# Patient Record
Sex: Female | Born: 1945 | Race: White | Hispanic: No | Smoking: Former smoker
Health system: Southern US, Community
[De-identification: ages and names within clinical notes are randomized; demographics above are authoritative.]

## PROBLEM LIST (undated history)

## (undated) DIAGNOSIS — I8310 Varicose veins of unspecified lower extremity with inflammation: Secondary | ICD-10-CM

## (undated) DIAGNOSIS — M8580 Other specified disorders of bone density and structure, unspecified site: Secondary | ICD-10-CM

## (undated) DIAGNOSIS — K219 Gastro-esophageal reflux disease without esophagitis: Secondary | ICD-10-CM

## (undated) DIAGNOSIS — T865 Complications of stem cell transplant: Secondary | ICD-10-CM

## (undated) DIAGNOSIS — R300 Dysuria: Secondary | ICD-10-CM

## (undated) DIAGNOSIS — R05 Cough: Secondary | ICD-10-CM

## (undated) DIAGNOSIS — C9000 Multiple myeloma not having achieved remission: Secondary | ICD-10-CM

## (undated) DIAGNOSIS — I6529 Occlusion and stenosis of unspecified carotid artery: Secondary | ICD-10-CM

## (undated) DIAGNOSIS — I10 Essential (primary) hypertension: Secondary | ICD-10-CM

## (undated) DIAGNOSIS — B1081 Human herpesvirus 6 infection: Secondary | ICD-10-CM

## (undated) DIAGNOSIS — M4802 Spinal stenosis, cervical region: Secondary | ICD-10-CM

## (undated) DIAGNOSIS — H3530 Unspecified macular degeneration: Secondary | ICD-10-CM

## (undated) DIAGNOSIS — M5090 Cervical disc disorder, unspecified, unspecified cervical region: Secondary | ICD-10-CM

## (undated) DIAGNOSIS — K648 Other hemorrhoids: Secondary | ICD-10-CM

## (undated) DIAGNOSIS — G5600 Carpal tunnel syndrome, unspecified upper limb: Secondary | ICD-10-CM

## (undated) DIAGNOSIS — D649 Anemia, unspecified: Secondary | ICD-10-CM

## (undated) DIAGNOSIS — G589 Mononeuropathy, unspecified: Secondary | ICD-10-CM

## (undated) DIAGNOSIS — H269 Unspecified cataract: Secondary | ICD-10-CM

## (undated) DIAGNOSIS — D469 Myelodysplastic syndrome, unspecified: Secondary | ICD-10-CM

## (undated) DIAGNOSIS — R5383 Other fatigue: Secondary | ICD-10-CM

## (undated) DIAGNOSIS — K644 Residual hemorrhoidal skin tags: Secondary | ICD-10-CM

## (undated) DIAGNOSIS — F419 Anxiety disorder, unspecified: Secondary | ICD-10-CM

## (undated) DIAGNOSIS — R509 Fever, unspecified: Secondary | ICD-10-CM

## (undated) DIAGNOSIS — B9689 Other specified bacterial agents as the cause of diseases classified elsewhere: Secondary | ICD-10-CM

## (undated) DIAGNOSIS — IMO0002 Reserved for concepts with insufficient information to code with codable children: Secondary | ICD-10-CM

## (undated) DIAGNOSIS — J329 Chronic sinusitis, unspecified: Secondary | ICD-10-CM

## (undated) DIAGNOSIS — Z5189 Encounter for other specified aftercare: Secondary | ICD-10-CM

## (undated) HISTORY — PX: BLADDER SUSPENSION: SHX72

## (undated) HISTORY — DX: Human herpesvirus 6 infection: B10.81

## (undated) HISTORY — PX: ANKLE SURGERY: SHX546

## (undated) HISTORY — PX: PORT-A-CATH REMOVAL: SHX5289

## (undated) HISTORY — DX: Fever, unspecified: R50.9

## (undated) HISTORY — DX: Unspecified cataract: H26.9

## (undated) HISTORY — DX: Unspecified macular degeneration: H35.30

## (undated) HISTORY — DX: Myelodysplastic syndrome, unspecified: D46.9

## (undated) HISTORY — DX: Carpal tunnel syndrome, unspecified upper limb: G56.00

## (undated) HISTORY — DX: Other hemorrhoids: K64.8

## (undated) HISTORY — PX: COLONOSCOPY: SHX174

## (undated) HISTORY — DX: Varicose veins of unspecified lower extremity with inflammation: I83.10

## (undated) HISTORY — DX: Cough: R05

## (undated) HISTORY — DX: Reserved for concepts with insufficient information to code with codable children: IMO0002

## (undated) HISTORY — DX: Essential (primary) hypertension: I10

## (undated) HISTORY — DX: Anemia, unspecified: D64.9

## (undated) HISTORY — DX: Spinal stenosis, cervical region: M48.02

## (undated) HISTORY — PX: LIMBAL STEM CELL TRANSPLANT: SHX1969

## (undated) HISTORY — DX: Cervical disc disorder, unspecified, unspecified cervical region: M50.90

## (undated) HISTORY — DX: Other hemorrhoids: K64.4

## (undated) HISTORY — DX: Other specified bacterial agents as the cause of diseases classified elsewhere: B96.89

## (undated) HISTORY — DX: Encounter for other specified aftercare: Z51.89

## (undated) HISTORY — DX: Gastro-esophageal reflux disease without esophagitis: K21.9

## (undated) HISTORY — DX: Residual hemorrhoidal skin tags: K64.8

## (undated) HISTORY — PX: UPPER GASTROINTESTINAL ENDOSCOPY: SHX188

## (undated) HISTORY — DX: Other specified disorders of bone density and structure, unspecified site: M85.80

## (undated) HISTORY — DX: Anxiety disorder, unspecified: F41.9

## (undated) HISTORY — DX: Occlusion and stenosis of unspecified carotid artery: I65.29

## (undated) HISTORY — DX: Mononeuropathy, unspecified: G58.9

## (undated) HISTORY — DX: Other fatigue: R53.83

## (undated) HISTORY — PX: ESOPHAGOGASTRODUODENOSCOPY: SHX1529

## (undated) HISTORY — DX: Chronic sinusitis, unspecified: J32.9

## (undated) HISTORY — DX: Multiple myeloma not having achieved remission: C90.00

## (undated) HISTORY — DX: Dysuria: R30.0

## (undated) MED FILL — Dexamethasone Sodium Phosphate Inj 100 MG/10ML: INTRAMUSCULAR | Qty: 2 | Status: AC

---

## 2009-07-29 ENCOUNTER — Ambulatory Visit: Payer: Self-pay | Admitting: Unknown Physician Specialty

## 2009-10-21 ENCOUNTER — Ambulatory Visit: Payer: Self-pay | Admitting: Gastroenterology

## 2010-04-19 LAB — HM COLONOSCOPY: HM Colonoscopy: NORMAL

## 2011-11-03 ENCOUNTER — Ambulatory Visit: Payer: Self-pay | Admitting: Orthopedic Surgery

## 2012-01-18 LAB — HM PAP SMEAR: HM Pap smear: NORMAL

## 2012-01-18 LAB — HM MAMMOGRAPHY: HM Mammogram: NORMAL

## 2012-03-13 ENCOUNTER — Ambulatory Visit: Payer: Self-pay | Admitting: Internal Medicine

## 2012-03-26 ENCOUNTER — Encounter: Payer: Self-pay | Admitting: *Deleted

## 2012-03-29 ENCOUNTER — Encounter: Payer: Self-pay | Admitting: Cardiovascular Disease

## 2012-03-29 ENCOUNTER — Ambulatory Visit (INDEPENDENT_AMBULATORY_CARE_PROVIDER_SITE_OTHER): Payer: Medicare HMO | Admitting: Cardiovascular Disease

## 2012-03-29 VITALS — BP 148/78 | HR 74 | Ht 63.0 in | Wt 160.0 lb

## 2012-03-29 DIAGNOSIS — R030 Elevated blood-pressure reading, without diagnosis of hypertension: Secondary | ICD-10-CM

## 2012-03-29 DIAGNOSIS — R002 Palpitations: Secondary | ICD-10-CM

## 2012-03-29 DIAGNOSIS — R0602 Shortness of breath: Secondary | ICD-10-CM

## 2012-03-29 DIAGNOSIS — IMO0001 Reserved for inherently not codable concepts without codable children: Secondary | ICD-10-CM | POA: Insufficient documentation

## 2012-03-29 NOTE — Assessment & Plan Note (Signed)
Continue to monitor. I will consider treatment based upon followup blood pressure.

## 2012-03-29 NOTE — Assessment & Plan Note (Signed)
The patient reports increased exertional dyspnea without chest pain. Due to her family history of coronary artery disease, I recommend ischemic cardiac evaluation. Baseline ECG is normal. Thus, I recommend a treadmill stress test. Due to palpitations and dyspnea, I would also obtain an echocardiogram.

## 2012-03-29 NOTE — Patient Instructions (Addendum)
Your physician has requested that you have an echocardiogram. Echocardiography is a painless test that uses sound waves to create images of your heart. It provides your doctor with information about the size and shape of your heart and how well your heart's chambers and valves are working. This procedure takes approximately one hour. There are no restrictions for this procedure.  Your physician has requested that you have an exercise tolerance test. For further information please visit [email or web address]. Please also follow instruction sheet, as given.  Follow up after tests.  

## 2012-03-29 NOTE — Progress Notes (Signed)
HPI  This is a 66 year old female who is self-referred for evaluation of agitation and dyspnea. Her primary care physician is Dr. Arlana Pouch. She reports  family history of premature coronary artery disease. She is not aware of any previous cardiac problems. She has been having slightly elevated blood pressure readings recently. She has known hyperlipidemia. However, her HDL is borderline low at 40 with elevated LDL-P particles. Atorvastatin was suggested but the patient has been hesitant to start treatment. She has been under significant stress recently. Since then, the patient had increased symptoms of palpitations described as skipping of the heart. She has cut down on caffeine intake since then with slight improvement. She had a Holter monitor done which showed one PVC as well as few PACs. No other arrhythmia. She reports increased exertional dyspnea. She also has chronic back discomfort. She has occasional heartburn. She denies any chest tightness. She does not exercise on a regular basis.  Allergies  Allergen Reactions  . Penicillins   . Sulfa Antibiotics      Current Outpatient Prescriptions on File Prior to Visit  Medication Sig Dispense Refill  . LORazepam (ATIVAN) 0.5 MG tablet Take 0.5 mg by mouth as needed.      . Omeprazole (PRILOSEC PO) Take by mouth as needed.      Marland Kitchen atorvastatin (LIPITOR) 10 MG tablet Take 10 mg by mouth daily.         Past Medical History  Diagnosis Date  . Cervical disc disease   . Spinal stenosis in cervical region   . Carpal tunnel syndrome   . Pinched nerve   . Varicose veins of lower extremities with inflammation   . Disc degeneration   . Hypertension     Borderline.  . Carotid stenosis     Mild bilateral     Past Surgical History  Procedure Date  . Colonoscopy   . Bladder tact     x2     Family History  Problem Relation Age of Onset  . Heart disease Mother   . Heart failure Father   . Heart disease Father   . Hypertension Brother     . Heart disease Brother      History   Social History  . Marital Status: Divorced    Spouse Name: N/A    Number of Children: N/A  . Years of Education: N/A   Occupational History  . Not on file.   Social History Main Topics  . Smoking status: Former Smoker -- 0.5 packs/day for 4 years  . Smokeless tobacco: Not on file  . Alcohol Use: No  . Drug Use: No  . Sexually Active:    Other Topics Concern  . Not on file   Social History Narrative  . No narrative on file     ROS Constitutional: Negative for fever, chills, diaphoresis, activity change, appetite change and fatigue.  HENT: Negative for hearing loss, nosebleeds, congestion, sore throat, facial swelling, drooling, trouble swallowing, neck pain, voice change, sinus pressure and tinnitus.  Eyes: Negative for photophobia, pain, discharge and visual disturbance.  Respiratory: Negative for apnea, cough, chest tightness and wheezing.  Cardiovascular: Negative for chest pain and leg swelling.  Gastrointestinal: Negative for nausea, vomiting, abdominal pain, diarrhea, constipation, blood in stool and abdominal distention.  Genitourinary: Negative for dysuria, urgency, frequency, hematuria and decreased urine volume.  Musculoskeletal: Negative for myalgias, back pain, joint swelling, arthralgias and gait problem.  Skin: Negative for color change, pallor, rash and wound.  Neurological: Negative for dizziness, tremors, seizures, syncope, speech difficulty, weakness, light-headedness, numbness and headaches.  Psychiatric/Behavioral: Negative for suicidal ideas, hallucinations, behavioral problems and agitation. The patient is nervous/anxious.     PHYSICAL EXAM   BP 148/78  Pulse 74  Ht 5\' 3"  (1.6 m)  Wt 167 lb (75.751 kg)  BMI 29.58 kg/m2 Constitutional: She is oriented to person, place, and time. She appears well-developed and well-nourished. No distress.  HENT: No nasal discharge.  Head: Normocephalic and atraumatic.   Eyes: Pupils are equal and round. Right eye exhibits no discharge. Left eye exhibits no discharge.  Neck: Normal range of motion. Neck supple. No JVD present. No thyromegaly present.  Cardiovascular: Normal rate, regular rhythm, normal heart sounds. Exam reveals no gallop and no friction rub. No murmur heard.  Pulmonary/Chest: Effort normal and breath sounds normal. No stridor. No respiratory distress. She has no wheezes. She has no rales. She exhibits no tenderness.  Abdominal: Soft. Bowel sounds are normal. She exhibits no distension. There is no tenderness. There is no rebound and no guarding.  Musculoskeletal: Normal range of motion. She exhibits no edema and no tenderness.  Neurological: She is alert and oriented to person, place, and time. Coordination normal.  Skin: Skin is warm and dry. No rash noted. She is not diaphoretic. No erythema. No pallor.  Psychiatric: She has a normal mood and affect. Her behavior is normal. Judgment and thought content normal.     EKG: Sinus  Rhythm  WITHIN NORMAL LIMITS   ASSESSMENT AND PLAN

## 2012-03-29 NOTE — Assessment & Plan Note (Signed)
Her symptoms are suggestive of premature beats. These improved after she cut down on caffeine intake. I suspect that these are mostly triggered by stress and anxiety. I will consider treatment with a small dose beta blocker if her blood pressure remains elevated.

## 2012-04-01 ENCOUNTER — Other Ambulatory Visit (INDEPENDENT_AMBULATORY_CARE_PROVIDER_SITE_OTHER): Payer: Medicare HMO

## 2012-04-01 ENCOUNTER — Other Ambulatory Visit: Payer: Self-pay

## 2012-04-01 DIAGNOSIS — R0602 Shortness of breath: Secondary | ICD-10-CM

## 2012-04-08 ENCOUNTER — Telehealth: Payer: Self-pay | Admitting: Cardiovascular Disease

## 2012-04-08 NOTE — Telephone Encounter (Signed)
Pt would like to know results for her ECHO she had done 04/01/12.  Please call to advise.

## 2012-04-08 NOTE — Telephone Encounter (Signed)
Preliminary results given to pt. I explained Dr. Kirke Corin still needs to review then I will let her know if he needs to make any changes. Understanding verb.

## 2012-04-08 NOTE — Telephone Encounter (Signed)
Normal echo.

## 2012-04-08 NOTE — Telephone Encounter (Signed)
Do you mind reviewing and I will call pt with results? Thanks!

## 2012-04-12 ENCOUNTER — Ambulatory Visit: Payer: Self-pay | Admitting: Internal Medicine

## 2012-04-15 ENCOUNTER — Ambulatory Visit (INDEPENDENT_AMBULATORY_CARE_PROVIDER_SITE_OTHER): Payer: Medicare HMO | Admitting: Cardiovascular Disease

## 2012-04-15 ENCOUNTER — Encounter: Payer: Self-pay | Admitting: Cardiovascular Disease

## 2012-04-15 VITALS — BP 142/80 | HR 90 | Ht 63.0 in | Wt 153.2 lb

## 2012-04-15 VITALS — BP 142/80 | HR 90 | Ht 63.0 in | Wt 153.0 lb

## 2012-04-15 DIAGNOSIS — R0602 Shortness of breath: Secondary | ICD-10-CM

## 2012-04-15 DIAGNOSIS — I10 Essential (primary) hypertension: Secondary | ICD-10-CM | POA: Insufficient documentation

## 2012-04-15 DIAGNOSIS — R002 Palpitations: Secondary | ICD-10-CM

## 2012-04-15 MED ORDER — CARVEDILOL 3.125 MG PO TABS: 3.1250 mg | ORAL_TABLET | Freq: Two times a day (BID) | ORAL | Status: DC

## 2012-04-15 NOTE — Procedures (Signed)
    Treadmill Stress test  Indication:  Dyspnea and palpitations  Baseline Data:  Resting EKG shows NSR with rate of 90 bpm, no significant ST or T wave changes. Resting blood pressure of 142/80 mm Hg Stand bruce protocal was used.  Exercise Data:  Patient exercised for 5 min 0 sec,  Peak heart rate of 160 bpm.  This was 103 % of the maximum predicted heart rate. No symptoms of chest pain or lightheadedness were reported at peak stress or in recovery.  Peak Blood pressure recorded was 178/76 Maximal work level: 7 METs.  Heart rate at 3 minutes in recovery was 109 bpm. BP response: Normal HR response: Accelerated.  EKG with Exercise: Sinus tachycardia with no significant ST changes.  FINAL IMPRESSION: Normal exercise stress test. No significant EKG changes concerning for ischemia. Fair exercise tolerance.

## 2012-04-15 NOTE — Assessment & Plan Note (Signed)
Likely was due to premature beats. Her symptoms overall improved. She will be started on a small dose beta blocker for her hypertension which should help with this as well.

## 2012-04-15 NOTE — Patient Instructions (Signed)
Your stress test is normal.

## 2012-04-15 NOTE — Progress Notes (Signed)
HPI  This is a 66 year old female who is here today for a followup visit regarding palpitations and dyspnea. Her primary care physician was Dr. Arlana Pouch but she is switching to Dr. Darrick Huntsman. She reports  family history of premature coronary artery disease. She is not aware of any previous cardiac problems. She has been having slightly elevated blood pressure readings recently. She has known hyperlipidemia. However, her HDL is borderline low at 40 with elevated LDL-P particles. Atorvastatin was suggested but the patient has been hesitant to start treatment. She has been under significant stress recently. Since then, the patient had increased symptoms of palpitations described as skipping of the heart. She has cut down on caffeine intake since then with slight improvement. She had a Holter monitor done which showed one PVC as well as few PACs. She also reported increased exertional dyspnea without chest pain. She underwent cardiac evaluation with an echocardiogram overall was unremarkable. She underwent a treadmill stress test today and was normal. She has been recording her blood pressure at home and most of the readings are above 140 systolic to 150.   Allergies  Allergen Reactions  . Penicillins   . Sulfa Antibiotics      Current Outpatient Prescriptions on File Prior to Visit  Medication Sig Dispense Refill  . aspirin 81 MG tablet Take 81 mg by mouth daily.      . Cyanocobalamin (B-12) 1000 MCG CAPS Take by mouth daily.      . diclofenac (VOLTAREN) 75 MG EC tablet Take 75 mg by mouth as needed.      Marland Kitchen FIBER PO Take by mouth as needed.      Marland Kitchen LORazepam (ATIVAN) 0.5 MG tablet Take 0.5 mg by mouth as needed.      . Multiple Vitamins-Minerals (CENTRUM SILVER ULTRA WOMENS PO) Take by mouth daily.      . Omeprazole (PRILOSEC PO) Take by mouth as needed.      . carvedilol (COREG) 3.125 MG tablet Take 1 tablet (3.125 mg total) by mouth 2 (two) times daily.  60 tablet  6     Past Medical History    Diagnosis Date  . Cervical disc disease   . Spinal stenosis in cervical region   . Carpal tunnel syndrome   . Pinched nerve   . Varicose veins of lower extremities with inflammation   . Disc degeneration   . Hypertension     Borderline.  . Carotid stenosis     Mild bilateral     Past Surgical History  Procedure Date  . Colonoscopy   . Bladder tact     x2     Family History  Problem Relation Age of Onset  . Heart disease Mother   . Heart failure Father   . Heart disease Father   . Hypertension Brother   . Heart disease Brother      History   Social History  . Marital Status: Divorced    Spouse Name: N/A    Number of Children: N/A  . Years of Education: N/A   Occupational History  . Not on file.   Social History Main Topics  . Smoking status: Former Smoker -- 0.5 packs/day for 4 years  . Smokeless tobacco: Not on file  . Alcohol Use: No  . Drug Use: No  . Sexually Active:    Other Topics Concern  . Not on file   Social History Narrative  . No narrative on file  PHYSICAL EXAM   BP 142/80  Pulse 90  Ht 5\' 3"  (1.6 m)  Wt 153 lb 4 oz (69.514 kg)  BMI 27.15 kg/m2 Constitutional: She is oriented to person, place, and time. She appears well-developed and well-nourished. No distress.  HENT: No nasal discharge.  Head: Normocephalic and atraumatic.  Eyes: Pupils are equal and round. Right eye exhibits no discharge. Left eye exhibits no discharge.  Neck: Normal range of motion. Neck supple. No JVD present. No thyromegaly present.  Cardiovascular: Normal rate, regular rhythm, normal heart sounds. Exam reveals no gallop and no friction rub. No murmur heard.  Pulmonary/Chest: Effort normal and breath sounds normal. No stridor. No respiratory distress. She has no wheezes. She has no rales. She exhibits no tenderness.  Abdominal: Soft. Bowel sounds are normal. She exhibits no distension. There is no tenderness. There is no rebound and no guarding.   Musculoskeletal: Normal range of motion. She exhibits no edema and no tenderness.  Neurological: She is alert and oriented to person, place, and time. Coordination normal.  Skin: Skin is warm and dry. No rash noted. She is not diaphoretic. No erythema. No pallor.  Psychiatric: She has a normal mood and affect. Her behavior is normal. Judgment and thought content normal.      ASSESSMENT AND PLAN

## 2012-04-15 NOTE — Assessment & Plan Note (Signed)
Her cardiac workup has been unremarkable. Echocardiogram was normal and treadmill stress test today showed no evidence of ischemia. She did have accelerated heart rate response to exercise indicating cardiac deconditioning. I advised her to continue with exercise program.

## 2012-04-15 NOTE — Assessment & Plan Note (Addendum)
The patient likely has essential hypertension. She had multiple readings at home about 140 systolic. I will start her on small dose carvedilol 3.125 mg twice daily. I chose a beta blocker due to her recent palpitations and premature beats. She is to followup with [name] as needed.

## 2012-04-15 NOTE — Patient Instructions (Addendum)
Start Carvedilol 3.125 mg twice daily.  Follow up as needed.

## 2012-04-16 ENCOUNTER — Ambulatory Visit: Payer: Medicare HMO | Admitting: Cardiovascular Disease

## 2012-04-19 ENCOUNTER — Ambulatory Visit (INDEPENDENT_AMBULATORY_CARE_PROVIDER_SITE_OTHER): Payer: Medicare HMO | Admitting: Internal Medicine

## 2012-04-19 ENCOUNTER — Encounter: Payer: Self-pay | Admitting: Internal Medicine

## 2012-04-19 VITALS — BP 140/78 | HR 107 | Temp 98.2°F | Ht 63.0 in | Wt 161.2 lb

## 2012-04-19 DIAGNOSIS — Z23 Encounter for immunization: Secondary | ICD-10-CM

## 2012-04-19 DIAGNOSIS — M5090 Cervical disc disorder, unspecified, unspecified cervical region: Secondary | ICD-10-CM

## 2012-04-19 DIAGNOSIS — E538 Deficiency of other specified B group vitamins: Secondary | ICD-10-CM

## 2012-04-19 DIAGNOSIS — E559 Vitamin D deficiency, unspecified: Secondary | ICD-10-CM

## 2012-04-19 DIAGNOSIS — I10 Essential (primary) hypertension: Secondary | ICD-10-CM

## 2012-04-19 DIAGNOSIS — I6529 Occlusion and stenosis of unspecified carotid artery: Secondary | ICD-10-CM

## 2012-04-19 DIAGNOSIS — R0602 Shortness of breath: Secondary | ICD-10-CM

## 2012-04-19 LAB — VITAMIN B12: Vitamin B-12: 708 pg/mL (ref 211–911)

## 2012-04-19 MED ORDER — ZOSTER VACCINE LIVE 19400 UNT/0.65ML ~~LOC~~ SOLR: 0.6500 mL | Freq: Once | SUBCUTANEOUS | Status: DC

## 2012-04-19 NOTE — Patient Instructions (Addendum)
I am checking your Vit d and b12 today  Read about the mediterranean diet to raise HDL  Avoid over combination of caffeine and alcohol to prevent arrhythmias  Call if the carvedilol causes fogginess  1200 mg calcium,  1000 units vit d goal

## 2012-04-19 NOTE — Progress Notes (Signed)
Patient ID: Jean Davidson, female   DOB: 06/15/1946, 66 y.o.   MRN: [id number] Patient Active Problem List  Diagnosis  . SOB (shortness of breath)  . Palpitations  . Hypertension  . Carotid stenosis  . Cervical disc disease    Subjective:  CC:   Chief Complaint  Patient presents with  . Establish Care    HPI:   Jean Davidson is a 66 y.o. female who presents as a new patient to establish primary care with the chief complaint of  1)  Palpitations and dyspnea,  Saw Dr.  Kirke Corin last week and her evaluation last week included stress test and ECHO which were both normal . 2) need for new primary care,  She is dissatisfied with former PCP because he did not perform a physical exam on her at her last visit as requested.  3)Overweight.  She has addressed weight in the past  And But recently regained and lost 10 lbs.  She is exercising regularly at [employer]. She is stressed out be her daughter's 5 yr engagement breakup. 4) PAD with  Family history .  Has mild PAD by ultrasound of carotids.   Done annually by Dr  Wyn Quaker. LDL 73 August 2012.   Due for annual in Feb.   5) Cervical spinal stenosis C5-C7, due to bulging disk.   Occasional tingling bilateral hands if she tilts head back such as when seated in dentist chair  Or lying supine at night.  Symptoms have improved after completing PT  At [facility].  New pillow is also helping.  6) OSA: Sleep study  Showed upper airway resistance syndrome, minimal OSA  Done due to snoring and insomnia and daytime somnolence    Health Maintenance : She had a DEXA in 2011. Previously took HRT , which was stopped when had breast engorgement on the right,  which resolved.  .    .    7) History of broken collarbone ran into door. history of toe fracture , hard cast.   No surgery.  Just braces.,    Past Medical History  Diagnosis Date  . Cervical disc disease     c7  . Spinal stenosis in cervical region   . Carpal tunnel syndrome   . Pinched nerve   . Varicose veins  of lower extremities with inflammation   . Disc degeneration   . Hypertension     Borderline.  . Carotid stenosis     Mild bilateral    Past Surgical History  Procedure Date  . Colonoscopy   . Bladder tact     x2    Family History  Problem Relation Age of Onset  . Heart disease Mother   . Heart failure Father   . Heart disease Father   . Hypertension Brother   . Heart disease Brother     congenital shunt, ?     History   Social History  . Marital Status: Divorced    Spouse Name: N/A    Number of Children: N/A  . Years of Education: N/A   Occupational History  . Not on file.   Social History Main Topics  . Smoking status: Former Smoker -- 0.5 packs/day for 4 years  . Smokeless tobacco: Not on file  . Alcohol Use: No  . Drug Use: No  . Sexually Active:    Other Topics Concern  . Not on file   Social History Narrative  . No narrative on file         @  ZOXWR@    Review of Systems:   The remainder of the review of systems was negative except those addressed in the HPI.       Objective:  BP 140/78  Pulse 107  Temp 98.2 F (36.8 C) (Oral)  Ht 5\' 3"  (1.6 m)  Wt 161 lb 4 oz (73.143 kg)  BMI 28.56 kg/m2  SpO2 97%  General appearance: alert, cooperative and appears stated age Ears: normal TM's and external ear canals both ears Throat: lips, mucosa, and tongue normal; teeth and gums normal Neck: no adenopathy, no carotid bruit, supple, symmetrical, trachea midline and thyroid not enlarged, symmetric, no tenderness/mass/nodules Back: symmetric, no curvature. ROM normal. No CVA tenderness. Lungs: clear to auscultation bilaterally Heart: regular rate and rhythm, S1, S2 normal, no murmur, click, rub or gallop Abdomen: soft, non-tender; bowel sounds normal; no masses,  no organomegaly Pulses: 2+ and symmetric Skin: Skin color, texture, turgor normal. No rashes or lesions Lymph nodes: Cervical, supraclavicular, and axillary nodes  normal.  Assessment and Plan:  Cervical disc disease Symptoms are currently controlled with physical therapy and orthopedic pillow.  Carotid stenosis Management aspirin controlled lipids an annual carotid ultrasound by Dr. dew.  Hypertension New diagnosis with 4 positive readings. Managed concurrently with palpitations with low-dose carvedilol which was prescribed by Dr. Kirke Corin last week.  SOB (shortness of breath) Secondary to deconditioning. She is no signs of coronary artery disease a recent cardiology workup.   Updated Medication List Outpatient Encounter Prescriptions as of 04/19/2012  Medication Sig Dispense Refill  . aspirin 81 MG tablet Take 81 mg by mouth daily.      . Cyanocobalamin (B-12) 1000 MCG CAPS Take by mouth daily.      . diclofenac (VOLTAREN) 75 MG EC tablet Take 75 mg by mouth as needed.      Marland Kitchen FIBER PO Take by mouth as needed.      Marland Kitchen LORazepam (ATIVAN) 0.5 MG tablet Take 0.5 mg by mouth as needed.      . Multiple Vitamins-Minerals (CENTRUM SILVER ULTRA WOMENS PO) Take by mouth daily.      . Omeprazole (PRILOSEC PO) Take by mouth as needed.      . carvedilol (COREG) 3.125 MG tablet Take 1 tablet (3.125 mg total) by mouth 2 (two) times daily.  60 tablet  6  . zoster vaccine live, PF, (ZOSTAVAX) 60454 UNT/0.65ML injection Inject 19,400 Units into the skin once.  1 vial  0     Orders Placed This Encounter  Procedures  . HM MAMMOGRAPHY  . Pneumococcal polysaccharide vaccine 23-valent greater than or equal to 2yo subcutaneous/IM  . HM PAP SMEAR  . Vitamin D 25 hydroxy  . Vitamin B12  . HM COLONOSCOPY    Return in about 3 months (around 07/20/2012).

## 2012-04-20 LAB — VITAMIN D 25 HYDROXY (VIT D DEFICIENCY, FRACTURES): Vit D, 25-Hydroxy: 26 ng/mL — ABNORMAL LOW (ref 30–89)

## 2012-04-21 ENCOUNTER — Encounter: Payer: Self-pay | Admitting: Internal Medicine

## 2012-04-21 DIAGNOSIS — I6529 Occlusion and stenosis of unspecified carotid artery: Secondary | ICD-10-CM | POA: Insufficient documentation

## 2012-04-21 DIAGNOSIS — M5090 Cervical disc disorder, unspecified, unspecified cervical region: Secondary | ICD-10-CM | POA: Insufficient documentation

## 2012-04-21 NOTE — Assessment & Plan Note (Signed)
Symptoms are currently controlled with physical therapy and orthopedic pillow.

## 2012-04-21 NOTE — Assessment & Plan Note (Signed)
Secondary to deconditioning. She is no signs of coronary artery disease a recent cardiology workup.

## 2012-04-21 NOTE — Assessment & Plan Note (Signed)
Management aspirin controlled lipids an annual carotid ultrasound by Dr. dew.

## 2012-04-21 NOTE — Assessment & Plan Note (Addendum)
New diagnosis with 4 positive readings. Managed concurrently with palpitations with low-dose carvedilol which was prescribed by Dr. Kirke Corin last week.

## 2012-05-21 ENCOUNTER — Ambulatory Visit: Payer: Self-pay | Admitting: Internal Medicine

## 2012-07-23 ENCOUNTER — Other Ambulatory Visit: Payer: Self-pay | Admitting: *Deleted

## 2012-07-23 NOTE — Telephone Encounter (Signed)
Pt needs 90 day sent to mail order. Also wants to know what kind of decongestant she can take over the counter with high BP.

## 2012-07-25 ENCOUNTER — Telehealth: Payer: Self-pay | Admitting: *Deleted

## 2012-07-25 NOTE — Telephone Encounter (Signed)
Pt wants to know what type of decongestant she can take with high BP

## 2012-07-25 NOTE — Telephone Encounter (Signed)
Suggested Mucinex (not Mucinex D/DM, etc)or coricedin OTC

## 2012-08-02 ENCOUNTER — Other Ambulatory Visit: Payer: Self-pay | Admitting: *Deleted

## 2012-08-02 NOTE — Telephone Encounter (Signed)
90 day sent to [facility]. 

## 2012-08-10 ENCOUNTER — Other Ambulatory Visit: Payer: Self-pay

## 2012-08-19 ENCOUNTER — Telehealth: Payer: Self-pay | Admitting: *Deleted

## 2012-08-19 MED ORDER — CARVEDILOL 6.25 MG PO TABS: 6.2500 mg | ORAL_TABLET | Freq: Two times a day (BID) | ORAL | Status: DC

## 2012-08-19 NOTE — Telephone Encounter (Signed)
Pt has questions concerning her medications and her bp. Pt also needs 90 script sent to [facility] for her carvedilol 3.125mg 

## 2012-08-19 NOTE — Telephone Encounter (Signed)
Pt wanted to know estimated "target" for her BP I advised </= 130/70 is a good target She reports BPs have been consistently elevated at 142/89,144/86,142/70 She is taking coreg 3.125 mg BID as prescribed, tolerating this well I advised her to try increasing this to 6.25 mg in am, 3.125 mg ion PM She will try this  Also needs refill sent to pharmacy for 90 RX Will send in coreg 6.25 mg PO BID but she is aware to take as we discussed above

## 2012-11-26 ENCOUNTER — Ambulatory Visit: Payer: Medicare HMO | Admitting: Adult Health

## 2013-01-16 ENCOUNTER — Ambulatory Visit (INDEPENDENT_AMBULATORY_CARE_PROVIDER_SITE_OTHER): Payer: Medicare HMO | Admitting: Family Medicine

## 2013-01-16 ENCOUNTER — Encounter: Payer: Self-pay | Admitting: Family Medicine

## 2013-01-16 VITALS — BP 132/80 | HR 74 | Temp 98.6°F | Ht 63.0 in | Wt 163.8 lb

## 2013-01-16 DIAGNOSIS — K219 Gastro-esophageal reflux disease without esophagitis: Secondary | ICD-10-CM | POA: Insufficient documentation

## 2013-01-16 DIAGNOSIS — G4700 Insomnia, unspecified: Secondary | ICD-10-CM

## 2013-01-16 DIAGNOSIS — I658 Occlusion and stenosis of other precerebral arteries: Secondary | ICD-10-CM

## 2013-01-16 DIAGNOSIS — M5090 Cervical disc disorder, unspecified, unspecified cervical region: Secondary | ICD-10-CM

## 2013-01-16 DIAGNOSIS — I6529 Occlusion and stenosis of unspecified carotid artery: Secondary | ICD-10-CM

## 2013-01-16 DIAGNOSIS — R002 Palpitations: Secondary | ICD-10-CM

## 2013-01-16 DIAGNOSIS — I6523 Occlusion and stenosis of bilateral carotid arteries: Secondary | ICD-10-CM

## 2013-01-16 DIAGNOSIS — I10 Essential (primary) hypertension: Secondary | ICD-10-CM

## 2013-01-16 MED ORDER — DICLOFENAC SODIUM 75 MG PO TBEC: 75.0000 mg | DELAYED_RELEASE_TABLET | Freq: Two times a day (BID) | ORAL | Status: DC

## 2013-01-16 NOTE — Assessment & Plan Note (Signed)
Well controlled. Continue current medication.  

## 2013-01-16 NOTE — Progress Notes (Signed)
  Subjective:    Patient ID: Jean Davidson, female    DOB: 06-07-1946, 67 y.o.   MRN: [id number]  HPI 67 year old female presents to establish.  Previous MD Dr. Darrick Huntsman.  Sees [facility]... nml pelvic, breast exam and pap smear.   She has noted tingling in both hands  in last month. Greater in left. Occ pain in left bicep,forearm and hand. No known weakness in arms or hands, no grip strength.  She has history of  cervical spinal stenosis and B foraminal narrowing, Dx in 2013 Diclofenac helped some with pain in past.. She is out.  Last year eval with palpitations. Saw Dr. Kirke Corin and had holter monitor placed: Showing PAC and 1 PVC  ECHo nml, stress test nml  Palpitations controlled on BBlocker low dose. Mother CAD age 52s Father CAD  Age 56 Brother with shunt   Hypertension:  Well controlled on coreg Using medication without problems or lightheadedness:  None Chest pain with exertion:Noine Edema:None Short of breath:None Average home BPs: not checking Other issues:  GERD: Symptoms occuring 2 time a week. On no PPI.  Review of Systems  Constitutional: Negative for fever and fatigue.  HENT: Negative for ear pain.   Eyes: Negative for pain.  Respiratory: Negative for chest tightness and shortness of breath.   Cardiovascular: Negative for chest pain, palpitations and leg swelling.  Gastrointestinal: Negative for abdominal pain.  Genitourinary: Negative for dysuria.  Musculoskeletal:       Pain in B calfs with standing, not with walking  Skin:       Multiple skin tags, wart on left 1st digit.  Psychiatric/Behavioral:       Insomnia       Objective:   Physical Exam        Assessment & Plan:

## 2013-01-16 NOTE — Assessment & Plan Note (Signed)
Likely causing numbness and ? Atrophy of thenar prominence.  Restart diclofenac ( pt refused prednisone) Refer back to  Back specialist for ESI vs surgery given severity of symptoms.

## 2013-01-16 NOTE — Patient Instructions (Addendum)
Start diclofenac twice daily.   Decrease reflux triggers. Decrease or stop caffeine. Can try prilosec 40 mg daily x 4- 6 weeks... Then taper.  Schedule medicare wellness with fasting labs prior in 04/2013. Follow up with ortho for neck issue.

## 2013-01-24 ENCOUNTER — Ambulatory Visit (INDEPENDENT_AMBULATORY_CARE_PROVIDER_SITE_OTHER): Payer: Medicare HMO | Admitting: Family Medicine

## 2013-01-24 ENCOUNTER — Encounter: Payer: Self-pay | Admitting: Family Medicine

## 2013-01-24 VITALS — BP 120/80 | HR 69 | Temp 97.9°F | Wt 165.0 lb

## 2013-01-24 DIAGNOSIS — B078 Other viral warts: Secondary | ICD-10-CM

## 2013-01-24 DIAGNOSIS — B079 Viral wart, unspecified: Secondary | ICD-10-CM

## 2013-01-24 DIAGNOSIS — L909 Atrophic disorder of skin, unspecified: Secondary | ICD-10-CM

## 2013-01-24 DIAGNOSIS — L089 Local infection of the skin and subcutaneous tissue, unspecified: Secondary | ICD-10-CM

## 2013-01-24 DIAGNOSIS — L918 Other hypertrophic disorders of the skin: Secondary | ICD-10-CM

## 2013-01-24 NOTE — Progress Notes (Signed)
  Subjective:    Patient ID: Jean Davidson, female    DOB: 01-13-1946, 67 y.o.   MRN: [id number]  HPI  67 year old femlae with waryt on right 1st finger. 2 skin tags under right eye, rubbed and irritated by glasses.  Tender at times, no bleeding.   Review of Systems  Constitutional: Negative for fever.  Respiratory: Negative for shortness of breath.   Cardiovascular: Negative for chest pain.       Objective:   Physical Exam  Skin:  Irritated skin tags at right lateral eye lid  Large 1 cm long wart on right 1st distal finger.          Assessment & Plan:  Procedure Note: 2 skin tags treated with cryotherapy through cone 3 cycles, minimal halo given location. No complication and no issues with pt vision following procedure.  Verruca on right finger distal... Treated aggressively with cryotherapy 3 mm halo , 3 cycles extended.

## 2013-02-03 ENCOUNTER — Telehealth: Payer: Self-pay

## 2013-02-03 NOTE — Telephone Encounter (Signed)
Pt left v/m wanting Dr Ermalene Searing to recommend a spinal surgeon for down the road based on orthopedic f/u.Please advise.

## 2013-02-04 NOTE — Telephone Encounter (Signed)
She does not want referral? Recommend Ophelia Charter, [facility], [facility], [place], etc.

## 2013-02-04 NOTE — Telephone Encounter (Signed)
Advised patient as instructed. 

## 2013-03-31 ENCOUNTER — Telehealth: Payer: Self-pay | Admitting: Family Medicine

## 2013-03-31 ENCOUNTER — Other Ambulatory Visit: Payer: Self-pay | Admitting: Family Medicine

## 2013-03-31 NOTE — Telephone Encounter (Signed)
Pt has upcoming medicare wellness exam 05/02/13; pt wants to know if can have a second application to remove warts on finger and face or will pt need to schedule a separate appt for that.( 12/2012 pt received first application to remove warts on rt index finger and under eye; warts are reduced but not gone.) Pt request cb. Pt also request records from Dr Kirke Corin before seeing neurosurgeon this week. Advised pt would need to contact Dr Kirke Corin for those records; pt voiced understanding.

## 2013-04-01 NOTE — Telephone Encounter (Signed)
Can do at appt as long as not having other new problems to discuss.

## 2013-04-01 NOTE — Telephone Encounter (Signed)
Left message for patient as instructed.  Jean Davidson

## 2013-04-06 HISTORY — PX: SHOULDER SURGERY: SHX246

## 2013-04-08 ENCOUNTER — Ambulatory Visit: Payer: Medicare HMO | Admitting: Family Medicine

## 2013-04-23 ENCOUNTER — Telehealth: Payer: Self-pay

## 2013-04-23 DIAGNOSIS — Z96619 Presence of unspecified artificial shoulder joint: Secondary | ICD-10-CM

## 2013-04-23 NOTE — Telephone Encounter (Signed)
Pt left v/m; pt is presently in [place] and had shoulder replacement surgery while in [place]; pt will be returning to [place] early November and will need referral to orthopedist for f/u and PT. Pt request referral in [place] or this side of [place].Please advise.

## 2013-04-24 NOTE — Telephone Encounter (Signed)
Pt left v/m requesting update on referrals.

## 2013-04-24 NOTE — Telephone Encounter (Signed)
Referrals sent

## 2013-04-25 NOTE — Telephone Encounter (Signed)
Pt request call before scheduling appts. Wants to discuss who pt will be seeing, checking on ins. Approval and transportation issues.

## 2013-04-28 ENCOUNTER — Other Ambulatory Visit: Payer: Medicare HMO

## 2013-05-01 ENCOUNTER — Other Ambulatory Visit: Payer: Self-pay

## 2013-05-02 ENCOUNTER — Encounter: Payer: Medicare HMO | Admitting: Family Medicine

## 2013-07-24 ENCOUNTER — Telehealth: Payer: Self-pay

## 2013-07-24 DIAGNOSIS — E2839 Other primary ovarian failure: Secondary | ICD-10-CM

## 2013-07-24 NOTE — Telephone Encounter (Signed)
Referral sent 

## 2013-07-24 NOTE — Telephone Encounter (Signed)
Pt left v/m; pt recently had fx and shoulder replacement; pt thinks has been 5 years since had bone density and pt request appt scheduled for bone density prior to CPx scheduled 09/09/13.Please advise.

## 2013-07-24 NOTE — Telephone Encounter (Signed)
Please place order for Bone Density instead of mammogram.

## 2013-07-25 ENCOUNTER — Telehealth: Payer: Self-pay | Admitting: Family Medicine

## 2013-07-25 MED ORDER — ZOSTER VACCINE LIVE 19400 UNT/0.65ML ~~LOC~~ SOLR: 0.6500 mL | Freq: Once | SUBCUTANEOUS | Status: DC

## 2013-07-25 NOTE — Telephone Encounter (Signed)
Pt would like RX for singles vaccine.  She is not sure which pharmacy she is going to use and would like to pick this up.  Please call her when ready.

## 2013-07-25 NOTE — Telephone Encounter (Signed)
I believe you said you can print this out for me to sign, right?

## 2013-07-25 NOTE — Telephone Encounter (Signed)
Left message for patient that Rx for Zostavax Injection is ready to be picked up at front desk.

## 2013-07-28 ENCOUNTER — Encounter: Payer: Medicare HMO | Admitting: Family Medicine

## 2013-08-05 ENCOUNTER — Encounter: Payer: Self-pay | Admitting: Family Medicine

## 2013-08-05 ENCOUNTER — Ambulatory Visit: Payer: Self-pay | Admitting: Family Medicine

## 2013-08-05 DIAGNOSIS — M8580 Other specified disorders of bone density and structure, unspecified site: Secondary | ICD-10-CM | POA: Insufficient documentation

## 2013-08-07 ENCOUNTER — Encounter: Payer: Self-pay | Admitting: Family Medicine

## 2013-08-07 ENCOUNTER — Ambulatory Visit (INDEPENDENT_AMBULATORY_CARE_PROVIDER_SITE_OTHER): Payer: Medicare HMO | Admitting: Family Medicine

## 2013-08-07 VITALS — BP 120/74 | HR 78 | Temp 98.7°F | Ht 63.0 in | Wt 157.5 lb

## 2013-08-07 DIAGNOSIS — T7840XA Allergy, unspecified, initial encounter: Secondary | ICD-10-CM

## 2013-08-07 DIAGNOSIS — I6529 Occlusion and stenosis of unspecified carotid artery: Secondary | ICD-10-CM

## 2013-08-07 DIAGNOSIS — R5381 Other malaise: Secondary | ICD-10-CM

## 2013-08-07 DIAGNOSIS — R5383 Other fatigue: Secondary | ICD-10-CM

## 2013-08-07 DIAGNOSIS — R6889 Other general symptoms and signs: Secondary | ICD-10-CM | POA: Insufficient documentation

## 2013-08-07 NOTE — Assessment & Plan Note (Signed)
UNclear cause , no rash seen.  No known exposures. Eval TSH, B12, glucose A1C ( given fami hx DM), CMET, cbc

## 2013-08-07 NOTE — Progress Notes (Signed)
Pre-visit discussion using our clinic review tool. No additional management support is needed unless otherwise documented below in the visit note.  

## 2013-08-07 NOTE — Patient Instructions (Addendum)
Cetaphil cream apply on arms and legs 1-2 daily.  Ca 600 mg and VIT  D 400 units twice daily Return for lab eval in AM fasting.

## 2013-08-07 NOTE — Assessment & Plan Note (Signed)
Eval with labs. 

## 2013-08-07 NOTE — Progress Notes (Signed)
Subjective:    Patient ID: Jean Davidson, female    DOB: Oct 21, 1945, 68 y.o.   MRN: [id number]  HPI  68 year old female with hx of HTN, GERD, cervical disease presents with  very sensitive hair follicles on arms and legs ongoing in last few months, but she had an episode of similar last spring. Skin is more sensitive when she is cold , temperature change and friction on arms and legs. Occ wakes her up at night... If she turns over and rubs skin the wrong way. No rash. Toes somewhat colder as well,   Poor sleep, no energy.  No swelling in ankles. No alopecia.  Using ceravae moisturizer, eucerin cream. Has to pat on because of extreme sensitivity. She has to use ice packs at time to help.   She had total shoulder replacement in 03/2013, s/ p rehab.  HCT was low in rehab.  trying to get back to cardio exercise.  No new exposures.  Review of Systems  Constitutional: Positive for fatigue. Negative for fever.  HENT: Negative for ear pain.   Eyes: Negative for pain.  Respiratory: Negative for cough, shortness of breath and wheezing.   Cardiovascular: Negative for chest pain.  Gastrointestinal: Negative for abdominal pain.       Objective:   Physical Exam  Constitutional: She is oriented to person, place, and time. Vital signs are normal. She appears well-developed and well-nourished. She is cooperative.  Non-toxic appearance. She does not appear ill. No distress.  HENT:  Head: Normocephalic.  Right Ear: Hearing, tympanic membrane, external ear and ear canal normal. Tympanic membrane is not erythematous, not retracted and not bulging.  Left Ear: Hearing, tympanic membrane, external ear and ear canal normal. Tympanic membrane is not erythematous, not retracted and not bulging.  Nose: No mucosal edema or rhinorrhea. Right sinus exhibits no maxillary sinus tenderness and no frontal sinus tenderness. Left sinus exhibits no maxillary sinus tenderness and no frontal sinus tenderness.    Mouth/Throat: Uvula is midline, oropharynx is clear and moist and mucous membranes are normal.  Eyes: Conjunctivae, EOM and lids are normal. Pupils are equal, round, and reactive to light. Lids are everted and swept, no foreign bodies found.  Neck: Trachea normal and normal range of motion. Neck supple. Carotid bruit is not present. No mass and no thyromegaly present.  Cardiovascular: Normal rate, regular rhythm, S1 normal, S2 normal, normal heart sounds, intact distal pulses and normal pulses.  Exam reveals no gallop and no friction rub.   No murmur heard. Pulses 2 plus post tib  Pulmonary/Chest: Effort normal and breath sounds normal. Not tachypneic. No respiratory distress. She has no decreased breath sounds. She has no wheezes. She has no rhonchi. She has no rales.  Abdominal: Soft. Normal appearance and bowel sounds are normal. There is no tenderness.  Neurological: She is alert and oriented to person, place, and time. She has normal strength and normal reflexes. No cranial nerve deficit or sensory deficit. She exhibits normal muscle tone. She displays a negative Romberg sign. Coordination and gait normal. GCS eye subscore is 4. GCS verbal subscore is 5. GCS motor subscore is 6.  Nml cerebellar exam   No papilledema  Skin: Skin is warm, dry and intact. No rash noted.  Sensitive to touch on arms and legs  Psychiatric: She has a normal mood and affect. Her speech is normal and behavior is normal. Judgment and thought content normal. Her mood appears not anxious. Cognition and memory are normal. Cognition  and memory are not impaired. She does not exhibit a depressed mood. She exhibits normal recent memory and normal remote memory.          Assessment & Plan:

## 2013-08-08 ENCOUNTER — Other Ambulatory Visit (INDEPENDENT_AMBULATORY_CARE_PROVIDER_SITE_OTHER): Payer: Medicare HMO

## 2013-08-08 DIAGNOSIS — M949 Disorder of cartilage, unspecified: Secondary | ICD-10-CM

## 2013-08-08 DIAGNOSIS — M8580 Other specified disorders of bone density and structure, unspecified site: Secondary | ICD-10-CM

## 2013-08-08 DIAGNOSIS — I10 Essential (primary) hypertension: Secondary | ICD-10-CM

## 2013-08-08 DIAGNOSIS — Z131 Encounter for screening for diabetes mellitus: Secondary | ICD-10-CM

## 2013-08-08 DIAGNOSIS — I6529 Occlusion and stenosis of unspecified carotid artery: Secondary | ICD-10-CM

## 2013-08-08 DIAGNOSIS — R5383 Other fatigue: Secondary | ICD-10-CM

## 2013-08-08 DIAGNOSIS — T7840XA Allergy, unspecified, initial encounter: Secondary | ICD-10-CM

## 2013-08-08 DIAGNOSIS — R5381 Other malaise: Secondary | ICD-10-CM

## 2013-08-08 DIAGNOSIS — R6889 Other general symptoms and signs: Secondary | ICD-10-CM

## 2013-08-08 DIAGNOSIS — M899 Disorder of bone, unspecified: Secondary | ICD-10-CM

## 2013-08-08 LAB — COMPREHENSIVE METABOLIC PANEL
ALT: 19 U/L (ref 0–35)
AST: 19 U/L (ref 0–37)
Albumin: 3.7 g/dL (ref 3.5–5.2)
Alkaline Phosphatase: 41 U/L (ref 39–117)
BUN: 13 mg/dL (ref 6–23)
CO2: 26 mEq/L (ref 19–32)
Calcium: 9.1 mg/dL (ref 8.4–10.5)
Chloride: 101 mEq/L (ref 96–112)
Creatinine, Ser: 0.7 mg/dL (ref 0.4–1.2)
GFR: 83.13 mL/min (ref >60.00)
Glucose, Bld: 97 mg/dL (ref 70–99)
Potassium: 4.2 mEq/L (ref 3.5–5.1)
Sodium: 137 mEq/L (ref 135–145)
Total Bilirubin: 0.6 mg/dL (ref 0.3–1.2)
Total Protein: 9.5 g/dL — ABNORMAL HIGH (ref 6.0–8.3)

## 2013-08-08 LAB — CBC WITH DIFFERENTIAL/PLATELET
HCT: 32 % — ABNORMAL LOW (ref 36.0–46.0)
Hemoglobin: 10.6 g/dL — ABNORMAL LOW (ref 12.0–15.0)
MCHC: 33 g/dL (ref 30.0–36.0)
MCV: 97.1 fl (ref 78.0–100.0)
Platelets: 122 10*3/uL — ABNORMAL LOW (ref 150.0–400.0)
RBC: 3.29 Mil/uL — ABNORMAL LOW (ref 3.87–5.11)
RDW: 17 % — ABNORMAL HIGH (ref 11.5–14.6)
WBC: 8 10*3/uL (ref 4.5–10.5)

## 2013-08-08 LAB — LIPID PANEL
Cholesterol: 110 mg/dL (ref 0–200)
HDL: 36.5 mg/dL — ABNORMAL LOW (ref >39.00)
LDL Cholesterol: 57 mg/dL (ref 0–99)
Total CHOL/HDL Ratio: 3
Triglycerides: 82 mg/dL (ref 0.0–149.0)
VLDL: 16.4 mg/dL (ref 0.0–40.0)

## 2013-08-08 LAB — HEMOGLOBIN A1C: Hgb A1c MFr Bld: 5.2 % (ref 4.6–6.5)

## 2013-08-08 LAB — VITAMIN B12: Vitamin B-12: 254 pg/mL (ref 211–911)

## 2013-08-08 LAB — TSH: TSH: 1.5 u[IU]/mL (ref 0.35–5.50)

## 2013-08-11 ENCOUNTER — Telehealth: Payer: Self-pay | Admitting: Family Medicine

## 2013-08-11 DIAGNOSIS — T7840XA Allergy, unspecified, initial encounter: Secondary | ICD-10-CM

## 2013-08-11 NOTE — Telephone Encounter (Signed)
Message copied by Jinny Sanders on Mon Aug 11, 2013 11:15 PM ------      Message from: Carter Kitten      Created: Mon Aug 11, 2013  9:03 AM       Spoke with Mrs  Mcgann as instructed. Pt made appt for 2/24 to discuss digestive issues... Would like to go ahead with dermatology referral, and wanted noted to verify chosen dermatologist takes her insurance.Gasper Sells, MA Student. ------

## 2013-08-19 ENCOUNTER — Ambulatory Visit: Payer: Medicare HMO | Admitting: Family Medicine

## 2013-08-22 ENCOUNTER — Encounter: Payer: Self-pay | Admitting: Family Medicine

## 2013-08-22 ENCOUNTER — Ambulatory Visit (INDEPENDENT_AMBULATORY_CARE_PROVIDER_SITE_OTHER): Payer: Medicare HMO | Admitting: Family Medicine

## 2013-08-22 VITALS — BP 122/60 | HR 94 | Temp 98.4°F | Ht 63.0 in | Wt 157.5 lb

## 2013-08-22 DIAGNOSIS — R140 Abdominal distension (gaseous): Secondary | ICD-10-CM | POA: Insufficient documentation

## 2013-08-22 DIAGNOSIS — R5383 Other fatigue: Secondary | ICD-10-CM

## 2013-08-22 DIAGNOSIS — R143 Flatulence: Secondary | ICD-10-CM

## 2013-08-22 DIAGNOSIS — T7840XA Allergy, unspecified, initial encounter: Secondary | ICD-10-CM

## 2013-08-22 DIAGNOSIS — R142 Eructation: Secondary | ICD-10-CM

## 2013-08-22 DIAGNOSIS — D649 Anemia, unspecified: Secondary | ICD-10-CM

## 2013-08-22 DIAGNOSIS — R141 Gas pain: Secondary | ICD-10-CM

## 2013-08-22 DIAGNOSIS — I6529 Occlusion and stenosis of unspecified carotid artery: Secondary | ICD-10-CM

## 2013-08-22 DIAGNOSIS — R5381 Other malaise: Secondary | ICD-10-CM

## 2013-08-22 NOTE — Assessment & Plan Note (Signed)
?   Secondary to bacterial imbalance in Gi tract. Will try lactobaccili, exercise, increase water and healthy low fat higher fiber foods.

## 2013-08-22 NOTE — Assessment & Plan Note (Signed)
Has appt with derm for eval.

## 2013-08-22 NOTE — Progress Notes (Signed)
Subjective:    Patient ID: Jean Davidson, female    DOB: Oct 14, 1945, 68 y.o.   MRN: [id number]  HPI  68 year old female with history of HTN, GERD presents with early satiety, bloating, increase lactose intolerance, increase gurgling of stomach.  Increasingly gassy and burping... Some relief.  No N/V. No abdominal pain. No D/C.Marland Kitchen But more frequent BMs than usually.  No blood in stool.  No dysuria.  She has noted since she has been home from shoulder  Replacement.. She has had these symptoms. Was premedicated with antibiotics. In past month also was premedicated prior to dental procedure.   Rare heartburn.  She has tried gas x.   Margit Hanks is using prilosec every few days.  Skin sensitivity is ongoing... Seen last 08/07/2013, Has appt soon with Derm.   Wt Readings from Last 3 Encounters:  08/22/13 157 lb 8 oz (71.442 kg)  08/07/13 157 lb 8 oz (71.442 kg)  01/24/13 165 lb (74.844 kg)    She as aunt with colon cancer.  Last colonoscopy nml is few years ago.. Repeats every 5 years.  In addition she has noted increase in sensitivity over B cervical lymph nodes. No enlargement.   Dentist stated nml.  She was noted to have low normal B12 on recent labs and was anemic but stable from 2013.  Review of Systems  Constitutional: Positive for fatigue and unexpected weight change. Negative for fever.  HENT: Negative for ear pain.   Eyes: Negative for pain.  Respiratory: Negative for cough and shortness of breath.   Cardiovascular: Negative for chest pain and leg swelling.  Gastrointestinal: Negative for diarrhea, constipation, blood in stool and abdominal distention.       Objective:   Physical Exam  Constitutional: Vital signs are normal. She appears well-developed and well-nourished. She is cooperative.  Non-toxic appearance. She does not appear ill. No distress.  HENT:  Head: Normocephalic.  Right Ear: Hearing, tympanic membrane, external ear and ear canal normal. Tympanic membrane  is not erythematous, not retracted and not bulging.  Left Ear: Hearing, tympanic membrane, external ear and ear canal normal. Tympanic membrane is not erythematous, not retracted and not bulging.  Nose: No mucosal edema or rhinorrhea. Right sinus exhibits no maxillary sinus tenderness and no frontal sinus tenderness. Left sinus exhibits no maxillary sinus tenderness and no frontal sinus tenderness.  Mouth/Throat: Uvula is midline, oropharynx is clear and moist and mucous membranes are normal.  Eyes: Conjunctivae, EOM and lids are normal. Pupils are equal, round, and reactive to light. Lids are everted and swept, no foreign bodies found.  Neck: Trachea normal and normal range of motion. Neck supple. Carotid bruit is not present. No mass and no thyromegaly present.  Cardiovascular: Normal rate, regular rhythm, S1 normal, S2 normal, normal heart sounds, intact distal pulses and normal pulses.  Exam reveals no gallop and no friction rub.   No murmur heard. Pulmonary/Chest: Effort normal and breath sounds normal. Not tachypneic. No respiratory distress. She has no decreased breath sounds. She has no wheezes. She has no rhonchi. She has no rales.  Abdominal: Soft. Normal appearance and bowel sounds are normal. There is no tenderness.  Neurological: She is alert.  Skin: Skin is warm, dry and intact. No rash noted.  Psychiatric: Her speech is normal and behavior is normal. Judgment and thought content normal. Her mood appears not anxious. Cognition and memory are normal. She does not exhibit a depressed mood.  Assessment & Plan:

## 2013-08-22 NOTE — Progress Notes (Signed)
Pre visit review using our clinic review tool, if applicable. No additional management support is needed unless otherwise documented below in the visit note. 

## 2013-08-22 NOTE — Assessment & Plan Note (Signed)
Labs showed nml but low range B12... Will have her supplement.

## 2013-08-22 NOTE — Patient Instructions (Signed)
Restart B12 1000 mcg daily. Start align ( lactobaccili ) daily. Restart exercise.  Increase water in diet and eat healthy low fat foods. Increase iron containing foods in diet. Calcium  600 mg twice daily, vit D 400 IU twice daily. Cancel labs on way out.

## 2013-08-22 NOTE — Assessment & Plan Note (Signed)
Cholesterol well controlled at goal < 70 on no med.

## 2013-08-22 NOTE — Assessment & Plan Note (Signed)
Chronic may be due to chronic diease as opposed to iron def , minimal change from 2013. Given GI issues currently she will not start iron but instead will increase iron in foods.

## 2013-08-28 ENCOUNTER — Other Ambulatory Visit: Payer: Medicare HMO

## 2013-09-01 ENCOUNTER — Ambulatory Visit (INDEPENDENT_AMBULATORY_CARE_PROVIDER_SITE_OTHER): Payer: Medicare HMO | Admitting: Family Medicine

## 2013-09-01 ENCOUNTER — Encounter: Payer: Self-pay | Admitting: Family Medicine

## 2013-09-01 VITALS — BP 120/60 | HR 82 | Temp 98.8°F | Ht 63.0 in | Wt 158.8 lb

## 2013-09-01 DIAGNOSIS — J029 Acute pharyngitis, unspecified: Secondary | ICD-10-CM

## 2013-09-01 DIAGNOSIS — J069 Acute upper respiratory infection, unspecified: Secondary | ICD-10-CM

## 2013-09-01 LAB — POCT RAPID STREP A (OFFICE): Rapid Strep A Screen: NEGATIVE

## 2013-09-01 NOTE — Progress Notes (Signed)
Patient Name: Jean Davidson Date of Birth: 12/06/1945 Medical Record Number: [id number]  History of Present Illness:  This 68 y.o. female patient presents with runny nose, sneezing, cough, sore throat, malaise and minimal / low-grade fever .  Afraid had strep throat, yesterday, glands very swollen and chest seems ok. A little woozy. Coughing some.  ? recent exposure to others with similar symptoms.   The patent denies sore throat as the primary complaint. Denies sthortness of breath/wheezing, high fever, chest pain, rhinits for more than 14 days, significant myalgia, otalgia, facial pain, abdominal pain, changes in bowel or bladder.  PMH, PHS, Allergies, Problem List, Medications, Family History, and Social History have all been reviewed.  Patient Active Problem List   Diagnosis Date Noted  . Normocytic anemia 08/22/2013  . Abdominal bloating 08/22/2013  . Hypersensitivity 08/07/2013  . Cold intolerance 08/07/2013  . Other malaise and fatigue 08/07/2013  . Osteopenia 08/05/2013  . GERD (gastroesophageal reflux disease) 01/16/2013  . Insomnia 01/16/2013  . Carotid stenosis   . Cervical disc disease   . Hypertension 04/15/2012  . Palpitations 03/29/2012    Past Medical History  Diagnosis Date  . Cervical disc disease     c7  . Spinal stenosis in cervical region   . Carpal tunnel syndrome   . Pinched nerve   . Varicose veins of lower extremities with inflammation   . Disc degeneration   . Hypertension     Borderline.  . Carotid stenosis     Mild bilateral    Past Surgical History  Procedure Laterality Date  . Colonoscopy    . Bladder tact      x2    History   Social History  . Marital Status: Divorced    Spouse Name: N/A    Number of Children: N/A  . Years of Education: N/A   Occupational History  . Not on file.   Social History Main Topics  . Smoking status: Former Smoker -- 0.50 packs/day for 4 years  . Smokeless tobacco: Never Used  . Alcohol Use:  4.8 oz/week    8 Cans of beer per week  . Drug Use: No  . Sexual Activity: Not on file   Other Topics Concern  . Not on file   Social History Narrative  . No narrative on file    Family History  Problem Relation Age of Onset  . Heart disease Mother   . Heart failure Father   . Heart disease Father   . Hypertension Brother   . Heart disease Brother     congenital shunt, ?     Allergies  Allergen Reactions  . Penicillins   . Sulfa Antibiotics     Medication list reviewed and updated in full in [facility].  Review of Systems: as above, eating and drinking - tolerating PO. Urinating normally. No excessive vomitting or diarrhea. O/w as above.  Physical Exam:  Filed Vitals:   09/01/13 1144  BP: 120/60  Pulse: 82  Temp: 98.8 F (37.1 C)  TempSrc: Oral  Height: 5\' 3"  (1.6 m)  Weight: 158 lb 12 oz (72.009 kg)    GEN: WDWN, Non-toxic, Atraumatic, normocephalic. A and O x 3. HEENT: Oropharynx clear without exudate, MMM, no significant LAD, mild rhinnorhea Ears: TM clear, COL visualized with good landmarks CV: RRR, no m/g/r. Pulm: CTA B, no wheezes, rhonchi, or crackles, normal respiratory effort. EXT: no c/c/e Psych: well oriented, neither depressed nor anxious in appearance  Objective Data: Results  for orders placed in visit on 09/01/13  POCT RAPID STREP A (OFFICE)      Result Value Ref Range   Rapid Strep A Screen Negative  Negative    A/P: 1. URI. Supportive care reviewed with patient. See patient instruction section.  New Prescriptions   No medications on file    Patient Instructions: Upper Respiratory Infection -Viral Infections  TREATMENT THAT HELPS WITH SYMPTOMS: 1. Drink plenty of fluids, but limit caffeine  2. Decongestant: for congested noses, sinuses, and ear tubes. Pressure release and help drainage: Sudafed (pseudephedrine or Phenylephrine) (NOT IF YOU HAVE HIGH BLOOD PRESSURE)  3. Nasal Sprays: Relieve pressure, promote drainage,  open nasal and ear passages. Afrin can be used for only 3-4 days in row.  4. Cough Suppressants: Delsym is an example of a cough suppressant. It lasts for 12 hours  6. Expectorants: Liquify secretions and improve drainage  Take Guaifenesin (400mg ), take 11/2 tabs by mouth AM and NOON. This is a higher dose than what the box says, but that is ok. It is safe and it liquifies the mucous better at this dose.  Get GUAIFENESIN by  going to [facility], [place] or [facility] and getting MUCOUS RELIEF EXPECTORANT/CONGESTION. DO NOT GET MUCINEX (Timed Release Guaifenesin)   THESE WILL MAKE YOU FEEL BETTER FOR A WHILE, SO YOU CAN DEAL WITH YOUR SYMPTOMS. YOUR BODY HAS TO HEAL ITSELF.  ANTIBIOTICS DO NOT HELP IF YOU HAVE A VIRUS OR BAD COLD.  Antibiotics kill bacteria not viruses.  Bad viruses can make you feel just as bad or worse than bacterial infections (Like the flu - it is a virus)   Signed,  Azriella Mattia T. Zaeda Mcferran, MD, [place] at [facility] [place] [address] Phone: [phone] Fax: [phone]   Patient's Medications  New Prescriptions   No medications on file  Previous Medications   ASPIRIN 81 MG TABLET    Take 81 mg by mouth daily.   CARVEDILOL (COREG) 6.25 MG TABLET    Take 3.125 mg by mouth 2 (two) times daily with a meal.   CLINDAMYCIN (CLEOCIN) 300 MG CAPSULE    Take 300 mg by mouth as needed. Pre dental visits   DICLOFENAC (VOLTAREN) 75 MG EC TABLET    TAKE 1 TABLET BY MOUTH TWICE A DAY   FIBER PO    Take by mouth as needed.   MULTIPLE VITAMINS-MINERALS (CENTRUM SILVER ULTRA WOMENS PO)    Take by mouth daily.   OMEPRAZOLE (PRILOSEC PO)    Take by mouth as needed.   OXYCODONE-ACETAMINOPHEN (PERCOCET/ROXICET) 5-325 MG PER TABLET    Take 1 tablet by mouth as needed for severe pain.   PROBIOTIC PRODUCT (ALIGN) 4 MG CAPS    Take 1 capsule by mouth daily.   ZOSTER VACCINE LIVE, PF, (ZOSTAVAX) 55732 UNT/0.65ML INJECTION     Inject 19,400 Units into the skin once.  Modified Medications   No medications on file  Discontinued Medications   No medications on file

## 2013-09-01 NOTE — Progress Notes (Signed)
Pre visit review using our clinic review tool, if applicable. No additional management support is needed unless otherwise documented below in the visit note. 

## 2013-09-05 ENCOUNTER — Telehealth: Payer: Self-pay

## 2013-09-05 MED ORDER — AZITHROMYCIN 250 MG PO TABS: ORAL_TABLET | ORAL | Status: DC

## 2013-09-05 NOTE — Telephone Encounter (Signed)
Patient notified prescription for antibiotic will be sent.  Patient request the Rx get sent to [facility]. [place] in [place], [place].  Prescription sent in electronically.

## 2013-09-05 NOTE — Telephone Encounter (Signed)
Pt left v/m; pt was seen 09/01/13 and no med was prescribed; OTC meds not helping; pt still has deep cough and nasal congestion. Pt is presently in [place] and pt request cb.  Spoke with pt; no fever, SOB or wheezing;non prod deep cough is still present. Pt will have pharmacy # to call med to [place] upon cb.

## 2013-09-05 NOTE — Telephone Encounter (Signed)
Send in Rx to [facility]

## 2013-09-09 ENCOUNTER — Ambulatory Visit (INDEPENDENT_AMBULATORY_CARE_PROVIDER_SITE_OTHER): Payer: Medicare HMO | Admitting: Family Medicine

## 2013-09-09 ENCOUNTER — Encounter: Payer: Self-pay | Admitting: Family Medicine

## 2013-09-09 ENCOUNTER — Encounter: Payer: Medicare HMO | Admitting: Family Medicine

## 2013-09-09 VITALS — BP 124/68 | HR 83 | Temp 98.4°F | Ht 62.75 in | Wt 155.0 lb

## 2013-09-09 DIAGNOSIS — I10 Essential (primary) hypertension: Secondary | ICD-10-CM

## 2013-09-09 DIAGNOSIS — J069 Acute upper respiratory infection, unspecified: Secondary | ICD-10-CM

## 2013-09-09 DIAGNOSIS — R140 Abdominal distension (gaseous): Secondary | ICD-10-CM

## 2013-09-09 DIAGNOSIS — Z0000 Encounter for general adult medical examination without abnormal findings: Secondary | ICD-10-CM

## 2013-09-09 MED ORDER — HYDROCODONE-HOMATROPINE 5-1.5 MG/5ML PO SYRP: 5.0000 mL | ORAL_SOLUTION | Freq: Every evening | ORAL | Status: DC | PRN

## 2013-09-09 NOTE — Patient Instructions (Addendum)
Call to get mammogram results. Treat with cough suppressant, fluids, rest and time.  Call if symptoms worsening as opposed to getting better day to day. Call if other symptoms not improving as expected for posisble GI referral or Antigliadin antibodies.

## 2013-09-09 NOTE — Assessment & Plan Note (Signed)
Improving symptoms s/p zpack, but still with cough.. Treat with cough suppressant, fluids, rest and time. Call if symptoms worsening as opposed to getting better day to day.

## 2013-09-09 NOTE — Progress Notes (Signed)
Pre visit review using our clinic review tool, if applicable. No additional management support is needed unless otherwise documented below in the visit note. 

## 2013-09-09 NOTE — Progress Notes (Signed)
Subjective:    Patient ID: Jean Davidson, female    DOB: 1946/06/13, 68 y.o.   MRN: [id number]  HPI I have personally reviewed the Medicare Annual Wellness questionnaire and have noted 1. The patient's medical and social history 2. Their use of alcohol, tobacco or illicit drugs 3. Their current medications and supplements 4. The patient's functional ability including ADL's, fall risks, home safety risks and hearing or visual             impairment. 5. Diet and physical activities 6. Evidence for depression or mood disorders The patients weight, height, BMI and visual acuity have been recorded in the chart I have made referrals, counseling and provided education to the patient based review of the above and I have provided the pt with a written personalized care plan for preventive services.  Sees GYN for annual in fall.  Hypertension:  Well controlleld on coreg BP Readings from Last 3 Encounters:  09/09/13 124/68  09/01/13 120/60  08/22/13 122/60  Using medication without problems or lightheadedness: None Chest pain with exertion:None Edema:None Short of breath:None Average home BPs: not checking Other issues:  Low B12 on supplement  Abdominal bloating x : On lactobaccili, increase water and high fiber.Marland Kitchen Helped some, but still some issues. Now more diarrhea, gurgling.  She is lactose intolerance and already acoid calcium.  Hypersensitivity of skin: Referred to Derm at last OV... Has not seen yet.  Improved with decrease heat in house and less cold weather.  She has compelted z-pack... URI improved some.. But still deep cough. Runny nose. No fever.  Mild SOB.  No wheeze. Tired and weak.   Review of Systems  Constitutional: Negative for fever and fatigue.  HENT: Negative for ear pain.   Eyes: Negative for pain.  Respiratory: Positive for cough and shortness of breath. Negative for chest tightness.   Cardiovascular: Positive for leg swelling. Negative for chest pain and  palpitations.  Gastrointestinal: Negative for abdominal pain.  Genitourinary: Negative for dysuria.       Objective:   Physical Exam  Constitutional: Vital signs are normal. She appears well-developed and well-nourished. She is cooperative.  Non-toxic appearance. She does not appear ill. No distress.  HENT:  Head: Normocephalic.  Right Ear: Hearing, tympanic membrane, external ear and ear canal normal.  Left Ear: Hearing, tympanic membrane, external ear and ear canal normal.  Nose: Nose normal.  Eyes: Conjunctivae, EOM and lids are normal. Pupils are equal, round, and reactive to light. Lids are everted and swept, no foreign bodies found.  Neck: Trachea normal and normal range of motion. Neck supple. Carotid bruit is not present. No mass and no thyromegaly present.  Cardiovascular: Normal rate, regular rhythm, S1 normal, S2 normal, normal heart sounds and intact distal pulses.  Exam reveals no gallop.   No murmur heard. Pulmonary/Chest: Effort normal and breath sounds normal. No respiratory distress. She has no wheezes. She has no rhonchi. She has no rales.  Abdominal: Soft. Normal appearance and bowel sounds are normal. She exhibits no distension, no fluid wave, no abdominal bruit and no mass. There is no hepatosplenomegaly. There is no tenderness. There is no rebound, no guarding and no CVA tenderness. No hernia.  Lymphadenopathy:    She has no cervical adenopathy.    She has no axillary adenopathy.  Neurological: She is alert. She has normal strength. No cranial nerve deficit or sensory deficit.  Skin: Skin is warm, dry and intact. No rash noted.  Psychiatric: Her speech is  normal and behavior is normal. Judgment normal. Her mood appears not anxious. Cognition and memory are normal. She does not exhibit a depressed mood.          Assessment & Plan:  The patient's preventative maintenance and recommended screening tests for an annual wellness exam were reviewed in full  today. Brought up to date unless services declined.  Counselled on the importance of diet, exercise, and its role in overall health and mortality. The patient's FH and SH was reviewed, including their home life, tobacco status, and drug and alcohol status.   Vaccines:uptodate with Tdap and PNA, plans on shingles, due for prevnar   Mammo: done per pt.. Will get records. DVE/PAP:Nml 2013, had DVE in fall 2014. Colon: 2011, due q 5 years. DEXA: 07/2013 osteopenia

## 2013-09-10 ENCOUNTER — Telehealth: Payer: Self-pay | Admitting: Family Medicine

## 2013-09-10 NOTE — Telephone Encounter (Signed)
Relevant patient education assigned to patient using Emmi. ° °

## 2013-09-15 ENCOUNTER — Telehealth: Payer: Self-pay

## 2013-09-15 MED ORDER — BENZONATATE 200 MG PO CAPS: 200.0000 mg | ORAL_CAPSULE | Freq: Three times a day (TID) | ORAL | Status: DC | PRN

## 2013-09-15 NOTE — Telephone Encounter (Signed)
Pt left v/m; pt has been seen x 2 and still has deep non prod cough, fever on 09/14/13 but no fever today.still has head and chest congestion, slight wheeze. No SOB unless long episode of coughing. Pt is not getting any sleep; Pt can only take Hycodan once daily.pt request med.pt request cb.Please advise. Pt request cb. [place].

## 2013-09-15 NOTE — Telephone Encounter (Signed)
You can take more than that if not driving. (hycodan)  Tessalon Perles 200 mg, 1 po tid prn cough, #60, 0 refills

## 2013-09-15 NOTE — Telephone Encounter (Signed)
Spoke with Beverlee Nims.  She is continuing to have cough and head congestion.  Per Dr. Lorelei Pont, I advised patient she can take the Hycodan more than at night as long as she is not driving or trying to work because it can cause drowsiness.  Also advised we could try [employer] which she can take up to three times a day for cough.  Advised to continue taking Mucinex DM for the congestion and if not improving toward the end of the week to call me back.  Prescription for Ladona Ridgel sent to [facility].

## 2013-09-25 ENCOUNTER — Telehealth: Payer: Self-pay

## 2013-09-25 MED ORDER — DOXYCYCLINE HYCLATE 100 MG PO TABS: 100.0000 mg | ORAL_TABLET | Freq: Two times a day (BID) | ORAL | Status: DC

## 2013-09-25 NOTE — Telephone Encounter (Signed)
Will treat with doxycycline for atypical bacterial infection.

## 2013-09-25 NOTE — Telephone Encounter (Signed)
Pt left v/m; pt has been seen x 2 since 09/01/13 for upper respiratory symptoms; now pt has head congestion, ears crackle when blows nose, drainage at back of throat, and ribs hurt on and off; temp averaging 99.5, not sleeping well and not eating well; pt still has cough and pt request med called to [facility].Please advise.

## 2013-09-25 NOTE — Telephone Encounter (Signed)
Jean Davidson notified prescription for doxycycline has been sent to her pharmacy.  Asking what she can take for allergies being on Coreg.  I recommended Zyrtec or Allegra but advised her to double check with the pharmacist to make sure those will not interact with her Coreg.

## 2013-10-02 ENCOUNTER — Ambulatory Visit (INDEPENDENT_AMBULATORY_CARE_PROVIDER_SITE_OTHER)
Admission: RE | Admit: 2013-10-02 | Discharge: 2013-10-02 | Disposition: A | Discharge status: Home / Self Care | Payer: Medicare HMO | Source: Ambulatory Visit | Attending: Family Medicine | Admitting: Family Medicine

## 2013-10-02 ENCOUNTER — Ambulatory Visit (INDEPENDENT_AMBULATORY_CARE_PROVIDER_SITE_OTHER): Payer: Medicare HMO | Admitting: Family Medicine

## 2013-10-02 ENCOUNTER — Encounter: Payer: Self-pay | Admitting: Family Medicine

## 2013-10-02 VITALS — BP 120/66 | HR 78 | Temp 98.8°F | Ht 62.75 in | Wt 151.8 lb

## 2013-10-02 DIAGNOSIS — R059 Cough, unspecified: Secondary | ICD-10-CM

## 2013-10-02 DIAGNOSIS — R141 Gas pain: Secondary | ICD-10-CM

## 2013-10-02 DIAGNOSIS — R142 Eructation: Secondary | ICD-10-CM

## 2013-10-02 DIAGNOSIS — R5383 Other fatigue: Secondary | ICD-10-CM

## 2013-10-02 DIAGNOSIS — R053 Chronic cough: Secondary | ICD-10-CM

## 2013-10-02 DIAGNOSIS — R05 Cough: Secondary | ICD-10-CM

## 2013-10-02 DIAGNOSIS — R634 Abnormal weight loss: Secondary | ICD-10-CM

## 2013-10-02 DIAGNOSIS — D649 Anemia, unspecified: Secondary | ICD-10-CM

## 2013-10-02 DIAGNOSIS — R140 Abdominal distension (gaseous): Secondary | ICD-10-CM

## 2013-10-02 DIAGNOSIS — R143 Flatulence: Secondary | ICD-10-CM

## 2013-10-02 DIAGNOSIS — R5381 Other malaise: Secondary | ICD-10-CM

## 2013-10-02 NOTE — Assessment & Plan Note (Signed)
Bloating improved but still with excessive bothersome gurgling... Pt request referral to GI for further eval.

## 2013-10-02 NOTE — Assessment & Plan Note (Signed)
Likely post inflammatory bronchospasm, some gradaul improvement but with abn weight los.. Will eval with CXR. If clear, D/C doxy and consider albuterol inh prn.

## 2013-10-02 NOTE — Progress Notes (Signed)
Pre visit review using our clinic review tool, if applicable. No additional management support is needed unless otherwise documented below in the visit note. 

## 2013-10-02 NOTE — Progress Notes (Signed)
Subjective:    Patient ID: Jean Davidson, female    DOB: [birth year], 68 y.o.   MRN: [id number]  Cough This is a new problem. The current episode started more than 1 month ago. The problem has been unchanged. The cough is non-productive. Associated symptoms include nasal congestion and postnasal drip. Pertinent negatives include no ear pain, fever, myalgias, rash, sore throat, shortness of breath or wheezing. Risk factors: nonsmoker. There is no history of asthma, COPD, emphysema, environmental allergies or pneumonia.    On  Zpack in 08/2013, with some benefit.  Now on day 7/10 for doxycycline., can only take once a day due to GI SE. Some benefit with this as well. Continued fatigued and further unintended weight loss (she has not tried, she has no felt like eating much).   She has tried to make changes to help with GI.  Has lost 10 lbs in last few months.   Wt Readings from Last 3 Encounters:  10/02/13 151 lb 12 oz (68.833 kg)  09/09/13 155 lb (70.308 kg)  09/01/13 158 lb 12 oz (72.009 kg)   Saw Derm about hypersensitivity of skin. Given lotion and shaving cream to help with sensitivity.   She would like to see GI about decreased appetite, early satiety. Very rumbling gurgling noises in belly.  Abdominal bloating is better.  Occ GERD after pizza.  She would like to see GI MD.  Normocytic anemia... Hg 11 in 2013, recent labs showed  Hg 10.6. Nml 2011 colonoscopy. Dr. Octavia Heir. Not interested in returning to him.   Review of Systems  Constitutional: Negative for fever.  HENT: Positive for postnasal drip. Negative for ear pain and sore throat.   Respiratory: Positive for cough. Negative for shortness of breath and wheezing.   Musculoskeletal: Negative for myalgias.  Skin: Negative for rash.  Allergic/Immunologic: Negative for environmental allergies.       Objective:   Physical Exam  Constitutional: Vital signs are normal. She appears well-developed and well-nourished. She is  cooperative.  Non-toxic appearance. She does not appear ill. No distress.  HENT:  Head: Normocephalic.  Right Ear: Hearing, tympanic membrane, external ear and ear canal normal. Tympanic membrane is not erythematous, not retracted and not bulging.  Left Ear: Hearing, tympanic membrane, external ear and ear canal normal. Tympanic membrane is not erythematous, not retracted and not bulging.  Nose: No mucosal edema or rhinorrhea. Right sinus exhibits no maxillary sinus tenderness and no frontal sinus tenderness. Left sinus exhibits no maxillary sinus tenderness and no frontal sinus tenderness.  Mouth/Throat: Uvula is midline, oropharynx is clear and moist and mucous membranes are normal.  Eyes: Conjunctivae, EOM and lids are normal. Pupils are equal, round, and reactive to light. Lids are everted and swept, no foreign bodies found.  Neck: Trachea normal and normal range of motion. Neck supple. Carotid bruit is not present. No mass and no thyromegaly present.  Cardiovascular: Normal rate, regular rhythm, S1 normal, S2 normal, normal heart sounds, intact distal pulses and normal pulses.  Exam reveals no gallop and no friction rub.   No murmur heard. Pulmonary/Chest: Effort normal and breath sounds normal. Not tachypneic. No respiratory distress. She has no decreased breath sounds. She has no wheezes. She has no rhonchi. She has no rales.  Abdominal: Soft. Normal appearance and bowel sounds are normal. There is no tenderness.  Neurological: She is alert.  Skin: Skin is warm, dry and intact. No rash noted.  Psychiatric: Her speech is normal and behavior is normal.  Judgment and thought content normal. Her mood appears not anxious. Cognition and memory are normal. She does not exhibit a depressed mood.          Assessment & Plan:

## 2013-10-02 NOTE — Assessment & Plan Note (Signed)
Likely due to diet changes for GI issues, but very unexpected for pt.

## 2013-10-02 NOTE — Assessment & Plan Note (Signed)
She is anemic, has been in past... Will eval for blood loss with Stool tests.

## 2013-10-02 NOTE — Assessment & Plan Note (Signed)
LAb eval negative other than anemia

## 2013-10-02 NOTE — Patient Instructions (Addendum)
Return stool cards to evaluate for blood.  We will call with CXR results. Stop at front desk for GI referral. Continue doxycycline for now, we will likely stop if CXR clear. Follow up in 2-4 weeks for multiple medical issues.

## 2013-10-03 ENCOUNTER — Telehealth: Payer: Self-pay | Admitting: Family Medicine

## 2013-10-03 ENCOUNTER — Encounter: Payer: Self-pay | Admitting: Internal Medicine

## 2013-10-03 ENCOUNTER — Ambulatory Visit: Payer: Medicare HMO | Admitting: Family Medicine

## 2013-10-03 MED ORDER — ALBUTEROL SULFATE HFA 108 (90 BASE) MCG/ACT IN AERS: 2.0000 | INHALATION_SPRAY | RESPIRATORY_TRACT | Status: DC | PRN

## 2013-10-03 NOTE — Telephone Encounter (Signed)
Message copied by Jinny Sanders on Fri Oct 03, 2013  8:24 AM ------      Message from: Carter Kitten      Created: Thu Oct 02, 2013  5:28 PM       Patient notified as instructed by telephone.  Would like to try the albuterol inhaler.  Please send prescription in to [facility]. ------

## 2013-10-06 ENCOUNTER — Encounter: Payer: Self-pay | Admitting: Family Medicine

## 2013-10-06 ENCOUNTER — Other Ambulatory Visit: Payer: Self-pay | Admitting: Family Medicine

## 2013-10-06 DIAGNOSIS — D649 Anemia, unspecified: Secondary | ICD-10-CM

## 2013-10-06 LAB — HEMOCCULT SLIDES (X 3 CARDS)
OCCULT 1: NEGATIVE
OCCULT 2: NEGATIVE
OCCULT 3: NEGATIVE

## 2013-10-06 NOTE — Addendum Note (Signed)
Addended by: Carter Kitten on: 10/06/2013 03:00 PM   Modules accepted: Orders

## 2013-10-23 ENCOUNTER — Encounter: Payer: Self-pay | Admitting: Family Medicine

## 2013-10-24 ENCOUNTER — Ambulatory Visit (INDEPENDENT_AMBULATORY_CARE_PROVIDER_SITE_OTHER): Payer: Medicare HMO | Admitting: Family Medicine

## 2013-10-24 ENCOUNTER — Encounter: Payer: Self-pay | Admitting: Family Medicine

## 2013-10-24 VITALS — BP 134/70 | HR 86 | Temp 98.7°F | Ht 62.75 in | Wt 149.5 lb

## 2013-10-24 DIAGNOSIS — D649 Anemia, unspecified: Secondary | ICD-10-CM

## 2013-10-24 DIAGNOSIS — R141 Gas pain: Secondary | ICD-10-CM

## 2013-10-24 DIAGNOSIS — R142 Eructation: Secondary | ICD-10-CM

## 2013-10-24 DIAGNOSIS — R6881 Early satiety: Secondary | ICD-10-CM

## 2013-10-24 DIAGNOSIS — R5383 Other fatigue: Secondary | ICD-10-CM

## 2013-10-24 DIAGNOSIS — R143 Flatulence: Secondary | ICD-10-CM

## 2013-10-24 DIAGNOSIS — R140 Abdominal distension (gaseous): Secondary | ICD-10-CM

## 2013-10-24 DIAGNOSIS — R634 Abnormal weight loss: Secondary | ICD-10-CM

## 2013-10-24 DIAGNOSIS — D472 Monoclonal gammopathy: Secondary | ICD-10-CM

## 2013-10-24 DIAGNOSIS — K219 Gastro-esophageal reflux disease without esophagitis: Secondary | ICD-10-CM

## 2013-10-24 DIAGNOSIS — R5381 Other malaise: Secondary | ICD-10-CM

## 2013-10-24 NOTE — Telephone Encounter (Signed)
Office note and labs requested from Dr. Maurie Boettcher office.  Patient is scheduled to see Dr. Diona Browner today to discuss results.

## 2013-10-24 NOTE — Telephone Encounter (Signed)
Please get the note from the derm visit ASAP

## 2013-10-24 NOTE — Progress Notes (Signed)
   Subjective:    Patient ID: Jean Davidson, female    DOB: 1945-11-10, 68 y.o.   MRN: [id number]  HPI  68 year old female presents for discussion of lab results obtained at dermatologist for her recent symptoms of skin hypersensitivity.  She has also been noting  Continued unexpected weight loss and fatigue.   Wt Readings from Last 3 Encounters:  10/24/13 149 lb 8 oz (67.813 kg)  10/02/13 151 lb 12 oz (68.833 kg)  09/09/13 155 lb (70.308 kg)   Records from [facility] reviewed:  Sed rate: slightly elevated. RF nml. SPEP:  Showed:  Two monoclonal protein spikes: IgA spike and Beta 2 spike  She has nml renal function. Nml calcium.  She does have anemia and slightly low platelets.  She has history of anemia long term.  She continues to have occ loose stools, decreased appetite, not as much abdominal bloating. Early satiety. Has appt with Dr. Henrene Pastor on 11/24/2013. She would like to get in there faster.    Review of Systems  Constitutional: Positive for fatigue. Negative for fever.  HENT: Negative for ear pain.   Eyes: Negative for pain.  Respiratory: Negative for shortness of breath.   Cardiovascular: Negative for chest pain.  Gastrointestinal: Positive for abdominal distention. Negative for abdominal pain.       Objective:   Physical Exam  Constitutional: Vital signs are normal. She appears well-developed and well-nourished. She is cooperative.  Non-toxic appearance. She does not appear ill. No distress.  HENT:  Head: Normocephalic.  Right Ear: Hearing, tympanic membrane, external ear and ear canal normal. Tympanic membrane is not erythematous, not retracted and not bulging.  Left Ear: Hearing, tympanic membrane, external ear and ear canal normal. Tympanic membrane is not erythematous, not retracted and not bulging.  Nose: No mucosal edema or rhinorrhea. Right sinus exhibits no maxillary sinus tenderness and no frontal sinus tenderness. Left sinus exhibits no maxillary sinus tenderness  and no frontal sinus tenderness.  Mouth/Throat: Uvula is midline, oropharynx is clear and moist and mucous membranes are normal.  Eyes: Conjunctivae, EOM and lids are normal. Pupils are equal, round, and reactive to light. Lids are everted and swept, no foreign bodies found.  Neck: Trachea normal and normal range of motion. Neck supple. Carotid bruit is not present. No mass and no thyromegaly present.  Cardiovascular: Normal rate, regular rhythm, S1 normal, S2 normal, normal heart sounds, intact distal pulses and normal pulses.  Exam reveals no gallop and no friction rub.   No murmur heard. Pulmonary/Chest: Effort normal and breath sounds normal. Not tachypneic. No respiratory distress. She has no decreased breath sounds. She has no wheezes. She has no rhonchi. She has no rales.  Abdominal: Soft. Normal appearance and bowel sounds are normal. There is no tenderness.  Neurological: She is alert.  Skin: Skin is warm, dry and intact. No rash noted.  Psychiatric: Her speech is normal and behavior is normal. Judgment and thought content normal. Her mood appears not anxious. Cognition and memory are normal. She does not exhibit a depressed mood.          Assessment & Plan:  Total visit time 25 minutes, > 50% spent counseling and cordinating patients care.  Discussed pt labs and further eval likely needed. Questions answered as able.  She continues to have GI issues and weight loss. Will still need to keep appt with GI. We will try to move this appt closer.

## 2013-10-24 NOTE — Progress Notes (Signed)
Pre visit review using our clinic review tool, if applicable. No additional management support is needed unless otherwise documented below in the visit note. 

## 2013-10-24 NOTE — Patient Instructions (Signed)
Stop at front desk on way out to speak with [facility].

## 2013-10-27 ENCOUNTER — Telehealth: Payer: Self-pay | Admitting: *Deleted

## 2013-10-27 NOTE — Telephone Encounter (Signed)
Pt left VM this morning asking if she can have labs done prior to office so results will be available to Dr. Alvy Bimler for visit?  Per Dr. Alvy Bimler,  Pt needs to be seen prior to MD ordering any tests.  Dr. Alvy Bimler plans on ordering Skeletal Survey, 24 hr urine collection, labs and BMBx after she sees pt in clinic.   Called pt and explained above. She verbalized understanding.  She also wants to make sure her insurance will cover her visit here and she would like to know prior to her appt on 5/06.  I informed her I will request a financial counselor to call her back.   Left VM for Lenise in Financial counseling to please call back.

## 2013-10-28 ENCOUNTER — Other Ambulatory Visit (INDEPENDENT_AMBULATORY_CARE_PROVIDER_SITE_OTHER): Payer: Medicare HMO

## 2013-10-28 ENCOUNTER — Ambulatory Visit (INDEPENDENT_AMBULATORY_CARE_PROVIDER_SITE_OTHER): Payer: Medicare HMO | Admitting: Physician Assistant

## 2013-10-28 ENCOUNTER — Encounter: Payer: Self-pay | Admitting: Physician Assistant

## 2013-10-28 ENCOUNTER — Ambulatory Visit (INDEPENDENT_AMBULATORY_CARE_PROVIDER_SITE_OTHER)
Admission: RE | Admit: 2013-10-28 | Discharge: 2013-10-28 | Disposition: A | Discharge status: Home / Self Care | Payer: Medicare HMO | Source: Ambulatory Visit | Attending: Physician Assistant | Admitting: Physician Assistant

## 2013-10-28 VITALS — BP 136/66 | HR 72 | Ht 63.0 in | Wt 146.0 lb

## 2013-10-28 DIAGNOSIS — R142 Eructation: Secondary | ICD-10-CM

## 2013-10-28 DIAGNOSIS — R6881 Early satiety: Secondary | ICD-10-CM

## 2013-10-28 DIAGNOSIS — R143 Flatulence: Secondary | ICD-10-CM

## 2013-10-28 DIAGNOSIS — R140 Abdominal distension (gaseous): Secondary | ICD-10-CM

## 2013-10-28 DIAGNOSIS — R634 Abnormal weight loss: Secondary | ICD-10-CM

## 2013-10-28 DIAGNOSIS — R141 Gas pain: Secondary | ICD-10-CM

## 2013-10-28 LAB — CBC WITH DIFFERENTIAL/PLATELET
HCT: 30.3 % — ABNORMAL LOW (ref 36.0–46.0)
Hemoglobin: 10.4 g/dL — ABNORMAL LOW (ref 12.0–15.0)
MCHC: 34.4 g/dL (ref 30.0–36.0)
MCV: 97.9 fl (ref 78.0–100.0)
Platelets: 130 10*3/uL — ABNORMAL LOW (ref 150.0–400.0)
RBC: 3.1 Mil/uL — ABNORMAL LOW (ref 3.87–5.11)
RDW: 17.1 % — ABNORMAL HIGH (ref 11.5–14.6)
WBC: 7 10*3/uL (ref 4.5–10.5)

## 2013-10-28 LAB — BASIC METABOLIC PANEL
BUN: 11 mg/dL (ref 6–23)
CO2: 28 mEq/L (ref 19–32)
Calcium: 9.3 mg/dL (ref 8.4–10.5)
Chloride: 99 mEq/L (ref 96–112)
Creatinine, Ser: 0.8 mg/dL (ref 0.4–1.2)
GFR: 73.79 mL/min (ref >60.00)
Glucose, Bld: 100 mg/dL — ABNORMAL HIGH (ref 70–99)
Potassium: 3.9 mEq/L (ref 3.5–5.1)
Sodium: 138 mEq/L (ref 135–145)

## 2013-10-28 MED ORDER — IOHEXOL 300 MG/ML  SOLN
100.0000 mL | Freq: Once | INTRAMUSCULAR | Status: AC | PRN
  Administered 2013-10-28: 100 mL via INTRAVENOUS

## 2013-10-28 NOTE — Progress Notes (Signed)
Subjective:    Patient ID: Jean Davidson, female    DOB: 09/22/1945, 68 y.o.   MRN: [id number]  HPI Jean Davidson is a pleasant 68 year old white female new to GI today. She is referred by Jean Davidson . She comes in with complaints of 3 month history of change in appetite with significant anorexia and weight loss of about 18 pounds over the past 3 months. She denies any abdominal pain but says that she fills up very quickly and the food does not appeal to her. No dysphagia or odynophagia. She has had some intermittent urgency and loose stools though not on a consistent basis and no melena or hematochezia.  She had also noticed a new skin sensitivity over the past couple of months. She says she had a strained symptom of her skin feeling very uncomfortable and burning even with shaving. She had been evaluated by dermatology within the past 2 weeks and had labs done. She is noted to have a sedimentation rate of the 138, rheumatoid factor is negative ANA negative and serum protein electrophoresis shows a significant IgA elevation at 3210.  Review of her previous labs from February of 2015 showed a hemoglobin of 10.6 hematocrit of 32 MCV of 97 and platelet count of 122. Patient is not sure that she has had problems with her platelet count in the past but think she may have had some mild anemia previously. She has an appointment to see Jean Davidson/oncology tomorrow.   Review of Systems  Constitutional: Positive for appetite change, fatigue and unexpected weight change.  HENT: Negative.   Eyes: Negative.   Respiratory: Positive for cough.   Cardiovascular: Negative.   Gastrointestinal: Negative.   Endocrine: Positive for cold intolerance.  Genitourinary: Negative.   Musculoskeletal: Negative.   Allergic/Immunologic: Negative.   Neurological: Negative.   Hematological: Negative.   Psychiatric/Behavioral: The patient is nervous/anxious.    Outpatient Prescriptions Prior to Visit  Medication Sig Dispense  Refill  . albuterol (PROVENTIL HFA;VENTOLIN HFA) 108 (90 BASE) MCG/ACT inhaler Inhale 2 puffs into the lungs every 4 (four) hours as needed for wheezing or shortness of breath (or coughing fit).  1 Inhaler  0  . carvedilol (COREG) 6.25 MG tablet Take 3.125 mg by mouth 2 (two) times daily with a meal.      . FIBER PO Take by mouth as needed.      . Multiple Vitamins-Minerals (CENTRUM SILVER ULTRA WOMENS PO) Take by mouth daily.      . Omeprazole (PRILOSEC PO) Take by mouth as needed.      . Probiotic Product (ALIGN) 4 MG CAPS Take 1 capsule by mouth daily.      Marland Kitchen zoster vaccine live, PF, (ZOSTAVAX) 63875 UNT/0.65ML injection Inject 19,400 Units into the skin once.  1 each  0   No facility-administered medications prior to visit.   Allergies  Allergen Reactions  . Penicillins   . Sulfa Antibiotics    Patient Active Problem List   Diagnosis Date Noted  . Persistent cough for 3 weeks or longer 10/02/2013  . Abnormal weight loss 10/02/2013  . Normocytic anemia 08/22/2013  . Abdominal bloating 08/22/2013  . Hypersensitivity 08/07/2013  . Other malaise and fatigue 08/07/2013  . Osteopenia 08/05/2013  . GERD (gastroesophageal reflux disease) 01/16/2013  . Insomnia 01/16/2013  . Carotid stenosis   . Cervical disc disease   . Hypertension 04/15/2012  . Palpitations 03/29/2012   History  Substance Use Topics  . Smoking status: Former Smoker -- 0.50 packs/day  for 4 years    Quit date: 06/26/1968  . Smokeless tobacco: Never Used  . Alcohol Use: 4.8 oz/week    8 Cans of beer per week     Comment: minimal   family history includes Colon cancer (age of onset: 58) in her paternal aunt; Diabetes in her father; Heart disease in her brother, father, and mother; Heart failure in her father; Hypertension in her brother; Kidney disease in her father; Skin cancer in her brother and father; Throat cancer in her father.     Objective:   Physical Exam well-developed older white female in no acute  distress, pleasant, anxious blood pressure 136/66 pulse 72 height 5 foot 3 weight 146. HEENT; nontraumatic normocephalic EOMI PERRLA sclera anicteric, Supple ;no JVD, Cardiovascular ;regular rate and rhythm with S1-S2 no murmur or gallop, Pulmonary; clear bilaterally, Abdomen; soft nontender nondistended bowel sounds are present there is no palpable mass or hepatosplenomegaly, Rectal; exam not done, Extremities; no clubbing cyanosis or edema skin warm and dry no skin lesions, Psych; mood and affect appropriate ,she is anxious         Assessment & Plan:  #45 68 year old female with multiple constitutional symptoms including anorexia, 18 pound weight loss early satiety. I suspect these are secondary to an underlying systemic disease and not primary GI issues. Will rule out intra-abdominal malignancy, gastroparesis. #2 skin sensitivity #3 normocytic anemia #4 thrombocytopenia #5 markedly elevated sedimentation rate #6 positive serum protein electrophoresis with IgA spike-rule out multiple myeloma #7 colon neoplasia screening patient had colonoscopy 2011, [place]-negative  Plan; Will repeat CBC with differential today, check BMET Schedule for CT of the abdomen and pelvis tomorrow with contrast Advised patient that she should pursue hematology oncology workup first and we will follow their  guidance regarding any need for further GI/endoscopic evaluation She will be established with Jean Davidson

## 2013-10-28 NOTE — Patient Instructions (Addendum)
Please go to the basement level to have your labs drawn.    You have been scheduled for a CT scan of the abdomen and pelvis at [facility] ([address]---this is in the same building as [facility]).   You are scheduled today 10-28-2013. You should arrive at 1:30 PM  prior to your appointment time for registration. Please follow the written instructions below on the day of your exam:  WARNING: IF YOU ARE ALLERGIC TO IODINE/X-RAY DYE, PLEASE NOTIFY RADIOLOGY IMMEDIATELY AT [phone]! YOU WILL BE GIVEN A 13 HOUR PREMEDICATION PREP.  1) Do not eat or drink anything after 9:45 am  (4 hours prior to your test) 2) You have been given 2 bottles of oral contrast to drink. The solution may taste better if refrigerated, but do NOT add ice or any other liquid to this solution. Shake well before drinking.    Drink 1 bottle of contrast @ 11:45 am (2 hours prior to your exam)  Drink 1 bottle of contrast @ 12:45 am  (1 hour prior to your exam)  You may take any medications as prescribed with a small amount of water except for the following: Metformin, Glucophage, Glucovance, Avandamet, Riomet, Fortamet, Actoplus Met, Janumet, Glumetza or Metaglip. The above medications must be held the day of the exam AND 48 hours after the exam.  The purpose of you drinking the oral contrast is to aid in the visualization of your intestinal tract. The contrast solution may cause some diarrhea. Before your exam is started, you will be given a small amount of fluid to drink. Depending on your individual set of symptoms, you may also receive an intravenous injection of x-ray contrast/dye. Plan on being at [facility] for 30 minutes or long, depending on the type of exam you are having performed.  If you have any questions regarding your exam or if you need to reschedule, you may call the CT department at [phone] between the hours of 8:00 am and 5:00 pm, Monday-Friday.    

## 2013-10-29 ENCOUNTER — Encounter: Payer: Self-pay | Admitting: Hematology and Oncology

## 2013-10-29 ENCOUNTER — Ambulatory Visit (HOSPITAL_BASED_OUTPATIENT_CLINIC_OR_DEPARTMENT_OTHER): Payer: Medicare HMO | Admitting: Hematology and Oncology

## 2013-10-29 ENCOUNTER — Ambulatory Visit (HOSPITAL_COMMUNITY)
Admission: RE | Admit: 2013-10-29 | Discharge: 2013-10-29 | Disposition: A | Discharge status: Home / Self Care | Payer: Medicare HMO | Source: Ambulatory Visit | Attending: Hematology and Oncology | Admitting: Hematology and Oncology

## 2013-10-29 ENCOUNTER — Ambulatory Visit: Payer: Medicare HMO

## 2013-10-29 ENCOUNTER — Telehealth: Payer: Self-pay | Admitting: Physician Assistant

## 2013-10-29 ENCOUNTER — Telehealth: Payer: Self-pay | Admitting: Hematology and Oncology

## 2013-10-29 ENCOUNTER — Ambulatory Visit (HOSPITAL_BASED_OUTPATIENT_CLINIC_OR_DEPARTMENT_OTHER): Payer: Medicare HMO

## 2013-10-29 VITALS — BP 148/69 | HR 88 | Temp 98.0°F | Resp 18 | Ht 63.0 in | Wt 146.7 lb

## 2013-10-29 DIAGNOSIS — F411 Generalized anxiety disorder: Secondary | ICD-10-CM

## 2013-10-29 DIAGNOSIS — D472 Monoclonal gammopathy: Secondary | ICD-10-CM | POA: Insufficient documentation

## 2013-10-29 DIAGNOSIS — M47812 Spondylosis without myelopathy or radiculopathy, cervical region: Secondary | ICD-10-CM | POA: Insufficient documentation

## 2013-10-29 DIAGNOSIS — D61818 Other pancytopenia: Secondary | ICD-10-CM | POA: Insufficient documentation

## 2013-10-29 DIAGNOSIS — G4700 Insomnia, unspecified: Secondary | ICD-10-CM

## 2013-10-29 DIAGNOSIS — D696 Thrombocytopenia, unspecified: Secondary | ICD-10-CM

## 2013-10-29 DIAGNOSIS — F419 Anxiety disorder, unspecified: Secondary | ICD-10-CM

## 2013-10-29 DIAGNOSIS — R630 Anorexia: Secondary | ICD-10-CM

## 2013-10-29 DIAGNOSIS — M19019 Primary osteoarthritis, unspecified shoulder: Secondary | ICD-10-CM | POA: Insufficient documentation

## 2013-10-29 DIAGNOSIS — D630 Anemia in neoplastic disease: Secondary | ICD-10-CM

## 2013-10-29 DIAGNOSIS — D649 Anemia, unspecified: Secondary | ICD-10-CM

## 2013-10-29 MED ORDER — MIRTAZAPINE 15 MG PO TABS: 15.0000 mg | ORAL_TABLET | Freq: Every day | ORAL | Status: DC

## 2013-10-29 MED ORDER — ALPRAZOLAM 0.5 MG PO TABS: 0.5000 mg | ORAL_TABLET | Freq: Two times a day (BID) | ORAL | Status: DC | PRN

## 2013-10-29 NOTE — Progress Notes (Signed)
Agree with Ms. Esterwood's assessment and plan. Ohanna Gassert E. Earlyne Feeser, MD, FACG   

## 2013-10-29 NOTE — Progress Notes (Signed)
Checked in new patient with no financial issues because she has not seen the dr as of yet.

## 2013-10-29 NOTE — Telephone Encounter (Signed)
Patient is calling for CT results. Please, advise. 

## 2013-10-29 NOTE — Progress Notes (Signed)
[facility] NOTE  Patient Care Team: Jinny Sanders, MD as PCP - General (Family Medicine)  CHIEF COMPLAINTS/PURPOSE OF CONSULTATION:  Elevated IgA M spike, anemia, thrombocytopenia, suspicious for diagnosis of multiple myeloma  HISTORY OF PRESENTING ILLNESS:  Jean Davidson 68 y.o. female is here because of abnormal blood work as above. She was seen by her dermatologist for abnormal changes in the legs, consistent with livedo reticularis. On review of the blood work, the patient has significant elevated total protein with anemia and thrombocytopenia. Serum protein electrophoresis showed 2 restricted bands consistent with monoclonal proteins with an estimated M spike of 3.54 g. She denies history of abnormal bone pain or bone fracture. Patient denies history of recurrent infection or atypical infections such as shingles of meningitis. Denies chills or night sweats. She has significant anorexia with an estimated 15 pounds abnormal weight loss. The patient has significant anxiety and difficulties with sleeping. She denies depression.  MEDICAL HISTORY:  Past Medical History  Diagnosis Date  . Cervical disc disease     c7  . Spinal stenosis in cervical region   . Carpal tunnel syndrome   . Pinched nerve   . Varicose veins of lower extremities with inflammation   . Disc degeneration   . Hypertension     Borderline.  . Carotid stenosis     Mild bilateral  . Anemia   . Anxiety   . GERD (gastroesophageal reflux disease)     SURGICAL HISTORY: Past Surgical History  Procedure Laterality Date  . Colonoscopy    . Bladder tact      x2  . Shoulder surgery      right    SOCIAL HISTORY: History   Social History  . Marital Status: Divorced    Spouse Name: N/A    Number of Children: N/A  . Years of Education: N/A   Occupational History  . Not on file.   Social History Main Topics  . Smoking status: Former Smoker -- 0.50 packs/day for 4 years    Quit  date: 06/26/1968  . Smokeless tobacco: Never Used  . Alcohol Use: 4.8 oz/week    8 Cans of beer per week     Comment: minimal  . Drug Use: No  . Sexual Activity: Not on file   Other Topics Concern  . Not on file   Social History Narrative   Chauncey Reading is daughter guelda, batson will,  full code ( reviewed 87)    FAMILY HISTORY: Family History  Problem Relation Age of Onset  . Heart disease Mother   . Heart failure Father   . Heart disease Father   . Hypertension Brother   . Heart disease Brother     congenital shunt, ?   . Colon cancer Paternal Aunt 47  . Throat cancer Father   . Skin cancer Father   . Skin cancer Brother   . Diabetes Father   . Kidney disease Father     ALLERGIES:  is allergic to penicillins and sulfa antibiotics.  MEDICATIONS:  Current Outpatient Prescriptions  Medication Sig Dispense Refill  . carvedilol (COREG) 6.25 MG tablet Take 3.125 mg by mouth 2 (two) times daily with a meal.      . FIBER PO Take by mouth as needed.      . Multiple Vitamins-Minerals (CENTRUM SILVER ULTRA WOMENS PO) Take by mouth daily.      . Omeprazole (PRILOSEC PO) Take by mouth as needed.      Marland Kitchen  Probiotic Product (ALIGN) 4 MG CAPS Take 1 capsule by mouth daily.      Marland Kitchen albuterol (PROVENTIL HFA;VENTOLIN HFA) 108 (90 BASE) MCG/ACT inhaler Inhale 2 puffs into the lungs every 4 (four) hours as needed for wheezing or shortness of breath (or coughing fit).  1 Inhaler  0  . ALPRAZolam (XANAX) 0.5 MG tablet Take 1 tablet (0.5 mg total) by mouth 2 (two) times daily as needed for anxiety.  30 tablet  0  . mirtazapine (REMERON) 15 MG tablet Take 1 tablet (15 mg total) by mouth at bedtime.  30 tablet  0   No current facility-administered medications for this visit.    REVIEW OF SYSTEMS:   Eyes: Denies blurriness of vision, double vision or watery eyes Ears, nose, mouth, throat, and face: Denies mucositis or sore throat Respiratory: Denies cough, dyspnea or  wheezes Cardiovascular: Denies palpitation, chest discomfort or lower extremity swelling Gastrointestinal:  Denies nausea, heartburn or change in bowel habits Lymphatics: Denies new lymphadenopathy or easy bruising Neurological:Denies numbness, tingling or new weaknesses All other systems were reviewed with the patient and are negative.  PHYSICAL EXAMINATION: ECOG PERFORMANCE STATUS: 1 - Symptomatic but completely ambulatory  Filed Vitals:   10/29/13 1443  BP: 148/69  Pulse: 88  Temp: 98 F (36.7 C)  Resp: 18   Filed Weights   10/29/13 1443  Weight: 146 lb 11.2 oz (66.543 kg)    GENERAL:alert, no distress and comfortable. She appears very anxious SKIN: skin color, texture, turgor are normal, no rashes or significant lesions EYES: normal, conjunctiva are pink and non-injected, sclera clear OROPHARYNX:no exudate, no erythema and lips, buccal mucosa, and tongue normal  NECK: supple, thyroid normal size, non-tender, without nodularity LYMPH:  no palpable lymphadenopathy in the cervical, axillary or inguinal LUNGS: clear to auscultation and percussion with normal breathing effort HEART: regular rate & rhythm and no murmurs and no lower extremity edema ABDOMEN:abdomen soft, non-tender and normal bowel sounds Musculoskeletal:no cyanosis of digits and no clubbing  PSYCH: alert & oriented x 3 with fluent speech NEURO: no focal motor/sensory deficits  LABORATORY DATA:  I have reviewed the data as listed Lab Results  Component Value Date   WBC 7.0 10/28/2013   HGB 10.4* 10/28/2013   HCT 30.3* 10/28/2013   MCV 97.9 10/28/2013   PLT 130.0* 10/28/2013    RADIOGRAPHIC STUDIES: I have personally reviewed the radiological images as listed and agreed with the findings in the report. Dg Chest 2 View  10/02/2013   CLINICAL DATA:  Chronic cough.  EXAM: CHEST - 2 VIEW  COMPARISON:  None  FINDINGS: Calcification overlying the lower right lung zone may represent a calcified granuloma or bone island in  a rib. There is no evidence of pulmonary edema, consolidation, pneumothorax or pleural fluid. The heart size and mediastinal contours are normal.  IMPRESSION: No active disease.   Electronically Signed   By: Aletta Edouard M.D.   On: 10/02/2013 16:58   Ct Abdomen Pelvis W Contrast  10/28/2013   CLINICAL DATA:  Abdominal bloating. Decreased appetite. 17 pound weight loss.  EXAM: CT ABDOMEN AND PELVIS WITH CONTRAST  TECHNIQUE: Multidetector CT imaging of the abdomen and pelvis was performed using the standard protocol following bolus administration of intravenous contrast.  CONTRAST:  184m OMNIPAQUE IOHEXOL 300 MG/ML  SOLN  COMPARISON:  None.  FINDINGS: Bones: No aggressive osseous lesions. Degenerative grade I retrolisthesis of L3 on L4 associated with collapse of the disc space. Lower lumbar facet arthrosis is present.  Lung Bases: Clear.  Liver: Tiny subcentimeter low-density lesion in the left hepatic lobe (segment 2) compatible with a cyst.  Spleen:  Normal.  Gallbladder:  Normal.  Common bile duct:  Normal.  Pancreas:  Pancreas normal.  Adrenal glands:  Normal.  Kidneys: Normal renal enhancement and delayed excretion of contrast. Bilateral small subcentimeter low-density lesions are present in the kidneys. Most of these probably represents cysts. 8 mm lesion in the interpolar right kidney appears to demonstrates some enhancement and washout. Other lesions are too small to characterize. Both ureters appear within normal limits.  Stomach:  Small hiatal hernia.  Stomach appears normal.  Small bowel: No inflammatory changes are mesenteric adenopathy. Periampullary duodenal diverticulum.  Colon: Normal appendix. Redundant transverse colon. Otherwise the colon appears within normal limits.  Pelvic Genitourinary: Urinary bladder is collapsed. Diminutive uterus which may represent atrophy or partial hysterectomy. No free fluid.  Vasculature: Minimal atherosclerosis.  Body Wall: Tiny fat containing periumbilical  hernia.  IMPRESSION: 1. No acute abdominal abnormality. 2. Indeterminate 8 mm right interpolar renal lesion appears to demonstrates some enhancement. Followup renal MRI with and without contrast recommended for further assessment. Small solid renal neoplasm could have this appearance. 3. Small hiatal hernia. 4. Probable benign hepatic and renal cysts.   Electronically Signed   By: Dereck Ligas M.D.   On: 10/28/2013 15:51   ASSESSMENT:  We discussed the approach for workup for multiple myeloma  PLAN:  #1 MGUS #2 anemia #3 thrombocytopenia I suspect she has multiple myeloma due to greater than 3 g of M spike and presence of anemia and thrombocytopenia. I recommend complete blood work, 24 hour urine collection for UPEP and skeletal survey. She will also need bone marrow aspirate and biopsy. I plan to order x-ray today, bone marrow aspirate and biopsy with blood work this week, chemotherapy education class next week & return appointment to review everything next week. #4 anxiety #5 anorexia #6 difficulties with sleeping I recommend starting her on Remeron daily and Xanax when necessary for anxiety. I warned her about expected side effects.  Orders Placed This Encounter  Procedures  . DG Bone Survey Met    Standing Status: Future     Number of Occurrences: 1     Standing Expiration Date: 12/29/2014    Order Specific Question:  Reason for Exam (SYMPTOM  OR DIAGNOSIS REQUIRED)    Answer:  staging myeloma    Order Specific Question:  Preferred imaging location?    Answer:  [facility]  . CBC with Differential    Standing Status: Future     Number of Occurrences:      Standing Expiration Date: 10/29/2014  . Comprehensive metabolic panel    Standing Status: Future     Number of Occurrences:      Standing Expiration Date: 10/29/2014  . Lactate dehydrogenase    Standing Status: Future     Number of Occurrences:      Standing Expiration Date: 10/29/2014  . Beta 2 microglobulin, serum     Standing Status: Future     Number of Occurrences:      Standing Expiration Date: 10/30/2014  . IFE, Urine (with Tot Prot)    Standing Status: Future     Number of Occurrences:      Standing Expiration Date: 10/30/2014  . Kappa/lambda light chains    Standing Status: Future     Number of Occurrences:      Standing Expiration Date: 10/30/2014  . SPEP & IFE  with QIG    Standing Status: Future     Number of Occurrences:      Standing Expiration Date: 10/30/2014  . Protein Electro, 24-Hour Urine    Standing Status: Future     Number of Occurrences:      Standing Expiration Date: 10/30/2014    All questions were answered. The patient knows to call the clinic with any problems, questions or concerns. I spent 40 minutes counseling the patient face to face. The total time spent in the appointment was 60 minutes and more than 50% was on counseling.     [name], MD 10/29/2013 4:33 PM

## 2013-10-29 NOTE — Telephone Encounter (Signed)
gv adn pritned appt scehd adn avs for pt for May.Marland KitchenMarland KitchenMarland Kitchen

## 2013-10-29 NOTE — Telephone Encounter (Signed)
See note on ct report

## 2013-10-30 ENCOUNTER — Other Ambulatory Visit: Payer: Medicare HMO

## 2013-10-30 ENCOUNTER — Ambulatory Visit: Payer: Medicare HMO

## 2013-10-30 ENCOUNTER — Telehealth: Payer: Self-pay | Admitting: *Deleted

## 2013-10-30 ENCOUNTER — Other Ambulatory Visit: Payer: Self-pay | Admitting: Hematology and Oncology

## 2013-10-30 NOTE — Telephone Encounter (Signed)
Pt called to say she is having trouble eating anything.. Is lactose intolerant and tried Ensure for lactose intolerance and it did not work. RN spoke with dietician who suggested peanut butter, yogurt, and Ensure Clear. Dietician will try to call her today or tomorrow and will mail her a food list. Discussed with patient.

## 2013-10-31 ENCOUNTER — Other Ambulatory Visit (HOSPITAL_BASED_OUTPATIENT_CLINIC_OR_DEPARTMENT_OTHER): Payer: Medicare HMO

## 2013-10-31 ENCOUNTER — Encounter (HOSPITAL_COMMUNITY): Payer: Self-pay

## 2013-10-31 ENCOUNTER — Ambulatory Visit (HOSPITAL_COMMUNITY)
Admission: RE | Admit: 2013-10-31 | Discharge: 2013-10-31 | Disposition: A | Discharge status: Home / Self Care | Payer: Medicare HMO | Source: Ambulatory Visit | Attending: Hematology and Oncology | Admitting: Hematology and Oncology

## 2013-10-31 ENCOUNTER — Other Ambulatory Visit: Payer: Self-pay | Admitting: *Deleted

## 2013-10-31 VITALS — BP 133/63 | HR 80 | Temp 98.0°F | Resp 18 | Ht 63.0 in | Wt 146.7 lb

## 2013-10-31 DIAGNOSIS — D472 Monoclonal gammopathy: Secondary | ICD-10-CM

## 2013-10-31 DIAGNOSIS — R634 Abnormal weight loss: Secondary | ICD-10-CM

## 2013-10-31 DIAGNOSIS — C9000 Multiple myeloma not having achieved remission: Secondary | ICD-10-CM

## 2013-10-31 LAB — CBC WITH DIFFERENTIAL/PLATELET
Basophils Absolute: 0 10*3/uL (ref 0.0–0.1)
Basophils Relative: 0 % (ref 0–1)
Eosinophils Absolute: 0 10*3/uL (ref 0.0–0.7)
Eosinophils Relative: 0 % (ref 0–5)
HCT: 29.2 % — ABNORMAL LOW (ref 36.0–46.0)
Hemoglobin: 9.7 g/dL — ABNORMAL LOW (ref 12.0–15.0)
Lymphocytes Relative: 29 % (ref 12–46)
Lymphs Abs: 2 10*3/uL (ref 0.7–4.0)
MCH: 33.1 pg (ref 26.0–34.0)
MCHC: 33.2 g/dL (ref 30.0–36.0)
MCV: 99.7 fL (ref 78.0–100.0)
Monocytes Absolute: 1.8 10*3/uL — ABNORMAL HIGH (ref 0.1–1.0)
Monocytes Relative: 26 % — ABNORMAL HIGH (ref 3–12)
Neutro Abs: 3 10*3/uL (ref 1.7–7.7)
Neutrophils Relative %: 44 % (ref 43–77)
Platelets: 115 10*3/uL — ABNORMAL LOW (ref 150–400)
RBC: 2.93 MIL/uL — ABNORMAL LOW (ref 3.87–5.11)
RDW: 16.4 % — ABNORMAL HIGH (ref 11.5–15.5)
WBC: 6.8 10*3/uL (ref 4.0–10.5)

## 2013-10-31 LAB — BONE MARROW EXAM

## 2013-10-31 LAB — COMPREHENSIVE METABOLIC PANEL (CC13)
ALT: 9 U/L (ref 0–55)
AST: 18 U/L (ref 5–34)
Albumin: 3.6 g/dL (ref 3.5–5.0)
Alkaline Phosphatase: 42 U/L (ref 40–150)
Anion Gap: 10 mEq/L (ref 3–11)
BUN: 9.8 mg/dL (ref 7.0–26.0)
CO2: 24 mEq/L (ref 22–29)
Calcium: 9.2 mg/dL (ref 8.4–10.4)
Chloride: 104 mEq/L (ref 98–109)
Creatinine: 0.8 mg/dL (ref 0.6–1.1)
Glucose: 132 mg/dl (ref 70–140)
Potassium: 3.8 mEq/L (ref 3.5–5.1)
Sodium: 139 mEq/L (ref 136–145)
Total Bilirubin: 0.45 mg/dL (ref 0.20–1.20)
Total Protein: 8.9 g/dL — ABNORMAL HIGH (ref 6.4–8.3)

## 2013-10-31 LAB — LACTATE DEHYDROGENASE (CC13): LDH: 117 U/L — ABNORMAL LOW (ref 125–245)

## 2013-10-31 MED ORDER — MIDAZOLAM HCL 5 MG/5ML IJ SOLN
INTRAMUSCULAR | Status: AC | PRN
  Administered 2013-10-31: 1 mg via INTRAVENOUS

## 2013-10-31 MED ORDER — MIDAZOLAM HCL 2 MG/2ML IJ SOLN
INTRAMUSCULAR | Status: AC | PRN
  Administered 2013-10-31: 1 mg via INTRAVENOUS
  Administered 2013-10-31: 2 mg via INTRAVENOUS
  Administered 2013-10-31 (×2): 1 mg via INTRAVENOUS

## 2013-10-31 MED ORDER — MORPHINE SULFATE 10 MG/ML IJ SOLN
INTRAMUSCULAR | Status: AC | PRN
  Administered 2013-10-31: 1 mg via INTRAVENOUS
  Administered 2013-10-31: 2 mg via INTRAVENOUS
  Administered 2013-10-31 (×3): 1 mg via INTRAVENOUS

## 2013-10-31 MED ORDER — MIDAZOLAM HCL 10 MG/2ML IJ SOLN
10.0000 mg | Freq: Once | INTRAMUSCULAR | Status: DC
  Filled 2013-10-31: qty 2

## 2013-10-31 MED ORDER — MORPHINE SULFATE 10 MG/ML IJ SOLN
10.0000 mg | Freq: Once | INTRAMUSCULAR | Status: DC
  Filled 2013-10-31: qty 1

## 2013-10-31 MED ORDER — SODIUM CHLORIDE 0.9 % IV SOLN: Freq: Once | INTRAVENOUS | Status: DC

## 2013-10-31 NOTE — Sedation Documentation (Signed)
Dressing left post iliac area CDI 

## 2013-10-31 NOTE — Sedation Documentation (Signed)
Patient denies pain and is resting comfortably.  

## 2013-10-31 NOTE — Discharge Instructions (Signed)
Do not drive  For 24 hours Do not go into public places today May resume your regular diet and take home medications as usual May experience small amount of tingling in leg (biopsy side) May take shower and remove bandage in am For any questions or concerns, call Dr Alvy Bimler [phone] If bleeding occurs at site, hold pressure x10 minutes  If continues, call Dr Alvy Bimler [phone]  Bone Marrow Aspiration, Bone Marrow Biopsy Care After Read the instructions outlined below and refer to this sheet in the next few weeks. These discharge instructions provide you with general information on caring for yourself after you leave the hospital. Your caregiver may also give you specific instructions. While your treatment has been planned according to the most current medical practices available, unavoidable complications occasionally occur. If you have any problems or questions after discharge, call your caregiver. FINDING OUT THE RESULTS OF YOUR TEST Not all test results are available during your visit. If your test results are not back during the visit, make an appointment with your caregiver to find out the results. Do not assume everything is normal if you have not heard from your caregiver or the medical facility. It is important for you to follow up on all of your test results.  HOME CARE INSTRUCTIONS  You have had sedation and may be sleepy or dizzy. Your thinking may not be as clear as usual. For the next 24 hours:  Only take over-the-counter or prescription medicines for pain, discomfort, and or fever as directed by your caregiver.  Do not drink alcohol.  Do not smoke.  Do not drive.  Do not make important legal decisions.  Do not operate heavy machinery.  Do not care for small children by yourself.  Keep your dressing clean and dry. You may replace dressing with a bandage after 24 hours.  You may take a bath or shower after 24 hours.  Use an ice pack for 20 minutes every 2 hours while awake  for pain as needed. SEEK MEDICAL CARE IF:   There is redness, swelling, or increasing pain at the biopsy site.  There is pus coming from the biopsy site.  There is drainage from a biopsy site lasting longer than one day.  An unexplained oral temperature above 102 F (38.9 C) develops. SEEK IMMEDIATE MEDICAL CARE IF:   You develop a rash.  You have difficulty breathing.  You develop any reaction or side effects to medications given. Document Released: 12/30/2004 Document Revised: 09/04/2011 Document Reviewed: 06/09/2008 [facility] Patient Information 2014 [place]. Conscious Sedation, Adult, Care After Refer to this sheet in the next few weeks. These instructions provide you with information on caring for yourself after your procedure. Your health care provider may also give you more specific instructions. Your treatment has been planned according to current medical practices, but problems sometimes occur. Call your health care provider if you have any problems or questions after your procedure. WHAT TO EXPECT AFTER THE PROCEDURE  After your procedure:  You may feel sleepy, clumsy, and have poor balance for several hours.  Vomiting may occur if you eat too soon after the procedure. HOME CARE INSTRUCTIONS  Do not participate in any activities where you could become injured for at least 24 hours. Do not:  Drive.  Swim.  Ride a bicycle.  Operate heavy machinery.  Cook.  Use power tools.  Climb ladders.  Work from a high place.  Do not make important decisions or sign legal documents until  you are improved.  If you vomit, drink water, juice, or soup when you can drink without vomiting. Make sure you have little or no nausea before eating solid foods.  Only take over-the-counter or prescription medicines for pain, discomfort, or fever as directed by your health care provider.  Make sure you and your family fully understand everything about the medicines given to  you, including what side effects may occur.  You should not drink alcohol, take sleeping pills, or take medicines that cause drowsiness for at least 24 hours.  If you smoke, do not smoke without supervision.  If you are feeling better, you may resume normal activities 24 hours after you were sedated.  Keep all appointments with your health care provider. SEEK MEDICAL CARE IF:  Your skin is pale or bluish in color.  You continue to feel nauseous or vomit.  Your pain is getting worse and is not helped by medicine.  You have bleeding or swelling.  You are still sleepy or feeling clumsy after 24 hours. SEEK IMMEDIATE MEDICAL CARE IF:  You develop a rash.  You have difficulty breathing.  You develop any type of allergic problem.  You have a fever. MAKE SURE YOU:  Understand these instructions.  Will watch your condition.  Will get help right away if you are not doing well or get worse. Document Released: 04/02/2013 Document Reviewed: 01/17/2013 [facility] Patient Information 2014 [place], [place].

## 2013-10-31 NOTE — Sedation Documentation (Signed)
Patient is resting comfortably. 

## 2013-10-31 NOTE — Sedation Documentation (Signed)
Ambulated in room with minimal assist and tolerated this well. Dressing reamins CDI

## 2013-10-31 NOTE — Sedation Documentation (Signed)
Dressing CDI 

## 2013-10-31 NOTE — Sedation Documentation (Signed)
Medication dose calculated and verified TXH:FSFSEL 6mg  IV and MORPHINE 6 mg IV

## 2013-10-31 NOTE — Sedation Documentation (Signed)
Procedure ends. Dressing to righ posterior iliac area with 2x2 and hypafix for pressure. Pt carefully placed from prone to  Supine position with pressure to biopsy site

## 2013-10-31 NOTE — Procedures (Signed)
Brief examination was performed. ENT: adequate airway clearance Heart: regular rate and rhythm.No Murmurs Lungs: clear to auscultation, no wheezes, normal respiratory effort  American Society of Anesthesiologists ASA scale 2  Mallampati Score of 3  Bone Marrow Biopsy and Aspiration Procedure Note   Informed consent was obtained and potential risks including bleeding, infection and pain were reviewed with the patient. I verified that the patient has been fasting since midnight.  The patient's name, date of birth, identification, consent and allergies were verified prior to the start of procedure and time out was performed.  A total of 6 mg of IV Versed and 6 mg of IV morphine were given.  The right posterior iliac crest was chosen as the site of biopsy.  The skin was prepped with Betadine solution.   8 cc of 1% lidocaine was used to provide local anaesthesia.   10 cc of bone marrow aspirate was obtained. I made 6 attempts to obtain bone marrow biopsy and only small bony fragments were obtained.   The procedure was tolerated well and there were no complications.  The patient was stable at the end of the procedure.  Specimens sent for flow cytometry, cytogenetics and additional studies.

## 2013-10-31 NOTE — Sedation Documentation (Signed)
Dressing CDI denies pain and is sitting in bed drinking cola

## 2013-11-01 ENCOUNTER — Telehealth: Payer: Self-pay | Admitting: Hematology and Oncology

## 2013-11-01 NOTE — Telephone Encounter (Signed)
Talked to pt and gave her appt for Nutrition consult

## 2013-11-03 ENCOUNTER — Encounter: Payer: Self-pay | Admitting: Hematology and Oncology

## 2013-11-03 ENCOUNTER — Telehealth: Payer: Self-pay | Admitting: *Deleted

## 2013-11-03 ENCOUNTER — Other Ambulatory Visit: Payer: Medicare HMO

## 2013-11-03 ENCOUNTER — Telehealth: Payer: Self-pay | Admitting: Dietician

## 2013-11-03 NOTE — Telephone Encounter (Signed)
Pt left VM asking following questions;  1. Needs a tooth filling, Is it ok to get filling done before starting chemo? Yes 2. Should she take Iron supplement? Stop iron 3. Should she take Calcium supplement? Yes, bring her bottles next visit 4. Does she need to get a 2 nd opinion for Insurance purposes? No 5. Will any results be available before Friday? Unlikely 6. Should she get a Shingles vaccine? Never 7. Should she get a Flu shot? Not now, flu season is over She also concerned about some "bubbles" in her urine and her stomach making gurgling noises. She needs to call her gynecologist for evaluation of bubbles in her urine. I am not concerned with stomach gurgling noises

## 2013-11-03 NOTE — Telephone Encounter (Signed)
Pt left VM asking following questions; 1. Needs a tooth filling,  Is it ok to get filling done before starting chemo? 2. Should she take Iron supplement? 3. Should she take Calcium supplement? 4. Does she need to get a 2 nd opinion for Insurance purposes?  5. Will any results be available before Friday?  6. Should she get a Shingles vaccine?  7. Should she get a Flu shot?  She also concerned about some "bubbles" in her urine and her stomach making gurgling noises.

## 2013-11-03 NOTE — Progress Notes (Signed)
No date for treatment as of today. °

## 2013-11-03 NOTE — Telephone Encounter (Signed)
Informed pt of Dr. Calton Dach answers below.  She verbalized understanding.

## 2013-11-03 NOTE — Telephone Encounter (Signed)
Brief Outpatient Oncology Nutrition Note  Patient has been identified to be at risk on malnutrition screen.  Wt Readings from Last 10 Encounters:  10/31/13 146 lb 11 oz (66.537 kg)  10/29/13 146 lb 11.2 oz (66.543 kg)  10/28/13 146 lb (66.225 kg)  10/24/13 149 lb 8 oz (67.813 kg)  10/02/13 151 lb 12 oz (68.833 kg)  09/09/13 155 lb (70.308 kg)  09/01/13 158 lb 12 oz (72.009 kg)  08/22/13 157 lb 8 oz (71.442 kg)  08/07/13 157 lb 8 oz (71.442 kg)  01/24/13 165 lb (74.844 kg)      Dx:  MGUS  Patient of Dr. Alvy Bimler.    Called patient due to weight loss and difficulty eating.  Lactose intolerant, has tried Ensure and it did not work.  Patient is having problems eating anything last week.  Patient currently was not available and unable to leave a message.  Nutrition referral has been made last week.  Antonieta Iba, RD, LDN

## 2013-11-04 ENCOUNTER — Ambulatory Visit: Payer: Medicare HMO | Admitting: Nutrition

## 2013-11-04 ENCOUNTER — Other Ambulatory Visit: Payer: Self-pay

## 2013-11-04 ENCOUNTER — Telehealth: Payer: Self-pay | Admitting: *Deleted

## 2013-11-04 ENCOUNTER — Telehealth: Payer: Self-pay

## 2013-11-04 LAB — UPEP/TP, 24-HR URINE
Albumin: 32 %
Alpha-1-Globulin, U: 16.9 %
Alpha-2-Globulin, U: 10.8 %
Beta Globulin, U: 30.5 %
Collection Interval: 24 hours
Gamma Globulin, U: 9.8 %
Monoclonal Band 1: 5.5 %
Total Protein, Urine/Day: 470 mg/d — ABNORMAL HIGH (ref 50–100)
Total Protein, Urine: 47 mg/dL
Total Volume, Urine: 1000 mL

## 2013-11-04 LAB — UIFE/LIGHT CHAINS/TP QN, 24-HR UR
Albumin, U: DETECTED
Alpha 1, Urine: DETECTED — AB
Alpha 2, Urine: DETECTED — AB
Beta, Urine: DETECTED — AB
Free Kappa Lt Chains,Ur: 1.45 mg/dL (ref 0.14–2.42)
Free Kappa/Lambda Ratio: 0.02 ratio — ABNORMAL LOW (ref 2.04–10.37)
Free Lambda Excretion/Day: 644 mg/d
Free Lambda Lt Chains,Ur: 64.4 mg/dL — ABNORMAL HIGH (ref 0.02–0.67)
Free Lt Chn Excr Rate: 14.5 mg/d
Gamma Globulin, Urine: DETECTED — AB
Time: 24 hours
Total Protein, Urine-Ur/day: 801 mg/d — ABNORMAL HIGH (ref 10–140)
Total Protein, Urine: 80.1 mg/dL
Volume, Urine: 1000 mL

## 2013-11-04 LAB — SPEP & IFE WITH QIG
Albumin ELP: 41.9 % — ABNORMAL LOW (ref 55.8–66.1)
Alpha-1-Globulin: 2.3 % — ABNORMAL LOW (ref 2.9–4.9)
Alpha-2-Globulin: 5 % — ABNORMAL LOW (ref 7.1–11.8)
Beta 2: 41.5 % — ABNORMAL HIGH (ref 3.2–6.5)
Beta Globulin: 6.4 % (ref 4.7–7.2)
Gamma Globulin: 2.9 % — ABNORMAL LOW (ref 11.1–18.8)
IgA: 4840 mg/dL — ABNORMAL HIGH (ref 69–380)
IgG (Immunoglobin G), Serum: 293 mg/dL — ABNORMAL LOW (ref 690–1700)
IgM, Serum: 9 mg/dL — ABNORMAL LOW (ref 52–322)
M-Spike, %: 3.27 g/dL
Total Protein, Serum Electrophoresis: 8.9 g/dL — ABNORMAL HIGH (ref 6.0–8.3)

## 2013-11-04 LAB — BETA 2 MICROGLOBULIN, SERUM: Beta-2 Microglobulin: 4.52 mg/L — ABNORMAL HIGH (ref <=2.51)

## 2013-11-04 LAB — KAPPA/LAMBDA LIGHT CHAINS
Kappa free light chain: 0.16 mg/dL — ABNORMAL LOW (ref 0.33–1.94)
Kappa:Lambda Ratio: 0 — ABNORMAL LOW (ref 0.26–1.65)
Lambda Free Lght Chn: 75.4 mg/dL — ABNORMAL HIGH (ref 0.57–2.63)

## 2013-11-04 MED ORDER — CARVEDILOL 6.25 MG PO TABS: 3.1250 mg | ORAL_TABLET | Freq: Two times a day (BID) | ORAL | Status: DC

## 2013-11-04 NOTE — Telephone Encounter (Signed)
Pt reports soonest available appt w/ Dentist for filling is Monday 5/18.  If this is NOT ok then she needs to call them to reschedule asap.

## 2013-11-04 NOTE — Telephone Encounter (Signed)
Pt would like called to [facility] phone [phone]

## 2013-11-04 NOTE — Telephone Encounter (Signed)
Ok to refill 

## 2013-11-04 NOTE — Telephone Encounter (Signed)
Patient was just diagnosed with Multiple myeloma, she will be starting chemo and doesn't feel like making any appointments at this time. She was last seen in 2013 and needs her carvedilol refilled. Please advise if okay to refill.

## 2013-11-04 NOTE — Progress Notes (Signed)
Patient is a 68 year old female diagnosed with multiple myeloma.  She is a patient of Dr. Alvy Bimler.  Past medical history includes hypertension, anemia, anxiety, and GERD.  Medications include Xanax, Remeron, multivitamin, Prilosec, and Align..  Labs include albumin 3.6.  Height: 63 inches. Weight: 146.7 pounds. Usual body weight: 165 pounds August 2014. BMI: 25.99.    Patient reports lactose intolerance as well as the inability to tolerate ensure or boost.  She complains of decreased appetite and fatigue.  She states she has dry mouth and has had dry mouth for years.  Patient reports a 10 pound weight loss recently.  Patient appears overwhelmed with diagnosis.  Nutrition diagnosis: Unintended weight loss related to poor appetite and inadequate oral intake as evidenced by 11% weight loss from usual body weight.  Intervention: Patient was educated on strategies for increasing oral intake consuming smaller, more frequent meals and snacks.  Reviewed, high-protein foods with patient.  Reviewed strategies for improving dry mouth.  Provided fact sheets for patient.  Questions were answered.  Teach back method used.  Provided samples of juice-based oral nutrition supplements.  Recommended patient consume twice a day.  Patient referred to social worker for followup as needed.  Monitoring, evaluation, goals: Patient will tolerate increased oral intake to minimize further weight loss.  Next visit: To be scheduled once treatment plan determined.

## 2013-11-04 NOTE — Telephone Encounter (Signed)
Message sent to Dr. Fletcher Anon to see if medication carvedilol can be refilled since she was last seen in 2013 and just diagnosed with multiple myloma.

## 2013-11-04 NOTE — Telephone Encounter (Signed)
No problem if she needs to wait until 5/18

## 2013-11-05 MED ORDER — CARVEDILOL 6.25 MG PO TABS: 3.1250 mg | ORAL_TABLET | Freq: Two times a day (BID) | ORAL | Status: DC

## 2013-11-05 NOTE — Telephone Encounter (Signed)
Refill sent to pharmacy.  Patient aware. 

## 2013-11-06 ENCOUNTER — Encounter: Payer: Self-pay | Admitting: Cardiovascular Disease

## 2013-11-06 ENCOUNTER — Ambulatory Visit (INDEPENDENT_AMBULATORY_CARE_PROVIDER_SITE_OTHER): Payer: Medicare HMO | Admitting: Cardiovascular Disease

## 2013-11-06 ENCOUNTER — Encounter: Payer: Self-pay | Admitting: Specialist

## 2013-11-06 VITALS — BP 161/78 | HR 71 | Ht 63.0 in | Wt 145.2 lb

## 2013-11-06 DIAGNOSIS — I10 Essential (primary) hypertension: Secondary | ICD-10-CM

## 2013-11-06 DIAGNOSIS — R002 Palpitations: Secondary | ICD-10-CM

## 2013-11-06 NOTE — Progress Notes (Signed)
HPI  This is a 68 year old female who is here today for a followup visit regarding palpitations due to premature beats. She has family history of premature coronary artery disease.  She was seen in 2013 for palpitations and exertional dyspnea. A Holter monitor  showed one PVC as well as few PACs. An echocardiogram overall was unremarkable. A treadmill stress test was normal. Due to hypotension and palpitations, she was started on carvedilol. No cardiac events since then. Unfortunately, she was recently diagnosed with multiple myeloma. She denies chest pain, worsening dyspnea or palpitations.  Allergies  Allergen Reactions  . Penicillins   . Sulfa Antibiotics      Current Outpatient Prescriptions on File Prior to Visit  Medication Sig Dispense Refill  . albuterol (PROVENTIL HFA;VENTOLIN HFA) 108 (90 BASE) MCG/ACT inhaler Inhale 2 puffs into the lungs every 4 (four) hours as needed for wheezing or shortness of breath (or coughing fit).  1 Inhaler  0  . ALPRAZolam (XANAX) 0.5 MG tablet Take 1 tablet (0.5 mg total) by mouth 2 (two) times daily as needed for anxiety.  30 tablet  0  . carvedilol (COREG) 6.25 MG tablet Take 0.5 tablets (3.125 mg total) by mouth 2 (two) times daily with a meal.  90 tablet  3  . FIBER PO Take by mouth as needed.      . Multiple Vitamins-Minerals (CENTRUM SILVER ULTRA WOMENS PO) Take by mouth daily.      . Omeprazole (PRILOSEC PO) Take by mouth as needed.      . Probiotic Product (ALIGN) 4 MG CAPS Take 1 capsule by mouth daily.       No current facility-administered medications on file prior to visit.     Past Medical History  Diagnosis Date  . Cervical disc disease     c7  . Spinal stenosis in cervical region   . Carpal tunnel syndrome   . Pinched nerve   . Varicose veins of lower extremities with inflammation   . Disc degeneration   . Hypertension     Borderline.  . Carotid stenosis     Mild bilateral  . Anemia   . Anxiety   . GERD  (gastroesophageal reflux disease)   . Cancer      Past Surgical History  Procedure Laterality Date  . Colonoscopy    . Bladder tact  1988/1989    x2  . Shoulder surgery Right 04/06/13    right     Family History  Problem Relation Age of Onset  . Heart disease Mother   . Heart failure Father   . Heart disease Father   . Hypertension Brother   . Heart disease Brother     congenital shunt, ?   . Colon cancer Paternal Aunt 59  . Throat cancer Father   . Skin cancer Father   . Skin cancer Brother   . Diabetes Father   . Kidney disease Father      History   Social History  . Marital Status: Divorced    Spouse Name: N/A    Number of Children: N/A  . Years of Education: N/A   Occupational History  . Not on file.   Social History Main Topics  . Smoking status: Former Smoker -- 0.50 packs/day for 4 years    Quit date: 06/26/1968  . Smokeless tobacco: Never Used  . Alcohol Use: 4.8 oz/week    8 Cans of beer per week     Comment: minimal  .  Drug Use: No  . Sexual Activity: Not on file   Other Topics Concern  . Not on file   Social History Narrative   Chauncey Reading is daughter bich, mchaney will,  full code ( reviewed 48)       PHYSICAL EXAM   BP 161/78  Pulse 71  Ht _0  (1.6 m)  Wt 145 lb 4 oz (65.885 kg)  BMI 25.74 kg/m2 Constitutional: She is oriented to person, place, and time. She appears well-developed and well-nourished. No distress.  HENT: No nasal discharge.  Head: Normocephalic and atraumatic.  Eyes: Pupils are equal and round. Right eye exhibits no discharge. Left eye exhibits no discharge.  Neck: Normal range of motion. Neck supple. No JVD present. No thyromegaly present.  Cardiovascular: Normal rate, regular rhythm, normal heart sounds. Exam reveals no gallop and no friction rub. No murmur heard.  Pulmonary/Chest: Effort normal and breath sounds normal. No stridor. No respiratory distress. She has no wheezes. She has no rales. She  exhibits no tenderness.  Abdominal: Soft. Bowel sounds are normal. She exhibits no distension. There is no tenderness. There is no rebound and no guarding.  Musculoskeletal: Normal range of motion. She exhibits no edema and no tenderness.  Neurological: She is alert and oriented to person, place, and time. Coordination normal.  Skin: Skin is warm and dry. No rash noted. She is not diaphoretic. No erythema. No pallor.  Psychiatric: She has a normal mood and affect. Her behavior is normal. Judgment and thought content normal.    RAF:OADLK  Rhythm  WITHIN NORMAL LIMITS  ASSESSMENT AND PLAN

## 2013-11-06 NOTE — Assessment & Plan Note (Signed)
Blood pressure is mildly elevated but she is under stress. Continue to monitor and consider increasing the dose of carvedilol to 6.25 mg twice daily if this continues to be an issue.

## 2013-11-06 NOTE — Assessment & Plan Note (Signed)
Due to premature beats. Symptoms are well-controlled on carvedilol.

## 2013-11-06 NOTE — Progress Notes (Signed)
Patient came to support center for educational material. She also came to the last Multiple Myeloma Support Group meeting. She had many questions and group was able to support her in this new diagnosis. Chaplain encouraged her to also write down her questions and take them with her to her oncologist appointments. Provided education about support center programs and services.  Epifania Gore, [facility], PhD Chaplain

## 2013-11-06 NOTE — Patient Instructions (Signed)
Continue same medications.   Your physician wants you to follow-up in: 6 months.  You will receive a reminder letter in the mail two months in advance. If you don't receive a letter, please call our office to schedule the follow-up appointment.  

## 2013-11-07 ENCOUNTER — Ambulatory Visit (HOSPITAL_BASED_OUTPATIENT_CLINIC_OR_DEPARTMENT_OTHER): Payer: Medicare HMO | Admitting: Hematology and Oncology

## 2013-11-07 ENCOUNTER — Telehealth: Payer: Self-pay | Admitting: Oncology

## 2013-11-07 ENCOUNTER — Encounter: Payer: Self-pay | Admitting: Hematology and Oncology

## 2013-11-07 ENCOUNTER — Telehealth: Payer: Self-pay | Admitting: *Deleted

## 2013-11-07 ENCOUNTER — Encounter: Payer: Medicare HMO | Admitting: Nutrition

## 2013-11-07 ENCOUNTER — Telehealth: Payer: Self-pay | Admitting: Hematology and Oncology

## 2013-11-07 VITALS — BP 157/61 | HR 82 | Temp 98.1°F | Resp 18 | Ht 63.0 in | Wt 146.4 lb

## 2013-11-07 DIAGNOSIS — C9000 Multiple myeloma not having achieved remission: Secondary | ICD-10-CM

## 2013-11-07 DIAGNOSIS — D649 Anemia, unspecified: Secondary | ICD-10-CM

## 2013-11-07 DIAGNOSIS — C9002 Multiple myeloma in relapse: Secondary | ICD-10-CM | POA: Insufficient documentation

## 2013-11-07 DIAGNOSIS — D696 Thrombocytopenia, unspecified: Secondary | ICD-10-CM

## 2013-11-07 DIAGNOSIS — C9001 Multiple myeloma in remission: Secondary | ICD-10-CM | POA: Insufficient documentation

## 2013-11-07 DIAGNOSIS — F411 Generalized anxiety disorder: Secondary | ICD-10-CM

## 2013-11-07 MED ORDER — ONDANSETRON HCL 8 MG PO TABS: 8.0000 mg | ORAL_TABLET | Freq: Three times a day (TID) | ORAL | Status: DC | PRN

## 2013-11-07 MED ORDER — ACYCLOVIR 400 MG PO TABS: 400.0000 mg | ORAL_TABLET | Freq: Two times a day (BID) | ORAL | Status: DC

## 2013-11-07 MED ORDER — DEXAMETHASONE 4 MG PO TABS: 40.0000 mg | ORAL_TABLET | ORAL | Status: DC

## 2013-11-07 NOTE — Patient Instructions (Signed)
Lenalidomide Oral Capsules What is this medicine? LENALIDOMIDE (len a LID oh mide) is used to treat certain types of cancer, including multiple myeloma and mantle cell lymphoma. It is also used to treat some myelodysplastic syndromes that cause severe anemia requiring blood transfusions. This medicine may be used for other purposes; ask your health care provider or pharmacist if you have questions. COMMON BRAND NAME(S): Revlimid What should I tell my health care provider before I take this medicine? They need to know if you have any of these conditions: -blood clots in the legs or the lungs -infection -irregular monthly periods or menstrual cycles -kidney disease -liver disease -an unusual or allergic reaction to lenalidomide, other medicines, foods, dyes, or preservatives -pregnant or trying to get pregnant -breast-feeding How should I use this medicine? Take this medicine by mouth with a glass of water. Follow the directions on the prescription label. Do not cut, crush, or chew this medicine. Take your medicine at regular intervals. Do not take it more often than directed. Do not stop taking except on your doctor's advice. A MedGuide will be given with each prescription and refill. Read this guide carefully each time. The MedGuide may change frequently. Talk to your pediatrician regarding the use of this medicine in children. Special care may be needed. Overdosage: If you think you have taken too much of this medicine contact a poison control center or emergency room at once. NOTE: This medicine is only for you. Do not share this medicine with others. What if I miss a dose? If you miss a dose, take it as soon as you can. If your next dose is to be taken in less than 12 hours, then do not take the missed dose. Take the next dose at your regular time. Do not take double or extra doses. What may interact with this medicine? -vaccines This list may not describe all possible interactions. Give  your health care provider a list of all the medicines, herbs, non-prescription drugs, or dietary supplements you use. Also tell them if you smoke, drink alcohol, or use illegal drugs. Some items may interact with your medicine. What should I watch for while using this medicine? Visit your doctor for regular check ups. Tell your doctor or healthcare professional if your symptoms do not start to get better or if they get worse. You will need to have important blood work done while you are taking this medicine. This medicine is available only through a special program. Doctors, pharmacies, and patients must meet all of the conditions of the program. Your health care provider will help you get signed up with the program if you need this medicine. Through the program you will only receive up to a 28 day supply of the medicine at one time. You will need a new prescription for each refill. This medicine can cause birth defects. Do not get pregnant while taking this drug. Females with child-bearing potential will need to have 2 negative pregnancy tests before starting this medicine. Pregnancy testing must be done every 2 to 4 weeks as directed while taking this medicine. Use 2 reliable forms of birth control together while you are taking this medicine and for 1 month after you stop taking this medicine. If you think that you might be pregnant talk to your doctor right away. Men must use a latex condom during sexual contact with a woman while taking this medicine and for 28 days after you stop taking this medicine. A latex condom is needed even  if you have had a vasectomy. Contact your doctor right away if your partner becomes pregnant. Do not donate sperm while taking this medicine and for 28 days after you stop taking this medicine. Do not give blood while taking the medicine and for 1 month after completion of treatment to avoid exposing pregnant women to the medicine through the donated blood. Talk to your doctor  about your risk of cancer. You may be more at risk for certain types of cancers if you take this medicine. You may need blood work done while you are taking this medicine. What side effects may I notice from receiving this medicine? Side effects that you should report to your doctor or health care professional as soon as possible: -allergic reactions like skin rash, itching or hives, swelling of the face, lips, or tongue -breathing problems -chest pain -fever, infection, runny nose, or sore throat -pain in the legs -right upper belly pain -signs and symptoms of bleeding such as bloody or black, tarry stools; red or dark-brown urine; spitting up blood or brown material that looks like coffee grounds; red spots on the skin; unusual bruising or bleeding from the eye, gums, or nose -swelling or your hands, ankles, or leg -tiredness -yellowing of the eyes or skin  Side effects that usually do not require medical attention (report to your doctor or health care professional if they continue or are bothersome): -diarrhea -dizziness -back pain This list may not describe all possible side effects. Call your doctor for medical advice about side effects. You may report side effects to FDA at 1-800-FDA-1088. Where should I keep my medicine? Keep out of the reach of children. Store at room temperature between 15 and 30 degrees C (59 and 86 degrees F). Throw away any unused medicine after the expiration date. NOTE: This sheet is a summary. It may not cover all possible information. If you have questions about this medicine, talk to your doctor, pharmacist, or health care provider.  2014, Elsevier/Gold Standard. (2012-10-29 16:37:01) Dexamethasone tablets What is this medicine? DEXAMETHASONE (dex a METH a sone) is a corticosteroid. It is commonly used to treat inflammation of the skin, joints, lungs, and other organs. Common conditions treated include asthma, allergies, and arthritis. It is also used for  other conditions, such as blood disorders and diseases of the adrenal glands. This medicine may be used for other purposes; ask your health care provider or pharmacist if you have questions. COMMON BRAND NAME(S): Decadron, DexPak Sterling Big, DexPak TaperPak, Zema-Pak What should I tell my health care provider before I take this medicine? They need to know if you have any of these conditions: -Cushing's syndrome -diabetes -glaucoma -heart problems or disease -high blood pressure -infection like herpes, measles, tuberculosis, or chickenpox -kidney disease -liver disease -mental problems -myasthenia gravis -osteoporosis -previous heart attack -seizures -stomach, ulcer or intestine disease including colitis and diverticulitis -thyroid problem -an unusual or allergic reaction to dexamethasone, corticosteroids, other medicines, lactose, foods, dyes, or preservatives -pregnant or trying to get pregnant -breast-feeding How should I use this medicine? Take this medicine by mouth with a drink of water. Follow the directions on the prescription label. Take it with food or milk to avoid stomach upset. If you are taking this medicine once a day, take it in the morning. Do not take more medicine than you are told to take. Do not suddenly stop taking your medicine because you may develop a severe reaction. Your doctor will tell you how much medicine to take. If  your doctor wants you to stop the medicine, the dose may be slowly lowered over time to avoid any side effects. Talk to your pediatrician regarding the use of this medicine in children. Special care may be needed. Patients over 49 years old may have a stronger reaction and need a smaller dose. Overdosage: If you think you have taken too much of this medicine contact a poison control center or emergency room at once. NOTE: This medicine is only for you. Do not share this medicine with others. What if I miss a dose? If you miss a dose, take it  as soon as you can. If it is almost time for your next dose, talk to your doctor or health care professional. You may need to miss a dose or take an extra dose. Do not take double or extra doses without advice. What may interact with this medicine? Do not take this medicine with any of the following medications: -mifepristone, RU-486 -vaccines This medicine may also interact with the following medications: -amphotericin B -antibiotics like clarithromycin, erythromycin, and troleandomycin -aspirin and aspirin-like drugs -barbiturates like phenobarbital -carbamazepine -cholestyramine -cholinesterase inhibitors like donepezil, galantamine, rivastigmine, and tacrine -cyclosporine -digoxin -diuretics -ephedrine -female hormones, like estrogens or progestins and birth control pills -indinavir -isoniazid -ketoconazole -medicines for diabetes -medicines that improve muscle tone or strength for conditions like myasthenia gravis -NSAIDs, medicines for pain and inflammation, like ibuprofen or naproxen -phenytoin -rifampin -thalidomide -warfarin This list may not describe all possible interactions. Give your health care provider a list of all the medicines, herbs, non-prescription drugs, or dietary supplements you use. Also tell them if you smoke, drink alcohol, or use illegal drugs. Some items may interact with your medicine. What should I watch for while using this medicine? Visit your doctor or health care professional for regular checks on your progress. If you are taking this medicine over a prolonged period, carry an identification card with your name and address, the type and dose of your medicine, and your doctor's name and address. This medicine may increase your risk of getting an infection. Stay away from people who are sick. Tell your doctor or health care professional if you are around anyone with measles or chickenpox. If you are going to have surgery, tell your doctor or health  care professional that you have taken this medicine within the last twelve months. Ask your doctor or health care professional about your diet. You may need to lower the amount of salt you eat. The medicine can increase your blood sugar. If you are a diabetic check with your doctor if you need help adjusting the dose of your diabetic medicine. What side effects may I notice from receiving this medicine? Side effects that you should report to your doctor or health care professional as soon as possible: -allergic reactions like skin rash, itching or hives, swelling of the face, lips, or tongue -changes in vision -fever, sore throat, sneezing, cough, or other signs of infection, wounds that will not heal -increased thirst -mental depression, mood swings, mistaken feelings of self importance or of being mistreated -pain in hips, back, ribs, arms, shoulders, or legs -redness, blistering, peeling or loosening of the skin, including inside the mouth -trouble passing urine or change in the amount of urine -swelling of feet or lower legs -unusual bleeding or bruising Side effects that usually do not require medical attention (report to your doctor or health care professional if they continue or are bothersome): -headache -nausea, vomiting -skin problems, acne, thin and  shiny skin -weight gain This list may not describe all possible side effects. Call your doctor for medical advice about side effects. You may report side effects to FDA at 1-800-FDA-1088. Where should I keep my medicine? Keep out of the reach of children. Store at room temperature between 20 and 25 degrees C (68 and 77 degrees F). Protect from light. Throw away any unused medicine after the expiration date. NOTE: This sheet is a summary. It may not cover all possible information. If you have questions about this medicine, talk to your doctor, pharmacist, or health care provider.  2014, Elsevier/Gold Standard. (2007-10-03  14:02:13) Bortezomib injection What is this medicine? BORTEZOMIB (bor TEZ oh mib) is a chemotherapy drug. It slows the growth of cancer cells. This medicine is used to treat multiple myeloma, lymphoma, and other cancers. This medicine may be used for other purposes; ask your health care provider or pharmacist if you have questions. COMMON BRAND NAME(S): Velcade What should I tell my health care provider before I take this medicine? They need to know if you have any of these conditions: -heart disease -irregular heartbeat -liver disease -low blood counts, like low white blood cells, platelets, or hemoglobin -peripheral neuropathy -taking medicine for blood pressure -an unusual or allergic reaction to bortezomib, mannitol, boron, other medicines, foods, dyes, or preservatives -pregnant or trying to get pregnant -breast-feeding How should I use this medicine? This medicine is for injection into a vein or for injection under the skin. It is given by a health care professional in a hospital or clinic setting. Talk to your pediatrician regarding the use of this medicine in children. Special care may be needed. Overdosage: If you think you have taken too much of this medicine contact a poison control center or emergency room at once. NOTE: This medicine is only for you. Do not share this medicine with others. What if I miss a dose? It is important not to miss your dose. Call your doctor or health care professional if you are unable to keep an appointment. What may interact with this medicine? -medicines for diabetes -medicines to increase blood counts like filgrastim, pegfilgrastim, sargramostim -zalcitabine Talk to your doctor or health care professional before taking any of these medicines: -acetaminophen -aspirin -ibuprofen -ketoprofen -naproxen This list may not describe all possible interactions. Give your health care provider a list of all the medicines, herbs, non-prescription  drugs, or dietary supplements you use. Also tell them if you smoke, drink alcohol, or use illegal drugs. Some items may interact with your medicine. What should I watch for while using this medicine? Visit your doctor for checks on your progress. This drug may make you feel generally unwell. This is not uncommon, as chemotherapy can affect healthy cells as well as cancer cells. Report any side effects. Continue your course of treatment even though you feel ill unless your doctor tells you to stop. You may get drowsy or dizzy. Do not drive, use machinery, or do anything that needs mental alertness until you know how this medicine affects you. Do not stand or sit up quickly, especially if you are an older patient. This reduces the risk of dizzy or fainting spells. In some cases, you may be given additional medicines to help with side effects. Follow all directions for their use. Call your doctor or health care professional for advice if you get a fever, chills or sore throat, or other symptoms of a cold or flu. Do not treat yourself. This drug decreases your body's  ability to fight infections. Try to avoid being around people who are sick. This medicine may increase your risk to bruise or bleed. Call your doctor or health care professional if you notice any unusual bleeding. Be careful brushing and flossing your teeth or using a toothpick because you may get an infection or bleed more easily. If you have any dental work done, tell your dentist you are receiving this medicine. Avoid taking products that contain aspirin, acetaminophen, ibuprofen, naproxen, or ketoprofen unless instructed by your doctor. These medicines may hide a fever. Do not become pregnant while taking this medicine. Women should inform their doctor if they wish to become pregnant or think they might be pregnant. There is a potential for serious side effects to an unborn child. Talk to your health care professional or pharmacist for more  information. Do not breast-feed an infant while taking this medicine. You may have vomiting or diarrhea while taking this medicine. Drink water or other fluids as directed. What side effects may I notice from receiving this medicine? Side effects that you should report to your doctor or health care professional as soon as possible: -allergic reactions like skin rash, itching or hives, swelling of the face, lips, or tongue -breathing problems -changes in hearing -changes in vision -fast, irregular heartbeat -feeling faint or lightheaded, falls -pain, tingling, numbness in the hands or feet -seizures -swelling of the ankles, feet, hands -unusual bleeding or bruising -unusually weak or tired -vomiting Side effects that usually do not require medical attention (report to your doctor or health care professional if they continue or are bothersome): -changes in emotions or moods -constipation -diarrhea -loss of appetite -headache -irritation at site where injected -nausea This list may not describe all possible side effects. Call your doctor for medical advice about side effects. You may report side effects to FDA at 1-800-FDA-1088. Where should I keep my medicine? This drug is given in a hospital or clinic and will not be stored at home. NOTE: This sheet is a summary. It may not cover all possible information. If you have questions about this medicine, talk to your doctor, pharmacist, or health care provider.  2014, Elsevier/Gold Standard. (2010-07-20 11:42:36)

## 2013-11-07 NOTE — Telephone Encounter (Signed)
Talked to pt and she is aware of all May appts

## 2013-11-07 NOTE — Telephone Encounter (Signed)
Per staff message and POF I have scheduled appts.  JMW  

## 2013-11-07 NOTE — Progress Notes (Signed)
[facility] OFFICE PROGRESS NOTE  Patient Care Team: Jinny Sanders, MD as PCP - General (Family Medicine)  DIAGNOSIS: IgA lambda multiple myeloma with anemia and thrombocytopenia  SUMMARY OF ONCOLOGIC HISTORY: Jean Davidson 68 y.o. female is here because of abnormal blood work as above. She was seen by her dermatologist for abnormal changes in the legs, consistent with livedo reticularis. On review of the blood work, the patient has significant elevated total protein with anemia and thrombocytopenia. Serum protein electrophoresis showed 2 restricted bands consistent with monoclonal proteins with an estimated M spike of 3.54 g. On 10/31/2013, bone marrow aspirate and biopsy confirmed myeloma with 40% bone marrow involvement. Skeletal survey showed only minimal lesions in her skull with generalized demineralization.  INTERVAL HISTORY: Jean Davidson 68 y.o. female returns for further followup. She still feel anxious but is sleeping better. She is not using the prescribed anxiolytic. She still has some soreness at the site of the bone marrow biopsy.  I have reviewed the past medical history, past surgical history, social history and family history with the patient and they are unchanged from previous note.  ALLERGIES:  is allergic to penicillins and sulfa antibiotics.  MEDICATIONS:  Current Outpatient Prescriptions  Medication Sig Dispense Refill  . carvedilol (COREG) 6.25 MG tablet Take 0.5 tablets (3.125 mg total) by mouth 2 (two) times daily with a meal.  90 tablet  3  . FIBER PO Take by mouth as needed.      . Multiple Vitamins-Minerals (CENTRUM SILVER ULTRA WOMENS PO) Take by mouth daily.      . Omeprazole (PRILOSEC PO) Take by mouth as needed.      . Probiotic Product (ALIGN) 4 MG CAPS Take 1 capsule by mouth daily.      Marland Kitchen acyclovir (ZOVIRAX) 400 MG tablet Take 1 tablet (400 mg total) by mouth 2 (two) times daily.  60 tablet  3  . albuterol (PROVENTIL HFA;VENTOLIN HFA)  108 (90 BASE) MCG/ACT inhaler Inhale 2 puffs into the lungs every 4 (four) hours as needed for wheezing or shortness of breath (or coughing fit).  1 Inhaler  0  . ALPRAZolam (XANAX) 0.5 MG tablet Take 1 tablet (0.5 mg total) by mouth 2 (two) times daily as needed for anxiety.  30 tablet  0  . mirtazapine (REMERON) 15 MG tablet Take 15 mg by mouth as needed.      . ondansetron (ZOFRAN) 8 MG tablet Take 1 tablet (8 mg total) by mouth every 8 (eight) hours as needed for nausea.  30 tablet  3   No current facility-administered medications for this visit.    REVIEW OF SYSTEMS:   All other systems were reviewed with the patient and are negative.  PHYSICAL EXAMINATION: ECOG PERFORMANCE STATUS: 1 - Symptomatic but completely ambulatory  Filed Vitals:   11/07/13 1144  BP: 157/61  Pulse: 82  Temp: 98.1 F (36.7 C)  Resp: 18   Filed Weights   11/07/13 1144  Weight: 146 lb 6.4 oz (66.407 kg)    GENERAL:alert, no distress and comfortable. She looks and shows SKIN: skin color, texture, turgor are normal, no rashes or significant lesions EYES: normal, Conjunctiva are pink and non-injected, sclera clear Musculoskeletal:no cyanosis of digits and no clubbing  NEURO: alert & oriented x 3 with fluent speech, no focal motor/sensory deficits  LABORATORY DATA:  I have reviewed the data as listed    Component Value Date/Time   NA 139 10/31/2013 0949   NA 138 10/28/2013  0955   K 3.8 10/31/2013 0949   K 3.9 10/28/2013 0955   CL 99 10/28/2013 0955   CO2 24 10/31/2013 0949   CO2 28 10/28/2013 0955   GLUCOSE 132 10/31/2013 0949   GLUCOSE 100* 10/28/2013 0955   BUN 9.8 10/31/2013 0949   BUN 11 10/28/2013 0955   CREATININE 0.8 10/31/2013 0949   CREATININE 0.8 10/28/2013 0955   CALCIUM 9.2 10/31/2013 0949   CALCIUM 9.3 10/28/2013 0955   PROT 8.9* 10/31/2013 0949   PROT 9.5* 08/08/2013 1127   ALBUMIN 3.6 10/31/2013 0949   ALBUMIN 3.7 08/08/2013 1127   AST 18 10/31/2013 0949   AST 19 08/08/2013 1127   ALT 9 10/31/2013 0949   ALT 19  08/08/2013 1127   ALKPHOS 42 10/31/2013 0949   ALKPHOS 41 08/08/2013 1127   BILITOT 0.45 10/31/2013 0949   BILITOT 0.6 08/08/2013 1127    No results found for this basename: SPEP,  UPEP,   kappa and lambda light chains    Lab Results  Component Value Date   WBC 6.8 10/31/2013   NEUTROABS 3.0 10/31/2013   HGB 9.7* 10/31/2013   HCT 29.2* 10/31/2013   MCV 99.7 10/31/2013   PLT 115* 10/31/2013      Chemistry      Component Value Date/Time   NA 139 10/31/2013 0949   NA 138 10/28/2013 0955   K 3.8 10/31/2013 0949   K 3.9 10/28/2013 0955   CL 99 10/28/2013 0955   CO2 24 10/31/2013 0949   CO2 28 10/28/2013 0955   BUN 9.8 10/31/2013 0949   BUN 11 10/28/2013 0955   CREATININE 0.8 10/31/2013 0949   CREATININE 0.8 10/28/2013 0955      Component Value Date/Time   CALCIUM 9.2 10/31/2013 0949   CALCIUM 9.3 10/28/2013 0955   ALKPHOS 42 10/31/2013 0949   ALKPHOS 41 08/08/2013 1127   AST 18 10/31/2013 0949   AST 19 08/08/2013 1127   ALT 9 10/31/2013 0949   ALT 19 08/08/2013 1127   BILITOT 0.45 10/31/2013 0949   BILITOT 0.6 08/08/2013 1127     RADIOGRAPHIC STUDIES: I reviewed her skeletal survey with her I have personally reviewed the radiological images as listed and agreed with the findings in the report.  ASSESSMENT & PLAN:  #1 IgA lambda multiple myeloma Have a long discussion with the patient and her friend. We discussed about the approach of treatment for multiple myeloma. We discussed the role of chemotherapy. The intent is for palliative.  We discussed some of the risks, benefits, side-effects of Bortezemib and Dexamethasone.   Some of the short term side-effects included, though not limited to, risk of fatigue, risk of allergic reactions, mouth sores, weight loss, pancytopenia, life-threatening infections, need for transfusions of blood products, nausea, vomiting, change in bowel habits, blood clots, admission to hospital for various reasons, and risks of death.   Long term side-effects are also discussed including risks  of infertility, permanent damage to nerve function, chronic fatigue, and rare secondary malignancy including bone marrow disorders and leukemia.   The patient is aware that the response rates discussed earlier is not guaranteed.  After a long discussion, patient made an informed decision to proceed with the prescribed plan of care.   Patient education materials were dispensed today At the end of the day, she is interested on Velcade and dexamethasone only. I did give her information about Revlimid in case she wants to be more aggressive for treatment. She would be undergoing dental  evaluation next week. If she has no dental problem, I will add on treatment with IV bisphosphonates. She will need a bone marrow transplant evaluation but I would discuss with her further in the future. #2 anxiety She will continue on anxiolytic as prescribed #3 mild anemia and thrombocytopenia She is not symptomatic. I will observe. #4 antimicrobial prophylaxis We will start her on acyclovir.  All questions were answered. The patient knows to call the clinic with any problems, questions or concerns. No barriers to learning was detected. I spent 40 minutes counseling the patient face to face. The total time spent in the appointment was 55 minutes and more than 50% was on counseling and review of test results     [name], MD 11/07/2013 2:46 PM

## 2013-11-07 NOTE — Telephone Encounter (Signed)
Informed pt of new order for Dexamethasone sent to [facility].  Instructed to take 10 tablets to equal 40 mg once a week on Monday mornings. Always take w/ food.  Start this Monday.  Pt verbalized understanding and will pick up Dex from pharmacy this weekend.

## 2013-11-07 NOTE — Telephone Encounter (Signed)
gave pt appt for lab and Md for may, left Sharyn Lull a VM for appt scheduled for Monday 2015

## 2013-11-07 NOTE — Telephone Encounter (Signed)
Advised scheduler to move labs 

## 2013-11-10 ENCOUNTER — Ambulatory Visit: Payer: Medicare HMO

## 2013-11-10 ENCOUNTER — Telehealth: Payer: Self-pay | Admitting: *Deleted

## 2013-11-10 VITALS — BP 126/58 | HR 90 | Temp 97.6°F | Resp 18

## 2013-11-10 DIAGNOSIS — C9000 Multiple myeloma not having achieved remission: Secondary | ICD-10-CM

## 2013-11-10 DIAGNOSIS — Z5112 Encounter for antineoplastic immunotherapy: Secondary | ICD-10-CM

## 2013-11-10 LAB — TISSUE HYBRIDIZATION (BONE MARROW)-NCBH

## 2013-11-10 LAB — CHROMOSOME ANALYSIS, BONE MARROW

## 2013-11-10 MED ORDER — ONDANSETRON HCL 8 MG PO TABS
ORAL_TABLET | ORAL | Status: AC
  Filled 2013-11-10: qty 1

## 2013-11-10 MED ORDER — BORTEZOMIB CHEMO SQ INJECTION 3.5 MG (2.5MG/ML)
1.3000 mg/m2 | Freq: Once | INTRAMUSCULAR | Status: AC
  Administered 2013-11-10: 2.25 mg via SUBCUTANEOUS
  Filled 2013-11-10: qty 2.25

## 2013-11-10 MED ORDER — ONDANSETRON HCL 8 MG PO TABS
8.0000 mg | ORAL_TABLET | Freq: Once | ORAL | Status: AC
  Administered 2013-11-10: 8 mg via ORAL

## 2013-11-10 MED ORDER — ONDANSETRON HCL 8 MG PO TABS: 8.0000 mg | ORAL_TABLET | Freq: Two times a day (BID) | ORAL | Status: DC

## 2013-11-10 NOTE — Telephone Encounter (Signed)
Pt called to ask when to take her acyclovir and if she needs to take her nausea medication prior to coming in for chemo?  Called pt back and she was at dentist getting a filling today.  Instructed to take acyclovir every day as directed until directed otherwise.  Instructed she does not need to take any nausea medication prior to chemo as she will be given anti emetic pre med w/ her chemo.  She verbalized understanding.

## 2013-11-10 NOTE — Patient Instructions (Addendum)
[facility] Discharge Instructions for Patients Receiving Chemotherapy  Today you received the following chemotherapy agents: Velcade  To help prevent nausea and vomiting after your treatment, we encourage you to take your nausea medication: Zofran 8 mg every 8 hrs as needed for nausea.    If you develop nausea and vomiting that is not controlled by your nausea medication, call the clinic.   BELOW ARE SYMPTOMS THAT SHOULD BE REPORTED IMMEDIATELY:  *FEVER GREATER THAN 100.5 F  *CHILLS WITH OR WITHOUT FEVER  NAUSEA AND VOMITING THAT IS NOT CONTROLLED WITH YOUR NAUSEA MEDICATION  *UNUSUAL SHORTNESS OF BREATH  *UNUSUAL BRUISING OR BLEEDING  TENDERNESS IN MOUTH AND THROAT WITH OR WITHOUT PRESENCE OF ULCERS  *URINARY PROBLEMS  *BOWEL PROBLEMS  UNUSUAL RASH Items with * indicate a potential emergency and should be followed up as soon as possible.  Feel free to call the clinic you have any questions or concerns. The clinic phone number is [phone].   Bortezomib injection (Velcade) What is this medicine? BORTEZOMIB (bor TEZ oh mib) is a chemotherapy drug. It slows the growth of cancer cells. This medicine is used to treat multiple myeloma, lymphoma, and other cancers. This medicine may be used for other purposes; ask your health care provider or pharmacist if you have questions. COMMON BRAND NAME(S): Velcade What should I tell my health care provider before I take this medicine? They need to know if you have any of these conditions: -heart disease -irregular heartbeat -liver disease -low blood counts, like low white blood cells, platelets, or hemoglobin -peripheral neuropathy -taking medicine for blood pressure -an unusual or allergic reaction to bortezomib, mannitol, boron, other medicines, foods, dyes, or preservatives -pregnant or trying to get pregnant -breast-feeding How should I use this medicine? This medicine is for injection into a vein or for  injection under the skin. It is given by a health care professional in a hospital or clinic setting. Talk to your pediatrician regarding the use of this medicine in children. Special care may be needed. Overdosage: If you think you have taken too much of this medicine contact a poison control center or emergency room at once. NOTE: This medicine is only for you. Do not share this medicine with others. What if I miss a dose? It is important not to miss your dose. Call your doctor or health care professional if you are unable to keep an appointment. What may interact with this medicine? -medicines for diabetes -medicines to increase blood counts like filgrastim, pegfilgrastim, sargramostim -zalcitabine Talk to your doctor or health care professional before taking any of these medicines: -acetaminophen -aspirin -ibuprofen -ketoprofen -naproxen This list may not describe all possible interactions. Give your health care provider a list of all the medicines, herbs, non-prescription drugs, or dietary supplements you use. Also tell them if you smoke, drink alcohol, or use illegal drugs. Some items may interact with your medicine. What should I watch for while using this medicine? Visit your doctor for checks on your progress. This drug may make you feel generally unwell. This is not uncommon, as chemotherapy can affect healthy cells as well as cancer cells. Report any side effects. Continue your course of treatment even though you feel ill unless your doctor tells you to stop. You may get drowsy or dizzy. Do not drive, use machinery, or do anything that needs mental alertness until you know how this medicine affects you. Do not stand or sit up quickly, especially if you are an older patient.  This reduces the risk of dizzy or fainting spells. In some cases, you may be given additional medicines to help with side effects. Follow all directions for their use. Call your doctor or health care professional  for advice if you get a fever, chills or sore throat, or other symptoms of a cold or flu. Do not treat yourself. This drug decreases your body's ability to fight infections. Try to avoid being around people who are sick. This medicine may increase your risk to bruise or bleed. Call your doctor or health care professional if you notice any unusual bleeding. Be careful brushing and flossing your teeth or using a toothpick because you may get an infection or bleed more easily. If you have any dental work done, tell your dentist you are receiving this medicine. Avoid taking products that contain aspirin, acetaminophen, ibuprofen, naproxen, or ketoprofen unless instructed by your doctor. These medicines may hide a fever. Do not become pregnant while taking this medicine. Women should inform their doctor if they wish to become pregnant or think they might be pregnant. There is a potential for serious side effects to an unborn child. Talk to your health care professional or pharmacist for more information. Do not breast-feed an infant while taking this medicine. You may have vomiting or diarrhea while taking this medicine. Drink water or other fluids as directed. What side effects may I notice from receiving this medicine? Side effects that you should report to your doctor or health care professional as soon as possible: -allergic reactions like skin rash, itching or hives, swelling of the face, lips, or tongue -breathing problems -changes in hearing -changes in vision -fast, irregular heartbeat -feeling faint or lightheaded, falls -pain, tingling, numbness in the hands or feet -seizures -swelling of the ankles, feet, hands -unusual bleeding or bruising -unusually weak or tired -vomiting Side effects that usually do not require medical attention (report to your doctor or health care professional if they continue or are bothersome): -changes in emotions or moods -constipation -diarrhea -loss of  appetite -headache -irritation at site where injected -nausea This list may not describe all possible side effects. Call your doctor for medical advice about side effects. You may report side effects to FDA at 1-800-FDA-1088. Where should I keep my medicine? This drug is given in a hospital or clinic and will not be stored at home. NOTE: This sheet is a summary. It may not cover all possible information. If you have questions about this medicine, talk to your doctor, pharmacist, or health care provider.  2014, [employer]vier/Gold Standard. (2010-07-20 11:42:36)

## 2013-11-10 NOTE — Progress Notes (Signed)
Per Dr. Alvy Bimler, okay to use 10/31/13 labs. Patient tolerated Velcade without difficulty.

## 2013-11-11 ENCOUNTER — Telehealth: Payer: Self-pay | Admitting: *Deleted

## 2013-11-11 NOTE — Telephone Encounter (Signed)
Pt reports area of "slight redness" around her Velcade injection site on abd.  Pt had first Velcade yesterday.  Informed her redness can be normal irritation from injection.  Please notify us if it becomes warm or more red.  She verbalized understanding. Pt denies any fevers, n/v/d.  Reports some insomnia last night which she understands is due to the dexamethasone.  Instructed pt to call us for any new concerns, fevers, uncontrolled n/v/d prior to next visit.  She verbalized understanding.

## 2013-11-11 NOTE — Telephone Encounter (Signed)
Per patient request I have moved her appt for 5/21 to earlier

## 2013-11-11 NOTE — Telephone Encounter (Signed)
yes

## 2013-11-11 NOTE — Telephone Encounter (Signed)
Pt asks if ok to get her hair cut and colored?

## 2013-11-11 NOTE — Telephone Encounter (Signed)
Informed pt ok to have her hair cut and colored.

## 2013-11-13 ENCOUNTER — Telehealth: Payer: Self-pay | Admitting: *Deleted

## 2013-11-13 ENCOUNTER — Ambulatory Visit (HOSPITAL_BASED_OUTPATIENT_CLINIC_OR_DEPARTMENT_OTHER): Payer: Medicare HMO

## 2013-11-13 VITALS — BP 127/57 | HR 69 | Temp 98.6°F

## 2013-11-13 DIAGNOSIS — C9000 Multiple myeloma not having achieved remission: Secondary | ICD-10-CM

## 2013-11-13 DIAGNOSIS — Z5112 Encounter for antineoplastic immunotherapy: Secondary | ICD-10-CM

## 2013-11-13 MED ORDER — BORTEZOMIB CHEMO SQ INJECTION 3.5 MG (2.5MG/ML)
1.3000 mg/m2 | Freq: Once | INTRAMUSCULAR | Status: AC
  Administered 2013-11-13: 2.25 mg via SUBCUTANEOUS
  Filled 2013-11-13: qty 2.25

## 2013-11-13 MED ORDER — ONDANSETRON HCL 8 MG PO TABS: 8.0000 mg | ORAL_TABLET | Freq: Once | ORAL | Status: DC

## 2013-11-13 MED ORDER — ONDANSETRON HCL 8 MG PO TABS
ORAL_TABLET | ORAL | Status: AC
  Filled 2013-11-13: qty 1

## 2013-11-13 NOTE — Telephone Encounter (Signed)
yes

## 2013-11-13 NOTE — Progress Notes (Signed)
Ok to treat per infusion note from 5/18, using labs from 5/8 per Dr Alvy Bimler.

## 2013-11-13 NOTE — Telephone Encounter (Signed)
Pt reports some diarrhea started this morning prior to treatment,  Then 2 more episodes after her treatment.  She asks if ok to take imodium?  Instructed pt ok to take imodium as directed on package. Drink plenty of fluids and notify us if the imodium does not work.  She verbalized understanding.

## 2013-11-13 NOTE — Patient Instructions (Signed)
[facility] Discharge Instructions for Patients Receiving Chemotherapy  Today you received the following chemotherapy agents velcade   To help prevent nausea and vomiting after your treatment, we encourage you to take your nausea medication as directed  If you develop nausea and vomiting that is not controlled by your nausea medication, call the clinic.   BELOW ARE SYMPTOMS THAT SHOULD BE REPORTED IMMEDIATELY:  *FEVER GREATER THAN 100.5 F  *CHILLS WITH OR WITHOUT FEVER  NAUSEA AND VOMITING THAT IS NOT CONTROLLED WITH YOUR NAUSEA MEDICATION  *UNUSUAL SHORTNESS OF BREATH  *UNUSUAL BRUISING OR BLEEDING  TENDERNESS IN MOUTH AND THROAT WITH OR WITHOUT PRESENCE OF ULCERS  *URINARY PROBLEMS  *BOWEL PROBLEMS  UNUSUAL RASH Items with * indicate a potential emergency and should be followed up as soon as possible.  Feel free to call the clinic you have any questions or concerns. The clinic phone number is [phone].  

## 2013-11-14 ENCOUNTER — Encounter: Payer: Self-pay | Admitting: Physician Assistant

## 2013-11-14 ENCOUNTER — Other Ambulatory Visit: Payer: Self-pay | Admitting: Hematology and Oncology

## 2013-11-14 DIAGNOSIS — C9000 Multiple myeloma not having achieved remission: Secondary | ICD-10-CM

## 2013-11-18 ENCOUNTER — Other Ambulatory Visit (HOSPITAL_BASED_OUTPATIENT_CLINIC_OR_DEPARTMENT_OTHER): Payer: Medicare HMO

## 2013-11-18 ENCOUNTER — Ambulatory Visit: Payer: Medicare HMO | Admitting: Nutrition

## 2013-11-18 ENCOUNTER — Ambulatory Visit (HOSPITAL_BASED_OUTPATIENT_CLINIC_OR_DEPARTMENT_OTHER): Payer: Medicare HMO

## 2013-11-18 ENCOUNTER — Other Ambulatory Visit: Payer: Self-pay | Admitting: Hematology and Oncology

## 2013-11-18 VITALS — BP 123/72 | HR 69 | Temp 98.6°F | Resp 18

## 2013-11-18 DIAGNOSIS — Z5112 Encounter for antineoplastic immunotherapy: Secondary | ICD-10-CM

## 2013-11-18 DIAGNOSIS — C9000 Multiple myeloma not having achieved remission: Secondary | ICD-10-CM

## 2013-11-18 LAB — COMPREHENSIVE METABOLIC PANEL (CC13)
ALT: 12 U/L (ref 0–55)
AST: 15 U/L (ref 5–34)
Albumin: 3.5 g/dL (ref 3.5–5.0)
Alkaline Phosphatase: 46 U/L (ref 40–150)
Anion Gap: 12 mEq/L — ABNORMAL HIGH (ref 3–11)
BUN: 17.4 mg/dL (ref 7.0–26.0)
CO2: 23 mEq/L (ref 22–29)
Calcium: 9.1 mg/dL (ref 8.4–10.4)
Chloride: 105 mEq/L (ref 98–109)
Creatinine: 0.8 mg/dL (ref 0.6–1.1)
Glucose: 111 mg/dl (ref 70–140)
Potassium: 3.6 mEq/L (ref 3.5–5.1)
Sodium: 140 mEq/L (ref 136–145)
Total Bilirubin: 0.24 mg/dL (ref 0.20–1.20)
Total Protein: 8.6 g/dL — ABNORMAL HIGH (ref 6.4–8.3)

## 2013-11-18 LAB — CBC WITH DIFFERENTIAL/PLATELET
BASO%: 0.3 % (ref 0.0–2.0)
Basophils Absolute: 0.1 10*3/uL (ref 0.0–0.1)
EOS%: 0 % (ref 0.0–7.0)
Eosinophils Absolute: 0 10*3/uL (ref 0.0–0.5)
HCT: 29.3 % — ABNORMAL LOW (ref 34.8–46.6)
HGB: 9.7 g/dL — ABNORMAL LOW (ref 11.6–15.9)
LYMPH%: 9.6 % — ABNORMAL LOW (ref 14.0–49.7)
MCH: 32.8 pg (ref 25.1–34.0)
MCHC: 33 g/dL (ref 31.5–36.0)
MCV: 99.4 fL (ref 79.5–101.0)
MONO#: 3.8 10*3/uL — ABNORMAL HIGH (ref 0.1–0.9)
MONO%: 20.4 % — ABNORMAL HIGH (ref 0.0–14.0)
NEUT#: 12.8 10*3/uL — ABNORMAL HIGH (ref 1.5–6.5)
NEUT%: 69.7 % (ref 38.4–76.8)
Platelets: 112 10*3/uL — ABNORMAL LOW (ref 145–400)
RBC: 2.94 10*6/uL — ABNORMAL LOW (ref 3.70–5.45)
RDW: 16.4 % — ABNORMAL HIGH (ref 11.2–14.5)
WBC: 18.4 10*3/uL — ABNORMAL HIGH (ref 3.9–10.3)
lymph#: 1.8 10*3/uL (ref 0.9–3.3)

## 2013-11-18 LAB — TECHNOLOGIST REVIEW: Technologist Review: 1

## 2013-11-18 MED ORDER — ONDANSETRON HCL 8 MG PO TABS
ORAL_TABLET | ORAL | Status: AC
  Filled 2013-11-18: qty 1

## 2013-11-18 MED ORDER — BORTEZOMIB CHEMO SQ INJECTION 3.5 MG (2.5MG/ML)
1.3000 mg/m2 | Freq: Once | INTRAMUSCULAR | Status: AC
  Administered 2013-11-18: 2.25 mg via SUBCUTANEOUS
  Filled 2013-11-18: qty 2.25

## 2013-11-18 MED ORDER — ONDANSETRON HCL 8 MG PO TABS: 8.0000 mg | ORAL_TABLET | Freq: Once | ORAL | Status: DC

## 2013-11-18 NOTE — Progress Notes (Signed)
Nutrition followup completed with patient.  Weight is stable and was documented as 146.4 pounds May 15 from 146.7 pounds May 8.  Patient continues to have early satiety.  She is eating small amounts of food often.  Patient did not tolerate regular ensure or boost.  She has diarrhea after drinking.  Patient states juice-based supplements better tolerated.  However, she did not care much for the taste.  Nutrition diagnosis: Unintended weight loss improved.  Intervention: Patient educated to continue small frequent meals and snacks with, high-protein foods. Patient educated to avoid foods/beverages causing diarrhea. Stressed the importance of weight maintenance. Questions were answered and teach back method used.  Monitoring, evaluation, goals: Patient will tolerate adequate calories and protein for weight maintenance.  Next visit: Patient to contact me for further questions or concerns.

## 2013-11-18 NOTE — Patient Instructions (Signed)
[facility] Discharge Instructions for Patients Receiving Chemotherapy  Today you received the following chemotherapy agents Velcade.  To help prevent nausea and vomiting after your treatment, we encourage you to take your nausea medication as directed.    If you develop nausea and vomiting that is not controlled by your nausea medication, call the clinic.   BELOW ARE SYMPTOMS THAT SHOULD BE REPORTED IMMEDIATELY:  *FEVER GREATER THAN 100.5 F  *CHILLS WITH OR WITHOUT FEVER  NAUSEA AND VOMITING THAT IS NOT CONTROLLED WITH YOUR NAUSEA MEDICATION  *UNUSUAL SHORTNESS OF BREATH  *UNUSUAL BRUISING OR BLEEDING  TENDERNESS IN MOUTH AND THROAT WITH OR WITHOUT PRESENCE OF ULCERS  *URINARY PROBLEMS  *BOWEL PROBLEMS  UNUSUAL RASH Items with * indicate a potential emergency and should be followed up as soon as possible.  Feel free to call the clinic you have any questions or concerns. The clinic phone number is [phone].    

## 2013-11-20 ENCOUNTER — Telehealth: Payer: Self-pay | Admitting: *Deleted

## 2013-11-20 ENCOUNTER — Ambulatory Visit: Payer: Medicare HMO

## 2013-11-20 NOTE — Telephone Encounter (Signed)
Pt left VM states she has felt constipated the past few days.  She has increased her fluid and fiber intake. Asks what other interventions are suggested?  Called pt back and she reported she now just had a very loose stool.  Instructed pt to take imodium as directed if she has more than 2 loose stools.  Otherwise try stool softener if she feels constipated again.  Avoid laxatives as she tends to alternate between constipation and diarrhea.  Discuss w/ Dr. Alvy Bimler on her appt tomorrow.  Pt verbalized understanding.

## 2013-11-21 ENCOUNTER — Ambulatory Visit (HOSPITAL_BASED_OUTPATIENT_CLINIC_OR_DEPARTMENT_OTHER): Payer: Medicare HMO

## 2013-11-21 ENCOUNTER — Telehealth: Payer: Self-pay | Admitting: Hematology and Oncology

## 2013-11-21 ENCOUNTER — Ambulatory Visit (HOSPITAL_BASED_OUTPATIENT_CLINIC_OR_DEPARTMENT_OTHER): Payer: Medicare HMO | Admitting: Hematology and Oncology

## 2013-11-21 VITALS — BP 145/66 | HR 75 | Temp 98.1°F | Resp 18 | Ht 63.0 in | Wt 145.0 lb

## 2013-11-21 DIAGNOSIS — D696 Thrombocytopenia, unspecified: Secondary | ICD-10-CM

## 2013-11-21 DIAGNOSIS — D638 Anemia in other chronic diseases classified elsewhere: Secondary | ICD-10-CM

## 2013-11-21 DIAGNOSIS — K5900 Constipation, unspecified: Secondary | ICD-10-CM | POA: Insufficient documentation

## 2013-11-21 DIAGNOSIS — M549 Dorsalgia, unspecified: Secondary | ICD-10-CM

## 2013-11-21 DIAGNOSIS — F419 Anxiety disorder, unspecified: Secondary | ICD-10-CM | POA: Insufficient documentation

## 2013-11-21 DIAGNOSIS — C9000 Multiple myeloma not having achieved remission: Secondary | ICD-10-CM

## 2013-11-21 DIAGNOSIS — D630 Anemia in neoplastic disease: Secondary | ICD-10-CM

## 2013-11-21 DIAGNOSIS — G4700 Insomnia, unspecified: Secondary | ICD-10-CM

## 2013-11-21 DIAGNOSIS — Z5112 Encounter for antineoplastic immunotherapy: Secondary | ICD-10-CM

## 2013-11-21 DIAGNOSIS — R21 Rash and other nonspecific skin eruption: Secondary | ICD-10-CM

## 2013-11-21 DIAGNOSIS — F411 Generalized anxiety disorder: Secondary | ICD-10-CM

## 2013-11-21 MED ORDER — ONDANSETRON HCL 8 MG PO TABS
8.0000 mg | ORAL_TABLET | Freq: Once | ORAL | Status: AC
  Administered 2013-11-21: 8 mg via ORAL

## 2013-11-21 MED ORDER — ONDANSETRON HCL 8 MG PO TABS
ORAL_TABLET | ORAL | Status: AC
  Filled 2013-11-21: qty 1

## 2013-11-21 MED ORDER — BORTEZOMIB CHEMO SQ INJECTION 3.5 MG (2.5MG/ML)
1.3000 mg/m2 | Freq: Once | INTRAMUSCULAR | Status: AC
  Administered 2013-11-21: 2.25 mg via SUBCUTANEOUS
  Filled 2013-11-21: qty 2.25

## 2013-11-21 MED ORDER — ZOLEDRONIC ACID 4 MG/100ML IV SOLN
4.0000 mg | Freq: Once | INTRAVENOUS | Status: AC
  Administered 2013-11-21: 4 mg via INTRAVENOUS
  Filled 2013-11-21: qty 100

## 2013-11-21 NOTE — Assessment & Plan Note (Signed)
This is only mild reaction to chemotherapy. Will observe closely.

## 2013-11-21 NOTE — Assessment & Plan Note (Signed)
I recommend conservative management with over-the-counter analgesics. I recommend calcium with vitamin D. Will start her on monthly Zometa today.

## 2013-11-21 NOTE — Assessment & Plan Note (Signed)
She is currently on Remeron and will continue the same. There is a big component of anxiety. She is doing well

## 2013-11-21 NOTE — Assessment & Plan Note (Signed)
I recommend stool softener and Senokot as needed for constipation.

## 2013-11-21 NOTE — Progress Notes (Signed)
[facility] OFF[facility] PROGRESS NOTE  Patient Care Team: Jinny Sanders, MD as PCP - General (Family Medicine)  SUMMARY OF ONCOLOGIC HISTORY: Oncology History   Multiple myeloma, Ig A Lambda, M spike 3.54 grams, Calcium 9.2, Creatinine 0.8, Beta 2 microglobulin 4.52, IgA 4840 mg/dL, lambda light chain 75.4, albumin 3.6, hemoglobin 9.7, platelet 115    Primary site: Multiple Myeloma   Staging method: AJCC 6th Edition   Clinical: Stage IIA signed by [name], MD on 11/07/2013  2:46 PM   Summary: Stage IIA        Multiple myeloma, without mention of having achieved remission   10/31/2013 Bone Marrow Biopsy Bone marrow biopsy confirmed multiple myeloma with 40% bone marrow involvement. Skeletal survey showed minimal lesions in her score with generalized demineralization   11/10/2013 -  Chemotherapy The patient is started on induction chemotherapy with weekly dexamethasone 40 mg by mouth as well as Velcade subcutaneous injection on days 1, 4, 8 and 11. On 11/21/2013, she was started on monthly Zometa.    INTERVAL HISTORY: Please see below for problem oriented charting. She appears to be tolerating chemotherapy well.  REVIEW OF SYSTEMS:   Constitutional: Denies fevers, chills or abnormal weight loss Eyes: Denies blurriness of vision Ears, nose, mouth, throat, and face: Denies mucositis or sore throat Respiratory: Denies cough, dyspnea or wheezes Cardiovascular: Denies palpitation, chest discomfort or lower extremity swelling Lymphatics: Denies new lymphadenopathy or easy bruising Neurological:Denies numbness, tingling or new weaknesses Behavioral/Psych: Mood is stable, no new changes  All other systems were reviewed with the patient and are negative.  I have reviewed the past medical history, past surgical history, social history and family history with the patient and they are unchanged from previous note.  ALLERGIES:  is allergic to penicillins and sulfa  antibiotics.  MEDICATIONS:  Current Outpatient Prescriptions  Medication Sig Dispense Refill  . acyclovir (ZOVIRAX) 400 MG tablet Take 1 tablet (400 mg total) by mouth 2 (two) times daily.  60 tablet  3  . ALPRAZolam (XANAX) 0.5 MG tablet Take 1 tablet (0.5 mg total) by mouth 2 (two) times daily as needed for anxiety.  30 tablet  0  . carvedilol (COREG) 6.25 MG tablet Take 0.5 tablets (3.125 mg total) by mouth 2 (two) times daily with a meal.  90 tablet  3  . dexamethasone (DECADRON) 4 MG tablet Take 10 tablets (40 mg total) by mouth once a week.  100 tablet  1  . FIBER PO Take by mouth as needed.      . mirtazapine (REMERON) 15 MG tablet Take 15 mg by mouth as needed.      . Multiple Vitamins-Minerals (CENTRUM SILVER ULTRA WOMENS PO) Take by mouth daily.      . Omeprazole (PRILOSEC PO) Take by mouth as needed.      . ondansetron (ZOFRAN) 8 MG tablet Take 1 tablet (8 mg total) by mouth 2 (two) times daily. Start the day after chemo for 2 days. Then as needed for nausea or vomiting.  30 tablet  1  . Probiotic Product (ALIGN) 4 MG CAPS Take 1 capsule by mouth daily.       No current facility-administered medications for this visit.    PHYSICAL EXAMINATION: ECOG PERFORMANCE STATUS: 1 - Symptomatic but completely ambulatory  Filed Vitals:   11/21/13 1216  BP: 145/66  Pulse: 75  Temp: 98.1 F (36.7 C)  Resp: 18   Filed Weights   11/21/13 1216  Weight: 145 lb (  65.772 kg)    GENERAL:alert, no distress and comfortable SKIN: She has mild skin rash at the injection site.  EYES: normal, Conjunctiva are pink and non-injected, sclera clear OROPHARYNX:no exudate, no erythema and lips, buccal mucosa, and tongue normal  NECK: supple, thyroid normal size, non-tender, without nodularity LYMPH:  no palpable lymphadenopathy in the cervical, axillary or inguinal LUNGS: clear to auscultation and percussion with normal breathing effort HEART: regular rate & rhythm and no murmurs and no lower  extremity edema ABDOMEN:abdomen soft, non-tender and normal bowel sounds Musculoskeletal:no cyanosis of digits and no clubbing  NEURO: alert & oriented x 3 with fluent speech, no focal motor/sensory deficits  LABORATORY DATA:  I have reviewed the data as listed    Component Value Date/Time   NA 140 11/18/2013 1448   NA 138 10/28/2013 0955   K 3.6 11/18/2013 1448   K 3.9 10/28/2013 0955   CL 99 10/28/2013 0955   CO2 23 11/18/2013 1448   CO2 28 10/28/2013 0955   GLUCOSE 111 11/18/2013 1448   GLUCOSE 100* 10/28/2013 0955   BUN 17.4 11/18/2013 1448   BUN 11 10/28/2013 0955   CREATININE 0.8 11/18/2013 1448   CREATININE 0.8 10/28/2013 0955   CALCIUM 9.1 11/18/2013 1448   CALCIUM 9.3 10/28/2013 0955   PROT 8.6* 11/18/2013 1448   PROT 9.5* 08/08/2013 1127   ALBUMIN 3.5 11/18/2013 1448   ALBUMIN 3.7 08/08/2013 1127   AST 15 11/18/2013 1448   AST 19 08/08/2013 1127   ALT 12 11/18/2013 1448   ALT 19 08/08/2013 1127   ALKPHOS 46 11/18/2013 1448   ALKPHOS 41 08/08/2013 1127   BILITOT 0.24 11/18/2013 1448   BILITOT 0.6 08/08/2013 1127    No results found for this basename: SPEP, UPEP,  kappa and lambda light chains    Lab Results  Component Value Date   WBC 18.4* 11/18/2013   NEUTROABS 12.8* 11/18/2013   HGB 9.7* 11/18/2013   HCT 29.3* 11/18/2013   MCV 99.4 11/18/2013   PLT 112* 11/18/2013      Chemistry      Component Value Date/Time   NA 140 11/18/2013 1448   NA 138 10/28/2013 0955   K 3.6 11/18/2013 1448   K 3.9 10/28/2013 0955   CL 99 10/28/2013 0955   CO2 23 11/18/2013 1448   CO2 28 10/28/2013 0955   BUN 17.4 11/18/2013 1448   BUN 11 10/28/2013 0955   CREATININE 0.8 11/18/2013 1448   CREATININE 0.8 10/28/2013 0955      Component Value Date/Time   CALCIUM 9.1 11/18/2013 1448   CALCIUM 9.3 10/28/2013 0955   ALKPHOS 46 11/18/2013 1448   ALKPHOS 41 08/08/2013 1127   AST 15 11/18/2013 1448   AST 19 08/08/2013 1127   ALT 12 11/18/2013 1448   ALT 19 08/08/2013 1127   BILITOT 0.24 11/18/2013 1448   BILITOT 0.6 08/08/2013 1127      ASSESSMENT & PLAN:  Multiple myeloma, without mention of having achieved remission She still had persistent musculoskeletal pain. Denies any mucositis or nausea. She has very mild skin rash at the injection site. Plan to continue treatment without dosage adjustment. We discussed about the role of bone marrow transplant. She is interested to have a visit with the bone marrow transplant team at [facility]. I will refer her there. I will continue treatment today without dose adjustment. She has received dental clearance recently. I will present with monthly Zometa today. She will continue prophylactic antiviral  medication with acyclovir while on Velcade injections.   Constipation I recommend stool softener and Senokot as needed for constipation.  Rash This is only mild reaction to chemotherapy. Will observe closely.  Thrombocytopenia Continue to monitor closely.  Anemia in neoplastic disease This is likely anemia of chronic disease. The patient denies recent history of bleeding such as epistaxis, hematuria or hematochezia. She is asymptomatic from the anemia. We will observe for now.  She does not require transfusion now.    Back pain I recommend conservative management with over-the-counter analgesics. I recommend calcium with vitamin D. Will start her on monthly Zometa today.  Insomnia She is currently on Remeron and will continue the same. There is a big component of anxiety. She is doing well  Anxiety She is doing well with Xanax as needed. She appears to be coping well currently.   All questions were answered. The patient knows to call the clinic with any problems, questions or concerns. No barriers to learning was detected.    [name], MD 11/21/2013 4:27 PM

## 2013-11-21 NOTE — Assessment & Plan Note (Addendum)
She still had persistent musculoskeletal pain. Denies any mucositis or nausea. She has very mild skin rash at the injection site. Plan to continue treatment without dosage adjustment. We discussed about the role of bone marrow transplant. She is interested to have a visit with the bone marrow transplant team at [facility]. I will refer her there. I will continue treatment today without dose adjustment. She has received dental clearance recently. I will present with monthly Zometa today. She will continue prophylactic antiviral medication with acyclovir while on Velcade injections.

## 2013-11-21 NOTE — Assessment & Plan Note (Signed)
Continue to monitor closely

## 2013-11-21 NOTE — Patient Instructions (Signed)
[facility] Discharge Instructions for Patients Receiving Chemotherapy  Today you received the following chemotherapy agents: Velcade. To help prevent nausea and vomiting after your treatment, we encourage you to take your nausea medication.   If you develop nausea and vomiting that is not controlled by your nausea medication, call the clinic.   BELOW ARE SYMPTOMS THAT SHOULD BE REPORTED IMMEDIATELY:  *FEVER GREATER THAN 100.5 F  *CHILLS WITH OR WITHOUT FEVER  NAUSEA AND VOMITING THAT IS NOT CONTROLLED WITH YOUR NAUSEA MEDICATION  *UNUSUAL SHORTNESS OF BREATH  *UNUSUAL BRUISING OR BLEEDING  TENDERNESS IN MOUTH AND THROAT WITH OR WITHOUT PRESENCE OF ULCERS  *URINARY PROBLEMS  *BOWEL PROBLEMS  UNUSUAL RASH Items with * indicate a potential emergency and should be followed up as soon as possible.  Feel free to call the clinic you have any questions or concerns. The clinic phone number is [phone].  Zoledronic Acid injection (Hypercalcemia, Oncology) What is this medicine? ZOLEDRONIC ACID (ZOE le dron ik AS id) lowers the amount of calcium loss from bone. It is used to treat too much calcium in your blood from cancer. It is also used to prevent complications of cancer that has spread to the bone. This medicine may be used for other purposes; ask your health care provider or pharmacist if you have questions. COMMON BRAND NAME(S): Zometa What should I tell my health care provider before I take this medicine? They need to know if you have any of these conditions: -aspirin-sensitive asthma -cancer, especially if you are receiving medicines used to treat cancer -dental disease or wear dentures -infection -kidney disease -receiving corticosteroids like dexamethasone or prednisone -an unusual or allergic reaction to zoledronic acid, other medicines, foods, dyes, or preservatives -pregnant or trying to get pregnant -breast-feeding How should I use this  medicine? This medicine is for infusion into a vein. It is given by a health care professional in a hospital or clinic setting. Talk to your pediatrician regarding the use of this medicine in children. Special care may be needed. Overdosage: If you think you have taken too much of this medicine contact a poison control center or emergency room at once. NOTE: This medicine is only for you. Do not share this medicine with others. What if I miss a dose? It is important not to miss your dose. Call your doctor or health care professional if you are unable to keep an appointment. What may interact with this medicine? -certain antibiotics given by injection -NSAIDs, medicines for pain and inflammation, like ibuprofen or naproxen -some diuretics like bumetanide, furosemide -teriparatide -thalidomide This list may not describe all possible interactions. Give your health care provider a list of all the medicines, herbs, non-prescription drugs, or dietary supplements you use. Also tell them if you smoke, drink alcohol, or use illegal drugs. Some items may interact with your medicine. What should I watch for while using this medicine? Visit your doctor or health care professional for regular checkups. It may be some time before you see the benefit from this medicine. Do not stop taking your medicine unless your doctor tells you to. Your doctor may order blood tests or other tests to see how you are doing. Women should inform their doctor if they wish to become pregnant or think they might be pregnant. There is a potential for serious side effects to an unborn child. Talk to your health care professional or pharmacist for more information. You should make sure that you get enough calcium and vitamin  D while you are taking this medicine. Discuss the foods you eat and the vitamins you take with your health care professional. Some people who take this medicine have severe bone, joint, and/or muscle pain. This  medicine may also increase your risk for jaw problems or a broken thigh bone. Tell your doctor right away if you have severe pain in your jaw, bones, joints, or muscles. Tell your doctor if you have any pain that does not go away or that gets worse. Tell your dentist and dental surgeon that you are taking this medicine. You should not have major dental surgery while on this medicine. See your dentist to have a dental exam and fix any dental problems before starting this medicine. Take good care of your teeth while on this medicine. Make sure you see your dentist for regular follow-up appointments. What side effects may I notice from receiving this medicine? Side effects that you should report to your doctor or health care professional as soon as possible: -allergic reactions like skin rash, itching or hives, swelling of the face, lips, or tongue -anxiety, confusion, or depression -breathing problems -changes in vision -eye pain -feeling faint or lightheaded, falls -jaw pain, especially after dental work -mouth sores -muscle cramps, stiffness, or weakness -trouble passing urine or change in the amount of urine Side effects that usually do not require medical attention (report to your doctor or health care professional if they continue or are bothersome): -bone, joint, or muscle pain -constipation -diarrhea -fever -hair loss -irritation at site where injected -loss of appetite -nausea, vomiting -stomach upset -trouble sleeping -trouble swallowing -weak or tired This list may not describe all possible side effects. Call your doctor for medical advice about side effects. You may report side effects to FDA at 1-800-FDA-1088. Where should I keep my medicine? This drug is given in a hospital or clinic and will not be stored at home. NOTE: This sheet is a summary. It may not cover all possible information. If you have questions about this medicine, talk to your doctor, pharmacist, or health  care provider.  2014, [employer]/Gold Standard. (2012-11-21 13:03:13)

## 2013-11-21 NOTE — Assessment & Plan Note (Signed)
She is doing well with Xanax as needed. She appears to be coping well currently.

## 2013-11-21 NOTE — Assessment & Plan Note (Signed)
This is likely anemia of chronic disease. The patient denies recent history of bleeding such as epistaxis, hematuria or hematochezia. She is asymptomatic from the anemia. We will observe for now.  She does not require transfusion now.   

## 2013-11-21 NOTE — Telephone Encounter (Signed)
per pof to sch pt appt-gave pt copy of sch °

## 2013-11-24 ENCOUNTER — Ambulatory Visit: Payer: Medicare HMO | Admitting: Internal Medicine

## 2013-11-24 ENCOUNTER — Telehealth: Payer: Self-pay | Admitting: *Deleted

## 2013-11-24 ENCOUNTER — Telehealth: Payer: Self-pay | Admitting: Hematology and Oncology

## 2013-11-24 NOTE — Telephone Encounter (Signed)
Per voicemail from desk  RN I have scheduled appts. Patient notified

## 2013-11-24 NOTE — Telephone Encounter (Signed)
Pt appt to see Dr. Cassell Clement @ [facility] is 12/11/13@9 :15 Medical records faxed Slides and scans will be fedex'ed Pt is aware

## 2013-11-24 NOTE — Telephone Encounter (Signed)
Informed pt ok to take multivitamin and probiotic.   She verbalized understanding.  She also wants to change the date of her appt at [facility] w/ Dr. Cassell Clement.  States her children are visiting her on that date.  Gave pt the phone number to Dr. Gerre Scull office to r/s her appt.  She will try to contact them herself directly and call us back if any problems.

## 2013-11-24 NOTE — Telephone Encounter (Signed)
Pt left VM wants to know if Dr. Alvy Bimler wants her to continue taking Multivitamin and Probiotic?  She is taking Vitamin D and a Probiotic.

## 2013-11-24 NOTE — Telephone Encounter (Signed)
Both are fine.

## 2013-11-27 ENCOUNTER — Encounter: Payer: Self-pay | Admitting: Hematology and Oncology

## 2013-11-28 ENCOUNTER — Encounter: Payer: Self-pay | Admitting: Hematology and Oncology

## 2013-11-28 NOTE — Progress Notes (Signed)
Message left for patient, maybe from daughter called back and left a message.

## 2013-12-02 ENCOUNTER — Encounter: Payer: Self-pay | Admitting: Hematology and Oncology

## 2013-12-02 ENCOUNTER — Telehealth: Payer: Self-pay | Admitting: Hematology and Oncology

## 2013-12-02 ENCOUNTER — Ambulatory Visit (HOSPITAL_BASED_OUTPATIENT_CLINIC_OR_DEPARTMENT_OTHER): Payer: Medicare HMO

## 2013-12-02 ENCOUNTER — Ambulatory Visit (HOSPITAL_BASED_OUTPATIENT_CLINIC_OR_DEPARTMENT_OTHER): Payer: Medicare HMO | Admitting: Hematology and Oncology

## 2013-12-02 ENCOUNTER — Other Ambulatory Visit (HOSPITAL_BASED_OUTPATIENT_CLINIC_OR_DEPARTMENT_OTHER): Payer: Medicare HMO

## 2013-12-02 ENCOUNTER — Other Ambulatory Visit: Payer: Self-pay | Admitting: Hematology and Oncology

## 2013-12-02 ENCOUNTER — Telehealth: Payer: Self-pay | Admitting: *Deleted

## 2013-12-02 VITALS — BP 142/66 | HR 87 | Temp 98.4°F | Resp 20 | Ht 63.0 in | Wt 144.2 lb

## 2013-12-02 DIAGNOSIS — F419 Anxiety disorder, unspecified: Secondary | ICD-10-CM

## 2013-12-02 DIAGNOSIS — D630 Anemia in neoplastic disease: Secondary | ICD-10-CM

## 2013-12-02 DIAGNOSIS — C9000 Multiple myeloma not having achieved remission: Secondary | ICD-10-CM

## 2013-12-02 DIAGNOSIS — R21 Rash and other nonspecific skin eruption: Secondary | ICD-10-CM

## 2013-12-02 DIAGNOSIS — D696 Thrombocytopenia, unspecified: Secondary | ICD-10-CM

## 2013-12-02 DIAGNOSIS — I6529 Occlusion and stenosis of unspecified carotid artery: Secondary | ICD-10-CM

## 2013-12-02 DIAGNOSIS — M949 Disorder of cartilage, unspecified: Secondary | ICD-10-CM

## 2013-12-02 DIAGNOSIS — F411 Generalized anxiety disorder: Secondary | ICD-10-CM

## 2013-12-02 DIAGNOSIS — Z5112 Encounter for antineoplastic immunotherapy: Secondary | ICD-10-CM

## 2013-12-02 DIAGNOSIS — M8580 Other specified disorders of bone density and structure, unspecified site: Secondary | ICD-10-CM

## 2013-12-02 DIAGNOSIS — M899 Disorder of bone, unspecified: Secondary | ICD-10-CM

## 2013-12-02 LAB — COMPREHENSIVE METABOLIC PANEL (CC13)
ALT: 14 U/L (ref 0–55)
AST: 15 U/L (ref 5–34)
Albumin: 3.6 g/dL (ref 3.5–5.0)
Alkaline Phosphatase: 43 U/L (ref 40–150)
Anion Gap: 11 mEq/L (ref 3–11)
BUN: 16.9 mg/dL (ref 7.0–26.0)
CO2: 25 mEq/L (ref 22–29)
Calcium: 9.4 mg/dL (ref 8.4–10.4)
Chloride: 105 mEq/L (ref 98–109)
Creatinine: 0.9 mg/dL (ref 0.6–1.1)
Glucose: 109 mg/dl (ref 70–140)
Potassium: 3.6 mEq/L (ref 3.5–5.1)
Sodium: 142 mEq/L (ref 136–145)
Total Bilirubin: 0.3 mg/dL (ref 0.20–1.20)
Total Protein: 8.8 g/dL — ABNORMAL HIGH (ref 6.4–8.3)

## 2013-12-02 LAB — TECHNOLOGIST REVIEW

## 2013-12-02 LAB — CBC WITH DIFFERENTIAL/PLATELET
BASO%: 0.1 % (ref 0.0–2.0)
Basophils Absolute: 0 10*3/uL (ref 0.0–0.1)
EOS%: 0 % (ref 0.0–7.0)
Eosinophils Absolute: 0 10*3/uL (ref 0.0–0.5)
HCT: 30.4 % — ABNORMAL LOW (ref 34.8–46.6)
HGB: 9.8 g/dL — ABNORMAL LOW (ref 11.6–15.9)
LYMPH%: 11.6 % — ABNORMAL LOW (ref 14.0–49.7)
MCH: 32.6 pg (ref 25.1–34.0)
MCHC: 32.2 g/dL (ref 31.5–36.0)
MCV: 101 fL (ref 79.5–101.0)
MONO#: 2.1 10*3/uL — ABNORMAL HIGH (ref 0.1–0.9)
MONO%: 13.4 % (ref 0.0–14.0)
NEUT#: 11.6 10*3/uL — ABNORMAL HIGH (ref 1.5–6.5)
NEUT%: 74.9 % (ref 38.4–76.8)
Platelets: 121 10*3/uL — ABNORMAL LOW (ref 145–400)
RBC: 3.01 10*6/uL — ABNORMAL LOW (ref 3.70–5.45)
RDW: 16.3 % — ABNORMAL HIGH (ref 11.2–14.5)
WBC: 15.5 10*3/uL — ABNORMAL HIGH (ref 3.9–10.3)
lymph#: 1.8 10*3/uL (ref 0.9–3.3)

## 2013-12-02 MED ORDER — ONDANSETRON HCL 8 MG PO TABS: 8.0000 mg | ORAL_TABLET | Freq: Once | ORAL | Status: DC

## 2013-12-02 MED ORDER — BORTEZOMIB CHEMO SQ INJECTION 3.5 MG (2.5MG/ML)
1.3000 mg/m2 | Freq: Once | INTRAMUSCULAR | Status: AC
  Administered 2013-12-02: 2.25 mg via SUBCUTANEOUS
  Filled 2013-12-02: qty 2.25

## 2013-12-02 NOTE — Assessment & Plan Note (Signed)
This is likely due to recent treatment. The patient denies recent history of bleeding such as epistaxis, hematuria or hematochezia. She is asymptomatic from the low platelet count. I will observe for now.  she does not require transfusion now. I will continue the chemotherapy at current dose without dosage adjustment.  If the thrombocytopenia gets progressive worse in the future, I might have to delay her treatment or adjust the chemotherapy dose.   

## 2013-12-02 NOTE — Assessment & Plan Note (Signed)
This is minor, related to side effects of treatment. I recommended close observation.

## 2013-12-02 NOTE — Progress Notes (Signed)
[place] OFFICE PROGRESS NOTE  Patient Care Team: Jinny Sanders, MD as PCP - General (Family Medicine) [name], MD as Referring Physician (Hematology and Oncology)  SUMMARY OF ONCOLOGIC HISTORY: Oncology History   Multiple myeloma, Ig A Lambda, M spike 3.54 grams, Calcium 9.2, Creatinine 0.8, Beta 2 microglobulin 4.52, IgA 4840 mg/dL, lambda light chain 75.4, albumin 3.6, hemoglobin 9.7, platelet 115    Primary site: Multiple Myeloma   Staging method: AJCC 6th Edition   Clinical: Stage IIA signed by [name], MD on 11/07/2013  2:46 PM   Summary: Stage IIA        Multiple myeloma, without mention of having achieved remission   10/31/2013 Bone Marrow Biopsy Bone marrow biopsy confirmed multiple myeloma with 40% bone marrow involvement. Skeletal survey showed minimal lesions in her score with generalized demineralization   11/10/2013 -  Chemotherapy The patient is started on induction chemotherapy with weekly dexamethasone 40 mg by mouth as well as Velcade subcutaneous injection on days 1, 4, 8 and 11. On 11/21/2013, she was started on monthly Zometa.    INTERVAL HISTORY: Please see below for problem oriented charting. She is seen prior to cycle 2 of treatment. She tolerates treatment well.  REVIEW OF SYSTEMS:   Constitutional: Denies fevers, chills or abnormal weight loss Eyes: Denies blurriness of vision Ears, nose, mouth, throat, and face: Denies mucositis or sore throat Respiratory: Denies cough, dyspnea or wheezes Cardiovascular: Denies palpitation, chest discomfort or lower extremity swelling Gastrointestinal:  Denies nausea, heartburn or change in bowel habits Lymphatics: Denies new lymphadenopathy or easy bruising Neurological:Denies numbness, tingling or new weaknesses Behavioral/Psych: Mood is stable, no new changes  All other systems were reviewed with the patient and are negative.  I have reviewed the past medical history, past surgical  history, social history and family history with the patient and they are unchanged from previous note.  ALLERGIES:  is allergic to penicillins and sulfa antibiotics.  MEDICATIONS:  Current Outpatient Prescriptions  Medication Sig Dispense Refill  . acyclovir (ZOVIRAX) 400 MG tablet Take 1 tablet (400 mg total) by mouth 2 (two) times daily.  60 tablet  3  . ALPRAZolam (XANAX) 0.5 MG tablet Take 1 tablet (0.5 mg total) by mouth 2 (two) times daily as needed for anxiety.  30 tablet  0  . carvedilol (COREG) 6.25 MG tablet Take 0.5 tablets (3.125 mg total) by mouth 2 (two) times daily with a meal.  90 tablet  3  . cholecalciferol (VITAMIN D) 1000 UNITS tablet Take 1,000 Units by mouth daily.      Marland Kitchen dexamethasone (DECADRON) 4 MG tablet Take 10 tablets (40 mg total) by mouth once a week.  100 tablet  1  . docusate sodium (COLACE) 100 MG capsule Take 100 mg by mouth 2 (two) times daily.      Marland Kitchen FIBER PO Take by mouth as needed.      . mirtazapine (REMERON) 15 MG tablet TAKE 1 TABLET BY MOUTH AT BEDTIME  30 tablet  0  . Multiple Vitamins-Minerals (CENTRUM SILVER ULTRA WOMENS PO) Take by mouth daily.      . Omeprazole (PRILOSEC PO) Take by mouth as needed.      . ondansetron (ZOFRAN) 8 MG tablet Take 1 tablet (8 mg total) by mouth 2 (two) times daily. Start the day after chemo for 2 days. Then as needed for nausea or vomiting.  30 tablet  1  . Probiotic Product (ALIGN) 4 MG CAPS Take  1 capsule by mouth daily.       No current facility-administered medications for this visit.    PHYSICAL EXAMINATION: ECOG PERFORMANCE STATUS: 1 - Symptomatic but completely ambulatory  Filed Vitals:   12/02/13 1127  BP: 142/66  Pulse: 87  Temp: 98.4 F (36.9 C)  Resp: 20   Filed Weights   12/02/13 1127  Weight: 144 lb 3.2 oz (65.409 kg)    GENERAL:alert, no distress and comfortable SKIN: Mild skin rashes noted at the injection sites. EYES: normal, Conjunctiva are pink and non-injected, sclera  clear OROPHARYNX:no exudate, no erythema and lips, buccal mucosa, and tongue normal  NECK: supple, thyroid normal size, non-tender, without nodularity LYMPH:  no palpable lymphadenopathy in the cervical, axillary or inguinal LUNGS: clear to auscultation and percussion with normal breathing effort HEART: regular rate & rhythm and no murmurs and no lower extremity edema ABDOMEN:abdomen soft, non-tender and normal bowel sounds Musculoskeletal:no cyanosis of digits and no clubbing  NEURO: alert & oriented x 3 with fluent speech, no focal motor/sensory deficits  LABORATORY DATA:  I have reviewed the data as listed    Component Value Date/Time   NA 142 12/02/2013 1114   NA 138 10/28/2013 0955   K 3.6 12/02/2013 1114   K 3.9 10/28/2013 0955   CL 99 10/28/2013 0955   CO2 25 12/02/2013 1114   CO2 28 10/28/2013 0955   GLUCOSE 109 12/02/2013 1114   GLUCOSE 100* 10/28/2013 0955   BUN 16.9 12/02/2013 1114   BUN 11 10/28/2013 0955   CREATININE 0.9 12/02/2013 1114   CREATININE 0.8 10/28/2013 0955   CALCIUM 9.4 12/02/2013 1114   CALCIUM 9.3 10/28/2013 0955   PROT 8.8* 12/02/2013 1114   PROT 9.5* 08/08/2013 1127   ALBUMIN 3.6 12/02/2013 1114   ALBUMIN 3.7 08/08/2013 1127   AST 15 12/02/2013 1114   AST 19 08/08/2013 1127   ALT 14 12/02/2013 1114   ALT 19 08/08/2013 1127   ALKPHOS 43 12/02/2013 1114   ALKPHOS 41 08/08/2013 1127   BILITOT 0.30 12/02/2013 1114   BILITOT 0.6 08/08/2013 1127    No results found for this basename: SPEP, UPEP,  kappa and lambda light chains    Lab Results  Component Value Date   WBC 15.5* 12/02/2013   NEUTROABS 11.6* 12/02/2013   HGB 9.8* 12/02/2013   HCT 30.4* 12/02/2013   MCV 101.0 12/02/2013   PLT 121* 12/02/2013      Chemistry      Component Value Date/Time   NA 142 12/02/2013 1114   NA 138 10/28/2013 0955   K 3.6 12/02/2013 1114   K 3.9 10/28/2013 0955   CL 99 10/28/2013 0955   CO2 25 12/02/2013 1114   CO2 28 10/28/2013 0955   BUN 16.9 12/02/2013 1114   BUN 11 10/28/2013 0955   CREATININE 0.9 12/02/2013 1114    CREATININE 0.8 10/28/2013 0955      Component Value Date/Time   CALCIUM 9.4 12/02/2013 1114   CALCIUM 9.3 10/28/2013 0955   ALKPHOS 43 12/02/2013 1114   ALKPHOS 41 08/08/2013 1127   AST 15 12/02/2013 1114   AST 19 08/08/2013 1127   ALT 14 12/02/2013 1114   ALT 19 08/08/2013 1127   BILITOT 0.30 12/02/2013 1114   BILITOT 0.6 08/08/2013 1127     ASSESSMENT & PLAN:  Multiple myeloma, without mention of having achieved remission Overall, she tolerated treatment well upon from expected side effects. She will proceed with treatment without a dose adjustment. She has  appointment this week for transplant evaluation. She had a lot of questions related to the role of transplant and I shared with her my opinion. I will see her back at the end of the month prior to cycle 3 of therapy.  Anemia in neoplastic disease This is likely anemia of chronic disease. The patient denies recent history of bleeding such as epistaxis, hematuria or hematochezia. She is asymptomatic from the anemia. We will observe for now.  She does not require transfusion now.    Thrombocytopenia This is likely due to recent treatment. The patient denies recent history of bleeding such as epistaxis, hematuria or hematochezia. She is asymptomatic from the low platelet count. I will observe for now.  she does not require transfusion now. I will continue the chemotherapy at current dose without dosage adjustment.  If the thrombocytopenia gets progressive worse in the future, I might have to delay her treatment or adjust the chemotherapy dose.    Rash This is minor, related to side effects of treatment. I recommended close observation.  Osteopenia Continue calcium with vitamin D.  Anxiety She is doing well with her Xanax when necessary.  Carotid stenosis I recommended she resume aspirin therapy.   Orders Placed This Encounter  Procedures  . SPEP & IFE with QIG    Standing Status: Future     Number of Occurrences:      Standing  Expiration Date: 12/03/2014  . Kappa/lambda light chains    Standing Status: Future     Number of Occurrences:      Standing Expiration Date: 12/03/2014  . Beta 2 microglobulin, serum    Standing Status: Future     Number of Occurrences:      Standing Expiration Date: 12/03/2014   All questions were answered. The patient knows to call the clinic with any problems, questions or concerns. No barriers to learning was detected.    [name], MD 12/02/2013 8:48 PM

## 2013-12-02 NOTE — Assessment & Plan Note (Signed)
She is doing well with her Xanax when necessary.

## 2013-12-02 NOTE — Assessment & Plan Note (Signed)
This is likely anemia of chronic disease. The patient denies recent history of bleeding such as epistaxis, hematuria or hematochezia. She is asymptomatic from the anemia. We will observe for now.  She does not require transfusion now.   

## 2013-12-02 NOTE — Assessment & Plan Note (Signed)
Overall, she tolerated treatment well upon from expected side effects. She will proceed with treatment without a dose adjustment. She has appointment this week for transplant evaluation. She had a lot of questions related to the role of transplant and I shared with her my opinion. I will see her back at the end of the month prior to cycle 3 of therapy.

## 2013-12-02 NOTE — Assessment & Plan Note (Signed)
I recommended she resume aspirin therapy.

## 2013-12-02 NOTE — Assessment & Plan Note (Signed)
Continue calcium with vitamin D.  

## 2013-12-02 NOTE — Patient Instructions (Signed)
[facility] Discharge Instructions for Patients Receiving Chemotherapy  Today you received the following chemotherapy agents: Velcade.  To help prevent nausea and vomiting after your treatment, we encourage you to take your nausea medication as prescribed.   If you develop nausea and vomiting that is not controlled by your nausea medication, call the clinic.   BELOW ARE SYMPTOMS THAT SHOULD BE REPORTED IMMEDIATELY:  *FEVER GREATER THAN 100.5 F  *CHILLS WITH OR WITHOUT FEVER  NAUSEA AND VOMITING THAT IS NOT CONTROLLED WITH YOUR NAUSEA MEDICATION  *UNUSUAL SHORTNESS OF BREATH  *UNUSUAL BRUISING OR BLEEDING  TENDERNESS IN MOUTH AND THROAT WITH OR WITHOUT PRESENCE OF ULCERS  *URINARY PROBLEMS  *BOWEL PROBLEMS  UNUSUAL RASH Items with * indicate a potential emergency and should be followed up as soon as possible.  Feel free to call the clinic you have any questions or concerns. The clinic phone number is [phone].    

## 2013-12-02 NOTE — Telephone Encounter (Signed)
Per staff message and POF I have scheduled appts. Advised scheduler that 7/3 is closed, need new date for treatment.  JMW

## 2013-12-02 NOTE — Telephone Encounter (Signed)
Gave pt appt for lab and MD for june and July , emailed Sharyn Lull regarding chemo

## 2013-12-04 ENCOUNTER — Telehealth: Payer: Self-pay | Admitting: Hematology and Oncology

## 2013-12-04 NOTE — Telephone Encounter (Signed)
Talked to pt she is aware of all appt , regarding 7/3 MD will explain to pt on 6/30

## 2013-12-05 ENCOUNTER — Ambulatory Visit (HOSPITAL_BASED_OUTPATIENT_CLINIC_OR_DEPARTMENT_OTHER): Payer: Medicare HMO

## 2013-12-05 ENCOUNTER — Telehealth: Payer: Self-pay | Admitting: *Deleted

## 2013-12-05 VITALS — BP 128/59 | HR 75 | Temp 98.9°F | Resp 18

## 2013-12-05 DIAGNOSIS — C9000 Multiple myeloma not having achieved remission: Secondary | ICD-10-CM

## 2013-12-05 DIAGNOSIS — Z5112 Encounter for antineoplastic immunotherapy: Secondary | ICD-10-CM

## 2013-12-05 MED ORDER — BORTEZOMIB CHEMO SQ INJECTION 3.5 MG (2.5MG/ML)
1.3000 mg/m2 | Freq: Once | INTRAMUSCULAR | Status: AC
Start: 2013-12-05 — End: 2013-12-05
  Administered 2013-12-05: 2.25 mg via SUBCUTANEOUS
  Filled 2013-12-05: qty 2.25

## 2013-12-05 MED ORDER — ONDANSETRON HCL 8 MG PO TABS
8.0000 mg | ORAL_TABLET | Freq: Once | ORAL | Status: AC
  Administered 2013-12-05: 8 mg via ORAL

## 2013-12-05 MED ORDER — ONDANSETRON HCL 8 MG PO TABS
ORAL_TABLET | ORAL | Status: AC
  Filled 2013-12-05: qty 1

## 2013-12-05 NOTE — Telephone Encounter (Signed)
Pt asking to clarify her chemo schedule.  Day #4 of Cycle #3 lands on July 3.  We are closed July 3 rd, so it will be missed .  Her next treatment is scheduled for July 7th.  Pt concerned about missing this day of treatment.  She has appt w/ Dr. Alvy Bimler on 6/30 and another RN suggested she speak w/ Dr. Alvy Bimler on that appt..  Pt states she would rather have her schedule fixed now.  Pt is aware we are closed on July 3, but asks if the scheduled should be adjusted in some way and not just skip a treatment altogether?

## 2013-12-05 NOTE — Patient Instructions (Signed)
Bortezomib injection What is this medicine? BORTEZOMIB (bor TEZ oh mib) is a chemotherapy drug. It slows the growth of cancer cells. This medicine is used to treat multiple myeloma, lymphoma, and other cancers. This medicine may be used for other purposes; ask your health care provider or pharmacist if you have questions. COMMON BRAND NAME(S): Velcade What should I tell my health care provider before I take this medicine? They need to know if you have any of these conditions: -heart disease -irregular heartbeat -liver disease -low blood counts, like low white blood cells, platelets, or hemoglobin -peripheral neuropathy -taking medicine for blood pressure -an unusual or allergic reaction to bortezomib, mannitol, boron, other medicines, foods, dyes, or preservatives -pregnant or trying to get pregnant -breast-feeding How should I use this medicine? This medicine is for injection into a vein or for injection under the skin. It is given by a health care professional in a hospital or clinic setting. Talk to your pediatrician regarding the use of this medicine in children. Special care may be needed. Overdosage: If you think you have taken too much of this medicine contact a poison control center or emergency room at once. NOTE: This medicine is only for you. Do not share this medicine with others. What if I miss a dose? It is important not to miss your dose. Call your doctor or health care professional if you are unable to keep an appointment. What may interact with this medicine? -medicines for diabetes -medicines to increase blood counts like filgrastim, pegfilgrastim, sargramostim -zalcitabine Talk to your doctor or health care professional before taking any of these medicines: -acetaminophen -aspirin -ibuprofen -ketoprofen -naproxen This list may not describe all possible interactions. Give your health care provider a list of all the medicines, herbs, non-prescription drugs, or  dietary supplements you use. Also tell them if you smoke, drink alcohol, or use illegal drugs. Some items may interact with your medicine. What should I watch for while using this medicine? Visit your doctor for checks on your progress. This drug may make you feel generally unwell. This is not uncommon, as chemotherapy can affect healthy cells as well as cancer cells. Report any side effects. Continue your course of treatment even though you feel ill unless your doctor tells you to stop. You may get drowsy or dizzy. Do not drive, use machinery, or do anything that needs mental alertness until you know how this medicine affects you. Do not stand or sit up quickly, especially if you are an older patient. This reduces the risk of dizzy or fainting spells. In some cases, you may be given additional medicines to help with side effects. Follow all directions for their use. Call your doctor or health care professional for advice if you get a fever, chills or sore throat, or other symptoms of a cold or flu. Do not treat yourself. This drug decreases your body's ability to fight infections. Try to avoid being around people who are sick. This medicine may increase your risk to bruise or bleed. Call your doctor or health care professional if you notice any unusual bleeding. Be careful brushing and flossing your teeth or using a toothpick because you may get an infection or bleed more easily. If you have any dental work done, tell your dentist you are receiving this medicine. Avoid taking products that contain aspirin, acetaminophen, ibuprofen, naproxen, or ketoprofen unless instructed by your doctor. These medicines may hide a fever. Do not become pregnant while taking this medicine. Women should inform their doctor   if they wish to become pregnant or think they might be pregnant. There is a potential for serious side effects to an unborn child. Talk to your health care professional or pharmacist for more information.  Do not breast-feed an infant while taking this medicine. You may have vomiting or diarrhea while taking this medicine. Drink water or other fluids as directed. What side effects may I notice from receiving this medicine? Side effects that you should report to your doctor or health care professional as soon as possible: -allergic reactions like skin rash, itching or hives, swelling of the face, lips, or tongue -breathing problems -changes in hearing -changes in vision -fast, irregular heartbeat -feeling faint or lightheaded, falls -pain, tingling, numbness in the hands or feet -seizures -swelling of the ankles, feet, hands -unusual bleeding or bruising -unusually weak or tired -vomiting Side effects that usually do not require medical attention (report to your doctor or health care professional if they continue or are bothersome): -changes in emotions or moods -constipation -diarrhea -loss of appetite -headache -irritation at site where injected -nausea This list may not describe all possible side effects. Call your doctor for medical advice about side effects. You may report side effects to FDA at 1-800-FDA-1088. Where should I keep my medicine? This drug is given in a hospital or clinic and will not be stored at home. NOTE: This sheet is a summary. It may not cover all possible information. If you have questions about this medicine, talk to your doctor, pharmacist, or health care provider.  2014, [employer]. (2010-07-20 11:42:36)  

## 2013-12-06 NOTE — Telephone Encounter (Signed)
There is no way around it unless she wants to delay chemo by 1 week

## 2013-12-09 ENCOUNTER — Ambulatory Visit: Payer: Medicare HMO

## 2013-12-09 ENCOUNTER — Other Ambulatory Visit (HOSPITAL_BASED_OUTPATIENT_CLINIC_OR_DEPARTMENT_OTHER): Payer: Medicare HMO

## 2013-12-09 DIAGNOSIS — C9000 Multiple myeloma not having achieved remission: Secondary | ICD-10-CM

## 2013-12-09 LAB — COMPREHENSIVE METABOLIC PANEL (CC13)
ALT: 9 U/L (ref 0–55)
AST: 13 U/L (ref 5–34)
Albumin: 3.4 g/dL — ABNORMAL LOW (ref 3.5–5.0)
Alkaline Phosphatase: 44 U/L (ref 40–150)
Anion Gap: 9 mEq/L (ref 3–11)
BUN: 14.1 mg/dL (ref 7.0–26.0)
CO2: 26 mEq/L (ref 22–29)
Calcium: 8.9 mg/dL (ref 8.4–10.4)
Chloride: 105 mEq/L (ref 98–109)
Creatinine: 0.8 mg/dL (ref 0.6–1.1)
Glucose: 96 mg/dl (ref 70–140)
Potassium: 3.5 mEq/L (ref 3.5–5.1)
Sodium: 140 mEq/L (ref 136–145)
Total Bilirubin: 0.2 mg/dL (ref 0.20–1.20)
Total Protein: 8 g/dL (ref 6.4–8.3)

## 2013-12-09 LAB — CBC WITH DIFFERENTIAL/PLATELET
BASO%: 0.2 % (ref 0.0–2.0)
Basophils Absolute: 0 10*3/uL (ref 0.0–0.1)
EOS%: 0 % (ref 0.0–7.0)
Eosinophils Absolute: 0 10*3/uL (ref 0.0–0.5)
HCT: 29.4 % — ABNORMAL LOW (ref 34.8–46.6)
HGB: 9.7 g/dL — ABNORMAL LOW (ref 11.6–15.9)
LYMPH%: 15.7 % (ref 14.0–49.7)
MCH: 33.2 pg (ref 25.1–34.0)
MCHC: 33 g/dL (ref 31.5–36.0)
MCV: 100.7 fL (ref 79.5–101.0)
MONO#: 2.2 10*3/uL — ABNORMAL HIGH (ref 0.1–0.9)
MONO%: 17.4 % — ABNORMAL HIGH (ref 0.0–14.0)
NEUT#: 8.6 10*3/uL — ABNORMAL HIGH (ref 1.5–6.5)
NEUT%: 66.7 % (ref 38.4–76.8)
Platelets: 46 10*3/uL — ABNORMAL LOW (ref 145–400)
RBC: 2.92 10*6/uL — ABNORMAL LOW (ref 3.70–5.45)
RDW: 16.1 % — ABNORMAL HIGH (ref 11.2–14.5)
WBC: 12.9 10*3/uL — ABNORMAL HIGH (ref 3.9–10.3)
lymph#: 2 10*3/uL (ref 0.9–3.3)
nRBC: 0 % (ref 0–0)

## 2013-12-09 LAB — TECHNOLOGIST REVIEW

## 2013-12-09 NOTE — Patient Instructions (Addendum)
Thrombocytopenia Thrombocytopenia means there are not enough platelets in your blood. Platelets are tiny cells in your blood. When you start bleeding, platelets clump together around the cut or injury to stop the bleeding. This process is called blood clotting. Not having enough platelets can cause bleeding problems. HOME CARE  Check your skin and inside your mouth for bruises or blood as told by your doctor.  Check your spit (sputum), pee (urine), and poop (stool) for blood as told by your doctor.  Do not do activities that can cause bumps or bruises until your doctor says it is okay.  Be careful not to cut yourself when you shave or use scissors, needles, knives, or other tools.  Be careful not to burn yourself when you iron or cook.  Ask your doctor if you can drink alcohol.  Only take medicines as told by your doctor.  Tell all your doctors and your dentist that you have this bleeding problem. GET HELP RIGHT AWAY IF:  You are bleeding anywhere on your body.  You are bleeding or have bruises without knowing why.  You have blood in your spit, pee, or poop. MAKE SURE YOU:  Understand these instructions.  Will watch your condition.  Will get help right away if you are not doing well or get worse. Document Released: 06/01/2011 Document Revised: 09/04/2011 Document Reviewed: 06/01/2011 [employer] Patient Information 2014 [employer].  

## 2013-12-09 NOTE — Progress Notes (Signed)
Noted pt platelets = 46. Per Dr. Alvy Bimler- hold treatment today and Friday. Pt to return as scheduled on June 30th. Information given to patient re: bleeding precautions. Pt verbalized understanding plan.

## 2013-12-10 ENCOUNTER — Telehealth: Payer: Self-pay | Admitting: Hematology and Oncology

## 2013-12-10 ENCOUNTER — Telehealth: Payer: Self-pay | Admitting: *Deleted

## 2013-12-10 NOTE — Telephone Encounter (Signed)
Informed pt to hold aspirin until next lab and she may drink alcohol in moderation.  Pt verbalized understanding.

## 2013-12-10 NOTE — Telephone Encounter (Signed)
Pt states her chemo was held yesterday due to low Platelet count.  She asks if she is supposed to continue taking Baby aspirin when her platelets are low?  She also wants to know if she can have alcohol?  She would like to have "one drink" this weekend.

## 2013-12-10 NOTE — Telephone Encounter (Signed)
Talked to pt and she is aware of appt on 6/30 and july 2015

## 2013-12-10 NOTE — Telephone Encounter (Signed)
Alcohol OK, hold aspirin until next recheck

## 2013-12-12 ENCOUNTER — Telehealth: Payer: Self-pay | Admitting: *Deleted

## 2013-12-12 ENCOUNTER — Ambulatory Visit: Payer: Medicare HMO

## 2013-12-12 NOTE — Telephone Encounter (Signed)
Asking if OK to help with children's craft activity tomorrow from 1000-1130 outside. She will be able to sit and not handle anything that could cause puncture/cut. There will not be shade. Instructed her to push fluids, sit and use sunscreen-apply halfway through activity. Listen to her body and go inside if she feels weak. She understands and agrees.

## 2013-12-23 ENCOUNTER — Ambulatory Visit (HOSPITAL_BASED_OUTPATIENT_CLINIC_OR_DEPARTMENT_OTHER): Payer: Medicare HMO | Admitting: Hematology and Oncology

## 2013-12-23 ENCOUNTER — Telehealth: Payer: Self-pay | Admitting: Hematology and Oncology

## 2013-12-23 ENCOUNTER — Other Ambulatory Visit (HOSPITAL_BASED_OUTPATIENT_CLINIC_OR_DEPARTMENT_OTHER): Payer: Medicare HMO

## 2013-12-23 ENCOUNTER — Ambulatory Visit (HOSPITAL_BASED_OUTPATIENT_CLINIC_OR_DEPARTMENT_OTHER): Payer: Medicare HMO

## 2013-12-23 ENCOUNTER — Encounter: Payer: Self-pay | Admitting: Hematology and Oncology

## 2013-12-23 VITALS — BP 138/64 | HR 74 | Temp 97.9°F | Resp 18 | Ht 63.0 in | Wt 144.1 lb

## 2013-12-23 DIAGNOSIS — F419 Anxiety disorder, unspecified: Secondary | ICD-10-CM

## 2013-12-23 DIAGNOSIS — C9000 Multiple myeloma not having achieved remission: Secondary | ICD-10-CM

## 2013-12-23 DIAGNOSIS — D696 Thrombocytopenia, unspecified: Secondary | ICD-10-CM

## 2013-12-23 DIAGNOSIS — M899 Disorder of bone, unspecified: Secondary | ICD-10-CM

## 2013-12-23 DIAGNOSIS — G4700 Insomnia, unspecified: Secondary | ICD-10-CM

## 2013-12-23 DIAGNOSIS — M8580 Other specified disorders of bone density and structure, unspecified site: Secondary | ICD-10-CM

## 2013-12-23 DIAGNOSIS — Z5112 Encounter for antineoplastic immunotherapy: Secondary | ICD-10-CM

## 2013-12-23 DIAGNOSIS — D630 Anemia in neoplastic disease: Secondary | ICD-10-CM

## 2013-12-23 DIAGNOSIS — M949 Disorder of cartilage, unspecified: Secondary | ICD-10-CM

## 2013-12-23 LAB — COMPREHENSIVE METABOLIC PANEL (CC13)
ALT: 13 U/L (ref 0–55)
AST: 14 U/L (ref 5–34)
Albumin: 3.7 g/dL (ref 3.5–5.0)
Alkaline Phosphatase: 42 U/L (ref 40–150)
Anion Gap: 11 mEq/L (ref 3–11)
BUN: 17.3 mg/dL (ref 7.0–26.0)
CO2: 25 mEq/L (ref 22–29)
Calcium: 9.7 mg/dL (ref 8.4–10.4)
Chloride: 103 mEq/L (ref 98–109)
Creatinine: 0.8 mg/dL (ref 0.6–1.1)
Glucose: 101 mg/dl (ref 70–140)
Potassium: 4 mEq/L (ref 3.5–5.1)
Sodium: 139 mEq/L (ref 136–145)
Total Bilirubin: 0.33 mg/dL (ref 0.20–1.20)
Total Protein: 8.9 g/dL — ABNORMAL HIGH (ref 6.4–8.3)

## 2013-12-23 LAB — CBC WITH DIFFERENTIAL/PLATELET
BASO%: 0.1 % (ref 0.0–2.0)
Basophils Absolute: 0 10*3/uL (ref 0.0–0.1)
EOS%: 0 % (ref 0.0–7.0)
Eosinophils Absolute: 0 10*3/uL (ref 0.0–0.5)
HCT: 30.9 % — ABNORMAL LOW (ref 34.8–46.6)
HGB: 10.2 g/dL — ABNORMAL LOW (ref 11.6–15.9)
LYMPH%: 11.3 % — ABNORMAL LOW (ref 14.0–49.7)
MCH: 33.1 pg (ref 25.1–34.0)
MCHC: 33 g/dL (ref 31.5–36.0)
MCV: 100.2 fL (ref 79.5–101.0)
MONO#: 2.8 10*3/uL — ABNORMAL HIGH (ref 0.1–0.9)
MONO%: 17.4 % — ABNORMAL HIGH (ref 0.0–14.0)
NEUT#: 11.3 10*3/uL — ABNORMAL HIGH (ref 1.5–6.5)
NEUT%: 71.2 % (ref 38.4–76.8)
Platelets: 120 10*3/uL — ABNORMAL LOW (ref 145–400)
RBC: 3.08 10*6/uL — ABNORMAL LOW (ref 3.70–5.45)
RDW: 16.2 % — ABNORMAL HIGH (ref 11.2–14.5)
WBC: 15.9 10*3/uL — ABNORMAL HIGH (ref 3.9–10.3)
lymph#: 1.8 10*3/uL (ref 0.9–3.3)

## 2013-12-23 MED ORDER — SODIUM CHLORIDE 0.9 % IV SOLN
INTRAVENOUS | Status: DC
  Administered 2013-12-23: 13:00:00 via INTRAVENOUS

## 2013-12-23 MED ORDER — ONDANSETRON HCL 8 MG PO TABS
ORAL_TABLET | ORAL | Status: AC
  Filled 2013-12-23: qty 1

## 2013-12-23 MED ORDER — BORTEZOMIB CHEMO SQ INJECTION 3.5 MG (2.5MG/ML)
1.0000 mg/m2 | Freq: Once | INTRAMUSCULAR | Status: AC
  Administered 2013-12-23: 1.75 mg via SUBCUTANEOUS
  Filled 2013-12-23: qty 1.75

## 2013-12-23 MED ORDER — ZOLEDRONIC ACID 4 MG/100ML IV SOLN
4.0000 mg | Freq: Once | INTRAVENOUS | Status: AC
Start: 2013-12-23 — End: 2013-12-23
  Administered 2013-12-23: 4 mg via INTRAVENOUS
  Filled 2013-12-23: qty 100

## 2013-12-23 MED ORDER — ONDANSETRON HCL 8 MG PO TABS
8.0000 mg | ORAL_TABLET | Freq: Once | ORAL | Status: AC
  Administered 2013-12-23: 8 mg via ORAL

## 2013-12-23 MED ORDER — MIRTAZAPINE 30 MG PO TABS: 30.0000 mg | ORAL_TABLET | Freq: Every day | ORAL | Status: DC

## 2013-12-23 NOTE — Patient Instructions (Signed)
[facility] Discharge Instructions for Patients Receiving Chemotherapy  Today you received the following chemotherapy agents: velcade  To help prevent nausea and vomiting after your treatment, we encourage you to take your nausea medication.  Take it as often as prescribed.     If you develop nausea and vomiting that is not controlled by your nausea medication, call the clinic. If it is after clinic hours your family physician or the after hours number for the clinic or go to the Emergency Department.   BELOW ARE SYMPTOMS THAT SHOULD BE REPORTED IMMEDIATELY:  *FEVER GREATER THAN 100.5 F  *CHILLS WITH OR WITHOUT FEVER  NAUSEA AND VOMITING THAT IS NOT CONTROLLED WITH YOUR NAUSEA MEDICATION  *UNUSUAL SHORTNESS OF BREATH  *UNUSUAL BRUISING OR BLEEDING  TENDERNESS IN MOUTH AND THROAT WITH OR WITHOUT PRESENCE OF ULCERS  *URINARY PROBLEMS  *BOWEL PROBLEMS  UNUSUAL RASH Items with * indicate a potential emergency and should be followed up as soon as possible.  Feel free to call the clinic you have any questions or concerns. The clinic phone number is [phone].   I have been informed and understand all the instructions given to me. I know to contact the clinic, my physician, or go to the Emergency Department if any problems should occur. I do not have any questions at this time, but understand that I may call the clinic during office hours   should I have any questions or need assistance in obtaining follow up care.    __________________________________________  _____________  __________ Signature of Patient or Authorized Representative            Date                   Time    __________________________________________ Nurse's Signature   Zoledronic Acid injection (Hypercalcemia, Oncology) What is this medicine? ZOLEDRONIC ACID (ZOE le dron ik AS id) lowers the amount of calcium loss from bone. It is used to treat too much calcium in your blood from  cancer. It is also used to prevent complications of cancer that has spread to the bone. This medicine may be used for other purposes; ask your health care provider or pharmacist if you have questions. COMMON BRAND NAME(S): Zometa What should I tell my health care provider before I take this medicine? They need to know if you have any of these conditions: -aspirin-sensitive asthma -cancer, especially if you are receiving medicines used to treat cancer -dental disease or wear dentures -infection -kidney disease -receiving corticosteroids like dexamethasone or prednisone -an unusual or allergic reaction to zoledronic acid, other medicines, foods, dyes, or preservatives -pregnant or trying to get pregnant -breast-feeding How should I use this medicine? This medicine is for infusion into a vein. It is given by a health care professional in a hospital or clinic setting. Talk to your pediatrician regarding the use of this medicine in children. Special care may be needed. Overdosage: If you think you have taken too much of this medicine contact a poison control center or emergency room at once. NOTE: This medicine is only for you. Do not share this medicine with others. What if I miss a dose? It is important not to miss your dose. Call your doctor or health care professional if you are unable to keep an appointment. What may interact with this medicine? -certain antibiotics given by injection -NSAIDs, medicines for pain and inflammation, like ibuprofen or naproxen -some diuretics like bumetanide, furosemide -teriparatide -thalidomide This list may  not describe all possible interactions. Give your health care provider a list of all the medicines, herbs, non-prescription drugs, or dietary supplements you use. Also tell them if you smoke, drink alcohol, or use illegal drugs. Some items may interact with your medicine. What should I watch for while using this medicine? Visit your doctor or health  care professional for regular checkups. It may be some time before you see the benefit from this medicine. Do not stop taking your medicine unless your doctor tells you to. Your doctor may order blood tests or other tests to see how you are doing. Women should inform their doctor if they wish to become pregnant or think they might be pregnant. There is a potential for serious side effects to an unborn child. Talk to your health care professional or pharmacist for more information. You should make sure that you get enough calcium and vitamin D while you are taking this medicine. Discuss the foods you eat and the vitamins you take with your health care professional. Some people who take this medicine have severe bone, joint, and/or muscle pain. This medicine may also increase your risk for jaw problems or a broken thigh bone. Tell your doctor right away if you have severe pain in your jaw, bones, joints, or muscles. Tell your doctor if you have any pain that does not go away or that gets worse. Tell your dentist and dental surgeon that you are taking this medicine. You should not have major dental surgery while on this medicine. See your dentist to have a dental exam and fix any dental problems before starting this medicine. Take good care of your teeth while on this medicine. Make sure you see your dentist for regular follow-up appointments. What side effects may I notice from receiving this medicine? Side effects that you should report to your doctor or health care professional as soon as possible: -allergic reactions like skin rash, itching or hives, swelling of the face, lips, or tongue -anxiety, confusion, or depression -breathing problems -changes in vision -eye pain -feeling faint or lightheaded, falls -jaw pain, especially after dental work -mouth sores -muscle cramps, stiffness, or weakness -trouble passing urine or change in the amount of urine Side effects that usually do not require  medical attention (report to your doctor or health care professional if they continue or are bothersome): -bone, joint, or muscle pain -constipation -diarrhea -fever -hair loss -irritation at site where injected -loss of appetite -nausea, vomiting -stomach upset -trouble sleeping -trouble swallowing -weak or tired This list may not describe all possible side effects. Call your doctor for medical advice about side effects. You may report side effects to FDA at 1-800-FDA-1088. Where should I keep my medicine? This drug is given in a hospital or clinic and will not be stored at home. NOTE: This sheet is a summary. It may not cover all possible information. If you have questions about this medicine, talk to your doctor, pharmacist, or health care provider.  2015, [employer]vier/[employer] Standard. (2012-11-21 13:03:13)

## 2013-12-23 NOTE — Telephone Encounter (Signed)
gv and printed appt sched and avs for pt for July....sed added tx. °

## 2013-12-23 NOTE — Assessment & Plan Note (Signed)
This is likely due to recent treatment. The patient denies recent history of bleeding such as epistaxis, hematuria or hematochezia. She is asymptomatic from the anemia. I will observe for now.  She does not require transfusion now. I will continue the chemotherapy at reduced dose as above.  If the anemia gets progressive worse in the future, I might have to delay her treatment or adjust the chemotherapy dose.

## 2013-12-23 NOTE — Progress Notes (Signed)
[facility] OFFICE PROGRESS NOTE  Patient Care Team: Jinny Sanders, MD as PCP - General (Family Medicine) [name], MD as Referring Physician (Hematology and Oncology)  SUMMARY OF ONCOLOGIC HISTORY: Oncology History   Multiple myeloma, Ig A Lambda, M spike 3.54 grams, Calcium 9.2, Creatinine 0.8, Beta 2 microglobulin 4.52, IgA 4840 mg/dL, lambda light chain 75.4, albumin 3.6, hemoglobin 9.7, platelet 115    Primary site: Multiple Myeloma   Staging method: AJCC 6th Edition   Clinical: Stage IIA signed by [name], MD on 11/07/2013  2:46 PM   Summary: Stage IIA        Multiple myeloma, without mention of having achieved remission   10/31/2013 Bone Marrow Biopsy Bone marrow biopsy confirmed multiple myeloma with 40% bone marrow involvement. Skeletal survey showed minimal lesions in her score with generalized demineralization   11/10/2013 -  Chemotherapy The patient is started on induction chemotherapy with weekly dexamethasone 40 mg by mouth as well as Velcade subcutaneous injection on days 1, 4, 8 and 11. On 11/21/2013, she was started on monthly Zometa.    INTERVAL HISTORY: Please see below for problem oriented charting. She had recent easy bruising from multiple bites. She tolerated chemotherapy well apart from severe thrombocytopenia. The patient denies any recent signs or symptoms of bleeding such as spontaneous epistaxis, hematuria or hematochezia. She denies any recent fever, chills, night sweats or abnormal weight loss She complained of insomnia. She is concerned about bone marrow transplant. Denies peripheral neuropathy.  REVIEW OF SYSTEMS:   Constitutional: Denies fevers, chills or abnormal weight loss Eyes: Denies blurriness of vision Ears, nose, mouth, throat, and face: Denies mucositis or sore throat Respiratory: Denies cough, dyspnea or wheezes Cardiovascular: Denies palpitation, chest discomfort or lower extremity swelling Gastrointestinal:   Denies nausea, heartburn or change in bowel habits Skin: Denies abnormal skin rashes Lymphatics: Denies new lymphadenopathy  Neurological:Denies numbness, tingling or new weaknesses Behavioral/Psych: Mood is stable, no new changes  All other systems were reviewed with the patient and are negative.  I have reviewed the past medical history, past surgical history, social history and family history with the patient and they are unchanged from previous note.  ALLERGIES:  is allergic to penicillins and sulfa antibiotics.  MEDICATIONS:  Current Outpatient Prescriptions  Medication Sig Dispense Refill  . acyclovir (ZOVIRAX) 400 MG tablet Take 1 tablet (400 mg total) by mouth 2 (two) times daily.  60 tablet  3  . ALPRAZolam (XANAX) 0.5 MG tablet Take 1 tablet (0.5 mg total) by mouth 2 (two) times daily as needed for anxiety.  30 tablet  0  . carvedilol (COREG) 6.25 MG tablet Take 0.5 tablets (3.125 mg total) by mouth 2 (two) times daily with a meal.  90 tablet  3  . cholecalciferol (VITAMIN D) 1000 UNITS tablet Take 1,000 Units by mouth daily.      Marland Kitchen dexamethasone (DECADRON) 4 MG tablet Take 10 tablets (40 mg total) by mouth once a week.  100 tablet  1  . docusate sodium (COLACE) 100 MG capsule Take 100 mg by mouth 2 (two) times daily.      . mirtazapine (REMERON) 15 MG tablet TAKE 1 TABLET BY MOUTH AT BEDTIME  30 tablet  0  . Multiple Vitamins-Minerals (CENTRUM SILVER ULTRA WOMENS PO) Take by mouth daily.      . Omeprazole (PRILOSEC PO) Take by mouth as needed.      . ondansetron (ZOFRAN) 8 MG tablet Take 1 tablet (8 mg  total) by mouth 2 (two) times daily. Start the day after chemo for 2 days. Then as needed for nausea or vomiting.  30 tablet  1  . Probiotic Product (ALIGN) 4 MG CAPS Take 1 capsule by mouth daily.      Marland Kitchen FIBER PO Take by mouth as needed.      . mirtazapine (REMERON) 30 MG tablet Take 1 tablet (30 mg total) by mouth at bedtime.  60 tablet  1   No current facility-administered  medications for this visit.    PHYSICAL EXAMINATION: ECOG PERFORMANCE STATUS: 1 - Symptomatic but completely ambulatory  Filed Vitals:   12/23/13 1051  BP: 138/64  Pulse: 74  Temp: 97.9 F (36.6 C)  Resp: 18   Filed Weights   12/23/13 1051  Weight: 144 lb 1.6 oz (65.363 kg)    GENERAL:alert, no distress and comfortable SKIN: skin color, texture, turgor are normal, no rashes or significant lesions. Multiple skin bruises are noted EYES: normal, Conjunctiva are pink and non-injected, sclera clear OROPHARYNX:no exudate, no erythema and lips, buccal mucosa, and tongue normal  NECK: supple, thyroid normal size, non-tender, without nodularity LYMPH:  no palpable lymphadenopathy in the cervical, axillary or inguinal LUNGS: clear to auscultation and percussion with normal breathing effort HEART: regular rate & rhythm and no murmurs and no lower extremity edema ABDOMEN:abdomen soft, non-tender and normal bowel sounds Musculoskeletal:no cyanosis of digits and no clubbing  NEURO: alert & oriented x 3 with fluent speech, no focal motor/sensory deficits  LABORATORY DATA:  I have reviewed the data as listed    Component Value Date/Time   NA 139 12/23/2013 1039   NA 138 10/28/2013 0955   K 4.0 12/23/2013 1039   K 3.9 10/28/2013 0955   CL 99 10/28/2013 0955   CO2 25 12/23/2013 1039   CO2 28 10/28/2013 0955   GLUCOSE 101 12/23/2013 1039   GLUCOSE 100* 10/28/2013 0955   BUN 17.3 12/23/2013 1039   BUN 11 10/28/2013 0955   CREATININE 0.8 12/23/2013 1039   CREATININE 0.8 10/28/2013 0955   CALCIUM 9.7 12/23/2013 1039   CALCIUM 9.3 10/28/2013 0955   PROT 8.9* 12/23/2013 1039   PROT 9.5* 08/08/2013 1127   ALBUMIN 3.7 12/23/2013 1039   ALBUMIN 3.7 08/08/2013 1127   AST 14 12/23/2013 1039   AST 19 08/08/2013 1127   ALT 13 12/23/2013 1039   ALT 19 08/08/2013 1127   ALKPHOS 42 12/23/2013 1039   ALKPHOS 41 08/08/2013 1127   BILITOT 0.33 12/23/2013 1039   BILITOT 0.6 08/08/2013 1127    No results found for this  basename: SPEP, UPEP,  kappa and lambda light chains    Lab Results  Component Value Date   WBC 15.9* 12/23/2013   NEUTROABS 11.3* 12/23/2013   HGB 10.2* 12/23/2013   HCT 30.9* 12/23/2013   MCV 100.2 12/23/2013   PLT 120* 12/23/2013      Chemistry      Component Value Date/Time   NA 139 12/23/2013 1039   NA 138 10/28/2013 0955   K 4.0 12/23/2013 1039   K 3.9 10/28/2013 0955   CL 99 10/28/2013 0955   CO2 25 12/23/2013 1039   CO2 28 10/28/2013 0955   BUN 17.3 12/23/2013 1039   BUN 11 10/28/2013 0955   CREATININE 0.8 12/23/2013 1039   CREATININE 0.8 10/28/2013 0955      Component Value Date/Time   CALCIUM 9.7 12/23/2013 1039   CALCIUM 9.3 10/28/2013 0955   ALKPHOS 42 12/23/2013 1039  ALKPHOS 41 08/08/2013 1127   AST 14 12/23/2013 1039   AST 19 08/08/2013 1127   ALT 13 12/23/2013 1039   ALT 19 08/08/2013 1127   BILITOT 0.33 12/23/2013 1039   BILITOT 0.6 08/08/2013 1127     ASSESSMENT & PLAN:  Multiple myeloma, without mention of having achieved remission She tolerated treatment well apart from recent severe thrombocytopenia. I will proceed to modify the dose of Velcade injection. She is contemplating am wondering about the role of stem cell transplant. Due to the lack of supportive care at home, I would not recommend her to proceed until definitive plan is in place to get caregivers around to take care of her after the stem cell transplant.  Thrombocytopenia This is likely due to recent treatment. The patient denies recent history of bleeding such as epistaxis, hematuria or hematochezia. She is asymptomatic from the low platelet count. I will observe for now.  she does not require transfusion now. I will modify the dose of Velcade. If the thrombocytopenia gets progressive worse in the future, I might have to delay her treatment.    Osteopenia She will continue calcium with vitamin D.  Insomnia I recommend she takes Remeron regularly. I will proceed to increase the dose of Remeron.  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 The patient denies recent history of bleeding such as epistaxis, hematuria or hematochezia. She is asymptomatic from the anemia. I will observe for now.  She does not require transfusion now. I will continue the chemotherapy at reduced dose as above.  If the anemia gets progressive worse in the future, I might have to delay her treatment or adjust the chemotherapy dose.    No orders of the defined types were placed in this encounter.   All questions were answered. The patient knows to call the clinic with any problems, questions or concerns. No barriers to learning was detected. I spent 40 minutes counseling the patient face to face. The total time spent in the appointment was 55 minutes and more than 50% was on counseling and review of test results     [facility], Princess Karnes, MD 12/23/2013 12:17 PM

## 2013-12-23 NOTE — Assessment & Plan Note (Signed)
This is likely due to recent treatment. The patient denies recent history of bleeding such as epistaxis, hematuria or hematochezia. She is asymptomatic from the low platelet count. I will observe for now.  she does not require transfusion now. I will modify the dose of Velcade. If the thrombocytopenia gets progressive worse in the future, I might have to delay her treatment.

## 2013-12-23 NOTE — Assessment & Plan Note (Signed)
She will continue calcium with vitamin D 

## 2013-12-23 NOTE — Assessment & Plan Note (Signed)
I recommend she takes Remeron regularly. I will proceed to increase the dose of Remeron.

## 2013-12-23 NOTE — Assessment & Plan Note (Signed)
She tolerated treatment well apart from recent severe thrombocytopenia. I will proceed to modify the dose of Velcade injection. She is contemplating am wondering about the role of stem cell transplant. Due to the lack of supportive care at home, I would not recommend her to proceed until definitive plan is in place to get caregivers around to take care of her after the stem cell transplant.

## 2013-12-24 ENCOUNTER — Ambulatory Visit: Payer: Medicare HMO | Admitting: Hematology and Oncology

## 2013-12-24 ENCOUNTER — Encounter: Payer: Self-pay | Admitting: Hematology and Oncology

## 2013-12-25 LAB — SPEP & IFE WITH QIG
Albumin ELP: 44.7 % — ABNORMAL LOW (ref 55.8–66.1)
Alpha-1-Globulin: 4.5 % (ref 2.9–4.9)
Alpha-2-Globulin: 5.9 % — ABNORMAL LOW (ref 7.1–11.8)
Beta 2: 35.3 % — ABNORMAL HIGH (ref 3.2–6.5)
Beta Globulin: 6.4 % (ref 4.7–7.2)
Gamma Globulin: 3.2 % — ABNORMAL LOW (ref 11.1–18.8)
IgA: 3390 mg/dL — ABNORMAL HIGH (ref 69–380)
IgG (Immunoglobin G), Serum: 275 mg/dL — ABNORMAL LOW (ref 690–1700)
IgM, Serum: 10 mg/dL — ABNORMAL LOW (ref 52–322)
M-Spike, %: 2.82 g/dL
Total Protein, Serum Electrophoresis: 8.7 g/dL — ABNORMAL HIGH (ref 6.0–8.3)

## 2013-12-25 LAB — KAPPA/LAMBDA LIGHT CHAINS
Kappa free light chain: 0.16 mg/dL — ABNORMAL LOW (ref 0.33–1.94)
Kappa:Lambda Ratio: 0 — ABNORMAL LOW (ref 0.26–1.65)
Lambda Free Lght Chn: 55.4 mg/dL — ABNORMAL HIGH (ref 0.57–2.63)

## 2013-12-25 LAB — BETA 2 MICROGLOBULIN, SERUM: Beta-2 Microglobulin: 4.59 mg/L — ABNORMAL HIGH (ref <=2.51)

## 2013-12-29 ENCOUNTER — Telehealth: Payer: Self-pay | Admitting: *Deleted

## 2013-12-29 NOTE — Telephone Encounter (Signed)
Pt requesting to know how her "Protein tests" results turned out from last week?

## 2013-12-29 NOTE — Telephone Encounter (Signed)
pls send her a copy. Slightly improved M spike

## 2013-12-29 NOTE — Telephone Encounter (Signed)
Informed pt of slightly improved M spike and will leave copy of other labs at front desk for her to pick up when she comes in for appts tomorrow.  Pt verbalized understanding.

## 2013-12-29 NOTE — Telephone Encounter (Signed)
Pt reports one mouth sore.  Asks if ok to use Peridex as prescribed by dentist?  Instructed ok to use Peridex and may also try using warm salt water rinses three to four times daily.  Let us know if more mouth sores develop or if the one you have gets worse.  Pt verbalized understanding.

## 2013-12-30 ENCOUNTER — Ambulatory Visit (HOSPITAL_BASED_OUTPATIENT_CLINIC_OR_DEPARTMENT_OTHER): Payer: Medicare HMO

## 2013-12-30 ENCOUNTER — Other Ambulatory Visit (HOSPITAL_BASED_OUTPATIENT_CLINIC_OR_DEPARTMENT_OTHER): Payer: Medicare HMO

## 2013-12-30 ENCOUNTER — Other Ambulatory Visit: Payer: Self-pay | Admitting: Hematology and Oncology

## 2013-12-30 VITALS — BP 137/71 | HR 70 | Temp 98.7°F | Resp 18

## 2013-12-30 DIAGNOSIS — C9000 Multiple myeloma not having achieved remission: Secondary | ICD-10-CM

## 2013-12-30 DIAGNOSIS — Z5112 Encounter for antineoplastic immunotherapy: Secondary | ICD-10-CM

## 2013-12-30 LAB — COMPREHENSIVE METABOLIC PANEL (CC13)
ALT: 12 U/L (ref 0–55)
AST: 13 U/L (ref 5–34)
Albumin: 3.5 g/dL (ref 3.5–5.0)
Alkaline Phosphatase: 45 U/L (ref 40–150)
Anion Gap: 10 mEq/L (ref 3–11)
BUN: 19.1 mg/dL (ref 7.0–26.0)
CO2: 27 mEq/L (ref 22–29)
Calcium: 9.6 mg/dL (ref 8.4–10.4)
Chloride: 103 mEq/L (ref 98–109)
Creatinine: 0.8 mg/dL (ref 0.6–1.1)
Glucose: 90 mg/dl (ref 70–140)
Potassium: 4 mEq/L (ref 3.5–5.1)
Sodium: 140 mEq/L (ref 136–145)
Total Bilirubin: 0.26 mg/dL (ref 0.20–1.20)
Total Protein: 8.7 g/dL — ABNORMAL HIGH (ref 6.4–8.3)

## 2013-12-30 LAB — TECHNOLOGIST REVIEW

## 2013-12-30 LAB — CBC WITH DIFFERENTIAL/PLATELET
BASO%: 0.1 % (ref 0.0–2.0)
Basophils Absolute: 0 10*3/uL (ref 0.0–0.1)
EOS%: 0 % (ref 0.0–7.0)
Eosinophils Absolute: 0 10*3/uL (ref 0.0–0.5)
HCT: 29.8 % — ABNORMAL LOW (ref 34.8–46.6)
HGB: 9.7 g/dL — ABNORMAL LOW (ref 11.6–15.9)
LYMPH%: 12.8 % — ABNORMAL LOW (ref 14.0–49.7)
MCH: 32.9 pg (ref 25.1–34.0)
MCHC: 32.6 g/dL (ref 31.5–36.0)
MCV: 101 fL (ref 79.5–101.0)
MONO#: 2.5 10*3/uL — ABNORMAL HIGH (ref 0.1–0.9)
MONO%: 16.1 % — ABNORMAL HIGH (ref 0.0–14.0)
NEUT#: 11.1 10*3/uL — ABNORMAL HIGH (ref 1.5–6.5)
NEUT%: 71 % (ref 38.4–76.8)
Platelets: 109 10*3/uL — ABNORMAL LOW (ref 145–400)
RBC: 2.95 10*6/uL — ABNORMAL LOW (ref 3.70–5.45)
RDW: 15.9 % — ABNORMAL HIGH (ref 11.2–14.5)
WBC: 15.6 10*3/uL — ABNORMAL HIGH (ref 3.9–10.3)
lymph#: 2 10*3/uL (ref 0.9–3.3)
nRBC: 0 % (ref 0–0)

## 2013-12-30 MED ORDER — ONDANSETRON HCL 8 MG PO TABS
ORAL_TABLET | ORAL | Status: AC
  Filled 2013-12-30: qty 1

## 2013-12-30 MED ORDER — BORTEZOMIB CHEMO SQ INJECTION 3.5 MG (2.5MG/ML)
1.0000 mg/m2 | Freq: Once | INTRAMUSCULAR | Status: AC
  Administered 2013-12-30: 1.75 mg via SUBCUTANEOUS
  Filled 2013-12-30: qty 1.75

## 2013-12-30 MED ORDER — ONDANSETRON HCL 8 MG PO TABS
8.0000 mg | ORAL_TABLET | Freq: Once | ORAL | Status: AC
  Administered 2013-12-30: 8 mg via ORAL

## 2013-12-30 NOTE — Patient Instructions (Signed)
[facility] Discharge Instructions for Patients Receiving Chemotherapy  Today you received the following chemotherapy agents Velcade.   To help prevent nausea and vomiting after your treatment, we encourage you to take your nausea medication ad directed.    If you develop nausea and vomiting that is not controlled by your nausea medication, call the clinic.   BELOW ARE SYMPTOMS THAT SHOULD BE REPORTED IMMEDIATELY:  *FEVER GREATER THAN 100.5 F  *CHILLS WITH OR WITHOUT FEVER  NAUSEA AND VOMITING THAT IS NOT CONTROLLED WITH YOUR NAUSEA MEDICATION  *UNUSUAL SHORTNESS OF BREATH  *UNUSUAL BRUISING OR BLEEDING  TENDERNESS IN MOUTH AND THROAT WITH OR WITHOUT PRESENCE OF ULCERS  *URINARY PROBLEMS  *BOWEL PROBLEMS  UNUSUAL RASH Items with * indicate a potential emergency and should be followed up as soon as possible.  Feel free to call the clinic you have any questions or concerns. The clinic phone number is [phone].

## 2014-01-02 ENCOUNTER — Ambulatory Visit (HOSPITAL_BASED_OUTPATIENT_CLINIC_OR_DEPARTMENT_OTHER): Payer: Medicare HMO

## 2014-01-02 VITALS — BP 141/60 | HR 90 | Temp 98.5°F

## 2014-01-02 DIAGNOSIS — C9000 Multiple myeloma not having achieved remission: Secondary | ICD-10-CM

## 2014-01-02 DIAGNOSIS — Z5112 Encounter for antineoplastic immunotherapy: Secondary | ICD-10-CM

## 2014-01-02 MED ORDER — ONDANSETRON HCL 8 MG PO TABS
8.0000 mg | ORAL_TABLET | Freq: Once | ORAL | Status: AC
  Administered 2014-01-02: 8 mg via ORAL

## 2014-01-02 MED ORDER — ONDANSETRON HCL 8 MG PO TABS
ORAL_TABLET | ORAL | Status: AC
  Filled 2014-01-02: qty 1

## 2014-01-02 MED ORDER — BORTEZOMIB CHEMO SQ INJECTION 3.5 MG (2.5MG/ML)
1.0000 mg/m2 | Freq: Once | INTRAMUSCULAR | Status: AC
  Administered 2014-01-02: 1.75 mg via SUBCUTANEOUS
  Filled 2014-01-02: qty 1.75

## 2014-01-02 NOTE — Patient Instructions (Signed)
[facility] Discharge Instructions for Patients Receiving Chemotherapy  Today you received the following chemotherapy agents: Velcade.  To help prevent nausea and vomiting after your treatment, we encourage you to take your nausea medication as prescribed.   If you develop nausea and vomiting that is not controlled by your nausea medication, call the clinic.   BELOW ARE SYMPTOMS THAT SHOULD BE REPORTED IMMEDIATELY:  *FEVER GREATER THAN 100.5 F  *CHILLS WITH OR WITHOUT FEVER  NAUSEA AND VOMITING THAT IS NOT CONTROLLED WITH YOUR NAUSEA MEDICATION  *UNUSUAL SHORTNESS OF BREATH  *UNUSUAL BRUISING OR BLEEDING  TENDERNESS IN MOUTH AND THROAT WITH OR WITHOUT PRESENCE OF ULCERS  *URINARY PROBLEMS  *BOWEL PROBLEMS  UNUSUAL RASH Items with * indicate a potential emergency and should be followed up as soon as possible.  Feel free to call the clinic you have any questions or concerns. The clinic phone number is [phone].    

## 2014-01-06 ENCOUNTER — Telehealth: Payer: Self-pay | Admitting: Cardiovascular Disease

## 2014-01-06 MED ORDER — CARVEDILOL 6.25 MG PO TABS: 3.1250 mg | ORAL_TABLET | Freq: Two times a day (BID) | ORAL | Status: DC

## 2014-01-06 NOTE — Telephone Encounter (Signed)
Patient needed a 90 tablet rx sent to pharmacy for refill  Refill sent  Patient aware

## 2014-01-06 NOTE — Telephone Encounter (Signed)
PT wanted to know if she can go on a 90 day supply for cardvol.

## 2014-01-07 ENCOUNTER — Telehealth: Payer: Self-pay | Admitting: Hematology and Oncology

## 2014-01-07 NOTE — Telephone Encounter (Signed)
Faxed pt medical records to [facility] [phone]

## 2014-01-13 ENCOUNTER — Other Ambulatory Visit (HOSPITAL_BASED_OUTPATIENT_CLINIC_OR_DEPARTMENT_OTHER): Payer: Medicare HMO

## 2014-01-13 ENCOUNTER — Ambulatory Visit (HOSPITAL_BASED_OUTPATIENT_CLINIC_OR_DEPARTMENT_OTHER): Payer: Medicare HMO | Admitting: Hematology and Oncology

## 2014-01-13 ENCOUNTER — Ambulatory Visit (HOSPITAL_BASED_OUTPATIENT_CLINIC_OR_DEPARTMENT_OTHER): Payer: Medicare HMO

## 2014-01-13 ENCOUNTER — Encounter: Payer: Self-pay | Admitting: Hematology and Oncology

## 2014-01-13 ENCOUNTER — Telehealth: Payer: Self-pay | Admitting: Hematology and Oncology

## 2014-01-13 VITALS — BP 123/53 | HR 81 | Temp 98.9°F | Resp 18 | Ht 63.0 in | Wt 147.5 lb

## 2014-01-13 DIAGNOSIS — C9000 Multiple myeloma not having achieved remission: Secondary | ICD-10-CM

## 2014-01-13 DIAGNOSIS — Z5112 Encounter for antineoplastic immunotherapy: Secondary | ICD-10-CM

## 2014-01-13 DIAGNOSIS — D696 Thrombocytopenia, unspecified: Secondary | ICD-10-CM

## 2014-01-13 DIAGNOSIS — M8580 Other specified disorders of bone density and structure, unspecified site: Secondary | ICD-10-CM

## 2014-01-13 DIAGNOSIS — R21 Rash and other nonspecific skin eruption: Secondary | ICD-10-CM

## 2014-01-13 DIAGNOSIS — M949 Disorder of cartilage, unspecified: Secondary | ICD-10-CM

## 2014-01-13 DIAGNOSIS — D630 Anemia in neoplastic disease: Secondary | ICD-10-CM

## 2014-01-13 DIAGNOSIS — M899 Disorder of bone, unspecified: Secondary | ICD-10-CM

## 2014-01-13 LAB — COMPREHENSIVE METABOLIC PANEL (CC13)
ALT: 11 U/L (ref 0–55)
AST: 12 U/L (ref 5–34)
Albumin: 3.4 g/dL — ABNORMAL LOW (ref 3.5–5.0)
Alkaline Phosphatase: 44 U/L (ref 40–150)
Anion Gap: 10 mEq/L (ref 3–11)
BUN: 12.9 mg/dL (ref 7.0–26.0)
CO2: 26 mEq/L (ref 22–29)
Calcium: 9.2 mg/dL (ref 8.4–10.4)
Chloride: 104 mEq/L (ref 98–109)
Creatinine: 0.9 mg/dL (ref 0.6–1.1)
Glucose: 111 mg/dl (ref 70–140)
Potassium: 3.8 mEq/L (ref 3.5–5.1)
Sodium: 140 mEq/L (ref 136–145)
Total Bilirubin: 0.26 mg/dL (ref 0.20–1.20)
Total Protein: 8.3 g/dL (ref 6.4–8.3)

## 2014-01-13 LAB — CBC WITH DIFFERENTIAL/PLATELET
BASO%: 0.1 % (ref 0.0–2.0)
Basophils Absolute: 0 10*3/uL (ref 0.0–0.1)
EOS%: 0 % (ref 0.0–7.0)
Eosinophils Absolute: 0 10*3/uL (ref 0.0–0.5)
HCT: 30 % — ABNORMAL LOW (ref 34.8–46.6)
HGB: 9.8 g/dL — ABNORMAL LOW (ref 11.6–15.9)
LYMPH%: 15.6 % (ref 14.0–49.7)
MCH: 33.1 pg (ref 25.1–34.0)
MCHC: 32.7 g/dL (ref 31.5–36.0)
MCV: 101.4 fL — ABNORMAL HIGH (ref 79.5–101.0)
MONO#: 2.1 10*3/uL — ABNORMAL HIGH (ref 0.1–0.9)
MONO%: 14.5 % — ABNORMAL HIGH (ref 0.0–14.0)
NEUT#: 10 10*3/uL — ABNORMAL HIGH (ref 1.5–6.5)
NEUT%: 69.8 % (ref 38.4–76.8)
Platelets: 109 10*3/uL — ABNORMAL LOW (ref 145–400)
RBC: 2.96 10*6/uL — ABNORMAL LOW (ref 3.70–5.45)
RDW: 15.8 % — ABNORMAL HIGH (ref 11.2–14.5)
WBC: 14.4 10*3/uL — ABNORMAL HIGH (ref 3.9–10.3)
lymph#: 2.3 10*3/uL (ref 0.9–3.3)
nRBC: 0 % (ref 0–0)

## 2014-01-13 MED ORDER — ONDANSETRON HCL 8 MG PO TABS
ORAL_TABLET | ORAL | Status: AC
  Filled 2014-01-13: qty 1

## 2014-01-13 MED ORDER — BORTEZOMIB CHEMO SQ INJECTION 3.5 MG (2.5MG/ML)
1.0000 mg/m2 | Freq: Once | INTRAMUSCULAR | Status: AC
  Administered 2014-01-13: 1.75 mg via SUBCUTANEOUS
  Filled 2014-01-13: qty 1.75

## 2014-01-13 MED ORDER — ONDANSETRON HCL 8 MG PO TABS
8.0000 mg | ORAL_TABLET | Freq: Once | ORAL | Status: AC
  Administered 2014-01-13: 8 mg via ORAL

## 2014-01-13 NOTE — Assessment & Plan Note (Signed)
This is minor, related to side effects of treatment. I recommended close observation.

## 2014-01-13 NOTE — Assessment & Plan Note (Signed)
She tolerated treatment well apart from recent severe thrombocytopenia. I will proceed with modified dose of Velcade injection. She is contemplating and wondering about the role of stem cell transplant. Due to the lack of supportive care at home, I would not recommend her to proceed until definitive plan is in place to get caregivers around to take care of her after the stem cell transplant. Her insurance company will not pay for her to get transplant at [facility]. I would have to refer her to another transplant center if she elects to proceed with bone marrow transplant.

## 2014-01-13 NOTE — Assessment & Plan Note (Signed)
This is likely due to recent treatment. The patient denies recent history of bleeding such as epistaxis, hematuria or hematochezia. She is asymptomatic from the low platelet count. I will observe for now.  she does not require transfusion now. I will modify the dose of Velcade. If the thrombocytopenia gets progressive worse in the future, I might have to delay her treatment.

## 2014-01-13 NOTE — Patient Instructions (Signed)
[facility] Discharge Instructions for Patients Receiving Chemotherapy  Today you received the following chemotherapy agents: Velcade.  To help prevent nausea and vomiting after your treatment, we encourage you to take your nausea medication as prescribed.   If you develop nausea and vomiting that is not controlled by your nausea medication, call the clinic.   BELOW ARE SYMPTOMS THAT SHOULD BE REPORTED IMMEDIATELY:  *FEVER GREATER THAN 100.5 F  *CHILLS WITH OR WITHOUT FEVER  NAUSEA AND VOMITING THAT IS NOT CONTROLLED WITH YOUR NAUSEA MEDICATION  *UNUSUAL SHORTNESS OF BREATH  *UNUSUAL BRUISING OR BLEEDING  TENDERNESS IN MOUTH AND THROAT WITH OR WITHOUT PRESENCE OF ULCERS  *URINARY PROBLEMS  *BOWEL PROBLEMS  UNUSUAL RASH Items with * indicate a potential emergency and should be followed up as soon as possible.  Feel free to call the clinic you have any questions or concerns. The clinic phone number is [phone].    

## 2014-01-13 NOTE — Telephone Encounter (Signed)
Pt confirmed labs/ov per 07/21 POF, gave pt AVS and sent msg to restart chemo.Marland Kitchen..KJ

## 2014-01-13 NOTE — Progress Notes (Signed)
[place] OFFICE PROGRESS NOTE  Patient Care Team: Jinny Sanders, MD as PCP - General (Family Medicine) [name], MD as Referring Physician (Hematology and Oncology)  SUMMARY OF ONCOLOGIC HISTORY: Oncology History   Multiple myeloma, Ig A Lambda, M spike 3.54 grams, Calcium 9.2, Creatinine 0.8, Beta 2 microglobulin 4.52, IgA 4840 mg/dL, lambda light chain 75.4, albumin 3.6, hemoglobin 9.7, platelet 115    Primary site: Multiple Myeloma   Staging method: AJCC 6th Edition   Clinical: Stage IIA signed by [name], MD on 11/07/2013  2:46 PM   Summary: Stage IIA        Multiple myeloma, without mention of having achieved remission   10/31/2013 Bone Marrow Biopsy Bone marrow biopsy confirmed multiple myeloma with 40% bone marrow involvement. Skeletal survey showed minimal lesions in her score with generalized demineralization   11/10/2013 -  Chemotherapy The patient is started on induction chemotherapy with weekly dexamethasone 40 mg by mouth as well as Velcade subcutaneous injection on days 1, 4, 8 and 11. On 11/21/2013, she was started on monthly Zometa.   12/23/2013 Adverse Reaction The dose of Velcade was reduced due to thrombocytopenia.    INTERVAL HISTORY: Please see below for problem oriented charting. She is seen prior to cycle 4 of treatment. Of note, she missed 3 weeks due to severe pancytopenia after 2 cycles of treatment. Overall, she is feeling well. Denies peripheral neuropathy. The patient denies any mouth sores, nausea, vomiting or change in bowel habits She denies new bone pain. REVIEW OF SYSTEMS:   Constitutional: Denies fevers, chills or abnormal weight loss Eyes: Denies blurriness of vision Ears, nose, mouth, throat, and face: Denies mucositis or sore throat Respiratory: Denies cough, dyspnea or wheezes Cardiovascular: Denies palpitation, chest discomfort or lower extremity swelling Gastrointestinal:  Denies nausea, heartburn or change in  bowel habits Skin: Denies abnormal skin rashes Lymphatics: Denies new lymphadenopathy or easy bruising Neurological:Denies numbness, tingling or new weaknesses Behavioral/Psych: Mood is stable, no new changes  All other systems were reviewed with the patient and are negative.  I have reviewed the past medical history, past surgical history, social history and family history with the patient and they are unchanged from previous note.  ALLERGIES:  is allergic to penicillins and sulfa antibiotics.  MEDICATIONS:  Current Outpatient Prescriptions  Medication Sig Dispense Refill  . acyclovir (ZOVIRAX) 400 MG tablet Take 1 tablet (400 mg total) by mouth 2 (two) times daily.  60 tablet  3  . carvedilol (COREG) 6.25 MG tablet Take 0.5 tablets (3.125 mg total) by mouth 2 (two) times daily with a meal.  90 tablet  3  . cholecalciferol (VITAMIN D) 1000 UNITS tablet Take 1,000 Units by mouth daily.      Marland Kitchen dexamethasone (DECADRON) 4 MG tablet Take 10 tablets (40 mg total) by mouth once a week.  100 tablet  1  . docusate sodium (COLACE) 100 MG capsule Take 100 mg by mouth 2 (two) times daily.      . mirtazapine (REMERON) 15 MG tablet TAKE 1 TABLET BY MOUTH AT BEDTIME  30 tablet  0  . Multiple Vitamins-Minerals (CENTRUM SILVER ULTRA WOMENS PO) Take by mouth daily.      Marland Kitchen ALPRAZolam (XANAX) 0.5 MG tablet Take 1 tablet (0.5 mg total) by mouth 2 (two) times daily as needed for anxiety.  30 tablet  0  . mirtazapine (REMERON) 30 MG tablet Take 1 tablet (30 mg total) by mouth at bedtime.  60 tablet  1  . ondansetron (ZOFRAN) 8 MG tablet Take 1 tablet (8 mg total) by mouth 2 (two) times daily. Start the day after chemo for 2 days. Then as needed for nausea or vomiting.  30 tablet  1   No current facility-administered medications for this visit.    PHYSICAL EXAMINATION: ECOG PERFORMANCE STATUS: 1 - Symptomatic but completely ambulatory  Filed Vitals:   01/13/14 1224  BP: 123/53  Pulse: 81  Temp: 98.9 F  (37.2 C)  Resp: 18   Filed Weights   01/13/14 1224  Weight: 147 lb 8 oz (66.906 kg)    GENERAL:alert, no distress and comfortable SKIN: skin color, texture, turgor are normal, no rashes or significant lesions EYES: normal, Conjunctiva are pink and non-injected, sclera clear OROPHARYNX:no exudate, no erythema and lips, buccal mucosa, and tongue normal  NECK: supple, thyroid normal size, non-tender, without nodularity LYMPH:  no palpable lymphadenopathy in the cervical, axillary or inguinal LUNGS: clear to auscultation and percussion with normal breathing effort HEART: regular rate & rhythm and no murmurs and no lower extremity edema ABDOMEN:abdomen soft, non-tender and normal bowel sounds Musculoskeletal:no cyanosis of digits and no clubbing  NEURO: alert & oriented x 3 with fluent speech, no focal motor/sensory deficits  LABORATORY DATA:  I have reviewed the data as listed    Component Value Date/Time   NA 140 01/13/2014 1152   NA 138 10/28/2013 0955   K 3.8 01/13/2014 1152   K 3.9 10/28/2013 0955   CL 99 10/28/2013 0955   CO2 26 01/13/2014 1152   CO2 28 10/28/2013 0955   GLUCOSE 111 01/13/2014 1152   GLUCOSE 100* 10/28/2013 0955   BUN 12.9 01/13/2014 1152   BUN 11 10/28/2013 0955   CREATININE 0.9 01/13/2014 1152   CREATININE 0.8 10/28/2013 0955   CALCIUM 9.2 01/13/2014 1152   CALCIUM 9.3 10/28/2013 0955   PROT 8.3 01/13/2014 1152   PROT 9.5* 08/08/2013 1127   ALBUMIN 3.4* 01/13/2014 1152   ALBUMIN 3.7 08/08/2013 1127   AST 12 01/13/2014 1152   AST 19 08/08/2013 1127   ALT 11 01/13/2014 1152   ALT 19 08/08/2013 1127   ALKPHOS 44 01/13/2014 1152   ALKPHOS 41 08/08/2013 1127   BILITOT 0.26 01/13/2014 1152   BILITOT 0.6 08/08/2013 1127    No results found for this basename: SPEP, UPEP,  kappa and lambda light chains    Lab Results  Component Value Date   WBC 14.4* 01/13/2014   NEUTROABS 10.0* 01/13/2014   HGB 9.8* 01/13/2014   HCT 30.0* 01/13/2014   MCV 101.4* 01/13/2014   PLT 109* 01/13/2014       Chemistry      Component Value Date/Time   NA 140 01/13/2014 1152   NA 138 10/28/2013 0955   K 3.8 01/13/2014 1152   K 3.9 10/28/2013 0955   CL 99 10/28/2013 0955   CO2 26 01/13/2014 1152   CO2 28 10/28/2013 0955   BUN 12.9 01/13/2014 1152   BUN 11 10/28/2013 0955   CREATININE 0.9 01/13/2014 1152   CREATININE 0.8 10/28/2013 0955      Component Value Date/Time   CALCIUM 9.2 01/13/2014 1152   CALCIUM 9.3 10/28/2013 0955   ALKPHOS 44 01/13/2014 1152   ALKPHOS 41 08/08/2013 1127   AST 12 01/13/2014 1152   AST 19 08/08/2013 1127   ALT 11 01/13/2014 1152   ALT 19 08/08/2013 1127   BILITOT 0.26 01/13/2014 1152   BILITOT 0.6 08/08/2013 1127  ASSESSMENT & PLAN:  Multiple myeloma, without mention of having achieved remission She tolerated treatment well apart from recent severe thrombocytopenia. I will proceed with modified dose of Velcade injection. She is contemplating and wondering about the role of stem cell transplant. Due to the lack of supportive care at home, I would not recommend her to proceed until definitive plan is in place to get caregivers around to take care of her after the stem cell transplant. Her insurance company will not pay for her to get transplant at [facility]. I would have to refer her to another transplant center if she elects to proceed with bone marrow transplant.    Thrombocytopenia This is likely due to recent treatment. The patient denies recent history of bleeding such as epistaxis, hematuria or hematochezia. She is asymptomatic from the low platelet count. I will observe for now.  she does not require transfusion now. I will modify the dose of Velcade. If the thrombocytopenia gets progressive worse in the future, I might have to delay her treatment.      Anemia in neoplastic disease This is likely due to recent treatment. The patient denies recent history of bleeding such as epistaxis, hematuria or hematochezia. She is asymptomatic  from the anemia. I will observe for now.  She does not require transfusion now. I will continue the chemotherapy at reduced dose as above.  If the anemia gets progressive worse in the future, I might have to delay her treatment or adjust the chemotherapy dose.     Osteopenia She will continue calcium with vitamin D. She gets monthly Zometa as well.   Rash This is minor, related to side effects of treatment. I recommended close observation.     Orders Placed This Encounter  Procedures  . SPEP & IFE with QIG    Standing Status: Future     Number of Occurrences:      Standing Expiration Date: 02/17/2015  . Kappa/lambda light chains    Standing Status: Future     Number of Occurrences:      Standing Expiration Date: 02/17/2015  . Beta 2 microglobulin, serum    Standing Status: Future     Number of Occurrences:      Standing Expiration Date: 02/17/2015   All questions were answered. The patient knows to call the clinic with any problems, questions or concerns. No barriers to learning was detected. I spent 25 minutes counseling the patient face to face. The total time spent in the appointment was 30 minutes and more than 50% was on counseling and review of test results     [facility], [place], MD 01/13/2014 4:49 PM

## 2014-01-13 NOTE — Assessment & Plan Note (Signed)
She will continue calcium with vitamin D. She gets monthly Zometa as well.

## 2014-01-13 NOTE — Assessment & Plan Note (Signed)
This is likely due to recent treatment. The patient denies recent history of bleeding such as epistaxis, hematuria or hematochezia. She is asymptomatic from the anemia. I will observe for now.  She does not require transfusion now. I will continue the chemotherapy at reduced dose as above.  If the anemia gets progressive worse in the future, I might have to delay her treatment or adjust the chemotherapy dose.

## 2014-01-14 ENCOUNTER — Telehealth: Payer: Self-pay | Admitting: *Deleted

## 2014-01-14 NOTE — Telephone Encounter (Signed)
Per [facility] staff message scheduled appts. Advised scheduler 

## 2014-01-16 ENCOUNTER — Ambulatory Visit (HOSPITAL_BASED_OUTPATIENT_CLINIC_OR_DEPARTMENT_OTHER): Payer: Medicare HMO

## 2014-01-16 VITALS — BP 124/54 | HR 71 | Temp 98.3°F | Resp 18

## 2014-01-16 DIAGNOSIS — Z5112 Encounter for antineoplastic immunotherapy: Secondary | ICD-10-CM

## 2014-01-16 DIAGNOSIS — C9000 Multiple myeloma not having achieved remission: Secondary | ICD-10-CM

## 2014-01-16 MED ORDER — ONDANSETRON HCL 8 MG PO TABS
8.0000 mg | ORAL_TABLET | Freq: Once | ORAL | Status: AC
  Administered 2014-01-16: 8 mg via ORAL

## 2014-01-16 MED ORDER — ONDANSETRON HCL 8 MG PO TABS
ORAL_TABLET | ORAL | Status: AC
  Filled 2014-01-16: qty 1

## 2014-01-16 MED ORDER — BORTEZOMIB CHEMO SQ INJECTION 3.5 MG (2.5MG/ML)
1.0000 mg/m2 | Freq: Once | INTRAMUSCULAR | Status: AC
  Administered 2014-01-16: 1.75 mg via SUBCUTANEOUS
  Filled 2014-01-16: qty 1.75

## 2014-01-19 ENCOUNTER — Telehealth: Payer: Self-pay | Admitting: Hematology and Oncology

## 2014-01-19 ENCOUNTER — Telehealth: Payer: Self-pay | Admitting: *Deleted

## 2014-01-19 ENCOUNTER — Other Ambulatory Visit: Payer: Self-pay | Admitting: *Deleted

## 2014-01-19 NOTE — Telephone Encounter (Signed)
added ptk appt per MD pof...per pof pt aware

## 2014-01-19 NOTE — Telephone Encounter (Signed)
Pt left a message stating she "would like an extra appointment with Dr Alvy Bimler to discuss a couple of things"

## 2014-01-20 ENCOUNTER — Ambulatory Visit (HOSPITAL_BASED_OUTPATIENT_CLINIC_OR_DEPARTMENT_OTHER): Payer: Medicare HMO

## 2014-01-20 ENCOUNTER — Other Ambulatory Visit (HOSPITAL_BASED_OUTPATIENT_CLINIC_OR_DEPARTMENT_OTHER): Payer: Medicare HMO

## 2014-01-20 VITALS — BP 145/71 | HR 72 | Temp 98.4°F | Resp 19

## 2014-01-20 DIAGNOSIS — Z5112 Encounter for antineoplastic immunotherapy: Secondary | ICD-10-CM

## 2014-01-20 DIAGNOSIS — C9000 Multiple myeloma not having achieved remission: Secondary | ICD-10-CM

## 2014-01-20 LAB — CBC WITH DIFFERENTIAL/PLATELET
BASO%: 0.3 % (ref 0.0–2.0)
Basophils Absolute: 0 10*3/uL (ref 0.0–0.1)
EOS%: 0 % (ref 0.0–7.0)
Eosinophils Absolute: 0 10*3/uL (ref 0.0–0.5)
HCT: 30.7 % — ABNORMAL LOW (ref 34.8–46.6)
HGB: 10 g/dL — ABNORMAL LOW (ref 11.6–15.9)
LYMPH%: 11.8 % — ABNORMAL LOW (ref 14.0–49.7)
MCH: 32.8 pg (ref 25.1–34.0)
MCHC: 32.7 g/dL (ref 31.5–36.0)
MCV: 100.3 fL (ref 79.5–101.0)
MONO#: 2.7 10*3/uL — ABNORMAL HIGH (ref 0.1–0.9)
MONO%: 15.9 % — ABNORMAL HIGH (ref 0.0–14.0)
NEUT#: 12.2 10*3/uL — ABNORMAL HIGH (ref 1.5–6.5)
NEUT%: 72 % (ref 38.4–76.8)
Platelets: 89 10*3/uL — ABNORMAL LOW (ref 145–400)
RBC: 3.06 10*6/uL — ABNORMAL LOW (ref 3.70–5.45)
RDW: 15.8 % — ABNORMAL HIGH (ref 11.2–14.5)
WBC: 16.9 10*3/uL — ABNORMAL HIGH (ref 3.9–10.3)
lymph#: 2 10*3/uL (ref 0.9–3.3)

## 2014-01-20 LAB — COMPREHENSIVE METABOLIC PANEL (CC13)
ALT: 12 U/L (ref 0–55)
AST: 13 U/L (ref 5–34)
Albumin: 3.3 g/dL — ABNORMAL LOW (ref 3.5–5.0)
Alkaline Phosphatase: 46 U/L (ref 40–150)
Anion Gap: 9 mEq/L (ref 3–11)
BUN: 15.9 mg/dL (ref 7.0–26.0)
CO2: 28 mEq/L (ref 22–29)
Calcium: 9.3 mg/dL (ref 8.4–10.4)
Chloride: 103 mEq/L (ref 98–109)
Creatinine: 0.8 mg/dL (ref 0.6–1.1)
Glucose: 92 mg/dl (ref 70–140)
Potassium: 3.7 mEq/L (ref 3.5–5.1)
Sodium: 140 mEq/L (ref 136–145)
Total Bilirubin: 0.26 mg/dL (ref 0.20–1.20)
Total Protein: 8.4 g/dL — ABNORMAL HIGH (ref 6.4–8.3)

## 2014-01-20 LAB — TECHNOLOGIST REVIEW: Technologist Review: 2

## 2014-01-20 MED ORDER — ONDANSETRON HCL 8 MG PO TABS
ORAL_TABLET | ORAL | Status: AC
  Filled 2014-01-20: qty 1

## 2014-01-20 MED ORDER — ONDANSETRON HCL 8 MG PO TABS
8.0000 mg | ORAL_TABLET | Freq: Once | ORAL | Status: AC
  Administered 2014-01-20: 8 mg via ORAL

## 2014-01-20 MED ORDER — ZOLEDRONIC ACID 4 MG/100ML IV SOLN
4.0000 mg | Freq: Once | INTRAVENOUS | Status: AC
  Administered 2014-01-20: 4 mg via INTRAVENOUS
  Filled 2014-01-20: qty 100

## 2014-01-20 MED ORDER — BORTEZOMIB CHEMO SQ INJECTION 3.5 MG (2.5MG/ML)
1.0000 mg/m2 | Freq: Once | INTRAMUSCULAR | Status: AC
  Administered 2014-01-20: 1.75 mg via SUBCUTANEOUS
  Filled 2014-01-20: qty 1.75

## 2014-01-20 NOTE — Addendum Note (Signed)
Addended by: Adalberto Cole on: 01/20/2014 11:52 AM   Modules accepted: Orders

## 2014-01-20 NOTE — Patient Instructions (Addendum)
[facility] Discharge Instructions for Patients Receiving Chemotherapy  Today you received the following chemotherapy agents: Velcade  To help prevent nausea and vomiting after your treatment, we encourage you to take your nausea medication as prescribed by your physician.    If you develop nausea and vomiting that is not controlled by your nausea medication, call the clinic.   BELOW ARE SYMPTOMS THAT SHOULD BE REPORTED IMMEDIATELY:  *FEVER GREATER THAN 100.5 F  *CHILLS WITH OR WITHOUT FEVER  NAUSEA AND VOMITING THAT IS NOT CONTROLLED WITH YOUR NAUSEA MEDICATION  *UNUSUAL SHORTNESS OF BREATH  *UNUSUAL BRUISING OR BLEEDING  TENDERNESS IN MOUTH AND THROAT WITH OR WITHOUT PRESENCE OF ULCERS  *URINARY PROBLEMS  *BOWEL PROBLEMS  UNUSUAL RASH Items with * indicate a potential emergency and should be followed up as soon as possible.  Feel free to call the clinic you have any questions or concerns. The clinic phone number is [phone].  Bortezomib injection (Velcade) What is this medicine? BORTEZOMIB (bor TEZ oh mib) is a chemotherapy drug. It slows the growth of cancer cells. This medicine is used to treat multiple myeloma, and certain lymphomas, such as mantle-cell lymphoma. This medicine may be used for other purposes; ask your health care provider or pharmacist if you have questions. COMMON BRAND NAME(S): Velcade What should I tell my health care provider before I take this medicine? They need to know if you have any of these conditions: -diabetes -heart disease -irregular heartbeat -liver disease -on hemodialysis -low blood counts, like low white blood cells, platelets, or hemoglobin -peripheral neuropathy -taking medicine for blood pressure -an unusual or allergic reaction to bortezomib, mannitol, boron, other medicines, foods, dyes, or preservatives -pregnant or trying to get pregnant -breast-feeding How should I use this medicine? This medicine is  for injection into a vein or for injection under the skin. It is given by a health care professional in a hospital or clinic setting. Talk to your pediatrician regarding the use of this medicine in children. Special care may be needed. Overdosage: If you think you have taken too much of this medicine contact a poison control center or emergency room at once. NOTE: This medicine is only for you. Do not share this medicine with others. What if I miss a dose? It is important not to miss your dose. Call your doctor or health care professional if you are unable to keep an appointment. What may interact with this medicine? This medicine may interact with the following medications: -ketoconazole -rifampin -ritonavir -St. John's Wort This list may not describe all possible interactions. Give your health care provider a list of all the medicines, herbs, non-prescription drugs, or dietary supplements you use. Also tell them if you smoke, drink alcohol, or use illegal drugs. Some items may interact with your medicine. What should I watch for while using this medicine? Visit your doctor for checks on your progress. This drug may make you feel generally unwell. This is not uncommon, as chemotherapy can affect healthy cells as well as cancer cells. Report any side effects. Continue your course of treatment even though you feel ill unless your doctor tells you to stop. You may get drowsy or dizzy. Do not drive, use machinery, or do anything that needs mental alertness until you know how this medicine affects you. Do not stand or sit up quickly, especially if you are an older patient. This reduces the risk of dizzy or fainting spells. In some cases, you may be given additional medicines   to help with side effects. Follow all directions for their use. Call your doctor or health care professional for advice if you get a fever, chills or sore throat, or other symptoms of a cold or flu. Do not treat yourself. This drug  decreases your body's ability to fight infections. Try to avoid being around people who are sick. This medicine may increase your risk to bruise or bleed. Call your doctor or health care professional if you notice any unusual bleeding. You may need blood work done while you are taking this medicine. In some patients, this medicine may cause a serious brain infection that may cause death. If you have any problems seeing, thinking, speaking, walking, or standing, tell your doctor right away. If you cannot reach your doctor, urgently seek other source of medical care. Do not become pregnant while taking this medicine. Women should inform their doctor if they wish to become pregnant or think they might be pregnant. There is a potential for serious side effects to an unborn child. Talk to your health care professional or pharmacist for more information. Do not breast-feed an infant while taking this medicine. Check with your doctor or health care professional if you get an attack of severe diarrhea, nausea and vomiting, or if you sweat a lot. The loss of too much body fluid can make it dangerous for you to take this medicine. What side effects may I notice from receiving this medicine? Side effects that you should report to your doctor or health care professional as soon as possible: -allergic reactions like skin rash, itching or hives, swelling of the face, lips, or tongue -breathing problems -changes in hearing -changes in vision -fast, irregular heartbeat -feeling faint or lightheaded, falls -pain, tingling, numbness in the hands or feet -right upper belly pain -seizures -swelling of the ankles, feet, hands -unusual bleeding or bruising -unusually weak or tired -vomiting -yellowing of the eyes or skin Side effects that usually do not require medical attention (report to your doctor or health care professional if they continue or are bothersome): -changes in emotions or  moods -constipation -diarrhea -loss of appetite -headache -irritation at site where injected -nausea This list may not describe all possible side effects. Call your doctor for medical advice about side effects. You may report side effects to FDA at 1-800-FDA-1088. Where should I keep my medicine? This drug is given in a hospital or clinic and will not be stored at home. NOTE: This sheet is a summary. It may not cover all possible information. If you have questions about this medicine, talk to your doctor, pharmacist, or health care provider.  2015, [employer] Standard. (2013-04-07 12:46:32)    

## 2014-01-20 NOTE — Progress Notes (Signed)
Per Dr. Alvy Bimler, okay to tx with platelets 89, and okay to tx without CMET.

## 2014-01-21 ENCOUNTER — Encounter: Payer: Self-pay | Admitting: Hematology and Oncology

## 2014-01-21 ENCOUNTER — Ambulatory Visit (HOSPITAL_BASED_OUTPATIENT_CLINIC_OR_DEPARTMENT_OTHER): Payer: Medicare HMO | Admitting: Hematology and Oncology

## 2014-01-21 DIAGNOSIS — C9000 Multiple myeloma not having achieved remission: Secondary | ICD-10-CM

## 2014-01-21 MED ORDER — LENALIDOMIDE 10 MG PO CAPS: 10.0000 mg | ORAL_CAPSULE | Freq: Every day | ORAL | Status: DC

## 2014-01-21 NOTE — Assessment & Plan Note (Signed)
The patient only had minor partial response with chemotherapy so far. I recommend the addition of Revlimid. We discussed the role of chemotherapy. The intent is for palliative.  We discussed some of the risks, benefits, side-effects of Revlimid.   Some of the short term side-effects included, though not limited to, risk of fatigue, weight loss, pancytopenia, life-threatening infections, need for transfusions of blood products, nausea, vomiting, change in bowel habits, blood clots, admission to hospital for various reasons, and risks of death.   Long term side-effects are also discussed including risks of infertility, permanent damage to nerve function, chronic fatigue, and rare secondary malignancy including bone marrow disorders such as acute leukemia.   The patient is aware that the response rates discussed earlier is not guaranteed.    After a long discussion, patient made an informed decision to proceed with the prescribed plan of care.  The patient and will let me know in 2 days time about referral to a bone marrow transplant Center. She is interested for stem cell harvest without proceeding with transplant due to lack of caregiver and uncertainty about the side effects of bone marrow transplant.

## 2014-01-21 NOTE — Progress Notes (Signed)
[place] OFFICE PROGRESS NOTE  Patient Care Team: Jinny Sanders, MD as PCP - General (Family Medicine) [name], MD as Referring Physician (Hematology and Oncology)  SUMMARY OF ONCOLOGIC HISTORY: Oncology History   Multiple myeloma, Ig A Lambda, M spike 3.54 grams, Calcium 9.2, Creatinine 0.8, Beta 2 microglobulin 4.52, IgA 4840 mg/dL, lambda light chain 75.4, albumin 3.6, hemoglobin 9.7, platelet 115    Primary site: Multiple Myeloma   Staging method: AJCC 6th Edition   Clinical: Stage IIA signed by [name], MD on 11/07/2013  2:46 PM   Summary: Stage IIA        Multiple myeloma, without mention of having achieved remission   10/31/2013 Bone Marrow Biopsy Bone marrow biopsy confirmed multiple myeloma with 40% bone marrow involvement. Skeletal survey showed minimal lesions in her score with generalized demineralization   11/10/2013 -  Chemotherapy The patient is started on induction chemotherapy with weekly dexamethasone 40 mg by mouth as well as Velcade subcutaneous injection on days 1, 4, 8 and 11. On 11/21/2013, she was started on monthly Zometa.   12/23/2013 Adverse Reaction The dose of Velcade was reduced due to thrombocytopenia.    INTERVAL HISTORY: Please see below for problem oriented charting. She is requesting urgent visit to discuss about bone marrow transplant. She denies new symptoms since the last visit.  REVIEW OF SYSTEMS:   All other systems were reviewed with the patient and are negative.  I have reviewed the past medical history, past surgical history, social history and family history with the patient and they are unchanged from previous note.  ALLERGIES:  is allergic to penicillins and sulfa antibiotics.  MEDICATIONS:  Current Outpatient Prescriptions  Medication Sig Dispense Refill  . acyclovir (ZOVIRAX) 400 MG tablet Take 1 tablet (400 mg total) by mouth 2 (two) times daily.  60 tablet  3  . ALPRAZolam (XANAX) 0.5 MG tablet Take  1 tablet (0.5 mg total) by mouth 2 (two) times daily as needed for anxiety.  30 tablet  0  . carvedilol (COREG) 6.25 MG tablet Take 0.5 tablets (3.125 mg total) by mouth 2 (two) times daily with a meal.  90 tablet  3  . cholecalciferol (VITAMIN D) 1000 UNITS tablet Take 1,000 Units by mouth daily.      Marland Kitchen dexamethasone (DECADRON) 4 MG tablet Take 10 tablets (40 mg total) by mouth once a week.  100 tablet  1  . docusate sodium (COLACE) 100 MG capsule Take 100 mg by mouth 2 (two) times daily.      Marland Kitchen loperamide (IMODIUM A-D) 2 MG tablet Take 2 mg by mouth 4 (four) times daily as needed for diarrhea or loose stools.      . mirtazapine (REMERON) 15 MG tablet TAKE 1 TABLET BY MOUTH AT BEDTIME  30 tablet  0  . mirtazapine (REMERON) 30 MG tablet Take 1 tablet (30 mg total) by mouth at bedtime.  60 tablet  1  . Multiple Vitamins-Minerals (CENTRUM SILVER ULTRA WOMENS PO) Take by mouth daily.      . ondansetron (ZOFRAN) 8 MG tablet Take 1 tablet (8 mg total) by mouth 2 (two) times daily. Start the day after chemo for 2 days. Then as needed for nausea or vomiting.  30 tablet  1   No current facility-administered medications for this visit.    PHYSICAL EXAMINATION: ECOG PERFORMANCE STATUS: 0 - Asymptomatic GENERAL:alert, no distress and comfortable Musculoskeletal:no cyanosis of digits and no clubbing  NEURO: alert &  oriented x 3 with fluent speech, no focal motor/sensory deficits  LABORATORY DATA:  I have reviewed the data as listed    Component Value Date/Time   NA 140 01/20/2014 1028   NA 138 10/28/2013 0955   K 3.7 01/20/2014 1028   K 3.9 10/28/2013 0955   CL 99 10/28/2013 0955   CO2 28 01/20/2014 1028   CO2 28 10/28/2013 0955   GLUCOSE 92 01/20/2014 1028   GLUCOSE 100* 10/28/2013 0955   BUN 15.9 01/20/2014 1028   BUN 11 10/28/2013 0955   CREATININE 0.8 01/20/2014 1028   CREATININE 0.8 10/28/2013 0955   CALCIUM 9.3 01/20/2014 1028   CALCIUM 9.3 10/28/2013 0955   PROT 8.4* 01/20/2014 1028   PROT 9.5* 08/08/2013  1127   ALBUMIN 3.3* 01/20/2014 1028   ALBUMIN 3.7 08/08/2013 1127   AST 13 01/20/2014 1028   AST 19 08/08/2013 1127   ALT 12 01/20/2014 1028   ALT 19 08/08/2013 1127   ALKPHOS 46 01/20/2014 1028   ALKPHOS 41 08/08/2013 1127   BILITOT 0.26 01/20/2014 1028   BILITOT 0.6 08/08/2013 1127    No results found for this basename: SPEP, UPEP,  kappa and lambda light chains    Lab Results  Component Value Date   WBC 16.9* 01/20/2014   NEUTROABS 12.2* 01/20/2014   HGB 10.0* 01/20/2014   HCT 30.7* 01/20/2014   MCV 100.3 01/20/2014   PLT 89* 01/20/2014      Chemistry      Component Value Date/Time   NA 140 01/20/2014 1028   NA 138 10/28/2013 0955   K 3.7 01/20/2014 1028   K 3.9 10/28/2013 0955   CL 99 10/28/2013 0955   CO2 28 01/20/2014 1028   CO2 28 10/28/2013 0955   BUN 15.9 01/20/2014 1028   BUN 11 10/28/2013 0955   CREATININE 0.8 01/20/2014 1028   CREATININE 0.8 10/28/2013 0955      Component Value Date/Time   CALCIUM 9.3 01/20/2014 1028   CALCIUM 9.3 10/28/2013 0955   ALKPHOS 46 01/20/2014 1028   ALKPHOS 41 08/08/2013 1127   AST 13 01/20/2014 1028   AST 19 08/08/2013 1127   ALT 12 01/20/2014 1028   ALT 19 08/08/2013 1127   BILITOT 0.26 01/20/2014 1028   BILITOT 0.6 08/08/2013 1127      ASSESSMENT & PLAN:  Multiple myeloma, without mention of having achieved remission The patient only had minor partial response with chemotherapy so far. I recommend the addition of Revlimid. We discussed the role of chemotherapy. The intent is for palliative.  We discussed some of the risks, benefits, side-effects of Revlimid.   Some of the short term side-effects included, though not limited to, risk of fatigue, weight loss, pancytopenia, life-threatening infections, need for transfusions of blood products, nausea, vomiting, change in bowel habits, blood clots, admission to hospital for various reasons, and risks of death.   Long term side-effects are also discussed including risks of infertility, permanent damage to  nerve function, chronic fatigue, and rare secondary malignancy including bone marrow disorders such as acute leukemia.   The patient is aware that the response rates discussed earlier is not guaranteed.    After a long discussion, patient made an informed decision to proceed with the prescribed plan of care.  The patient and will let me know in 2 days time about referral to a bone marrow transplant Center. She is interested for stem cell harvest without proceeding with transplant due to lack of caregiver and uncertainty  about the side effects of bone marrow transplant.   No orders of the defined types were placed in this encounter.   All questions were answered. The patient knows to call the clinic with any problems, questions or concerns. No barriers to learning was detected. I spent 30 minutes counseling the patient face to face. The total time spent in the appointment was 40 minutes and more than 50% was on counseling and review of test results     [facility], [place], MD 01/21/2014 5:49 PM

## 2014-01-22 ENCOUNTER — Other Ambulatory Visit: Payer: Self-pay | Admitting: Hematology and Oncology

## 2014-01-22 LAB — SPEP & IFE WITH QIG
Albumin ELP: 45.1 % — ABNORMAL LOW (ref 55.8–66.1)
Alpha-1-Globulin: 3.2 % (ref 2.9–4.9)
Alpha-2-Globulin: 6.6 % — ABNORMAL LOW (ref 7.1–11.8)
Beta 2: 35.1 % — ABNORMAL HIGH (ref 3.2–6.5)
Beta Globulin: 7.1 % (ref 4.7–7.2)
Gamma Globulin: 2.9 % — ABNORMAL LOW (ref 11.1–18.8)
IgA: 3170 mg/dL — ABNORMAL HIGH (ref 69–380)
IgG (Immunoglobin G), Serum: 236 mg/dL — ABNORMAL LOW (ref 690–1700)
IgM, Serum: 10 mg/dL — ABNORMAL LOW (ref 52–322)
M-Spike, %: 2.51 g/dL
Total Protein, Serum Electrophoresis: 8.1 g/dL (ref 6.0–8.3)

## 2014-01-22 LAB — KAPPA/LAMBDA LIGHT CHAINS
Kappa free light chain: 0.16 mg/dL — ABNORMAL LOW (ref 0.33–1.94)
Kappa:Lambda Ratio: 0 — ABNORMAL LOW (ref 0.26–1.65)
Lambda Free Lght Chn: 44.9 mg/dL — ABNORMAL HIGH (ref 0.57–2.63)

## 2014-01-22 LAB — BETA 2 MICROGLOBULIN, SERUM: Beta-2 Microglobulin: 4.03 mg/L — ABNORMAL HIGH (ref <=2.51)

## 2014-01-23 ENCOUNTER — Encounter: Payer: Self-pay | Admitting: *Deleted

## 2014-01-23 ENCOUNTER — Ambulatory Visit (HOSPITAL_BASED_OUTPATIENT_CLINIC_OR_DEPARTMENT_OTHER): Payer: Medicare HMO

## 2014-01-23 ENCOUNTER — Telehealth: Payer: Self-pay | Admitting: *Deleted

## 2014-01-23 ENCOUNTER — Encounter: Payer: Self-pay | Admitting: Hematology and Oncology

## 2014-01-23 VITALS — BP 136/58 | HR 75 | Temp 98.0°F | Resp 18

## 2014-01-23 DIAGNOSIS — Z5112 Encounter for antineoplastic immunotherapy: Secondary | ICD-10-CM

## 2014-01-23 DIAGNOSIS — C9000 Multiple myeloma not having achieved remission: Secondary | ICD-10-CM

## 2014-01-23 MED ORDER — BORTEZOMIB CHEMO SQ INJECTION 3.5 MG (2.5MG/ML)
1.0000 mg/m2 | Freq: Once | INTRAMUSCULAR | Status: AC
  Administered 2014-01-23: 1.75 mg via SUBCUTANEOUS
  Filled 2014-01-23: qty 0.7

## 2014-01-23 MED ORDER — ONDANSETRON HCL 8 MG PO TABS
8.0000 mg | ORAL_TABLET | Freq: Once | ORAL | Status: AC
  Administered 2014-01-23: 8 mg via ORAL

## 2014-01-23 MED ORDER — ONDANSETRON HCL 8 MG PO TABS
ORAL_TABLET | ORAL | Status: AC
  Filled 2014-01-23: qty 1

## 2014-01-23 NOTE — Progress Notes (Signed)
Faxed revlimid prescription to [facility] °

## 2014-01-23 NOTE — Patient Instructions (Signed)
[facility] Discharge Instructions for Patients Receiving Chemotherapy  Today you received the following chemotherapy agents Velcade.  To help prevent nausea and vomiting after your treatment, we encourage you to take your nausea medication as directed.    If you develop nausea and vomiting that is not controlled by your nausea medication, call the clinic.   BELOW ARE SYMPTOMS THAT SHOULD BE REPORTED IMMEDIATELY:  *FEVER GREATER THAN 100.5 F  *CHILLS WITH OR WITHOUT FEVER  NAUSEA AND VOMITING THAT IS NOT CONTROLLED WITH YOUR NAUSEA MEDICATION  *UNUSUAL SHORTNESS OF BREATH  *UNUSUAL BRUISING OR BLEEDING  TENDERNESS IN MOUTH AND THROAT WITH OR WITHOUT PRESENCE OF ULCERS  *URINARY PROBLEMS  *BOWEL PROBLEMS  UNUSUAL RASH Items with * indicate a potential emergency and should be followed up as soon as possible.  Feel free to call the clinic you have any questions or concerns. The clinic phone number is [phone].    

## 2014-01-23 NOTE — Telephone Encounter (Signed)
Pt left VM asking if she needs to call [employer] to do the survey? She also wants to be referred to [facility] for BMT.  Notified Dr. Alvy Bimler pt requests referral to [facility] and request sent to [place] in [facility].   Called pt back and left her VM informing she does need to call [employer] to complete pt survey w/i next few days.  Please call us back if any questions.

## 2014-01-23 NOTE — Progress Notes (Signed)
Called patient and gave her name and phone # of another female patient who has been through transplant at her age and is still on maintenance revlimid who was glad to speak with her.

## 2014-01-23 NOTE — Progress Notes (Signed)
Forwarded script to Charlena Cross in managed care department with copy of her [employer] card she uses for prescriptions.  Authorization #[id number]. Reminded patient she needs to do her patient survey when she gets home.

## 2014-01-23 NOTE — Progress Notes (Signed)
RECEIVED A FAX FROM [facility] CONCERNING A CONFIRMATION OF [facility] RECEIPT FOR PT. REFERRAL. 

## 2014-01-27 ENCOUNTER — Telehealth: Payer: Self-pay | Admitting: *Deleted

## 2014-01-27 NOTE — Telephone Encounter (Signed)
VM from [place] at [facility] states no assistance available for pt's co-pay.  Pt will need to pay over $500 per month initially for Revlimid and her cost may even go up to more than $2,000 per month in a few months.  Apparently pt's income is too high to qualify her for assistance through available foundations.  Pt states she had not budgeted for this type of cost but she is willing to go ahead and have revlimid delivered and will continue to apply for assistance and hope she can get some help before the cost goes into the thousands per month.  Instructed pt to notify nurse when she gets the Revlimid. She verbalized understanding.

## 2014-01-27 NOTE — Telephone Encounter (Signed)
Pt left VM states she will have high co-pay for Revlimid $555 per month for first few months and then it will increase to $2,000 to $3,000 per month.  She asks if any assistance available to help with large copay?  Left message w/ Carmelina Noun in managed care dept to contact pt regarding above.

## 2014-01-28 ENCOUNTER — Encounter: Payer: Self-pay | Admitting: Hematology and Oncology

## 2014-01-28 ENCOUNTER — Telehealth: Payer: Self-pay | Admitting: Hematology and Oncology

## 2014-01-28 ENCOUNTER — Telehealth: Payer: Self-pay | Admitting: *Deleted

## 2014-01-28 NOTE — Progress Notes (Signed)
Called and let the patient know there are no others options for asst.

## 2014-01-28 NOTE — Telephone Encounter (Signed)
Pt is receiving Revlimid today and she wants to know if she should start taking it today?

## 2014-01-28 NOTE — Telephone Encounter (Signed)
yes

## 2014-01-28 NOTE — Telephone Encounter (Signed)
Instructed pt she can start taking her Revlimid today, take at the same time every day.  Call us if any problems or questions.  She verbalized understanding.

## 2014-01-28 NOTE — Telephone Encounter (Signed)
Pt appt.@ [facility] is 02-11-14@ 10:30. Medical records faxed. Slides will be fedex'ed. Pt is aware

## 2014-01-28 NOTE — Progress Notes (Signed)
Called and let Cameo(dr Gorsuch nurse), all options have been researched and she is over qualified for any asst with Revlimid.

## 2014-02-03 ENCOUNTER — Encounter: Payer: Self-pay | Admitting: Hematology and Oncology

## 2014-02-03 ENCOUNTER — Ambulatory Visit (HOSPITAL_BASED_OUTPATIENT_CLINIC_OR_DEPARTMENT_OTHER): Payer: Medicare HMO | Admitting: Hematology and Oncology

## 2014-02-03 ENCOUNTER — Ambulatory Visit (HOSPITAL_BASED_OUTPATIENT_CLINIC_OR_DEPARTMENT_OTHER): Payer: Medicare HMO

## 2014-02-03 ENCOUNTER — Telehealth: Payer: Self-pay | Admitting: *Deleted

## 2014-02-03 ENCOUNTER — Telehealth: Payer: Self-pay | Admitting: Hematology and Oncology

## 2014-02-03 ENCOUNTER — Other Ambulatory Visit (HOSPITAL_BASED_OUTPATIENT_CLINIC_OR_DEPARTMENT_OTHER): Payer: Medicare HMO

## 2014-02-03 VITALS — BP 129/55 | HR 83 | Temp 99.2°F | Resp 18 | Ht 63.0 in | Wt 149.1 lb

## 2014-02-03 DIAGNOSIS — C9000 Multiple myeloma not having achieved remission: Secondary | ICD-10-CM

## 2014-02-03 DIAGNOSIS — D72829 Elevated white blood cell count, unspecified: Secondary | ICD-10-CM | POA: Insufficient documentation

## 2014-02-03 DIAGNOSIS — D696 Thrombocytopenia, unspecified: Secondary | ICD-10-CM

## 2014-02-03 DIAGNOSIS — R197 Diarrhea, unspecified: Secondary | ICD-10-CM | POA: Insufficient documentation

## 2014-02-03 DIAGNOSIS — Z5112 Encounter for antineoplastic immunotherapy: Secondary | ICD-10-CM

## 2014-02-03 DIAGNOSIS — D630 Anemia in neoplastic disease: Secondary | ICD-10-CM

## 2014-02-03 LAB — CBC WITH DIFFERENTIAL/PLATELET
BASO%: 0.3 % (ref 0.0–2.0)
Basophils Absolute: 0 10*3/uL (ref 0.0–0.1)
EOS%: 0 % (ref 0.0–7.0)
Eosinophils Absolute: 0 10*3/uL (ref 0.0–0.5)
HCT: 30.4 % — ABNORMAL LOW (ref 34.8–46.6)
HGB: 10 g/dL — ABNORMAL LOW (ref 11.6–15.9)
LYMPH%: 12.2 % — ABNORMAL LOW (ref 14.0–49.7)
MCH: 32.9 pg (ref 25.1–34.0)
MCHC: 32.9 g/dL (ref 31.5–36.0)
MCV: 100 fL (ref 79.5–101.0)
MONO#: 2.1 10*3/uL — ABNORMAL HIGH (ref 0.1–0.9)
MONO%: 16.3 % — ABNORMAL HIGH (ref 0.0–14.0)
NEUT#: 9.3 10*3/uL — ABNORMAL HIGH (ref 1.5–6.5)
NEUT%: 71.2 % (ref 38.4–76.8)
Platelets: 126 10*3/uL — ABNORMAL LOW (ref 145–400)
RBC: 3.04 10*6/uL — ABNORMAL LOW (ref 3.70–5.45)
RDW: 15.6 % — ABNORMAL HIGH (ref 11.2–14.5)
WBC: 13 10*3/uL — ABNORMAL HIGH (ref 3.9–10.3)
lymph#: 1.6 10*3/uL (ref 0.9–3.3)

## 2014-02-03 LAB — COMPREHENSIVE METABOLIC PANEL
ALT: 12 U/L (ref 0–35)
AST: 14 U/L (ref 0–37)
Albumin: 3.5 g/dL (ref 3.5–5.2)
Alkaline Phosphatase: 53 U/L (ref 39–117)
BUN: 18 mg/dL (ref 6–23)
CO2: 25 mEq/L (ref 19–32)
Calcium: 9.2 mg/dL (ref 8.4–10.5)
Chloride: 101 mEq/L (ref 96–112)
Creatinine, Ser: 0.68 mg/dL (ref 0.50–1.10)
Glucose, Bld: 108 mg/dL — ABNORMAL HIGH (ref 70–99)
Potassium: 3.8 mEq/L (ref 3.5–5.3)
Sodium: 141 mEq/L (ref 135–145)
Total Bilirubin: <0.2 mg/dL — ABNORMAL LOW (ref 0.2–1.2)
Total Protein: 8.5 g/dL — ABNORMAL HIGH (ref 6.0–8.3)

## 2014-02-03 LAB — TECHNOLOGIST REVIEW: Technologist Review: 3

## 2014-02-03 MED ORDER — BORTEZOMIB CHEMO SQ INJECTION 3.5 MG (2.5MG/ML)
1.0000 mg/m2 | Freq: Once | INTRAMUSCULAR | Status: AC
  Administered 2014-02-03: 1.75 mg via SUBCUTANEOUS
  Filled 2014-02-03: qty 1.75

## 2014-02-03 MED ORDER — ONDANSETRON HCL 8 MG PO TABS
ORAL_TABLET | ORAL | Status: AC
  Filled 2014-02-03: qty 1

## 2014-02-03 MED ORDER — ONDANSETRON HCL 8 MG PO TABS
8.0000 mg | ORAL_TABLET | Freq: Once | ORAL | Status: AC
  Administered 2014-02-03: 8 mg via ORAL

## 2014-02-03 NOTE — Assessment & Plan Note (Signed)
This could be related to dexamethasone. She has no signs and symptoms to suggest infection. Recommend observation only.

## 2014-02-03 NOTE — Progress Notes (Signed)
Velcade was checked by Kathe Becton, RN and Margaretmary Eddy, RN.

## 2014-02-03 NOTE — Patient Instructions (Signed)
[facility] Discharge Instructions for Patients Receiving Chemotherapy  Today you received the following chemotherapy agents: Velcade.  To help prevent nausea and vomiting after your treatment, we encourage you to take your nausea medication as prescribed.   If you develop nausea and vomiting that is not controlled by your nausea medication, call the clinic.   BELOW ARE SYMPTOMS THAT SHOULD BE REPORTED IMMEDIATELY:  *FEVER GREATER THAN 100.5 F  *CHILLS WITH OR WITHOUT FEVER  NAUSEA AND VOMITING THAT IS NOT CONTROLLED WITH YOUR NAUSEA MEDICATION  *UNUSUAL SHORTNESS OF BREATH  *UNUSUAL BRUISING OR BLEEDING  TENDERNESS IN MOUTH AND THROAT WITH OR WITHOUT PRESENCE OF ULCERS  *URINARY PROBLEMS  *BOWEL PROBLEMS  UNUSUAL RASH Items with * indicate a potential emergency and should be followed up as soon as possible.  Feel free to call the clinic you have any questions or concerns. The clinic phone number is [phone].    

## 2014-02-03 NOTE — Progress Notes (Signed)
Patient had sent an email to advise needed help with [facility] bill. I advised her to see if they have patient/copay asst. She wanted to see if [facility] knew about [facility] policy.

## 2014-02-03 NOTE — Assessment & Plan Note (Signed)
This could be related to side effects of treatment. I recommend avoiding dairy products and to use Imodium as needed.

## 2014-02-03 NOTE — Telephone Encounter (Signed)
Pt confirmed labs/ov per 08/11 POF, gave pt AVS...KJ °

## 2014-02-03 NOTE — Assessment & Plan Note (Signed)
This is likely due to recent treatment. The patient denies recent history of bleeding such as epistaxis, hematuria or hematochezia. She is asymptomatic from the low platelet count. I will observe for now.  she does not require transfusion now. I will modify the dose of Velcade. If the thrombocytopenia gets progressive worse in the future, I might have to delay her treatment.

## 2014-02-03 NOTE — Assessment & Plan Note (Signed)
This is likely due to recent treatment. The patient denies recent history of bleeding such as epistaxis, hematuria or hematochezia. She is asymptomatic from the anemia. I will observe for now.  She does not require transfusion now. I will continue the chemotherapy at reduced dose as above.  If the anemia gets progressive worse in the future, I might have to delay her treatment or adjust the chemotherapy dose.

## 2014-02-03 NOTE — Assessment & Plan Note (Signed)
The patient only had minor partial response with chemotherapy so far. I recommend the addition of Revlimid and she started last week. She will take Revlimid for 14 days along with her treatment with weekly Velcade. Once the patient has achieved greater than partial response to treatment, I will start to taper dexamethasone. She will continue on monthly Zometa. So far she had no side effects from treatment. She had her dental visit recently with no complications.

## 2014-02-03 NOTE — Telephone Encounter (Signed)
Per [facility] staff message scheduled appts. Advised scheduler 

## 2014-02-03 NOTE — Progress Notes (Signed)
[place] OFFICE PROGRESS NOTE  Patient Care Team: Jinny Sanders, MD as PCP - General (Family Medicine) [name], MD as Referring Physician (Hematology and Oncology)  SUMMARY OF ONCOLOGIC HISTORY: Oncology History   Multiple myeloma, Ig A Lambda, M spike 3.54 grams, Calcium 9.2, Creatinine 0.8, Beta 2 microglobulin 4.52, IgA 4840 mg/dL, lambda light chain 75.4, albumin 3.6, hemoglobin 9.7, platelet 115    Primary site: Multiple Myeloma   Staging method: AJCC 6th Edition   Clinical: Stage IIA signed by [name], MD on 11/07/2013  2:46 PM   Summary: Stage IIA        Multiple myeloma, without mention of having achieved remission   10/31/2013 Bone Marrow Biopsy Bone marrow biopsy confirmed multiple myeloma with 40% bone marrow involvement. Skeletal survey showed minimal lesions in her score with generalized demineralization   11/10/2013 -  Chemotherapy The patient is started on induction chemotherapy with weekly dexamethasone 40 mg by mouth as well as Velcade subcutaneous injection on days 1, 4, 8 and 11. On 11/21/2013, she was started on monthly Zometa.   12/23/2013 Adverse Reaction The dose of Velcade was reduced due to thrombocytopenia.   01/28/2014 -  Chemotherapy Revlimid is added    INTERVAL HISTORY: Please see below for problem oriented charting. She tolerated Revlimid well apart from new onset diarrhea. She had liquid bowel movements 3-4 times a day since she started treatment last week. She denies any cramps in her abdomen. Denies recent peripheral neuropathy. REVIEW OF SYSTEMS:   Constitutional: Denies fevers, chills or abnormal weight loss Eyes: Denies blurriness of vision Ears, nose, mouth, throat, and face: Denies mucositis or sore throat Respiratory: Denies cough, dyspnea or wheezes Cardiovascular: Denies palpitation, chest discomfort or lower extremity swelling Gastrointestinal:  Denies nausea, heartburn or change in bowel habits Skin: Denies  abnormal skin rashes. She has easy bruising Lymphatics: Denies new lymphadenopathy  Neurological:Denies numbness, tingling or new weaknesses Behavioral/Psych: Mood is stable, no new changes  All other systems were reviewed with the patient and are negative.  I have reviewed the past medical history, past surgical history, social history and family history with the patient and they are unchanged from previous note.  ALLERGIES:  is allergic to penicillins and sulfa antibiotics.  MEDICATIONS:  Current Outpatient Prescriptions  Medication Sig Dispense Refill  . acyclovir (ZOVIRAX) 400 MG tablet Take 1 tablet (400 mg total) by mouth 2 (two) times daily.  60 tablet  3  . ALPRAZolam (XANAX) 0.5 MG tablet Take 1 tablet (0.5 mg total) by mouth 2 (two) times daily as needed for anxiety.  30 tablet  0  . carvedilol (COREG) 6.25 MG tablet Take 0.5 tablets (3.125 mg total) by mouth 2 (two) times daily with a meal.  90 tablet  3  . cholecalciferol (VITAMIN D) 1000 UNITS tablet Take 1,000 Units by mouth daily.      Marland Kitchen dexamethasone (DECADRON) 4 MG tablet Take 10 tablets (40 mg total) by mouth once a week.  100 tablet  1  . docusate sodium (COLACE) 100 MG capsule Take 100 mg by mouth 2 (two) times daily.      Marland Kitchen lenalidomide (REVLIMID) 10 MG capsule Take 1 capsule (10 mg total) by mouth daily.  21 capsule  0  . loperamide (IMODIUM A-D) 2 MG tablet Take 2 mg by mouth 4 (four) times daily as needed for diarrhea or loose stools.      . mirtazapine (REMERON) 15 MG tablet TAKE 1 TABLET BY  MOUTH AT BEDTIME  30 tablet  0  . mirtazapine (REMERON) 30 MG tablet Take 1 tablet (30 mg total) by mouth at bedtime.  60 tablet  1  . Multiple Vitamins-Minerals (CENTRUM SILVER ULTRA WOMENS PO) Take by mouth daily.      . ondansetron (ZOFRAN) 8 MG tablet Take 1 tablet (8 mg total) by mouth 2 (two) times daily. Start the day after chemo for 2 days. Then as needed for nausea or vomiting.  30 tablet  1   No current  facility-administered medications for this visit.    PHYSICAL EXAMINATION: ECOG PERFORMANCE STATUS: 0 - Asymptomatic  Filed Vitals:   02/03/14 0918  BP: 129/55  Pulse: 83  Temp: 99.2 F (37.3 C)  Resp: 18   Filed Weights   02/03/14 0918  Weight: 149 lb 1.6 oz (67.631 kg)    GENERAL:alert, no distress and comfortable SKIN: skin color, texture, turgor are normal, no rashes or significant lesions. She had minor bruising EYES: normal, Conjunctiva are pink and non-injected, sclera clear OROPHARYNX:no exudate, no erythema and lips, buccal mucosa, and tongue normal  NECK: supple, thyroid normal size, non-tender, without nodularity LYMPH:  no palpable lymphadenopathy in the cervical, axillary or inguinal LUNGS: clear to auscultation and percussion with normal breathing effort HEART: regular rate & rhythm and no murmurs and no lower extremity edema ABDOMEN:abdomen soft, non-tender and normal bowel sounds Musculoskeletal:no cyanosis of digits and no clubbing  NEURO: alert & oriented x 3 with fluent speech, no focal motor/sensory deficits  LABORATORY DATA:  I have reviewed the data as listed    Component Value Date/Time   NA 140 01/20/2014 1028   NA 138 10/28/2013 0955   K 3.7 01/20/2014 1028   K 3.9 10/28/2013 0955   CL 99 10/28/2013 0955   CO2 28 01/20/2014 1028   CO2 28 10/28/2013 0955   GLUCOSE 92 01/20/2014 1028   GLUCOSE 100* 10/28/2013 0955   BUN 15.9 01/20/2014 1028   BUN 11 10/28/2013 0955   CREATININE 0.8 01/20/2014 1028   CREATININE 0.8 10/28/2013 0955   CALCIUM 9.3 01/20/2014 1028   CALCIUM 9.3 10/28/2013 0955   PROT 8.4* 01/20/2014 1028   PROT 9.5* 08/08/2013 1127   ALBUMIN 3.3* 01/20/2014 1028   ALBUMIN 3.7 08/08/2013 1127   AST 13 01/20/2014 1028   AST 19 08/08/2013 1127   ALT 12 01/20/2014 1028   ALT 19 08/08/2013 1127   ALKPHOS 46 01/20/2014 1028   ALKPHOS 41 08/08/2013 1127   BILITOT 0.26 01/20/2014 1028   BILITOT 0.6 08/08/2013 1127    No results found for this basename: SPEP,  UPEP,  kappa and lambda light chains    Lab Results  Component Value Date   WBC 13.0* 02/03/2014   NEUTROABS 9.3* 02/03/2014   HGB 10.0* 02/03/2014   HCT 30.4* 02/03/2014   MCV 100.0 02/03/2014   PLT 126* 02/03/2014      Chemistry      Component Value Date/Time   NA 140 01/20/2014 1028   NA 138 10/28/2013 0955   K 3.7 01/20/2014 1028   K 3.9 10/28/2013 0955   CL 99 10/28/2013 0955   CO2 28 01/20/2014 1028   CO2 28 10/28/2013 0955   BUN 15.9 01/20/2014 1028   BUN 11 10/28/2013 0955   CREATININE 0.8 01/20/2014 1028   CREATININE 0.8 10/28/2013 0955      Component Value Date/Time   CALCIUM 9.3 01/20/2014 1028   CALCIUM 9.3 10/28/2013 0955   ALKPHOS 46  01/20/2014 1028   ALKPHOS 41 08/08/2013 1127   AST 13 01/20/2014 1028   AST 19 08/08/2013 1127   ALT 12 01/20/2014 1028   ALT 19 08/08/2013 1127   BILITOT 0.26 01/20/2014 1028   BILITOT 0.6 08/08/2013 1127      ASSESSMENT & PLAN:  Multiple myeloma, without mention of having achieved remission The patient only had minor partial response with chemotherapy so far. I recommend the addition of Revlimid and she started last week. She will take Revlimid for 14 days along with her treatment with weekly Velcade. Once the patient has achieved greater than partial response to treatment, I will start to taper dexamethasone. She will continue on monthly Zometa. So far she had no side effects from treatment. She had her dental visit recently with no complications.  Thrombocytopenia This is likely due to recent treatment. The patient denies recent history of bleeding such as epistaxis, hematuria or hematochezia. She is asymptomatic from the low platelet count. I will observe for now.  she does not require transfusion now. I will modify the dose of Velcade. If the thrombocytopenia gets progressive worse in the future, I might have to delay her treatment.        Anemia in neoplastic disease This is likely due to recent treatment. The patient denies recent history  of bleeding such as epistaxis, hematuria or hematochezia. She is asymptomatic from the anemia. I will observe for now.  She does not require transfusion now. I will continue the chemotherapy at reduced dose as above.  If the anemia gets progressive worse in the future, I might have to delay her treatment or adjust the chemotherapy dose.       Diarrhea This could be related to side effects of treatment. I recommend avoiding dairy products and to use Imodium as needed.  Leukocytosis, unspecified This could be related to dexamethasone. She has no signs and symptoms to suggest infection. Recommend observation only.   Orders Placed This Encounter  Procedures  . SPEP & IFE with QIG    Standing Status: Future     Number of Occurrences:      Standing Expiration Date: 03/10/2015  . Kappa/lambda light chains    Standing Status: Future     Number of Occurrences:      Standing Expiration Date: 03/10/2015  . Beta 2 microglobulin, serum    Standing Status: Future     Number of Occurrences:      Standing Expiration Date: 03/10/2015   All questions were answered. The patient knows to call the clinic with any problems, questions or concerns. No barriers to learning was detected. I spent 25 minutes counseling the patient face to face. The total time spent in the appointment was 30 minutes and more than 50% was on counseling and review of test results     [facility], [place], MD 02/03/2014 9:55 AM

## 2014-02-06 ENCOUNTER — Ambulatory Visit (HOSPITAL_BASED_OUTPATIENT_CLINIC_OR_DEPARTMENT_OTHER): Payer: Medicare HMO

## 2014-02-06 VITALS — BP 136/61 | HR 69 | Temp 99.0°F | Resp 18

## 2014-02-06 DIAGNOSIS — Z5112 Encounter for antineoplastic immunotherapy: Secondary | ICD-10-CM

## 2014-02-06 DIAGNOSIS — C9000 Multiple myeloma not having achieved remission: Secondary | ICD-10-CM

## 2014-02-06 MED ORDER — ONDANSETRON HCL 8 MG PO TABS
8.0000 mg | ORAL_TABLET | Freq: Once | ORAL | Status: AC
  Administered 2014-02-06: 8 mg via ORAL

## 2014-02-06 MED ORDER — ONDANSETRON HCL 8 MG PO TABS
ORAL_TABLET | ORAL | Status: AC
  Filled 2014-02-06: qty 1

## 2014-02-06 MED ORDER — BORTEZOMIB CHEMO SQ INJECTION 3.5 MG (2.5MG/ML)
1.0000 mg/m2 | Freq: Once | INTRAMUSCULAR | Status: AC
  Administered 2014-02-06: 1.75 mg via SUBCUTANEOUS
  Filled 2014-02-06: qty 1.75

## 2014-02-06 NOTE — Progress Notes (Signed)
Velcade frequency clarified with Dr. Alvy Bimler. Pt to continue current induction regimen (D1,4,8,11) with Revlimid at home. Pt began Revlimid 01/28/14- per Dr. Alvy Bimler, continue until week off of Velcade. Take same week off of Revlimid. Pt voiced understanding. Teach back done.

## 2014-02-10 ENCOUNTER — Ambulatory Visit (HOSPITAL_BASED_OUTPATIENT_CLINIC_OR_DEPARTMENT_OTHER): Payer: Medicare HMO

## 2014-02-10 ENCOUNTER — Telehealth: Payer: Self-pay | Admitting: *Deleted

## 2014-02-10 ENCOUNTER — Other Ambulatory Visit (HOSPITAL_BASED_OUTPATIENT_CLINIC_OR_DEPARTMENT_OTHER): Payer: Medicare HMO

## 2014-02-10 ENCOUNTER — Other Ambulatory Visit: Payer: Self-pay | Admitting: Hematology and Oncology

## 2014-02-10 VITALS — BP 124/59 | HR 64 | Temp 98.2°F | Resp 20

## 2014-02-10 DIAGNOSIS — Z5112 Encounter for antineoplastic immunotherapy: Secondary | ICD-10-CM

## 2014-02-10 DIAGNOSIS — C9000 Multiple myeloma not having achieved remission: Secondary | ICD-10-CM

## 2014-02-10 LAB — CBC WITH DIFFERENTIAL/PLATELET
BASO%: 0.2 % (ref 0.0–2.0)
Basophils Absolute: 0 10*3/uL (ref 0.0–0.1)
EOS%: 0.2 % (ref 0.0–7.0)
Eosinophils Absolute: 0 10*3/uL (ref 0.0–0.5)
HCT: 31.4 % — ABNORMAL LOW (ref 34.8–46.6)
HGB: 10.3 g/dL — ABNORMAL LOW (ref 11.6–15.9)
LYMPH%: 16.2 % (ref 14.0–49.7)
MCH: 33.2 pg (ref 25.1–34.0)
MCHC: 32.8 g/dL (ref 31.5–36.0)
MCV: 101.3 fL — ABNORMAL HIGH (ref 79.5–101.0)
MONO#: 1.7 10*3/uL — ABNORMAL HIGH (ref 0.1–0.9)
MONO%: 14 % (ref 0.0–14.0)
NEUT#: 8.4 10*3/uL — ABNORMAL HIGH (ref 1.5–6.5)
NEUT%: 69.4 % (ref 38.4–76.8)
Platelets: 62 10*3/uL — ABNORMAL LOW (ref 145–400)
RBC: 3.1 10*6/uL — ABNORMAL LOW (ref 3.70–5.45)
RDW: 15.6 % — ABNORMAL HIGH (ref 11.2–14.5)
WBC: 12.1 10*3/uL — ABNORMAL HIGH (ref 3.9–10.3)
lymph#: 2 10*3/uL (ref 0.9–3.3)

## 2014-02-10 LAB — COMPREHENSIVE METABOLIC PANEL (CC13)
ALT: 9 U/L (ref 0–55)
AST: 13 U/L (ref 5–34)
Albumin: 3.5 g/dL (ref 3.5–5.0)
Alkaline Phosphatase: 41 U/L (ref 40–150)
Anion Gap: 9 mEq/L (ref 3–11)
BUN: 11.4 mg/dL (ref 7.0–26.0)
CO2: 26 mEq/L (ref 22–29)
Calcium: 8.9 mg/dL (ref 8.4–10.4)
Chloride: 104 mEq/L (ref 98–109)
Creatinine: 0.7 mg/dL (ref 0.6–1.1)
Glucose: 112 mg/dl (ref 70–140)
Potassium: 3.6 mEq/L (ref 3.5–5.1)
Sodium: 138 mEq/L (ref 136–145)
Total Bilirubin: 0.29 mg/dL (ref 0.20–1.20)
Total Protein: 8.1 g/dL (ref 6.4–8.3)

## 2014-02-10 MED ORDER — ONDANSETRON HCL 8 MG PO TABS
ORAL_TABLET | ORAL | Status: AC
  Filled 2014-02-10: qty 1

## 2014-02-10 MED ORDER — BORTEZOMIB CHEMO SQ INJECTION 3.5 MG (2.5MG/ML)
1.0000 mg/m2 | Freq: Once | INTRAMUSCULAR | Status: AC
  Administered 2014-02-10: 1.75 mg via SUBCUTANEOUS
  Filled 2014-02-10: qty 1.75

## 2014-02-10 MED ORDER — ONDANSETRON HCL 8 MG PO TABS
8.0000 mg | ORAL_TABLET | Freq: Once | ORAL | Status: AC
  Administered 2014-02-10: 8 mg via ORAL

## 2014-02-10 NOTE — Progress Notes (Signed)
Vebal consent given by Dr. Alvy Bimler to treat patient today with plalets of 95.1.

## 2014-02-10 NOTE — Telephone Encounter (Signed)
Instructions to hold Revlimid until next cycle 9/01 given to pt in infusion room today.  She verbalized understanding.

## 2014-02-10 NOTE — Patient Instructions (Signed)
[place] Discharge Instructions for Patients Receiving Chemotherapy  Today you received the following chemotherapy agents Velcade  To help prevent nausea and vomiting after your treatment, we encourage you to take your nausea medication Zofran 8 mg  Every 8 hours if needed   If you develop nausea and vomiting that is not controlled by your nausea medication, call the clinic.   BELOW ARE SYMPTOMS THAT SHOULD BE REPORTED IMMEDIATELY:  *FEVER GREATER THAN 100.5 F  *CHILLS WITH OR WITHOUT FEVER  NAUSEA AND VOMITING THAT IS NOT CONTROLLED WITH YOUR NAUSEA MEDICATION  *UNUSUAL SHORTNESS OF BREATH  *UNUSUAL BRUISING OR BLEEDING  TENDERNESS IN MOUTH AND THROAT WITH OR WITHOUT PRESENCE OF ULCERS  *URINARY PROBLEMS  *BOWEL PROBLEMS  UNUSUAL RASH Items with * indicate a potential emergency and should be followed up as soon as possible.  Feel free to call the clinic you have any questions or concerns. The clinic phone number is [phone].

## 2014-02-10 NOTE — Telephone Encounter (Signed)
Message copied by Cathlean Cower on Tue Feb 10, 2014  8:52 AM ------      Message from: [employer], [place]      Created: Tue Feb 10, 2014  8:34 AM      Regarding: low platelets       I wrote order to treat with low platelets. Please tell patient to hold revlimid until start of next cycle      ----- Message -----         From: [facility]         Sent: 02/10/2014   8:22 AM           To: [name], MD                   ------

## 2014-02-13 ENCOUNTER — Telehealth: Payer: Self-pay | Admitting: *Deleted

## 2014-02-13 ENCOUNTER — Ambulatory Visit (HOSPITAL_BASED_OUTPATIENT_CLINIC_OR_DEPARTMENT_OTHER): Payer: Medicare HMO

## 2014-02-13 VITALS — BP 131/61 | HR 68 | Temp 98.1°F | Resp 18

## 2014-02-13 DIAGNOSIS — C9000 Multiple myeloma not having achieved remission: Secondary | ICD-10-CM

## 2014-02-13 DIAGNOSIS — Z5112 Encounter for antineoplastic immunotherapy: Secondary | ICD-10-CM

## 2014-02-13 MED ORDER — BORTEZOMIB CHEMO SQ INJECTION 3.5 MG (2.5MG/ML)
1.0000 mg/m2 | Freq: Once | INTRAMUSCULAR | Status: AC
  Administered 2014-02-13: 1.75 mg via SUBCUTANEOUS
  Filled 2014-02-13: qty 1.75

## 2014-02-13 MED ORDER — ONDANSETRON HCL 8 MG PO TABS
8.0000 mg | ORAL_TABLET | Freq: Once | ORAL | Status: AC
  Administered 2014-02-13: 8 mg via ORAL

## 2014-02-13 MED ORDER — ONDANSETRON HCL 8 MG PO TABS
ORAL_TABLET | ORAL | Status: AC
  Filled 2014-02-13: qty 1

## 2014-02-13 NOTE — Telephone Encounter (Signed)
Pt left a message asking about an appointment on 9/4 for treatment? Is having swelling and pain in her legs- can she take Aleve or Advil? And when she starts Revlimid, can she take Lactaid?

## 2014-02-13 NOTE — Patient Instructions (Signed)
[facility] Discharge Instructions for Patients Receiving Chemotherapy  Today you received the following chemotherapy agent: Velcade   To help prevent nausea and vomiting after your treatment, we encourage you to take your nausea medication as prescribed.    If you develop nausea and vomiting that is not controlled by your nausea medication, call the clinic.   BELOW ARE SYMPTOMS THAT SHOULD BE REPORTED IMMEDIATELY:  *FEVER GREATER THAN 100.5 F  *CHILLS WITH OR WITHOUT FEVER  NAUSEA AND VOMITING THAT IS NOT CONTROLLED WITH YOUR NAUSEA MEDICATION  *UNUSUAL SHORTNESS OF BREATH  *UNUSUAL BRUISING OR BLEEDING  TENDERNESS IN MOUTH AND THROAT WITH OR WITHOUT PRESENCE OF ULCERS  *URINARY PROBLEMS  *BOWEL PROBLEMS  UNUSUAL RASH Items with * indicate a potential emergency and should be followed up as soon as possible.  Feel free to call the clinic you have any questions or concerns. The clinic phone number is [phone].

## 2014-02-13 NOTE — Telephone Encounter (Signed)
Per desk RN I have scheduled appts

## 2014-02-13 NOTE — Telephone Encounter (Signed)
Spoke with patient. Message to scheduler to add appts for 9/4 and 9/11. Per Dr Alvy Bimler, it is OK to take advil or aleve. And patient can take lactaid with revlimid

## 2014-02-17 ENCOUNTER — Other Ambulatory Visit: Payer: Self-pay | Admitting: *Deleted

## 2014-02-17 DIAGNOSIS — C9000 Multiple myeloma not having achieved remission: Secondary | ICD-10-CM

## 2014-02-17 MED ORDER — LENALIDOMIDE 10 MG PO CAPS: 10.0000 mg | ORAL_CAPSULE | Freq: Every day | ORAL | Status: DC

## 2014-02-17 NOTE — Telephone Encounter (Signed)
THIS REFILL REQUEST FOR REVLIMID WAS PLACED ON DR.GORSUCH'S DESK. 

## 2014-02-17 NOTE — Addendum Note (Signed)
Addended by: Wyonia Hough on: 02/17/2014 03:49 PM   Modules accepted: Orders

## 2014-02-19 ENCOUNTER — Telehealth: Payer: Self-pay | Admitting: *Deleted

## 2014-02-19 NOTE — Telephone Encounter (Signed)
Call from [facility].  They report pt has #8 tabs of Revlmid left over from last cycle which she stopped early.  They ask if ok to only order #13 tabs for this cycle to equal total #21 tabs.  Gave verbal order from Dr. Alvy Bimler ok to dispense #13 pills for total of 21 pills for pt this cycle.

## 2014-02-24 ENCOUNTER — Telehealth: Payer: Self-pay | Admitting: Hematology and Oncology

## 2014-02-24 ENCOUNTER — Ambulatory Visit (HOSPITAL_BASED_OUTPATIENT_CLINIC_OR_DEPARTMENT_OTHER): Payer: Medicare HMO | Admitting: Hematology and Oncology

## 2014-02-24 ENCOUNTER — Ambulatory Visit (HOSPITAL_BASED_OUTPATIENT_CLINIC_OR_DEPARTMENT_OTHER): Payer: Medicare HMO

## 2014-02-24 ENCOUNTER — Other Ambulatory Visit (HOSPITAL_BASED_OUTPATIENT_CLINIC_OR_DEPARTMENT_OTHER): Payer: Medicare HMO

## 2014-02-24 VITALS — BP 129/64 | HR 74 | Temp 98.7°F | Resp 18 | Ht 63.0 in | Wt 148.7 lb

## 2014-02-24 DIAGNOSIS — C9000 Multiple myeloma not having achieved remission: Secondary | ICD-10-CM

## 2014-02-24 DIAGNOSIS — Z5111 Encounter for antineoplastic chemotherapy: Secondary | ICD-10-CM

## 2014-02-24 DIAGNOSIS — G620 Drug-induced polyneuropathy: Secondary | ICD-10-CM

## 2014-02-24 DIAGNOSIS — G622 Polyneuropathy due to other toxic agents: Secondary | ICD-10-CM

## 2014-02-24 DIAGNOSIS — Z23 Encounter for immunization: Secondary | ICD-10-CM

## 2014-02-24 DIAGNOSIS — D630 Anemia in neoplastic disease: Secondary | ICD-10-CM

## 2014-02-24 DIAGNOSIS — Z5112 Encounter for antineoplastic immunotherapy: Secondary | ICD-10-CM

## 2014-02-24 DIAGNOSIS — R21 Rash and other nonspecific skin eruption: Secondary | ICD-10-CM

## 2014-02-24 DIAGNOSIS — T451X5A Adverse effect of antineoplastic and immunosuppressive drugs, initial encounter: Secondary | ICD-10-CM

## 2014-02-24 LAB — COMPREHENSIVE METABOLIC PANEL (CC13)
ALT: 11 U/L (ref 0–55)
AST: 11 U/L (ref 5–34)
Albumin: 3.4 g/dL — ABNORMAL LOW (ref 3.5–5.0)
Alkaline Phosphatase: 41 U/L (ref 40–150)
Anion Gap: 8 mEq/L (ref 3–11)
BUN: 14.7 mg/dL (ref 7.0–26.0)
CO2: 24 mEq/L (ref 22–29)
Calcium: 8.9 mg/dL (ref 8.4–10.4)
Chloride: 107 mEq/L (ref 98–109)
Creatinine: 0.8 mg/dL (ref 0.6–1.1)
Glucose: 108 mg/dl (ref 70–140)
Potassium: 3.8 mEq/L (ref 3.5–5.1)
Sodium: 140 mEq/L (ref 136–145)
Total Bilirubin: 0.28 mg/dL (ref 0.20–1.20)
Total Protein: 8.1 g/dL (ref 6.4–8.3)

## 2014-02-24 LAB — CBC WITH DIFFERENTIAL/PLATELET
BASO%: 0.1 % (ref 0.0–2.0)
Basophils Absolute: 0 10*3/uL (ref 0.0–0.1)
EOS%: 0 % (ref 0.0–7.0)
Eosinophils Absolute: 0 10*3/uL (ref 0.0–0.5)
HCT: 31.6 % — ABNORMAL LOW (ref 34.8–46.6)
HGB: 10.3 g/dL — ABNORMAL LOW (ref 11.6–15.9)
LYMPH%: 19.7 % (ref 14.0–49.7)
MCH: 32.9 pg (ref 25.1–34.0)
MCHC: 32.6 g/dL (ref 31.5–36.0)
MCV: 101 fL (ref 79.5–101.0)
MONO#: 1.5 10*3/uL — ABNORMAL HIGH (ref 0.1–0.9)
MONO%: 16.5 % — ABNORMAL HIGH (ref 0.0–14.0)
NEUT#: 5.7 10*3/uL (ref 1.5–6.5)
NEUT%: 63.7 % (ref 38.4–76.8)
Platelets: 146 10*3/uL (ref 145–400)
RBC: 3.13 10*6/uL — ABNORMAL LOW (ref 3.70–5.45)
RDW: 14.9 % — ABNORMAL HIGH (ref 11.2–14.5)
WBC: 8.9 10*3/uL (ref 3.9–10.3)
lymph#: 1.8 10*3/uL (ref 0.9–3.3)

## 2014-02-24 MED ORDER — ONDANSETRON HCL 8 MG PO TABS
8.0000 mg | ORAL_TABLET | Freq: Once | ORAL | Status: AC
  Administered 2014-02-24: 8 mg via ORAL

## 2014-02-24 MED ORDER — ONDANSETRON HCL 8 MG PO TABS
ORAL_TABLET | ORAL | Status: AC
  Filled 2014-02-24: qty 1

## 2014-02-24 MED ORDER — BORTEZOMIB CHEMO SQ INJECTION 3.5 MG (2.5MG/ML)
1.0000 mg/m2 | Freq: Once | INTRAMUSCULAR | Status: AC
  Administered 2014-02-24: 1.75 mg via SUBCUTANEOUS
  Filled 2014-02-24: qty 1.75

## 2014-02-24 MED ORDER — SODIUM CHLORIDE 0.9 % IV SOLN
Freq: Once | INTRAVENOUS | Status: AC
  Administered 2014-02-24: 10:00:00 via INTRAVENOUS

## 2014-02-24 MED ORDER — ZOLEDRONIC ACID 4 MG/100ML IV SOLN
4.0000 mg | Freq: Once | INTRAVENOUS | Status: AC
  Administered 2014-02-24: 4 mg via INTRAVENOUS
  Filled 2014-02-24: qty 100

## 2014-02-24 MED ORDER — INFLUENZA VAC SPLIT QUAD 0.5 ML IM SUSY
0.5000 mL | PREFILLED_SYRINGE | Freq: Once | INTRAMUSCULAR | Status: AC
  Administered 2014-02-24: 0.5 mL via INTRAMUSCULAR
  Filled 2014-02-24: qty 0.5

## 2014-02-24 NOTE — Assessment & Plan Note (Signed)
The patient only had minor partial response with chemotherapy recently and decision was made to add Revlimid. I plan to recheck a serum protein electrophoresis in light chains studies next week to assess response to treatment. So far she is doing very well. I will start to taper dexamethasone to 20 mg weekly and to reduce by 1 tablet monthly. She will continue on monthly Zometa. So far she had no side effects from treatment. She had her dental visit recently with no complications. She is getting WORSENING peripheral neuropathy. I will change her Velcade injection to weekly maintenance dose.

## 2014-02-24 NOTE — Assessment & Plan Note (Signed)
This is likely due to recent treatment. The patient denies recent history of bleeding such as epistaxis, hematuria or hematochezia. She is asymptomatic from the anemia. I will observe for now.  She does not require transfusion now. I will continue the chemotherapy at current dose without dosage adjustment.  If the anemia gets progressive worse in the future, I might have to delay her treatment or adjust the chemotherapy dose.  

## 2014-02-24 NOTE — Progress Notes (Signed)
[facility] OFFICE PROGRESS NOTE  Patient Care Team: Jinny Sanders, MD as PCP - General (Family Medicine) [name], MD as Referring Physician (Hematology and Oncology)  SUMMARY OF ONCOLOGIC HISTORY: Oncology History   Multiple myeloma, Ig A Lambda, M spike 3.54 grams, Calcium 9.2, Creatinine 0.8, Beta 2 microglobulin 4.52, IgA 4840 mg/dL, lambda light chain 75.4, albumin 3.6, hemoglobin 9.7, platelet 115    Primary site: Multiple Myeloma   Staging method: AJCC 6th Edition   Clinical: Stage IIA signed by [name], MD on 11/07/2013  2:46 PM   Summary: Stage IIA        Multiple myeloma, without mention of having achieved remission   10/31/2013 Bone Marrow Biopsy Bone marrow biopsy confirmed multiple myeloma with 40% bone marrow involvement. Skeletal survey showed minimal lesions in her score with generalized demineralization   11/10/2013 -  Chemotherapy The patient is started on induction chemotherapy with weekly dexamethasone 40 mg by mouth as well as Velcade subcutaneous injection on days 1, 4, 8 and 11. On 11/21/2013, she was started on monthly Zometa.   12/23/2013 Adverse Reaction The dose of Velcade was reduced due to thrombocytopenia.   01/28/2014 -  Chemotherapy Revlimid is added    INTERVAL HISTORY: Please see below for problem oriented charting. She is seen prior to new cycle of chemotherapy. She is starting to get peripheral neuropathy. She denies new bone pain. She had mild skin rash at the site of injection.  REVIEW OF SYSTEMS:   Constitutional: Denies fevers, chills or abnormal weight loss Eyes: Denies blurriness of vision Ears, nose, mouth, throat, and face: Denies mucositis or sore throat Respiratory: Denies cough, dyspnea or wheezes Cardiovascular: Denies palpitation, chest discomfort or lower extremity swelling Gastrointestinal:  Denies nausea, heartburn or change in bowel habits  Lymphatics: Denies new lymphadenopathy or easy  bruising Neurological:Denies numbness, tingling or new weaknesses Behavioral/Psych: Mood is stable, no new changes  All other systems were reviewed with the patient and are negative.  I have reviewed the past medical history, past surgical history, social history and family history with the patient and they are unchanged from previous note.  ALLERGIES:  is allergic to penicillins and sulfa antibiotics.  MEDICATIONS:  Current Outpatient Prescriptions  Medication Sig Dispense Refill  . acyclovir (ZOVIRAX) 400 MG tablet Take 1 tablet (400 mg total) by mouth 2 (two) times daily.  60 tablet  3  . ALPRAZolam (XANAX) 0.5 MG tablet Take 1 tablet (0.5 mg total) by mouth 2 (two) times daily as needed for anxiety.  30 tablet  0  . carvedilol (COREG) 6.25 MG tablet Take 0.5 tablets (3.125 mg total) by mouth 2 (two) times daily with a meal.  90 tablet  3  . cholecalciferol (VITAMIN D) 1000 UNITS tablet Take 1,000 Units by mouth daily.      Marland Kitchen dexamethasone (DECADRON) 4 MG tablet Take 10 tablets (40 mg total) by mouth once a week.  100 tablet  1  . docusate sodium (COLACE) 100 MG capsule Take 100 mg by mouth 2 (two) times daily.      Marland Kitchen lenalidomide (REVLIMID) 10 MG capsule Take 1 capsule (10 mg total) by mouth daily.  21 capsule  0  . loperamide (IMODIUM A-D) 2 MG tablet Take 2 mg by mouth 4 (four) times daily as needed for diarrhea or loose stools.      . mirtazapine (REMERON) 15 MG tablet TAKE 1 TABLET BY MOUTH AT BEDTIME  30 tablet  0  .  mirtazapine (REMERON) 30 MG tablet Take 1 tablet (30 mg total) by mouth at bedtime.  60 tablet  1  . Multiple Vitamins-Minerals (CENTRUM SILVER ULTRA WOMENS PO) Take by mouth daily.       No current facility-administered medications for this visit.    PHYSICAL EXAMINATION: ECOG PERFORMANCE STATUS: 0 - Asymptomatic  Filed Vitals:   02/24/14 0859  BP: 129/64  Pulse: 74  Temp: 98.7 F (37.1 C)  Resp: 18   Filed Weights   02/24/14 0859  Weight: 148 lb 11.2 oz  (67.45 kg)    GENERAL:alert, no distress and comfortable SKIN: Mild skin rash injection site. EYES: normal, Conjunctiva are pink and non-injected, sclera clear OROPHARYNX:no exudate, no erythema and lips, buccal mucosa, and tongue normal  NECK: supple, thyroid normal size, non-tender, without nodularity LYMPH:  no palpable lymphadenopathy in the cervical, axillary or inguinal LUNGS: clear to auscultation and percussion with normal breathing effort HEART: regular rate & rhythm and no murmurs and no lower extremity edema ABDOMEN:abdomen soft, non-tender and normal bowel sounds Musculoskeletal:no cyanosis of digits and no clubbing  NEURO: alert & oriented x 3 with fluent speech, no focal motor/sensory deficits  LABORATORY DATA:  I have reviewed the data as listed    Component Value Date/Time   NA 140 02/24/2014 0847   NA 141 02/03/2014 0909   K 3.8 02/24/2014 0847   K 3.8 02/03/2014 0909   CL 101 02/03/2014 0909   CO2 24 02/24/2014 0847   CO2 25 02/03/2014 0909   GLUCOSE 108 02/24/2014 0847   GLUCOSE 108* 02/03/2014 0909   BUN 14.7 02/24/2014 0847   BUN 18 02/03/2014 0909   CREATININE 0.8 02/24/2014 0847   CREATININE 0.68 02/03/2014 0909   CALCIUM 8.9 02/24/2014 0847   CALCIUM 9.2 02/03/2014 0909   PROT 8.1 02/24/2014 0847   PROT 8.5* 02/03/2014 0909   ALBUMIN 3.4* 02/24/2014 0847   ALBUMIN 3.5 02/03/2014 0909   AST 11 02/24/2014 0847   AST 14 02/03/2014 0909   ALT 11 02/24/2014 0847   ALT 12 02/03/2014 0909   ALKPHOS 41 02/24/2014 0847   ALKPHOS 53 02/03/2014 0909   BILITOT 0.28 02/24/2014 0847   BILITOT <0.2* 02/03/2014 0909    No results found for this basename: SPEP, UPEP,  kappa and lambda light chains    Lab Results  Component Value Date   WBC 8.9 02/24/2014   NEUTROABS 5.7 02/24/2014   HGB 10.3* 02/24/2014   HCT 31.6* 02/24/2014   MCV 101.0 02/24/2014   PLT 146 02/24/2014      Chemistry      Component Value Date/Time   NA 140 02/24/2014 0847   NA 141 02/03/2014 0909   K 3.8 02/24/2014 0847   K 3.8  02/03/2014 0909   CL 101 02/03/2014 0909   CO2 24 02/24/2014 0847   CO2 25 02/03/2014 0909   BUN 14.7 02/24/2014 0847   BUN 18 02/03/2014 0909   CREATININE 0.8 02/24/2014 0847   CREATININE 0.68 02/03/2014 0909      Component Value Date/Time   CALCIUM 8.9 02/24/2014 0847   CALCIUM 9.2 02/03/2014 0909   ALKPHOS 41 02/24/2014 0847   ALKPHOS 53 02/03/2014 0909   AST 11 02/24/2014 0847   AST 14 02/03/2014 0909   ALT 11 02/24/2014 0847   ALT 12 02/03/2014 0909   BILITOT 0.28 02/24/2014 0847   BILITOT <0.2* 02/03/2014 0909      ASSESSMENT & PLAN:  Multiple myeloma, without mention of having  achieved remission The patient only had minor partial response with chemotherapy recently and decision was made to add Revlimid. I plan to recheck a serum protein electrophoresis in light chains studies next week to assess response to treatment. So far she is doing very well. I will start to taper dexamethasone to 20 mg weekly and to reduce by 1 tablet monthly. She will continue on monthly Zometa. So far she had no side effects from treatment. She had her dental visit recently with no complications. She is getting WORSENING peripheral neuropathy. I will change her Velcade injection to weekly maintenance dose.    Rash This is minor, related to side effects of treatment. I recommended close observation.      Neuropathy due to chemotherapeutic drug I plan to change her Velcade to weekly injection.  Anemia in neoplastic disease This is likely due to recent treatment. The patient denies recent history of bleeding such as epistaxis, hematuria or hematochezia. She is asymptomatic from the anemia. I will observe for now.  She does not require transfusion now. I will continue the chemotherapy at current dose without dosage adjustment.  If the anemia gets progressive worse in the future, I might have to delay her treatment or adjust the chemotherapy dose.   Need for prophylactic vaccination and inoculation against  influenza I will proceed with influenza vaccination today.   No orders of the defined types were placed in this encounter.   All questions were answered. The patient knows to call the clinic with any problems, questions or concerns. No barriers to learning was detected. I spent 30 minutes counseling the patient face to face. The total time spent in the appointment was 40 minutes and more than 50% was on counseling and review of test results     [facility], [place], MD 02/24/2014 9:47 AM

## 2014-02-24 NOTE — Assessment & Plan Note (Signed)
This is minor, related to side effects of treatment. I recommended close observation.

## 2014-02-24 NOTE — Assessment & Plan Note (Signed)
I plan to change her Velcade to weekly injection.

## 2014-02-24 NOTE — Telephone Encounter (Signed)
gv and printed appt sched adn avs for pt fro Sept...sed added tx

## 2014-02-24 NOTE — Telephone Encounter (Signed)
RECEIVED A FAX FROM [facility] CONCERNING A CONFIRMATION OF PRESCRIPTION SHIPMENT FOR REVLIMID ON 02/23/14.

## 2014-02-24 NOTE — Patient Instructions (Addendum)
[facility] Discharge Instructions for Patients Receiving Chemotherapy  Today you received the following chemotherapy agents Velcade and Zometa  To help prevent nausea and vomiting after your treatment, we encourage you to take your nausea medication as prescribed.   If you develop nausea and vomiting that is not controlled by your nausea medication, call the clinic.   BELOW ARE SYMPTOMS THAT SHOULD BE REPORTED IMMEDIATELY:  *FEVER GREATER THAN 100.5 F  *CHILLS WITH OR WITHOUT FEVER  NAUSEA AND VOMITING THAT IS NOT CONTROLLED WITH YOUR NAUSEA MEDICATION  *UNUSUAL SHORTNESS OF BREATH  *UNUSUAL BRUISING OR BLEEDING  TENDERNESS IN MOUTH AND THROAT WITH OR WITHOUT PRESENCE OF ULCERS  *URINARY PROBLEMS  *BOWEL PROBLEMS  UNUSUAL RASH Items with * indicate a potential emergency and should be followed up as soon as possible.  Feel free to call the clinic you have any questions or concerns. The clinic phone number is [phone].   Influenza Virus Vaccine injection What is this medicine? INFLUENZA VIRUS VACCINE (in floo EN zuh VAHY ruhs vak SEEN) helps to reduce the risk of getting influenza also known as the flu. The vaccine only helps protect you against some strains of the flu. This medicine may be used for other purposes; ask your health care provider or pharmacist if you have questions. COMMON BRAND NAME(S): Afluria, Agriflu, Fluarix, Fluarix Quadrivalent, FLUCELVAX, Flulaval, Fluvirin, Fluzone, Fluzone High-Dose, Fluzone Intradermal What should I tell my health care provider before I take this medicine? They need to know if you have any of these conditions: -bleeding disorder like hemophilia -fever or infection -Guillain-Barre syndrome or other neurological problems -immune system problems -infection with the human immunodeficiency virus (HIV) or AIDS -low blood platelet counts -multiple sclerosis -an unusual or allergic reaction to influenza virus  vaccine, latex, other medicines, foods, dyes, or preservatives. Different brands of vaccines contain different allergens. Some may contain latex or eggs. Talk to your doctor about your allergies to make sure that you get the right vaccine. -pregnant or trying to get pregnant -breast-feeding How should I use this medicine? This vaccine is for injection into a muscle or under the skin. It is given by a health care professional. A copy of Vaccine Information Statements will be given before each vaccination. Read this sheet carefully each time. The sheet may change frequently. Talk to your healthcare provider to see which vaccines are right for you. Some vaccines should not be used in all age groups. Overdosage: If you think you have taken too much of this medicine contact a poison control center or emergency room at once. NOTE: This medicine is only for you. Do not share this medicine with others. What if I miss a dose? This does not apply. What may interact with this medicine? -chemotherapy or radiation therapy -medicines that lower your immune system like etanercept, anakinra, infliximab, and adalimumab -medicines that treat or prevent blood clots like warfarin -phenytoin -steroid medicines like prednisone or cortisone -theophylline -vaccines This list may not describe all possible interactions. Give your health care provider a list of all the medicines, herbs, non-prescription drugs, or dietary supplements you use. Also tell them if you smoke, drink alcohol, or use illegal drugs. Some items may interact with your medicine. What should I watch for while using this medicine? Report any side effects that do not go away within 3 days to your doctor or health care professional. Call your health care provider if any unusual symptoms occur within 6 weeks of receiving this vaccine. You  may still catch the flu, but the illness is not usually as bad. You cannot get the flu from the vaccine. The vaccine  will not protect against colds or other illnesses that may cause fever. The vaccine is needed every year. What side effects may I notice from receiving this medicine? Side effects that you should report to your doctor or health care professional as soon as possible: -allergic reactions like skin rash, itching or hives, swelling of the face, lips, or tongue Side effects that usually do not require medical attention (report to your doctor or health care professional if they continue or are bothersome): -fever -headache -muscle aches and pains -pain, tenderness, redness, or swelling at the injection site -tiredness This list may not describe all possible side effects. Call your doctor for medical advice about side effects. You may report side effects to FDA at 1-800-FDA-1088. Where should I keep my medicine? The vaccine will be given by a health care professional in a clinic, pharmacy, doctor's office, or other health care setting. You will not be given vaccine doses to store at home. NOTE: This sheet is a summary. It may not cover all possible information. If you have questions about this medicine, talk to your doctor, pharmacist, or health care provider.  2015, Elsevier/Gold Standard. (2011-12-21 27:61:47)

## 2014-02-24 NOTE — Assessment & Plan Note (Signed)
I will proceed with influenza vaccination today.

## 2014-02-27 ENCOUNTER — Ambulatory Visit: Payer: Medicare HMO

## 2014-03-03 ENCOUNTER — Other Ambulatory Visit: Payer: Self-pay | Admitting: Hematology and Oncology

## 2014-03-03 ENCOUNTER — Other Ambulatory Visit (HOSPITAL_BASED_OUTPATIENT_CLINIC_OR_DEPARTMENT_OTHER): Payer: Medicare HMO

## 2014-03-03 ENCOUNTER — Ambulatory Visit (HOSPITAL_BASED_OUTPATIENT_CLINIC_OR_DEPARTMENT_OTHER): Payer: Medicare HMO

## 2014-03-03 ENCOUNTER — Ambulatory Visit: Payer: Medicare HMO

## 2014-03-03 VITALS — BP 138/67 | HR 64 | Temp 98.0°F | Resp 18

## 2014-03-03 DIAGNOSIS — G620 Drug-induced polyneuropathy: Secondary | ICD-10-CM

## 2014-03-03 DIAGNOSIS — Z5112 Encounter for antineoplastic immunotherapy: Secondary | ICD-10-CM

## 2014-03-03 DIAGNOSIS — T451X5A Adverse effect of antineoplastic and immunosuppressive drugs, initial encounter: Secondary | ICD-10-CM

## 2014-03-03 DIAGNOSIS — C9000 Multiple myeloma not having achieved remission: Secondary | ICD-10-CM

## 2014-03-03 LAB — COMPREHENSIVE METABOLIC PANEL (CC13)
ALT: 7 U/L (ref 0–55)
AST: 14 U/L (ref 5–34)
Albumin: 3.4 g/dL — ABNORMAL LOW (ref 3.5–5.0)
Alkaline Phosphatase: 40 U/L (ref 40–150)
Anion Gap: 8 mEq/L (ref 3–11)
BUN: 9.3 mg/dL (ref 7.0–26.0)
CO2: 28 mEq/L (ref 22–29)
Calcium: 9.2 mg/dL (ref 8.4–10.4)
Chloride: 105 mEq/L (ref 98–109)
Creatinine: 0.8 mg/dL (ref 0.6–1.1)
Glucose: 91 mg/dl (ref 70–140)
Potassium: 4.3 mEq/L (ref 3.5–5.1)
Sodium: 141 mEq/L (ref 136–145)
Total Bilirubin: 0.34 mg/dL (ref 0.20–1.20)
Total Protein: 7.9 g/dL (ref 6.4–8.3)

## 2014-03-03 LAB — CBC WITH DIFFERENTIAL/PLATELET
BASO%: 0.3 % (ref 0.0–2.0)
Basophils Absolute: 0 10*3/uL (ref 0.0–0.1)
EOS%: 5.7 % (ref 0.0–7.0)
Eosinophils Absolute: 0.4 10*3/uL (ref 0.0–0.5)
HCT: 33.9 % — ABNORMAL LOW (ref 34.8–46.6)
HGB: 10.7 g/dL — ABNORMAL LOW (ref 11.6–15.9)
LYMPH%: 30.1 % (ref 14.0–49.7)
MCH: 32.1 pg (ref 25.1–34.0)
MCHC: 31.6 g/dL (ref 31.5–36.0)
MCV: 101.8 fL — ABNORMAL HIGH (ref 79.5–101.0)
MONO#: 1.1 10*3/uL — ABNORMAL HIGH (ref 0.1–0.9)
MONO%: 15.7 % — ABNORMAL HIGH (ref 0.0–14.0)
NEUT#: 3.5 10*3/uL (ref 1.5–6.5)
NEUT%: 48.2 % (ref 38.4–76.8)
Platelets: 131 10*3/uL — ABNORMAL LOW (ref 145–400)
RBC: 3.33 10*6/uL — ABNORMAL LOW (ref 3.70–5.45)
RDW: 14.9 % — ABNORMAL HIGH (ref 11.2–14.5)
WBC: 7.2 10*3/uL (ref 3.9–10.3)
lymph#: 2.2 10*3/uL (ref 0.9–3.3)

## 2014-03-03 LAB — TECHNOLOGIST REVIEW: Technologist Review: 1

## 2014-03-03 MED ORDER — ONDANSETRON HCL 8 MG PO TABS
ORAL_TABLET | ORAL | Status: AC
  Filled 2014-03-03: qty 1

## 2014-03-03 MED ORDER — ONDANSETRON HCL 8 MG PO TABS
8.0000 mg | ORAL_TABLET | Freq: Once | ORAL | Status: AC
  Administered 2014-03-03: 8 mg via ORAL

## 2014-03-03 MED ORDER — BORTEZOMIB CHEMO SQ INJECTION 3.5 MG (2.5MG/ML)
1.0000 mg/m2 | Freq: Once | INTRAMUSCULAR | Status: AC
  Administered 2014-03-03: 1.75 mg via SUBCUTANEOUS
  Filled 2014-03-03: qty 1.75

## 2014-03-03 NOTE — Patient Instructions (Signed)
[facility] Discharge Instructions for Patients Receiving Chemotherapy  Today you received the following chemotherapy agents: Velcade  To help prevent nausea and vomiting after your treatment, we encourage you to take your nausea medication as prescribed by your physician.   If you develop nausea and vomiting that is not controlled by your nausea medication, call the clinic.   BELOW ARE SYMPTOMS THAT SHOULD BE REPORTED IMMEDIATELY:  *FEVER GREATER THAN 100.5 F  *CHILLS WITH OR WITHOUT FEVER  NAUSEA AND VOMITING THAT IS NOT CONTROLLED WITH YOUR NAUSEA MEDICATION  *UNUSUAL SHORTNESS OF BREATH  *UNUSUAL BRUISING OR BLEEDING  TENDERNESS IN MOUTH AND THROAT WITH OR WITHOUT PRESENCE OF ULCERS  *URINARY PROBLEMS  *BOWEL PROBLEMS  UNUSUAL RASH Items with * indicate a potential emergency and should be followed up as soon as possible.  Feel free to call the clinic you have any questions or concerns. The clinic phone number is [phone].    

## 2014-03-05 ENCOUNTER — Other Ambulatory Visit: Payer: Self-pay | Admitting: Hematology and Oncology

## 2014-03-05 ENCOUNTER — Telehealth: Payer: Self-pay | Admitting: *Deleted

## 2014-03-05 LAB — KAPPA/LAMBDA LIGHT CHAINS
Kappa free light chain: 0.23 mg/dL — ABNORMAL LOW (ref 0.33–1.94)
Kappa:Lambda Ratio: 0 — ABNORMAL LOW (ref 0.26–1.65)
Lambda Free Lght Chn: 58.4 mg/dL — ABNORMAL HIGH (ref 0.57–2.63)

## 2014-03-05 LAB — SPEP & IFE WITH QIG
Albumin ELP: 48.4 % — ABNORMAL LOW (ref 55.8–66.1)
Alpha-1-Globulin: 2.7 % — ABNORMAL LOW (ref 2.9–4.9)
Alpha-2-Globulin: 5.7 % — ABNORMAL LOW (ref 7.1–11.8)
Beta 2: 33 % — ABNORMAL HIGH (ref 3.2–6.5)
Beta Globulin: 7 % (ref 4.7–7.2)
Gamma Globulin: 3.2 % — ABNORMAL LOW (ref 11.1–18.8)
IgA: 2940 mg/dL — ABNORMAL HIGH (ref 69–380)
IgG (Immunoglobin G), Serum: 263 mg/dL — ABNORMAL LOW (ref 690–1700)
IgM, Serum: <5 mg/dL — ABNORMAL LOW (ref 52–322)
M-Spike, %: 2.24 g/dL
Total Protein, Serum Electrophoresis: 7.6 g/dL (ref 6.0–8.3)

## 2014-03-05 LAB — BETA 2 MICROGLOBULIN, SERUM: Beta-2 Microglobulin: 4.57 mg/L — ABNORMAL HIGH (ref <=2.51)

## 2014-03-06 ENCOUNTER — Ambulatory Visit: Payer: Medicare HMO

## 2014-03-06 NOTE — Telephone Encounter (Signed)
Pt requests results of her "myeloma labs."  States results are not on My Chart.  Copy of labs left for pt to pick up at front desk. She verbalized understanding.

## 2014-03-10 ENCOUNTER — Ambulatory Visit (HOSPITAL_BASED_OUTPATIENT_CLINIC_OR_DEPARTMENT_OTHER): Payer: Medicare HMO

## 2014-03-10 ENCOUNTER — Other Ambulatory Visit: Payer: Self-pay | Admitting: Hematology and Oncology

## 2014-03-10 ENCOUNTER — Other Ambulatory Visit (HOSPITAL_BASED_OUTPATIENT_CLINIC_OR_DEPARTMENT_OTHER): Payer: Medicare HMO

## 2014-03-10 VITALS — BP 129/74 | HR 66 | Temp 99.4°F | Resp 18

## 2014-03-10 DIAGNOSIS — C9000 Multiple myeloma not having achieved remission: Secondary | ICD-10-CM

## 2014-03-10 DIAGNOSIS — G620 Drug-induced polyneuropathy: Secondary | ICD-10-CM

## 2014-03-10 DIAGNOSIS — T451X5A Adverse effect of antineoplastic and immunosuppressive drugs, initial encounter: Secondary | ICD-10-CM

## 2014-03-10 DIAGNOSIS — Z5112 Encounter for antineoplastic immunotherapy: Secondary | ICD-10-CM

## 2014-03-10 LAB — COMPREHENSIVE METABOLIC PANEL (CC13)
ALT: 7 U/L (ref 0–55)
AST: 13 U/L (ref 5–34)
Albumin: 3.3 g/dL — ABNORMAL LOW (ref 3.5–5.0)
Alkaline Phosphatase: 36 U/L — ABNORMAL LOW (ref 40–150)
Anion Gap: 8 mEq/L (ref 3–11)
BUN: 11 mg/dL (ref 7.0–26.0)
CO2: 27 mEq/L (ref 22–29)
Calcium: 8.4 mg/dL (ref 8.4–10.4)
Chloride: 106 mEq/L (ref 98–109)
Creatinine: 0.7 mg/dL (ref 0.6–1.1)
Glucose: 82 mg/dl (ref 70–140)
Potassium: 4.1 mEq/L (ref 3.5–5.1)
Sodium: 141 mEq/L (ref 136–145)
Total Bilirubin: 0.35 mg/dL (ref 0.20–1.20)
Total Protein: 7.6 g/dL (ref 6.4–8.3)

## 2014-03-10 LAB — CBC WITH DIFFERENTIAL/PLATELET
BASO%: 0.3 % (ref 0.0–2.0)
Basophils Absolute: 0 10*3/uL (ref 0.0–0.1)
EOS%: 4.7 % (ref 0.0–7.0)
Eosinophils Absolute: 0.3 10*3/uL (ref 0.0–0.5)
HCT: 32.8 % — ABNORMAL LOW (ref 34.8–46.6)
HGB: 10.8 g/dL — ABNORMAL LOW (ref 11.6–15.9)
LYMPH%: 28.7 % (ref 14.0–49.7)
MCH: 32.6 pg (ref 25.1–34.0)
MCHC: 32.9 g/dL (ref 31.5–36.0)
MCV: 99 fL (ref 79.5–101.0)
MONO#: 1.3 10*3/uL — ABNORMAL HIGH (ref 0.1–0.9)
MONO%: 20 % — ABNORMAL HIGH (ref 0.0–14.0)
NEUT#: 3.1 10*3/uL (ref 1.5–6.5)
NEUT%: 46.3 % (ref 38.4–76.8)
Platelets: 67 10*3/uL — ABNORMAL LOW (ref 145–400)
RBC: 3.32 10*6/uL — ABNORMAL LOW (ref 3.70–5.45)
RDW: 14.9 % — ABNORMAL HIGH (ref 11.2–14.5)
WBC: 6.7 10*3/uL (ref 3.9–10.3)
lymph#: 1.9 10*3/uL (ref 0.9–3.3)

## 2014-03-10 MED ORDER — ONDANSETRON HCL 8 MG PO TABS
ORAL_TABLET | ORAL | Status: AC
  Filled 2014-03-10: qty 1

## 2014-03-10 MED ORDER — BORTEZOMIB CHEMO SQ INJECTION 3.5 MG (2.5MG/ML)
1.0000 mg/m2 | Freq: Once | INTRAMUSCULAR | Status: AC
  Administered 2014-03-10: 1.75 mg via SUBCUTANEOUS
  Filled 2014-03-10: qty 1.75

## 2014-03-10 MED ORDER — ONDANSETRON HCL 8 MG PO TABS
8.0000 mg | ORAL_TABLET | Freq: Once | ORAL | Status: AC
  Administered 2014-03-10: 8 mg via ORAL

## 2014-03-10 NOTE — Progress Notes (Signed)
Per Dr.Gorsuch, may treat for plts of 67

## 2014-03-10 NOTE — Patient Instructions (Signed)
[facility] Discharge Instructions for Patients Receiving Chemotherapy  Today you received the following chemotherapy agents: Velcade.  To help prevent nausea and vomiting after your treatment, we encourage you to take your nausea medication as prescribed.   If you develop nausea and vomiting that is not controlled by your nausea medication, call the clinic.   BELOW ARE SYMPTOMS THAT SHOULD BE REPORTED IMMEDIATELY:  *FEVER GREATER THAN 100.5 F  *CHILLS WITH OR WITHOUT FEVER  NAUSEA AND VOMITING THAT IS NOT CONTROLLED WITH YOUR NAUSEA MEDICATION  *UNUSUAL SHORTNESS OF BREATH  *UNUSUAL BRUISING OR BLEEDING  TENDERNESS IN MOUTH AND THROAT WITH OR WITHOUT PRESENCE OF ULCERS  *URINARY PROBLEMS  *BOWEL PROBLEMS  UNUSUAL RASH Items with * indicate a potential emergency and should be followed up as soon as possible.  Feel free to call the clinic you have any questions or concerns. The clinic phone number is [phone].    

## 2014-03-13 ENCOUNTER — Other Ambulatory Visit: Payer: Self-pay | Admitting: *Deleted

## 2014-03-13 DIAGNOSIS — C9000 Multiple myeloma not having achieved remission: Secondary | ICD-10-CM

## 2014-03-13 MED ORDER — LENALIDOMIDE 10 MG PO CAPS: 10.0000 mg | ORAL_CAPSULE | Freq: Every day | ORAL | Status: DC

## 2014-03-13 NOTE — Telephone Encounter (Signed)
THIS REFILL REQUEST FOR REVLIMID WAS PLACED ON DR.GORSUCH'S DESK. 

## 2014-03-16 NOTE — Telephone Encounter (Signed)
RECEIVED A FAX FROM [facility] CONCERNING A CONFIRMATION OF [facility] RECEIPT FOR REFERRAL.

## 2014-03-17 ENCOUNTER — Other Ambulatory Visit: Payer: Self-pay | Admitting: Hematology and Oncology

## 2014-03-17 ENCOUNTER — Other Ambulatory Visit (HOSPITAL_BASED_OUTPATIENT_CLINIC_OR_DEPARTMENT_OTHER): Payer: Medicare HMO

## 2014-03-17 ENCOUNTER — Ambulatory Visit (HOSPITAL_BASED_OUTPATIENT_CLINIC_OR_DEPARTMENT_OTHER): Payer: Medicare HMO

## 2014-03-17 VITALS — BP 124/75 | HR 62 | Temp 98.3°F

## 2014-03-17 DIAGNOSIS — Z5112 Encounter for antineoplastic immunotherapy: Secondary | ICD-10-CM

## 2014-03-17 DIAGNOSIS — C9000 Multiple myeloma not having achieved remission: Secondary | ICD-10-CM

## 2014-03-17 DIAGNOSIS — T451X5A Adverse effect of antineoplastic and immunosuppressive drugs, initial encounter: Secondary | ICD-10-CM

## 2014-03-17 DIAGNOSIS — G620 Drug-induced polyneuropathy: Secondary | ICD-10-CM

## 2014-03-17 LAB — CBC WITH DIFFERENTIAL/PLATELET
BASO%: 0.4 % (ref 0.0–2.0)
Basophils Absolute: 0 10*3/uL (ref 0.0–0.1)
EOS%: 3.9 % (ref 0.0–7.0)
Eosinophils Absolute: 0.2 10*3/uL (ref 0.0–0.5)
HCT: 32.3 % — ABNORMAL LOW (ref 34.8–46.6)
HGB: 10.6 g/dL — ABNORMAL LOW (ref 11.6–15.9)
LYMPH%: 39.4 % (ref 14.0–49.7)
MCH: 32.5 pg (ref 25.1–34.0)
MCHC: 32.9 g/dL (ref 31.5–36.0)
MCV: 98.6 fL (ref 79.5–101.0)
MONO#: 0.8 10*3/uL (ref 0.1–0.9)
MONO%: 18.9 % — ABNORMAL HIGH (ref 0.0–14.0)
NEUT#: 1.6 10*3/uL (ref 1.5–6.5)
NEUT%: 37.4 % — ABNORMAL LOW (ref 38.4–76.8)
Platelets: 72 10*3/uL — ABNORMAL LOW (ref 145–400)
RBC: 3.28 10*6/uL — ABNORMAL LOW (ref 3.70–5.45)
RDW: 15.1 % — ABNORMAL HIGH (ref 11.2–14.5)
WBC: 4.2 10*3/uL (ref 3.9–10.3)
lymph#: 1.6 10*3/uL (ref 0.9–3.3)

## 2014-03-17 LAB — COMPREHENSIVE METABOLIC PANEL (CC13)
ALT: <6 U/L (ref 0–55)
AST: 11 U/L (ref 5–34)
Albumin: 3.3 g/dL — ABNORMAL LOW (ref 3.5–5.0)
Alkaline Phosphatase: 36 U/L — ABNORMAL LOW (ref 40–150)
Anion Gap: 7 mEq/L (ref 3–11)
BUN: 9.3 mg/dL (ref 7.0–26.0)
CO2: 28 mEq/L (ref 22–29)
Calcium: 8.4 mg/dL (ref 8.4–10.4)
Chloride: 108 mEq/L (ref 98–109)
Creatinine: 0.7 mg/dL (ref 0.6–1.1)
Glucose: 94 mg/dl (ref 70–140)
Potassium: 4 mEq/L (ref 3.5–5.1)
Sodium: 143 mEq/L (ref 136–145)
Total Bilirubin: 0.35 mg/dL (ref 0.20–1.20)
Total Protein: 7.6 g/dL (ref 6.4–8.3)

## 2014-03-17 MED ORDER — ONDANSETRON HCL 8 MG PO TABS
ORAL_TABLET | ORAL | Status: AC
  Filled 2014-03-17: qty 1

## 2014-03-17 MED ORDER — BORTEZOMIB CHEMO SQ INJECTION 3.5 MG (2.5MG/ML)
1.0000 mg/m2 | Freq: Once | INTRAMUSCULAR | Status: AC
  Administered 2014-03-17: 1.75 mg via SUBCUTANEOUS
  Filled 2014-03-17: qty 1.75

## 2014-03-17 MED ORDER — ONDANSETRON HCL 8 MG PO TABS
8.0000 mg | ORAL_TABLET | Freq: Once | ORAL | Status: AC
  Administered 2014-03-17: 8 mg via ORAL

## 2014-03-17 NOTE — Progress Notes (Signed)
Ok to treat today per Dr Alvy Bimler.

## 2014-03-17 NOTE — Patient Instructions (Signed)
[facility] Discharge Instructions for Patients Receiving Chemotherapy  Today you received the following chemotherapy agents velcade   To help prevent nausea and vomiting after your treatment, we encourage you to take your nausea medication as directed  If you develop nausea and vomiting that is not controlled by your nausea medication, call the clinic.   BELOW ARE SYMPTOMS THAT SHOULD BE REPORTED IMMEDIATELY:  *FEVER GREATER THAN 100.5 F  *CHILLS WITH OR WITHOUT FEVER  NAUSEA AND VOMITING THAT IS NOT CONTROLLED WITH YOUR NAUSEA MEDICATION  *UNUSUAL SHORTNESS OF BREATH  *UNUSUAL BRUISING OR BLEEDING  TENDERNESS IN MOUTH AND THROAT WITH OR WITHOUT PRESENCE OF ULCERS  *URINARY PROBLEMS  *BOWEL PROBLEMS  UNUSUAL RASH Items with * indicate a potential emergency and should be followed up as soon as possible.  Feel free to call the clinic you have any questions or concerns. The clinic phone number is [phone].  

## 2014-03-18 ENCOUNTER — Telehealth: Payer: Self-pay

## 2014-03-18 NOTE — Telephone Encounter (Signed)
Received confirmation of shipment of revlimid. Ship date 03/17/14

## 2014-03-24 ENCOUNTER — Encounter: Payer: Self-pay | Admitting: Hematology and Oncology

## 2014-03-24 ENCOUNTER — Other Ambulatory Visit: Payer: Self-pay | Admitting: Hematology and Oncology

## 2014-03-24 ENCOUNTER — Ambulatory Visit (HOSPITAL_BASED_OUTPATIENT_CLINIC_OR_DEPARTMENT_OTHER): Payer: Medicare HMO

## 2014-03-24 ENCOUNTER — Telehealth: Payer: Self-pay | Admitting: Hematology and Oncology

## 2014-03-24 ENCOUNTER — Other Ambulatory Visit (HOSPITAL_BASED_OUTPATIENT_CLINIC_OR_DEPARTMENT_OTHER): Payer: Medicare HMO

## 2014-03-24 ENCOUNTER — Ambulatory Visit (HOSPITAL_BASED_OUTPATIENT_CLINIC_OR_DEPARTMENT_OTHER): Payer: Medicare HMO | Admitting: Hematology and Oncology

## 2014-03-24 ENCOUNTER — Telehealth: Payer: Self-pay | Admitting: *Deleted

## 2014-03-24 VITALS — BP 138/62 | HR 66 | Temp 98.6°F | Resp 18 | Ht 63.0 in | Wt 147.1 lb

## 2014-03-24 DIAGNOSIS — T451X5A Adverse effect of antineoplastic and immunosuppressive drugs, initial encounter: Secondary | ICD-10-CM

## 2014-03-24 DIAGNOSIS — C9000 Multiple myeloma not having achieved remission: Secondary | ICD-10-CM

## 2014-03-24 DIAGNOSIS — D6959 Other secondary thrombocytopenia: Secondary | ICD-10-CM

## 2014-03-24 DIAGNOSIS — D630 Anemia in neoplastic disease: Secondary | ICD-10-CM

## 2014-03-24 DIAGNOSIS — G620 Drug-induced polyneuropathy: Secondary | ICD-10-CM

## 2014-03-24 DIAGNOSIS — Z5112 Encounter for antineoplastic immunotherapy: Secondary | ICD-10-CM

## 2014-03-24 DIAGNOSIS — T50905A Adverse effect of unspecified drugs, medicaments and biological substances, initial encounter: Secondary | ICD-10-CM

## 2014-03-24 LAB — CBC WITH DIFFERENTIAL/PLATELET
BASO%: 0.2 % (ref 0.0–2.0)
Basophils Absolute: 0 10*3/uL (ref 0.0–0.1)
EOS%: 2.2 % (ref 0.0–7.0)
Eosinophils Absolute: 0.1 10*3/uL (ref 0.0–0.5)
HCT: 34 % — ABNORMAL LOW (ref 34.8–46.6)
HGB: 11 g/dL — ABNORMAL LOW (ref 11.6–15.9)
LYMPH%: 34.8 % (ref 14.0–49.7)
MCH: 32.4 pg (ref 25.1–34.0)
MCHC: 32.4 g/dL (ref 31.5–36.0)
MCV: 100.3 fL (ref 79.5–101.0)
MONO#: 1.5 10*3/uL — ABNORMAL HIGH (ref 0.1–0.9)
MONO%: 27.1 % — ABNORMAL HIGH (ref 0.0–14.0)
NEUT#: 2 10*3/uL (ref 1.5–6.5)
NEUT%: 35.7 % — ABNORMAL LOW (ref 38.4–76.8)
Platelets: 99 10*3/uL — ABNORMAL LOW (ref 145–400)
RBC: 3.39 10*6/uL — ABNORMAL LOW (ref 3.70–5.45)
RDW: 15.1 % — ABNORMAL HIGH (ref 11.2–14.5)
WBC: 5.6 10*3/uL (ref 3.9–10.3)
lymph#: 1.9 10*3/uL (ref 0.9–3.3)

## 2014-03-24 LAB — COMPREHENSIVE METABOLIC PANEL (CC13)
ALT: <6 U/L (ref 0–55)
AST: 12 U/L (ref 5–34)
Albumin: 3.5 g/dL (ref 3.5–5.0)
Alkaline Phosphatase: 44 U/L (ref 40–150)
Anion Gap: 8 mEq/L (ref 3–11)
BUN: 11.7 mg/dL (ref 7.0–26.0)
CO2: 28 mEq/L (ref 22–29)
Calcium: 9.1 mg/dL (ref 8.4–10.4)
Chloride: 106 mEq/L (ref 98–109)
Creatinine: 0.8 mg/dL (ref 0.6–1.1)
Glucose: 97 mg/dl (ref 70–140)
Potassium: 4.2 mEq/L (ref 3.5–5.1)
Sodium: 142 mEq/L (ref 136–145)
Total Bilirubin: 0.35 mg/dL (ref 0.20–1.20)
Total Protein: 8.1 g/dL (ref 6.4–8.3)

## 2014-03-24 MED ORDER — ZOLEDRONIC ACID 4 MG/100ML IV SOLN
4.0000 mg | Freq: Once | INTRAVENOUS | Status: AC
  Administered 2014-03-24: 4 mg via INTRAVENOUS
  Filled 2014-03-24: qty 100

## 2014-03-24 MED ORDER — SODIUM CHLORIDE 0.9 % IV SOLN
Freq: Once | INTRAVENOUS | Status: AC
  Administered 2014-03-24: 10:00:00 via INTRAVENOUS

## 2014-03-24 MED ORDER — BORTEZOMIB CHEMO SQ INJECTION 3.5 MG (2.5MG/ML)
1.0000 mg/m2 | Freq: Once | INTRAMUSCULAR | Status: AC
  Administered 2014-03-24: 1.75 mg via SUBCUTANEOUS
  Filled 2014-03-24: qty 1.75

## 2014-03-24 MED ORDER — ONDANSETRON HCL 8 MG PO TABS
ORAL_TABLET | ORAL | Status: AC
  Filled 2014-03-24: qty 1

## 2014-03-24 MED ORDER — ONDANSETRON HCL 8 MG PO TABS
8.0000 mg | ORAL_TABLET | Freq: Once | ORAL | Status: AC
  Administered 2014-03-24: 8 mg via ORAL

## 2014-03-24 NOTE — Assessment & Plan Note (Signed)
She had partial response to treatment. However,  overall, with the additional Revlimid treatment, she is getting better. She has recent visit at [facility] for future bone marrow transplant. In the meantime, I recommend continue same dose treatment without dosage adjustment.

## 2014-03-24 NOTE — Assessment & Plan Note (Signed)
This is likely due to recent treatment. The patient denies recent history of bleeding such as epistaxis, hematuria or hematochezia. She is asymptomatic from the low platelet count. I will observe for now.  she does not require transfusion now.  

## 2014-03-24 NOTE — Assessment & Plan Note (Signed)
Her treatment Velcade was switched to weekly injection. So far this is stable.

## 2014-03-24 NOTE — Assessment & Plan Note (Signed)
The patient only had minor partial response with chemotherapy recently and decision was made to add Revlimid.Recheck of serum protein electrophoresis in light chains studies showed response to treatment. So far she is doing very well. She will continue dexamethasone weekly and monthly Zometa. So far she had no side effects from treatment. She had her dental visit recently with no complications. She is getting WORSENING peripheral neuropathy.

## 2014-03-24 NOTE — Progress Notes (Signed)
Ok to treat per Dr. Gorsuch 

## 2014-03-24 NOTE — Telephone Encounter (Signed)
Per staff message and POF I have scheduled appts. Advised scheduler of appts. JMW  

## 2014-03-24 NOTE — Assessment & Plan Note (Signed)
This is likely due to recent treatment. The patient denies recent history of bleeding such as epistaxis, hematuria or hematochezia. She is asymptomatic from the anemia. I will observe for now.  She does not require transfusion now. I will continue the chemotherapy at current dose without dosage adjustment.  If the anemia gets progressive worse in the future, I might have to delay her treatment or adjust the chemotherapy dose.  

## 2014-03-24 NOTE — Patient Instructions (Signed)
[facility] Discharge Instructions for Patients Receiving Chemotherapy  Today you received the following chemotherapy agents velcade.    To help prevent nausea and vomiting after your treatment, we encourage you to take your nausea medication as directed.    If you develop nausea and vomiting that is not controlled by your nausea medication, call the clinic.   BELOW ARE SYMPTOMS THAT SHOULD BE REPORTED IMMEDIATELY:  *FEVER GREATER THAN 100.5 F  *CHILLS WITH OR WITHOUT FEVER  NAUSEA AND VOMITING THAT IS NOT CONTROLLED WITH YOUR NAUSEA MEDICATION  *UNUSUAL SHORTNESS OF BREATH  *UNUSUAL BRUISING OR BLEEDING  TENDERNESS IN MOUTH AND THROAT WITH OR WITHOUT PRESENCE OF ULCERS  *URINARY PROBLEMS  *BOWEL PROBLEMS  UNUSUAL RASH Items with * indicate a potential emergency and should be followed up as soon as possible.  Feel free to call the clinic you have any questions or concerns. The clinic phone number is [phone].   Zoledronic Acid injection (Hypercalcemia, Oncology) What is this medicine? ZOLEDRONIC ACID (ZOE le dron ik AS id) lowers the amount of calcium loss from bone. It is used to treat too much calcium in your blood from cancer. It is also used to prevent complications of cancer that has spread to the bone. This medicine may be used for other purposes; ask your health care provider or pharmacist if you have questions. COMMON BRAND NAME(S): Zometa What should I tell my health care provider before I take this medicine? They need to know if you have any of these conditions: -aspirin-sensitive asthma -cancer, especially if you are receiving medicines used to treat cancer -dental disease or wear dentures -infection -kidney disease -receiving corticosteroids like dexamethasone or prednisone -an unusual or allergic reaction to zoledronic acid, other medicines, foods, dyes, or preservatives -pregnant or trying to get pregnant -breast-feeding How should I  use this medicine? This medicine is for infusion into a vein. It is given by a health care professional in a hospital or clinic setting. Talk to your pediatrician regarding the use of this medicine in children. Special care may be needed. Overdosage: If you think you have taken too much of this medicine contact a poison control center or emergency room at once. NOTE: This medicine is only for you. Do not share this medicine with others. What if I miss a dose? It is important not to miss your dose. Call your doctor or health care professional if you are unable to keep an appointment. What may interact with this medicine? -certain antibiotics given by injection -NSAIDs, medicines for pain and inflammation, like ibuprofen or naproxen -some diuretics like bumetanide, furosemide -teriparatide -thalidomide This list may not describe all possible interactions. Give your health care provider a list of all the medicines, herbs, non-prescription drugs, or dietary supplements you use. Also tell them if you smoke, drink alcohol, or use illegal drugs. Some items may interact with your medicine. What should I watch for while using this medicine? Visit your doctor or health care professional for regular checkups. It may be some time before you see the benefit from this medicine. Do not stop taking your medicine unless your doctor tells you to. Your doctor may order blood tests or other tests to see how you are doing. Women should inform their doctor if they wish to become pregnant or think they might be pregnant. There is a potential for serious side effects to an unborn child. Talk to your health care professional or pharmacist for more information. You should make sure  that you get enough calcium and vitamin D while you are taking this medicine. Discuss the foods you eat and the vitamins you take with your health care professional. Some people who take this medicine have severe bone, joint, and/or muscle pain.  This medicine may also increase your risk for jaw problems or a broken thigh bone. Tell your doctor right away if you have severe pain in your jaw, bones, joints, or muscles. Tell your doctor if you have any pain that does not go away or that gets worse. Tell your dentist and dental surgeon that you are taking this medicine. You should not have major dental surgery while on this medicine. See your dentist to have a dental exam and fix any dental problems before starting this medicine. Take good care of your teeth while on this medicine. Make sure you see your dentist for regular follow-up appointments. What side effects may I notice from receiving this medicine? Side effects that you should report to your doctor or health care professional as soon as possible: -allergic reactions like skin rash, itching or hives, swelling of the face, lips, or tongue -anxiety, confusion, or depression -breathing problems -changes in vision -eye pain -feeling faint or lightheaded, falls -jaw pain, especially after dental work -mouth sores -muscle cramps, stiffness, or weakness -trouble passing urine or change in the amount of urine Side effects that usually do not require medical attention (report to your doctor or health care professional if they continue or are bothersome): -bone, joint, or muscle pain -constipation -diarrhea -fever -hair loss -irritation at site where injected -loss of appetite -nausea, vomiting -stomach upset -trouble sleeping -trouble swallowing -weak or tired This list may not describe all possible side effects. Call your doctor for medical advice about side effects. You may report side effects to FDA at 1-800-FDA-1088. Where should I keep my medicine? This drug is given in a hospital or clinic and will not be stored at home. NOTE: This sheet is a summary. It may not cover all possible information. If you have questions about this medicine, talk to your doctor, pharmacist, or  health care provider.  2015, [employer]vier/Gold Standard. (2012-11-21 13:03:13)

## 2014-03-24 NOTE — Assessment & Plan Note (Signed)
This is likely due to recent treatment. The patient denies recent history of bleeding such as epistaxis, hematuria or hematochezia. She is asymptomatic from the low platelet count. I will observe for now.  she does not require transfusion now. I will continue the chemotherapy at current dose without dosage adjustment.  If the thrombocytopenia gets progressive worse in the future, I might have to delay her treatment or adjust the chemotherapy dose.   

## 2014-03-24 NOTE — Telephone Encounter (Signed)
Pt confirmed labs/ov per 09/29 POF, sent msg to add chemo, gave pt AVS...Marland KitchenMarland KitchenMarland Kitchen kJ

## 2014-03-24 NOTE — Progress Notes (Signed)
[facility] OFFICE PROGRESS NOTE  Patient Care Team: Jinny Sanders, MD as PCP - General (Family Medicine) [name], MD as Referring Physician (Hematology and Oncology)  SUMMARY OF ONCOLOGIC HISTORY: Oncology History   Multiple myeloma, Ig A Lambda, M spike 3.54 grams, Calcium 9.2, Creatinine 0.8, Beta 2 microglobulin 4.52, IgA 4840 mg/dL, lambda light chain 75.4, albumin 3.6, hemoglobin 9.7, platelet 115    Primary site: Multiple Myeloma   Staging method: AJCC 6th Edition   Clinical: Stage IIA signed by [name], MD on 11/07/2013  2:46 PM   Summary: Stage IIA        Multiple myeloma, without mention of having achieved remission   10/31/2013 Bone Marrow Biopsy Bone marrow biopsy confirmed multiple myeloma with 40% bone marrow involvement. Skeletal survey showed minimal lesions in her score with generalized demineralization   11/10/2013 - 02/13/2014 Chemotherapy The patient is started on induction chemotherapy with weekly dexamethasone 40 mg by mouth as well as Velcade subcutaneous injection on days 1, 4, 8 and 11. On 11/21/2013, she was started on monthly Zometa.   12/23/2013 Adverse Reaction The dose of Velcade was reduced due to thrombocytopenia.   01/28/2014 -  Chemotherapy Revlimid is added   02/24/2014 -  Chemotherapy Due to worsening peripheral neuropathy, Velcade injection is chain to once a week. She will continue on monthly Zometa and Revlimid 21 days on, 7 days off.    INTERVAL HISTORY: Please see below for problem oriented charting. She is seen today prior to her weekly treatment. Overall, she feels well. The patient denies any recent signs or symptoms of bleeding such as spontaneous epistaxis, hematuria or hematochezia.  REVIEW OF SYSTEMS:   Constitutional: Denies fevers, chills or abnormal weight loss Eyes: Denies blurriness of vision Ears, nose, mouth, throat, and face: Denies mucositis or sore throat Respiratory: Denies cough, dyspnea or  wheezes Cardiovascular: Denies palpitation, chest discomfort or lower extremity swelling Gastrointestinal:  Denies nausea, heartburn or change in bowel habits Skin: Denies abnormal skin rashes Lymphatics: Denies new lymphadenopathy or easy bruising Neurological:Denies numbness, tingling or new weaknesses Behavioral/Psych: Mood is stable, no new changes  All other systems were reviewed with the patient and are negative.  I have reviewed the past medical history, past surgical history, social history and family history with the patient and they are unchanged from previous note.  ALLERGIES:  is allergic to penicillins and sulfa antibiotics.  MEDICATIONS:  Current Outpatient Prescriptions  Medication Sig Dispense Refill  . acyclovir (ZOVIRAX) 400 MG tablet TAKE 1 TABLET (400 MG TOTAL) BY MOUTH 2 (TWO) TIMES DAILY.  60 tablet  3  . ALPRAZolam (XANAX) 0.5 MG tablet Take 1 tablet (0.5 mg total) by mouth 2 (two) times daily as needed for anxiety.  30 tablet  0  . carvedilol (COREG) 6.25 MG tablet Take 0.5 tablets (3.125 mg total) by mouth 2 (two) times daily with a meal.  90 tablet  3  . cholecalciferol (VITAMIN D) 1000 UNITS tablet Take 1,000 Units by mouth daily.      Marland Kitchen dexamethasone (DECADRON) 4 MG tablet Take 10 tablets (40 mg total) by mouth once a week.  100 tablet  1  . docusate sodium (COLACE) 100 MG capsule Take 100 mg by mouth 2 (two) times daily.      Marland Kitchen lenalidomide (REVLIMID) 10 MG capsule Take 1 capsule (10 mg total) by mouth daily.  21 capsule  0  . loperamide (IMODIUM A-D) 2 MG tablet Take 2 mg by  mouth 4 (four) times daily as needed for diarrhea or loose stools.      . mirtazapine (REMERON) 15 MG tablet TAKE 1 TABLET BY MOUTH AT BEDTIME  30 tablet  0  . mirtazapine (REMERON) 30 MG tablet Take 1 tablet (30 mg total) by mouth at bedtime.  60 tablet  1  . Multiple Vitamins-Minerals (CENTRUM SILVER ULTRA WOMENS PO) Take by mouth daily.       No current facility-administered  medications for this visit.    PHYSICAL EXAMINATION: ECOG PERFORMANCE STATUS: 0 - Asymptomatic  Filed Vitals:   03/24/14 0845  BP: 138/62  Pulse: 66  Temp: 98.6 F (37 C)  Resp: 18   Filed Weights   03/24/14 0845  Weight: 147 lb 1.6 oz (66.724 kg)    GENERAL:alert, no distress and comfortable SKIN: skin color, texture, turgor are normal, no rashes or significant lesions EYES: normal, Conjunctiva are pink and non-injected, sclera clear OROPHARYNX:no exudate, no erythema and lips, buccal mucosa, and tongue normal  NECK: supple, thyroid normal size, non-tender, without nodularity LYMPH:  no palpable lymphadenopathy in the cervical, axillary or inguinal LUNGS: clear to auscultation and percussion with normal breathing effort HEART: regular rate & rhythm and no murmurs and no lower extremity edema ABDOMEN:abdomen soft, non-tender and normal bowel sounds Musculoskeletal:no cyanosis of digits and no clubbing  NEURO: alert & oriented x 3 with fluent speech, no focal motor/sensory deficits  LABORATORY DATA:  I have reviewed the data as listed    Component Value Date/Time   NA 143 03/17/2014 0831   NA 141 02/03/2014 0909   K 4.0 03/17/2014 0831   K 3.8 02/03/2014 0909   CL 101 02/03/2014 0909   CO2 28 03/17/2014 0831   CO2 25 02/03/2014 0909   GLUCOSE 94 03/17/2014 0831   GLUCOSE 108* 02/03/2014 0909   BUN 9.3 03/17/2014 0831   BUN 18 02/03/2014 0909   CREATININE 0.7 03/17/2014 0831   CREATININE 0.68 02/03/2014 0909   CALCIUM 8.4 03/17/2014 0831   CALCIUM 9.2 02/03/2014 0909   PROT 7.6 03/17/2014 0831   PROT 8.5* 02/03/2014 0909   ALBUMIN 3.3* 03/17/2014 0831   ALBUMIN 3.5 02/03/2014 0909   AST 11 03/17/2014 0831   AST 14 02/03/2014 0909   ALT <6 03/17/2014 0831   ALT 12 02/03/2014 0909   ALKPHOS 36* 03/17/2014 0831   ALKPHOS 53 02/03/2014 0909   BILITOT 0.35 03/17/2014 0831   BILITOT <0.2* 02/03/2014 0909    No results found for this basename: SPEP, UPEP,  kappa and lambda light chains     Lab Results  Component Value Date   WBC 5.6 03/24/2014   NEUTROABS 2.0 03/24/2014   HGB 11.0* 03/24/2014   HCT 34.0* 03/24/2014   MCV 100.3 03/24/2014   PLT 99* 03/24/2014      Chemistry      Component Value Date/Time   NA 143 03/17/2014 0831   NA 141 02/03/2014 0909   K 4.0 03/17/2014 0831   K 3.8 02/03/2014 0909   CL 101 02/03/2014 0909   CO2 28 03/17/2014 0831   CO2 25 02/03/2014 0909   BUN 9.3 03/17/2014 0831   BUN 18 02/03/2014 0909   CREATININE 0.7 03/17/2014 0831   CREATININE 0.68 02/03/2014 0909      Component Value Date/Time   CALCIUM 8.4 03/17/2014 0831   CALCIUM 9.2 02/03/2014 0909   ALKPHOS 36* 03/17/2014 0831   ALKPHOS 53 02/03/2014 0909   AST 11 03/17/2014 0831  AST 14 02/03/2014 0909   ALT <6 03/17/2014 0831   ALT 12 02/03/2014 0909   BILITOT 0.35 03/17/2014 0831   BILITOT <0.2* 02/03/2014 0909      ASSESSMENT & PLAN:  Neuropathy due to chemotherapeutic drug Her treatment Velcade was switched to weekly injection. So far this is stable.  Anemia in neoplastic disease This is likely due to recent treatment. The patient denies recent history of bleeding such as epistaxis, hematuria or hematochezia. She is asymptomatic from the anemia. I will observe for now.  She does not require transfusion now. I will continue the chemotherapy at current dose without dosage adjustment.  If the anemia gets progressive worse in the future, I might have to delay her treatment or adjust the chemotherapy dose.     Multiple myeloma, without mention of having achieved remission The patient only had minor partial response with chemotherapy recently and decision was made to add Revlimid.Recheck of serum protein electrophoresis in light chains studies showed response to treatment. So far she is doing very well. She will continue dexamethasone weekly and monthly Zometa. So far she had no side effects from treatment. She had her dental visit recently with no complications. She is getting WORSENING  peripheral neuropathy.  Thrombocytopenia due to drugs This is likely due to recent treatment. The patient denies recent history of bleeding such as epistaxis, hematuria or hematochezia. She is asymptomatic from the low platelet count. I will observe for now.  she does not require transfusion now.         Orders Placed This Encounter  Procedures  . SPEP & IFE with QIG    Standing Status: Future     Number of Occurrences:      Standing Expiration Date: 04/28/2015  . Kappa/lambda light chains    Standing Status: Future     Number of Occurrences:      Standing Expiration Date: 04/28/2015   All questions were answered. The patient knows to call the clinic with any problems, questions or concerns. No barriers to learning was detected. I spent 30 minutes counseling the patient face to face. The total time spent in the appointment was 40 minutes and more than 50% was on counseling and review of test results     [facility], [place], MD 03/24/2014 9:24 AM

## 2014-03-25 ENCOUNTER — Telehealth: Payer: Self-pay | Admitting: *Deleted

## 2014-03-25 NOTE — Telephone Encounter (Signed)
Pt left VM asks for her chemo on 10/06 to be moved up from 3:15 pm to earlier in day.  Her lab is at 9:45 am and her chemo at 3:15 pm.  Message sent to scheduler to change appt time.

## 2014-03-26 ENCOUNTER — Telehealth: Payer: Self-pay | Admitting: *Deleted

## 2014-03-26 ENCOUNTER — Other Ambulatory Visit: Payer: Self-pay | Admitting: Hematology and Oncology

## 2014-03-26 NOTE — Telephone Encounter (Signed)
Per staff message from desk RN I adjusted 10/6 appt

## 2014-03-26 NOTE — Telephone Encounter (Signed)
Informed pt of appts on 10/06 changed to earlier in day per her request.  She verbalized understanding.

## 2014-03-31 ENCOUNTER — Ambulatory Visit (HOSPITAL_BASED_OUTPATIENT_CLINIC_OR_DEPARTMENT_OTHER): Payer: Medicare HMO

## 2014-03-31 ENCOUNTER — Other Ambulatory Visit (HOSPITAL_BASED_OUTPATIENT_CLINIC_OR_DEPARTMENT_OTHER): Payer: Medicare HMO

## 2014-03-31 VITALS — BP 134/56 | HR 59 | Temp 98.4°F

## 2014-03-31 DIAGNOSIS — T451X5A Adverse effect of antineoplastic and immunosuppressive drugs, initial encounter: Secondary | ICD-10-CM

## 2014-03-31 DIAGNOSIS — Z5112 Encounter for antineoplastic immunotherapy: Secondary | ICD-10-CM

## 2014-03-31 DIAGNOSIS — C9000 Multiple myeloma not having achieved remission: Secondary | ICD-10-CM

## 2014-03-31 DIAGNOSIS — G620 Drug-induced polyneuropathy: Secondary | ICD-10-CM

## 2014-03-31 LAB — CBC WITH DIFFERENTIAL/PLATELET
BASO%: 0.4 % (ref 0.0–2.0)
Basophils Absolute: 0 10*3/uL (ref 0.0–0.1)
EOS%: 3 % (ref 0.0–7.0)
Eosinophils Absolute: 0.2 10*3/uL (ref 0.0–0.5)
HCT: 32.2 % — ABNORMAL LOW (ref 34.8–46.6)
HGB: 10.6 g/dL — ABNORMAL LOW (ref 11.6–15.9)
LYMPH%: 27.4 % (ref 14.0–49.7)
MCH: 32.3 pg (ref 25.1–34.0)
MCHC: 32.8 g/dL (ref 31.5–36.0)
MCV: 98.4 fL (ref 79.5–101.0)
MONO#: 0.8 10*3/uL (ref 0.1–0.9)
MONO%: 14.8 % — ABNORMAL HIGH (ref 0.0–14.0)
NEUT#: 3 10*3/uL (ref 1.5–6.5)
NEUT%: 54.4 % (ref 38.4–76.8)
Platelets: 115 10*3/uL — ABNORMAL LOW (ref 145–400)
RBC: 3.27 10*6/uL — ABNORMAL LOW (ref 3.70–5.45)
RDW: 15.1 % — ABNORMAL HIGH (ref 11.2–14.5)
WBC: 5.6 10*3/uL (ref 3.9–10.3)
lymph#: 1.5 10*3/uL (ref 0.9–3.3)

## 2014-03-31 LAB — COMPREHENSIVE METABOLIC PANEL (CC13)
ALT: 7 U/L (ref 0–55)
AST: 10 U/L (ref 5–34)
Albumin: 3.3 g/dL — ABNORMAL LOW (ref 3.5–5.0)
Alkaline Phosphatase: 37 U/L — ABNORMAL LOW (ref 40–150)
Anion Gap: 5 mEq/L (ref 3–11)
BUN: 8.7 mg/dL (ref 7.0–26.0)
CO2: 28 mEq/L (ref 22–29)
Calcium: 8.9 mg/dL (ref 8.4–10.4)
Chloride: 107 mEq/L (ref 98–109)
Creatinine: 0.7 mg/dL (ref 0.6–1.1)
Glucose: 77 mg/dl (ref 70–140)
Potassium: 4 mEq/L (ref 3.5–5.1)
Sodium: 140 mEq/L (ref 136–145)
Total Bilirubin: 0.36 mg/dL (ref 0.20–1.20)
Total Protein: 7.6 g/dL (ref 6.4–8.3)

## 2014-03-31 MED ORDER — ONDANSETRON HCL 8 MG PO TABS
ORAL_TABLET | ORAL | Status: AC
  Filled 2014-03-31: qty 1

## 2014-03-31 MED ORDER — BORTEZOMIB CHEMO SQ INJECTION 3.5 MG (2.5MG/ML)
1.0000 mg/m2 | Freq: Once | INTRAMUSCULAR | Status: AC
  Administered 2014-03-31: 1.75 mg via SUBCUTANEOUS
  Filled 2014-03-31: qty 1.75

## 2014-03-31 MED ORDER — ONDANSETRON HCL 8 MG PO TABS
8.0000 mg | ORAL_TABLET | Freq: Once | ORAL | Status: AC
  Administered 2014-03-31: 8 mg via ORAL

## 2014-03-31 NOTE — Patient Instructions (Signed)
[facility] Discharge Instructions for Patients Receiving Chemotherapy  Today you received the following chemotherapy agents: Velcade.  To help prevent nausea and vomiting after your treatment, we encourage you to take your nausea medication: compazine 10 mg every 6 hours as needed.   If you develop nausea and vomiting that is not controlled by your nausea medication, call the clinic.   BELOW ARE SYMPTOMS THAT SHOULD BE REPORTED IMMEDIATELY:  *FEVER GREATER THAN 100.5 F  *CHILLS WITH OR WITHOUT FEVER  NAUSEA AND VOMITING THAT IS NOT CONTROLLED WITH YOUR NAUSEA MEDICATION  *UNUSUAL SHORTNESS OF BREATH  *UNUSUAL BRUISING OR BLEEDING  TENDERNESS IN MOUTH AND THROAT WITH OR WITHOUT PRESENCE OF ULCERS  *URINARY PROBLEMS  *BOWEL PROBLEMS  UNUSUAL RASH Items with * indicate a potential emergency and should be followed up as soon as possible.  Feel free to call the clinic you have any questions or concerns. The clinic phone number is [phone].    

## 2014-04-03 LAB — SPEP & IFE WITH QIG
Albumin ELP: 46.7 % — ABNORMAL LOW (ref 55.8–66.1)
Alpha-1-Globulin: 2.8 % — ABNORMAL LOW (ref 2.9–4.9)
Alpha-2-Globulin: 6.3 % — ABNORMAL LOW (ref 7.1–11.8)
Beta 2: 33.6 % — ABNORMAL HIGH (ref 3.2–6.5)
Beta Globulin: 6.6 % (ref 4.7–7.2)
Gamma Globulin: 4 % — ABNORMAL LOW (ref 11.1–18.8)
IgA: 2940 mg/dL — ABNORMAL HIGH (ref 69–380)
IgG (Immunoglobin G), Serum: 291 mg/dL — ABNORMAL LOW (ref 690–1700)
IgM, Serum: 9 mg/dL — ABNORMAL LOW (ref 52–322)
M-Spike, %: 2.29 g/dL
Total Protein, Serum Electrophoresis: 7.8 g/dL (ref 6.0–8.3)

## 2014-04-03 LAB — KAPPA/LAMBDA LIGHT CHAINS
Kappa free light chain: 0.27 mg/dL — ABNORMAL LOW (ref 0.33–1.94)
Kappa:Lambda Ratio: 0.01 — ABNORMAL LOW (ref 0.26–1.65)
Lambda Free Lght Chn: 52.9 mg/dL — ABNORMAL HIGH (ref 0.57–2.63)

## 2014-04-06 ENCOUNTER — Other Ambulatory Visit: Payer: Self-pay | Admitting: *Deleted

## 2014-04-06 ENCOUNTER — Telehealth: Payer: Self-pay | Admitting: Hematology and Oncology

## 2014-04-06 NOTE — Telephone Encounter (Signed)
added f/u and asjusted lab appt time for 10/13. lmonvm for pt w/new time for 10/13 @ 8:15am

## 2014-04-07 ENCOUNTER — Encounter: Payer: Self-pay | Admitting: Hematology and Oncology

## 2014-04-07 ENCOUNTER — Telehealth: Payer: Self-pay | Admitting: Hematology and Oncology

## 2014-04-07 ENCOUNTER — Ambulatory Visit (HOSPITAL_BASED_OUTPATIENT_CLINIC_OR_DEPARTMENT_OTHER): Payer: Medicare HMO | Admitting: Hematology and Oncology

## 2014-04-07 ENCOUNTER — Telehealth: Payer: Self-pay | Admitting: *Deleted

## 2014-04-07 ENCOUNTER — Ambulatory Visit (HOSPITAL_BASED_OUTPATIENT_CLINIC_OR_DEPARTMENT_OTHER): Payer: Medicare HMO

## 2014-04-07 ENCOUNTER — Other Ambulatory Visit (HOSPITAL_BASED_OUTPATIENT_CLINIC_OR_DEPARTMENT_OTHER): Payer: Medicare HMO

## 2014-04-07 VITALS — BP 133/58 | HR 72 | Temp 98.7°F | Resp 18 | Ht 63.0 in | Wt 147.0 lb

## 2014-04-07 DIAGNOSIS — R197 Diarrhea, unspecified: Secondary | ICD-10-CM

## 2014-04-07 DIAGNOSIS — T451X5A Adverse effect of antineoplastic and immunosuppressive drugs, initial encounter: Secondary | ICD-10-CM

## 2014-04-07 DIAGNOSIS — C9000 Multiple myeloma not having achieved remission: Secondary | ICD-10-CM

## 2014-04-07 DIAGNOSIS — T50905A Adverse effect of unspecified drugs, medicaments and biological substances, initial encounter: Secondary | ICD-10-CM

## 2014-04-07 DIAGNOSIS — G620 Drug-induced polyneuropathy: Secondary | ICD-10-CM

## 2014-04-07 DIAGNOSIS — D6959 Other secondary thrombocytopenia: Secondary | ICD-10-CM

## 2014-04-07 DIAGNOSIS — D630 Anemia in neoplastic disease: Secondary | ICD-10-CM

## 2014-04-07 DIAGNOSIS — Z5112 Encounter for antineoplastic immunotherapy: Secondary | ICD-10-CM

## 2014-04-07 LAB — COMPREHENSIVE METABOLIC PANEL (CC13)
ALT: 9 U/L (ref 0–55)
AST: 10 U/L (ref 5–34)
Albumin: 3.4 g/dL — ABNORMAL LOW (ref 3.5–5.0)
Alkaline Phosphatase: 41 U/L (ref 40–150)
Anion Gap: 9 mEq/L (ref 3–11)
BUN: 7.4 mg/dL (ref 7.0–26.0)
CO2: 26 mEq/L (ref 22–29)
Calcium: 8.7 mg/dL (ref 8.4–10.4)
Chloride: 107 mEq/L (ref 98–109)
Creatinine: 0.7 mg/dL (ref 0.6–1.1)
Glucose: 76 mg/dl (ref 70–140)
Potassium: 3.8 mEq/L (ref 3.5–5.1)
Sodium: 143 mEq/L (ref 136–145)
Total Bilirubin: 0.36 mg/dL (ref 0.20–1.20)
Total Protein: 7.8 g/dL (ref 6.4–8.3)

## 2014-04-07 LAB — CBC WITH DIFFERENTIAL/PLATELET
BASO%: 0.2 % (ref 0.0–2.0)
Basophils Absolute: 0 10*3/uL (ref 0.0–0.1)
EOS%: 2.3 % (ref 0.0–7.0)
Eosinophils Absolute: 0.1 10*3/uL (ref 0.0–0.5)
HCT: 34.4 % — ABNORMAL LOW (ref 34.8–46.6)
HGB: 11.2 g/dL — ABNORMAL LOW (ref 11.6–15.9)
LYMPH%: 30.6 % (ref 14.0–49.7)
MCH: 32.2 pg (ref 25.1–34.0)
MCHC: 32.6 g/dL (ref 31.5–36.0)
MCV: 98.9 fL (ref 79.5–101.0)
MONO#: 1 10*3/uL — ABNORMAL HIGH (ref 0.1–0.9)
MONO%: 16.7 % — ABNORMAL HIGH (ref 0.0–14.0)
NEUT#: 3 10*3/uL (ref 1.5–6.5)
NEUT%: 50.2 % (ref 38.4–76.8)
Platelets: 65 10*3/uL — ABNORMAL LOW (ref 145–400)
RBC: 3.48 10*6/uL — ABNORMAL LOW (ref 3.70–5.45)
RDW: 14.8 % — ABNORMAL HIGH (ref 11.2–14.5)
WBC: 6 10*3/uL (ref 3.9–10.3)
lymph#: 1.9 10*3/uL (ref 0.9–3.3)

## 2014-04-07 MED ORDER — ONDANSETRON HCL 8 MG PO TABS
ORAL_TABLET | ORAL | Status: AC
  Filled 2014-04-07: qty 1

## 2014-04-07 MED ORDER — ONDANSETRON HCL 8 MG PO TABS
8.0000 mg | ORAL_TABLET | Freq: Once | ORAL | Status: AC
  Administered 2014-04-07: 8 mg via ORAL

## 2014-04-07 MED ORDER — BORTEZOMIB CHEMO SQ INJECTION 3.5 MG (2.5MG/ML)
1.3000 mg/m2 | Freq: Once | INTRAMUSCULAR | Status: AC
  Administered 2014-04-07: 2.25 mg via SUBCUTANEOUS
  Filled 2014-04-07: qty 2.25

## 2014-04-07 NOTE — Progress Notes (Signed)
[facility] OFFICE PROGRESS NOTE  Patient Care Team: Jinny Sanders, MD as PCP - General (Family Medicine) [name], MD as Referring Physician (Hematology and Oncology) [name], MD as Consulting Physician (Internal Medicine)  SUMMARY OF ONCOLOGIC HISTORY: Oncology History   Multiple myeloma, Ig A Lambda, M spike 3.54 grams, Calcium 9.2, Creatinine 0.8, Beta 2 microglobulin 4.52, IgA 4840 mg/dL, lambda light chain 75.4, albumin 3.6, hemoglobin 9.7, platelet 115    Primary site: Multiple Myeloma   Staging method: AJCC 6th Edition   Clinical: Stage IIA signed by [name], MD on 11/07/2013  2:46 PM   Summary: Stage IIA        Multiple myeloma   10/31/2013 Bone Marrow Biopsy Bone marrow biopsy confirmed multiple myeloma with 40% bone marrow involvement. Skeletal survey showed minimal lesions in her score with generalized demineralization   11/10/2013 - 02/13/2014 Chemotherapy The patient is started on induction chemotherapy with weekly dexamethasone 40 mg by mouth as well as Velcade subcutaneous injection on days 1, 4, 8 and 11. On 11/21/2013, she was started on monthly Zometa.   12/23/2013 Adverse Reaction The dose of Velcade was reduced due to thrombocytopenia.   01/28/2014 - 04/07/2014 Chemotherapy Revlimid is added. Treatment was discontinued due to lack of response.   02/24/2014 - 04/07/2014 Chemotherapy Due to worsening peripheral neuropathy, Velcade injection is changed to once a week. Revlimid was given 21 days on, 7 days off.   04/07/2014 -  Chemotherapy Revlimid was discontinued due to lack of response. Chemotherapy was changed back to Velcade injection twice a week, 2 weeks on 1 week off.    INTERVAL HISTORY: Please see below for problem oriented charting. She complained of some diarrhea. She is anxious to review test results. Denies new bone pain. Denies worsening peripheral neuropathy. She has some mild altered sensation in her fingers.  REVIEW  OF SYSTEMS:   Constitutional: Denies fevers, chills or abnormal weight loss Eyes: Denies blurriness of vision Ears, nose, mouth, throat, and face: Denies mucositis or sore throat Respiratory: Denies cough, dyspnea or wheezes Cardiovascular: Denies palpitation, chest discomfort or lower extremity swelling Gastrointestinal:  Denies nausea, heartburn or change in bowel habits Skin: Denies abnormal skin rashes Lymphatics: Denies new lymphadenopathy or easy bruising Neurological:Denies numbness, tingling or new weaknesses Behavioral/Psych: Mood is stable, no new changes  All other systems were reviewed with the patient and are negative.  I have reviewed the past medical history, past surgical history, social history and family history with the patient and they are unchanged from previous note.  ALLERGIES:  is allergic to penicillins and sulfa antibiotics.  MEDICATIONS:  Current Outpatient Prescriptions  Medication Sig Dispense Refill  . acyclovir (ZOVIRAX) 400 MG tablet TAKE 1 TABLET (400 MG TOTAL) BY MOUTH 2 (TWO) TIMES DAILY.  60 tablet  3  . ALPRAZolam (XANAX) 0.5 MG tablet Take 1 tablet (0.5 mg total) by mouth 2 (two) times daily as needed for anxiety.  30 tablet  0  . carvedilol (COREG) 6.25 MG tablet Take 0.5 tablets (3.125 mg total) by mouth 2 (two) times daily with a meal.  90 tablet  3  . cholecalciferol (VITAMIN D) 1000 UNITS tablet Take 1,000 Units by mouth daily.      Marland Kitchen dexamethasone (DECADRON) 4 MG tablet TAKE 10 TABLETS BY MOUTH ONCE A WEEK ON MONDAY MORNINGS WITH FOOD  100 tablet  1  . docusate sodium (COLACE) 100 MG capsule Take 100 mg by mouth 2 (two) times daily.      Marland Kitchen  loperamide (IMODIUM A-D) 2 MG tablet Take 2 mg by mouth 4 (four) times daily as needed for diarrhea or loose stools.      . mirtazapine (REMERON) 15 MG tablet TAKE 1 TABLET BY MOUTH AT BEDTIME  30 tablet  0  . mirtazapine (REMERON) 30 MG tablet Take 1 tablet (30 mg total) by mouth at bedtime.  60 tablet  1  .  Multiple Vitamins-Minerals (CENTRUM SILVER ULTRA WOMENS PO) Take by mouth daily.       No current facility-administered medications for this visit.    PHYSICAL EXAMINATION: ECOG PERFORMANCE STATUS: 1 - Symptomatic but completely ambulatory  Filed Vitals:   04/07/14 0842  BP: 133/58  Pulse: 72  Temp: 98.7 F (37.1 C)  Resp: 18   Filed Weights   04/07/14 0842  Weight: 147 lb (66.679 kg)    GENERAL:alert, no distress and comfortable SKIN: skin color, texture, turgor are normal, no rashes or significant lesions EYES: normal, Conjunctiva are pink and non-injected, sclera clear OROPHARYNX:no exudate, no erythema and lips, buccal mucosa, and tongue normal  Musculoskeletal:no cyanosis of digits and no clubbing  NEURO: alert & oriented x 3 with fluent speech, no focal motor/sensory deficits  LABORATORY DATA:  I have reviewed the data as listed    Component Value Date/Time   NA 143 04/07/2014 0806   NA 141 02/03/2014 0909   K 3.8 04/07/2014 0806   K 3.8 02/03/2014 0909   CL 101 02/03/2014 0909   CO2 26 04/07/2014 0806   CO2 25 02/03/2014 0909   GLUCOSE 76 04/07/2014 0806   GLUCOSE 108* 02/03/2014 0909   BUN 7.4 04/07/2014 0806   BUN 18 02/03/2014 0909   CREATININE 0.7 04/07/2014 0806   CREATININE 0.68 02/03/2014 0909   CALCIUM 8.7 04/07/2014 0806   CALCIUM 9.2 02/03/2014 0909   PROT 7.8 04/07/2014 0806   PROT 8.5* 02/03/2014 0909   ALBUMIN 3.4* 04/07/2014 0806   ALBUMIN 3.5 02/03/2014 0909   AST 10 04/07/2014 0806   AST 14 02/03/2014 0909   ALT 9 04/07/2014 0806   ALT 12 02/03/2014 0909   ALKPHOS 41 04/07/2014 0806   ALKPHOS 53 02/03/2014 0909   BILITOT 0.36 04/07/2014 0806   BILITOT <0.2* 02/03/2014 0909    No results found for this basename: SPEP, UPEP,  kappa and lambda light chains    Lab Results  Component Value Date   WBC 6.0 04/07/2014   NEUTROABS 3.0 04/07/2014   HGB 11.2* 04/07/2014   HCT 34.4* 04/07/2014   MCV 98.9 04/07/2014   PLT 65* 04/07/2014       Chemistry      Component Value Date/Time   NA 143 04/07/2014 0806   NA 141 02/03/2014 0909   K 3.8 04/07/2014 0806   K 3.8 02/03/2014 0909   CL 101 02/03/2014 0909   CO2 26 04/07/2014 0806   CO2 25 02/03/2014 0909   BUN 7.4 04/07/2014 0806   BUN 18 02/03/2014 0909   CREATININE 0.7 04/07/2014 0806   CREATININE 0.68 02/03/2014 0909      Component Value Date/Time   CALCIUM 8.7 04/07/2014 0806   CALCIUM 9.2 02/03/2014 0909   ALKPHOS 41 04/07/2014 0806   ALKPHOS 53 02/03/2014 0909   AST 10 04/07/2014 0806   AST 14 02/03/2014 0909   ALT 9 04/07/2014 0806   ALT 12 02/03/2014 0909   BILITOT 0.36 04/07/2014 0806   BILITOT <0.2* 02/03/2014 0909     ASSESSMENT & PLAN:  Multiple myeloma  Unfortunately, her blood work suggests she has lack of response with addition of Revlimid. I am stopping Revlimid. She is getting a lot of side effects from it. I am concerned that she may not get that the response of needed to proceed with bone marrow transplant. I am as changing her Velcade injection from weekly to twice a week and will contact her transplant physician at [facility] for further plan of care. I have reviewed with her the national cancer guidelines the potential options include addition of cyclophosphamide, doxorubicin or thalidomide.  Anemia in neoplastic disease This is likely due to recent treatment. The patient denies recent history of bleeding such as epistaxis, hematuria or hematochezia. She is asymptomatic from the anemia. I will observe for now.  She does not require transfusion now. I will continue the chemotherapy at current dose without dosage adjustment.  If the anemia gets progressive worse in the future, I might have to delay her treatment or adjust the chemotherapy dose.   Thrombocytopenia due to drugs This is likely due to recent treatment. The patient denies recent history of bleeding such as epistaxis, hematuria or hematochezia. She is asymptomatic from the low platelet count. I will  observe for now.  she does not require transfusion now.   Diarrhea Diarrhea correlates with the start of Revlimid. She is using Imodium as needed.   No orders of the defined types were placed in this encounter.   All questions were answered. The patient knows to call the clinic with any problems, questions or concerns. No barriers to learning was detected. I spent 40 minutes counseling the patient face to face. The total time spent in the appointment was 55 minutes and more than 50% was on counseling and review of test results     [facility], [place], MD 04/07/2014 9:59 AM

## 2014-04-07 NOTE — Telephone Encounter (Signed)
, °

## 2014-04-07 NOTE — Assessment & Plan Note (Signed)
Diarrhea correlates with the start of Revlimid. She is using Imodium as needed.

## 2014-04-07 NOTE — Assessment & Plan Note (Addendum)
Unfortunately, her blood work suggests she has lack of response with addition of Revlimid. I am stopping Revlimid. She is getting a lot of side effects from it. I am concerned that she may not get that the response of needed to proceed with bone marrow transplant. I am as changing her Velcade injection from weekly to twice a week and will contact her transplant physician at [facility] for further plan of care. I have reviewed with her the national cancer guidelines the potential options include addition of cyclophosphamide, doxorubicin or thalidomide.

## 2014-04-07 NOTE — Telephone Encounter (Signed)
Pt left VM asking for chemo appts on 10/16, 10/23 and 11/06 be moved earlier in day and also add lab on 11/10.  Request sent to scheduler.

## 2014-04-07 NOTE — Patient Instructions (Signed)
[facility] Discharge Instructions for Patients Receiving Chemotherapy  Today you received the following chemotherapy agents Velcade   To help prevent nausea and vomiting after your treatment, we encourage you to take your nausea medication Compazine 10 mg every 6 hours as needed.   If you develop nausea and vomiting that is not controlled by your nausea medication, call the clinic.   BELOW ARE SYMPTOMS THAT SHOULD BE REPORTED IMMEDIATELY:  *FEVER GREATER THAN 100.5 F  *CHILLS WITH OR WITHOUT FEVER  NAUSEA AND VOMITING THAT IS NOT CONTROLLED WITH YOUR NAUSEA MEDICATION  *UNUSUAL SHORTNESS OF BREATH  *UNUSUAL BRUISING OR BLEEDING  TENDERNESS IN MOUTH AND THROAT WITH OR WITHOUT PRESENCE OF ULCERS  *URINARY PROBLEMS  *BOWEL PROBLEMS  UNUSUAL RASH Items with * indicate a potential emergency and should be followed up as soon as possible.  Feel free to call the clinic you have any questions or concerns. The clinic phone number is [phone].    

## 2014-04-07 NOTE — Assessment & Plan Note (Signed)
This is likely due to recent treatment. The patient denies recent history of bleeding such as epistaxis, hematuria or hematochezia. She is asymptomatic from the low platelet count. I will observe for now.  she does not require transfusion now.  

## 2014-04-07 NOTE — Assessment & Plan Note (Signed)
This is likely due to recent treatment. The patient denies recent history of bleeding such as epistaxis, hematuria or hematochezia. She is asymptomatic from the anemia. I will observe for now.  She does not require transfusion now. I will continue the chemotherapy at current dose without dosage adjustment.  If the anemia gets progressive worse in the future, I might have to delay her treatment or adjust the chemotherapy dose.  

## 2014-04-07 NOTE — Progress Notes (Signed)
Ok to treat today with low platelets per Dr. Gorsuch.  

## 2014-04-07 NOTE — Telephone Encounter (Signed)
Per staff message and POF I have scheduled appts. Advised scheduler of appts. JMW  

## 2014-04-08 ENCOUNTER — Telehealth: Payer: Self-pay | Admitting: *Deleted

## 2014-04-08 NOTE — Telephone Encounter (Signed)
LVM informing of schedule changes per pt's request.  Asked her to call back if any problems. Informed of appt on Friday at 9:30 am.

## 2014-04-08 NOTE — Telephone Encounter (Signed)
per staff message from desk RN I have adjsuted appts. Desk RN notified

## 2014-04-09 ENCOUNTER — Telehealth: Payer: Self-pay | Admitting: *Deleted

## 2014-04-09 NOTE — Telephone Encounter (Signed)
Pt asking if Dr. Alvy Bimler has spoken with Dr. Alvie Heidelberg yet about new chemo plan?

## 2014-04-09 NOTE — Telephone Encounter (Signed)
Left pt VM informing her Dr. Alvy Bimler did speak w/ Dr. Alvie Heidelberg today and asked if pt can come in Monday for office visit.  Asked pt to let nurse know when she comes in tomorrow for her chemo.

## 2014-04-09 NOTE — Telephone Encounter (Signed)
Spoke to her, long discussion. Can she come next Monday for discussion?

## 2014-04-10 ENCOUNTER — Other Ambulatory Visit: Payer: Self-pay | Admitting: *Deleted

## 2014-04-10 ENCOUNTER — Ambulatory Visit (HOSPITAL_BASED_OUTPATIENT_CLINIC_OR_DEPARTMENT_OTHER): Payer: Medicare HMO

## 2014-04-10 ENCOUNTER — Telehealth: Payer: Self-pay | Admitting: Hematology and Oncology

## 2014-04-10 VITALS — BP 140/61 | HR 60 | Temp 97.8°F

## 2014-04-10 DIAGNOSIS — G620 Drug-induced polyneuropathy: Secondary | ICD-10-CM

## 2014-04-10 DIAGNOSIS — C9000 Multiple myeloma not having achieved remission: Secondary | ICD-10-CM

## 2014-04-10 DIAGNOSIS — Z5112 Encounter for antineoplastic immunotherapy: Secondary | ICD-10-CM

## 2014-04-10 DIAGNOSIS — T451X5A Adverse effect of antineoplastic and immunosuppressive drugs, initial encounter: Secondary | ICD-10-CM

## 2014-04-10 MED ORDER — BORTEZOMIB CHEMO SQ INJECTION 3.5 MG (2.5MG/ML)
1.3000 mg/m2 | Freq: Once | INTRAMUSCULAR | Status: AC
  Administered 2014-04-10: 2.25 mg via SUBCUTANEOUS
  Filled 2014-04-10: qty 2.25

## 2014-04-10 MED ORDER — ONDANSETRON HCL 8 MG PO TABS
8.0000 mg | ORAL_TABLET | Freq: Once | ORAL | Status: AC
Start: 2014-04-10 — End: 2014-04-10
  Administered 2014-04-10: 8 mg via ORAL

## 2014-04-10 MED ORDER — ONDANSETRON HCL 8 MG PO TABS
ORAL_TABLET | ORAL | Status: AC
  Filled 2014-04-10: qty 1

## 2014-04-10 NOTE — Telephone Encounter (Signed)
added appt per MD per MD pt is aware.Marland KitchenMarland Kitchen

## 2014-04-10 NOTE — Patient Instructions (Signed)
[facility] Discharge Instructions for Patients Receiving Chemotherapy  Today you received the following chemotherapy agents: Velcade.  To help prevent nausea and vomiting after your treatment, we encourage you to take your nausea medication as prescribed.   If you develop nausea and vomiting that is not controlled by your nausea medication, call the clinic.   BELOW ARE SYMPTOMS THAT SHOULD BE REPORTED IMMEDIATELY:  *FEVER GREATER THAN 100.5 F  *CHILLS WITH OR WITHOUT FEVER  NAUSEA AND VOMITING THAT IS NOT CONTROLLED WITH YOUR NAUSEA MEDICATION  *UNUSUAL SHORTNESS OF BREATH  *UNUSUAL BRUISING OR BLEEDING  TENDERNESS IN MOUTH AND THROAT WITH OR WITHOUT PRESENCE OF ULCERS  *URINARY PROBLEMS  *BOWEL PROBLEMS  UNUSUAL RASH Items with * indicate a potential emergency and should be followed up as soon as possible.  Feel free to call the clinic you have any questions or concerns. The clinic phone number is [phone].    

## 2014-04-10 NOTE — Progress Notes (Signed)
Spoke w/ pt in Infusion room.  Informed her Dr. Alvy Bimler will see her in office on Monday 10/19 at 1 pm to discuss treatment plan.   Pt verbalized understanding and will see Dr. Alvy Bimler on Monday.

## 2014-04-13 ENCOUNTER — Telehealth: Payer: Self-pay | Admitting: *Deleted

## 2014-04-13 ENCOUNTER — Ambulatory Visit (HOSPITAL_BASED_OUTPATIENT_CLINIC_OR_DEPARTMENT_OTHER): Payer: Medicare HMO

## 2014-04-13 ENCOUNTER — Telehealth: Payer: Self-pay | Admitting: Hematology and Oncology

## 2014-04-13 ENCOUNTER — Other Ambulatory Visit: Payer: Self-pay | Admitting: Radiology

## 2014-04-13 ENCOUNTER — Ambulatory Visit (HOSPITAL_BASED_OUTPATIENT_CLINIC_OR_DEPARTMENT_OTHER): Payer: Medicare HMO | Admitting: Hematology and Oncology

## 2014-04-13 ENCOUNTER — Telehealth: Payer: Self-pay | Admitting: Nurse Practitioner

## 2014-04-13 VITALS — BP 145/66 | HR 68 | Temp 98.0°F | Resp 20 | Ht 63.0 in | Wt 147.2 lb

## 2014-04-13 DIAGNOSIS — C9000 Multiple myeloma not having achieved remission: Secondary | ICD-10-CM

## 2014-04-13 DIAGNOSIS — D6959 Other secondary thrombocytopenia: Secondary | ICD-10-CM

## 2014-04-13 DIAGNOSIS — T50905A Adverse effect of unspecified drugs, medicaments and biological substances, initial encounter: Secondary | ICD-10-CM

## 2014-04-13 LAB — CBC WITH DIFFERENTIAL/PLATELET
BASO%: 0.5 % (ref 0.0–2.0)
Basophils Absolute: 0 10*3/uL (ref 0.0–0.1)
EOS%: 2.9 % (ref 0.0–7.0)
Eosinophils Absolute: 0.2 10*3/uL (ref 0.0–0.5)
HCT: 36.4 % (ref 34.8–46.6)
HGB: 11.8 g/dL (ref 11.6–15.9)
LYMPH%: 44.8 % (ref 14.0–49.7)
MCH: 31.2 pg (ref 25.1–34.0)
MCHC: 32.4 g/dL (ref 31.5–36.0)
MCV: 96.3 fL (ref 79.5–101.0)
MONO#: 1.4 10*3/uL — ABNORMAL HIGH (ref 0.1–0.9)
MONO%: 23.1 % — ABNORMAL HIGH (ref 0.0–14.0)
NEUT#: 1.8 10*3/uL (ref 1.5–6.5)
NEUT%: 28.7 % — ABNORMAL LOW (ref 38.4–76.8)
Platelets: 30 10*3/uL — ABNORMAL LOW (ref 145–400)
RBC: 3.78 10*6/uL (ref 3.70–5.45)
RDW: 15 % — ABNORMAL HIGH (ref 11.2–14.5)
WBC: 6.3 10*3/uL (ref 3.9–10.3)
lymph#: 2.8 10*3/uL (ref 0.9–3.3)

## 2014-04-13 LAB — COMPREHENSIVE METABOLIC PANEL (CC13)
ALT: 8 U/L (ref 0–55)
AST: 15 U/L (ref 5–34)
Albumin: 3.5 g/dL (ref 3.5–5.0)
Alkaline Phosphatase: 42 U/L (ref 40–150)
Anion Gap: 7 mEq/L (ref 3–11)
BUN: 11.7 mg/dL (ref 7.0–26.0)
CO2: 26 mEq/L (ref 22–29)
Calcium: 9.2 mg/dL (ref 8.4–10.4)
Chloride: 106 mEq/L (ref 98–109)
Creatinine: 0.7 mg/dL (ref 0.6–1.1)
Glucose: 97 mg/dl (ref 70–140)
Potassium: 4.1 mEq/L (ref 3.5–5.1)
Sodium: 139 mEq/L (ref 136–145)
Total Bilirubin: 0.4 mg/dL (ref 0.20–1.20)
Total Protein: 7.5 g/dL (ref 6.4–8.3)

## 2014-04-13 MED ORDER — LIDOCAINE-PRILOCAINE 2.5-2.5 % EX CREA: 1.0000 "application " | TOPICAL_CREAM | CUTANEOUS | Status: DC | PRN

## 2014-04-13 NOTE — Telephone Encounter (Signed)
gv and printed appt sched and avs for pt for OCT thru Dec...sed added tx.

## 2014-04-13 NOTE — Telephone Encounter (Signed)
Patient informed her CBC clotted and need to redraw. Patient on her way from home; should arrive approx 3:45; Kim in lab notified.

## 2014-04-13 NOTE — Progress Notes (Signed)
[place] OFFICE PROGRESS NOTE  Patient Care Team: Jinny Sanders, MD as PCP - General (Family Medicine) [name], MD as Referring Physician (Hematology and Oncology) [name], MD as Consulting Physician (Internal Medicine)  SUMMARY OF ONCOLOGIC HISTORY: Oncology History   Multiple myeloma, Ig A Lambda, M spike 3.54 grams, Calcium 9.2, Creatinine 0.8, Beta 2 microglobulin 4.52, IgA 4840 mg/dL, lambda light chain 75.4, albumin 3.6, hemoglobin 9.7, platelet 115    Primary site: Multiple Myeloma   Staging method: AJCC 6th Edition   Clinical: Stage IIA signed by [name], MD on 11/07/2013  2:46 PM   Summary: Stage IIA        Multiple myeloma   10/31/2013 Bone Marrow Biopsy Bone marrow biopsy confirmed multiple myeloma with 40% bone marrow involvement. Skeletal survey showed minimal lesions in her score with generalized demineralization   11/10/2013 - 02/13/2014 Chemotherapy The patient is started on induction chemotherapy with weekly dexamethasone 40 mg by mouth as well as Velcade subcutaneous injection on days 1, 4, 8 and 11. On 11/21/2013, she was started on monthly Zometa.   12/23/2013 Adverse Reaction The dose of Velcade was reduced due to thrombocytopenia.   01/28/2014 - 04/07/2014 Chemotherapy Revlimid is added. Treatment was discontinued due to lack of response.   02/24/2014 - 04/07/2014 Chemotherapy Due to worsening peripheral neuropathy, Velcade injection is changed to once a week. Revlimid was given 21 days on, 7 days off.   04/07/2014 - 04/10/2014 Chemotherapy Revlimid was discontinued due to lack of response. Chemotherapy was changed back to Velcade injection twice a week, 2 weeks on 1 week off. Her treatment was switched to to minimum response    INTERVAL HISTORY: Please see below for problem oriented charting. She returns today to discuss further treatment recommendation. In the meantime she is not symptomatic.  REVIEW OF SYSTEMS:    Constitutional: Denies fevers, chills or abnormal weight loss Eyes: Denies blurriness of vision Ears, nose, mouth, throat, and face: Denies mucositis or sore throat Respiratory: Denies cough, dyspnea or wheezes Cardiovascular: Denies palpitation, chest discomfort or lower extremity swelling Gastrointestinal:  Denies nausea, heartburn or change in bowel habits Skin: Denies abnormal skin rashes Lymphatics: Denies new lymphadenopathy or easy bruising Neurological:Denies numbness, tingling or new weaknesses Behavioral/Psych: Mood is stable, no new changes  All other systems were reviewed with the patient and are negative.  I have reviewed the past medical history, past surgical history, social history and family history with the patient and they are unchanged from previous note.  ALLERGIES:  is allergic to penicillins and sulfa antibiotics.  MEDICATIONS:  Current Outpatient Prescriptions  Medication Sig Dispense Refill  . acyclovir (ZOVIRAX) 400 MG tablet TAKE 1 TABLET (400 MG TOTAL) BY MOUTH 2 (TWO) TIMES DAILY.  60 tablet  3  . ALPRAZolam (XANAX) 0.5 MG tablet Take 1 tablet (0.5 mg total) by mouth 2 (two) times daily as needed for anxiety.  30 tablet  0  . carvedilol (COREG) 6.25 MG tablet Take 0.5 tablets (3.125 mg total) by mouth 2 (two) times daily with a meal.  90 tablet  3  . cholecalciferol (VITAMIN D) 1000 UNITS tablet Take 1,000 Units by mouth daily.      Marland Kitchen dexamethasone (DECADRON) 4 MG tablet TAKE 10 TABLETS BY MOUTH ONCE A WEEK ON MONDAY MORNINGS WITH FOOD  100 tablet  1  . docusate sodium (COLACE) 100 MG capsule Take 100 mg by mouth 2 (two) times daily.      Marland Kitchen loperamide (  IMODIUM A-D) 2 MG tablet Take 2 mg by mouth 4 (four) times daily as needed for diarrhea or loose stools.      . mirtazapine (REMERON) 30 MG tablet Take 1 tablet (30 mg total) by mouth at bedtime.  60 tablet  1  . Multiple Vitamins-Minerals (CENTRUM SILVER ULTRA WOMENS PO) Take by mouth daily.      Marland Kitchen  lidocaine-prilocaine (EMLA) cream Apply 1 application topically as needed.  30 g  6   No current facility-administered medications for this visit.    PHYSICAL EXAMINATION: ECOG PERFORMANCE STATUS: 0 - Asymptomatic  Filed Vitals:   04/13/14 1259  BP: 145/66  Pulse: 68  Temp: 98 F (36.7 C)  Resp: 20   Filed Weights   04/13/14 1259  Weight: 147 lb 3.2 oz (66.769 kg)    GENERAL:alert, no distress and comfortable SKIN: skin color, texture, turgor are normal, no rashes or significant lesions EYES: normal, Conjunctiva are pink and non-injected, sclera clear Musculoskeletal:no cyanosis of digits and no clubbing  NEURO: alert & oriented x 3 with fluent speech, no focal motor/sensory deficits  LABORATORY DATA:  I have reviewed the data as listed    Component Value Date/Time   NA 139 04/13/2014 1357   NA 141 02/03/2014 0909   K 4.1 04/13/2014 1357   K 3.8 02/03/2014 0909   CL 101 02/03/2014 0909   CO2 26 04/13/2014 1357   CO2 25 02/03/2014 0909   GLUCOSE 97 04/13/2014 1357   GLUCOSE 108* 02/03/2014 0909   BUN 11.7 04/13/2014 1357   BUN 18 02/03/2014 0909   CREATININE 0.7 04/13/2014 1357   CREATININE 0.68 02/03/2014 0909   CALCIUM 9.2 04/13/2014 1357   CALCIUM 9.2 02/03/2014 0909   PROT 7.5 04/13/2014 1357   PROT 8.5* 02/03/2014 0909   ALBUMIN 3.5 04/13/2014 1357   ALBUMIN 3.5 02/03/2014 0909   AST 15 04/13/2014 1357   AST 14 02/03/2014 0909   ALT 8 04/13/2014 1357   ALT 12 02/03/2014 0909   ALKPHOS 42 04/13/2014 1357   ALKPHOS 53 02/03/2014 0909   BILITOT 0.40 04/13/2014 1357   BILITOT <0.2* 02/03/2014 0909    No results found for this basename: SPEP, UPEP,  kappa and lambda light chains    Lab Results  Component Value Date   WBC 6.3 04/13/2014   NEUTROABS 1.8 04/13/2014   HGB 11.8 04/13/2014   HCT 36.4 04/13/2014   MCV 96.3 04/13/2014   PLT 30* 04/13/2014      Chemistry      Component Value Date/Time   NA 139 04/13/2014 1357   NA 141 02/03/2014 0909   K 4.1  04/13/2014 1357   K 3.8 02/03/2014 0909   CL 101 02/03/2014 0909   CO2 26 04/13/2014 1357   CO2 25 02/03/2014 0909   BUN 11.7 04/13/2014 1357   BUN 18 02/03/2014 0909   CREATININE 0.7 04/13/2014 1357   CREATININE 0.68 02/03/2014 0909      Component Value Date/Time   CALCIUM 9.2 04/13/2014 1357   CALCIUM 9.2 02/03/2014 0909   ALKPHOS 42 04/13/2014 1357   ALKPHOS 53 02/03/2014 0909   AST 15 04/13/2014 1357   AST 14 02/03/2014 0909   ALT 8 04/13/2014 1357   ALT 12 02/03/2014 0909   BILITOT 0.40 04/13/2014 1357   BILITOT <0.2* 02/03/2014 0909      ASSESSMENT & PLAN:  Multiple myeloma I had a long discussion with the transplant physician. The patient has minimum response with the  addition of Revlimid and plateaud response with the current dose of Velcade. I reviewed the most current national guidelines. We are in agreement to proceed with her Carfilzomib, cyclophosphamide and dexamethasone in an attempt to get the deeper response to treatment. I would recommend 2 cycles of treatment and reassess. If she has good response to treatment, she can then proceed with autologous stem cell transplant. The risks, benefits, side effects of Carfilzomib and cyclophosphamide were discussed with the patient and she agreed to proceed. She will continue weekly pulsed dexamethasone 20 mg by mouth prior to treatment on Mondays and monthly Zometa. She will also continue prophylaxis treatment with acyclovir. I recommend placement of port for venous access. I will reorder blood work today.  Thrombocytopenia due to drugs This is likely due to recent treatment. The patient denies recent history of bleeding such as epistaxis, hematuria or hematochezia. She is asymptomatic from the low platelet count. I will observe for now.  she does not require transfusion now.      Orders Placed This Encounter  Procedures  . IR Fluoro Guide CV Line Right    Port for chemo    Standing Status: Future     Number of  Occurrences:      Standing Expiration Date: 06/14/2015    Order Specific Question:  Reason for exam:    Answer:  need port for chemo    Order Specific Question:  Preferred Imaging Location?    Answer:  [facility]  . Troponin I    Standing Status: Future     Number of Occurrences:      Standing Expiration Date: 04/13/2015  . Pro BNP (brain natriuretic peptide)    Standing Status: Future     Number of Occurrences:      Standing Expiration Date: 04/13/2015   All questions were answered. The patient knows to call the clinic with any problems, questions or concerns. No barriers to learning was detected. I spent 30 minutes counseling the patient face to face. The total time spent in the appointment was 40 minutes and more than 50% was on counseling and review of test results     [facility] [place], MD 04/13/2014 4:26 PM

## 2014-04-13 NOTE — Assessment & Plan Note (Signed)
This is likely due to recent treatment. The patient denies recent history of bleeding such as epistaxis, hematuria or hematochezia. She is asymptomatic from the low platelet count. I will observe for now.  she does not require transfusion now.  

## 2014-04-13 NOTE — Assessment & Plan Note (Addendum)
I had a long discussion with the transplant physician. The patient has minimum response with the addition of Revlimid and plateaud response with the current dose of Velcade. I reviewed the most current national guidelines. We are in agreement to proceed with her Carfilzomib, cyclophosphamide and dexamethasone in an attempt to get the deeper response to treatment. I would recommend 2 cycles of treatment and reassess. If she has good response to treatment, she can then proceed with autologous stem cell transplant. The risks, benefits, side effects of Carfilzomib and cyclophosphamide were discussed with the patient and she agreed to proceed. She will continue weekly pulsed dexamethasone 20 mg by mouth prior to treatment on Mondays and monthly Zometa. She will also continue prophylaxis treatment with acyclovir. I recommend placement of port for venous access. I will reorder blood work today.

## 2014-04-13 NOTE — Telephone Encounter (Signed)
Informed pt of Platelets count 30 and need to r/a [facility] placement for another week.  Keep appt Monday 10/26 for lab and chemo.  Pt can get chemo using peripheral IV.  Radiology will call her w/ new appt for PAC.  Hopefully her Platelet count will recover enough in another week to have the surgery.  Otherwise we will r/s again.  Pt verbalized understanding.  VM for IR requesting they r/s PAC from this week to the end of next week to allow pt's platelet count to improve.

## 2014-04-14 ENCOUNTER — Ambulatory Visit: Payer: Medicare HMO

## 2014-04-14 ENCOUNTER — Telehealth: Payer: Self-pay | Admitting: *Deleted

## 2014-04-14 ENCOUNTER — Other Ambulatory Visit: Payer: Medicare HMO

## 2014-04-14 NOTE — Telephone Encounter (Signed)
Pt left a message requesting to change appointment on 11/30 and 12/1 to 12/1 and 12/2. OK to change per Dr Alvy Bimler

## 2014-04-15 ENCOUNTER — Telehealth: Payer: Self-pay | Admitting: *Deleted

## 2014-04-15 NOTE — Telephone Encounter (Signed)
Pt requests chemo appt on Monday 10/26 be moved to earlier in day and closer to lab appt which is at 9 am. Message sent to [place].

## 2014-04-15 NOTE — Telephone Encounter (Signed)
Informed pt of lab appt moved to later in day but unable to move chemo earlier.  She verbalized understanding and has seen the schedule change on MyChart.  Pt also asked if she is to continue to take her Decadron weekly on Tuesdays.  Instructed pt continue weekly decadron per Dr. Alvy Bimler.  She verbalized understanding.

## 2014-04-16 ENCOUNTER — Other Ambulatory Visit (HOSPITAL_COMMUNITY): Payer: Medicare HMO

## 2014-04-16 ENCOUNTER — Ambulatory Visit (HOSPITAL_COMMUNITY): Payer: Medicare HMO

## 2014-04-17 ENCOUNTER — Ambulatory Visit: Payer: Medicare HMO

## 2014-04-20 ENCOUNTER — Ambulatory Visit (HOSPITAL_BASED_OUTPATIENT_CLINIC_OR_DEPARTMENT_OTHER): Payer: Medicare HMO

## 2014-04-20 ENCOUNTER — Other Ambulatory Visit: Payer: Self-pay | Admitting: Hematology and Oncology

## 2014-04-20 ENCOUNTER — Other Ambulatory Visit (HOSPITAL_BASED_OUTPATIENT_CLINIC_OR_DEPARTMENT_OTHER): Payer: Medicare HMO

## 2014-04-20 VITALS — BP 123/51 | HR 79 | Temp 97.7°F | Resp 18

## 2014-04-20 DIAGNOSIS — G620 Drug-induced polyneuropathy: Secondary | ICD-10-CM

## 2014-04-20 DIAGNOSIS — T451X5A Adverse effect of antineoplastic and immunosuppressive drugs, initial encounter: Secondary | ICD-10-CM

## 2014-04-20 DIAGNOSIS — C9000 Multiple myeloma not having achieved remission: Secondary | ICD-10-CM

## 2014-04-20 DIAGNOSIS — Z5112 Encounter for antineoplastic immunotherapy: Secondary | ICD-10-CM

## 2014-04-20 LAB — COMPREHENSIVE METABOLIC PANEL (CC13)
ALT: 10 U/L (ref 0–55)
AST: 12 U/L (ref 5–34)
Albumin: 3.5 g/dL (ref 3.5–5.0)
Alkaline Phosphatase: 42 U/L (ref 40–150)
Anion Gap: 9 mEq/L (ref 3–11)
BUN: 13.6 mg/dL (ref 7.0–26.0)
CO2: 28 mEq/L (ref 22–29)
Calcium: 9.4 mg/dL (ref 8.4–10.4)
Chloride: 104 mEq/L (ref 98–109)
Creatinine: 0.8 mg/dL (ref 0.6–1.1)
Glucose: 91 mg/dl (ref 70–140)
Potassium: 3.9 mEq/L (ref 3.5–5.1)
Sodium: 140 mEq/L (ref 136–145)
Total Bilirubin: 0.37 mg/dL (ref 0.20–1.20)
Total Protein: 7.6 g/dL (ref 6.4–8.3)

## 2014-04-20 LAB — CBC WITH DIFFERENTIAL/PLATELET
BASO%: 0.8 % (ref 0.0–2.0)
Basophils Absolute: 0.1 10*3/uL (ref 0.0–0.1)
EOS%: 0.9 % (ref 0.0–7.0)
Eosinophils Absolute: 0.1 10*3/uL (ref 0.0–0.5)
HCT: 34.2 % — ABNORMAL LOW (ref 34.8–46.6)
HGB: 11.3 g/dL — ABNORMAL LOW (ref 11.6–15.9)
LYMPH%: 34.8 % (ref 14.0–49.7)
MCH: 32 pg (ref 25.1–34.0)
MCHC: 32.9 g/dL (ref 31.5–36.0)
MCV: 97.1 fL (ref 79.5–101.0)
MONO#: 1.4 10*3/uL — ABNORMAL HIGH (ref 0.1–0.9)
MONO%: 19.1 % — ABNORMAL HIGH (ref 0.0–14.0)
NEUT#: 3.1 10*3/uL (ref 1.5–6.5)
NEUT%: 44.4 % (ref 38.4–76.8)
Platelets: 110 10*3/uL — ABNORMAL LOW (ref 145–400)
RBC: 3.52 10*6/uL — ABNORMAL LOW (ref 3.70–5.45)
RDW: 15.2 % — ABNORMAL HIGH (ref 11.2–14.5)
WBC: 7.1 10*3/uL (ref 3.9–10.3)
lymph#: 2.5 10*3/uL (ref 0.9–3.3)

## 2014-04-20 LAB — PRO B NATRIURETIC PEPTIDE: Pro B Natriuretic peptide (BNP): 169.4 pg/mL — ABNORMAL HIGH (ref <126)

## 2014-04-20 LAB — TROPONIN I: Troponin I: <0.3 ng/mL (ref <0.30)

## 2014-04-20 MED ORDER — DEXTROSE 5 % IV SOLN
20.0000 mg/m2 | Freq: Once | INTRAVENOUS | Status: AC
  Administered 2014-04-20: 34 mg via INTRAVENOUS
  Filled 2014-04-20: qty 17

## 2014-04-20 MED ORDER — DEXAMETHASONE SODIUM PHOSPHATE 10 MG/ML IJ SOLN
10.0000 mg | Freq: Once | INTRAMUSCULAR | Status: AC
  Administered 2014-04-20: 10 mg via INTRAVENOUS

## 2014-04-20 MED ORDER — ONDANSETRON 8 MG/NS 50 ML IVPB
INTRAVENOUS | Status: AC
  Filled 2014-04-20: qty 8

## 2014-04-20 MED ORDER — ONDANSETRON 8 MG/50ML IVPB (CHCC)
8.0000 mg | Freq: Once | INTRAVENOUS | Status: AC
  Administered 2014-04-20: 8 mg via INTRAVENOUS

## 2014-04-20 MED ORDER — SODIUM CHLORIDE 0.9 % IV SOLN
Freq: Once | INTRAVENOUS | Status: AC
  Administered 2014-04-20: 14:00:00 via INTRAVENOUS

## 2014-04-20 MED ORDER — SODIUM CHLORIDE 0.9 % IV SOLN
Freq: Once | INTRAVENOUS | Status: AC
  Administered 2014-04-20: 16:00:00 via INTRAVENOUS

## 2014-04-20 MED ORDER — CYCLOPHOSPHAMIDE CHEMO INJECTION 1 GM
300.0000 mg/m2 | Freq: Once | INTRAMUSCULAR | Status: AC
  Administered 2014-04-20: 520 mg via INTRAVENOUS
  Filled 2014-04-20: qty 26

## 2014-04-20 MED ORDER — DEXAMETHASONE SODIUM PHOSPHATE 10 MG/ML IJ SOLN
INTRAMUSCULAR | Status: AC
  Filled 2014-04-20: qty 1

## 2014-04-20 NOTE — Patient Instructions (Signed)
[facility] Discharge Instructions for Patients Receiving Chemotherapy  Today you received the following chemotherapy agents Kyprolis and Cytoxan.   To help prevent nausea and vomiting after your treatment, we encourage you to take your nausea medication.   If you develop nausea and vomiting that is not controlled by your nausea medication, call the clinic.   BELOW ARE SYMPTOMS THAT SHOULD BE REPORTED IMMEDIATELY:  *FEVER GREATER THAN 100.5 F  *CHILLS WITH OR WITHOUT FEVER  NAUSEA AND VOMITING THAT IS NOT CONTROLLED WITH YOUR NAUSEA MEDICATION  *UNUSUAL SHORTNESS OF BREATH  *UNUSUAL BRUISING OR BLEEDING  TENDERNESS IN MOUTH AND THROAT WITH OR WITHOUT PRESENCE OF ULCERS  *URINARY PROBLEMS  *BOWEL PROBLEMS  UNUSUAL RASH Items with * indicate a potential emergency and should be followed up as soon as possible.  Feel free to call the clinic you have any questions or concerns. The clinic phone number is [phone].   Carfilzomib injection What is this medicine? CARFILZOMIB (kar FILZ oh mib) is a chemotherapy drug that works by slowing or stopping cancer cell growth. This medicine is used to treat multiple myeloma. This medicine may be used for other purposes; ask your health care provider or pharmacist if you have questions. COMMON BRAND NAME(S): KYPROLIS What should I tell my health care provider before I take this medicine? They need to know if you have any of these conditions: -heart disease -irregular heartbeat -liver disease -lung or breathing disease -an unusual or allergic reaction to carfilzomib, or other medicines, foods, dyes, or preservatives -pregnant or trying to get pregnant -breast-feeding How should I use this medicine? This medicine is for injection or infusion into a vein. It is given by a health care professional in a hospital or clinic setting. Talk to your pediatrician regarding the use of this medicine in children. Special care may  be needed. Overdosage: If you think you've taken too much of this medicine contact a poison control center or emergency room at once. Overdosage: If you think you have taken too much of this medicine contact a poison control center or emergency room at once. NOTE: This medicine is only for you. Do not share this medicine with others. What if I miss a dose? It is important not to miss your dose. Call your doctor or health care professional if you are unable to keep an appointment. What may interact with this medicine? Interactions are not expected. Give your health care provider a list of all the medicines, herbs, non-prescription drugs, or dietary supplements you use. Also tell them if you smoke, drink alcohol, or use illegal drugs. Some items may interact with your medicine. This list may not describe all possible interactions. Give your health care provider a list of all the medicines, herbs, non-prescription drugs, or dietary supplements you use. Also tell them if you smoke, drink alcohol, or use illegal drugs. Some items may interact with your medicine. What should I watch for while using this medicine? Your condition will be monitored carefully while you are receiving this medicine. Report any side effects. Continue your course of treatment even though you feel ill unless your doctor tells you to stop. Call your doctor or health care professional for advice if you get a fever, chills or sore throat, or other symptoms of a cold or flu. Do not treat yourself. Try to avoid being around people who are sick. Do not become pregnant while taking this medicine. Women should inform their doctor if they wish to become pregnant  or think they might be pregnant. There is a potential for serious side effects to an unborn child. Talk to your health care professional or pharmacist for more information. Do not breast-feed an infant while taking this medicine. Check with your doctor or health care professional if  you get an attack of severe diarrhea, nausea and vomiting, or if you sweat a lot. The loss of too much body fluid can make it dangerous for you to take this medicine. You may get dizzy. Do not drive, use machinery, or do anything that needs mental alertness until you know how this medicine affects you. Do not stand or sit up quickly, especially if you are an older patient. This reduces the risk of dizzy or fainting spells. What side effects may I notice from receiving this medicine? Side effects that you should report to your doctor or health care professional as soon as possible: -allergic reactions like skin rash, itching or hives, swelling of the face, lips, or tongue -breathing problems -chest pain or palpitationschest tightness -cough -dark urine -dizziness -feeling faint or lightheaded -fever or chills -general ill feeling or flu-like symptoms -light-colored stools -palpitations -right upper belly pain -swelling of the legs or ankles -unusual bleeding or bruising -unusually weak or tired -yellowing of the eyes or skin Side effects that usually do not require medical attention (Report these to your doctor or health care professional if they continue or are bothersome.): -diarrhea -headache -nausea, vomiting -tiredness This list may not describe all possible side effects. Call your doctor for medical advice about side effects. You may report side effects to FDA at 1-800-FDA-1088. Where should I keep my medicine? This drug is given in a hospital or clinic and will not be stored at home. NOTE: This sheet is a summary. It may not cover all possible information. If you have questions about this medicine, talk to your doctor, pharmacist, or health care provider.  2015, Elsevier/Gold Standard. (2011-12-01 17:02:29)  Cyclophosphamide injection What is this medicine? CYCLOPHOSPHAMIDE (sye kloe FOSS fa mide) is a chemotherapy drug. It slows the growth of cancer cells. This medicine is  used to treat many types of cancer like lymphoma, myeloma, leukemia, breast cancer, and ovarian cancer, to name a few. This medicine may be used for other purposes; ask your health care provider or pharmacist if you have questions. COMMON BRAND NAME(S): Cytoxan, Neosar What should I tell my health care provider before I take this medicine? They need to know if you have any of these conditions: -blood disorders -history of other chemotherapy -infection -kidney disease -liver disease -recent or ongoing radiation therapy -tumors in the bone marrow -an unusual or allergic reaction to cyclophosphamide, other chemotherapy, other medicines, foods, dyes, or preservatives -pregnant or trying to get pregnant -breast-feeding How should I use this medicine? This drug is usually given as an injection into a vein or muscle or by infusion into a vein. It is administered in a hospital or clinic by a specially trained health care professional. Talk to your pediatrician regarding the use of this medicine in children. Special care may be needed. Overdosage: If you think you have taken too much of this medicine contact a poison control center or emergency room at once. NOTE: This medicine is only for you. Do not share this medicine with others. What if I miss a dose? It is important not to miss your dose. Call your doctor or health care professional if you are unable to keep an appointment. What may interact with this  medicine? This medicine may interact with the following medications: -amiodarone -amphotericin B -azathioprine -certain antiviral medicines for HIV or AIDS such as protease inhibitors (e.g., indinavir, ritonavir) and zidovudine -certain blood pressure medications such as benazepril, captopril, enalapril, fosinopril, lisinopril, moexipril, monopril, perindopril, quinapril, ramipril, trandolapril -certain cancer medications such as anthracyclines (e.g., daunorubicin, doxorubicin), busulfan,  cytarabine, paclitaxel, pentostatin, tamoxifen, trastuzumab -certain diuretics such as chlorothiazide, chlorthalidone, hydrochlorothiazide, indapamide, metolazone -certain medicines that treat or prevent blood clots like warfarin -certain muscle relaxants such as succinylcholine -cyclosporine -etanercept -indomethacin -medicines to increase blood counts like filgrastim, pegfilgrastim, sargramostim -medicines used as general anesthesia -metronidazole -natalizumab This list may not describe all possible interactions. Give your health care provider a list of all the medicines, herbs, non-prescription drugs, or dietary supplements you use. Also tell them if you smoke, drink alcohol, or use illegal drugs. Some items may interact with your medicine. What should I watch for while using this medicine? Visit your doctor for checks on your progress. This drug may make you feel generally unwell. This is not uncommon, as chemotherapy can affect healthy cells as well as cancer cells. Report any side effects. Continue your course of treatment even though you feel ill unless your doctor tells you to stop. Drink water or other fluids as directed. Urinate often, even at night. In some cases, you may be given additional medicines to help with side effects. Follow all directions for their use. Call your doctor or health care professional for advice if you get a fever, chills or sore throat, or other symptoms of a cold or flu. Do not treat yourself. This drug decreases your body's ability to fight infections. Try to avoid being around people who are sick. This medicine may increase your risk to bruise or bleed. Call your doctor or health care professional if you notice any unusual bleeding. Be careful brushing and flossing your teeth or using a toothpick because you may get an infection or bleed more easily. If you have any dental work done, tell your dentist you are receiving this medicine. You may get drowsy or  dizzy. Do not drive, use machinery, or do anything that needs mental alertness until you know how this medicine affects you. Do not become pregnant while taking this medicine or for 1 year after stopping it. Women should inform their doctor if they wish to become pregnant or think they might be pregnant. Men should not father a child while taking this medicine and for 4 months after stopping it. There is a potential for serious side effects to an unborn child. Talk to your health care professional or pharmacist for more information. Do not breast-feed an infant while taking this medicine. This medicine may interfere with the ability to have a child. This medicine has caused ovarian failure in some women. This medicine has caused reduced sperm counts in some men. You should talk with your doctor or health care professional if you are concerned about your fertility. If you are going to have surgery, tell your doctor or health care professional that you have taken this medicine. What side effects may I notice from receiving this medicine? Side effects that you should report to your doctor or health care professional as soon as possible: -allergic reactions like skin rash, itching or hives, swelling of the face, lips, or tongue -low blood counts - this medicine may decrease the number of white blood cells, red blood cells and platelets. You may be at increased risk for infections and bleeding. -signs of  infection - fever or chills, cough, sore throat, pain or difficulty passing urine -signs of decreased platelets or bleeding - bruising, pinpoint red spots on the skin, black, tarry stools, blood in the urine -signs of decreased red blood cells - unusually weak or tired, fainting spells, lightheadedness -breathing problems -dark urine -dizziness -palpitations -swelling of the ankles, feet, hands -trouble passing urine or change in the amount of urine -weight gain -yellowing of the eyes or skin Side  effects that usually do not require medical attention (report to your doctor or health care professional if they continue or are bothersome): -changes in nail or skin color -hair loss -missed menstrual periods -mouth sores -nausea, vomiting This list may not describe all possible side effects. Call your doctor for medical advice about side effects. You may report side effects to FDA at 1-800-FDA-1088. Where should I keep my medicine? This drug is given in a hospital or clinic and will not be stored at home. NOTE: This sheet is a summary. It may not cover all possible information. If you have questions about this medicine, talk to your doctor, pharmacist, or health care provider.  2015, [employer]vier/Gold Standard. (2012-04-26 16:22:58)

## 2014-04-21 ENCOUNTER — Ambulatory Visit: Payer: Medicare HMO

## 2014-04-21 ENCOUNTER — Other Ambulatory Visit: Payer: Self-pay | Admitting: Radiology

## 2014-04-21 ENCOUNTER — Ambulatory Visit: Payer: Medicare HMO | Admitting: Hematology and Oncology

## 2014-04-21 ENCOUNTER — Telehealth: Payer: Self-pay | Admitting: *Deleted

## 2014-04-21 ENCOUNTER — Ambulatory Visit (HOSPITAL_BASED_OUTPATIENT_CLINIC_OR_DEPARTMENT_OTHER): Payer: Medicare HMO

## 2014-04-21 ENCOUNTER — Other Ambulatory Visit: Payer: Medicare HMO

## 2014-04-21 VITALS — BP 141/49 | HR 77 | Temp 98.3°F | Resp 18

## 2014-04-21 DIAGNOSIS — T451X5A Adverse effect of antineoplastic and immunosuppressive drugs, initial encounter: Secondary | ICD-10-CM

## 2014-04-21 DIAGNOSIS — G620 Drug-induced polyneuropathy: Secondary | ICD-10-CM

## 2014-04-21 DIAGNOSIS — C9000 Multiple myeloma not having achieved remission: Secondary | ICD-10-CM

## 2014-04-21 DIAGNOSIS — Z5112 Encounter for antineoplastic immunotherapy: Secondary | ICD-10-CM

## 2014-04-21 MED ORDER — DEXAMETHASONE SODIUM PHOSPHATE 10 MG/ML IJ SOLN
INTRAMUSCULAR | Status: AC
  Filled 2014-04-21: qty 1

## 2014-04-21 MED ORDER — SODIUM CHLORIDE 0.9 % IV SOLN
Freq: Once | INTRAVENOUS | Status: AC
  Administered 2014-04-21: 11:00:00 via INTRAVENOUS

## 2014-04-21 MED ORDER — DEXAMETHASONE SODIUM PHOSPHATE 10 MG/ML IJ SOLN
10.0000 mg | Freq: Once | INTRAMUSCULAR | Status: AC
  Administered 2014-04-21: 10 mg via INTRAVENOUS

## 2014-04-21 MED ORDER — SODIUM CHLORIDE 0.9 % IV SOLN
Freq: Once | INTRAVENOUS | Status: AC
  Administered 2014-04-21: 10:00:00 via INTRAVENOUS

## 2014-04-21 MED ORDER — DEXTROSE 5 % IV SOLN
20.0000 mg/m2 | Freq: Once | INTRAVENOUS | Status: AC
  Administered 2014-04-21: 34 mg via INTRAVENOUS
  Filled 2014-04-21: qty 17

## 2014-04-21 MED ORDER — ONDANSETRON 8 MG/50ML IVPB (CHCC)
8.0000 mg | Freq: Once | INTRAVENOUS | Status: AC
  Administered 2014-04-21: 8 mg via INTRAVENOUS

## 2014-04-21 MED ORDER — ONDANSETRON 8 MG/NS 50 ML IVPB
INTRAVENOUS | Status: AC
  Filled 2014-04-21: qty 8

## 2014-04-21 MED ORDER — ZOLEDRONIC ACID 4 MG/100ML IV SOLN
4.0000 mg | Freq: Once | INTRAVENOUS | Status: AC
  Administered 2014-04-21: 4 mg via INTRAVENOUS
  Filled 2014-04-21: qty 100

## 2014-04-21 NOTE — Patient Instructions (Signed)
[facility] Discharge Instructions for Patients Receiving Chemotherapy  Today you received the following chemotherapy agents Kyprolis.  To help prevent nausea and vomiting after your treatment, we encourage you to take your nausea medication as prescribed.   If you develop nausea and vomiting that is not controlled by your nausea medication, call the clinic.   BELOW ARE SYMPTOMS THAT SHOULD BE REPORTED IMMEDIATELY:  *FEVER GREATER THAN 100.5 F  *CHILLS WITH OR WITHOUT FEVER  NAUSEA AND VOMITING THAT IS NOT CONTROLLED WITH YOUR NAUSEA MEDICATION  *UNUSUAL SHORTNESS OF BREATH  *UNUSUAL BRUISING OR BLEEDING  TENDERNESS IN MOUTH AND THROAT WITH OR WITHOUT PRESENCE OF ULCERS  *URINARY PROBLEMS  *BOWEL PROBLEMS  UNUSUAL RASH Items with * indicate a potential emergency and should be followed up as soon as possible.  Feel free to call the clinic you have any questions or concerns. The clinic phone number is [phone].   Zoledronic Acid injection (Hypercalcemia, Oncology) What is this medicine? ZOLEDRONIC ACID (ZOE le dron ik AS id) lowers the amount of calcium loss from bone. It is used to treat too much calcium in your blood from cancer. It is also used to prevent complications of cancer that has spread to the bone. This medicine may be used for other purposes; ask your health care provider or pharmacist if you have questions. COMMON BRAND NAME(S): Zometa What should I tell my health care provider before I take this medicine? They need to know if you have any of these conditions: -aspirin-sensitive asthma -cancer, especially if you are receiving medicines used to treat cancer -dental disease or wear dentures -infection -kidney disease -receiving corticosteroids like dexamethasone or prednisone -an unusual or allergic reaction to zoledronic acid, other medicines, foods, dyes, or preservatives -pregnant or trying to get pregnant -breast-feeding How should I  use this medicine? This medicine is for infusion into a vein. It is given by a health care professional in a hospital or clinic setting. Talk to your pediatrician regarding the use of this medicine in children. Special care may be needed. Overdosage: If you think you have taken too much of this medicine contact a poison control center or emergency room at once. NOTE: This medicine is only for you. Do not share this medicine with others. What if I miss a dose? It is important not to miss your dose. Call your doctor or health care professional if you are unable to keep an appointment. What may interact with this medicine? -certain antibiotics given by injection -NSAIDs, medicines for pain and inflammation, like ibuprofen or naproxen -some diuretics like bumetanide, furosemide -teriparatide -thalidomide This list may not describe all possible interactions. Give your health care provider a list of all the medicines, herbs, non-prescription drugs, or dietary supplements you use. Also tell them if you smoke, drink alcohol, or use illegal drugs. Some items may interact with your medicine. What should I watch for while using this medicine? Visit your doctor or health care professional for regular checkups. It may be some time before you see the benefit from this medicine. Do not stop taking your medicine unless your doctor tells you to. Your doctor may order blood tests or other tests to see how you are doing. Women should inform their doctor if they wish to become pregnant or think they might be pregnant. There is a potential for serious side effects to an unborn child. Talk to your health care professional or pharmacist for more information. You should make sure that you get   enough calcium and vitamin D while you are taking this medicine. Discuss the foods you eat and the vitamins you take with your health care professional. Some people who take this medicine have severe bone, joint, and/or muscle pain.  This medicine may also increase your risk for jaw problems or a broken thigh bone. Tell your doctor right away if you have severe pain in your jaw, bones, joints, or muscles. Tell your doctor if you have any pain that does not go away or that gets worse. Tell your dentist and dental surgeon that you are taking this medicine. You should not have major dental surgery while on this medicine. See your dentist to have a dental exam and fix any dental problems before starting this medicine. Take good care of your teeth while on this medicine. Make sure you see your dentist for regular follow-up appointments. What side effects may I notice from receiving this medicine? Side effects that you should report to your doctor or health care professional as soon as possible: -allergic reactions like skin rash, itching or hives, swelling of the face, lips, or tongue -anxiety, confusion, or depression -breathing problems -changes in vision -eye pain -feeling faint or lightheaded, falls -jaw pain, especially after dental work -mouth sores -muscle cramps, stiffness, or weakness -trouble passing urine or change in the amount of urine Side effects that usually do not require medical attention (report to your doctor or health care professional if they continue or are bothersome): -bone, joint, or muscle pain -constipation -diarrhea -fever -hair loss -irritation at site where injected -loss of appetite -nausea, vomiting -stomach upset -trouble sleeping -trouble swallowing -weak or tired This list may not describe all possible side effects. Call your doctor for medical advice about side effects. You may report side effects to FDA at 1-800-FDA-1088. Where should I keep my medicine? This drug is given in a hospital or clinic and will not be stored at home. NOTE: This sheet is a summary. It may not cover all possible information. If you have questions about this medicine, talk to your doctor, pharmacist, or  health care provider.  2015, Elsevier/Gold Standard. (2012-11-21 13:03:13)  

## 2014-04-21 NOTE — Telephone Encounter (Signed)
Pt asks  1. Why she had to have special cardiac labs done?  2. Ok to take weekly dexamethasone even though she is given Dex 10 mg IV pre chemo?  3. Is it safe to drive herself to and from chemo appts? 4. Why is her chemo called "salvage" in the literature? this is confusing to pt.  She asks if her cancer was "refractory" or "resistent?" Pt here for chemo today.

## 2014-04-21 NOTE — Telephone Encounter (Signed)
Cardiac labs per protocol for Carfilzomib. Continue weekly Dex even she is given prechemo OK to drive Salvage is a term used for situations like this Consider her condition is somewhat refractory due to lack of deeper response

## 2014-04-21 NOTE — Telephone Encounter (Signed)
Informed pt of Dr. Calton Dach reply to her questions below. She verbalized understanding.

## 2014-04-22 ENCOUNTER — Telehealth: Payer: Self-pay | Admitting: *Deleted

## 2014-04-22 ENCOUNTER — Encounter (HOSPITAL_COMMUNITY): Payer: Self-pay | Admitting: Pharmacy Technician

## 2014-04-22 NOTE — Telephone Encounter (Signed)
Message copied by Tania Ade on Wed Apr 22, 2014 11:14 AM ------      Message from: Jaci Carrel A      Created: Tue Apr 21, 2014 11:14 AM      Regarding: chemo follow up call       First time Kyprolis and Cytoxan. Dr Alvy Bimler. Here 10/27 for 2nd day Kyprolis ------

## 2014-04-22 NOTE — Telephone Encounter (Signed)
Only adverse event is facial flushing from steroids. No questions or concerns

## 2014-04-23 ENCOUNTER — Other Ambulatory Visit: Payer: Self-pay | Admitting: Hematology and Oncology

## 2014-04-23 ENCOUNTER — Ambulatory Visit (HOSPITAL_COMMUNITY)
Admission: RE | Admit: 2014-04-23 | Discharge: 2014-04-23 | Disposition: A | Discharge status: Home / Self Care | Payer: Medicare HMO | Source: Ambulatory Visit | Attending: Hematology and Oncology | Admitting: Hematology and Oncology

## 2014-04-23 ENCOUNTER — Encounter (HOSPITAL_COMMUNITY): Payer: Self-pay

## 2014-04-23 DIAGNOSIS — Z87891 Personal history of nicotine dependence: Secondary | ICD-10-CM | POA: Diagnosis not present

## 2014-04-23 DIAGNOSIS — Z452 Encounter for adjustment and management of vascular access device: Secondary | ICD-10-CM | POA: Insufficient documentation

## 2014-04-23 DIAGNOSIS — Z79899 Other long term (current) drug therapy: Secondary | ICD-10-CM | POA: Diagnosis not present

## 2014-04-23 DIAGNOSIS — C9000 Multiple myeloma not having achieved remission: Secondary | ICD-10-CM | POA: Diagnosis not present

## 2014-04-23 LAB — PROTIME-INR
INR: 1.12 (ref 0.00–1.49)
Prothrombin Time: 14.6 seconds (ref 11.6–15.2)

## 2014-04-23 LAB — BASIC METABOLIC PANEL
Anion gap: 11 (ref 5–15)
BUN: 16 mg/dL (ref 6–23)
CO2: 26 mEq/L (ref 19–32)
Calcium: 8.4 mg/dL (ref 8.4–10.5)
Chloride: 103 mEq/L (ref 96–112)
Creatinine, Ser: 0.69 mg/dL (ref 0.50–1.10)
GFR calc Af Amer: >90 mL/min (ref >90)
GFR calc non Af Amer: 88 mL/min — ABNORMAL LOW (ref >90)
Glucose, Bld: 81 mg/dL (ref 70–99)
Potassium: 3.9 mEq/L (ref 3.7–5.3)
Sodium: 140 mEq/L (ref 137–147)

## 2014-04-23 LAB — CBC
HCT: 33.2 % — ABNORMAL LOW (ref 36.0–46.0)
Hemoglobin: 10.7 g/dL — ABNORMAL LOW (ref 12.0–15.0)
MCH: 31.9 pg (ref 26.0–34.0)
MCHC: 32.2 g/dL (ref 30.0–36.0)
MCV: 99.1 fL (ref 78.0–100.0)
Platelets: 66 10*3/uL — ABNORMAL LOW (ref 150–400)
RBC: 3.35 MIL/uL — ABNORMAL LOW (ref 3.87–5.11)
RDW: 14.8 % (ref 11.5–15.5)
WBC: 5.7 10*3/uL (ref 4.0–10.5)

## 2014-04-23 LAB — APTT: aPTT: 31 seconds (ref 24–37)

## 2014-04-23 MED ORDER — HEPARIN SOD (PORK) LOCK FLUSH 100 UNIT/ML IV SOLN: 500.0000 [IU] | Freq: Once | INTRAVENOUS | Status: DC

## 2014-04-23 MED ORDER — FENTANYL CITRATE 0.05 MG/ML IJ SOLN
INTRAMUSCULAR | Status: AC | PRN
  Administered 2014-04-23 (×2): 50 ug via INTRAVENOUS

## 2014-04-23 MED ORDER — LIDOCAINE-EPINEPHRINE 2 %-1:100000 IJ SOLN
INTRAMUSCULAR | Status: AC
  Filled 2014-04-23: qty 1

## 2014-04-23 MED ORDER — SODIUM CHLORIDE 0.9 % IV SOLN
Freq: Once | INTRAVENOUS | Status: AC
  Administered 2014-04-23: 12:00:00 via INTRAVENOUS

## 2014-04-23 MED ORDER — VANCOMYCIN HCL IN DEXTROSE 1-5 GM/200ML-% IV SOLN
1000.0000 mg | Freq: Once | INTRAVENOUS | Status: AC
  Administered 2014-04-23: 1000 mg via INTRAVENOUS
  Filled 2014-04-23: qty 200

## 2014-04-23 MED ORDER — MIDAZOLAM HCL 2 MG/2ML IJ SOLN
INTRAMUSCULAR | Status: AC | PRN
  Administered 2014-04-23: 2 mg via INTRAVENOUS

## 2014-04-23 MED ORDER — MIDAZOLAM HCL 2 MG/2ML IJ SOLN
INTRAMUSCULAR | Status: AC
  Filled 2014-04-23: qty 4

## 2014-04-23 MED ORDER — HEPARIN SOD (PORK) LOCK FLUSH 100 UNIT/ML IV SOLN
INTRAVENOUS | Status: AC
  Filled 2014-04-23: qty 5

## 2014-04-23 MED ORDER — FENTANYL CITRATE 0.05 MG/ML IJ SOLN
INTRAMUSCULAR | Status: AC
  Filled 2014-04-23: qty 4

## 2014-04-23 MED ORDER — LIDOCAINE HCL 1 % IJ SOLN
INTRAMUSCULAR | Status: AC
  Filled 2014-04-23: qty 20

## 2014-04-23 NOTE — H&P (Signed)
Chief Complaint: Multiple Myeloma  Referring Physician(s): Gorsuch,Ni  History of Present Illness: Jean Davidson is a 68 y.o. female  Pt with MM Need for Port a Cath placement for chemo   Past Medical History  Diagnosis Date  . Cervical disc disease     c7  . Spinal stenosis in cervical region   . Carpal tunnel syndrome   . Pinched nerve   . Varicose veins of lower extremities with inflammation   . Disc degeneration   . Hypertension     Borderline.  . Carotid stenosis     Mild bilateral  . Anemia   . Anxiety   . GERD (gastroesophageal reflux disease)   . Cancer     Past Surgical History  Procedure Laterality Date  . Colonoscopy    . Bladder tact  1988/1989    x2  . Shoulder surgery Right 04/06/13    right    Allergies: Penicillins and Sulfa antibiotics  Medications: Prior to Admission medications   Medication Sig Start Date End Date Taking? Authorizing Provider  acyclovir (ZOVIRAX) 400 MG tablet Take 400 mg by mouth 2 (two) times daily.   Yes Historical Provider, MD  carvedilol (COREG) 6.25 MG tablet Take 3.125 mg by mouth 2 (two) times daily with a meal.   Yes Historical Provider, MD  cholecalciferol (VITAMIN D) 1000 UNITS tablet Take 1,000 Units by mouth daily.   Yes Historical Provider, MD  docusate sodium (COLACE) 100 MG capsule Take 100 mg by mouth 2 (two) times daily.   Yes Historical Provider, MD  loperamide (IMODIUM A-D) 2 MG tablet Take 2 mg by mouth 4 (four) times daily as needed for diarrhea or loose stools.   Yes Historical Provider, MD  mirtazapine (REMERON) 30 MG tablet Take 15 mg by mouth at bedtime.   Yes Historical Provider, MD  Multiple Vitamin (MULTIVITAMIN WITH MINERALS) TABS tablet Take 1 tablet by mouth daily.   Yes Historical Provider, MD  CARFILZOMIB IV Inject into the vein 2 (two) times a week.    Historical Provider, MD  Cyclophosphamide (CYTOXAN IJ) Inject as directed once a week.    Historical Provider, MD  dexamethasone  (DECADRON) 4 MG tablet Take 20 mg by mouth once a week.    Historical Provider, MD  lidocaine-prilocaine (EMLA) cream Apply 1 application topically as needed. 04/13/14   [name], MD  ondansetron (ZOFRAN) 8 MG tablet Take 8 mg by mouth every 8 (eight) hours as needed for nausea or vomiting.    Historical Provider, MD  Zoledronic Acid (ZOMETA IV) Inject into the vein every 30 (thirty) days.    Historical Provider, MD    Family History  Problem Relation Age of Onset  . Heart disease Mother   . Heart failure Father   . Heart disease Father   . Hypertension Brother   . Heart disease Brother     congenital shunt, ?   . Colon cancer Paternal Aunt 63  . Throat cancer Father   . Skin cancer Father   . Skin cancer Brother   . Diabetes Father   . Kidney disease Father     History   Social History  . Marital Status: Divorced    Spouse Name: N/A    Number of Children: N/A  . Years of Education: N/A   Social History Main Topics  . Smoking status: Former Smoker -- 0.50 packs/day for 4 years    Quit date: 06/26/1968  . Smokeless tobacco: Never Used  .  Alcohol Use: 4.8 oz/week    8 Cans of beer per week     Comment: minimal  . Drug Use: No  . Sexual Activity: None   Other Topics Concern  . None   Social History Narrative   HCPOA is daughter adely, facer will,  full code ( reviewed 2015)    Review of Systems: A 12 point ROS discussed and pertinent positives are indicated in the HPI above.  All other systems are negative.  Review of Systems  Constitutional: Positive for fatigue. Negative for activity change.  Respiratory: Negative for cough and shortness of breath.   Cardiovascular: Negative for chest pain.  Gastrointestinal: Negative for abdominal pain.  Musculoskeletal: Negative for back pain.  Neurological: Positive for weakness. Negative for dizziness.  Psychiatric/Behavioral: Negative for behavioral problems and confusion.    Vital Signs: BP 147/70  Pulse  79  Temp(Src) 98.2 F (36.8 C) (Oral)  Resp 18  Ht _0  (1.6 m)  Wt 66.679 kg (147 lb)  BMI 26.05 kg/m2  SpO2 100%  Physical Exam  Constitutional: She is oriented to person, place, and time. She appears well-developed.  Cardiovascular: Normal rate and regular rhythm.   No murmur heard. Pulmonary/Chest: Effort normal and breath sounds normal. She has no wheezes.  Abdominal: Soft. Bowel sounds are normal. There is no tenderness.  Musculoskeletal: Normal range of motion.  Rt shoulder pain Residual pain from surgery yr ago  Neurological: She is alert and oriented to person, place, and time.  Skin: Skin is warm and dry.  Psychiatric: She has a normal mood and affect. Her behavior is normal. Judgment and thought content normal.    Imaging: No results found.  Labs:  CBC:  Recent Labs  04/07/14 0805 04/13/14 1520 04/20/14 1305 04/23/14 1208  WBC 6.0 6.3 7.1 5.7  HGB 11.2* 11.8 11.3* 10.7*  HCT 34.4* 36.4 34.2* 33.2*  PLT 65* 30* 110* 66*    COAGS:  Recent Labs  04/23/14 1208  INR 1.12  APTT 31    BMP:  Recent Labs  08/08/13 1127 10/28/13 0955  02/03/14 0909  04/07/14 0806 04/13/14 1357 04/20/14 1305 04/23/14 1208  NA 137 138  < > 141  < > 143 139 140 140  K 4.2 3.9  < > 3.8  < > 3.8 4.1 3.9 3.9  CL 101 99  --  101  --   --   --   --  103  CO2 26 28  < > 25  < > _1 GLUCOSE 97 100*  < > 108*  < > 76 97 91 81  BUN 13 11  < > 18  < > 7.4 11.7 13.6 16  CALCIUM 9.1 9.3  < > 9.2  < > 8.7 9.2 9.4 8.4  CREATININE 0.7 0.8  < > 0.68  < > 0.7 0.7 0.8 0.69  GFRNONAA  --   --   --   --   --   --   --   --  32*  GFRAA  --   --   --   --   --   --   --   --  >90  < > = values in this interval not displayed.  LIVER FUNCTION TESTS:  Recent Labs  03/31/14 0957 04/07/14 0806 04/13/14 1357 04/20/14 1305  BILITOT 0.36 0.36 0.40 0.37  AST _2 ALT _3 ALKPHOS 37* 41 42 42  PROT 7.6 7.8 7.5 7.6  ALBUMIN 3.3* 3.4* 3.5 3.5    TUMOR  MARKERS: No results found for this basename: AFPTM, CEA, CA199, CHROMGRNA,  in the last 8760 hours  Assessment and Plan:  Multiple Myeloma Now for [facility] placement Pt aware of procedure benefits and risks and agreeable to proceed Consent signed andin chart  Thank you for this interesting consult.  I greatly enjoyed meeting Jean Davidson and look forward to participating in their care.     I spent a total of 20 minutes face to face in clinical consultation, greater than 50% of which was counseling/coordinating care for [facility] a Cath placement  Signed: Sakiya Stepka A 04/23/2014, 1:06 PM

## 2014-04-23 NOTE — Procedures (Signed)
Interventional Radiology Procedure Note  Procedure: Placement of a right IJ approach single lumen PowerPort.  Tip is positioned at the superior cavoatrial junction and catheter is ready for immediate use.  Complications: No immediate Recommendations:  - Ok to shower tomorrow - Do not submerge for 7 days - Routine line care   Signed,  Heath K. McCullough, MD   

## 2014-04-23 NOTE — Discharge Instructions (Signed)
Leave dressing on for 24 hours.  You may shower after 24 hours.  Please remove the dressing before you shower.   ° °Implanted Port Home Guide °An implanted port is a type of central line that is placed under the skin. Central lines are used to provide IV access when treatment or nutrition needs to be given through a person's veins. Implanted ports are used for long-term IV access. An implanted port may be placed because:  °· You need IV medicine that would be irritating to the small veins in your hands or arms.   °· You need long-term IV medicines, such as antibiotics.   °· You need IV nutrition for a long period.   °· You need frequent blood draws for lab tests.   °· You need dialysis.   °Implanted ports are usually placed in the chest area, but they can also be placed in the upper arm, the abdomen, or the leg. An implanted port has two main parts:  °· Reservoir. The reservoir is round and will appear as a small, raised area under your skin. The reservoir is the part where a needle is inserted to give medicines or draw blood.   °· Catheter. The catheter is a thin, flexible tube that extends from the reservoir. The catheter is placed into a large vein. Medicine that is inserted into the reservoir goes into the catheter and then into the vein.   °HOW WILL I CARE FOR MY INCISION SITE? °Do not get the incision site wet. Bathe or shower as directed by your health care provider.  °HOW IS MY PORT ACCESSED? °Special steps must be taken to access the port:  °· Before the port is accessed, a numbing cream can be placed on the skin. This helps numb the skin over the port site.   °· Your health care provider uses a sterile technique to access the port. °¨ Your health care provider must put on a mask and sterile gloves. °¨ The skin over your port is cleaned carefully with an antiseptic and allowed to dry. °¨ The port is gently pinched between sterile gloves, and a needle is inserted into the port. °· Only "non-coring" port  needles should be used to access the port. Once the port is accessed, a blood return should be checked. This helps ensure that the port is in the vein and is not clogged.   °· If your port needs to remain accessed for a constant infusion, a clear (transparent) bandage will be placed over the needle site. The bandage and needle will need to be changed every week, or as directed by your health care provider.   °· Keep the bandage covering the needle clean and dry. Do not get it wet. Follow your health care provider's instructions on how to take a shower or bath while the port is accessed.   °· If your port does not need to stay accessed, no bandage is needed over the port.   °WHAT IS FLUSHING? °Flushing helps keep the port from getting clogged. Follow your health care provider's instructions on how and when to flush the port. Ports are usually flushed with saline solution or a medicine called heparin. The need for flushing will depend on how the port is used.  °· If the port is used for intermittent medicines or blood draws, the port will need to be flushed:   °¨ After medicines have been given.   °¨ After blood has been drawn.   °¨ As part of routine maintenance.   °· If a constant infusion is running, the port may not   need to be flushed.   °HOW LONG WILL MY PORT STAY IMPLANTED? °The port can stay in for as long as your health care provider thinks it is needed. When it is time for the port to come out, surgery will be done to remove it. The procedure is similar to the one performed when the port was put in.  °WHEN SHOULD I SEEK IMMEDIATE MEDICAL CARE? °When you have an implanted port, you should seek immediate medical care if:  °· You notice a bad smell coming from the incision site.   °· You have swelling, redness, or drainage at the incision site.   °· You have more swelling or pain at the port site or the surrounding area.   °· You have a fever that is not controlled with medicine. °Document Released: 06/12/2005  Document Revised: 04/02/2013 Document Reviewed: 02/17/2013 °ExitCare® Patient Information ©2015 [employer]. This information is not intended to replace advice given to you by your health care provider. Make sure you discuss any questions you have with your health care provider. ° °Conscious Sedation, Adult, Care After °Refer to this sheet in the next few weeks. These instructions provide you with information on caring for yourself after your procedure. Your health care provider may also give you more specific instructions. Your treatment has been planned according to current medical practices, but problems sometimes occur. Call your health care provider if you have any problems or questions after your procedure. °WHAT TO EXPECT AFTER THE PROCEDURE  °After your procedure: °· You may feel sleepy, clumsy, and have poor balance for several hours. °· Vomiting may occur if you eat too soon after the procedure. °HOME CARE INSTRUCTIONS °· Do not participate in any activities where you could become injured for at least 24 hours. Do not: °¨ Drive. °¨ Swim. °¨ Ride a bicycle. °¨ Operate heavy machinery. °¨ Cook. °¨ Use power tools. °¨ Climb ladders. °¨ Work from a high place. °· Do not make important decisions or sign legal documents until you are improved. °· If you vomit, drink water, juice, or soup when you can drink without vomiting. Make sure you have little or no nausea before eating solid foods. °· Only take over-the-counter or prescription medicines for pain, discomfort, or fever as directed by your health care provider. °· Make sure you and your family fully understand everything about the medicines given to you, including what side effects may occur. °· You should not drink alcohol, take sleeping pills, or take medicines that cause drowsiness for at least 24 hours. °· If you smoke, do not smoke without supervision. °· If you are feeling better, you may resume normal activities 24 hours after you were  sedated. °· Keep all appointments with your health care provider. °SEEK MEDICAL CARE IF: °· Your skin is pale or bluish in color. °· You continue to feel nauseous or vomit. °· Your pain is getting worse and is not helped by medicine. °· You have bleeding or swelling. °· You are still sleepy or feeling clumsy after 24 hours. °SEEK IMMEDIATE MEDICAL CARE IF: °· You develop a rash. °· You have difficulty breathing. °· You develop any type of allergic problem. °· You have a fever. °MAKE SURE YOU: °· Understand these instructions. °· Will watch your condition. °· Will get help right away if you are not doing well or get worse. °Document Released: 04/02/2013 Document Reviewed: 04/02/2013 °ExitCare® Patient Information ©2015 [employer]. This information is not intended to replace advice given to you by your health care   provider. Make sure you discuss any questions you have with your health care provider. °Implanted Port Insertion, Care After °Refer to this sheet in the next few weeks. These instructions provide you with information on caring for yourself after your procedure. Your health care provider may also give you more specific instructions. Your treatment has been planned according to current medical practices, but problems sometimes occur. Call your health care provider if you have any problems or questions after your procedure. °WHAT TO EXPECT AFTER THE PROCEDURE °After your procedure, it is typical to have the following:  °· Discomfort at the port insertion site. Ice packs to the area will help. °· Bruising on the skin over the port. This will subside in 3-4 days. °HOME CARE INSTRUCTIONS °· After your port is placed, you will get a manufacturer's information card. The card has information about your port. Keep this card with you at all times.   °· Know what kind of port you have. There are many types of ports available.   °· Wear a medical alert bracelet in case of an emergency. This can help alert health care  workers that you have a port.   °· The port can stay in for as long as your health care provider believes it is necessary.   °· A home health care nurse may give medicines and take care of the port.   °· You or a family member can get special training and directions for giving medicine and taking care of the port at home.   °SEEK MEDICAL CARE IF:  °· Your port does not flush or you are unable to get a blood return.   °· You have a fever or chills. °SEEK IMMEDIATE MEDICAL CARE IF: °· You have new fluid or pus coming from your incision.   °· You notice a bad smell coming from your incision site.   °· You have swelling, pain, or more redness at the incision or port site.   °· You have chest pain or shortness of breath. °Document Released: 04/02/2013 Document Revised: 06/17/2013 Document Reviewed: 04/02/2013 °ExitCare® Patient Information ©2015 [employer]. This information is not intended to replace advice given to you by your health care provider. Make sure you discuss any questions you have with your health care provider. ° °

## 2014-04-23 NOTE — H&P (Signed)
Chief Complaint: Multiple Myeloma  Referring Physician(s): Gorsuch,Ni  History of Present Illness: Jean Davidson is a 68 y.o. female  Pt with MM Need for Port a Cath placement for chemo   Past Medical History  Diagnosis Date  . Cervical disc disease     c7  . Spinal stenosis in cervical region   . Carpal tunnel syndrome   . Pinched nerve   . Varicose veins of lower extremities with inflammation   . Disc degeneration   . Hypertension     Borderline.  . Carotid stenosis     Mild bilateral  . Anemia   . Anxiety   . GERD (gastroesophageal reflux disease)   . Cancer     Past Surgical History  Procedure Laterality Date  . Colonoscopy    . Bladder tact  1988/1989    x2  . Shoulder surgery Right 04/06/13    right    Allergies: Penicillins and Sulfa antibiotics  Medications: Prior to Admission medications   Medication Sig Start Date End Date Taking? Authorizing Provider  acyclovir (ZOVIRAX) 400 MG tablet Take 400 mg by mouth 2 (two) times daily.   Yes Historical Provider, MD  carvedilol (COREG) 6.25 MG tablet Take 3.125 mg by mouth 2 (two) times daily with a meal.   Yes Historical Provider, MD  cholecalciferol (VITAMIN D) 1000 UNITS tablet Take 1,000 Units by mouth daily.   Yes Historical Provider, MD  docusate sodium (COLACE) 100 MG capsule Take 100 mg by mouth 2 (two) times daily.   Yes Historical Provider, MD  loperamide (IMODIUM A-D) 2 MG tablet Take 2 mg by mouth 4 (four) times daily as needed for diarrhea or loose stools.   Yes Historical Provider, MD  mirtazapine (REMERON) 30 MG tablet Take 15 mg by mouth at bedtime.   Yes Historical Provider, MD  Multiple Vitamin (MULTIVITAMIN WITH MINERALS) TABS tablet Take 1 tablet by mouth daily.   Yes Historical Provider, MD  CARFILZOMIB IV Inject into the vein 2 (two) times a week.    Historical Provider, MD  Cyclophosphamide (CYTOXAN IJ) Inject as directed once a week.    Historical Provider, MD  dexamethasone  (DECADRON) 4 MG tablet Take 20 mg by mouth once a week.    Historical Provider, MD  lidocaine-prilocaine (EMLA) cream Apply 1 application topically as needed. 04/13/14   Velvia Mehrer Lark, MD  ondansetron (ZOFRAN) 8 MG tablet Take 8 mg by mouth every 8 (eight) hours as needed for nausea or vomiting.    Historical Provider, MD  Zoledronic Acid (ZOMETA IV) Inject into the vein every 30 (thirty) days.    Historical Provider, MD    Family History  Problem Relation Age of Onset  . Heart disease Mother   . Heart failure Father   . Heart disease Father   . Hypertension Brother   . Heart disease Brother     congenital shunt, ?   . Colon cancer Paternal Aunt 63  . Throat cancer Father   . Skin cancer Father   . Skin cancer Brother   . Diabetes Father   . Kidney disease Father     History   Social History  . Marital Status: Divorced    Spouse Name: N/A    Number of Children: N/A  . Years of Education: N/A   Social History Main Topics  . Smoking status: Former Smoker -- 0.50 packs/day for 4 years    Quit date: 06/26/1968  . Smokeless tobacco: Never Used  .  Alcohol Use: 4.8 oz/week    8 Cans of beer per week     Comment: minimal  . Drug Use: No  . Sexual Activity: None   Other Topics Concern  . None   Social History Narrative   HCPOA is daughter adely, facer will,  full code ( reviewed 2015)    Review of Systems: A 12 point ROS discussed and pertinent positives are indicated in the HPI above.  All other systems are negative.  Review of Systems  Constitutional: Positive for fatigue. Negative for activity change.  Respiratory: Negative for cough and shortness of breath.   Cardiovascular: Negative for chest pain.  Gastrointestinal: Negative for abdominal pain.  Musculoskeletal: Negative for back pain.  Neurological: Positive for weakness. Negative for dizziness.  Psychiatric/Behavioral: Negative for behavioral problems and confusion.    Vital Signs: BP 147/70  Pulse  79  Temp(Src) 98.2 F (36.8 C) (Oral)  Resp 18  Ht _0  (1.6 m)  Wt 66.679 kg (147 lb)  BMI 26.05 kg/m2  SpO2 100%  Physical Exam  Constitutional: She is oriented to person, place, and time. She appears well-developed.  Cardiovascular: Normal rate and regular rhythm.   No murmur heard. Pulmonary/Chest: Effort normal and breath sounds normal. She has no wheezes.  Abdominal: Soft. Bowel sounds are normal. There is no tenderness.  Musculoskeletal: Normal range of motion.  Rt shoulder pain Residual pain from surgery yr ago  Neurological: She is alert and oriented to person, place, and time.  Skin: Skin is warm and dry.  Psychiatric: She has a normal mood and affect. Her behavior is normal. Judgment and thought content normal.    Imaging: No results found.  Labs:  CBC:  Recent Labs  04/07/14 0805 04/13/14 1520 04/20/14 1305 04/23/14 1208  WBC 6.0 6.3 7.1 5.7  HGB 11.2* 11.8 11.3* 10.7*  HCT 34.4* 36.4 34.2* 33.2*  PLT 65* 30* 110* 66*    COAGS:  Recent Labs  04/23/14 1208  INR 1.12  APTT 31    BMP:  Recent Labs  08/08/13 1127 10/28/13 0955  02/03/14 0909  04/07/14 0806 04/13/14 1357 04/20/14 1305 04/23/14 1208  NA 137 138  < > 141  < > 143 139 140 140  K 4.2 3.9  < > 3.8  < > 3.8 4.1 3.9 3.9  CL 101 99  --  101  --   --   --   --  103  CO2 26 28  < > 25  < > _1 GLUCOSE 97 100*  < > 108*  < > 76 97 91 81  BUN 13 11  < > 18  < > 7.4 11.7 13.6 16  CALCIUM 9.1 9.3  < > 9.2  < > 8.7 9.2 9.4 8.4  CREATININE 0.7 0.8  < > 0.68  < > 0.7 0.7 0.8 0.69  GFRNONAA  --   --   --   --   --   --   --   --  32*  GFRAA  --   --   --   --   --   --   --   --  >90  < > = values in this interval not displayed.  LIVER FUNCTION TESTS:  Recent Labs  03/31/14 0957 04/07/14 0806 04/13/14 1357 04/20/14 1305  BILITOT 0.36 0.36 0.40 0.37  AST _2 ALT _3 ALKPHOS 37* 41 42 42  PROT 7.6 7.8 7.5 7.6  ALBUMIN 3.3* 3.4* 3.5 3.5    TUMOR  MARKERS: No results found for this basename: AFPTM, CEA, CA199, CHROMGRNA,  in the last 8760 hours  Assessment and Plan:  Multiple Myeloma Now for [facility] placement Pt aware of procedure benefits and risks and agreeable to proceed Consent signed andin chart  Thank you for this interesting consult.  I greatly enjoyed meeting TATIJANA BIERLY and look forward to participating in their care.     I spent a total of 20 minutes face to face in clinical consultation, greater than 50% of which was counseling/coordinating care for [facility] a Cath placement  Signed: TURPIN,PAMELA A 04/23/2014, 1:06 PM  Pt seen & examined.  Agree with PA note above, will proceed as planned.   Signed,  Criselda Peaches, MD

## 2014-04-27 ENCOUNTER — Telehealth: Payer: Self-pay | Admitting: Hematology and Oncology

## 2014-04-27 ENCOUNTER — Ambulatory Visit (HOSPITAL_BASED_OUTPATIENT_CLINIC_OR_DEPARTMENT_OTHER): Payer: Medicare HMO

## 2014-04-27 ENCOUNTER — Ambulatory Visit (HOSPITAL_BASED_OUTPATIENT_CLINIC_OR_DEPARTMENT_OTHER): Payer: Medicare HMO | Admitting: Hematology and Oncology

## 2014-04-27 ENCOUNTER — Encounter: Payer: Self-pay | Admitting: Hematology and Oncology

## 2014-04-27 ENCOUNTER — Other Ambulatory Visit (HOSPITAL_BASED_OUTPATIENT_CLINIC_OR_DEPARTMENT_OTHER): Payer: Medicare HMO

## 2014-04-27 VITALS — BP 142/71 | HR 76 | Temp 98.9°F | Resp 18 | Ht 63.0 in | Wt 147.2 lb

## 2014-04-27 DIAGNOSIS — G620 Drug-induced polyneuropathy: Secondary | ICD-10-CM

## 2014-04-27 DIAGNOSIS — C9000 Multiple myeloma not having achieved remission: Secondary | ICD-10-CM

## 2014-04-27 DIAGNOSIS — T451X5A Adverse effect of antineoplastic and immunosuppressive drugs, initial encounter: Secondary | ICD-10-CM

## 2014-04-27 DIAGNOSIS — D630 Anemia in neoplastic disease: Secondary | ICD-10-CM

## 2014-04-27 DIAGNOSIS — T50905A Adverse effect of unspecified drugs, medicaments and biological substances, initial encounter: Secondary | ICD-10-CM

## 2014-04-27 DIAGNOSIS — R197 Diarrhea, unspecified: Secondary | ICD-10-CM

## 2014-04-27 DIAGNOSIS — D6959 Other secondary thrombocytopenia: Secondary | ICD-10-CM

## 2014-04-27 DIAGNOSIS — Z5112 Encounter for antineoplastic immunotherapy: Secondary | ICD-10-CM

## 2014-04-27 LAB — COMPREHENSIVE METABOLIC PANEL (CC13)
ALT: 10 U/L (ref 0–55)
AST: 11 U/L (ref 5–34)
Albumin: 3.7 g/dL (ref 3.5–5.0)
Alkaline Phosphatase: 41 U/L (ref 40–150)
Anion Gap: 8 mEq/L (ref 3–11)
BUN: 9.5 mg/dL (ref 7.0–26.0)
CO2: 25 mEq/L (ref 22–29)
Calcium: 9.3 mg/dL (ref 8.4–10.4)
Chloride: 107 mEq/L (ref 98–109)
Creatinine: 0.8 mg/dL (ref 0.6–1.1)
Glucose: 90 mg/dl (ref 70–140)
Potassium: 4.3 mEq/L (ref 3.5–5.1)
Sodium: 140 mEq/L (ref 136–145)
Total Bilirubin: 0.43 mg/dL (ref 0.20–1.20)
Total Protein: 7.2 g/dL (ref 6.4–8.3)

## 2014-04-27 LAB — CBC WITH DIFFERENTIAL/PLATELET
BASO%: 0.1 % (ref 0.0–2.0)
Basophils Absolute: 0 10*3/uL (ref 0.0–0.1)
EOS%: 1.4 % (ref 0.0–7.0)
Eosinophils Absolute: 0.1 10*3/uL (ref 0.0–0.5)
HCT: 35 % (ref 34.8–46.6)
HGB: 11.4 g/dL — ABNORMAL LOW (ref 11.6–15.9)
LYMPH%: 29.9 % (ref 14.0–49.7)
MCH: 31.6 pg (ref 25.1–34.0)
MCHC: 32.6 g/dL (ref 31.5–36.0)
MCV: 97 fL (ref 79.5–101.0)
MONO#: 1.8 10*3/uL — ABNORMAL HIGH (ref 0.1–0.9)
MONO%: 25.8 % — ABNORMAL HIGH (ref 0.0–14.0)
NEUT#: 3 10*3/uL (ref 1.5–6.5)
NEUT%: 42.8 % (ref 38.4–76.8)
Platelets: 94 10*3/uL — ABNORMAL LOW (ref 145–400)
RBC: 3.61 10*6/uL — ABNORMAL LOW (ref 3.70–5.45)
RDW: 14.7 % — ABNORMAL HIGH (ref 11.2–14.5)
WBC: 7.1 10*3/uL (ref 3.9–10.3)
lymph#: 2.1 10*3/uL (ref 0.9–3.3)

## 2014-04-27 MED ORDER — HEPARIN SOD (PORK) LOCK FLUSH 100 UNIT/ML IV SOLN
500.0000 [IU] | Freq: Once | INTRAVENOUS | Status: AC | PRN
  Administered 2014-04-27: 500 [IU]
  Filled 2014-04-27: qty 5

## 2014-04-27 MED ORDER — DEXAMETHASONE SODIUM PHOSPHATE 10 MG/ML IJ SOLN
10.0000 mg | Freq: Once | INTRAMUSCULAR | Status: AC
  Administered 2014-04-27: 10 mg via INTRAVENOUS

## 2014-04-27 MED ORDER — DEXAMETHASONE SODIUM PHOSPHATE 10 MG/ML IJ SOLN
INTRAMUSCULAR | Status: AC
  Filled 2014-04-27: qty 1

## 2014-04-27 MED ORDER — SODIUM CHLORIDE 0.9 % IV SOLN
Freq: Once | INTRAVENOUS | Status: AC
  Administered 2014-04-27: 10:00:00 via INTRAVENOUS

## 2014-04-27 MED ORDER — DEXTROSE 5 % IV SOLN: 60.0000 mg | Freq: Once | INTRAVENOUS | Status: DC

## 2014-04-27 MED ORDER — ACYCLOVIR 400 MG PO TABS: 400.0000 mg | ORAL_TABLET | Freq: Two times a day (BID) | ORAL | Status: DC

## 2014-04-27 MED ORDER — ONDANSETRON 8 MG/NS 50 ML IVPB
INTRAVENOUS | Status: AC
  Filled 2014-04-27: qty 8

## 2014-04-27 MED ORDER — SODIUM CHLORIDE 0.9 % IJ SOLN
10.0000 mL | INTRAMUSCULAR | Status: DC | PRN
  Administered 2014-04-27: 10 mL
  Filled 2014-04-27: qty 10

## 2014-04-27 MED ORDER — SODIUM CHLORIDE 0.9 % IV SOLN
300.0000 mg/m2 | Freq: Once | INTRAVENOUS | Status: AC
  Administered 2014-04-27: 520 mg via INTRAVENOUS
  Filled 2014-04-27: qty 26

## 2014-04-27 MED ORDER — DEXTROSE 5 % IV SOLN
60.0000 mg | Freq: Once | INTRAVENOUS | Status: AC
  Administered 2014-04-27: 60 mg via INTRAVENOUS
  Filled 2014-04-27: qty 30

## 2014-04-27 MED ORDER — DEXTROSE 5 % IV SOLN
36.0000 mg/m2 | Freq: Once | INTRAVENOUS | Status: DC
  Filled 2014-04-27: qty 31

## 2014-04-27 MED ORDER — ONDANSETRON 8 MG/50ML IVPB (CHCC)
8.0000 mg | Freq: Once | INTRAVENOUS | Status: AC
  Administered 2014-04-27: 8 mg via INTRAVENOUS

## 2014-04-27 NOTE — Telephone Encounter (Signed)
gv adn printed appt sched and avs for pt for NOV adn Dec...sed added tx. °

## 2014-04-27 NOTE — Patient Instructions (Signed)
[facility] Discharge Instructions for Patients Receiving Chemotherapy  Today you received the following chemotherapy agents Kyprolis/Cytoxan.   To help prevent nausea and vomiting after your treatment, we encourage you to take your nausea medication as directed.    If you develop nausea and vomiting that is not controlled by your nausea medication, call the clinic.   BELOW ARE SYMPTOMS THAT SHOULD BE REPORTED IMMEDIATELY:  *FEVER GREATER THAN 100.5 F  *CHILLS WITH OR WITHOUT FEVER  NAUSEA AND VOMITING THAT IS NOT CONTROLLED WITH YOUR NAUSEA MEDICATION  *UNUSUAL SHORTNESS OF BREATH  *UNUSUAL BRUISING OR BLEEDING  TENDERNESS IN MOUTH AND THROAT WITH OR WITHOUT PRESENCE OF ULCERS  *URINARY PROBLEMS  *BOWEL PROBLEMS  UNUSUAL RASH Items with * indicate a potential emergency and should be followed up as soon as possible.  Feel free to call the clinic you have any questions or concerns. The clinic phone number is [phone].    

## 2014-04-27 NOTE — Assessment & Plan Note (Signed)
She continues to have mild intermittent diarrhea, resolve with Imodium as needed.

## 2014-04-27 NOTE — Assessment & Plan Note (Signed)
This is likely due to recent treatment. The patient denies recent history of bleeding such as epistaxis, hematuria or hematochezia. She is asymptomatic from the low platelet count. I will observe for now.  she does not require transfusion now.  

## 2014-04-27 NOTE — Progress Notes (Signed)
[facility] OFFICE PROGRESS NOTE  Patient Care Team: Jinny Sanders, MD as PCP - General (Family Medicine) [name], MD as Referring Physician (Hematology and Oncology) [name], MD as Consulting Physician (Internal Medicine)  SUMMARY OF ONCOLOGIC HISTORY: Oncology History   Multiple myeloma, Ig A Lambda, M spike 3.54 grams, Calcium 9.2, Creatinine 0.8, Beta 2 microglobulin 4.52, IgA 4840 mg/dL, lambda light chain 75.4, albumin 3.6, hemoglobin 9.7, platelet 115    Primary site: Multiple Myeloma   Staging method: AJCC 6th Edition   Clinical: Stage IIA signed by [name], MD on 11/07/2013  2:46 PM   Summary: Stage IIA        Multiple myeloma   10/31/2013 Bone Marrow Biopsy Bone marrow biopsy confirmed multiple myeloma with 40% bone marrow involvement. Skeletal survey showed minimal lesions in her score with generalized demineralization   11/10/2013 - 02/13/2014 Chemotherapy The patient is started on induction chemotherapy with weekly dexamethasone 40 mg by mouth as well as Velcade subcutaneous injection on days 1, 4, 8 and 11. On 11/21/2013, she was started on monthly Zometa.   12/23/2013 Adverse Reaction The dose of Velcade was reduced due to thrombocytopenia.   01/28/2014 - 04/07/2014 Chemotherapy Revlimid is added. Treatment was discontinued due to lack of response.   02/24/2014 - 04/07/2014 Chemotherapy Due to worsening peripheral neuropathy, Velcade injection is changed to once a week. Revlimid was given 21 days on, 7 days off.   04/07/2014 - 04/10/2014 Chemotherapy Revlimid was discontinued due to lack of response. Chemotherapy was changed back to Velcade injection twice a week, 2 weeks on 1 week off. Her treatment was switched to to minimum response   04/20/2014 -  Chemotherapy chemotherapy is switched to Carfilzomib, Cytoxan and dexamethasone.   04/22/2014 Procedure she has placement of port for chemotherapy.    INTERVAL HISTORY: Please see below for  problem oriented charting. She is seen prior to cycle 1 day 8 of treatment. She has port placed last week and had diarrhea for several days afterwards. She tolerated recent chemotherapy well without any side effects. She complained of some mild agitation with dexamethasone. The patient denies any recent signs or symptoms of bleeding such as spontaneous epistaxis, hematuria or hematochezia.  REVIEW OF SYSTEMS:   Constitutional: Denies fevers, chills or abnormal weight loss Eyes: Denies blurriness of vision Ears, nose, mouth, throat, and face: Denies mucositis or sore throat Respiratory: Denies cough, dyspnea or wheezes Cardiovascular: Denies palpitation, chest discomfort or lower extremity swelling Skin: Denies abnormal skin rashes Lymphatics: Denies new lymphadenopathy or easy bruising Neurological:Denies numbness, tingling or new weaknesses Behavioral/Psych: Mood is stable, no new changes  All other systems were reviewed with the patient and are negative.  I have reviewed the past medical history, past surgical history, social history and family history with the patient and they are unchanged from previous note.  ALLERGIES:  is allergic to penicillins and sulfa antibiotics.  MEDICATIONS:  Current Outpatient Prescriptions  Medication Sig Dispense Refill  . acyclovir (ZOVIRAX) 400 MG tablet Take 1 tablet (400 mg total) by mouth 2 (two) times daily. 180 tablet 3  . ALPRAZolam (XANAX) 0.5 MG tablet Take 0.5 mg by mouth 2 (two) times daily as needed for anxiety.    Marland Kitchen CARFILZOMIB IV Inject into the vein 2 (two) times a week.    . carvedilol (COREG) 6.25 MG tablet Take 3.125 mg by mouth 2 (two) times daily with a meal.    . cholecalciferol (VITAMIN D) 1000 UNITS tablet  Take 1,000 Units by mouth daily.    . Cyclophosphamide (CYTOXAN IJ) Inject as directed once a week.    Marland Kitchen dexamethasone (DECADRON) 4 MG tablet Take 20 mg by mouth once a week.    . docusate sodium (COLACE) 100 MG capsule Take  100 mg by mouth 2 (two) times daily.    Marland Kitchen lidocaine-prilocaine (EMLA) cream Apply 1 application topically as needed. 30 g 6  . loperamide (IMODIUM A-D) 2 MG tablet Take 2 mg by mouth 4 (four) times daily as needed for diarrhea or loose stools.    . mirtazapine (REMERON) 30 MG tablet Take 15 mg by mouth at bedtime.    . Multiple Vitamin (MULTIVITAMIN WITH MINERALS) TABS tablet Take 1 tablet by mouth daily.    . ondansetron (ZOFRAN) 8 MG tablet Take 8 mg by mouth every 8 (eight) hours as needed for nausea or vomiting.    . Zoledronic Acid (ZOMETA IV) Inject into the vein every 30 (thirty) days.     No current facility-administered medications for this visit.    PHYSICAL EXAMINATION: ECOG PERFORMANCE STATUS: 0 - Asymptomatic  Filed Vitals:   04/27/14 0915  BP: 142/71  Pulse: 76  Temp: 98.9 F (37.2 C)  Resp: 18   Filed Weights   04/27/14 0915  Weight: 147 lb 3.2 oz (66.769 kg)    GENERAL:alert, no distress and comfortable SKIN: skin color, texture, turgor are normal, no rashes or significant lesions. Port site looks okay EYES: normal, Conjunctiva are pink and non-injected, sclera clear OROPHARYNX:no exudate, no erythema and lips, buccal mucosa, and tongue normal  NECK: supple, thyroid normal size, non-tender, without nodularity LYMPH:  no palpable lymphadenopathy in the cervical, axillary or inguinal LUNGS: clear to auscultation and percussion with normal breathing effort HEART: regular rate & rhythm and no murmurs and no lower extremity edema ABDOMEN:abdomen soft, non-tender and normal bowel sounds Musculoskeletal:no cyanosis of digits and no clubbing  NEURO: alert & oriented x 3 with fluent speech, no focal motor/sensory deficits  LABORATORY DATA:  I have reviewed the data as listed    Component Value Date/Time   NA 140 04/27/2014 0850   NA 140 04/23/2014 1208   K 4.3 04/27/2014 0850   K 3.9 04/23/2014 1208   CL 103 04/23/2014 1208   CO2 25 04/27/2014 0850   CO2 26  04/23/2014 1208   GLUCOSE 90 04/27/2014 0850   GLUCOSE 81 04/23/2014 1208   BUN 9.5 04/27/2014 0850   BUN 16 04/23/2014 1208   CREATININE 0.8 04/27/2014 0850   CREATININE 0.69 04/23/2014 1208   CALCIUM 9.3 04/27/2014 0850   CALCIUM 8.4 04/23/2014 1208   PROT 7.2 04/27/2014 0850   PROT 8.5* 02/03/2014 0909   ALBUMIN 3.7 04/27/2014 0850   ALBUMIN 3.5 02/03/2014 0909   AST 11 04/27/2014 0850   AST 14 02/03/2014 0909   ALT 10 04/27/2014 0850   ALT 12 02/03/2014 0909   ALKPHOS 41 04/27/2014 0850   ALKPHOS 53 02/03/2014 0909   BILITOT 0.43 04/27/2014 0850   BILITOT <0.2* 02/03/2014 0909   GFRNONAA 88* 04/23/2014 1208   GFRAA >90 04/23/2014 1208    No results found for: SPEP, UPEP  Lab Results  Component Value Date   WBC 7.1 04/27/2014   NEUTROABS 3.0 04/27/2014   HGB 11.4* 04/27/2014   HCT 35.0 04/27/2014   MCV 97.0 04/27/2014   PLT 94* 04/27/2014      Chemistry      Component Value Date/Time   NA 140  04/27/2014 0850   NA 140 04/23/2014 1208   K 4.3 04/27/2014 0850   K 3.9 04/23/2014 1208   CL 103 04/23/2014 1208   CO2 25 04/27/2014 0850   CO2 26 04/23/2014 1208   BUN 9.5 04/27/2014 0850   BUN 16 04/23/2014 1208   CREATININE 0.8 04/27/2014 0850   CREATININE 0.69 04/23/2014 1208      Component Value Date/Time   CALCIUM 9.3 04/27/2014 0850   CALCIUM 8.4 04/23/2014 1208   ALKPHOS 41 04/27/2014 0850   ALKPHOS 53 02/03/2014 0909   AST 11 04/27/2014 0850   AST 14 02/03/2014 0909   ALT 10 04/27/2014 0850   ALT 12 02/03/2014 0909   BILITOT 0.43 04/27/2014 0850   BILITOT <0.2* 02/03/2014 0909     ASSESSMENT & PLAN:  Multiple myeloma So far, she tolerated treatment well. She is starting to have agitation with dexamethasone. I will initiate taper over the next few weeks. She will continue Zometa monthly. I will recheck serum light chains next week.  Anemia in neoplastic disease This is likely due to recent treatment. The patient denies recent history of  bleeding such as epistaxis, hematuria or hematochezia. She is asymptomatic from the anemia. I will observe for now.  She does not require transfusion now. I will continue the chemotherapy at current dose without dosage adjustment.  If the anemia gets progressive worse in the future, I might have to delay her treatment or adjust the chemotherapy dose.   Thrombocytopenia due to drugs This is likely due to recent treatment. The patient denies recent history of bleeding such as epistaxis, hematuria or hematochezia. She is asymptomatic from the low platelet count. I will observe for now.  she does not require transfusion now.     Diarrhea She continues to have mild intermittent diarrhea, resolve with Imodium as needed.   Orders Placed This Encounter  Procedures  . SPEP & IFE with QIG    Standing Status: Future     Number of Occurrences:      Standing Expiration Date: 06/01/2015  . Kappa/lambda light chains    Standing Status: Future     Number of Occurrences:      Standing Expiration Date: 06/01/2015  . Beta 2 microglobulin, serum    Standing Status: Future     Number of Occurrences:      Standing Expiration Date: 06/01/2015   All questions were answered. The patient knows to call the clinic with any problems, questions or concerns. No barriers to learning was detected. I spent 30 minutes counseling the patient face to face. The total time spent in the appointment was 40 minutes and more than 50% was on counseling and review of test results     [facility], [place], MD 04/27/2014 9:57 AM

## 2014-04-27 NOTE — Assessment & Plan Note (Signed)
So far, she tolerated treatment well. She is starting to have agitation with dexamethasone. I will initiate taper over the next few weeks. She will continue Zometa monthly. I will recheck serum light chains next week.

## 2014-04-27 NOTE — Assessment & Plan Note (Signed)
This is likely due to recent treatment. The patient denies recent history of bleeding such as epistaxis, hematuria or hematochezia. She is asymptomatic from the anemia. I will observe for now.  She does not require transfusion now. I will continue the chemotherapy at current dose without dosage adjustment.  If the anemia gets progressive worse in the future, I might have to delay her treatment or adjust the chemotherapy dose.  

## 2014-04-27 NOTE — Progress Notes (Signed)
Ok to proceed with chemotherapy treatment with platelet count of 94 per MD Alvy Bimler.

## 2014-04-28 ENCOUNTER — Ambulatory Visit: Payer: Medicare HMO | Admitting: Hematology and Oncology

## 2014-04-28 ENCOUNTER — Ambulatory Visit: Payer: Medicare HMO

## 2014-04-28 ENCOUNTER — Other Ambulatory Visit: Payer: Medicare HMO

## 2014-04-28 ENCOUNTER — Ambulatory Visit (INDEPENDENT_AMBULATORY_CARE_PROVIDER_SITE_OTHER): Payer: Medicare HMO | Admitting: Cardiovascular Disease

## 2014-04-28 ENCOUNTER — Encounter: Payer: Self-pay | Admitting: Cardiovascular Disease

## 2014-04-28 ENCOUNTER — Ambulatory Visit (HOSPITAL_BASED_OUTPATIENT_CLINIC_OR_DEPARTMENT_OTHER): Payer: Medicare HMO

## 2014-04-28 VITALS — BP 110/60 | HR 80 | Ht 63.0 in | Wt 150.5 lb

## 2014-04-28 DIAGNOSIS — T451X5A Adverse effect of antineoplastic and immunosuppressive drugs, initial encounter: Secondary | ICD-10-CM

## 2014-04-28 DIAGNOSIS — I10 Essential (primary) hypertension: Secondary | ICD-10-CM

## 2014-04-28 DIAGNOSIS — R0602 Shortness of breath: Secondary | ICD-10-CM

## 2014-04-28 DIAGNOSIS — C9000 Multiple myeloma not having achieved remission: Secondary | ICD-10-CM

## 2014-04-28 DIAGNOSIS — I6529 Occlusion and stenosis of unspecified carotid artery: Secondary | ICD-10-CM

## 2014-04-28 DIAGNOSIS — R002 Palpitations: Secondary | ICD-10-CM

## 2014-04-28 DIAGNOSIS — Z5112 Encounter for antineoplastic immunotherapy: Secondary | ICD-10-CM

## 2014-04-28 DIAGNOSIS — G620 Drug-induced polyneuropathy: Secondary | ICD-10-CM

## 2014-04-28 MED ORDER — DEXAMETHASONE SODIUM PHOSPHATE 10 MG/ML IJ SOLN
INTRAMUSCULAR | Status: AC
  Filled 2014-04-28: qty 1

## 2014-04-28 MED ORDER — HEPARIN SOD (PORK) LOCK FLUSH 100 UNIT/ML IV SOLN
500.0000 [IU] | Freq: Once | INTRAVENOUS | Status: AC | PRN
  Administered 2014-04-28: 500 [IU]
  Filled 2014-04-28: qty 5

## 2014-04-28 MED ORDER — SODIUM CHLORIDE 0.9 % IJ SOLN
10.0000 mL | INTRAMUSCULAR | Status: DC | PRN
  Administered 2014-04-28: 10 mL
  Filled 2014-04-28: qty 10

## 2014-04-28 MED ORDER — DEXAMETHASONE SODIUM PHOSPHATE 10 MG/ML IJ SOLN
10.0000 mg | Freq: Once | INTRAMUSCULAR | Status: AC
  Administered 2014-04-28: 10 mg via INTRAVENOUS

## 2014-04-28 MED ORDER — ONDANSETRON 8 MG/NS 50 ML IVPB
INTRAVENOUS | Status: AC
  Filled 2014-04-28: qty 8

## 2014-04-28 MED ORDER — SODIUM CHLORIDE 0.9 % IV SOLN
Freq: Once | INTRAVENOUS | Status: AC
  Administered 2014-04-28: 09:00:00 via INTRAVENOUS

## 2014-04-28 MED ORDER — DEXTROSE 5 % IV SOLN
35.0000 mg/m2 | Freq: Once | INTRAVENOUS | Status: AC
  Administered 2014-04-28: 60 mg via INTRAVENOUS
  Filled 2014-04-28: qty 30

## 2014-04-28 MED ORDER — ONDANSETRON 8 MG/50ML IVPB (CHCC)
8.0000 mg | Freq: Once | INTRAVENOUS | Status: AC
  Administered 2014-04-28: 8 mg via INTRAVENOUS

## 2014-04-28 NOTE — Assessment & Plan Note (Signed)
Due to premature beats. This is well-controlled with small dose carvedilol.

## 2014-04-28 NOTE — Progress Notes (Signed)
[id number] Notified Dr.Gorsuch of patient's fever of 100.4. Also spoke to her about patient's c/o jaw/ear pain after Zometa infusion. Pt also c/o headache at times throughout treatment. Per Dr.Gorsuch assessment: may proceed with treatment. Dr.Gorsuch instructed patient to make a dentist appointment so her jaw can be assessed. Pt verbalized understanding of MD's instructions.

## 2014-04-28 NOTE — Patient Instructions (Addendum)
[facility] Discharge Instructions for Patients Receiving Chemotherapy  Today you received the following chemotherapy agents Kyprolis  To help prevent nausea and vomiting after your treatment, we encourage you to take your nausea medication as prescribed   If you develop nausea and vomiting that is not controlled by your nausea medication, call the clinic.   BELOW ARE SYMPTOMS THAT SHOULD BE REPORTED IMMEDIATELY:  *FEVER GREATER THAN 100.5 F  *CHILLS WITH OR WITHOUT FEVER  NAUSEA AND VOMITING THAT IS NOT CONTROLLED WITH YOUR NAUSEA MEDICATION  *UNUSUAL SHORTNESS OF BREATH  *UNUSUAL BRUISING OR BLEEDING  TENDERNESS IN MOUTH AND THROAT WITH OR WITHOUT PRESENCE OF ULCERS  *URINARY PROBLEMS  *BOWEL PROBLEMS  UNUSUAL RASH Items with * indicate a potential emergency and should be followed up as soon as possible.  Feel free to call the clinic you have any questions or concerns. The clinic phone number is [phone]  Please follow up with your dentist as recommended by Jean Davidson for your jaw/ear pain. Thank-you!

## 2014-04-28 NOTE — Patient Instructions (Signed)
Continue same medications.   Your physician wants you to follow-up in: 6 months.  You will receive a reminder letter in the mail two months in advance. If you don't receive a letter, please call our office to schedule the follow-up appointment.  Your next appointment will be scheduled in our new office located at :  [facility]  [address], [address]  [place], [place] [address]  

## 2014-04-28 NOTE — Assessment & Plan Note (Signed)
Blood pressure is well controlled on carvedilol. If the chemotherapy for multiple myeloma is cardiotoxic, then serial evaluation of LV systolic function is indicated per protocol.

## 2014-04-28 NOTE — Progress Notes (Signed)
HPI  This is a 68 year old female who is here today for a followup visit regarding palpitations due to premature beats. She has family history of premature coronary artery disease.  She was seen in 2013 for palpitations and exertional dyspnea. A Holter monitor  showed one PVC as well as few PACs. An echocardiogram overall was normal. A treadmill stress test was normal. Due to hypertension and palpitations, she was started on carvedilol. No cardiac events since then. Unfortunately, she continues to deal with multiple myeloma with failure of treatment. She will be starting a new chemotherapy in the near future. She denies chest pain, shortness of breath or palpitations.   Allergies  Allergen Reactions  . Penicillins     UNKNOWN-CHILDHOOD ALLERGY  . Sulfa Antibiotics     UNKNOWN-CHILDHOOD ALLERGY     Current Outpatient Prescriptions on File Prior to Visit  Medication Sig Dispense Refill  . acyclovir (ZOVIRAX) 400 MG tablet Take 1 tablet (400 mg total) by mouth 2 (two) times daily. 180 tablet 3  . ALPRAZolam (XANAX) 0.5 MG tablet Take 0.5 mg by mouth 2 (two) times daily as needed for anxiety.    Marland Kitchen CARFILZOMIB IV Inject into the vein 2 (two) times a week.    . carvedilol (COREG) 6.25 MG tablet Take 3.125 mg by mouth 2 (two) times daily with a meal.    . cholecalciferol (VITAMIN D) 1000 UNITS tablet Take 1,000 Units by mouth daily.    . Cyclophosphamide (CYTOXAN IJ) Inject as directed once a week.    Marland Kitchen dexamethasone (DECADRON) 4 MG tablet Take 20 mg by mouth once a week.    . docusate sodium (COLACE) 100 MG capsule Take 100 mg by mouth 2 (two) times daily.    Marland Kitchen lidocaine-prilocaine (EMLA) cream Apply 1 application topically as needed. 30 g 6  . loperamide (IMODIUM A-D) 2 MG tablet Take 2 mg by mouth 4 (four) times daily as needed for diarrhea or loose stools.    . mirtazapine (REMERON) 30 MG tablet Take 15 mg by mouth at bedtime.    . Multiple Vitamin (MULTIVITAMIN WITH MINERALS) TABS  tablet Take 1 tablet by mouth daily.    . ondansetron (ZOFRAN) 8 MG tablet Take 8 mg by mouth every 8 (eight) hours as needed for nausea or vomiting.    . Zoledronic Acid (ZOMETA IV) Inject into the vein every 30 (thirty) days.     No current facility-administered medications on file prior to visit.     Past Medical History  Diagnosis Date  . Cervical disc disease     c7  . Spinal stenosis in cervical region   . Carpal tunnel syndrome   . Pinched nerve   . Varicose veins of lower extremities with inflammation   . Disc degeneration   . Hypertension     Borderline.  . Carotid stenosis     Mild bilateral  . Anemia   . Anxiety   . GERD (gastroesophageal reflux disease)   . Cancer      Past Surgical History  Procedure Laterality Date  . Colonoscopy    . Bladder tact  1988/1989    x2  . Shoulder surgery Right 04/06/13    right  . Port-a-cath removal       Family History  Problem Relation Age of Onset  . Heart disease Mother   . Heart failure Father   . Heart disease Father   . Hypertension Brother   . Heart disease Brother  congenital shunt, ?   . Colon cancer Paternal Aunt 71  . Throat cancer Father   . Skin cancer Father   . Skin cancer Brother   . Diabetes Father   . Kidney disease Father      History   Social History  . Marital Status: Divorced    Spouse Name: N/A    Number of Children: N/A  . Years of Education: N/A   Occupational History  . Not on file.   Social History Main Topics  . Smoking status: Former Smoker -- 0.50 packs/day for 4 years    Quit date: 06/26/1968  . Smokeless tobacco: Never Used  . Alcohol Use: 4.8 oz/week    8 Cans of beer per week     Comment: minimal  . Drug Use: No  . Sexual Activity: Not on file   Other Topics Concern  . Not on file   Social History Narrative   Chauncey Reading is daughter jailee, jaquez will,  full code ( reviewed 58)       PHYSICAL EXAM   BP 110/60 mmHg  Pulse 80  Ht _0  (1.6  m)  Wt 150 lb 8 oz (68.266 kg)  BMI 26.67 kg/m2 Constitutional: She is oriented to person, place, and time. She appears well-developed and well-nourished. No distress.  HENT: No nasal discharge.  Head: Normocephalic and atraumatic.  Eyes: Pupils are equal and round. Right eye exhibits no discharge. Left eye exhibits no discharge.  Neck: Normal range of motion. Neck supple. No JVD present. No thyromegaly present.  Cardiovascular: Normal rate, regular rhythm, normal heart sounds. Exam reveals no gallop and no friction rub. No murmur heard.  Pulmonary/Chest: Effort normal and breath sounds normal. No stridor. No respiratory distress. She has no wheezes. She has no rales. She exhibits no tenderness.  Abdominal: Soft. Bowel sounds are normal. She exhibits no distension. There is no tenderness. There is no rebound and no guarding.  Musculoskeletal: Normal range of motion. She exhibits no edema and no tenderness.  Neurological: She is alert and oriented to person, place, and time. Coordination normal.  Skin: Skin is warm and dry. No rash noted. She is not diaphoretic. No erythema. No pallor.  Psychiatric: She has a normal mood and affect. Her behavior is normal. Judgment and thought content normal.    [id number]  Rhythm  WITHIN NORMAL LIMITS  ASSESSMENT AND PLAN

## 2014-05-01 ENCOUNTER — Ambulatory Visit (HOSPITAL_BASED_OUTPATIENT_CLINIC_OR_DEPARTMENT_OTHER): Payer: Medicare HMO

## 2014-05-01 ENCOUNTER — Telehealth: Payer: Self-pay | Admitting: *Deleted

## 2014-05-01 ENCOUNTER — Ambulatory Visit: Payer: Medicare HMO

## 2014-05-01 VITALS — BP 146/70 | HR 77 | Temp 98.0°F | Resp 18

## 2014-05-01 DIAGNOSIS — C9000 Multiple myeloma not having achieved remission: Secondary | ICD-10-CM

## 2014-05-01 MED ORDER — SODIUM CHLORIDE 0.9 % IJ SOLN
10.0000 mL | INTRAMUSCULAR | Status: DC | PRN
  Administered 2014-05-01: 10 mL via INTRAVENOUS
  Filled 2014-05-01: qty 10

## 2014-05-01 MED ORDER — HEPARIN SOD (PORK) LOCK FLUSH 100 UNIT/ML IV SOLN
500.0000 [IU] | Freq: Once | INTRAVENOUS | Status: AC
  Administered 2014-05-01: 500 [IU] via INTRAVENOUS
  Filled 2014-05-01: qty 5

## 2014-05-01 NOTE — Progress Notes (Signed)
Patient states she could see tubing over her port last night and was afraid something was wrong. No tenderness, swelling or redness noted at port or above the port. Port flushes and aspirates blood easily with no pain, swelling, redness, or tenderness, patient reassured it was working properly. Patient instructed and verbalized understanding she is to call our office with any future concerns.

## 2014-05-01 NOTE — Telephone Encounter (Signed)
Pt left VM this morning reporting she felt her PAC move last night and it was "shocking" to her as it did not feel the same.  It feels normal again this morning.  She denied any pain, redness or swelling.  Instructed pt to come in to have PAC assessed by Flush nurse.  She will try to be here by 2 pm.

## 2014-05-04 ENCOUNTER — Other Ambulatory Visit (HOSPITAL_BASED_OUTPATIENT_CLINIC_OR_DEPARTMENT_OTHER): Payer: Medicare HMO

## 2014-05-04 ENCOUNTER — Ambulatory Visit (HOSPITAL_BASED_OUTPATIENT_CLINIC_OR_DEPARTMENT_OTHER): Payer: Medicare HMO

## 2014-05-04 DIAGNOSIS — T451X5A Adverse effect of antineoplastic and immunosuppressive drugs, initial encounter: Secondary | ICD-10-CM

## 2014-05-04 DIAGNOSIS — C9000 Multiple myeloma not having achieved remission: Secondary | ICD-10-CM

## 2014-05-04 DIAGNOSIS — Z5111 Encounter for antineoplastic chemotherapy: Secondary | ICD-10-CM

## 2014-05-04 DIAGNOSIS — Z5112 Encounter for antineoplastic immunotherapy: Secondary | ICD-10-CM

## 2014-05-04 DIAGNOSIS — G620 Drug-induced polyneuropathy: Secondary | ICD-10-CM

## 2014-05-04 LAB — COMPREHENSIVE METABOLIC PANEL (CC13)
ALT: 8 U/L (ref 0–55)
AST: 10 U/L (ref 5–34)
Albumin: 3.7 g/dL (ref 3.5–5.0)
Alkaline Phosphatase: 39 U/L — ABNORMAL LOW (ref 40–150)
Anion Gap: 7 mEq/L (ref 3–11)
BUN: 10.7 mg/dL (ref 7.0–26.0)
CO2: 28 mEq/L (ref 22–29)
Calcium: 9.1 mg/dL (ref 8.4–10.4)
Chloride: 106 mEq/L (ref 98–109)
Creatinine: 0.7 mg/dL (ref 0.6–1.1)
Glucose: 97 mg/dl (ref 70–140)
Potassium: 4.4 mEq/L (ref 3.5–5.1)
Sodium: 141 mEq/L (ref 136–145)
Total Bilirubin: 0.43 mg/dL (ref 0.20–1.20)
Total Protein: 6.8 g/dL (ref 6.4–8.3)

## 2014-05-04 LAB — CBC WITH DIFFERENTIAL/PLATELET
BASO%: 0.4 % (ref 0.0–2.0)
Basophils Absolute: 0 10*3/uL (ref 0.0–0.1)
EOS%: 1.3 % (ref 0.0–7.0)
Eosinophils Absolute: 0.1 10*3/uL (ref 0.0–0.5)
HCT: 34.4 % — ABNORMAL LOW (ref 34.8–46.6)
HGB: 11.3 g/dL — ABNORMAL LOW (ref 11.6–15.9)
LYMPH%: 24.8 % (ref 14.0–49.7)
MCH: 31.6 pg (ref 25.1–34.0)
MCHC: 32.9 g/dL (ref 31.5–36.0)
MCV: 96 fL (ref 79.5–101.0)
MONO#: 1.3 10*3/uL — ABNORMAL HIGH (ref 0.1–0.9)
MONO%: 20.2 % — ABNORMAL HIGH (ref 0.0–14.0)
NEUT#: 3.3 10*3/uL (ref 1.5–6.5)
NEUT%: 53.3 % (ref 38.4–76.8)
Platelets: 72 10*3/uL — ABNORMAL LOW (ref 145–400)
RBC: 3.58 10*6/uL — ABNORMAL LOW (ref 3.70–5.45)
RDW: 15.3 % — ABNORMAL HIGH (ref 11.2–14.5)
WBC: 6.2 10*3/uL (ref 3.9–10.3)
lymph#: 1.5 10*3/uL (ref 0.9–3.3)

## 2014-05-04 MED ORDER — ONDANSETRON 8 MG/50ML IVPB (CHCC)
8.0000 mg | Freq: Once | INTRAVENOUS | Status: AC
  Administered 2014-05-04: 8 mg via INTRAVENOUS

## 2014-05-04 MED ORDER — ONDANSETRON 8 MG/NS 50 ML IVPB
INTRAVENOUS | Status: AC
  Filled 2014-05-04: qty 8

## 2014-05-04 MED ORDER — DEXTROSE 5 % IV SOLN
35.0000 mg/m2 | Freq: Once | INTRAVENOUS | Status: AC
  Administered 2014-05-04: 60 mg via INTRAVENOUS
  Filled 2014-05-04: qty 30

## 2014-05-04 MED ORDER — SODIUM CHLORIDE 0.9 % IV SOLN
Freq: Once | INTRAVENOUS | Status: AC
  Administered 2014-05-04: 10:00:00 via INTRAVENOUS

## 2014-05-04 MED ORDER — HEPARIN SOD (PORK) LOCK FLUSH 100 UNIT/ML IV SOLN
500.0000 [IU] | Freq: Once | INTRAVENOUS | Status: AC | PRN
  Administered 2014-05-04: 500 [IU]
  Filled 2014-05-04: qty 5

## 2014-05-04 MED ORDER — SODIUM CHLORIDE 0.9 % IJ SOLN
10.0000 mL | INTRAMUSCULAR | Status: DC | PRN
  Administered 2014-05-04: 10 mL
  Filled 2014-05-04: qty 10

## 2014-05-04 MED ORDER — SODIUM CHLORIDE 0.9 % IV SOLN
300.0000 mg/m2 | Freq: Once | INTRAVENOUS | Status: AC
  Administered 2014-05-04: 520 mg via INTRAVENOUS
  Filled 2014-05-04: qty 26

## 2014-05-04 MED ORDER — DEXAMETHASONE SODIUM PHOSPHATE 10 MG/ML IJ SOLN
INTRAMUSCULAR | Status: AC
Start: 2014-05-04 — End: 2014-05-04
  Filled 2014-05-04: qty 1

## 2014-05-04 MED ORDER — DEXAMETHASONE SODIUM PHOSPHATE 10 MG/ML IJ SOLN
10.0000 mg | Freq: Once | INTRAMUSCULAR | Status: AC
  Administered 2014-05-04: 10 mg via INTRAVENOUS

## 2014-05-04 NOTE — Progress Notes (Signed)
Ok to treat with platelet count of 72 per MD Alvy Bimler.

## 2014-05-04 NOTE — Patient Instructions (Signed)
[facility] Discharge Instructions for Patients Receiving Chemotherapy  Today you received the following chemotherapy agents Cytoxan/Kyprolis.   To help prevent nausea and vomiting after your treatment, we encourage you to take your nausea medication as directed.    If you develop nausea and vomiting that is not controlled by your nausea medication, call the clinic.   BELOW ARE SYMPTOMS THAT SHOULD BE REPORTED IMMEDIATELY:  *FEVER GREATER THAN 100.5 F  *CHILLS WITH OR WITHOUT FEVER  NAUSEA AND VOMITING THAT IS NOT CONTROLLED WITH YOUR NAUSEA MEDICATION  *UNUSUAL SHORTNESS OF BREATH  *UNUSUAL BRUISING OR BLEEDING  TENDERNESS IN MOUTH AND THROAT WITH OR WITHOUT PRESENCE OF ULCERS  *URINARY PROBLEMS  *BOWEL PROBLEMS  UNUSUAL RASH Items with * indicate a potential emergency and should be followed up as soon as possible.  Feel free to call the clinic you have any questions or concerns. The clinic phone number is [phone].    

## 2014-05-05 ENCOUNTER — Other Ambulatory Visit: Payer: Medicare HMO

## 2014-05-05 ENCOUNTER — Ambulatory Visit: Payer: Medicare HMO

## 2014-05-05 ENCOUNTER — Ambulatory Visit (HOSPITAL_BASED_OUTPATIENT_CLINIC_OR_DEPARTMENT_OTHER): Payer: Medicare HMO

## 2014-05-05 DIAGNOSIS — T451X5A Adverse effect of antineoplastic and immunosuppressive drugs, initial encounter: Secondary | ICD-10-CM

## 2014-05-05 DIAGNOSIS — C9000 Multiple myeloma not having achieved remission: Secondary | ICD-10-CM

## 2014-05-05 DIAGNOSIS — Z5112 Encounter for antineoplastic immunotherapy: Secondary | ICD-10-CM

## 2014-05-05 DIAGNOSIS — G620 Drug-induced polyneuropathy: Secondary | ICD-10-CM

## 2014-05-05 MED ORDER — SODIUM CHLORIDE 0.9 % IV SOLN
Freq: Once | INTRAVENOUS | Status: AC
  Administered 2014-05-05: 10:00:00 via INTRAVENOUS

## 2014-05-05 MED ORDER — ONDANSETRON 8 MG/50ML IVPB (CHCC)
8.0000 mg | Freq: Once | INTRAVENOUS | Status: AC
  Administered 2014-05-05: 8 mg via INTRAVENOUS

## 2014-05-05 MED ORDER — DEXAMETHASONE SODIUM PHOSPHATE 10 MG/ML IJ SOLN
INTRAMUSCULAR | Status: AC
  Filled 2014-05-05: qty 1

## 2014-05-05 MED ORDER — SODIUM CHLORIDE 0.9 % IV SOLN
Freq: Once | INTRAVENOUS | Status: AC
  Administered 2014-05-05: 09:00:00 via INTRAVENOUS

## 2014-05-05 MED ORDER — DEXTROSE 5 % IV SOLN
35.0000 mg/m2 | Freq: Once | INTRAVENOUS | Status: AC
  Administered 2014-05-05: 60 mg via INTRAVENOUS
  Filled 2014-05-05: qty 30

## 2014-05-05 MED ORDER — ONDANSETRON 8 MG/NS 50 ML IVPB
INTRAVENOUS | Status: AC
  Filled 2014-05-05: qty 8

## 2014-05-05 MED ORDER — SODIUM CHLORIDE 0.9 % IJ SOLN
10.0000 mL | INTRAMUSCULAR | Status: DC | PRN
  Administered 2014-05-05: 10 mL
  Filled 2014-05-05: qty 10

## 2014-05-05 MED ORDER — HEPARIN SOD (PORK) LOCK FLUSH 100 UNIT/ML IV SOLN
500.0000 [IU] | Freq: Once | INTRAVENOUS | Status: AC | PRN
Start: 2014-05-05 — End: 2014-05-05
  Administered 2014-05-05: 500 [IU]
  Filled 2014-05-05: qty 5

## 2014-05-05 MED ORDER — DEXAMETHASONE SODIUM PHOSPHATE 10 MG/ML IJ SOLN
10.0000 mg | Freq: Once | INTRAMUSCULAR | Status: AC
  Administered 2014-05-05: 10 mg via INTRAVENOUS

## 2014-05-05 NOTE — Patient Instructions (Signed)
[facility] Discharge Instructions for Patients Receiving Chemotherapy  Today you received the following chemotherapy agents Kyprolis To help prevent nausea and vomiting after your treatment, we encourage you to take your nausea medication as prescribed.  If you develop nausea and vomiting that is not controlled by your nausea medication, call the clinic.   BELOW ARE SYMPTOMS THAT SHOULD BE REPORTED IMMEDIATELY:  *FEVER GREATER THAN 100.5 F  *CHILLS WITH OR WITHOUT FEVER  NAUSEA AND VOMITING THAT IS NOT CONTROLLED WITH YOUR NAUSEA MEDICATION  *UNUSUAL SHORTNESS OF BREATH  *UNUSUAL BRUISING OR BLEEDING  TENDERNESS IN MOUTH AND THROAT WITH OR WITHOUT PRESENCE OF ULCERS  *URINARY PROBLEMS  *BOWEL PROBLEMS  UNUSUAL RASH Items with * indicate a potential emergency and should be followed up as soon as possible.  Feel free to call the clinic you have any questions or concerns. The clinic phone number is [phone].    

## 2014-05-08 ENCOUNTER — Ambulatory Visit: Payer: Medicare HMO

## 2014-05-08 ENCOUNTER — Telehealth: Payer: Self-pay | Admitting: *Deleted

## 2014-05-08 LAB — SPEP & IFE WITH QIG
Albumin ELP: 54.9 % — ABNORMAL LOW (ref 55.8–66.1)
Alpha-1-Globulin: 3.9 % (ref 2.9–4.9)
Alpha-2-Globulin: 7.9 % (ref 7.1–11.8)
Beta 2: 22.7 % — ABNORMAL HIGH (ref 3.2–6.5)
Beta Globulin: 6.6 % (ref 4.7–7.2)
Gamma Globulin: 4 % — ABNORMAL LOW (ref 11.1–18.8)
IgA: 1510 mg/dL — ABNORMAL HIGH (ref 69–380)
IgG (Immunoglobin G), Serum: 293 mg/dL — ABNORMAL LOW (ref 690–1700)
IgM, Serum: 9 mg/dL — ABNORMAL LOW (ref 52–322)
M-Spike, %: 1.19 g/dL
Total Protein, Serum Electrophoresis: 6.6 g/dL (ref 6.0–8.3)

## 2014-05-08 LAB — KAPPA/LAMBDA LIGHT CHAINS
Kappa free light chain: 0.21 mg/dL — ABNORMAL LOW (ref 0.33–1.94)
Kappa:Lambda Ratio: 0.01 — ABNORMAL LOW (ref 0.26–1.65)
Lambda Free Lght Chn: 16.9 mg/dL — ABNORMAL HIGH (ref 0.57–2.63)

## 2014-05-08 LAB — BETA 2 MICROGLOBULIN, SERUM: Beta-2 Microglobulin: 3.59 mg/L — ABNORMAL HIGH (ref <=2.51)

## 2014-05-08 NOTE — Telephone Encounter (Signed)
Pt is calling for results of her "Myeloma" labs.

## 2014-05-08 NOTE — Telephone Encounter (Signed)
OK to give her a copy

## 2014-05-11 ENCOUNTER — Ambulatory Visit: Payer: Medicare HMO | Admitting: Cardiovascular Disease

## 2014-05-11 ENCOUNTER — Other Ambulatory Visit: Payer: Medicare HMO

## 2014-05-13 ENCOUNTER — Telehealth: Payer: Self-pay | Admitting: *Deleted

## 2014-05-13 NOTE — Telephone Encounter (Signed)
Gave patient most recent lab results- ok with Dr Alvy Bimler to give results. Mailed copy to patient

## 2014-05-14 ENCOUNTER — Ambulatory Visit: Payer: Medicare HMO | Admitting: Cardiovascular Disease

## 2014-05-18 ENCOUNTER — Ambulatory Visit (HOSPITAL_BASED_OUTPATIENT_CLINIC_OR_DEPARTMENT_OTHER): Payer: Medicare HMO | Admitting: Hematology and Oncology

## 2014-05-18 ENCOUNTER — Ambulatory Visit: Payer: Medicare HMO

## 2014-05-18 ENCOUNTER — Ambulatory Visit (HOSPITAL_BASED_OUTPATIENT_CLINIC_OR_DEPARTMENT_OTHER): Payer: Medicare HMO

## 2014-05-18 ENCOUNTER — Telehealth: Payer: Self-pay | Admitting: Hematology and Oncology

## 2014-05-18 ENCOUNTER — Other Ambulatory Visit (HOSPITAL_BASED_OUTPATIENT_CLINIC_OR_DEPARTMENT_OTHER): Payer: Medicare HMO

## 2014-05-18 VITALS — BP 146/62 | HR 79 | Temp 98.9°F | Resp 19 | Ht 63.0 in | Wt 149.8 lb

## 2014-05-18 DIAGNOSIS — D6959 Other secondary thrombocytopenia: Secondary | ICD-10-CM

## 2014-05-18 DIAGNOSIS — T451X5A Adverse effect of antineoplastic and immunosuppressive drugs, initial encounter: Secondary | ICD-10-CM

## 2014-05-18 DIAGNOSIS — C9000 Multiple myeloma not having achieved remission: Secondary | ICD-10-CM

## 2014-05-18 DIAGNOSIS — R197 Diarrhea, unspecified: Secondary | ICD-10-CM

## 2014-05-18 DIAGNOSIS — Z5111 Encounter for antineoplastic chemotherapy: Secondary | ICD-10-CM

## 2014-05-18 DIAGNOSIS — D630 Anemia in neoplastic disease: Secondary | ICD-10-CM

## 2014-05-18 DIAGNOSIS — T50905A Adverse effect of unspecified drugs, medicaments and biological substances, initial encounter: Secondary | ICD-10-CM

## 2014-05-18 DIAGNOSIS — G620 Drug-induced polyneuropathy: Secondary | ICD-10-CM

## 2014-05-18 DIAGNOSIS — Z95828 Presence of other vascular implants and grafts: Secondary | ICD-10-CM

## 2014-05-18 DIAGNOSIS — D649 Anemia, unspecified: Secondary | ICD-10-CM

## 2014-05-18 DIAGNOSIS — Z5112 Encounter for antineoplastic immunotherapy: Secondary | ICD-10-CM

## 2014-05-18 LAB — COMPREHENSIVE METABOLIC PANEL (CC13)
ALT: 7 U/L (ref 0–55)
AST: 14 U/L (ref 5–34)
Albumin: 3.6 g/dL (ref 3.5–5.0)
Alkaline Phosphatase: 48 U/L (ref 40–150)
Anion Gap: 9 mEq/L (ref 3–11)
BUN: 11 mg/dL (ref 7.0–26.0)
CO2: 26 mEq/L (ref 22–29)
Calcium: 9 mg/dL (ref 8.4–10.4)
Chloride: 106 mEq/L (ref 98–109)
Creatinine: 0.7 mg/dL (ref 0.6–1.1)
Glucose: 99 mg/dl (ref 70–140)
Potassium: 4.2 mEq/L (ref 3.5–5.1)
Sodium: 141 mEq/L (ref 136–145)
Total Bilirubin: 0.63 mg/dL (ref 0.20–1.20)
Total Protein: 6.5 g/dL (ref 6.4–8.3)

## 2014-05-18 LAB — CBC WITH DIFFERENTIAL/PLATELET
BASO%: 0 % (ref 0.0–2.0)
Basophils Absolute: 0 10*3/uL (ref 0.0–0.1)
EOS%: 1 % (ref 0.0–7.0)
Eosinophils Absolute: 0 10*3/uL (ref 0.0–0.5)
HCT: 29.7 % — ABNORMAL LOW (ref 34.8–46.6)
HGB: 9.7 g/dL — ABNORMAL LOW (ref 11.6–15.9)
LYMPH%: 27.5 % (ref 14.0–49.7)
MCH: 32.2 pg (ref 25.1–34.0)
MCHC: 32.7 g/dL (ref 31.5–36.0)
MCV: 98.7 fL (ref 79.5–101.0)
MONO#: 1 10*3/uL — ABNORMAL HIGH (ref 0.1–0.9)
MONO%: 26.7 % — ABNORMAL HIGH (ref 0.0–14.0)
NEUT#: 1.7 10*3/uL (ref 1.5–6.5)
NEUT%: 44.8 % (ref 38.4–76.8)
Platelets: 89 10*3/uL — ABNORMAL LOW (ref 145–400)
RBC: 3.01 10*6/uL — ABNORMAL LOW (ref 3.70–5.45)
RDW: 17.2 % — ABNORMAL HIGH (ref 11.2–14.5)
WBC: 3.8 10*3/uL — ABNORMAL LOW (ref 3.9–10.3)
lymph#: 1.1 10*3/uL (ref 0.9–3.3)

## 2014-05-18 MED ORDER — CARFILZOMIB CHEMO INJECTION 60 MG: 35.0000 mg/m2 | Freq: Once | INTRAVENOUS | Status: DC

## 2014-05-18 MED ORDER — DEXTROSE 5 % IV SOLN
60.0000 mg | Freq: Once | INTRAVENOUS | Status: AC
  Administered 2014-05-18: 60 mg via INTRAVENOUS
  Filled 2014-05-18: qty 30

## 2014-05-18 MED ORDER — SODIUM CHLORIDE 0.9 % IJ SOLN
10.0000 mL | INTRAMUSCULAR | Status: DC | PRN
  Administered 2014-05-18: 10 mL
  Filled 2014-05-18: qty 10

## 2014-05-18 MED ORDER — HEPARIN SOD (PORK) LOCK FLUSH 100 UNIT/ML IV SOLN
500.0000 [IU] | Freq: Once | INTRAVENOUS | Status: AC | PRN
  Administered 2014-05-18: 500 [IU]
  Filled 2014-05-18: qty 5

## 2014-05-18 MED ORDER — ZOLEDRONIC ACID 4 MG/100ML IV SOLN
4.0000 mg | Freq: Once | INTRAVENOUS | Status: AC
  Administered 2014-05-18: 4 mg via INTRAVENOUS
  Filled 2014-05-18: qty 100

## 2014-05-18 MED ORDER — DEXAMETHASONE SODIUM PHOSPHATE 10 MG/ML IJ SOLN
10.0000 mg | Freq: Once | INTRAMUSCULAR | Status: AC
  Administered 2014-05-18: 10 mg via INTRAVENOUS

## 2014-05-18 MED ORDER — HEPARIN SOD (PORK) LOCK FLUSH 100 UNIT/ML IV SOLN
500.0000 [IU] | Freq: Once | INTRAVENOUS | Status: AC
  Administered 2014-05-18: 500 [IU] via INTRAVENOUS
  Filled 2014-05-18: qty 5

## 2014-05-18 MED ORDER — DEXAMETHASONE SODIUM PHOSPHATE 10 MG/ML IJ SOLN
INTRAMUSCULAR | Status: AC
  Filled 2014-05-18: qty 1

## 2014-05-18 MED ORDER — SODIUM CHLORIDE 0.9 % IJ SOLN
10.0000 mL | INTRAMUSCULAR | Status: DC | PRN
  Administered 2014-05-18: 10 mL via INTRAVENOUS
  Filled 2014-05-18: qty 10

## 2014-05-18 MED ORDER — ONDANSETRON 8 MG/NS 50 ML IVPB
INTRAVENOUS | Status: AC
  Filled 2014-05-18: qty 8

## 2014-05-18 MED ORDER — SODIUM CHLORIDE 0.9 % IV SOLN
Freq: Once | INTRAVENOUS | Status: AC
  Administered 2014-05-18: 10:00:00 via INTRAVENOUS

## 2014-05-18 MED ORDER — ONDANSETRON 8 MG/50ML IVPB (CHCC)
8.0000 mg | Freq: Once | INTRAVENOUS | Status: AC
  Administered 2014-05-18: 8 mg via INTRAVENOUS

## 2014-05-18 MED ORDER — SODIUM CHLORIDE 0.9 % IV SOLN
300.0000 mg/m2 | Freq: Once | INTRAVENOUS | Status: AC
  Administered 2014-05-18: 520 mg via INTRAVENOUS
  Filled 2014-05-18: qty 26

## 2014-05-18 NOTE — Patient Instructions (Signed)
[facility] Discharge Instructions for Patients Receiving Chemotherapy  Today you received the following chemotherapy agents cytoxan/kyprolis   To help prevent nausea and vomiting after your treatment, we encourage you to take your nausea medication as directed  If you develop nausea and vomiting that is not controlled by your nausea medication, call the clinic.   BELOW ARE SYMPTOMS THAT SHOULD BE REPORTED IMMEDIATELY:  *FEVER GREATER THAN 100.5 F  *CHILLS WITH OR WITHOUT FEVER  NAUSEA AND VOMITING THAT IS NOT CONTROLLED WITH YOUR NAUSEA MEDICATION  *UNUSUAL SHORTNESS OF BREATH  *UNUSUAL BRUISING OR BLEEDING  TENDERNESS IN MOUTH AND THROAT WITH OR WITHOUT PRESENCE OF ULCERS  *URINARY PROBLEMS  *BOWEL PROBLEMS  UNUSUAL RASH Items with * indicate a potential emergency and should be followed up as soon as possible.  Feel free to call the clinic you have any questions or concerns. The clinic phone number is [phone].  

## 2014-05-18 NOTE — Telephone Encounter (Signed)
gv and printed appt sched and avs for pt for NOV thru Jan 2016....sed added tx. °

## 2014-05-18 NOTE — Assessment & Plan Note (Signed)
This is stable. Continue close monitoring.   

## 2014-05-18 NOTE — Assessment & Plan Note (Signed)
She continues to have mild intermittent diarrhea, resolve with Imodium as needed.

## 2014-05-18 NOTE — Patient Instructions (Signed)

## 2014-05-18 NOTE — Progress Notes (Signed)
[place] OFFICE PROGRESS NOTE  Patient Care Team: Jinny Sanders, MD as PCP - General (Family Medicine) [name], MD as Referring Physician (Hematology and Oncology) [name], MD as Consulting Physician (Internal Medicine)  SUMMARY OF ONCOLOGIC HISTORY: Oncology History   Multiple myeloma, Ig A Lambda, M spike 3.54 grams, Calcium 9.2, Creatinine 0.8, Beta 2 microglobulin 4.52, IgA 4840 mg/dL, lambda light chain 75.4, albumin 3.6, hemoglobin 9.7, platelet 115    Primary site: Multiple Myeloma   Staging method: AJCC 6th Edition   Clinical: Stage IIA signed by [name], MD on 11/07/2013  2:46 PM   Summary: Stage IIA        Multiple myeloma   10/31/2013 Bone Marrow Biopsy Bone marrow biopsy confirmed multiple myeloma with 40% bone marrow involvement. Skeletal survey showed minimal lesions in her score with generalized demineralization   11/10/2013 - 02/13/2014 Chemotherapy The patient is started on induction chemotherapy with weekly dexamethasone 40 mg by mouth as well as Velcade subcutaneous injection on days 1, 4, 8 and 11. On 11/21/2013, she was started on monthly Zometa.   12/23/2013 Adverse Reaction The dose of Velcade was reduced due to thrombocytopenia.   01/28/2014 - 04/07/2014 Chemotherapy Revlimid is added. Treatment was discontinued due to lack of response.   02/24/2014 - 04/07/2014 Chemotherapy Due to worsening peripheral neuropathy, Velcade injection is changed to once a week. Revlimid was given 21 days on, 7 days off.   04/07/2014 - 04/10/2014 Chemotherapy Revlimid was discontinued due to lack of response. Chemotherapy was changed back to Velcade injection twice a week, 2 weeks on 1 week off. Her treatment was switched to to minimum response   04/20/2014 -  Chemotherapy chemotherapy is switched to Carfilzomib, Cytoxan and dexamethasone.   04/22/2014 Procedure she has placement of port for chemotherapy.    INTERVAL HISTORY: Please see below for  problem oriented charting. She is seen prior to cycle 2 of treatment. She tolerated treatment well. She has low-grade intermittent fever but no signs of infection. Denies mucositis, cough, or abnormal skin rashes. She continues to have mild diarrhea on and off.  REVIEW OF SYSTEMS:   Constitutional: Denies fevers, chills or abnormal weight loss Eyes: Denies blurriness of vision Ears, nose, mouth, throat, and face: Denies mucositis or sore throat Respiratory: Denies cough, dyspnea or wheezes Cardiovascular: Denies palpitation, chest discomfort or lower extremity swelling Skin: Denies abnormal skin rashes Lymphatics: Denies new lymphadenopathy or easy bruising Neurological:Denies numbness, tingling or new weaknesses Behavioral/Psych: Mood is stable, no new changes  All other systems were reviewed with the patient and are negative.  I have reviewed the past medical history, past surgical history, social history and family history with the patient and they are unchanged from previous note.  ALLERGIES:  is allergic to penicillins and sulfa antibiotics.  MEDICATIONS:  Current Outpatient Prescriptions  Medication Sig Dispense Refill  . acyclovir (ZOVIRAX) 400 MG tablet Take 1 tablet (400 mg total) by mouth 2 (two) times daily. 180 tablet 3  . ALPRAZolam (XANAX) 0.5 MG tablet Take 0.5 mg by mouth 2 (two) times daily as needed for anxiety.    Marland Kitchen CARFILZOMIB IV Inject into the vein 2 (two) times a week.    . carvedilol (COREG) 6.25 MG tablet Take 3.125 mg by mouth 2 (two) times daily with a meal.    . cholecalciferol (VITAMIN D) 1000 UNITS tablet Take 1,000 Units by mouth daily.    . Cyclophosphamide (CYTOXAN IJ) Inject as directed once a week.    Marland Kitchen  dexamethasone (DECADRON) 4 MG tablet Take 4 mg by mouth once a week.     . docusate sodium (COLACE) 100 MG capsule Take 100 mg by mouth 2 (two) times daily.    Marland Kitchen lidocaine-prilocaine (EMLA) cream Apply 1 application topically as needed. 30 g 6  .  loperamide (IMODIUM A-D) 2 MG tablet Take 2 mg by mouth 4 (four) times daily as needed for diarrhea or loose stools.    . mirtazapine (REMERON) 30 MG tablet Take 15 mg by mouth at bedtime.    . Multiple Vitamin (MULTIVITAMIN WITH MINERALS) TABS tablet Take 1 tablet by mouth daily.    . ondansetron (ZOFRAN) 8 MG tablet Take 8 mg by mouth every 8 (eight) hours as needed for nausea or vomiting.    . Zoledronic Acid (ZOMETA IV) Inject into the vein every 30 (thirty) days.     No current facility-administered medications for this visit.    PHYSICAL EXAMINATION: ECOG PERFORMANCE STATUS: 0 - Asymptomatic  Filed Vitals:   05/18/14 0909  BP: 146/62  Pulse: 79  Temp: 98.9 F (37.2 C)  Resp: 19   Filed Weights   05/18/14 0909  Weight: 149 lb 12.8 oz (67.949 kg)    GENERAL:alert, no distress and comfortable SKIN: skin color, texture, turgor are normal, no rashes or significant lesions EYES: normal, Conjunctiva are pink and non-injected, sclera clear OROPHARYNX:no exudate, no erythema and lips, buccal mucosa, and tongue normal  NECK: supple, thyroid normal size, non-tender, without nodularity LYMPH:  no palpable lymphadenopathy in the cervical, axillary or inguinal LUNGS: clear to auscultation and percussion with normal breathing effort HEART: regular rate & rhythm and no murmurs and no lower extremity edema ABDOMEN:abdomen soft, non-tender and normal bowel sounds Musculoskeletal:no cyanosis of digits and no clubbing  NEURO: alert & oriented x 3 with fluent speech, no focal motor/sensory deficits  LABORATORY DATA:  I have reviewed the data as listed    Component Value Date/Time   NA 141 05/04/2014 0859   NA 140 04/23/2014 1208   K 4.4 05/04/2014 0859   K 3.9 04/23/2014 1208   CL 103 04/23/2014 1208   CO2 28 05/04/2014 0859   CO2 26 04/23/2014 1208   GLUCOSE 97 05/04/2014 0859   GLUCOSE 81 04/23/2014 1208   BUN 10.7 05/04/2014 0859   BUN 16 04/23/2014 1208   CREATININE 0.7  05/04/2014 0859   CREATININE 0.69 04/23/2014 1208   CALCIUM 9.1 05/04/2014 0859   CALCIUM 8.4 04/23/2014 1208   PROT 6.8 05/04/2014 0859   PROT 8.5* 02/03/2014 0909   ALBUMIN 3.7 05/04/2014 0859   ALBUMIN 3.5 02/03/2014 0909   AST 10 05/04/2014 0859   AST 14 02/03/2014 0909   ALT 8 05/04/2014 0859   ALT 12 02/03/2014 0909   ALKPHOS 39* 05/04/2014 0859   ALKPHOS 53 02/03/2014 0909   BILITOT 0.43 05/04/2014 0859   BILITOT <0.2* 02/03/2014 0909   GFRNONAA 88* 04/23/2014 1208   GFRAA >90 04/23/2014 1208    No results found for: SPEP, UPEP  Lab Results  Component Value Date   WBC 3.8* 05/18/2014   NEUTROABS 1.7 05/18/2014   HGB 9.7* 05/18/2014   HCT 29.7* 05/18/2014   MCV 98.7 05/18/2014   PLT 89* 05/18/2014      Chemistry      Component Value Date/Time   NA 141 05/04/2014 0859   NA 140 04/23/2014 1208   K 4.4 05/04/2014 0859   K 3.9 04/23/2014 1208   CL 103 04/23/2014 1208  CO2 28 05/04/2014 0859   CO2 26 04/23/2014 1208   BUN 10.7 05/04/2014 0859   BUN 16 04/23/2014 1208   CREATININE 0.7 05/04/2014 0859   CREATININE 0.69 04/23/2014 1208      Component Value Date/Time   CALCIUM 9.1 05/04/2014 0859   CALCIUM 8.4 04/23/2014 1208   ALKPHOS 39* 05/04/2014 0859   ALKPHOS 53 02/03/2014 0909   AST 10 05/04/2014 0859   AST 14 02/03/2014 0909   ALT 8 05/04/2014 0859   ALT 12 02/03/2014 0909   BILITOT 0.43 05/04/2014 0859   BILITOT <0.2* 02/03/2014 0909      ASSESSMENT & PLAN:  Multiple myeloma So far, she tolerated treatment well. Recent repeat serum protein electrophoresis and free light chain showed excellent response to treatment. She will continue the same dose without dose adjustment. She has appointment to visit with transplant physician next month to discuss about plan for bone marrow transplant in the new future.   Anemia in neoplastic disease This is likely due to recent treatment. The patient denies recent history of bleeding such as epistaxis,  hematuria or hematochezia. She is asymptomatic from the anemia. I will observe for now.  She does not require transfusion now. I will continue the chemotherapy at current dose without dosage adjustment.  If the anemia gets progressive worse in the future, I might have to delay her treatment or adjust the chemotherapy dose.  Diarrhea She continues to have mild intermittent diarrhea, resolve with Imodium as needed.  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 Continue close monitoring.  Thrombocytopenia due to drugs This is likely due to recent treatment. The patient denies recent history of bleeding such as epistaxis, hematuria or hematochezia. She is asymptomatic from the low platelet count. I will observe for now.  she does not require transfusion now.     No orders of the defined types were placed in this encounter.   All questions were answered. The patient knows to call the clinic with any problems, questions or concerns. No barriers to learning was detected. I spent 30 minutes counseling the patient face to face. The total time spent in the appointment was 40 minutes and more than 50% was on counseling and review of test results     [facility], [place], MD 05/18/2014 9:32 AM

## 2014-05-18 NOTE — Assessment & Plan Note (Signed)
This is likely due to recent treatment. The patient denies recent history of bleeding such as epistaxis, hematuria or hematochezia. She is asymptomatic from the low platelet count. I will observe for now.  she does not require transfusion now.  

## 2014-05-18 NOTE — Assessment & Plan Note (Signed)
This is likely due to recent treatment. The patient denies recent history of bleeding such as epistaxis, hematuria or hematochezia. She is asymptomatic from the anemia. I will observe for now.  She does not require transfusion now. I will continue the chemotherapy at current dose without dosage adjustment.  If the anemia gets progressive worse in the future, I might have to delay her treatment or adjust the chemotherapy dose.  

## 2014-05-18 NOTE — Assessment & Plan Note (Signed)
So far, she tolerated treatment well. Recent repeat serum protein electrophoresis and free light chain showed excellent response to treatment. She will continue the same dose without dose adjustment. She has appointment to visit with transplant physician next month to discuss about plan for bone marrow transplant in the new future.

## 2014-05-18 NOTE — Progress Notes (Signed)
Thrombocytopenia addressed in office note.  Consistently low platelet count, continue treatment.

## 2014-05-19 ENCOUNTER — Ambulatory Visit (HOSPITAL_BASED_OUTPATIENT_CLINIC_OR_DEPARTMENT_OTHER): Payer: Medicare HMO

## 2014-05-19 DIAGNOSIS — G620 Drug-induced polyneuropathy: Secondary | ICD-10-CM

## 2014-05-19 DIAGNOSIS — T451X5A Adverse effect of antineoplastic and immunosuppressive drugs, initial encounter: Secondary | ICD-10-CM

## 2014-05-19 DIAGNOSIS — Z5112 Encounter for antineoplastic immunotherapy: Secondary | ICD-10-CM

## 2014-05-19 DIAGNOSIS — C9000 Multiple myeloma not having achieved remission: Secondary | ICD-10-CM

## 2014-05-19 MED ORDER — DEXAMETHASONE SODIUM PHOSPHATE 10 MG/ML IJ SOLN
10.0000 mg | Freq: Once | INTRAMUSCULAR | Status: AC
  Administered 2014-05-19: 10 mg via INTRAVENOUS

## 2014-05-19 MED ORDER — DEXAMETHASONE SODIUM PHOSPHATE 10 MG/ML IJ SOLN
INTRAMUSCULAR | Status: AC
  Filled 2014-05-19: qty 1

## 2014-05-19 MED ORDER — SODIUM CHLORIDE 0.9 % IV SOLN
Freq: Once | INTRAVENOUS | Status: AC
  Administered 2014-05-19: 09:00:00 via INTRAVENOUS

## 2014-05-19 MED ORDER — DEXTROSE 5 % IV SOLN
35.0000 mg/m2 | Freq: Once | INTRAVENOUS | Status: AC
  Administered 2014-05-19: 60 mg via INTRAVENOUS
  Filled 2014-05-19: qty 30

## 2014-05-19 MED ORDER — HEPARIN SOD (PORK) LOCK FLUSH 100 UNIT/ML IV SOLN
500.0000 [IU] | Freq: Once | INTRAVENOUS | Status: AC | PRN
  Administered 2014-05-19: 500 [IU]
  Filled 2014-05-19: qty 5

## 2014-05-19 MED ORDER — ONDANSETRON 8 MG/NS 50 ML IVPB
INTRAVENOUS | Status: AC
  Filled 2014-05-19: qty 8

## 2014-05-19 MED ORDER — SODIUM CHLORIDE 0.9 % IJ SOLN
10.0000 mL | INTRAMUSCULAR | Status: DC | PRN
  Administered 2014-05-19: 10 mL
  Filled 2014-05-19: qty 10

## 2014-05-19 MED ORDER — ONDANSETRON 8 MG/50ML IVPB (CHCC)
8.0000 mg | Freq: Once | INTRAVENOUS | Status: AC
  Administered 2014-05-19: 8 mg via INTRAVENOUS

## 2014-05-19 NOTE — Progress Notes (Signed)
Pt reports she had a temp of "101.something" this morning. Took Ibuprofen. Temp 99 when checked prior to infusion. Dr. Alvy Bimler made aware. Order received to proceed with treatment. Pt instructed to call the office for shaking chills or fever that does not respond to Ibuprofen. She voiced understanding.

## 2014-05-19 NOTE — Patient Instructions (Signed)
[facility] Discharge Instructions for Patients Receiving Chemotherapy  Today you received the following chemotherapy agents: Carfilzomib.  To help prevent nausea and vomiting after your treatment, we encourage you to take your nausea medication, Zofran. Take one every eight hours as needed.   If you develop nausea and vomiting that is not controlled by your nausea medication, call the clinic.   BELOW ARE SYMPTOMS THAT SHOULD BE REPORTED IMMEDIATELY:  *FEVER GREATER THAN 100.5 F  *CHILLS WITH OR WITHOUT FEVER  NAUSEA AND VOMITING THAT IS NOT CONTROLLED WITH YOUR NAUSEA MEDICATION  *UNUSUAL SHORTNESS OF BREATH  *UNUSUAL BRUISING OR BLEEDING  TENDERNESS IN MOUTH AND THROAT WITH OR WITHOUT PRESENCE OF ULCERS  *URINARY PROBLEMS  *BOWEL PROBLEMS  UNUSUAL RASH Items with * indicate a potential emergency and should be followed up as soon as possible.  Feel free to call the clinic should you have any questions or concerns. The clinic phone number is [phone].

## 2014-05-25 ENCOUNTER — Ambulatory Visit: Payer: Medicare HMO

## 2014-05-25 ENCOUNTER — Other Ambulatory Visit: Payer: Medicare HMO

## 2014-05-26 ENCOUNTER — Other Ambulatory Visit (HOSPITAL_BASED_OUTPATIENT_CLINIC_OR_DEPARTMENT_OTHER): Payer: Medicare HMO

## 2014-05-26 ENCOUNTER — Ambulatory Visit (HOSPITAL_BASED_OUTPATIENT_CLINIC_OR_DEPARTMENT_OTHER): Payer: Medicare HMO

## 2014-05-26 DIAGNOSIS — T451X5A Adverse effect of antineoplastic and immunosuppressive drugs, initial encounter: Secondary | ICD-10-CM

## 2014-05-26 DIAGNOSIS — Z5111 Encounter for antineoplastic chemotherapy: Secondary | ICD-10-CM

## 2014-05-26 DIAGNOSIS — Z5112 Encounter for antineoplastic immunotherapy: Secondary | ICD-10-CM

## 2014-05-26 DIAGNOSIS — C9000 Multiple myeloma not having achieved remission: Secondary | ICD-10-CM

## 2014-05-26 DIAGNOSIS — G620 Drug-induced polyneuropathy: Secondary | ICD-10-CM

## 2014-05-26 LAB — COMPREHENSIVE METABOLIC PANEL (CC13)
ALT: 7 U/L (ref 0–55)
AST: 11 U/L (ref 5–34)
Albumin: 3.6 g/dL (ref 3.5–5.0)
Alkaline Phosphatase: 38 U/L — ABNORMAL LOW (ref 40–150)
Anion Gap: 9 mEq/L (ref 3–11)
BUN: 12.1 mg/dL (ref 7.0–26.0)
CO2: 27 mEq/L (ref 22–29)
Calcium: 9 mg/dL (ref 8.4–10.4)
Chloride: 106 mEq/L (ref 98–109)
Creatinine: 0.7 mg/dL (ref 0.6–1.1)
Glucose: 84 mg/dl (ref 70–140)
Potassium: 4.3 mEq/L (ref 3.5–5.1)
Sodium: 142 mEq/L (ref 136–145)
Total Bilirubin: 0.46 mg/dL (ref 0.20–1.20)
Total Protein: 6.3 g/dL — ABNORMAL LOW (ref 6.4–8.3)

## 2014-05-26 LAB — CBC WITH DIFFERENTIAL/PLATELET
BASO%: 0.5 % (ref 0.0–2.0)
Basophils Absolute: 0 10*3/uL (ref 0.0–0.1)
EOS%: 0.9 % (ref 0.0–7.0)
Eosinophils Absolute: 0 10*3/uL (ref 0.0–0.5)
HCT: 30 % — ABNORMAL LOW (ref 34.8–46.6)
HGB: 9.9 g/dL — ABNORMAL LOW (ref 11.6–15.9)
LYMPH%: 26.2 % (ref 14.0–49.7)
MCH: 32.8 pg (ref 25.1–34.0)
MCHC: 33.1 g/dL (ref 31.5–36.0)
MCV: 98.9 fL (ref 79.5–101.0)
MONO#: 1.2 10*3/uL — ABNORMAL HIGH (ref 0.1–0.9)
MONO%: 27.7 % — ABNORMAL HIGH (ref 0.0–14.0)
NEUT#: 1.9 10*3/uL (ref 1.5–6.5)
NEUT%: 44.7 % (ref 38.4–76.8)
Platelets: 132 10*3/uL — ABNORMAL LOW (ref 145–400)
RBC: 3.03 10*6/uL — ABNORMAL LOW (ref 3.70–5.45)
RDW: 18 % — ABNORMAL HIGH (ref 11.2–14.5)
WBC: 4.2 10*3/uL (ref 3.9–10.3)
lymph#: 1.1 10*3/uL (ref 0.9–3.3)

## 2014-05-26 MED ORDER — SODIUM CHLORIDE 0.9 % IJ SOLN
10.0000 mL | INTRAMUSCULAR | Status: DC | PRN
  Administered 2014-05-26: 10 mL
  Filled 2014-05-26: qty 10

## 2014-05-26 MED ORDER — CYCLOPHOSPHAMIDE CHEMO INJECTION 1 GM
300.0000 mg/m2 | Freq: Once | INTRAMUSCULAR | Status: AC
  Administered 2014-05-26: 520 mg via INTRAVENOUS
  Filled 2014-05-26: qty 26

## 2014-05-26 MED ORDER — HEPARIN SOD (PORK) LOCK FLUSH 100 UNIT/ML IV SOLN
500.0000 [IU] | Freq: Once | INTRAVENOUS | Status: AC | PRN
  Administered 2014-05-26: 500 [IU]
  Filled 2014-05-26: qty 5

## 2014-05-26 MED ORDER — ONDANSETRON 8 MG/50ML IVPB (CHCC)
8.0000 mg | Freq: Once | INTRAVENOUS | Status: AC
  Administered 2014-05-26: 8 mg via INTRAVENOUS

## 2014-05-26 MED ORDER — SODIUM CHLORIDE 0.9 % IV SOLN
Freq: Once | INTRAVENOUS | Status: AC
  Administered 2014-05-26: 12:00:00 via INTRAVENOUS

## 2014-05-26 MED ORDER — DEXAMETHASONE SODIUM PHOSPHATE 10 MG/ML IJ SOLN
10.0000 mg | Freq: Once | INTRAMUSCULAR | Status: AC
  Administered 2014-05-26: 10 mg via INTRAVENOUS

## 2014-05-26 MED ORDER — DEXTROSE 5 % IV SOLN
35.0000 mg/m2 | Freq: Once | INTRAVENOUS | Status: AC
  Administered 2014-05-26: 60 mg via INTRAVENOUS
  Filled 2014-05-26: qty 30

## 2014-05-26 MED ORDER — ONDANSETRON 8 MG/NS 50 ML IVPB
INTRAVENOUS | Status: AC
  Filled 2014-05-26: qty 8

## 2014-05-26 MED ORDER — DEXAMETHASONE SODIUM PHOSPHATE 10 MG/ML IJ SOLN
INTRAMUSCULAR | Status: AC
  Filled 2014-05-26: qty 1

## 2014-05-26 MED ORDER — SODIUM CHLORIDE 0.9 % IV SOLN
Freq: Once | INTRAVENOUS | Status: AC
  Administered 2014-05-26: 10:00:00 via INTRAVENOUS

## 2014-05-26 NOTE — Patient Instructions (Signed)
[facility] Discharge Instructions for Patients Receiving Chemotherapy  Today you received the following chemotherapy agents Kyprolis and Cytoxan.  To help prevent nausea and vomiting after your treatment, we encourage you to take your nausea medication.   If you develop nausea and vomiting that is not controlled by your nausea medication, call the clinic.   BELOW ARE SYMPTOMS THAT SHOULD BE REPORTED IMMEDIATELY:  *FEVER GREATER THAN 100.5 F  *CHILLS WITH OR WITHOUT FEVER  NAUSEA AND VOMITING THAT IS NOT CONTROLLED WITH YOUR NAUSEA MEDICATION  *UNUSUAL SHORTNESS OF BREATH  *UNUSUAL BRUISING OR BLEEDING  TENDERNESS IN MOUTH AND THROAT WITH OR WITHOUT PRESENCE OF ULCERS  *URINARY PROBLEMS  *BOWEL PROBLEMS  UNUSUAL RASH Items with * indicate a potential emergency and should be followed up as soon as possible.  Feel free to call the clinic you have any questions or concerns. The clinic phone number is [phone].    

## 2014-05-27 ENCOUNTER — Other Ambulatory Visit: Payer: Medicare HMO

## 2014-05-27 ENCOUNTER — Ambulatory Visit (HOSPITAL_BASED_OUTPATIENT_CLINIC_OR_DEPARTMENT_OTHER): Payer: Medicare HMO

## 2014-05-27 DIAGNOSIS — G620 Drug-induced polyneuropathy: Secondary | ICD-10-CM

## 2014-05-27 DIAGNOSIS — T451X5A Adverse effect of antineoplastic and immunosuppressive drugs, initial encounter: Secondary | ICD-10-CM

## 2014-05-27 DIAGNOSIS — Z5112 Encounter for antineoplastic immunotherapy: Secondary | ICD-10-CM

## 2014-05-27 DIAGNOSIS — C9000 Multiple myeloma not having achieved remission: Secondary | ICD-10-CM

## 2014-05-27 MED ORDER — SODIUM CHLORIDE 0.9 % IV SOLN
Freq: Once | INTRAVENOUS | Status: AC
  Administered 2014-05-27: 09:00:00 via INTRAVENOUS

## 2014-05-27 MED ORDER — DEXAMETHASONE SODIUM PHOSPHATE 10 MG/ML IJ SOLN
10.0000 mg | Freq: Once | INTRAMUSCULAR | Status: AC
  Administered 2014-05-27: 10 mg via INTRAVENOUS

## 2014-05-27 MED ORDER — ONDANSETRON 8 MG/50ML IVPB (CHCC)
8.0000 mg | Freq: Once | INTRAVENOUS | Status: AC
  Administered 2014-05-27: 8 mg via INTRAVENOUS

## 2014-05-27 MED ORDER — DEXTROSE 5 % IV SOLN
35.0000 mg/m2 | Freq: Once | INTRAVENOUS | Status: AC
  Administered 2014-05-27: 60 mg via INTRAVENOUS
  Filled 2014-05-27: qty 30

## 2014-05-27 MED ORDER — ONDANSETRON 8 MG/NS 50 ML IVPB
INTRAVENOUS | Status: AC
  Filled 2014-05-27: qty 8

## 2014-05-27 MED ORDER — SODIUM CHLORIDE 0.9 % IJ SOLN
10.0000 mL | INTRAMUSCULAR | Status: DC | PRN
Start: 2014-05-27 — End: 2014-05-27
  Administered 2014-05-27: 10 mL
  Filled 2014-05-27: qty 10

## 2014-05-27 MED ORDER — SODIUM CHLORIDE 0.9 % IV SOLN
Freq: Once | INTRAVENOUS | Status: AC
  Administered 2014-05-27: 10:00:00 via INTRAVENOUS

## 2014-05-27 MED ORDER — DEXAMETHASONE SODIUM PHOSPHATE 10 MG/ML IJ SOLN
INTRAMUSCULAR | Status: AC
  Filled 2014-05-27: qty 1

## 2014-05-27 MED ORDER — HEPARIN SOD (PORK) LOCK FLUSH 100 UNIT/ML IV SOLN
500.0000 [IU] | Freq: Once | INTRAVENOUS | Status: AC | PRN
  Administered 2014-05-27: 500 [IU]
  Filled 2014-05-27: qty 5

## 2014-05-27 NOTE — Patient Instructions (Signed)
[facility] Discharge Instructions for Patients Receiving Chemotherapy  Today you received the following chemotherapy agents Kyprolis To help prevent nausea and vomiting after your treatment, we encourage you to take your nausea medication as prescribed.  If you develop nausea and vomiting that is not controlled by your nausea medication, call the clinic.   BELOW ARE SYMPTOMS THAT SHOULD BE REPORTED IMMEDIATELY:  *FEVER GREATER THAN 100.5 F  *CHILLS WITH OR WITHOUT FEVER  NAUSEA AND VOMITING THAT IS NOT CONTROLLED WITH YOUR NAUSEA MEDICATION  *UNUSUAL SHORTNESS OF BREATH  *UNUSUAL BRUISING OR BLEEDING  TENDERNESS IN MOUTH AND THROAT WITH OR WITHOUT PRESENCE OF ULCERS  *URINARY PROBLEMS  *BOWEL PROBLEMS  UNUSUAL RASH Items with * indicate a potential emergency and should be followed up as soon as possible.  Feel free to call the clinic you have any questions or concerns. The clinic phone number is [phone].    

## 2014-05-28 ENCOUNTER — Telehealth: Payer: Self-pay | Admitting: Hematology and Oncology

## 2014-05-28 NOTE — Telephone Encounter (Signed)
pt called to cx all flushes due to ins issues...done.

## 2014-05-29 ENCOUNTER — Other Ambulatory Visit: Payer: Self-pay | Admitting: Hematology and Oncology

## 2014-05-29 DIAGNOSIS — C9000 Multiple myeloma not having achieved remission: Secondary | ICD-10-CM

## 2014-06-01 ENCOUNTER — Telehealth: Payer: Self-pay | Admitting: *Deleted

## 2014-06-01 ENCOUNTER — Ambulatory Visit (HOSPITAL_BASED_OUTPATIENT_CLINIC_OR_DEPARTMENT_OTHER): Payer: Medicare HMO

## 2014-06-01 ENCOUNTER — Other Ambulatory Visit (HOSPITAL_BASED_OUTPATIENT_CLINIC_OR_DEPARTMENT_OTHER): Payer: Medicare HMO

## 2014-06-01 DIAGNOSIS — C9000 Multiple myeloma not having achieved remission: Secondary | ICD-10-CM

## 2014-06-01 DIAGNOSIS — T451X5A Adverse effect of antineoplastic and immunosuppressive drugs, initial encounter: Secondary | ICD-10-CM

## 2014-06-01 DIAGNOSIS — Z5112 Encounter for antineoplastic immunotherapy: Secondary | ICD-10-CM

## 2014-06-01 DIAGNOSIS — Z5111 Encounter for antineoplastic chemotherapy: Secondary | ICD-10-CM

## 2014-06-01 DIAGNOSIS — G620 Drug-induced polyneuropathy: Secondary | ICD-10-CM

## 2014-06-01 LAB — COMPREHENSIVE METABOLIC PANEL (CC13)
ALT: 11 U/L (ref 0–55)
AST: 10 U/L (ref 5–34)
Albumin: 3.7 g/dL (ref 3.5–5.0)
Alkaline Phosphatase: 42 U/L (ref 40–150)
Anion Gap: 8 mEq/L (ref 3–11)
BUN: 10.4 mg/dL (ref 7.0–26.0)
CO2: 28 mEq/L (ref 22–29)
Calcium: 9.2 mg/dL (ref 8.4–10.4)
Chloride: 106 mEq/L (ref 98–109)
Creatinine: 0.7 mg/dL (ref 0.6–1.1)
EGFR: 89 mL/min/{1.73_m2} — ABNORMAL LOW (ref >90)
Glucose: 76 mg/dl (ref 70–140)
Potassium: 4.1 mEq/L (ref 3.5–5.1)
Sodium: 142 mEq/L (ref 136–145)
Total Bilirubin: 0.4 mg/dL (ref 0.20–1.20)
Total Protein: 6.1 g/dL — ABNORMAL LOW (ref 6.4–8.3)

## 2014-06-01 LAB — CBC WITH DIFFERENTIAL/PLATELET
BASO%: 0.2 % (ref 0.0–2.0)
Basophils Absolute: 0 10*3/uL (ref 0.0–0.1)
EOS%: 0.6 % (ref 0.0–7.0)
Eosinophils Absolute: 0 10*3/uL (ref 0.0–0.5)
HCT: 30.6 % — ABNORMAL LOW (ref 34.8–46.6)
HGB: 10 g/dL — ABNORMAL LOW (ref 11.6–15.9)
LYMPH%: 22.7 % (ref 14.0–49.7)
MCH: 32.9 pg (ref 25.1–34.0)
MCHC: 32.8 g/dL (ref 31.5–36.0)
MCV: 100.3 fL (ref 79.5–101.0)
MONO#: 1 10*3/uL — ABNORMAL HIGH (ref 0.1–0.9)
MONO%: 24.3 % — ABNORMAL HIGH (ref 0.0–14.0)
NEUT#: 2.2 10*3/uL (ref 1.5–6.5)
NEUT%: 52.2 % (ref 38.4–76.8)
Platelets: 102 10*3/uL — ABNORMAL LOW (ref 145–400)
RBC: 3.05 10*6/uL — ABNORMAL LOW (ref 3.70–5.45)
RDW: 18 % — ABNORMAL HIGH (ref 11.2–14.5)
WBC: 4.3 10*3/uL (ref 3.9–10.3)
lymph#: 1 10*3/uL (ref 0.9–3.3)

## 2014-06-01 MED ORDER — SODIUM CHLORIDE 0.9 % IV SOLN
300.0000 mg/m2 | Freq: Once | INTRAVENOUS | Status: AC
  Administered 2014-06-01: 520 mg via INTRAVENOUS
  Filled 2014-06-01: qty 26

## 2014-06-01 MED ORDER — SODIUM CHLORIDE 0.9 % IV SOLN
Freq: Once | INTRAVENOUS | Status: AC
  Administered 2014-06-01: 10:00:00 via INTRAVENOUS

## 2014-06-01 MED ORDER — ONDANSETRON 8 MG/50ML IVPB (CHCC)
8.0000 mg | Freq: Once | INTRAVENOUS | Status: AC
  Administered 2014-06-01: 8 mg via INTRAVENOUS

## 2014-06-01 MED ORDER — DEXTROSE 5 % IV SOLN
35.0000 mg/m2 | Freq: Once | INTRAVENOUS | Status: AC
  Administered 2014-06-01: 60 mg via INTRAVENOUS
  Filled 2014-06-01: qty 30

## 2014-06-01 MED ORDER — DEXAMETHASONE SODIUM PHOSPHATE 10 MG/ML IJ SOLN
10.0000 mg | Freq: Once | INTRAMUSCULAR | Status: AC
  Administered 2014-06-01: 10 mg via INTRAVENOUS

## 2014-06-01 MED ORDER — DEXAMETHASONE SODIUM PHOSPHATE 10 MG/ML IJ SOLN
INTRAMUSCULAR | Status: AC
  Filled 2014-06-01: qty 1

## 2014-06-01 MED ORDER — HEPARIN SOD (PORK) LOCK FLUSH 100 UNIT/ML IV SOLN
500.0000 [IU] | Freq: Once | INTRAVENOUS | Status: AC | PRN
  Administered 2014-06-01: 500 [IU]
  Filled 2014-06-01: qty 5

## 2014-06-01 MED ORDER — SODIUM CHLORIDE 0.9 % IJ SOLN
10.0000 mL | INTRAMUSCULAR | Status: DC | PRN
  Administered 2014-06-01: 10 mL
  Filled 2014-06-01: qty 10

## 2014-06-01 MED ORDER — ONDANSETRON 8 MG/NS 50 ML IVPB
INTRAVENOUS | Status: AC
  Filled 2014-06-01: qty 8

## 2014-06-01 NOTE — Patient Instructions (Signed)
[facility] Discharge Instructions for Patients Receiving Chemotherapy  Today you received the following chemotherapy agents:  Cytoxan and Kyprolis  To help prevent nausea and vomiting after your treatment, we encourage you to take your nausea medication as ordered per MD. If you develop nausea and vomiting that is not controlled by your nausea medication, call the clinic.   BELOW ARE SYMPTOMS THAT SHOULD BE REPORTED IMMEDIATELY:  *FEVER GREATER THAN 100.5 F  *CHILLS WITH OR WITHOUT FEVER  NAUSEA AND VOMITING THAT IS NOT CONTROLLED WITH YOUR NAUSEA MEDICATION  *UNUSUAL SHORTNESS OF BREATH  *UNUSUAL BRUISING OR BLEEDING  TENDERNESS IN MOUTH AND THROAT WITH OR WITHOUT PRESENCE OF ULCERS  *URINARY PROBLEMS  *BOWEL PROBLEMS  UNUSUAL RASH Items with * indicate a potential emergency and should be followed up as soon as possible.  Feel free to call the clinic you have any questions or concerns. The clinic phone number is [phone].    

## 2014-06-01 NOTE — Telephone Encounter (Signed)
Pt left VM states [employer] cannot process her claims because they have not received needed information from [name].   She would also like results of her labs from today when available.

## 2014-06-02 ENCOUNTER — Ambulatory Visit (HOSPITAL_BASED_OUTPATIENT_CLINIC_OR_DEPARTMENT_OTHER): Payer: Medicare HMO

## 2014-06-02 ENCOUNTER — Telehealth: Payer: Self-pay | Admitting: *Deleted

## 2014-06-02 DIAGNOSIS — T451X5A Adverse effect of antineoplastic and immunosuppressive drugs, initial encounter: Secondary | ICD-10-CM

## 2014-06-02 DIAGNOSIS — G620 Drug-induced polyneuropathy: Secondary | ICD-10-CM

## 2014-06-02 DIAGNOSIS — C9000 Multiple myeloma not having achieved remission: Secondary | ICD-10-CM

## 2014-06-02 DIAGNOSIS — Z5112 Encounter for antineoplastic immunotherapy: Secondary | ICD-10-CM

## 2014-06-02 MED ORDER — SODIUM CHLORIDE 0.9 % IV SOLN
Freq: Once | INTRAVENOUS | Status: AC
  Administered 2014-06-02: 10:00:00 via INTRAVENOUS

## 2014-06-02 MED ORDER — HEPARIN SOD (PORK) LOCK FLUSH 100 UNIT/ML IV SOLN
500.0000 [IU] | Freq: Once | INTRAVENOUS | Status: AC | PRN
  Administered 2014-06-02: 500 [IU]
  Filled 2014-06-02: qty 5

## 2014-06-02 MED ORDER — DEXAMETHASONE SODIUM PHOSPHATE 10 MG/ML IJ SOLN
INTRAMUSCULAR | Status: AC
  Filled 2014-06-02: qty 1

## 2014-06-02 MED ORDER — SODIUM CHLORIDE 0.9 % IV SOLN
Freq: Once | INTRAVENOUS | Status: AC
  Administered 2014-06-02: 11:00:00 via INTRAVENOUS

## 2014-06-02 MED ORDER — DEXAMETHASONE SODIUM PHOSPHATE 10 MG/ML IJ SOLN
10.0000 mg | Freq: Once | INTRAMUSCULAR | Status: AC
  Administered 2014-06-02: 10 mg via INTRAVENOUS

## 2014-06-02 MED ORDER — DEXTROSE 5 % IV SOLN
35.0000 mg/m2 | Freq: Once | INTRAVENOUS | Status: AC
  Administered 2014-06-02: 60 mg via INTRAVENOUS
  Filled 2014-06-02: qty 30

## 2014-06-02 MED ORDER — ONDANSETRON 8 MG/NS 50 ML IVPB
INTRAVENOUS | Status: AC
  Filled 2014-06-02: qty 8

## 2014-06-02 MED ORDER — SODIUM CHLORIDE 0.9 % IJ SOLN
10.0000 mL | INTRAMUSCULAR | Status: DC | PRN
  Administered 2014-06-02: 10 mL
  Filled 2014-06-02: qty 10

## 2014-06-02 MED ORDER — ONDANSETRON 8 MG/50ML IVPB (CHCC)
8.0000 mg | Freq: Once | INTRAVENOUS | Status: AC
  Administered 2014-06-02: 8 mg via INTRAVENOUS

## 2014-06-02 NOTE — Patient Instructions (Signed)
[facility] Discharge Instructions for Patients Receiving Chemotherapy  Today you received the following chemotherapy agents kyprolis  To help prevent nausea and vomiting after your treatment, we encourage you to take your nausea medication starting about 4 pm if needed.   If you develop nausea and vomiting that is not controlled by your nausea medication, call the clinic.   BELOW ARE SYMPTOMS THAT SHOULD BE REPORTED IMMEDIATELY:  *FEVER GREATER THAN 100.5 F  *CHILLS WITH OR WITHOUT FEVER  NAUSEA AND VOMITING THAT IS NOT CONTROLLED WITH YOUR NAUSEA MEDICATION  *UNUSUAL SHORTNESS OF BREATH  *UNUSUAL BRUISING OR BLEEDING  TENDERNESS IN MOUTH AND THROAT WITH OR WITHOUT PRESENCE OF ULCERS  *URINARY PROBLEMS  *BOWEL PROBLEMS  UNUSUAL RASH Items with * indicate a potential emergency and should be followed up as soon as possible.  Feel free to call the clinic you have any questions or concerns. The clinic phone number is [phone].

## 2014-06-02 NOTE — Telephone Encounter (Signed)
Left a message with light chain results

## 2014-06-03 ENCOUNTER — Telehealth: Payer: Self-pay | Admitting: *Deleted

## 2014-06-03 LAB — SPEP & IFE WITH QIG
Albumin ELP: 59.9 % (ref 55.8–66.1)
Alpha-1-Globulin: 4.9 % (ref 2.9–4.9)
Alpha-2-Globulin: 8.8 % (ref 7.1–11.8)
Beta 2: 16.2 % — ABNORMAL HIGH (ref 3.2–6.5)
Beta Globulin: 6.4 % (ref 4.7–7.2)
Gamma Globulin: 3.8 % — ABNORMAL LOW (ref 11.1–18.8)
IgA: 950 mg/dL — ABNORMAL HIGH (ref 69–380)
IgG (Immunoglobin G), Serum: 322 mg/dL — ABNORMAL LOW (ref 690–1700)
IgM, Serum: 8 mg/dL — ABNORMAL LOW (ref 52–322)
M-Spike, %: 0.7 g/dL
Total Protein, Serum Electrophoresis: 5.9 g/dL — ABNORMAL LOW (ref 6.0–8.3)

## 2014-06-03 LAB — KAPPA/LAMBDA LIGHT CHAINS
Kappa free light chain: 0.03 mg/dL — ABNORMAL LOW (ref 0.33–1.94)
Kappa:Lambda Ratio: 0 — ABNORMAL LOW (ref 0.26–1.65)
Lambda Free Lght Chn: 12.8 mg/dL — ABNORMAL HIGH (ref 0.57–2.63)

## 2014-06-03 NOTE — Telephone Encounter (Signed)
Pt left VM states [facility] is telling her they have not received needed records from [name] to process her claims.  She says she has received at least six notices about this and has contacted [name] several times.  I forwarded this message to Carmelina Noun and Gaspar Bidding in managed care dept. To please review and update pt.

## 2014-06-05 ENCOUNTER — Telehealth: Payer: Self-pay | Admitting: *Deleted

## 2014-06-05 NOTE — Telephone Encounter (Signed)
Gave lab results to patient

## 2014-06-08 ENCOUNTER — Telehealth: Payer: Self-pay

## 2014-06-08 ENCOUNTER — Telehealth: Payer: Self-pay | Admitting: *Deleted

## 2014-06-08 NOTE — Telephone Encounter (Signed)
Office note from 05/18/14 and labs from 06/01/14 faxed to [facility] at [phone]

## 2014-06-08 NOTE — Telephone Encounter (Signed)
Pt left VM asking for copy of her Myeloma labs be left w/ Chaplain Alexis.   Pt will see Ubaldo Glassing tomorrow night for support group.  Labs placed in envelope and given to [place] to give to pt.  Labs were also faxed to [facility].  Informed pt of above.

## 2014-06-11 ENCOUNTER — Telehealth: Payer: Self-pay | Admitting: *Deleted

## 2014-06-11 NOTE — Telephone Encounter (Signed)
Received call and fax from Karalee Height, BMT Coordinator at [facility] Dr. Alvie Heidelberg.  She informs they plan to proceed w/ Stem Cell Transplant in January.  Stop all treatment/chemotherapy now and pt needs to have BMBx done as soon as possible.  Information, notes given to Dr. Alvy Bimler.  Canceled all appts for treatment/lab/MD here and scheduled BMBX at Short Stay for Dec. 29th.   Notified Debbie at [employer].  Pt is still at [facility] for her appts.. Asked her to inform pt of appts and to call Dr. Calton Dach nurse tomorrow to confirm BMBX.  Informed it is scheduled as Sedated but if pt prefers unsedated we will r/s to be done in our clinic.  Jackelyn Poling will give pt the information.   Notified Butch Penny in [employer] of BMBx on 12/29.

## 2014-06-15 ENCOUNTER — Ambulatory Visit: Payer: Medicare HMO | Admitting: Hematology and Oncology

## 2014-06-15 ENCOUNTER — Ambulatory Visit: Payer: Medicare HMO

## 2014-06-15 ENCOUNTER — Other Ambulatory Visit: Payer: Medicare HMO

## 2014-06-15 ENCOUNTER — Telehealth: Payer: Self-pay | Admitting: *Deleted

## 2014-06-15 NOTE — Telephone Encounter (Signed)
Pt requests appt for Hickman Catheter dressing change on 07/15/14.  She reports she will have Hickman placed and PAC removed at [facility] on 07/07/14 in preparation for Stem Cell Transplant.  Informed her I will send request to scheduler to schedule her w/ Flush Nurse on 1/20 for dressing change.  Reminded her of BMBx on 12/29 w/ instructions NPO after midnight,  Arrive at 7 am and have driver home.  She verbalized understanding.

## 2014-06-16 ENCOUNTER — Ambulatory Visit: Payer: Medicare HMO

## 2014-06-16 ENCOUNTER — Other Ambulatory Visit: Payer: Self-pay

## 2014-06-16 ENCOUNTER — Telehealth: Payer: Self-pay | Admitting: Hematology and Oncology

## 2014-06-16 DIAGNOSIS — C9000 Multiple myeloma not having achieved remission: Secondary | ICD-10-CM

## 2014-06-16 NOTE — Telephone Encounter (Signed)
s.w. pt and advised on Dec and Jan 2016 appt.Marland KitchenMarland KitchenMarland KitchenMarland Kitchenpt ok and aware

## 2014-06-21 ENCOUNTER — Other Ambulatory Visit: Payer: Self-pay | Admitting: Hematology and Oncology

## 2014-06-21 DIAGNOSIS — C9000 Multiple myeloma not having achieved remission: Secondary | ICD-10-CM

## 2014-06-22 ENCOUNTER — Ambulatory Visit: Payer: Medicare HMO

## 2014-06-22 ENCOUNTER — Other Ambulatory Visit: Payer: Medicare HMO

## 2014-06-23 ENCOUNTER — Ambulatory Visit (HOSPITAL_COMMUNITY)
Admission: RE | Admit: 2014-06-23 | Discharge: 2014-06-23 | Disposition: A | Discharge status: Home / Self Care | Payer: Medicare HMO | Source: Ambulatory Visit | Attending: Hematology and Oncology | Admitting: Hematology and Oncology

## 2014-06-23 ENCOUNTER — Telehealth: Payer: Self-pay | Admitting: Hematology and Oncology

## 2014-06-23 ENCOUNTER — Encounter (HOSPITAL_COMMUNITY): Payer: Self-pay

## 2014-06-23 ENCOUNTER — Ambulatory Visit: Payer: Medicare HMO

## 2014-06-23 VITALS — BP 129/62 | HR 77 | Temp 98.0°F | Resp 16 | Ht 62.0 in | Wt 147.0 lb

## 2014-06-23 DIAGNOSIS — D696 Thrombocytopenia, unspecified: Secondary | ICD-10-CM | POA: Insufficient documentation

## 2014-06-23 DIAGNOSIS — D539 Nutritional anemia, unspecified: Secondary | ICD-10-CM | POA: Diagnosis not present

## 2014-06-23 DIAGNOSIS — C9000 Multiple myeloma not having achieved remission: Secondary | ICD-10-CM | POA: Diagnosis present

## 2014-06-23 LAB — CBC WITH DIFFERENTIAL/PLATELET
Basophils Absolute: 0 10*3/uL (ref 0.0–0.1)
Basophils Relative: 0 % (ref 0–1)
Eosinophils Absolute: 0 10*3/uL (ref 0.0–0.7)
Eosinophils Relative: 1 % (ref 0–5)
HCT: 36 % (ref 36.0–46.0)
Hemoglobin: 11.7 g/dL — ABNORMAL LOW (ref 12.0–15.0)
Lymphocytes Relative: 23 % (ref 12–46)
Lymphs Abs: 1.1 10*3/uL (ref 0.7–4.0)
MCH: 33.2 pg (ref 26.0–34.0)
MCHC: 32.5 g/dL (ref 30.0–36.0)
MCV: 102.3 fL — ABNORMAL HIGH (ref 78.0–100.0)
Monocytes Absolute: 1.5 10*3/uL — ABNORMAL HIGH (ref 0.1–1.0)
Monocytes Relative: 31 % — ABNORMAL HIGH (ref 3–12)
Neutro Abs: 2.2 10*3/uL (ref 1.7–7.7)
Neutrophils Relative %: 45 % (ref 43–77)
Platelets: 135 10*3/uL — ABNORMAL LOW (ref 150–400)
RBC: 3.52 MIL/uL — ABNORMAL LOW (ref 3.87–5.11)
RDW: 14.4 % (ref 11.5–15.5)
WBC: 4.8 10*3/uL (ref 4.0–10.5)

## 2014-06-23 LAB — BONE MARROW EXAM

## 2014-06-23 MED ORDER — FENTANYL CITRATE 0.05 MG/ML IJ SOLN
100.0000 ug | Freq: Once | INTRAMUSCULAR | Status: DC
  Filled 2014-06-23: qty 2

## 2014-06-23 MED ORDER — FENTANYL CITRATE 0.05 MG/ML IJ SOLN
INTRAMUSCULAR | Status: AC | PRN
  Administered 2014-06-23: 12.5 ug via INTRAVENOUS
  Administered 2014-06-23: 50 ug via INTRAVENOUS

## 2014-06-23 MED ORDER — MIDAZOLAM HCL 10 MG/2ML IJ SOLN
10.0000 mg | Freq: Once | INTRAMUSCULAR | Status: DC
  Filled 2014-06-23: qty 2

## 2014-06-23 MED ORDER — SODIUM CHLORIDE 0.9 % IV SOLN
INTRAVENOUS | Status: DC
  Administered 2014-06-23: 07:00:00 via INTRAVENOUS

## 2014-06-23 MED ORDER — MIDAZOLAM HCL 2 MG/2ML IJ SOLN
INTRAMUSCULAR | Status: AC | PRN
  Administered 2014-06-23: 6 mg via INTRAVENOUS
  Administered 2014-06-23: 2 mg via INTRAVENOUS

## 2014-06-23 NOTE — Procedures (Signed)
Brief examination was performed. ENT: adequate airway clearance Heart: regular rate and rhythm.No Murmurs Lungs: clear to auscultation, no wheezes, normal respiratory effort  American Society of Anesthesiologists ASA scale 2  Mallampati Score of 2  Bone Marrow Biopsy and Aspiration Procedure Note   Informed consent was obtained and potential risks including bleeding, infection and pain were reviewed with the patient. I verified that the patient has been fasting since midnight.  The patient's name, date of birth, identification, consent and allergies were verified prior to the start of procedure and time out was performed.  A total of 8 mg of IV Versed and 62.5 mcg of IV fentanyl were given.  The right posterior iliac crest was chosen as the site of biopsy.  The skin was prepped with Betadine solution.   8 cc of 1% lidocaine was used to provide local anaesthesia.   10 cc of bone marrow aspirate was obtained followed by 1 inch biopsy.   The procedure was tolerated well and there were no complications.  The patient was stable at the end of the procedure.  Specimens sent for flow cytometry, cytogenetics and additional studies.

## 2014-06-23 NOTE — Sedation Documentation (Signed)
Patient is resting comfortably. 

## 2014-06-23 NOTE — Discharge Instructions (Signed)
Bone Marrow Aspiration, Bone Marrow Biopsy °Care After °Read the instructions outlined below and refer to this sheet in the next few weeks. These discharge instructions provide you with general information on caring for yourself after you leave the hospital. Your caregiver may also give you specific instructions. While your treatment has been planned according to the most current medical practices available, unavoidable complications occasionally occur. If you have any problems or questions after discharge, call your caregiver. °FINDING OUT THE RESULTS OF YOUR TEST °Not all test results are available during your visit. If your test results are not back during the visit, make an appointment with your caregiver to find out the results. Do not assume everything is normal if you have not heard from your caregiver or the medical facility. It is important for you to follow up on all of your test results.  °HOME CARE INSTRUCTIONS  °You have had sedation and may be sleepy or dizzy. Your thinking may not be as clear as usual. For the next 24 hours: °· Only take over-the-counter or prescription medicines for pain, discomfort, and or fever as directed by your caregiver. °· Do not drink alcohol. °· Do not smoke. °· Do not drive. °· Do not make important legal decisions. °· Do not operate heavy machinery. °· Do not care for small children by yourself. °· Keep your dressing clean and dry. You may replace dressing with a bandage after 24 hours. °· You may take a bath or shower after 24 hours. °· Use an ice pack for 20 minutes every 2 hours while awake for pain as needed. °SEEK MEDICAL CARE IF:  °· There is redness, swelling, or increasing pain at the biopsy site. °· There is pus coming from the biopsy site. °· There is drainage from a biopsy site lasting longer than one day. °· An unexplained oral temperature above 102° F (38.9° C) develops. °SEEK IMMEDIATE MEDICAL CARE IF:  °· You develop a rash. °· You have difficulty  breathing. °· You develop any reaction or side effects to medications given. °Document Released: 12/30/2004 Document Revised: 09/04/2011 Document Reviewed: 06/09/2008 °ExitCare® Patient Information ©2015 [employer]. This information is not intended to replace advice given to you by your health care provider. Make sure you discuss any questions you have with your health care provider. °Conscious Sedation, Adult, Care After °Refer to this sheet in the next few weeks. These instructions provide you with information on caring for yourself after your procedure. Your health care provider may also give you more specific instructions. Your treatment has been planned according to current medical practices, but problems sometimes occur. Call your health care provider if you have any problems or questions after your procedure. °WHAT TO EXPECT AFTER THE PROCEDURE  °After your procedure: °· You may feel sleepy, clumsy, and have poor balance for several hours. °· Vomiting may occur if you eat too soon after the procedure. °HOME CARE INSTRUCTIONS °· Do not participate in any activities where you could become injured for at least 24 hours. Do not: °¨ Drive. °¨ Swim. °¨ Ride a bicycle. °¨ Operate heavy machinery. °¨ Cook. °¨ Use power tools. °¨ Climb ladders. °¨ Work from a high place. °· Do not make important decisions or sign legal documents until you are improved. °· If you vomit, drink water, juice, or soup when you can drink without vomiting. Make sure you have little or no nausea before eating solid foods. °· Only take over-the-counter or prescription medicines for pain, discomfort, or fever   as directed by your health care provider.  Make sure you and your family fully understand everything about the medicines given to you, including what side effects may occur.  You should not drink alcohol, take sleeping pills, or take medicines that cause drowsiness for at least 24 hours.  If you smoke, do not smoke without  supervision.  If you are feeling better, you may resume normal activities 24 hours after you were sedated.  Keep all appointments with your health care provider. SEEK MEDICAL CARE IF:  Your skin is pale or bluish in color.  You continue to feel nauseous or vomit.  Your pain is getting worse and is not helped by medicine.  You have bleeding or swelling.  You are still sleepy or feeling clumsy after 24 hours. SEEK IMMEDIATE MEDICAL CARE IF:  You develop a rash.  You have difficulty breathing.  You develop any type of allergic problem.  You have a fever. MAKE SURE YOU:  Understand these instructions.  Will watch your condition.  Will get help right away if you are not doing well or get worse. Document Released: 04/02/2013 Document Reviewed: 04/02/2013 [facility] Patient Information 2015 [place], [place]. This information is not intended to replace advice given to you by your health care provider. Make sure you discuss any questions you have with your health care provider.

## 2014-06-23 NOTE — Telephone Encounter (Signed)
lvm for pt regarding to Jan appt

## 2014-06-29 ENCOUNTER — Other Ambulatory Visit: Payer: Medicare HMO

## 2014-06-29 ENCOUNTER — Ambulatory Visit: Payer: Medicare HMO

## 2014-06-30 ENCOUNTER — Ambulatory Visit: Payer: Medicare HMO

## 2014-07-01 LAB — CHROMOSOME ANALYSIS, BONE MARROW

## 2014-07-01 LAB — TISSUE HYBRIDIZATION (BONE MARROW)-NCBH

## 2014-07-02 ENCOUNTER — Other Ambulatory Visit: Payer: Self-pay | Admitting: Hematology and Oncology

## 2014-07-02 ENCOUNTER — Encounter: Payer: Self-pay | Admitting: Hematology and Oncology

## 2014-07-02 ENCOUNTER — Ambulatory Visit (HOSPITAL_BASED_OUTPATIENT_CLINIC_OR_DEPARTMENT_OTHER): Payer: Medicare HMO | Admitting: Hematology and Oncology

## 2014-07-02 VITALS — BP 144/51 | HR 78 | Temp 98.1°F | Resp 20 | Ht 62.0 in | Wt 150.3 lb

## 2014-07-02 DIAGNOSIS — C9000 Multiple myeloma not having achieved remission: Secondary | ICD-10-CM

## 2014-07-02 NOTE — Assessment & Plan Note (Signed)
I reviewed her most recent bone marrow biopsy report. She had excellent response to treatment. She is scheduled for stem cell harvest and bone marrow transplant at the end of this month. I have scheduled her a port flush/Hickman dressing change for the 20th per request from her bone marrow transplant coordinator. I will wait to hear back from the patient for supportive care/surveillance program in the future.

## 2014-07-02 NOTE — Progress Notes (Signed)
[facility] OFFICE PROGRESS NOTE  Patient Care Team: Jinny Sanders, MD as PCP - General (Family Medicine) [name], MD as Referring Physician (Hematology and Oncology) [name], MD as Consulting Physician (Internal Medicine)  SUMMARY OF ONCOLOGIC HISTORY: Oncology History   Multiple myeloma, Ig A Lambda, M spike 3.54 grams, Calcium 9.2, Creatinine 0.8, Beta 2 microglobulin 4.52, IgA 4840 mg/dL, lambda light chain 75.4, albumin 3.6, hemoglobin 9.7, platelet 115    Primary site: Multiple Myeloma   Staging method: AJCC 6th Edition   Clinical: Stage IIA signed by [name], MD on 11/07/2013  2:46 PM   Summary: Stage IIA        Multiple myeloma   10/31/2013 Bone Marrow Biopsy Bone marrow biopsy confirmed multiple myeloma with 40% bone marrow involvement. Skeletal survey showed minimal lesions in her score with generalized demineralization   11/10/2013 - 02/13/2014 Chemotherapy The patient is started on induction chemotherapy with weekly dexamethasone 40 mg by mouth as well as Velcade subcutaneous injection on days 1, 4, 8 and 11. On 11/21/2013, she was started on monthly Zometa.   12/23/2013 Adverse Reaction The dose of Velcade was reduced due to thrombocytopenia.   01/28/2014 - 04/07/2014 Chemotherapy Revlimid is added. Treatment was discontinued due to lack of response.   02/24/2014 - 04/07/2014 Chemotherapy Due to worsening peripheral neuropathy, Velcade injection is changed to once a week. Revlimid was given 21 days on, 7 days off.   04/07/2014 - 04/10/2014 Chemotherapy Revlimid was discontinued due to lack of response. Chemotherapy was changed back to Velcade injection twice a week, 2 weeks on 1 week off. Her treatment was switched to to minimum response   04/20/2014 - 06/02/2014 Chemotherapy chemotherapy is switched to Carfilzomib, Cytoxan and dexamethasone.   04/22/2014 Procedure she has placement of port for chemotherapy.   06/01/2014 Tumor Marker Bloodwork  show that she has greater than partial response   06/23/2014 Bone Marrow Biopsy Bone marrow biopsy show 5-10% residual plasma cells    INTERVAL HISTORY: Please see below for problem oriented charting.. She returns today to review bone marrow test results. She feels well.  REVIEW OF SYSTEMS:   Constitutional: Denies fevers, chills or abnormal weight loss Eyes: Denies blurriness of vision Ears, nose, mouth, throat, and face: Denies mucositis or sore throat Respiratory: Denies cough, dyspnea or wheezes Cardiovascular: Denies palpitation, chest discomfort or lower extremity swelling Gastrointestinal:  Denies nausea, heartburn or change in bowel habits Skin: Denies abnormal skin rashes Lymphatics: Denies new lymphadenopathy or easy bruising Neurological:Denies numbness, tingling or new weaknesses Behavioral/Psych: Mood is stable, no new changes  All other systems were reviewed with the patient and are negative.  I have reviewed the past medical history, past surgical history, social history and family history with the patient and they are unchanged from previous note.  ALLERGIES:  is allergic to penicillins and sulfa antibiotics.  MEDICATIONS:  Current Outpatient Prescriptions  Medication Sig Dispense Refill  . acyclovir (ZOVIRAX) 400 MG tablet Take 1 tablet (400 mg total) by mouth 2 (two) times daily. 180 tablet 3  . ALPRAZolam (XANAX) 0.5 MG tablet Take 0.5 mg by mouth 2 (two) times daily as needed for anxiety.    . carvedilol (COREG) 6.25 MG tablet Take 3.125 mg by mouth 2 (two) times daily with a meal.    . cholecalciferol (VITAMIN D) 1000 UNITS tablet Take 1,000 Units by mouth daily.    Marland Kitchen docusate sodium (COLACE) 100 MG capsule Take 100 mg by mouth 2 (two)  times daily.    Marland Kitchen lidocaine-prilocaine (EMLA) cream Apply 1 application topically as needed. 30 g 6  . loperamide (IMODIUM A-D) 2 MG tablet Take 2 mg by mouth 4 (four) times daily as needed for diarrhea or loose stools.    .  mirtazapine (REMERON) 30 MG tablet Take 15 mg by mouth at bedtime.    . Multiple Vitamin (MULTIVITAMIN WITH MINERALS) TABS tablet Take 1 tablet by mouth daily.    . ondansetron (ZOFRAN) 8 MG tablet Take 8 mg by mouth every 8 (eight) hours as needed for nausea or vomiting.     No current facility-administered medications for this visit.    PHYSICAL EXAMINATION: ECOG PERFORMANCE STATUS: 0 - Asymptomatic  Filed Vitals:   07/02/14 0846  BP: 144/51  Pulse: 78  Temp: 98.1 F (36.7 C)  Resp: 20   Filed Weights   07/02/14 0846  Weight: 150 lb 4.8 oz (68.176 kg)    GENERAL:alert, no distress and comfortable SKIN: skin color, texture, turgor are normal, no rashes or significant lesions EYES: normal, Conjunctiva are pink and non-injected, sclera clear Musculoskeletal:no cyanosis of digits and no clubbing  NEURO: alert & oriented x 3 with fluent speech, no focal motor/sensory deficits  LABORATORY DATA:  I have reviewed the data as listed    Component Value Date/Time   NA 142 06/01/2014 0858   NA 140 04/23/2014 1208   K 4.1 06/01/2014 0858   K 3.9 04/23/2014 1208   CL 103 04/23/2014 1208   CO2 28 06/01/2014 0858   CO2 26 04/23/2014 1208   GLUCOSE 76 06/01/2014 0858   GLUCOSE 81 04/23/2014 1208   BUN 10.4 06/01/2014 0858   BUN 16 04/23/2014 1208   CREATININE 0.7 06/01/2014 0858   CREATININE 0.69 04/23/2014 1208   CALCIUM 9.2 06/01/2014 0858   CALCIUM 8.4 04/23/2014 1208   PROT 6.1* 06/01/2014 0858   PROT 8.5* 02/03/2014 0909   ALBUMIN 3.7 06/01/2014 0858   ALBUMIN 3.5 02/03/2014 0909   AST 10 06/01/2014 0858   AST 14 02/03/2014 0909   ALT 11 06/01/2014 0858   ALT 12 02/03/2014 0909   ALKPHOS 42 06/01/2014 0858   ALKPHOS 53 02/03/2014 0909   BILITOT 0.40 06/01/2014 0858   BILITOT <0.2* 02/03/2014 0909   GFRNONAA 88* 04/23/2014 1208   GFRAA >90 04/23/2014 1208    No results found for: SPEP, UPEP  Lab Results  Component Value Date   WBC 4.8 06/23/2014   NEUTROABS  2.2 06/23/2014   HGB 11.7* 06/23/2014   HCT 36.0 06/23/2014   MCV 102.3* 06/23/2014   PLT 135* 06/23/2014      Chemistry      Component Value Date/Time   NA 142 06/01/2014 0858   NA 140 04/23/2014 1208   K 4.1 06/01/2014 0858   K 3.9 04/23/2014 1208   CL 103 04/23/2014 1208   CO2 28 06/01/2014 0858   CO2 26 04/23/2014 1208   BUN 10.4 06/01/2014 0858   BUN 16 04/23/2014 1208   CREATININE 0.7 06/01/2014 0858   CREATININE 0.69 04/23/2014 1208      Component Value Date/Time   CALCIUM 9.2 06/01/2014 0858   CALCIUM 8.4 04/23/2014 1208   ALKPHOS 42 06/01/2014 0858   ALKPHOS 53 02/03/2014 0909   AST 10 06/01/2014 0858   AST 14 02/03/2014 0909   ALT 11 06/01/2014 0858   ALT 12 02/03/2014 0909   BILITOT 0.40 06/01/2014 0858   BILITOT <0.2* 02/03/2014 6812  ASSESSMENT & PLAN:  Multiple myeloma I reviewed her most recent bone marrow biopsy report. She had excellent response to treatment. She is scheduled for stem cell harvest and bone marrow transplant at the end of this month. I have scheduled her a port flush/Hickman dressing change for the 20th per request from her bone marrow transplant coordinator. I will wait to hear back from the patient for supportive care/surveillance program in the future.   No orders of the defined types were placed in this encounter.   All questions were answered. The patient knows to call the clinic with any problems, questions or concerns. No barriers to learning was detected. I spent 15 minutes counseling the patient face to face. The total time spent in the appointment was 20 minutes and more than 50% was on counseling and review of test results     [facility], Fraidy Mccarrick, MD 07/02/2014 9:18 AM

## 2014-07-03 ENCOUNTER — Encounter: Payer: Self-pay | Admitting: *Deleted

## 2014-07-03 NOTE — Progress Notes (Signed)
Faxed BMBx report and cytogenetics to Dr. Alvie Heidelberg at [facility] fax [phone]  [phone])

## 2014-07-14 ENCOUNTER — Ambulatory Visit (HOSPITAL_BASED_OUTPATIENT_CLINIC_OR_DEPARTMENT_OTHER): Payer: Medicare HMO

## 2014-07-14 VITALS — BP 124/67 | HR 74 | Temp 98.5°F | Resp 16

## 2014-07-14 DIAGNOSIS — C9000 Multiple myeloma not having achieved remission: Secondary | ICD-10-CM

## 2014-07-14 DIAGNOSIS — Z452 Encounter for adjustment and management of vascular access device: Secondary | ICD-10-CM

## 2014-07-14 DIAGNOSIS — Z95828 Presence of other vascular implants and grafts: Secondary | ICD-10-CM

## 2014-07-14 MED ORDER — HEPARIN SOD (PORK) LOCK FLUSH 100 UNIT/ML IV SOLN
500.0000 [IU] | Freq: Once | INTRAVENOUS | Status: AC
  Administered 2014-07-14: 250 [IU] via INTRAVENOUS
  Filled 2014-07-14: qty 5

## 2014-07-14 MED ORDER — SODIUM CHLORIDE 0.9 % IJ SOLN
10.0000 mL | INTRAMUSCULAR | Status: DC | PRN
  Administered 2014-07-14: 10 mL via INTRAVENOUS
  Filled 2014-07-14: qty 10

## 2014-07-17 ENCOUNTER — Encounter (HOSPITAL_COMMUNITY): Payer: Self-pay

## 2014-07-21 DIAGNOSIS — Z006 Encounter for examination for normal comparison and control in clinical research program: Secondary | ICD-10-CM | POA: Insufficient documentation

## 2014-08-06 ENCOUNTER — Telehealth: Payer: Self-pay | Admitting: *Deleted

## 2014-08-06 NOTE — Telephone Encounter (Signed)
Call from Ander Purpura, NP. At [facility] ph [phone].   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   Pt is scheduled to be d/c'd home tomorrow  2/12.  They request lab and MD visit w/ Dr. Alvy Bimler on Thursday or Friday next week 2/18 or 2/19.   They will fax [name] notes and orders when pt is d/c'd tomorrow.   Informed Lauren that I could not give them a date and time until Monday as Dr. Alvy Bimler is out of the office and her schedule is quite full next week.   Will call pt w/ appt d/t next week.   She will let pt know.

## 2014-08-10 ENCOUNTER — Other Ambulatory Visit: Payer: Self-pay | Admitting: Hematology and Oncology

## 2014-08-10 ENCOUNTER — Telehealth: Payer: Self-pay | Admitting: Hematology and Oncology

## 2014-08-10 ENCOUNTER — Telehealth: Payer: Self-pay | Admitting: *Deleted

## 2014-08-10 ENCOUNTER — Other Ambulatory Visit: Payer: Self-pay | Admitting: *Deleted

## 2014-08-10 DIAGNOSIS — T451X5A Adverse effect of antineoplastic and immunosuppressive drugs, initial encounter: Secondary | ICD-10-CM | POA: Insufficient documentation

## 2014-08-10 DIAGNOSIS — D701 Agranulocytosis secondary to cancer chemotherapy: Secondary | ICD-10-CM

## 2014-08-10 DIAGNOSIS — C9000 Multiple myeloma not having achieved remission: Secondary | ICD-10-CM

## 2014-08-10 NOTE — Telephone Encounter (Signed)
Per staff message and POF I have scheduled appts. Advised scheduler of appts and no available for treament on 2/23   JMW

## 2014-08-10 NOTE — Telephone Encounter (Signed)
, °

## 2014-08-10 NOTE — Telephone Encounter (Signed)
Informed pt of her appts made for this Thursday 2/18 for lab/MD and possible transfusion.

## 2014-08-13 ENCOUNTER — Other Ambulatory Visit (HOSPITAL_BASED_OUTPATIENT_CLINIC_OR_DEPARTMENT_OTHER): Payer: Medicare HMO

## 2014-08-13 ENCOUNTER — Encounter: Payer: Self-pay | Admitting: *Deleted

## 2014-08-13 ENCOUNTER — Telehealth: Payer: Self-pay | Admitting: Hematology and Oncology

## 2014-08-13 ENCOUNTER — Encounter: Payer: Self-pay | Admitting: Hematology and Oncology

## 2014-08-13 ENCOUNTER — Ambulatory Visit (HOSPITAL_BASED_OUTPATIENT_CLINIC_OR_DEPARTMENT_OTHER): Payer: Medicare HMO | Admitting: Hematology and Oncology

## 2014-08-13 VITALS — BP 128/75 | HR 95 | Temp 98.7°F | Resp 18 | Ht 62.0 in | Wt 142.5 lb

## 2014-08-13 DIAGNOSIS — C9000 Multiple myeloma not having achieved remission: Secondary | ICD-10-CM

## 2014-08-13 DIAGNOSIS — Z9481 Bone marrow transplant status: Secondary | ICD-10-CM

## 2014-08-13 DIAGNOSIS — D6959 Other secondary thrombocytopenia: Secondary | ICD-10-CM

## 2014-08-13 DIAGNOSIS — T50905A Adverse effect of unspecified drugs, medicaments and biological substances, initial encounter: Secondary | ICD-10-CM

## 2014-08-13 LAB — CBC WITH DIFFERENTIAL/PLATELET
BASO%: 0.4 % (ref 0.0–2.0)
Basophils Absolute: 0.1 10*3/uL (ref 0.0–0.1)
EOS%: 0 % (ref 0.0–7.0)
Eosinophils Absolute: 0 10*3/uL (ref 0.0–0.5)
HCT: 42.7 % (ref 34.8–46.6)
HGB: 14.4 g/dL (ref 11.6–15.9)
LYMPH%: 14.7 % (ref 14.0–49.7)
MCH: 31.3 pg (ref 25.1–34.0)
MCHC: 33.7 g/dL (ref 31.5–36.0)
MCV: 92.8 fL (ref 79.5–101.0)
MONO#: 2.6 10*3/uL — ABNORMAL HIGH (ref 0.1–0.9)
MONO%: 21.6 % — ABNORMAL HIGH (ref 0.0–14.0)
NEUT#: 7.5 10*3/uL — ABNORMAL HIGH (ref 1.5–6.5)
NEUT%: 63.3 % (ref 38.4–76.8)
Platelets: 75 10*3/uL — ABNORMAL LOW (ref 145–400)
RBC: 4.6 10*6/uL (ref 3.70–5.45)
RDW: 14.4 % (ref 11.2–14.5)
WBC: 11.8 10*3/uL — ABNORMAL HIGH (ref 3.9–10.3)
lymph#: 1.7 10*3/uL (ref 0.9–3.3)

## 2014-08-13 LAB — COMPREHENSIVE METABOLIC PANEL (CC13)
ALT: 9 U/L (ref 0–55)
AST: 14 U/L (ref 5–34)
Albumin: 4 g/dL (ref 3.5–5.0)
Alkaline Phosphatase: 59 U/L (ref 40–150)
Anion Gap: 8 mEq/L (ref 3–11)
BUN: 8.8 mg/dL (ref 7.0–26.0)
CO2: 29 mEq/L (ref 22–29)
Calcium: 9.2 mg/dL (ref 8.4–10.4)
Chloride: 102 mEq/L (ref 98–109)
Creatinine: 0.7 mg/dL (ref 0.6–1.1)
EGFR: 87 mL/min/{1.73_m2} — ABNORMAL LOW (ref >90)
Glucose: 98 mg/dl (ref 70–140)
Potassium: 4.9 mEq/L (ref 3.5–5.1)
Sodium: 139 mEq/L (ref 136–145)
Total Bilirubin: 0.33 mg/dL (ref 0.20–1.20)
Total Protein: 6.3 g/dL — ABNORMAL LOW (ref 6.4–8.3)

## 2014-08-13 LAB — HOLD TUBE, BLOOD BANK

## 2014-08-13 LAB — TECHNOLOGIST REVIEW

## 2014-08-13 LAB — MAGNESIUM (CC13): Magnesium: 2.6 mg/dl — ABNORMAL HIGH (ref 1.5–2.5)

## 2014-08-13 NOTE — Progress Notes (Signed)
Lab results faxed to [facility] at fax [phone]

## 2014-08-13 NOTE — Telephone Encounter (Signed)
gv adn printed appt sched adn avs for pt for Feb °

## 2014-08-14 DIAGNOSIS — Z9481 Bone marrow transplant status: Secondary | ICD-10-CM | POA: Insufficient documentation

## 2014-08-14 NOTE — Assessment & Plan Note (Signed)
She is doing well posttransplant. She will receive posttransplant vaccination at [facility] next month.

## 2014-08-14 NOTE — Progress Notes (Signed)
[facility] OFFICE PROGRESS NOTE  Patient Care Team: Jinny Sanders, MD as PCP - General (Family Medicine) [name], MD as Referring Physician (Hematology and Oncology) [name], MD as Consulting Physician (Internal Medicine)  SUMMARY OF ONCOLOGIC HISTORY: Oncology History   Multiple myeloma, Ig A Lambda, M spike 3.54 grams, Calcium 9.2, Creatinine 0.8, Beta 2 microglobulin 4.52, IgA 4840 mg/dL, lambda light chain 75.4, albumin 3.6, hemoglobin 9.7, platelet 115    Primary site: Multiple Myeloma   Staging method: AJCC 6th Edition   Clinical: Stage IIA signed by [name], MD on 11/07/2013  2:46 PM   Summary: Stage IIA        Multiple myeloma   10/31/2013 Bone Marrow Biopsy Bone marrow biopsy confirmed multiple myeloma with 40% bone marrow involvement. Skeletal survey showed minimal lesions in her score with generalized demineralization   11/10/2013 - 02/13/2014 Chemotherapy The patient is started on induction chemotherapy with weekly dexamethasone 40 mg by mouth as well as Velcade subcutaneous injection on days 1, 4, 8 and 11. On 11/21/2013, she was started on monthly Zometa.   12/23/2013 Adverse Reaction The dose of Velcade was reduced due to thrombocytopenia.   01/28/2014 - 04/07/2014 Chemotherapy Revlimid is added. Treatment was discontinued due to lack of response.   02/24/2014 - 04/07/2014 Chemotherapy Due to worsening peripheral neuropathy, Velcade injection is changed to once a week. Revlimid was given 21 days on, 7 days off.   04/07/2014 - 04/10/2014 Chemotherapy Revlimid was discontinued due to lack of response. Chemotherapy was changed back to Velcade injection twice a week, 2 weeks on 1 week off. Her treatment was switched to to minimum response   04/20/2014 - 06/02/2014 Chemotherapy chemotherapy is switched to Carfilzomib, Cytoxan and dexamethasone.   04/22/2014 Procedure she has placement of port for chemotherapy.   06/01/2014 Tumor Marker Bloodwork  show that she has greater than partial response   06/23/2014 Bone Marrow Biopsy Bone marrow biopsy show 5-10% residual plasma cells, normal cytogenetics and FISH   07/07/2014 Procedure She had stem cell collection   07/22/2014 - 07/22/2014 Chemotherapy She had high-dose chemotherapy with melphalan   07/23/2014 Bone Marrow Transplant She had bone marrow transplant in autologous fashion at [facility]: Please see below for problem oriented charting. She returns for supportive care visit after recent bone marrow transplant. She complained of mild fatigue and mild nonproductive cough. Overall, she denies fevers, chills, or signs of infection.  REVIEW OF SYSTEMS:   Constitutional: Denies fevers, chills or abnormal weight loss Eyes: Denies blurriness of vision Ears, nose, mouth, throat, and face: Denies mucositis or sore throat Cardiovascular: Denies palpitation, chest discomfort or lower extremity swelling Gastrointestinal:  Denies nausea, heartburn or change in bowel habits Skin: Denies abnormal skin rashes Lymphatics: Denies new lymphadenopathy or easy bruising Neurological:Denies numbness, tingling or new weaknesses Behavioral/Psych: Mood is stable, no new changes  All other systems were reviewed with the patient and are negative.  I have reviewed the past medical history, past surgical history, social history and family history with the patient and they are unchanged from previous note.  ALLERGIES:  is allergic to penicillins and sulfa antibiotics.  MEDICATIONS:  Current Outpatient Prescriptions  Medication Sig Dispense Refill  . acyclovir (ZOVIRAX) 400 MG tablet Take 1 tablet (400 mg total) by mouth 2 (two) times daily. 180 tablet 3  . ALPRAZolam (XANAX) 0.5 MG tablet Take 0.5 mg by mouth 2 (two) times daily as needed for anxiety.    Marland Kitchen  calcium carbonate (TUMS EX) 750 MG chewable tablet Chew by mouth every 4 (four) hours as needed.    . carvedilol (COREG) 6.25 MG tablet  Take 3.125 mg by mouth 2 (two) times daily with a meal.    . cholecalciferol (VITAMIN D) 1000 UNITS tablet Take 1,000 Units by mouth daily.    Marland Kitchen docusate sodium (COLACE) 100 MG capsule Take 100 mg by mouth 2 (two) times daily.    Marland Kitchen loperamide (IMODIUM A-D) 2 MG tablet Take 2 mg by mouth 4 (four) times daily as needed for diarrhea or loose stools.    Marland Kitchen LORazepam (ATIVAN) 0.5 MG tablet Take 0.5 mg by mouth every 4 (four) hours as needed. nausea    . mirtazapine (REMERON) 30 MG tablet Take 15 mg by mouth at bedtime.    . Multiple Vitamin (MULTIVITAMIN WITH MINERALS) TABS tablet Take 1 tablet by mouth daily.    . ondansetron (ZOFRAN) 8 MG tablet Take 8 mg by mouth every 8 (eight) hours as needed for nausea or vomiting.    . phosphorus (K PHOS NEUTRAL) 155-852-130 MG tablet Take by mouth 3 (three) times daily.    . potassium chloride SA (K-DUR,KLOR-CON) 20 MEQ tablet Take 40 mEq by mouth daily.     No current facility-administered medications for this visit.    PHYSICAL EXAMINATION: ECOG PERFORMANCE STATUS: 0 - Asymptomatic  Filed Vitals:   08/13/14 1111  BP: 128/75  Pulse: 95  Temp: 98.7 F (37.1 C)  Resp: 18   Filed Weights   08/13/14 1111  Weight: 142 lb 8 oz (64.638 kg)    GENERAL:alert, no distress and comfortable SKIN: skin color, texture, turgor are normal, no rashes or significant lesions EYES: normal, Conjunctiva are pink and non-injected, sclera clear OROPHARYNX:no exudate, no erythema and lips, buccal mucosa, and tongue normal  NECK: supple, thyroid normal size, non-tender, without nodularity LYMPH:  no palpable lymphadenopathy in the cervical, axillary or inguinal LUNGS: clear to auscultation and percussion with normal breathing effort HEART: regular rate & rhythm and no murmurs and no lower extremity edema ABDOMEN:abdomen soft, non-tender and normal bowel sounds Musculoskeletal:no cyanosis of digits and no clubbing  NEURO: alert & oriented x 3 with fluent speech, no  focal motor/sensory deficits  LABORATORY DATA:  I have reviewed the data as listed    Component Value Date/Time   NA 139 08/13/2014 1056   NA 140 04/23/2014 1208   K 4.9 08/13/2014 1056   K 3.9 04/23/2014 1208   CL 103 04/23/2014 1208   CO2 29 08/13/2014 1056   CO2 26 04/23/2014 1208   GLUCOSE 98 08/13/2014 1056   GLUCOSE 81 04/23/2014 1208   BUN 8.8 08/13/2014 1056   BUN 16 04/23/2014 1208   CREATININE 0.7 08/13/2014 1056   CREATININE 0.69 04/23/2014 1208   CALCIUM 9.2 08/13/2014 1056   CALCIUM 8.4 04/23/2014 1208   PROT 6.3* 08/13/2014 1056   PROT 8.5* 02/03/2014 0909   ALBUMIN 4.0 08/13/2014 1056   ALBUMIN 3.5 02/03/2014 0909   AST 14 08/13/2014 1056   AST 14 02/03/2014 0909   ALT 9 08/13/2014 1056   ALT 12 02/03/2014 0909   ALKPHOS 59 08/13/2014 1056   ALKPHOS 53 02/03/2014 0909   BILITOT 0.33 08/13/2014 1056   BILITOT <0.2* 02/03/2014 0909   GFRNONAA 88* 04/23/2014 1208   GFRAA >90 04/23/2014 1208    No results found for: SPEP, UPEP  Lab Results  Component Value Date   WBC 11.8* 08/13/2014   NEUTROABS  7.5* 08/13/2014   HGB 14.4 08/13/2014   HCT 42.7 08/13/2014   MCV 92.8 08/13/2014   PLT 75* 08/13/2014      Chemistry      Component Value Date/Time   NA 139 08/13/2014 1056   NA 140 04/23/2014 1208   K 4.9 08/13/2014 1056   K 3.9 04/23/2014 1208   CL 103 04/23/2014 1208   CO2 29 08/13/2014 1056   CO2 26 04/23/2014 1208   BUN 8.8 08/13/2014 1056   BUN 16 04/23/2014 1208   CREATININE 0.7 08/13/2014 1056   CREATININE 0.69 04/23/2014 1208      Component Value Date/Time   CALCIUM 9.2 08/13/2014 1056   CALCIUM 8.4 04/23/2014 1208   ALKPHOS 59 08/13/2014 1056   ALKPHOS 53 02/03/2014 0909   AST 14 08/13/2014 1056   AST 14 02/03/2014 0909   ALT 9 08/13/2014 1056   ALT 12 02/03/2014 0909   BILITOT 0.33 08/13/2014 1056   BILITOT <0.2* 02/03/2014 0909     I review all her outside records. ASSESSMENT & PLAN:  Multiple myeloma She is doing well  post transplant. She had one episode of neutropenic fever which has subsequently resolved. She had mild, nonproductive cough which is not new. She complained of fatigue. Continue supportive care. Her blood counts have recovered and she does not require transfusion support. I recommend routine blood work monitoring only until she returns to [facility] for further transplant follow-up. I have not made return appointment for the patient to come back unless [facility] decided to put her on some form of maintenance program.   Bone marrow transplant status She is doing well posttransplant. She will receive posttransplant vaccination at [facility] next month.   Thrombocytopenia due to drugs This is likely due to recent treatment. The patient denies recent history of bleeding such as epistaxis, hematuria or hematochezia. She is asymptomatic from the low platelet count. I will observe for now.  she does not require transfusion now.     No orders of the defined types were placed in this encounter.   All questions were answered. The patient knows to call the clinic with any problems, questions or concerns. No barriers to learning was detected. I spent 25 minutes counseling the patient face to face. The total time spent in the appointment was 30 minutes and more than 50% was on counseling and review of test results     [facility], Cristen Murcia, MD 08/14/2014 2:04 PM

## 2014-08-14 NOTE — Assessment & Plan Note (Signed)
She is doing well post transplant. She had one episode of neutropenic fever which has subsequently resolved. She had mild, nonproductive cough which is not new. She complained of fatigue. Continue supportive care. Her blood counts have recovered and she does not require transfusion support. I recommend routine blood work monitoring only until she returns to [facility] for further transplant follow-up. I have not made return appointment for the patient to come back unless [facility] decided to put her on some form of maintenance program.

## 2014-08-14 NOTE — Assessment & Plan Note (Signed)
This is likely due to recent treatment. The patient denies recent history of bleeding such as epistaxis, hematuria or hematochezia. She is asymptomatic from the low platelet count. I will observe for now.  she does not require transfusion now.  

## 2014-08-18 ENCOUNTER — Other Ambulatory Visit: Payer: Self-pay | Admitting: *Deleted

## 2014-08-18 ENCOUNTER — Telehealth: Payer: Self-pay | Admitting: *Deleted

## 2014-08-18 ENCOUNTER — Encounter: Payer: Self-pay | Admitting: *Deleted

## 2014-08-18 ENCOUNTER — Telehealth: Payer: Self-pay | Admitting: Hematology and Oncology

## 2014-08-18 NOTE — Telephone Encounter (Signed)
Just add labs weekly per patient request 3/1 and 3/8

## 2014-08-18 NOTE — Telephone Encounter (Signed)
s.wl. pt and advised on Feb adn march appt....pt ok and aware

## 2014-08-18 NOTE — Telephone Encounter (Signed)
Patient called reporting [facility] has notified her she needs labs the week of August 25, 2014 and the week of September 01, 2014.  08-13-2014 Labs were good but [facility] still wants weekly labs.  Will notify Dr. Alvy Bimler.   Return call number for patient is [phone].

## 2014-08-21 ENCOUNTER — Other Ambulatory Visit (HOSPITAL_BASED_OUTPATIENT_CLINIC_OR_DEPARTMENT_OTHER): Payer: Medicare HMO

## 2014-08-21 ENCOUNTER — Ambulatory Visit: Payer: Medicare HMO

## 2014-08-21 DIAGNOSIS — C9000 Multiple myeloma not having achieved remission: Secondary | ICD-10-CM

## 2014-08-21 LAB — CBC WITH DIFFERENTIAL/PLATELET
BASO%: 0.2 % (ref 0.0–2.0)
Basophils Absolute: 0 10*3/uL (ref 0.0–0.1)
EOS%: 0.1 % (ref 0.0–7.0)
Eosinophils Absolute: 0 10*3/uL (ref 0.0–0.5)
HCT: 37.2 % (ref 34.8–46.6)
HGB: 12.3 g/dL (ref 11.6–15.9)
LYMPH%: 21.5 % (ref 14.0–49.7)
MCH: 30.6 pg (ref 25.1–34.0)
MCHC: 33.1 g/dL (ref 31.5–36.0)
MCV: 92.5 fL (ref 79.5–101.0)
MONO#: 2.2 10*3/uL — ABNORMAL HIGH (ref 0.1–0.9)
MONO%: 25 % — ABNORMAL HIGH (ref 0.0–14.0)
NEUT#: 4.7 10*3/uL (ref 1.5–6.5)
NEUT%: 53.2 % (ref 38.4–76.8)
Platelets: 146 10*3/uL (ref 145–400)
RBC: 4.02 10*6/uL (ref 3.70–5.45)
RDW: 14.6 % — ABNORMAL HIGH (ref 11.2–14.5)
WBC: 8.9 10*3/uL (ref 3.9–10.3)
lymph#: 1.9 10*3/uL (ref 0.9–3.3)
nRBC: 0 % (ref 0–0)

## 2014-08-21 LAB — COMPREHENSIVE METABOLIC PANEL (CC13)
ALT: 7 U/L (ref 0–55)
AST: 13 U/L (ref 5–34)
Albumin: 3.5 g/dL (ref 3.5–5.0)
Alkaline Phosphatase: 50 U/L (ref 40–150)
Anion Gap: 8 mEq/L (ref 3–11)
BUN: 7.5 mg/dL (ref 7.0–26.0)
CO2: 27 mEq/L (ref 22–29)
Calcium: 8.8 mg/dL (ref 8.4–10.4)
Chloride: 107 mEq/L (ref 98–109)
Creatinine: 0.7 mg/dL (ref 0.6–1.1)
EGFR: >90 mL/min/{1.73_m2} (ref >90)
Glucose: 87 mg/dl (ref 70–140)
Potassium: 4 mEq/L (ref 3.5–5.1)
Sodium: 142 mEq/L (ref 136–145)
Total Bilirubin: 0.26 mg/dL (ref 0.20–1.20)
Total Protein: 5.9 g/dL — ABNORMAL LOW (ref 6.4–8.3)

## 2014-08-21 LAB — HOLD TUBE, BLOOD BANK

## 2014-08-21 LAB — TECHNOLOGIST REVIEW

## 2014-08-21 LAB — MAGNESIUM (CC13): Magnesium: 2.1 mg/dl (ref 1.5–2.5)

## 2014-08-21 NOTE — Progress Notes (Signed)
hgb 12.3, no blood needed today. March calendar and labs given to pt.

## 2014-08-25 ENCOUNTER — Other Ambulatory Visit (HOSPITAL_BASED_OUTPATIENT_CLINIC_OR_DEPARTMENT_OTHER): Payer: Medicare HMO

## 2014-08-25 DIAGNOSIS — D6959 Other secondary thrombocytopenia: Secondary | ICD-10-CM

## 2014-08-25 DIAGNOSIS — C9000 Multiple myeloma not having achieved remission: Secondary | ICD-10-CM

## 2014-08-25 LAB — MANUAL DIFFERENTIAL
ALC: 2.6 10*3/uL (ref 0.9–3.3)
ANC (CHCC manual diff): 3.5 10*3/uL (ref 1.5–6.5)
Band Neutrophils: 1 % (ref 0–10)
Basophil: 1 % (ref 0–2)
Blasts: 0 % (ref 0–0)
EOS: 1 % (ref 0–7)
LYMPH: 30 % (ref 14–49)
MONO: 27 % — ABNORMAL HIGH (ref 0–14)
Metamyelocytes: 1 % — ABNORMAL HIGH (ref 0–0)
Myelocytes: 0 % (ref 0–0)
Other Cell: 0 % (ref 0–0)
PLT EST: ADEQUATE
PROMYELO: 0 % (ref 0–0)
SEG: 39 % (ref 38–77)
Variant Lymph: 0 % (ref 0–0)
nRBC: 0 % (ref 0–0)

## 2014-08-25 LAB — COMPREHENSIVE METABOLIC PANEL (CC13)
ALT: 8 U/L (ref 0–55)
AST: 12 U/L (ref 5–34)
Albumin: 3.7 g/dL (ref 3.5–5.0)
Alkaline Phosphatase: 44 U/L (ref 40–150)
Anion Gap: 11 mEq/L (ref 3–11)
BUN: 8 mg/dL (ref 7.0–26.0)
CO2: 28 mEq/L (ref 22–29)
Calcium: 9.1 mg/dL (ref 8.4–10.4)
Chloride: 103 mEq/L (ref 98–109)
Creatinine: 0.7 mg/dL (ref 0.6–1.1)
EGFR: 89 mL/min/{1.73_m2} — ABNORMAL LOW (ref >90)
Glucose: 97 mg/dl (ref 70–140)
Potassium: 4.1 mEq/L (ref 3.5–5.1)
Sodium: 141 mEq/L (ref 136–145)
Total Bilirubin: 0.29 mg/dL (ref 0.20–1.20)
Total Protein: 6.1 g/dL — ABNORMAL LOW (ref 6.4–8.3)

## 2014-08-25 LAB — CBC WITH DIFFERENTIAL/PLATELET
HCT: 36.7 % (ref 34.8–46.6)
HGB: 12.2 g/dL (ref 11.6–15.9)
MCH: 30.7 pg (ref 25.1–34.0)
MCHC: 33.2 g/dL (ref 31.5–36.0)
MCV: 92.2 fL (ref 79.5–101.0)
Platelets: 142 10*3/uL — ABNORMAL LOW (ref 145–400)
RBC: 3.98 10*6/uL (ref 3.70–5.45)
RDW: 15 % — ABNORMAL HIGH (ref 11.2–14.5)
WBC: 8.5 10*3/uL (ref 3.9–10.3)

## 2014-08-25 LAB — HOLD TUBE, BLOOD BANK

## 2014-08-25 LAB — MAGNESIUM (CC13): Magnesium: 2.3 mg/dl (ref 1.5–2.5)

## 2014-08-27 ENCOUNTER — Encounter: Payer: Self-pay | Admitting: *Deleted

## 2014-08-27 NOTE — Progress Notes (Signed)
Faxed labs from 08/25/14 to Dr. Laverta Baltimore at [facility] fax [phone].

## 2014-08-28 ENCOUNTER — Other Ambulatory Visit: Payer: Self-pay | Admitting: Hematology and Oncology

## 2014-09-01 ENCOUNTER — Other Ambulatory Visit (HOSPITAL_BASED_OUTPATIENT_CLINIC_OR_DEPARTMENT_OTHER): Payer: Medicare HMO

## 2014-09-01 ENCOUNTER — Telehealth: Payer: Self-pay | Admitting: *Deleted

## 2014-09-01 DIAGNOSIS — D630 Anemia in neoplastic disease: Secondary | ICD-10-CM

## 2014-09-01 DIAGNOSIS — C9000 Multiple myeloma not having achieved remission: Secondary | ICD-10-CM

## 2014-09-01 LAB — COMPREHENSIVE METABOLIC PANEL (CC13)
ALT: 10 U/L (ref 0–55)
AST: 15 U/L (ref 5–34)
Albumin: 3.8 g/dL (ref 3.5–5.0)
Alkaline Phosphatase: 41 U/L (ref 40–150)
Anion Gap: 8 mEq/L (ref 3–11)
BUN: 10.7 mg/dL (ref 7.0–26.0)
CO2: 29 mEq/L (ref 22–29)
Calcium: 9 mg/dL (ref 8.4–10.4)
Chloride: 104 mEq/L (ref 98–109)
Creatinine: 0.7 mg/dL (ref 0.6–1.1)
EGFR: 89 mL/min/{1.73_m2} — ABNORMAL LOW (ref >90)
Glucose: 76 mg/dl (ref 70–140)
Potassium: 4.5 mEq/L (ref 3.5–5.1)
Sodium: 140 mEq/L (ref 136–145)
Total Bilirubin: 0.39 mg/dL (ref 0.20–1.20)
Total Protein: 6.2 g/dL — ABNORMAL LOW (ref 6.4–8.3)

## 2014-09-01 LAB — TECHNOLOGIST REVIEW

## 2014-09-01 LAB — CBC WITH DIFFERENTIAL/PLATELET
BASO%: 0.2 % (ref 0.0–2.0)
Basophils Absolute: 0 10*3/uL (ref 0.0–0.1)
EOS%: 1.4 % (ref 0.0–7.0)
Eosinophils Absolute: 0.1 10*3/uL (ref 0.0–0.5)
HCT: 36.2 % (ref 34.8–46.6)
HGB: 11.9 g/dL (ref 11.6–15.9)
LYMPH%: 19.7 % (ref 14.0–49.7)
MCH: 31 pg (ref 25.1–34.0)
MCHC: 32.9 g/dL (ref 31.5–36.0)
MCV: 94.3 fL (ref 79.5–101.0)
MONO#: 2.7 10*3/uL — ABNORMAL HIGH (ref 0.1–0.9)
MONO%: 48.2 % — ABNORMAL HIGH (ref 0.0–14.0)
NEUT#: 1.7 10*3/uL (ref 1.5–6.5)
NEUT%: 30.5 % — ABNORMAL LOW (ref 38.4–76.8)
Platelets: 121 10*3/uL — ABNORMAL LOW (ref 145–400)
RBC: 3.84 10*6/uL (ref 3.70–5.45)
RDW: 16.1 % — ABNORMAL HIGH (ref 11.2–14.5)
WBC: 5.7 10*3/uL (ref 3.9–10.3)
lymph#: 1.1 10*3/uL (ref 0.9–3.3)

## 2014-09-01 LAB — MAGNESIUM (CC13): Magnesium: 2.5 mg/dl (ref 1.5–2.5)

## 2014-09-01 LAB — HOLD TUBE, BLOOD BANK

## 2014-09-01 NOTE — Telephone Encounter (Signed)
S/w pt in lobby and gave her copy of lab results.  Pt has appt at [facility] next week and she will let us know when she is to schedule another appt with [name] after she has her f/u at [facility].   Faxed lab results to [facility] at fax [phone].

## 2014-09-21 ENCOUNTER — Telehealth: Payer: Self-pay | Admitting: Hematology and Oncology

## 2014-09-21 ENCOUNTER — Telehealth: Payer: Self-pay | Admitting: *Deleted

## 2014-09-21 NOTE — Telephone Encounter (Signed)
MD visit added per 03/28 POF, pt is aware.... KJ

## 2014-09-21 NOTE — Telephone Encounter (Signed)
Basically according to transplant coordinator, typically patients are started on maintenance treatment approximately 6 weeks post transplant. I recommend she comes in on 4/19 so that, if Dr. Alvie Heidelberg plan to start maintenance after her rtn appt on 4/14 I can take over from then. Remind her my clinic is consistently full so we better work her in now with option to cancel is better than to try to work her in the last minute

## 2014-09-21 NOTE — Telephone Encounter (Signed)
Called pt about appt to see Dr. Alvy Bimler on 4/19.   Pt says she has appt to see Dr. Alvie Heidelberg at [facility] on 4/14 and does she need to see Dr. Alvy Bimler so soon after her appt at [facility]?  Pt says she made this appt so Dr. Alvie Heidelberg can explain her lab work to her,  Pt says she got "mixed results" and needed face to face visit for MD to explain the results.

## 2014-09-21 NOTE — Telephone Encounter (Signed)
Informed pt of Dr. Calton Dach message below.  She agreed to appt on 4/19 at 11:30 am.  She does not want lab appt as she insists they will be doing lab at [facility] on 4/14.   Request sent to scheduler to add pt 4/19 at 11:30 am.

## 2014-09-22 ENCOUNTER — Other Ambulatory Visit: Payer: Self-pay | Admitting: Hematology and Oncology

## 2014-10-08 ENCOUNTER — Encounter: Payer: Self-pay | Admitting: Hematology and Oncology

## 2014-10-09 ENCOUNTER — Telehealth: Payer: Self-pay | Admitting: *Deleted

## 2014-10-09 NOTE — Telephone Encounter (Signed)
Pt left VM states she sees Dr. Alvy Bimler on 4/19 to discuss Revlimid.  Pt states she has a lot of "real questions" about taking revlimid.  She wants to make sure Dr. Alvy Bimler has received the information/records from [facility] she will need for this appt?

## 2014-10-12 ENCOUNTER — Other Ambulatory Visit: Payer: Self-pay | Admitting: Hematology and Oncology

## 2014-10-12 ENCOUNTER — Telehealth: Payer: Self-pay | Admitting: *Deleted

## 2014-10-12 NOTE — Telephone Encounter (Signed)
I have no received any records or phone calls from Dr. Alvie Heidelberg. I looked at [facility] records and there are no documentations from recent visits I would still be happy to meet with her tomorrow to address her questions

## 2014-10-12 NOTE — Telephone Encounter (Signed)
Pt requests Dr. Alvy Bimler s/w Dr. Alvie Heidelberg prior to her appt here tomorrow.  Dr. Alvy Bimler states she has not heard from Dr. Alvie Heidelberg and has not received any notes.  Dr. Erenest Blank office notes from last week are not complete in [place] yet.   I called to [facility] and informed pt requests Dr. Alvie Heidelberg communicate her opinion and recommendations to Dr. Alvy Bimler prior to her appt w/ [name] tomorrow.  Gave Dr. Calton Dach cell phone number.  Called pt and notified her of above and that Dr. Alvy Bimler can still see her tomorrow as scheduled.  Pt verbalized understanding.

## 2014-10-13 ENCOUNTER — Ambulatory Visit (HOSPITAL_BASED_OUTPATIENT_CLINIC_OR_DEPARTMENT_OTHER): Payer: Medicare HMO | Admitting: Hematology and Oncology

## 2014-10-13 ENCOUNTER — Telehealth: Payer: Self-pay | Admitting: Hematology and Oncology

## 2014-10-13 ENCOUNTER — Other Ambulatory Visit: Payer: Self-pay | Admitting: *Deleted

## 2014-10-13 ENCOUNTER — Other Ambulatory Visit (HOSPITAL_BASED_OUTPATIENT_CLINIC_OR_DEPARTMENT_OTHER): Payer: Medicare HMO

## 2014-10-13 ENCOUNTER — Telehealth: Payer: Self-pay | Admitting: *Deleted

## 2014-10-13 VITALS — BP 154/61 | HR 77 | Temp 98.1°F | Resp 18 | Ht 62.0 in | Wt 155.7 lb

## 2014-10-13 DIAGNOSIS — C9000 Multiple myeloma not having achieved remission: Secondary | ICD-10-CM

## 2014-10-13 DIAGNOSIS — D6959 Other secondary thrombocytopenia: Secondary | ICD-10-CM

## 2014-10-13 DIAGNOSIS — Z9481 Bone marrow transplant status: Secondary | ICD-10-CM

## 2014-10-13 DIAGNOSIS — T50905A Adverse effect of unspecified drugs, medicaments and biological substances, initial encounter: Secondary | ICD-10-CM

## 2014-10-13 LAB — COMPREHENSIVE METABOLIC PANEL (CC13)
ALT: 11 U/L (ref 0–55)
AST: 16 U/L (ref 5–34)
Albumin: 4.3 g/dL (ref 3.5–5.0)
Alkaline Phosphatase: 48 U/L (ref 40–150)
Anion Gap: 12 mEq/L — ABNORMAL HIGH (ref 3–11)
BUN: 10.2 mg/dL (ref 7.0–26.0)
CO2: 24 mEq/L (ref 22–29)
Calcium: 9.1 mg/dL (ref 8.4–10.4)
Chloride: 106 mEq/L (ref 98–109)
Creatinine: 0.6 mg/dL (ref 0.6–1.1)
EGFR: >90 mL/min/{1.73_m2} (ref >90)
Glucose: 89 mg/dl (ref 70–140)
Potassium: 4.4 mEq/L (ref 3.5–5.1)
Sodium: 141 mEq/L (ref 136–145)
Total Bilirubin: 0.52 mg/dL (ref 0.20–1.20)
Total Protein: 6.9 g/dL (ref 6.4–8.3)

## 2014-10-13 LAB — CBC WITH DIFFERENTIAL/PLATELET
BASO%: 0.2 % (ref 0.0–2.0)
Basophils Absolute: 0 10*3/uL (ref 0.0–0.1)
EOS%: 0.6 % (ref 0.0–7.0)
Eosinophils Absolute: 0 10*3/uL (ref 0.0–0.5)
HCT: 36.5 % (ref 34.8–46.6)
HGB: 12.2 g/dL (ref 11.6–15.9)
LYMPH%: 29.9 % (ref 14.0–49.7)
MCH: 31.9 pg (ref 25.1–34.0)
MCHC: 33.4 g/dL (ref 31.5–36.0)
MCV: 95.5 fL (ref 79.5–101.0)
MONO#: 1 10*3/uL — ABNORMAL HIGH (ref 0.1–0.9)
MONO%: 21 % — ABNORMAL HIGH (ref 0.0–14.0)
NEUT#: 2.3 10*3/uL (ref 1.5–6.5)
NEUT%: 48.3 % (ref 38.4–76.8)
Platelets: 139 10*3/uL — ABNORMAL LOW (ref 145–400)
RBC: 3.82 10*6/uL (ref 3.70–5.45)
RDW: 14.4 % (ref 11.2–14.5)
WBC: 4.9 10*3/uL (ref 3.9–10.3)
lymph#: 1.5 10*3/uL (ref 0.9–3.3)

## 2014-10-13 LAB — MAGNESIUM (CC13): Magnesium: 2.4 mg/dl (ref 1.5–2.5)

## 2014-10-13 MED ORDER — LENALIDOMIDE 25 MG PO CAPS: 25.0000 mg | ORAL_CAPSULE | Freq: Every day | ORAL | Status: DC

## 2014-10-13 NOTE — Telephone Encounter (Signed)
Pt requests Revlimid be shipped to [facility] instead of her home since she will be out of town until her next appt.  Faxed Rx to [facility] and notified Jasmine at [facility] of request to ship Revlimid to our facility to Dr. Alvy Bimler.

## 2014-10-13 NOTE — Telephone Encounter (Signed)
Gave avs & calendar for Jean Davidson. Sent patient to have labs. Sent message to schedule treatment.

## 2014-10-13 NOTE — Telephone Encounter (Signed)
Per staff message and POF I have scheduled appts. Advised scheduler of appts. JMW  

## 2014-10-15 MED ORDER — PROCHLORPERAZINE MALEATE 10 MG PO TABS: 10.0000 mg | ORAL_TABLET | Freq: Four times a day (QID) | ORAL | Status: DC | PRN

## 2014-10-15 MED ORDER — LIDOCAINE-PRILOCAINE 2.5-2.5 % EX CREA: TOPICAL_CREAM | CUTANEOUS | Status: DC

## 2014-10-15 MED ORDER — ONDANSETRON HCL 8 MG PO TABS: 8.0000 mg | ORAL_TABLET | Freq: Two times a day (BID) | ORAL | Status: DC

## 2014-10-15 NOTE — Assessment & Plan Note (Addendum)
He has a long discussion regarding treatment options. The patient has residual disease and at this point in time she needs to be restarted back on treatment. We discussed the role of chemotherapy. The intent is for palliative.  We discussed some of the risks, benefits, side-effects of Kyprolis, Revlimid & Dexamethasone.  Some of the short term side-effects included, though not limited to, including risk of fatigue, weight gain, high blood sugar, high blood pressure, stomach ulcers, pancytopenia, allergic reactions, life-threatening infections, need for transfusions of blood products, nausea, vomiting, change in bowel habits, blood clots, admission to hospital for various reasons, and risks of death.   Long term side-effects are also discussed including risks of infertility, permanent damage to nerve function, chronic fatigue, and rare secondary malignancy including bone marrow disorders and leukemia.   The patient is aware that the response rates discussed earlier is not guaranteed.  After a long discussion, patient made an informed decision to proceed with the prescribed plan of care and went ahead to sign the consent form today.   Patient education materials were dispensed I recommend holding all Zometa. I recommend she sees a dentist to obtain dental clearance.

## 2014-10-15 NOTE — Progress Notes (Signed)
[facility] OFFICE PROGRESS NOTE  Patient Care Team: Jean Sanders, MD as PCP - General (Family Medicine) [name], MD as Referring Physician (Hematology and Oncology) [name], MD as Consulting Physician (Internal Medicine)  SUMMARY OF ONCOLOGIC HISTORY: Oncology History   Multiple myeloma, Ig A Lambda, M spike 3.54 grams, Calcium 9.2, Creatinine 0.8, Beta 2 microglobulin 4.52, IgA 4840 mg/dL, lambda light chain 75.4, albumin 3.6, hemoglobin 9.7, platelet 115    Primary site: Multiple Myeloma   Staging method: AJCC 6th Edition   Clinical: Stage IIA signed by Jean Lark, MD on 11/07/2013  2:46 PM   Summary: Stage IIA        Multiple myeloma   10/31/2013 Bone Marrow Biopsy Bone marrow biopsy confirmed multiple myeloma with 40% bone marrow involvement. Skeletal survey showed minimal lesions in her score with generalized demineralization   11/10/2013 - 02/13/2014 Chemotherapy The patient is started on induction chemotherapy with weekly dexamethasone 40 mg by mouth as well as Velcade subcutaneous injection on days 1, 4, 8 and 11. On 11/21/2013, she was started on monthly Zometa.   12/23/2013 Adverse Reaction The dose of Velcade was reduced due to thrombocytopenia.   01/28/2014 - 04/07/2014 Chemotherapy Revlimid is added. Treatment was discontinued due to lack of response.   02/24/2014 - 04/07/2014 Chemotherapy Due to worsening peripheral neuropathy, Velcade injection is changed to once a week. Revlimid was given 21 days on, 7 days off.   04/07/2014 - 04/10/2014 Chemotherapy Revlimid was discontinued due to lack of response. Chemotherapy was changed back to Velcade injection twice a week, 2 weeks on 1 week off. Her treatment was switched to to minimum response   04/20/2014 - 06/02/2014 Chemotherapy chemotherapy is switched to Carfilzomib, Cytoxan and dexamethasone.   04/22/2014 Procedure she has placement of port for chemotherapy.   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 She returns today to review the plan for chemotherapy. She has recovered completely from recent bone marrow transplant.  REVIEW OF SYSTEMS:   Constitutional: Denies fevers, chills or abnormal weight loss Eyes: Denies blurriness of vision Ears, nose, mouth, throat, and face: Denies mucositis or sore throat Respiratory: Denies cough, dyspnea or wheezes Cardiovascular: Denies palpitation, chest discomfort or lower extremity swelling Gastrointestinal:  Denies nausea, heartburn or change in bowel habits Skin: Denies abnormal skin rashes Lymphatics: Denies new lymphadenopathy or easy bruising Neurological:Denies numbness, tingling or new weaknesses Behavioral/Psych: Mood is stable, no new changes  All other systems were reviewed with the patient and are negative.  I have reviewed the past medical history, past surgical history, social history and family history with the patient and they are unchanged from previous note.  ALLERGIES:  is allergic to penicillins and sulfa antibiotics.  MEDICATIONS:  Current Outpatient Prescriptions  Medication Sig Dispense Refill  . acyclovir (ZOVIRAX) 400 MG tablet Take 1 tablet (400 mg total) by mouth 2 (two) times daily. 180 tablet 3  . ALPRAZolam (XANAX) 0.5 MG tablet Take 0.5 mg by mouth 2 (two) times daily as needed for anxiety.    . calcium carbonate (TUMS  EX) 750 MG chewable tablet Chew by mouth every 4 (four) hours as needed.    . carvedilol (COREG) 6.25 MG tablet Take 3.125 mg by mouth 2 (two)  times daily with a meal.    . cholecalciferol (VITAMIN D) 1000 UNITS tablet Take 1,000 Units by mouth daily.    Marland Kitchen docusate sodium (COLACE) 100 MG capsule Take 100 mg by mouth 2 (two) times daily.    Marland Kitchen loperamide (IMODIUM A-D) 2 MG tablet Take 2 mg by mouth 4 (four) times daily as needed for diarrhea or loose stools.    . mirtazapine (REMERON) 30 MG tablet TAKE 1 TABLET BY MOUTH AT BEDTIME 60 tablet 1  . Multiple Vitamin (MULTIVITAMIN WITH MINERALS) TABS tablet Take 1 tablet by mouth daily.    . ondansetron (ZOFRAN) 8 MG tablet Take 8 mg by mouth every 8 (eight) hours as needed for nausea or vomiting.    . phosphorus (K PHOS NEUTRAL) 155-852-130 MG tablet Take by mouth daily.     . potassium chloride SA (K-DUR,KLOR-CON) 20 MEQ tablet Take 20 mEq by mouth daily.     Marland Kitchen lenalidomide (REVLIMID) 25 MG capsule Take 1 capsule (25 mg total) by mouth daily. 21 capsule 0   No current facility-administered medications for this visit.    PHYSICAL EXAMINATION: ECOG PERFORMANCE STATUS: 0 - Asymptomatic  Filed Vitals:   10/13/14 1105  BP: 154/61  Pulse: 77  Temp: 98.1 F (36.7 C)  Resp: 18   Filed Weights   10/13/14 1105  Weight: 155 lb 11.2 oz (70.625 kg)    GENERAL:alert, no distress and comfortable SKIN: skin color, texture, turgor are normal, no rashes or significant lesions EYES: normal, Conjunctiva are pink and non-injected, sclera clear OROPHARYNX:no exudate, no erythema and lips, buccal mucosa, and tongue normal  NECK: supple, thyroid normal size, non-tender, without nodularity LYMPH:  no palpable lymphadenopathy in the cervical, axillary or inguinal LUNGS: clear to auscultation and percussion with normal breathing effort HEART: regular rate & rhythm and no murmurs and no lower extremity edema ABDOMEN:abdomen soft, non-tender and normal bowel sounds Musculoskeletal:no cyanosis of digits and no clubbing  NEURO: alert & oriented x 3 with fluent speech, no focal motor/sensory  deficits  LABORATORY DATA:  I have reviewed the data as listed    Component Value Date/Time   NA 141 10/13/2014 1231   NA 140 04/23/2014 1208   K 4.4 10/13/2014 1231   K 3.9 04/23/2014 1208   CL 103 04/23/2014 1208   CO2 24 10/13/2014 1231   CO2 26 04/23/2014 1208   GLUCOSE 89 10/13/2014 1231   GLUCOSE 81 04/23/2014 1208   BUN 10.2 10/13/2014 1231   BUN 16 04/23/2014 1208   CREATININE 0.6 10/13/2014 1231   CREATININE 0.69 04/23/2014 1208   CALCIUM 9.1 10/13/2014 1231   CALCIUM 8.4 04/23/2014 1208   PROT 6.9 10/13/2014 1231   PROT 8.5* 02/03/2014 0909   ALBUMIN 4.3 10/13/2014 1231   ALBUMIN 3.5 02/03/2014 0909   AST 16 10/13/2014 1231   AST 14 02/03/2014 0909   ALT 11 10/13/2014 1231   ALT 12 02/03/2014 0909   ALKPHOS 48 10/13/2014 1231   ALKPHOS 53 02/03/2014 0909   BILITOT 0.52 10/13/2014 1231   BILITOT <0.2* 02/03/2014 0909   GFRNONAA 88* 04/23/2014 1208   GFRAA >90 04/23/2014 1208    No results found for: SPEP, UPEP  Lab Results  Component Value Date   WBC 4.9 10/13/2014   NEUTROABS 2.3 10/13/2014   HGB  12.2 10/13/2014   HCT 36.5 10/13/2014   MCV 95.5 10/13/2014   PLT 139* 10/13/2014      Chemistry      Component Value Date/Time   NA 141 10/13/2014 1231   NA 140 04/23/2014 1208   K 4.4 10/13/2014 1231   K 3.9 04/23/2014 1208   CL 103 04/23/2014 1208   CO2 24 10/13/2014 1231   CO2 26 04/23/2014 1208   BUN 10.2 10/13/2014 1231   BUN 16 04/23/2014 1208   CREATININE 0.6 10/13/2014 1231   CREATININE 0.69 04/23/2014 1208      Component Value Date/Time   CALCIUM 9.1 10/13/2014 1231   CALCIUM 8.4 04/23/2014 1208   ALKPHOS 48 10/13/2014 1231   ALKPHOS 53 02/03/2014 0909   AST 16 10/13/2014 1231   AST 14 02/03/2014 0909   ALT 11 10/13/2014 1231   ALT 12 02/03/2014 0909   BILITOT 0.52 10/13/2014 1231   BILITOT <0.2* 02/03/2014 0909      ASSESSMENT & PLAN:  Multiple myeloma He has a long discussion regarding treatment options. The patient has  residual disease and at this point in time she needs to be restarted back on treatment. We discussed the role of chemotherapy. The intent is for palliative.  We discussed some of the risks, benefits, side-effects of Kyprolis, Revlimid & Dexamethasone.  Some of the short term side-effects included, though not limited to, including risk of fatigue, weight gain, high blood sugar, high blood pressure, stomach ulcers, pancytopenia, allergic reactions, life-threatening infections, need for transfusions of blood products, nausea, vomiting, change in bowel habits, blood clots, admission to hospital for various reasons, and risks of death.   Long term side-effects are also discussed including risks of infertility, permanent damage to nerve function, chronic fatigue, and rare secondary malignancy including bone marrow disorders and leukemia.   The patient is aware that the response rates discussed earlier is not guaranteed.  After a long discussion, patient made an informed decision to proceed with the prescribed plan of care and went ahead to sign the consent form today.   Patient education materials were dispensed I recommend holding all Zometa. I recommend she sees a dentist to obtain dental clearance.    Thrombocytopenia due to drugs This is likely due to recent treatment. The patient denies recent history of bleeding such as epistaxis, hematuria or hematochezia. She is asymptomatic from the low platelet count. I will observe for now.  she does not require transfusion now.      Bone marrow transplant status She is doing well posttransplant. She will receive posttransplant vaccination in July and I will administer that for her. She will continue prophylactic antimicrobial therapy. I will hold Zometa until next month.   I recommend port placement for long-term chemotherapy. Orders Placed This Encounter  Procedures  . IR Fluoro Guide CV Line Right    Indicate type of CVC ordering    Standing  Status: Future     Number of Occurrences:      Standing Expiration Date: 12/13/2015    Order Specific Question:  Reason for exam:    Answer:  need port for chemo ASAP    Order Specific Question:  Preferred Imaging Location?    Answer:  [facility]   All questions were answered. The patient knows to call the clinic with any problems, questions or concerns. No barriers to learning was detected. I spent 30 minutes counseling the patient face to face. The total time spent in the appointment was  40 minutes and more than 50% was on counseling and review of test results     [facility], Cabela Pacifico, MD 10/15/2014 8:53 AM

## 2014-10-15 NOTE — Assessment & Plan Note (Signed)
This is likely due to recent treatment. The patient denies recent history of bleeding such as epistaxis, hematuria or hematochezia. She is asymptomatic from the low platelet count. I will observe for now.  she does not require transfusion now.  

## 2014-10-15 NOTE — Assessment & Plan Note (Signed)
She is doing well posttransplant. She will receive posttransplant vaccination in July and I will administer that for her. She will continue prophylactic antimicrobial therapy. I will hold Zometa until next month.

## 2014-10-16 ENCOUNTER — Telehealth: Payer: Self-pay | Admitting: *Deleted

## 2014-10-16 NOTE — Telephone Encounter (Signed)
Pt left a message stating she will pick up her revlimid on Monday, will start on Tuesday. Wants to know how much dexamethasone to take and how often. Will call her on Monday with directions.

## 2014-10-19 ENCOUNTER — Telehealth: Payer: Self-pay | Admitting: *Deleted

## 2014-10-19 NOTE — Telephone Encounter (Signed)
Notified patient that we will be giving her dexamethasone with her kyprolis, so we will not be taking dex at home

## 2014-10-20 ENCOUNTER — Ambulatory Visit (HOSPITAL_BASED_OUTPATIENT_CLINIC_OR_DEPARTMENT_OTHER): Payer: Medicare HMO

## 2014-10-20 ENCOUNTER — Other Ambulatory Visit: Payer: Self-pay | Admitting: Hematology and Oncology

## 2014-10-20 ENCOUNTER — Other Ambulatory Visit: Payer: Self-pay | Admitting: Radiology

## 2014-10-20 VITALS — BP 124/66 | HR 90 | Temp 98.3°F | Resp 18

## 2014-10-20 DIAGNOSIS — C9000 Multiple myeloma not having achieved remission: Secondary | ICD-10-CM

## 2014-10-20 DIAGNOSIS — Z5112 Encounter for antineoplastic immunotherapy: Secondary | ICD-10-CM | POA: Diagnosis not present

## 2014-10-20 MED ORDER — SODIUM CHLORIDE 0.9 % IV SOLN
Freq: Once | INTRAVENOUS | Status: AC
  Administered 2014-10-20: 14:00:00 via INTRAVENOUS

## 2014-10-20 MED ORDER — SODIUM CHLORIDE 0.9 % IV SOLN
Freq: Once | INTRAVENOUS | Status: AC
  Administered 2014-10-20: 14:00:00 via INTRAVENOUS
  Filled 2014-10-20: qty 4

## 2014-10-20 MED ORDER — DEXTROSE 5 % IV SOLN
20.0000 mg/m2 | Freq: Once | INTRAVENOUS | Status: AC
  Administered 2014-10-20: 36 mg via INTRAVENOUS
  Filled 2014-10-20: qty 18

## 2014-10-20 MED ORDER — LIDOCAINE-PRILOCAINE 2.5-2.5 % EX CREA: 1.0000 "application " | TOPICAL_CREAM | CUTANEOUS | Status: DC | PRN

## 2014-10-20 MED ORDER — ONDANSETRON HCL 8 MG PO TABS: 8.0000 mg | ORAL_TABLET | Freq: Three times a day (TID) | ORAL | Status: DC | PRN

## 2014-10-20 MED ORDER — TRIAMCINOLONE ACETONIDE 55 MCG/ACT NA AERO: 2.0000 | INHALATION_SPRAY | Freq: Every day | NASAL | Status: DC

## 2014-10-20 NOTE — Patient Instructions (Signed)
[facility] Discharge Instructions for Patients Receiving Chemotherapy  Today you received the following chemotherapy agents Kyprolis.  To help prevent nausea and vomiting after your treatment, we encourage you to take your nausea medication as prescribed.   If you develop nausea and vomiting that is not controlled by your nausea medication, call the clinic.   BELOW ARE SYMPTOMS THAT SHOULD BE REPORTED IMMEDIATELY:  *FEVER GREATER THAN 100.5 F  *CHILLS WITH OR WITHOUT FEVER  NAUSEA AND VOMITING THAT IS NOT CONTROLLED WITH YOUR NAUSEA MEDICATION  *UNUSUAL SHORTNESS OF BREATH  *UNUSUAL BRUISING OR BLEEDING  TENDERNESS IN MOUTH AND THROAT WITH OR WITHOUT PRESENCE OF ULCERS  *URINARY PROBLEMS  *BOWEL PROBLEMS  UNUSUAL RASH Items with * indicate a potential emergency and should be followed up as soon as possible.  Feel free to call the clinic you have any questions or concerns. The clinic phone number is [phone].  Please show the CHEMO ALERT CARD at check-in to the Emergency Department and triage nurse.   

## 2014-10-21 ENCOUNTER — Other Ambulatory Visit: Payer: Self-pay | Admitting: Radiology

## 2014-10-21 ENCOUNTER — Ambulatory Visit (HOSPITAL_BASED_OUTPATIENT_CLINIC_OR_DEPARTMENT_OTHER): Payer: Medicare HMO

## 2014-10-21 VITALS — BP 131/57 | HR 73 | Temp 97.3°F | Resp 20

## 2014-10-21 DIAGNOSIS — C9000 Multiple myeloma not having achieved remission: Secondary | ICD-10-CM | POA: Diagnosis not present

## 2014-10-21 DIAGNOSIS — Z5112 Encounter for antineoplastic immunotherapy: Secondary | ICD-10-CM

## 2014-10-21 MED ORDER — HEPARIN SOD (PORK) LOCK FLUSH 100 UNIT/ML IV SOLN
500.0000 [IU] | Freq: Once | INTRAVENOUS | Status: DC | PRN
  Filled 2014-10-21: qty 5

## 2014-10-21 MED ORDER — SODIUM CHLORIDE 0.9 % IV SOLN
Freq: Once | INTRAVENOUS | Status: AC
  Administered 2014-10-21: 14:00:00 via INTRAVENOUS

## 2014-10-21 MED ORDER — DEXTROSE 5 % IV SOLN
20.0000 mg/m2 | Freq: Once | INTRAVENOUS | Status: AC
  Administered 2014-10-21: 36 mg via INTRAVENOUS
  Filled 2014-10-21: qty 18

## 2014-10-21 MED ORDER — SODIUM CHLORIDE 0.9 % IV SOLN
Freq: Once | INTRAVENOUS | Status: AC
  Administered 2014-10-21: 14:00:00 via INTRAVENOUS
  Filled 2014-10-21: qty 4

## 2014-10-21 MED ORDER — SODIUM CHLORIDE 0.9 % IJ SOLN
10.0000 mL | INTRAMUSCULAR | Status: DC | PRN
  Filled 2014-10-21: qty 10

## 2014-10-21 NOTE — Patient Instructions (Signed)
[facility] Discharge Instructions for Patients Receiving Chemotherapy  Today you received the following chemotherapy agents: Kyprolis  To help prevent nausea and vomiting after your treatment, we encourage you to take your nausea medication as prescribed by your physician.   If you develop nausea and vomiting that is not controlled by your nausea medication, call the clinic.   BELOW ARE SYMPTOMS THAT SHOULD BE REPORTED IMMEDIATELY:  *FEVER GREATER THAN 100.5 F  *CHILLS WITH OR WITHOUT FEVER  NAUSEA AND VOMITING THAT IS NOT CONTROLLED WITH YOUR NAUSEA MEDICATION  *UNUSUAL SHORTNESS OF BREATH  *UNUSUAL BRUISING OR BLEEDING  TENDERNESS IN MOUTH AND THROAT WITH OR WITHOUT PRESENCE OF ULCERS  *URINARY PROBLEMS  *BOWEL PROBLEMS  UNUSUAL RASH Items with * indicate a potential emergency and should be followed up as soon as possible.  Feel free to call the clinic you have any questions or concerns. The clinic phone number is [phone].  Please show the CHEMO ALERT CARD at check-in to the Emergency Department and triage nurse.   

## 2014-10-22 ENCOUNTER — Ambulatory Visit (HOSPITAL_COMMUNITY)
Admission: RE | Admit: 2014-10-22 | Discharge: 2014-10-22 | Disposition: A | Discharge status: Home / Self Care | Payer: Medicare HMO | Source: Ambulatory Visit | Attending: Hematology and Oncology | Admitting: Hematology and Oncology

## 2014-10-22 ENCOUNTER — Telehealth: Payer: Self-pay | Admitting: Hematology and Oncology

## 2014-10-22 ENCOUNTER — Other Ambulatory Visit: Payer: Self-pay | Admitting: Hematology and Oncology

## 2014-10-22 ENCOUNTER — Ambulatory Visit (HOSPITAL_COMMUNITY)
Admission: RE | Admit: 2014-10-22 | Discharge: 2014-10-22 | Disposition: A | Discharge status: Home / Self Care | Payer: Medicare HMO | Source: Ambulatory Visit | Attending: Interventional Radiology | Admitting: Interventional Radiology

## 2014-10-22 ENCOUNTER — Encounter (HOSPITAL_COMMUNITY): Payer: Self-pay

## 2014-10-22 DIAGNOSIS — D696 Thrombocytopenia, unspecified: Secondary | ICD-10-CM | POA: Diagnosis not present

## 2014-10-22 DIAGNOSIS — C9000 Multiple myeloma not having achieved remission: Secondary | ICD-10-CM | POA: Diagnosis not present

## 2014-10-22 DIAGNOSIS — Z452 Encounter for adjustment and management of vascular access device: Secondary | ICD-10-CM | POA: Insufficient documentation

## 2014-10-22 DIAGNOSIS — M4802 Spinal stenosis, cervical region: Secondary | ICD-10-CM | POA: Insufficient documentation

## 2014-10-22 DIAGNOSIS — K219 Gastro-esophageal reflux disease without esophagitis: Secondary | ICD-10-CM | POA: Insufficient documentation

## 2014-10-22 DIAGNOSIS — I6523 Occlusion and stenosis of bilateral carotid arteries: Secondary | ICD-10-CM | POA: Diagnosis not present

## 2014-10-22 DIAGNOSIS — I10 Essential (primary) hypertension: Secondary | ICD-10-CM | POA: Diagnosis not present

## 2014-10-22 DIAGNOSIS — Z79899 Other long term (current) drug therapy: Secondary | ICD-10-CM | POA: Insufficient documentation

## 2014-10-22 DIAGNOSIS — F419 Anxiety disorder, unspecified: Secondary | ICD-10-CM | POA: Insufficient documentation

## 2014-10-22 DIAGNOSIS — M5030 Other cervical disc degeneration, unspecified cervical region: Secondary | ICD-10-CM | POA: Insufficient documentation

## 2014-10-22 DIAGNOSIS — Z87891 Personal history of nicotine dependence: Secondary | ICD-10-CM | POA: Diagnosis not present

## 2014-10-22 DIAGNOSIS — G5600 Carpal tunnel syndrome, unspecified upper limb: Secondary | ICD-10-CM | POA: Insufficient documentation

## 2014-10-22 LAB — BASIC METABOLIC PANEL
Anion gap: 7 (ref 5–15)
BUN: 13 mg/dL (ref 6–23)
CO2: 23 mmol/L (ref 19–32)
Calcium: 8.8 mg/dL (ref 8.4–10.5)
Chloride: 109 mmol/L (ref 96–112)
Creatinine, Ser: 0.68 mg/dL (ref 0.50–1.10)
GFR calc Af Amer: >90 mL/min (ref >90)
GFR calc non Af Amer: 88 mL/min — ABNORMAL LOW (ref >90)
Glucose, Bld: 113 mg/dL — ABNORMAL HIGH (ref 70–99)
Potassium: 4 mmol/L (ref 3.5–5.1)
Sodium: 139 mmol/L (ref 135–145)

## 2014-10-22 LAB — CBC
HCT: 35.8 % — ABNORMAL LOW (ref 36.0–46.0)
Hemoglobin: 11.6 g/dL — ABNORMAL LOW (ref 12.0–15.0)
MCH: 31.7 pg (ref 26.0–34.0)
MCHC: 32.4 g/dL (ref 30.0–36.0)
MCV: 97.8 fL (ref 78.0–100.0)
Platelets: 96 10*3/uL — ABNORMAL LOW (ref 150–400)
RBC: 3.66 MIL/uL — ABNORMAL LOW (ref 3.87–5.11)
RDW: 13.8 % (ref 11.5–15.5)
WBC: 10.9 10*3/uL — ABNORMAL HIGH (ref 4.0–10.5)

## 2014-10-22 LAB — APTT: aPTT: 47 seconds — ABNORMAL HIGH (ref 24–37)

## 2014-10-22 LAB — PROTIME-INR
INR: 1.08 (ref 0.00–1.49)
Prothrombin Time: 14.1 seconds (ref 11.6–15.2)

## 2014-10-22 MED ORDER — MIDAZOLAM HCL 2 MG/2ML IJ SOLN
INTRAMUSCULAR | Status: AC | PRN
  Administered 2014-10-22: 1 mg via INTRAVENOUS
  Administered 2014-10-22 (×4): 0.5 mg via INTRAVENOUS

## 2014-10-22 MED ORDER — HEPARIN SOD (PORK) LOCK FLUSH 100 UNIT/ML IV SOLN
INTRAVENOUS | Status: AC
  Filled 2014-10-22: qty 5

## 2014-10-22 MED ORDER — FENTANYL CITRATE (PF) 100 MCG/2ML IJ SOLN
INTRAMUSCULAR | Status: AC | PRN
  Administered 2014-10-22: 50 ug via INTRAVENOUS

## 2014-10-22 MED ORDER — MIDAZOLAM HCL 2 MG/2ML IJ SOLN
INTRAMUSCULAR | Status: AC
  Filled 2014-10-22: qty 6

## 2014-10-22 MED ORDER — VANCOMYCIN HCL IN DEXTROSE 1-5 GM/200ML-% IV SOLN
1000.0000 mg | Freq: Once | INTRAVENOUS | Status: AC
  Administered 2014-10-22: 1000 mg via INTRAVENOUS
  Filled 2014-10-22: qty 200

## 2014-10-22 MED ORDER — LIDOCAINE-EPINEPHRINE 2 %-1:100000 IJ SOLN
INTRAMUSCULAR | Status: AC
  Filled 2014-10-22: qty 1

## 2014-10-22 MED ORDER — FENTANYL CITRATE (PF) 100 MCG/2ML IJ SOLN
INTRAMUSCULAR | Status: AC
  Filled 2014-10-22: qty 4

## 2014-10-22 MED ORDER — SODIUM CHLORIDE 0.9 % IV SOLN
INTRAVENOUS | Status: DC
  Administered 2014-10-22: 08:00:00 via INTRAVENOUS

## 2014-10-22 NOTE — Telephone Encounter (Signed)
returned call no answer °

## 2014-10-22 NOTE — Discharge Instructions (Signed)
Conscious Sedation, Adult, Care After °Refer to this sheet in the next few weeks. These instructions provide you with information on caring for yourself after your procedure. Your health care provider may also give you more specific instructions. Your treatment has been planned according to current medical practices, but problems sometimes occur. Call your health care provider if you have any problems or questions after your procedure. °WHAT TO EXPECT AFTER THE PROCEDURE  °After your procedure: °· You may feel sleepy, clumsy, and have poor balance for several hours. °· Vomiting may occur if you eat too soon after the procedure. °HOME CARE INSTRUCTIONS °· Do not participate in any activities where you could become injured for at least 24 hours. Do not: °¨ Drive. °¨ Swim. °¨ Ride a bicycle. °¨ Operate heavy machinery. °¨ Cook. °¨ Use power tools. °¨ Climb ladders. °¨ Work from a high place. °· Do not make important decisions or sign legal documents until you are improved. °· If you vomit, drink water, juice, or soup when you can drink without vomiting. Make sure you have little or no nausea before eating solid foods. °· Only take over-the-counter or prescription medicines for pain, discomfort, or fever as directed by your health care provider. °· Make sure you and your family fully understand everything about the medicines given to you, including what side effects may occur. °· You should not drink alcohol, take sleeping pills, or take medicines that cause drowsiness for at least 24 hours. °· If you smoke, do not smoke without supervision. °· If you are feeling better, you may resume normal activities 24 hours after you were sedated. °· Keep all appointments with your health care provider. °SEEK MEDICAL CARE IF: °· Your skin is pale or bluish in color. °· You continue to feel nauseous or vomit. °· Your pain is getting worse and is not helped by medicine. °· You have bleeding or swelling. °· You are still sleepy or  feeling clumsy after 24 hours. °SEEK IMMEDIATE MEDICAL CARE IF: °· You develop a rash. °· You have difficulty breathing. °· You develop any type of allergic problem. °· You have a fever. °MAKE SURE YOU: °· Understand these instructions. °· Will watch your condition. °· Will get help right away if you are not doing well or get worse. °Document Released: 04/02/2013 Document Reviewed: 04/02/2013 °[employer]® Patient Information ©2015 [employer]. This information is not intended to replace advice given to you by your health care provider. Make sure you discuss any questions you have with your health care provider. °Implanted Port Insertion, Care After °Refer to this sheet in the next few weeks. These instructions provide you with information on caring for yourself after your procedure. Your health care provider may also give you more specific instructions. Your treatment has been planned according to current medical practices, but problems sometimes occur. Call your health care provider if you have any problems or questions after your procedure. °WHAT TO EXPECT AFTER THE PROCEDURE °After your procedure, it is typical to have the following:  °· Discomfort at the port insertion site. Ice packs to the area will help. °· Bruising on the skin over the port. This will subside in 3-4 days. °HOME CARE INSTRUCTIONS °· After your port is placed, you will get a manufacturer's information card. The card has information about your port. Keep this card with you at all times.   °· Know what kind of port you have. There are many types of ports available.   °· Wear a medical alert   bracelet in case of an emergency. This can help alert health care workers that you have a port.   °· The port can stay in for as long as your health care provider believes it is necessary.   °· A home health care nurse may give medicines and take care of the port.   °· You or a family member can get special training and directions for giving medicine and  taking care of the port at home.   °SEEK MEDICAL CARE IF:  °· Your port does not flush or you are unable to get a blood return.   °· You have a fever or chills. °SEEK IMMEDIATE MEDICAL CARE IF: °· You have new fluid or pus coming from your incision.   °· You notice a bad smell coming from your incision site.   °· You have swelling, pain, or more redness at the incision or port site.   °· You have chest pain or shortness of breath. °Document Released: 04/02/2013 Document Revised: 06/17/2013 Document Reviewed: 04/02/2013 °[employer]® Patient Information ©2015 [employer]. This information is not intended to replace advice given to you by your health care provider. Make sure you discuss any questions you have with your health care provider. ° °

## 2014-10-22 NOTE — Procedures (Signed)
Successful placement of right IJ approach port-a-cath with tip at the superior caval atrial junction. The catheter is ready for immediate use. No immediate post procedural complications. 

## 2014-10-22 NOTE — H&P (Signed)
Referring Physician(s): Gorsuch,Ni  History of Present Illness: Jean Davidson is a 69 y.o. 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 The patient was seen by Dr. Alvy Bimler on 10/15/14 and scheduled today for image guided port a catheter. The patient has had a H&P performed within the last 30 days, all history, medications, and exam have been reviewed. The patient denies any interval changes since the H&P. She denies any fever or chills. She denies any urinary symptoms. She does c/o nasal congestion.    Past Medical History  Diagnosis Date  . Cervical disc disease     c7  . Spinal stenosis in cervical region   . Carpal tunnel syndrome   . Pinched nerve   . Varicose veins of lower extremities with inflammation   . Disc degeneration   . Hypertension     Borderline.  . Carotid stenosis     Mild bilateral  . Anemia   . Anxiety   . GERD (gastroesophageal reflux disease)   . Cancer     Past Surgical History  Procedure Laterality Date  . Colonoscopy    . Bladder tact  1988/1989    x2  . Shoulder surgery Right 04/06/13    right  . Port-a-cath removal      Allergies: Penicillins and Sulfa antibiotics  Medications: Prior to Admission medications   Medication Sig Start Date End Date Taking? Authorizing Provider  acetaminophen (TYLENOL) 500 MG tablet Take 1,500 mg by mouth once.   Yes Historical Provider, MD  acyclovir (ZOVIRAX) 400 MG tablet Take 1 tablet (400 mg total) by mouth 2 (two) times daily. 04/27/14  Yes [name], MD  calcium carbonate (TUMS EX) 750 MG chewable tablet Chew 1 tablet by mouth daily.  08/04/14  Yes Historical Provider, MD  carvedilol (COREG) 6.25 MG tablet Take 3.125 mg by mouth 2 (two) times daily with a meal.   Yes Historical Provider, MD  cholecalciferol (VITAMIN D) 1000 UNITS tablet Take 1,000 Units by mouth daily.   Yes Historical Provider, MD  docusate sodium (COLACE) 100 MG  capsule Take 100 mg by mouth 2 (two) times daily.   Yes Historical Provider, MD  lenalidomide (REVLIMID) 25 MG capsule Take 1 capsule (25 mg total) by mouth daily. 10/13/14  Yes [name], MD  loperamide (IMODIUM A-D) 2 MG tablet Take 2 mg by mouth 4 (four) times daily as needed for diarrhea or loose stools.   Yes Historical Provider, MD  mirtazapine (REMERON) 30 MG tablet TAKE 1 TABLET BY MOUTH AT BEDTIME Patient taking differently: TAKE 1/2 TABLET BY MOUTH AT BEDTIME 08/28/14  Yes [name], MD  Multiple Vitamin (MULTIVITAMIN WITH MINERALS) TABS tablet Take 1 tablet by mouth daily.   Yes Historical Provider, MD  ondansetron (ZOFRAN) 8 MG tablet Take 1 tablet (8 mg total) by mouth 2 (two) times daily. Start the day after chemo for 2 days. Then as needed for nausea or vomiting. 10/15/14  Yes [name], MD  phosphorus (K PHOS NEUTRAL) 155-852-130 MG tablet Take 250 mg by mouth daily.  08/07/14 08/07/15 Yes Historical Provider, MD  potassium chloride SA (K-DUR,KLOR-CON) 20 MEQ tablet Take 20 mEq by mouth daily.  08/07/14 08/07/15 Yes Historical Provider, MD  triamcinolone (NASACORT) 55 MCG/ACT AERO nasal inhaler Place 2 sprays into the nose daily. 10/20/14  Yes [name], MD  lidocaine-prilocaine (EMLA) cream Apply to affected area once Patient not taking: Reported on 10/22/2014 10/15/14  [name], MD  lidocaine-prilocaine (EMLA) cream Apply 1 application topically as needed. Patient not taking: Reported on 10/22/2014 10/20/14   [name], MD  ondansetron (ZOFRAN) 8 MG tablet Take 1 tablet (8 mg total) by mouth every 8 (eight) hours as needed for nausea. Patient not taking: Reported on 10/22/2014 10/20/14   [name], MD  prochlorperazine (COMPAZINE) 10 MG tablet Take 1 tablet (10 mg total) by mouth every 6 (six) hours as needed (Nausea or vomiting). Patient not taking: Reported on 10/22/2014 10/15/14   [name], MD     Family History  Problem Relation Age of Onset  . Heart disease Mother   . Heart  failure Father   . Heart disease Father   . Hypertension Brother   . Heart disease Brother     congenital shunt, ?   . Colon cancer Paternal Aunt 7  . Throat cancer Father   . Skin cancer Father   . Skin cancer Brother   . Diabetes Father   . Kidney disease Father     History   Social History  . Marital Status: Divorced    Spouse Name: N/A  . Number of Children: N/A  . Years of Education: N/A   Social History Main Topics  . Smoking status: Former Smoker -- 0.50 packs/day for 4 years    Quit date: 06/26/1968  . Smokeless tobacco: Never Used  . Alcohol Use: 0.6 oz/week    1 Cans of beer per week     Comment: minimal  . Drug Use: No  . Sexual Activity: Not on file   Other Topics Concern  . None   Social History Narrative   HCPOA is daughter ajahnae, rathgeber will,  full code ( reviewed 2015)   Review of Systems: A 12 point ROS discussed and pertinent positives are indicated in the HPI above.  All other systems are negative.  Review of Systems  Vital Signs: Ht '5\' 2"'  (1.575 m)  Wt 155 lb (70.308 kg)  BMI 28.34 kg/m2 T: 98.10F, BP: 147/66 mmHg, HR: 72bpm  Physical Exam  Constitutional: She is oriented to person, place, and time. No distress.  HENT:  Head: Normocephalic and atraumatic.  Neck: No tracheal deviation present.  Cardiovascular: Normal rate and regular rhythm.  Exam reveals no gallop and no friction rub.   No murmur heard. Pulmonary/Chest: Effort normal and breath sounds normal. No respiratory distress. She has no wheezes. She has no rales.  Abdominal: Soft. Bowel sounds are normal. She exhibits no distension. There is no tenderness.  Neurological: She is alert and oriented to person, place, and time.  Skin: Skin is warm and dry. She is not diaphoretic.  Psychiatric: She has a normal mood and affect. Her behavior is normal. Thought content normal.    Mallampati Score:  MD Evaluation Airway: WNL Heart: WNL Abdomen: WNL Chest/ Lungs: WNL ASA   Classification: 3 Mallampati/Airway Score: Two  Imaging: No results found.  Labs:  CBC:  Recent Labs  08/25/14 0931 09/01/14 0958 10/13/14 1231 10/22/14 0750  WBC 8.5 5.7 4.9 10.9*  HGB 12.2 11.9 12.2 11.6*  HCT 36.7 36.2 36.5 35.8*  PLT 142* 121* 139* 96*    COAGS:  Recent Labs  04/23/14 1208 10/22/14 0750  INR 1.12 1.08  APTT 31 47*    BMP:  Recent Labs  10/28/13 0955  02/03/14 0909  04/23/14 1208  08/25/14 0932 09/01/14 0958 10/13/14 1231 10/22/14 0750  NA 138  < > 141  < >  140  < > 141 140 141 139  K 3.9  < > 3.8  < > 3.9  < > 4.1 4.5 4.4 4.0  CL 99  --  101  --  103  --   --   --   --  109  CO2 28  < > 25  < > 26  < > '28 29 24 23  ' GLUCOSE 100*  < > 108*  < > 81  < > 97 76 89 113*  BUN 11  < > 18  < > 16  < > 8.0 10.7 10.2 13  CALCIUM 9.3  < > 9.2  < > 8.4  < > 9.1 9.0 9.1 8.8  CREATININE 0.8  < > 0.68  < > 0.69  < > 0.7 0.7 0.6 0.68  GFRNONAA  --   --   --   --  73*  --   --   --   --  88*  GFRAA  --   --   --   --  >90  --   --   --   --  >90  < > = values in this interval not displayed.  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 Alvy Bimler on 10/15/14 Scheduled today for image guided port a catheter with moderate sedation The patient has been NPO, no blood thinners taken, labs and vitals have been reviewed. Risks and Benefits discussed with the patient including, but not limited to bleeding, infection, pneumothorax, or fibrin sheath development and need for additional procedures. All of the patient's questions were  answered, patient is agreeable to proceed. Consent signed and in chart.    SignedHedy Jacob 10/22/2014, 8:35 AM

## 2014-10-23 ENCOUNTER — Ambulatory Visit (HOSPITAL_BASED_OUTPATIENT_CLINIC_OR_DEPARTMENT_OTHER): Payer: Medicare HMO | Admitting: Nurse Practitioner

## 2014-10-23 ENCOUNTER — Encounter: Payer: Self-pay | Admitting: Nurse Practitioner

## 2014-10-23 ENCOUNTER — Telehealth: Payer: Self-pay | Admitting: *Deleted

## 2014-10-23 VITALS — BP 151/74 | HR 101 | Temp 99.5°F | Resp 18 | Wt 156.8 lb

## 2014-10-23 DIAGNOSIS — H109 Unspecified conjunctivitis: Secondary | ICD-10-CM

## 2014-10-23 DIAGNOSIS — C9000 Multiple myeloma not having achieved remission: Secondary | ICD-10-CM

## 2014-10-23 DIAGNOSIS — J329 Chronic sinusitis, unspecified: Secondary | ICD-10-CM | POA: Insufficient documentation

## 2014-10-23 DIAGNOSIS — J019 Acute sinusitis, unspecified: Secondary | ICD-10-CM

## 2014-10-23 MED ORDER — ERYTHROMYCIN 5 MG/GM OP OINT: 1.0000 "application " | TOPICAL_OINTMENT | Freq: Four times a day (QID) | OPHTHALMIC | Status: DC

## 2014-10-23 MED ORDER — LEVOFLOXACIN 500 MG PO TABS: 500.0000 mg | ORAL_TABLET | Freq: Every day | ORAL | Status: DC

## 2014-10-23 NOTE — Assessment & Plan Note (Signed)
Patient has what appears to be bilateral conjunctivitis; with sclera red, conjunctiva with erythema and mild edema.  Also has dried discharge to corners of both eyes.  Patient denies any vision changes whatsoever.  Patient will be prescribed erythromycin ophthalmic ointment to use as directed.  Patient was advised to call/return or go directly to the emergency department for worsening symptoms whatsoever.

## 2014-10-23 NOTE — Telephone Encounter (Signed)
Patient called and stated,"I've got a sinus infection and/or cold. My eyes are draining, productive cough, fever of 102.0 last night but I took Tylenol and now it's 100.3. I start chemotherapy next week, Tuesday, 5/3, and I want to be able to get it." Patient to come in this morning to see Selena Lesser, NP, for [facility]. POF sent to scheduling.

## 2014-10-23 NOTE — Assessment & Plan Note (Signed)
Patient received her last Kyprolis chemotherapy on 10/21/2014.  She also continues to take Revlimid oral therapy as directed.  Patient is scheduled to return on 10/27/2014 for labs and her next chemotherapy.  Also, patient obtained a new right upper chest Port-A-Cath just yesterday 10/22/2014.

## 2014-10-23 NOTE — Assessment & Plan Note (Signed)
Patient is complaining of URI symptoms which include significant nasal congestion, occasional sore throat, headache, nonproductive cough, and fever to maximum of 11.4 within the past 24 hours.  She has taken Tylenol for her fever.  On exam-patient does have significant nasal congestion; but no facial tenderness with palpation.  Posterior oropharynx with no exudate.  Bilateral eyes with what appears to be conjunctivitis.  Breath sounds clear in all lung fields.  Temperature while at the cancer center was 99.5.  Patient is allergic to penicillin and sulfa.  Patient will be prescribed Levaquin antibiotics for treatment of sinusitis.  Patient was advised to call/return or go directly to the emergency department for worsening symptoms whatsoever.

## 2014-10-23 NOTE — Progress Notes (Signed)
SYMPTOM MANAGEMENT CLINIC   HPI: Jean Davidson 69 y.o. female diagnosed with multiple myeloma.  Currently undergoing Kyprolis therapy; as well as Revlimid oral therapy.  Patient is complaining of URI symptoms which include significant nasal congestion, occasional sore throat, headache, nonproductive cough, and fever to maximum of 11.4 within the past 24 hours.  She has taken Tylenol for her fever.  Patient is also noted some bilateral eye sensitivity, increased tearing, and discharge.  HPI  ROS  Past Medical History  Diagnosis Date  . Cervical disc disease     c7  . Spinal stenosis in cervical region   . Carpal tunnel syndrome   . Pinched nerve   . Varicose veins of lower extremities with inflammation   . Disc degeneration   . Hypertension     Borderline.  . Carotid stenosis     Mild bilateral  . Anemia   . Anxiety   . GERD (gastroesophageal reflux disease)   . Cancer     Past Surgical History  Procedure Laterality Date  . Colonoscopy    . Bladder tact  1988/1989    x2  . Shoulder surgery Right 04/06/13    right  . Port-a-cath removal      has Palpitations; Hypertension; Carotid stenosis; Cervical disc disease; GERD (gastroesophageal reflux disease); Insomnia; Osteopenia; Hypersensitivity; Other malaise and fatigue; Normocytic anemia; Abdominal bloating; Persistent cough for 3 weeks or longer; Abnormal weight loss; Anemia in neoplastic disease; Thrombocytopenia due to drugs; Multiple myeloma; Constipation; Rash; Back pain; Anxiety; Diarrhea; Leukocytosis, unspecified; Neuropathy due to chemotherapeutic drug; Need for prophylactic vaccination and inoculation against influenza; Leukopenia due to antineoplastic chemotherapy; Bone marrow transplant status; Sinusitis; and Conjunctivitis on her problem list.    is allergic to penicillins and sulfa antibiotics.    Medication List       This list is accurate as of: 10/23/14  6:19 PM.  Always use your most recent med  list.               acetaminophen 500 MG tablet  Commonly known as:  TYLENOL  Take 1,500 mg by mouth once.     acyclovir 400 MG tablet  Commonly known as:  ZOVIRAX  Take 1 tablet (400 mg total) by mouth 2 (two) times daily.     calcium carbonate 750 MG chewable tablet  Commonly known as:  TUMS EX  Chew 1 tablet by mouth daily.     carvedilol 6.25 MG tablet  Commonly known as:  COREG  Take 3.125 mg by mouth 2 (two) times daily with a meal.     cholecalciferol 1000 UNITS tablet  Commonly known as:  VITAMIN D  Take 1,000 Units by mouth daily.     docusate sodium 100 MG capsule  Commonly known as:  COLACE  Take 100 mg by mouth 2 (two) times daily.     erythromycin ophthalmic ointment  Commonly known as:  ROMYCIN  Place 1 application into both eyes 4 (four) times daily.     lenalidomide 25 MG capsule  Commonly known as:  REVLIMID  Take 1 capsule (25 mg total) by mouth daily.     levofloxacin 500 MG tablet  Commonly known as:  LEVAQUIN  Take 1 tablet (500 mg total) by mouth daily.     loperamide 2 MG tablet  Commonly known as:  IMODIUM A-D  Take 2 mg by mouth 4 (four) times daily as needed for diarrhea or loose stools.     mirtazapine 30  MG tablet  Commonly known as:  REMERON  TAKE 1 TABLET BY MOUTH AT BEDTIME     multivitamin with minerals Tabs tablet  Take 1 tablet by mouth daily.     ondansetron 8 MG tablet  Commonly known as:  ZOFRAN  Take 1 tablet (8 mg total) by mouth 2 (two) times daily. Start the day after chemo for 2 days. Then as needed for nausea or vomiting.     phosphorus 155-852-130 MG tablet  Commonly known as:  K PHOS NEUTRAL  Take 250 mg by mouth daily.     potassium chloride SA 20 MEQ tablet  Commonly known as:  K-DUR,KLOR-CON  Take 20 mEq by mouth daily.     triamcinolone 55 MCG/ACT Aero nasal inhaler  Commonly known as:  NASACORT  Place 2 sprays into the nose daily.         PHYSICAL EXAMINATION  Oncology Vitals 10/23/2014  10/22/2014 10/22/2014 10/22/2014 10/22/2014 10/22/2014 10/22/2014  Height - - - - - - -  Weight 71.124 kg - - - - - -  Weight (lbs) 156 lbs 13 oz - - - - - -  BMI (kg/m2) - - - - - - -  Temp 99.5 97.8 - 97.9 - - -  Pulse 101 77 76 78 77 76 75  Resp _0 SpO2 100 100 97 97 99 99 98  BSA (m2) - - - - - - -   BP Readings from Last 3 Encounters:  10/23/14 151/74  10/21/14 131/57  10/20/14 124/66    Physical Exam  Constitutional: She is oriented to person, place, and time and well-developed, well-nourished, and in no distress.  HENT:  Head: Normocephalic and atraumatic.  Mouth/Throat: Oropharynx is clear and moist.  Patient has significant nasal congestion on exam.  No facial tenderness with palpation.  Oropharynx clear with no exudate.  Eyes: EOM are normal. Pupils are equal, round, and reactive to light. Right eye exhibits discharge. Left eye exhibits discharge. No scleral icterus.  Patient with dried discharge to both eyes.  Also, bilateral conjunctiva and sclera with erythema.  Neck: Normal range of motion. Neck supple. No JVD present. No tracheal deviation present. No thyromegaly present.  Cardiovascular: Normal rate, regular rhythm, normal heart sounds and intact distal pulses.   Pulmonary/Chest: Effort normal and breath sounds normal. No respiratory distress. She has no wheezes. She has no rales. She exhibits no tenderness.  Abdominal: Soft. Bowel sounds are normal. She exhibits no distension and no mass. There is no tenderness. There is no rebound and no guarding.  Musculoskeletal: Normal range of motion. She exhibits no edema or tenderness.  Lymphadenopathy:    She has no cervical adenopathy.  Neurological: She is alert and oriented to person, place, and time. Gait normal.  Skin: Skin is warm and dry. No rash noted. No erythema. No pallor.  Psychiatric: Affect normal.  Nursing note and vitals reviewed.   LABORATORY DATA:. Hospital Outpatient Visit on 10/22/2014    Component Date Value Ref Range Status  . aPTT 10/22/2014 47* 24 - 37 seconds Final   Comment:        IF BASELINE aPTT IS ELEVATED, SUGGEST PATIENT RISK ASSESSMENT BE USED TO DETERMINE APPROPRIATE ANTICOAGULANT THERAPY.   . Sodium 10/22/2014 139  135 - 145 mmol/L Final  . Potassium 10/22/2014 4.0  3.5 - 5.1 mmol/L Final  . Chloride 10/22/2014 109  96 - 112 mmol/L Final  . CO2 10/22/2014 23  19 - 32 mmol/L Final  . Glucose, Bld 10/22/2014 113* 70 - 99 mg/dL Final  . BUN 10/22/2014 13  6 - 23 mg/dL Final  . Creatinine, Ser 10/22/2014 0.68  0.50 - 1.10 mg/dL Final  . Calcium 10/22/2014 8.8  8.4 - 10.5 mg/dL Final  . GFR calc non Af Amer 10/22/2014 88* >90 mL/min Final  . GFR calc Af Amer 10/22/2014 >90  >90 mL/min Final   Comment: (NOTE) The eGFR has been calculated using the CKD EPI equation. This calculation has not been validated in all clinical situations. eGFR's persistently <90 mL/min signify possible Chronic Kidney Disease.   . Anion gap 10/22/2014 7  5 - 15 Final  . WBC 10/22/2014 10.9* 4.0 - 10.5 K/uL Final  . RBC 10/22/2014 3.66* 3.87 - 5.11 MIL/uL Final  . Hemoglobin 10/22/2014 11.6* 12.0 - 15.0 g/dL Final  . HCT 10/22/2014 35.8* 36.0 - 46.0 % Final  . MCV 10/22/2014 97.8  78.0 - 100.0 fL Final  . MCH 10/22/2014 31.7  26.0 - 34.0 pg Final  . MCHC 10/22/2014 32.4  30.0 - 36.0 g/dL Final  . RDW 10/22/2014 13.8  11.5 - 15.5 % Final  . Platelets 10/22/2014 96* 150 - 400 K/uL Final   Comment: SPECIMEN CHECKED FOR CLOTS PLATELET COUNT CONFIRMED BY SMEAR   . Prothrombin Time 10/22/2014 14.1  11.6 - 15.2 seconds Final  . INR 10/22/2014 1.08  0.00 - 1.49 Final     RADIOGRAPHIC STUDIES: Ir Fluoro Guide Cv Line Right  10/22/2014   INDICATION: History of multiple myeloma. Patient with history of prior Port a catheter placement on 04/23/2014 however the [facility] a Catheter was subsequently removed and replaced with a tunneled Hickman catheter as the patient underwent a stem cell  transplant performed at [facility]. Unfortunately, the patient has had disease recurrence and presents now for repeat Port a catheter placement.  EXAM: IMPLANTED PORT A CATH PLACEMENT WITH ULTRASOUND AND FLUOROSCOPIC GUIDANCE  COMPARISON:  Ultrasound fluoroscopic guided Port a catheter placement - 04/23/2014  MEDICATIONS: Vancomycin 1 gm IV; The antibiotic was administered within an appropriate time interval prior to skin puncture.  ANESTHESIA/SEDATION: Versed 3 mg IV; Fentanyl 50 mcg IV;  Total Moderate Sedation Time  24  minutes.  CONTRAST:  None  FLUOROSCOPY TIME:  36 seconds (18 mGy).  COMPLICATIONS: None immediate  PROCEDURE: The procedure, risks, benefits, and alternatives were explained to the patient. Questions regarding the procedure were encouraged and answered. The patient understands and consents to the procedure.  The right neck and chest were prepped with chlorhexidine in a sterile fashion, and a sterile drape was applied covering the operative field. Maximum barrier sterile technique with sterile gowns and gloves were used for the procedure. A timeout was performed prior to the initiation of the procedure. Local anesthesia was provided with 1% lidocaine with epinephrine.  After creating a small venotomy incision, a micropuncture kit was utilized to access the internal jugular vein under direct, real-time ultrasound guidance. Ultrasound image documentation was performed. The microwire was kinked to measure appropriate catheter length.  A subcutaneous port pocket was then created along the upper chest wall utilizing a combination of sharp and blunt dissection. The pocket was irrigated with sterile saline. A single lumen ISP power injectable port was chosen for placement. The 8 Fr catheter was tunneled from the port pocket site to the venotomy incision. The port was placed in the pocket. The external catheter was trimmed to appropriate length. At the venotomy, an 8 Fr  peel-away sheath was placed over a  guidewire under fluoroscopic guidance. The catheter was then placed through the sheath and the sheath was removed. Final catheter positioning was confirmed and documented with a fluoroscopic spot radiograph. The port was accessed with a Huber needle, aspirated and flushed with heparinized saline.  The venotomy site was closed with an interrupted 4-0 Vicryl suture. The port pocket incision was closed with interrupted 2-0 Vicryl suture and the skin was opposed with a running subcuticular 4-0 Vicryl suture. Dermabond and Steri-strips were applied to both incisions. Dressings were placed. The patient tolerated the procedure well without immediate post procedural complication.  FINDINGS: After catheter placement, the tip lies within the superior cavoatrial junction. The catheter aspirates and flushes normally and is ready for immediate use.  IMPRESSION: Successful placement of a right internal jugular approach power injectable Port-A-Cath. The catheter is ready for immediate use.   Electronically Signed   By: Sandi Mariscal M.D.   On: 10/22/2014 11:00   Ir US Guide Vasc Access Right  10/22/2014   INDICATION: History of multiple myeloma. Patient with history of prior Port a catheter placement on 04/23/2014 however the [facility] a Catheter was subsequently removed and replaced with a tunneled Hickman catheter as the patient underwent a stem cell transplant performed at [facility]. Unfortunately, the patient has had disease recurrence and presents now for repeat Port a catheter placement.  EXAM: IMPLANTED PORT A CATH PLACEMENT WITH ULTRASOUND AND FLUOROSCOPIC GUIDANCE  COMPARISON:  Ultrasound fluoroscopic guided Port a catheter placement - 04/23/2014  MEDICATIONS: Vancomycin 1 gm IV; The antibiotic was administered within an appropriate time interval prior to skin puncture.  ANESTHESIA/SEDATION: Versed 3 mg IV; Fentanyl 50 mcg IV;  Total Moderate Sedation Time  24  minutes.  CONTRAST:  None  FLUOROSCOPY TIME:  36 seconds (18 mGy).   COMPLICATIONS: None immediate  PROCEDURE: The procedure, risks, benefits, and alternatives were explained to the patient. Questions regarding the procedure were encouraged and answered. The patient understands and consents to the procedure.  The right neck and chest were prepped with chlorhexidine in a sterile fashion, and a sterile drape was applied covering the operative field. Maximum barrier sterile technique with sterile gowns and gloves were used for the procedure. A timeout was performed prior to the initiation of the procedure. Local anesthesia was provided with 1% lidocaine with epinephrine.  After creating a small venotomy incision, a micropuncture kit was utilized to access the internal jugular vein under direct, real-time ultrasound guidance. Ultrasound image documentation was performed. The microwire was kinked to measure appropriate catheter length.  A subcutaneous port pocket was then created along the upper chest wall utilizing a combination of sharp and blunt dissection. The pocket was irrigated with sterile saline. A single lumen ISP power injectable port was chosen for placement. The 8 Fr catheter was tunneled from the port pocket site to the venotomy incision. The port was placed in the pocket. The external catheter was trimmed to appropriate length. At the venotomy, an 8 Fr peel-away sheath was placed over a guidewire under fluoroscopic guidance. The catheter was then placed through the sheath and the sheath was removed. Final catheter positioning was confirmed and documented with a fluoroscopic spot radiograph. The port was accessed with a Huber needle, aspirated and flushed with heparinized saline.  The venotomy site was closed with an interrupted 4-0 Vicryl suture. The port pocket incision was closed with interrupted 2-0 Vicryl suture and the skin was opposed with a running subcuticular 4-0 Vicryl suture.  Dermabond and Steri-strips were applied to both incisions. Dressings were placed. The  patient tolerated the procedure well without immediate post procedural complication.  FINDINGS: After catheter placement, the tip lies within the superior cavoatrial junction. The catheter aspirates and flushes normally and is ready for immediate use.  IMPRESSION: Successful placement of a right internal jugular approach power injectable Port-A-Cath. The catheter is ready for immediate use.   Electronically Signed   By: Sandi Mariscal M.D.   On: 10/22/2014 11:00    ASSESSMENT/PLAN:    Multiple myeloma Patient received her last Kyprolis chemotherapy on 10/21/2014.  She also continues to take Revlimid oral therapy as directed.  Patient is scheduled to return on 10/27/2014 for labs and her next chemotherapy.  Also, patient obtained a new right upper chest Port-A-Cath just yesterday 10/22/2014.   Sinusitis Patient is complaining of URI symptoms which include significant nasal congestion, occasional sore throat, headache, nonproductive cough, and fever to maximum of 11.4 within the past 24 hours.  She has taken Tylenol for her fever.  On exam-patient does have significant nasal congestion; but no facial tenderness with palpation.  Posterior oropharynx with no exudate.  Bilateral eyes with what appears to be conjunctivitis.  Breath sounds clear in all lung fields.  Temperature while at the cancer center was 99.5.  Patient is allergic to penicillin and sulfa.  Patient will be prescribed Levaquin antibiotics for treatment of sinusitis.  Patient was advised to call/return or go directly to the emergency department for worsening symptoms whatsoever.   Conjunctivitis Patient has what appears to be bilateral conjunctivitis; with sclera red, conjunctiva with erythema and mild edema.  Also has dried discharge to corners of both eyes.  Patient denies any vision changes whatsoever.  Patient will be prescribed erythromycin ophthalmic ointment to use as directed.  Patient was advised to call/return or go  directly to the emergency department for worsening symptoms whatsoever.   Patient stated understanding of all instructions; and was in agreement with this plan of care. The patient knows to call the clinic with any problems, questions or concerns.   Review/collaboration with Dr. Alvy Bimler regarding all aspects of patient's visit today.   Total time spent with patient was 25 minutes;  with greater than 75 percent of that time spent in face to face counseling regarding patient's symptoms,  and coordination of care and follow up.  Disclaimer: This note was dictated with voice recognition software. Similar sounding words can inadvertently be transcribed and may not be corrected upon review.   Drue Second, NP 10/23/2014

## 2014-10-26 ENCOUNTER — Telehealth: Payer: Self-pay | Admitting: *Deleted

## 2014-10-26 ENCOUNTER — Other Ambulatory Visit: Payer: Self-pay | Admitting: Hematology and Oncology

## 2014-10-26 ENCOUNTER — Telehealth: Payer: Self-pay | Admitting: Hematology and Oncology

## 2014-10-26 DIAGNOSIS — H109 Unspecified conjunctivitis: Secondary | ICD-10-CM

## 2014-10-26 MED ORDER — TOBRAMYCIN-DEXAMETHASONE 0.3-0.1 % OP SUSP: 1.0000 [drp] | Freq: Four times a day (QID) | OPHTHALMIC | Status: DC

## 2014-10-26 MED ORDER — LOTEPREDNOL-TOBRAMYCIN 0.5-0.3 % OP SUSP: 1.0000 [drp] | Freq: Four times a day (QID) | OPHTHALMIC | Status: DC

## 2014-10-26 NOTE — Telephone Encounter (Signed)
[facility] states eye drops ordered by Dr. Alvy Bimler not on pt's insurance formulary.  Order changed to Tobradex and pharmacy notified,  They states this is covered by pt's insurance.

## 2014-10-26 NOTE — Telephone Encounter (Signed)
Lft msg for pt confirming labs/flush/ov/chemo for 05/03 and request that pt p/u updated schedule at next visit. Sent msg also through my chart to pt confirming visits for 05/03.Marland Kitchen... KJ

## 2014-10-26 NOTE — Telephone Encounter (Signed)
-----   Message from [name], MD sent at 10/26/2014  8:36 AM EDT ----- Can you check and see how she feels? If not better I can add on appt to see her before her chemo tomorrow ----- Message -----    From: Jean Borders, NP    Sent: 10/23/2014   6:23 PM      To: Jean Cower, RN, Jean Salles, RN, #  Patient is complaining of URI symptoms which include significant nasal congestion, occasional sore throat, headache, nonproductive cough, and fever to maximum of 11.4 within the past 24 hours.  She has taken Tylenol for her fever.  Patient also appears to have bilateral conjunctivitis.  Patient was prescribed Levaquin antibiotics and erythromycin ophthalmic ointment for her eyes.  Patient is scheduled for her next chemotherapy on 10/27/2014.  She does not have a follow-up visit appointment scheduled prior to chemotherapy at this time.  May want to check and make sure she is feeling better prior to her chemotherapy.

## 2014-10-26 NOTE — Telephone Encounter (Signed)
Informed pt of eye drops [facility] by Dr. Alvy Bimler.  Also instructed on new time to come in tomorrow at 8:30 am for lab/flush and will see MD at 9;15 am before chemo.  Pt verbalized understanding.

## 2014-10-26 NOTE — Telephone Encounter (Signed)
Pt states still congested and has productive cough.  She has not had a fever in 1 to 2 days.  She left VM for Triage asking for her antibiotic eye Ointment to be changed to eye Drops.  States she is having too much difficulty using the ointment and figuring out the correct amt to use.  She would prefer drops sent to her pharmacy.   Pt was audibly congested and coughing on phone.  Instructed her we will schedule her to see Dr. Alvy Bimler prior to her chemo tomorrow.  Pt agreed and states she does want to see Dr. Alvy Bimler tomorrow, she has some concerns about her "low platelet count" last week.

## 2014-10-26 NOTE — Telephone Encounter (Signed)
I e-scribed new eye drops I placed POF to move labs a bit earlier and see me tomorrow

## 2014-10-27 ENCOUNTER — Other Ambulatory Visit: Payer: Medicare HMO

## 2014-10-27 ENCOUNTER — Encounter: Payer: Self-pay | Admitting: Hematology and Oncology

## 2014-10-27 ENCOUNTER — Ambulatory Visit: Payer: Medicare HMO

## 2014-10-27 ENCOUNTER — Ambulatory Visit (HOSPITAL_BASED_OUTPATIENT_CLINIC_OR_DEPARTMENT_OTHER): Payer: Medicare HMO | Admitting: Hematology and Oncology

## 2014-10-27 ENCOUNTER — Ambulatory Visit (HOSPITAL_BASED_OUTPATIENT_CLINIC_OR_DEPARTMENT_OTHER): Payer: Medicare HMO

## 2014-10-27 ENCOUNTER — Other Ambulatory Visit (HOSPITAL_BASED_OUTPATIENT_CLINIC_OR_DEPARTMENT_OTHER): Payer: Medicare HMO

## 2014-10-27 VITALS — BP 135/67 | HR 69 | Temp 98.2°F | Resp 18 | Ht 62.0 in | Wt 153.6 lb

## 2014-10-27 DIAGNOSIS — C9000 Multiple myeloma not having achieved remission: Secondary | ICD-10-CM

## 2014-10-27 DIAGNOSIS — Z5112 Encounter for antineoplastic immunotherapy: Secondary | ICD-10-CM

## 2014-10-27 DIAGNOSIS — H109 Unspecified conjunctivitis: Secondary | ICD-10-CM | POA: Diagnosis not present

## 2014-10-27 DIAGNOSIS — Z95828 Presence of other vascular implants and grafts: Secondary | ICD-10-CM

## 2014-10-27 DIAGNOSIS — D6959 Other secondary thrombocytopenia: Secondary | ICD-10-CM | POA: Diagnosis not present

## 2014-10-27 DIAGNOSIS — J324 Chronic pansinusitis: Secondary | ICD-10-CM | POA: Diagnosis not present

## 2014-10-27 DIAGNOSIS — T50905A Adverse effect of unspecified drugs, medicaments and biological substances, initial encounter: Secondary | ICD-10-CM

## 2014-10-27 LAB — COMPREHENSIVE METABOLIC PANEL (CC13)
ALT: 10 U/L (ref 0–55)
AST: 11 U/L (ref 5–34)
Albumin: 3.5 g/dL (ref 3.5–5.0)
Alkaline Phosphatase: 46 U/L (ref 40–150)
Anion Gap: 6 mEq/L (ref 3–11)
BUN: 9.4 mg/dL (ref 7.0–26.0)
CO2: 27 mEq/L (ref 22–29)
Calcium: 8.5 mg/dL (ref 8.4–10.4)
Chloride: 107 mEq/L (ref 98–109)
Creatinine: 0.6 mg/dL (ref 0.6–1.1)
EGFR: >90 mL/min/{1.73_m2} (ref >90)
Glucose: 88 mg/dl (ref 70–140)
Potassium: 4.3 mEq/L (ref 3.5–5.1)
Sodium: 139 mEq/L (ref 136–145)
Total Bilirubin: 0.44 mg/dL (ref 0.20–1.20)
Total Protein: 6.2 g/dL — ABNORMAL LOW (ref 6.4–8.3)

## 2014-10-27 LAB — CBC WITH DIFFERENTIAL/PLATELET
BASO%: 0.2 % (ref 0.0–2.0)
Basophils Absolute: 0 10*3/uL (ref 0.0–0.1)
EOS%: 1.1 % (ref 0.0–7.0)
Eosinophils Absolute: 0.1 10*3/uL (ref 0.0–0.5)
HCT: 35.3 % (ref 34.8–46.6)
HGB: 11.8 g/dL (ref 11.6–15.9)
LYMPH%: 16.8 % (ref 14.0–49.7)
MCH: 31.7 pg (ref 25.1–34.0)
MCHC: 33.4 g/dL (ref 31.5–36.0)
MCV: 94.9 fL (ref 79.5–101.0)
MONO#: 1.2 10*3/uL — ABNORMAL HIGH (ref 0.1–0.9)
MONO%: 22.7 % — ABNORMAL HIGH (ref 0.0–14.0)
NEUT#: 3.2 10*3/uL (ref 1.5–6.5)
NEUT%: 59.2 % (ref 38.4–76.8)
Platelets: 122 10*3/uL — ABNORMAL LOW (ref 145–400)
RBC: 3.72 10*6/uL (ref 3.70–5.45)
RDW: 13.7 % (ref 11.2–14.5)
WBC: 5.4 10*3/uL (ref 3.9–10.3)
lymph#: 0.9 10*3/uL (ref 0.9–3.3)
nRBC: 0 % (ref 0–0)

## 2014-10-27 LAB — MAGNESIUM (CC13): Magnesium: 2.8 mg/dl — ABNORMAL HIGH (ref 1.5–2.5)

## 2014-10-27 MED ORDER — SODIUM CHLORIDE 0.9 % IJ SOLN
10.0000 mL | INTRAMUSCULAR | Status: DC | PRN
  Administered 2014-10-27: 10 mL via INTRAVENOUS
  Filled 2014-10-27: qty 10

## 2014-10-27 MED ORDER — SODIUM CHLORIDE 0.9 % IV SOLN
Freq: Once | INTRAVENOUS | Status: AC
  Administered 2014-10-27: 11:00:00 via INTRAVENOUS

## 2014-10-27 MED ORDER — DEXAMETHASONE SODIUM PHOSPHATE 100 MG/10ML IJ SOLN
Freq: Once | INTRAMUSCULAR | Status: AC
  Administered 2014-10-27: 10:00:00 via INTRAVENOUS
  Filled 2014-10-27: qty 4

## 2014-10-27 MED ORDER — DEXTROSE 5 % IV SOLN
20.0000 mg/m2 | Freq: Once | INTRAVENOUS | Status: AC
  Administered 2014-10-27: 36 mg via INTRAVENOUS
  Filled 2014-10-27: qty 18

## 2014-10-27 MED ORDER — SODIUM CHLORIDE 0.9 % IJ SOLN
10.0000 mL | INTRAMUSCULAR | Status: DC | PRN
  Administered 2014-10-27: 10 mL
  Filled 2014-10-27: qty 10

## 2014-10-27 MED ORDER — SODIUM CHLORIDE 0.9 % IV SOLN
Freq: Once | INTRAVENOUS | Status: AC
  Administered 2014-10-27: 10:00:00 via INTRAVENOUS

## 2014-10-27 MED ORDER — HEPARIN SOD (PORK) LOCK FLUSH 100 UNIT/ML IV SOLN
500.0000 [IU] | Freq: Once | INTRAVENOUS | Status: AC | PRN
  Administered 2014-10-27: 500 [IU]
  Filled 2014-10-27: qty 5

## 2014-10-27 NOTE — Patient Instructions (Signed)
Cancer Center Discharge Instructions for Patients Receiving Chemotherapy  Today you received the following chemotherapy agents: Kyprolis  To help prevent nausea and vomiting after your treatment, we encourage you to take your nausea medication as prescribed by your physician.   If you develop nausea and vomiting that is not controlled by your nausea medication, call the clinic.   BELOW ARE SYMPTOMS THAT SHOULD BE REPORTED IMMEDIATELY:  *FEVER GREATER THAN 100.5 F  *CHILLS WITH OR WITHOUT FEVER  NAUSEA AND VOMITING THAT IS NOT CONTROLLED WITH YOUR NAUSEA MEDICATION  *UNUSUAL SHORTNESS OF BREATH  *UNUSUAL BRUISING OR BLEEDING  TENDERNESS IN MOUTH AND THROAT WITH OR WITHOUT PRESENCE OF ULCERS  *URINARY PROBLEMS  *BOWEL PROBLEMS  UNUSUAL RASH Items with * indicate a potential emergency and should be followed up as soon as possible.  Feel free to call the clinic you have any questions or concerns. The clinic phone number is [phone].  Please show the CHEMO ALERT CARD at check-in to the Emergency Department and triage nurse.   

## 2014-10-27 NOTE — Patient Instructions (Signed)

## 2014-10-28 ENCOUNTER — Ambulatory Visit (HOSPITAL_BASED_OUTPATIENT_CLINIC_OR_DEPARTMENT_OTHER): Payer: Medicare HMO

## 2014-10-28 VITALS — BP 127/76 | HR 69 | Temp 98.3°F | Resp 20

## 2014-10-28 DIAGNOSIS — Z5112 Encounter for antineoplastic immunotherapy: Secondary | ICD-10-CM | POA: Diagnosis not present

## 2014-10-28 DIAGNOSIS — C9000 Multiple myeloma not having achieved remission: Secondary | ICD-10-CM

## 2014-10-28 MED ORDER — HEPARIN SOD (PORK) LOCK FLUSH 100 UNIT/ML IV SOLN
500.0000 [IU] | Freq: Once | INTRAVENOUS | Status: AC | PRN
  Administered 2014-10-28: 500 [IU]
  Filled 2014-10-28: qty 5

## 2014-10-28 MED ORDER — SODIUM CHLORIDE 0.9 % IV SOLN
Freq: Once | INTRAVENOUS | Status: AC
  Administered 2014-10-28: 08:00:00 via INTRAVENOUS

## 2014-10-28 MED ORDER — DEXTROSE 5 % IV SOLN
20.0000 mg/m2 | Freq: Once | INTRAVENOUS | Status: AC
  Administered 2014-10-28: 36 mg via INTRAVENOUS
  Filled 2014-10-28: qty 18

## 2014-10-28 MED ORDER — SODIUM CHLORIDE 0.9 % IV SOLN
Freq: Once | INTRAVENOUS | Status: AC
  Administered 2014-10-28: 09:00:00 via INTRAVENOUS
  Filled 2014-10-28: qty 4

## 2014-10-28 MED ORDER — SODIUM CHLORIDE 0.9 % IJ SOLN
10.0000 mL | INTRAMUSCULAR | Status: DC | PRN
  Administered 2014-10-28: 10 mL
  Filled 2014-10-28: qty 10

## 2014-10-28 NOTE — Assessment & Plan Note (Signed)
Her symptoms are improving with antibiotic treatment. I recommend she complete the course of treatment.

## 2014-10-28 NOTE — Progress Notes (Signed)
[facility] OFFICE PROGRESS NOTE  Patient Care Team: Jinny Sanders, MD as PCP - General (Family Medicine) [name], MD as Referring Physician (Hematology and Oncology) [name], MD as Consulting Physician (Internal Medicine)  SUMMARY OF ONCOLOGIC HISTORY: Oncology History   Multiple myeloma, Ig A Lambda, M spike 3.54 grams, Calcium 9.2, Creatinine 0.8, Beta 2 microglobulin 4.52, IgA 4840 mg/dL, lambda light chain 75.4, albumin 3.6, hemoglobin 9.7, platelet 115    Primary site: Multiple Myeloma   Staging method: AJCC 6th Edition   Clinical: Stage IIA signed by [name], MD on 11/07/2013  2:46 PM   Summary: Stage IIA        Multiple myeloma   10/31/2013 Bone Marrow Biopsy Bone marrow biopsy confirmed multiple myeloma with 40% bone marrow involvement. Skeletal survey showed minimal lesions in her score with generalized demineralization   11/10/2013 - 02/13/2014 Chemotherapy The patient is started on induction chemotherapy with weekly dexamethasone 40 mg by mouth as well as Velcade subcutaneous injection on days 1, 4, 8 and 11. On 11/21/2013, she was started on monthly Zometa.   12/23/2013 Adverse Reaction The dose of Velcade was reduced due to thrombocytopenia.   01/28/2014 - 04/07/2014 Chemotherapy Revlimid is added. Treatment was discontinued due to lack of response.   02/24/2014 - 04/07/2014 Chemotherapy Due to worsening peripheral neuropathy, Velcade injection is changed to once a week. Revlimid was given 21 days on, 7 days off.   04/07/2014 - 04/10/2014 Chemotherapy Revlimid was discontinued due to lack of response. Chemotherapy was changed back to Velcade injection twice a week, 2 weeks on 1 week off. Her treatment was switched to to minimum response   04/20/2014 - 06/02/2014 Chemotherapy chemotherapy is switched to Carfilzomib, Cytoxan and dexamethasone.   04/22/2014 Procedure she has placement of port for chemotherapy.   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 She was seen urgently because of unresolving symptoms. Last week, she was seen by NP last week for bilateral bacterial conjunctivitis, low-grade fever, lymphadenopathy and sore throat. She was prescribed topical antibiotic eyedrops and oral antibiotic therapy. She denies recurrent fevers or chills. Appetite stable. Denies recent bone pain.  REVIEW OF SYSTEMS:   Constitutional: Denies fevers, chills or abnormal weight loss Eyes: Denies blurriness of vision Respiratory: Denies cough, dyspnea or wheezes Cardiovascular: Denies palpitation, chest discomfort or lower extremity swelling Gastrointestinal:  Denies nausea, heartburn or change in bowel habits Skin: Denies abnormal skin rashes Lymphatics: Denies new lymphadenopathy or easy bruising Neurological:Denies numbness, tingling or new weaknesses Behavioral/Psych: Mood is stable, no new changes  All other systems were reviewed with the patient and are negative.  I have reviewed the past medical history, past surgical history, social history and family history with the patient and they are unchanged from previous note.  ALLERGIES:  is allergic to penicillins and sulfa antibiotics.  MEDICATIONS:  Current Outpatient Prescriptions  Medication Sig Dispense Refill  . acetaminophen (TYLENOL) 500 MG tablet Take 1,500 mg by mouth once.    Marland Kitchen acyclovir (ZOVIRAX) 400 MG  tablet Take 1 tablet (400 mg total) by mouth 2 (two) times daily. 180 tablet 3   . calcium carbonate (TUMS EX) 750 MG chewable tablet Chew 1 tablet by mouth daily.     . carvedilol (COREG) 6.25 MG tablet Take 3.125 mg by mouth 2 (two) times daily with a meal.    . cholecalciferol (VITAMIN D) 1000 UNITS tablet Take 1,000 Units by mouth daily.    Marland Kitchen docusate sodium (COLACE) 100 MG capsule Take 100 mg by mouth 2 (two) times daily.    Marland Kitchen lenalidomide (REVLIMID) 25 MG capsule Take 1 capsule (25 mg total) by mouth daily. 21 capsule 0  . levofloxacin (LEVAQUIN) 500 MG tablet Take 1 tablet (500 mg total) by mouth daily. 10 tablet 0  . loperamide (IMODIUM A-D) 2 MG tablet Take 2 mg by mouth 4 (four) times daily as needed for diarrhea or loose stools.    . mirtazapine (REMERON) 30 MG tablet TAKE 1 TABLET BY MOUTH AT BEDTIME (Patient taking differently: TAKE 1/2 TABLET BY MOUTH AT BEDTIME) 60 tablet 1  . Multiple Vitamin (MULTIVITAMIN WITH MINERALS) TABS tablet Take 1 tablet by mouth daily.    . ondansetron (ZOFRAN) 8 MG tablet Take 1 tablet (8 mg total) by mouth 2 (two) times daily. Start the day after chemo for 2 days. Then as needed for nausea or vomiting. 30 tablet 1  . phosphorus (K PHOS NEUTRAL) 155-852-130 MG tablet Take 250 mg by mouth daily.     . potassium chloride SA (K-DUR,KLOR-CON) 20 MEQ tablet Take 20 mEq by mouth daily.     Marland Kitchen tobramycin-dexamethasone (TOBRADEX) ophthalmic solution Place 1 drop into both eyes 4 (four) times daily. 10 mL 0  . triamcinolone (NASACORT) 55 MCG/ACT AERO nasal inhaler Place 2 sprays into the nose daily. 1 Inhaler 12   No current facility-administered medications for this visit.    PHYSICAL EXAMINATION: ECOG PERFORMANCE STATUS: 1 - Symptomatic but completely ambulatory  Filed Vitals:   10/27/14 0852  BP: 135/67  Pulse: 69  Temp: 98.2 F (36.8 C)  Resp: 18   Filed Weights   10/27/14 0852  Weight: 153 lb 9.6 oz (69.673 kg)    GENERAL:alert, no distress and comfortable SKIN: skin color, texture, turgor are normal, no rashes or  significant lesions EYES: normal, Conjunctiva are pink and non-injected, sclera clear. Conjunctivitis has resolved  OROPHARYNX:no exudate, no erythema and lips, buccal mucosa, and tongue normal  NECK: supple, thyroid normal size, non-tender, without nodularity LYMPH:  no palpable lymphadenopathy in the cervical, axillary or inguinal LUNGS: clear to auscultation and percussion with normal breathing effort HEART: regular rate & rhythm and no murmurs and no lower extremity edema ABDOMEN:abdomen soft, non-tender and normal bowel sounds Musculoskeletal:no cyanosis of digits and no clubbing  NEURO: alert & oriented x 3 with fluent speech, no focal motor/sensory deficits  LABORATORY DATA:  I have reviewed the data as listed    Component Value Date/Time   NA 139 10/27/2014 0832   NA 139 10/22/2014 0750   K 4.3 10/27/2014 0832   K 4.0 10/22/2014 0750   CL 109 10/22/2014 0750   CO2 27 10/27/2014 0832   CO2 23 10/22/2014 0750   GLUCOSE 88 10/27/2014 0832   GLUCOSE 113* 10/22/2014 0750   BUN 9.4 10/27/2014 0832   BUN 13 10/22/2014 0750   CREATININE 0.6 10/27/2014 0832   CREATININE 0.68 10/22/2014 0750   CALCIUM 8.5 10/27/2014 0832   CALCIUM 8.8 10/22/2014 0750   PROT 6.2* 10/27/2014  0832   PROT 8.5* 02/03/2014 0909   ALBUMIN 3.5 10/27/2014 0832   ALBUMIN 3.5 02/03/2014 0909   AST 11 10/27/2014 0832   AST 14 02/03/2014 0909   ALT 10 10/27/2014 0832   ALT 12 02/03/2014 0909   ALKPHOS 46 10/27/2014 0832   ALKPHOS 53 02/03/2014 0909   BILITOT 0.44 10/27/2014 0832   BILITOT <0.2* 02/03/2014 0909   GFRNONAA 88* 10/22/2014 0750   GFRAA >90 10/22/2014 0750    No results found for: SPEP, UPEP  Lab Results  Component Value Date   WBC 5.4 10/27/2014   NEUTROABS 3.2 10/27/2014   HGB 11.8 10/27/2014   HCT 35.3 10/27/2014   MCV 94.9 10/27/2014   PLT 122* 10/27/2014      Chemistry      Component Value Date/Time   NA 139 10/27/2014 0832   NA 139 10/22/2014 0750   K 4.3 10/27/2014  0832   K 4.0 10/22/2014 0750   CL 109 10/22/2014 0750   CO2 27 10/27/2014 0832   CO2 23 10/22/2014 0750   BUN 9.4 10/27/2014 0832   BUN 13 10/22/2014 0750   CREATININE 0.6 10/27/2014 0832   CREATININE 0.68 10/22/2014 0750      Component Value Date/Time   CALCIUM 8.5 10/27/2014 0832   CALCIUM 8.8 10/22/2014 0750   ALKPHOS 46 10/27/2014 0832   ALKPHOS 53 02/03/2014 0909   AST 11 10/27/2014 0832   AST 14 02/03/2014 0909   ALT 10 10/27/2014 0832   ALT 12 02/03/2014 0909   BILITOT 0.44 10/27/2014 0832   BILITOT <0.2* 02/03/2014 0909     ASSESSMENT & PLAN:  Multiple myeloma She is tolerating the treatment well with expected mild pancytopenia related to treatment. I recommend we continue the same treatment without dose adjustment.   Thrombocytopenia due to drugs This is likely due to recent treatment. The patient denies recent history of bleeding such as epistaxis, hematuria or hematochezia. She is asymptomatic from the low platelet count. I will observe for now.  she does not require transfusion now. I will continue the chemotherapy at current dose without dosage adjustment.  If the thrombocytopenia gets progressive worse in the future, I might have to delay her treatment or adjust the chemotherapy dose.   Sinusitis Her symptoms are improving with antibiotic treatment. I recommend she complete the course of treatment.   Conjunctivitis She was prescribed antibiotic eyedrops and this is much improved. Continue to same.    Orders Placed This Encounter  Procedures  . Beta 2 microglobulin, serum    Standing Status: Future     Number of Occurrences:      Standing Expiration Date: 12/01/2015  . Kappa/lambda light chains    Standing Status: Future     Number of Occurrences:      Standing Expiration Date: 12/01/2015  . SPEP & IFE with QIG    Standing Status: Future     Number of Occurrences:      Standing Expiration Date: 12/01/2015   All questions were answered. The patient  knows to call the clinic with any problems, questions or concerns. No barriers to learning was detected. I spent 25 minutes counseling the patient face to face. The total time spent in the appointment was 30 minutes and more than 50% was on counseling and review of test results     [facility] Edwards, Joseph Bias, MD 10/28/2014 1:25 PM

## 2014-10-28 NOTE — Assessment & Plan Note (Signed)
She is tolerating the treatment well with expected mild pancytopenia related to treatment. I recommend we continue the same treatment without dose adjustment.

## 2014-10-28 NOTE — Patient Instructions (Addendum)
[facility] Discharge Instructions for Patients Receiving Chemotherapy  Today you received the following chemotherapy agents Kyprolis  To help prevent nausea and vomiting after your treatment, we encourage you to take your nausea medication as prescribed by your physician: Zofran 8 mg twice daily as needed.   If you develop nausea and vomiting that is not controlled by your nausea medication, call the clinic.   BELOW ARE SYMPTOMS THAT SHOULD BE REPORTED IMMEDIATELY:  *FEVER GREATER THAN 100.5 F  *CHILLS WITH OR WITHOUT FEVER  NAUSEA AND VOMITING THAT IS NOT CONTROLLED WITH YOUR NAUSEA MEDICATION  *UNUSUAL SHORTNESS OF BREATH  *UNUSUAL BRUISING OR BLEEDING  TENDERNESS IN MOUTH AND THROAT WITH OR WITHOUT PRESENCE OF ULCERS  *URINARY PROBLEMS  *BOWEL PROBLEMS  UNUSUAL RASH Items with * indicate a potential emergency and should be followed up as soon as possible.  Feel free to call the clinic you have any questions or concerns. The clinic phone number is [phone].  Please show the [facility] at check-in to the Emergency Department and triage nurse.

## 2014-10-28 NOTE — Assessment & Plan Note (Signed)
This is likely due to recent treatment. The patient denies recent history of bleeding such as epistaxis, hematuria or hematochezia. She is asymptomatic from the low platelet count. I will observe for now.  she does not require transfusion now. I will continue the chemotherapy at current dose without dosage adjustment.  If the thrombocytopenia gets progressive worse in the future, I might have to delay her treatment or adjust the chemotherapy dose.   

## 2014-10-28 NOTE — Assessment & Plan Note (Signed)
She was prescribed antibiotic eyedrops and this is much improved. Continue to same.

## 2014-11-03 ENCOUNTER — Ambulatory Visit (HOSPITAL_BASED_OUTPATIENT_CLINIC_OR_DEPARTMENT_OTHER): Payer: Medicare HMO

## 2014-11-03 ENCOUNTER — Other Ambulatory Visit (HOSPITAL_BASED_OUTPATIENT_CLINIC_OR_DEPARTMENT_OTHER): Payer: Medicare HMO

## 2014-11-03 ENCOUNTER — Ambulatory Visit: Payer: Medicare HMO

## 2014-11-03 VITALS — BP 128/91 | HR 63 | Temp 98.7°F | Resp 18

## 2014-11-03 DIAGNOSIS — C9000 Multiple myeloma not having achieved remission: Secondary | ICD-10-CM | POA: Diagnosis not present

## 2014-11-03 DIAGNOSIS — Z5112 Encounter for antineoplastic immunotherapy: Secondary | ICD-10-CM

## 2014-11-03 DIAGNOSIS — Z95828 Presence of other vascular implants and grafts: Secondary | ICD-10-CM

## 2014-11-03 LAB — CBC WITH DIFFERENTIAL/PLATELET
BASO%: 0 % (ref 0.0–2.0)
Basophils Absolute: 0 10*3/uL (ref 0.0–0.1)
EOS%: 0.6 % (ref 0.0–7.0)
Eosinophils Absolute: 0 10*3/uL (ref 0.0–0.5)
HCT: 32.1 % — ABNORMAL LOW (ref 34.8–46.6)
HGB: 10.7 g/dL — ABNORMAL LOW (ref 11.6–15.9)
LYMPH%: 22.1 % (ref 14.0–49.7)
MCH: 31.7 pg (ref 25.1–34.0)
MCHC: 33.3 g/dL (ref 31.5–36.0)
MCV: 95 fL (ref 79.5–101.0)
MONO#: 1.1 10*3/uL — ABNORMAL HIGH (ref 0.1–0.9)
MONO%: 22.3 % — ABNORMAL HIGH (ref 0.0–14.0)
NEUT#: 2.7 10*3/uL (ref 1.5–6.5)
NEUT%: 55 % (ref 38.4–76.8)
Platelets: 82 10*3/uL — ABNORMAL LOW (ref 145–400)
RBC: 3.38 10*6/uL — ABNORMAL LOW (ref 3.70–5.45)
RDW: 13.4 % (ref 11.2–14.5)
WBC: 4.9 10*3/uL (ref 3.9–10.3)
lymph#: 1.1 10*3/uL (ref 0.9–3.3)

## 2014-11-03 LAB — COMPREHENSIVE METABOLIC PANEL (CC13)
ALT: 11 U/L (ref 0–55)
AST: 10 U/L (ref 5–34)
Albumin: 3.5 g/dL (ref 3.5–5.0)
Alkaline Phosphatase: 37 U/L — ABNORMAL LOW (ref 40–150)
Anion Gap: 6 mEq/L (ref 3–11)
BUN: 11.2 mg/dL (ref 7.0–26.0)
CO2: 25 mEq/L (ref 22–29)
Calcium: 8.1 mg/dL — ABNORMAL LOW (ref 8.4–10.4)
Chloride: 110 mEq/L — ABNORMAL HIGH (ref 98–109)
Creatinine: 0.6 mg/dL (ref 0.6–1.1)
EGFR: >90 mL/min/{1.73_m2} (ref >90)
Glucose: 74 mg/dl (ref 70–140)
Potassium: 4.3 mEq/L (ref 3.5–5.1)
Sodium: 141 mEq/L (ref 136–145)
Total Bilirubin: 0.39 mg/dL (ref 0.20–1.20)
Total Protein: 5.5 g/dL — ABNORMAL LOW (ref 6.4–8.3)

## 2014-11-03 LAB — TECHNOLOGIST REVIEW

## 2014-11-03 MED ORDER — SODIUM CHLORIDE 0.9 % IV SOLN
Freq: Once | INTRAVENOUS | Status: AC
  Administered 2014-11-03: 10:00:00 via INTRAVENOUS
  Filled 2014-11-03: qty 4

## 2014-11-03 MED ORDER — DEXTROSE 5 % IV SOLN
20.0000 mg/m2 | Freq: Once | INTRAVENOUS | Status: AC
  Administered 2014-11-03: 36 mg via INTRAVENOUS
  Filled 2014-11-03: qty 18

## 2014-11-03 MED ORDER — SODIUM CHLORIDE 0.9 % IJ SOLN
10.0000 mL | INTRAMUSCULAR | Status: DC | PRN
  Administered 2014-11-03: 10 mL
  Filled 2014-11-03: qty 10

## 2014-11-03 MED ORDER — SODIUM CHLORIDE 0.9 % IJ SOLN
10.0000 mL | INTRAMUSCULAR | Status: DC | PRN
  Administered 2014-11-03: 10 mL via INTRAVENOUS
  Filled 2014-11-03: qty 10

## 2014-11-03 MED ORDER — SODIUM CHLORIDE 0.9 % IV SOLN
Freq: Once | INTRAVENOUS | Status: AC
  Administered 2014-11-03: 10:00:00 via INTRAVENOUS

## 2014-11-03 MED ORDER — HEPARIN SOD (PORK) LOCK FLUSH 100 UNIT/ML IV SOLN
500.0000 [IU] | Freq: Once | INTRAVENOUS | Status: AC | PRN
  Administered 2014-11-03: 500 [IU]
  Filled 2014-11-03: qty 5

## 2014-11-03 NOTE — Patient Instructions (Signed)

## 2014-11-03 NOTE — Patient Instructions (Signed)
[facility] Discharge Instructions for Patients Receiving Chemotherapy  Today you received the following chemotherapy agents: Kyprolis.  To help prevent nausea and vomiting after your treatment, we encourage you to take your nausea medication: Zofran. Take one twice daily. Begin the morning of 11/04/14. Continue Revlimid as prescribed.   If you develop nausea and vomiting that is not controlled by your nausea medication, call the clinic.   BELOW ARE SYMPTOMS THAT SHOULD BE REPORTED IMMEDIATELY:  *FEVER GREATER THAN 100.5 F  *CHILLS WITH OR WITHOUT FEVER  NAUSEA AND VOMITING THAT IS NOT CONTROLLED WITH YOUR NAUSEA MEDICATION  *UNUSUAL SHORTNESS OF BREATH  *UNUSUAL BRUISING OR BLEEDING  TENDERNESS IN MOUTH AND THROAT WITH OR WITHOUT PRESENCE OF ULCERS  *URINARY PROBLEMS  *BOWEL PROBLEMS  UNUSUAL RASH Items with * indicate a potential emergency and should be followed up as soon as possible.  Feel free to call the clinic should you have any questions or concerns. The clinic phone number is [phone].  Please show the [facility] [place] at check-in to the Emergency Department and triage nurse.

## 2014-11-03 NOTE — Progress Notes (Signed)
CBC reviewed with Dr. Alvy Bimler. OK to treat with PLT 82k.

## 2014-11-04 ENCOUNTER — Ambulatory Visit (HOSPITAL_BASED_OUTPATIENT_CLINIC_OR_DEPARTMENT_OTHER): Payer: Medicare HMO

## 2014-11-04 VITALS — BP 126/58 | HR 81 | Temp 98.6°F | Resp 18

## 2014-11-04 DIAGNOSIS — Z5112 Encounter for antineoplastic immunotherapy: Secondary | ICD-10-CM | POA: Diagnosis not present

## 2014-11-04 DIAGNOSIS — C9000 Multiple myeloma not having achieved remission: Secondary | ICD-10-CM | POA: Diagnosis not present

## 2014-11-04 MED ORDER — SODIUM CHLORIDE 0.9 % IV SOLN: Freq: Once | INTRAVENOUS | Status: DC

## 2014-11-04 MED ORDER — SODIUM CHLORIDE 0.9 % IV SOLN
Freq: Once | INTRAVENOUS | Status: AC
  Administered 2014-11-04: 08:00:00 via INTRAVENOUS

## 2014-11-04 MED ORDER — SODIUM CHLORIDE 0.9 % IV SOLN
Freq: Once | INTRAVENOUS | Status: AC
  Administered 2014-11-04: 08:00:00 via INTRAVENOUS
  Filled 2014-11-04: qty 4

## 2014-11-04 MED ORDER — SODIUM CHLORIDE 0.9 % IJ SOLN
10.0000 mL | INTRAMUSCULAR | Status: DC | PRN
  Administered 2014-11-04: 10 mL
  Filled 2014-11-04: qty 10

## 2014-11-04 MED ORDER — HEPARIN SOD (PORK) LOCK FLUSH 100 UNIT/ML IV SOLN
500.0000 [IU] | Freq: Once | INTRAVENOUS | Status: AC | PRN
  Administered 2014-11-04: 500 [IU]
  Filled 2014-11-04: qty 5

## 2014-11-04 MED ORDER — DEXTROSE 5 % IV SOLN
20.0000 mg/m2 | Freq: Once | INTRAVENOUS | Status: AC
  Administered 2014-11-04: 36 mg via INTRAVENOUS
  Filled 2014-11-04: qty 18

## 2014-11-04 NOTE — Patient Instructions (Signed)
[facility] Discharge Instructions for Patients Receiving Chemotherapy  Today you received the following chemotherapy agent Kryprolis.  To help prevent nausea and vomiting after your treatment, we encourage you to take your nausea medication as prescribed by your MD. If you develop nausea and vomiting that is not controlled by your nausea medication, call the clinic.   BELOW ARE SYMPTOMS THAT SHOULD BE REPORTED IMMEDIATELY:  *FEVER GREATER THAN 100.5 F  *CHILLS WITH OR WITHOUT FEVER  NAUSEA AND VOMITING THAT IS NOT CONTROLLED WITH YOUR NAUSEA MEDICATION  *UNUSUAL SHORTNESS OF BREATH  *UNUSUAL BRUISING OR BLEEDING  TENDERNESS IN MOUTH AND THROAT WITH OR WITHOUT PRESENCE OF ULCERS  *URINARY PROBLEMS  *BOWEL PROBLEMS  UNUSUAL RASH Items with * indicate a potential emergency and should be followed up as soon as possible.  Feel free to call the clinic you have any questions or concerns. The clinic phone number is [phone].  Please show the [facility] at check-in to the Emergency Department and triage nurse.

## 2014-11-05 LAB — SPEP & IFE WITH QIG
Abnormal Protein Band1: 0.2 g/dL
Albumin ELP: 3.5 g/dL — ABNORMAL LOW (ref 3.8–4.8)
Alpha-1-Globulin: 0.2 g/dL (ref 0.2–0.3)
Alpha-2-Globulin: 0.4 g/dL — ABNORMAL LOW (ref 0.5–0.9)
Beta 2: 0.5 g/dL (ref 0.2–0.5)
Beta Globulin: 0.3 g/dL — ABNORMAL LOW (ref 0.4–0.6)
Gamma Globulin: 0.5 g/dL — ABNORMAL LOW (ref 0.8–1.7)
IgA: 392 mg/dL — ABNORMAL HIGH (ref 69–380)
IgG (Immunoglobin G), Serum: 618 mg/dL — ABNORMAL LOW (ref 690–1700)
IgM, Serum: 15 mg/dL — ABNORMAL LOW (ref 52–322)
Total Protein, Serum Electrophoresis: 5.4 g/dL — ABNORMAL LOW (ref 6.1–8.1)

## 2014-11-05 LAB — KAPPA/LAMBDA LIGHT CHAINS
Kappa free light chain: 0.73 mg/dL (ref 0.33–1.94)
Kappa:Lambda Ratio: 0.15 — ABNORMAL LOW (ref 0.26–1.65)
Lambda Free Lght Chn: 4.87 mg/dL — ABNORMAL HIGH (ref 0.57–2.63)

## 2014-11-05 LAB — BETA 2 MICROGLOBULIN, SERUM: Beta-2 Microglobulin: 2.68 mg/L — ABNORMAL HIGH (ref <=2.51)

## 2014-11-09 ENCOUNTER — Telehealth: Payer: Self-pay | Admitting: *Deleted

## 2014-11-09 ENCOUNTER — Telehealth: Payer: Self-pay

## 2014-11-09 NOTE — Telephone Encounter (Signed)
Pt reports has a prolapsed bladder and has Pessary placed this past Thursday by Uro/Gyn in [place].   She was not started on any antibiotics.  Does not have any fevers or burning w/ urination.  She does have some urgency and frequency but attributes that to the "fallen bladder."  Instructed pt to call if she develops any worsening symptoms suggestive of UTI such as pain, burning or cloudy, foul smelling urine.  And as always, call for any fever over 100.5 F.   Pt verbalized understanding.  States she is taking day #21 of Revlimid today and will restart next week on 5/24.   Informed pt we will get refill request from [facility] this week and take care of refill.

## 2014-11-09 NOTE — Telephone Encounter (Signed)
Unless she has UTI symptoms, I typically do not treat I think I might have signed recent Revlimid Not sure

## 2014-11-09 NOTE — Telephone Encounter (Signed)
VM message from patient. She needs refill on her Revlimid.  Also she saw her GYN on last Thursday, 11/05/14 and u/a revealed trace leucocytes and trace blood. She wanted Dr. Alvy Bimler to know.

## 2014-11-09 NOTE — Telephone Encounter (Signed)
Incoming fax from [facility] for revlimid refill placed on Dr [employer] nurse's desk

## 2014-11-10 ENCOUNTER — Other Ambulatory Visit: Payer: Self-pay | Admitting: *Deleted

## 2014-11-10 MED ORDER — LENALIDOMIDE 25 MG PO CAPS: 25.0000 mg | ORAL_CAPSULE | Freq: Every day | ORAL | Status: DC

## 2014-11-13 ENCOUNTER — Other Ambulatory Visit: Payer: Self-pay | Admitting: Hematology and Oncology

## 2014-11-13 ENCOUNTER — Ambulatory Visit (HOSPITAL_BASED_OUTPATIENT_CLINIC_OR_DEPARTMENT_OTHER): Payer: Medicare HMO

## 2014-11-13 ENCOUNTER — Telehealth: Payer: Self-pay | Admitting: *Deleted

## 2014-11-13 ENCOUNTER — Telehealth: Payer: Self-pay | Admitting: Hematology and Oncology

## 2014-11-13 ENCOUNTER — Other Ambulatory Visit (HOSPITAL_BASED_OUTPATIENT_CLINIC_OR_DEPARTMENT_OTHER): Payer: Medicare HMO

## 2014-11-13 ENCOUNTER — Ambulatory Visit (HOSPITAL_BASED_OUTPATIENT_CLINIC_OR_DEPARTMENT_OTHER): Payer: Medicare HMO | Admitting: Hematology and Oncology

## 2014-11-13 ENCOUNTER — Encounter: Payer: Self-pay | Admitting: Hematology and Oncology

## 2014-11-13 VITALS — BP 143/62 | HR 68 | Temp 98.2°F | Resp 17 | Ht 62.0 in | Wt 156.2 lb

## 2014-11-13 DIAGNOSIS — T451X5A Adverse effect of antineoplastic and immunosuppressive drugs, initial encounter: Secondary | ICD-10-CM

## 2014-11-13 DIAGNOSIS — D701 Agranulocytosis secondary to cancer chemotherapy: Secondary | ICD-10-CM | POA: Diagnosis not present

## 2014-11-13 DIAGNOSIS — D630 Anemia in neoplastic disease: Secondary | ICD-10-CM | POA: Diagnosis not present

## 2014-11-13 DIAGNOSIS — C9000 Multiple myeloma not having achieved remission: Secondary | ICD-10-CM

## 2014-11-13 DIAGNOSIS — Z95828 Presence of other vascular implants and grafts: Secondary | ICD-10-CM

## 2014-11-13 DIAGNOSIS — T50905A Adverse effect of unspecified drugs, medicaments and biological substances, initial encounter: Secondary | ICD-10-CM

## 2014-11-13 DIAGNOSIS — D61818 Other pancytopenia: Secondary | ICD-10-CM

## 2014-11-13 DIAGNOSIS — D6959 Other secondary thrombocytopenia: Secondary | ICD-10-CM | POA: Diagnosis not present

## 2014-11-13 LAB — COMPREHENSIVE METABOLIC PANEL (CC13)
ALT: 11 U/L (ref 0–55)
AST: 14 U/L (ref 5–34)
Albumin: 3.7 g/dL (ref 3.5–5.0)
Alkaline Phosphatase: 46 U/L (ref 40–150)
Anion Gap: 9 mEq/L (ref 3–11)
BUN: 9.2 mg/dL (ref 7.0–26.0)
CO2: 26 mEq/L (ref 22–29)
Calcium: 8.4 mg/dL (ref 8.4–10.4)
Chloride: 109 mEq/L (ref 98–109)
Creatinine: 0.7 mg/dL (ref 0.6–1.1)
EGFR: >90 mL/min/{1.73_m2} (ref >90)
Glucose: 92 mg/dl (ref 70–140)
Potassium: 4.2 mEq/L (ref 3.5–5.1)
Sodium: 144 mEq/L (ref 136–145)
Total Bilirubin: 0.56 mg/dL (ref 0.20–1.20)
Total Protein: 5.8 g/dL — ABNORMAL LOW (ref 6.4–8.3)

## 2014-11-13 LAB — CBC WITH DIFFERENTIAL/PLATELET
BASO%: 0.3 % (ref 0.0–2.0)
Basophils Absolute: 0 10*3/uL (ref 0.0–0.1)
EOS%: 1.2 % (ref 0.0–7.0)
Eosinophils Absolute: 0 10*3/uL (ref 0.0–0.5)
HCT: 34.5 % — ABNORMAL LOW (ref 34.8–46.6)
HGB: 11.6 g/dL (ref 11.6–15.9)
LYMPH%: 35.8 % (ref 14.0–49.7)
MCH: 31.9 pg (ref 25.1–34.0)
MCHC: 33.6 g/dL (ref 31.5–36.0)
MCV: 94.8 fL (ref 79.5–101.0)
MONO#: 0.9 10*3/uL (ref 0.1–0.9)
MONO%: 26 % — ABNORMAL HIGH (ref 0.0–14.0)
NEUT#: 1.2 10*3/uL — ABNORMAL LOW (ref 1.5–6.5)
NEUT%: 36.7 % — ABNORMAL LOW (ref 38.4–76.8)
Platelets: 117 10*3/uL — ABNORMAL LOW (ref 145–400)
RBC: 3.64 10*6/uL — ABNORMAL LOW (ref 3.70–5.45)
RDW: 14.4 % (ref 11.2–14.5)
WBC: 3.4 10*3/uL — ABNORMAL LOW (ref 3.9–10.3)
lymph#: 1.2 10*3/uL (ref 0.9–3.3)

## 2014-11-13 MED ORDER — SODIUM CHLORIDE 0.9 % IJ SOLN
10.0000 mL | INTRAMUSCULAR | Status: DC | PRN
  Administered 2014-11-13: 10 mL via INTRAVENOUS
  Filled 2014-11-13: qty 10

## 2014-11-13 MED ORDER — HEPARIN SOD (PORK) LOCK FLUSH 100 UNIT/ML IV SOLN
500.0000 [IU] | Freq: Once | INTRAVENOUS | Status: AC
  Administered 2014-11-13: 500 [IU] via INTRAVENOUS
  Filled 2014-11-13: qty 5

## 2014-11-13 NOTE — Patient Instructions (Signed)

## 2014-11-13 NOTE — Assessment & Plan Note (Signed)
This is likely due to recent treatment. The patient denies recent history of bleeding such as epistaxis, hematuria or hematochezia. She is asymptomatic from the anemia. I will observe for now.  She does not require transfusion now. I will continue the chemotherapy at current dose without dosage adjustment.  If the anemia gets progressive worse in the future, I might have to delay her treatment or adjust the chemotherapy dose.  

## 2014-11-13 NOTE — Progress Notes (Signed)
. [facility] OFFICE PROGRESS NOTE  Patient Care Team: Jean Sanders, MD as PCP - General (Family Medicine) Jean Dibble, MD as Referring Physician (Hematology and Oncology) Jean Lewandowsky, MD as Consulting Physician (Internal Medicine)  SUMMARY OF ONCOLOGIC HISTORY: Oncology History   Multiple myeloma, Ig A Lambda, M spike 3.54 grams, Calcium 9.2, Creatinine 0.8, Beta 2 microglobulin 4.52, IgA 4840 mg/dL, lambda light chain 75.4, albumin 3.6, hemoglobin 9.7, platelet 115    Primary site: Multiple Myeloma   Staging method: AJCC 6th Edition   Clinical: Stage IIA signed by Jean Lark, MD on 11/07/2013  2:46 PM   Summary: Stage IIA        Multiple myeloma   10/31/2013 Bone Marrow Biopsy Bone marrow biopsy confirmed multiple myeloma with 40% bone marrow involvement. Skeletal survey showed minimal lesions in her score with generalized demineralization   11/10/2013 - 02/13/2014 Chemotherapy The patient is started on induction chemotherapy with weekly dexamethasone 40 mg by mouth as well as Velcade subcutaneous injection on days 1, 4, 8 and 11. On 11/21/2013, she was started on monthly Zometa.   12/23/2013 Adverse Reaction The dose of Velcade was reduced due to thrombocytopenia.   01/28/2014 - 04/07/2014 Chemotherapy Revlimid is added. Treatment was discontinued due to lack of response.   02/24/2014 - 04/07/2014 Chemotherapy Due to worsening peripheral neuropathy, Velcade injection is changed to once a week. Revlimid was given 21 days on, 7 days off.   04/07/2014 - 04/10/2014 Chemotherapy Revlimid was discontinued due to lack of response. Chemotherapy was changed back to Velcade injection twice a week, 2 weeks on 1 week off. Her treatment was switched to to minimum response   04/20/2014 - 06/02/2014 Chemotherapy chemotherapy is switched to Carfilzomib, Cytoxan and dexamethasone.   04/22/2014 Procedure she has placement of port for chemotherapy.   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 She returns for further follow-up. She had multiple questions regarding her blood test result. She is also interested to know more about car- Tcell/immunotherapy. She had very mild constipation resolved with laxative. She is no recent infection. The patient denies any recent signs or symptoms of bleeding such as spontaneous epistaxis, hematuria or hematochezia. She also complained of symptoms of bladder prolapse but they are improving with insertion of pessary  REVIEW OF SYSTEMS:   Constitutional: Denies fevers, chills or abnormal weight loss Eyes: Denies blurriness of vision Ears, nose, mouth, throat, and face: Denies mucositis or sore throat Respiratory: Denies cough, dyspnea or wheezes Cardiovascular: Denies palpitation, chest discomfort or lower extremity swelling Skin: Denies abnormal skin rashes Lymphatics: Denies new lymphadenopathy or easy bruising Neurological:Denies numbness, tingling or new weaknesses Behavioral/Psych: Mood is stable, no new changes  All other systems were reviewed with the patient and are negative.  I have reviewed the past medical history, past surgical history, social history and family history with the patient and they are unchanged from previous note.  ALLERGIES:  is allergic to penicillins and sulfa antibiotics.  MEDICATIONS:  Current Outpatient Prescriptions  Medication Sig  Dispense Refill  . acetaminophen (TYLENOL) 500 MG tablet Take  1,500 mg by mouth once.    Marland Kitchen acyclovir (ZOVIRAX) 400 MG tablet Take 1 tablet (400 mg total) by mouth 2 (two) times daily. 180 tablet 3  . calcium carbonate (TUMS EX) 750 MG chewable tablet Chew 1 tablet by mouth daily.     . carvedilol (COREG) 6.25 MG tablet Take 3.125 mg by mouth 2 (two) times daily with a meal.    . cholecalciferol (VITAMIN D) 1000 UNITS tablet Take 1,000 Units by mouth daily.    Marland Kitchen docusate sodium (COLACE) 100 MG capsule Take 100 mg by mouth 2 (two) times daily.    Marland Kitchen lenalidomide (REVLIMID) 25 MG capsule Take 1 capsule (25 mg total) by mouth daily. 21 capsule 0  . loperamide (IMODIUM A-D) 2 MG tablet Take 2 mg by mouth 4 (four) times daily as needed for diarrhea or loose stools.    . mirtazapine (REMERON) 30 MG tablet TAKE 1 TABLET BY MOUTH AT BEDTIME (Patient taking differently: TAKE 1/2 TABLET BY MOUTH AT BEDTIME) 60 tablet 1  . Multiple Vitamin (MULTIVITAMIN WITH MINERALS) TABS tablet Take 1 tablet by mouth daily.    . ondansetron (ZOFRAN) 8 MG tablet Take 1 tablet (8 mg total) by mouth 2 (two) times daily. Start the day after chemo for 2 days. Then as needed for nausea or vomiting. 30 tablet 1   No current facility-administered medications for this visit.    PHYSICAL EXAMINATION: ECOG PERFORMANCE STATUS: 1 - Symptomatic but completely ambulatory  Filed Vitals:   11/13/14 0915  BP: 143/62  Pulse: 68  Temp: 98.2 F (36.8 C)  Resp: 17   Filed Weights   11/13/14 0915  Weight: 156 lb 3.2 oz (70.852 kg)    GENERAL:alert, no distress and comfortable SKIN: skin color, texture, turgor are normal, no rashes or significant lesions EYES: normal, Conjunctiva are pink and non-injected, sclera clear Musculoskeletal:no cyanosis of digits and no clubbing  NEURO: alert & oriented x 3 with fluent speech, no focal motor/sensory deficits  LABORATORY DATA:  I have reviewed the data as listed    Component Value Date/Time   NA 144 11/13/2014 0843   NA 139 10/22/2014 0750   K  4.2 11/13/2014 0843   K 4.0 10/22/2014 0750   CL 109 10/22/2014 0750   CO2 26 11/13/2014 0843   CO2 23 10/22/2014 0750   GLUCOSE 92 11/13/2014 0843   GLUCOSE 113* 10/22/2014 0750   BUN 9.2 11/13/2014 0843   BUN 13 10/22/2014 0750   CREATININE 0.7 11/13/2014 0843   CREATININE 0.68 10/22/2014 0750   CALCIUM 8.4 11/13/2014 0843   CALCIUM 8.8 10/22/2014 0750   PROT 5.8* 11/13/2014 0843   PROT 8.5* 02/03/2014 0909   ALBUMIN 3.7 11/13/2014 0843   ALBUMIN 3.5 02/03/2014 0909   AST 14 11/13/2014 0843   AST 14 02/03/2014 0909   ALT 11 11/13/2014 0843   ALT 12 02/03/2014 0909   ALKPHOS 46 11/13/2014 0843   ALKPHOS 53 02/03/2014 0909   BILITOT 0.56 11/13/2014 0843   BILITOT <0.2* 02/03/2014 0909   GFRNONAA 88* 10/22/2014 0750   GFRAA >90 10/22/2014 0750    No results found for: SPEP, UPEP  Lab Results  Component Value Date   WBC 3.4* 11/13/2014   NEUTROABS 1.2* 11/13/2014   HGB 11.6 11/13/2014   HCT 34.5* 11/13/2014   MCV 94.8 11/13/2014   PLT 117* 11/13/2014      Chemistry  Component Value Date/Time   NA 144 11/13/2014 0843   NA 139 10/22/2014 0750   K 4.2 11/13/2014 0843   K 4.0 10/22/2014 0750   CL 109 10/22/2014 0750   CO2 26 11/13/2014 0843   CO2 23 10/22/2014 0750   BUN 9.2 11/13/2014 0843   BUN 13 10/22/2014 0750   CREATININE 0.7 11/13/2014 0843   CREATININE 0.68 10/22/2014 0750      Component Value Date/Time   CALCIUM 8.4 11/13/2014 0843   CALCIUM 8.8 10/22/2014 0750   ALKPHOS 46 11/13/2014 0843   ALKPHOS 53 02/03/2014 0909   AST 14 11/13/2014 0843   AST 14 02/03/2014 0909   ALT 11 11/13/2014 0843   ALT 12 02/03/2014 0909   BILITOT 0.56 11/13/2014 0843   BILITOT <0.2* 02/03/2014 0909     ASSESSMENT & PLAN:  Multiple myeloma She is tolerating the treatment well with expected mild pancytopenia related to treatment. I recommend we continue the same treatment without dose adjustment. Overall, she is responding to treatment.   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 The patient denies recent history of bleeding such as epistaxis, hematuria or hematochezia. She is asymptomatic from the anemia. I will observe for now.  She does not require transfusion now. I will continue the chemotherapy at current dose without dosage adjustment.  If the anemia gets progressive worse in the future, I might have to delay her treatment or adjust the chemotherapy dose.    Leukopenia due to antineoplastic chemotherapy This is likely due to recent treatment. The patient denies recent history of fevers, cough, chills, diarrhea or dysuria. She is asymptomatic from the leukopenia. I will observe for now.  I will continue the chemotherapy at current dose without dosage adjustment.  If the leukopenia gets progressive worse in the future, I might have to delay her treatment or adjust the chemotherapy dose.     Thrombocytopenia due to drugs This is likely due to recent treatment. The patient denies recent history of bleeding such as epistaxis, hematuria or hematochezia. She is asymptomatic from the low platelet count. I will observe for now.  she does not require transfusion now. I will continue the chemotherapy at current dose without dosage adjustment.  If the thrombocytopenia gets progressive worse in the future, I might have to delay her treatment or adjust the chemotherapy dose.      Orders Placed This Encounter  Procedures  . Lactate dehydrogenase    Standing Status: Future     Number of Occurrences:      Standing Expiration Date: 12/18/2015  . SPEP & IFE with QIG    Standing Status: Future     Number of Occurrences:      Standing Expiration Date: 12/18/2015  . Kappa/lambda light chains    Standing Status: Future     Number of Occurrences:      Standing Expiration Date: 12/18/2015  . Beta 2 microglobulin, serum    Standing Status: Future     Number of Occurrences:      Standing Expiration Date: 12/18/2015   All  questions were answered. The patient knows to call the clinic with any problems, questions or concerns. No barriers to learning was detected. I spent 25 minutes counseling the patient face to face. The total time spent in the appointment was 30 minutes and more than 50% was on counseling and review of test results     [facility], [place], MD 11/13/2014 11:03 AM

## 2014-11-13 NOTE — Assessment & Plan Note (Signed)
This is likely due to recent treatment. The patient denies recent history of fevers, cough, chills, diarrhea or dysuria. She is asymptomatic from the leukopenia. I will observe for now.  I will continue the chemotherapy at current dose without dosage adjustment.  If the leukopenia gets progressive worse in the future, I might have to delay her treatment or adjust the chemotherapy dose.   

## 2014-11-13 NOTE — Telephone Encounter (Signed)
s.w. pt and advised on appt....pt ok and aware of appt....she will ck mychart

## 2014-11-13 NOTE — Telephone Encounter (Signed)
PT. DID NOT NEED ANYTHING ELSE.

## 2014-11-13 NOTE — Assessment & Plan Note (Signed)
She is tolerating the treatment well with expected mild pancytopenia related to treatment. I recommend we continue the same treatment without dose adjustment. Overall, she is responding to treatment.

## 2014-11-13 NOTE — Assessment & Plan Note (Signed)
This is likely due to recent treatment. The patient denies recent history of bleeding such as epistaxis, hematuria or hematochezia. She is asymptomatic from the low platelet count. I will observe for now.  she does not require transfusion now. I will continue the chemotherapy at current dose without dosage adjustment.  If the thrombocytopenia gets progressive worse in the future, I might have to delay her treatment or adjust the chemotherapy dose.   

## 2014-11-17 ENCOUNTER — Other Ambulatory Visit: Payer: Medicare HMO

## 2014-11-17 ENCOUNTER — Other Ambulatory Visit (HOSPITAL_BASED_OUTPATIENT_CLINIC_OR_DEPARTMENT_OTHER): Payer: Medicare HMO

## 2014-11-17 ENCOUNTER — Ambulatory Visit: Payer: Medicare HMO

## 2014-11-17 ENCOUNTER — Ambulatory Visit (HOSPITAL_BASED_OUTPATIENT_CLINIC_OR_DEPARTMENT_OTHER): Payer: Medicare HMO

## 2014-11-17 VITALS — BP 143/65 | HR 67 | Temp 98.9°F | Resp 20

## 2014-11-17 DIAGNOSIS — Z5112 Encounter for antineoplastic immunotherapy: Secondary | ICD-10-CM

## 2014-11-17 DIAGNOSIS — C9000 Multiple myeloma not having achieved remission: Secondary | ICD-10-CM | POA: Diagnosis not present

## 2014-11-17 DIAGNOSIS — Z95828 Presence of other vascular implants and grafts: Secondary | ICD-10-CM

## 2014-11-17 LAB — CBC WITH DIFFERENTIAL/PLATELET
BASO%: 0.2 % (ref 0.0–2.0)
Basophils Absolute: 0 10*3/uL (ref 0.0–0.1)
EOS%: 0.6 % (ref 0.0–7.0)
Eosinophils Absolute: 0 10*3/uL (ref 0.0–0.5)
HCT: 34.3 % — ABNORMAL LOW (ref 34.8–46.6)
HGB: 11.5 g/dL — ABNORMAL LOW (ref 11.6–15.9)
LYMPH%: 37.9 % (ref 14.0–49.7)
MCH: 31.9 pg (ref 25.1–34.0)
MCHC: 33.5 g/dL (ref 31.5–36.0)
MCV: 95 fL (ref 79.5–101.0)
MONO#: 1.2 10*3/uL — ABNORMAL HIGH (ref 0.1–0.9)
MONO%: 22.6 % — ABNORMAL HIGH (ref 0.0–14.0)
NEUT#: 2.1 10*3/uL (ref 1.5–6.5)
NEUT%: 38.7 % (ref 38.4–76.8)
Platelets: 147 10*3/uL (ref 145–400)
RBC: 3.61 10*6/uL — ABNORMAL LOW (ref 3.70–5.45)
RDW: 15 % — ABNORMAL HIGH (ref 11.2–14.5)
WBC: 5.3 10*3/uL (ref 3.9–10.3)
lymph#: 2 10*3/uL (ref 0.9–3.3)

## 2014-11-17 LAB — COMPREHENSIVE METABOLIC PANEL (CC13)
ALT: 7 U/L (ref 0–55)
AST: 14 U/L (ref 5–34)
Albumin: 3.8 g/dL (ref 3.5–5.0)
Alkaline Phosphatase: 58 U/L (ref 40–150)
Anion Gap: 8 mEq/L (ref 3–11)
BUN: 9.6 mg/dL (ref 7.0–26.0)
CO2: 24 mEq/L (ref 22–29)
Calcium: 7.9 mg/dL — ABNORMAL LOW (ref 8.4–10.4)
Chloride: 109 mEq/L (ref 98–109)
Creatinine: 0.6 mg/dL (ref 0.6–1.1)
EGFR: >90 mL/min/{1.73_m2} (ref >90)
Glucose: 89 mg/dl (ref 70–140)
Potassium: 4 mEq/L (ref 3.5–5.1)
Sodium: 141 mEq/L (ref 136–145)
Total Bilirubin: 0.43 mg/dL (ref 0.20–1.20)
Total Protein: 5.9 g/dL — ABNORMAL LOW (ref 6.4–8.3)

## 2014-11-17 MED ORDER — SODIUM CHLORIDE 0.9 % IV SOLN
Freq: Once | INTRAVENOUS | Status: AC
  Administered 2014-11-17: 12:00:00 via INTRAVENOUS

## 2014-11-17 MED ORDER — SODIUM CHLORIDE 0.9 % IJ SOLN
10.0000 mL | INTRAMUSCULAR | Status: DC | PRN
  Administered 2014-11-17: 10 mL
  Filled 2014-11-17: qty 10

## 2014-11-17 MED ORDER — SODIUM CHLORIDE 0.9 % IV SOLN
Freq: Once | INTRAVENOUS | Status: AC
  Administered 2014-11-17: 12:00:00 via INTRAVENOUS
  Filled 2014-11-17: qty 4

## 2014-11-17 MED ORDER — SODIUM CHLORIDE 0.9 % IJ SOLN
10.0000 mL | INTRAMUSCULAR | Status: DC | PRN
  Administered 2014-11-17: 10 mL via INTRAVENOUS
  Filled 2014-11-17: qty 10

## 2014-11-17 MED ORDER — HEPARIN SOD (PORK) LOCK FLUSH 100 UNIT/ML IV SOLN
500.0000 [IU] | Freq: Once | INTRAVENOUS | Status: AC | PRN
  Administered 2014-11-17: 500 [IU]
  Filled 2014-11-17: qty 5

## 2014-11-17 MED ORDER — DEXTROSE 5 % IV SOLN
20.0000 mg/m2 | Freq: Once | INTRAVENOUS | Status: AC
  Administered 2014-11-17: 36 mg via INTRAVENOUS
  Filled 2014-11-17: qty 18

## 2014-11-17 MED ORDER — SODIUM CHLORIDE 0.9 % IV SOLN: Freq: Once | INTRAVENOUS | Status: DC

## 2014-11-17 NOTE — Patient Instructions (Signed)
[facility] Discharge Instructions for Patients Receiving Chemotherapy  Today you received the following chemotherapy agents:  Kyprolis  To help prevent nausea and vomiting after your treatment, we encourage you to take your nausea medication as ordered per MD.   If you develop nausea and vomiting that is not controlled by your nausea medication, call the clinic.   BELOW ARE SYMPTOMS THAT SHOULD BE REPORTED IMMEDIATELY:  *FEVER GREATER THAN 100.5 F  *CHILLS WITH OR WITHOUT FEVER  NAUSEA AND VOMITING THAT IS NOT CONTROLLED WITH YOUR NAUSEA MEDICATION  *UNUSUAL SHORTNESS OF BREATH  *UNUSUAL BRUISING OR BLEEDING  TENDERNESS IN MOUTH AND THROAT WITH OR WITHOUT PRESENCE OF ULCERS  *URINARY PROBLEMS  *BOWEL PROBLEMS  UNUSUAL RASH Items with * indicate a potential emergency and should be followed up as soon as possible.  Feel free to call the clinic you have any questions or concerns. The clinic phone number is [phone].  Please show the CHEMO ALERT CARD at check-in to the Emergency Department and triage nurse.   

## 2014-11-17 NOTE — Patient Instructions (Signed)

## 2014-11-18 ENCOUNTER — Ambulatory Visit (HOSPITAL_BASED_OUTPATIENT_CLINIC_OR_DEPARTMENT_OTHER): Payer: Medicare HMO

## 2014-11-18 VITALS — BP 131/55 | HR 19 | Temp 98.5°F | Resp 18

## 2014-11-18 DIAGNOSIS — C9000 Multiple myeloma not having achieved remission: Secondary | ICD-10-CM | POA: Diagnosis not present

## 2014-11-18 DIAGNOSIS — Z5112 Encounter for antineoplastic immunotherapy: Secondary | ICD-10-CM

## 2014-11-18 MED ORDER — SODIUM CHLORIDE 0.9 % IJ SOLN
10.0000 mL | INTRAMUSCULAR | Status: DC | PRN
  Administered 2014-11-18: 10 mL
  Filled 2014-11-18: qty 10

## 2014-11-18 MED ORDER — DEXTROSE 5 % IV SOLN
20.0000 mg/m2 | Freq: Once | INTRAVENOUS | Status: AC
  Administered 2014-11-18: 36 mg via INTRAVENOUS
  Filled 2014-11-18: qty 18

## 2014-11-18 MED ORDER — SODIUM CHLORIDE 0.9 % IV SOLN
Freq: Once | INTRAVENOUS | Status: AC
  Administered 2014-11-18: 12:00:00 via INTRAVENOUS

## 2014-11-18 MED ORDER — HEPARIN SOD (PORK) LOCK FLUSH 100 UNIT/ML IV SOLN
500.0000 [IU] | Freq: Once | INTRAVENOUS | Status: AC | PRN
  Administered 2014-11-18: 500 [IU]
  Filled 2014-11-18: qty 5

## 2014-11-18 MED ORDER — SODIUM CHLORIDE 0.9 % IV SOLN
Freq: Once | INTRAVENOUS | Status: AC
  Administered 2014-11-18: 12:00:00 via INTRAVENOUS
  Filled 2014-11-18: qty 4

## 2014-11-18 NOTE — Progress Notes (Signed)
Discharged at 1312 ambulatory in no distress.

## 2014-11-18 NOTE — Progress Notes (Signed)
Vitals post treatment wnl

## 2014-11-18 NOTE — Patient Instructions (Signed)
[facility] Discharge Instructions for Patients Receiving Chemotherapy  Today you received the following chemotherapy agents Kyprolis.  To help prevent nausea and vomiting after your treatment, we encourage you to take your nausea medication Zofran 8 mg as ordered by Dr. Alvy Bimler.   If you develop nausea and vomiting that is not controlled by your nausea medication, call the clinic.   BELOW ARE SYMPTOMS THAT SHOULD BE REPORTED IMMEDIATELY:  *FEVER GREATER THAN 100.5 F  *CHILLS WITH OR WITHOUT FEVER  NAUSEA AND VOMITING THAT IS NOT CONTROLLED WITH YOUR NAUSEA MEDICATION  *UNUSUAL SHORTNESS OF BREATH  *UNUSUAL BRUISING OR BLEEDING  TENDERNESS IN MOUTH AND THROAT WITH OR WITHOUT PRESENCE OF ULCERS  *URINARY PROBLEMS  *BOWEL PROBLEMS  UNUSUAL RASH Items with * indicate a potential emergency and should be followed up as soon as possible.  Feel free to call the clinic you have any questions or concerns. The clinic phone number is [phone].  Please show the [place] at check-in to the Emergency Department and triage nurse.

## 2014-11-18 NOTE — Progress Notes (Signed)
1140 Jean Davidson says "I do not receive the hydration".

## 2014-11-26 ENCOUNTER — Other Ambulatory Visit (HOSPITAL_BASED_OUTPATIENT_CLINIC_OR_DEPARTMENT_OTHER): Payer: Medicare HMO

## 2014-11-26 ENCOUNTER — Ambulatory Visit (HOSPITAL_BASED_OUTPATIENT_CLINIC_OR_DEPARTMENT_OTHER): Payer: Medicare HMO

## 2014-11-26 ENCOUNTER — Ambulatory Visit: Payer: Medicare HMO

## 2014-11-26 VITALS — BP 139/60 | HR 67 | Temp 98.4°F

## 2014-11-26 DIAGNOSIS — C9000 Multiple myeloma not having achieved remission: Secondary | ICD-10-CM

## 2014-11-26 DIAGNOSIS — Z5112 Encounter for antineoplastic immunotherapy: Secondary | ICD-10-CM | POA: Diagnosis not present

## 2014-11-26 DIAGNOSIS — Z95828 Presence of other vascular implants and grafts: Secondary | ICD-10-CM

## 2014-11-26 LAB — COMPREHENSIVE METABOLIC PANEL (CC13)
ALT: 11 U/L (ref 0–55)
AST: 14 U/L (ref 5–34)
Albumin: 3.7 g/dL (ref 3.5–5.0)
Alkaline Phosphatase: 44 U/L (ref 40–150)
Anion Gap: 7 mEq/L (ref 3–11)
BUN: 7.7 mg/dL (ref 7.0–26.0)
CO2: 26 mEq/L (ref 22–29)
Calcium: 8.5 mg/dL (ref 8.4–10.4)
Chloride: 108 mEq/L (ref 98–109)
Creatinine: 0.7 mg/dL (ref 0.6–1.1)
EGFR: >90 mL/min/{1.73_m2} (ref >90)
Glucose: 89 mg/dl (ref 70–140)
Potassium: 4.1 mEq/L (ref 3.5–5.1)
Sodium: 141 mEq/L (ref 136–145)
Total Bilirubin: 0.71 mg/dL (ref 0.20–1.20)
Total Protein: 5.9 g/dL — ABNORMAL LOW (ref 6.4–8.3)

## 2014-11-26 LAB — CBC WITH DIFFERENTIAL/PLATELET
BASO%: 0.2 % (ref 0.0–2.0)
Basophils Absolute: 0 10*3/uL (ref 0.0–0.1)
EOS%: 2.6 % (ref 0.0–7.0)
Eosinophils Absolute: 0.1 10*3/uL (ref 0.0–0.5)
HCT: 36.3 % (ref 34.8–46.6)
HGB: 12.1 g/dL (ref 11.6–15.9)
LYMPH%: 19.8 % (ref 14.0–49.7)
MCH: 31.7 pg (ref 25.1–34.0)
MCHC: 33.3 g/dL (ref 31.5–36.0)
MCV: 95 fL (ref 79.5–101.0)
MONO#: 0.9 10*3/uL (ref 0.1–0.9)
MONO%: 21.4 % — ABNORMAL HIGH (ref 0.0–14.0)
NEUT#: 2.4 10*3/uL (ref 1.5–6.5)
NEUT%: 56 % (ref 38.4–76.8)
Platelets: 83 10*3/uL — ABNORMAL LOW (ref 145–400)
RBC: 3.82 10*6/uL (ref 3.70–5.45)
RDW: 14.9 % — ABNORMAL HIGH (ref 11.2–14.5)
WBC: 4.3 10*3/uL (ref 3.9–10.3)
lymph#: 0.8 10*3/uL — ABNORMAL LOW (ref 0.9–3.3)

## 2014-11-26 MED ORDER — DEXTROSE 5 % IV SOLN
20.0000 mg/m2 | Freq: Once | INTRAVENOUS | Status: AC
  Administered 2014-11-26: 36 mg via INTRAVENOUS
  Filled 2014-11-26: qty 18

## 2014-11-26 MED ORDER — SODIUM CHLORIDE 0.9 % IJ SOLN
10.0000 mL | INTRAMUSCULAR | Status: DC | PRN
  Administered 2014-11-26: 10 mL
  Filled 2014-11-26: qty 10

## 2014-11-26 MED ORDER — SODIUM CHLORIDE 0.9 % IJ SOLN
10.0000 mL | INTRAMUSCULAR | Status: DC | PRN
Start: 2014-11-26 — End: 2014-11-26
  Administered 2014-11-26: 10 mL via INTRAVENOUS
  Filled 2014-11-26: qty 10

## 2014-11-26 MED ORDER — SODIUM CHLORIDE 0.9 % IV SOLN
Freq: Once | INTRAVENOUS | Status: AC
  Administered 2014-11-26: 09:00:00 via INTRAVENOUS
  Filled 2014-11-26: qty 4

## 2014-11-26 MED ORDER — SODIUM CHLORIDE 0.9 % IV SOLN
Freq: Once | INTRAVENOUS | Status: AC
  Administered 2014-11-26: 09:00:00 via INTRAVENOUS

## 2014-11-26 MED ORDER — HEPARIN SOD (PORK) LOCK FLUSH 100 UNIT/ML IV SOLN
500.0000 [IU] | Freq: Once | INTRAVENOUS | Status: AC | PRN
  Administered 2014-11-26: 500 [IU]
  Filled 2014-11-26: qty 5

## 2014-11-26 NOTE — Progress Notes (Signed)
Ok to treat today with low platelets per Dr. Alvy Bimler.

## 2014-11-26 NOTE — Progress Notes (Signed)
Patient refused extra hydration with kyprolis.

## 2014-11-26 NOTE — Patient Instructions (Signed)

## 2014-11-26 NOTE — Patient Instructions (Signed)
[facility] Discharge Instructions for Patients Receiving Chemotherapy  Today you received the following chemotherapy agents Kyprolis.  To help prevent nausea and vomiting after your treatment, we encourage you to take your nausea medication Zofran 8 mg as ordered by Dr. Alvy Bimler.   If you develop nausea and vomiting that is not controlled by your nausea medication, call the clinic.   BELOW ARE SYMPTOMS THAT SHOULD BE REPORTED IMMEDIATELY:  *FEVER GREATER THAN 100.5 F  *CHILLS WITH OR WITHOUT FEVER  NAUSEA AND VOMITING THAT IS NOT CONTROLLED WITH YOUR NAUSEA MEDICATION  *UNUSUAL SHORTNESS OF BREATH  *UNUSUAL BRUISING OR BLEEDING  TENDERNESS IN MOUTH AND THROAT WITH OR WITHOUT PRESENCE OF ULCERS  *URINARY PROBLEMS  *BOWEL PROBLEMS  UNUSUAL RASH Items with * indicate a potential emergency and should be followed up as soon as possible.  Feel free to call the clinic you have any questions or concerns. The clinic phone number is [phone].  Please show the [place] at check-in to the Emergency Department and triage nurse.

## 2014-11-27 ENCOUNTER — Ambulatory Visit (HOSPITAL_BASED_OUTPATIENT_CLINIC_OR_DEPARTMENT_OTHER): Payer: Medicare HMO

## 2014-11-27 VITALS — BP 138/58 | HR 72 | Temp 97.4°F | Resp 18

## 2014-11-27 DIAGNOSIS — Z5112 Encounter for antineoplastic immunotherapy: Secondary | ICD-10-CM

## 2014-11-27 DIAGNOSIS — C9000 Multiple myeloma not having achieved remission: Secondary | ICD-10-CM | POA: Diagnosis not present

## 2014-11-27 MED ORDER — HEPARIN SOD (PORK) LOCK FLUSH 100 UNIT/ML IV SOLN
500.0000 [IU] | Freq: Once | INTRAVENOUS | Status: AC | PRN
  Administered 2014-11-27: 500 [IU]
  Filled 2014-11-27: qty 5

## 2014-11-27 MED ORDER — SODIUM CHLORIDE 0.9 % IV SOLN
250.0000 mL | Freq: Once | INTRAVENOUS | Status: AC
  Administered 2014-11-27: 250 mL via INTRAVENOUS

## 2014-11-27 MED ORDER — SODIUM CHLORIDE 0.9 % IV SOLN
Freq: Once | INTRAVENOUS | Status: AC
  Administered 2014-11-27: 10:00:00 via INTRAVENOUS
  Filled 2014-11-27: qty 4

## 2014-11-27 MED ORDER — DEXTROSE 5 % IV SOLN
20.0000 mg/m2 | Freq: Once | INTRAVENOUS | Status: AC
  Administered 2014-11-27: 36 mg via INTRAVENOUS
  Filled 2014-11-27: qty 18

## 2014-11-27 MED ORDER — SODIUM CHLORIDE 0.9 % IJ SOLN
10.0000 mL | INTRAMUSCULAR | Status: DC | PRN
  Administered 2014-11-27: 10 mL
  Filled 2014-11-27: qty 10

## 2014-11-27 MED ORDER — SODIUM CHLORIDE 0.9 % IV SOLN: Freq: Once | INTRAVENOUS | Status: DC

## 2014-11-27 NOTE — Patient Instructions (Signed)
[facility] Discharge Instructions for Patients Receiving Chemotherapy  Today you received the following chemotherapy agents: Kyprolis   To help prevent nausea and vomiting after your treatment, we encourage you to take your nausea medication as directed.    If you develop nausea and vomiting that is not controlled by your nausea medication, call the clinic.   BELOW ARE SYMPTOMS THAT SHOULD BE REPORTED IMMEDIATELY:  *FEVER GREATER THAN 100.5 F  *CHILLS WITH OR WITHOUT FEVER  NAUSEA AND VOMITING THAT IS NOT CONTROLLED WITH YOUR NAUSEA MEDICATION  *UNUSUAL SHORTNESS OF BREATH  *UNUSUAL BRUISING OR BLEEDING  TENDERNESS IN MOUTH AND THROAT WITH OR WITHOUT PRESENCE OF ULCERS  *URINARY PROBLEMS  *BOWEL PROBLEMS  UNUSUAL RASH Items with * indicate a potential emergency and should be followed up as soon as possible.  Feel free to call the clinic you have any questions or concerns. The clinic phone number is [phone].  Please show the CHEMO ALERT CARD at check-in to the Emergency Department and triage nurse.   

## 2014-12-01 ENCOUNTER — Other Ambulatory Visit: Payer: Medicare HMO

## 2014-12-01 ENCOUNTER — Ambulatory Visit: Payer: Medicare HMO

## 2014-12-02 ENCOUNTER — Ambulatory Visit: Payer: Medicare HMO

## 2014-12-03 ENCOUNTER — Other Ambulatory Visit (HOSPITAL_BASED_OUTPATIENT_CLINIC_OR_DEPARTMENT_OTHER): Payer: Medicare HMO

## 2014-12-03 ENCOUNTER — Ambulatory Visit: Payer: Medicare HMO

## 2014-12-03 ENCOUNTER — Ambulatory Visit (HOSPITAL_BASED_OUTPATIENT_CLINIC_OR_DEPARTMENT_OTHER): Payer: Medicare HMO

## 2014-12-03 VITALS — BP 135/56 | HR 66 | Temp 99.1°F | Resp 18

## 2014-12-03 DIAGNOSIS — C9000 Multiple myeloma not having achieved remission: Secondary | ICD-10-CM

## 2014-12-03 DIAGNOSIS — Z95828 Presence of other vascular implants and grafts: Secondary | ICD-10-CM

## 2014-12-03 DIAGNOSIS — Z5112 Encounter for antineoplastic immunotherapy: Secondary | ICD-10-CM | POA: Diagnosis not present

## 2014-12-03 LAB — CBC WITH DIFFERENTIAL/PLATELET
BASO%: 0.2 % (ref 0.0–2.0)
Basophils Absolute: 0 10*3/uL (ref 0.0–0.1)
EOS%: 3.1 % (ref 0.0–7.0)
Eosinophils Absolute: 0.1 10*3/uL (ref 0.0–0.5)
HCT: 35.3 % (ref 34.8–46.6)
HGB: 11.9 g/dL (ref 11.6–15.9)
LYMPH%: 24.8 % (ref 14.0–49.7)
MCH: 31.7 pg (ref 25.1–34.0)
MCHC: 33.7 g/dL (ref 31.5–36.0)
MCV: 94.1 fL (ref 79.5–101.0)
MONO#: 0.7 10*3/uL (ref 0.1–0.9)
MONO%: 15.6 % — ABNORMAL HIGH (ref 0.0–14.0)
NEUT#: 2.6 10*3/uL (ref 1.5–6.5)
NEUT%: 56.3 % (ref 38.4–76.8)
Platelets: 52 10*3/uL — ABNORMAL LOW (ref 145–400)
RBC: 3.75 10*6/uL (ref 3.70–5.45)
RDW: 15.1 % — ABNORMAL HIGH (ref 11.2–14.5)
WBC: 4.6 10*3/uL (ref 3.9–10.3)
lymph#: 1.1 10*3/uL (ref 0.9–3.3)

## 2014-12-03 LAB — COMPREHENSIVE METABOLIC PANEL (CC13)
ALT: 7 U/L (ref 0–55)
AST: 11 U/L (ref 5–34)
Albumin: 3.7 g/dL (ref 3.5–5.0)
Alkaline Phosphatase: 43 U/L (ref 40–150)
Anion Gap: 4 mEq/L (ref 3–11)
BUN: 11.7 mg/dL (ref 7.0–26.0)
CO2: 27 mEq/L (ref 22–29)
Calcium: 8.5 mg/dL (ref 8.4–10.4)
Chloride: 108 mEq/L (ref 98–109)
Creatinine: 0.7 mg/dL (ref 0.6–1.1)
EGFR: >90 mL/min/{1.73_m2} (ref >90)
Glucose: 88 mg/dl (ref 70–140)
Potassium: 4 mEq/L (ref 3.5–5.1)
Sodium: 140 mEq/L (ref 136–145)
Total Bilirubin: 0.78 mg/dL (ref 0.20–1.20)
Total Protein: 5.8 g/dL — ABNORMAL LOW (ref 6.4–8.3)

## 2014-12-03 LAB — LACTATE DEHYDROGENASE (CC13): LDH: 148 U/L (ref 125–245)

## 2014-12-03 MED ORDER — DEXTROSE 5 % IV SOLN
20.0000 mg/m2 | Freq: Once | INTRAVENOUS | Status: AC
  Administered 2014-12-03: 36 mg via INTRAVENOUS
  Filled 2014-12-03: qty 18

## 2014-12-03 MED ORDER — SODIUM CHLORIDE 0.9 % IV SOLN
Freq: Once | INTRAVENOUS | Status: AC
  Administered 2014-12-03: 10:00:00 via INTRAVENOUS

## 2014-12-03 MED ORDER — SODIUM CHLORIDE 0.9 % IJ SOLN
10.0000 mL | INTRAMUSCULAR | Status: DC | PRN
  Administered 2014-12-03: 10 mL
  Filled 2014-12-03: qty 10

## 2014-12-03 MED ORDER — SODIUM CHLORIDE 0.9 % IJ SOLN
10.0000 mL | INTRAMUSCULAR | Status: DC | PRN
  Administered 2014-12-03: 10 mL via INTRAVENOUS
  Filled 2014-12-03: qty 10

## 2014-12-03 MED ORDER — SODIUM CHLORIDE 0.9 % IV SOLN
Freq: Once | INTRAVENOUS | Status: AC
  Administered 2014-12-03: 11:00:00 via INTRAVENOUS
  Filled 2014-12-03: qty 4

## 2014-12-03 MED ORDER — HEPARIN SOD (PORK) LOCK FLUSH 100 UNIT/ML IV SOLN
500.0000 [IU] | Freq: Once | INTRAVENOUS | Status: AC | PRN
  Administered 2014-12-03: 500 [IU]
  Filled 2014-12-03: qty 5

## 2014-12-03 NOTE — Progress Notes (Signed)
Ok to treat with platelet count: 52. Instructed patient to hold revlimid until 12/15/14.

## 2014-12-03 NOTE — Patient Instructions (Signed)
[facility] for [facility] Receiving Chemotherapy  Today you received the following chemotherapy agents: Kyprolis   To help prevent nausea and vomiting after your treatment, we encourage you to take your nausea medication as directed.    If you develop nausea and vomiting that is not controlled by your nausea medication, call the clinic.   BELOW ARE SYMPTOMS THAT SHOULD BE REPORTED IMMEDIATELY:  *FEVER GREATER THAN 100.5 F  *CHILLS WITH OR WITHOUT FEVER  NAUSEA AND VOMITING THAT IS NOT CONTROLLED WITH YOUR NAUSEA MEDICATION  *UNUSUAL SHORTNESS OF BREATH  *UNUSUAL BRUISING OR BLEEDING  TENDERNESS IN MOUTH AND THROAT WITH OR WITHOUT PRESENCE OF ULCERS  *URINARY PROBLEMS  *BOWEL PROBLEMS  UNUSUAL RASH Items with * indicate a potential emergency and should be followed up as soon as possible.  Feel free to call the clinic you have any questions or concerns. The clinic phone number is [phone].  Please show the CHEMO ALERT CARD at check-in to the Emergency Department and triage nurse.   

## 2014-12-03 NOTE — Patient Instructions (Signed)

## 2014-12-04 ENCOUNTER — Other Ambulatory Visit: Payer: Self-pay | Admitting: *Deleted

## 2014-12-04 ENCOUNTER — Ambulatory Visit (HOSPITAL_BASED_OUTPATIENT_CLINIC_OR_DEPARTMENT_OTHER): Payer: Medicare HMO

## 2014-12-04 ENCOUNTER — Telehealth: Payer: Self-pay | Admitting: *Deleted

## 2014-12-04 VITALS — BP 137/66 | HR 59 | Temp 98.5°F | Resp 18

## 2014-12-04 DIAGNOSIS — Z5112 Encounter for antineoplastic immunotherapy: Secondary | ICD-10-CM

## 2014-12-04 DIAGNOSIS — C9000 Multiple myeloma not having achieved remission: Secondary | ICD-10-CM

## 2014-12-04 MED ORDER — SODIUM CHLORIDE 0.9 % IJ SOLN
10.0000 mL | INTRAMUSCULAR | Status: DC | PRN
  Administered 2014-12-04: 10 mL
  Filled 2014-12-04: qty 10

## 2014-12-04 MED ORDER — HEPARIN SOD (PORK) LOCK FLUSH 100 UNIT/ML IV SOLN
500.0000 [IU] | Freq: Once | INTRAVENOUS | Status: AC | PRN
Start: 2014-12-04 — End: 2014-12-04
  Administered 2014-12-04: 500 [IU]
  Filled 2014-12-04: qty 5

## 2014-12-04 MED ORDER — LENALIDOMIDE 15 MG PO CAPS: 15.0000 mg | ORAL_CAPSULE | Freq: Every day | ORAL | Status: DC

## 2014-12-04 MED ORDER — LENALIDOMIDE 25 MG PO CAPS: 25.0000 mg | ORAL_CAPSULE | Freq: Every day | ORAL | Status: DC

## 2014-12-04 MED ORDER — SODIUM CHLORIDE 0.9 % IV SOLN
Freq: Once | INTRAVENOUS | Status: AC
  Administered 2014-12-04: 10:00:00 via INTRAVENOUS

## 2014-12-04 MED ORDER — SODIUM CHLORIDE 0.9 % IV SOLN
Freq: Once | INTRAVENOUS | Status: AC
  Administered 2014-12-04: 10:00:00 via INTRAVENOUS
  Filled 2014-12-04: qty 4

## 2014-12-04 MED ORDER — DEXTROSE 5 % IV SOLN
20.0000 mg/m2 | Freq: Once | INTRAVENOUS | Status: AC
  Administered 2014-12-04: 36 mg via INTRAVENOUS
  Filled 2014-12-04: qty 18

## 2014-12-04 NOTE — Telephone Encounter (Signed)
Pt notified that Revlimid dose was decreased to 15 mg due to low platelet count

## 2014-12-04 NOTE — Patient Instructions (Signed)
[facility] Discharge Instructions for Patients Receiving Chemotherapy  Today you received the following chemotherapy agents Kyprolis.  To help prevent nausea and vomiting after your treatment, we encourage you to take your nausea medication as prescribed.   If you develop nausea and vomiting that is not controlled by your nausea medication, call the clinic.   BELOW ARE SYMPTOMS THAT SHOULD BE REPORTED IMMEDIATELY:  *FEVER GREATER THAN 100.5 F  *CHILLS WITH OR WITHOUT FEVER  NAUSEA AND VOMITING THAT IS NOT CONTROLLED WITH YOUR NAUSEA MEDICATION  *UNUSUAL SHORTNESS OF BREATH  *UNUSUAL BRUISING OR BLEEDING  TENDERNESS IN MOUTH AND THROAT WITH OR WITHOUT PRESENCE OF ULCERS  *URINARY PROBLEMS  *BOWEL PROBLEMS  UNUSUAL RASH Items with * indicate a potential emergency and should be followed up as soon as possible.  Feel free to call the clinic you have any questions or concerns. The clinic phone number is [phone].  Please show the CHEMO ALERT CARD at check-in to the Emergency Department and triage nurse.   

## 2014-12-07 NOTE — Telephone Encounter (Signed)
[facility] faxed confirmation of facsimile receipt for Revlimid prescription referral.  [facility] will verify insurance and make delivery arrangements with patient. 

## 2014-12-08 LAB — KAPPA/LAMBDA LIGHT CHAINS
Kappa free light chain: 0.9 mg/dL (ref 0.33–1.94)
Kappa:Lambda Ratio: 0.17 — ABNORMAL LOW (ref 0.26–1.65)
Lambda Free Lght Chn: 5.43 mg/dL — ABNORMAL HIGH (ref 0.57–2.63)

## 2014-12-08 LAB — SPEP & IFE WITH QIG
Abnormal Protein Band1: 0.2 g/dL
Albumin ELP: 3.7 g/dL — ABNORMAL LOW (ref 3.8–4.8)
Alpha-1-Globulin: 0.3 g/dL (ref 0.2–0.3)
Alpha-2-Globulin: 0.5 g/dL (ref 0.5–0.9)
Beta 2: 0.5 g/dL (ref 0.2–0.5)
Beta Globulin: 0.4 g/dL (ref 0.4–0.6)
Gamma Globulin: 0.5 g/dL — ABNORMAL LOW (ref 0.8–1.7)
IgA: 305 mg/dL (ref 69–380)
IgG (Immunoglobin G), Serum: 523 mg/dL — ABNORMAL LOW (ref 690–1700)
IgM, Serum: 12 mg/dL — ABNORMAL LOW (ref 52–322)
Total Protein, Serum Electrophoresis: 5.8 g/dL — ABNORMAL LOW (ref 6.1–8.1)

## 2014-12-08 LAB — BETA 2 MICROGLOBULIN, SERUM: Beta-2 Microglobulin: 2.94 mg/L — ABNORMAL HIGH (ref <=2.51)

## 2014-12-09 ENCOUNTER — Telehealth: Payer: Self-pay | Admitting: *Deleted

## 2014-12-09 NOTE — Telephone Encounter (Signed)
TC received from pt regarding her results of Myeloma labs drawn last Thursday.  Pt is anxious and does not want to wait until next Tuesday when she sees MD for results.

## 2014-12-09 NOTE — Telephone Encounter (Signed)
You can give her the results but please reserve explanation until I see her

## 2014-12-09 NOTE — Telephone Encounter (Signed)
Reviewed results with pt.  Instructed pt that Dr. Alvy Bimler will review results with her at next appt.

## 2014-12-10 NOTE — Telephone Encounter (Signed)
Patient called regarding her Revlimid prescription.  Let her know that on 12/07/14 we have a note that [facility] confirmed receiving rx.   Note says they will verify insurance and make delivery arrangements with patient.  Patient states she will try and get hold of [employer].  She appreciated the information.

## 2014-12-14 ENCOUNTER — Telehealth: Payer: Self-pay

## 2014-12-14 NOTE — Telephone Encounter (Signed)
Confirmation of shipment of revlimid. Ship date 12/11/14

## 2014-12-15 ENCOUNTER — Ambulatory Visit (HOSPITAL_BASED_OUTPATIENT_CLINIC_OR_DEPARTMENT_OTHER): Payer: Medicare HMO | Admitting: Hematology and Oncology

## 2014-12-15 ENCOUNTER — Ambulatory Visit (HOSPITAL_BASED_OUTPATIENT_CLINIC_OR_DEPARTMENT_OTHER): Payer: Medicare HMO

## 2014-12-15 ENCOUNTER — Telehealth: Payer: Self-pay | Admitting: Hematology and Oncology

## 2014-12-15 ENCOUNTER — Encounter: Payer: Self-pay | Admitting: Hematology and Oncology

## 2014-12-15 ENCOUNTER — Other Ambulatory Visit: Payer: Self-pay | Admitting: Hematology and Oncology

## 2014-12-15 ENCOUNTER — Other Ambulatory Visit (HOSPITAL_BASED_OUTPATIENT_CLINIC_OR_DEPARTMENT_OTHER): Payer: Medicare HMO

## 2014-12-15 ENCOUNTER — Ambulatory Visit: Payer: Medicare HMO

## 2014-12-15 VITALS — BP 151/48 | HR 67 | Temp 98.9°F | Resp 18 | Ht 62.0 in | Wt 158.4 lb

## 2014-12-15 DIAGNOSIS — D72819 Decreased white blood cell count, unspecified: Secondary | ICD-10-CM

## 2014-12-15 DIAGNOSIS — C9000 Multiple myeloma not having achieved remission: Secondary | ICD-10-CM

## 2014-12-15 DIAGNOSIS — D630 Anemia in neoplastic disease: Secondary | ICD-10-CM

## 2014-12-15 DIAGNOSIS — T451X5A Adverse effect of antineoplastic and immunosuppressive drugs, initial encounter: Secondary | ICD-10-CM

## 2014-12-15 DIAGNOSIS — Z9481 Bone marrow transplant status: Secondary | ICD-10-CM

## 2014-12-15 DIAGNOSIS — D701 Agranulocytosis secondary to cancer chemotherapy: Secondary | ICD-10-CM

## 2014-12-15 DIAGNOSIS — D6959 Other secondary thrombocytopenia: Secondary | ICD-10-CM | POA: Diagnosis not present

## 2014-12-15 DIAGNOSIS — Z5112 Encounter for antineoplastic immunotherapy: Secondary | ICD-10-CM

## 2014-12-15 DIAGNOSIS — T50905A Adverse effect of unspecified drugs, medicaments and biological substances, initial encounter: Secondary | ICD-10-CM

## 2014-12-15 DIAGNOSIS — Z95828 Presence of other vascular implants and grafts: Secondary | ICD-10-CM

## 2014-12-15 LAB — CBC WITH DIFFERENTIAL/PLATELET
BASO%: 0.8 % (ref 0.0–2.0)
Basophils Absolute: 0 10*3/uL (ref 0.0–0.1)
EOS%: 5.5 % (ref 0.0–7.0)
Eosinophils Absolute: 0.2 10*3/uL (ref 0.0–0.5)
HCT: 30.7 % — ABNORMAL LOW (ref 34.8–46.6)
HGB: 10.5 g/dL — ABNORMAL LOW (ref 11.6–15.9)
LYMPH%: 40.6 % (ref 14.0–49.7)
MCH: 32 pg (ref 25.1–34.0)
MCHC: 34.4 g/dL (ref 31.5–36.0)
MCV: 93 fL (ref 79.5–101.0)
MONO#: 0.5 10*3/uL (ref 0.1–0.9)
MONO%: 16.6 % — ABNORMAL HIGH (ref 0.0–14.0)
NEUT#: 1.1 10*3/uL — ABNORMAL LOW (ref 1.5–6.5)
NEUT%: 36.5 % — ABNORMAL LOW (ref 38.4–76.8)
Platelets: 60 10*3/uL — ABNORMAL LOW (ref 145–400)
RBC: 3.3 10*6/uL — ABNORMAL LOW (ref 3.70–5.45)
RDW: 16.1 % — ABNORMAL HIGH (ref 11.2–14.5)
WBC: 3.1 10*3/uL — ABNORMAL LOW (ref 3.9–10.3)
lymph#: 1.3 10*3/uL (ref 0.9–3.3)

## 2014-12-15 LAB — COMPREHENSIVE METABOLIC PANEL (CC13)
ALT: 10 U/L (ref 0–55)
AST: 13 U/L (ref 5–34)
Albumin: 3.7 g/dL (ref 3.5–5.0)
Alkaline Phosphatase: 45 U/L (ref 40–150)
Anion Gap: 5 mEq/L (ref 3–11)
BUN: 11.3 mg/dL (ref 7.0–26.0)
CO2: 27 mEq/L (ref 22–29)
Calcium: 9.1 mg/dL (ref 8.4–10.4)
Chloride: 108 mEq/L (ref 98–109)
Creatinine: 0.6 mg/dL (ref 0.6–1.1)
EGFR: >90 mL/min/{1.73_m2} (ref >90)
Glucose: 86 mg/dl (ref 70–140)
Potassium: 4.2 mEq/L (ref 3.5–5.1)
Sodium: 140 mEq/L (ref 136–145)
Total Bilirubin: 0.8 mg/dL (ref 0.20–1.20)
Total Protein: 5.8 g/dL — ABNORMAL LOW (ref 6.4–8.3)

## 2014-12-15 MED ORDER — DEXAMETHASONE SODIUM PHOSPHATE 100 MG/10ML IJ SOLN
Freq: Once | INTRAMUSCULAR | Status: AC
  Administered 2014-12-15: 12:00:00 via INTRAVENOUS
  Filled 2014-12-15: qty 4

## 2014-12-15 MED ORDER — SODIUM CHLORIDE 0.9 % IJ SOLN
10.0000 mL | INTRAMUSCULAR | Status: DC | PRN
  Administered 2014-12-15: 10 mL
  Filled 2014-12-15: qty 10

## 2014-12-15 MED ORDER — SODIUM CHLORIDE 0.9 % IJ SOLN
10.0000 mL | INTRAMUSCULAR | Status: DC | PRN
  Administered 2014-12-15: 10 mL via INTRAVENOUS
  Filled 2014-12-15: qty 10

## 2014-12-15 MED ORDER — HEPARIN SOD (PORK) LOCK FLUSH 100 UNIT/ML IV SOLN
500.0000 [IU] | Freq: Once | INTRAVENOUS | Status: AC | PRN
  Administered 2014-12-15: 500 [IU]
  Filled 2014-12-15: qty 5

## 2014-12-15 MED ORDER — SODIUM CHLORIDE 0.9 % IV SOLN
250.0000 mL | Freq: Once | INTRAVENOUS | Status: AC
  Administered 2014-12-15: 250 mL via INTRAVENOUS

## 2014-12-15 MED ORDER — DEXTROSE 5 % IV SOLN
20.0000 mg/m2 | Freq: Once | INTRAVENOUS | Status: AC
  Administered 2014-12-15: 36 mg via INTRAVENOUS
  Filled 2014-12-15: qty 18

## 2014-12-15 MED ORDER — SODIUM CHLORIDE 0.9 % IV SOLN: 250.0000 mL | Freq: Once | INTRAVENOUS | Status: DC

## 2014-12-15 NOTE — Patient Instructions (Signed)

## 2014-12-15 NOTE — Assessment & Plan Note (Signed)
This is likely due to recent treatment. The patient denies recent history of bleeding such as epistaxis, hematuria or hematochezia. She is asymptomatic from the low platelet count. I will observe for now.  Plan to continue Kyprolis as long as platelet count is > 50,000. Hold Revlimid and resume if > 75,000

## 2014-12-15 NOTE — Assessment & Plan Note (Signed)
This is likely due to recent treatment. The patient denies recent history of bleeding such as epistaxis, hematuria or hematochezia. She is asymptomatic from the anemia. I will observe for now.   

## 2014-12-15 NOTE — Telephone Encounter (Signed)
Added appts....the patient will get sched in chemo °

## 2014-12-15 NOTE — Assessment & Plan Note (Signed)
She is doing well posttransplant. She will receive posttransplant vaccination in July and I will administer that for her. She will continue prophylactic antimicrobial therapy.

## 2014-12-15 NOTE — Assessment & Plan Note (Signed)
This is likely due to recent treatment. The patient denies recent history of fevers, cough, chills, diarrhea or dysuria. She is asymptomatic from the leukopenia. I will observe for now.  I will hold Revlimid and resume it next week if ANC is >1500

## 2014-12-15 NOTE — Progress Notes (Signed)
Per Dr. Alvy Bimler, it's OK to treat today despite today's counts. This message was given to the RN in the infusion room.

## 2014-12-15 NOTE — Assessment & Plan Note (Signed)
She is tolerating the treatment well with expected mild pancytopenia related to treatment. I recommend we continue the same treatment with hyper SPEP to hold Revlimid.

## 2014-12-15 NOTE — Progress Notes (Signed)
Dr. Alvy Bimler reviewed labs today, okay to proceed with treatment.

## 2014-12-15 NOTE — Patient Instructions (Signed)
[facility] Discharge Instructions for Patients Receiving Chemotherapy  Today you received the following chemotherapy agents: Kyprolis   To help prevent nausea and vomiting after your treatment, we encourage you to take your nausea medication as directed.    If you develop nausea and vomiting that is not controlled by your nausea medication, call the clinic.   BELOW ARE SYMPTOMS THAT SHOULD BE REPORTED IMMEDIATELY:  *FEVER GREATER THAN 100.5 F  *CHILLS WITH OR WITHOUT FEVER  NAUSEA AND VOMITING THAT IS NOT CONTROLLED WITH YOUR NAUSEA MEDICATION  *UNUSUAL SHORTNESS OF BREATH  *UNUSUAL BRUISING OR BLEEDING  TENDERNESS IN MOUTH AND THROAT WITH OR WITHOUT PRESENCE OF ULCERS  *URINARY PROBLEMS  *BOWEL PROBLEMS  UNUSUAL RASH Items with * indicate a potential emergency and should be followed up as soon as possible.  Feel free to call the clinic you have any questions or concerns. The clinic phone number is [phone].  Please show the CHEMO ALERT CARD at check-in to the Emergency Department and triage nurse.   

## 2014-12-15 NOTE — Progress Notes (Signed)
[facility] OFFICE PROGRESS NOTE  Patient Care Team: Jinny Sanders, MD as PCP - General (Family Medicine) [name], MD as Referring Physician (Hematology and Oncology) [name], MD as Consulting Physician (Internal Medicine)  SUMMARY OF ONCOLOGIC HISTORY: Oncology History   Multiple myeloma, Ig A Lambda, M spike 3.54 grams, Calcium 9.2, Creatinine 0.8, Beta 2 microglobulin 4.52, IgA 4840 mg/dL, lambda light chain 75.4, albumin 3.6, hemoglobin 9.7, platelet 115    Primary site: Multiple Myeloma   Staging method: AJCC 6th Edition   Clinical: Stage IIA signed by [name], MD on 11/07/2013  2:46 PM   Summary: Stage IIA        Multiple myeloma   10/31/2013 Bone Marrow Biopsy Bone marrow biopsy confirmed multiple myeloma with 40% bone marrow involvement. Skeletal survey showed minimal lesions in her score with generalized demineralization   11/10/2013 - 02/13/2014 Chemotherapy The patient is started on induction chemotherapy with weekly dexamethasone 40 mg by mouth as well as Velcade subcutaneous injection on days 1, 4, 8 and 11. On 11/21/2013, she was started on monthly Zometa.   12/23/2013 Adverse Reaction The dose of Velcade was reduced due to thrombocytopenia.   01/28/2014 - 04/07/2014 Chemotherapy Revlimid is added. Treatment was discontinued due to lack of response.   02/24/2014 - 04/07/2014 Chemotherapy Due to worsening peripheral neuropathy, Velcade injection is changed to once a week. Revlimid was given 21 days on, 7 days off.   04/07/2014 - 04/10/2014 Chemotherapy Revlimid was discontinued due to lack of response. Chemotherapy was changed back to Velcade injection twice a week, 2 weeks on 1 week off. Her treatment was switched to to minimum response   04/20/2014 - 06/02/2014 Chemotherapy chemotherapy is switched to Carfilzomib, Cytoxan and dexamethasone.   04/22/2014 Procedure she has placement of port for chemotherapy.   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 She returns for further follow-up. Her recent development treatment was on hold due to significant thrombocytopenia. She is not symptomatic. The patient denies any recent signs or symptoms of bleeding such as spontaneous epistaxis, hematuria or hematochezia.  REVIEW OF SYSTEMS:   Constitutional: Denies fevers, chills or abnormal weight loss Eyes: Denies blurriness of vision Ears, nose, mouth, throat, and face: Denies mucositis or sore throat Respiratory: Denies cough, dyspnea or wheezes Cardiovascular: Denies palpitation, chest discomfort or lower extremity swelling Gastrointestinal:  Denies nausea, heartburn or change in bowel habits Skin: Denies abnormal skin rashes Lymphatics: Denies new lymphadenopathy or easy bruising Neurological:Denies numbness, tingling or new weaknesses Behavioral/Psych: Mood is stable, no new changes  All other systems were reviewed with the patient and are negative.  I have reviewed the past medical history, past surgical history, social history and family history with the patient and they are unchanged from previous note.  ALLERGIES:  is allergic to penicillins and sulfa antibiotics.  MEDICATIONS:  Current Outpatient Prescriptions  Medication Sig Dispense Refill  . acetaminophen (TYLENOL) 500 MG tablet Take 1,500 mg by mouth once.    Marland Kitchen acyclovir (ZOVIRAX) 400 MG tablet  Take 1 tablet (400 mg total) by mouth 2 (two) times daily. 180 tablet 3  . calcium  carbonate (TUMS EX) 750 MG chewable tablet Chew 1 tablet by mouth daily.     . carvedilol (COREG) 6.25 MG tablet Take 3.125 mg by mouth 2 (two) times daily with a meal.    . cholecalciferol (VITAMIN D) 1000 UNITS tablet Take 1,000 Units by mouth daily.    Marland Kitchen docusate sodium (COLACE) 100 MG capsule Take 100 mg by mouth 2 (two) times daily.    Marland Kitchen lenalidomide (REVLIMID) 15 MG capsule Take 1 capsule (15 mg total) by mouth daily. 21 capsule 0  . loperamide (IMODIUM A-D) 2 MG tablet Take 2 mg by mouth 4 (four) times daily as needed for diarrhea or loose stools.    . mirtazapine (REMERON) 30 MG tablet TAKE 1 TABLET BY MOUTH AT BEDTIME (Patient taking differently: TAKE 1/2 TABLET BY MOUTH AT BEDTIME) 60 tablet 1  . Multiple Vitamin (MULTIVITAMIN WITH MINERALS) TABS tablet Take 1 tablet by mouth daily.    . ondansetron (ZOFRAN) 8 MG tablet Take 1 tablet (8 mg total) by mouth 2 (two) times daily. Start the day after chemo for 2 days. Then as needed for nausea or vomiting. 30 tablet 1   No current facility-administered medications for this visit.   Facility-Administered Medications Ordered in Other Visits  Medication Dose Route Frequency Provider Last Rate Last Dose  . 0.9 %  sodium chloride infusion  250 mL Intravenous Once [name], MD   250 mL at 12/15/14 1146  . carfilzomib (KYPROLIS) 36 mg in dextrose 5 % 50 mL chemo infusion  20 mg/m2 (Treatment Plan Actual) Intravenous Once [name], MD 136 mL/hr at 12/15/14 1223 36 mg at 12/15/14 1223  . heparin lock flush 100 unit/mL  500 Units Intracatheter Once PRN [name], MD      . sodium chloride 0.9 % injection 10 mL  10 mL Intravenous PRN [name], MD   10 mL at 12/15/14 1037  . sodium chloride 0.9 % injection 10 mL  10 mL Intracatheter PRN [name], MD        PHYSICAL EXAMINATION: ECOG PERFORMANCE STATUS: 0 - Asymptomatic  Filed Vitals:   12/15/14 1103  BP: 151/48  Pulse: 67  Temp: 98.9 F (37.2 C)  Resp: 18   Filed Weights   12/15/14  1103  Weight: 158 lb 6.4 oz (71.85 kg)    GENERAL:alert, no distress and comfortable SKIN: skin color, texture, turgor are normal, no rashes or significant lesions EYES: normal, Conjunctiva are pink and non-injected, sclera clear OROPHARYNX:no exudate, no erythema and lips, buccal mucosa, and tongue normal  NECK: supple, thyroid normal size, non-tender, without nodularity LYMPH:  no palpable lymphadenopathy in the cervical, axillary or inguinal LUNGS: clear to auscultation and percussion with normal breathing effort HEART: regular rate & rhythm and no murmurs and no lower extremity edema ABDOMEN:abdomen soft, non-tender and normal bowel sounds Musculoskeletal:no cyanosis of digits and no clubbing  NEURO: alert & oriented x 3 with fluent speech, no focal motor/sensory deficits  LABORATORY DATA:  I have reviewed the data as listed    Component Value Date/Time   NA 140 12/15/2014 0956   NA 139 10/22/2014 0750   K 4.2 12/15/2014 0956   K 4.0 10/22/2014 0750   CL 109 10/22/2014 0750   CO2 27 12/15/2014 0956   CO2 23 10/22/2014 0750   GLUCOSE 86 12/15/2014 0956   GLUCOSE 113* 10/22/2014 0750  BUN 11.3 12/15/2014 0956   BUN 13 10/22/2014 0750   CREATININE 0.6 12/15/2014 0956   CREATININE 0.68 10/22/2014 0750   CALCIUM 9.1 12/15/2014 0956   CALCIUM 8.8 10/22/2014 0750   PROT 5.8* 12/15/2014 0956   PROT 8.5* 02/03/2014 0909   ALBUMIN 3.7 12/15/2014 0956   ALBUMIN 3.5 02/03/2014 0909   AST 13 12/15/2014 0956   AST 14 02/03/2014 0909   ALT 10 12/15/2014 0956   ALT 12 02/03/2014 0909   ALKPHOS 45 12/15/2014 0956   ALKPHOS 53 02/03/2014 0909   BILITOT 0.80 12/15/2014 0956   BILITOT <0.2* 02/03/2014 0909   GFRNONAA 88* 10/22/2014 0750   GFRAA >90 10/22/2014 0750    No results found for: SPEP, UPEP  Lab Results  Component Value Date   WBC 3.1* 12/15/2014   NEUTROABS 1.1* 12/15/2014   HGB 10.5* 12/15/2014   HCT 30.7* 12/15/2014   MCV 93.0 12/15/2014   PLT 60* 12/15/2014       Chemistry      Component Value Date/Time   NA 140 12/15/2014 0956   NA 139 10/22/2014 0750   K 4.2 12/15/2014 0956   K 4.0 10/22/2014 0750   CL 109 10/22/2014 0750   CO2 27 12/15/2014 0956   CO2 23 10/22/2014 0750   BUN 11.3 12/15/2014 0956   BUN 13 10/22/2014 0750   CREATININE 0.6 12/15/2014 0956   CREATININE 0.68 10/22/2014 0750      Component Value Date/Time   CALCIUM 9.1 12/15/2014 0956   CALCIUM 8.8 10/22/2014 0750   ALKPHOS 45 12/15/2014 0956   ALKPHOS 53 02/03/2014 0909   AST 13 12/15/2014 0956   AST 14 02/03/2014 0909   ALT 10 12/15/2014 0956   ALT 12 02/03/2014 0909   BILITOT 0.80 12/15/2014 0956   BILITOT <0.2* 02/03/2014 0909     ASSESSMENT & PLAN:  Multiple myeloma She is tolerating the treatment well with expected mild pancytopenia related to treatment. I recommend we hold Revlimid for now but to proceed with Kyprolis. 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 The patient denies recent history of bleeding such as epistaxis, hematuria or hematochezia. She is asymptomatic from the anemia. I will observe for now.   Leukopenia due to antineoplastic chemotherapy This is likely due to recent treatment. The patient denies recent history of fevers, cough, chills, diarrhea or dysuria. She is asymptomatic from the leukopenia. I will observe for now.  I will hold Revlimid and resume it next week if ANC is >1500    Thrombocytopenia due to drugs This is likely due to recent treatment. The patient denies recent history of bleeding such as epistaxis, hematuria or hematochezia. She is asymptomatic from the low platelet count. I will observe for now.  Plan to continue Kyprolis as long as platelet count is > 50,000. 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 She will receive posttransplant vaccination in July and I  will administer that for her. She will continue prophylactic antimicrobial therapy.     No orders of the defined types were placed in this encounter.   All questions were answered. The patient knows to call the clinic with any problems, questions or concerns. No barriers to learning was detected. I spent 30 minutes counseling the patient face to face. The total time spent in the appointment was 40 minutes  and more than 50% was on counseling and review of test results     [facility], Nydia Ytuarte, MD 12/15/2014 12:43 PM

## 2014-12-15 NOTE — Assessment & Plan Note (Signed)
She is tolerating the treatment well with expected mild pancytopenia related to treatment. I recommend we hold Revlimid for now but to proceed with Kyprolis. I will resume Zometa every 3 months starting next week

## 2014-12-16 ENCOUNTER — Ambulatory Visit (HOSPITAL_BASED_OUTPATIENT_CLINIC_OR_DEPARTMENT_OTHER): Payer: Medicare HMO

## 2014-12-16 ENCOUNTER — Telehealth: Payer: Self-pay | Admitting: Hematology and Oncology

## 2014-12-16 VITALS — BP 136/63 | HR 73 | Temp 98.7°F

## 2014-12-16 DIAGNOSIS — Z5112 Encounter for antineoplastic immunotherapy: Secondary | ICD-10-CM | POA: Diagnosis not present

## 2014-12-16 DIAGNOSIS — C9000 Multiple myeloma not having achieved remission: Secondary | ICD-10-CM

## 2014-12-16 MED ORDER — SODIUM CHLORIDE 0.9 % IJ SOLN
10.0000 mL | INTRAMUSCULAR | Status: DC | PRN
Start: 2014-12-16 — End: 2014-12-16
  Administered 2014-12-16: 10 mL
  Filled 2014-12-16: qty 10

## 2014-12-16 MED ORDER — HEPARIN SOD (PORK) LOCK FLUSH 100 UNIT/ML IV SOLN
500.0000 [IU] | Freq: Once | INTRAVENOUS | Status: AC | PRN
  Administered 2014-12-16: 500 [IU]
  Filled 2014-12-16: qty 5

## 2014-12-16 MED ORDER — DEXTROSE 5 % IV SOLN
20.0000 mg/m2 | Freq: Once | INTRAVENOUS | Status: AC
  Administered 2014-12-16: 36 mg via INTRAVENOUS
  Filled 2014-12-16: qty 18

## 2014-12-16 MED ORDER — SODIUM CHLORIDE 0.9 % IV SOLN
Freq: Once | INTRAVENOUS | Status: AC
  Administered 2014-12-16: 12:00:00 via INTRAVENOUS

## 2014-12-16 MED ORDER — SODIUM CHLORIDE 0.9 % IV SOLN
Freq: Once | INTRAVENOUS | Status: AC
  Administered 2014-12-16: 12:00:00 via INTRAVENOUS
  Filled 2014-12-16: qty 4

## 2014-12-16 MED ORDER — SODIUM CHLORIDE 0.9 % IV SOLN: Freq: Once | INTRAVENOUS | Status: DC

## 2014-12-16 NOTE — Patient Instructions (Signed)
[facility] Discharge Instructions for Patients Receiving Chemotherapy  Today you received the following chemotherapy agents Kyprolis.  To help prevent nausea and vomiting after your treatment, we encourage you to take your nausea medication as prescribed.   If you develop nausea and vomiting that is not controlled by your nausea medication, call the clinic.   BELOW ARE SYMPTOMS THAT SHOULD BE REPORTED IMMEDIATELY:  *FEVER GREATER THAN 100.5 F  *CHILLS WITH OR WITHOUT FEVER  NAUSEA AND VOMITING THAT IS NOT CONTROLLED WITH YOUR NAUSEA MEDICATION  *UNUSUAL SHORTNESS OF BREATH  *UNUSUAL BRUISING OR BLEEDING  TENDERNESS IN MOUTH AND THROAT WITH OR WITHOUT PRESENCE OF ULCERS  *URINARY PROBLEMS  *BOWEL PROBLEMS  UNUSUAL RASH Items with * indicate a potential emergency and should be followed up as soon as possible.  Feel free to call the clinic you have any questions or concerns. The clinic phone number is [phone].  Please show the CHEMO ALERT CARD at check-in to the Emergency Department and triage nurse.   

## 2014-12-16 NOTE — Telephone Encounter (Signed)
returned pt call and s.w. pt and r/s the chemos to earlier times.Marland KitchenMarland KitchenMarland KitchenMarland Kitchenpt ok and aware of new d.t

## 2014-12-22 ENCOUNTER — Other Ambulatory Visit (HOSPITAL_BASED_OUTPATIENT_CLINIC_OR_DEPARTMENT_OTHER): Payer: Medicare HMO

## 2014-12-22 ENCOUNTER — Ambulatory Visit (HOSPITAL_BASED_OUTPATIENT_CLINIC_OR_DEPARTMENT_OTHER): Payer: Medicare HMO

## 2014-12-22 VITALS — BP 146/64 | HR 74 | Temp 98.9°F | Resp 16

## 2014-12-22 DIAGNOSIS — C9000 Multiple myeloma not having achieved remission: Secondary | ICD-10-CM

## 2014-12-22 DIAGNOSIS — Z5112 Encounter for antineoplastic immunotherapy: Secondary | ICD-10-CM

## 2014-12-22 LAB — CBC WITH DIFFERENTIAL/PLATELET
BASO%: 0.6 % (ref 0.0–2.0)
Basophils Absolute: 0 10*3/uL (ref 0.0–0.1)
EOS%: 5.1 % (ref 0.0–7.0)
Eosinophils Absolute: 0.2 10*3/uL (ref 0.0–0.5)
HCT: 35.3 % (ref 34.8–46.6)
HGB: 11.9 g/dL (ref 11.6–15.9)
LYMPH%: 31.9 % (ref 14.0–49.7)
MCH: 31.7 pg (ref 25.1–34.0)
MCHC: 33.8 g/dL (ref 31.5–36.0)
MCV: 94 fL (ref 79.5–101.0)
MONO#: 0.6 10*3/uL (ref 0.1–0.9)
MONO%: 16.6 % — ABNORMAL HIGH (ref 0.0–14.0)
NEUT#: 1.5 10*3/uL (ref 1.5–6.5)
NEUT%: 45.8 % (ref 38.4–76.8)
Platelets: 54 10*3/uL — ABNORMAL LOW (ref 145–400)
RBC: 3.75 10*6/uL (ref 3.70–5.45)
RDW: 16.8 % — ABNORMAL HIGH (ref 11.2–14.5)
WBC: 3.3 10*3/uL — ABNORMAL LOW (ref 3.9–10.3)
lymph#: 1.1 10*3/uL (ref 0.9–3.3)

## 2014-12-22 LAB — COMPREHENSIVE METABOLIC PANEL (CC13)
ALT: 8 U/L (ref 0–55)
AST: 14 U/L (ref 5–34)
Albumin: 4 g/dL (ref 3.5–5.0)
Alkaline Phosphatase: 47 U/L (ref 40–150)
Anion Gap: 5 mEq/L (ref 3–11)
BUN: 12.6 mg/dL (ref 7.0–26.0)
CO2: 28 mEq/L (ref 22–29)
Calcium: 9.7 mg/dL (ref 8.4–10.4)
Chloride: 106 mEq/L (ref 98–109)
Creatinine: 0.7 mg/dL (ref 0.6–1.1)
EGFR: 87 mL/min/{1.73_m2} — ABNORMAL LOW (ref >90)
Glucose: 92 mg/dl (ref 70–140)
Potassium: 4.6 mEq/L (ref 3.5–5.1)
Sodium: 139 mEq/L (ref 136–145)
Total Bilirubin: 0.97 mg/dL (ref 0.20–1.20)
Total Protein: 6.4 g/dL (ref 6.4–8.3)

## 2014-12-22 MED ORDER — SODIUM CHLORIDE 0.9 % IV SOLN
Freq: Once | INTRAVENOUS | Status: AC
  Administered 2014-12-22: 12:00:00 via INTRAVENOUS
  Filled 2014-12-22: qty 4

## 2014-12-22 MED ORDER — SODIUM CHLORIDE 0.9 % IV SOLN
Freq: Once | INTRAVENOUS | Status: AC
  Administered 2014-12-22: 12:00:00 via INTRAVENOUS

## 2014-12-22 MED ORDER — HEPARIN SOD (PORK) LOCK FLUSH 100 UNIT/ML IV SOLN
500.0000 [IU] | Freq: Once | INTRAVENOUS | Status: AC | PRN
  Administered 2014-12-22: 500 [IU]
  Filled 2014-12-22: qty 5

## 2014-12-22 MED ORDER — DEXTROSE 5 % IV SOLN
20.0000 mg/m2 | Freq: Once | INTRAVENOUS | Status: AC
  Administered 2014-12-22: 36 mg via INTRAVENOUS
  Filled 2014-12-22: qty 18

## 2014-12-22 MED ORDER — SODIUM CHLORIDE 0.9 % IV SOLN: Freq: Once | INTRAVENOUS | Status: DC

## 2014-12-22 MED ORDER — SODIUM CHLORIDE 0.9 % IJ SOLN
10.0000 mL | INTRAMUSCULAR | Status: DC | PRN
  Administered 2014-12-22: 10 mL
  Filled 2014-12-22: qty 10

## 2014-12-22 MED ORDER — ZOLEDRONIC ACID 4 MG/100ML IV SOLN
4.0000 mg | Freq: Once | INTRAVENOUS | Status: AC
  Administered 2014-12-22: 4 mg via INTRAVENOUS
  Filled 2014-12-22: qty 100

## 2014-12-22 NOTE — Patient Instructions (Signed)
[facility] Discharge Instructions for Patients Receiving Chemotherapy  Today you received the following chemotherapy agents Kyprolis.  To help prevent nausea and vomiting after your treatment, we encourage you to take your nausea medication as prescribed.   If you develop nausea and vomiting that is not controlled by your nausea medication, call the clinic.   BELOW ARE SYMPTOMS THAT SHOULD BE REPORTED IMMEDIATELY:  *FEVER GREATER THAN 100.5 F  *CHILLS WITH OR WITHOUT FEVER  NAUSEA AND VOMITING THAT IS NOT CONTROLLED WITH YOUR NAUSEA MEDICATION  *UNUSUAL SHORTNESS OF BREATH  *UNUSUAL BRUISING OR BLEEDING  TENDERNESS IN MOUTH AND THROAT WITH OR WITHOUT PRESENCE OF ULCERS  *URINARY PROBLEMS  *BOWEL PROBLEMS  UNUSUAL RASH Items with * indicate a potential emergency and should be followed up as soon as possible.  Feel free to call the clinic you have any questions or concerns. The clinic phone number is [phone].  Please show the CHEMO ALERT CARD at check-in to the Emergency Department and triage nurse.   

## 2014-12-22 NOTE — Progress Notes (Signed)
Ok to treat w/ Kyprolis today and tomorrow with low platelet count 54.   Continue to hold Revlimid.  Per Dr. Alvy Bimler.

## 2014-12-23 ENCOUNTER — Other Ambulatory Visit: Payer: Self-pay

## 2014-12-23 ENCOUNTER — Ambulatory Visit: Payer: Medicare HMO

## 2014-12-23 ENCOUNTER — Encounter: Payer: Self-pay | Admitting: Hematology and Oncology

## 2014-12-23 ENCOUNTER — Ambulatory Visit (HOSPITAL_BASED_OUTPATIENT_CLINIC_OR_DEPARTMENT_OTHER): Payer: Medicare HMO

## 2014-12-23 VITALS — BP 145/67 | HR 73 | Temp 98.9°F

## 2014-12-23 DIAGNOSIS — Z5112 Encounter for antineoplastic immunotherapy: Secondary | ICD-10-CM

## 2014-12-23 DIAGNOSIS — C9000 Multiple myeloma not having achieved remission: Secondary | ICD-10-CM | POA: Diagnosis not present

## 2014-12-23 MED ORDER — HEPARIN SOD (PORK) LOCK FLUSH 100 UNIT/ML IV SOLN
500.0000 [IU] | Freq: Once | INTRAVENOUS | Status: AC | PRN
  Administered 2014-12-23: 500 [IU]
  Filled 2014-12-23: qty 5

## 2014-12-23 MED ORDER — CARFILZOMIB CHEMO INJECTION 60 MG
20.0000 mg/m2 | Freq: Once | INTRAVENOUS | Status: AC
  Administered 2014-12-23: 36 mg via INTRAVENOUS
  Filled 2014-12-23: qty 18

## 2014-12-23 MED ORDER — SODIUM CHLORIDE 0.9 % IV SOLN
Freq: Once | INTRAVENOUS | Status: AC
  Administered 2014-12-23: 09:00:00 via INTRAVENOUS
  Filled 2014-12-23: qty 4

## 2014-12-23 MED ORDER — SODIUM CHLORIDE 0.9 % IJ SOLN
10.0000 mL | INTRAMUSCULAR | Status: DC | PRN
  Administered 2014-12-23: 10 mL
  Filled 2014-12-23: qty 10

## 2014-12-23 MED ORDER — SODIUM CHLORIDE 0.9 % IV SOLN
250.0000 mL | Freq: Once | INTRAVENOUS | Status: AC
  Administered 2014-12-23: 250 mL via INTRAVENOUS

## 2014-12-23 MED ORDER — CARVEDILOL 6.25 MG PO TABS: 3.1250 mg | ORAL_TABLET | Freq: Two times a day (BID) | ORAL | Status: DC

## 2014-12-23 NOTE — Telephone Encounter (Signed)
Refill sent for carvedilol 6.25 mg take 0.5 tablets twice a day.

## 2014-12-23 NOTE — Patient Instructions (Signed)
[facility] Discharge Instructions for Patients Receiving Chemotherapy  Today you received the following chemotherapy agents kyprolis  To help prevent nausea and vomiting after your treatment, we encourage you to take your nausea medication as directed   If you develop nausea and vomiting that is not controlled by your nausea medication, call the clinic.   BELOW ARE SYMPTOMS THAT SHOULD BE REPORTED IMMEDIATELY:  *FEVER GREATER THAN 100.5 F  *CHILLS WITH OR WITHOUT FEVER  NAUSEA AND VOMITING THAT IS NOT CONTROLLED WITH YOUR NAUSEA MEDICATION  *UNUSUAL SHORTNESS OF BREATH  *UNUSUAL BRUISING OR BLEEDING  TENDERNESS IN MOUTH AND THROAT WITH OR WITHOUT PRESENCE OF ULCERS  *URINARY PROBLEMS  *BOWEL PROBLEMS  UNUSUAL RASH Items with * indicate a potential emergency and should be followed up as soon as possible.  Feel free to call the clinic you have any questions or concerns. The clinic phone number is [phone].  

## 2014-12-29 ENCOUNTER — Other Ambulatory Visit: Payer: Self-pay | Admitting: *Deleted

## 2014-12-29 ENCOUNTER — Ambulatory Visit: Payer: Medicare HMO

## 2014-12-29 ENCOUNTER — Encounter: Payer: Self-pay | Admitting: General Practice

## 2014-12-29 ENCOUNTER — Other Ambulatory Visit (HOSPITAL_BASED_OUTPATIENT_CLINIC_OR_DEPARTMENT_OTHER): Payer: Medicare HMO

## 2014-12-29 ENCOUNTER — Encounter: Payer: Self-pay | Admitting: Hematology and Oncology

## 2014-12-29 ENCOUNTER — Telehealth: Payer: Self-pay | Admitting: Hematology and Oncology

## 2014-12-29 DIAGNOSIS — C9000 Multiple myeloma not having achieved remission: Secondary | ICD-10-CM | POA: Diagnosis not present

## 2014-12-29 LAB — COMPREHENSIVE METABOLIC PANEL (CC13)
ALT: 10 U/L (ref 0–55)
AST: 14 U/L (ref 5–34)
Albumin: 3.9 g/dL (ref 3.5–5.0)
Alkaline Phosphatase: 44 U/L (ref 40–150)
Anion Gap: 7 mEq/L (ref 3–11)
BUN: 17.4 mg/dL (ref 7.0–26.0)
CO2: 27 mEq/L (ref 22–29)
Calcium: 9.5 mg/dL (ref 8.4–10.4)
Chloride: 106 mEq/L (ref 98–109)
Creatinine: 0.7 mg/dL (ref 0.6–1.1)
EGFR: 89 mL/min/{1.73_m2} — ABNORMAL LOW (ref >90)
Glucose: 88 mg/dl (ref 70–140)
Potassium: 4.5 mEq/L (ref 3.5–5.1)
Sodium: 139 mEq/L (ref 136–145)
Total Bilirubin: 0.76 mg/dL (ref 0.20–1.20)
Total Protein: 6.2 g/dL — ABNORMAL LOW (ref 6.4–8.3)

## 2014-12-29 LAB — CBC WITH DIFFERENTIAL/PLATELET
BASO%: 0.3 % (ref 0.0–2.0)
Basophils Absolute: 0 10*3/uL (ref 0.0–0.1)
EOS%: 3.9 % (ref 0.0–7.0)
Eosinophils Absolute: 0.2 10*3/uL (ref 0.0–0.5)
HCT: 31.5 % — ABNORMAL LOW (ref 34.8–46.6)
HGB: 10.7 g/dL — ABNORMAL LOW (ref 11.6–15.9)
LYMPH%: 33.2 % (ref 14.0–49.7)
MCH: 32.2 pg (ref 25.1–34.0)
MCHC: 34 g/dL (ref 31.5–36.0)
MCV: 94.9 fL (ref 79.5–101.0)
MONO#: 0.4 10*3/uL (ref 0.1–0.9)
MONO%: 10.7 % (ref 0.0–14.0)
NEUT#: 2 10*3/uL (ref 1.5–6.5)
NEUT%: 51.9 % (ref 38.4–76.8)
Platelets: 30 10*3/uL — ABNORMAL LOW (ref 145–400)
RBC: 3.32 10*6/uL — ABNORMAL LOW (ref 3.70–5.45)
RDW: 16.4 % — ABNORMAL HIGH (ref 11.2–14.5)
WBC: 3.8 10*3/uL — ABNORMAL LOW (ref 3.9–10.3)
lymph#: 1.3 10*3/uL (ref 0.9–3.3)
nRBC: 0 % (ref 0–0)

## 2014-12-29 NOTE — Progress Notes (Signed)
Spiritual Care Note  Following Raiden for support through multiple myeloma support group.  Met with her in lobby today as she waiting for lab results.  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  Overall she was in good spirits and staying both positive and present, while also naming and honoring her physical needs and setbacks (such as today's report of continued low platelets).  She continues to cope by engaging life as much as she can.  If possible, she prefers morning appointments around 9:00.    Provided pastoral presence, reflective listening, and encouragement.  She is aware of ongoing chaplain availability, but please also page as needs arise. Thank you.  [place], [place] Pager [phone] VM [phone]

## 2014-12-29 NOTE — Telephone Encounter (Signed)
Gave and printed appt sched and avs for pt for July  °

## 2014-12-30 ENCOUNTER — Ambulatory Visit: Payer: Medicare HMO

## 2015-01-03 ENCOUNTER — Emergency Department (HOSPITAL_COMMUNITY)
Admission: EM | Admit: 2015-01-03 | Discharge: 2015-01-03 | Disposition: A | Discharge status: Home / Self Care | Payer: Medicare HMO | Attending: Emergency Medicine | Admitting: Emergency Medicine

## 2015-01-03 ENCOUNTER — Encounter (HOSPITAL_COMMUNITY): Payer: Self-pay | Admitting: *Deleted

## 2015-01-03 ENCOUNTER — Encounter: Payer: Self-pay | Admitting: Hematology and Oncology

## 2015-01-03 ENCOUNTER — Emergency Department (HOSPITAL_COMMUNITY): Payer: Medicare HMO

## 2015-01-03 DIAGNOSIS — Z880 Allergy status to penicillin: Secondary | ICD-10-CM | POA: Diagnosis not present

## 2015-01-03 DIAGNOSIS — J069 Acute upper respiratory infection, unspecified: Secondary | ICD-10-CM

## 2015-01-03 DIAGNOSIS — Z9484 Stem cells transplant status: Secondary | ICD-10-CM | POA: Insufficient documentation

## 2015-01-03 DIAGNOSIS — Z8739 Personal history of other diseases of the musculoskeletal system and connective tissue: Secondary | ICD-10-CM | POA: Insufficient documentation

## 2015-01-03 DIAGNOSIS — R000 Tachycardia, unspecified: Secondary | ICD-10-CM | POA: Diagnosis not present

## 2015-01-03 DIAGNOSIS — Z8579 Personal history of other malignant neoplasms of lymphoid, hematopoietic and related tissues: Secondary | ICD-10-CM | POA: Insufficient documentation

## 2015-01-03 DIAGNOSIS — Z87891 Personal history of nicotine dependence: Secondary | ICD-10-CM | POA: Diagnosis not present

## 2015-01-03 DIAGNOSIS — F419 Anxiety disorder, unspecified: Secondary | ICD-10-CM | POA: Diagnosis not present

## 2015-01-03 DIAGNOSIS — Z8719 Personal history of other diseases of the digestive system: Secondary | ICD-10-CM | POA: Diagnosis not present

## 2015-01-03 DIAGNOSIS — D649 Anemia, unspecified: Secondary | ICD-10-CM | POA: Insufficient documentation

## 2015-01-03 DIAGNOSIS — R509 Fever, unspecified: Secondary | ICD-10-CM | POA: Diagnosis not present

## 2015-01-03 DIAGNOSIS — I10 Essential (primary) hypertension: Secondary | ICD-10-CM | POA: Diagnosis not present

## 2015-01-03 DIAGNOSIS — Z79899 Other long term (current) drug therapy: Secondary | ICD-10-CM | POA: Insufficient documentation

## 2015-01-03 HISTORY — DX: Complications of stem cell transplant: T86.5

## 2015-01-03 LAB — CBC WITH DIFFERENTIAL/PLATELET
Basophils Absolute: 0 10*3/uL (ref 0.0–0.1)
Basophils Relative: 0 % (ref 0–1)
Eosinophils Absolute: 0.1 10*3/uL (ref 0.0–0.7)
Eosinophils Relative: 2 % (ref 0–5)
HCT: 27.1 % — ABNORMAL LOW (ref 36.0–46.0)
Hemoglobin: 9 g/dL — ABNORMAL LOW (ref 12.0–15.0)
Lymphocytes Relative: 22 % (ref 12–46)
Lymphs Abs: 1.1 10*3/uL (ref 0.7–4.0)
MCH: 32.4 pg (ref 26.0–34.0)
MCHC: 33.2 g/dL (ref 30.0–36.0)
MCV: 97.5 fL (ref 78.0–100.0)
Monocytes Absolute: 0.5 10*3/uL (ref 0.1–1.0)
Monocytes Relative: 11 % (ref 3–12)
Neutro Abs: 3.2 10*3/uL (ref 1.7–7.7)
Neutrophils Relative %: 65 % (ref 43–77)
Platelets: 24 10*3/uL — CL (ref 150–400)
RBC: 2.78 MIL/uL — ABNORMAL LOW (ref 3.87–5.11)
RDW: 16.6 % — ABNORMAL HIGH (ref 11.5–15.5)
WBC: 4.9 10*3/uL (ref 4.0–10.5)

## 2015-01-03 LAB — URINALYSIS, ROUTINE W REFLEX MICROSCOPIC
Bilirubin Urine: NEGATIVE
Glucose, UA: NEGATIVE mg/dL
Hgb urine dipstick: NEGATIVE
Ketones, ur: NEGATIVE mg/dL
Nitrite: NEGATIVE
Protein, ur: NEGATIVE mg/dL
Specific Gravity, Urine: 1.022 (ref 1.005–1.030)
Urobilinogen, UA: 0.2 mg/dL (ref 0.0–1.0)
pH: 7.5 (ref 5.0–8.0)

## 2015-01-03 LAB — COMPREHENSIVE METABOLIC PANEL
ALT: 11 U/L — ABNORMAL LOW (ref 14–54)
AST: 18 U/L (ref 15–41)
Albumin: 4.1 g/dL (ref 3.5–5.0)
Alkaline Phosphatase: 47 U/L (ref 38–126)
Anion gap: 5 (ref 5–15)
BUN: 13 mg/dL (ref 6–20)
CO2: 25 mmol/L (ref 22–32)
Calcium: 8.3 mg/dL — ABNORMAL LOW (ref 8.9–10.3)
Chloride: 109 mmol/L (ref 101–111)
Creatinine, Ser: 0.68 mg/dL (ref 0.44–1.00)
GFR calc Af Amer: >60 mL/min (ref >60)
GFR calc non Af Amer: >60 mL/min (ref >60)
Glucose, Bld: 115 mg/dL — ABNORMAL HIGH (ref 65–99)
Potassium: 4 mmol/L (ref 3.5–5.1)
Sodium: 139 mmol/L (ref 135–145)
Total Bilirubin: 0.6 mg/dL (ref 0.3–1.2)
Total Protein: 6.5 g/dL (ref 6.5–8.1)

## 2015-01-03 LAB — I-STAT CG4 LACTIC ACID, ED: Lactic Acid, Venous: 1.2 mmol/L (ref 0.5–2.0)

## 2015-01-03 LAB — RAPID STREP SCREEN (MED CTR MEBANE ONLY): Streptococcus, Group A Screen (Direct): NEGATIVE

## 2015-01-03 LAB — URINE MICROSCOPIC-ADD ON

## 2015-01-03 MED ORDER — HEPARIN SOD (PORK) LOCK FLUSH 100 UNIT/ML IV SOLN
500.0000 [IU] | Freq: Once | INTRAVENOUS | Status: AC
  Administered 2015-01-03: 500 [IU]
  Filled 2015-01-03: qty 5

## 2015-01-03 MED ORDER — SODIUM CHLORIDE 0.9 % IV BOLUS (SEPSIS)
1000.0000 mL | Freq: Once | INTRAVENOUS | Status: AC
  Administered 2015-01-03: 1000 mL via INTRAVENOUS

## 2015-01-03 MED ORDER — LEVOFLOXACIN 750 MG PO TABS: 750.0000 mg | ORAL_TABLET | Freq: Every day | ORAL | Status: DC

## 2015-01-03 NOTE — Discharge Instructions (Signed)
Follow up with your Oncologist.

## 2015-01-03 NOTE — ED Provider Notes (Signed)
CSN: [id number]     Arrival date & time 01/03/15  1911 History   First MD Initiated Contact with Patient 01/03/15 1928     Chief Complaint  Patient presents with  . URI     (Consider location/radiation/quality/duration/timing/severity/associated sxs/prior Treatment) HPI Comments: Patient with a history of Multiple Myeloma s/p stem cell transplant (January 2016) currently undergoing chemotherapy presents today with fever.   She states that she had a fever of 101 F at home earlier today.  She called the office of the oncologist and was instructed to come to the ED.  She states that her last chemo was on 6/27 and 6/28.  She states that she did not receive her weekly chemo last week because her platelets were 30.  She reports associated dry cough, nasal congestion, and sore throat.  She denies fever, chills, nausea, vomiting, abdominal pain, urinary symptoms, headache, neck pain/stiffness, SOB, or chest pain.  She states that she last took Ibuprofen for the fever at 5:30 PM.    Patient is a 69 y.o. female presenting with URI. The history is provided by the patient.  URI   Past Medical History  Diagnosis Date  . Cervical disc disease     c7  . Spinal stenosis in cervical region   . Carpal tunnel syndrome   . Pinched nerve   . Varicose veins of lower extremities with inflammation   . Disc degeneration   . Hypertension     Borderline.  . Carotid stenosis     Mild bilateral  . Anemia   . Anxiety   . GERD (gastroesophageal reflux disease)   . Cancer   . Failure of stem cell transplant    Past Surgical History  Procedure Laterality Date  . Colonoscopy    . Bladder tact  1988/1989    x2  . Shoulder surgery Right 04/06/13    right  . Port-a-cath removal     Family History  Problem Relation Age of Onset  . Heart disease Mother   . Heart failure Father   . Heart disease Father   . Hypertension Brother   . Heart disease Brother     congenital shunt, ?   . Colon cancer Paternal  Aunt 52  . Throat cancer Father   . Skin cancer Father   . Skin cancer Brother   . Diabetes Father   . Kidney disease Father    History  Substance Use Topics  . Smoking status: Former Smoker -- 0.50 packs/day for 4 years    Quit date: 06/26/1968  . Smokeless tobacco: Never Used  . Alcohol Use: 0.6 oz/week    1 Cans of beer per week     Comment: minimal   OB History    No data available     Review of Systems  All other systems reviewed and are negative.     Allergies  Penicillins and Sulfa antibiotics  Home Medications   Prior to Admission medications   Medication Sig Start Date End Date Taking? Authorizing Provider  acetaminophen (TYLENOL) 500 MG tablet Take 1,500 mg by mouth as needed for moderate pain, fever or headache.    Yes Historical Provider, MD  acyclovir (ZOVIRAX) 400 MG tablet Take 1 tablet (400 mg total) by mouth 2 (two) times daily. 04/27/14  Yes [name], MD  calcium carbonate (TUMS EX) 750 MG chewable tablet Chew 1 tablet by mouth 3 (three) times daily as needed for heartburn.  08/04/14  Yes Historical Provider, MD  carvedilol (COREG) 6.25 MG tablet Take 0.5 tablets (3.125 mg total) by mouth 2 (two) times daily with a meal. 12/23/14  Yes [name], MD  cholecalciferol (VITAMIN D) 1000 UNITS tablet Take 1,000 Units by mouth daily.   Yes Historical Provider, MD  docusate sodium (COLACE) 100 MG capsule Take 100 mg by mouth 2 (two) times daily as needed for mild constipation or moderate constipation.    Yes Historical Provider, MD  lenalidomide (REVLIMID) 15 MG capsule Take 1 capsule (15 mg total) by mouth daily. 12/04/14  Yes [name], MD  loperamide (IMODIUM A-D) 2 MG tablet Take 2 mg by mouth 4 (four) times daily as needed for diarrhea or loose stools.   Yes Historical Provider, MD  mirtazapine (REMERON) 30 MG tablet TAKE 1 TABLET BY MOUTH AT BEDTIME Patient taking differently: TAKE 1/2 TABLET BY MOUTH AT BEDTIME 08/28/14  Yes [name], MD  Multiple  Vitamin (MULTIVITAMIN WITH MINERALS) TABS tablet Take 1 tablet by mouth daily.   Yes Historical Provider, MD  ondansetron (ZOFRAN) 8 MG tablet Take 1 tablet (8 mg total) by mouth 2 (two) times daily. Start the day after chemo for 2 days. Then as needed for nausea or vomiting. 10/15/14  Yes [name], MD  OXYQUINOLONE SULFATE VAGINAL (TRIMO-SAN) 0.025 % GEL Place 1 application vaginally 2 (two) times a week. 11/19/14  Yes Historical Provider, MD   BP 138/69 mmHg  Pulse 100  Temp(Src) 100.9 F (38.3 C) (Rectal)  Resp 20  SpO2 97% Physical Exam  Constitutional: She appears well-developed and well-nourished.  HENT:  Head: Normocephalic and atraumatic.  Mouth/Throat: Oropharynx is clear and moist.  Neck: Normal range of motion. Neck supple.  Cardiovascular: Normal rate, regular rhythm and normal heart sounds.   Pulmonary/Chest: Effort normal and breath sounds normal.  Abdominal: Soft. Bowel sounds are normal. She exhibits no distension and no mass. There is no tenderness. There is no rebound and no guarding.  Musculoskeletal: Normal range of motion.  Neurological: She is alert.  Skin: Skin is warm and dry.  Psychiatric: She has a normal mood and affect.  Nursing note and vitals reviewed.   ED Course  Procedures (including critical care time) Labs Review Labs Reviewed  CULTURE, BLOOD (ROUTINE X 2)  CULTURE, BLOOD (ROUTINE X 2)  URINE CULTURE  CBC WITH DIFFERENTIAL/PLATELET  COMPREHENSIVE METABOLIC PANEL  URINALYSIS, ROUTINE W REFLEX MICROSCOPIC (NOT AT [facility])  I-STAT CG4 LACTIC ACID, ED    Imaging Review Dg Chest 2 View  01/03/2015   CLINICAL DATA:  Cough and congestion beginning yesterday.  EXAM: CHEST  2 VIEW  COMPARISON:  10/02/2013  FINDINGS: Power port is in place on the right with its tip at the SVC RA junction. Heart size is normal. Mediastinal shadows are normal. The lungs are clear except for calcified granuloma at the right base. No acute bone finding. Previous shoulder  arthroplasty on the right.  IMPRESSION: No active disease.   Electronically Signed   By: Nelson Chimes M.D.   On: 01/03/2015 21:01     EKG Interpretation None      MDM   Final diagnoses:  Fever   Patient with a history of Multiple Myeloma currently on chemo presents today with a fever.  She has a fever of 100.9 F rectally upon arrival in the ED.  Patient is not toxic appearing.  Blood pressure is stable.  No hypoxia.  Patient mildly tachycardic with a HR of 100.  CXR is negative.  UA  is negative for infection.  Abdomen is soft and nontender.  Rapid strep is negative.  Labs today unremarkable aside from thrombocytopenia, which patient has been recently.  She is not neutropenic.  Lactate is WNL.  Patient given IVF in the ED.  Patient started on Levaquin and instructed to follow up with Hem/Onc.  Patient is stable for discharge.  Patient also evaluated by Dr. Aline Brochure who is in agreement with the plan.  Strict return precautions given.    Hyman Bible, PA-C 01/05/15 0017  Pamella Pert, MD 01/08/15 [phone]

## 2015-01-03 NOTE — ED Notes (Signed)
Pt states that she started with cough and congestion yesterday; pt reports fever of 101.3 at home this afternoon; pt states that she took Tylenol at 1730; pt reports NP cough and congestion, sore throat; pt states that she is a recent transplant pt (1/ 26) and that her Platelets are low; pt states that she called her MD and he advised that due to fever to come for evaluation

## 2015-01-04 ENCOUNTER — Encounter: Payer: Self-pay | Admitting: Hematology and Oncology

## 2015-01-04 ENCOUNTER — Telehealth: Payer: Self-pay | Admitting: Hematology and Oncology

## 2015-01-04 ENCOUNTER — Other Ambulatory Visit: Payer: Self-pay | Admitting: Hematology and Oncology

## 2015-01-04 NOTE — Telephone Encounter (Signed)
s.w. pt and advised on 7.19 appt time change....pt ok and aware

## 2015-01-05 ENCOUNTER — Other Ambulatory Visit: Payer: Medicare HMO

## 2015-01-05 ENCOUNTER — Ambulatory Visit: Payer: Medicare HMO

## 2015-01-05 LAB — URINE CULTURE

## 2015-01-06 ENCOUNTER — Ambulatory Visit: Payer: Medicare HMO

## 2015-01-06 ENCOUNTER — Other Ambulatory Visit: Payer: Self-pay | Admitting: Hematology and Oncology

## 2015-01-06 DIAGNOSIS — D6959 Other secondary thrombocytopenia: Secondary | ICD-10-CM

## 2015-01-06 DIAGNOSIS — T451X5A Adverse effect of antineoplastic and immunosuppressive drugs, initial encounter: Secondary | ICD-10-CM

## 2015-01-06 DIAGNOSIS — D630 Anemia in neoplastic disease: Secondary | ICD-10-CM

## 2015-01-06 DIAGNOSIS — D701 Agranulocytosis secondary to cancer chemotherapy: Secondary | ICD-10-CM

## 2015-01-06 DIAGNOSIS — T50905A Adverse effect of unspecified drugs, medicaments and biological substances, initial encounter: Secondary | ICD-10-CM

## 2015-01-07 LAB — CULTURE, GROUP A STREP: Strep A Culture: NEGATIVE

## 2015-01-08 LAB — CULTURE, BLOOD (ROUTINE X 2)
Culture: NO GROWTH
Culture: NO GROWTH

## 2015-01-12 ENCOUNTER — Other Ambulatory Visit: Payer: Medicare HMO

## 2015-01-12 ENCOUNTER — Ambulatory Visit: Payer: Medicare HMO

## 2015-01-12 ENCOUNTER — Encounter: Payer: Self-pay | Admitting: Hematology and Oncology

## 2015-01-12 ENCOUNTER — Ambulatory Visit (HOSPITAL_BASED_OUTPATIENT_CLINIC_OR_DEPARTMENT_OTHER): Payer: Medicare HMO | Admitting: Hematology and Oncology

## 2015-01-12 ENCOUNTER — Telehealth: Payer: Self-pay

## 2015-01-12 ENCOUNTER — Other Ambulatory Visit (HOSPITAL_BASED_OUTPATIENT_CLINIC_OR_DEPARTMENT_OTHER): Payer: Medicare HMO

## 2015-01-12 VITALS — BP 151/70 | HR 97 | Temp 98.1°F | Resp 18 | Ht 62.0 in | Wt 157.3 lb

## 2015-01-12 DIAGNOSIS — D72819 Decreased white blood cell count, unspecified: Secondary | ICD-10-CM

## 2015-01-12 DIAGNOSIS — D701 Agranulocytosis secondary to cancer chemotherapy: Secondary | ICD-10-CM

## 2015-01-12 DIAGNOSIS — D630 Anemia in neoplastic disease: Secondary | ICD-10-CM | POA: Diagnosis not present

## 2015-01-12 DIAGNOSIS — D6959 Other secondary thrombocytopenia: Secondary | ICD-10-CM

## 2015-01-12 DIAGNOSIS — C9000 Multiple myeloma not having achieved remission: Secondary | ICD-10-CM | POA: Diagnosis not present

## 2015-01-12 DIAGNOSIS — T451X5A Adverse effect of antineoplastic and immunosuppressive drugs, initial encounter: Secondary | ICD-10-CM

## 2015-01-12 DIAGNOSIS — D801 Nonfamilial hypogammaglobulinemia: Secondary | ICD-10-CM | POA: Insufficient documentation

## 2015-01-12 DIAGNOSIS — T50905A Adverse effect of unspecified drugs, medicaments and biological substances, initial encounter: Secondary | ICD-10-CM

## 2015-01-12 DIAGNOSIS — D839 Common variable immunodeficiency, unspecified: Secondary | ICD-10-CM

## 2015-01-12 LAB — COMPREHENSIVE METABOLIC PANEL (CC13)
ALT: 7 U/L (ref 0–55)
AST: 12 U/L (ref 5–34)
Albumin: 3.9 g/dL (ref 3.5–5.0)
Alkaline Phosphatase: 47 U/L (ref 40–150)
Anion Gap: 6 mEq/L (ref 3–11)
BUN: 12.8 mg/dL (ref 7.0–26.0)
CO2: 28 mEq/L (ref 22–29)
Calcium: 9.2 mg/dL (ref 8.4–10.4)
Chloride: 106 mEq/L (ref 98–109)
Creatinine: 0.7 mg/dL (ref 0.6–1.1)
EGFR: 89 mL/min/{1.73_m2} — ABNORMAL LOW (ref >90)
Glucose: 91 mg/dl (ref 70–140)
Potassium: 4.5 mEq/L (ref 3.5–5.1)
Sodium: 140 mEq/L (ref 136–145)
Total Bilirubin: 0.59 mg/dL (ref 0.20–1.20)
Total Protein: 6.3 g/dL — ABNORMAL LOW (ref 6.4–8.3)

## 2015-01-12 LAB — VITAMIN B12: Vitamin B-12: 565 pg/mL (ref 211–911)

## 2015-01-12 LAB — CBC WITH DIFFERENTIAL/PLATELET
BASO%: 0.3 % (ref 0.0–2.0)
Basophils Absolute: 0 10*3/uL (ref 0.0–0.1)
EOS%: 1.9 % (ref 0.0–7.0)
Eosinophils Absolute: 0.1 10*3/uL (ref 0.0–0.5)
HCT: 23.3 % — ABNORMAL LOW (ref 34.8–46.6)
HGB: 7.9 g/dL — ABNORMAL LOW (ref 11.6–15.9)
LYMPH%: 43.6 % (ref 14.0–49.7)
MCH: 32.5 pg (ref 25.1–34.0)
MCHC: 33.9 g/dL (ref 31.5–36.0)
MCV: 95.9 fL (ref 79.5–101.0)
MONO#: 0.2 10*3/uL (ref 0.1–0.9)
MONO%: 5.3 % (ref 0.0–14.0)
NEUT#: 1.6 10*3/uL (ref 1.5–6.5)
NEUT%: 48.9 % (ref 38.4–76.8)
Platelets: 25 10*3/uL — ABNORMAL LOW (ref 145–400)
RBC: 2.43 10*6/uL — ABNORMAL LOW (ref 3.70–5.45)
RDW: 17.4 % — ABNORMAL HIGH (ref 11.2–14.5)
WBC: 3.2 10*3/uL — ABNORMAL LOW (ref 3.9–10.3)
lymph#: 1.4 10*3/uL (ref 0.9–3.3)
nRBC: 0 % (ref 0–0)

## 2015-01-12 NOTE — Telephone Encounter (Signed)
Jean Davidson called asking about revlimid. Explained that per Dr Alvy Bimler note revlimid will be restarted after platelets recuperate. Pt is at office at present but labs have not been resulted yet. [name] said pt would need a new prescription sent when pt is to restart revlimid.

## 2015-01-12 NOTE — Assessment & Plan Note (Signed)
Unfortunately, due to significant and persistent pancytopenia, our treatment is placed on hold. I explained to the patient the rationale for this. I recommend we repeat blood work again next week for assessment. There is a possibility that she might have lingering infection. We discussed the role of IVIG to help her fight off viral infection given the fact that she has acquired, persistent panhypogammaglobulinemia. She is interested to proceed.

## 2015-01-12 NOTE — Assessment & Plan Note (Signed)
This is likely due to recent treatment. The patient denies recent history of fevers, cough, chills, diarrhea or dysuria. She is asymptomatic from the leukopenia. I will observe for now. I will placed on the treatment on hold.

## 2015-01-12 NOTE — Assessment & Plan Note (Signed)
This is likely due to recent treatment. The patient denies recent history of bleeding such as epistaxis, hematuria or hematochezia. She is asymptomatic from the low platelet count. I will observe for now.  Plan to hold treatment now until platelet count is greater than 50,000

## 2015-01-12 NOTE — Assessment & Plan Note (Signed)
This is likely due to recent treatment. The patient denies recent history of bleeding such as epistaxis, hematuria or hematochezia. She is asymptomatic from the anemia. I will observe for now.   We'll recheck again her blood work next week. She may need blood transfusion if it drops further or if she becomes symptomatic.

## 2015-01-12 NOTE — Assessment & Plan Note (Signed)
This is related to her bone marrow corresponds status and recent treatment. She is not fighting off infection well. I discussed with her the role of IVIG for primary immunodeficiency. Risk, benefit, side effects of IVIG was discussed and she agreed to proceed. I will try to schedule that for next week.

## 2015-01-12 NOTE — Progress Notes (Signed)
[place] OFFICE PROGRESS NOTE  Patient Care Team: Jinny Sanders, MD as PCP - General (Family Medicine) [name], MD as Referring Physician (Hematology and Oncology) [name], MD as Consulting Physician (Internal Medicine)  SUMMARY OF ONCOLOGIC HISTORY: Oncology History   Multiple myeloma, Ig A Lambda, M spike 3.54 grams, Calcium 9.2, Creatinine 0.8, Beta 2 microglobulin 4.52, IgA 4840 mg/dL, lambda light chain 75.4, albumin 3.6, hemoglobin 9.7, platelet 115    Primary site: Multiple Myeloma   Staging method: AJCC 6th Edition   Clinical: Stage IIA signed by [name], MD on 11/07/2013  2:46 PM   Summary: Stage IIA        Multiple myeloma   10/31/2013 Bone Marrow Biopsy Bone marrow biopsy confirmed multiple myeloma with 40% bone marrow involvement. Skeletal survey showed minimal lesions in her score with generalized demineralization   11/10/2013 - 02/13/2014 Chemotherapy The patient is started on induction chemotherapy with weekly dexamethasone 40 mg by mouth as well as Velcade subcutaneous injection on days 1, 4, 8 and 11. On 11/21/2013, she was started on monthly Zometa.   12/23/2013 Adverse Reaction The dose of Velcade was reduced due to thrombocytopenia.   01/28/2014 - 04/07/2014 Chemotherapy Revlimid is added. Treatment was discontinued due to lack of response.   02/24/2014 - 04/07/2014 Chemotherapy Due to worsening peripheral neuropathy, Velcade injection is changed to once a week. Revlimid was given 21 days on, 7 days off.   04/07/2014 - 04/10/2014 Chemotherapy Revlimid was discontinued due to lack of response. Chemotherapy was changed back to Velcade injection twice a week, 2 weeks on 1 week off. Her treatment was switched to to minimum response   04/20/2014 - 06/02/2014 Chemotherapy chemotherapy is switched to Carfilzomib, Cytoxan and dexamethasone.   04/22/2014 Procedure she has placement of port for chemotherapy.   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 She returns for further follow-up. She continues to have fatigue and nasal congestion, much improved compared to last week. The patient denies any recent signs or symptoms of bleeding such as spontaneous epistaxis, hematuria or hematochezia. She denies any chest pain, shortness of breath on dizziness.  REVIEW OF SYSTEMS:   Constitutional: Denies fevers, chills or abnormal weight loss Eyes: Denies blurriness of vision Ears, nose, mouth, throat, and face: Denies mucositis or sore throat Respiratory: Denies cough, dyspnea or wheezes Cardiovascular: Denies palpitation, chest discomfort or lower extremity swelling Gastrointestinal:  Denies nausea, heartburn or change in bowel habits Skin: Denies abnormal skin rashes Lymphatics: Denies new lymphadenopathy or easy bruising Neurological:Denies numbness, tingling or new weaknesses Behavioral/Psych: Mood is stable, no new changes  All other systems were reviewed with the patient and are negative.  I have reviewed the past medical history, past surgical history, social history and family history with the patient and they are unchanged from previous note.  ALLERGIES:  is allergic to penicillins and sulfa antibiotics.  MEDICATIONS:  Current Outpatient Prescriptions  Medication Sig Dispense Refill  . acetaminophen (TYLENOL) 500 MG tablet Take 1,500 mg by mouth as  needed for moderate pain, fever or headache.     Marland Kitchen acyclovir (ZOVIRAX) 400 MG tablet  Take 1 tablet (400 mg total) by mouth 2 (two) times daily. 180 tablet 3  . calcium carbonate (TUMS EX) 750 MG chewable tablet Chew 1 tablet by mouth 3 (three) times daily as needed for heartburn.     . carvedilol (COREG) 6.25 MG tablet Take 0.5 tablets (3.125 mg total) by mouth 2 (two) times daily with a meal. 90 tablet 3  . cholecalciferol (VITAMIN D) 1000 UNITS tablet Take 1,000 Units by mouth daily.    Marland Kitchen docusate sodium (COLACE) 100 MG capsule Take 100 mg by mouth 2 (two) times daily as needed for mild constipation or moderate constipation.     . mirtazapine (REMERON) 30 MG tablet TAKE 1 TABLET BY MOUTH AT BEDTIME (Patient taking differently: TAKE 1/2 TABLET BY MOUTH AT BEDTIME) 60 tablet 1  . Multiple Vitamin (MULTIVITAMIN WITH MINERALS) TABS tablet Take 1 tablet by mouth daily.    Marland Kitchen lenalidomide (REVLIMID) 15 MG capsule Take 1 capsule (15 mg total) by mouth daily. (Patient not taking: Reported on 01/12/2015) 21 capsule 0  . loperamide (IMODIUM A-D) 2 MG tablet Take 2 mg by mouth 4 (four) times daily as needed for diarrhea or loose stools.    . ondansetron (ZOFRAN) 8 MG tablet Take 1 tablet (8 mg total) by mouth 2 (two) times daily. Start the day after chemo for 2 days. Then as needed for nausea or vomiting. (Patient not taking: Reported on 01/12/2015) 30 tablet 1  . OXYQUINOLONE SULFATE VAGINAL (TRIMO-SAN) 0.025 % GEL Place 1 application vaginally 2 (two) times a week.     No current facility-administered medications for this visit.    PHYSICAL EXAMINATION: ECOG PERFORMANCE STATUS: 1 - Symptomatic but completely ambulatory  Filed Vitals:   01/12/15 1116  BP: 151/70  Pulse: 97  Temp: 98.1 F (36.7 C)  Resp: 18   Filed Weights   01/12/15 1116  Weight: 157 lb 4.8 oz (71.351 kg)    GENERAL:alert, no distress and comfortable SKIN: skin color, texture, turgor are normal, no rashes or significant lesions EYES: normal, Conjunctiva are pink and non-injected, sclera clear OROPHARYNX:no  exudate, no erythema and lips, buccal mucosa, and tongue normal  Musculoskeletal:no cyanosis of digits and no clubbing  NEURO: alert & oriented x 3 with fluent speech, no focal motor/sensory deficits  LABORATORY DATA:  I have reviewed the data as listed    Component Value Date/Time   NA 140 01/12/2015 1105   NA 139 01/03/2015 2021   K 4.5 01/12/2015 1105   K 4.0 01/03/2015 2021   CL 109 01/03/2015 2021   CO2 28 01/12/2015 1105   CO2 25 01/03/2015 2021   GLUCOSE 91 01/12/2015 1105   GLUCOSE 115* 01/03/2015 2021   BUN 12.8 01/12/2015 1105   BUN 13 01/03/2015 2021   CREATININE 0.7 01/12/2015 1105   CREATININE 0.68 01/03/2015 2021   CALCIUM 9.2 01/12/2015 1105   CALCIUM 8.3* 01/03/2015 2021   PROT 6.3* 01/12/2015 1105   PROT 6.5 01/03/2015 2021   ALBUMIN 3.9 01/12/2015 1105   ALBUMIN 4.1 01/03/2015 2021   AST 12 01/12/2015 1105   AST 18 01/03/2015 2021   ALT 7 01/12/2015 1105   ALT 11* 01/03/2015 2021   ALKPHOS 47 01/12/2015 1105   ALKPHOS 47 01/03/2015 2021   BILITOT 0.59 01/12/2015 1105   BILITOT 0.6 01/03/2015 2021   GFRNONAA >60 01/03/2015 2021   GFRAA >60 01/03/2015  2021    No results found for: SPEP, UPEP  Lab Results  Component Value Date   WBC 3.2* 01/12/2015   NEUTROABS 1.6 01/12/2015   HGB 7.9* 01/12/2015   HCT 23.3* 01/12/2015   MCV 95.9 01/12/2015   PLT 25* 01/12/2015      Chemistry      Component Value Date/Time   NA 140 01/12/2015 1105   NA 139 01/03/2015 2021   K 4.5 01/12/2015 1105   K 4.0 01/03/2015 2021   CL 109 01/03/2015 2021   CO2 28 01/12/2015 1105   CO2 25 01/03/2015 2021   BUN 12.8 01/12/2015 1105   BUN 13 01/03/2015 2021   CREATININE 0.7 01/12/2015 1105   CREATININE 0.68 01/03/2015 2021      Component Value Date/Time   CALCIUM 9.2 01/12/2015 1105   CALCIUM 8.3* 01/03/2015 2021   ALKPHOS 47 01/12/2015 1105   ALKPHOS 47 01/03/2015 2021   AST 12 01/12/2015 1105   AST 18 01/03/2015 2021   ALT 7 01/12/2015 1105   ALT 11*  01/03/2015 2021   BILITOT 0.59 01/12/2015 1105   BILITOT 0.6 01/03/2015 2021     All of her most recent blood culture, urine culture and throat culture were negative  ASSESSMENT & PLAN:  Multiple myeloma Unfortunately, due to significant and persistent pancytopenia, our treatment is placed on hold. I explained to the patient the rationale for this. I recommend we repeat blood work again next week for assessment. There is a possibility that she might have lingering infection. We discussed the role of IVIG to help her fight off viral infection given the fact that she has acquired, persistent panhypogammaglobulinemia. She is interested to proceed.  Anemia in neoplastic disease This is likely due to recent treatment. The patient denies recent history of bleeding such as epistaxis, hematuria or hematochezia. She is asymptomatic from the anemia. I will observe for now.   We'll recheck again her blood work next week. She may need blood transfusion if it drops further or if she becomes symptomatic.  Leukopenia due to antineoplastic chemotherapy This is likely due to recent treatment. The patient denies recent history of fevers, cough, chills, diarrhea or dysuria. She is asymptomatic from the leukopenia. I will observe for now. I will placed on the treatment on hold.  Thrombocytopenia due to drugs This is likely due to recent treatment. The patient denies recent history of bleeding such as epistaxis, hematuria or hematochezia. She is asymptomatic from the low platelet count. I will observe for now.  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 She is not fighting off infection well. I discussed with her the role of IVIG for primary immunodeficiency. Risk, benefit, side effects of IVIG was discussed and she agreed to proceed. I will try to schedule that for next  week.   Orders Placed This Encounter  Procedures  . SPEP & IFE with QIG    Standing Status: Future     Number of Occurrences:      Standing Expiration Date: 02/16/2016  . Kappa/lambda light chains    Standing Status: Future     Number of Occurrences:      Standing Expiration Date: 02/16/2016  . Hold Tube, Blood Bank    Standing Status: Future     Number of Occurrences:      Standing Expiration Date: 02/16/2016   All questions were answered.  The patient knows to call the clinic with any problems, questions or concerns. No barriers to learning was detected. I spent 30 minutes counseling the patient face to face. The total time spent in the appointment was 40 minutes and more than 50% was on counseling and review of test results     [facility], Angie, MD 01/12/2015 1:08 PM

## 2015-01-13 ENCOUNTER — Ambulatory Visit: Payer: Medicare HMO

## 2015-01-19 ENCOUNTER — Telehealth: Payer: Self-pay | Admitting: *Deleted

## 2015-01-19 ENCOUNTER — Ambulatory Visit (HOSPITAL_BASED_OUTPATIENT_CLINIC_OR_DEPARTMENT_OTHER): Payer: Medicare HMO

## 2015-01-19 ENCOUNTER — Ambulatory Visit (HOSPITAL_COMMUNITY)
Admission: RE | Admit: 2015-01-19 | Discharge: 2015-01-19 | Disposition: A | Discharge status: Home / Self Care | Payer: Medicare HMO | Source: Ambulatory Visit | Attending: Hematology and Oncology | Admitting: Hematology and Oncology

## 2015-01-19 ENCOUNTER — Other Ambulatory Visit: Payer: Self-pay | Admitting: Hematology and Oncology

## 2015-01-19 ENCOUNTER — Ambulatory Visit (HOSPITAL_BASED_OUTPATIENT_CLINIC_OR_DEPARTMENT_OTHER): Payer: Medicare HMO | Admitting: Hematology and Oncology

## 2015-01-19 ENCOUNTER — Other Ambulatory Visit (HOSPITAL_BASED_OUTPATIENT_CLINIC_OR_DEPARTMENT_OTHER): Payer: Medicare HMO

## 2015-01-19 ENCOUNTER — Telehealth: Payer: Self-pay | Admitting: Hematology and Oncology

## 2015-01-19 VITALS — BP 150/71 | HR 74 | Temp 98.1°F | Resp 16

## 2015-01-19 VITALS — BP 132/46 | HR 94 | Temp 98.9°F | Resp 18 | Ht 62.0 in | Wt 158.7 lb

## 2015-01-19 DIAGNOSIS — C9000 Multiple myeloma not having achieved remission: Secondary | ICD-10-CM

## 2015-01-19 DIAGNOSIS — T451X5A Adverse effect of antineoplastic and immunosuppressive drugs, initial encounter: Secondary | ICD-10-CM

## 2015-01-19 DIAGNOSIS — D6959 Other secondary thrombocytopenia: Secondary | ICD-10-CM | POA: Diagnosis not present

## 2015-01-19 DIAGNOSIS — D72819 Decreased white blood cell count, unspecified: Secondary | ICD-10-CM | POA: Diagnosis not present

## 2015-01-19 DIAGNOSIS — D839 Common variable immunodeficiency, unspecified: Secondary | ICD-10-CM

## 2015-01-19 DIAGNOSIS — D649 Anemia, unspecified: Secondary | ICD-10-CM | POA: Diagnosis not present

## 2015-01-19 DIAGNOSIS — D701 Agranulocytosis secondary to cancer chemotherapy: Secondary | ICD-10-CM

## 2015-01-19 DIAGNOSIS — D630 Anemia in neoplastic disease: Secondary | ICD-10-CM | POA: Diagnosis not present

## 2015-01-19 DIAGNOSIS — D801 Nonfamilial hypogammaglobulinemia: Secondary | ICD-10-CM

## 2015-01-19 DIAGNOSIS — Z9481 Bone marrow transplant status: Secondary | ICD-10-CM

## 2015-01-19 DIAGNOSIS — T50905A Adverse effect of unspecified drugs, medicaments and biological substances, initial encounter: Secondary | ICD-10-CM

## 2015-01-19 LAB — CBC WITH DIFFERENTIAL/PLATELET
BASO%: 0 % (ref 0.0–2.0)
Basophils Absolute: 0 10*3/uL (ref 0.0–0.1)
EOS%: 1.2 % (ref 0.0–7.0)
Eosinophils Absolute: 0 10*3/uL (ref 0.0–0.5)
HCT: 20.4 % — ABNORMAL LOW (ref 34.8–46.6)
HGB: 6.9 g/dL — CL (ref 11.6–15.9)
LYMPH%: 36.8 % (ref 14.0–49.7)
MCH: 32.9 pg (ref 25.1–34.0)
MCHC: 33.8 g/dL (ref 31.5–36.0)
MCV: 97.1 fL (ref 79.5–101.0)
MONO#: 0.1 10*3/uL (ref 0.1–0.9)
MONO%: 4.7 % (ref 0.0–14.0)
NEUT#: 1.5 10*3/uL (ref 1.5–6.5)
NEUT%: 57.3 % (ref 38.4–76.8)
Platelets: 29 10*3/uL — ABNORMAL LOW (ref 145–400)
RBC: 2.1 10*6/uL — ABNORMAL LOW (ref 3.70–5.45)
RDW: 18.5 % — ABNORMAL HIGH (ref 11.2–14.5)
WBC: 2.6 10*3/uL — ABNORMAL LOW (ref 3.9–10.3)
lymph#: 1 10*3/uL (ref 0.9–3.3)

## 2015-01-19 LAB — COMPREHENSIVE METABOLIC PANEL (CC13)
ALT: 7 U/L (ref 0–55)
AST: 11 U/L (ref 5–34)
Albumin: 3.9 g/dL (ref 3.5–5.0)
Alkaline Phosphatase: 43 U/L (ref 40–150)
Anion Gap: 8 mEq/L (ref 3–11)
BUN: 15.1 mg/dL (ref 7.0–26.0)
CO2: 27 mEq/L (ref 22–29)
Calcium: 9.2 mg/dL (ref 8.4–10.4)
Chloride: 105 mEq/L (ref 98–109)
Creatinine: 0.8 mg/dL (ref 0.6–1.1)
EGFR: 80 mL/min/{1.73_m2} — ABNORMAL LOW (ref >90)
Glucose: 104 mg/dl (ref 70–140)
Potassium: 4.1 mEq/L (ref 3.5–5.1)
Sodium: 141 mEq/L (ref 136–145)
Total Bilirubin: 0.55 mg/dL (ref 0.20–1.20)
Total Protein: 6.2 g/dL — ABNORMAL LOW (ref 6.4–8.3)

## 2015-01-19 LAB — HOLD TUBE, BLOOD BANK

## 2015-01-19 LAB — ABO/RH: ABO/RH(D): O POS

## 2015-01-19 LAB — PREPARE RBC (CROSSMATCH)

## 2015-01-19 MED ORDER — SODIUM CHLORIDE 0.9 % IV SOLN
250.0000 mL | Freq: Once | INTRAVENOUS | Status: AC
  Administered 2015-01-19: 250 mL via INTRAVENOUS

## 2015-01-19 MED ORDER — ACETAMINOPHEN 325 MG PO TABS
ORAL_TABLET | ORAL | Status: AC
  Filled 2015-01-19: qty 2

## 2015-01-19 MED ORDER — DIPHENHYDRAMINE HCL 25 MG PO CAPS
ORAL_CAPSULE | ORAL | Status: AC
  Filled 2015-01-19: qty 1

## 2015-01-19 MED ORDER — ACETAMINOPHEN 325 MG PO TABS
650.0000 mg | ORAL_TABLET | Freq: Once | ORAL | Status: AC
  Administered 2015-01-19: 650 mg via ORAL

## 2015-01-19 MED ORDER — DIPHENHYDRAMINE HCL 25 MG PO CAPS
25.0000 mg | ORAL_CAPSULE | Freq: Once | ORAL | Status: AC
  Administered 2015-01-19: 25 mg via ORAL

## 2015-01-19 MED ORDER — HEPARIN SOD (PORK) LOCK FLUSH 100 UNIT/ML IV SOLN
500.0000 [IU] | Freq: Once | INTRAVENOUS | Status: AC
  Administered 2015-01-19: 500 [IU] via INTRAVENOUS
  Filled 2015-01-19: qty 5

## 2015-01-19 MED ORDER — SODIUM CHLORIDE 0.9 % IJ SOLN
10.0000 mL | INTRAMUSCULAR | Status: DC | PRN
  Administered 2015-01-19: 10 mL via INTRAVENOUS
  Filled 2015-01-19: qty 10

## 2015-01-19 NOTE — Patient Instructions (Signed)

## 2015-01-19 NOTE — Telephone Encounter (Signed)
Per staff message and POF I have scheduled appts. Advised scheduler of appts. JMW  

## 2015-01-19 NOTE — Telephone Encounter (Signed)
Pt confirmed labs/ov per 07/26 POF, gave pt AVS and Calendar... KJ, sent msg to add chemo °

## 2015-01-20 ENCOUNTER — Encounter: Payer: Self-pay | Admitting: Hematology and Oncology

## 2015-01-20 ENCOUNTER — Ambulatory Visit (HOSPITAL_BASED_OUTPATIENT_CLINIC_OR_DEPARTMENT_OTHER): Payer: Medicare HMO

## 2015-01-20 VITALS — BP 121/58 | HR 75 | Temp 98.8°F | Resp 18 | Ht 62.0 in

## 2015-01-20 DIAGNOSIS — Z9481 Bone marrow transplant status: Secondary | ICD-10-CM

## 2015-01-20 DIAGNOSIS — D801 Nonfamilial hypogammaglobulinemia: Secondary | ICD-10-CM

## 2015-01-20 DIAGNOSIS — C9000 Multiple myeloma not having achieved remission: Secondary | ICD-10-CM

## 2015-01-20 LAB — TYPE AND SCREEN
ABO/RH(D): O POS
Antibody Screen: NEGATIVE
Unit division: 0
Unit division: 0

## 2015-01-20 MED ORDER — ACETAMINOPHEN 325 MG PO TABS
ORAL_TABLET | ORAL | Status: AC
  Filled 2015-01-20: qty 2

## 2015-01-20 MED ORDER — DIPHENHYDRAMINE HCL 25 MG PO CAPS
ORAL_CAPSULE | ORAL | Status: AC
  Filled 2015-01-20: qty 1

## 2015-01-20 MED ORDER — IMMUNE GLOBULIN (HUMAN) 20 GM/200ML IV SOLN
0.5000 g/kg | INTRAVENOUS | Status: DC
  Administered 2015-01-20: 35 g via INTRAVENOUS
  Filled 2015-01-20: qty 350

## 2015-01-20 MED ORDER — SODIUM CHLORIDE 0.9 % IJ SOLN
10.0000 mL | INTRAMUSCULAR | Status: DC | PRN
  Administered 2015-01-20: 10 mL
  Filled 2015-01-20: qty 10

## 2015-01-20 MED ORDER — HEPARIN SOD (PORK) LOCK FLUSH 100 UNIT/ML IV SOLN
500.0000 [IU] | Freq: Once | INTRAVENOUS | Status: AC | PRN
  Administered 2015-01-20: 500 [IU]
  Filled 2015-01-20: qty 5

## 2015-01-20 MED ORDER — SODIUM CHLORIDE 0.9 % IV SOLN
Freq: Once | INTRAVENOUS | Status: AC
  Administered 2015-01-20: 08:00:00 via INTRAVENOUS

## 2015-01-20 MED ORDER — DIPHENHYDRAMINE HCL 25 MG PO TABS
25.0000 mg | ORAL_TABLET | Freq: Once | ORAL | Status: AC
  Administered 2015-01-20: 25 mg via ORAL
  Filled 2015-01-20: qty 1

## 2015-01-20 MED ORDER — ACETAMINOPHEN 325 MG PO TABS
650.0000 mg | ORAL_TABLET | Freq: Once | ORAL | Status: AC
  Administered 2015-01-20: 650 mg via ORAL

## 2015-01-20 NOTE — Assessment & Plan Note (Signed)
She has progressive pancytopenia I recommend we continue to hold treatment I will proceed to order viral panel to exclude opportunistic infections.

## 2015-01-20 NOTE — Assessment & Plan Note (Signed)
We discussed some of the risks, benefits, and alternatives of blood transfusions. The patient is symptomatic from anemia and the hemoglobin level is critically low.  Some of the side-effects to be expected including risks of transfusion reactions, chills, infection, syndrome of volume overload and risk of hospitalization from various reasons and the patient is willing to proceed and went ahead to sign consent today.  

## 2015-01-20 NOTE — Assessment & Plan Note (Signed)
This is likely due to recent treatment. The patient denies recent history of bleeding such as epistaxis, hematuria or hematochezia. She is asymptomatic from the low platelet count. I will observe for now.  Plan to hold treatment now until platelet count is greater than 50,000

## 2015-01-20 NOTE — Patient Instructions (Signed)

## 2015-01-20 NOTE — Assessment & Plan Note (Signed)
She is susceptible to opportunistic infections. Will proceed with a viral panel

## 2015-01-20 NOTE — Assessment & Plan Note (Signed)
This is related to her bone marrow corresponds status and recent treatment. She is not fighting off infection well. I discussed with her the role of IVIG for primary immunodeficiency. Risk, benefit, side effects of IVIG was discussed and she agreed to proceed.

## 2015-01-20 NOTE — Assessment & Plan Note (Signed)
I suspect infections She has acquired hypogammaglobulinemia I discussed with her risks, benefits & side-effects of IVIG and she agreed to proceed I spoke with her insurance provider and obtained insurance approval

## 2015-01-20 NOTE — Progress Notes (Signed)
[place] OFFICE PROGRESS NOTE  Patient Care Team: Jinny Sanders, MD as PCP - General (Family Medicine) [name], MD as Referring Physician (Hematology and Oncology) [name], MD as Consulting Physician (Internal Medicine)  SUMMARY OF ONCOLOGIC HISTORY: Oncology History   Multiple myeloma, Ig A Lambda, M spike 3.54 grams, Calcium 9.2, Creatinine 0.8, Beta 2 microglobulin 4.52, IgA 4840 mg/dL, lambda light chain 75.4, albumin 3.6, hemoglobin 9.7, platelet 115    Primary site: Multiple Myeloma   Staging method: AJCC 6th Edition   Clinical: Stage IIA signed by [name], MD on 11/07/2013  2:46 PM   Summary: Stage IIA        Multiple myeloma   10/31/2013 Bone Marrow Biopsy Bone marrow biopsy confirmed multiple myeloma with 40% bone marrow involvement. Skeletal survey showed minimal lesions in her score with generalized demineralization   11/10/2013 - 02/13/2014 Chemotherapy The patient is started on induction chemotherapy with weekly dexamethasone 40 mg by mouth as well as Velcade subcutaneous injection on days 1, 4, 8 and 11. On 11/21/2013, she was started on monthly Zometa.   12/23/2013 Adverse Reaction The dose of Velcade was reduced due to thrombocytopenia.   01/28/2014 - 04/07/2014 Chemotherapy Revlimid is added. Treatment was discontinued due to lack of response.   02/24/2014 - 04/07/2014 Chemotherapy Due to worsening peripheral neuropathy, Velcade injection is changed to once a week. Revlimid was given 21 days on, 7 days off.   04/07/2014 - 04/10/2014 Chemotherapy Revlimid was discontinued due to lack of response. Chemotherapy was changed back to Velcade injection twice a week, 2 weeks on 1 week off. Her treatment was switched to to minimum response   04/20/2014 - 06/02/2014 Chemotherapy chemotherapy is switched to Carfilzomib, Cytoxan and dexamethasone.   04/22/2014 Procedure she has placement of port for chemotherapy.   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 She complained of excessive palpitations, ringing in her ears and fatigue The patient denies any recent signs or symptoms of bleeding such as spontaneous epistaxis, hematuria or hematochezia.   REVIEW OF SYSTEMS:   Constitutional: Denies fevers, chills or abnormal weight loss Eyes: Denies blurriness of vision Ears, nose, mouth, throat, and face: Denies mucositis or sore throat Respiratory: Denies cough, dyspnea or wheezes Gastrointestinal:  Denies nausea, heartburn or change in bowel habits Skin: Denies abnormal skin rashes Lymphatics: Denies new lymphadenopathy or easy bruising Neurological:Denies numbness, tingling or new weaknesses Behavioral/Psych: Mood is stable, no new changes  All other systems were reviewed with the patient and are negative.  I have reviewed the past medical history, past surgical history, social history and family history with the patient and they are unchanged from previous note.  ALLERGIES:  is allergic to penicillins and sulfa antibiotics.  MEDICATIONS:  Current Outpatient Prescriptions  Medication Sig Dispense Refill  . acetaminophen (TYLENOL) 500 MG tablet Take 1,500 mg by mouth as needed for moderate pain, fever or headache.     Marland Kitchen acyclovir (ZOVIRAX) 400 MG tablet Take 1 tablet (400 mg total) by mouth 2 (  two) times daily. 180 tablet 3  . calcium carbonate (TUMS EX) 750 MG chewable tablet Chew 1 tablet by mouth 3 (three) times daily as needed for  heartburn.     . carvedilol (COREG) 6.25 MG tablet Take 0.5 tablets (3.125 mg total) by mouth 2 (two) times daily with a meal. 90 tablet 3  . cholecalciferol (VITAMIN D) 1000 UNITS tablet Take 1,000 Units by mouth daily.    Marland Kitchen docusate sodium (COLACE) 100 MG capsule Take 100 mg by mouth 2 (two) times daily as needed for mild constipation or moderate constipation.     Marland Kitchen lenalidomide (REVLIMID) 15 MG capsule Take 1 capsule (15 mg total) by mouth daily. (Patient not taking: Reported on 01/12/2015) 21 capsule 0  . loperamide (IMODIUM A-D) 2 MG tablet Take 2 mg by mouth 4 (four) times daily as needed for diarrhea or loose stools.    . mirtazapine (REMERON) 30 MG tablet TAKE 1 TABLET BY MOUTH AT BEDTIME (Patient taking differently: TAKE 1/2 TABLET BY MOUTH AT BEDTIME) 60 tablet 1  . Multiple Vitamin (MULTIVITAMIN WITH MINERALS) TABS tablet Take 1 tablet by mouth daily.    . ondansetron (ZOFRAN) 8 MG tablet Take 1 tablet (8 mg total) by mouth 2 (two) times daily. Start the day after chemo for 2 days. Then as needed for nausea or vomiting. (Patient not taking: Reported on 01/12/2015) 30 tablet 1  . OXYQUINOLONE SULFATE VAGINAL (TRIMO-SAN) 0.025 % GEL Place 1 application vaginally 2 (two) times a week.     No current facility-administered medications for this visit.    PHYSICAL EXAMINATION: ECOG PERFORMANCE STATUS: 2 - Symptomatic, <50% confined to bed  Filed Vitals:   01/19/15 0955  BP: 132/46  Pulse: 94  Temp: 98.9 F (37.2 C)  Resp: 18   Filed Weights   01/19/15 0955  Weight: 158 lb 11.2 oz (71.986 kg)    GENERAL:alert, no distress and comfortable SKIN: skin color is pale, texture, turgor are normal, no rashes or significant lesions EYES: normal, Conjunctiva are pale and non-injected, sclera clear OROPHARYNX:no exudate, no erythema and lips, buccal mucosa, and tongue normal  NECK: supple, thyroid normal size, non-tender, without nodularity LYMPH:  no palpable lymphadenopathy in the  cervical, axillary or inguinal LUNGS: clear to auscultation and percussion with normal breathing effort HEART: regular rate & rhythm and no murmurs and no lower extremity edema ABDOMEN:abdomen soft, non-tender and normal bowel sounds Musculoskeletal:no cyanosis of digits and no clubbing  NEURO: alert & oriented x 3 with fluent speech, no focal motor/sensory deficits  LABORATORY DATA:  I have reviewed the data as listed    Component Value Date/Time   NA 141 01/19/2015 0929   NA 139 01/03/2015 2021   K 4.1 01/19/2015 0929   K 4.0 01/03/2015 2021   CL 109 01/03/2015 2021   CO2 27 01/19/2015 0929   CO2 25 01/03/2015 2021   GLUCOSE 104 01/19/2015 0929   GLUCOSE 115* 01/03/2015 2021   BUN 15.1 01/19/2015 0929   BUN 13 01/03/2015 2021   CREATININE 0.8 01/19/2015 0929   CREATININE 0.68 01/03/2015 2021   CALCIUM 9.2 01/19/2015 0929   CALCIUM 8.3* 01/03/2015 2021   PROT 6.2* 01/19/2015 0929   PROT 6.5 01/03/2015 2021   ALBUMIN 3.9 01/19/2015 0929   ALBUMIN 4.1 01/03/2015 2021   AST 11 01/19/2015 0929   AST 18 01/03/2015 2021   ALT 7 01/19/2015 0929   ALT 11* 01/03/2015 2021   ALKPHOS 43 01/19/2015 0929   ALKPHOS  47 01/03/2015 2021   BILITOT 0.55 01/19/2015 0929   BILITOT 0.6 01/03/2015 2021   GFRNONAA >60 01/03/2015 2021   GFRAA >60 01/03/2015 2021    No results found for: SPEP, UPEP  Lab Results  Component Value Date   WBC 2.6* 01/19/2015   NEUTROABS 1.5 01/19/2015   HGB 6.9* 01/19/2015   HCT 20.4* 01/19/2015   MCV 97.1 01/19/2015   PLT 29* 01/19/2015      Chemistry      Component Value Date/Time   NA 141 01/19/2015 0929   NA 139 01/03/2015 2021   K 4.1 01/19/2015 0929   K 4.0 01/03/2015 2021   CL 109 01/03/2015 2021   CO2 27 01/19/2015 0929   CO2 25 01/03/2015 2021   BUN 15.1 01/19/2015 0929   BUN 13 01/03/2015 2021   CREATININE 0.8 01/19/2015 0929   CREATININE 0.68 01/03/2015 2021      Component Value Date/Time   CALCIUM 9.2 01/19/2015 0929   CALCIUM  8.3* 01/03/2015 2021   ALKPHOS 43 01/19/2015 0929   ALKPHOS 47 01/03/2015 2021   AST 11 01/19/2015 0929   AST 18 01/03/2015 2021   ALT 7 01/19/2015 0929   ALT 11* 01/03/2015 2021   BILITOT 0.55 01/19/2015 0929   BILITOT 0.6 01/03/2015 2021      ASSESSMENT & PLAN:  Multiple myeloma She has progressive pancytopenia I recommend we continue to hold treatment I will proceed to order viral panel to exclude opportunistic infections.  Anemia in neoplastic disease We discussed some of the risks, benefits, and alternatives of blood transfusions. The patient is symptomatic from anemia and the hemoglobin level is critically low.  Some of the side-effects to be expected including risks of transfusion reactions, chills, infection, syndrome of volume overload and risk of hospitalization from various reasons and the patient is willing to proceed and went ahead to sign consent today.   Bone marrow transplant status She is susceptible to opportunistic infections. Will proceed with a viral panel  Leukopenia due to antineoplastic chemotherapy I suspect infections She has acquired hypogammaglobulinemia I discussed with her risks, benefits & side-effects of IVIG and she agreed to proceed I spoke with her insurance provider and obtained insurance approval  Thrombocytopenia due to drugs This is likely due to recent treatment. The patient denies recent history of bleeding such as epistaxis, hematuria or hematochezia. She is asymptomatic from the low platelet count. I will observe for now.  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 She is not fighting off infection well. I discussed with her the role of IVIG for primary immunodeficiency. Risk, benefit, side effects of IVIG was discussed and she agreed to proceed.      Orders Placed This Encounter   Procedures  . CMV DNA, quantitative, PCR    Standing Status: Future     Number of Occurrences: 1     Standing Expiration Date: 01/19/2016  . Epstein barr vrs(ebv dna by pcr)    Standing Status: Future     Number of Occurrences:      Standing Expiration Date: 01/19/2016  . Other/Misc lab test    HHV 6 and HHV 8 PCR    Standing Status: Future     Number of Occurrences:      Standing Expiration Date: 01/19/2016  . Human parvovirus DNA detection by PCR    Standing Status: Future  Number of Occurrences:      Standing Expiration Date: 01/19/2016   All questions were answered. The patient knows to call the clinic with any problems, questions or concerns. No barriers to learning was detected. I spent 30 minutes counseling the patient face to face. The total time spent in the appointment was 40 minutes and more than 50% was on counseling and review of test results     [facility], Micco Bourbeau, MD 01/20/2015 9:17 PM

## 2015-01-20 NOTE — Progress Notes (Signed)
Pt tolerated first IVIG without difficulty. Discharged ambulatory in no acute distress after 30 min post IVIG completion.

## 2015-01-20 NOTE — Addendum Note (Signed)
Addended by: Cheyenne Adas D on: 01/20/2015 11:11 AM   Modules accepted: Orders

## 2015-01-21 ENCOUNTER — Telehealth: Payer: Self-pay | Admitting: *Deleted

## 2015-01-21 ENCOUNTER — Other Ambulatory Visit: Payer: Self-pay | Admitting: *Deleted

## 2015-01-21 MED ORDER — CARVEDILOL 6.25 MG PO TABS: 3.1250 mg | ORAL_TABLET | Freq: Two times a day (BID) | ORAL | Status: DC

## 2015-01-21 NOTE — Telephone Encounter (Signed)
°  1. Which medications need to be refilled? Carvedilol   2. Which pharmacy is medication to be sent to?  [facility] [place] on [address]  3. Do they need a 30 day or 90 day supply? 90   4. Would they like a call back once the medication has been sent to the pharmacy? No

## 2015-01-22 ENCOUNTER — Encounter: Payer: Self-pay | Admitting: Hematology and Oncology

## 2015-01-22 LAB — SPEP & IFE WITH QIG
Abnormal Protein Band1: 0.4 g/dL
Albumin ELP: 3.8 g/dL (ref 3.8–4.8)
Alpha-1-Globulin: 0.3 g/dL (ref 0.2–0.3)
Alpha-2-Globulin: 0.5 g/dL (ref 0.5–0.9)
Beta 2: 0.7 g/dL — ABNORMAL HIGH (ref 0.2–0.5)
Beta Globulin: 0.3 g/dL — ABNORMAL LOW (ref 0.4–0.6)
Gamma Globulin: 0.5 g/dL — ABNORMAL LOW (ref 0.8–1.7)
IgA: 470 mg/dL — ABNORMAL HIGH (ref 69–380)
IgG (Immunoglobin G), Serum: 558 mg/dL — ABNORMAL LOW (ref 690–1700)
IgM, Serum: 23 mg/dL — ABNORMAL LOW (ref 52–322)
Total Protein, Serum Electrophoresis: 6.1 g/dL (ref 6.1–8.1)

## 2015-01-22 LAB — KAPPA/LAMBDA LIGHT CHAINS
Kappa free light chain: 0.9 mg/dL (ref 0.33–1.94)
Kappa:Lambda Ratio: 0.08 — ABNORMAL LOW (ref 0.26–1.65)
Lambda Free Lght Chn: 11.5 mg/dL — ABNORMAL HIGH (ref 0.57–2.63)

## 2015-01-25 LAB — EPSTEIN BARR VIRUS DNA, QUANT RTPCR: EBV DNA, QN PCR: <200 copies/mL

## 2015-01-25 LAB — OTHER SOLSTAS TEST

## 2015-01-25 LAB — PARVOVIRUS B19 IGM: Parvovirus B19 IgM: 0.1 index (ref <0.9)

## 2015-01-25 LAB — CMV (CYTOMEGALOVIRUS) DNA ULTRAQUANT, PCR: CMV DNA Quant: <200 copies/mL (ref <200)

## 2015-01-25 LAB — HERPES VIRUS 6 AB, IGG: Herpesvirus-6 IgG Ab: 1:320 {titer} — AB

## 2015-01-26 ENCOUNTER — Ambulatory Visit: Payer: Medicare HMO

## 2015-01-26 ENCOUNTER — Ambulatory Visit (HOSPITAL_BASED_OUTPATIENT_CLINIC_OR_DEPARTMENT_OTHER): Payer: Medicare HMO

## 2015-01-26 ENCOUNTER — Encounter: Payer: Self-pay | Admitting: Hematology and Oncology

## 2015-01-26 ENCOUNTER — Telehealth: Payer: Self-pay | Admitting: Hematology and Oncology

## 2015-01-26 ENCOUNTER — Ambulatory Visit (HOSPITAL_BASED_OUTPATIENT_CLINIC_OR_DEPARTMENT_OTHER): Payer: Medicare HMO | Admitting: Hematology and Oncology

## 2015-01-26 ENCOUNTER — Telehealth: Payer: Self-pay

## 2015-01-26 ENCOUNTER — Telehealth: Payer: Self-pay | Admitting: *Deleted

## 2015-01-26 VITALS — BP 145/76 | HR 100 | Temp 98.8°F | Resp 18 | Ht 62.0 in | Wt 158.5 lb

## 2015-01-26 DIAGNOSIS — C9000 Multiple myeloma not having achieved remission: Secondary | ICD-10-CM

## 2015-01-26 DIAGNOSIS — T50905A Adverse effect of unspecified drugs, medicaments and biological substances, initial encounter: Secondary | ICD-10-CM

## 2015-01-26 DIAGNOSIS — D630 Anemia in neoplastic disease: Secondary | ICD-10-CM

## 2015-01-26 DIAGNOSIS — Z9481 Bone marrow transplant status: Secondary | ICD-10-CM

## 2015-01-26 DIAGNOSIS — D6959 Other secondary thrombocytopenia: Secondary | ICD-10-CM

## 2015-01-26 DIAGNOSIS — R002 Palpitations: Secondary | ICD-10-CM

## 2015-01-26 DIAGNOSIS — D72819 Decreased white blood cell count, unspecified: Secondary | ICD-10-CM

## 2015-01-26 DIAGNOSIS — R5383 Other fatigue: Secondary | ICD-10-CM | POA: Diagnosis not present

## 2015-01-26 DIAGNOSIS — T451X5A Adverse effect of antineoplastic and immunosuppressive drugs, initial encounter: Secondary | ICD-10-CM

## 2015-01-26 DIAGNOSIS — D701 Agranulocytosis secondary to cancer chemotherapy: Secondary | ICD-10-CM

## 2015-01-26 DIAGNOSIS — I10 Essential (primary) hypertension: Secondary | ICD-10-CM

## 2015-01-26 HISTORY — DX: Other fatigue: R53.83

## 2015-01-26 LAB — COMPREHENSIVE METABOLIC PANEL (CC13)
ALT: 10 U/L (ref 0–55)
AST: 15 U/L (ref 5–34)
Albumin: 4 g/dL (ref 3.5–5.0)
Alkaline Phosphatase: 45 U/L (ref 40–150)
Anion Gap: 7 mEq/L (ref 3–11)
BUN: 14.9 mg/dL (ref 7.0–26.0)
CO2: 28 mEq/L (ref 22–29)
Calcium: 9.4 mg/dL (ref 8.4–10.4)
Chloride: 107 mEq/L (ref 98–109)
Creatinine: 0.8 mg/dL (ref 0.6–1.1)
EGFR: 78 mL/min/{1.73_m2} — ABNORMAL LOW (ref >90)
Glucose: 105 mg/dl (ref 70–140)
Potassium: 4 mEq/L (ref 3.5–5.1)
Sodium: 142 mEq/L (ref 136–145)
Total Bilirubin: 0.54 mg/dL (ref 0.20–1.20)
Total Protein: 7 g/dL (ref 6.4–8.3)

## 2015-01-26 LAB — CBC WITH DIFFERENTIAL/PLATELET
BASO%: 0.3 % (ref 0.0–2.0)
Basophils Absolute: 0 10*3/uL (ref 0.0–0.1)
EOS%: 1.2 % (ref 0.0–7.0)
Eosinophils Absolute: 0 10*3/uL (ref 0.0–0.5)
HCT: 30.6 % — ABNORMAL LOW (ref 34.8–46.6)
HGB: 10.6 g/dL — ABNORMAL LOW (ref 11.6–15.9)
LYMPH%: 38.5 % (ref 14.0–49.7)
MCH: 32.1 pg (ref 25.1–34.0)
MCHC: 34.5 g/dL (ref 31.5–36.0)
MCV: 93.1 fL (ref 79.5–101.0)
MONO#: 0.1 10*3/uL (ref 0.1–0.9)
MONO%: 5 % (ref 0.0–14.0)
NEUT#: 1.6 10*3/uL (ref 1.5–6.5)
NEUT%: 55 % (ref 38.4–76.8)
Platelets: 45 10*3/uL — ABNORMAL LOW (ref 145–400)
RBC: 3.29 10*6/uL — ABNORMAL LOW (ref 3.70–5.45)
RDW: 17.2 % — ABNORMAL HIGH (ref 11.2–14.5)
WBC: 2.9 10*3/uL — ABNORMAL LOW (ref 3.9–10.3)
lymph#: 1.1 10*3/uL (ref 0.9–3.3)

## 2015-01-26 NOTE — Assessment & Plan Note (Signed)
This is likely due to recent treatment. The patient denies recent history of bleeding such as epistaxis, hematuria or hematochezia. She is asymptomatic from the low platelet count. I will observe for now.  Plan to hold treatment now until platelet count is greater than 50,000

## 2015-01-26 NOTE — Assessment & Plan Note (Signed)
Her blood pressure medication has been placed on hold due to recent hypotension. Her blood pressure is a little elevated today. I recommend close monitoring for now and resumption of blood pressure medication if systolic blood pressures consistently over 140.

## 2015-01-26 NOTE — Telephone Encounter (Signed)
per pof tos ch pt appt-gave pt copy of avs-sent MW email to sch trmt-pt stated has MY CHART and will review updated completed copy-pt stated will look @ [facility] for complted trmt sch-gave avs

## 2015-01-26 NOTE — Assessment & Plan Note (Signed)
This is likely anemia of chronic disease. The patient denies recent history of bleeding such as epistaxis, hematuria or hematochezia. She is asymptomatic from the anemia. We will observe for now.  She does not require transfusion now.   

## 2015-01-26 NOTE — Assessment & Plan Note (Addendum)
Her treatment remained on hold due to persistent pancytopenia. I will resume her treatment once her platelet count is above 50,000. The patient is disappointed to see her myeloma blood test is worse. If her platelet counts are better, I will resume Kyprolis only at reduced dose without Revlimid.

## 2015-01-26 NOTE — Progress Notes (Signed)
[place] OFFICE PROGRESS NOTE  Patient Care Team: Jinny Sanders, MD as PCP - General (Family Medicine) [name], MD as Referring Physician (Hematology and Oncology) [name], MD as Consulting Physician (Internal Medicine)  SUMMARY OF ONCOLOGIC HISTORY: Oncology History   Multiple myeloma, Ig A Lambda, M spike 3.54 grams, Calcium 9.2, Creatinine 0.8, Beta 2 microglobulin 4.52, IgA 4840 mg/dL, lambda light chain 75.4, albumin 3.6, hemoglobin 9.7, platelet 115    Primary site: Multiple Myeloma   Staging method: AJCC 6th Edition   Clinical: Stage IIA signed by [name], MD on 11/07/2013  2:46 PM   Summary: Stage IIA        Multiple myeloma   10/31/2013 Bone Marrow Biopsy Bone marrow biopsy confirmed multiple myeloma with 40% bone marrow involvement. Skeletal survey showed minimal lesions in her score with generalized demineralization   11/10/2013 - 02/13/2014 Chemotherapy The patient is started on induction chemotherapy with weekly dexamethasone 40 mg by mouth as well as Velcade subcutaneous injection on days 1, 4, 8 and 11. On 11/21/2013, she was started on monthly Zometa.   12/23/2013 Adverse Reaction The dose of Velcade was reduced due to thrombocytopenia.   01/28/2014 - 04/07/2014 Chemotherapy Revlimid is added. Treatment was discontinued due to lack of response.   02/24/2014 - 04/07/2014 Chemotherapy Due to worsening peripheral neuropathy, Velcade injection is changed to once a week. Revlimid was given 21 days on, 7 days off.   04/07/2014 - 04/10/2014 Chemotherapy Revlimid was discontinued due to lack of response. Chemotherapy was changed back to Velcade injection twice a week, 2 weeks on 1 week off. Her treatment was switched to to minimum response   04/20/2014 - 06/02/2014 Chemotherapy chemotherapy is switched to Carfilzomib, Cytoxan and dexamethasone.   04/22/2014 Procedure she has placement of port for chemotherapy.   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  she returns for further follow-up. She felt a bit better. The sensation of palpitation has resolved. She can complained of fatigue. The patient denies any recent signs or symptoms of bleeding such as spontaneous epistaxis, hematuria or hematochezia.  She denies recent infection.  REVIEW OF SYSTEMS:   Constitutional: Denies fevers, chills or abnormal weight loss Eyes: Denies blurriness of vision Ears, nose, mouth, throat, and face: Denies mucositis or sore throat Respiratory: Denies cough, dyspnea or wheezes Cardiovascular: Denies palpitation, chest discomfort or lower extremity swelling Gastrointestinal:  Denies nausea, heartburn or change in bowel habits Skin: Denies abnormal skin rashes Lymphatics: Denies new lymphadenopathy or easy bruising Neurological:Denies numbness, tingling or new weaknesses Behavioral/Psych: Mood is stable, no new changes  All other systems were reviewed with the patient and are negative.  I have reviewed the past medical history, past surgical history, social history and family history with the patient and they are unchanged from previous note.  ALLERGIES:  is allergic to penicillins and sulfa antibiotics.  MEDICATIONS:  Current Outpatient Prescriptions  Medication Sig Dispense Refill  . acetaminophen (TYLENOL) 500 MG tablet Take 1,500 mg by mouth as needed for  moderate pain, fever or headache.     Marland Kitchen acyclovir (ZOVIRAX) 400 MG tablet Take 1 tablet (400 mg  total) by mouth 2 (two) times daily. 180 tablet 3  . calcium carbonate (TUMS EX) 750 MG chewable tablet Chew 1 tablet by mouth 3 (three) times daily as needed for heartburn.     . carvedilol (COREG) 6.25 MG tablet Take 0.5 tablets (3.125 mg total) by mouth 2 (two) times daily with a meal. 90 tablet 3  . cholecalciferol (VITAMIN D) 1000 UNITS tablet Take 1,000 Units by mouth daily.    Marland Kitchen docusate sodium (COLACE) 100 MG capsule Take 100 mg by mouth 2 (two) times daily as needed for mild constipation or moderate constipation.     Marland Kitchen lenalidomide (REVLIMID) 15 MG capsule Take 1 capsule (15 mg total) by mouth daily. 21 capsule 0  . loperamide (IMODIUM A-D) 2 MG tablet Take 2 mg by mouth 4 (four) times daily as needed for diarrhea or loose stools.    . mirtazapine (REMERON) 30 MG tablet TAKE 1 TABLET BY MOUTH AT BEDTIME (Patient taking differently: TAKE 1/2 TABLET BY MOUTH AT BEDTIME) 60 tablet 1  . Multiple Vitamin (MULTIVITAMIN WITH MINERALS) TABS tablet Take 1 tablet by mouth daily.    . ondansetron (ZOFRAN) 8 MG tablet Take 1 tablet (8 mg total) by mouth 2 (two) times daily. Start the day after chemo for 2 days. Then as needed for nausea or vomiting. 30 tablet 1  . OXYQUINOLONE SULFATE VAGINAL (TRIMO-SAN) 0.025 % GEL Place 1 application vaginally 2 (two) times a week.     No current facility-administered medications for this visit.    PHYSICAL EXAMINATION: ECOG PERFORMANCE STATUS: 1 - Symptomatic but completely ambulatory  Filed Vitals:   01/26/15 1025  BP: 145/76  Pulse: 100  Temp: 98.8 F (37.1 C)  Resp: 18   Filed Weights   01/26/15 1025  Weight: 158 lb 8 oz (71.895 kg)    GENERAL:alert, no distress and comfortable SKIN: skin color, texture, turgor are normal, no rashes or significant lesions EYES: normal, Conjunctiva are pink and non-injected, sclera clear Musculoskeletal:no cyanosis of digits and no clubbing  NEURO: alert & oriented x 3 with fluent speech, no focal motor/sensory  deficits  LABORATORY DATA:  I have reviewed the data as listed    Component Value Date/Time   NA 142 01/26/2015 0942   NA 139 01/03/2015 2021   K 4.0 01/26/2015 0942   K 4.0 01/03/2015 2021   CL 109 01/03/2015 2021   CO2 28 01/26/2015 0942   CO2 25 01/03/2015 2021   GLUCOSE 105 01/26/2015 0942   GLUCOSE 115* 01/03/2015 2021   BUN 14.9 01/26/2015 0942   BUN 13 01/03/2015 2021   CREATININE 0.8 01/26/2015 0942   CREATININE 0.68 01/03/2015 2021   CALCIUM 9.4 01/26/2015 0942   CALCIUM 8.3* 01/03/2015 2021   PROT 7.0 01/26/2015 0942   PROT 6.5 01/03/2015 2021   ALBUMIN 4.0 01/26/2015 0942   ALBUMIN 4.1 01/03/2015 2021   AST 15 01/26/2015 0942   AST 18 01/03/2015 2021   ALT 10 01/26/2015 0942   ALT 11* 01/03/2015 2021   ALKPHOS 45 01/26/2015 0942   ALKPHOS 47 01/03/2015 2021   BILITOT 0.54 01/26/2015 0942   BILITOT 0.6 01/03/2015 2021   GFRNONAA >60 01/03/2015 2021   GFRAA >60 01/03/2015 2021    No results found for: SPEP, UPEP  Lab Results  Component Value Date   WBC 2.9* 01/26/2015   NEUTROABS 1.6  01/26/2015   HGB 10.6* 01/26/2015   HCT 30.6* 01/26/2015   MCV 93.1 01/26/2015   PLT 45* 01/26/2015      Chemistry      Component Value Date/Time   NA 142 01/26/2015 0942   NA 139 01/03/2015 2021   K 4.0 01/26/2015 0942   K 4.0 01/03/2015 2021   CL 109 01/03/2015 2021   CO2 28 01/26/2015 0942   CO2 25 01/03/2015 2021   BUN 14.9 01/26/2015 0942   BUN 13 01/03/2015 2021   CREATININE 0.8 01/26/2015 0942   CREATININE 0.68 01/03/2015 2021      Component Value Date/Time   CALCIUM 9.4 01/26/2015 0942   CALCIUM 8.3* 01/03/2015 2021   ALKPHOS 45 01/26/2015 0942   ALKPHOS 47 01/03/2015 2021   AST 15 01/26/2015 0942   AST 18 01/03/2015 2021   ALT 10 01/26/2015 0942   ALT 11* 01/03/2015 2021   BILITOT 0.54 01/26/2015 0942   BILITOT 0.6 01/03/2015 2021      ASSESSMENT & PLAN:  Multiple myeloma Her treatment remained on hold due to persistent pancytopenia. I  will resume her treatment once her platelet count is above 50,000. The patient is disappointed to see her myeloma blood test is worse. If her platelet counts are better, I will resume Kyprolis only at reduced dose without Revlimid.  Anemia in neoplastic disease This is likely anemia of chronic disease. The patient denies recent history of bleeding such as epistaxis, hematuria or hematochezia. She is asymptomatic from the anemia. We will observe for now.  She does not require transfusion now.    Leukopenia due to antineoplastic chemotherapy  Further workup is pending. She is not symptomatic.   Thrombocytopenia due to drugs This is likely due to recent treatment. The patient denies recent history of bleeding such as epistaxis, hematuria or hematochezia. She is asymptomatic from the low platelet count. I will observe for now.  Plan to hold treatment now until platelet count is greater than 50,000     Essential hypertension  Her blood pressure medication has been placed on hold due to recent hypotension. Her blood pressure is a little elevated today. I recommend close monitoring for now and resumption of blood pressure medication if systolic blood pressures consistently over 140.  Other fatigue  The cause is unknown. I will check TSH level next visit.     Orders Placed This Encounter  Procedures  . Epstein barr vrs(ebv dna by pcr)    Standing Status: Future     Number of Occurrences: 1     Standing Expiration Date: 01/26/2016  . Human parvovirus DNA detection by PCR  . Human parvovirus DNA detection by PCR  . Epstein barr vrs(ebv dna by pcr)    Standing Status: Future     Number of Occurrences: 1     Standing Expiration Date: 01/26/2016  . Human parvovirus DNA detection by PCR  . Human parvovirus DNA detection by PCR  . TSH    Standing Status: Future     Number of Occurrences:      Standing Expiration Date: 03/01/2016   All questions were answered. The patient knows to call  the clinic with any problems, questions or concerns. No barriers to learning was detected. I spent 25 minutes counseling the patient face to face. The total time spent in the appointment was 30 minutes and more than 50% was on counseling and review of test results     [facility], Shayli Altemose, MD 01/26/2015 11:28 AM

## 2015-01-26 NOTE — Telephone Encounter (Signed)
Per staff message and POF I have scheduled appts. Advised scheduler of appts and to move labs. JMW  

## 2015-01-26 NOTE — Assessment & Plan Note (Signed)
The cause is unknown. I will check TSH level next visit.

## 2015-01-26 NOTE — Assessment & Plan Note (Signed)
Further workup is pending. She is not symptomatic.

## 2015-01-27 ENCOUNTER — Ambulatory Visit: Payer: Medicare HMO

## 2015-01-28 ENCOUNTER — Telehealth: Payer: Self-pay | Admitting: *Deleted

## 2015-01-28 ENCOUNTER — Encounter: Payer: Self-pay | Admitting: Hematology and Oncology

## 2015-01-28 ENCOUNTER — Other Ambulatory Visit: Payer: Self-pay | Admitting: Hematology and Oncology

## 2015-01-28 DIAGNOSIS — Z9481 Bone marrow transplant status: Secondary | ICD-10-CM

## 2015-01-28 DIAGNOSIS — B1081 Human herpesvirus 6 infection: Secondary | ICD-10-CM

## 2015-01-28 HISTORY — DX: Human herpesvirus 6 infection: B10.81

## 2015-01-28 LAB — OTHER SOLSTAS TEST

## 2015-01-28 NOTE — Telephone Encounter (Signed)
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 I sent ID consult as I do not know how to treat I sent it as urgent referral Please keep an eye on consult and let her know to expect a phone call ----- Message -----    From: [facility]    Sent: 01/28/2015   3:12 PM      To: [name], MD

## 2015-01-28 NOTE — Telephone Encounter (Signed)
[facility] called to confirm that Revlimid is still on hold

## 2015-01-29 ENCOUNTER — Telehealth: Payer: Self-pay | Admitting: *Deleted

## 2015-01-29 LAB — EPSTEIN BARR VIRUS DNA, QUANT RTPCR: EBV DNA, QN PCR: <200 copies/mL

## 2015-01-29 LAB — HUMAN PARVOVIRUS DNA DETECTION BY PCR: Parvovirus B19 DNA QL PCR: NOT DETECTED

## 2015-01-29 NOTE — Telephone Encounter (Signed)
Pt asks if she is contagious?  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   Informed pt it can be passed through blood or body fluids but not casual contact.  She verbalized understanding.

## 2015-01-29 NOTE — Telephone Encounter (Signed)
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   Informed pt of Dr. Calton Dach note below, to expect call from Infectious Disease clinic for appt.Marland Kitchen  Keep appt here on 8/9 for lab and she will not get chemo until seen by ID.  We will keep chemo appts in case pt needs transfusion next week.  Pt verbalized understanding.

## 2015-02-01 ENCOUNTER — Telehealth: Payer: Self-pay | Admitting: *Deleted

## 2015-02-01 LAB — OTHER SOLSTAS TEST

## 2015-02-01 NOTE — Telephone Encounter (Signed)
Pt left VM states she has not heard about appt w/ ID MD yet.  LVM on physicians line at Infectious Disease practice phone [phone].  Requested call back to confirm they received referral and inform it is urgent as pt's chemo on hold until she can see ID MD.  Requested call back to confirm they did get the referral sent by Dr. Alvy Bimler.

## 2015-02-02 ENCOUNTER — Ambulatory Visit (HOSPITAL_BASED_OUTPATIENT_CLINIC_OR_DEPARTMENT_OTHER): Payer: Medicare HMO

## 2015-02-02 ENCOUNTER — Telehealth: Payer: Self-pay | Admitting: Infectious Disease

## 2015-02-02 ENCOUNTER — Telehealth: Payer: Self-pay | Admitting: *Deleted

## 2015-02-02 ENCOUNTER — Other Ambulatory Visit: Payer: Self-pay | Admitting: Hematology and Oncology

## 2015-02-02 ENCOUNTER — Other Ambulatory Visit (HOSPITAL_BASED_OUTPATIENT_CLINIC_OR_DEPARTMENT_OTHER): Payer: Medicare HMO

## 2015-02-02 VITALS — BP 131/88 | HR 87 | Temp 99.2°F | Resp 18

## 2015-02-02 DIAGNOSIS — C9000 Multiple myeloma not having achieved remission: Secondary | ICD-10-CM | POA: Diagnosis not present

## 2015-02-02 DIAGNOSIS — R5383 Other fatigue: Secondary | ICD-10-CM

## 2015-02-02 DIAGNOSIS — D72819 Decreased white blood cell count, unspecified: Secondary | ICD-10-CM

## 2015-02-02 LAB — COMPREHENSIVE METABOLIC PANEL (CC13)
ALT: 9 U/L (ref 0–55)
AST: 14 U/L (ref 5–34)
Albumin: 3.9 g/dL (ref 3.5–5.0)
Alkaline Phosphatase: 38 U/L — ABNORMAL LOW (ref 40–150)
Anion Gap: 6 mEq/L (ref 3–11)
BUN: 12.1 mg/dL (ref 7.0–26.0)
CO2: 28 mEq/L (ref 22–29)
Calcium: 9 mg/dL (ref 8.4–10.4)
Chloride: 109 mEq/L (ref 98–109)
Creatinine: 0.7 mg/dL (ref 0.6–1.1)
EGFR: 83 mL/min/{1.73_m2} — ABNORMAL LOW (ref >90)
Glucose: 95 mg/dl (ref 70–140)
Potassium: 4.2 mEq/L (ref 3.5–5.1)
Sodium: 143 mEq/L (ref 136–145)
Total Bilirubin: 0.54 mg/dL (ref 0.20–1.20)
Total Protein: 6.6 g/dL (ref 6.4–8.3)

## 2015-02-02 LAB — CBC WITH DIFFERENTIAL/PLATELET
BASO%: 0 % (ref 0.0–2.0)
Basophils Absolute: 0 10*3/uL (ref 0.0–0.1)
EOS%: 0.9 % (ref 0.0–7.0)
Eosinophils Absolute: 0 10*3/uL (ref 0.0–0.5)
HCT: 26.1 % — ABNORMAL LOW (ref 34.8–46.6)
HGB: 8.8 g/dL — ABNORMAL LOW (ref 11.6–15.9)
LYMPH%: 40.1 % (ref 14.0–49.7)
MCH: 31.3 pg (ref 25.1–34.0)
MCHC: 33.7 g/dL (ref 31.5–36.0)
MCV: 92.9 fL (ref 79.5–101.0)
MONO#: 0.1 10*3/uL (ref 0.1–0.9)
MONO%: 4.1 % (ref 0.0–14.0)
NEUT#: 1.2 10*3/uL — ABNORMAL LOW (ref 1.5–6.5)
NEUT%: 54.9 % (ref 38.4–76.8)
Platelets: 49 10*3/uL — ABNORMAL LOW (ref 145–400)
RBC: 2.81 10*6/uL — ABNORMAL LOW (ref 3.70–5.45)
RDW: 17.1 % — ABNORMAL HIGH (ref 11.2–14.5)
WBC: 2.2 10*3/uL — ABNORMAL LOW (ref 3.9–10.3)
lymph#: 0.9 10*3/uL (ref 0.9–3.3)

## 2015-02-02 LAB — OTHER SOLSTAS TEST

## 2015-02-02 LAB — TSH CHCC: TSH: 1.343 m(IU)/L (ref 0.308–3.960)

## 2015-02-02 LAB — HOLD TUBE, BLOOD BANK

## 2015-02-02 MED ORDER — ZOLEDRONIC ACID 4 MG/100ML IV SOLN
4.0000 mg | Freq: Once | INTRAVENOUS | Status: AC
  Administered 2015-02-02: 4 mg via INTRAVENOUS
  Filled 2015-02-02: qty 100

## 2015-02-02 MED ORDER — SODIUM CHLORIDE 0.9 % IV SOLN
Freq: Once | INTRAVENOUS | Status: AC
  Administered 2015-02-02: 09:00:00 via INTRAVENOUS

## 2015-02-02 MED ORDER — TBO-FILGRASTIM 480 MCG/0.8ML ~~LOC~~ SOSY
480.0000 ug | PREFILLED_SYRINGE | Freq: Once | SUBCUTANEOUS | Status: AC
  Administered 2015-02-02: 480 ug via SUBCUTANEOUS
  Filled 2015-02-02: qty 0.8

## 2015-02-02 MED ORDER — HEPARIN SOD (PORK) LOCK FLUSH 100 UNIT/ML IV SOLN
500.0000 [IU] | Freq: Once | INTRAVENOUS | Status: AC | PRN
  Administered 2015-02-02: 500 [IU]
  Filled 2015-02-02: qty 5

## 2015-02-02 MED ORDER — SODIUM CHLORIDE 0.9 % IJ SOLN
10.0000 mL | INTRAMUSCULAR | Status: DC | PRN
  Administered 2015-02-02: 10 mL
  Filled 2015-02-02: qty 10

## 2015-02-02 NOTE — Telephone Encounter (Signed)
Dr. Alvy Bimler s/w Dr. [name] and he agreed to fit pt into schedule this Friday 8/12.   S/w pt in infusion room,  Per Dr. Alvy Bimler,  She can get Zometa but her chemo remains on hold d/t pancytopenia.  Pt also needs Granix today.   Notified Infusion RN, Amy.   Informed pt of above and she verbalized understanding.  She also verbalized a lot of anxiety about her blood counts dropping and chemo on hold for six weeks now.  Instructed pt to call us if any problems this week or if she does not hear about appt w/ Dr. [name] for Friday.

## 2015-02-02 NOTE — Telephone Encounter (Signed)
Ok thanks Michelle! 

## 2015-02-02 NOTE — Telephone Encounter (Signed)
Jean Davidson can you instead schedule this patient for Thursday at 9am and create a slot for me for that day  I actually have an appt on Friday I need to make

## 2015-02-02 NOTE — Patient Instructions (Signed)
Zoledronic Acid injection (Hypercalcemia, Oncology) What is this medicine? ZOLEDRONIC ACID (ZOE le dron ik AS id) lowers the amount of calcium loss from bone. It is used to treat too much calcium in your blood from cancer. It is also used to prevent complications of cancer that has spread to the bone. This medicine may be used for other purposes; ask your health care provider or pharmacist if you have questions. COMMON BRAND NAME(S): Zometa What should I tell my health care provider before I take this medicine? They need to know if you have any of these conditions: -aspirin-sensitive asthma -cancer, especially if you are receiving medicines used to treat cancer -dental disease or wear dentures -infection -kidney disease -receiving corticosteroids like dexamethasone or prednisone -an unusual or allergic reaction to zoledronic acid, other medicines, foods, dyes, or preservatives -pregnant or trying to get pregnant -breast-feeding How should I use this medicine? This medicine is for infusion into a vein. It is given by a health care professional in a hospital or clinic setting. Talk to your pediatrician regarding the use of this medicine in children. Special care may be needed. Overdosage: If you think you have taken too much of this medicine contact a poison control center or emergency room at once. NOTE: This medicine is only for you. Do not share this medicine with others. What if I miss a dose? It is important not to miss your dose. Call your doctor or health care professional if you are unable to keep an appointment. What may interact with this medicine? -certain antibiotics given by injection -NSAIDs, medicines for pain and inflammation, like ibuprofen or naproxen -some diuretics like bumetanide, furosemide -teriparatide -thalidomide This list may not describe all possible interactions. Give your health care provider a list of all the medicines, herbs, non-prescription drugs, or  dietary supplements you use. Also tell them if you smoke, drink alcohol, or use illegal drugs. Some items may interact with your medicine. What should I watch for while using this medicine? Visit your doctor or health care professional for regular checkups. It may be some time before you see the benefit from this medicine. Do not stop taking your medicine unless your doctor tells you to. Your doctor may order blood tests or other tests to see how you are doing. Women should inform their doctor if they wish to become pregnant or think they might be pregnant. There is a potential for serious side effects to an unborn child. Talk to your health care professional or pharmacist for more information. You should make sure that you get enough calcium and vitamin D while you are taking this medicine. Discuss the foods you eat and the vitamins you take with your health care professional. Some people who take this medicine have severe bone, joint, and/or muscle pain. This medicine may also increase your risk for jaw problems or a broken thigh bone. Tell your doctor right away if you have severe pain in your jaw, bones, joints, or muscles. Tell your doctor if you have any pain that does not go away or that gets worse. Tell your dentist and dental surgeon that you are taking this medicine. You should not have major dental surgery while on this medicine. See your dentist to have a dental exam and fix any dental problems before starting this medicine. Take good care of your teeth while on this medicine. Make sure you see your dentist for regular follow-up appointments. What side effects may I notice from receiving this medicine? Side effects that   you should report to your doctor or health care professional as soon as possible: -allergic reactions like skin rash, itching or hives, swelling of the face, lips, or tongue -anxiety, confusion, or depression -breathing problems -changes in vision -eye pain -feeling faint or  lightheaded, falls -jaw pain, especially after dental work -mouth sores -muscle cramps, stiffness, or weakness -trouble passing urine or change in the amount of urine Side effects that usually do not require medical attention (report to your doctor or health care professional if they continue or are bothersome): -bone, joint, or muscle pain -constipation -diarrhea -fever -hair loss -irritation at site where injected -loss of appetite -nausea, vomiting -stomach upset -trouble sleeping -trouble swallowing -weak or tired This list may not describe all possible side effects. Call your doctor for medical advice about side effects. You may report side effects to FDA at 1-800-FDA-1088. Where should I keep my medicine? This drug is given in a hospital or clinic and will not be stored at home. NOTE: This sheet is a summary. It may not cover all possible information. If you have questions about this medicine, talk to your doctor, pharmacist, or health care provider.  2015, Elsevier/Gold Standard. (2012-11-21 13:03:13)  Tbo-Filgrastim injection What is this medicine? TBO-FILGRASTIM (T B O fil GRA stim) is a granulocyte colony-stimulating factor that stimulates the growth of neutrophils, a type of white blood cell important in the body's fight against infection. It is used to reduce the incidence of fever and infection in patients with certain types of cancer who are receiving chemotherapy that affects the bone marrow. This medicine may be used for other purposes; ask your health care provider or pharmacist if you have questions. COMMON BRAND NAME(S): Granix What should I tell my health care provider before I take this medicine? They need to know if you have any of these conditions: -ongoing radiation therapy -sickle cell anemia -an unusual or allergic reaction to tbo-filgrastim, filgrastim, pegfilgrastim, other medicines, foods, dyes, or preservatives -pregnant or trying to get  pregnant -breast-feeding How should I use this medicine? This medicine is for injection under the skin. If you get this medicine at home, you will be taught how to prepare and give this medicine. Refer to the Instructions for Use that come with your medication packaging. Use exactly as directed. Take your medicine at regular intervals. Do not take your medicine more often than directed. It is important that you put your used needles and syringes in a special sharps container. Do not put them in a trash can. If you do not have a sharps container, call your pharmacist or healthcare provider to get one. Talk to your pediatrician regarding the use of this medicine in children. Special care may be needed. Overdosage: If you think you've taken too much of this medicine contact a poison control center or emergency room at once. Overdosage: If you think you have taken too much of this medicine contact a poison control center or emergency room at once. NOTE: This medicine is only for you. Do not share this medicine with others. What if I miss a dose? It is important not to miss your dose. Call your doctor or health care professional if you miss a dose. What may interact with this medicine? This medicine may interact with the following medications: -medicines that may cause a release of neutrophils, such as lithium This list may not describe all possible interactions. Give your health care provider a list of all the medicines, herbs, non-prescription drugs, or dietary supplements  you use. Also tell them if you smoke, drink alcohol, or use illegal drugs. Some items may interact with your medicine. What should I watch for while using this medicine? You may need blood work done while you are taking this medicine. What side effects may I notice from receiving this medicine? Side effects that you should report to your doctor or health care professional as soon as possible: -allergic reactions like skin rash,  itching or hives, swelling of the face, lips, or tongue -shortness of breath or breathing problems -fever -pain, redness, or irritation at site where injected -pinpoint red spots on the skin -stomach or side pain, or pain at the shoulder -swelling -tiredness -trouble passing urine Side effects that usually do not require medical attention (Report these to your doctor or health care professional if they continue or are bothersome.): -bone pain -muscle pain This list may not describe all possible side effects. Call your doctor for medical advice about side effects. You may report side effects to FDA at 1-800-FDA-1088. Where should I keep my medicine? Keep out of the reach of children. Store in a refrigerator between 2 and 8 degrees C (36 and 46 degrees F). Keep in carton to protect from light. Throw away this medicine if it is left out of the refrigerator for more than 5 consecutive days. Throw away any unused medicine after the expiration date. NOTE: This sheet is a summary. It may not cover all possible information. If you have questions about this medicine, talk to your doctor, pharmacist, or health care provider.  2015, Elsevier/Gold Standard. (2013-10-02 11:52:29)

## 2015-02-02 NOTE — Telephone Encounter (Signed)
I'll send this to [facility] to schedule her.

## 2015-02-02 NOTE — Telephone Encounter (Signed)
Done.  Confirmed with patient her appointment Thursday 8/11 9:00.

## 2015-02-02 NOTE — Telephone Encounter (Signed)
Can we schedule this patient with me this Friday as an emergency work in clinic pt for Friday am at [place]. 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

## 2015-02-02 NOTE — Progress Notes (Signed)
Per [facility]; Dr. Alvy Bimler; pt is to receive only zometa and Granix  today and hold chemo. Do not need to wait for [facility] to begin treatment per [facility].

## 2015-02-02 NOTE — Telephone Encounter (Signed)
Very good thanks Jean Davidson!

## 2015-02-03 ENCOUNTER — Ambulatory Visit: Payer: Medicare HMO

## 2015-02-04 ENCOUNTER — Encounter: Payer: Self-pay | Admitting: Hematology and Oncology

## 2015-02-04 ENCOUNTER — Other Ambulatory Visit: Payer: Self-pay | Admitting: Infectious Disease

## 2015-02-04 ENCOUNTER — Encounter: Payer: Self-pay | Admitting: Infectious Disease

## 2015-02-04 ENCOUNTER — Ambulatory Visit (INDEPENDENT_AMBULATORY_CARE_PROVIDER_SITE_OTHER): Payer: Medicare HMO | Admitting: Infectious Disease

## 2015-02-04 VITALS — BP 126/84 | HR 92 | Temp 98.3°F | Ht 63.0 in | Wt 157.0 lb

## 2015-02-04 DIAGNOSIS — D839 Common variable immunodeficiency, unspecified: Secondary | ICD-10-CM

## 2015-02-04 DIAGNOSIS — B1081 Human herpesvirus 6 infection: Secondary | ICD-10-CM | POA: Diagnosis not present

## 2015-02-04 DIAGNOSIS — D72819 Decreased white blood cell count, unspecified: Secondary | ICD-10-CM

## 2015-02-04 DIAGNOSIS — D701 Agranulocytosis secondary to cancer chemotherapy: Secondary | ICD-10-CM

## 2015-02-04 DIAGNOSIS — T451X5A Adverse effect of antineoplastic and immunosuppressive drugs, initial encounter: Secondary | ICD-10-CM

## 2015-02-04 DIAGNOSIS — Z9481 Bone marrow transplant status: Secondary | ICD-10-CM

## 2015-02-04 DIAGNOSIS — C9000 Multiple myeloma not having achieved remission: Secondary | ICD-10-CM | POA: Diagnosis not present

## 2015-02-04 DIAGNOSIS — D61818 Other pancytopenia: Secondary | ICD-10-CM

## 2015-02-04 DIAGNOSIS — R5383 Other fatigue: Secondary | ICD-10-CM

## 2015-02-04 DIAGNOSIS — D801 Nonfamilial hypogammaglobulinemia: Secondary | ICD-10-CM

## 2015-02-04 NOTE — Progress Notes (Signed)
Subjective:    Patient ID: Jean Davidson, female    DOB: 1945-09-15, 69 y.o.   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 Alvy Bimler  PCP: Dr. Diona Browner   HPI  69 year old with multiple myeloma diagnosed with abnormalities in cell lines and rash which resulted in SPEP, Bone marrrow biopsy.  Treatment of her MM is outlined from history copied from note from her Oncologist Dr Alvy Bimler below:    SUMMARY OF ONCOLOGIC HISTORY: Oncology History   Multiple myeloma, Ig A Lambda, M spike 3.54 grams, Calcium 9.2, Creatinine 0.8, Beta 2 microglobulin 4.52, IgA 4840 mg/dL, lambda light chain 75.4, albumin 3.6, hemoglobin 9.7, platelet 115   Primary site: Multiple Myeloma  Staging method: AJCC 6th Edition  Clinical: Stage IIA signed by [name], MD on 11/07/2013 2:46 PM  Summary: Stage IIA        Multiple myeloma   10/31/2013 Bone Marrow Biopsy Bone marrow biopsy confirmed multiple myeloma with 40% bone marrow involvement. Skeletal survey showed minimal lesions in her score with generalized demineralization   11/10/2013 - 02/13/2014 Chemotherapy The patient is started on induction chemotherapy with weekly dexamethasone 40 mg by mouth as well as Velcade subcutaneous injection on days 1, 4, 8 and 11. On 11/21/2013, she was started on monthly Zometa.   12/23/2013 Adverse Reaction The dose of Velcade was reduced due to thrombocytopenia.   01/28/2014 - 04/07/2014 Chemotherapy Revlimid is added. Treatment was discontinued due to lack of response.   02/24/2014 - 04/07/2014 Chemotherapy Due to worsening peripheral neuropathy, Velcade injection is changed to once a week. Revlimid was given 21 days on, 7 days off.   04/07/2014 - 04/10/2014 Chemotherapy Revlimid was discontinued due to lack of response. Chemotherapy was changed back to Velcade injection twice a week, 2 weeks on 1 week off. Her treatment was switched to to minimum  response   04/20/2014 - 06/02/2014 Chemotherapy chemotherapy is switched to Carfilzomib, Cytoxan and dexamethasone.   04/22/2014 Procedure she has placement of port for chemotherapy.   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    She also recorded our conversation during this visit because she stated that she otherwise has diffculty remembering what has been said.   Regardless the patient has not wanted to be followed at [facility] and instead wishes to have all of her oncologic care here in [place].   Her recent course has been complicated by acquired hypogammaglobulinemia and persistent pancytopenia.  Her chemotherapy has been on hold since June 2016  On July 28th her blood was checked for CMV PCR Not detected, EBV DNA <200, HHV 8 ND,  HHV 6 antibodies  which were high and HHV6 PCR that was above 1038  On that same day she received IVIG.  On August 2nd after she had received IVIG her HHV 6 PCR was again mistakenly measured and was<500  She was referred to [name] in [facility] for evaluation and possible treatment of her recent HHV 6 viremia.   Review of Systems  Constitutional: Positive for fatigue. Negative for fever,  chills, diaphoresis, activity change, appetite change and unexpected weight change.  HENT: Negative for congestion, rhinorrhea, sinus pressure, sneezing, sore throat and trouble swallowing.   Eyes: Negative for photophobia and visual disturbance.  Respiratory: Negative for cough, chest tightness, shortness of breath, wheezing and stridor.   Cardiovascular: Negative for chest pain, palpitations and leg swelling.  Gastrointestinal: Negative for nausea, vomiting, abdominal pain, diarrhea, constipation, blood in stool, abdominal distention and anal bleeding.  Genitourinary: Negative for dysuria, hematuria, flank pain and difficulty urinating.  Musculoskeletal: Negative for myalgias, back pain, joint swelling, arthralgias and gait problem.  Skin: Positive for rash. Negative for color change, pallor and wound.  Neurological: Positive for weakness and light-headedness. Negative for dizziness and tremors.  Hematological: Negative for adenopathy. Does not bruise/bleed easily.  Psychiatric/Behavioral: Negative for behavioral problems, confusion, sleep disturbance, dysphoric mood, decreased concentration and agitation.       Objective:   Physical Exam  Constitutional: She is oriented to person, place, and time. She appears well-developed and well-nourished. No distress.  HENT:  Head: Normocephalic and atraumatic.  Mouth/Throat: No oropharyngeal exudate.  Eyes: Conjunctivae and EOM are normal. No scleral icterus.  Neck: Normal range of motion. Neck supple.  Cardiovascular: Normal rate and regular rhythm.   Pulmonary/Chest: Effort normal. No respiratory distress. She has no wheezes.  Abdominal: She exhibits no distension.  Musculoskeletal: She exhibits no edema or tenderness.  Neurological: She is alert and oriented to person, place, and time. She exhibits normal muscle tone. Coordination normal.  Skin: Skin is warm and dry. She is not diaphoretic. No erythema. No pallor.  Psychiatric: She has a  normal mood and affect. Her behavior is normal. Judgment and thought content normal.    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   From literature it appears that HHV 6 viremia has been associated with delayed engraftment with transplant as well as pancytopenia as well in particular an association with need for increased platelet transfusions  I am uncertain as to role her recent IVIG infusion may have played in rendering her HHV 6 Viremia undetectable.  I am checking HHV 6 PCR again today  I will reach out to a transplant ID friend of mind at [facility], Dr Ricky Stabs who helps run the Transplant ID program at [facility] to see how they have been managing patients with HHV 6 viremia in autologous stem cell transplant world.   Should such patients with viremia be given Valgancyclovir or gancyclovir? These drugs can themselves have problems for cell lines including leukopenia  Pancytopenia: See discussion above. I will also test her for HIV as I do not see that it was done at [facility] or here. I will also recheck CMV PCR  I spent greater than 60 minutes with the patient including greater than 50% of time in face to  face counsel of the patient and in coordination of their care.

## 2015-02-05 ENCOUNTER — Ambulatory Visit: Payer: Medicare HMO | Admitting: Infectious Disease

## 2015-02-05 ENCOUNTER — Encounter: Payer: Self-pay | Admitting: Hematology and Oncology

## 2015-02-05 LAB — HIV ANTIBODY (ROUTINE TESTING W REFLEX): HIV 1&2 Ab, 4th Generation: NONREACTIVE

## 2015-02-06 LAB — CMV (CYTOMEGALOVIRUS) DNA ULTRAQUANT, PCR: CMV DNA Quant: <200 copies/mL (ref <200)

## 2015-02-08 ENCOUNTER — Encounter: Payer: Self-pay | Admitting: Cardiovascular Disease

## 2015-02-08 ENCOUNTER — Ambulatory Visit (INDEPENDENT_AMBULATORY_CARE_PROVIDER_SITE_OTHER): Payer: Medicare HMO | Admitting: Cardiovascular Disease

## 2015-02-08 VITALS — BP 122/62 | HR 86 | Ht 63.0 in | Wt 157.0 lb

## 2015-02-08 DIAGNOSIS — I10 Essential (primary) hypertension: Secondary | ICD-10-CM

## 2015-02-08 DIAGNOSIS — R002 Palpitations: Secondary | ICD-10-CM | POA: Diagnosis not present

## 2015-02-08 DIAGNOSIS — I6523 Occlusion and stenosis of bilateral carotid arteries: Secondary | ICD-10-CM

## 2015-02-08 NOTE — Progress Notes (Signed)
HPI  This is a 69 year old female who is here today for a followup visit regarding palpitations due to premature beats. She has family history of premature coronary artery disease.  She was seen in 2013 for palpitations and exertional dyspnea. A Holter monitor  showed one PVC as well as few PACs. An echocardiogram overall was normal. A treadmill stress test was normal. Due to hypertension and palpitations, she was started on carvedilol. No cardiac events since then. Unfortunately, she continues to deal with multiple myeloma with failure of treatment. She underwent bone marrow transplant in March at [facility] but she had relapse of disease and needs to go back on chemotherapy according to her. She is extremely anxious and stressed. Palpitations have been reasonably controlled on carvedilol but when she is stressed, her blood pressure goes up. She also complains of pulsatile tinnitus in the left ear. She does have mild bilateral carotid disease with tortuosity noted.     Allergies  Allergen Reactions  . Penicillins     UNKNOWN-CHILDHOOD ALLERGY  . Sulfa Antibiotics     UNKNOWN-CHILDHOOD ALLERGY     Current Outpatient Prescriptions on File Prior to Visit  Medication Sig Dispense Refill  . acetaminophen (TYLENOL) 500 MG tablet Take 1,500 mg by mouth as needed for moderate pain, fever or headache.     Marland Kitchen acyclovir (ZOVIRAX) 400 MG tablet Take 1 tablet (400 mg total) by mouth 2 (two) times daily. 180 tablet 3  . calcium carbonate (TUMS EX) 750 MG chewable tablet Chew 1 tablet by mouth 3 (three) times daily as needed for heartburn.     . carvedilol (COREG) 6.25 MG tablet Take 0.5 tablets (3.125 mg total) by mouth 2 (two) times daily with a meal. 90 tablet 3  . cholecalciferol (VITAMIN D) 1000 UNITS tablet Take 1,000 Units by mouth daily.    Marland Kitchen docusate sodium (COLACE) 100 MG capsule Take 100 mg by mouth 2 (two) times daily as needed for mild constipation or moderate constipation.     Marland Kitchen loperamide  (IMODIUM A-D) 2 MG tablet Take 2 mg by mouth 4 (four) times daily as needed for diarrhea or loose stools.    . mirtazapine (REMERON) 30 MG tablet TAKE 1 TABLET BY MOUTH AT BEDTIME (Patient taking differently: TAKE 1/2 TABLET BY MOUTH AT BEDTIME) 60 tablet 1  . Multiple Vitamin (MULTIVITAMIN WITH MINERALS) TABS tablet Take 1 tablet by mouth daily.    . ondansetron (ZOFRAN) 8 MG tablet Take 1 tablet (8 mg total) by mouth 2 (two) times daily. Start the day after chemo for 2 days. Then as needed for nausea or vomiting. 30 tablet 1  . OXYQUINOLONE SULFATE VAGINAL (TRIMO-SAN) 0.025 % GEL Place 1 application vaginally 2 (two) times a week.    . lenalidomide (REVLIMID) 15 MG capsule Take 1 capsule (15 mg total) by mouth daily. (Patient not taking: Reported on 02/04/2015) 21 capsule 0   No current facility-administered medications on file prior to visit.     Past Medical History  Diagnosis Date  . Cervical disc disease     c7  . Spinal stenosis in cervical region   . Carpal tunnel syndrome   . Pinched nerve   . Varicose veins of lower extremities with inflammation   . Disc degeneration   . Hypertension     Borderline.  . Carotid stenosis     Mild bilateral  . Anemia   . Anxiety   . GERD (gastroesophageal reflux disease)   . Cancer   .  Failure of stem cell transplant   . Fatigue 01/26/2015  . 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 Colonoscopy    . Bladder tact  1988/1989    x2  . Shoulder surgery Right 04/06/13    right  . Port-a-cath removal       Family History  Problem Relation Age of Onset  . Heart disease Mother   . Heart failure Father   . Heart disease Father   . Hypertension Brother   . Heart disease Brother     congenital shunt, ?   . Colon cancer Paternal Aunt 57  . Throat cancer Father   . Skin cancer Father   . Skin cancer Brother   . Diabetes Father   . Kidney disease Father      Social History   Social  History  . Marital Status: Divorced    Spouse Name: N/A  . Number of Children: N/A  . Years of Education: N/A   Occupational History  . Not on file.   Social History Main Topics  . Smoking status: Former Smoker -- 0.50 packs/day for 4 years    Quit date: 06/26/1968  . Smokeless tobacco: Never Used  . Alcohol Use: 0.6 oz/week    1 Cans of beer per week     Comment: minimal  . Drug Use: No  . Sexual Activity: Not on file   Other Topics Concern  . Not on file   Social History Narrative   Chauncey Reading is daughter aryahna, spagna will,  full code ( reviewed 78)       PHYSICAL EXAM   BP 122/62 mmHg  Pulse 86  Ht _0  (1.6 m)  Wt 157 lb (71.215 kg)  BMI 27.82 kg/m2 Constitutional: She is oriented to person, place, and time. She appears well-developed and well-nourished. No distress.  HENT: No nasal discharge.  Head: Normocephalic and atraumatic.  Eyes: Pupils are equal and round. Right eye exhibits no discharge. Left eye exhibits no discharge.  Neck: Normal range of motion. Neck supple. No JVD present. No thyromegaly present.  Cardiovascular: Normal rate, regular rhythm, normal heart sounds. Exam reveals no gallop and no friction rub. No murmur heard.  Pulmonary/Chest: Effort normal and breath sounds normal. No stridor. No respiratory distress. She has no wheezes. She has no rales. She exhibits no tenderness.  Abdominal: Soft. Bowel sounds are normal. She exhibits no distension. There is no tenderness. There is no rebound and no guarding.  Musculoskeletal: Normal range of motion. She exhibits no edema and no tenderness.  Neurological: She is alert and oriented to person, place, and time. Coordination normal.  Skin: Skin is warm and dry. No rash noted. She is not diaphoretic. No erythema. No pallor.  Psychiatric: She has a normal mood and affect. Her behavior is normal. Judgment and thought content normal.    KDT:OIZTI  Rhythm  WITHIN NORMAL LIMITS  ASSESSMENT AND  PLAN

## 2015-02-08 NOTE — Patient Instructions (Signed)
Medication Instructions: Continue same medications. You can take a full dose Carvedilol if needed for blood pressure or heart rate  Labwork: None.   Procedures/Testing: None.   Follow-Up: 6 months with Dr. Fletcher Anon.   Any Additional Special Instructions Will Be Listed Below (If Applicable).

## 2015-02-08 NOTE — Assessment & Plan Note (Signed)
Controlled with small dose carvedilol.

## 2015-02-08 NOTE — Assessment & Plan Note (Signed)
Blood pressure is well controlled on small dose carvedilol. On occasions, blood pressure goes up especially when she is under stress. I asked her to take a full dose 6.25 mg on these occasions.

## 2015-02-08 NOTE — Assessment & Plan Note (Signed)
This is being followed by Dr. dew and was mild on most recent carotid Doppler in March of this year.

## 2015-02-09 ENCOUNTER — Ambulatory Visit (HOSPITAL_BASED_OUTPATIENT_CLINIC_OR_DEPARTMENT_OTHER): Payer: Medicare HMO

## 2015-02-09 ENCOUNTER — Ambulatory Visit: Payer: Medicare HMO

## 2015-02-09 ENCOUNTER — Ambulatory Visit (HOSPITAL_COMMUNITY)
Admission: RE | Admit: 2015-02-09 | Discharge: 2015-02-09 | Disposition: A | Discharge status: Home / Self Care | Payer: Medicare HMO | Source: Ambulatory Visit | Attending: Hematology and Oncology | Admitting: Hematology and Oncology

## 2015-02-09 ENCOUNTER — Other Ambulatory Visit: Payer: Self-pay | Admitting: Hematology and Oncology

## 2015-02-09 ENCOUNTER — Telehealth: Payer: Self-pay | Admitting: *Deleted

## 2015-02-09 ENCOUNTER — Other Ambulatory Visit (HOSPITAL_BASED_OUTPATIENT_CLINIC_OR_DEPARTMENT_OTHER): Payer: Medicare HMO

## 2015-02-09 ENCOUNTER — Encounter: Payer: Self-pay | Admitting: Hematology and Oncology

## 2015-02-09 ENCOUNTER — Ambulatory Visit (HOSPITAL_BASED_OUTPATIENT_CLINIC_OR_DEPARTMENT_OTHER): Payer: Medicare HMO | Admitting: Hematology and Oncology

## 2015-02-09 VITALS — BP 133/58 | HR 90 | Temp 98.5°F | Resp 18 | Ht 63.0 in | Wt 157.9 lb

## 2015-02-09 VITALS — BP 118/62 | HR 76 | Temp 98.1°F | Resp 18

## 2015-02-09 DIAGNOSIS — T50905A Adverse effect of unspecified drugs, medicaments and biological substances, initial encounter: Secondary | ICD-10-CM

## 2015-02-09 DIAGNOSIS — D630 Anemia in neoplastic disease: Secondary | ICD-10-CM

## 2015-02-09 DIAGNOSIS — C9000 Multiple myeloma not having achieved remission: Secondary | ICD-10-CM | POA: Diagnosis not present

## 2015-02-09 DIAGNOSIS — D839 Common variable immunodeficiency, unspecified: Secondary | ICD-10-CM

## 2015-02-09 DIAGNOSIS — D801 Nonfamilial hypogammaglobulinemia: Secondary | ICD-10-CM

## 2015-02-09 DIAGNOSIS — Z23 Encounter for immunization: Secondary | ICD-10-CM

## 2015-02-09 DIAGNOSIS — Z5112 Encounter for antineoplastic immunotherapy: Secondary | ICD-10-CM | POA: Diagnosis not present

## 2015-02-09 DIAGNOSIS — D701 Agranulocytosis secondary to cancer chemotherapy: Secondary | ICD-10-CM

## 2015-02-09 DIAGNOSIS — T451X5A Adverse effect of antineoplastic and immunosuppressive drugs, initial encounter: Secondary | ICD-10-CM

## 2015-02-09 DIAGNOSIS — B1081 Human herpesvirus 6 infection: Secondary | ICD-10-CM | POA: Diagnosis not present

## 2015-02-09 DIAGNOSIS — D72819 Decreased white blood cell count, unspecified: Secondary | ICD-10-CM

## 2015-02-09 DIAGNOSIS — Z9481 Bone marrow transplant status: Secondary | ICD-10-CM

## 2015-02-09 DIAGNOSIS — D6959 Other secondary thrombocytopenia: Secondary | ICD-10-CM

## 2015-02-09 LAB — CBC WITH DIFFERENTIAL/PLATELET
BASO%: 0.3 % (ref 0.0–2.0)
Basophils Absolute: 0 10*3/uL (ref 0.0–0.1)
EOS%: 0.5 % (ref 0.0–7.0)
Eosinophils Absolute: 0 10*3/uL (ref 0.0–0.5)
HCT: 23 % — ABNORMAL LOW (ref 34.8–46.6)
HGB: 7.9 g/dL — ABNORMAL LOW (ref 11.6–15.9)
LYMPH%: 46.8 % (ref 14.0–49.7)
MCH: 31.3 pg (ref 25.1–34.0)
MCHC: 34.2 g/dL (ref 31.5–36.0)
MCV: 91.5 fL (ref 79.5–101.0)
MONO#: 0.1 10*3/uL (ref 0.1–0.9)
MONO%: 3.9 % (ref 0.0–14.0)
NEUT#: 1.2 10*3/uL — ABNORMAL LOW (ref 1.5–6.5)
NEUT%: 48.5 % (ref 38.4–76.8)
Platelets: 63 10*3/uL — ABNORMAL LOW (ref 145–400)
RBC: 2.52 10*6/uL — ABNORMAL LOW (ref 3.70–5.45)
RDW: 18.1 % — ABNORMAL HIGH (ref 11.2–14.5)
WBC: 2.6 10*3/uL — ABNORMAL LOW (ref 3.9–10.3)
lymph#: 1.2 10*3/uL (ref 0.9–3.3)

## 2015-02-09 LAB — COMPREHENSIVE METABOLIC PANEL (CC13)
ALT: 12 U/L (ref 0–55)
AST: 14 U/L (ref 5–34)
Albumin: 4.1 g/dL (ref 3.5–5.0)
Alkaline Phosphatase: 45 U/L (ref 40–150)
Anion Gap: 8 mEq/L (ref 3–11)
BUN: 10.4 mg/dL (ref 7.0–26.0)
CO2: 27 mEq/L (ref 22–29)
Calcium: 9.4 mg/dL (ref 8.4–10.4)
Chloride: 104 mEq/L (ref 98–109)
Creatinine: 0.8 mg/dL (ref 0.6–1.1)
EGFR: 78 mL/min/{1.73_m2} — ABNORMAL LOW (ref >90)
Glucose: 105 mg/dl (ref 70–140)
Potassium: 4.2 mEq/L (ref 3.5–5.1)
Sodium: 140 mEq/L (ref 136–145)
Total Bilirubin: 0.4 mg/dL (ref 0.20–1.20)
Total Protein: 6.9 g/dL (ref 6.4–8.3)

## 2015-02-09 LAB — PREPARE RBC (CROSSMATCH)

## 2015-02-09 LAB — HOLD TUBE, BLOOD BANK

## 2015-02-09 MED ORDER — SODIUM CHLORIDE 0.9 % IV SOLN
Freq: Once | INTRAVENOUS | Status: AC
  Administered 2015-02-09: 13:00:00 via INTRAVENOUS

## 2015-02-09 MED ORDER — CARFILZOMIB CHEMO INJECTION 60 MG
10.0000 mg/m2 | Freq: Once | INTRAVENOUS | Status: AC
  Administered 2015-02-09: 18 mg via INTRAVENOUS
  Filled 2015-02-09: qty 9

## 2015-02-09 MED ORDER — DIPHENHYDRAMINE HCL 25 MG PO CAPS
ORAL_CAPSULE | ORAL | Status: AC
  Filled 2015-02-09: qty 1

## 2015-02-09 MED ORDER — ACETAMINOPHEN 325 MG PO TABS
ORAL_TABLET | ORAL | Status: AC
  Filled 2015-02-09: qty 2

## 2015-02-09 MED ORDER — HEPARIN SOD (PORK) LOCK FLUSH 100 UNIT/ML IV SOLN
500.0000 [IU] | Freq: Once | INTRAVENOUS | Status: AC | PRN
  Administered 2015-02-09: 500 [IU]
  Filled 2015-02-09: qty 5

## 2015-02-09 MED ORDER — SODIUM CHLORIDE 0.9 % IJ SOLN
10.0000 mL | INTRAMUSCULAR | Status: DC | PRN
  Administered 2015-02-09: 10 mL
  Filled 2015-02-09: qty 10

## 2015-02-09 MED ORDER — DIPHENHYDRAMINE HCL 25 MG PO CAPS
25.0000 mg | ORAL_CAPSULE | Freq: Once | ORAL | Status: AC
  Administered 2015-02-09: 25 mg via ORAL

## 2015-02-09 MED ORDER — SODIUM CHLORIDE 0.9 % IV SOLN
Freq: Once | INTRAVENOUS | Status: AC
  Administered 2015-02-09: 13:00:00 via INTRAVENOUS
  Filled 2015-02-09: qty 4

## 2015-02-09 MED ORDER — ACETAMINOPHEN 325 MG PO TABS
650.0000 mg | ORAL_TABLET | Freq: Once | ORAL | Status: AC
  Administered 2015-02-09: 650 mg via ORAL

## 2015-02-09 MED ORDER — PNEUMOCOCCAL 13-VAL CONJ VACC IM SUSP
0.5000 mL | Freq: Once | INTRAMUSCULAR | Status: AC
  Administered 2015-02-09: 0.5 mL via INTRAMUSCULAR
  Filled 2015-02-09: qty 0.5

## 2015-02-09 NOTE — Progress Notes (Signed)
OK to treat with today's labs 

## 2015-02-09 NOTE — Assessment & Plan Note (Signed)
This is related to her bone marrow corresponds status and recent treatment. She is not fighting off infection well. I discussed with her the role of IVIG for primary immunodeficiency. Risk, benefit, side effects of IVIG was discussed and she agreed to proceed. We will continue 0.5 g/kg every 4 weeks.

## 2015-02-09 NOTE — Progress Notes (Signed)
[place] OFFICE PROGRESS NOTE  Patient Care Team: Jinny Sanders, MD as PCP - General (Family Medicine) [name], MD as Referring Physician (Hematology and Oncology) [name], MD as Consulting Physician (Internal Medicine) Truman Hayward, MD as Consulting Physician (Infectious Diseases)  SUMMARY OF ONCOLOGIC HISTORY: Oncology History   Multiple myeloma, Ig A Lambda, M spike 3.54 grams, Calcium 9.2, Creatinine 0.8, Beta 2 microglobulin 4.52, IgA 4840 mg/dL, lambda light chain 75.4, albumin 3.6, hemoglobin 9.7, platelet 115    Primary site: Multiple Myeloma   Staging method: AJCC 6th Edition   Clinical: Stage IIA signed by [name], MD on 11/07/2013  2:46 PM   Summary: Stage IIA        Multiple myeloma   10/31/2013 Bone Marrow Biopsy Bone marrow biopsy confirmed multiple myeloma with 40% bone marrow involvement. Skeletal survey showed minimal lesions in her score with generalized demineralization   11/10/2013 - 02/13/2014 Chemotherapy The patient is started on induction chemotherapy with weekly dexamethasone 40 mg by mouth as well as Velcade subcutaneous injection on days 1, 4, 8 and 11. On 11/21/2013, she was started on monthly Zometa.   12/23/2013 Adverse Reaction The dose of Velcade was reduced due to thrombocytopenia.   01/28/2014 - 04/07/2014 Chemotherapy Revlimid is added. Treatment was discontinued due to lack of response.   02/24/2014 - 04/07/2014 Chemotherapy Due to worsening peripheral neuropathy, Velcade injection is changed to once a week. Revlimid was given 21 days on, 7 days off.   04/07/2014 - 04/10/2014 Chemotherapy Revlimid was discontinued due to lack of response. Chemotherapy was changed back to Velcade injection twice a week, 2 weeks on 1 week off. Her treatment was switched to to minimum response   04/20/2014 - 06/02/2014 Chemotherapy chemotherapy is switched to Carfilzomib, Cytoxan and dexamethasone.   04/22/2014 Procedure she has  placement of port for chemotherapy.   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 She returns for further follow-up. She started to complain of palpitation and fatigue. She denies recent infection. The patient denies any recent signs or symptoms of bleeding such as spontaneous epistaxis, hematuria or hematochezia.  REVIEW OF SYSTEMS:   Constitutional: Denies fevers, chills or abnormal weight loss Eyes: Denies blurriness of vision Ears, nose, mouth, throat, and face: Denies mucositis or sore throat Respiratory: Denies cough, dyspnea or wheezes Cardiovascular: Denies palpitation, chest discomfort or lower extremity swelling Gastrointestinal:  Denies nausea, heartburn or change in bowel habits Skin: Denies abnormal skin rashes Lymphatics: Denies new lymphadenopathy or easy bruising Neurological:Denies numbness, tingling or new weaknesses Behavioral/Psych: Mood is stable, no new changes  All other systems were reviewed with the patient and are negative.  I have reviewed the past medical history, past surgical history, social history and family history with the patient and they are unchanged from previous note.  ALLERGIES:  is allergic to penicillins and sulfa antibiotics.  MEDICATIONS:  Current Outpatient Prescriptions  Medication Sig Dispense Refill  . acetaminophen (TYLENOL) 500 MG tablet Take 1,500 mg by mouth as needed for  moderate pain, fever or headache.     Marland Kitchen acyclovir (ZOVIRAX) 400 MG  tablet Take 1 tablet (400 mg total) by mouth 2 (two) times daily. 180 tablet 3  . calcium carbonate (TUMS EX) 750 MG chewable tablet Chew 1 tablet by mouth 3 (three) times daily as needed for heartburn.     . carvedilol (COREG) 6.25 MG tablet Take 0.5 tablets (3.125 mg total) by mouth 2 (two) times daily with a meal. 90 tablet 3  . cholecalciferol (VITAMIN D) 1000 UNITS tablet Take 1,000 Units by mouth daily.    Marland Kitchen docusate sodium (COLACE) 100 MG capsule Take 100 mg by mouth 2 (two) times daily as needed for mild constipation or moderate constipation.     Marland Kitchen loperamide (IMODIUM A-D) 2 MG tablet Take 2 mg by mouth 4 (four) times daily as needed for diarrhea or loose stools.    . mirtazapine (REMERON) 30 MG tablet TAKE 1 TABLET BY MOUTH AT BEDTIME (Patient taking differently: TAKE 1/2 TABLET BY MOUTH AT BEDTIME) 60 tablet 1  . Multiple Vitamin (MULTIVITAMIN WITH MINERALS) TABS tablet Take 1 tablet by mouth daily.    . ondansetron (ZOFRAN) 8 MG tablet Take 1 tablet (8 mg total) by mouth 2 (two) times daily. Start the day after chemo for 2 days. Then as needed for nausea or vomiting. 30 tablet 1  . OXYQUINOLONE SULFATE VAGINAL (TRIMO-SAN) 0.025 % GEL Place 1 application vaginally 2 (two) times a week.    . lenalidomide (REVLIMID) 15 MG capsule Take 1 capsule (15 mg total) by mouth daily. (Patient not taking: Reported on 02/04/2015) 21 capsule 0   No current facility-administered medications for this visit.   Facility-Administered Medications Ordered in Other Visits  Medication Dose Route Frequency Provider Last Rate Last Dose  . 0.9 %  sodium chloride infusion   Intravenous Once [name], MD      . carfilzomib (KYPROLIS) 18 mg in dextrose 5 % 50 mL chemo infusion  10 mg/m2 (Treatment Plan Actual) Intravenous Once [name], MD      . heparin lock flush 100 unit/mL  500 Units Intracatheter Once PRN [name], MD      . ondansetron (ZOFRAN) 8 mg, dexamethasone (DECADRON) 5 mg in sodium chloride 0.9 %  50 mL IVPB   Intravenous Once [name], MD      . sodium chloride 0.9 % injection 10 mL  10 mL Intracatheter PRN [name], MD        PHYSICAL EXAMINATION: ECOG PERFORMANCE STATUS: 1 - Symptomatic but completely ambulatory  Filed Vitals:   02/09/15 1116  BP: 133/58  Pulse: 90  Temp: 98.5 F (36.9 C)  Resp: 18   Filed Weights   02/09/15 1116  Weight: 157 lb 14.4 oz (71.623 kg)    GENERAL:alert, no distress and comfortable SKIN: There were no rashes. I noticed livedo reticularis.  EYES: normal, Conjunctiva are pink and non-injected, sclera clear OROPHARYNX:no exudate, no erythema and lips, buccal mucosa, and tongue normal  Musculoskeletal:no cyanosis of digits and no clubbing  NEURO: alert & oriented x 3 with fluent speech, no focal motor/sensory deficits  LABORATORY DATA:  I have reviewed the data as listed    Component Value Date/Time   NA 140 02/09/2015 1041   NA 139 01/03/2015 2021   K 4.2 02/09/2015 1041   K 4.0 01/03/2015 2021   CL 109 01/03/2015 2021   CO2 27 02/09/2015 1041   CO2 25 01/03/2015 2021   GLUCOSE 105 02/09/2015  1041   GLUCOSE 115* 01/03/2015 2021   BUN 10.4 02/09/2015 1041   BUN 13 01/03/2015 2021   CREATININE 0.8 02/09/2015 1041   CREATININE 0.68 01/03/2015 2021   CALCIUM 9.4 02/09/2015 1041   CALCIUM 8.3* 01/03/2015 2021   PROT 6.9 02/09/2015 1041   PROT 6.5 01/03/2015 2021   ALBUMIN 4.1 02/09/2015 1041   ALBUMIN 4.1 01/03/2015 2021   AST 14 02/09/2015 1041   AST 18 01/03/2015 2021   ALT 12 02/09/2015 1041   ALT 11* 01/03/2015 2021   ALKPHOS 45 02/09/2015 1041   ALKPHOS 47 01/03/2015 2021   BILITOT 0.40 02/09/2015 1041   BILITOT 0.6 01/03/2015 2021   GFRNONAA >60 01/03/2015 2021   GFRAA >60 01/03/2015 2021    No results found for: SPEP, UPEP  Lab Results  Component Value Date   WBC 2.6* 02/09/2015   NEUTROABS 1.2* 02/09/2015   HGB 7.9* 02/09/2015   HCT 23.0* 02/09/2015   MCV 91.5 02/09/2015   PLT 63* 02/09/2015       Chemistry      Component Value Date/Time   NA 140 02/09/2015 1041   NA 139 01/03/2015 2021   K 4.2 02/09/2015 1041   K 4.0 01/03/2015 2021   CL 109 01/03/2015 2021   CO2 27 02/09/2015 1041   CO2 25 01/03/2015 2021   BUN 10.4 02/09/2015 1041   BUN 13 01/03/2015 2021   CREATININE 0.8 02/09/2015 1041   CREATININE 0.68 01/03/2015 2021      Component Value Date/Time   CALCIUM 9.4 02/09/2015 1041   CALCIUM 8.3* 01/03/2015 2021   ALKPHOS 45 02/09/2015 1041   ALKPHOS 47 01/03/2015 2021   AST 14 02/09/2015 1041   AST 18 01/03/2015 2021   ALT 12 02/09/2015 1041   ALT 11* 01/03/2015 2021   BILITOT 0.40 02/09/2015 1041   BILITOT 0.6 01/03/2015 2021     ASSESSMENT & PLAN:  Multiple myeloma Her treatment was placed on hold for wall due to persistent pancytopenia. I will resume her treatment today now that her platelet count and ANC has improved. I will hold Revlimid. I will reduce Kyprolis by 50%. 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 She is not fighting off infection well. I discussed with her the role of IVIG for primary immunodeficiency. Risk, benefit, side effects of IVIG was discussed and she agreed to proceed. We will continue 0.5 g/kg every 4 weeks.   Anemia in neoplastic disease We discussed some of the risks, benefits, and alternatives of blood transfusions. The patient is symptomatic from anemia and the hemoglobin level is critically low.  Some of the side-effects to be expected including risks of transfusion reactions, chills, infection, syndrome of volume overload and risk of hospitalization from various reasons and the patient is willing to proceed and went ahead to sign consent today.   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 Her recent viral panel came back abnormal. She has seen a local infectious disease consultant who recommended her to go back to [facility] for transplant ID consultation. I discussed with the patient importance of this evaluation. It is likely that she may need a repeat bone marrow aspirate and biopsy as well.    Leukopenia due to antineoplastic chemotherapy She has persistent leukopenia related to recent treatment. She will  get chemotherapy as well as ANC is greater than 1000. If her [place] dropped below 1000, she will get G-CSF.   Thrombocytopenia due to drugs This is likely due to recent treatment. The patient denies recent history of bleeding such as epistaxis, hematuria or hematochezia. She is asymptomatic from the low platelet count. I will observe for now.  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 I recommend transplant ID consultation and she agreed to proceed   No orders of the defined types were placed in this encounter.   All questions were answered. The patient knows to call the clinic with any problems, questions or concerns. No barriers to learning was detected. I spent 25 minutes counseling the patient face to face. The total time spent in the appointment was 40 minutes and more than 50% was on counseling and review of test results     [facility], Bj Morlock, MD 02/09/2015 1:03 PM

## 2015-02-09 NOTE — Assessment & Plan Note (Signed)
Her treatment was placed on hold for wall due to persistent pancytopenia. I will resume her treatment today now that her platelet count and ANC has improved. I will hold Revlimid. I will reduce Kyprolis by 50%. I will refer her back to [facility] for further evaluation, specifically to address whether HHV6 infection would have caused her persistent pancytopenia

## 2015-02-09 NOTE — Assessment & Plan Note (Signed)
She has persistent leukopenia related to recent treatment. She will get chemotherapy as well as ANC is greater than 1000. If her [place] dropped below 1000, she will get G-CSF.

## 2015-02-09 NOTE — Patient Instructions (Signed)
Blood Transfusion Information WHAT IS A BLOOD TRANSFUSION? A transfusion is the replacement of blood or some of its parts. Blood is made up of multiple cells which provide different functions. 1. Red blood cells carry oxygen and are used for blood loss replacement. 2. White blood cells fight against infection. 3. Platelets control bleeding. 4. Plasma helps clot blood. 5. Other blood products are available for specialized needs, such as hemophilia or other clotting disorders. BEFORE THE TRANSFUSION  Who gives blood for transfusions?  1. You may be able to donate blood to be used at a later date on yourself (autologous donation). 2. Relatives can be asked to donate blood. This is generally not any safer than if you have received blood from a stranger. The same precautions are taken to ensure safety when a relative's blood is donated. 3. Healthy volunteers who are fully evaluated to make sure their blood is safe. This is blood bank blood. Transfusion therapy is the safest it has ever been in the practice of medicine. Before blood is taken from a donor, a complete history is taken to make sure that person has no history of diseases nor engages in risky social behavior (examples are intravenous drug use or sexual activity with multiple partners). The donor's travel history is screened to minimize risk of transmitting infections, such as malaria. The donated blood is tested for signs of infectious diseases, such as HIV and hepatitis. The blood is then tested to be sure it is compatible with you in order to minimize the chance of a transfusion reaction. If you or a relative donates blood, this is often done in anticipation of surgery and is not appropriate for emergency situations. It takes many days to process the donated blood. RISKS AND COMPLICATIONS Although transfusion therapy is very safe and saves many lives, the main dangers of transfusion include:   Getting an infectious disease.  Developing a  transfusion reaction. This is an allergic reaction to something in the blood you were given. Every precaution is taken to prevent this. The decision to have a blood transfusion has been considered carefully by your caregiver before blood is given. Blood is not given unless the benefits outweigh the risks. AFTER THE TRANSFUSION  Right after receiving a blood transfusion, you will usually feel much better and more energetic. This is especially true if your red blood cells have gotten low (anemic). The transfusion raises the level of the red blood cells which carry oxygen, and this usually causes an energy increase.  The nurse administering the transfusion will monitor you carefully for complications. HOME CARE INSTRUCTIONS  No special instructions are needed after a transfusion. You may find your energy is better. Speak with your caregiver about any limitations on activity for underlying diseases you may have. SEEK MEDICAL CARE IF:   Your condition is not improving after your transfusion.  You develop redness or irritation at the intravenous (IV) site. SEEK IMMEDIATE MEDICAL CARE IF:  Any of the following symptoms occur over the next 12 hours:  Shaking chills.  You have a temperature by mouth above 102 F (38.9 C), not controlled by medicine.  Chest, back, or muscle pain.  People around you feel you are not acting correctly or are confused.  Shortness of breath or difficulty breathing.  Dizziness and fainting.  You get a rash or develop hives.  You have a decrease in urine output.  Your urine turns a dark color or changes to pink, red, or brown. Any of the following  symptoms occur over the next 10 days:  You have a temperature by mouth above 102 F (38.9 C), not controlled by medicine.  Shortness of breath.  Weakness after normal activity.  The white part of the eye turns yellow (jaundice).  You have a decrease in the amount of urine or are urinating less often.  Your  urine turns a dark color or changes to pink, red, or brown. Document Released: 06/09/2000 Document Revised: 09/04/2011 Document Reviewed: 01/27/2008 [facility] Patient Information 2015 [place], [place]. This information is not intended to replace advice given to you by your health care provider. Make sure you discuss any questions you have with your health care provider. [facility] Discharge Instructions for Patients Receiving Chemotherapy  Today you received the following chemotherapy agents Kyprolis To help prevent nausea and vomiting after your treatment, we encourage you to take your nausea medication as prescribed.   If you develop nausea and vomiting that is not controlled by your nausea medication, call the clinic.   BELOW ARE SYMPTOMS THAT SHOULD BE REPORTED IMMEDIATELY:  *FEVER GREATER THAN 100.5 F  *CHILLS WITH OR WITHOUT FEVER  NAUSEA AND VOMITING THAT IS NOT CONTROLLED WITH YOUR NAUSEA MEDICATION  *UNUSUAL SHORTNESS OF BREATH  *UNUSUAL BRUISING OR BLEEDING  TENDERNESS IN MOUTH AND THROAT WITH OR WITHOUT PRESENCE OF ULCERS  *URINARY PROBLEMS  *BOWEL PROBLEMS  UNUSUAL RASH Items with * indicate a potential emergency and should be followed up as soon as possible.  Feel free to call the clinic you have any questions or concerns. The clinic phone number is [phone].  Please show the [place] at check-in to the Emergency Department and triage nurse.

## 2015-02-09 NOTE — Telephone Encounter (Signed)
Per staff message and POF I have scheduled appts. Advised scheduler of appts and to move llab . JMW

## 2015-02-09 NOTE — Assessment & Plan Note (Signed)
She is susceptible to opportunistic infections. Her recent viral panel came back abnormal. She has seen a local infectious disease consultant who recommended her to go back to [facility] for transplant ID consultation. I discussed with the patient importance of this evaluation. It is likely that she may need a repeat bone marrow aspirate and biopsy as well.

## 2015-02-09 NOTE — Assessment & Plan Note (Signed)
On 01/19/15: HHV 6 IgG titre was high 1:320 and PCR (which was resulted on 7/28 was positive with >1000 copies)  On 01/20/15: she received IVIG  On 01/26/15: repeat HHV 6 PCR (drawn again by mistake) is negative  I am not certain whether the IVIG would have cleared her HHV 6 infection. I recommend transplant ID consultation and she agreed to proceed

## 2015-02-09 NOTE — Assessment & Plan Note (Signed)
This is likely due to recent treatment. The patient denies recent history of bleeding such as epistaxis, hematuria or hematochezia. She is asymptomatic from the low platelet count. I will observe for now.  Plan to hold treatment now until platelet count is greater than 50,000

## 2015-02-09 NOTE — Telephone Encounter (Signed)
Referral called to [facility] Dr Gwynneth Munson. Left message for her assistant to schedule appt. To call us back if has questions

## 2015-02-09 NOTE — Assessment & Plan Note (Signed)
We discussed some of the risks, benefits, and alternatives of blood transfusions. The patient is symptomatic from anemia and the hemoglobin level is critically low.  Some of the side-effects to be expected including risks of transfusion reactions, chills, infection, syndrome of volume overload and risk of hospitalization from various reasons and the patient is willing to proceed and went ahead to sign consent today.  

## 2015-02-10 ENCOUNTER — Encounter: Payer: Self-pay | Admitting: Hematology and Oncology

## 2015-02-10 ENCOUNTER — Telehealth: Payer: Self-pay | Admitting: *Deleted

## 2015-02-10 ENCOUNTER — Other Ambulatory Visit: Payer: Self-pay | Admitting: Hematology and Oncology

## 2015-02-10 ENCOUNTER — Ambulatory Visit (HOSPITAL_BASED_OUTPATIENT_CLINIC_OR_DEPARTMENT_OTHER): Payer: Medicare HMO

## 2015-02-10 ENCOUNTER — Telehealth: Payer: Self-pay | Admitting: Hematology and Oncology

## 2015-02-10 VITALS — BP 113/47 | HR 83 | Temp 98.6°F | Resp 18

## 2015-02-10 DIAGNOSIS — Z5112 Encounter for antineoplastic immunotherapy: Secondary | ICD-10-CM

## 2015-02-10 DIAGNOSIS — C9000 Multiple myeloma not having achieved remission: Secondary | ICD-10-CM

## 2015-02-10 DIAGNOSIS — D630 Anemia in neoplastic disease: Secondary | ICD-10-CM

## 2015-02-10 LAB — TYPE AND SCREEN
ABO/RH(D): O POS
Antibody Screen: NEGATIVE
Unit division: 0

## 2015-02-10 MED ORDER — SODIUM CHLORIDE 0.9 % IV SOLN: 250.0000 mL | Freq: Once | INTRAVENOUS | Status: DC

## 2015-02-10 MED ORDER — SODIUM CHLORIDE 0.9 % IV SOLN
Freq: Once | INTRAVENOUS | Status: AC
  Administered 2015-02-10: 14:00:00 via INTRAVENOUS

## 2015-02-10 MED ORDER — DEXTROSE 5 % IV SOLN
10.0000 mg/m2 | Freq: Once | INTRAVENOUS | Status: AC
  Administered 2015-02-10: 18 mg via INTRAVENOUS
  Filled 2015-02-10: qty 9

## 2015-02-10 MED ORDER — SODIUM CHLORIDE 0.9 % IV SOLN
Freq: Once | INTRAVENOUS | Status: AC
  Administered 2015-02-10: 14:00:00 via INTRAVENOUS
  Filled 2015-02-10: qty 4

## 2015-02-10 MED ORDER — HEPARIN SOD (PORK) LOCK FLUSH 100 UNIT/ML IV SOLN
500.0000 [IU] | Freq: Once | INTRAVENOUS | Status: AC | PRN
  Administered 2015-02-10: 500 [IU]
  Filled 2015-02-10: qty 5

## 2015-02-10 MED ORDER — HEPARIN SOD (PORK) LOCK FLUSH 100 UNIT/ML IV SOLN
500.0000 [IU] | Freq: Every day | INTRAVENOUS | Status: DC | PRN
  Filled 2015-02-10: qty 5

## 2015-02-10 MED ORDER — SODIUM CHLORIDE 0.9 % IJ SOLN
10.0000 mL | INTRAMUSCULAR | Status: DC | PRN
  Filled 2015-02-10: qty 10

## 2015-02-10 MED ORDER — SODIUM CHLORIDE 0.9 % IJ SOLN
10.0000 mL | INTRAMUSCULAR | Status: DC | PRN
  Administered 2015-02-10: 10 mL
  Filled 2015-02-10: qty 10

## 2015-02-10 NOTE — Telephone Encounter (Signed)
I have called and gave the patient new appt date and times

## 2015-02-10 NOTE — Telephone Encounter (Signed)
Appointments rescheduled per patient as she has an appointment in [place] the am of 8/23

## 2015-02-10 NOTE — Telephone Encounter (Signed)
Per staff message and POF I have scheduled appts. Advised scheduler of appts. JMW  

## 2015-02-10 NOTE — Patient Instructions (Signed)
[facility] Discharge Instructions for Patients Receiving Chemotherapy  Today you received the following chemotherapy agents: Kyprolis   To help prevent nausea and vomiting after your treatment, we encourage you to take your nausea medication as directed.    If you develop nausea and vomiting that is not controlled by your nausea medication, call the clinic.   BELOW ARE SYMPTOMS THAT SHOULD BE REPORTED IMMEDIATELY:  *FEVER GREATER THAN 100.5 F  *CHILLS WITH OR WITHOUT FEVER  NAUSEA AND VOMITING THAT IS NOT CONTROLLED WITH YOUR NAUSEA MEDICATION  *UNUSUAL SHORTNESS OF BREATH  *UNUSUAL BRUISING OR BLEEDING  TENDERNESS IN MOUTH AND THROAT WITH OR WITHOUT PRESENCE OF ULCERS  *URINARY PROBLEMS  *BOWEL PROBLEMS  UNUSUAL RASH Items with * indicate a potential emergency and should be followed up as soon as possible.  Feel free to call the clinic you have any questions or concerns. The clinic phone number is [phone].  Please show the CHEMO ALERT CARD at check-in to the Emergency Department and triage nurse.   

## 2015-02-10 NOTE — Telephone Encounter (Signed)
[facility] will contact patient to schedule appt

## 2015-02-15 LAB — OTHER SOLSTAS TEST

## 2015-02-16 ENCOUNTER — Other Ambulatory Visit: Payer: Medicare HMO

## 2015-02-16 ENCOUNTER — Ambulatory Visit: Payer: Medicare HMO

## 2015-02-17 ENCOUNTER — Ambulatory Visit (HOSPITAL_BASED_OUTPATIENT_CLINIC_OR_DEPARTMENT_OTHER): Payer: Medicare HMO

## 2015-02-17 ENCOUNTER — Other Ambulatory Visit (HOSPITAL_BASED_OUTPATIENT_CLINIC_OR_DEPARTMENT_OTHER): Payer: Medicare HMO

## 2015-02-17 VITALS — BP 133/68 | HR 89 | Temp 98.9°F | Resp 18

## 2015-02-17 DIAGNOSIS — Z5112 Encounter for antineoplastic immunotherapy: Secondary | ICD-10-CM | POA: Diagnosis not present

## 2015-02-17 DIAGNOSIS — C9000 Multiple myeloma not having achieved remission: Secondary | ICD-10-CM

## 2015-02-17 LAB — CBC WITH DIFFERENTIAL/PLATELET
BASO%: 0.6 % (ref 0.0–2.0)
Basophils Absolute: 0 10*3/uL (ref 0.0–0.1)
EOS%: 0.9 % (ref 0.0–7.0)
Eosinophils Absolute: 0 10*3/uL (ref 0.0–0.5)
HCT: 26.2 % — ABNORMAL LOW (ref 34.8–46.6)
HGB: 8.9 g/dL — ABNORMAL LOW (ref 11.6–15.9)
LYMPH%: 38.5 % (ref 14.0–49.7)
MCH: 31.7 pg (ref 25.1–34.0)
MCHC: 34 g/dL (ref 31.5–36.0)
MCV: 93.1 fL (ref 79.5–101.0)
MONO#: 0.1 10*3/uL (ref 0.1–0.9)
MONO%: 3.8 % (ref 0.0–14.0)
NEUT#: 1.8 10*3/uL (ref 1.5–6.5)
NEUT%: 56.2 % (ref 38.4–76.8)
Platelets: 78 10*3/uL — ABNORMAL LOW (ref 145–400)
RBC: 2.81 10*6/uL — ABNORMAL LOW (ref 3.70–5.45)
RDW: 17.5 % — ABNORMAL HIGH (ref 11.2–14.5)
WBC: 3.2 10*3/uL — ABNORMAL LOW (ref 3.9–10.3)
lymph#: 1.2 10*3/uL (ref 0.9–3.3)

## 2015-02-17 LAB — COMPREHENSIVE METABOLIC PANEL (CC13)
ALT: 10 U/L (ref 0–55)
AST: 14 U/L (ref 5–34)
Albumin: 4.1 g/dL (ref 3.5–5.0)
Alkaline Phosphatase: 49 U/L (ref 40–150)
Anion Gap: 8 mEq/L (ref 3–11)
BUN: 12.5 mg/dL (ref 7.0–26.0)
CO2: 24 mEq/L (ref 22–29)
Calcium: 8.6 mg/dL (ref 8.4–10.4)
Chloride: 107 mEq/L (ref 98–109)
Creatinine: 0.8 mg/dL (ref 0.6–1.1)
EGFR: 71 mL/min/{1.73_m2} — ABNORMAL LOW (ref >90)
Glucose: 153 mg/dl — ABNORMAL HIGH (ref 70–140)
Potassium: 4.1 mEq/L (ref 3.5–5.1)
Sodium: 139 mEq/L (ref 136–145)
Total Bilirubin: 0.45 mg/dL (ref 0.20–1.20)
Total Protein: 6.8 g/dL (ref 6.4–8.3)

## 2015-02-17 MED ORDER — HEPARIN SOD (PORK) LOCK FLUSH 100 UNIT/ML IV SOLN
500.0000 [IU] | Freq: Once | INTRAVENOUS | Status: AC | PRN
  Administered 2015-02-17: 500 [IU]
  Filled 2015-02-17: qty 5

## 2015-02-17 MED ORDER — DEXTROSE 5 % IV SOLN
10.0000 mg/m2 | Freq: Once | INTRAVENOUS | Status: AC
  Administered 2015-02-17: 18 mg via INTRAVENOUS
  Filled 2015-02-17: qty 9

## 2015-02-17 MED ORDER — SODIUM CHLORIDE 0.9 % IJ SOLN
10.0000 mL | INTRAMUSCULAR | Status: DC | PRN
  Administered 2015-02-17: 10 mL
  Filled 2015-02-17: qty 10

## 2015-02-17 MED ORDER — SODIUM CHLORIDE 0.9 % IV SOLN
Freq: Once | INTRAVENOUS | Status: AC
  Administered 2015-02-17: 14:00:00 via INTRAVENOUS
  Filled 2015-02-17: qty 4

## 2015-02-17 MED ORDER — SODIUM CHLORIDE 0.9 % IV SOLN: Freq: Once | INTRAVENOUS | Status: DC

## 2015-02-17 MED ORDER — SODIUM CHLORIDE 0.9 % IV SOLN
Freq: Once | INTRAVENOUS | Status: AC
  Administered 2015-02-17: 14:00:00 via INTRAVENOUS

## 2015-02-17 NOTE — Patient Instructions (Signed)
[facility] Discharge Instructions for Patients Receiving Chemotherapy  Today you received the following chemotherapy agents Kyprolis  To help prevent nausea and vomiting after your treatment, we encourage you to take your nausea medication    If you develop nausea and vomiting that is not controlled by your nausea medication, call the clinic.   BELOW ARE SYMPTOMS THAT SHOULD BE REPORTED IMMEDIATELY:  *FEVER GREATER THAN 100.5 F  *CHILLS WITH OR WITHOUT FEVER  NAUSEA AND VOMITING THAT IS NOT CONTROLLED WITH YOUR NAUSEA MEDICATION  *UNUSUAL SHORTNESS OF BREATH  *UNUSUAL BRUISING OR BLEEDING  TENDERNESS IN MOUTH AND THROAT WITH OR WITHOUT PRESENCE OF ULCERS  *URINARY PROBLEMS  *BOWEL PROBLEMS  UNUSUAL RASH Items with * indicate a potential emergency and should be followed up as soon as possible.  Feel free to call the clinic you have any questions or concerns. The clinic phone number is [phone].  Please show the CHEMO ALERT CARD at check-in to the Emergency Department and triage nurse.   

## 2015-02-18 ENCOUNTER — Ambulatory Visit (HOSPITAL_BASED_OUTPATIENT_CLINIC_OR_DEPARTMENT_OTHER): Payer: Medicare HMO

## 2015-02-18 VITALS — BP 145/65 | HR 78 | Temp 98.9°F | Resp 20

## 2015-02-18 DIAGNOSIS — C9000 Multiple myeloma not having achieved remission: Secondary | ICD-10-CM | POA: Diagnosis not present

## 2015-02-18 DIAGNOSIS — Z5112 Encounter for antineoplastic immunotherapy: Secondary | ICD-10-CM

## 2015-02-18 MED ORDER — HEPARIN SOD (PORK) LOCK FLUSH 100 UNIT/ML IV SOLN
500.0000 [IU] | Freq: Once | INTRAVENOUS | Status: AC | PRN
  Administered 2015-02-18: 500 [IU]
  Filled 2015-02-18: qty 5

## 2015-02-18 MED ORDER — DEXTROSE 5 % IV SOLN
10.0000 mg/m2 | Freq: Once | INTRAVENOUS | Status: AC
  Administered 2015-02-18: 18 mg via INTRAVENOUS
  Filled 2015-02-18: qty 9

## 2015-02-18 MED ORDER — SODIUM CHLORIDE 0.9 % IJ SOLN
10.0000 mL | INTRAMUSCULAR | Status: DC | PRN
  Administered 2015-02-18: 10 mL
  Filled 2015-02-18: qty 10

## 2015-02-18 MED ORDER — SODIUM CHLORIDE 0.9 % IV SOLN
Freq: Once | INTRAVENOUS | Status: AC
  Administered 2015-02-18: 09:00:00 via INTRAVENOUS
  Filled 2015-02-18: qty 4

## 2015-02-18 MED ORDER — SODIUM CHLORIDE 0.9 % IV SOLN
Freq: Once | INTRAVENOUS | Status: AC
  Administered 2015-02-18: 09:00:00 via INTRAVENOUS

## 2015-02-18 NOTE — Patient Instructions (Signed)
[facility] Discharge Instructions for Patients Receiving Chemotherapy  Today you received the following chemotherapy agents Kyprolis  To help prevent nausea and vomiting after your treatment, we encourage you to take your nausea medication    If you develop nausea and vomiting that is not controlled by your nausea medication, call the clinic.   BELOW ARE SYMPTOMS THAT SHOULD BE REPORTED IMMEDIATELY:  *FEVER GREATER THAN 100.5 F  *CHILLS WITH OR WITHOUT FEVER  NAUSEA AND VOMITING THAT IS NOT CONTROLLED WITH YOUR NAUSEA MEDICATION  *UNUSUAL SHORTNESS OF BREATH  *UNUSUAL BRUISING OR BLEEDING  TENDERNESS IN MOUTH AND THROAT WITH OR WITHOUT PRESENCE OF ULCERS  *URINARY PROBLEMS  *BOWEL PROBLEMS  UNUSUAL RASH Items with * indicate a potential emergency and should be followed up as soon as possible.  Feel free to call the clinic you have any questions or concerns. The clinic phone number is [phone].  Please show the CHEMO ALERT CARD at check-in to the Emergency Department and triage nurse.   

## 2015-02-19 ENCOUNTER — Ambulatory Visit (HOSPITAL_BASED_OUTPATIENT_CLINIC_OR_DEPARTMENT_OTHER): Payer: Medicare HMO

## 2015-02-19 VITALS — BP 117/79 | HR 73 | Temp 98.2°F | Resp 18

## 2015-02-19 DIAGNOSIS — D72819 Decreased white blood cell count, unspecified: Secondary | ICD-10-CM | POA: Diagnosis not present

## 2015-02-19 DIAGNOSIS — C9000 Multiple myeloma not having achieved remission: Secondary | ICD-10-CM

## 2015-02-19 DIAGNOSIS — D839 Common variable immunodeficiency, unspecified: Secondary | ICD-10-CM

## 2015-02-19 MED ORDER — DIPHENHYDRAMINE HCL 25 MG PO CAPS
ORAL_CAPSULE | ORAL | Status: AC
  Filled 2015-02-19: qty 1

## 2015-02-19 MED ORDER — IMMUNE GLOBULIN (HUMAN) 20 GM/200ML IV SOLN
0.5000 g/kg | INTRAVENOUS | Status: DC
  Administered 2015-02-19: 35 g via INTRAVENOUS
  Filled 2015-02-19: qty 350

## 2015-02-19 MED ORDER — DIPHENHYDRAMINE HCL 25 MG PO TABS
25.0000 mg | ORAL_TABLET | Freq: Four times a day (QID) | ORAL | Status: DC | PRN
  Administered 2015-02-19: 25 mg via ORAL
  Filled 2015-02-19: qty 1

## 2015-02-19 MED ORDER — ACETAMINOPHEN 325 MG PO TABS
ORAL_TABLET | ORAL | Status: AC
Start: 2015-02-19 — End: 2015-02-19
  Filled 2015-02-19: qty 2

## 2015-02-19 MED ORDER — ACETAMINOPHEN 325 MG PO TABS
650.0000 mg | ORAL_TABLET | Freq: Four times a day (QID) | ORAL | Status: DC | PRN
  Administered 2015-02-19: 650 mg via ORAL

## 2015-02-19 NOTE — Patient Instructions (Signed)

## 2015-02-23 ENCOUNTER — Ambulatory Visit (HOSPITAL_BASED_OUTPATIENT_CLINIC_OR_DEPARTMENT_OTHER): Payer: Medicare HMO

## 2015-02-23 ENCOUNTER — Other Ambulatory Visit: Payer: Self-pay | Admitting: Hematology and Oncology

## 2015-02-23 ENCOUNTER — Other Ambulatory Visit (HOSPITAL_BASED_OUTPATIENT_CLINIC_OR_DEPARTMENT_OTHER): Payer: Medicare HMO

## 2015-02-23 VITALS — BP 120/59 | HR 72 | Temp 99.2°F | Resp 16

## 2015-02-23 DIAGNOSIS — C9000 Multiple myeloma not having achieved remission: Secondary | ICD-10-CM

## 2015-02-23 DIAGNOSIS — Z5112 Encounter for antineoplastic immunotherapy: Secondary | ICD-10-CM | POA: Diagnosis not present

## 2015-02-23 DIAGNOSIS — D630 Anemia in neoplastic disease: Secondary | ICD-10-CM

## 2015-02-23 LAB — CBC WITH DIFFERENTIAL/PLATELET
BASO%: 0.4 % (ref 0.0–2.0)
Basophils Absolute: 0 10*3/uL (ref 0.0–0.1)
EOS%: 0 % (ref 0.0–7.0)
Eosinophils Absolute: 0 10*3/uL (ref 0.0–0.5)
HCT: 23.2 % — ABNORMAL LOW (ref 34.8–46.6)
HGB: 7.8 g/dL — ABNORMAL LOW (ref 11.6–15.9)
LYMPH%: 42.6 % (ref 14.0–49.7)
MCH: 31 pg (ref 25.1–34.0)
MCHC: 33.6 g/dL (ref 31.5–36.0)
MCV: 92.1 fL (ref 79.5–101.0)
MONO#: 0.2 10*3/uL (ref 0.1–0.9)
MONO%: 7.4 % (ref 0.0–14.0)
NEUT#: 1.2 10*3/uL — ABNORMAL LOW (ref 1.5–6.5)
NEUT%: 49.6 % (ref 38.4–76.8)
Platelets: 59 10*3/uL — ABNORMAL LOW (ref 145–400)
RBC: 2.52 10*6/uL — ABNORMAL LOW (ref 3.70–5.45)
RDW: 17.5 % — ABNORMAL HIGH (ref 11.2–14.5)
WBC: 2.4 10*3/uL — ABNORMAL LOW (ref 3.9–10.3)
lymph#: 1 10*3/uL (ref 0.9–3.3)
nRBC: 1 % — ABNORMAL HIGH (ref 0–0)

## 2015-02-23 LAB — COMPREHENSIVE METABOLIC PANEL (CC13)
ALT: 7 U/L (ref 0–55)
AST: 13 U/L (ref 5–34)
Albumin: 3.7 g/dL (ref 3.5–5.0)
Alkaline Phosphatase: 37 U/L — ABNORMAL LOW (ref 40–150)
Anion Gap: 7 mEq/L (ref 3–11)
BUN: 12.2 mg/dL (ref 7.0–26.0)
CO2: 26 mEq/L (ref 22–29)
Calcium: 9.1 mg/dL (ref 8.4–10.4)
Chloride: 108 mEq/L (ref 98–109)
Creatinine: 0.8 mg/dL (ref 0.6–1.1)
EGFR: 79 mL/min/{1.73_m2} — ABNORMAL LOW (ref >90)
Glucose: 103 mg/dl (ref 70–140)
Potassium: 4.4 mEq/L (ref 3.5–5.1)
Sodium: 142 mEq/L (ref 136–145)
Total Bilirubin: 0.46 mg/dL (ref 0.20–1.20)
Total Protein: 6.9 g/dL (ref 6.4–8.3)

## 2015-02-23 LAB — PREPARE RBC (CROSSMATCH)

## 2015-02-23 LAB — HOLD TUBE, BLOOD BANK

## 2015-02-23 MED ORDER — DIPHENHYDRAMINE HCL 25 MG PO CAPS
ORAL_CAPSULE | ORAL | Status: AC
  Filled 2015-02-23: qty 1

## 2015-02-23 MED ORDER — SODIUM CHLORIDE 0.9 % IV SOLN
Freq: Once | INTRAVENOUS | Status: AC
  Administered 2015-02-23: 10:00:00 via INTRAVENOUS

## 2015-02-23 MED ORDER — ACETAMINOPHEN 325 MG PO TABS
ORAL_TABLET | ORAL | Status: AC
  Filled 2015-02-23: qty 2

## 2015-02-23 MED ORDER — SODIUM CHLORIDE 0.9 % IV SOLN
Freq: Once | INTRAVENOUS | Status: AC
  Administered 2015-02-23: 10:00:00 via INTRAVENOUS
  Filled 2015-02-23: qty 4

## 2015-02-23 MED ORDER — SODIUM CHLORIDE 0.9 % IV SOLN
250.0000 mL | Freq: Once | INTRAVENOUS | Status: AC
  Administered 2015-02-23: 250 mL via INTRAVENOUS

## 2015-02-23 MED ORDER — DEXTROSE 5 % IV SOLN
10.0000 mg/m2 | Freq: Once | INTRAVENOUS | Status: AC
  Administered 2015-02-23: 18 mg via INTRAVENOUS
  Filled 2015-02-23: qty 9

## 2015-02-23 MED ORDER — HEPARIN SOD (PORK) LOCK FLUSH 100 UNIT/ML IV SOLN
500.0000 [IU] | Freq: Once | INTRAVENOUS | Status: AC | PRN
  Administered 2015-02-23: 500 [IU]
  Filled 2015-02-23: qty 5

## 2015-02-23 MED ORDER — SODIUM CHLORIDE 0.9 % IJ SOLN
10.0000 mL | INTRAMUSCULAR | Status: DC | PRN
  Administered 2015-02-23: 10 mL
  Filled 2015-02-23: qty 10

## 2015-02-23 MED ORDER — ACETAMINOPHEN 325 MG PO TABS
650.0000 mg | ORAL_TABLET | Freq: Once | ORAL | Status: AC
  Administered 2015-02-23: 650 mg via ORAL

## 2015-02-23 MED ORDER — DIPHENHYDRAMINE HCL 25 MG PO CAPS
25.0000 mg | ORAL_CAPSULE | Freq: Once | ORAL | Status: AC
  Administered 2015-02-23: 25 mg via ORAL

## 2015-02-23 NOTE — Patient Instructions (Addendum)
[place] Discharge Instructions for Patients Receiving Chemotherapy  Today you received the following chemotherapy agents kyprolis  To help prevent nausea and vomiting after your treatment, we encourage you to take your nausea medication   If you develop nausea and vomiting that is not controlled by your nausea medication, call the clinic.   BELOW ARE SYMPTOMS THAT SHOULD BE REPORTED IMMEDIATELY:  *FEVER GREATER THAN 100.5 F  *CHILLS WITH OR WITHOUT FEVER  NAUSEA AND VOMITING THAT IS NOT CONTROLLED WITH YOUR NAUSEA MEDICATION  *UNUSUAL SHORTNESS OF BREATH  *UNUSUAL BRUISING OR BLEEDING  TENDERNESS IN MOUTH AND THROAT WITH OR WITHOUT PRESENCE OF ULCERS  *URINARY PROBLEMS  *BOWEL PROBLEMS  UNUSUAL RASH Items with * indicate a potential emergency and should be followed up as soon as possible.  Feel free to call the clinic you have any questions or concerns. The clinic phone number is [phone].  Please show the [facility] at check-in to the Emergency Department and triage nurse.  Blood Transfusion Information WHAT IS A BLOOD TRANSFUSION? A transfusion is the replacement of blood or some of its parts. Blood is made up of multiple cells which provide different functions.  Red blood cells carry oxygen and are used for blood loss replacement.  White blood cells fight against infection.  Platelets control bleeding.  Plasma helps clot blood.  Other blood products are available for specialized needs, such as hemophilia or other clotting disorders. BEFORE THE TRANSFUSION  Who gives blood for transfusions?   You may be able to donate blood to be used at a later date on yourself (autologous donation).  Relatives can be asked to donate blood. This is generally not any safer than if you have received blood from a stranger. The same precautions are taken to ensure safety when a relative's blood is donated.  Healthy volunteers who are fully evaluated  to make sure their blood is safe. This is blood bank blood. Transfusion therapy is the safest it has ever been in the practice of medicine. Before blood is taken from a donor, a complete history is taken to make sure that person has no history of diseases nor engages in risky social behavior (examples are intravenous drug use or sexual activity with multiple partners). The donor's travel history is screened to minimize risk of transmitting infections, such as malaria. The donated blood is tested for signs of infectious diseases, such as HIV and hepatitis. The blood is then tested to be sure it is compatible with you in order to minimize the chance of a transfusion reaction. If you or a relative donates blood, this is often done in anticipation of surgery and is not appropriate for emergency situations. It takes many days to process the donated blood. RISKS AND COMPLICATIONS Although transfusion therapy is very safe and saves many lives, the main dangers of transfusion include:   Getting an infectious disease.  Developing a transfusion reaction. This is an allergic reaction to something in the blood you were given. Every precaution is taken to prevent this. The decision to have a blood transfusion has been considered carefully by your caregiver before blood is given. Blood is not given unless the benefits outweigh the risks. AFTER THE TRANSFUSION  Right after receiving a blood transfusion, you will usually feel much better and more energetic. This is especially true if your red blood cells have gotten low (anemic). The transfusion raises the level of the red blood cells which carry oxygen, and this usually causes  an energy increase.  The nurse administering the transfusion will monitor you carefully for complications. HOME CARE INSTRUCTIONS  No special instructions are needed after a transfusion. You may find your energy is better. Speak with your caregiver about any limitations on activity for  underlying diseases you may have. SEEK MEDICAL CARE IF:   Your condition is not improving after your transfusion.  You develop redness or irritation at the intravenous (IV) site. SEEK IMMEDIATE MEDICAL CARE IF:  Any of the following symptoms occur over the next 12 hours:  Shaking chills.  You have a temperature by mouth above 102 F (38.9 C), not controlled by medicine.  Chest, back, or muscle pain.  People around you feel you are not acting correctly or are confused.  Shortness of breath or difficulty breathing.  Dizziness and fainting.  You get a rash or develop hives.  You have a decrease in urine output.  Your urine turns a dark color or changes to pink, red, or brown. Any of the following symptoms occur over the next 10 days:  You have a temperature by mouth above 102 F (38.9 C), not controlled by medicine.  Shortness of breath.  Weakness after normal activity.  The white part of the eye turns yellow (jaundice).  You have a decrease in the amount of urine or are urinating less often.  Your urine turns a dark color or changes to pink, red, or brown. Document Released: 06/09/2000 Document Revised: 09/04/2011 Document Reviewed: 01/27/2008 [facility] Patient Information 2015 [place], [place]. This information is not intended to replace advice given to you by your health care provider. Make sure you discuss any questions you have with your health care provider.

## 2015-02-23 NOTE — Progress Notes (Signed)
Ok to treat today and tomorrow w/ low ANC, low Platelets and low Hgb.  Transfuse one unit of blood today and one unit tomorrow w/ treatment.  Per Dr. Alvy Bimler.

## 2015-02-24 ENCOUNTER — Ambulatory Visit (HOSPITAL_BASED_OUTPATIENT_CLINIC_OR_DEPARTMENT_OTHER): Payer: Medicare HMO

## 2015-02-24 VITALS — BP 134/61 | HR 71 | Temp 98.7°F | Resp 18

## 2015-02-24 DIAGNOSIS — Z5112 Encounter for antineoplastic immunotherapy: Secondary | ICD-10-CM

## 2015-02-24 DIAGNOSIS — C9000 Multiple myeloma not having achieved remission: Secondary | ICD-10-CM | POA: Diagnosis not present

## 2015-02-24 DIAGNOSIS — D630 Anemia in neoplastic disease: Secondary | ICD-10-CM | POA: Diagnosis not present

## 2015-02-24 MED ORDER — DEXTROSE 5 % IV SOLN
10.0000 mg/m2 | Freq: Once | INTRAVENOUS | Status: AC
  Administered 2015-02-24: 18 mg via INTRAVENOUS
  Filled 2015-02-24: qty 9

## 2015-02-24 MED ORDER — ACETAMINOPHEN 325 MG PO TABS
ORAL_TABLET | ORAL | Status: AC
  Filled 2015-02-24: qty 2

## 2015-02-24 MED ORDER — SODIUM CHLORIDE 0.9 % IJ SOLN
10.0000 mL | INTRAMUSCULAR | Status: DC | PRN
  Administered 2015-02-24: 10 mL
  Filled 2015-02-24: qty 10

## 2015-02-24 MED ORDER — SODIUM CHLORIDE 0.9 % IJ SOLN
10.0000 mL | INTRAMUSCULAR | Status: DC | PRN
Start: 2015-02-24 — End: 2015-02-24
  Filled 2015-02-24: qty 10

## 2015-02-24 MED ORDER — DIPHENHYDRAMINE HCL 25 MG PO CAPS
ORAL_CAPSULE | ORAL | Status: AC
  Filled 2015-02-24: qty 1

## 2015-02-24 MED ORDER — SODIUM CHLORIDE 0.9 % IJ SOLN
3.0000 mL | INTRAMUSCULAR | Status: DC | PRN
  Filled 2015-02-24: qty 10

## 2015-02-24 MED ORDER — HEPARIN SOD (PORK) LOCK FLUSH 100 UNIT/ML IV SOLN
500.0000 [IU] | Freq: Once | INTRAVENOUS | Status: DC | PRN
  Filled 2015-02-24: qty 5

## 2015-02-24 MED ORDER — DIPHENHYDRAMINE HCL 25 MG PO CAPS
25.0000 mg | ORAL_CAPSULE | Freq: Once | ORAL | Status: AC
  Administered 2015-02-24: 25 mg via ORAL

## 2015-02-24 MED ORDER — SODIUM CHLORIDE 0.9 % IV SOLN
Freq: Once | INTRAVENOUS | Status: AC
  Administered 2015-02-24: 09:00:00 via INTRAVENOUS

## 2015-02-24 MED ORDER — ACETAMINOPHEN 325 MG PO TABS
650.0000 mg | ORAL_TABLET | Freq: Once | ORAL | Status: AC
  Administered 2015-02-24: 650 mg via ORAL

## 2015-02-24 MED ORDER — HEPARIN SOD (PORK) LOCK FLUSH 100 UNIT/ML IV SOLN
500.0000 [IU] | Freq: Every day | INTRAVENOUS | Status: AC | PRN
  Administered 2015-02-24: 500 [IU]
  Filled 2015-02-24: qty 5

## 2015-02-24 MED ORDER — HEPARIN SOD (PORK) LOCK FLUSH 100 UNIT/ML IV SOLN
250.0000 [IU] | INTRAVENOUS | Status: DC | PRN
  Filled 2015-02-24: qty 5

## 2015-02-24 MED ORDER — SODIUM CHLORIDE 0.9 % IV SOLN
Freq: Once | INTRAVENOUS | Status: AC
  Administered 2015-02-24: 09:00:00 via INTRAVENOUS
  Filled 2015-02-24: qty 4

## 2015-02-24 NOTE — Patient Instructions (Signed)
[place] Discharge Instructions for Patients Receiving Chemotherapy  Today you received the following chemotherapy agents kyprolis   To help prevent nausea and vomiting after your treatment, we encourage you to take your nausea medication as directed   If you develop nausea and vomiting that is not controlled by your nausea medication, call the clinic.   BELOW ARE SYMPTOMS THAT SHOULD BE REPORTED IMMEDIATELY:  *FEVER GREATER THAN 100.5 F  *CHILLS WITH OR WITHOUT FEVER  NAUSEA AND VOMITING THAT IS NOT CONTROLLED WITH YOUR NAUSEA MEDICATION  *UNUSUAL SHORTNESS OF BREATH  *UNUSUAL BRUISING OR BLEEDING  TENDERNESS IN MOUTH AND THROAT WITH OR WITHOUT PRESENCE OF ULCERS  *URINARY PROBLEMS  *BOWEL PROBLEMS  UNUSUAL RASH Items with * indicate a potential emergency and should be followed up as soon as possible.  Feel free to call the clinic you have any questions or concerns. The clinic phone number is [phone].  Blood Transfusion  A blood transfusion replaces your blood or some of its parts. Blood is replaced when you have lost blood because of surgery, an accident, or for severe blood conditions like anemia. You can donate blood to be used on yourself if you have a planned surgery. If you lose blood during that surgery, your own blood can be given back to you. Any blood given to you is checked to make sure it matches your blood type. Your temperature, blood pressure, and heart rate (vital signs) will be checked often.  GET HELP RIGHT AWAY IF:   You feel sick to your stomach (nauseous) or throw up (vomit).  You have watery poop (diarrhea).  You have shortness of breath or trouble breathing.  You have blood in your pee (urine) or have dark colored pee.  You have chest pain or tightness.  Your eyes or skin turn yellow (jaundice).  You have a temperature by mouth above 102 F (38.9 C), not controlled by medicine.  You start to shake and have  chills.  You develop a a red rash (hives) or feel itchy.  You develop lightheadedness or feel confused.  You develop back, joint, or muscle pain.  You do not feel hungry (lost appetite).  You feel tired, restless, or nervous.  You develop belly (abdominal) cramps. Document Released: 09/08/2008 Document Revised: 09/04/2011 Document Reviewed: 09/08/2008 [facility] Patient Information 2015 [place], [place]. This information is not intended to replace advice given to you by your health care provider. Make sure you discuss any questions you have with your health care provider.

## 2015-02-25 ENCOUNTER — Encounter: Payer: Self-pay | Admitting: Hematology and Oncology

## 2015-02-25 ENCOUNTER — Other Ambulatory Visit: Payer: Self-pay | Admitting: *Deleted

## 2015-02-25 LAB — TYPE AND SCREEN
ABO/RH(D): O POS
Antibody Screen: NEGATIVE
Unit division: 0
Unit division: 0

## 2015-02-25 NOTE — Telephone Encounter (Signed)
Notified [facility] of Revlimid continues to be held.  Will send new Rx when pt resumes.

## 2015-02-25 NOTE — Addendum Note (Signed)
Addended by: Adalberto Cole on: 02/25/2015 04:03 PM   Modules accepted: Orders

## 2015-02-26 ENCOUNTER — Ambulatory Visit: Payer: Medicare HMO

## 2015-02-26 ENCOUNTER — Other Ambulatory Visit: Payer: Self-pay

## 2015-03-01 ENCOUNTER — Encounter: Payer: Self-pay | Admitting: Hematology and Oncology

## 2015-03-02 ENCOUNTER — Other Ambulatory Visit: Payer: Medicare HMO

## 2015-03-05 ENCOUNTER — Other Ambulatory Visit (HOSPITAL_BASED_OUTPATIENT_CLINIC_OR_DEPARTMENT_OTHER): Payer: Medicare HMO

## 2015-03-05 ENCOUNTER — Other Ambulatory Visit: Payer: Self-pay | Admitting: Hematology and Oncology

## 2015-03-05 ENCOUNTER — Ambulatory Visit (HOSPITAL_BASED_OUTPATIENT_CLINIC_OR_DEPARTMENT_OTHER): Payer: Medicare HMO

## 2015-03-05 VITALS — BP 151/67 | HR 72 | Temp 98.8°F

## 2015-03-05 DIAGNOSIS — C9000 Multiple myeloma not having achieved remission: Secondary | ICD-10-CM

## 2015-03-05 LAB — COMPREHENSIVE METABOLIC PANEL (CC13)
ALT: 7 U/L (ref 0–55)
AST: 19 U/L (ref 5–34)
Albumin: 3.8 g/dL (ref 3.5–5.0)
Alkaline Phosphatase: 49 U/L (ref 40–150)
Anion Gap: 7 mEq/L (ref 3–11)
BUN: 14.5 mg/dL (ref 7.0–26.0)
CO2: 25 mEq/L (ref 22–29)
Calcium: 9.1 mg/dL (ref 8.4–10.4)
Chloride: 109 mEq/L (ref 98–109)
Creatinine: 0.8 mg/dL (ref 0.6–1.1)
EGFR: 78 mL/min/{1.73_m2} — ABNORMAL LOW (ref >90)
Glucose: 90 mg/dl (ref 70–140)
Potassium: 4.2 mEq/L (ref 3.5–5.1)
Sodium: 141 mEq/L (ref 136–145)
Total Bilirubin: 0.54 mg/dL (ref 0.20–1.20)
Total Protein: 6.8 g/dL (ref 6.4–8.3)

## 2015-03-05 LAB — CBC WITH DIFFERENTIAL/PLATELET
BASO%: 0.7 % (ref 0.0–2.0)
Basophils Absolute: 0 10*3/uL (ref 0.0–0.1)
EOS%: 0.9 % (ref 0.0–7.0)
Eosinophils Absolute: 0 10*3/uL (ref 0.0–0.5)
HCT: 30.7 % — ABNORMAL LOW (ref 34.8–46.6)
HGB: 10.1 g/dL — ABNORMAL LOW (ref 11.6–15.9)
LYMPH%: 36.4 % (ref 14.0–49.7)
MCH: 30 pg (ref 25.1–34.0)
MCHC: 32.9 g/dL (ref 31.5–36.0)
MCV: 91.1 fL (ref 79.5–101.0)
MONO#: 0.2 10*3/uL (ref 0.1–0.9)
MONO%: 5.9 % (ref 0.0–14.0)
NEUT#: 1.8 10*3/uL (ref 1.5–6.5)
NEUT%: 56.1 % (ref 38.4–76.8)
Platelets: 93 10*3/uL — ABNORMAL LOW (ref 145–400)
RBC: 3.37 10*6/uL — ABNORMAL LOW (ref 3.70–5.45)
RDW: 16.7 % — ABNORMAL HIGH (ref 11.2–14.5)
WBC: 3.2 10*3/uL — ABNORMAL LOW (ref 3.9–10.3)
lymph#: 1.2 10*3/uL (ref 0.9–3.3)

## 2015-03-05 MED ORDER — HEPARIN SOD (PORK) LOCK FLUSH 100 UNIT/ML IV SOLN
500.0000 [IU] | Freq: Once | INTRAVENOUS | Status: AC | PRN
  Administered 2015-03-05: 500 [IU]
  Filled 2015-03-05: qty 5

## 2015-03-05 MED ORDER — SODIUM CHLORIDE 0.9 % IV SOLN
Freq: Once | INTRAVENOUS | Status: AC
  Administered 2015-03-05: 10:00:00 via INTRAVENOUS

## 2015-03-05 MED ORDER — ZOLEDRONIC ACID 4 MG/100ML IV SOLN
4.0000 mg | Freq: Once | INTRAVENOUS | Status: AC
Start: 2015-03-05 — End: 2015-03-05
  Administered 2015-03-05: 4 mg via INTRAVENOUS
  Filled 2015-03-05: qty 100

## 2015-03-05 MED ORDER — SODIUM CHLORIDE 0.9 % IJ SOLN
10.0000 mL | INTRAMUSCULAR | Status: DC | PRN
  Administered 2015-03-05: 10 mL
  Filled 2015-03-05: qty 10

## 2015-03-05 NOTE — Patient Instructions (Signed)
Zoledronic Acid injection (Hypercalcemia, Oncology) (Zometa) What is this medicine? ZOLEDRONIC ACID (ZOE le dron ik AS id) lowers the amount of calcium loss from bone. It is used to treat too much calcium in your blood from cancer. It is also used to prevent complications of cancer that has spread to the bone. This medicine may be used for other purposes; ask your health care provider or pharmacist if you have questions. COMMON BRAND NAME(S): Zometa What should I tell my health care provider before I take this medicine? They need to know if you have any of these conditions: -aspirin-sensitive asthma -cancer, especially if you are receiving medicines used to treat cancer -dental disease or wear dentures -infection -kidney disease -receiving corticosteroids like dexamethasone or prednisone -an unusual or allergic reaction to zoledronic acid, other medicines, foods, dyes, or preservatives -pregnant or trying to get pregnant -breast-feeding How should I use this medicine? This medicine is for infusion into a vein. It is given by a health care professional in a hospital or clinic setting. Talk to your pediatrician regarding the use of this medicine in children. Special care may be needed. Overdosage: If you think you have taken too much of this medicine contact a poison control center or emergency room at once. NOTE: This medicine is only for you. Do not share this medicine with others. What if I miss a dose? It is important not to miss your dose. Call your doctor or health care professional if you are unable to keep an appointment. What may interact with this medicine? -certain antibiotics given by injection -NSAIDs, medicines for pain and inflammation, like ibuprofen or naproxen -some diuretics like bumetanide, furosemide -teriparatide -thalidomide This list may not describe all possible interactions. Give your health care provider a list of all the medicines, herbs, non-prescription drugs,  or dietary supplements you use. Also tell them if you smoke, drink alcohol, or use illegal drugs. Some items may interact with your medicine. What should I watch for while using this medicine? Visit your doctor or health care professional for regular checkups. It may be some time before you see the benefit from this medicine. Do not stop taking your medicine unless your doctor tells you to. Your doctor may order blood tests or other tests to see how you are doing. Women should inform their doctor if they wish to become pregnant or think they might be pregnant. There is a potential for serious side effects to an unborn child. Talk to your health care professional or pharmacist for more information. You should make sure that you get enough calcium and vitamin D while you are taking this medicine. Discuss the foods you eat and the vitamins you take with your health care professional. Some people who take this medicine have severe bone, joint, and/or muscle pain. This medicine may also increase your risk for jaw problems or a broken thigh bone. Tell your doctor right away if you have severe pain in your jaw, bones, joints, or muscles. Tell your doctor if you have any pain that does not go away or that gets worse. Tell your dentist and dental surgeon that you are taking this medicine. You should not have major dental surgery while on this medicine. See your dentist to have a dental exam and fix any dental problems before starting this medicine. Take good care of your teeth while on this medicine. Make sure you see your dentist for regular follow-up appointments. What side effects may I notice from receiving this medicine? Side effects   that you should report to your doctor or health care professional as soon as possible: -allergic reactions like skin rash, itching or hives, swelling of the face, lips, or tongue -anxiety, confusion, or depression -breathing problems -changes in vision -eye pain -feeling faint  or lightheaded, falls -jaw pain, especially after dental work -mouth sores -muscle cramps, stiffness, or weakness -trouble passing urine or change in the amount of urine Side effects that usually do not require medical attention (report to your doctor or health care professional if they continue or are bothersome): -bone, joint, or muscle pain -constipation -diarrhea -fever -hair loss -irritation at site where injected -loss of appetite -nausea, vomiting -stomach upset -trouble sleeping -trouble swallowing -weak or tired This list may not describe all possible side effects. Call your doctor for medical advice about side effects. You may report side effects to FDA at 1-800-FDA-1088. Where should I keep my medicine? This drug is given in a hospital or clinic and will not be stored at home. NOTE: This sheet is a summary. It may not cover all possible information. If you have questions about this medicine, talk to your doctor, pharmacist, or health care provider.  2015, Elsevier/Gold Standard. (2012-11-21 13:03:13)  

## 2015-03-09 ENCOUNTER — Encounter: Payer: Self-pay | Admitting: Hematology and Oncology

## 2015-03-09 ENCOUNTER — Ambulatory Visit (HOSPITAL_BASED_OUTPATIENT_CLINIC_OR_DEPARTMENT_OTHER): Payer: Medicare HMO

## 2015-03-09 ENCOUNTER — Telehealth: Payer: Self-pay | Admitting: Hematology and Oncology

## 2015-03-09 ENCOUNTER — Other Ambulatory Visit (HOSPITAL_BASED_OUTPATIENT_CLINIC_OR_DEPARTMENT_OTHER): Payer: Medicare HMO

## 2015-03-09 ENCOUNTER — Ambulatory Visit (HOSPITAL_BASED_OUTPATIENT_CLINIC_OR_DEPARTMENT_OTHER): Payer: Medicare HMO | Admitting: Hematology and Oncology

## 2015-03-09 VITALS — BP 137/74 | HR 102 | Temp 98.3°F | Resp 18 | Ht 63.0 in | Wt 158.3 lb

## 2015-03-09 DIAGNOSIS — D839 Common variable immunodeficiency, unspecified: Secondary | ICD-10-CM | POA: Diagnosis not present

## 2015-03-09 DIAGNOSIS — C9000 Multiple myeloma not having achieved remission: Secondary | ICD-10-CM

## 2015-03-09 DIAGNOSIS — T50905A Adverse effect of unspecified drugs, medicaments and biological substances, initial encounter: Secondary | ICD-10-CM

## 2015-03-09 DIAGNOSIS — D6959 Other secondary thrombocytopenia: Secondary | ICD-10-CM | POA: Diagnosis not present

## 2015-03-09 DIAGNOSIS — D630 Anemia in neoplastic disease: Secondary | ICD-10-CM

## 2015-03-09 DIAGNOSIS — D801 Nonfamilial hypogammaglobulinemia: Secondary | ICD-10-CM

## 2015-03-09 DIAGNOSIS — Z5112 Encounter for antineoplastic immunotherapy: Secondary | ICD-10-CM | POA: Diagnosis not present

## 2015-03-09 DIAGNOSIS — Z9481 Bone marrow transplant status: Secondary | ICD-10-CM

## 2015-03-09 LAB — CBC WITH DIFFERENTIAL/PLATELET
BASO%: 0.6 % (ref 0.0–2.0)
Basophils Absolute: 0 10*3/uL (ref 0.0–0.1)
EOS%: 1.2 % (ref 0.0–7.0)
Eosinophils Absolute: 0 10*3/uL (ref 0.0–0.5)
HCT: 30.2 % — ABNORMAL LOW (ref 34.8–46.6)
HGB: 10.2 g/dL — ABNORMAL LOW (ref 11.6–15.9)
LYMPH%: 33.1 % (ref 14.0–49.7)
MCH: 30 pg (ref 25.1–34.0)
MCHC: 33.8 g/dL (ref 31.5–36.0)
MCV: 89 fL (ref 79.5–101.0)
MONO#: 0.2 10*3/uL (ref 0.1–0.9)
MONO%: 5.8 % (ref 0.0–14.0)
NEUT#: 2.3 10*3/uL (ref 1.5–6.5)
NEUT%: 59.3 % (ref 38.4–76.8)
Platelets: 84 10*3/uL — ABNORMAL LOW (ref 145–400)
RBC: 3.39 10*6/uL — ABNORMAL LOW (ref 3.70–5.45)
RDW: 16.2 % — ABNORMAL HIGH (ref 11.2–14.5)
WBC: 3.9 10*3/uL (ref 3.9–10.3)
lymph#: 1.3 10*3/uL (ref 0.9–3.3)

## 2015-03-09 LAB — COMPREHENSIVE METABOLIC PANEL (CC13)
ALT: 9 U/L (ref 0–55)
AST: 16 U/L (ref 5–34)
Albumin: 4.1 g/dL (ref 3.5–5.0)
Alkaline Phosphatase: 41 U/L (ref 40–150)
Anion Gap: 6 mEq/L (ref 3–11)
BUN: 15.2 mg/dL (ref 7.0–26.0)
CO2: 28 mEq/L (ref 22–29)
Calcium: 9.2 mg/dL (ref 8.4–10.4)
Chloride: 107 mEq/L (ref 98–109)
Creatinine: 0.8 mg/dL (ref 0.6–1.1)
EGFR: 76 mL/min/{1.73_m2} — ABNORMAL LOW (ref >90)
Glucose: 103 mg/dl (ref 70–140)
Potassium: 4.3 mEq/L (ref 3.5–5.1)
Sodium: 141 mEq/L (ref 136–145)
Total Bilirubin: 0.53 mg/dL (ref 0.20–1.20)
Total Protein: 7.2 g/dL (ref 6.4–8.3)

## 2015-03-09 MED ORDER — SODIUM CHLORIDE 0.9 % IV SOLN
Freq: Once | INTRAVENOUS | Status: AC
  Administered 2015-03-09: 12:00:00 via INTRAVENOUS

## 2015-03-09 MED ORDER — HEPARIN SOD (PORK) LOCK FLUSH 100 UNIT/ML IV SOLN
500.0000 [IU] | Freq: Once | INTRAVENOUS | Status: AC | PRN
Start: 2015-03-09 — End: 2015-03-09
  Administered 2015-03-09: 500 [IU]
  Filled 2015-03-09: qty 5

## 2015-03-09 MED ORDER — SODIUM CHLORIDE 0.9 % IV SOLN
Freq: Once | INTRAVENOUS | Status: AC
  Administered 2015-03-09: 12:00:00 via INTRAVENOUS
  Filled 2015-03-09: qty 4

## 2015-03-09 MED ORDER — DEXTROSE 5 % IV SOLN
10.0000 mg/m2 | Freq: Once | INTRAVENOUS | Status: AC
  Administered 2015-03-09: 18 mg via INTRAVENOUS
  Filled 2015-03-09: qty 9

## 2015-03-09 MED ORDER — SODIUM CHLORIDE 0.9 % IJ SOLN
10.0000 mL | INTRAMUSCULAR | Status: DC | PRN
  Administered 2015-03-09: 10 mL
  Filled 2015-03-09: qty 10

## 2015-03-09 MED ORDER — SODIUM CHLORIDE 0.9 % IV SOLN: Freq: Once | INTRAVENOUS | Status: DC

## 2015-03-09 NOTE — Assessment & Plan Note (Signed)
She is susceptible to opportunistic infections. Her recent viral panel came back abnormal. She is being evaluated at [facility] and would defer to them for further management. She is up-to-date with her vaccination.

## 2015-03-09 NOTE — Progress Notes (Signed)
[place] OFFICE PROGRESS NOTE  Patient Care Team: Jinny Sanders, MD as PCP - General (Family Medicine) [name], MD as Referring Physician (Hematology and Oncology) [name], MD as Consulting Physician (Internal Medicine) Truman Hayward, MD as Consulting Physician (Infectious Diseases)  SUMMARY OF ONCOLOGIC HISTORY: Oncology History   Multiple myeloma, Ig A Lambda, M spike 3.54 grams, Calcium 9.2, Creatinine 0.8, Beta 2 microglobulin 4.52, IgA 4840 mg/dL, lambda light chain 75.4, albumin 3.6, hemoglobin 9.7, platelet 115    Primary site: Multiple Myeloma   Staging method: AJCC 6th Edition   Clinical: Stage IIA signed by [name], MD on 11/07/2013  2:46 PM   Summary: Stage IIA        Multiple myeloma   10/31/2013 Bone Marrow Biopsy Bone marrow biopsy confirmed multiple myeloma with 40% bone marrow involvement. Skeletal survey showed minimal lesions in her score with generalized demineralization   11/10/2013 - 02/13/2014 Chemotherapy The patient is started on induction chemotherapy with weekly dexamethasone 40 mg by mouth as well as Velcade subcutaneous injection on days 1, 4, 8 and 11. On 11/21/2013, she was started on monthly Zometa.   12/23/2013 Adverse Reaction The dose of Velcade was reduced due to thrombocytopenia.   01/28/2014 - 04/07/2014 Chemotherapy Revlimid is added. Treatment was discontinued due to lack of response.   02/24/2014 - 04/07/2014 Chemotherapy Due to worsening peripheral neuropathy, Velcade injection is changed to once a week. Revlimid was given 21 days on, 7 days off.   04/07/2014 - 04/10/2014 Chemotherapy Revlimid was discontinued due to lack of response. Chemotherapy was changed back to Velcade injection twice a week, 2 weeks on 1 week off. Her treatment was switched to to minimum response   04/20/2014 - 06/02/2014 Chemotherapy chemotherapy is switched to Carfilzomib, Cytoxan and dexamethasone.   04/22/2014 Procedure she has  placement of port for chemotherapy.   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 She is seen prior to cycle 5, day 1 of treatment. She had recent bone marrow biopsy and extensive evaluation at [facility]. Her serum protein electrophoresis at [facility] showed that she is responding to treatment. She denies recent infection. She bruises easily. The patient denies any recent signs or symptoms of bleeding such as spontaneous epistaxis, hematuria or hematochezia. She denies new bone pain except freom recent bone marrow biopsy.  REVIEW OF SYSTEMS:   Constitutional: Denies fevers, chills or abnormal weight loss Eyes: Denies blurriness of vision Ears, nose, mouth, throat, and face: Denies mucositis or sore throat Respiratory: Denies cough, dyspnea or wheezes Cardiovascular: Denies palpitation, chest discomfort or lower extremity swelling Gastrointestinal:  Denies nausea, heartburn or change in bowel habits Skin: Denies abnormal skin rashes Lymphatics: Denies new lymphadenopathy  Neurological:Denies numbness, tingling or new weaknesses Behavioral/Psych: Mood is stable, no new changes  All other systems were reviewed with the patient and are negative.  I have reviewed the past medical history, past surgical history, social history and family history with the patient and they are unchanged from previous note.  ALLERGIES:  is allergic to penicillins and sulfa antibiotics.  MEDICATIONS:  Current Outpatient  Prescriptions  Medication Sig Dispense Refill  . acetaminophen (TYLENOL) 500 MG tablet Take 1,500 mg by mouth as needed for moderate pain, fever or headache.     Marland Kitchen acyclovir (ZOVIRAX) 400 MG tablet Take 1 tablet (400 mg total) by mouth 2 (two) times daily. 180 tablet 3  . calcium carbonate (TUMS EX) 750 MG chewable tablet Chew 1 tablet by mouth 3 (three) times daily as needed for heartburn.     . carvedilol (COREG) 6.25 MG tablet Take 0.5 tablets (3.125 mg total) by mouth 2 (two) times daily with a meal. 90 tablet 3  . cholecalciferol (VITAMIN D) 1000 UNITS tablet Take 1,000 Units by mouth daily.    Marland Kitchen docusate sodium (COLACE) 100 MG capsule Take 100 mg by mouth 2 (two) times daily as needed for mild constipation or moderate constipation.     Marland Kitchen lenalidomide (REVLIMID) 15 MG capsule Take 1 capsule (15 mg total) by mouth daily. 21 capsule 0  . loperamide (IMODIUM A-D) 2 MG tablet Take 2 mg by mouth 4 (four) times daily as needed for diarrhea or loose stools.    . mirtazapine (REMERON) 30 MG tablet TAKE 1 TABLET BY MOUTH AT BEDTIME (Patient taking differently: TAKE 1/2 TABLET BY MOUTH AT BEDTIME) 60 tablet 1  . Multiple Vitamin (MULTIVITAMIN WITH MINERALS) TABS tablet Take 1 tablet by mouth daily.    . ondansetron (ZOFRAN) 8 MG tablet Take 1 tablet (8 mg total) by mouth 2 (two) times daily. Start the day after chemo for 2 days. Then as needed for nausea or vomiting. 30 tablet 1  . OXYQUINOLONE SULFATE VAGINAL (TRIMO-SAN) 0.025 % GEL Place 1 application vaginally 2 (two) times a week.     No current facility-administered medications for this visit.    PHYSICAL EXAMINATION: ECOG PERFORMANCE STATUS: 1 - Symptomatic but completely ambulatory  Filed Vitals:   03/09/15 0912  BP: 137/74  Pulse: 102  Temp: 98.3 F (36.8 C)  Resp: 18   Filed Weights   03/09/15 0912  Weight: 158 lb 4.8 oz (71.804 kg)    GENERAL:alert, no distress and comfortable SKIN: skin color, texture, turgor are normal, no  rashes or significant lesions EYES: normal, Conjunctiva are pink and non-injected, sclera clear Musculoskeletal:no cyanosis of digits and no clubbing  NEURO: alert & oriented x 3 with fluent speech, no focal motor/sensory deficits  LABORATORY DATA:  I have reviewed the data as listed    Component Value Date/Time   NA 141 03/09/2015 0901   NA 139 01/03/2015 2021   K 4.3 03/09/2015 0901   K 4.0 01/03/2015 2021   CL 109 01/03/2015 2021   CO2 28 03/09/2015 0901   CO2 25 01/03/2015 2021   GLUCOSE 103 03/09/2015 0901   GLUCOSE 115* 01/03/2015 2021   BUN 15.2 03/09/2015 0901   BUN 13 01/03/2015 2021   CREATININE 0.8 03/09/2015 0901   CREATININE 0.68 01/03/2015 2021   CALCIUM 9.2 03/09/2015 0901   CALCIUM 8.3* 01/03/2015 2021   PROT 7.2 03/09/2015 0901   PROT 6.5 01/03/2015 2021   ALBUMIN 4.1 03/09/2015 0901   ALBUMIN 4.1 01/03/2015 2021   AST 16 03/09/2015 0901   AST 18 01/03/2015 2021   ALT 9 03/09/2015 0901   ALT 11* 01/03/2015 2021   ALKPHOS 41 03/09/2015 0901   ALKPHOS 47 01/03/2015 2021   BILITOT 0.53 03/09/2015 0901   BILITOT 0.6 01/03/2015 2021   GFRNONAA >60  01/03/2015 2021   GFRAA >60 01/03/2015 2021    No results found for: SPEP, UPEP  Lab Results  Component Value Date   WBC 3.9 03/09/2015   NEUTROABS 2.3 03/09/2015   HGB 10.2* 03/09/2015   HCT 30.2* 03/09/2015   MCV 89.0 03/09/2015   PLT 84* 03/09/2015      Chemistry      Component Value Date/Time   NA 141 03/09/2015 0901   NA 139 01/03/2015 2021   K 4.3 03/09/2015 0901   K 4.0 01/03/2015 2021   CL 109 01/03/2015 2021   CO2 28 03/09/2015 0901   CO2 25 01/03/2015 2021   BUN 15.2 03/09/2015 0901   BUN 13 01/03/2015 2021   CREATININE 0.8 03/09/2015 0901   CREATININE 0.68 01/03/2015 2021      Component Value Date/Time   CALCIUM 9.2 03/09/2015 0901   CALCIUM 8.3* 01/03/2015 2021   ALKPHOS 41 03/09/2015 0901   ALKPHOS 47 01/03/2015 2021   AST 16 03/09/2015 0901   AST 18 01/03/2015 2021   ALT 9  03/09/2015 0901   ALT 11* 01/03/2015 2021   BILITOT 0.53 03/09/2015 0901   BILITOT 0.6 01/03/2015 2021      ASSESSMENT & PLAN:  Multiple myeloma Her pancytopenia is improving. Bone marrow biopsy from [facility] is pending. She is doing well with dose adjusted Kyprolis treatment. I will continue at 50% dose adjustment, monthly IVIG and continue to hold Revlimid. If her pancytopenia remains stable next month, I will resume Revlimid. She will also receive Zometa every month, next due in October.   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 She is not fighting off infection well. I discussed with her the role of IVIG for primary immunodeficiency. Risk, benefit, side effects of IVIG was discussed and she agreed to proceed. We will continue 0.5 g/kg every 4 weeks.  Anemia in neoplastic disease This is likely anemia of chronic disease. The patient denies recent history of bleeding such as epistaxis, hematuria or hematochezia. She is asymptomatic from the anemia. We will observe for now.  She does not require transfusion now.     Thrombocytopenia due to drugs This is likely due to recent treatment. The patient denies recent history of bleeding such as epistaxis, hematuria or hematochezia. She is asymptomatic from the low platelet count. I will observe for now.  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 Her recent viral panel came back abnormal. She is being evaluated at [facility] and would defer to them for further management. She is up-to-date with her vaccination.      No orders of the defined types were placed in this encounter.   All questions were answered. The patient knows to call the clinic with any problems, questions or concerns. No barriers to learning was detected. I spent 25 minutes counseling the patient face to  face. The total time spent in the appointment was 40 minutes and more than 50% was on counseling and review of test results     [facility], Kimley Apsey, MD 03/09/2015 10:28 AM

## 2015-03-09 NOTE — Assessment & Plan Note (Signed)
This is related to her bone marrow corresponds status and recent treatment. She is not fighting off infection well. I discussed with her the role of IVIG for primary immunodeficiency. Risk, benefit, side effects of IVIG was discussed and she agreed to proceed. We will continue 0.5 g/kg every 4 weeks.  

## 2015-03-09 NOTE — Assessment & Plan Note (Addendum)
Her pancytopenia is improving. Bone marrow biopsy from [facility] is pending. She is doing well with dose adjusted Kyprolis treatment. I will continue at 50% dose adjustment, monthly IVIG and continue to hold Revlimid. If her pancytopenia remains stable next month, I will resume Revlimid. She will also receive Zometa every month, next due in October.

## 2015-03-09 NOTE — Telephone Encounter (Signed)
perp of to sch pt appt-perpof to sch pt flush-pt req no flush be scheduled-sent MW email to sch trmt-pt stated AlWAYS check MY CHART and never checks voicemail @ home

## 2015-03-09 NOTE — Assessment & Plan Note (Signed)
This is likely anemia of chronic disease. The patient denies recent history of bleeding such as epistaxis, hematuria or hematochezia. She is asymptomatic from the anemia. We will observe for now.  She does not require transfusion now.   

## 2015-03-09 NOTE — Assessment & Plan Note (Signed)
This is likely due to recent treatment. The patient denies recent history of bleeding such as epistaxis, hematuria or hematochezia. She is asymptomatic from the low platelet count. I will observe for now.  Plan to hold treatment now if platelet count is less than 50,000

## 2015-03-09 NOTE — Patient Instructions (Signed)
[facility] Discharge Instructions for Patients Receiving Chemotherapy  Today you received the following chemotherapy agents: Kyprolis.  To help prevent nausea and vomiting after your treatment, we encourage you to take your nausea medication: Zofran. Take one every 8 hours as needed.   If you develop nausea and vomiting that is not controlled by your nausea medication, call the clinic.   BELOW ARE SYMPTOMS THAT SHOULD BE REPORTED IMMEDIATELY:  *FEVER GREATER THAN 100.5 F  *CHILLS WITH OR WITHOUT FEVER  NAUSEA AND VOMITING THAT IS NOT CONTROLLED WITH YOUR NAUSEA MEDICATION  *UNUSUAL SHORTNESS OF BREATH  *UNUSUAL BRUISING OR BLEEDING  TENDERNESS IN MOUTH AND THROAT WITH OR WITHOUT PRESENCE OF ULCERS  *URINARY PROBLEMS  *BOWEL PROBLEMS  UNUSUAL RASH Items with * indicate a potential emergency and should be followed up as soon as possible.  Feel free to call the clinic should you have any questions or concerns. The clinic phone number is [phone].  Please show the CHEMO ALERT CARD at check-in to the Emergency Department and triage nurse.   

## 2015-03-10 ENCOUNTER — Ambulatory Visit (HOSPITAL_BASED_OUTPATIENT_CLINIC_OR_DEPARTMENT_OTHER): Payer: Medicare HMO

## 2015-03-10 VITALS — BP 145/69 | HR 81 | Temp 96.9°F | Resp 20

## 2015-03-10 DIAGNOSIS — Z5112 Encounter for antineoplastic immunotherapy: Secondary | ICD-10-CM | POA: Diagnosis not present

## 2015-03-10 DIAGNOSIS — C9000 Multiple myeloma not having achieved remission: Secondary | ICD-10-CM | POA: Diagnosis not present

## 2015-03-10 MED ORDER — HEPARIN SOD (PORK) LOCK FLUSH 100 UNIT/ML IV SOLN
500.0000 [IU] | Freq: Once | INTRAVENOUS | Status: AC | PRN
  Administered 2015-03-10: 500 [IU]
  Filled 2015-03-10: qty 5

## 2015-03-10 MED ORDER — SODIUM CHLORIDE 0.9 % IV SOLN
Freq: Once | INTRAVENOUS | Status: AC
  Administered 2015-03-10: 09:00:00 via INTRAVENOUS

## 2015-03-10 MED ORDER — SODIUM CHLORIDE 0.9 % IV SOLN
Freq: Once | INTRAVENOUS | Status: AC
  Administered 2015-03-10: 09:00:00 via INTRAVENOUS
  Filled 2015-03-10: qty 4

## 2015-03-10 MED ORDER — DEXTROSE 5 % IV SOLN
10.0000 mg/m2 | Freq: Once | INTRAVENOUS | Status: AC
  Administered 2015-03-10: 18 mg via INTRAVENOUS
  Filled 2015-03-10: qty 9

## 2015-03-10 MED ORDER — SODIUM CHLORIDE 0.9 % IJ SOLN
10.0000 mL | INTRAMUSCULAR | Status: DC | PRN
  Administered 2015-03-10: 10 mL
  Filled 2015-03-10: qty 10

## 2015-03-10 NOTE — Patient Instructions (Signed)
[facility] Discharge Instructions for Patients Receiving Chemotherapy  Today you received the following chemotherapy agents: Kyprolis.  To help prevent nausea and vomiting after your treatment, we encourage you to take your nausea medication: Zofran. Take one every 8 hours as needed.   If you develop nausea and vomiting that is not controlled by your nausea medication, call the clinic.   BELOW ARE SYMPTOMS THAT SHOULD BE REPORTED IMMEDIATELY:  *FEVER GREATER THAN 100.5 F  *CHILLS WITH OR WITHOUT FEVER  NAUSEA AND VOMITING THAT IS NOT CONTROLLED WITH YOUR NAUSEA MEDICATION  *UNUSUAL SHORTNESS OF BREATH  *UNUSUAL BRUISING OR BLEEDING  TENDERNESS IN MOUTH AND THROAT WITH OR WITHOUT PRESENCE OF ULCERS  *URINARY PROBLEMS  *BOWEL PROBLEMS  UNUSUAL RASH Items with * indicate a potential emergency and should be followed up as soon as possible.  Feel free to call the clinic should you have any questions or concerns. The clinic phone number is [phone].  Please show the CHEMO ALERT CARD at check-in to the Emergency Department and triage nurse.   

## 2015-03-11 ENCOUNTER — Ambulatory Visit: Payer: Medicare HMO | Admitting: Infectious Disease

## 2015-03-11 ENCOUNTER — Encounter: Payer: Self-pay | Admitting: Hematology and Oncology

## 2015-03-11 ENCOUNTER — Other Ambulatory Visit: Payer: Self-pay | Admitting: Hematology and Oncology

## 2015-03-16 ENCOUNTER — Encounter: Payer: Self-pay | Admitting: Hematology and Oncology

## 2015-03-16 ENCOUNTER — Other Ambulatory Visit (HOSPITAL_BASED_OUTPATIENT_CLINIC_OR_DEPARTMENT_OTHER): Payer: Medicare HMO

## 2015-03-16 ENCOUNTER — Ambulatory Visit (HOSPITAL_BASED_OUTPATIENT_CLINIC_OR_DEPARTMENT_OTHER): Payer: Medicare HMO

## 2015-03-16 DIAGNOSIS — Z5112 Encounter for antineoplastic immunotherapy: Secondary | ICD-10-CM

## 2015-03-16 DIAGNOSIS — C9000 Multiple myeloma not having achieved remission: Secondary | ICD-10-CM | POA: Diagnosis not present

## 2015-03-16 LAB — CBC WITH DIFFERENTIAL/PLATELET
BASO%: 0.7 % (ref 0.0–2.0)
Basophils Absolute: 0 10*3/uL (ref 0.0–0.1)
EOS%: 0.9 % (ref 0.0–7.0)
Eosinophils Absolute: 0.1 10*3/uL (ref 0.0–0.5)
HCT: 28 % — ABNORMAL LOW (ref 34.8–46.6)
HGB: 9.1 g/dL — ABNORMAL LOW (ref 11.6–15.9)
LYMPH%: 32 % (ref 14.0–49.7)
MCH: 29 pg (ref 25.1–34.0)
MCHC: 32.5 g/dL (ref 31.5–36.0)
MCV: 89.3 fL (ref 79.5–101.0)
MONO#: 0.3 10*3/uL (ref 0.1–0.9)
MONO%: 4.9 % (ref 0.0–14.0)
NEUT#: 3.3 10*3/uL (ref 1.5–6.5)
NEUT%: 61.5 % (ref 38.4–76.8)
Platelets: 108 10*3/uL — ABNORMAL LOW (ref 145–400)
RBC: 3.13 10*6/uL — ABNORMAL LOW (ref 3.70–5.45)
RDW: 17 % — ABNORMAL HIGH (ref 11.2–14.5)
WBC: 5.3 10*3/uL (ref 3.9–10.3)
lymph#: 1.7 10*3/uL (ref 0.9–3.3)

## 2015-03-16 LAB — COMPREHENSIVE METABOLIC PANEL (CC13)
ALT: 10 U/L (ref 0–55)
AST: 18 U/L (ref 5–34)
Albumin: 4 g/dL (ref 3.5–5.0)
Alkaline Phosphatase: 46 U/L (ref 40–150)
Anion Gap: 6 mEq/L (ref 3–11)
BUN: 10.3 mg/dL (ref 7.0–26.0)
CO2: 29 mEq/L (ref 22–29)
Calcium: 9.2 mg/dL (ref 8.4–10.4)
Chloride: 105 mEq/L (ref 98–109)
Creatinine: 0.8 mg/dL (ref 0.6–1.1)
EGFR: 79 mL/min/{1.73_m2} — ABNORMAL LOW (ref >90)
Glucose: 96 mg/dl (ref 70–140)
Potassium: 4.3 mEq/L (ref 3.5–5.1)
Sodium: 140 mEq/L (ref 136–145)
Total Bilirubin: 0.41 mg/dL (ref 0.20–1.20)
Total Protein: 6.9 g/dL (ref 6.4–8.3)

## 2015-03-16 LAB — TECHNOLOGIST REVIEW

## 2015-03-16 MED ORDER — HEPARIN SOD (PORK) LOCK FLUSH 100 UNIT/ML IV SOLN
500.0000 [IU] | Freq: Once | INTRAVENOUS | Status: AC | PRN
  Filled 2015-03-16: qty 5

## 2015-03-16 MED ORDER — SODIUM CHLORIDE 0.9 % IV SOLN
Freq: Once | INTRAVENOUS | Status: AC
  Administered 2015-03-16: 14:00:00 via INTRAVENOUS
  Filled 2015-03-16: qty 4

## 2015-03-16 MED ORDER — SODIUM CHLORIDE 0.9 % IJ SOLN
10.0000 mL | INTRAMUSCULAR | Status: DC | PRN
  Administered 2015-03-16: 10 mL
  Filled 2015-03-16: qty 10

## 2015-03-16 MED ORDER — SODIUM CHLORIDE 0.9 % IV SOLN
Freq: Once | INTRAVENOUS | Status: AC
  Administered 2015-03-16: 13:00:00 via INTRAVENOUS

## 2015-03-16 MED ORDER — DEXTROSE 5 % IV SOLN
10.0000 mg/m2 | Freq: Once | INTRAVENOUS | Status: AC
  Administered 2015-03-16: 18 mg via INTRAVENOUS
  Filled 2015-03-16: qty 9

## 2015-03-16 MED ORDER — SODIUM CHLORIDE 0.9 % IV SOLN
Freq: Once | INTRAVENOUS | Status: AC
  Administered 2015-03-16: 14:00:00 via INTRAVENOUS

## 2015-03-16 NOTE — Patient Instructions (Signed)
[facility] Discharge Instructions for Patients Receiving Chemotherapy  Today you received the following chemotherapy agents kyprolis  To help prevent nausea and vomiting after your treatment, we encourage you to take your nausea medication.   If you develop nausea and vomiting that is not controlled by your nausea medication, call the clinic.   BELOW ARE SYMPTOMS THAT SHOULD BE REPORTED IMMEDIATELY:  *FEVER GREATER THAN 100.5 F  *CHILLS WITH OR WITHOUT FEVER  NAUSEA AND VOMITING THAT IS NOT CONTROLLED WITH YOUR NAUSEA MEDICATION  *UNUSUAL SHORTNESS OF BREATH  *UNUSUAL BRUISING OR BLEEDING  TENDERNESS IN MOUTH AND THROAT WITH OR WITHOUT PRESENCE OF ULCERS  *URINARY PROBLEMS  *BOWEL PROBLEMS  UNUSUAL RASH Items with * indicate a potential emergency and should be followed up as soon as possible.  Feel free to call the clinic you have any questions or concerns. The clinic phone number is [phone].  Please show the CHEMO ALERT CARD at check-in to the Emergency Department and triage nurse.   

## 2015-03-17 ENCOUNTER — Encounter: Payer: Self-pay | Admitting: Hematology and Oncology

## 2015-03-17 ENCOUNTER — Ambulatory Visit (HOSPITAL_BASED_OUTPATIENT_CLINIC_OR_DEPARTMENT_OTHER): Payer: Medicare HMO

## 2015-03-17 VITALS — BP 150/66 | HR 98 | Temp 97.7°F | Resp 18

## 2015-03-17 DIAGNOSIS — C9000 Multiple myeloma not having achieved remission: Secondary | ICD-10-CM | POA: Diagnosis not present

## 2015-03-17 DIAGNOSIS — Z5112 Encounter for antineoplastic immunotherapy: Secondary | ICD-10-CM | POA: Diagnosis not present

## 2015-03-17 MED ORDER — SODIUM CHLORIDE 0.9 % IV SOLN
Freq: Once | INTRAVENOUS | Status: AC
  Administered 2015-03-17: 09:00:00 via INTRAVENOUS

## 2015-03-17 MED ORDER — HEPARIN SOD (PORK) LOCK FLUSH 100 UNIT/ML IV SOLN
500.0000 [IU] | Freq: Once | INTRAVENOUS | Status: AC | PRN
  Administered 2015-03-17: 500 [IU]
  Filled 2015-03-17: qty 5

## 2015-03-17 MED ORDER — SODIUM CHLORIDE 0.9 % IJ SOLN
10.0000 mL | INTRAMUSCULAR | Status: DC | PRN
  Administered 2015-03-17: 10 mL
  Filled 2015-03-17: qty 10

## 2015-03-17 MED ORDER — SODIUM CHLORIDE 0.9 % IV SOLN
Freq: Once | INTRAVENOUS | Status: AC
  Administered 2015-03-17: 09:00:00 via INTRAVENOUS
  Filled 2015-03-17: qty 4

## 2015-03-17 MED ORDER — SODIUM CHLORIDE 0.9 % IV SOLN
250.0000 mL | Freq: Once | INTRAVENOUS | Status: AC
  Administered 2015-03-17: 250 mL via INTRAVENOUS

## 2015-03-17 MED ORDER — CARFILZOMIB CHEMO INJECTION 60 MG
10.0000 mg/m2 | Freq: Once | INTRAVENOUS | Status: AC
  Administered 2015-03-17: 18 mg via INTRAVENOUS
  Filled 2015-03-17: qty 9

## 2015-03-17 NOTE — Patient Instructions (Signed)
[facility] Discharge Instructions for Patients Receiving Chemotherapy  Today you received the following chemotherapy agents kyprolis  To help prevent nausea and vomiting after your treatment, we encourage you to take your nausea medication.   If you develop nausea and vomiting that is not controlled by your nausea medication, call the clinic.   BELOW ARE SYMPTOMS THAT SHOULD BE REPORTED IMMEDIATELY:  *FEVER GREATER THAN 100.5 F  *CHILLS WITH OR WITHOUT FEVER  NAUSEA AND VOMITING THAT IS NOT CONTROLLED WITH YOUR NAUSEA MEDICATION  *UNUSUAL SHORTNESS OF BREATH  *UNUSUAL BRUISING OR BLEEDING  TENDERNESS IN MOUTH AND THROAT WITH OR WITHOUT PRESENCE OF ULCERS  *URINARY PROBLEMS  *BOWEL PROBLEMS  UNUSUAL RASH Items with * indicate a potential emergency and should be followed up as soon as possible.  Feel free to call the clinic you have any questions or concerns. The clinic phone number is [phone].  Please show the CHEMO ALERT CARD at check-in to the Emergency Department and triage nurse.   

## 2015-03-19 ENCOUNTER — Ambulatory Visit (HOSPITAL_BASED_OUTPATIENT_CLINIC_OR_DEPARTMENT_OTHER): Payer: Medicare HMO

## 2015-03-19 VITALS — BP 120/52 | HR 73 | Temp 98.6°F | Resp 18

## 2015-03-19 DIAGNOSIS — D801 Nonfamilial hypogammaglobulinemia: Secondary | ICD-10-CM

## 2015-03-19 DIAGNOSIS — C9000 Multiple myeloma not having achieved remission: Secondary | ICD-10-CM

## 2015-03-19 MED ORDER — ACETAMINOPHEN 325 MG PO TABS
ORAL_TABLET | ORAL | Status: AC
  Filled 2015-03-19: qty 2

## 2015-03-19 MED ORDER — SODIUM CHLORIDE 0.9 % IV SOLN
INTRAVENOUS | Status: DC
  Administered 2015-03-19: 10:00:00 via INTRAVENOUS

## 2015-03-19 MED ORDER — IMMUNE GLOBULIN (HUMAN) 20 GM/200ML IV SOLN
0.5000 g/kg | INTRAVENOUS | Status: DC
  Administered 2015-03-19: 35 g via INTRAVENOUS
  Filled 2015-03-19: qty 350

## 2015-03-19 MED ORDER — ACETAMINOPHEN 325 MG PO TABS
650.0000 mg | ORAL_TABLET | Freq: Once | ORAL | Status: AC
  Administered 2015-03-19: 650 mg via ORAL

## 2015-03-19 MED ORDER — DIPHENHYDRAMINE HCL 25 MG PO CAPS
ORAL_CAPSULE | ORAL | Status: AC
  Filled 2015-03-19: qty 1

## 2015-03-19 MED ORDER — DIPHENHYDRAMINE HCL 25 MG PO CAPS
25.0000 mg | ORAL_CAPSULE | Freq: Once | ORAL | Status: AC
  Administered 2015-03-19: 25 mg via ORAL

## 2015-03-19 MED ORDER — HEPARIN SOD (PORK) LOCK FLUSH 100 UNIT/ML IV SOLN
500.0000 [IU] | Freq: Once | INTRAVENOUS | Status: AC | PRN
  Administered 2015-03-19: 500 [IU]
  Filled 2015-03-19: qty 5

## 2015-03-19 MED ORDER — SODIUM CHLORIDE 0.9 % IJ SOLN
10.0000 mL | INTRAMUSCULAR | Status: DC | PRN
  Administered 2015-03-19: 10 mL
  Filled 2015-03-19: qty 10

## 2015-03-19 NOTE — Patient Instructions (Signed)

## 2015-03-23 ENCOUNTER — Ambulatory Visit (HOSPITAL_BASED_OUTPATIENT_CLINIC_OR_DEPARTMENT_OTHER): Payer: Medicare HMO

## 2015-03-23 ENCOUNTER — Encounter: Payer: Self-pay | Admitting: Hematology and Oncology

## 2015-03-23 ENCOUNTER — Other Ambulatory Visit (HOSPITAL_BASED_OUTPATIENT_CLINIC_OR_DEPARTMENT_OTHER): Payer: Medicare HMO

## 2015-03-23 VITALS — BP 150/75 | HR 111 | Temp 97.6°F | Resp 18

## 2015-03-23 DIAGNOSIS — C9000 Multiple myeloma not having achieved remission: Secondary | ICD-10-CM

## 2015-03-23 DIAGNOSIS — Z5112 Encounter for antineoplastic immunotherapy: Secondary | ICD-10-CM | POA: Diagnosis not present

## 2015-03-23 LAB — CBC WITH DIFFERENTIAL/PLATELET
BASO%: 0.5 % (ref 0.0–2.0)
Basophils Absolute: 0 10*3/uL (ref 0.0–0.1)
EOS%: 0.6 % (ref 0.0–7.0)
Eosinophils Absolute: 0 10*3/uL (ref 0.0–0.5)
HCT: 26.2 % — ABNORMAL LOW (ref 34.8–46.6)
HGB: 8.5 g/dL — ABNORMAL LOW (ref 11.6–15.9)
LYMPH%: 23 % (ref 14.0–49.7)
MCH: 28.5 pg (ref 25.1–34.0)
MCHC: 32.5 g/dL (ref 31.5–36.0)
MCV: 87.6 fL (ref 79.5–101.0)
MONO#: 0.2 10*3/uL (ref 0.1–0.9)
MONO%: 3.8 % (ref 0.0–14.0)
NEUT#: 4.2 10*3/uL (ref 1.5–6.5)
NEUT%: 72.1 % (ref 38.4–76.8)
Platelets: 97 10*3/uL — ABNORMAL LOW (ref 145–400)
RBC: 2.99 10*6/uL — ABNORMAL LOW (ref 3.70–5.45)
RDW: 16.7 % — ABNORMAL HIGH (ref 11.2–14.5)
WBC: 5.9 10*3/uL (ref 3.9–10.3)
lymph#: 1.3 10*3/uL (ref 0.9–3.3)

## 2015-03-23 LAB — COMPREHENSIVE METABOLIC PANEL (CC13)
ALT: 9 U/L (ref 0–55)
AST: 17 U/L (ref 5–34)
Albumin: 3.7 g/dL (ref 3.5–5.0)
Alkaline Phosphatase: 43 U/L (ref 40–150)
Anion Gap: 7 mEq/L (ref 3–11)
BUN: 14.4 mg/dL (ref 7.0–26.0)
CO2: 24 mEq/L (ref 22–29)
Calcium: 8.7 mg/dL (ref 8.4–10.4)
Chloride: 106 mEq/L (ref 98–109)
Creatinine: 0.8 mg/dL (ref 0.6–1.1)
EGFR: 72 mL/min/{1.73_m2} — ABNORMAL LOW (ref >90)
Glucose: 120 mg/dl (ref 70–140)
Potassium: 4 mEq/L (ref 3.5–5.1)
Sodium: 138 mEq/L (ref 136–145)
Total Bilirubin: 0.49 mg/dL (ref 0.20–1.20)
Total Protein: 7.1 g/dL (ref 6.4–8.3)

## 2015-03-23 LAB — TECHNOLOGIST REVIEW

## 2015-03-23 MED ORDER — SODIUM CHLORIDE 0.9 % IV SOLN
Freq: Once | INTRAVENOUS | Status: AC
  Administered 2015-03-23: 10:00:00 via INTRAVENOUS
  Filled 2015-03-23: qty 4

## 2015-03-23 MED ORDER — SODIUM CHLORIDE 0.9 % IV SOLN
Freq: Once | INTRAVENOUS | Status: AC
  Administered 2015-03-23: 09:00:00 via INTRAVENOUS

## 2015-03-23 MED ORDER — SODIUM CHLORIDE 0.9 % IJ SOLN
10.0000 mL | INTRAMUSCULAR | Status: DC | PRN
  Administered 2015-03-23: 10 mL
  Filled 2015-03-23: qty 10

## 2015-03-23 MED ORDER — DEXTROSE 5 % IV SOLN
10.0000 mg/m2 | Freq: Once | INTRAVENOUS | Status: AC
  Administered 2015-03-23: 18 mg via INTRAVENOUS
  Filled 2015-03-23: qty 9

## 2015-03-23 MED ORDER — HEPARIN SOD (PORK) LOCK FLUSH 100 UNIT/ML IV SOLN
500.0000 [IU] | Freq: Once | INTRAVENOUS | Status: AC | PRN
  Administered 2015-03-23: 500 [IU]
  Filled 2015-03-23: qty 5

## 2015-03-23 NOTE — Patient Instructions (Signed)
[facility] Discharge Instructions for Patients Receiving Chemotherapy  Today you received the following chemotherapy agents kyprolis  To help prevent nausea and vomiting after your treatment, we encourage you to take your nausea medication.   If you develop nausea and vomiting that is not controlled by your nausea medication, call the clinic.   BELOW ARE SYMPTOMS THAT SHOULD BE REPORTED IMMEDIATELY:  *FEVER GREATER THAN 100.5 F  *CHILLS WITH OR WITHOUT FEVER  NAUSEA AND VOMITING THAT IS NOT CONTROLLED WITH YOUR NAUSEA MEDICATION  *UNUSUAL SHORTNESS OF BREATH  *UNUSUAL BRUISING OR BLEEDING  TENDERNESS IN MOUTH AND THROAT WITH OR WITHOUT PRESENCE OF ULCERS  *URINARY PROBLEMS  *BOWEL PROBLEMS  UNUSUAL RASH Items with * indicate a potential emergency and should be followed up as soon as possible.  Feel free to call the clinic you have any questions or concerns. The clinic phone number is [phone].  Please show the CHEMO ALERT CARD at check-in to the Emergency Department and triage nurse.   

## 2015-03-24 ENCOUNTER — Ambulatory Visit (HOSPITAL_BASED_OUTPATIENT_CLINIC_OR_DEPARTMENT_OTHER): Payer: Medicare HMO

## 2015-03-24 VITALS — BP 143/64 | HR 73 | Temp 98.8°F | Resp 20

## 2015-03-24 DIAGNOSIS — Z5112 Encounter for antineoplastic immunotherapy: Secondary | ICD-10-CM | POA: Diagnosis not present

## 2015-03-24 DIAGNOSIS — C9000 Multiple myeloma not having achieved remission: Secondary | ICD-10-CM | POA: Diagnosis not present

## 2015-03-24 MED ORDER — HEPARIN SOD (PORK) LOCK FLUSH 100 UNIT/ML IV SOLN
500.0000 [IU] | Freq: Once | INTRAVENOUS | Status: AC | PRN
  Administered 2015-03-24: 500 [IU]
  Filled 2015-03-24: qty 5

## 2015-03-24 MED ORDER — SODIUM CHLORIDE 0.9 % IV SOLN
Freq: Once | INTRAVENOUS | Status: AC
  Administered 2015-03-24: 10:00:00 via INTRAVENOUS

## 2015-03-24 MED ORDER — SODIUM CHLORIDE 0.9 % IV SOLN: Freq: Once | INTRAVENOUS | Status: DC

## 2015-03-24 MED ORDER — SODIUM CHLORIDE 0.9 % IJ SOLN
10.0000 mL | INTRAMUSCULAR | Status: DC | PRN
  Administered 2015-03-24: 10 mL
  Filled 2015-03-24: qty 10

## 2015-03-24 MED ORDER — DEXTROSE 5 % IV SOLN
10.0000 mg/m2 | Freq: Once | INTRAVENOUS | Status: AC
  Administered 2015-03-24: 18 mg via INTRAVENOUS
  Filled 2015-03-24: qty 9

## 2015-03-24 MED ORDER — SODIUM CHLORIDE 0.9 % IV SOLN
Freq: Once | INTRAVENOUS | Status: AC
  Administered 2015-03-24: 10:00:00 via INTRAVENOUS
  Filled 2015-03-24: qty 4

## 2015-03-24 NOTE — Patient Instructions (Signed)
[facility] Discharge Instructions for Patients Receiving Chemotherapy  Today you received the following chemotherapy agents:  Kyprolis  To help prevent nausea and vomiting after your treatment, we encourage you to take your nausea medication as ordered per MD.   If you develop nausea and vomiting that is not controlled by your nausea medication, call the clinic.   BELOW ARE SYMPTOMS THAT SHOULD BE REPORTED IMMEDIATELY:  *FEVER GREATER THAN 100.5 F  *CHILLS WITH OR WITHOUT FEVER  NAUSEA AND VOMITING THAT IS NOT CONTROLLED WITH YOUR NAUSEA MEDICATION  *UNUSUAL SHORTNESS OF BREATH  *UNUSUAL BRUISING OR BLEEDING  TENDERNESS IN MOUTH AND THROAT WITH OR WITHOUT PRESENCE OF ULCERS  *URINARY PROBLEMS  *BOWEL PROBLEMS  UNUSUAL RASH Items with * indicate a potential emergency and should be followed up as soon as possible.  Feel free to call the clinic you have any questions or concerns. The clinic phone number is [phone].  Please show the CHEMO ALERT CARD at check-in to the Emergency Department and triage nurse.   

## 2015-04-06 ENCOUNTER — Telehealth: Payer: Self-pay | Admitting: *Deleted

## 2015-04-06 ENCOUNTER — Ambulatory Visit (HOSPITAL_BASED_OUTPATIENT_CLINIC_OR_DEPARTMENT_OTHER): Payer: Medicare HMO

## 2015-04-06 ENCOUNTER — Ambulatory Visit (HOSPITAL_COMMUNITY)
Admission: RE | Admit: 2015-04-06 | Discharge: 2015-04-06 | Disposition: A | Discharge status: Home / Self Care | Payer: Medicare HMO | Source: Ambulatory Visit | Attending: Hematology and Oncology | Admitting: Hematology and Oncology

## 2015-04-06 ENCOUNTER — Ambulatory Visit (HOSPITAL_BASED_OUTPATIENT_CLINIC_OR_DEPARTMENT_OTHER): Payer: Medicare HMO | Admitting: Hematology and Oncology

## 2015-04-06 ENCOUNTER — Encounter: Payer: Self-pay | Admitting: Hematology and Oncology

## 2015-04-06 ENCOUNTER — Other Ambulatory Visit (HOSPITAL_COMMUNITY)
Admission: RE | Admit: 2015-04-06 | Discharge: 2015-04-06 | Disposition: A | Discharge status: Home / Self Care | Payer: Medicare HMO | Source: Ambulatory Visit | Attending: Hematology and Oncology | Admitting: Hematology and Oncology

## 2015-04-06 ENCOUNTER — Other Ambulatory Visit (HOSPITAL_BASED_OUTPATIENT_CLINIC_OR_DEPARTMENT_OTHER): Payer: Medicare HMO

## 2015-04-06 VITALS — BP 139/61 | HR 87 | Temp 98.4°F | Resp 18

## 2015-04-06 VITALS — BP 147/63 | HR 102 | Temp 97.5°F | Resp 21 | Ht 63.0 in | Wt 159.9 lb

## 2015-04-06 DIAGNOSIS — D469 Myelodysplastic syndrome, unspecified: Secondary | ICD-10-CM

## 2015-04-06 DIAGNOSIS — C9000 Multiple myeloma not having achieved remission: Secondary | ICD-10-CM

## 2015-04-06 DIAGNOSIS — D61818 Other pancytopenia: Secondary | ICD-10-CM

## 2015-04-06 DIAGNOSIS — D509 Iron deficiency anemia, unspecified: Secondary | ICD-10-CM | POA: Diagnosis not present

## 2015-04-06 DIAGNOSIS — D61810 Antineoplastic chemotherapy induced pancytopenia: Secondary | ICD-10-CM | POA: Diagnosis not present

## 2015-04-06 DIAGNOSIS — D696 Thrombocytopenia, unspecified: Secondary | ICD-10-CM | POA: Diagnosis not present

## 2015-04-06 DIAGNOSIS — T451X5A Adverse effect of antineoplastic and immunosuppressive drugs, initial encounter: Secondary | ICD-10-CM

## 2015-04-06 DIAGNOSIS — C9001 Multiple myeloma in remission: Secondary | ICD-10-CM

## 2015-04-06 DIAGNOSIS — D7589 Other specified diseases of blood and blood-forming organs: Secondary | ICD-10-CM | POA: Diagnosis not present

## 2015-04-06 DIAGNOSIS — D4620 Refractory anemia with excess of blasts, unspecified: Secondary | ICD-10-CM

## 2015-04-06 DIAGNOSIS — C946 Myelodysplastic disease, not classified: Secondary | ICD-10-CM

## 2015-04-06 LAB — COMPREHENSIVE METABOLIC PANEL (CC13)
ALT: 10 U/L (ref 0–55)
AST: 19 U/L (ref 5–34)
Albumin: 4 g/dL (ref 3.5–5.0)
Alkaline Phosphatase: 48 U/L (ref 40–150)
Anion Gap: 9 mEq/L (ref 3–11)
BUN: 16 mg/dL (ref 7.0–26.0)
CO2: 25 mEq/L (ref 22–29)
Calcium: 9.1 mg/dL (ref 8.4–10.4)
Chloride: 106 mEq/L (ref 98–109)
Creatinine: 0.8 mg/dL (ref 0.6–1.1)
EGFR: 71 mL/min/{1.73_m2} — ABNORMAL LOW (ref >90)
Glucose: 102 mg/dl (ref 70–140)
Potassium: 3.8 mEq/L (ref 3.5–5.1)
Sodium: 140 mEq/L (ref 136–145)
Total Bilirubin: 0.58 mg/dL (ref 0.20–1.20)
Total Protein: 7 g/dL (ref 6.4–8.3)

## 2015-04-06 LAB — CBC WITH DIFFERENTIAL/PLATELET
BASO%: 0.2 % (ref 0.0–2.0)
Basophils Absolute: 0 10*3/uL (ref 0.0–0.1)
EOS%: 0.4 % (ref 0.0–7.0)
Eosinophils Absolute: 0 10*3/uL (ref 0.0–0.5)
HCT: 24.5 % — ABNORMAL LOW (ref 34.8–46.6)
HGB: 7.5 g/dL — ABNORMAL LOW (ref 11.6–15.9)
LYMPH%: 22.2 % (ref 14.0–49.7)
MCH: 26.5 pg (ref 25.1–34.0)
MCHC: 30.6 g/dL — ABNORMAL LOW (ref 31.5–36.0)
MCV: 86.6 fL (ref 79.5–101.0)
MONO#: 1 10*3/uL — ABNORMAL HIGH (ref 0.1–0.9)
MONO%: 11.2 % (ref 0.0–14.0)
NEUT#: 6 10*3/uL (ref 1.5–6.5)
NEUT%: 66 % (ref 38.4–76.8)
Platelets: 61 10*3/uL — ABNORMAL LOW (ref 145–400)
RBC: 2.83 10*6/uL — ABNORMAL LOW (ref 3.70–5.45)
RDW: 18.2 % — ABNORMAL HIGH (ref 11.2–14.5)
WBC: 9.2 10*3/uL (ref 3.9–10.3)
lymph#: 2 10*3/uL (ref 0.9–3.3)
nRBC: 1 % — ABNORMAL HIGH (ref 0–0)

## 2015-04-06 LAB — HOLD TUBE, BLOOD BANK

## 2015-04-06 LAB — TECHNOLOGIST REVIEW: Technologist Review: 2

## 2015-04-06 LAB — PREPARE RBC (CROSSMATCH)

## 2015-04-06 LAB — BONE MARROW EXAM

## 2015-04-06 MED ORDER — DIPHENHYDRAMINE HCL 25 MG PO CAPS
ORAL_CAPSULE | ORAL | Status: AC
  Filled 2015-04-06: qty 1

## 2015-04-06 MED ORDER — ACETAMINOPHEN 325 MG PO TABS
650.0000 mg | ORAL_TABLET | Freq: Once | ORAL | Status: AC
  Administered 2015-04-06: 650 mg via ORAL

## 2015-04-06 MED ORDER — SODIUM CHLORIDE 0.9 % IV SOLN
250.0000 mL | Freq: Once | INTRAVENOUS | Status: AC
  Administered 2015-04-06: 250 mL via INTRAVENOUS

## 2015-04-06 MED ORDER — HEPARIN SOD (PORK) LOCK FLUSH 100 UNIT/ML IV SOLN
500.0000 [IU] | Freq: Every day | INTRAVENOUS | Status: AC | PRN
  Administered 2015-04-06: 500 [IU]
  Filled 2015-04-06: qty 5

## 2015-04-06 MED ORDER — DIPHENHYDRAMINE HCL 25 MG PO CAPS
25.0000 mg | ORAL_CAPSULE | Freq: Once | ORAL | Status: AC
  Administered 2015-04-06: 25 mg via ORAL

## 2015-04-06 MED ORDER — ACETAMINOPHEN 325 MG PO TABS
ORAL_TABLET | ORAL | Status: AC
  Filled 2015-04-06: qty 2

## 2015-04-06 MED ORDER — SODIUM CHLORIDE 0.9 % IJ SOLN
10.0000 mL | INTRAMUSCULAR | Status: AC | PRN
  Administered 2015-04-06: 10 mL
  Filled 2015-04-06: qty 10

## 2015-04-06 NOTE — Assessment & Plan Note (Signed)
We discussed some of the risks, benefits, and alternatives of blood transfusions. The patient is symptomatic from anemia and the hemoglobin level is critically low.  Some of the side-effects to be expected including risks of transfusion reactions, chills, infection, syndrome of volume overload and risk of hospitalization from various reasons and the patient is willing to proceed and went ahead to sign consent today.  I will stop treatment. I will pursue a bone marrow aspirate and biopsy as discussed.

## 2015-04-06 NOTE — Patient Instructions (Signed)

## 2015-04-06 NOTE — Assessment & Plan Note (Signed)
Unfortunately, she is developing pancytopenia with leukoerythroblastic blood picture. 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 I will stop her treatments today and proceed with bone marrow biopsy.

## 2015-04-06 NOTE — Telephone Encounter (Signed)
Per staff message and POF I have scheduled appts. Advised scheduler of appts. JMW  

## 2015-04-06 NOTE — Progress Notes (Signed)
[place] OFFICE PROGRESS NOTE  Patient Care Team: Jinny Sanders, MD as PCP - General (Family Medicine) [name], MD as Referring Physician (Hematology and Oncology) [name], MD as Consulting Physician (Internal Medicine) Truman Hayward, MD as Consulting Physician (Infectious Diseases)  SUMMARY OF ONCOLOGIC HISTORY: Oncology History   Multiple myeloma, Ig A Lambda, M spike 3.54 grams, Calcium 9.2, Creatinine 0.8, Beta 2 microglobulin 4.52, IgA 4840 mg/dL, lambda light chain 75.4, albumin 3.6, hemoglobin 9.7, platelet 115    Primary site: Multiple Myeloma   Staging method: AJCC 6th Edition   Clinical: Stage IIA signed by [name], MD on 11/07/2013  2:46 PM   Summary: Stage IIA        Multiple myeloma ([place])   10/31/2013 Bone Marrow Biopsy Bone marrow biopsy confirmed multiple myeloma with 40% bone marrow involvement. Skeletal survey showed minimal lesions in her score with generalized demineralization   11/10/2013 - 02/13/2014 Chemotherapy The patient is started on induction chemotherapy with weekly dexamethasone 40 mg by mouth as well as Velcade subcutaneous injection on days 1, 4, 8 and 11. On 11/21/2013, she was started on monthly Zometa.   12/23/2013 Adverse Reaction The dose of Velcade was reduced due to thrombocytopenia.   01/28/2014 - 04/07/2014 Chemotherapy Revlimid is added. Treatment was discontinued due to lack of response.   02/24/2014 - 04/07/2014 Chemotherapy Due to worsening peripheral neuropathy, Velcade injection is changed to once a week. Revlimid was given 21 days on, 7 days off.   04/07/2014 - 04/10/2014 Chemotherapy Revlimid was discontinued due to lack of response. Chemotherapy was changed back to Velcade injection twice a week, 2 weeks on 1 week off. Her treatment was switched to to minimum response   04/20/2014 - 06/02/2014 Chemotherapy chemotherapy is switched to Carfilzomib, Cytoxan and dexamethasone.   04/22/2014 Procedure she  has placement of port for chemotherapy.   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 [facility]   10/20/2014 - 03/24/2015 Chemotherapy  she received chemotherapy with Kyprolis, Revlimid and dexamethasone   10/22/2014 Procedure She has port placement   01/19/2015 Tumor Marker IgA lambda M spike at 0.4 g    01/20/2015 Miscellaneous 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  she complained of fatigue. She denies any recent chest pain but her shortness of breath on exertion.  she denies fevers or chills. She has occasional nosebleeds but denies hematuria or hematochezia.  REVIEW OF SYSTEMS:   Constitutional: Denies fevers, chills or abnormal weight loss Eyes: Denies blurriness of vision Ears, nose, mouth, throat, and face: Denies mucositis or sore throat Cardiovascular: Denies palpitation, chest discomfort or lower extremity swelling Gastrointestinal:  Denies nausea, heartburn or change in bowel habits Skin: Denies abnormal skin rashes Lymphatics: Denies new lymphadenopathy or easy bruising Neurological:Denies numbness, tingling or new weaknesses Behavioral/Psych: Mood is stable, no new changes  All  other systems were reviewed with the patient and are negative.  I have reviewed the past medical history, past surgical  history, social history and family history with the patient and they are unchanged from previous note.  ALLERGIES:  is allergic to penicillins and sulfa antibiotics.  MEDICATIONS:  Current Outpatient Prescriptions  Medication Sig Dispense Refill  . acetaminophen (TYLENOL) 500 MG tablet Take 1,500 mg by mouth as needed for moderate pain, fever or headache.     Marland Kitchen acyclovir (ZOVIRAX) 400 MG tablet Take 1 tablet (400 mg total) by mouth 2 (two) times daily. 180 tablet 3  . calcium carbonate (TUMS EX) 750 MG chewable tablet Chew 1 tablet by mouth 3 (three) times daily as needed for heartburn.     . carvedilol (COREG) 6.25 MG tablet Take 0.5 tablets (3.125 mg total) by mouth 2 (two) times daily with a meal. 90 tablet 3  . cholecalciferol (VITAMIN D) 1000 UNITS tablet Take 1,000 Units by mouth daily.    Marland Kitchen docusate sodium (COLACE) 100 MG capsule Take 100 mg by mouth 2 (two) times daily as needed for mild constipation or moderate constipation.     Marland Kitchen lenalidomide (REVLIMID) 15 MG capsule Take 1 capsule (15 mg total) by mouth daily. 21 capsule 0  . loperamide (IMODIUM A-D) 2 MG tablet Take 2 mg by mouth 4 (four) times daily as needed for diarrhea or loose stools.    . mirtazapine (REMERON) 30 MG tablet TAKE 1 TABLET BY MOUTH AT BEDTIME (Patient taking differently: TAKE 1/2 TABLET BY MOUTH AT BEDTIME) 60 tablet 1  . Multiple Vitamin (MULTIVITAMIN WITH MINERALS) TABS tablet Take 1 tablet by mouth daily.    . ondansetron (ZOFRAN) 8 MG tablet Take 1 tablet (8 mg total) by mouth 2 (two) times daily. Start the day after chemo for 2 days. Then as needed for nausea or vomiting. 30 tablet 1  . OXYQUINOLONE SULFATE VAGINAL (TRIMO-SAN) 0.025 % GEL Place 1 application vaginally 2 (two) times a week.     No current facility-administered medications for this visit.   Facility-Administered Medications Ordered in Other Visits  Medication Dose Route Frequency Provider Last Rate Last Dose  . heparin lock flush 100 unit/mL   500 Units Intracatheter Daily PRN [name], MD      . sodium chloride 0.9 % injection 10 mL  10 mL Intracatheter PRN [name], MD        PHYSICAL EXAMINATION: ECOG PERFORMANCE STATUS: 1 - Symptomatic but completely ambulatory  Filed Vitals:   04/06/15 0933  BP: 147/63  Pulse: 102  Temp: 97.5 F (36.4 C)  Resp: 21   Filed Weights   04/06/15 0933  Weight: 159 lb 14.4 oz (72.53 kg)    GENERAL:alert, no distress and comfortable SKIN: skin color is pale, texture, turgor are normal, no rashes or significant lesions EYES: normal, Conjunctiva are pale and non-injected, sclera clear OROPHARYNX:no exudate, no erythema and lips, buccal mucosa, and tongue normal  NECK: supple, thyroid normal size, non-tender, without nodularity LYMPH:  no palpable lymphadenopathy in the cervical, axillary or inguinal LUNGS: clear to auscultation and percussion with normal breathing effort HEART: regular rate & rhythm and no murmurs and no lower extremity edema ABDOMEN:abdomen soft, non-tender and normal bowel sounds Musculoskeletal:no cyanosis of digits and no clubbing  NEURO: alert & oriented x 3 with fluent speech, no focal motor/sensory deficits  LABORATORY DATA:  I have reviewed the data as listed    Component Value Date/Time   NA 140 04/06/2015  0919   NA 139 01/03/2015 2021   K 3.8 04/06/2015 0919   K 4.0 01/03/2015 2021   CL 109 01/03/2015 2021   CO2 25 04/06/2015 0919   CO2 25 01/03/2015 2021   GLUCOSE 102 04/06/2015 0919   GLUCOSE 115* 01/03/2015 2021   BUN 16.0 04/06/2015 0919   BUN 13 01/03/2015 2021   CREATININE 0.8 04/06/2015 0919   CREATININE 0.68 01/03/2015 2021   CALCIUM 9.1 04/06/2015 0919   CALCIUM 8.3* 01/03/2015 2021   PROT 7.0 04/06/2015 0919   PROT 6.5 01/03/2015 2021   ALBUMIN 4.0 04/06/2015 0919   ALBUMIN 4.1 01/03/2015 2021   AST 19 04/06/2015 0919   AST 18 01/03/2015 2021   ALT 10 04/06/2015 0919   ALT 11* 01/03/2015 2021   ALKPHOS 48 04/06/2015 0919    ALKPHOS 47 01/03/2015 2021   BILITOT 0.58 04/06/2015 0919   BILITOT 0.6 01/03/2015 2021   GFRNONAA >60 01/03/2015 2021   GFRAA >60 01/03/2015 2021    No results found for: SPEP, UPEP  Lab Results  Component Value Date   WBC 9.2 04/06/2015   NEUTROABS 6.0 04/06/2015   HGB 7.5* 04/06/2015   HCT 24.5* 04/06/2015   MCV 86.6 04/06/2015   PLT 61* 04/06/2015      Chemistry      Component Value Date/Time   NA 140 04/06/2015 0919   NA 139 01/03/2015 2021   K 3.8 04/06/2015 0919   K 4.0 01/03/2015 2021   CL 109 01/03/2015 2021   CO2 25 04/06/2015 0919   CO2 25 01/03/2015 2021   BUN 16.0 04/06/2015 0919   BUN 13 01/03/2015 2021   CREATININE 0.8 04/06/2015 0919   CREATININE 0.68 01/03/2015 2021      Component Value Date/Time   CALCIUM 9.1 04/06/2015 0919   CALCIUM 8.3* 01/03/2015 2021   ALKPHOS 48 04/06/2015 0919   ALKPHOS 47 01/03/2015 2021   AST 19 04/06/2015 0919   AST 18 01/03/2015 2021   ALT 10 04/06/2015 0919   ALT 11* 01/03/2015 2021   BILITOT 0.58 04/06/2015 0919   BILITOT 0.6 01/03/2015 2021     ASSESSMENT & PLAN:  Multiple myeloma  Unfortunately, she is developing pancytopenia with leukoerythroblastic blood picture. 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 I will stop her treatments today and proceed with bone marrow biopsy.  Pancytopenia due to antineoplastic chemotherapy [facility]) We discussed some of the risks, benefits, and alternatives of blood transfusions. The patient is symptomatic from anemia and the hemoglobin level is critically low.  Some of the side-effects to be expected including risks of transfusion reactions, chills, infection, syndrome of volume overload and risk of hospitalization from various reasons and the patient is willing to proceed and went ahead to sign consent today.  I will stop treatment. I will pursue a bone marrow aspirate and biopsy as discussed.  Thrombocytopenia ([employer]) This is likely due to recent treatment or related  to bone marrow disease. The patient denies recent history of bleeding such as epistaxis, hematuria or hematochezia. She is asymptomatic from the low platelet count. I will observe for now.  She does not need treatment/transfusion now.     Bone Marrow Biopsy and Aspiration Procedure Note   Informed consent was obtained and potential risks including bleeding, infection and pain were reviewed with the patient.  The patient's name, date of birth, identification, consent and allergies were verified prior to the start of procedure and time out was performed.  The left posterior iliac  crest was chosen as the site of biopsy.  The skin was prepped with Betadine solution.   8 cc of 1% lidocaine was used to provide local anaesthesia.   10 cc of bone marrow aspirate was obtained followed by 1 inch biopsy.   The procedure was tolerated well and there were no complications.  The patient was stable at the end of the procedure.  Specimens sent for flow cytometry, cytogenetics and additional studies.    All questions were answered. The patient knows to call the clinic with any problems, questions or concerns. No barriers to learning was detected. I spent 40 minutes counseling the patient face to face. The total time spent in the appointment was 60 minutes and more than 50% was on counseling and review of test results     [facility], [place], MD 04/06/2015 12:55 PM

## 2015-04-06 NOTE — Assessment & Plan Note (Signed)
This is likely due to recent treatment or related to bone marrow disease. The patient denies recent history of bleeding such as epistaxis, hematuria or hematochezia. She is asymptomatic from the low platelet count. I will observe for now.  She does not need treatment/transfusion now.

## 2015-04-06 NOTE — Progress Notes (Signed)
BMBx  Left hip performed by Dr. Alvy Bimler.  Pt tolerated well.  Procedure completed at 10:30 am.  Pt lying on back, pressure on Bx site.   VSS.

## 2015-04-07 ENCOUNTER — Ambulatory Visit: Payer: Medicare HMO

## 2015-04-07 ENCOUNTER — Encounter: Payer: Self-pay | Admitting: Hematology and Oncology

## 2015-04-07 LAB — TYPE AND SCREEN
ABO/RH(D): O POS
Antibody Screen: NEGATIVE
Unit division: 0

## 2015-04-07 NOTE — Telephone Encounter (Signed)
1040 am Triage received phone call from patient.  "I also messaged Dr. Alvy Bimler but haven't heard back.  MY nose bled after the bone Marrow biopsy yesterday but stopped.  I did not have a good night last night with nose bleeds off and on till 5:00 am.  I held my nose for five minutes and it stops.  No profuse bleeding.  I blow my nose, pass a clot and then a few drips of blood.  I was told not to lean forward but when I lean back, blood is in my throat.  I was told not to put ice on it so I'm concerned with this bleeding and my diagnosis should I be alarmed."  This nurse advised no blowing of nose and no bending over, leaning forward or leaning back.  Firm pressure to nose under bone should stop the bleeding BUT if not after ten minutes to go to the ED.  For these 'drips of blood', sit up straight leaning forward from the neck, holding pressure to nose five to ten minutes.

## 2015-04-08 ENCOUNTER — Other Ambulatory Visit: Payer: Self-pay | Admitting: Hematology and Oncology

## 2015-04-08 DIAGNOSIS — B079 Viral wart, unspecified: Secondary | ICD-10-CM | POA: Diagnosis not present

## 2015-04-08 DIAGNOSIS — Z808 Family history of malignant neoplasm of other organs or systems: Secondary | ICD-10-CM | POA: Diagnosis not present

## 2015-04-08 DIAGNOSIS — R231 Pallor: Secondary | ICD-10-CM | POA: Diagnosis not present

## 2015-04-08 DIAGNOSIS — D225 Melanocytic nevi of trunk: Secondary | ICD-10-CM | POA: Diagnosis not present

## 2015-04-08 DIAGNOSIS — D4620 Refractory anemia with excess of blasts, unspecified: Secondary | ICD-10-CM

## 2015-04-08 DIAGNOSIS — D469 Myelodysplastic syndrome, unspecified: Secondary | ICD-10-CM

## 2015-04-08 DIAGNOSIS — C946 Myelodysplastic disease, not classified: Secondary | ICD-10-CM

## 2015-04-08 DIAGNOSIS — Q825 Congenital non-neoplastic nevus: Secondary | ICD-10-CM | POA: Diagnosis not present

## 2015-04-08 DIAGNOSIS — L821 Other seborrheic keratosis: Secondary | ICD-10-CM | POA: Diagnosis not present

## 2015-04-13 ENCOUNTER — Ambulatory Visit (HOSPITAL_BASED_OUTPATIENT_CLINIC_OR_DEPARTMENT_OTHER): Payer: Medicare HMO

## 2015-04-13 ENCOUNTER — Telehealth: Payer: Self-pay | Admitting: Hematology and Oncology

## 2015-04-13 ENCOUNTER — Encounter: Payer: Self-pay | Admitting: Hematology and Oncology

## 2015-04-13 ENCOUNTER — Other Ambulatory Visit (HOSPITAL_BASED_OUTPATIENT_CLINIC_OR_DEPARTMENT_OTHER): Payer: Medicare HMO

## 2015-04-13 ENCOUNTER — Other Ambulatory Visit: Payer: Medicare HMO

## 2015-04-13 ENCOUNTER — Ambulatory Visit: Payer: Medicare HMO

## 2015-04-13 ENCOUNTER — Ambulatory Visit (HOSPITAL_BASED_OUTPATIENT_CLINIC_OR_DEPARTMENT_OTHER): Payer: Medicare HMO | Admitting: Hematology and Oncology

## 2015-04-13 VITALS — BP 140/58 | HR 75 | Temp 99.5°F | Resp 18

## 2015-04-13 VITALS — BP 149/63 | HR 93 | Temp 98.1°F | Resp 18 | Ht 63.0 in | Wt 159.0 lb

## 2015-04-13 DIAGNOSIS — Z23 Encounter for immunization: Secondary | ICD-10-CM | POA: Diagnosis not present

## 2015-04-13 DIAGNOSIS — C9001 Multiple myeloma in remission: Secondary | ICD-10-CM

## 2015-04-13 DIAGNOSIS — C9000 Multiple myeloma not having achieved remission: Secondary | ICD-10-CM

## 2015-04-13 DIAGNOSIS — D630 Anemia in neoplastic disease: Secondary | ICD-10-CM

## 2015-04-13 DIAGNOSIS — D4620 Refractory anemia with excess of blasts, unspecified: Secondary | ICD-10-CM

## 2015-04-13 DIAGNOSIS — D469 Myelodysplastic syndrome, unspecified: Secondary | ICD-10-CM

## 2015-04-13 DIAGNOSIS — D61810 Antineoplastic chemotherapy induced pancytopenia: Secondary | ICD-10-CM

## 2015-04-13 DIAGNOSIS — T451X5A Adverse effect of antineoplastic and immunosuppressive drugs, initial encounter: Secondary | ICD-10-CM

## 2015-04-13 DIAGNOSIS — D460 Refractory anemia without ring sideroblasts, so stated: Secondary | ICD-10-CM | POA: Diagnosis not present

## 2015-04-13 DIAGNOSIS — D61818 Other pancytopenia: Secondary | ICD-10-CM | POA: Diagnosis not present

## 2015-04-13 LAB — COMPREHENSIVE METABOLIC PANEL (CC13)
ALT: 10 U/L (ref 0–55)
AST: 22 U/L (ref 5–34)
Albumin: 3.9 g/dL (ref 3.5–5.0)
Alkaline Phosphatase: 45 U/L (ref 40–150)
Anion Gap: 9 mEq/L (ref 3–11)
BUN: 10.6 mg/dL (ref 7.0–26.0)
CO2: 26 mEq/L (ref 22–29)
Calcium: 9.1 mg/dL (ref 8.4–10.4)
Chloride: 107 mEq/L (ref 98–109)
Creatinine: 0.9 mg/dL (ref 0.6–1.1)
EGFR: 69 mL/min/{1.73_m2} — ABNORMAL LOW (ref >90)
Glucose: 105 mg/dl (ref 70–140)
Potassium: 3.9 mEq/L (ref 3.5–5.1)
Sodium: 142 mEq/L (ref 136–145)
Total Bilirubin: 0.62 mg/dL (ref 0.20–1.20)
Total Protein: 6.8 g/dL (ref 6.4–8.3)

## 2015-04-13 LAB — FERRITIN CHCC: Ferritin: 933 ng/ml — ABNORMAL HIGH (ref 9–269)

## 2015-04-13 LAB — CBC WITH DIFFERENTIAL/PLATELET
BASO%: 0.3 % (ref 0.0–2.0)
Basophils Absolute: 0 10*3/uL (ref 0.0–0.1)
EOS%: 0.5 % (ref 0.0–7.0)
Eosinophils Absolute: 0.1 10*3/uL (ref 0.0–0.5)
HCT: 24 % — ABNORMAL LOW (ref 34.8–46.6)
HGB: 7.3 g/dL — ABNORMAL LOW (ref 11.6–15.9)
LYMPH%: 20.5 % (ref 14.0–49.7)
MCH: 25.9 pg (ref 25.1–34.0)
MCHC: 30.4 g/dL — ABNORMAL LOW (ref 31.5–36.0)
MCV: 85.1 fL (ref 79.5–101.0)
MONO#: 1.5 10*3/uL — ABNORMAL HIGH (ref 0.1–0.9)
MONO%: 12.3 % (ref 0.0–14.0)
NEUT#: 7.8 10*3/uL — ABNORMAL HIGH (ref 1.5–6.5)
NEUT%: 66.4 % (ref 38.4–76.8)
Platelets: 93 10*3/uL — ABNORMAL LOW (ref 145–400)
RBC: 2.82 10*6/uL — ABNORMAL LOW (ref 3.70–5.45)
RDW: 18 % — ABNORMAL HIGH (ref 11.2–14.5)
WBC: 11.8 10*3/uL — ABNORMAL HIGH (ref 3.9–10.3)
lymph#: 2.4 10*3/uL (ref 0.9–3.3)
nRBC: 1 % — ABNORMAL HIGH (ref 0–0)

## 2015-04-13 LAB — IRON AND TIBC CHCC
%SAT: >100 % (ref 21–?)
Iron: 249 ug/dL — ABNORMAL HIGH (ref 41–142)
TIBC: 226 ug/dL — ABNORMAL LOW (ref 236–444)
UIBC: <1 ug/dL (ref 120–384)

## 2015-04-13 LAB — TECHNOLOGIST REVIEW

## 2015-04-13 LAB — HOLD TUBE, BLOOD BANK

## 2015-04-13 MED ORDER — INFLUENZA VAC SPLIT QUAD 0.5 ML IM SUSY
0.5000 mL | PREFILLED_SYRINGE | Freq: Once | INTRAMUSCULAR | Status: AC
  Administered 2015-04-13: 0.5 mL via INTRAMUSCULAR
  Filled 2015-04-13: qty 0.5

## 2015-04-13 MED ORDER — SODIUM CHLORIDE 0.9 % IJ SOLN
10.0000 mL | INTRAMUSCULAR | Status: AC | PRN
  Administered 2015-04-13: 10 mL
  Filled 2015-04-13: qty 10

## 2015-04-13 MED ORDER — ACETAMINOPHEN 325 MG PO TABS
ORAL_TABLET | ORAL | Status: AC
  Filled 2015-04-13: qty 2

## 2015-04-13 MED ORDER — DIPHENHYDRAMINE HCL 25 MG PO CAPS
ORAL_CAPSULE | ORAL | Status: AC
  Filled 2015-04-13: qty 1

## 2015-04-13 MED ORDER — HEPARIN SOD (PORK) LOCK FLUSH 100 UNIT/ML IV SOLN
500.0000 [IU] | Freq: Every day | INTRAVENOUS | Status: AC | PRN
  Administered 2015-04-13: 500 [IU]
  Filled 2015-04-13: qty 5

## 2015-04-13 MED ORDER — SODIUM CHLORIDE 0.9 % IV SOLN
250.0000 mL | Freq: Once | INTRAVENOUS | Status: AC
  Administered 2015-04-13: 250 mL via INTRAVENOUS

## 2015-04-13 MED ORDER — ACETAMINOPHEN 325 MG PO TABS
650.0000 mg | ORAL_TABLET | Freq: Once | ORAL | Status: AC
  Administered 2015-04-13: 650 mg via ORAL

## 2015-04-13 MED ORDER — DIPHENHYDRAMINE HCL 25 MG PO CAPS
25.0000 mg | ORAL_CAPSULE | Freq: Once | ORAL | Status: AC
  Administered 2015-04-13: 25 mg via ORAL

## 2015-04-13 NOTE — Assessment & Plan Note (Signed)
Repeat bone marrow biopsy show only one percent residual plasma cells. I will discontinue all her treatment in view of recent diagnosis of myelodysplastic syndrome.

## 2015-04-13 NOTE — Assessment & Plan Note (Signed)
Cytogenetics are pending. According to her most recent cytogenetics report, the calculated revised international prognostic score would put her at the intermittent 2 category.  I have ordered additional workup to see if she would benefit from erythropoietin stimulating agents. I recommend second opinion at [facility] with the specialist that manages myelodysplastic syndrome to see what other treatment options or plan of care.  in the meantime, the patient will return here every 2 weeks for transfusion support

## 2015-04-13 NOTE — Telephone Encounter (Signed)
Gave adn printed appt sched and avs for pt for NOV and DEC °

## 2015-04-13 NOTE — Patient Instructions (Signed)

## 2015-04-13 NOTE — Progress Notes (Signed)
[place] OFFICE PROGRESS NOTE  Patient Care Team: Jinny Sanders, MD as PCP - General (Family Medicine) [name], MD as Referring Physician (Hematology and Oncology) [name], MD as Consulting Physician (Internal Medicine) Truman Hayward, MD as Consulting Physician (Infectious Diseases)  SUMMARY OF ONCOLOGIC HISTORY: Oncology History   Multiple myeloma, Ig A Lambda, M spike 3.54 grams, Calcium 9.2, Creatinine 0.8, Beta 2 microglobulin 4.52, IgA 4840 mg/dL, lambda light chain 75.4, albumin 3.6, hemoglobin 9.7, platelet 115    Primary site: Multiple Myeloma   Staging method: AJCC 6th Edition   Clinical: Stage IIA signed by [name], MD on 11/07/2013  2:46 PM   Summary: Stage IIA        Multiple myeloma ([place])   10/31/2013 Bone Marrow Biopsy Bone marrow biopsy confirmed multiple myeloma with 40% bone marrow involvement. Skeletal survey showed minimal lesions in her score with generalized demineralization   11/10/2013 - 02/13/2014 Chemotherapy The patient is started on induction chemotherapy with weekly dexamethasone 40 mg by mouth as well as Velcade subcutaneous injection on days 1, 4, 8 and 11. On 11/21/2013, she was started on monthly Zometa.   12/23/2013 Adverse Reaction The dose of Velcade was reduced due to thrombocytopenia.   01/28/2014 - 04/07/2014 Chemotherapy Revlimid is added. Treatment was discontinued due to lack of response.   02/24/2014 - 04/07/2014 Chemotherapy Due to worsening peripheral neuropathy, Velcade injection is changed to once a week. Revlimid was given 21 days on, 7 days off.   04/07/2014 - 04/10/2014 Chemotherapy Revlimid was discontinued due to lack of response. Chemotherapy was changed back to Velcade injection twice a week, 2 weeks on 1 week off. Her treatment was switched to to minimum response   04/20/2014 - 06/02/2014 Chemotherapy chemotherapy is switched to Carfilzomib, Cytoxan and dexamethasone.   04/22/2014 Procedure she  has placement of port for chemotherapy.   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 [facility]   10/20/2014 - 03/24/2015 Chemotherapy  she received chemotherapy with Kyprolis, Revlimid and dexamethasone   10/22/2014 Procedure She has port placement   01/19/2015 Tumor Marker IgA lambda M spike at 0.4 g    01/20/2015 Miscellaneous 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  she returns for further follow-up. She complained of profound fatigue. She has no further bleeding since last week.  REVIEW OF SYSTEMS:   Constitutional: Denies fevers, chills or abnormal weight loss Eyes: Denies blurriness of vision Ears, nose, mouth, throat, and face: Denies mucositis or sore throat Cardiovascular: Denies palpitation, chest discomfort or lower extremity swelling Gastrointestinal:  Denies nausea, heartburn or change in bowel habits Skin: Denies abnormal skin rashes Lymphatics: Denies new lymphadenopathy or easy bruising Neurological:Denies numbness, tingling or new weaknesses Behavioral/Psych: Mood is  stable, no new changes  All other systems were reviewed with the patient and are negative.  I have reviewed  the past medical history, past surgical history, social history and family history with the patient and they are unchanged from previous note.  ALLERGIES:  is allergic to penicillins and sulfa antibiotics.  MEDICATIONS:  Current Outpatient Prescriptions  Medication Sig Dispense Refill  . acetaminophen (TYLENOL) 500 MG tablet Take 1,500 mg by mouth as needed for moderate pain, fever or headache.     Marland Kitchen acyclovir (ZOVIRAX) 400 MG tablet Take 1 tablet (400 mg total) by mouth 2 (two) times daily. 180 tablet 3  . calcium carbonate (TUMS EX) 750 MG chewable tablet Chew 1 tablet by mouth 3 (three) times daily as needed for heartburn.     . carvedilol (COREG) 6.25 MG tablet Take 0.5 tablets (3.125 mg total) by mouth 2 (two) times daily with a meal. 90 tablet 3  . cholecalciferol (VITAMIN D) 1000 UNITS tablet Take 1,000 Units by mouth daily.    Marland Kitchen docusate sodium (COLACE) 100 MG capsule Take 100 mg by mouth 2 (two) times daily as needed for mild constipation or moderate constipation.     Marland Kitchen lenalidomide (REVLIMID) 15 MG capsule Take 1 capsule (15 mg total) by mouth daily. 21 capsule 0  . loperamide (IMODIUM A-D) 2 MG tablet Take 2 mg by mouth 4 (four) times daily as needed for diarrhea or loose stools.    . mirtazapine (REMERON) 30 MG tablet TAKE 1 TABLET BY MOUTH AT BEDTIME (Patient taking differently: TAKE 1/2 TABLET BY MOUTH AT BEDTIME) 60 tablet 1  . Multiple Vitamin (MULTIVITAMIN WITH MINERALS) TABS tablet Take 1 tablet by mouth daily.    Levin Erp SULFATE VAGINAL (TRIMO-SAN) 0.025 % GEL Place 1 application vaginally 2 (two) times a week.     No current facility-administered medications for this visit.    PHYSICAL EXAMINATION: ECOG PERFORMANCE STATUS: 1 - Symptomatic but completely ambulatory  Filed Vitals:   04/13/15 1025  BP: 149/63  Pulse: 93  Temp: 98.1 F (36.7 C)  Resp: 18   Filed Weights   04/13/15 1025  Weight: 159 lb (72.122 kg)    GENERAL:alert, no distress and  comfortable SKIN: skin color is pale, texture, turgor are normal, no rashes or significant lesions EYES: normal, Conjunctiva are pale and non-injected, sclera clear Musculoskeletal:no cyanosis of digits and no clubbing  NEURO: alert & oriented x 3 with fluent speech, no focal motor/sensory deficits  LABORATORY DATA:  I have reviewed the data as listed    Component Value Date/Time   NA 142 04/13/2015 0952   NA 139 01/03/2015 2021   K 3.9 04/13/2015 0952   K 4.0 01/03/2015 2021   CL 109 01/03/2015 2021   CO2 26 04/13/2015 0952   CO2 25 01/03/2015 2021   GLUCOSE 105 04/13/2015 0952   GLUCOSE 115* 01/03/2015 2021   BUN 10.6 04/13/2015 0952   BUN 13 01/03/2015 2021   CREATININE 0.9 04/13/2015 0952   CREATININE 0.68 01/03/2015 2021   CALCIUM 9.1 04/13/2015 0952   CALCIUM 8.3* 01/03/2015 2021   PROT 6.8 04/13/2015 0952   PROT 6.5 01/03/2015 2021   ALBUMIN 3.9 04/13/2015 0952   ALBUMIN 4.1 01/03/2015 2021   AST 22 04/13/2015 0952   AST 18 01/03/2015 2021   ALT 10 04/13/2015 0952   ALT 11* 01/03/2015 2021   ALKPHOS 45 04/13/2015 0952   ALKPHOS 47 01/03/2015 2021   BILITOT 0.62 04/13/2015 9563  BILITOT 0.6 01/03/2015 2021   GFRNONAA >60 01/03/2015 2021   GFRAA >60 01/03/2015 2021    No results found for: SPEP, UPEP  Lab Results  Component Value Date   WBC 11.8* 04/13/2015   NEUTROABS 7.8* 04/13/2015   HGB 7.3* 04/13/2015   HCT 24.0* 04/13/2015   MCV 85.1 04/13/2015   PLT 93* 04/13/2015      Chemistry      Component Value Date/Time   NA 142 04/13/2015 0952   NA 139 01/03/2015 2021   K 3.9 04/13/2015 0952   K 4.0 01/03/2015 2021   CL 109 01/03/2015 2021   CO2 26 04/13/2015 0952   CO2 25 01/03/2015 2021   BUN 10.6 04/13/2015 0952   BUN 13 01/03/2015 2021   CREATININE 0.9 04/13/2015 0952   CREATININE 0.68 01/03/2015 2021      Component Value Date/Time   CALCIUM 9.1 04/13/2015 0952   CALCIUM 8.3* 01/03/2015 2021   ALKPHOS 45 04/13/2015 0952   ALKPHOS 47  01/03/2015 2021   AST 22 04/13/2015 0952   AST 18 01/03/2015 2021   ALT 10 04/13/2015 0952   ALT 11* 01/03/2015 2021   BILITOT 0.62 04/13/2015 0952   BILITOT 0.6 01/03/2015 2021      ASSESSMENT & PLAN:  Multiple myeloma  Repeat bone marrow biopsy show only one percent residual plasma cells. I will discontinue all her treatment in view of recent diagnosis of myelodysplastic syndrome.  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 According to her most recent cytogenetics report, the calculated revised international prognostic score would put her at the intermittent 2 category.  I have ordered additional workup to see if she would benefit from erythropoietin stimulating agents. I recommend second opinion at [facility] with the specialist that manages myelodysplastic syndrome to see what other treatment options or plan of care.  in the meantime, the patient will return here every 2 weeks for transfusion support  Pancytopenia due to antineoplastic chemotherapy [facility]) We discussed some of the risks, benefits, and alternatives of blood transfusions. The patient is symptomatic from anemia and the hemoglobin level is critically low.  Some of the side-effects to be expected including risks of transfusion reactions, chills, infection, syndrome of volume overload and risk of hospitalization from various reasons and the patient is willing to proceed and went ahead to sign consent today. She will get 2 units of blood whenever her hemoglobin is less than 8 g. Last week, I only give her 1 unit of blood and she continues to have significant fatigue. She does not require platelet transfusion now.   No orders of the defined types were placed in this encounter.   All questions were answered. The patient knows to call the clinic with any problems, questions or concerns. No barriers to learning was detected. I spent 25 minutes counseling the patient face to face. The total time spent  in the appointment was 40 minutes and more than 50% was on counseling and review of test results     [facility], [place], MD 04/13/2015 5:17 PM

## 2015-04-13 NOTE — Assessment & Plan Note (Signed)
We discussed some of the risks, benefits, and alternatives of blood transfusions. The patient is symptomatic from anemia and the hemoglobin level is critically low.  Some of the side-effects to be expected including risks of transfusion reactions, chills, infection, syndrome of volume overload and risk of hospitalization from various reasons and the patient is willing to proceed and went ahead to sign consent today. She will get 2 units of blood whenever her hemoglobin is less than 8 g. Last week, I only give her 1 unit of blood and she continues to have significant fatigue. She does not require platelet transfusion now.

## 2015-04-14 ENCOUNTER — Ambulatory Visit: Payer: Medicare HMO

## 2015-04-14 ENCOUNTER — Encounter: Payer: Self-pay | Admitting: Hematology and Oncology

## 2015-04-14 LAB — TYPE AND SCREEN
ABO/RH(D): O POS
Antibody Screen: NEGATIVE
Unit division: 0
Unit division: 0

## 2015-04-15 ENCOUNTER — Encounter: Payer: Self-pay | Admitting: Hematology and Oncology

## 2015-04-15 LAB — VITAMIN B12: Vitamin B-12: 1316 pg/mL — ABNORMAL HIGH (ref 211–911)

## 2015-04-15 LAB — ERYTHROPOIETIN: Erythropoietin: 197.7 m[IU]/mL — ABNORMAL HIGH (ref 2.6–18.5)

## 2015-04-16 LAB — CHROMOSOME ANALYSIS, BONE MARROW

## 2015-04-16 LAB — TISSUE HYBRIDIZATION (BONE MARROW)-NCBH

## 2015-04-19 ENCOUNTER — Telehealth: Payer: Self-pay | Admitting: Hematology and Oncology

## 2015-04-19 ENCOUNTER — Telehealth: Payer: Self-pay | Admitting: *Deleted

## 2015-04-19 ENCOUNTER — Encounter: Payer: Self-pay | Admitting: Hematology and Oncology

## 2015-04-19 DIAGNOSIS — Z9484 Stem cells transplant status: Secondary | ICD-10-CM | POA: Diagnosis not present

## 2015-04-19 DIAGNOSIS — C9000 Multiple myeloma not having achieved remission: Secondary | ICD-10-CM | POA: Diagnosis not present

## 2015-04-19 DIAGNOSIS — D469 Myelodysplastic syndrome, unspecified: Secondary | ICD-10-CM | POA: Diagnosis not present

## 2015-04-19 DIAGNOSIS — D759 Disease of blood and blood-forming organs, unspecified: Secondary | ICD-10-CM | POA: Diagnosis not present

## 2015-04-19 NOTE — Telephone Encounter (Signed)
pt called  to sched appt...done....pt ok and aware of new d.t °

## 2015-04-19 NOTE — Telephone Encounter (Signed)
Faxed [employer] report to Dr. Nila Nephew at [facility].   Called and s/w Caryl Pina, she took report off of Fax machine and said she will put on Dr. Ara Kussmaul desk for his review.

## 2015-04-20 ENCOUNTER — Other Ambulatory Visit: Payer: Medicare HMO

## 2015-04-20 ENCOUNTER — Ambulatory Visit: Payer: Medicare HMO

## 2015-04-20 ENCOUNTER — Encounter: Payer: Self-pay | Admitting: Hematology and Oncology

## 2015-04-20 ENCOUNTER — Other Ambulatory Visit: Payer: Self-pay | Admitting: Hematology and Oncology

## 2015-04-21 ENCOUNTER — Ambulatory Visit: Payer: Medicare HMO

## 2015-04-21 ENCOUNTER — Encounter: Payer: Self-pay | Admitting: Hematology and Oncology

## 2015-04-22 ENCOUNTER — Encounter: Payer: Self-pay | Admitting: Hematology and Oncology

## 2015-04-22 DIAGNOSIS — N8111 Cystocele, midline: Secondary | ICD-10-CM | POA: Diagnosis not present

## 2015-04-22 DIAGNOSIS — N816 Rectocele: Secondary | ICD-10-CM | POA: Diagnosis not present

## 2015-04-23 ENCOUNTER — Telehealth: Payer: Self-pay | Admitting: *Deleted

## 2015-04-23 NOTE — Telephone Encounter (Signed)
Call from patient asking for return call to [phone] in reference to flush with lab draw next week and she may not ned blood transfusion.

## 2015-04-23 NOTE — Telephone Encounter (Signed)
Pt states would rather have labs drawn from her arm on Monday and not from her PAC.    That way if she does not need a transfusion, she can leave w/o waiting for a nurse to de-access her.   If she does need a transfusion, then the Infusion nurse can access her in the Infusion room.  Informed pt this is perfectly ok and canceled the Flush appt for Monday.

## 2015-04-26 ENCOUNTER — Encounter: Payer: Self-pay | Admitting: Hematology and Oncology

## 2015-04-27 ENCOUNTER — Ambulatory Visit (HOSPITAL_COMMUNITY)
Admission: RE | Admit: 2015-04-27 | Discharge: 2015-04-27 | Disposition: A | Discharge status: Home / Self Care | Payer: Medicare HMO | Source: Ambulatory Visit | Attending: Hematology and Oncology | Admitting: Hematology and Oncology

## 2015-04-27 ENCOUNTER — Other Ambulatory Visit (HOSPITAL_BASED_OUTPATIENT_CLINIC_OR_DEPARTMENT_OTHER): Payer: Medicare HMO

## 2015-04-27 ENCOUNTER — Ambulatory Visit: Payer: Medicare HMO

## 2015-04-27 DIAGNOSIS — C9000 Multiple myeloma not having achieved remission: Secondary | ICD-10-CM | POA: Diagnosis not present

## 2015-04-27 DIAGNOSIS — D4620 Refractory anemia with excess of blasts, unspecified: Secondary | ICD-10-CM

## 2015-04-27 DIAGNOSIS — D469 Myelodysplastic syndrome, unspecified: Secondary | ICD-10-CM

## 2015-04-27 LAB — CBC WITH DIFFERENTIAL/PLATELET
BASO%: 0.9 % (ref 0.0–2.0)
Basophils Absolute: 0.2 10*3/uL — ABNORMAL HIGH (ref 0.0–0.1)
EOS%: 0.6 % (ref 0.0–7.0)
Eosinophils Absolute: 0.1 10*3/uL (ref 0.0–0.5)
HCT: 28.8 % — ABNORMAL LOW (ref 34.8–46.6)
HGB: 9.2 g/dL — ABNORMAL LOW (ref 11.6–15.9)
LYMPH%: 13.2 % — ABNORMAL LOW (ref 14.0–49.7)
MCH: 26.2 pg (ref 25.1–34.0)
MCHC: 31.8 g/dL (ref 31.5–36.0)
MCV: 82.4 fL (ref 79.5–101.0)
MONO#: 1.5 10*3/uL — ABNORMAL HIGH (ref 0.1–0.9)
MONO%: 7.6 % (ref 0.0–14.0)
NEUT#: 15.3 10*3/uL — ABNORMAL HIGH (ref 1.5–6.5)
NEUT%: 77.7 % — ABNORMAL HIGH (ref 38.4–76.8)
Platelets: 91 10*3/uL — ABNORMAL LOW (ref 145–400)
RBC: 3.49 10*6/uL — ABNORMAL LOW (ref 3.70–5.45)
RDW: 17.5 % — ABNORMAL HIGH (ref 11.2–14.5)
WBC: 19.7 10*3/uL — ABNORMAL HIGH (ref 3.9–10.3)
lymph#: 2.6 10*3/uL (ref 0.9–3.3)

## 2015-04-27 LAB — COMPREHENSIVE METABOLIC PANEL (CC13)
ALT: 11 U/L (ref 0–55)
AST: 23 U/L (ref 5–34)
Albumin: 4 g/dL (ref 3.5–5.0)
Alkaline Phosphatase: 50 U/L (ref 40–150)
Anion Gap: 8 mEq/L (ref 3–11)
BUN: 11.7 mg/dL (ref 7.0–26.0)
CO2: 26 mEq/L (ref 22–29)
Calcium: 9.1 mg/dL (ref 8.4–10.4)
Chloride: 105 mEq/L (ref 98–109)
Creatinine: 0.9 mg/dL (ref 0.6–1.1)
EGFR: 66 mL/min/{1.73_m2} — ABNORMAL LOW (ref >90)
Glucose: 115 mg/dl (ref 70–140)
Potassium: 3.9 mEq/L (ref 3.5–5.1)
Sodium: 140 mEq/L (ref 136–145)
Total Bilirubin: 0.48 mg/dL (ref 0.20–1.20)
Total Protein: 6.9 g/dL (ref 6.4–8.3)

## 2015-04-27 LAB — TECHNOLOGIST REVIEW

## 2015-04-27 LAB — HOLD TUBE, BLOOD BANK

## 2015-04-27 NOTE — Progress Notes (Signed)
Per Dr. Calton Dach office note on 04/13/15 pt is to only receive 2 units when hemoglobin is less than 8. Hemoglobin today was 9.2, consulted with Tammi, RN (Dr. Calton Dach nurse) pt is to not get blood today based on Parameters. Pt reports pain in mostly right leg but sometimes left leg upon waking in the morning and lasts for appox 10 seconds, She states that Dr. Alvy Bimler has been made aware but has since worsened and she is to see Dr. Alvy Bimler on Friday and may consult with her vascular MD before then. Tammi RN aware whom will inform Dr. Alvy Bimler. Pt in stable condition at time of discharge.

## 2015-04-30 ENCOUNTER — Telehealth: Payer: Self-pay | Admitting: Hematology and Oncology

## 2015-04-30 ENCOUNTER — Encounter: Payer: Self-pay | Admitting: General Practice

## 2015-04-30 ENCOUNTER — Ambulatory Visit (HOSPITAL_BASED_OUTPATIENT_CLINIC_OR_DEPARTMENT_OTHER): Payer: Medicare HMO | Admitting: Hematology and Oncology

## 2015-04-30 DIAGNOSIS — C9001 Multiple myeloma in remission: Secondary | ICD-10-CM

## 2015-04-30 DIAGNOSIS — D61818 Other pancytopenia: Secondary | ICD-10-CM | POA: Diagnosis not present

## 2015-04-30 DIAGNOSIS — D469 Myelodysplastic syndrome, unspecified: Secondary | ICD-10-CM | POA: Diagnosis not present

## 2015-04-30 DIAGNOSIS — R634 Abnormal weight loss: Secondary | ICD-10-CM

## 2015-04-30 MED ORDER — MIRTAZAPINE 30 MG PO TABS: 30.0000 mg | ORAL_TABLET | Freq: Every day | ORAL | Status: DC

## 2015-04-30 NOTE — Telephone Encounter (Signed)
per pof to sch pt appt-gave pt copy of avs °

## 2015-04-30 NOTE — Progress Notes (Signed)
Spiritual Care Note  Following Jean Davidson for support via Multiple Myeloma group.  Met with her briefly following appointment with Dr Alvy Bimler, because pt had reached out to me via email.  She was visibly shaken by updates and risk of tx plan; she did not wish to speak in detail at this time, but accepted suggestion that she and her friend Rod Holler, who accompanied her to appointment, visit [facility] for centering/debriefing/calming prior to driving home.  Encouraged self-care and provided brief pastoral presence and assurance of ongoing chaplain availability via support group, 1:1 appointment, email, or phone, as pt desires.  She verbalized awareness and gratitude.  Please also page as needs arise.  Thank you.  [place], [place], [facility] Pager [phone] Voicemail [phone]

## 2015-04-30 NOTE — Progress Notes (Signed)
Spiritual Care Addendum  Received thank you email from pt this afternoon, naming some of her deliberations/considerations.    Correction:  Her close friend who accompanied her today is named Ruby (not Rod Holler).  [place], [place], [facility] Pager [phone] Voicemail  [phone]

## 2015-05-01 NOTE — Assessment & Plan Note (Addendum)
We have a very extensive discussion today Her calculated R-IPSS score here would put her in intermediate risk group but the higher blast count from bone marrow biopsy at [facility] would put her at high risk We discussed prognosis. Treatment related MDS may confer to worse prognosis We discussed the pros and cons of allogenic stem cell transplant I recommend second opinion at [facility] and made the referral for her If she declines allogenic stem cell transplant, I can offer her palliative chemotherapy with [facility] I recommend she contacts the bone marrow transplant coordinator at [facility] to initiate the process of testing her brother to determine eligibility as her donor

## 2015-05-01 NOTE — Assessment & Plan Note (Signed)
This is due to MDS The patient denies recent history of bleeding such as epistaxis, hematuria or hematochezia. She is asymptomatic from the anemia. We will observe for now.  She does not require transfusion now.  We discussed the utility of ESA For now, she will return every 2 weeks to get blood checked and transfuse prn We will give her 2 unites of blood whenever hemoglobin is less than 8 grams She became symptomatic at that level and 1 unit of blood was not adequate to alleviate her symptoms of fatigue

## 2015-05-01 NOTE — Assessment & Plan Note (Signed)
Her last bone marrow detected 1% plasma cells She is not symptomatic Observe only for now

## 2015-05-01 NOTE — Progress Notes (Signed)
[facility] OFFICE PROGRESS NOTE  Patient Care Team: Jinny Sanders, MD as PCP - General (Family Medicine) [name], MD as Referring Physician (Hematology and Oncology) [name], MD as Consulting Physician (Internal Medicine) Truman Hayward, MD as Consulting Physician (Infectious Diseases) Deanne Coffer An Nila Nephew, MD as Consulting Physician (Hematology and Oncology)  SUMMARY OF ONCOLOGIC HISTORY: Oncology History   Multiple myeloma, Ig A Lambda, M spike 3.54 grams, Calcium 9.2, Creatinine 0.8, Beta 2 microglobulin 4.52, IgA 4840 mg/dL, lambda light chain 75.4, albumin 3.6, hemoglobin 9.7, platelet 115    Primary site: Multiple Myeloma   Staging method: AJCC 6th Edition   Clinical: Stage IIA signed by [name], MD on 11/07/2013  2:46 PM   Summary: Stage IIA        Multiple myeloma ([place])   10/31/2013 Bone Marrow Biopsy Bone marrow biopsy confirmed multiple myeloma with 40% bone marrow involvement. Skeletal survey showed minimal lesions in her score with generalized demineralization   11/10/2013 - 02/13/2014 Chemotherapy The patient is started on induction chemotherapy with weekly dexamethasone 40 mg by mouth as well as Velcade subcutaneous injection on days 1, 4, 8 and 11. On 11/21/2013, she was started on monthly Zometa.   12/23/2013 Adverse Reaction The dose of Velcade was reduced due to thrombocytopenia.   01/28/2014 - 04/07/2014 Chemotherapy Revlimid is added. Treatment was discontinued due to lack of response.   02/24/2014 - 04/07/2014 Chemotherapy Due to worsening peripheral neuropathy, Velcade injection is changed to once a week. Revlimid was given 21 days on, 7 days off.   04/07/2014 - 04/10/2014 Chemotherapy Revlimid was discontinued due to lack of response. Chemotherapy was changed back to Velcade injection twice a week, 2 weeks on 1 week off. Her treatment was switched to to minimum response   04/20/2014 - 06/02/2014 Chemotherapy chemotherapy is  switched to Carfilzomib, Cytoxan and dexamethasone.   04/22/2014 Procedure she has placement of port for chemotherapy.   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 [facility]   10/20/2014 - 03/24/2015 Chemotherapy  she received chemotherapy with Kyprolis, Revlimid and dexamethasone   10/22/2014 Procedure She has port placement   01/19/2015 Tumor Marker IgA lambda M spike at 0.4 g    01/20/2015 Miscellaneous IVIG 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 She returns for follow-up The patient denies any recent signs or symptoms of bleeding such as spontaneous epistaxis, hematuria or hematochezia.   REVIEW OF SYSTEMS:   Constitutional: Denies fevers, chills or abnormal weight loss Eyes: Denies blurriness of vision Ears, nose, mouth, throat, and face: Denies mucositis or sore throat Respiratory: Denies cough, dyspnea or wheezes Cardiovascular: Denies palpitation, chest discomfort or  lower extremity swelling Gastrointestinal:  Denies nausea, heartburn or change in bowel habits Skin: Denies abnormal skin rashes Lymphatics:  Denies new lymphadenopathy or easy bruising Neurological:Denies numbness, tingling or new weaknesses Behavioral/Psych: Mood is stable, no new changes  All other systems were reviewed with the patient and are negative.  I have reviewed the past medical history, past surgical history, social history and family history with the patient and they are unchanged from previous note.  ALLERGIES:  is allergic to penicillins and sulfa antibiotics.  MEDICATIONS:  Current Outpatient Prescriptions  Medication Sig Dispense Refill  . acetaminophen (TYLENOL) 500 MG tablet Take 1,500 mg by mouth as needed for moderate pain, fever or headache.     Marland Kitchen acyclovir (ZOVIRAX) 400 MG tablet Take 1 tablet (400 mg total) by mouth 2 (two) times daily. 180 tablet 3  . calcium carbonate (TUMS EX) 750 MG chewable tablet Chew 1 tablet by mouth 3 (three) times daily as needed for heartburn.     . carvedilol (COREG) 6.25 MG tablet Take 0.5 tablets (3.125 mg total) by mouth 2 (two) times daily with a meal. 90 tablet 3  . cholecalciferol (VITAMIN D) 1000 UNITS tablet Take 1,000 Units by mouth daily.    Marland Kitchen docusate sodium (COLACE) 100 MG capsule Take 100 mg by mouth 2 (two) times daily as needed for mild constipation or moderate constipation.     . mirtazapine (REMERON) 30 MG tablet Take 1 tablet (30 mg total) by mouth at bedtime. 90 tablet 3  . Multiple Vitamin (MULTIVITAMIN WITH MINERALS) TABS tablet Take 1 tablet by mouth daily.    Levin Erp SULFATE VAGINAL (TRIMO-SAN) 0.025 % GEL Place 1 application vaginally 2 (two) times a week.     No current facility-administered medications for this visit.    PHYSICAL EXAMINATION: ECOG PERFORMANCE STATUS: 1 - Symptomatic but completely ambulatory  Filed Vitals:   04/30/15 1146  BP: 149/88  Pulse: 99  Temp: 97.8 F (36.6 C)  Resp: 18   Filed Weights   04/30/15 1146  Weight: 158 lb 3.2 oz (71.759 kg)    GENERAL:alert, no distress and comfortable SKIN: skin color is  pale, texture, turgor are normal, no rashes or significant lesions EYES: normal, Conjunctiva are pink and non-injected, sclera clear Musculoskeletal:no cyanosis of digits and no clubbing  NEURO: alert & oriented x 3 with fluent speech, no focal motor/sensory deficits  LABORATORY DATA:  I have reviewed the data as listed    Component Value Date/Time   NA 140 04/27/2015 0825   NA 139 01/03/2015 2021   K 3.9 04/27/2015 0825   K 4.0 01/03/2015 2021   CL 109 01/03/2015 2021   CO2 26 04/27/2015 0825   CO2 25 01/03/2015 2021   GLUCOSE 115 04/27/2015 0825   GLUCOSE 115* 01/03/2015 2021   BUN 11.7 04/27/2015 0825   BUN 13 01/03/2015 2021   CREATININE 0.9 04/27/2015 0825   CREATININE 0.68 01/03/2015 2021   CALCIUM 9.1 04/27/2015 0825   CALCIUM 8.3* 01/03/2015 2021   PROT 6.9 04/27/2015 0825   PROT 6.5 01/03/2015 2021   ALBUMIN 4.0 04/27/2015 0825   ALBUMIN 4.1 01/03/2015 2021   AST 23 04/27/2015 0825   AST 18 01/03/2015 2021   ALT 11 04/27/2015 0825   ALT 11* 01/03/2015 2021   ALKPHOS 50 04/27/2015 0825   ALKPHOS 47 01/03/2015 2021   BILITOT 0.48 04/27/2015 0825   BILITOT 0.6 01/03/2015 2021   GFRNONAA >60 01/03/2015 2021   GFRAA >60 01/03/2015 2021  No results found for: SPEP, UPEP  Lab Results  Component Value Date   WBC 19.7* 04/27/2015   NEUTROABS 15.3* 04/27/2015   HGB 9.2* 04/27/2015   HCT 28.8* 04/27/2015   MCV 82.4 04/27/2015   PLT 91* 04/27/2015      Chemistry      Component Value Date/Time   NA 140 04/27/2015 0825   NA 139 01/03/2015 2021   K 3.9 04/27/2015 0825   K 4.0 01/03/2015 2021   CL 109 01/03/2015 2021   CO2 26 04/27/2015 0825   CO2 25 01/03/2015 2021   BUN 11.7 04/27/2015 0825   BUN 13 01/03/2015 2021   CREATININE 0.9 04/27/2015 0825   CREATININE 0.68 01/03/2015 2021      Component Value Date/Time   CALCIUM 9.1 04/27/2015 0825   CALCIUM 8.3* 01/03/2015 2021   ALKPHOS 50 04/27/2015 0825   ALKPHOS 47 01/03/2015 2021   AST 23 04/27/2015  0825   AST 18 01/03/2015 2021   ALT 11 04/27/2015 0825   ALT 11* 01/03/2015 2021   BILITOT 0.48 04/27/2015 0825   BILITOT 0.6 01/03/2015 2021      ASSESSMENT & PLAN:   MDS (myelodysplastic syndrome) ([place]) We have a very extensive discussion today Her calculated R-IPSS score here would put her in intermediate risk group but the higher blast count from bone marrow biopsy at [place] would put her at high risk We discussed prognosis. Treatment related MDS may confer to worse prognosis We discussed the pros and cons of allogenic stem cell transplant I recommend second opinion at [facility] and made the referral for her If she declines allogenic stem cell transplant, I can offer her palliative chemotherapy with Vidaza I recommend she contacts the bone marrow transplant coordinator at [facility] to initiate the process of testing her brother to determine eligibility as her donor  Multiple myeloma in remission ([place]) Her last bone marrow detected 1% plasma cells She is not symptomatic Observe only for now  Pancytopenia, 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 She is asymptomatic from the anemia. We will observe for now.  She does not require transfusion now.  We discussed the utility of ESA For now, she will return every 2 weeks to get blood checked and transfuse prn We will give her 2 unites of blood whenever hemoglobin is less than 8 grams She became symptomatic at that level and 1 unit of blood was not adequate to alleviate her symptoms of fatigue    All questions were answered. The patient knows to call the clinic with any problems, questions or concerns. No barriers to learning was detected. I spent 55 minutes counseling the patient face to face. The total time spent in the appointment was 60 minutes and more than 50% was on counseling and review of test results     [facility], [place], MD 05/01/2015 9:25  AM

## 2015-05-03 ENCOUNTER — Encounter: Payer: Self-pay | Admitting: Hematology and Oncology

## 2015-05-05 DIAGNOSIS — I6529 Occlusion and stenosis of unspecified carotid artery: Secondary | ICD-10-CM | POA: Diagnosis not present

## 2015-05-05 DIAGNOSIS — I8310 Varicose veins of unspecified lower extremity with inflammation: Secondary | ICD-10-CM | POA: Diagnosis not present

## 2015-05-05 DIAGNOSIS — I70213 Atherosclerosis of native arteries of extremities with intermittent claudication, bilateral legs: Secondary | ICD-10-CM | POA: Diagnosis not present

## 2015-05-05 DIAGNOSIS — M79609 Pain in unspecified limb: Secondary | ICD-10-CM | POA: Diagnosis not present

## 2015-05-10 ENCOUNTER — Other Ambulatory Visit: Payer: Medicare HMO

## 2015-05-10 ENCOUNTER — Encounter: Payer: Self-pay | Admitting: Hematology and Oncology

## 2015-05-10 ENCOUNTER — Ambulatory Visit: Payer: Medicare HMO | Admitting: Hematology and Oncology

## 2015-05-10 DIAGNOSIS — D696 Thrombocytopenia, unspecified: Secondary | ICD-10-CM | POA: Diagnosis not present

## 2015-05-10 DIAGNOSIS — C9000 Multiple myeloma not having achieved remission: Secondary | ICD-10-CM | POA: Diagnosis not present

## 2015-05-10 DIAGNOSIS — Z9481 Bone marrow transplant status: Secondary | ICD-10-CM | POA: Diagnosis not present

## 2015-05-10 DIAGNOSIS — I10 Essential (primary) hypertension: Secondary | ICD-10-CM | POA: Diagnosis not present

## 2015-05-10 DIAGNOSIS — Z7189 Other specified counseling: Secondary | ICD-10-CM | POA: Diagnosis not present

## 2015-05-10 DIAGNOSIS — Z87891 Personal history of nicotine dependence: Secondary | ICD-10-CM | POA: Diagnosis not present

## 2015-05-10 DIAGNOSIS — D469 Myelodysplastic syndrome, unspecified: Secondary | ICD-10-CM | POA: Diagnosis not present

## 2015-05-10 DIAGNOSIS — Z01818 Encounter for other preprocedural examination: Secondary | ICD-10-CM | POA: Diagnosis not present

## 2015-05-11 ENCOUNTER — Other Ambulatory Visit (HOSPITAL_BASED_OUTPATIENT_CLINIC_OR_DEPARTMENT_OTHER): Payer: Medicare HMO

## 2015-05-11 ENCOUNTER — Encounter: Payer: Self-pay | Admitting: Hematology and Oncology

## 2015-05-11 ENCOUNTER — Other Ambulatory Visit: Payer: Self-pay | Admitting: Hematology and Oncology

## 2015-05-11 ENCOUNTER — Other Ambulatory Visit: Payer: Medicare HMO

## 2015-05-11 ENCOUNTER — Ambulatory Visit: Payer: Medicare HMO

## 2015-05-11 ENCOUNTER — Ambulatory Visit (HOSPITAL_BASED_OUTPATIENT_CLINIC_OR_DEPARTMENT_OTHER): Payer: Medicare HMO

## 2015-05-11 VITALS — BP 128/55 | HR 68 | Temp 98.9°F | Resp 18

## 2015-05-11 DIAGNOSIS — D469 Myelodysplastic syndrome, unspecified: Secondary | ICD-10-CM

## 2015-05-11 DIAGNOSIS — Z95828 Presence of other vascular implants and grafts: Secondary | ICD-10-CM

## 2015-05-11 DIAGNOSIS — C9000 Multiple myeloma not having achieved remission: Secondary | ICD-10-CM | POA: Diagnosis not present

## 2015-05-11 DIAGNOSIS — D4620 Refractory anemia with excess of blasts, unspecified: Secondary | ICD-10-CM

## 2015-05-11 LAB — CBC WITH DIFFERENTIAL/PLATELET
BASO%: 0.3 % (ref 0.0–2.0)
Basophils Absolute: 0.1 10*3/uL (ref 0.0–0.1)
EOS%: 0.4 % (ref 0.0–7.0)
Eosinophils Absolute: 0.1 10*3/uL (ref 0.0–0.5)
HCT: 24.5 % — ABNORMAL LOW (ref 34.8–46.6)
HGB: 7.3 g/dL — ABNORMAL LOW (ref 11.6–15.9)
LYMPH%: 19.1 % (ref 14.0–49.7)
MCH: 24.6 pg — ABNORMAL LOW (ref 25.1–34.0)
MCHC: 29.8 g/dL — ABNORMAL LOW (ref 31.5–36.0)
MCV: 82.5 fL (ref 79.5–101.0)
MONO#: 2.2 10*3/uL — ABNORMAL HIGH (ref 0.1–0.9)
MONO%: 12.1 % (ref 0.0–14.0)
NEUT#: 12 10*3/uL — ABNORMAL HIGH (ref 1.5–6.5)
NEUT%: 68.1 % (ref 38.4–76.8)
Platelets: 46 10*3/uL — ABNORMAL LOW (ref 145–400)
RBC: 2.97 10*6/uL — ABNORMAL LOW (ref 3.70–5.45)
RDW: 18.8 % — ABNORMAL HIGH (ref 11.2–14.5)
WBC: 17.7 10*3/uL — ABNORMAL HIGH (ref 3.9–10.3)
lymph#: 3.4 10*3/uL — ABNORMAL HIGH (ref 0.9–3.3)
nRBC: 1 % — ABNORMAL HIGH (ref 0–0)

## 2015-05-11 LAB — COMPREHENSIVE METABOLIC PANEL (CC13)
ALT: 10 U/L (ref 0–55)
AST: 22 U/L (ref 5–34)
Albumin: 3.8 g/dL (ref 3.5–5.0)
Alkaline Phosphatase: 47 U/L (ref 40–150)
Anion Gap: 8 mEq/L (ref 3–11)
BUN: 11.6 mg/dL (ref 7.0–26.0)
CO2: 25 mEq/L (ref 22–29)
Calcium: 9 mg/dL (ref 8.4–10.4)
Chloride: 105 mEq/L (ref 98–109)
Creatinine: 0.9 mg/dL (ref 0.6–1.1)
EGFR: 68 mL/min/{1.73_m2} — ABNORMAL LOW (ref >90)
Glucose: 106 mg/dl (ref 70–140)
Potassium: 4.1 mEq/L (ref 3.5–5.1)
Sodium: 138 mEq/L (ref 136–145)
Total Bilirubin: 0.61 mg/dL (ref 0.20–1.20)
Total Protein: 6.6 g/dL (ref 6.4–8.3)

## 2015-05-11 LAB — HOLD TUBE, BLOOD BANK

## 2015-05-11 LAB — PREPARE RBC (CROSSMATCH)

## 2015-05-11 LAB — TECHNOLOGIST REVIEW

## 2015-05-11 MED ORDER — ACETAMINOPHEN 325 MG PO TABS
650.0000 mg | ORAL_TABLET | Freq: Once | ORAL | Status: AC
  Administered 2015-05-11: 650 mg via ORAL

## 2015-05-11 MED ORDER — SODIUM CHLORIDE 0.9 % IJ SOLN
10.0000 mL | INTRAMUSCULAR | Status: DC | PRN
  Administered 2015-05-11: 10 mL via INTRAVENOUS
  Filled 2015-05-11: qty 10

## 2015-05-11 MED ORDER — SODIUM CHLORIDE 0.9 % IJ SOLN
10.0000 mL | INTRAMUSCULAR | Status: AC | PRN
  Administered 2015-05-11: 10 mL
  Filled 2015-05-11: qty 10

## 2015-05-11 MED ORDER — ACETAMINOPHEN 325 MG PO TABS
ORAL_TABLET | ORAL | Status: AC
  Filled 2015-05-11: qty 2

## 2015-05-11 MED ORDER — HEPARIN SOD (PORK) LOCK FLUSH 100 UNIT/ML IV SOLN
500.0000 [IU] | Freq: Every day | INTRAVENOUS | Status: AC | PRN
  Administered 2015-05-11: 500 [IU]
  Filled 2015-05-11: qty 5

## 2015-05-11 MED ORDER — SODIUM CHLORIDE 0.9 % IV SOLN
250.0000 mL | Freq: Once | INTRAVENOUS | Status: AC
  Administered 2015-05-11: 250 mL via INTRAVENOUS

## 2015-05-11 MED ORDER — DIPHENHYDRAMINE HCL 25 MG PO CAPS
25.0000 mg | ORAL_CAPSULE | Freq: Once | ORAL | Status: AC
  Administered 2015-05-11: 25 mg via ORAL

## 2015-05-11 MED ORDER — DIPHENHYDRAMINE HCL 25 MG PO CAPS
ORAL_CAPSULE | ORAL | Status: AC
  Filled 2015-05-11: qty 1

## 2015-05-11 NOTE — Patient Instructions (Signed)

## 2015-05-11 NOTE — Patient Instructions (Signed)

## 2015-05-12 ENCOUNTER — Other Ambulatory Visit: Payer: Self-pay | Admitting: Hematology and Oncology

## 2015-05-12 LAB — TYPE AND SCREEN
ABO/RH(D): O POS
Antibody Screen: NEGATIVE
Unit division: 0

## 2015-05-14 ENCOUNTER — Encounter (HOSPITAL_COMMUNITY): Payer: Self-pay

## 2015-05-15 ENCOUNTER — Other Ambulatory Visit: Payer: Self-pay | Admitting: Hematology and Oncology

## 2015-05-18 ENCOUNTER — Other Ambulatory Visit (HOSPITAL_BASED_OUTPATIENT_CLINIC_OR_DEPARTMENT_OTHER): Payer: Medicare HMO

## 2015-05-18 ENCOUNTER — Other Ambulatory Visit: Payer: Self-pay | Admitting: Hematology and Oncology

## 2015-05-18 ENCOUNTER — Other Ambulatory Visit: Payer: Self-pay | Admitting: *Deleted

## 2015-05-18 ENCOUNTER — Telehealth: Payer: Self-pay | Admitting: *Deleted

## 2015-05-18 DIAGNOSIS — C9001 Multiple myeloma in remission: Secondary | ICD-10-CM

## 2015-05-18 DIAGNOSIS — C9000 Multiple myeloma not having achieved remission: Secondary | ICD-10-CM | POA: Diagnosis not present

## 2015-05-18 LAB — CBC WITH DIFFERENTIAL/PLATELET
BASO%: 0.3 % (ref 0.0–2.0)
Basophils Absolute: 0.1 10*3/uL (ref 0.0–0.1)
EOS%: 0.3 % (ref 0.0–7.0)
Eosinophils Absolute: 0.1 10*3/uL (ref 0.0–0.5)
HCT: 28.8 % — ABNORMAL LOW (ref 34.8–46.6)
HGB: 8.7 g/dL — ABNORMAL LOW (ref 11.6–15.9)
LYMPH%: 17.5 % (ref 14.0–49.7)
MCH: 24.6 pg — ABNORMAL LOW (ref 25.1–34.0)
MCHC: 30.2 g/dL — ABNORMAL LOW (ref 31.5–36.0)
MCV: 81.4 fL (ref 79.5–101.0)
MONO#: 3.8 10*3/uL — ABNORMAL HIGH (ref 0.1–0.9)
MONO%: 15.3 % — ABNORMAL HIGH (ref 0.0–14.0)
NEUT#: 16.7 10*3/uL — ABNORMAL HIGH (ref 1.5–6.5)
NEUT%: 66.6 % (ref 38.4–76.8)
Platelets: 56 10*3/uL — ABNORMAL LOW (ref 145–400)
RBC: 3.54 10*6/uL — ABNORMAL LOW (ref 3.70–5.45)
RDW: 18.9 % — ABNORMAL HIGH (ref 11.2–14.5)
WBC: 25.1 10*3/uL — ABNORMAL HIGH (ref 3.9–10.3)
lymph#: 4.4 10*3/uL — ABNORMAL HIGH (ref 0.9–3.3)
nRBC: 1 % — ABNORMAL HIGH (ref 0–0)

## 2015-05-18 LAB — COMPREHENSIVE METABOLIC PANEL (CC13)
ALT: 13 U/L (ref 0–55)
AST: 29 U/L (ref 5–34)
Albumin: 4.2 g/dL (ref 3.5–5.0)
Alkaline Phosphatase: 51 U/L (ref 40–150)
Anion Gap: 10 mEq/L (ref 3–11)
BUN: 12.1 mg/dL (ref 7.0–26.0)
CO2: 25 mEq/L (ref 22–29)
Calcium: 9.2 mg/dL (ref 8.4–10.4)
Chloride: 104 mEq/L (ref 98–109)
Creatinine: 1 mg/dL (ref 0.6–1.1)
EGFR: 60 mL/min/{1.73_m2} — ABNORMAL LOW (ref >90)
Glucose: 108 mg/dl (ref 70–140)
Potassium: 4 mEq/L (ref 3.5–5.1)
Sodium: 138 mEq/L (ref 136–145)
Total Bilirubin: 0.7 mg/dL (ref 0.20–1.20)
Total Protein: 7.2 g/dL (ref 6.4–8.3)

## 2015-05-18 LAB — HOLD TUBE, BLOOD BANK

## 2015-05-18 LAB — TECHNOLOGIST REVIEW: Technologist Review: 4

## 2015-05-18 NOTE — Telephone Encounter (Signed)
We can bring her in for labs and see what the values are If she need tx we will figure it out

## 2015-05-18 NOTE — Telephone Encounter (Signed)
Received call from pt stating that she had a unit of blood on last tues & probably should have received two.  She is getting ready to go to [place] thinks she may need another unit & wonders if we can squeeze her in.  Message to Dr.  Alvy Bimler & RN.

## 2015-05-19 ENCOUNTER — Encounter: Payer: Self-pay | Admitting: Hematology and Oncology

## 2015-05-25 ENCOUNTER — Ambulatory Visit: Payer: Medicare HMO

## 2015-05-25 ENCOUNTER — Other Ambulatory Visit: Payer: Self-pay | Admitting: *Deleted

## 2015-05-25 ENCOUNTER — Encounter: Payer: Self-pay | Admitting: Hematology and Oncology

## 2015-05-25 ENCOUNTER — Other Ambulatory Visit: Payer: Medicare HMO

## 2015-05-25 ENCOUNTER — Other Ambulatory Visit (HOSPITAL_BASED_OUTPATIENT_CLINIC_OR_DEPARTMENT_OTHER): Payer: Medicare HMO

## 2015-05-25 ENCOUNTER — Telehealth: Payer: Self-pay | Admitting: Hematology and Oncology

## 2015-05-25 ENCOUNTER — Ambulatory Visit (HOSPITAL_BASED_OUTPATIENT_CLINIC_OR_DEPARTMENT_OTHER): Payer: Medicare HMO | Admitting: Hematology and Oncology

## 2015-05-25 ENCOUNTER — Ambulatory Visit (HOSPITAL_BASED_OUTPATIENT_CLINIC_OR_DEPARTMENT_OTHER): Payer: Medicare HMO

## 2015-05-25 VITALS — BP 138/68 | HR 92 | Temp 98.3°F | Resp 20 | Ht 63.0 in | Wt 156.2 lb

## 2015-05-25 VITALS — BP 133/53 | HR 75 | Temp 97.6°F | Resp 16

## 2015-05-25 DIAGNOSIS — D696 Thrombocytopenia, unspecified: Secondary | ICD-10-CM

## 2015-05-25 DIAGNOSIS — D61818 Other pancytopenia: Secondary | ICD-10-CM | POA: Diagnosis not present

## 2015-05-25 DIAGNOSIS — C9001 Multiple myeloma in remission: Secondary | ICD-10-CM | POA: Diagnosis not present

## 2015-05-25 DIAGNOSIS — D469 Myelodysplastic syndrome, unspecified: Secondary | ICD-10-CM | POA: Diagnosis not present

## 2015-05-25 DIAGNOSIS — Z95828 Presence of other vascular implants and grafts: Secondary | ICD-10-CM

## 2015-05-25 DIAGNOSIS — D4620 Refractory anemia with excess of blasts, unspecified: Secondary | ICD-10-CM | POA: Diagnosis not present

## 2015-05-25 LAB — COMPREHENSIVE METABOLIC PANEL (CC13)
ALT: 9 U/L (ref 0–55)
AST: 25 U/L (ref 5–34)
Albumin: 3.7 g/dL (ref 3.5–5.0)
Alkaline Phosphatase: 44 U/L (ref 40–150)
Anion Gap: 8 mEq/L (ref 3–11)
BUN: 9.3 mg/dL (ref 7.0–26.0)
CO2: 25 mEq/L (ref 22–29)
Calcium: 8.7 mg/dL (ref 8.4–10.4)
Chloride: 106 mEq/L (ref 98–109)
Creatinine: 0.9 mg/dL (ref 0.6–1.1)
EGFR: 67 mL/min/{1.73_m2} — ABNORMAL LOW (ref >90)
Glucose: 109 mg/dl (ref 70–140)
Potassium: 3.5 mEq/L (ref 3.5–5.1)
Sodium: 139 mEq/L (ref 136–145)
Total Bilirubin: 0.55 mg/dL (ref 0.20–1.20)
Total Protein: 6.6 g/dL (ref 6.4–8.3)

## 2015-05-25 LAB — CBC WITH DIFFERENTIAL/PLATELET
BASO%: 0.5 % (ref 0.0–2.0)
Basophils Absolute: 0.1 10*3/uL (ref 0.0–0.1)
EOS%: 0.3 % (ref 0.0–7.0)
Eosinophils Absolute: 0.1 10*3/uL (ref 0.0–0.5)
HCT: 25.4 % — ABNORMAL LOW (ref 34.8–46.6)
HGB: 7.5 g/dL — ABNORMAL LOW (ref 11.6–15.9)
LYMPH%: 13.5 % — ABNORMAL LOW (ref 14.0–49.7)
MCH: 22.9 pg — ABNORMAL LOW (ref 25.1–34.0)
MCHC: 29.4 g/dL — ABNORMAL LOW (ref 31.5–36.0)
MCV: 78.2 fL — ABNORMAL LOW (ref 79.5–101.0)
MONO#: 2.7 10*3/uL — ABNORMAL HIGH (ref 0.1–0.9)
MONO%: 9.9 % (ref 0.0–14.0)
NEUT#: 21 10*3/uL — ABNORMAL HIGH (ref 1.5–6.5)
NEUT%: 75.8 % (ref 38.4–76.8)
Platelets: 50 10*3/uL — ABNORMAL LOW (ref 145–400)
RBC: 3.25 10*6/uL — ABNORMAL LOW (ref 3.70–5.45)
RDW: 20.4 % — ABNORMAL HIGH (ref 11.2–14.5)
WBC: 27.7 10*3/uL — ABNORMAL HIGH (ref 3.9–10.3)
lymph#: 3.7 10*3/uL — ABNORMAL HIGH (ref 0.9–3.3)

## 2015-05-25 LAB — TECHNOLOGIST REVIEW

## 2015-05-25 LAB — PREPARE RBC (CROSSMATCH)

## 2015-05-25 LAB — HOLD TUBE, BLOOD BANK

## 2015-05-25 MED ORDER — SODIUM CHLORIDE 0.9 % IV SOLN
250.0000 mL | Freq: Once | INTRAVENOUS | Status: AC
  Administered 2015-05-25: 250 mL via INTRAVENOUS

## 2015-05-25 MED ORDER — ACETAMINOPHEN 325 MG PO TABS
ORAL_TABLET | ORAL | Status: AC
  Filled 2015-05-25: qty 2

## 2015-05-25 MED ORDER — SODIUM CHLORIDE 0.9 % IJ SOLN
10.0000 mL | INTRAMUSCULAR | Status: AC | PRN
  Administered 2015-05-25: 10 mL
  Filled 2015-05-25: qty 10

## 2015-05-25 MED ORDER — HEPARIN SOD (PORK) LOCK FLUSH 100 UNIT/ML IV SOLN
500.0000 [IU] | Freq: Every day | INTRAVENOUS | Status: AC | PRN
  Administered 2015-05-25: 500 [IU]
  Filled 2015-05-25: qty 5

## 2015-05-25 MED ORDER — DIPHENHYDRAMINE HCL 25 MG PO CAPS
ORAL_CAPSULE | ORAL | Status: AC
  Filled 2015-05-25: qty 1

## 2015-05-25 MED ORDER — ACETAMINOPHEN 325 MG PO TABS
650.0000 mg | ORAL_TABLET | Freq: Once | ORAL | Status: AC
  Administered 2015-05-25: 650 mg via ORAL

## 2015-05-25 MED ORDER — DIPHENHYDRAMINE HCL 25 MG PO CAPS
25.0000 mg | ORAL_CAPSULE | Freq: Once | ORAL | Status: AC
Start: 2015-05-25 — End: 2015-05-25
  Administered 2015-05-25: 25 mg via ORAL

## 2015-05-25 MED ORDER — SODIUM CHLORIDE 0.9 % IJ SOLN
10.0000 mL | INTRAMUSCULAR | Status: DC | PRN
  Administered 2015-05-25: 10 mL via INTRAVENOUS
  Filled 2015-05-25: qty 10

## 2015-05-25 NOTE — Assessment & Plan Note (Signed)
The patient has made informed decision to pursue allogeneic stem cell transplant. Her only sibling, her brother is currently being tested. I will defer to [facility] for arranging for transplantation in the near future. In the meantime, I will continue to provide supportive care.

## 2015-05-25 NOTE — Patient Instructions (Signed)

## 2015-05-25 NOTE — Assessment & Plan Note (Signed)
We discussed some of the risks, benefits, and alternatives of blood transfusions. The patient is symptomatic from anemia and the hemoglobin level is critically low.  Some of the side-effects to be expected including risks of transfusion reactions, chills, infection, syndrome of volume overload and risk of hospitalization from various reasons and the patient is willing to proceed and went ahead to sign consent today. Previously, I only give her 1 unit of blood transfusion. She did not find that helpful and have excessive fatigue. She would like to try 2 units of blood instead. I also discussed with her the risks, benefits, side effects of Aranesp and she will think about it.

## 2015-05-25 NOTE — Progress Notes (Signed)
[facility] OFFICE PROGRESS NOTE  Patient Care Team: Jinny Sanders, MD as PCP - General (Family Medicine) [name], MD as Referring Physician (Hematology and Oncology) [name], MD as Consulting Physician (Internal Medicine) Truman Hayward, MD as Consulting Physician (Infectious Diseases) Deanne Coffer An Nila Nephew, MD as Consulting Physician (Hematology and Oncology)  SUMMARY OF ONCOLOGIC HISTORY: Oncology History   Multiple myeloma, Ig A Lambda, M spike 3.54 grams, Calcium 9.2, Creatinine 0.8, Beta 2 microglobulin 4.52, IgA 4840 mg/dL, lambda light chain 75.4, albumin 3.6, hemoglobin 9.7, platelet 115    Primary site: Multiple Myeloma   Staging method: AJCC 6th Edition   Clinical: Stage IIA signed by [name], MD on 11/07/2013  2:46 PM   Summary: Stage IIA        Multiple myeloma in remission ([place])   10/31/2013 Bone Marrow Biopsy Bone marrow biopsy confirmed multiple myeloma with 40% bone marrow involvement. Skeletal survey showed minimal lesions in her score with generalized demineralization   11/10/2013 - 02/13/2014 Chemotherapy The patient is started on induction chemotherapy with weekly dexamethasone 40 mg by mouth as well as Velcade subcutaneous injection on days 1, 4, 8 and 11. On 11/21/2013, she was started on monthly Zometa.   12/23/2013 Adverse Reaction The dose of Velcade was reduced due to thrombocytopenia.   01/28/2014 - 04/07/2014 Chemotherapy Revlimid is added. Treatment was discontinued due to lack of response.   02/24/2014 - 04/07/2014 Chemotherapy Due to worsening peripheral neuropathy, Velcade injection is changed to once a week. Revlimid was given 21 days on, 7 days off.   04/07/2014 - 04/10/2014 Chemotherapy Revlimid was discontinued due to lack of response. Chemotherapy was changed back to Velcade injection twice a week, 2 weeks on 1 week off. Her treatment was switched to to minimum response   04/20/2014 - 06/02/2014 Chemotherapy  chemotherapy is switched to Carfilzomib, Cytoxan and dexamethasone.   04/22/2014 Procedure she has placement of port for chemotherapy.   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 [facility]   10/20/2014 - 03/24/2015 Chemotherapy  she received chemotherapy with Kyprolis, Revlimid and dexamethasone   10/22/2014 Procedure She has port placement   01/19/2015 Tumor Marker IgA lambda M spike at 0.4 g    01/20/2015 Miscellaneous IVIG 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  she returns for further follow-up. She is anxious awaiting for transplant workup. She complained of persistent fatigue and could not function with basic activities of daily living with the anemia. The patient denies any recent signs or symptoms of bleeding such as spontaneous epistaxis, hematuria or hematochezia.  She denies recent infection.  REVIEW OF SYSTEMS:   Constitutional: Denies fevers, chills or  abnormal weight loss Eyes: Denies blurriness of vision Ears, nose, mouth, throat, and face: Denies mucositis or sore throat Respiratory: Denies cough, dyspnea or  wheezes Cardiovascular: Denies palpitation, chest discomfort or lower extremity swelling Gastrointestinal:  Denies nausea, heartburn or change in bowel habits Skin: Denies abnormal skin rashes Lymphatics: Denies new lymphadenopathy or easy bruising Neurological:Denies numbness, tingling or new weaknesses Behavioral/Psych: Mood is stable, no new changes  All other systems were reviewed with the patient and are negative.  I have reviewed the past medical history, past surgical history, social history and family history with the patient and they are unchanged from previous note.  ALLERGIES:  is allergic to penicillins and sulfa antibiotics.  MEDICATIONS:  Current Outpatient Prescriptions  Medication Sig Dispense Refill  . acetaminophen (TYLENOL) 500 MG tablet Take 1,500 mg by mouth as needed for moderate pain, fever or headache.     Marland Kitchen acyclovir (ZOVIRAX) 400 MG tablet Take 1 tablet (400 mg total) by mouth 2 (two) times daily. 180 tablet 3  . calcium carbonate (TUMS EX) 750 MG chewable tablet Chew 1 tablet by mouth 3 (three) times daily as needed for heartburn.     . carvedilol (COREG) 6.25 MG tablet Take 0.5 tablets (3.125 mg total) by mouth 2 (two) times daily with a meal. 90 tablet 3  . cholecalciferol (VITAMIN D) 1000 UNITS tablet Take 1,000 Units by mouth daily.    Marland Kitchen docusate sodium (COLACE) 100 MG capsule Take 100 mg by mouth 2 (two) times daily as needed for mild constipation or moderate constipation.     Marland Kitchen loperamide (IMODIUM) 2 MG capsule Take by mouth as needed for diarrhea or loose stools.    . mirtazapine (REMERON) 30 MG tablet Take 1 tablet (30 mg total) by mouth at bedtime. 90 tablet 3  . Multiple Vitamin (MULTIVITAMIN WITH MINERALS) TABS tablet Take 1 tablet by mouth daily.    Levin Erp SULFATE VAGINAL (TRIMO-SAN) 0.025 % GEL Place 1 application vaginally 2 (two) times a week.     No current facility-administered medications for this visit.   Facility-Administered  Medications Ordered in Other Visits  Medication Dose Route Frequency Provider Last Rate Last Dose  . heparin lock flush 100 unit/mL  500 Units Intracatheter Daily PRN [name], MD      . sodium chloride 0.9 % injection 10 mL  10 mL Intracatheter PRN [name], MD        PHYSICAL EXAMINATION: ECOG PERFORMANCE STATUS: 1 - Symptomatic but completely ambulatory  Filed Vitals:   05/25/15 0851  BP: 138/68  Pulse: 92  Temp: 98.3 F (36.8 C)  Resp: 20   Filed Weights   05/25/15 0851  Weight: 156 lb 3.2 oz (70.852 kg)    GENERAL:alert, no distress and comfortable SKIN: skin color is pale, texture, turgor are normal, no rashes or significant lesions EYES: normal, Conjunctiva are pale and non-injected, sclera clear Musculoskeletal:no cyanosis of digits and no clubbing  NEURO: alert & oriented x 3 with fluent speech, no focal motor/sensory deficits  LABORATORY DATA:  I have reviewed the data as listed    Component Value Date/Time   NA 139 05/25/2015 0819   NA 139 01/03/2015 2021   K 3.5 05/25/2015 0819   K 4.0 01/03/2015 2021   CL 109 01/03/2015 2021   CO2 25 05/25/2015 0819   CO2 25 01/03/2015 2021   GLUCOSE 109 05/25/2015 0819   GLUCOSE 115* 01/03/2015 2021   BUN 9.3 05/25/2015 0819   BUN 13 01/03/2015  2021   CREATININE 0.9 05/25/2015 0819   CREATININE 0.68 01/03/2015 2021   CALCIUM 8.7 05/25/2015 0819   CALCIUM 8.3* 01/03/2015 2021   PROT 6.6 05/25/2015 0819   PROT 6.5 01/03/2015 2021   ALBUMIN 3.7 05/25/2015 0819   ALBUMIN 4.1 01/03/2015 2021   AST 25 05/25/2015 0819   AST 18 01/03/2015 2021   ALT 9 05/25/2015 0819   ALT 11* 01/03/2015 2021   ALKPHOS 44 05/25/2015 0819   ALKPHOS 47 01/03/2015 2021   BILITOT 0.55 05/25/2015 0819   BILITOT 0.6 01/03/2015 2021   GFRNONAA >60 01/03/2015 2021   GFRAA >60 01/03/2015 2021    No results found for: SPEP, UPEP  Lab Results  Component Value Date   WBC 27.7* 05/25/2015   NEUTROABS 21.0* 05/25/2015   HGB 7.5*  05/25/2015   HCT 25.4* 05/25/2015   MCV 78.2* 05/25/2015   PLT 50* 05/25/2015      Chemistry      Component Value Date/Time   NA 139 05/25/2015 0819   NA 139 01/03/2015 2021   K 3.5 05/25/2015 0819   K 4.0 01/03/2015 2021   CL 109 01/03/2015 2021   CO2 25 05/25/2015 0819   CO2 25 01/03/2015 2021   BUN 9.3 05/25/2015 0819   BUN 13 01/03/2015 2021   CREATININE 0.9 05/25/2015 0819   CREATININE 0.68 01/03/2015 2021      Component Value Date/Time   CALCIUM 8.7 05/25/2015 0819   CALCIUM 8.3* 01/03/2015 2021   ALKPHOS 44 05/25/2015 0819   ALKPHOS 47 01/03/2015 2021   AST 25 05/25/2015 0819   AST 18 01/03/2015 2021   ALT 9 05/25/2015 0819   ALT 11* 01/03/2015 2021   BILITOT 0.55 05/25/2015 0819   BILITOT 0.6 01/03/2015 2021      ASSESSMENT & PLAN:  MDS (myelodysplastic syndrome) ([place]) The patient has made informed decision to pursue allogeneic stem cell transplant. Her only sibling, her brother is currently being tested. I will defer to [facility] for arranging for transplantation in the near future. In the meantime, I will continue to provide supportive care.  Multiple myeloma in remission [facility])  The patient is anxious to know the state of her multiple myeloma. Her most recent bone marrow biopsy detected only 1% plasma cells.  I do not recommend ordering serum protein electrophoresis and free light chain as patient is currently not a candidate for treatment  Pancytopenia, acquired ([place]) We discussed some of the risks, benefits, and alternatives of blood transfusions. The patient is symptomatic from anemia and the hemoglobin level is critically low.  Some of the side-effects to be expected including risks of transfusion reactions, chills, infection, syndrome of volume overload and risk of hospitalization from various reasons and the patient is willing to proceed and went ahead to sign consent today. Previously, I only give her 1 unit of blood transfusion. She did not  find that helpful and have excessive fatigue. She would like to try 2 units of blood instead. I also discussed with her the risks, benefits, side effects of Aranesp and she will think about it.  Thrombocytopenia ([place]) The cause is related to MDS. It is mild and there is little change compared from previous platelet count. The patient denies recent history of bleeding such as epistaxis, hematuria or hematochezia. She is asymptomatic from the thrombocytopenia. I will observe for now.  she does not require transfusion now.      No orders of the defined types were placed in this  encounter.   All questions were answered. The patient knows to call the clinic with any problems, questions or concerns. No barriers to learning was detected. I spent 20 minutes counseling the patient face to face. The total time spent in the appointment was 25 minutes and more than 50% was on counseling and review of test results     [facility], [place], MD 05/25/2015 9:31 AM  ,

## 2015-05-25 NOTE — Telephone Encounter (Signed)
Gave and printed appts ched and avs for pt for DEC  °

## 2015-05-25 NOTE — Assessment & Plan Note (Signed)
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 It is mild and there is little change compared from previous platelet count. The patient denies recent history of bleeding such as epistaxis, hematuria or hematochezia. She is asymptomatic from the thrombocytopenia. I will observe for now.  she does not require transfusion now.    

## 2015-05-25 NOTE — Assessment & Plan Note (Signed)
The patient is anxious to know the state of her multiple myeloma. Her most recent bone marrow biopsy detected only 1% plasma cells.  I do not recommend ordering serum protein electrophoresis and free light chain as patient is currently not a candidate for treatment

## 2015-05-25 NOTE — Patient Instructions (Signed)

## 2015-05-26 LAB — TYPE AND SCREEN
ABO/RH(D): O POS
Antibody Screen: NEGATIVE
Unit division: 0
Unit division: 0

## 2015-05-27 ENCOUNTER — Ambulatory Visit (HOSPITAL_COMMUNITY)
Admission: RE | Admit: 2015-05-27 | Discharge: 2015-05-27 | Disposition: A | Discharge status: Home / Self Care | Payer: Medicare HMO | Source: Ambulatory Visit | Attending: Hematology and Oncology | Admitting: Hematology and Oncology

## 2015-05-27 DIAGNOSIS — D469 Myelodysplastic syndrome, unspecified: Secondary | ICD-10-CM | POA: Insufficient documentation

## 2015-05-30 ENCOUNTER — Encounter: Payer: Self-pay | Admitting: Hematology and Oncology

## 2015-05-31 ENCOUNTER — Other Ambulatory Visit: Payer: Self-pay | Admitting: Hematology and Oncology

## 2015-05-31 ENCOUNTER — Other Ambulatory Visit: Payer: Self-pay | Admitting: *Deleted

## 2015-05-31 DIAGNOSIS — D469 Myelodysplastic syndrome, unspecified: Secondary | ICD-10-CM | POA: Diagnosis not present

## 2015-05-31 DIAGNOSIS — D72829 Elevated white blood cell count, unspecified: Secondary | ICD-10-CM | POA: Diagnosis not present

## 2015-05-31 DIAGNOSIS — C9002 Multiple myeloma in relapse: Secondary | ICD-10-CM | POA: Diagnosis not present

## 2015-05-31 DIAGNOSIS — Z8579 Personal history of other malignant neoplasms of lymphoid, hematopoietic and related tissues: Secondary | ICD-10-CM | POA: Diagnosis not present

## 2015-05-31 DIAGNOSIS — Z79899 Other long term (current) drug therapy: Secondary | ICD-10-CM | POA: Diagnosis not present

## 2015-05-31 MED ORDER — ALPRAZOLAM 0.5 MG PO TABS: 0.5000 mg | ORAL_TABLET | Freq: Two times a day (BID) | ORAL | Status: DC | PRN

## 2015-05-31 NOTE — Telephone Encounter (Signed)
Refill on Xanax called into [facility] in [place].  Notified pt of refill called. She verbalized understanding.

## 2015-06-07 ENCOUNTER — Other Ambulatory Visit: Payer: Self-pay | Admitting: *Deleted

## 2015-06-07 ENCOUNTER — Encounter: Payer: Self-pay | Admitting: Hematology and Oncology

## 2015-06-08 ENCOUNTER — Encounter: Payer: Self-pay | Admitting: Hematology and Oncology

## 2015-06-08 ENCOUNTER — Other Ambulatory Visit (HOSPITAL_BASED_OUTPATIENT_CLINIC_OR_DEPARTMENT_OTHER): Payer: Medicare HMO

## 2015-06-08 ENCOUNTER — Other Ambulatory Visit: Payer: Self-pay | Admitting: Hematology and Oncology

## 2015-06-08 DIAGNOSIS — C9000 Multiple myeloma not having achieved remission: Secondary | ICD-10-CM | POA: Diagnosis not present

## 2015-06-08 DIAGNOSIS — D469 Myelodysplastic syndrome, unspecified: Secondary | ICD-10-CM

## 2015-06-08 DIAGNOSIS — D4620 Refractory anemia with excess of blasts, unspecified: Secondary | ICD-10-CM

## 2015-06-08 LAB — CBC WITH DIFFERENTIAL/PLATELET
BASO%: 0.3 % (ref 0.0–2.0)
Basophils Absolute: 0.1 10*3/uL (ref 0.0–0.1)
EOS%: 0.7 % (ref 0.0–7.0)
Eosinophils Absolute: 0.3 10*3/uL (ref 0.0–0.5)
HCT: 30.8 % — ABNORMAL LOW (ref 34.8–46.6)
HGB: 9.5 g/dL — ABNORMAL LOW (ref 11.6–15.9)
LYMPH%: 13.5 % — ABNORMAL LOW (ref 14.0–49.7)
MCH: 25.2 pg (ref 25.1–34.0)
MCHC: 30.8 g/dL — ABNORMAL LOW (ref 31.5–36.0)
MCV: 81.7 fL (ref 79.5–101.0)
MONO#: 5.6 10*3/uL — ABNORMAL HIGH (ref 0.1–0.9)
MONO%: 15.6 % — ABNORMAL HIGH (ref 0.0–14.0)
NEUT#: 25 10*3/uL — ABNORMAL HIGH (ref 1.5–6.5)
NEUT%: 69.9 % (ref 38.4–76.8)
Platelets: 36 10*3/uL — ABNORMAL LOW (ref 145–400)
RBC: 3.77 10*6/uL (ref 3.70–5.45)
RDW: 17.8 % — ABNORMAL HIGH (ref 11.2–14.5)
WBC: 35.7 10*3/uL — ABNORMAL HIGH (ref 3.9–10.3)
lymph#: 4.8 10*3/uL — ABNORMAL HIGH (ref 0.9–3.3)

## 2015-06-08 LAB — TECHNOLOGIST REVIEW

## 2015-06-08 LAB — HOLD TUBE, BLOOD BANK

## 2015-06-11 DIAGNOSIS — M25511 Pain in right shoulder: Secondary | ICD-10-CM | POA: Diagnosis not present

## 2015-06-15 ENCOUNTER — Encounter: Payer: Self-pay | Admitting: Hematology and Oncology

## 2015-06-15 ENCOUNTER — Other Ambulatory Visit: Payer: Self-pay | Admitting: Hematology and Oncology

## 2015-06-16 ENCOUNTER — Other Ambulatory Visit (HOSPITAL_BASED_OUTPATIENT_CLINIC_OR_DEPARTMENT_OTHER): Payer: Medicare HMO

## 2015-06-16 DIAGNOSIS — D469 Myelodysplastic syndrome, unspecified: Secondary | ICD-10-CM

## 2015-06-16 DIAGNOSIS — D4620 Refractory anemia with excess of blasts, unspecified: Secondary | ICD-10-CM | POA: Diagnosis not present

## 2015-06-16 LAB — MANUAL DIFFERENTIAL
ALC: 10.6 10*3/uL — ABNORMAL HIGH (ref 0.9–3.3)
ANC (CHCC manual diff): 33.5 10*3/uL — ABNORMAL HIGH (ref 1.5–6.5)
Band Neutrophils: 12 % — ABNORMAL HIGH (ref 0–10)
Basophil: 0 % (ref 0–2)
Blasts: 6 % — ABNORMAL HIGH (ref 0–0)
EOS: 0 % (ref 0–7)
LYMPH: 20 % (ref 14–49)
MONO: 11 % (ref 0–14)
Metamyelocytes: 12 % — ABNORMAL HIGH (ref 0–0)
Myelocytes: 15 % — ABNORMAL HIGH (ref 0–0)
Other Cell: 0 % (ref 0–0)
PLT EST: DECREASED
PROMYELO: 0 % (ref 0–0)
SEG: 24 % — ABNORMAL LOW (ref 38–77)
Variant Lymph: 0 % (ref 0–0)
nRBC: 1 % — ABNORMAL HIGH (ref 0–0)

## 2015-06-16 LAB — CBC WITH DIFFERENTIAL/PLATELET
HCT: 28.5 % — ABNORMAL LOW (ref 34.8–46.6)
HGB: 8.7 g/dL — ABNORMAL LOW (ref 11.6–15.9)
MCH: 24.6 pg — ABNORMAL LOW (ref 25.1–34.0)
MCHC: 30.5 g/dL — ABNORMAL LOW (ref 31.5–36.0)
MCV: 80.5 fL (ref 79.5–101.0)
Platelets: 40 10*3/uL — ABNORMAL LOW (ref 145–400)
RBC: 3.54 10*6/uL — ABNORMAL LOW (ref 3.70–5.45)
RDW: 18.1 % — ABNORMAL HIGH (ref 11.2–14.5)
WBC: 53.2 10*3/uL (ref 3.9–10.3)

## 2015-06-16 LAB — COMPREHENSIVE METABOLIC PANEL
ALT: 11 U/L (ref 0–55)
AST: 28 U/L (ref 5–34)
Albumin: 4 g/dL (ref 3.5–5.0)
Alkaline Phosphatase: 62 U/L (ref 40–150)
Anion Gap: 9 mEq/L (ref 3–11)
BUN: 11.3 mg/dL (ref 7.0–26.0)
CO2: 27 mEq/L (ref 22–29)
Calcium: 9.1 mg/dL (ref 8.4–10.4)
Chloride: 103 mEq/L (ref 98–109)
Creatinine: 1 mg/dL (ref 0.6–1.1)
EGFR: 56 mL/min/{1.73_m2} — ABNORMAL LOW (ref >90)
Glucose: 120 mg/dl (ref 70–140)
Potassium: 3.8 mEq/L (ref 3.5–5.1)
Sodium: 139 mEq/L (ref 136–145)
Total Bilirubin: 0.49 mg/dL (ref 0.20–1.20)
Total Protein: 7.5 g/dL (ref 6.4–8.3)

## 2015-06-16 LAB — HOLD TUBE, BLOOD BANK

## 2015-06-17 ENCOUNTER — Encounter: Payer: Self-pay | Admitting: Hematology and Oncology

## 2015-06-22 ENCOUNTER — Encounter: Payer: Self-pay | Admitting: Hematology and Oncology

## 2015-06-22 ENCOUNTER — Other Ambulatory Visit (HOSPITAL_BASED_OUTPATIENT_CLINIC_OR_DEPARTMENT_OTHER): Payer: Medicare HMO

## 2015-06-22 ENCOUNTER — Telehealth: Payer: Self-pay | Admitting: Hematology and Oncology

## 2015-06-22 ENCOUNTER — Ambulatory Visit (HOSPITAL_COMMUNITY)
Admission: RE | Admit: 2015-06-22 | Discharge: 2015-06-22 | Disposition: A | Discharge status: Home / Self Care | Payer: Medicare HMO | Source: Ambulatory Visit | Attending: Hematology and Oncology | Admitting: Hematology and Oncology

## 2015-06-22 ENCOUNTER — Ambulatory Visit (HOSPITAL_BASED_OUTPATIENT_CLINIC_OR_DEPARTMENT_OTHER): Payer: Medicare HMO | Admitting: Hematology and Oncology

## 2015-06-22 ENCOUNTER — Telehealth: Payer: Self-pay | Admitting: *Deleted

## 2015-06-22 VITALS — BP 125/49 | HR 90 | Temp 99.0°F | Resp 20

## 2015-06-22 VITALS — BP 146/55 | HR 98 | Temp 98.0°F | Resp 20 | Ht 63.0 in | Wt 153.4 lb

## 2015-06-22 DIAGNOSIS — D4620 Refractory anemia with excess of blasts, unspecified: Secondary | ICD-10-CM

## 2015-06-22 DIAGNOSIS — D4621 Refractory anemia with excess of blasts 1: Secondary | ICD-10-CM | POA: Diagnosis not present

## 2015-06-22 DIAGNOSIS — D469 Myelodysplastic syndrome, unspecified: Secondary | ICD-10-CM | POA: Diagnosis not present

## 2015-06-22 DIAGNOSIS — D696 Thrombocytopenia, unspecified: Secondary | ICD-10-CM

## 2015-06-22 DIAGNOSIS — D6959 Other secondary thrombocytopenia: Secondary | ICD-10-CM

## 2015-06-22 DIAGNOSIS — D61818 Other pancytopenia: Secondary | ICD-10-CM

## 2015-06-22 LAB — MANUAL DIFFERENTIAL
ALC: 4.4 10*3/uL — ABNORMAL HIGH (ref 0.9–3.3)
ANC (CHCC manual diff): 35.1 10*3/uL — ABNORMAL HIGH (ref 1.5–6.5)
Band Neutrophils: 11 % — ABNORMAL HIGH (ref 0–10)
Basophil: 0 % (ref 0–2)
Blasts: 0 % (ref 0–0)
EOS: 0 % (ref 0–7)
LYMPH: 11 % — ABNORMAL LOW (ref 14–49)
MONO: 1 % (ref 0–14)
Metamyelocytes: 13 % — ABNORMAL HIGH (ref 0–0)
Myelocytes: 15 % — ABNORMAL HIGH (ref 0–0)
Other Cell: 0 % (ref 0–0)
PLT EST: DECREASED
PROMYELO: 1 % — ABNORMAL HIGH (ref 0–0)
SEG: 48 % (ref 38–77)
Variant Lymph: 0 % (ref 0–0)
nRBC: 3 % — ABNORMAL HIGH (ref 0–0)

## 2015-06-22 LAB — COMPREHENSIVE METABOLIC PANEL
ALT: 15 U/L (ref 0–55)
AST: 26 U/L (ref 5–34)
Albumin: 3.9 g/dL (ref 3.5–5.0)
Alkaline Phosphatase: 55 U/L (ref 40–150)
Anion Gap: 10 mEq/L (ref 3–11)
BUN: 10.4 mg/dL (ref 7.0–26.0)
CO2: 26 mEq/L (ref 22–29)
Calcium: 9 mg/dL (ref 8.4–10.4)
Chloride: 104 mEq/L (ref 98–109)
Creatinine: 1 mg/dL (ref 0.6–1.1)
EGFR: 61 mL/min/{1.73_m2} — ABNORMAL LOW (ref >90)
Glucose: 109 mg/dl (ref 70–140)
Potassium: 3.9 mEq/L (ref 3.5–5.1)
Sodium: 139 mEq/L (ref 136–145)
Total Bilirubin: 0.48 mg/dL (ref 0.20–1.20)
Total Protein: 7.4 g/dL (ref 6.4–8.3)

## 2015-06-22 LAB — PREPARE RBC (CROSSMATCH)

## 2015-06-22 LAB — HOLD TUBE, BLOOD BANK

## 2015-06-22 LAB — CBC WITH DIFFERENTIAL/PLATELET
HCT: 26.7 % — ABNORMAL LOW (ref 34.8–46.6)
HGB: 8 g/dL — ABNORMAL LOW (ref 11.6–15.9)
MCH: 23.3 pg — ABNORMAL LOW (ref 25.1–34.0)
MCHC: 29.8 g/dL — ABNORMAL LOW (ref 31.5–36.0)
MCV: 78.4 fL — ABNORMAL LOW (ref 79.5–101.0)
Platelets: 56 10*3/uL — ABNORMAL LOW (ref 145–400)
RBC: 3.41 10*6/uL — ABNORMAL LOW (ref 3.70–5.45)
RDW: 18.6 % — ABNORMAL HIGH (ref 11.2–14.5)
WBC: 40.4 10*3/uL — ABNORMAL HIGH (ref 3.9–10.3)

## 2015-06-22 MED ORDER — HEPARIN SOD (PORK) LOCK FLUSH 100 UNIT/ML IV SOLN: 250.0000 [IU] | INTRAVENOUS | Status: DC | PRN

## 2015-06-22 MED ORDER — SODIUM CHLORIDE 0.9 % IJ SOLN: 3.0000 mL | INTRAMUSCULAR | Status: DC | PRN

## 2015-06-22 MED ORDER — ACETAMINOPHEN 325 MG PO TABS
650.0000 mg | ORAL_TABLET | Freq: Once | ORAL | Status: AC
  Administered 2015-06-22: 650 mg via ORAL
  Filled 2015-06-22: qty 2

## 2015-06-22 MED ORDER — DIPHENHYDRAMINE HCL 25 MG PO CAPS
25.0000 mg | ORAL_CAPSULE | Freq: Once | ORAL | Status: AC
  Administered 2015-06-22: 25 mg via ORAL
  Filled 2015-06-22: qty 1

## 2015-06-22 MED ORDER — SODIUM CHLORIDE 0.9 % IJ SOLN
10.0000 mL | INTRAMUSCULAR | Status: AC | PRN
  Administered 2015-06-22: 10 mL

## 2015-06-22 MED ORDER — SODIUM CHLORIDE 0.9 % IV SOLN
250.0000 mL | Freq: Once | INTRAVENOUS | Status: AC
  Administered 2015-06-22: 250 mL via INTRAVENOUS

## 2015-06-22 MED ORDER — HEPARIN SOD (PORK) LOCK FLUSH 100 UNIT/ML IV SOLN
500.0000 [IU] | Freq: Every day | INTRAVENOUS | Status: AC | PRN
  Administered 2015-06-22: 500 [IU]
  Filled 2015-06-22: qty 5

## 2015-06-22 NOTE — Telephone Encounter (Signed)
CBC. CMET faxed to [facility]

## 2015-06-22 NOTE — Progress Notes (Signed)
Pt to day hospital for transfusion of 2 units of PRBCs. Port accessed and deaccessed. No complications noted. A/O, ambulatory, in no apparent distress. Discharged to home.  MD:  [name], MD Diagnosis Association: MDS (myelodysplastic syndrome) ([place]) (238.75)  Blair Hailey, RN

## 2015-06-22 NOTE — Telephone Encounter (Signed)
Adjusted some of the time for currently scheduled lab/blood appointments and sent message to inf mgr/scheduler re adding blood 12/30 due to SCC full.  Also cxd 12/28 flush appointment - not needed (old). Spoke with patient re changes she is aware and also aware that we are still working on 12/30. Patient will check mychart for updates.

## 2015-06-22 NOTE — Assessment & Plan Note (Signed)
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 It is mild and there is little change compared from previous platelet count. The patient denies recent history of bleeding such as epistaxis, hematuria or hematochezia. She is asymptomatic from the thrombocytopenia. I will observe for now.  she does not require transfusion now.  If her platelet count is less than 10,000 or if the patient has active bleeding, she will receive 1 unit of a pheresis platelets.  I will add appointment on Fridays and she will come here twice a week in the future for transfusion support until her transplant.

## 2015-06-22 NOTE — Progress Notes (Signed)
[place] OFFICE PROGRESS NOTE  Patient Care Team: Jinny Sanders, MD as PCP - General (Family Medicine) [name], MD as Referring Physician (Hematology and Oncology) [name], MD as Consulting Physician (Internal Medicine) Truman Hayward, MD as Consulting Physician (Infectious Diseases) Deanne Coffer An Nila Nephew, MD as Consulting Physician (Hematology and Oncology)  SUMMARY OF ONCOLOGIC HISTORY: Oncology History   Multiple myeloma, Ig A Lambda, M spike 3.54 grams, Calcium 9.2, Creatinine 0.8, Beta 2 microglobulin 4.52, IgA 4840 mg/dL, lambda light chain 75.4, albumin 3.6, hemoglobin 9.7, platelet 115    Primary site: Multiple Myeloma   Staging method: AJCC 6th Edition   Clinical: Stage IIA signed by [name], MD on 11/07/2013  2:46 PM   Summary: Stage IIA        Multiple myeloma in remission ([place])   10/31/2013 Bone Marrow Biopsy Bone marrow biopsy confirmed multiple myeloma with 40% bone marrow involvement. Skeletal survey showed minimal lesions in her score with generalized demineralization   11/10/2013 - 02/13/2014 Chemotherapy The patient is started on induction chemotherapy with weekly dexamethasone 40 mg by mouth as well as Velcade subcutaneous injection on days 1, 4, 8 and 11. On 11/21/2013, she was started on monthly Zometa.   12/23/2013 Adverse Reaction The dose of Velcade was reduced due to thrombocytopenia.   01/28/2014 - 04/07/2014 Chemotherapy Revlimid is added. Treatment was discontinued due to lack of response.   02/24/2014 - 04/07/2014 Chemotherapy Due to worsening peripheral neuropathy, Velcade injection is changed to once a week. Revlimid was given 21 days on, 7 days off.   04/07/2014 - 04/10/2014 Chemotherapy Revlimid was discontinued due to lack of response. Chemotherapy was changed back to Velcade injection twice a week, 2 weeks on 1 week off. Her treatment was switched to to minimum response   04/20/2014 - 06/02/2014 Chemotherapy  chemotherapy is switched to Carfilzomib, Cytoxan and dexamethasone.   04/22/2014 Procedure she has placement of port for chemotherapy.   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 She returns for further follow-up. She is delighted to know that her brother is a full match. She is undergoing extensive evaluation at [facility] with plan for related matched allogenic stem cell transplant next month. In the meantime, she complained of excessive fatigue. She also complained of occasional gum bleeding and easy bruising. The patient denies any recent signs or symptoms of bleeding  such as spontaneous epistaxis, hematuria or hematochezia.  She will start on hydroxyurea a week ago again attempt to control her worsening leukocytosis.  REVIEW OF  SYSTEMS:   Constitutional: Denies fevers, chills or abnormal weight loss Eyes: Denies blurriness of vision Ears, nose, mouth, throat, and face: Denies mucositis or sore throat Respiratory: Denies cough, dyspnea or wheezes Cardiovascular: Denies palpitation, chest discomfort or lower extremity swelling Gastrointestinal:  Denies nausea, heartburn or change in bowel habits Skin: Denies abnormal skin rashes Lymphatics: Denies new lymphadenopathy Neurological:Denies numbness, tingling or new weaknesses Behavioral/Psych: Mood is stable, no new changes  All other systems were reviewed with the patient and are negative.  I have reviewed the past medical history, past surgical history, social history and family history with the patient and they are unchanged from previous note.  ALLERGIES:  is allergic to penicillins and sulfa antibiotics.  MEDICATIONS:  Current Outpatient Prescriptions  Medication Sig Dispense Refill  . acetaminophen (TYLENOL) 500 MG tablet Take 1,500 mg by mouth as needed for moderate pain, fever or headache.     Marland Kitchen acyclovir (ZOVIRAX) 400 MG tablet TAKE 1 TABLET (400 MG TOTAL) BY MOUTH 2 (TWO) TIMES DAILY. 180 tablet 3  . ALPRAZolam (XANAX) 0.5 MG tablet Take 1 tablet (0.5 mg total) by mouth 2 (two) times daily as needed for anxiety. 60 tablet 0  . calcium carbonate (TUMS EX) 750 MG chewable tablet Chew 1 tablet by mouth 3 (three) times daily as needed for heartburn.     . carvedilol (COREG) 6.25 MG tablet Take 0.5 tablets (3.125 mg total) by mouth 2 (two) times daily with a meal. 90 tablet 3  . cholecalciferol (VITAMIN D) 1000 UNITS tablet Take 1,000 Units by mouth daily.    Marland Kitchen docusate sodium (COLACE) 100 MG capsule Take 100 mg by mouth 2 (two) times daily as needed for mild constipation or moderate constipation.     . hydroxyurea (HYDREA) 500 MG capsule Take by mouth.    . loperamide (IMODIUM) 2 MG capsule Take by mouth as needed for diarrhea or loose stools.    . mirtazapine  (REMERON) 30 MG tablet Take 1 tablet (30 mg total) by mouth at bedtime. 90 tablet 3  . Multiple Vitamin (MULTIVITAMIN WITH MINERALS) TABS tablet Take 1 tablet by mouth daily.    Levin Erp SULFATE VAGINAL (TRIMO-SAN) 0.025 % GEL Place 1 application vaginally 2 (two) times a week.     No current facility-administered medications for this visit.    PHYSICAL EXAMINATION: ECOG PERFORMANCE STATUS: 1 - Symptomatic but completely ambulatory  Filed Vitals:   06/22/15 1002  BP: 146/55  Pulse: 98  Temp: 98 F (36.7 C)  Resp: 20   Filed Weights   06/22/15 1002  Weight: 153 lb 6.4 oz (69.582 kg)    GENERAL:alert, no distress and comfortable. She looks pale SKIN:  Noted multiple bruises. No petechiae. EYES: normal, Conjunctiva are pink and non-injected, sclera clear OROPHARYNX:no exudate, no erythema and lips, buccal mucosa, and tongue normal. Noted healed, and 1 petechiae spot on the hard palate  NECK: supple, thyroid normal size, non-tender, without nodularity LYMPH:  no palpable lymphadenopathy in the cervical, axillary or inguinal LUNGS: clear to auscultation and percussion with normal breathing effort HEART: regular rate & rhythm and no murmurs and no lower extremity edema ABDOMEN:abdomen soft, non-tender and normal bowel sounds Musculoskeletal:no cyanosis of digits and no clubbing  NEURO: alert & oriented x 3 with fluent speech, no focal motor/sensory deficits  LABORATORY DATA:  I  have reviewed the data as listed    Component Value Date/Time   NA 139 06/22/2015 0944   NA 139 01/03/2015 2021   K 3.9 06/22/2015 0944   K 4.0 01/03/2015 2021   CL 109 01/03/2015 2021   CO2 26 06/22/2015 0944   CO2 25 01/03/2015 2021   GLUCOSE 109 06/22/2015 0944   GLUCOSE 115* 01/03/2015 2021   BUN 10.4 06/22/2015 0944   BUN 13 01/03/2015 2021   CREATININE 1.0 06/22/2015 0944   CREATININE 0.68 01/03/2015 2021   CALCIUM 9.0 06/22/2015 0944   CALCIUM 8.3* 01/03/2015 2021   PROT 7.4  06/22/2015 0944   PROT 6.5 01/03/2015 2021   ALBUMIN 3.9 06/22/2015 0944   ALBUMIN 4.1 01/03/2015 2021   AST 26 06/22/2015 0944   AST 18 01/03/2015 2021   ALT 15 06/22/2015 0944   ALT 11* 01/03/2015 2021   ALKPHOS 55 06/22/2015 0944   ALKPHOS 47 01/03/2015 2021   BILITOT 0.48 06/22/2015 0944   BILITOT 0.6 01/03/2015 2021   GFRNONAA >60 01/03/2015 2021   GFRAA >60 01/03/2015 2021    No results found for: SPEP, UPEP  Lab Results  Component Value Date   WBC 40.4* 06/22/2015   NEUTROABS 25.0* 06/08/2015   HGB 8.0* 06/22/2015   HCT 26.7* 06/22/2015   MCV 78.4* 06/22/2015   PLT 56* 06/22/2015      Chemistry      Component Value Date/Time   NA 139 06/22/2015 0944   NA 139 01/03/2015 2021   K 3.9 06/22/2015 0944   K 4.0 01/03/2015 2021   CL 109 01/03/2015 2021   CO2 26 06/22/2015 0944   CO2 25 01/03/2015 2021   BUN 10.4 06/22/2015 0944   BUN 13 01/03/2015 2021   CREATININE 1.0 06/22/2015 0944   CREATININE 0.68 01/03/2015 2021      Component Value Date/Time   CALCIUM 9.0 06/22/2015 0944   CALCIUM 8.3* 01/03/2015 2021   ALKPHOS 55 06/22/2015 0944   ALKPHOS 47 01/03/2015 2021   AST 26 06/22/2015 0944   AST 18 01/03/2015 2021   ALT 15 06/22/2015 0944   ALT 11* 01/03/2015 2021   BILITOT 0.48 06/22/2015 0944   BILITOT 0.6 01/03/2015 2021     ASSESSMENT & PLAN:  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  Fortunately,  Her brother comes back as a perfect match  She is started on hydroxyurea for the past week due to worsening leukocytosis I will defer to [facility] for arranging for transplantation in the near future.  In the meantime, I will continue to provide supportive care.    Pancytopenia, acquired ([place]) We discussed some of the risks, benefits, and alternatives of blood transfusions. The patient is symptomatic from anemia and the hemoglobin level is critically low.  Some of the  side-effects to be expected including risks of transfusion reactions, chills, infection, syndrome of volume overload and risk of hospitalization from various reasons and the patient is willing to proceed and went ahead to sign consent today. Previously, I only give her 1 unit of blood transfusion. She did not find that helpful and have excessive fatigue. She would like to try 2 units of blood instead   Thrombocytopenia ([place]) The cause is related to MDS. It is mild and there is little change compared from previous platelet count. The patient denies recent history of bleeding such as epistaxis, hematuria or hematochezia. She is asymptomatic from the thrombocytopenia. I  will observe for now.  she does not require transfusion now.  If her platelet count is less than 10,000 or if the patient has active bleeding, she will receive 1 unit of a pheresis platelets.  I will add appointment on Fridays and she will come here twice a week in the future for transfusion support until her transplant.     No orders of the defined types were placed in this encounter.   All questions were answered. The patient knows to call the clinic with any problems, questions or concerns. No barriers to learning was detected. I spent 25 minutes counseling the patient face to face. The total time spent in the appointment was 30 minutes and more than 50% was on counseling and review of test results     [facility], [place], MD 06/22/2015 10:44 AM

## 2015-06-22 NOTE — Assessment & Plan Note (Signed)
We discussed some of the risks, benefits, and alternatives of blood transfusions. The patient is symptomatic from anemia and the hemoglobin level is critically low.  Some of the side-effects to be expected including risks of transfusion reactions, chills, infection, syndrome of volume overload and risk of hospitalization from various reasons and the patient is willing to proceed and went ahead to sign consent today. Previously, I only give her 1 unit of blood transfusion. She did not find that helpful and have excessive fatigue. She would like to try 2 units of blood instead

## 2015-06-22 NOTE — Assessment & Plan Note (Signed)
The patient has made informed decision to pursue allogeneic stem cell transplant.  Fortunately,  Her brother comes back as a perfect match  She is started on hydroxyurea for the past week due to worsening leukocytosis I will defer to [facility] for arranging for transplantation in the near future.  In the meantime, I will continue to provide supportive care.

## 2015-06-23 ENCOUNTER — Encounter: Payer: Self-pay | Admitting: *Deleted

## 2015-06-23 ENCOUNTER — Telehealth: Payer: Self-pay | Admitting: Hematology and Oncology

## 2015-06-23 ENCOUNTER — Other Ambulatory Visit: Payer: Medicare HMO

## 2015-06-23 ENCOUNTER — Ambulatory Visit: Payer: Medicare HMO | Admitting: Hematology and Oncology

## 2015-06-23 ENCOUNTER — Encounter: Payer: Self-pay | Admitting: Hematology and Oncology

## 2015-06-23 LAB — TYPE AND SCREEN
ABO/RH(D): O POS
Antibody Screen: NEGATIVE
Unit division: 0
Unit division: 0

## 2015-06-23 NOTE — Telephone Encounter (Signed)
Patient called this morning and changed time the time of 1/10 lab/blood to 11:30 am.

## 2015-06-23 NOTE — Telephone Encounter (Signed)
Per desk nurse patient called and states she sees that her blood for 12/30 has shown up on mychart but no lab. Per desk nurse she told patient to come in for lab @ 9:15 am. Blood added by inf scheduler and I added lab for 9:15 am 12/30 - patient aware.

## 2015-06-24 ENCOUNTER — Other Ambulatory Visit: Payer: Self-pay | Admitting: Hematology and Oncology

## 2015-06-24 DIAGNOSIS — Z0189 Encounter for other specified special examinations: Secondary | ICD-10-CM | POA: Diagnosis not present

## 2015-06-24 DIAGNOSIS — C9000 Multiple myeloma not having achieved remission: Secondary | ICD-10-CM | POA: Diagnosis not present

## 2015-06-24 DIAGNOSIS — I517 Cardiomegaly: Secondary | ICD-10-CM | POA: Diagnosis not present

## 2015-06-24 DIAGNOSIS — D469 Myelodysplastic syndrome, unspecified: Secondary | ICD-10-CM | POA: Diagnosis not present

## 2015-06-24 DIAGNOSIS — Z79899 Other long term (current) drug therapy: Secondary | ICD-10-CM | POA: Diagnosis not present

## 2015-06-25 ENCOUNTER — Ambulatory Visit: Payer: Medicare HMO

## 2015-06-25 ENCOUNTER — Other Ambulatory Visit (HOSPITAL_BASED_OUTPATIENT_CLINIC_OR_DEPARTMENT_OTHER): Payer: Medicare HMO

## 2015-06-25 DIAGNOSIS — D4621 Refractory anemia with excess of blasts 1: Secondary | ICD-10-CM

## 2015-06-25 DIAGNOSIS — D4620 Refractory anemia with excess of blasts, unspecified: Secondary | ICD-10-CM

## 2015-06-25 DIAGNOSIS — D469 Myelodysplastic syndrome, unspecified: Secondary | ICD-10-CM

## 2015-06-25 LAB — COMPREHENSIVE METABOLIC PANEL
ALT: 11 U/L (ref 0–55)
AST: 20 U/L (ref 5–34)
Albumin: 3.9 g/dL (ref 3.5–5.0)
Alkaline Phosphatase: 55 U/L (ref 40–150)
Anion Gap: 8 mEq/L (ref 3–11)
BUN: 12.6 mg/dL (ref 7.0–26.0)
CO2: 26 mEq/L (ref 22–29)
Calcium: 8.8 mg/dL (ref 8.4–10.4)
Chloride: 104 mEq/L (ref 98–109)
Creatinine: 1 mg/dL (ref 0.6–1.1)
EGFR: 60 mL/min/{1.73_m2} — ABNORMAL LOW (ref >90)
Glucose: 105 mg/dl (ref 70–140)
Potassium: 4.2 mEq/L (ref 3.5–5.1)
Sodium: 139 mEq/L (ref 136–145)
Total Bilirubin: 0.52 mg/dL (ref 0.20–1.20)
Total Protein: 7.3 g/dL (ref 6.4–8.3)

## 2015-06-25 LAB — CBC WITH DIFFERENTIAL/PLATELET
HCT: 32.8 % — ABNORMAL LOW (ref 34.8–46.6)
HGB: 10.3 g/dL — ABNORMAL LOW (ref 11.6–15.9)
MCH: 25.9 pg (ref 25.1–34.0)
MCHC: 31.4 g/dL — ABNORMAL LOW (ref 31.5–36.0)
MCV: 82.4 fL (ref 79.5–101.0)
Platelets: 23 10e3/uL — ABNORMAL LOW (ref 145–400)
RBC: 3.98 10e6/uL (ref 3.70–5.45)
RDW: 17.8 % — ABNORMAL HIGH (ref 11.2–14.5)
WBC: 23.8 10e3/uL — ABNORMAL HIGH (ref 3.9–10.3)

## 2015-06-25 LAB — MANUAL DIFFERENTIAL
ALC: 5.2 10*3/uL — ABNORMAL HIGH (ref 0.9–3.3)
ANC (CHCC manual diff): 15.2 10*3/uL — ABNORMAL HIGH (ref 1.5–6.5)
Band Neutrophils: 5 % (ref 0–10)
Basophil: 0 % (ref 0–2)
Blasts: 2 % — ABNORMAL HIGH (ref 0–0)
EOS: 1 % (ref 0–7)
LYMPH: 22 % (ref 14–49)
MONO: 11 % (ref 0–14)
Metamyelocytes: 6 % — ABNORMAL HIGH (ref 0–0)
Myelocytes: 5 % — ABNORMAL HIGH (ref 0–0)
Other Cell: 0 % (ref 0–0)
PLT EST: DECREASED
PROMYELO: 0 % (ref 0–0)
SEG: 48 % (ref 38–77)
Variant Lymph: 0 % (ref 0–0)
nRBC: 0 % (ref 0–0)

## 2015-06-25 LAB — HOLD TUBE, BLOOD BANK

## 2015-06-25 NOTE — Progress Notes (Signed)
Labs reviewed by Dr. Alvy Bimler; no blood or platelets needed today.  Informed pt in lobby; gave copy of lab results.  Pt reports no bleeding at this time; Pt stated " port accessed at [facility] yesterday and it did bleed some but eventually stopped" Pt verbalized understanding to call for any bleeding/problems over weekend.

## 2015-06-29 ENCOUNTER — Encounter: Payer: Self-pay | Admitting: Hematology and Oncology

## 2015-06-29 ENCOUNTER — Ambulatory Visit (HOSPITAL_COMMUNITY)
Admission: RE | Admit: 2015-06-29 | Discharge: 2015-06-29 | Disposition: A | Discharge status: Home / Self Care | Payer: Medicare HMO | Source: Ambulatory Visit | Attending: Hematology and Oncology | Admitting: Hematology and Oncology

## 2015-06-29 DIAGNOSIS — D696 Thrombocytopenia, unspecified: Secondary | ICD-10-CM | POA: Insufficient documentation

## 2015-06-29 DIAGNOSIS — D469 Myelodysplastic syndrome, unspecified: Secondary | ICD-10-CM | POA: Insufficient documentation

## 2015-06-30 ENCOUNTER — Other Ambulatory Visit: Payer: Self-pay | Admitting: Hematology and Oncology

## 2015-06-30 ENCOUNTER — Telehealth: Payer: Self-pay | Admitting: *Deleted

## 2015-06-30 ENCOUNTER — Ambulatory Visit (HOSPITAL_BASED_OUTPATIENT_CLINIC_OR_DEPARTMENT_OTHER): Payer: Medicare HMO | Admitting: Hematology and Oncology

## 2015-06-30 ENCOUNTER — Encounter: Payer: Self-pay | Admitting: Hematology and Oncology

## 2015-06-30 ENCOUNTER — Telehealth: Payer: Self-pay | Admitting: Hematology and Oncology

## 2015-06-30 ENCOUNTER — Other Ambulatory Visit (HOSPITAL_BASED_OUTPATIENT_CLINIC_OR_DEPARTMENT_OTHER): Payer: Medicare HMO

## 2015-06-30 VITALS — BP 157/72 | HR 102 | Temp 98.0°F | Resp 18 | Ht 63.0 in | Wt 152.6 lb

## 2015-06-30 DIAGNOSIS — D4620 Refractory anemia with excess of blasts, unspecified: Secondary | ICD-10-CM

## 2015-06-30 DIAGNOSIS — D696 Thrombocytopenia, unspecified: Secondary | ICD-10-CM

## 2015-06-30 DIAGNOSIS — C9001 Multiple myeloma in remission: Secondary | ICD-10-CM | POA: Diagnosis not present

## 2015-06-30 DIAGNOSIS — D61818 Other pancytopenia: Secondary | ICD-10-CM

## 2015-06-30 DIAGNOSIS — D469 Myelodysplastic syndrome, unspecified: Secondary | ICD-10-CM

## 2015-06-30 DIAGNOSIS — J329 Chronic sinusitis, unspecified: Secondary | ICD-10-CM

## 2015-06-30 LAB — COMPREHENSIVE METABOLIC PANEL
ALT: 12 U/L (ref 0–55)
AST: 21 U/L (ref 5–34)
Albumin: 3.8 g/dL (ref 3.5–5.0)
Alkaline Phosphatase: 57 U/L (ref 40–150)
Anion Gap: 9 mEq/L (ref 3–11)
BUN: 14.4 mg/dL (ref 7.0–26.0)
CO2: 25 mEq/L (ref 22–29)
Calcium: 9 mg/dL (ref 8.4–10.4)
Chloride: 104 mEq/L (ref 98–109)
Creatinine: 0.9 mg/dL (ref 0.6–1.1)
EGFR: 67 mL/min/{1.73_m2} — ABNORMAL LOW (ref >90)
Glucose: 99 mg/dl (ref 70–140)
Potassium: 4.3 mEq/L (ref 3.5–5.1)
Sodium: 138 mEq/L (ref 136–145)
Total Bilirubin: 0.48 mg/dL (ref 0.20–1.20)
Total Protein: 7.6 g/dL (ref 6.4–8.3)

## 2015-06-30 LAB — HOLD TUBE, BLOOD BANK

## 2015-06-30 LAB — MANUAL DIFFERENTIAL
ALC: 3.9 10*3/uL — ABNORMAL HIGH (ref 0.9–3.3)
ANC (CHCC manual diff): 15.5 10*3/uL — ABNORMAL HIGH (ref 1.5–6.5)
Band Neutrophils: 6 % (ref 0–10)
Basophil: 0 % (ref 0–2)
Blasts: 1 % — ABNORMAL HIGH (ref 0–0)
EOS: 1 % (ref 0–7)
LYMPH: 18 % (ref 14–49)
MONO: 7 % (ref 0–14)
Metamyelocytes: 7 % — ABNORMAL HIGH (ref 0–0)
Myelocytes: 7 % — ABNORMAL HIGH (ref 0–0)
Other Cell: 0 % (ref 0–0)
PLT EST: DECREASED
PROMYELO: 1 % — ABNORMAL HIGH (ref 0–0)
SEG: 52 % (ref 38–77)
Variant Lymph: 0 % (ref 0–0)
nRBC: 1 % — ABNORMAL HIGH (ref 0–0)

## 2015-06-30 LAB — CBC WITH DIFFERENTIAL/PLATELET
HCT: 30.9 % — ABNORMAL LOW (ref 34.8–46.6)
HGB: 9.8 g/dL — ABNORMAL LOW (ref 11.6–15.9)
MCH: 25.9 pg (ref 25.1–34.0)
MCHC: 31.7 g/dL (ref 31.5–36.0)
MCV: 81.7 fL (ref 79.5–101.0)
Platelets: 28 10*3/uL — ABNORMAL LOW (ref 145–400)
RBC: 3.78 10*6/uL (ref 3.70–5.45)
RDW: 17.8 % — ABNORMAL HIGH (ref 11.2–14.5)
WBC: 21.6 10*3/uL — ABNORMAL HIGH (ref 3.9–10.3)

## 2015-06-30 NOTE — Assessment & Plan Note (Signed)
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 It is mild and there is little change compared from previous platelet count. The patient denies recent history of bleeding such as epistaxis, hematuria or hematochezia. She is asymptomatic from the thrombocytopenia. I will observe for now.  she does not require transfusion now.  If her platelet count is less than 10,000 or if the patient has active bleeding, she will receive 1 unit of a pheresis platelets.  I will add appointment on Fridays and she will come here twice a week in the future for transfusion support until her transplant.

## 2015-06-30 NOTE — Telephone Encounter (Signed)
Dr Alvy Bimler added in per pof

## 2015-06-30 NOTE — Assessment & Plan Note (Signed)
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 She is asymptomatic from the anemia. We will observe for now.  She does not require transfusion now.  She will receive 2 units of irradiated blood whenever her hemoglobin is less than 8 g.

## 2015-06-30 NOTE — Telephone Encounter (Signed)
Pt aware of MD visit added today.  She is leaving her house now.

## 2015-06-30 NOTE — Progress Notes (Signed)
[place] OFFICE PROGRESS NOTE  Patient Care Team: Jinny Sanders, MD as PCP - General (Family Medicine) [name], MD as Referring Physician (Hematology and Oncology) [name], MD as Consulting Physician (Internal Medicine) Truman Hayward, MD as Consulting Physician (Infectious Diseases) Deanne Coffer An Nila Nephew, MD as Consulting Physician (Hematology and Oncology)  SUMMARY OF ONCOLOGIC HISTORY: Oncology History   Multiple myeloma, Ig A Lambda, M spike 3.54 grams, Calcium 9.2, Creatinine 0.8, Beta 2 microglobulin 4.52, IgA 4840 mg/dL, lambda light chain 75.4, albumin 3.6, hemoglobin 9.7, platelet 115    Primary site: Multiple Myeloma   Staging method: AJCC 6th Edition   Clinical: Stage IIA signed by [name], MD on 11/07/2013  2:46 PM   Summary: Stage IIA        Multiple myeloma in remission ([place])   10/31/2013 Bone Marrow Biopsy Bone marrow biopsy confirmed multiple myeloma with 40% bone marrow involvement. Skeletal survey showed minimal lesions in her score with generalized demineralization   11/10/2013 - 02/13/2014 Chemotherapy The patient is started on induction chemotherapy with weekly dexamethasone 40 mg by mouth as well as Velcade subcutaneous injection on days 1, 4, 8 and 11. On 11/21/2013, she was started on monthly Zometa.   12/23/2013 Adverse Reaction The dose of Velcade was reduced due to thrombocytopenia.   01/28/2014 - 04/07/2014 Chemotherapy Revlimid is added. Treatment was discontinued due to lack of response.   02/24/2014 - 04/07/2014 Chemotherapy Due to worsening peripheral neuropathy, Velcade injection is changed to once a week. Revlimid was given 21 days on, 7 days off.   04/07/2014 - 04/10/2014 Chemotherapy Revlimid was discontinued due to lack of response. Chemotherapy was changed back to Velcade injection twice a week, 2 weeks on 1 week off. Her treatment was switched to to minimum response   04/20/2014 - 06/02/2014 Chemotherapy  chemotherapy is switched to Carfilzomib, Cytoxan and dexamethasone.   04/22/2014 Procedure she has placement of port for chemotherapy.   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 [facility]   10/20/2014 - 03/24/2015 Chemotherapy  she received chemotherapy with Kyprolis, Revlimid and dexamethasone   10/22/2014 Procedure She has port placement   01/19/2015 Tumor Marker IgA lambda M spike at 0.4 g    01/20/2015 Miscellaneous IVIG monthly was added for recurrent infections   02/02/2015 Miscellaneous She received GCSF for severe neutropenia   02/26/2015 Bone Marrow Biopsy  she had bone marrow biopsy done at [facility] which showed mild pancytopenia but not diagnostic for myelodysplastic syndrome or multiple myeloma    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)   04/06/2015 Bone Marrow Biopsy Accession: [id number] BM biopsy showed RAEB-1   04/06/2015 Tumor Marker Cytogenetics and FISH for MDS are within normal limits    INTERVAL HISTORY: Please see below for problem oriented charting. She returns today because of concern for persistent upper respiratory tract infection with nasal drainage and congestion. She denies fevers or chills. She rarely coughs if any. No sore throat. She complained of fatigue. She had occasional gum bleeding but not severe. She is awaiting bone marrow transplant. She is concerned about abnormal results from [place] recently.  REVIEW OF SYSTEMS:   Constitutional:  Denies fevers, chills or abnormal weight loss Eyes: Denies blurriness of vision Ears, nose, mouth, throat, and face: Denies mucositis or sore throat Respiratory:  Denies cough, dyspnea or wheezes Cardiovascular: Denies palpitation, chest discomfort or lower extremity swelling Gastrointestinal:  Denies nausea, heartburn or change in bowel habits Skin: Denies abnormal skin rashes Lymphatics: Denies new lymphadenopathy Neurological:Denies numbness, tingling or new weaknesses Behavioral/Psych: Mood is stable, no new changes  All other systems were reviewed with the patient and are negative.  I have reviewed the past medical history, past surgical history, social history and family history with the patient and they are unchanged from previous note.  ALLERGIES:  is allergic to penicillins and sulfa antibiotics.  MEDICATIONS:  Current Outpatient Prescriptions  Medication Sig Dispense Refill  . acetaminophen (TYLENOL) 500 MG tablet Take 1,500 mg by mouth as needed for moderate pain, fever or headache.     Marland Kitchen acyclovir (ZOVIRAX) 400 MG tablet TAKE 1 TABLET (400 MG TOTAL) BY MOUTH 2 (TWO) TIMES DAILY. 180 tablet 3  . ALPRAZolam (XANAX) 0.5 MG tablet Take 1 tablet (0.5 mg total) by mouth 2 (two) times daily as needed for anxiety. 60 tablet 0  . calcium carbonate (TUMS EX) 750 MG chewable tablet Chew 1 tablet by mouth 3 (three) times daily as needed for heartburn.     . carvedilol (COREG) 6.25 MG tablet Take 0.5 tablets (3.125 mg total) by mouth 2 (two) times daily with a meal. 90 tablet 3  . cholecalciferol (VITAMIN D) 1000 UNITS tablet Take 1,000 Units by mouth daily.    Marland Kitchen docusate sodium (COLACE) 100 MG capsule Take 100 mg by mouth 2 (two) times daily as needed for mild constipation or moderate constipation.     . hydroxyurea (HYDREA) 500 MG capsule Take by mouth.    . loperamide (IMODIUM) 2 MG capsule Take by mouth as needed for diarrhea or loose stools.    . mirtazapine (REMERON) 30 MG tablet Take 1 tablet (30 mg total) by mouth at bedtime. 90 tablet 3  . Multiple Vitamin (MULTIVITAMIN WITH MINERALS) TABS tablet Take 1 tablet by mouth daily.    Levin Erp SULFATE VAGINAL (TRIMO-SAN) 0.025 % GEL Place 1 application vaginally 2 (two) times a week.     No current facility-administered medications for this visit.    PHYSICAL EXAMINATION: ECOG PERFORMANCE STATUS: 1 - Symptomatic but completely ambulatory  Filed Vitals:   06/30/15 1024  BP: 157/72  Pulse: 102  Temp: 98 F (36.7 C)  Resp: 18   Filed Weights   06/30/15 1024  Weight: 152 lb 9.6 oz (69.219 kg)    GENERAL:alert, no distress and comfortable SKIN: No skin bruises. No petechiae. EYES: normal, Conjunctiva are pink and non-injected, sclera clear OROPHARYNX: Mild gingival hypertrophy and mild discoloration on her gumline. Musculoskeletal:no cyanosis of digits and no clubbing  NEURO: alert & oriented x 3 with fluent speech, no focal motor/sensory deficits  LABORATORY DATA:  I have reviewed the data as listed    Component Value Date/Time   NA 138 06/30/2015 0951   NA 139 01/03/2015 2021   K 4.3 06/30/2015 0951   K 4.0 01/03/2015 2021   CL 109 01/03/2015 2021   CO2 25 06/30/2015 0951   CO2 25 01/03/2015 2021   GLUCOSE 99 06/30/2015 0951   GLUCOSE 115* 01/03/2015 2021   BUN 14.4 06/30/2015 0951   BUN 13 01/03/2015 2021   CREATININE 0.9 06/30/2015 0951   CREATININE 0.68 01/03/2015 2021   CALCIUM 9.0 06/30/2015  0951   CALCIUM 8.3* 01/03/2015 2021   PROT 7.6 06/30/2015 0951   PROT 6.5 01/03/2015 2021   ALBUMIN 3.8 06/30/2015 0951   ALBUMIN 4.1 01/03/2015 2021   AST 21 06/30/2015 0951   AST 18 01/03/2015 2021   ALT 12 06/30/2015 0951   ALT 11* 01/03/2015 2021   ALKPHOS 57 06/30/2015 0951   ALKPHOS 47 01/03/2015 2021   BILITOT 0.48 06/30/2015 0951   BILITOT 0.6 01/03/2015 2021   GFRNONAA >60 01/03/2015 2021   GFRAA >60 01/03/2015 2021    No results found for: SPEP, UPEP  Lab Results  Component Value Date   WBC 21.6* 06/30/2015   NEUTROABS 25.0* 06/08/2015   HGB 9.8* 06/30/2015   HCT 30.9* 06/30/2015   MCV 81.7 06/30/2015   PLT 28* 06/30/2015       Chemistry      Component Value Date/Time   NA 138 06/30/2015 0951   NA 139 01/03/2015 2021   K 4.3 06/30/2015 0951   K 4.0 01/03/2015 2021   CL 109 01/03/2015 2021   CO2 25 06/30/2015 0951   CO2 25 01/03/2015 2021   BUN 14.4 06/30/2015 0951   BUN 13 01/03/2015 2021   CREATININE 0.9 06/30/2015 0951   CREATININE 0.68 01/03/2015 2021      Component Value Date/Time   CALCIUM 9.0 06/30/2015 0951   CALCIUM 8.3* 01/03/2015 2021   ALKPHOS 57 06/30/2015 0951   ALKPHOS 47 01/03/2015 2021   AST 21 06/30/2015 0951   AST 18 01/03/2015 2021   ALT 12 06/30/2015 0951   ALT 11* 01/03/2015 2021   BILITOT 0.48 06/30/2015 0951   BILITOT 0.6 01/03/2015 2021      ASSESSMENT & PLAN:   Multiple myeloma in remission ([place]) I reviewed her most recent blood work from [facility]. It appears that she may have early signs of disease relapse Her IgA level is very high along with elevated lambda light chain. The M spike was not very impressive. Her IgG level is adequate. She has a bone marrow biopsy pending at [place] this week I would defer to them for further management.  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 It is mild and there is little change compared from previous platelet count. The patient denies recent history of bleeding such as epistaxis, hematuria or hematochezia. She is asymptomatic from the thrombocytopenia. I will observe for now.  she does not require transfusion now.  If her platelet count is less than 10,000 or if the patient has active bleeding, she will receive 1 unit of a pheresis platelets.  I will add appointment on Fridays and she will come here twice a week in the future for transfusion support until her transplant.  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 She is asymptomatic from the anemia. We will observe for now.  She does not require transfusion now.  She will receive 2 units of irradiated blood whenever her hemoglobin is  less than 8 g.    Chronic recurrent sinusitis The patient had recurrent sinus congestion. Examination is quite benign and I suspect this is viral in nature. Her recent IgG level was adequate. I recommend she continues conservative management with Nasacort and over-the-counter decongestant. There is nothing to suggest active bacterial infection and I would not prescribe antibiotics for now.     All questions were answered. The patient knows to call the clinic with any problems, questions or concerns. No barriers to learning was detected. I  spent 15 minutes counseling the patient face to face. The total time spent in the appointment was 20 minutes and more than 50% was on counseling and review of test results     [facility], [place], MD 06/30/2015 11:32 AM

## 2015-06-30 NOTE — Assessment & Plan Note (Signed)
The patient had recurrent sinus congestion. Examination is quite benign and I suspect this is viral in nature. Her recent IgG level was adequate. I recommend she continues conservative management with Nasacort and over-the-counter decongestant. There is nothing to suggest active bacterial infection and I would not prescribe antibiotics for now.

## 2015-06-30 NOTE — Assessment & Plan Note (Signed)
I reviewed her most recent blood work from [facility]. It appears that she may have early signs of disease relapse Her IgA level is very high along with elevated lambda light chain. The M spike was not very impressive. Her IgG level is adequate. She has a bone marrow biopsy pending at [facility] this week I would defer to them for further management.

## 2015-07-01 ENCOUNTER — Encounter: Payer: Self-pay | Admitting: Hematology and Oncology

## 2015-07-01 ENCOUNTER — Telehealth: Payer: Self-pay | Admitting: *Deleted

## 2015-07-01 DIAGNOSIS — D469 Myelodysplastic syndrome, unspecified: Secondary | ICD-10-CM | POA: Diagnosis not present

## 2015-07-01 DIAGNOSIS — Z79899 Other long term (current) drug therapy: Secondary | ICD-10-CM | POA: Diagnosis not present

## 2015-07-01 NOTE — Telephone Encounter (Signed)
"  I come twice a week for lab.  Was there yesterday, [facility] today.  Today, 15 vials of blood drawn at [facility].  [facility] does not need labs tomorrow.  Returning to [facility] Monday and will call to let you know about 07-06-2015 appointments.  Today's HGB = 9.5, Pltc = 32, WBC = 18.  Instructed to stop the Hydroxyurea.  please let Dr. Alvy Bimler know to cancel tomorrow's lab and transfusion appointments."

## 2015-07-01 NOTE — Telephone Encounter (Signed)
Pls cancel her appt tomorrow

## 2015-07-02 ENCOUNTER — Other Ambulatory Visit: Payer: Medicare HMO

## 2015-07-05 ENCOUNTER — Telehealth: Payer: Self-pay | Admitting: *Deleted

## 2015-07-05 DIAGNOSIS — D479 Neoplasm of uncertain behavior of lymphoid, hematopoietic and related tissue, unspecified: Secondary | ICD-10-CM | POA: Diagnosis not present

## 2015-07-05 DIAGNOSIS — D4620 Refractory anemia with excess of blasts, unspecified: Secondary | ICD-10-CM | POA: Diagnosis not present

## 2015-07-05 DIAGNOSIS — C9001 Multiple myeloma in remission: Secondary | ICD-10-CM | POA: Diagnosis not present

## 2015-07-05 DIAGNOSIS — R0981 Nasal congestion: Secondary | ICD-10-CM | POA: Diagnosis not present

## 2015-07-05 DIAGNOSIS — Z79899 Other long term (current) drug therapy: Secondary | ICD-10-CM | POA: Diagnosis not present

## 2015-07-05 DIAGNOSIS — J3489 Other specified disorders of nose and nasal sinuses: Secondary | ICD-10-CM | POA: Diagnosis not present

## 2015-07-05 DIAGNOSIS — D469 Myelodysplastic syndrome, unspecified: Secondary | ICD-10-CM | POA: Diagnosis not present

## 2015-07-05 DIAGNOSIS — Z52001 Unspecified donor, stem cells: Secondary | ICD-10-CM | POA: Diagnosis not present

## 2015-07-05 DIAGNOSIS — D47Z9 Other specified neoplasms of uncertain behavior of lymphoid, hematopoietic and related tissue: Secondary | ICD-10-CM | POA: Diagnosis not present

## 2015-07-05 DIAGNOSIS — C9000 Multiple myeloma not having achieved remission: Secondary | ICD-10-CM | POA: Diagnosis not present

## 2015-07-05 DIAGNOSIS — R05 Cough: Secondary | ICD-10-CM | POA: Diagnosis not present

## 2015-07-05 DIAGNOSIS — D72829 Elevated white blood cell count, unspecified: Secondary | ICD-10-CM | POA: Diagnosis not present

## 2015-07-05 NOTE — Telephone Encounter (Signed)
Per voicemail message, patient canceled appts for tomorrow

## 2015-07-06 ENCOUNTER — Other Ambulatory Visit: Payer: Medicare HMO

## 2015-07-07 ENCOUNTER — Other Ambulatory Visit: Payer: Medicare HMO

## 2015-07-07 ENCOUNTER — Encounter: Payer: Self-pay | Admitting: Hematology and Oncology

## 2015-07-08 ENCOUNTER — Telehealth: Payer: Self-pay | Admitting: *Deleted

## 2015-07-08 ENCOUNTER — Encounter: Payer: Self-pay | Admitting: Hematology and Oncology

## 2015-07-08 NOTE — Telephone Encounter (Signed)
Pt left VM states she has been diagnosed w/ influenza and was recommended she see provider here.  She also sent my chart message;   "Hi Dr. Waymond Cera Levada Dy Minor at [facility] this morning before 4 am and she just responded at 3:53 telling me I should see someone when I at [facility] for labs tomorrow.   Still experiencing nasal congestion, achy body, occasional fever; she did tell me I have Influenza B. Taking Tamiflu, Z pack and Mucinex DM. No appetite, runs, body ache; problems sleeping since I must breathe through my mouth as both nostrils are plugged up.   Can you squeeze me in tomorrow morning? I have labs at 8 I think and potential blood products at 9:15 I think.   Thanks   Velna Ochs "

## 2015-07-09 ENCOUNTER — Ambulatory Visit (HOSPITAL_BASED_OUTPATIENT_CLINIC_OR_DEPARTMENT_OTHER): Payer: Medicare HMO | Admitting: Hematology and Oncology

## 2015-07-09 ENCOUNTER — Other Ambulatory Visit: Payer: Self-pay | Admitting: Hematology and Oncology

## 2015-07-09 ENCOUNTER — Ambulatory Visit (HOSPITAL_BASED_OUTPATIENT_CLINIC_OR_DEPARTMENT_OTHER): Payer: Medicare HMO

## 2015-07-09 ENCOUNTER — Other Ambulatory Visit (HOSPITAL_BASED_OUTPATIENT_CLINIC_OR_DEPARTMENT_OTHER): Payer: Medicare HMO

## 2015-07-09 ENCOUNTER — Other Ambulatory Visit: Payer: Self-pay | Admitting: *Deleted

## 2015-07-09 ENCOUNTER — Encounter: Payer: Self-pay | Admitting: Hematology and Oncology

## 2015-07-09 VITALS — BP 133/52 | HR 88 | Temp 99.7°F | Resp 20

## 2015-07-09 VITALS — BP 139/73 | HR 115 | Resp 20 | Wt 149.8 lb

## 2015-07-09 DIAGNOSIS — D469 Myelodysplastic syndrome, unspecified: Secondary | ICD-10-CM

## 2015-07-09 DIAGNOSIS — D61818 Other pancytopenia: Secondary | ICD-10-CM

## 2015-07-09 DIAGNOSIS — J111 Influenza due to unidentified influenza virus with other respiratory manifestations: Secondary | ICD-10-CM | POA: Insufficient documentation

## 2015-07-09 DIAGNOSIS — D4621 Refractory anemia with excess of blasts 1: Secondary | ICD-10-CM | POA: Diagnosis not present

## 2015-07-09 DIAGNOSIS — D696 Thrombocytopenia, unspecified: Secondary | ICD-10-CM | POA: Diagnosis not present

## 2015-07-09 DIAGNOSIS — D4620 Refractory anemia with excess of blasts, unspecified: Secondary | ICD-10-CM

## 2015-07-09 DIAGNOSIS — C9002 Multiple myeloma in relapse: Secondary | ICD-10-CM | POA: Diagnosis not present

## 2015-07-09 LAB — MANUAL DIFFERENTIAL
ALC: 4.7 10*3/uL — ABNORMAL HIGH (ref 0.9–3.3)
ANC (CHCC manual diff): 18.9 10*3/uL — ABNORMAL HIGH (ref 1.5–6.5)
Band Neutrophils: 12 % — ABNORMAL HIGH (ref 0–10)
Basophil: 1 % (ref 0–2)
Blasts: 1 % — ABNORMAL HIGH (ref 0–0)
EOS: 1 % (ref 0–7)
LYMPH: 18 % (ref 14–49)
MONO: 4 % (ref 0–14)
Metamyelocytes: 10 % — ABNORMAL HIGH (ref 0–0)
Myelocytes: 10 % — ABNORMAL HIGH (ref 0–0)
PLT EST: DECREASED
PROMYELO: 2 % — ABNORMAL HIGH (ref 0–0)
SEG: 41 % (ref 38–77)

## 2015-07-09 LAB — CBC WITH DIFFERENTIAL/PLATELET
HCT: 26.6 % — ABNORMAL LOW (ref 34.8–46.6)
HGB: 8.3 g/dL — ABNORMAL LOW (ref 11.6–15.9)
MCH: 25.5 pg (ref 25.1–34.0)
MCHC: 31.2 g/dL — ABNORMAL LOW (ref 31.5–36.0)
MCV: 81.6 fL (ref 79.5–101.0)
Platelets: 23 10*3/uL — ABNORMAL LOW (ref 145–400)
RBC: 3.26 10*6/uL — ABNORMAL LOW (ref 3.70–5.45)
RDW: 18.1 % — ABNORMAL HIGH (ref 11.2–14.5)
WBC: 25.9 10*3/uL — ABNORMAL HIGH (ref 3.9–10.3)

## 2015-07-09 LAB — COMPREHENSIVE METABOLIC PANEL
ALT: 11 U/L (ref 0–55)
AST: 21 U/L (ref 5–34)
Albumin: 3.7 g/dL (ref 3.5–5.0)
Alkaline Phosphatase: 48 U/L (ref 40–150)
Anion Gap: 11 mEq/L (ref 3–11)
BUN: 11.4 mg/dL (ref 7.0–26.0)
CO2: 24 mEq/L (ref 22–29)
Calcium: 8.3 mg/dL — ABNORMAL LOW (ref 8.4–10.4)
Chloride: 104 mEq/L (ref 98–109)
Creatinine: 0.9 mg/dL (ref 0.6–1.1)
EGFR: 67 mL/min/{1.73_m2} — ABNORMAL LOW (ref >90)
Glucose: 128 mg/dl (ref 70–140)
Potassium: 3.3 mEq/L — ABNORMAL LOW (ref 3.5–5.1)
Sodium: 139 mEq/L (ref 136–145)
Total Bilirubin: 0.45 mg/dL (ref 0.20–1.20)
Total Protein: 7.4 g/dL (ref 6.4–8.3)

## 2015-07-09 LAB — PREPARE RBC (CROSSMATCH)

## 2015-07-09 MED ORDER — DIPHENHYDRAMINE HCL 25 MG PO CAPS
ORAL_CAPSULE | ORAL | Status: AC
  Filled 2015-07-09: qty 1

## 2015-07-09 MED ORDER — SODIUM CHLORIDE 0.9 % IJ SOLN
10.0000 mL | INTRAMUSCULAR | Status: AC | PRN
  Administered 2015-07-09: 10 mL
  Filled 2015-07-09: qty 10

## 2015-07-09 MED ORDER — ACETAMINOPHEN 325 MG PO TABS
650.0000 mg | ORAL_TABLET | Freq: Once | ORAL | Status: AC
  Administered 2015-07-09: 650 mg via ORAL

## 2015-07-09 MED ORDER — ACETAMINOPHEN 325 MG PO TABS
ORAL_TABLET | ORAL | Status: AC
  Filled 2015-07-09: qty 2

## 2015-07-09 MED ORDER — DIPHENHYDRAMINE HCL 25 MG PO CAPS
25.0000 mg | ORAL_CAPSULE | Freq: Once | ORAL | Status: AC
  Administered 2015-07-09: 25 mg via ORAL

## 2015-07-09 MED ORDER — HEPARIN SOD (PORK) LOCK FLUSH 100 UNIT/ML IV SOLN
500.0000 [IU] | Freq: Every day | INTRAVENOUS | Status: AC | PRN
  Administered 2015-07-09: 500 [IU]
  Filled 2015-07-09: qty 5

## 2015-07-09 MED ORDER — SODIUM CHLORIDE 0.9 % IV SOLN
250.0000 mL | Freq: Once | INTRAVENOUS | Status: AC
  Administered 2015-07-09: 250 mL via INTRAVENOUS

## 2015-07-09 NOTE — Assessment & Plan Note (Signed)
She is upset that her brother may have abnormal EKG and that might prohibit him from being her donor.  he is undergoing further evaluation prior to stem cell transplant.

## 2015-07-09 NOTE — Assessment & Plan Note (Addendum)
She is currently being treated for influenza infection. Her symptoms as improved. Continue to same.

## 2015-07-09 NOTE — Patient Instructions (Signed)

## 2015-07-09 NOTE — Assessment & Plan Note (Signed)
She is not symptomatic. She is awaiting stem cell transplant for this.

## 2015-07-09 NOTE — Telephone Encounter (Signed)
I have no openings Just add her on when she comes in I placed POF

## 2015-07-09 NOTE — Assessment & Plan Note (Signed)
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 She is asymptomatic from the anemia. We will observe for now.  She does not require transfusion now.  She will receive 2 units of irradiated blood whenever her hemoglobin is less than 8 g.

## 2015-07-09 NOTE — Progress Notes (Signed)
[place] OFFICE PROGRESS NOTE  Patient Care Team: Jinny Sanders, MD as PCP - General (Family Medicine) [name], MD as Referring Physician (Hematology and Oncology) [name], MD as Consulting Physician (Internal Medicine) Truman Hayward, MD as Consulting Physician (Infectious Diseases) Deanne Coffer An Nila Nephew, MD as Consulting Physician (Hematology and Oncology)  SUMMARY OF ONCOLOGIC HISTORY: Oncology History   Multiple myeloma, Ig A Lambda, M spike 3.54 grams, Calcium 9.2, Creatinine 0.8, Beta 2 microglobulin 4.52, IgA 4840 mg/dL, lambda light chain 75.4, albumin 3.6, hemoglobin 9.7, platelet 115    Primary site: Multiple Myeloma   Staging method: AJCC 6th Edition   Clinical: Stage IIA signed by [name], MD on 11/07/2013  2:46 PM   Summary: Stage IIA        Multiple myeloma in relapse ([place])   10/31/2013 Bone Marrow Biopsy Bone marrow biopsy confirmed multiple myeloma with 40% bone marrow involvement. Skeletal survey showed minimal lesions in her score with generalized demineralization   11/10/2013 - 02/13/2014 Chemotherapy The patient is started on induction chemotherapy with weekly dexamethasone 40 mg by mouth as well as Velcade subcutaneous injection on days 1, 4, 8 and 11. On 11/21/2013, she was started on monthly Zometa.   12/23/2013 Adverse Reaction The dose of Velcade was reduced due to thrombocytopenia.   01/28/2014 - 04/07/2014 Chemotherapy Revlimid is added. Treatment was discontinued due to lack of response.   02/24/2014 - 04/07/2014 Chemotherapy Due to worsening peripheral neuropathy, Velcade injection is changed to once a week. Revlimid was given 21 days on, 7 days off.   04/07/2014 - 04/10/2014 Chemotherapy Revlimid was discontinued due to lack of response. Chemotherapy was changed back to Velcade injection twice a week, 2 weeks on 1 week off. Her treatment was switched to to minimum response   04/20/2014 - 06/02/2014 Chemotherapy  chemotherapy is switched to Carfilzomib, Cytoxan and dexamethasone.   04/22/2014 Procedure she has placement of port for chemotherapy.   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 [facility]   10/20/2014 - 03/24/2015 Chemotherapy  she received chemotherapy with Kyprolis, Revlimid and dexamethasone   10/22/2014 Procedure She has port placement   01/19/2015 Tumor Marker IgA lambda M spike at 0.4 g    01/20/2015 Miscellaneous IVIG monthly was added for recurrent infections   02/02/2015 Miscellaneous She received GCSF for severe neutropenia   02/26/2015 Bone Marrow Biopsy  she had bone marrow biopsy done at [facility] which showed mild pancytopenia but not diagnostic for myelodysplastic syndrome or multiple myeloma    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  she is seen urgently today because of recent diagnosis of influenza. Her symptoms has improved since she was started on treatment. She is upset that her brother have abnormal EKG. He is not symptomatic. Her brother is her donor.  REVIEW OF SYSTEMS:   Constitutional: Denies fevers, chills or abnormal weight loss Eyes: Denies blurriness of vision Ears, nose, mouth, throat, and  face: Denies mucositis or sore throat Respiratory: Denies cough, dyspnea or wheezes Cardiovascular: Denies palpitation, chest discomfort or lower extremity swelling Gastrointestinal:  Denies  nausea, heartburn or change in bowel habits Skin: Denies abnormal skin rashes Lymphatics: Denies new lymphadenopathy  Neurological:Denies numbness, tingling or new weaknesses Behavioral/Psych: Mood is stable, no new changes  All other systems were reviewed with the patient and are negative.  I have reviewed the past medical history, past surgical history, social history and family history with the patient and they are unchanged from previous note.  ALLERGIES:  is allergic to penicillins and sulfa antibiotics.  MEDICATIONS:  Current Outpatient Prescriptions  Medication Sig Dispense Refill  . acetaminophen (TYLENOL) 500 MG tablet Take 1,500 mg by mouth as needed for moderate pain, fever or headache.     . Azithromycin (ZITHROMAX Z-PAK PO) Take by mouth.    Marland Kitchen guaiFENesin (MUCINEX) 600 MG 12 hr tablet Take by mouth 2 (two) times daily.    . Oseltamivir Phosphate (TAMIFLU PO) Take by mouth.    Marland Kitchen acyclovir (ZOVIRAX) 400 MG tablet TAKE 1 TABLET (400 MG TOTAL) BY MOUTH 2 (TWO) TIMES DAILY. 180 tablet 3  . ALPRAZolam (XANAX) 0.5 MG tablet Take 1 tablet (0.5 mg total) by mouth 2 (two) times daily as needed for anxiety. 60 tablet 0  . calcium carbonate (TUMS EX) 750 MG chewable tablet Chew 1 tablet by mouth 3 (three) times daily as needed for heartburn.     . carvedilol (COREG) 6.25 MG tablet Take 0.5 tablets (3.125 mg total) by mouth 2 (two) times daily with a meal. 90 tablet 3  . cholecalciferol (VITAMIN D) 1000 UNITS tablet Take 1,000 Units by mouth daily.    Marland Kitchen docusate sodium (COLACE) 100 MG capsule Take 100 mg by mouth 2 (two) times daily as needed for mild constipation or moderate constipation.     . hydroxyurea (HYDREA) 500 MG capsule Take by mouth. Reported on 07/09/2015    . loperamide (IMODIUM) 2 MG capsule Take by mouth as needed for diarrhea or loose stools.    . mirtazapine (REMERON) 30 MG tablet Take 1 tablet (30 mg total) by mouth at bedtime. 90 tablet 3  . Multiple Vitamin  (MULTIVITAMIN WITH MINERALS) TABS tablet Take 1 tablet by mouth daily.    Levin Erp SULFATE VAGINAL (TRIMO-SAN) 0.025 % GEL Place 1 application vaginally 2 (two) times a week.     No current facility-administered medications for this visit.    PHYSICAL EXAMINATION: ECOG PERFORMANCE STATUS: 1 - Symptomatic but completely ambulatory  Filed Vitals:   07/09/15 0838  BP: 139/73  Pulse: 115  Resp: 20   Filed Weights   07/09/15 0838  Weight: 149 lb 12.8 oz (67.949 kg)    GENERAL:alert, no distress and comfortable. She looks pale SKIN: skin color, texture, turgor are normal, no rashes or significant lesions EYES: normal, Conjunctiva are pink and non-injected, sclera clear OROPHARYNX:no exudate, no erythema and lips, buccal mucosa, and tongue normal  Musculoskeletal:no cyanosis of digits and no clubbing  NEURO: alert & oriented x 3 with fluent speech, no focal motor/sensory deficits  LABORATORY DATA:  I have reviewed the data as listed    Component Value Date/Time   NA 139 07/09/2015 0801   NA 139 01/03/2015 2021   K 3.3* 07/09/2015 0801   K 4.0 01/03/2015 2021   CL 109 01/03/2015 2021   CO2 24 07/09/2015 0801   CO2 25 01/03/2015 2021   GLUCOSE 128 07/09/2015 0801   GLUCOSE  115* 01/03/2015 2021   BUN 11.4 07/09/2015 0801   BUN 13 01/03/2015 2021   CREATININE 0.9 07/09/2015 0801   CREATININE 0.68 01/03/2015 2021   CALCIUM 8.3* 07/09/2015 0801   CALCIUM 8.3* 01/03/2015 2021   PROT 7.4 07/09/2015 0801   PROT 6.5 01/03/2015 2021   ALBUMIN 3.7 07/09/2015 0801   ALBUMIN 4.1 01/03/2015 2021   AST 21 07/09/2015 0801   AST 18 01/03/2015 2021   ALT 11 07/09/2015 0801   ALT 11* 01/03/2015 2021   ALKPHOS 48 07/09/2015 0801   ALKPHOS 47 01/03/2015 2021   BILITOT 0.45 07/09/2015 0801   BILITOT 0.6 01/03/2015 2021   GFRNONAA >60 01/03/2015 2021   GFRAA >60 01/03/2015 2021    No results found for: SPEP, UPEP  Lab Results  Component Value Date   WBC 25.9* 07/09/2015    NEUTROABS 25.0* 06/08/2015   HGB 8.3* 07/09/2015   HCT 26.6* 07/09/2015   MCV 81.6 07/09/2015   PLT 23* 07/09/2015      Chemistry      Component Value Date/Time   NA 139 07/09/2015 0801   NA 139 01/03/2015 2021   K 3.3* 07/09/2015 0801   K 4.0 01/03/2015 2021   CL 109 01/03/2015 2021   CO2 24 07/09/2015 0801   CO2 25 01/03/2015 2021   BUN 11.4 07/09/2015 0801   BUN 13 01/03/2015 2021   CREATININE 0.9 07/09/2015 0801   CREATININE 0.68 01/03/2015 2021      Component Value Date/Time   CALCIUM 8.3* 07/09/2015 0801   CALCIUM 8.3* 01/03/2015 2021   ALKPHOS 48 07/09/2015 0801   ALKPHOS 47 01/03/2015 2021   AST 21 07/09/2015 0801   AST 18 01/03/2015 2021   ALT 11 07/09/2015 0801   ALT 11* 01/03/2015 2021   BILITOT 0.45 07/09/2015 0801   BILITOT 0.6 01/03/2015 2021      ASSESSMENT & PLAN:  Multiple myeloma in relapse ([place])  She is not symptomatic. She is awaiting stem cell transplant for this.  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  he is undergoing further evaluation prior to stem cell transplant.   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 She is asymptomatic from the anemia. We will observe for now.  She does not require transfusion now.  She will receive 2 units of irradiated blood whenever her hemoglobin is less than 8 g.      Influenza caused by unspecified influenza virus  She is currently being treated for influenza infection. Her symptoms as improved. Continue to same.   for today, I will only give her 1 unit of blood as she is symptomatic.  All questions were answered. The patient knows to call the clinic with any problems, questions or concerns. No barriers to learning was detected. I spent 15 minutes counseling the patient face to face. The total time spent in the appointment was 20 minutes and more than 50% was on  counseling and review of test results     [facility], [place], MD 07/09/2015 5:33 PM

## 2015-07-09 NOTE — Progress Notes (Signed)
Pt tolerated 1 unit prbc wo any difficulties. Still has s/s of FLU. Congestion, achy body and low grade temp. Last 99.7 at this time. Pt does feel improved today.

## 2015-07-12 LAB — TYPE AND SCREEN
ABO/RH(D): O POS
Antibody Screen: NEGATIVE
Unit division: 0

## 2015-07-13 ENCOUNTER — Encounter: Payer: Self-pay | Admitting: *Deleted

## 2015-07-13 ENCOUNTER — Other Ambulatory Visit (HOSPITAL_BASED_OUTPATIENT_CLINIC_OR_DEPARTMENT_OTHER): Payer: Medicare HMO

## 2015-07-13 ENCOUNTER — Encounter: Payer: Self-pay | Admitting: Hematology and Oncology

## 2015-07-13 DIAGNOSIS — D469 Myelodysplastic syndrome, unspecified: Secondary | ICD-10-CM

## 2015-07-13 DIAGNOSIS — D4620 Refractory anemia with excess of blasts, unspecified: Secondary | ICD-10-CM | POA: Diagnosis not present

## 2015-07-13 LAB — MANUAL DIFFERENTIAL
ALC: 4.7 10*3/uL — ABNORMAL HIGH (ref 0.9–3.3)
ANC (CHCC manual diff): 17.3 10*3/uL — ABNORMAL HIGH (ref 1.5–6.5)
Band Neutrophils: 6 % (ref 0–10)
Blasts: 1 % — ABNORMAL HIGH (ref 0–0)
EOS: 1 % (ref 0–7)
LYMPH: 20 % (ref 14–49)
MONO: 4 % (ref 0–14)
Metamyelocytes: 12 % — ABNORMAL HIGH (ref 0–0)
Myelocytes: 12 % — ABNORMAL HIGH (ref 0–0)
PLT EST: DECREASED
PROMYELO: 1 % — ABNORMAL HIGH (ref 0–0)
SEG: 43 % (ref 38–77)
nRBC: 1 % — ABNORMAL HIGH (ref 0–0)

## 2015-07-13 LAB — COMPREHENSIVE METABOLIC PANEL
ALT: 9 U/L (ref 0–55)
AST: 18 U/L (ref 5–34)
Albumin: 3.6 g/dL (ref 3.5–5.0)
Alkaline Phosphatase: 46 U/L (ref 40–150)
Anion Gap: 10 mEq/L (ref 3–11)
BUN: 10 mg/dL (ref 7.0–26.0)
CO2: 28 mEq/L (ref 22–29)
Calcium: 8.6 mg/dL (ref 8.4–10.4)
Chloride: 103 mEq/L (ref 98–109)
Creatinine: 0.9 mg/dL (ref 0.6–1.1)
EGFR: 63 mL/min/{1.73_m2} — ABNORMAL LOW (ref >90)
Glucose: 107 mg/dl (ref 70–140)
Potassium: 3.2 mEq/L — ABNORMAL LOW (ref 3.5–5.1)
Sodium: 140 mEq/L (ref 136–145)
Total Bilirubin: 0.5 mg/dL (ref 0.20–1.20)
Total Protein: 7.3 g/dL (ref 6.4–8.3)

## 2015-07-13 LAB — CBC WITH DIFFERENTIAL/PLATELET
HCT: 29.9 % — ABNORMAL LOW (ref 34.8–46.6)
HGB: 9.5 g/dL — ABNORMAL LOW (ref 11.6–15.9)
MCH: 25.8 pg (ref 25.1–34.0)
MCHC: 31.9 g/dL (ref 31.5–36.0)
MCV: 80.9 fL (ref 79.5–101.0)
Platelets: 26 10*3/uL — ABNORMAL LOW (ref 145–400)
RBC: 3.7 10*6/uL (ref 3.70–5.45)
RDW: 17.7 % — ABNORMAL HIGH (ref 11.2–14.5)
WBC: 23.7 10*3/uL — ABNORMAL HIGH (ref 3.9–10.3)

## 2015-07-13 NOTE — Progress Notes (Signed)
Lab results faxed to [facility] fax [phone]

## 2015-07-14 ENCOUNTER — Other Ambulatory Visit: Payer: Medicare HMO

## 2015-07-16 ENCOUNTER — Telehealth: Payer: Self-pay | Admitting: Hematology and Oncology

## 2015-07-16 ENCOUNTER — Other Ambulatory Visit: Payer: Self-pay | Admitting: Hematology and Oncology

## 2015-07-16 ENCOUNTER — Encounter: Payer: Self-pay | Admitting: Hematology and Oncology

## 2015-07-16 ENCOUNTER — Other Ambulatory Visit (HOSPITAL_BASED_OUTPATIENT_CLINIC_OR_DEPARTMENT_OTHER): Payer: Medicare HMO

## 2015-07-16 ENCOUNTER — Encounter: Payer: Self-pay | Admitting: General Practice

## 2015-07-16 ENCOUNTER — Telehealth: Payer: Self-pay | Admitting: *Deleted

## 2015-07-16 DIAGNOSIS — D4620 Refractory anemia with excess of blasts, unspecified: Secondary | ICD-10-CM

## 2015-07-16 DIAGNOSIS — E876 Hypokalemia: Secondary | ICD-10-CM

## 2015-07-16 DIAGNOSIS — D469 Myelodysplastic syndrome, unspecified: Secondary | ICD-10-CM | POA: Diagnosis not present

## 2015-07-16 LAB — MANUAL DIFFERENTIAL
ALC: 2.8 10*3/uL (ref 0.9–3.3)
ANC (CHCC manual diff): 23.9 10*3/uL — ABNORMAL HIGH (ref 1.5–6.5)
Band Neutrophils: 10 % (ref 0–10)
Blasts: 1 % — ABNORMAL HIGH (ref 0–0)
EOS: 1 % (ref 0–7)
LYMPH: 10 % — ABNORMAL LOW (ref 14–49)
MONO: 2 % (ref 0–14)
Metamyelocytes: 20 % — ABNORMAL HIGH (ref 0–0)
Myelocytes: 20 % — ABNORMAL HIGH (ref 0–0)
PLT EST: DECREASED
PROMYELO: 1 % — ABNORMAL HIGH (ref 0–0)
SEG: 35 % — ABNORMAL LOW (ref 38–77)

## 2015-07-16 LAB — CBC WITH DIFFERENTIAL/PLATELET
HCT: 27.3 % — ABNORMAL LOW (ref 34.8–46.6)
HGB: 8.8 g/dL — ABNORMAL LOW (ref 11.6–15.9)
MCH: 26.6 pg (ref 25.1–34.0)
MCHC: 32.2 g/dL (ref 31.5–36.0)
MCV: 82.5 fL (ref 79.5–101.0)
Platelets: 23 10*3/uL — ABNORMAL LOW (ref 145–400)
RBC: 3.31 10*6/uL — ABNORMAL LOW (ref 3.70–5.45)
RDW: 17.7 % — ABNORMAL HIGH (ref 11.2–14.5)
WBC: 28.1 10*3/uL — ABNORMAL HIGH (ref 3.9–10.3)

## 2015-07-16 LAB — COMPREHENSIVE METABOLIC PANEL
ALT: <9 U/L (ref 0–55)
AST: 19 U/L (ref 5–34)
Albumin: 3.5 g/dL (ref 3.5–5.0)
Alkaline Phosphatase: 45 U/L (ref 40–150)
Anion Gap: 11 mEq/L (ref 3–11)
BUN: 9.6 mg/dL (ref 7.0–26.0)
CO2: 31 mEq/L — ABNORMAL HIGH (ref 22–29)
Calcium: 8.5 mg/dL (ref 8.4–10.4)
Chloride: 98 mEq/L (ref 98–109)
Creatinine: 0.9 mg/dL (ref 0.6–1.1)
EGFR: 63 mL/min/{1.73_m2} — ABNORMAL LOW (ref >90)
Glucose: 98 mg/dl (ref 70–140)
Potassium: 2.9 mEq/L — CL (ref 3.5–5.1)
Sodium: 140 mEq/L (ref 136–145)
Total Bilirubin: 0.47 mg/dL (ref 0.20–1.20)
Total Protein: 7.3 g/dL (ref 6.4–8.3)

## 2015-07-16 MED ORDER — POTASSIUM CHLORIDE CRYS ER 20 MEQ PO TBCR: 20.0000 meq | EXTENDED_RELEASE_TABLET | Freq: Two times a day (BID) | ORAL | Status: DC

## 2015-07-16 NOTE — Progress Notes (Signed)
Spiritual Care Note  Sat with Jean Davidson in lobby, providing opportunity for her to verbalize and process her upset at health/tx setbacks, as well as other concerns.  She is feeling very frustrated and disheartened.  Plan to send a handwritten note of encouragement for f/u support.    [place], [place], [facility] Pager [phone] Voicemail  [phone]

## 2015-07-16 NOTE — Telephone Encounter (Signed)
Added lab/blood as per 1/20 pof - gave patient avs report and appointments for January thru March. No f/u up at this time. Patient brought to scheduling from lab by desk nurse.

## 2015-07-16 NOTE — Telephone Encounter (Signed)
S/w pt in lobby.  Copy of CBC and CMET given.  Informed no need for transfusion today.  Her potassium is low today.  Dr. Alvy Bimler instructed for pt to take Potassium 20 Meq twice daily for 7 days.   Also eat potassium rich diet including sweet potatoes, avocado and fruit juices.   Pt verbalized understanding.  States has a lot of potassium pills left at home from [facility] so she probably will not need to pick up new Rx.   Informed pt Dr. Alvy Bimler ordered weekly lab/transfusion appts for Fridays and took pt to Scheduler to get scheduled. She c/o ongoing nasal congestion in spite of using mucinex and other otc cold remedies.  She denies any further fevers.  Instructed pt to continue OTC cold remedy, decongestants.  There is really nothing else to add but time for her to recover completely from the flu.  She verbalized understanding.

## 2015-07-20 DIAGNOSIS — C9000 Multiple myeloma not having achieved remission: Secondary | ICD-10-CM | POA: Diagnosis not present

## 2015-07-20 DIAGNOSIS — Z8579 Personal history of other malignant neoplasms of lymphoid, hematopoietic and related tissues: Secondary | ICD-10-CM | POA: Diagnosis not present

## 2015-07-20 DIAGNOSIS — D849 Immunodeficiency, unspecified: Secondary | ICD-10-CM | POA: Diagnosis not present

## 2015-07-20 DIAGNOSIS — R0981 Nasal congestion: Secondary | ICD-10-CM | POA: Diagnosis not present

## 2015-07-20 DIAGNOSIS — J3489 Other specified disorders of nose and nasal sinuses: Secondary | ICD-10-CM | POA: Diagnosis not present

## 2015-07-20 DIAGNOSIS — J322 Chronic ethmoidal sinusitis: Secondary | ICD-10-CM | POA: Diagnosis not present

## 2015-07-20 DIAGNOSIS — Z9481 Bone marrow transplant status: Secondary | ICD-10-CM | POA: Diagnosis not present

## 2015-07-22 ENCOUNTER — Telehealth: Payer: Self-pay | Admitting: *Deleted

## 2015-07-22 DIAGNOSIS — Z5112 Encounter for antineoplastic immunotherapy: Secondary | ICD-10-CM | POA: Diagnosis not present

## 2015-07-22 DIAGNOSIS — D72829 Elevated white blood cell count, unspecified: Secondary | ICD-10-CM | POA: Diagnosis not present

## 2015-07-22 DIAGNOSIS — D649 Anemia, unspecified: Secondary | ICD-10-CM | POA: Diagnosis not present

## 2015-07-22 DIAGNOSIS — Z79899 Other long term (current) drug therapy: Secondary | ICD-10-CM | POA: Diagnosis not present

## 2015-07-22 DIAGNOSIS — C9002 Multiple myeloma in relapse: Secondary | ICD-10-CM | POA: Diagnosis not present

## 2015-07-22 DIAGNOSIS — J329 Chronic sinusitis, unspecified: Secondary | ICD-10-CM | POA: Diagnosis not present

## 2015-07-22 DIAGNOSIS — D469 Myelodysplastic syndrome, unspecified: Secondary | ICD-10-CM | POA: Diagnosis not present

## 2015-07-22 NOTE — Telephone Encounter (Signed)
Patient called and stated, "I have a cold/ sinus infection and need to cancel my lab/ blood transfusion appointment for tomorrow." Appointments cancelled.

## 2015-07-23 ENCOUNTER — Other Ambulatory Visit: Payer: Medicare HMO

## 2015-07-23 DIAGNOSIS — C9002 Multiple myeloma in relapse: Secondary | ICD-10-CM | POA: Diagnosis not present

## 2015-07-26 ENCOUNTER — Other Ambulatory Visit: Payer: Self-pay | Admitting: Hematology and Oncology

## 2015-07-26 ENCOUNTER — Encounter: Payer: Self-pay | Admitting: Hematology and Oncology

## 2015-07-26 ENCOUNTER — Telehealth: Payer: Self-pay | Admitting: *Deleted

## 2015-07-26 ENCOUNTER — Telehealth: Payer: Self-pay | Admitting: Hematology and Oncology

## 2015-07-26 DIAGNOSIS — R300 Dysuria: Secondary | ICD-10-CM

## 2015-07-26 HISTORY — DX: Dysuria: R30.0

## 2015-07-26 NOTE — Telephone Encounter (Signed)
Patient would prefer to see you. What time is good.

## 2015-07-26 NOTE — Telephone Encounter (Signed)
I placed POF for labs and see me tomorrow at 1230 pm I did not order flush for port access for labs

## 2015-07-26 NOTE — Telephone Encounter (Signed)
She had recently completed Tamiflu and antibiotics  I doubt it is another infection again. Low grade fever could be related to recent treatment, not necessarily another infection I can see her tomorrow if she wants, otherwise she can be evaluated at [facility] during her Rx this week Also, since they are treating her now, can we cancel all her labs/transfusion appt?

## 2015-07-26 NOTE — Telephone Encounter (Signed)
Patient called stating that after her treatment at [facility] last week she has been experiencing fever on and off ranging from 99.0- 100.2. Patient complains of cough with no sign of mucous and a brown spot on the tongue which has now disappeared. Patient states that she took her temp this morning which was "almost 100" per patient. Patient wanted to get your opinion on this and to see you wanted to see her in your office. Message forwarded to MD Alvy Bimler.

## 2015-07-26 NOTE — Telephone Encounter (Signed)
Spoke with patient re appointments for 1/31.

## 2015-07-27 ENCOUNTER — Ambulatory Visit (HOSPITAL_BASED_OUTPATIENT_CLINIC_OR_DEPARTMENT_OTHER): Payer: Medicare HMO | Admitting: Hematology and Oncology

## 2015-07-27 ENCOUNTER — Encounter: Payer: Self-pay | Admitting: Hematology and Oncology

## 2015-07-27 ENCOUNTER — Other Ambulatory Visit (HOSPITAL_BASED_OUTPATIENT_CLINIC_OR_DEPARTMENT_OTHER): Payer: Medicare HMO

## 2015-07-27 ENCOUNTER — Ambulatory Visit (HOSPITAL_BASED_OUTPATIENT_CLINIC_OR_DEPARTMENT_OTHER): Payer: Medicare HMO

## 2015-07-27 ENCOUNTER — Other Ambulatory Visit: Payer: Self-pay | Admitting: Hematology and Oncology

## 2015-07-27 ENCOUNTER — Ambulatory Visit (HOSPITAL_COMMUNITY)
Admission: RE | Admit: 2015-07-27 | Discharge: 2015-07-27 | Disposition: A | Discharge status: Home / Self Care | Payer: Medicare HMO | Source: Ambulatory Visit | Attending: Hematology and Oncology | Admitting: Hematology and Oncology

## 2015-07-27 VITALS — BP 137/63 | HR 99 | Temp 98.7°F | Resp 18

## 2015-07-27 VITALS — BP 127/74 | HR 117 | Temp 98.4°F | Resp 19 | Wt 146.5 lb

## 2015-07-27 DIAGNOSIS — R300 Dysuria: Secondary | ICD-10-CM | POA: Diagnosis not present

## 2015-07-27 DIAGNOSIS — C9002 Multiple myeloma in relapse: Secondary | ICD-10-CM

## 2015-07-27 DIAGNOSIS — D4620 Refractory anemia with excess of blasts, unspecified: Secondary | ICD-10-CM | POA: Diagnosis not present

## 2015-07-27 DIAGNOSIS — R05 Cough: Secondary | ICD-10-CM | POA: Diagnosis not present

## 2015-07-27 DIAGNOSIS — D469 Myelodysplastic syndrome, unspecified: Secondary | ICD-10-CM

## 2015-07-27 DIAGNOSIS — J329 Chronic sinusitis, unspecified: Secondary | ICD-10-CM | POA: Diagnosis not present

## 2015-07-27 DIAGNOSIS — D61818 Other pancytopenia: Secondary | ICD-10-CM

## 2015-07-27 DIAGNOSIS — R5381 Other malaise: Secondary | ICD-10-CM | POA: Diagnosis not present

## 2015-07-27 DIAGNOSIS — D696 Thrombocytopenia, unspecified: Secondary | ICD-10-CM

## 2015-07-27 DIAGNOSIS — R509 Fever, unspecified: Secondary | ICD-10-CM | POA: Diagnosis not present

## 2015-07-27 DIAGNOSIS — B9689 Other specified bacterial agents as the cause of diseases classified elsewhere: Secondary | ICD-10-CM

## 2015-07-27 DIAGNOSIS — R059 Cough, unspecified: Secondary | ICD-10-CM | POA: Insufficient documentation

## 2015-07-27 DIAGNOSIS — A499 Bacterial infection, unspecified: Secondary | ICD-10-CM

## 2015-07-27 HISTORY — DX: Chronic sinusitis, unspecified: J32.9

## 2015-07-27 HISTORY — DX: Other specified bacterial agents as the cause of diseases classified elsewhere: B96.89

## 2015-07-27 HISTORY — DX: Cough, unspecified: R05.9

## 2015-07-27 HISTORY — DX: Fever, unspecified: R50.9

## 2015-07-27 LAB — URINALYSIS, MICROSCOPIC - CHCC
Bilirubin (Urine): NEGATIVE
Glucose: NEGATIVE mg/dL
Ketones: NEGATIVE mg/dL
Nitrite: NEGATIVE
Protein: 100 mg/dL
Specific Gravity, Urine: 1.02 (ref 1.003–1.035)
Urobilinogen, UR: 0.2 mg/dL (ref 0.2–1)
pH: 6 (ref 4.6–8.0)

## 2015-07-27 LAB — CBC WITH DIFFERENTIAL/PLATELET
BASO%: 0.2 % (ref 0.0–2.0)
Basophils Absolute: 0 10*3/uL (ref 0.0–0.1)
EOS%: 0.1 % (ref 0.0–7.0)
Eosinophils Absolute: 0 10*3/uL (ref 0.0–0.5)
HCT: 33.5 % — ABNORMAL LOW (ref 34.8–46.6)
HGB: 11.1 g/dL — ABNORMAL LOW (ref 11.6–15.9)
LYMPH%: 22.9 % (ref 14.0–49.7)
MCH: 27.4 pg (ref 25.1–34.0)
MCHC: 33.1 g/dL (ref 31.5–36.0)
MCV: 82.7 fL (ref 79.5–101.0)
MONO#: 0.8 10*3/uL (ref 0.1–0.9)
MONO%: 7.5 % (ref 0.0–14.0)
NEUT#: 7.1 10*3/uL — ABNORMAL HIGH (ref 1.5–6.5)
NEUT%: 69.3 % (ref 38.4–76.8)
Platelets: 16 10*3/uL — ABNORMAL LOW (ref 145–400)
RBC: 4.05 10*6/uL (ref 3.70–5.45)
RDW: 16.3 % — ABNORMAL HIGH (ref 11.2–14.5)
WBC: 10.3 10*3/uL (ref 3.9–10.3)
lymph#: 2.4 10*3/uL (ref 0.9–3.3)
nRBC: 1 % — ABNORMAL HIGH (ref 0–0)

## 2015-07-27 LAB — COMPREHENSIVE METABOLIC PANEL
ALT: 12 U/L (ref 0–55)
AST: 14 U/L (ref 5–34)
Albumin: 3.9 g/dL (ref 3.5–5.0)
Alkaline Phosphatase: 47 U/L (ref 40–150)
Anion Gap: 11 mEq/L (ref 3–11)
BUN: 22.2 mg/dL (ref 7.0–26.0)
CO2: 24 mEq/L (ref 22–29)
Calcium: 8.8 mg/dL (ref 8.4–10.4)
Chloride: 99 mEq/L (ref 98–109)
Creatinine: 1 mg/dL (ref 0.6–1.1)
EGFR: 60 mL/min/{1.73_m2} — ABNORMAL LOW (ref >90)
Glucose: 131 mg/dl (ref 70–140)
Potassium: 3.9 mEq/L (ref 3.5–5.1)
Sodium: 134 mEq/L — ABNORMAL LOW (ref 136–145)
Total Bilirubin: 0.81 mg/dL (ref 0.20–1.20)
Total Protein: 7.5 g/dL (ref 6.4–8.3)

## 2015-07-27 LAB — TECHNOLOGIST REVIEW

## 2015-07-27 MED ORDER — HEPARIN SOD (PORK) LOCK FLUSH 100 UNIT/ML IV SOLN
500.0000 [IU] | Freq: Every day | INTRAVENOUS | Status: AC | PRN
  Administered 2015-07-27: 500 [IU]
  Filled 2015-07-27: qty 5

## 2015-07-27 MED ORDER — DIPHENHYDRAMINE HCL 25 MG PO CAPS
25.0000 mg | ORAL_CAPSULE | Freq: Once | ORAL | Status: AC
  Administered 2015-07-27: 25 mg via ORAL

## 2015-07-27 MED ORDER — SODIUM CHLORIDE 0.9% FLUSH
10.0000 mL | INTRAVENOUS | Status: AC | PRN
  Administered 2015-07-27: 10 mL
  Filled 2015-07-27: qty 10

## 2015-07-27 MED ORDER — METHYLPREDNISOLONE SODIUM SUCC 40 MG IJ SOLR
INTRAMUSCULAR | Status: AC
  Filled 2015-07-27: qty 1

## 2015-07-27 MED ORDER — METHYLPREDNISOLONE SODIUM SUCC 40 MG IJ SOLR
40.0000 mg | Freq: Once | INTRAMUSCULAR | Status: AC
  Administered 2015-07-27: 40 mg via INTRAVENOUS

## 2015-07-27 MED ORDER — ACETAMINOPHEN 325 MG PO TABS
ORAL_TABLET | ORAL | Status: AC
  Filled 2015-07-27: qty 2

## 2015-07-27 MED ORDER — LEVOFLOXACIN 500 MG PO TABS: 500.0000 mg | ORAL_TABLET | Freq: Every day | ORAL | Status: DC

## 2015-07-27 MED ORDER — SODIUM CHLORIDE 0.9 % IV SOLN
250.0000 mL | Freq: Once | INTRAVENOUS | Status: AC
  Administered 2015-07-27: 250 mL via INTRAVENOUS

## 2015-07-27 MED ORDER — ACETAMINOPHEN 325 MG PO TABS
650.0000 mg | ORAL_TABLET | Freq: Once | ORAL | Status: AC
  Administered 2015-07-27: 650 mg via ORAL

## 2015-07-27 MED ORDER — DIPHENHYDRAMINE HCL 25 MG PO CAPS
ORAL_CAPSULE | ORAL | Status: AC
  Filled 2015-07-27: qty 1

## 2015-07-27 NOTE — Progress Notes (Signed)
[facility] OFFICE PROGRESS NOTE  Patient Care Team: Jinny Sanders, MD as PCP - General (Family Medicine) [name], MD as Referring Physician (Hematology and Oncology) [name], MD as Consulting Physician (Internal Medicine) Truman Hayward, MD as Consulting Physician (Infectious Diseases) Deanne Coffer An Nila Nephew, MD as Consulting Physician (Hematology and Oncology)  SUMMARY OF ONCOLOGIC HISTORY: Oncology History   Multiple myeloma, Ig A Lambda, M spike 3.54 grams, Calcium 9.2, Creatinine 0.8, Beta 2 microglobulin 4.52, IgA 4840 mg/dL, lambda light chain 75.4, albumin 3.6, hemoglobin 9.7, platelet 115    Primary site: Multiple Myeloma   Staging method: AJCC 6th Edition   Clinical: Stage IIA signed by [name], MD on 11/07/2013  2:46 PM   Summary: Stage IIA        Multiple myeloma in relapse ([place])   10/31/2013 Bone Marrow Biopsy Bone marrow biopsy confirmed multiple myeloma with 40% bone marrow involvement. Skeletal survey showed minimal lesions in her score with generalized demineralization   11/10/2013 - 02/13/2014 Chemotherapy The patient is started on induction chemotherapy with weekly dexamethasone 40 mg by mouth as well as Velcade subcutaneous injection on days 1, 4, 8 and 11. On 11/21/2013, she was started on monthly Zometa.   12/23/2013 Adverse Reaction The dose of Velcade was reduced due to thrombocytopenia.   01/28/2014 - 04/07/2014 Chemotherapy Revlimid is added. Treatment was discontinued due to lack of response.   02/24/2014 - 04/07/2014 Chemotherapy Due to worsening peripheral neuropathy, Velcade injection is changed to once a week. Revlimid was given 21 days on, 7 days off.   04/07/2014 - 04/10/2014 Chemotherapy Revlimid was discontinued due to lack of response. Chemotherapy was changed back to Velcade injection twice a week, 2 weeks on 1 week off. Her treatment was switched to to minimum response   04/20/2014 - 06/02/2014 Chemotherapy  chemotherapy is switched to Carfilzomib, Cytoxan and dexamethasone.   04/22/2014 Procedure she has placement of port for chemotherapy.   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 [facility]   10/20/2014 - 03/24/2015 Chemotherapy  she received chemotherapy with Kyprolis, Revlimid and dexamethasone   10/22/2014 Procedure She has port placement   01/19/2015 Tumor Marker IgA lambda M spike at 0.4 g    01/20/2015 Miscellaneous IVIG monthly was added for recurrent infections   02/02/2015 Miscellaneous She received GCSF for severe neutropenia   02/26/2015 Bone Marrow Biopsy  she had bone marrow biopsy done at [facility] which showed mild pancytopenia but not diagnostic for myelodysplastic syndrome or multiple myeloma   07/22/2015 -  Chemotherapy She is receiving Daratumumab at [facility] due to relapsed myeloma    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 She is seen urgently because of unresolved low-grade fever and left nostril bleeding. She had nosebleeds since early morning, on a continuous basis. She also, a low-grade fever of 100.5 with chills. She had recent CT scan done at [facility] which showed sinusitis. Last week, she received  new treatment at [facility] complicated by chills and coughing. Her treatment was interrupted but she did complete her treatment last week. She received 2 units of blood transfusion on 07/23/2015. She was  prescribed and not a course of azithromycin on 07/23/2015 with no signs of improvement She have some nausea and very poor appetite. She has lost a lot of weight since the last time I saw her due to poor appetite  REVIEW OF SYSTEMS:   Eyes: Denies blurriness of vision Ears, nose, mouth, throat, and face: Denies mucositis or sore throat Respiratory: Denies cough, dyspnea or wheezes Cardiovascular: Denies palpitation, chest discomfort or lower extremity swelling Gastrointestinal:  Denies nausea, heartburn or change in bowel habits Skin: Denies abnormal skin rashes Lymphatics: Denies new lymphadenopathy  Neurological:Denies numbness, tingling or new weaknesses Behavioral/Psych: Mood is stable, no new changes  All other systems were reviewed with the patient and are negative.  I have reviewed the past medical history, past surgical history, social history and family history with the patient and they are unchanged from previous note.  ALLERGIES:  is allergic to penicillins and sulfa antibiotics.  MEDICATIONS:  Current Outpatient Prescriptions  Medication Sig Dispense Refill  . acetaminophen (TYLENOL) 500 MG tablet Take 1,500 mg by mouth as needed for moderate pain, fever or headache.     Marland Kitchen acyclovir (ZOVIRAX) 400 MG tablet TAKE 1 TABLET (400 MG TOTAL) BY MOUTH 2 (TWO) TIMES DAILY. 180 tablet 3  . allopurinol (ZYLOPRIM) 300 MG tablet Take 300 mg by mouth daily.    Marland Kitchen ALPRAZolam (XANAX) 0.5 MG tablet Take 1 tablet (0.5 mg total) by mouth 2 (two) times daily as needed for anxiety. 60 tablet 0  . Azithromycin (ZITHROMAX Z-PAK PO) Take by mouth.    . calcium carbonate (TUMS EX) 750 MG chewable tablet Chew 1 tablet by mouth 3 (three) times daily as needed for heartburn.     . carvedilol (COREG) 6.25 MG tablet Take 0.5 tablets (3.125 mg total) by mouth 2 (two) times daily with a meal. 90 tablet 3  . cholecalciferol (VITAMIN D) 1000 UNITS tablet Take 1,000 Units by mouth daily.    Marland Kitchen docusate sodium  (COLACE) 100 MG capsule Take 100 mg by mouth 2 (two) times daily as needed for mild constipation or moderate constipation.     Marland Kitchen guaiFENesin (MUCINEX) 600 MG 12 hr tablet Take by mouth 2 (two) times daily.    Marland Kitchen loperamide (IMODIUM) 2 MG capsule Take by mouth as needed for diarrhea or loose stools.    . mirtazapine (REMERON) 30 MG tablet Take 1 tablet (30 mg total) by mouth at bedtime. 90 tablet 3  . Multiple Vitamin (MULTIVITAMIN WITH MINERALS) TABS tablet Take 1 tablet by mouth daily.    Levin Erp SULFATE VAGINAL (TRIMO-SAN) 0.025 % GEL Place 1 application vaginally 2 (two) times a week.    . predniSONE (DELTASONE) 10 MG tablet Take 30 mg by mouth. For two days following dara    . levofloxacin (LEVAQUIN) 500 MG tablet Take 1 tablet (500 mg total) by mouth daily. 10 tablet 0   No current facility-administered medications for this visit.    PHYSICAL EXAMINATION: ECOG PERFORMANCE STATUS: 1 - Symptomatic but completely ambulatory  Filed Vitals:   07/27/15 1218  BP: 127/74  Pulse: 117  Temp: 98.4 F (36.9 C)  Resp: 19   Filed Weights   07/27/15 1218  Weight: 146 lb 8 oz (66.452 kg)    GENERAL:alert, no distress and comfortable SKIN: skin color, texture,  turgor are normal, no rashes or significant lesions. Extensive bruises unnoted EYES: normal, Conjunctiva are pink and non-injected, sclera clear OROPHARYNX:no exudate, no erythema and lips, buccal mucosa, and tongue normal . Noted gum bleeding NECK: supple, thyroid normal size, non-tender, without nodularity LYMPH:  no palpable lymphadenopathy in the cervical, axillary or inguinal LUNGS: clear to auscultation and percussion with normal breathing effort HEART: regular rate & rhythm and no murmurs and no lower extremity edema ABDOMEN:abdomen soft, non-tender and normal bowel sounds Musculoskeletal:no cyanosis of digits and no clubbing  NEURO: alert & oriented x 3 with fluent speech, no focal motor/sensory deficits  LABORATORY  DATA:  I have reviewed the data as listed    Component Value Date/Time   NA 134* 07/27/2015 1158   NA 139 01/03/2015 2021   K 3.9 07/27/2015 1158   K 4.0 01/03/2015 2021   CL 109 01/03/2015 2021   CO2 24 07/27/2015 1158   CO2 25 01/03/2015 2021   GLUCOSE 131 07/27/2015 1158   GLUCOSE 115* 01/03/2015 2021   BUN 22.2 07/27/2015 1158   BUN 13 01/03/2015 2021   CREATININE 1.0 07/27/2015 1158   CREATININE 0.68 01/03/2015 2021   CALCIUM 8.8 07/27/2015 1158   CALCIUM 8.3* 01/03/2015 2021   PROT 7.5 07/27/2015 1158   PROT 6.5 01/03/2015 2021   ALBUMIN 3.9 07/27/2015 1158   ALBUMIN 4.1 01/03/2015 2021   AST 14 07/27/2015 1158   AST 18 01/03/2015 2021   ALT 12 07/27/2015 1158   ALT 11* 01/03/2015 2021   ALKPHOS 47 07/27/2015 1158   ALKPHOS 47 01/03/2015 2021   BILITOT 0.81 07/27/2015 1158   BILITOT 0.6 01/03/2015 2021   GFRNONAA >60 01/03/2015 2021   GFRAA >60 01/03/2015 2021    No results found for: SPEP, UPEP  Lab Results  Component Value Date   WBC 10.3 07/27/2015   NEUTROABS 7.1* 07/27/2015   HGB 11.1* 07/27/2015   HCT 33.5* 07/27/2015   MCV 82.7 07/27/2015   PLT 16* 07/27/2015      Chemistry      Component Value Date/Time   NA 134* 07/27/2015 1158   NA 139 01/03/2015 2021   K 3.9 07/27/2015 1158   K 4.0 01/03/2015 2021   CL 109 01/03/2015 2021   CO2 24 07/27/2015 1158   CO2 25 01/03/2015 2021   BUN 22.2 07/27/2015 1158   BUN 13 01/03/2015 2021   CREATININE 1.0 07/27/2015 1158   CREATININE 0.68 01/03/2015 2021      Component Value Date/Time   CALCIUM 8.8 07/27/2015 1158   CALCIUM 8.3* 01/03/2015 2021   ALKPHOS 47 07/27/2015 1158   ALKPHOS 47 01/03/2015 2021   AST 14 07/27/2015 1158   AST 18 01/03/2015 2021   ALT 12 07/27/2015 1158   ALT 11* 01/03/2015 2021   BILITOT 0.81 07/27/2015 1158   BILITOT 0.6 01/03/2015 2021       RADIOGRAPHIC STUDIES: I have personally reviewed the radiological images as listed and agreed with the findings in the  report. Dg Chest 2 View  07/27/2015  CLINICAL DATA:  New onset of cough and fever and malaise; history of multiple myeloma in remission. EXAM: CHEST  2 VIEW COMPARISON:  Chest x-ray of January 03, 2015 FINDINGS: The lungs are adequately inflated. There is no evidence of pneumonia. There is a stable 3 x 5 mm calcified nodule in the right lower lobe. The heart and pulmonary vascularity are normal. The mediastinum is normal in width. There is no pleural effusion or  pneumothorax. The bony thorax exhibits no acute abnormalities. There is stable increased density within the anterior aspect of the left seventh rib. Previous right shoulder arthroplasty. The power port appliance tip projects over the midportion of the SVC. IMPRESSION: There is no evidence of pneumonia nor other acute cardiopulmonary abnormality. Electronically Signed   By: David  Martinique M.D.   On: 07/27/2015 11:53     ASSESSMENT & PLAN:   Multiple myeloma in relapse [facility]) The patient had infusion reaction last week during treatment with Daratumumab. It is not uncommon. I will defer to [facility] for further treatment and follow-up. We'll continue to provide supportive care as needed.  MDS (myelodysplastic syndrome) ([facility]) Due to worsening pancytopenia, as per discussion with her physician at [facility]: Lambert Rhonda W, the patient is instructed to discontinue hydroxyurea. She will continue close follow-up at [facility] for future transplant  Pancytopenia, acquired [facility]) She has active bleeding.We discussed some of the risks, benefits, and alternatives of platelets transfusions. The patient is symptomatic from low platelet counts with bleeding/at high risk of life-threatening bleeding and the platelet count is critically low.  Some of the side-effects to be expected including risks of transfusion reactions, chills, infection, syndrome of volume overload and risk of hospitalization from various reasons and the patient is willing to proceed and went ahead to sign consent today. I  will proceed to give her 1 unit of platelet transfusion and which she will continue future blood work monitoring and transfusion at [facility].  Sinusitis, bacterial She had recent sinus CT at [facility] which show sinus opacification and signs of sinusitis. She was prescribed a second course of treatment with azithromycin starting 07/16/2015 with no improvement. She continues to have low-grade fever. Per discussion with the physician at [facility], I will switch her to levofloxacin. She has an appointment to see infectious disease team at [facility] in the near future     All questions were answered. The patient knows to call the clinic with any problems, questions or concerns. No barriers to learning was detected. I spent 30 minutes counseling the patient face to face. The total time spent in the appointment was 40 minutes and more than 50% was on counseling and review of test results     [facility], [place], MD 07/27/2015 5:02 PM

## 2015-07-27 NOTE — Patient Instructions (Signed)
Platelet Transfusion  A platelet transfusion is a procedure in which you receive donated platelets through an IV tube. Platelets are tiny pieces of blood cells. When a blood vessel is damaged, platelets collect in the damaged area to help form a blood clot. This begins the healing process. If your platelet count gets too low, your blood may have trouble clotting.  You may need a platelet transfusion if you have a condition that causes a low number of platelets (thrombocytopenia). A platelet transfusion may be used to stop or prevent bleeding.  LET YOUR HEALTH CARE PROVIDER KNOW ABOUT:   Any allergies you have.   All medicines you are taking, including vitamins, herbs, eye drops, creams, and over-the-counter medicines.   Previous problems you or members of your family have had with the use of anesthetics.   Any blood disorders you have.   Previous surgeries you have had.   Any medical conditions you may have.   Any reactions you have had during a previous transfusion. RISKS AND COMPLICATIONS Generally, this is a safe procedure. However, problems may occur, including:   Fever with or without chills. The fever usually occurs within the first 4 hours of the transfusion and returns to normal within 48 hours.  Allergic reaction. The reaction is most commonly caused by antibodies your body creates against substances in the transfusion. Signs of an allergic reaction may include itching, hives, difficulty breathing, shock, or low blood pressure.  Sudden (acute) or delayed hemolytic reaction. This rare reaction can occur during the transfusion and up to 28 days after the transfusion. The reaction usually occurs when your body's defense system (immune system) attacks the new platelets. Signs of a hemolytic reaction may include fever, headache, difficulty breathing, low blood pressure, a rapid heartbeat, or pain in your back, abdomen, chest, or IV site.  Transfusion-related acute lung injury  (TRALI). TRALI can occur within hours of a transfusion, or several days later. This is a rare reaction that causes lung damage. The cause is not known.  Infection. Signs of this rare complication may include fever, chills, vomiting, a rapid heartbeat, or low blood pressure. BEFORE THE PROCEDURE   You may have a blood test to determine your blood type. This is necessary to find out what kind ofplatelets best matches your platelets.  If you have had an allergic reaction to a transfusion in the past, you may be given medicine to help prevent a reaction. Take this medicine only as directed by your health care provider.  Your temperature, blood pressure, and pulse will be monitored before the transfusion. PROCEDURE  An IV will be started in your hand or arm.  The transfusion will be attached to your IV tubing. The bag of donated platelets will be attached to your IV tube andgiven into your vein.  Your temperature, blood pressure, and pulse will be monitored regularly during the transfusion. This monitoring is done to help detect early signs of a transfusion reaction.  If you have any signs or symptoms of a reaction, your transfusion will be stopped and you may be given medicine.  When your transfusion is complete, your IV will be removed.  Pressure may be applied to the IV site for a few minutes.  A bandage (dressing) will be applied. The procedure may vary among health care providers and hospitals. AFTER THE PROCEDURE  Your blood pressure, temperature, and pulse will be monitored regularly.   This information is not intended to replace advice given to you by your health   care provider. Make sure you discuss any questions you have with your health care provider.   Document Released: 04/09/2007 Document Revised: 07/03/2014 Document Reviewed: 04/22/2014 Elsevier Interactive Patient Education 2016 Elsevier Inc.  

## 2015-07-28 ENCOUNTER — Encounter: Payer: Self-pay | Admitting: Hematology and Oncology

## 2015-07-28 LAB — PREPARE PLATELET PHERESIS: Unit division: 0

## 2015-07-28 LAB — URINE CULTURE

## 2015-07-28 NOTE — Assessment & Plan Note (Signed)
The patient had infusion reaction last week during treatment with Daratumumab. It is not uncommon. I will defer to [facility] for further treatment and follow-up. We'll continue to provide supportive care as needed.

## 2015-07-28 NOTE — Assessment & Plan Note (Signed)
Due to worsening pancytopenia, as per discussion with her physician at [facility], the patient is instructed to discontinue hydroxyurea. She will continue close follow-up at [facility] for future transplant

## 2015-07-28 NOTE — Assessment & Plan Note (Signed)
She has active bleeding.We discussed some of the risks, benefits, and alternatives of platelets transfusions. The patient is symptomatic from low platelet counts with bleeding/at high risk of life-threatening bleeding and the platelet count is critically low.  Some of the side-effects to be expected including risks of transfusion reactions, chills, infection, syndrome of volume overload and risk of hospitalization from various reasons and the patient is willing to proceed and went ahead to sign consent today. I will proceed to give her 1 unit of platelet transfusion and which she will continue future blood work monitoring and transfusion at [facility].

## 2015-07-28 NOTE — Assessment & Plan Note (Signed)
She had recent sinus CT at [facility] which show sinus opacification and signs of sinusitis. She was prescribed a second course of treatment with azithromycin starting 07/16/2015 with no improvement. She continues to have low-grade fever. Per discussion with the physician at [facility], I will switch her to levofloxacin. She has an appointment to see infectious disease team at [facility] in the near future

## 2015-07-29 DIAGNOSIS — Z5112 Encounter for antineoplastic immunotherapy: Secondary | ICD-10-CM | POA: Diagnosis not present

## 2015-07-29 DIAGNOSIS — R768 Other specified abnormal immunological findings in serum: Secondary | ICD-10-CM | POA: Diagnosis not present

## 2015-07-29 DIAGNOSIS — C9002 Multiple myeloma in relapse: Secondary | ICD-10-CM | POA: Diagnosis not present

## 2015-07-30 ENCOUNTER — Other Ambulatory Visit: Payer: Self-pay | Admitting: *Deleted

## 2015-07-30 ENCOUNTER — Other Ambulatory Visit: Payer: Medicare HMO

## 2015-07-30 ENCOUNTER — Telehealth: Payer: Self-pay | Admitting: *Deleted

## 2015-07-30 DIAGNOSIS — R768 Other specified abnormal immunological findings in serum: Secondary | ICD-10-CM | POA: Insufficient documentation

## 2015-07-30 NOTE — Telephone Encounter (Signed)
Per staff message and POF I have scheduled appts. Advised scheduler of appts. JMW  

## 2015-07-30 NOTE — Telephone Encounter (Signed)
Advised scheduler of no available on 2/6. JMW

## 2015-08-02 ENCOUNTER — Other Ambulatory Visit: Payer: Medicare HMO

## 2015-08-02 DIAGNOSIS — C9202 Acute myeloblastic leukemia, in relapse: Secondary | ICD-10-CM | POA: Diagnosis not present

## 2015-08-02 DIAGNOSIS — R509 Fever, unspecified: Secondary | ICD-10-CM | POA: Diagnosis not present

## 2015-08-02 DIAGNOSIS — C9000 Multiple myeloma not having achieved remission: Secondary | ICD-10-CM | POA: Diagnosis not present

## 2015-08-02 DIAGNOSIS — J101 Influenza due to other identified influenza virus with other respiratory manifestations: Secondary | ICD-10-CM | POA: Diagnosis not present

## 2015-08-02 DIAGNOSIS — C9002 Multiple myeloma in relapse: Secondary | ICD-10-CM | POA: Diagnosis not present

## 2015-08-02 DIAGNOSIS — D849 Immunodeficiency, unspecified: Secondary | ICD-10-CM | POA: Diagnosis not present

## 2015-08-02 DIAGNOSIS — J019 Acute sinusitis, unspecified: Secondary | ICD-10-CM | POA: Diagnosis not present

## 2015-08-02 DIAGNOSIS — D469 Myelodysplastic syndrome, unspecified: Secondary | ICD-10-CM | POA: Diagnosis not present

## 2015-08-02 DIAGNOSIS — D479 Neoplasm of uncertain behavior of lymphoid, hematopoietic and related tissue, unspecified: Secondary | ICD-10-CM | POA: Diagnosis not present

## 2015-08-03 ENCOUNTER — Emergency Department (HOSPITAL_COMMUNITY): Payer: Medicare HMO

## 2015-08-03 ENCOUNTER — Encounter (HOSPITAL_COMMUNITY): Payer: Self-pay | Admitting: Family Medicine

## 2015-08-03 ENCOUNTER — Inpatient Hospital Stay (HOSPITAL_COMMUNITY)
Admission: EM | Admit: 2015-08-03 | Discharge: 2015-08-06 | DRG: 872 | Disposition: A | Discharge status: Home / Self Care | Payer: Medicare HMO | Attending: Internal Medicine | Admitting: Internal Medicine

## 2015-08-03 DIAGNOSIS — R509 Fever, unspecified: Secondary | ICD-10-CM | POA: Diagnosis present

## 2015-08-03 DIAGNOSIS — J329 Chronic sinusitis, unspecified: Secondary | ICD-10-CM | POA: Diagnosis present

## 2015-08-03 DIAGNOSIS — M6281 Muscle weakness (generalized): Secondary | ICD-10-CM | POA: Diagnosis not present

## 2015-08-03 DIAGNOSIS — Z79899 Other long term (current) drug therapy: Secondary | ICD-10-CM | POA: Diagnosis not present

## 2015-08-03 DIAGNOSIS — D61818 Other pancytopenia: Secondary | ICD-10-CM | POA: Diagnosis not present

## 2015-08-03 DIAGNOSIS — Z808 Family history of malignant neoplasm of other organs or systems: Secondary | ICD-10-CM

## 2015-08-03 DIAGNOSIS — Z66 Do not resuscitate: Secondary | ICD-10-CM | POA: Diagnosis present

## 2015-08-03 DIAGNOSIS — Z792 Long term (current) use of antibiotics: Secondary | ICD-10-CM | POA: Diagnosis not present

## 2015-08-03 DIAGNOSIS — M4802 Spinal stenosis, cervical region: Secondary | ICD-10-CM | POA: Diagnosis present

## 2015-08-03 DIAGNOSIS — M50823 Other cervical disc disorders at C6-C7 level: Secondary | ICD-10-CM | POA: Diagnosis present

## 2015-08-03 DIAGNOSIS — Z882 Allergy status to sulfonamides status: Secondary | ICD-10-CM

## 2015-08-03 DIAGNOSIS — C9000 Multiple myeloma not having achieved remission: Secondary | ICD-10-CM | POA: Diagnosis present

## 2015-08-03 DIAGNOSIS — K219 Gastro-esophageal reflux disease without esophagitis: Secondary | ICD-10-CM | POA: Diagnosis present

## 2015-08-03 DIAGNOSIS — D469 Myelodysplastic syndrome, unspecified: Secondary | ICD-10-CM | POA: Diagnosis present

## 2015-08-03 DIAGNOSIS — Z8249 Family history of ischemic heart disease and other diseases of the circulatory system: Secondary | ICD-10-CM

## 2015-08-03 DIAGNOSIS — Z9484 Stem cells transplant status: Secondary | ICD-10-CM | POA: Diagnosis not present

## 2015-08-03 DIAGNOSIS — Z7952 Long term (current) use of systemic steroids: Secondary | ICD-10-CM | POA: Diagnosis not present

## 2015-08-03 DIAGNOSIS — Z800 Family history of malignant neoplasm of digestive organs: Secondary | ICD-10-CM | POA: Diagnosis not present

## 2015-08-03 DIAGNOSIS — Z880 Allergy status to penicillin: Secondary | ICD-10-CM

## 2015-08-03 DIAGNOSIS — I10 Essential (primary) hypertension: Secondary | ICD-10-CM | POA: Diagnosis present

## 2015-08-03 DIAGNOSIS — Z87891 Personal history of nicotine dependence: Secondary | ICD-10-CM | POA: Diagnosis not present

## 2015-08-03 DIAGNOSIS — E876 Hypokalemia: Secondary | ICD-10-CM | POA: Diagnosis present

## 2015-08-03 DIAGNOSIS — R531 Weakness: Secondary | ICD-10-CM | POA: Diagnosis present

## 2015-08-03 DIAGNOSIS — F419 Anxiety disorder, unspecified: Secondary | ICD-10-CM | POA: Diagnosis present

## 2015-08-03 DIAGNOSIS — A419 Sepsis, unspecified organism: Secondary | ICD-10-CM | POA: Diagnosis not present

## 2015-08-03 DIAGNOSIS — C9002 Multiple myeloma in relapse: Secondary | ICD-10-CM | POA: Diagnosis not present

## 2015-08-03 DIAGNOSIS — C9001 Multiple myeloma in remission: Secondary | ICD-10-CM | POA: Diagnosis present

## 2015-08-03 DIAGNOSIS — R0602 Shortness of breath: Secondary | ICD-10-CM | POA: Diagnosis not present

## 2015-08-03 DIAGNOSIS — C946 Myelodysplastic disease, not classified: Secondary | ICD-10-CM | POA: Diagnosis present

## 2015-08-03 LAB — URINALYSIS, ROUTINE W REFLEX MICROSCOPIC
Bilirubin Urine: NEGATIVE
Glucose, UA: NEGATIVE mg/dL
Hgb urine dipstick: NEGATIVE
Ketones, ur: NEGATIVE mg/dL
Leukocytes, UA: NEGATIVE
Nitrite: NEGATIVE
Protein, ur: 100 mg/dL — AB
Specific Gravity, Urine: 1.019 (ref 1.005–1.030)
pH: 7.5 (ref 5.0–8.0)

## 2015-08-03 LAB — COMPREHENSIVE METABOLIC PANEL
ALT: 9 U/L — ABNORMAL LOW (ref 14–54)
AST: 15 U/L (ref 15–41)
Albumin: 3.8 g/dL (ref 3.5–5.0)
Alkaline Phosphatase: 29 U/L — ABNORMAL LOW (ref 38–126)
Anion gap: 9 (ref 5–15)
BUN: 16 mg/dL (ref 6–20)
CO2: 23 mmol/L (ref 22–32)
Calcium: 7.9 mg/dL — ABNORMAL LOW (ref 8.9–10.3)
Chloride: 97 mmol/L — ABNORMAL LOW (ref 101–111)
Creatinine, Ser: 0.91 mg/dL (ref 0.44–1.00)
GFR calc Af Amer: >60 mL/min (ref >60)
GFR calc non Af Amer: >60 mL/min (ref >60)
Glucose, Bld: 130 mg/dL — ABNORMAL HIGH (ref 65–99)
Potassium: 3.1 mmol/L — ABNORMAL LOW (ref 3.5–5.1)
Sodium: 129 mmol/L — ABNORMAL LOW (ref 135–145)
Total Bilirubin: 0.9 mg/dL (ref 0.3–1.2)
Total Protein: 6.3 g/dL — ABNORMAL LOW (ref 6.5–8.1)

## 2015-08-03 LAB — I-STAT CG4 LACTIC ACID, ED
Lactic Acid, Venous: 1.51 mmol/L (ref 0.5–2.0)
Lactic Acid, Venous: 3.34 mmol/L (ref 0.5–2.0)
Lactic Acid, Venous: 3.54 mmol/L (ref 0.5–2.0)

## 2015-08-03 LAB — URINE MICROSCOPIC-ADD ON

## 2015-08-03 MED ORDER — ACETAMINOPHEN 325 MG PO TABS
650.0000 mg | ORAL_TABLET | Freq: Once | ORAL | Status: AC
  Administered 2015-08-03: 650 mg via ORAL
  Filled 2015-08-03: qty 2

## 2015-08-03 MED ORDER — SODIUM CHLORIDE 0.9 % IV BOLUS (SEPSIS)
1000.0000 mL | Freq: Once | INTRAVENOUS | Status: AC
  Administered 2015-08-03: 1000 mL via INTRAVENOUS

## 2015-08-03 MED ORDER — VANCOMYCIN HCL IN DEXTROSE 1-5 GM/200ML-% IV SOLN
1000.0000 mg | Freq: Once | INTRAVENOUS | Status: AC
  Administered 2015-08-03: 1000 mg via INTRAVENOUS
  Filled 2015-08-03: qty 200

## 2015-08-03 MED ORDER — DEXTROSE 5 % IV SOLN
2.0000 g | Freq: Once | INTRAVENOUS | Status: AC
  Administered 2015-08-03: 2 g via INTRAVENOUS
  Filled 2015-08-03: qty 2

## 2015-08-03 MED ORDER — VANCOMYCIN HCL 500 MG IV SOLR
500.0000 mg | Freq: Two times a day (BID) | INTRAVENOUS | Status: DC
  Administered 2015-08-04 – 2015-08-05 (×4): 500 mg via INTRAVENOUS
  Filled 2015-08-03 (×5): qty 500

## 2015-08-03 MED ORDER — DEXTROSE 5 % IV SOLN
2.0000 g | Freq: Three times a day (TID) | INTRAVENOUS | Status: DC
  Administered 2015-08-04: 2 g via INTRAVENOUS
  Filled 2015-08-03 (×2): qty 2

## 2015-08-03 MED ORDER — LEVOFLOXACIN IN D5W 750 MG/150ML IV SOLN
750.0000 mg | Freq: Once | INTRAVENOUS | Status: AC
  Administered 2015-08-03: 750 mg via INTRAVENOUS
  Filled 2015-08-03: qty 150

## 2015-08-03 MED ORDER — SODIUM CHLORIDE 0.9 % IV BOLUS (SEPSIS)
1000.0000 mL | INTRAVENOUS | Status: AC
  Administered 2015-08-03 – 2015-08-04 (×3): 1000 mL via INTRAVENOUS

## 2015-08-03 MED ORDER — LEVOFLOXACIN IN D5W 750 MG/150ML IV SOLN: 750.0000 mg | INTRAVENOUS | Status: DC

## 2015-08-03 NOTE — Progress Notes (Signed)
ANTIBIOTIC CONSULT NOTE - INITIAL  Pharmacy Consult for Vancomycin, Aztreonam, Levofloxacin Indication: sepsis    Patient Measurements: Height: _0  (160 cm) Weight: 145 lb (65.772 kg) IBW/kg (Calculated) : 52.4  Vital Signs: Temp: 99.8 F (37.7 C) (02/07 2027) Temp Source: Oral (02/07 2027) BP: 122/69 mmHg (02/07 1930) Pulse Rate: 90 (02/07 1921) Intake/Output from previous day:   Intake/Output from this shift:    Labs:  Recent Labs  08/03/15 1952  WBC 3.0*  HGB 7.4*  PLT 13*  CREATININE 0.91   Estimated Creatinine Clearance: 53.2 mL/min (by C-G formula based on Cr of 0.91). No results for input(s): VANCOTROUGH, VANCOPEAK, VANCORANDOM, GENTTROUGH, GENTPEAK, GENTRANDOM, TOBRATROUGH, TOBRAPEAK, TOBRARND, AMIKACINPEAK, AMIKACINTROU, AMIKACIN in the last 72 hours.   Microbiology: Recent Results (from the past 720 hour(s))  Urine culture     Status: None   Collection Time: 07/27/15 11:58 AM  Result Value Ref Range Status   Urine Culture, Routine Final report  Final   Urine Culture result 1 Comment  Final    Comment: Mixed urogenital flora 10,000-25,000 colony forming units per mL   TECHNOLOGIST REVIEW     Status: None   Collection Time: 07/27/15 11:58 AM  Result Value Ref Range Status   Technologist Review Few Fragments  Final    Medical History: Past Medical History  Diagnosis Date  . Cervical disc disease     c7  . Spinal stenosis in cervical region   . Carpal tunnel syndrome   . Pinched nerve   . Varicose veins of lower extremities with inflammation   . Disc degeneration   . Hypertension     Borderline.  . Carotid stenosis     Mild bilateral  . Anemia   . Anxiety   . GERD (gastroesophageal reflux disease)   . Cancer ([place])   . Failure of stem cell transplant ([place])   . Fatigue 01/26/2015  . 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 Hypokalemia 07/16/2015  . Dysuria 07/26/2015  . Fever 07/27/2015  . Cough 07/27/2015  . Sinusitis, bacterial 07/27/2015     Medications:  Scheduled:   Infusions:  . aztreonam 2 g (08/03/15 2028)  . levofloxacin (LEVAQUIN) IV    . sodium chloride 1,000 mL (08/03/15 2009)  . vancomycin     Assessment:  70 yr female with multiple myeloma, undergoing chemo at [facility].  Reports fever, weakness and difficulty breathing.  Vancomycin 1gm IV, Levofloxacin 773m IV and Aztreonam 2gm IV x 1 dose each ordered in ED  Pharmacy consulted to continue dosing antibiotics for sepsis CrCl ~ 53 ml/min   2/7 >>Vanc >> 2/7 >>Aztreonam >>  2/7 >> Levofloxacin >>   2/7 blood: 2/7 urine:  Trough/Dose change info:   Goal of Therapy:  Vancomycin trough level 15-20 mcg/ml  Plan:  Measure antibiotic drug levels at steady state Follow up culture results Vancomycin 5033mIV q12h Aztreonam 2gm IV q8h Levofloxacin 75050mV q24h  Corliss Lamartina, LeaToribio HarbourharmD 08/03/2015,8:30 PM

## 2015-08-03 NOTE — ED Notes (Signed)
Patient transported to X-ray 

## 2015-08-03 NOTE — ED Notes (Signed)
Attempted report. RN unavailable.

## 2015-08-03 NOTE — ED Notes (Signed)
RN is accessing pt's port, will draw blood work

## 2015-08-03 NOTE — ED Notes (Signed)
Pt reports she has a fever (102.0 oral) since Saturday, difficulty breathing and generalized weakness. Pt is receiving chemo at [facility], last treatment was last Thursday. Pt was at [facility] yesterday for scheduled labs.

## 2015-08-03 NOTE — ED Notes (Signed)
MD at bedside. 

## 2015-08-03 NOTE — ED Provider Notes (Addendum)
CSN: [id number]     Arrival date & time 08/03/15  [id number] History   First MD Initiated Contact with Patient 08/03/15 1927     Chief Complaint  Patient presents with  . Fever  . Weakness  . Respiratory Distress     (Consider location/radiation/quality/duration/timing/severity/associated sxs/prior Treatment) HPI Comments: Patient here complaining of fever times several days with associated dyspnea as well as generalized weakness. Patient is currently receiving chemotherapy and her last dose was this week. Was seen at [facility] in the [place] yesterday and according to her head and negative flu test as well as a chest x-ray that was normal. Blood cultures were obtained yesterday as well. She has been treating her fever at home with Tylenol and called her oncologist at [facility] was told to come here. They noted that her blood cultures have been negative. She has had some diarrhea but denies any abdominal discomfort. No urinary symptoms.  Patient is a 70 y.o. female presenting with fever and weakness. The history is provided by the patient and a friend.  Fever Weakness    Past Medical History  Diagnosis Date  . Cervical disc disease     c7  . Spinal stenosis in cervical region   . Carpal tunnel syndrome   . Pinched nerve   . Varicose veins of lower extremities with inflammation   . Disc degeneration   . Hypertension     Borderline.  . Carotid stenosis     Mild bilateral  . Anemia   . Anxiety   . GERD (gastroesophageal reflux disease)   . Cancer ([place])   . Failure of stem cell transplant ([place])   . Fatigue 01/26/2015  . 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 Hypokalemia 07/16/2015  . Dysuria 07/26/2015  . Fever 07/27/2015  . Cough 07/27/2015  . Sinusitis, bacterial 07/27/2015   Past Surgical History  Procedure Laterality Date  . Colonoscopy    . Bladder tact  1988/1989    x2  . Shoulder surgery Right 04/06/13    right  . Port-a-cath removal     Family History  Problem  Relation Age of Onset  . Heart disease Mother   . Heart failure Father   . Heart disease Father   . Hypertension Brother   . Heart disease Brother     congenital shunt, ?   . Colon cancer Paternal Aunt 48  . Throat cancer Father   . Skin cancer Father   . Skin cancer Brother   . Diabetes Father   . Kidney disease Father    Social History  Substance Use Topics  . Smoking status: Former Smoker -- 0.50 packs/day for 4 years    Quit date: 06/26/1968  . Smokeless tobacco: Never Used  . Alcohol Use: 0.6 oz/week    1 Cans of beer per week     Comment: minimal   OB History    No data available     Review of Systems  Constitutional: Positive for fever.  Neurological: Positive for weakness.  All other systems reviewed and are negative.     Allergies  Penicillins and Sulfa antibiotics  Home Medications   Prior to Admission medications   Medication Sig Start Date End Date Taking? Authorizing Provider  acetaminophen (TYLENOL) 500 MG tablet Take 1,500 mg by mouth as needed for moderate pain, fever or headache.     Historical Provider, MD  acyclovir (ZOVIRAX) 400 MG tablet TAKE 1 TABLET (400 MG TOTAL) BY MOUTH 2 (TWO)  TIMES DAILY. 06/15/15   [name], MD  allopurinol (ZYLOPRIM) 300 MG tablet Take 300 mg by mouth daily. 07/22/15 07/21/16  Historical Provider, MD  ALPRAZolam Duanne Moron) 0.5 MG tablet Take 1 tablet (0.5 mg total) by mouth 2 (two) times daily as needed for anxiety. 05/31/15   [name], MD  Azithromycin (ZITHROMAX Z-PAK PO) Take by mouth.    Historical Provider, MD  calcium carbonate (TUMS EX) 750 MG chewable tablet Chew 1 tablet by mouth 3 (three) times daily as needed for heartburn.  08/04/14   Historical Provider, MD  carvedilol (COREG) 6.25 MG tablet Take 0.5 tablets (3.125 mg total) by mouth 2 (two) times daily with a meal. 01/21/15   Minna Merritts, MD  cholecalciferol (VITAMIN D) 1000 UNITS tablet Take 1,000 Units by mouth daily.    Historical Provider, MD  docusate  sodium (COLACE) 100 MG capsule Take 100 mg by mouth 2 (two) times daily as needed for mild constipation or moderate constipation.     Historical Provider, MD  guaiFENesin (MUCINEX) 600 MG 12 hr tablet Take by mouth 2 (two) times daily.    Historical Provider, MD  levofloxacin (LEVAQUIN) 500 MG tablet Take 1 tablet (500 mg total) by mouth daily. 07/27/15   [name], MD  loperamide (IMODIUM) 2 MG capsule Take by mouth as needed for diarrhea or loose stools.    Historical Provider, MD  mirtazapine (REMERON) 30 MG tablet Take 1 tablet (30 mg total) by mouth at bedtime. 04/30/15   [name], MD  Multiple Vitamin (MULTIVITAMIN WITH MINERALS) TABS tablet Take 1 tablet by mouth daily.    Historical Provider, MD  OXYQUINOLONE SULFATE VAGINAL (TRIMO-SAN) 0.025 % GEL Place 1 application vaginally 2 (two) times a week. 11/19/14   Historical Provider, MD  predniSONE (DELTASONE) 10 MG tablet Take 30 mg by mouth. For two days following dara 07/22/15   Historical Provider, MD   BP 120/72 mmHg  Pulse 90  Temp(Src) 98.2 F (36.8 C) (Oral)  Resp 21  Ht 5\' 3"  (1.6 m)  Wt 65.772 kg  BMI 25.69 kg/m2  SpO2 100% Physical Exam  Constitutional: She is oriented to person, place, and time. She appears well-developed and well-nourished.  Non-toxic appearance. No distress.  HENT:  Head: Normocephalic and atraumatic.  Eyes: Conjunctivae, EOM and lids are normal. Pupils are equal, round, and reactive to light.  Neck: Normal range of motion. Neck supple. No tracheal deviation present. No thyroid mass present.  Cardiovascular: Normal rate, regular rhythm and normal heart sounds.  Exam reveals no gallop.   No murmur heard. Pulmonary/Chest: Effort normal and breath sounds normal. No stridor. No respiratory distress. She has no decreased breath sounds. She has no wheezes. She has no rhonchi. She has no rales.  Abdominal: Soft. Normal appearance and bowel sounds are normal. She exhibits no distension. There is no tenderness.  There is no rigidity, no rebound, no guarding and no CVA tenderness.  Musculoskeletal: Normal range of motion. She exhibits no edema or tenderness.  Neurological: She is alert and oriented to person, place, and time. She has normal strength. No cranial nerve deficit or sensory deficit. GCS eye subscore is 4. GCS verbal subscore is 5. GCS motor subscore is 6.  Skin: Skin is warm and dry. No abrasion and no rash noted.  Psychiatric: She has a normal mood and affect. Her speech is normal and behavior is normal.  Nursing note and vitals reviewed.   ED Course  Procedures (including critical care time)  Labs Review Labs Reviewed  CULTURE, BLOOD (ROUTINE X 2)  CULTURE, BLOOD (ROUTINE X 2)  URINE CULTURE  COMPREHENSIVE METABOLIC PANEL  URINALYSIS, ROUTINE W REFLEX MICROSCOPIC (NOT AT [facility])  CBC WITH DIFFERENTIAL/PLATELET  I-STAT CG4 LACTIC ACID, ED    Imaging Review No results found. I have personally reviewed and evaluated these images and lab results as part of my medical decision-making.   EKG Interpretation None      MDM   Final diagnoses:  None    Patient's initial lactic acid within normal limits but the repeat was elevated. IV fluid bolus ordered and patient had these perceived IV antibiotics after she had her blood cultures performed. She is maintaining appropriate this time. Will be admitted to the hospitalist service  CRITICAL CARE Performed by: Leota Jacobsen Total critical care time: 45 minutes Critical care time was exclusive of separately billable procedures and treating other patients. Critical care was necessary to treat or prevent imminent or life-threatening deterioration. Critical care was time spent personally by me on the following activities: development of treatment plan with patient and/or surrogate as well as nursing, discussions with consultants, evaluation of patient's response to treatment, examination of patient, obtaining history from patient or  surrogate, ordering and performing treatments and interventions, ordering and review of laboratory studies, ordering and review of radiographic studies, pulse oximetry and re-evaluation of patient's condition.   Lacretia Leigh, MD 08/03/15 2158  Lacretia Leigh, MD 08/03/15 2158

## 2015-08-03 NOTE — ED Notes (Signed)
Report called to 5E. Waiting for room to be cleaned. RN states floor will call when room is ready\

## 2015-08-03 NOTE — ED Notes (Signed)
Spoke with phlebotomy concerning lactic acid blood draw. States they will collect blood shortly.

## 2015-08-04 ENCOUNTER — Encounter (HOSPITAL_COMMUNITY): Payer: Self-pay | Admitting: Internal Medicine

## 2015-08-04 DIAGNOSIS — R509 Fever, unspecified: Secondary | ICD-10-CM

## 2015-08-04 DIAGNOSIS — A419 Sepsis, unspecified organism: Secondary | ICD-10-CM | POA: Diagnosis present

## 2015-08-04 LAB — COMPREHENSIVE METABOLIC PANEL
ALT: 8 U/L — ABNORMAL LOW (ref 14–54)
AST: 11 U/L — ABNORMAL LOW (ref 15–41)
Albumin: 3.2 g/dL — ABNORMAL LOW (ref 3.5–5.0)
Alkaline Phosphatase: 24 U/L — ABNORMAL LOW (ref 38–126)
Anion gap: 9 (ref 5–15)
BUN: 12 mg/dL (ref 6–20)
CO2: 22 mmol/L (ref 22–32)
Calcium: 7.2 mg/dL — ABNORMAL LOW (ref 8.9–10.3)
Chloride: 105 mmol/L (ref 101–111)
Creatinine, Ser: 0.8 mg/dL (ref 0.44–1.00)
GFR calc Af Amer: >60 mL/min (ref >60)
GFR calc non Af Amer: >60 mL/min (ref >60)
Glucose, Bld: 124 mg/dL — ABNORMAL HIGH (ref 65–99)
Potassium: 3.1 mmol/L — ABNORMAL LOW (ref 3.5–5.1)
Sodium: 136 mmol/L (ref 135–145)
Total Bilirubin: 0.6 mg/dL (ref 0.3–1.2)
Total Protein: 5 g/dL — ABNORMAL LOW (ref 6.5–8.1)

## 2015-08-04 LAB — CBC WITH DIFFERENTIAL/PLATELET
Basophils Absolute: 0 10*3/uL (ref 0.0–0.1)
Basophils Absolute: 0 10*3/uL (ref 0.0–0.1)
Basophils Relative: 0 %
Basophils Relative: 0 %
Eosinophils Absolute: 0 10*3/uL (ref 0.0–0.7)
Eosinophils Absolute: 0 10*3/uL (ref 0.0–0.7)
Eosinophils Relative: 0 %
Eosinophils Relative: 0 %
HCT: 21.9 % — ABNORMAL LOW (ref 36.0–46.0)
HCT: 18.3 % — ABNORMAL LOW (ref 36.0–46.0)
Hemoglobin: 7.4 g/dL — ABNORMAL LOW (ref 12.0–15.0)
Hemoglobin: 6 g/dL — CL (ref 12.0–15.0)
Lymphocytes Relative: 44 %
Lymphocytes Relative: 38 %
Lymphs Abs: 1.3 10*3/uL (ref 0.7–4.0)
Lymphs Abs: 0.6 10*3/uL — ABNORMAL LOW (ref 0.7–4.0)
MCH: 27.2 pg (ref 26.0–34.0)
MCH: 27.1 pg (ref 26.0–34.0)
MCHC: 33.8 g/dL (ref 30.0–36.0)
MCHC: 32.8 g/dL (ref 30.0–36.0)
MCV: 80.5 fL (ref 78.0–100.0)
MCV: 82.8 fL (ref 78.0–100.0)
Monocytes Absolute: 0.4 10*3/uL (ref 0.1–1.0)
Monocytes Absolute: 0.3 10*3/uL (ref 0.1–1.0)
Monocytes Relative: 13 %
Monocytes Relative: 18 %
Neutro Abs: 1.3 10*3/uL — ABNORMAL LOW (ref 1.7–7.7)
Neutro Abs: 0.6 10*3/uL — ABNORMAL LOW (ref 1.7–7.7)
Neutrophils Relative %: 43 %
Neutrophils Relative %: 44 %
Platelets: 13 10*3/uL — CL (ref 150–400)
Platelets: 11 10*3/uL — CL (ref 150–400)
RBC: 2.72 MIL/uL — ABNORMAL LOW (ref 3.87–5.11)
RBC: 2.21 MIL/uL — ABNORMAL LOW (ref 3.87–5.11)
RDW: 16 % — ABNORMAL HIGH (ref 11.5–15.5)
RDW: 16.3 % — ABNORMAL HIGH (ref 11.5–15.5)
WBC: 3 10*3/uL — ABNORMAL LOW (ref 4.0–10.5)
WBC: 1.5 10*3/uL — ABNORMAL LOW (ref 4.0–10.5)

## 2015-08-04 LAB — URINE CULTURE: Culture: 3000

## 2015-08-04 LAB — INFLUENZA PANEL BY PCR (TYPE A & B)
H1N1 flu by pcr: NOT DETECTED
Influenza A By PCR: NEGATIVE
Influenza B By PCR: NEGATIVE

## 2015-08-04 LAB — PREPARE RBC (CROSSMATCH)

## 2015-08-04 LAB — LACTIC ACID, PLASMA
Lactic Acid, Venous: 0.8 mmol/L (ref 0.5–2.0)
Lactic Acid, Venous: 1.7 mmol/L (ref 0.5–2.0)

## 2015-08-04 LAB — PROTIME-INR
INR: 1.44 (ref 0.00–1.49)
Prothrombin Time: 17.1 seconds — ABNORMAL HIGH (ref 11.6–15.2)

## 2015-08-04 LAB — APTT: aPTT: 51 seconds — ABNORMAL HIGH (ref 24–37)

## 2015-08-04 LAB — PROCALCITONIN: Procalcitonin: <0.1 ng/mL

## 2015-08-04 MED ORDER — SODIUM CHLORIDE 0.9 % IV SOLN
INTRAVENOUS | Status: DC
Start: 2015-08-04 — End: 2015-08-05
  Administered 2015-08-04 (×2): via INTRAVENOUS

## 2015-08-04 MED ORDER — LEVOFLOXACIN IN D5W 750 MG/150ML IV SOLN: 750.0000 mg | Freq: Once | INTRAVENOUS | Status: DC

## 2015-08-04 MED ORDER — ALPRAZOLAM 0.5 MG PO TABS
0.5000 mg | ORAL_TABLET | Freq: Two times a day (BID) | ORAL | Status: DC | PRN
  Administered 2015-08-04: 0.5 mg via ORAL
  Filled 2015-08-04: qty 1

## 2015-08-04 MED ORDER — SALINE SPRAY 0.65 % NA SOLN
1.0000 | NASAL | Status: DC | PRN
  Filled 2015-08-04: qty 44

## 2015-08-04 MED ORDER — ONDANSETRON HCL 4 MG PO TABS: 4.0000 mg | ORAL_TABLET | Freq: Four times a day (QID) | ORAL | Status: DC | PRN

## 2015-08-04 MED ORDER — SODIUM CHLORIDE 0.9 % IV SOLN
Freq: Once | INTRAVENOUS | Status: DC
Start: 2015-08-04 — End: 2015-08-06

## 2015-08-04 MED ORDER — VITAMIN D3 25 MCG (1000 UNIT) PO TABS
1000.0000 [IU] | ORAL_TABLET | Freq: Every day | ORAL | Status: DC
  Administered 2015-08-04 – 2015-08-06 (×3): 1000 [IU] via ORAL
  Filled 2015-08-04 (×3): qty 1

## 2015-08-04 MED ORDER — CALCIUM CARBONATE ANTACID 500 MG PO CHEW
1.0000 | CHEWABLE_TABLET | Freq: Three times a day (TID) | ORAL | Status: DC | PRN
  Administered 2015-08-04: 200 mg via ORAL
  Filled 2015-08-04 (×2): qty 1

## 2015-08-04 MED ORDER — ACETAMINOPHEN 650 MG RE SUPP: 650.0000 mg | Freq: Four times a day (QID) | RECTAL | Status: DC | PRN

## 2015-08-04 MED ORDER — LOPERAMIDE HCL 2 MG PO CAPS
2.0000 mg | ORAL_CAPSULE | ORAL | Status: DC | PRN
  Administered 2015-08-04: 2 mg via ORAL
  Filled 2015-08-04: qty 1

## 2015-08-04 MED ORDER — SODIUM CHLORIDE 0.9 % IV SOLN: Freq: Once | INTRAVENOUS | Status: DC

## 2015-08-04 MED ORDER — MIRTAZAPINE 30 MG PO TABS
30.0000 mg | ORAL_TABLET | Freq: Every day | ORAL | Status: DC
  Administered 2015-08-04 – 2015-08-05 (×3): 30 mg via ORAL
  Filled 2015-08-04 (×4): qty 1

## 2015-08-04 MED ORDER — DEXTROSE 5 % IV SOLN: 2.0000 g | Freq: Once | INTRAVENOUS | Status: DC

## 2015-08-04 MED ORDER — SODIUM CHLORIDE 0.9% FLUSH
10.0000 mL | INTRAVENOUS | Status: DC | PRN
  Administered 2015-08-04 – 2015-08-06 (×3): 10 mL
  Filled 2015-08-04 (×2): qty 40

## 2015-08-04 MED ORDER — VANCOMYCIN HCL IN DEXTROSE 1-5 GM/200ML-% IV SOLN
1000.0000 mg | Freq: Once | INTRAVENOUS | Status: DC
Start: 2015-08-04 — End: 2015-08-04

## 2015-08-04 MED ORDER — ACYCLOVIR 400 MG PO TABS
400.0000 mg | ORAL_TABLET | Freq: Two times a day (BID) | ORAL | Status: DC
  Administered 2015-08-04 – 2015-08-06 (×5): 400 mg via ORAL
  Filled 2015-08-04 (×7): qty 1

## 2015-08-04 MED ORDER — BOOST PLUS PO LIQD
237.0000 mL | Freq: Three times a day (TID) | ORAL | Status: DC
  Administered 2015-08-04: 237 mL via ORAL
  Filled 2015-08-04 (×6): qty 237

## 2015-08-04 MED ORDER — SODIUM CHLORIDE 0.9% FLUSH
10.0000 mL | Freq: Two times a day (BID) | INTRAVENOUS | Status: DC
  Administered 2015-08-04 – 2015-08-05 (×2): 10 mL

## 2015-08-04 MED ORDER — ACETAMINOPHEN 325 MG PO TABS
650.0000 mg | ORAL_TABLET | Freq: Four times a day (QID) | ORAL | Status: DC | PRN
  Administered 2015-08-04 – 2015-08-05 (×3): 650 mg via ORAL
  Filled 2015-08-04 (×3): qty 2

## 2015-08-04 MED ORDER — CARVEDILOL 3.125 MG PO TABS
3.1250 mg | ORAL_TABLET | Freq: Two times a day (BID) | ORAL | Status: DC
  Administered 2015-08-04 – 2015-08-06 (×4): 3.125 mg via ORAL
  Filled 2015-08-04 (×7): qty 1

## 2015-08-04 MED ORDER — SODIUM CHLORIDE 0.9 % IV SOLN
Freq: Once | INTRAVENOUS | Status: AC
  Administered 2015-08-04: 13:00:00 via INTRAVENOUS

## 2015-08-04 MED ORDER — ONDANSETRON HCL 4 MG/2ML IJ SOLN: 4.0000 mg | Freq: Four times a day (QID) | INTRAMUSCULAR | Status: DC | PRN

## 2015-08-04 MED ORDER — ADULT MULTIVITAMIN W/MINERALS CH
1.0000 | ORAL_TABLET | Freq: Every day | ORAL | Status: DC
  Administered 2015-08-04 – 2015-08-06 (×3): 1 via ORAL
  Filled 2015-08-04 (×3): qty 1

## 2015-08-04 MED ORDER — SODIUM CHLORIDE 0.9 % IV SOLN
1.0000 g | Freq: Three times a day (TID) | INTRAVENOUS | Status: DC
  Administered 2015-08-04 – 2015-08-06 (×6): 1 g via INTRAVENOUS
  Filled 2015-08-04 (×7): qty 1

## 2015-08-04 MED ORDER — ALLOPURINOL 300 MG PO TABS
300.0000 mg | ORAL_TABLET | Freq: Every day | ORAL | Status: DC
Start: 2015-08-04 — End: 2015-08-06
  Administered 2015-08-04 – 2015-08-06 (×3): 300 mg via ORAL
  Filled 2015-08-04 (×3): qty 1

## 2015-08-04 NOTE — Progress Notes (Addendum)
Pt wants to discuss with MD at [facility] before agreeing to receive blood transfusion. Pt states she was told that the current chemotherapy drug she was receiving could give false reading regarding Hgb. Pt left a message with [facility] answering service and states that she should receive a call back at [phone]. K. Schorr,NP notified.

## 2015-08-04 NOTE — Progress Notes (Signed)
Initial Nutrition Assessment  DOCUMENTATION CODES:   Not applicable  INTERVENTION:  BOOST PLUS TID, each supplement provides 360 calories and 14 grams of protein   NUTRITION DIAGNOSIS:   Increased nutrient needs related to chronic illness, catabolic illness, cancer and cancer related treatments as evidenced by estimated needs. GOAL:   Patient will meet greater than or equal to 90% of their needs  MONITOR:   PO intake, Supplement acceptance, Labs, I & O's, Skin  REASON FOR ASSESSMENT:   Malnutrition Screening Tool    ASSESSMENT:   CEIRRA BELLI is a 70 y.o. female with history of relapsing multiple myeloma, pancytopenia, myelodysplastic syndrome presents to the ER because of weakness and fever and chills. Patient has been having these symptoms for almost 2 weeks now.  Spoke with pt briefly at bedside. States that after relapse diagnosis in December she lost about 12-15# up to this point. States she was eating well, but 2 days prior to admission she had no taste, and hadn't eaten much. She has been fortunate to maintain her taste buds throughout all of her treatment.  Nutrition-Focused physical exam completed. Findings are mild fat depletion, mild muscle depletion, and no edema.   Per chart, she exhibits a 9#/5.7% insignificant wt loss in 3 months. Labs: K 3.1, Mg 2.8, Alp Phos 24, Vit D 26 Medications reviewed.    Diet Order:  Diet regular Room service appropriate?: Yes; Fluid consistency:: Thin  Skin:  Reviewed, no issues  Last BM:  08/01/2015  Height:   Ht Readings from Last 1 Encounters:  08/03/15 5' 3" (1.6 m)    Weight:   Wt Readings from Last 1 Encounters:  08/03/15 147 lb 14.9 oz (67.1 kg)    Ideal Body Weight:  52.27 kg  BMI:  Body mass index is 26.21 kg/(m^2).  Estimated Nutritional Needs:   Kcal:  1650-2000  Protein:  65-80 grams  Fluid:  >/= 1.7L  EDUCATION NEEDS:   No education needs identified at this time  Satira Anis. Whittany Parish, MS, RD  LDN After Hours/Weekend Pager [phone]

## 2015-08-04 NOTE — Care Management Note (Signed)
Case Management Note  Patient Details  Name: Jean Davidson MRN: [id number] Date of Birth: 05/31/1946  Subjective/Objective:               anemia     Action/Plan:Date: August 04, 2015 Chart reviewed for concurrent status and case management needs. Will continue to follow patient for changes and needs: Velva Harman, BSN, RN, [place]   [phone]   Expected Discharge Date:                  Expected Discharge Plan:  Home/Self Care  In-House Referral:  NA  Discharge planning Services  CM Consult  Post Acute Care Choice:  NA Choice offered to:  NA  DME Arranged:    DME Agency:     HH Arranged:    [place] Agency:     Status of Service:  Completed, signed off  Medicare Important Message Given:    Date Medicare IM Given:    Medicare IM give by:    Date Additional Medicare IM Given:    Additional Medicare Important Message give by:     If discussed at [place] of Stay Meetings, dates discussed:    Additional Comments:  Leeroy Cha, RN 08/04/2015, 10:33 AM

## 2015-08-04 NOTE — Progress Notes (Addendum)
Patient admitted after midnight.  Spoke with [facility] NP and patient-- would transfuse both PRBC and plts.  Type and and cross may be combs + falsely due to chemo Suspect sinusitis- spoke with ENT and discussed CT scan from [place]-- rec to give IV abx -blood cultures pending -x ray of chest no PNA -Urine clean -will treat with IV vanc and merrem for now. Monitor labs  Jean Bear DO

## 2015-08-04 NOTE — Progress Notes (Signed)
CRITICAL VALUE ALERT  Critical value received: Hgb-6.0, Plts-11  Date of notification:  08/04/15  Time of notification:  [id number]  Critical value read back:Yes.    Nurse who received alert:  S.Akira Perusse,RN  MD notified (1st page):  Raliegh Ip. Schorr,NP  Time of first page:  0430  MD notified (2nd page):  Time of second page:  Responding MD:  K.Schorr,NP  Time MD responded:  [phone]

## 2015-08-04 NOTE — H&P (Signed)
[facility] History and Physical  Jean Davidson NTZ:[id number] DOB: [birth year] DOA: 08/03/2015  Referring physician: Dr. Zenia Resides. PCP: Eliezer Lofts, MD  Specialists: Dr. Alvy Bimler.  Chief Complaint: Weakness fever chills.  HPI: Jean Davidson is a 70 y.o. female with history of relapsing multiple myeloma, pancytopenia, myelodysplastic syndrome presents to the ER because of weakness and fever and chills. Patient has been having these symptoms for almost 2 weeks now. Patient was recently placed on antibiotics for sinusitis by patient's oncologist. Patient is following up at [facility] for planned bone marrow transplant. Patient otherwise denies any chest pain shortness of breath nausea vomiting diarrhea. Patient states she has severe weakness and had her take rest while she was driving. In the ER patient was found to be febrile and lactic acid was elevated. Patient was also tachycardic. Patient has been admitted for possible sepsis unknown source.   Review of Systems: As presented in the history of presenting illness, rest negative.  Past Medical History  Diagnosis Date  . Cervical disc disease     c7  . Spinal stenosis in cervical region   . Carpal tunnel syndrome   . Pinched nerve   . Varicose veins of lower extremities with inflammation   . Disc degeneration   . Hypertension     Borderline.  . Carotid stenosis     Mild bilateral  . Anemia   . Anxiety   . GERD (gastroesophageal reflux disease)   . Cancer ([place])   . Failure of stem cell transplant ([place])   . Fatigue 01/26/2015  . 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 Hypokalemia 07/16/2015  . Dysuria 07/26/2015  . Fever 07/27/2015  . Cough 07/27/2015  . Sinusitis, bacterial 07/27/2015   Past Surgical History  Procedure Laterality Date  . Colonoscopy    . Bladder tact  1988/1989    x2  . Shoulder surgery Right 04/06/13    right  . Port-a-cath removal     Social History:  reports that she quit smoking about 47 years ago. She has never  used smokeless tobacco. She reports that she drinks about 0.6 oz of alcohol per week. She reports that she does not use illicit drugs. Where does patient live at home. Can patient participate in ADLs? Yes.  Allergies  Allergen Reactions  . Penicillins       . Sulfa Antibiotics     UNKNOWN-CHILDHOOD ALLERGY    Family History:  Family History  Problem Relation Age of Onset  . Heart disease Mother   . Heart failure Father   . Heart disease Father   . Hypertension Brother   . Heart disease Brother     congenital shunt, ?   . Colon cancer Paternal Aunt 56  . Throat cancer Father   . Skin cancer Father   . Skin cancer Brother   . Diabetes Father   . Kidney disease Father      Prior to Admission medications   Medication Sig Start Date End Date Taking? Authorizing Provider  acetaminophen (TYLENOL) 500 MG tablet Take 1,500 mg by mouth as needed for moderate pain, fever or headache.    Yes Historical Provider, MD  acyclovir (ZOVIRAX) 400 MG tablet TAKE 1 TABLET (400 MG TOTAL) BY MOUTH 2 (TWO) TIMES DAILY. 06/15/15  Yes [name], MD  allopurinol (ZYLOPRIM) 300 MG tablet Take 300 mg by mouth daily. 07/22/15 07/21/16 Yes Historical Provider, MD  ALPRAZolam Duanne Moron) 0.5 MG tablet Take 1 tablet (0.5 mg total) by mouth 2 (  two) times daily as needed for anxiety. 05/31/15  Yes [name], MD  calcium carbonate (TUMS EX) 750 MG chewable tablet Chew 1 tablet by mouth 3 (three) times daily as needed for heartburn.  08/04/14  Yes Historical Provider, MD  carvedilol (COREG) 6.25 MG tablet Take 0.5 tablets (3.125 mg total) by mouth 2 (two) times daily with a meal. 01/21/15  Yes Minna Merritts, MD  cholecalciferol (VITAMIN D) 1000 UNITS tablet Take 1,000 Units by mouth daily.   Yes Historical Provider, MD  daratumumab 16 mg/kg in sodium chloride 0.9 % Inject 1,100 mg into the vein once.   Yes Historical Provider, MD  docusate sodium (COLACE) 100 MG capsule Take 100 mg by mouth 2 (two) times daily as  needed for mild constipation or moderate constipation.    Yes Historical Provider, MD  hydroxyurea (HYDREA) 500 MG capsule Take 500 mg by mouth 2 (two) times daily. When white count elevated. 06/18/15  Yes Historical Provider, MD  levofloxacin (LEVAQUIN) 500 MG tablet Take 1 tablet (500 mg total) by mouth daily. 07/27/15  Yes [name], MD  lidocaine-prilocaine (EMLA) cream Apply 1 application topically as needed.   Yes Historical Provider, MD  loperamide (IMODIUM) 2 MG capsule Take by mouth as needed for diarrhea or loose stools.   Yes Historical Provider, MD  mirtazapine (REMERON) 30 MG tablet Take 1 tablet (30 mg total) by mouth at bedtime. 04/30/15  Yes [name], MD  Multiple Vitamin (MULTIVITAMIN WITH MINERALS) TABS tablet Take 1 tablet by mouth daily.   Yes Historical Provider, MD  predniSONE (DELTASONE) 10 MG tablet Take 30 mg by mouth. For two days following daratumumab. 07/22/15  Yes Historical Provider, MD    Physical Exam: Filed Vitals:   08/03/15 2152 08/03/15 2229 08/03/15 2300 08/03/15 2348  BP: 133/61 142/54 140/54 126/66  Pulse: 99 94 91 87  Temp: 100 F (37.8 C) 99.2 F (37.3 C)  98.5 F (36.9 C)  TempSrc: Oral Oral  Oral  Resp: '20 16 17 16  ' Height:    '5\' 3"'  (1.6 m)  Weight:    67.1 kg (147 lb 14.9 oz)  SpO2: 98% 97% 99% 100%     General:  Moderately built and nourished.  Eyes: Anicteric no pallor.  ENT: No discharge from the ears eyes nose or mouth.  Neck: No mass felt.  Cardiovascular: S1-S2 heard.  Respiratory: No rhonchi or crepitations.  Abdomen: Soft nontender bowel sounds present.  Skin: No rash.  Musculoskeletal: No edema.  Psychiatric: Appears normal.  Neurologic: Alert awake oriented to time place and person. Moves all extremities.  Labs on Admission:  Basic Metabolic Panel:  Recent Labs Lab 08/03/15 1952  NA 129*  K 3.1*  CL 97*  CO2 23  GLUCOSE 130*  BUN 16  CREATININE 0.91  CALCIUM 7.9*   Liver Function Tests:  Recent  Labs Lab 08/03/15 1952  AST 15  ALT 9*  ALKPHOS 29*  BILITOT 0.9  PROT 6.3*  ALBUMIN 3.8   No results for input(s): LIPASE, AMYLASE in the last 168 hours. No results for input(s): AMMONIA in the last 168 hours. CBC:  Recent Labs Lab 08/03/15 1952  WBC 3.0*  NEUTROABS 1.3*  HGB 7.4*  HCT 21.9*  MCV 80.5  PLT 13*   Cardiac Enzymes: No results for input(s): CKTOTAL, CKMB, CKMBINDEX, TROPONINI in the last 168 hours.  BNP (last 3 results) No results for input(s): BNP in the last 8760 hours.  ProBNP (last 3 results) No  results for input(s): PROBNP in the last 8760 hours.  CBG: No results for input(s): GLUCAP in the last 168 hours.  Radiological Exams on Admission: Dg Chest 2 View  08/03/2015  CLINICAL DATA:  Fever, shortness of breath and weakness. Myeloma. Recent chemotherapy. EXAM: CHEST  2 VIEW COMPARISON:  07/27/2015. FINDINGS: The heart size and mediastinal contours are within normal limits. Both lungs are clear. Port-A-Cath good position, with tip in the proximal RIGHT atrium from a RIGHT subclavian approach. RIGHT shoulder arthroplasty. Degenerative change LEFT shoulder. Osteopenia but no definite thoracic compression deformity. RIGHT midlung zone granuloma. IMPRESSION: No active cardiopulmonary disease. Electronically Signed   By: Staci Righter M.D.   On: 08/03/2015 19:38    Assessment/Plan Principal Problem:   Sepsis ([place]) Active Problems:   Hypertension   Multiple myeloma in relapse (H[facility])   MDS (myelodysplastic syndrome) (HCC)   Fever   1. Sepsis - source not clear. Patient was recently treated for sinusitis. At this time patient has been placed on empiric antibiotics for sepsis and we will follow blood cultures urine cultures check influenza PCR and continue with hydration. Check lactic acid levels and for calcitonin levels. 2. Severe pancytopenia - exam is further drop in hemoglobin we will transfuse. Patient may also need transfusion of  platelets. 3. Multiple myeloma in relapse - per oncologist. 4. Myelodysplasia. 5. Hypertension on Coreg.  Due to severe pancytopenia M holding off patient's hydroxyurea for now but may discuss with patient's oncologist in a.m.   DVT Prophylaxis SCDs due to thrombocytopenia.  Code Status: DO NOT RESUSCITATE.  Family Communication: Discussed with patient.  Disposition Plan: Admit to inpatient.    Lovelace Cerveny N. [facility] Pager [phone].  If 7PM-7AM, please contact night-coverage [email or web address] Password [email or web address] 08/04/2015, 12:14 AM

## 2015-08-04 NOTE — Progress Notes (Signed)
Pharmacy Antibiotic Note  Jean Davidson is a 70 y.o. female admitted on 08/03/2015 with febrile neutropenia.  Pharmacy has been consulted for merrem dosing.  Plan: merrem 1gm IV q8h for CrCl > 53ml/min Continue vancomycin 500mg  IV q12h  Height: 5\' 3"  (160 cm) Weight: 147 lb 14.9 oz (67.1 kg) IBW/kg (Calculated) : 52.4  Temp (24hrs), Avg:99 F (37.2 C), Min:98.2 F (36.8 C), Max:100 F (37.8 C)   Recent Labs Lab 08/03/15 1952 08/03/15 2001 08/03/15 2137 08/03/15 2205 08/04/15 0321 08/04/15 0630  WBC 3.0*  --   --   --  1.5*  --   CREATININE 0.91  --   --   --  0.80  --   LATICACIDVEN  --  1.51 3.34* 3.54* 0.8 1.7    Estimated Creatinine Clearance: 61.1 mL/min (by C-G formula based on Cr of 0.8).     Antimicrobials this admission: 2/7 >>Vanc >> 2/7 >>Aztreonam >> 2/8 2/7 >> Levofloxacin >> 2/8 2/8 >>merrem >> Dose adjustments this admission: n/a  Microbiology results: 2/7  BCx:  2/7 UCx: NGTD  Thank you for allowing pharmacy to be a part of this patient's care.  Dolly Rias RPh 08/04/2015, 12:32 PM Pager [phone]

## 2015-08-05 DIAGNOSIS — D469 Myelodysplastic syndrome, unspecified: Secondary | ICD-10-CM

## 2015-08-05 DIAGNOSIS — A419 Sepsis, unspecified organism: Principal | ICD-10-CM

## 2015-08-05 DIAGNOSIS — I10 Essential (primary) hypertension: Secondary | ICD-10-CM

## 2015-08-05 LAB — PREPARE PLATELET PHERESIS: Unit division: 0

## 2015-08-05 LAB — BASIC METABOLIC PANEL
Anion gap: 8 (ref 5–15)
BUN: 5 mg/dL — ABNORMAL LOW (ref 6–20)
CO2: 23 mmol/L (ref 22–32)
Calcium: 7.8 mg/dL — ABNORMAL LOW (ref 8.9–10.3)
Chloride: 106 mmol/L (ref 101–111)
Creatinine, Ser: 0.7 mg/dL (ref 0.44–1.00)
GFR calc Af Amer: >60 mL/min (ref >60)
GFR calc non Af Amer: >60 mL/min (ref >60)
Glucose, Bld: 164 mg/dL — ABNORMAL HIGH (ref 65–99)
Potassium: 3 mmol/L — ABNORMAL LOW (ref 3.5–5.1)
Sodium: 137 mmol/L (ref 135–145)

## 2015-08-05 LAB — CBC
HCT: 28.5 % — ABNORMAL LOW (ref 36.0–46.0)
Hemoglobin: 9.3 g/dL — ABNORMAL LOW (ref 12.0–15.0)
MCH: 27.6 pg (ref 26.0–34.0)
MCHC: 32.6 g/dL (ref 30.0–36.0)
MCV: 84.6 fL (ref 78.0–100.0)
Platelets: 25 10*3/uL — CL (ref 150–400)
RBC: 3.37 MIL/uL — ABNORMAL LOW (ref 3.87–5.11)
RDW: 15.4 % (ref 11.5–15.5)
WBC: 1.7 10*3/uL — ABNORMAL LOW (ref 4.0–10.5)

## 2015-08-05 MED ORDER — POTASSIUM CHLORIDE CRYS ER 20 MEQ PO TBCR
40.0000 meq | EXTENDED_RELEASE_TABLET | ORAL | Status: AC
  Administered 2015-08-05 (×2): 40 meq via ORAL
  Filled 2015-08-05 (×2): qty 2

## 2015-08-05 MED ORDER — LIP MEDEX EX OINT
TOPICAL_OINTMENT | CUTANEOUS | Status: AC
  Administered 2015-08-05: 1
  Filled 2015-08-05: qty 7

## 2015-08-05 MED ORDER — BIOTENE DRY MOUTH MT LIQD: 15.0000 mL | OROMUCOSAL | Status: DC | PRN

## 2015-08-05 MED ORDER — DIPHENHYDRAMINE HCL 50 MG/ML IJ SOLN
25.0000 mg | Freq: Once | INTRAMUSCULAR | Status: DC
Start: 2015-08-05 — End: 2015-08-06
  Filled 2015-08-05: qty 1

## 2015-08-05 NOTE — Progress Notes (Signed)
PROGRESS NOTE  Jean Davidson YIR:[id number] DOB: 1946/04/10 DOA: 08/03/2015 PCP: Eliezer Lofts, MD  Assessment/Plan: Sepsis - source not clear. ?  Sinusitis- spoke with ENT here-- IV abx-- can follow up at [facility] IV abx -blood cultures pending  Severe pancytopenia -  -transfused PRBC and plts  Multiple myeloma in relapse - per oncologist.  Myelodysplasia.  Hypertension  - Coreg.  Hypokalemia -replete   Code Status: DNR Family Communication:  Disposition Plan:    Consultants:    Procedures:      HPI/Subjective: Feeling much better, asking about going home  Objective: Filed Vitals:   08/05/15 0744 08/05/15 1400  BP: 134/60 132/56  Pulse: 80 90  Temp: 98.5 F (36.9 C) 97.9 F (36.6 C)  Resp: 18     Intake/Output Summary (Last 24 hours) at 08/05/15 1419 Last data filed at 08/05/15 1300  Gross per 24 hour  Intake 4801.25 ml  Output      0 ml  Net 4801.25 ml   Filed Weights   08/03/15 1855 08/03/15 2348  Weight: 65.772 kg (145 lb) 67.1 kg (147 lb 14.9 oz)    Exam:   General:  Awake, sitting in chair  Cardiovascular: rrr  Respiratory: clear  Abdomen: +BS, soft  Musculoskeletal: no edema   Data Reviewed: Basic Metabolic Panel:  Recent Labs Lab 08/03/15 1952 08/04/15 0321 08/05/15 0930  NA 129* 136 137  K 3.1* 3.1* 3.0*  CL 97* 105 106  CO2 '23 22 23  ' GLUCOSE 130* 124* 164*  BUN 16 12 5*  CREATININE 0.91 0.80 0.70  CALCIUM 7.9* 7.2* 7.8*   Liver Function Tests:  Recent Labs Lab 08/03/15 1952 08/04/15 0321  AST 15 11*  ALT 9* 8*  ALKPHOS 29* 24*  BILITOT 0.9 0.6  PROT 6.3* 5.0*  ALBUMIN 3.8 3.2*   No results for input(s): LIPASE, AMYLASE in the last 168 hours. No results for input(s): AMMONIA in the last 168 hours. CBC:  Recent Labs Lab 08/03/15 1952 08/04/15 0321 08/05/15 0930  WBC 3.0* 1.5* 1.7*  NEUTROABS 1.3* 0.6*  --   HGB 7.4* 6.0* 9.3*  HCT 21.9* 18.3* 28.5*  MCV 80.5 82.8 84.6  PLT 13* 11* 25*    Cardiac Enzymes: No results for input(s): CKTOTAL, CKMB, CKMBINDEX, TROPONINI in the last 168 hours. BNP (last 3 results) No results for input(s): BNP in the last 8760 hours.  ProBNP (last 3 results) No results for input(s): PROBNP in the last 8760 hours.  CBG: No results for input(s): GLUCAP in the last 168 hours.  Recent Results (from the past 240 hour(s))  Urine culture     Status: None   Collection Time: 07/27/15 11:58 AM  Result Value Ref Range Status   Urine Culture, Routine Final report  Final   Urine Culture result 1 Comment  Final    Comment: Mixed urogenital flora 10,000-25,000 colony forming units per mL   TECHNOLOGIST REVIEW     Status: None   Collection Time: 07/27/15 11:58 AM  Result Value Ref Range Status   Technologist Review Few Fragments  Final  Culture, blood (routine x 2)     Status: None (Preliminary result)   Collection Time: 08/03/15  7:50 PM  Result Value Ref Range Status   Specimen Description BLOOD PORTA CATH  Final   Special Requests BOTTLES DRAWN AEROBIC AND ANAEROBIC 5ML  Final   Culture   Final    NO GROWTH < 24 HOURS Performed at [facility]  Report Status PENDING  Incomplete  Culture, blood (routine x 2)     Status: None (Preliminary result)   Collection Time: 08/03/15  8:07 PM  Result Value Ref Range Status   Specimen Description BLOOD LEFT HAND  Final   Special Requests BOTTLES DRAWN AEROBIC AND ANAEROBIC 5CC EACH  Final   Culture   Final    NO GROWTH < 24 HOURS Performed at [facility]    Report Status PENDING  Incomplete  Urine culture     Status: None   Collection Time: 08/03/15  8:56 PM  Result Value Ref Range Status   Specimen Description URINE, CLEAN CATCH  Final   Special Requests NONE  Final   Culture   Final    3,000 COLONIES/mL INSIGNIFICANT GROWTH Performed at [facility]    Report Status 08/04/2015 FINAL  Final     Studies: Dg Chest 2 View  08/03/2015  CLINICAL DATA:  Fever, shortness  of breath and weakness. Myeloma. Recent chemotherapy. EXAM: CHEST  2 VIEW COMPARISON:  07/27/2015. FINDINGS: The heart size and mediastinal contours are within normal limits. Both lungs are clear. Port-A-Cath good position, with tip in the proximal RIGHT atrium from a RIGHT subclavian approach. RIGHT shoulder arthroplasty. Degenerative change LEFT shoulder. Osteopenia but no definite thoracic compression deformity. RIGHT midlung zone granuloma. IMPRESSION: No active cardiopulmonary disease. Electronically Signed   By: Staci Righter M.D.   On: 08/03/2015 19:38    Scheduled Meds: . sodium chloride   Intravenous Once  . sodium chloride   Intravenous Once  . acyclovir  400 mg Oral BID  . allopurinol  300 mg Oral Daily  . carvedilol  3.125 mg Oral BID WC  . cholecalciferol  1,000 Units Oral Daily  . diphenhydrAMINE  25 mg Intravenous Once  . lactose free nutrition  237 mL Oral TID WC  . meropenem (MERREM) IV  1 g Intravenous 3 times per day  . mirtazapine  30 mg Oral QHS  . multivitamin with minerals  1 tablet Oral Daily  . potassium chloride  40 mEq Oral Q4H  . sodium chloride flush  10-40 mL Intracatheter Q12H  . vancomycin  500 mg Intravenous Q12H   Continuous Infusions: . sodium chloride Stopped (08/05/15 0100)   Antibiotics Given (last 72 hours)    Date/Time Action Medication Dose Rate   08/04/15 0424 Given   aztreonam (AZACTAM) 2 g in dextrose 5 % 50 mL IVPB 2 g 100 mL/hr   08/04/15 0938 Given   vancomycin (VANCOCIN) 500 mg in sodium chloride 0.9 % 100 mL IVPB 500 mg 100 mL/hr   08/04/15 0938 Given   acyclovir (ZOVIRAX) tablet 400 mg 400 mg    08/04/15 1507 Given   meropenem (MERREM) 1 g in sodium chloride 0.9 % 100 mL IVPB 1 g 200 mL/hr   08/04/15 2113 Given   acyclovir (ZOVIRAX) tablet 400 mg 400 mg    08/04/15 2114 Given   vancomycin (VANCOCIN) 500 mg in sodium chloride 0.9 % 100 mL IVPB 500 mg 100 mL/hr   08/04/15 2114 Given   meropenem (MERREM) 1 g in sodium chloride 0.9 %  100 mL IVPB 1 g 200 mL/hr   08/05/15 0842 Given   meropenem (MERREM) 1 g in sodium chloride 0.9 % 100 mL IVPB 1 g 200 mL/hr   08/05/15 1031 Given   acyclovir (ZOVIRAX) tablet 400 mg 400 mg    08/05/15 1032 Given   vancomycin (VANCOCIN) 500 mg in sodium chloride  0.9 % 100 mL IVPB 500 mg 100 mL/hr      Principal Problem:   Sepsis ([place]) Active Problems:   Hypertension   Multiple myeloma in relapse ([place])   MDS (myelodysplastic syndrome) ([place])   Fever    Time spent: 25 min    [facility] Pager [phone]. If 7PM-7AM, please contact night-coverage at [email or web address], password [email or web address] 08/05/2015, 2:19 PM  LOS: 2 days

## 2015-08-06 ENCOUNTER — Telehealth: Payer: Self-pay

## 2015-08-06 ENCOUNTER — Other Ambulatory Visit: Payer: Self-pay | Admitting: Hematology and Oncology

## 2015-08-06 ENCOUNTER — Other Ambulatory Visit: Payer: Medicare HMO

## 2015-08-06 ENCOUNTER — Telehealth: Payer: Self-pay | Admitting: Hematology and Oncology

## 2015-08-06 LAB — BASIC METABOLIC PANEL
Anion gap: 6 (ref 5–15)
BUN: 7 mg/dL (ref 6–20)
CO2: 25 mmol/L (ref 22–32)
Calcium: 8.1 mg/dL — ABNORMAL LOW (ref 8.9–10.3)
Chloride: 106 mmol/L (ref 101–111)
Creatinine, Ser: 0.72 mg/dL (ref 0.44–1.00)
GFR calc Af Amer: >60 mL/min (ref >60)
GFR calc non Af Amer: >60 mL/min (ref >60)
Glucose, Bld: 94 mg/dL (ref 65–99)
Potassium: 4 mmol/L (ref 3.5–5.1)
Sodium: 137 mmol/L (ref 135–145)

## 2015-08-06 LAB — TYPE AND SCREEN
ABO/RH(D): O POS
Antibody Screen: POSITIVE
DAT, IgG: NEGATIVE
Unit division: 0
Unit division: 0

## 2015-08-06 LAB — CBC
HCT: 27.5 % — ABNORMAL LOW (ref 36.0–46.0)
Hemoglobin: 9 g/dL — ABNORMAL LOW (ref 12.0–15.0)
MCH: 27.7 pg (ref 26.0–34.0)
MCHC: 32.7 g/dL (ref 30.0–36.0)
MCV: 84.6 fL (ref 78.0–100.0)
Platelets: 26 10*3/uL — CL (ref 150–400)
RBC: 3.25 MIL/uL — ABNORMAL LOW (ref 3.87–5.11)
RDW: 15.8 % — ABNORMAL HIGH (ref 11.5–15.5)
WBC: 1.9 10*3/uL — ABNORMAL LOW (ref 4.0–10.5)

## 2015-08-06 MED ORDER — SALINE SPRAY 0.65 % NA SOLN: 1.0000 | NASAL | Status: DC | PRN

## 2015-08-06 MED ORDER — HEPARIN SOD (PORK) LOCK FLUSH 100 UNIT/ML IV SOLN
500.0000 [IU] | INTRAVENOUS | Status: AC | PRN
  Administered 2015-08-06: 500 [IU]

## 2015-08-06 NOTE — Telephone Encounter (Signed)
Patient states she is under care of oncology team and does not have any primary care needs at this time.

## 2015-08-06 NOTE — Care Management Important Message (Signed)
Important Message  Patient Details  Name: ALLYSE RONDINELLI MRN: [id number] Date of Birth: [birth year]   Medicare Important Message Given:  Yes    Camillo Flaming 08/06/2015, 11:41 AMImportant Message  Patient Details  Name: TILA KIJEK MRN: [id number] Date of Birth: 26-Nov-1945   Medicare Important Message Given:  Yes    Camillo Flaming 08/06/2015, 11:41 AM

## 2015-08-06 NOTE — Discharge Summary (Signed)
Physician Discharge Summary  Jean Davidson NIO:[id number] DOB: [birth year] DOA: 08/03/2015  PCP: Jean Lofts, MD  Admit date: 08/03/2015 Discharge date: 08/06/2015  Time spent: 35 minutes  Recommendations for Outpatient Follow-up:  1. Patient to see ENT at [facility] on Tuesday 2. Patient to see Dr. Alvy Davidson and have labs on Monday   Discharge Diagnoses:  Principal Problem:   Sepsis [facility]) Active Problems:   Hypertension   Multiple myeloma in relapse ([place])   MDS (myelodysplastic syndrome) ([place])   Fever   Discharge Condition: improved  Diet recommendation: heart healthy  Filed Weights   08/03/15 1855 08/03/15 2348  Weight: 65.772 kg (145 lb) 67.1 kg (147 lb 14.9 oz)    History of present illness:  JOSEFA Davidson is a 70 y.o. female with history of relapsing multiple myeloma, pancytopenia, myelodysplastic syndrome presents to the ER because of weakness and fever and chills. Patient has been having these symptoms for almost 2 weeks now. Patient was recently placed on antibiotics for sinusitis by patient's oncologist. Patient is following up at [facility] for planned bone marrow transplant. Patient otherwise denies any chest pain shortness of breath nausea vomiting diarrhea. Patient states she has severe weakness and had her take rest while she was driving. In the ER patient was found to be febrile and lactic acid was elevated. Patient was also tachycardic. Patient has been admitted for possible sepsis unknown source.   Hospital Course:  Sepsis - source not clear. ? Sinusitis- spoke with ENT here---- can follow up at [facility]- they doubt fungic -blood cultures NGTD  Severe pancytopenia -  -transfused PRBC and plts  Multiple myeloma in relapse - per oncology  Myelodysplasia.  Hypertension  - Coreg.  Hypokalemia -replete   Procedures:    Consultations:    Discharge Exam: Filed Vitals:   08/05/15 2121 08/06/15 0620  BP: 127/59 130/64  Pulse: 85 73  Temp: 98.8 F (37.1 C) 98.9  F (37.2 C)  Resp: 18 18    General: feeling much better- wanting to go home   Discharge Instructions   Discharge Instructions    Diet general    Complete by:  As directed      Discharge instructions    Complete by:  As directed   Return to ER with fever Labs and appointment with Dr. Alvy Davidson on Monday ENT at [facility] on Tuesday     Increase activity slowly    Complete by:  As directed           Current Discharge Medication List    START taking these medications   Details  sodium chloride (OCEAN) 0.65 % SOLN nasal spray Place 1 spray into both nostrils as needed for congestion. Refills: 0      CONTINUE these medications which have NOT CHANGED   Details  acetaminophen (TYLENOL) 500 MG tablet Take 1,500 mg by mouth as needed for moderate pain, fever or headache.     acyclovir (ZOVIRAX) 400 MG tablet TAKE 1 TABLET (400 MG TOTAL) BY MOUTH 2 (TWO) TIMES DAILY. Qty: 180 tablet, Refills: 3    allopurinol (ZYLOPRIM) 300 MG tablet Take 300 mg by mouth daily.    ALPRAZolam (XANAX) 0.5 MG tablet Take 1 tablet (0.5 mg total) by mouth 2 (two) times daily as needed for anxiety. Qty: 60 tablet, Refills: 0    calcium carbonate (TUMS EX) 750 MG chewable tablet Chew 1 tablet by mouth 3 (three) times daily as needed for heartburn.     carvedilol (COREG) 6.25 MG  tablet Take 0.5 tablets (3.125 mg total) by mouth 2 (two) times daily with a meal. Qty: 90 tablet, Refills: 3    cholecalciferol (VITAMIN D) 1000 UNITS tablet Take 1,000 Units by mouth daily.   Associated Diagnoses: Multiple myeloma, without mention of having achieved remission    daratumumab 16 mg/kg in sodium chloride 0.9 % Inject 1,100 mg into the vein once.    docusate sodium (COLACE) 100 MG capsule Take 100 mg by mouth 2 (two) times daily as needed for mild constipation or moderate constipation.    Associated Diagnoses: Multiple myeloma, without mention of having achieved remission    lidocaine-prilocaine (EMLA) cream  Apply 1 application topically as needed.    loperamide (IMODIUM) 2 MG capsule Take by mouth as needed for diarrhea or loose stools.    mirtazapine (REMERON) 30 MG tablet Take 1 tablet (30 mg total) by mouth at bedtime. Qty: 90 tablet, Refills: 3   Associated Diagnoses: Abnormal weight loss    Multiple Vitamin (MULTIVITAMIN WITH MINERALS) TABS tablet Take 1 tablet by mouth daily.      STOP taking these medications     hydroxyurea (HYDREA) 500 MG capsule      levofloxacin (LEVAQUIN) 500 MG tablet      predniSONE (DELTASONE) 10 MG tablet        Allergies  Allergen Reactions  . Penicillins     UNKNOWN-CHILDHOOD ALLERGY   . Sulfa Antibiotics     UNKNOWN-CHILDHOOD ALLERGY   Follow-up Information    Follow up with Jean Lofts, MD In 1 week.   Specialty:  Family Medicine   Contact information:   [address] [phone]       Please follow up.   Why:  ENT at [facility] on Tuesday      Please follow up.   Why:  Dr. Alvy Davidson on Monday with labs       The results of significant diagnostics from this hospitalization (including imaging, microbiology, ancillary and laboratory) are listed below for reference.    Significant Diagnostic Studies: Dg Chest 2 View  08/03/2015  CLINICAL DATA:  Fever, shortness of breath and weakness. Myeloma. Recent chemotherapy. EXAM: CHEST  2 VIEW COMPARISON:  07/27/2015. FINDINGS: The heart size and mediastinal contours are within normal limits. Both lungs are clear. Port-A-Cath good position, with tip in the proximal RIGHT atrium from a RIGHT subclavian approach. RIGHT shoulder arthroplasty. Degenerative change LEFT shoulder. Osteopenia but no definite thoracic compression deformity. RIGHT midlung zone granuloma. IMPRESSION: No active cardiopulmonary disease. Electronically Signed   By: Staci Righter M.D.   On: 08/03/2015 19:38   Dg Chest 2 View  07/27/2015  CLINICAL DATA:  New onset of cough and fever and malaise; history  of multiple myeloma in remission. EXAM: CHEST  2 VIEW COMPARISON:  Chest x-ray of January 03, 2015 FINDINGS: The lungs are adequately inflated. There is no evidence of pneumonia. There is a stable 3 x 5 mm calcified nodule in the right lower lobe. The heart and pulmonary vascularity are normal. The mediastinum is normal in width. There is no pleural effusion or pneumothorax. The bony thorax exhibits no acute abnormalities. There is stable increased density within the anterior aspect of the left seventh rib. Previous right shoulder arthroplasty. The power port appliance tip projects over the midportion of the SVC. IMPRESSION: There is no evidence of pneumonia nor other acute cardiopulmonary abnormality. Electronically Signed   By: David  Martinique M.D.   On: 07/27/2015 11:53  Microbiology: Recent Results (from the past 240 hour(s))  Urine culture     Status: None   Collection Time: 07/27/15 11:58 AM  Result Value Ref Range Status   Urine Culture, Routine Final report  Final   Urine Culture result 1 Comment  Final    Comment: Mixed urogenital flora 10,000-25,000 colony forming units per mL   TECHNOLOGIST REVIEW     Status: None   Collection Time: 07/27/15 11:58 AM  Result Value Ref Range Status   Technologist Review Few Fragments  Final  Culture, blood (routine x 2)     Status: None (Preliminary result)   Collection Time: 08/03/15  7:50 PM  Result Value Ref Range Status   Specimen Description BLOOD PORTA CATH  Final   Special Requests BOTTLES DRAWN AEROBIC AND ANAEROBIC 5ML  Final   Culture   Final    NO GROWTH 2 DAYS Performed at [facility]    Report Status PENDING  Incomplete  Culture, blood (routine x 2)     Status: None (Preliminary result)   Collection Time: 08/03/15  8:07 PM  Result Value Ref Range Status   Specimen Description BLOOD LEFT HAND  Final   Special Requests BOTTLES DRAWN AEROBIC AND ANAEROBIC 5CC EACH  Final   Culture   Final    NO GROWTH 2 DAYS Performed at  [facility]    Report Status PENDING  Incomplete  Urine culture     Status: None   Collection Time: 08/03/15  8:56 PM  Result Value Ref Range Status   Specimen Description URINE, CLEAN CATCH  Final   Special Requests NONE  Final   Culture   Final    3,000 COLONIES/mL INSIGNIFICANT GROWTH Performed at [facility]    Report Status 08/04/2015 FINAL  Final     Labs: Basic Metabolic Panel:  Recent Labs Lab 08/03/15 1952 08/04/15 0321 08/05/15 0930 08/06/15 0530  NA 129* 136 137 137  K 3.1* 3.1* 3.0* 4.0  CL 97* 105 106 106  CO2 _0 GLUCOSE 130* 124* 164* 94  BUN 16 12 5* 7  CREATININE 0.91 0.80 0.70 0.72  CALCIUM 7.9* 7.2* 7.8* 8.1*   Liver Function Tests:  Recent Labs Lab 08/03/15 1952 08/04/15 0321  AST 15 11*  ALT 9* 8*  ALKPHOS 29* 24*  BILITOT 0.9 0.6  PROT 6.3* 5.0*  ALBUMIN 3.8 3.2*   No results for input(s): LIPASE, AMYLASE in the last 168 hours. No results for input(s): AMMONIA in the last 168 hours. CBC:  Recent Labs Lab 08/03/15 1952 08/04/15 0321 08/05/15 0930 08/06/15 0530  WBC 3.0* 1.5* 1.7* 1.9*  NEUTROABS 1.3* 0.6*  --   --   HGB 7.4* 6.0* 9.3* 9.0*  HCT 21.9* 18.3* 28.5* 27.5*  MCV 80.5 82.8 84.6 84.6  PLT 13* 11* 25* 26*   Cardiac Enzymes: No results for input(s): CKTOTAL, CKMB, CKMBINDEX, TROPONINI in the last 168 hours. BNP: BNP (last 3 results) No results for input(s): BNP in the last 8760 hours.  ProBNP (last 3 results) No results for input(s): PROBNP in the last 8760 hours.  CBG: No results for input(s): GLUCAP in the last 168 hours.     Signed:  Geradine Girt DO.  [facility] 08/06/2015, 10:53 AM

## 2015-08-06 NOTE — Telephone Encounter (Signed)
Spoke with patient to inform of added ov appointment on 2/13

## 2015-08-08 LAB — CULTURE, BLOOD (ROUTINE X 2)
Culture: NO GROWTH
Culture: NO GROWTH

## 2015-08-09 ENCOUNTER — Encounter: Payer: Self-pay | Admitting: Hematology and Oncology

## 2015-08-09 ENCOUNTER — Telehealth: Payer: Self-pay | Admitting: Hematology and Oncology

## 2015-08-09 ENCOUNTER — Ambulatory Visit (HOSPITAL_BASED_OUTPATIENT_CLINIC_OR_DEPARTMENT_OTHER): Payer: Medicare HMO | Admitting: Hematology and Oncology

## 2015-08-09 ENCOUNTER — Other Ambulatory Visit (HOSPITAL_BASED_OUTPATIENT_CLINIC_OR_DEPARTMENT_OTHER): Payer: Medicare HMO

## 2015-08-09 ENCOUNTER — Other Ambulatory Visit: Payer: Self-pay | Admitting: *Deleted

## 2015-08-09 VITALS — BP 127/80 | HR 79 | Temp 98.0°F | Resp 18 | Wt 147.2 lb

## 2015-08-09 DIAGNOSIS — D61818 Other pancytopenia: Secondary | ICD-10-CM | POA: Diagnosis not present

## 2015-08-09 DIAGNOSIS — C9002 Multiple myeloma in relapse: Secondary | ICD-10-CM

## 2015-08-09 DIAGNOSIS — D4620 Refractory anemia with excess of blasts, unspecified: Secondary | ICD-10-CM

## 2015-08-09 DIAGNOSIS — D469 Myelodysplastic syndrome, unspecified: Secondary | ICD-10-CM | POA: Diagnosis not present

## 2015-08-09 DIAGNOSIS — Z95828 Presence of other vascular implants and grafts: Secondary | ICD-10-CM

## 2015-08-09 DIAGNOSIS — Z9481 Bone marrow transplant status: Secondary | ICD-10-CM | POA: Diagnosis not present

## 2015-08-09 LAB — CBC WITH DIFFERENTIAL/PLATELET
BASO%: 0.5 % (ref 0.0–2.0)
Basophils Absolute: 0 10*3/uL (ref 0.0–0.1)
EOS%: 0.4 % (ref 0.0–7.0)
Eosinophils Absolute: 0 10*3/uL (ref 0.0–0.5)
HCT: 32.5 % — ABNORMAL LOW (ref 34.8–46.6)
HGB: 10.7 g/dL — ABNORMAL LOW (ref 11.6–15.9)
LYMPH%: 50.9 % — ABNORMAL HIGH (ref 14.0–49.7)
MCH: 26.9 pg (ref 25.1–34.0)
MCHC: 32.8 g/dL (ref 31.5–36.0)
MCV: 82.1 fL (ref 79.5–101.0)
MONO#: 0.2 10*3/uL (ref 0.1–0.9)
MONO%: 11.8 % (ref 0.0–14.0)
NEUT#: 0.7 10*3/uL — ABNORMAL LOW (ref 1.5–6.5)
NEUT%: 36.4 % — ABNORMAL LOW (ref 38.4–76.8)
Platelets: 18 10*3/uL — ABNORMAL LOW (ref 145–400)
RBC: 3.97 10*6/uL (ref 3.70–5.45)
RDW: 15.4 % — ABNORMAL HIGH (ref 11.2–14.5)
WBC: 1.8 10*3/uL — ABNORMAL LOW (ref 3.9–10.3)
lymph#: 0.9 10*3/uL (ref 0.9–3.3)

## 2015-08-09 LAB — COMPREHENSIVE METABOLIC PANEL
ALT: 10 U/L (ref 0–55)
AST: 12 U/L (ref 5–34)
Albumin: 3.8 g/dL (ref 3.5–5.0)
Alkaline Phosphatase: 37 U/L — ABNORMAL LOW (ref 40–150)
Anion Gap: 9 mEq/L (ref 3–11)
BUN: 13.8 mg/dL (ref 7.0–26.0)
CO2: 28 mEq/L (ref 22–29)
Calcium: 9.2 mg/dL (ref 8.4–10.4)
Chloride: 100 mEq/L (ref 98–109)
Creatinine: 0.8 mg/dL (ref 0.6–1.1)
EGFR: 76 mL/min/{1.73_m2} — ABNORMAL LOW (ref >90)
Glucose: 94 mg/dl (ref 70–140)
Potassium: 4.6 mEq/L (ref 3.5–5.1)
Sodium: 136 mEq/L (ref 136–145)
Total Bilirubin: 0.66 mg/dL (ref 0.20–1.20)
Total Protein: 6.5 g/dL (ref 6.4–8.3)

## 2015-08-09 LAB — TECHNOLOGIST REVIEW

## 2015-08-09 MED ORDER — HEPARIN SOD (PORK) LOCK FLUSH 100 UNIT/ML IV SOLN
500.0000 [IU] | Freq: Once | INTRAVENOUS | Status: AC
  Administered 2015-08-09: 500 [IU] via INTRAVENOUS
  Filled 2015-08-09: qty 5

## 2015-08-09 NOTE — Telephone Encounter (Signed)
per pof to sch pt appt-gave pt copy of avs °

## 2015-08-09 NOTE — Assessment & Plan Note (Signed)
She has follow-up with her physicians at [facility]. She has appointment to see them back in 3 days. In the meantime, we will continue supportive care transfusion and close monitoring and follow-up weekly here

## 2015-08-09 NOTE — Assessment & Plan Note (Signed)
Due to worsening pancytopenia, as per discussion with her physician at [facility], the patient is instructed to discontinue hydroxyurea. She will continue close follow-up at [facility] for future transplant

## 2015-08-09 NOTE — Assessment & Plan Note (Signed)
The patient is immunocompromised status post transplant. Over the past 2 months, she had been on multiple courses of antibiotic therapy without resolution. She has appointment to go back to [facility] tomorrow to see ENT in light of recent CT maxilla show evidence of sinusitis. I recommend ID consult at [facility] and her physician at [facility] will arrange for this

## 2015-08-09 NOTE — Assessment & Plan Note (Signed)
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 She is asymptomatic from the anemia. We will observe for now.  She does not require transfusion now.  She will receive 2 units of irradiated blood whenever her hemoglobin is less than 8 g. She will also get 1 unit of platelet whenever platelet count is less than 10,000 or if she has active bleeding. Per discussion with [facility], she will come here on a weekly basis on Mondays again her blood work check and transfusion as needed.

## 2015-08-09 NOTE — Progress Notes (Signed)
[place] OFFICE PROGRESS NOTE  Patient Care Team: Jinny Sanders, MD as PCP - General (Family Medicine) [name], MD as Referring Physician (Hematology and Oncology) [name], MD as Consulting Physician (Internal Medicine) Truman Hayward, MD as Consulting Physician (Infectious Diseases) Deanne Coffer An Nila Nephew, MD as Consulting Physician (Hematology and Oncology)  SUMMARY OF ONCOLOGIC HISTORY: Oncology History   Multiple myeloma, Ig A Lambda, M spike 3.54 grams, Calcium 9.2, Creatinine 0.8, Beta 2 microglobulin 4.52, IgA 4840 mg/dL, lambda light chain 75.4, albumin 3.6, hemoglobin 9.7, platelet 115    Primary site: Multiple Myeloma   Staging method: AJCC 6th Edition   Clinical: Stage IIA signed by [name], MD on 11/07/2013  2:46 PM   Summary: Stage IIA        Multiple myeloma in relapse ([place])   10/31/2013 Bone Marrow Biopsy Bone marrow biopsy confirmed multiple myeloma with 40% bone marrow involvement. Skeletal survey showed minimal lesions in her score with generalized demineralization   11/10/2013 - 02/13/2014 Chemotherapy The patient is started on induction chemotherapy with weekly dexamethasone 40 mg by mouth as well as Velcade subcutaneous injection on days 1, 4, 8 and 11. On 11/21/2013, she was started on monthly Zometa.   12/23/2013 Adverse Reaction The dose of Velcade was reduced due to thrombocytopenia.   01/28/2014 - 04/07/2014 Chemotherapy Revlimid is added. Treatment was discontinued due to lack of response.   02/24/2014 - 04/07/2014 Chemotherapy Due to worsening peripheral neuropathy, Velcade injection is changed to once a week. Revlimid was given 21 days on, 7 days off.   04/07/2014 - 04/10/2014 Chemotherapy Revlimid was discontinued due to lack of response. Chemotherapy was changed back to Velcade injection twice a week, 2 weeks on 1 week off. Her treatment was switched to to minimum response   04/20/2014 - 06/02/2014 Chemotherapy  chemotherapy is switched to Carfilzomib, Cytoxan and dexamethasone.   04/22/2014 Procedure she has placement of port for chemotherapy.   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 [facility]   10/20/2014 - 03/24/2015 Chemotherapy  she received chemotherapy with Kyprolis, Revlimid and dexamethasone   10/22/2014 Procedure She has port placement   01/19/2015 Tumor Marker IgA lambda M spike at 0.4 g    01/20/2015 Miscellaneous 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 No cource was found and fever resolved with IV vancomycin and meropenem    MDS (myelodysplastic syndrome) ([place])   04/06/2015 Bone Marrow Biopsy Accession: [id number] BM biopsy showed RAEB-1   04/06/2015 Tumor Marker Cytogenetics and FISH for MDS are within normal limits    INTERVAL HISTORY: Please see below for problem oriented charting. She is seen after recent discharge from the hospital for neutropenic fever. Since then, she denies further fever. She complained  of fatigue. She continues to have occasional nasal drainage. The patient denies any recent signs or symptoms of bleeding such as spontaneous epistaxis, hematuria or hematochezia.   REVIEW  OF SYSTEMS:   Constitutional: Denies fevers, chills or abnormal weight loss Eyes: Denies blurriness of vision Ears, nose, mouth, throat, and face: Denies mucositis or sore throat Respiratory: Denies cough, dyspnea or wheezes Cardiovascular: Denies palpitation, chest discomfort or lower extremity swelling Gastrointestinal:  Denies nausea, heartburn or change in bowel habits Skin: Denies abnormal skin rashes Lymphatics: Denies new lymphadenopathy Neurological:Denies numbness, tingling or new weaknesses Behavioral/Psych: Mood is stable, no new changes  All other systems were reviewed with the patient and are negative.  I have reviewed the past medical history, past surgical history, social history and family history with the patient and they are unchanged from previous note.  ALLERGIES:  is allergic to penicillins and sulfa antibiotics.  MEDICATIONS:  Current Outpatient Prescriptions  Medication Sig Dispense Refill  . acetaminophen (TYLENOL) 500 MG tablet Take 1,500 mg by mouth as needed for moderate pain, fever or headache.     Marland Kitchen acyclovir (ZOVIRAX) 400 MG tablet TAKE 1 TABLET (400 MG TOTAL) BY MOUTH 2 (TWO) TIMES DAILY. 180 tablet 3  . allopurinol (ZYLOPRIM) 300 MG tablet Take 300 mg by mouth daily.    Marland Kitchen ALPRAZolam (XANAX) 0.5 MG tablet Take 1 tablet (0.5 mg total) by mouth 2 (two) times daily as needed for anxiety. 60 tablet 0  . calcium carbonate (TUMS EX) 750 MG chewable tablet Chew 1 tablet by mouth 3 (three) times daily as needed for heartburn.     . carvedilol (COREG) 6.25 MG tablet Take 0.5 tablets (3.125 mg total) by mouth 2 (two) times daily with a meal. 90 tablet 3  . cholecalciferol (VITAMIN D) 1000 UNITS tablet Take 1,000 Units by mouth daily.    . daratumumab 16 mg/kg in sodium chloride 0.9 % Inject 1,100 mg into the vein once.    . docusate sodium (COLACE) 100 MG capsule Take 100 mg by mouth 2 (two) times daily as needed for mild constipation or moderate constipation.     .  lidocaine-prilocaine (EMLA) cream Apply 1 application topically as needed.    . loperamide (IMODIUM) 2 MG capsule Take by mouth as needed for diarrhea or loose stools.    . mirtazapine (REMERON) 30 MG tablet Take 1 tablet (30 mg total) by mouth at bedtime. 90 tablet 3  . Multiple Vitamin (MULTIVITAMIN WITH MINERALS) TABS tablet Take 1 tablet by mouth daily.    . predniSONE (DELTASONE) 10 MG tablet Take 10 mg by mouth as directed. Take for two days after treatment  0  . sodium chloride (OCEAN) 0.65 % SOLN nasal spray Place 1 spray into both nostrils as needed for congestion.  0   No current facility-administered medications for this visit.    PHYSICAL EXAMINATION: ECOG PERFORMANCE STATUS: 1 - Symptomatic but completely ambulatory  Filed Vitals:   08/09/15 1001  BP: 127/80  Pulse: 79  Temp: 98 F (36.7 C)  Resp: 18   Filed Weights   08/09/15 1001  Weight: 147 lb 3.2 oz (66.769 kg)    GENERAL:alert, no distress and comfortable. She looks pale SKIN: skin color, texture, turgor are normal, no rashes or significant lesions EYES: normal, Conjunctiva are pale and non-injected, sclera clear LUNGS: clear to auscultation and percussion with normal breathing effort HEART: regular rate & rhythm and no murmurs and no lower extremity edema ABDOMEN:abdomen soft, non-tender and normal bowel  sounds Musculoskeletal:no cyanosis of digits and no clubbing  NEURO: alert & oriented x 3 with fluent speech, no focal motor/sensory deficits  LABORATORY DATA:  I have reviewed the data as listed    Component Value Date/Time   NA 136 08/09/2015 0920   NA 137 08/06/2015 0530   K 4.6 08/09/2015 0920   K 4.0 08/06/2015 0530   CL 106 08/06/2015 0530   CO2 28 08/09/2015 0920   CO2 25 08/06/2015 0530   GLUCOSE 94 08/09/2015 0920   GLUCOSE 94 08/06/2015 0530   BUN 13.8 08/09/2015 0920   BUN 7 08/06/2015 0530   CREATININE 0.8 08/09/2015 0920   CREATININE 0.72 08/06/2015 0530   CALCIUM 9.2 08/09/2015  0920   CALCIUM 8.1* 08/06/2015 0530   PROT 6.5 08/09/2015 0920   PROT 5.0* 08/04/2015 0321   ALBUMIN 3.8 08/09/2015 0920   ALBUMIN 3.2* 08/04/2015 0321   AST 12 08/09/2015 0920   AST 11* 08/04/2015 0321   ALT 10 08/09/2015 0920   ALT 8* 08/04/2015 0321   ALKPHOS 37* 08/09/2015 0920   ALKPHOS 24* 08/04/2015 0321   BILITOT 0.66 08/09/2015 0920   BILITOT 0.6 08/04/2015 0321   GFRNONAA >60 08/06/2015 0530   GFRAA >60 08/06/2015 0530    No results found for: SPEP, UPEP  Lab Results  Component Value Date   WBC 1.8* 08/09/2015   NEUTROABS 0.7* 08/09/2015   HGB 10.7* 08/09/2015   HCT 32.5* 08/09/2015   MCV 82.1 08/09/2015   PLT 18* 08/09/2015      Chemistry      Component Value Date/Time   NA 136 08/09/2015 0920   NA 137 08/06/2015 0530   K 4.6 08/09/2015 0920   K 4.0 08/06/2015 0530   CL 106 08/06/2015 0530   CO2 28 08/09/2015 0920   CO2 25 08/06/2015 0530   BUN 13.8 08/09/2015 0920   BUN 7 08/06/2015 0530   CREATININE 0.8 08/09/2015 0920   CREATININE 0.72 08/06/2015 0530      Component Value Date/Time   CALCIUM 9.2 08/09/2015 0920   CALCIUM 8.1* 08/06/2015 0530   ALKPHOS 37* 08/09/2015 0920   ALKPHOS 24* 08/04/2015 0321   AST 12 08/09/2015 0920   AST 11* 08/04/2015 0321   ALT 10 08/09/2015 0920   ALT 8* 08/04/2015 0321   BILITOT 0.66 08/09/2015 0920   BILITOT 0.6 08/04/2015 0321      ASSESSMENT & PLAN:   Multiple myeloma in relapse [facility]) She has follow-up with her physicians at [facility]. She has appointment to see them back in 3 days. In the meantime, we will continue supportive care transfusion and close monitoring and follow-up weekly here  MDS (myelodysplastic syndrome) ([place]) Due to worsening pancytopenia, as per discussion with her physician at [facility], the patient is instructed to discontinue hydroxyurea. 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  She is asymptomatic from the anemia. We will observe for now.  She does not require transfusion now.  She will receive 2 units of irradiated blood whenever her hemoglobin is less than 8 g. She will also get 1 unit of platelet whenever platelet count is less than 10,000 or if she has active bleeding. Per discussion with [facility], she will come here on a weekly basis on Mondays again her blood work check and transfusion as needed.  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 Over the  past 2 months, she had been on multiple courses of antibiotic therapy without resolution. She has appointment to go back to [facility] tomorrow to see ENT in light of recent CT maxilla show evidence of sinusitis. I recommend ID consult at [facility] and her physician at [facility] will arrange for this     All questions were answered. The patient knows to call the clinic with any problems, questions or concerns. No barriers to learning was detected. I spent 25 minutes counseling the patient face to face. The total time spent in the appointment was 30 minutes and more than 50% was on counseling and review of test results     [facility], [place], MD 08/09/2015 12:51 PM

## 2015-08-10 DIAGNOSIS — J0140 Acute pansinusitis, unspecified: Secondary | ICD-10-CM | POA: Diagnosis not present

## 2015-08-10 DIAGNOSIS — D469 Myelodysplastic syndrome, unspecified: Secondary | ICD-10-CM | POA: Diagnosis not present

## 2015-08-10 DIAGNOSIS — B9689 Other specified bacterial agents as the cause of diseases classified elsewhere: Secondary | ICD-10-CM | POA: Diagnosis not present

## 2015-08-10 DIAGNOSIS — C9000 Multiple myeloma not having achieved remission: Secondary | ICD-10-CM | POA: Diagnosis not present

## 2015-08-10 DIAGNOSIS — D709 Neutropenia, unspecified: Secondary | ICD-10-CM | POA: Diagnosis not present

## 2015-08-10 DIAGNOSIS — J019 Acute sinusitis, unspecified: Secondary | ICD-10-CM | POA: Diagnosis not present

## 2015-08-10 DIAGNOSIS — Z7682 Awaiting organ transplant status: Secondary | ICD-10-CM | POA: Diagnosis not present

## 2015-08-10 DIAGNOSIS — R5081 Fever presenting with conditions classified elsewhere: Secondary | ICD-10-CM | POA: Diagnosis not present

## 2015-08-12 ENCOUNTER — Encounter: Payer: Self-pay | Admitting: Hematology and Oncology

## 2015-08-12 DIAGNOSIS — Z5112 Encounter for antineoplastic immunotherapy: Secondary | ICD-10-CM | POA: Diagnosis not present

## 2015-08-12 DIAGNOSIS — D469 Myelodysplastic syndrome, unspecified: Secondary | ICD-10-CM | POA: Diagnosis not present

## 2015-08-12 DIAGNOSIS — R5081 Fever presenting with conditions classified elsewhere: Secondary | ICD-10-CM | POA: Diagnosis not present

## 2015-08-12 DIAGNOSIS — D696 Thrombocytopenia, unspecified: Secondary | ICD-10-CM | POA: Diagnosis not present

## 2015-08-12 DIAGNOSIS — D709 Neutropenia, unspecified: Secondary | ICD-10-CM | POA: Diagnosis not present

## 2015-08-12 DIAGNOSIS — Z7682 Awaiting organ transplant status: Secondary | ICD-10-CM | POA: Diagnosis not present

## 2015-08-12 DIAGNOSIS — C9000 Multiple myeloma not having achieved remission: Secondary | ICD-10-CM | POA: Diagnosis not present

## 2015-08-12 DIAGNOSIS — J329 Chronic sinusitis, unspecified: Secondary | ICD-10-CM | POA: Diagnosis not present

## 2015-08-12 DIAGNOSIS — C9002 Multiple myeloma in relapse: Secondary | ICD-10-CM | POA: Diagnosis not present

## 2015-08-12 DIAGNOSIS — R918 Other nonspecific abnormal finding of lung field: Secondary | ICD-10-CM | POA: Diagnosis not present

## 2015-08-12 DIAGNOSIS — R768 Other specified abnormal immunological findings in serum: Secondary | ICD-10-CM | POA: Diagnosis not present

## 2015-08-12 NOTE — Progress Notes (Signed)
UR review done for entire stay at request of insurance company

## 2015-08-13 ENCOUNTER — Telehealth: Payer: Self-pay | Admitting: *Deleted

## 2015-08-13 ENCOUNTER — Other Ambulatory Visit: Payer: Medicare HMO

## 2015-08-13 NOTE — Telephone Encounter (Signed)
OK I cancelled her appt on 2/20 but kept the 2/27 just in case

## 2015-08-13 NOTE — Telephone Encounter (Signed)
Pt called requesting to cancel all appts  with Dr. Alvy Bimler for 08/16/15.  Stated she is going to [facility] on Tues 08/17/15.   Pt stated she will call office for rescheduling appts as appropriate  after her consultation at [facility].

## 2015-08-13 NOTE — Telephone Encounter (Signed)
Informed pt that appts for 2/20 have been cancelled by md.  Instructed pt to keep appt 2/27 as scheduled just in case.  Pt voiced understanding.

## 2015-08-16 ENCOUNTER — Inpatient Hospital Stay: Payer: Medicare HMO

## 2015-08-16 ENCOUNTER — Ambulatory Visit: Payer: Medicare HMO | Admitting: Hematology and Oncology

## 2015-08-16 ENCOUNTER — Other Ambulatory Visit: Payer: Medicare HMO

## 2015-08-17 DIAGNOSIS — D469 Myelodysplastic syndrome, unspecified: Secondary | ICD-10-CM | POA: Diagnosis not present

## 2015-08-17 DIAGNOSIS — Z79899 Other long term (current) drug therapy: Secondary | ICD-10-CM | POA: Diagnosis not present

## 2015-08-17 DIAGNOSIS — C9000 Multiple myeloma not having achieved remission: Secondary | ICD-10-CM | POA: Diagnosis not present

## 2015-08-17 DIAGNOSIS — Z5112 Encounter for antineoplastic immunotherapy: Secondary | ICD-10-CM | POA: Diagnosis not present

## 2015-08-18 ENCOUNTER — Encounter: Payer: Self-pay | Admitting: Hematology and Oncology

## 2015-08-18 ENCOUNTER — Other Ambulatory Visit: Payer: Self-pay | Admitting: Hematology and Oncology

## 2015-08-18 ENCOUNTER — Telehealth: Payer: Self-pay | Admitting: Hematology and Oncology

## 2015-08-18 NOTE — Telephone Encounter (Signed)
Patient called to cx 2/27 appointments. Per patient no reschedule at this time - she is receiving care at [facility]. Left message for desk nurse.

## 2015-08-20 ENCOUNTER — Other Ambulatory Visit: Payer: Medicare HMO

## 2015-08-23 ENCOUNTER — Other Ambulatory Visit: Payer: Medicare HMO

## 2015-08-23 DIAGNOSIS — Z5112 Encounter for antineoplastic immunotherapy: Secondary | ICD-10-CM | POA: Diagnosis not present

## 2015-08-23 DIAGNOSIS — C9002 Multiple myeloma in relapse: Secondary | ICD-10-CM | POA: Diagnosis not present

## 2015-08-23 DIAGNOSIS — D479 Neoplasm of uncertain behavior of lymphoid, hematopoietic and related tissue, unspecified: Secondary | ICD-10-CM | POA: Diagnosis not present

## 2015-08-24 DIAGNOSIS — Z8579 Personal history of other malignant neoplasms of lymphoid, hematopoietic and related tissues: Secondary | ICD-10-CM | POA: Diagnosis not present

## 2015-08-27 ENCOUNTER — Other Ambulatory Visit: Payer: Medicare HMO

## 2015-08-27 DIAGNOSIS — I8310 Varicose veins of unspecified lower extremity with inflammation: Secondary | ICD-10-CM | POA: Diagnosis not present

## 2015-08-27 DIAGNOSIS — I70213 Atherosclerosis of native arteries of extremities with intermittent claudication, bilateral legs: Secondary | ICD-10-CM | POA: Diagnosis not present

## 2015-08-27 DIAGNOSIS — I6523 Occlusion and stenosis of bilateral carotid arteries: Secondary | ICD-10-CM | POA: Diagnosis not present

## 2015-08-27 DIAGNOSIS — M79609 Pain in unspecified limb: Secondary | ICD-10-CM | POA: Diagnosis not present

## 2015-08-31 ENCOUNTER — Ambulatory Visit: Payer: Medicare HMO | Admitting: Cardiovascular Disease

## 2015-08-31 DIAGNOSIS — Z5112 Encounter for antineoplastic immunotherapy: Secondary | ICD-10-CM | POA: Diagnosis not present

## 2015-08-31 DIAGNOSIS — C9002 Multiple myeloma in relapse: Secondary | ICD-10-CM | POA: Diagnosis not present

## 2015-09-01 DIAGNOSIS — C9000 Multiple myeloma not having achieved remission: Secondary | ICD-10-CM | POA: Diagnosis not present

## 2015-09-03 ENCOUNTER — Other Ambulatory Visit: Payer: Medicare HMO

## 2015-09-07 DIAGNOSIS — C9002 Multiple myeloma in relapse: Secondary | ICD-10-CM | POA: Diagnosis not present

## 2015-09-07 DIAGNOSIS — D479 Neoplasm of uncertain behavior of lymphoid, hematopoietic and related tissue, unspecified: Secondary | ICD-10-CM | POA: Diagnosis not present

## 2015-09-07 DIAGNOSIS — R768 Other specified abnormal immunological findings in serum: Secondary | ICD-10-CM | POA: Diagnosis not present

## 2015-09-07 DIAGNOSIS — Z5112 Encounter for antineoplastic immunotherapy: Secondary | ICD-10-CM | POA: Diagnosis not present

## 2015-09-08 DIAGNOSIS — R0600 Dyspnea, unspecified: Secondary | ICD-10-CM | POA: Diagnosis not present

## 2015-09-08 DIAGNOSIS — Z6740 Type O blood, Rh positive: Secondary | ICD-10-CM | POA: Diagnosis not present

## 2015-09-08 DIAGNOSIS — R768 Other specified abnormal immunological findings in serum: Secondary | ICD-10-CM | POA: Diagnosis not present

## 2015-09-08 DIAGNOSIS — D479 Neoplasm of uncertain behavior of lymphoid, hematopoietic and related tissue, unspecified: Secondary | ICD-10-CM | POA: Diagnosis not present

## 2015-09-08 DIAGNOSIS — C9002 Multiple myeloma in relapse: Secondary | ICD-10-CM | POA: Diagnosis not present

## 2015-09-08 DIAGNOSIS — D469 Myelodysplastic syndrome, unspecified: Secondary | ICD-10-CM | POA: Diagnosis not present

## 2015-09-08 DIAGNOSIS — R5383 Other fatigue: Secondary | ICD-10-CM | POA: Diagnosis not present

## 2015-09-10 ENCOUNTER — Other Ambulatory Visit: Payer: Medicare HMO

## 2015-09-13 DIAGNOSIS — C9002 Multiple myeloma in relapse: Secondary | ICD-10-CM | POA: Diagnosis not present

## 2015-09-13 DIAGNOSIS — R69 Illness, unspecified: Secondary | ICD-10-CM | POA: Diagnosis not present

## 2015-09-13 DIAGNOSIS — S2243XD Multiple fractures of ribs, bilateral, subsequent encounter for fracture with routine healing: Secondary | ICD-10-CM | POA: Diagnosis not present

## 2015-09-13 DIAGNOSIS — Z5111 Encounter for antineoplastic chemotherapy: Secondary | ICD-10-CM | POA: Diagnosis not present

## 2015-09-13 DIAGNOSIS — C9000 Multiple myeloma not having achieved remission: Secondary | ICD-10-CM | POA: Diagnosis not present

## 2015-09-13 DIAGNOSIS — R911 Solitary pulmonary nodule: Secondary | ICD-10-CM | POA: Diagnosis not present

## 2015-09-13 DIAGNOSIS — I517 Cardiomegaly: Secondary | ICD-10-CM | POA: Diagnosis not present

## 2015-09-13 DIAGNOSIS — D469 Myelodysplastic syndrome, unspecified: Secondary | ICD-10-CM | POA: Diagnosis not present

## 2015-09-13 DIAGNOSIS — Z79899 Other long term (current) drug therapy: Secondary | ICD-10-CM | POA: Diagnosis not present

## 2015-09-13 DIAGNOSIS — Y33XXXD Other specified events, undetermined intent, subsequent encounter: Secondary | ICD-10-CM | POA: Diagnosis not present

## 2015-09-13 DIAGNOSIS — D479 Neoplasm of uncertain behavior of lymphoid, hematopoietic and related tissue, unspecified: Secondary | ICD-10-CM | POA: Diagnosis not present

## 2015-09-13 DIAGNOSIS — Z8579 Personal history of other malignant neoplasms of lymphoid, hematopoietic and related tissues: Secondary | ICD-10-CM | POA: Diagnosis not present

## 2015-09-14 DIAGNOSIS — D479 Neoplasm of uncertain behavior of lymphoid, hematopoietic and related tissue, unspecified: Secondary | ICD-10-CM | POA: Diagnosis not present

## 2015-09-14 DIAGNOSIS — C9002 Multiple myeloma in relapse: Secondary | ICD-10-CM | POA: Diagnosis not present

## 2015-09-14 DIAGNOSIS — Z5112 Encounter for antineoplastic immunotherapy: Secondary | ICD-10-CM | POA: Diagnosis not present

## 2015-09-16 ENCOUNTER — Ambulatory Visit: Payer: Medicare HMO | Admitting: Cardiovascular Disease

## 2015-09-16 ENCOUNTER — Other Ambulatory Visit: Payer: Self-pay | Admitting: Hematology and Oncology

## 2015-09-21 DIAGNOSIS — Z5112 Encounter for antineoplastic immunotherapy: Secondary | ICD-10-CM | POA: Diagnosis not present

## 2015-09-21 DIAGNOSIS — D479 Neoplasm of uncertain behavior of lymphoid, hematopoietic and related tissue, unspecified: Secondary | ICD-10-CM | POA: Diagnosis not present

## 2015-09-21 DIAGNOSIS — D469 Myelodysplastic syndrome, unspecified: Secondary | ICD-10-CM | POA: Diagnosis not present

## 2015-09-21 DIAGNOSIS — Z79899 Other long term (current) drug therapy: Secondary | ICD-10-CM | POA: Diagnosis not present

## 2015-09-21 DIAGNOSIS — C9002 Multiple myeloma in relapse: Secondary | ICD-10-CM | POA: Diagnosis not present

## 2015-09-22 DIAGNOSIS — D469 Myelodysplastic syndrome, unspecified: Secondary | ICD-10-CM | POA: Diagnosis not present

## 2015-09-28 DIAGNOSIS — R58 Hemorrhage, not elsewhere classified: Secondary | ICD-10-CM | POA: Diagnosis not present

## 2015-09-28 DIAGNOSIS — Z79899 Other long term (current) drug therapy: Secondary | ICD-10-CM | POA: Diagnosis not present

## 2015-09-28 DIAGNOSIS — R5383 Other fatigue: Secondary | ICD-10-CM | POA: Diagnosis not present

## 2015-09-28 DIAGNOSIS — R69 Illness, unspecified: Secondary | ICD-10-CM | POA: Diagnosis not present

## 2015-09-28 DIAGNOSIS — C9002 Multiple myeloma in relapse: Secondary | ICD-10-CM | POA: Diagnosis not present

## 2015-09-28 DIAGNOSIS — R768 Other specified abnormal immunological findings in serum: Secondary | ICD-10-CM | POA: Diagnosis not present

## 2015-09-28 DIAGNOSIS — D479 Neoplasm of uncertain behavior of lymphoid, hematopoietic and related tissue, unspecified: Secondary | ICD-10-CM | POA: Diagnosis not present

## 2015-10-04 DIAGNOSIS — D479 Neoplasm of uncertain behavior of lymphoid, hematopoietic and related tissue, unspecified: Secondary | ICD-10-CM | POA: Diagnosis not present

## 2015-10-04 DIAGNOSIS — D469 Myelodysplastic syndrome, unspecified: Secondary | ICD-10-CM | POA: Diagnosis not present

## 2015-10-04 DIAGNOSIS — D61818 Other pancytopenia: Secondary | ICD-10-CM | POA: Diagnosis not present

## 2015-10-04 DIAGNOSIS — Z9484 Stem cells transplant status: Secondary | ICD-10-CM | POA: Diagnosis not present

## 2015-10-04 DIAGNOSIS — Z79899 Other long term (current) drug therapy: Secondary | ICD-10-CM | POA: Diagnosis not present

## 2015-10-04 DIAGNOSIS — C9002 Multiple myeloma in relapse: Secondary | ICD-10-CM | POA: Diagnosis not present

## 2015-10-05 DIAGNOSIS — R768 Other specified abnormal immunological findings in serum: Secondary | ICD-10-CM | POA: Diagnosis not present

## 2015-10-05 DIAGNOSIS — C9000 Multiple myeloma not having achieved remission: Secondary | ICD-10-CM | POA: Diagnosis not present

## 2015-10-05 DIAGNOSIS — Z9484 Stem cells transplant status: Secondary | ICD-10-CM | POA: Diagnosis not present

## 2015-10-05 DIAGNOSIS — R918 Other nonspecific abnormal finding of lung field: Secondary | ICD-10-CM | POA: Diagnosis not present

## 2015-10-05 DIAGNOSIS — R109 Unspecified abdominal pain: Secondary | ICD-10-CM | POA: Diagnosis not present

## 2015-10-05 DIAGNOSIS — Z9481 Bone marrow transplant status: Secondary | ICD-10-CM | POA: Diagnosis not present

## 2015-10-05 DIAGNOSIS — Z789 Other specified health status: Secondary | ICD-10-CM | POA: Diagnosis not present

## 2015-10-05 DIAGNOSIS — D469 Myelodysplastic syndrome, unspecified: Secondary | ICD-10-CM | POA: Diagnosis not present

## 2015-10-05 DIAGNOSIS — D61818 Other pancytopenia: Secondary | ICD-10-CM | POA: Diagnosis not present

## 2015-10-05 DIAGNOSIS — Z5111 Encounter for antineoplastic chemotherapy: Secondary | ICD-10-CM | POA: Diagnosis not present

## 2015-10-05 DIAGNOSIS — I10 Essential (primary) hypertension: Secondary | ICD-10-CM | POA: Diagnosis not present

## 2015-10-05 DIAGNOSIS — Z452 Encounter for adjustment and management of vascular access device: Secondary | ICD-10-CM | POA: Diagnosis not present

## 2015-10-05 DIAGNOSIS — K649 Unspecified hemorrhoids: Secondary | ICD-10-CM | POA: Diagnosis not present

## 2015-10-05 DIAGNOSIS — T451X5A Adverse effect of antineoplastic and immunosuppressive drugs, initial encounter: Secondary | ICD-10-CM | POA: Diagnosis not present

## 2015-10-05 DIAGNOSIS — D801 Nonfamilial hypogammaglobulinemia: Secondary | ICD-10-CM | POA: Diagnosis not present

## 2015-10-05 DIAGNOSIS — R1312 Dysphagia, oropharyngeal phase: Secondary | ICD-10-CM | POA: Diagnosis not present

## 2015-10-05 DIAGNOSIS — D61810 Antineoplastic chemotherapy induced pancytopenia: Secondary | ICD-10-CM | POA: Diagnosis not present

## 2015-10-05 DIAGNOSIS — K922 Gastrointestinal hemorrhage, unspecified: Secondary | ICD-10-CM | POA: Diagnosis not present

## 2015-10-05 DIAGNOSIS — R197 Diarrhea, unspecified: Secondary | ICD-10-CM | POA: Diagnosis not present

## 2015-10-05 DIAGNOSIS — D709 Neutropenia, unspecified: Secondary | ICD-10-CM | POA: Diagnosis not present

## 2015-10-05 DIAGNOSIS — D479 Neoplasm of uncertain behavior of lymphoid, hematopoietic and related tissue, unspecified: Secondary | ICD-10-CM | POA: Diagnosis not present

## 2015-10-05 DIAGNOSIS — Z96611 Presence of right artificial shoulder joint: Secondary | ICD-10-CM | POA: Diagnosis not present

## 2015-10-05 DIAGNOSIS — K1231 Oral mucositis (ulcerative) due to antineoplastic therapy: Secondary | ICD-10-CM | POA: Diagnosis not present

## 2015-10-05 DIAGNOSIS — G912 (Idiopathic) normal pressure hydrocephalus: Secondary | ICD-10-CM | POA: Diagnosis not present

## 2015-10-05 DIAGNOSIS — Z52001 Unspecified donor, stem cells: Secondary | ICD-10-CM | POA: Diagnosis not present

## 2015-10-05 DIAGNOSIS — R0602 Shortness of breath: Secondary | ICD-10-CM | POA: Diagnosis not present

## 2015-10-06 DIAGNOSIS — D479 Neoplasm of uncertain behavior of lymphoid, hematopoietic and related tissue, unspecified: Secondary | ICD-10-CM | POA: Diagnosis not present

## 2015-10-06 DIAGNOSIS — C9000 Multiple myeloma not having achieved remission: Secondary | ICD-10-CM | POA: Diagnosis not present

## 2015-10-06 DIAGNOSIS — Z5111 Encounter for antineoplastic chemotherapy: Secondary | ICD-10-CM | POA: Diagnosis not present

## 2015-10-07 DIAGNOSIS — D479 Neoplasm of uncertain behavior of lymphoid, hematopoietic and related tissue, unspecified: Secondary | ICD-10-CM | POA: Diagnosis not present

## 2015-10-07 DIAGNOSIS — Z5111 Encounter for antineoplastic chemotherapy: Secondary | ICD-10-CM | POA: Diagnosis not present

## 2015-10-07 DIAGNOSIS — C9000 Multiple myeloma not having achieved remission: Secondary | ICD-10-CM | POA: Diagnosis not present

## 2015-10-08 DIAGNOSIS — Z5111 Encounter for antineoplastic chemotherapy: Secondary | ICD-10-CM | POA: Diagnosis not present

## 2015-10-08 DIAGNOSIS — D479 Neoplasm of uncertain behavior of lymphoid, hematopoietic and related tissue, unspecified: Secondary | ICD-10-CM | POA: Diagnosis not present

## 2015-10-08 DIAGNOSIS — C9000 Multiple myeloma not having achieved remission: Secondary | ICD-10-CM | POA: Diagnosis not present

## 2015-10-09 DIAGNOSIS — Z5111 Encounter for antineoplastic chemotherapy: Secondary | ICD-10-CM | POA: Diagnosis not present

## 2015-10-09 DIAGNOSIS — D479 Neoplasm of uncertain behavior of lymphoid, hematopoietic and related tissue, unspecified: Secondary | ICD-10-CM | POA: Diagnosis not present

## 2015-10-09 DIAGNOSIS — C9000 Multiple myeloma not having achieved remission: Secondary | ICD-10-CM | POA: Diagnosis not present

## 2015-10-10 DIAGNOSIS — R768 Other specified abnormal immunological findings in serum: Secondary | ICD-10-CM | POA: Diagnosis not present

## 2015-10-10 DIAGNOSIS — D479 Neoplasm of uncertain behavior of lymphoid, hematopoietic and related tissue, unspecified: Secondary | ICD-10-CM | POA: Diagnosis not present

## 2015-10-10 DIAGNOSIS — C9000 Multiple myeloma not having achieved remission: Secondary | ICD-10-CM | POA: Diagnosis not present

## 2015-10-10 DIAGNOSIS — Z5111 Encounter for antineoplastic chemotherapy: Secondary | ICD-10-CM | POA: Diagnosis not present

## 2015-10-11 DIAGNOSIS — Z52001 Unspecified donor, stem cells: Secondary | ICD-10-CM | POA: Diagnosis not present

## 2015-10-11 DIAGNOSIS — D479 Neoplasm of uncertain behavior of lymphoid, hematopoietic and related tissue, unspecified: Secondary | ICD-10-CM | POA: Diagnosis not present

## 2015-10-11 DIAGNOSIS — C9000 Multiple myeloma not having achieved remission: Secondary | ICD-10-CM | POA: Diagnosis not present

## 2015-10-11 DIAGNOSIS — Z5111 Encounter for antineoplastic chemotherapy: Secondary | ICD-10-CM | POA: Diagnosis not present

## 2015-10-12 DIAGNOSIS — D479 Neoplasm of uncertain behavior of lymphoid, hematopoietic and related tissue, unspecified: Secondary | ICD-10-CM | POA: Diagnosis not present

## 2015-10-12 DIAGNOSIS — Z52001 Unspecified donor, stem cells: Secondary | ICD-10-CM | POA: Diagnosis not present

## 2015-10-12 DIAGNOSIS — Z5111 Encounter for antineoplastic chemotherapy: Secondary | ICD-10-CM | POA: Diagnosis not present

## 2015-10-12 DIAGNOSIS — C9000 Multiple myeloma not having achieved remission: Secondary | ICD-10-CM | POA: Diagnosis not present

## 2015-10-13 DIAGNOSIS — D479 Neoplasm of uncertain behavior of lymphoid, hematopoietic and related tissue, unspecified: Secondary | ICD-10-CM | POA: Diagnosis not present

## 2015-10-13 DIAGNOSIS — C9000 Multiple myeloma not having achieved remission: Secondary | ICD-10-CM | POA: Diagnosis not present

## 2015-10-13 DIAGNOSIS — Z5111 Encounter for antineoplastic chemotherapy: Secondary | ICD-10-CM | POA: Diagnosis not present

## 2015-10-14 DIAGNOSIS — C9000 Multiple myeloma not having achieved remission: Secondary | ICD-10-CM | POA: Diagnosis not present

## 2015-10-14 DIAGNOSIS — D479 Neoplasm of uncertain behavior of lymphoid, hematopoietic and related tissue, unspecified: Secondary | ICD-10-CM | POA: Diagnosis not present

## 2015-10-14 DIAGNOSIS — Z5111 Encounter for antineoplastic chemotherapy: Secondary | ICD-10-CM | POA: Diagnosis not present

## 2015-10-16 DIAGNOSIS — Z9481 Bone marrow transplant status: Secondary | ICD-10-CM | POA: Diagnosis not present

## 2015-10-16 DIAGNOSIS — D479 Neoplasm of uncertain behavior of lymphoid, hematopoietic and related tissue, unspecified: Secondary | ICD-10-CM | POA: Diagnosis not present

## 2015-10-16 DIAGNOSIS — C9000 Multiple myeloma not having achieved remission: Secondary | ICD-10-CM | POA: Diagnosis not present

## 2015-10-17 DIAGNOSIS — D479 Neoplasm of uncertain behavior of lymphoid, hematopoietic and related tissue, unspecified: Secondary | ICD-10-CM | POA: Diagnosis not present

## 2015-10-17 DIAGNOSIS — Z9481 Bone marrow transplant status: Secondary | ICD-10-CM | POA: Diagnosis not present

## 2015-10-17 DIAGNOSIS — C9000 Multiple myeloma not having achieved remission: Secondary | ICD-10-CM | POA: Diagnosis not present

## 2015-10-18 DIAGNOSIS — C9000 Multiple myeloma not having achieved remission: Secondary | ICD-10-CM | POA: Diagnosis not present

## 2015-10-18 DIAGNOSIS — D479 Neoplasm of uncertain behavior of lymphoid, hematopoietic and related tissue, unspecified: Secondary | ICD-10-CM | POA: Diagnosis not present

## 2015-10-19 DIAGNOSIS — R768 Other specified abnormal immunological findings in serum: Secondary | ICD-10-CM | POA: Diagnosis not present

## 2015-10-20 DIAGNOSIS — R109 Unspecified abdominal pain: Secondary | ICD-10-CM | POA: Diagnosis not present

## 2015-10-20 DIAGNOSIS — Z452 Encounter for adjustment and management of vascular access device: Secondary | ICD-10-CM | POA: Diagnosis not present

## 2015-10-20 DIAGNOSIS — C9000 Multiple myeloma not having achieved remission: Secondary | ICD-10-CM | POA: Diagnosis not present

## 2015-10-20 DIAGNOSIS — D479 Neoplasm of uncertain behavior of lymphoid, hematopoietic and related tissue, unspecified: Secondary | ICD-10-CM | POA: Diagnosis not present

## 2015-10-20 DIAGNOSIS — Z789 Other specified health status: Secondary | ICD-10-CM | POA: Diagnosis not present

## 2015-10-20 DIAGNOSIS — Z96611 Presence of right artificial shoulder joint: Secondary | ICD-10-CM | POA: Diagnosis not present

## 2015-10-20 DIAGNOSIS — K1231 Oral mucositis (ulcerative) due to antineoplastic therapy: Secondary | ICD-10-CM | POA: Diagnosis not present

## 2015-10-20 DIAGNOSIS — R197 Diarrhea, unspecified: Secondary | ICD-10-CM | POA: Diagnosis not present

## 2015-10-21 DIAGNOSIS — D479 Neoplasm of uncertain behavior of lymphoid, hematopoietic and related tissue, unspecified: Secondary | ICD-10-CM | POA: Diagnosis not present

## 2015-10-21 DIAGNOSIS — R197 Diarrhea, unspecified: Secondary | ICD-10-CM | POA: Diagnosis not present

## 2015-10-21 DIAGNOSIS — K1231 Oral mucositis (ulcerative) due to antineoplastic therapy: Secondary | ICD-10-CM | POA: Diagnosis not present

## 2015-10-21 DIAGNOSIS — Z789 Other specified health status: Secondary | ICD-10-CM | POA: Diagnosis not present

## 2015-10-23 DIAGNOSIS — K1231 Oral mucositis (ulcerative) due to antineoplastic therapy: Secondary | ICD-10-CM | POA: Diagnosis not present

## 2015-10-23 DIAGNOSIS — Z5111 Encounter for antineoplastic chemotherapy: Secondary | ICD-10-CM | POA: Diagnosis not present

## 2015-10-23 DIAGNOSIS — R197 Diarrhea, unspecified: Secondary | ICD-10-CM | POA: Diagnosis not present

## 2015-10-23 DIAGNOSIS — R768 Other specified abnormal immunological findings in serum: Secondary | ICD-10-CM | POA: Diagnosis not present

## 2015-10-23 DIAGNOSIS — R918 Other nonspecific abnormal finding of lung field: Secondary | ICD-10-CM | POA: Diagnosis not present

## 2015-10-23 DIAGNOSIS — Z9481 Bone marrow transplant status: Secondary | ICD-10-CM | POA: Diagnosis not present

## 2015-10-23 DIAGNOSIS — D479 Neoplasm of uncertain behavior of lymphoid, hematopoietic and related tissue, unspecified: Secondary | ICD-10-CM | POA: Diagnosis not present

## 2015-10-23 DIAGNOSIS — C9000 Multiple myeloma not having achieved remission: Secondary | ICD-10-CM | POA: Diagnosis not present

## 2015-10-24 DIAGNOSIS — Z9481 Bone marrow transplant status: Secondary | ICD-10-CM | POA: Diagnosis not present

## 2015-10-24 DIAGNOSIS — R768 Other specified abnormal immunological findings in serum: Secondary | ICD-10-CM | POA: Diagnosis not present

## 2015-10-24 DIAGNOSIS — K1231 Oral mucositis (ulcerative) due to antineoplastic therapy: Secondary | ICD-10-CM | POA: Diagnosis not present

## 2015-10-24 DIAGNOSIS — C9000 Multiple myeloma not having achieved remission: Secondary | ICD-10-CM | POA: Diagnosis not present

## 2015-10-24 DIAGNOSIS — D479 Neoplasm of uncertain behavior of lymphoid, hematopoietic and related tissue, unspecified: Secondary | ICD-10-CM | POA: Diagnosis not present

## 2015-10-24 DIAGNOSIS — R197 Diarrhea, unspecified: Secondary | ICD-10-CM | POA: Diagnosis not present

## 2015-10-25 DIAGNOSIS — R768 Other specified abnormal immunological findings in serum: Secondary | ICD-10-CM | POA: Diagnosis not present

## 2015-10-25 DIAGNOSIS — D479 Neoplasm of uncertain behavior of lymphoid, hematopoietic and related tissue, unspecified: Secondary | ICD-10-CM | POA: Diagnosis not present

## 2015-10-25 DIAGNOSIS — Z789 Other specified health status: Secondary | ICD-10-CM | POA: Diagnosis not present

## 2015-10-25 DIAGNOSIS — K1231 Oral mucositis (ulcerative) due to antineoplastic therapy: Secondary | ICD-10-CM | POA: Diagnosis not present

## 2015-10-25 DIAGNOSIS — Z9481 Bone marrow transplant status: Secondary | ICD-10-CM | POA: Diagnosis not present

## 2015-10-25 DIAGNOSIS — R197 Diarrhea, unspecified: Secondary | ICD-10-CM | POA: Diagnosis not present

## 2015-10-25 DIAGNOSIS — C9000 Multiple myeloma not having achieved remission: Secondary | ICD-10-CM | POA: Diagnosis not present

## 2015-10-26 DIAGNOSIS — C9000 Multiple myeloma not having achieved remission: Secondary | ICD-10-CM | POA: Diagnosis not present

## 2015-10-26 DIAGNOSIS — R197 Diarrhea, unspecified: Secondary | ICD-10-CM | POA: Diagnosis not present

## 2015-10-26 DIAGNOSIS — R768 Other specified abnormal immunological findings in serum: Secondary | ICD-10-CM | POA: Diagnosis not present

## 2015-10-26 DIAGNOSIS — D479 Neoplasm of uncertain behavior of lymphoid, hematopoietic and related tissue, unspecified: Secondary | ICD-10-CM | POA: Diagnosis not present

## 2015-10-26 DIAGNOSIS — K1231 Oral mucositis (ulcerative) due to antineoplastic therapy: Secondary | ICD-10-CM | POA: Diagnosis not present

## 2015-10-26 DIAGNOSIS — Z9481 Bone marrow transplant status: Secondary | ICD-10-CM | POA: Diagnosis not present

## 2015-10-27 DIAGNOSIS — Z96611 Presence of right artificial shoulder joint: Secondary | ICD-10-CM | POA: Diagnosis not present

## 2015-10-27 DIAGNOSIS — Z452 Encounter for adjustment and management of vascular access device: Secondary | ICD-10-CM | POA: Diagnosis not present

## 2015-10-27 DIAGNOSIS — R197 Diarrhea, unspecified: Secondary | ICD-10-CM | POA: Diagnosis not present

## 2015-10-27 DIAGNOSIS — D479 Neoplasm of uncertain behavior of lymphoid, hematopoietic and related tissue, unspecified: Secondary | ICD-10-CM | POA: Diagnosis not present

## 2015-10-27 DIAGNOSIS — K1231 Oral mucositis (ulcerative) due to antineoplastic therapy: Secondary | ICD-10-CM | POA: Diagnosis not present

## 2015-10-27 DIAGNOSIS — R768 Other specified abnormal immunological findings in serum: Secondary | ICD-10-CM | POA: Diagnosis not present

## 2015-10-27 DIAGNOSIS — Z9481 Bone marrow transplant status: Secondary | ICD-10-CM | POA: Diagnosis not present

## 2015-10-27 DIAGNOSIS — C9000 Multiple myeloma not having achieved remission: Secondary | ICD-10-CM | POA: Diagnosis not present

## 2015-10-27 DIAGNOSIS — G912 (Idiopathic) normal pressure hydrocephalus: Secondary | ICD-10-CM | POA: Diagnosis not present

## 2015-10-28 DIAGNOSIS — D479 Neoplasm of uncertain behavior of lymphoid, hematopoietic and related tissue, unspecified: Secondary | ICD-10-CM | POA: Diagnosis not present

## 2015-10-28 DIAGNOSIS — C9000 Multiple myeloma not having achieved remission: Secondary | ICD-10-CM | POA: Diagnosis not present

## 2015-10-28 DIAGNOSIS — R768 Other specified abnormal immunological findings in serum: Secondary | ICD-10-CM | POA: Diagnosis not present

## 2015-10-28 DIAGNOSIS — Z9481 Bone marrow transplant status: Secondary | ICD-10-CM | POA: Diagnosis not present

## 2015-10-28 DIAGNOSIS — K1231 Oral mucositis (ulcerative) due to antineoplastic therapy: Secondary | ICD-10-CM | POA: Diagnosis not present

## 2015-10-28 DIAGNOSIS — R197 Diarrhea, unspecified: Secondary | ICD-10-CM | POA: Diagnosis not present

## 2015-10-29 DIAGNOSIS — K1231 Oral mucositis (ulcerative) due to antineoplastic therapy: Secondary | ICD-10-CM | POA: Diagnosis not present

## 2015-10-29 DIAGNOSIS — C9000 Multiple myeloma not having achieved remission: Secondary | ICD-10-CM | POA: Diagnosis not present

## 2015-10-29 DIAGNOSIS — R197 Diarrhea, unspecified: Secondary | ICD-10-CM | POA: Diagnosis not present

## 2015-10-29 DIAGNOSIS — Z9481 Bone marrow transplant status: Secondary | ICD-10-CM | POA: Diagnosis not present

## 2015-10-29 DIAGNOSIS — R768 Other specified abnormal immunological findings in serum: Secondary | ICD-10-CM | POA: Diagnosis not present

## 2015-10-29 DIAGNOSIS — D479 Neoplasm of uncertain behavior of lymphoid, hematopoietic and related tissue, unspecified: Secondary | ICD-10-CM | POA: Diagnosis not present

## 2015-10-30 DIAGNOSIS — K1231 Oral mucositis (ulcerative) due to antineoplastic therapy: Secondary | ICD-10-CM | POA: Diagnosis not present

## 2015-10-30 DIAGNOSIS — C9000 Multiple myeloma not having achieved remission: Secondary | ICD-10-CM | POA: Diagnosis not present

## 2015-10-30 DIAGNOSIS — D61810 Antineoplastic chemotherapy induced pancytopenia: Secondary | ICD-10-CM | POA: Diagnosis not present

## 2015-10-30 DIAGNOSIS — T451X5A Adverse effect of antineoplastic and immunosuppressive drugs, initial encounter: Secondary | ICD-10-CM | POA: Diagnosis not present

## 2015-10-30 DIAGNOSIS — Z9481 Bone marrow transplant status: Secondary | ICD-10-CM | POA: Diagnosis not present

## 2015-10-30 DIAGNOSIS — K649 Unspecified hemorrhoids: Secondary | ICD-10-CM | POA: Diagnosis not present

## 2015-10-30 DIAGNOSIS — R197 Diarrhea, unspecified: Secondary | ICD-10-CM | POA: Diagnosis not present

## 2015-10-30 DIAGNOSIS — D479 Neoplasm of uncertain behavior of lymphoid, hematopoietic and related tissue, unspecified: Secondary | ICD-10-CM | POA: Diagnosis not present

## 2015-10-31 DIAGNOSIS — R197 Diarrhea, unspecified: Secondary | ICD-10-CM | POA: Diagnosis not present

## 2015-10-31 DIAGNOSIS — T451X5A Adverse effect of antineoplastic and immunosuppressive drugs, initial encounter: Secondary | ICD-10-CM | POA: Diagnosis not present

## 2015-10-31 DIAGNOSIS — D479 Neoplasm of uncertain behavior of lymphoid, hematopoietic and related tissue, unspecified: Secondary | ICD-10-CM | POA: Diagnosis not present

## 2015-10-31 DIAGNOSIS — C9000 Multiple myeloma not having achieved remission: Secondary | ICD-10-CM | POA: Diagnosis not present

## 2015-10-31 DIAGNOSIS — K1231 Oral mucositis (ulcerative) due to antineoplastic therapy: Secondary | ICD-10-CM | POA: Diagnosis not present

## 2015-10-31 DIAGNOSIS — K649 Unspecified hemorrhoids: Secondary | ICD-10-CM | POA: Diagnosis not present

## 2015-10-31 DIAGNOSIS — D61810 Antineoplastic chemotherapy induced pancytopenia: Secondary | ICD-10-CM | POA: Diagnosis not present

## 2015-10-31 DIAGNOSIS — Z9481 Bone marrow transplant status: Secondary | ICD-10-CM | POA: Diagnosis not present

## 2015-11-01 DIAGNOSIS — Z9481 Bone marrow transplant status: Secondary | ICD-10-CM | POA: Diagnosis not present

## 2015-11-01 DIAGNOSIS — C9000 Multiple myeloma not having achieved remission: Secondary | ICD-10-CM | POA: Diagnosis not present

## 2015-11-01 DIAGNOSIS — K1231 Oral mucositis (ulcerative) due to antineoplastic therapy: Secondary | ICD-10-CM | POA: Diagnosis not present

## 2015-11-01 DIAGNOSIS — Z5111 Encounter for antineoplastic chemotherapy: Secondary | ICD-10-CM | POA: Diagnosis not present

## 2015-11-01 DIAGNOSIS — Z789 Other specified health status: Secondary | ICD-10-CM | POA: Diagnosis not present

## 2015-11-01 DIAGNOSIS — R1312 Dysphagia, oropharyngeal phase: Secondary | ICD-10-CM | POA: Diagnosis not present

## 2015-11-01 DIAGNOSIS — Z452 Encounter for adjustment and management of vascular access device: Secondary | ICD-10-CM | POA: Diagnosis not present

## 2015-11-01 DIAGNOSIS — R197 Diarrhea, unspecified: Secondary | ICD-10-CM | POA: Diagnosis not present

## 2015-11-01 DIAGNOSIS — D479 Neoplasm of uncertain behavior of lymphoid, hematopoietic and related tissue, unspecified: Secondary | ICD-10-CM | POA: Diagnosis not present

## 2015-11-01 DIAGNOSIS — R768 Other specified abnormal immunological findings in serum: Secondary | ICD-10-CM | POA: Diagnosis not present

## 2015-11-02 DIAGNOSIS — Z9481 Bone marrow transplant status: Secondary | ICD-10-CM | POA: Diagnosis not present

## 2015-11-02 DIAGNOSIS — R197 Diarrhea, unspecified: Secondary | ICD-10-CM | POA: Diagnosis not present

## 2015-11-02 DIAGNOSIS — D479 Neoplasm of uncertain behavior of lymphoid, hematopoietic and related tissue, unspecified: Secondary | ICD-10-CM | POA: Diagnosis not present

## 2015-11-02 DIAGNOSIS — C9000 Multiple myeloma not having achieved remission: Secondary | ICD-10-CM | POA: Diagnosis not present

## 2015-11-02 DIAGNOSIS — K1231 Oral mucositis (ulcerative) due to antineoplastic therapy: Secondary | ICD-10-CM | POA: Diagnosis not present

## 2015-11-02 DIAGNOSIS — R768 Other specified abnormal immunological findings in serum: Secondary | ICD-10-CM | POA: Diagnosis not present

## 2015-11-04 DIAGNOSIS — R197 Diarrhea, unspecified: Secondary | ICD-10-CM | POA: Diagnosis not present

## 2015-11-04 DIAGNOSIS — Z9481 Bone marrow transplant status: Secondary | ICD-10-CM | POA: Diagnosis not present

## 2015-11-04 DIAGNOSIS — K1231 Oral mucositis (ulcerative) due to antineoplastic therapy: Secondary | ICD-10-CM | POA: Diagnosis not present

## 2015-11-04 DIAGNOSIS — R768 Other specified abnormal immunological findings in serum: Secondary | ICD-10-CM | POA: Diagnosis not present

## 2015-11-04 DIAGNOSIS — D479 Neoplasm of uncertain behavior of lymphoid, hematopoietic and related tissue, unspecified: Secondary | ICD-10-CM | POA: Diagnosis not present

## 2015-11-04 DIAGNOSIS — C9000 Multiple myeloma not having achieved remission: Secondary | ICD-10-CM | POA: Diagnosis not present

## 2015-11-04 DIAGNOSIS — D469 Myelodysplastic syndrome, unspecified: Secondary | ICD-10-CM | POA: Diagnosis not present

## 2015-11-05 DIAGNOSIS — T451X5A Adverse effect of antineoplastic and immunosuppressive drugs, initial encounter: Secondary | ICD-10-CM | POA: Diagnosis not present

## 2015-11-05 DIAGNOSIS — D801 Nonfamilial hypogammaglobulinemia: Secondary | ICD-10-CM | POA: Diagnosis not present

## 2015-11-05 DIAGNOSIS — Z9481 Bone marrow transplant status: Secondary | ICD-10-CM | POA: Diagnosis not present

## 2015-11-05 DIAGNOSIS — I10 Essential (primary) hypertension: Secondary | ICD-10-CM | POA: Diagnosis not present

## 2015-11-05 DIAGNOSIS — K649 Unspecified hemorrhoids: Secondary | ICD-10-CM | POA: Diagnosis not present

## 2015-11-05 DIAGNOSIS — Z9484 Stem cells transplant status: Secondary | ICD-10-CM | POA: Diagnosis not present

## 2015-11-05 DIAGNOSIS — D479 Neoplasm of uncertain behavior of lymphoid, hematopoietic and related tissue, unspecified: Secondary | ICD-10-CM | POA: Diagnosis not present

## 2015-11-05 DIAGNOSIS — C9000 Multiple myeloma not having achieved remission: Secondary | ICD-10-CM | POA: Diagnosis not present

## 2015-11-05 DIAGNOSIS — D61810 Antineoplastic chemotherapy induced pancytopenia: Secondary | ICD-10-CM | POA: Diagnosis not present

## 2015-11-05 DIAGNOSIS — B348 Other viral infections of unspecified site: Secondary | ICD-10-CM | POA: Diagnosis not present

## 2015-11-05 DIAGNOSIS — R197 Diarrhea, unspecified: Secondary | ICD-10-CM | POA: Diagnosis not present

## 2015-11-05 DIAGNOSIS — D469 Myelodysplastic syndrome, unspecified: Secondary | ICD-10-CM | POA: Diagnosis not present

## 2015-11-05 DIAGNOSIS — R509 Fever, unspecified: Secondary | ICD-10-CM | POA: Diagnosis not present

## 2015-11-06 DIAGNOSIS — C9000 Multiple myeloma not having achieved remission: Secondary | ICD-10-CM | POA: Diagnosis not present

## 2015-11-06 DIAGNOSIS — R509 Fever, unspecified: Secondary | ICD-10-CM | POA: Diagnosis not present

## 2015-11-06 DIAGNOSIS — D479 Neoplasm of uncertain behavior of lymphoid, hematopoietic and related tissue, unspecified: Secondary | ICD-10-CM | POA: Diagnosis not present

## 2015-11-07 DIAGNOSIS — C9000 Multiple myeloma not having achieved remission: Secondary | ICD-10-CM | POA: Diagnosis not present

## 2015-11-07 DIAGNOSIS — D479 Neoplasm of uncertain behavior of lymphoid, hematopoietic and related tissue, unspecified: Secondary | ICD-10-CM | POA: Diagnosis not present

## 2015-11-07 DIAGNOSIS — R509 Fever, unspecified: Secondary | ICD-10-CM | POA: Diagnosis not present

## 2015-11-08 DIAGNOSIS — R29898 Other symptoms and signs involving the musculoskeletal system: Secondary | ICD-10-CM | POA: Diagnosis not present

## 2015-11-08 DIAGNOSIS — R262 Difficulty in walking, not elsewhere classified: Secondary | ICD-10-CM | POA: Diagnosis not present

## 2015-11-08 DIAGNOSIS — D479 Neoplasm of uncertain behavior of lymphoid, hematopoietic and related tissue, unspecified: Secondary | ICD-10-CM | POA: Diagnosis not present

## 2015-11-08 DIAGNOSIS — R509 Fever, unspecified: Secondary | ICD-10-CM | POA: Diagnosis not present

## 2015-11-08 DIAGNOSIS — Z9481 Bone marrow transplant status: Secondary | ICD-10-CM | POA: Insufficient documentation

## 2015-11-08 DIAGNOSIS — C9000 Multiple myeloma not having achieved remission: Secondary | ICD-10-CM | POA: Diagnosis not present

## 2015-11-08 DIAGNOSIS — Z5181 Encounter for therapeutic drug level monitoring: Secondary | ICD-10-CM | POA: Insufficient documentation

## 2015-11-08 DIAGNOSIS — Z9484 Stem cells transplant status: Secondary | ICD-10-CM | POA: Diagnosis not present

## 2015-11-08 DIAGNOSIS — M6281 Muscle weakness (generalized): Secondary | ICD-10-CM | POA: Diagnosis not present

## 2015-11-09 DIAGNOSIS — Z5181 Encounter for therapeutic drug level monitoring: Secondary | ICD-10-CM | POA: Diagnosis not present

## 2015-11-09 DIAGNOSIS — D479 Neoplasm of uncertain behavior of lymphoid, hematopoietic and related tissue, unspecified: Secondary | ICD-10-CM | POA: Diagnosis not present

## 2015-11-09 DIAGNOSIS — Z9484 Stem cells transplant status: Secondary | ICD-10-CM | POA: Diagnosis not present

## 2015-11-09 DIAGNOSIS — Z9481 Bone marrow transplant status: Secondary | ICD-10-CM | POA: Diagnosis not present

## 2015-11-09 DIAGNOSIS — C9000 Multiple myeloma not having achieved remission: Secondary | ICD-10-CM | POA: Diagnosis not present

## 2015-11-09 DIAGNOSIS — J90 Pleural effusion, not elsewhere classified: Secondary | ICD-10-CM | POA: Diagnosis not present

## 2015-11-10 DIAGNOSIS — D61810 Antineoplastic chemotherapy induced pancytopenia: Secondary | ICD-10-CM | POA: Diagnosis not present

## 2015-11-10 DIAGNOSIS — D46Z Other myelodysplastic syndromes: Secondary | ICD-10-CM | POA: Diagnosis not present

## 2015-11-10 DIAGNOSIS — D801 Nonfamilial hypogammaglobulinemia: Secondary | ICD-10-CM | POA: Diagnosis not present

## 2015-11-10 DIAGNOSIS — I10 Essential (primary) hypertension: Secondary | ICD-10-CM | POA: Diagnosis not present

## 2015-11-10 DIAGNOSIS — C9000 Multiple myeloma not having achieved remission: Secondary | ICD-10-CM | POA: Diagnosis not present

## 2015-11-10 DIAGNOSIS — Z9484 Stem cells transplant status: Secondary | ICD-10-CM | POA: Diagnosis not present

## 2015-11-10 DIAGNOSIS — G4700 Insomnia, unspecified: Secondary | ICD-10-CM | POA: Diagnosis not present

## 2015-11-10 DIAGNOSIS — T451X5D Adverse effect of antineoplastic and immunosuppressive drugs, subsequent encounter: Secondary | ICD-10-CM | POA: Diagnosis not present

## 2015-11-10 DIAGNOSIS — Z5181 Encounter for therapeutic drug level monitoring: Secondary | ICD-10-CM | POA: Diagnosis not present

## 2015-11-10 DIAGNOSIS — R69 Illness, unspecified: Secondary | ICD-10-CM | POA: Diagnosis not present

## 2015-11-10 DIAGNOSIS — K649 Unspecified hemorrhoids: Secondary | ICD-10-CM | POA: Diagnosis not present

## 2015-11-10 DIAGNOSIS — Z9481 Bone marrow transplant status: Secondary | ICD-10-CM | POA: Diagnosis not present

## 2015-11-10 DIAGNOSIS — D479 Neoplasm of uncertain behavior of lymphoid, hematopoietic and related tissue, unspecified: Secondary | ICD-10-CM | POA: Diagnosis not present

## 2015-11-11 DIAGNOSIS — M6281 Muscle weakness (generalized): Secondary | ICD-10-CM | POA: Diagnosis not present

## 2015-11-11 DIAGNOSIS — Z9484 Stem cells transplant status: Secondary | ICD-10-CM | POA: Diagnosis not present

## 2015-11-11 DIAGNOSIS — I10 Essential (primary) hypertension: Secondary | ICD-10-CM | POA: Diagnosis not present

## 2015-11-11 DIAGNOSIS — Z48298 Encounter for aftercare following other organ transplant: Secondary | ICD-10-CM | POA: Diagnosis not present

## 2015-11-11 DIAGNOSIS — R262 Difficulty in walking, not elsewhere classified: Secondary | ICD-10-CM | POA: Diagnosis not present

## 2015-11-11 DIAGNOSIS — D801 Nonfamilial hypogammaglobulinemia: Secondary | ICD-10-CM | POA: Diagnosis not present

## 2015-11-11 DIAGNOSIS — D469 Myelodysplastic syndrome, unspecified: Secondary | ICD-10-CM | POA: Diagnosis not present

## 2015-11-11 DIAGNOSIS — D479 Neoplasm of uncertain behavior of lymphoid, hematopoietic and related tissue, unspecified: Secondary | ICD-10-CM | POA: Diagnosis not present

## 2015-11-11 DIAGNOSIS — Z9481 Bone marrow transplant status: Secondary | ICD-10-CM | POA: Diagnosis not present

## 2015-11-11 DIAGNOSIS — Z5181 Encounter for therapeutic drug level monitoring: Secondary | ICD-10-CM | POA: Diagnosis not present

## 2015-11-11 DIAGNOSIS — R29898 Other symptoms and signs involving the musculoskeletal system: Secondary | ICD-10-CM | POA: Diagnosis not present

## 2015-11-11 DIAGNOSIS — C9000 Multiple myeloma not having achieved remission: Secondary | ICD-10-CM | POA: Diagnosis not present

## 2015-11-12 DIAGNOSIS — D479 Neoplasm of uncertain behavior of lymphoid, hematopoietic and related tissue, unspecified: Secondary | ICD-10-CM | POA: Diagnosis not present

## 2015-11-12 DIAGNOSIS — Z9481 Bone marrow transplant status: Secondary | ICD-10-CM | POA: Diagnosis not present

## 2015-11-12 DIAGNOSIS — C9000 Multiple myeloma not having achieved remission: Secondary | ICD-10-CM | POA: Diagnosis not present

## 2015-11-12 DIAGNOSIS — Z5181 Encounter for therapeutic drug level monitoring: Secondary | ICD-10-CM | POA: Diagnosis not present

## 2015-11-14 DIAGNOSIS — R197 Diarrhea, unspecified: Secondary | ICD-10-CM | POA: Diagnosis not present

## 2015-11-14 DIAGNOSIS — C9000 Multiple myeloma not having achieved remission: Secondary | ICD-10-CM | POA: Diagnosis not present

## 2015-11-14 DIAGNOSIS — R5383 Other fatigue: Secondary | ICD-10-CM | POA: Diagnosis not present

## 2015-11-14 DIAGNOSIS — R110 Nausea: Secondary | ICD-10-CM | POA: Diagnosis not present

## 2015-11-14 DIAGNOSIS — R05 Cough: Secondary | ICD-10-CM | POA: Diagnosis not present

## 2015-11-14 DIAGNOSIS — Z9484 Stem cells transplant status: Secondary | ICD-10-CM | POA: Diagnosis not present

## 2015-11-14 DIAGNOSIS — D469 Myelodysplastic syndrome, unspecified: Secondary | ICD-10-CM | POA: Diagnosis not present

## 2015-11-14 DIAGNOSIS — Z79899 Other long term (current) drug therapy: Secondary | ICD-10-CM | POA: Diagnosis not present

## 2015-11-15 DIAGNOSIS — C9000 Multiple myeloma not having achieved remission: Secondary | ICD-10-CM | POA: Diagnosis not present

## 2015-11-15 DIAGNOSIS — Z79899 Other long term (current) drug therapy: Secondary | ICD-10-CM | POA: Diagnosis not present

## 2015-11-15 DIAGNOSIS — I10 Essential (primary) hypertension: Secondary | ICD-10-CM | POA: Diagnosis not present

## 2015-11-15 DIAGNOSIS — R918 Other nonspecific abnormal finding of lung field: Secondary | ICD-10-CM | POA: Diagnosis not present

## 2015-11-15 DIAGNOSIS — D61810 Antineoplastic chemotherapy induced pancytopenia: Secondary | ICD-10-CM | POA: Diagnosis not present

## 2015-11-15 DIAGNOSIS — Z9481 Bone marrow transplant status: Secondary | ICD-10-CM | POA: Diagnosis not present

## 2015-11-15 DIAGNOSIS — R5383 Other fatigue: Secondary | ICD-10-CM | POA: Diagnosis not present

## 2015-11-15 DIAGNOSIS — Z5181 Encounter for therapeutic drug level monitoring: Secondary | ICD-10-CM | POA: Diagnosis not present

## 2015-11-15 DIAGNOSIS — D801 Nonfamilial hypogammaglobulinemia: Secondary | ICD-10-CM | POA: Diagnosis not present

## 2015-11-15 DIAGNOSIS — R531 Weakness: Secondary | ICD-10-CM | POA: Diagnosis not present

## 2015-11-15 DIAGNOSIS — Z9484 Stem cells transplant status: Secondary | ICD-10-CM | POA: Diagnosis not present

## 2015-11-15 DIAGNOSIS — D479 Neoplasm of uncertain behavior of lymphoid, hematopoietic and related tissue, unspecified: Secondary | ICD-10-CM | POA: Diagnosis not present

## 2015-11-15 DIAGNOSIS — J90 Pleural effusion, not elsewhere classified: Secondary | ICD-10-CM | POA: Diagnosis not present

## 2015-11-15 DIAGNOSIS — R05 Cough: Secondary | ICD-10-CM | POA: Diagnosis not present

## 2015-11-15 DIAGNOSIS — D469 Myelodysplastic syndrome, unspecified: Secondary | ICD-10-CM | POA: Diagnosis not present

## 2015-11-16 DIAGNOSIS — Z9484 Stem cells transplant status: Secondary | ICD-10-CM | POA: Diagnosis not present

## 2015-11-16 DIAGNOSIS — Z9481 Bone marrow transplant status: Secondary | ICD-10-CM | POA: Diagnosis not present

## 2015-11-16 DIAGNOSIS — Z5181 Encounter for therapeutic drug level monitoring: Secondary | ICD-10-CM | POA: Diagnosis not present

## 2015-11-16 DIAGNOSIS — R5081 Fever presenting with conditions classified elsewhere: Secondary | ICD-10-CM | POA: Diagnosis not present

## 2015-11-16 DIAGNOSIS — D709 Neutropenia, unspecified: Secondary | ICD-10-CM | POA: Diagnosis not present

## 2015-11-16 DIAGNOSIS — C9000 Multiple myeloma not having achieved remission: Secondary | ICD-10-CM | POA: Diagnosis not present

## 2015-11-16 DIAGNOSIS — B1081 Human herpesvirus 6 infection: Secondary | ICD-10-CM | POA: Diagnosis not present

## 2015-11-16 DIAGNOSIS — D479 Neoplasm of uncertain behavior of lymphoid, hematopoietic and related tissue, unspecified: Secondary | ICD-10-CM | POA: Diagnosis not present

## 2015-11-17 DIAGNOSIS — Z5181 Encounter for therapeutic drug level monitoring: Secondary | ICD-10-CM | POA: Diagnosis not present

## 2015-11-17 DIAGNOSIS — Z9481 Bone marrow transplant status: Secondary | ICD-10-CM | POA: Diagnosis not present

## 2015-11-17 DIAGNOSIS — D479 Neoplasm of uncertain behavior of lymphoid, hematopoietic and related tissue, unspecified: Secondary | ICD-10-CM | POA: Diagnosis not present

## 2015-11-17 DIAGNOSIS — C9000 Multiple myeloma not having achieved remission: Secondary | ICD-10-CM | POA: Diagnosis not present

## 2015-11-18 DIAGNOSIS — Z9481 Bone marrow transplant status: Secondary | ICD-10-CM | POA: Diagnosis not present

## 2015-11-18 DIAGNOSIS — R69 Illness, unspecified: Secondary | ICD-10-CM | POA: Diagnosis not present

## 2015-11-18 DIAGNOSIS — G4700 Insomnia, unspecified: Secondary | ICD-10-CM | POA: Diagnosis not present

## 2015-11-18 DIAGNOSIS — M6281 Muscle weakness (generalized): Secondary | ICD-10-CM | POA: Diagnosis not present

## 2015-11-18 DIAGNOSIS — C9000 Multiple myeloma not having achieved remission: Secondary | ICD-10-CM | POA: Diagnosis not present

## 2015-11-18 DIAGNOSIS — R29898 Other symptoms and signs involving the musculoskeletal system: Secondary | ICD-10-CM | POA: Diagnosis not present

## 2015-11-18 DIAGNOSIS — D469 Myelodysplastic syndrome, unspecified: Secondary | ICD-10-CM | POA: Diagnosis not present

## 2015-11-18 DIAGNOSIS — Z5181 Encounter for therapeutic drug level monitoring: Secondary | ICD-10-CM | POA: Diagnosis not present

## 2015-11-18 DIAGNOSIS — R5383 Other fatigue: Secondary | ICD-10-CM | POA: Diagnosis not present

## 2015-11-18 DIAGNOSIS — B1081 Human herpesvirus 6 infection: Secondary | ICD-10-CM | POA: Diagnosis not present

## 2015-11-18 DIAGNOSIS — D479 Neoplasm of uncertain behavior of lymphoid, hematopoietic and related tissue, unspecified: Secondary | ICD-10-CM | POA: Diagnosis not present

## 2015-11-18 DIAGNOSIS — Z9484 Stem cells transplant status: Secondary | ICD-10-CM | POA: Diagnosis not present

## 2015-11-18 DIAGNOSIS — R262 Difficulty in walking, not elsewhere classified: Secondary | ICD-10-CM | POA: Diagnosis not present

## 2015-11-19 DIAGNOSIS — Z5181 Encounter for therapeutic drug level monitoring: Secondary | ICD-10-CM | POA: Diagnosis not present

## 2015-11-19 DIAGNOSIS — Z882 Allergy status to sulfonamides status: Secondary | ICD-10-CM | POA: Diagnosis not present

## 2015-11-19 DIAGNOSIS — D801 Nonfamilial hypogammaglobulinemia: Secondary | ICD-10-CM | POA: Diagnosis not present

## 2015-11-19 DIAGNOSIS — Z79899 Other long term (current) drug therapy: Secondary | ICD-10-CM | POA: Diagnosis not present

## 2015-11-19 DIAGNOSIS — D479 Neoplasm of uncertain behavior of lymphoid, hematopoietic and related tissue, unspecified: Secondary | ICD-10-CM | POA: Diagnosis not present

## 2015-11-19 DIAGNOSIS — B1081 Human herpesvirus 6 infection: Secondary | ICD-10-CM | POA: Diagnosis not present

## 2015-11-19 DIAGNOSIS — Z9481 Bone marrow transplant status: Secondary | ICD-10-CM | POA: Diagnosis not present

## 2015-11-19 DIAGNOSIS — J90 Pleural effusion, not elsewhere classified: Secondary | ICD-10-CM | POA: Diagnosis not present

## 2015-11-19 DIAGNOSIS — C9000 Multiple myeloma not having achieved remission: Secondary | ICD-10-CM | POA: Diagnosis not present

## 2015-11-19 DIAGNOSIS — D469 Myelodysplastic syndrome, unspecified: Secondary | ICD-10-CM | POA: Diagnosis not present

## 2015-11-19 DIAGNOSIS — Z9484 Stem cells transplant status: Secondary | ICD-10-CM | POA: Diagnosis not present

## 2015-11-19 DIAGNOSIS — Z48298 Encounter for aftercare following other organ transplant: Secondary | ICD-10-CM | POA: Diagnosis not present

## 2015-11-19 DIAGNOSIS — I10 Essential (primary) hypertension: Secondary | ICD-10-CM | POA: Diagnosis not present

## 2015-11-20 DIAGNOSIS — C9000 Multiple myeloma not having achieved remission: Secondary | ICD-10-CM | POA: Diagnosis not present

## 2015-11-20 DIAGNOSIS — Z9481 Bone marrow transplant status: Secondary | ICD-10-CM | POA: Diagnosis not present

## 2015-11-20 DIAGNOSIS — D479 Neoplasm of uncertain behavior of lymphoid, hematopoietic and related tissue, unspecified: Secondary | ICD-10-CM | POA: Diagnosis not present

## 2015-11-20 DIAGNOSIS — Z5181 Encounter for therapeutic drug level monitoring: Secondary | ICD-10-CM | POA: Diagnosis not present

## 2015-11-20 DIAGNOSIS — B1081 Human herpesvirus 6 infection: Secondary | ICD-10-CM | POA: Diagnosis not present

## 2015-11-21 DIAGNOSIS — D479 Neoplasm of uncertain behavior of lymphoid, hematopoietic and related tissue, unspecified: Secondary | ICD-10-CM | POA: Diagnosis not present

## 2015-11-21 DIAGNOSIS — M810 Age-related osteoporosis without current pathological fracture: Secondary | ICD-10-CM | POA: Diagnosis not present

## 2015-11-21 DIAGNOSIS — I10 Essential (primary) hypertension: Secondary | ICD-10-CM | POA: Diagnosis not present

## 2015-11-21 DIAGNOSIS — Z79891 Long term (current) use of opiate analgesic: Secondary | ICD-10-CM | POA: Diagnosis not present

## 2015-11-21 DIAGNOSIS — M19011 Primary osteoarthritis, right shoulder: Secondary | ICD-10-CM | POA: Diagnosis not present

## 2015-11-21 DIAGNOSIS — R768 Other specified abnormal immunological findings in serum: Secondary | ICD-10-CM | POA: Diagnosis not present

## 2015-11-21 DIAGNOSIS — Z882 Allergy status to sulfonamides status: Secondary | ICD-10-CM | POA: Diagnosis not present

## 2015-11-21 DIAGNOSIS — M19012 Primary osteoarthritis, left shoulder: Secondary | ICD-10-CM | POA: Diagnosis not present

## 2015-11-21 DIAGNOSIS — J90 Pleural effusion, not elsewhere classified: Secondary | ICD-10-CM | POA: Diagnosis not present

## 2015-11-21 DIAGNOSIS — B1081 Human herpesvirus 6 infection: Secondary | ICD-10-CM | POA: Diagnosis not present

## 2015-11-21 DIAGNOSIS — C9000 Multiple myeloma not having achieved remission: Secondary | ICD-10-CM | POA: Diagnosis not present

## 2015-11-21 DIAGNOSIS — R918 Other nonspecific abnormal finding of lung field: Secondary | ICD-10-CM | POA: Diagnosis not present

## 2015-11-21 DIAGNOSIS — Z9481 Bone marrow transplant status: Secondary | ICD-10-CM | POA: Diagnosis not present

## 2015-11-21 DIAGNOSIS — Z5181 Encounter for therapeutic drug level monitoring: Secondary | ICD-10-CM | POA: Diagnosis not present

## 2015-11-21 DIAGNOSIS — R5081 Fever presenting with conditions classified elsewhere: Secondary | ICD-10-CM | POA: Diagnosis not present

## 2015-11-21 DIAGNOSIS — D469 Myelodysplastic syndrome, unspecified: Secondary | ICD-10-CM | POA: Diagnosis not present

## 2015-11-21 DIAGNOSIS — D709 Neutropenia, unspecified: Secondary | ICD-10-CM | POA: Diagnosis not present

## 2015-11-22 DIAGNOSIS — D479 Neoplasm of uncertain behavior of lymphoid, hematopoietic and related tissue, unspecified: Secondary | ICD-10-CM | POA: Diagnosis not present

## 2015-11-22 DIAGNOSIS — Z9481 Bone marrow transplant status: Secondary | ICD-10-CM | POA: Diagnosis not present

## 2015-11-22 DIAGNOSIS — R5081 Fever presenting with conditions classified elsewhere: Secondary | ICD-10-CM | POA: Diagnosis not present

## 2015-11-22 DIAGNOSIS — Z5181 Encounter for therapeutic drug level monitoring: Secondary | ICD-10-CM | POA: Diagnosis not present

## 2015-11-22 DIAGNOSIS — B1081 Human herpesvirus 6 infection: Secondary | ICD-10-CM | POA: Diagnosis not present

## 2015-11-22 DIAGNOSIS — D709 Neutropenia, unspecified: Secondary | ICD-10-CM | POA: Diagnosis not present

## 2015-11-22 DIAGNOSIS — C9000 Multiple myeloma not having achieved remission: Secondary | ICD-10-CM | POA: Diagnosis not present

## 2015-11-23 DIAGNOSIS — Z5181 Encounter for therapeutic drug level monitoring: Secondary | ICD-10-CM | POA: Diagnosis not present

## 2015-11-23 DIAGNOSIS — Z9481 Bone marrow transplant status: Secondary | ICD-10-CM | POA: Diagnosis not present

## 2015-11-23 DIAGNOSIS — D479 Neoplasm of uncertain behavior of lymphoid, hematopoietic and related tissue, unspecified: Secondary | ICD-10-CM | POA: Diagnosis not present

## 2015-11-23 DIAGNOSIS — C9000 Multiple myeloma not having achieved remission: Secondary | ICD-10-CM | POA: Diagnosis not present

## 2015-11-23 DIAGNOSIS — D709 Neutropenia, unspecified: Secondary | ICD-10-CM | POA: Diagnosis not present

## 2015-11-23 DIAGNOSIS — R5081 Fever presenting with conditions classified elsewhere: Secondary | ICD-10-CM | POA: Diagnosis not present

## 2015-11-23 DIAGNOSIS — B1081 Human herpesvirus 6 infection: Secondary | ICD-10-CM | POA: Diagnosis not present

## 2015-11-24 DIAGNOSIS — I313 Pericardial effusion (noninflammatory): Secondary | ICD-10-CM | POA: Diagnosis not present

## 2015-11-24 DIAGNOSIS — B1081 Human herpesvirus 6 infection: Secondary | ICD-10-CM | POA: Diagnosis not present

## 2015-11-24 DIAGNOSIS — R5081 Fever presenting with conditions classified elsewhere: Secondary | ICD-10-CM | POA: Diagnosis not present

## 2015-11-24 DIAGNOSIS — J90 Pleural effusion, not elsewhere classified: Secondary | ICD-10-CM | POA: Diagnosis not present

## 2015-11-24 DIAGNOSIS — R918 Other nonspecific abnormal finding of lung field: Secondary | ICD-10-CM | POA: Diagnosis not present

## 2015-11-24 DIAGNOSIS — C9000 Multiple myeloma not having achieved remission: Secondary | ICD-10-CM | POA: Diagnosis not present

## 2015-11-24 DIAGNOSIS — Z9481 Bone marrow transplant status: Secondary | ICD-10-CM | POA: Diagnosis not present

## 2015-11-24 DIAGNOSIS — D479 Neoplasm of uncertain behavior of lymphoid, hematopoietic and related tissue, unspecified: Secondary | ICD-10-CM | POA: Diagnosis not present

## 2015-11-24 DIAGNOSIS — D709 Neutropenia, unspecified: Secondary | ICD-10-CM | POA: Diagnosis not present

## 2015-11-24 DIAGNOSIS — Z5181 Encounter for therapeutic drug level monitoring: Secondary | ICD-10-CM | POA: Diagnosis not present

## 2015-11-25 DIAGNOSIS — B1081 Human herpesvirus 6 infection: Secondary | ICD-10-CM | POA: Diagnosis not present

## 2015-11-25 DIAGNOSIS — M6281 Muscle weakness (generalized): Secondary | ICD-10-CM | POA: Diagnosis not present

## 2015-11-25 DIAGNOSIS — I313 Pericardial effusion (noninflammatory): Secondary | ICD-10-CM | POA: Diagnosis not present

## 2015-11-25 DIAGNOSIS — Z9481 Bone marrow transplant status: Secondary | ICD-10-CM | POA: Diagnosis not present

## 2015-11-25 DIAGNOSIS — D709 Neutropenia, unspecified: Secondary | ICD-10-CM | POA: Diagnosis not present

## 2015-11-25 DIAGNOSIS — C9000 Multiple myeloma not having achieved remission: Secondary | ICD-10-CM | POA: Diagnosis not present

## 2015-11-25 DIAGNOSIS — R29898 Other symptoms and signs involving the musculoskeletal system: Secondary | ICD-10-CM | POA: Diagnosis not present

## 2015-11-25 DIAGNOSIS — Z5181 Encounter for therapeutic drug level monitoring: Secondary | ICD-10-CM | POA: Diagnosis not present

## 2015-11-25 DIAGNOSIS — D479 Neoplasm of uncertain behavior of lymphoid, hematopoietic and related tissue, unspecified: Secondary | ICD-10-CM | POA: Diagnosis not present

## 2015-11-25 DIAGNOSIS — R5081 Fever presenting with conditions classified elsewhere: Secondary | ICD-10-CM | POA: Diagnosis not present

## 2015-11-26 DIAGNOSIS — B1081 Human herpesvirus 6 infection: Secondary | ICD-10-CM | POA: Diagnosis not present

## 2015-11-26 DIAGNOSIS — D479 Neoplasm of uncertain behavior of lymphoid, hematopoietic and related tissue, unspecified: Secondary | ICD-10-CM | POA: Diagnosis not present

## 2015-11-26 DIAGNOSIS — I313 Pericardial effusion (noninflammatory): Secondary | ICD-10-CM | POA: Diagnosis not present

## 2015-11-26 DIAGNOSIS — Z79899 Other long term (current) drug therapy: Secondary | ICD-10-CM | POA: Diagnosis not present

## 2015-11-26 DIAGNOSIS — D709 Neutropenia, unspecified: Secondary | ICD-10-CM | POA: Diagnosis not present

## 2015-11-26 DIAGNOSIS — Z9481 Bone marrow transplant status: Secondary | ICD-10-CM | POA: Diagnosis not present

## 2015-11-26 DIAGNOSIS — C9000 Multiple myeloma not having achieved remission: Secondary | ICD-10-CM | POA: Diagnosis not present

## 2015-11-26 DIAGNOSIS — Z5181 Encounter for therapeutic drug level monitoring: Secondary | ICD-10-CM | POA: Diagnosis not present

## 2015-11-26 DIAGNOSIS — B259 Cytomegaloviral disease, unspecified: Secondary | ICD-10-CM | POA: Diagnosis not present

## 2015-11-26 DIAGNOSIS — R5081 Fever presenting with conditions classified elsewhere: Secondary | ICD-10-CM | POA: Diagnosis not present

## 2015-11-27 DIAGNOSIS — B1081 Human herpesvirus 6 infection: Secondary | ICD-10-CM | POA: Diagnosis not present

## 2015-11-27 DIAGNOSIS — R5081 Fever presenting with conditions classified elsewhere: Secondary | ICD-10-CM | POA: Diagnosis not present

## 2015-11-27 DIAGNOSIS — Z5181 Encounter for therapeutic drug level monitoring: Secondary | ICD-10-CM | POA: Diagnosis not present

## 2015-11-27 DIAGNOSIS — C9000 Multiple myeloma not having achieved remission: Secondary | ICD-10-CM | POA: Diagnosis not present

## 2015-11-27 DIAGNOSIS — Z9481 Bone marrow transplant status: Secondary | ICD-10-CM | POA: Diagnosis not present

## 2015-11-27 DIAGNOSIS — D709 Neutropenia, unspecified: Secondary | ICD-10-CM | POA: Diagnosis not present

## 2015-11-27 DIAGNOSIS — D479 Neoplasm of uncertain behavior of lymphoid, hematopoietic and related tissue, unspecified: Secondary | ICD-10-CM | POA: Diagnosis not present

## 2015-11-27 DIAGNOSIS — R768 Other specified abnormal immunological findings in serum: Secondary | ICD-10-CM | POA: Diagnosis not present

## 2015-11-29 DIAGNOSIS — R05 Cough: Secondary | ICD-10-CM | POA: Diagnosis not present

## 2015-11-29 DIAGNOSIS — G4700 Insomnia, unspecified: Secondary | ICD-10-CM | POA: Diagnosis not present

## 2015-11-29 DIAGNOSIS — Z9484 Stem cells transplant status: Secondary | ICD-10-CM | POA: Diagnosis not present

## 2015-11-29 DIAGNOSIS — R197 Diarrhea, unspecified: Secondary | ICD-10-CM | POA: Diagnosis not present

## 2015-11-29 DIAGNOSIS — D469 Myelodysplastic syndrome, unspecified: Secondary | ICD-10-CM | POA: Diagnosis not present

## 2015-11-29 DIAGNOSIS — D479 Neoplasm of uncertain behavior of lymphoid, hematopoietic and related tissue, unspecified: Secondary | ICD-10-CM | POA: Diagnosis not present

## 2015-11-29 DIAGNOSIS — B1081 Human herpesvirus 6 infection: Secondary | ICD-10-CM | POA: Diagnosis not present

## 2015-11-29 DIAGNOSIS — C9000 Multiple myeloma not having achieved remission: Secondary | ICD-10-CM | POA: Diagnosis not present

## 2015-11-29 DIAGNOSIS — I10 Essential (primary) hypertension: Secondary | ICD-10-CM | POA: Diagnosis not present

## 2015-11-29 DIAGNOSIS — M6281 Muscle weakness (generalized): Secondary | ICD-10-CM | POA: Diagnosis not present

## 2015-11-29 DIAGNOSIS — R29898 Other symptoms and signs involving the musculoskeletal system: Secondary | ICD-10-CM | POA: Diagnosis not present

## 2015-11-29 DIAGNOSIS — D709 Neutropenia, unspecified: Secondary | ICD-10-CM | POA: Diagnosis not present

## 2015-11-29 DIAGNOSIS — Z5181 Encounter for therapeutic drug level monitoring: Secondary | ICD-10-CM | POA: Diagnosis not present

## 2015-11-29 DIAGNOSIS — Z9481 Bone marrow transplant status: Secondary | ICD-10-CM | POA: Diagnosis not present

## 2015-11-29 DIAGNOSIS — Z8579 Personal history of other malignant neoplasms of lymphoid, hematopoietic and related tissues: Secondary | ICD-10-CM | POA: Diagnosis not present

## 2015-11-29 DIAGNOSIS — R5081 Fever presenting with conditions classified elsewhere: Secondary | ICD-10-CM | POA: Diagnosis not present

## 2015-11-29 DIAGNOSIS — B348 Other viral infections of unspecified site: Secondary | ICD-10-CM | POA: Diagnosis not present

## 2015-11-29 DIAGNOSIS — R262 Difficulty in walking, not elsewhere classified: Secondary | ICD-10-CM | POA: Diagnosis not present

## 2015-11-30 DIAGNOSIS — Z9481 Bone marrow transplant status: Secondary | ICD-10-CM | POA: Diagnosis not present

## 2015-11-30 DIAGNOSIS — B1081 Human herpesvirus 6 infection: Secondary | ICD-10-CM | POA: Diagnosis not present

## 2015-11-30 DIAGNOSIS — C9000 Multiple myeloma not having achieved remission: Secondary | ICD-10-CM | POA: Diagnosis not present

## 2015-11-30 DIAGNOSIS — D479 Neoplasm of uncertain behavior of lymphoid, hematopoietic and related tissue, unspecified: Secondary | ICD-10-CM | POA: Diagnosis not present

## 2015-11-30 DIAGNOSIS — Z5181 Encounter for therapeutic drug level monitoring: Secondary | ICD-10-CM | POA: Diagnosis not present

## 2015-12-01 DIAGNOSIS — C9000 Multiple myeloma not having achieved remission: Secondary | ICD-10-CM | POA: Diagnosis not present

## 2015-12-01 DIAGNOSIS — B348 Other viral infections of unspecified site: Secondary | ICD-10-CM | POA: Diagnosis not present

## 2015-12-01 DIAGNOSIS — Z9481 Bone marrow transplant status: Secondary | ICD-10-CM | POA: Diagnosis not present

## 2015-12-02 DIAGNOSIS — C9000 Multiple myeloma not having achieved remission: Secondary | ICD-10-CM | POA: Diagnosis not present

## 2015-12-02 DIAGNOSIS — R197 Diarrhea, unspecified: Secondary | ICD-10-CM | POA: Diagnosis not present

## 2015-12-02 DIAGNOSIS — R29898 Other symptoms and signs involving the musculoskeletal system: Secondary | ICD-10-CM | POA: Diagnosis not present

## 2015-12-02 DIAGNOSIS — B1081 Human herpesvirus 6 infection: Secondary | ICD-10-CM | POA: Diagnosis not present

## 2015-12-02 DIAGNOSIS — R5383 Other fatigue: Secondary | ICD-10-CM | POA: Diagnosis not present

## 2015-12-02 DIAGNOSIS — Z5181 Encounter for therapeutic drug level monitoring: Secondary | ICD-10-CM | POA: Diagnosis not present

## 2015-12-02 DIAGNOSIS — Z8579 Personal history of other malignant neoplasms of lymphoid, hematopoietic and related tissues: Secondary | ICD-10-CM | POA: Diagnosis not present

## 2015-12-02 DIAGNOSIS — R262 Difficulty in walking, not elsewhere classified: Secondary | ICD-10-CM | POA: Diagnosis not present

## 2015-12-02 DIAGNOSIS — M6281 Muscle weakness (generalized): Secondary | ICD-10-CM | POA: Diagnosis not present

## 2015-12-02 DIAGNOSIS — D479 Neoplasm of uncertain behavior of lymphoid, hematopoietic and related tissue, unspecified: Secondary | ICD-10-CM | POA: Diagnosis not present

## 2015-12-02 DIAGNOSIS — D469 Myelodysplastic syndrome, unspecified: Secondary | ICD-10-CM | POA: Diagnosis not present

## 2015-12-02 DIAGNOSIS — R69 Illness, unspecified: Secondary | ICD-10-CM | POA: Diagnosis not present

## 2015-12-02 DIAGNOSIS — I10 Essential (primary) hypertension: Secondary | ICD-10-CM | POA: Diagnosis not present

## 2015-12-02 DIAGNOSIS — Z9484 Stem cells transplant status: Secondary | ICD-10-CM | POA: Diagnosis not present

## 2015-12-02 DIAGNOSIS — Z9481 Bone marrow transplant status: Secondary | ICD-10-CM | POA: Diagnosis not present

## 2015-12-03 DIAGNOSIS — Z882 Allergy status to sulfonamides status: Secondary | ICD-10-CM | POA: Diagnosis not present

## 2015-12-03 DIAGNOSIS — Z0183 Encounter for blood typing: Secondary | ICD-10-CM | POA: Diagnosis not present

## 2015-12-03 DIAGNOSIS — C9000 Multiple myeloma not having achieved remission: Secondary | ICD-10-CM | POA: Diagnosis not present

## 2015-12-03 DIAGNOSIS — R69 Illness, unspecified: Secondary | ICD-10-CM | POA: Diagnosis not present

## 2015-12-03 DIAGNOSIS — G4700 Insomnia, unspecified: Secondary | ICD-10-CM | POA: Diagnosis not present

## 2015-12-03 DIAGNOSIS — B1081 Human herpesvirus 6 infection: Secondary | ICD-10-CM | POA: Diagnosis not present

## 2015-12-03 DIAGNOSIS — Z9484 Stem cells transplant status: Secondary | ICD-10-CM | POA: Diagnosis not present

## 2015-12-03 DIAGNOSIS — Z9481 Bone marrow transplant status: Secondary | ICD-10-CM | POA: Diagnosis not present

## 2015-12-03 DIAGNOSIS — I10 Essential (primary) hypertension: Secondary | ICD-10-CM | POA: Diagnosis not present

## 2015-12-03 DIAGNOSIS — Z5181 Encounter for therapeutic drug level monitoring: Secondary | ICD-10-CM | POA: Diagnosis not present

## 2015-12-03 DIAGNOSIS — D469 Myelodysplastic syndrome, unspecified: Secondary | ICD-10-CM | POA: Diagnosis not present

## 2015-12-03 DIAGNOSIS — D479 Neoplasm of uncertain behavior of lymphoid, hematopoietic and related tissue, unspecified: Secondary | ICD-10-CM | POA: Diagnosis not present

## 2015-12-03 DIAGNOSIS — Z79899 Other long term (current) drug therapy: Secondary | ICD-10-CM | POA: Diagnosis not present

## 2015-12-04 DIAGNOSIS — Z9481 Bone marrow transplant status: Secondary | ICD-10-CM | POA: Diagnosis not present

## 2015-12-04 DIAGNOSIS — C9000 Multiple myeloma not having achieved remission: Secondary | ICD-10-CM | POA: Diagnosis not present

## 2015-12-04 DIAGNOSIS — D479 Neoplasm of uncertain behavior of lymphoid, hematopoietic and related tissue, unspecified: Secondary | ICD-10-CM | POA: Diagnosis not present

## 2015-12-04 DIAGNOSIS — B259 Cytomegaloviral disease, unspecified: Secondary | ICD-10-CM | POA: Diagnosis not present

## 2015-12-04 DIAGNOSIS — Z5181 Encounter for therapeutic drug level monitoring: Secondary | ICD-10-CM | POA: Diagnosis not present

## 2015-12-05 DIAGNOSIS — Z9481 Bone marrow transplant status: Secondary | ICD-10-CM | POA: Diagnosis not present

## 2015-12-05 DIAGNOSIS — C9000 Multiple myeloma not having achieved remission: Secondary | ICD-10-CM | POA: Diagnosis not present

## 2015-12-05 DIAGNOSIS — B259 Cytomegaloviral disease, unspecified: Secondary | ICD-10-CM | POA: Diagnosis not present

## 2015-12-05 DIAGNOSIS — D479 Neoplasm of uncertain behavior of lymphoid, hematopoietic and related tissue, unspecified: Secondary | ICD-10-CM | POA: Diagnosis not present

## 2015-12-05 DIAGNOSIS — Z5181 Encounter for therapeutic drug level monitoring: Secondary | ICD-10-CM | POA: Diagnosis not present

## 2015-12-06 DIAGNOSIS — Z96611 Presence of right artificial shoulder joint: Secondary | ICD-10-CM | POA: Diagnosis not present

## 2015-12-06 DIAGNOSIS — Z5181 Encounter for therapeutic drug level monitoring: Secondary | ICD-10-CM | POA: Diagnosis not present

## 2015-12-06 DIAGNOSIS — R768 Other specified abnormal immunological findings in serum: Secondary | ICD-10-CM | POA: Diagnosis not present

## 2015-12-06 DIAGNOSIS — I10 Essential (primary) hypertension: Secondary | ICD-10-CM | POA: Diagnosis not present

## 2015-12-06 DIAGNOSIS — Z48298 Encounter for aftercare following other organ transplant: Secondary | ICD-10-CM | POA: Diagnosis not present

## 2015-12-06 DIAGNOSIS — J90 Pleural effusion, not elsewhere classified: Secondary | ICD-10-CM | POA: Diagnosis not present

## 2015-12-06 DIAGNOSIS — Z79899 Other long term (current) drug therapy: Secondary | ICD-10-CM | POA: Diagnosis not present

## 2015-12-06 DIAGNOSIS — C9000 Multiple myeloma not having achieved remission: Secondary | ICD-10-CM | POA: Diagnosis not present

## 2015-12-06 DIAGNOSIS — G4700 Insomnia, unspecified: Secondary | ICD-10-CM | POA: Diagnosis not present

## 2015-12-06 DIAGNOSIS — R69 Illness, unspecified: Secondary | ICD-10-CM | POA: Diagnosis not present

## 2015-12-06 DIAGNOSIS — Z9484 Stem cells transplant status: Secondary | ICD-10-CM | POA: Diagnosis not present

## 2015-12-06 DIAGNOSIS — Z9481 Bone marrow transplant status: Secondary | ICD-10-CM | POA: Diagnosis not present

## 2015-12-06 DIAGNOSIS — D469 Myelodysplastic syndrome, unspecified: Secondary | ICD-10-CM | POA: Diagnosis not present

## 2015-12-06 DIAGNOSIS — D479 Neoplasm of uncertain behavior of lymphoid, hematopoietic and related tissue, unspecified: Secondary | ICD-10-CM | POA: Diagnosis not present

## 2015-12-06 DIAGNOSIS — B259 Cytomegaloviral disease, unspecified: Secondary | ICD-10-CM | POA: Diagnosis not present

## 2015-12-07 DIAGNOSIS — B259 Cytomegaloviral disease, unspecified: Secondary | ICD-10-CM | POA: Diagnosis not present

## 2015-12-07 DIAGNOSIS — Z5181 Encounter for therapeutic drug level monitoring: Secondary | ICD-10-CM | POA: Diagnosis not present

## 2015-12-07 DIAGNOSIS — C9000 Multiple myeloma not having achieved remission: Secondary | ICD-10-CM | POA: Diagnosis not present

## 2015-12-07 DIAGNOSIS — D469 Myelodysplastic syndrome, unspecified: Secondary | ICD-10-CM | POA: Diagnosis not present

## 2015-12-07 DIAGNOSIS — D479 Neoplasm of uncertain behavior of lymphoid, hematopoietic and related tissue, unspecified: Secondary | ICD-10-CM | POA: Diagnosis not present

## 2015-12-07 DIAGNOSIS — Z9484 Stem cells transplant status: Secondary | ICD-10-CM | POA: Diagnosis not present

## 2015-12-07 DIAGNOSIS — Z9481 Bone marrow transplant status: Secondary | ICD-10-CM | POA: Diagnosis not present

## 2015-12-07 DIAGNOSIS — R938 Abnormal findings on diagnostic imaging of other specified body structures: Secondary | ICD-10-CM | POA: Diagnosis not present

## 2015-12-08 DIAGNOSIS — Z9484 Stem cells transplant status: Secondary | ICD-10-CM | POA: Diagnosis not present

## 2015-12-08 DIAGNOSIS — R768 Other specified abnormal immunological findings in serum: Secondary | ICD-10-CM | POA: Diagnosis not present

## 2015-12-08 DIAGNOSIS — G4700 Insomnia, unspecified: Secondary | ICD-10-CM | POA: Diagnosis not present

## 2015-12-08 DIAGNOSIS — Z5181 Encounter for therapeutic drug level monitoring: Secondary | ICD-10-CM | POA: Diagnosis not present

## 2015-12-08 DIAGNOSIS — B259 Cytomegaloviral disease, unspecified: Secondary | ICD-10-CM | POA: Diagnosis not present

## 2015-12-08 DIAGNOSIS — Z79899 Other long term (current) drug therapy: Secondary | ICD-10-CM | POA: Diagnosis not present

## 2015-12-08 DIAGNOSIS — D469 Myelodysplastic syndrome, unspecified: Secondary | ICD-10-CM | POA: Diagnosis not present

## 2015-12-08 DIAGNOSIS — C9000 Multiple myeloma not having achieved remission: Secondary | ICD-10-CM | POA: Diagnosis not present

## 2015-12-08 DIAGNOSIS — D479 Neoplasm of uncertain behavior of lymphoid, hematopoietic and related tissue, unspecified: Secondary | ICD-10-CM | POA: Diagnosis not present

## 2015-12-08 DIAGNOSIS — Z48298 Encounter for aftercare following other organ transplant: Secondary | ICD-10-CM | POA: Diagnosis not present

## 2015-12-08 DIAGNOSIS — Z9481 Bone marrow transplant status: Secondary | ICD-10-CM | POA: Diagnosis not present

## 2015-12-09 DIAGNOSIS — Z5181 Encounter for therapeutic drug level monitoring: Secondary | ICD-10-CM | POA: Diagnosis not present

## 2015-12-09 DIAGNOSIS — M6281 Muscle weakness (generalized): Secondary | ICD-10-CM | POA: Diagnosis not present

## 2015-12-09 DIAGNOSIS — R262 Difficulty in walking, not elsewhere classified: Secondary | ICD-10-CM | POA: Diagnosis not present

## 2015-12-09 DIAGNOSIS — B259 Cytomegaloviral disease, unspecified: Secondary | ICD-10-CM | POA: Diagnosis not present

## 2015-12-09 DIAGNOSIS — Z48298 Encounter for aftercare following other organ transplant: Secondary | ICD-10-CM | POA: Diagnosis not present

## 2015-12-09 DIAGNOSIS — Z79899 Other long term (current) drug therapy: Secondary | ICD-10-CM | POA: Diagnosis not present

## 2015-12-09 DIAGNOSIS — R29898 Other symptoms and signs involving the musculoskeletal system: Secondary | ICD-10-CM | POA: Diagnosis not present

## 2015-12-09 DIAGNOSIS — Z9484 Stem cells transplant status: Secondary | ICD-10-CM | POA: Diagnosis not present

## 2015-12-09 DIAGNOSIS — C9000 Multiple myeloma not having achieved remission: Secondary | ICD-10-CM | POA: Diagnosis not present

## 2015-12-10 DIAGNOSIS — B259 Cytomegaloviral disease, unspecified: Secondary | ICD-10-CM | POA: Diagnosis not present

## 2015-12-10 DIAGNOSIS — Z5181 Encounter for therapeutic drug level monitoring: Secondary | ICD-10-CM | POA: Diagnosis not present

## 2015-12-10 DIAGNOSIS — C9000 Multiple myeloma not having achieved remission: Secondary | ICD-10-CM | POA: Diagnosis not present

## 2015-12-10 DIAGNOSIS — Z9481 Bone marrow transplant status: Secondary | ICD-10-CM | POA: Diagnosis not present

## 2015-12-10 DIAGNOSIS — D479 Neoplasm of uncertain behavior of lymphoid, hematopoietic and related tissue, unspecified: Secondary | ICD-10-CM | POA: Diagnosis not present

## 2015-12-11 DIAGNOSIS — B259 Cytomegaloviral disease, unspecified: Secondary | ICD-10-CM | POA: Diagnosis not present

## 2015-12-11 DIAGNOSIS — C9000 Multiple myeloma not having achieved remission: Secondary | ICD-10-CM | POA: Diagnosis not present

## 2015-12-11 DIAGNOSIS — Z9481 Bone marrow transplant status: Secondary | ICD-10-CM | POA: Diagnosis not present

## 2015-12-11 DIAGNOSIS — Z5181 Encounter for therapeutic drug level monitoring: Secondary | ICD-10-CM | POA: Diagnosis not present

## 2015-12-11 DIAGNOSIS — D479 Neoplasm of uncertain behavior of lymphoid, hematopoietic and related tissue, unspecified: Secondary | ICD-10-CM | POA: Diagnosis not present

## 2015-12-12 DIAGNOSIS — D479 Neoplasm of uncertain behavior of lymphoid, hematopoietic and related tissue, unspecified: Secondary | ICD-10-CM | POA: Diagnosis not present

## 2015-12-12 DIAGNOSIS — B259 Cytomegaloviral disease, unspecified: Secondary | ICD-10-CM | POA: Diagnosis not present

## 2015-12-12 DIAGNOSIS — C9000 Multiple myeloma not having achieved remission: Secondary | ICD-10-CM | POA: Diagnosis not present

## 2015-12-12 DIAGNOSIS — Z9481 Bone marrow transplant status: Secondary | ICD-10-CM | POA: Diagnosis not present

## 2015-12-12 DIAGNOSIS — Z5181 Encounter for therapeutic drug level monitoring: Secondary | ICD-10-CM | POA: Diagnosis not present

## 2015-12-13 DIAGNOSIS — Z9481 Bone marrow transplant status: Secondary | ICD-10-CM | POA: Diagnosis not present

## 2015-12-13 DIAGNOSIS — Z5181 Encounter for therapeutic drug level monitoring: Secondary | ICD-10-CM | POA: Diagnosis not present

## 2015-12-13 DIAGNOSIS — R29898 Other symptoms and signs involving the musculoskeletal system: Secondary | ICD-10-CM | POA: Diagnosis not present

## 2015-12-13 DIAGNOSIS — B259 Cytomegaloviral disease, unspecified: Secondary | ICD-10-CM | POA: Diagnosis not present

## 2015-12-13 DIAGNOSIS — M6281 Muscle weakness (generalized): Secondary | ICD-10-CM | POA: Diagnosis not present

## 2015-12-13 DIAGNOSIS — R262 Difficulty in walking, not elsewhere classified: Secondary | ICD-10-CM | POA: Diagnosis not present

## 2015-12-13 DIAGNOSIS — C9000 Multiple myeloma not having achieved remission: Secondary | ICD-10-CM | POA: Diagnosis not present

## 2015-12-13 DIAGNOSIS — D479 Neoplasm of uncertain behavior of lymphoid, hematopoietic and related tissue, unspecified: Secondary | ICD-10-CM | POA: Diagnosis not present

## 2015-12-14 DIAGNOSIS — Z79899 Other long term (current) drug therapy: Secondary | ICD-10-CM | POA: Diagnosis not present

## 2015-12-14 DIAGNOSIS — Z9484 Stem cells transplant status: Secondary | ICD-10-CM | POA: Diagnosis not present

## 2015-12-14 DIAGNOSIS — D489 Neoplasm of uncertain behavior, unspecified: Secondary | ICD-10-CM | POA: Diagnosis not present

## 2015-12-14 DIAGNOSIS — J90 Pleural effusion, not elsewhere classified: Secondary | ICD-10-CM | POA: Diagnosis not present

## 2015-12-14 DIAGNOSIS — Z5181 Encounter for therapeutic drug level monitoring: Secondary | ICD-10-CM | POA: Diagnosis not present

## 2015-12-14 DIAGNOSIS — Z48298 Encounter for aftercare following other organ transplant: Secondary | ICD-10-CM | POA: Diagnosis not present

## 2015-12-14 DIAGNOSIS — C9000 Multiple myeloma not having achieved remission: Secondary | ICD-10-CM | POA: Diagnosis not present

## 2015-12-15 DIAGNOSIS — B259 Cytomegaloviral disease, unspecified: Secondary | ICD-10-CM | POA: Diagnosis not present

## 2015-12-15 DIAGNOSIS — Z9481 Bone marrow transplant status: Secondary | ICD-10-CM | POA: Diagnosis not present

## 2015-12-15 DIAGNOSIS — Z5181 Encounter for therapeutic drug level monitoring: Secondary | ICD-10-CM | POA: Diagnosis not present

## 2015-12-15 DIAGNOSIS — D479 Neoplasm of uncertain behavior of lymphoid, hematopoietic and related tissue, unspecified: Secondary | ICD-10-CM | POA: Diagnosis not present

## 2015-12-15 DIAGNOSIS — C9000 Multiple myeloma not having achieved remission: Secondary | ICD-10-CM | POA: Diagnosis not present

## 2015-12-15 DIAGNOSIS — R938 Abnormal findings on diagnostic imaging of other specified body structures: Secondary | ICD-10-CM | POA: Diagnosis not present

## 2015-12-16 DIAGNOSIS — C9000 Multiple myeloma not having achieved remission: Secondary | ICD-10-CM | POA: Diagnosis not present

## 2015-12-16 DIAGNOSIS — R29898 Other symptoms and signs involving the musculoskeletal system: Secondary | ICD-10-CM | POA: Diagnosis not present

## 2015-12-16 DIAGNOSIS — Z5181 Encounter for therapeutic drug level monitoring: Secondary | ICD-10-CM | POA: Diagnosis not present

## 2015-12-16 DIAGNOSIS — B259 Cytomegaloviral disease, unspecified: Secondary | ICD-10-CM | POA: Diagnosis not present

## 2015-12-16 DIAGNOSIS — D479 Neoplasm of uncertain behavior of lymphoid, hematopoietic and related tissue, unspecified: Secondary | ICD-10-CM | POA: Diagnosis not present

## 2015-12-16 DIAGNOSIS — Z9481 Bone marrow transplant status: Secondary | ICD-10-CM | POA: Diagnosis not present

## 2015-12-16 DIAGNOSIS — R262 Difficulty in walking, not elsewhere classified: Secondary | ICD-10-CM | POA: Diagnosis not present

## 2015-12-16 DIAGNOSIS — M6281 Muscle weakness (generalized): Secondary | ICD-10-CM | POA: Diagnosis not present

## 2015-12-17 DIAGNOSIS — D479 Neoplasm of uncertain behavior of lymphoid, hematopoietic and related tissue, unspecified: Secondary | ICD-10-CM | POA: Diagnosis not present

## 2015-12-17 DIAGNOSIS — B259 Cytomegaloviral disease, unspecified: Secondary | ICD-10-CM | POA: Diagnosis not present

## 2015-12-17 DIAGNOSIS — Z9481 Bone marrow transplant status: Secondary | ICD-10-CM | POA: Diagnosis not present

## 2015-12-17 DIAGNOSIS — C9000 Multiple myeloma not having achieved remission: Secondary | ICD-10-CM | POA: Diagnosis not present

## 2015-12-17 DIAGNOSIS — Z5181 Encounter for therapeutic drug level monitoring: Secondary | ICD-10-CM | POA: Diagnosis not present

## 2015-12-18 DIAGNOSIS — Z9481 Bone marrow transplant status: Secondary | ICD-10-CM | POA: Diagnosis not present

## 2015-12-18 DIAGNOSIS — D479 Neoplasm of uncertain behavior of lymphoid, hematopoietic and related tissue, unspecified: Secondary | ICD-10-CM | POA: Diagnosis not present

## 2015-12-18 DIAGNOSIS — C9000 Multiple myeloma not having achieved remission: Secondary | ICD-10-CM | POA: Diagnosis not present

## 2015-12-18 DIAGNOSIS — B259 Cytomegaloviral disease, unspecified: Secondary | ICD-10-CM | POA: Diagnosis not present

## 2015-12-18 DIAGNOSIS — Z5181 Encounter for therapeutic drug level monitoring: Secondary | ICD-10-CM | POA: Diagnosis not present

## 2015-12-19 DIAGNOSIS — D479 Neoplasm of uncertain behavior of lymphoid, hematopoietic and related tissue, unspecified: Secondary | ICD-10-CM | POA: Diagnosis not present

## 2015-12-19 DIAGNOSIS — Z5181 Encounter for therapeutic drug level monitoring: Secondary | ICD-10-CM | POA: Diagnosis not present

## 2015-12-19 DIAGNOSIS — C9000 Multiple myeloma not having achieved remission: Secondary | ICD-10-CM | POA: Diagnosis not present

## 2015-12-19 DIAGNOSIS — B259 Cytomegaloviral disease, unspecified: Secondary | ICD-10-CM | POA: Diagnosis not present

## 2015-12-19 DIAGNOSIS — Z9481 Bone marrow transplant status: Secondary | ICD-10-CM | POA: Diagnosis not present

## 2015-12-20 DIAGNOSIS — C9000 Multiple myeloma not having achieved remission: Secondary | ICD-10-CM | POA: Diagnosis not present

## 2015-12-20 DIAGNOSIS — Z5181 Encounter for therapeutic drug level monitoring: Secondary | ICD-10-CM | POA: Diagnosis not present

## 2015-12-20 DIAGNOSIS — D479 Neoplasm of uncertain behavior of lymphoid, hematopoietic and related tissue, unspecified: Secondary | ICD-10-CM | POA: Diagnosis not present

## 2015-12-20 DIAGNOSIS — B259 Cytomegaloviral disease, unspecified: Secondary | ICD-10-CM | POA: Diagnosis not present

## 2015-12-20 DIAGNOSIS — Z9481 Bone marrow transplant status: Secondary | ICD-10-CM | POA: Diagnosis not present

## 2015-12-21 DIAGNOSIS — Z9481 Bone marrow transplant status: Secondary | ICD-10-CM | POA: Diagnosis not present

## 2015-12-21 DIAGNOSIS — B259 Cytomegaloviral disease, unspecified: Secondary | ICD-10-CM | POA: Diagnosis not present

## 2015-12-21 DIAGNOSIS — C9000 Multiple myeloma not having achieved remission: Secondary | ICD-10-CM | POA: Diagnosis not present

## 2015-12-21 DIAGNOSIS — Z5181 Encounter for therapeutic drug level monitoring: Secondary | ICD-10-CM | POA: Diagnosis not present

## 2015-12-21 DIAGNOSIS — D479 Neoplasm of uncertain behavior of lymphoid, hematopoietic and related tissue, unspecified: Secondary | ICD-10-CM | POA: Diagnosis not present

## 2015-12-21 DIAGNOSIS — Z5111 Encounter for antineoplastic chemotherapy: Secondary | ICD-10-CM | POA: Diagnosis not present

## 2015-12-21 DIAGNOSIS — J90 Pleural effusion, not elsewhere classified: Secondary | ICD-10-CM | POA: Diagnosis not present

## 2015-12-22 DIAGNOSIS — Z5181 Encounter for therapeutic drug level monitoring: Secondary | ICD-10-CM | POA: Diagnosis not present

## 2015-12-22 DIAGNOSIS — C9000 Multiple myeloma not having achieved remission: Secondary | ICD-10-CM | POA: Diagnosis not present

## 2015-12-22 DIAGNOSIS — B259 Cytomegaloviral disease, unspecified: Secondary | ICD-10-CM | POA: Diagnosis not present

## 2015-12-22 DIAGNOSIS — D479 Neoplasm of uncertain behavior of lymphoid, hematopoietic and related tissue, unspecified: Secondary | ICD-10-CM | POA: Diagnosis not present

## 2015-12-22 DIAGNOSIS — Z9481 Bone marrow transplant status: Secondary | ICD-10-CM | POA: Diagnosis not present

## 2015-12-23 DIAGNOSIS — Z9481 Bone marrow transplant status: Secondary | ICD-10-CM | POA: Diagnosis not present

## 2015-12-23 DIAGNOSIS — D479 Neoplasm of uncertain behavior of lymphoid, hematopoietic and related tissue, unspecified: Secondary | ICD-10-CM | POA: Diagnosis not present

## 2015-12-23 DIAGNOSIS — C9000 Multiple myeloma not having achieved remission: Secondary | ICD-10-CM | POA: Diagnosis not present

## 2015-12-23 DIAGNOSIS — B259 Cytomegaloviral disease, unspecified: Secondary | ICD-10-CM | POA: Diagnosis not present

## 2015-12-23 DIAGNOSIS — Z5181 Encounter for therapeutic drug level monitoring: Secondary | ICD-10-CM | POA: Diagnosis not present

## 2015-12-24 DIAGNOSIS — C9000 Multiple myeloma not having achieved remission: Secondary | ICD-10-CM | POA: Diagnosis not present

## 2015-12-24 DIAGNOSIS — D479 Neoplasm of uncertain behavior of lymphoid, hematopoietic and related tissue, unspecified: Secondary | ICD-10-CM | POA: Diagnosis not present

## 2015-12-24 DIAGNOSIS — Z9481 Bone marrow transplant status: Secondary | ICD-10-CM | POA: Diagnosis not present

## 2015-12-24 DIAGNOSIS — Z5181 Encounter for therapeutic drug level monitoring: Secondary | ICD-10-CM | POA: Diagnosis not present

## 2015-12-24 DIAGNOSIS — B259 Cytomegaloviral disease, unspecified: Secondary | ICD-10-CM | POA: Diagnosis not present

## 2015-12-24 DIAGNOSIS — R768 Other specified abnormal immunological findings in serum: Secondary | ICD-10-CM | POA: Diagnosis not present

## 2015-12-25 DIAGNOSIS — C9000 Multiple myeloma not having achieved remission: Secondary | ICD-10-CM | POA: Diagnosis not present

## 2015-12-25 DIAGNOSIS — B259 Cytomegaloviral disease, unspecified: Secondary | ICD-10-CM | POA: Diagnosis not present

## 2015-12-25 DIAGNOSIS — D479 Neoplasm of uncertain behavior of lymphoid, hematopoietic and related tissue, unspecified: Secondary | ICD-10-CM | POA: Diagnosis not present

## 2015-12-25 DIAGNOSIS — Z5181 Encounter for therapeutic drug level monitoring: Secondary | ICD-10-CM | POA: Diagnosis not present

## 2015-12-25 DIAGNOSIS — Z9481 Bone marrow transplant status: Secondary | ICD-10-CM | POA: Diagnosis not present

## 2015-12-26 DIAGNOSIS — B259 Cytomegaloviral disease, unspecified: Secondary | ICD-10-CM | POA: Diagnosis not present

## 2015-12-26 DIAGNOSIS — D479 Neoplasm of uncertain behavior of lymphoid, hematopoietic and related tissue, unspecified: Secondary | ICD-10-CM | POA: Diagnosis not present

## 2015-12-26 DIAGNOSIS — Z9481 Bone marrow transplant status: Secondary | ICD-10-CM | POA: Diagnosis not present

## 2015-12-26 DIAGNOSIS — Z5181 Encounter for therapeutic drug level monitoring: Secondary | ICD-10-CM | POA: Diagnosis not present

## 2015-12-26 DIAGNOSIS — C9000 Multiple myeloma not having achieved remission: Secondary | ICD-10-CM | POA: Diagnosis not present

## 2015-12-27 DIAGNOSIS — D479 Neoplasm of uncertain behavior of lymphoid, hematopoietic and related tissue, unspecified: Secondary | ICD-10-CM | POA: Diagnosis not present

## 2015-12-27 DIAGNOSIS — J90 Pleural effusion, not elsewhere classified: Secondary | ICD-10-CM | POA: Diagnosis not present

## 2015-12-27 DIAGNOSIS — Z5181 Encounter for therapeutic drug level monitoring: Secondary | ICD-10-CM | POA: Diagnosis not present

## 2015-12-27 DIAGNOSIS — B259 Cytomegaloviral disease, unspecified: Secondary | ICD-10-CM | POA: Diagnosis not present

## 2015-12-27 DIAGNOSIS — Z9481 Bone marrow transplant status: Secondary | ICD-10-CM | POA: Diagnosis not present

## 2015-12-27 DIAGNOSIS — C9000 Multiple myeloma not having achieved remission: Secondary | ICD-10-CM | POA: Diagnosis not present

## 2015-12-28 DIAGNOSIS — Z9481 Bone marrow transplant status: Secondary | ICD-10-CM | POA: Diagnosis not present

## 2015-12-28 DIAGNOSIS — C9000 Multiple myeloma not having achieved remission: Secondary | ICD-10-CM | POA: Diagnosis not present

## 2015-12-28 DIAGNOSIS — Z5181 Encounter for therapeutic drug level monitoring: Secondary | ICD-10-CM | POA: Diagnosis not present

## 2015-12-28 DIAGNOSIS — B259 Cytomegaloviral disease, unspecified: Secondary | ICD-10-CM | POA: Diagnosis not present

## 2015-12-28 DIAGNOSIS — D479 Neoplasm of uncertain behavior of lymphoid, hematopoietic and related tissue, unspecified: Secondary | ICD-10-CM | POA: Diagnosis not present

## 2015-12-29 ENCOUNTER — Telehealth: Payer: Self-pay | Admitting: Hematology and Oncology

## 2015-12-29 ENCOUNTER — Other Ambulatory Visit: Payer: Self-pay | Admitting: Hematology and Oncology

## 2015-12-29 DIAGNOSIS — Z5181 Encounter for therapeutic drug level monitoring: Secondary | ICD-10-CM | POA: Diagnosis not present

## 2015-12-29 DIAGNOSIS — C9000 Multiple myeloma not having achieved remission: Secondary | ICD-10-CM | POA: Diagnosis not present

## 2015-12-29 DIAGNOSIS — Z9481 Bone marrow transplant status: Secondary | ICD-10-CM | POA: Diagnosis not present

## 2015-12-29 DIAGNOSIS — D479 Neoplasm of uncertain behavior of lymphoid, hematopoietic and related tissue, unspecified: Secondary | ICD-10-CM | POA: Diagnosis not present

## 2015-12-29 DIAGNOSIS — D469 Myelodysplastic syndrome, unspecified: Secondary | ICD-10-CM

## 2015-12-29 DIAGNOSIS — B259 Cytomegaloviral disease, unspecified: Secondary | ICD-10-CM | POA: Diagnosis not present

## 2015-12-29 NOTE — Telephone Encounter (Signed)
I spoke with Jean Davidson from [facility]. The patient may be returning back to [facility] after successful bone marrow transplant. She would need to be seen here around 01/10/2016 for possible transfusion support, G-CSF and blood draw. 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 I will set up return appointment with blood work, physician appointment and possible injection appointment on 01/10/2016

## 2015-12-29 NOTE — Telephone Encounter (Signed)
called all numbers no vm set up.

## 2015-12-30 DIAGNOSIS — M6281 Muscle weakness (generalized): Secondary | ICD-10-CM | POA: Diagnosis not present

## 2015-12-30 DIAGNOSIS — R29898 Other symptoms and signs involving the musculoskeletal system: Secondary | ICD-10-CM | POA: Diagnosis not present

## 2015-12-30 DIAGNOSIS — Z5181 Encounter for therapeutic drug level monitoring: Secondary | ICD-10-CM | POA: Diagnosis not present

## 2015-12-30 DIAGNOSIS — C9000 Multiple myeloma not having achieved remission: Secondary | ICD-10-CM | POA: Diagnosis not present

## 2015-12-30 DIAGNOSIS — B259 Cytomegaloviral disease, unspecified: Secondary | ICD-10-CM | POA: Diagnosis not present

## 2015-12-30 DIAGNOSIS — D479 Neoplasm of uncertain behavior of lymphoid, hematopoietic and related tissue, unspecified: Secondary | ICD-10-CM | POA: Diagnosis not present

## 2015-12-30 DIAGNOSIS — R262 Difficulty in walking, not elsewhere classified: Secondary | ICD-10-CM | POA: Diagnosis not present

## 2015-12-31 DIAGNOSIS — Z5181 Encounter for therapeutic drug level monitoring: Secondary | ICD-10-CM | POA: Diagnosis not present

## 2015-12-31 DIAGNOSIS — Z9481 Bone marrow transplant status: Secondary | ICD-10-CM | POA: Diagnosis not present

## 2015-12-31 DIAGNOSIS — B259 Cytomegaloviral disease, unspecified: Secondary | ICD-10-CM | POA: Diagnosis not present

## 2015-12-31 DIAGNOSIS — D479 Neoplasm of uncertain behavior of lymphoid, hematopoietic and related tissue, unspecified: Secondary | ICD-10-CM | POA: Diagnosis not present

## 2015-12-31 DIAGNOSIS — C9000 Multiple myeloma not having achieved remission: Secondary | ICD-10-CM | POA: Diagnosis not present

## 2015-12-31 DIAGNOSIS — R768 Other specified abnormal immunological findings in serum: Secondary | ICD-10-CM | POA: Diagnosis not present

## 2016-01-01 DIAGNOSIS — Z5181 Encounter for therapeutic drug level monitoring: Secondary | ICD-10-CM | POA: Diagnosis not present

## 2016-01-01 DIAGNOSIS — D479 Neoplasm of uncertain behavior of lymphoid, hematopoietic and related tissue, unspecified: Secondary | ICD-10-CM | POA: Diagnosis not present

## 2016-01-01 DIAGNOSIS — B259 Cytomegaloviral disease, unspecified: Secondary | ICD-10-CM | POA: Diagnosis not present

## 2016-01-01 DIAGNOSIS — C9000 Multiple myeloma not having achieved remission: Secondary | ICD-10-CM | POA: Diagnosis not present

## 2016-01-02 DIAGNOSIS — C9000 Multiple myeloma not having achieved remission: Secondary | ICD-10-CM | POA: Diagnosis not present

## 2016-01-02 DIAGNOSIS — Z5181 Encounter for therapeutic drug level monitoring: Secondary | ICD-10-CM | POA: Diagnosis not present

## 2016-01-02 DIAGNOSIS — D479 Neoplasm of uncertain behavior of lymphoid, hematopoietic and related tissue, unspecified: Secondary | ICD-10-CM | POA: Diagnosis not present

## 2016-01-02 DIAGNOSIS — B259 Cytomegaloviral disease, unspecified: Secondary | ICD-10-CM | POA: Diagnosis not present

## 2016-01-03 DIAGNOSIS — R262 Difficulty in walking, not elsewhere classified: Secondary | ICD-10-CM | POA: Diagnosis not present

## 2016-01-03 DIAGNOSIS — B259 Cytomegaloviral disease, unspecified: Secondary | ICD-10-CM | POA: Diagnosis not present

## 2016-01-03 DIAGNOSIS — C9000 Multiple myeloma not having achieved remission: Secondary | ICD-10-CM | POA: Diagnosis not present

## 2016-01-03 DIAGNOSIS — R600 Localized edema: Secondary | ICD-10-CM | POA: Diagnosis not present

## 2016-01-03 DIAGNOSIS — M6281 Muscle weakness (generalized): Secondary | ICD-10-CM | POA: Diagnosis not present

## 2016-01-03 DIAGNOSIS — R29898 Other symptoms and signs involving the musculoskeletal system: Secondary | ICD-10-CM | POA: Diagnosis not present

## 2016-01-03 DIAGNOSIS — Z9481 Bone marrow transplant status: Secondary | ICD-10-CM | POA: Diagnosis not present

## 2016-01-03 DIAGNOSIS — Z5181 Encounter for therapeutic drug level monitoring: Secondary | ICD-10-CM | POA: Diagnosis not present

## 2016-01-03 DIAGNOSIS — D479 Neoplasm of uncertain behavior of lymphoid, hematopoietic and related tissue, unspecified: Secondary | ICD-10-CM | POA: Diagnosis not present

## 2016-01-04 DIAGNOSIS — C9000 Multiple myeloma not having achieved remission: Secondary | ICD-10-CM | POA: Diagnosis not present

## 2016-01-04 DIAGNOSIS — Z9481 Bone marrow transplant status: Secondary | ICD-10-CM | POA: Diagnosis not present

## 2016-01-04 DIAGNOSIS — B259 Cytomegaloviral disease, unspecified: Secondary | ICD-10-CM | POA: Diagnosis not present

## 2016-01-04 DIAGNOSIS — R918 Other nonspecific abnormal finding of lung field: Secondary | ICD-10-CM | POA: Diagnosis not present

## 2016-01-04 DIAGNOSIS — Z5181 Encounter for therapeutic drug level monitoring: Secondary | ICD-10-CM | POA: Diagnosis not present

## 2016-01-04 DIAGNOSIS — D479 Neoplasm of uncertain behavior of lymphoid, hematopoietic and related tissue, unspecified: Secondary | ICD-10-CM | POA: Diagnosis not present

## 2016-01-05 ENCOUNTER — Encounter: Payer: Self-pay | Admitting: Hematology and Oncology

## 2016-01-05 DIAGNOSIS — D479 Neoplasm of uncertain behavior of lymphoid, hematopoietic and related tissue, unspecified: Secondary | ICD-10-CM | POA: Diagnosis not present

## 2016-01-05 DIAGNOSIS — Z5181 Encounter for therapeutic drug level monitoring: Secondary | ICD-10-CM | POA: Diagnosis not present

## 2016-01-05 DIAGNOSIS — C9000 Multiple myeloma not having achieved remission: Secondary | ICD-10-CM | POA: Diagnosis not present

## 2016-01-05 DIAGNOSIS — B259 Cytomegaloviral disease, unspecified: Secondary | ICD-10-CM | POA: Diagnosis not present

## 2016-01-05 DIAGNOSIS — Z9481 Bone marrow transplant status: Secondary | ICD-10-CM | POA: Diagnosis not present

## 2016-01-07 DIAGNOSIS — R911 Solitary pulmonary nodule: Secondary | ICD-10-CM | POA: Diagnosis not present

## 2016-01-07 DIAGNOSIS — D469 Myelodysplastic syndrome, unspecified: Secondary | ICD-10-CM | POA: Diagnosis not present

## 2016-01-07 DIAGNOSIS — R600 Localized edema: Secondary | ICD-10-CM | POA: Diagnosis not present

## 2016-01-07 DIAGNOSIS — Z792 Long term (current) use of antibiotics: Secondary | ICD-10-CM | POA: Diagnosis not present

## 2016-01-07 DIAGNOSIS — J90 Pleural effusion, not elsewhere classified: Secondary | ICD-10-CM | POA: Diagnosis not present

## 2016-01-07 DIAGNOSIS — J9811 Atelectasis: Secondary | ICD-10-CM | POA: Diagnosis not present

## 2016-01-07 DIAGNOSIS — I10 Essential (primary) hypertension: Secondary | ICD-10-CM | POA: Diagnosis not present

## 2016-01-07 DIAGNOSIS — R5383 Other fatigue: Secondary | ICD-10-CM | POA: Diagnosis not present

## 2016-01-07 DIAGNOSIS — C9000 Multiple myeloma not having achieved remission: Secondary | ICD-10-CM | POA: Diagnosis not present

## 2016-01-07 DIAGNOSIS — I313 Pericardial effusion (noninflammatory): Secondary | ICD-10-CM | POA: Diagnosis not present

## 2016-01-07 DIAGNOSIS — I700 Atherosclerosis of aorta: Secondary | ICD-10-CM | POA: Diagnosis not present

## 2016-01-09 ENCOUNTER — Encounter: Payer: Self-pay | Admitting: Hematology and Oncology

## 2016-01-10 ENCOUNTER — Ambulatory Visit: Payer: Medicare HMO

## 2016-01-10 ENCOUNTER — Other Ambulatory Visit: Payer: Self-pay | Admitting: *Deleted

## 2016-01-10 ENCOUNTER — Ambulatory Visit (HOSPITAL_BASED_OUTPATIENT_CLINIC_OR_DEPARTMENT_OTHER): Payer: Medicare HMO | Admitting: Hematology and Oncology

## 2016-01-10 ENCOUNTER — Ambulatory Visit (HOSPITAL_BASED_OUTPATIENT_CLINIC_OR_DEPARTMENT_OTHER): Payer: Medicare HMO

## 2016-01-10 ENCOUNTER — Telehealth: Payer: Self-pay | Admitting: Hematology and Oncology

## 2016-01-10 ENCOUNTER — Other Ambulatory Visit (HOSPITAL_BASED_OUTPATIENT_CLINIC_OR_DEPARTMENT_OTHER): Payer: Medicare HMO

## 2016-01-10 ENCOUNTER — Other Ambulatory Visit: Payer: Self-pay | Admitting: Hematology and Oncology

## 2016-01-10 ENCOUNTER — Other Ambulatory Visit: Payer: Medicare HMO

## 2016-01-10 VITALS — BP 155/83 | HR 89 | Temp 97.8°F | Resp 18 | Ht 63.0 in | Wt 140.1 lb

## 2016-01-10 DIAGNOSIS — C9001 Multiple myeloma in remission: Secondary | ICD-10-CM | POA: Diagnosis not present

## 2016-01-10 DIAGNOSIS — C9002 Multiple myeloma in relapse: Secondary | ICD-10-CM

## 2016-01-10 DIAGNOSIS — Z9481 Bone marrow transplant status: Secondary | ICD-10-CM | POA: Diagnosis not present

## 2016-01-10 DIAGNOSIS — D701 Agranulocytosis secondary to cancer chemotherapy: Secondary | ICD-10-CM

## 2016-01-10 DIAGNOSIS — Z452 Encounter for adjustment and management of vascular access device: Secondary | ICD-10-CM

## 2016-01-10 DIAGNOSIS — R600 Localized edema: Secondary | ICD-10-CM

## 2016-01-10 DIAGNOSIS — D469 Myelodysplastic syndrome, unspecified: Secondary | ICD-10-CM

## 2016-01-10 DIAGNOSIS — D4620 Refractory anemia with excess of blasts, unspecified: Secondary | ICD-10-CM

## 2016-01-10 DIAGNOSIS — D61818 Other pancytopenia: Secondary | ICD-10-CM | POA: Diagnosis not present

## 2016-01-10 DIAGNOSIS — T451X5A Adverse effect of antineoplastic and immunosuppressive drugs, initial encounter: Secondary | ICD-10-CM

## 2016-01-10 DIAGNOSIS — R5381 Other malaise: Secondary | ICD-10-CM

## 2016-01-10 DIAGNOSIS — B259 Cytomegaloviral disease, unspecified: Secondary | ICD-10-CM

## 2016-01-10 LAB — CBC WITH DIFFERENTIAL/PLATELET
BASO%: 0 % (ref 0.0–2.0)
Basophils Absolute: 0 10*3/uL (ref 0.0–0.1)
EOS%: 0.5 % (ref 0.0–7.0)
Eosinophils Absolute: 0 10*3/uL (ref 0.0–0.5)
HCT: 28.5 % — ABNORMAL LOW (ref 34.8–46.6)
HGB: 9.5 g/dL — ABNORMAL LOW (ref 11.6–15.9)
LYMPH%: 30.5 % (ref 14.0–49.7)
MCH: 33.3 pg (ref 25.1–34.0)
MCHC: 33.3 g/dL (ref 31.5–36.0)
MCV: 100 fL (ref 79.5–101.0)
MONO#: 0.1 10*3/uL (ref 0.1–0.9)
MONO%: 4.7 % (ref 0.0–14.0)
NEUT#: 1.4 10*3/uL — ABNORMAL LOW (ref 1.5–6.5)
NEUT%: 64.3 % (ref 38.4–76.8)
Platelets: 109 10*3/uL — ABNORMAL LOW (ref 145–400)
RBC: 2.85 10*6/uL — ABNORMAL LOW (ref 3.70–5.45)
RDW: 18.4 % — ABNORMAL HIGH (ref 11.2–14.5)
WBC: 2.1 10*3/uL — ABNORMAL LOW (ref 3.9–10.3)
lymph#: 0.7 10*3/uL — ABNORMAL LOW (ref 0.9–3.3)

## 2016-01-10 LAB — COMPREHENSIVE METABOLIC PANEL
ALT: <9 U/L (ref 0–55)
AST: 28 U/L (ref 5–34)
Albumin: 3.4 g/dL — ABNORMAL LOW (ref 3.5–5.0)
Alkaline Phosphatase: 84 U/L (ref 40–150)
Anion Gap: 9 mEq/L (ref 3–11)
BUN: 8.4 mg/dL (ref 7.0–26.0)
CO2: 22 mEq/L (ref 22–29)
Calcium: 8.4 mg/dL (ref 8.4–10.4)
Chloride: 112 mEq/L — ABNORMAL HIGH (ref 98–109)
Creatinine: 0.8 mg/dL (ref 0.6–1.1)
EGFR: 74 mL/min/{1.73_m2} — ABNORMAL LOW (ref >90)
Glucose: 116 mg/dl (ref 70–140)
Potassium: 4.1 mEq/L (ref 3.5–5.1)
Sodium: 143 mEq/L (ref 136–145)
Total Bilirubin: 0.43 mg/dL (ref 0.20–1.20)
Total Protein: 5.4 g/dL — ABNORMAL LOW (ref 6.4–8.3)

## 2016-01-10 LAB — MAGNESIUM: Magnesium: 1.4 mg/dl — CL (ref 1.5–2.5)

## 2016-01-10 MED ORDER — HEPARIN SOD (PORK) LOCK FLUSH 100 UNIT/ML IV SOLN
500.0000 [IU] | Freq: Once | INTRAVENOUS | Status: DC | PRN
  Filled 2016-01-10: qty 5

## 2016-01-10 MED ORDER — SODIUM CHLORIDE 0.9 % IJ SOLN
10.0000 mL | INTRAMUSCULAR | Status: DC | PRN
  Administered 2016-01-10: 10 mL via INTRAVENOUS
  Filled 2016-01-10: qty 10

## 2016-01-10 MED ORDER — HEPARIN SOD (PORK) LOCK FLUSH 100 UNIT/ML IV SOLN
500.0000 [IU] | Freq: Once | INTRAVENOUS | Status: AC | PRN
  Administered 2016-01-10: 500 [IU] via INTRAVENOUS
  Filled 2016-01-10: qty 5

## 2016-01-10 MED ORDER — SODIUM CHLORIDE 0.9 % IV SOLN
2.0000 g | Freq: Once | INTRAVENOUS | Status: AC
  Administered 2016-01-10: 2 g via INTRAVENOUS
  Filled 2016-01-10: qty 4

## 2016-01-10 MED ORDER — SODIUM CHLORIDE 0.9 % IJ SOLN
10.0000 mL | INTRAMUSCULAR | Status: DC | PRN
  Filled 2016-01-10: qty 10

## 2016-01-10 MED ORDER — TBO-FILGRASTIM 480 MCG/0.8ML ~~LOC~~ SOSY
480.0000 ug | PREFILLED_SYRINGE | Freq: Once | SUBCUTANEOUS | Status: AC
  Administered 2016-01-10: 480 ug via SUBCUTANEOUS
  Filled 2016-01-10: qty 0.8

## 2016-01-10 NOTE — Progress Notes (Signed)
[facility] OFFICE PROGRESS NOTE  Patient Care Team: Amy E Bedsole, MD as PCP - General (Family Medicine) Rakhee Rajan Vaidya, MD as Referring Physician (Hematology and Oncology) Cristina Gasparetto, MD as Consulting Physician (Internal Medicine) Cornelius N Van Dam, MD as Consulting Physician (Infectious Diseases) Nelson Jen An Chao, MD as Consulting Physician (Hematology and Oncology) Anderson Hicks Garrett, NP as Nurse Practitioner (Hematology and Oncology)  SUMMARY OF ONCOLOGIC HISTORY: Oncology History   Multiple myeloma, Ig A Lambda, M spike 3.54 grams, Calcium 9.2, Creatinine 0.8, Beta 2 microglobulin 4.52, IgA 4840 mg/dL, lambda light chain 75.4, albumin 3.6, hemoglobin 9.7, platelet 115    Primary site: Multiple Myeloma   Staging method: AJCC 6th Edition   Clinical: Stage IIA signed by Ni Gorsuch, MD on 11/07/2013  2:46 PM   Summary: Stage IIA        Multiple myeloma in relapse (HCC)   10/31/2013 Bone Marrow Biopsy Bone marrow biopsy confirmed multiple myeloma with 40% bone marrow involvement. Skeletal survey showed minimal lesions in her score with generalized demineralization   11/10/2013 - 02/13/2014 Chemotherapy The patient is started on induction chemotherapy with weekly dexamethasone 40 mg by mouth as well as Velcade subcutaneous injection on days 1, 4, 8 and 11. On 11/21/2013, she was started on monthly Zometa.   12/23/2013 Adverse Reaction The dose of Velcade was reduced due to thrombocytopenia.   01/28/2014 - 04/07/2014 Chemotherapy Revlimid is added. Treatment was discontinued due to lack of response.   02/24/2014 - 04/07/2014 Chemotherapy Due to worsening peripheral neuropathy, Velcade injection is changed to once a week. Revlimid was given 21 days on, 7 days off.   04/07/2014 - 04/10/2014 Chemotherapy Revlimid was discontinued due to lack of response. Chemotherapy was changed back to Velcade injection twice a week, 2 weeks on 1 week off. Her treatment was  switched to to minimum response   04/20/2014 - 06/02/2014 Chemotherapy chemotherapy is switched to Carfilzomib, Cytoxan and dexamethasone.   04/22/2014 Procedure she has placement of port for chemotherapy.   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 [facility]   10/20/2014 - 03/24/2015 Chemotherapy  she received chemotherapy with Kyprolis, Revlimid and dexamethasone   10/22/2014 Procedure She has port placement   01/19/2015 Tumor Marker IgA lambda M spike at 0.4 g    01/20/2015 Miscellaneous 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 No cource was found and fever resolved with IV vancomycin and meropenem   09/13/2015 Bone Marrow Biopsy Bone marrow biopsy showed no increased blasts, 3-4 % plasma cells    MDS (myelodysplastic syndrome) (HCC)   04/06/2015 Bone Marrow Biopsy Accession: [id number] BM biopsy showed RAEB-1   04/06/2015 Tumor Marker Cytogenetics and FISH for MDS are within normal limits   10/06/2015 - 10/10/2015   Chemotherapy She received conditioning chemotherapy with busulfan and melphalan   10/12/2015 Bone Marrow Transplant She received allogenic stem cell transplant   10/19/2015 Adverse Reaction She  developed posttransplant complication with mucositis, viral infection with rhinovirus, neutropenic fever, bilateral pleural effusion and moderate pericardial effusion and CMV reactivation.   10/31/2015 Miscellaneous She has engrafted    INTERVAL HISTORY: Please see below for problem oriented charting. She returns for further follow-up after recent bone marrow transplant. She has generalized fatigue and deconditioning. She complained of bilateral lower extremity edema. She has loose bowel movement.  Her appetite is poor She denies cough, shortness of breath fever or chills Denies skin rashes  REVIEW OF SYSTEMS:   Constitutional: Denies fevers, chills or abnormal weight loss Eyes: Denies blurriness of vision Ears, nose, mouth, throat, and face: Denies mucositis or sore throat Respiratory: Denies cough, dyspnea or wheezes Cardiovascular: Denies palpitation, chest discomfort  Gastrointestinal:  Denies nausea, heartburn or change in bowel habits Skin: Denies abnormal skin rashes Lymphatics: Denies new lymphadenopathy or easy bruising Neurological:Denies numbness, tingling or new weaknesses Behavioral/Psych: Mood is stable, no new changes  All other systems were reviewed with the patient and are negative.  I have reviewed the past medical history, past surgical history, social history and family history with the patient and they are unchanged from previous note.  ALLERGIES:  is allergic to penicillins and sulfa antibiotics.  MEDICATIONS:  Current Outpatient Prescriptions  Medication Sig Dispense Refill  . acetaminophen (TYLENOL) 500 MG tablet Take 1,500 mg by mouth as needed for moderate pain, fever or headache.     . acyclovir (ZOVIRAX) 400 MG tablet TAKE 1 TABLET (400 MG TOTAL) BY MOUTH 2 (TWO) TIMES DAILY. 180 tablet 3  . allopurinol (ZYLOPRIM) 300 MG tablet Take 300 mg by mouth daily.    . ALPRAZolam (XANAX) 0.5 MG tablet Take 1 tablet (0.5 mg total) by mouth 2 (two) times daily as  needed for anxiety. 60 tablet 0  . calcium carbonate (TUMS EX) 750 MG chewable tablet Chew 1 tablet by mouth 3 (three) times daily as needed for heartburn.     . carvedilol (COREG) 6.25 MG tablet Take 0.5 tablets (3.125 mg total) by mouth 2 (two) times daily with a meal. 90 tablet 3  . cholecalciferol (VITAMIN D) 1000 UNITS tablet Take 1,000 Units by mouth daily.    . daratumumab 16 mg/kg in sodium chloride 0.9 % Inject 1,100 mg into the vein once.    . docusate sodium (COLACE) 100 MG capsule Take 100 mg by mouth 2 (two) times daily as needed for mild constipation or moderate constipation.     . lidocaine-prilocaine (EMLA) cream Apply 1 application topically as needed.    . loperamide (IMODIUM) 2 MG capsule Take by mouth as needed for diarrhea or loose stools.    . mirtazapine (REMERON) 30 MG tablet Take 1 tablet (30 mg total) by mouth at bedtime. 90 tablet 3  . Multiple Vitamin (MULTIVITAMIN WITH MINERALS) TABS tablet Take 1 tablet by mouth daily.    . predniSONE (DELTASONE) 10 MG tablet Take 10 mg by mouth as directed. Take for two days after treatment  0  . sodium chloride (OCEAN) 0.65 % SOLN nasal spray Place 1 spray into both nostrils as needed for congestion.  0   Current Facility-Administered Medications  Medication Dose Route Frequency Provider Last Rate Last Dose  . magnesium sulfate 2 g in sodium chloride 0.9 % 500 mL  2 g Intravenous Once Ni Gorsuch, MD 168   mL/hr at 01/10/16 1509 2 g at 01/10/16 1509    PHYSICAL EXAMINATION: ECOG PERFORMANCE STATUS: 1 - Symptomatic but completely ambulatory  Filed Vitals:   01/10/16 1237  BP: 155/83  Pulse: 89  Temp: 97.8 F (36.6 C)  Resp: 18   Filed Weights   01/10/16 1237  Weight: 140 lb 1.6 oz (63.549 kg)    GENERAL:alert, no distress and comfortable SKIN: skin color, texture, turgor are normal, no rashes or significant lesions EYES: normal, Conjunctiva are pink and non-injected, sclera clear OROPHARYNX:no exudate, no erythema and  lips, buccal mucosa, and tongue normal  NECK: supple, thyroid normal size, non-tender, without nodularity LYMPH:  no palpable lymphadenopathy in the cervical, axillary or inguinal LUNGS: clear to auscultation and percussion with normal breathing effort HEART: regular rate & rhythm and no murmurs and no lower extremity edema ABDOMEN:abdomen soft, non-tender and normal bowel sounds Musculoskeletal:no cyanosis of digits and no clubbing  NEURO: alert & oriented x 3 with fluent speech, no focal motor/sensory deficits  LABORATORY DATA:  I have reviewed the data as listed    Component Value Date/Time   NA 143 01/10/2016 1202   NA 137 08/06/2015 0530   K 4.1 01/10/2016 1202   K 4.0 08/06/2015 0530   CL 106 08/06/2015 0530   CO2 22 01/10/2016 1202   CO2 25 08/06/2015 0530   GLUCOSE 116 01/10/2016 1202   GLUCOSE 94 08/06/2015 0530   BUN 8.4 01/10/2016 1202   BUN 7 08/06/2015 0530   CREATININE 0.8 01/10/2016 1202   CREATININE 0.72 08/06/2015 0530   CALCIUM 8.4 01/10/2016 1202   CALCIUM 8.1* 08/06/2015 0530   PROT 5.4* 01/10/2016 1202   PROT 5.0* 08/04/2015 0321   ALBUMIN 3.4* 01/10/2016 1202   ALBUMIN 3.2* 08/04/2015 0321   AST 28 01/10/2016 1202   AST 11* 08/04/2015 0321   ALT <9 01/10/2016 1202   ALT 8* 08/04/2015 0321   ALKPHOS 84 01/10/2016 1202   ALKPHOS 24* 08/04/2015 0321   BILITOT 0.43 01/10/2016 1202   BILITOT 0.6 08/04/2015 0321   GFRNONAA >60 08/06/2015 0530   GFRAA >60 08/06/2015 0530    No results found for: SPEP, UPEP  Lab Results  Component Value Date   WBC 2.1* 01/10/2016   NEUTROABS 1.4* 01/10/2016   HGB 9.5* 01/10/2016   HCT 28.5* 01/10/2016   MCV 100.0 01/10/2016   PLT 109* 01/10/2016      Chemistry      Component Value Date/Time   NA 143 01/10/2016 1202   NA 137 08/06/2015 0530   K 4.1 01/10/2016 1202   K 4.0 08/06/2015 0530   CL 106 08/06/2015 0530   CO2 22 01/10/2016 1202   CO2 25 08/06/2015 0530   BUN 8.4 01/10/2016 1202   BUN 7  08/06/2015 0530   CREATININE 0.8 01/10/2016 1202   CREATININE 0.72 08/06/2015 0530      Component Value Date/Time   CALCIUM 8.4 01/10/2016 1202   CALCIUM 8.1* 08/06/2015 0530   ALKPHOS 84 01/10/2016 1202   ALKPHOS 24* 08/04/2015 0321   AST 28 01/10/2016 1202   AST 11* 08/04/2015 0321   ALT <9 01/10/2016 1202   ALT 8* 08/04/2015 0321   BILITOT 0.43 01/10/2016 1202   BILITOT 0.6 08/04/2015 0321      ASSESSMENT & PLAN:  Multiple myeloma in remission ([place]) Her last bone marrow biopsy prior to bone marrow transplant showed near-complete remission. She is due for bone marrow biopsy at the end of the month at  [facility]. Continue supportive care  MDS (myelodysplastic syndrome) (HCC) She is fully engrafted.  She is due for bone marrow biopsy at the end of the month for further assessment  Bone marrow transplant status She is receiving tacrolimus for GVH prophylaxis. Per patient request, we have drawn a level and will communicate with [facility] once we have the test results  Pancytopenia, acquired (HCC) She is transfusion independent. She is mildly leukopenic today. I consulted with [facility].  She does not need G-CSF unless ANC less than 1000  Hypomagnesemia She has low magnesium likely due to diarrhea. I will replace magnesium intravenously  PICC (peripherally inserted central catheter) flush She has central line. 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 She has appointment to follow-up at the infectious disease clinic at [facility] for further management.  Bilateral leg edema She has bilateral leg edema likely due to water retention and related to low total protein. She has elastic compression hose at home and I recommend her wearing them regularly  Physical deconditioning She has generalized deconditioning. I will consult home PT     Orders Placed This  Encounter  Procedures  . Magnesium    Standing Status: Future     Number of Occurrences: 1     Standing Expiration Date: 01/09/2017   All questions were answered. The patient knows to call the clinic with any problems, questions or concerns. No barriers to learning was detected. I spent 40 minutes counseling the patient face to face. The total time spent in the appointment was 55 minutes and more than 50% was on counseling and review of test results     GORSUCH, NI, MD 01/10/2016 3:41 PM    

## 2016-01-10 NOTE — Patient Instructions (Signed)

## 2016-01-10 NOTE — Patient Instructions (Signed)
Hypomagnesemia °Hypomagnesemia is a condition in which the level of magnesium in the blood is low. Magnesium is a mineral that is found in many foods. It is used in many different processes in the body. Hypomagnesemia can affect every organ in the body. It can cause life-threatening problems. °CAUSES °Causes of hypomagnesemia include: °· Not getting enough magnesium in your diet. °· Malnutrition. °· Problems with absorbing magnesium from the intestines. °· Dehydration. °· Alcohol abuse. °· Vomiting. °· Severe diarrhea. °· Some medicines, including medicines that make you urinate more. °· Certain diseases, such as kidney disease, diabetes, and overactive thyroid. °SIGNS AND SYMPTOMS °· Involuntary shaking or trembling of a body part (tremor). °· Confusion. °· Muscle weakness. °· Sensitivity to light, sound, and touch. °· Psychiatric issues, such as depression, irritability, or psychosis. °· Sudden tightening of muscles (muscle spasms). °· Tingling in the arms and legs. °· A feeling of fluttering of the heart. °These symptoms are more severe if magnesium levels drop suddenly. °DIAGNOSIS °To make a diagnosis, your health care provider will do a physical exam and order blood and urine tests. °TREATMENT °Treatment will depend on the cause and the severity of your condition. It may involve: °· A magnesium supplement. This can be taken in pill form. It can also be given through an IV tube. This is usually done if the condition is severe. °· Changes to your diet. You may be directed to eat foods that have a lot of magnesium, such as green leafy vegetables, peas, beans, and nuts. °· Eliminating alcohol from your diet. °HOME CARE INSTRUCTIONS °· Include foods with magnesium in your diet. Foods that are rich in magnesium include green vegetables, beans, nuts and seeds, and whole grains. °· Take medicines only as directed by your health care provider. °· Take magnesium supplements if your health care provider instructs you to  do that. Take them as directed. °· Have your magnesium levels monitored as directed by your health care provider. °· When you are active, drink fluids that contain electrolytes. °· Keep all follow-up visits as directed by your health care provider. This is important. °SEEK MEDICAL CARE IF: °· You get worse instead of better. °· Your symptoms return. °SEEK IMMEDIATE MEDICAL CARE IF: °· Your symptoms are severe. °  °This information is not intended to replace advice given to you by your health care provider. Make sure you discuss any questions you have with your health care provider. °  °Document Released: 03/08/2005 Document Revised: 07/03/2014 Document Reviewed: 01/26/2014 °Elsevier Interactive Patient Education ©2016 [employer] ° °

## 2016-01-10 NOTE — Patient Instructions (Signed)

## 2016-01-10 NOTE — Telephone Encounter (Signed)
Added flush per MD, called and informed pt

## 2016-01-10 NOTE — Progress Notes (Signed)
Canceled Granix injection per Dr. Alvy Bimler order

## 2016-01-11 ENCOUNTER — Encounter: Payer: Self-pay | Admitting: Hematology and Oncology

## 2016-01-11 DIAGNOSIS — R938 Abnormal findings on diagnostic imaging of other specified body structures: Secondary | ICD-10-CM | POA: Diagnosis not present

## 2016-01-11 DIAGNOSIS — R5381 Other malaise: Secondary | ICD-10-CM | POA: Insufficient documentation

## 2016-01-11 DIAGNOSIS — B259 Cytomegaloviral disease, unspecified: Secondary | ICD-10-CM | POA: Diagnosis not present

## 2016-01-11 DIAGNOSIS — Z9484 Stem cells transplant status: Secondary | ICD-10-CM | POA: Diagnosis not present

## 2016-01-11 DIAGNOSIS — B998 Other infectious disease: Secondary | ICD-10-CM | POA: Diagnosis not present

## 2016-01-11 DIAGNOSIS — R600 Localized edema: Secondary | ICD-10-CM | POA: Insufficient documentation

## 2016-01-11 DIAGNOSIS — Z1159 Encounter for screening for other viral diseases: Secondary | ICD-10-CM | POA: Diagnosis not present

## 2016-01-11 NOTE — Assessment & Plan Note (Signed)
She has central line. I will get the nurses to flush central line today

## 2016-01-11 NOTE — Assessment & Plan Note (Signed)
She has generalized deconditioning. I will consult home PT

## 2016-01-11 NOTE — Assessment & Plan Note (Signed)
She has bilateral leg edema likely due to water retention and related to low total protein. She has elastic compression hose at home and I recommend her wearing them regularly

## 2016-01-11 NOTE — Assessment & Plan Note (Signed)
She has low magnesium likely due to diarrhea. I will replace magnesium intravenously

## 2016-01-11 NOTE — Assessment & Plan Note (Signed)
She is transfusion independent. She is mildly leukopenic today. I consulted with [place].  She does not need G-CSF unless ANC less than 1000

## 2016-01-11 NOTE — Assessment & Plan Note (Signed)
The patient is currently receiving active treatment with Valcyte. She has appointment to follow-up at the infectious disease clinic at [facility] for further management.

## 2016-01-11 NOTE — Assessment & Plan Note (Signed)
Her last bone marrow biopsy prior to bone marrow transplant showed near-complete remission. She is due for bone marrow biopsy at the end of the month at [facility]. Continue supportive care

## 2016-01-11 NOTE — Assessment & Plan Note (Addendum)
She is fully engrafted.  She is due for bone marrow biopsy at the end of the month for further assessment 

## 2016-01-11 NOTE — Assessment & Plan Note (Signed)
She is receiving tacrolimus for GVH prophylaxis. Per patient request, we have drawn a level and will communicate with [facility] once we have the test results

## 2016-01-12 ENCOUNTER — Telehealth: Payer: Self-pay | Admitting: *Deleted

## 2016-01-12 LAB — TACROLIMUS LEVEL: Tacrolimus (FK506), Blood: 6.2 ng/mL (ref 2.0–20.0)

## 2016-01-12 NOTE — Telephone Encounter (Signed)
-----   Message from [name], MD sent at 01/12/2016  7:57 AM EDT ----- Regarding: tacrolimus Pls send result to [facility] and call patient ----- Message -----    From: [facility]    Sent: 01/10/2016  12:54 PM      To: [name], MD

## 2016-01-12 NOTE — Telephone Encounter (Signed)
Pt notified of results.  Faxed to [facility]

## 2016-01-13 ENCOUNTER — Telehealth: Payer: Self-pay | Admitting: Cardiovascular Disease

## 2016-01-13 NOTE — Telephone Encounter (Signed)
Pt dropped off health information to the office asking for Dr. Tyrell Antonio opinion. S/w pt who is agreeable to f/u OV as she was last seen August 2016. Forward to scheduling to call pt for appt.

## 2016-01-14 ENCOUNTER — Telehealth: Payer: Self-pay | Admitting: *Deleted

## 2016-01-14 NOTE — Telephone Encounter (Signed)
Left message for BMT nurse to see if Dr Nila Nephew wants patient to be followed here at [facility]. Requested her to call us back.

## 2016-01-17 ENCOUNTER — Telehealth: Payer: Self-pay | Admitting: Cardiovascular Disease

## 2016-01-17 DIAGNOSIS — Z9484 Stem cells transplant status: Secondary | ICD-10-CM | POA: Diagnosis not present

## 2016-01-17 DIAGNOSIS — J90 Pleural effusion, not elsewhere classified: Secondary | ICD-10-CM | POA: Diagnosis not present

## 2016-01-17 DIAGNOSIS — D469 Myelodysplastic syndrome, unspecified: Secondary | ICD-10-CM | POA: Diagnosis not present

## 2016-01-17 DIAGNOSIS — I517 Cardiomegaly: Secondary | ICD-10-CM | POA: Diagnosis not present

## 2016-01-17 DIAGNOSIS — B259 Cytomegaloviral disease, unspecified: Secondary | ICD-10-CM | POA: Diagnosis not present

## 2016-01-17 DIAGNOSIS — C9000 Multiple myeloma not having achieved remission: Secondary | ICD-10-CM | POA: Diagnosis not present

## 2016-01-17 DIAGNOSIS — I313 Pericardial effusion (noninflammatory): Secondary | ICD-10-CM | POA: Diagnosis not present

## 2016-01-17 NOTE — Telephone Encounter (Signed)
Error

## 2016-01-21 ENCOUNTER — Encounter: Payer: Self-pay | Admitting: Hematology and Oncology

## 2016-01-24 ENCOUNTER — Other Ambulatory Visit: Payer: Self-pay | Admitting: *Deleted

## 2016-01-24 DIAGNOSIS — M810 Age-related osteoporosis without current pathological fracture: Secondary | ICD-10-CM | POA: Diagnosis not present

## 2016-01-24 DIAGNOSIS — Z539 Procedure and treatment not carried out, unspecified reason: Secondary | ICD-10-CM | POA: Diagnosis not present

## 2016-01-24 DIAGNOSIS — J90 Pleural effusion, not elsewhere classified: Secondary | ICD-10-CM | POA: Diagnosis not present

## 2016-01-24 DIAGNOSIS — D469 Myelodysplastic syndrome, unspecified: Secondary | ICD-10-CM

## 2016-01-24 DIAGNOSIS — Z87891 Personal history of nicotine dependence: Secondary | ICD-10-CM | POA: Diagnosis not present

## 2016-01-24 DIAGNOSIS — I313 Pericardial effusion (noninflammatory): Secondary | ICD-10-CM | POA: Insufficient documentation

## 2016-01-24 DIAGNOSIS — I3139 Other pericardial effusion (noninflammatory): Secondary | ICD-10-CM | POA: Insufficient documentation

## 2016-01-24 DIAGNOSIS — I10 Essential (primary) hypertension: Secondary | ICD-10-CM | POA: Diagnosis not present

## 2016-01-24 DIAGNOSIS — Z9484 Stem cells transplant status: Secondary | ICD-10-CM | POA: Diagnosis not present

## 2016-01-24 DIAGNOSIS — Z9481 Bone marrow transplant status: Secondary | ICD-10-CM | POA: Diagnosis not present

## 2016-01-24 DIAGNOSIS — R112 Nausea with vomiting, unspecified: Secondary | ICD-10-CM | POA: Diagnosis not present

## 2016-01-24 DIAGNOSIS — I309 Acute pericarditis, unspecified: Secondary | ICD-10-CM | POA: Diagnosis not present

## 2016-01-24 DIAGNOSIS — C9000 Multiple myeloma not having achieved remission: Secondary | ICD-10-CM | POA: Diagnosis not present

## 2016-01-25 ENCOUNTER — Encounter: Payer: Self-pay | Admitting: Hematology and Oncology

## 2016-01-25 DIAGNOSIS — I10 Essential (primary) hypertension: Secondary | ICD-10-CM | POA: Diagnosis not present

## 2016-01-25 DIAGNOSIS — J90 Pleural effusion, not elsewhere classified: Secondary | ICD-10-CM | POA: Insufficient documentation

## 2016-01-25 DIAGNOSIS — I309 Acute pericarditis, unspecified: Secondary | ICD-10-CM | POA: Diagnosis not present

## 2016-01-28 DIAGNOSIS — C9002 Multiple myeloma in relapse: Secondary | ICD-10-CM | POA: Diagnosis not present

## 2016-01-28 DIAGNOSIS — D61818 Other pancytopenia: Secondary | ICD-10-CM | POA: Diagnosis not present

## 2016-01-28 DIAGNOSIS — B258 Other cytomegaloviral diseases: Secondary | ICD-10-CM | POA: Diagnosis not present

## 2016-01-28 DIAGNOSIS — D469 Myelodysplastic syndrome, unspecified: Secondary | ICD-10-CM | POA: Diagnosis not present

## 2016-01-28 DIAGNOSIS — Z9481 Bone marrow transplant status: Secondary | ICD-10-CM | POA: Diagnosis not present

## 2016-01-31 DIAGNOSIS — I313 Pericardial effusion (noninflammatory): Secondary | ICD-10-CM | POA: Diagnosis not present

## 2016-01-31 DIAGNOSIS — R938 Abnormal findings on diagnostic imaging of other specified body structures: Secondary | ICD-10-CM | POA: Diagnosis not present

## 2016-01-31 DIAGNOSIS — C9201 Acute myeloblastic leukemia, in remission: Secondary | ICD-10-CM | POA: Diagnosis not present

## 2016-01-31 DIAGNOSIS — Z5181 Encounter for therapeutic drug level monitoring: Secondary | ICD-10-CM | POA: Diagnosis not present

## 2016-01-31 DIAGNOSIS — Z9481 Bone marrow transplant status: Secondary | ICD-10-CM | POA: Diagnosis not present

## 2016-01-31 DIAGNOSIS — Z48298 Encounter for aftercare following other organ transplant: Secondary | ICD-10-CM | POA: Diagnosis not present

## 2016-01-31 DIAGNOSIS — C9000 Multiple myeloma not having achieved remission: Secondary | ICD-10-CM | POA: Diagnosis not present

## 2016-01-31 DIAGNOSIS — Z79899 Other long term (current) drug therapy: Secondary | ICD-10-CM | POA: Diagnosis not present

## 2016-01-31 DIAGNOSIS — Z9484 Stem cells transplant status: Secondary | ICD-10-CM | POA: Diagnosis not present

## 2016-01-31 DIAGNOSIS — D469 Myelodysplastic syndrome, unspecified: Secondary | ICD-10-CM | POA: Diagnosis not present

## 2016-02-01 DIAGNOSIS — D61818 Other pancytopenia: Secondary | ICD-10-CM | POA: Diagnosis not present

## 2016-02-01 DIAGNOSIS — Z9481 Bone marrow transplant status: Secondary | ICD-10-CM | POA: Diagnosis not present

## 2016-02-01 DIAGNOSIS — D469 Myelodysplastic syndrome, unspecified: Secondary | ICD-10-CM | POA: Diagnosis not present

## 2016-02-01 DIAGNOSIS — B258 Other cytomegaloviral diseases: Secondary | ICD-10-CM | POA: Diagnosis not present

## 2016-02-01 DIAGNOSIS — C9002 Multiple myeloma in relapse: Secondary | ICD-10-CM | POA: Diagnosis not present

## 2016-02-03 DIAGNOSIS — D469 Myelodysplastic syndrome, unspecified: Secondary | ICD-10-CM | POA: Diagnosis not present

## 2016-02-03 DIAGNOSIS — B258 Other cytomegaloviral diseases: Secondary | ICD-10-CM | POA: Diagnosis not present

## 2016-02-03 DIAGNOSIS — D61818 Other pancytopenia: Secondary | ICD-10-CM | POA: Diagnosis not present

## 2016-02-03 DIAGNOSIS — C9002 Multiple myeloma in relapse: Secondary | ICD-10-CM | POA: Diagnosis not present

## 2016-02-03 DIAGNOSIS — Z9481 Bone marrow transplant status: Secondary | ICD-10-CM | POA: Diagnosis not present

## 2016-02-04 DIAGNOSIS — I313 Pericardial effusion (noninflammatory): Secondary | ICD-10-CM | POA: Diagnosis not present

## 2016-02-07 DIAGNOSIS — Z5181 Encounter for therapeutic drug level monitoring: Secondary | ICD-10-CM | POA: Diagnosis not present

## 2016-02-07 DIAGNOSIS — Z452 Encounter for adjustment and management of vascular access device: Secondary | ICD-10-CM | POA: Diagnosis not present

## 2016-02-07 DIAGNOSIS — C9000 Multiple myeloma not having achieved remission: Secondary | ICD-10-CM | POA: Diagnosis not present

## 2016-02-07 DIAGNOSIS — C9201 Acute myeloblastic leukemia, in remission: Secondary | ICD-10-CM | POA: Diagnosis not present

## 2016-02-07 DIAGNOSIS — Z9484 Stem cells transplant status: Secondary | ICD-10-CM | POA: Diagnosis not present

## 2016-02-07 DIAGNOSIS — D469 Myelodysplastic syndrome, unspecified: Secondary | ICD-10-CM | POA: Diagnosis not present

## 2016-02-08 DIAGNOSIS — C9002 Multiple myeloma in relapse: Secondary | ICD-10-CM | POA: Diagnosis not present

## 2016-02-08 DIAGNOSIS — D469 Myelodysplastic syndrome, unspecified: Secondary | ICD-10-CM | POA: Diagnosis not present

## 2016-02-08 DIAGNOSIS — Z9481 Bone marrow transplant status: Secondary | ICD-10-CM | POA: Diagnosis not present

## 2016-02-08 DIAGNOSIS — D61818 Other pancytopenia: Secondary | ICD-10-CM | POA: Diagnosis not present

## 2016-02-08 DIAGNOSIS — B258 Other cytomegaloviral diseases: Secondary | ICD-10-CM | POA: Diagnosis not present

## 2016-02-10 DIAGNOSIS — D469 Myelodysplastic syndrome, unspecified: Secondary | ICD-10-CM | POA: Diagnosis not present

## 2016-02-10 DIAGNOSIS — C9002 Multiple myeloma in relapse: Secondary | ICD-10-CM | POA: Diagnosis not present

## 2016-02-10 DIAGNOSIS — D61818 Other pancytopenia: Secondary | ICD-10-CM | POA: Diagnosis not present

## 2016-02-10 DIAGNOSIS — B258 Other cytomegaloviral diseases: Secondary | ICD-10-CM | POA: Diagnosis not present

## 2016-02-10 DIAGNOSIS — Z9481 Bone marrow transplant status: Secondary | ICD-10-CM | POA: Diagnosis not present

## 2016-02-14 ENCOUNTER — Telehealth: Payer: Self-pay | Admitting: *Deleted

## 2016-02-14 DIAGNOSIS — I313 Pericardial effusion (noninflammatory): Secondary | ICD-10-CM | POA: Diagnosis not present

## 2016-02-14 DIAGNOSIS — C9000 Multiple myeloma not having achieved remission: Secondary | ICD-10-CM | POA: Diagnosis not present

## 2016-02-14 DIAGNOSIS — Z9481 Bone marrow transplant status: Secondary | ICD-10-CM | POA: Diagnosis not present

## 2016-02-14 DIAGNOSIS — Z5181 Encounter for therapeutic drug level monitoring: Secondary | ICD-10-CM | POA: Diagnosis not present

## 2016-02-14 DIAGNOSIS — D479 Neoplasm of uncertain behavior of lymphoid, hematopoietic and related tissue, unspecified: Secondary | ICD-10-CM | POA: Diagnosis not present

## 2016-02-14 DIAGNOSIS — Z9484 Stem cells transplant status: Secondary | ICD-10-CM | POA: Diagnosis not present

## 2016-02-14 NOTE — Telephone Encounter (Signed)
FYI "Morey Hummingbird Minor NP at [facility] [phone]) calling to ensure Dr. Alvy Bimler is following this patient post transplant.  She is doing so well we are going to start spacing our appointments farther apart.  We will fax a copy of today's visit as well."  Seen last at [facility] on 01-10-2016.  No future F/U scheduled at this time-

## 2016-02-15 ENCOUNTER — Other Ambulatory Visit: Payer: Self-pay | Admitting: Hematology and Oncology

## 2016-02-15 DIAGNOSIS — B258 Other cytomegaloviral diseases: Secondary | ICD-10-CM | POA: Diagnosis not present

## 2016-02-15 DIAGNOSIS — Z9481 Bone marrow transplant status: Secondary | ICD-10-CM

## 2016-02-15 DIAGNOSIS — D469 Myelodysplastic syndrome, unspecified: Secondary | ICD-10-CM

## 2016-02-15 DIAGNOSIS — C9002 Multiple myeloma in relapse: Secondary | ICD-10-CM | POA: Diagnosis not present

## 2016-02-15 DIAGNOSIS — D61818 Other pancytopenia: Secondary | ICD-10-CM | POA: Diagnosis not present

## 2016-02-17 DIAGNOSIS — D469 Myelodysplastic syndrome, unspecified: Secondary | ICD-10-CM | POA: Diagnosis not present

## 2016-02-17 DIAGNOSIS — B258 Other cytomegaloviral diseases: Secondary | ICD-10-CM | POA: Diagnosis not present

## 2016-02-17 DIAGNOSIS — D61818 Other pancytopenia: Secondary | ICD-10-CM | POA: Diagnosis not present

## 2016-02-17 DIAGNOSIS — Z9481 Bone marrow transplant status: Secondary | ICD-10-CM | POA: Diagnosis not present

## 2016-02-17 DIAGNOSIS — C9002 Multiple myeloma in relapse: Secondary | ICD-10-CM | POA: Diagnosis not present

## 2016-02-18 ENCOUNTER — Encounter: Payer: Self-pay | Admitting: Hematology and Oncology

## 2016-02-18 DIAGNOSIS — Z9481 Bone marrow transplant status: Secondary | ICD-10-CM | POA: Diagnosis not present

## 2016-02-18 DIAGNOSIS — I313 Pericardial effusion (noninflammatory): Secondary | ICD-10-CM | POA: Diagnosis not present

## 2016-02-22 ENCOUNTER — Telehealth: Payer: Self-pay | Admitting: Hematology and Oncology

## 2016-02-22 ENCOUNTER — Ambulatory Visit: Payer: Medicare HMO | Admitting: Cardiovascular Disease

## 2016-02-22 ENCOUNTER — Other Ambulatory Visit (HOSPITAL_BASED_OUTPATIENT_CLINIC_OR_DEPARTMENT_OTHER): Payer: Medicare HMO

## 2016-02-22 ENCOUNTER — Other Ambulatory Visit: Payer: Self-pay | Admitting: Hematology and Oncology

## 2016-02-22 ENCOUNTER — Ambulatory Visit (HOSPITAL_BASED_OUTPATIENT_CLINIC_OR_DEPARTMENT_OTHER): Payer: Medicare HMO | Admitting: Hematology and Oncology

## 2016-02-22 VITALS — BP 132/72 | HR 92 | Temp 98.0°F | Resp 18 | Ht 63.0 in | Wt 125.5 lb

## 2016-02-22 DIAGNOSIS — Z9481 Bone marrow transplant status: Secondary | ICD-10-CM

## 2016-02-22 DIAGNOSIS — C9001 Multiple myeloma in remission: Secondary | ICD-10-CM | POA: Diagnosis not present

## 2016-02-22 DIAGNOSIS — R600 Localized edema: Secondary | ICD-10-CM

## 2016-02-22 DIAGNOSIS — D469 Myelodysplastic syndrome, unspecified: Secondary | ICD-10-CM

## 2016-02-22 DIAGNOSIS — B259 Cytomegaloviral disease, unspecified: Secondary | ICD-10-CM | POA: Diagnosis not present

## 2016-02-22 DIAGNOSIS — D61818 Other pancytopenia: Secondary | ICD-10-CM | POA: Diagnosis not present

## 2016-02-22 LAB — COMPREHENSIVE METABOLIC PANEL
ALT: 17 U/L (ref 0–55)
AST: 39 U/L — ABNORMAL HIGH (ref 5–34)
Albumin: 3.6 g/dL (ref 3.5–5.0)
Alkaline Phosphatase: 134 U/L (ref 40–150)
Anion Gap: 8 mEq/L (ref 3–11)
BUN: 19.6 mg/dL (ref 7.0–26.0)
CO2: 24 mEq/L (ref 22–29)
Calcium: 8.7 mg/dL (ref 8.4–10.4)
Chloride: 110 mEq/L — ABNORMAL HIGH (ref 98–109)
Creatinine: 1.2 mg/dL — ABNORMAL HIGH (ref 0.6–1.1)
EGFR: 45 mL/min/{1.73_m2} — ABNORMAL LOW (ref >90)
Glucose: 111 mg/dl (ref 70–140)
Potassium: 4.5 mEq/L (ref 3.5–5.1)
Sodium: 143 mEq/L (ref 136–145)
Total Bilirubin: 0.41 mg/dL (ref 0.20–1.20)
Total Protein: 5.8 g/dL — ABNORMAL LOW (ref 6.4–8.3)

## 2016-02-22 LAB — CBC WITH DIFFERENTIAL/PLATELET
BASO%: 0.8 % (ref 0.0–2.0)
Basophils Absolute: 0 10*3/uL (ref 0.0–0.1)
EOS%: 0.8 % (ref 0.0–7.0)
Eosinophils Absolute: 0 10*3/uL (ref 0.0–0.5)
HCT: 30.8 % — ABNORMAL LOW (ref 34.8–46.6)
HGB: 10.2 g/dL — ABNORMAL LOW (ref 11.6–15.9)
LYMPH%: 29.2 % (ref 14.0–49.7)
MCH: 34.9 pg — ABNORMAL HIGH (ref 25.1–34.0)
MCHC: 33.2 g/dL (ref 31.5–36.0)
MCV: 104.9 fL — ABNORMAL HIGH (ref 79.5–101.0)
MONO#: 0.3 10*3/uL (ref 0.1–0.9)
MONO%: 12.2 % (ref 0.0–14.0)
NEUT#: 1.5 10*3/uL (ref 1.5–6.5)
NEUT%: 57 % (ref 38.4–76.8)
Platelets: 129 10*3/uL — ABNORMAL LOW (ref 145–400)
RBC: 2.94 10*6/uL — ABNORMAL LOW (ref 3.70–5.45)
RDW: 13.2 % (ref 11.2–14.5)
WBC: 2.6 10*3/uL — ABNORMAL LOW (ref 3.9–10.3)
lymph#: 0.8 10*3/uL — ABNORMAL LOW (ref 0.9–3.3)

## 2016-02-22 LAB — TECHNOLOGIST REVIEW

## 2016-02-22 NOTE — Telephone Encounter (Signed)
Gave patient avs report and appointments for September.  °

## 2016-02-23 ENCOUNTER — Encounter: Payer: Self-pay | Admitting: Hematology and Oncology

## 2016-02-23 LAB — TACROLIMUS LEVEL: Tacrolimus (FK506), Blood: 10.8 ng/mL (ref 2.0–20.0)

## 2016-02-23 NOTE — Assessment & Plan Note (Signed)
She is receiving tacrolimus for GVH prophylaxis. Per patient request, we have drawn a level and will communicate with [facility] once we have the test results

## 2016-02-23 NOTE — Assessment & Plan Note (Signed)
She is transfusion independent. She is mildly leukopenic today. She does not need G-CSF unless ANC less than 1000 She does not need transfusion today. She is not symptomatic

## 2016-02-23 NOTE — Progress Notes (Signed)
[facility] OFFICE PROGRESS NOTE  Patient Care Team: Jinny Sanders, MD as PCP - General (Family Medicine) [name], MD as Referring Physician (Hematology and Oncology) [name], MD as Consulting Physician (Internal Medicine) Truman Hayward, MD as Consulting Physician (Infectious Diseases) Deanne Coffer An Nila Nephew, MD as Consulting Physician (Hematology and Oncology) [name], NP as Nurse Practitioner (Hematology and Oncology)  SUMMARY OF ONCOLOGIC HISTORY: Oncology History   Multiple myeloma, Ig A Lambda, M spike 3.54 grams, Calcium 9.2, Creatinine 0.8, Beta 2 microglobulin 4.52, IgA 4840 mg/dL, lambda light chain 75.4, albumin 3.6, hemoglobin 9.7, platelet 115    Primary site: Multiple Myeloma   Staging method: AJCC 6th Edition   Clinical: Stage IIA signed by [name], MD on 11/07/2013  2:46 PM   Summary: Stage IIA        Multiple myeloma in remission ([place])   10/31/2013 Bone Marrow Biopsy    Bone marrow biopsy confirmed multiple myeloma with 40% bone marrow involvement. Skeletal survey showed minimal lesions in her score with generalized demineralization      11/10/2013 - 02/13/2014 Chemotherapy    The patient is started on induction chemotherapy with weekly dexamethasone 40 mg by mouth as well as Velcade subcutaneous injection on days 1, 4, 8 and 11. On 11/21/2013, she was started on monthly Zometa.      12/23/2013 Adverse Reaction    The dose of Velcade was reduced due to thrombocytopenia.      01/28/2014 - 04/07/2014 Chemotherapy    Revlimid is added. Treatment was discontinued due to lack of response.      02/24/2014 - 04/07/2014 Chemotherapy    Due to worsening peripheral neuropathy, Velcade injection is changed to once a week. Revlimid was given 21 days on, 7 days off.      04/07/2014 - 04/10/2014 Chemotherapy    Revlimid was discontinued due to lack of response. Chemotherapy was changed back to Velcade injection twice a  week, 2 weeks on 1 week off. Her treatment was switched to to minimum response      04/20/2014 - 06/02/2014 Chemotherapy    chemotherapy is switched to Carfilzomib, Cytoxan and dexamethasone.      04/22/2014 Procedure    she has placement of port for chemotherapy.      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 [facility]      10/20/2014 - 03/24/2015 Chemotherapy     she received chemotherapy with Kyprolis, Revlimid and dexamethasone      10/22/2014 Procedure    She has port placement      01/19/2015 Tumor Marker    IgA lambda M spike at 0.4 g       01/20/2015 Miscellaneous    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[place])   04/06/2015 Bone Marrow Biopsy    Accession: [id number] BM biopsy showed RAEB-1      04/06/2015 Tumor Marker    Cytogenetics and FISH for MDS are within normal limits       10/06/2015 - 10/10/2015 Chemotherapy    She received conditioning chemotherapy with busulfan and melphalan      10/12/2015 Bone Marrow Transplant    She received allogenic stem cell transplant      10/19/2015 Adverse Reaction    She developed posttransplant complication with mucositis, viral infection with rhinovirus, neutropenic fever, bilateral pleural effusion and moderate pericardial effusion and CMV reactivation.      10/31/2015 Miscellaneous    She has engrafted       INTERVAL HISTORY: Please see below for problem oriented charting. She returns for follow-up. She denies recent infection. She continues to have bilateral lower extremity edema. She is doing home physical therapy. She denies recent diarrhea. Appetite is stable although she has lost some weight recently. The patient denies any recent signs or symptoms of bleeding such as spontaneous epistaxis, hematuria or hematochezia. She denies pain  REVIEW OF SYSTEMS:   Constitutional: Denies fevers, chills  Eyes: Denies blurriness of vision Ears, nose, mouth, throat, and face: Denies mucositis or sore throat Respiratory: Denies cough, dyspnea or wheezes Cardiovascular: Denies palpitation, chest discomfort or lower extremity swelling Gastrointestinal:  Denies nausea, heartburn or change in bowel habits Skin: Denies abnormal skin rashes Lymphatics: Denies new lymphadenopathy or easy bruising Neurological:Denies numbness, tingling or new weaknesses Behavioral/Psych: Mood is stable, no new changes  All other systems were reviewed with the patient and are negative.  I have reviewed the past medical history, past surgical history, social history and family history with the patient and they are unchanged from previous note.  ALLERGIES:  is allergic to penicillins and sulfa antibiotics.  MEDICATIONS:  Current Outpatient Prescriptions  Medication Sig Dispense Refill  . acetaminophen (TYLENOL) 500 MG tablet Take 1,500 mg by mouth  as needed for moderate pain, fever or headache.     Marland Kitchen acyclovir (ZOVIRAX) 400 MG tablet TAKE 1 TABLET (400 MG TOTAL) BY MOUTH 2 (TWO) TIMES DAILY. 180 tablet 3  . allopurinol (ZYLOPRIM) 300 MG tablet Take 300 mg by mouth daily.    Marland Kitchen ALPRAZolam (XANAX) 0.5 MG tablet Take 1 tablet (0.5 mg total) by mouth 2 (two) times daily as needed for anxiety. 60 tablet 0  . calcium carbonate (TUMS EX) 750 MG chewable tablet Chew 1 tablet by mouth 3 (three) times daily as needed for heartburn.     . carvedilol (COREG) 6.25 MG tablet Take 0.5 tablets (3.125 mg total) by mouth 2 (two) times daily with a meal. 90 tablet 3  . cholecalciferol (VITAMIN D) 1000 UNITS tablet Take 1,000 Units by mouth daily.    . daratumumab 16 mg/kg in sodium chloride 0.9 % Inject 1,100 mg into the vein once.    Marland Kitchen  docusate sodium (COLACE) 100 MG capsule Take 100 mg by mouth 2 (two) times daily as needed for mild constipation or moderate constipation.     . lidocaine-prilocaine (EMLA) cream Apply 1 application topically as needed.    . loperamide (IMODIUM) 2 MG capsule Take by mouth as needed for diarrhea or loose stools.    . mirtazapine (REMERON) 30 MG tablet Take 1 tablet (30 mg total) by mouth at bedtime. 90 tablet 3  . Multiple Vitamin (MULTIVITAMIN WITH MINERALS) TABS tablet Take 1 tablet by mouth daily.    . predniSONE (DELTASONE) 10 MG tablet Take 10 mg by mouth as directed. Take for two days after treatment  0  . sodium chloride (OCEAN) 0.65 % SOLN nasal spray Place 1 spray into both nostrils as needed for congestion.  0   No current facility-administered medications for this visit.     PHYSICAL EXAMINATION: ECOG PERFORMANCE STATUS: 1 - Symptomatic but completely ambulatory  Vitals:   02/22/16 1422  BP: 132/72  Pulse: 92  Resp: 18  Temp: 98 F (36.7 C)   Filed Weights   02/22/16 1422  Weight: 125 lb 8 oz (56.9 kg)    GENERAL:alert, no distress and comfortable SKIN: skin color, texture, turgor are normal, no  rashes or significant lesions EYES: normal, Conjunctiva are pink and non-injected, sclera clear OROPHARYNX:no exudate, no erythema and lips, buccal mucosa, and tongue normal  NECK: supple, thyroid normal size, non-tender, without nodularity LYMPH:  no palpable lymphadenopathy in the cervical, axillary or inguinal LUNGS: clear to auscultation and percussion with normal breathing effort HEART: regular rate & rhythm and no murmurs with moderate bilateral lower extremity edema ABDOMEN:abdomen soft, non-tender and normal bowel sounds Musculoskeletal:no cyanosis of digits and no clubbing  NEURO: alert & oriented x 3 with fluent speech, no focal motor/sensory deficits  LABORATORY DATA:  I have reviewed the data as listed    Component Value Date/Time   NA 143 02/22/2016 1412   K 4.5 02/22/2016 1412   CL 106 08/06/2015 0530   CO2 24 02/22/2016 1412   GLUCOSE 111 02/22/2016 1412   BUN 19.6 02/22/2016 1412   CREATININE 1.2 (H) 02/22/2016 1412   CALCIUM 8.7 02/22/2016 1412   PROT 5.8 (L) 02/22/2016 1412   ALBUMIN 3.6 02/22/2016 1412   AST 39 (H) 02/22/2016 1412   ALT 17 02/22/2016 1412   ALKPHOS 134 02/22/2016 1412   BILITOT 0.41 02/22/2016 1412   GFRNONAA >60 08/06/2015 0530   GFRAA >60 08/06/2015 0530    No results found for: SPEP, UPEP  Lab Results  Component Value Date   WBC 2.6 (L) 02/22/2016   NEUTROABS 1.5 02/22/2016   HGB 10.2 (L) 02/22/2016   HCT 30.8 (L) 02/22/2016   MCV 104.9 (H) 02/22/2016   PLT 129 (L) 02/22/2016      Chemistry      Component Value Date/Time   NA 143 02/22/2016 1412   K 4.5 02/22/2016 1412   CL 106 08/06/2015 0530   CO2 24 02/22/2016 1412   BUN 19.6 02/22/2016 1412   CREATININE 1.2 (H) 02/22/2016 1412      Component Value Date/Time   CALCIUM 8.7 02/22/2016 1412   ALKPHOS 134 02/22/2016 1412   AST 39 (H) 02/22/2016 1412   ALT 17 02/22/2016 1412   BILITOT 0.41 02/22/2016 1412      ASSESSMENT & PLAN:  MDS (myelodysplastic syndrome)  (HCC) She is fully engrafted.  She is due for bone marrow biopsy at the end of  the month for further assessment  Pancytopenia, acquired [facility]) She is transfusion independent. She is mildly leukopenic today. She does not need G-CSF unless ANC less than 1000 She does not need transfusion today. 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 Per patient request, we have drawn a level and will communicate with [facility] once we have the test results  Bilateral leg edema She has bilateral leg edema likely due to water retention and related to low total protein. 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   Orders Placed This Encounter  Procedures  . Comprehensive metabolic panel    Standing Status:   Future    Standing Expiration Date:   03/28/2017  . Magnesium    Standing Status:   Future    Standing Expiration Date:   03/28/2017  . Tacrolimus level    Standing Status:   Future    Standing Expiration Date:   03/28/2017   All questions were answered. The patient knows to call the clinic with any problems, questions or concerns. No barriers to learning was detected. I spent 25 minutes counseling the patient face to face. The total time spent in the appointment was 30 minutes and more than 50% was on counseling and review of test results     [facility], [place], MD 02/23/2016 3:16 PM

## 2016-02-23 NOTE — Assessment & Plan Note (Signed)
The patient is currently receiving active treatment with acyclovir She has appointment to follow-up at the infectious disease clinic at [facility] for further management.

## 2016-02-23 NOTE — Assessment & Plan Note (Signed)
She has bilateral leg edema likely due to water retention and related to low total protein. She has elastic compression hose at home and I recommend her wearing them regularly

## 2016-02-23 NOTE — Assessment & Plan Note (Signed)
She is fully engrafted.  She is due for bone marrow biopsy at the end of the month for further assessment 

## 2016-02-24 ENCOUNTER — Telehealth: Payer: Self-pay | Admitting: *Deleted

## 2016-02-24 NOTE — Telephone Encounter (Signed)
-----   Message from [name], MD sent at 02/24/2016  7:14 AM EDT ----- Regarding: tacrolimus Pls fax results to [facility] Please let Miss Sabra Heck her tacrolimus level seems OK ----- Message ----- From: Interface, Lab In Three Zero One Sent: 02/22/2016   2:21 PM To: [name], MD

## 2016-02-24 NOTE — Telephone Encounter (Signed)
Informed pt of Tacrolimus level wnl per Dr. Alvy Bimler.  She verbalized understanding.

## 2016-02-25 ENCOUNTER — Encounter: Payer: Self-pay | Admitting: *Deleted

## 2016-03-02 DIAGNOSIS — Z5181 Encounter for therapeutic drug level monitoring: Secondary | ICD-10-CM | POA: Diagnosis not present

## 2016-03-02 DIAGNOSIS — R69 Illness, unspecified: Secondary | ICD-10-CM | POA: Diagnosis not present

## 2016-03-02 DIAGNOSIS — D479 Neoplasm of uncertain behavior of lymphoid, hematopoietic and related tissue, unspecified: Secondary | ICD-10-CM | POA: Diagnosis not present

## 2016-03-02 DIAGNOSIS — C9000 Multiple myeloma not having achieved remission: Secondary | ICD-10-CM | POA: Diagnosis not present

## 2016-03-02 DIAGNOSIS — Z9481 Bone marrow transplant status: Secondary | ICD-10-CM | POA: Diagnosis not present

## 2016-03-02 DIAGNOSIS — C9001 Multiple myeloma in remission: Secondary | ICD-10-CM | POA: Diagnosis not present

## 2016-03-02 DIAGNOSIS — R110 Nausea: Secondary | ICD-10-CM | POA: Diagnosis not present

## 2016-03-02 DIAGNOSIS — D469 Myelodysplastic syndrome, unspecified: Secondary | ICD-10-CM | POA: Diagnosis not present

## 2016-03-03 ENCOUNTER — Encounter: Payer: Self-pay | Admitting: Hematology and Oncology

## 2016-03-03 DIAGNOSIS — I313 Pericardial effusion (noninflammatory): Secondary | ICD-10-CM | POA: Diagnosis not present

## 2016-03-04 IMAGING — CR DG CHEST 2V
2 series · 2 of 2 positions shown · non-contrast
Comparison: 10/02/2013

CLINICAL DATA: Cough and congestion beginning yesterday.

EXAM:
CHEST  2 VIEW

[w chest pa]
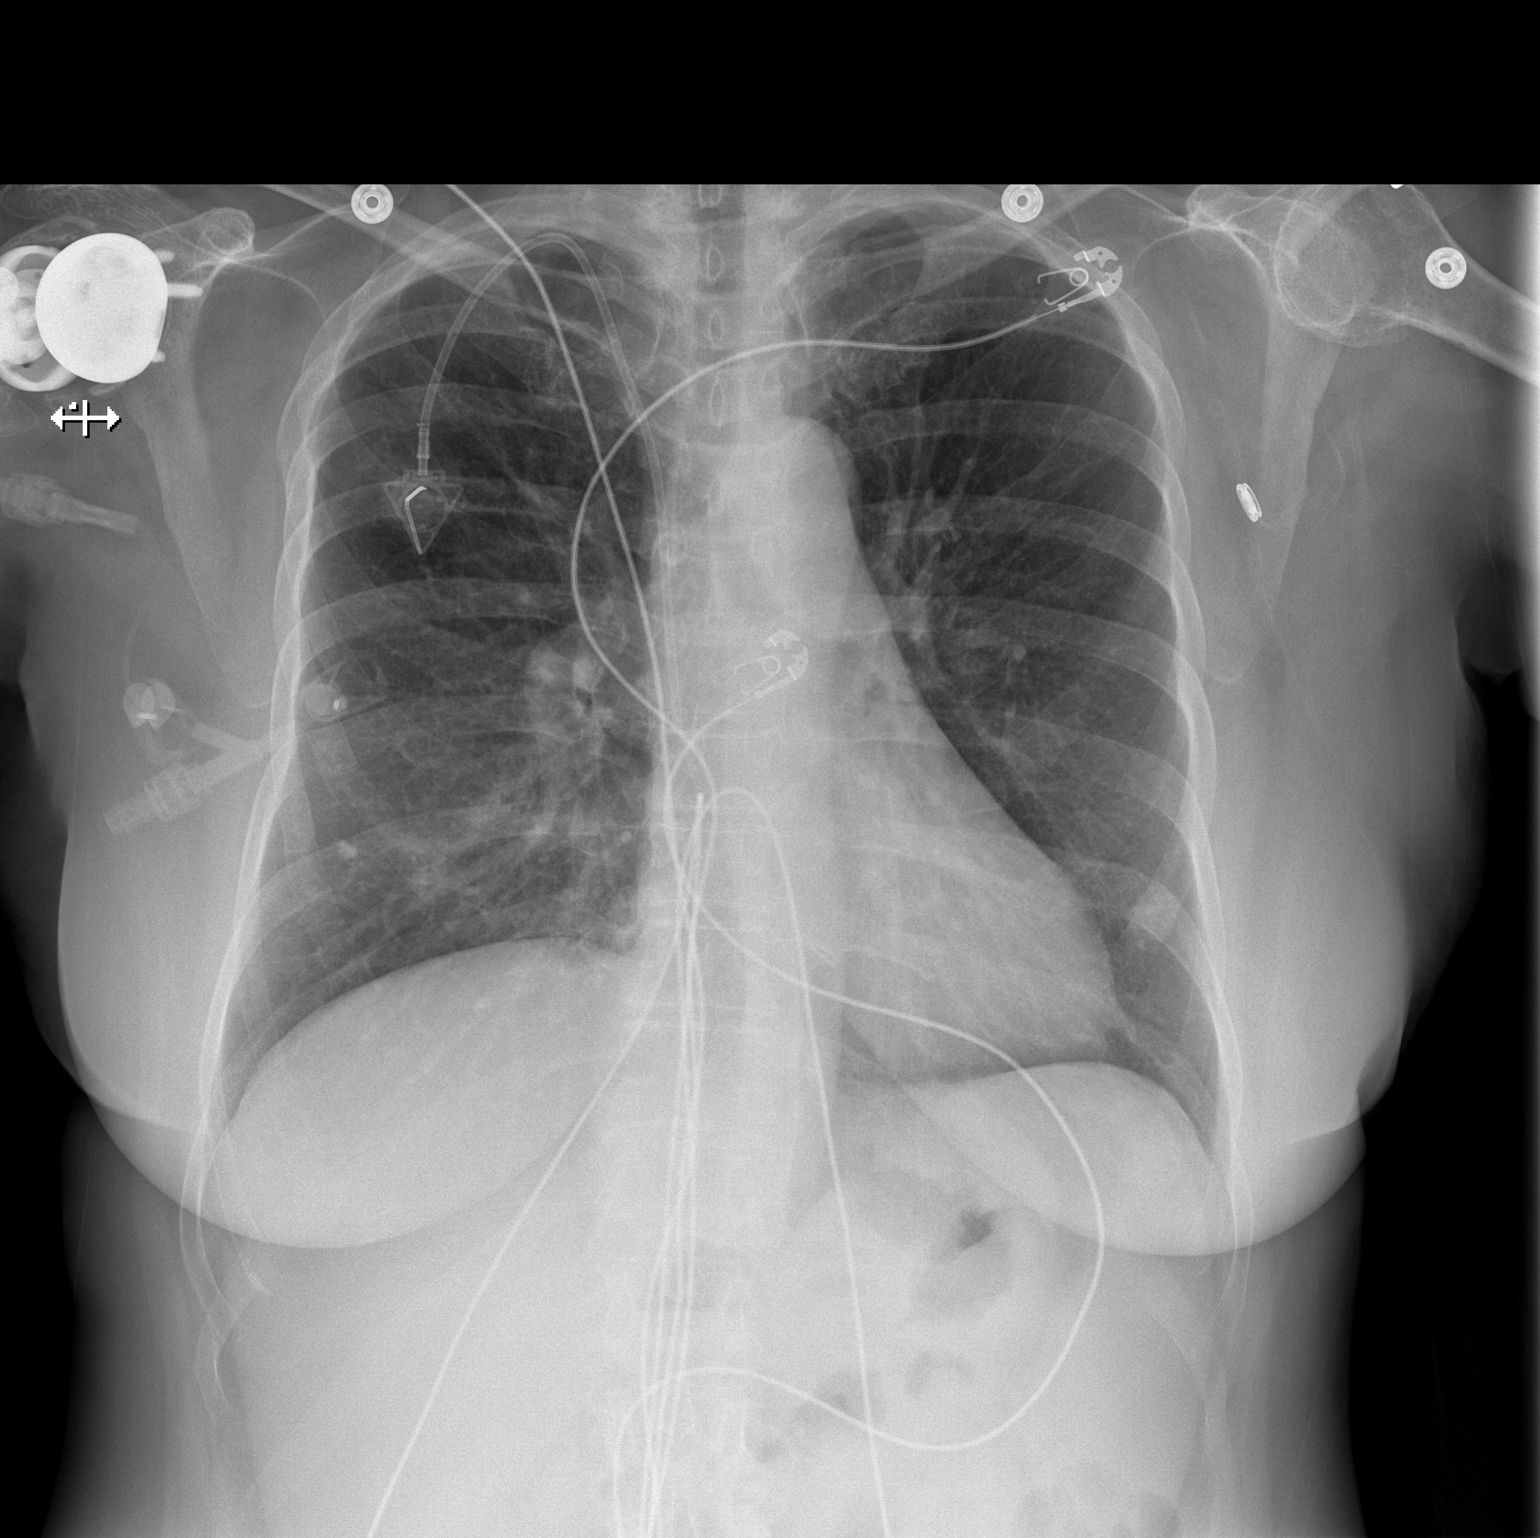

[w chest lat]
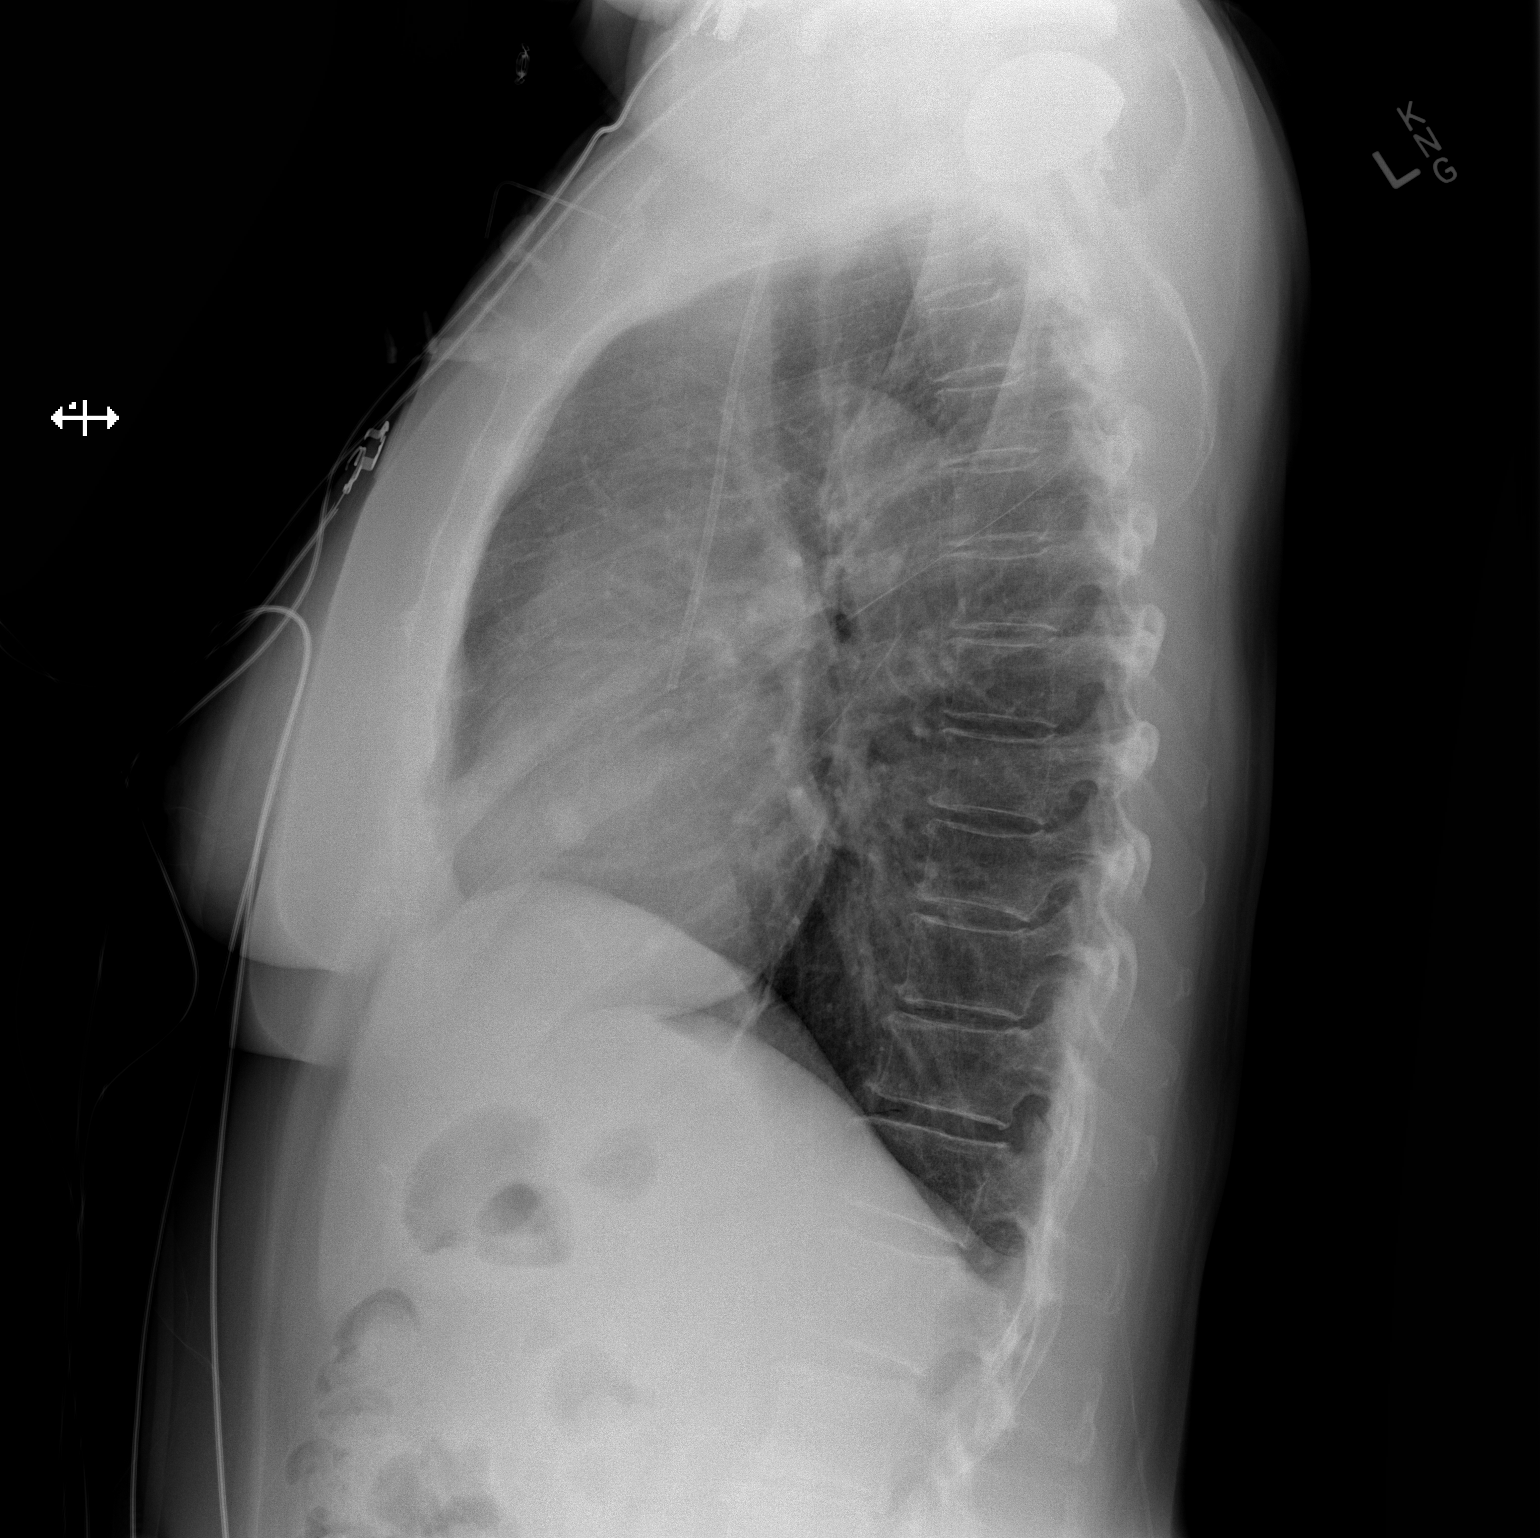

[2 of 2 positions shown; findings below may reference images not displayed]

FINDINGS: Power port is in place on the right with its tip at the SVC RA
junction. Heart size is normal. Mediastinal shadows are normal. The
lungs are clear except for calcified granuloma at the right base. No
acute bone finding. Previous shoulder arthroplasty on the right.
IMPRESSION: No active disease.

## 2016-03-08 ENCOUNTER — Telehealth: Payer: Self-pay | Admitting: Hematology and Oncology

## 2016-03-08 NOTE — Telephone Encounter (Signed)
Spoke with pt to confirm r/s appt to 10/2 per LOS

## 2016-03-14 ENCOUNTER — Encounter: Payer: Self-pay | Admitting: Hematology and Oncology

## 2016-03-20 DIAGNOSIS — C9000 Multiple myeloma not having achieved remission: Secondary | ICD-10-CM | POA: Diagnosis not present

## 2016-03-20 DIAGNOSIS — D479 Neoplasm of uncertain behavior of lymphoid, hematopoietic and related tissue, unspecified: Secondary | ICD-10-CM | POA: Diagnosis not present

## 2016-03-21 ENCOUNTER — Other Ambulatory Visit: Payer: Medicare HMO

## 2016-03-21 ENCOUNTER — Ambulatory Visit: Payer: Medicare HMO | Admitting: Hematology and Oncology

## 2016-03-21 DIAGNOSIS — D801 Nonfamilial hypogammaglobulinemia: Secondary | ICD-10-CM | POA: Insufficient documentation

## 2016-03-24 DIAGNOSIS — B259 Cytomegaloviral disease, unspecified: Secondary | ICD-10-CM | POA: Diagnosis not present

## 2016-03-27 ENCOUNTER — Ambulatory Visit: Payer: Medicare HMO | Admitting: Hematology and Oncology

## 2016-03-27 ENCOUNTER — Other Ambulatory Visit: Payer: Medicare HMO

## 2016-03-27 DIAGNOSIS — Z96611 Presence of right artificial shoulder joint: Secondary | ICD-10-CM | POA: Diagnosis not present

## 2016-03-27 DIAGNOSIS — Z471 Aftercare following joint replacement surgery: Secondary | ICD-10-CM | POA: Diagnosis not present

## 2016-03-27 DIAGNOSIS — M25511 Pain in right shoulder: Secondary | ICD-10-CM | POA: Diagnosis not present

## 2016-03-29 DIAGNOSIS — C9000 Multiple myeloma not having achieved remission: Secondary | ICD-10-CM | POA: Diagnosis not present

## 2016-03-29 DIAGNOSIS — D469 Myelodysplastic syndrome, unspecified: Secondary | ICD-10-CM | POA: Diagnosis not present

## 2016-03-29 DIAGNOSIS — Z9481 Bone marrow transplant status: Secondary | ICD-10-CM | POA: Diagnosis not present

## 2016-03-29 DIAGNOSIS — C9001 Multiple myeloma in remission: Secondary | ICD-10-CM | POA: Diagnosis not present

## 2016-03-29 DIAGNOSIS — D801 Nonfamilial hypogammaglobulinemia: Secondary | ICD-10-CM | POA: Diagnosis not present

## 2016-03-31 DIAGNOSIS — D469 Myelodysplastic syndrome, unspecified: Secondary | ICD-10-CM | POA: Diagnosis not present

## 2016-04-03 DIAGNOSIS — I313 Pericardial effusion (noninflammatory): Secondary | ICD-10-CM | POA: Diagnosis not present

## 2016-04-03 DIAGNOSIS — Z9481 Bone marrow transplant status: Secondary | ICD-10-CM | POA: Diagnosis not present

## 2016-04-03 DIAGNOSIS — D469 Myelodysplastic syndrome, unspecified: Secondary | ICD-10-CM | POA: Diagnosis not present

## 2016-04-03 DIAGNOSIS — Z5181 Encounter for therapeutic drug level monitoring: Secondary | ICD-10-CM | POA: Diagnosis not present

## 2016-04-03 DIAGNOSIS — D801 Nonfamilial hypogammaglobulinemia: Secondary | ICD-10-CM | POA: Diagnosis not present

## 2016-04-03 DIAGNOSIS — C9001 Multiple myeloma in remission: Secondary | ICD-10-CM | POA: Diagnosis not present

## 2016-04-07 DIAGNOSIS — I313 Pericardial effusion (noninflammatory): Secondary | ICD-10-CM | POA: Diagnosis not present

## 2016-04-07 DIAGNOSIS — Z950 Presence of cardiac pacemaker: Secondary | ICD-10-CM | POA: Diagnosis not present

## 2016-04-07 DIAGNOSIS — Z452 Encounter for adjustment and management of vascular access device: Secondary | ICD-10-CM | POA: Diagnosis not present

## 2016-04-07 DIAGNOSIS — J90 Pleural effusion, not elsewhere classified: Secondary | ICD-10-CM | POA: Diagnosis not present

## 2016-04-09 ENCOUNTER — Encounter: Payer: Self-pay | Admitting: Hematology and Oncology

## 2016-04-10 ENCOUNTER — Other Ambulatory Visit: Payer: Self-pay | Admitting: Hematology and Oncology

## 2016-04-10 DIAGNOSIS — Z9481 Bone marrow transplant status: Secondary | ICD-10-CM

## 2016-04-10 DIAGNOSIS — C9000 Multiple myeloma not having achieved remission: Secondary | ICD-10-CM | POA: Diagnosis not present

## 2016-04-10 DIAGNOSIS — B259 Cytomegaloviral disease, unspecified: Secondary | ICD-10-CM | POA: Diagnosis not present

## 2016-04-10 DIAGNOSIS — D469 Myelodysplastic syndrome, unspecified: Secondary | ICD-10-CM

## 2016-04-10 DIAGNOSIS — C9001 Multiple myeloma in remission: Secondary | ICD-10-CM

## 2016-04-12 ENCOUNTER — Telehealth: Payer: Self-pay | Admitting: *Deleted

## 2016-04-12 ENCOUNTER — Encounter: Payer: Self-pay | Admitting: Hematology and Oncology

## 2016-04-12 NOTE — Telephone Encounter (Signed)
Pt notified of Monday's appts. 1:30 lab, 2:00 Dr Alvy Bimler  Msg to scheduler

## 2016-04-13 ENCOUNTER — Other Ambulatory Visit: Payer: Self-pay | Admitting: Hematology and Oncology

## 2016-04-13 NOTE — Assessment & Plan Note (Signed)
Her instructions from her hematologist at [facility], she will continue tacrolimus for GVHD prophylaxis She will also receive dapsone, acyclovir and voriconazole for antimicrobial prophylaxis.

## 2016-04-13 NOTE — Assessment & Plan Note (Addendum)
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 Recent bone marrow biopsy showed no residual blasts. Continue supportive care

## 2016-04-13 NOTE — Assessment & Plan Note (Addendum)
I review her blood work from [facility] dated 04/10/2016. 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

## 2016-04-17 ENCOUNTER — Other Ambulatory Visit (HOSPITAL_BASED_OUTPATIENT_CLINIC_OR_DEPARTMENT_OTHER): Payer: Medicare HMO

## 2016-04-17 ENCOUNTER — Encounter: Payer: Self-pay | Admitting: Hematology and Oncology

## 2016-04-17 ENCOUNTER — Ambulatory Visit (HOSPITAL_BASED_OUTPATIENT_CLINIC_OR_DEPARTMENT_OTHER): Payer: Medicare HMO | Admitting: Hematology and Oncology

## 2016-04-17 ENCOUNTER — Telehealth: Payer: Self-pay | Admitting: Hematology and Oncology

## 2016-04-17 DIAGNOSIS — B259 Cytomegaloviral disease, unspecified: Secondary | ICD-10-CM | POA: Diagnosis not present

## 2016-04-17 DIAGNOSIS — Z9481 Bone marrow transplant status: Secondary | ICD-10-CM

## 2016-04-17 DIAGNOSIS — J069 Acute upper respiratory infection, unspecified: Secondary | ICD-10-CM

## 2016-04-17 DIAGNOSIS — C9001 Multiple myeloma in remission: Secondary | ICD-10-CM | POA: Diagnosis not present

## 2016-04-17 DIAGNOSIS — R600 Localized edema: Secondary | ICD-10-CM

## 2016-04-17 DIAGNOSIS — D61818 Other pancytopenia: Secondary | ICD-10-CM

## 2016-04-17 DIAGNOSIS — D469 Myelodysplastic syndrome, unspecified: Secondary | ICD-10-CM

## 2016-04-17 DIAGNOSIS — D801 Nonfamilial hypogammaglobulinemia: Secondary | ICD-10-CM | POA: Diagnosis not present

## 2016-04-17 LAB — CBC WITH DIFFERENTIAL/PLATELET
BASO%: 0.2 % (ref 0.0–2.0)
Basophils Absolute: 0 10*3/uL (ref 0.0–0.1)
EOS%: 0.3 % (ref 0.0–7.0)
Eosinophils Absolute: 0 10*3/uL (ref 0.0–0.5)
HCT: 26.6 % — ABNORMAL LOW (ref 34.8–46.6)
HGB: 8.9 g/dL — ABNORMAL LOW (ref 11.6–15.9)
LYMPH%: 23.6 % (ref 14.0–49.7)
MCH: 36.2 pg — ABNORMAL HIGH (ref 25.1–34.0)
MCHC: 33.4 g/dL (ref 31.5–36.0)
MCV: 108.3 fL — ABNORMAL HIGH (ref 79.5–101.0)
MONO#: 0.6 10*3/uL (ref 0.1–0.9)
MONO%: 12.1 % (ref 0.0–14.0)
NEUT#: 3.1 10*3/uL (ref 1.5–6.5)
NEUT%: 63.8 % (ref 38.4–76.8)
Platelets: 131 10*3/uL — ABNORMAL LOW (ref 145–400)
RBC: 2.46 10*6/uL — ABNORMAL LOW (ref 3.70–5.45)
RDW: 15.7 % — ABNORMAL HIGH (ref 11.2–14.5)
WBC: 4.9 10*3/uL (ref 3.9–10.3)
lymph#: 1.1 10*3/uL (ref 0.9–3.3)

## 2016-04-17 LAB — COMPREHENSIVE METABOLIC PANEL
ALT: <6 U/L (ref 0–55)
AST: 16 U/L (ref 5–34)
Albumin: 3.4 g/dL — ABNORMAL LOW (ref 3.5–5.0)
Alkaline Phosphatase: 168 U/L — ABNORMAL HIGH (ref 40–150)
Anion Gap: 8 mEq/L (ref 3–11)
BUN: 16.5 mg/dL (ref 7.0–26.0)
CO2: 24 mEq/L (ref 22–29)
Calcium: 8.3 mg/dL — ABNORMAL LOW (ref 8.4–10.4)
Chloride: 108 mEq/L (ref 98–109)
Creatinine: 0.8 mg/dL (ref 0.6–1.1)
EGFR: 75 mL/min/{1.73_m2} — ABNORMAL LOW (ref >90)
Glucose: 90 mg/dl (ref 70–140)
Potassium: 4.8 mEq/L (ref 3.5–5.1)
Sodium: 140 mEq/L (ref 136–145)
Total Bilirubin: 0.42 mg/dL (ref 0.20–1.20)
Total Protein: 6.1 g/dL — ABNORMAL LOW (ref 6.4–8.3)

## 2016-04-17 LAB — MAGNESIUM: Magnesium: 1.8 mg/dl (ref 1.5–2.5)

## 2016-04-17 NOTE — Assessment & Plan Note (Signed)
She is transfusion independent. She is mildly pancytopenic today. She does not need G-CSF unless ANC less than 1000 She does not need transfusion today. She is not symptomatic

## 2016-04-17 NOTE — Assessment & Plan Note (Signed)
She has received IVIG recently

## 2016-04-17 NOTE — Assessment & Plan Note (Signed)
She has bilateral leg edema likely due to water retention and related to low total protein. She has elastic compression hose at home and I recommend her wearing them regularly

## 2016-04-17 NOTE — Assessment & Plan Note (Signed)
The patient have history of CMV viremia. She is not symptomatic and she will continue current antiviral treatment as directed by her transplant physician. I have ordered blood work to check the PCR as requested.

## 2016-04-17 NOTE — Assessment & Plan Note (Signed)
She has symptoms of a recent cold. CBC show no evidence of leukocytosis. Overall, with conservative management using over-the-counter cough drops, Mucinex and Advil, she is improving. I would not recommend antibiotic therapy for now.

## 2016-04-17 NOTE — Progress Notes (Signed)
[facility] OFFICE PROGRESS NOTE  Patient Care Team: Jinny Sanders, MD as PCP - General (Family Medicine) [name], MD as Referring Physician (Hematology and Oncology) [name], MD as Consulting Physician (Internal Medicine) Truman Hayward, MD as Consulting Physician (Infectious Diseases) Deanne Coffer An Nila Nephew, MD as Consulting Physician (Hematology and Oncology) [name], NP as Nurse Practitioner (Hematology and Oncology)  SUMMARY OF ONCOLOGIC HISTORY: Oncology History   Multiple myeloma, Ig A Lambda, M spike 3.54 grams, Calcium 9.2, Creatinine 0.8, Beta 2 microglobulin 4.52, IgA 4840 mg/dL, lambda light chain 75.4, albumin 3.6, hemoglobin 9.7, platelet 115    Primary site: Multiple Myeloma   Staging method: AJCC 6th Edition   Clinical: Stage IIA signed by [name], MD on 11/07/2013  2:46 PM   Summary: Stage IIA        Multiple myeloma in remission ([place])   10/31/2013 Bone Marrow Biopsy    Bone marrow biopsy confirmed multiple myeloma with 40% bone marrow involvement. Skeletal survey showed minimal lesions in her score with generalized demineralization      11/10/2013 - 02/13/2014 Chemotherapy    The patient is started on induction chemotherapy with weekly dexamethasone 40 mg by mouth as well as Velcade subcutaneous injection on days 1, 4, 8 and 11. On 11/21/2013, she was started on monthly Zometa.      12/23/2013 Adverse Reaction    The dose of Velcade was reduced due to thrombocytopenia.      01/28/2014 - 04/07/2014 Chemotherapy    Revlimid is added. Treatment was discontinued due to lack of response.      02/24/2014 - 04/07/2014 Chemotherapy    Due to worsening peripheral neuropathy, Velcade injection is changed to once a week. Revlimid was given 21 days on, 7 days off.      04/07/2014 - 04/10/2014 Chemotherapy    Revlimid was discontinued due to lack of response. Chemotherapy was changed back to Velcade injection twice a  week, 2 weeks on 1 week off. Her treatment was switched to to minimum response      04/20/2014 - 06/02/2014 Chemotherapy    chemotherapy is switched to Carfilzomib, Cytoxan and dexamethasone.      04/22/2014 Procedure    she has placement of port for chemotherapy.      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 [facility]      10/20/2014 - 03/24/2015 Chemotherapy     she received chemotherapy with Kyprolis, Revlimid and dexamethasone      10/22/2014 Procedure    She has port placement      01/19/2015 Tumor Marker    IgA lambda M spike at 0.4 g       01/20/2015 Miscellaneous    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 No cource was found and fever resolved with IV vancomycin and meropenem      09/13/2015 Bone Marrow Biopsy    Bone marrow biopsy showed no increased blasts, 3-4 % plasma cells      03/02/2016 Bone Marrow Biopsy    Bone marrow biopsy at [facility] showed normocellular (30%) bone marrow with trilineage hematopoiesis. No significant increase in blasts. No significant increase in plasma cells.       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[place])   04/06/2015 Bone Marrow Biopsy    Accession: [id number] BM biopsy showed RAEB-1      04/06/2015 Tumor Marker    Cytogenetics and FISH for MDS are within normal limits      10/06/2015 - 10/10/2015 Chemotherapy    She received conditioning chemotherapy with busulfan and melphalan      10/12/2015 Bone Marrow Transplant    She received allogenic stem cell transplant      10/19/2015 Adverse Reaction    She developed posttransplant complication with mucositis, viral infection with rhinovirus, neutropenic fever, bilateral pleural effusion and moderate pericardial effusion and CMV reactivation.      10/31/2015 Miscellaneous    She has engrafted       INTERVAL HISTORY: Please see below for problem oriented charting. She returns for further follow-up and supportive care visit. She had recent viral upper respiratory tract illness with cold-like symptoms such as nasal congestion, mild cough and sore throat but overall her symptoms are improving on conservative management. She denies recent changes in bowel habits. She has gained some weight. She continues to have minor bilateral lower extremity edema.  REVIEW OF SYSTEMS:   Constitutional: Denies fevers, chills Eyes: Denies blurriness of vision Ears, nose, mouth, throat, and face: Denies mucositis or sore throat Cardiovascular: Denies palpitation, chest discomfort  Gastrointestinal:  Denies nausea, heartburn or change in bowel habits Skin: Denies abnormal skin rashes Lymphatics: Denies new lymphadenopathy or easy bruising Neurological:Denies numbness, tingling or new weaknesses Behavioral/Psych: Mood is stable, no new changes  All other systems were reviewed with the patient and are negative.  I have reviewed the past medical history, past surgical history, social history and family history with the patient and they are unchanged from previous note.  ALLERGIES:  has No Known Allergies.  MEDICATIONS:   Current Outpatient Prescriptions  Medication Sig Dispense Refill  . acyclovir (ZOVIRAX) 400 MG tablet TAKE 1 TABLET (400 MG TOTAL) BY MOUTH 2 (TWO) TIMES DAILY. 180 tablet 3  . carvedilol (COREG) 6.25 MG tablet Take 6.25 mg by mouth 2 (two) times daily.    . Cholecalciferol (VITAMIN D-1000 MAX ST) 1000 units tablet Take 1,000 Units by mouth daily.    . dapsone 100 MG tablet Take 100 mg by mouth daily.    Marland Kitchen loperamide (IMODIUM) 2 MG capsule Take by mouth as needed for diarrhea or loose stools.    Marland Kitchen LORazepam (ATIVAN) 0.5 MG tablet Take 0.5 mg by mouth 2 (two) times daily as needed.    . Melatonin 300 MCG TABS Take 300 mcg by mouth at bedtime as needed.    . mirtazapine (REMERON) 30 MG tablet Take 1 tablet (30 mg total) by mouth at bedtime.  90 tablet 3  . Multiple Vitamin (MULTIVITAMIN WITH MINERALS) TABS tablet Take 1 tablet by mouth daily.    . pantoprazole (PROTONIX) 40 MG tablet Take 40 mg by mouth every 12 (twelve) hours.    . prochlorperazine (COMPAZINE) 5 MG tablet Take 5 mg by mouth every 6 (six) hours as needed.    . tacrolimus (PROGRAF) 0.5 MG capsule Take 0.5 mg by mouth daily.     Marland Kitchen UNABLE TO FIND 2 (two) times daily. Med Name: Magnesium plus protein     . voriconazole (VFEND) 200 MG tablet Take 200 mg by mouth 2 (two) times daily. 400 mg in am and 300 mg in pm     No current facility-administered medications for this visit.     PHYSICAL EXAMINATION: ECOG PERFORMANCE STATUS: 1 - Symptomatic but completely ambulatory  Vitals:   04/17/16 1353  BP: (!) 124/58  Pulse: 79  Resp: 18  Temp: 98.4 F (36.9 C)   Filed Weights   04/17/16 1353  Weight: 127 lb 6.4 oz (57.8 kg)    GENERAL:alert, no distress and comfortable. She has mild nasal congestion on exam SKIN: skin color, texture, turgor are normal, no rashes or significant lesions EYES: normal, Conjunctiva are pink and non-injected, sclera clear OROPHARYNX:no exudate, no erythema and lips, buccal mucosa, and tongue normal   NECK: supple, thyroid normal size, non-tender, without nodularity LYMPH:  no palpable lymphadenopathy in the cervical, axillary or inguinal LUNGS: clear to auscultation and percussion with normal breathing effort HEART: regular rate & rhythm and no murmurs and no lower extremity edema ABDOMEN:abdomen soft, non-tender and normal bowel sounds Musculoskeletal:no cyanosis of digits and no clubbing  NEURO: alert & oriented x 3 with fluent speech, no focal motor/sensory deficits  LABORATORY DATA:  I have reviewed the data as listed    Component Value Date/Time   NA 140 04/17/2016 1337   K 4.8 04/17/2016 1337   CL 106 08/06/2015 0530   CO2 24 04/17/2016 1337   GLUCOSE 90 04/17/2016 1337   BUN 16.5 04/17/2016 1337   CREATININE 0.8 04/17/2016 1337   CALCIUM 8.3 (L) 04/17/2016 1337   PROT 6.1 (L) 04/17/2016 1337   ALBUMIN 3.4 (L) 04/17/2016 1337   AST 16 04/17/2016 1337   ALT <6 04/17/2016 1337   ALKPHOS 168 (H) 04/17/2016 1337   BILITOT 0.42 04/17/2016 1337   GFRNONAA >60 08/06/2015 0530   GFRAA >60 08/06/2015 0530    No results found for: SPEP, UPEP  Lab Results  Component Value Date   WBC 4.9 04/17/2016   NEUTROABS 3.1 04/17/2016   HGB 8.9 (L) 04/17/2016   HCT 26.6 (L) 04/17/2016   MCV 108.3 (H) 04/17/2016   PLT 131 (L) 04/17/2016      Chemistry      Component Value Date/Time   NA 140 04/17/2016 1337   K 4.8 04/17/2016 1337   CL 106 08/06/2015 0530   CO2 24 04/17/2016 1337   BUN 16.5 04/17/2016 1337   CREATININE 0.8 04/17/2016 1337      Component Value Date/Time   CALCIUM 8.3 (L) 04/17/2016 1337   ALKPHOS 168 (H) 04/17/2016 1337   AST 16 04/17/2016 1337   ALT <6 04/17/2016 1337   BILITOT 0.42 04/17/2016 1337      ASSESSMENT & PLAN:  Multiple myeloma in remission ([place]) I review her blood work from [place] dated 04/10/2016. Immunofixation detected monoclonal IgA lambda, 0.05 g Further treatment is not recommended at this point per instructions from her  hematologist  at [place] (myelodysplastic syndrome) [facility]) Her bone marrow transplant is successful. Recent bone marrow biopsy showed no residual blasts. Continue supportive care  Bone marrow transplant status Her instructions from her hematologist at [facility], she will continue tacrolimus for GVHD prophylaxis She will also receive dapsone, acyclovir and voriconazole for antimicrobial prophylaxis.  Acquired hypogammaglobulinemia She has received IVIG recently  Pancytopenia, acquired [facility]) She is transfusion independent. She is mildly pancytopenic today. She does not need G-CSF unless ANC less than 1000 She does not need transfusion today. She is not symptomatic  Bilateral leg edema She has bilateral leg edema likely due to water retention and related to low total protein. 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 She is not symptomatic and she will continue current antiviral treatment as directed by her transplant physician. I have ordered blood work to check the PCR as requested.  Upper respiratory infection, viral She has symptoms of a recent cold. CBC show no evidence of leukocytosis. Overall, with conservative management using over-the-counter cough drops, Mucinex and Advil, she is improving. I would not recommend antibiotic therapy for now.   Orders Placed This Encounter  Procedures  . Magnesium    Standing Status:   Standing    Number of Occurrences:   9    Standing Expiration Date:   04/17/2017   All questions were answered. The patient knows to call the clinic with any problems, questions or concerns. No barriers to learning was detected. I spent 20 minutes counseling the patient face to face. The total time spent in the appointment was 30 minutes and more than 50% was on counseling and review of test results     [name], MD 04/17/2016 2:41  PM

## 2016-04-17 NOTE — Telephone Encounter (Signed)
AVS report and appointment schedule given to patient, per 04/17/16 los.

## 2016-04-19 LAB — CMV DNA, QUANTITATIVE, PCR: CMV Quant DNA PCR (Plasma): POSITIVE IU/mL

## 2016-04-20 DIAGNOSIS — B079 Viral wart, unspecified: Secondary | ICD-10-CM | POA: Diagnosis not present

## 2016-04-20 DIAGNOSIS — Z23 Encounter for immunization: Secondary | ICD-10-CM | POA: Diagnosis not present

## 2016-04-20 DIAGNOSIS — D2262 Melanocytic nevi of left upper limb, including shoulder: Secondary | ICD-10-CM | POA: Diagnosis not present

## 2016-04-20 DIAGNOSIS — D225 Melanocytic nevi of trunk: Secondary | ICD-10-CM | POA: Diagnosis not present

## 2016-04-24 ENCOUNTER — Ambulatory Visit (HOSPITAL_BASED_OUTPATIENT_CLINIC_OR_DEPARTMENT_OTHER): Payer: Medicare HMO | Admitting: *Deleted

## 2016-04-24 DIAGNOSIS — D469 Myelodysplastic syndrome, unspecified: Secondary | ICD-10-CM

## 2016-04-24 DIAGNOSIS — B259 Cytomegaloviral disease, unspecified: Secondary | ICD-10-CM

## 2016-04-24 DIAGNOSIS — Z9481 Bone marrow transplant status: Secondary | ICD-10-CM | POA: Diagnosis not present

## 2016-04-24 DIAGNOSIS — C9001 Multiple myeloma in remission: Secondary | ICD-10-CM | POA: Diagnosis not present

## 2016-04-24 LAB — CBC WITH DIFFERENTIAL/PLATELET
BASO%: 0.2 % (ref 0.0–2.0)
Basophils Absolute: 0 10*3/uL (ref 0.0–0.1)
EOS%: 0.8 % (ref 0.0–7.0)
Eosinophils Absolute: 0 10*3/uL (ref 0.0–0.5)
HCT: 27.1 % — ABNORMAL LOW (ref 34.8–46.6)
HGB: 9.1 g/dL — ABNORMAL LOW (ref 11.6–15.9)
LYMPH%: 24.1 % (ref 14.0–49.7)
MCH: 36 pg — ABNORMAL HIGH (ref 25.1–34.0)
MCHC: 33.5 g/dL (ref 31.5–36.0)
MCV: 107.5 fL — ABNORMAL HIGH (ref 79.5–101.0)
MONO#: 0.3 10*3/uL (ref 0.1–0.9)
MONO%: 8.5 % (ref 0.0–14.0)
NEUT#: 2.2 10*3/uL (ref 1.5–6.5)
NEUT%: 66.4 % (ref 38.4–76.8)
Platelets: 151 10*3/uL (ref 145–400)
RBC: 2.52 10*6/uL — ABNORMAL LOW (ref 3.70–5.45)
RDW: 14.1 % (ref 11.2–14.5)
WBC: 3.3 10*3/uL — ABNORMAL LOW (ref 3.9–10.3)
lymph#: 0.8 10*3/uL — ABNORMAL LOW (ref 0.9–3.3)

## 2016-04-24 LAB — COMPREHENSIVE METABOLIC PANEL
ALT: 12 U/L (ref 0–55)
AST: 15 U/L (ref 5–34)
Albumin: 3.3 g/dL — ABNORMAL LOW (ref 3.5–5.0)
Alkaline Phosphatase: 193 U/L — ABNORMAL HIGH (ref 40–150)
Anion Gap: 7 mEq/L (ref 3–11)
BUN: 17.6 mg/dL (ref 7.0–26.0)
CO2: 24 mEq/L (ref 22–29)
Calcium: 8.4 mg/dL (ref 8.4–10.4)
Chloride: 111 mEq/L — ABNORMAL HIGH (ref 98–109)
Creatinine: 0.9 mg/dL (ref 0.6–1.1)
EGFR: 66 mL/min/{1.73_m2} — ABNORMAL LOW (ref >90)
Glucose: 144 mg/dl — ABNORMAL HIGH (ref 70–140)
Potassium: 4.4 mEq/L (ref 3.5–5.1)
Sodium: 142 mEq/L (ref 136–145)
Total Bilirubin: 0.33 mg/dL (ref 0.20–1.20)
Total Protein: 6 g/dL — ABNORMAL LOW (ref 6.4–8.3)

## 2016-04-24 LAB — MAGNESIUM: Magnesium: 1.7 mg/dl (ref 1.5–2.5)

## 2016-04-26 LAB — CMV DNA, QUANTITATIVE, PCR: CMV Quant DNA PCR (Plasma): POSITIVE IU/mL

## 2016-04-27 ENCOUNTER — Encounter: Payer: Self-pay | Admitting: *Deleted

## 2016-04-27 NOTE — Progress Notes (Signed)
Lab results faxed to Dr. Nila Nephew at [place], [phone]

## 2016-05-01 DIAGNOSIS — Z9481 Bone marrow transplant status: Secondary | ICD-10-CM | POA: Diagnosis not present

## 2016-05-01 DIAGNOSIS — M8580 Other specified disorders of bone density and structure, unspecified site: Secondary | ICD-10-CM | POA: Diagnosis not present

## 2016-05-01 DIAGNOSIS — M8589 Other specified disorders of bone density and structure, multiple sites: Secondary | ICD-10-CM | POA: Diagnosis not present

## 2016-05-01 DIAGNOSIS — Z780 Asymptomatic menopausal state: Secondary | ICD-10-CM | POA: Diagnosis not present

## 2016-05-01 DIAGNOSIS — I313 Pericardial effusion (noninflammatory): Secondary | ICD-10-CM | POA: Diagnosis not present

## 2016-05-01 DIAGNOSIS — D469 Myelodysplastic syndrome, unspecified: Secondary | ICD-10-CM | POA: Diagnosis not present

## 2016-05-01 DIAGNOSIS — I517 Cardiomegaly: Secondary | ICD-10-CM | POA: Diagnosis not present

## 2016-05-01 DIAGNOSIS — R600 Localized edema: Secondary | ICD-10-CM | POA: Diagnosis not present

## 2016-05-01 DIAGNOSIS — M5136 Other intervertebral disc degeneration, lumbar region: Secondary | ICD-10-CM | POA: Diagnosis not present

## 2016-05-01 DIAGNOSIS — R0981 Nasal congestion: Secondary | ICD-10-CM | POA: Diagnosis not present

## 2016-05-01 DIAGNOSIS — J3489 Other specified disorders of nose and nasal sinuses: Secondary | ICD-10-CM | POA: Diagnosis not present

## 2016-05-02 ENCOUNTER — Encounter: Payer: Self-pay | Admitting: Hematology and Oncology

## 2016-05-08 DIAGNOSIS — D469 Myelodysplastic syndrome, unspecified: Secondary | ICD-10-CM | POA: Diagnosis not present

## 2016-05-08 DIAGNOSIS — Z9481 Bone marrow transplant status: Secondary | ICD-10-CM | POA: Diagnosis not present

## 2016-05-08 DIAGNOSIS — C9001 Multiple myeloma in remission: Secondary | ICD-10-CM | POA: Diagnosis not present

## 2016-05-08 DIAGNOSIS — Z9484 Stem cells transplant status: Secondary | ICD-10-CM | POA: Diagnosis not present

## 2016-05-09 ENCOUNTER — Other Ambulatory Visit: Payer: Self-pay | Admitting: Obstetrics and Gynecology

## 2016-05-09 DIAGNOSIS — Z1231 Encounter for screening mammogram for malignant neoplasm of breast: Secondary | ICD-10-CM

## 2016-05-15 ENCOUNTER — Other Ambulatory Visit (HOSPITAL_BASED_OUTPATIENT_CLINIC_OR_DEPARTMENT_OTHER): Payer: Medicare HMO

## 2016-05-15 DIAGNOSIS — C9001 Multiple myeloma in remission: Secondary | ICD-10-CM

## 2016-05-15 DIAGNOSIS — D469 Myelodysplastic syndrome, unspecified: Secondary | ICD-10-CM | POA: Diagnosis not present

## 2016-05-15 DIAGNOSIS — Z9481 Bone marrow transplant status: Secondary | ICD-10-CM | POA: Diagnosis not present

## 2016-05-15 DIAGNOSIS — B259 Cytomegaloviral disease, unspecified: Secondary | ICD-10-CM | POA: Diagnosis not present

## 2016-05-15 LAB — CBC WITH DIFFERENTIAL/PLATELET
BASO%: 0.3 % (ref 0.0–2.0)
Basophils Absolute: 0 10e3/uL (ref 0.0–0.1)
EOS%: 0.8 % (ref 0.0–7.0)
Eosinophils Absolute: 0 10e3/uL (ref 0.0–0.5)
HCT: 31.6 % — ABNORMAL LOW (ref 34.8–46.6)
HGB: 10.7 g/dL — ABNORMAL LOW (ref 11.6–15.9)
LYMPH%: 20.4 % (ref 14.0–49.7)
MCH: 36.5 pg — ABNORMAL HIGH (ref 25.1–34.0)
MCHC: 33.9 g/dL (ref 31.5–36.0)
MCV: 107.8 fL — ABNORMAL HIGH (ref 79.5–101.0)
MONO#: 0.4 10e3/uL (ref 0.1–0.9)
MONO%: 8.9 % (ref 0.0–14.0)
NEUT#: 2.7 10e3/uL (ref 1.5–6.5)
NEUT%: 69.6 % (ref 38.4–76.8)
Platelets: 171 10e3/uL (ref 145–400)
RBC: 2.93 10e6/uL — ABNORMAL LOW (ref 3.70–5.45)
RDW: 13.1 % (ref 11.2–14.5)
WBC: 3.9 10e3/uL (ref 3.9–10.3)
lymph#: 0.8 10e3/uL — ABNORMAL LOW (ref 0.9–3.3)

## 2016-05-15 LAB — COMPREHENSIVE METABOLIC PANEL
ALT: <6 U/L (ref 0–55)
AST: 12 U/L (ref 5–34)
Albumin: 3.4 g/dL — ABNORMAL LOW (ref 3.5–5.0)
Alkaline Phosphatase: 165 U/L — ABNORMAL HIGH (ref 40–150)
Anion Gap: 6 mEq/L (ref 3–11)
BUN: 18.7 mg/dL (ref 7.0–26.0)
CO2: 25 mEq/L (ref 22–29)
Calcium: 9.2 mg/dL (ref 8.4–10.4)
Chloride: 109 mEq/L (ref 98–109)
Creatinine: 0.8 mg/dL (ref 0.6–1.1)
EGFR: 70 mL/min/{1.73_m2} — ABNORMAL LOW (ref >90)
Glucose: 156 mg/dl — ABNORMAL HIGH (ref 70–140)
Potassium: 4.6 mEq/L (ref 3.5–5.1)
Sodium: 140 mEq/L (ref 136–145)
Total Bilirubin: 0.38 mg/dL (ref 0.20–1.20)
Total Protein: 6.2 g/dL — ABNORMAL LOW (ref 6.4–8.3)

## 2016-05-15 LAB — MAGNESIUM: Magnesium: 1.5 mg/dL (ref 1.5–2.5)

## 2016-05-16 LAB — CMV DNA, QUANTITATIVE, PCR: CMV Quant DNA PCR (Plasma): POSITIVE IU/mL

## 2016-05-17 ENCOUNTER — Encounter: Payer: Self-pay | Admitting: *Deleted

## 2016-05-25 ENCOUNTER — Ambulatory Visit (HOSPITAL_BASED_OUTPATIENT_CLINIC_OR_DEPARTMENT_OTHER): Payer: Medicare HMO | Admitting: Hematology and Oncology

## 2016-05-25 ENCOUNTER — Encounter: Payer: Self-pay | Admitting: Hematology and Oncology

## 2016-05-25 ENCOUNTER — Other Ambulatory Visit (HOSPITAL_BASED_OUTPATIENT_CLINIC_OR_DEPARTMENT_OTHER): Payer: Medicare HMO

## 2016-05-25 DIAGNOSIS — R5381 Other malaise: Secondary | ICD-10-CM

## 2016-05-25 DIAGNOSIS — C9001 Multiple myeloma in remission: Secondary | ICD-10-CM | POA: Diagnosis not present

## 2016-05-25 DIAGNOSIS — D61818 Other pancytopenia: Secondary | ICD-10-CM

## 2016-05-25 DIAGNOSIS — D469 Myelodysplastic syndrome, unspecified: Secondary | ICD-10-CM

## 2016-05-25 DIAGNOSIS — Z9481 Bone marrow transplant status: Secondary | ICD-10-CM

## 2016-05-25 DIAGNOSIS — B259 Cytomegaloviral disease, unspecified: Secondary | ICD-10-CM

## 2016-05-25 LAB — CBC WITH DIFFERENTIAL/PLATELET
BASO%: 0.2 % (ref 0.0–2.0)
Basophils Absolute: 0 10*3/uL (ref 0.0–0.1)
EOS%: 0.6 % (ref 0.0–7.0)
Eosinophils Absolute: 0 10*3/uL (ref 0.0–0.5)
HCT: 33.1 % — ABNORMAL LOW (ref 34.8–46.6)
HGB: 11.1 g/dL — ABNORMAL LOW (ref 11.6–15.9)
LYMPH%: 21.3 % (ref 14.0–49.7)
MCH: 35.6 pg — ABNORMAL HIGH (ref 25.1–34.0)
MCHC: 33.5 g/dL (ref 31.5–36.0)
MCV: 106.1 fL — ABNORMAL HIGH (ref 79.5–101.0)
MONO#: 0.4 10*3/uL (ref 0.1–0.9)
MONO%: 7.7 % (ref 0.0–14.0)
NEUT#: 3.4 10*3/uL (ref 1.5–6.5)
NEUT%: 70.2 % (ref 38.4–76.8)
Platelets: 129 10*3/uL — ABNORMAL LOW (ref 145–400)
RBC: 3.12 10*6/uL — ABNORMAL LOW (ref 3.70–5.45)
RDW: 12.9 % (ref 11.2–14.5)
WBC: 4.8 10*3/uL (ref 3.9–10.3)
lymph#: 1 10*3/uL (ref 0.9–3.3)

## 2016-05-25 LAB — COMPREHENSIVE METABOLIC PANEL
ALT: 6 U/L (ref 0–55)
AST: 12 U/L (ref 5–34)
Albumin: 3.6 g/dL (ref 3.5–5.0)
Alkaline Phosphatase: 181 U/L — ABNORMAL HIGH (ref 40–150)
Anion Gap: 7 mEq/L (ref 3–11)
BUN: 23.4 mg/dL (ref 7.0–26.0)
CO2: 25 mEq/L (ref 22–29)
Calcium: 9 mg/dL (ref 8.4–10.4)
Chloride: 109 mEq/L (ref 98–109)
Creatinine: 0.9 mg/dL (ref 0.6–1.1)
EGFR: 62 mL/min/{1.73_m2} — ABNORMAL LOW (ref >90)
Glucose: 143 mg/dl — ABNORMAL HIGH (ref 70–140)
Potassium: 4.6 mEq/L (ref 3.5–5.1)
Sodium: 141 mEq/L (ref 136–145)
Total Bilirubin: 0.39 mg/dL (ref 0.20–1.20)
Total Protein: 6.5 g/dL (ref 6.4–8.3)

## 2016-05-25 LAB — MAGNESIUM: Magnesium: 1.9 mg/dl (ref 1.5–2.5)

## 2016-05-25 NOTE — Assessment & Plan Note (Signed)
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 Recent bone marrow biopsy showed no residual blasts. She is due for another bone marrow biopsy next week Continue supportive care

## 2016-05-25 NOTE — Assessment & Plan Note (Signed)
Her instructions from her hematologist at [facility], she will continue tacrolimus for GVHD prophylaxis She will also receive dapsone, acyclovir and voriconazole for antimicrobial prophylaxis.

## 2016-05-25 NOTE — Progress Notes (Signed)
[facility] OFFICE PROGRESS NOTE  Patient Care Team: Jinny Sanders, MD as PCP - General (Family Medicine) [name], MD as Referring Physician (Hematology and Oncology) [name], MD as Consulting Physician (Internal Medicine) Truman Hayward, MD as Consulting Physician (Infectious Diseases) Deanne Coffer An Nila Nephew, MD as Consulting Physician (Hematology and Oncology) [name], NP as Nurse Practitioner (Hematology and Oncology)  SUMMARY OF ONCOLOGIC HISTORY: Oncology History   Multiple myeloma, Ig A Lambda, M spike 3.54 grams, Calcium 9.2, Creatinine 0.8, Beta 2 microglobulin 4.52, IgA 4840 mg/dL, lambda light chain 75.4, albumin 3.6, hemoglobin 9.7, platelet 115    Primary site: Multiple Myeloma   Staging method: AJCC 6th Edition   Clinical: Stage IIA signed by [name], MD on 11/07/2013  2:46 PM   Summary: Stage IIA        Multiple myeloma in remission ([place])   10/31/2013 Bone Marrow Biopsy    Bone marrow biopsy confirmed multiple myeloma with 40% bone marrow involvement. Skeletal survey showed minimal lesions in her score with generalized demineralization      11/10/2013 - 02/13/2014 Chemotherapy    The patient is started on induction chemotherapy with weekly dexamethasone 40 mg by mouth as well as Velcade subcutaneous injection on days 1, 4, 8 and 11. On 11/21/2013, she was started on monthly Zometa.      12/23/2013 Adverse Reaction    The dose of Velcade was reduced due to thrombocytopenia.      01/28/2014 - 04/07/2014 Chemotherapy    Revlimid is added. Treatment was discontinued due to lack of response.      02/24/2014 - 04/07/2014 Chemotherapy    Due to worsening peripheral neuropathy, Velcade injection is changed to once a week. Revlimid was given 21 days on, 7 days off.      04/07/2014 - 04/10/2014 Chemotherapy    Revlimid was discontinued due to lack of response. Chemotherapy was changed back to Velcade injection twice a  week, 2 weeks on 1 week off. Her treatment was switched to to minimum response      04/20/2014 - 06/02/2014 Chemotherapy    chemotherapy is switched to Carfilzomib, Cytoxan and dexamethasone.      04/22/2014 Procedure    she has placement of port for chemotherapy.      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 [facility]      10/20/2014 - 03/24/2015 Chemotherapy     she received chemotherapy with Kyprolis, Revlimid and dexamethasone      10/22/2014 Procedure    She has port placement      01/19/2015 Tumor Marker    IgA lambda M spike at 0.4 g       01/20/2015 Miscellaneous    IVIG 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 No cource was found and fever resolved with IV vancomycin and meropenem      09/13/2015 Bone Marrow Biopsy    Bone marrow biopsy showed no increased blasts, 3-4 % plasma cells      03/02/2016 Bone Marrow Biopsy    Bone marrow biopsy at [facility] showed normocellular (30%) bone marrow with trilineage hematopoiesis. No significant increase in blasts. No significant increase in plasma cells.       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[place])   04/06/2015 Bone Marrow Biopsy    Accession: [id number] BM biopsy showed RAEB-1      04/06/2015 Tumor Marker    Cytogenetics and FISH for MDS are within normal limits      10/06/2015 - 10/10/2015 Chemotherapy    She received conditioning chemotherapy with busulfan and melphalan      10/12/2015 Bone Marrow Transplant    She received allogenic stem cell transplant      10/19/2015 Adverse Reaction    She developed posttransplant complication with mucositis, viral infection with rhinovirus, neutropenic fever, bilateral pleural effusion and moderate pericardial effusion and CMV reactivation.      10/31/2015 Miscellaneous    She has engrafted       INTERVAL HISTORY: Please see below for problem oriented charting. She returns for follow-up. She had excellent Thanksgiving holiday recently. However, since she returns home, she has some mnior back pain, likely due to her sleeping on the couch. She denies recent infection. The patient denies any recent signs or symptoms of bleeding such as spontaneous epistaxis, hematuria or hematochezia. She showed me her report of recent bone density scan which show mild osteopenia. She continues to have minor bilateral ankle edema. She appears motivated to start exercise program and got a exercise monitor/Fitbit  REVIEW OF SYSTEMS:   Constitutional: Denies fevers, chills or abnormal weight loss Eyes: Denies blurriness of vision Ears, nose, mouth, throat, and face: Denies mucositis or sore throat Respiratory: Denies cough, dyspnea or wheezes Cardiovascular: Denies palpitation, chest discomfort  Gastrointestinal:  Denies nausea, heartburn or change in bowel habits Skin: Denies abnormal skin rashes Lymphatics: Denies new lymphadenopathy or easy bruising Neurological:Denies numbness, tingling or new weaknesses Behavioral/Psych: Mood is stable, no new changes  All other systems were reviewed with the patient and are  negative.  I have reviewed the past medical history, past surgical history, social history and family history with the patient and they are unchanged from previous note.  ALLERGIES:  has No Known Allergies.  MEDICATIONS:  Current Outpatient Prescriptions  Medication Sig Dispense Refill  . acyclovir (ZOVIRAX) 400 MG tablet TAKE 1 TABLET (400 MG TOTAL) BY MOUTH 2 (TWO) TIMES DAILY. 180 tablet 3  . carvedilol (COREG) 6.25 MG tablet Take 6.25 mg by mouth 2 (two) times daily.    . Cholecalciferol (VITAMIN D-1000 MAX ST) 1000 units tablet Take 1,000 Units by mouth daily.    Marland Kitchen loperamide (IMODIUM) 2 MG capsule Take by mouth as needed for diarrhea or loose stools.    Marland Kitchen LORazepam (ATIVAN) 0.5 MG tablet Take 0.5 mg by mouth 2 (two) times daily as needed.    . Melatonin 300 MCG TABS Take 300 mcg by mouth at bedtime  as needed.    . mirtazapine (REMERON) 30 MG tablet Take 1 tablet (30 mg total) by mouth at bedtime. 90 tablet 3  . Multiple Vitamin (MULTIVITAMIN WITH MINERALS) TABS tablet Take 1 tablet by mouth daily.    . pantoprazole (PROTONIX) 40 MG tablet Take 40 mg by mouth every 12 (twelve) hours.    . prochlorperazine (COMPAZINE) 5 MG tablet Take 5 mg by mouth every 6 (six) hours as needed.    . tacrolimus (PROGRAF) 0.5 MG capsule Take 0.5 mg by mouth daily.     Marland Kitchen UNABLE TO FIND 2 (two) times daily. Med Name: Magnesium plus protein     . voriconazole (VFEND) 200 MG tablet Take 200 mg by mouth 2 (two) times daily. 400 mg in am and 300 mg in pm     No current facility-administered medications for this visit.     PHYSICAL EXAMINATION: ECOG PERFORMANCE STATUS: 1 - Symptomatic but completely ambulatory  Vitals:   05/25/16 0917  BP: (!) 157/57  Pulse: 81  Resp: 18  Temp: 97.9 F (36.6 C)   Filed Weights   05/25/16 0917  Weight: 125 lb 1.6 oz (56.7 kg)    GENERAL:alert, no distress and comfortable SKIN: skin color, texture, turgor are normal, no rashes or significant lesions EYES:  normal, Conjunctiva are pink and non-injected, sclera clear OROPHARYNX:no exudate, no erythema and lips, buccal mucosa, and tongue normal  NECK: supple, thyroid normal size, non-tender, without nodularity LYMPH:  no palpable lymphadenopathy in the cervical, axillary or inguinal LUNGS: clear to auscultation and percussion with normal breathing effort HEART: regular rate & rhythm and no murmurs and no lower extremity edema ABDOMEN:abdomen soft, non-tender and normal bowel sounds Musculoskeletal:no cyanosis of digits and no clubbing  NEURO: alert & oriented x 3 with fluent speech, no focal motor/sensory deficits  LABORATORY DATA:  I have reviewed the data as listed    Component Value Date/Time   NA 141 05/25/2016 0853   K 4.6 05/25/2016 0853   CL 106 08/06/2015 0530   CO2 25 05/25/2016 0853   GLUCOSE 143 (H) 05/25/2016 0853   BUN 23.4 05/25/2016 0853   CREATININE 0.9 05/25/2016 0853   CALCIUM 9.0 05/25/2016 0853   PROT 6.5 05/25/2016 0853   ALBUMIN 3.6 05/25/2016 0853   AST 12 05/25/2016 0853   ALT 6 05/25/2016 0853   ALKPHOS 181 (H) 05/25/2016 0853   BILITOT 0.39 05/25/2016 0853   GFRNONAA >60 08/06/2015 0530   GFRAA >60 08/06/2015 0530    No results found for: SPEP, UPEP  Lab Results  Component Value Date   WBC 4.8 05/25/2016   NEUTROABS 3.4 05/25/2016   HGB 11.1 (L) 05/25/2016   HCT 33.1 (L) 05/25/2016   MCV 106.1 (H) 05/25/2016   PLT 129 (L) 05/25/2016      Chemistry      Component Value Date/Time   NA 141 05/25/2016 0853   K 4.6 05/25/2016 0853   CL 106 08/06/2015 0530   CO2 25 05/25/2016 0853   BUN 23.4 05/25/2016 0853   CREATININE 0.9 05/25/2016 0853      Component Value Date/Time   CALCIUM 9.0 05/25/2016 0853   ALKPHOS 181 (H) 05/25/2016 0853   AST 12 05/25/2016 0853   ALT 6 05/25/2016 0853   BILITOT 0.39 05/25/2016 0853     ASSESSMENT & PLAN:  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 Recent bone marrow biopsy  showed no residual blasts. She is due for another bone  marrow biopsy next week Continue supportive care  Multiple myeloma in remission [facility]) I review her blood work from [facility] dated 04/10/2016. Immunofixation detected monoclonal IgA lambda, 0.05 g Further treatment is not recommended at this point per instructions from her hematologist at [facility] She is due for further workup and repeat bone marrow biopsy again next week.  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 She is not symptomatic and she will continue current antiviral treatment as directed by her transplant physician. I have ordered blood work to check the PCR as requested.  Bone marrow transplant status Her instructions from her hematologist at [facility], she will continue tacrolimus for GVHD prophylaxis She will also receive dapsone, acyclovir and voriconazole for antimicrobial prophylaxis.  Pancytopenia, acquired [facility]) She is transfusion independent. She does not need transfusion today. She is not symptomatic  Physical deconditioning She has significant deconditioning and loss of muscle mass. The patient appears to be very motivated to undergo exercise program. I encouraged her to start gentle exercise is tolerated.   No orders of the defined types were placed in this encounter.  All questions were answered. The patient knows to call the clinic with any problems, questions or concerns. No barriers to learning was detected. I spent 20 minutes counseling the patient face to face. The total time spent in the appointment was 25 minutes and more than 50% was on counseling and review of test results     [name], MD 05/25/2016 10:37 AM

## 2016-05-25 NOTE — Assessment & Plan Note (Signed)
The patient have history of CMV viremia. She is not symptomatic and she will continue current antiviral treatment as directed by her transplant physician. I have ordered blood work to check the PCR as requested.

## 2016-05-25 NOTE — Assessment & Plan Note (Signed)
I review her blood work from [facility] dated 04/10/2016. Immunofixation detected monoclonal IgA lambda, 0.05 g Further treatment is not recommended at this point per instructions from her hematologist at [facility] She is due for further workup and repeat bone marrow biopsy again next week.

## 2016-05-25 NOTE — Assessment & Plan Note (Signed)
She has significant deconditioning and loss of muscle mass. The patient appears to be very motivated to undergo exercise program. I encouraged her to start gentle exercise is tolerated.

## 2016-05-25 NOTE — Assessment & Plan Note (Signed)
She is transfusion independent. She does not need transfusion today. She is not symptomatic

## 2016-05-29 ENCOUNTER — Encounter: Payer: Self-pay | Admitting: Hematology and Oncology

## 2016-05-29 ENCOUNTER — Ambulatory Visit: Payer: Medicare HMO

## 2016-05-29 DIAGNOSIS — D469 Myelodysplastic syndrome, unspecified: Secondary | ICD-10-CM | POA: Diagnosis not present

## 2016-05-29 DIAGNOSIS — Z9481 Bone marrow transplant status: Secondary | ICD-10-CM | POA: Diagnosis not present

## 2016-05-29 DIAGNOSIS — C9001 Multiple myeloma in remission: Secondary | ICD-10-CM | POA: Diagnosis not present

## 2016-05-29 DIAGNOSIS — D4989 Neoplasm of unspecified behavior of other specified sites: Secondary | ICD-10-CM | POA: Diagnosis not present

## 2016-05-30 ENCOUNTER — Telehealth: Payer: Self-pay | Admitting: *Deleted

## 2016-05-30 ENCOUNTER — Other Ambulatory Visit: Payer: Self-pay | Admitting: Hematology and Oncology

## 2016-05-30 LAB — CMV DNA, QUANTITATIVE, PCR: CMV Quant DNA PCR (Plasma): NEGATIVE IU/mL

## 2016-05-30 NOTE — Telephone Encounter (Signed)
-----   Message from [name], MD sent at 05/30/2016  8:15 AM EST ----- Regarding: CMV results Pls fax report to [facility] ----- Message ----- From: Interface, Lab In Three Zero One Sent: 05/25/2016   9:07 AM To: [name], MD

## 2016-05-30 NOTE — Telephone Encounter (Signed)
Labs faxed to [facility]

## 2016-05-31 DIAGNOSIS — Z1231 Encounter for screening mammogram for malignant neoplasm of breast: Secondary | ICD-10-CM | POA: Diagnosis not present

## 2016-06-01 ENCOUNTER — Encounter: Payer: Self-pay | Admitting: General Practice

## 2016-06-01 NOTE — Progress Notes (Signed)
[place] Note  Jean Davidson by phone because she has not attending Multiple Myeloma group in months.  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  She valued call and encouragement and is aware of ongoing group and Support Team availability.  Please also page if immediate needs arise.  Thank you.   [place], [place], [facility] Pager [phone] Voicemail [phone]

## 2016-06-05 ENCOUNTER — Telehealth: Payer: Self-pay | Admitting: Hematology and Oncology

## 2016-06-05 NOTE — Telephone Encounter (Signed)
sw pt to confirm Dec and Jan appt date/times per LOS

## 2016-06-07 DIAGNOSIS — H353131 Nonexudative age-related macular degeneration, bilateral, early dry stage: Secondary | ICD-10-CM | POA: Diagnosis not present

## 2016-06-09 DIAGNOSIS — Z01411 Encounter for gynecological examination (general) (routine) with abnormal findings: Secondary | ICD-10-CM | POA: Diagnosis not present

## 2016-06-09 DIAGNOSIS — N8111 Cystocele, midline: Secondary | ICD-10-CM | POA: Diagnosis not present

## 2016-06-09 DIAGNOSIS — Z1389 Encounter for screening for other disorder: Secondary | ICD-10-CM | POA: Diagnosis not present

## 2016-06-12 ENCOUNTER — Other Ambulatory Visit (HOSPITAL_BASED_OUTPATIENT_CLINIC_OR_DEPARTMENT_OTHER): Payer: Medicare HMO

## 2016-06-12 ENCOUNTER — Other Ambulatory Visit: Payer: Self-pay | Admitting: *Deleted

## 2016-06-12 ENCOUNTER — Other Ambulatory Visit: Payer: Self-pay | Admitting: Hematology and Oncology

## 2016-06-12 ENCOUNTER — Telehealth: Payer: Self-pay | Admitting: *Deleted

## 2016-06-12 DIAGNOSIS — Z9481 Bone marrow transplant status: Secondary | ICD-10-CM | POA: Diagnosis not present

## 2016-06-12 DIAGNOSIS — B259 Cytomegaloviral disease, unspecified: Secondary | ICD-10-CM | POA: Diagnosis not present

## 2016-06-12 DIAGNOSIS — D61818 Other pancytopenia: Secondary | ICD-10-CM

## 2016-06-12 DIAGNOSIS — C9001 Multiple myeloma in remission: Secondary | ICD-10-CM

## 2016-06-12 DIAGNOSIS — D469 Myelodysplastic syndrome, unspecified: Secondary | ICD-10-CM | POA: Diagnosis not present

## 2016-06-12 LAB — CBC WITH DIFFERENTIAL/PLATELET
BASO%: 0.2 % (ref 0.0–2.0)
Basophils Absolute: 0 10*3/uL (ref 0.0–0.1)
EOS%: 0 % (ref 0.0–7.0)
Eosinophils Absolute: 0 10*3/uL (ref 0.0–0.5)
HCT: 31.2 % — ABNORMAL LOW (ref 34.8–46.6)
HGB: 10.4 g/dL — ABNORMAL LOW (ref 11.6–15.9)
LYMPH%: 24.3 % (ref 14.0–49.7)
MCH: 35.4 pg — ABNORMAL HIGH (ref 25.1–34.0)
MCHC: 33.3 g/dL (ref 31.5–36.0)
MCV: 106.1 fL — ABNORMAL HIGH (ref 79.5–101.0)
MONO#: 0.5 10*3/uL (ref 0.1–0.9)
MONO%: 13.1 % (ref 0.0–14.0)
NEUT#: 2.6 10*3/uL (ref 1.5–6.5)
NEUT%: 62.4 % (ref 38.4–76.8)
Platelets: 136 10*3/uL — ABNORMAL LOW (ref 145–400)
RBC: 2.94 10*6/uL — ABNORMAL LOW (ref 3.70–5.45)
RDW: 13.2 % (ref 11.2–14.5)
WBC: 4.1 10*3/uL (ref 3.9–10.3)
lymph#: 1 10*3/uL (ref 0.9–3.3)

## 2016-06-12 LAB — COMPREHENSIVE METABOLIC PANEL
ALT: 6 U/L (ref 0–55)
AST: 12 U/L (ref 5–34)
Albumin: 3.3 g/dL — ABNORMAL LOW (ref 3.5–5.0)
Alkaline Phosphatase: 154 U/L — ABNORMAL HIGH (ref 40–150)
Anion Gap: 6 mEq/L (ref 3–11)
BUN: 12.9 mg/dL (ref 7.0–26.0)
CO2: 28 mEq/L (ref 22–29)
Calcium: 8.7 mg/dL (ref 8.4–10.4)
Chloride: 108 mEq/L (ref 98–109)
Creatinine: 0.8 mg/dL (ref 0.6–1.1)
EGFR: 79 mL/min/{1.73_m2} — ABNORMAL LOW (ref >90)
Glucose: 110 mg/dl (ref 70–140)
Potassium: 4.2 mEq/L (ref 3.5–5.1)
Sodium: 142 mEq/L (ref 136–145)
Total Bilirubin: 0.3 mg/dL (ref 0.20–1.20)
Total Protein: 6.1 g/dL — ABNORMAL LOW (ref 6.4–8.3)

## 2016-06-12 LAB — MAGNESIUM: Magnesium: 1.9 mg/dl (ref 1.5–2.5)

## 2016-06-12 NOTE — Telephone Encounter (Signed)
Informed pt of lab results ok and I faxed copies to [facility]. She verbalized understanding.

## 2016-06-12 NOTE — Telephone Encounter (Signed)
-----   Message from [name], MD sent at 06/12/2016 11:22 AM EST ----- Regarding: labs Pls let her know labs are OK and fax to [facility] ----- Message ----- From: Interface, Lab In Three Zero One Sent: 06/12/2016  10:49 AM To: [name], MD

## 2016-06-15 LAB — CMV DNA, QUANTITATIVE, PCR: CMV Quant DNA PCR (Plasma): NEGATIVE IU/mL

## 2016-06-27 DIAGNOSIS — C9001 Multiple myeloma in remission: Secondary | ICD-10-CM | POA: Diagnosis not present

## 2016-06-27 DIAGNOSIS — Z9481 Bone marrow transplant status: Secondary | ICD-10-CM | POA: Diagnosis not present

## 2016-06-27 DIAGNOSIS — Z9484 Stem cells transplant status: Secondary | ICD-10-CM | POA: Diagnosis not present

## 2016-06-27 DIAGNOSIS — D469 Myelodysplastic syndrome, unspecified: Secondary | ICD-10-CM | POA: Diagnosis not present

## 2016-06-27 DIAGNOSIS — R69 Illness, unspecified: Secondary | ICD-10-CM | POA: Diagnosis not present

## 2016-07-03 ENCOUNTER — Other Ambulatory Visit: Payer: Medicare HMO

## 2016-07-06 DIAGNOSIS — C9000 Multiple myeloma not having achieved remission: Secondary | ICD-10-CM | POA: Diagnosis not present

## 2016-07-06 DIAGNOSIS — C9001 Multiple myeloma in remission: Secondary | ICD-10-CM | POA: Diagnosis not present

## 2016-07-17 ENCOUNTER — Encounter: Payer: Self-pay | Admitting: Hematology and Oncology

## 2016-07-17 ENCOUNTER — Other Ambulatory Visit (HOSPITAL_BASED_OUTPATIENT_CLINIC_OR_DEPARTMENT_OTHER): Payer: Medicare HMO

## 2016-07-17 ENCOUNTER — Other Ambulatory Visit: Payer: Self-pay | Admitting: Hematology and Oncology

## 2016-07-17 ENCOUNTER — Ambulatory Visit (HOSPITAL_BASED_OUTPATIENT_CLINIC_OR_DEPARTMENT_OTHER): Payer: Medicare HMO | Admitting: Hematology and Oncology

## 2016-07-17 DIAGNOSIS — B259 Cytomegaloviral disease, unspecified: Secondary | ICD-10-CM | POA: Diagnosis not present

## 2016-07-17 DIAGNOSIS — C9001 Multiple myeloma in remission: Secondary | ICD-10-CM

## 2016-07-17 DIAGNOSIS — D469 Myelodysplastic syndrome, unspecified: Secondary | ICD-10-CM

## 2016-07-17 DIAGNOSIS — Z9481 Bone marrow transplant status: Secondary | ICD-10-CM

## 2016-07-17 DIAGNOSIS — R5381 Other malaise: Secondary | ICD-10-CM | POA: Diagnosis not present

## 2016-07-17 LAB — COMPREHENSIVE METABOLIC PANEL
ALT: 19 U/L (ref 0–55)
AST: 20 U/L (ref 5–34)
Albumin: 3.7 g/dL (ref 3.5–5.0)
Alkaline Phosphatase: 125 U/L (ref 40–150)
Anion Gap: 7 mEq/L (ref 3–11)
BUN: 15.3 mg/dL (ref 7.0–26.0)
CO2: 30 mEq/L — ABNORMAL HIGH (ref 22–29)
Calcium: 9.2 mg/dL (ref 8.4–10.4)
Chloride: 105 mEq/L (ref 98–109)
Creatinine: 0.8 mg/dL (ref 0.6–1.1)
EGFR: 73 mL/min/{1.73_m2} — ABNORMAL LOW (ref >90)
Glucose: 80 mg/dl (ref 70–140)
Potassium: 4.6 mEq/L (ref 3.5–5.1)
Sodium: 141 mEq/L (ref 136–145)
Total Bilirubin: 0.36 mg/dL (ref 0.20–1.20)
Total Protein: 6.2 g/dL — ABNORMAL LOW (ref 6.4–8.3)

## 2016-07-17 LAB — CBC WITH DIFFERENTIAL/PLATELET
BASO%: 0.4 % (ref 0.0–2.0)
Basophils Absolute: 0 10*3/uL (ref 0.0–0.1)
EOS%: 1.4 % (ref 0.0–7.0)
Eosinophils Absolute: 0.1 10*3/uL (ref 0.0–0.5)
HCT: 35.3 % (ref 34.8–46.6)
HGB: 12.1 g/dL (ref 11.6–15.9)
LYMPH%: 29.8 % (ref 14.0–49.7)
MCH: 36.4 pg — ABNORMAL HIGH (ref 25.1–34.0)
MCHC: 34.1 g/dL (ref 31.5–36.0)
MCV: 106.6 fL — ABNORMAL HIGH (ref 79.5–101.0)
MONO#: 0.5 10*3/uL (ref 0.1–0.9)
MONO%: 10.3 % (ref 0.0–14.0)
NEUT#: 2.7 10*3/uL (ref 1.5–6.5)
NEUT%: 58.1 % (ref 38.4–76.8)
Platelets: 155 10*3/uL (ref 145–400)
RBC: 3.31 10*6/uL — ABNORMAL LOW (ref 3.70–5.45)
RDW: 12.7 % (ref 11.2–14.5)
WBC: 4.7 10*3/uL (ref 3.9–10.3)
lymph#: 1.4 10*3/uL (ref 0.9–3.3)

## 2016-07-17 LAB — MAGNESIUM: Magnesium: 2.2 mg/dl (ref 1.5–2.5)

## 2016-07-17 NOTE — Assessment & Plan Note (Signed)
She is doing very well and has gained some weight She is starting on graduated exercise program at home Her leg edema has resolved and she feels stronger

## 2016-07-17 NOTE — Assessment & Plan Note (Signed)
I reviewed her outside records from [facility] She had mild persistent minimal residual disease She is not symptomatic I would defer to her oncologist at [facility] to decide when to start her on treatment For now, I think it is reasonable to observe with close monitoring of her blood work She remain on calcium with vitamin D supplement

## 2016-07-17 NOTE — Assessment & Plan Note (Signed)
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 Recent bone marrow biopsy showed no residual blasts and showed predominantly donor cells Continue supportive care She is off all immunosuppressive therapy with no signs of GVH 

## 2016-07-17 NOTE — Progress Notes (Signed)
[facility] OFFICE PROGRESS NOTE  Patient Care Team: Jinny Sanders, MD as PCP - General (Family Medicine) [name], MD as Referring Physician (Hematology and Oncology) [name], MD as Consulting Physician (Internal Medicine) Truman Hayward, MD as Consulting Physician (Infectious Diseases) Deanne Coffer An Nila Nephew, MD as Consulting Physician (Hematology and Oncology) [name], NP as Nurse Practitioner (Hematology and Oncology)  SUMMARY OF ONCOLOGIC HISTORY: Oncology History   Multiple myeloma, Ig A Lambda, M spike 3.54 grams, Calcium 9.2, Creatinine 0.8, Beta 2 microglobulin 4.52, IgA 4840 mg/dL, lambda light chain 75.4, albumin 3.6, hemoglobin 9.7, platelet 115    Primary site: Multiple Myeloma   Staging method: AJCC 6th Edition   Clinical: Stage IIA signed by [name], MD on 11/07/2013  2:46 PM   Summary: Stage IIA        Multiple myeloma in remission ([place])   10/31/2013 Bone Marrow Biopsy    Bone marrow biopsy confirmed multiple myeloma with 40% bone marrow involvement. Skeletal survey showed minimal lesions in her score with generalized demineralization      11/10/2013 - 02/13/2014 Chemotherapy    The patient is started on induction chemotherapy with weekly dexamethasone 40 mg by mouth as well as Velcade subcutaneous injection on days 1, 4, 8 and 11. On 11/21/2013, she was started on monthly Zometa.      12/23/2013 Adverse Reaction    The dose of Velcade was reduced due to thrombocytopenia.      01/28/2014 - 04/07/2014 Chemotherapy    Revlimid is added. Treatment was discontinued due to lack of response.      02/24/2014 - 04/07/2014 Chemotherapy    Due to worsening peripheral neuropathy, Velcade injection is changed to once a week. Revlimid was given 21 days on, 7 days off.      04/07/2014 - 04/10/2014 Chemotherapy    Revlimid was discontinued due to lack of response. Chemotherapy was changed back to Velcade injection twice a  week, 2 weeks on 1 week off. Her treatment was switched to to minimum response      04/20/2014 - 06/02/2014 Chemotherapy    chemotherapy is switched to Carfilzomib, Cytoxan and dexamethasone.      04/22/2014 Procedure    she has placement of port for chemotherapy.      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 [facility]      10/20/2014 - 03/24/2015 Chemotherapy     she received chemotherapy with Kyprolis, Revlimid and dexamethasone      10/22/2014 Procedure    She has port placement      01/19/2015 Tumor Marker    IgA lambda M spike at 0.4 g       01/20/2015 Miscellaneous    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 No cource was found and fever resolved with IV vancomycin and meropenem      09/13/2015 Bone Marrow Biopsy    Bone marrow biopsy showed no increased blasts, 3-4 % plasma cells      03/02/2016 Bone Marrow Biopsy    Bone marrow biopsy at [facility] showed normocellular (30%) bone marrow with trilineage hematopoiesis. No significant increase in blasts. No significant increase in plasma cells.       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[place])   04/06/2015 Bone Marrow Biopsy    Accession: [id number] BM biopsy showed RAEB-1      04/06/2015 Tumor Marker    Cytogenetics and FISH for MDS are within normal limits      10/06/2015 - 10/10/2015 Chemotherapy    She received conditioning chemotherapy with busulfan and melphalan      10/12/2015 Bone Marrow Transplant    She received allogenic stem cell transplant      10/19/2015 Adverse Reaction    She developed posttransplant complication with mucositis, viral infection with rhinovirus, neutropenic fever, bilateral pleural effusion and moderate pericardial effusion and CMV reactivation.      10/31/2015 Miscellaneous    She has engrafted       INTERVAL HISTORY: Please see below for problem oriented charting. She returns for follow-up She feels well She started exercise program at home Her leg edema has resolved She denies recent infection No recent diarrhea, nausea or vomiting She has gained some weight No recent bone pain  REVIEW OF SYSTEMS:   Constitutional: Denies fevers, chills or abnormal weight loss Eyes: Denies blurriness of vision Ears, nose, mouth, throat, and face: Denies mucositis or sore throat Respiratory: Denies cough, dyspnea or wheezes Cardiovascular: Denies palpitation, chest discomfort or lower extremity swelling Gastrointestinal:  Denies nausea, heartburn or change in bowel habits Skin: Denies abnormal skin rashes Lymphatics: Denies new lymphadenopathy or easy bruising Neurological:Denies numbness, tingling or new weaknesses Behavioral/Psych: Mood is stable, no new changes  All other systems were reviewed with the patient and are negative.  I have reviewed the past medical history, past surgical history, social history and family history with the patient and they are unchanged from previous note.  ALLERGIES:  has No Known Allergies.  MEDICATIONS:  Current Outpatient Prescriptions  Medication Sig Dispense Refill  .  acyclovir (ZOVIRAX) 400 MG tablet TAKE 1 TABLET (400 MG TOTAL) BY MOUTH 2 (TWO) TIMES DAILY. 180 tablet 2  . carvedilol (COREG) 6.25 MG tablet Take 6.25 mg by mouth 2 (two) times daily.    . Cholecalciferol (VITAMIN D-1000 MAX ST) 1000 units tablet Take 1,000 Units by mouth daily.    Marland Kitchen loperamide (IMODIUM) 2 MG capsule Take by mouth as needed for diarrhea or loose stools.    Marland Kitchen LORazepam (ATIVAN) 0.5 MG tablet Take 0.5 mg by mouth 2 (two) times daily as needed.    . Melatonin 300 MCG TABS Take 300 mcg by mouth at bedtime as needed.    . mirtazapine (REMERON) 30 MG tablet Take 1 tablet (30 mg total) by mouth at bedtime. 90 tablet 3  . Multiple Vitamin (MULTIVITAMIN WITH MINERALS) TABS tablet Take 1 tablet by mouth daily.    . Multiple  Vitamins-Minerals (ICAPS AREDS 2 PO) Take 1 tablet by mouth 2 (two) times daily.    . pantoprazole (PROTONIX) 40 MG tablet Take 40 mg by mouth every 12 (twelve) hours.    . prochlorperazine (COMPAZINE) 5 MG tablet Take 5 mg by mouth every 6 (six) hours as needed.    Marland Kitchen UNABLE TO FIND 2 (two) times daily. Med Name: Magnesium plus protein      No current facility-administered medications for this visit.     PHYSICAL EXAMINATION: ECOG PERFORMANCE STATUS: 0 - Asymptomatic  Vitals:   07/17/16 1318  BP: (!) 151/59  Pulse: 70  Resp: 16  Temp: 98 F (36.7 C)   Filed Weights   07/17/16 1318  Weight: 133 lb 11.2 oz (60.6 kg)    GENERAL:alert, no distress and comfortable SKIN: skin color, texture, turgor are normal, no rashes or significant lesions EYES: normal, Conjunctiva are pink and non-injected, sclera clear OROPHARYNX:no exudate, no erythema and lips, buccal mucosa, and tongue normal  NECK: supple, thyroid normal size, non-tender, without nodularity LYMPH:  no palpable lymphadenopathy in the cervical, axillary or inguinal LUNGS: clear to auscultation and percussion with normal breathing effort HEART: regular rate & rhythm and no murmurs and no lower  extremity edema ABDOMEN:abdomen soft, non-tender and normal bowel sounds Musculoskeletal:no cyanosis of digits and no clubbing  NEURO: alert & oriented x 3 with fluent speech, no focal motor/sensory deficits  LABORATORY DATA:  I have reviewed the data as listed    Component Value Date/Time   NA 141 07/17/2016 1303   K 4.6 07/17/2016 1303   CL 106 08/06/2015 0530   CO2 30 (H) 07/17/2016 1303   GLUCOSE 80 07/17/2016 1303   BUN 15.3 07/17/2016 1303   CREATININE 0.8 07/17/2016 1303   CALCIUM 9.2 07/17/2016 1303   PROT 6.2 (L) 07/17/2016 1303   ALBUMIN 3.7 07/17/2016 1303   AST 20 07/17/2016 1303   ALT 19 07/17/2016 1303   ALKPHOS 125 07/17/2016 1303   BILITOT 0.36 07/17/2016 1303   GFRNONAA >60 08/06/2015 0530   GFRAA >60 08/06/2015 0530    No results found for: SPEP, UPEP  Lab Results  Component Value Date   WBC 4.7 07/17/2016   NEUTROABS 2.7 07/17/2016   HGB 12.1 07/17/2016   HCT 35.3 07/17/2016   MCV 106.6 (H) 07/17/2016   PLT 155 07/17/2016      Chemistry      Component Value Date/Time   NA 141 07/17/2016 1303   K 4.6 07/17/2016 1303   CL 106 08/06/2015 0530   CO2 30 (H) 07/17/2016 1303   BUN 15.3 07/17/2016 1303   CREATININE 0.8 07/17/2016 1303      Component Value Date/Time   CALCIUM 9.2 07/17/2016 1303   ALKPHOS 125 07/17/2016 1303   AST 20 07/17/2016 1303   ALT 19 07/17/2016 1303   BILITOT 0.36 07/17/2016 1303      ASSESSMENT & PLAN:  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 Recent bone marrow biopsy showed no residual blasts and showed predominantly donor cells Continue supportive care She is off all immunosuppressive therapy with no signs of GVH  Multiple myeloma in remission ([place]) I reviewed her outside records from [facility] She had mild persistent minimal residual disease She is not symptomatic I would defer to her oncologist at [facility] to decide when to start her on treatment For now, I think it is  reasonable to observe with close monitoring of her blood work She remain on calcium  with vitamin D supplement  Physical deconditioning She is doing very well and has gained some weight She is starting on graduated exercise program at home Her leg edema has resolved and she feels stronger   No orders of the defined types were placed in this encounter.  All questions were answered. The patient knows to call the clinic with any problems, questions or concerns. No barriers to learning was detected. I spent 15 minutes counseling the patient face to face. The total time spent in the appointment was 20 minutes and more than 50% was on counseling and review of test results     [name], MD 07/17/2016 1:45 PM

## 2016-07-21 DIAGNOSIS — I313 Pericardial effusion (noninflammatory): Secondary | ICD-10-CM | POA: Diagnosis not present

## 2016-07-21 DIAGNOSIS — I517 Cardiomegaly: Secondary | ICD-10-CM | POA: Diagnosis not present

## 2016-07-21 DIAGNOSIS — I5189 Other ill-defined heart diseases: Secondary | ICD-10-CM | POA: Diagnosis not present

## 2016-07-21 DIAGNOSIS — Z9481 Bone marrow transplant status: Secondary | ICD-10-CM | POA: Diagnosis not present

## 2016-07-21 LAB — CMV DNA, QUANTITATIVE, PCR: CMV Quant DNA PCR (Plasma): NEGATIVE IU/mL

## 2016-07-24 DIAGNOSIS — Z9484 Stem cells transplant status: Secondary | ICD-10-CM | POA: Diagnosis not present

## 2016-07-24 DIAGNOSIS — Z9481 Bone marrow transplant status: Secondary | ICD-10-CM | POA: Diagnosis not present

## 2016-07-24 DIAGNOSIS — C9000 Multiple myeloma not having achieved remission: Secondary | ICD-10-CM | POA: Diagnosis not present

## 2016-07-24 DIAGNOSIS — D469 Myelodysplastic syndrome, unspecified: Secondary | ICD-10-CM | POA: Diagnosis not present

## 2016-07-28 ENCOUNTER — Encounter: Payer: Self-pay | Admitting: Hematology and Oncology

## 2016-07-31 ENCOUNTER — Telehealth: Payer: Self-pay | Admitting: Hematology and Oncology

## 2016-07-31 NOTE — Telephone Encounter (Signed)
sw pt to confirm 3/12 appt at 930 am per LOS

## 2016-08-07 ENCOUNTER — Encounter: Payer: Self-pay | Admitting: Hematology and Oncology

## 2016-08-10 ENCOUNTER — Encounter: Payer: Self-pay | Admitting: Hematology and Oncology

## 2016-08-29 ENCOUNTER — Ambulatory Visit (INDEPENDENT_AMBULATORY_CARE_PROVIDER_SITE_OTHER): Payer: Medicare HMO | Admitting: Vascular Surgery

## 2016-08-29 ENCOUNTER — Ambulatory Visit (INDEPENDENT_AMBULATORY_CARE_PROVIDER_SITE_OTHER): Payer: Medicare HMO

## 2016-08-29 ENCOUNTER — Other Ambulatory Visit (INDEPENDENT_AMBULATORY_CARE_PROVIDER_SITE_OTHER): Payer: Self-pay | Admitting: Vascular Surgery

## 2016-08-29 ENCOUNTER — Encounter (INDEPENDENT_AMBULATORY_CARE_PROVIDER_SITE_OTHER): Payer: Self-pay | Admitting: Vascular Surgery

## 2016-08-29 VITALS — BP 124/62 | HR 71 | Resp 16 | Wt 142.0 lb

## 2016-08-29 DIAGNOSIS — C9001 Multiple myeloma in remission: Secondary | ICD-10-CM | POA: Diagnosis not present

## 2016-08-29 DIAGNOSIS — Z9481 Bone marrow transplant status: Secondary | ICD-10-CM

## 2016-08-29 DIAGNOSIS — I10 Essential (primary) hypertension: Secondary | ICD-10-CM | POA: Diagnosis not present

## 2016-08-29 DIAGNOSIS — I6523 Occlusion and stenosis of bilateral carotid arteries: Secondary | ICD-10-CM

## 2016-08-29 NOTE — Progress Notes (Signed)
MRN : [id number]  Jean Davidson is a 71 y.o. ([birth year]) female who presents with chief complaint of  Chief Complaint  Patient presents with  . Follow-up  .  History of Present Illness: Patient returns in follow up today of her carotid disease.  She has gotten another transplant since her last visit, and has tolerated that well. She has no specific complaints today. She denies focal neurologic symptoms. Specifically, the patient denies amaurosis fugax, speech or swallowing difficulties, or arm or leg weakness or numbness. Her carotid duplex today reveals stable, 1-39% bilateral carotid artery stenosis.  Current Outpatient Prescriptions  Medication Sig Dispense Refill  . acyclovir (ZOVIRAX) 400 MG tablet TAKE 1 TABLET (400 MG TOTAL) BY MOUTH 2 (TWO) TIMES DAILY. 180 tablet 2  . carvedilol (COREG) 6.25 MG tablet Take 6.25 mg by mouth 2 (two) times daily.    . Cholecalciferol (VITAMIN D-1000 MAX ST) 1000 units tablet Take 1,000 Units by mouth daily.    Marland Kitchen loperamide (IMODIUM) 2 MG capsule Take by mouth as needed for diarrhea or loose stools.    Marland Kitchen LORazepam (ATIVAN) 0.5 MG tablet Take 0.5 mg by mouth 2 (two) times daily as needed.    . mirtazapine (REMERON) 30 MG tablet Take 1 tablet (30 mg total) by mouth at bedtime. 90 tablet 3  . Multiple Vitamin (MULTIVITAMIN WITH MINERALS) TABS tablet Take 1 tablet by mouth daily.    . Multiple Vitamins-Minerals (ICAPS AREDS 2 PO) Take 1 tablet by mouth 2 (two) times daily.    . pantoprazole (PROTONIX) 40 MG tablet Take 40 mg by mouth every 12 (twelve) hours.    Marland Kitchen UNABLE TO FIND 2 (two) times daily. Med Name: Magnesium plus protein     . Melatonin 300 MCG TABS Take 300 mcg by mouth at bedtime as needed.    . prochlorperazine (COMPAZINE) 5 MG tablet Take 5 mg by mouth every 6 (six) hours as needed.     No current facility-administered medications for this visit.     Past Medical History:  Diagnosis Date  . Anemia   . Anxiety   . Cancer ([place])   .  Carotid stenosis    Mild bilateral  . Carpal tunnel syndrome   . Cervical disc disease    c7  . Cough 07/27/2015  . Disc degeneration   . Dysuria 07/26/2015  . Failure of stem cell transplant ([place])   . Fatigue 01/26/2015  . Fever 07/27/2015  . GERD (gastroesophageal reflux disease)   . 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 Hypertension    Borderline.  . Hypokalemia 07/16/2015  . Pinched nerve   . Sinusitis, bacterial 07/27/2015  . Spinal stenosis in cervical region   . Varicose veins of lower extremities with inflammation     Past Surgical History:  Procedure Laterality Date  . bladder tact  1988/1989   x2  . COLONOSCOPY    . PORT-A-CATH REMOVAL    . SHOULDER SURGERY Right 04/06/13   right    Social History Social History  Substance Use Topics  . Smoking status: Former Smoker    Packs/day: 0.50    Years: 4.00    Quit date: 06/26/1968  . Smokeless tobacco: Never Used  . Alcohol use 0.6 oz/week    1 Cans of beer per week     Comment: minimal  No IVDU  Family History Family History  Problem Relation Age of Onset  . Heart disease Mother   . Heart failure Father   .  Heart disease Father   . Throat cancer Father   . Skin cancer Father   . Diabetes Father   . Kidney disease Father   . Hypertension Brother   . Heart disease Brother     congenital shunt, ?   . Colon cancer Paternal Aunt 53  . Skin cancer Brother      No Known Allergies   REVIEW OF SYSTEMS (Negative unless checked)  Constitutional: [] Weight loss  [] Fever  [] Chills Cardiac: [] Chest pain   [] Chest pressure   [] Palpitations   [] Shortness of breath when laying flat   [] Shortness of breath at rest   [] Shortness of breath with exertion. Vascular:  [] Pain in legs with walking   [] Pain in legs at rest   [] Pain in legs when laying flat   [] Claudication   [] Pain in feet when walking  [] Pain in feet at rest  [] Pain in feet when laying flat   [] History of DVT   [] Phlebitis   [] Swelling in legs   [] Varicose veins    [] Non-healing ulcers Pulmonary:   [] Uses home oxygen   [] Productive cough   [] Hemoptysis   [] Wheeze  [] COPD   [] Asthma Neurologic:  [] Dizziness  [] Blackouts   [] Seizures   [] History of stroke   [] History of TIA  [] Aphasia   [] Temporary blindness   [] Dysphagia   [] Weakness or numbness in arms   [x] Weakness or numbness in legs Musculoskeletal:  [] Arthritis   [] Joint swelling   [] Joint pain   [] Low back pain Hematologic:  [] Easy bruising  [] Easy bleeding   [] Hypercoagulable state   [] Anemic  [] Hepatitis Gastrointestinal:  [] Blood in stool   [] Vomiting blood  [] Gastroesophageal reflux/heartburn   [] Difficulty swallowing. Genitourinary:  [x] Chronic kidney disease   [] Difficult urination  [] Frequent urination  [] Burning with urination   [] Blood in urine Skin:  [] Rashes   [] Ulcers   [] Wounds Psychological:  [] History of anxiety   []  History of major depression.  Physical Examination  Vitals:   08/29/16 0939  BP: 124/62  Pulse: 71  Resp: 16  Weight: 142 lb (64.4 kg)   Body mass index is 25.15 kg/m. Gen:  WD/WN, NAD Head: [place]/AT, No temporalis wasting. Ear/Nose/Throat: Hearing grossly intact, nares w/o erythema or drainage, trachea midline Eyes: Conjunctiva clear. Sclera non-icteric Neck: Supple.  No bruit or JVD.  Pulmonary:  Good air movement, equal and clear to auscultation bilaterally.  Cardiac: RRR, normal S1, S2, no Murmurs, rubs or gallops. Vascular:  Vessel Right Left  Radial Palpable Palpable                                   Gastrointestinal: soft, non-tender/non-distended. No guarding/reflex.  Musculoskeletal: M/S 5/5 throughout.  No deformity or atrophy. 1+ bilateral lower extremity edema. Neurologic: CN 2-12 intact. Sensation grossly intact in extremities.  Symmetrical.  Speech is fluent. Motor exam as listed above. Psychiatric: Judgment intact, Mood & affect appropriate for pt's clinical situation. Dermatologic: No rashes or ulcers noted.  No cellulitis or open  wounds. Lymph : No Cervical, Axillary, or Inguinal lymphadenopathy.     CBC Lab Results  Component Value Date   WBC 4.7 07/17/2016   HGB 12.1 07/17/2016   HCT 35.3 07/17/2016   MCV 106.6 (H) 07/17/2016   PLT 155 07/17/2016    BMET    Component Value Date/Time   NA 141 07/17/2016 1303   K 4.6 07/17/2016 1303   CL 106 08/06/2015 0530  CO2 30 (H) 07/17/2016 1303   GLUCOSE 80 07/17/2016 1303   BUN 15.3 07/17/2016 1303   CREATININE 0.8 07/17/2016 1303   CALCIUM 9.2 07/17/2016 1303   GFRNONAA >60 08/06/2015 0530   GFRAA >60 08/06/2015 0530   CrCl cannot be calculated (Patient's most recent lab result is older than the maximum 21 days allowed.).  COAG Lab Results  Component Value Date   INR 1.44 08/04/2015   INR 1.08 10/22/2014   INR 1.12 04/23/2014    Radiology No results found.    Assessment/Plan Essential hypertension blood pressure control important in reducing the progression of atherosclerotic disease. On appropriate oral medications.   Carotid stenosis Her carotid duplex today reveals stable, 1-39% bilateral carotid artery stenosis. This is unchanged from her study last year. Continue aspirin. Plan to recheck in 1 year with carotid duplex.    Leotis Pain, MD  08/29/2016 10:22 AM    This note was created with Dragon medical transcription system.  Any errors from dictation are purely unintentional

## 2016-08-29 NOTE — Patient Instructions (Signed)
Carotid Artery Disease The carotid arteries are arteries on both sides of the neck. They carry blood to the brain. Carotid artery disease is when the arteries get smaller (narrow) or get blocked. If these arteries get smaller or get blocked, you are more likely to have a stroke or warning stroke (transient ischemic attack). Follow these instructions at home:  Take medicines as told by your doctor. Make sure you understand all your medicine instructions. Do not stop your medicines without talking to your doctor first.  Follow your doctor's diet instructions. It is important to eat a healthy diet that includes plenty of:  Fresh fruits.  Vegetables.  Lean meats.  Avoid:  High-fat foods.  High-sodium foods.  Foods that are fried, overly processed, or have poor nutritional value.  Stay a healthy weight.  Stay active. Get at least 30 minutes of activity every day.  Do not smoke.  Limit alcohol use to:  No more than 2 drinks a day for men.  No more than 1 drink a day for women who are not pregnant.  Do not use illegal drugs.  Keep all doctor visits as told. Get help right away if:  You have sudden weakness or loss of feeling (numbness) on one side of the body, such as the face, arm, or leg.  You have sudden confusion.  You have trouble speaking (aphasia) or understanding.  You have sudden trouble seeing out of one or both eyes.  You have sudden trouble walking.  You have dizziness or feel like you might pass out (faint).  You have a loss of balance or your movements are not steady (uncoordinated).  You have a sudden, severe headache with no known cause.  You have trouble swallowing (dysphagia). Call your local emergency services (911 in [place] Do notdrive yourself to the clinic or hospital. This information is not intended to replace advice given to you by your health care provider. Make sure you discuss any questions you have with your health care  provider. Document Released: 05/29/2012 Document Revised: 11/18/2015 Document Reviewed: 12/11/2012 Elsevier Interactive Patient Education  2017 [employer]vier Inc.  

## 2016-08-29 NOTE — Assessment & Plan Note (Signed)
Her carotid duplex today reveals stable, 1-39% bilateral carotid artery stenosis. This is unchanged from her study last year. Continue aspirin. Plan to recheck in 1 year with carotid duplex.

## 2016-08-29 NOTE — Assessment & Plan Note (Signed)
blood pressure control important in reducing the progression of atherosclerotic disease. On appropriate oral medications.  

## 2016-09-04 ENCOUNTER — Other Ambulatory Visit (HOSPITAL_BASED_OUTPATIENT_CLINIC_OR_DEPARTMENT_OTHER): Payer: Medicare HMO

## 2016-09-04 ENCOUNTER — Ambulatory Visit (HOSPITAL_BASED_OUTPATIENT_CLINIC_OR_DEPARTMENT_OTHER): Payer: Medicare HMO | Admitting: Hematology and Oncology

## 2016-09-04 ENCOUNTER — Encounter: Payer: Self-pay | Admitting: Hematology and Oncology

## 2016-09-04 ENCOUNTER — Telehealth: Payer: Self-pay | Admitting: Hematology and Oncology

## 2016-09-04 DIAGNOSIS — C9001 Multiple myeloma in remission: Secondary | ICD-10-CM | POA: Diagnosis not present

## 2016-09-04 DIAGNOSIS — D469 Myelodysplastic syndrome, unspecified: Secondary | ICD-10-CM

## 2016-09-04 DIAGNOSIS — B259 Cytomegaloviral disease, unspecified: Secondary | ICD-10-CM

## 2016-09-04 DIAGNOSIS — Z9481 Bone marrow transplant status: Secondary | ICD-10-CM

## 2016-09-04 LAB — CBC WITH DIFFERENTIAL/PLATELET
BASO%: 0.2 % (ref 0.0–2.0)
Basophils Absolute: 0 10*3/uL (ref 0.0–0.1)
EOS%: 1.9 % (ref 0.0–7.0)
Eosinophils Absolute: 0.1 10*3/uL (ref 0.0–0.5)
HCT: 38.5 % (ref 34.8–46.6)
HGB: 13.3 g/dL (ref 11.6–15.9)
LYMPH%: 22 % (ref 14.0–49.7)
MCH: 36.2 pg — ABNORMAL HIGH (ref 25.1–34.0)
MCHC: 34.6 g/dL (ref 31.5–36.0)
MCV: 104.6 fL — ABNORMAL HIGH (ref 79.5–101.0)
MONO#: 0.6 10*3/uL (ref 0.1–0.9)
MONO%: 11.1 % (ref 0.0–14.0)
NEUT#: 3.3 10*3/uL (ref 1.5–6.5)
NEUT%: 64.8 % (ref 38.4–76.8)
Platelets: 154 10*3/uL (ref 145–400)
RBC: 3.68 10*6/uL — ABNORMAL LOW (ref 3.70–5.45)
RDW: 12.5 % (ref 11.2–14.5)
WBC: 5.1 10*3/uL (ref 3.9–10.3)
lymph#: 1.1 10*3/uL (ref 0.9–3.3)

## 2016-09-04 LAB — COMPREHENSIVE METABOLIC PANEL
ALT: 26 U/L (ref 0–55)
AST: 27 U/L (ref 5–34)
Albumin: 3.7 g/dL (ref 3.5–5.0)
Alkaline Phosphatase: 112 U/L (ref 40–150)
Anion Gap: 6 mEq/L (ref 3–11)
BUN: 16.4 mg/dL (ref 7.0–26.0)
CO2: 32 mEq/L — ABNORMAL HIGH (ref 22–29)
Calcium: 9.5 mg/dL (ref 8.4–10.4)
Chloride: 104 mEq/L (ref 98–109)
Creatinine: 0.8 mg/dL (ref 0.6–1.1)
EGFR: 73 mL/min/{1.73_m2} — ABNORMAL LOW (ref >90)
Glucose: 86 mg/dl (ref 70–140)
Potassium: 4.1 mEq/L (ref 3.5–5.1)
Sodium: 141 mEq/L (ref 136–145)
Total Bilirubin: 0.45 mg/dL (ref 0.20–1.20)
Total Protein: 6.3 g/dL — ABNORMAL LOW (ref 6.4–8.3)

## 2016-09-04 LAB — MAGNESIUM: Magnesium: 2.1 mg/dl (ref 1.5–2.5)

## 2016-09-04 NOTE — Assessment & Plan Note (Signed)
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 Recent bone marrow biopsy showed no residual blasts and showed predominantly donor cells Continue supportive care She is off all immunosuppressive therapy with no signs of GVH

## 2016-09-04 NOTE — Progress Notes (Signed)
[facility] OFFICE PROGRESS NOTE  Patient Care Team: Jinny Sanders, MD as PCP - General (Family Medicine) [name], MD as Referring Physician (Hematology and Oncology) [name], MD as Consulting Physician (Internal Medicine) Truman Hayward, MD as Consulting Physician (Infectious Diseases) Deanne Coffer An Nila Nephew, MD as Consulting Physician (Hematology and Oncology) [name], NP as Nurse Practitioner (Hematology and Oncology)  SUMMARY OF ONCOLOGIC HISTORY: Oncology History   Multiple myeloma, Ig A Lambda, M spike 3.54 grams, Calcium 9.2, Creatinine 0.8, Beta 2 microglobulin 4.52, IgA 4840 mg/dL, lambda light chain 75.4, albumin 3.6, hemoglobin 9.7, platelet 115    Primary site: Multiple Myeloma   Staging method: AJCC 6th Edition   Clinical: Stage IIA signed by [name], MD on 11/07/2013  2:46 PM   Summary: Stage IIA        Multiple myeloma in remission ([place])   10/31/2013 Bone Marrow Biopsy    Bone marrow biopsy confirmed multiple myeloma with 40% bone marrow involvement. Skeletal survey showed minimal lesions in her score with generalized demineralization      11/10/2013 - 02/13/2014 Chemotherapy    The patient is started on induction chemotherapy with weekly dexamethasone 40 mg by mouth as well as Velcade subcutaneous injection on days 1, 4, 8 and 11. On 11/21/2013, she was started on monthly Zometa.      12/23/2013 Adverse Reaction    The dose of Velcade was reduced due to thrombocytopenia.      01/28/2014 - 04/07/2014 Chemotherapy    Revlimid is added. Treatment was discontinued due to lack of response.      02/24/2014 - 04/07/2014 Chemotherapy    Due to worsening peripheral neuropathy, Velcade injection is changed to once a week. Revlimid was given 21 days on, 7 days off.      04/07/2014 - 04/10/2014 Chemotherapy    Revlimid was discontinued due to lack of response. Chemotherapy was changed back to Velcade injection twice a  week, 2 weeks on 1 week off. Her treatment was switched to to minimum response      04/20/2014 - 06/02/2014 Chemotherapy    chemotherapy is switched to Carfilzomib, Cytoxan and dexamethasone.      04/22/2014 Procedure    she has placement of port for chemotherapy.      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 [facility]      10/20/2014 - 03/24/2015 Chemotherapy     she received chemotherapy with Kyprolis, Revlimid and dexamethasone      10/22/2014 Procedure    She has port placement      01/19/2015 Tumor Marker    IgA lambda M spike at 0.4 g       01/20/2015 Miscellaneous    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 No cource was found and fever resolved with IV vancomycin and meropenem      09/13/2015 Bone Marrow Biopsy    Bone marrow biopsy showed no increased blasts, 3-4 % plasma cells      03/02/2016 Bone Marrow Biopsy    Bone marrow biopsy at [facility] showed normocellular (30%) bone marrow with trilineage hematopoiesis. No significant increase in blasts. No significant increase in plasma cells.       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[place])   04/06/2015 Bone Marrow Biopsy    Accession: [id number] BM biopsy showed RAEB-1      04/06/2015 Tumor Marker    Cytogenetics and FISH for MDS are within normal limits      10/06/2015 - 10/10/2015 Chemotherapy    She received conditioning chemotherapy with busulfan and melphalan      10/12/2015 Bone Marrow Transplant    She received allogenic stem cell transplant      10/19/2015 Adverse Reaction    She developed posttransplant complication with mucositis, viral infection with rhinovirus, neutropenic fever, bilateral pleural effusion and moderate pericardial effusion and CMV reactivation.      10/31/2015 Miscellaneous    She has engrafted       INTERVAL HISTORY: Please see below for problem oriented charting. She returns for further follow-up. She expressed dissatisfaction with communication with the [facility] transplant service. Otherwise she feels good. She is gaining healthy weight. She denies recent infection.  She has very mild trace peripheral neuropathy, perceived at nighttime only  REVIEW OF SYSTEMS:   Constitutional: Denies fevers, chills or abnormal weight loss Eyes: Denies blurriness of vision Ears, nose, mouth, throat, and face: Denies mucositis or sore throat Respiratory: Denies cough, dyspnea or wheezes Cardiovascular: Denies palpitation, chest discomfort or lower extremity swelling Gastrointestinal:  Denies nausea, heartburn or change in bowel habits Skin: Denies abnormal skin rashes Lymphatics: Denies new lymphadenopathy or easy bruising Neurological:Denies numbness, tingling or new weaknesses Behavioral/Psych: Mood is stable, no new changes  All other systems were reviewed with the patient and are negative.  I have reviewed the past medical history, past surgical history, social history and family history with the patient and they are unchanged from previous note.  ALLERGIES:  has No Known Allergies.  MEDICATIONS:  Current  Outpatient Prescriptions  Medication Sig Dispense Refill  . acyclovir (ZOVIRAX) 400 MG tablet TAKE 1 TABLET (400 MG TOTAL) BY MOUTH 2 (TWO) TIMES DAILY. 180 tablet 2  . carvedilol (COREG) 6.25 MG tablet Take 6.25 mg by mouth 2 (two) times daily.    . Cholecalciferol (VITAMIN D-1000 MAX ST) 1000 units tablet Take 1,000 Units by mouth daily.    Marland Kitchen loperamide (IMODIUM) 2 MG capsule Take by mouth as needed for diarrhea or loose stools.    . mirtazapine (REMERON) 30 MG tablet Take 1 tablet (30 mg total) by mouth at bedtime. 90 tablet 3  . Multiple Vitamin (MULTIVITAMIN WITH MINERALS) TABS tablet Take 1 tablet by mouth daily.    . Multiple Vitamins-Minerals (ICAPS AREDS 2 PO) Take 1 tablet by mouth 2 (two) times daily.    . pantoprazole (PROTONIX) 40 MG tablet Take 40 mg by mouth every 12 (twelve) hours.    Marland Kitchen  UNABLE TO FIND 2 (two) times daily. Med Name: Magnesium plus protein     . LORazepam (ATIVAN) 0.5 MG tablet Take 0.5 mg by mouth 2 (two) times daily as needed.    . Melatonin 300 MCG TABS Take 300 mcg by mouth at bedtime as needed.    . prochlorperazine (COMPAZINE) 5 MG tablet Take 5 mg by mouth every 6 (six) hours as needed.     No current facility-administered medications for this visit.     PHYSICAL EXAMINATION: ECOG PERFORMANCE STATUS: 0 - Asymptomatic  Vitals:   09/04/16 0950  BP: (!) 149/68  Pulse: 82  Resp: 18  Temp: 98.5 F (36.9 C)   Filed Weights   09/04/16 0950  Weight: 144 lb 14.4 oz (65.7 kg)    GENERAL:alert, no distress and comfortable SKIN: skin color, texture, turgor are normal, no rashes or significant lesions EYES: normal, Conjunctiva are pink and non-injected, sclera clear OROPHARYNX:no exudate, no erythema and lips, buccal mucosa, and tongue normal  NECK: supple, thyroid normal size, non-tender, without nodularity LYMPH:  no palpable lymphadenopathy in the cervical, axillary or inguinal LUNGS: clear to auscultation and percussion with normal breathing  effort HEART: regular rate & rhythm and no murmurs and no lower extremity edema ABDOMEN:abdomen soft, non-tender and normal bowel sounds Musculoskeletal:no cyanosis of digits and no clubbing  NEURO: alert & oriented x 3 with fluent speech, no focal motor/sensory deficits  LABORATORY DATA:  I have reviewed the data as listed    Component Value Date/Time   NA 141 09/04/2016 0932   K 4.1 09/04/2016 0932   CL 106 08/06/2015 0530   CO2 32 (H) 09/04/2016 0932   GLUCOSE 86 09/04/2016 0932   BUN 16.4 09/04/2016 0932   CREATININE 0.8 09/04/2016 0932   CALCIUM 9.5 09/04/2016 0932   PROT 6.3 (L) 09/04/2016 0932   ALBUMIN 3.7 09/04/2016 0932   AST 27 09/04/2016 0932   ALT 26 09/04/2016 0932   ALKPHOS 112 09/04/2016 0932   BILITOT 0.45 09/04/2016 0932   GFRNONAA >60 08/06/2015 0530   GFRAA >60 08/06/2015 0530    No results found for: SPEP, UPEP  Lab Results  Component Value Date   WBC 5.1 09/04/2016   NEUTROABS 3.3 09/04/2016   HGB 13.3 09/04/2016   HCT 38.5 09/04/2016   MCV 104.6 (H) 09/04/2016   PLT 154 09/04/2016      Chemistry      Component Value Date/Time   NA 141 09/04/2016 0932   K 4.1 09/04/2016 0932   CL 106 08/06/2015 0530   CO2 32 (H) 09/04/2016 0932   BUN 16.4 09/04/2016 0932   CREATININE 0.8 09/04/2016 0932      Component Value Date/Time   CALCIUM 9.5 09/04/2016 0932   ALKPHOS 112 09/04/2016 0932   AST 27 09/04/2016 0932   ALT 26 09/04/2016 0932   BILITOT 0.45 09/04/2016 0932      ASSESSMENT & PLAN:  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 Recent bone marrow biopsy showed no residual blasts and showed predominantly donor cells Continue supportive care She is off all immunosuppressive therapy with no signs of GVH  Multiple myeloma in remission (HCC) I reviewed her outside records from [facility] She had mild persistent minimal residual disease detectable in blood work only She is not symptomatic I would defer to  her oncologist at [facility] to decide when to start her on treatment For now, I think it is reasonable to observe with close monitoring of her  blood work She remain on calcium with vitamin D supplement  Bone marrow transplant status Per instructions from her hematologist at [facility], she has stopped all immunosuppressants and antimicrobial prophylaxis. Continue supportive care.   No orders of the defined types were placed in this encounter.  All questions were answered. The patient knows to call the clinic with any problems, questions or concerns. No barriers to learning was detected. I spent 15 minutes counseling the patient face to face. The total time spent in the appointment was 20 minutes and more than 50% was on counseling and review of test results     [name], MD 09/04/2016 12:51 PM

## 2016-09-04 NOTE — Assessment & Plan Note (Signed)
Per instructions from her hematologist at [facility], she has stopped all immunosuppressants and antimicrobial prophylaxis. Continue supportive care.

## 2016-09-04 NOTE — Telephone Encounter (Signed)
Gave patient AVS and calender per 09/04/2016 los

## 2016-09-04 NOTE — Assessment & Plan Note (Signed)
I reviewed her outside records from [facility] She had mild persistent minimal residual disease detectable in blood work only She is not symptomatic I would defer to her oncologist at [facility] to decide when to start her on treatment For now, I think it is reasonable to observe with close monitoring of her blood work She remain on calcium with vitamin D supplement

## 2016-09-06 ENCOUNTER — Telehealth: Payer: Self-pay | Admitting: *Deleted

## 2016-09-06 LAB — CMV DNA, QUANTITATIVE, PCR: CMV Quant DNA PCR (Plasma): NEGATIVE IU/mL

## 2016-09-06 NOTE — Telephone Encounter (Signed)
Notified of message below

## 2016-09-06 NOTE — Telephone Encounter (Signed)
-----   Message from [name], MD sent at 09/06/2016  2:52 PM EDT ----- Regarding: tests PLs let her know other tests are OK ----- Message ----- From: Interface, Lab In Three Zero One Sent: 09/04/2016   9:59 AM To: [name], MD

## 2016-09-25 IMAGING — CR DG CHEST 2V
2 series · 2 of 2 positions shown · non-contrast
Comparison: Chest x-ray of January 03, 2015

CLINICAL DATA: New onset of cough and fever and malaise; history of
multiple myeloma in remission.

EXAM:
CHEST  2 VIEW

[w chest pa]
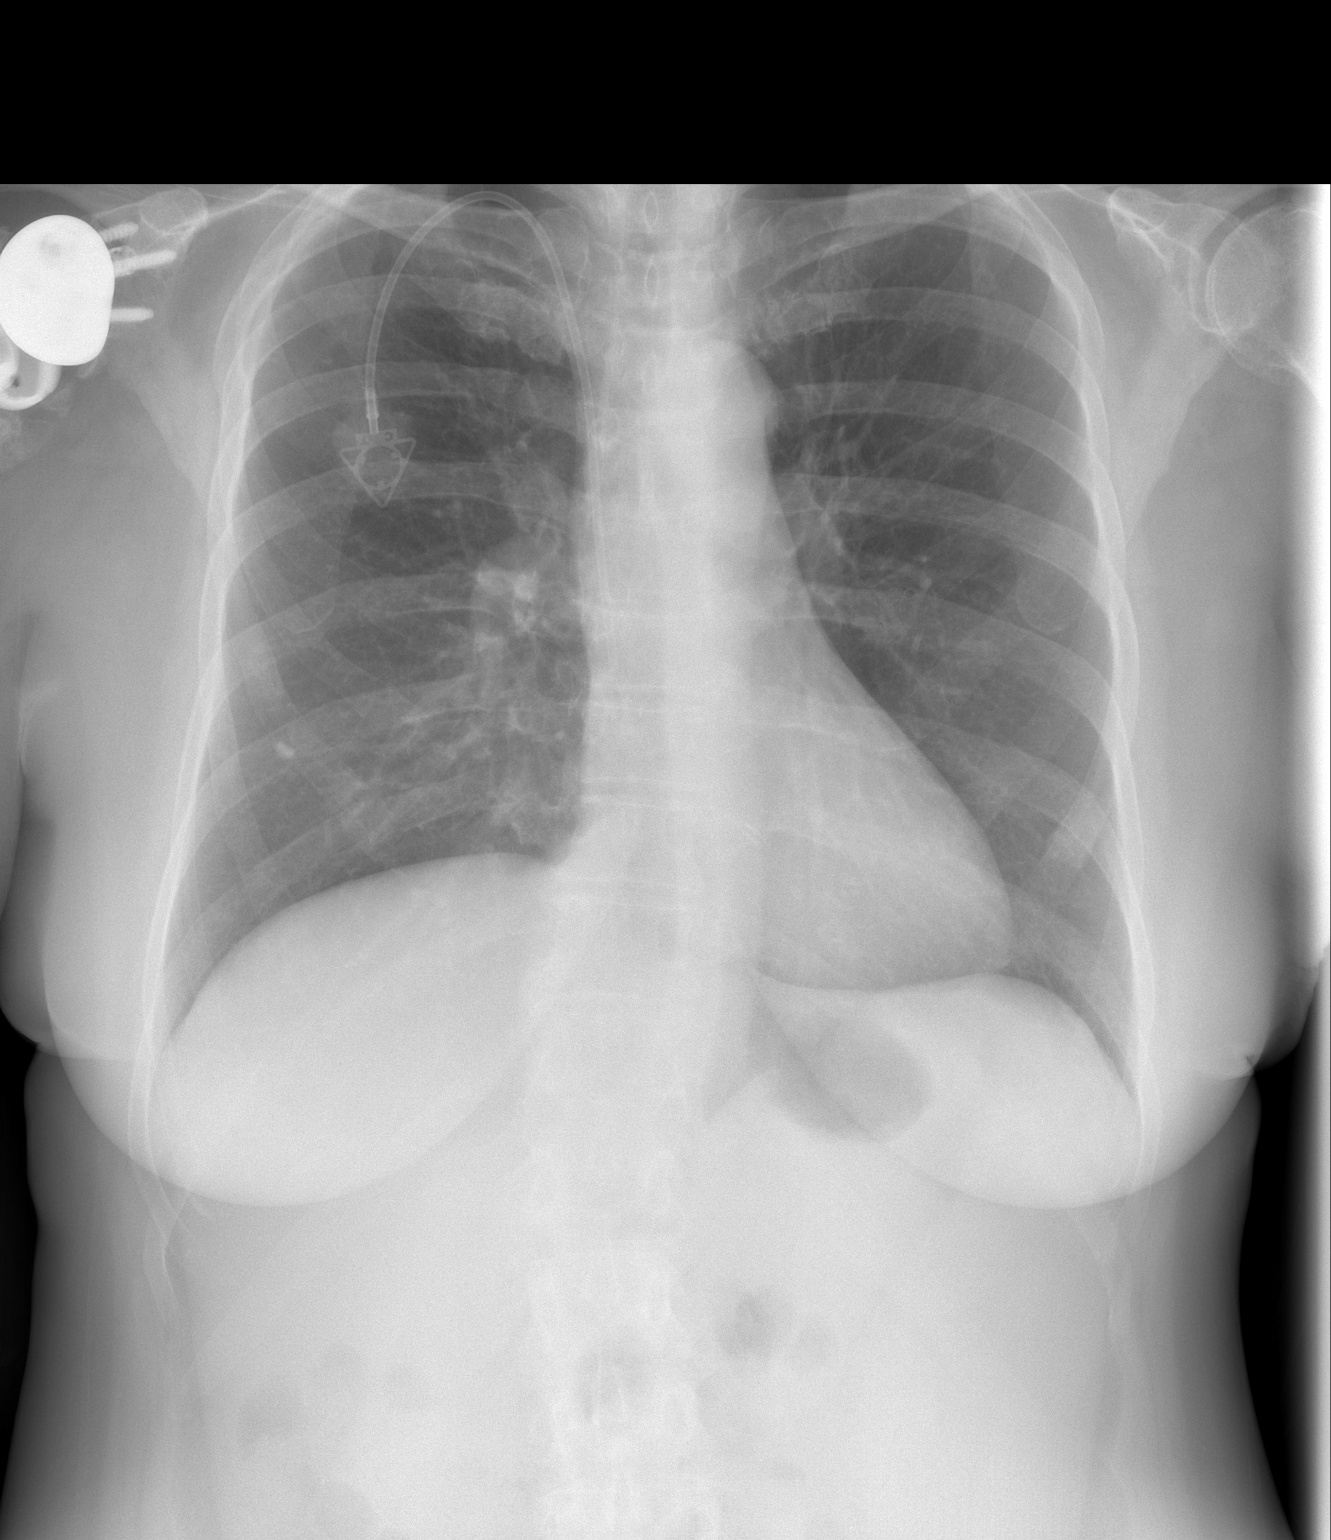

[w chest lat]
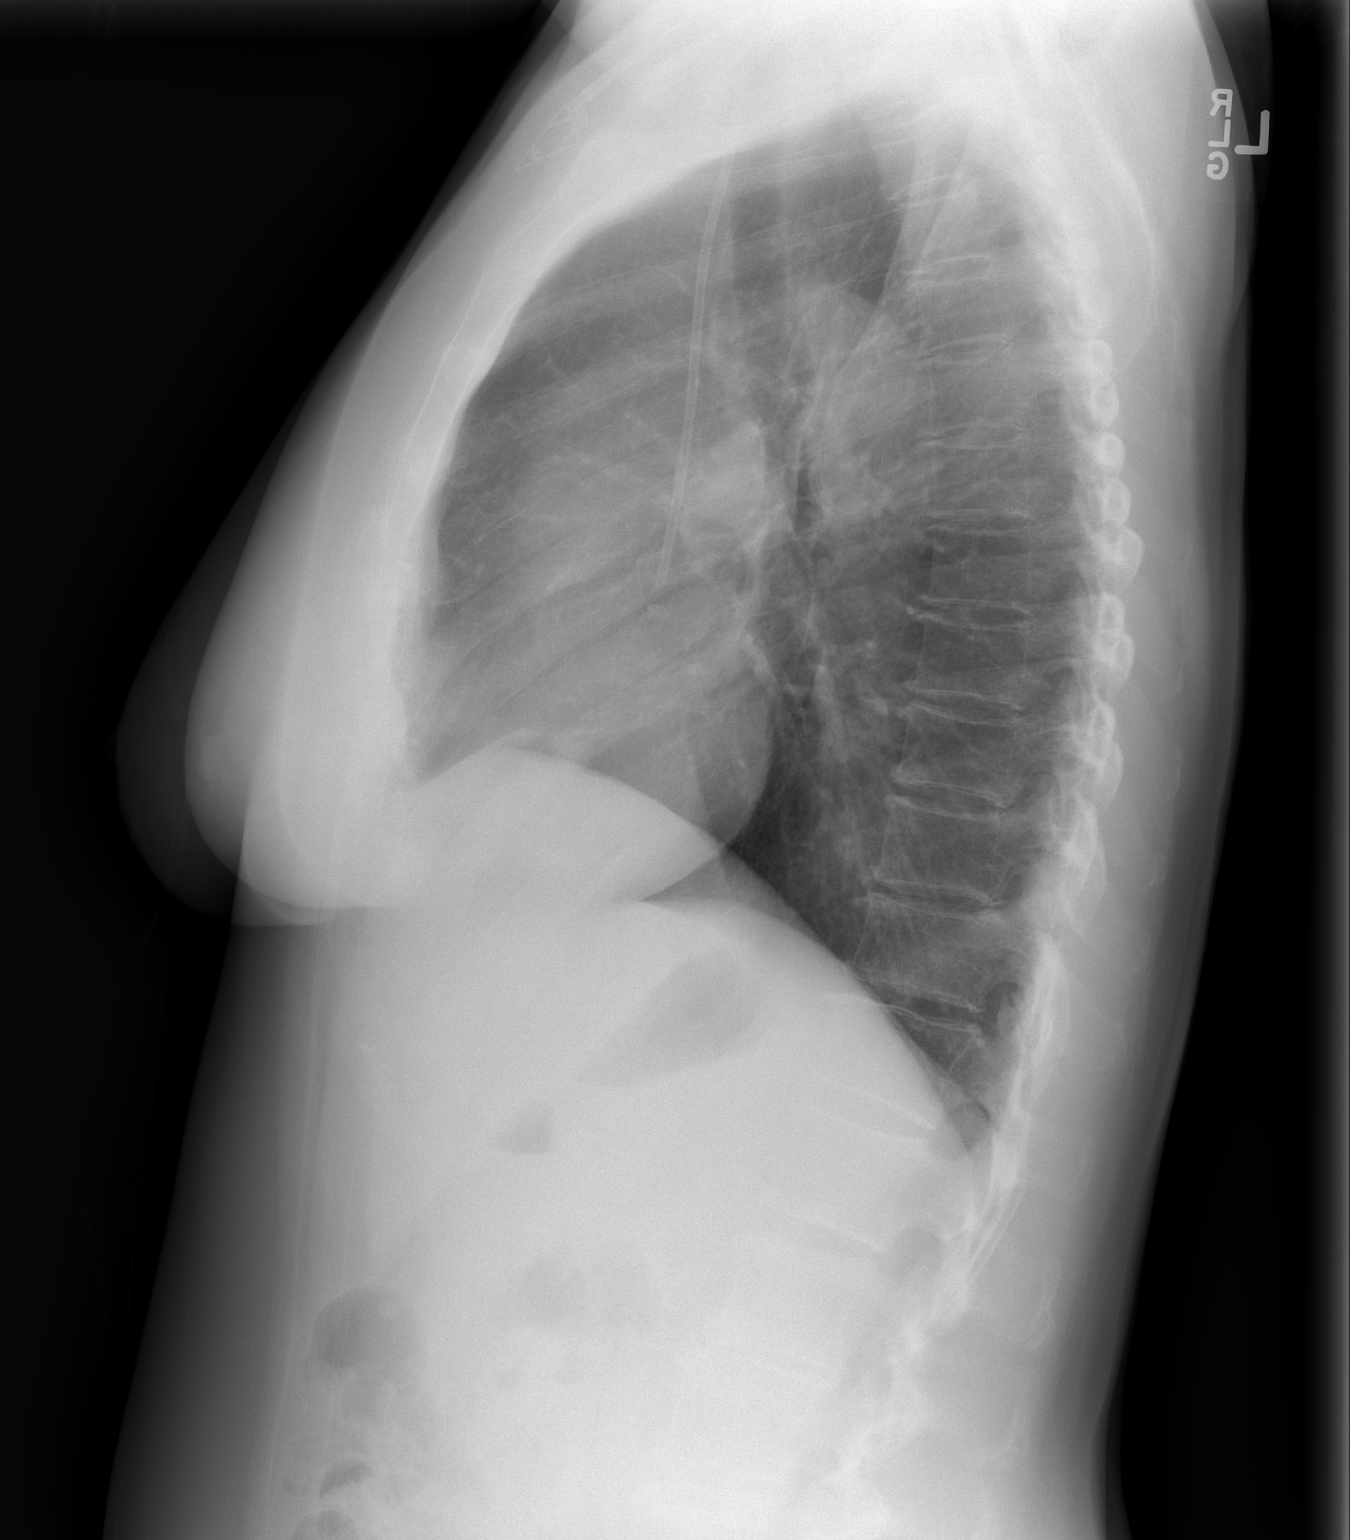

[2 of 2 positions shown; findings below may reference images not displayed]

FINDINGS: The lungs are adequately inflated. There is no evidence of
pneumonia. There is a stable 3 x 5 mm calcified nodule in the right
lower lobe. The heart and pulmonary vascularity are normal. The
mediastinum is normal in width. There is no pleural effusion or
pneumothorax. The bony thorax exhibits no acute abnormalities. There
is stable increased density within the anterior aspect of the left
seventh rib. Previous right shoulder arthroplasty. The power port
appliance tip projects over the midportion of the SVC.
IMPRESSION: There is no evidence of pneumonia nor other acute cardiopulmonary
abnormality.

## 2016-10-05 DIAGNOSIS — C9000 Multiple myeloma not having achieved remission: Secondary | ICD-10-CM | POA: Diagnosis not present

## 2016-10-20 DIAGNOSIS — C9000 Multiple myeloma not having achieved remission: Secondary | ICD-10-CM | POA: Diagnosis not present

## 2016-10-27 ENCOUNTER — Other Ambulatory Visit: Payer: Self-pay | Admitting: *Deleted

## 2016-10-27 ENCOUNTER — Encounter: Payer: Self-pay | Admitting: Hematology and Oncology

## 2016-10-27 DIAGNOSIS — R634 Abnormal weight loss: Secondary | ICD-10-CM

## 2016-10-27 NOTE — Telephone Encounter (Signed)
Called regarding refills. Done

## 2016-10-30 DIAGNOSIS — D469 Myelodysplastic syndrome, unspecified: Secondary | ICD-10-CM | POA: Diagnosis not present

## 2016-10-30 DIAGNOSIS — Z9481 Bone marrow transplant status: Secondary | ICD-10-CM | POA: Diagnosis not present

## 2016-10-30 DIAGNOSIS — C9000 Multiple myeloma not having achieved remission: Secondary | ICD-10-CM | POA: Diagnosis not present

## 2016-10-30 DIAGNOSIS — Z23 Encounter for immunization: Secondary | ICD-10-CM | POA: Diagnosis not present

## 2016-11-07 ENCOUNTER — Telehealth: Payer: Self-pay

## 2016-11-07 NOTE — Telephone Encounter (Addendum)
Pt called thinking she might have sciatica. She has right leg pain that  starts below buttock to below the knee. Dull ache. Is gimpy for 10-12 steps when gets up in AM. 2-3/10 pain level.  It comes and goes. Sit in recliner and getting up is when she feels it. And worse at night. She was on a 40 minute walk when talking with this RN.  Advil about 1x/day, and mostly before bed to help alleviate the pain to be able to sleep.   She has not seen her PCP since her cancer journey started.   Discussed stretching the buttock and TFL, possible foam rolling of area.

## 2016-11-07 NOTE — Telephone Encounter (Signed)
Called with below message, she states that is feeling better. She went for a walk and has did some exercises. She thinks she needs to stretch more.

## 2016-11-07 NOTE — Telephone Encounter (Signed)
If she is interested, we can send her to the spine center on [address] I believe they have an "urgent care/walk in" orthopedic/spine at the Neurosurgery Office

## 2016-11-08 ENCOUNTER — Encounter: Payer: Self-pay | Admitting: Hematology and Oncology

## 2016-11-15 DIAGNOSIS — C9001 Multiple myeloma in remission: Secondary | ICD-10-CM | POA: Diagnosis not present

## 2016-11-22 ENCOUNTER — Encounter: Payer: Self-pay | Admitting: Obstetrics and Gynecology

## 2016-11-22 ENCOUNTER — Ambulatory Visit (INDEPENDENT_AMBULATORY_CARE_PROVIDER_SITE_OTHER): Payer: Medicare HMO | Admitting: Obstetrics and Gynecology

## 2016-11-22 DIAGNOSIS — N812 Incomplete uterovaginal prolapse: Secondary | ICD-10-CM | POA: Diagnosis not present

## 2016-11-22 NOTE — Progress Notes (Signed)
Obstetrics & Gynecology Office Visit   Chief Complaint:  Chief Complaint  Patient presents with  . Follow-up    bladder prolapse    History of Present Illness: This is a 71 y.o. G68P1001 female who presents for discussion of worsening pelvic prolapse. Since her last visit with me in 05/2016 she states that she notes a more prominent bulge, especially after activity. Walking can make this more aggravated.  She states that in her past in [name] she had a procedure for her bladder (?Wendee Copp) and then a subsequent procedure where she has two small incision in her lower abdomen (?sling).  She does not leak urine and does not have frequent UTIs.  She has had a pessary placed by Dr. Sharlett Iles at [facility], which either fell out or was very uncomfortable. She still does not want to have surgery.  Review of Systems: Review of Systems  Constitutional: Negative.   HENT: Negative.   Eyes: Negative.   Respiratory: Negative.   Cardiovascular: Negative.   Gastrointestinal: Negative.   Genitourinary:       Per HPI  Musculoskeletal: Negative.   Skin: Negative.   Neurological: Negative.   Psychiatric/Behavioral: Negative.     Past Medical History:  Diagnosis Date  . Anemia   . Anxiety   . Cancer ([place])   . Carotid stenosis    Mild bilateral  . Carpal tunnel syndrome   . Cervical disc disease    c7  . Cough 07/27/2015  . Disc degeneration   . Dysuria 07/26/2015  . 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 Fatigue 01/26/2015  . Fever 07/27/2015  . GERD (gastroesophageal reflux disease)   . 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 Hypertension    Borderline.  . Hypokalemia 07/16/2015  . Pinched nerve   . Sinusitis, bacterial 07/27/2015  . Spinal stenosis in cervical region   . Varicose veins of lower extremities with inflammation     Past Surgical History:  Procedure Laterality Date  . bladder tact  1988/1989   x2  . COLONOSCOPY    . PORT-A-CATH REMOVAL    . SHOULDER SURGERY Right 04/06/13   right      Gynecologic History: No LMP recorded. Patient is postmenopausal.  Obstetric History: G1P1001  Family History  Problem Relation Age of Onset  . Heart disease Mother   . Heart failure Father   . Heart disease Father   . Throat cancer Father   . Skin cancer Father   . Diabetes Father   . Kidney disease Father   . Hypertension Brother   . Heart disease Brother        congenital shunt, ?   . Colon cancer Paternal Aunt 23  . Skin cancer Brother     Social History   Social History  . Marital status: Divorced    Spouse name: N/A  . Number of children: N/A  . Years of education: N/A   Occupational History  . Not on file.   Social History Main Topics  . Smoking status: Former Smoker    Packs/day: 0.50    Years: 4.00    Quit date: 06/26/1968  . Smokeless tobacco: Never Used  . Alcohol use 0.6 oz/week    1 Cans of beer per week     Comment: minimal  . Drug use: No  . Sexual activity: Not on file   Other Topics Concern  . Not on file   Social History Narrative   Chauncey Reading is daughter Aldona Bar  [place],living will,  full code ( reviewed 2015)    Allergies: No Known Allergies  Medications:   Medication Sig Start Date End Date Taking? Authorizing Provider  acyclovir (ZOVIRAX) 400 MG tablet TAKE 1 TABLET (400 MG TOTAL) BY MOUTH 2 (TWO) TIMES DAILY. Patient not taking: Reported on 11/22/2016 07/17/16   [name], MD  carvedilol (COREG) 6.25 MG tablet Take 6.25 mg by mouth 2 (two) times daily. 01/07/16   [provider]  Cholecalciferol (VITAMIN D-1000 MAX ST) 1000 units tablet Take 1,000 Units by mouth daily.    [provider]  loperamide (IMODIUM) 2 MG capsule Take by mouth as needed for diarrhea or loose stools.    [provider]  LORazepam (ATIVAN) 0.5 MG tablet Take 0.5 mg by mouth 2 (two) times daily as needed. 02/07/16   [provider]  Melatonin 300 MCG TABS Take 300 mcg by mouth at bedtime as needed. 01/07/16   [provider]  mirtazapine (REMERON) 30 MG tablet Take 1 tablet (30 mg total) by mouth at bedtime. 04/30/15   [name], MD  Multiple Vitamin (MULTIVITAMIN WITH MINERALS) TABS tablet Take 1 tablet by mouth daily.    [provider]  Multiple Vitamins-Minerals (ICAPS AREDS 2 PO) Take 1 tablet by mouth 2 (two) times daily.    [provider]  pantoprazole (PROTONIX) 40 MG tablet Take 40 mg by mouth every 12 (twelve) hours. 01/31/16   [provider]  prochlorperazine (COMPAZINE) 5 MG tablet Take 5 mg by mouth every 6 (six) hours as needed.    [provider]  UNABLE TO FIND 2 (two) times daily. Med Name: Magnesium plus protein     [provider]    Physical Exam BP 126/78   Ht 5\' 2"  (1.575 m)   Wt 152 lb (68.9 kg)   BMI 27.80 kg/m  No LMP recorded. Patient is postmenopausal. Physical Exam  Constitutional: She is oriented to person, place, and time and well-developed, well-nourished, and in no distress. No distress.  HENT:  Head: Normocephalic and atraumatic.  Eyes: Conjunctivae are normal. No scleral icterus.  Abdominal: Soft. Bowel sounds are normal. She exhibits no distension. There is no tenderness. There is no rebound and no guarding.  Genitourinary: Vagina normal, uterus normal, cervix normal, right adnexa normal, left adnexa normal and vulva normal. Uterus is not deviated, not enlarged, not fixed and not tender. Cervix exhibits no motion tenderness, no lesion and no tenderness.  Genitourinary Comments: Negative supine stress test.  Anterior vaginal wall protrudes about 1-2cm past hymenal ring, posterior vaginal wall is at the level of the hymenal ring.  The cervix comes to the about 2-3cm inside the level of the hymenal ring.  Musculoskeletal: Normal range of motion. She exhibits no edema.  Neurological: She is alert and oriented to person, place, and time. No cranial nerve deficit.  Psychiatric: Mood, affect and judgment normal.    Female chaperone  present for pelvic and breast  portions of the physical exam  Assessment: 71 y.o. 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 I would strongly recommend she return to [facility] for further evaluation and recommendations, given her prior urogynecologic history and failure of pessary.  She voiced understand and agreement with the plan.    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 I would strongly recommend she return to  [facility] for further evaluation and recommendations, given her prior urogynecologic history and failure of pessary.  She voiced understand and agreement with the plan.         Prentice Docker, MD 11/22/2016 1:28 PM

## 2016-11-22 NOTE — Assessment & Plan Note (Signed)
Discussed in detail treatment options. I would strongly recommend she return to [facility] for further evaluation and recommendations, given her prior urogynecologic history and failure of pessary.  She voiced understand and agreement with the plan.

## 2016-12-04 DIAGNOSIS — H353131 Nonexudative age-related macular degeneration, bilateral, early dry stage: Secondary | ICD-10-CM | POA: Diagnosis not present

## 2016-12-06 ENCOUNTER — Encounter: Payer: Self-pay | Admitting: Hematology and Oncology

## 2016-12-07 ENCOUNTER — Telehealth: Payer: Self-pay | Admitting: Hematology and Oncology

## 2016-12-07 ENCOUNTER — Other Ambulatory Visit (HOSPITAL_BASED_OUTPATIENT_CLINIC_OR_DEPARTMENT_OTHER): Payer: Medicare HMO

## 2016-12-07 ENCOUNTER — Encounter: Payer: Self-pay | Admitting: Hematology and Oncology

## 2016-12-07 ENCOUNTER — Ambulatory Visit (HOSPITAL_BASED_OUTPATIENT_CLINIC_OR_DEPARTMENT_OTHER): Payer: Medicare HMO | Admitting: Hematology and Oncology

## 2016-12-07 VITALS — BP 141/76 | HR 80 | Temp 98.6°F | Resp 20 | Ht 62.0 in | Wt 152.3 lb

## 2016-12-07 DIAGNOSIS — R5383 Other fatigue: Secondary | ICD-10-CM

## 2016-12-07 DIAGNOSIS — D469 Myelodysplastic syndrome, unspecified: Secondary | ICD-10-CM

## 2016-12-07 DIAGNOSIS — C9001 Multiple myeloma in remission: Secondary | ICD-10-CM | POA: Diagnosis not present

## 2016-12-07 DIAGNOSIS — Z9481 Bone marrow transplant status: Secondary | ICD-10-CM | POA: Diagnosis not present

## 2016-12-07 DIAGNOSIS — B259 Cytomegaloviral disease, unspecified: Secondary | ICD-10-CM

## 2016-12-07 LAB — COMPREHENSIVE METABOLIC PANEL
ALT: 17 U/L (ref 0–55)
AST: 23 U/L (ref 5–34)
Albumin: 3.4 g/dL — ABNORMAL LOW (ref 3.5–5.0)
Alkaline Phosphatase: 112 U/L (ref 40–150)
Anion Gap: 9 mEq/L (ref 3–11)
BUN: 14.4 mg/dL (ref 7.0–26.0)
CO2: 31 mEq/L — ABNORMAL HIGH (ref 22–29)
Calcium: 9.7 mg/dL (ref 8.4–10.4)
Chloride: 103 mEq/L (ref 98–109)
Creatinine: 0.8 mg/dL (ref 0.6–1.1)
EGFR: 76 mL/min/{1.73_m2} — ABNORMAL LOW (ref >90)
Glucose: 97 mg/dl (ref 70–140)
Potassium: 4.3 mEq/L (ref 3.5–5.1)
Sodium: 142 mEq/L (ref 136–145)
Total Bilirubin: 0.53 mg/dL (ref 0.20–1.20)
Total Protein: 6.3 g/dL — ABNORMAL LOW (ref 6.4–8.3)

## 2016-12-07 LAB — CBC WITH DIFFERENTIAL/PLATELET
BASO%: 0.5 % (ref 0.0–2.0)
Basophils Absolute: 0 10*3/uL (ref 0.0–0.1)
EOS%: 2 % (ref 0.0–7.0)
Eosinophils Absolute: 0.1 10*3/uL (ref 0.0–0.5)
HCT: 40.5 % (ref 34.8–46.6)
HGB: 13.9 g/dL (ref 11.6–15.9)
LYMPH%: 39.2 % (ref 14.0–49.7)
MCH: 35.8 pg — ABNORMAL HIGH (ref 25.1–34.0)
MCHC: 34.4 g/dL (ref 31.5–36.0)
MCV: 104.1 fL — ABNORMAL HIGH (ref 79.5–101.0)
MONO#: 0.5 10*3/uL (ref 0.1–0.9)
MONO%: 9.3 % (ref 0.0–14.0)
NEUT#: 2.8 10*3/uL (ref 1.5–6.5)
NEUT%: 49 % (ref 38.4–76.8)
Platelets: 157 10*3/uL (ref 145–400)
RBC: 3.89 10*6/uL (ref 3.70–5.45)
RDW: 12.3 % (ref 11.2–14.5)
WBC: 5.8 10*3/uL (ref 3.9–10.3)
lymph#: 2.3 10*3/uL (ref 0.9–3.3)

## 2016-12-07 NOTE — Telephone Encounter (Signed)
Appointments scheduled per 12/07/16 los. °Patient was given a copy of the AVS report and appointment schedule, per 12/07/16 los. °

## 2016-12-07 NOTE — Assessment & Plan Note (Addendum)
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, with associated mildly elevated lambda light chain Overall, she is not symptomatic I recommend close observation only I plan to repeat blood work and 24 hour urine collection next month She does not need repeat skeletal survey until next year I recommend she takes vitamin D and calcium supplement Her recent bone density scan showed only osteopenia I do not feel strongly she needs to continue Zometa infusion The patient desired to transition her future follow-up to [facility] I will touch base with her transplant physician once we have test results next month She will continue to follow up at the transplant center for her vaccination as scheduled

## 2016-12-07 NOTE — Assessment & Plan Note (Signed)
The patient is fully engrafted with no signs of graft-versus-host disease She will continue close follow-up at the transplant center

## 2016-12-07 NOTE — Assessment & Plan Note (Signed)
She complain of excessive fatigue   I will check a TSH in her next blood draw

## 2016-12-07 NOTE — Progress Notes (Signed)
[place] OFFICE PROGRESS NOTE  Patient Care Team: Jinny Sanders, MD as PCP - General (Family Medicine) [name], MD as Referring Physician (Hematology and Oncology) [name], MD as Consulting Physician (Internal Medicine) [name], [name], MD as Consulting Physician (Infectious Diseases) [name], MD as Consulting Physician (Hematology and Oncology) [name], NP as Nurse Practitioner (Hematology and Oncology)  SUMMARY OF ONCOLOGIC HISTORY: Oncology History   Multiple myeloma, Ig A Lambda, M spike 3.54 grams, Calcium 9.2, Creatinine 0.8, Beta 2 microglobulin 4.52, IgA 4840 mg/dL, lambda light chain 75.4, albumin 3.6, hemoglobin 9.7, platelet 115    Primary site: Multiple Myeloma   Staging method: AJCC 6th Edition   Clinical: Stage IIA signed by [name], MD on 11/07/2013  2:46 PM   Summary: Stage IIA        Multiple myeloma in remission ([place])   10/31/2013 Bone Marrow Biopsy    Bone marrow biopsy confirmed multiple myeloma with 40% bone marrow involvement. Skeletal survey showed minimal lesions in her score with generalized demineralization      11/10/2013 - 02/13/2014 Chemotherapy    The patient is started on induction chemotherapy with weekly dexamethasone 40 mg by mouth as well as Velcade subcutaneous injection on days 1, 4, 8 and 11. On 11/21/2013, she was started on monthly Zometa.      12/23/2013 Adverse Reaction    The dose of Velcade was reduced due to thrombocytopenia.      01/28/2014 - 04/07/2014 Chemotherapy    Revlimid is added. Treatment was discontinued due to lack of response.      02/24/2014 - 04/07/2014 Chemotherapy    Due to worsening peripheral neuropathy, Velcade injection is changed to once a week. Revlimid was given 21 days on, 7 days off.      04/07/2014 - 04/10/2014 Chemotherapy    Revlimid was discontinued due to lack of response. Chemotherapy was changed back to Velcade injection  twice a week, 2 weeks on 1 week off. Her treatment was switched to to minimum response      04/20/2014 - 06/02/2014 Chemotherapy    chemotherapy is switched to Carfilzomib, Cytoxan and dexamethasone.      04/22/2014 Procedure    she has placement of port for chemotherapy.      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 [facility]      10/20/2014 - 03/24/2015 Chemotherapy     she received chemotherapy with Kyprolis, Revlimid and dexamethasone      10/22/2014 Procedure    She has port placement      01/19/2015 Tumor Marker    IgA lambda M spike at 0.4 g       01/20/2015 Miscellaneous    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 No cource was found and fever resolved with IV vancomycin and meropenem      09/13/2015 Bone Marrow Biopsy    Bone marrow biopsy showed no increased blasts, 3-4 % plasma cells      03/02/2016 Bone Marrow Biopsy    Bone marrow biopsy at [facility] showed normocellular (30%) bone marrow with trilineage hematopoiesis. No significant increase in blasts. No significant increase in plasma cells.       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[place])   04/06/2015 Bone Marrow Biopsy    Accession: [id number] BM biopsy showed RAEB-1      04/06/2015 Tumor Marker    Cytogenetics and FISH for MDS are within normal limits      10/06/2015 - 10/10/2015 Chemotherapy    She received conditioning chemotherapy with busulfan and melphalan      10/12/2015 Bone Marrow Transplant    She received allogenic stem cell transplant      10/19/2015 Adverse Reaction    She developed posttransplant complication with mucositis, viral infection with rhinovirus, neutropenic fever, bilateral pleural effusion and moderate pericardial effusion and CMV reactivation.      10/31/2015 Miscellaneous    She has engrafted       INTERVAL HISTORY: Please see below for problem oriented charting. She returns for further follow-up The patient shared with me her disappointment at her recent visit at [facility], she feels well Denies bone pain She denies recent infection She is more active and participating in physical activity She is off all immunosuppressive therapy  REVIEW OF SYSTEMS:   Constitutional: Denies fevers, chills or abnormal weight loss Eyes: Denies blurriness of vision Ears, nose, mouth, throat, and face: Denies mucositis or sore throat Respiratory: Denies cough, dyspnea or wheezes Cardiovascular: Denies palpitation, chest discomfort or lower extremity swelling Gastrointestinal:  Denies nausea, heartburn or change in bowel habits Skin: Denies abnormal skin rashes Lymphatics: Denies new lymphadenopathy or easy bruising Neurological:Denies numbness, tingling or new weaknesses Behavioral/Psych: Mood is stable, no new changes  All other systems were reviewed with the patient and are negative.  I have reviewed the past medical history, past surgical history, social  history and family history with the patient and they are unchanged from previous note.  ALLERGIES:  has No Known Allergies.  MEDICATIONS:  Current Outpatient Prescriptions  Medication Sig Dispense Refill  . carvedilol (COREG) 6.25 MG tablet Take 6.25 mg by mouth 2 (two) times daily.    . Cholecalciferol (VITAMIN D-1000 MAX ST) 1000 units tablet Take 1,000 Units by mouth daily.    . mirtazapine (REMERON) 30 MG tablet Take 1 tablet (30 mg total) by mouth at bedtime. 90 tablet 3  . Multiple Vitamin (MULTIVITAMIN WITH MINERALS) TABS tablet Take 1 tablet by mouth daily.    . Multiple Vitamins-Minerals (ICAPS AREDS 2 PO) Take 1 tablet by mouth 2 (two) times daily.    Marland Kitchen UNABLE TO FIND 2 (two) times daily. Med Name: Magnesium plus protein     . docusate sodium (  COLACE) 100 MG capsule Take by mouth.    . loperamide (IMODIUM) 2 MG capsule Take by mouth as needed for diarrhea or loose stools.     No current facility-administered medications for this visit.     PHYSICAL EXAMINATION: ECOG PERFORMANCE STATUS: 0 - Asymptomatic  Vitals:   12/07/16 0903  BP: (!) 141/76  Pulse: 80  Resp: 20  Temp: 98.6 F (37 C)   Filed Weights   12/07/16 0903  Weight: 152 lb 4.8 oz (69.1 kg)    GENERAL:alert, no distress and comfortable SKIN: skin color, texture, turgor are normal, no rashes or significant lesions EYES: normal, Conjunctiva are pink and non-injected, sclera clear OROPHARYNX:no exudate, no erythema and lips, buccal mucosa, and tongue normal  NECK: supple, thyroid normal size, non-tender, without nodularity LYMPH:  no palpable lymphadenopathy in the cervical, axillary or inguinal LUNGS: clear to auscultation and percussion with normal breathing effort HEART: regular rate & rhythm and no murmurs and no lower extremity edema ABDOMEN:abdomen soft, non-tender and normal bowel sounds Musculoskeletal:no cyanosis of digits and no clubbing  NEURO: alert & oriented x 3 with fluent speech, no focal  motor/sensory deficits  LABORATORY DATA:  I have reviewed the data as listed    Component Value Date/Time   NA 142 12/07/2016 0854   K 4.3 12/07/2016 0854   CL 106 08/06/2015 0530   CO2 31 (H) 12/07/2016 0854   GLUCOSE 97 12/07/2016 0854   BUN 14.4 12/07/2016 0854   CREATININE 0.8 12/07/2016 0854   CALCIUM 9.7 12/07/2016 0854   PROT 6.3 (L) 12/07/2016 0854   ALBUMIN 3.4 (L) 12/07/2016 0854   AST 23 12/07/2016 0854   ALT 17 12/07/2016 0854   ALKPHOS 112 12/07/2016 0854   BILITOT 0.53 12/07/2016 0854   GFRNONAA >60 08/06/2015 0530   GFRAA >60 08/06/2015 0530    No results found for: SPEP, UPEP  Lab Results  Component Value Date   WBC 5.8 12/07/2016   NEUTROABS 2.8 12/07/2016   HGB 13.9 12/07/2016   HCT 40.5 12/07/2016   MCV 104.1 (H) 12/07/2016   PLT 157 12/07/2016      Chemistry      Component Value Date/Time   NA 142 12/07/2016 0854   K 4.3 12/07/2016 0854   CL 106 08/06/2015 0530   CO2 31 (H) 12/07/2016 0854   BUN 14.4 12/07/2016 0854   CREATININE 0.8 12/07/2016 0854      Component Value Date/Time   CALCIUM 9.7 12/07/2016 0854   ALKPHOS 112 12/07/2016 0854   AST 23 12/07/2016 0854   ALT 17 12/07/2016 0854   BILITOT 0.53 12/07/2016 0854      ASSESSMENT & PLAN:  Multiple myeloma in remission ([place]) 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, with associated mildly elevated lambda light chain Overall, she is not symptomatic I recommend close observation only I plan to repeat blood work and 24 hour urine collection next month She does not need repeat skeletal survey until next year I recommend she takes vitamin D and calcium supplement Her recent bone density scan showed only osteopenia I do not feel strongly she needs to continue Zometa infusion The patient desired to transition her future follow-up to [facility] I will touch base with her transplant physician once we have test results next month She will continue to  follow up at the transplant center for her vaccination as scheduled  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 CBC with Differential/Platelet    Standing Status:   Future    Standing Expiration Date:   01/11/2018  . Comprehensive metabolic panel    Standing Status:   Future    Standing Expiration Date:   01/11/2018  . Kappa/lambda light chains    Standing Status:   Future    Standing Expiration Date:   01/11/2018  . Multiple Myeloma Panel (SPEP&IFE w/QIG)    Standing Status:   Future    Standing Expiration Date:   01/11/2018  . UPEP/UIFE/Light Chains/TP, 24-Hr Ur    Standing Status:   Future    Standing Expiration Date:   01/11/2018  . Beta 2 microglobulin, serum    Standing Status:   Future    Standing Expiration Date:   01/11/2018  . TSH    Standing Status:   Future    Standing Expiration Date:   01/11/2018  . T4, free    Standing Status:   Future    Standing Expiration Date:   01/11/2018   All questions were answered. The patient knows to call the clinic with any problems, questions or concerns. No barriers to learning was detected. I spent 15 minutes counseling the patient face to face. The total time spent in the appointment was 20 minutes and more than 50% was on counseling and review of test results     [name], MD 12/07/2016 4:14 PM

## 2016-12-07 NOTE — Assessment & Plan Note (Signed)
She is fully engrafted with normal CBC She will continue close monitoring and follow-up 

## 2016-12-21 DIAGNOSIS — M9903 Segmental and somatic dysfunction of lumbar region: Secondary | ICD-10-CM | POA: Diagnosis not present

## 2016-12-21 DIAGNOSIS — M9904 Segmental and somatic dysfunction of sacral region: Secondary | ICD-10-CM | POA: Diagnosis not present

## 2016-12-21 DIAGNOSIS — M5441 Lumbago with sciatica, right side: Secondary | ICD-10-CM | POA: Diagnosis not present

## 2016-12-21 DIAGNOSIS — M461 Sacroiliitis, not elsewhere classified: Secondary | ICD-10-CM | POA: Diagnosis not present

## 2016-12-25 DIAGNOSIS — M9904 Segmental and somatic dysfunction of sacral region: Secondary | ICD-10-CM | POA: Diagnosis not present

## 2016-12-25 DIAGNOSIS — M461 Sacroiliitis, not elsewhere classified: Secondary | ICD-10-CM | POA: Diagnosis not present

## 2016-12-25 DIAGNOSIS — M9903 Segmental and somatic dysfunction of lumbar region: Secondary | ICD-10-CM | POA: Diagnosis not present

## 2016-12-25 DIAGNOSIS — M5441 Lumbago with sciatica, right side: Secondary | ICD-10-CM | POA: Diagnosis not present

## 2016-12-26 ENCOUNTER — Encounter: Payer: Self-pay | Admitting: Hematology and Oncology

## 2016-12-26 DIAGNOSIS — G4709 Other insomnia: Secondary | ICD-10-CM | POA: Diagnosis not present

## 2016-12-26 DIAGNOSIS — C9001 Multiple myeloma in remission: Secondary | ICD-10-CM | POA: Diagnosis not present

## 2016-12-26 DIAGNOSIS — M9903 Segmental and somatic dysfunction of lumbar region: Secondary | ICD-10-CM | POA: Diagnosis not present

## 2016-12-26 DIAGNOSIS — M461 Sacroiliitis, not elsewhere classified: Secondary | ICD-10-CM | POA: Diagnosis not present

## 2016-12-26 DIAGNOSIS — E441 Mild protein-calorie malnutrition: Secondary | ICD-10-CM | POA: Diagnosis not present

## 2016-12-26 DIAGNOSIS — M5441 Lumbago with sciatica, right side: Secondary | ICD-10-CM | POA: Diagnosis not present

## 2016-12-26 DIAGNOSIS — D7589 Other specified diseases of blood and blood-forming organs: Secondary | ICD-10-CM | POA: Diagnosis not present

## 2016-12-26 DIAGNOSIS — I6529 Occlusion and stenosis of unspecified carotid artery: Secondary | ICD-10-CM | POA: Diagnosis not present

## 2016-12-26 DIAGNOSIS — D46Z Other myelodysplastic syndromes: Secondary | ICD-10-CM | POA: Diagnosis not present

## 2016-12-26 DIAGNOSIS — M9904 Segmental and somatic dysfunction of sacral region: Secondary | ICD-10-CM | POA: Diagnosis not present

## 2016-12-26 DIAGNOSIS — I10 Essential (primary) hypertension: Secondary | ICD-10-CM | POA: Diagnosis not present

## 2016-12-26 DIAGNOSIS — Z9481 Bone marrow transplant status: Secondary | ICD-10-CM | POA: Diagnosis not present

## 2016-12-26 DIAGNOSIS — Z0000 Encounter for general adult medical examination without abnormal findings: Secondary | ICD-10-CM | POA: Diagnosis not present

## 2016-12-26 DIAGNOSIS — R5383 Other fatigue: Secondary | ICD-10-CM | POA: Diagnosis not present

## 2017-01-01 DIAGNOSIS — M461 Sacroiliitis, not elsewhere classified: Secondary | ICD-10-CM | POA: Diagnosis not present

## 2017-01-01 DIAGNOSIS — M5441 Lumbago with sciatica, right side: Secondary | ICD-10-CM | POA: Diagnosis not present

## 2017-01-01 DIAGNOSIS — M9903 Segmental and somatic dysfunction of lumbar region: Secondary | ICD-10-CM | POA: Diagnosis not present

## 2017-01-01 DIAGNOSIS — M9904 Segmental and somatic dysfunction of sacral region: Secondary | ICD-10-CM | POA: Diagnosis not present

## 2017-01-04 ENCOUNTER — Other Ambulatory Visit (HOSPITAL_BASED_OUTPATIENT_CLINIC_OR_DEPARTMENT_OTHER): Payer: Medicare HMO

## 2017-01-04 DIAGNOSIS — C9001 Multiple myeloma in remission: Secondary | ICD-10-CM | POA: Diagnosis not present

## 2017-01-04 DIAGNOSIS — M5441 Lumbago with sciatica, right side: Secondary | ICD-10-CM | POA: Diagnosis not present

## 2017-01-04 DIAGNOSIS — M8580 Other specified disorders of bone density and structure, unspecified site: Secondary | ICD-10-CM | POA: Diagnosis not present

## 2017-01-04 DIAGNOSIS — R5383 Other fatigue: Secondary | ICD-10-CM

## 2017-01-04 DIAGNOSIS — M9904 Segmental and somatic dysfunction of sacral region: Secondary | ICD-10-CM | POA: Diagnosis not present

## 2017-01-04 DIAGNOSIS — I10 Essential (primary) hypertension: Secondary | ICD-10-CM | POA: Diagnosis not present

## 2017-01-04 DIAGNOSIS — Z0000 Encounter for general adult medical examination without abnormal findings: Secondary | ICD-10-CM | POA: Diagnosis not present

## 2017-01-04 DIAGNOSIS — M461 Sacroiliitis, not elsewhere classified: Secondary | ICD-10-CM | POA: Diagnosis not present

## 2017-01-04 DIAGNOSIS — M9903 Segmental and somatic dysfunction of lumbar region: Secondary | ICD-10-CM | POA: Diagnosis not present

## 2017-01-04 LAB — CBC WITH DIFFERENTIAL/PLATELET
BASO%: 1 % (ref 0.0–2.0)
Basophils Absolute: 0 10*3/uL (ref 0.0–0.1)
EOS%: 0.9 % (ref 0.0–7.0)
Eosinophils Absolute: 0 10*3/uL (ref 0.0–0.5)
HCT: 36.5 % (ref 34.8–46.6)
HGB: 12.5 g/dL (ref 11.6–15.9)
LYMPH%: 46.7 % (ref 14.0–49.7)
MCH: 35.3 pg — ABNORMAL HIGH (ref 25.1–34.0)
MCHC: 34.3 g/dL (ref 31.5–36.0)
MCV: 103.1 fL — ABNORMAL HIGH (ref 79.5–101.0)
MONO#: 0.3 10*3/uL (ref 0.1–0.9)
MONO%: 6.9 % (ref 0.0–14.0)
NEUT#: 2.1 10*3/uL (ref 1.5–6.5)
NEUT%: 44.5 % (ref 38.4–76.8)
Platelets: 159 10*3/uL (ref 145–400)
RBC: 3.54 10*6/uL — ABNORMAL LOW (ref 3.70–5.45)
RDW: 12.2 % (ref 11.2–14.5)
WBC: 4.8 10*3/uL (ref 3.9–10.3)
lymph#: 2.2 10*3/uL (ref 0.9–3.3)

## 2017-01-04 LAB — COMPREHENSIVE METABOLIC PANEL
ALT: 16 U/L (ref 0–55)
AST: 21 U/L (ref 5–34)
Albumin: 3.4 g/dL — ABNORMAL LOW (ref 3.5–5.0)
Alkaline Phosphatase: 101 U/L (ref 40–150)
Anion Gap: 9 mEq/L (ref 3–11)
BUN: 9.5 mg/dL (ref 7.0–26.0)
CO2: 28 mEq/L (ref 22–29)
Calcium: 9 mg/dL (ref 8.4–10.4)
Chloride: 105 mEq/L (ref 98–109)
Creatinine: 0.7 mg/dL (ref 0.6–1.1)
EGFR: 86 mL/min/{1.73_m2} — ABNORMAL LOW (ref >90)
Glucose: 92 mg/dl (ref 70–140)
Potassium: 3.9 mEq/L (ref 3.5–5.1)
Sodium: 142 mEq/L (ref 136–145)
Total Bilirubin: 0.56 mg/dL (ref 0.20–1.20)
Total Protein: 6 g/dL — ABNORMAL LOW (ref 6.4–8.3)

## 2017-01-04 LAB — TSH: TSH: 1.461 m(IU)/L (ref 0.308–3.960)

## 2017-01-05 LAB — UPEP/UIFE/LIGHT CHAINS/TP, 24-HR UR
% BETA, Urine: 25.7 %
ALBUMIN, U: 43.5 %
ALPHA 1 URINE: 7.1 %
ALPHA-2-GLOBULIN, U: 9.8 %
Free Kappa Lt Chains,Ur: 18.1 mg/L (ref 1.35–24.19)
Free Lambda Lt Chains,Ur: 1.38 mg/L (ref 0.24–6.66)
GAMMA GLOBULIN URINE: 13.8 %
Kappa/Lambda Ratio,U: 13.12 — ABNORMAL HIGH (ref 2.04–10.37)
PROTEIN,TOTAL,URINE: 7.6 mg/dL
Prot,24hr calculated: 72 mg/24 hr (ref 30–150)

## 2017-01-05 LAB — KAPPA/LAMBDA LIGHT CHAINS
Ig Kappa Free Light Chain: 13 mg/L (ref 3.3–19.4)
Ig Lambda Free Light Chain: 25 mg/L (ref 5.7–26.3)
Kappa/Lambda FluidC Ratio: 0.52 (ref 0.26–1.65)

## 2017-01-05 LAB — T4, FREE: T4,Free(Direct): 1.14 ng/dL (ref 0.82–1.77)

## 2017-01-05 LAB — BETA 2 MICROGLOBULIN, SERUM: Beta-2: 3.2 mg/L — ABNORMAL HIGH (ref 0.6–2.4)

## 2017-01-10 ENCOUNTER — Encounter: Payer: Self-pay | Admitting: Hematology and Oncology

## 2017-01-11 ENCOUNTER — Encounter: Payer: Self-pay | Admitting: Hematology and Oncology

## 2017-01-11 ENCOUNTER — Telehealth: Payer: Self-pay | Admitting: Hematology and Oncology

## 2017-01-11 ENCOUNTER — Ambulatory Visit (HOSPITAL_BASED_OUTPATIENT_CLINIC_OR_DEPARTMENT_OTHER): Payer: Medicare HMO | Admitting: Hematology and Oncology

## 2017-01-11 VITALS — BP 123/65 | HR 85 | Temp 98.6°F | Resp 18 | Ht 62.0 in | Wt 155.2 lb

## 2017-01-11 DIAGNOSIS — R5383 Other fatigue: Secondary | ICD-10-CM

## 2017-01-11 DIAGNOSIS — D469 Myelodysplastic syndrome, unspecified: Secondary | ICD-10-CM | POA: Diagnosis not present

## 2017-01-11 DIAGNOSIS — Z9481 Bone marrow transplant status: Secondary | ICD-10-CM

## 2017-01-11 DIAGNOSIS — G4700 Insomnia, unspecified: Secondary | ICD-10-CM

## 2017-01-11 DIAGNOSIS — C9001 Multiple myeloma in remission: Secondary | ICD-10-CM

## 2017-01-11 LAB — MULTIPLE MYELOMA PANEL, SERUM
Albumin SerPl Elph-Mcnc: 3.3 g/dL (ref 2.9–4.4)
Albumin/Glob SerPl: 1.4 (ref 0.7–1.7)
Alpha 1: 0.2 g/dL (ref 0.0–0.4)
Alpha2 Glob SerPl Elph-Mcnc: 0.7 g/dL (ref 0.4–1.0)
B-Globulin SerPl Elph-Mcnc: 0.8 g/dL (ref 0.7–1.3)
Gamma Glob SerPl Elph-Mcnc: 0.8 g/dL (ref 0.4–1.8)
Globulin, Total: 2.5 g/dL (ref 2.2–3.9)
IgA, Qn, Serum: 170 mg/dL (ref 87–352)
IgG, Qn, Serum: 679 mg/dL — ABNORMAL LOW (ref 700–1600)
IgM, Qn, Serum: 134 mg/dL (ref 26–217)
M Protein SerPl Elph-Mcnc: 0.1 g/dL — ABNORMAL HIGH
Total Protein: 5.8 g/dL — ABNORMAL LOW (ref 6.0–8.5)

## 2017-01-11 MED ORDER — ZOLPIDEM TARTRATE 10 MG PO TABS: 10.0000 mg | ORAL_TABLET | Freq: Every evening | ORAL | 0 refills | Status: DC | PRN

## 2017-01-11 NOTE — Assessment & Plan Note (Signed)
Myeloma panel is still pending.  Light chain studies are within normal limits.   24 hour urine collection is within normal limits  overall, she is not symptomatic I recommend close observation only She does not need repeat skeletal survey until next year I recommend she takes vitamin D and calcium supplement Her recent bone density scan showed only osteopenia I do not feel strongly she needs to continue Zometa infusion The patient desired to transition her future follow-up to [facility] I will touch base with her transplant physician once we have test results She will continue to follow up at the transplant center for her vaccination as scheduled

## 2017-01-11 NOTE — Assessment & Plan Note (Signed)
She is fully engrafted with normal CBC She will continue close monitoring and follow-up 

## 2017-01-11 NOTE — Assessment & Plan Note (Signed)
The patient is fully engrafted with no signs of graft-versus-host disease She will continue close follow-up at the transplant center

## 2017-01-11 NOTE — Telephone Encounter (Signed)
Scheduled appt per 7/19 los- Gave patient AVS and calender per los.  

## 2017-01-11 NOTE — Progress Notes (Signed)
[place] OFFICE PROGRESS NOTE  Patient Care Team: Jinny Sanders, MD as PCP - General (Family Medicine) [name], MD as Referring Physician (Hematology and Oncology) [name], MD as Consulting Physician (Internal Medicine) [name], [name], MD as Consulting Physician (Infectious Diseases) [name], MD as Consulting Physician (Hematology and Oncology) [name], NP as Nurse Practitioner (Hematology and Oncology)  SUMMARY OF ONCOLOGIC HISTORY: Oncology History   Multiple myeloma, Ig A Lambda, M spike 3.54 grams, Calcium 9.2, Creatinine 0.8, Beta 2 microglobulin 4.52, IgA 4840 mg/dL, lambda light chain 75.4, albumin 3.6, hemoglobin 9.7, platelet 115    Primary site: Multiple Myeloma   Staging method: AJCC 6th Edition   Clinical: Stage IIA signed by [name], MD on 11/07/2013  2:46 PM   Summary: Stage IIA        Multiple myeloma in remission ([place])   10/31/2013 Bone Marrow Biopsy    Bone marrow biopsy confirmed multiple myeloma with 40% bone marrow involvement. Skeletal survey showed minimal lesions in her score with generalized demineralization      11/10/2013 - 02/13/2014 Chemotherapy    The patient is started on induction chemotherapy with weekly dexamethasone 40 mg by mouth as well as Velcade subcutaneous injection on days 1, 4, 8 and 11. On 11/21/2013, she was started on monthly Zometa.      12/23/2013 Adverse Reaction    The dose of Velcade was reduced due to thrombocytopenia.      01/28/2014 - 04/07/2014 Chemotherapy    Revlimid is added. Treatment was discontinued due to lack of response.      02/24/2014 - 04/07/2014 Chemotherapy    Due to worsening peripheral neuropathy, Velcade injection is changed to once a week. Revlimid was given 21 days on, 7 days off.      04/07/2014 - 04/10/2014 Chemotherapy    Revlimid was discontinued due to lack of response. Chemotherapy was changed back to Velcade injection  twice a week, 2 weeks on 1 week off. Her treatment was switched to to minimum response      04/20/2014 - 06/02/2014 Chemotherapy    chemotherapy is switched to Carfilzomib, Cytoxan and dexamethasone.      04/22/2014 Procedure    she has placement of port for chemotherapy.      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 [facility]      10/20/2014 - 03/24/2015 Chemotherapy     she received chemotherapy with Kyprolis, Revlimid and dexamethasone      10/22/2014 Procedure    She has port placement      01/19/2015 Tumor Marker    IgA lambda M spike at 0.4 g       01/20/2015 Miscellaneous    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 No cource was found and fever resolved with IV vancomycin and meropenem      09/13/2015 Bone Marrow Biopsy    Bone marrow biopsy showed no increased blasts, 3-4 % plasma cells      03/02/2016 Bone Marrow Biopsy    Bone marrow biopsy at [facility] showed normocellular (30%) bone marrow with trilineage hematopoiesis. No significant increase in blasts. No significant increase in plasma cells.       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[place])   04/06/2015 Bone Marrow Biopsy    Accession: [id number] BM biopsy showed RAEB-1      04/06/2015 Tumor Marker    Cytogenetics and FISH for MDS are within normal limits      10/06/2015 - 10/10/2015 Chemotherapy    She received conditioning chemotherapy with busulfan and melphalan      10/12/2015 Bone Marrow Transplant    She received allogenic stem cell transplant      10/19/2015 Adverse Reaction    She developed posttransplant complication with mucositis, viral infection with rhinovirus, neutropenic fever, bilateral pleural effusion and moderate pericardial effusion and CMV reactivation.      10/31/2015 Miscellaneous    She has engrafted       INTERVAL HISTORY: Please see below for problem oriented charting. She returns for further follow-up She is not pleased that she has gained some weight She complain of abdominal fullness but denies changes in bowel habits She had mild dysphagia and has arranged for GI follow-up Denies recent infection No new bone pain REVIEW OF SYSTEMS:   Constitutional: Denies fevers, chills or abnormal weight loss Eyes: Denies blurriness of vision Ears, nose, mouth, throat, and face: Denies mucositis or sore throat Respiratory: Denies cough, dyspnea or wheezes Cardiovascular: Denies palpitation, chest discomfort or lower extremity swelling Gastrointestinal:  Denies nausea, heartburn or change in bowel habits Skin: Denies abnormal skin rashes Lymphatics: Denies new lymphadenopathy or easy bruising Neurological:Denies numbness, tingling or new weaknesses Behavioral/Psych: Mood is stable, no new changes  All other systems were reviewed with the patient and are negative.  I have reviewed the past medical history, past surgical history, social history and family history with the  patient and they are unchanged from previous note.  ALLERGIES:  has No Known Allergies.  MEDICATIONS:  Current Outpatient Prescriptions  Medication Sig Dispense Refill  . carvedilol (COREG) 6.25 MG tablet Take 6.25 mg by mouth 2 (two) times daily.    . Cholecalciferol (VITAMIN D-1000 MAX ST) 1000 units tablet Take 1,000 Units by mouth daily.    Marland Kitchen docusate sodium (COLACE) 100 MG capsule Take by mouth.    . loperamide (IMODIUM) 2 MG capsule Take by mouth as needed for diarrhea or loose stools.    . Multiple Vitamin (MULTIVITAMIN WITH MINERALS) TABS tablet Take 1 tablet by mouth daily.    . Multiple Vitamins-Minerals (ICAPS AREDS 2 PO) Take 1 tablet by mouth 2 (two) times daily.    Marland Kitchen UNABLE TO FIND 2 (two) times daily. Med Name: Magnesium  plus protein     . zolpidem (AMBIEN) 10 MG tablet Take 1 tablet (10 mg total) by mouth at bedtime as needed for sleep. 30 tablet 0   No current facility-administered medications for this visit.     PHYSICAL EXAMINATION: ECOG PERFORMANCE STATUS: 1 - Symptomatic but completely ambulatory  Vitals:   01/11/17 1005  BP: 123/65  Pulse: 85  Resp: 18  Temp: 98.6 F (37 C)   Filed Weights   01/11/17 1005  Weight: 155 lb 3.2 oz (70.4 kg)    GENERAL:alert, no distress and comfortable SKIN: skin color, texture, turgor are normal, no rashes or significant lesions EYES: normal, Conjunctiva are pink and non-injected, sclera clear OROPHARYNX:no exudate, no erythema and lips, buccal mucosa, and tongue normal  NECK: supple, thyroid normal size, non-tender, without nodularity LYMPH:  no palpable lymphadenopathy in the cervical, axillary or inguinal LUNGS: clear to auscultation and percussion with normal breathing effort HEART: regular rate & rhythm and no murmurs and no lower extremity edema ABDOMEN:abdomen soft, non-tender and normal bowel sounds Musculoskeletal:no cyanosis of digits and no clubbing  NEURO: alert & oriented x 3 with fluent speech, no focal  motor/sensory deficits  LABORATORY DATA:  I have reviewed the data as listed    Component Value Date/Time   NA 142 01/04/2017 0902   K 3.9 01/04/2017 0902   CL 106 08/06/2015 0530   CO2 28 01/04/2017 0902   GLUCOSE 92 01/04/2017 0902   BUN 9.5 01/04/2017 0902   CREATININE 0.7 01/04/2017 0902   CALCIUM 9.0 01/04/2017 0902   PROT 6.0 (L) 01/04/2017 0902   ALBUMIN 3.4 (L) 01/04/2017 0902   AST 21 01/04/2017 0902   ALT 16 01/04/2017 0902   ALKPHOS 101 01/04/2017 0902   BILITOT 0.56 01/04/2017 0902   GFRNONAA >60 08/06/2015 0530   GFRAA >60 08/06/2015 0530    No results found for: SPEP, UPEP  Lab Results  Component Value Date   WBC 4.8 01/04/2017   NEUTROABS 2.1 01/04/2017   HGB 12.5 01/04/2017   HCT 36.5 01/04/2017   MCV 103.1 (H) 01/04/2017   PLT 159 01/04/2017      Chemistry      Component Value Date/Time   NA 142 01/04/2017 0902   K 3.9 01/04/2017 0902   CL 106 08/06/2015 0530   CO2 28 01/04/2017 0902   BUN 9.5 01/04/2017 0902   CREATININE 0.7 01/04/2017 0902      Component Value Date/Time   CALCIUM 9.0 01/04/2017 0902   ALKPHOS 101 01/04/2017 0902   AST 21 01/04/2017 0902   ALT 16 01/04/2017 0902   BILITOT 0.56 01/04/2017 0902       ASSESSMENT & PLAN:  Multiple myeloma in remission ([place]) Myeloma panel is still pending.  Light chain studies are within normal limits.   24 hour urine collection is within normal limits  overall, she is not symptomatic I recommend close observation only She does not need repeat skeletal survey until next year I recommend she takes vitamin D and calcium supplement Her recent bone density scan showed only osteopenia I do not feel strongly she needs to continue Zometa infusion The patient desired to transition her future follow-up to [facility] I will touch base with her transplant physician once we have test results She will continue to follow up at the transplant center for her vaccination as scheduled  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[place]) She is fully engrafted with normal CBC She will continue close monitoring and follow-up  Other fatigue  She complain of excessive fatigue  TSH is within normal limits I am wondering whether it could be due to side effects of Remeron I recommend she reduce Remeron to half a dose and eventually taper off.  That could also help eliminate the cause of her weight gain.  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 CBC with Differential/Platelet    Standing Status:   Future    Standing Expiration Date:   02/15/2018  . Comprehensive metabolic panel    Standing Status:   Future    Standing Expiration Date:   02/15/2018  . Kappa/lambda light chains    Standing Status:   Future    Standing Expiration Date:   02/15/2018  . Multiple Myeloma Panel (SPEP&IFE w/QIG)    Standing Status:   Future    Standing Expiration Date:   02/15/2018   All questions were answered. The patient knows to call the clinic with any problems, questions or concerns. No barriers to learning was detected. I spent 15 minutes counseling the patient face to face. The total time spent in the appointment was 20 minutes and more than 50% was on counseling and review of test results     [name], MD 01/11/2017 1:40 PM

## 2017-01-11 NOTE — Assessment & Plan Note (Addendum)
She complain of excessive fatigue  TSH is within normal limits I am wondering whether it could be due to side effects of Remeron I recommend she reduce Remeron to half a dose and eventually taper off.  That could also help eliminate the cause of her weight gain.

## 2017-01-12 ENCOUNTER — Telehealth: Payer: Self-pay

## 2017-01-12 NOTE — Telephone Encounter (Signed)
-----   Message from [name], MD sent at 01/11/2017  2:46 PM EDT ----- Regarding: meyloma panel pls call her with test result. Only microscopic level of detection, no need to start Rx Also fax results to [facility] ----- Message ----- From: Interface, Lab In Three Zero One Sent: 01/04/2017   9:28 AM To: [name], MD

## 2017-01-12 NOTE — Telephone Encounter (Signed)
Called with below message. Labs faxed to [facility].

## 2017-01-15 ENCOUNTER — Telehealth: Payer: Self-pay | Admitting: Hematology and Oncology

## 2017-01-15 NOTE — Telephone Encounter (Signed)
Faxed records to [phone]

## 2017-01-16 ENCOUNTER — Encounter: Payer: Self-pay | Admitting: Hematology and Oncology

## 2017-01-17 ENCOUNTER — Other Ambulatory Visit: Payer: Self-pay | Admitting: Hematology and Oncology

## 2017-01-17 DIAGNOSIS — Z800 Family history of malignant neoplasm of digestive organs: Secondary | ICD-10-CM

## 2017-01-19 ENCOUNTER — Encounter: Payer: Self-pay | Admitting: Hematology and Oncology

## 2017-01-26 ENCOUNTER — Encounter: Payer: Self-pay | Admitting: Hematology and Oncology

## 2017-01-29 ENCOUNTER — Telehealth: Payer: Self-pay | Admitting: *Deleted

## 2017-01-29 NOTE — Telephone Encounter (Signed)
Notified that [facility] will be calling her today to schedule an appt

## 2017-02-01 ENCOUNTER — Encounter: Payer: Self-pay | Admitting: Hematology and Oncology

## 2017-02-01 ENCOUNTER — Telehealth: Payer: Self-pay | Admitting: Internal Medicine

## 2017-02-01 NOTE — Telephone Encounter (Signed)
Colon report printed from [facility] and placed on Dr. Celesta Aver desk for review.

## 2017-02-05 DIAGNOSIS — Z23 Encounter for immunization: Secondary | ICD-10-CM | POA: Diagnosis not present

## 2017-02-05 DIAGNOSIS — Z9481 Bone marrow transplant status: Secondary | ICD-10-CM | POA: Diagnosis not present

## 2017-02-09 NOTE — Telephone Encounter (Signed)
Dr Carlean Purl will address the records soon, he was not in the office last week to address it sooner, sorry for the delay.

## 2017-02-09 NOTE — Telephone Encounter (Signed)
Pj,  Can you check on this for me? Patient is calling in regarding this.   Thank You

## 2017-02-12 ENCOUNTER — Telehealth: Payer: Self-pay

## 2017-02-12 NOTE — Telephone Encounter (Signed)
-----   Message from Gatha Mayer, MD sent at 02/12/2017  6:24 AM EDT ----- Regarding: clarify hx I have a 2011 colonoscopy report - negative for polyps Family hx colon cancer in an aunt only  So not due for routine colonoscopy until 2021  Please verify accuracy of family hx Ask if having lower GI sxs I see in Dr. Alvy Bimler note that she is having or was having some dysphagia - please clarify  We can decide what is next after we find out answers.  CEG

## 2017-02-12 NOTE — Telephone Encounter (Signed)
Emelynn said it was her Paternal Aunt that had colon cancer.  She was the only family member with it.  She said she feels that she needs an EGD and colonoscopy done.  She has heartburn easily with eating, she feels full most of the time, no dysphagia per patient.She called back to tell me that she has internal and external hemorrhoids which are not currently bothering her.

## 2017-02-14 MED ORDER — PANTOPRAZOLE SODIUM 20 MG PO TBEC: 20.0000 mg | DELAYED_RELEASE_TABLET | Freq: Every day | ORAL | 0 refills | Status: DC

## 2017-02-14 NOTE — Telephone Encounter (Signed)
I spoke to her and explained that I don't think she needs a routine colonoscopy at this time because FHx CRCA was in aunt and not closer relative so 09/2019 recall makes sense for colonoscopy. 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

## 2017-02-14 NOTE — Telephone Encounter (Signed)
I put the colonoscopy recall in the system for 09/2019 and I set up her pre-visit for 02/21/17 to get her instructions.

## 2017-02-15 ENCOUNTER — Encounter: Payer: Self-pay | Admitting: Hematology and Oncology

## 2017-02-15 ENCOUNTER — Encounter: Payer: Self-pay | Admitting: Internal Medicine

## 2017-02-16 ENCOUNTER — Telehealth: Payer: Self-pay | Admitting: Hematology and Oncology

## 2017-02-16 DIAGNOSIS — N8111 Cystocele, midline: Secondary | ICD-10-CM | POA: Diagnosis not present

## 2017-02-16 NOTE — Telephone Encounter (Signed)
Scheduled patient for lab appt and when I called the patient said that she would have to reschedule.   I am cancelling that appt and we will wait on the return of her call.

## 2017-02-21 ENCOUNTER — Ambulatory Visit (AMBULATORY_SURGERY_CENTER): Payer: Self-pay | Admitting: *Deleted

## 2017-02-21 VITALS — Ht 63.0 in | Wt 159.0 lb

## 2017-02-21 DIAGNOSIS — R6881 Early satiety: Secondary | ICD-10-CM

## 2017-02-21 DIAGNOSIS — R12 Heartburn: Secondary | ICD-10-CM

## 2017-02-21 NOTE — Progress Notes (Signed)
Patient denies any allergies to eggs or soy. Patient denies any problems with anesthesia/sedation. Patient denies any oxygen use at home and does not take any diet/weight loss medications. EMMI education assisgned to patient on EGD, this was explained and instructions given to patient.

## 2017-02-22 ENCOUNTER — Encounter: Payer: Self-pay | Admitting: Internal Medicine

## 2017-02-27 DIAGNOSIS — C9000 Multiple myeloma not having achieved remission: Secondary | ICD-10-CM | POA: Diagnosis not present

## 2017-02-28 ENCOUNTER — Other Ambulatory Visit: Payer: Medicare HMO

## 2017-03-06 ENCOUNTER — Ambulatory Visit (AMBULATORY_SURGERY_CENTER): Payer: Medicare HMO | Admitting: Internal Medicine

## 2017-03-06 ENCOUNTER — Encounter: Payer: Self-pay | Admitting: Internal Medicine

## 2017-03-06 VITALS — BP 127/66 | HR 71 | Temp 98.0°F | Resp 29 | Ht 63.0 in | Wt 159.0 lb

## 2017-03-06 DIAGNOSIS — R12 Heartburn: Secondary | ICD-10-CM | POA: Diagnosis present

## 2017-03-06 DIAGNOSIS — K210 Gastro-esophageal reflux disease with esophagitis: Secondary | ICD-10-CM

## 2017-03-06 DIAGNOSIS — K449 Diaphragmatic hernia without obstruction or gangrene: Secondary | ICD-10-CM

## 2017-03-06 DIAGNOSIS — K209 Esophagitis, unspecified: Secondary | ICD-10-CM | POA: Diagnosis not present

## 2017-03-06 DIAGNOSIS — R6881 Early satiety: Secondary | ICD-10-CM

## 2017-03-06 MED ORDER — PANTOPRAZOLE SODIUM 40 MG PO TBEC: 40.0000 mg | DELAYED_RELEASE_TABLET | Freq: Every day | ORAL | 3 refills | Status: DC

## 2017-03-06 MED ORDER — SODIUM CHLORIDE 0.9 % IV SOLN: 500.0000 mL | INTRAVENOUS | Status: DC

## 2017-03-06 NOTE — Patient Instructions (Addendum)
There were 3 main findings:  1) Inflammation in the esophagus - reflux esophagitis or GERD suspected. 2) Hiatal hernia - a portion of the stomach has slipped or moved into the chest. Yours is moderate-sized. Probably causing some of your symptoms and contributing to reflux. 3) Mild stomach inflammation - gastritis.  I took esophageal and stomach biopsies and will let you know results - only expect to see inflammation.  I am prescribing pantoprazole 40 mg daily before breakfast to treat these conditions and will make any changes after pathology reviewed.  Follow an antireflux regimen.  This includes:      - Do not lie down for at least 3 to 4 hours after meals.       - Raise the head of the bed 4 to 6 inches.       - Decrease excess weight.       - Avoid citrus juices and other acidic foods, alcohol, chocolate, mints, coffee and other caffeinated beverages, carbonated beverages, fatty and fried foods.       - Avoid tight-fitting clothing.       - Avoid cigarettes and other tobacco products   I appreciate the opportunity to care for you. Gatha Mayer, MD, FACG YOU HAD AN ENDOSCOPIC PROCEDURE TODAY AT [facility]:   Refer to the procedure report that was given to you for any specific questions about what was found during the examination.  If the procedure report does not answer your questions, please call your gastroenterologist to clarify.  If you requested that your care partner not be given the details of your procedure findings, then the procedure report has been included in a sealed envelope for you to review at your convenience later.  YOU SHOULD EXPECT: Some feelings of bloating in the abdomen. Passage of more gas than usual.  Walking can help get rid of the air that was put into your GI tract during the procedure and reduce the bloating. If you had a lower endoscopy (such as a colonoscopy or flexible sigmoidoscopy) you may notice spotting of blood in your  stool or on the toilet paper. If you underwent a bowel prep for your procedure, you may not have a normal bowel movement for a few days.  Please Note:  You might notice some irritation and congestion in your nose or some drainage.  This is from the oxygen used during your procedure.  There is no need for concern and it should clear up in a day or so.  SYMPTOMS TO REPORT IMMEDIATELY:   Following upper endoscopy (EGD)  Vomiting of blood or coffee ground material  New chest pain or pain under the shoulder blades  Painful or persistently difficult swallowing  New shortness of breath  Fever of 100F or higher  Black, tarry-looking stools  For urgent or emergent issues, a gastroenterologist can be reached at any hour by calling [phone].   DIET:  We do recommend a small meal at first, but then you may proceed to your regular diet.  Drink plenty of fluids but you should avoid alcoholic beverages for 24 hours.  ACTIVITY:  You should plan to take it easy for the rest of today and you should NOT DRIVE or use heavy machinery until tomorrow (because of the sedation medicines used during the test).    FOLLOW UP: Our staff will call the number listed on your records the next business day following your procedure to check on you and address any  questions or concerns that you may have regarding the information given to you following your procedure. If we do not reach you, we will leave a message.  However, if you are feeling well and you are not experiencing any problems, there is no need to return our call.  We will assume that you have returned to your regular daily activities without incident.  If any biopsies were taken you will be contacted by phone or by letter within the next 1-3 weeks.  Please call us at [phone] if you have not heard about the biopsies in 3 weeks.   GERD (handout given)  SIGNATURES/CONFIDENTIALITY: You and/or your care partner have signed paperwork which will be  entered into your electronic medical record.  These signatures attest to the fact that that the information above on your After Visit Summary has been reviewed and is understood.  Full responsibility of the confidentiality of this discharge information lies with you and/or your care-partner.

## 2017-03-06 NOTE — Progress Notes (Signed)
Pt's states no medical or surgical changes since previsit or office visit. 

## 2017-03-06 NOTE — Op Note (Signed)
[facility] Patient Name: Jean Davidson Procedure Date: 03/06/2017 8:22 AM MRN: [id number] Endoscopist: Gatha Mayer , MD Age: 71 Referring MD:  Date of Birth: April 05, 1946 Gender: Female Account #: [id number] Procedure:                Upper GI endoscopy Indications:              Heartburn, Early satiety Medicines:                Propofol per Anesthesia, Monitored Anesthesia Care Procedure:                Pre-Anesthesia Assessment:                           - Prior to the procedure, a History and Physical                            was performed, and patient medications and                            allergies were reviewed. The patient's tolerance of                            previous anesthesia was also reviewed. The risks                            and benefits of the procedure and the sedation                            options and risks were discussed with the patient.                            All questions were answered, and informed consent                            was obtained. Prior Anticoagulants: The patient has                            taken no previous anticoagulant or antiplatelet                            agents. ASA Grade Assessment: II - A patient with                            mild systemic disease. After reviewing the risks                            and benefits, the patient was deemed in                            satisfactory condition to undergo the procedure.                           After obtaining informed consent, the endoscope was  passed under direct vision. Throughout the                            procedure, the patient's blood pressure, pulse, and                            oxygen saturations were monitored continuously. The                            Model GIF-[id number]) scope was introduced                            through the mouth, and advanced to the second part                            of  duodenum. The upper GI endoscopy was                            accomplished without difficulty. The patient                            tolerated the procedure well. Scope In: Scope Out: Findings:                 LA Grade B (one or more mucosal breaks greater than                            5 mm, not extending between the tops of two mucosal                            folds) esophagitis with no bleeding was found 32 to                            34 cm from the incisors. Biopsies were taken with a                            cold forceps for histology. Verification of patient                            identification for the specimen was done. Estimated                            blood loss was minimal.                           A 6 cm hiatal hernia was present.                           Diffuse mild inflammation characterized by erythema                            and mottled mucosa was found in the entire examined  stomach. Biopsies were taken with a cold forceps                            for histology. Verification of patient                            identification for the specimen was done. Estimated                            blood loss was minimal.                           The cardia and gastric fundus were normal on                            retroflexion.                           The exam was otherwise without abnormality. Complications:            No immediate complications. Estimated Blood Loss:     Estimated blood loss was minimal. Impression:               - LA Grade B reflux esophagitis. Biopsied.                           - 6 cm hiatal hernia.                           - Gastritis. Biopsied.                           - The examination was otherwise normal. Recommendation:           - Patient has a contact number available for                            emergencies. The signs and symptoms of potential                            delayed  complications were discussed with the                            patient. Return to normal activities tomorrow.                            Written discharge instructions were provided to the                            patient.                           - GERD diet.                           - Continue present medications.                           -  Await pathology results.                           - Follow an antireflux regimen.                           - Use Protonix (pantoprazole) 40 mg PO daily                            [duration]. Gatha Mayer, MD 03/06/2017 8:45:44 AM This report has been signed electronically.

## 2017-03-06 NOTE — Progress Notes (Signed)
Spontaneous respirations throughout. VSS. Resting comfortably. To PACU on room air. Report to  RN. 

## 2017-03-06 NOTE — Progress Notes (Signed)
Called to room to assist during endoscopic procedure.  Patient ID and intended procedure confirmed with present staff. Received instructions for my participation in the procedure from the performing physician.  

## 2017-03-07 ENCOUNTER — Telehealth: Payer: Self-pay

## 2017-03-07 DIAGNOSIS — C9000 Multiple myeloma not having achieved remission: Secondary | ICD-10-CM | POA: Diagnosis not present

## 2017-03-07 NOTE — Telephone Encounter (Signed)
  Follow up Call-  Call Irina Okelly number 03/06/2017  Post procedure Call Liba Hulsey phone  # [phone]  Permission to leave phone message Yes  Some recent data might be hidden     Patient questions:  Do you have a fever, pain , or abdominal swelling? No. Pain Score  0 *  Have you tolerated food without any problems? Yes.    Have you been able to return to your normal activities? Yes.    Do you have any questions about your discharge instructions: Diet   No. Medications  No. Follow up visit  No.  Do you have questions or concerns about your Care? No.  Actions: * If pain score is 4 or above: No action needed, pain <4.

## 2017-03-08 ENCOUNTER — Encounter: Payer: Self-pay | Admitting: Hematology and Oncology

## 2017-03-10 ENCOUNTER — Encounter: Payer: Self-pay | Admitting: Internal Medicine

## 2017-03-21 ENCOUNTER — Other Ambulatory Visit: Payer: Self-pay | Admitting: *Deleted

## 2017-03-21 ENCOUNTER — Encounter: Payer: Self-pay | Admitting: Hematology and Oncology

## 2017-03-21 MED ORDER — MIRTAZAPINE 30 MG PO TABS: 30.0000 mg | ORAL_TABLET | Freq: Every day | ORAL | 0 refills | Status: DC

## 2017-03-21 MED ORDER — MIRTAZAPINE 30 MG PO TABS: 30.0000 mg | ORAL_TABLET | Freq: Every day | ORAL | 2 refills | Status: DC

## 2017-03-26 DIAGNOSIS — C9000 Multiple myeloma not having achieved remission: Secondary | ICD-10-CM | POA: Diagnosis not present

## 2017-03-26 DIAGNOSIS — Z9481 Bone marrow transplant status: Secondary | ICD-10-CM | POA: Diagnosis not present

## 2017-04-04 ENCOUNTER — Other Ambulatory Visit: Payer: Medicare HMO

## 2017-04-11 DIAGNOSIS — I313 Pericardial effusion (noninflammatory): Secondary | ICD-10-CM | POA: Diagnosis not present

## 2017-04-13 ENCOUNTER — Ambulatory Visit: Payer: Medicare HMO | Admitting: Hematology and Oncology

## 2017-04-20 DIAGNOSIS — D2261 Melanocytic nevi of right upper limb, including shoulder: Secondary | ICD-10-CM | POA: Diagnosis not present

## 2017-04-20 DIAGNOSIS — L821 Other seborrheic keratosis: Secondary | ICD-10-CM | POA: Diagnosis not present

## 2017-04-20 DIAGNOSIS — Z23 Encounter for immunization: Secondary | ICD-10-CM | POA: Diagnosis not present

## 2017-04-20 DIAGNOSIS — D2262 Melanocytic nevi of left upper limb, including shoulder: Secondary | ICD-10-CM | POA: Diagnosis not present

## 2017-04-20 DIAGNOSIS — D225 Melanocytic nevi of trunk: Secondary | ICD-10-CM | POA: Diagnosis not present

## 2017-04-20 DIAGNOSIS — B078 Other viral warts: Secondary | ICD-10-CM | POA: Diagnosis not present

## 2017-04-20 DIAGNOSIS — D485 Neoplasm of uncertain behavior of skin: Secondary | ICD-10-CM | POA: Diagnosis not present

## 2017-04-24 ENCOUNTER — Encounter: Payer: Self-pay | Admitting: Hematology and Oncology

## 2017-05-03 DIAGNOSIS — Z6828 Body mass index (BMI) 28.0-28.9, adult: Secondary | ICD-10-CM | POA: Diagnosis not present

## 2017-05-03 DIAGNOSIS — R509 Fever, unspecified: Secondary | ICD-10-CM | POA: Diagnosis not present

## 2017-05-03 DIAGNOSIS — R05 Cough: Secondary | ICD-10-CM | POA: Diagnosis not present

## 2017-05-03 DIAGNOSIS — J069 Acute upper respiratory infection, unspecified: Secondary | ICD-10-CM | POA: Diagnosis not present

## 2017-05-07 DIAGNOSIS — Z79899 Other long term (current) drug therapy: Secondary | ICD-10-CM | POA: Diagnosis not present

## 2017-05-07 DIAGNOSIS — C9000 Multiple myeloma not having achieved remission: Secondary | ICD-10-CM | POA: Diagnosis not present

## 2017-05-07 DIAGNOSIS — R05 Cough: Secondary | ICD-10-CM | POA: Diagnosis not present

## 2017-05-07 DIAGNOSIS — T464X5A Adverse effect of angiotensin-converting-enzyme inhibitors, initial encounter: Secondary | ICD-10-CM | POA: Diagnosis not present

## 2017-05-07 DIAGNOSIS — Z9481 Bone marrow transplant status: Secondary | ICD-10-CM | POA: Diagnosis not present

## 2017-05-07 DIAGNOSIS — D469 Myelodysplastic syndrome, unspecified: Secondary | ICD-10-CM | POA: Diagnosis not present

## 2017-05-07 DIAGNOSIS — R0981 Nasal congestion: Secondary | ICD-10-CM | POA: Diagnosis not present

## 2017-05-21 DIAGNOSIS — R05 Cough: Secondary | ICD-10-CM | POA: Diagnosis not present

## 2017-05-21 DIAGNOSIS — Z6827 Body mass index (BMI) 27.0-27.9, adult: Secondary | ICD-10-CM | POA: Diagnosis not present

## 2017-05-21 DIAGNOSIS — J40 Bronchitis, not specified as acute or chronic: Secondary | ICD-10-CM | POA: Diagnosis not present

## 2017-06-12 DIAGNOSIS — Z23 Encounter for immunization: Secondary | ICD-10-CM | POA: Diagnosis not present

## 2017-06-13 DIAGNOSIS — Z1231 Encounter for screening mammogram for malignant neoplasm of breast: Secondary | ICD-10-CM | POA: Diagnosis not present

## 2017-06-22 DIAGNOSIS — C9000 Multiple myeloma not having achieved remission: Secondary | ICD-10-CM | POA: Diagnosis not present

## 2017-06-28 DIAGNOSIS — C9000 Multiple myeloma not having achieved remission: Secondary | ICD-10-CM | POA: Diagnosis not present

## 2017-06-28 DIAGNOSIS — D469 Myelodysplastic syndrome, unspecified: Secondary | ICD-10-CM | POA: Diagnosis not present

## 2017-07-04 DIAGNOSIS — H353131 Nonexudative age-related macular degeneration, bilateral, early dry stage: Secondary | ICD-10-CM | POA: Diagnosis not present

## 2017-07-19 ENCOUNTER — Encounter: Payer: Self-pay | Admitting: Hematology and Oncology

## 2017-07-19 ENCOUNTER — Other Ambulatory Visit: Payer: Self-pay | Admitting: Hematology and Oncology

## 2017-07-19 DIAGNOSIS — Z9481 Bone marrow transplant status: Secondary | ICD-10-CM

## 2017-07-19 DIAGNOSIS — D469 Myelodysplastic syndrome, unspecified: Secondary | ICD-10-CM

## 2017-07-20 ENCOUNTER — Telehealth: Payer: Self-pay | Admitting: Hematology and Oncology

## 2017-07-20 NOTE — Telephone Encounter (Signed)
Spoke to patient regarding upcoming March appointments per 1/24 sch message

## 2017-08-15 DIAGNOSIS — R58 Hemorrhage, not elsewhere classified: Secondary | ICD-10-CM | POA: Diagnosis not present

## 2017-08-15 DIAGNOSIS — J40 Bronchitis, not specified as acute or chronic: Secondary | ICD-10-CM | POA: Diagnosis not present

## 2017-08-15 DIAGNOSIS — Z6828 Body mass index (BMI) 28.0-28.9, adult: Secondary | ICD-10-CM | POA: Diagnosis not present

## 2017-08-15 DIAGNOSIS — I10 Essential (primary) hypertension: Secondary | ICD-10-CM | POA: Diagnosis not present

## 2017-08-15 DIAGNOSIS — C9001 Multiple myeloma in remission: Secondary | ICD-10-CM | POA: Diagnosis not present

## 2017-08-30 ENCOUNTER — Encounter: Payer: Self-pay | Admitting: Hematology and Oncology

## 2017-08-30 ENCOUNTER — Other Ambulatory Visit: Payer: Self-pay | Admitting: Hematology and Oncology

## 2017-08-31 ENCOUNTER — Ambulatory Visit (INDEPENDENT_AMBULATORY_CARE_PROVIDER_SITE_OTHER): Payer: Medicare HMO

## 2017-08-31 ENCOUNTER — Encounter (INDEPENDENT_AMBULATORY_CARE_PROVIDER_SITE_OTHER): Payer: Self-pay | Admitting: Vascular Surgery

## 2017-08-31 ENCOUNTER — Ambulatory Visit (INDEPENDENT_AMBULATORY_CARE_PROVIDER_SITE_OTHER): Payer: Medicare HMO | Admitting: Vascular Surgery

## 2017-08-31 ENCOUNTER — Other Ambulatory Visit: Payer: Self-pay | Admitting: Hematology and Oncology

## 2017-08-31 VITALS — BP 146/87 | HR 74 | Resp 17 | Wt 162.8 lb

## 2017-08-31 DIAGNOSIS — C9002 Multiple myeloma in relapse: Secondary | ICD-10-CM

## 2017-08-31 DIAGNOSIS — I6523 Occlusion and stenosis of bilateral carotid arteries: Secondary | ICD-10-CM | POA: Diagnosis not present

## 2017-08-31 DIAGNOSIS — I10 Essential (primary) hypertension: Secondary | ICD-10-CM

## 2017-08-31 MED ORDER — MIRTAZAPINE 30 MG PO TABS: 30.0000 mg | ORAL_TABLET | Freq: Every day | ORAL | 0 refills | Status: DC

## 2017-08-31 NOTE — Patient Instructions (Signed)
Carotid Artery Disease The carotid arteries are arteries on both sides of the neck. They carry blood to the brain. Carotid artery disease is when the arteries get smaller (narrow) or get blocked. If these arteries get smaller or get blocked, you are more likely to have a stroke or warning stroke (transient ischemic attack). Follow these instructions at home:  Take medicines as told by your doctor. Make sure you understand all your medicine instructions. Do not stop your medicines without talking to your doctor first.  Follow your doctor's diet instructions. It is important to eat a healthy diet that includes plenty of: ? Fresh fruits. ? Vegetables. ? Lean meats.  Avoid: ? High-fat foods. ? High-sodium foods. ? Foods that are fried, overly processed, or have poor nutritional value.  Stay a healthy weight.  Stay active. Get at least 30 minutes of activity every day.  Do not smoke.  Limit alcohol use to: ? No more than 2 drinks a day for men. ? No more than 1 drink a day for women who are not pregnant.  Do not use illegal drugs.  Keep all doctor visits as told. Get help right away if:  You have sudden weakness or loss of feeling (numbness) on one side of the body, such as the face, arm, or leg.  You have sudden confusion.  You have trouble speaking (aphasia) or understanding.  You have sudden trouble seeing out of one or both eyes.  You have sudden trouble walking.  You have dizziness or feel like you might pass out (faint).  You have a loss of balance or your movements are not steady (uncoordinated).  You have a sudden, severe headache with no known cause.  You have trouble swallowing (dysphagia). Call your local emergency services (911 in [place] Do notdrive yourself to the clinic or hospital. This information is not intended to replace advice given to you by your health care provider. Make sure you discuss any questions you have with your health care  provider. Document Released: 05/29/2012 Document Revised: 11/18/2015 Document Reviewed: 12/11/2012 Elsevier Interactive Patient Education  2018 Elsevier Inc.  

## 2017-08-31 NOTE — Assessment & Plan Note (Signed)
Her carotid duplex today reveals stable, very mild carotid disease in the 1-39% range bilaterally.  Well below the threshold for intervention.  We will stretch out her follow-ups to every other year at this point.

## 2017-08-31 NOTE — Progress Notes (Signed)
MRN : [id number]  Jean Davidson is a 72 y.o. (03-08-1946) female who presents with chief complaint of  Chief Complaint  Patient presents with  . Follow-up    32yr carotid ultrasound  .  History of Present Illness: Patient returns in follow-up of her carotid disease.  She is doing well without specific complaints today.  She denies any focal neurologic symptoms. Specifically, the patient denies amaurosis fugax, speech or swallowing difficulties, or arm or leg weakness or numbness. Her carotid duplex today reveals stable, very mild carotid disease in the 1-39% range bilaterally.  Current Outpatient Prescriptions  Medication Sig Dispense Refill  . acyclovir (ZOVIRAX) 400 MG tablet TAKE 1 TABLET (400 MG TOTAL) BY MOUTH 2 (TWO) TIMES DAILY. 180 tablet 2  . carvedilol (COREG) 6.25 MG tablet Take 6.25 mg by mouth 2 (two) times daily.    . Cholecalciferol (VITAMIN D-1000 MAX ST) 1000 units tablet Take 1,000 Units by mouth daily.    Marland Kitchen loperamide (IMODIUM) 2 MG capsule Take by mouth as needed for diarrhea or loose stools.    Marland Kitchen LORazepam (ATIVAN) 0.5 MG tablet Take 0.5 mg by mouth 2 (two) times daily as needed.    . mirtazapine (REMERON) 30 MG tablet Take 1 tablet (30 mg total) by mouth at bedtime. 90 tablet 3  . Multiple Vitamin (MULTIVITAMIN WITH MINERALS) TABS tablet Take 1 tablet by mouth daily.    . Multiple Vitamins-Minerals (ICAPS AREDS 2 PO) Take 1 tablet by mouth 2 (two) times daily.    . pantoprazole (PROTONIX) 40 MG tablet Take 40 mg by mouth every 12 (twelve) hours.    Marland Kitchen UNABLE TO FIND 2 (two) times daily. Med Name: Magnesium plus protein     . Melatonin 300 MCG TABS Take 300 mcg by mouth at bedtime as needed.    . prochlorperazine (COMPAZINE) 5 MG tablet Take 5 mg by mouth every 6 (six) hours as needed.     No current facility-administered medications for this visit.         Past Medical History:  Diagnosis Date  . Anemia   . Anxiety   . Cancer ([place])    . Carotid stenosis    Mild bilateral  . Carpal tunnel syndrome   . Cervical disc disease    c7  . Cough 07/27/2015  . Disc degeneration   . Dysuria 07/26/2015  . 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 Fatigue 01/26/2015  . Fever 07/27/2015  . GERD (gastroesophageal reflux disease)   . 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 Hypertension    Borderline.  . Hypokalemia 07/16/2015  . Pinched nerve   . Sinusitis, bacterial 07/27/2015  . Spinal stenosis in cervical region   . Varicose veins of lower extremities with inflammation          Past Surgical History:  Procedure Laterality Date  . bladder tact  1988/1989   x2  . COLONOSCOPY    . PORT-A-CATH REMOVAL    . SHOULDER SURGERY Right 04/06/13   right    Social History        Social History  Substance Use Topics  . Smoking status: Former Smoker    Packs/day: 0.50    Years: 4.00    Quit date: 06/26/1968  . Smokeless tobacco: Never Used  . Alcohol use 0.6 oz/week     1 Cans of beer per week      Comment: minimal  No IVDU  Family History  Family History  Problem Relation Age of Onset  . Heart disease Mother   . Heart failure Father   . Heart disease Father   . Throat cancer Father   . Skin cancer Father   . Diabetes Father   . Kidney disease Father   . Hypertension Brother   . Heart disease Brother     congenital shunt, ?   . Colon cancer Paternal Aunt 72  . Skin cancer Brother      No Known Allergies   REVIEW OF SYSTEMS (Negative unless checked)  Constitutional: [] Weight loss  [] Fever  [] Chills Cardiac: [] Chest pain   [] Chest pressure   [] Palpitations   [] Shortness of breath when laying flat   [] Shortness of breath at rest   [] Shortness of breath with exertion. Vascular:  [] Pain in legs with walking   [] Pain in legs at rest   [] Pain in legs when laying flat   [] Claudication   [] Pain in feet when walking  [] Pain in feet at rest  [] Pain in feet when  laying flat   [] History of DVT   [] Phlebitis   [] Swelling in legs   [] Varicose veins   [] Non-healing ulcers Pulmonary:   [] Uses home oxygen   [] Productive cough   [] Hemoptysis   [] Wheeze  [] COPD   [] Asthma Neurologic:  [] Dizziness  [] Blackouts   [] Seizures   [] History of stroke   [] History of TIA  [] Aphasia   [] Temporary blindness   [] Dysphagia   [] Weakness or numbness in arms   [x] Weakness or numbness in legs Musculoskeletal:  [] Arthritis   [] Joint swelling   [] Joint pain   [] Low back pain Hematologic:  [] Easy bruising  [] Easy bleeding   [] Hypercoagulable state   [] Anemic  [] Hepatitis Gastrointestinal:  [] Blood in stool   [] Vomiting blood  [] Gastroesophageal reflux/heartburn   [] Difficulty swallowing. Genitourinary:  [x] Chronic kidney disease   [] Difficult urination  [] Frequent urination  [] Burning with urination   [] Blood in urine Skin:  [] Rashes   [] Ulcers   [] Wounds Psychological:  [] History of anxiety   []  History of major depression.      Physical Examination  Vitals:   08/31/17 1011  BP: (!) 146/87  Pulse: 74  Resp: 17  Weight: 73.8 kg (162 lb 12.8 oz)   Body mass index is 28.84 kg/m. Gen:  WD/WN, NAD Head: [place]/AT, No temporalis wasting. Ear/Nose/Throat: Hearing grossly intact, nares w/o erythema or drainage, trachea midline Eyes: Conjunctiva clear. Sclera non-icteric Neck: Supple.  No bruits.  Pulmonary:  Good air movement, equal and clear to auscultation bilaterally.  Cardiac: RRR, no JVD Vascular:  Vessel Right Left  Radial Palpable Palpable                                    Musculoskeletal: M/S 5/5 throughout.  No deformity or atrophy.  No edema. Neurologic: CN 2-12 intact. Sensation grossly intact in extremities.  Symmetrical.  Speech is fluent. Motor exam as listed above. Psychiatric: Judgment intact, Mood & affect appropriate for pt's clinical situation. Dermatologic: No rashes or ulcers noted.  No cellulitis or open wounds.      CBC Lab Results    Component Value Date   WBC 4.8 01/04/2017   HGB 12.5 01/04/2017   HCT 36.5 01/04/2017   MCV 103.1 (H) 01/04/2017   PLT 159 01/04/2017    BMET    Component Value Date/Time   NA 142 01/04/2017 0902   K 3.9 01/04/2017  0902   CL 106 08/06/2015 0530   CO2 28 01/04/2017 0902   GLUCOSE 92 01/04/2017 0902   BUN 9.5 01/04/2017 0902   CREATININE 0.7 01/04/2017 0902   CALCIUM 9.0 01/04/2017 0902   GFRNONAA >60 08/06/2015 0530   GFRAA >60 08/06/2015 0530   CrCl cannot be calculated (Patient's most recent lab result is older than the maximum 21 days allowed.).  COAG Lab Results  Component Value Date   INR 1.44 08/04/2015   INR 1.08 10/22/2014   INR 1.12 04/23/2014    Radiology No results found.    Assessment/Plan Essential hypertension blood pressure control important in reducing the progression of atherosclerotic disease. On appropriate oral medications.   Carotid stenosis Her carotid duplex today reveals stable, very mild carotid disease in the 1-39% range bilaterally.  Well below the threshold for intervention.  We will stretch out her follow-ups to every other year at this point.    Leotis Pain, MD  08/31/2017 10:37 AM    This note was created with Dragon medical transcription system.  Any errors from dictation are purely unintentional

## 2017-09-06 ENCOUNTER — Inpatient Hospital Stay: Payer: Medicare HMO | Attending: Hematology and Oncology

## 2017-09-06 ENCOUNTER — Inpatient Hospital Stay: Payer: Medicare HMO | Admitting: Hematology and Oncology

## 2017-09-06 ENCOUNTER — Encounter: Payer: Self-pay | Admitting: Hematology and Oncology

## 2017-09-06 DIAGNOSIS — B372 Candidiasis of skin and nail: Secondary | ICD-10-CM | POA: Diagnosis not present

## 2017-09-06 DIAGNOSIS — C9002 Multiple myeloma in relapse: Secondary | ICD-10-CM

## 2017-09-06 DIAGNOSIS — D469 Myelodysplastic syndrome, unspecified: Secondary | ICD-10-CM | POA: Insufficient documentation

## 2017-09-06 DIAGNOSIS — Z9481 Bone marrow transplant status: Secondary | ICD-10-CM

## 2017-09-06 DIAGNOSIS — Z79899 Other long term (current) drug therapy: Secondary | ICD-10-CM | POA: Diagnosis not present

## 2017-09-06 LAB — COMPREHENSIVE METABOLIC PANEL
ALT: 20 U/L (ref 0–55)
AST: 22 U/L (ref 5–34)
Albumin: 3.8 g/dL (ref 3.5–5.0)
Alkaline Phosphatase: 98 U/L (ref 40–150)
Anion gap: 9 (ref 3–11)
BUN: 13 mg/dL (ref 7–26)
CO2: 25 mmol/L (ref 22–29)
Calcium: 9.6 mg/dL (ref 8.4–10.4)
Chloride: 106 mmol/L (ref 98–109)
Creatinine, Ser: 0.83 mg/dL (ref 0.60–1.10)
GFR calc Af Amer: >60 mL/min (ref >60)
GFR calc non Af Amer: >60 mL/min (ref >60)
Glucose, Bld: 91 mg/dL (ref 70–140)
Potassium: 4.1 mmol/L (ref 3.5–5.1)
Sodium: 140 mmol/L (ref 136–145)
Total Bilirubin: 0.3 mg/dL (ref 0.2–1.2)
Total Protein: 6.9 g/dL (ref 6.4–8.3)

## 2017-09-06 LAB — CBC WITH DIFFERENTIAL/PLATELET
Basophils Absolute: 0 10*3/uL (ref 0.0–0.1)
Basophils Relative: 0 %
Eosinophils Absolute: 0 10*3/uL (ref 0.0–0.5)
Eosinophils Relative: 1 %
HCT: 45.7 % (ref 34.8–46.6)
Hemoglobin: 15.1 g/dL (ref 11.6–15.9)
Lymphocytes Relative: 48 %
Lymphs Abs: 3.1 10*3/uL (ref 0.9–3.3)
MCH: 34.3 pg — ABNORMAL HIGH (ref 25.1–34.0)
MCHC: 33.2 g/dL (ref 31.5–36.0)
MCV: 103.3 fL — ABNORMAL HIGH (ref 79.5–101.0)
Monocytes Absolute: 0.6 10*3/uL (ref 0.1–0.9)
Monocytes Relative: 9 %
Neutro Abs: 2.8 10*3/uL (ref 1.5–6.5)
Neutrophils Relative %: 42 %
Platelets: 192 10*3/uL (ref 145–400)
RBC: 4.42 MIL/uL (ref 3.70–5.45)
RDW: 12.4 % (ref 11.2–14.5)
WBC: 6.5 10*3/uL (ref 3.9–10.3)

## 2017-09-06 LAB — MAGNESIUM: Magnesium: 2.3 mg/dL (ref 1.5–2.5)

## 2017-09-06 LAB — LACTATE DEHYDROGENASE: LDH: 161 U/L (ref 125–245)

## 2017-09-06 LAB — PHOSPHORUS: Phosphorus: 3.7 mg/dL (ref 2.5–4.6)

## 2017-09-06 MED ORDER — NYSTATIN 100000 UNIT/GM EX POWD: Freq: Four times a day (QID) | CUTANEOUS | 0 refills | Status: DC

## 2017-09-06 NOTE — Assessment & Plan Note (Signed)
Myeloma panel is still pending.   overall, she is not symptomatic I recommend close observation only I recommend she takes vitamin D and calcium supplement We will call her with test results.

## 2017-09-06 NOTE — Assessment & Plan Note (Signed)
She is fully engrafted with normal CBC She will continue close monitoring and follow-up

## 2017-09-06 NOTE — Assessment & Plan Note (Signed)
She has signs of yeast infection under her bra line I recommend nystatin powder

## 2017-09-06 NOTE — Assessment & Plan Note (Signed)
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

## 2017-09-06 NOTE — Progress Notes (Signed)
[place] OFFICE PROGRESS NOTE  Patient Care Team: [name], [name], MD as PCP - General (Internal Medicine) [name], MD as Referring Physician (Hematology and Oncology) [name], MD as Consulting Physician (Internal Medicine) [name], [name], MD as Consulting Physician (Infectious Diseases) [name], MD as Consulting Physician (Hematology and Oncology) [name], NP as Nurse Practitioner (Hematology and Oncology)  ASSESSMENT & PLAN:  MDS/MPN (myelodysplastic/myeloproliferative neoplasms) [facility]) She is fully engrafted with normal CBC She will continue close monitoring and follow-up  Multiple myeloma [facility]) Myeloma panel is still pending.   overall, she is not symptomatic I recommend close observation only I recommend she takes vitamin D and calcium supplement We will call her with test results.  Status post allogeneic bone marrow transplant [facility]) She has no recent infection She is fully engrafted with no signs of graft-versus-host disease She has appointment to return back to [facility] in May, August and November I would defer to them for further follow-up  Yeast infection of the skin She has signs of yeast infection under her bra line I recommend nystatin powder   No orders of the defined types were placed in this encounter.   INTERVAL HISTORY: Please see below for problem oriented charting. She returns for further follow-up She is doing very well She exercises regularly She denies recent infection No graft-versus-host disease Her appetite is stable and she is gaining some weight She complained of skin rash under her bra line  SUMMARY OF ONCOLOGIC HISTORY: Oncology History   Multiple myeloma, Ig A Lambda, M spike 3.54 grams, Calcium 9.2, Creatinine 0.8, Beta 2 microglobulin 4.52, IgA 4840 mg/dL, lambda light chain 75.4, albumin 3.6, hemoglobin 9.7, platelet 115    Primary site: Multiple Myeloma  Staging method: AJCC 6th Edition   Clinical: Stage IIA signed by [name], MD on 11/07/2013  2:46 PM   Summary: Stage IIA        Multiple myeloma ([place])   10/31/2013 Bone Marrow Biopsy    Bone marrow biopsy confirmed multiple myeloma with 40% bone marrow involvement. Skeletal survey showed minimal lesions in her score with generalized demineralization      11/10/2013 - 02/13/2014 Chemotherapy    The patient is started on induction chemotherapy with weekly dexamethasone 40 mg by mouth as well as Velcade subcutaneous injection on days 1, 4, 8 and 11. On 11/21/2013, she was started on monthly Zometa.      12/23/2013 Adverse Reaction    The dose of Velcade was reduced due to thrombocytopenia.      01/28/2014 - 04/07/2014 Chemotherapy    Revlimid is added. Treatment was discontinued due to lack of response.      02/24/2014 - 04/07/2014 Chemotherapy    Due to worsening peripheral neuropathy, Velcade injection is changed to once a week. Revlimid was given 21 days on, 7 days off.      04/07/2014 - 04/10/2014 Chemotherapy    Revlimid was discontinued due to lack of response. Chemotherapy was changed back to Velcade injection twice a week, 2 weeks on 1 week off. Her treatment was switched to to minimum response      04/20/2014 - 06/02/2014 Chemotherapy    chemotherapy is switched to Carfilzomib, Cytoxan and dexamethasone.      04/22/2014 Procedure    she has placement of port for chemotherapy.      06/01/2014 Tumor Marker    Bloodwork show that she has greater than partial response  06/23/2014 Bone Marrow Biopsy    Bone marrow biopsy show 5-10% residual plasma cells, normal cytogenetics and FISH      07/07/2014 Procedure    She had stem cell collection      07/22/2014 - 07/22/2014 Chemotherapy    She had high-dose chemotherapy with melphalan      07/23/2014 Bone Marrow Transplant    She had bone marrow transplant in autologous fashion at [facility]      10/20/2014 - 03/24/2015  Chemotherapy     she received chemotherapy with Kyprolis, Revlimid and dexamethasone      10/22/2014 Procedure    She has port placement      01/19/2015 Tumor Marker    IgA lambda M spike at 0.4 g       01/20/2015 Miscellaneous    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 No cource was found and fever resolved with IV vancomycin and meropenem      09/13/2015 Bone Marrow Biopsy    Bone marrow biopsy showed no increased blasts, 3-4 % plasma cells      03/02/2016 Bone Marrow Biopsy    Bone marrow biopsy at [facility] showed normocellular (30%) bone marrow with trilineage hematopoiesis. No significant increase in blasts. No significant increase in plasma cells.      05/12/2016 Imaging    DEXA scan at [facility] showed osteopenia      10/24/2016 Imaging    Skeletal survey at [facility], no new lesions       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[place])   04/06/2015 Bone Marrow Biopsy    Accession: [id number] BM biopsy showed RAEB-1      04/06/2015 Tumor Marker    Cytogenetics and FISH for MDS are within normal limits      10/06/2015 - 10/10/2015 Chemotherapy    She received conditioning chemotherapy with busulfan and melphalan      10/12/2015 Bone Marrow Transplant    She received allogenic stem cell transplant      10/19/2015 Adverse Reaction    She developed posttransplant complication with mucositis, viral infection with rhinovirus, neutropenic fever, bilateral pleural effusion and moderate pericardial effusion and  CMV reactivation.      10/31/2015 Miscellaneous    She has engrafted       REVIEW OF SYSTEMS:   Constitutional: Denies fevers, chills or abnormal weight loss Eyes: Denies blurriness of vision Ears, nose, mouth, throat, and face: Denies mucositis or sore throat Respiratory: Denies cough, dyspnea or wheezes Cardiovascular: Denies palpitation, chest discomfort or lower extremity swelling Gastrointestinal:  Denies nausea, heartburn or change in bowel habits Lymphatics: Denies new lymphadenopathy or easy bruising Neurological:Denies numbness, tingling or new weaknesses Behavioral/Psych: Mood is stable, no new changes  All other systems were reviewed with the patient and are negative.  I have reviewed the past medical history, past surgical history, social history and family history with the patient and they are unchanged from previous note.  ALLERGIES:  has No Known Allergies.  MEDICATIONS:  Current Outpatient Medications  Medication Sig Dispense Refill  . carvedilol (COREG) 3.125 MG tablet Take 3.125 mg by mouth 2 (two) times daily with a meal.    . Cholecalciferol (VITAMIN D-1000 MAX ST) 1000 units tablet Take 1,000 Units by mouth daily.    . mirtazapine (REMERON) 30 MG tablet Take 1 tablet (30 mg total) by mouth at bedtime. 90 tablet 0  . Multiple Vitamin (MULTIVITAMIN WITH MINERALS) TABS tablet Take 1 tablet by mouth daily.    Marland Kitchen nystatin (MYCOSTATIN/NYSTOP) powder Apply topically 4 (four) times daily. 30 g 0  . docusate sodium (COLACE) 100 MG capsule Take by mouth daily as needed.     . loperamide (IMODIUM) 2 MG capsule Take by mouth as needed for diarrhea or loose stools. As needed only    . pantoprazole (PROTONIX) 40 MG tablet Take 1 tablet (40 mg total) by mouth daily before breakfast. 90 tablet 3   No current facility-administered medications for this visit.     PHYSICAL EXAMINATION: ECOG PERFORMANCE STATUS: 1 - Symptomatic but completely ambulatory  Vitals:   09/06/17  1239  BP: (!) 153/83  Pulse: 81  Resp: 16  Temp: 98.9 F (37.2 C)  SpO2: 98%   Filed Weights   09/06/17 1239  Weight: 165 lb 3.2 oz (74.9 kg)    GENERAL:alert, no distress and comfortable SKIN: She had skin rash under her bra line consistent with yeast infection EYES: normal, Conjunctiva are pink and non-injected, sclera clear OROPHARYNX:no exudate, no erythema and lips, buccal mucosa, and tongue normal  NECK: supple, thyroid normal size, non-tender, without nodularity LYMPH:  no palpable lymphadenopathy in the cervical, axillary or inguinal LUNGS: clear to auscultation and percussion with normal breathing effort HEART: regular rate & rhythm and no murmurs and no lower extremity edema ABDOMEN:abdomen soft, non-tender and normal bowel sounds Musculoskeletal:no cyanosis of digits and no clubbing  NEURO: alert & oriented x 3 with fluent speech, no focal motor/sensory deficits  LABORATORY DATA:  I have reviewed the data as listed    Component Value Date/Time   NA 140 09/06/2017 1210   NA 142 01/04/2017 0902   K 4.1 09/06/2017 1210   K 3.9 01/04/2017 0902   CL 106 09/06/2017 1210   CO2 25 09/06/2017 1210   CO2 28 01/04/2017 0902   GLUCOSE 91 09/06/2017 1210   GLUCOSE 92 01/04/2017 0902   BUN 13 09/06/2017 1210   BUN 9.5 01/04/2017 0902   CREATININE 0.83 09/06/2017 1210   CREATININE 0.7 01/04/2017 0902   CALCIUM 9.6 09/06/2017 1210   CALCIUM 9.0 01/04/2017 0902   PROT 6.9 09/06/2017 1210   PROT 6.0 (L) 01/04/2017 0902   PROT 5.8 (L) 01/04/2017 0902   ALBUMIN 3.8 09/06/2017 1210   ALBUMIN 3.4 (L) 01/04/2017 0902   AST 22 09/06/2017 1210   AST 21 01/04/2017 0902   ALT 20 09/06/2017 1210   ALT 16 01/04/2017 0902   ALKPHOS 98 09/06/2017 1210   ALKPHOS 101 01/04/2017 0902   BILITOT 0.3 09/06/2017 1210   BILITOT 0.56 01/04/2017 0902   GFRNONAA >60 09/06/2017 1210   GFRAA >60 09/06/2017 1210    No results found for: SPEP, UPEP  Lab Results  Component Value Date    WBC 6.5 09/06/2017   NEUTROABS 2.8 09/06/2017   HGB 15.1 09/06/2017   HCT 45.7 09/06/2017   MCV 103.3 (H) 09/06/2017   PLT 192 09/06/2017      Chemistry      Component Value Date/Time  NA 140 09/06/2017 1210   NA 142 01/04/2017 0902   K 4.1 09/06/2017 1210   K 3.9 01/04/2017 0902   CL 106 09/06/2017 1210   CO2 25 09/06/2017 1210   CO2 28 01/04/2017 0902   BUN 13 09/06/2017 1210   BUN 9.5 01/04/2017 0902   CREATININE 0.83 09/06/2017 1210   CREATININE 0.7 01/04/2017 0902      Component Value Date/Time   CALCIUM 9.6 09/06/2017 1210   CALCIUM 9.0 01/04/2017 0902   ALKPHOS 98 09/06/2017 1210   ALKPHOS 101 01/04/2017 0902   AST 22 09/06/2017 1210   AST 21 01/04/2017 0902   ALT 20 09/06/2017 1210   ALT 16 01/04/2017 0902   BILITOT 0.3 09/06/2017 1210   BILITOT 0.56 01/04/2017 0902      All questions were answered. The patient knows to call the clinic with any problems, questions or concerns. No barriers to learning was detected.  I spent 15 minutes counseling the patient face to face. The total time spent in the appointment was 20 minutes and more than 50% was on counseling and review of test results  [name], MD 09/06/2017 1:46 PM

## 2017-09-07 LAB — KAPPA/LAMBDA LIGHT CHAINS
Kappa free light chain: 13.4 mg/L (ref 3.3–19.4)
Kappa, lambda light chain ratio: 0.4 (ref 0.26–1.65)
Lambda free light chains: 33.2 mg/L — ABNORMAL HIGH (ref 5.7–26.3)

## 2017-09-07 LAB — BETA 2 MICROGLOBULIN, SERUM: Beta-2 Microglobulin: 2.2 mg/L (ref 0.6–2.4)

## 2017-09-09 ENCOUNTER — Other Ambulatory Visit: Payer: Self-pay | Admitting: Cardiovascular Disease

## 2017-09-10 LAB — MULTIPLE MYELOMA PANEL, SERUM
Albumin SerPl Elph-Mcnc: 3.5 g/dL (ref 2.9–4.4)
Albumin/Glob SerPl: 1.2 (ref 0.7–1.7)
Alpha 1: 0.2 g/dL (ref 0.0–0.4)
Alpha2 Glob SerPl Elph-Mcnc: 0.8 g/dL (ref 0.4–1.0)
B-Globulin SerPl Elph-Mcnc: 1.1 g/dL (ref 0.7–1.3)
Gamma Glob SerPl Elph-Mcnc: 0.8 g/dL (ref 0.4–1.8)
Globulin, Total: 3 g/dL (ref 2.2–3.9)
IgA: 252 mg/dL (ref 64–422)
IgG (Immunoglobin G), Serum: 685 mg/dL — ABNORMAL LOW (ref 700–1600)
IgM (Immunoglobulin M), Srm: 174 mg/dL (ref 26–217)
M Protein SerPl Elph-Mcnc: 0.2 g/dL — ABNORMAL HIGH
Total Protein ELP: 6.5 g/dL (ref 6.0–8.5)

## 2017-09-11 ENCOUNTER — Telehealth: Payer: Self-pay

## 2017-09-11 NOTE — Telephone Encounter (Signed)
Called with below message. Verbalized understanding. 

## 2017-09-11 NOTE — Telephone Encounter (Signed)
-----   Message from [name], MD sent at 09/11/2017  8:01 AM EDT ----- Regarding: myeloma panel PLs let her know myeloma panel mildly positive Not much change ----- Message ----- From: Interface, Lab In [place] Sent: 09/06/2017  12:31 PM To: [name], MD

## 2017-09-14 ENCOUNTER — Encounter: Payer: Self-pay | Admitting: Hematology and Oncology

## 2017-10-29 DIAGNOSIS — C9001 Multiple myeloma in remission: Secondary | ICD-10-CM | POA: Diagnosis not present

## 2017-10-29 DIAGNOSIS — Z9481 Bone marrow transplant status: Secondary | ICD-10-CM | POA: Diagnosis not present

## 2017-10-29 DIAGNOSIS — C9000 Multiple myeloma not having achieved remission: Secondary | ICD-10-CM | POA: Diagnosis not present

## 2017-10-31 ENCOUNTER — Encounter: Payer: Self-pay | Admitting: Hematology and Oncology

## 2017-11-01 ENCOUNTER — Other Ambulatory Visit: Payer: Self-pay | Admitting: Hematology and Oncology

## 2017-11-01 MED ORDER — NYSTATIN 100000 UNIT/GM EX POWD: Freq: Four times a day (QID) | CUTANEOUS | 1 refills | Status: DC

## 2017-11-02 ENCOUNTER — Ambulatory Visit: Payer: Medicare HMO | Admitting: Cardiovascular Disease

## 2017-11-02 ENCOUNTER — Encounter: Payer: Self-pay | Admitting: Cardiovascular Disease

## 2017-11-02 VITALS — BP 120/78 | HR 82 | Ht 63.0 in | Wt 167.8 lb

## 2017-11-02 DIAGNOSIS — R002 Palpitations: Secondary | ICD-10-CM

## 2017-11-02 DIAGNOSIS — I313 Pericardial effusion (noninflammatory): Secondary | ICD-10-CM | POA: Diagnosis not present

## 2017-11-02 DIAGNOSIS — I3139 Other pericardial effusion (noninflammatory): Secondary | ICD-10-CM

## 2017-11-02 DIAGNOSIS — I10 Essential (primary) hypertension: Secondary | ICD-10-CM

## 2017-11-02 NOTE — Patient Instructions (Signed)
Medication Instructions: No change    Labwork: None.   Procedures/Testing: None.   Follow-Up: 1 year with Dr. Fletcher Anon.   Any Additional Special Instructions Will Be Listed Below (If Applicable).     If you need a refill on your cardiac medications before your next appointment, please call your pharmacy.

## 2017-11-02 NOTE — Progress Notes (Signed)
Cardiology Office Note   Date:  11/02/2017   ID:  Jean Davidson, DOB 08-24-1945, MRN [id number]  PCP:  Haywood Pao, MD  Cardiologist:   Kathlyn Sacramento, MD   Chief Complaint  Patient presents with  . other    Pt. last seen in 2016 by Dr. Fletcher Anon; would like to [facility] care. The patient had been followed by [facility] with the most recent visit with [facility] in 03/2017 with a pericardial effusion. "doing well." Denies chest pain or shortness of breath.      History of Present Illness: Jean Davidson is a 71 y.o. female who presents to reestablish cardiovascular care.  She was seen by me in 2016 due to palpitations related to premature beats.  A Holter monitor  showed one PVC as well as few PACs. An echocardiogram overall was normal. A treadmill stress test was normal. Due to hypertension and palpitations, she was started on carvedilol. No cardiac events since then.  She has known history of multiple myeloma refractory to treatment.  She has been followed closely at [facility] and underwent bone marrow transplant in 2016 with relapsing disease.  She did develop large large pericardial effusion at some point and was seen by [facility] cardiology.  Initially, pericardiocentesis was planned but the effusion decreased in size without intervention.  She had an echo done in early 2018 which showed only small pericardial effusion.  She has been doing reasonably well from a cardiac standpoint with no recent chest pain, shortness of breath or palpitations.  Past Medical History:  Diagnosis Date  . Anemia   . Anxiety   . Blood transfusion without reported diagnosis   . Cancer ([place])    remission for over 1 year  . Carotid stenosis    Mild bilateral  . Carpal tunnel syndrome   . Cervical disc disease    c7  . Cough 07/27/2015  . Disc degeneration   . Dysuria 07/26/2015  . Failure of stem cell transplant ([place])   . Fatigue 01/26/2015  . Fever 07/27/2015  . GERD (gastroesophageal reflux  disease)   . 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 Hypertension    Borderline.  . Hypokalemia 07/16/2015  . Internal and external hemorrhoids without complication   . Pinched nerve   . Sinusitis, bacterial 07/27/2015  . Spinal stenosis in cervical region   . Varicose veins of lower extremities with inflammation     Past Surgical History:  Procedure Laterality Date  . bladder tact  1988/1989   x2  . COLONOSCOPY    . PORT-A-CATH REMOVAL    . SHOULDER SURGERY Right 04/06/13   right     Current Outpatient Medications  Medication Sig Dispense Refill  . carvedilol (COREG) 3.125 MG tablet Take 3.125 mg by mouth 2 (two) times daily with a meal.    . Cholecalciferol (VITAMIN D-1000 MAX ST) 1000 units tablet Take 1,000 Units by mouth daily.    Marland Kitchen docusate sodium (COLACE) 100 MG capsule Take by mouth daily as needed.     . loperamide (IMODIUM) 2 MG capsule Take by mouth as needed for diarrhea or loose stools. As needed only    . mirtazapine (REMERON) 30 MG tablet Take 1 tablet (30 mg total) by mouth at bedtime. 90 tablet 0  . Multiple Vitamin (MULTIVITAMIN WITH MINERALS) TABS tablet Take 1 tablet by mouth daily.    Marland Kitchen nystatin (MYCOSTATIN/NYSTOP) powder Apply topically 4 (four) times daily. 30 g 1  . pantoprazole (PROTONIX)  40 MG tablet Take 1 tablet (40 mg total) by mouth daily before breakfast. 90 tablet 3   No current facility-administered medications for this visit.     Allergies:   Patient has no known allergies.    Social History:  The patient  reports that she quit smoking about 49 years ago. She has a 2.00 pack-year smoking history. She has never used smokeless tobacco. She reports that she drinks about 0.6 oz of alcohol per week. She reports that she does not use drugs.   Family History:  The patient's family history includes Colon cancer (age of onset: 12) in her paternal aunt; Diabetes in her father; Heart disease in her brother, father, and mother; Heart failure in her father;  Hypertension in her brother; Kidney disease in her father; Skin cancer in her brother and father; Throat cancer in her father.    ROS:  Please see the history of present illness.   Otherwise, review of systems are positive for none.   All other systems are reviewed and negative.    PHYSICAL EXAM: VS:  BP 120/78 (BP Location: Left Arm, Patient Position: Sitting, Cuff Size: Normal)   Pulse 82   Ht '5\' 3"'  (1.6 m)   Wt 167 lb 12 oz (76.1 kg)   BMI 29.72 kg/m  , BMI Body mass index is 29.72 kg/m. GEN: Well nourished, well developed, in no acute distress  HEENT: normal  Neck: no JVD, carotid bruits, or masses Cardiac: RRR; no murmurs, rubs, or gallops,no edema  Respiratory:  clear to auscultation bilaterally, normal work of breathing GI: soft, nontender, nondistended, + BS MS: no deformity or atrophy  Skin: warm and dry, no rash Neuro:  Strength and sensation are intact Psych: euthymic mood, full affect   EKG:  EKG is ordered today. The ekg ordered today demonstrates normal sinus rhythm with possible left atrial enlargement.  No significant ST or T wave changes.   Recent Labs: 01/04/2017: TSH 1.461 09/06/2017: ALT 20; BUN 13; Creatinine, Ser 0.83; Hemoglobin 15.1; Magnesium 2.3; Platelets 192; Potassium 4.1; Sodium 140    Lipid Panel    Component Value Date/Time   CHOL 110 08/08/2013 1127   TRIG 82.0 08/08/2013 1127   HDL 36.50 (L) 08/08/2013 1127   CHOLHDL 3 08/08/2013 1127   VLDL 16.4 08/08/2013 1127   LDLCALC 57 08/08/2013 1127      Wt Readings from Last 3 Encounters:  11/02/17 167 lb 12 oz (76.1 kg)  09/06/17 165 lb 3.2 oz (74.9 kg)  08/31/17 162 lb 12.8 oz (73.8 kg)       No flowsheet data found.    ASSESSMENT AND PLAN:  1.  History of pericardial effusion: Currently with no symptoms.  Her cardiac exam is unremarkable and EKG does not show low voltage.  Most recent echocardiogram in 2018 showed only small pericardial effusion.  Continue to monitor clinically.   No need to repeat an echocardiogram at the present time.  2.  History of palpitations due to premature beats: Controlled with small dose carvedilol.  3.  Essential hypertension: Blood pressure is controlled.  4.  Multiple myeloma: Followed at [facility].    Disposition:   FU with me in 1 year  Signed,  Kathlyn Sacramento, MD  11/02/2017 2:02 PM    [facility]

## 2017-11-05 DIAGNOSIS — C9000 Multiple myeloma not having achieved remission: Secondary | ICD-10-CM | POA: Diagnosis not present

## 2017-11-05 DIAGNOSIS — Z9481 Bone marrow transplant status: Secondary | ICD-10-CM | POA: Diagnosis not present

## 2017-11-05 DIAGNOSIS — Z23 Encounter for immunization: Secondary | ICD-10-CM | POA: Diagnosis not present

## 2017-11-05 DIAGNOSIS — D469 Myelodysplastic syndrome, unspecified: Secondary | ICD-10-CM | POA: Diagnosis not present

## 2017-11-09 ENCOUNTER — Encounter: Payer: Self-pay | Admitting: Hematology and Oncology

## 2017-11-09 ENCOUNTER — Telehealth: Payer: Self-pay

## 2017-11-09 NOTE — Telephone Encounter (Signed)
Sending Dr Alvy Bimler email from pt per mychart: Hi Dr. Idelia Salm Dr. Nila Nephew and Levada Dy Minor this Monday. Levada Dy, NP, talked with Ou[place] Sills, Dr. Kendell Bane NP (one of them), suggested moving my BMX up from November to August 7 with labs and a Gasparetto appointment August 24 to discuss results and maintenance.  Knew this was probably coming, but.....    I have seen some numbers have rising , and I now have an M Spike 1 and M Spike 2--both low.    They want me to see you and get labs, what you ran in March and add Vitamin D if possible, between now and August 7.  Guess last full week of June fits that range--I am available anytime except Monday, June 24--morning if possible.     Thanks    Jean Davidson

## 2017-11-12 ENCOUNTER — Other Ambulatory Visit: Payer: Self-pay | Admitting: Hematology and Oncology

## 2017-11-12 DIAGNOSIS — E559 Vitamin D deficiency, unspecified: Secondary | ICD-10-CM

## 2017-11-12 DIAGNOSIS — C9002 Multiple myeloma in relapse: Secondary | ICD-10-CM

## 2017-11-12 NOTE — Telephone Encounter (Signed)
I will send scheduling msg 

## 2017-11-13 ENCOUNTER — Telehealth: Payer: Self-pay | Admitting: Hematology and Oncology

## 2017-11-13 NOTE — Telephone Encounter (Signed)
Mailed patient calendar of upcoming June appointments per 5/20 sch message  °

## 2017-11-14 ENCOUNTER — Telehealth: Payer: Self-pay | Admitting: Hematology and Oncology

## 2017-11-14 NOTE — Telephone Encounter (Signed)
Patient called to reschedule  °

## 2017-11-27 ENCOUNTER — Encounter: Payer: Self-pay | Admitting: Hematology and Oncology

## 2017-12-05 ENCOUNTER — Encounter: Payer: Self-pay | Admitting: Hematology and Oncology

## 2017-12-06 ENCOUNTER — Other Ambulatory Visit: Payer: Self-pay | Admitting: Hematology and Oncology

## 2017-12-06 ENCOUNTER — Telehealth: Payer: Self-pay

## 2017-12-06 DIAGNOSIS — M898X1 Other specified disorders of bone, shoulder: Secondary | ICD-10-CM

## 2017-12-06 DIAGNOSIS — C9002 Multiple myeloma in relapse: Secondary | ICD-10-CM

## 2017-12-06 NOTE — Telephone Encounter (Signed)
Called radiology scheduling and scheduled skeletal survey xray. Appt is 6/14 at 1100, she needs to arrive at 1045 in radiology.   Called patient and given above message. She verbalized understanding.

## 2017-12-07 ENCOUNTER — Encounter: Payer: Self-pay | Admitting: Hematology and Oncology

## 2017-12-07 ENCOUNTER — Ambulatory Visit (HOSPITAL_COMMUNITY)
Admission: RE | Admit: 2017-12-07 | Discharge: 2017-12-07 | Disposition: A | Discharge status: Home / Self Care | Payer: Medicare HMO | Source: Ambulatory Visit | Attending: Hematology and Oncology | Admitting: Hematology and Oncology

## 2017-12-07 DIAGNOSIS — M898X1 Other specified disorders of bone, shoulder: Secondary | ICD-10-CM | POA: Diagnosis not present

## 2017-12-07 DIAGNOSIS — C9002 Multiple myeloma in relapse: Secondary | ICD-10-CM | POA: Diagnosis not present

## 2017-12-07 DIAGNOSIS — C9000 Multiple myeloma not having achieved remission: Secondary | ICD-10-CM | POA: Diagnosis not present

## 2017-12-10 ENCOUNTER — Other Ambulatory Visit: Payer: Self-pay | Admitting: Hematology and Oncology

## 2017-12-10 ENCOUNTER — Encounter: Payer: Self-pay | Admitting: Hematology and Oncology

## 2017-12-10 MED ORDER — MIRTAZAPINE 30 MG PO TABS: 30.0000 mg | ORAL_TABLET | Freq: Every day | ORAL | 0 refills | Status: DC

## 2017-12-17 ENCOUNTER — Other Ambulatory Visit: Payer: Medicare HMO

## 2017-12-17 ENCOUNTER — Ambulatory Visit: Payer: Medicare HMO | Admitting: Hematology and Oncology

## 2017-12-17 DIAGNOSIS — H43813 Vitreous degeneration, bilateral: Secondary | ICD-10-CM | POA: Diagnosis not present

## 2017-12-17 DIAGNOSIS — H353132 Nonexudative age-related macular degeneration, bilateral, intermediate dry stage: Secondary | ICD-10-CM | POA: Diagnosis not present

## 2017-12-17 DIAGNOSIS — H25813 Combined forms of age-related cataract, bilateral: Secondary | ICD-10-CM | POA: Diagnosis not present

## 2017-12-19 ENCOUNTER — Other Ambulatory Visit: Payer: Self-pay

## 2017-12-19 MED ORDER — CARVEDILOL 3.125 MG PO TABS: 3.1250 mg | ORAL_TABLET | Freq: Two times a day (BID) | ORAL | 0 refills | Status: DC

## 2017-12-19 NOTE — Telephone Encounter (Signed)
*  STAT* If patient is at the pharmacy, call can be transferred to refill team.   1. Which medications need to be refilled? (please list name of each medication and dose if known) Carvedilol  2. Which pharmacy/location (including street and city if local pharmacy) is medication to be sent to? [facility]  3. Do they need a 30 day or 90 day supply? [place]

## 2017-12-20 ENCOUNTER — Inpatient Hospital Stay: Payer: Medicare HMO | Admitting: Hematology and Oncology

## 2017-12-20 ENCOUNTER — Inpatient Hospital Stay: Payer: Medicare HMO | Attending: Hematology and Oncology

## 2017-12-20 ENCOUNTER — Encounter: Payer: Self-pay | Admitting: Hematology and Oncology

## 2017-12-20 DIAGNOSIS — Z79899 Other long term (current) drug therapy: Secondary | ICD-10-CM | POA: Diagnosis not present

## 2017-12-20 DIAGNOSIS — D469 Myelodysplastic syndrome, unspecified: Secondary | ICD-10-CM | POA: Diagnosis not present

## 2017-12-20 DIAGNOSIS — M8580 Other specified disorders of bone density and structure, unspecified site: Secondary | ICD-10-CM | POA: Insufficient documentation

## 2017-12-20 DIAGNOSIS — Z9221 Personal history of antineoplastic chemotherapy: Secondary | ICD-10-CM | POA: Insufficient documentation

## 2017-12-20 DIAGNOSIS — M25512 Pain in left shoulder: Secondary | ICD-10-CM | POA: Diagnosis not present

## 2017-12-20 DIAGNOSIS — E559 Vitamin D deficiency, unspecified: Secondary | ICD-10-CM

## 2017-12-20 DIAGNOSIS — C9002 Multiple myeloma in relapse: Secondary | ICD-10-CM | POA: Insufficient documentation

## 2017-12-20 DIAGNOSIS — Z9481 Bone marrow transplant status: Secondary | ICD-10-CM | POA: Insufficient documentation

## 2017-12-20 LAB — COMPREHENSIVE METABOLIC PANEL WITH GFR
ALT: 18 U/L (ref 0–44)
AST: 19 U/L (ref 15–41)
Albumin: 3.9 g/dL (ref 3.5–5.0)
Alkaline Phosphatase: 103 U/L (ref 38–126)
Anion gap: 8 (ref 5–15)
BUN: 12 mg/dL (ref 8–23)
CO2: 27 mmol/L (ref 22–32)
Calcium: 9.5 mg/dL (ref 8.9–10.3)
Chloride: 105 mmol/L (ref 98–111)
Creatinine, Ser: 0.75 mg/dL (ref 0.44–1.00)
GFR calc Af Amer: >60 mL/min
GFR calc non Af Amer: >60 mL/min
Glucose, Bld: 90 mg/dL (ref 70–99)
Potassium: 3.8 mmol/L (ref 3.5–5.1)
Sodium: 140 mmol/L (ref 135–145)
Total Bilirubin: 0.3 mg/dL (ref 0.3–1.2)
Total Protein: 7.1 g/dL (ref 6.5–8.1)

## 2017-12-20 LAB — CBC WITH DIFFERENTIAL/PLATELET
Basophils Absolute: 0 K/uL (ref 0.0–0.1)
Basophils Relative: 1 %
Eosinophils Absolute: 0.1 K/uL (ref 0.0–0.5)
Eosinophils Relative: 1 %
HCT: 43.4 % (ref 34.8–46.6)
Hemoglobin: 14.5 g/dL (ref 11.6–15.9)
Lymphocytes Relative: 45 %
Lymphs Abs: 4.4 K/uL — ABNORMAL HIGH (ref 0.9–3.3)
MCH: 34.3 pg — ABNORMAL HIGH (ref 25.1–34.0)
MCHC: 33.5 g/dL (ref 31.5–36.0)
MCV: 102.4 fL — ABNORMAL HIGH (ref 79.5–101.0)
Monocytes Absolute: 0.8 K/uL (ref 0.1–0.9)
Monocytes Relative: 8 %
Neutro Abs: 4.4 K/uL (ref 1.5–6.5)
Neutrophils Relative %: 45 %
Platelets: 155 K/uL (ref 145–400)
RBC: 4.24 MIL/uL (ref 3.70–5.45)
RDW: 12.2 % (ref 11.2–14.5)
WBC: 9.8 K/uL (ref 3.9–10.3)

## 2017-12-20 LAB — MAGNESIUM: Magnesium: 2 mg/dL (ref 1.7–2.4)

## 2017-12-20 LAB — PHOSPHORUS: Phosphorus: 3.2 mg/dL (ref 2.5–4.6)

## 2017-12-20 LAB — LACTATE DEHYDROGENASE: LDH: 231 U/L — ABNORMAL HIGH (ref 98–192)

## 2017-12-20 MED ORDER — NYSTATIN 100000 UNIT/GM EX POWD: Freq: Four times a day (QID) | CUTANEOUS | 11 refills | Status: DC

## 2017-12-20 NOTE — Assessment & Plan Note (Signed)
She has no recent infection She is fully engrafted with no signs of graft-versus-host disease She has appointment to return back to [facility] and has received appropriate vaccination I would defer to them for further follow-up

## 2017-12-20 NOTE — Assessment & Plan Note (Addendum)
Myeloma panel is still pending.   Overall, she is not symptomatic Her last myeloma panel detected biclonal M spike Overall, she has no signs of organ damage. Recent bone survey showed no evidence of disease I recommend close observation only I recommend she takes vitamin D and calcium supplement We will call her with test results. I will defer final decision about treatment to her oncologist at [facility]

## 2017-12-20 NOTE — Progress Notes (Signed)
[place] OFFICE PROGRESS NOTE  Patient Care Team: [name], [name], MD as PCP - General (Internal Medicine) [name], MD as Referring Physician (Hematology and Oncology) [name], MD as Consulting Physician (Internal Medicine) [name], [name], MD as Consulting Physician (Infectious Diseases) [name], MD as Consulting Physician (Hematology and Oncology) [name], NP as Nurse Practitioner (Hematology and Oncology)  ASSESSMENT & PLAN:  Multiple myeloma [facility]) Myeloma panel is still pending.   Overall, she is not symptomatic Her last myeloma panel detected biclonal M spike Overall, she has no signs of organ damage. Recent bone survey showed no evidence of disease I recommend close observation only I recommend she takes vitamin D and calcium supplement We will call her with test results. 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   INTERVAL HISTORY: Please see below for problem oriented charting. She returns for further follow-up She complained of intermittent left shoulder pain that comes and goes She denies recent infection requiring antibiotics She is still using nystatin powder as needed for occasional rash under her bra line She is active, exercise on a regular basis and have lost a bit of weight since the last time I saw her  SUMMARY OF ONCOLOGIC HISTORY: Oncology  History   Multiple myeloma, Ig A Lambda, M spike 3.54 grams, Calcium 9.2, Creatinine 0.8, Beta 2 microglobulin 4.52, IgA 4840 mg/dL, lambda light chain 75.4, albumin 3.6, hemoglobin 9.7, platelet 115    Primary site: Multiple Myeloma   Staging method: AJCC 6th Edition   Clinical: Stage IIA signed by [name], MD on 11/07/2013  2:46 PM   Summary: Stage IIA        Multiple myeloma ([place])   10/31/2013 Bone Marrow Biopsy    Bone marrow biopsy confirmed multiple myeloma with 40% bone marrow involvement. Skeletal survey showed minimal lesions in her score with generalized demineralization      11/10/2013 - 02/13/2014 Chemotherapy    The patient is started on induction chemotherapy with weekly dexamethasone 40 mg by mouth as well as Velcade subcutaneous injection on days 1, 4, 8 and 11. On 11/21/2013, she was started on monthly Zometa.      12/23/2013 Adverse Reaction    The dose of Velcade was reduced due to thrombocytopenia.      01/28/2014 - 04/07/2014 Chemotherapy    Revlimid is added. Treatment was discontinued due to lack of response.      02/24/2014 - 04/07/2014 Chemotherapy    Due to worsening peripheral neuropathy, Velcade injection is changed to once a week. Revlimid was given 21 days on, 7 days off.      04/07/2014 - 04/10/2014 Chemotherapy    Revlimid was discontinued due to lack of response. Chemotherapy was changed back to Velcade injection twice a week, 2 weeks on 1 week off. Her treatment was switched to to minimum response      04/20/2014 - 06/02/2014 Chemotherapy    chemotherapy is switched to Carfilzomib, Cytoxan and  dexamethasone.      04/22/2014 Procedure    she has placement of port for chemotherapy.      06/01/2014 Tumor Marker    Bloodwork show that she has greater than partial response      06/23/2014 Bone Marrow Biopsy    Bone marrow biopsy show 5-10% residual plasma cells, normal cytogenetics and FISH      07/07/2014 Procedure    She had stem cell  collection      07/22/2014 - 07/22/2014 Chemotherapy    She had high-dose chemotherapy with melphalan      07/23/2014 Bone Marrow Transplant    She had bone marrow transplant in autologous fashion at [facility]      10/20/2014 - 03/24/2015 Chemotherapy     she received chemotherapy with Kyprolis, Revlimid and dexamethasone      10/22/2014 Procedure    She has port placement      01/19/2015 Tumor Marker    IgA lambda M spike at 0.4 g       01/20/2015 Miscellaneous    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 No cource was found and fever resolved with IV vancomycin and meropenem      09/13/2015 Bone Marrow Biopsy    Bone marrow biopsy showed no increased blasts, 3-4 % plasma cells      03/02/2016 Bone Marrow Biopsy    Bone marrow biopsy at [facility] showed normocellular (30%) bone marrow with trilineage hematopoiesis. No significant increase in blasts. No significant increase in plasma cells.      05/12/2016 Imaging    DEXA scan at [place] showed osteopenia      10/24/2016 Imaging    Skeletal survey at [facility], no new lesions      12/07/2017 Imaging    No focal abnormality noted to suggest myeloma. Exam is stable from prior exam.       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[place])   04/06/2015 Bone Marrow Biopsy    Accession: [id number] BM biopsy showed RAEB-1      04/06/2015 Tumor Marker    Cytogenetics and FISH for MDS are within normal limits      10/06/2015 - 10/10/2015 Chemotherapy    She received  conditioning chemotherapy with busulfan and melphalan      10/12/2015 Bone Marrow Transplant    She received allogenic stem cell transplant      10/19/2015 Adverse Reaction    She developed posttransplant complication with mucositis, viral infection with rhinovirus, neutropenic fever, bilateral pleural effusion and moderate pericardial effusion and CMV reactivation.      10/31/2015 Miscellaneous    She has engrafted       REVIEW OF SYSTEMS:   Constitutional: Denies fevers, chills or abnormal weight loss Eyes: Denies blurriness of vision Ears, nose, mouth, throat, and face: Denies mucositis or sore throat Respiratory: Denies cough, dyspnea or wheezes Cardiovascular: Denies palpitation, chest discomfort or lower extremity swelling Gastrointestinal:  Denies nausea, heartburn or change in bowel habits  Lymphatics: Denies new lymphadenopathy or easy bruising Neurological:Denies numbness, tingling or new weaknesses Behavioral/Psych: Mood is stable, no new changes  All other systems were reviewed with the patient and are negative.  I have reviewed the past medical history, past surgical history, social history and family history with the patient and they are unchanged from previous note.  ALLERGIES:  has No Known Allergies.  MEDICATIONS:  Current Outpatient Medications  Medication Sig Dispense Refill  . carvedilol (COREG) 3.125 MG tablet Take 1 tablet (3.125 mg total) by mouth 2 (two) times daily with a meal. 180 tablet 0  . Cholecalciferol (VITAMIN D-1000 MAX ST) 1000 units tablet Take 1,000 Units by mouth daily.    Marland Kitchen docusate sodium (COLACE) 100 MG capsule Take by mouth daily as needed.     . loperamide (IMODIUM) 2 MG capsule Take by mouth as needed for diarrhea or loose stools. As needed only    . Multiple Vitamin (MULTIVITAMIN WITH MINERALS) TABS tablet Take 1 tablet by mouth daily.    Marland Kitchen nystatin (MYCOSTATIN/NYSTOP) powder Apply topically 4 (four) times daily. 60 g 11  .  pantoprazole (PROTONIX) 40 MG tablet Take 1 tablet (40 mg total) by mouth daily before breakfast. 90 tablet 3   No current facility-administered medications for this visit.     PHYSICAL EXAMINATION: ECOG PERFORMANCE STATUS: 0 - Asymptomatic  Vitals:   12/20/17 1224  BP: (!) 141/78  Pulse: 82  Resp: 18  Temp: 98.6 F (37 C)  SpO2: 100%   Filed Weights   12/20/17 1224  Weight: 166 lb 5 oz (75.4 kg)    GENERAL:alert, no distress and comfortable SKIN: skin color, texture, turgor are normal, no rashes or significant lesions EYES: normal, Conjunctiva are pink and non-injected, sclera clear OROPHARYNX:no exudate, no erythema and lips, buccal mucosa, and tongue normal  NECK: supple, thyroid normal size, non-tender, without nodularity LYMPH:  no palpable lymphadenopathy in the cervical, axillary or inguinal LUNGS: clear to auscultation and percussion with normal breathing effort HEART: regular rate & rhythm and no murmurs and no lower extremity edema ABDOMEN:abdomen soft, non-tender and normal bowel sounds Musculoskeletal:no cyanosis of digits and no clubbing  NEURO: alert & oriented x 3 with fluent speech, no focal motor/sensory deficits  LABORATORY DATA:  I have reviewed the data as listed    Component Value Date/Time   NA 140 12/20/2017 1144   NA 142 01/04/2017 0902   K 3.8 12/20/2017 1144   K 3.9 01/04/2017 0902   CL 105 12/20/2017 1144   CO2 27 12/20/2017 1144   CO2 28 01/04/2017 0902   GLUCOSE 90 12/20/2017 1144   GLUCOSE 92 01/04/2017 0902   BUN 12 12/20/2017 1144   BUN 9.5 01/04/2017 0902   CREATININE 0.75 12/20/2017 1144   CREATININE 0.7 01/04/2017 0902   CALCIUM 9.5 12/20/2017 1144   CALCIUM 9.0 01/04/2017 0902   PROT 7.1 12/20/2017 1144   PROT 6.0 (L) 01/04/2017 0902   PROT 5.8 (L) 01/04/2017 0902   ALBUMIN 3.9 12/20/2017 1144   ALBUMIN 3.4 (L) 01/04/2017 0902   AST 19 12/20/2017 1144   AST 21 01/04/2017 0902   ALT 18 12/20/2017 1144   ALT 16 01/04/2017  0902   ALKPHOS 103 12/20/2017 1144   ALKPHOS 101 01/04/2017 0902   BILITOT 0.3 12/20/2017 1144   BILITOT 0.56 01/04/2017 0902   GFRNONAA >60 12/20/2017 1144   GFRAA >60 12/20/2017 1144    No results found for: SPEP, UPEP  Lab Results  Component Value  Date   WBC 9.8 12/20/2017   NEUTROABS 4.4 12/20/2017   HGB 14.5 12/20/2017   HCT 43.4 12/20/2017   MCV 102.4 (H) 12/20/2017   PLT 155 12/20/2017      Chemistry      Component Value Date/Time   NA 140 12/20/2017 1144   NA 142 01/04/2017 0902   K 3.8 12/20/2017 1144   K 3.9 01/04/2017 0902   CL 105 12/20/2017 1144   CO2 27 12/20/2017 1144   CO2 28 01/04/2017 0902   BUN 12 12/20/2017 1144   BUN 9.5 01/04/2017 0902   CREATININE 0.75 12/20/2017 1144   CREATININE 0.7 01/04/2017 0902      Component Value Date/Time   CALCIUM 9.5 12/20/2017 1144   CALCIUM 9.0 01/04/2017 0902   ALKPHOS 103 12/20/2017 1144   ALKPHOS 101 01/04/2017 0902   AST 19 12/20/2017 1144   AST 21 01/04/2017 0902   ALT 18 12/20/2017 1144   ALT 16 01/04/2017 0902   BILITOT 0.3 12/20/2017 1144   BILITOT 0.56 01/04/2017 0902       RADIOGRAPHIC STUDIES:I reviewed the scan with the patient I have personally reviewed the radiological images as listed and agreed with the findings in the report. Dg Bone Survey Met  Result Date: 12/07/2017 CLINICAL DATA:  Multiple myeloma. EXAM: METASTATIC BONE SURVEY COMPARISON:  Bone survey 10/29/2013. FINDINGS: Imaging of the axial and appendicular skeleton again obtained. Tiny lucency previously noted in the parietal area of the calvarium is stable most likely a tiny vascular [place]. Diffuse osteopenia and degenerative change. Postsurgical changes right shoulder again noted. No focal abnormality noted to suggest myeloma. IMPRESSION: No focal abnormality noted to suggest myeloma. Exam is stable from prior exam. Electronically Signed   By: Garza-Salinas II   On: 12/07/2017 14:30    All questions were answered. The patient knows  to call the clinic with any problems, questions or concerns. No barriers to learning was detected.  I spent 15 minutes counseling the patient face to face. The total time spent in the appointment was 20 minutes and more than 50% was on counseling and review of test results  [name], MD 12/20/2017 12:46 PM

## 2017-12-20 NOTE — Assessment & Plan Note (Signed)
She is fully engrafted with normal CBC s/p allogeneic BMT She will continue close monitoring and follow-up at [facility]

## 2017-12-21 LAB — VITAMIN D 25 HYDROXY (VIT D DEFICIENCY, FRACTURES): Vit D, 25-Hydroxy: 40.2 ng/mL (ref 30.0–100.0)

## 2017-12-21 LAB — KAPPA/LAMBDA LIGHT CHAINS
Kappa free light chain: 15.5 mg/L (ref 3.3–19.4)
Kappa, lambda light chain ratio: 0.27 (ref 0.26–1.65)
Lambda free light chains: 57 mg/L — ABNORMAL HIGH (ref 5.7–26.3)

## 2017-12-21 LAB — BETA 2 MICROGLOBULIN, SERUM: Beta-2 Microglobulin: 2.6 mg/L — ABNORMAL HIGH (ref 0.6–2.4)

## 2017-12-23 LAB — MULTIPLE MYELOMA PANEL, SERUM
Albumin SerPl Elph-Mcnc: 3.8 g/dL (ref 2.9–4.4)
Albumin/Glob SerPl: 1.3 (ref 0.7–1.7)
Alpha 1: 0.2 g/dL (ref 0.0–0.4)
Alpha2 Glob SerPl Elph-Mcnc: 0.8 g/dL (ref 0.4–1.0)
B-Globulin SerPl Elph-Mcnc: 1.1 g/dL (ref 0.7–1.3)
Gamma Glob SerPl Elph-Mcnc: 0.9 g/dL (ref 0.4–1.8)
Globulin, Total: 3 g/dL (ref 2.2–3.9)
IgA: 303 mg/dL (ref 64–422)
IgG (Immunoglobin G), Serum: 878 mg/dL (ref 700–1600)
IgM (Immunoglobulin M), Srm: 185 mg/dL (ref 26–217)
M Protein SerPl Elph-Mcnc: 0.2 g/dL — ABNORMAL HIGH
Total Protein ELP: 6.8 g/dL (ref 6.0–8.5)

## 2017-12-24 ENCOUNTER — Telehealth: Payer: Self-pay

## 2017-12-24 NOTE — Telephone Encounter (Signed)
-----   Message from [name], MD sent at 12/24/2017 11:06 AM EDT ----- Regarding: myeloma panel Pls let her know M spike is stable but light chains a bit up Ask if she wants copies of results we can mail to her

## 2017-12-24 NOTE — Telephone Encounter (Signed)
Called and given below message. She verbalized understanding. She is on mychart and will look at labs online.

## 2017-12-24 NOTE — Telephone Encounter (Signed)
She called and left a message requesting lab results be mailed to her. Labs results sent in mail.

## 2017-12-28 ENCOUNTER — Encounter: Payer: Self-pay | Admitting: Hematology and Oncology

## 2017-12-28 ENCOUNTER — Telehealth: Payer: Self-pay | Admitting: *Deleted

## 2017-12-28 NOTE — Telephone Encounter (Signed)
-----   Message from [name], MD sent at 12/28/2017 11:05 AM EDT ----- Regarding: results Not sure if [facility] mailed her the results or not Also, she requests results faxed to PCP (Dr. Osborne Casco). Her appt is next Tuesday

## 2017-12-28 NOTE — Telephone Encounter (Signed)
Labs faxed to Dr Osborne Casco

## 2017-12-31 ENCOUNTER — Telehealth: Payer: Self-pay | Admitting: *Deleted

## 2017-12-31 ENCOUNTER — Encounter: Payer: Self-pay | Admitting: Hematology and Oncology

## 2017-12-31 NOTE — Telephone Encounter (Signed)
Labs faxed to Naoma Diener, NP at [facility] per patient request

## 2018-01-01 ENCOUNTER — Encounter: Payer: Self-pay | Admitting: Hematology and Oncology

## 2018-01-01 DIAGNOSIS — I10 Essential (primary) hypertension: Secondary | ICD-10-CM | POA: Diagnosis not present

## 2018-01-08 DIAGNOSIS — Z1212 Encounter for screening for malignant neoplasm of rectum: Secondary | ICD-10-CM | POA: Diagnosis not present

## 2018-01-08 DIAGNOSIS — Z9481 Bone marrow transplant status: Secondary | ICD-10-CM | POA: Diagnosis not present

## 2018-01-08 DIAGNOSIS — Z0000 Encounter for general adult medical examination without abnormal findings: Secondary | ICD-10-CM | POA: Diagnosis not present

## 2018-01-08 DIAGNOSIS — G4709 Other insomnia: Secondary | ICD-10-CM | POA: Diagnosis not present

## 2018-01-08 DIAGNOSIS — N816 Rectocele: Secondary | ICD-10-CM | POA: Diagnosis not present

## 2018-01-08 DIAGNOSIS — I8390 Asymptomatic varicose veins of unspecified lower extremity: Secondary | ICD-10-CM | POA: Diagnosis not present

## 2018-01-08 DIAGNOSIS — I6529 Occlusion and stenosis of unspecified carotid artery: Secondary | ICD-10-CM | POA: Diagnosis not present

## 2018-01-08 DIAGNOSIS — N390 Urinary tract infection, site not specified: Secondary | ICD-10-CM | POA: Diagnosis not present

## 2018-01-08 DIAGNOSIS — I10 Essential (primary) hypertension: Secondary | ICD-10-CM | POA: Diagnosis not present

## 2018-01-08 DIAGNOSIS — C9002 Multiple myeloma in relapse: Secondary | ICD-10-CM | POA: Diagnosis not present

## 2018-01-08 DIAGNOSIS — K219 Gastro-esophageal reflux disease without esophagitis: Secondary | ICD-10-CM | POA: Diagnosis not present

## 2018-01-08 DIAGNOSIS — H3530 Unspecified macular degeneration: Secondary | ICD-10-CM | POA: Diagnosis not present

## 2018-01-26 ENCOUNTER — Other Ambulatory Visit: Payer: Self-pay | Admitting: Internal Medicine

## 2018-01-30 DIAGNOSIS — Z23 Encounter for immunization: Secondary | ICD-10-CM | POA: Diagnosis not present

## 2018-01-30 DIAGNOSIS — C9000 Multiple myeloma not having achieved remission: Secondary | ICD-10-CM | POA: Diagnosis not present

## 2018-01-30 DIAGNOSIS — D469 Myelodysplastic syndrome, unspecified: Secondary | ICD-10-CM | POA: Diagnosis not present

## 2018-01-30 DIAGNOSIS — Z9481 Bone marrow transplant status: Secondary | ICD-10-CM | POA: Diagnosis not present

## 2018-01-30 DIAGNOSIS — C9001 Multiple myeloma in remission: Secondary | ICD-10-CM | POA: Diagnosis not present

## 2018-01-30 DIAGNOSIS — D4989 Neoplasm of unspecified behavior of other specified sites: Secondary | ICD-10-CM | POA: Diagnosis not present

## 2018-02-13 ENCOUNTER — Encounter: Payer: Self-pay | Admitting: Hematology and Oncology

## 2018-02-13 DIAGNOSIS — C9000 Multiple myeloma not having achieved remission: Secondary | ICD-10-CM | POA: Diagnosis not present

## 2018-02-14 ENCOUNTER — Other Ambulatory Visit: Payer: Self-pay | Admitting: Hematology and Oncology

## 2018-02-14 DIAGNOSIS — C9002 Multiple myeloma in relapse: Secondary | ICD-10-CM

## 2018-02-14 NOTE — Progress Notes (Signed)
START ON PATHWAY REGIMEN - Multiple Myeloma and Other Plasma Cell Dyscrasias     A cycle is every 28 days:     Pomalidomide      Dexamethasone      Daratumumab      Dexamethasone      Dexamethasone      Daratumumab      Dexamethasone      Dexamethasone      Daratumumab   **Always confirm dose/schedule in your pharmacy ordering system**  Patient Characteristics: Relapsed / Refractory, All Lines of Therapy R-ISS Staging: Unknown Disease Classification: Relapsed Line of Therapy: Third Line Intent of Therapy: Non-Curative / Palliative Intent, Discussed with Patient 

## 2018-02-18 ENCOUNTER — Telehealth: Payer: Self-pay | Admitting: Hematology and Oncology

## 2018-02-18 NOTE — Telephone Encounter (Signed)
Scheduled appt per 8/22 sch message - pt ios aware of appt date and time.

## 2018-02-20 ENCOUNTER — Telehealth: Payer: Self-pay

## 2018-02-20 ENCOUNTER — Encounter: Payer: Self-pay | Admitting: Hematology and Oncology

## 2018-02-20 ENCOUNTER — Telehealth: Payer: Self-pay | Admitting: Hematology and Oncology

## 2018-02-20 ENCOUNTER — Inpatient Hospital Stay: Payer: Medicare HMO | Attending: Hematology and Oncology | Admitting: Hematology and Oncology

## 2018-02-20 VITALS — BP 145/87 | HR 85 | Temp 98.4°F | Resp 16 | Ht 63.0 in | Wt 165.9 lb

## 2018-02-20 DIAGNOSIS — M8580 Other specified disorders of bone density and structure, unspecified site: Secondary | ICD-10-CM | POA: Diagnosis not present

## 2018-02-20 DIAGNOSIS — Z7189 Other specified counseling: Secondary | ICD-10-CM

## 2018-02-20 DIAGNOSIS — Z9225 Personal history of immunosupression therapy: Secondary | ICD-10-CM | POA: Diagnosis not present

## 2018-02-20 DIAGNOSIS — D72819 Decreased white blood cell count, unspecified: Secondary | ICD-10-CM

## 2018-02-20 DIAGNOSIS — D649 Anemia, unspecified: Secondary | ICD-10-CM | POA: Insufficient documentation

## 2018-02-20 DIAGNOSIS — Z9221 Personal history of antineoplastic chemotherapy: Secondary | ICD-10-CM | POA: Diagnosis not present

## 2018-02-20 DIAGNOSIS — Z9484 Stem cells transplant status: Secondary | ICD-10-CM | POA: Diagnosis not present

## 2018-02-20 DIAGNOSIS — C9002 Multiple myeloma in relapse: Secondary | ICD-10-CM | POA: Insufficient documentation

## 2018-02-20 DIAGNOSIS — Z79899 Other long term (current) drug therapy: Secondary | ICD-10-CM | POA: Diagnosis not present

## 2018-02-20 DIAGNOSIS — D469 Myelodysplastic syndrome, unspecified: Secondary | ICD-10-CM

## 2018-02-20 MED ORDER — LIDOCAINE-PRILOCAINE 2.5-2.5 % EX CREA: TOPICAL_CREAM | CUTANEOUS | 3 refills | Status: DC

## 2018-02-20 MED ORDER — POMALIDOMIDE 2 MG PO CAPS: 2.0000 mg | ORAL_CAPSULE | Freq: Every day | ORAL | 9 refills | Status: DC

## 2018-02-20 MED ORDER — ACYCLOVIR 400 MG PO TABS: 400.0000 mg | ORAL_TABLET | Freq: Two times a day (BID) | ORAL | 11 refills | Status: DC

## 2018-02-20 MED ORDER — PROCHLORPERAZINE MALEATE 10 MG PO TABS: 10.0000 mg | ORAL_TABLET | Freq: Four times a day (QID) | ORAL | 1 refills | Status: DC | PRN

## 2018-02-20 MED ORDER — ONDANSETRON HCL 8 MG PO TABS: 8.0000 mg | ORAL_TABLET | Freq: Three times a day (TID) | ORAL | 1 refills | Status: DC | PRN

## 2018-02-20 MED ORDER — DEXAMETHASONE 4 MG PO TABS: ORAL_TABLET | ORAL | 9 refills | Status: DC

## 2018-02-20 NOTE — Progress Notes (Signed)
[place] OFFICE PROGRESS NOTE  Patient Care Team: [name], [name], MD as PCP - General (Internal Medicine) [name], MD as Referring Physician (Hematology and Oncology) [name], MD as Consulting Physician (Internal Medicine) [name], [name], MD as Consulting Physician (Infectious Diseases) [name], MD as Consulting Physician (Hematology and Oncology) [name], NP as Nurse Practitioner (Hematology and Oncology)  ASSESSMENT & PLAN:  Multiple myeloma [facility]) Based on discussion with her physician at [facility], we plan to resume chemotherapy for recurrent multiple myeloma.  This is based on recent FDA approval and publications as follows  We discussed the role of treatment is strictly palliative  Daratumumab plus pomalidomide and dexamethasone in relapsed and/or refractory multiple myeloma Jacklynn Barnacle, [name], [name], [name], [name], [place] Ifthikharuddin, [name]. [name], [name], [name], [name], [name], [name], [name] and [name]  Blood 2017 :blood-[id number]; doi: [email or web address]   Daratumumab plus pomalidomide/dexamethasone (pom-dex) was evaluated in patients with relapsed/refractory multiple myeloma with ?2 prior lines of therapy, and who were refractory to their last treatment.   Patients received daratumumab 16 mg/kg at the recommended dosing schedule, pomalidomide 4 mg daily for 21 days of each 28-day cycle, and dexamethasone 40 mg weekly. Safety was the primary endpoint.   Overall response rate (ORR) and minimal residual disease (MRD) by next-generation sequencing were secondary endpoints. Patients (N = 103) received a median (range) of 4 (1-13) prior therapies; 76% received ?3 prior therapies. The safety profile of daratumumab  plus pom-dex was similar to that of pom-dex alone, with the exception of daratumumab-specific infusion-related reactions (50%) and a higher incidence of neutropenia, although without an increase in infection rate. Common grade ?3 adverse events were neutropenia (78%), anemia (28%), and leukopenia (24%). ORR was 60% and was generally consistent across subgroups (58% in double-refractory patients).   Among patients with a complete response or better, 29% were MRD-negative at a threshold of 10-5. Among the 62 responders, median duration of response was not estimable (NE; 95% CI, 13.6-NE). At a median follow-up of 13.1 months, the median progression-free survival was 8.8 (95% CI, 4.6-15.4) months and median overall survival was 17.5 (95% CI, 13.3-NE) months. The estimated 28-monthsurvival rate was 66% (95% CI, 55.6-74.8). Aside from increased neutropenia, the safety profile of daratumumab plus pom-dex was consistent with that of the individual therapies. Deep, durable responses were observed in heavily treated patients  Infusion reactions are common side effects.  Some of the short term side-effects included, though not limited to, risk of fatigue, weight loss, tumor lysis syndrome, risk of allergic reactions, pancytopenia, risk of blood clots, life-threatening infections, need for transfusions of blood products, admission to hospital for various reasons, and risks of death.   The patient is aware that the response rates discussed earlier is not guaranteed.    After a long discussion, patient made an informed decision to proceed with the prescribed plan of care.  I recommend weekly dexamethasone at 20 mg on Mondays and her treatment to be on Wednesdays. 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 We discussed importance of Advanced Directives and Living will.    Orders Placed This Encounter  Procedures  . CBC with Differential (Cancer Center Only)    Standing Status:   Standing    Number of Occurrences:   20    Standing Expiration Date:   02/21/2019  . CMP (Cancer Center only)    Standing Status:   Standing    Number of Occurrences:   20    Standing Expiration Date:   02/21/2019  . Pretreatment RBC phenotype    Standing Status:   Future    Standing Expiration Date:   03/27/2019  . Kappa/lambda light chains    Standing Status:   Future    Standing Expiration Date:   03/27/2019  . Multiple Myeloma Panel (SPEP&IFE w/QIG)    Standing Status:   Future    Standing Expiration Date:   03/27/2019  . Uric acid    Standing Status:   Future    Standing Expiration Date:   03/27/2019  . Beta 2 microglobulin, serum    Standing Status:   Future    Standing Expiration Date:   03/27/2019  . Type and screen    Obtain prior to daratumumab treatment.    Standing Status:   Future    Standing Expiration Date:   02/21/2019    INTERVAL HISTORY: Please see below for problem oriented charting. She returns for further discussion about resumption of treatment for multiple myeloma I have reviewed documentation at [facility] Due to recurrence of disease, despite lack of symptoms, she is recommended to resume  chemotherapy She denies recent infection or bone pain. No recent dental issues.  SUMMARY OF ONCOLOGIC HISTORY: Oncology History   Multiple myeloma, Ig A Lambda, M spike 3.54 grams, Calcium 9.2, Creatinine 0.8, Beta 2 microglobulin 4.52, IgA 4840 mg/dL, lambda light chain 75.4, albumin 3.6, hemoglobin 9.7, platelet 115    Primary site: Multiple Myeloma   Staging method: AJCC 6th Edition   Clinical: Stage IIA signed by Ni Gorsuch, MD on 11/07/2013  2:46 PM   Summary: Stage IIA        Multiple myeloma (HCC)   10/31/2013 Bone Marrow Biopsy    Bone marrow biopsy confirmed multiple myeloma with 40% bone marrow involvement. Skeletal survey showed minimal lesions in her score with generalized demineralization    11/10/2013 - 02/13/2014 Chemotherapy    The patient is started on induction chemotherapy with weekly dexamethasone 40 mg by mouth as well as Velcade subcutaneous injection on days 1, 4, 8 and 11. On 11/21/2013, she was started on monthly Zometa.    12/23/2013 Adverse Reaction    The dose of Velcade was reduced due to thrombocytopenia.    01/28/2014 - 04/07/2014 Chemotherapy    Revlimid is added. Treatment was discontinued due to lack of response.    02/24/2014 - 04/07/2014 Chemotherapy    Due to worsening peripheral neuropathy, Velcade injection is changed to once a week. Revlimid was given 21 days on, 7 days off.    04/07/2014 -   04/10/2014 Chemotherapy    Revlimid was discontinued due to lack of response. Chemotherapy was changed back to Velcade injection twice a week, 2 weeks on 1 week off. Her treatment was switched to to minimum response    04/20/2014 - 06/02/2014 Chemotherapy    chemotherapy is switched to Carfilzomib, Cytoxan and dexamethasone.    04/22/2014 Procedure    she has placement of port for chemotherapy.    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 which showed mild pancytopenia but not diagnostic for myelodysplastic syndrome or multiple myeloma    07/22/2015 - 09/21/2015 Chemotherapy    She is receiving Daratumumab at [name] due to relapsed myeloma    08/03/2015 - 08/06/2015 Hospital Admission    She was admitted to the hospital for neutropenic fever. No cource was found and fever resolved with IV vancomycin and meropenem    09/13/2015 Bone Marrow Biopsy    Bone marrow biopsy showed no increased blasts, 3-4 % plasma cells    03/02/2016 Bone Marrow Biopsy    Bone marrow biopsy at [facility] showed normocellular (30%) bone marrow with trilineage hematopoiesis. No significant increase in blasts. No significant increase in plasma cells.    05/12/2016 Imaging    DEXA scan at [facility] showed osteopenia    10/24/2016 Imaging    Skeletal survey at [facility], no new lesions    12/07/2017 Imaging    No focal abnormality noted to suggest myeloma. Exam is stable from prior exam.    02/20/2018 -  Chemotherapy    The patient had daratumumab (DARZALEX) 1,200 mg in sodium chloride 0.9 % 940 mL (1.2 mg/mL) chemo infusion, 16 mg/kg = 1,200 mg, Intravenous, Once, 0 of 1 cycle daratumumab (DARZALEX) 1,200 mg in sodium chloride 0.9 % 440 mL (2.4  mg/mL) chemo infusion, 16 mg/kg = 1,200 mg, Intravenous, Once, 0 of 7 cycles  for chemotherapy treatment.      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[place])   04/06/2015 Bone Marrow Biopsy    Accession: [id number] BM biopsy showed RAEB-1    04/06/2015 Tumor Marker    Cytogenetics and FISH for MDS are within normal limits    10/06/2015 - 10/10/2015 Chemotherapy    She received conditioning chemotherapy with busulfan and melphalan    10/12/2015 Bone Marrow Transplant    She received allogenic stem cell transplant    10/19/2015 Adverse Reaction    She developed posttransplant complication with mucositis, viral infection with rhinovirus, neutropenic fever, bilateral pleural effusion and moderate pericardial effusion  and CMV reactivation.    10/31/2015 Miscellaneous    She has engrafted     REVIEW OF SYSTEMS:   Constitutional: Denies fevers, chills or abnormal weight loss Eyes: Denies blurriness of vision Ears, nose, mouth, throat, and face: Denies mucositis or sore throat Respiratory: Denies cough, dyspnea or wheezes Cardiovascular: Denies palpitation, chest discomfort or lower extremity swelling Gastrointestinal:  Denies nausea, heartburn or change in bowel habits Skin: Denies abnormal skin rashes Lymphatics: Denies new lymphadenopathy or easy bruising Neurological:Denies numbness, tingling or new weaknesses Behavioral/Psych: Mood is stable, no new changes  All other systems were reviewed with the patient and are negative.  I have reviewed the past medical history, past surgical history, social history and family history with the patient and they are unchanged from previous note.  ALLERGIES:  has No Known Allergies.  MEDICATIONS:  Current Outpatient Medications  Medication Sig Dispense Refill  . acyclovir (ZOVIRAX) 400 MG tablet Take 1 tablet (400 mg total) by mouth 2 (two) times daily. 60 tablet 11  . carvedilol (COREG) 3.125 MG tablet Take 1 tablet (3.125 mg  total) by mouth 2 (two) times daily with a meal. 180 tablet 0  . Cholecalciferol (VITAMIN D-1000 MAX ST) 1000 units tablet Take 1,000 Units by mouth daily.    . dexamethasone (DECADRON) 4 MG tablet Take weekly 20 mg with food 20 tablet 9  . docusate sodium (COLACE) 100 MG capsule Take by mouth daily as needed.     . lidocaine-prilocaine (EMLA) cream Apply to affected area once 30 g 3  . loperamide (IMODIUM) 2 MG capsule Take by mouth as needed for diarrhea or loose stools. As needed only    . mirtazapine (REMERON) 30 MG tablet Take 1 tablet by mouth at bedtime.    . Multiple Vitamin (MULTIVITAMIN WITH MINERALS) TABS tablet Take 1 tablet by mouth daily.    . Multiple Vitamins-Minerals (ICAPS AREDS 2) CAPS Take 1 capsule by mouth 2 (two) times daily.    . nystatin (MYCOSTATIN/NYSTOP) powder Apply topically 4 (four) times daily. (Patient taking differently: Apply 1 Bottle topically 4 (four) times daily. ) 60 g 11  . ondansetron (ZOFRAN) 8 MG tablet Take 1 tablet (8 mg total) by mouth every 8 (eight) hours as needed (Nausea or vomiting). 30 tablet 1  . pantoprazole (PROTONIX) 40 MG tablet TAKE 1 TABLET (40 MG TOTAL) BY MOUTH DAILY BEFORE BREAKFAST. 90 tablet 3  . pomalidomide (POMALYST) 2 MG capsule Take 1 capsule (2 mg total) by mouth daily. Take with water on days 1-21. Repeat every 28 days. 21 capsule 9  . prochlorperazine (COMPAZINE) 10 MG tablet Take 1 tablet (10 mg total) by mouth every 6 (six) hours as needed (Nausea or vomiting). 30 tablet 1   No current facility-administered medications for this visit.     PHYSICAL EXAMINATION: ECOG PERFORMANCE STATUS: 0 - Asymptomatic  Vitals:   02/20/18 0833  BP: (!) 145/87  Pulse: 85  Resp: 16  Temp: 98.4 F (36.9 C)  SpO2: 98%   Filed Weights   02/20/18 0833  Weight: 165 lb 14.4 oz (75.3 kg)    GENERAL:alert, no distress and comfortable SKIN: skin color, texture, turgor are normal, no rashes or significant lesions EYES: normal,  Conjunctiva are pink and non-injected, sclera clear OROPHARYNX:no exudate, no erythema and lips, buccal mucosa, and tongue normal  NECK: supple, thyroid normal size, non-tender, without nodularity LYMPH:  no palpable lymphadenopathy in the cervical, axillary or inguinal LUNGS: clear to auscultation and percussion with normal   breathing effort HEART: regular rate & rhythm and no murmurs and no lower extremity edema ABDOMEN:abdomen soft, non-tender and normal bowel sounds Musculoskeletal:no cyanosis of digits and no clubbing  NEURO: alert & oriented x 3 with fluent speech, no focal motor/sensory deficits  LABORATORY DATA:  I have reviewed the data as listed    Component Value Date/Time   NA 140 12/20/2017 1144   NA 142 01/04/2017 0902   K 3.8 12/20/2017 1144   K 3.9 01/04/2017 0902   CL 105 12/20/2017 1144   CO2 27 12/20/2017 1144   CO2 28 01/04/2017 0902   GLUCOSE 90 12/20/2017 1144   GLUCOSE 92 01/04/2017 0902   BUN 12 12/20/2017 1144   BUN 9.5 01/04/2017 0902   CREATININE 0.75 12/20/2017 1144   CREATININE 0.7 01/04/2017 0902   CALCIUM 9.5 12/20/2017 1144   CALCIUM 9.0 01/04/2017 0902   PROT 7.1 12/20/2017 1144   PROT 6.0 (L) 01/04/2017 0902   PROT 5.8 (L) 01/04/2017 0902   ALBUMIN 3.9 12/20/2017 1144   ALBUMIN 3.4 (L) 01/04/2017 0902   AST 19 12/20/2017 1144   AST 21 01/04/2017 0902   ALT 18 12/20/2017 1144   ALT 16 01/04/2017 0902   ALKPHOS 103 12/20/2017 1144   ALKPHOS 101 01/04/2017 0902   BILITOT 0.3 12/20/2017 1144   BILITOT 0.56 01/04/2017 0902   GFRNONAA >60 12/20/2017 1144   GFRAA >60 12/20/2017 1144    No results found for: SPEP, UPEP  Lab Results  Component Value Date   WBC 9.8 12/20/2017   NEUTROABS 4.4 12/20/2017   HGB 14.5 12/20/2017   HCT 43.4 12/20/2017   MCV 102.4 (H) 12/20/2017   PLT 155 12/20/2017      Chemistry      Component Value Date/Time   NA 140 12/20/2017 1144   NA 142 01/04/2017 0902   K 3.8 12/20/2017 1144   K 3.9 01/04/2017  0902   CL 105 12/20/2017 1144   CO2 27 12/20/2017 1144   CO2 28 01/04/2017 0902   BUN 12 12/20/2017 1144   BUN 9.5 01/04/2017 0902   CREATININE 0.75 12/20/2017 1144   CREATININE 0.7 01/04/2017 0902      Component Value Date/Time   CALCIUM 9.5 12/20/2017 1144   CALCIUM 9.0 01/04/2017 0902   ALKPHOS 103 12/20/2017 1144   ALKPHOS 101 01/04/2017 0902   AST 19 12/20/2017 1144   AST 21 01/04/2017 0902   ALT 18 12/20/2017 1144   ALT 16 01/04/2017 0902   BILITOT 0.3 12/20/2017 1144   BILITOT 0.56 01/04/2017 0902       All questions were answered. The patient knows to call the clinic with any problems, questions or concerns. No barriers to learning was detected.  I spent 40 minutes counseling the patient face to face. The total time spent in the appointment was 55 minutes and more than 50% was on counseling and review of test results  Ni Gorsuch, MD 02/20/2018 3:01 PM  

## 2018-02-20 NOTE — Assessment & Plan Note (Signed)
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

## 2018-02-20 NOTE — Assessment & Plan Note (Signed)
The patient is aware she has incurable disease and treatment is strictly palliative. We discussed importance of Advanced Directives and Living will. 

## 2018-02-20 NOTE — Telephone Encounter (Signed)
Gave avs and calendar had to move one day to Thursday. Waiting on 9/11

## 2018-02-20 NOTE — Assessment & Plan Note (Signed)
Based on discussion with her physician at [facility], we plan to resume chemotherapy for recurrent multiple myeloma.  This is based on recent FDA approval and publications as follows  We discussed the role of treatment is strictly palliative  Daratumumab plus pomalidomide and dexamethasone in relapsed and/or refractory multiple myeloma Jean Davidson, [name], [name], [name], [name], [name], [name]. [name], [name], [name], [name], [name], [name], [name] and [name]  Blood 2017 :blood-[id number]; doi: [email or web address]   Daratumumab plus pomalidomide/dexamethasone (pom-dex) was evaluated in patients with relapsed/refractory multiple myeloma with ?2 prior lines of therapy, and who were refractory to their last treatment.   Patients received daratumumab 16 mg/kg at the recommended dosing schedule, pomalidomide 4 mg daily for 21 days of each 28-day cycle, and dexamethasone 40 mg weekly. Safety was the primary endpoint.   Overall response rate (ORR) and minimal residual disease (MRD) by next-generation sequencing were secondary endpoints. Patients (N = 103) received a median (range) of 4 (1-13) prior therapies; 76% received ?3 prior therapies. The safety profile of daratumumab plus pom-dex was similar to that of pom-dex alone, with the exception of daratumumab-specific infusion-related reactions (50%) and a higher incidence of neutropenia, although without an increase in infection rate. Common grade ?3 adverse events were neutropenia (78%), anemia (28%), and leukopenia (24%). ORR was 60% and was generally consistent across subgroups (58% in double-refractory patients).   Among patients with a complete response or better, 29% were MRD-negative at a threshold of 10-5. Among the 62 responders, median duration of  response was not estimable (NE; 95% CI, 13.6-NE). At a median follow-up of 13.1 months, the median progression-free survival was 8.8 (95% CI, 4.6-15.4) months and median overall survival was 17.5 (95% CI, 13.3-NE) months. The estimated 67-monthsurvival rate was 66% (95% CI, 55.6-74.8). Aside from increased neutropenia, the safety profile of daratumumab plus pom-dex was consistent with that of the individual therapies. Deep, durable responses were observed in heavily treated patients  Infusion reactions are common side effects.  Some of the short term side-effects included, though not limited to, risk of fatigue, weight loss, tumor lysis syndrome, risk of allergic reactions, pancytopenia, risk of blood clots, life-threatening infections, need for transfusions of blood products, admission to hospital for various reasons, and risks of death.   The patient is aware that the response rates discussed earlier is not guaranteed.    After a long discussion, patient made an informed decision to proceed with the prescribed plan of care.  I recommend weekly dexamethasone at 20 mg on Mondays and her treatment to be on Wednesdays. I recommend reduced dose of Pomalyst at 2 mg, to be taken on days 1-21, rest 7 days She will start her daratumumab and Pomalyst on March 06, 2018 Port placement is scheduled for next week I recommend acyclovir for antimicrobial prophylaxis I recommend 325 mg aspirin for DVT prophylaxis She is reminded to take calcium with vitamin D I recommend she gets dental clearance within the next few weeks and a plan to resume Zometa in the future I will plan to see her back within a few weeks after the first day of treatment for toxicity review

## 2018-02-20 NOTE — Telephone Encounter (Signed)
Called per Dr. Alvy Bimler. Ask when she last had a dental appt. She said back in 2/19 she had a crown and cleaning prior to that. Instructed per Dr. Alvy Bimler to schedule dental cleaning in the next 2 weeks she will need dental clearance prior to Zometa.

## 2018-02-21 ENCOUNTER — Encounter: Payer: Self-pay | Admitting: Hematology and Oncology

## 2018-02-21 ENCOUNTER — Telehealth: Payer: Self-pay | Admitting: Hematology and Oncology

## 2018-02-21 ENCOUNTER — Other Ambulatory Visit: Payer: Self-pay | Admitting: Radiology

## 2018-02-21 NOTE — Telephone Encounter (Signed)
Called patient regarding 9/11 start

## 2018-02-26 ENCOUNTER — Ambulatory Visit (HOSPITAL_COMMUNITY)
Admission: RE | Admit: 2018-02-26 | Discharge: 2018-02-26 | Disposition: A | Discharge status: Home / Self Care | Payer: Medicare HMO | Source: Ambulatory Visit | Attending: Hematology and Oncology | Admitting: Hematology and Oncology

## 2018-02-26 ENCOUNTER — Telehealth: Payer: Self-pay

## 2018-02-26 ENCOUNTER — Telehealth: Payer: Self-pay | Admitting: Pharmacist

## 2018-02-26 DIAGNOSIS — C9002 Multiple myeloma in relapse: Secondary | ICD-10-CM

## 2018-02-26 MED ORDER — POMALIDOMIDE 2 MG PO CAPS: 2.0000 mg | ORAL_CAPSULE | Freq: Every day | ORAL | 0 refills | Status: DC

## 2018-02-26 NOTE — Telephone Encounter (Signed)
Oral Chemotherapy Pharmacist Encounter   I spoke with patient for overview of: Pomalyst (pomalidomide) for the treatment of relapsed multiple myeloma in conjunction with daratumumab and dexamethasone, planned duration until disease progression or unacceptable toxicity.   Patient has previously be treated with daratumumab.  Counseled patient on administration, dosing, side effects, monitoring, drug-food interactions, safe handling, storage, and disposal.  Patient will take Pomalyst 28m capsules, 1 capsule by mouth once daily, without regard to food, with a full glass of water.  Pomalyst will be given 21 days on, 7 days off, repeat every 28 days.  Patient will take dexamethasone 449mtablets, 5 tablets (2057mby mouth once weekly with breakfast on Mondays.  Daratumumab will be infused at 16 mg/kg once weekly x 8 doses, then once every 2 weeks x 8 doses, then once every 4 weeks until discontinuation.  Pomalyst and daratumumab start date: 03/06/18  Adverse effects of Pomalyst include but are not limited to: nausea, constipation, diarrhea, abdominal pain, rash, fatigue, drug fever, peripheral edema, and decreased blood counts.    Patient states she needed extensive blood count support during previous treatment with daratumumab. 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  Reviewed with patient importance of keeping a medication schedule and plan for any missed doses.  Ms. MilVanakeniced understanding and appreciation.   All questions answered. Medication reconciliation performed and medication/allergy list updated.  Patient will pick up acyclovir and dexamethasone prescriptions and will start 03/04/18. Patient counseled on importance of daily aspirin 325m3mr VTE prophylaxis and plans to start 03/04/18 as well.  Patient informed Pomalyst [facility]  [facility]age is managed by [facility]. I provided phone number to dispensing pharmacy to patient ([phone] instructed patient to reach out to pharmacy on Friday (9/6) if she had not yet heard from them to schedule first shipment of Pomalyst. Dispensing pharmacy will provide copayment information to patient when they call to set up shipping.  Patient knows to call the office with questions or concerns. Oral Oncology Clinic will continue to follow.  JessJohny DrillingarmD, BCPS, BCOP  02/26/2018   3:44 PM Oral Oncology Clinic [phone]

## 2018-02-26 NOTE — Progress Notes (Signed)
Phone call to pt. Pt states that she thought she was supposed to arrive at 10am for [facility] insertion, advised pt that she was to be here at 0800. Pt states that she never received a phone call. Advised pt that our scheduler would call her back immediately. Spoke with scheduler and asked her to call pt and reschedule. Will follow up with pt to make sure she receives pre-procedure instructions.

## 2018-02-26 NOTE — Telephone Encounter (Signed)
Oral Oncology Patient Advocate Encounter  Received notification from [employer] that prior authorization for Pomalyst is required.  PA submitted on CoverMyMeds Key [id number] Status is pending  Oral Oncology Clinic will continue to follow.  [place] Patient [place] Phone [phone] Fax [phone]

## 2018-02-26 NOTE — Telephone Encounter (Signed)
Oral Oncology Pharmacist Encounter  Received new prescription for Pomalyst (pomalidomide) for the treatment of multiple myeloma in relapse in conjunction with daratumumab and pulse dexamethasone, planned duration until disease progression or unacceptable toxicity.  Labs from scanned lab report from 01/01/18 from [facility] assessed, [place] for treatment. Noted dose reduction to 20m daily for 21 days on, 7 days off for increased tolerance  Current medication list in Epic reviewed, no significant DDIs with Pomalyst identified.  Prescription will be sent to appropriate specialty pharmacy for dispensing once insurance authorization is obtained as Pomalyst is a limited distribution medication.  Noted dexamethasone and acyclovir prescriptions have been [place] [place]  Aspirin has not yet been added to patient's medication list.  I will ensure patient has started on her supportive care medications during initial counseling call.  Oral Oncology Clinic will continue to follow for insurance authorization, copayment issues, initial counseling and start date.  JJohny Drilling PharmD, BCPS, BCOP  02/26/2018 12:09 PM Oral Oncology Clinic [phone]

## 2018-02-26 NOTE — Telephone Encounter (Signed)
Oral Oncology Patient Advocate Encounter  Prior Authorization for Pomalyst has been approved.    PA# [id number] Effective dates: 01/27/18 through 02/25/21  Oral Oncology Clinic will continue to follow.   [place] Patient [place] Phone [phone] Fax [phone]

## 2018-02-27 ENCOUNTER — Other Ambulatory Visit: Payer: Self-pay | Admitting: Radiology

## 2018-02-27 ENCOUNTER — Encounter: Payer: Self-pay | Admitting: Hematology and Oncology

## 2018-02-28 ENCOUNTER — Other Ambulatory Visit: Payer: Self-pay | Admitting: Student

## 2018-02-28 ENCOUNTER — Telehealth: Payer: Self-pay | Admitting: Pharmacist

## 2018-02-28 ENCOUNTER — Other Ambulatory Visit: Payer: Self-pay | Admitting: Radiology

## 2018-02-28 ENCOUNTER — Encounter: Payer: Self-pay | Admitting: Hematology and Oncology

## 2018-02-28 DIAGNOSIS — C9002 Multiple myeloma in relapse: Secondary | ICD-10-CM

## 2018-02-28 MED ORDER — POMALIDOMIDE 2 MG PO CAPS: 2.0000 mg | ORAL_CAPSULE | Freq: Every day | ORAL | 0 refills | Status: DC

## 2018-02-28 NOTE — Telephone Encounter (Signed)
Oral Oncology Pharmacist Encounter  Received notification that patient has been successfully enrolled into [employer] compassionate use program and able to receive Pomalyst at $0 out-of-pocket cost until 06/25/2018  Pomalyst prescription has been E scribed to [facility] by [employer] as this is the dispensing pharmacy for [place] patient assistance.  Patient is aware.  Johny Drilling, PharmD, BCPS, BCOP  02/28/2018 3:51 PM Oral Oncology Clinic [phone]

## 2018-03-01 ENCOUNTER — Encounter (HOSPITAL_COMMUNITY): Payer: Self-pay | Admitting: Interventional Radiology

## 2018-03-01 ENCOUNTER — Other Ambulatory Visit: Payer: Self-pay

## 2018-03-01 ENCOUNTER — Ambulatory Visit (HOSPITAL_COMMUNITY)
Admission: RE | Admit: 2018-03-01 | Discharge: 2018-03-01 | Disposition: A | Discharge status: Home / Self Care | Payer: Medicare HMO | Source: Ambulatory Visit | Attending: Hematology and Oncology | Admitting: Hematology and Oncology

## 2018-03-01 ENCOUNTER — Telehealth: Payer: Self-pay

## 2018-03-01 DIAGNOSIS — Z5111 Encounter for antineoplastic chemotherapy: Secondary | ICD-10-CM | POA: Diagnosis not present

## 2018-03-01 DIAGNOSIS — C9002 Multiple myeloma in relapse: Secondary | ICD-10-CM

## 2018-03-01 DIAGNOSIS — M4802 Spinal stenosis, cervical region: Secondary | ICD-10-CM | POA: Insufficient documentation

## 2018-03-01 DIAGNOSIS — I6523 Occlusion and stenosis of bilateral carotid arteries: Secondary | ICD-10-CM | POA: Diagnosis not present

## 2018-03-01 DIAGNOSIS — Z452 Encounter for adjustment and management of vascular access device: Secondary | ICD-10-CM | POA: Diagnosis not present

## 2018-03-01 DIAGNOSIS — Z87891 Personal history of nicotine dependence: Secondary | ICD-10-CM | POA: Insufficient documentation

## 2018-03-01 DIAGNOSIS — F419 Anxiety disorder, unspecified: Secondary | ICD-10-CM | POA: Diagnosis not present

## 2018-03-01 DIAGNOSIS — R69 Illness, unspecified: Secondary | ICD-10-CM | POA: Diagnosis not present

## 2018-03-01 DIAGNOSIS — K219 Gastro-esophageal reflux disease without esophagitis: Secondary | ICD-10-CM | POA: Diagnosis not present

## 2018-03-01 DIAGNOSIS — C9000 Multiple myeloma not having achieved remission: Secondary | ICD-10-CM | POA: Insufficient documentation

## 2018-03-01 HISTORY — PX: IR IMAGING GUIDED PORT INSERTION: IMG5740

## 2018-03-01 LAB — CBC
HCT: 43.4 % (ref 36.0–46.0)
Hemoglobin: 14.3 g/dL (ref 12.0–15.0)
MCH: 34.6 pg — ABNORMAL HIGH (ref 26.0–34.0)
MCHC: 32.9 g/dL (ref 30.0–36.0)
MCV: 105.1 fL — ABNORMAL HIGH (ref 78.0–100.0)
Platelets: ADEQUATE 10*3/uL (ref 150–400)
RBC: 4.13 MIL/uL (ref 3.87–5.11)
RDW: 11.9 % (ref 11.5–15.5)
WBC: 7.2 10*3/uL (ref 4.0–10.5)

## 2018-03-01 MED ORDER — CEFAZOLIN SODIUM-DEXTROSE 2-4 GM/100ML-% IV SOLN
2.0000 g | INTRAVENOUS | Status: AC
  Administered 2018-03-01: 2 g via INTRAVENOUS

## 2018-03-01 MED ORDER — MIDAZOLAM HCL 2 MG/2ML IJ SOLN
INTRAMUSCULAR | Status: AC
  Filled 2018-03-01: qty 4

## 2018-03-01 MED ORDER — HEPARIN SOD (PORK) LOCK FLUSH 100 UNIT/ML IV SOLN
INTRAVENOUS | Status: AC
  Filled 2018-03-01: qty 5

## 2018-03-01 MED ORDER — FENTANYL CITRATE (PF) 100 MCG/2ML IJ SOLN
INTRAMUSCULAR | Status: AC
  Filled 2018-03-01: qty 4

## 2018-03-01 MED ORDER — SODIUM CHLORIDE 0.9 % IV SOLN: INTRAVENOUS | Status: DC

## 2018-03-01 MED ORDER — LIDOCAINE HCL (PF) 1 % IJ SOLN
INTRAMUSCULAR | Status: AC | PRN
  Administered 2018-03-01: 10 mL

## 2018-03-01 MED ORDER — LIDOCAINE HCL 1 % IJ SOLN
INTRAMUSCULAR | Status: AC
  Filled 2018-03-01: qty 20

## 2018-03-01 MED ORDER — MIDAZOLAM HCL 2 MG/2ML IJ SOLN
INTRAMUSCULAR | Status: AC | PRN
  Administered 2018-03-01 (×2): 1 mg via INTRAVENOUS

## 2018-03-01 MED ORDER — FLUMAZENIL 0.5 MG/5ML IV SOLN
INTRAVENOUS | Status: AC
  Filled 2018-03-01: qty 5

## 2018-03-01 MED ORDER — CEFAZOLIN SODIUM-DEXTROSE 2-4 GM/100ML-% IV SOLN
INTRAVENOUS | Status: AC
  Filled 2018-03-01: qty 100

## 2018-03-01 MED ORDER — FENTANYL CITRATE (PF) 100 MCG/2ML IJ SOLN
INTRAMUSCULAR | Status: AC | PRN
  Administered 2018-03-01 (×2): 50 ug via INTRAVENOUS

## 2018-03-01 MED ORDER — NALOXONE HCL 0.4 MG/ML IJ SOLN
INTRAMUSCULAR | Status: AC
  Filled 2018-03-01: qty 1

## 2018-03-01 NOTE — Procedures (Signed)
RIJV PAC SVC RA EBL 0 Comp 0 

## 2018-03-01 NOTE — Discharge Instructions (Addendum)
Implanted Port Insertion, Care After °This sheet gives you information about how to care for yourself after your procedure. Your health care provider may also give you more specific instructions. If you have problems or questions, contact your health care provider. °What can I expect after the procedure? °After your procedure, it is common to have: °· Discomfort at the port insertion site. °· Bruising on the skin over the port. This should improve over 3-4 days. ° °Follow these instructions at home: °Port care °· After your port is placed, you will get a manufacturer's information card. The card has information about your port. Keep this card with you at all times. °· Take care of the port as told by your health care provider. Ask your health care provider if you or a family member can get training for taking care of the port at home. A home health care nurse may also take care of the port. °· Make sure to remember what type of port you have. °Incision care °· Follow instructions from your health care provider about how to take care of your port insertion site. Make sure you: °? Wash your hands with soap and water before you change your bandage (dressing). If soap and water are not available, use hand sanitizer. °? Change your dressing as told by your health care provider. °? Leave stitches (sutures), skin glue, or adhesive strips in place. These skin closures may need to stay in place for 2 weeks or longer. If adhesive strip edges start to loosen and curl up, you may trim the loose edges. Do not remove adhesive strips completely unless your health care provider tells you to do that. °· Check your port insertion site every day for signs of infection. Check for: °? More redness, swelling, or pain. °? More fluid or blood. °? Warmth. °? Pus or a bad smell. °General instructions °· Do not take baths, swim, or use a hot tub until your health care provider approves. °· Do not lift anything that is heavier than 10 lb (4.5  kg) for a week, or as told by your health care provider. °· Ask your health care provider when it is okay to: °? Return to work or school. °? Resume usual physical activities or sports. °· Do not drive for 24 hours if you were given a medicine to help you relax (sedative). °· Take over-the-counter and prescription medicines only as told by your health care provider. °· Wear a medical alert bracelet in case of an emergency. This will tell any health care providers that you have a port. °· Keep all follow-up visits as told by your health care provider. This is important. °Contact a health care provider if: °· You cannot flush your port with saline as directed, or you cannot draw blood from the port. °· You have a fever or chills. °· You have more redness, swelling, or pain around your port insertion site. °· You have more fluid or blood coming from your port insertion site. °· Your port insertion site feels warm to the touch. °· You have pus or a bad smell coming from the port insertion site. °Get help right away if: °· You have chest pain or shortness of breath. °· You have bleeding from your port that you cannot control. °Summary °· Take care of the port as told by your health care provider. °· Change your dressing as told by your health care provider. °· Keep all follow-up visits as told by your health care provider. °  This information is not intended to replace advice given to you by your health care provider. Make sure you discuss any questions you have with your health care provider. °Document Released: 04/02/2013 Document Revised: 05/03/2016 Document Reviewed: 05/03/2016 °Elsevier Interactive Patient Education © 2017 Elsevier Inc. °Moderate Conscious Sedation, Adult, Care After °These instructions provide you with information about caring for yourself after your procedure. Your health care provider may also give you more specific instructions. Your treatment has been planned according to current medical  practices, but problems sometimes occur. Call your health care provider if you have any problems or questions after your procedure. °What can I expect after the procedure? °After your procedure, it is common: °· To feel sleepy for several hours. °· To feel clumsy and have poor balance for several hours. °· To have poor judgment for several hours. °· To vomit if you eat too soon. ° °Follow these instructions at home: °For at least 24 hours after the procedure: ° °· Do not: °? Participate in activities where you could fall or become injured. °? Drive. °? Use heavy machinery. °? Drink alcohol. °? Take sleeping pills or medicines that cause drowsiness. °? Make important decisions or sign legal documents. °? Take care of children on your own. °· Rest. °Eating and drinking °· Follow the diet recommended by your health care provider. °· If you vomit: °? Drink water, juice, or soup when you can drink without vomiting. °? Make sure you have little or no nausea before eating solid foods. °General instructions °· Have a responsible adult stay with you until you are awake and alert. °· Take over-the-counter and prescription medicines only as told by your health care provider. °· If you smoke, do not smoke without supervision. °· Keep all follow-up visits as told by your health care provider. This is important. °Contact a health care provider if: °· You keep feeling nauseous or you keep vomiting. °· You feel light-headed. °· You develop a rash. °· You have a fever. °Get help right away if: °· You have trouble breathing. °This information is not intended to replace advice given to you by your health care provider. Make sure you discuss any questions you have with your health care provider. °Document Released: 04/02/2013 Document Revised: 11/15/2015 Document Reviewed: 10/02/2015 °Elsevier Interactive Patient Education © 2018 Elsevier Inc. ° °

## 2018-03-01 NOTE — Telephone Encounter (Signed)
Oral Oncology Patient Advocate Encounter  Pomalyst copay was high so I filled out a Patent examiner. I met the patient in the lobby and had her sign the application and made a copy of her tax return. Dr. Alvy Bimler signed her portion and I faxed the application on 0/2/89  I received confirmation on 02/28/18 that the patient was approved to receive pomalyst for free and be shipped to her home.  The patient is aware and understands that [employer] will get in contact with her for shipment. She expressed great appreciation.  [place] Patient [place] Phone [phone] Fax [phone]

## 2018-03-01 NOTE — Consult Note (Signed)
Chief Complaint: Patient was seen in consultation today for multiple myeloma  Referring Physician(s): [name]  Supervising Physician: Marybelle Killings  Patient Status: [facility] - Out-pt  History of Present Illness: Jean Davidson is a 72 y.o. female with past medical history of anxiety, GERD, borderline HTN, and multiple myeloma who was recently found to have recurrent disease.  She is in need of durable venous access for chemotherapy initiation.  She presents today in her usual state of health.  She has been NPO.  She does not take blood thinners.   Past Medical History:  Diagnosis Date  . Anemia   . Anxiety   . Blood transfusion without reported diagnosis   . Cancer ([place])    remission for over 1 year  . Carotid stenosis    Mild bilateral  . Carpal tunnel syndrome   . Cervical disc disease    c7  . Cough 07/27/2015  . Disc degeneration   . Dysuria 07/26/2015  . 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 Fatigue 01/26/2015  . Fever 07/27/2015  . GERD (gastroesophageal reflux disease)   . 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 Hypertension    Borderline.  . Hypokalemia 07/16/2015  . Internal and external hemorrhoids without complication   . Pinched nerve   . Sinusitis, bacterial 07/27/2015  . Spinal stenosis in cervical region   . Varicose veins of lower extremities with inflammation     Past Surgical History:  Procedure Laterality Date  . bladder tact  1988/1989   x2  . COLONOSCOPY    . PORT-A-CATH REMOVAL    . SHOULDER SURGERY Right 04/06/13   right    Allergies: Patient has no known allergies.  Medications: Prior to Admission medications   Medication Sig Start Date End Date Taking? Authorizing Provider  carvedilol (COREG) 3.125 MG tablet Take 1 tablet (3.125 mg total) by mouth 2 (two) times daily with a meal. 12/19/17  Yes [name], MD  Cholecalciferol (VITAMIN D-1000 MAX ST) 1000 units tablet Take 1,000 Units by mouth daily.   Yes [provider]  docusate sodium (COLACE) 100 MG capsule Take by mouth daily as needed.    Yes [provider]  loperamide (IMODIUM) 2 MG capsule Take by mouth as needed for diarrhea or loose stools. As needed only   Yes [provider]  mirtazapine (REMERON) 30 MG tablet Take 1 tablet by mouth at bedtime.   Yes [provider]  Multiple Vitamins-Minerals (ICAPS AREDS 2) CAPS Take 1 capsule by mouth 2 (two) times daily.   Yes [provider]  nystatin (MYCOSTATIN/NYSTOP) powder Apply topically 4 (four) times daily. Patient taking differently: Apply 1 Bottle topically 4 (four) times daily.  12/20/17  Yes Gorsuch, Ni, MD  acyclovir (ZOVIRAX) 400 MG tablet Take 1 tablet (400 mg total) by mouth 2 (two) times daily. 02/20/18   [name], MD  dexamethasone (DECADRON) 4 MG tablet Take weekly 20 mg with food 02/20/18   [name], MD  lidocaine-prilocaine (EMLA) cream Apply to affected area once 02/20/18   [name], MD  Multiple Vitamin (MULTIVITAMIN WITH MINERALS) TABS tablet Take 1 tablet by mouth daily.    [provider]  ondansetron (ZOFRAN) 8 MG tablet Take 1 tablet (8 mg total) by mouth every 8 (eight) hours as needed (Nausea or vomiting). 02/20/18   [name], MD  pantoprazole (PROTONIX) 40 MG tablet TAKE 1 TABLET (40 MG TOTAL) BY MOUTH DAILY BEFORE BREAKFAST. 01/28/18  Gatha Mayer, MD  pomalidomide (POMALYST) 2 MG capsule Take 1 capsule (2 mg total) by mouth daily. Take with water on days 1-21. Repeat every 28 days. 02/28/18   [name], MD  prochlorperazine (COMPAZINE) 10 MG tablet Take 1 tablet (10 mg total) by mouth every 6 (six) hours as needed (Nausea or vomiting). 02/20/18   [name], MD     Family History  Problem Relation Age of Onset  . Heart disease Mother   . Heart failure Father   . Heart disease Father   . Throat cancer Father   . Skin cancer Father   . Diabetes Father   . Kidney disease Father   . Hypertension Brother   . Heart disease  Brother        congenital shunt, ?   . Colon cancer Paternal Aunt 68  . Skin cancer Brother   . Stomach cancer Neg Hx     Social History   Socioeconomic History  . Marital status: Divorced    Spouse name: Not on file  . Number of children: Not on file  . Years of education: Not on file  . Highest education level: Not on file  Occupational History  . Not on file  Social Needs  . Financial resource strain: Not on file  . Food insecurity:    Worry: Not on file    Inability: Not on file  . Transportation needs:    Medical: Not on file    Non-medical: Not on file  Tobacco Use  . Smoking status: Former Smoker    Packs/day: 0.50    Years: 4.00    Pack years: 2.00    Last attempt to quit: 06/26/1968    Years since quitting: 49.7  . Smokeless tobacco: Never Used  Substance and Sexual Activity  . Alcohol use: Yes    Alcohol/week: 1.0 standard drinks    Types: 1 Cans of beer per week    Comment: 1 per week per pt  . Drug use: No  . Sexual activity: Not on file  Lifestyle  . Physical activity:    Days per week: Not on file    Minutes per session: Not on file  . Stress: Not on file  Relationships  . Social connections:    Talks on phone: Not on file    Gets together: Not on file    Attends religious service: Not on file    Active member of club or organization: Not on file    Attends meetings of clubs or organizations: Not on file    Relationship status: Not on file  Other Topics Concern  . Not on file  Social History Narrative   Chauncey Reading is daughter arvetta, araque will,  full code ( reviewed 2015)     Review of Systems: A 12 point ROS discussed and pertinent positives are indicated in the HPI above.  All other systems are negative.  Review of Systems  Constitutional: Negative for fatigue and fever.  Respiratory: Negative for cough and shortness of breath.   Cardiovascular: Negative for chest pain.  Gastrointestinal: Negative for abdominal pain.    Genitourinary: Negative for dysuria.  Musculoskeletal: Negative for back pain.  Psychiatric/Behavioral: Negative for behavioral problems and confusion.    Vital Signs: BP (!) 147/74   Pulse 75   Temp (!) 96.9 F (36.1 C) (Temporal)   Resp 18   Ht _0  (1.6 m)   Wt 162 lb (73.5 kg)   SpO2 99%  BMI 28.70 kg/m   Physical Exam  Constitutional: She is oriented to person, place, and time. She appears well-developed. No distress.  Cardiovascular: Normal rate, regular rhythm and normal heart sounds. Exam reveals no gallop and no friction rub.  No murmur heard. Pulmonary/Chest: Effort normal and breath sounds normal. No respiratory distress.  Abdominal: Soft. Bowel sounds are normal. She exhibits no distension.  Neurological: She is alert and oriented to person, place, and time.  Skin: Skin is warm and dry. She is not diaphoretic.  Psychiatric: She has a normal mood and affect. Her behavior is normal. Judgment and thought content normal.  Nursing note and vitals reviewed.    MD Evaluation Airway: WNL Heart: WNL Abdomen: WNL Chest/ Lungs: WNL ASA  Classification: 3 Mallampati/Airway Score: Two   Imaging: No results found.  Labs:  CBC: Recent Labs    09/06/17 1210 12/20/17 1144 03/01/18 0750  WBC 6.5 9.8 7.2  HGB 15.1 14.5 14.3  HCT 45.7 43.4 43.4  PLT 192 155 PENDING    COAGS: No results for input(s): INR, APTT in the last 8760 hours.  BMP: Recent Labs    09/06/17 1210 12/20/17 1144  NA 140 140  K 4.1 3.8  CL 106 105  CO2 25 27  GLUCOSE 91 90  BUN 13 12  CALCIUM 9.6 9.5  CREATININE 0.83 0.75  GFRNONAA >60 >60  GFRAA >60 >60    LIVER FUNCTION TESTS: Recent Labs    09/06/17 1210 12/20/17 1144  BILITOT 0.3 0.3  AST 22 19  ALT 20 18  ALKPHOS 98 103  PROT 6.9 7.1  ALBUMIN 3.8 3.9    TUMOR MARKERS: No results for input(s): AFPTM, CEA, CA199, CHROMGRNA in the last 8760 hours.  Assessment and Plan: Patient with past medical history of  multiple myeloma presents with complaint of recurrent disease.  IR consulted for Port-A-Cath placement at the request of Dr. Alvy Bimler. Patient presents today in their usual state of health.  She has been NPO and is not currently on blood thinners.   Risks and benefits of image guided port-a-catheter placement was discussed with the patient including, but not limited to bleeding, infection, pneumothorax, or fibrin sheath development and need for additional procedures.  All of the patient's questions were answered, patient is agreeable to proceed. Consent signed and in chart.  Thank you for this interesting consult.  I greatly enjoyed meeting COREEN SHIPPEE and look forward to participating in their care.  A copy of this report was sent to the requesting provider on this date.  Electronically Signed: Docia Barrier, PA 03/01/2018, 9:12 AM   I spent a total of  30 Minutes   in face to face in clinical consultation, greater than 50% of which was counseling/coordinating care for multiple myeloma.

## 2018-03-04 ENCOUNTER — Inpatient Hospital Stay: Payer: Medicare HMO | Attending: Hematology and Oncology

## 2018-03-04 ENCOUNTER — Inpatient Hospital Stay: Payer: Medicare HMO

## 2018-03-04 DIAGNOSIS — Z5112 Encounter for antineoplastic immunotherapy: Secondary | ICD-10-CM | POA: Diagnosis present

## 2018-03-04 DIAGNOSIS — Z7982 Long term (current) use of aspirin: Secondary | ICD-10-CM | POA: Insufficient documentation

## 2018-03-04 DIAGNOSIS — Z9481 Bone marrow transplant status: Secondary | ICD-10-CM | POA: Insufficient documentation

## 2018-03-04 DIAGNOSIS — M8580 Other specified disorders of bone density and structure, unspecified site: Secondary | ICD-10-CM | POA: Diagnosis not present

## 2018-03-04 DIAGNOSIS — Z9221 Personal history of antineoplastic chemotherapy: Secondary | ICD-10-CM | POA: Insufficient documentation

## 2018-03-04 DIAGNOSIS — C9002 Multiple myeloma in relapse: Secondary | ICD-10-CM | POA: Diagnosis not present

## 2018-03-04 DIAGNOSIS — R509 Fever, unspecified: Secondary | ICD-10-CM | POA: Diagnosis not present

## 2018-03-04 DIAGNOSIS — Z79899 Other long term (current) drug therapy: Secondary | ICD-10-CM | POA: Insufficient documentation

## 2018-03-04 DIAGNOSIS — D469 Myelodysplastic syndrome, unspecified: Secondary | ICD-10-CM

## 2018-03-04 LAB — CMP (CANCER CENTER ONLY)
ALT: 19 U/L (ref 0–44)
AST: 20 U/L (ref 15–41)
Albumin: 4.1 g/dL (ref 3.5–5.0)
Alkaline Phosphatase: 97 U/L (ref 38–126)
Anion gap: 10 (ref 5–15)
BUN: 12 mg/dL (ref 8–23)
CO2: 27 mmol/L (ref 22–32)
Calcium: 10 mg/dL (ref 8.9–10.3)
Chloride: 105 mmol/L (ref 98–111)
Creatinine: 0.79 mg/dL (ref 0.44–1.00)
GFR, Est AFR Am: >60 mL/min (ref >60)
GFR, Estimated: >60 mL/min (ref >60)
Glucose, Bld: 110 mg/dL — ABNORMAL HIGH (ref 70–99)
Potassium: 4.3 mmol/L (ref 3.5–5.1)
Sodium: 142 mmol/L (ref 135–145)
Total Bilirubin: 0.3 mg/dL (ref 0.3–1.2)
Total Protein: 7.3 g/dL (ref 6.5–8.1)

## 2018-03-04 LAB — CBC WITH DIFFERENTIAL (CANCER CENTER ONLY)
Basophils Absolute: 0 10*3/uL (ref 0.0–0.1)
Basophils Relative: 0 %
Eosinophils Absolute: 0 10*3/uL (ref 0.0–0.5)
Eosinophils Relative: 0 %
HCT: 44.2 % (ref 34.8–46.6)
Hemoglobin: 14.5 g/dL (ref 11.6–15.9)
Lymphocytes Relative: 24 %
Lymphs Abs: 2.1 10*3/uL (ref 0.9–3.3)
MCH: 34.1 pg — ABNORMAL HIGH (ref 25.1–34.0)
MCHC: 32.8 g/dL (ref 31.5–36.0)
MCV: 104 fL — ABNORMAL HIGH (ref 79.5–101.0)
Monocytes Absolute: 0.1 10*3/uL (ref 0.1–0.9)
Monocytes Relative: 1 %
Neutro Abs: 6.2 10*3/uL (ref 1.5–6.5)
Neutrophils Relative %: 75 %
Platelet Count: 209 10*3/uL (ref 145–400)
RBC: 4.25 MIL/uL (ref 3.70–5.45)
RDW: 12.5 % (ref 11.2–14.5)
WBC Count: 8.4 10*3/uL (ref 3.9–10.3)

## 2018-03-04 LAB — URIC ACID: Uric Acid, Serum: 3 mg/dL (ref 2.5–7.1)

## 2018-03-04 MED ORDER — SODIUM CHLORIDE 0.9% FLUSH
10.0000 mL | Freq: Once | INTRAVENOUS | Status: AC
  Administered 2018-03-04: 10 mL
  Filled 2018-03-04: qty 10

## 2018-03-04 MED ORDER — HEPARIN SOD (PORK) LOCK FLUSH 100 UNIT/ML IV SOLN
250.0000 [IU] | Freq: Once | INTRAVENOUS | Status: AC
  Administered 2018-03-04: 250 [IU]
  Filled 2018-03-04: qty 5

## 2018-03-04 NOTE — Patient Instructions (Signed)
Implanted Port Home Guide An implanted port is a type of central line that is placed under the skin. Central lines are used to provide IV access when treatment or nutrition needs to be given through a person's veins. Implanted ports are used for long-term IV access. An implanted port may be placed because:  You need IV medicine that would be irritating to the small veins in your hands or arms.  You need long-term IV medicines, such as antibiotics.  You need IV nutrition for a long period.  You need frequent blood draws for lab tests.  You need dialysis.  Implanted ports are usually placed in the chest area, but they can also be placed in the upper arm, the abdomen, or the leg. An implanted port has two main parts:  Reservoir. The reservoir is round and will appear as a small, raised area under your skin. The reservoir is the part where a needle is inserted to give medicines or draw blood.  Catheter. The catheter is a thin, flexible tube that extends from the reservoir. The catheter is placed into a large vein. Medicine that is inserted into the reservoir goes into the catheter and then into the vein.  How will I care for my incision site? Do not get the incision site wet. Bathe or shower as directed by your health care provider. How is my port accessed? Special steps must be taken to access the port:  Before the port is accessed, a numbing cream can be placed on the skin. This helps numb the skin over the port site.  Your health care provider uses a sterile technique to access the port. ? Your health care provider must put on a mask and sterile gloves. ? The skin over your port is cleaned carefully with an antiseptic and allowed to dry. ? The port is gently pinched between sterile gloves, and a needle is inserted into the port.  Only "non-coring" port needles should be used to access the port. Once the port is accessed, a blood return should be checked. This helps ensure that the port  is in the vein and is not clogged.  If your port needs to remain accessed for a constant infusion, a clear (transparent) bandage will be placed over the needle site. The bandage and needle will need to be changed every week, or as directed by your health care provider.  Keep the bandage covering the needle clean and dry. Do not get it wet. Follow your health care provider's instructions on how to take a shower or bath while the port is accessed.  If your port does not need to stay accessed, no bandage is needed over the port.  What is flushing? Flushing helps keep the port from getting clogged. Follow your health care provider's instructions on how and when to flush the port. Ports are usually flushed with saline solution or a medicine called heparin. The need for flushing will depend on how the port is used.  If the port is used for intermittent medicines or blood draws, the port will need to be flushed: ? After medicines have been given. ? After blood has been drawn. ? As part of routine maintenance.  If a constant infusion is running, the port may not need to be flushed.  How long will my port stay implanted? The port can stay in for as long as your health care provider thinks it is needed. When it is time for the port to come out, surgery will be   done to remove it. The procedure is similar to the one performed when the port was put in. When should I seek immediate medical care? When you have an implanted port, you should seek immediate medical care if:  You notice a bad smell coming from the incision site.  You have swelling, redness, or drainage at the incision site.  You have more swelling or pain at the port site or the surrounding area.  You have a fever that is not controlled with medicine.  This information is not intended to replace advice given to you by your health care provider. Make sure you discuss any questions you have with your health care provider. Document  Released: 06/12/2005 Document Revised: 11/18/2015 Document Reviewed: 02/17/2013 Elsevier Interactive Patient Education  2017 Elsevier Inc.  

## 2018-03-05 LAB — KAPPA/LAMBDA LIGHT CHAINS
Kappa free light chain: 10.6 mg/L (ref 3.3–19.4)
Kappa, lambda light chain ratio: 0.18 — ABNORMAL LOW (ref 0.26–1.65)
Lambda free light chains: 60.3 mg/L — ABNORMAL HIGH (ref 5.7–26.3)

## 2018-03-05 LAB — MULTIPLE MYELOMA PANEL, SERUM
Albumin SerPl Elph-Mcnc: 3.8 g/dL (ref 2.9–4.4)
Albumin/Glob SerPl: 1.4 (ref 0.7–1.7)
Alpha 1: 0.2 g/dL (ref 0.0–0.4)
Alpha2 Glob SerPl Elph-Mcnc: 0.9 g/dL (ref 0.4–1.0)
B-Globulin SerPl Elph-Mcnc: 0.8 g/dL (ref 0.7–1.3)
Gamma Glob SerPl Elph-Mcnc: 1 g/dL (ref 0.4–1.8)
Globulin, Total: 2.9 g/dL (ref 2.2–3.9)
IgA: 354 mg/dL (ref 64–422)
IgG (Immunoglobin G), Serum: 691 mg/dL — ABNORMAL LOW (ref 700–1600)
IgM (Immunoglobulin M), Srm: 221 mg/dL — ABNORMAL HIGH (ref 26–217)
M Protein SerPl Elph-Mcnc: 0.3 g/dL — ABNORMAL HIGH
Total Protein ELP: 6.7 g/dL (ref 6.0–8.5)

## 2018-03-05 LAB — BETA 2 MICROGLOBULIN, SERUM: Beta-2 Microglobulin: 2.2 mg/L (ref 0.6–2.4)

## 2018-03-05 NOTE — Telephone Encounter (Signed)
Oral Oncology Patient Advocate Encounter  Confirmed with Jean Davidson that Pomalyst was picked up at [facility] 03/05/18. Patient states she will start taking this tomorrow 9/11 like she was told.   [place] Patient [place] Phone [phone] Fax [phone]

## 2018-03-06 ENCOUNTER — Encounter: Payer: Self-pay | Admitting: General Practice

## 2018-03-06 ENCOUNTER — Inpatient Hospital Stay: Payer: Medicare HMO

## 2018-03-06 VITALS — BP 148/78 | HR 71 | Temp 98.9°F | Resp 16

## 2018-03-06 DIAGNOSIS — Z7982 Long term (current) use of aspirin: Secondary | ICD-10-CM | POA: Diagnosis not present

## 2018-03-06 DIAGNOSIS — Z9481 Bone marrow transplant status: Secondary | ICD-10-CM | POA: Diagnosis not present

## 2018-03-06 DIAGNOSIS — Z9221 Personal history of antineoplastic chemotherapy: Secondary | ICD-10-CM | POA: Diagnosis not present

## 2018-03-06 DIAGNOSIS — M8580 Other specified disorders of bone density and structure, unspecified site: Secondary | ICD-10-CM | POA: Diagnosis not present

## 2018-03-06 DIAGNOSIS — C9002 Multiple myeloma in relapse: Secondary | ICD-10-CM

## 2018-03-06 DIAGNOSIS — R509 Fever, unspecified: Secondary | ICD-10-CM | POA: Diagnosis not present

## 2018-03-06 DIAGNOSIS — Z5112 Encounter for antineoplastic immunotherapy: Secondary | ICD-10-CM | POA: Diagnosis not present

## 2018-03-06 DIAGNOSIS — Z79899 Other long term (current) drug therapy: Secondary | ICD-10-CM | POA: Diagnosis not present

## 2018-03-06 LAB — TYPE AND SCREEN
ABO/RH(D): A POS
Antibody Screen: NEGATIVE

## 2018-03-06 MED ORDER — SODIUM CHLORIDE 0.9 % IV SOLN
Freq: Once | INTRAVENOUS | Status: AC
  Administered 2018-03-06: 08:00:00 via INTRAVENOUS
  Filled 2018-03-06: qty 250

## 2018-03-06 MED ORDER — SODIUM CHLORIDE 0.9 % IV SOLN
20.0000 mg | Freq: Once | INTRAVENOUS | Status: AC
  Administered 2018-03-06: 20 mg via INTRAVENOUS
  Filled 2018-03-06: qty 2

## 2018-03-06 MED ORDER — ACETAMINOPHEN 325 MG PO TABS
ORAL_TABLET | ORAL | Status: AC
  Filled 2018-03-06: qty 2

## 2018-03-06 MED ORDER — PROCHLORPERAZINE MALEATE 10 MG PO TABS
10.0000 mg | ORAL_TABLET | Freq: Once | ORAL | Status: AC
  Administered 2018-03-06: 10 mg via ORAL

## 2018-03-06 MED ORDER — HEPARIN SOD (PORK) LOCK FLUSH 100 UNIT/ML IV SOLN
500.0000 [IU] | Freq: Once | INTRAVENOUS | Status: AC | PRN
  Administered 2018-03-06: 500 [IU]
  Filled 2018-03-06: qty 5

## 2018-03-06 MED ORDER — DIPHENHYDRAMINE HCL 25 MG PO CAPS
50.0000 mg | ORAL_CAPSULE | Freq: Once | ORAL | Status: AC
  Administered 2018-03-06: 50 mg via ORAL

## 2018-03-06 MED ORDER — PROCHLORPERAZINE MALEATE 10 MG PO TABS
ORAL_TABLET | ORAL | Status: AC
  Filled 2018-03-06: qty 1

## 2018-03-06 MED ORDER — MONTELUKAST SODIUM 10 MG PO TABS
ORAL_TABLET | ORAL | Status: AC
  Filled 2018-03-06: qty 1

## 2018-03-06 MED ORDER — ACETAMINOPHEN 325 MG PO TABS
650.0000 mg | ORAL_TABLET | Freq: Once | ORAL | Status: AC
  Administered 2018-03-06: 650 mg via ORAL

## 2018-03-06 MED ORDER — SODIUM CHLORIDE 0.9 % IV SOLN
16.0000 mg/kg | Freq: Once | INTRAVENOUS | Status: AC
  Administered 2018-03-06: 1200 mg via INTRAVENOUS
  Filled 2018-03-06: qty 60

## 2018-03-06 MED ORDER — SODIUM CHLORIDE 0.9% FLUSH
10.0000 mL | INTRAVENOUS | Status: DC | PRN
  Administered 2018-03-06: 10 mL
  Filled 2018-03-06: qty 10

## 2018-03-06 MED ORDER — DIPHENHYDRAMINE HCL 25 MG PO CAPS
ORAL_CAPSULE | ORAL | Status: AC
  Filled 2018-03-06: qty 2

## 2018-03-06 MED ORDER — MONTELUKAST SODIUM 10 MG PO TABS
10.0000 mg | ORAL_TABLET | Freq: Once | ORAL | Status: AC
  Administered 2018-03-06: 10 mg via ORAL

## 2018-03-06 NOTE — Progress Notes (Signed)
Jean Davidson Spiritual Care Note  Visited with Jean Davidson in infusion after hearing from her via email response to our Blood Cancer Support Group monthly reminder. Jean Davidson is working to maintain positive attitude and reports good support, though certainly relapse is disappointing. Knowing that her current regimen was effective last time is a source of encouragement for her. Per pt, she has been more involved with care and support programs at [facility] recently, but may connect again with [facility]'s blood cancer group as energy and motivation allow. She utilizes Spiritual Care well, and we plan to f/u when she is on campus for tx, but please also page if immediate needs arise or circumstances change. Thank you.   [place], [place], [facility] Pager [phone] Voicemail [phone]

## 2018-03-06 NOTE — Patient Instructions (Signed)
[facility] Discharge Instructions for Patients Receiving Chemotherapy  Today you received the following chemotherapy agents Darzalex  To help prevent nausea and vomiting after your treatment, we encourage you to take your nausea medication as prescribed by your physician.   If you develop nausea and vomiting that is not controlled by your nausea medication, call the clinic.   BELOW ARE SYMPTOMS THAT SHOULD BE REPORTED IMMEDIATELY:  *FEVER GREATER THAN 100.5 F  *CHILLS WITH OR WITHOUT FEVER  NAUSEA AND VOMITING THAT IS NOT CONTROLLED WITH YOUR NAUSEA MEDICATION  *UNUSUAL SHORTNESS OF BREATH  *UNUSUAL BRUISING OR BLEEDING  TENDERNESS IN MOUTH AND THROAT WITH OR WITHOUT PRESENCE OF ULCERS  *URINARY PROBLEMS  *BOWEL PROBLEMS  UNUSUAL RASH Items with * indicate a potential emergency and should be followed up as soon as possible.  Feel free to call the clinic should you have any questions or concerns. The clinic phone number is [phone].  Please show the [place] at check-in to the Emergency Department and triage nurse.  Daratumumab injection What is this medicine? DARATUMUMAB (dar a toom ue mab) is a monoclonal antibody. It is used to treat multiple myeloma. This medicine may be used for other purposes; ask your health care provider or pharmacist if you have questions. COMMON BRAND NAME(S): DARZALEX What should I tell my health care provider before I take this medicine? They need to know if you have any of these conditions: -infection (especially a virus infection such as chickenpox, cold sores, or herpes) -lung or breathing disease -pregnant or trying to get pregnant -breast-feeding -an unusual or allergic reaction to daratumumab, other medicines, foods, dyes, or preservatives How should I use this medicine? This medicine is for infusion into a vein. It is given by a health care professional in a hospital or clinic setting. Talk to your  pediatrician regarding the use of this medicine in children. Special care may be needed. Overdosage: If you think you have taken too much of this medicine contact a poison control center or emergency room at once. NOTE: This medicine is only for you. Do not share this medicine with others. What if I miss a dose? Keep appointments for follow-up doses as directed. It is important not to miss your dose. Call your doctor or health care professional if you are unable to keep an appointment. What may interact with this medicine? Interactions have not been studied. Give your health care provider a list of all the medicines, herbs, non-prescription drugs, or dietary supplements you use. Also tell them if you smoke, drink alcohol, or use illegal drugs. Some items may interact with your medicine. This list may not describe all possible interactions. Give your health care provider a list of all the medicines, herbs, non-prescription drugs, or dietary supplements you use. Also tell them if you smoke, drink alcohol, or use illegal drugs. Some items may interact with your medicine. What should I watch for while using this medicine? This drug may make you feel generally unwell. Report any side effects. Continue your course of treatment even though you feel ill unless your doctor tells you to stop. This medicine can cause serious allergic reactions. To reduce your risk you may need to take medicine before treatment with this medicine. Take your medicine as directed. This medicine can affect the results of blood tests to match your blood type. These changes can last for up to 6 months after the final dose. Your healthcare provider will do blood tests to match  your blood type before you start treatment. Tell all of your healthcare providers that you are being treated with this medicine before receiving a blood transfusion. This medicine can affect the results of some tests used to determine treatment response; extra  tests may be needed to evaluate response. Do not become pregnant while taking this medicine or for 3 months after stopping it. Women should inform their doctor if they wish to become pregnant or think they might be pregnant. There is a potential for serious side effects to an unborn child. Talk to your health care professional or pharmacist for more information. What side effects may I notice from receiving this medicine? Side effects that you should report to your doctor or health care professional as soon as possible: -allergic reactions like skin rash, itching or hives, swelling of the face, lips, or tongue -breathing problems -chills -cough -dizziness -feeling faint or lightheaded -headache -low blood counts - this medicine may decrease the number of white blood cells, red blood cells and platelets. You may be at increased risk for infections and bleeding. -nausea, vomiting -shortness of breath -signs of decreased platelets or bleeding - bruising, pinpoint red spots on the skin, black, tarry stools, blood in the urine -signs of decreased red blood cells - unusually weak or tired, feeling faint or lightheaded, falls -signs of infection - fever or chills, cough, sore throat, pain or difficulty passing urine Side effects that usually do not require medical attention (report to your doctor or health care professional if they continue or are bothersome): -back pain -diarrhea -muscle cramps -pain, tingling, numbness in the hands or feet -swelling of the ankles, feet, hands -tiredness This list may not describe all possible side effects. Call your doctor for medical advice about side effects. You may report side effects to FDA at 1-800-FDA-1088. Where should I keep my medicine? Keep out of the reach of children. This drug is given in a hospital or clinic and will not be stored at home. NOTE: This sheet is a summary. It may not cover all possible information. If you have questions about this  medicine, talk to your doctor, pharmacist, or health care provider.  2018 Elsevier/Gold Standard (2015-07-15 10:38:11)

## 2018-03-07 ENCOUNTER — Encounter: Payer: Self-pay | Admitting: Hematology and Oncology

## 2018-03-07 LAB — PRETREATMENT RBC PHENOTYPE

## 2018-03-08 ENCOUNTER — Other Ambulatory Visit: Payer: Self-pay | Admitting: Hematology and Oncology

## 2018-03-08 ENCOUNTER — Encounter: Payer: Self-pay | Admitting: Hematology and Oncology

## 2018-03-08 NOTE — Telephone Encounter (Signed)
Can you call if she wants this refilled? I thought we decided to stop/reduce dose due to weight gain? This might be an electronic refill request

## 2018-03-09 ENCOUNTER — Emergency Department (HOSPITAL_COMMUNITY)
Admission: EM | Admit: 2018-03-09 | Discharge: 2018-03-09 | Disposition: A | Discharge status: Home / Self Care | Payer: Medicare HMO | Attending: Emergency Medicine | Admitting: Emergency Medicine

## 2018-03-09 ENCOUNTER — Emergency Department (HOSPITAL_COMMUNITY): Payer: Medicare HMO

## 2018-03-09 ENCOUNTER — Other Ambulatory Visit: Payer: Self-pay

## 2018-03-09 ENCOUNTER — Encounter (HOSPITAL_COMMUNITY): Payer: Self-pay

## 2018-03-09 DIAGNOSIS — Y828 Other medical devices associated with adverse incidents: Secondary | ICD-10-CM | POA: Insufficient documentation

## 2018-03-09 DIAGNOSIS — T888XXA Other specified complications of surgical and medical care, not elsewhere classified, initial encounter: Secondary | ICD-10-CM | POA: Diagnosis not present

## 2018-03-09 DIAGNOSIS — Z79899 Other long term (current) drug therapy: Secondary | ICD-10-CM | POA: Diagnosis not present

## 2018-03-09 DIAGNOSIS — Z87891 Personal history of nicotine dependence: Secondary | ICD-10-CM | POA: Diagnosis not present

## 2018-03-09 DIAGNOSIS — C9000 Multiple myeloma not having achieved remission: Secondary | ICD-10-CM | POA: Insufficient documentation

## 2018-03-09 DIAGNOSIS — R509 Fever, unspecified: Secondary | ICD-10-CM | POA: Insufficient documentation

## 2018-03-09 DIAGNOSIS — I10 Essential (primary) hypertension: Secondary | ICD-10-CM | POA: Diagnosis not present

## 2018-03-09 DIAGNOSIS — T451X5A Adverse effect of antineoplastic and immunosuppressive drugs, initial encounter: Secondary | ICD-10-CM | POA: Insufficient documentation

## 2018-03-09 DIAGNOSIS — R112 Nausea with vomiting, unspecified: Secondary | ICD-10-CM

## 2018-03-09 LAB — CBC WITH DIFFERENTIAL/PLATELET
Basophils Absolute: 0 10*3/uL (ref 0.0–0.1)
Basophils Relative: 0 %
Eosinophils Absolute: 0 10*3/uL (ref 0.0–0.7)
Eosinophils Relative: 0 %
HCT: 43.4 % (ref 36.0–46.0)
Hemoglobin: 14.3 g/dL (ref 12.0–15.0)
Lymphocytes Relative: 20 %
Lymphs Abs: 2.4 10*3/uL (ref 0.7–4.0)
MCH: 34.5 pg — ABNORMAL HIGH (ref 26.0–34.0)
MCHC: 32.9 g/dL (ref 30.0–36.0)
MCV: 104.8 fL — ABNORMAL HIGH (ref 78.0–100.0)
Monocytes Absolute: 1.1 10*3/uL — ABNORMAL HIGH (ref 0.1–1.0)
Monocytes Relative: 9 %
Neutro Abs: 8.5 10*3/uL — ABNORMAL HIGH (ref 1.7–7.7)
Neutrophils Relative %: 71 %
Platelets: 235 10*3/uL (ref 150–400)
RBC: 4.14 MIL/uL (ref 3.87–5.11)
RDW: 12.7 % (ref 11.5–15.5)
WBC: 12 10*3/uL — ABNORMAL HIGH (ref 4.0–10.5)

## 2018-03-09 LAB — COMPREHENSIVE METABOLIC PANEL
ALT: 21 U/L (ref 0–44)
AST: 21 U/L (ref 15–41)
Albumin: 3.7 g/dL (ref 3.5–5.0)
Alkaline Phosphatase: 70 U/L (ref 38–126)
Anion gap: 11 (ref 5–15)
BUN: 14 mg/dL (ref 8–23)
CO2: 25 mmol/L (ref 22–32)
Calcium: 8.6 mg/dL — ABNORMAL LOW (ref 8.9–10.3)
Chloride: 101 mmol/L (ref 98–111)
Creatinine, Ser: 0.78 mg/dL (ref 0.44–1.00)
GFR calc Af Amer: >60 mL/min (ref >60)
GFR calc non Af Amer: >60 mL/min (ref >60)
Glucose, Bld: 122 mg/dL — ABNORMAL HIGH (ref 70–99)
Potassium: 3.9 mmol/L (ref 3.5–5.1)
Sodium: 137 mmol/L (ref 135–145)
Total Bilirubin: 0.5 mg/dL (ref 0.3–1.2)
Total Protein: 6.5 g/dL (ref 6.5–8.1)

## 2018-03-09 MED ORDER — SODIUM CHLORIDE 0.9 % IV BOLUS
1000.0000 mL | Freq: Once | INTRAVENOUS | Status: AC
  Administered 2018-03-09: 1000 mL via INTRAVENOUS

## 2018-03-09 MED ORDER — ONDANSETRON HCL 4 MG/2ML IJ SOLN
4.0000 mg | Freq: Once | INTRAMUSCULAR | Status: AC
  Administered 2018-03-09: 4 mg via INTRAVENOUS
  Filled 2018-03-09: qty 2

## 2018-03-09 MED ORDER — LEVOFLOXACIN 500 MG PO TABS: 500.0000 mg | ORAL_TABLET | Freq: Every day | ORAL | 0 refills | Status: DC

## 2018-03-09 MED ORDER — ACETAMINOPHEN 325 MG PO TABS
650.0000 mg | ORAL_TABLET | Freq: Once | ORAL | Status: AC
  Administered 2018-03-09: 650 mg via ORAL
  Filled 2018-03-09: qty 2

## 2018-03-09 MED ORDER — LEVOFLOXACIN 500 MG PO TABS
500.0000 mg | ORAL_TABLET | Freq: Once | ORAL | Status: AC
  Administered 2018-03-09: 500 mg via ORAL
  Filled 2018-03-09: qty 1

## 2018-03-09 MED ORDER — HEPARIN SOD (PORK) LOCK FLUSH 100 UNIT/ML IV SOLN
500.0000 [IU] | Freq: Once | INTRAVENOUS | Status: AC
  Administered 2018-03-09: 500 [IU]
  Filled 2018-03-09: qty 5

## 2018-03-09 NOTE — Discharge Instructions (Signed)
You have been given your dose of Levaquin antibiotic today.  Starting tomorrow, take once daily as prescribed until they are gone. Continue taking Zofran as prescribed for nausea. Drink plenty of fluids. Schedule an appointment with your oncologist to follow-up on your visit today. Return to the ER for uncontrollable vomiting, persistent fever, worsening symptoms.

## 2018-03-09 NOTE — ED Provider Notes (Signed)
   Face-to-face evaluation   History: Patient here for evaluation of fever which she noticed yesterday to 100.4.  She is status post chemotherapy, 2 days ago, for multiple myeloma.  Today she feels weak and dizzy when standing, and has felt cold all day.  She denies cough or shortness of breath.  Physical exam: Alert, elderly female who appears anxious.  No respiratory distress.  She is lucid.  Medical screening examination/treatment/procedure(s) were conducted as a shared visit with non-physician practitioner(s) and myself.  I personally evaluated the patient during the encounter     Daleen Bo, MD 03/09/18 1551

## 2018-03-09 NOTE — ED Provider Notes (Addendum)
[facility] DEPT Provider Note   CSN: [id number] Arrival date & time: 03/09/18  0551     History   Chief Complaint Chief Complaint  Patient presents with  . Fever  . Nausea    HPI Jean Davidson is a 72 y.o. female currently undergoing chemotherapy treatment for multiple myeloma, presenting to the ED with complaint of fever.  She states she woke up between 4 and 5 this morning and took her temperature which resulted as 104 F.  She treated her fever with Tylenol.  States she feels associated chills.  Reports she just began chemotherapy treatment for multiple myeloma relapse on Wednesday which includes IV chemotherapy as well as an oral medication.  She reports associated nausea with dry heaving, though states her nausea has not been bad enough to take any of her prescribed Zofran.  She reports mild dry cough though relates it to her retching.  Denies sore throat, abdominal pain, urinary symptoms, or other associated symptoms.  The history is provided by the patient.    Past Medical History:  Diagnosis Date  . Anemia   . Anxiety   . Blood transfusion without reported diagnosis   . Cancer ([place])    remission for over 1 year  . Carotid stenosis    Mild bilateral  . Carpal tunnel syndrome   . Cervical disc disease    c7  . Cough 07/27/2015  . Disc degeneration   . Dysuria 07/26/2015  . 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 Fatigue 01/26/2015  . Fever 07/27/2015  . GERD (gastroesophageal reflux disease)   . 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 Hypertension    Borderline.  . Hypokalemia 07/16/2015  . Internal and external hemorrhoids without complication   . Pinched nerve   . Sinusitis, bacterial 07/27/2015  . Spinal stenosis in cervical region   . Varicose veins of lower extremities with inflammation     Patient Active Problem List   Diagnosis Date Noted  . Goals of care, counseling/discussion 02/20/2018  . Pain of left scapula 12/06/2017    . Vitamin D deficiency 11/12/2017  . Yeast infection of the skin 09/06/2017  . Family hx of colon cancer requiring screening colonoscopy 01/17/2017  . Cystocele and rectocele with incomplete uterovaginal prolapse 11/22/2016  . 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 Bilateral pleural effusion 01/25/2016  . Pericardial effusion 01/24/2016  . Physical deconditioning 01/11/2016  . 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 Therapeutic drug monitoring 11/08/2015  . Red blood cell antibody positive, compatible PRBC difficult to obtain 07/30/2015  . 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 Fatigue 01/26/2015  . Essential hypertension 01/26/2015  . Other fatigue 01/26/2015  . 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 Chronic recurrent sinusitis 10/23/2014  . Conjunctivitis 10/23/2014  . 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 Patient in clinical research study 07/21/2014  . Need for prophylactic vaccination and inoculation against influenza 02/24/2014  . Leukocytosis, unspecified 02/03/2014  . Constipation 11/21/2013  . Rash 11/21/2013  . Back pain 11/21/2013  . Anxiety 11/21/2013  . Multiple myeloma ([place]) 11/07/2013  . Thrombocytopenia ([place]) 10/29/2013  . Persistent cough for 3 weeks or longer 10/02/2013  . Abnormal weight loss 10/02/2013  . Normocytic anemia 08/22/2013  . Abdominal bloating 08/22/2013  . Hypersensitivity 08/07/2013  . Other malaise and fatigue 08/07/2013  . Osteopenia 08/05/2013  . GERD (gastroesophageal reflux disease) 01/16/2013  .  Insomnia 01/16/2013  . Carotid stenosis   . Cervical disc disease   . Hypertension 04/15/2012  . Palpitations 03/29/2012    Past Surgical History:  Procedure Laterality Date  . bladder tact  1988/1989   x2  . COLONOSCOPY    . IR IMAGING GUIDED PORT INSERTION  03/01/2018  . PORT-A-CATH  REMOVAL    . SHOULDER SURGERY Right 04/06/13   right     OB History    Gravida  1   Para  1   Term  1   Preterm      AB      Living  1     SAB      TAB      Ectopic      Multiple      Live Births  1            Home Medications    Prior to Admission medications   Medication Sig Start Date End Date Taking? Authorizing Provider  acyclovir (ZOVIRAX) 400 MG tablet Take 1 tablet (400 mg total) by mouth 2 (two) times daily. 02/20/18  Yes Gorsuch, Ni, MD  calcium carbonate (TUMS - DOSED IN MG ELEMENTAL CALCIUM) 500 MG chewable tablet Chew 1 tablet by mouth daily.   Yes [provider]  carvedilol (COREG) 3.125 MG tablet Take 1 tablet (3.125 mg total) by mouth 2 (two) times daily with a meal. 12/19/17  Yes [name], MD  Cholecalciferol (VITAMIN D-1000 MAX ST) 1000 units tablet Take 1,000 Units by mouth daily.   Yes [provider]  dexamethasone (DECADRON) 4 MG tablet Take weekly 20 mg with food 02/20/18  Yes Gorsuch, Ni, MD  docusate sodium (COLACE) 100 MG capsule Take 100 mg by mouth daily as needed for mild constipation.    Yes [provider]  lidocaine-prilocaine (EMLA) cream Apply to affected area once 02/20/18  Yes Gorsuch, Ni, MD  loperamide (IMODIUM) 2 MG capsule Take by mouth as needed for diarrhea or loose stools. As needed only   Yes [provider]  mirtazapine (REMERON) 30 MG tablet Take 1 tablet by mouth at bedtime.   Yes [provider]  Multiple Vitamin (MULTIVITAMIN WITH MINERALS) TABS tablet Take 1 tablet by mouth daily.   Yes [provider]  Multiple Vitamins-Minerals (ICAPS AREDS 2) CAPS Take 1 capsule by mouth 2 (two) times daily.   Yes [provider]  pantoprazole (PROTONIX) 40 MG tablet TAKE 1 TABLET (40 MG TOTAL) BY MOUTH DAILY BEFORE BREAKFAST. 01/28/18  Yes Gatha Mayer, MD  pomalidomide (POMALYST) 2 MG capsule Take 1 capsule (2 mg total) by mouth daily. Take with water on days  1-21. Repeat every 28 days. 02/28/18  Yes Gorsuch, Ni, MD  levofloxacin (LEVAQUIN) 500 MG tablet Take 1 tablet (500 mg total) by mouth daily. 03/09/18   Robinson, [place] N, PA-C  nystatin (MYCOSTATIN/NYSTOP) powder Apply topically 4 (four) times daily. Patient not taking: Reported on 03/09/2018 12/20/17   [name], MD  ondansetron (ZOFRAN) 8 MG tablet Take 1 tablet (8 mg total) by mouth every 8 (eight) hours as needed (Nausea or vomiting). 02/20/18   [name], MD  prochlorperazine (COMPAZINE) 10 MG tablet Take 1 tablet (10 mg total) by mouth every 6 (six) hours as needed (Nausea or vomiting). 02/20/18   [name], MD    Family History Family History  Problem Relation Age of Onset  . Heart disease Mother   . Heart failure Father   .  Heart disease Father   . Throat cancer Father   . Skin cancer Father   . Diabetes Father   . Kidney disease Father   . Hypertension Brother   . Heart disease Brother        congenital shunt, ?   . Colon cancer Paternal Aunt 3  . Skin cancer Brother   . Stomach cancer Neg Hx     Social History Social History   Tobacco Use  . Smoking status: Former Smoker    Packs/day: 0.50    Years: 4.00    Pack years: 2.00    Last attempt to quit: 06/26/1968    Years since quitting: 49.7  . Smokeless tobacco: Never Used  Substance Use Topics  . Alcohol use: Yes    Alcohol/week: 1.0 standard drinks    Types: 1 Cans of beer per week    Comment: 1 per week per pt  . Drug use: No     Allergies   Patient has no known allergies.   Review of Systems Review of Systems  Constitutional: Positive for chills and fever.  Respiratory: Positive for cough. Negative for shortness of breath.   Cardiovascular: Negative for chest pain.  Gastrointestinal: Positive for nausea. Negative for abdominal pain and vomiting.  Genitourinary: Negative for dysuria and frequency.  Allergic/Immunologic: Positive for immunocompromised state.  All other systems reviewed and are  negative.    Physical Exam Updated Vital Signs BP 135/69   Pulse 85   Temp (!) 103.2 F (39.6 C) (Oral)   Resp 15   Ht _0  (1.6 m)   Wt 73.5 kg   SpO2 95%   BMI 28.70 kg/m   Physical Exam  Constitutional: She appears well-developed and well-nourished. No distress.  HENT:  Head: Normocephalic and atraumatic.  Mouth/Throat: Oropharynx is clear and moist.  Eyes: Conjunctivae are normal.  Neck: Normal range of motion. Neck supple.  Cardiovascular: Normal rate, regular rhythm and normal heart sounds.  Pulmonary/Chest: Effort normal and breath sounds normal. No respiratory distress.  Abdominal: Soft. Bowel sounds are normal. She exhibits no distension. There is no tenderness. There is no rebound and no guarding.  Neurological: She is alert.  Skin: Skin is warm.  Psychiatric: She has a normal mood and affect. Her behavior is normal.  Nursing note and vitals reviewed.    ED Treatments / Results  Labs (all labs ordered are listed, but only abnormal results are displayed) Labs Reviewed  COMPREHENSIVE METABOLIC PANEL - Abnormal; Notable for the following components:      Result Value   Glucose, Bld 122 (*)    Calcium 8.6 (*)    All other components within normal limits  CBC WITH DIFFERENTIAL/PLATELET - Abnormal; Notable for the following components:   WBC 12.0 (*)    MCV 104.8 (*)    MCH 34.5 (*)    Neutro Abs 8.5 (*)    Monocytes Absolute 1.1 (*)    All other components within normal limits  CULTURE, BLOOD (ROUTINE X 2)  CULTURE, BLOOD (ROUTINE X 2)  URINE CULTURE  URINALYSIS, ROUTINE W REFLEX MICROSCOPIC    EKG None  Radiology Dg Chest 2 View  Result Date: 03/09/2018 CLINICAL DATA:  Fever, nausea and vomiting.  Active chemotherapy. EXAM: CHEST - 2 VIEW COMPARISON:  Radiograph _1 FINDINGS: Right chest port with tip in the mid SVC. Unchanged heart size and mediastinal contours. Chronic mild elevation of right hemidiaphragm. Calcified granuloma in the right  lung. No focal airspace  disease, pleural effusion or pneumothorax. Reverse right shoulder arthroplasty. IMPRESSION: No acute findings. Electronically Signed   By: Keith Rake M.D.   On: 03/09/2018 06:46    Procedures Procedures (including critical care time)  Medications Ordered in ED Medications  levofloxacin (LEVAQUIN) tablet 500 mg (has no administration in time range)  ondansetron (ZOFRAN) injection 4 mg (4 mg Intravenous Given 03/09/18 1027)  sodium chloride 0.9 % bolus 1,000 mL (0 mLs Intravenous Stopped 03/09/18 1146)  acetaminophen (TYLENOL) tablet 650 mg (650 mg Oral Given 03/09/18 1227)     Initial Impression / Assessment and Plan / ED Course  I have reviewed the triage vital signs and the nursing notes.  Pertinent labs & imaging results that were available during my care of the patient were reviewed by me and considered in my medical decision making (see chart for details).  Clinical Course as of Mar 10 1311  Sat Mar 09, 2018  0915 Pt re-evaluated stating she feels nauseated and will be able to provide urine sample once she is able to ambulate.   [JR]  1215 Patient discussed with oncologist, Dr. Earlie Server.  He recommends Levaquin x5 days and patient is safe for discharge with follow-up.   [JR]    Clinical Course User Index [JR] Robinson, [place] N, PA-C    Patient with multiple myeloma currently undergoing chemotherapy treatment, presenting to the ED with fever, nausea and vomiting.  Last treatment on Wednesday.  On initial evaluation patient is afebrile though treated at home prior to arrival.  Vital signs are normal.  Abdomen is soft and nontender.  Lungs are clear.  Labs revealing mild leukocytosis of 12 without neutropenia.  CMP is reassuring.  Blood culture sent.  Chest x-ray is negative.  No obvious source of infection. While awaiting urine, patient became febrile once again though with normal vital signs.  Treated in the ED with IV fluids, Zofran, and Tylenol.   Tolerating p.o.  Patient unable to provide urine specimen. patient discussed with her oncologist who recommended she is safe for discharge with Levaquin for 5 days and outpatient follow-up.  Patient agreeable to plan.  Dose of antibiotic given in ED prior to discharge.  Strict return precautions discussed.  Patient discussed with and evaluated by Dr. Eulis Foster, who agrees with care plan.  Discussed results, findings, treatment and follow up. Patient advised of return precautions. Patient verbalized understanding and agreed with plan.   Final Clinical Impressions(s) / ED Diagnoses   Final diagnoses:  Fever and chills  Chemotherapy induced nausea and vomiting    ED Discharge Orders         Ordered    levofloxacin (LEVAQUIN) 500 MG tablet  Daily     03/09/18 1308           Robinson, [place] N, PA-C 03/09/18 1312    Robinson, [place] N, [place] 03/09/18 1313    Daleen Bo, MD 03/09/18 1551

## 2018-03-09 NOTE — ED Notes (Signed)
Purwick was placed on pt. Pt. Aware of urine specimen. Will collect urine specimen when pt. Voids.

## 2018-03-09 NOTE — ED Triage Notes (Signed)
Pt presents to ED from home for fever and N/V. Pt is cancer pt that received chemo yesterday. PT reports that she felt chills and had low grade temp after infusion. Pt reports that she woke up with temp of 104F this morning.

## 2018-03-10 ENCOUNTER — Encounter: Payer: Self-pay | Admitting: Hematology and Oncology

## 2018-03-11 ENCOUNTER — Other Ambulatory Visit: Payer: Self-pay | Admitting: Hematology and Oncology

## 2018-03-11 ENCOUNTER — Telehealth: Payer: Self-pay | Admitting: Hematology and Oncology

## 2018-03-11 NOTE — Telephone Encounter (Signed)
I spoke with the patient over the telephone to follow-up on recent ER visit.  She feels fine.  All cultures are still negative. I suspect she had immune response in relationship to recent start of chemotherapy.  This is not unexpected.  Nothing further needs to be added and I reassured the patient.  We will continue treatment as scheduled this week.

## 2018-03-13 ENCOUNTER — Inpatient Hospital Stay: Payer: Medicare HMO

## 2018-03-13 ENCOUNTER — Encounter: Payer: Self-pay | Admitting: General Practice

## 2018-03-13 VITALS — BP 126/68 | HR 86 | Temp 97.6°F | Resp 17

## 2018-03-13 DIAGNOSIS — M8580 Other specified disorders of bone density and structure, unspecified site: Secondary | ICD-10-CM | POA: Diagnosis not present

## 2018-03-13 DIAGNOSIS — Z5112 Encounter for antineoplastic immunotherapy: Secondary | ICD-10-CM | POA: Diagnosis not present

## 2018-03-13 DIAGNOSIS — C9002 Multiple myeloma in relapse: Secondary | ICD-10-CM

## 2018-03-13 DIAGNOSIS — R509 Fever, unspecified: Secondary | ICD-10-CM | POA: Diagnosis not present

## 2018-03-13 DIAGNOSIS — Z7982 Long term (current) use of aspirin: Secondary | ICD-10-CM | POA: Diagnosis not present

## 2018-03-13 DIAGNOSIS — D469 Myelodysplastic syndrome, unspecified: Secondary | ICD-10-CM

## 2018-03-13 DIAGNOSIS — Z9221 Personal history of antineoplastic chemotherapy: Secondary | ICD-10-CM | POA: Diagnosis not present

## 2018-03-13 DIAGNOSIS — Z79899 Other long term (current) drug therapy: Secondary | ICD-10-CM | POA: Diagnosis not present

## 2018-03-13 DIAGNOSIS — Z9481 Bone marrow transplant status: Secondary | ICD-10-CM | POA: Diagnosis not present

## 2018-03-13 LAB — CMP (CANCER CENTER ONLY)
ALT: 16 U/L (ref 0–44)
AST: 18 U/L (ref 15–41)
Albumin: 3.3 g/dL — ABNORMAL LOW (ref 3.5–5.0)
Alkaline Phosphatase: 68 U/L (ref 38–126)
Anion gap: 9 (ref 5–15)
BUN: 16 mg/dL (ref 8–23)
CO2: 27 mmol/L (ref 22–32)
Calcium: 8.8 mg/dL — ABNORMAL LOW (ref 8.9–10.3)
Chloride: 108 mmol/L (ref 98–111)
Creatinine: 0.79 mg/dL (ref 0.44–1.00)
GFR, Est AFR Am: >60 mL/min (ref >60)
GFR, Estimated: >60 mL/min (ref >60)
Glucose, Bld: 87 mg/dL (ref 70–99)
Potassium: 3.2 mmol/L — ABNORMAL LOW (ref 3.5–5.1)
Sodium: 144 mmol/L (ref 135–145)
Total Bilirubin: 0.6 mg/dL (ref 0.3–1.2)
Total Protein: 5.9 g/dL — ABNORMAL LOW (ref 6.5–8.1)

## 2018-03-13 LAB — CBC WITH DIFFERENTIAL (CANCER CENTER ONLY)
Basophils Absolute: 0 10*3/uL (ref 0.0–0.1)
Basophils Relative: 0 %
Eosinophils Absolute: 0.1 10*3/uL (ref 0.0–0.5)
Eosinophils Relative: 2 %
HCT: 37 % (ref 34.8–46.6)
Hemoglobin: 12.6 g/dL (ref 11.6–15.9)
Lymphocytes Relative: 27 %
Lymphs Abs: 1.9 10*3/uL (ref 0.9–3.3)
MCH: 34.7 pg — ABNORMAL HIGH (ref 25.1–34.0)
MCHC: 34 g/dL (ref 31.5–36.0)
MCV: 102.1 fL — ABNORMAL HIGH (ref 79.5–101.0)
Monocytes Absolute: 0.6 10*3/uL (ref 0.1–0.9)
Monocytes Relative: 8 %
Neutro Abs: 4.3 10*3/uL (ref 1.5–6.5)
Neutrophils Relative %: 63 %
Platelet Count: 201 10*3/uL (ref 145–400)
RBC: 3.62 MIL/uL — ABNORMAL LOW (ref 3.70–5.45)
RDW: 12.5 % (ref 11.2–14.5)
WBC Count: 6.9 10*3/uL (ref 3.9–10.3)

## 2018-03-13 MED ORDER — SODIUM CHLORIDE 0.9 % IV SOLN
16.0000 mg/kg | Freq: Once | INTRAVENOUS | Status: AC
  Administered 2018-03-13: 1200 mg via INTRAVENOUS
  Filled 2018-03-13: qty 60

## 2018-03-13 MED ORDER — SODIUM CHLORIDE 0.9 % IV SOLN
20.0000 mg | Freq: Once | INTRAVENOUS | Status: AC
  Administered 2018-03-13: 20 mg via INTRAVENOUS
  Filled 2018-03-13: qty 2

## 2018-03-13 MED ORDER — DIPHENHYDRAMINE HCL 25 MG PO CAPS
50.0000 mg | ORAL_CAPSULE | Freq: Once | ORAL | Status: AC
  Administered 2018-03-13: 50 mg via ORAL

## 2018-03-13 MED ORDER — DIPHENHYDRAMINE HCL 25 MG PO CAPS
ORAL_CAPSULE | ORAL | Status: AC
  Filled 2018-03-13: qty 2

## 2018-03-13 MED ORDER — HEPARIN SOD (PORK) LOCK FLUSH 100 UNIT/ML IV SOLN
500.0000 [IU] | Freq: Once | INTRAVENOUS | Status: AC | PRN
  Administered 2018-03-13: 500 [IU]
  Filled 2018-03-13: qty 5

## 2018-03-13 MED ORDER — PROCHLORPERAZINE MALEATE 10 MG PO TABS
ORAL_TABLET | ORAL | Status: AC
  Filled 2018-03-13: qty 1

## 2018-03-13 MED ORDER — ACETAMINOPHEN 325 MG PO TABS
ORAL_TABLET | ORAL | Status: AC
  Filled 2018-03-13: qty 2

## 2018-03-13 MED ORDER — PROCHLORPERAZINE MALEATE 10 MG PO TABS
10.0000 mg | ORAL_TABLET | Freq: Once | ORAL | Status: AC
  Administered 2018-03-13: 10 mg via ORAL

## 2018-03-13 MED ORDER — ACETAMINOPHEN 325 MG PO TABS
650.0000 mg | ORAL_TABLET | Freq: Once | ORAL | Status: AC
  Administered 2018-03-13: 650 mg via ORAL

## 2018-03-13 MED ORDER — SODIUM CHLORIDE 0.9% FLUSH
10.0000 mL | Freq: Once | INTRAVENOUS | Status: AC
  Administered 2018-03-13: 10 mL
  Filled 2018-03-13: qty 10

## 2018-03-13 MED ORDER — SODIUM CHLORIDE 0.9% FLUSH
10.0000 mL | INTRAVENOUS | Status: DC | PRN
  Administered 2018-03-13: 10 mL
  Filled 2018-03-13: qty 10

## 2018-03-13 MED ORDER — SODIUM CHLORIDE 0.9 % IV SOLN
Freq: Once | INTRAVENOUS | Status: AC
  Administered 2018-03-13: 09:00:00 via INTRAVENOUS
  Filled 2018-03-13: qty 250

## 2018-03-13 NOTE — Progress Notes (Signed)
[place] Spiritual Care Note  Followed up with Beverlee Nims in infusion as planned. She is finding good support through a group at [facility] and has a rotation of 5-6 friends who take her to tx and sit with her during chemo for support. Aside from her fever incident last week and the disappointment of having to be in treatment again, Cleva appears to be in good spirits and coping well. She values Spiritual Care check-ins for support and encouragement. We plan to keep in touch when she is on campus, but please also page if needs arise or circumstances change. Thank you.   [place], [place], [facility] Pager [phone] Voicemail [phone]

## 2018-03-13 NOTE — Patient Instructions (Signed)
[facility] Discharge Instructions for Patients Receiving Chemotherapy  Today you received the following chemotherapy agents: Darzalex  To help prevent nausea and vomiting after your treatment, we encourage you to take your nausea medication as directed.    If you develop nausea and vomiting that is not controlled by your nausea medication, call the clinic.   BELOW ARE SYMPTOMS THAT SHOULD BE REPORTED IMMEDIATELY:  *FEVER GREATER THAN 100.5 F  *CHILLS WITH OR WITHOUT FEVER  NAUSEA AND VOMITING THAT IS NOT CONTROLLED WITH YOUR NAUSEA MEDICATION  *UNUSUAL SHORTNESS OF BREATH  *UNUSUAL BRUISING OR BLEEDING  TENDERNESS IN MOUTH AND THROAT WITH OR WITHOUT PRESENCE OF ULCERS  *URINARY PROBLEMS  *BOWEL PROBLEMS  UNUSUAL RASH Items with * indicate a potential emergency and should be followed up as soon as possible.  Feel free to call the clinic should you have any questions or concerns. The clinic phone number is [phone].  Please show the CHEMO ALERT CARD at check-in to the Emergency Department and triage nurse.   

## 2018-03-14 LAB — CULTURE, BLOOD (ROUTINE X 2)
Culture: NO GROWTH
Culture: NO GROWTH
Special Requests: ADEQUATE
Special Requests: ADEQUATE

## 2018-03-19 ENCOUNTER — Other Ambulatory Visit: Payer: Self-pay | Admitting: Cardiovascular Disease

## 2018-03-20 ENCOUNTER — Inpatient Hospital Stay: Payer: Medicare HMO

## 2018-03-20 ENCOUNTER — Encounter: Payer: Self-pay | Admitting: Hematology and Oncology

## 2018-03-20 ENCOUNTER — Inpatient Hospital Stay: Payer: Medicare HMO | Admitting: Hematology and Oncology

## 2018-03-20 ENCOUNTER — Other Ambulatory Visit: Payer: Medicare HMO

## 2018-03-20 VITALS — BP 122/66 | HR 76 | Temp 98.4°F | Resp 17

## 2018-03-20 DIAGNOSIS — Z79899 Other long term (current) drug therapy: Secondary | ICD-10-CM | POA: Diagnosis not present

## 2018-03-20 DIAGNOSIS — C9002 Multiple myeloma in relapse: Secondary | ICD-10-CM

## 2018-03-20 DIAGNOSIS — D469 Myelodysplastic syndrome, unspecified: Secondary | ICD-10-CM

## 2018-03-20 DIAGNOSIS — Z9481 Bone marrow transplant status: Secondary | ICD-10-CM | POA: Diagnosis not present

## 2018-03-20 DIAGNOSIS — Z5112 Encounter for antineoplastic immunotherapy: Secondary | ICD-10-CM | POA: Diagnosis not present

## 2018-03-20 DIAGNOSIS — Z9221 Personal history of antineoplastic chemotherapy: Secondary | ICD-10-CM | POA: Diagnosis not present

## 2018-03-20 DIAGNOSIS — R509 Fever, unspecified: Secondary | ICD-10-CM

## 2018-03-20 DIAGNOSIS — M8580 Other specified disorders of bone density and structure, unspecified site: Secondary | ICD-10-CM

## 2018-03-20 DIAGNOSIS — Z7982 Long term (current) use of aspirin: Secondary | ICD-10-CM | POA: Diagnosis not present

## 2018-03-20 LAB — CBC WITH DIFFERENTIAL (CANCER CENTER ONLY)
Basophils Absolute: 0 10*3/uL (ref 0.0–0.1)
Basophils Relative: 0 %
Eosinophils Absolute: 0 10*3/uL (ref 0.0–0.5)
Eosinophils Relative: 0 %
HCT: 37.6 % (ref 34.8–46.6)
Hemoglobin: 12.5 g/dL (ref 11.6–15.9)
Lymphocytes Relative: 36 %
Lymphs Abs: 2.4 10*3/uL (ref 0.9–3.3)
MCH: 34.3 pg — ABNORMAL HIGH (ref 25.1–34.0)
MCHC: 33.2 g/dL (ref 31.5–36.0)
MCV: 103.3 fL — ABNORMAL HIGH (ref 79.5–101.0)
Monocytes Absolute: 0.9 10*3/uL (ref 0.1–0.9)
Monocytes Relative: 14 %
Neutro Abs: 3.3 10*3/uL (ref 1.5–6.5)
Neutrophils Relative %: 50 %
Platelet Count: 164 10*3/uL (ref 145–400)
RBC: 3.64 MIL/uL — ABNORMAL LOW (ref 3.70–5.45)
RDW: 12.7 % (ref 11.2–14.5)
WBC Count: 6.6 10*3/uL (ref 3.9–10.3)

## 2018-03-20 LAB — CMP (CANCER CENTER ONLY)
ALT: 18 U/L (ref 0–44)
AST: 13 U/L — ABNORMAL LOW (ref 15–41)
Albumin: 3.5 g/dL (ref 3.5–5.0)
Alkaline Phosphatase: 73 U/L (ref 38–126)
Anion gap: 12 (ref 5–15)
BUN: 21 mg/dL (ref 8–23)
CO2: 25 mmol/L (ref 22–32)
Calcium: 8.7 mg/dL — ABNORMAL LOW (ref 8.9–10.3)
Chloride: 106 mmol/L (ref 98–111)
Creatinine: 0.74 mg/dL (ref 0.44–1.00)
GFR, Est AFR Am: >60 mL/min (ref >60)
GFR, Estimated: >60 mL/min (ref >60)
Glucose, Bld: 81 mg/dL (ref 70–99)
Potassium: 3.3 mmol/L — ABNORMAL LOW (ref 3.5–5.1)
Sodium: 143 mmol/L (ref 135–145)
Total Bilirubin: 0.2 mg/dL — ABNORMAL LOW (ref 0.3–1.2)
Total Protein: 6.2 g/dL — ABNORMAL LOW (ref 6.5–8.1)

## 2018-03-20 MED ORDER — HEPARIN SOD (PORK) LOCK FLUSH 100 UNIT/ML IV SOLN
500.0000 [IU] | Freq: Once | INTRAVENOUS | Status: AC | PRN
  Administered 2018-03-20: 500 [IU]
  Filled 2018-03-20: qty 5

## 2018-03-20 MED ORDER — SODIUM CHLORIDE 0.9% FLUSH
10.0000 mL | INTRAVENOUS | Status: DC | PRN
  Administered 2018-03-20: 10 mL
  Filled 2018-03-20: qty 10

## 2018-03-20 MED ORDER — SODIUM CHLORIDE 0.9 % IV SOLN
16.0000 mg/kg | Freq: Once | INTRAVENOUS | Status: AC
  Administered 2018-03-20: 1200 mg via INTRAVENOUS
  Filled 2018-03-20: qty 60

## 2018-03-20 MED ORDER — DEXAMETHASONE 4 MG PO TABS: ORAL_TABLET | ORAL | 9 refills | Status: DC

## 2018-03-20 MED ORDER — DIPHENHYDRAMINE HCL 25 MG PO CAPS
50.0000 mg | ORAL_CAPSULE | Freq: Once | ORAL | Status: AC
  Administered 2018-03-20: 50 mg via ORAL

## 2018-03-20 MED ORDER — SODIUM CHLORIDE 0.9 % IV SOLN
20.0000 mg | Freq: Once | INTRAVENOUS | Status: AC
  Administered 2018-03-20: 20 mg via INTRAVENOUS
  Filled 2018-03-20: qty 2

## 2018-03-20 MED ORDER — ACYCLOVIR 400 MG PO TABS: 400.0000 mg | ORAL_TABLET | Freq: Two times a day (BID) | ORAL | 11 refills | Status: DC

## 2018-03-20 MED ORDER — DIPHENHYDRAMINE HCL 25 MG PO CAPS
ORAL_CAPSULE | ORAL | Status: AC
  Filled 2018-03-20: qty 2

## 2018-03-20 MED ORDER — PROCHLORPERAZINE MALEATE 10 MG PO TABS
ORAL_TABLET | ORAL | Status: AC
  Filled 2018-03-20: qty 1

## 2018-03-20 MED ORDER — ACETAMINOPHEN 325 MG PO TABS
650.0000 mg | ORAL_TABLET | Freq: Once | ORAL | Status: AC
  Administered 2018-03-20: 650 mg via ORAL

## 2018-03-20 MED ORDER — SODIUM CHLORIDE 0.9 % IV SOLN
Freq: Once | INTRAVENOUS | Status: AC
  Administered 2018-03-20: 09:00:00 via INTRAVENOUS
  Filled 2018-03-20: qty 250

## 2018-03-20 MED ORDER — PROCHLORPERAZINE MALEATE 10 MG PO TABS
10.0000 mg | ORAL_TABLET | Freq: Once | ORAL | Status: AC
  Administered 2018-03-20: 10 mg via ORAL

## 2018-03-20 MED ORDER — ACETAMINOPHEN 325 MG PO TABS
ORAL_TABLET | ORAL | Status: AC
  Filled 2018-03-20: qty 2

## 2018-03-20 NOTE — Patient Instructions (Signed)
[facility] Discharge Instructions for Patients Receiving Chemotherapy  Today you received the following chemotherapy agents: Daratumumab (Darzalex)   To help prevent nausea and vomiting after your treatment, we encourage you to take your nausea medication  as prescribed.    If you develop nausea and vomiting that is not controlled by your nausea medication, call the clinic.   BELOW ARE SYMPTOMS THAT SHOULD BE REPORTED IMMEDIATELY:  *FEVER GREATER THAN 100.5 F  *CHILLS WITH OR WITHOUT FEVER  NAUSEA AND VOMITING THAT IS NOT CONTROLLED WITH YOUR NAUSEA MEDICATION  *UNUSUAL SHORTNESS OF BREATH  *UNUSUAL BRUISING OR BLEEDING  TENDERNESS IN MOUTH AND THROAT WITH OR WITHOUT PRESENCE OF ULCERS  *URINARY PROBLEMS  *BOWEL PROBLEMS  UNUSUAL RASH Items with * indicate a potential emergency and should be followed up as soon as possible.  Feel free to call the clinic should you have any questions or concerns. The clinic phone number is [phone].  Please show the CHEMO ALERT CARD at check-in to the Emergency Department and triage nurse.   

## 2018-03-20 NOTE — Assessment & Plan Note (Signed)
So far, she tolerated treatment well except for some low-grade fever and chills which I think is immune reaction She does not need to be on prophylactic antibiotic therapy I recommend minimum 4 doses of treatment before repeat myeloma panel I refilled her prescription of dexamethasone and acyclovir She will continue calcium with vitamin D Once she stopped having fevers, I will institute the addition of Zometa She is reminded to take aspirin to prevent DVT while on Pomalyst I will see her back again next month for further follow-up

## 2018-03-20 NOTE — Assessment & Plan Note (Signed)
She has mild fever and chills likely due to immune response Her previous work-up was negative I reassured the patient.

## 2018-03-20 NOTE — Assessment & Plan Note (Signed)
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

## 2018-03-20 NOTE — Progress Notes (Signed)
[place] OFFICE PROGRESS NOTE  Patient Care Team: [name], [name], MD as PCP - General (Internal Medicine) [name], MD as Referring Physician (Hematology and Oncology) [name], MD as Consulting Physician (Internal Medicine) [name], [name], MD as Consulting Physician (Infectious Diseases) [name], MD as Consulting Physician (Hematology and Oncology) [name], NP as Nurse Practitioner (Hematology and Oncology)  ASSESSMENT & PLAN:  Multiple myeloma [facility]) So far, she tolerated treatment well except for some low-grade fever and chills which I think is immune reaction She does not need to be on prophylactic antibiotic therapy I recommend minimum 4 doses of treatment before repeat myeloma panel I refilled her prescription of dexamethasone and acyclovir She will continue calcium with vitamin D Once she stopped having fevers, I will institute the addition of Zometa She is reminded to take aspirin to prevent DVT while on Pomalyst I will see her back again next month for further follow-up  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   Orders Placed This Encounter  Procedures  . Multiple Myeloma Panel (SPEP&IFE w/QIG)    Standing Status:   Future    Standing Expiration Date:   04/24/2019  . Kappa/lambda light chains    Standing Status:   Future    Standing Expiration Date:   04/24/2019  . Beta 2 microglobulin, serum    Standing Status:   Future    Standing Expiration Date:   04/24/2019    INTERVAL HISTORY: Please see below for problem oriented charting. She is seen in the infusion room for further follow-up She continues to have mild  intermittent low-grade fever and chills after treatment No localizing signs such as dysuria, frequency, urgency or cough Appetite is stable.  She complained of fatigue. No new bone pain.  SUMMARY OF ONCOLOGIC HISTORY: Oncology History   Multiple myeloma, Ig A Lambda, M spike 3.54 grams, Calcium 9.2, Creatinine 0.8, Beta 2 microglobulin 4.52, IgA 4840 mg/dL, lambda light chain 75.4, albumin 3.6, hemoglobin 9.7, platelet 115    Primary site: Multiple Myeloma   Staging method: AJCC 6th Edition   Clinical: Stage IIA signed by [name], MD on 11/07/2013  2:46 PM   Summary: Stage IIA        Multiple myeloma ([place])   10/31/2013 Bone Marrow Biopsy    Bone marrow biopsy confirmed multiple myeloma with 40% bone marrow involvement. Skeletal survey showed minimal lesions in her score with generalized demineralization    11/10/2013 - 02/13/2014 Chemotherapy    The patient is started on induction chemotherapy with weekly dexamethasone 40 mg by mouth as well as Velcade subcutaneous injection on days 1, 4, 8 and 11. On 11/21/2013, she was started on monthly Zometa.    12/23/2013 Adverse Reaction    The dose of Velcade was reduced due to thrombocytopenia.    01/28/2014 - 04/07/2014 Chemotherapy    Revlimid is added. Treatment was discontinued due to lack of response.    02/24/2014 - 04/07/2014 Chemotherapy    Due to worsening peripheral neuropathy, Velcade injection is changed to once a week. Revlimid was given 21 days on, 7 days off.    04/07/2014 - 04/10/2014 Chemotherapy    Revlimid was discontinued due to lack of response. Chemotherapy was  changed back to Velcade injection twice a week, 2 weeks on 1 week off. Her treatment was switched to to minimum response    04/20/2014 - 06/02/2014 Chemotherapy    chemotherapy is switched to Carfilzomib, Cytoxan and dexamethasone.    04/22/2014 Procedure    she has placement of port for chemotherapy.    06/01/2014 Tumor Marker    Bloodwork show that she has  greater than partial response    06/23/2014 Bone Marrow Biopsy    Bone marrow biopsy show 5-10% residual plasma cells, normal cytogenetics and FISH    07/07/2014 Procedure    She had stem cell collection    07/22/2014 - 07/22/2014 Chemotherapy    She had high-dose chemotherapy with melphalan    07/23/2014 Bone Marrow Transplant    She had bone marrow transplant in autologous fashion at [facility]    10/20/2014 - 03/24/2015 Chemotherapy     she received chemotherapy with Kyprolis, Revlimid and dexamethasone    10/22/2014 Procedure    She has port placement    01/19/2015 Tumor Marker    IgA lambda M spike at 0.4 g     01/20/2015 Miscellaneous    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 No cource was found and fever resolved with IV vancomycin and meropenem    09/13/2015 Bone Marrow Biopsy    Bone marrow biopsy showed no increased blasts, 3-4 % plasma cells    03/02/2016 Bone Marrow Biopsy    Bone marrow biopsy at [facility] showed normocellular (30%) bone marrow with trilineage hematopoiesis. No significant increase in blasts. No significant increase in plasma cells.    05/12/2016 Imaging    DEXA scan at [place] showed osteopenia    10/24/2016 Imaging    Skeletal survey at [facility], no new lesions    12/07/2017 Imaging    No focal abnormality noted to suggest myeloma. Exam is stable from prior exam.    02/20/2018 -  Chemotherapy    The patient had daratumumab (DARZALEX) 1,200 mg in sodium chloride 0.9 % 940 mL (1.2 mg/mL) chemo infusion, 16 mg/kg =  1,200 mg, Intravenous, Once, 1 of 1 cycle Administration: 1,200 mg (03/06/2018) daratumumab (DARZALEX) 1,200 mg in sodium chloride 0.9 % 440 mL (2.4 mg/mL) chemo infusion, 16 mg/kg = 1,200 mg, Intravenous, Once, 1 of 7 cycles Administration: 1,200 mg (03/13/2018), 1,200 mg (03/20/2018)  for chemotherapy treatment.     03/01/2018 Procedure    Successful 8 French right internal jugular vein power port placement with its tip at the SVC/RA junction.     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    10/31/2015 Miscellaneous    She has engrafted     REVIEW OF SYSTEMS:   Eyes: Denies blurriness of vision Ears, nose, mouth, throat, and face: Denies mucositis or sore throat Respiratory: Denies cough, dyspnea or wheezes Cardiovascular: Denies palpitation, chest discomfort or lower extremity swelling Gastrointestinal:  Denies nausea, heartburn or change in bowel habits Skin: Denies abnormal skin rashes Lymphatics: Denies new lymphadenopathy or easy bruising Neurological:Denies numbness, tingling or new weaknesses Behavioral/Psych: Mood is stable, no new changes  All other systems were reviewed with the patient and are negative.  I have reviewed the past medical history, past surgical history, social history and family history with the patient and they are unchanged from previous note.  ALLERGIES:  has  No Known Allergies.  MEDICATIONS:  Current Outpatient Medications  Medication Sig Dispense Refill  . acyclovir (ZOVIRAX) 400 MG tablet Take 1 tablet (400 mg total) by mouth 2 (two) times daily. 180 tablet 11  . calcium carbonate (TUMS - DOSED IN MG ELEMENTAL CALCIUM) 500 MG chewable tablet Chew 1 tablet by mouth daily.    . carvedilol (COREG) 3.125 MG tablet TAKE 1 TABLET (3.125 MG TOTAL) BY MOUTH 2 (TWO) TIMES DAILY WITH A MEAL. 180 tablet 3  . Cholecalciferol (VITAMIN D-1000 MAX ST) 1000 units tablet Take 1,000 Units by mouth daily.    Marland Kitchen dexamethasone (DECADRON) 4 MG tablet Take weekly 20 mg with food 60 tablet 9  . docusate sodium (COLACE) 100 MG capsule Take 100 mg by mouth daily as needed for mild constipation.     . lidocaine-prilocaine (EMLA) cream Apply to affected area once 30 g 3  . loperamide (IMODIUM) 2 MG capsule Take by mouth as needed for diarrhea or loose stools. As needed only    . mirtazapine (REMERON) 30 MG tablet Take 1 tablet by mouth at bedtime.    . Multiple Vitamin (MULTIVITAMIN WITH MINERALS) TABS tablet Take 1 tablet by mouth daily.    . Multiple Vitamins-Minerals (ICAPS AREDS 2) CAPS Take 1 capsule by mouth 2 (two) times daily.    . ondansetron (ZOFRAN) 8 MG tablet Take 1 tablet (8 mg total) by mouth every 8 (eight) hours as needed (Nausea or vomiting). 30 tablet 1  . pantoprazole (PROTONIX) 40 MG tablet TAKE 1 TABLET (40 MG TOTAL) BY MOUTH DAILY BEFORE BREAKFAST. 90 tablet 3  . pomalidomide (POMALYST) 2 MG capsule Take 1 capsule (2 mg total) by mouth daily. Take with water on days 1-21. Repeat every 28 days. 21 capsule 0  . prochlorperazine (COMPAZINE) 10 MG tablet Take 1 tablet (10 mg total) by mouth every 6 (six) hours as needed (Nausea or vomiting). 30 tablet 1   No current facility-administered medications for this visit.    Facility-Administered Medications Ordered in Other Visits  Medication Dose Route Frequency Provider Last Rate Last Dose  . sodium  chloride flush (NS) 0.9 % injection 10 mL  10 mL Intracatheter PRN Alvy Bimler, Bunnie Rehberg, MD   10 mL at 03/20/18 1448    PHYSICAL EXAMINATION: ECOG PERFORMANCE STATUS: 1 - Symptomatic but completely ambulatory GENERAL:alert, no distress and comfortable SKIN: skin color, texture, turgor are normal, no rashes or significant lesions EYES: normal, Conjunctiva are pink and non-injected, sclera clear OROPHARYNX:no exudate, no erythema and lips, buccal mucosa, and tongue normal  NECK: supple, thyroid normal size, non-tender, without nodularity LYMPH:  no  palpable lymphadenopathy in the cervical, axillary or inguinal LUNGS: clear to auscultation and percussion with normal breathing effort HEART: regular rate & rhythm and no murmurs and no lower extremity edema ABDOMEN:abdomen soft, non-tender and normal bowel sounds Musculoskeletal:no cyanosis of digits and no clubbing  NEURO: alert & oriented x 3 with fluent speech, no focal motor/sensory deficits  LABORATORY DATA:  I have reviewed the data as listed    Component Value Date/Time   NA 143 03/20/2018 0803   NA 142 01/04/2017 0902   K 3.3 (L) 03/20/2018 0803   K 3.9 01/04/2017 0902   CL 106 03/20/2018 0803   CO2 25 03/20/2018 0803   CO2 28 01/04/2017 0902   GLUCOSE 81 03/20/2018 0803   GLUCOSE 92 01/04/2017 0902   BUN 21 03/20/2018 0803   BUN 9.5 01/04/2017 0902   CREATININE 0.74 03/20/2018 0803   CREATININE 0.7 01/04/2017 0902   CALCIUM 8.7 (L) 03/20/2018 0803   CALCIUM 9.0 01/04/2017 0902   PROT 6.2 (L) 03/20/2018 0803   PROT 6.0 (L) 01/04/2017 0902   PROT 5.8 (L) 01/04/2017 0902   ALBUMIN 3.5 03/20/2018 0803   ALBUMIN 3.4 (L) 01/04/2017 0902   AST 13 (L) 03/20/2018 0803   AST 21 01/04/2017 0902   ALT 18 03/20/2018 0803   ALT 16 01/04/2017 0902   ALKPHOS 73 03/20/2018 0803   ALKPHOS 101 01/04/2017 0902   BILITOT 0.2 (L) 03/20/2018 0803   BILITOT 0.56 01/04/2017 0902   GFRNONAA >60 03/20/2018 0803   GFRAA >60 03/20/2018 0803    No  results found for: SPEP, UPEP  Lab Results  Component Value Date   WBC 6.6 03/20/2018   NEUTROABS 3.3 03/20/2018   HGB 12.5 03/20/2018   HCT 37.6 03/20/2018   MCV 103.3 (H) 03/20/2018   PLT 164 03/20/2018      Chemistry      Component Value Date/Time   NA 143 03/20/2018 0803   NA 142 01/04/2017 0902   K 3.3 (L) 03/20/2018 0803   K 3.9 01/04/2017 0902   CL 106 03/20/2018 0803   CO2 25 03/20/2018 0803   CO2 28 01/04/2017 0902   BUN 21 03/20/2018 0803   BUN 9.5 01/04/2017 0902   CREATININE 0.74 03/20/2018 0803   CREATININE 0.7 01/04/2017 0902      Component Value Date/Time   CALCIUM 8.7 (L) 03/20/2018 0803   CALCIUM 9.0 01/04/2017 0902   ALKPHOS 73 03/20/2018 0803   ALKPHOS 101 01/04/2017 0902   AST 13 (L) 03/20/2018 0803   AST 21 01/04/2017 0902   ALT 18 03/20/2018 0803   ALT 16 01/04/2017 0902   BILITOT 0.2 (L) 03/20/2018 0803   BILITOT 0.56 01/04/2017 0902       RADIOGRAPHIC STUDIES: I have personally reviewed the radiological images as listed and agreed with the findings in the report. Dg Chest 2 View  Result Date: 03/09/2018 CLINICAL DATA:  Fever, nausea and vomiting.  Active chemotherapy. EXAM: CHEST - 2 VIEW COMPARISON:  Radiograph '6 14 19 ' FINDINGS: Right chest port with tip in the mid SVC. Unchanged heart size and mediastinal contours. Chronic mild elevation of right hemidiaphragm. Calcified granuloma in the right lung. No focal airspace disease, pleural effusion or pneumothorax. Reverse right shoulder arthroplasty. IMPRESSION: No acute findings. Electronically Signed   By: Keith Rake M.D.   On: 03/09/2018 06:46   [facility] Guided Port Insertion  Result Date: 03/01/2018 CLINICAL DATA:  Multiple myeloma EXAM: TUNNEL POWER PORT PLACEMENT WITH SUBCUTANEOUS POCKET  UTILIZING ULTRASOUND & FLOUROSCOPY FLUOROSCOPY TIME:  30 seconds.  1.6 mGy. MEDICATIONS AND MEDICAL HISTORY: Versed 2 mg, Fentanyl 100 mcg. Additional Medications: 2 g Ancef. Antibiotics were given  within 2 hours of the procedure. ANESTHESIA/SEDATION: Moderate sedation time: 29 minutes. Nursing monitored the the patient during the procedure. PROCEDURE: After written informed consent was obtained, patient was placed in the supine position on angiographic table. The right neck and chest was prepped and draped in a sterile fashion. Lidocaine was utilized for local anesthesia. The right jugular vein was noted to be patent initially with ultrasound. Under sonographic guidance, a micropuncture needle was inserted into the right IJ vein (Ultrasound and fluoroscopic image documentation was performed). The needle was removed over an 018 wire which was exchanged for a Amplatz. This was advanced into the IVC. An 8-French dilator was advanced over the Amplatz. A small incision was made in the right upper chest over the anterior right second rib. Utilizing blunt dissection, a subcutaneous pocket was created in the caudal direction. The pocket was irrigated with a copious amount of sterile normal saline. The port catheter was tunneled from the chest incision, and out the neck incision. The reservoir was inserted into the subcutaneous pocket and secured with two 3-0 Ethilon stitches. A peel-away sheath was advanced over the Amplatz wire. The port catheter was cut to measure length and inserted through the peel-away sheath. The peel-away sheath was removed. The chest incision was closed with 3-0 Vicryl interrupted stitches for the subcutaneous tissue and a running of 4-0 Vicryl subcuticular stitch for the skin. The neck incision was closed with a 4-0 Vicryl subcuticular stitch. Derma-bond was applied to both surgical incisions. The port reservoir was flushed and instilled with heparinized saline. No complications. FINDINGS: A right IJ vein Port-A-Cath is in place with its tip at the cavoatrial junction. COMPLICATIONS: None IMPRESSION: Successful 8 French right internal jugular vein power port placement with its tip at the  SVC/RA junction. Electronically Signed   By: Marybelle Killings M.D.   On: 03/01/2018 13:08    All questions were answered. The patient knows to call the clinic with any problems, questions or concerns. No barriers to learning was detected.  I spent 15 minutes counseling the patient face to face. The total time spent in the appointment was 20 minutes and more than 50% was on counseling and review of test results  [name], MD 03/20/2018 3:15 PM

## 2018-03-26 ENCOUNTER — Other Ambulatory Visit: Payer: Self-pay

## 2018-03-26 DIAGNOSIS — C9002 Multiple myeloma in relapse: Secondary | ICD-10-CM

## 2018-03-26 MED ORDER — POMALIDOMIDE 2 MG PO CAPS: 2.0000 mg | ORAL_CAPSULE | Freq: Every day | ORAL | 0 refills | Status: DC

## 2018-03-28 ENCOUNTER — Inpatient Hospital Stay: Payer: Medicare HMO | Attending: Hematology and Oncology

## 2018-03-28 ENCOUNTER — Inpatient Hospital Stay: Payer: Medicare HMO

## 2018-03-28 VITALS — BP 116/65 | HR 61 | Temp 98.4°F | Resp 16

## 2018-03-28 DIAGNOSIS — C9002 Multiple myeloma in relapse: Secondary | ICD-10-CM

## 2018-03-28 DIAGNOSIS — Z23 Encounter for immunization: Secondary | ICD-10-CM | POA: Insufficient documentation

## 2018-03-28 DIAGNOSIS — H9313 Tinnitus, bilateral: Secondary | ICD-10-CM | POA: Insufficient documentation

## 2018-03-28 DIAGNOSIS — Z95828 Presence of other vascular implants and grafts: Secondary | ICD-10-CM

## 2018-03-28 DIAGNOSIS — D61818 Other pancytopenia: Secondary | ICD-10-CM | POA: Insufficient documentation

## 2018-03-28 DIAGNOSIS — Z5112 Encounter for antineoplastic immunotherapy: Secondary | ICD-10-CM | POA: Diagnosis present

## 2018-03-28 LAB — CMP (CANCER CENTER ONLY)
ALT: 16 U/L (ref 0–44)
AST: 14 U/L — ABNORMAL LOW (ref 15–41)
Albumin: 3.5 g/dL (ref 3.5–5.0)
Alkaline Phosphatase: 70 U/L (ref 38–126)
Anion gap: 11 (ref 5–15)
BUN: 17 mg/dL (ref 8–23)
CO2: 25 mmol/L (ref 22–32)
Calcium: 9.5 mg/dL (ref 8.9–10.3)
Chloride: 104 mmol/L (ref 98–111)
Creatinine: 0.73 mg/dL (ref 0.44–1.00)
GFR, Est AFR Am: >60 mL/min (ref >60)
GFR, Estimated: >60 mL/min (ref >60)
Glucose, Bld: 95 mg/dL (ref 70–99)
Potassium: 3.9 mmol/L (ref 3.5–5.1)
Sodium: 140 mmol/L (ref 135–145)
Total Bilirubin: 0.4 mg/dL (ref 0.3–1.2)
Total Protein: 6.2 g/dL — ABNORMAL LOW (ref 6.5–8.1)

## 2018-03-28 LAB — CBC WITH DIFFERENTIAL (CANCER CENTER ONLY)
Basophils Absolute: 0 10*3/uL (ref 0.0–0.1)
Basophils Relative: 0 %
Eosinophils Absolute: 0 10*3/uL (ref 0.0–0.5)
Eosinophils Relative: 0 %
HCT: 36 % (ref 34.8–46.6)
Hemoglobin: 12.4 g/dL (ref 11.6–15.9)
Lymphocytes Relative: 64 %
Lymphs Abs: 3 10*3/uL (ref 0.9–3.3)
MCH: 35.6 pg — ABNORMAL HIGH (ref 25.1–34.0)
MCHC: 34.6 g/dL (ref 31.5–36.0)
MCV: 103 fL — ABNORMAL HIGH (ref 79.5–101.0)
Monocytes Absolute: 1 10*3/uL — ABNORMAL HIGH (ref 0.1–0.9)
Monocytes Relative: 21 %
Neutro Abs: 0.7 10*3/uL — ABNORMAL LOW (ref 1.5–6.5)
Neutrophils Relative %: 15 %
Platelet Count: 171 10*3/uL (ref 145–400)
RBC: 3.49 MIL/uL — ABNORMAL LOW (ref 3.70–5.45)
RDW: 13.2 % (ref 11.2–14.5)
WBC Count: 4.7 10*3/uL (ref 3.9–10.3)

## 2018-03-28 MED ORDER — SODIUM CHLORIDE 0.9% FLUSH
10.0000 mL | INTRAVENOUS | Status: DC | PRN
  Administered 2018-03-28: 10 mL via INTRAVENOUS
  Filled 2018-03-28: qty 10

## 2018-03-28 MED ORDER — PROCHLORPERAZINE MALEATE 10 MG PO TABS
ORAL_TABLET | ORAL | Status: AC
  Filled 2018-03-28: qty 1

## 2018-03-28 MED ORDER — SODIUM CHLORIDE 0.9 % IV SOLN: 16.0000 mg/kg | Freq: Once | INTRAVENOUS | Status: DC

## 2018-03-28 MED ORDER — SODIUM CHLORIDE 0.9 % IV SOLN
16.0000 mg/kg | Freq: Once | INTRAVENOUS | Status: AC
  Administered 2018-03-28: 1200 mg via INTRAVENOUS
  Filled 2018-03-28: qty 60

## 2018-03-28 MED ORDER — DIPHENHYDRAMINE HCL 25 MG PO CAPS
ORAL_CAPSULE | ORAL | Status: AC
  Filled 2018-03-28: qty 2

## 2018-03-28 MED ORDER — HEPARIN SOD (PORK) LOCK FLUSH 100 UNIT/ML IV SOLN
500.0000 [IU] | Freq: Once | INTRAVENOUS | Status: AC | PRN
  Administered 2018-03-28: 500 [IU]
  Filled 2018-03-28: qty 5

## 2018-03-28 MED ORDER — SODIUM CHLORIDE 0.9 % IV SOLN
Freq: Once | INTRAVENOUS | Status: AC
  Administered 2018-03-28: 09:00:00 via INTRAVENOUS
  Filled 2018-03-28: qty 250

## 2018-03-28 MED ORDER — SODIUM CHLORIDE 0.9% FLUSH
10.0000 mL | INTRAVENOUS | Status: DC | PRN
  Administered 2018-03-28: 10 mL
  Filled 2018-03-28: qty 10

## 2018-03-28 MED ORDER — PROCHLORPERAZINE MALEATE 10 MG PO TABS
10.0000 mg | ORAL_TABLET | Freq: Once | ORAL | Status: AC
  Administered 2018-03-28: 10 mg via ORAL

## 2018-03-28 MED ORDER — DIPHENHYDRAMINE HCL 25 MG PO CAPS
50.0000 mg | ORAL_CAPSULE | Freq: Once | ORAL | Status: AC
  Administered 2018-03-28: 50 mg via ORAL

## 2018-03-28 MED ORDER — ACETAMINOPHEN 325 MG PO TABS
ORAL_TABLET | ORAL | Status: AC
  Filled 2018-03-28: qty 2

## 2018-03-28 MED ORDER — SODIUM CHLORIDE 0.9 % IV SOLN
20.0000 mg | Freq: Once | INTRAVENOUS | Status: AC
  Administered 2018-03-28: 20 mg via INTRAVENOUS
  Filled 2018-03-28: qty 2

## 2018-03-28 MED ORDER — ACETAMINOPHEN 325 MG PO TABS
650.0000 mg | ORAL_TABLET | Freq: Once | ORAL | Status: AC
  Administered 2018-03-28: 650 mg via ORAL

## 2018-03-28 NOTE — Progress Notes (Signed)
Okay to treat with ANC of 0.7 today per Dr. Alvy Bimler. Instructed to hold Pomalyst until appt next week.

## 2018-03-28 NOTE — Progress Notes (Signed)
Ok to change to Rapid Daratumumab today per Dr. Alvy Bimler.  Hardie Pulley, PharmD, BCPS, BCOP

## 2018-03-28 NOTE — Patient Instructions (Signed)
[facility] Discharge Instructions for Patients Receiving Chemotherapy  Today you received the following chemotherapy agents: Darzalex  To help prevent nausea and vomiting after your treatment, we encourage you to take your nausea medication as directed.    If you develop nausea and vomiting that is not controlled by your nausea medication, call the clinic.   BELOW ARE SYMPTOMS THAT SHOULD BE REPORTED IMMEDIATELY:  *FEVER GREATER THAN 100.5 F  *CHILLS WITH OR WITHOUT FEVER  NAUSEA AND VOMITING THAT IS NOT CONTROLLED WITH YOUR NAUSEA MEDICATION  *UNUSUAL SHORTNESS OF BREATH  *UNUSUAL BRUISING OR BLEEDING  TENDERNESS IN MOUTH AND THROAT WITH OR WITHOUT PRESENCE OF ULCERS  *URINARY PROBLEMS  *BOWEL PROBLEMS  UNUSUAL RASH Items with * indicate a potential emergency and should be followed up as soon as possible.  Feel free to call the clinic should you have any questions or concerns. The clinic phone number is [phone].  Please show the CHEMO ALERT CARD at check-in to the Emergency Department and triage nurse.   

## 2018-04-03 ENCOUNTER — Inpatient Hospital Stay: Payer: Medicare HMO

## 2018-04-03 VITALS — BP 117/73 | HR 58 | Temp 98.6°F | Resp 16

## 2018-04-03 DIAGNOSIS — Z5112 Encounter for antineoplastic immunotherapy: Secondary | ICD-10-CM | POA: Diagnosis not present

## 2018-04-03 DIAGNOSIS — C9002 Multiple myeloma in relapse: Secondary | ICD-10-CM

## 2018-04-03 DIAGNOSIS — H9313 Tinnitus, bilateral: Secondary | ICD-10-CM | POA: Diagnosis not present

## 2018-04-03 DIAGNOSIS — Z23 Encounter for immunization: Secondary | ICD-10-CM | POA: Diagnosis not present

## 2018-04-03 DIAGNOSIS — D61818 Other pancytopenia: Secondary | ICD-10-CM | POA: Diagnosis not present

## 2018-04-03 DIAGNOSIS — D469 Myelodysplastic syndrome, unspecified: Secondary | ICD-10-CM

## 2018-04-03 LAB — CBC WITH DIFFERENTIAL (CANCER CENTER ONLY)
Abs Immature Granulocytes: 0.01 10*3/uL (ref 0.00–0.07)
Basophils Absolute: 0 10*3/uL (ref 0.0–0.1)
Basophils Relative: 0 %
Eosinophils Absolute: 0 10*3/uL (ref 0.0–0.5)
Eosinophils Relative: 1 %
HCT: 38.1 % (ref 36.0–46.0)
Hemoglobin: 12.8 g/dL (ref 12.0–15.0)
Immature Granulocytes: 0 %
Lymphocytes Relative: 64 %
Lymphs Abs: 4.9 10*3/uL — ABNORMAL HIGH (ref 0.7–4.0)
MCH: 35 pg — ABNORMAL HIGH (ref 26.0–34.0)
MCHC: 33.6 g/dL (ref 30.0–36.0)
MCV: 104.1 fL — ABNORMAL HIGH (ref 80.0–100.0)
Monocytes Absolute: 1.5 10*3/uL — ABNORMAL HIGH (ref 0.1–1.0)
Monocytes Relative: 20 %
Neutro Abs: 1.1 10*3/uL — ABNORMAL LOW (ref 1.7–7.7)
Neutrophils Relative %: 15 %
Platelet Count: 225 10*3/uL (ref 150–400)
RBC: 3.66 MIL/uL — ABNORMAL LOW (ref 3.87–5.11)
RDW: 13.3 % (ref 11.5–15.5)
WBC Count: 7.6 10*3/uL (ref 4.0–10.5)
nRBC: 0 % (ref 0.0–0.2)

## 2018-04-03 LAB — CMP (CANCER CENTER ONLY)
ALT: 17 U/L (ref 0–44)
AST: 13 U/L — ABNORMAL LOW (ref 15–41)
Albumin: 3.7 g/dL (ref 3.5–5.0)
Alkaline Phosphatase: 76 U/L (ref 38–126)
Anion gap: 11 (ref 5–15)
BUN: 20 mg/dL (ref 8–23)
CO2: 27 mmol/L (ref 22–32)
Calcium: 9.2 mg/dL (ref 8.9–10.3)
Chloride: 104 mmol/L (ref 98–111)
Creatinine: 0.75 mg/dL (ref 0.44–1.00)
GFR, Est AFR Am: >60 mL/min (ref >60)
GFR, Estimated: >60 mL/min (ref >60)
Glucose, Bld: 85 mg/dL (ref 70–99)
Potassium: 3.9 mmol/L (ref 3.5–5.1)
Sodium: 142 mmol/L (ref 135–145)
Total Bilirubin: 0.3 mg/dL (ref 0.3–1.2)
Total Protein: 6.2 g/dL — ABNORMAL LOW (ref 6.5–8.1)

## 2018-04-03 MED ORDER — SODIUM CHLORIDE 0.9% FLUSH
10.0000 mL | Freq: Once | INTRAVENOUS | Status: AC
  Administered 2018-04-03: 10 mL
  Filled 2018-04-03: qty 10

## 2018-04-03 MED ORDER — SODIUM CHLORIDE 0.9 % IV SOLN
Freq: Once | INTRAVENOUS | Status: AC
  Administered 2018-04-03: 09:00:00 via INTRAVENOUS
  Filled 2018-04-03: qty 250

## 2018-04-03 MED ORDER — DIPHENHYDRAMINE HCL 25 MG PO CAPS
50.0000 mg | ORAL_CAPSULE | Freq: Once | ORAL | Status: AC
  Administered 2018-04-03: 50 mg via ORAL

## 2018-04-03 MED ORDER — ACETAMINOPHEN 325 MG PO TABS
ORAL_TABLET | ORAL | Status: AC
  Filled 2018-04-03: qty 2

## 2018-04-03 MED ORDER — PROCHLORPERAZINE MALEATE 10 MG PO TABS
10.0000 mg | ORAL_TABLET | Freq: Once | ORAL | Status: AC
  Administered 2018-04-03: 10 mg via ORAL

## 2018-04-03 MED ORDER — DIPHENHYDRAMINE HCL 25 MG PO CAPS
ORAL_CAPSULE | ORAL | Status: AC
  Filled 2018-04-03: qty 2

## 2018-04-03 MED ORDER — ZOLEDRONIC ACID 4 MG/100ML IV SOLN
4.0000 mg | Freq: Once | INTRAVENOUS | Status: AC
  Administered 2018-04-03: 4 mg via INTRAVENOUS
  Filled 2018-04-03: qty 100

## 2018-04-03 MED ORDER — SODIUM CHLORIDE 0.9% FLUSH
10.0000 mL | INTRAVENOUS | Status: DC | PRN
  Administered 2018-04-03: 10 mL
  Filled 2018-04-03: qty 10

## 2018-04-03 MED ORDER — SODIUM CHLORIDE 0.9 % IV SOLN
16.0000 mg/kg | Freq: Once | INTRAVENOUS | Status: AC
  Administered 2018-04-03: 1200 mg via INTRAVENOUS
  Filled 2018-04-03: qty 60

## 2018-04-03 MED ORDER — SODIUM CHLORIDE 0.9 % IV SOLN
20.0000 mg | Freq: Once | INTRAVENOUS | Status: AC
  Administered 2018-04-03: 20 mg via INTRAVENOUS
  Filled 2018-04-03: qty 2

## 2018-04-03 MED ORDER — ACETAMINOPHEN 325 MG PO TABS
650.0000 mg | ORAL_TABLET | Freq: Once | ORAL | Status: AC
  Administered 2018-04-03: 650 mg via ORAL

## 2018-04-03 MED ORDER — HEPARIN SOD (PORK) LOCK FLUSH 100 UNIT/ML IV SOLN
500.0000 [IU] | Freq: Once | INTRAVENOUS | Status: AC | PRN
  Administered 2018-04-03: 500 [IU]
  Filled 2018-04-03: qty 5

## 2018-04-03 MED ORDER — PROCHLORPERAZINE MALEATE 10 MG PO TABS
ORAL_TABLET | ORAL | Status: AC
  Filled 2018-04-03: qty 1

## 2018-04-03 NOTE — Progress Notes (Signed)
Ok to treat with ANC 1.1 per Dr. Alvy Bimler

## 2018-04-03 NOTE — Patient Instructions (Signed)
[facility] Discharge Instructions for Patients Receiving Chemotherapy  Today you received the following chemotherapy agents: Darzalex  To help prevent nausea and vomiting after your treatment, we encourage you to take your nausea medication as directed.    If you develop nausea and vomiting that is not controlled by your nausea medication, call the clinic.   BELOW ARE SYMPTOMS THAT SHOULD BE REPORTED IMMEDIATELY:  *FEVER GREATER THAN 100.5 F  *CHILLS WITH OR WITHOUT FEVER  NAUSEA AND VOMITING THAT IS NOT CONTROLLED WITH YOUR NAUSEA MEDICATION  *UNUSUAL SHORTNESS OF BREATH  *UNUSUAL BRUISING OR BLEEDING  TENDERNESS IN MOUTH AND THROAT WITH OR WITHOUT PRESENCE OF ULCERS  *URINARY PROBLEMS  *BOWEL PROBLEMS  UNUSUAL RASH Items with * indicate a potential emergency and should be followed up as soon as possible.  Feel free to call the clinic should you have any questions or concerns. The clinic phone number is [phone].  Please show the CHEMO ALERT CARD at check-in to the Emergency Department and triage nurse.   

## 2018-04-04 LAB — KAPPA/LAMBDA LIGHT CHAINS
Kappa free light chain: 9.5 mg/L (ref 3.3–19.4)
Kappa, lambda light chain ratio: 0.61 (ref 0.26–1.65)
Lambda free light chains: 15.7 mg/L (ref 5.7–26.3)

## 2018-04-04 LAB — BETA 2 MICROGLOBULIN, SERUM: Beta-2 Microglobulin: 1.9 mg/L (ref 0.6–2.4)

## 2018-04-05 ENCOUNTER — Encounter: Payer: Self-pay | Admitting: Hematology and Oncology

## 2018-04-05 DIAGNOSIS — Z823 Family history of stroke: Secondary | ICD-10-CM | POA: Diagnosis not present

## 2018-04-05 DIAGNOSIS — C9000 Multiple myeloma not having achieved remission: Secondary | ICD-10-CM | POA: Diagnosis not present

## 2018-04-05 DIAGNOSIS — Z7982 Long term (current) use of aspirin: Secondary | ICD-10-CM | POA: Diagnosis not present

## 2018-04-05 DIAGNOSIS — K219 Gastro-esophageal reflux disease without esophagitis: Secondary | ICD-10-CM | POA: Diagnosis not present

## 2018-04-05 DIAGNOSIS — Z809 Family history of malignant neoplasm, unspecified: Secondary | ICD-10-CM | POA: Diagnosis not present

## 2018-04-05 DIAGNOSIS — I10 Essential (primary) hypertension: Secondary | ICD-10-CM | POA: Diagnosis not present

## 2018-04-05 DIAGNOSIS — Z7952 Long term (current) use of systemic steroids: Secondary | ICD-10-CM | POA: Diagnosis not present

## 2018-04-05 DIAGNOSIS — H3530 Unspecified macular degeneration: Secondary | ICD-10-CM | POA: Diagnosis not present

## 2018-04-05 DIAGNOSIS — Z9481 Bone marrow transplant status: Secondary | ICD-10-CM | POA: Diagnosis not present

## 2018-04-05 DIAGNOSIS — R32 Unspecified urinary incontinence: Secondary | ICD-10-CM | POA: Diagnosis not present

## 2018-04-05 LAB — MULTIPLE MYELOMA PANEL, SERUM
Albumin SerPl Elph-Mcnc: 3.3 g/dL (ref 2.9–4.4)
Albumin/Glob SerPl: 1.4 (ref 0.7–1.7)
Alpha 1: 0.2 g/dL (ref 0.0–0.4)
Alpha2 Glob SerPl Elph-Mcnc: 0.9 g/dL (ref 0.4–1.0)
B-Globulin SerPl Elph-Mcnc: 0.7 g/dL (ref 0.7–1.3)
Gamma Glob SerPl Elph-Mcnc: 0.6 g/dL (ref 0.4–1.8)
Globulin, Total: 2.4 g/dL (ref 2.2–3.9)
IgA: 122 mg/dL (ref 64–422)
IgG (Immunoglobin G), Serum: 522 mg/dL — ABNORMAL LOW (ref 700–1600)
IgM (Immunoglobulin M), Srm: 117 mg/dL (ref 26–217)
M Protein SerPl Elph-Mcnc: 0.2 g/dL — ABNORMAL HIGH
Total Protein ELP: 5.7 g/dL — ABNORMAL LOW (ref 6.0–8.5)

## 2018-04-09 ENCOUNTER — Other Ambulatory Visit: Payer: Self-pay | Admitting: Cardiovascular Disease

## 2018-04-09 NOTE — Telephone Encounter (Signed)
Patient needs refills but would like a change in them she is asking if we could send in the 6.25, for she is able to cut it in half and it lasts longer for her    *STAT* If patient is at the pharmacy, call can be transferred to refill team.   1. Which medications need to be refilled? (please list name of each medication and dose if known) Carvedilol 6.25 for 90 day    2. Which pharmacy/location (including street and city if local pharmacy) is medication to be sent to? [facility] on [address]   3. Do they need a 30 day or 90 day supply? 90 day

## 2018-04-09 NOTE — Telephone Encounter (Signed)
Pt requesting Carvedilol 6.25 mg tablet Pt would like to cut tablets in half and take 1/2 tablet BID. Pt mentioned that she had 6.25 mg tablet before and was cutting for the proper dose 3.125 mg BID.  Pt was made aware that we have to write Scripts how they are prescribed with proper instructions as ordered by her care provider. She request for change for she gets more pills for her money. Please advise.

## 2018-04-10 ENCOUNTER — Inpatient Hospital Stay: Payer: Medicare HMO

## 2018-04-10 ENCOUNTER — Telehealth: Payer: Self-pay | Admitting: Hematology and Oncology

## 2018-04-10 ENCOUNTER — Inpatient Hospital Stay: Payer: Medicare HMO | Admitting: Hematology and Oncology

## 2018-04-10 ENCOUNTER — Encounter: Payer: Self-pay | Admitting: Hematology and Oncology

## 2018-04-10 VITALS — BP 131/69 | HR 61 | Temp 98.9°F | Resp 16

## 2018-04-10 DIAGNOSIS — C9002 Multiple myeloma in relapse: Secondary | ICD-10-CM

## 2018-04-10 DIAGNOSIS — Z23 Encounter for immunization: Secondary | ICD-10-CM

## 2018-04-10 DIAGNOSIS — Z299 Encounter for prophylactic measures, unspecified: Secondary | ICD-10-CM

## 2018-04-10 DIAGNOSIS — D469 Myelodysplastic syndrome, unspecified: Secondary | ICD-10-CM

## 2018-04-10 DIAGNOSIS — D61818 Other pancytopenia: Secondary | ICD-10-CM

## 2018-04-10 DIAGNOSIS — H9313 Tinnitus, bilateral: Secondary | ICD-10-CM | POA: Insufficient documentation

## 2018-04-10 DIAGNOSIS — Z5112 Encounter for antineoplastic immunotherapy: Secondary | ICD-10-CM | POA: Diagnosis not present

## 2018-04-10 LAB — CBC WITH DIFFERENTIAL (CANCER CENTER ONLY)
Abs Immature Granulocytes: 0.02 10*3/uL (ref 0.00–0.07)
Basophils Absolute: 0 10*3/uL (ref 0.0–0.1)
Basophils Relative: 0 %
Eosinophils Absolute: 0 10*3/uL (ref 0.0–0.5)
Eosinophils Relative: 0 %
HCT: 37.2 % (ref 36.0–46.0)
Hemoglobin: 12.3 g/dL (ref 12.0–15.0)
Immature Granulocytes: 0 %
Lymphocytes Relative: 39 %
Lymphs Abs: 2.8 10*3/uL (ref 0.7–4.0)
MCH: 34.6 pg — ABNORMAL HIGH (ref 26.0–34.0)
MCHC: 33.1 g/dL (ref 30.0–36.0)
MCV: 104.5 fL — ABNORMAL HIGH (ref 80.0–100.0)
Monocytes Absolute: 0.8 10*3/uL (ref 0.1–1.0)
Monocytes Relative: 11 %
Neutro Abs: 3.7 10*3/uL (ref 1.7–7.7)
Neutrophils Relative %: 50 %
Platelet Count: 235 10*3/uL (ref 150–400)
RBC: 3.56 MIL/uL — ABNORMAL LOW (ref 3.87–5.11)
RDW: 14.2 % (ref 11.5–15.5)
WBC Count: 7.3 10*3/uL (ref 4.0–10.5)
nRBC: 0 % (ref 0.0–0.2)

## 2018-04-10 LAB — CMP (CANCER CENTER ONLY)
ALT: 16 U/L (ref 0–44)
AST: 11 U/L — ABNORMAL LOW (ref 15–41)
Albumin: 3.4 g/dL — ABNORMAL LOW (ref 3.5–5.0)
Alkaline Phosphatase: 70 U/L (ref 38–126)
Anion gap: 11 (ref 5–15)
BUN: 17 mg/dL (ref 8–23)
CO2: 26 mmol/L (ref 22–32)
Calcium: 9.6 mg/dL (ref 8.9–10.3)
Chloride: 104 mmol/L (ref 98–111)
Creatinine: 0.72 mg/dL (ref 0.44–1.00)
GFR, Est AFR Am: >60 mL/min (ref >60)
GFR, Estimated: >60 mL/min (ref >60)
Glucose, Bld: 89 mg/dL (ref 70–99)
Potassium: 3.8 mmol/L (ref 3.5–5.1)
Sodium: 141 mmol/L (ref 135–145)
Total Bilirubin: 0.4 mg/dL (ref 0.3–1.2)
Total Protein: 6.1 g/dL — ABNORMAL LOW (ref 6.5–8.1)

## 2018-04-10 MED ORDER — INFLUENZA VAC SPLIT QUAD 0.5 ML IM SUSY
0.5000 mL | PREFILLED_SYRINGE | Freq: Once | INTRAMUSCULAR | Status: AC
  Administered 2018-04-10: 0.5 mL via INTRAMUSCULAR

## 2018-04-10 MED ORDER — SODIUM CHLORIDE 0.9 % IV SOLN
16.0000 mg/kg | Freq: Once | INTRAVENOUS | Status: AC
  Administered 2018-04-10: 1200 mg via INTRAVENOUS
  Filled 2018-04-10: qty 60

## 2018-04-10 MED ORDER — SODIUM CHLORIDE 0.9 % IV SOLN
Freq: Once | INTRAVENOUS | Status: AC
  Administered 2018-04-10: 09:00:00 via INTRAVENOUS
  Filled 2018-04-10: qty 250

## 2018-04-10 MED ORDER — SODIUM CHLORIDE 0.9% FLUSH
10.0000 mL | INTRAVENOUS | Status: DC | PRN
  Administered 2018-04-10: 10 mL
  Filled 2018-04-10: qty 10

## 2018-04-10 MED ORDER — ACETAMINOPHEN 325 MG PO TABS
ORAL_TABLET | ORAL | Status: AC
  Filled 2018-04-10: qty 2

## 2018-04-10 MED ORDER — SODIUM CHLORIDE 0.9% FLUSH
10.0000 mL | Freq: Once | INTRAVENOUS | Status: AC
  Administered 2018-04-10: 10 mL
  Filled 2018-04-10: qty 10

## 2018-04-10 MED ORDER — ACETAMINOPHEN 325 MG PO TABS
650.0000 mg | ORAL_TABLET | Freq: Once | ORAL | Status: AC
  Administered 2018-04-10: 650 mg via ORAL

## 2018-04-10 MED ORDER — PROCHLORPERAZINE MALEATE 10 MG PO TABS
10.0000 mg | ORAL_TABLET | Freq: Once | ORAL | Status: AC
  Administered 2018-04-10: 10 mg via ORAL

## 2018-04-10 MED ORDER — DIPHENHYDRAMINE HCL 25 MG PO CAPS
50.0000 mg | ORAL_CAPSULE | Freq: Once | ORAL | Status: AC
  Administered 2018-04-10: 50 mg via ORAL

## 2018-04-10 MED ORDER — PROCHLORPERAZINE MALEATE 10 MG PO TABS
ORAL_TABLET | ORAL | Status: AC
  Filled 2018-04-10: qty 1

## 2018-04-10 MED ORDER — SODIUM CHLORIDE 0.9 % IV SOLN
20.0000 mg | Freq: Once | INTRAVENOUS | Status: AC
  Administered 2018-04-10: 20 mg via INTRAVENOUS
  Filled 2018-04-10: qty 2

## 2018-04-10 MED ORDER — HEPARIN SOD (PORK) LOCK FLUSH 100 UNIT/ML IV SOLN
500.0000 [IU] | Freq: Once | INTRAVENOUS | Status: AC | PRN
  Administered 2018-04-10: 500 [IU]
  Filled 2018-04-10: qty 5

## 2018-04-10 MED ORDER — INFLUENZA VAC SPLIT QUAD 0.5 ML IM SUSY
PREFILLED_SYRINGE | INTRAMUSCULAR | Status: AC
  Filled 2018-04-10: qty 0.5

## 2018-04-10 MED ORDER — DIPHENHYDRAMINE HCL 25 MG PO CAPS
ORAL_CAPSULE | ORAL | Status: AC
  Filled 2018-04-10: qty 2

## 2018-04-10 NOTE — Telephone Encounter (Signed)
Per 10/16 los, made appts. Patient is my chart active.  Called to let know I added appts to her schedule.

## 2018-04-10 NOTE — Assessment & Plan Note (Signed)
We discussed the importance of preventive care and reviewed the vaccination programs. She does not have any prior allergic reactions to influenza vaccination. She agrees to proceed with influenza vaccination today and we will administer it today at the clinic.  

## 2018-04-10 NOTE — Assessment & Plan Note (Signed)
I do not believe this is a side effects of treatment We discussed ENT referral but the patient would like to wait for now

## 2018-04-10 NOTE — Patient Instructions (Signed)
[facility] Discharge Instructions for Patients Receiving Chemotherapy  Today you received the following chemotherapy agents: Darzalex  To help prevent nausea and vomiting after your treatment, we encourage you to take your nausea medication as directed.    If you develop nausea and vomiting that is not controlled by your nausea medication, call the clinic.   BELOW ARE SYMPTOMS THAT SHOULD BE REPORTED IMMEDIATELY:  *FEVER GREATER THAN 100.5 F  *CHILLS WITH OR WITHOUT FEVER  NAUSEA AND VOMITING THAT IS NOT CONTROLLED WITH YOUR NAUSEA MEDICATION  *UNUSUAL SHORTNESS OF BREATH  *UNUSUAL BRUISING OR BLEEDING  TENDERNESS IN MOUTH AND THROAT WITH OR WITHOUT PRESENCE OF ULCERS  *URINARY PROBLEMS  *BOWEL PROBLEMS  UNUSUAL RASH Items with * indicate a potential emergency and should be followed up as soon as possible.  Feel free to call the clinic should you have any questions or concerns. The clinic phone number is [phone].  Please show the CHEMO ALERT CARD at check-in to the Emergency Department and triage nurse.   

## 2018-04-10 NOTE — Telephone Encounter (Signed)
Left a message for the patient to call back.  

## 2018-04-10 NOTE — Progress Notes (Signed)
[facility] OFFICE PROGRESS NOTE  Patient Care Team: [name], [name], MD as PCP - General (Internal Medicine) [name], MD as Referring Physician (Hematology and Oncology) [name], MD as Consulting Physician (Internal Medicine) [name], [name], MD as Consulting Physician (Infectious Diseases) [name], MD as Consulting Physician (Hematology and Oncology) [name], NP as Nurse Practitioner (Hematology and Oncology)  ASSESSMENT & PLAN:  Multiple myeloma [facility]) I have reviewed her myeloma panel with the patient She has complete normalization of serum light chain M protein is improving She has minimum side effects from treatment except for intermittent pancytopenia She will continue treatment as schedule with weekly daratumumab Next month, her treatment will be switched to every other week She will continue to take Pomalyst, they use 1-21, rest 7 days I recommend oral dexamethasone taper over the next few weeks She is reminded to take acyclovir for antimicrobial prophylaxis She will continue aspirin for DVT prophylaxis She will continue calcium with vitamin D along with Zometa every 3 months   Tinnitus of both ears I do not believe this is a side effects of treatment We discussed ENT referral but the patient would like to wait for now  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 She does not have any prior allergic reactions to influenza vaccination. She agrees to proceed with influenza vaccination today and we will administer it today at the clinic.    No orders of the defined types were placed in this encounter.   INTERVAL HISTORY: Please see below for problem oriented  charting. She returns for further follow-up She is seen in the infusion room while receiving daratumumab She continues to have low-grade intermittent fever but nothing more than the 100 No cough, chest pain or shortness of breath She complained of mild intermittent tinnitus Appetite is stable.  Denies recent diarrhea  SUMMARY OF ONCOLOGIC HISTORY: Oncology History   Multiple myeloma, Ig A Lambda, M spike 3.54 grams, Calcium 9.2, Creatinine 0.8, Beta 2 microglobulin 4.52, IgA 4840 mg/dL, lambda light chain 75.4, albumin 3.6, hemoglobin 9.7, platelet 115    Primary site: Multiple Myeloma   Staging method: AJCC 6th Edition   Clinical: Stage IIA signed by [name], MD on 11/07/2013  2:46 PM   Summary: Stage IIA        Multiple myeloma ([place])   10/31/2013 Bone Marrow Biopsy    Bone marrow biopsy confirmed multiple myeloma with 40% bone marrow involvement. Skeletal survey showed minimal lesions in her score with generalized demineralization    11/10/2013 - 02/13/2014 Chemotherapy    The patient is started on induction chemotherapy with weekly dexamethasone 40 mg by mouth as well as Velcade subcutaneous injection on days 1, 4, 8 and 11. On 11/21/2013, she was started on monthly Zometa.    12/23/2013 Adverse Reaction    The dose of Velcade was reduced due to thrombocytopenia.    01/28/2014 - 04/07/2014 Chemotherapy    Revlimid is added. Treatment was discontinued due to lack of response.    02/24/2014 - 04/07/2014 Chemotherapy    Due to worsening peripheral neuropathy, Velcade injection is changed to once a week. Revlimid was given 21 days on, 7 days off.    04/07/2014 - 04/10/2014 Chemotherapy    Revlimid was discontinued due to lack of response.  Chemotherapy was changed back to Velcade injection twice a week, 2 weeks on 1 week off. Her treatment was switched to to minimum response    04/20/2014 - 06/02/2014 Chemotherapy    chemotherapy is switched to Carfilzomib, Cytoxan and dexamethasone.     04/22/2014 Procedure    she has placement of port for chemotherapy.    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 at [facility]    10/20/2014 - 03/24/2015 Chemotherapy     she received chemotherapy with Kyprolis, Revlimid and dexamethasone    10/22/2014 Procedure    She has port placement    01/19/2015 Tumor Marker    IgA lambda M spike at 0.4 g     01/20/2015 Miscellaneous    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 No cource was found and fever resolved with IV vancomycin and meropenem    09/13/2015 Bone Marrow Biopsy    Bone marrow biopsy showed no increased blasts, 3-4 % plasma cells    03/02/2016 Bone Marrow Biopsy    Bone marrow biopsy at [facility] showed normocellular (30%) bone marrow with trilineage hematopoiesis. No significant increase in blasts. No significant increase in plasma cells.    05/12/2016 Imaging    DEXA scan at [facility] showed osteopenia    10/24/2016 Imaging    Skeletal survey at [facility], no new lesions    12/07/2017 Imaging    No focal abnormality noted to suggest myeloma. Exam is stable from prior exam.    02/20/2018 -   Chemotherapy    The patient had daratumumab (DARZALEX) 1,200 mg in sodium chloride 0.9 % 940 mL (1.2 mg/mL) chemo infusion, 16 mg/kg = 1,200 mg, Intravenous, Once, 1 of 1 cycle Administration: 1,200 mg (03/06/2018) daratumumab (DARZALEX) 1,200 mg in sodium chloride 0.9 % 440 mL (2.4 mg/mL) chemo infusion, 16 mg/kg = 1,200 mg, Intravenous, Once, 1 of 1 cycle Administration: 1,200 mg (03/13/2018), 1,200 mg (03/20/2018) daratumumab (DARZALEX) 1,200 mg in sodium chloride 0.9 % 440 mL chemo infusion, 16 mg/kg = 1,200 mg, Intravenous, Once, 2 of 7 cycles Administration: 1,200 mg (03/28/2018), 1,200 mg (04/03/2018), 1,200 mg (04/10/2018)  for chemotherapy treatment.     03/01/2018 Procedure    Successful 8 French right internal jugular vein power port placement with its tip at the SVC/RA junction.     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    10/31/2015 Miscellaneous    She has engrafted     REVIEW OF SYSTEMS:   Constitutional: Denies fevers, chills or abnormal weight loss Eyes: Denies blurriness of vision Ears, nose, mouth, throat, and face: Denies mucositis or sore throat Respiratory: Denies cough, dyspnea or wheezes Cardiovascular: Denies palpitation, chest discomfort or lower extremity swelling Gastrointestinal:  Denies nausea, heartburn or change in bowel habits Skin: Denies abnormal skin  rashes Lymphatics: Denies new lymphadenopathy or easy bruising Neurological:Denies numbness, tingling or new weaknesses Behavioral/Psych: Mood is stable, no new changes  All other systems were reviewed with the patient and are negative.  I have reviewed the past medical history, past surgical history, social history and family history with the patient and they are unchanged from previous note.  ALLERGIES:  has No Known Allergies.  MEDICATIONS:  Current Outpatient Medications  Medication Sig Dispense Refill  . acyclovir (ZOVIRAX) 400 MG tablet Take 1 tablet (400 mg total) by mouth 2 (two) times daily. 180 tablet 11  . calcium carbonate (TUMS - DOSED IN MG ELEMENTAL CALCIUM) 500 MG chewable tablet Chew 1 tablet by mouth daily.    . carvedilol (COREG) 3.125 MG tablet TAKE 1 TABLET (3.125 MG TOTAL) BY MOUTH 2 (TWO) TIMES DAILY WITH A MEAL. 180 tablet 3  . Cholecalciferol (VITAMIN D-1000 MAX ST) 1000 units tablet Take 1,000 Units by mouth daily.    Marland Kitchen dexamethasone (DECADRON) 4 MG tablet Take weekly 20 mg with food 60 tablet 9  . docusate sodium (COLACE) 100 MG capsule Take 100 mg by mouth daily as needed for mild constipation.     . lidocaine-prilocaine (EMLA) cream Apply to affected area once 30 g 3  . loperamide (IMODIUM) 2 MG capsule Take by mouth as needed for diarrhea or loose stools. As needed only    . mirtazapine (REMERON) 30 MG tablet Take 1 tablet by mouth at bedtime.    . Multiple Vitamin (MULTIVITAMIN WITH MINERALS) TABS tablet Take 1 tablet by mouth daily.    . Multiple Vitamins-Minerals (ICAPS AREDS 2) CAPS Take 1 capsule by mouth 2 (two) times daily.    . ondansetron (ZOFRAN) 8 MG tablet Take 1 tablet (8 mg total) by mouth every 8 (eight) hours as needed (Nausea or vomiting). 30 tablet 1  . pantoprazole (PROTONIX) 40 MG tablet TAKE 1 TABLET (40 MG TOTAL) BY MOUTH DAILY BEFORE BREAKFAST. 90 tablet 3  . pomalidomide (POMALYST) 2 MG capsule Take 1 capsule (2 mg total) by mouth daily.  Take with water on days 1-21. Repeat every 28 days. 21 capsule 0  . prochlorperazine (COMPAZINE) 10 MG tablet Take 1 tablet (10 mg total) by mouth every 6 (six) hours as needed (Nausea or vomiting). 30 tablet 1   No current facility-administered medications for this visit.    Facility-Administered Medications Ordered in Other Visits  Medication Dose Route Frequency Provider Last Rate Last Dose  . sodium chloride flush (NS) 0.9 % injection 10 mL  10 mL Intracatheter PRN Alvy Bimler, Ellissa Ayo, MD   10 mL at 04/10/18 1247    PHYSICAL EXAMINATION: ECOG PERFORMANCE STATUS: 1 - Symptomatic but completely ambulatory GENERAL:alert, no distress  and comfortable SKIN: skin color, texture, turgor are normal, no rashes or significant lesions EYES: normal, Conjunctiva are pink and non-injected, sclera clear OROPHARYNX:no exudate, no erythema and lips, buccal mucosa, and tongue normal  NECK: supple, thyroid normal size, non-tender, without nodularity LYMPH:  no palpable lymphadenopathy in the cervical, axillary or inguinal LUNGS: clear to auscultation and percussion with normal breathing effort HEART: regular rate & rhythm and no murmurs and no lower extremity edema ABDOMEN:abdomen soft, non-tender and normal bowel sounds Musculoskeletal:no cyanosis of digits and no clubbing  NEURO: alert & oriented x 3 with fluent speech, no focal motor/sensory deficits  LABORATORY DATA:  I have reviewed the data as listed    Component Value Date/Time   NA 141 04/10/2018 0811   NA 142 01/04/2017 0902   K 3.8 04/10/2018 0811   K 3.9 01/04/2017 0902   CL 104 04/10/2018 0811   CO2 26 04/10/2018 0811   CO2 28 01/04/2017 0902   GLUCOSE 89 04/10/2018 0811   GLUCOSE 92 01/04/2017 0902   BUN 17 04/10/2018 0811   BUN 9.5 01/04/2017 0902   CREATININE 0.72 04/10/2018 0811   CREATININE 0.7 01/04/2017 0902   CALCIUM 9.6 04/10/2018 0811   CALCIUM 9.0 01/04/2017 0902   PROT 6.1 (L) 04/10/2018 0811   PROT 6.0 (L) 01/04/2017 0902    PROT 5.8 (L) 01/04/2017 0902   ALBUMIN 3.4 (L) 04/10/2018 0811   ALBUMIN 3.4 (L) 01/04/2017 0902   AST 11 (L) 04/10/2018 0811   AST 21 01/04/2017 0902   ALT 16 04/10/2018 0811   ALT 16 01/04/2017 0902   ALKPHOS 70 04/10/2018 0811   ALKPHOS 101 01/04/2017 0902   BILITOT 0.4 04/10/2018 0811   BILITOT 0.56 01/04/2017 0902   GFRNONAA >60 04/10/2018 0811   GFRAA >60 04/10/2018 0811    No results found for: SPEP, UPEP  Lab Results  Component Value Date   WBC 7.3 04/10/2018   NEUTROABS 3.7 04/10/2018   HGB 12.3 04/10/2018   HCT 37.2 04/10/2018   MCV 104.5 (H) 04/10/2018   PLT 235 04/10/2018      Chemistry      Component Value Date/Time   NA 141 04/10/2018 0811   NA 142 01/04/2017 0902   K 3.8 04/10/2018 0811   K 3.9 01/04/2017 0902   CL 104 04/10/2018 0811   CO2 26 04/10/2018 0811   CO2 28 01/04/2017 0902   BUN 17 04/10/2018 0811   BUN 9.5 01/04/2017 0902   CREATININE 0.72 04/10/2018 0811   CREATININE 0.7 01/04/2017 0902      Component Value Date/Time   CALCIUM 9.6 04/10/2018 0811   CALCIUM 9.0 01/04/2017 0902   ALKPHOS 70 04/10/2018 0811   ALKPHOS 101 01/04/2017 0902   AST 11 (L) 04/10/2018 0811   AST 21 01/04/2017 0902   ALT 16 04/10/2018 0811   ALT 16 01/04/2017 0902   BILITOT 0.4 04/10/2018 0811   BILITOT 0.56 01/04/2017 0902      All questions were answered. The patient knows to call the clinic with any problems, questions or concerns. No barriers to learning was detected.  I spent 15 minutes counseling the patient face to face. The total time spent in the appointment was 20 minutes and more than 50% was on counseling and review of test results  [name], MD 04/10/2018 2:46 PM

## 2018-04-10 NOTE — Assessment & Plan Note (Addendum)
I have reviewed her myeloma panel with the patient She has complete normalization of serum light chain M protein is improving She has minimum side effects from treatment except for intermittent pancytopenia She will continue treatment as schedule with weekly daratumumab Next month, her treatment will be switched to every other week She will continue to take Pomalyst, they use 1-21, rest 7 days I recommend oral dexamethasone taper over the next few weeks She is reminded to take acyclovir for antimicrobial prophylaxis She will continue aspirin for DVT prophylaxis She will continue calcium with vitamin D along with Zometa every 3 months

## 2018-04-10 NOTE — Assessment & Plan Note (Signed)
CBC is normal except for high MCV She has intermittent pancytopenia related to side effects of treatment

## 2018-04-17 ENCOUNTER — Inpatient Hospital Stay: Payer: Medicare HMO

## 2018-04-17 ENCOUNTER — Encounter: Payer: Self-pay | Admitting: Hematology and Oncology

## 2018-04-17 VITALS — BP 129/68 | HR 66 | Temp 98.6°F | Resp 18 | Ht 63.0 in | Wt 152.8 lb

## 2018-04-17 DIAGNOSIS — Z23 Encounter for immunization: Secondary | ICD-10-CM | POA: Diagnosis not present

## 2018-04-17 DIAGNOSIS — H9313 Tinnitus, bilateral: Secondary | ICD-10-CM | POA: Diagnosis not present

## 2018-04-17 DIAGNOSIS — D61818 Other pancytopenia: Secondary | ICD-10-CM | POA: Diagnosis not present

## 2018-04-17 DIAGNOSIS — C9002 Multiple myeloma in relapse: Secondary | ICD-10-CM

## 2018-04-17 DIAGNOSIS — Z5112 Encounter for antineoplastic immunotherapy: Secondary | ICD-10-CM | POA: Diagnosis not present

## 2018-04-17 LAB — CBC WITH DIFFERENTIAL (CANCER CENTER ONLY)
Abs Immature Granulocytes: 0.02 10*3/uL (ref 0.00–0.07)
Basophils Absolute: 0 10*3/uL (ref 0.0–0.1)
Basophils Relative: 0 %
Eosinophils Absolute: 0.2 10*3/uL (ref 0.0–0.5)
Eosinophils Relative: 2 %
HCT: 35.8 % — ABNORMAL LOW (ref 36.0–46.0)
Hemoglobin: 11.7 g/dL — ABNORMAL LOW (ref 12.0–15.0)
Immature Granulocytes: 0 %
Lymphocytes Relative: 35 %
Lymphs Abs: 2.6 10*3/uL (ref 0.7–4.0)
MCH: 35.3 pg — ABNORMAL HIGH (ref 26.0–34.0)
MCHC: 32.7 g/dL (ref 30.0–36.0)
MCV: 108.2 fL — ABNORMAL HIGH (ref 80.0–100.0)
Monocytes Absolute: 1.5 10*3/uL — ABNORMAL HIGH (ref 0.1–1.0)
Monocytes Relative: 21 %
Neutro Abs: 3.1 10*3/uL (ref 1.7–7.7)
Neutrophils Relative %: 42 %
Platelet Count: 154 10*3/uL (ref 150–400)
RBC: 3.31 MIL/uL — ABNORMAL LOW (ref 3.87–5.11)
RDW: 14.9 % (ref 11.5–15.5)
WBC Count: 7.4 10*3/uL (ref 4.0–10.5)
nRBC: 0 % (ref 0.0–0.2)

## 2018-04-17 LAB — CMP (CANCER CENTER ONLY)
ALT: 16 U/L (ref 0–44)
AST: 13 U/L — ABNORMAL LOW (ref 15–41)
Albumin: 3.3 g/dL — ABNORMAL LOW (ref 3.5–5.0)
Alkaline Phosphatase: 74 U/L (ref 38–126)
Anion gap: 11 (ref 5–15)
BUN: 20 mg/dL (ref 8–23)
CO2: 24 mmol/L (ref 22–32)
Calcium: 8.8 mg/dL — ABNORMAL LOW (ref 8.9–10.3)
Chloride: 106 mmol/L (ref 98–111)
Creatinine: 0.71 mg/dL (ref 0.44–1.00)
GFR, Est AFR Am: >60 mL/min (ref >60)
GFR, Estimated: >60 mL/min (ref >60)
Glucose, Bld: 79 mg/dL (ref 70–99)
Potassium: 4.1 mmol/L (ref 3.5–5.1)
Sodium: 141 mmol/L (ref 135–145)
Total Bilirubin: 0.3 mg/dL (ref 0.3–1.2)
Total Protein: 6 g/dL — ABNORMAL LOW (ref 6.5–8.1)

## 2018-04-17 MED ORDER — ACETAMINOPHEN 325 MG PO TABS
ORAL_TABLET | ORAL | Status: AC
  Filled 2018-04-17: qty 2

## 2018-04-17 MED ORDER — SODIUM CHLORIDE 0.9% FLUSH
10.0000 mL | INTRAVENOUS | Status: DC | PRN
  Administered 2018-04-17: 10 mL
  Filled 2018-04-17: qty 10

## 2018-04-17 MED ORDER — PROCHLORPERAZINE MALEATE 10 MG PO TABS
ORAL_TABLET | ORAL | Status: AC
  Filled 2018-04-17: qty 1

## 2018-04-17 MED ORDER — DIPHENHYDRAMINE HCL 25 MG PO CAPS
ORAL_CAPSULE | ORAL | Status: AC
  Filled 2018-04-17: qty 2

## 2018-04-17 MED ORDER — PROCHLORPERAZINE MALEATE 10 MG PO TABS
10.0000 mg | ORAL_TABLET | Freq: Once | ORAL | Status: AC
  Administered 2018-04-17: 10 mg via ORAL

## 2018-04-17 MED ORDER — ACETAMINOPHEN 325 MG PO TABS
650.0000 mg | ORAL_TABLET | Freq: Once | ORAL | Status: AC
  Administered 2018-04-17: 650 mg via ORAL

## 2018-04-17 MED ORDER — HEPARIN SOD (PORK) LOCK FLUSH 100 UNIT/ML IV SOLN
500.0000 [IU] | Freq: Once | INTRAVENOUS | Status: AC | PRN
  Administered 2018-04-17: 500 [IU]
  Filled 2018-04-17: qty 5

## 2018-04-17 MED ORDER — DIPHENHYDRAMINE HCL 25 MG PO CAPS
50.0000 mg | ORAL_CAPSULE | Freq: Once | ORAL | Status: AC
  Administered 2018-04-17: 50 mg via ORAL

## 2018-04-17 MED ORDER — SODIUM CHLORIDE 0.9 % IV SOLN
Freq: Once | INTRAVENOUS | Status: AC
  Administered 2018-04-17: 09:00:00 via INTRAVENOUS
  Filled 2018-04-17: qty 250

## 2018-04-17 MED ORDER — SODIUM CHLORIDE 0.9 % IV SOLN
20.0000 mg | Freq: Once | INTRAVENOUS | Status: AC
  Administered 2018-04-17: 20 mg via INTRAVENOUS
  Filled 2018-04-17: qty 2

## 2018-04-17 MED ORDER — SODIUM CHLORIDE 0.9 % IV SOLN
16.0000 mg/kg | Freq: Once | INTRAVENOUS | Status: AC
  Administered 2018-04-17: 1200 mg via INTRAVENOUS
  Filled 2018-04-17: qty 60

## 2018-04-17 NOTE — Telephone Encounter (Signed)
Patient stated that she would get the prescription as ordered, 3.125 mg bid.

## 2018-04-17 NOTE — Patient Instructions (Signed)
[facility] Discharge Instructions for Patients Receiving Chemotherapy  Today you received the following chemotherapy agents Daratumumab(Darzalex)  To help prevent nausea and vomiting after your treatment, we encourage you to take your nausea medication as directed.    If you develop nausea and vomiting that is not controlled by your nausea medication, call the clinic.   BELOW ARE SYMPTOMS THAT SHOULD BE REPORTED IMMEDIATELY:  *FEVER GREATER THAN 100.5 F  *CHILLS WITH OR WITHOUT FEVER  NAUSEA AND VOMITING THAT IS NOT CONTROLLED WITH YOUR NAUSEA MEDICATION  *UNUSUAL SHORTNESS OF BREATH  *UNUSUAL BRUISING OR BLEEDING  TENDERNESS IN MOUTH AND THROAT WITH OR WITHOUT PRESENCE OF ULCERS  *URINARY PROBLEMS  *BOWEL PROBLEMS  UNUSUAL RASH Items with * indicate a potential emergency and should be followed up as soon as possible.  Feel free to call the clinic should you have any questions or concerns. The clinic phone number is [phone].  Please show the CHEMO ALERT CARD at check-in to the Emergency Department and triage nurse.   

## 2018-04-17 NOTE — Patient Instructions (Signed)

## 2018-04-18 ENCOUNTER — Telehealth: Payer: Self-pay

## 2018-04-18 NOTE — Telephone Encounter (Signed)
Oral Oncology Patient Advocate Encounter  Pomalyst will need to be renewed before 06/25/18 to continue getting this from [facility]. I filled out a new application for the patient and placed the doctors portion to sign in the folder.  I called the patient to make them aware of the renewal and that I would need income documentation from 2018 as well as their signature.  She stated she would sign and bring the income documents at her appointment on 04/24/18  Will continue to update  [facility] Phone [phone] Fax [phone]

## 2018-04-24 ENCOUNTER — Other Ambulatory Visit: Payer: Self-pay

## 2018-04-24 ENCOUNTER — Inpatient Hospital Stay: Payer: Medicare HMO

## 2018-04-24 ENCOUNTER — Encounter: Payer: Self-pay | Admitting: Hematology and Oncology

## 2018-04-24 VITALS — BP 125/62 | HR 64 | Temp 98.5°F | Resp 18 | Ht 63.0 in | Wt 167.5 lb

## 2018-04-24 DIAGNOSIS — C9002 Multiple myeloma in relapse: Secondary | ICD-10-CM

## 2018-04-24 DIAGNOSIS — D469 Myelodysplastic syndrome, unspecified: Secondary | ICD-10-CM

## 2018-04-24 DIAGNOSIS — H9313 Tinnitus, bilateral: Secondary | ICD-10-CM | POA: Diagnosis not present

## 2018-04-24 DIAGNOSIS — D61818 Other pancytopenia: Secondary | ICD-10-CM | POA: Diagnosis not present

## 2018-04-24 DIAGNOSIS — Z5112 Encounter for antineoplastic immunotherapy: Secondary | ICD-10-CM | POA: Diagnosis not present

## 2018-04-24 DIAGNOSIS — Z23 Encounter for immunization: Secondary | ICD-10-CM | POA: Diagnosis not present

## 2018-04-24 LAB — CBC WITH DIFFERENTIAL (CANCER CENTER ONLY)
Abs Immature Granulocytes: 0.01 10*3/uL (ref 0.00–0.07)
Basophils Absolute: 0 10*3/uL (ref 0.0–0.1)
Basophils Relative: 0 %
Eosinophils Absolute: 0 10*3/uL (ref 0.0–0.5)
Eosinophils Relative: 0 %
HCT: 35 % — ABNORMAL LOW (ref 36.0–46.0)
Hemoglobin: 11.5 g/dL — ABNORMAL LOW (ref 12.0–15.0)
Immature Granulocytes: 0 %
Lymphocytes Relative: 49 %
Lymphs Abs: 2.5 10*3/uL (ref 0.7–4.0)
MCH: 35.2 pg — ABNORMAL HIGH (ref 26.0–34.0)
MCHC: 32.9 g/dL (ref 30.0–36.0)
MCV: 107 fL — ABNORMAL HIGH (ref 80.0–100.0)
Monocytes Absolute: 1.2 10*3/uL — ABNORMAL HIGH (ref 0.1–1.0)
Monocytes Relative: 23 %
Neutro Abs: 1.5 10*3/uL — ABNORMAL LOW (ref 1.7–7.7)
Neutrophils Relative %: 28 %
Platelet Count: 213 10*3/uL (ref 150–400)
RBC: 3.27 MIL/uL — ABNORMAL LOW (ref 3.87–5.11)
RDW: 15.4 % (ref 11.5–15.5)
WBC Count: 5.2 10*3/uL (ref 4.0–10.5)
nRBC: 0 % (ref 0.0–0.2)

## 2018-04-24 LAB — CMP (CANCER CENTER ONLY)
ALT: 17 U/L (ref 0–44)
AST: 12 U/L — ABNORMAL LOW (ref 15–41)
Albumin: 3.3 g/dL — ABNORMAL LOW (ref 3.5–5.0)
Alkaline Phosphatase: 75 U/L (ref 38–126)
Anion gap: 11 (ref 5–15)
BUN: 22 mg/dL (ref 8–23)
CO2: 24 mmol/L (ref 22–32)
Calcium: 8.7 mg/dL — ABNORMAL LOW (ref 8.9–10.3)
Chloride: 106 mmol/L (ref 98–111)
Creatinine: 0.73 mg/dL (ref 0.44–1.00)
GFR, Est AFR Am: >60 mL/min (ref >60)
GFR, Estimated: >60 mL/min (ref >60)
Glucose, Bld: 99 mg/dL (ref 70–99)
Potassium: 4.2 mmol/L (ref 3.5–5.1)
Sodium: 141 mmol/L (ref 135–145)
Total Bilirubin: 0.4 mg/dL (ref 0.3–1.2)
Total Protein: 6 g/dL — ABNORMAL LOW (ref 6.5–8.1)

## 2018-04-24 MED ORDER — PROCHLORPERAZINE MALEATE 10 MG PO TABS
ORAL_TABLET | ORAL | Status: AC
  Filled 2018-04-24: qty 1

## 2018-04-24 MED ORDER — ACETAMINOPHEN 325 MG PO TABS
650.0000 mg | ORAL_TABLET | Freq: Once | ORAL | Status: AC
  Administered 2018-04-24: 650 mg via ORAL

## 2018-04-24 MED ORDER — SODIUM CHLORIDE 0.9% FLUSH
10.0000 mL | Freq: Once | INTRAVENOUS | Status: AC | PRN
  Administered 2018-04-24: 10 mL
  Filled 2018-04-24: qty 10

## 2018-04-24 MED ORDER — SODIUM CHLORIDE 0.9 % IV SOLN
16.0000 mg/kg | Freq: Once | INTRAVENOUS | Status: AC
  Administered 2018-04-24: 1200 mg via INTRAVENOUS
  Filled 2018-04-24: qty 60

## 2018-04-24 MED ORDER — ACETAMINOPHEN 325 MG PO TABS
ORAL_TABLET | ORAL | Status: AC
  Filled 2018-04-24: qty 2

## 2018-04-24 MED ORDER — SODIUM CHLORIDE 0.9% FLUSH
3.0000 mL | Freq: Once | INTRAVENOUS | Status: DC | PRN
  Filled 2018-04-24: qty 10

## 2018-04-24 MED ORDER — SODIUM CHLORIDE 0.9 % IV SOLN
20.0000 mg | Freq: Once | INTRAVENOUS | Status: AC
  Administered 2018-04-24: 20 mg via INTRAVENOUS
  Filled 2018-04-24: qty 2

## 2018-04-24 MED ORDER — PROCHLORPERAZINE MALEATE 10 MG PO TABS
10.0000 mg | ORAL_TABLET | Freq: Once | ORAL | Status: AC
  Administered 2018-04-24: 10 mg via ORAL

## 2018-04-24 MED ORDER — DIPHENHYDRAMINE HCL 25 MG PO CAPS
50.0000 mg | ORAL_CAPSULE | Freq: Once | ORAL | Status: AC
  Administered 2018-04-24: 50 mg via ORAL

## 2018-04-24 MED ORDER — POMALIDOMIDE 2 MG PO CAPS: 2.0000 mg | ORAL_CAPSULE | Freq: Every day | ORAL | 0 refills | Status: DC

## 2018-04-24 MED ORDER — DIPHENHYDRAMINE HCL 25 MG PO CAPS
ORAL_CAPSULE | ORAL | Status: AC
  Filled 2018-04-24: qty 2

## 2018-04-24 MED ORDER — SODIUM CHLORIDE 0.9 % IV SOLN
Freq: Once | INTRAVENOUS | Status: AC
  Administered 2018-04-24: 10:00:00 via INTRAVENOUS
  Filled 2018-04-24: qty 250

## 2018-04-24 MED ORDER — SODIUM CHLORIDE 0.9% FLUSH
10.0000 mL | INTRAVENOUS | Status: DC | PRN
  Administered 2018-04-24: 10 mL
  Filled 2018-04-24: qty 10

## 2018-04-24 MED ORDER — HEPARIN SOD (PORK) LOCK FLUSH 100 UNIT/ML IV SOLN
500.0000 [IU] | Freq: Once | INTRAVENOUS | Status: AC | PRN
  Administered 2018-04-24: 500 [IU]
  Filled 2018-04-24: qty 5

## 2018-04-24 NOTE — Patient Instructions (Signed)
[facility] Discharge Instructions for Patients Receiving Chemotherapy  Today you received the following chemotherapy agents: Daratumumab (Darzalex)   To help prevent nausea and vomiting after your treatment, we encourage you to take your nausea medication  as prescribed.    If you develop nausea and vomiting that is not controlled by your nausea medication, call the clinic.   BELOW ARE SYMPTOMS THAT SHOULD BE REPORTED IMMEDIATELY:  *FEVER GREATER THAN 100.5 F  *CHILLS WITH OR WITHOUT FEVER  NAUSEA AND VOMITING THAT IS NOT CONTROLLED WITH YOUR NAUSEA MEDICATION  *UNUSUAL SHORTNESS OF BREATH  *UNUSUAL BRUISING OR BLEEDING  TENDERNESS IN MOUTH AND THROAT WITH OR WITHOUT PRESENCE OF ULCERS  *URINARY PROBLEMS  *BOWEL PROBLEMS  UNUSUAL RASH Items with * indicate a potential emergency and should be followed up as soon as possible.  Feel free to call the clinic should you have any questions or concerns. The clinic phone number is [phone].  Please show the CHEMO ALERT CARD at check-in to the Emergency Department and triage nurse.   

## 2018-04-25 ENCOUNTER — Telehealth: Payer: Self-pay | Admitting: Hematology and Oncology

## 2018-04-25 NOTE — Telephone Encounter (Signed)
NG out 10/30 thru 11/29 - moved 11/6 f/u to [facility]. Spoke with patient.

## 2018-05-01 ENCOUNTER — Inpatient Hospital Stay: Payer: Medicare HMO

## 2018-05-01 ENCOUNTER — Inpatient Hospital Stay: Payer: Medicare HMO | Attending: Hematology and Oncology

## 2018-05-01 ENCOUNTER — Inpatient Hospital Stay: Payer: Medicare HMO | Admitting: Adult Health

## 2018-05-01 ENCOUNTER — Encounter: Payer: Self-pay | Admitting: Adult Health

## 2018-05-01 VITALS — BP 135/69 | HR 73 | Temp 98.6°F | Resp 18 | Ht 63.0 in | Wt 164.1 lb

## 2018-05-01 VITALS — BP 117/64 | HR 69 | Temp 98.0°F | Resp 18

## 2018-05-01 DIAGNOSIS — C9002 Multiple myeloma in relapse: Secondary | ICD-10-CM | POA: Diagnosis not present

## 2018-05-01 DIAGNOSIS — R5381 Other malaise: Secondary | ICD-10-CM | POA: Insufficient documentation

## 2018-05-01 DIAGNOSIS — I10 Essential (primary) hypertension: Secondary | ICD-10-CM | POA: Insufficient documentation

## 2018-05-01 DIAGNOSIS — H9319 Tinnitus, unspecified ear: Secondary | ICD-10-CM | POA: Insufficient documentation

## 2018-05-01 DIAGNOSIS — H9313 Tinnitus, bilateral: Secondary | ICD-10-CM

## 2018-05-01 DIAGNOSIS — Z79899 Other long term (current) drug therapy: Secondary | ICD-10-CM

## 2018-05-01 DIAGNOSIS — G629 Polyneuropathy, unspecified: Secondary | ICD-10-CM

## 2018-05-01 DIAGNOSIS — R61 Generalized hyperhidrosis: Secondary | ICD-10-CM | POA: Insufficient documentation

## 2018-05-01 DIAGNOSIS — M791 Myalgia, unspecified site: Secondary | ICD-10-CM | POA: Diagnosis not present

## 2018-05-01 DIAGNOSIS — Z9484 Stem cells transplant status: Secondary | ICD-10-CM | POA: Diagnosis not present

## 2018-05-01 DIAGNOSIS — D701 Agranulocytosis secondary to cancer chemotherapy: Secondary | ICD-10-CM | POA: Diagnosis not present

## 2018-05-01 DIAGNOSIS — G479 Sleep disorder, unspecified: Secondary | ICD-10-CM | POA: Diagnosis not present

## 2018-05-01 DIAGNOSIS — K219 Gastro-esophageal reflux disease without esophagitis: Secondary | ICD-10-CM | POA: Insufficient documentation

## 2018-05-01 DIAGNOSIS — Z96611 Presence of right artificial shoulder joint: Secondary | ICD-10-CM | POA: Insufficient documentation

## 2018-05-01 DIAGNOSIS — Z87891 Personal history of nicotine dependence: Secondary | ICD-10-CM | POA: Insufficient documentation

## 2018-05-01 DIAGNOSIS — J069 Acute upper respiratory infection, unspecified: Secondary | ICD-10-CM | POA: Diagnosis not present

## 2018-05-01 DIAGNOSIS — Z9221 Personal history of antineoplastic chemotherapy: Secondary | ICD-10-CM

## 2018-05-01 DIAGNOSIS — R5383 Other fatigue: Secondary | ICD-10-CM

## 2018-05-01 DIAGNOSIS — D469 Myelodysplastic syndrome, unspecified: Secondary | ICD-10-CM

## 2018-05-01 DIAGNOSIS — T451X5S Adverse effect of antineoplastic and immunosuppressive drugs, sequela: Secondary | ICD-10-CM | POA: Insufficient documentation

## 2018-05-01 DIAGNOSIS — M8580 Other specified disorders of bone density and structure, unspecified site: Secondary | ICD-10-CM | POA: Insufficient documentation

## 2018-05-01 DIAGNOSIS — K5900 Constipation, unspecified: Secondary | ICD-10-CM

## 2018-05-01 DIAGNOSIS — Z5112 Encounter for antineoplastic immunotherapy: Secondary | ICD-10-CM | POA: Insufficient documentation

## 2018-05-01 DIAGNOSIS — E8809 Other disorders of plasma-protein metabolism, not elsewhere classified: Secondary | ICD-10-CM | POA: Insufficient documentation

## 2018-05-01 LAB — CMP (CANCER CENTER ONLY)
ALT: 13 U/L (ref 0–44)
AST: 13 U/L — ABNORMAL LOW (ref 15–41)
Albumin: 3.2 g/dL — ABNORMAL LOW (ref 3.5–5.0)
Alkaline Phosphatase: 77 U/L (ref 38–126)
Anion gap: 10 (ref 5–15)
BUN: 14 mg/dL (ref 8–23)
CO2: 25 mmol/L (ref 22–32)
Calcium: 9.1 mg/dL (ref 8.9–10.3)
Chloride: 104 mmol/L (ref 98–111)
Creatinine: 0.74 mg/dL (ref 0.44–1.00)
GFR, Est AFR Am: >60 mL/min (ref >60)
GFR, Estimated: >60 mL/min (ref >60)
Glucose, Bld: 82 mg/dL (ref 70–99)
Potassium: 4.1 mmol/L (ref 3.5–5.1)
Sodium: 139 mmol/L (ref 135–145)
Total Bilirubin: <0.2 mg/dL — ABNORMAL LOW (ref 0.3–1.2)
Total Protein: 6.2 g/dL — ABNORMAL LOW (ref 6.5–8.1)

## 2018-05-01 LAB — CBC WITH DIFFERENTIAL (CANCER CENTER ONLY)
Abs Immature Granulocytes: 0.01 10*3/uL (ref 0.00–0.07)
Basophils Absolute: 0 10*3/uL (ref 0.0–0.1)
Basophils Relative: 0 %
Eosinophils Absolute: 0.1 10*3/uL (ref 0.0–0.5)
Eosinophils Relative: 1 %
HCT: 36.7 % (ref 36.0–46.0)
Hemoglobin: 12 g/dL (ref 12.0–15.0)
Immature Granulocytes: 0 %
Lymphocytes Relative: 53 %
Lymphs Abs: 3.7 10*3/uL (ref 0.7–4.0)
MCH: 35.6 pg — ABNORMAL HIGH (ref 26.0–34.0)
MCHC: 32.7 g/dL (ref 30.0–36.0)
MCV: 108.9 fL — ABNORMAL HIGH (ref 80.0–100.0)
Monocytes Absolute: 1.7 10*3/uL — ABNORMAL HIGH (ref 0.1–1.0)
Monocytes Relative: 24 %
Neutro Abs: 1.6 10*3/uL — ABNORMAL LOW (ref 1.7–7.7)
Neutrophils Relative %: 22 %
Platelet Count: 284 10*3/uL (ref 150–400)
RBC: 3.37 MIL/uL — ABNORMAL LOW (ref 3.87–5.11)
RDW: 15.6 % — ABNORMAL HIGH (ref 11.5–15.5)
WBC Count: 7 10*3/uL (ref 4.0–10.5)
nRBC: 0 % (ref 0.0–0.2)

## 2018-05-01 MED ORDER — PROCHLORPERAZINE MALEATE 10 MG PO TABS
ORAL_TABLET | ORAL | Status: AC
  Filled 2018-05-01: qty 1

## 2018-05-01 MED ORDER — DIPHENHYDRAMINE HCL 25 MG PO CAPS
ORAL_CAPSULE | ORAL | Status: AC
  Filled 2018-05-01: qty 2

## 2018-05-01 MED ORDER — PROCHLORPERAZINE MALEATE 10 MG PO TABS
10.0000 mg | ORAL_TABLET | Freq: Once | ORAL | Status: AC
  Administered 2018-05-01: 10 mg via ORAL

## 2018-05-01 MED ORDER — HEPARIN SOD (PORK) LOCK FLUSH 100 UNIT/ML IV SOLN
500.0000 [IU] | Freq: Once | INTRAVENOUS | Status: AC | PRN
  Administered 2018-05-01: 500 [IU]
  Filled 2018-05-01: qty 5

## 2018-05-01 MED ORDER — DIPHENHYDRAMINE HCL 25 MG PO CAPS
50.0000 mg | ORAL_CAPSULE | Freq: Once | ORAL | Status: AC
  Administered 2018-05-01: 50 mg via ORAL

## 2018-05-01 MED ORDER — SODIUM CHLORIDE 0.9% FLUSH
10.0000 mL | Freq: Once | INTRAVENOUS | Status: AC
  Administered 2018-05-01: 10 mL
  Filled 2018-05-01: qty 10

## 2018-05-01 MED ORDER — SODIUM CHLORIDE 0.9 % IV SOLN
16.0000 mg/kg | Freq: Once | INTRAVENOUS | Status: AC
  Administered 2018-05-01: 1200 mg via INTRAVENOUS
  Filled 2018-05-01: qty 60

## 2018-05-01 MED ORDER — SODIUM CHLORIDE 0.9% FLUSH
10.0000 mL | INTRAVENOUS | Status: DC | PRN
  Administered 2018-05-01: 10 mL
  Filled 2018-05-01: qty 10

## 2018-05-01 MED ORDER — SODIUM CHLORIDE 0.9 % IV SOLN
20.0000 mg | Freq: Once | INTRAVENOUS | Status: AC
  Administered 2018-05-01: 20 mg via INTRAVENOUS
  Filled 2018-05-01: qty 2

## 2018-05-01 MED ORDER — ACETAMINOPHEN 325 MG PO TABS
ORAL_TABLET | ORAL | Status: AC
  Filled 2018-05-01: qty 2

## 2018-05-01 MED ORDER — SODIUM CHLORIDE 0.9 % IV SOLN
Freq: Once | INTRAVENOUS | Status: AC
  Administered 2018-05-01: 10:00:00 via INTRAVENOUS
  Filled 2018-05-01: qty 250

## 2018-05-01 MED ORDER — ACETAMINOPHEN 325 MG PO TABS
650.0000 mg | ORAL_TABLET | Freq: Once | ORAL | Status: AC
  Administered 2018-05-01: 650 mg via ORAL

## 2018-05-01 MED ORDER — DIPHENHYDRAMINE HCL 50 MG/ML IJ SOLN
INTRAMUSCULAR | Status: AC
  Filled 2018-05-01: qty 1

## 2018-05-01 NOTE — Patient Instructions (Signed)

## 2018-05-01 NOTE — Patient Instructions (Signed)
[facility] Discharge Instructions for Patients Receiving Chemotherapy  Today you received the following chemotherapy agents: Darzalex  To help prevent nausea and vomiting after your treatment, we encourage you to take your nausea medication as directed.    If you develop nausea and vomiting that is not controlled by your nausea medication, call the clinic.   BELOW ARE SYMPTOMS THAT SHOULD BE REPORTED IMMEDIATELY:  *FEVER GREATER THAN 100.5 F  *CHILLS WITH OR WITHOUT FEVER  NAUSEA AND VOMITING THAT IS NOT CONTROLLED WITH YOUR NAUSEA MEDICATION  *UNUSUAL SHORTNESS OF BREATH  *UNUSUAL BRUISING OR BLEEDING  TENDERNESS IN MOUTH AND THROAT WITH OR WITHOUT PRESENCE OF ULCERS  *URINARY PROBLEMS  *BOWEL PROBLEMS  UNUSUAL RASH Items with * indicate a potential emergency and should be followed up as soon as possible.  Feel free to call the clinic should you have any questions or concerns. The clinic phone number is [phone].  Please show the CHEMO ALERT CARD at check-in to the Emergency Department and triage nurse.   

## 2018-05-01 NOTE — Progress Notes (Signed)
[name] Follow up:    [name], [name], MD [place] [address]   DIAGNOSIS: Cancer Staging Multiple myeloma [facility]) Staging form: Multiple Myeloma, AJCC 6th Edition - Clinical: Stage IIA - Signed by [name], MD on 11/07/2013   SUMMARY OF ONCOLOGIC HISTORY: Oncology History   Multiple myeloma, Ig A Lambda, M spike 3.54 grams, Calcium 9.2, Creatinine 0.8, Beta 2 microglobulin 4.52, IgA 4840 mg/dL, lambda light chain 75.4, albumin 3.6, hemoglobin 9.7, platelet 115    Primary site: Multiple Myeloma   Staging method: AJCC 6th Edition   Clinical: Stage IIA signed by [name], MD on 11/07/2013  2:46 PM   Summary: Stage IIA        Multiple myeloma ([name])   10/31/2013 Bone Marrow Biopsy    Bone marrow biopsy confirmed multiple myeloma with 40% bone marrow involvement. Skeletal survey showed minimal lesions in her score with generalized demineralization    11/10/2013 - 02/13/2014 Chemotherapy    The patient is started on induction chemotherapy with weekly dexamethasone 40 mg by mouth as well as Velcade subcutaneous injection on days 1, 4, 8 and 11. On 11/21/2013, she was started on monthly Zometa.    12/23/2013 Adverse Reaction    The dose of Velcade was reduced due to thrombocytopenia.    01/28/2014 - 04/07/2014 Chemotherapy    Revlimid is added. Treatment was discontinued due to lack of response.    02/24/2014 - 04/07/2014 Chemotherapy    Due to worsening peripheral neuropathy, Velcade injection is changed to once a week. Revlimid was given 21 days on, 7 days off.    04/07/2014 - 04/10/2014 Chemotherapy    Revlimid was discontinued due to lack of response. Chemotherapy was changed back to Velcade injection twice a week, 2 weeks on 1 week off. Her treatment was switched to to minimum response    04/20/2014 - 06/02/2014 Chemotherapy    chemotherapy is switched to Carfilzomib, Cytoxan and dexamethasone.    04/22/2014 Procedure    she has  placement of port for chemotherapy.    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 at [facility]    10/20/2014 - 03/24/2015 Chemotherapy     she received chemotherapy with Kyprolis, Revlimid and dexamethasone    10/22/2014 Procedure    She has port placement    01/19/2015 Tumor Marker    IgA lambda M spike at 0.4 g     01/20/2015 Miscellaneous    IVIG monthly was added for recurrent infections    02/02/2015 Miscellaneous    She received GCSF for severe neutropenia    02/26/2015 Bone Marrow Biopsy     she had bone marrow biopsy done at [facility] which showed mild pancytopenia but not diagnostic for myelodysplastic syndrome or multiple myeloma    07/22/2015 - 09/21/2015 Chemotherapy    She is receiving Daratumumab at [place] due to relapsed myeloma    08/03/2015 - 08/06/2015 Hospital Admission    She was admitted to the hospital for neutropenic fever. No cource was found and fever resolved with  IV vancomycin and meropenem    09/13/2015 Bone Marrow Biopsy    Bone marrow biopsy showed no increased blasts, 3-4 % plasma cells    03/02/2016 Bone Marrow Biopsy    Bone marrow biopsy at [facility] showed normocellular (30%) bone marrow with trilineage hematopoiesis. No significant increase in blasts. No significant increase in plasma cells.    05/12/2016 Imaging    DEXA scan at [place] showed osteopenia    10/24/2016 Imaging    Skeletal survey at [facility], no new lesions    12/07/2017 Imaging    No focal abnormality noted to suggest myeloma. Exam is stable from prior exam.    02/20/2018 -  Chemotherapy    The patient had daratumumab  (DARZALEX) 1,200 mg in sodium chloride 0.9 % 940 mL (1.2 mg/mL) chemo infusion, 16 mg/kg = 1,200 mg, Intravenous, Once, 1 of 1 cycle Administration: 1,200 mg (03/06/2018) daratumumab (DARZALEX) 1,200 mg in sodium chloride 0.9 % 440 mL (2.4 mg/mL) chemo infusion, 16 mg/kg = 1,200 mg, Intravenous, Once, 1 of 1 cycle Administration: 1,200 mg (03/13/2018), 1,200 mg (03/20/2018) daratumumab (DARZALEX) 1,200 mg in sodium chloride 0.9 % 440 mL chemo infusion, 16 mg/kg = 1,200 mg, Intravenous, Once, 3 of 7 cycles Administration: 1,200 mg (03/28/2018), 1,200 mg (04/03/2018), 1,200 mg (04/10/2018), 1,200 mg (04/17/2018), 1,200 mg (04/24/2018), 1,200 mg (05/01/2018)  for chemotherapy treatment.     03/01/2018 Procedure    Successful 8 French right internal jugular vein power port placement with its tip at the SVC/RA junction.     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[facility])   04/06/2015 Bone Marrow Biopsy    Accession: [id number] BM biopsy showed RAEB-1    04/06/2015 Tumor Marker    Cytogenetics and FISH for MDS are within normal limits    10/06/2015 - 10/10/2015 Chemotherapy    She received conditioning chemotherapy with busulfan and melphalan    10/12/2015 Bone Marrow Transplant    She received allogenic stem cell transplant    10/19/2015 Adverse Reaction    She developed posttransplant complication with mucositis, viral infection with rhinovirus, neutropenic fever, bilateral pleural effusion and moderate pericardial effusion and CMV reactivation.    10/31/2015 Miscellaneous    She has engrafted     CURRENT THERAPY: Daratumumab every other week, Pomalyst 21 days on/7 days off, Zometa every three months (last on 04/03/18)  INTERVAL HISTORY: Jean Davidson 72 y.o. female returns for evaluation of her multiple myeloma prior to receiving Daratumumab.  She continues on Pomalyst and starts her 21 day cycle today, a full dose Aspirin.  She was taking Dexamethasone 52m per week and was having  difficulty with sleeping, so she was instructed to taper the dexamethasone.  This past Monday she received one tablet, and will have stopped by next week.  She is anxious to see her myeloma panel, and was very happy with her recent results.    DNicoyanotes some fatigue.  She wakes up sometimes in the middle of the night.  She has some achiness in her legs and biceps.  She notes intermittent constipation. She takes colace regularly.  Sometimes she is uncomfortable because of this.  She take gasx.  DJayleanhas a prolapsed bladder which intermittently is bothersome.  She will occasionally experience night sweats.  She sometimes notes a low grade fever and achiness, and this improves with Tylenol.  She continues to have ringing in her ears.  She reviewed this with Dr. GAlvy Bimlerat her last visit, and was offered ENT referral at that time.  She would like an ENT referral at this point.  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  This was present prior to her treatment, when she was undergoing her other treatments, and had resolved.  With the colder weather she has noted this more, worse in her inner toe digits.     Patient Active Problem List   Diagnosis Date Noted  . Tinnitus of both ears 04/10/2018  . Preventive measure 04/10/2018  . Chills with fever 03/20/2018  . Goals of care, counseling/discussion 02/20/2018  . Pain of left scapula 12/06/2017  . Vitamin D deficiency 11/12/2017  . Yeast infection of the skin 09/06/2017  . Family hx of colon cancer requiring screening colonoscopy 01/17/2017  . Cystocele and rectocele with incomplete uterovaginal prolapse 11/22/2016  . 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 Bilateral pleural effusion 01/25/2016  . Pericardial effusion 01/24/2016  . Physical deconditioning 01/11/2016  . 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 Therapeutic drug monitoring 11/08/2015  . Red blood  cell antibody positive, compatible PRBC difficult to obtain 07/30/2015  . 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 Fatigue 01/26/2015  . Essential hypertension 01/26/2015  . Other fatigue 01/26/2015  . 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 Chronic recurrent sinusitis 10/23/2014  . Conjunctivitis 10/23/2014  . 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 Patient in clinical research study 07/21/2014  . Need for prophylactic vaccination and inoculation against influenza 02/24/2014  . Leukocytosis, unspecified 02/03/2014  . Constipation 11/21/2013  . Rash 11/21/2013  . Back pain 11/21/2013  . Anxiety 11/21/2013  . Multiple myeloma ([place]) 11/07/2013  . Thrombocytopenia ([place]) 10/29/2013  . Persistent cough for 3 weeks or longer 10/02/2013  . Abnormal weight loss 10/02/2013  . Normocytic anemia 08/22/2013  . Abdominal bloating 08/22/2013  . Hypersensitivity 08/07/2013  . Other malaise and fatigue 08/07/2013  . Osteopenia 08/05/2013  . GERD (gastroesophageal reflux disease) 01/16/2013  . Insomnia 01/16/2013  . Carotid stenosis   . Cervical disc disease   . Hypertension 04/15/2012  . Palpitations 03/29/2012    has No Known Allergies.  MEDICAL HISTORY: Past Medical History:  Diagnosis Date  . Anemia   . Anxiety   . Blood transfusion without reported diagnosis   . Cancer ([place])    remission for over 1 year  . Carotid stenosis    Mild bilateral  . Carpal tunnel syndrome   . Cervical disc disease    c7  . Cough 07/27/2015  . Disc degeneration   . Dysuria 07/26/2015  . Failure of stem cell transplant ([place])   . Fatigue 01/26/2015  . Fever 07/27/2015  . GERD (gastroesophageal reflux disease)   . 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 Hypertension    Borderline.  . Hypokalemia 07/16/2015  . Internal and external hemorrhoids without complication   . Pinched nerve   . Sinusitis, bacterial 07/27/2015  . Spinal  stenosis in cervical region   . Varicose veins of lower extremities with inflammation     SURGICAL HISTORY: Past Surgical History:  Procedure Laterality Date  . bladder tact  1988/1989   x2  . COLONOSCOPY    . IR IMAGING GUIDED PORT INSERTION  03/01/2018  . PORT-A-CATH REMOVAL    . SHOULDER SURGERY Right 04/06/13   right    SOCIAL HISTORY: Social History   Socioeconomic History  . Marital status: Divorced    Spouse name: Not on file  . Number of children:  Not on file  . Years of education: Not on file  . Highest education level: Not on file  Occupational History  . Not on file  Social Needs  . Financial resource strain: Not on file  . Food insecurity:    Worry: Not on file    Inability: Not on file  . Transportation needs:    Medical: Not on file    Non-medical: Not on file  Tobacco Use  . Smoking status: Former Smoker    Packs/day: 0.50    Years: 4.00    Pack years: 2.00    Last attempt to quit: 06/26/1968    Years since quitting: 49.8  . Smokeless tobacco: Never Used  Substance and Sexual Activity  . Alcohol use: Yes    Alcohol/week: 1.0 standard drinks    Types: 1 Cans of beer per week    Comment: 1 per week per pt  . Drug use: No  . Sexual activity: Not on file  Lifestyle  . Physical activity:    Days per week: Not on file    Minutes per session: Not on file  . Stress: Not on file  Relationships  . Social connections:    Talks on phone: Not on file    Gets together: Not on file    Attends religious service: Not on file    Active member of club or organization: Not on file    Attends meetings of clubs or organizations: Not on file    Relationship status: Not on file  . Intimate partner violence:    Fear of current or ex partner: Not on file    Emotionally abused: Not on file    Physically abused: Not on file    Forced sexual activity: Not on file  Other Topics Concern  . Not on file  Social History Narrative   Chauncey Reading is daughter tametha, banning will,  full code ( reviewed 67)    FAMILY HISTORY: Family History  Problem Relation Age of Onset  . Heart disease Mother   . Heart failure Father   . Heart disease Father   . Throat cancer Father   . Skin cancer Father   . Diabetes Father   . Kidney disease Father   . Hypertension Brother   . Heart disease Brother        congenital shunt, ?   . Colon cancer Paternal Aunt 3  . Skin cancer Brother   . Stomach cancer Neg Hx     Review of Systems  Constitutional: Positive for fatigue. Negative for appetite change, chills and unexpected weight change.  HENT:   Positive for tinnitus.   Eyes: Negative for eye problems and icterus.  Respiratory: Negative for chest tightness, cough and shortness of breath.   Cardiovascular: Negative for chest pain, leg swelling and palpitations.  Gastrointestinal: Positive for constipation. Negative for abdominal distention, abdominal pain, diarrhea, nausea and vomiting.  Endocrine: Negative for hot flashes.  Genitourinary: Negative for difficulty urinating.   Musculoskeletal: Positive for arthralgias.  Skin: Negative for itching and rash.  Neurological: Positive for numbness. Negative for dizziness, extremity weakness and headaches.  Hematological: Negative for adenopathy. Does not bruise/bleed easily.  Psychiatric/Behavioral: Negative for depression. The patient is not nervous/anxious.       PHYSICAL EXAMINATION  ECOG PERFORMANCE STATUS: 1 - Symptomatic but completely ambulatory  Vitals:   05/01/18 0827  BP: 135/69  Pulse: 73  Resp: 18  Temp: 98.6 F (37 C)  SpO2: 100%  Physical Exam  Constitutional: She is oriented to person, place, and time. She appears well-developed and well-nourished.  HENT:  Head: Normocephalic and atraumatic.  Mouth/Throat: Oropharynx is clear and moist. No oropharyngeal exudate.  Eyes: No scleral icterus.  Neck: Neck supple.  Cardiovascular: Normal rate, regular rhythm and normal heart  sounds.  Pulmonary/Chest: Effort normal and breath sounds normal.  Abdominal: Soft. Bowel sounds are normal. She exhibits no distension and no mass. There is no tenderness. There is no guarding. No hernia.  Musculoskeletal: She exhibits no edema.  Lymphadenopathy:    She has no cervical adenopathy.  Neurological: She is alert and oriented to person, place, and time.  Skin: Skin is warm and dry. Capillary refill takes less than 2 seconds.  Psychiatric: She has a normal mood and affect.    LABORATORY DATA:  CBC    Component Value Date/Time   WBC 7.0 05/01/2018 0815   WBC 12.0 (H) 03/09/2018 0612   RBC 3.37 (L) 05/01/2018 0815   HGB 12.0 05/01/2018 0815   HGB 12.5 01/04/2017 0902   HCT 36.7 05/01/2018 0815   HCT 36.5 01/04/2017 0902   PLT 284 05/01/2018 0815   PLT 159 01/04/2017 0902   MCV 108.9 (H) 05/01/2018 0815   MCV 103.1 (H) 01/04/2017 0902   MCH 35.6 (H) 05/01/2018 0815   MCHC 32.7 05/01/2018 0815   RDW 15.6 (H) 05/01/2018 0815   RDW 12.2 01/04/2017 0902   LYMPHSABS 3.7 05/01/2018 0815   LYMPHSABS 2.2 01/04/2017 0902   MONOABS 1.7 (H) 05/01/2018 0815   MONOABS 0.3 01/04/2017 0902   EOSABS 0.1 05/01/2018 0815   EOSABS 0.0 01/04/2017 0902   BASOSABS 0.0 05/01/2018 0815   BASOSABS 0.0 01/04/2017 0902    CMP     Component Value Date/Time   NA 139 05/01/2018 0815   NA 142 01/04/2017 0902   K 4.1 05/01/2018 0815   K 3.9 01/04/2017 0902   CL 104 05/01/2018 0815   CO2 25 05/01/2018 0815   CO2 28 01/04/2017 0902   GLUCOSE 82 05/01/2018 0815   GLUCOSE 92 01/04/2017 0902   BUN 14 05/01/2018 0815   BUN 9.5 01/04/2017 0902   CREATININE 0.74 05/01/2018 0815   CREATININE 0.7 01/04/2017 0902   CALCIUM 9.1 05/01/2018 0815   CALCIUM 9.0 01/04/2017 0902   PROT 6.2 (L) 05/01/2018 0815   PROT 6.0 (L) 01/04/2017 0902   PROT 5.8 (L) 01/04/2017 0902   ALBUMIN 3.2 (L) 05/01/2018 0815   ALBUMIN 3.4 (L) 01/04/2017 0902   AST 13 (L) 05/01/2018 0815   AST 21 01/04/2017 0902    ALT 13 05/01/2018 0815   ALT 16 01/04/2017 0902   ALKPHOS 77 05/01/2018 0815   ALKPHOS 101 01/04/2017 0902   BILITOT <0.2 (L) 05/01/2018 0815   BILITOT 0.56 01/04/2017 0902   GFRNONAA >60 05/01/2018 0815   GFRAA >60 05/01/2018 0815       ASSESSMENT and PLAN:   Multiple myeloma ([facility]) Loria is tolerating her treatment well.  She will continue on Pomalyst 21 days on and 7 days off and will also continue on Daratumumab every other week.  She is tolerating this well.  She will return in 2 weeks for Daratumumab and in 4 weeks for f/u with Dr. Alvy Bimler and Daratumumab.  I will check with Dr. Alvy Bimler when she returns about when she wants to run a myeloma panel again.  I reviewed this with Sergio and she understands this.  I also referred her to ENT today  for her tinnitus.     Orders Placed This Encounter  Procedures  . Ambulatory referral to ENT    Referral Priority:   Urgent    Referral Type:   Consultation    Referral Reason:   Specialty Services Required    Referred to Provider:   Izora Gala, MD    Requested Specialty:   Otolaryngology    Number of Visits Requested:   1    All questions were answered. The patient knows to call the clinic with any problems, questions or concerns. We can certainly see the patient much sooner if necessary.  A total of (30) minutes of face-to-face time was spent with this patient with greater than 50% of that time in counseling and care-coordination.  This note was electronically signed. Scot Dock, NP 05/02/2018

## 2018-05-02 DIAGNOSIS — D2262 Melanocytic nevi of left upper limb, including shoulder: Secondary | ICD-10-CM | POA: Diagnosis not present

## 2018-05-02 DIAGNOSIS — Z23 Encounter for immunization: Secondary | ICD-10-CM | POA: Diagnosis not present

## 2018-05-02 DIAGNOSIS — B078 Other viral warts: Secondary | ICD-10-CM | POA: Diagnosis not present

## 2018-05-02 DIAGNOSIS — L309 Dermatitis, unspecified: Secondary | ICD-10-CM | POA: Diagnosis not present

## 2018-05-02 DIAGNOSIS — L940 Localized scleroderma [morphea]: Secondary | ICD-10-CM | POA: Diagnosis not present

## 2018-05-02 DIAGNOSIS — L590 Erythema ab igne [dermatitis ab igne]: Secondary | ICD-10-CM | POA: Diagnosis not present

## 2018-05-02 DIAGNOSIS — D225 Melanocytic nevi of trunk: Secondary | ICD-10-CM | POA: Diagnosis not present

## 2018-05-02 DIAGNOSIS — L658 Other specified nonscarring hair loss: Secondary | ICD-10-CM | POA: Diagnosis not present

## 2018-05-02 NOTE — Assessment & Plan Note (Addendum)
Jean Davidson is tolerating her treatment well.  She will continue on Pomalyst 21 days on and 7 days off and will also continue on Daratumumab every other week.  She is tolerating this well.  She will return in 2 weeks for Daratumumab and in 4 weeks for f/u with Dr. Alvy Bimler and Daratumumab.  I will check with Dr. Alvy Bimler when she returns about when she wants to run a myeloma panel again.  I reviewed this with [name] and she understands this.  I also referred her to ENT today for her tinnitus.

## 2018-05-07 ENCOUNTER — Encounter: Payer: Self-pay | Admitting: Adult Health

## 2018-05-08 ENCOUNTER — Inpatient Hospital Stay: Payer: Medicare HMO

## 2018-05-08 ENCOUNTER — Ambulatory Visit (HOSPITAL_COMMUNITY)
Admission: RE | Admit: 2018-05-08 | Discharge: 2018-05-08 | Disposition: A | Discharge status: Home / Self Care | Payer: Medicare HMO | Source: Ambulatory Visit | Attending: Medical | Admitting: Medical

## 2018-05-08 ENCOUNTER — Other Ambulatory Visit: Payer: Self-pay | Admitting: *Deleted

## 2018-05-08 ENCOUNTER — Inpatient Hospital Stay (HOSPITAL_BASED_OUTPATIENT_CLINIC_OR_DEPARTMENT_OTHER): Payer: Medicare HMO | Admitting: Medical

## 2018-05-08 ENCOUNTER — Telehealth: Payer: Self-pay | Admitting: *Deleted

## 2018-05-08 VITALS — BP 131/82 | HR 101 | Temp 99.2°F | Resp 19 | Ht 63.0 in | Wt 166.7 lb

## 2018-05-08 DIAGNOSIS — G629 Polyneuropathy, unspecified: Secondary | ICD-10-CM

## 2018-05-08 DIAGNOSIS — J069 Acute upper respiratory infection, unspecified: Secondary | ICD-10-CM

## 2018-05-08 DIAGNOSIS — R059 Cough, unspecified: Secondary | ICD-10-CM

## 2018-05-08 DIAGNOSIS — R05 Cough: Secondary | ICD-10-CM | POA: Diagnosis not present

## 2018-05-08 DIAGNOSIS — R61 Generalized hyperhidrosis: Secondary | ICD-10-CM | POA: Diagnosis not present

## 2018-05-08 DIAGNOSIS — D469 Myelodysplastic syndrome, unspecified: Secondary | ICD-10-CM

## 2018-05-08 DIAGNOSIS — Z9221 Personal history of antineoplastic chemotherapy: Secondary | ICD-10-CM | POA: Diagnosis not present

## 2018-05-08 DIAGNOSIS — Z96611 Presence of right artificial shoulder joint: Secondary | ICD-10-CM

## 2018-05-08 DIAGNOSIS — I10 Essential (primary) hypertension: Secondary | ICD-10-CM

## 2018-05-08 DIAGNOSIS — K5900 Constipation, unspecified: Secondary | ICD-10-CM

## 2018-05-08 DIAGNOSIS — R5383 Other fatigue: Secondary | ICD-10-CM | POA: Diagnosis not present

## 2018-05-08 DIAGNOSIS — Z79899 Other long term (current) drug therapy: Secondary | ICD-10-CM

## 2018-05-08 DIAGNOSIS — K219 Gastro-esophageal reflux disease without esophagitis: Secondary | ICD-10-CM

## 2018-05-08 DIAGNOSIS — R0602 Shortness of breath: Secondary | ICD-10-CM | POA: Diagnosis not present

## 2018-05-08 DIAGNOSIS — Z9484 Stem cells transplant status: Secondary | ICD-10-CM | POA: Diagnosis not present

## 2018-05-08 DIAGNOSIS — C9000 Multiple myeloma not having achieved remission: Secondary | ICD-10-CM | POA: Diagnosis not present

## 2018-05-08 DIAGNOSIS — Z87891 Personal history of nicotine dependence: Secondary | ICD-10-CM

## 2018-05-08 DIAGNOSIS — H9319 Tinnitus, unspecified ear: Secondary | ICD-10-CM

## 2018-05-08 DIAGNOSIS — C9002 Multiple myeloma in relapse: Secondary | ICD-10-CM | POA: Diagnosis not present

## 2018-05-08 DIAGNOSIS — M8580 Other specified disorders of bone density and structure, unspecified site: Secondary | ICD-10-CM

## 2018-05-08 DIAGNOSIS — Z5112 Encounter for antineoplastic immunotherapy: Secondary | ICD-10-CM | POA: Diagnosis not present

## 2018-05-08 LAB — CBC WITH DIFFERENTIAL (CANCER CENTER ONLY)
Abs Immature Granulocytes: 0.02 K/uL (ref 0.00–0.07)
Basophils Absolute: 0 K/uL (ref 0.0–0.1)
Basophils Relative: 1 %
Eosinophils Absolute: 0.3 K/uL (ref 0.0–0.5)
Eosinophils Relative: 4 %
HCT: 36.3 % (ref 36.0–46.0)
Hemoglobin: 12.1 g/dL (ref 12.0–15.0)
Immature Granulocytes: 0 %
Lymphocytes Relative: 24 %
Lymphs Abs: 1.7 K/uL (ref 0.7–4.0)
MCH: 36 pg — ABNORMAL HIGH (ref 26.0–34.0)
MCHC: 33.3 g/dL (ref 30.0–36.0)
MCV: 108 fL — ABNORMAL HIGH (ref 80.0–100.0)
Monocytes Absolute: 0.6 K/uL (ref 0.1–1.0)
Monocytes Relative: 9 %
Neutro Abs: 4.5 K/uL (ref 1.7–7.7)
Neutrophils Relative %: 62 %
Platelet Count: 268 K/uL (ref 150–400)
RBC: 3.36 MIL/uL — ABNORMAL LOW (ref 3.87–5.11)
RDW: 15.1 % (ref 11.5–15.5)
WBC Count: 7.2 K/uL (ref 4.0–10.5)
nRBC: 0 % (ref 0.0–0.2)

## 2018-05-08 LAB — CMP (CANCER CENTER ONLY)
ALT: 10 U/L (ref 0–44)
AST: 12 U/L — ABNORMAL LOW (ref 15–41)
Albumin: 2.9 g/dL — ABNORMAL LOW (ref 3.5–5.0)
Alkaline Phosphatase: 95 U/L (ref 38–126)
Anion gap: 10 (ref 5–15)
BUN: 18 mg/dL (ref 8–23)
CO2: 26 mmol/L (ref 22–32)
Calcium: 9.2 mg/dL (ref 8.9–10.3)
Chloride: 103 mmol/L (ref 98–111)
Creatinine: 0.75 mg/dL (ref 0.44–1.00)
GFR, Est AFR Am: >60 mL/min (ref >60)
GFR, Estimated: >60 mL/min (ref >60)
Glucose, Bld: 123 mg/dL — ABNORMAL HIGH (ref 70–99)
Potassium: 4 mmol/L (ref 3.5–5.1)
Sodium: 139 mmol/L (ref 135–145)
Total Bilirubin: <0.2 mg/dL — ABNORMAL LOW (ref 0.3–1.2)
Total Protein: 6.3 g/dL — ABNORMAL LOW (ref 6.5–8.1)

## 2018-05-08 MED ORDER — SODIUM CHLORIDE 0.9% FLUSH
10.0000 mL | Freq: Once | INTRAVENOUS | Status: AC
  Administered 2018-05-08: 10 mL
  Filled 2018-05-08: qty 10

## 2018-05-08 MED ORDER — HEPARIN SOD (PORK) LOCK FLUSH 100 UNIT/ML IV SOLN
500.0000 [IU] | Freq: Once | INTRAVENOUS | Status: AC
  Administered 2018-05-08: 500 [IU]
  Filled 2018-05-08: qty 5

## 2018-05-08 MED ORDER — DOXYCYCLINE HYCLATE 100 MG PO TABS: 100.0000 mg | ORAL_TABLET | Freq: Two times a day (BID) | ORAL | 0 refills | Status: DC

## 2018-05-08 MED ORDER — HYDROCOD POLST-CPM POLST ER 10-8 MG/5ML PO SUER: 5.0000 mL | Freq: Two times a day (BID) | ORAL | 0 refills | Status: DC | PRN

## 2018-05-08 NOTE — Progress Notes (Signed)
Pt presents with sore throat (resolved), sinus pressure, fullness in ears, cough, congestion, drainage, and gen fatigue x1 wk.  Afebrile.  No rash.  Denies CP/SOB.

## 2018-05-08 NOTE — Progress Notes (Signed)
These results were called to Jeanett Schlein and were reviewed with her . Her were answered. She expressed understanding.

## 2018-05-08 NOTE — Telephone Encounter (Signed)
Received TC from patient stating that she has had several days of URI symptoms including fever, cough and sinus pain and pressure-especially on the right side.  She has tried mucinex, Afrin, robitussin and tylenol without much relief. She is currently receiving Darzalex every 2 weeks. This is her week off.  She would like to be seen.   High priority scheduling message sent for labs via port and [facility] for this afternoon.

## 2018-05-08 NOTE — Patient Instructions (Signed)
Implanted Port Home Guide An implanted port is a type of central line that is placed under the skin. Central lines are used to provide IV access when treatment or nutrition needs to be given through a person's veins. Implanted ports are used for long-term IV access. An implanted port may be placed because:  You need IV medicine that would be irritating to the small veins in your hands or arms.  You need long-term IV medicines, such as antibiotics.  You need IV nutrition for a long period.  You need frequent blood draws for lab tests.  You need dialysis.  Implanted ports are usually placed in the chest area, but they can also be placed in the upper arm, the abdomen, or the leg. An implanted port has two main parts:  Reservoir. The reservoir is round and will appear as a small, raised area under your skin. The reservoir is the part where a needle is inserted to give medicines or draw blood.  Catheter. The catheter is a thin, flexible tube that extends from the reservoir. The catheter is placed into a large vein. Medicine that is inserted into the reservoir goes into the catheter and then into the vein.  How will I care for my incision site? Do not get the incision site wet. Bathe or shower as directed by your health care provider. How is my port accessed? Special steps must be taken to access the port:  Before the port is accessed, a numbing cream can be placed on the skin. This helps numb the skin over the port site.  Your health care provider uses a sterile technique to access the port. ? Your health care provider must put on a mask and sterile gloves. ? The skin over your port is cleaned carefully with an antiseptic and allowed to dry. ? The port is gently pinched between sterile gloves, and a needle is inserted into the port.  Only "non-coring" port needles should be used to access the port. Once the port is accessed, a blood return should be checked. This helps ensure that the port  is in the vein and is not clogged.  If your port needs to remain accessed for a constant infusion, a clear (transparent) bandage will be placed over the needle site. The bandage and needle will need to be changed every week, or as directed by your health care provider.  Keep the bandage covering the needle clean and dry. Do not get it wet. Follow your health care provider's instructions on how to take a shower or bath while the port is accessed.  If your port does not need to stay accessed, no bandage is needed over the port.  What is flushing? Flushing helps keep the port from getting clogged. Follow your health care provider's instructions on how and when to flush the port. Ports are usually flushed with saline solution or a medicine called heparin. The need for flushing will depend on how the port is used.  If the port is used for intermittent medicines or blood draws, the port will need to be flushed: ? After medicines have been given. ? After blood has been drawn. ? As part of routine maintenance.  If a constant infusion is running, the port may not need to be flushed.  How long will my port stay implanted? The port can stay in for as long as your health care provider thinks it is needed. When it is time for the port to come out, surgery will be   done to remove it. The procedure is similar to the one performed when the port was put in. When should I seek immediate medical care? When you have an implanted port, you should seek immediate medical care if:  You notice a bad smell coming from the incision site.  You have swelling, redness, or drainage at the incision site.  You have more swelling or pain at the port site or the surrounding area.  You have a fever that is not controlled with medicine.  This information is not intended to replace advice given to you by your health care provider. Make sure you discuss any questions you have with your health care provider. Document  Released: 06/12/2005 Document Revised: 11/18/2015 Document Reviewed: 02/17/2013 Elsevier Interactive Patient Education  2017 Elsevier Inc.  

## 2018-05-10 ENCOUNTER — Other Ambulatory Visit: Payer: Self-pay

## 2018-05-10 DIAGNOSIS — C9002 Multiple myeloma in relapse: Secondary | ICD-10-CM

## 2018-05-10 MED ORDER — POMALIDOMIDE 2 MG PO CAPS: 2.0000 mg | ORAL_CAPSULE | Freq: Every day | ORAL | 0 refills | Status: DC

## 2018-05-13 NOTE — Progress Notes (Signed)
[facility]pt[facility] Management Clinic Progress Note   [name] [id number] [birth year] 72 y.o.  [name] is managed by Dr. [name]  Actively treated with chemotherapy/immunotherapy: yes  Current Therapy: Darzalex  Last Treated: 05/01/2018 (cycle 3, day 1)  Assessment: Plan:    Cough - Plan: DG Chest 1 View, chlorpheniramine-HYDROcodone (TUSSIONEX PENNKINETIC ER) 10-8 MG/5ML SUER  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[place]) - Plan: heparin lock flush 100 unit/mL, sodium chloride flush (NS) 0.9 % injection 10 mL  Multiple myeloma in relapse ([place]) - Plan: heparin lock flush 100 unit/mL, sodium chloride flush (NS) 0.9 % injection 10 mL  Tinnitus, unspecified laterality  Constipation, unspecified constipation type  Gastroesophageal reflux disease, esophagitis presence not specified  Upper respiratory tract infection, unspecified type - Plan: doxycycline (VIBRA-TABS) 100 MG tablet   Cough: The patient was referred for a chest x-ray which showed no acute abnormalities.  Patient was given a prescription for Tussionex.  Multiple myeloma and MDS/MPN: The patient continues to be followed by Dr. [name] and is status post cycle 3, day 1 of Darzalex which was dosed on 05/01/2018.  Tinnitus: The patient is pending a consult with ENT.    Constipation: The patient was instructed to increase Colace to 1 to 2 capsules twice daily.  GERD: Patient was instructed to increase her pantoprazole to twice daily dosing.  URI: Patient was given a prescription for doxycycline 100 mg p.o. twice daily x7 days.  Please see After Visit Summary for patient specific instructions.  Future Appointments  Date Time Provider [place]  05/15/2018  8:00 AM [facility] LAB 1 [facility] None  05/15/2018  8:15 AM [facility] INFUSION [facility] None  05/15/2018  9:15 AM [facility] INFUSION [facility] None  05/29/2018 10:00 AM [facility] LAB ONLY [facility] None  05/29/2018  10:15 AM [facility] [place] FLUSH [facility] None  05/29/2018 11:00 AM Gorsuch, Ni, MD [facility] None  05/29/2018 11:30 AM [facility] INFUSION [facility] None  06/12/2018  8:45 AM [facility] LAB 2 [facility] None  06/12/2018  9:00 AM [facility] [place] FLUSH [facility] None  06/12/2018 10:00 AM [facility] INFUSION [facility] None  09/05/2019  9:30 AM [facility] VASC 2 [facility] None  09/05/2019 10:30 AM Dew, Erskine Squibb, MD [facility] None    Orders Placed This Encounter  Procedures  . DG Chest 1 View       Subjective:   Patient ID:  Jean Davidson is a 72 y.o. (DOB [birth year]) female.  Chief Complaint:  Chief Complaint  Patient presents with  . URI    HPI Jean Davidson is a 72 year old female with a history of multiple myeloma who is treated with Darzalex under the direction of Dr. [name].  She presents to the office today with a one-week history of coughing, nausea, postnasal drainage, headaches, sinus pressure, fevers, chills, insomnia, constipation, overall fatigue, upper extremity weakness, sweats, shortness of breath with activity, GERD, and tinnitus.  She is awaiting a consultation with ENT regarding her tinnitus.  Medications: I have reviewed the patient's current medications.  Allergies: No Known Allergies  Past Medical History:  Diagnosis Date  . Anemia   . Anxiety   . Blood transfusion without reported diagnosis   . Cancer ([place])    remission for over 1 year  . Carotid stenosis    Mild bilateral  . Carpal tunnel syndrome   . Cervical disc disease    c7  . Cough 07/27/2015  . Disc degeneration   . Dysuria 07/26/2015  . Failure of stem cell transplant ([place])   .  Fatigue 01/26/2015  . Fever 07/27/2015  . GERD (gastroesophageal reflux disease)   . 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 Hypertension    Borderline.  . Hypokalemia 07/16/2015  . Internal and external hemorrhoids without complication   . Pinched nerve   . Sinusitis, bacterial 07/27/2015  . Spinal stenosis in  cervical region   . Varicose veins of lower extremities with inflammation     Past Surgical History:  Procedure Laterality Date  . bladder tact  1988/1989   x2  . COLONOSCOPY    . IR IMAGING GUIDED PORT INSERTION  03/01/2018  . PORT-A-CATH REMOVAL    . SHOULDER SURGERY Right 04/06/13   right    Family History  Problem Relation Age of Onset  . Heart disease Mother   . Heart failure Father   . Heart disease Father   . Throat cancer Father   . Skin cancer Father   . Diabetes Father   . Kidney disease Father   . Hypertension Brother   . Heart disease Brother        congenital shunt, ?   . Colon cancer Paternal Aunt 18  . Skin cancer Brother   . Stomach cancer Neg Hx     Social History   Socioeconomic History  . Marital status: Divorced    Spouse name: Not on file  . Number of children: Not on file  . Years of education: Not on file  . Highest education level: Not on file  Occupational History  . Not on file  Social Needs  . Financial resource strain: Not on file  . Food insecurity:    Worry: Not on file    Inability: Not on file  . Transportation needs:    Medical: Not on file    Non-medical: Not on file  Tobacco Use  . Smoking status: Former Smoker    Packs/day: 0.50    Years: 4.00    Pack years: 2.00    Last attempt to quit: 06/26/1968    Years since quitting: 49.9  . Smokeless tobacco: Never Used  Substance and Sexual Activity  . Alcohol use: Yes    Alcohol/week: 1.0 standard drinks    Types: 1 Cans of beer per week    Comment: 1 per week per pt  . Drug use: No  . Sexual activity: Not on file  Lifestyle  . Physical activity:    Days per week: Not on file    Minutes per session: Not on file  . Stress: Not on file  Relationships  . Social connections:    Talks on phone: Not on file    Gets together: Not on file    Attends religious service: Not on file    Active member of club or organization: Not on file    Attends meetings of clubs or  organizations: Not on file    Relationship status: Not on file  . Intimate partner violence:    Fear of current or ex partner: Not on file    Emotionally abused: Not on file    Physically abused: Not on file    Forced sexual activity: Not on file  Other Topics Concern  . Not on file  Social History Narrative   Chauncey Reading is daughter rebel, laughridge will,  full code ( reviewed 2015)    Past Medical History, Surgical history, Social history, and Family history were reviewed and updated as appropriate.   Please see review of systems for further details on the  patient's review from today.   Review of Systems:  Review of Systems  Constitutional: Positive for chills, fatigue and fever. Negative for appetite change, diaphoresis and unexpected weight change.  HENT: Positive for postnasal drip, sinus pressure and sinus pain. Negative for congestion, rhinorrhea and sore throat.   Respiratory: Positive for cough and shortness of breath. Negative for wheezing.   Cardiovascular: Negative for palpitations.  Gastrointestinal: Positive for constipation. Negative for abdominal distention, abdominal pain, anal bleeding, blood in stool, diarrhea, nausea, rectal pain and vomiting.  Neurological: Positive for headaches.    Objective:   Physical Exam:  BP 131/82 (BP Location: Left Arm, Patient Position: Sitting)   Pulse (!) 101   Temp 99.2 F (37.3 C) (Oral)   Resp 19   Ht '5\' 3"'  (1.6 m)   Wt 166 lb 11.2 oz (75.6 kg)   SpO2 100%   BMI 29.53 kg/m  ECOG: 0  Physical Exam  Constitutional: No distress.  HENT:  Head: Normocephalic and atraumatic.  Right Ear: External ear normal.  Left Ear: External ear normal.  Mouth/Throat: Oropharynx is clear and moist. No oropharyngeal exudate.  Neck: Normal range of motion. Neck supple.  Cardiovascular: Normal rate, regular rhythm and normal heart sounds. Exam reveals no gallop and no friction rub.  No murmur heard. Pulmonary/Chest: Effort normal and  breath sounds normal. No respiratory distress. She has no wheezes. She has no rales.  Lymphadenopathy:    She has no cervical adenopathy.  Neurological: She is alert. Coordination normal.  Skin: Skin is warm and dry. No rash noted. She is not diaphoretic. No erythema.  Psychiatric: She has a normal mood and affect. Her behavior is normal. Judgment and thought content normal.    Lab Review:     Component Value Date/Time   NA 139 05/08/2018 1435   NA 142 01/04/2017 0902   K 4.0 05/08/2018 1435   K 3.9 01/04/2017 0902   CL 103 05/08/2018 1435   CO2 26 05/08/2018 1435   CO2 28 01/04/2017 0902   GLUCOSE 123 (H) 05/08/2018 1435   GLUCOSE 92 01/04/2017 0902   BUN 18 05/08/2018 1435   BUN 9.5 01/04/2017 0902   CREATININE 0.75 05/08/2018 1435   CREATININE 0.7 01/04/2017 0902   CALCIUM 9.2 05/08/2018 1435   CALCIUM 9.0 01/04/2017 0902   PROT 6.3 (L) 05/08/2018 1435   PROT 6.0 (L) 01/04/2017 0902   PROT 5.8 (L) 01/04/2017 0902   ALBUMIN 2.9 (L) 05/08/2018 1435   ALBUMIN 3.4 (L) 01/04/2017 0902   AST 12 (L) 05/08/2018 1435   AST 21 01/04/2017 0902   ALT 10 05/08/2018 1435   ALT 16 01/04/2017 0902   ALKPHOS 95 05/08/2018 1435   ALKPHOS 101 01/04/2017 0902   BILITOT <0.2 (L) 05/08/2018 1435   BILITOT 0.56 01/04/2017 0902   GFRNONAA >60 05/08/2018 1435   GFRAA >60 05/08/2018 1435       Component Value Date/Time   WBC 7.2 05/08/2018 1435   WBC 12.0 (H) 03/09/2018 0612   RBC 3.36 (L) 05/08/2018 1435   HGB 12.1 05/08/2018 1435   HGB 12.5 01/04/2017 0902   HCT 36.3 05/08/2018 1435   HCT 36.5 01/04/2017 0902   PLT 268 05/08/2018 1435   PLT 159 01/04/2017 0902   MCV 108.0 (H) 05/08/2018 1435   MCV 103.1 (H) 01/04/2017 0902   MCH 36.0 (H) 05/08/2018 1435   MCHC 33.3 05/08/2018 1435   RDW 15.1 05/08/2018 1435   RDW 12.2 01/04/2017 0902   LYMPHSABS  1.7 05/08/2018 1435   LYMPHSABS 2.2 01/04/2017 0902   MONOABS 0.6 05/08/2018 1435   MONOABS 0.3 01/04/2017 0902   EOSABS 0.3  05/08/2018 1435   EOSABS 0.0 01/04/2017 0902   BASOSABS 0.0 05/08/2018 1435   BASOSABS 0.0 01/04/2017 0902   -------------------------------  Imaging from last 24 hours (if applicable):  Radiology interpretation: Dg Chest 1 View  Result Date: 05/08/2018 CLINICAL DATA:  Fever and cough. Shortness of breath. Multiple myeloma. EXAM: CHEST  1 VIEW COMPARISON:  03/09/2018 FINDINGS: Heart size and pulmonary vascularity are normal. No infiltrates or effusions. Tiny calcified granulomas at the right lung base laterally. Power port in place. Right total shoulder prosthesis. Moderate arthritic changes of the left shoulder. Lucency just above the diaphragm to the left of midline probably represents a hiatal hernia. IMPRESSION: No acute abnormalities. Electronically Signed   By: Lorriane Shire M.D.   On: 05/08/2018 16:41

## 2018-05-14 ENCOUNTER — Encounter: Payer: Self-pay | Admitting: Adult Health

## 2018-05-15 ENCOUNTER — Inpatient Hospital Stay: Payer: Medicare HMO

## 2018-05-15 VITALS — BP 108/70 | HR 78 | Temp 98.9°F | Resp 16

## 2018-05-15 DIAGNOSIS — Z5112 Encounter for antineoplastic immunotherapy: Secondary | ICD-10-CM | POA: Diagnosis not present

## 2018-05-15 DIAGNOSIS — C9002 Multiple myeloma in relapse: Secondary | ICD-10-CM

## 2018-05-15 LAB — CBC WITH DIFFERENTIAL (CANCER CENTER ONLY)
Abs Immature Granulocytes: 0.01 10*3/uL (ref 0.00–0.07)
Basophils Absolute: 0 10*3/uL (ref 0.0–0.1)
Basophils Relative: 1 %
Eosinophils Absolute: 1 10*3/uL — ABNORMAL HIGH (ref 0.0–0.5)
Eosinophils Relative: 15 %
HCT: 34.8 % — ABNORMAL LOW (ref 36.0–46.0)
Hemoglobin: 11.6 g/dL — ABNORMAL LOW (ref 12.0–15.0)
Immature Granulocytes: 0 %
Lymphocytes Relative: 36 %
Lymphs Abs: 2.4 10*3/uL (ref 0.7–4.0)
MCH: 36.1 pg — ABNORMAL HIGH (ref 26.0–34.0)
MCHC: 33.3 g/dL (ref 30.0–36.0)
MCV: 108.4 fL — ABNORMAL HIGH (ref 80.0–100.0)
Monocytes Absolute: 0.8 10*3/uL (ref 0.1–1.0)
Monocytes Relative: 12 %
Neutro Abs: 2.5 10*3/uL (ref 1.7–7.7)
Neutrophils Relative %: 36 %
Platelet Count: 251 10*3/uL (ref 150–400)
RBC: 3.21 MIL/uL — ABNORMAL LOW (ref 3.87–5.11)
RDW: 15.1 % (ref 11.5–15.5)
WBC Count: 6.7 10*3/uL (ref 4.0–10.5)
nRBC: 0 % (ref 0.0–0.2)

## 2018-05-15 LAB — CMP (CANCER CENTER ONLY)
ALT: 12 U/L (ref 0–44)
AST: 12 U/L — ABNORMAL LOW (ref 15–41)
Albumin: 2.9 g/dL — ABNORMAL LOW (ref 3.5–5.0)
Alkaline Phosphatase: 80 U/L (ref 38–126)
Anion gap: 8 (ref 5–15)
BUN: 15 mg/dL (ref 8–23)
CO2: 27 mmol/L (ref 22–32)
Calcium: 9 mg/dL (ref 8.9–10.3)
Chloride: 104 mmol/L (ref 98–111)
Creatinine: 0.73 mg/dL (ref 0.44–1.00)
GFR, Est AFR Am: >60 mL/min (ref >60)
GFR, Estimated: >60 mL/min (ref >60)
Glucose, Bld: 96 mg/dL (ref 70–99)
Potassium: 3.9 mmol/L (ref 3.5–5.1)
Sodium: 139 mmol/L (ref 135–145)
Total Bilirubin: 0.3 mg/dL (ref 0.3–1.2)
Total Protein: 6 g/dL — ABNORMAL LOW (ref 6.5–8.1)

## 2018-05-15 MED ORDER — SODIUM CHLORIDE 0.9 % IV SOLN
20.0000 mg | Freq: Once | INTRAVENOUS | Status: AC
  Administered 2018-05-15: 20 mg via INTRAVENOUS
  Filled 2018-05-15: qty 2

## 2018-05-15 MED ORDER — ACETAMINOPHEN 325 MG PO TABS
650.0000 mg | ORAL_TABLET | Freq: Once | ORAL | Status: AC
  Administered 2018-05-15: 650 mg via ORAL

## 2018-05-15 MED ORDER — PROCHLORPERAZINE MALEATE 10 MG PO TABS
ORAL_TABLET | ORAL | Status: AC
  Filled 2018-05-15: qty 1

## 2018-05-15 MED ORDER — ACETAMINOPHEN 325 MG PO TABS
ORAL_TABLET | ORAL | Status: AC
  Filled 2018-05-15: qty 2

## 2018-05-15 MED ORDER — DIPHENHYDRAMINE HCL 25 MG PO CAPS
50.0000 mg | ORAL_CAPSULE | Freq: Once | ORAL | Status: AC
  Administered 2018-05-15: 50 mg via ORAL

## 2018-05-15 MED ORDER — HEPARIN SOD (PORK) LOCK FLUSH 100 UNIT/ML IV SOLN
500.0000 [IU] | Freq: Once | INTRAVENOUS | Status: AC | PRN
  Administered 2018-05-15: 500 [IU]
  Filled 2018-05-15: qty 5

## 2018-05-15 MED ORDER — SODIUM CHLORIDE 0.9 % IV SOLN
Freq: Once | INTRAVENOUS | Status: AC
  Administered 2018-05-15: 09:00:00 via INTRAVENOUS
  Filled 2018-05-15: qty 250

## 2018-05-15 MED ORDER — SODIUM CHLORIDE 0.9 % IV SOLN
16.0000 mg/kg | Freq: Once | INTRAVENOUS | Status: AC
  Administered 2018-05-15: 1200 mg via INTRAVENOUS
  Filled 2018-05-15: qty 60

## 2018-05-15 MED ORDER — PROCHLORPERAZINE MALEATE 10 MG PO TABS
10.0000 mg | ORAL_TABLET | Freq: Once | ORAL | Status: AC
  Administered 2018-05-15: 10 mg via ORAL

## 2018-05-15 MED ORDER — SODIUM CHLORIDE 0.9% FLUSH
10.0000 mL | INTRAVENOUS | Status: DC | PRN
  Administered 2018-05-15: 10 mL
  Filled 2018-05-15: qty 10

## 2018-05-15 MED ORDER — DIPHENHYDRAMINE HCL 25 MG PO CAPS
ORAL_CAPSULE | ORAL | Status: AC
  Filled 2018-05-15: qty 2

## 2018-05-15 NOTE — Telephone Encounter (Signed)
Oral Oncology Patient Advocate Encounter  [employer] renewal application for Pomalyst was faxed 05/15/18.  This encounter will updated until final determination  [place] Patient Jean Davidson Phone [phone] Fax [phone]

## 2018-05-15 NOTE — Patient Instructions (Signed)
[facility] Discharge Instructions for Patients Receiving Chemotherapy  Today you received the following chemotherapy agents: Darzalex  To help prevent nausea and vomiting after your treatment, we encourage you to take your nausea medication as directed.    If you develop nausea and vomiting that is not controlled by your nausea medication, call the clinic.   BELOW ARE SYMPTOMS THAT SHOULD BE REPORTED IMMEDIATELY:  *FEVER GREATER THAN 100.5 F  *CHILLS WITH OR WITHOUT FEVER  NAUSEA AND VOMITING THAT IS NOT CONTROLLED WITH YOUR NAUSEA MEDICATION  *UNUSUAL SHORTNESS OF BREATH  *UNUSUAL BRUISING OR BLEEDING  TENDERNESS IN MOUTH AND THROAT WITH OR WITHOUT PRESENCE OF ULCERS  *URINARY PROBLEMS  *BOWEL PROBLEMS  UNUSUAL RASH Items with * indicate a potential emergency and should be followed up as soon as possible.  Feel free to call the clinic should you have any questions or concerns. The clinic phone number is [phone].  Please show the CHEMO ALERT CARD at check-in to the Emergency Department and triage nurse.   

## 2018-05-20 ENCOUNTER — Encounter: Payer: Self-pay | Admitting: Medical

## 2018-05-20 ENCOUNTER — Inpatient Hospital Stay: Payer: Medicare HMO

## 2018-05-20 ENCOUNTER — Inpatient Hospital Stay (HOSPITAL_BASED_OUTPATIENT_CLINIC_OR_DEPARTMENT_OTHER): Payer: Medicare HMO | Admitting: Medical

## 2018-05-20 ENCOUNTER — Telehealth: Payer: Self-pay | Admitting: *Deleted

## 2018-05-20 ENCOUNTER — Encounter: Payer: Self-pay | Admitting: Adult Health

## 2018-05-20 VITALS — BP 126/78 | HR 81 | Temp 97.7°F | Resp 18 | Ht 63.0 in | Wt 162.8 lb

## 2018-05-20 DIAGNOSIS — E8809 Other disorders of plasma-protein metabolism, not elsewhere classified: Secondary | ICD-10-CM | POA: Diagnosis not present

## 2018-05-20 DIAGNOSIS — R5381 Other malaise: Secondary | ICD-10-CM | POA: Diagnosis not present

## 2018-05-20 DIAGNOSIS — C9002 Multiple myeloma in relapse: Secondary | ICD-10-CM

## 2018-05-20 DIAGNOSIS — D469 Myelodysplastic syndrome, unspecified: Secondary | ICD-10-CM

## 2018-05-20 DIAGNOSIS — D701 Agranulocytosis secondary to cancer chemotherapy: Secondary | ICD-10-CM

## 2018-05-20 DIAGNOSIS — D702 Other drug-induced agranulocytosis: Secondary | ICD-10-CM

## 2018-05-20 DIAGNOSIS — M8580 Other specified disorders of bone density and structure, unspecified site: Secondary | ICD-10-CM

## 2018-05-20 DIAGNOSIS — Z79899 Other long term (current) drug therapy: Secondary | ICD-10-CM

## 2018-05-20 DIAGNOSIS — Z87891 Personal history of nicotine dependence: Secondary | ICD-10-CM

## 2018-05-20 DIAGNOSIS — Z9221 Personal history of antineoplastic chemotherapy: Secondary | ICD-10-CM | POA: Diagnosis not present

## 2018-05-20 DIAGNOSIS — Z9484 Stem cells transplant status: Secondary | ICD-10-CM | POA: Diagnosis not present

## 2018-05-20 DIAGNOSIS — Z5112 Encounter for antineoplastic immunotherapy: Secondary | ICD-10-CM | POA: Diagnosis not present

## 2018-05-20 DIAGNOSIS — Z96611 Presence of right artificial shoulder joint: Secondary | ICD-10-CM

## 2018-05-20 DIAGNOSIS — I10 Essential (primary) hypertension: Secondary | ICD-10-CM

## 2018-05-20 DIAGNOSIS — G479 Sleep disorder, unspecified: Secondary | ICD-10-CM

## 2018-05-20 DIAGNOSIS — R5383 Other fatigue: Secondary | ICD-10-CM

## 2018-05-20 DIAGNOSIS — K219 Gastro-esophageal reflux disease without esophagitis: Secondary | ICD-10-CM

## 2018-05-20 DIAGNOSIS — M791 Myalgia, unspecified site: Secondary | ICD-10-CM | POA: Diagnosis not present

## 2018-05-20 LAB — CMP (CANCER CENTER ONLY)
ALT: 12 U/L (ref 0–44)
AST: 12 U/L — ABNORMAL LOW (ref 15–41)
Albumin: 3.2 g/dL — ABNORMAL LOW (ref 3.5–5.0)
Alkaline Phosphatase: 73 U/L (ref 38–126)
Anion gap: 8 (ref 5–15)
BUN: 14 mg/dL (ref 8–23)
CO2: 26 mmol/L (ref 22–32)
Calcium: 8.7 mg/dL — ABNORMAL LOW (ref 8.9–10.3)
Chloride: 105 mmol/L (ref 98–111)
Creatinine: 0.74 mg/dL (ref 0.44–1.00)
GFR, Est AFR Am: >60 mL/min (ref >60)
GFR, Estimated: >60 mL/min (ref >60)
Glucose, Bld: 88 mg/dL (ref 70–99)
Potassium: 3.9 mmol/L (ref 3.5–5.1)
Sodium: 139 mmol/L (ref 135–145)
Total Bilirubin: 0.3 mg/dL (ref 0.3–1.2)
Total Protein: 6.1 g/dL — ABNORMAL LOW (ref 6.5–8.1)

## 2018-05-20 LAB — CBC WITH DIFFERENTIAL (CANCER CENTER ONLY)
Abs Immature Granulocytes: 0.01 10*3/uL (ref 0.00–0.07)
Basophils Absolute: 0 10*3/uL (ref 0.0–0.1)
Basophils Relative: 1 %
Eosinophils Absolute: 0.6 10*3/uL — ABNORMAL HIGH (ref 0.0–0.5)
Eosinophils Relative: 12 %
HCT: 39.1 % (ref 36.0–46.0)
Hemoglobin: 12.6 g/dL (ref 12.0–15.0)
Immature Granulocytes: 0 %
Lymphocytes Relative: 54 %
Lymphs Abs: 3 10*3/uL (ref 0.7–4.0)
MCH: 35.2 pg — ABNORMAL HIGH (ref 26.0–34.0)
MCHC: 32.2 g/dL (ref 30.0–36.0)
MCV: 109.2 fL — ABNORMAL HIGH (ref 80.0–100.0)
Monocytes Absolute: 1.1 10*3/uL — ABNORMAL HIGH (ref 0.1–1.0)
Monocytes Relative: 20 %
Neutro Abs: 0.7 10*3/uL — ABNORMAL LOW (ref 1.7–7.7)
Neutrophils Relative %: 13 %
Platelet Count: 224 10*3/uL (ref 150–400)
RBC: 3.58 MIL/uL — ABNORMAL LOW (ref 3.87–5.11)
RDW: 15 % (ref 11.5–15.5)
WBC Count: 5.4 10*3/uL (ref 4.0–10.5)
nRBC: 0 % (ref 0.0–0.2)

## 2018-05-20 MED ORDER — LEVOFLOXACIN 500 MG PO TABS: 500.0000 mg | ORAL_TABLET | Freq: Every day | ORAL | 0 refills | Status: DC

## 2018-05-20 NOTE — Telephone Encounter (Signed)
Called patient in response to Mychart message. She will come in today for labs and see Jean Davidson

## 2018-05-20 NOTE — Progress Notes (Signed)
Symptoms Management Clinic Progress Note   Jean Davidson [id number] [birth year] 72 y.o.  Jean Davidson is managed by Dr. [name]  Actively treated with chemotherapy/immunotherapy: Yes  Current Therapy: Darzalex  Last Treated: 05/15/18 (Cycle 3, Day 15)  Assessment: Plan:    Drug-induced neutropenia (HCC)  Hypoalbuminemia  Hypocalcemia  Multiple myeloma in relapse (HCC)  Fatigue, unspecified type  Myalgia   1) Neutropenia: ANC 0.7.  Gave script for Levaquin 500 mg qdayx7 with pt to begin and call for fever, chills, sweats, or other signs of infection.  2) Hypoalbuminemia: Albumin 3.2.  Discussed increased foods higher in protein.  3) Hypocalcemia: Calcium 8.7.  Recommended adding Tums twice daily.  4) Mutliple myeloma.  Status post Cycle 3 Day 15 of Darzalex which was dosed on 05/15/18.  She returns to see Dr. Alvy Bimler on 05/29/18.  5) Fatigue and myalgia: Discussed that these are likely secondary to Darzalex.   Please see After Visit Summary for patient specific instructions.  Future Appointments  Date Time Provider [place]  05/29/2018 10:00 AM [facility] ONLY [facility] None  05/29/2018 10:15 AM [facility] [place] FLUSH [facility] None  05/29/2018 11:00 AM Alvy Bimler, Ni, MD [facility] None  05/29/2018 11:30 AM [facility] INFUSION [facility] None  06/12/2018  8:45 AM [facility] LAB 2 [facility] None  06/12/2018  9:00 AM [facility] [place] FLUSH [facility] None  06/12/2018 10:00 AM [facility] INFUSION [facility] None  09/05/2019  9:30 AM [facility] VASC 2 [facility] None  09/05/2019 10:30 AM Dew, Erskine Squibb, MD [facility] None    No orders of the defined types were placed in this encounter.      Subjective:   Patient ID:  Jean Davidson is a 72 y.o. (DOB 28-Jul-1945) female.  Chief Complaint:  Chief Complaint  Patient presents with  . Fatigue    HPI Jean Davidson is a 72 y.o. Female with multiple myeloma.  She is followed by Dr. Alvy Bimler and is s/p C3D15 of  Darzalex dosed on 05/15/18.  She presents today with increased general fatigue that causes her to sleep frequently during the day, also waking up often in the night.  She reports aching and tenderness on her upper inner thighs, bilat biceps, and generalized w/out recent injury or strain.  Reports increased DOE when ambulating and performing ADLs.  States that she has current tinnitus but that she has an appt scheduled with an ENT specialist within the next month to address this issues.  Pt also reports intermittent mild fever and chills, the highest of which has been 99.5 for which the patient took 650 tylenol.  Pt also reports poor appetite but states that when she does eat she tries to eat high calorie/high protein foods as previously advised.  Medications: I have reviewed the patient's current medications.  Allergies: No Known Allergies  Past Medical History:  Diagnosis Date  . Anemia   . Anxiety   . Blood transfusion without reported diagnosis   . Cancer ([place])    remission for over 1 year  . Carotid stenosis    Mild bilateral  . Carpal tunnel syndrome   . Cervical disc disease    c7  . Cough 07/27/2015  . Disc degeneration   . Dysuria 07/26/2015  . Failure of stem cell transplant ([place])   . Fatigue 01/26/2015  . Fever 07/27/2015  . GERD (gastroesophageal reflux disease)   . 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 Hypertension    Borderline.  . Hypokalemia 07/16/2015  . Internal  and external hemorrhoids without complication   . Pinched nerve   . Sinusitis, bacterial 07/27/2015  . Spinal stenosis in cervical region   . Varicose veins of lower extremities with inflammation     Past Surgical History:  Procedure Laterality Date  . bladder tact  1988/1989   x2  . COLONOSCOPY    . IR IMAGING GUIDED PORT INSERTION  03/01/2018  . PORT-A-CATH REMOVAL    . SHOULDER SURGERY Right 04/06/13   right    Family History  Problem Relation Age of Onset  . Heart disease Mother   . Heart failure  Father   . Heart disease Father   . Throat cancer Father   . Skin cancer Father   . Diabetes Father   . Kidney disease Father   . Hypertension Brother   . Heart disease Brother        congenital shunt, ?   . Colon cancer Paternal Aunt 65  . Skin cancer Brother   . Stomach cancer Neg Hx     Social History   Socioeconomic History  . Marital status: Divorced    Spouse name: Not on file  . Number of children: Not on file  . Years of education: Not on file  . Highest education level: Not on file  Occupational History  . Not on file  Social Needs  . Financial resource strain: Not on file  . Food insecurity:    Worry: Not on file    Inability: Not on file  . Transportation needs:    Medical: Not on file    Non-medical: Not on file  Tobacco Use  . Smoking status: Former Smoker    Packs/day: 0.50    Years: 4.00    Pack years: 2.00    Last attempt to quit: 06/26/1968    Years since quitting: 49.9  . Smokeless tobacco: Never Used  Substance and Sexual Activity  . Alcohol use: Yes    Alcohol/week: 1.0 standard drinks    Types: 1 Cans of beer per week    Comment: 1 per week per pt  . Drug use: No  . Sexual activity: Not on file  Lifestyle  . Physical activity:    Days per week: Not on file    Minutes per session: Not on file  . Stress: Not on file  Relationships  . Social connections:    Talks on phone: Not on file    Gets together: Not on file    Attends religious service: Not on file    Active member of club or organization: Not on file    Attends meetings of clubs or organizations: Not on file    Relationship status: Not on file  . Intimate partner violence:    Fear of current or ex partner: Not on file    Emotionally abused: Not on file    Physically abused: Not on file    Forced sexual activity: Not on file  Other Topics Concern  . Not on file  Social History Narrative   Chauncey Reading is daughter leela, vanbrocklin will,  full code ( reviewed 2015)    Past  Medical History, Surgical history, Social history, and Family history were reviewed and updated as appropriate.   Please see review of systems for further details on the patient's review from today.   Review of Systems:  Review of Systems  Constitutional: Positive for appetite change, fatigue and fever (99.5 then takes Tylenol). Negative for chills and diaphoresis.  HENT: Negative  for trouble swallowing and voice change.   Respiratory: Positive for shortness of breath. Negative for cough, chest tightness and wheezing.   Cardiovascular: Negative for chest pain and palpitations.  Gastrointestinal: Negative for abdominal pain, constipation, diarrhea, nausea and vomiting.  Musculoskeletal: Negative for back pain and myalgias.  Neurological: Negative for dizziness, light-headedness and headaches.  Psychiatric/Behavioral: Positive for sleep disturbance.    Objective:   Physical Exam:  BP 126/78 (BP Location: Left Arm, Patient Position: Sitting)   Pulse 81   Temp 97.7 F (36.5 C) (Oral)   Resp 18   Ht 5' 3" (1.6 m)   Wt 162 lb 12.8 oz (73.8 kg)   SpO2 100%   BMI 28.84 kg/m  ECOG: 0  Physical Exam  Constitutional: No distress.  HENT:  Head: Normocephalic and atraumatic.  Cardiovascular: Normal rate, regular rhythm and normal heart sounds. Exam reveals no gallop and no friction rub.  No murmur heard. Pulmonary/Chest: Effort normal and breath sounds normal. No respiratory distress. She has no wheezes. She has no rales.  Musculoskeletal: She exhibits edema (trace pitting edema of the ankles.).  Neurological: She is alert.  Skin: Skin is warm and dry. No rash noted. She is not diaphoretic. No erythema.    Lab Review:     Component Value Date/Time   NA 139 05/20/2018 0927   NA 142 01/04/2017 0902   K 3.9 05/20/2018 0927   K 3.9 01/04/2017 0902   CL 105 05/20/2018 0927   CO2 26 05/20/2018 0927   CO2 28 01/04/2017 0902   GLUCOSE 88 05/20/2018 0927   GLUCOSE 92 01/04/2017 0902     BUN 14 05/20/2018 0927   BUN 9.5 01/04/2017 0902   CREATININE 0.74 05/20/2018 0927   CREATININE 0.7 01/04/2017 0902   CALCIUM 8.7 (L) 05/20/2018 0927   CALCIUM 9.0 01/04/2017 0902   PROT 6.1 (L) 05/20/2018 0927   PROT 6.0 (L) 01/04/2017 0902   PROT 5.8 (L) 01/04/2017 0902   ALBUMIN 3.2 (L) 05/20/2018 0927   ALBUMIN 3.4 (L) 01/04/2017 0902   AST 12 (L) 05/20/2018 0927   AST 21 01/04/2017 0902   ALT 12 05/20/2018 0927   ALT 16 01/04/2017 0902   ALKPHOS 73 05/20/2018 0927   ALKPHOS 101 01/04/2017 0902   BILITOT 0.3 05/20/2018 0927   BILITOT 0.56 01/04/2017 0902   GFRNONAA >60 05/20/2018 0927   GFRAA >60 05/20/2018 0927       Component Value Date/Time   WBC 5.4 05/20/2018 0927   WBC 12.0 (H) 03/09/2018 0612   RBC 3.58 (L) 05/20/2018 0927   HGB 12.6 05/20/2018 0927   HGB 12.5 01/04/2017 0902   HCT 39.1 05/20/2018 0927   HCT 36.5 01/04/2017 0902   PLT 224 05/20/2018 0927   PLT 159 01/04/2017 0902   MCV 109.2 (H) 05/20/2018 0927   MCV 103.1 (H) 01/04/2017 0902   MCH 35.2 (H) 05/20/2018 0927   MCHC 32.2 05/20/2018 0927   RDW 15.0 05/20/2018 0927   RDW 12.2 01/04/2017 0902   LYMPHSABS 3.0 05/20/2018 0927   LYMPHSABS 2.2 01/04/2017 0902   MONOABS 1.1 (H) 05/20/2018 0927   MONOABS 0.3 01/04/2017 0902   EOSABS 0.6 (H) 05/20/2018 0927   EOSABS 0.0 01/04/2017 0902   BASOSABS 0.0 05/20/2018 0927   BASOSABS 0.0 01/04/2017 0902   -------------------------------  Imaging from last 24 hours (if applicable):  Radiology interpretation: Dg Chest 1 View  Result Date: 05/08/2018 CLINICAL DATA:  Fever and cough. Shortness of breath. Multiple myeloma. EXAM:  CHEST  1 VIEW COMPARISON:  03/09/2018 FINDINGS: Heart size and pulmonary vascularity are normal. No infiltrates or effusions. Tiny calcified granulomas at the right lung base laterally. Power port in place. Right total shoulder prosthesis. Moderate arthritic changes of the left shoulder. Lucency just above the diaphragm to the  left of midline probably represents a hiatal hernia. IMPRESSION: No acute abnormalities. Electronically Signed   By: Lorriane Shire M.D.   On: 05/08/2018 16:41

## 2018-05-20 NOTE — Patient Instructions (Signed)

## 2018-05-21 ENCOUNTER — Encounter: Payer: Self-pay | Admitting: Medical

## 2018-05-21 ENCOUNTER — Telehealth: Payer: Self-pay | Admitting: Medical

## 2018-05-21 NOTE — Telephone Encounter (Signed)
Per 11/25 no los 

## 2018-05-27 ENCOUNTER — Other Ambulatory Visit: Payer: Self-pay | Admitting: Hematology and Oncology

## 2018-05-27 DIAGNOSIS — C9002 Multiple myeloma in relapse: Secondary | ICD-10-CM

## 2018-05-29 ENCOUNTER — Inpatient Hospital Stay: Payer: Medicare HMO | Admitting: Hematology and Oncology

## 2018-05-29 ENCOUNTER — Telehealth: Payer: Self-pay

## 2018-05-29 ENCOUNTER — Encounter: Payer: Self-pay | Admitting: Hematology and Oncology

## 2018-05-29 ENCOUNTER — Inpatient Hospital Stay: Payer: Medicare HMO

## 2018-05-29 ENCOUNTER — Telehealth: Payer: Self-pay | Admitting: Hematology and Oncology

## 2018-05-29 ENCOUNTER — Inpatient Hospital Stay: Payer: Medicare HMO | Attending: Hematology and Oncology

## 2018-05-29 VITALS — BP 133/67 | HR 79 | Temp 98.6°F | Resp 18 | Ht 63.0 in | Wt 162.6 lb

## 2018-05-29 VITALS — BP 118/63 | HR 75 | Temp 98.7°F | Resp 18

## 2018-05-29 DIAGNOSIS — K5900 Constipation, unspecified: Secondary | ICD-10-CM | POA: Insufficient documentation

## 2018-05-29 DIAGNOSIS — Z9221 Personal history of antineoplastic chemotherapy: Secondary | ICD-10-CM | POA: Diagnosis not present

## 2018-05-29 DIAGNOSIS — D469 Myelodysplastic syndrome, unspecified: Secondary | ICD-10-CM

## 2018-05-29 DIAGNOSIS — Z79899 Other long term (current) drug therapy: Secondary | ICD-10-CM | POA: Diagnosis not present

## 2018-05-29 DIAGNOSIS — J069 Acute upper respiratory infection, unspecified: Secondary | ICD-10-CM | POA: Diagnosis not present

## 2018-05-29 DIAGNOSIS — T451X5S Adverse effect of antineoplastic and immunosuppressive drugs, sequela: Secondary | ICD-10-CM | POA: Insufficient documentation

## 2018-05-29 DIAGNOSIS — C9002 Multiple myeloma in relapse: Secondary | ICD-10-CM | POA: Diagnosis not present

## 2018-05-29 DIAGNOSIS — Z96611 Presence of right artificial shoulder joint: Secondary | ICD-10-CM

## 2018-05-29 DIAGNOSIS — Z7951 Long term (current) use of inhaled steroids: Secondary | ICD-10-CM | POA: Insufficient documentation

## 2018-05-29 DIAGNOSIS — G620 Drug-induced polyneuropathy: Secondary | ICD-10-CM | POA: Diagnosis not present

## 2018-05-29 DIAGNOSIS — G4700 Insomnia, unspecified: Secondary | ICD-10-CM | POA: Diagnosis not present

## 2018-05-29 DIAGNOSIS — Z9484 Stem cells transplant status: Secondary | ICD-10-CM | POA: Diagnosis not present

## 2018-05-29 DIAGNOSIS — Z5112 Encounter for antineoplastic immunotherapy: Secondary | ICD-10-CM | POA: Diagnosis not present

## 2018-05-29 DIAGNOSIS — R5383 Other fatigue: Secondary | ICD-10-CM | POA: Diagnosis not present

## 2018-05-29 DIAGNOSIS — R630 Anorexia: Secondary | ICD-10-CM | POA: Diagnosis not present

## 2018-05-29 DIAGNOSIS — H9313 Tinnitus, bilateral: Secondary | ICD-10-CM

## 2018-05-29 DIAGNOSIS — R5381 Other malaise: Secondary | ICD-10-CM

## 2018-05-29 LAB — CMP (CANCER CENTER ONLY)
ALT: 14 U/L (ref 0–44)
AST: 18 U/L (ref 15–41)
Albumin: 3.5 g/dL (ref 3.5–5.0)
Alkaline Phosphatase: 71 U/L (ref 38–126)
Anion gap: 9 (ref 5–15)
BUN: 13 mg/dL (ref 8–23)
CO2: 24 mmol/L (ref 22–32)
Calcium: 9 mg/dL (ref 8.9–10.3)
Chloride: 106 mmol/L (ref 98–111)
Creatinine: 0.77 mg/dL (ref 0.44–1.00)
GFR, Est AFR Am: >60 mL/min (ref >60)
GFR, Estimated: >60 mL/min (ref >60)
Glucose, Bld: 94 mg/dL (ref 70–99)
Potassium: 4.1 mmol/L (ref 3.5–5.1)
Sodium: 139 mmol/L (ref 135–145)
Total Bilirubin: 0.3 mg/dL (ref 0.3–1.2)
Total Protein: 6.2 g/dL — ABNORMAL LOW (ref 6.5–8.1)

## 2018-05-29 LAB — CBC WITH DIFFERENTIAL (CANCER CENTER ONLY)
Basophils Absolute: 0 10*3/uL (ref 0.0–0.1)
Basophils Relative: 1 %
Eosinophils Absolute: 0.1 10*3/uL (ref 0.0–0.5)
Eosinophils Relative: 1 %
HCT: 38.7 % (ref 36.0–46.0)
Hemoglobin: 12.3 g/dL (ref 12.0–15.0)
Lymphocytes Relative: 55 %
Lymphs Abs: 3.3 10*3/uL (ref 0.7–4.0)
MCH: 35.2 pg — ABNORMAL HIGH (ref 26.0–34.0)
MCHC: 31.8 g/dL (ref 30.0–36.0)
MCV: 110.9 fL — ABNORMAL HIGH (ref 80.0–100.0)
Monocytes Absolute: 0.9 10*3/uL (ref 0.1–1.0)
Monocytes Relative: 14 %
Neutro Abs: 1.7 10*3/uL (ref 1.7–7.7)
Neutrophils Relative %: 29 %
Platelet Count: 278 10*3/uL (ref 150–400)
RBC: 3.49 MIL/uL — ABNORMAL LOW (ref 3.87–5.11)
RDW: 15 % (ref 11.5–15.5)
WBC Count: 6 10*3/uL (ref 4.0–10.5)
nRBC: 0 % (ref 0.0–0.2)

## 2018-05-29 MED ORDER — PROCHLORPERAZINE MALEATE 10 MG PO TABS
ORAL_TABLET | ORAL | Status: AC
  Filled 2018-05-29: qty 1

## 2018-05-29 MED ORDER — SODIUM CHLORIDE 0.9% FLUSH
10.0000 mL | INTRAVENOUS | Status: DC | PRN
  Administered 2018-05-29: 10 mL
  Filled 2018-05-29: qty 10

## 2018-05-29 MED ORDER — DIPHENHYDRAMINE HCL 25 MG PO CAPS
ORAL_CAPSULE | ORAL | Status: AC
  Filled 2018-05-29: qty 2

## 2018-05-29 MED ORDER — DIPHENHYDRAMINE HCL 25 MG PO CAPS
50.0000 mg | ORAL_CAPSULE | Freq: Once | ORAL | Status: AC
  Administered 2018-05-29: 50 mg via ORAL

## 2018-05-29 MED ORDER — PROCHLORPERAZINE MALEATE 10 MG PO TABS
10.0000 mg | ORAL_TABLET | Freq: Once | ORAL | Status: AC
  Administered 2018-05-29: 10 mg via ORAL

## 2018-05-29 MED ORDER — DEXAMETHASONE SODIUM PHOSPHATE 10 MG/ML IJ SOLN
INTRAMUSCULAR | Status: AC
  Filled 2018-05-29: qty 1

## 2018-05-29 MED ORDER — SODIUM CHLORIDE 0.9 % IV SOLN
Freq: Once | INTRAVENOUS | Status: AC
  Administered 2018-05-29: 11:00:00 via INTRAVENOUS
  Filled 2018-05-29: qty 250

## 2018-05-29 MED ORDER — SODIUM CHLORIDE 0.9 % IV SOLN: 10.0000 mg | Freq: Once | INTRAVENOUS | Status: DC

## 2018-05-29 MED ORDER — DEXAMETHASONE SODIUM PHOSPHATE 10 MG/ML IJ SOLN
10.0000 mg | Freq: Once | INTRAMUSCULAR | Status: AC
  Administered 2018-05-29: 10 mg via INTRAVENOUS

## 2018-05-29 MED ORDER — HEPARIN SOD (PORK) LOCK FLUSH 100 UNIT/ML IV SOLN
500.0000 [IU] | Freq: Once | INTRAVENOUS | Status: AC | PRN
  Administered 2018-05-29: 500 [IU]
  Filled 2018-05-29: qty 5

## 2018-05-29 MED ORDER — ACETAMINOPHEN 325 MG PO TABS
ORAL_TABLET | ORAL | Status: AC
  Filled 2018-05-29: qty 2

## 2018-05-29 MED ORDER — SODIUM CHLORIDE 0.9% FLUSH
10.0000 mL | Freq: Once | INTRAVENOUS | Status: AC
  Administered 2018-05-29: 10 mL
  Filled 2018-05-29: qty 10

## 2018-05-29 MED ORDER — SODIUM CHLORIDE 0.9 % IV SOLN
16.0000 mg/kg | Freq: Once | INTRAVENOUS | Status: AC
  Administered 2018-05-29: 1200 mg via INTRAVENOUS
  Filled 2018-05-29: qty 60

## 2018-05-29 MED ORDER — ACETAMINOPHEN 325 MG PO TABS
650.0000 mg | ORAL_TABLET | Freq: Once | ORAL | Status: AC
  Administered 2018-05-29: 650 mg via ORAL

## 2018-05-29 MED ORDER — SODIUM CHLORIDE 0.9 % IV SOLN
Freq: Once | INTRAVENOUS | Status: DC
  Filled 2018-05-29: qty 250

## 2018-05-29 NOTE — Telephone Encounter (Signed)
Gave patient avs and calendar.  Appointments made due to availability.

## 2018-05-29 NOTE — Telephone Encounter (Signed)
Called and faxed referral to [phone] at Dr. Pollie Friar office.

## 2018-05-29 NOTE — Assessment & Plan Note (Signed)
She tolerated treatment very well without major side effects except for insomnia which I think could be induced by steroids Due to lack of infusion reaction, I plan to reduce the dose of infusional steroids She is scheduled for repeat bone marrow aspirate and biopsy at [facility] on July 09, 2018 Repeat myeloma panel is pending We will call the patient with test results once they are available

## 2018-05-29 NOTE — Assessment & Plan Note (Signed)
She has been complaining of tinnitus for many months I recommend ENT consult

## 2018-05-29 NOTE — Assessment & Plan Note (Signed)
She has significant deconditioning We discussed physical therapy referral but the patient would like to exercise on her own Hopefully, with reduced dose steroids, we will help minimize the risk of steroid myopathy.

## 2018-05-29 NOTE — Progress Notes (Signed)
[place] OFFICE PROGRESS NOTE  Patient Care Team: [name], [name], MD as PCP - General (Internal Medicine) [name], MD as Referring Physician (Hematology and Oncology) [name], MD as Consulting Physician (Internal Medicine) [name], [name], MD as Consulting Physician (Infectious Diseases) [name], MD as Consulting Physician (Hematology and Oncology) [name], NP as Nurse Practitioner (Hematology and Oncology)  ASSESSMENT & PLAN:  Multiple myeloma [facility]) She tolerated treatment very well without major side effects except for insomnia which I think could be induced by steroids Due to lack of infusion reaction, I plan to reduce the dose of infusional steroids She is scheduled for repeat bone marrow aspirate and biopsy at [facility] on July 09, 2018 Repeat myeloma panel is pending We will call the patient with test results once they are available  Tinnitus of both ears She has been complaining of tinnitus for many months I recommend ENT consult  Physical deconditioning She has significant deconditioning We discussed physical therapy referral but the patient would like to exercise on her own Hopefully, with reduced dose steroids, we will help minimize the risk of steroid myopathy.   Orders Placed This Encounter  Procedures  . Ambulatory referral to ENT    Referral Priority:   Routine    Referral Type:   Consultation    Referral Reason:   Specialty Services Required    Referred to Provider:   Rozetta Nunnery, MD    Requested Specialty:   Otolaryngology    Number of Visits Requested:   1    INTERVAL HISTORY: Please see below for problem oriented charting. She returns for myeloma follow-up Since last time I saw her, she developed some upper respiratory infection requiring antibiotics Her symptoms has resolved She complained of insomnia She also has mild worsening neuropathy when the  weather changes She continues to have bilateral tinnitus and would like to see ENT No recent bone pain She is scheduled for repeat bone marrow biopsy at [facility] next month No infusional reactions. She has intermittent constipation which she attributed to to side effects of treatment, resolved spontaneously with conservative management  SUMMARY OF ONCOLOGIC HISTORY: Oncology History   Multiple myeloma, Ig A Lambda, M spike 3.54 grams, Calcium 9.2, Creatinine 0.8, Beta 2 microglobulin 4.52, IgA 4840 mg/dL, lambda light chain 75.4, albumin 3.6, hemoglobin 9.7, platelet 115    Primary site: Multiple Myeloma   Staging method: AJCC 6th Edition   Clinical: Stage IIA signed by [name], MD on 11/07/2013  2:46 PM   Summary: Stage IIA        Multiple myeloma ([place])   10/31/2013 Bone Marrow Biopsy    Bone marrow biopsy confirmed multiple myeloma with 40% bone marrow involvement. Skeletal survey showed minimal lesions in her score with generalized demineralization    11/10/2013 - 02/13/2014 Chemotherapy    The patient is started on induction chemotherapy with weekly dexamethasone 40 mg by mouth as well as Velcade subcutaneous injection on days 1, 4, 8 and 11. On 11/21/2013, she was started on monthly Zometa.    12/23/2013 Adverse Reaction    The dose of Velcade was reduced due to thrombocytopenia.    01/28/2014 - 04/07/2014 Chemotherapy    Revlimid is added. Treatment was discontinued due to lack of response.    02/24/2014 - 04/07/2014 Chemotherapy    Due to worsening peripheral neuropathy, Velcade injection is changed to once a week. Revlimid was given 21 days on, 7 days off.  04/07/2014 - 04/10/2014 Chemotherapy    Revlimid was discontinued due to lack of response. Chemotherapy was changed back to Velcade injection twice a week, 2 weeks on 1 week off. Her treatment was switched to to minimum response    04/20/2014 - 06/02/2014 Chemotherapy    chemotherapy is switched to Carfilzomib, Cytoxan and  dexamethasone.    04/22/2014 Procedure    she has placement of port for chemotherapy.    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 at [facility]    10/20/2014 - 03/24/2015 Chemotherapy     she received chemotherapy with Kyprolis, Revlimid and dexamethasone    10/22/2014 Procedure    She has port placement    01/19/2015 Tumor Marker    IgA lambda M spike at 0.4 g     01/20/2015 Miscellaneous    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 No cource was found and fever resolved with IV vancomycin and meropenem    09/13/2015 Bone Marrow Biopsy    Bone marrow biopsy showed no increased blasts, 3-4 % plasma cells    03/02/2016 Bone Marrow Biopsy    Bone marrow biopsy at [facility] showed normocellular (30%) bone marrow with trilineage hematopoiesis. No significant increase in blasts. No significant increase in plasma cells.    05/12/2016 Imaging    DEXA scan at [facility] showed osteopenia    10/24/2016 Imaging    Skeletal survey at [facility], no new lesions    12/07/2017 Imaging    No focal abnormality noted to suggest myeloma. Exam is stable from prior exam.     02/20/2018 -  Chemotherapy    The patient had daratumumab (DARZALEX) 1,200 mg in sodium chloride 0.9 % 940 mL (1.2 mg/mL) chemo infusion, 16 mg/kg = 1,200 mg, Intravenous, Once, 1 of 1 cycle Administration: 1,200 mg (03/06/2018) daratumumab (DARZALEX) 1,200 mg in sodium chloride 0.9 % 440 mL (2.4 mg/mL) chemo infusion, 16 mg/kg = 1,200 mg, Intravenous, Once, 1 of 1 cycle Administration: 1,200 mg (03/13/2018), 1,200 mg (03/20/2018) daratumumab (DARZALEX) 1,200 mg in sodium chloride 0.9 % 440 mL chemo infusion, 16 mg/kg = 1,200 mg, Intravenous, Once, 4 of 7 cycles Administration: 1,200 mg (03/28/2018), 1,200 mg (04/03/2018), 1,200 mg (04/10/2018), 1,200 mg (04/17/2018), 1,200 mg (04/24/2018), 1,200 mg (05/01/2018), 1,200 mg (05/15/2018)  for chemotherapy treatment.     03/01/2018 Procedure    Successful 8 French right internal jugular vein power port placement with its tip at the SVC/RA junction.     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[place])   04/06/2015 Bone Marrow Biopsy    Accession: [id number]  BM biopsy showed RAEB-1    04/06/2015 Tumor Marker    Cytogenetics and FISH for MDS are within normal limits    10/06/2015 - 10/10/2015 Chemotherapy    She received conditioning chemotherapy with busulfan and melphalan    10/12/2015 Bone Marrow Transplant    She received allogenic stem cell transplant    10/19/2015 Adverse Reaction    She developed posttransplant complication with mucositis, viral infection with rhinovirus, neutropenic fever, bilateral pleural effusion and moderate pericardial effusion and CMV reactivation.    10/31/2015 Miscellaneous    She has engrafted     REVIEW OF SYSTEMS:   Constitutional: Denies fevers, chills or abnormal weight loss Eyes: Denies blurriness of vision Ears, nose, mouth, throat, and face: Denies mucositis or sore throat Respiratory: Denies cough, dyspnea or wheezes Cardiovascular: Denies palpitation, chest discomfort or lower extremity  swelling Gastrointestinal:  Denies nausea, heartburn or change in bowel habits Skin: Denies abnormal skin rashes Lymphatics: Denies new lymphadenopathy or easy bruising Behavioral/Psych: Mood is stable, no new changes  All other systems were reviewed with the patient and are negative.  I have reviewed the past medical history, past surgical history, social history and family history with the patient and they are unchanged from previous note.  ALLERGIES:  has No Known Allergies.  MEDICATIONS:  Current Outpatient Medications  Medication Sig Dispense Refill  . acyclovir (ZOVIRAX) 400 MG tablet Take 1 tablet (400 mg total) by mouth 2 (two) times daily. 180 tablet 11  . calcipotriene (DOVONOX) 0.005 % cream     . calcium carbonate (TUMS - DOSED IN MG ELEMENTAL CALCIUM) 500 MG chewable tablet Chew 1 tablet by mouth daily.    . carvedilol (COREG) 3.125 MG tablet TAKE 1 TABLET (3.125 MG TOTAL) BY MOUTH 2 (TWO) TIMES DAILY WITH A MEAL. 180 tablet 3  . chlorpheniramine-HYDROcodone (TUSSIONEX PENNKINETIC ER) 10-8 MG/5ML SUER Take 5 mLs by mouth every 12 (twelve) hours as needed for cough. 140 mL 0  . Cholecalciferol (VITAMIN D-1000 MAX ST) 1000 units tablet Take 1,000 Units by mouth daily.    Marland Kitchen docusate sodium (COLACE) 100 MG capsule Take 100 mg by mouth daily as needed for mild constipation.     . lidocaine-prilocaine (EMLA) cream Apply to affected area once 30 g 3  . loperamide (IMODIUM) 2 MG capsule Take by mouth as needed for diarrhea or loose stools. As needed only    . mirtazapine (REMERON) 30 MG tablet Take 1 tablet by mouth at bedtime.    . Multiple Vitamin (MULTIVITAMIN WITH MINERALS) TABS tablet Take 1 tablet by mouth daily.    . Multiple Vitamins-Minerals (ICAPS AREDS 2) CAPS Take 1 capsule by mouth 2 (two) times daily.    . ondansetron (ZOFRAN) 8 MG tablet Take 1 tablet (8 mg total) by mouth every 8 (eight) hours as needed (Nausea or vomiting). 30 tablet 1  . pantoprazole (PROTONIX) 40 MG  tablet TAKE 1 TABLET (40 MG TOTAL) BY MOUTH DAILY BEFORE BREAKFAST. 90 tablet 3  . pomalidomide (POMALYST) 2 MG capsule Take 1 capsule (2 mg total) by mouth daily. Take with water on days 1-21. Repeat every 28 days. 21 capsule 0  . prochlorperazine (COMPAZINE) 10 MG tablet Take 1 tablet (10 mg total) by mouth every 6 (six) hours as needed (Nausea or vomiting). 30 tablet 1   No current facility-administered medications for this visit.    Facility-Administered Medications Ordered in Other Visits  Medication Dose Route Frequency Provider Last Rate Last Dose  . 0.9 %  sodium chloride infusion   Intravenous Once Alvy Bimler, Ni, MD      . acetaminophen (TYLENOL) tablet 650 mg  650 mg Oral Once Alvy Bimler, Ni, MD      . daratumumab (DARZALEX) 1,200 mg in sodium chloride 0.9 % 440 mL chemo infusion  16 mg/kg (Treatment Plan Recorded) Intravenous Once Alvy Bimler, Ni, MD      . dexamethasone (DECADRON) 10 mg in sodium chloride 0.9 % 50 mL IVPB  10 mg Intravenous Once Alvy Bimler, Ni, MD      . diphenhydrAMINE (BENADRYL) capsule 50 mg  50 mg Oral Once Alvy Bimler, Ni, MD      . heparin lock flush 100 unit/mL  500 Units Intracatheter Once PRN Alvy Bimler, Ni, MD      . prochlorperazine (COMPAZINE) tablet 10 mg  10 mg Oral Once Gorsuch, Ni, MD      . sodium chloride flush (NS) 0.9 % injection 10 mL  10 mL Intracatheter PRN Alvy Bimler, Ni, MD        PHYSICAL EXAMINATION: ECOG PERFORMANCE STATUS: 1 - Symptomatic but completely ambulatory  Vitals:   05/29/18 1032  BP: 133/67  Pulse: 79  Resp: 18  Temp: 98.6 F (37 C)  SpO2: 100%   Filed Weights   05/29/18 1032  Weight: 162 lb 9.6 oz (73.8 kg)    GENERAL:alert, no distress and comfortable SKIN: skin color, texture, turgor are normal, no rashes or significant lesions EYES: normal, Conjunctiva are pink and non-injected, sclera clear OROPHARYNX:no exudate, no erythema and lips, buccal mucosa, and tongue normal  NECK: supple, thyroid normal size, non-tender, without  nodularity LYMPH:  no palpable lymphadenopathy in the cervical, axillary or inguinal LUNGS: clear to auscultation and percussion with normal breathing effort HEART: regular rate & rhythm and no murmurs and no lower extremity edema ABDOMEN:abdomen soft, non-tender and normal bowel sounds Musculoskeletal:no cyanosis of digits and no clubbing  NEURO: alert & oriented x 3 with fluent speech, no focal motor/sensory deficits  LABORATORY DATA:  I have reviewed the data as listed    Component Value Date/Time   NA 139 05/29/2018 0954   NA 142 01/04/2017 0902   K 4.1 05/29/2018 0954   K 3.9 01/04/2017 0902   CL 106 05/29/2018 0954   CO2 24 05/29/2018 0954   CO2 28 01/04/2017 0902   GLUCOSE 94 05/29/2018 0954   GLUCOSE 92 01/04/2017 0902   BUN 13 05/29/2018 0954   BUN 9.5 01/04/2017 0902   CREATININE 0.77 05/29/2018 0954   CREATININE 0.7 01/04/2017 0902   CALCIUM 9.0 05/29/2018 0954   CALCIUM 9.0 01/04/2017 0902   PROT 6.2 (L) 05/29/2018 0954   PROT 6.0 (L) 01/04/2017 0902   PROT 5.8 (L) 01/04/2017 0902   ALBUMIN 3.5 05/29/2018 0954   ALBUMIN 3.4 (L) 01/04/2017 0902   AST 18 05/29/2018 0954   AST 21 01/04/2017 0902   ALT 14 05/29/2018 0954   ALT 16 01/04/2017 0902   ALKPHOS 71 05/29/2018 0954   ALKPHOS 101 01/04/2017 0902   BILITOT 0.3 05/29/2018 0954   BILITOT 0.56 01/04/2017 0902   GFRNONAA >60 05/29/2018 0954   GFRAA >60 05/29/2018 0954    No results found for: SPEP, UPEP  Lab Results  Component Value Date   WBC 6.0 05/29/2018   NEUTROABS PENDING 05/29/2018   HGB 12.3 05/29/2018   HCT 38.7 05/29/2018   MCV 110.9 (H) 05/29/2018   PLT 278 05/29/2018      Chemistry      Component Value Date/Time  NA 139 05/29/2018 0954   NA 142 01/04/2017 0902   K 4.1 05/29/2018 0954   K 3.9 01/04/2017 0902   CL 106 05/29/2018 0954   CO2 24 05/29/2018 0954   CO2 28 01/04/2017 0902   BUN 13 05/29/2018 0954   BUN 9.5 01/04/2017 0902   CREATININE 0.77 05/29/2018 0954    CREATININE 0.7 01/04/2017 0902      Component Value Date/Time   CALCIUM 9.0 05/29/2018 0954   CALCIUM 9.0 01/04/2017 0902   ALKPHOS 71 05/29/2018 0954   ALKPHOS 101 01/04/2017 0902   AST 18 05/29/2018 0954   AST 21 01/04/2017 0902   ALT 14 05/29/2018 0954   ALT 16 01/04/2017 0902   BILITOT 0.3 05/29/2018 0954   BILITOT 0.56 01/04/2017 0902       RADIOGRAPHIC STUDIES: I have personally reviewed the radiological images as listed and agreed with the findings in the report. Dg Chest 1 View  Result Date: 05/08/2018 CLINICAL DATA:  Fever and cough. Shortness of breath. Multiple myeloma. EXAM: CHEST  1 VIEW COMPARISON:  03/09/2018 FINDINGS: Heart size and pulmonary vascularity are normal. No infiltrates or effusions. Tiny calcified granulomas at the right lung base laterally. Power port in place. Right total shoulder prosthesis. Moderate arthritic changes of the left shoulder. Lucency just above the diaphragm to the left of midline probably represents a hiatal hernia. IMPRESSION: No acute abnormalities. Electronically Signed   By: Lorriane Shire M.D.   On: 05/08/2018 16:41    All questions were answered. The patient knows to call the clinic with any problems, questions or concerns. No barriers to learning was detected.  I spent 15 minutes counseling the patient face to face. The total time spent in the appointment was 20 minutes and more than 50% was on counseling and review of test results  [name], MD 05/29/2018 11:11 AM

## 2018-05-29 NOTE — Patient Instructions (Signed)
[facility] Discharge Instructions for Patients Receiving Chemotherapy  Today you received the following chemotherapy agents: Darzalex  To help prevent nausea and vomiting after your treatment, we encourage you to take your nausea medication as directed.    If you develop nausea and vomiting that is not controlled by your nausea medication, call the clinic.   BELOW ARE SYMPTOMS THAT SHOULD BE REPORTED IMMEDIATELY:  *FEVER GREATER THAN 100.5 F  *CHILLS WITH OR WITHOUT FEVER  NAUSEA AND VOMITING THAT IS NOT CONTROLLED WITH YOUR NAUSEA MEDICATION  *UNUSUAL SHORTNESS OF BREATH  *UNUSUAL BRUISING OR BLEEDING  TENDERNESS IN MOUTH AND THROAT WITH OR WITHOUT PRESENCE OF ULCERS  *URINARY PROBLEMS  *BOWEL PROBLEMS  UNUSUAL RASH Items with * indicate a potential emergency and should be followed up as soon as possible.  Feel free to call the clinic should you have any questions or concerns. The clinic phone number is [phone].  Please show the CHEMO ALERT CARD at check-in to the Emergency Department and triage nurse.   

## 2018-05-30 LAB — KAPPA/LAMBDA LIGHT CHAINS
Kappa free light chain: 13.5 mg/L (ref 3.3–19.4)
Kappa, lambda light chain ratio: 0.51 (ref 0.26–1.65)
Lambda free light chains: 26.6 mg/L — ABNORMAL HIGH (ref 5.7–26.3)

## 2018-06-01 LAB — MULTIPLE MYELOMA PANEL, SERUM
Albumin SerPl Elph-Mcnc: 3.3 g/dL (ref 2.9–4.4)
Albumin/Glob SerPl: 1.3 (ref 0.7–1.7)
Alpha 1: 0.3 g/dL (ref 0.0–0.4)
Alpha2 Glob SerPl Elph-Mcnc: 1 g/dL (ref 0.4–1.0)
B-Globulin SerPl Elph-Mcnc: 0.8 g/dL (ref 0.7–1.3)
Gamma Glob SerPl Elph-Mcnc: 0.5 g/dL (ref 0.4–1.8)
Globulin, Total: 2.6 g/dL (ref 2.2–3.9)
IgA: 148 mg/dL (ref 64–422)
IgG (Immunoglobin G), Serum: 467 mg/dL — ABNORMAL LOW (ref 700–1600)
IgM (Immunoglobulin M), Srm: 104 mg/dL (ref 26–217)
M Protein SerPl Elph-Mcnc: 0.3 g/dL — ABNORMAL HIGH
Total Protein ELP: 5.9 g/dL — ABNORMAL LOW (ref 6.0–8.5)

## 2018-06-03 ENCOUNTER — Telehealth: Payer: Self-pay

## 2018-06-03 NOTE — Telephone Encounter (Signed)
Called and given below message. She verbalized understanding. Mailed out a copy of labs to her, per Dr. Alvy Bimler.

## 2018-06-03 NOTE — Telephone Encounter (Signed)
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

## 2018-06-05 ENCOUNTER — Encounter: Payer: Self-pay | Admitting: Hematology and Oncology

## 2018-06-06 ENCOUNTER — Encounter: Payer: Self-pay | Admitting: Hematology and Oncology

## 2018-06-10 ENCOUNTER — Telehealth: Payer: Self-pay

## 2018-06-10 ENCOUNTER — Encounter: Payer: Self-pay | Admitting: Hematology and Oncology

## 2018-06-10 NOTE — Telephone Encounter (Signed)
Called regarding mychart message for appt. Per Dr. Alvy Bimler given appt for tomorrow at 0930, instructed to check in at 0900. She verbalized understanding.

## 2018-06-11 ENCOUNTER — Encounter: Payer: Self-pay | Admitting: Hematology and Oncology

## 2018-06-11 ENCOUNTER — Telehealth: Payer: Self-pay | Admitting: Hematology and Oncology

## 2018-06-11 ENCOUNTER — Inpatient Hospital Stay: Payer: Medicare HMO | Admitting: Hematology and Oncology

## 2018-06-11 DIAGNOSIS — Z79899 Other long term (current) drug therapy: Secondary | ICD-10-CM

## 2018-06-11 DIAGNOSIS — Z9484 Stem cells transplant status: Secondary | ICD-10-CM

## 2018-06-11 DIAGNOSIS — Z7951 Long term (current) use of inhaled steroids: Secondary | ICD-10-CM

## 2018-06-11 DIAGNOSIS — Z86711 Personal history of pulmonary embolism: Secondary | ICD-10-CM

## 2018-06-11 DIAGNOSIS — T451X5S Adverse effect of antineoplastic and immunosuppressive drugs, sequela: Secondary | ICD-10-CM

## 2018-06-11 DIAGNOSIS — G4700 Insomnia, unspecified: Secondary | ICD-10-CM

## 2018-06-11 DIAGNOSIS — C9002 Multiple myeloma in relapse: Secondary | ICD-10-CM

## 2018-06-11 DIAGNOSIS — H903 Sensorineural hearing loss, bilateral: Secondary | ICD-10-CM | POA: Diagnosis not present

## 2018-06-11 DIAGNOSIS — Z9221 Personal history of antineoplastic chemotherapy: Secondary | ICD-10-CM | POA: Diagnosis not present

## 2018-06-11 DIAGNOSIS — H9313 Tinnitus, bilateral: Secondary | ICD-10-CM | POA: Diagnosis not present

## 2018-06-11 DIAGNOSIS — K5900 Constipation, unspecified: Secondary | ICD-10-CM | POA: Diagnosis not present

## 2018-06-11 DIAGNOSIS — R5381 Other malaise: Secondary | ICD-10-CM

## 2018-06-11 DIAGNOSIS — G620 Drug-induced polyneuropathy: Secondary | ICD-10-CM | POA: Diagnosis not present

## 2018-06-11 DIAGNOSIS — Z5112 Encounter for antineoplastic immunotherapy: Secondary | ICD-10-CM | POA: Diagnosis not present

## 2018-06-11 DIAGNOSIS — T451X5A Adverse effect of antineoplastic and immunosuppressive drugs, initial encounter: Secondary | ICD-10-CM | POA: Insufficient documentation

## 2018-06-11 NOTE — Assessment & Plan Note (Signed)
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 

## 2018-06-11 NOTE — Assessment & Plan Note (Signed)
She has mild persistent tinnitus and hearing deficit She has appointment pending to see ENT

## 2018-06-11 NOTE — Assessment & Plan Note (Signed)
She has some mild physical deconditioning We discussed importance of exercise as tolerated

## 2018-06-11 NOTE — Telephone Encounter (Signed)
Gave avs and calendar ° °

## 2018-06-11 NOTE — Assessment & Plan Note (Signed)
She still has some mild exacerbation of peripheral neuropathy recently, could be related to cold weather We discussed the risk and benefits of getting treatment with gabapentin but the patient would like to hold off for now

## 2018-06-11 NOTE — Progress Notes (Signed)
[facility] OFFICE PROGRESS NOTE  Patient Care Team: [name], [name], MD as PCP - General (Internal Medicine) [name], MD as Referring Physician (Hematology and Oncology) [name], MD as Consulting Physician (Internal Medicine) [name], [name], MD as Consulting Physician (Infectious Diseases) [name], MD as Consulting Physician (Hematology and Oncology) [name], NP as Nurse Practitioner (Hematology and Oncology)  ASSESSMENT & PLAN:  Multiple myeloma [facility]) I have reviewed multiple test results with the patient The patient has persistent detectable M spike, biclonal in nature with both IgA and IgG disease I tried my best to explain to the patient how to interpret the test results She has bone marrow biopsy pending at [facility] on January 14 She will continue her current treatment with Pomalyst and daratumumab, along with Zometa every 3 months I reminded her the importance of getting dental visit while on treatment  Peripheral neuropathy due to chemotherapy [facility]) She still has some mild exacerbation of peripheral neuropathy recently, could be related to cold weather We discussed the risk and benefits of getting treatment with gabapentin but the patient would like to hold off for now  Physical deconditioning She has some mild physical deconditioning We discussed importance of exercise as tolerated  Tinnitus of both ears She has mild persistent tinnitus and hearing deficit She has appointment pending to see ENT   No orders of the defined types were placed in this encounter.   INTERVAL HISTORY: Please see below for problem oriented charting. She returns for further follow-up She has numerous questions related to interpretation of test results and plan She tolerated the recent treatment well She perceives some mild exacerbation of neuropathy in her feet but not significantly debilitating She has not  been able to exercise recently due to lack of motivation and has gained some weight She is to have mild persistent tinnitus and hearing deficit.  ENT appointment is pending  SUMMARY OF ONCOLOGIC HISTORY: Oncology History   Multiple myeloma, Ig A Lambda, M spike 3.54 grams, Calcium 9.2, Creatinine 0.8, Beta 2 microglobulin 4.52, IgA 4840 mg/dL, lambda light chain 75.4, albumin 3.6, hemoglobin 9.7, platelet 115    Primary site: Multiple Myeloma   Staging method: AJCC 6th Edition   Clinical: Stage IIA signed by [name], MD on 11/07/2013  2:46 PM   Summary: Stage IIA        Multiple myeloma ([place])   10/31/2013 Bone Marrow Biopsy    Bone marrow biopsy confirmed multiple myeloma with 40% bone marrow involvement. Skeletal survey showed minimal lesions in her score with generalized demineralization    11/10/2013 - 02/13/2014 Chemotherapy    The patient is started on induction chemotherapy with weekly dexamethasone 40 mg by mouth as well as Velcade subcutaneous injection on days 1, 4, 8 and 11. On 11/21/2013, she was started on monthly Zometa.    12/23/2013 Adverse Reaction    The dose of Velcade was reduced due to thrombocytopenia.    01/28/2014 - 04/07/2014 Chemotherapy    Revlimid is added. Treatment was discontinued due to lack of response.    02/24/2014 - 04/07/2014 Chemotherapy    Due to worsening peripheral neuropathy, Velcade injection is changed to once a week. Revlimid was given 21 days on, 7 days off.    04/07/2014 - 04/10/2014 Chemotherapy    Revlimid was discontinued due to lack of response. Chemotherapy was changed back to Velcade injection twice a week, 2 weeks on 1 week off. Her treatment was  switched to to minimum response    04/20/2014 - 06/02/2014 Chemotherapy    chemotherapy is switched to Carfilzomib, Cytoxan and dexamethasone.    04/22/2014 Procedure    she has placement of port for chemotherapy.    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 at [facility]    10/20/2014 - 03/24/2015 Chemotherapy     she received chemotherapy with Kyprolis, Revlimid and dexamethasone    10/22/2014 Procedure    She has port placement    01/19/2015 Tumor Marker    IgA lambda M spike at 0.4 g     01/20/2015 Miscellaneous    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 No cource was found and fever resolved with IV vancomycin and meropenem    09/13/2015 Bone Marrow Biopsy    Bone marrow biopsy showed no increased blasts, 3-4 % plasma cells    03/02/2016 Bone Marrow Biopsy    Bone marrow biopsy at [facility] showed normocellular (30%) bone marrow with trilineage hematopoiesis. No significant increase in blasts. No significant increase in plasma cells.    05/12/2016 Imaging    DEXA scan at [place] showed osteopenia    10/24/2016 Imaging    Skeletal survey at [facility], no new lesions    12/07/2017 Imaging    No focal abnormality noted to suggest myeloma. Exam is stable from prior exam.    03/01/2018 Procedure    Successful 8 French right internal jugular vein power port placement with its tip at the SVC/RA junction.    03/06/2018 -  Chemotherapy     The patient had daratumumab (DARZALEX) 1,200 mg in sodium chloride 0.9 % 940 mL (1.2 mg/mL) chemo infusion, 16 mg/kg = 1,200 mg, Intravenous, Once, 1 of 1 cycle Administration: 1,200 mg (03/06/2018) daratumumab (DARZALEX) 1,200 mg in sodium chloride 0.9 % 440 mL (2.4 mg/mL) chemo infusion, 16 mg/kg = 1,200 mg, Intravenous, Once, 1 of 1 cycle Administration: 1,200 mg (03/13/2018), 1,200 mg (03/20/2018) daratumumab (DARZALEX) 1,200 mg in sodium chloride 0.9 % 440 mL chemo infusion, 16 mg/kg = 1,200 mg, Intravenous, Once, 4 of 7 cycles Administration: 1,200 mg (03/28/2018), 1,200 mg (04/03/2018), 1,200 mg (04/10/2018), 1,200 mg (04/17/2018), 1,200 mg (04/24/2018), 1,200 mg (05/01/2018), 1,200 mg (05/15/2018), 1,200 mg (05/29/2018)  for chemotherapy treatment.      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    10/31/2015 Miscellaneous    She has engrafted     REVIEW OF SYSTEMS:   Constitutional: Denies fevers, chills or abnormal weight loss Eyes: Denies blurriness of vision Ears, nose, mouth, throat, and face: Denies mucositis or sore throat Respiratory: Denies cough, dyspnea or wheezes Cardiovascular: Denies palpitation, chest discomfort or lower extremity swelling Gastrointestinal:  Denies nausea, heartburn or change in bowel habits Skin: Denies abnormal skin rashes Lymphatics: Denies new  lymphadenopathy or easy bruising Behavioral/Psych: Mood is stable, no new changes  All other systems were reviewed with the patient and are negative.  I have reviewed the past medical history, past surgical history, social history and family history with the patient and they are unchanged from previous note.  ALLERGIES:  has No Known Allergies.  MEDICATIONS:  Current Outpatient Medications  Medication Sig Dispense Refill  . acyclovir (ZOVIRAX) 400 MG tablet Take 1 tablet (400 mg total) by mouth 2 (two) times daily. 180 tablet 11  . calcipotriene (DOVONOX) 0.005 % cream     . calcium carbonate (TUMS - DOSED IN MG ELEMENTAL CALCIUM) 500 MG chewable tablet Chew 1 tablet by mouth daily.    . carvedilol (COREG) 3.125 MG tablet TAKE 1 TABLET (3.125 MG TOTAL) BY MOUTH 2 (TWO) TIMES DAILY WITH A MEAL. 180 tablet 3  . chlorpheniramine-HYDROcodone (TUSSIONEX PENNKINETIC ER) 10-8 MG/5ML SUER Take 5 mLs by mouth every 12 (twelve) hours as needed for cough. 140 mL 0  . Cholecalciferol (VITAMIN D-1000 MAX ST) 1000 units tablet Take 1,000 Units by mouth daily.    Marland Kitchen docusate sodium (COLACE) 100 MG capsule Take 100 mg by mouth daily as needed for mild constipation.     . lidocaine-prilocaine (EMLA) cream Apply to affected area once 30 g 3  . loperamide (IMODIUM) 2 MG capsule Take by mouth as needed for diarrhea or loose stools. As needed only    . mirtazapine (REMERON) 30 MG tablet Take 1 tablet by mouth at bedtime.    . Multiple Vitamin (MULTIVITAMIN WITH MINERALS) TABS tablet Take 1 tablet by mouth daily.    . Multiple Vitamins-Minerals (ICAPS AREDS 2) CAPS Take 1 capsule by mouth 2 (two) times daily.    . ondansetron (ZOFRAN) 8 MG tablet Take 1 tablet (8 mg total) by mouth every 8 (eight) hours as needed (Nausea or vomiting). 30 tablet 1  . pantoprazole (PROTONIX) 40 MG tablet TAKE 1 TABLET (40 MG TOTAL) BY MOUTH DAILY BEFORE BREAKFAST. 90 tablet 3  . pomalidomide (POMALYST) 2 MG capsule Take 1 capsule (2  mg total) by mouth daily. Take with water on days 1-21. Repeat every 28 days. 21 capsule 0  . prochlorperazine (COMPAZINE) 10 MG tablet Take 1 tablet (10 mg total) by mouth every 6 (six) hours as needed (Nausea or vomiting). 30 tablet 1   No current facility-administered medications for this visit.     PHYSICAL EXAMINATION: ECOG PERFORMANCE STATUS: 1 - Symptomatic but completely ambulatory  Vitals:   06/11/18 0917  BP: 128/85  Pulse: 78  Resp: 18  Temp: 98.4 F (36.9 C)  SpO2: 100%   Filed Weights   06/11/18 0917  Weight: 167 lb 3.2 oz (75.8  kg)    GENERAL:alert, no distress and comfortable SKIN: skin color, texture, turgor are normal, no rashes or significant lesions EYES: normal, Conjunctiva are pink and non-injected, sclera clear OROPHARYNX:no exudate, no erythema and lips, buccal mucosa, and tongue normal  NECK: supple, thyroid normal size, non-tender, without nodularity LYMPH:  no palpable lymphadenopathy in the cervical, axillary or inguinal LUNGS: clear to auscultation and percussion with normal breathing effort HEART: regular rate & rhythm and no murmurs and no lower extremity edema ABDOMEN:abdomen soft, non-tender and normal bowel sounds Musculoskeletal:no cyanosis of digits and no clubbing  NEURO: alert & oriented x 3 with fluent speech, no focal motor/sensory deficits  LABORATORY DATA:  I have reviewed the data as listed    Component Value Date/Time   NA 139 05/29/2018 0954   NA 142 01/04/2017 0902   K 4.1 05/29/2018 0954   K 3.9 01/04/2017 0902   CL 106 05/29/2018 0954   CO2 24 05/29/2018 0954   CO2 28 01/04/2017 0902   GLUCOSE 94 05/29/2018 0954   GLUCOSE 92 01/04/2017 0902   BUN 13 05/29/2018 0954   BUN 9.5 01/04/2017 0902   CREATININE 0.77 05/29/2018 0954   CREATININE 0.7 01/04/2017 0902   CALCIUM 9.0 05/29/2018 0954   CALCIUM 9.0 01/04/2017 0902   PROT 6.2 (L) 05/29/2018 0954   PROT 6.0 (L) 01/04/2017 0902   PROT 5.8 (L) 01/04/2017 0902    ALBUMIN 3.5 05/29/2018 0954   ALBUMIN 3.4 (L) 01/04/2017 0902   AST 18 05/29/2018 0954   AST 21 01/04/2017 0902   ALT 14 05/29/2018 0954   ALT 16 01/04/2017 0902   ALKPHOS 71 05/29/2018 0954   ALKPHOS 101 01/04/2017 0902   BILITOT 0.3 05/29/2018 0954   BILITOT 0.56 01/04/2017 0902   GFRNONAA >60 05/29/2018 0954   GFRAA >60 05/29/2018 0954    No results found for: SPEP, UPEP  Lab Results  Component Value Date   WBC 6.0 05/29/2018   NEUTROABS 1.7 05/29/2018   HGB 12.3 05/29/2018   HCT 38.7 05/29/2018   MCV 110.9 (H) 05/29/2018   PLT 278 05/29/2018      Chemistry      Component Value Date/Time   NA 139 05/29/2018 0954   NA 142 01/04/2017 0902   K 4.1 05/29/2018 0954   K 3.9 01/04/2017 0902   CL 106 05/29/2018 0954   CO2 24 05/29/2018 0954   CO2 28 01/04/2017 0902   BUN 13 05/29/2018 0954   BUN 9.5 01/04/2017 0902   CREATININE 0.77 05/29/2018 0954   CREATININE 0.7 01/04/2017 0902      Component Value Date/Time   CALCIUM 9.0 05/29/2018 0954   CALCIUM 9.0 01/04/2017 0902   ALKPHOS 71 05/29/2018 0954   ALKPHOS 101 01/04/2017 0902   AST 18 05/29/2018 0954   AST 21 01/04/2017 0902   ALT 14 05/29/2018 0954   ALT 16 01/04/2017 0902   BILITOT 0.3 05/29/2018 0954   BILITOT 0.56 01/04/2017 0902      All questions were answered. The patient knows to call the clinic with any problems, questions or concerns. No barriers to learning was detected.  I spent 15 minutes counseling the patient face to face. The total time spent in the appointment was 20 minutes and more than 50% was on counseling and review of test results  [name], MD 06/11/2018 10:50 AM

## 2018-06-12 ENCOUNTER — Other Ambulatory Visit: Payer: Self-pay | Admitting: *Deleted

## 2018-06-12 ENCOUNTER — Inpatient Hospital Stay: Payer: Medicare HMO

## 2018-06-12 VITALS — BP 125/72 | HR 73 | Temp 98.6°F | Resp 20 | Wt 166.0 lb

## 2018-06-12 DIAGNOSIS — C9002 Multiple myeloma in relapse: Secondary | ICD-10-CM

## 2018-06-12 DIAGNOSIS — D469 Myelodysplastic syndrome, unspecified: Secondary | ICD-10-CM

## 2018-06-12 DIAGNOSIS — Z5112 Encounter for antineoplastic immunotherapy: Secondary | ICD-10-CM | POA: Diagnosis not present

## 2018-06-12 LAB — CMP (CANCER CENTER ONLY)
ALT: 15 U/L (ref 0–44)
AST: 14 U/L — ABNORMAL LOW (ref 15–41)
Albumin: 3.4 g/dL — ABNORMAL LOW (ref 3.5–5.0)
Alkaline Phosphatase: 82 U/L (ref 38–126)
Anion gap: 9 (ref 5–15)
BUN: 11 mg/dL (ref 8–23)
CO2: 25 mmol/L (ref 22–32)
Calcium: 9.2 mg/dL (ref 8.9–10.3)
Chloride: 104 mmol/L (ref 98–111)
Creatinine: 0.69 mg/dL (ref 0.44–1.00)
GFR, Est AFR Am: >60 mL/min (ref >60)
GFR, Estimated: >60 mL/min (ref >60)
Glucose, Bld: 91 mg/dL (ref 70–99)
Potassium: 4 mmol/L (ref 3.5–5.1)
Sodium: 138 mmol/L (ref 135–145)
Total Bilirubin: 0.3 mg/dL (ref 0.3–1.2)
Total Protein: 5.9 g/dL — ABNORMAL LOW (ref 6.5–8.1)

## 2018-06-12 LAB — CBC WITH DIFFERENTIAL (CANCER CENTER ONLY)
Abs Immature Granulocytes: 0.01 10*3/uL (ref 0.00–0.07)
Basophils Absolute: 0 10*3/uL (ref 0.0–0.1)
Basophils Relative: 1 %
Eosinophils Absolute: 0.2 10*3/uL (ref 0.0–0.5)
Eosinophils Relative: 3 %
HCT: 38.5 % (ref 36.0–46.0)
Hemoglobin: 12.6 g/dL (ref 12.0–15.0)
Immature Granulocytes: 0 %
Lymphocytes Relative: 35 %
Lymphs Abs: 2.3 10*3/uL (ref 0.7–4.0)
MCH: 35.8 pg — ABNORMAL HIGH (ref 26.0–34.0)
MCHC: 32.7 g/dL (ref 30.0–36.0)
MCV: 109.4 fL — ABNORMAL HIGH (ref 80.0–100.0)
Monocytes Absolute: 1.1 10*3/uL — ABNORMAL HIGH (ref 0.1–1.0)
Monocytes Relative: 17 %
Neutro Abs: 3 10*3/uL (ref 1.7–7.7)
Neutrophils Relative %: 44 %
Platelet Count: 208 10*3/uL (ref 150–400)
RBC: 3.52 MIL/uL — ABNORMAL LOW (ref 3.87–5.11)
RDW: 13.7 % (ref 11.5–15.5)
WBC Count: 6.6 10*3/uL (ref 4.0–10.5)
nRBC: 0 % (ref 0.0–0.2)

## 2018-06-12 MED ORDER — SODIUM CHLORIDE 0.9% FLUSH
10.0000 mL | Freq: Once | INTRAVENOUS | Status: AC
  Administered 2018-06-12: 10 mL
  Filled 2018-06-12: qty 10

## 2018-06-12 MED ORDER — DIPHENHYDRAMINE HCL 25 MG PO CAPS
ORAL_CAPSULE | ORAL | Status: AC
  Filled 2018-06-12: qty 1

## 2018-06-12 MED ORDER — DIPHENHYDRAMINE HCL 25 MG PO CAPS
50.0000 mg | ORAL_CAPSULE | Freq: Once | ORAL | Status: AC
  Administered 2018-06-12: 50 mg via ORAL

## 2018-06-12 MED ORDER — SODIUM CHLORIDE 0.9 % IV SOLN: 10.0000 mg | Freq: Once | INTRAVENOUS | Status: DC

## 2018-06-12 MED ORDER — SODIUM CHLORIDE 0.9 % IV SOLN
16.0000 mg/kg | Freq: Once | INTRAVENOUS | Status: AC
  Administered 2018-06-12: 1200 mg via INTRAVENOUS
  Filled 2018-06-12: qty 60

## 2018-06-12 MED ORDER — SODIUM CHLORIDE 0.9 % IV SOLN
Freq: Once | INTRAVENOUS | Status: AC
  Administered 2018-06-12: 10:00:00 via INTRAVENOUS
  Filled 2018-06-12: qty 250

## 2018-06-12 MED ORDER — DIPHENHYDRAMINE HCL 25 MG PO CAPS
ORAL_CAPSULE | ORAL | Status: AC
  Filled 2018-06-12: qty 2

## 2018-06-12 MED ORDER — PROCHLORPERAZINE MALEATE 10 MG PO TABS
ORAL_TABLET | ORAL | Status: AC
  Filled 2018-06-12: qty 1

## 2018-06-12 MED ORDER — PROCHLORPERAZINE MALEATE 10 MG PO TABS
10.0000 mg | ORAL_TABLET | Freq: Once | ORAL | Status: AC
  Administered 2018-06-12: 10 mg via ORAL

## 2018-06-12 MED ORDER — HEPARIN SOD (PORK) LOCK FLUSH 100 UNIT/ML IV SOLN
500.0000 [IU] | Freq: Once | INTRAVENOUS | Status: AC | PRN
  Administered 2018-06-12: 500 [IU]
  Filled 2018-06-12: qty 5

## 2018-06-12 MED ORDER — DEXAMETHASONE SODIUM PHOSPHATE 10 MG/ML IJ SOLN
10.0000 mg | Freq: Once | INTRAMUSCULAR | Status: AC
  Administered 2018-06-12: 10 mg via INTRAVENOUS

## 2018-06-12 MED ORDER — SODIUM CHLORIDE 0.9% FLUSH
10.0000 mL | INTRAVENOUS | Status: DC | PRN
  Administered 2018-06-12: 10 mL
  Filled 2018-06-12: qty 10

## 2018-06-12 MED ORDER — DEXAMETHASONE SODIUM PHOSPHATE 10 MG/ML IJ SOLN
INTRAMUSCULAR | Status: AC
  Filled 2018-06-12: qty 1

## 2018-06-12 MED ORDER — ACETAMINOPHEN 325 MG PO TABS
650.0000 mg | ORAL_TABLET | Freq: Once | ORAL | Status: AC
  Administered 2018-06-12: 650 mg via ORAL

## 2018-06-12 MED ORDER — ACETAMINOPHEN 325 MG PO TABS
ORAL_TABLET | ORAL | Status: AC
  Filled 2018-06-12: qty 2

## 2018-06-12 MED ORDER — POMALIDOMIDE 2 MG PO CAPS: 2.0000 mg | ORAL_CAPSULE | Freq: Every day | ORAL | 0 refills | Status: DC

## 2018-06-12 NOTE — Patient Instructions (Signed)
[facility] Discharge Instructions for Patients Receiving Chemotherapy  Today you received the following chemotherapy agents Daratumumab (DARZALEX).  To help prevent nausea and vomiting after your treatment, we encourage you to take your nausea medication as prescribed.   If you develop nausea and vomiting that is not controlled by your nausea medication, call the clinic.   BELOW ARE SYMPTOMS THAT SHOULD BE REPORTED IMMEDIATELY:  *FEVER GREATER THAN 100.5 F  *CHILLS WITH OR WITHOUT FEVER  NAUSEA AND VOMITING THAT IS NOT CONTROLLED WITH YOUR NAUSEA MEDICATION  *UNUSUAL SHORTNESS OF BREATH  *UNUSUAL BRUISING OR BLEEDING  TENDERNESS IN MOUTH AND THROAT WITH OR WITHOUT PRESENCE OF ULCERS  *URINARY PROBLEMS  *BOWEL PROBLEMS  UNUSUAL RASH Items with * indicate a potential emergency and should be followed up as soon as possible.  Feel free to call the clinic should you have any questions or concerns. The clinic phone number is [phone].  Please show the CHEMO ALERT CARD at check-in to the Emergency Department and triage nurse.   

## 2018-06-20 ENCOUNTER — Encounter: Payer: Self-pay | Admitting: Hematology and Oncology

## 2018-06-21 ENCOUNTER — Other Ambulatory Visit: Payer: Self-pay | Admitting: *Deleted

## 2018-06-21 DIAGNOSIS — C9002 Multiple myeloma in relapse: Secondary | ICD-10-CM

## 2018-06-21 MED ORDER — POMALIDOMIDE 2 MG PO CAPS: 2.0000 mg | ORAL_CAPSULE | Freq: Every day | ORAL | 0 refills | Status: DC

## 2018-06-24 ENCOUNTER — Telehealth: Payer: Self-pay | Admitting: *Deleted

## 2018-06-24 NOTE — Telephone Encounter (Signed)
Patient called she has a head cold that has traveled from her head to her chest. She is up coughing most of the night. She states it is productive. She has used "numerous" over the counter medications.   She is required to stay home until her Pomalyst is delivered today. Can Dr Alvy Bimler send in this medication for her or does she need to see Symptom Management first?

## 2018-06-24 NOTE — Telephone Encounter (Signed)
She needs to be seen I can see her first appt tomorrow am Tell her to hold Pomalyst restart until I see her

## 2018-06-24 NOTE — Telephone Encounter (Signed)
Returned call to patient she agrees with plan to come in to see Dr. Alvy Bimler tomorrow. Patient verbalized an understanding not to start Pomalyst until she is evaluated. Patient reports her start date is Wednesday anyhow. Appt scheduled.

## 2018-06-25 ENCOUNTER — Encounter: Payer: Self-pay | Admitting: Hematology and Oncology

## 2018-06-25 ENCOUNTER — Inpatient Hospital Stay: Payer: Medicare HMO

## 2018-06-25 ENCOUNTER — Ambulatory Visit (HOSPITAL_COMMUNITY)
Admission: RE | Admit: 2018-06-25 | Discharge: 2018-06-25 | Disposition: A | Discharge status: Home / Self Care | Payer: Medicare HMO | Source: Ambulatory Visit | Attending: Hematology and Oncology | Admitting: Hematology and Oncology

## 2018-06-25 ENCOUNTER — Inpatient Hospital Stay: Payer: Medicare HMO | Admitting: Hematology and Oncology

## 2018-06-25 VITALS — BP 141/79 | HR 97 | Temp 97.8°F | Resp 18 | Ht 63.0 in | Wt 161.8 lb

## 2018-06-25 DIAGNOSIS — Z9221 Personal history of antineoplastic chemotherapy: Secondary | ICD-10-CM | POA: Diagnosis not present

## 2018-06-25 DIAGNOSIS — R5383 Other fatigue: Secondary | ICD-10-CM | POA: Diagnosis not present

## 2018-06-25 DIAGNOSIS — Z9484 Stem cells transplant status: Secondary | ICD-10-CM | POA: Diagnosis not present

## 2018-06-25 DIAGNOSIS — C9002 Multiple myeloma in relapse: Secondary | ICD-10-CM

## 2018-06-25 DIAGNOSIS — J069 Acute upper respiratory infection, unspecified: Secondary | ICD-10-CM | POA: Insufficient documentation

## 2018-06-25 DIAGNOSIS — R05 Cough: Secondary | ICD-10-CM | POA: Diagnosis not present

## 2018-06-25 DIAGNOSIS — Z7951 Long term (current) use of inhaled steroids: Secondary | ICD-10-CM | POA: Diagnosis not present

## 2018-06-25 DIAGNOSIS — Z79899 Other long term (current) drug therapy: Secondary | ICD-10-CM

## 2018-06-25 DIAGNOSIS — R911 Solitary pulmonary nodule: Secondary | ICD-10-CM | POA: Insufficient documentation

## 2018-06-25 DIAGNOSIS — Z5112 Encounter for antineoplastic immunotherapy: Secondary | ICD-10-CM | POA: Diagnosis not present

## 2018-06-25 LAB — CMP (CANCER CENTER ONLY)
ALT: 8 U/L (ref 0–44)
AST: 10 U/L — ABNORMAL LOW (ref 15–41)
Albumin: 3.4 g/dL — ABNORMAL LOW (ref 3.5–5.0)
Alkaline Phosphatase: 83 U/L (ref 38–126)
Anion gap: 13 (ref 5–15)
BUN: 12 mg/dL (ref 8–23)
CO2: 21 mmol/L — ABNORMAL LOW (ref 22–32)
Calcium: 9.7 mg/dL (ref 8.9–10.3)
Chloride: 105 mmol/L (ref 98–111)
Creatinine: 0.74 mg/dL (ref 0.44–1.00)
GFR, Est AFR Am: >60 mL/min (ref >60)
GFR, Estimated: >60 mL/min (ref >60)
Glucose, Bld: 124 mg/dL — ABNORMAL HIGH (ref 70–99)
Potassium: 3.8 mmol/L (ref 3.5–5.1)
Sodium: 139 mmol/L (ref 135–145)
Total Bilirubin: 0.3 mg/dL (ref 0.3–1.2)
Total Protein: 7 g/dL (ref 6.5–8.1)

## 2018-06-25 LAB — CBC WITH DIFFERENTIAL (CANCER CENTER ONLY)
Abs Immature Granulocytes: 0.01 10*3/uL (ref 0.00–0.07)
Basophils Absolute: 0.1 10*3/uL (ref 0.0–0.1)
Basophils Relative: 1 %
Eosinophils Absolute: 0.1 10*3/uL (ref 0.0–0.5)
Eosinophils Relative: 2 %
HCT: 42.6 % (ref 36.0–46.0)
Hemoglobin: 14.1 g/dL (ref 12.0–15.0)
Immature Granulocytes: 0 %
Lymphocytes Relative: 46 %
Lymphs Abs: 2.8 10*3/uL (ref 0.7–4.0)
MCH: 35.3 pg — ABNORMAL HIGH (ref 26.0–34.0)
MCHC: 33.1 g/dL (ref 30.0–36.0)
MCV: 106.5 fL — ABNORMAL HIGH (ref 80.0–100.0)
Monocytes Absolute: 1.4 10*3/uL — ABNORMAL HIGH (ref 0.1–1.0)
Monocytes Relative: 24 %
Neutro Abs: 1.6 10*3/uL — ABNORMAL LOW (ref 1.7–7.7)
Neutrophils Relative %: 27 %
Platelet Count: 230 10*3/uL (ref 150–400)
RBC: 4 MIL/uL (ref 3.87–5.11)
RDW: 12.4 % (ref 11.5–15.5)
WBC Count: 6.1 10*3/uL (ref 4.0–10.5)
nRBC: 0 % (ref 0.0–0.2)

## 2018-06-25 NOTE — Assessment & Plan Note (Signed)
She has recurrence of symptoms of URI Her examination is benign She has no signs of leukocytosis Chest x-ray is benign I recommend dexamethasone 8 mg daily for 3 days and to resume treatment as scheduled at the end of the week She can continue to use Afrin spray for nasal congestion She does not need antibiotic treatment

## 2018-06-25 NOTE — Assessment & Plan Note (Signed)
She had lung nodules seen on chest x-ray The patient does not have other risk factors such as smoking I recommend repeat chest x-ray again in 8 weeks, due around end of February

## 2018-06-25 NOTE — Assessment & Plan Note (Addendum)
She has what sounds like upper respiratory tract infection, likely viral in nature Recommend delaying pomalidomide until end of the week Her blood work and chest x-ray showed nothing to suggest pneumonia. We will resume her chemotherapy as scheduled at the end of the week.

## 2018-06-25 NOTE — Progress Notes (Signed)
[place] OFFICE PROGRESS NOTE  Patient Care Team: [name], [name], MD as PCP - General (Internal Medicine) [name], MD as Referring Physician (Hematology and Oncology) [name], MD as Consulting Physician (Internal Medicine) [name], [name], MD as Consulting Physician (Infectious Diseases) [name], MD as Consulting Physician (Hematology and Oncology) [name], NP as Nurse Practitioner (Hematology and Oncology)  ASSESSMENT & PLAN:  Multiple myeloma [facility]) She has what sounds like upper respiratory tract infection, likely viral in nature Recommend delaying pomalidomide until end of the week Her blood work and chest x-ray showed nothing to suggest pneumonia. We will resume her chemotherapy as scheduled at the end of the week.  URI (upper respiratory infection) She has recurrence of symptoms of URI Her examination is benign She has no signs of leukocytosis Chest x-ray is benign I recommend dexamethasone 8 mg daily for 3 days and to resume treatment as scheduled at the end of the week She can continue to use Afrin spray for nasal congestion She does not need antibiotic treatment  Lung nodule seen on imaging study She had lung nodules seen on chest x-ray The patient does not have other risk factors such as smoking I recommend repeat chest x-ray again in 8 weeks, due around end of February   Orders Placed This Encounter  Procedures  . DG Chest 2 View    Standing Status:   Future    Number of Occurrences:   1    Standing Expiration Date:   07/30/2019    Order Specific Question:   Reason for exam:    Answer:   cough, recent chemo    Order Specific Question:   Preferred imaging location?    Answer:   [facility]    INTERVAL HISTORY: Please see below for problem oriented charting. She is seen urgently due to symptoms of infection. On June 19, 2018, she complained of nasal congestion, headache  and nasal drainage with postnasal drip and coughing. She started taking Afrin and her symptoms had improved.  However, starting yesterday, she started to have significant deep cough which is dry, nonproductive with occasional gagging.  She denies sore throat. She had occasional chills and low-grade fever of 99.6, resolved with Tylenol. She is due to resume pomalidomide on June 26, 2018. She denies diarrhea.  Her appetite is poor.  She has some mild fatigue from recent infection. Denies dysuria, frequency or urgency.  SUMMARY OF ONCOLOGIC HISTORY: Oncology History   Multiple myeloma, Ig A Lambda, M spike 3.54 grams, Calcium 9.2, Creatinine 0.8, Beta 2 microglobulin 4.52, IgA 4840 mg/dL, lambda light chain 75.4, albumin 3.6, hemoglobin 9.7, platelet 115    Primary site: Multiple Myeloma   Staging method: AJCC 6th Edition   Clinical: Stage IIA signed by [name], MD on 11/07/2013  2:46 PM   Summary: Stage IIA        Multiple myeloma ([place])   10/31/2013 Bone Marrow Biopsy    Bone marrow biopsy confirmed multiple myeloma with 40% bone marrow involvement. Skeletal survey showed minimal lesions in her score with generalized demineralization    11/10/2013 - 02/13/2014 Chemotherapy    The patient is started on induction chemotherapy with weekly dexamethasone 40 mg by mouth as well as Velcade subcutaneous injection on days 1, 4, 8 and 11. On 11/21/2013, she was started on monthly Zometa.    12/23/2013 Adverse Reaction    The dose of Velcade was reduced due to thrombocytopenia.  01/28/2014 - 04/07/2014 Chemotherapy    Revlimid is added. Treatment was discontinued due to lack of response.    02/24/2014 - 04/07/2014 Chemotherapy    Due to worsening peripheral neuropathy, Velcade injection is changed to once a week. Revlimid was given 21 days on, 7 days off.    04/07/2014 - 04/10/2014 Chemotherapy    Revlimid was discontinued due to lack of response. Chemotherapy was changed back to Velcade  injection twice a week, 2 weeks on 1 week off. Her treatment was switched to to minimum response    04/20/2014 - 06/02/2014 Chemotherapy    chemotherapy is switched to Carfilzomib, Cytoxan and dexamethasone.    04/22/2014 Procedure    she has placement of port for chemotherapy.    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 at [facility]    10/20/2014 - 03/24/2015 Chemotherapy     she received chemotherapy with Kyprolis, Revlimid and dexamethasone    10/22/2014 Procedure    She has port placement    01/19/2015 Tumor Marker    IgA lambda M spike at 0.4 g     01/20/2015 Miscellaneous    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 No cource was found and fever resolved with IV vancomycin and meropenem    09/13/2015 Bone Marrow Biopsy    Bone marrow biopsy showed no increased blasts, 3-4 % plasma cells    03/02/2016 Bone Marrow Biopsy    Bone marrow biopsy at [facility] showed normocellular (30%) bone marrow with trilineage hematopoiesis. No significant increase in blasts. No significant increase in plasma cells.    05/12/2016 Imaging    DEXA scan at [place]  showed osteopenia    10/24/2016 Imaging    Skeletal survey at [facility], no new lesions    12/07/2017 Imaging    No focal abnormality noted to suggest myeloma. Exam is stable from prior exam.    03/01/2018 Procedure    Successful 8 French right internal jugular vein power port placement with its tip at the SVC/RA junction.    03/06/2018 -  Chemotherapy    The patient had daratumumab (DARZALEX) 1,200 mg in sodium chloride 0.9 % 940 mL (1.2 mg/mL) chemo infusion, 16 mg/kg = 1,200 mg, Intravenous, Once, 1 of 1 cycle Administration: 1,200 mg (03/06/2018) daratumumab (DARZALEX) 1,200 mg in sodium chloride 0.9 % 440 mL (2.4 mg/mL) chemo infusion, 16 mg/kg = 1,200 mg, Intravenous, Once, 1 of 1 cycle Administration: 1,200 mg (03/13/2018), 1,200 mg (03/20/2018) daratumumab (DARZALEX) 1,200 mg in sodium chloride 0.9 % 440 mL chemo infusion, 16 mg/kg = 1,200  mg, Intravenous, Once, 4 of 7 cycles Administration: 1,200 mg (03/28/2018), 1,200 mg (04/03/2018), 1,200 mg (04/10/2018), 1,200 mg (04/17/2018), 1,200 mg (04/24/2018), 1,200 mg (05/01/2018), 1,200 mg (05/15/2018), 1,200 mg (05/29/2018), 1,200 mg (06/12/2018)  for chemotherapy treatment.      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[place])   04/06/2015 Bone Marrow Biopsy    Accession: [id number] BM biopsy showed RAEB-1    04/06/2015 Tumor Marker    Cytogenetics and FISH for MDS are within normal limits    10/06/2015 - 10/10/2015 Chemotherapy    She received conditioning chemotherapy with busulfan and melphalan    10/12/2015 Bone Marrow Transplant    She received allogenic stem cell transplant    10/19/2015 Adverse Reaction    She developed posttransplant complication with mucositis, viral infection with rhinovirus, neutropenic fever, bilateral pleural effusion and moderate pericardial effusion and CMV reactivation.    10/31/2015 Miscellaneous    She has engrafted     REVIEW OF SYSTEMS:   Eyes: Denies blurriness of vision Ears, nose, mouth,  throat, and face: Denies mucositis or sore throat Cardiovascular: Denies palpitation, chest discomfort or lower extremity swelling Gastrointestinal:  Denies nausea, heartburn or change in bowel habits Skin: Denies abnormal skin rashes Lymphatics: Denies new lymphadenopathy or easy bruising Neurological:Denies numbness, tingling or new weaknesses Behavioral/Psych: Mood is stable, no new changes  All other systems were reviewed with the patient and are negative.  I have reviewed the past medical history, past surgical history, social history and family history with the patient and they are unchanged from previous note.  ALLERGIES:  has No Known Allergies.  MEDICATIONS:  Current Outpatient Medications  Medication Sig Dispense Refill  . acyclovir (ZOVIRAX) 400 MG tablet Take 1 tablet (400 mg total) by mouth 2 (two) times daily. 180 tablet 11  . calcipotriene (DOVONOX) 0.005 % cream     . calcium carbonate (TUMS - DOSED IN MG ELEMENTAL CALCIUM) 500 MG chewable tablet Chew 1 tablet by mouth daily.    . carvedilol (COREG) 3.125 MG tablet TAKE 1 TABLET (3.125 MG TOTAL) BY MOUTH 2 (TWO) TIMES DAILY WITH A MEAL. 180 tablet 3  . chlorpheniramine-HYDROcodone (TUSSIONEX PENNKINETIC ER) 10-8 MG/5ML SUER Take 5 mLs by mouth every 12 (twelve) hours as needed for cough. 140 mL 0  . Cholecalciferol (VITAMIN D-1000 MAX ST) 1000 units tablet Take 1,000 Units by mouth daily.    Marland Kitchen docusate sodium (COLACE) 100 MG capsule Take 100 mg by mouth daily as needed for mild constipation.     . lidocaine-prilocaine (EMLA) cream Apply to affected area once 30 g 3  . loperamide (IMODIUM) 2 MG capsule Take by mouth as needed for diarrhea or loose stools. As needed only    . mirtazapine (REMERON) 30 MG tablet Take 1 tablet by mouth at bedtime.    . Multiple Vitamin (MULTIVITAMIN WITH MINERALS) TABS tablet Take 1 tablet by mouth daily.    . Multiple Vitamins-Minerals (ICAPS AREDS 2) CAPS Take 1 capsule by mouth 2 (two) times  daily.    . ondansetron (ZOFRAN) 8 MG tablet Take 1 tablet (8 mg total) by mouth every 8 (eight) hours as needed (Nausea or vomiting). 30 tablet 1  . pantoprazole (PROTONIX) 40 MG tablet TAKE 1 TABLET (40 MG TOTAL) BY MOUTH DAILY BEFORE BREAKFAST. 90 tablet 3  . pomalidomide (POMALYST) 2 MG capsule Take 1 capsule (2 mg total) by mouth daily. Take with water on days 1-21. Repeat every 28 days. 21 capsule 0  . prochlorperazine (COMPAZINE) 10 MG tablet  Take 1 tablet (10 mg total) by mouth every 6 (six) hours as needed (Nausea or vomiting). 30 tablet 1   No current facility-administered medications for this visit.     PHYSICAL EXAMINATION: ECOG PERFORMANCE STATUS: 1 - Symptomatic but completely ambulatory  Vitals:   06/25/18 0822  BP: (!) 141/79  Pulse: 97  Resp: 18  Temp: 97.8 F (36.6 C)  SpO2: 100%   Filed Weights   06/25/18 0822  Weight: 161 lb 12.8 oz (73.4 kg)    GENERAL:alert, no distress and comfortable. Noted nasal quality of her voice SKIN: skin color, texture, turgor are normal, no rashes or significant lesions EYES: normal, Conjunctiva are pink and non-injected, sclera clear OROPHARYNX:no exudate, no erythema and lips, buccal mucosa, and tongue normal  NECK: supple, thyroid normal size, non-tender, without nodularity LYMPH:  no palpable lymphadenopathy in the cervical, axillary or inguinal LUNGS: clear to auscultation and percussion with normal breathing effort HEART: regular rate & rhythm and no murmurs and no lower extremity edema ABDOMEN:abdomen soft, non-tender and normal bowel sounds Musculoskeletal:no cyanosis of digits and no clubbing  NEURO: alert & oriented x 3 with fluent speech, no focal motor/sensory deficits  LABORATORY DATA:  I have reviewed the data as listed    Component Value Date/Time   NA 139 06/25/2018 0845   NA 142 01/04/2017 0902   K 3.8 06/25/2018 0845   K 3.9 01/04/2017 0902   CL 105 06/25/2018 0845   CO2 21 (L) 06/25/2018 0845   CO2 28  01/04/2017 0902   GLUCOSE 124 (H) 06/25/2018 0845   GLUCOSE 92 01/04/2017 0902   BUN 12 06/25/2018 0845   BUN 9.5 01/04/2017 0902   CREATININE 0.74 06/25/2018 0845   CREATININE 0.7 01/04/2017 0902   CALCIUM 9.7 06/25/2018 0845   CALCIUM 9.0 01/04/2017 0902   PROT 7.0 06/25/2018 0845   PROT 6.0 (L) 01/04/2017 0902   PROT 5.8 (L) 01/04/2017 0902   ALBUMIN 3.4 (L) 06/25/2018 0845   ALBUMIN 3.4 (L) 01/04/2017 0902   AST 10 (L) 06/25/2018 0845   AST 21 01/04/2017 0902   ALT 8 06/25/2018 0845   ALT 16 01/04/2017 0902   ALKPHOS 83 06/25/2018 0845   ALKPHOS 101 01/04/2017 0902   BILITOT 0.3 06/25/2018 0845   BILITOT 0.56 01/04/2017 0902   GFRNONAA >60 06/25/2018 0845   GFRAA >60 06/25/2018 0845    No results found for: SPEP, UPEP  Lab Results  Component Value Date   WBC 6.1 06/25/2018   NEUTROABS 1.6 (L) 06/25/2018   HGB 14.1 06/25/2018   HCT 42.6 06/25/2018   MCV 106.5 (H) 06/25/2018   PLT 230 06/25/2018      Chemistry      Component Value Date/Time   NA 139 06/25/2018 0845   NA 142 01/04/2017 0902   K 3.8 06/25/2018 0845   K 3.9 01/04/2017 0902   CL 105 06/25/2018 0845   CO2 21 (L) 06/25/2018 0845   CO2 28 01/04/2017 0902   BUN 12 06/25/2018 0845   BUN 9.5 01/04/2017 0902   CREATININE 0.74 06/25/2018 0845   CREATININE 0.7 01/04/2017 0902      Component Value Date/Time   CALCIUM 9.7 06/25/2018 0845   CALCIUM 9.0 01/04/2017 0902   ALKPHOS 83 06/25/2018 0845   ALKPHOS 101 01/04/2017 0902   AST 10 (L) 06/25/2018 0845   AST 21 01/04/2017 0902   ALT 8 06/25/2018 0845   ALT 16 01/04/2017 0902   BILITOT 0.3 06/25/2018 0845  BILITOT 0.56 01/04/2017 0902       RADIOGRAPHIC STUDIES: I have personally reviewed the radiological images as listed and agreed with the findings in the report. Dg Chest 2 View  Result Date: 06/25/2018 CLINICAL DATA:  History of multiple myeloma, new onset of cough and congestion over the last few days EXAM: CHEST - 2 VIEW COMPARISON:   Chest x-ray of 05/08/2018 and chest x-ray of 03/09/2018 FINDINGS: No active infiltrate or effusion is seen. There are 2 small nodular opacities in the right upper lobe, 1 of which lies just adjacent to the port of the Port-A-Cath. These are not seen on the chest x-ray of 03/09/2017, with vague visualization of the 1 near the port on the chest x-ray of 05/08/2017. These may be inflammatory in nature, but attention to these areas is recommended on follow-up chest x-ray. The heart is within normal limits in size. No acute bony abnormality is seen. Again there is a small hiatal hernia question. Reverse right shoulder replacement components are noted. IMPRESSION: 1. No pneumonia or effusion. 2. Questionable 2 small nodular opacities in the right upper lung as noted above. Recommend attention to these areas on follow-up chest x-ray. 3. Also question small hiatal hernia as noted previously. Electronically Signed   By: Ivar Drape M.D.   On: 06/25/2018 09:20    All questions were answered. The patient knows to call the clinic with any problems, questions or concerns. No barriers to learning was detected.  I spent 20 minutes counseling the patient face to face. The total time spent in the appointment was 25 minutes and more than 50% was on counseling and review of test results  [name], MD 06/25/2018 9:57 AM

## 2018-06-27 ENCOUNTER — Encounter: Payer: Self-pay | Admitting: Hematology and Oncology

## 2018-06-27 LAB — MULTIPLE MYELOMA PANEL, SERUM
Albumin SerPl Elph-Mcnc: 3.1 g/dL (ref 2.9–4.4)
Albumin/Glob SerPl: 1.1 (ref 0.7–1.7)
Alpha 1: 0.4 g/dL (ref 0.0–0.4)
Alpha2 Glob SerPl Elph-Mcnc: 1.1 g/dL — ABNORMAL HIGH (ref 0.4–1.0)
B-Globulin SerPl Elph-Mcnc: 1 g/dL (ref 0.7–1.3)
Gamma Glob SerPl Elph-Mcnc: 0.4 g/dL (ref 0.4–1.8)
Globulin, Total: 3 g/dL (ref 2.2–3.9)
IgA: 190 mg/dL (ref 64–422)
IgG (Immunoglobin G), Serum: 428 mg/dL — ABNORMAL LOW (ref 700–1600)
IgM (Immunoglobulin M), Srm: 89 mg/dL (ref 26–217)
M Protein SerPl Elph-Mcnc: 0.1 g/dL — ABNORMAL HIGH
Total Protein ELP: 6.1 g/dL (ref 6.0–8.5)

## 2018-06-27 LAB — KAPPA/LAMBDA LIGHT CHAINS
Kappa free light chain: 15.9 mg/L (ref 3.3–19.4)
Kappa, lambda light chain ratio: 0.66 (ref 0.26–1.65)
Lambda free light chains: 24.2 mg/L (ref 5.7–26.3)

## 2018-06-28 ENCOUNTER — Inpatient Hospital Stay: Payer: Medicare HMO | Admitting: Hematology and Oncology

## 2018-06-28 ENCOUNTER — Inpatient Hospital Stay: Payer: Medicare HMO

## 2018-06-28 ENCOUNTER — Inpatient Hospital Stay: Payer: Medicare HMO | Attending: Hematology and Oncology

## 2018-06-28 VITALS — BP 129/68 | HR 63 | Temp 99.0°F | Resp 14

## 2018-06-28 DIAGNOSIS — Z9484 Stem cells transplant status: Secondary | ICD-10-CM | POA: Diagnosis not present

## 2018-06-28 DIAGNOSIS — C9002 Multiple myeloma in relapse: Secondary | ICD-10-CM | POA: Diagnosis not present

## 2018-06-28 DIAGNOSIS — Z5112 Encounter for antineoplastic immunotherapy: Secondary | ICD-10-CM | POA: Diagnosis present

## 2018-06-28 DIAGNOSIS — D469 Myelodysplastic syndrome, unspecified: Secondary | ICD-10-CM

## 2018-06-28 MED ORDER — HEPARIN SOD (PORK) LOCK FLUSH 100 UNIT/ML IV SOLN
500.0000 [IU] | Freq: Once | INTRAVENOUS | Status: AC | PRN
  Administered 2018-06-28: 500 [IU]
  Filled 2018-06-28: qty 5

## 2018-06-28 MED ORDER — PROCHLORPERAZINE MALEATE 10 MG PO TABS
ORAL_TABLET | ORAL | Status: AC
  Filled 2018-06-28: qty 1

## 2018-06-28 MED ORDER — DIPHENHYDRAMINE HCL 25 MG PO CAPS
50.0000 mg | ORAL_CAPSULE | Freq: Once | ORAL | Status: AC
  Administered 2018-06-28: 50 mg via ORAL

## 2018-06-28 MED ORDER — ACETAMINOPHEN 325 MG PO TABS
ORAL_TABLET | ORAL | Status: AC
  Filled 2018-06-28: qty 2

## 2018-06-28 MED ORDER — ZOLEDRONIC ACID 4 MG/100ML IV SOLN
4.0000 mg | Freq: Once | INTRAVENOUS | Status: AC
  Administered 2018-06-28: 4 mg via INTRAVENOUS
  Filled 2018-06-28: qty 100

## 2018-06-28 MED ORDER — ACETAMINOPHEN 325 MG PO TABS
650.0000 mg | ORAL_TABLET | Freq: Once | ORAL | Status: AC
  Administered 2018-06-28: 650 mg via ORAL

## 2018-06-28 MED ORDER — DIPHENHYDRAMINE HCL 25 MG PO CAPS
ORAL_CAPSULE | ORAL | Status: AC
  Filled 2018-06-28: qty 2

## 2018-06-28 MED ORDER — SODIUM CHLORIDE 0.9 % IV SOLN
16.0000 mg/kg | Freq: Once | INTRAVENOUS | Status: AC
  Administered 2018-06-28: 1200 mg via INTRAVENOUS
  Filled 2018-06-28: qty 60

## 2018-06-28 MED ORDER — SODIUM CHLORIDE 0.9 % IV SOLN
Freq: Once | INTRAVENOUS | Status: DC
  Filled 2018-06-28: qty 250

## 2018-06-28 MED ORDER — DEXAMETHASONE SODIUM PHOSPHATE 10 MG/ML IJ SOLN
10.0000 mg | Freq: Once | INTRAMUSCULAR | Status: AC
  Administered 2018-06-28: 10 mg via INTRAVENOUS

## 2018-06-28 MED ORDER — SODIUM CHLORIDE 0.9% FLUSH
10.0000 mL | INTRAVENOUS | Status: DC | PRN
  Administered 2018-06-28: 10 mL
  Filled 2018-06-28: qty 10

## 2018-06-28 MED ORDER — DEXAMETHASONE SODIUM PHOSPHATE 10 MG/ML IJ SOLN
INTRAMUSCULAR | Status: AC
  Filled 2018-06-28: qty 1

## 2018-06-28 MED ORDER — PROCHLORPERAZINE MALEATE 10 MG PO TABS
10.0000 mg | ORAL_TABLET | Freq: Once | ORAL | Status: AC
  Administered 2018-06-28: 10 mg via ORAL

## 2018-06-28 NOTE — Patient Instructions (Signed)
[facility] Discharge Instructions for Patients Receiving Chemotherapy  Today you received the following chemotherapy agents Daratumumab (DARZALEX).  To help prevent nausea and vomiting after your treatment, we encourage you to take your nausea medication as prescribed.   If you develop nausea and vomiting that is not controlled by your nausea medication, call the clinic.   BELOW ARE SYMPTOMS THAT SHOULD BE REPORTED IMMEDIATELY:  *FEVER GREATER THAN 100.5 F  *CHILLS WITH OR WITHOUT FEVER  NAUSEA AND VOMITING THAT IS NOT CONTROLLED WITH YOUR NAUSEA MEDICATION  *UNUSUAL SHORTNESS OF BREATH  *UNUSUAL BRUISING OR BLEEDING  TENDERNESS IN MOUTH AND THROAT WITH OR WITHOUT PRESENCE OF ULCERS  *URINARY PROBLEMS  *BOWEL PROBLEMS  UNUSUAL RASH Items with * indicate a potential emergency and should be followed up as soon as possible.  Feel free to call the clinic should you have any questions or concerns. The clinic phone number is [phone].  Please show the CHEMO ALERT CARD at check-in to the Emergency Department and triage nurse.   

## 2018-07-05 ENCOUNTER — Encounter: Payer: Self-pay | Admitting: Hematology and Oncology

## 2018-07-05 DIAGNOSIS — L940 Localized scleroderma [morphea]: Secondary | ICD-10-CM | POA: Diagnosis not present

## 2018-07-05 DIAGNOSIS — Z23 Encounter for immunization: Secondary | ICD-10-CM | POA: Diagnosis not present

## 2018-07-08 ENCOUNTER — Telehealth: Payer: Self-pay

## 2018-07-08 NOTE — Telephone Encounter (Signed)
error 

## 2018-07-08 NOTE — Telephone Encounter (Signed)
Oral Oncology Patient Advocate Encounter  [employer] has approved Pomalyst to be filled with them through 06/26/19. Pomalyst will be shipped from [facility] by [place] to the patients home free of charge.  I called the patient to give her the good news and she verbalized understanding and great appreciation.  [facility] [place] Phone [phone] Fax [phone]

## 2018-07-09 DIAGNOSIS — C9000 Multiple myeloma not having achieved remission: Secondary | ICD-10-CM | POA: Diagnosis not present

## 2018-07-09 DIAGNOSIS — Z9481 Bone marrow transplant status: Secondary | ICD-10-CM | POA: Diagnosis not present

## 2018-07-09 DIAGNOSIS — R718 Other abnormality of red blood cells: Secondary | ICD-10-CM | POA: Diagnosis not present

## 2018-07-09 DIAGNOSIS — D469 Myelodysplastic syndrome, unspecified: Secondary | ICD-10-CM | POA: Diagnosis not present

## 2018-07-11 ENCOUNTER — Other Ambulatory Visit: Payer: Medicare HMO

## 2018-07-11 ENCOUNTER — Inpatient Hospital Stay: Payer: Medicare HMO

## 2018-07-11 ENCOUNTER — Encounter: Payer: Self-pay | Admitting: General Practice

## 2018-07-11 VITALS — BP 114/55 | HR 69 | Temp 98.7°F | Resp 18 | Wt 162.0 lb

## 2018-07-11 DIAGNOSIS — C9002 Multiple myeloma in relapse: Secondary | ICD-10-CM

## 2018-07-11 DIAGNOSIS — Z5112 Encounter for antineoplastic immunotherapy: Secondary | ICD-10-CM | POA: Diagnosis not present

## 2018-07-11 MED ORDER — SODIUM CHLORIDE 0.9% FLUSH
10.0000 mL | INTRAVENOUS | Status: DC | PRN
  Administered 2018-07-11: 10 mL
  Filled 2018-07-11: qty 10

## 2018-07-11 MED ORDER — DIPHENHYDRAMINE HCL 25 MG PO CAPS
50.0000 mg | ORAL_CAPSULE | Freq: Once | ORAL | Status: AC
  Administered 2018-07-11: 50 mg via ORAL

## 2018-07-11 MED ORDER — HEPARIN SOD (PORK) LOCK FLUSH 100 UNIT/ML IV SOLN
500.0000 [IU] | Freq: Once | INTRAVENOUS | Status: AC | PRN
  Administered 2018-07-11: 500 [IU]
  Filled 2018-07-11: qty 5

## 2018-07-11 MED ORDER — DEXAMETHASONE SODIUM PHOSPHATE 10 MG/ML IJ SOLN
INTRAMUSCULAR | Status: AC
  Filled 2018-07-11: qty 1

## 2018-07-11 MED ORDER — DEXAMETHASONE SODIUM PHOSPHATE 10 MG/ML IJ SOLN
10.0000 mg | Freq: Once | INTRAMUSCULAR | Status: AC
  Administered 2018-07-11: 10 mg via INTRAVENOUS

## 2018-07-11 MED ORDER — DIPHENHYDRAMINE HCL 25 MG PO CAPS
ORAL_CAPSULE | ORAL | Status: AC
  Filled 2018-07-11: qty 2

## 2018-07-11 MED ORDER — SODIUM CHLORIDE 0.9 % IV SOLN
16.0000 mg/kg | Freq: Once | INTRAVENOUS | Status: AC
  Administered 2018-07-11: 1200 mg via INTRAVENOUS
  Filled 2018-07-11: qty 60

## 2018-07-11 MED ORDER — ACETAMINOPHEN 325 MG PO TABS
650.0000 mg | ORAL_TABLET | Freq: Once | ORAL | Status: AC
  Administered 2018-07-11: 650 mg via ORAL

## 2018-07-11 MED ORDER — SODIUM CHLORIDE 0.9 % IV SOLN
Freq: Once | INTRAVENOUS | Status: AC
  Administered 2018-07-11: 09:00:00 via INTRAVENOUS
  Filled 2018-07-11: qty 250

## 2018-07-11 MED ORDER — PROCHLORPERAZINE MALEATE 10 MG PO TABS
10.0000 mg | ORAL_TABLET | Freq: Once | ORAL | Status: AC
  Administered 2018-07-11: 10 mg via ORAL

## 2018-07-11 MED ORDER — ACETAMINOPHEN 325 MG PO TABS
ORAL_TABLET | ORAL | Status: AC
  Filled 2018-07-11: qty 2

## 2018-07-11 MED ORDER — PROCHLORPERAZINE MALEATE 10 MG PO TABS
ORAL_TABLET | ORAL | Status: AC
  Filled 2018-07-11: qty 1

## 2018-07-11 NOTE — Progress Notes (Signed)
[place] Spiritual Care Note  Acquainted with Jean Davidson through [facility]. Had a catch-up visit in infusion, providing pastoral presence, empathic listening, and opportunity to share and reflect on medical updates. 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 She continues to keep in touch with friends for social support and makes a lot of digital color-by-number art for relaxation.  Jean Davidson also enjoys taking care of her daughter's dog during periodic trips. She laments that this round of treatment is harder on her older, more tired body, but is finding hope in Dr Calton Dach positive reframing of treatment's objective (essentially, to beat back the cancer and maintain health, if no longer to achieve a full remission).  Per pt, no other needs at this time, but she is aware of ongoing chaplain and support group availability. Please also page if immediate needs arise or circumstances change. Thank you.   [place], [place], [facility] Pager [phone] Voicemail [phone]

## 2018-07-11 NOTE — Patient Instructions (Signed)
[facility] Discharge Instructions for Patients Receiving Chemotherapy  Today you received the following chemotherapy agents: Darzalex  To help prevent nausea and vomiting after your treatment, we encourage you to take your nausea medication as directed.    If you develop nausea and vomiting that is not controlled by your nausea medication, call the clinic.   BELOW ARE SYMPTOMS THAT SHOULD BE REPORTED IMMEDIATELY:  *FEVER GREATER THAN 100.5 F  *CHILLS WITH OR WITHOUT FEVER  NAUSEA AND VOMITING THAT IS NOT CONTROLLED WITH YOUR NAUSEA MEDICATION  *UNUSUAL SHORTNESS OF BREATH  *UNUSUAL BRUISING OR BLEEDING  TENDERNESS IN MOUTH AND THROAT WITH OR WITHOUT PRESENCE OF ULCERS  *URINARY PROBLEMS  *BOWEL PROBLEMS  UNUSUAL RASH Items with * indicate a potential emergency and should be followed up as soon as possible.  Feel free to call the clinic should you have any questions or concerns. The clinic phone number is [phone].  Please show the CHEMO ALERT CARD at check-in to the Emergency Department and triage nurse.   

## 2018-07-16 ENCOUNTER — Other Ambulatory Visit: Payer: Self-pay

## 2018-07-16 DIAGNOSIS — C9002 Multiple myeloma in relapse: Secondary | ICD-10-CM

## 2018-07-16 MED ORDER — POMALIDOMIDE 2 MG PO CAPS: 2.0000 mg | ORAL_CAPSULE | Freq: Every day | ORAL | 0 refills | Status: DC

## 2018-07-23 ENCOUNTER — Telehealth: Payer: Self-pay

## 2018-07-23 ENCOUNTER — Encounter: Payer: Self-pay | Admitting: Hematology and Oncology

## 2018-07-23 NOTE — Telephone Encounter (Signed)
-----   Message from [name], MD sent at 07/23/2018  8:14 AM EST ----- Regarding: cancel appt tomorrow Pls call her Cancel chemo tomorrow and hold pomalyst as well Start with clear liquids for now plus imodium, slowly advance as tolerated We will call her again next week for update. Tell her not to worry about her appt for now

## 2018-07-23 NOTE — Telephone Encounter (Signed)
Called and given below message. She verbalized understanding. 

## 2018-07-24 ENCOUNTER — Inpatient Hospital Stay: Payer: Medicare HMO

## 2018-07-24 ENCOUNTER — Inpatient Hospital Stay: Payer: Medicare HMO | Admitting: Hematology and Oncology

## 2018-07-25 ENCOUNTER — Encounter: Payer: Self-pay | Admitting: Hematology and Oncology

## 2018-07-26 ENCOUNTER — Ambulatory Visit: Payer: Medicare HMO | Admitting: Gastroenterology

## 2018-07-26 ENCOUNTER — Encounter: Payer: Self-pay | Admitting: Gastroenterology

## 2018-07-26 ENCOUNTER — Encounter: Payer: Self-pay | Admitting: Hematology and Oncology

## 2018-07-26 ENCOUNTER — Other Ambulatory Visit: Payer: Self-pay | Admitting: Hematology and Oncology

## 2018-07-26 VITALS — BP 120/80 | HR 72 | Ht 62.5 in | Wt 157.0 lb

## 2018-07-26 DIAGNOSIS — K219 Gastro-esophageal reflux disease without esophagitis: Secondary | ICD-10-CM | POA: Diagnosis not present

## 2018-07-26 DIAGNOSIS — R142 Eructation: Secondary | ICD-10-CM

## 2018-07-26 DIAGNOSIS — R6881 Early satiety: Secondary | ICD-10-CM | POA: Insufficient documentation

## 2018-07-26 DIAGNOSIS — K449 Diaphragmatic hernia without obstruction or gangrene: Secondary | ICD-10-CM

## 2018-07-26 MED ORDER — PANTOPRAZOLE SODIUM 40 MG PO TBEC: 40.0000 mg | DELAYED_RELEASE_TABLET | Freq: Two times a day (BID) | ORAL | 2 refills | Status: DC

## 2018-07-26 NOTE — Patient Instructions (Addendum)
If you are age 73 or older, your body mass index should be between 23-30. Your Body mass index is 28.26 kg/m. If this is out of the aforementioned range listed, please consider follow up with your Primary Care Provider.  If you are age 97 or younger, your body mass index should be between 19-25. Your Body mass index is 28.26 kg/m. If this is out of the aformentioned range listed, please consider follow up with your Primary Care Provider.   We have sent the following medications to your pharmacy for you to pick up at your convenience: Pantoprazole - take twice daily  Follow up with Dr. Carlean Purl on September 06, 2018 at 1:30 pm.  Thank you for choosing me and [facility].   Alonza Bogus, PA-C

## 2018-07-26 NOTE — Progress Notes (Addendum)
07/26/2018 Jean Davidson [id number] October 11, 1945   HISTORY OF PRESENT ILLNESS: This is a pleasant 73 year old female who has multiple myeloma, previously with myelodysplastic syndrome.  This is incurable and she is on ongoing chemotherapy regimens.  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  Anyway, she is a patient of Dr. Celesta Aver.  She presents to our office today with complaints of a significant amount of nausea, early satiety, and belching.  She says that she is only able to eat very small amounts before feeling full.  Her last EGD was in September 2018 at which time she was found to have grade B esophagitis, 6 cm hiatal hernia, and gastritis.  She is currently on pantoprazole 40 mg daily for her upper GI symptoms.  She also reports that a couple of weeks ago she had sudden onset of severe diarrhea with urgency and had some episodes of incontinence.  This followed eating a salad from a restaurant and lasted a couple of days, but has since resolved and stools have returned to normal.  Explained that I suspect that that was likely some type of infectious source.   Past Medical History:  Diagnosis Date  . Anemia   . Anxiety   . Blood transfusion without reported diagnosis   . Cancer ([place])    remission for over 1 year  . Carotid stenosis    Mild bilateral  . Carpal tunnel syndrome   . Cervical disc disease    c7  . Cough 07/27/2015  . Disc degeneration   . Dysuria 07/26/2015  . 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 Fatigue 01/26/2015  . Fever 07/27/2015  . GERD (gastroesophageal reflux disease)   . 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 Hypertension    Borderline.  . Hypokalemia 07/16/2015  . Internal and external hemorrhoids without complication   . Pinched nerve   . Sinusitis, bacterial 07/27/2015  . Spinal stenosis in cervical region   . Varicose veins of lower extremities with inflammation    Past Surgical History:  Procedure Laterality Date  . bladder tact  1988/1989     x2  . COLONOSCOPY    . IR IMAGING GUIDED PORT INSERTION  03/01/2018  . PORT-A-CATH REMOVAL    . SHOULDER SURGERY Right 04/06/13   right    reports that she quit smoking about 50 years ago. She has a 2.00 pack-year smoking history. She has never used smokeless tobacco. She reports current alcohol use of about 1.0 standard drinks of alcohol per week. She reports that she does not use drugs. family history includes Colon cancer (age of onset: 66) in her paternal aunt; Diabetes in her father; Heart disease in her brother, father, and mother; Heart failure in her father; Hypertension in her brother; Kidney disease in her father; Skin cancer in her brother and father; Throat cancer in her father. No Known Allergies    Outpatient Encounter Medications as of 07/26/2018  Medication Sig  . acyclovir (ZOVIRAX) 400 MG tablet Take 1 tablet (400 mg total) by mouth 2 (two) times daily.  . calcipotriene (DOVONOX) 0.005 % cream   . calcium carbonate (TUMS - DOSED IN MG ELEMENTAL CALCIUM) 500 MG chewable tablet Chew 1 tablet by mouth daily.  . carvedilol (COREG) 3.125 MG tablet TAKE 1 TABLET (3.125 MG TOTAL) BY MOUTH 2 (TWO) TIMES DAILY WITH A MEAL.  Marland Kitchen Cholecalciferol (VITAMIN D-1000 MAX ST) 1000 units tablet Take 1,000 Units by mouth daily.  Marland Kitchen docusate sodium (  COLACE) 100 MG capsule Take 100 mg by mouth daily as needed for mild constipation.   . lidocaine-prilocaine (EMLA) cream Apply to affected area once  . loperamide (IMODIUM) 2 MG capsule Take by mouth as needed for diarrhea or loose stools. As needed only  . mirtazapine (REMERON) 30 MG tablet Take 1 tablet by mouth at bedtime.  . Multiple Vitamin (MULTIVITAMIN WITH MINERALS) TABS tablet Take 1 tablet by mouth daily.  . Multiple Vitamins-Minerals (ICAPS AREDS 2) CAPS Take 1 capsule by mouth 2 (two) times daily.  . ondansetron (ZOFRAN) 8 MG tablet Take 1 tablet (8 mg total) by mouth every 8 (eight) hours as needed (Nausea or vomiting).  . pantoprazole  (PROTONIX) 40 MG tablet TAKE 1 TABLET (40 MG TOTAL) BY MOUTH DAILY BEFORE BREAKFAST.  Marland Kitchen pomalidomide (POMALYST) 2 MG capsule Take 1 capsule (2 mg total) by mouth daily. Take with water on days 1-21. Repeat every 28 days.  . prochlorperazine (COMPAZINE) 10 MG tablet Take 1 tablet (10 mg total) by mouth every 6 (six) hours as needed (Nausea or vomiting).  . [DISCONTINUED] chlorpheniramine-HYDROcodone (TUSSIONEX PENNKINETIC ER) 10-8 MG/5ML SUER Take 5 mLs by mouth every 12 (twelve) hours as needed for cough.   No facility-administered encounter medications on file as of 07/26/2018.      REVIEW OF SYSTEMS  : All other systems reviewed and negative except where noted in the History of Present Illness.   PHYSICAL EXAM: BP 120/80   Pulse 72   Ht 5' 2.5" (1.588 m)   Wt 157 lb (71.2 kg)   BMI 28.26 kg/m  General: Well developed white female in no acute distress Head: Normocephalic and atraumatic Eyes:  Sclerae anicteric, conjunctiva pink. Ears: Normal auditory acuity Lungs: Clear throughout to auscultation; no increased WOB. Heart: Regular rate and rhythm; no M/R/G. Abdomen: Soft, non-distended.  BS present.  Non-tender. Musculoskeletal: Symmetrical with no gross deformities  Skin: No lesions on visible extremities Extremities: No edema  Neurological: Alert oriented x 4, grossly non-focal Psychological:  Alert and cooperative. Normal mood and affect  ASSESSMENT AND PLAN: *73 year old female with complaints of belching, early satiety, reflux.  She has a large hiatal hernia that is 6 cm in size.  We discussed that this could very well be causing some of her symptoms and the only way ultimately to fix this would be to seek surgical evaluation.  She does not know she is interested in that at this time.  We are going to just try to adjust some of her medications for now.  Will increase her pantoprazole to 40 mg twice daily.  She will follow-up with Dr. Carlean Purl in 4 to 6 weeks to get an update on  her symptoms.   CC:  [name], [name], MD  Agree with Ms. Aniesha Haughn's management.  Gatha Mayer, MD, [facility]

## 2018-07-29 ENCOUNTER — Telehealth: Payer: Self-pay | Admitting: Hematology and Oncology

## 2018-07-29 NOTE — Telephone Encounter (Signed)
Scheduled appt per 1/31 sch message - spoke with patient . Pt is aware of apt date and time

## 2018-07-30 ENCOUNTER — Inpatient Hospital Stay: Payer: Medicare HMO | Attending: Hematology and Oncology

## 2018-07-30 ENCOUNTER — Inpatient Hospital Stay: Payer: Medicare HMO

## 2018-07-30 DIAGNOSIS — R911 Solitary pulmonary nodule: Secondary | ICD-10-CM | POA: Diagnosis not present

## 2018-07-30 DIAGNOSIS — Z79899 Other long term (current) drug therapy: Secondary | ICD-10-CM | POA: Insufficient documentation

## 2018-07-30 DIAGNOSIS — C9002 Multiple myeloma in relapse: Secondary | ICD-10-CM

## 2018-07-30 DIAGNOSIS — Z9484 Stem cells transplant status: Secondary | ICD-10-CM | POA: Insufficient documentation

## 2018-07-30 DIAGNOSIS — C946 Myelodysplastic disease, not classified: Secondary | ICD-10-CM

## 2018-07-30 DIAGNOSIS — Z9221 Personal history of antineoplastic chemotherapy: Secondary | ICD-10-CM | POA: Diagnosis not present

## 2018-07-30 DIAGNOSIS — Z5112 Encounter for antineoplastic immunotherapy: Secondary | ICD-10-CM | POA: Diagnosis present

## 2018-07-30 DIAGNOSIS — Z7982 Long term (current) use of aspirin: Secondary | ICD-10-CM | POA: Diagnosis not present

## 2018-07-30 DIAGNOSIS — R062 Wheezing: Secondary | ICD-10-CM | POA: Insufficient documentation

## 2018-07-30 DIAGNOSIS — K449 Diaphragmatic hernia without obstruction or gangrene: Secondary | ICD-10-CM | POA: Insufficient documentation

## 2018-07-30 DIAGNOSIS — D469 Myelodysplastic syndrome, unspecified: Secondary | ICD-10-CM

## 2018-07-30 LAB — CBC WITH DIFFERENTIAL (CANCER CENTER ONLY)
Abs Immature Granulocytes: 0.01 10*3/uL (ref 0.00–0.07)
Basophils Absolute: 0 10*3/uL (ref 0.0–0.1)
Basophils Relative: 1 %
Eosinophils Absolute: 0 10*3/uL (ref 0.0–0.5)
Eosinophils Relative: 0 %
HCT: 40.2 % (ref 36.0–46.0)
Hemoglobin: 13.6 g/dL (ref 12.0–15.0)
Immature Granulocytes: 0 %
Lymphocytes Relative: 58 %
Lymphs Abs: 2.7 10*3/uL (ref 0.7–4.0)
MCH: 35.3 pg — ABNORMAL HIGH (ref 26.0–34.0)
MCHC: 33.8 g/dL (ref 30.0–36.0)
MCV: 104.4 fL — ABNORMAL HIGH (ref 80.0–100.0)
Monocytes Absolute: 0.4 10*3/uL (ref 0.1–1.0)
Monocytes Relative: 8 %
Neutro Abs: 1.5 10*3/uL — ABNORMAL LOW (ref 1.7–7.7)
Neutrophils Relative %: 33 %
Platelet Count: 219 10*3/uL (ref 150–400)
RBC: 3.85 MIL/uL — ABNORMAL LOW (ref 3.87–5.11)
RDW: 13.8 % (ref 11.5–15.5)
WBC Count: 4.5 10*3/uL (ref 4.0–10.5)
nRBC: 0 % (ref 0.0–0.2)

## 2018-07-30 LAB — CMP (CANCER CENTER ONLY)
ALT: 19 U/L (ref 0–44)
AST: 18 U/L (ref 15–41)
Albumin: 3.5 g/dL (ref 3.5–5.0)
Alkaline Phosphatase: 71 U/L (ref 38–126)
Anion gap: 9 (ref 5–15)
BUN: 8 mg/dL (ref 8–23)
CO2: 29 mmol/L (ref 22–32)
Calcium: 8.7 mg/dL — ABNORMAL LOW (ref 8.9–10.3)
Chloride: 102 mmol/L (ref 98–111)
Creatinine: 0.68 mg/dL (ref 0.44–1.00)
GFR, Est AFR Am: >60 mL/min (ref >60)
GFR, Estimated: >60 mL/min (ref >60)
Glucose, Bld: 90 mg/dL (ref 70–99)
Potassium: 3.1 mmol/L — ABNORMAL LOW (ref 3.5–5.1)
Sodium: 140 mmol/L (ref 135–145)
Total Bilirubin: 0.5 mg/dL (ref 0.3–1.2)
Total Protein: 5.9 g/dL — ABNORMAL LOW (ref 6.5–8.1)

## 2018-07-30 MED ORDER — SODIUM CHLORIDE 0.9% FLUSH
10.0000 mL | Freq: Once | INTRAVENOUS | Status: AC
  Administered 2018-07-30: 10 mL
  Filled 2018-07-30: qty 10

## 2018-07-30 MED ORDER — HEPARIN SOD (PORK) LOCK FLUSH 100 UNIT/ML IV SOLN
500.0000 [IU] | Freq: Once | INTRAVENOUS | Status: AC
  Administered 2018-07-30: 500 [IU]
  Filled 2018-07-30: qty 5

## 2018-07-31 ENCOUNTER — Telehealth: Payer: Self-pay

## 2018-07-31 ENCOUNTER — Inpatient Hospital Stay: Payer: Medicare HMO

## 2018-07-31 VITALS — BP 112/67 | HR 65 | Temp 98.4°F | Resp 18

## 2018-07-31 DIAGNOSIS — Z5112 Encounter for antineoplastic immunotherapy: Secondary | ICD-10-CM | POA: Diagnosis not present

## 2018-07-31 DIAGNOSIS — C9002 Multiple myeloma in relapse: Secondary | ICD-10-CM

## 2018-07-31 LAB — KAPPA/LAMBDA LIGHT CHAINS
Kappa free light chain: 13.1 mg/L (ref 3.3–19.4)
Kappa, lambda light chain ratio: 0.7 (ref 0.26–1.65)
Lambda free light chains: 18.6 mg/L (ref 5.7–26.3)

## 2018-07-31 MED ORDER — HEPARIN SOD (PORK) LOCK FLUSH 100 UNIT/ML IV SOLN
500.0000 [IU] | Freq: Once | INTRAVENOUS | Status: AC | PRN
  Administered 2018-07-31: 500 [IU]
  Filled 2018-07-31: qty 5

## 2018-07-31 MED ORDER — SODIUM CHLORIDE 0.9% FLUSH
10.0000 mL | INTRAVENOUS | Status: DC | PRN
  Administered 2018-07-31: 10 mL
  Filled 2018-07-31: qty 10

## 2018-07-31 MED ORDER — ACETAMINOPHEN 325 MG PO TABS
650.0000 mg | ORAL_TABLET | Freq: Once | ORAL | Status: AC
  Administered 2018-07-31: 650 mg via ORAL

## 2018-07-31 MED ORDER — SODIUM CHLORIDE 0.9 % IV SOLN
16.0000 mg/kg | Freq: Once | INTRAVENOUS | Status: AC
  Administered 2018-07-31: 1200 mg via INTRAVENOUS
  Filled 2018-07-31: qty 60

## 2018-07-31 MED ORDER — DEXAMETHASONE SODIUM PHOSPHATE 10 MG/ML IJ SOLN
10.0000 mg | Freq: Once | INTRAMUSCULAR | Status: AC
  Administered 2018-07-31: 10 mg via INTRAVENOUS

## 2018-07-31 MED ORDER — PROCHLORPERAZINE MALEATE 10 MG PO TABS
10.0000 mg | ORAL_TABLET | Freq: Once | ORAL | Status: AC
  Administered 2018-07-31: 10 mg via ORAL

## 2018-07-31 MED ORDER — DIPHENHYDRAMINE HCL 25 MG PO CAPS
ORAL_CAPSULE | ORAL | Status: AC
  Filled 2018-07-31: qty 2

## 2018-07-31 MED ORDER — SODIUM CHLORIDE 0.9 % IV SOLN
Freq: Once | INTRAVENOUS | Status: AC
  Administered 2018-07-31: 09:00:00 via INTRAVENOUS
  Filled 2018-07-31: qty 250

## 2018-07-31 MED ORDER — ACETAMINOPHEN 325 MG PO TABS
ORAL_TABLET | ORAL | Status: AC
  Filled 2018-07-31: qty 2

## 2018-07-31 MED ORDER — DIPHENHYDRAMINE HCL 25 MG PO CAPS
50.0000 mg | ORAL_CAPSULE | Freq: Once | ORAL | Status: AC
  Administered 2018-07-31: 50 mg via ORAL

## 2018-07-31 MED ORDER — DEXAMETHASONE SODIUM PHOSPHATE 10 MG/ML IJ SOLN
INTRAMUSCULAR | Status: AC
  Filled 2018-07-31: qty 1

## 2018-07-31 MED ORDER — PROCHLORPERAZINE MALEATE 10 MG PO TABS
ORAL_TABLET | ORAL | Status: AC
  Filled 2018-07-31: qty 1

## 2018-07-31 NOTE — Patient Instructions (Signed)
[facility] Discharge Instructions for Patients Receiving Chemotherapy  Today you received the following chemotherapy agents Daratumumab (DARZALEX).  To help prevent nausea and vomiting after your treatment, we encourage you to take your nausea medication as prescribed.   If you develop nausea and vomiting that is not controlled by your nausea medication, call the clinic.   BELOW ARE SYMPTOMS THAT SHOULD BE REPORTED IMMEDIATELY:  *FEVER GREATER THAN 100.5 F  *CHILLS WITH OR WITHOUT FEVER  NAUSEA AND VOMITING THAT IS NOT CONTROLLED WITH YOUR NAUSEA MEDICATION  *UNUSUAL SHORTNESS OF BREATH  *UNUSUAL BRUISING OR BLEEDING  TENDERNESS IN MOUTH AND THROAT WITH OR WITHOUT PRESENCE OF ULCERS  *URINARY PROBLEMS  *BOWEL PROBLEMS  UNUSUAL RASH Items with * indicate a potential emergency and should be followed up as soon as possible.  Feel free to call the clinic should you have any questions or concerns. The clinic phone number is [phone].  Please show the CHEMO ALERT CARD at check-in to the Emergency Department and triage nurse.   

## 2018-07-31 NOTE — Telephone Encounter (Signed)
-----   Message from [name], MD sent at 07/30/2018  4:41 PM EST ----- Regarding: low potassium She will be here tomorrow for treatment Please advise on potassium rich diet ----- Message ----- From: Interface, Lab In [place] Sent: 07/30/2018   2:09 PM EST To: [name], MD

## 2018-07-31 NOTE — Progress Notes (Signed)
Patient was provided with food list for Potassium rich foods. Per Dr. Alvy Bimler ok for treatment today.

## 2018-07-31 NOTE — Telephone Encounter (Signed)
Given copy of lab work and copy of potassium results in the infusion room.

## 2018-08-01 ENCOUNTER — Telehealth: Payer: Self-pay

## 2018-08-01 LAB — MULTIPLE MYELOMA PANEL, SERUM
Albumin SerPl Elph-Mcnc: 3.4 g/dL (ref 2.9–4.4)
Albumin/Glob SerPl: 1.7 (ref 0.7–1.7)
Alpha 1: 0.2 g/dL (ref 0.0–0.4)
Alpha2 Glob SerPl Elph-Mcnc: 0.8 g/dL (ref 0.4–1.0)
B-Globulin SerPl Elph-Mcnc: 0.7 g/dL (ref 0.7–1.3)
Gamma Glob SerPl Elph-Mcnc: 0.4 g/dL (ref 0.4–1.8)
Globulin, Total: 2.1 g/dL — ABNORMAL LOW (ref 2.2–3.9)
IgA: 117 mg/dL (ref 64–422)
IgG (Immunoglobin G), Serum: 430 mg/dL — ABNORMAL LOW (ref 700–1600)
IgM (Immunoglobulin M), Srm: 78 mg/dL (ref 26–217)
M Protein SerPl Elph-Mcnc: 0.1 g/dL — ABNORMAL HIGH
Total Protein ELP: 5.5 g/dL — ABNORMAL LOW (ref 6.0–8.5)

## 2018-08-01 NOTE — Telephone Encounter (Signed)
-----   Message from [name], MD sent at 08/01/2018  1:42 PM EST ----- Regarding: myeloma panel is ok Please call her with tests results Myeloma panel is ok ----- Message ----- From: Interface, Lab In [place] Sent: 07/30/2018   2:09 PM EST To: [name], MD

## 2018-08-01 NOTE — Telephone Encounter (Signed)
Called with below message. She verbalized understanding. 

## 2018-08-07 ENCOUNTER — Other Ambulatory Visit: Payer: Medicare HMO

## 2018-08-07 ENCOUNTER — Ambulatory Visit: Payer: Medicare HMO

## 2018-08-12 ENCOUNTER — Encounter: Payer: Self-pay | Admitting: Hematology and Oncology

## 2018-08-13 ENCOUNTER — Encounter: Payer: Self-pay | Admitting: Hematology and Oncology

## 2018-08-13 DIAGNOSIS — D469 Myelodysplastic syndrome, unspecified: Secondary | ICD-10-CM | POA: Diagnosis not present

## 2018-08-13 DIAGNOSIS — Z9481 Bone marrow transplant status: Secondary | ICD-10-CM | POA: Diagnosis not present

## 2018-08-13 DIAGNOSIS — C9000 Multiple myeloma not having achieved remission: Secondary | ICD-10-CM | POA: Diagnosis not present

## 2018-08-14 ENCOUNTER — Telehealth: Payer: Self-pay

## 2018-08-14 ENCOUNTER — Inpatient Hospital Stay: Payer: Medicare HMO

## 2018-08-14 ENCOUNTER — Telehealth: Payer: Self-pay | Admitting: Hematology and Oncology

## 2018-08-14 ENCOUNTER — Other Ambulatory Visit: Payer: Self-pay | Admitting: Hematology and Oncology

## 2018-08-14 ENCOUNTER — Other Ambulatory Visit: Payer: Self-pay

## 2018-08-14 ENCOUNTER — Ambulatory Visit (HOSPITAL_COMMUNITY)
Admission: RE | Admit: 2018-08-14 | Discharge: 2018-08-14 | Disposition: A | Discharge status: Home / Self Care | Payer: Medicare HMO | Source: Ambulatory Visit | Attending: Hematology and Oncology | Admitting: Hematology and Oncology

## 2018-08-14 ENCOUNTER — Inpatient Hospital Stay: Payer: Medicare HMO | Admitting: Hematology and Oncology

## 2018-08-14 VITALS — BP 131/74 | HR 78 | Temp 98.2°F | Resp 17 | Ht 62.5 in | Wt 167.0 lb

## 2018-08-14 VITALS — BP 101/71 | HR 69 | Temp 98.4°F | Resp 18

## 2018-08-14 DIAGNOSIS — R062 Wheezing: Secondary | ICD-10-CM | POA: Diagnosis not present

## 2018-08-14 DIAGNOSIS — Z9221 Personal history of antineoplastic chemotherapy: Secondary | ICD-10-CM

## 2018-08-14 DIAGNOSIS — Z79899 Other long term (current) drug therapy: Secondary | ICD-10-CM

## 2018-08-14 DIAGNOSIS — C9002 Multiple myeloma in relapse: Secondary | ICD-10-CM

## 2018-08-14 DIAGNOSIS — K449 Diaphragmatic hernia without obstruction or gangrene: Secondary | ICD-10-CM | POA: Diagnosis not present

## 2018-08-14 DIAGNOSIS — Z7982 Long term (current) use of aspirin: Secondary | ICD-10-CM | POA: Diagnosis not present

## 2018-08-14 DIAGNOSIS — Z9484 Stem cells transplant status: Secondary | ICD-10-CM | POA: Diagnosis not present

## 2018-08-14 DIAGNOSIS — R911 Solitary pulmonary nodule: Secondary | ICD-10-CM | POA: Diagnosis not present

## 2018-08-14 DIAGNOSIS — Z5112 Encounter for antineoplastic immunotherapy: Secondary | ICD-10-CM | POA: Diagnosis not present

## 2018-08-14 MED ORDER — POMALIDOMIDE 2 MG PO CAPS: 2.0000 mg | ORAL_CAPSULE | Freq: Every day | ORAL | 0 refills | Status: DC

## 2018-08-14 MED ORDER — ACETAMINOPHEN 325 MG PO TABS
650.0000 mg | ORAL_TABLET | Freq: Once | ORAL | Status: AC
  Administered 2018-08-14: 650 mg via ORAL

## 2018-08-14 MED ORDER — ACETAMINOPHEN 325 MG PO TABS
ORAL_TABLET | ORAL | Status: AC
  Filled 2018-08-14: qty 2

## 2018-08-14 MED ORDER — SODIUM CHLORIDE 0.9% FLUSH
10.0000 mL | INTRAVENOUS | Status: DC | PRN
  Administered 2018-08-14: 10 mL
  Filled 2018-08-14: qty 10

## 2018-08-14 MED ORDER — DEXAMETHASONE SODIUM PHOSPHATE 10 MG/ML IJ SOLN
INTRAMUSCULAR | Status: AC
  Filled 2018-08-14: qty 1

## 2018-08-14 MED ORDER — DIPHENHYDRAMINE HCL 25 MG PO CAPS
ORAL_CAPSULE | ORAL | Status: AC
  Filled 2018-08-14: qty 2

## 2018-08-14 MED ORDER — DIPHENHYDRAMINE HCL 25 MG PO CAPS
50.0000 mg | ORAL_CAPSULE | Freq: Once | ORAL | Status: AC
  Administered 2018-08-14: 50 mg via ORAL

## 2018-08-14 MED ORDER — PROCHLORPERAZINE MALEATE 10 MG PO TABS
ORAL_TABLET | ORAL | Status: AC
  Filled 2018-08-14: qty 1

## 2018-08-14 MED ORDER — SODIUM CHLORIDE 0.9 % IV SOLN
16.0000 mg/kg | Freq: Once | INTRAVENOUS | Status: AC
  Administered 2018-08-14: 1200 mg via INTRAVENOUS
  Filled 2018-08-14: qty 60

## 2018-08-14 MED ORDER — HEPARIN SOD (PORK) LOCK FLUSH 100 UNIT/ML IV SOLN
500.0000 [IU] | Freq: Once | INTRAVENOUS | Status: AC | PRN
  Administered 2018-08-14: 500 [IU]
  Filled 2018-08-14: qty 5

## 2018-08-14 MED ORDER — DEXAMETHASONE SODIUM PHOSPHATE 10 MG/ML IJ SOLN
10.0000 mg | Freq: Once | INTRAMUSCULAR | Status: AC
  Administered 2018-08-14: 10 mg via INTRAVENOUS

## 2018-08-14 MED ORDER — PROCHLORPERAZINE MALEATE 10 MG PO TABS
10.0000 mg | ORAL_TABLET | Freq: Once | ORAL | Status: AC
  Administered 2018-08-14: 10 mg via ORAL

## 2018-08-14 MED ORDER — SODIUM CHLORIDE 0.9 % IV SOLN
Freq: Once | INTRAVENOUS | Status: AC
  Administered 2018-08-14: 11:00:00 via INTRAVENOUS
  Filled 2018-08-14: qty 250

## 2018-08-14 NOTE — Telephone Encounter (Signed)
Patient decline avs and calendar °

## 2018-08-14 NOTE — Patient Instructions (Signed)
[facility] Discharge Instructions for Patients Receiving Chemotherapy  Today you received the following chemotherapy agents Daratumumab(Darzalex)  To help prevent nausea and vomiting after your treatment, we encourage you to take your nausea medication as directed.    If you develop nausea and vomiting that is not controlled by your nausea medication, call the clinic.   BELOW ARE SYMPTOMS THAT SHOULD BE REPORTED IMMEDIATELY:  *FEVER GREATER THAN 100.5 F  *CHILLS WITH OR WITHOUT FEVER  NAUSEA AND VOMITING THAT IS NOT CONTROLLED WITH YOUR NAUSEA MEDICATION  *UNUSUAL SHORTNESS OF BREATH  *UNUSUAL BRUISING OR BLEEDING  TENDERNESS IN MOUTH AND THROAT WITH OR WITHOUT PRESENCE OF ULCERS  *URINARY PROBLEMS  *BOWEL PROBLEMS  UNUSUAL RASH Items with * indicate a potential emergency and should be followed up as soon as possible.  Feel free to call the clinic should you have any questions or concerns. The clinic phone number is [phone].  Please show the CHEMO ALERT CARD at check-in to the Emergency Department and triage nurse.   

## 2018-08-14 NOTE — Telephone Encounter (Signed)
-----   Message from [name], MD sent at 08/14/2018 11:58 AM EST ----- Regarding: Chest X ray is normal Let her know that spot from 2 months ago had disappeared

## 2018-08-14 NOTE — Telephone Encounter (Signed)
Called and given below message to [place], RN to give to patient.

## 2018-08-14 NOTE — Progress Notes (Signed)
Updated pt on prescription refill and scan results per Dr. Alvy Bimler. Pt verbalized understanding and denied any further questions/concerns.

## 2018-08-14 NOTE — Progress Notes (Signed)
Per Dr. Alvy Bimler: OK to treat using labs from [place] on 08/13/18 (no need to redraw)--results under "Care Everywhere" tab.   Also, no need to supplement Magnesium level

## 2018-08-15 ENCOUNTER — Encounter: Payer: Self-pay | Admitting: Hematology and Oncology

## 2018-08-15 NOTE — Assessment & Plan Note (Signed)
I have reviewed documentation at [facility] The patient has achieved very good partial remission She will continue daratumumab along with pomalidomide She has completely taper off dexamethasone She will continue acyclovir for antimicrobial prophylaxis She will continue aspirin for DVT prophylaxis She will continue calcium with vitamin D and IV bisphosphonate every 3 months

## 2018-08-15 NOTE — Progress Notes (Signed)
[place] OFFICE PROGRESS NOTE  Patient Care Team: [name], [name], MD as PCP - General (Internal Medicine) [name], MD as Referring Physician (Hematology and Oncology) [name], MD as Consulting Physician (Internal Medicine) [name], [name], MD as Consulting Physician (Infectious Diseases) [name], MD as Consulting Physician (Hematology and Oncology) [name], NP as Nurse Practitioner (Hematology and Oncology)  ASSESSMENT & PLAN:  Multiple myeloma [facility]) I have reviewed documentation at [facility] The patient has achieved very good partial remission She will continue daratumumab along with pomalidomide She has completely taper off dexamethasone She will continue acyclovir for antimicrobial prophylaxis She will continue aspirin for DVT prophylaxis She will continue calcium with vitamin D and IV bisphosphonate every 3 months  Lung nodule seen on imaging study She had abnormal lung nodules seen back in December Repeat chest x-ray showed resolution.  She has minor wheeze on exam I recommend over-the-counter antihistamine   Orders Placed This Encounter  Procedures  . DG Chest 2 View    Standing Status:   Future    Number of Occurrences:   1    Standing Expiration Date:   09/18/2019    Order Specific Question:   Reason for exam:    Answer:   lung nodule, mild wheeze    Order Specific Question:   Preferred imaging location?    Answer:   [facility]    INTERVAL HISTORY: Please see below for problem oriented charting. She returns for further follow-up after recent evaluation at [facility] She denies recent infection, fever or chills No new bone pain She is due for refill of Pomalyst  SUMMARY OF ONCOLOGIC HISTORY: Oncology History   Multiple myeloma, Ig A Lambda, M spike 3.54 grams, Calcium 9.2, Creatinine 0.8, Beta 2 microglobulin 4.52, IgA 4840 mg/dL, lambda light chain 75.4, albumin 3.6, hemoglobin 9.7,  platelet 115    Primary site: Multiple Myeloma   Staging method: AJCC 6th Edition   Clinical: Stage IIA signed by [name], MD on 11/07/2013  2:46 PM   Summary: Stage IIA        Multiple myeloma ([place])   10/31/2013 Bone Marrow Biopsy    Bone marrow biopsy confirmed multiple myeloma with 40% bone marrow involvement. Skeletal survey showed minimal lesions in her score with generalized demineralization    11/10/2013 - 02/13/2014 Chemotherapy    The patient is started on induction chemotherapy with weekly dexamethasone 40 mg by mouth as well as Velcade subcutaneous injection on days 1, 4, 8 and 11. On 11/21/2013, she was started on monthly Zometa.    12/23/2013 Adverse Reaction    The dose of Velcade was reduced due to thrombocytopenia.    01/28/2014 - 04/07/2014 Chemotherapy    Revlimid is added. Treatment was discontinued due to lack of response.    02/24/2014 - 04/07/2014 Chemotherapy    Due to worsening peripheral neuropathy, Velcade injection is changed to once a week. Revlimid was given 21 days on, 7 days off.    04/07/2014 - 04/10/2014 Chemotherapy    Revlimid was discontinued due to lack of response. Chemotherapy was changed back to Velcade injection twice a week, 2 weeks on 1 week off. Her treatment was switched to to minimum response    04/20/2014 - 06/02/2014 Chemotherapy    chemotherapy is switched to Carfilzomib, Cytoxan and dexamethasone.    04/22/2014 Procedure    she has placement of port for chemotherapy.    06/01/2014 Tumor Marker  Bloodwork show that she has greater than partial response    06/23/2014 Bone Marrow Biopsy    Bone marrow biopsy show 5-10% residual plasma cells, normal cytogenetics and FISH    07/07/2014 Procedure    She had stem cell collection    07/22/2014 - 07/22/2014 Chemotherapy    She had high-dose chemotherapy with melphalan    07/23/2014 Bone Marrow Transplant    She had bone marrow transplant in autologous fashion at [facility]    10/20/2014 -  03/24/2015 Chemotherapy     she received chemotherapy with Kyprolis, Revlimid and dexamethasone    10/22/2014 Procedure    She has port placement    01/19/2015 Tumor Marker    IgA lambda M spike at 0.4 g     01/20/2015 Miscellaneous    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 No cource was found and fever resolved with IV vancomycin and meropenem    09/13/2015 Bone Marrow Biopsy    Bone marrow biopsy showed no increased blasts, 3-4 % plasma cells    03/02/2016 Bone Marrow Biopsy    Bone marrow biopsy at [facility] showed normocellular (30%) bone marrow with trilineage hematopoiesis. No significant increase in blasts. No significant increase in plasma cells.    05/12/2016 Imaging    DEXA scan at [place] showed osteopenia    10/24/2016 Imaging    Skeletal survey at [facility], no new lesions    12/07/2017 Imaging    No focal abnormality noted to suggest myeloma. Exam is stable from prior exam.    03/01/2018 Procedure    Successful 8 French right internal jugular vein power port placement with its tip at the SVC/RA junction.    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    10/31/2015 Miscellaneous    She has engrafted     REVIEW OF SYSTEMS:   Constitutional: Denies fevers, chills or abnormal weight loss Eyes: Denies blurriness of vision Ears, nose, mouth, throat, and face: Denies mucositis or sore throat Respiratory: Denies cough, dyspnea or wheezes Cardiovascular: Denies palpitation, chest discomfort or lower extremity swelling Gastrointestinal:  Denies nausea, heartburn or change in bowel habits Skin: Denies abnormal skin rashes Lymphatics: Denies new lymphadenopathy or easy bruising Neurological:Denies numbness, tingling  or new weaknesses Behavioral/Psych: Mood is stable, no new changes  All other systems were reviewed with the patient and are negative.  I have reviewed the past medical history, past surgical history, social history and family history with the patient and they are unchanged from previous note.  ALLERGIES:  has No Known Allergies.  MEDICATIONS:  Current Outpatient Medications  Medication Sig Dispense Refill  . acyclovir (ZOVIRAX) 400 MG tablet Take 1 tablet (400 mg total) by mouth 2 (two) times daily. 180 tablet 11  . calcipotriene (DOVONOX) 0.005 % cream     . calcium carbonate (TUMS - DOSED IN MG ELEMENTAL CALCIUM) 500 MG chewable tablet Chew 1 tablet by mouth daily.    . carvedilol (COREG) 3.125 MG tablet TAKE 1 TABLET (3.125 MG TOTAL) BY MOUTH 2 (TWO) TIMES DAILY WITH A MEAL. 180 tablet 3  . Cholecalciferol (VITAMIN D-1000 MAX ST) 1000 units tablet Take 1,000 Units by mouth daily.    Marland Kitchen docusate sodium (COLACE) 100 MG capsule Take 100 mg  by mouth daily as needed for mild constipation.     . lidocaine-prilocaine (EMLA) cream Apply to affected area once 30 g 3  . loperamide (IMODIUM) 2 MG capsule Take by mouth as needed for diarrhea or loose stools. As needed only    . mirtazapine (REMERON) 30 MG tablet Take 1 tablet by mouth at bedtime.    . Multiple Vitamin (MULTIVITAMIN WITH MINERALS) TABS tablet Take 1 tablet by mouth daily.    . Multiple Vitamins-Minerals (ICAPS AREDS 2) CAPS Take 1 capsule by mouth 2 (two) times daily.    . ondansetron (ZOFRAN) 8 MG tablet Take 1 tablet (8 mg total) by mouth every 8 (eight) hours as needed (Nausea or vomiting). 30 tablet 1  . pantoprazole (PROTONIX) 40 MG tablet Take 1 tablet (40 mg total) by mouth 2 (two) times daily. Take 30-60 minutes before breakfast and dinner 180 tablet 2  . pomalidomide (POMALYST) 2 MG capsule Take 1 capsule (2 mg total) by mouth daily. Take with water on days 1-21. Repeat every 28 days. 21 capsule 0  . prochlorperazine (COMPAZINE) 10 MG tablet Take 1 tablet (10 mg total) by mouth every 6 (six) hours as needed (Nausea or vomiting). 30 tablet 1   No current facility-administered medications for this visit.     PHYSICAL EXAMINATION: ECOG PERFORMANCE STATUS: 0 - Asymptomatic  Vitals:   08/14/18 1000  BP: 131/74  Pulse: 78  Resp: 17  Temp: 98.2 F (36.8 C)  SpO2: 98%   Filed Weights   08/14/18 1000  Weight: 167 lb (75.8 kg)    GENERAL:alert, no distress and comfortable SKIN: skin color, texture, turgor are normal, no rashes or significant lesions EYES: normal, Conjunctiva are pink and non-injected, sclera clear OROPHARYNX:no exudate, no erythema and lips, buccal mucosa, and tongue normal  NECK: supple, thyroid normal size, non-tender, without nodularity LYMPH:  no palpable lymphadenopathy in the cervical, axillary or inguinal LUNGS: Mild scattered wheeze noted HEART: regular rate & rhythm and no murmurs and no lower extremity edema ABDOMEN:abdomen soft,  non-tender and normal bowel sounds Musculoskeletal:no cyanosis of digits and no clubbing  NEURO: alert & oriented x 3 with fluent speech, no focal motor/sensory deficits  LABORATORY DATA:  I have reviewed the data as listed    Component Value Date/Time   NA 140 07/30/2018 1345   NA 142 01/04/2017 0902   K 3.1 (L) 07/30/2018 1345   K 3.9 01/04/2017 0902   CL 102 07/30/2018 1345  CO2 29 07/30/2018 1345   CO2 28 01/04/2017 0902   GLUCOSE 90 07/30/2018 1345   GLUCOSE 92 01/04/2017 0902   BUN 8 07/30/2018 1345   BUN 9.5 01/04/2017 0902   CREATININE 0.68 07/30/2018 1345   CREATININE 0.7 01/04/2017 0902   CALCIUM 8.7 (L) 07/30/2018 1345   CALCIUM 9.0 01/04/2017 0902   PROT 5.9 (L) 07/30/2018 1345   PROT 6.0 (L) 01/04/2017 0902   PROT 5.8 (L) 01/04/2017 0902   ALBUMIN 3.5 07/30/2018 1345   ALBUMIN 3.4 (L) 01/04/2017 0902   AST 18 07/30/2018 1345   AST 21 01/04/2017 0902   ALT 19 07/30/2018 1345   ALT 16 01/04/2017 0902   ALKPHOS 71 07/30/2018 1345   ALKPHOS 101 01/04/2017 0902   BILITOT 0.5 07/30/2018 1345   BILITOT 0.56 01/04/2017 0902   GFRNONAA >60 07/30/2018 1345   GFRAA >60 07/30/2018 1345    No results found for: SPEP, UPEP  Lab Results  Component Value Date   WBC 4.5 07/30/2018   NEUTROABS 1.5 (L) 07/30/2018   HGB 13.6 07/30/2018   HCT 40.2 07/30/2018   MCV 104.4 (H) 07/30/2018   PLT 219 07/30/2018      Chemistry      Component Value Date/Time   NA 140 07/30/2018 1345   NA 142 01/04/2017 0902   K 3.1 (L) 07/30/2018 1345   K 3.9 01/04/2017 0902   CL 102 07/30/2018 1345   CO2 29 07/30/2018 1345   CO2 28 01/04/2017 0902   BUN 8 07/30/2018 1345   BUN 9.5 01/04/2017 0902   CREATININE 0.68 07/30/2018 1345   CREATININE 0.7 01/04/2017 0902      Component Value Date/Time   CALCIUM 8.7 (L) 07/30/2018 1345   CALCIUM 9.0 01/04/2017 0902   ALKPHOS 71 07/30/2018 1345   ALKPHOS 101 01/04/2017 0902   AST 18 07/30/2018 1345   AST 21 01/04/2017 0902   ALT 19  07/30/2018 1345   ALT 16 01/04/2017 0902   BILITOT 0.5 07/30/2018 1345   BILITOT 0.56 01/04/2017 0902       RADIOGRAPHIC STUDIES: I have personally reviewed the radiological images as listed and agreed with the findings in the report. Dg Chest 2 View  Result Date: 08/14/2018 CLINICAL DATA:  F/u lung nodule seen on prior imaging, wheezing heard today on exam, hx multiple myeloma EXAM: CHEST - 2 VIEW COMPARISON:  06/25/2018 FINDINGS: Heart size is normal. No focal consolidations or pleural effusions. No pulmonary edema. Stable appearance of hiatal hernia. Nodular opacity near the RIGHT-sided Port-A-Cath is no longer apparent. Stable appearance of granuloma in the RIGHT LOWER lobe. IMPRESSION: No active cardiopulmonary disease. Electronically Signed   By: Nolon Nations M.D.   On: 08/14/2018 11:15    All questions were answered. The patient knows to call the clinic with any problems, questions or concerns. No barriers to learning was detected.  I spent 15 minutes counseling the patient face to face. The total time spent in the appointment was 20 minutes and more than 50% was on counseling and review of test results  [name], MD 08/15/2018 10:04 AM

## 2018-08-15 NOTE — Assessment & Plan Note (Signed)
She had abnormal lung nodules seen back in December Repeat chest x-ray showed resolution.  She has minor wheeze on exam I recommend over-the-counter antihistamine

## 2018-08-21 ENCOUNTER — Ambulatory Visit: Payer: Medicare HMO

## 2018-08-21 ENCOUNTER — Ambulatory Visit: Payer: Medicare HMO | Admitting: Hematology and Oncology

## 2018-08-21 ENCOUNTER — Other Ambulatory Visit: Payer: Medicare HMO

## 2018-08-22 ENCOUNTER — Other Ambulatory Visit: Payer: Self-pay | Admitting: Hematology and Oncology

## 2018-08-22 ENCOUNTER — Encounter: Payer: Self-pay | Admitting: Hematology and Oncology

## 2018-08-22 MED ORDER — MIRTAZAPINE 15 MG PO TABS: 15.0000 mg | ORAL_TABLET | Freq: Every day | ORAL | 1 refills | Status: DC

## 2018-08-28 ENCOUNTER — Inpatient Hospital Stay: Payer: Medicare HMO | Attending: Hematology and Oncology

## 2018-08-28 ENCOUNTER — Inpatient Hospital Stay: Payer: Medicare HMO

## 2018-08-28 VITALS — BP 131/68 | HR 66 | Temp 98.3°F | Resp 17

## 2018-08-28 DIAGNOSIS — Z79899 Other long term (current) drug therapy: Secondary | ICD-10-CM | POA: Insufficient documentation

## 2018-08-28 DIAGNOSIS — Z9484 Stem cells transplant status: Secondary | ICD-10-CM | POA: Insufficient documentation

## 2018-08-28 DIAGNOSIS — C9002 Multiple myeloma in relapse: Secondary | ICD-10-CM

## 2018-08-28 DIAGNOSIS — Z5112 Encounter for antineoplastic immunotherapy: Secondary | ICD-10-CM | POA: Insufficient documentation

## 2018-08-28 DIAGNOSIS — Z7982 Long term (current) use of aspirin: Secondary | ICD-10-CM | POA: Diagnosis not present

## 2018-08-28 DIAGNOSIS — D469 Myelodysplastic syndrome, unspecified: Secondary | ICD-10-CM

## 2018-08-28 LAB — CMP (CANCER CENTER ONLY)
ALT: 10 U/L (ref 0–44)
AST: 14 U/L — ABNORMAL LOW (ref 15–41)
Albumin: 3.4 g/dL — ABNORMAL LOW (ref 3.5–5.0)
Alkaline Phosphatase: 65 U/L (ref 38–126)
Anion gap: 9 (ref 5–15)
BUN: 11 mg/dL (ref 8–23)
CO2: 26 mmol/L (ref 22–32)
Calcium: 9.2 mg/dL (ref 8.9–10.3)
Chloride: 105 mmol/L (ref 98–111)
Creatinine: 0.87 mg/dL (ref 0.44–1.00)
GFR, Est AFR Am: >60 mL/min (ref >60)
GFR, Estimated: >60 mL/min (ref >60)
Glucose, Bld: 102 mg/dL — ABNORMAL HIGH (ref 70–99)
Potassium: 4 mmol/L (ref 3.5–5.1)
Sodium: 140 mmol/L (ref 135–145)
Total Bilirubin: 0.4 mg/dL (ref 0.3–1.2)
Total Protein: 6 g/dL — ABNORMAL LOW (ref 6.5–8.1)

## 2018-08-28 LAB — CBC WITH DIFFERENTIAL (CANCER CENTER ONLY)
Abs Immature Granulocytes: 0 10*3/uL (ref 0.00–0.07)
Basophils Absolute: 0 10*3/uL (ref 0.0–0.1)
Basophils Relative: 1 %
Eosinophils Absolute: 0 10*3/uL (ref 0.0–0.5)
Eosinophils Relative: 0 %
HCT: 39.9 % (ref 36.0–46.0)
Hemoglobin: 12.9 g/dL (ref 12.0–15.0)
Immature Granulocytes: 0 %
Lymphocytes Relative: 46 %
Lymphs Abs: 1.8 10*3/uL (ref 0.7–4.0)
MCH: 34.5 pg — ABNORMAL HIGH (ref 26.0–34.0)
MCHC: 32.3 g/dL (ref 30.0–36.0)
MCV: 106.7 fL — ABNORMAL HIGH (ref 80.0–100.0)
Monocytes Absolute: 0.4 10*3/uL (ref 0.1–1.0)
Monocytes Relative: 9 %
Neutro Abs: 1.7 10*3/uL (ref 1.7–7.7)
Neutrophils Relative %: 44 %
Platelet Count: 203 10*3/uL (ref 150–400)
RBC: 3.74 MIL/uL — ABNORMAL LOW (ref 3.87–5.11)
RDW: 14.2 % (ref 11.5–15.5)
WBC Count: 3.9 10*3/uL — ABNORMAL LOW (ref 4.0–10.5)
nRBC: 0 % (ref 0.0–0.2)

## 2018-08-28 MED ORDER — HEPARIN SOD (PORK) LOCK FLUSH 100 UNIT/ML IV SOLN
500.0000 [IU] | Freq: Once | INTRAVENOUS | Status: AC | PRN
  Administered 2018-08-28: 500 [IU]
  Filled 2018-08-28: qty 5

## 2018-08-28 MED ORDER — DIPHENHYDRAMINE HCL 25 MG PO CAPS
50.0000 mg | ORAL_CAPSULE | Freq: Once | ORAL | Status: AC
  Administered 2018-08-28: 50 mg via ORAL

## 2018-08-28 MED ORDER — SODIUM CHLORIDE 0.9 % IV SOLN
Freq: Once | INTRAVENOUS | Status: AC
  Administered 2018-08-28: 09:00:00 via INTRAVENOUS
  Filled 2018-08-28: qty 250

## 2018-08-28 MED ORDER — SODIUM CHLORIDE 0.9 % IV SOLN
16.0000 mg/kg | Freq: Once | INTRAVENOUS | Status: AC
  Administered 2018-08-28: 1200 mg via INTRAVENOUS
  Filled 2018-08-28: qty 60

## 2018-08-28 MED ORDER — DEXAMETHASONE SODIUM PHOSPHATE 10 MG/ML IJ SOLN
INTRAMUSCULAR | Status: AC
  Filled 2018-08-28: qty 1

## 2018-08-28 MED ORDER — SODIUM CHLORIDE 0.9% FLUSH
10.0000 mL | Freq: Once | INTRAVENOUS | Status: AC | PRN
  Administered 2018-08-28: 10 mL
  Filled 2018-08-28: qty 10

## 2018-08-28 MED ORDER — DIPHENHYDRAMINE HCL 25 MG PO CAPS
ORAL_CAPSULE | ORAL | Status: AC
  Filled 2018-08-28: qty 2

## 2018-08-28 MED ORDER — DEXAMETHASONE SODIUM PHOSPHATE 10 MG/ML IJ SOLN
10.0000 mg | Freq: Once | INTRAMUSCULAR | Status: AC
  Administered 2018-08-28: 10 mg via INTRAVENOUS

## 2018-08-28 MED ORDER — PROCHLORPERAZINE MALEATE 10 MG PO TABS
10.0000 mg | ORAL_TABLET | Freq: Once | ORAL | Status: AC
  Administered 2018-08-28: 10 mg via ORAL

## 2018-08-28 MED ORDER — SODIUM CHLORIDE 0.9% FLUSH
10.0000 mL | INTRAVENOUS | Status: DC | PRN
  Administered 2018-08-28: 10 mL
  Filled 2018-08-28: qty 10

## 2018-08-28 MED ORDER — PROCHLORPERAZINE MALEATE 10 MG PO TABS
ORAL_TABLET | ORAL | Status: AC
  Filled 2018-08-28: qty 1

## 2018-08-28 MED ORDER — ACETAMINOPHEN 325 MG PO TABS
ORAL_TABLET | ORAL | Status: AC
  Filled 2018-08-28: qty 2

## 2018-08-28 MED ORDER — ACETAMINOPHEN 325 MG PO TABS
650.0000 mg | ORAL_TABLET | Freq: Once | ORAL | Status: AC
  Administered 2018-08-28: 650 mg via ORAL

## 2018-08-28 NOTE — Patient Instructions (Signed)
[facility] Discharge Instructions for Patients Receiving Chemotherapy  Today you received the following chemotherapy agents Daratumumab(Darzalex)  To help prevent nausea and vomiting after your treatment, we encourage you to take your nausea medication as directed.    If you develop nausea and vomiting that is not controlled by your nausea medication, call the clinic.   BELOW ARE SYMPTOMS THAT SHOULD BE REPORTED IMMEDIATELY:  *FEVER GREATER THAN 100.5 F  *CHILLS WITH OR WITHOUT FEVER  NAUSEA AND VOMITING THAT IS NOT CONTROLLED WITH YOUR NAUSEA MEDICATION  *UNUSUAL SHORTNESS OF BREATH  *UNUSUAL BRUISING OR BLEEDING  TENDERNESS IN MOUTH AND THROAT WITH OR WITHOUT PRESENCE OF ULCERS  *URINARY PROBLEMS  *BOWEL PROBLEMS  UNUSUAL RASH Items with * indicate a potential emergency and should be followed up as soon as possible.  Feel free to call the clinic should you have any questions or concerns. The clinic phone number is [phone].  Please show the CHEMO ALERT CARD at check-in to the Emergency Department and triage nurse.   

## 2018-08-29 LAB — KAPPA/LAMBDA LIGHT CHAINS
Kappa free light chain: 19.7 mg/L — ABNORMAL HIGH (ref 3.3–19.4)
Kappa, lambda light chain ratio: 0.95 (ref 0.26–1.65)
Lambda free light chains: 20.8 mg/L (ref 5.7–26.3)

## 2018-08-30 ENCOUNTER — Telehealth: Payer: Self-pay

## 2018-08-30 NOTE — Telephone Encounter (Signed)
LVM for pt to return call to office.

## 2018-08-30 NOTE — Telephone Encounter (Signed)
Spoke with pt by phone to give her most recent myeloma panel results.

## 2018-09-03 LAB — MULTIPLE MYELOMA PANEL, SERUM
Albumin SerPl Elph-Mcnc: 3.1 g/dL (ref 2.9–4.4)
Albumin/Glob SerPl: 1.5 (ref 0.7–1.7)
Alpha 1: 0.2 g/dL (ref 0.0–0.4)
Alpha2 Glob SerPl Elph-Mcnc: 0.8 g/dL (ref 0.4–1.0)
B-Globulin SerPl Elph-Mcnc: 0.7 g/dL (ref 0.7–1.3)
Gamma Glob SerPl Elph-Mcnc: 0.5 g/dL (ref 0.4–1.8)
Globulin, Total: 2.2 g/dL (ref 2.2–3.9)
IgA: 126 mg/dL (ref 64–422)
IgG (Immunoglobin G), Serum: 452 mg/dL — ABNORMAL LOW (ref 700–1600)
IgM (Immunoglobulin M), Srm: 67 mg/dL (ref 26–217)
M Protein SerPl Elph-Mcnc: 0.1 g/dL — ABNORMAL HIGH
Total Protein ELP: 5.3 g/dL — ABNORMAL LOW (ref 6.0–8.5)

## 2018-09-06 ENCOUNTER — Encounter: Payer: Self-pay | Admitting: Internal Medicine

## 2018-09-06 ENCOUNTER — Ambulatory Visit: Payer: Medicare HMO | Admitting: Internal Medicine

## 2018-09-06 ENCOUNTER — Other Ambulatory Visit: Payer: Self-pay

## 2018-09-06 VITALS — BP 126/64 | HR 74 | Temp 99.3°F | Ht 62.0 in | Wt 162.2 lb

## 2018-09-06 DIAGNOSIS — K449 Diaphragmatic hernia without obstruction or gangrene: Secondary | ICD-10-CM | POA: Diagnosis not present

## 2018-09-06 DIAGNOSIS — N8189 Other female genital prolapse: Secondary | ICD-10-CM | POA: Diagnosis not present

## 2018-09-06 DIAGNOSIS — K648 Other hemorrhoids: Secondary | ICD-10-CM | POA: Diagnosis not present

## 2018-09-06 DIAGNOSIS — K5909 Other constipation: Secondary | ICD-10-CM

## 2018-09-06 DIAGNOSIS — K219 Gastro-esophageal reflux disease without esophagitis: Secondary | ICD-10-CM

## 2018-09-06 NOTE — Patient Instructions (Addendum)
Try taking 1-2 tablespoons of benefiber daily, handout provided.   Continue your GERD diet and try to wen down to one pantoprazole daily.   Follow up with Dr Carlean Purl as needed.    I appreciate the opportunity to care for you. Silvano Rusk, MD, [facility]

## 2018-09-06 NOTE — Progress Notes (Signed)
WILHELMINE KROGSTAD 73 y.o. 1945/07/21 [id number]  Assessment & Plan:   Encounter Diagnoses  Name Primary?   Hiatal hernia with GERD Yes   Chronic constipation    Internal and external prolapsed hemorrhoids    Pelvic floor weakness in female    She is improved.  She will continue her PPIs, see if she can go to daily again now that she is worked on lifestyle.  We briefly discussed hiatal hernia surgery, she told me she was cleared for that by her oncologist but is not interested, and I told her I would not recommend it either since she response to medication and has comorbidities etc.  She has some hemorrhoids seen, these may be contributing some to her rectal symptoms.  The pelvic floor weakness may be as well.  We are going to try Benefiber 1 to 2 tablespoons daily.  We discussed the possibility of pelvic floor physical therapy though I am not sure how much it would help it might be worth a try but try the simpler step first.  See me as needed.  Subjective:   Chief Complaint: Follow-up of reflux  HPI Ms. Riera is here for follow-up of reflux disease in the setting of a large hiatal hernia.  She saw Alonza Bogus in late January and had pantoprazole changed from daily to twice daily and with that and working on her diet and lifestyle changes she feels like she is doing quite well with that now.  However she also complains of having some chronic constipation issues and difficulty in cleansing and wiping.  She has a history of having 2 bladder suspension surgeries and now has a prolapsed bladder again but is not going to have surgery.  She has urinary leakage issues as well.  She thinks she has an external hemorrhoid that gets in the way.  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  She is followed both by Dr. Alvy Bimler here in [place]  and Dr. Rodney Cruise at [facility]   No Known Allergies Current Meds  Medication Sig   acyclovir (ZOVIRAX) 400 MG tablet Take 1 tablet (400 mg total) by mouth 2 (two) times daily.   calcipotriene (DOVONOX) 0.005 % cream    calcium carbonate (TUMS - DOSED IN MG ELEMENTAL CALCIUM) 500 MG chewable tablet Chew 1 tablet by mouth daily.   carvedilol (COREG) 3.125 MG tablet TAKE 1 TABLET (3.125 MG TOTAL) BY MOUTH 2 (TWO) TIMES DAILY WITH A MEAL.   Cholecalciferol (VITAMIN D-1000 MAX ST) 1000 units tablet Take 1,000 Units by mouth daily.   DARATUMUMAB IV Inject into the vein every 30 (thirty) days.   docusate sodium (COLACE) 100 MG capsule Take 100 mg by mouth daily as needed for mild constipation.    lidocaine-prilocaine (EMLA) cream Apply to affected area once   loperamide (IMODIUM) 2 MG capsule Take by mouth as needed for diarrhea or loose stools. As needed only   mirtazapine (REMERON) 15 MG tablet Take 1 tablet (15 mg total) by mouth at bedtime.   Multiple Vitamin (MULTIVITAMIN WITH MINERALS) TABS tablet Take 1 tablet by mouth daily.   Multiple Vitamins-Minerals (ICAPS AREDS 2) CAPS Take 1 capsule by mouth 2 (two) times daily.   ondansetron (ZOFRAN) 8 MG tablet Take 1 tablet (8 mg total) by mouth every 8 (eight) hours as needed (Nausea or vomiting).  pantoprazole (PROTONIX) 40 MG tablet Take 1 tablet (40 mg total) by mouth 2 (two) times daily. Take 30-60 minutes before breakfast and dinner   pomalidomide (POMALYST) 2 MG capsule Take 1 capsule (2 mg total) by mouth daily. Take with water on days 1-21. Repeat every 28 days.   prochlorperazine (COMPAZINE) 10 MG tablet Take 1 tablet (10 mg total) by mouth every 6 (six) hours as needed (Nausea or vomiting).   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   Hypokalemia 07/16/2015   Internal and external hemorrhoids without complication    Multiple myeloma ([facility])    Pinched nerve    Sinusitis, bacterial 07/27/2015   Spinal stenosis in cervical region    Varicose veins of lower extremities with inflammation    Past Surgical History:  Procedure Laterality Date   bladder tact  1988/1989   x2   COLONOSCOPY     IR IMAGING GUIDED PORT INSERTION  03/01/2018   PORT-A-CATH REMOVAL     SHOULDER SURGERY Right 04/06/13   right   Social History   Social History Narrative   HCPOA is daughter trishna, cwik will,  full code ( reviewed 65)   family history includes Colon cancer (age of onset: 53) in her paternal aunt; Diabetes in her father; Heart disease in her brother, father, and mother; Heart failure in her father; Hypertension in her brother; Kidney disease in her father; Skin cancer in her brother and father; Throat cancer in her father.   Review of Systems As per HPI  Objective:   Physical Exam BP 126/64 (BP Location: Left Arm, Patient Position: Sitting, Cuff Size: Normal)    Pulse 74    Temp 99.3 F (37.4 C)    Ht '5\' 2"'  (1.575 m) Comment: height measured without shoes   Wt 162 lb 4 oz (73.6 kg)    BMI 29.68 kg/m  NAD  Rectal Genella Mech CMA present  Small anal skin  Tag left NL resting and voluntary tone No mass or rectocele Appropriate simulated efecation and abd CTR

## 2018-09-09 ENCOUNTER — Encounter: Payer: Self-pay | Admitting: Hematology and Oncology

## 2018-09-11 ENCOUNTER — Encounter: Payer: Self-pay | Admitting: Hematology and Oncology

## 2018-09-12 ENCOUNTER — Other Ambulatory Visit: Payer: Self-pay

## 2018-09-12 DIAGNOSIS — C9002 Multiple myeloma in relapse: Secondary | ICD-10-CM

## 2018-09-12 MED ORDER — POMALIDOMIDE 2 MG PO CAPS: 2.0000 mg | ORAL_CAPSULE | Freq: Every day | ORAL | 0 refills | Status: DC

## 2018-09-21 ENCOUNTER — Encounter: Payer: Self-pay | Admitting: Hematology and Oncology

## 2018-09-25 ENCOUNTER — Inpatient Hospital Stay: Payer: Medicare HMO

## 2018-09-25 ENCOUNTER — Inpatient Hospital Stay: Payer: Medicare HMO | Admitting: Hematology and Oncology

## 2018-09-25 ENCOUNTER — Inpatient Hospital Stay: Payer: Medicare HMO | Attending: Hematology and Oncology

## 2018-09-25 ENCOUNTER — Telehealth: Payer: Self-pay

## 2018-09-25 ENCOUNTER — Encounter: Payer: Self-pay | Admitting: Hematology and Oncology

## 2018-09-25 ENCOUNTER — Telehealth: Payer: Self-pay | Admitting: Hematology and Oncology

## 2018-09-25 ENCOUNTER — Other Ambulatory Visit: Payer: Self-pay

## 2018-09-25 ENCOUNTER — Other Ambulatory Visit: Payer: Self-pay | Admitting: Hematology and Oncology

## 2018-09-25 VITALS — BP 153/75 | HR 66 | Temp 98.8°F | Resp 18 | Ht 62.0 in | Wt 163.6 lb

## 2018-09-25 DIAGNOSIS — R5383 Other fatigue: Secondary | ICD-10-CM | POA: Insufficient documentation

## 2018-09-25 DIAGNOSIS — Z79899 Other long term (current) drug therapy: Secondary | ICD-10-CM | POA: Insufficient documentation

## 2018-09-25 DIAGNOSIS — E538 Deficiency of other specified B group vitamins: Secondary | ICD-10-CM

## 2018-09-25 DIAGNOSIS — T451X5S Adverse effect of antineoplastic and immunosuppressive drugs, sequela: Secondary | ICD-10-CM | POA: Insufficient documentation

## 2018-09-25 DIAGNOSIS — Z9484 Stem cells transplant status: Secondary | ICD-10-CM | POA: Insufficient documentation

## 2018-09-25 DIAGNOSIS — C9002 Multiple myeloma in relapse: Secondary | ICD-10-CM

## 2018-09-25 DIAGNOSIS — Z7982 Long term (current) use of aspirin: Secondary | ICD-10-CM | POA: Diagnosis not present

## 2018-09-25 DIAGNOSIS — Z9221 Personal history of antineoplastic chemotherapy: Secondary | ICD-10-CM | POA: Diagnosis not present

## 2018-09-25 DIAGNOSIS — D801 Nonfamilial hypogammaglobulinemia: Secondary | ICD-10-CM | POA: Insufficient documentation

## 2018-09-25 DIAGNOSIS — M8580 Other specified disorders of bone density and structure, unspecified site: Secondary | ICD-10-CM

## 2018-09-25 DIAGNOSIS — G620 Drug-induced polyneuropathy: Secondary | ICD-10-CM | POA: Insufficient documentation

## 2018-09-25 DIAGNOSIS — R5381 Other malaise: Secondary | ICD-10-CM | POA: Diagnosis not present

## 2018-09-25 DIAGNOSIS — T451X5A Adverse effect of antineoplastic and immunosuppressive drugs, initial encounter: Secondary | ICD-10-CM

## 2018-09-25 DIAGNOSIS — D469 Myelodysplastic syndrome, unspecified: Secondary | ICD-10-CM

## 2018-09-25 DIAGNOSIS — Z5112 Encounter for antineoplastic immunotherapy: Secondary | ICD-10-CM | POA: Insufficient documentation

## 2018-09-25 LAB — CMP (CANCER CENTER ONLY)
ALT: 13 U/L (ref 0–44)
AST: 14 U/L — ABNORMAL LOW (ref 15–41)
Albumin: 3.4 g/dL — ABNORMAL LOW (ref 3.5–5.0)
Alkaline Phosphatase: 68 U/L (ref 38–126)
Anion gap: 9 (ref 5–15)
BUN: 15 mg/dL (ref 8–23)
CO2: 26 mmol/L (ref 22–32)
Calcium: 9.1 mg/dL (ref 8.9–10.3)
Chloride: 105 mmol/L (ref 98–111)
Creatinine: 0.8 mg/dL (ref 0.44–1.00)
GFR, Est AFR Am: >60 mL/min (ref >60)
GFR, Estimated: >60 mL/min (ref >60)
Glucose, Bld: 97 mg/dL (ref 70–99)
Potassium: 4.3 mmol/L (ref 3.5–5.1)
Sodium: 140 mmol/L (ref 135–145)
Total Bilirubin: 0.5 mg/dL (ref 0.3–1.2)
Total Protein: 6.1 g/dL — ABNORMAL LOW (ref 6.5–8.1)

## 2018-09-25 LAB — VITAMIN B12: Vitamin B-12: 192 pg/mL (ref 180–914)

## 2018-09-25 LAB — CBC WITH DIFFERENTIAL (CANCER CENTER ONLY)
Abs Immature Granulocytes: 0 10*3/uL (ref 0.00–0.07)
Basophils Absolute: 0 10*3/uL (ref 0.0–0.1)
Basophils Relative: 1 %
Eosinophils Absolute: 0 10*3/uL (ref 0.0–0.5)
Eosinophils Relative: 0 %
HCT: 41.5 % (ref 36.0–46.0)
Hemoglobin: 13.5 g/dL (ref 12.0–15.0)
Immature Granulocytes: 0 %
Lymphocytes Relative: 47 %
Lymphs Abs: 1.9 10*3/uL (ref 0.7–4.0)
MCH: 35.4 pg — ABNORMAL HIGH (ref 26.0–34.0)
MCHC: 32.5 g/dL (ref 30.0–36.0)
MCV: 108.9 fL — ABNORMAL HIGH (ref 80.0–100.0)
Monocytes Absolute: 0.4 10*3/uL (ref 0.1–1.0)
Monocytes Relative: 9 %
Neutro Abs: 1.8 10*3/uL (ref 1.7–7.7)
Neutrophils Relative %: 43 %
Platelet Count: 196 10*3/uL (ref 150–400)
RBC: 3.81 MIL/uL — ABNORMAL LOW (ref 3.87–5.11)
RDW: 14.1 % (ref 11.5–15.5)
WBC Count: 4.1 10*3/uL (ref 4.0–10.5)
nRBC: 0 % (ref 0.0–0.2)

## 2018-09-25 MED ORDER — DIPHENHYDRAMINE HCL 25 MG PO CAPS
50.0000 mg | ORAL_CAPSULE | Freq: Once | ORAL | Status: AC
  Administered 2018-09-25: 50 mg via ORAL

## 2018-09-25 MED ORDER — DEXAMETHASONE SODIUM PHOSPHATE 10 MG/ML IJ SOLN
10.0000 mg | Freq: Once | INTRAMUSCULAR | Status: AC
  Administered 2018-09-25: 10 mg via INTRAVENOUS

## 2018-09-25 MED ORDER — HEPARIN SOD (PORK) LOCK FLUSH 100 UNIT/ML IV SOLN
500.0000 [IU] | Freq: Once | INTRAVENOUS | Status: AC | PRN
  Administered 2018-09-25: 500 [IU]
  Filled 2018-09-25: qty 5

## 2018-09-25 MED ORDER — ACETAMINOPHEN 325 MG PO TABS
ORAL_TABLET | ORAL | Status: AC
  Filled 2018-09-25: qty 2

## 2018-09-25 MED ORDER — SODIUM CHLORIDE 0.9 % IV SOLN
Freq: Once | INTRAVENOUS | Status: AC
  Administered 2018-09-25: 09:00:00 via INTRAVENOUS
  Filled 2018-09-25: qty 250

## 2018-09-25 MED ORDER — SODIUM CHLORIDE 0.9 % IV SOLN
16.0000 mg/kg | Freq: Once | INTRAVENOUS | Status: AC
  Administered 2018-09-25: 1200 mg via INTRAVENOUS
  Filled 2018-09-25: qty 60

## 2018-09-25 MED ORDER — DIPHENHYDRAMINE HCL 25 MG PO CAPS
ORAL_CAPSULE | ORAL | Status: AC
  Filled 2018-09-25: qty 2

## 2018-09-25 MED ORDER — PROCHLORPERAZINE MALEATE 10 MG PO TABS
ORAL_TABLET | ORAL | Status: AC
  Filled 2018-09-25: qty 1

## 2018-09-25 MED ORDER — ACETAMINOPHEN 325 MG PO TABS
650.0000 mg | ORAL_TABLET | Freq: Once | ORAL | Status: AC
  Administered 2018-09-25: 650 mg via ORAL

## 2018-09-25 MED ORDER — DEXAMETHASONE SODIUM PHOSPHATE 10 MG/ML IJ SOLN
INTRAMUSCULAR | Status: AC
  Filled 2018-09-25: qty 1

## 2018-09-25 MED ORDER — PROCHLORPERAZINE MALEATE 10 MG PO TABS
10.0000 mg | ORAL_TABLET | Freq: Once | ORAL | Status: AC
  Administered 2018-09-25: 10 mg via ORAL

## 2018-09-25 MED ORDER — SODIUM CHLORIDE 0.9% FLUSH
10.0000 mL | INTRAVENOUS | Status: DC | PRN
  Administered 2018-09-25: 10 mL
  Filled 2018-09-25: qty 10

## 2018-09-25 MED ORDER — PANTOPRAZOLE SODIUM 40 MG PO TBEC: 40.0000 mg | DELAYED_RELEASE_TABLET | Freq: Every day | ORAL | 2 refills | Status: DC

## 2018-09-25 MED ORDER — SODIUM CHLORIDE 0.9% FLUSH
10.0000 mL | Freq: Once | INTRAVENOUS | Status: AC
  Administered 2018-09-25: 10 mL
  Filled 2018-09-25: qty 10

## 2018-09-25 NOTE — Progress Notes (Signed)
[place] OFFICE PROGRESS NOTE  Patient Care Team: [name], [name], MD as PCP - General (Internal Medicine) [name], MD as Referring Physician (Hematology and Oncology) [name], MD as Consulting Physician (Internal Medicine) [name], [name], MD as Consulting Physician (Infectious Diseases) [name], MD as Consulting Physician (Hematology and Oncology) [name], NP as Nurse Practitioner (Hematology and Oncology)  ASSESSMENT & PLAN:  Multiple myeloma [facility]) I have reviewed documentation at [facility] The patient has achieved very good partial remission She will continue daratumumab along with pomalidomide She has completely tapered off dexamethasone She will continue acyclovir for antimicrobial prophylaxis She will continue aspirin for DVT prophylaxis She will continue calcium with vitamin D and IV bisphosphonate every 3 months; we will hold Zometa in anticipation for her scheduled dental visit in 2 weeks.  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  We discussed precaution to take to avoid unnecessary exposure.  Physical deconditioning Overall, she is improving I encouraged her to continue exercise as tolerated  Vitamin B12 deficiency without anemia She has macrocytosis, likely related to her treatment bone marrow transplant I will check serum vitamin B12 in case she had vitamin B12 deficiency  Peripheral neuropathy due to chemotherapy [facility]) She still has some mild exacerbation of peripheral neuropathy recently, could be related to physical activity It is not severe enough to warrant treatment right now   Orders Placed This Encounter  Procedures  . Vitamin B12    Standing Status:   Future    Number of Occurrences:   1    Standing Expiration Date:   10/30/2019    INTERVAL HISTORY: Please see below for problem oriented charting. She returns for further follow-up She has mild  exacerbation of neuropathy recently She is attempting to walk 2 miles She has dental visit scheduled for 2 weeks She does not have dental pain No new bone pain No recent infection, fever or chills She has eye appointment pending for watery eyes  SUMMARY OF ONCOLOGIC HISTORY: Oncology History   Multiple myeloma, Ig A Lambda, M spike 3.54 grams, Calcium 9.2, Creatinine 0.8, Beta 2 microglobulin 4.52, IgA 4840 mg/dL, lambda light chain 75.4, albumin 3.6, hemoglobin 9.7, platelet 115    Primary site: Multiple Myeloma   Staging method: AJCC 6th Edition   Clinical: Stage IIA signed by [name], MD on 11/07/2013  2:46 PM   Summary: Stage IIA        Multiple myeloma ([place])   10/31/2013 Bone Marrow Biopsy    Bone marrow biopsy confirmed multiple myeloma with 40% bone marrow involvement. Skeletal survey showed minimal lesions in her score with generalized demineralization    11/10/2013 - 02/13/2014 Chemotherapy    The patient is started on induction chemotherapy with weekly dexamethasone 40 mg by mouth as well as Velcade subcutaneous injection on days 1, 4, 8 and 11. On 11/21/2013, she was started on monthly Zometa.    12/23/2013 Adverse Reaction    The dose of Velcade was reduced due to thrombocytopenia.    01/28/2014 - 04/07/2014 Chemotherapy    Revlimid is added. Treatment was discontinued due to lack of response.    02/24/2014 - 04/07/2014 Chemotherapy    Due to worsening peripheral neuropathy, Velcade injection is changed to once a week. Revlimid was given 21 days on, 7 days off.    04/07/2014 - 04/10/2014 Chemotherapy    Revlimid was discontinued due to lack of response. Chemotherapy was changed back to Velcade injection twice a  week, 2 weeks on 1 week off. Her treatment was switched to to minimum response    04/20/2014 - 06/02/2014 Chemotherapy    chemotherapy is switched to Carfilzomib, Cytoxan and dexamethasone.    04/22/2014 Procedure    she has placement of port for chemotherapy.     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 at [facility]    10/20/2014 - 03/24/2015 Chemotherapy     she received chemotherapy with Kyprolis, Revlimid and dexamethasone    10/22/2014 Procedure    She has port placement    01/19/2015 Tumor Marker    IgA lambda M spike at 0.4 g     01/20/2015 Miscellaneous    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 No cource was found and fever resolved with IV vancomycin and meropenem    09/13/2015 Bone Marrow Biopsy    Bone marrow biopsy showed no increased blasts, 3-4 % plasma cells    03/02/2016 Bone Marrow Biopsy    Bone marrow biopsy at [facility] showed normocellular (30%) bone marrow with trilineage hematopoiesis. No significant increase in blasts. No significant increase in plasma cells.    05/12/2016 Imaging    DEXA scan at [place] showed osteopenia    10/24/2016 Imaging    Skeletal survey at [facility], no new lesions    12/07/2017 Imaging    No focal abnormality noted to suggest myeloma. Exam is stable from prior exam.    03/01/2018 Procedure    Successful 8 French right internal jugular vein power port placement  with its tip at the SVC/RA junction.    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    10/31/2015 Miscellaneous    She has engrafted     REVIEW OF SYSTEMS:   Constitutional: Denies fevers, chills or abnormal weight  loss Eyes: Denies blurriness of vision Ears, nose, mouth, throat, and face: Denies mucositis or sore throat Respiratory: Denies cough, dyspnea or wheezes Cardiovascular: Denies palpitation, chest discomfort or lower extremity swelling Gastrointestinal:  Denies nausea, heartburn or change in bowel habits Skin: Denies abnormal skin rashes Lymphatics: Denies new lymphadenopathy or easy bruising Neurological:Denies numbness, tingling or new weaknesses Behavioral/Psych: Mood is stable, no new changes  All other systems were reviewed with the patient and are negative.  I have reviewed the past medical history, past surgical history, social history and family history with the patient and they are unchanged from previous note.  ALLERGIES:  has No Known Allergies.  MEDICATIONS:  Current Outpatient Medications  Medication Sig Dispense Refill  . acyclovir (ZOVIRAX) 400 MG tablet Take 1 tablet (400 mg total) by  mouth 2 (two) times daily. 180 tablet 11  . calcipotriene (DOVONOX) 0.005 % cream     . calcium carbonate (TUMS - DOSED IN MG ELEMENTAL CALCIUM) 500 MG chewable tablet Chew 1 tablet by mouth daily.    . carvedilol (COREG) 3.125 MG tablet TAKE 1 TABLET (3.125 MG TOTAL) BY MOUTH 2 (TWO) TIMES DAILY WITH A MEAL. 180 tablet 3  . Cholecalciferol (VITAMIN D-1000 MAX ST) 1000 units tablet Take 1,000 Units by mouth daily.    Marland Kitchen DARATUMUMAB IV Inject into the vein every 30 (thirty) days.    Marland Kitchen docusate sodium (COLACE) 100 MG capsule Take 100 mg by mouth daily as needed for mild constipation.     . lidocaine-prilocaine (EMLA) cream Apply to affected area once 30 g 3  . loperamide (IMODIUM) 2 MG capsule Take by mouth as needed for diarrhea or loose stools. As needed only    . mirtazapine (REMERON) 15 MG tablet Take 1 tablet (15 mg total) by mouth at bedtime. 90 tablet 1  . Multiple Vitamin (MULTIVITAMIN WITH MINERALS) TABS tablet Take 1 tablet by mouth daily.    . Multiple Vitamins-Minerals (ICAPS AREDS 2) CAPS Take 1 capsule by mouth 2 (two) times daily.    . ondansetron (ZOFRAN) 8 MG tablet Take 1 tablet (8 mg total) by mouth every 8 (eight) hours as needed (Nausea or vomiting). 30 tablet 1  . pantoprazole (PROTONIX) 40 MG tablet Take 1 tablet (40 mg total) by mouth daily. Take 30-60 minutes before breakfast and dinner 180 tablet 2  . pomalidomide (POMALYST) 2 MG capsule Take 1 capsule (2 mg total) by mouth daily. Take with water on days 1-21. Repeat every 28 days. 21 capsule 0  . prochlorperazine (COMPAZINE) 10 MG tablet Take 1 tablet (10 mg total) by mouth every 6 (six) hours as needed (Nausea or vomiting). 30 tablet 1   No current facility-administered medications for this visit.     PHYSICAL EXAMINATION: ECOG PERFORMANCE STATUS: 1 - Symptomatic but completely ambulatory  Vitals:   09/25/18 0817  BP: (!) 153/75  Pulse: 66  Resp: 18  Temp: 98.8 F (37.1 C)  SpO2: 100%   Filed Weights    09/25/18 0817  Weight: 163 lb 9.6 oz (74.2 kg)    GENERAL:alert, no distress and comfortable SKIN: skin color, texture, turgor are normal, no rashes or significant lesions EYES: normal, Conjunctiva are pink and non-injected, sclera clear OROPHARYNX:no exudate, no erythema and lips, buccal mucosa, and tongue normal  NECK: supple, thyroid normal size, non-tender, without nodularity LYMPH:  no palpable lymphadenopathy in the cervical, axillary or inguinal LUNGS: clear to auscultation and percussion with normal breathing effort HEART: regular rate & rhythm and  no murmurs and no lower extremity edema ABDOMEN:abdomen soft, non-tender and normal bowel sounds Musculoskeletal:no cyanosis of digits and no clubbing  NEURO: alert & oriented x 3 with fluent speech, no focal motor/sensory deficits  LABORATORY DATA:  I have reviewed the data as listed    Component Value Date/Time   NA 140 09/25/2018 0741   NA 142 01/04/2017 0902   K 4.3 09/25/2018 0741   K 3.9 01/04/2017 0902   CL 105 09/25/2018 0741   CO2 26 09/25/2018 0741   CO2 28 01/04/2017 0902   GLUCOSE 97 09/25/2018 0741   GLUCOSE 92 01/04/2017 0902   BUN 15 09/25/2018 0741   BUN 9.5 01/04/2017 0902   CREATININE 0.80 09/25/2018 0741   CREATININE 0.7 01/04/2017 0902   CALCIUM 9.1 09/25/2018 0741   CALCIUM 9.0 01/04/2017 0902   PROT 6.1 (L) 09/25/2018 0741   PROT 6.0 (L) 01/04/2017 0902   PROT 5.8 (L) 01/04/2017 0902   ALBUMIN 3.4 (L) 09/25/2018 0741   ALBUMIN 3.4 (L) 01/04/2017 0902   AST 14 (L) 09/25/2018 0741   AST 21 01/04/2017 0902   ALT 13 09/25/2018 0741   ALT 16 01/04/2017 0902   ALKPHOS 68 09/25/2018 0741   ALKPHOS 101 01/04/2017 0902   BILITOT 0.5 09/25/2018 0741   BILITOT 0.56 01/04/2017 0902   GFRNONAA >60 09/25/2018 0741   GFRAA >60 09/25/2018 0741    No results found for: SPEP, UPEP  Lab Results  Component Value Date   WBC 4.1 09/25/2018   NEUTROABS 1.8 09/25/2018   HGB 13.5 09/25/2018   HCT 41.5  09/25/2018   MCV 108.9 (H) 09/25/2018   PLT 196 09/25/2018      Chemistry      Component Value Date/Time   NA 140 09/25/2018 0741   NA 142 01/04/2017 0902   K 4.3 09/25/2018 0741   K 3.9 01/04/2017 0902   CL 105 09/25/2018 0741   CO2 26 09/25/2018 0741   CO2 28 01/04/2017 0902   BUN 15 09/25/2018 0741   BUN 9.5 01/04/2017 0902   CREATININE 0.80 09/25/2018 0741   CREATININE 0.7 01/04/2017 0902      Component Value Date/Time   CALCIUM 9.1 09/25/2018 0741   CALCIUM 9.0 01/04/2017 0902   ALKPHOS 68 09/25/2018 0741   ALKPHOS 101 01/04/2017 0902   AST 14 (L) 09/25/2018 0741   AST 21 01/04/2017 0902   ALT 13 09/25/2018 0741   ALT 16 01/04/2017 0902   BILITOT 0.5 09/25/2018 0741   BILITOT 0.56 01/04/2017 0902      All questions were answered. The patient knows to call the clinic with any problems, questions or concerns. No barriers to learning was detected.  I spent 15 minutes counseling the patient face to face. The total time spent in the appointment was 20 minutes and more than 50% was on counseling and review of test results  [name], MD 09/25/2018 9:03 AM

## 2018-09-25 NOTE — Telephone Encounter (Signed)
Per 4/1 los - Return for No new orders.

## 2018-09-25 NOTE — Telephone Encounter (Signed)
-----   Message from [name], MD sent at 09/25/2018 10:07 AM EDT ----- Regarding: B12 level She is still in treatment room Let her know B12 level is borderline low I recommend 1000 mcg daily PO I will recheck in 3 months ----- Message ----- From: Interface, Lab In [place] Sent: 09/25/2018   8:28 AM EDT To: [name], MD

## 2018-09-25 NOTE — Assessment & Plan Note (Signed)
She still has some mild exacerbation of peripheral neuropathy recently, could be related to physical activity It is not severe enough to warrant treatment right now

## 2018-09-25 NOTE — Assessment & Plan Note (Signed)
Overall, she is improving I encouraged her to continue exercise as tolerated

## 2018-09-25 NOTE — Assessment & Plan Note (Signed)
The patient is immunocompromised.  We discussed precaution to take to avoid unnecessary exposure.

## 2018-09-25 NOTE — Telephone Encounter (Signed)
Went to infusion room and gave her below message and copy of lab results. She verbalized understanding.

## 2018-09-25 NOTE — Assessment & Plan Note (Signed)
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

## 2018-09-25 NOTE — Patient Instructions (Signed)
[facility] Discharge Instructions for Patients Receiving Chemotherapy  Today you received the following chemotherapy agents Daratumumab(Darzalex)  To help prevent nausea and vomiting after your treatment, we encourage you to take your nausea medication as directed.    If you develop nausea and vomiting that is not controlled by your nausea medication, call the clinic.   BELOW ARE SYMPTOMS THAT SHOULD BE REPORTED IMMEDIATELY:  *FEVER GREATER THAN 100.5 F  *CHILLS WITH OR WITHOUT FEVER  NAUSEA AND VOMITING THAT IS NOT CONTROLLED WITH YOUR NAUSEA MEDICATION  *UNUSUAL SHORTNESS OF BREATH  *UNUSUAL BRUISING OR BLEEDING  TENDERNESS IN MOUTH AND THROAT WITH OR WITHOUT PRESENCE OF ULCERS  *URINARY PROBLEMS  *BOWEL PROBLEMS  UNUSUAL RASH Items with * indicate a potential emergency and should be followed up as soon as possible.  Feel free to call the clinic should you have any questions or concerns. The clinic phone number is [phone].  Please show the CHEMO ALERT CARD at check-in to the Emergency Department and triage nurse.   

## 2018-09-25 NOTE — Assessment & Plan Note (Signed)
I have reviewed documentation at [facility] The patient has achieved very good partial remission She will continue daratumumab along with pomalidomide She has completely tapered off dexamethasone She will continue acyclovir for antimicrobial prophylaxis She will continue aspirin for DVT prophylaxis She will continue calcium with vitamin D and IV bisphosphonate every 3 months; we will hold Zometa in anticipation for her scheduled dental visit in 2 weeks.

## 2018-09-26 LAB — MULTIPLE MYELOMA PANEL, SERUM
Albumin SerPl Elph-Mcnc: 3.5 g/dL (ref 2.9–4.4)
Albumin/Glob SerPl: 1.8 — ABNORMAL HIGH (ref 0.7–1.7)
Alpha 1: 0.2 g/dL (ref 0.0–0.4)
Alpha2 Glob SerPl Elph-Mcnc: 0.8 g/dL (ref 0.4–1.0)
B-Globulin SerPl Elph-Mcnc: 0.6 g/dL — ABNORMAL LOW (ref 0.7–1.3)
Gamma Glob SerPl Elph-Mcnc: 0.4 g/dL (ref 0.4–1.8)
Globulin, Total: 2 g/dL — ABNORMAL LOW (ref 2.2–3.9)
IgA: 175 mg/dL (ref 64–422)
IgG (Immunoglobin G), Serum: 487 mg/dL — ABNORMAL LOW (ref 586–1602)
IgM (Immunoglobulin M), Srm: 68 mg/dL (ref 26–217)
Total Protein ELP: 5.5 g/dL — ABNORMAL LOW (ref 6.0–8.5)

## 2018-09-26 LAB — KAPPA/LAMBDA LIGHT CHAINS
Kappa free light chain: 23.1 mg/L — ABNORMAL HIGH (ref 3.3–19.4)
Kappa, lambda light chain ratio: 1.16 (ref 0.26–1.65)
Lambda free light chains: 20 mg/L (ref 5.7–26.3)

## 2018-09-27 ENCOUNTER — Encounter: Payer: Self-pay | Admitting: Hematology and Oncology

## 2018-09-30 ENCOUNTER — Encounter: Payer: Self-pay | Admitting: Hematology and Oncology

## 2018-09-30 ENCOUNTER — Telehealth: Payer: Self-pay | Admitting: *Deleted

## 2018-09-30 NOTE — Telephone Encounter (Signed)
Telephone call to patient to discuss lab results. Patient reports low grade fever this weekend following her treatment last week. She reports no other symptoms or concerns. She will continue to monitor temp and symptoms and call this office if she becomes symptomatic/fever increases. Highest temp over the weekend was 99.9.

## 2018-09-30 NOTE — Telephone Encounter (Signed)
-----   Message from [name], MD sent at 09/27/2018  8:01 AM EDT ----- Regarding: myeloma panel looks good Let her know result looks good I will give her a copy in her next visit

## 2018-10-01 ENCOUNTER — Other Ambulatory Visit: Payer: Self-pay

## 2018-10-01 DIAGNOSIS — C9002 Multiple myeloma in relapse: Secondary | ICD-10-CM

## 2018-10-01 MED ORDER — POMALIDOMIDE 2 MG PO CAPS: 2.0000 mg | ORAL_CAPSULE | Freq: Every day | ORAL | 0 refills | Status: DC

## 2018-10-21 ENCOUNTER — Other Ambulatory Visit: Payer: Self-pay

## 2018-10-21 DIAGNOSIS — C9002 Multiple myeloma in relapse: Secondary | ICD-10-CM

## 2018-10-21 MED ORDER — POMALIDOMIDE 2 MG PO CAPS: 2.0000 mg | ORAL_CAPSULE | Freq: Every day | ORAL | 0 refills | Status: DC

## 2018-10-23 ENCOUNTER — Other Ambulatory Visit: Payer: Self-pay

## 2018-10-23 ENCOUNTER — Inpatient Hospital Stay: Payer: Medicare HMO | Admitting: Hematology and Oncology

## 2018-10-23 ENCOUNTER — Encounter: Payer: Self-pay | Admitting: Hematology and Oncology

## 2018-10-23 ENCOUNTER — Inpatient Hospital Stay: Payer: Medicare HMO

## 2018-10-23 VITALS — BP 128/74 | HR 60 | Temp 98.4°F | Resp 16

## 2018-10-23 DIAGNOSIS — R5381 Other malaise: Secondary | ICD-10-CM

## 2018-10-23 DIAGNOSIS — Z7982 Long term (current) use of aspirin: Secondary | ICD-10-CM

## 2018-10-23 DIAGNOSIS — C9002 Multiple myeloma in relapse: Secondary | ICD-10-CM | POA: Diagnosis not present

## 2018-10-23 DIAGNOSIS — Z5112 Encounter for antineoplastic immunotherapy: Secondary | ICD-10-CM | POA: Diagnosis not present

## 2018-10-23 DIAGNOSIS — Z9484 Stem cells transplant status: Secondary | ICD-10-CM

## 2018-10-23 DIAGNOSIS — G620 Drug-induced polyneuropathy: Secondary | ICD-10-CM | POA: Diagnosis not present

## 2018-10-23 DIAGNOSIS — M8580 Other specified disorders of bone density and structure, unspecified site: Secondary | ICD-10-CM | POA: Diagnosis not present

## 2018-10-23 DIAGNOSIS — Z79899 Other long term (current) drug therapy: Secondary | ICD-10-CM

## 2018-10-23 DIAGNOSIS — R5383 Other fatigue: Secondary | ICD-10-CM | POA: Diagnosis not present

## 2018-10-23 DIAGNOSIS — E538 Deficiency of other specified B group vitamins: Secondary | ICD-10-CM

## 2018-10-23 DIAGNOSIS — D801 Nonfamilial hypogammaglobulinemia: Secondary | ICD-10-CM | POA: Diagnosis not present

## 2018-10-23 DIAGNOSIS — D469 Myelodysplastic syndrome, unspecified: Secondary | ICD-10-CM

## 2018-10-23 DIAGNOSIS — Z9221 Personal history of antineoplastic chemotherapy: Secondary | ICD-10-CM | POA: Diagnosis not present

## 2018-10-23 DIAGNOSIS — T451X5S Adverse effect of antineoplastic and immunosuppressive drugs, sequela: Secondary | ICD-10-CM

## 2018-10-23 LAB — CMP (CANCER CENTER ONLY)
ALT: 9 U/L (ref 0–44)
AST: 11 U/L — ABNORMAL LOW (ref 15–41)
Albumin: 3.5 g/dL (ref 3.5–5.0)
Alkaline Phosphatase: 74 U/L (ref 38–126)
Anion gap: 8 (ref 5–15)
BUN: 11 mg/dL (ref 8–23)
CO2: 24 mmol/L (ref 22–32)
Calcium: 8.4 mg/dL — ABNORMAL LOW (ref 8.9–10.3)
Chloride: 107 mmol/L (ref 98–111)
Creatinine: 0.77 mg/dL (ref 0.44–1.00)
GFR, Est AFR Am: >60 mL/min (ref >60)
GFR, Estimated: >60 mL/min (ref >60)
Glucose, Bld: 101 mg/dL — ABNORMAL HIGH (ref 70–99)
Potassium: 3.9 mmol/L (ref 3.5–5.1)
Sodium: 139 mmol/L (ref 135–145)
Total Bilirubin: 0.3 mg/dL (ref 0.3–1.2)
Total Protein: 6.1 g/dL — ABNORMAL LOW (ref 6.5–8.1)

## 2018-10-23 LAB — CBC WITH DIFFERENTIAL (CANCER CENTER ONLY)
Abs Immature Granulocytes: 0.01 10*3/uL (ref 0.00–0.07)
Basophils Absolute: 0 10*3/uL (ref 0.0–0.1)
Basophils Relative: 1 %
Eosinophils Absolute: 0 10*3/uL (ref 0.0–0.5)
Eosinophils Relative: 0 %
HCT: 39.3 % (ref 36.0–46.0)
Hemoglobin: 12.9 g/dL (ref 12.0–15.0)
Immature Granulocytes: 0 %
Lymphocytes Relative: 46 %
Lymphs Abs: 1.8 10*3/uL (ref 0.7–4.0)
MCH: 35.5 pg — ABNORMAL HIGH (ref 26.0–34.0)
MCHC: 32.8 g/dL (ref 30.0–36.0)
MCV: 108.3 fL — ABNORMAL HIGH (ref 80.0–100.0)
Monocytes Absolute: 0.4 10*3/uL (ref 0.1–1.0)
Monocytes Relative: 10 %
Neutro Abs: 1.7 10*3/uL (ref 1.7–7.7)
Neutrophils Relative %: 43 %
Platelet Count: 201 10*3/uL (ref 150–400)
RBC: 3.63 MIL/uL — ABNORMAL LOW (ref 3.87–5.11)
RDW: 13.2 % (ref 11.5–15.5)
WBC Count: 4 10*3/uL (ref 4.0–10.5)
nRBC: 0 % (ref 0.0–0.2)

## 2018-10-23 MED ORDER — ACETAMINOPHEN 325 MG PO TABS
ORAL_TABLET | ORAL | Status: AC
  Filled 2018-10-23: qty 2

## 2018-10-23 MED ORDER — DEXAMETHASONE SODIUM PHOSPHATE 10 MG/ML IJ SOLN
INTRAMUSCULAR | Status: AC
  Filled 2018-10-23: qty 1

## 2018-10-23 MED ORDER — SODIUM CHLORIDE 0.9 % IV SOLN
16.0000 mg/kg | Freq: Once | INTRAVENOUS | Status: AC
  Administered 2018-10-23: 1200 mg via INTRAVENOUS
  Filled 2018-10-23: qty 60

## 2018-10-23 MED ORDER — DIPHENHYDRAMINE HCL 25 MG PO CAPS
ORAL_CAPSULE | ORAL | Status: AC
  Filled 2018-10-23: qty 2

## 2018-10-23 MED ORDER — PROCHLORPERAZINE MALEATE 10 MG PO TABS
ORAL_TABLET | ORAL | Status: AC
  Filled 2018-10-23: qty 1

## 2018-10-23 MED ORDER — SODIUM CHLORIDE 0.9 % IV SOLN
Freq: Once | INTRAVENOUS | Status: AC
  Administered 2018-10-23: 09:00:00 via INTRAVENOUS
  Filled 2018-10-23: qty 250

## 2018-10-23 MED ORDER — DIPHENHYDRAMINE HCL 25 MG PO CAPS
50.0000 mg | ORAL_CAPSULE | Freq: Once | ORAL | Status: AC
  Administered 2018-10-23: 50 mg via ORAL

## 2018-10-23 MED ORDER — DEXAMETHASONE SODIUM PHOSPHATE 10 MG/ML IJ SOLN
10.0000 mg | Freq: Once | INTRAMUSCULAR | Status: AC
  Administered 2018-10-23: 10 mg via INTRAVENOUS

## 2018-10-23 MED ORDER — PROCHLORPERAZINE MALEATE 10 MG PO TABS
10.0000 mg | ORAL_TABLET | Freq: Once | ORAL | Status: AC
  Administered 2018-10-23: 10 mg via ORAL

## 2018-10-23 MED ORDER — SODIUM CHLORIDE 0.9% FLUSH
10.0000 mL | Freq: Once | INTRAVENOUS | Status: AC
  Administered 2018-10-23: 10 mL
  Filled 2018-10-23: qty 10

## 2018-10-23 MED ORDER — HEPARIN SOD (PORK) LOCK FLUSH 100 UNIT/ML IV SOLN
500.0000 [IU] | Freq: Once | INTRAVENOUS | Status: AC | PRN
  Administered 2018-10-23: 500 [IU]
  Filled 2018-10-23: qty 5

## 2018-10-23 MED ORDER — SODIUM CHLORIDE 0.9% FLUSH
10.0000 mL | INTRAVENOUS | Status: DC | PRN
  Administered 2018-10-23: 10 mL
  Filled 2018-10-23: qty 10

## 2018-10-23 MED ORDER — ACETAMINOPHEN 325 MG PO TABS
650.0000 mg | ORAL_TABLET | Freq: Once | ORAL | Status: AC
  Administered 2018-10-23: 650 mg via ORAL

## 2018-10-23 NOTE — Assessment & Plan Note (Signed)
She has physical deconditioning We talked about graduated exercise as tolerated

## 2018-10-23 NOTE — Progress Notes (Signed)
Per Dr. Alvy Bimler, no Zometa today due to no dental clearance. Kennith Center, Pharm.D., CPP 10/23/2018@9 :26 AM

## 2018-10-23 NOTE — Progress Notes (Signed)
[facility] OFFICE PROGRESS NOTE  Patient Care Team: [name], [name], MD as PCP - General (Internal Medicine) [name], MD as Referring Physician (Hematology and Oncology) [name], MD as Consulting Physician (Internal Medicine) [name], [name], MD as Consulting Physician (Infectious Diseases) [name], MD as Consulting Physician (Hematology and Oncology) [name], NP as Nurse Practitioner (Hematology and Oncology)  ASSESSMENT & PLAN:  Multiple myeloma [facility]) I have reviewed documentation at [facility] The patient has achieved very good partial remission She will continue daratumumab along with pomalidomide She has completely tapered off dexamethasone She will continue acyclovir for antimicrobial prophylaxis She will continue aspirin for DVT prophylaxis She will continue calcium with vitamin D and IV bisphosphonate every 3 months; due to inability to get dental clearance, we will hold off Zometa for now  Vitamin B12 deficiency without anemia She was recently found to have borderline low B12 level She will continue oral vitamin B12 supplement for now and I plan to recheck it next month  Physical deconditioning She has physical deconditioning We talked about graduated exercise as tolerated   No orders of the defined types were placed in this encounter.   INTERVAL HISTORY: Please see below for problem oriented charting. She returns for further follow-up She has significant fatigue with minimal activity She is dependent on Remeron to help with her sleep sometimes She denies worsening peripheral neuropathy She was not able to get dental clearance due to virus pandemic She denies recent tooth pain No recent infection, fever or chills  SUMMARY OF ONCOLOGIC HISTORY: Oncology History   Multiple myeloma, Ig A Lambda, M spike 3.54 grams, Calcium 9.2, Creatinine 0.8, Beta 2 microglobulin 4.52, IgA 4840 mg/dL, lambda  light chain 75.4, albumin 3.6, hemoglobin 9.7, platelet 115    Primary site: Multiple Myeloma   Staging method: AJCC 6th Edition   Clinical: Stage IIA signed by [name], MD on 11/07/2013  2:46 PM   Summary: Stage IIA        Multiple myeloma ([place])   10/31/2013 Bone Marrow Biopsy    Bone marrow biopsy confirmed multiple myeloma with 40% bone marrow involvement. Skeletal survey showed minimal lesions in her score with generalized demineralization    11/10/2013 - 02/13/2014 Chemotherapy    The patient is started on induction chemotherapy with weekly dexamethasone 40 mg by mouth as well as Velcade subcutaneous injection on days 1, 4, 8 and 11. On 11/21/2013, she was started on monthly Zometa.    12/23/2013 Adverse Reaction    The dose of Velcade was reduced due to thrombocytopenia.    01/28/2014 - 04/07/2014 Chemotherapy    Revlimid is added. Treatment was discontinued due to lack of response.    02/24/2014 - 04/07/2014 Chemotherapy    Due to worsening peripheral neuropathy, Velcade injection is changed to once a week. Revlimid was given 21 days on, 7 days off.    04/07/2014 - 04/10/2014 Chemotherapy    Revlimid was discontinued due to lack of response. Chemotherapy was changed back to Velcade injection twice a week, 2 weeks on 1 week off. Her treatment was switched to to minimum response    04/20/2014 - 06/02/2014 Chemotherapy    chemotherapy is switched to Carfilzomib, Cytoxan and dexamethasone.    04/22/2014 Procedure    she has placement of port for chemotherapy.    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 [facility]    10/20/2014 - 03/24/2015 Chemotherapy     she received chemotherapy with Kyprolis, Revlimid and dexamethasone    10/22/2014 Procedure    She has port placement    01/19/2015 Tumor Marker    IgA lambda M spike at 0.4 g     01/20/2015 Miscellaneous    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 No cource was found and fever resolved with IV vancomycin and meropenem    09/13/2015 Bone Marrow Biopsy    Bone marrow biopsy showed no increased blasts, 3-4 % plasma cells    03/02/2016 Bone Marrow Biopsy    Bone marrow biopsy at [facility] showed normocellular (30%) bone marrow with trilineage hematopoiesis. No significant increase in blasts. No significant increase in plasma cells.    05/12/2016 Imaging    DEXA scan at [place] showed osteopenia    10/24/2016 Imaging    Skeletal survey at [facility], no new lesions    12/07/2017 Imaging    No focal abnormality noted to suggest myeloma. Exam is stable from prior exam.    03/01/2018 Procedure    Successful 8 French right internal jugular vein power port placement with its tip at the SVC/RA junction.    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    10/31/2015 Miscellaneous    She has engrafted     REVIEW OF SYSTEMS:   Constitutional: Denies fevers, chills or abnormal weight loss Eyes: Denies blurriness of vision Ears, nose, mouth, throat, and face: Denies mucositis or sore throat Respiratory: Denies cough, dyspnea or wheezes Cardiovascular: Denies palpitation, chest discomfort or lower extremity swelling Gastrointestinal:  Denies nausea, heartburn or change in bowel habits Skin: Denies abnormal skin rashes Lymphatics: Denies new lymphadenopathy or easy bruising Neurological:Denies numbness, tingling or new weaknesses Behavioral/Psych: Mood is stable, no new changes  All other systems were reviewed with the patient  and are negative.  I have reviewed the past medical history, past surgical history, social history and family history with the patient and they are unchanged from previous note.  ALLERGIES:  has No Known Allergies.  MEDICATIONS:  Current Outpatient Medications  Medication Sig Dispense Refill  . aspirin 81 MG chewable tablet Chew 81 mg by mouth daily.    . cyanocobalamin 1000 MCG tablet Take 1,000 mcg by mouth daily.    Marland Kitchen acyclovir (ZOVIRAX) 400 MG tablet Take 1 tablet (400 mg total) by mouth 2 (two) times daily. 180 tablet 11  . calcipotriene (DOVONOX) 0.005 % cream     . calcium carbonate (TUMS - DOSED IN MG ELEMENTAL CALCIUM) 500 MG chewable tablet Chew 1 tablet by mouth daily.    . carvedilol (COREG) 3.125 MG tablet TAKE 1 TABLET (3.125 MG TOTAL) BY MOUTH 2 (TWO) TIMES DAILY WITH A  MEAL. 180 tablet 3  . Cholecalciferol (VITAMIN D-1000 MAX ST) 1000 units tablet Take 1,000 Units by mouth daily.    Marland Kitchen DARATUMUMAB IV Inject into the vein every 30 (thirty) days.    Marland Kitchen docusate sodium (COLACE) 100 MG capsule Take 100 mg by mouth daily as needed for mild constipation.     . lidocaine-prilocaine (EMLA) cream Apply to affected area once 30 g 3  . loperamide (IMODIUM) 2 MG capsule Take by mouth as needed for diarrhea or loose stools. As needed only    . mirtazapine (REMERON) 15 MG tablet Take 1 tablet (15 mg total) by mouth at bedtime. 90 tablet 1  . Multiple Vitamin (MULTIVITAMIN WITH MINERALS) TABS tablet Take 1 tablet by mouth daily.    . Multiple Vitamins-Minerals (ICAPS AREDS 2) CAPS Take 1 capsule by mouth 2 (two) times daily.    . ondansetron (ZOFRAN) 8 MG tablet Take 1 tablet (8 mg total) by mouth every 8 (eight) hours as needed (Nausea or vomiting). 30 tablet 1  . pantoprazole (PROTONIX) 40 MG tablet Take 1 tablet (40 mg total) by mouth daily. Take 30-60 minutes before breakfast and dinner 180 tablet 2  . pomalidomide (POMALYST) 2 MG capsule Take 1 capsule (2 mg total) by mouth daily. Take with water on days 1-21. Repeat every 28 days. 21 capsule 0  . prochlorperazine (COMPAZINE) 10 MG tablet Take 1 tablet (10 mg total) by mouth every 6 (six) hours as needed (Nausea or vomiting). 30 tablet 1   No current facility-administered medications for this visit.    Facility-Administered Medications Ordered in Other Visits  Medication Dose Route Frequency Provider Last Rate Last Dose  . daratumumab (DARZALEX) 1,200 mg in sodium chloride 0.9 % 440 mL chemo infusion  16 mg/kg (Treatment Plan Recorded) Intravenous Once Alvy Bimler, Fahima Cifelli, MD      . heparin lock flush 100 unit/mL  500 Units Intracatheter Once PRN Alvy Bimler, Tabytha Gradillas, MD      . sodium chloride flush (NS) 0.9 % injection 10 mL  10 mL Intracatheter PRN Alvy Bimler, Khyrie Masi, MD        PHYSICAL EXAMINATION: ECOG PERFORMANCE STATUS: 1 - Symptomatic  but completely ambulatory  Vitals:   10/23/18 0829  BP: (!) 141/86  Pulse: 71  Resp: 16  Temp: 98.3 F (36.8 C)  SpO2: 100%   Filed Weights   10/23/18 0829  Weight: 164 lb 9.6 oz (74.7 kg)    GENERAL:alert, no distress and comfortable SKIN: skin color, texture, turgor are normal, no rashes or significant lesions EYES: normal, Conjunctiva are pink and non-injected, sclera clear OROPHARYNX:no exudate, no  erythema and lips, buccal mucosa, and tongue normal  NECK: supple, thyroid normal size, non-tender, without nodularity LYMPH:  no palpable lymphadenopathy in the cervical, axillary or inguinal LUNGS: clear to auscultation and percussion with normal breathing effort HEART: regular rate & rhythm and no murmurs and no lower extremity edema ABDOMEN:abdomen soft, non-tender and normal bowel sounds Musculoskeletal:no cyanosis of digits and no clubbing  NEURO: alert & oriented x 3 with fluent speech, no focal motor/sensory deficits  LABORATORY DATA:  I have reviewed the data as listed    Component Value Date/Time   NA 139 10/23/2018 0815   NA 142 01/04/2017 0902   K 3.9 10/23/2018 0815   K 3.9 01/04/2017 0902   CL 107 10/23/2018 0815   CO2 24 10/23/2018 0815   CO2 28 01/04/2017 0902   GLUCOSE 101 (H) 10/23/2018 0815   GLUCOSE 92 01/04/2017 0902   BUN 11 10/23/2018 0815   BUN 9.5 01/04/2017 0902   CREATININE 0.77 10/23/2018 0815   CREATININE 0.7 01/04/2017 0902   CALCIUM 8.4 (L) 10/23/2018 0815   CALCIUM 9.0 01/04/2017 0902   PROT 6.1 (L) 10/23/2018 0815   PROT 6.0 (L) 01/04/2017 0902   PROT 5.8 (L) 01/04/2017 0902   ALBUMIN 3.5 10/23/2018 0815   ALBUMIN 3.4 (L) 01/04/2017 0902   AST 11 (L) 10/23/2018 0815   AST 21 01/04/2017 0902   ALT 9 10/23/2018 0815   ALT 16 01/04/2017 0902   ALKPHOS 74 10/23/2018 0815   ALKPHOS 101 01/04/2017 0902   BILITOT 0.3 10/23/2018 0815   BILITOT 0.56 01/04/2017 0902   GFRNONAA >60 10/23/2018 0815   GFRAA >60 10/23/2018 0815    No  results found for: SPEP, UPEP  Lab Results  Component Value Date   WBC 4.0 10/23/2018   NEUTROABS 1.7 10/23/2018   HGB 12.9 10/23/2018   HCT 39.3 10/23/2018   MCV 108.3 (H) 10/23/2018   PLT 201 10/23/2018      Chemistry      Component Value Date/Time   NA 139 10/23/2018 0815   NA 142 01/04/2017 0902   K 3.9 10/23/2018 0815   K 3.9 01/04/2017 0902   CL 107 10/23/2018 0815   CO2 24 10/23/2018 0815   CO2 28 01/04/2017 0902   BUN 11 10/23/2018 0815   BUN 9.5 01/04/2017 0902   CREATININE 0.77 10/23/2018 0815   CREATININE 0.7 01/04/2017 0902      Component Value Date/Time   CALCIUM 8.4 (L) 10/23/2018 0815   CALCIUM 9.0 01/04/2017 0902   ALKPHOS 74 10/23/2018 0815   ALKPHOS 101 01/04/2017 0902   AST 11 (L) 10/23/2018 0815   AST 21 01/04/2017 0902   ALT 9 10/23/2018 0815   ALT 16 01/04/2017 0902   BILITOT 0.3 10/23/2018 0815   BILITOT 0.56 01/04/2017 0902      All questions were answered. The patient knows to call the clinic with any problems, questions or concerns. No barriers to learning was detected.  I spent 15 minutes counseling the patient face to face. The total time spent in the appointment was 20 minutes and more than 50% was on counseling and review of test results  [name], MD 10/23/2018 10:01 AM

## 2018-10-23 NOTE — Assessment & Plan Note (Signed)
She was recently found to have borderline low B12 level She will continue oral vitamin B12 supplement for now and I plan to recheck it next month

## 2018-10-23 NOTE — Assessment & Plan Note (Addendum)
I have reviewed documentation at [facility] The patient has achieved very good partial remission She will continue daratumumab along with pomalidomide She has completely tapered off dexamethasone She will continue acyclovir for antimicrobial prophylaxis She will continue aspirin for DVT prophylaxis She will continue calcium with vitamin D and IV bisphosphonate every 3 months; due to inability to get dental clearance, we will hold off Zometa for now

## 2018-10-23 NOTE — Patient Instructions (Addendum)
[facility] Discharge Instructions for Patients Receiving Chemotherapy  Today you received the following chemotherapy agents:  daratumumab (Darzalex)  To help prevent nausea and vomiting after your treatment, we encourage you to take your nausea medication as prescribed.   If you develop nausea and vomiting that is not controlled by your nausea medication, call the clinic.   BELOW ARE SYMPTOMS THAT SHOULD BE REPORTED IMMEDIATELY:  *FEVER GREATER THAN 100.5 F  *CHILLS WITH OR WITHOUT FEVER  NAUSEA AND VOMITING THAT IS NOT CONTROLLED WITH YOUR NAUSEA MEDICATION  *UNUSUAL SHORTNESS OF BREATH  *UNUSUAL BRUISING OR BLEEDING  TENDERNESS IN MOUTH AND THROAT WITH OR WITHOUT PRESENCE OF ULCERS  *URINARY PROBLEMS  *BOWEL PROBLEMS  UNUSUAL RASH Items with * indicate a potential emergency and should be followed up as soon as possible.  Feel free to call the clinic should you have any questions or concerns. The clinic phone number is [phone].  Please show the [place] at check-in to the Emergency Department and triage nurse.   Coronavirus (COVID-19) Are you at risk?  Are you at risk for the Coronavirus (COVID-19)?  To be considered HIGH RISK for Coronavirus (COVID-19), you have to meet the following criteria:  . Traveled to [place], [place], [place], [place] or [place]; or in the [place] to [place], [place], [place], or [place]; and have fever, cough, and shortness of breath within the last 2 weeks of travel OR . Been in close contact with a person diagnosed with COVID-19 within the last 2 weeks and have fever, cough, and shortness of breath . IF YOU DO NOT MEET THESE CRITERIA, YOU ARE CONSIDERED LOW RISK FOR COVID-19.  What to do if you are HIGH RISK for COVID-19?  Marland Kitchen If you are having a medical emergency, call 911. . Seek medical care right away. Before you go to a doctor's office, urgent care or emergency department, call ahead and tell  them about your recent travel, contact with someone diagnosed with COVID-19, and your symptoms. You should receive instructions from your physician's office regarding next steps of care.  . When you arrive at healthcare provider, tell the healthcare staff immediately you have returned from visiting [place], [place], [place], [place] or [place]; or traveled in the [place] to [place], [place], [place], or [place]; in the last two weeks or you have been in close contact with a person diagnosed with COVID-19 in the last 2 weeks.   . Tell the health care staff about your symptoms: fever, cough and shortness of breath. . After you have been seen by a medical provider, you will be either: o Tested for (COVID-19) and discharged home on quarantine except to seek medical care if symptoms worsen, and asked to  - Stay home and avoid contact with others until you get your results (4-5 days)  - Avoid travel on public transportation if possible (such as bus, train, or airplane) or o Sent to the Emergency Department by EMS for evaluation, COVID-19 testing, and possible admission depending on your condition and test results.  What to do if you are LOW RISK for COVID-19?  Reduce your risk of any infection by using the same precautions used for avoiding the common cold or flu:  Marland Kitchen Wash your hands often with soap and warm water for at least 20 seconds.  If soap and water are not readily available, use an alcohol-based hand sanitizer with at least 60% alcohol.  . If  coughing or sneezing, cover your mouth and nose by coughing or sneezing into the elbow areas of your shirt or coat, into a tissue or into your sleeve (not your hands). . Avoid shaking hands with others and consider head nods or verbal greetings only. . Avoid touching your eyes, nose, or mouth with unwashed hands.  . Avoid close contact with people who are sick. . Avoid places or events with large numbers of people in one location, like concerts or  sporting events. . Carefully consider travel plans you have or are making. . If you are planning any travel outside or inside the [place], visit the CDC's Travelers' Health webpage for the latest health notices. . If you have some symptoms but not all symptoms, continue to monitor at home and seek medical attention if your symptoms worsen. . If you are having a medical emergency, call 911.   ADDITIONAL HEALTHCARE OPTIONS FOR PATIENTS  [facility] / e-Visit: [email or web address]         [facility]: [phone]  [facility]: [phone]                   [facility]: [phone]   

## 2018-10-24 ENCOUNTER — Telehealth: Payer: Self-pay | Admitting: Hematology and Oncology

## 2018-10-24 LAB — MULTIPLE MYELOMA PANEL, SERUM
Albumin SerPl Elph-Mcnc: 3.6 g/dL (ref 2.9–4.4)
Albumin/Glob SerPl: 1.7 (ref 0.7–1.7)
Alpha 1: 0.2 g/dL (ref 0.0–0.4)
Alpha2 Glob SerPl Elph-Mcnc: 0.8 g/dL (ref 0.4–1.0)
B-Globulin SerPl Elph-Mcnc: 0.7 g/dL (ref 0.7–1.3)
Gamma Glob SerPl Elph-Mcnc: 0.5 g/dL (ref 0.4–1.8)
Globulin, Total: 2.2 g/dL (ref 2.2–3.9)
IgA: 194 mg/dL (ref 64–422)
IgG (Immunoglobin G), Serum: 503 mg/dL — ABNORMAL LOW (ref 586–1602)
IgM (Immunoglobulin M), Srm: 66 mg/dL (ref 26–217)
Total Protein ELP: 5.8 g/dL — ABNORMAL LOW (ref 6.0–8.5)

## 2018-10-24 LAB — KAPPA/LAMBDA LIGHT CHAINS
Kappa free light chain: 21.9 mg/L — ABNORMAL HIGH (ref 3.3–19.4)
Kappa, lambda light chain ratio: 1.15 (ref 0.26–1.65)
Lambda free light chains: 19 mg/L (ref 5.7–26.3)

## 2018-10-24 NOTE — Telephone Encounter (Signed)
Tried to reach °

## 2018-10-25 ENCOUNTER — Telehealth: Payer: Self-pay

## 2018-10-25 NOTE — Telephone Encounter (Signed)
-----   Message from [name], MD sent at 10/25/2018  8:29 AM EDT ----- Regarding: call her when able for test results; all stable  ----- Message ----- From: Interface, [facility] In [place] Sent: 10/23/2018   8:41 AM EDT To: [name], MD

## 2018-10-25 NOTE — Telephone Encounter (Signed)
Called and left below message. Ask her to call the office if she has questions.

## 2018-10-31 ENCOUNTER — Encounter: Payer: Self-pay | Admitting: General Practice

## 2018-10-31 NOTE — Progress Notes (Signed)
[facility] Spiritual Care Note  Follow Jean Davidson through [facility]. Mailing handwritten note of encouragement with [employer] online programming information as a check-in while groups are suspended during covid-19.   [place], [place], [facility] Pager [phone] Voicemail [phone]

## 2018-11-01 ENCOUNTER — Telehealth: Payer: Self-pay | Admitting: Cardiovascular Disease

## 2018-11-01 MED ORDER — CARVEDILOL 6.25 MG PO TABS: 6.2500 mg | ORAL_TABLET | Freq: Two times a day (BID) | ORAL | 1 refills | Status: DC

## 2018-11-01 NOTE — Telephone Encounter (Signed)
Patient calling to check on status.

## 2018-11-01 NOTE — Telephone Encounter (Signed)
Call to patient to discuss advise from provider.   She verbalized understanding and had no further questions at this time.   Advised pt to call for any further questions or concerns.   Confirmed e visit.   Pt gave verbal consent for e visit. Reviewed e visit procedure.

## 2018-11-01 NOTE — Telephone Encounter (Signed)
Returned call to patient.   She is having HTN 197/106, HR 91 today at noon.  Other Bps taken at home 123/97, HR  160/83 HR 83 at 1:30 PM today.   She reports that she went to chemo on 4/29. When she was checking in RN took BP and asked her if she had hx of a fib. RN took again after infusion and reported that HR was doing better. Per Epic BP 128/74, HR 60  Other than being nervous at baseline, especially with CV19 and being isolated from family), she is doing well. No chest, no SOB.   She does take carvedilol, she is wondering if dose should be increase.  She is due for 1 year follow up. Made evisit with Christell Faith, PA on 5/12 at 9 am.   Routed to provider to further advise.

## 2018-11-01 NOTE — Telephone Encounter (Signed)
BP's appear to be variable.  Reasonable to increase carvedilol to 6.25mg  BID.  As for ? Of irregular HR or afib, an ECG would be helpful. Agree w/ evisit w/ Ryan on 5/12 and determination can be made at that point if she needs to be seen in person.

## 2018-11-01 NOTE — Telephone Encounter (Signed)
New Message   Pt c/o BP issue:  1. What are your last 5 BP readings? 197/106,  2. Are you having any other symptoms (ex. Dizziness, headache, blurred vision, passed out)? Feels nervous. Some shortness of breath 3. What is your medication issue? no   Patient states that when she went to chemo a few days ago the nurse asked her is she in afib and she said no. The nurse told her that it seemed like it but when they checked her again she was fine.

## 2018-11-04 NOTE — Progress Notes (Signed)
Virtual Visit via Video Note   This visit type was conducted due to national recommendations for restrictions regarding the COVID-19 Pandemic (e.g. social distancing) in an effort to limit this patient's exposure and mitigate transmission in our community.  Due to her co-morbid illnesses, this patient is at least at moderate risk for complications without adequate follow up.  This format is felt to be most appropriate for this patient at this time.  All issues noted in this document were discussed and addressed.  A limited physical exam was performed with this format.  Please refer to the patient's chart for her consent to telehealth for [facility].   Date:  11/05/2018   ID:  Jean Davidson, DOB 05/21/1946, MRN [id number]  Patient Location: Home Provider Location: Home  PCP:  [name], [name], MD  Cardiologist:  Kathlyn Sacramento, MD  Electrophysiologist:  None   Evaluation Performed:  Follow-Up Visit  Chief Complaint:  Follow up BP  History of Present Illness:    Jean Davidson is a 73 y.o. female with multiple myeloma s/p bone marrow transplant in 2016, MDS s/p bone marrow transplant in 06/2018, pericardial effusion, HTN, palpitations, anemia, and cervical spine stenosis who presents for evaluation of fluctuating BP.   She was previously seen by Dr. Fletcher Anon in 2016 for palpitations with Holter monitor showing NSR with one PVC as well as several PACs. Echo was overall normal as was a treadmill stress test. In the setting of hypertension, she was started on Coreg. She has known multiple myeloma that is refractory to treatment with relapsing disease noted in 2016. She is followed by [facility]. At some point, she developed a large pericardial effusion and was seen by [facility] cardiology with initial plan of pericardiocentesis, though the effusion decreased in size without intervention. Echo in 2018 showed a small pericardial effusion. She was last seen in our office in 10/2017 and was doing well. EKG at  that time did not show low voltage.   She called on 11/01/2018 noting oncology nurse questioning if the patient was in Afib with reported recheck indicating no Afib (details are unclear). She also noted variable BP with readings ranging from 197/106 to 123/97. Her Coreg was increased to 6.25 mg bid.  She is doing well from a cardiac perspective today. She indicates she has ben anxious with the COVID-19 pandemic and has been on edge. She notes increased anxiety with with noises in her house or outside. In this setting she has felt lilke her BP has been running high. In talking with her about the above infusion nurse questioning if she was in Afib, the patient indicates she was noting her BP was elevated, not her heart rate. Upon hearing the word Afib, the patient became more anxious leading to continued elevation of her BP. She denies any palpitations, chest pain, SOB, dizziness, presyncope, or syncope. No lower extremity swelling, abdominal fullness, orthopnea, PND, or early satiety. She notes her BP has been running in the 120s/upper 80s since increasing Coreg to 6.25 mg bid. She would like to see her diastolic BP lower.   The patient does not have symptoms concerning for COVID-19 infection (fever, chills, cough, or new shortness of breath).    Past Medical History:  Diagnosis Date  . Anemia   . Anxiety   . Blood transfusion without reported diagnosis   . Carotid stenosis    Mild bilateral  . Carpal tunnel syndrome   . Cervical disc disease    c7  .  Cough 07/27/2015  . Disc degeneration   . Dysuria 07/26/2015  . 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 Fatigue 01/26/2015  . Fever 07/27/2015  . GERD (gastroesophageal reflux disease)   . 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 Hypertension    Borderline.  . Internal and external hemorrhoids without complication   . 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 Multiple myeloma ([place])    . Pinched nerve   . Sinusitis, bacterial 07/27/2015  . Spinal stenosis in cervical region   . Varicose veins of lower extremities with inflammation    Past Surgical History:  Procedure Laterality Date  . BLADDER SUSPENSION  1988/1989   x2  . COLONOSCOPY    . ESOPHAGOGASTRODUODENOSCOPY    . IR IMAGING GUIDED PORT INSERTION  03/01/2018  . PORT-A-CATH REMOVAL    . SHOULDER SURGERY Right 04/06/13   right     Current Meds  Medication Sig  . acyclovir (ZOVIRAX) 400 MG tablet Take 1 tablet (400 mg total) by mouth 2 (two) times daily.  Marland Kitchen aspirin 81 MG chewable tablet Chew 81 mg by mouth daily. Takes 21 days out of the month with chemo drug  . calcipotriene (DOVONOX) 0.005 % cream daily.   . calcium carbonate (TUMS - DOSED IN MG ELEMENTAL CALCIUM) 500 MG chewable tablet Chew 1 tablet by mouth daily.  . carvedilol (COREG) 6.25 MG tablet Take 1 tablet (6.25 mg total) by mouth 2 (two) times daily with a meal.  . Cholecalciferol (VITAMIN D-1000 MAX ST) 1000 units tablet Take 1,000 Units by mouth daily.  . cyanocobalamin 1000 MCG tablet Take 1,000 mcg by mouth daily.  Marland Kitchen DARATUMUMAB IV Inject into the vein every 30 (thirty) days.  Marland Kitchen docusate sodium (COLACE) 100 MG capsule Take 100 mg by mouth daily as needed for mild constipation.   Marland Kitchen loperamide (IMODIUM) 2 MG capsule Take by mouth as needed for diarrhea or loose stools. As needed only  . mirtazapine (REMERON) 15 MG tablet Take 1 tablet (15 mg total) by mouth at bedtime.  . Multiple Vitamin (MULTIVITAMIN WITH MINERALS) TABS tablet Take 1 tablet by mouth daily.  . Multiple Vitamins-Minerals (ICAPS AREDS 2) CAPS Take 1 capsule by mouth 2 (two) times daily.  . ondansetron (ZOFRAN) 8 MG tablet Take 1 tablet (8 mg total) by mouth every 8 (eight) hours as needed (Nausea or vomiting).  . pantoprazole (PROTONIX) 40 MG tablet Take 1 tablet (40 mg total) by mouth daily. Take 30-60 minutes before breakfast and dinner  . pomalidomide (POMALYST) 2 MG capsule  Take 1 capsule (2 mg total) by mouth daily. Take with water on days 1-21. Repeat every 28 days.  . prochlorperazine (COMPAZINE) 10 MG tablet Take 1 tablet (10 mg total) by mouth every 6 (six) hours as needed (Nausea or vomiting).     Allergies:   Patient has no known allergies.   Social History   Tobacco Use  . Smoking status: Former Smoker    Packs/day: 0.50    Years: 4.00    Pack years: 2.00    Last attempt to quit: 06/26/1968    Years since quitting: 50.3  . Smokeless tobacco: Never Used  Substance Use Topics  . Alcohol use: Yes    Alcohol/week: 1.0 standard drinks    Types: 1 Cans of beer per week    Comment: 1 per week per pt  . Drug use: No  Family Hx: The patient's family history includes Colon cancer (age of onset: 89) in her paternal aunt; Diabetes in her father; Heart disease in her brother, father, and mother; Heart failure in her father; Hypertension in her brother; Kidney disease in her father; Skin cancer in her brother and father; Throat cancer in her father. There is no history of Stomach cancer.  ROS:   Please see the history of present illness.     All other systems reviewed and are negative.   Prior CV studies:   The following studies were reviewed today:  2D Echo 06/2016 ([facility]): NORMAL LEFT VENTRICULAR SYSTOLIC FUNCTION WITH MILD LVH NORMAL LA PRESSURES WITH NORMAL DIASTOLIC FUNCTION NORMAL RIGHT VENTRICULAR SYSTOLIC FUNCTION VALVULAR REGURGITATION: TRIVIAL MR, TRIVIAL PR, TRIVIAL TR NO VALVULAR STENOSIS SMALL PERICARDIAL EFFUSION (See above) MILD RA DIASTOLIC COLLAPSE NORMAL IVC WITH NORMAL COLLAPSE  Labs/Other Tests and Data Reviewed:    EKG:  An ECG dated 11/02/2017 was personally reviewed today and demonstrated:  NSR, 82 bpm, possible left atrial enlargement, no significant st/t changes  Recent Labs: 12/20/2017: Magnesium 2.0 10/23/2018: ALT 9; BUN 11; Creatinine 0.77; Hemoglobin 12.9; Platelet Count 201; Potassium 3.9; Sodium 139    Recent Lipid Panel Lab Results  Component Value Date/Time   CHOL 110 08/08/2013 11:27 AM   TRIG 82.0 08/08/2013 11:27 AM   HDL 36.50 (L) 08/08/2013 11:27 AM   CHOLHDL 3 08/08/2013 11:27 AM   LDLCALC 57 08/08/2013 11:27 AM    Wt Readings from Last 3 Encounters:  11/05/18 162 lb (73.5 kg)  10/23/18 164 lb 9.6 oz (74.7 kg)  09/25/18 163 lb 9.6 oz (74.2 kg)     Objective:    Vital Signs:  BP 126/89 (BP Location: Left Arm, Patient Position: Sitting)   Pulse 77   Ht '5\' 2"'  (1.575 m)   Wt 162 lb (73.5 kg)   BMI 29.63 kg/m    VITAL SIGNS:  reviewed GEN:  no acute distress EYES:  sclerae anicteric, EOMI - Extraocular Movements Intact  ASSESSMENT & PLAN:    1. History of pericardial effusion: Appears to be hemodynamically stable. Schedule echo for fall of 2020 to reevaluate her prior pericardial effusion. We are pushing this out to then given her comorbid conditions in the setting of COVID-19.   2. Palpitations: Asymptomatic. CNA with the infusion center questioned if the patient was in Afib on 4/29. We do not have details of this. She does not have any prior history of Afib. In this setting, we will send out a 14-day Zio patch to further evaluate. No indication for [place] at this time without documented Afib. Continue Coreg as below.   3. HTN: Likely exacerbated by increased stress surrounding COIVID-19. Increase Coreg to 12.5 mg bid. She will call in 1 week to update BP.   4. Multiple myeloma/MDS: Followed by [facility].   COVID-19 Education: The signs and symptoms of COVID-19 were discussed with the patient and how to seek care for testing (follow up with PCP or arrange E-visit).  The importance of social distancing was discussed today.  Time:   Today, I have spent 16 minutes with the patient with telehealth technology discussing the above problems.     Medication Adjustments/Labs and Tests Ordered: Current medicines are reviewed at length with the patient today.  Concerns regarding  medicines are outlined above.   Tests Ordered: No orders of the defined types were placed in this encounter.   Medication Changes: No orders of the defined types were placed in  this encounter.   Disposition:  Follow up in 6 week(s)  Signed, Christell Faith, PA-C  11/05/2018 8:58 AM    [place]

## 2018-11-05 ENCOUNTER — Other Ambulatory Visit: Payer: Self-pay

## 2018-11-05 ENCOUNTER — Telehealth (INDEPENDENT_AMBULATORY_CARE_PROVIDER_SITE_OTHER): Payer: Medicare HMO | Admitting: Physician Assistant

## 2018-11-05 VITALS — BP 126/89 | HR 77 | Ht 62.0 in | Wt 162.0 lb

## 2018-11-05 DIAGNOSIS — R002 Palpitations: Secondary | ICD-10-CM

## 2018-11-05 DIAGNOSIS — C9000 Multiple myeloma not having achieved remission: Secondary | ICD-10-CM

## 2018-11-05 DIAGNOSIS — I10 Essential (primary) hypertension: Secondary | ICD-10-CM

## 2018-11-05 DIAGNOSIS — I313 Pericardial effusion (noninflammatory): Secondary | ICD-10-CM | POA: Diagnosis not present

## 2018-11-05 DIAGNOSIS — I3139 Other pericardial effusion (noninflammatory): Secondary | ICD-10-CM

## 2018-11-05 MED ORDER — CARVEDILOL 12.5 MG PO TABS: 12.5000 mg | ORAL_TABLET | Freq: Two times a day (BID) | ORAL | 3 refills | Status: DC

## 2018-11-05 NOTE — Patient Instructions (Addendum)
It was a pleasure to speak with you on the phone today! Thank you for allowing [name] to continue taking care of your [facility] needs during this time.   Feel free to call as needed for questions and concerns related to your cardiac needs.   Medication Instructions:  Your physician has recommended you make the following change in your medication:  1- INCREASE Coreg to 1 tablet (12.5 total) twice daily.  If you need a refill on your cardiac medications before your next appointment, please call your pharmacy.   Lab work: None ordered  If you have labs (blood work) drawn today and your tests are completely normal, you will receive your results only by: Marland Kitchen MyChart Message (if you have MyChart) OR . A paper copy in the mail If you have any lab test that is abnormal or we need to change your treatment, we will call you to review the results.  Testing/Procedures: 1- Zio XT A zio monitor will be mailed to your home.  You may get a call from [facility] [facility] @ either [phone] Or  [phone] for them to confirm your address before it will be sent to you. It will remain on for 14 days. You will then return monitor and event diary in provided box. It takes 1-2 weeks for report to be downloaded and returned to [name]. We will call you with the results. If monitor falls of or has orange flashing light, please call Zio for further instructions.   2- Echo  Please return to [facility] 2020_____ at _______________ AM/PM for an Echocardiogram. Your physician has requested that you have an echocardiogram. Echocardiography is a painless test that uses sound waves to create images of your heart. It provides your doctor with information about the size and shape of your heart and how well your heart's chambers and valves are working. This procedure takes approximately one hour. There are no restrictions for this procedure. Please note; depending on visual quality an IV may need to be placed.    Follow-Up: At [facility], you and your health needs are our priority.  As part of our continuing mission to provide you with exceptional heart care, we have created designated Provider Care Teams.  These Care Teams include your primary Cardiologist (physician) and Advanced Practice Providers (APPs -  Physician Assistants and Nurse Practitioners) who all work together to provide you with the care you need, when you need it. You will need a follow up appointment in 4-6 weeks (after monitor resulted).  You may see Kathlyn Sacramento, MD or Christell Faith, PA-C.   1- Call the clinic in 1 week with BP readings.  How to use a home blood pressure monitor. . Be still. Don't smoke, drink caffeinated beverages or exercise within 30 minutes before measuring your blood pressure. . Sit correctly. Sit with your back straight and supported (on a dining chair, rather than a sofa). Your feet should be flat on the floor and your legs should not be crossed. Your arm should be supported on a flat surface (such as a table) with the upper arm at heart level. Make sure the bottom of the cuff is placed directly above the bend of the elbow.  . Measure at the same time every day. It's important to take the readings at the same time each day, such as morning and evening. Take reading approximately 1 hour after BP medications.  Please call Roneka Gilpin, RN if you have any questions or concerns. [phone]

## 2018-11-13 ENCOUNTER — Ambulatory Visit (INDEPENDENT_AMBULATORY_CARE_PROVIDER_SITE_OTHER): Payer: Medicare HMO

## 2018-11-13 DIAGNOSIS — R002 Palpitations: Secondary | ICD-10-CM | POA: Diagnosis not present

## 2018-11-14 ENCOUNTER — Other Ambulatory Visit: Payer: Self-pay | Admitting: Hematology and Oncology

## 2018-11-19 ENCOUNTER — Other Ambulatory Visit: Payer: Self-pay

## 2018-11-19 DIAGNOSIS — C9002 Multiple myeloma in relapse: Secondary | ICD-10-CM

## 2018-11-19 MED ORDER — POMALIDOMIDE 2 MG PO CAPS: 2.0000 mg | ORAL_CAPSULE | Freq: Every day | ORAL | 0 refills | Status: DC

## 2018-11-20 ENCOUNTER — Inpatient Hospital Stay: Payer: Medicare HMO

## 2018-11-20 ENCOUNTER — Encounter: Payer: Self-pay | Admitting: Hematology and Oncology

## 2018-11-20 ENCOUNTER — Other Ambulatory Visit: Payer: Self-pay | Admitting: Hematology and Oncology

## 2018-11-20 ENCOUNTER — Other Ambulatory Visit: Payer: Self-pay

## 2018-11-20 ENCOUNTER — Inpatient Hospital Stay (HOSPITAL_BASED_OUTPATIENT_CLINIC_OR_DEPARTMENT_OTHER): Payer: Medicare HMO | Admitting: Hematology and Oncology

## 2018-11-20 ENCOUNTER — Inpatient Hospital Stay: Payer: Medicare HMO | Attending: Hematology and Oncology

## 2018-11-20 VITALS — BP 120/79 | HR 62 | Temp 98.3°F | Resp 20

## 2018-11-20 DIAGNOSIS — M8580 Other specified disorders of bone density and structure, unspecified site: Secondary | ICD-10-CM | POA: Diagnosis not present

## 2018-11-20 DIAGNOSIS — C9002 Multiple myeloma in relapse: Secondary | ICD-10-CM

## 2018-11-20 DIAGNOSIS — D61818 Other pancytopenia: Secondary | ICD-10-CM | POA: Diagnosis not present

## 2018-11-20 DIAGNOSIS — E538 Deficiency of other specified B group vitamins: Secondary | ICD-10-CM

## 2018-11-20 DIAGNOSIS — C9000 Multiple myeloma not having achieved remission: Secondary | ICD-10-CM | POA: Diagnosis not present

## 2018-11-20 DIAGNOSIS — D469 Myelodysplastic syndrome, unspecified: Secondary | ICD-10-CM

## 2018-11-20 DIAGNOSIS — Z9221 Personal history of antineoplastic chemotherapy: Secondary | ICD-10-CM | POA: Diagnosis not present

## 2018-11-20 DIAGNOSIS — Z5112 Encounter for antineoplastic immunotherapy: Secondary | ICD-10-CM | POA: Diagnosis not present

## 2018-11-20 DIAGNOSIS — Z79899 Other long term (current) drug therapy: Secondary | ICD-10-CM | POA: Insufficient documentation

## 2018-11-20 DIAGNOSIS — Z7982 Long term (current) use of aspirin: Secondary | ICD-10-CM

## 2018-11-20 LAB — CMP (CANCER CENTER ONLY)
ALT: 11 U/L (ref 0–44)
AST: 13 U/L — ABNORMAL LOW (ref 15–41)
Albumin: 3.3 g/dL — ABNORMAL LOW (ref 3.5–5.0)
Alkaline Phosphatase: 73 U/L (ref 38–126)
Anion gap: 8 (ref 5–15)
BUN: 11 mg/dL (ref 8–23)
CO2: 24 mmol/L (ref 22–32)
Calcium: 8.1 mg/dL — ABNORMAL LOW (ref 8.9–10.3)
Chloride: 108 mmol/L (ref 98–111)
Creatinine: 0.75 mg/dL (ref 0.44–1.00)
GFR, Est AFR Am: >60 mL/min (ref >60)
GFR, Estimated: >60 mL/min (ref >60)
Glucose, Bld: 89 mg/dL (ref 70–99)
Potassium: 4 mmol/L (ref 3.5–5.1)
Sodium: 140 mmol/L (ref 135–145)
Total Bilirubin: 0.3 mg/dL (ref 0.3–1.2)
Total Protein: 5.9 g/dL — ABNORMAL LOW (ref 6.5–8.1)

## 2018-11-20 LAB — CBC WITH DIFFERENTIAL (CANCER CENTER ONLY)
Abs Immature Granulocytes: 0 10*3/uL (ref 0.00–0.07)
Basophils Absolute: 0 10*3/uL (ref 0.0–0.1)
Basophils Relative: 1 %
Eosinophils Absolute: 0 10*3/uL (ref 0.0–0.5)
Eosinophils Relative: 0 %
HCT: 38.2 % (ref 36.0–46.0)
Hemoglobin: 12.5 g/dL (ref 12.0–15.0)
Immature Granulocytes: 0 %
Lymphocytes Relative: 47 %
Lymphs Abs: 1.8 10*3/uL (ref 0.7–4.0)
MCH: 35.8 pg — ABNORMAL HIGH (ref 26.0–34.0)
MCHC: 32.7 g/dL (ref 30.0–36.0)
MCV: 109.5 fL — ABNORMAL HIGH (ref 80.0–100.0)
Monocytes Absolute: 0.3 10*3/uL (ref 0.1–1.0)
Monocytes Relative: 8 %
Neutro Abs: 1.7 10*3/uL (ref 1.7–7.7)
Neutrophils Relative %: 44 %
Platelet Count: 173 10*3/uL (ref 150–400)
RBC: 3.49 MIL/uL — ABNORMAL LOW (ref 3.87–5.11)
RDW: 13.2 % (ref 11.5–15.5)
WBC Count: 3.9 10*3/uL — ABNORMAL LOW (ref 4.0–10.5)
nRBC: 0 % (ref 0.0–0.2)

## 2018-11-20 LAB — VITAMIN B12: Vitamin B-12: 290 pg/mL (ref 180–914)

## 2018-11-20 MED ORDER — SODIUM CHLORIDE 0.9 % IV SOLN
Freq: Once | INTRAVENOUS | Status: AC
  Administered 2018-11-20: 10:00:00 via INTRAVENOUS
  Filled 2018-11-20: qty 250

## 2018-11-20 MED ORDER — SODIUM CHLORIDE 0.9% FLUSH
10.0000 mL | INTRAVENOUS | Status: DC | PRN
  Administered 2018-11-20: 13:00:00 10 mL
  Filled 2018-11-20: qty 10

## 2018-11-20 MED ORDER — PROCHLORPERAZINE MALEATE 10 MG PO TABS
ORAL_TABLET | ORAL | Status: AC
  Filled 2018-11-20: qty 1

## 2018-11-20 MED ORDER — SODIUM CHLORIDE 0.9 % IV SOLN
16.0000 mg/kg | Freq: Once | INTRAVENOUS | Status: AC
  Administered 2018-11-20: 11:00:00 1200 mg via INTRAVENOUS
  Filled 2018-11-20: qty 60

## 2018-11-20 MED ORDER — DIPHENHYDRAMINE HCL 25 MG PO CAPS
ORAL_CAPSULE | ORAL | Status: AC
  Filled 2018-11-20: qty 2

## 2018-11-20 MED ORDER — DIPHENHYDRAMINE HCL 25 MG PO CAPS
50.0000 mg | ORAL_CAPSULE | Freq: Once | ORAL | Status: AC
  Administered 2018-11-20: 10:00:00 50 mg via ORAL

## 2018-11-20 MED ORDER — PROCHLORPERAZINE MALEATE 10 MG PO TABS
10.0000 mg | ORAL_TABLET | Freq: Once | ORAL | Status: AC
  Administered 2018-11-20: 10 mg via ORAL

## 2018-11-20 MED ORDER — ACETAMINOPHEN 325 MG PO TABS
650.0000 mg | ORAL_TABLET | Freq: Once | ORAL | Status: AC
  Administered 2018-11-20: 10:00:00 650 mg via ORAL

## 2018-11-20 MED ORDER — SODIUM CHLORIDE 0.9% FLUSH
10.0000 mL | Freq: Once | INTRAVENOUS | Status: AC
  Administered 2018-11-20: 10 mL
  Filled 2018-11-20: qty 10

## 2018-11-20 MED ORDER — ACETAMINOPHEN 325 MG PO TABS
ORAL_TABLET | ORAL | Status: AC
  Filled 2018-11-20: qty 2

## 2018-11-20 MED ORDER — DEXAMETHASONE SODIUM PHOSPHATE 10 MG/ML IJ SOLN
10.0000 mg | Freq: Once | INTRAMUSCULAR | Status: AC
  Administered 2018-11-20: 10 mg via INTRAVENOUS

## 2018-11-20 MED ORDER — DEXAMETHASONE SODIUM PHOSPHATE 10 MG/ML IJ SOLN
INTRAMUSCULAR | Status: AC
  Filled 2018-11-20: qty 1

## 2018-11-20 MED ORDER — HEPARIN SOD (PORK) LOCK FLUSH 100 UNIT/ML IV SOLN
500.0000 [IU] | Freq: Once | INTRAVENOUS | Status: AC | PRN
  Administered 2018-11-20: 13:00:00 500 [IU]
  Filled 2018-11-20: qty 5

## 2018-11-20 NOTE — Progress Notes (Signed)
[place] OFFICE PROGRESS NOTE  Patient Care Team: [name], [name], MD as PCP - General (Internal Medicine) [name], MD as PCP - Cardiology (Cardiology) [name], MD as Referring Physician (Hematology and Oncology) [name], MD as Consulting Physician (Internal Medicine) [name], [name], MD as Consulting Physician (Infectious Diseases) [name], MD as Consulting Physician (Hematology and Oncology) [name], NP as Nurse Practitioner (Hematology and Oncology)  ASSESSMENT & PLAN:  Multiple myeloma [facility]) The patient has achieved very good partial remission She will continue daratumumab along with pomalidomide She has completely tapered off dexamethasone She will continue acyclovir for antimicrobial prophylaxis She will continue aspirin for DVT prophylaxis She will continue calcium with vitamin D and IV bisphosphonate every 3 months; due to inability to get dental clearance, we will hold off Zometa for now  Vitamin B12 deficiency without anemia She has been taking oral vitamin B12 replacement therapy I plan to recheck vitamin B12 level today and will call her with test results If she cannot improve for B12 by mouth, I recommend IM injection   Pancytopenia, acquired ([place]) She has mild intermittent leukopenia likely due to side effects of treatment She is not symptomatic She will continue acyclovir for antimicrobial prophylaxis   No orders of the defined types were placed in this encounter.   INTERVAL HISTORY: Please see below for problem oriented charting. She returns for further follow-up She has numerous questions related to social distancing and precaution against COVID-19 and her daughter visiting She was seen by cardiologist with medical changes to her heart medications She denies recent chest pain or shortness of breath.  Her daughter, brother, was recently diagnosed with heart attack and have  bypass surgery and is recovering well She has been exercising on a regular basis Her blood pressure control at home is satisfactory She denies new bone pain She has appointment pending to see her dentist  SUMMARY OF ONCOLOGIC HISTORY: Oncology History   Multiple myeloma, Ig A Lambda, M spike 3.54 grams, Calcium 9.2, Creatinine 0.8, Beta 2 microglobulin 4.52, IgA 4840 mg/dL, lambda light chain 75.4, albumin 3.6, hemoglobin 9.7, platelet 115    Primary site: Multiple Myeloma   Staging method: AJCC 6th Edition   Clinical: Stage IIA signed by [name], MD on 11/07/2013  2:46 PM   Summary: Stage IIA        Multiple myeloma ([place])   10/31/2013 Bone Marrow Biopsy    Bone marrow biopsy confirmed multiple myeloma with 40% bone marrow involvement. Skeletal survey showed minimal lesions in her score with generalized demineralization    11/10/2013 - 02/13/2014 Chemotherapy    The patient is started on induction chemotherapy with weekly dexamethasone 40 mg by mouth as well as Velcade subcutaneous injection on days 1, 4, 8 and 11. On 11/21/2013, she was started on monthly Zometa.    12/23/2013 Adverse Reaction    The dose of Velcade was reduced due to thrombocytopenia.    01/28/2014 - 04/07/2014 Chemotherapy    Revlimid is added. Treatment was discontinued due to lack of response.    02/24/2014 - 04/07/2014 Chemotherapy    Due to worsening peripheral neuropathy, Velcade injection is changed to once a week. Revlimid was given 21 days on, 7 days off.    04/07/2014 - 04/10/2014 Chemotherapy    Revlimid was discontinued due to lack of response. Chemotherapy was changed back to Velcade injection twice a week, 2 weeks on 1 week off. Her treatment was  switched to to minimum response    04/20/2014 - 06/02/2014 Chemotherapy    chemotherapy is switched to Carfilzomib, Cytoxan and dexamethasone.    04/22/2014 Procedure    she has placement of port for chemotherapy.    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 [facility]    10/20/2014 - 03/24/2015 Chemotherapy     she received chemotherapy with Kyprolis, Revlimid and dexamethasone    10/22/2014 Procedure    She has port placement    01/19/2015 Tumor Marker    IgA lambda M spike at 0.4 g     01/20/2015 Miscellaneous    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 No cource was found and fever resolved with IV vancomycin and meropenem    09/13/2015 Bone Marrow Biopsy    Bone marrow biopsy showed no increased blasts, 3-4 % plasma cells    03/02/2016 Bone Marrow Biopsy    Bone marrow biopsy at [facility] showed normocellular (30%) bone marrow with trilineage hematopoiesis. No significant increase in blasts. No significant increase in plasma cells.    05/12/2016 Imaging    DEXA scan at [place] showed osteopenia    10/24/2016 Imaging    Skeletal survey at [facility], no new lesions    12/07/2017 Imaging    No focal abnormality noted to suggest myeloma. Exam is stable from prior exam.    03/01/2018 Procedure    Successful 8 French right internal jugular vein power port placement with its tip at the SVC/RA junction.     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    10/31/2015 Miscellaneous    She has engrafted     REVIEW OF SYSTEMS:   Constitutional: Denies fevers, chills or abnormal weight loss Eyes: Denies blurriness of vision Ears, nose, mouth, throat,  and face: Denies mucositis or sore throat Respiratory: Denies cough, dyspnea or wheezes Cardiovascular: Denies palpitation, chest discomfort or lower extremity swelling Gastrointestinal:  Denies nausea, heartburn or change in bowel habits Skin: Denies abnormal skin rashes Lymphatics: Denies new lymphadenopathy or easy bruising Neurological:Denies numbness, tingling or new weaknesses Behavioral/Psych: Mood is stable, no new changes  All other systems were reviewed with the patient and are negative.  I have reviewed the past medical history, past surgical history, social history and family history with the patient and they are unchanged from previous note.  ALLERGIES:  has No Known Allergies.  MEDICATIONS:  Current Outpatient Medications  Medication Sig Dispense Refill  . acyclovir (ZOVIRAX) 400 MG tablet Take 1 tablet (400 mg total) by mouth 2 (two) times daily. 180 tablet 11  .  aspirin 81 MG chewable tablet Chew 81 mg by mouth daily. Takes 21 days out of the month with chemo drug    . calcipotriene (DOVONOX) 0.005 % cream daily.     . calcium carbonate (TUMS - DOSED IN MG ELEMENTAL CALCIUM) 500 MG chewable tablet Chew 1 tablet by mouth daily.    . carvedilol (COREG) 12.5 MG tablet Take 1 tablet (12.5 mg total) by mouth 2 (two) times daily with a meal. 180 tablet 3  . Cholecalciferol (VITAMIN D-1000 MAX ST) 1000 units tablet Take 1,000 Units by mouth daily.    . cyanocobalamin 1000 MCG tablet Take 1,000 mcg by mouth daily.    Marland Kitchen DARATUMUMAB IV Inject into the vein every 30 (thirty) days.    Marland Kitchen docusate sodium (COLACE) 100 MG capsule Take 100 mg by mouth daily as needed for mild constipation.     Marland Kitchen loperamide (IMODIUM) 2 MG capsule Take by mouth as needed for diarrhea or loose stools. As needed only    . mirtazapine (REMERON) 15 MG tablet Take 1 tablet (15 mg total) by mouth at bedtime. 90 tablet 1  . Multiple Vitamin (MULTIVITAMIN WITH MINERALS) TABS tablet Take 1 tablet by mouth daily.    . Multiple Vitamins-Minerals (ICAPS AREDS 2) CAPS Take 1 capsule by mouth 2 (two) times daily.    . ondansetron (ZOFRAN) 8 MG tablet Take 1 tablet (8 mg total) by mouth every 8 (eight) hours as needed (Nausea or vomiting). 30 tablet 1  . pantoprazole (PROTONIX) 40 MG tablet Take 1 tablet (40 mg total) by mouth daily. Take 30-60 minutes before breakfast and dinner 180 tablet 2  . pomalidomide (POMALYST) 2 MG capsule Take 1 capsule (2 mg total) by mouth daily. Take with water on days 1-21. Repeat every 28 days. 21 capsule 0  . prochlorperazine (COMPAZINE) 10 MG tablet Take 1 tablet (10 mg total) by mouth every 6 (six) hours as needed (Nausea or vomiting). 30 tablet 1   No current facility-administered medications for this visit.     PHYSICAL EXAMINATION: ECOG PERFORMANCE STATUS: 1 - Symptomatic but completely ambulatory  Vitals:   11/20/18 0849  BP: (!) 129/58  Pulse: 62  Resp: 18   Temp: 98 F (36.7 C)  SpO2: 100%   Filed Weights   11/20/18 0849  Weight: 165 lb 3.2 oz (74.9 kg)    GENERAL:alert, no distress and comfortable SKIN: skin color, texture, turgor are normal, no rashes or significant lesions EYES: normal, Conjunctiva are pink and non-injected, sclera clear OROPHARYNX:no exudate, no erythema and lips, buccal mucosa, and tongue normal  NECK: supple, thyroid normal size, non-tender, without nodularity LYMPH:  no palpable lymphadenopathy in the cervical, axillary or  inguinal LUNGS: clear to auscultation and percussion with normal breathing effort HEART: regular rate & rhythm and no murmurs and no lower extremity edema ABDOMEN:abdomen soft, non-tender and normal bowel sounds Musculoskeletal:no cyanosis of digits and no clubbing  NEURO: alert & oriented x 3 with fluent speech, no focal motor/sensory deficits  LABORATORY DATA:  I have reviewed the data as listed    Component Value Date/Time   NA 140 11/20/2018 0826   NA 142 01/04/2017 0902   K 4.0 11/20/2018 0826   K 3.9 01/04/2017 0902   CL 108 11/20/2018 0826   CO2 24 11/20/2018 0826   CO2 28 01/04/2017 0902   GLUCOSE 89 11/20/2018 0826   GLUCOSE 92 01/04/2017 0902   BUN 11 11/20/2018 0826   BUN 9.5 01/04/2017 0902   CREATININE 0.75 11/20/2018 0826   CREATININE 0.7 01/04/2017 0902   CALCIUM 8.1 (L) 11/20/2018 0826   CALCIUM 9.0 01/04/2017 0902   PROT 5.9 (L) 11/20/2018 0826   PROT 6.0 (L) 01/04/2017 0902   PROT 5.8 (L) 01/04/2017 0902   ALBUMIN 3.3 (L) 11/20/2018 0826   ALBUMIN 3.4 (L) 01/04/2017 0902   AST 13 (L) 11/20/2018 0826   AST 21 01/04/2017 0902   ALT 11 11/20/2018 0826   ALT 16 01/04/2017 0902   ALKPHOS 73 11/20/2018 0826   ALKPHOS 101 01/04/2017 0902   BILITOT 0.3 11/20/2018 0826   BILITOT 0.56 01/04/2017 0902   GFRNONAA >60 11/20/2018 0826   GFRAA >60 11/20/2018 0826    No results found for: SPEP, UPEP  Lab Results  Component Value Date   WBC 3.9 (L) 11/20/2018    NEUTROABS 1.7 11/20/2018   HGB 12.5 11/20/2018   HCT 38.2 11/20/2018   MCV 109.5 (H) 11/20/2018   PLT 173 11/20/2018      Chemistry      Component Value Date/Time   NA 140 11/20/2018 0826   NA 142 01/04/2017 0902   K 4.0 11/20/2018 0826   K 3.9 01/04/2017 0902   CL 108 11/20/2018 0826   CO2 24 11/20/2018 0826   CO2 28 01/04/2017 0902   BUN 11 11/20/2018 0826   BUN 9.5 01/04/2017 0902   CREATININE 0.75 11/20/2018 0826   CREATININE 0.7 01/04/2017 0902      Component Value Date/Time   CALCIUM 8.1 (L) 11/20/2018 0826   CALCIUM 9.0 01/04/2017 0902   ALKPHOS 73 11/20/2018 0826   ALKPHOS 101 01/04/2017 0902   AST 13 (L) 11/20/2018 0826   AST 21 01/04/2017 0902   ALT 11 11/20/2018 0826   ALT 16 01/04/2017 0902   BILITOT 0.3 11/20/2018 0826   BILITOT 0.56 01/04/2017 0902      All questions were answered. The patient knows to call the clinic with any problems, questions or concerns. No barriers to learning was detected.  I spent 15 minutes counseling the patient face to face. The total time spent in the appointment was 20 minutes and more than 50% was on counseling and review of test results  [name], MD 11/20/2018 9:17 AM

## 2018-11-20 NOTE — Patient Instructions (Signed)
[facility] Discharge Instructions for Patients Receiving Chemotherapy  Today you received the following chemotherapy agents:  daratumumab (Darzalex)  To help prevent nausea and vomiting after your treatment, we encourage you to take your nausea medication as prescribed.   If you develop nausea and vomiting that is not controlled by your nausea medication, call the clinic.   BELOW ARE SYMPTOMS THAT SHOULD BE REPORTED IMMEDIATELY:  *FEVER GREATER THAN 100.5 F  *CHILLS WITH OR WITHOUT FEVER  NAUSEA AND VOMITING THAT IS NOT CONTROLLED WITH YOUR NAUSEA MEDICATION  *UNUSUAL SHORTNESS OF BREATH  *UNUSUAL BRUISING OR BLEEDING  TENDERNESS IN MOUTH AND THROAT WITH OR WITHOUT PRESENCE OF ULCERS  *URINARY PROBLEMS  *BOWEL PROBLEMS  UNUSUAL RASH Items with * indicate a potential emergency and should be followed up as soon as possible.  Feel free to call the clinic should you have any questions or concerns. The clinic phone number is [phone].  Please show the [facility] at check-in to the Emergency Department and triage nurse.   Coronavirus (COVID-19) Are you at risk?  Are you at risk for the Coronavirus (COVID-19)?  To be considered HIGH RISK for Coronavirus (COVID-19), you have to meet the following criteria:  . Traveled to [place], [place], [place], [place] or [place]; or in the [place] to [place], [place], [place], or [place]; and have fever, cough, and shortness of breath within the last 2 weeks of travel OR . Been in close contact with a person diagnosed with COVID-19 within the last 2 weeks and have fever, cough, and shortness of breath . IF YOU DO NOT MEET THESE CRITERIA, YOU ARE CONSIDERED LOW RISK FOR COVID-19.  What to do if you are HIGH RISK for COVID-19?  Marland Kitchen If you are having a medical emergency, call 911. . Seek medical care right away. Before you go to a doctor's office, urgent care or emergency department, call ahead and tell  them about your recent travel, contact with someone diagnosed with COVID-19, and your symptoms. You should receive instructions from your physician's office regarding next steps of care.  . When you arrive at healthcare provider, tell the healthcare staff immediately you have returned from visiting [place], [place], [place], [place] or [place]; or traveled in the [place] to [place], [place], [place], or [place]; in the last two weeks or you have been in close contact with a person diagnosed with COVID-19 in the last 2 weeks.   . Tell the health care staff about your symptoms: fever, cough and shortness of breath. . After you have been seen by a medical provider, you will be either: o Tested for (COVID-19) and discharged home on quarantine except to seek medical care if symptoms worsen, and asked to  - Stay home and avoid contact with others until you get your results (4-5 days)  - Avoid travel on public transportation if possible (such as bus, train, or airplane) or o Sent to the Emergency Department by EMS for evaluation, COVID-19 testing, and possible admission depending on your condition and test results.  What to do if you are LOW RISK for COVID-19?  Reduce your risk of any infection by using the same precautions used for avoiding the common cold or flu:  Marland Kitchen Wash your hands often with soap and warm water for at least 20 seconds.  If soap and water are not readily available, use an alcohol-based hand sanitizer with at least 60% alcohol.  . If  coughing or sneezing, cover your mouth and nose by coughing or sneezing into the elbow areas of your shirt or coat, into a tissue or into your sleeve (not your hands). . Avoid shaking hands with others and consider head nods or verbal greetings only. . Avoid touching your eyes, nose, or mouth with unwashed hands.  . Avoid close contact with people who are sick. . Avoid places or events with large numbers of people in one location, like concerts or  sporting events. . Carefully consider travel plans you have or are making. . If you are planning any travel outside or inside the [place], visit the CDC's Travelers' Health webpage for the latest health notices. . If you have some symptoms but not all symptoms, continue to monitor at home and seek medical attention if your symptoms worsen. . If you are having a medical emergency, call 911.   ADDITIONAL HEALTHCARE OPTIONS FOR PATIENTS  [facility] / e-Visit: [email or web address]         [facility]: [phone]  [facility]: [phone]                   [facility]: [phone]   

## 2018-11-20 NOTE — Assessment & Plan Note (Signed)
She has been taking oral vitamin B12 replacement therapy I plan to recheck vitamin B12 level today and will call her with test results If she cannot improve for B12 by mouth, I recommend IM injection

## 2018-11-20 NOTE — Assessment & Plan Note (Signed)
She has mild intermittent leukopenia likely due to side effects of treatment She is not symptomatic She will continue acyclovir for antimicrobial prophylaxis

## 2018-11-20 NOTE — Assessment & Plan Note (Signed)
The patient has achieved very good partial remission She will continue daratumumab along with pomalidomide She has completely tapered off dexamethasone She will continue acyclovir for antimicrobial prophylaxis She will continue aspirin for DVT prophylaxis She will continue calcium with vitamin D and IV bisphosphonate every 3 months; due to inability to get dental clearance, we will hold off Zometa for now

## 2018-11-21 LAB — KAPPA/LAMBDA LIGHT CHAINS
Kappa free light chain: 25 mg/L — ABNORMAL HIGH (ref 3.3–19.4)
Kappa, lambda light chain ratio: 1.17 (ref 0.26–1.65)
Lambda free light chains: 21.4 mg/L (ref 5.7–26.3)

## 2018-11-22 LAB — MULTIPLE MYELOMA PANEL, SERUM
Albumin SerPl Elph-Mcnc: 3.1 g/dL (ref 2.9–4.4)
Albumin/Glob SerPl: 1.5 (ref 0.7–1.7)
Alpha 1: 0.2 g/dL (ref 0.0–0.4)
Alpha2 Glob SerPl Elph-Mcnc: 0.8 g/dL (ref 0.4–1.0)
B-Globulin SerPl Elph-Mcnc: 0.7 g/dL (ref 0.7–1.3)
Gamma Glob SerPl Elph-Mcnc: 0.5 g/dL (ref 0.4–1.8)
Globulin, Total: 2.2 g/dL (ref 2.2–3.9)
IgA: 206 mg/dL (ref 64–422)
IgG (Immunoglobin G), Serum: 500 mg/dL — ABNORMAL LOW (ref 586–1602)
IgM (Immunoglobulin M), Srm: 66 mg/dL (ref 26–217)
Total Protein ELP: 5.3 g/dL — ABNORMAL LOW (ref 6.0–8.5)

## 2018-12-02 DIAGNOSIS — R002 Palpitations: Secondary | ICD-10-CM | POA: Diagnosis not present

## 2018-12-03 ENCOUNTER — Encounter: Payer: Self-pay | Admitting: Hematology and Oncology

## 2018-12-04 ENCOUNTER — Other Ambulatory Visit: Payer: Self-pay

## 2018-12-05 NOTE — Progress Notes (Signed)
Cardiology Office Note Date:  12/10/2018  Patient ID:  Jean Davidson, Jean Davidson Apr 16, 1946, MRN [id number] PCP:  Haywood Pao, MD  Cardiologist:  Dr. Fletcher Anon, MD    Chief Complaint: Follow up [place]  History of Present Illness: Jean Davidson is a 73 y.o. female with history of paroxysmal SVT, multiple myeloma s/p bone marrow transplant in 2016, MDS s/p bone marrow transplant in 06/2018, pericardial effusion, HTN, palpitations, anemia, and cervical spine stenosis who presents for follow up of recent Zio monitor.   She was previously seen by Dr. Fletcher Anon in 2016 for palpitations with Holter monitor showing NSR with one PVC as well as several PACs. Echo was overall normal as was a treadmill stress test. In the setting of hypertension, she was started on Coreg. She has known multiple myeloma with relapsing disease noted in 2016. She is followed by [facility]. At some point, she developed a large pericardial effusion and was seen by [facility] cardiology with initial plan of pericardiocentesis, though the effusion decreased in size without intervention. Echo in 2018 showed a small pericardial effusion. She was seen in our office in 10/2017 and was doing well. EKG at that time did not show low voltage.   She called on 11/01/2018 noting oncology nurse questioning if the patient was in Afib with reported recheck indicating no Afib (details are unclear). She also noted variable BP with readings ranging from 197/106 to 123/97. Her Coreg was increased to 6.25 mg bid. She was seen in telemedicine follow up on 11/05/2018 and was doing well from a cardiac perspective. She noted increased anxiety with regards to COVID-19 and social distancing. In this setting, she noted her BP had been running high. Her Coreg was titrated to 12.5 mg bid. Echo was ordered and is pending. Zio preliminary read showed a predominant rhythm of sinus with an average heart rate of 71 bpm (range 51 (2:47 AM) -167 bpm), 34 SVT runs occurred with the fastest  interval lasting 4 beats with a maximum rate of 167 bpm and the longest interval lasting 15 beats with an average heart rate of 136 bpm, isolated PACs, atrial couplet and triplets were rare, isolated PVCs and ventricular triplets were rare. There were no patient triggered events.   Labs: 10/2018 - WBC 3.9, HGB 12.5, PLT 173, K+ 4.0, SCr 0.75, albumin 3.3, AST 13, ALT 11  Patient comes in doing well from a cardiac perspective.  She denies any chest pain, palpitations, dizziness, presyncope, or syncope.  She only notes some mild shortness of breath when she is walking up an incline and has been noted this her entire life with symptoms being unchanged.  She did not have any symptoms of palpitations or unusual shortness of breath while wearing her heart monitor.  She has not had any falls since I last saw her.  No lower extremity swelling, orthopnea, PND, or early satiety.  Since increasing Coreg her blood pressure has been well controlled in the 683M systolic.    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   aspirin 81 MG chewable tablet Chew 81 mg by mouth daily. Takes 21 days out of the month with chemo drug   calcipotriene (DOVONOX) 0.005 % cream daily.    calcium carbonate (TUMS - DOSED IN MG ELEMENTAL CALCIUM) 500 MG chewable tablet Chew 1 tablet by mouth daily.   carvedilol (COREG) 12.5 MG tablet Take 1 tablet (12.5 mg total) by mouth 2 (two) times daily with a meal.   Cholecalciferol (VITAMIN D-1000 MAX ST) 1000 units tablet Take 1,000 Units by mouth daily.   cyanocobalamin 1000 MCG tablet Take 1,000 mcg by mouth daily.   DARATUMUMAB IV Inject into the vein every 30 (thirty) days.   docusate sodium (COLACE) 100 MG capsule Take 100 mg by mouth daily as needed for mild constipation.    loperamide (IMODIUM) 2 MG capsule Take by mouth as needed for diarrhea or loose stools. As needed only   mirtazapine (REMERON) 15 MG tablet Take 1 tablet (15 mg total) by mouth at bedtime.   Multiple Vitamin (MULTIVITAMIN WITH MINERALS) TABS tablet Take 1 tablet by mouth daily.   Multiple Vitamins-Minerals (ICAPS AREDS 2) CAPS Take 1 capsule by mouth 2 (two) times daily.   ondansetron (ZOFRAN) 8 MG tablet Take 1 tablet (8 mg total) by mouth every 8 (eight) hours as needed (Nausea or vomiting).   pantoprazole (PROTONIX) 40 MG tablet Take 1 tablet (40 mg total) by mouth daily. Take 30-60 minutes before breakfast and dinner   pomalidomide (POMALYST) 2 MG capsule Take 1 capsule (2 mg total) by mouth daily. Take with water on days 1-21. Repeat every 28 days.   prochlorperazine (COMPAZINE) 10 MG tablet Take 1 tablet (10 mg total) by mouth every 6 (six) hours as needed (Nausea or vomiting).    Allergies:   Patient has no known  allergies.   Social History:  The patient  reports that she quit smoking about 50 years ago. She has a 2.00 pack-year smoking history. She has never used smokeless tobacco. She reports current alcohol use of about 1.0 standard drinks of alcohol per week. She reports that she does not use drugs.   Family History:  The patient's family history includes Colon cancer (age of onset: 96) in her paternal aunt; Diabetes in her father; Heart disease in her brother, father, and mother; Heart failure in her father; Hypertension in her brother; Kidney disease in her father; Skin cancer in her brother and father; Throat cancer in her father.  ROS:   Review of Systems  Constitutional: Negative for chills, diaphoresis, fever, malaise/fatigue and weight loss.  HENT: Negative for congestion.  Eyes: Negative for discharge and redness.  Respiratory: Positive for shortness of breath. Negative for cough, hemoptysis, sputum production and wheezing.        Longstanding stable shortness of breath when walking up an incline  Cardiovascular: Negative for chest pain, palpitations, orthopnea, claudication, leg swelling and PND.  Gastrointestinal: Negative for abdominal pain, blood in stool, heartburn, melena, nausea and vomiting.  Genitourinary: Negative for hematuria.  Musculoskeletal: Negative for falls and myalgias.  Skin: Negative for rash.  Neurological: Negative for dizziness, tingling, tremors, sensory change, speech change, focal weakness, loss of consciousness and weakness.  Endo/Heme/Allergies: Does not bruise/bleed easily.  Psychiatric/Behavioral: Negative for substance abuse. The patient is not nervous/anxious.   All other systems reviewed and are negative.    PHYSICAL EXAM:  VS:  BP 124/78 (BP Location: Left Arm, Patient Position: Sitting, Cuff Size: Normal)    Pulse 64    Ht _0  (1.575 m)    Wt 162 lb 8 oz (73.7 kg)    BMI 29.72 kg/m  BMI: Body mass index is 29.72 kg/m.  Physical Exam    Constitutional: She is oriented to person, place, and time. She appears well-developed and well-nourished.  HENT:  Head: Normocephalic and atraumatic.  Eyes: Right eye exhibits no discharge. Left eye exhibits no discharge.  Neck: Normal range of motion. No JVD present.  Cardiovascular: Normal rate, regular rhythm, S1 normal, S2 normal and normal heart sounds. Exam reveals no distant heart sounds, no friction rub, no midsystolic click and no opening snap.  No murmur heard. Pulses:      Posterior tibial pulses are 2+ on the right side and 2+ on the left side.  Pulses paradoxus 10 mmHg  Pulmonary/Chest: Effort normal and breath sounds normal. No respiratory distress. She has no decreased breath sounds. She has no wheezes. She has no rales. She exhibits no tenderness.  Abdominal: Soft. She exhibits no distension. There is no abdominal tenderness.  Musculoskeletal:        General: No edema.  Neurological: She is alert and oriented to person, place, and time.  Skin: Skin is warm and dry. No cyanosis. Nails show no clubbing.  Psychiatric: She has a normal mood and affect. Her speech is normal and behavior is normal. Judgment and thought content normal.     EKG:  Was ordered and interpreted by me today. Shows NSR,64 bpm, low voltage along precordial leads, no acute st/t changes (grossly unchanged from prior)  Recent Labs: 12/20/2017: Magnesium 2.0 11/20/2018: ALT 11; BUN 11; Creatinine 0.75; Hemoglobin 12.5; Platelet Count 173; Potassium 4.0; Sodium 140  No results found for requested labs within last 8760 hours.   Estimated Creatinine Clearance: 59.7 mL/min (by C-G formula based on SCr of 0.75 mg/dL).   Wt Readings from Last 3 Encounters:  12/10/18 162 lb 8 oz (73.7 kg)  11/20/18 165 lb 3.2 oz (74.9 kg)  11/05/18 162 lb (73.5 kg)     Other studies reviewed: Additional studies/records reviewed today include: summarized above  ASSESSMENT AND PLAN:  1. Paroxysmal SVT: Asymptomatic. Zio  patch showed 34 episodes of SVT with the longest interval being 15 beats with an average rate of 136 bpm and the fastest interval lasting 4 beats with a maximum rate of 167 bpm. Recent potassium at goal from 10/2018. Update TSH and magnesium.  Transition from carvedilol to metoprolol tartrate 25 mg twice daily and escalate as needed for adequate blood pressure and heart rate control.  Should she have sustained atrial ectopy we could  refer to EP for SVT ablation though given short runs at this time we will treat medically for now.  2. History of pericardial effusion: Vitals hemodynamically stable.  Pulses paradoxus 10 mmHg as above.  Low voltage noted on today's EKG is essentially unchanged from study performed in 10/2017 and is nonspecific.  Scheduled for repeat echo in the fall of 2020 to reevaluate.   3. HTN: Blood pressure is well controlled in the office today.  Transition from carvedilol to metoprolol as above with titration as needed in follow-up.  4. Multiple myeloma/MDS: Follow by [facility].   Disposition: F/u with Dr. Fletcher Anon or an APP in 2 months.  Current medicines are reviewed at length with the patient today.  The patient did not have any concerns regarding medicines.  Signed, Christell Faith, PA-C 12/10/2018 9:37 AM     [facility] [address] [place] [address] [place], [place] [address] [phone]

## 2018-12-09 ENCOUNTER — Telehealth: Payer: Self-pay | Admitting: *Deleted

## 2018-12-09 NOTE — Telephone Encounter (Signed)

## 2018-12-10 ENCOUNTER — Other Ambulatory Visit: Payer: Self-pay

## 2018-12-10 ENCOUNTER — Ambulatory Visit: Payer: Medicare HMO | Admitting: Physician Assistant

## 2018-12-10 ENCOUNTER — Other Ambulatory Visit
Admission: RE | Admit: 2018-12-10 | Discharge: 2018-12-10 | Disposition: A | Discharge status: Home / Self Care | Payer: Medicare HMO | Source: Ambulatory Visit | Attending: Physician Assistant | Admitting: Physician Assistant

## 2018-12-10 ENCOUNTER — Encounter: Payer: Self-pay | Admitting: Physician Assistant

## 2018-12-10 VITALS — BP 124/78 | HR 64 | Ht 62.0 in | Wt 162.5 lb

## 2018-12-10 DIAGNOSIS — I2510 Atherosclerotic heart disease of native coronary artery without angina pectoris: Secondary | ICD-10-CM | POA: Insufficient documentation

## 2018-12-10 DIAGNOSIS — I313 Pericardial effusion (noninflammatory): Secondary | ICD-10-CM

## 2018-12-10 DIAGNOSIS — C9000 Multiple myeloma not having achieved remission: Secondary | ICD-10-CM | POA: Diagnosis not present

## 2018-12-10 DIAGNOSIS — I471 Supraventricular tachycardia: Secondary | ICD-10-CM | POA: Diagnosis not present

## 2018-12-10 DIAGNOSIS — I10 Essential (primary) hypertension: Secondary | ICD-10-CM

## 2018-12-10 DIAGNOSIS — I3139 Other pericardial effusion (noninflammatory): Secondary | ICD-10-CM

## 2018-12-10 LAB — MAGNESIUM: Magnesium: 1.8 mg/dL (ref 1.7–2.4)

## 2018-12-10 LAB — TSH: TSH: 2.258 u[IU]/mL (ref 0.350–4.500)

## 2018-12-10 MED ORDER — METOPROLOL TARTRATE 25 MG PO TABS: 25.0000 mg | ORAL_TABLET | Freq: Two times a day (BID) | ORAL | 3 refills | Status: DC

## 2018-12-10 NOTE — Patient Instructions (Addendum)
Medication Instructions:  Your physician has recommended you make the following change in your medication:  1- STOP Coreg 2- START Lopressor 1 tablet (25 mg total) twice daily.   If you need a refill on your cardiac medications before your next appointment, please call your pharmacy.   Lab work: Your physician recommends that you return for lab work today (TSH, Engineer, materials) at the medical mall. No appt is needed. Hours are M-F 7AM- 6 PM. If you have labs (blood work) drawn today and your tests are completely normal, you will receive your results only by: Marland Kitchen MyChart Message (if you have MyChart) OR . A paper copy in the mail If you have any lab test that is abnormal or we need to change your treatment, we will call you to review the results.  Testing/Procedures: None ordered   Follow-Up: At [facility], you and your health needs are our priority.  As part of our continuing mission to provide you with exceptional heart care, we have created designated Provider Care Teams.  These Care Teams include your primary Cardiologist (physician) and Advanced Practice Providers (APPs -  Physician Assistants and Nurse Practitioners) who all work together to provide you with the care you need, when you need it. You will need a follow up appointment in 2 months.   You may see Kathlyn Sacramento, MD or Christell Faith, PA-C.

## 2018-12-18 ENCOUNTER — Inpatient Hospital Stay (HOSPITAL_BASED_OUTPATIENT_CLINIC_OR_DEPARTMENT_OTHER): Payer: Medicare HMO | Admitting: Hematology and Oncology

## 2018-12-18 ENCOUNTER — Encounter: Payer: Self-pay | Admitting: Hematology and Oncology

## 2018-12-18 ENCOUNTER — Other Ambulatory Visit: Payer: Self-pay

## 2018-12-18 ENCOUNTER — Inpatient Hospital Stay: Payer: Medicare HMO

## 2018-12-18 ENCOUNTER — Inpatient Hospital Stay: Payer: Medicare HMO | Attending: Hematology and Oncology

## 2018-12-18 VITALS — BP 122/81 | HR 66 | Temp 98.6°F | Resp 18

## 2018-12-18 DIAGNOSIS — E538 Deficiency of other specified B group vitamins: Secondary | ICD-10-CM | POA: Diagnosis not present

## 2018-12-18 DIAGNOSIS — D469 Myelodysplastic syndrome, unspecified: Secondary | ICD-10-CM

## 2018-12-18 DIAGNOSIS — Z9484 Stem cells transplant status: Secondary | ICD-10-CM | POA: Diagnosis not present

## 2018-12-18 DIAGNOSIS — D801 Nonfamilial hypogammaglobulinemia: Secondary | ICD-10-CM

## 2018-12-18 DIAGNOSIS — C9002 Multiple myeloma in relapse: Secondary | ICD-10-CM | POA: Diagnosis not present

## 2018-12-18 DIAGNOSIS — C9000 Multiple myeloma not having achieved remission: Secondary | ICD-10-CM

## 2018-12-18 DIAGNOSIS — Z7982 Long term (current) use of aspirin: Secondary | ICD-10-CM

## 2018-12-18 DIAGNOSIS — Z79899 Other long term (current) drug therapy: Secondary | ICD-10-CM

## 2018-12-18 DIAGNOSIS — Z5112 Encounter for antineoplastic immunotherapy: Secondary | ICD-10-CM | POA: Diagnosis present

## 2018-12-18 DIAGNOSIS — M8580 Other specified disorders of bone density and structure, unspecified site: Secondary | ICD-10-CM

## 2018-12-18 DIAGNOSIS — Z9481 Bone marrow transplant status: Secondary | ICD-10-CM

## 2018-12-18 LAB — CBC WITH DIFFERENTIAL (CANCER CENTER ONLY)
Abs Immature Granulocytes: 0.01 10*3/uL (ref 0.00–0.07)
Basophils Absolute: 0 10*3/uL (ref 0.0–0.1)
Basophils Relative: 1 %
Eosinophils Absolute: 0 10*3/uL (ref 0.0–0.5)
Eosinophils Relative: 0 %
HCT: 40.5 % (ref 36.0–46.0)
Hemoglobin: 13.2 g/dL (ref 12.0–15.0)
Immature Granulocytes: 0 %
Lymphocytes Relative: 50 %
Lymphs Abs: 2 10*3/uL (ref 0.7–4.0)
MCH: 35.7 pg — ABNORMAL HIGH (ref 26.0–34.0)
MCHC: 32.6 g/dL (ref 30.0–36.0)
MCV: 109.5 fL — ABNORMAL HIGH (ref 80.0–100.0)
Monocytes Absolute: 0.3 10*3/uL (ref 0.1–1.0)
Monocytes Relative: 8 %
Neutro Abs: 1.6 10*3/uL — ABNORMAL LOW (ref 1.7–7.7)
Neutrophils Relative %: 41 %
Platelet Count: 211 10*3/uL (ref 150–400)
RBC: 3.7 MIL/uL — ABNORMAL LOW (ref 3.87–5.11)
RDW: 13.2 % (ref 11.5–15.5)
WBC Count: 4 10*3/uL (ref 4.0–10.5)
nRBC: 0 % (ref 0.0–0.2)

## 2018-12-18 LAB — CMP (CANCER CENTER ONLY)
ALT: 14 U/L (ref 0–44)
AST: 18 U/L (ref 15–41)
Albumin: 3.7 g/dL (ref 3.5–5.0)
Alkaline Phosphatase: 74 U/L (ref 38–126)
Anion gap: 10 (ref 5–15)
BUN: 14 mg/dL (ref 8–23)
CO2: 24 mmol/L (ref 22–32)
Calcium: 8.6 mg/dL — ABNORMAL LOW (ref 8.9–10.3)
Chloride: 107 mmol/L (ref 98–111)
Creatinine: 0.8 mg/dL (ref 0.44–1.00)
GFR, Est AFR Am: >60 mL/min (ref >60)
GFR, Estimated: >60 mL/min (ref >60)
Glucose, Bld: 90 mg/dL (ref 70–99)
Potassium: 4.1 mmol/L (ref 3.5–5.1)
Sodium: 141 mmol/L (ref 135–145)
Total Bilirubin: 0.3 mg/dL (ref 0.3–1.2)
Total Protein: 6.4 g/dL — ABNORMAL LOW (ref 6.5–8.1)

## 2018-12-18 MED ORDER — SODIUM CHLORIDE 0.9 % IV SOLN
16.0000 mg/kg | Freq: Once | INTRAVENOUS | Status: AC
  Administered 2018-12-18: 1200 mg via INTRAVENOUS
  Filled 2018-12-18: qty 60

## 2018-12-18 MED ORDER — HEPARIN SOD (PORK) LOCK FLUSH 100 UNIT/ML IV SOLN
500.0000 [IU] | Freq: Once | INTRAVENOUS | Status: AC | PRN
  Administered 2018-12-18: 500 [IU]
  Filled 2018-12-18: qty 5

## 2018-12-18 MED ORDER — SODIUM CHLORIDE 0.9 % IV SOLN
Freq: Once | INTRAVENOUS | Status: AC
  Administered 2018-12-18: 10:00:00 via INTRAVENOUS
  Filled 2018-12-18: qty 250

## 2018-12-18 MED ORDER — ACETAMINOPHEN 325 MG PO TABS
650.0000 mg | ORAL_TABLET | Freq: Once | ORAL | Status: AC
  Administered 2018-12-18: 650 mg via ORAL

## 2018-12-18 MED ORDER — ACETAMINOPHEN 325 MG PO TABS
ORAL_TABLET | ORAL | Status: AC
  Filled 2018-12-18: qty 2

## 2018-12-18 MED ORDER — DIPHENHYDRAMINE HCL 25 MG PO CAPS
ORAL_CAPSULE | ORAL | Status: AC
  Filled 2018-12-18: qty 1

## 2018-12-18 MED ORDER — PROCHLORPERAZINE MALEATE 10 MG PO TABS
10.0000 mg | ORAL_TABLET | Freq: Once | ORAL | Status: AC
  Administered 2018-12-18: 10 mg via ORAL

## 2018-12-18 MED ORDER — PROCHLORPERAZINE MALEATE 10 MG PO TABS
ORAL_TABLET | ORAL | Status: AC
  Filled 2018-12-18: qty 1

## 2018-12-18 MED ORDER — SODIUM CHLORIDE 0.9% FLUSH
10.0000 mL | Freq: Once | INTRAVENOUS | Status: AC
  Administered 2018-12-18: 10 mL
  Filled 2018-12-18: qty 10

## 2018-12-18 MED ORDER — ZOLEDRONIC ACID 4 MG/100ML IV SOLN
4.0000 mg | Freq: Once | INTRAVENOUS | Status: AC
  Administered 2018-12-18: 4 mg via INTRAVENOUS
  Filled 2018-12-18: qty 100

## 2018-12-18 MED ORDER — DIPHENHYDRAMINE HCL 25 MG PO CAPS
50.0000 mg | ORAL_CAPSULE | Freq: Once | ORAL | Status: AC
  Administered 2018-12-18: 50 mg via ORAL

## 2018-12-18 MED ORDER — DEXAMETHASONE SODIUM PHOSPHATE 10 MG/ML IJ SOLN
10.0000 mg | Freq: Once | INTRAMUSCULAR | Status: AC
  Administered 2018-12-18: 10 mg via INTRAVENOUS

## 2018-12-18 MED ORDER — SODIUM CHLORIDE 0.9% FLUSH
10.0000 mL | INTRAVENOUS | Status: DC | PRN
  Administered 2018-12-18: 10 mL
  Filled 2018-12-18: qty 10

## 2018-12-18 MED ORDER — DEXAMETHASONE SODIUM PHOSPHATE 10 MG/ML IJ SOLN
INTRAMUSCULAR | Status: AC
  Filled 2018-12-18: qty 1

## 2018-12-18 NOTE — Assessment & Plan Note (Addendum)
The patient has achieved very good partial remission; in fact, she had no detectable M spike recently She has appointment to follow-up at [facility] on August 20 She will continue daratumumab along with pomalidomide She has completed tapered off dexamethasone She will continue acyclovir for antimicrobial prophylaxis She will continue aspirin for DVT prophylaxis She will continue calcium with vitamin D and IV bisphosphonate every 3 months; due today.  She has normal evaluation by dentist recently

## 2018-12-18 NOTE — Assessment & Plan Note (Signed)
She has appointment to follow-up at [facility] next month

## 2018-12-18 NOTE — Assessment & Plan Note (Signed)
She is not symptomatic We discussed the importance of hand hygiene and social distancing

## 2018-12-18 NOTE — Patient Instructions (Signed)
[facility] Discharge Instructions for Patients Receiving Chemotherapy  Today you received the following chemotherapy agents: Darzalex  To help prevent nausea and vomiting after your treatment, we encourage you to take your nausea medication as directed.   If you develop nausea and vomiting that is not controlled by your nausea medication, call the clinic.   BELOW ARE SYMPTOMS THAT SHOULD BE REPORTED IMMEDIATELY:  *FEVER GREATER THAN 100.5 F  *CHILLS WITH OR WITHOUT FEVER  NAUSEA AND VOMITING THAT IS NOT CONTROLLED WITH YOUR NAUSEA MEDICATION  *UNUSUAL SHORTNESS OF BREATH  *UNUSUAL BRUISING OR BLEEDING  TENDERNESS IN MOUTH AND THROAT WITH OR WITHOUT PRESENCE OF ULCERS  *URINARY PROBLEMS  *BOWEL PROBLEMS  UNUSUAL RASH Items with * indicate a potential emergency and should be followed up as soon as possible.  Feel free to call the clinic should you have any questions or concerns. The clinic phone number is [phone].  Please show the [facility] at check-in to the Emergency Department and triage nurse.   Coronavirus (COVID-19) Are you at risk?  Are you at risk for the Coronavirus (COVID-19)?  To be considered HIGH RISK for Coronavirus (COVID-19), you have to meet the following criteria:  . Traveled to [place], [place], [place], [place] or [place]; or in the [place] to [place], [place], [place], or [place]; and have fever, cough, and shortness of breath within the last 2 weeks of travel OR . Been in close contact with a person diagnosed with COVID-19 within the last 2 weeks and have fever, cough, and shortness of breath . IF YOU DO NOT MEET THESE CRITERIA, YOU ARE CONSIDERED LOW RISK FOR COVID-19.  What to do if you are HIGH RISK for COVID-19?  Marland Kitchen If you are having a medical emergency, call 911. . Seek medical care right away. Before you go to a doctor's office, urgent care or emergency department, call ahead and tell them about your  recent travel, contact with someone diagnosed with COVID-19, and your symptoms. You should receive instructions from your physician's office regarding next steps of care.  . When you arrive at healthcare provider, tell the healthcare staff immediately you have returned from visiting [place], [place], [place], [place] or [place]; or traveled in the [place] to [place], [place], [place], or [place]; in the last two weeks or you have been in close contact with a person diagnosed with COVID-19 in the last 2 weeks.   . Tell the health care staff about your symptoms: fever, cough and shortness of breath. . After you have been seen by a medical provider, you will be either: o Tested for (COVID-19) and discharged home on quarantine except to seek medical care if symptoms worsen, and asked to  - Stay home and avoid contact with others until you get your results (4-5 days)  - Avoid travel on public transportation if possible (such as bus, train, or airplane) or o Sent to the Emergency Department by EMS for evaluation, COVID-19 testing, and possible admission depending on your condition and test results.  What to do if you are LOW RISK for COVID-19?  Reduce your risk of any infection by using the same precautions used for avoiding the common cold or flu:  Marland Kitchen Wash your hands often with soap and warm water for at least 20 seconds.  If soap and water are not readily available, use an alcohol-based hand sanitizer with at least 60% alcohol.  . If coughing or  sneezing, cover your mouth and nose by coughing or sneezing into the elbow areas of your shirt or coat, into a tissue or into your sleeve (not your hands). . Avoid shaking hands with others and consider head nods or verbal greetings only. . Avoid touching your eyes, nose, or mouth with unwashed hands.  . Avoid close contact with people who are sick. . Avoid places or events with large numbers of people in one location, like concerts or sporting  events. . Carefully consider travel plans you have or are making. . If you are planning any travel outside or inside the [name], visit the CDC's Travelers' Health webpage for the latest health notices. . If you have some symptoms but not all symptoms, continue to monitor at home and seek medical attention if your symptoms worsen. . If you are having a medical emergency, call 911.   [place] / e-Visit: [email or web address]         [facility]: [facility]: [phone]                   [facility]: [phone]

## 2018-12-18 NOTE — Assessment & Plan Note (Signed)
Her recent B12 level was satisfactory on oral supplement only She will continue

## 2018-12-18 NOTE — Progress Notes (Signed)
[place] OFFICE PROGRESS NOTE  Patient Care Team: [name], [name], MD as PCP - General (Internal Medicine) [name], MD as PCP - Cardiology (Cardiology) [name], MD as Referring Physician (Hematology and Oncology) [name], MD as Consulting Physician (Internal Medicine) [name], [name], MD as Consulting Physician (Infectious Diseases) [name], MD as Consulting Physician (Hematology and Oncology) [name], NP as Nurse Practitioner (Hematology and Oncology)  ASSESSMENT & PLAN:  Multiple myeloma [facility]) The patient has achieved very good partial remission; in fact, she had no detectable M spike recently She has appointment to follow-up at [facility] on August 20 She will continue daratumumab along with pomalidomide She has completed tapered off dexamethasone She will continue acyclovir for antimicrobial prophylaxis She will continue aspirin for DVT prophylaxis She will continue calcium with vitamin D and IV bisphosphonate every 3 months; due today.  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   INTERVAL HISTORY: Please see below for problem oriented charting. She returns for further follow-up She denies recent infection, fever or chills She had recent cardiology evaluation for SVT Denies palpitation, chest pain or shortness of breath She is still able to exercise She has normal dental evaluation recently  SUMMARY OF ONCOLOGIC HISTORY: Oncology History Overview Note  Multiple myeloma, Ig A  Lambda, M spike 3.54 grams, Calcium 9.2, Creatinine 0.8, Beta 2 microglobulin 4.52, IgA 4840 mg/dL, lambda light chain 75.4, albumin 3.6, hemoglobin 9.7, platelet 115    Primary site: Multiple Myeloma   Staging method: AJCC 6th Edition   Clinical: Stage IIA signed by [name], MD on 11/07/2013  2:46 PM   Summary: Stage IIA      Multiple myeloma ([place])  10/31/2013 Bone Marrow Biopsy   Bone marrow biopsy confirmed multiple myeloma with 40% bone marrow involvement. Skeletal survey showed minimal lesions in her score with generalized demineralization   11/10/2013 - 02/13/2014 Chemotherapy   The patient is started on induction chemotherapy with weekly dexamethasone 40 mg by mouth as well as Velcade subcutaneous injection on days 1, 4, 8 and 11. On 11/21/2013, she was started on monthly Zometa.   12/23/2013 Adverse Reaction   The dose of Velcade was reduced due to thrombocytopenia.   01/28/2014 - 04/07/2014 Chemotherapy   Revlimid is added. Treatment was discontinued due to lack of response.   02/24/2014 - 04/07/2014 Chemotherapy   Due to worsening peripheral neuropathy, Velcade injection is changed to once a week. Revlimid was given 21 days on, 7 days off.   04/07/2014 - 04/10/2014 Chemotherapy   Revlimid was discontinued due to lack of response. Chemotherapy was changed back to Velcade injection twice a week, 2 weeks on 1 week off. Her treatment was switched to to minimum response   04/20/2014 - 06/02/2014 Chemotherapy   chemotherapy is switched to Carfilzomib, Cytoxan and dexamethasone.   04/22/2014 Procedure   she has placement of port for chemotherapy.   06/01/2014 Tumor Marker   Bloodwork show that she has greater than partial response   06/23/2014 Bone Marrow Biopsy   Bone marrow biopsy show 5-10% residual plasma  cells, normal cytogenetics and FISH   07/07/2014 Procedure   She had stem cell collection   07/22/2014 - 07/22/2014 Chemotherapy   She had high-dose chemotherapy with  melphalan   07/23/2014 Bone Marrow Transplant   She had bone marrow transplant in autologous fashion [facility]   10/20/2014 - 03/24/2015 Chemotherapy    she received chemotherapy with Kyprolis, Revlimid and dexamethasone   10/22/2014 Procedure   She has port placement   01/19/2015 Tumor Marker   IgA lambda M spike at 0.4 g    01/20/2015 Miscellaneous   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 No cource was found and fever resolved with IV vancomycin and meropenem   09/13/2015 Bone Marrow Biopsy   Bone marrow biopsy showed no increased blasts, 3-4 % plasma cells   03/02/2016 Bone Marrow Biopsy   Bone marrow biopsy at [facility] showed normocellular (30%) bone marrow with trilineage hematopoiesis. No significant increase in blasts. No significant increase in plasma cells.   05/12/2016 Imaging   DEXA scan at [place] showed osteopenia   10/24/2016 Imaging   Skeletal survey at [facility], no new lesions   12/07/2017 Imaging   No focal abnormality noted to suggest myeloma. Exam is stable from prior exam.   03/01/2018 Procedure   Successful 8 French right internal jugular vein power port placement with its tip at the SVC/RA junction.   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   10/31/2015 Miscellaneous   She has engrafted     REVIEW OF SYSTEMS:   Constitutional: Denies fevers, chills or abnormal weight loss Eyes: Denies blurriness of vision Ears, nose, mouth, throat, and face: Denies mucositis or sore throat Respiratory: Denies cough, dyspnea or wheezes Cardiovascular: Denies palpitation, chest discomfort or lower extremity swelling Gastrointestinal:  Denies nausea, heartburn or change in bowel habits Skin: Denies abnormal skin rashes Lymphatics: Denies new lymphadenopathy or easy bruising Neurological:Denies numbness, tingling or new weaknesses Behavioral/Psych: Mood is stable, no new changes  All other systems were reviewed with the patient and are negative.  I have reviewed the past medical history, past surgical history, social history and family history with the patient and they are unchanged from previous note.  ALLERGIES:  has No Known Allergies.  MEDICATIONS:  Current Outpatient Medications  Medication Sig Dispense Refill  . acyclovir (ZOVIRAX) 400 MG tablet Take 1 tablet (400 mg  total) by mouth 2 (two) times daily. 180 tablet 11  . aspirin 81 MG chewable tablet Chew 81 mg by mouth daily. Takes 21 days out of the month with chemo drug    . calcipotriene (DOVONOX) 0.005 % cream daily.     . calcium carbonate (TUMS - DOSED IN MG ELEMENTAL CALCIUM) 500 MG chewable tablet Chew 1 tablet by mouth daily.    . Cholecalciferol (VITAMIN D-1000 MAX ST) 1000 units tablet Take 1,000 Units  by mouth daily.    . cyanocobalamin 1000 MCG tablet Take 1,000 mcg by mouth daily.    Marland Kitchen DARATUMUMAB IV Inject into the vein every 30 (thirty) days.    Marland Kitchen docusate sodium (COLACE) 100 MG capsule Take 100 mg by mouth daily as needed for mild constipation.     Marland Kitchen loperamide (IMODIUM) 2 MG capsule Take by mouth as needed for diarrhea or loose stools. As needed only    . metoprolol tartrate (LOPRESSOR) 25 MG tablet Take 1 tablet (25 mg total) by mouth 2 (two) times daily. 180 tablet 3  . mirtazapine (REMERON) 15 MG tablet Take 1 tablet (15 mg total) by mouth at bedtime. 90 tablet 1  . Multiple Vitamin (MULTIVITAMIN WITH MINERALS) TABS tablet Take 1 tablet by mouth daily.    . Multiple Vitamins-Minerals (ICAPS AREDS 2) CAPS Take 1 capsule by mouth 2 (two) times daily.    . ondansetron (ZOFRAN) 8 MG tablet Take 1 tablet (8 mg total) by mouth every 8 (eight) hours as needed (Nausea or vomiting). 30 tablet 1  . pantoprazole (PROTONIX) 40 MG tablet Take 1 tablet (40 mg total) by mouth daily. Take 30-60 minutes before breakfast and dinner 180 tablet 2  . pomalidomide (POMALYST) 2 MG capsule Take 1 capsule (2 mg total) by mouth daily. Take with water on days 1-21. Repeat every 28 days. 21 capsule 0  . prochlorperazine (COMPAZINE) 10 MG tablet Take 1 tablet (10 mg total) by mouth every 6 (six) hours as needed (Nausea or vomiting). 30 tablet 1   No current facility-administered medications for this visit.     PHYSICAL EXAMINATION: ECOG PERFORMANCE STATUS: 1 - Symptomatic but completely ambulatory  Vitals:   12/18/18 0928  BP: 139/78  Pulse: 63  Resp: 17  Temp: 98.2 F (36.8 C)  SpO2: 99%   Filed Weights   12/18/18 0928  Weight: 164 lb (74.4 kg)    GENERAL:alert, no distress and comfortable SKIN: skin color, texture, turgor are normal, no rashes or significant lesions EYES: normal, Conjunctiva are pink and non-injected, sclera clear OROPHARYNX:no exudate, no erythema and lips, buccal mucosa, and  tongue normal  NECK: supple, thyroid normal size, non-tender, without nodularity LYMPH:  no palpable lymphadenopathy in the cervical, axillary or inguinal LUNGS: clear to auscultation and percussion with normal breathing effort HEART: regular rate & rhythm and no murmurs and no lower extremity edema ABDOMEN:abdomen soft, non-tender and normal bowel sounds Musculoskeletal:no cyanosis of digits and no clubbing  NEURO: alert & oriented x 3 with fluent speech, no focal motor/sensory deficits  LABORATORY DATA:  I have reviewed the data as listed    Component Value Date/Time   NA 140 11/20/2018 0826   NA 142 01/04/2017 0902   K 4.0 11/20/2018 0826   K 3.9 01/04/2017 0902   CL 108 11/20/2018 0826   CO2 24 11/20/2018 0826   CO2 28 01/04/2017 0902   GLUCOSE 89 11/20/2018 0826   GLUCOSE 92 01/04/2017 0902   BUN 11 11/20/2018 0826  BUN 9.5 01/04/2017 0902   CREATININE 0.75 11/20/2018 0826   CREATININE 0.7 01/04/2017 0902   CALCIUM 8.1 (L) 11/20/2018 0826   CALCIUM 9.0 01/04/2017 0902   PROT 5.9 (L) 11/20/2018 0826   PROT 6.0 (L) 01/04/2017 0902   PROT 5.8 (L) 01/04/2017 0902   ALBUMIN 3.3 (L) 11/20/2018 0826   ALBUMIN 3.4 (L) 01/04/2017 0902   AST 13 (L) 11/20/2018 0826   AST 21 01/04/2017 0902   ALT 11 11/20/2018 0826   ALT 16 01/04/2017 0902   ALKPHOS 73 11/20/2018 0826   ALKPHOS 101 01/04/2017 0902   BILITOT 0.3 11/20/2018 0826   BILITOT 0.56 01/04/2017 0902   GFRNONAA >60 11/20/2018 0826   GFRAA >60 11/20/2018 0826    No results found for: SPEP, UPEP  Lab Results  Component Value Date   WBC 4.0 12/18/2018   NEUTROABS 1.6 (L) 12/18/2018   HGB 13.2 12/18/2018   HCT 40.5 12/18/2018   MCV 109.5 (H) 12/18/2018   PLT 211 12/18/2018      Chemistry      Component Value Date/Time   NA 140 11/20/2018 0826   NA 142 01/04/2017 0902   K 4.0 11/20/2018 0826   K 3.9 01/04/2017 0902   CL 108 11/20/2018 0826   CO2 24 11/20/2018 0826   CO2 28 01/04/2017 0902   BUN 11  11/20/2018 0826   BUN 9.5 01/04/2017 0902   CREATININE 0.75 11/20/2018 0826   CREATININE 0.7 01/04/2017 0902      Component Value Date/Time   CALCIUM 8.1 (L) 11/20/2018 0826   CALCIUM 9.0 01/04/2017 0902   ALKPHOS 73 11/20/2018 0826   ALKPHOS 101 01/04/2017 0902   AST 13 (L) 11/20/2018 0826   AST 21 01/04/2017 0902   ALT 11 11/20/2018 0826   ALT 16 01/04/2017 0902   BILITOT 0.3 11/20/2018 0826   BILITOT 0.56 01/04/2017 0902     All questions were answered. The patient knows to call the clinic with any problems, questions or concerns. No barriers to learning was detected.  I spent 15 minutes counseling the patient face to face. The total time spent in the appointment was 20 minutes and more than 50% was on counseling and review of test results  [name], MD 12/18/2018 9:48 AM

## 2018-12-19 ENCOUNTER — Telehealth: Payer: Self-pay | Admitting: Hematology and Oncology

## 2018-12-19 LAB — MULTIPLE MYELOMA PANEL, SERUM
Albumin SerPl Elph-Mcnc: 3.5 g/dL (ref 2.9–4.4)
Albumin/Glob SerPl: 1.6 (ref 0.7–1.7)
Alpha 1: 0.2 g/dL (ref 0.0–0.4)
Alpha2 Glob SerPl Elph-Mcnc: 0.8 g/dL (ref 0.4–1.0)
B-Globulin SerPl Elph-Mcnc: 0.7 g/dL (ref 0.7–1.3)
Gamma Glob SerPl Elph-Mcnc: 0.5 g/dL (ref 0.4–1.8)
Globulin, Total: 2.2 g/dL (ref 2.2–3.9)
IgA: 207 mg/dL (ref 64–422)
IgG (Immunoglobin G), Serum: 534 mg/dL — ABNORMAL LOW (ref 586–1602)
IgM (Immunoglobulin M), Srm: 67 mg/dL (ref 26–217)
M Protein SerPl Elph-Mcnc: 0.1 g/dL — ABNORMAL HIGH
Total Protein ELP: 5.7 g/dL — ABNORMAL LOW (ref 6.0–8.5)

## 2018-12-19 LAB — KAPPA/LAMBDA LIGHT CHAINS
Kappa free light chain: 22.4 mg/L — ABNORMAL HIGH (ref 3.3–19.4)
Kappa, lambda light chain ratio: 1.14 (ref 0.26–1.65)
Lambda free light chains: 19.7 mg/L (ref 5.7–26.3)

## 2018-12-19 NOTE — Telephone Encounter (Signed)
I talk with patient regarding schedule  

## 2018-12-20 ENCOUNTER — Telehealth: Payer: Self-pay

## 2018-12-20 NOTE — Telephone Encounter (Signed)
-----   Message from [name], MD sent at 12/20/2018  7:33 AM EDT ----- Regarding: myeloma panel Pls call her with results

## 2018-12-20 NOTE — Telephone Encounter (Signed)
Called and given below results. She verbalized understanding.

## 2018-12-23 DIAGNOSIS — H43813 Vitreous degeneration, bilateral: Secondary | ICD-10-CM | POA: Diagnosis not present

## 2018-12-23 DIAGNOSIS — H25813 Combined forms of age-related cataract, bilateral: Secondary | ICD-10-CM | POA: Diagnosis not present

## 2018-12-23 DIAGNOSIS — H353132 Nonexudative age-related macular degeneration, bilateral, intermediate dry stage: Secondary | ICD-10-CM | POA: Diagnosis not present

## 2018-12-23 DIAGNOSIS — H02881 Meibomian gland dysfunction right upper eyelid: Secondary | ICD-10-CM | POA: Diagnosis not present

## 2018-12-23 DIAGNOSIS — H35363 Drusen (degenerative) of macula, bilateral: Secondary | ICD-10-CM | POA: Diagnosis not present

## 2018-12-30 ENCOUNTER — Encounter: Payer: Self-pay | Admitting: Hematology and Oncology

## 2018-12-31 ENCOUNTER — Other Ambulatory Visit: Payer: Self-pay

## 2018-12-31 DIAGNOSIS — C9002 Multiple myeloma in relapse: Secondary | ICD-10-CM

## 2018-12-31 MED ORDER — POMALIDOMIDE 2 MG PO CAPS: 2.0000 mg | ORAL_CAPSULE | Freq: Every day | ORAL | 0 refills | Status: DC

## 2019-01-03 ENCOUNTER — Encounter: Payer: Self-pay | Admitting: Hematology and Oncology

## 2019-01-06 DIAGNOSIS — I10 Essential (primary) hypertension: Secondary | ICD-10-CM | POA: Diagnosis not present

## 2019-01-13 DIAGNOSIS — D469 Myelodysplastic syndrome, unspecified: Secondary | ICD-10-CM | POA: Diagnosis not present

## 2019-01-13 DIAGNOSIS — I6529 Occlusion and stenosis of unspecified carotid artery: Secondary | ICD-10-CM | POA: Diagnosis not present

## 2019-01-13 DIAGNOSIS — I10 Essential (primary) hypertension: Secondary | ICD-10-CM | POA: Diagnosis not present

## 2019-01-13 DIAGNOSIS — I8390 Asymptomatic varicose veins of unspecified lower extremity: Secondary | ICD-10-CM | POA: Diagnosis not present

## 2019-01-13 DIAGNOSIS — N816 Rectocele: Secondary | ICD-10-CM | POA: Diagnosis not present

## 2019-01-13 DIAGNOSIS — C9002 Multiple myeloma in relapse: Secondary | ICD-10-CM | POA: Diagnosis not present

## 2019-01-13 DIAGNOSIS — Z1339 Encounter for screening examination for other mental health and behavioral disorders: Secondary | ICD-10-CM | POA: Diagnosis not present

## 2019-01-13 DIAGNOSIS — Z9481 Bone marrow transplant status: Secondary | ICD-10-CM | POA: Diagnosis not present

## 2019-01-13 DIAGNOSIS — Z1331 Encounter for screening for depression: Secondary | ICD-10-CM | POA: Diagnosis not present

## 2019-01-13 DIAGNOSIS — Z0000 Encounter for general adult medical examination without abnormal findings: Secondary | ICD-10-CM | POA: Diagnosis not present

## 2019-01-13 DIAGNOSIS — K219 Gastro-esophageal reflux disease without esophagitis: Secondary | ICD-10-CM | POA: Diagnosis not present

## 2019-01-13 DIAGNOSIS — G4700 Insomnia, unspecified: Secondary | ICD-10-CM | POA: Diagnosis not present

## 2019-01-15 ENCOUNTER — Inpatient Hospital Stay: Payer: Medicare HMO | Attending: Hematology and Oncology

## 2019-01-15 ENCOUNTER — Inpatient Hospital Stay: Payer: Medicare HMO

## 2019-01-15 ENCOUNTER — Inpatient Hospital Stay (HOSPITAL_BASED_OUTPATIENT_CLINIC_OR_DEPARTMENT_OTHER): Payer: Medicare HMO | Admitting: Hematology and Oncology

## 2019-01-15 ENCOUNTER — Other Ambulatory Visit: Payer: Self-pay

## 2019-01-15 VITALS — BP 117/60 | HR 68 | Temp 99.0°F | Resp 18

## 2019-01-15 DIAGNOSIS — Z9221 Personal history of antineoplastic chemotherapy: Secondary | ICD-10-CM | POA: Diagnosis not present

## 2019-01-15 DIAGNOSIS — D469 Myelodysplastic syndrome, unspecified: Secondary | ICD-10-CM

## 2019-01-15 DIAGNOSIS — Z9484 Stem cells transplant status: Secondary | ICD-10-CM | POA: Insufficient documentation

## 2019-01-15 DIAGNOSIS — D61818 Other pancytopenia: Secondary | ICD-10-CM

## 2019-01-15 DIAGNOSIS — C9002 Multiple myeloma in relapse: Secondary | ICD-10-CM

## 2019-01-15 DIAGNOSIS — Z7982 Long term (current) use of aspirin: Secondary | ICD-10-CM | POA: Diagnosis not present

## 2019-01-15 DIAGNOSIS — Z5112 Encounter for antineoplastic immunotherapy: Secondary | ICD-10-CM | POA: Diagnosis not present

## 2019-01-15 DIAGNOSIS — Z79899 Other long term (current) drug therapy: Secondary | ICD-10-CM | POA: Insufficient documentation

## 2019-01-15 DIAGNOSIS — R0602 Shortness of breath: Secondary | ICD-10-CM | POA: Insufficient documentation

## 2019-01-15 DIAGNOSIS — R5381 Other malaise: Secondary | ICD-10-CM

## 2019-01-15 MED ORDER — ONDANSETRON HCL 8 MG PO TABS: 8.0000 mg | ORAL_TABLET | Freq: Three times a day (TID) | ORAL | 1 refills | Status: DC | PRN

## 2019-01-15 MED ORDER — DIPHENHYDRAMINE HCL 25 MG PO CAPS
ORAL_CAPSULE | ORAL | Status: AC
  Filled 2019-01-15: qty 2

## 2019-01-15 MED ORDER — DIPHENHYDRAMINE HCL 25 MG PO CAPS
50.0000 mg | ORAL_CAPSULE | Freq: Once | ORAL | Status: AC
  Administered 2019-01-15: 50 mg via ORAL

## 2019-01-15 MED ORDER — SODIUM CHLORIDE 0.9 % IV SOLN
Freq: Once | INTRAVENOUS | Status: AC
  Administered 2019-01-15: 11:00:00 via INTRAVENOUS
  Filled 2019-01-15: qty 250

## 2019-01-15 MED ORDER — PROCHLORPERAZINE MALEATE 10 MG PO TABS
ORAL_TABLET | ORAL | Status: AC
  Filled 2019-01-15: qty 1

## 2019-01-15 MED ORDER — SODIUM CHLORIDE 0.9 % IV SOLN
16.0000 mg/kg | Freq: Once | INTRAVENOUS | Status: AC
  Administered 2019-01-15: 1200 mg via INTRAVENOUS
  Filled 2019-01-15: qty 60

## 2019-01-15 MED ORDER — DEXAMETHASONE SODIUM PHOSPHATE 10 MG/ML IJ SOLN
10.0000 mg | Freq: Once | INTRAMUSCULAR | Status: AC
  Administered 2019-01-15: 10 mg via INTRAVENOUS

## 2019-01-15 MED ORDER — DEXAMETHASONE SODIUM PHOSPHATE 10 MG/ML IJ SOLN
INTRAMUSCULAR | Status: AC
  Filled 2019-01-15: qty 1

## 2019-01-15 MED ORDER — SODIUM CHLORIDE 0.9% FLUSH
10.0000 mL | INTRAVENOUS | Status: DC | PRN
  Administered 2019-01-15: 10 mL
  Filled 2019-01-15: qty 10

## 2019-01-15 MED ORDER — PROCHLORPERAZINE MALEATE 10 MG PO TABS
10.0000 mg | ORAL_TABLET | Freq: Once | ORAL | Status: AC
  Administered 2019-01-15: 10 mg via ORAL

## 2019-01-15 MED ORDER — HEPARIN SOD (PORK) LOCK FLUSH 100 UNIT/ML IV SOLN
500.0000 [IU] | Freq: Once | INTRAVENOUS | Status: AC | PRN
  Administered 2019-01-15: 500 [IU]
  Filled 2019-01-15: qty 5

## 2019-01-15 MED ORDER — ACETAMINOPHEN 325 MG PO TABS
ORAL_TABLET | ORAL | Status: AC
  Filled 2019-01-15: qty 2

## 2019-01-15 MED ORDER — SODIUM CHLORIDE 0.9% FLUSH
10.0000 mL | Freq: Once | INTRAVENOUS | Status: AC
  Administered 2019-01-15: 10 mL
  Filled 2019-01-15: qty 10

## 2019-01-15 MED ORDER — ACETAMINOPHEN 325 MG PO TABS
650.0000 mg | ORAL_TABLET | Freq: Once | ORAL | Status: AC
  Administered 2019-01-15: 650 mg via ORAL

## 2019-01-15 NOTE — Patient Instructions (Signed)
[facility] Discharge Instructions for Patients Receiving Chemotherapy  Today you received the following chemotherapy agents: Darzalex  To help prevent nausea and vomiting after your treatment, we encourage you to take your nausea medication as directed.   If you develop nausea and vomiting that is not controlled by your nausea medication, call the clinic.   BELOW ARE SYMPTOMS THAT SHOULD BE REPORTED IMMEDIATELY:  *FEVER GREATER THAN 100.5 F  *CHILLS WITH OR WITHOUT FEVER  NAUSEA AND VOMITING THAT IS NOT CONTROLLED WITH YOUR NAUSEA MEDICATION  *UNUSUAL SHORTNESS OF BREATH  *UNUSUAL BRUISING OR BLEEDING  TENDERNESS IN MOUTH AND THROAT WITH OR WITHOUT PRESENCE OF ULCERS  *URINARY PROBLEMS  *BOWEL PROBLEMS  UNUSUAL RASH Items with * indicate a potential emergency and should be followed up as soon as possible.  Feel free to call the clinic should you have any questions or concerns. The clinic phone number is [phone].  Please show the [place] at check-in to the Emergency Department and triage nurse.   Coronavirus (COVID-19) Are you at risk?  Are you at risk for the Coronavirus (COVID-19)?  To be considered HIGH RISK for Coronavirus (COVID-19), you have to meet the following criteria:  . Traveled to [place], [place], [place], [place] or [place]; or in the [place] to [place], [place], [place], or [place]; and have fever, cough, and shortness of breath within the last 2 weeks of travel OR . Been in close contact with a person diagnosed with COVID-19 within the last 2 weeks and have fever, cough, and shortness of breath . IF YOU DO NOT MEET THESE CRITERIA, YOU ARE CONSIDERED LOW RISK FOR COVID-19.  What to do if you are HIGH RISK for COVID-19?  Marland Kitchen If you are having a medical emergency, call 911. . Seek medical care right away. Before you go to a doctor's office, urgent care or emergency department, call ahead and tell them about your  recent travel, contact with someone diagnosed with COVID-19, and your symptoms. You should receive instructions from your physician's office regarding next steps of care.  . When you arrive at healthcare provider, tell the healthcare staff immediately you have returned from visiting [place], [place], [place], [place] or [place]; or traveled in the [place] to [place], [place], [place], or [place]; in the last two weeks or you have been in close contact with a person diagnosed with COVID-19 in the last 2 weeks.   . Tell the health care staff about your symptoms: fever, cough and shortness of breath. . After you have been seen by a medical provider, you will be either: o Tested for (COVID-19) and discharged home on quarantine except to seek medical care if symptoms worsen, and asked to  - Stay home and avoid contact with others until you get your results (4-5 days)  - Avoid travel on public transportation if possible (such as bus, train, or airplane) or o Sent to the Emergency Department by EMS for evaluation, COVID-19 testing, and possible admission depending on your condition and test results.  What to do if you are LOW RISK for COVID-19?  Reduce your risk of any infection by using the same precautions used for avoiding the common cold or flu:  Marland Kitchen Wash your hands often with soap and warm water for at least 20 seconds.  If soap and water are not readily available, use an alcohol-based hand sanitizer with at least 60% alcohol.  . If coughing or  sneezing, cover your mouth and nose by coughing or sneezing into the elbow areas of your shirt or coat, into a tissue or into your sleeve (not your hands). . Avoid shaking hands with others and consider head nods or verbal greetings only. . Avoid touching your eyes, nose, or mouth with unwashed hands.  . Avoid close contact with people who are sick. . Avoid places or events with large numbers of people in one location, like concerts or sporting  events. . Carefully consider travel plans you have or are making. . If you are planning any travel outside or inside the [name], visit the CDC's Travelers' Health webpage for the latest health notices. . If you have some symptoms but not all symptoms, continue to monitor at home and seek medical attention if your symptoms worsen. . If you are having a medical emergency, call 911.   [place] / e-Visit: [email or web address]         [facility]: [facility]: [phone]                   [facility]: [phone]

## 2019-01-15 NOTE — Progress Notes (Signed)
Okay to treat with lab results from 7/20 with neutrophils 0.7. She is currently taking Pomalyst, instructed to hold Pomalyst for 1 week then resume per Dr. Alvy Bimler.

## 2019-01-16 ENCOUNTER — Encounter: Payer: Self-pay | Admitting: Hematology and Oncology

## 2019-01-16 LAB — KAPPA/LAMBDA LIGHT CHAINS
Kappa free light chain: 20.8 mg/L — ABNORMAL HIGH (ref 3.3–19.4)
Kappa, lambda light chain ratio: 1.09 (ref 0.26–1.65)
Lambda free light chains: 19 mg/L (ref 5.7–26.3)

## 2019-01-16 NOTE — Assessment & Plan Note (Signed)
She has intermittent changes in her blood count likely due to side effects of treatment She will continue daratumumab without dose adjustment She will continue reduced dose of pomalidomide

## 2019-01-16 NOTE — Progress Notes (Signed)
[place] OFFICE PROGRESS NOTE  Patient Care Team: [name], [name], MD as PCP - General (Internal Medicine) [name], MD as PCP - Cardiology (Cardiology) [name], MD as Referring Physician (Hematology and Oncology) [name], MD as Consulting Physician (Internal Medicine) [name], [name], MD as Consulting Physician (Infectious Diseases) [name], MD as Consulting Physician (Hematology and Oncology) [name], NP as Nurse Practitioner (Hematology and Oncology)  ASSESSMENT & PLAN:  Multiple myeloma [facility]) The patient has achieved very good partial remission; in fact, she had no detectable M spike recently She has appointment to follow-up at [facility] on August 20 I have received copy of blood work from primary care doctor which show mild neutropenia. She will continue daratumumab along with pomalidomide She has completed tapered off dexamethasone She will continue acyclovir for antimicrobial prophylaxis She will continue aspirin for DVT prophylaxis She will continue calcium with vitamin D and IV bisphosphonate every 3 months;   Pancytopenia, acquired ([place]) She has intermittent changes in her blood count likely due to side effects of treatment She will continue daratumumab without dose adjustment She will continue reduced dose of pomalidomide  Physical deconditioning She has shortness of breath on minimal exertion She has appointment to see cardiologist for further evaluation I suspect shortness of breath could be due to deconditioning   No orders of the defined types were placed in this encounter.   INTERVAL HISTORY: Please see below for problem oriented charting. She returns for further follow-up She denies new bone pain No recent infection, fever or chills She has shortness of breath on minimal exertion but denies chest pain, dizziness or presyncopal episode  SUMMARY OF ONCOLOGIC HISTORY: Oncology  History Overview Note  Multiple myeloma, Ig A Lambda, M spike 3.54 grams, Calcium 9.2, Creatinine 0.8, Beta 2 microglobulin 4.52, IgA 4840 mg/dL, lambda light chain 75.4, albumin 3.6, hemoglobin 9.7, platelet 115    Primary site: Multiple Myeloma   Staging method: AJCC 6th Edition   Clinical: Stage IIA signed by [name], MD on 11/07/2013  2:46 PM   Summary: Stage IIA      Multiple myeloma ([place])  10/31/2013 Bone Marrow Biopsy   Bone marrow biopsy confirmed multiple myeloma with 40% bone marrow involvement. Skeletal survey showed minimal lesions in her score with generalized demineralization   11/10/2013 - 02/13/2014 Chemotherapy   The patient is started on induction chemotherapy with weekly dexamethasone 40 mg by mouth as well as Velcade subcutaneous injection on days 1, 4, 8 and 11. On 11/21/2013, she was started on monthly Zometa.   12/23/2013 Adverse Reaction   The dose of Velcade was reduced due to thrombocytopenia.   01/28/2014 - 04/07/2014 Chemotherapy   Revlimid is added. Treatment was discontinued due to lack of response.   02/24/2014 - 04/07/2014 Chemotherapy   Due to worsening peripheral neuropathy, Velcade injection is changed to once a week. Revlimid was given 21 days on, 7 days off.   04/07/2014 - 04/10/2014 Chemotherapy   Revlimid was discontinued due to lack of response. Chemotherapy was changed back to Velcade injection twice a week, 2 weeks on 1 week off. Her treatment was switched to to minimum response   04/20/2014 - 06/02/2014 Chemotherapy   chemotherapy is switched to Carfilzomib, Cytoxan and dexamethasone.   04/22/2014 Procedure   she has placement of port for chemotherapy.   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 at [facility]   10/20/2014 - 03/24/2015 Chemotherapy    she received chemotherapy with Kyprolis, Revlimid and dexamethasone   10/22/2014 Procedure   She has port placement   01/19/2015 Tumor Marker   IgA lambda M spike at 0.4 g    01/20/2015 Miscellaneous   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 No cource was found and fever resolved with IV vancomycin and meropenem   09/13/2015 Bone Marrow Biopsy   Bone marrow biopsy showed no increased blasts, 3-4 % plasma cells   03/02/2016 Bone Marrow Biopsy   Bone marrow biopsy at [facility] showed normocellular (30%) bone marrow with trilineage hematopoiesis. No significant increase in blasts. No significant increase in plasma cells.   05/12/2016 Imaging   DEXA scan at [place] showed osteopenia   10/24/2016 Imaging   Skeletal survey at [facility], no new lesions   12/07/2017 Imaging   No focal abnormality noted to suggest myeloma. Exam is stable from prior exam.   03/01/2018 Procedure   Successful 8 French right internal jugular vein power port placement with its tip at the SVC/RA junction.   03/06/2018 -  Chemotherapy   The patient had daratumumab    07/09/2018 Bone Marrow Biopsy   Bone marrow biopsy at 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   10/31/2015 Miscellaneous   She has engrafted     REVIEW OF SYSTEMS:   Constitutional: Denies fevers, chills or abnormal weight loss Eyes: Denies blurriness of vision Ears, nose, mouth, throat, and face: Denies mucositis or sore throat Respiratory: Denies cough, dyspnea or wheezes Cardiovascular: Denies palpitation, chest discomfort or lower extremity swelling Gastrointestinal:  Denies nausea, heartburn or change in bowel habits Skin: Denies abnormal skin rashes Lymphatics: Denies new lymphadenopathy or easy bruising Neurological:Denies numbness, tingling or new weaknesses Behavioral/Psych: Mood is stable, no new changes  All other systems were reviewed with the patient and are negative.  I have reviewed the past medical history, past surgical history, social history and family history with the patient and they are unchanged from previous note.  ALLERGIES:  has No Known Allergies.  MEDICATIONS:  Current Outpatient Medications  Medication Sig Dispense Refill  . acyclovir (ZOVIRAX) 400  MG tablet Take 1 tablet (400 mg total) by mouth 2 (two) times daily. 180 tablet 11  . aspirin 81 MG chewable tablet Chew 81 mg by mouth daily. Takes 21 days out of the month with chemo drug    . calcipotriene (DOVONOX) 0.005 % cream daily.     . calcium carbonate (TUMS - DOSED IN MG ELEMENTAL CALCIUM) 500 MG chewable tablet Chew 1 tablet by mouth daily.    . Cholecalciferol (VITAMIN D-1000 MAX ST)  1000 units tablet Take 1,000 Units by mouth daily.    . cyanocobalamin 1000 MCG tablet Take 1,000 mcg by mouth daily.    Marland Kitchen DARATUMUMAB IV Inject into the vein every 30 (thirty) days.    Marland Kitchen docusate sodium (COLACE) 100 MG capsule Take 100 mg by mouth daily as needed for mild constipation.     Marland Kitchen loperamide (IMODIUM) 2 MG capsule Take by mouth as needed for diarrhea or loose stools. As needed only    . metoprolol tartrate (LOPRESSOR) 25 MG tablet Take 1 tablet (25 mg total) by mouth 2 (two) times daily. 180 tablet 3  . mirtazapine (REMERON) 15 MG tablet Take 1 tablet (15 mg total) by mouth at bedtime. 90 tablet 1  . Multiple Vitamin (MULTIVITAMIN WITH MINERALS) TABS tablet Take 1 tablet by mouth daily.    . Multiple Vitamins-Minerals (ICAPS AREDS 2) CAPS Take 1 capsule by mouth 2 (two) times daily.    . ondansetron (ZOFRAN) 8 MG tablet Take 1 tablet (8 mg total) by mouth every 8 (eight) hours as needed (Nausea or vomiting). 30 tablet 1  . pantoprazole (PROTONIX) 40 MG tablet Take 1 tablet (40 mg total) by mouth daily. Take 30-60 minutes before breakfast and dinner 180 tablet 2  . pomalidomide (POMALYST) 2 MG capsule Take 1 capsule (2 mg total) by mouth daily. Take with water on days 1-21. Repeat every 28 days. 21 capsule 0  . prochlorperazine (COMPAZINE) 10 MG tablet Take 1 tablet (10 mg total) by mouth every 6 (six) hours as needed (Nausea or vomiting). 30 tablet 1   No current facility-administered medications for this visit.     PHYSICAL EXAMINATION: ECOG PERFORMANCE STATUS: 1 - Symptomatic but completely ambulatory  Vitals:   01/15/19 0915  BP: 124/67  Pulse: 64  Resp: 17  Temp: 98.7 F (37.1 C)  SpO2: 100%   Filed Weights   01/15/19 0915  Weight: 162 lb 11.2 oz (73.8 kg)    GENERAL:alert, no distress and comfortable SKIN: skin color, texture, turgor are normal, no rashes or significant lesions EYES: normal, Conjunctiva are pink and non-injected, sclera clear OROPHARYNX:no  exudate, no erythema and lips, buccal mucosa, and tongue normal  NECK: supple, thyroid normal size, non-tender, without nodularity LYMPH:  no palpable lymphadenopathy in the cervical, axillary or inguinal LUNGS: clear to auscultation and percussion with normal breathing effort HEART: regular rate & rhythm and no murmurs and no lower extremity edema ABDOMEN:abdomen soft, non-tender and normal bowel sounds Musculoskeletal:no cyanosis of digits and no clubbing  NEURO: alert & oriented x 3 with fluent speech, no focal motor/sensory deficits  LABORATORY DATA:  I have reviewed the data as listed    Component Value Date/Time   NA 141 12/18/2018 0901   NA 142 01/04/2017 0902   K 4.1 12/18/2018 0901   K 3.9 01/04/2017 0902   CL 107 12/18/2018 0901   CO2 24 12/18/2018 0901   CO2 28 01/04/2017 0902   GLUCOSE 90 12/18/2018 0901   GLUCOSE 92  01/04/2017 0902   BUN 14 12/18/2018 0901   BUN 9.5 01/04/2017 0902   CREATININE 0.80 12/18/2018 0901   CREATININE 0.7 01/04/2017 0902   CALCIUM 8.6 (L) 12/18/2018 0901   CALCIUM 9.0 01/04/2017 0902   PROT 6.4 (L) 12/18/2018 0901   PROT 6.0 (L) 01/04/2017 0902   PROT 5.8 (L) 01/04/2017 0902   ALBUMIN 3.7 12/18/2018 0901   ALBUMIN 3.4 (L) 01/04/2017 0902   AST 18 12/18/2018 0901   AST 21 01/04/2017 0902   ALT 14 12/18/2018 0901   ALT 16 01/04/2017 0902   ALKPHOS 74 12/18/2018 0901   ALKPHOS 101 01/04/2017 0902   BILITOT 0.3 12/18/2018 0901   BILITOT 0.56 01/04/2017 0902   GFRNONAA >60 12/18/2018 0901   GFRAA >60 12/18/2018 0901    No results found for: SPEP, UPEP  Lab Results  Component Value Date   WBC 4.0 12/18/2018   NEUTROABS 1.6 (L) 12/18/2018   HGB 13.2 12/18/2018   HCT 40.5 12/18/2018   MCV 109.5 (H) 12/18/2018   PLT 211 12/18/2018      Chemistry      Component Value Date/Time   NA 141 12/18/2018 0901   NA 142 01/04/2017 0902   K 4.1 12/18/2018 0901   K 3.9 01/04/2017 0902   CL 107 12/18/2018 0901   CO2 24 12/18/2018 0901    CO2 28 01/04/2017 0902   BUN 14 12/18/2018 0901   BUN 9.5 01/04/2017 0902   CREATININE 0.80 12/18/2018 0901   CREATININE 0.7 01/04/2017 0902      Component Value Date/Time   CALCIUM 8.6 (L) 12/18/2018 0901   CALCIUM 9.0 01/04/2017 0902   ALKPHOS 74 12/18/2018 0901   ALKPHOS 101 01/04/2017 0902   AST 18 12/18/2018 0901   AST 21 01/04/2017 0902   ALT 14 12/18/2018 0901   ALT 16 01/04/2017 0902   BILITOT 0.3 12/18/2018 0901   BILITOT 0.56 01/04/2017 0902      All questions were answered. The patient knows to call the clinic with any problems, questions or concerns. No barriers to learning was detected.  I spent 15 minutes counseling the patient face to face. The total time spent in the appointment was 20 minutes and more than 50% was on counseling and review of test results  [name], MD 01/16/2019 1:11 PM

## 2019-01-16 NOTE — Assessment & Plan Note (Signed)
She has shortness of breath on minimal exertion She has appointment to see cardiologist for further evaluation I suspect shortness of breath could be due to deconditioning

## 2019-01-16 NOTE — Assessment & Plan Note (Signed)
The patient has achieved very good partial remission; in fact, she had no detectable M spike recently She has appointment to follow-up at [facility] on August 20 I have received copy of blood work from primary care doctor which show mild neutropenia. She will continue daratumumab along with pomalidomide She has completed tapered off dexamethasone She will continue acyclovir for antimicrobial prophylaxis She will continue aspirin for DVT prophylaxis She will continue calcium with vitamin D and IV bisphosphonate every 3 months;

## 2019-01-17 ENCOUNTER — Telehealth: Payer: Self-pay | Admitting: *Deleted

## 2019-01-17 DIAGNOSIS — I3139 Other pericardial effusion (noninflammatory): Secondary | ICD-10-CM

## 2019-01-17 DIAGNOSIS — I313 Pericardial effusion (noninflammatory): Secondary | ICD-10-CM

## 2019-01-17 LAB — MULTIPLE MYELOMA PANEL, SERUM
Albumin SerPl Elph-Mcnc: 3.5 g/dL (ref 2.9–4.4)
Albumin/Glob SerPl: 1.6 (ref 0.7–1.7)
Alpha 1: 0.2 g/dL (ref 0.0–0.4)
Alpha2 Glob SerPl Elph-Mcnc: 0.8 g/dL (ref 0.4–1.0)
B-Globulin SerPl Elph-Mcnc: 0.7 g/dL (ref 0.7–1.3)
Gamma Glob SerPl Elph-Mcnc: 0.5 g/dL (ref 0.4–1.8)
Globulin, Total: 2.2 g/dL (ref 2.2–3.9)
IgA: 180 mg/dL (ref 64–422)
IgG (Immunoglobin G), Serum: 476 mg/dL — ABNORMAL LOW (ref 586–1602)
IgM (Immunoglobulin M), Srm: 58 mg/dL (ref 26–217)
M Protein SerPl Elph-Mcnc: 0.1 g/dL — ABNORMAL HIGH
Total Protein ELP: 5.7 g/dL — ABNORMAL LOW (ref 6.0–8.5)

## 2019-01-17 NOTE — Telephone Encounter (Signed)
-----   Message from Rise Mu, [place] sent at 01/17/2019 12:24 PM EDT ----- Can we get her scheduled for follow up echo prior to her appointment with me on 9/1? Dx: pericardial effusion. Thanks!

## 2019-01-17 NOTE — Telephone Encounter (Signed)
Order placed for echo. To scheduling to arrange prior to follow up on 02/25/19.

## 2019-01-17 NOTE — Telephone Encounter (Signed)
Scheduled 8/28

## 2019-01-20 ENCOUNTER — Telehealth: Payer: Self-pay | Admitting: *Deleted

## 2019-01-20 NOTE — Telephone Encounter (Signed)
Telephone call to patient and advised lab results as directed below.

## 2019-01-20 NOTE — Telephone Encounter (Signed)
-----   Message from [name], MD sent at 01/20/2019  8:05 AM EDT ----- Regarding: myeloma panels Pls let her know results of myeloma panel and light chain studies are stable We can print and mail it to her or she can wait until her next visit for a copy

## 2019-01-30 ENCOUNTER — Other Ambulatory Visit: Payer: Self-pay | Admitting: *Deleted

## 2019-01-30 DIAGNOSIS — C9002 Multiple myeloma in relapse: Secondary | ICD-10-CM

## 2019-01-30 MED ORDER — POMALIDOMIDE 2 MG PO CAPS: 2.0000 mg | ORAL_CAPSULE | Freq: Every day | ORAL | 0 refills | Status: DC

## 2019-02-03 DIAGNOSIS — Z9481 Bone marrow transplant status: Secondary | ICD-10-CM | POA: Diagnosis not present

## 2019-02-03 DIAGNOSIS — C9000 Multiple myeloma not having achieved remission: Secondary | ICD-10-CM | POA: Diagnosis not present

## 2019-02-03 DIAGNOSIS — D469 Myelodysplastic syndrome, unspecified: Secondary | ICD-10-CM | POA: Diagnosis not present

## 2019-02-11 ENCOUNTER — Encounter: Payer: Self-pay | Admitting: Hematology and Oncology

## 2019-02-12 ENCOUNTER — Inpatient Hospital Stay: Payer: Medicare HMO | Attending: Hematology and Oncology | Admitting: Hematology and Oncology

## 2019-02-12 ENCOUNTER — Inpatient Hospital Stay: Payer: Medicare HMO

## 2019-02-12 ENCOUNTER — Other Ambulatory Visit: Payer: Self-pay

## 2019-02-12 VITALS — BP 119/67 | HR 68 | Temp 98.9°F | Resp 16

## 2019-02-12 DIAGNOSIS — Z79899 Other long term (current) drug therapy: Secondary | ICD-10-CM | POA: Insufficient documentation

## 2019-02-12 DIAGNOSIS — C9002 Multiple myeloma in relapse: Secondary | ICD-10-CM | POA: Insufficient documentation

## 2019-02-12 DIAGNOSIS — Z5112 Encounter for antineoplastic immunotherapy: Secondary | ICD-10-CM | POA: Diagnosis present

## 2019-02-12 DIAGNOSIS — D469 Myelodysplastic syndrome, unspecified: Secondary | ICD-10-CM | POA: Insufficient documentation

## 2019-02-12 DIAGNOSIS — C9000 Multiple myeloma not having achieved remission: Secondary | ICD-10-CM

## 2019-02-12 DIAGNOSIS — M8580 Other specified disorders of bone density and structure, unspecified site: Secondary | ICD-10-CM | POA: Insufficient documentation

## 2019-02-12 DIAGNOSIS — R0602 Shortness of breath: Secondary | ICD-10-CM | POA: Diagnosis not present

## 2019-02-12 DIAGNOSIS — Z7982 Long term (current) use of aspirin: Secondary | ICD-10-CM | POA: Insufficient documentation

## 2019-02-12 DIAGNOSIS — Z9221 Personal history of antineoplastic chemotherapy: Secondary | ICD-10-CM | POA: Insufficient documentation

## 2019-02-12 MED ORDER — DEXAMETHASONE SODIUM PHOSPHATE 10 MG/ML IJ SOLN
INTRAMUSCULAR | Status: AC
  Filled 2019-02-12: qty 1

## 2019-02-12 MED ORDER — SODIUM CHLORIDE 0.9 % IV SOLN
Freq: Once | INTRAVENOUS | Status: AC
  Administered 2019-02-12: 10:00:00 via INTRAVENOUS
  Filled 2019-02-12: qty 250

## 2019-02-12 MED ORDER — SODIUM CHLORIDE 0.9% FLUSH
10.0000 mL | INTRAVENOUS | Status: DC | PRN
  Administered 2019-02-12: 10 mL
  Filled 2019-02-12: qty 10

## 2019-02-12 MED ORDER — PROCHLORPERAZINE MALEATE 10 MG PO TABS
ORAL_TABLET | ORAL | Status: AC
  Filled 2019-02-12: qty 1

## 2019-02-12 MED ORDER — DEXAMETHASONE SODIUM PHOSPHATE 10 MG/ML IJ SOLN
10.0000 mg | Freq: Once | INTRAMUSCULAR | Status: AC
  Administered 2019-02-12: 10 mg via INTRAVENOUS

## 2019-02-12 MED ORDER — PROCHLORPERAZINE MALEATE 10 MG PO TABS
10.0000 mg | ORAL_TABLET | Freq: Once | ORAL | Status: AC
  Administered 2019-02-12: 10 mg via ORAL

## 2019-02-12 MED ORDER — DIPHENHYDRAMINE HCL 25 MG PO CAPS
ORAL_CAPSULE | ORAL | Status: AC
  Filled 2019-02-12: qty 2

## 2019-02-12 MED ORDER — DIPHENHYDRAMINE HCL 25 MG PO CAPS
50.0000 mg | ORAL_CAPSULE | Freq: Once | ORAL | Status: AC
  Administered 2019-02-12: 50 mg via ORAL

## 2019-02-12 MED ORDER — HEPARIN SOD (PORK) LOCK FLUSH 100 UNIT/ML IV SOLN
500.0000 [IU] | Freq: Once | INTRAVENOUS | Status: AC | PRN
  Administered 2019-02-12: 500 [IU]
  Filled 2019-02-12: qty 5

## 2019-02-12 MED ORDER — SODIUM CHLORIDE 0.9 % IV SOLN
16.0000 mg/kg | Freq: Once | INTRAVENOUS | Status: AC
  Administered 2019-02-12: 1200 mg via INTRAVENOUS
  Filled 2019-02-12: qty 60

## 2019-02-12 MED ORDER — ACETAMINOPHEN 325 MG PO TABS
650.0000 mg | ORAL_TABLET | Freq: Once | ORAL | Status: AC
  Administered 2019-02-12: 650 mg via ORAL

## 2019-02-12 MED ORDER — ACETAMINOPHEN 325 MG PO TABS
ORAL_TABLET | ORAL | Status: AC
  Filled 2019-02-12: qty 2

## 2019-02-12 NOTE — Progress Notes (Signed)
Patient had labs drawn at [facility] recently- results available in [place]. Per Dr. Alvy Bimler ok to proceed with treatment today using those labs which where within limits.

## 2019-02-12 NOTE — Patient Instructions (Signed)
[place] Discharge Instructions for Patients Receiving Chemotherapy  Today you received the following chemotherapy agents: Darzalex  To help prevent nausea and vomiting after your treatment, we encourage you to take your nausea medication as directed.   If you develop nausea and vomiting that is not controlled by your nausea medication, call the clinic.   BELOW ARE SYMPTOMS THAT SHOULD BE REPORTED IMMEDIATELY:  *FEVER GREATER THAN 100.5 F  *CHILLS WITH OR WITHOUT FEVER  NAUSEA AND VOMITING THAT IS NOT CONTROLLED WITH YOUR NAUSEA MEDICATION  *UNUSUAL SHORTNESS OF BREATH  *UNUSUAL BRUISING OR BLEEDING  TENDERNESS IN MOUTH AND THROAT WITH OR WITHOUT PRESENCE OF ULCERS  *URINARY PROBLEMS  *BOWEL PROBLEMS  UNUSUAL RASH Items with * indicate a potential emergency and should be followed up as soon as possible.  Feel free to call the clinic should you have any questions or concerns. The clinic phone number is [phone].  Please show the [place] at check-in to the Emergency Department and triage nurse.   Coronavirus (COVID-19) Are you at risk?  Are you at risk for the Coronavirus (COVID-19)?  To be considered HIGH RISK for Coronavirus (COVID-19), you have to meet the following criteria:  . Traveled to [place], [place], [place], [place] or [place]; or in the [place] to [place], [place], [place], or [place]; and have fever, cough, and shortness of breath within the last 2 weeks of travel OR . Been in close contact with a person diagnosed with COVID-19 within the last 2 weeks and have fever, cough, and shortness of breath . IF YOU DO NOT MEET THESE CRITERIA, YOU ARE CONSIDERED LOW RISK FOR COVID-19.  What to do if you are HIGH RISK for COVID-19?  Marland Kitchen If you are having a medical emergency, call 911. . Seek medical care right away. Before you go to a doctor's office, urgent care or emergency department, call ahead and tell them about your  recent travel, contact with someone diagnosed with COVID-19, and your symptoms. You should receive instructions from your physician's office regarding next steps of care.  . When you arrive at healthcare provider, tell the healthcare staff immediately you have returned from visiting [place], [place], [place], [place] or [place]; or traveled in the [place] to [place], [place], [place], or [place]; in the last two weeks or you have been in close contact with a person diagnosed with COVID-19 in the last 2 weeks.   . Tell the health care staff about your symptoms: fever, cough and shortness of breath. . After you have been seen by a medical provider, you will be either: o Tested for (COVID-19) and discharged home on quarantine except to seek medical care if symptoms worsen, and asked to  - Stay home and avoid contact with others until you get your results (4-5 days)  - Avoid travel on public transportation if possible (such as bus, train, or airplane) or o Sent to the Emergency Department by EMS for evaluation, COVID-19 testing, and possible admission depending on your condition and test results.  What to do if you are LOW RISK for COVID-19?  Reduce your risk of any infection by using the same precautions used for avoiding the common cold or flu:  Marland Kitchen Wash your hands often with soap and warm water for at least 20 seconds.  If soap and water are not readily available, use an alcohol-based hand sanitizer with at least 60% alcohol.  . If coughing or  sneezing, cover your mouth and nose by coughing or sneezing into the elbow areas of your shirt or coat, into a tissue or into your sleeve (not your hands). . Avoid shaking hands with others and consider head nods or verbal greetings only. . Avoid touching your eyes, nose, or mouth with unwashed hands.  . Avoid close contact with people who are sick. . Avoid places or events with large numbers of people in one location, like concerts or sporting  events. . Carefully consider travel plans you have or are making. . If you are planning any travel outside or inside the [name], visit the CDC's Travelers' Health webpage for the latest health notices. . If you have some symptoms but not all symptoms, continue to monitor at home and seek medical attention if your symptoms worsen. . If you are having a medical emergency, call 911.   [place] / e-Visit: [email or web address]         [facility]: [facility]: [phone]                   [facility]: [phone]

## 2019-02-13 DIAGNOSIS — D469 Myelodysplastic syndrome, unspecified: Secondary | ICD-10-CM | POA: Diagnosis not present

## 2019-02-13 DIAGNOSIS — C9000 Multiple myeloma not having achieved remission: Secondary | ICD-10-CM | POA: Diagnosis not present

## 2019-02-14 ENCOUNTER — Encounter: Payer: Self-pay | Admitting: Hematology and Oncology

## 2019-02-14 DIAGNOSIS — R0602 Shortness of breath: Secondary | ICD-10-CM | POA: Insufficient documentation

## 2019-02-14 NOTE — Assessment & Plan Note (Signed)
The patient has achieved very good partial remission with intermittent detectable M spike She has appointment to follow-up at [facility]  She will continue daratumumab along with pomalidomide She has completed tapered off dexamethasone She will continue acyclovir for antimicrobial prophylaxis She will continue aspirin for DVT prophylaxis She will continue calcium with vitamin D and IV bisphosphonate every 3 months;

## 2019-02-14 NOTE — Assessment & Plan Note (Addendum)
She is fully engrafted with no signs of pancytopenia

## 2019-02-14 NOTE — Assessment & Plan Note (Signed)
She has cardiology appointment pending She will continue aspirin therapy

## 2019-02-14 NOTE — Progress Notes (Signed)
[place] OFFICE PROGRESS NOTE  Patient Care Team: [name], [name], MD as PCP - General (Internal Medicine) [name], MD as PCP - Cardiology (Cardiology) [name], MD as Referring Physician (Hematology and Oncology) [name], MD as Consulting Physician (Internal Medicine) [name], [name], MD as Consulting Physician (Infectious Diseases) [name], MD as Consulting Physician (Hematology and Oncology) [name], NP as Nurse Practitioner (Hematology and Oncology)  ASSESSMENT & PLAN:  Multiple myeloma [facility]) The patient has achieved very good partial remission with intermittent detectable M spike She has appointment to follow-up at [facility]  She will continue daratumumab along with pomalidomide She has completed tapered off dexamethasone She will continue acyclovir for antimicrobial prophylaxis She will continue aspirin for DVT prophylaxis She will continue calcium with vitamin D and IV bisphosphonate every 3 months;   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   INTERVAL HISTORY: Please see below for problem oriented charting. She returns for further follow-up Denies recent infusion reaction No recent infection, fever or chills She has mild shortness of breath on exertion although she can still walk 2 to 3 miles per day  SUMMARY OF ONCOLOGIC HISTORY: Oncology History Overview Note  Multiple myeloma, Ig A Lambda, M spike 3.54 grams, Calcium 9.2, Creatinine 0.8, Beta 2 microglobulin 4.52, IgA 4840 mg/dL, lambda light chain 75.4, albumin 3.6, hemoglobin 9.7, platelet 115    Primary site: Multiple Myeloma   Staging method: AJCC 6th Edition   Clinical: Stage IIA signed  by [name], MD on 11/07/2013  2:46 PM   Summary: Stage IIA      Multiple myeloma ([place])  10/31/2013 Bone Marrow Biopsy   Bone marrow biopsy confirmed multiple myeloma with 40% bone marrow involvement. Skeletal survey showed minimal lesions in her score with generalized demineralization   11/10/2013 - 02/13/2014 Chemotherapy   The patient is started on induction chemotherapy with weekly dexamethasone 40 mg by mouth as well as Velcade subcutaneous injection on days 1, 4, 8 and 11. On 11/21/2013, she was started on monthly Zometa.   12/23/2013 Adverse Reaction   The dose of Velcade was reduced due to thrombocytopenia.   01/28/2014 - 04/07/2014 Chemotherapy   Revlimid is added. Treatment was discontinued due to lack of response.   02/24/2014 - 04/07/2014 Chemotherapy   Due to worsening peripheral neuropathy, Velcade injection is changed to once a week. Revlimid was given 21 days on, 7 days off.   04/07/2014 - 04/10/2014 Chemotherapy   Revlimid was discontinued due to lack of response. Chemotherapy was changed back to Velcade injection twice a week, 2 weeks on 1 week off. Her treatment was switched to to minimum response   04/20/2014 - 06/02/2014 Chemotherapy   chemotherapy is switched to Carfilzomib, Cytoxan and dexamethasone.   04/22/2014 Procedure   she has placement of port for chemotherapy.   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 [facility]   10/20/2014 - 03/24/2015 Chemotherapy    she received chemotherapy with Kyprolis, Revlimid and dexamethasone   10/22/2014 Procedure   She has port placement    01/19/2015 Tumor Marker   IgA lambda M spike at 0.4 g    01/20/2015 Miscellaneous   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 No cource was found and fever resolved with IV vancomycin and meropenem   09/13/2015 Bone Marrow Biopsy   Bone marrow biopsy showed no increased blasts, 3-4 % plasma cells   03/02/2016 Bone Marrow Biopsy   Bone marrow biopsy at [facility] showed normocellular (30%) bone marrow with trilineage hematopoiesis. No significant increase in blasts. No significant increase in plasma cells.   05/12/2016 Imaging   DEXA scan at [place] showed osteopenia   10/24/2016 Imaging   Skeletal survey at [facility], no new lesions   12/07/2017 Imaging   No focal abnormality noted to suggest myeloma. Exam is stable from prior exam.   03/01/2018 Procedure   Successful 8 French right internal jugular vein power port placement with its tip at the SVC/RA junction.   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   10/31/2015 Miscellaneous   She has engrafted     REVIEW OF SYSTEMS:   Constitutional: Denies fevers, chills or abnormal weight loss Eyes: Denies blurriness of vision Ears, nose, mouth, throat, and face: Denies mucositis or sore throat Respiratory: Denies cough, dyspnea or wheezes Cardiovascular: Denies palpitation, chest discomfort or lower extremity swelling Gastrointestinal:  Denies nausea, heartburn or change in bowel habits Skin: Denies abnormal skin rashes Lymphatics: Denies new lymphadenopathy or easy bruising Neurological:Denies numbness, tingling or new weaknesses Behavioral/Psych: Mood is stable, no new changes  All other systems were reviewed with the patient and are negative.  I have reviewed the past medical history, past surgical history, social history and family history with the patient and they are unchanged from previous note.  ALLERGIES:  has No Known Allergies.  MEDICATIONS:  Current Outpatient Medications  Medication Sig Dispense Refill  . acyclovir (ZOVIRAX) 400 MG tablet Take 1 tablet (400 mg total) by mouth 2 (two) times daily. 180 tablet 11  . aspirin 81 MG chewable tablet Chew 81 mg by mouth daily. Takes 21 days out of the month with chemo drug    . calcipotriene (DOVONOX) 0.005 % cream daily.     Marland Kitchen  calcium carbonate (TUMS - DOSED IN MG ELEMENTAL CALCIUM) 500 MG chewable tablet Chew 1 tablet by mouth daily.    . Cholecalciferol (VITAMIN D-1000 MAX ST) 1000 units tablet Take 1,000 Units by mouth daily.    . cyanocobalamin 1000 MCG tablet Take 1,000 mcg by mouth daily.    Marland Kitchen DARATUMUMAB IV Inject into the vein every 30 (thirty) days.    Marland Kitchen docusate sodium (COLACE) 100 MG capsule Take 100 mg by mouth daily as needed for mild constipation.     Marland Kitchen loperamide  (IMODIUM) 2 MG capsule Take by mouth as needed for diarrhea or loose stools. As needed only    . metoprolol tartrate (LOPRESSOR) 25 MG tablet Take 1 tablet (25 mg total) by mouth 2 (two) times daily. 180 tablet 3  . mirtazapine (REMERON) 15 MG tablet Take 1 tablet (15 mg total) by mouth at bedtime. 90 tablet 1  . Multiple Vitamin (MULTIVITAMIN WITH MINERALS) TABS tablet Take 1 tablet by mouth daily.    . Multiple Vitamins-Minerals (ICAPS AREDS 2) CAPS Take 1 capsule by mouth 2 (two) times daily.    . ondansetron (ZOFRAN) 8 MG tablet Take 1 tablet (8 mg total) by mouth every 8 (eight) hours as needed (Nausea or vomiting). 30 tablet 1  . pantoprazole (PROTONIX) 40 MG tablet Take 1 tablet (40 mg total) by mouth daily. Take 30-60 minutes before breakfast and dinner 180 tablet 2  . pomalidomide (POMALYST) 2 MG capsule Take 1 capsule (2 mg total) by mouth daily. Take with water on days 1-21. Repeat every 28 days. 21 capsule 0  . prochlorperazine (COMPAZINE) 10 MG tablet Take 1 tablet (10 mg total) by mouth every 6 (six) hours as needed (Nausea or vomiting). 30 tablet 1   No current facility-administered medications for this visit.     PHYSICAL EXAMINATION: ECOG PERFORMANCE STATUS: 1 - Symptomatic but completely ambulatory  Vitals:   02/12/19 0845  BP: 120/79  Pulse: 64  Resp: 18  Temp: 98.5 F (36.9 C)  SpO2: 99%   Filed Weights   02/12/19 0845  Weight: 163 lb 3.2 oz (74 kg)    GENERAL:alert, no distress and comfortable SKIN: skin color, texture, turgor are normal, no rashes or significant lesions EYES: normal, Conjunctiva are pink and non-injected, sclera clear OROPHARYNX:no exudate, no erythema and lips, buccal mucosa, and tongue normal  NECK: supple, thyroid normal size, non-tender, without nodularity LYMPH:  no palpable lymphadenopathy in the cervical, axillary or inguinal LUNGS: clear to auscultation and percussion with normal breathing effort HEART: regular rate & rhythm and no  murmurs and no lower extremity edema ABDOMEN:abdomen soft, non-tender and normal bowel sounds Musculoskeletal:no cyanosis of digits and no clubbing  NEURO: alert & oriented x 3 with fluent speech, no focal motor/sensory deficits  LABORATORY DATA:  I have reviewed the data as listed    Component Value Date/Time   NA 141 12/18/2018 0901   NA 142 01/04/2017 0902   K 4.1 12/18/2018 0901   K 3.9 01/04/2017 0902   CL 107 12/18/2018 0901   CO2 24 12/18/2018 0901   CO2 28 01/04/2017 0902   GLUCOSE 90 12/18/2018 0901   GLUCOSE 92 01/04/2017 0902   BUN 14 12/18/2018 0901   BUN 9.5 01/04/2017 0902   CREATININE 0.80 12/18/2018 0901   CREATININE 0.7 01/04/2017 0902   CALCIUM 8.6 (L) 12/18/2018 0901   CALCIUM 9.0 01/04/2017 0902   PROT 6.4 (L) 12/18/2018 0901   PROT 6.0 (L) 01/04/2017 7048  PROT 5.8 (L) 01/04/2017 0902   ALBUMIN 3.7 12/18/2018 0901   ALBUMIN 3.4 (L) 01/04/2017 0902   AST 18 12/18/2018 0901   AST 21 01/04/2017 0902   ALT 14 12/18/2018 0901   ALT 16 01/04/2017 0902   ALKPHOS 74 12/18/2018 0901   ALKPHOS 101 01/04/2017 0902   BILITOT 0.3 12/18/2018 0901   BILITOT 0.56 01/04/2017 0902   GFRNONAA >60 12/18/2018 0901   GFRAA >60 12/18/2018 0901    No results found for: SPEP, UPEP  Lab Results  Component Value Date   WBC 4.0 12/18/2018   NEUTROABS 1.6 (L) 12/18/2018   HGB 13.2 12/18/2018   HCT 40.5 12/18/2018   MCV 109.5 (H) 12/18/2018   PLT 211 12/18/2018      Chemistry      Component Value Date/Time   NA 141 12/18/2018 0901   NA 142 01/04/2017 0902   K 4.1 12/18/2018 0901   K 3.9 01/04/2017 0902   CL 107 12/18/2018 0901   CO2 24 12/18/2018 0901   CO2 28 01/04/2017 0902   BUN 14 12/18/2018 0901   BUN 9.5 01/04/2017 0902   CREATININE 0.80 12/18/2018 0901   CREATININE 0.7 01/04/2017 0902      Component Value Date/Time   CALCIUM 8.6 (L) 12/18/2018 0901   CALCIUM 9.0 01/04/2017 0902   ALKPHOS 74 12/18/2018 0901   ALKPHOS 101 01/04/2017 0902   AST 18  12/18/2018 0901   AST 21 01/04/2017 0902   ALT 14 12/18/2018 0901   ALT 16 01/04/2017 0902   BILITOT 0.3 12/18/2018 0901   BILITOT 0.56 01/04/2017 0902       All questions were answered. The patient knows to call the clinic with any problems, questions or concerns. No barriers to learning was detected.  I spent 15 minutes counseling the patient face to face. The total time spent in the appointment was 20 minutes and more than 50% was on counseling and review of test results  [name], MD 02/14/2019 8:38 AM

## 2019-02-17 ENCOUNTER — Telehealth: Payer: Self-pay | Admitting: Cardiovascular Disease

## 2019-02-17 NOTE — Telephone Encounter (Signed)
Pt c/o BP issue: STAT if pt c/o blurred vision, one-sided weakness or slurred speech  1. What are your last 5 BP readings? 175/99 133/79  2. Are you having any other symptoms (ex. Dizziness, headache, blurred vision, passed out)? Extreme tiredness, HR has been unusually high ~90s with little movement  3. What is your BP issue? Patient feels vitals have been high.  Would like to discuss with nurse.

## 2019-02-17 NOTE — Telephone Encounter (Signed)
I spoke with the patient. She states she has been having increased fatigue Friday/ Saturday/ Sunday since her last treatment on Thursday last week.   She states she her BP today has been: 6:30 am- 175/95 7:30 am- 133/79  HR's have been up as high as 90 bpm. She has been sitting for 1.5 hours and her HR is 75 bpm now.   She confirms she does not consistently check her BP every day. She has been walking ~ 2 miles/ day since March and doing well with that. She will get slightly winded with walking on a incline, but this is not new. She has a history of pericardial effusion. She is due for a repeat echo on 8/28 today and follow up with Christell Faith, PA on 9/1.  She confirms she is taking metoprolol tartrate 25 mg BID. She took this at 8:00 am today.   I have advised the patient to please continue to monitor her BP/ HR this week and bring readings with her on Friday when she comes for her echo.  I have advised her to call sooner if she notices her BP/ HR's are consistently high. She does have a history of SVT on her most recent heart monitor.  The patient voices understanding and is agreeable.

## 2019-02-19 ENCOUNTER — Telehealth: Payer: Self-pay

## 2019-02-19 ENCOUNTER — Encounter: Payer: Self-pay | Admitting: Hematology and Oncology

## 2019-02-19 ENCOUNTER — Telehealth: Payer: Self-pay | Admitting: Hematology and Oncology

## 2019-02-19 NOTE — Telephone Encounter (Signed)
-----   Message from [name], MD sent at 02/19/2019 11:28 AM EDT ----- I sent scheduling msg ----- Message ----- From: Paulla Dolly, RN Sent: 02/19/2019   9:43 AM EDT To: [name], MD  Pt left MyChart msg stating no one has called to schedule her SEptember appts. What does she need to be scheduled for?

## 2019-02-19 NOTE — Telephone Encounter (Signed)
I spoke with pt by phone.  Told her to anticipate a call from scheduling dept within next couple of days and to call us and let us know if she has not heard from anyone by Monday 8/31. Pt verbalizes understanding

## 2019-02-19 NOTE — Telephone Encounter (Signed)
I talk with patient regarding schedule  

## 2019-02-21 ENCOUNTER — Ambulatory Visit (INDEPENDENT_AMBULATORY_CARE_PROVIDER_SITE_OTHER): Payer: Medicare HMO

## 2019-02-21 ENCOUNTER — Other Ambulatory Visit: Payer: Self-pay

## 2019-02-21 DIAGNOSIS — I313 Pericardial effusion (noninflammatory): Secondary | ICD-10-CM | POA: Diagnosis not present

## 2019-02-21 DIAGNOSIS — I3139 Other pericardial effusion (noninflammatory): Secondary | ICD-10-CM

## 2019-02-23 NOTE — Progress Notes (Signed)
Cardiology Office Note    Date:  02/25/2019   ID:  Jean Davidson, DOB Sep 06, 1945, MRN [id number]  PCP:  Haywood Pao, MD  Cardiologist:  Kathlyn Sacramento, MD  Electrophysiologist:  None   Chief Complaint: Follow up  History of Present Illness:   Jean Davidson is a 73 y.o. female with history of paroxysmal SVT, multiple myeloma s/p bone marrow transplant in 2016, MDS s/p bone marrow transplant in 06/2018, pericardial effusion without intervention, HTN, palpitations, anemia, and cervical spine stenosis who presents forfollow up of recent echo as outlined below.   She was previously seen by Dr. Fletcher Anon in 2016 for palpitations with Holter monitor showing NSR with one PVC as well as several PACs. Echo was overall normal as was a treadmill stress test. In the setting of hypertension, she was started on Coreg. She has known multiple myeloma with relapsing disease noted in 2016. She is followed by [facility]. At some point, she developed a large pericardial effusion and was seen by [facility] cardiology with initial plan of pericardiocentesis, though the effusion decreased in size without intervention. Echo in 2018 showed a small pericardial effusion. She was seen in our office in 10/2017 and was doing well.EKG at that time did not show low voltage.  She called on 11/01/2018 noting oncology nurse questioning if the patient was in Afib with reported recheck indicating no Afib (details are unclear). She also noted variable BP with readings ranging from 197/106 to 123/97. Her Coreg was increased to 6.25 mg bid. She was seen in telemedicine follow up on 11/05/2018 and was doing well from a cardiac perspective. She noted increased anxiety with regards to COVID-19 and social distancing. In this setting, she noted her BP had been running high. Her Coreg was titrated to 12.5 mg bid. Zio showed a predominant rhythm of sinus with an average heart rate of 71 bpm (range 51 (2:47 AM) -167 bpm), 34 SVT runs occurred with the  fastest interval lasting 4 beats with a maximum rate of 167 bpm and the longest interval lasting 15 beats with an average heart rate of 136 bpm, isolated PACs, atrial couplet and triplets were rare, isolated PVCs and ventricular triplets were rare. There were no patient triggered events.  Given her history of pericardial effusion she underwent repeat echo in 01/2019 which showed an EF of 55 to 60%, normal LV cavity size, normal LV diastolic function, normal RV systolic function, normal RV cavity size, mildly elevated PASP of 38.3 mmHg, mildly dilated left atrium, mild mitral regurgitation, mild tricuspid regurgitation, trivial pericardial effusion.  Patient contacted our office in late 01/2019 with increased fatigue following her most recent multiple myeloma treatment.  BP was ranging from the 638G to 665L systolic with heart rate in the 70s to 90s bpm.  She indicated she was walking approximately 2 miles per day and was doing well with that.  Patient comes in doing reasonably well from a cardiac perspective.  He does report an episode occurring 1 night in which she was woken up with palpitations with her Fitbit telling her her heart rate was in the 140s bpm.  She took 1 metoprolol with resolution and palpitations.  Since then, she has been asymptomatic.  She denies any chest pain, exertional shortness of breath, dizziness, presyncope, or syncope.  He does continue to note some mild shortness of breath when walking up an incline which is unchanged throughout her entire life.  She attributes this to when she is walking sometimes or  even brushing her teeth she forgets to breathe.  She is focusing more on breathing with improvement in symptoms.  She does have mild intermittent lower extremity swelling and does try to elevate her legs when sitting.  She also has compression stockings that she wears intermittently.  She has tolerated the transition from carvedilol to metoprolol without issue.  Of note, when reviewing  patient's medications, she indicates she does not take aspirin 81 rather aspirin 325 mg only when taking Pomalyst per oncology.  She continued to deal with dysphagia associated with a hiatal hernia and is followed by GI for this.   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   aspirin 81 MG chewable tablet Chew 81 mg by mouth daily. Takes 21 days out of the month with chemo drug   calcipotriene (DOVONOX) 0.005 % cream daily.    calcium carbonate (TUMS - DOSED IN MG ELEMENTAL CALCIUM) 500 MG chewable tablet Chew 1 tablet by mouth daily.   Cholecalciferol (VITAMIN D-1000 MAX ST) 1000 units  tablet Take 1,000 Units by mouth daily.   cyanocobalamin 1000 MCG tablet Take 1,000 mcg by mouth daily.   DARATUMUMAB IV Inject into the vein every 30 (thirty) days.   docusate sodium (COLACE) 100 MG capsule Take 100 mg by mouth daily as needed for mild constipation.    loperamide (IMODIUM) 2 MG capsule Take by mouth as needed for diarrhea or loose stools. As needed only   metoprolol tartrate (LOPRESSOR) 25 MG tablet Take 1 tablet (25 mg total) by mouth 2 (two) times daily.   mirtazapine (REMERON) 15 MG tablet Take 1 tablet (15 mg total) by mouth at bedtime.   Multiple Vitamin (MULTIVITAMIN WITH MINERALS) TABS tablet Take 1 tablet by mouth daily.   ondansetron (ZOFRAN) 8 MG tablet Take 1 tablet (8 mg total) by mouth every 8 (eight) hours as needed (Nausea or vomiting).   pantoprazole (PROTONIX) 40 MG tablet Take 1 tablet (40 mg total) by mouth daily.  Take 30-60 minutes before breakfast and dinner   pomalidomide (POMALYST) 2 MG capsule Take 1 capsule (2 mg total) by mouth daily. Take with water on days 1-21. Repeat every 28 days.   prochlorperazine (COMPAZINE) 10 MG tablet Take 1 tablet (10 mg total) by mouth every 6 (six) hours as needed (Nausea or vomiting).     Allergies:   Patient has no known allergies.   Social History   Socioeconomic History   Marital status: Divorced    Spouse name: Not on file   Number of children: Not on file   Years of education: Not on file   Highest education level: Not on file  Occupational History   Not on file  Social Needs   Financial resource strain: Not on file   Food insecurity    Worry: Not on file    Inability: Not on file   Transportation needs    Medical: Not on file    Non-medical: Not on file  Tobacco Use   Smoking status: Former Smoker    Packs/day: 0.50    Years: 4.00    Pack years: 2.00    Quit date: 06/26/1968    Years since quitting: 50.7   Smokeless tobacco: Never Used  Substance and Sexual Activity    Alcohol use: Yes    Alcohol/week: 1.0 standard drinks    Types: 1 Cans of beer per week    Comment: 1 per week per pt   Drug use: No   Sexual activity: Not on file  Lifestyle   Physical activity    Days per week: Not on file    Minutes per session: Not on file   Stress: Not on file  Relationships   Social connections    Talks on phone: Not on file    Gets together: Not on file    Attends religious service: Not on file    Active member of club or organization: Not on file    Attends meetings of clubs or organizations: Not on file    Relationship status: Not on file  Other Topics Concern   Not on file  Social History Narrative   Chauncey Reading is daughter santresa, levett will,  full code ( reviewed 33)     Family History:  The patient's family history includes Colon cancer (age of onset: 59) in her paternal aunt; Diabetes in her father; Heart disease in her brother, father, and mother; Heart failure in her father; Hypertension in her brother; Kidney disease in her father; Skin cancer in her brother and father; Throat cancer in her father. There is no history of Stomach cancer.  ROS:   Review of Systems  Constitutional: Positive for malaise/fatigue. Negative for chills, diaphoresis, fever and weight loss.  HENT: Negative for congestion.   Eyes: Negative for discharge and redness.  Respiratory: Negative for cough, hemoptysis, sputum production, shortness of breath and wheezing.   Cardiovascular: Positive for palpitations. Negative for chest pain, orthopnea, claudication, leg swelling and PND.  Gastrointestinal: Positive for heartburn. Negative for abdominal pain, blood in stool, melena, nausea and vomiting.  Genitourinary: Negative for hematuria.  Musculoskeletal: Negative for falls and myalgias.  Skin: Negative for rash.  Neurological: Negative for dizziness, tingling, tremors, sensory change, speech change, focal weakness, loss of consciousness and weakness.    Endo/Heme/Allergies: Does not bruise/bleed easily.  Psychiatric/Behavioral: Negative for substance abuse. The patient is not nervous/anxious.   All other systems reviewed and are negative.    EKGs/Labs/Other Studies Reviewed:  Studies reviewed were summarized above. The additional studies were reviewed today:  2D Echo 01/2019: 1. The left ventricle has normal systolic function, with an ejection fraction of 55-60%. The cavity size was normal. Left ventricular diastolic parameters were normal.  2. The right ventricle has normal systolic function. The cavity was normal. There is no increase in right ventricular wall thickness. Right ventricular systolic pressure is mildly elevated with an estimated pressure of 38.3 mmHg.  3. Left atrial size was mildly dilated.  4. Trivial pericardial effusion is present. __________  Elwyn Reach 11/2018: Normal sinus rhythm with an average heart rate of 71 bpm. 34 episodes of short SVTs.  The longest lasted 15 beats. Rare PVCs.   EKG:  EKG is ordered today.  The EKG ordered today demonstrates NSR, 68 bpm low voltage, no acute ST-T changes, essentially unchanged from prior  Recent Labs: 12/10/2018: Magnesium 1.8; TSH 2.258 12/18/2018: ALT 14; BUN 14; Creatinine 0.80; Hemoglobin 13.2; Platelet Count 211; Potassium 4.1; Sodium 141  Recent Lipid Panel    Component Value Date/Time   CHOL 110 08/08/2013 1127   TRIG 82.0 08/08/2013 1127   HDL 36.50 (L) 08/08/2013 1127   CHOLHDL 3 08/08/2013 1127   VLDL 16.4 08/08/2013 1127   LDLCALC 57 08/08/2013 1127    PHYSICAL EXAM:    VS:  BP 120/69    Pulse 68    Temp (!) 97 F (36.1 C)    Ht _0  (1.575 m)    Wt 164 lb 12.8 oz (74.8 kg)    BMI 30.14 kg/m   BMI: Body mass index is 30.14 kg/m.  Physical Exam  Constitutional: She is oriented to person, place, and time. She appears well-developed and well-nourished.  HENT:  Head: Normocephalic and atraumatic.  Eyes: Right eye exhibits no discharge. Left eye  exhibits no discharge.  Neck: Normal range of motion. No JVD present.  Cardiovascular: Normal rate, regular rhythm, S1 normal, S2 normal and normal heart sounds. Exam reveals no distant heart sounds, no friction rub, no midsystolic click and no opening snap.  No murmur heard. Pulses:      Posterior tibial pulses are 2+ on the right side and 2+ on the left side.  Pulmonary/Chest: Effort normal and breath sounds normal. No respiratory distress. She has no decreased breath sounds. She has no wheezes. She has no rales. She exhibits no tenderness.  Abdominal: Soft. She exhibits no distension. There is no abdominal tenderness.  Musculoskeletal:        General: No edema.  Neurological: She is alert and oriented to person, place, and time.  Skin: Skin is warm and dry. No cyanosis. Nails show no clubbing.  Psychiatric: She has a normal mood and affect. Her speech is normal and behavior is normal. Judgment and thought content normal.    Wt Readings from Last 3 Encounters:  02/25/19 164 lb 12.8 oz (74.8 kg)  02/12/19 163 lb 3.2 oz (74 kg)  01/15/19 162 lb 11.2 oz (73.8 kg)     ASSESSMENT & PLAN:   1. Paroxysmal SVT: Maintaining sinus rhythm today.  She did have an episode since she was last seen of palpitations with heart rate into the 140s bpm which resolved with metoprolol.  She was noted to have 34 episodes of SVT on recent ZIO monitor with the longest episode lasting 15 beats.  We did discuss further optimization of medical therapy and possible referral to EP for ablation.  Given episodes are so short-lived we have agreed to escalate medical  therapy by titrating Lopressor to 37.5 mg twice daily.  She was given precautions of soft blood pressure and bradycardia with this escalation.  Should she note increased fatigue, lethargy, or dizziness or if she notes breakthrough tachypalpitations she will let us know for further medication adjustment with possible referral to EP at that time.  However, she  prefers to avoid invasive procedure if at all possible.  2. Pericardial effusion: Patient reports a history of previously developing a large pericardial effusion and was seen by [facility] cardiology potential plan of pericardiocentesis.  However, she reports a history of improvement in effusion without intervention.  Echo in 2018 showed a small pericardial effusion.  Most recent echo from 01/2019 showed continued improvement in an pericardial effusion, now trivial.  3. Hypertension: Blood pressure is well controlled today.  4. Multiple myeloma/MDS: Followed by [facility].  5. Mild pulmonary hypertension: Most recent echo showed a right-sided pressure of 38.3 mmHg.  Asymptomatic.  Case was discussed with patient's primary cardiologist with recommendation to defer diuretic therapy at this time given underlying multiple myeloma and concerns for AKI.  Continue to monitor.  6. Medication management: Of note, patient indicates she takes aspirin 325 mg when she takes Pomalyst at the direction of her oncologist.  In this setting, we have changed her aspirin dosing on her medication list accordingly.  Disposition: F/u with Dr. Fletcher Anon in 6 months.   Medication Adjustments/Labs and Tests Ordered: Current medicines are reviewed at length with the patient today.  Concerns regarding medicines are outlined above. Medication changes, Labs and Tests ordered today are summarized above and listed in the Patient Instructions accessible in Encounters.   Signed, Christell Faith, PA-C 02/25/2019 11:24 AM     [place] [address] [place], [place] [address] [phone]

## 2019-02-25 ENCOUNTER — Encounter: Payer: Self-pay | Admitting: Physician Assistant

## 2019-02-25 ENCOUNTER — Other Ambulatory Visit: Payer: Self-pay

## 2019-02-25 ENCOUNTER — Ambulatory Visit: Payer: Medicare HMO | Admitting: Physician Assistant

## 2019-02-25 VITALS — BP 120/69 | HR 68 | Temp 97.0°F | Ht 62.0 in | Wt 164.8 lb

## 2019-02-25 DIAGNOSIS — I313 Pericardial effusion (noninflammatory): Secondary | ICD-10-CM

## 2019-02-25 DIAGNOSIS — I10 Essential (primary) hypertension: Secondary | ICD-10-CM

## 2019-02-25 DIAGNOSIS — C9000 Multiple myeloma not having achieved remission: Secondary | ICD-10-CM

## 2019-02-25 DIAGNOSIS — Z79899 Other long term (current) drug therapy: Secondary | ICD-10-CM

## 2019-02-25 DIAGNOSIS — I3139 Other pericardial effusion (noninflammatory): Secondary | ICD-10-CM

## 2019-02-25 DIAGNOSIS — I471 Supraventricular tachycardia: Secondary | ICD-10-CM | POA: Diagnosis not present

## 2019-02-25 MED ORDER — METOPROLOL TARTRATE 25 MG PO TABS: 37.5000 mg | ORAL_TABLET | Freq: Two times a day (BID) | ORAL | 3 refills | Status: DC

## 2019-02-25 NOTE — Patient Instructions (Signed)
Medication Instructions:  1- INCREASE Lopressor to Take 1.5 tablets (37.5 mg total) by mouth 2 (two) times daily.  If you need a refill on your cardiac medications before your next appointment, please call your pharmacy.   Lab work: None ordered  If you have labs (blood work) drawn today and your tests are completely normal, you will receive your results only by: Marland Kitchen MyChart Message (if you have MyChart) OR . A paper copy in the mail If you have any lab test that is abnormal or we need to change your treatment, we will call you to review the results.  Testing/Procedures: None ordered   Follow-Up: At [facility], you and your health needs are our priority.  As part of our continuing mission to provide you with exceptional heart care, we have created designated Provider Care Teams.  These Care Teams include your primary Cardiologist (physician) and Advanced Practice Providers (APPs -  Physician Assistants and Nurse Practitioners) who all work together to provide you with the care you need, when you need it. You will need a follow up appointment in 6 months.  Please call our office 2 months in advance to schedule this appointment.  Please see Kathlyn Sacramento, MD.

## 2019-03-04 ENCOUNTER — Encounter: Payer: Self-pay | Admitting: Hematology and Oncology

## 2019-03-05 ENCOUNTER — Other Ambulatory Visit: Payer: Self-pay

## 2019-03-05 DIAGNOSIS — C9002 Multiple myeloma in relapse: Secondary | ICD-10-CM

## 2019-03-05 MED ORDER — POMALIDOMIDE 2 MG PO CAPS: 2.0000 mg | ORAL_CAPSULE | Freq: Every day | ORAL | 0 refills | Status: DC

## 2019-03-06 ENCOUNTER — Other Ambulatory Visit: Payer: Self-pay | Admitting: Hematology and Oncology

## 2019-03-06 DIAGNOSIS — C9000 Multiple myeloma not having achieved remission: Secondary | ICD-10-CM

## 2019-03-12 ENCOUNTER — Inpatient Hospital Stay: Payer: Medicare HMO | Attending: Hematology and Oncology

## 2019-03-12 ENCOUNTER — Inpatient Hospital Stay: Payer: Medicare HMO

## 2019-03-12 ENCOUNTER — Other Ambulatory Visit: Payer: Self-pay

## 2019-03-12 ENCOUNTER — Inpatient Hospital Stay (HOSPITAL_BASED_OUTPATIENT_CLINIC_OR_DEPARTMENT_OTHER): Payer: Medicare HMO | Admitting: Hematology and Oncology

## 2019-03-12 VITALS — BP 101/57 | HR 66 | Temp 97.9°F | Resp 18

## 2019-03-12 DIAGNOSIS — C9002 Multiple myeloma in relapse: Secondary | ICD-10-CM | POA: Insufficient documentation

## 2019-03-12 DIAGNOSIS — Z9221 Personal history of antineoplastic chemotherapy: Secondary | ICD-10-CM | POA: Insufficient documentation

## 2019-03-12 DIAGNOSIS — Z9484 Stem cells transplant status: Secondary | ICD-10-CM | POA: Insufficient documentation

## 2019-03-12 DIAGNOSIS — R0602 Shortness of breath: Secondary | ICD-10-CM

## 2019-03-12 DIAGNOSIS — D469 Myelodysplastic syndrome, unspecified: Secondary | ICD-10-CM | POA: Diagnosis not present

## 2019-03-12 DIAGNOSIS — Z7982 Long term (current) use of aspirin: Secondary | ICD-10-CM | POA: Diagnosis not present

## 2019-03-12 DIAGNOSIS — M8580 Other specified disorders of bone density and structure, unspecified site: Secondary | ICD-10-CM | POA: Diagnosis not present

## 2019-03-12 DIAGNOSIS — Z79899 Other long term (current) drug therapy: Secondary | ICD-10-CM | POA: Diagnosis not present

## 2019-03-12 DIAGNOSIS — Z5112 Encounter for antineoplastic immunotherapy: Secondary | ICD-10-CM | POA: Insufficient documentation

## 2019-03-12 DIAGNOSIS — C9000 Multiple myeloma not having achieved remission: Secondary | ICD-10-CM

## 2019-03-12 LAB — CBC WITH DIFFERENTIAL/PLATELET
Abs Immature Granulocytes: 0 10*3/uL (ref 0.00–0.07)
Basophils Absolute: 0 10*3/uL (ref 0.0–0.1)
Basophils Relative: 1 %
Eosinophils Absolute: 0 10*3/uL (ref 0.0–0.5)
Eosinophils Relative: 0 %
HCT: 39 % (ref 36.0–46.0)
Hemoglobin: 12.9 g/dL (ref 12.0–15.0)
Immature Granulocytes: 0 %
Lymphocytes Relative: 54 %
Lymphs Abs: 2.3 10*3/uL (ref 0.7–4.0)
MCH: 35.9 pg — ABNORMAL HIGH (ref 26.0–34.0)
MCHC: 33.1 g/dL (ref 30.0–36.0)
MCV: 108.6 fL — ABNORMAL HIGH (ref 80.0–100.0)
Monocytes Absolute: 0.6 10*3/uL (ref 0.1–1.0)
Monocytes Relative: 14 %
Neutro Abs: 1.3 10*3/uL — ABNORMAL LOW (ref 1.7–7.7)
Neutrophils Relative %: 31 %
Platelets: 180 10*3/uL (ref 150–400)
RBC: 3.59 MIL/uL — ABNORMAL LOW (ref 3.87–5.11)
RDW: 13.2 % (ref 11.5–15.5)
WBC: 4.3 10*3/uL (ref 4.0–10.5)
nRBC: 0 % (ref 0.0–0.2)

## 2019-03-12 LAB — COMPREHENSIVE METABOLIC PANEL
ALT: 10 U/L (ref 0–44)
AST: 13 U/L — ABNORMAL LOW (ref 15–41)
Albumin: 3.5 g/dL (ref 3.5–5.0)
Alkaline Phosphatase: 70 U/L (ref 38–126)
Anion gap: 7 (ref 5–15)
BUN: 9 mg/dL (ref 8–23)
CO2: 26 mmol/L (ref 22–32)
Calcium: 8.7 mg/dL — ABNORMAL LOW (ref 8.9–10.3)
Chloride: 108 mmol/L (ref 98–111)
Creatinine, Ser: 0.73 mg/dL (ref 0.44–1.00)
GFR calc Af Amer: >60 mL/min (ref >60)
GFR calc non Af Amer: >60 mL/min (ref >60)
Glucose, Bld: 103 mg/dL — ABNORMAL HIGH (ref 70–99)
Potassium: 4.2 mmol/L (ref 3.5–5.1)
Sodium: 141 mmol/L (ref 135–145)
Total Bilirubin: 0.2 mg/dL — ABNORMAL LOW (ref 0.3–1.2)
Total Protein: 5.9 g/dL — ABNORMAL LOW (ref 6.5–8.1)

## 2019-03-12 MED ORDER — DIPHENHYDRAMINE HCL 25 MG PO CAPS
50.0000 mg | ORAL_CAPSULE | Freq: Once | ORAL | Status: AC
  Administered 2019-03-12: 50 mg via ORAL

## 2019-03-12 MED ORDER — SODIUM CHLORIDE 0.9 % IV SOLN
16.0000 mg/kg | Freq: Once | INTRAVENOUS | Status: AC
  Administered 2019-03-12: 1200 mg via INTRAVENOUS
  Filled 2019-03-12: qty 60

## 2019-03-12 MED ORDER — ACETAMINOPHEN 325 MG PO TABS
ORAL_TABLET | ORAL | Status: AC
  Filled 2019-03-12: qty 2

## 2019-03-12 MED ORDER — SODIUM CHLORIDE 0.9% FLUSH
10.0000 mL | INTRAVENOUS | Status: DC | PRN
  Administered 2019-03-12: 10 mL
  Filled 2019-03-12: qty 10

## 2019-03-12 MED ORDER — SODIUM CHLORIDE 0.9 % IV SOLN
Freq: Once | INTRAVENOUS | Status: AC
  Administered 2019-03-12: 11:00:00 via INTRAVENOUS
  Filled 2019-03-12: qty 250

## 2019-03-12 MED ORDER — DEXAMETHASONE SODIUM PHOSPHATE 10 MG/ML IJ SOLN
INTRAMUSCULAR | Status: AC
  Filled 2019-03-12: qty 1

## 2019-03-12 MED ORDER — DIPHENHYDRAMINE HCL 25 MG PO CAPS
ORAL_CAPSULE | ORAL | Status: AC
  Filled 2019-03-12: qty 2

## 2019-03-12 MED ORDER — PROCHLORPERAZINE MALEATE 10 MG PO TABS
ORAL_TABLET | ORAL | Status: AC
  Filled 2019-03-12: qty 1

## 2019-03-12 MED ORDER — PROCHLORPERAZINE MALEATE 10 MG PO TABS
10.0000 mg | ORAL_TABLET | Freq: Once | ORAL | Status: AC
  Administered 2019-03-12: 10 mg via ORAL

## 2019-03-12 MED ORDER — SODIUM CHLORIDE 0.9% FLUSH
10.0000 mL | Freq: Once | INTRAVENOUS | Status: AC
  Administered 2019-03-12: 10 mL
  Filled 2019-03-12: qty 10

## 2019-03-12 MED ORDER — HEPARIN SOD (PORK) LOCK FLUSH 100 UNIT/ML IV SOLN
500.0000 [IU] | Freq: Once | INTRAVENOUS | Status: AC | PRN
  Administered 2019-03-12: 500 [IU]
  Filled 2019-03-12: qty 5

## 2019-03-12 MED ORDER — DEXAMETHASONE SODIUM PHOSPHATE 10 MG/ML IJ SOLN
10.0000 mg | Freq: Once | INTRAMUSCULAR | Status: AC
  Administered 2019-03-12: 11:00:00 10 mg via INTRAVENOUS

## 2019-03-12 MED ORDER — ACETAMINOPHEN 325 MG PO TABS
650.0000 mg | ORAL_TABLET | Freq: Once | ORAL | Status: AC
  Administered 2019-03-12: 650 mg via ORAL

## 2019-03-12 NOTE — Patient Instructions (Signed)
[facility] Discharge Instructions for Patients Receiving Chemotherapy  Today you received the following chemotherapy agents: Darzalex  To help prevent nausea and vomiting after your treatment, we encourage you to take your nausea medication as directed.   If you develop nausea and vomiting that is not controlled by your nausea medication, call the clinic.   BELOW ARE SYMPTOMS THAT SHOULD BE REPORTED IMMEDIATELY:  *FEVER GREATER THAN 100.5 F  *CHILLS WITH OR WITHOUT FEVER  NAUSEA AND VOMITING THAT IS NOT CONTROLLED WITH YOUR NAUSEA MEDICATION  *UNUSUAL SHORTNESS OF BREATH  *UNUSUAL BRUISING OR BLEEDING  TENDERNESS IN MOUTH AND THROAT WITH OR WITHOUT PRESENCE OF ULCERS  *URINARY PROBLEMS  *BOWEL PROBLEMS  UNUSUAL RASH Items with * indicate a potential emergency and should be followed up as soon as possible.  Feel free to call the clinic should you have any questions or concerns. The clinic phone number is [phone].  Please show the CHEMO ALERT CARD at check-in to the Emergency Department and triage nurse.   Coronavirus (COVID-19) Are you at risk?  Are you at risk for the Coronavirus (COVID-19)?  To be considered HIGH RISK for Coronavirus (COVID-19), you have to meet the following criteria:  . Traveled to [place], [place], [name], [place] or [place]; or in the [place] to [place], [place], [place], or [place]; and have fever, cough, and shortness of breath within the last 2 weeks of travel OR . Been in close contact with a person diagnosed with COVID-19 within the last 2 weeks and have fever, cough, and shortness of breath . IF YOU DO NOT MEET THESE CRITERIA, YOU ARE CONSIDERED LOW RISK FOR COVID-19.  What to do if you are HIGH RISK for COVID-19?  . If you are having a medical emergency, call 911. . Seek medical care right away. Before you go to a doctor's office, urgent care or emergency department, call ahead and tell them about your  recent travel, contact with someone diagnosed with COVID-19, and your symptoms. You should receive instructions from your physician's office regarding next steps of care.  . When you arrive at healthcare provider, tell the healthcare staff immediately you have returned from visiting [place], [place], [place], [place] or [name]; or traveled in the [place] to [place], [place], [place], or [place]; in the last two weeks or you have been in close contact with a person diagnosed with COVID-19 in the last 2 weeks.   . Tell the health care staff about your symptoms: fever, cough and shortness of breath. . After you have been seen by a medical provider, you will be either: o Tested for (COVID-19) and discharged home on quarantine except to seek medical care if symptoms worsen, and asked to  - Stay home and avoid contact with others until you get your results (4-5 days)  - Avoid travel on public transportation if possible (such as bus, train, or airplane) or o Sent to the Emergency Department by EMS for evaluation, COVID-19 testing, and possible admission depending on your condition and test results.  What to do if you are LOW RISK for COVID-19?  Reduce your risk of any infection by using the same precautions used for avoiding the common cold or flu:  . Wash your hands often with soap and warm water for at least 20 seconds.  If soap and water are not readily available, use an alcohol-based hand sanitizer with at least 60% alcohol.  . If coughing or   sneezing, cover your mouth and nose by coughing or sneezing into the elbow areas of your shirt or coat, into a tissue or into your sleeve (not your hands). . Avoid shaking hands with others and consider head nods or verbal greetings only. . Avoid touching your eyes, nose, or mouth with unwashed hands.  . Avoid close contact with people who are sick. . Avoid places or events with large numbers of people in one location, like concerts or sporting  events. . Carefully consider travel plans you have or are making. . If you are planning any travel outside or inside the [name], visit the CDC's Travelers' Health webpage for the latest health notices. . If you have some symptoms but not all symptoms, continue to monitor at home and seek medical attention if your symptoms worsen. . If you are having a medical emergency, call 911.   [place] / e-Visit: [email or web address]         [facility]: [facility]: [phone]                   [facility]: [phone]

## 2019-03-12 NOTE — Progress Notes (Signed)
Okay to treat with ANC 1.3, per Dr. Alvy Bimler.

## 2019-03-12 NOTE — Progress Notes (Signed)
Ok to give zometa with calcium =8.7

## 2019-03-13 ENCOUNTER — Encounter: Payer: Self-pay | Admitting: Hematology and Oncology

## 2019-03-13 ENCOUNTER — Telehealth: Payer: Self-pay | Admitting: Hematology and Oncology

## 2019-03-13 LAB — KAPPA/LAMBDA LIGHT CHAINS
Kappa free light chain: 18.1 mg/L (ref 3.3–19.4)
Kappa, lambda light chain ratio: 0.87 (ref 0.26–1.65)
Lambda free light chains: 20.9 mg/L (ref 5.7–26.3)

## 2019-03-13 LAB — MULTIPLE MYELOMA PANEL, SERUM
Albumin SerPl Elph-Mcnc: 3.2 g/dL (ref 2.9–4.4)
Albumin/Glob SerPl: 1.6 (ref 0.7–1.7)
Alpha 1: 0.2 g/dL (ref 0.0–0.4)
Alpha2 Glob SerPl Elph-Mcnc: 0.8 g/dL (ref 0.4–1.0)
B-Globulin SerPl Elph-Mcnc: 0.7 g/dL (ref 0.7–1.3)
Gamma Glob SerPl Elph-Mcnc: 0.4 g/dL (ref 0.4–1.8)
Globulin, Total: 2.1 g/dL — ABNORMAL LOW (ref 2.2–3.9)
IgA: 202 mg/dL (ref 64–422)
IgG (Immunoglobin G), Serum: 477 mg/dL — ABNORMAL LOW (ref 586–1602)
IgM (Immunoglobulin M), Srm: 58 mg/dL (ref 26–217)
Total Protein ELP: 5.3 g/dL — ABNORMAL LOW (ref 6.0–8.5)

## 2019-03-13 NOTE — Assessment & Plan Note (Signed)
She continues to have excessive fatigue and shortness of breath She had extensive evaluation by cardiologist and is currently on medical management

## 2019-03-13 NOTE — Progress Notes (Signed)
[facility] OFFICE PROGRESS NOTE  Patient Care Team: [name], [name], MD as PCP - General (Internal Medicine) [name], MD as PCP - Cardiology (Cardiology) [name], MD as Referring Physician (Hematology and Oncology) [name], MD as Consulting Physician (Internal Medicine) [name], [name], MD as Consulting Physician (Infectious Diseases) [name], MD as Consulting Physician (Hematology and Oncology) [name], NP as Nurse Practitioner (Hematology and Oncology)  ASSESSMENT & PLAN:  Multiple myeloma [facility]) The patient has achieved very good partial remission with intermittent detectable M spike She has appointment to follow-up at [facility]  She will continue daratumumab along with pomalidomide She has completed tapered off dexamethasone She will continue acyclovir for antimicrobial prophylaxis She will continue aspirin for DVT prophylaxis She will continue calcium with vitamin D and IV bisphosphonate every 3 months;  She will return to [facility] in January for repeat bone marrow aspirate and biopsy  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   INTERVAL HISTORY: Please see below for problem oriented charting. She returns for treatment and follow-up She complains of fatigue and shortness of breath on exertion No recent infection, fever or chills No cough Denies infusion reaction.  No new bone pain.  No recent dental issues.  SUMMARY OF ONCOLOGIC HISTORY: Oncology History Overview Note  Multiple myeloma, Ig A Lambda, M spike 3.54 grams, Calcium 9.2, Creatinine 0.8, Beta 2 microglobulin 4.52, IgA 4840  mg/dL, lambda light chain 75.4, albumin 3.6, hemoglobin 9.7, platelet 115    Primary site: Multiple Myeloma   Staging method: AJCC 6th Edition   Clinical: Stage IIA signed by [name], MD on 11/07/2013  2:46 PM   Summary: Stage IIA      Multiple myeloma ([place])  10/31/2013 Bone Marrow Biopsy   Bone marrow biopsy confirmed multiple myeloma with 40% bone marrow involvement. Skeletal survey showed minimal lesions in her score with generalized demineralization   11/10/2013 - 02/13/2014 Chemotherapy   The patient is started on induction chemotherapy with weekly dexamethasone 40 mg by mouth as well as Velcade subcutaneous injection on days 1, 4, 8 and 11. On 11/21/2013, she was started on monthly Zometa.   12/23/2013 Adverse Reaction   The dose of Velcade was reduced due to thrombocytopenia.   01/28/2014 - 04/07/2014 Chemotherapy   Revlimid is added. Treatment was discontinued due to lack of response.   02/24/2014 - 04/07/2014 Chemotherapy   Due to worsening peripheral neuropathy, Velcade injection is changed to once a week. Revlimid was given 21 days on, 7 days off.   04/07/2014 - 04/10/2014 Chemotherapy   Revlimid was discontinued due to lack of response. Chemotherapy was changed back to Velcade injection twice a week, 2 weeks on 1 week off. Her treatment was switched to to minimum response   04/20/2014 - 06/02/2014 Chemotherapy   chemotherapy is switched to Carfilzomib, Cytoxan and dexamethasone.   04/22/2014 Procedure   she has placement of port for chemotherapy.   06/01/2014 Tumor Marker   Bloodwork show that she has greater than partial response   06/23/2014 Bone Marrow Biopsy   Bone marrow biopsy show 5-10% residual plasma cells, normal cytogenetics and FISH   07/07/2014 Procedure   She had stem cell  collection   07/22/2014 - 07/22/2014 Chemotherapy   She had high-dose chemotherapy with melphalan   07/23/2014 Bone Marrow Transplant   She had bone marrow transplant in autologous  fashion at [facility]   10/20/2014 - 03/24/2015 Chemotherapy    she received chemotherapy with Kyprolis, Revlimid and dexamethasone   10/22/2014 Procedure   She has port placement   01/19/2015 Tumor Marker   IgA lambda M spike at 0.4 g    01/20/2015 Miscellaneous   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 No cource was found and fever resolved with IV vancomycin and meropenem   09/13/2015 Bone Marrow Biopsy   Bone marrow biopsy showed no increased blasts, 3-4 % plasma cells   03/02/2016 Bone Marrow Biopsy   Bone marrow biopsy at [facility] showed normocellular (30%) bone marrow with trilineage hematopoiesis. No significant increase in blasts. No significant increase in plasma cells.   05/12/2016 Imaging   DEXA scan at [facility] showed osteopenia   10/24/2016 Imaging   Skeletal survey at [facility], no new lesions   12/07/2017 Imaging   No focal abnormality noted to suggest myeloma. Exam is stable from prior exam.   03/01/2018 Procedure   Successful 8 French right internal jugular vein power port placement with its tip at the SVC/RA junction.   03/06/2018 -  Chemotherapy   The patient had daratumumab    07/09/2018 Bone Marrow Biopsy   Bone marrow biopsy at [facility] showed residual disease at 0.004% plasma cells   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[facility])  04/06/2015 Bone Marrow Biopsy   Accession: [id number] BM biopsy showed RAEB-1   04/06/2015 Tumor Marker   Cytogenetics and FISH for MDS are within normal limits   10/06/2015 - 10/10/2015  Chemotherapy   She received conditioning chemotherapy with busulfan and melphalan   10/12/2015 Bone Marrow Transplant   She received allogenic stem cell transplant   10/19/2015 Adverse Reaction   She developed posttransplant complication with mucositis, viral infection with rhinovirus, neutropenic fever, bilateral pleural effusion and moderate pericardial effusion and CMV reactivation.   10/31/2015 Miscellaneous   She has engrafted     REVIEW OF SYSTEMS:   Constitutional: Denies fevers, chills or abnormal weight loss Eyes: Denies blurriness of vision Ears, nose, mouth, throat, and face: Denies mucositis or sore throat Respiratory: Denies cough, dyspnea or wheezes Cardiovascular: Denies palpitation, chest discomfort or lower extremity swelling Gastrointestinal:  Denies nausea, heartburn or change in bowel habits Skin: Denies abnormal skin rashes Lymphatics: Denies new lymphadenopathy or easy bruising Neurological:Denies numbness, tingling or new weaknesses Behavioral/Psych: Mood is stable, no new changes  All other systems were reviewed with the patient and are negative.  I have reviewed the past medical history, past surgical history, social history and family history with the patient and they are unchanged from previous note.  ALLERGIES:  has No Known Allergies.  MEDICATIONS:  Current Outpatient Medications  Medication Sig Dispense Refill  . acyclovir (ZOVIRAX) 400 MG tablet Take 1 tablet (400 mg total) by mouth 2 (two) times daily. 180 tablet 11  . aspirin 81 MG   chewable tablet Chew 325 mg by mouth daily. Takes 4 tablets (325 mg daily) 21 days out of the month with chemo drug    . calcipotriene (DOVONOX) 0.005 % cream daily.     . calcium carbonate (TUMS - DOSED IN MG ELEMENTAL CALCIUM) 500 MG chewable tablet Chew 1 tablet by mouth daily.    . Cholecalciferol (VITAMIN D-1000 MAX ST) 1000 units tablet Take 1,000 Units by mouth daily.    . cyanocobalamin 1000 MCG tablet Take 1,000  mcg by mouth daily.    . DARATUMUMAB IV Inject into the vein every 30 (thirty) days.    . docusate sodium (COLACE) 100 MG capsule Take 100 mg by mouth daily as needed for mild constipation.     . loperamide (IMODIUM) 2 MG capsule Take by mouth as needed for diarrhea or loose stools. As needed only    . metoprolol tartrate (LOPRESSOR) 25 MG tablet Take 1.5 tablets (37.5 mg total) by mouth 2 (two) times daily. 270 tablet 3  . mirtazapine (REMERON) 15 MG tablet Take 1 tablet (15 mg total) by mouth at bedtime. 90 tablet 1  . Multiple Vitamin (MULTIVITAMIN WITH MINERALS) TABS tablet Take 1 tablet by mouth daily.    . ondansetron (ZOFRAN) 8 MG tablet Take 1 tablet (8 mg total) by mouth every 8 (eight) hours as needed (Nausea or vomiting). 30 tablet 1  . pantoprazole (PROTONIX) 40 MG tablet Take 1 tablet (40 mg total) by mouth daily. Take 30-60 minutes before breakfast and dinner 180 tablet 2  . pomalidomide (POMALYST) 2 MG capsule Take 1 capsule (2 mg total) by mouth daily. Take with water on days 1-21. Repeat every 28 days. 21 capsule 0  . prochlorperazine (COMPAZINE) 10 MG tablet Take 1 tablet (10 mg total) by mouth every 6 (six) hours as needed (Nausea or vomiting). 30 tablet 1   No current facility-administered medications for this visit.     PHYSICAL EXAMINATION: ECOG PERFORMANCE STATUS: 1 - Symptomatic but completely ambulatory  Vitals:   03/12/19 0933  BP: 114/68  Pulse: (!) 58  Resp: 18  Temp: 97.8 F (36.6 C)  SpO2: 100%   Filed Weights   03/12/19 0933  Weight: 167 lb 12.8 oz (76.1 kg)    GENERAL:alert, no distress and comfortable SKIN: skin color, texture, turgor are normal, no rashes or significant lesions EYES: normal, Conjunctiva are pink and non-injected, sclera clear OROPHARYNX:no exudate, no erythema and lips, buccal mucosa, and tongue normal  NECK: supple, thyroid normal size, non-tender, without nodularity LYMPH:  no palpable lymphadenopathy in the cervical, axillary  or inguinal LUNGS: clear to auscultation and percussion with normal breathing effort HEART: regular rate & rhythm and no murmurs and no lower extremity edema ABDOMEN:abdomen soft, non-tender and normal bowel sounds Musculoskeletal:no cyanosis of digits and no clubbing  NEURO: alert & oriented x 3 with fluent speech, no focal motor/sensory deficits  LABORATORY DATA:  I have reviewed the data as listed    Component Value Date/Time   NA 141 03/12/2019 0910   NA 142 01/04/2017 0902   K 4.2 03/12/2019 0910   K 3.9 01/04/2017 0902   CL 108 03/12/2019 0910   CO2 26 03/12/2019 0910   CO2 28 01/04/2017 0902   GLUCOSE 103 (H) 03/12/2019 0910   GLUCOSE 92 01/04/2017 0902   BUN 9 03/12/2019 0910   BUN 9.5 01/04/2017 0902   CREATININE 0.73 03/12/2019 0910   CREATININE 0.80 12/18/2018 0901   CREATININE 0.7 01/04/2017 0902     CALCIUM 8.7 (L) 03/12/2019 0910   CALCIUM 9.0 01/04/2017 0902   PROT 5.9 (L) 03/12/2019 0910   PROT 6.0 (L) 01/04/2017 0902   PROT 5.8 (L) 01/04/2017 0902   ALBUMIN 3.5 03/12/2019 0910   ALBUMIN 3.4 (L) 01/04/2017 0902   AST 13 (L) 03/12/2019 0910   AST 18 12/18/2018 0901   AST 21 01/04/2017 0902   ALT 10 03/12/2019 0910   ALT 14 12/18/2018 0901   ALT 16 01/04/2017 0902   ALKPHOS 70 03/12/2019 0910   ALKPHOS 101 01/04/2017 0902   BILITOT 0.2 (L) 03/12/2019 0910   BILITOT 0.3 12/18/2018 0901   BILITOT 0.56 01/04/2017 0902   GFRNONAA >60 03/12/2019 0910   GFRNONAA >60 12/18/2018 0901   GFRAA >60 03/12/2019 0910   GFRAA >60 12/18/2018 0901    No results found for: SPEP, UPEP  Lab Results  Component Value Date   WBC 4.3 03/12/2019   NEUTROABS 1.3 (L) 03/12/2019   HGB 12.9 03/12/2019   HCT 39.0 03/12/2019   MCV 108.6 (H) 03/12/2019   PLT 180 03/12/2019      Chemistry      Component Value Date/Time   NA 141 03/12/2019 0910   NA 142 01/04/2017 0902   K 4.2 03/12/2019 0910   K 3.9 01/04/2017 0902   CL 108 03/12/2019 0910   CO2 26 03/12/2019 0910    CO2 28 01/04/2017 0902   BUN 9 03/12/2019 0910   BUN 9.5 01/04/2017 0902   CREATININE 0.73 03/12/2019 0910   CREATININE 0.80 12/18/2018 0901   CREATININE 0.7 01/04/2017 0902      Component Value Date/Time   CALCIUM 8.7 (L) 03/12/2019 0910   CALCIUM 9.0 01/04/2017 0902   ALKPHOS 70 03/12/2019 0910   ALKPHOS 101 01/04/2017 0902   AST 13 (L) 03/12/2019 0910   AST 18 12/18/2018 0901   AST 21 01/04/2017 0902   ALT 10 03/12/2019 0910   ALT 14 12/18/2018 0901   ALT 16 01/04/2017 0902   BILITOT 0.2 (L) 03/12/2019 0910   BILITOT 0.3 12/18/2018 0901   BILITOT 0.56 01/04/2017 0902     All questions were answered. The patient knows to call the clinic with any problems, questions or concerns. No barriers to learning was detected.  I spent 15 minutes counseling the patient face to face. The total time spent in the appointment was 20 minutes and more than 50% was on counseling and review of test results  Ni Gorsuch, MD 03/13/2019 10:03 AM  

## 2019-03-13 NOTE — Telephone Encounter (Signed)
I talk with patient regarding schedule  

## 2019-03-13 NOTE — Assessment & Plan Note (Signed)
The patient has achieved very good partial remission with intermittent detectable M spike She has appointment to follow-up at [facility]  She will continue daratumumab along with pomalidomide She has completed tapered off dexamethasone She will continue acyclovir for antimicrobial prophylaxis She will continue aspirin for DVT prophylaxis She will continue calcium with vitamin D and IV bisphosphonate every 3 months;  She will return to [facility] in January for repeat bone marrow aspirate and biopsy

## 2019-03-13 NOTE — Assessment & Plan Note (Signed)
She is fully engrafted with no signs of pancytopenia

## 2019-03-14 ENCOUNTER — Telehealth: Payer: Self-pay | Admitting: *Deleted

## 2019-03-14 NOTE — Telephone Encounter (Signed)
Patient called and discussed lab results. Patient verbalized an understanding

## 2019-03-14 NOTE — Telephone Encounter (Signed)
-----   Message from [name], MD sent at 03/14/2019  7:25 AM EDT ----- Regarding: myeloma panel looks good, pls call her

## 2019-03-16 ENCOUNTER — Other Ambulatory Visit: Payer: Self-pay | Admitting: Hematology and Oncology

## 2019-03-17 DIAGNOSIS — Z1231 Encounter for screening mammogram for malignant neoplasm of breast: Secondary | ICD-10-CM | POA: Diagnosis not present

## 2019-03-27 ENCOUNTER — Other Ambulatory Visit: Payer: Self-pay | Admitting: *Deleted

## 2019-03-27 DIAGNOSIS — C9002 Multiple myeloma in relapse: Secondary | ICD-10-CM

## 2019-03-27 MED ORDER — POMALIDOMIDE 2 MG PO CAPS: 2.0000 mg | ORAL_CAPSULE | Freq: Every day | ORAL | 0 refills | Status: DC

## 2019-04-09 ENCOUNTER — Inpatient Hospital Stay (HOSPITAL_BASED_OUTPATIENT_CLINIC_OR_DEPARTMENT_OTHER): Payer: Medicare HMO | Admitting: Hematology and Oncology

## 2019-04-09 ENCOUNTER — Inpatient Hospital Stay: Payer: Medicare HMO | Attending: Hematology and Oncology

## 2019-04-09 ENCOUNTER — Inpatient Hospital Stay: Payer: Medicare HMO

## 2019-04-09 ENCOUNTER — Other Ambulatory Visit: Payer: Self-pay

## 2019-04-09 ENCOUNTER — Encounter: Payer: Self-pay | Admitting: Hematology and Oncology

## 2019-04-09 VITALS — BP 122/58 | HR 65 | Temp 98.2°F | Resp 17

## 2019-04-09 DIAGNOSIS — D469 Myelodysplastic syndrome, unspecified: Secondary | ICD-10-CM | POA: Diagnosis not present

## 2019-04-09 DIAGNOSIS — C9000 Multiple myeloma not having achieved remission: Secondary | ICD-10-CM

## 2019-04-09 DIAGNOSIS — D696 Thrombocytopenia, unspecified: Secondary | ICD-10-CM | POA: Insufficient documentation

## 2019-04-09 DIAGNOSIS — Z7982 Long term (current) use of aspirin: Secondary | ICD-10-CM | POA: Diagnosis not present

## 2019-04-09 DIAGNOSIS — D801 Nonfamilial hypogammaglobulinemia: Secondary | ICD-10-CM

## 2019-04-09 DIAGNOSIS — Z9481 Bone marrow transplant status: Secondary | ICD-10-CM | POA: Diagnosis not present

## 2019-04-09 DIAGNOSIS — Z79899 Other long term (current) drug therapy: Secondary | ICD-10-CM | POA: Insufficient documentation

## 2019-04-09 DIAGNOSIS — M8580 Other specified disorders of bone density and structure, unspecified site: Secondary | ICD-10-CM | POA: Insufficient documentation

## 2019-04-09 DIAGNOSIS — C9002 Multiple myeloma in relapse: Secondary | ICD-10-CM

## 2019-04-09 DIAGNOSIS — Z5112 Encounter for antineoplastic immunotherapy: Secondary | ICD-10-CM | POA: Insufficient documentation

## 2019-04-09 DIAGNOSIS — R5381 Other malaise: Secondary | ICD-10-CM | POA: Diagnosis not present

## 2019-04-09 DIAGNOSIS — Z9221 Personal history of antineoplastic chemotherapy: Secondary | ICD-10-CM | POA: Insufficient documentation

## 2019-04-09 DIAGNOSIS — Z23 Encounter for immunization: Secondary | ICD-10-CM | POA: Insufficient documentation

## 2019-04-09 LAB — CBC WITH DIFFERENTIAL/PLATELET
Abs Immature Granulocytes: 0 10*3/uL (ref 0.00–0.07)
Basophils Absolute: 0 10*3/uL (ref 0.0–0.1)
Basophils Relative: 1 %
Eosinophils Absolute: 0 10*3/uL (ref 0.0–0.5)
Eosinophils Relative: 0 %
HCT: 38.1 % (ref 36.0–46.0)
Hemoglobin: 12.5 g/dL (ref 12.0–15.0)
Immature Granulocytes: 0 %
Lymphocytes Relative: 56 %
Lymphs Abs: 2.9 10*3/uL (ref 0.7–4.0)
MCH: 35.6 pg — ABNORMAL HIGH (ref 26.0–34.0)
MCHC: 32.8 g/dL (ref 30.0–36.0)
MCV: 108.5 fL — ABNORMAL HIGH (ref 80.0–100.0)
Monocytes Absolute: 0.7 10*3/uL (ref 0.1–1.0)
Monocytes Relative: 13 %
Neutro Abs: 1.5 10*3/uL — ABNORMAL LOW (ref 1.7–7.7)
Neutrophils Relative %: 30 %
Platelets: 208 10*3/uL (ref 150–400)
RBC: 3.51 MIL/uL — ABNORMAL LOW (ref 3.87–5.11)
RDW: 13.4 % (ref 11.5–15.5)
WBC: 5.1 10*3/uL (ref 4.0–10.5)
nRBC: 0 % (ref 0.0–0.2)

## 2019-04-09 LAB — COMPREHENSIVE METABOLIC PANEL
ALT: 11 U/L (ref 0–44)
AST: 11 U/L — ABNORMAL LOW (ref 15–41)
Albumin: 3.5 g/dL (ref 3.5–5.0)
Alkaline Phosphatase: 73 U/L (ref 38–126)
Anion gap: 8 (ref 5–15)
BUN: 15 mg/dL (ref 8–23)
CO2: 26 mmol/L (ref 22–32)
Calcium: 8.8 mg/dL — ABNORMAL LOW (ref 8.9–10.3)
Chloride: 107 mmol/L (ref 98–111)
Creatinine, Ser: 0.76 mg/dL (ref 0.44–1.00)
GFR calc Af Amer: >60 mL/min (ref >60)
GFR calc non Af Amer: >60 mL/min (ref >60)
Glucose, Bld: 86 mg/dL (ref 70–99)
Potassium: 4 mmol/L (ref 3.5–5.1)
Sodium: 141 mmol/L (ref 135–145)
Total Bilirubin: 0.3 mg/dL (ref 0.3–1.2)
Total Protein: 6.1 g/dL — ABNORMAL LOW (ref 6.5–8.1)

## 2019-04-09 MED ORDER — DIPHENHYDRAMINE HCL 25 MG PO CAPS
50.0000 mg | ORAL_CAPSULE | Freq: Once | ORAL | Status: AC
  Administered 2019-04-09: 50 mg via ORAL

## 2019-04-09 MED ORDER — SODIUM CHLORIDE 0.9% FLUSH
10.0000 mL | Freq: Once | INTRAVENOUS | Status: AC
  Administered 2019-04-09: 10 mL
  Filled 2019-04-09: qty 10

## 2019-04-09 MED ORDER — HEPARIN SOD (PORK) LOCK FLUSH 100 UNIT/ML IV SOLN
500.0000 [IU] | Freq: Once | INTRAVENOUS | Status: AC | PRN
  Administered 2019-04-09: 500 [IU]
  Filled 2019-04-09: qty 5

## 2019-04-09 MED ORDER — PROCHLORPERAZINE MALEATE 10 MG PO TABS
10.0000 mg | ORAL_TABLET | Freq: Once | ORAL | Status: AC
  Administered 2019-04-09: 10 mg via ORAL

## 2019-04-09 MED ORDER — PROCHLORPERAZINE MALEATE 10 MG PO TABS
ORAL_TABLET | ORAL | Status: AC
  Filled 2019-04-09: qty 1

## 2019-04-09 MED ORDER — DIPHENHYDRAMINE HCL 25 MG PO CAPS
ORAL_CAPSULE | ORAL | Status: AC
  Filled 2019-04-09: qty 2

## 2019-04-09 MED ORDER — DEXAMETHASONE SODIUM PHOSPHATE 10 MG/ML IJ SOLN
INTRAMUSCULAR | Status: AC
  Filled 2019-04-09: qty 1

## 2019-04-09 MED ORDER — SODIUM CHLORIDE 0.9 % IV SOLN
16.0000 mg/kg | Freq: Once | INTRAVENOUS | Status: AC
  Administered 2019-04-09: 1200 mg via INTRAVENOUS
  Filled 2019-04-09: qty 60

## 2019-04-09 MED ORDER — INFLUENZA VAC A&B SA ADJ QUAD 0.5 ML IM PRSY
0.5000 mL | PREFILLED_SYRINGE | Freq: Once | INTRAMUSCULAR | Status: AC
  Administered 2019-04-09: 0.5 mL via INTRAMUSCULAR

## 2019-04-09 MED ORDER — INFLUENZA VAC A&B SA ADJ QUAD 0.5 ML IM PRSY
PREFILLED_SYRINGE | INTRAMUSCULAR | Status: AC
  Filled 2019-04-09: qty 0.5

## 2019-04-09 MED ORDER — SODIUM CHLORIDE 0.9% FLUSH
10.0000 mL | INTRAVENOUS | Status: DC | PRN
  Administered 2019-04-09: 10 mL
  Filled 2019-04-09: qty 10

## 2019-04-09 MED ORDER — ZOLEDRONIC ACID 4 MG/100ML IV SOLN
4.0000 mg | Freq: Once | INTRAVENOUS | Status: AC
  Administered 2019-04-09: 4 mg via INTRAVENOUS
  Filled 2019-04-09: qty 100

## 2019-04-09 MED ORDER — SODIUM CHLORIDE 0.9 % IV SOLN
Freq: Once | INTRAVENOUS | Status: AC
  Administered 2019-04-09: 11:00:00 via INTRAVENOUS
  Filled 2019-04-09: qty 250

## 2019-04-09 MED ORDER — ACETAMINOPHEN 325 MG PO TABS
650.0000 mg | ORAL_TABLET | Freq: Once | ORAL | Status: AC
  Administered 2019-04-09: 650 mg via ORAL

## 2019-04-09 MED ORDER — ACETAMINOPHEN 325 MG PO TABS
ORAL_TABLET | ORAL | Status: AC
  Filled 2019-04-09: qty 2

## 2019-04-09 MED ORDER — DEXAMETHASONE SODIUM PHOSPHATE 10 MG/ML IJ SOLN
10.0000 mg | Freq: Once | INTRAMUSCULAR | Status: AC
  Administered 2019-04-09: 10 mg via INTRAVENOUS

## 2019-04-09 NOTE — Assessment & Plan Note (Signed)
I recommend influenza vaccination today

## 2019-04-09 NOTE — Progress Notes (Signed)
[place] OFFICE PROGRESS NOTE  Patient Care Team: [name], [name], MD as PCP - General (Internal Medicine) [name], MD as PCP - Cardiology (Cardiology) [name], MD as Referring Physician (Hematology and Oncology) [name], MD as Consulting Physician (Internal Medicine) [name], [name], MD as Consulting Physician (Infectious Diseases) [name], MD as Consulting Physician (Hematology and Oncology) [name], NP as Nurse Practitioner (Hematology and Oncology)  ASSESSMENT & PLAN:  Multiple myeloma [facility]) The patient has achieved very good partial remission with intermittent detectable M spike She has appointment to follow-up at [facility]  She will continue daratumumab along with pomalidomide She has completed tapered off dexamethasone She will continue acyclovir for antimicrobial prophylaxis She will continue aspirin for DVT prophylaxis She will continue calcium with vitamin D and IV bisphosphonate every 3 months; she will be due today.  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   INTERVAL HISTORY: Please see below for problem oriented charting. She returns for further follow-up She is doing well She had a good visit with her daughter recently No new bone pain Denies recent infection, fever or chills She has negative dental evaluation recently  SUMMARY OF ONCOLOGIC HISTORY: Oncology History Overview Note  Multiple myeloma, Ig A  Lambda, M spike 3.54 grams, Calcium 9.2, Creatinine 0.8, Beta 2 microglobulin 4.52, IgA 4840 mg/dL, lambda light chain 75.4, albumin 3.6, hemoglobin 9.7, platelet 115    Primary site: Multiple Myeloma   Staging method: AJCC 6th Edition   Clinical: Stage IIA signed by [name], MD on 11/07/2013  2:46 PM   Summary: Stage IIA      Multiple myeloma ([place])  10/31/2013 Bone Marrow Biopsy   Bone marrow biopsy confirmed multiple myeloma with 40% bone marrow involvement. Skeletal survey showed minimal lesions in her score with generalized demineralization   11/10/2013 - 02/13/2014 Chemotherapy   The patient is started on induction chemotherapy with weekly dexamethasone 40 mg by mouth as well as Velcade subcutaneous injection on days 1, 4, 8 and 11. On 11/21/2013, she was started on monthly Zometa.   12/23/2013 Adverse Reaction   The dose of Velcade was reduced due to thrombocytopenia.   01/28/2014 - 04/07/2014 Chemotherapy   Revlimid is added. Treatment was discontinued due to lack of response.   02/24/2014 - 04/07/2014 Chemotherapy   Due to worsening peripheral neuropathy, Velcade injection is changed to once a week. Revlimid was given 21 days on, 7 days off.   04/07/2014 - 04/10/2014 Chemotherapy   Revlimid was discontinued due to lack of response. Chemotherapy was changed back to Velcade injection twice a week, 2 weeks on 1 week off. Her treatment was switched to to minimum response   04/20/2014 - 06/02/2014 Chemotherapy   chemotherapy is switched to Carfilzomib, Cytoxan and dexamethasone.   04/22/2014 Procedure   she has placement of port for chemotherapy.   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 at [facility]   10/20/2014 - 03/24/2015 Chemotherapy    she received chemotherapy with Kyprolis, Revlimid and dexamethasone   10/22/2014 Procedure   She has port placement   01/19/2015 Tumor Marker   IgA lambda M spike at 0.4 g    01/20/2015 Miscellaneous   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 No cource was found and fever resolved with IV vancomycin and meropenem   09/13/2015 Bone Marrow Biopsy   Bone marrow biopsy showed no increased blasts, 3-4 % plasma cells   03/02/2016 Bone Marrow Biopsy   Bone marrow biopsy at [facility] showed normocellular (30%) bone marrow with trilineage hematopoiesis. No significant increase in blasts. No significant increase in plasma cells.   05/12/2016 Imaging   DEXA scan at [place] showed osteopenia   10/24/2016 Imaging   Skeletal survey at [facility], no new lesions   12/07/2017 Imaging   No focal abnormality noted to suggest myeloma. Exam is stable from prior exam.   03/01/2018 Procedure   Successful 8 French right internal jugular vein power port placement with its tip at the SVC/RA junction.   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   10/31/2015 Miscellaneous   She has engrafted     REVIEW OF SYSTEMS:   Constitutional: Denies fevers, chills or abnormal weight loss Eyes: Denies blurriness of vision Ears, nose, mouth, throat, and face: Denies mucositis or sore throat Respiratory: Denies cough, dyspnea or wheezes Cardiovascular: Denies palpitation, chest discomfort or lower extremity swelling Gastrointestinal:  Denies nausea, heartburn or change in bowel habits Skin: Denies abnormal skin rashes Lymphatics: Denies new lymphadenopathy or easy bruising Neurological:Denies numbness, tingling or new weaknesses Behavioral/Psych: Mood is stable, no new changes  All other systems were reviewed with the patient and are negative.  I have reviewed the past medical history, past surgical history, social history and family history with the patient and they are unchanged from previous note.  ALLERGIES:  has No Known Allergies.  MEDICATIONS:  Current Outpatient Medications  Medication Sig Dispense Refill  . acyclovir (ZOVIRAX) 400 MG tablet Take 1 tablet (400 mg total) by  mouth 2 (two) times daily. 180 tablet 11  . aspirin 81 MG chewable tablet Chew 325 mg by mouth daily. Takes 4 tablets (325 mg daily) 21 days out of the month with chemo drug    . calcipotriene (DOVONOX) 0.005 % cream daily.     . calcium carbonate (TUMS - DOSED IN MG ELEMENTAL CALCIUM) 500 MG chewable tablet Chew 1 tablet by mouth daily.    . Cholecalciferol (VITAMIN D-1000 MAX ST) 1000  units tablet Take 1,000 Units by mouth daily.    . cyanocobalamin 1000 MCG tablet Take 1,000 mcg by mouth daily.    Marland Kitchen DARATUMUMAB IV Inject into the vein every 30 (thirty) days.    Marland Kitchen docusate sodium (COLACE) 100 MG capsule Take 100 mg by mouth daily as needed for mild constipation.     Marland Kitchen loperamide (IMODIUM) 2 MG capsule Take by mouth as needed for diarrhea or loose stools. As needed only    . metoprolol tartrate (LOPRESSOR) 25 MG tablet Take 1.5 tablets (37.5 mg total) by mouth 2 (two) times daily. 270 tablet 3  . mirtazapine (REMERON) 15 MG tablet TAKE 1 TABLET BY MOUTH EVERYDAY AT BEDTIME 90 tablet 1  . Multiple Vitamin (MULTIVITAMIN WITH MINERALS) TABS tablet Take 1 tablet by mouth daily.    . ondansetron (ZOFRAN) 8 MG tablet Take 1 tablet (8 mg total) by mouth every 8 (eight) hours as needed (Nausea or vomiting). 30 tablet 1  . pantoprazole (PROTONIX) 40 MG tablet Take 1 tablet (40 mg total) by mouth daily. Take 30-60 minutes before breakfast and dinner 180 tablet 2  . pomalidomide (POMALYST) 2 MG capsule Take 1 capsule (2 mg total) by mouth daily. Take with water on days 1-21. Repeat every 28 days. 21 capsule 0  . prochlorperazine (COMPAZINE) 10 MG tablet Take 1 tablet (10 mg total) by mouth every 6 (six) hours as needed (Nausea or vomiting). 30 tablet 1   No current facility-administered medications for this visit.     PHYSICAL EXAMINATION: ECOG PERFORMANCE STATUS: 0 - Asymptomatic  Vitals:   04/09/19 0958  BP: (!) 144/59  Pulse: 64  Resp: 18  Temp: 98.5 F (36.9 C)  SpO2: 97%   Filed Weights   04/09/19 0958  Weight: 165 lb 3.2 oz (74.9 kg)    GENERAL:alert, no distress and comfortable SKIN: skin color, texture, turgor are normal, no rashes or significant lesions EYES: normal, Conjunctiva are pink and non-injected, sclera clear OROPHARYNX:no exudate, no erythema and lips, buccal mucosa, and tongue normal  NECK: supple, thyroid normal size, non-tender, without  nodularity LYMPH:  no palpable lymphadenopathy in the cervical, axillary or inguinal LUNGS: clear to auscultation and percussion with normal breathing effort HEART: regular rate & rhythm and no murmurs and no lower extremity edema ABDOMEN:abdomen soft, non-tender and normal bowel sounds Musculoskeletal:no cyanosis of digits and no clubbing  NEURO: alert & oriented x 3 with fluent speech, no focal motor/sensory deficits  LABORATORY DATA:  I have reviewed the data as listed    Component Value Date/Time   NA 141 03/12/2019 0910   NA 142 01/04/2017 0902   K 4.2 03/12/2019 0910   K 3.9 01/04/2017 0902   CL 108 03/12/2019 0910   CO2 26 03/12/2019 0910   CO2 28 01/04/2017 0902   GLUCOSE 103 (H) 03/12/2019 0910   GLUCOSE 92 01/04/2017 0902   BUN 9 03/12/2019 0910   BUN 9.5 01/04/2017 0902   CREATININE 0.73 03/12/2019 0910   CREATININE 0.80 12/18/2018 0901  CREATININE 0.7 01/04/2017 0902   CALCIUM 8.7 (L) 03/12/2019 0910   CALCIUM 9.0 01/04/2017 0902   PROT 5.9 (L) 03/12/2019 0910   PROT 6.0 (L) 01/04/2017 0902   PROT 5.8 (L) 01/04/2017 0902   ALBUMIN 3.5 03/12/2019 0910   ALBUMIN 3.4 (L) 01/04/2017 0902   AST 13 (L) 03/12/2019 0910   AST 18 12/18/2018 0901   AST 21 01/04/2017 0902   ALT 10 03/12/2019 0910   ALT 14 12/18/2018 0901   ALT 16 01/04/2017 0902   ALKPHOS 70 03/12/2019 0910   ALKPHOS 101 01/04/2017 0902   BILITOT 0.2 (L) 03/12/2019 0910   BILITOT 0.3 12/18/2018 0901   BILITOT 0.56 01/04/2017 0902   GFRNONAA >60 03/12/2019 0910   GFRNONAA >60 12/18/2018 0901   GFRAA >60 03/12/2019 0910   GFRAA >60 12/18/2018 0901    No results found for: SPEP, UPEP  Lab Results  Component Value Date   WBC 5.1 04/09/2019   NEUTROABS 1.5 (L) 04/09/2019   HGB 12.5 04/09/2019   HCT 38.1 04/09/2019   MCV 108.5 (H) 04/09/2019   PLT 208 04/09/2019      Chemistry      Component Value Date/Time   NA 141 03/12/2019 0910   NA 142 01/04/2017 0902   K 4.2 03/12/2019 0910   K  3.9 01/04/2017 0902   CL 108 03/12/2019 0910   CO2 26 03/12/2019 0910   CO2 28 01/04/2017 0902   BUN 9 03/12/2019 0910   BUN 9.5 01/04/2017 0902   CREATININE 0.73 03/12/2019 0910   CREATININE 0.80 12/18/2018 0901   CREATININE 0.7 01/04/2017 0902      Component Value Date/Time   CALCIUM 8.7 (L) 03/12/2019 0910   CALCIUM 9.0 01/04/2017 0902   ALKPHOS 70 03/12/2019 0910   ALKPHOS 101 01/04/2017 0902   AST 13 (L) 03/12/2019 0910   AST 18 12/18/2018 0901   AST 21 01/04/2017 0902   ALT 10 03/12/2019 0910   ALT 14 12/18/2018 0901   ALT 16 01/04/2017 0902   BILITOT 0.2 (L) 03/12/2019 0910   BILITOT 0.3 12/18/2018 0901   BILITOT 0.56 01/04/2017 0902       All questions were answered. The patient knows to call the clinic with any problems, questions or concerns. No barriers to learning was detected.  I spent 15 minutes counseling the patient face to face. The total time spent in the appointment was 20 minutes and more than 50% was on counseling and review of test results  [name], MD 04/09/2019 10:32 AM

## 2019-04-09 NOTE — Assessment & Plan Note (Signed)
Overall, she is improving She is able to exercise and have lost some weight I encouraged her to continue exercise as tolerated

## 2019-04-09 NOTE — Assessment & Plan Note (Signed)
She is fully engrafted with no signs of pancytopenia

## 2019-04-09 NOTE — Patient Instructions (Addendum)
[facility] Discharge Instructions for Patients Receiving Chemotherapy  Today you received the following chemotherapy agents: Darzalex.  To help prevent nausea and vomiting after your treatment, we encourage you to take your nausea medication as directed.   If you develop nausea and vomiting that is not controlled by your nausea medication, call the clinic.   BELOW ARE SYMPTOMS THAT SHOULD BE REPORTED IMMEDIATELY:  *FEVER GREATER THAN 100.5 F  *CHILLS WITH OR WITHOUT FEVER  NAUSEA AND VOMITING THAT IS NOT CONTROLLED WITH YOUR NAUSEA MEDICATION  *UNUSUAL SHORTNESS OF BREATH  *UNUSUAL BRUISING OR BLEEDING  TENDERNESS IN MOUTH AND THROAT WITH OR WITHOUT PRESENCE OF ULCERS  *URINARY PROBLEMS  *BOWEL PROBLEMS  UNUSUAL RASH Items with * indicate a potential emergency and should be followed up as soon as possible.  Feel free to call the clinic should you have any questions or concerns. The clinic phone number is [phone].  Please show the CHEMO ALERT CARD at check-in to the Emergency Department and triage nurse.  Zoledronic Acid injection (Hypercalcemia, Oncology) What is this medicine? ZOLEDRONIC ACID (ZOE le dron ik AS id) lowers the amount of calcium loss from bone. It is used to treat too much calcium in your blood from cancer. It is also used to prevent complications of cancer that has spread to the bone. This medicine may be used for other purposes; ask your health care provider or pharmacist if you have questions. COMMON BRAND NAME(S): Zometa What should I tell my health care provider before I take this medicine? They need to know if you have any of these conditions:  aspirin-sensitive asthma  cancer, especially if you are receiving medicines used to treat cancer  dental disease or wear dentures  infection  kidney disease  receiving corticosteroids like dexamethasone or prednisone  an unusual or allergic reaction to zoledronic acid, other  medicines, foods, dyes, or preservatives  pregnant or trying to get pregnant  breast-feeding How should I use this medicine? This medicine is for infusion into a vein. It is given by a health care professional in a hospital or clinic setting. Talk to your pediatrician regarding the use of this medicine in children. Special care may be needed. Overdosage: If you think you have taken too much of this medicine contact a poison control center or emergency room at once. NOTE: This medicine is only for you. Do not share this medicine with others. What if I miss a dose? It is important not to miss your dose. Call your doctor or health care professional if you are unable to keep an appointment. What may interact with this medicine?  certain antibiotics given by injection  NSAIDs, medicines for pain and inflammation, like ibuprofen or naproxen  some diuretics like bumetanide, furosemide  teriparatide  thalidomide This list may not describe all possible interactions. Give your health care provider a list of all the medicines, herbs, non-prescription drugs, or dietary supplements you use. Also tell them if you smoke, drink alcohol, or use illegal drugs. Some items may interact with your medicine. What should I watch for while using this medicine? Visit your doctor or health care professional for regular checkups. It may be some time before you see the benefit from this medicine. Do not stop taking your medicine unless your doctor tells you to. Your doctor may order blood tests or other tests to see how you are doing. Women should inform their doctor if they wish to become pregnant or think they might be pregnant.   There is a potential for serious side effects to an unborn child. Talk to your health care professional or pharmacist for more information. You should make sure that you get enough calcium and vitamin D while you are taking this medicine. Discuss the foods you eat and the vitamins you take  with your health care professional. Some people who take this medicine have severe bone, joint, and/or muscle pain. This medicine may also increase your risk for jaw problems or a broken thigh bone. Tell your doctor right away if you have severe pain in your jaw, bones, joints, or muscles. Tell your doctor if you have any pain that does not go away or that gets worse. Tell your dentist and dental surgeon that you are taking this medicine. You should not have major dental surgery while on this medicine. See your dentist to have a dental exam and fix any dental problems before starting this medicine. Take good care of your teeth while on this medicine. Make sure you see your dentist for regular follow-up appointments. What side effects may I notice from receiving this medicine? Side effects that you should report to your doctor or health care professional as soon as possible:  allergic reactions like skin rash, itching or hives, swelling of the face, lips, or tongue  anxiety, confusion, or depression  breathing problems  changes in vision  eye pain  feeling faint or lightheaded, falls  jaw pain, especially after dental work  mouth sores  muscle cramps, stiffness, or weakness  redness, blistering, peeling or loosening of the skin, including inside the mouth  trouble passing urine or change in the amount of urine Side effects that usually do not require medical attention (report to your doctor or health care professional if they continue or are bothersome):  bone, joint, or muscle pain  constipation  diarrhea  fever  hair loss  irritation at site where injected  loss of appetite  nausea, vomiting  stomach upset  trouble sleeping  trouble swallowing  weak or tired This list may not describe all possible side effects. Call your doctor for medical advice about side effects. You may report side effects to FDA at 1-800-FDA-1088. Where should I keep my medicine? This drug  is given in a hospital or clinic and will not be stored at home. NOTE: This sheet is a summary. It may not cover all possible information. If you have questions about this medicine, talk to your doctor, pharmacist, or health care provider.  2020 Elsevier/Gold Standard (2013-11-08 14:19:39)  

## 2019-04-09 NOTE — Assessment & Plan Note (Signed)
The patient has achieved very good partial remission with intermittent detectable M spike She has appointment to follow-up at [facility]  She will continue daratumumab along with pomalidomide She has completed tapered off dexamethasone She will continue acyclovir for antimicrobial prophylaxis She will continue aspirin for DVT prophylaxis She will continue calcium with vitamin D and IV bisphosphonate every 3 months; she will be due today.  She has negative dental evaluation She will return to [facility] in January for repeat bone marrow aspirate and biopsy

## 2019-04-10 LAB — KAPPA/LAMBDA LIGHT CHAINS
Kappa free light chain: 19.3 mg/L (ref 3.3–19.4)
Kappa, lambda light chain ratio: 0.98 (ref 0.26–1.65)
Lambda free light chains: 19.7 mg/L (ref 5.7–26.3)

## 2019-04-11 ENCOUNTER — Telehealth: Payer: Self-pay | Admitting: *Deleted

## 2019-04-11 LAB — MULTIPLE MYELOMA PANEL, SERUM
Albumin SerPl Elph-Mcnc: 3.5 g/dL (ref 2.9–4.4)
Albumin/Glob SerPl: 1.6 (ref 0.7–1.7)
Alpha 1: 0.2 g/dL (ref 0.0–0.4)
Alpha2 Glob SerPl Elph-Mcnc: 0.8 g/dL (ref 0.4–1.0)
B-Globulin SerPl Elph-Mcnc: 0.8 g/dL (ref 0.7–1.3)
Gamma Glob SerPl Elph-Mcnc: 0.4 g/dL (ref 0.4–1.8)
Globulin, Total: 2.2 g/dL (ref 2.2–3.9)
IgA: 200 mg/dL (ref 64–422)
IgG (Immunoglobin G), Serum: 447 mg/dL — ABNORMAL LOW (ref 586–1602)
IgM (Immunoglobulin M), Srm: 57 mg/dL (ref 26–217)
Total Protein ELP: 5.7 g/dL — ABNORMAL LOW (ref 6.0–8.5)

## 2019-04-11 NOTE — Telephone Encounter (Signed)
-----   Message from [name], MD sent at 04/11/2019  2:06 PM EDT ----- Regarding: pls call her. her myeloma panel is normal this time

## 2019-04-11 NOTE — Telephone Encounter (Signed)
Telephone call to patient to advise lab results as directed below. Patient appreciated call. 

## 2019-04-23 ENCOUNTER — Telehealth: Payer: Self-pay

## 2019-04-23 NOTE — Telephone Encounter (Signed)
Oral Oncology Patient Advocate Encounter  Pomalyst patient assistance with [employer] will expire 06/26/19.  Met patient in the Lobby to complete the renewal application for [employer] in an effort to reduce patient's out of pocket expense for Pomlayst to $0 for 2021.  The application is complete and ready to be faxed with the re-enrollment period starts   [facility] will continue to follow.  [place] [facility] [place] Phone [phone] Fax [phone] 04/23/2019    10:00 AM

## 2019-05-01 ENCOUNTER — Other Ambulatory Visit: Payer: Self-pay

## 2019-05-01 ENCOUNTER — Encounter: Payer: Self-pay | Admitting: Hematology and Oncology

## 2019-05-01 DIAGNOSIS — C9002 Multiple myeloma in relapse: Secondary | ICD-10-CM

## 2019-05-01 MED ORDER — POMALIDOMIDE 2 MG PO CAPS: 2.0000 mg | ORAL_CAPSULE | Freq: Every day | ORAL | 0 refills | Status: DC

## 2019-05-07 ENCOUNTER — Other Ambulatory Visit: Payer: Self-pay | Admitting: Hematology and Oncology

## 2019-05-07 ENCOUNTER — Other Ambulatory Visit: Payer: Self-pay

## 2019-05-07 ENCOUNTER — Inpatient Hospital Stay: Payer: Medicare HMO

## 2019-05-07 ENCOUNTER — Inpatient Hospital Stay: Payer: Medicare HMO | Attending: Hematology and Oncology

## 2019-05-07 ENCOUNTER — Telehealth: Payer: Self-pay | Admitting: Hematology and Oncology

## 2019-05-07 ENCOUNTER — Encounter: Payer: Self-pay | Admitting: Hematology and Oncology

## 2019-05-07 ENCOUNTER — Inpatient Hospital Stay (HOSPITAL_BASED_OUTPATIENT_CLINIC_OR_DEPARTMENT_OTHER): Payer: Medicare HMO | Admitting: Hematology and Oncology

## 2019-05-07 VITALS — BP 100/70 | HR 63 | Temp 99.0°F | Resp 18

## 2019-05-07 DIAGNOSIS — C9000 Multiple myeloma not having achieved remission: Secondary | ICD-10-CM | POA: Diagnosis not present

## 2019-05-07 DIAGNOSIS — R634 Abnormal weight loss: Secondary | ICD-10-CM | POA: Insufficient documentation

## 2019-05-07 DIAGNOSIS — Z9481 Bone marrow transplant status: Secondary | ICD-10-CM | POA: Diagnosis not present

## 2019-05-07 DIAGNOSIS — D696 Thrombocytopenia, unspecified: Secondary | ICD-10-CM | POA: Diagnosis not present

## 2019-05-07 DIAGNOSIS — Z79899 Other long term (current) drug therapy: Secondary | ICD-10-CM | POA: Diagnosis not present

## 2019-05-07 DIAGNOSIS — Z7982 Long term (current) use of aspirin: Secondary | ICD-10-CM | POA: Insufficient documentation

## 2019-05-07 DIAGNOSIS — C9002 Multiple myeloma in relapse: Secondary | ICD-10-CM | POA: Insufficient documentation

## 2019-05-07 DIAGNOSIS — Z9221 Personal history of antineoplastic chemotherapy: Secondary | ICD-10-CM | POA: Insufficient documentation

## 2019-05-07 DIAGNOSIS — R5381 Other malaise: Secondary | ICD-10-CM

## 2019-05-07 DIAGNOSIS — D469 Myelodysplastic syndrome, unspecified: Secondary | ICD-10-CM

## 2019-05-07 DIAGNOSIS — R142 Eructation: Secondary | ICD-10-CM

## 2019-05-07 DIAGNOSIS — R5383 Other fatigue: Secondary | ICD-10-CM | POA: Diagnosis not present

## 2019-05-07 DIAGNOSIS — Z5112 Encounter for antineoplastic immunotherapy: Secondary | ICD-10-CM | POA: Diagnosis present

## 2019-05-07 DIAGNOSIS — M8580 Other specified disorders of bone density and structure, unspecified site: Secondary | ICD-10-CM | POA: Diagnosis not present

## 2019-05-07 DIAGNOSIS — G629 Polyneuropathy, unspecified: Secondary | ICD-10-CM | POA: Insufficient documentation

## 2019-05-07 DIAGNOSIS — R12 Heartburn: Secondary | ICD-10-CM | POA: Diagnosis not present

## 2019-05-07 LAB — CBC WITH DIFFERENTIAL/PLATELET
Abs Immature Granulocytes: 0 10*3/uL (ref 0.00–0.07)
Basophils Absolute: 0 10*3/uL (ref 0.0–0.1)
Basophils Relative: 1 %
Eosinophils Absolute: 0 10*3/uL (ref 0.0–0.5)
Eosinophils Relative: 0 %
HCT: 38.6 % (ref 36.0–46.0)
Hemoglobin: 12.8 g/dL (ref 12.0–15.0)
Immature Granulocytes: 0 %
Lymphocytes Relative: 56 %
Lymphs Abs: 2.5 10*3/uL (ref 0.7–4.0)
MCH: 35.3 pg — ABNORMAL HIGH (ref 26.0–34.0)
MCHC: 33.2 g/dL (ref 30.0–36.0)
MCV: 106.3 fL — ABNORMAL HIGH (ref 80.0–100.0)
Monocytes Absolute: 0.6 10*3/uL (ref 0.1–1.0)
Monocytes Relative: 15 %
Neutro Abs: 1.2 10*3/uL — ABNORMAL LOW (ref 1.7–7.7)
Neutrophils Relative %: 28 %
Platelets: 187 10*3/uL (ref 150–400)
RBC: 3.63 MIL/uL — ABNORMAL LOW (ref 3.87–5.11)
RDW: 13.3 % (ref 11.5–15.5)
WBC: 4.4 10*3/uL (ref 4.0–10.5)
nRBC: 0 % (ref 0.0–0.2)

## 2019-05-07 LAB — COMPREHENSIVE METABOLIC PANEL
ALT: 12 U/L (ref 0–44)
AST: 12 U/L — ABNORMAL LOW (ref 15–41)
Albumin: 3.6 g/dL (ref 3.5–5.0)
Alkaline Phosphatase: 70 U/L (ref 38–126)
Anion gap: 11 (ref 5–15)
BUN: 12 mg/dL (ref 8–23)
CO2: 24 mmol/L (ref 22–32)
Calcium: 8.7 mg/dL — ABNORMAL LOW (ref 8.9–10.3)
Chloride: 107 mmol/L (ref 98–111)
Creatinine, Ser: 0.76 mg/dL (ref 0.44–1.00)
GFR calc Af Amer: >60 mL/min (ref >60)
GFR calc non Af Amer: >60 mL/min (ref >60)
Glucose, Bld: 101 mg/dL — ABNORMAL HIGH (ref 70–99)
Potassium: 4.1 mmol/L (ref 3.5–5.1)
Sodium: 142 mmol/L (ref 135–145)
Total Bilirubin: 0.3 mg/dL (ref 0.3–1.2)
Total Protein: 6.1 g/dL — ABNORMAL LOW (ref 6.5–8.1)

## 2019-05-07 MED ORDER — SODIUM CHLORIDE 0.9 % IV SOLN
Freq: Once | INTRAVENOUS | Status: AC
  Administered 2019-05-07: 10:00:00 via INTRAVENOUS
  Filled 2019-05-07: qty 250

## 2019-05-07 MED ORDER — PROCHLORPERAZINE MALEATE 10 MG PO TABS
10.0000 mg | ORAL_TABLET | Freq: Once | ORAL | Status: AC
  Administered 2019-05-07: 10 mg via ORAL

## 2019-05-07 MED ORDER — ACETAMINOPHEN 325 MG PO TABS
ORAL_TABLET | ORAL | Status: AC
  Filled 2019-05-07: qty 2

## 2019-05-07 MED ORDER — HEPARIN SOD (PORK) LOCK FLUSH 100 UNIT/ML IV SOLN
500.0000 [IU] | Freq: Once | INTRAVENOUS | Status: AC | PRN
  Administered 2019-05-07: 500 [IU]
  Filled 2019-05-07: qty 5

## 2019-05-07 MED ORDER — DIPHENHYDRAMINE HCL 25 MG PO CAPS
ORAL_CAPSULE | ORAL | Status: AC
  Filled 2019-05-07: qty 2

## 2019-05-07 MED ORDER — DEXAMETHASONE SODIUM PHOSPHATE 10 MG/ML IJ SOLN
10.0000 mg | Freq: Once | INTRAMUSCULAR | Status: AC
  Administered 2019-05-07: 10 mg via INTRAVENOUS

## 2019-05-07 MED ORDER — SODIUM CHLORIDE 0.9 % IV SOLN
16.0000 mg/kg | Freq: Once | INTRAVENOUS | Status: AC
  Administered 2019-05-07: 1200 mg via INTRAVENOUS
  Filled 2019-05-07: qty 60

## 2019-05-07 MED ORDER — DEXAMETHASONE SODIUM PHOSPHATE 10 MG/ML IJ SOLN
INTRAMUSCULAR | Status: AC
  Filled 2019-05-07: qty 1

## 2019-05-07 MED ORDER — PROCHLORPERAZINE MALEATE 10 MG PO TABS
ORAL_TABLET | ORAL | Status: AC
  Filled 2019-05-07: qty 1

## 2019-05-07 MED ORDER — DIPHENHYDRAMINE HCL 25 MG PO CAPS
50.0000 mg | ORAL_CAPSULE | Freq: Once | ORAL | Status: AC
  Administered 2019-05-07: 50 mg via ORAL

## 2019-05-07 MED ORDER — SODIUM CHLORIDE 0.9% FLUSH
10.0000 mL | Freq: Once | INTRAVENOUS | Status: AC
  Administered 2019-05-07: 09:00:00 10 mL
  Filled 2019-05-07: qty 10

## 2019-05-07 MED ORDER — ACETAMINOPHEN 325 MG PO TABS
650.0000 mg | ORAL_TABLET | Freq: Once | ORAL | Status: AC
  Administered 2019-05-07: 650 mg via ORAL

## 2019-05-07 MED ORDER — SODIUM CHLORIDE 0.9% FLUSH
10.0000 mL | INTRAVENOUS | Status: DC | PRN
  Administered 2019-05-07: 10 mL
  Filled 2019-05-07: qty 10

## 2019-05-07 NOTE — Telephone Encounter (Signed)
Scheduled appt per 11/11 sch message - pt to get an updated schedule in the treatment area after treatment .

## 2019-05-07 NOTE — Progress Notes (Signed)
Okay to treat with ANC of 1.2, per Dr. Alvy Bimler.

## 2019-05-07 NOTE — Assessment & Plan Note (Signed)
The patient has achieved very good partial remission with intermittent undetectable M spike She has appointment to follow-up at [facility] with repeat bone marrow biopsy in January 2021 I do not recommend repeat skeletal survey as I do not feel it will change management I think it is reasonable to consider repeating bone density scan  She will continue daratumumab along with pomalidomide She has completed tapered off dexamethasone She will continue acyclovir for antimicrobial prophylaxis She will continue aspirin for DVT prophylaxis She will continue calcium with vitamin D and IV bisphosphonate every 3 months; she had recent negative dental evaluation

## 2019-05-07 NOTE — Patient Instructions (Signed)
[facility] Discharge Instructions for Patients Receiving Chemotherapy  Today you received the following chemotherapy agents: Darzalex  To help prevent nausea and vomiting after your treatment, we encourage you to take your nausea medication as directed.    If you develop nausea and vomiting that is not controlled by your nausea medication, call the clinic.   BELOW ARE SYMPTOMS THAT SHOULD BE REPORTED IMMEDIATELY:  *FEVER GREATER THAN 100.5 F  *CHILLS WITH OR WITHOUT FEVER  NAUSEA AND VOMITING THAT IS NOT CONTROLLED WITH YOUR NAUSEA MEDICATION  *UNUSUAL SHORTNESS OF BREATH  *UNUSUAL BRUISING OR BLEEDING  TENDERNESS IN MOUTH AND THROAT WITH OR WITHOUT PRESENCE OF ULCERS  *URINARY PROBLEMS  *BOWEL PROBLEMS  UNUSUAL RASH Items with * indicate a potential emergency and should be followed up as soon as possible.  Feel free to call the clinic should you have any questions or concerns. The clinic phone number is [phone].  Please show the CHEMO ALERT CARD at check-in to the Emergency Department and triage nurse.   

## 2019-05-07 NOTE — Assessment & Plan Note (Signed)
Overall, she is still experiencing generalized fatigue and deconditioning She is able to exercise and have lost some weight I encouraged her to continue exercise as tolerated

## 2019-05-07 NOTE — Assessment & Plan Note (Signed)
She has symptoms of heartburn/reflux She has appointment to see GI service for further management She is taking calcium carbonate

## 2019-05-07 NOTE — Progress Notes (Signed)
[facility] OFFICE PROGRESS NOTE  Patient Care Team: [name], [name], MD as PCP - General (Internal Medicine) [name], MD as PCP - Cardiology (Cardiology) [name], MD as Referring Physician (Hematology and Oncology) [name], MD as Consulting Physician (Internal Medicine) [name], [name], MD as Consulting Physician (Infectious Diseases) [name], MD as Consulting Physician (Hematology and Oncology) [name], NP as Nurse Practitioner (Hematology and Oncology)  ASSESSMENT & PLAN:  Multiple myeloma [place]) The patient has achieved very good partial remission with intermittent undetectable M spike She has appointment to follow-up at [facility] with repeat bone marrow biopsy in January 2021 I do not recommend repeat skeletal survey as I do not feel it will change management I think it is reasonable to consider repeating bone density scan  She will continue daratumumab along with pomalidomide She has completed tapered off dexamethasone She will continue acyclovir for antimicrobial prophylaxis She will continue aspirin for DVT prophylaxis She will continue calcium with vitamin D and IV bisphosphonate every 3 months; she had recent negative dental evaluation  Physical deconditioning Overall, she is still experiencing generalized fatigue and deconditioning She is able to exercise and have lost some weight I encouraged her to continue exercise as tolerated  Belching She has symptoms of heartburn/reflux She has appointment to see GI service for further management She is taking calcium carbonate   No orders of the defined types were placed in this encounter.   INTERVAL HISTORY: Please see below for problem oriented charting. She returns for further follow-up She feels well She denies side effects from treatment No recent infection, fever or chills No new bone pain She has symptoms of reflux/belching/heartburn  and have appointment to see GI service She has symptoms of fatigue and insomnia despite exercising  SUMMARY OF ONCOLOGIC HISTORY: Oncology History Overview Note  Multiple myeloma, Ig A Lambda, M spike 3.54 grams, Calcium 9.2, Creatinine 0.8, Beta 2 microglobulin 4.52, IgA 4840 mg/dL, lambda light chain 75.4, albumin 3.6, hemoglobin 9.7, platelet 115    Primary site: Multiple Myeloma   Staging method: AJCC 6th Edition   Clinical: Stage IIA signed by [name], MD on 11/07/2013  2:46 PM   Summary: Stage IIA      Multiple myeloma ([place])  10/31/2013 Bone Marrow Biopsy   Bone marrow biopsy confirmed multiple myeloma with 40% bone marrow involvement. Skeletal survey showed minimal lesions in her score with generalized demineralization   11/10/2013 - 02/13/2014 Chemotherapy   The patient is started on induction chemotherapy with weekly dexamethasone 40 mg by mouth as well as Velcade subcutaneous injection on days 1, 4, 8 and 11. On 11/21/2013, she was started on monthly Zometa.   12/23/2013 Adverse Reaction   The dose of Velcade was reduced due to thrombocytopenia.   01/28/2014 - 04/07/2014 Chemotherapy   Revlimid is added. Treatment was discontinued due to lack of response.   02/24/2014 - 04/07/2014 Chemotherapy   Due to worsening peripheral neuropathy, Velcade injection is changed to once a week. Revlimid was given 21 days on, 7 days off.   04/07/2014 - 04/10/2014 Chemotherapy   Revlimid was discontinued due to lack of response. Chemotherapy was changed back to Velcade injection twice a week, 2 weeks on 1 week off. Her treatment was switched to to minimum response   04/20/2014 - 06/02/2014 Chemotherapy   chemotherapy is switched to Carfilzomib, Cytoxan and dexamethasone.   04/22/2014 Procedure   she has placement of port for chemotherapy.  06/01/2014 Tumor Marker   Bloodwork show that she has greater than partial response   06/23/2014 Bone Marrow Biopsy   Bone marrow biopsy show 5-10%  residual plasma cells, normal cytogenetics and FISH   07/07/2014 Procedure   She had stem cell collection   07/22/2014 - 07/22/2014 Chemotherapy   She had high-dose chemotherapy with melphalan   07/23/2014 Bone Marrow Transplant   She had bone marrow transplant in autologous fashion at [facility]   10/20/2014 - 03/24/2015 Chemotherapy    she received chemotherapy with Kyprolis, Revlimid and dexamethasone   10/22/2014 Procedure   She has port placement   01/19/2015 Tumor Marker   IgA lambda M spike at 0.4 g    01/20/2015 Miscellaneous   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 No cource was found and fever resolved with IV vancomycin and meropenem   09/13/2015 Bone Marrow Biopsy   Bone marrow biopsy showed no increased blasts, 3-4 % plasma cells   03/02/2016 Bone Marrow Biopsy   Bone marrow biopsy at [facility] showed normocellular (30%) bone marrow with trilineage hematopoiesis. No significant increase in blasts. No significant increase in plasma cells.   05/12/2016 Imaging   DEXA scan at [facility] showed osteopenia   10/24/2016 Imaging   Skeletal survey at [facility], no new lesions   12/07/2017 Imaging   No focal abnormality noted to suggest myeloma. Exam is stable from prior exam.   03/01/2018 Procedure   Successful 8 French right internal jugular vein power port placement with its tip at the SVC/RA junction.   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   10/31/2015 Miscellaneous   She has engrafted     REVIEW OF SYSTEMS:   Constitutional: Denies fevers, chills or abnormal weight loss Eyes: Denies blurriness of vision Ears, nose, mouth, throat, and face: Denies mucositis or sore throat Respiratory: Denies cough, dyspnea or wheezes Cardiovascular: Denies palpitation, chest discomfort or lower extremity swelling Skin: Denies abnormal skin rashes Lymphatics: Denies new lymphadenopathy or easy bruising Neurological:Denies numbness, tingling or new weaknesses Behavioral/Psych: Mood is stable, no new changes  All other systems were reviewed with the patient and are negative.  I have reviewed the past medical history, past surgical history, social history and family history with the patient and they are unchanged from previous note.  ALLERGIES:  has No Known Allergies.  MEDICATIONS:  Current  Outpatient Medications  Medication Sig Dispense Refill  . acyclovir (ZOVIRAX) 400 MG tablet Take 1 tablet (400 mg total) by mouth 2 (two) times daily. 180 tablet 11  . aspirin 81 MG chewable tablet Chew 325 mg by mouth daily. Takes 4 tablets (325 mg daily) 21 days out of the month with chemo drug    . calcipotriene (DOVONOX) 0.005 % cream daily.     . calcium carbonate (TUMS - DOSED IN MG ELEMENTAL  CALCIUM) 500 MG chewable tablet Chew 1 tablet by mouth daily.    . Cholecalciferol (VITAMIN D-1000 MAX ST) 1000 units tablet Take 1,000 Units by mouth daily.    . cyanocobalamin 1000 MCG tablet Take 1,000 mcg by mouth daily.    Marland Kitchen DARATUMUMAB IV Inject into the vein every 30 (thirty) days.    Marland Kitchen docusate sodium (COLACE) 100 MG capsule Take 100 mg by mouth daily as needed for mild constipation.     Marland Kitchen loperamide (IMODIUM) 2 MG capsule Take by mouth as needed for diarrhea or loose stools. As needed only    . metoprolol tartrate (LOPRESSOR) 25 MG tablet Take 1.5 tablets (37.5 mg total) by mouth 2 (two) times daily. 270 tablet 3  . mirtazapine (REMERON) 15 MG tablet TAKE 1 TABLET BY MOUTH EVERYDAY AT BEDTIME 90 tablet 1  . Multiple Vitamin (MULTIVITAMIN WITH MINERALS) TABS tablet Take 1 tablet by mouth daily.    . ondansetron (ZOFRAN) 8 MG tablet Take 1 tablet (8 mg total) by mouth every 8 (eight) hours as needed (Nausea or vomiting). 30 tablet 1  . pantoprazole (PROTONIX) 40 MG tablet Take 1 tablet (40 mg total) by mouth daily. Take 30-60 minutes before breakfast and dinner 180 tablet 2  . pomalidomide (POMALYST) 2 MG capsule Take 1 capsule (2 mg total) by mouth daily. Take with water on days 1-21. Repeat every 28 days. 21 capsule 0  . prochlorperazine (COMPAZINE) 10 MG tablet Take 1 tablet (10 mg total) by mouth every 6 (six) hours as needed (Nausea or vomiting). 30 tablet 1   No current facility-administered medications for this visit.     PHYSICAL EXAMINATION: ECOG PERFORMANCE STATUS: 1 - Symptomatic but completely ambulatory  Vitals:   05/07/19 0902  BP: 133/62  Pulse: 62  Resp: 17  Temp: 98.3 F (36.8 C)  SpO2: 100%   Filed Weights   05/07/19 0902  Weight: 162 lb 3.2 oz (73.6 kg)    GENERAL:alert, no distress and comfortable SKIN: skin color, texture, turgor are normal, no rashes or significant lesions EYES: normal, Conjunctiva are pink and non-injected, sclera clear OROPHARYNX:no  exudate, no erythema and lips, buccal mucosa, and tongue normal  NECK: supple, thyroid normal size, non-tender, without nodularity LYMPH:  no palpable lymphadenopathy in the cervical, axillary or inguinal LUNGS: clear to auscultation and percussion with normal breathing effort HEART: regular rate & rhythm and no murmurs and no lower extremity edema ABDOMEN:abdomen soft, non-tender and normal bowel sounds Musculoskeletal:no cyanosis of digits and no clubbing  NEURO: alert & oriented x 3 with fluent speech, no focal motor/sensory deficits  LABORATORY DATA:  I have reviewed the data as listed    Component Value Date/Time   NA 142 05/07/2019 0850   NA 142 01/04/2017 0902   K 4.1 05/07/2019 0850   K 3.9 01/04/2017 0902   CL 107 05/07/2019 0850   CO2 24 05/07/2019 0850   CO2 28 01/04/2017 0902   GLUCOSE 101 (H) 05/07/2019 0850   GLUCOSE 92 01/04/2017 0902  BUN 12 05/07/2019 0850   BUN 9.5 01/04/2017 0902   CREATININE 0.76 05/07/2019 0850   CREATININE 0.80 12/18/2018 0901   CREATININE 0.7 01/04/2017 0902   CALCIUM 8.7 (L) 05/07/2019 0850   CALCIUM 9.0 01/04/2017 0902   PROT 6.1 (L) 05/07/2019 0850   PROT 6.0 (L) 01/04/2017 0902   PROT 5.8 (L) 01/04/2017 0902   ALBUMIN 3.6 05/07/2019 0850   ALBUMIN 3.4 (L) 01/04/2017 0902   AST 12 (L) 05/07/2019 0850   AST 18 12/18/2018 0901   AST 21 01/04/2017 0902   ALT 12 05/07/2019 0850   ALT 14 12/18/2018 0901   ALT 16 01/04/2017 0902   ALKPHOS 70 05/07/2019 0850   ALKPHOS 101 01/04/2017 0902   BILITOT 0.3 05/07/2019 0850   BILITOT 0.3 12/18/2018 0901   BILITOT 0.56 01/04/2017 0902   GFRNONAA >60 05/07/2019 0850   GFRNONAA >60 12/18/2018 0901   GFRAA >60 05/07/2019 0850   GFRAA >60 12/18/2018 0901    No results found for: SPEP, UPEP  Lab Results  Component Value Date   WBC 4.4 05/07/2019   NEUTROABS 1.2 (L) 05/07/2019   HGB 12.8 05/07/2019   HCT 38.6 05/07/2019   MCV 106.3 (H) 05/07/2019   PLT 187 05/07/2019      Chemistry       Component Value Date/Time   NA 142 05/07/2019 0850   NA 142 01/04/2017 0902   K 4.1 05/07/2019 0850   K 3.9 01/04/2017 0902   CL 107 05/07/2019 0850   CO2 24 05/07/2019 0850   CO2 28 01/04/2017 0902   BUN 12 05/07/2019 0850   BUN 9.5 01/04/2017 0902   CREATININE 0.76 05/07/2019 0850   CREATININE 0.80 12/18/2018 0901   CREATININE 0.7 01/04/2017 0902      Component Value Date/Time   CALCIUM 8.7 (L) 05/07/2019 0850   CALCIUM 9.0 01/04/2017 0902   ALKPHOS 70 05/07/2019 0850   ALKPHOS 101 01/04/2017 0902   AST 12 (L) 05/07/2019 0850   AST 18 12/18/2018 0901   AST 21 01/04/2017 0902   ALT 12 05/07/2019 0850   ALT 14 12/18/2018 0901   ALT 16 01/04/2017 0902   BILITOT 0.3 05/07/2019 0850   BILITOT 0.3 12/18/2018 0901   BILITOT 0.56 01/04/2017 0902     All questions were answered. The patient knows to call the clinic with any problems, questions or concerns. No barriers to learning was detected.  I spent 15 minutes counseling the patient face to face. The total time spent in the appointment was 20 minutes and more than 50% was on counseling and review of test results  [name], MD 05/07/2019 9:48 AM

## 2019-05-08 LAB — KAPPA/LAMBDA LIGHT CHAINS
Kappa free light chain: 15.5 mg/L (ref 3.3–19.4)
Kappa, lambda light chain ratio: 0.76 (ref 0.26–1.65)
Lambda free light chains: 20.5 mg/L (ref 5.7–26.3)

## 2019-05-10 LAB — MULTIPLE MYELOMA PANEL, SERUM
Albumin SerPl Elph-Mcnc: 3.4 g/dL (ref 2.9–4.4)
Albumin/Glob SerPl: 1.5 (ref 0.7–1.7)
Alpha 1: 0.2 g/dL (ref 0.0–0.4)
Alpha2 Glob SerPl Elph-Mcnc: 0.8 g/dL (ref 0.4–1.0)
B-Globulin SerPl Elph-Mcnc: 0.7 g/dL (ref 0.7–1.3)
Gamma Glob SerPl Elph-Mcnc: 0.5 g/dL (ref 0.4–1.8)
Globulin, Total: 2.3 g/dL (ref 2.2–3.9)
IgA: 203 mg/dL (ref 64–422)
IgG (Immunoglobin G), Serum: 485 mg/dL — ABNORMAL LOW (ref 586–1602)
IgM (Immunoglobulin M), Srm: 55 mg/dL (ref 26–217)
Total Protein ELP: 5.7 g/dL — ABNORMAL LOW (ref 6.0–8.5)

## 2019-05-12 ENCOUNTER — Telehealth: Payer: Self-pay

## 2019-05-12 ENCOUNTER — Encounter: Payer: Self-pay | Admitting: Hematology and Oncology

## 2019-05-12 NOTE — Telephone Encounter (Signed)
Called and given below message. She verbalized understanding. 

## 2019-05-12 NOTE — Telephone Encounter (Signed)
-----   Message from [name], MD sent at 05/12/2019  8:28 AM EST ----- Regarding: pls let her know myeloma panel is stable

## 2019-05-15 DIAGNOSIS — D2262 Melanocytic nevi of left upper limb, including shoulder: Secondary | ICD-10-CM | POA: Diagnosis not present

## 2019-05-15 DIAGNOSIS — D225 Melanocytic nevi of trunk: Secondary | ICD-10-CM | POA: Diagnosis not present

## 2019-05-15 DIAGNOSIS — D485 Neoplasm of uncertain behavior of skin: Secondary | ICD-10-CM | POA: Diagnosis not present

## 2019-05-15 DIAGNOSIS — L590 Erythema ab igne [dermatitis ab igne]: Secondary | ICD-10-CM | POA: Diagnosis not present

## 2019-05-15 DIAGNOSIS — D0439 Carcinoma in situ of skin of other parts of face: Secondary | ICD-10-CM | POA: Diagnosis not present

## 2019-05-15 DIAGNOSIS — Z23 Encounter for immunization: Secondary | ICD-10-CM | POA: Diagnosis not present

## 2019-05-15 DIAGNOSIS — L658 Other specified nonscarring hair loss: Secondary | ICD-10-CM | POA: Diagnosis not present

## 2019-05-16 ENCOUNTER — Encounter: Payer: Self-pay | Admitting: Hematology and Oncology

## 2019-05-23 ENCOUNTER — Other Ambulatory Visit: Payer: Self-pay

## 2019-05-23 DIAGNOSIS — C9002 Multiple myeloma in relapse: Secondary | ICD-10-CM

## 2019-05-23 MED ORDER — POMALIDOMIDE 2 MG PO CAPS: 2.0000 mg | ORAL_CAPSULE | Freq: Every day | ORAL | 0 refills | Status: DC

## 2019-05-26 DIAGNOSIS — M8580 Other specified disorders of bone density and structure, unspecified site: Secondary | ICD-10-CM | POA: Diagnosis not present

## 2019-05-26 DIAGNOSIS — Z780 Asymptomatic menopausal state: Secondary | ICD-10-CM | POA: Diagnosis not present

## 2019-05-26 DIAGNOSIS — M810 Age-related osteoporosis without current pathological fracture: Secondary | ICD-10-CM | POA: Diagnosis not present

## 2019-06-02 NOTE — Telephone Encounter (Signed)
Attempted to call patient. LMTCB 06/02/2019   

## 2019-06-02 NOTE — Telephone Encounter (Signed)
Advised pt it would be best to make appt with Dr.Arida to discuss sx further.   Appt confirmed with patient.   No further questions at this time.   Advised pt to call for any further questions or concerns or for new or worsening sx.

## 2019-06-04 ENCOUNTER — Inpatient Hospital Stay: Payer: Medicare HMO | Admitting: Hematology and Oncology

## 2019-06-04 ENCOUNTER — Other Ambulatory Visit: Payer: Self-pay

## 2019-06-04 ENCOUNTER — Encounter: Payer: Self-pay | Admitting: Hematology and Oncology

## 2019-06-04 ENCOUNTER — Inpatient Hospital Stay: Payer: Medicare HMO

## 2019-06-04 ENCOUNTER — Inpatient Hospital Stay: Payer: Medicare HMO | Attending: Hematology and Oncology

## 2019-06-04 ENCOUNTER — Other Ambulatory Visit: Payer: Self-pay | Admitting: Hematology and Oncology

## 2019-06-04 VITALS — BP 132/71 | HR 65 | Temp 98.9°F | Resp 16

## 2019-06-04 DIAGNOSIS — Z79899 Other long term (current) drug therapy: Secondary | ICD-10-CM | POA: Diagnosis not present

## 2019-06-04 DIAGNOSIS — C44329 Squamous cell carcinoma of skin of other parts of face: Secondary | ICD-10-CM | POA: Insufficient documentation

## 2019-06-04 DIAGNOSIS — D696 Thrombocytopenia, unspecified: Secondary | ICD-10-CM | POA: Diagnosis not present

## 2019-06-04 DIAGNOSIS — D701 Agranulocytosis secondary to cancer chemotherapy: Secondary | ICD-10-CM | POA: Insufficient documentation

## 2019-06-04 DIAGNOSIS — K219 Gastro-esophageal reflux disease without esophagitis: Secondary | ICD-10-CM | POA: Insufficient documentation

## 2019-06-04 DIAGNOSIS — Z9221 Personal history of antineoplastic chemotherapy: Secondary | ICD-10-CM | POA: Diagnosis not present

## 2019-06-04 DIAGNOSIS — Z9481 Bone marrow transplant status: Secondary | ICD-10-CM | POA: Insufficient documentation

## 2019-06-04 DIAGNOSIS — Z7982 Long term (current) use of aspirin: Secondary | ICD-10-CM | POA: Insufficient documentation

## 2019-06-04 DIAGNOSIS — C9000 Multiple myeloma not having achieved remission: Secondary | ICD-10-CM

## 2019-06-04 DIAGNOSIS — C9002 Multiple myeloma in relapse: Secondary | ICD-10-CM | POA: Insufficient documentation

## 2019-06-04 DIAGNOSIS — D469 Myelodysplastic syndrome, unspecified: Secondary | ICD-10-CM

## 2019-06-04 DIAGNOSIS — T451X5A Adverse effect of antineoplastic and immunosuppressive drugs, initial encounter: Secondary | ICD-10-CM | POA: Diagnosis not present

## 2019-06-04 DIAGNOSIS — G4700 Insomnia, unspecified: Secondary | ICD-10-CM | POA: Diagnosis not present

## 2019-06-04 DIAGNOSIS — Z5112 Encounter for antineoplastic immunotherapy: Secondary | ICD-10-CM | POA: Diagnosis present

## 2019-06-04 LAB — CBC WITH DIFFERENTIAL/PLATELET
Abs Immature Granulocytes: 0 10*3/uL (ref 0.00–0.07)
Basophils Absolute: 0 10*3/uL (ref 0.0–0.1)
Basophils Relative: 1 %
Eosinophils Absolute: 0 10*3/uL (ref 0.0–0.5)
Eosinophils Relative: 0 %
HCT: 38.2 % (ref 36.0–46.0)
Hemoglobin: 12.6 g/dL (ref 12.0–15.0)
Immature Granulocytes: 0 %
Lymphocytes Relative: 55 %
Lymphs Abs: 2.2 10*3/uL (ref 0.7–4.0)
MCH: 34.9 pg — ABNORMAL HIGH (ref 26.0–34.0)
MCHC: 33 g/dL (ref 30.0–36.0)
MCV: 105.8 fL — ABNORMAL HIGH (ref 80.0–100.0)
Monocytes Absolute: 0.6 10*3/uL (ref 0.1–1.0)
Monocytes Relative: 16 %
Neutro Abs: 1.1 10*3/uL — ABNORMAL LOW (ref 1.7–7.7)
Neutrophils Relative %: 28 %
Platelets: 180 10*3/uL (ref 150–400)
RBC: 3.61 MIL/uL — ABNORMAL LOW (ref 3.87–5.11)
RDW: 13.2 % (ref 11.5–15.5)
WBC: 3.9 10*3/uL — ABNORMAL LOW (ref 4.0–10.5)
nRBC: 0 % (ref 0.0–0.2)

## 2019-06-04 LAB — COMPREHENSIVE METABOLIC PANEL
ALT: 12 U/L (ref 0–44)
AST: 13 U/L — ABNORMAL LOW (ref 15–41)
Albumin: 3.7 g/dL (ref 3.5–5.0)
Alkaline Phosphatase: 70 U/L (ref 38–126)
Anion gap: 7 (ref 5–15)
BUN: 12 mg/dL (ref 8–23)
CO2: 26 mmol/L (ref 22–32)
Calcium: 8.8 mg/dL — ABNORMAL LOW (ref 8.9–10.3)
Chloride: 107 mmol/L (ref 98–111)
Creatinine, Ser: 0.75 mg/dL (ref 0.44–1.00)
GFR calc Af Amer: >60 mL/min (ref >60)
GFR calc non Af Amer: >60 mL/min (ref >60)
Glucose, Bld: 90 mg/dL (ref 70–99)
Potassium: 4.2 mmol/L (ref 3.5–5.1)
Sodium: 140 mmol/L (ref 135–145)
Total Bilirubin: 0.3 mg/dL (ref 0.3–1.2)
Total Protein: 6.1 g/dL — ABNORMAL LOW (ref 6.5–8.1)

## 2019-06-04 MED ORDER — PROCHLORPERAZINE MALEATE 10 MG PO TABS
10.0000 mg | ORAL_TABLET | Freq: Once | ORAL | Status: AC
  Administered 2019-06-04: 10 mg via ORAL

## 2019-06-04 MED ORDER — SODIUM CHLORIDE 0.9 % IV SOLN
16.0000 mg/kg | Freq: Once | INTRAVENOUS | Status: AC
  Administered 2019-06-04: 1200 mg via INTRAVENOUS
  Filled 2019-06-04: qty 60

## 2019-06-04 MED ORDER — DIPHENHYDRAMINE HCL 25 MG PO CAPS
50.0000 mg | ORAL_CAPSULE | Freq: Once | ORAL | Status: AC
  Administered 2019-06-04: 25 mg via ORAL

## 2019-06-04 MED ORDER — SODIUM CHLORIDE 0.9 % IV SOLN
Freq: Once | INTRAVENOUS | Status: AC
  Administered 2019-06-04: 10:00:00 via INTRAVENOUS
  Filled 2019-06-04: qty 250

## 2019-06-04 MED ORDER — HEPARIN SOD (PORK) LOCK FLUSH 100 UNIT/ML IV SOLN
500.0000 [IU] | Freq: Once | INTRAVENOUS | Status: AC | PRN
  Administered 2019-06-04: 500 [IU]
  Filled 2019-06-04: qty 5

## 2019-06-04 MED ORDER — SODIUM CHLORIDE 0.9% FLUSH
10.0000 mL | Freq: Once | INTRAVENOUS | Status: AC | PRN
  Administered 2019-06-04: 10 mL
  Filled 2019-06-04: qty 10

## 2019-06-04 MED ORDER — SODIUM CHLORIDE 0.9% FLUSH
10.0000 mL | INTRAVENOUS | Status: DC | PRN
  Administered 2019-06-04: 10 mL
  Filled 2019-06-04: qty 10

## 2019-06-04 MED ORDER — PROCHLORPERAZINE MALEATE 10 MG PO TABS
ORAL_TABLET | ORAL | Status: AC
  Filled 2019-06-04: qty 1

## 2019-06-04 MED ORDER — DEXAMETHASONE SODIUM PHOSPHATE 10 MG/ML IJ SOLN
INTRAMUSCULAR | Status: AC
  Filled 2019-06-04: qty 1

## 2019-06-04 MED ORDER — DEXAMETHASONE SODIUM PHOSPHATE 10 MG/ML IJ SOLN
10.0000 mg | Freq: Once | INTRAMUSCULAR | Status: AC
  Administered 2019-06-04: 10 mg via INTRAVENOUS

## 2019-06-04 MED ORDER — ACETAMINOPHEN 325 MG PO TABS
650.0000 mg | ORAL_TABLET | Freq: Once | ORAL | Status: AC
  Administered 2019-06-04: 650 mg via ORAL

## 2019-06-04 MED ORDER — DIPHENHYDRAMINE HCL 25 MG PO CAPS
ORAL_CAPSULE | ORAL | Status: AC
  Filled 2019-06-04: qty 1

## 2019-06-04 MED ORDER — ACETAMINOPHEN 325 MG PO TABS
ORAL_TABLET | ORAL | Status: AC
  Filled 2019-06-04: qty 2

## 2019-06-04 NOTE — Assessment & Plan Note (Signed)
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

## 2019-06-04 NOTE — Assessment & Plan Note (Signed)
She continues to have disordered sleep pattern despite being on Remeron On further questioning, it appears that the patient has not focus on adequate sleep hygiene We have discussion about conservative approach

## 2019-06-04 NOTE — Assessment & Plan Note (Addendum)
This is likely due to recent treatment. The patient denies recent history of fevers, cough, chills, diarrhea or dysuria. She is asymptomatic from the leukopenia. I will observe for now.  I will continue the chemotherapy at current dose without dosage adjustment.  If the leukopenia gets progressive worse in the future, I might have to delay her treatment or adjust the chemotherapy dose. We discussed neutropenic precautions

## 2019-06-04 NOTE — Patient Instructions (Signed)

## 2019-06-04 NOTE — Patient Instructions (Signed)
[name] Discharge Instructions for Patients Receiving Chemotherapy  Today you received the following chemotherapy agents: Darzalex  To help prevent nausea and vomiting after your treatment, we encourage you to take your nausea medication as directed.    If you develop nausea and vomiting that is not controlled by your nausea medication, call the clinic.   BELOW ARE SYMPTOMS THAT SHOULD BE REPORTED IMMEDIATELY:  *FEVER GREATER THAN 100.5 F  *CHILLS WITH OR WITHOUT FEVER  NAUSEA AND VOMITING THAT IS NOT CONTROLLED WITH YOUR NAUSEA MEDICATION  *UNUSUAL SHORTNESS OF BREATH  *UNUSUAL BRUISING OR BLEEDING  TENDERNESS IN MOUTH AND THROAT WITH OR WITHOUT PRESENCE OF ULCERS  *URINARY PROBLEMS  *BOWEL PROBLEMS  UNUSUAL RASH Items with * indicate a potential emergency and should be followed up as soon as possible.  Feel free to call the clinic should you have any questions or concerns. The clinic phone number is [phone].  Please show the CHEMO ALERT CARD at check-in to the Emergency Department and triage nurse.   

## 2019-06-04 NOTE — Progress Notes (Signed)
[place] OFFICE PROGRESS NOTE  Patient Care Team: [name], [name], MD as PCP - General (Internal Medicine) [name], MD as PCP - Cardiology (Cardiology) [name], MD as Referring Physician (Hematology and Oncology) [name], MD as Consulting Physician (Internal Medicine) [name], [name], MD as Consulting Physician (Infectious Diseases) [name], MD as Consulting Physician (Hematology and Oncology) [name], NP as Nurse Practitioner (Hematology and Oncology)  ASSESSMENT & PLAN:  Multiple myeloma [facility]) The patient has achieved very good partial remission with intermittent undetectable M spike She has appointment to follow-up at [facility] with repeat bone marrow biopsy in January 2021 I do not recommend repeat skeletal survey as I do not feel it will change management  She will continue daratumumab along with pomalidomide She has completed tapered off dexamethasone She will continue acyclovir for antimicrobial prophylaxis She will continue aspirin for DVT prophylaxis Her recent bone density scan is excellent.  I recommend she discuss this with her transplant physician next month My recommendation would be to discontinue Zometa  Leukopenia due to antineoplastic chemotherapy [facility]) This is likely due to recent treatment. The patient denies recent history of fevers, cough, chills, diarrhea or dysuria. She is asymptomatic from the leukopenia. I will observe for now.  I will continue the chemotherapy at current dose without dosage adjustment.  If the leukopenia gets progressive worse in the future, I might have to delay her treatment or adjust the chemotherapy dose. 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   INTERVAL HISTORY: Please see below for problem oriented charting. She returns for treatment and follow-up She complains of persistent GERD symptoms and insomnia She was recently diagnosed with skin cancer on the left temple She is due to return back to dermatologist for skin excision on her back No recent infection, fever or chills She tolerated chemotherapy well without side effects She has appointment pending to return to [place] next month for repeat bone marrow aspirate and biopsy  SUMMARY OF ONCOLOGIC HISTORY: Oncology History Overview Note  Multiple myeloma, Ig A Lambda, M spike 3.54 grams, Calcium 9.2, Creatinine 0.8, Beta 2 microglobulin 4.52, IgA 4840 mg/dL, lambda light chain 75.4, albumin 3.6, hemoglobin 9.7, platelet 115    Primary site: Multiple Myeloma   Staging method: AJCC 6th Edition   Clinical: Stage IIA signed by [name], MD on 11/07/2013  2:46 PM   Summary: Stage IIA      Multiple myeloma ([place])  10/31/2013 Bone Marrow Biopsy   Bone marrow biopsy confirmed multiple myeloma with 40% bone marrow involvement. Skeletal survey showed minimal lesions in her score with generalized demineralization   11/10/2013 - 02/13/2014 Chemotherapy   The patient is started on induction chemotherapy with weekly dexamethasone 40 mg by mouth as well as Velcade subcutaneous injection on days 1, 4, 8 and 11. On 11/21/2013, she was started on monthly  Zometa.   12/23/2013 Adverse Reaction   The dose of Velcade was reduced due to thrombocytopenia.    01/28/2014 - 04/07/2014 Chemotherapy   Revlimid is added. Treatment was discontinued due to lack of response.   02/24/2014 - 04/07/2014 Chemotherapy   Due to worsening peripheral neuropathy, Velcade injection is changed to once a week. Revlimid was given 21 days on, 7 days off.   04/07/2014 - 04/10/2014 Chemotherapy   Revlimid was discontinued due to lack of response. Chemotherapy was changed back to Velcade injection twice a week, 2 weeks on 1 week off. Her treatment was switched to to minimum response   04/20/2014 - 06/02/2014 Chemotherapy   chemotherapy is switched to Carfilzomib, Cytoxan and dexamethasone.   04/22/2014 Procedure   she has placement of port for chemotherapy.   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 at [facility]   10/20/2014 - 03/24/2015 Chemotherapy    she received chemotherapy with Kyprolis, Revlimid and dexamethasone   10/22/2014 Procedure   She has port placement   01/19/2015 Tumor Marker   IgA lambda M spike at 0.4 g    01/20/2015 Miscellaneous   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 No cource was found and fever resolved with IV  vancomycin and meropenem   09/13/2015 Bone Marrow Biopsy   Bone marrow biopsy showed no increased blasts, 3-4 % plasma cells   03/02/2016 Bone Marrow Biopsy   Bone marrow biopsy at [facility] showed normocellular (30%) bone marrow with trilineage hematopoiesis. No significant increase in blasts. No significant increase in plasma cells.   05/12/2016 Imaging   DEXA scan at [place] showed osteopenia   10/24/2016 Imaging   Skeletal survey at [facility], no new lesions   12/07/2017 Imaging   No focal abnormality noted to suggest myeloma. Exam is stable from prior exam.   03/01/2018 Procedure   Successful 8 French right internal jugular vein power port placement with its tip at the SVC/RA junction.   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   10/31/2015 Miscellaneous   She has engrafted     REVIEW OF SYSTEMS:   Constitutional: Denies fevers, chills or abnormal weight loss Eyes: Denies blurriness of vision Ears, nose, mouth, throat, and face: Denies mucositis or sore throat Respiratory: Denies cough, dyspnea or wheezes Cardiovascular: Denies palpitation, chest discomfort or lower extremity swelling Lymphatics: Denies new lymphadenopathy or easy  bruising Neurological:Denies numbness, tingling or new weaknesses Behavioral/Psych: Mood is stable, no new changes  All other systems were reviewed with the patient and are negative.  I have reviewed the past medical history, past surgical history, social history and family history with the patient and they are unchanged from previous note.  ALLERGIES:  has No Known Allergies.  MEDICATIONS:  Current Outpatient Medications  Medication Sig Dispense Refill  . acyclovir (ZOVIRAX) 400 MG tablet Take 1 tablet (400 mg total) by mouth 2 (two) times daily. 180 tablet 11  . aspirin 81 MG chewable tablet Chew 325 mg by mouth daily. Takes 4 tablets (325 mg daily) 21 days out of the month with chemo drug    . calcipotriene (DOVONOX) 0.005 % cream daily.     . calcium carbonate (TUMS - DOSED IN MG ELEMENTAL CALCIUM) 500 MG chewable tablet Chew 1 tablet by mouth daily.    . Cholecalciferol (VITAMIN D-1000 MAX ST) 1000 units tablet Take 1,000 Units by mouth daily.    . cyanocobalamin 1000 MCG tablet Take 1,000 mcg by mouth daily.    Marland Kitchen DARATUMUMAB IV Inject into the vein every 30 (thirty) days.    Marland Kitchen docusate sodium (COLACE) 100 MG capsule Take 100 mg by mouth daily as needed for mild constipation.     Marland Kitchen loperamide (IMODIUM) 2 MG capsule Take by mouth as needed for diarrhea or loose stools. As needed only    . metoprolol tartrate (LOPRESSOR) 25 MG tablet Take 1.5 tablets (37.5 mg total) by mouth 2 (two) times daily. 270 tablet 3  . mirtazapine (REMERON) 15 MG tablet TAKE 1 TABLET BY MOUTH EVERYDAY AT BEDTIME 90 tablet 1  . Multiple Vitamin (MULTIVITAMIN WITH MINERALS) TABS tablet Take 1 tablet by mouth daily.    . ondansetron (ZOFRAN) 8 MG tablet Take 1 tablet (8 mg total) by mouth every 8 (eight) hours as needed (Nausea or vomiting). 30 tablet 1  . pantoprazole (PROTONIX) 40 MG tablet Take 1 tablet (40 mg total) by mouth daily. Take 30-60 minutes before breakfast and dinner 180 tablet 2  . pomalidomide  (POMALYST) 2 MG capsule Take 1 capsule (2 mg total) by mouth daily. Take with water on days 1-21. Repeat every 28 days. 21 capsule 0  . prochlorperazine (COMPAZINE) 10 MG tablet Take 1 tablet (10 mg total) by mouth every 6 (six) hours as needed (Nausea or vomiting). 30 tablet 1   No current facility-administered medications for this visit.    Facility-Administered Medications Ordered in Other Visits  Medication Dose Route Frequency Provider Last Rate Last Dose  . daratumumab (DARZALEX) 1,200 mg in sodium chloride 0.9 % 440 mL chemo infusion  16 mg/kg (Treatment Plan Recorded) Intravenous Once Alvy Bimler, Quynn Vilchis, MD      . dexamethasone (DECADRON) injection 10 mg  10 mg Intravenous Once Alvy Bimler, Mikele Sifuentes, MD   10 mg at 06/04/19 0947  . heparin lock flush 100 unit/mL  500 Units Intracatheter Once PRN Alvy Bimler, Floyd Wade, MD      . sodium chloride flush (NS) 0.9 % injection 10 mL  10 mL Intracatheter PRN  [name], MD        PHYSICAL EXAMINATION: ECOG PERFORMANCE STATUS: 1 - Symptomatic but completely ambulatory  Vitals:   06/04/19 0907  BP: (!) 139/50  Pulse: (!) 58  Resp: 18  Temp: 97.8 F (36.6 C)  SpO2: 100%   Filed Weights   06/04/19 0907  Weight: 165 lb 3.2 oz (74.9 kg)    GENERAL:alert, no distress and comfortable SKIN: skin color, texture, turgor are normal, no rashes or significant lesions EYES: normal, Conjunctiva are pink and non-injected, sclera clear OROPHARYNX:no exudate, no erythema and lips, buccal mucosa, and tongue normal  NECK: supple, thyroid normal size, non-tender, without nodularity LYMPH:  no palpable lymphadenopathy in the cervical, axillary or inguinal LUNGS: clear to auscultation and percussion with normal breathing effort HEART: regular rate & rhythm and no murmurs and no lower extremity edema ABDOMEN:abdomen soft, non-tender and normal bowel sounds Musculoskeletal:no cyanosis of digits and no clubbing  NEURO: alert & oriented x 3 with fluent speech, no focal  motor/sensory deficits  LABORATORY DATA:  I have reviewed the data as listed    Component Value Date/Time   NA 140 06/04/2019 0855   NA 142 01/04/2017 0902   K 4.2 06/04/2019 0855   K 3.9 01/04/2017 0902   CL 107 06/04/2019 0855   CO2 26 06/04/2019 0855   CO2 28 01/04/2017 0902   GLUCOSE 90 06/04/2019 0855   GLUCOSE 92 01/04/2017 0902   BUN 12 06/04/2019 0855   BUN 9.5 01/04/2017 0902   CREATININE 0.75 06/04/2019 0855   CREATININE 0.80 12/18/2018 0901   CREATININE 0.7 01/04/2017 0902   CALCIUM 8.8 (L) 06/04/2019 0855   CALCIUM 9.0 01/04/2017 0902   PROT 6.1 (L) 06/04/2019 0855   PROT 6.0 (L) 01/04/2017 0902   PROT 5.8 (L) 01/04/2017 0902   ALBUMIN 3.7 06/04/2019 0855   ALBUMIN 3.4 (L) 01/04/2017 0902   AST 13 (L) 06/04/2019 0855   AST 18 12/18/2018 0901   AST 21 01/04/2017 0902   ALT 12 06/04/2019 0855   ALT 14 12/18/2018 0901   ALT 16 01/04/2017 0902   ALKPHOS 70 06/04/2019 0855   ALKPHOS 101 01/04/2017 0902   BILITOT 0.3 06/04/2019 0855   BILITOT 0.3 12/18/2018 0901   BILITOT 0.56 01/04/2017 0902   GFRNONAA >60 06/04/2019 0855   GFRNONAA >60 12/18/2018 0901   GFRAA >60 06/04/2019 0855   GFRAA >60 12/18/2018 0901    No results found for: SPEP, UPEP  Lab Results  Component Value Date   WBC 3.9 (L) 06/04/2019   NEUTROABS 1.1 (L) 06/04/2019   HGB 12.6 06/04/2019   HCT 38.2 06/04/2019   MCV 105.8 (H) 06/04/2019   PLT 180 06/04/2019      Chemistry      Component Value Date/Time   NA 140 06/04/2019 0855   NA 142 01/04/2017 0902   K 4.2 06/04/2019 0855   K 3.9 01/04/2017 0902   CL 107 06/04/2019 0855   CO2 26 06/04/2019 0855   CO2 28 01/04/2017 0902   BUN 12 06/04/2019 0855   BUN 9.5 01/04/2017 0902   CREATININE 0.75 06/04/2019 0855   CREATININE 0.80 12/18/2018 0901   CREATININE 0.7 01/04/2017 0902      Component Value Date/Time   CALCIUM 8.8 (L) 06/04/2019 0855   CALCIUM 9.0 01/04/2017 0902   ALKPHOS 70 06/04/2019 0855   ALKPHOS 101 01/04/2017  0902   AST 13 (L) 06/04/2019 0855   AST 18 12/18/2018 0901   AST 21  01/04/2017 0902   ALT 12 06/04/2019 0855   ALT 14 12/18/2018 0901   ALT 16 01/04/2017 0902   BILITOT 0.3 06/04/2019 0855   BILITOT 0.3 12/18/2018 0901   BILITOT 0.56 01/04/2017 0902      All questions were answered. The patient knows to call the clinic with any problems, questions or concerns. No barriers to learning was detected.  I spent 25 minutes counseling the patient face to face. The total time spent in the appointment was 30 minutes and more than 50% was on counseling and review of test results  [name], MD 06/04/2019 9:48 AM

## 2019-06-04 NOTE — Assessment & Plan Note (Signed)
The patient has achieved very good partial remission with intermittent undetectable M spike She has appointment to follow-up at [facility] with repeat bone marrow biopsy in January 2021 I do not recommend repeat skeletal survey as I do not feel it will change management  She will continue daratumumab along with pomalidomide She has completed tapered off dexamethasone She will continue acyclovir for antimicrobial prophylaxis She will continue aspirin for DVT prophylaxis Her recent bone density scan is excellent.  I recommend she discuss this with her transplant physician next month My recommendation would be to discontinue Zometa

## 2019-06-04 NOTE — Assessment & Plan Note (Signed)
She continues to have symptoms despite Protonix and Tums I recommend a trial of addition of Zantac

## 2019-06-05 LAB — KAPPA/LAMBDA LIGHT CHAINS
Kappa free light chain: 19.4 mg/L (ref 3.3–19.4)
Kappa, lambda light chain ratio: 0.9 (ref 0.26–1.65)
Lambda free light chains: 21.6 mg/L (ref 5.7–26.3)

## 2019-06-08 LAB — MULTIPLE MYELOMA PANEL, SERUM
Albumin SerPl Elph-Mcnc: 3.6 g/dL (ref 2.9–4.4)
Albumin/Glob SerPl: 1.8 — ABNORMAL HIGH (ref 0.7–1.7)
Alpha 1: 0.2 g/dL (ref 0.0–0.4)
Alpha2 Glob SerPl Elph-Mcnc: 0.7 g/dL (ref 0.4–1.0)
B-Globulin SerPl Elph-Mcnc: 0.7 g/dL (ref 0.7–1.3)
Gamma Glob SerPl Elph-Mcnc: 0.5 g/dL (ref 0.4–1.8)
Globulin, Total: 2.1 g/dL — ABNORMAL LOW (ref 2.2–3.9)
IgA: 228 mg/dL (ref 64–422)
IgG (Immunoglobin G), Serum: 517 mg/dL — ABNORMAL LOW (ref 586–1602)
IgM (Immunoglobulin M), Srm: 55 mg/dL (ref 26–217)
Total Protein ELP: 5.7 g/dL — ABNORMAL LOW (ref 6.0–8.5)

## 2019-06-09 ENCOUNTER — Telehealth: Payer: Self-pay | Admitting: *Deleted

## 2019-06-09 NOTE — Telephone Encounter (Signed)
Telephone call to patient to discuss the lab results. Patient report she has already received results via mychart.

## 2019-06-09 NOTE — Telephone Encounter (Signed)
-----   Message from Jacqulyn Liner, RN sent at 06/09/2019  8:48 AM EST ----- Regarding: RE: pls let her know M protein not detected last week on myeloma panel  ----- Message ----- From: [name], MD Sent: 06/09/2019   8:20 AM EST To: Jacqulyn Liner, RN Subject: pls let her know M protein not detected last#

## 2019-06-10 ENCOUNTER — Encounter: Payer: Self-pay | Admitting: Cardiovascular Disease

## 2019-06-10 ENCOUNTER — Ambulatory Visit: Payer: Medicare HMO | Admitting: Cardiovascular Disease

## 2019-06-10 ENCOUNTER — Other Ambulatory Visit: Payer: Self-pay

## 2019-06-10 VITALS — BP 120/80 | HR 87 | Ht 62.0 in | Wt 166.2 lb

## 2019-06-10 DIAGNOSIS — I471 Supraventricular tachycardia: Secondary | ICD-10-CM | POA: Diagnosis not present

## 2019-06-10 DIAGNOSIS — I10 Essential (primary) hypertension: Secondary | ICD-10-CM

## 2019-06-10 DIAGNOSIS — M79662 Pain in left lower leg: Secondary | ICD-10-CM | POA: Diagnosis not present

## 2019-06-10 NOTE — Patient Instructions (Signed)
Medication Instructions:  Your physician recommends that you continue on your current medications as directed. Please refer to the Current Medication list given to you today.  *If you need a refill on your cardiac medications before your next appointment, please call your pharmacy*  Lab Work: None ordered If you have labs (blood work) drawn today and your tests are completely normal, you will receive your results only by: Marland Kitchen MyChart Message (if you have MyChart) OR . A paper copy in the mail If you have any lab test that is abnormal or we need to change your treatment, we will call you to review the results.  Testing/Procedures: Your physician has requested that you have a lower extremity arterial exercise duplex. During this test, exercise and ultrasound are used to evaluate arterial blood flow in the legs. Allow one hour for this exam. There are no restrictions or special instructions.  Your physician has requested that you have a lower extremity venous doppler. During this test ultrasound are used to evaluate venous blood flow in the legs. Allow one hour for this exam. There are no restrictions or special instructions.  (Both exams are to be schedule asap)  Follow-Up: At [facility], you and your health needs are our priority.  As part of our continuing mission to provide you with exceptional heart care, we have created designated Provider Care Teams.  These Care Teams include your primary Cardiologist (physician) and Advanced Practice Providers (APPs -  Physician Assistants and Nurse Practitioners) who all work together to provide you with the care you need, when you need it.  Your next appointment:   6 month(s)  The format for your next appointment:   In Person  Provider:    You may see Kathlyn Sacramento, MD or one of the following Advanced Practice Providers on your designated Care Team:    Murray Hodgkins, NP  Christell Faith, PA-C  Marrianne Mood, PA-C   Other  Instructions N/A

## 2019-06-10 NOTE — Progress Notes (Signed)
Cardiology Office Note   Date:  06/10/2019   ID:  Jean Davidson, DOB 07/22/1945, MRN [id number]  PCP:  Haywood Pao, MD  Cardiologist:   Kathlyn Sacramento, MD   Chief Complaint  Patient presents with  . other    Pt. c/o bilateral calf pain in the evenings when sleeping; is concerned if a clot. Meds reviewed by the pt. verbally.       History of Present Illness: Jean Davidson is a 73 y.o. female who is here today for evaluation of left calf pain.  She has known history of paroxysmal SVT, multiple myeloma status post bone marrow transplant in 2016 and 2020, pericardial effusion that did not require intervention, essential hypertension, anemia and cervical spine stenosis. She had increased palpitations in May of this year.  ZIO monitor showed frequent runs of short SVT.  Echocardiogram in August showed normal LV systolic function, mild pulmonary hypertension and trivial pericardial effusion. She was switched from carvedilol to metoprolol with improvement in her symptoms.  She continues to take Pomalyst for multiple myeloma.  She has noted frequent episodes of left calf discomfort mostly at night but occasionally with walking.  The pain is usually sharp last for about 15 to 20 seconds.  No previous history of DVT.  Past Medical History:  Diagnosis Date  . Anemia   . Anxiety   . Blood transfusion without reported diagnosis   . Carotid stenosis    Mild bilateral  . Carpal tunnel syndrome   . Cervical disc disease    c7  . Cough 07/27/2015  . Disc degeneration   . Dysuria 07/26/2015  . 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 Fatigue 01/26/2015  . Fever 07/27/2015  . GERD (gastroesophageal reflux disease)   . 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 Hypertension    Borderline.  . Internal and external hemorrhoids without complication   . 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 Multiple myeloma ([place])   . Pinched  nerve   . Sinusitis, bacterial 07/27/2015  . Spinal stenosis in cervical region   . Varicose veins of lower extremities with inflammation     Past Surgical History:  Procedure Laterality Date  . BLADDER SUSPENSION  1988/1989   x2  . COLONOSCOPY    . ESOPHAGOGASTRODUODENOSCOPY    . IR IMAGING GUIDED PORT INSERTION  03/01/2018  . PORT-A-CATH REMOVAL    . SHOULDER SURGERY Right 04/06/13   right     Current Outpatient Medications  Medication Sig Dispense Refill  . acyclovir (ZOVIRAX) 400 MG tablet Take 1 tablet (400 mg total) by mouth 2 (two) times daily. 180 tablet 11  . aspirin 81 MG chewable tablet Chew 325 mg by mouth daily. Takes 4 tablets (325 mg daily) 21 days out of the month with chemo drug    . calcipotriene (DOVONOX) 0.005 % cream daily.     . calcium carbonate (TUMS - DOSED IN MG ELEMENTAL CALCIUM) 500 MG chewable tablet Chew 1 tablet by mouth daily.    . Cholecalciferol (VITAMIN D-1000 MAX ST) 1000 units tablet Take 1,000 Units by mouth daily.    . cyanocobalamin 1000 MCG tablet Take 1,000 mcg by mouth daily.    Marland Kitchen DARATUMUMAB IV Inject into the vein every 30 (thirty) days.    Marland Kitchen docusate sodium (COLACE) 100 MG capsule Take 100 mg by mouth daily as needed for mild constipation.     Marland Kitchen  loperamide (IMODIUM) 2 MG capsule Take by mouth as needed for diarrhea or loose stools. As needed only    . metoprolol succinate (TOPROL-XL) 25 MG 24 hr tablet Take 25 mg by mouth 2 (two) times daily.    . mirtazapine (REMERON) 15 MG tablet TAKE 1 TABLET BY MOUTH EVERYDAY AT BEDTIME 90 tablet 1  . Multiple Vitamin (MULTIVITAMIN WITH MINERALS) TABS tablet Take 1 tablet by mouth daily.    . ondansetron (ZOFRAN) 8 MG tablet Take 1 tablet (8 mg total) by mouth every 8 (eight) hours as needed (Nausea or vomiting). 30 tablet 1  . pantoprazole (PROTONIX) 40 MG tablet Take 1 tablet (40 mg total) by mouth daily. Take 30-60 minutes before breakfast and dinner 180 tablet 2  . pomalidomide (POMALYST) 2 MG  capsule Take 1 capsule (2 mg total) by mouth daily. Take with water on days 1-21. Repeat every 28 days. 21 capsule 0  . prochlorperazine (COMPAZINE) 10 MG tablet Take 1 tablet (10 mg total) by mouth every 6 (six) hours as needed (Nausea or vomiting). 30 tablet 1   No current facility-administered medications for this visit.    Allergies:   Patient has no known allergies.    Social History:  The patient  reports that she quit smoking about 50 years ago. She has a 2.00 pack-year smoking history. She has never used smokeless tobacco. She reports current alcohol use of about 1.0 standard drinks of alcohol per week. She reports that she does not use drugs.   Family History:  The patient's family history includes Colon cancer (age of onset: 61) in her paternal aunt; Diabetes in her father; Heart disease in her brother, father, and mother; Heart failure in her father; Hypertension in her brother; Kidney disease in her father; Skin cancer in her brother and father; Throat cancer in her father.    ROS:  Please see the history of present illness.   Otherwise, review of systems are positive for none.   All other systems are reviewed and negative.    PHYSICAL EXAM: VS:  BP 120/80 (BP Location: Left Arm, Patient Position: Sitting, Cuff Size: Normal)   Pulse 87   Ht '5\' 2"'  (1.575 m)   Wt 166 lb 4 oz (75.4 kg)   SpO2 98%   BMI 30.41 kg/m  , BMI Body mass index is 30.41 kg/m. GEN: Well nourished, well developed, in no acute distress  HEENT: normal  Neck: no JVD, carotid bruits, or masses Cardiac: RRR; no murmurs, rubs, or gallops, mild bilateral leg edema  Respiratory:  clear to auscultation bilaterally, normal work of breathing GI: soft, nontender, nondistended, + BS MS: no deformity or atrophy  Skin: warm and dry, no rash Neuro:  Strength and sensation are intact Psych: euthymic mood, full affect Vascular: Femoral pulses normal bilaterally.  Distal pulses are very weak.  This could be due to  some edema  EKG:  EKG is ordered today. The ekg ordered today demonstrates normal sinus rhythm with PVCs and possible left atrial enlargement.  Recent Labs: 12/10/2018: Magnesium 1.8; TSH 2.258 06/04/2019: ALT 12; BUN 12; Creatinine, Ser 0.75; Hemoglobin 12.6; Platelets 180; Potassium 4.2; Sodium 140    Lipid Panel    Component Value Date/Time   CHOL 110 08/08/2013 1127   TRIG 82.0 08/08/2013 1127   HDL 36.50 (L) 08/08/2013 1127   CHOLHDL 3 08/08/2013 1127   VLDL 16.4 08/08/2013 1127   LDLCALC 57 08/08/2013 1127      Wt Readings from Last  3 Encounters:  06/10/19 166 lb 4 oz (75.4 kg)  06/04/19 165 lb 3.2 oz (74.9 kg)  05/07/19 162 lb 3.2 oz (73.6 kg)       No flowsheet data found.    ASSESSMENT AND PLAN:  1.  Left calf pain: Her symptoms are overall atypical for claudication.  Possible neuropathic pain related to Pomalyst.  However, she is also at high risk for thromboembolism.  I recommend ruling out DVT and thus I requested venous duplex.  In addition, although her symptoms are atypical for claudication, her distal pulses are very weak and thus I requested lower extremity arterial Doppler.  2.  Paroxysmal supraventricular tachycardia: Symptoms are well controlled with metoprolol.    3.  History pericardial effusion: Echocardiogram in August showed only trivial effusion.    4.  Essential hypertension: Blood pressure is controlled.  5.  Multiple myeloma: Followed at [facility].    Disposition:   FU with me in 6 months  Signed,  Kathlyn Sacramento, MD  06/10/2019 4:22 PM    [facility]

## 2019-06-11 ENCOUNTER — Telehealth: Payer: Self-pay | Admitting: Cardiovascular Disease

## 2019-06-12 ENCOUNTER — Ambulatory Visit (HOSPITAL_COMMUNITY)
Admission: RE | Admit: 2019-06-12 | Discharge: 2019-06-12 | Disposition: A | Discharge status: Home / Self Care | Payer: Medicare HMO | Source: Ambulatory Visit | Attending: Cardiology | Admitting: Cardiology

## 2019-06-12 ENCOUNTER — Other Ambulatory Visit: Payer: Self-pay

## 2019-06-12 DIAGNOSIS — M79662 Pain in left lower leg: Secondary | ICD-10-CM

## 2019-06-12 DIAGNOSIS — C9002 Multiple myeloma in relapse: Secondary | ICD-10-CM

## 2019-06-12 MED ORDER — POMALIDOMIDE 2 MG PO CAPS: 2.0000 mg | ORAL_CAPSULE | Freq: Every day | ORAL | 0 refills | Status: DC

## 2019-06-13 ENCOUNTER — Other Ambulatory Visit: Payer: Self-pay | Admitting: Cardiovascular Disease

## 2019-06-13 DIAGNOSIS — M79662 Pain in left lower leg: Secondary | ICD-10-CM

## 2019-06-18 ENCOUNTER — Ambulatory Visit (HOSPITAL_COMMUNITY)
Admission: RE | Admit: 2019-06-18 | Discharge: 2019-06-18 | Disposition: A | Discharge status: Home / Self Care | Payer: Medicare HMO | Source: Ambulatory Visit | Attending: Cardiovascular Disease | Admitting: Cardiovascular Disease

## 2019-06-18 ENCOUNTER — Other Ambulatory Visit: Payer: Self-pay

## 2019-06-18 ENCOUNTER — Other Ambulatory Visit: Payer: Self-pay | Admitting: Hematology and Oncology

## 2019-06-18 DIAGNOSIS — M79662 Pain in left lower leg: Secondary | ICD-10-CM

## 2019-06-18 DIAGNOSIS — C9002 Multiple myeloma in relapse: Secondary | ICD-10-CM

## 2019-07-01 ENCOUNTER — Encounter: Payer: Self-pay | Admitting: Hematology and Oncology

## 2019-07-03 DIAGNOSIS — D469 Myelodysplastic syndrome, unspecified: Secondary | ICD-10-CM | POA: Diagnosis not present

## 2019-07-03 DIAGNOSIS — Z9481 Bone marrow transplant status: Secondary | ICD-10-CM | POA: Diagnosis not present

## 2019-07-03 DIAGNOSIS — C9000 Multiple myeloma not having achieved remission: Secondary | ICD-10-CM | POA: Diagnosis not present

## 2019-07-03 DIAGNOSIS — G8918 Other acute postprocedural pain: Secondary | ICD-10-CM | POA: Diagnosis not present

## 2019-07-08 IMAGING — DX DG CHEST 1V
1 series · 1 of 1 positions shown · non-contrast
Comparison: 03/09/2018

CLINICAL DATA: Fever and cough. Shortness of breath. Multiple
myeloma.

EXAM:
CHEST  1 VIEW

[chest pa]
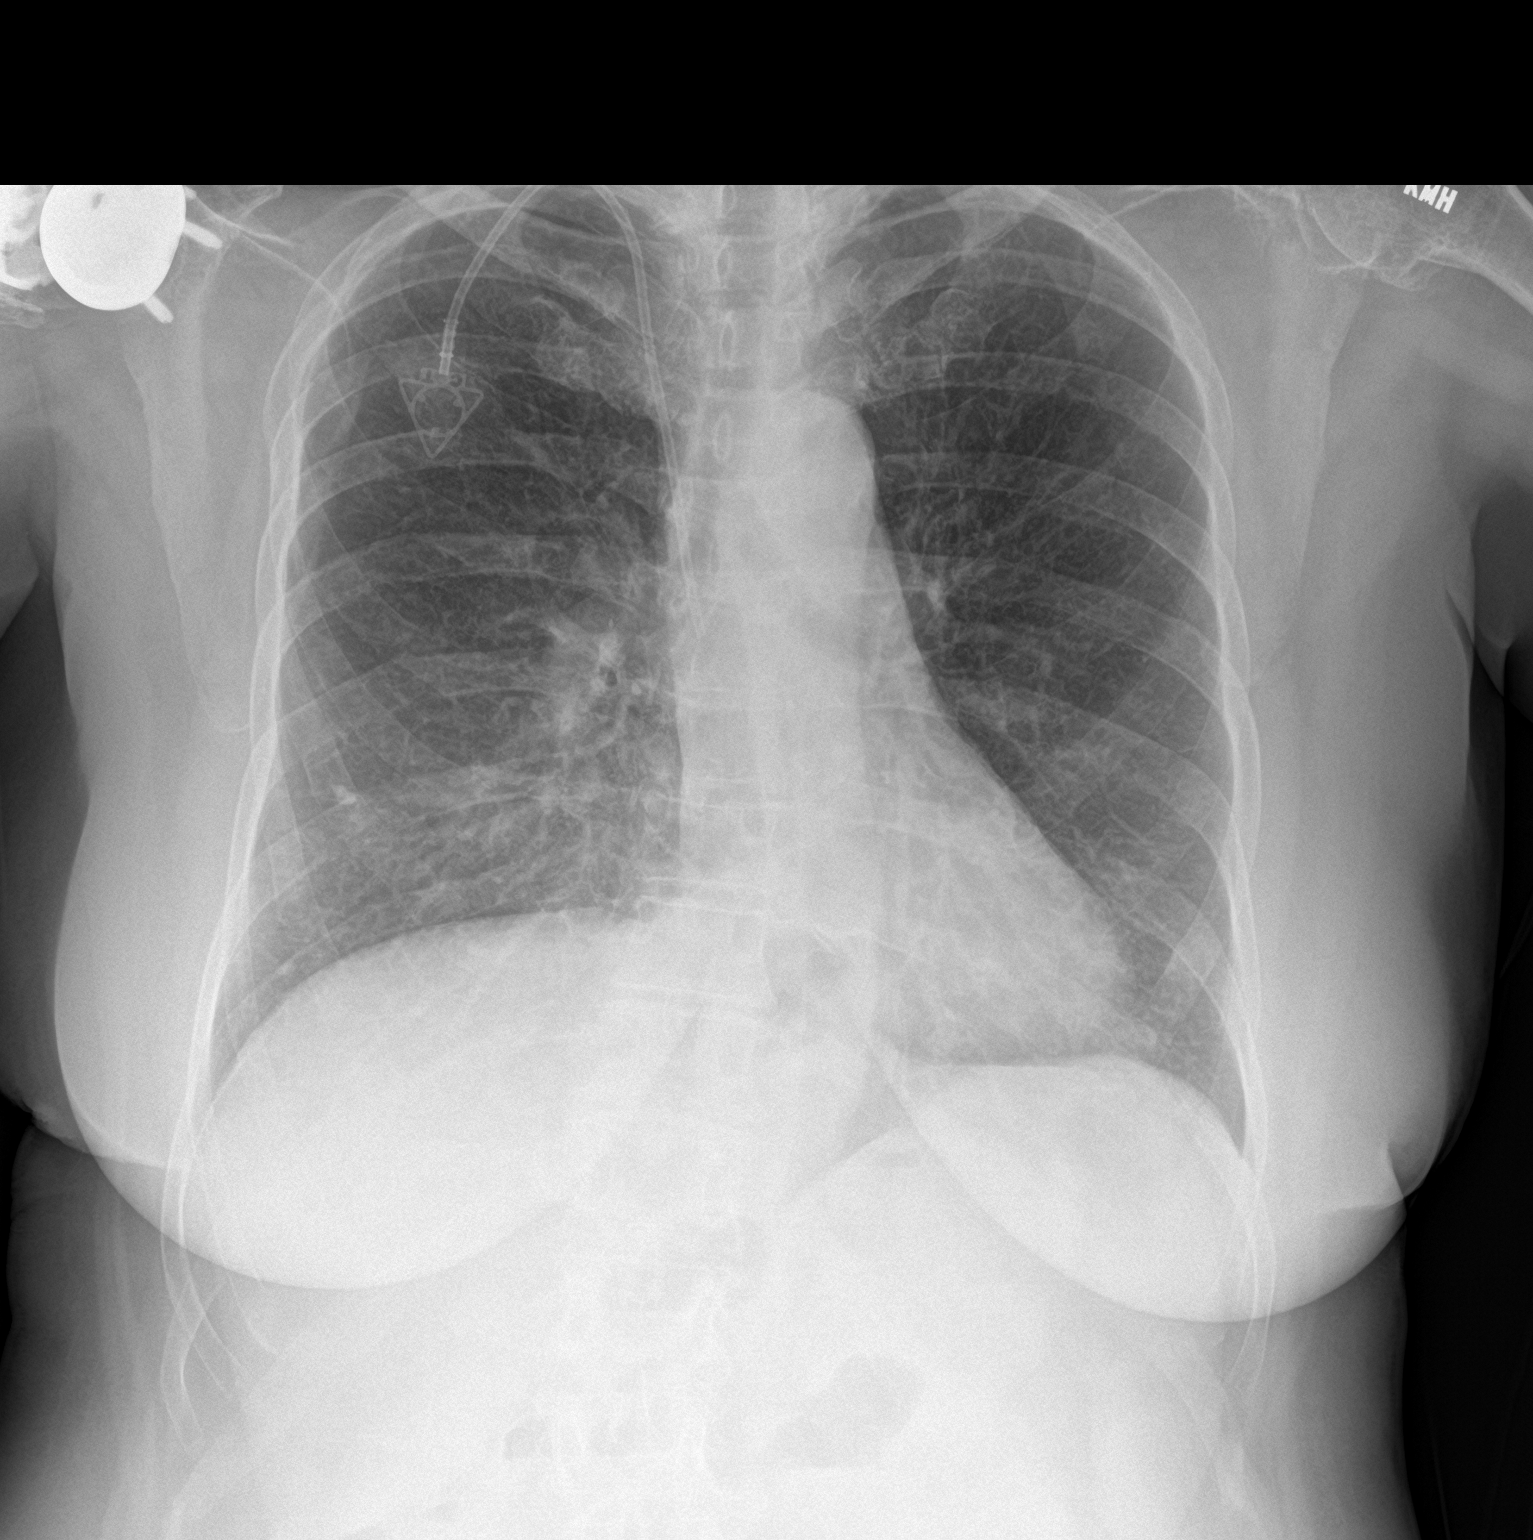

[1 of 1 positions shown; findings below may reference images not displayed]

FINDINGS: Heart size and pulmonary vascularity are normal. No infiltrates or
effusions. Tiny calcified granulomas at the right lung base
laterally.

Power port in place. Right total shoulder prosthesis. Moderate
arthritic changes of the left shoulder.

Lucency just above the diaphragm to the left of midline probably
represents a hiatal hernia.
IMPRESSION: No acute abnormalities.

## 2019-07-09 ENCOUNTER — Encounter: Payer: Self-pay | Admitting: Hematology and Oncology

## 2019-07-09 ENCOUNTER — Other Ambulatory Visit: Payer: Medicare HMO

## 2019-07-09 ENCOUNTER — Other Ambulatory Visit: Payer: Self-pay

## 2019-07-09 ENCOUNTER — Telehealth: Payer: Self-pay | Admitting: Hematology and Oncology

## 2019-07-09 ENCOUNTER — Inpatient Hospital Stay: Payer: Medicare HMO | Admitting: Hematology and Oncology

## 2019-07-09 ENCOUNTER — Inpatient Hospital Stay: Payer: Medicare HMO | Attending: Hematology and Oncology

## 2019-07-09 VITALS — BP 116/61 | HR 68 | Temp 98.5°F | Resp 18

## 2019-07-09 DIAGNOSIS — K219 Gastro-esophageal reflux disease without esophagitis: Secondary | ICD-10-CM

## 2019-07-09 DIAGNOSIS — Z79899 Other long term (current) drug therapy: Secondary | ICD-10-CM | POA: Insufficient documentation

## 2019-07-09 DIAGNOSIS — G620 Drug-induced polyneuropathy: Secondary | ICD-10-CM | POA: Insufficient documentation

## 2019-07-09 DIAGNOSIS — M6289 Other specified disorders of muscle: Secondary | ICD-10-CM | POA: Diagnosis not present

## 2019-07-09 DIAGNOSIS — L659 Nonscarring hair loss, unspecified: Secondary | ICD-10-CM | POA: Insufficient documentation

## 2019-07-09 DIAGNOSIS — C9002 Multiple myeloma in relapse: Secondary | ICD-10-CM | POA: Insufficient documentation

## 2019-07-09 DIAGNOSIS — M8580 Other specified disorders of bone density and structure, unspecified site: Secondary | ICD-10-CM | POA: Insufficient documentation

## 2019-07-09 DIAGNOSIS — Z5112 Encounter for antineoplastic immunotherapy: Secondary | ICD-10-CM | POA: Insufficient documentation

## 2019-07-09 DIAGNOSIS — R5381 Other malaise: Secondary | ICD-10-CM | POA: Diagnosis not present

## 2019-07-09 DIAGNOSIS — Z7982 Long term (current) use of aspirin: Secondary | ICD-10-CM | POA: Diagnosis not present

## 2019-07-09 DIAGNOSIS — T451X5A Adverse effect of antineoplastic and immunosuppressive drugs, initial encounter: Secondary | ICD-10-CM | POA: Diagnosis not present

## 2019-07-09 DIAGNOSIS — D469 Myelodysplastic syndrome, unspecified: Secondary | ICD-10-CM | POA: Diagnosis not present

## 2019-07-09 DIAGNOSIS — Z9221 Personal history of antineoplastic chemotherapy: Secondary | ICD-10-CM | POA: Diagnosis not present

## 2019-07-09 DIAGNOSIS — C9000 Multiple myeloma not having achieved remission: Secondary | ICD-10-CM | POA: Diagnosis not present

## 2019-07-09 MED ORDER — SODIUM CHLORIDE 0.9 % IV SOLN
16.0000 mg/kg | Freq: Once | INTRAVENOUS | Status: AC
  Administered 2019-07-09: 1200 mg via INTRAVENOUS
  Filled 2019-07-09: qty 60

## 2019-07-09 MED ORDER — ACETAMINOPHEN 325 MG PO TABS
650.0000 mg | ORAL_TABLET | Freq: Once | ORAL | Status: AC
  Administered 2019-07-09: 650 mg via ORAL

## 2019-07-09 MED ORDER — ESTRADIOL 0.1 MG/GM VA CREA: 1.0000 | TOPICAL_CREAM | Freq: Every day | VAGINAL | 12 refills | Status: DC

## 2019-07-09 MED ORDER — HEPARIN SOD (PORK) LOCK FLUSH 100 UNIT/ML IV SOLN
500.0000 [IU] | Freq: Once | INTRAVENOUS | Status: AC | PRN
  Administered 2019-07-09: 500 [IU]
  Filled 2019-07-09: qty 5

## 2019-07-09 MED ORDER — SODIUM CHLORIDE 0.9% FLUSH
10.0000 mL | INTRAVENOUS | Status: DC | PRN
  Administered 2019-07-09: 10 mL
  Filled 2019-07-09: qty 10

## 2019-07-09 MED ORDER — DEXAMETHASONE SODIUM PHOSPHATE 10 MG/ML IJ SOLN
10.0000 mg | Freq: Once | INTRAMUSCULAR | Status: AC
  Administered 2019-07-09: 10 mg via INTRAVENOUS

## 2019-07-09 MED ORDER — ACETAMINOPHEN 325 MG PO TABS
ORAL_TABLET | ORAL | Status: AC
  Filled 2019-07-09: qty 2

## 2019-07-09 MED ORDER — DIPHENHYDRAMINE HCL 25 MG PO CAPS
25.0000 mg | ORAL_CAPSULE | Freq: Once | ORAL | Status: AC
  Administered 2019-07-09: 25 mg via ORAL

## 2019-07-09 MED ORDER — PROCHLORPERAZINE MALEATE 10 MG PO TABS
ORAL_TABLET | ORAL | Status: AC
  Filled 2019-07-09: qty 1

## 2019-07-09 MED ORDER — DEXAMETHASONE SODIUM PHOSPHATE 10 MG/ML IJ SOLN
INTRAMUSCULAR | Status: AC
  Filled 2019-07-09: qty 1

## 2019-07-09 MED ORDER — PROCHLORPERAZINE MALEATE 10 MG PO TABS
10.0000 mg | ORAL_TABLET | Freq: Once | ORAL | Status: AC
  Administered 2019-07-09: 10 mg via ORAL

## 2019-07-09 MED ORDER — DIPHENHYDRAMINE HCL 25 MG PO CAPS
ORAL_CAPSULE | ORAL | Status: AC
  Filled 2019-07-09: qty 1

## 2019-07-09 MED ORDER — SODIUM CHLORIDE 0.9 % IV SOLN
Freq: Once | INTRAVENOUS | Status: AC
  Filled 2019-07-09: qty 250

## 2019-07-09 NOTE — Telephone Encounter (Signed)
Scheduled appt per 1/13 sch message - pt to get an updated schedule for next appt after chemo / RN aware to print schedule

## 2019-07-09 NOTE — Assessment & Plan Note (Signed)
She continues to have significant physical deconditioning I refer her to cancer rehab for physical therapy

## 2019-07-09 NOTE — Patient Instructions (Addendum)
[facility] Discharge Instructions for Patients Receiving Chemotherapy  Today you received the following chemotherapy agents: Daratumumab (DARZALEX).  To help prevent nausea and vomiting after your treatment, we encourage you to take your nausea medication as directed.   If you develop nausea and vomiting that is not controlled by your nausea medication, call the clinic.   BELOW ARE SYMPTOMS THAT SHOULD BE REPORTED IMMEDIATELY:  *FEVER GREATER THAN 100.5 F  *CHILLS WITH OR WITHOUT FEVER  NAUSEA AND VOMITING THAT IS NOT CONTROLLED WITH YOUR NAUSEA MEDICATION  *UNUSUAL SHORTNESS OF BREATH  *UNUSUAL BRUISING OR BLEEDING  TENDERNESS IN MOUTH AND THROAT WITH OR WITHOUT PRESENCE OF ULCERS  *URINARY PROBLEMS  *BOWEL PROBLEMS  UNUSUAL RASH Items with * indicate a potential emergency and should be followed up as soon as possible.  Feel free to call the clinic should you have any questions or concerns. The clinic phone number is [phone].  Please show the [facility] at check-in to the Emergency Department and triage nurse.  Coronavirus (COVID-19) Are you at risk?  Are you at risk for the Coronavirus (COVID-19)?  To be considered HIGH RISK for Coronavirus (COVID-19), you have to meet the following criteria:  . Traveled to [place], [place], [place], [place] or [place]; or in the [place] to [place], [place], [place], or [place]; and have fever, cough, and shortness of breath within the last 2 weeks of travel OR . Been in close contact with a person diagnosed with COVID-19 within the last 2 weeks and have fever, cough, and shortness of breath . IF YOU DO NOT MEET THESE CRITERIA, YOU ARE CONSIDERED LOW RISK FOR COVID-19.  What to do if you are HIGH RISK for COVID-19?  Marland Kitchen If you are having a medical emergency, call 911. . Seek medical care right away. Before you go to a doctor's office, urgent care or emergency department, call ahead and tell them  about your recent travel, contact with someone diagnosed with COVID-19, and your symptoms. You should receive instructions from your physician's office regarding next steps of care.  . When you arrive at healthcare provider, tell the healthcare staff immediately you have returned from visiting [place], [place], [place], [place] or [place]; or traveled in the [place] to [place], [place], [place], or [place]; in the last two weeks or you have been in close contact with a person diagnosed with COVID-19 in the last 2 weeks.   . Tell the health care staff about your symptoms: fever, cough and shortness of breath. . After you have been seen by a medical provider, you will be either: o Tested for (COVID-19) and discharged home on quarantine except to seek medical care if symptoms worsen, and asked to  - Stay home and avoid contact with others until you get your results (4-5 days)  - Avoid travel on public transportation if possible (such as bus, train, or airplane) or o Sent to the Emergency Department by EMS for evaluation, COVID-19 testing, and possible admission depending on your condition and test results.  What to do if you are LOW RISK for COVID-19?  Reduce your risk of any infection by using the same precautions used for avoiding the common cold or flu:  Marland Kitchen Wash your hands often with soap and warm water for at least 20 seconds.  If soap and water are not readily available, use an alcohol-based hand sanitizer with at least 60% alcohol.  . If coughing or  sneezing, cover your mouth and nose by coughing or sneezing into the elbow areas of your shirt or coat, into a tissue or into your sleeve (not your hands). . Avoid shaking hands with others and consider head nods or verbal greetings only. . Avoid touching your eyes, nose, or mouth with unwashed hands.  . Avoid close contact with people who are sick. . Avoid places or events with large numbers of people in one location, like concerts or  sporting events. . Carefully consider travel plans you have or are making. . If you are planning any travel outside or inside the [name], visit the CDC's Travelers' Health webpage for the latest health notices. . If you have some symptoms but not all symptoms, continue to monitor at home and seek medical attention if your symptoms worsen. . If you are having a medical emergency, call 911.   [facility] / e-Visit: [email or web address]         [facility]: [facility]: [phone]                   [facility]: [phone]

## 2019-07-09 NOTE — Assessment & Plan Note (Signed)
I have reviewed her recent work-up at [facility] The patient appears to be in complete remission She tolerated treatment with pomalidomide as well as daratumumab well without major side effects She remains on acyclovir for antimicrobial prophylaxis She will continue aspirin therapy for DVT prophylaxis She will continue calcium and vitamin D along with Zometa

## 2019-07-09 NOTE — Assessment & Plan Note (Signed)
We discussed conservative approach either with referral to see dermatologist or getting prescription of cranial prosthesis She declined prescription for cranial prosthesis

## 2019-07-09 NOTE — Assessment & Plan Note (Signed)
She had history of surgical lift procedure for uterine prolapse and continues to have difficulties with her bowel and bladder function due to prolapse I recommend trial of vaginal Estrace and referral to physical therapy to discuss pelvic floor exercises and therapy

## 2019-07-09 NOTE — Assessment & Plan Note (Signed)
She continues to have significant signs and symptoms of stomach reflux causing her to have some vomiting recently We discussed trial of addition of Zantac/Pepcid in addition to her current prescription of Protonix

## 2019-07-09 NOTE — Assessment & Plan Note (Signed)
Recent bone marrow biopsy shows she is fully engrafted with more than 98% donor cells She will continue follow-up at [facility] 

## 2019-07-09 NOTE — Progress Notes (Signed)
[employer] OFFICE PROGRESS NOTE  Patient Care Team: [name], [name], MD as PCP - General (Internal Medicine) [name], MD as PCP - Cardiology (Cardiology) [name], MD as Referring Physician (Hematology and Oncology) [name], MD as Consulting Physician (Internal Medicine) [name], [name], MD as Consulting Physician (Infectious Diseases) [name], MD as Consulting Physician (Hematology and Oncology) [name], NP as Nurse Practitioner (Hematology and Oncology)  ASSESSMENT & PLAN:  Multiple myeloma [facility]) I have reviewed her recent work-up at [facility] The patient appears to be in complete remission She tolerated treatment with pomalidomide as well as daratumumab well without major side effects She remains on acyclovir for antimicrobial prophylaxis She will continue aspirin therapy for DVT prophylaxis She will continue calcium and vitamin D along with Zometa  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[place]) Recent bone marrow biopsy shows she is fully engrafted with more than 98% donor cells She will continue follow-up at [facility]  GERD (gastroesophageal reflux disease) She continues to have significant signs and symptoms of stomach reflux causing her to have some vomiting recently We discussed trial of addition of Zantac/Pepcid in addition to her current prescription of Protonix  Pelvic floor dysfunction in female She had history of surgical lift procedure for uterine prolapse and continues to have difficulties with her bowel and bladder function due to prolapse I recommend trial of vaginal Estrace and referral to physical therapy to discuss pelvic floor exercises and therapy  Alopecia We discussed conservative approach either with referral to see dermatologist or getting prescription of cranial prosthesis She declined prescription for cranial prosthesis  Physical deconditioning She continues to  have significant physical deconditioning I refer her to cancer rehab for physical therapy  Peripheral neuropathy due to chemotherapy [facility]) This is due to her prior treatment It is not bothering her too much Observe only for now   Orders Placed This Encounter  Procedures  . Ambulatory referral to Physical Therapy    Referral Priority:   Routine    Referral Type:   Physical Medicine    Referral Reason:   Specialty Services Required    Requested Specialty:   Physical Therapy    Number of Visits Requested:   1    All questions were answered. The patient knows to call the clinic with any problems, questions or concerns. The total time spent in the appointment was 40 minutes encounter with patients including review of chart and various tests results, discussions about plan of care and coordination of care plan   [name], MD 07/09/2019 10:00 AM  INTERVAL HISTORY: Please see below for problem oriented charting. I have reviewed electronic records extensively pertaining to her work-up at [facility] as well as recent ultrasound venous Doppler and ABI study by her cardiologist for evaluation of leg cramps She has no further leg cramps since last time I saw her She continues to have slight neuropathy from prior treatment but does not bother her She has questions related to management of alopecia She suspect is related to side effects of her treatment She complained of excessive fatigue despite sleeping adequate amount of time at night She is not able to exercise much because of excessive fatigue She is bothered a lot by symptoms of reflux sensation and recent nausea vomiting that could be related to reflux She is already taking Protonix once to twice a day She is limiting her caffeinated drink intake She have questions related to Covid vaccine She has history of uterine  prolapse and had surgical lift procedure x2 but continues to have symptoms related to prolapse She have difficulty with  urination and moving her bowels because of this She denies infusion reaction She tolerated recent bone marrow biopsy well at [place]: Oncology History Overview Note  Multiple myeloma, Ig A Lambda, M spike 3.54 grams, Calcium 9.2, Creatinine 0.8, Beta 2 microglobulin 4.52, IgA 4840 mg/dL, lambda light chain 75.4, albumin 3.6, hemoglobin 9.7, platelet 115    Primary site: Multiple Myeloma   Staging method: AJCC 6th Edition   Clinical: Stage IIA signed by [name], MD on 11/07/2013  2:46 PM   Summary: Stage IIA      Multiple myeloma ([place])  10/31/2013 Bone Marrow Biopsy   Bone marrow biopsy confirmed multiple myeloma with 40% bone marrow involvement. Skeletal survey showed minimal lesions in her score with generalized demineralization   11/10/2013 - 02/13/2014 Chemotherapy   The patient is started on induction chemotherapy with weekly dexamethasone 40 mg by mouth as well as Velcade subcutaneous injection on days 1, 4, 8 and 11. On 11/21/2013, she was started on monthly Zometa.   12/23/2013 Adverse Reaction   The dose of Velcade was reduced due to thrombocytopenia.   01/28/2014 - 04/07/2014 Chemotherapy   Revlimid is added. Treatment was discontinued due to lack of response.   02/24/2014 - 04/07/2014 Chemotherapy   Due to worsening peripheral neuropathy, Velcade injection is changed to once a week. Revlimid was given 21 days on, 7 days off.   04/07/2014 - 04/10/2014 Chemotherapy   Revlimid was discontinued due to lack of response. Chemotherapy was changed back to Velcade injection twice a week, 2 weeks on 1 week off. Her treatment was switched to to minimum response   04/20/2014 - 06/02/2014 Chemotherapy   chemotherapy is switched to Carfilzomib, Cytoxan and dexamethasone.   04/22/2014 Procedure   she has placement of port for chemotherapy.   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 No cource was found and fever resolved with IV vancomycin and meropenem   09/13/2015 Bone Marrow Biopsy   Bone marrow biopsy showed no increased blasts, 3-4 % plasma cells   03/02/2016 Bone Marrow Biopsy   Bone marrow biopsy at [facility] showed normocellular (30%) bone marrow with trilineage hematopoiesis. No significant increase in blasts. No significant  increase in plasma cells.   05/12/2016 Imaging   DEXA scan at [place] showed osteopenia   10/24/2016 Imaging   Skeletal survey at [facility], no new lesions   12/07/2017 Imaging   No focal abnormality noted to suggest myeloma. Exam is stable from prior exam.   03/01/2018 Procedure   Successful 8 French right internal jugular vein power port placement with its tip at the SVC/RA junction.   03/06/2018 -  Chemotherapy   The patient had daratumumab    07/09/2018 Bone Marrow Biopsy   Bone marrow biopsy at [facility]  showed residual disease at 0.004% plasma cells   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[place])  04/06/2015 Bone Marrow Biopsy   Accession: [id number] BM biopsy showed RAEB-1   04/06/2015 Tumor Marker   Cytogenetics and FISH for MDS are within normal limits   10/06/2015 - 10/10/2015 Chemotherapy   She received conditioning chemotherapy with busulfan and melphalan   10/12/2015 Bone Marrow Transplant   She received allogenic stem cell transplant   10/19/2015 Adverse Reaction   She developed posttransplant complication with mucositis, viral infection with rhinovirus, neutropenic fever, bilateral pleural effusion and moderate pericardial effusion and CMV reactivation.   10/31/2015 Miscellaneous   She has engrafted     REVIEW OF SYSTEMS:   Constitutional: Denies fevers, chills or abnormal weight loss Eyes: Denies blurriness of vision Ears, nose, mouth, throat, and face: Denies mucositis or sore throat Respiratory: Denies cough, dyspnea or wheezes Cardiovascular: Denies palpitation, chest discomfort or lower extremity swelling Skin: Denies abnormal skin rashes Lymphatics: Denies new lymphadenopathy or easy bruising Behavioral/Psych: Mood is stable, no new changes  All other systems were reviewed with the patient and are negative.  I have reviewed the past medical history, past surgical history, social history and family history with the patient and they are unchanged from previous note.  ALLERGIES:  has No Known Allergies.  MEDICATIONS:  Current Outpatient Medications  Medication Sig Dispense Refill  . acyclovir (ZOVIRAX) 400 MG tablet TAKE 1 TABLET BY MOUTH TWICE A DAY 180 tablet 11  . aspirin 81 MG chewable tablet Chew 325 mg by mouth daily. Takes 4 tablets (325 mg daily) 21 days out of the month with chemo drug    . calcipotriene (DOVONOX) 0.005 % cream daily.     . calcium carbonate (TUMS - DOSED IN MG ELEMENTAL CALCIUM) 500 MG chewable tablet Chew 1 tablet by mouth  daily.    . Cholecalciferol (VITAMIN D-1000 MAX ST) 1000 units tablet Take 1,000 Units by mouth daily.    . cyanocobalamin 1000 MCG tablet Take 1,000 mcg by mouth daily.    Marland Kitchen DARATUMUMAB IV Inject into the vein every 30 (thirty) days.    Marland Kitchen docusate sodium (COLACE) 100 MG capsule Take 100 mg by mouth daily as needed for mild constipation.     Marland Kitchen estradiol (ESTRACE VAGINAL) 0.1 MG/GM vaginal cream Place 1 Applicatorful vaginally at bedtime. 42.5 g 12  . loperamide (IMODIUM) 2 MG capsule Take by mouth as needed for diarrhea or loose stools. As needed only    . metoprolol succinate (TOPROL-XL) 25 MG 24 hr tablet Take 25 mg by mouth 2 (two) times daily.    . mirtazapine (REMERON) 15 MG tablet TAKE 1 TABLET BY MOUTH EVERYDAY AT BEDTIME 90 tablet 1  . Multiple Vitamin (MULTIVITAMIN WITH MINERALS) TABS tablet Take 1 tablet by mouth daily.    . ondansetron (ZOFRAN) 8 MG tablet Take 1 tablet (8 mg total) by mouth every 8 (eight) hours as needed (Nausea  or vomiting). 30 tablet 1  . pantoprazole (PROTONIX) 40 MG tablet Take 1 tablet (40 mg total) by mouth daily. Take 30-60 minutes before breakfast and dinner 180 tablet 2  . pomalidomide (POMALYST) 2 MG capsule Take 1 capsule (2 mg total) by mouth daily. Take with water on days 1-21. Repeat every 28 days. 21 capsule 0  . prochlorperazine (COMPAZINE) 10 MG tablet Take 1 tablet (10 mg total) by mouth every 6 (six) hours as needed (Nausea or vomiting). 30 tablet 1   No current facility-administered medications for this visit.   Facility-Administered Medications Ordered in Other Visits  Medication Dose Route Frequency Provider Last Rate Last Admin  . daratumumab (DARZALEX) 1,200 mg in sodium chloride 0.9 % 440 mL chemo infusion  16 mg/kg (Treatment Plan Recorded) Intravenous Once Alvy Bimler, Zalmen Wrightsman, MD      . heparin lock flush 100 unit/mL  500 Units Intracatheter Once PRN Alvy Bimler, Rain Friedt, MD      . sodium chloride flush (NS) 0.9 % injection 10 mL  10 mL Intracatheter PRN  Alvy Bimler, Versia Mignogna, MD        PHYSICAL EXAMINATION: ECOG PERFORMANCE STATUS: 1 - Symptomatic but completely ambulatory  Vitals:   07/09/19 0815  BP: (!) 130/42  Pulse: 60  Resp: 18  Temp: 98.2 F (36.8 C)  SpO2: 99%   Filed Weights   07/09/19 0815  Weight: 163 lb 12.8 oz (74.3 kg)    GENERAL:alert, no distress and comfortable SKIN: skin color, texture, turgor are normal, no rashes or significant lesions.  Noted alopecia EYES: normal, Conjunctiva are pink and non-injected, sclera clear OROPHARYNX:no exudate, no erythema and lips, buccal mucosa, and tongue normal  NECK: supple, thyroid normal size, non-tender, without nodularity LYMPH:  no palpable lymphadenopathy in the cervical, axillary or inguinal LUNGS: clear to auscultation and percussion with normal breathing effort HEART: regular rate & rhythm and no murmurs and no lower extremity edema ABDOMEN:abdomen soft, non-tender and normal bowel sounds Musculoskeletal:no cyanosis of digits and no clubbing  NEURO: alert & oriented x 3 with fluent speech, no focal motor/sensory deficits  LABORATORY DATA:  I have reviewed the data as listed    Component Value Date/Time   NA 140 06/04/2019 0855   NA 142 01/04/2017 0902   K 4.2 06/04/2019 0855   K 3.9 01/04/2017 0902   CL 107 06/04/2019 0855   CO2 26 06/04/2019 0855   CO2 28 01/04/2017 0902   GLUCOSE 90 06/04/2019 0855   GLUCOSE 92 01/04/2017 0902   BUN 12 06/04/2019 0855   BUN 9.5 01/04/2017 0902   CREATININE 0.75 06/04/2019 0855   CREATININE 0.80 12/18/2018 0901   CREATININE 0.7 01/04/2017 0902   CALCIUM 8.8 (L) 06/04/2019 0855   CALCIUM 9.0 01/04/2017 0902   PROT 6.1 (L) 06/04/2019 0855   PROT 6.0 (L) 01/04/2017 0902   PROT 5.8 (L) 01/04/2017 0902   ALBUMIN 3.7 06/04/2019 0855   ALBUMIN 3.4 (L) 01/04/2017 0902   AST 13 (L) 06/04/2019 0855   AST 18 12/18/2018 0901   AST 21 01/04/2017 0902   ALT 12 06/04/2019 0855   ALT 14 12/18/2018 0901   ALT 16 01/04/2017 0902    ALKPHOS 70 06/04/2019 0855   ALKPHOS 101 01/04/2017 0902   BILITOT 0.3 06/04/2019 0855   BILITOT 0.3 12/18/2018 0901   BILITOT 0.56 01/04/2017 0902   GFRNONAA >60 06/04/2019 0855   GFRNONAA >60 12/18/2018 0901   GFRAA >60 06/04/2019 0855   GFRAA >60 12/18/2018 0901  No results found for: SPEP, UPEP  Lab Results  Component Value Date   WBC 3.9 (L) 06/04/2019   NEUTROABS 1.1 (L) 06/04/2019   HGB 12.6 06/04/2019   HCT 38.2 06/04/2019   MCV 105.8 (H) 06/04/2019   PLT 180 06/04/2019      Chemistry      Component Value Date/Time   NA 140 06/04/2019 0855   NA 142 01/04/2017 0902   K 4.2 06/04/2019 0855   K 3.9 01/04/2017 0902   CL 107 06/04/2019 0855   CO2 26 06/04/2019 0855   CO2 28 01/04/2017 0902   BUN 12 06/04/2019 0855   BUN 9.5 01/04/2017 0902   CREATININE 0.75 06/04/2019 0855   CREATININE 0.80 12/18/2018 0901   CREATININE 0.7 01/04/2017 0902      Component Value Date/Time   CALCIUM 8.8 (L) 06/04/2019 0855   CALCIUM 9.0 01/04/2017 0902   ALKPHOS 70 06/04/2019 0855   ALKPHOS 101 01/04/2017 0902   AST 13 (L) 06/04/2019 0855   AST 18 12/18/2018 0901   AST 21 01/04/2017 0902   ALT 12 06/04/2019 0855   ALT 14 12/18/2018 0901   ALT 16 01/04/2017 0902   BILITOT 0.3 06/04/2019 0855   BILITOT 0.3 12/18/2018 0901   BILITOT 0.56 01/04/2017 0902       RADIOGRAPHIC STUDIES: I have personally reviewed the radiological images as listed and agreed with the findings in the report. VAS [name] LOWER EXT ART SEG MULTI (SEGMENTALS & LE RAYNAUDS)  Result Date: 06/18/2019 LOWER EXTREMITY DOPPLER STUDY Indications: Patient has a history of peripheral neuropathy due to chemotherapy.              Today. she reports occasional sharp pain to the left calf x 2              months and once in the right calf that occurred last night. She              states the pain occurs at night. She denies any claudication or              rest pain. High Risk Factors: Hypertension, past history of  smoking.  Comparison Study: NA Performing Technologist: Sharlett Iles RVT  Examination Guidelines: A complete evaluation includes at minimum, Doppler waveform signals and systolic blood pressure reading at the level of bilateral brachial, anterior tibial, and posterior tibial arteries, when vessel segments are accessible. Bilateral testing is considered an integral part of a complete examination. Photoelectric Plethysmograph (PPG) waveforms and toe systolic pressure readings are included as required and additional duplex testing as needed. Limited examinations for reoccurring indications may be performed as noted.  ABI Findings: +---------+------------------+-----+---------+---------------------------------+ Right    Rt Pressure (mmHg)IndexWaveform Comment                           +---------+------------------+-----+---------+---------------------------------+ Brachial                                 Rod in arm, patient refused BP to                                          the right arm                     +---------+------------------+-----+---------+---------------------------------+  CFA                             triphasic                                  +---------+------------------+-----+---------+---------------------------------+ Popliteal                       triphasic                                  +---------+------------------+-----+---------+---------------------------------+ ATA      150               1.06 triphasic                                  +---------+------------------+-----+---------+---------------------------------+ PTA      166               1.18 triphasic                                  +---------+------------------+-----+---------+---------------------------------+ PERO     154               1.09 triphasic                                  +---------+------------------+-----+---------+---------------------------------+ Great Toe118                0.84 Abnormal                                   +---------+------------------+-----+---------+---------------------------------+ +---------+------------------+-----+---------+-------+ Left     Lt Pressure (mmHg)IndexWaveform Comment +---------+------------------+-----+---------+-------+ Brachial 141                                     +---------+------------------+-----+---------+-------+ CFA                             triphasic        +---------+------------------+-----+---------+-------+ Popliteal                       triphasic        +---------+------------------+-----+---------+-------+ ATA      161               1.14 triphasic        +---------+------------------+-----+---------+-------+ PTA      168               1.19 triphasic        +---------+------------------+-----+---------+-------+ PERO     163               1.16 triphasic        +---------+------------------+-----+---------+-------+ Great Toe88                0.62 Abnormal         +---------+------------------+-----+---------+-------+ +-------+-----------+-----------+------------+------------+ ABI/TBIToday's ABIToday's TBIPrevious ABIPrevious TBI +-------+-----------+-----------+------------+------------+ Right  1.18       .  84                                 +-------+-----------+-----------+------------+------------+ Left   1.19       .62                                 +-------+-----------+-----------+------------+------------+  Summary: Right: Resting right ankle-brachial index is within normal range. No evidence of significant right lower extremity arterial disease. The right toe-brachial index is normal. Left: Resting left ankle-brachial index is within normal range. No evidence of significant left lower extremity arterial disease. The left toe-brachial index is abnormal.  *See table(s) above for measurements and observations.  Electronically signed by Quay Burow MD on 06/18/2019 at 2:46:18 PM.    Final    VAS [name] LOWER EXTREMITY VENOUS (DVT)  Result Date: 06/13/2019  Lower Venous Study Other Indications: Patient complains of left calf pain while laying in bed. It                    is intermittent and lasts a few seconds at at time. This has                    been ongoing for the past three weeks. Risk Factors: None identified. Performing Technologist: Wilkie Aye RVT  Examination Guidelines: A complete evaluation includes B-mode imaging, spectral Doppler, color Doppler, and power Doppler as needed of all accessible portions of each vessel. Bilateral testing is considered an integral part of a complete examination. Limited examinations for reoccurring indications may be performed as noted.  +---------+---------------+---------+-----------+----------+--------------+ LEFT     CompressibilityPhasicitySpontaneityPropertiesThrombus Aging +---------+---------------+---------+-----------+----------+--------------+ CFV      Full           Yes      Yes                                 +---------+---------------+---------+-----------+----------+--------------+ SFJ      Full           Yes      Yes                                 +---------+---------------+---------+-----------+----------+--------------+ FV Prox  Full           Yes      Yes                                 +---------+---------------+---------+-----------+----------+--------------+ FV Mid   Full           Yes      Yes                                 +---------+---------------+---------+-----------+----------+--------------+ FV DistalFull           Yes      Yes                                 +---------+---------------+---------+-----------+----------+--------------+ PFV      Full                                                        +---------+---------------+---------+-----------+----------+--------------+  POP      Full           Yes      Yes                                  +---------+---------------+---------+-----------+----------+--------------+ PTV      Full           Yes      Yes                                 +---------+---------------+---------+-----------+----------+--------------+ PERO     Full           Yes      Yes                                 +---------+---------------+---------+-----------+----------+--------------+ Gastroc  Full                                                        +---------+---------------+---------+-----------+----------+--------------+ GSV      Full           Yes      Yes                                 +---------+---------------+---------+-----------+----------+--------------+  Summary:  Right: No evidence of common femoral vein obstruction.  Left: No evidence of deep vein thrombosis in the lower extremity. No indirect evidence of obstruction proximal to the inguinal ligament. No cystic structure found in the popliteal fossa.  *See table(s) above for measurements and observations. Electronically signed by Quay Burow MD on 06/13/2019 at 1:32:26 PM.    Final

## 2019-07-09 NOTE — Progress Notes (Signed)
Printed copy of AVS with updated schedule provided to patient.

## 2019-07-09 NOTE — Progress Notes (Signed)
Okay to treat with labs from [place] on 1/7 per Dr. Alvy Bimler.

## 2019-07-09 NOTE — Assessment & Plan Note (Signed)
This is due to her prior treatment It is not bothering her too much Observe only for now

## 2019-07-09 NOTE — Progress Notes (Signed)
Patient has requested to defer her zometa infusion until her next scheduled darzalex infusion.

## 2019-07-11 ENCOUNTER — Telehealth: Payer: Self-pay | Admitting: Cardiovascular Disease

## 2019-07-11 NOTE — Telephone Encounter (Signed)
STAT if HR is under 50 or over 120 (normal HR is 60-100 beats per minute)  1) What is your heart rate? 101   2) Do you have a log of your heart rate readings (document readings)?  124/77 77 vs today   3) Do you have any other symptoms? Tach with exertion 70's at rest   Recent cancer treatment  C/o nervousness some sob and stressed at undisclosed situation

## 2019-07-11 NOTE — Telephone Encounter (Signed)
I agree with your recommendations.  Nothing to add.  Increased heart rate with activities as expected.  Anxiety might also be contributing.

## 2019-07-11 NOTE — Telephone Encounter (Signed)
Patient made aware of Dr. Tyrell Antonio response with verbalized understanding. Pt voiced appreciation for the call back.

## 2019-07-11 NOTE — Telephone Encounter (Signed)
Spoke with the patient.  Pt sts that she does not normally keep monitor her BP of HR. She recently starting wearing a fitbit and has noticed hat she has been having HR escalation with minimal exertion. Ptst that at rest her HR will be in the 60's at rest and jump up to 80,90s when she moves around. She has not recorded any HR readings above 100 bpm. Pt denies palpitation, sob. lightheadiness or dizziness. She is currently taking cancer treatments and she is usually fatigued. Pt sts that her highre HR could be related to anxiety and stress. Her daughter and son in [place] for the [place] in [place].  Confirmed the pt is still taking Metoprolol. Pt sts that her bottle sts that she should be taking Metoprolol tartrate 37.5mg  bid. She had only been taking 25mg  bid. Pt sts she did take the 37.5mg  this morning. Pt BP this morning  124/77 77bpm. Pt BP 1 hour before calling the office 154/80 72 bpm.  Advised the patient to continue taking the Metoprolol tartrate 37.5mg  bid as prescribed. Adv her to continue to monitor her BP and HR daily. Adv her to contact the office if her if BP is soft or she becomes Loletha Grayer. Advised the pt that I will fwd the message to Dr. Fletcher Anon and call back with his recommendation.

## 2019-07-17 DIAGNOSIS — C9000 Multiple myeloma not having achieved remission: Secondary | ICD-10-CM | POA: Diagnosis not present

## 2019-07-23 ENCOUNTER — Other Ambulatory Visit: Payer: Self-pay | Admitting: Gastroenterology

## 2019-07-25 ENCOUNTER — Encounter: Payer: Self-pay | Admitting: Hematology and Oncology

## 2019-07-28 ENCOUNTER — Encounter: Payer: Self-pay | Admitting: Hematology and Oncology

## 2019-07-28 ENCOUNTER — Other Ambulatory Visit: Payer: Self-pay | Admitting: *Deleted

## 2019-07-28 DIAGNOSIS — C9002 Multiple myeloma in relapse: Secondary | ICD-10-CM

## 2019-07-28 MED ORDER — POMALIDOMIDE 2 MG PO CAPS: 2.0000 mg | ORAL_CAPSULE | Freq: Every day | ORAL | 0 refills | Status: DC

## 2019-08-01 ENCOUNTER — Encounter: Payer: Self-pay | Admitting: Hematology and Oncology

## 2019-08-06 ENCOUNTER — Other Ambulatory Visit: Payer: Self-pay

## 2019-08-06 ENCOUNTER — Encounter: Payer: Self-pay | Admitting: Physical Therapy

## 2019-08-06 ENCOUNTER — Ambulatory Visit: Payer: Medicare HMO | Attending: Hematology and Oncology | Admitting: Physical Therapy

## 2019-08-06 ENCOUNTER — Encounter: Payer: Self-pay | Admitting: Hematology and Oncology

## 2019-08-06 DIAGNOSIS — N393 Stress incontinence (female) (male): Secondary | ICD-10-CM | POA: Diagnosis present

## 2019-08-06 DIAGNOSIS — R279 Unspecified lack of coordination: Secondary | ICD-10-CM | POA: Insufficient documentation

## 2019-08-06 DIAGNOSIS — M6281 Muscle weakness (generalized): Secondary | ICD-10-CM | POA: Insufficient documentation

## 2019-08-06 NOTE — Patient Instructions (Addendum)
Moisturizers . They are used in the vagina to hydrate the mucous membrane that make up the vaginal canal. . Designed to keep a more normal acid balance (ph) . Once placed in the vagina, it will last between two to three days.  . Use 2-3 times per week at bedtime  . Ingredients to avoid is glycerin and fragrance, can increase chance of infection . Should not be used just before sex due to causing irritation . Most are gels administered either in a tampon-shaped applicator or as a vaginal suppository. They are non-hormonal.     Creams to use externally on the Vulva area  V-magic cream - amazon  Julva-amazon  Vital "V [place] salve ( help moisturize and help with thinning vulvar area, does have [place] by Irwin Brakeman labial moisturizer ([place],   Coconut or olive oil  aloe   Things to avoid in the vaginal area . Do not use things to irritate the vulvar area . No lotions just specialized creams for the vulva area- Neogyn, V-magic, No soaps; can use Aveeno or Calendula cleanser if needed. Must be gentle . No deodorants . No douches . Good to sleep without underwear to let the vaginal area to air out . No scrubbing: spread the lips to let warm water rinse over labias and pat dry  Massage the area between the anus and vagina with a circular massage Press circle release the vulva area  Do daily   [facility] [address], [place], [place] [address] Phone # [phone] Fax [phone]

## 2019-08-06 NOTE — Therapy (Signed)
[facility] [address] [place], [place], [address] Phone: [phone]   Fax:  [phone]  Physical Therapy Evaluation  Patient Details  Name: Jean Davidson MRN: [id number] Date of Birth: [birth year] Referring Provider (PT): Dr. [name]   Encounter Date: 08/06/2019  PT End of Session - 08/06/19 0916    Visit Number  1    Date for PT Re-Evaluation  10/01/19    Authorization Type  [employer]    PT Start Time  0830    PT Stop Time  0915    PT Time Calculation (min)  45 min    Activity Tolerance  Patient tolerated treatment well    Behavior During Therapy  [facility] [address] for tasks assessed/performed       Past Medical History:  Diagnosis Date  . Anemia   . Anxiety   . Blood transfusion without reported diagnosis   . Carotid stenosis    Mild bilateral  . Carpal tunnel syndrome   . Cervical disc disease    c7  . Cough 07/27/2015  . Disc degeneration   . Dysuria 07/26/2015  . 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 Fatigue 01/26/2015  . Fever 07/27/2015  . GERD (gastroesophageal reflux disease)   . 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 Hypertension    Borderline.  . Internal and external hemorrhoids without complication   . 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 Multiple myeloma ([place])   . Pinched nerve   . Sinusitis, bacterial 07/27/2015  . Spinal stenosis in cervical region   . Varicose veins of lower extremities with inflammation     Past Surgical History:  Procedure Laterality Date  . BLADDER SUSPENSION  1988/1989   x2  . COLONOSCOPY    . ESOPHAGOGASTRODUODENOSCOPY    . IR IMAGING GUIDED PORT INSERTION  03/01/2018  . PORT-A-CATH REMOVAL    . SHOULDER SURGERY Right 04/06/13   right    There were no vitals filed for this visit.   Subjective Assessment - 08/06/19 0832    Subjective  Patient reports issues for 30 years. Patient had 1 child  vaginally. Patient had delivery with forceps and afterwards 6 months later was having leakage. Patient had an unsuccessful to have a bladder tact and did not help. Patient had laproscopic surgery with another lift which helped. As time went on she started to have issues. Patient tried a pessary which helped. 2015 she was diagnosed with multiple myeloma. Patient has a prolapsed bladder. Tried another pessary that fell out. When patient gets up for the bathroom she has to rush. Patient will have the urge to have a bowel movement and nothing comes out. Patient wears a light day pad.    Patient Stated Goals  improved of control of urine and bowel    Currently in Pain?  No/denies    Multiple Pain Sites  No         [facility] PT Assessment - 08/06/19 0001      Assessment   Medical Diagnosis  M62.89 Pelvic floor Dysfunction in female    Referring Provider (PT)  Dr. [name]    Onset Date/Surgical Date  --   chronic   Prior Therapy  none      Precautions   Precautions  Other (comment)    Precaution Comments  Multiple myeloma      Restrictions   Weight Bearing Restrictions  No      Balance Screen   Has the patient fallen in the past 6 months  No    Has the patient had a decrease in activity level because of a fear of falling?   No    Is the patient reluctant to leave their home because of a fear of falling?   No      Home Film/video editor residence      Prior Function   Level of Independence  Independent    Vocation  Retired    Leisure  prior to [employer] 3 exercises classes per week      Cognition   Overall Cognitive Status  Within Functional Limits for tasks assessed      Observation/Other Assessments   Focus on Therapeutic Outcomes (FOTO)   PFIQ-7 12%      Posture/Postural Control   Posture/Postural Control  Postural limitations    Postural Limitations  Rounded Shoulders;Forward head    Posture Comments  slumped posture      ROM / Strength   AROM / PROM /  Strength  AROM;PROM;Strength      AROM   Lumbar Extension  decreased by 50%    Lumbar - Right Side Bend  decreased by 25%    Lumbar - Left Side Bend  decreased by 25%      Strength   Overall Strength Comments  abdominal strength is 2/5    Right Hip Flexion  4/5    Right Hip Extension  4/5    Right Hip ABduction  3/5    Left Hip Flexion  4/5    Left Hip Extension  4/5    Left Hip ABduction  3/5                Objective measurements completed on examination: See above findings.    Pelvic Floor Special Questions - 08/06/19 0001    Prior Pregnancies  Yes    Number of Pregnancies  1    Number of Vaginal Deliveries  1    Any difficulty with labor and deliveries  Yes   used forceps   Currently Sexually Active  No    Urinary Leakage  Yes    Pad use  1 pad at night, 1 pad during the day    Activities that cause leaking  Other    Other activities that cause leaking  get up in the morning and will leak going to the bathroom    Fecal incontinence  No    Skin Integrity  Erthema   posteriorly   Perineal Body/Introitus   Normal    Prolapse  Anterior Wall   just above the introitus   Pelvic Floor Internal Exam  Patient confirms identification and approves PT to assess pelvic floor and treatment    Exam Type  Vaginal    Palpation  tenderness located on the posterior fourchette, along the introitus    Strength  weak squeeze, no lift    Strength # of reps  4    Strength # of seconds  3       [facility] Adult PT Treatment/Exercise - 08/06/19 0001      Self-Care   Self-Care  Other Self-Care Comments    Other Self-Care Comments   education on vaginal moisturizers, why to use them and how to adminster them; education on vulvar and perineal body massage to improve blood flow and improve tissue mobility  PT Education - 08/06/19 0915    Education Details  educated on vaginal moisturizers and how to massage the vulva area to promote blood flow    Person(s) Educated   Patient    Methods  Explanation;Demonstration;Verbal cues;Handout    Comprehension  Verbalized understanding       PT Short Term Goals - 08/06/19 0923      PT SHORT TERM GOAL #1   Title  independent with intial HEP    Time  4    Period  Weeks    Status  New    Target Date  09/03/19      PT SHORT TERM GOAL #2   Title  understand what moisturizers are for the vaginal area and why it is important to use    Time  4    Period  Weeks    Status  New    Target Date  09/03/19      PT SHORT TERM GOAL #3   Title  understand correct toileting to reduce strain on her prolaps    Time  4    Period  Weeks    Status  New    Target Date  09/03/19        PT Long Term Goals - 08/06/19 0924      PT LONG TERM GOAL #1   Title  independent with advanced HEP    Time  8    Period  Weeks    Status  New    Target Date  10/01/19      PT LONG TERM GOAL #2   Title  pelvic floor strength >/= 3/5 holding for 10 seconds with improved circular contraction and lift    Time  8    Period  Weeks    Status  New    Target Date  10/01/19      PT LONG TERM GOAL #3   Title  able to walk to the bathroom without leaking due to improve control and coordination of the pelvic floor muscles    Time  8    Period  Weeks    Status  New    Target Date  10/01/19      PT LONG TERM GOAL #4   Title  core strength and hip strength improved so she is able to move in bed with greater ease    Time  8    Period  Weeks    Status  New    Target Date  10/01/19      PT LONG TERM GOAL #5   Title  understand ways to manage her prolapse    Time  8    Period  Weeks    Status  New    Target Date  10/01/19             Plan - 08/06/19 0916    Clinical Impression Statement  Patient is a 74 year old female with urinary leakage, pelvic prolapse, and vaginal dryness. Patient has multiple Myeloma and 2 bladder tacts. Patient pelvic floor strength is 2/5 holding for 3 seconds. Patient has tenderness located on the  perineal body and along the posterior introitus. Patient has slight redness in the vulvar area. Patient has difficulty with contracting the left urethra sphincter and sides of the introitus. Patient wears 1 pad during the day and 1 during the night. Patient will leak urine when she has the urge and is walking to the bathroom. Patient has core and hip  weakness. Patient used to workout at the gym prior to [place]. Patient is not able to now due to her being a high risk due to her co-morbidities. Patient will benefit from skilled therapy to improve pelvic floor coordination and strength while educating on vaginal health.    Personal Factors and Comorbidities  Age;Fitness;Comorbidity 3+;Time since onset of injury/illness/exacerbation    Comorbidities  Multiple Myeloma; Bladder suspension 2x; Right total shoulder arthroplasty; Cervical stenosis;    Examination-Activity Limitations  Continence;Toileting;Bed Mobility    Examination-Participation Restrictions  Community Activity    Stability/Clinical Decision Making  Evolving/Moderate complexity    Clinical Decision Making  Moderate    Rehab Potential  Good    PT Frequency  1x / week    PT Duration  8 weeks    PT Treatment/Interventions  Biofeedback;Electrical Stimulation;Therapeutic activities;Therapeutic exercise;Neuromuscular re-education;Manual techniques;Patient/family education;Passive range of motion    PT Next Visit Plan  internal soft tissue work, hip stretches, hip strength, core strength; see how she is doing with the moisturizers; toileting    Consulted and Agree with Plan of Care  Patient       Patient will benefit from skilled therapeutic intervention in order to improve the following deficits and impairments:  Decreased coordination, Decreased range of motion, Increased fascial restricitons, Decreased endurance, Decreased activity tolerance, Decreased strength, Decreased mobility  Visit Diagnosis: Muscle weakness (generalized)  Unspecified  lack of coordination  Stress incontinence (female) (female)     Problem List Patient Active Problem List   Diagnosis Date Noted  . Pelvic floor dysfunction in female 07/09/2019  . Alopecia 07/09/2019  . Squamous cell cancer of skin of left temple 06/04/2019  . Exertional shortness of breath 02/14/2019  . Vitamin B12 deficiency without anemia 09/25/2018  . Large hiatal hernia 07/26/2018  . Belching 07/26/2018  . Early satiety 07/26/2018  . Lung nodule seen on imaging study 06/25/2018  . Peripheral neuropathy due to chemotherapy ([place]) 06/11/2018  . Tinnitus of both ears 04/10/2018  . Preventive measure 04/10/2018  . Goals of care, counseling/discussion 02/20/2018  . Pain of left scapula 12/06/2017  . Vitamin D deficiency 11/12/2017  . Yeast infection of the skin 09/06/2017  . Family hx of colon cancer requiring screening colonoscopy 01/17/2017  . Cystocele and rectocele with incomplete uterovaginal prolapse 11/22/2016  . 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 Bilateral pleural effusion 01/25/2016  . Pericardial effusion 01/24/2016  . Physical deconditioning 01/11/2016  . 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 Therapeutic drug monitoring 11/08/2015  . Red blood cell antibody positive, compatible PRBC difficult to obtain 07/30/2015  . 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 Fatigue 01/26/2015  . Essential hypertension 01/26/2015  . Other fatigue 01/26/2015  . 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 Chronic recurrent sinusitis 10/23/2014  . Conjunctivitis 10/23/2014  . 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 Leukopenia due to antineoplastic chemotherapy ([place]) 08/10/2014  . Patient in clinical research study 07/21/2014  . Need for prophylactic vaccination and inoculation against influenza 02/24/2014  . Leukocytosis, unspecified 02/03/2014  . Constipation 11/21/2013  . Rash  11/21/2013  . Back pain 11/21/2013  . Anxiety 11/21/2013  . Multiple myeloma ([place]) 11/07/2013  . Thrombocytopenia ([place]) 10/29/2013  . Persistent cough for 3 weeks or longer 10/02/2013  . Abnormal weight loss 10/02/2013  . Normocytic anemia 08/22/2013  . Abdominal bloating 08/22/2013  . Hypersensitivity 08/07/2013  . Other malaise and fatigue 08/07/2013  . Osteopenia 08/05/2013  . GERD (gastroesophageal reflux disease) 01/16/2013  .  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 Carotid stenosis   . Cervical disc disease   . Hypertension 04/15/2012  . Palpitations 03/29/2012    Earlie Counts, PT 08/06/19 9:29 AM    [facility] [address], [place], [place], [address] Phone: [phone]   Fax:  [phone]  Name: KELISSA MERLIN MRN: [id number] Date of Birth: April 11, 1946

## 2019-08-07 ENCOUNTER — Other Ambulatory Visit: Payer: Self-pay

## 2019-08-07 ENCOUNTER — Inpatient Hospital Stay: Payer: Medicare HMO | Admitting: Hematology and Oncology

## 2019-08-07 ENCOUNTER — Other Ambulatory Visit: Payer: Self-pay | Admitting: Hematology and Oncology

## 2019-08-07 ENCOUNTER — Inpatient Hospital Stay: Payer: Medicare HMO

## 2019-08-07 ENCOUNTER — Encounter: Payer: Self-pay | Admitting: Hematology and Oncology

## 2019-08-07 ENCOUNTER — Inpatient Hospital Stay: Payer: Medicare HMO | Attending: Hematology and Oncology

## 2019-08-07 VITALS — BP 126/73 | HR 64 | Temp 98.0°F | Resp 17

## 2019-08-07 DIAGNOSIS — Z299 Encounter for prophylactic measures, unspecified: Secondary | ICD-10-CM | POA: Diagnosis not present

## 2019-08-07 DIAGNOSIS — Z79899 Other long term (current) drug therapy: Secondary | ICD-10-CM | POA: Insufficient documentation

## 2019-08-07 DIAGNOSIS — C9002 Multiple myeloma in relapse: Secondary | ICD-10-CM

## 2019-08-07 DIAGNOSIS — Z9221 Personal history of antineoplastic chemotherapy: Secondary | ICD-10-CM | POA: Diagnosis not present

## 2019-08-07 DIAGNOSIS — L989 Disorder of the skin and subcutaneous tissue, unspecified: Secondary | ICD-10-CM | POA: Diagnosis not present

## 2019-08-07 DIAGNOSIS — D469 Myelodysplastic syndrome, unspecified: Secondary | ICD-10-CM

## 2019-08-07 DIAGNOSIS — C946 Myelodysplastic disease, not classified: Secondary | ICD-10-CM

## 2019-08-07 DIAGNOSIS — R5381 Other malaise: Secondary | ICD-10-CM

## 2019-08-07 DIAGNOSIS — C9000 Multiple myeloma not having achieved remission: Secondary | ICD-10-CM

## 2019-08-07 DIAGNOSIS — Z5112 Encounter for antineoplastic immunotherapy: Secondary | ICD-10-CM | POA: Diagnosis not present

## 2019-08-07 DIAGNOSIS — Z7982 Long term (current) use of aspirin: Secondary | ICD-10-CM | POA: Diagnosis not present

## 2019-08-07 DIAGNOSIS — Z9481 Bone marrow transplant status: Secondary | ICD-10-CM | POA: Diagnosis not present

## 2019-08-07 LAB — CBC WITH DIFFERENTIAL/PLATELET
Abs Immature Granulocytes: 0.01 10*3/uL (ref 0.00–0.07)
Basophils Absolute: 0 10*3/uL (ref 0.0–0.1)
Basophils Relative: 1 %
Eosinophils Absolute: 0 10*3/uL (ref 0.0–0.5)
Eosinophils Relative: 0 %
HCT: 39.4 % (ref 36.0–46.0)
Hemoglobin: 13 g/dL (ref 12.0–15.0)
Immature Granulocytes: 0 %
Lymphocytes Relative: 42 %
Lymphs Abs: 1.8 10*3/uL (ref 0.7–4.0)
MCH: 35.1 pg — ABNORMAL HIGH (ref 26.0–34.0)
MCHC: 33 g/dL (ref 30.0–36.0)
MCV: 106.5 fL — ABNORMAL HIGH (ref 80.0–100.0)
Monocytes Absolute: 0.5 10*3/uL (ref 0.1–1.0)
Monocytes Relative: 11 %
Neutro Abs: 2.1 10*3/uL (ref 1.7–7.7)
Neutrophils Relative %: 46 %
Platelets: 228 10*3/uL (ref 150–400)
RBC: 3.7 MIL/uL — ABNORMAL LOW (ref 3.87–5.11)
RDW: 13.7 % (ref 11.5–15.5)
WBC: 4.4 10*3/uL (ref 4.0–10.5)
nRBC: 0 % (ref 0.0–0.2)

## 2019-08-07 LAB — COMPREHENSIVE METABOLIC PANEL
ALT: 10 U/L (ref 0–44)
AST: 8 U/L — ABNORMAL LOW (ref 15–41)
Albumin: 3.5 g/dL (ref 3.5–5.0)
Alkaline Phosphatase: 78 U/L (ref 38–126)
Anion gap: 8 (ref 5–15)
BUN: 13 mg/dL (ref 8–23)
CO2: 27 mmol/L (ref 22–32)
Calcium: 8.8 mg/dL — ABNORMAL LOW (ref 8.9–10.3)
Chloride: 106 mmol/L (ref 98–111)
Creatinine, Ser: 0.71 mg/dL (ref 0.44–1.00)
GFR calc Af Amer: >60 mL/min (ref >60)
GFR calc non Af Amer: >60 mL/min (ref >60)
Glucose, Bld: 88 mg/dL (ref 70–99)
Potassium: 4.1 mmol/L (ref 3.5–5.1)
Sodium: 141 mmol/L (ref 135–145)
Total Bilirubin: 0.3 mg/dL (ref 0.3–1.2)
Total Protein: 6.2 g/dL — ABNORMAL LOW (ref 6.5–8.1)

## 2019-08-07 MED ORDER — SODIUM CHLORIDE 0.9 % IV SOLN
16.0000 mg/kg | Freq: Once | INTRAVENOUS | Status: AC
  Administered 2019-08-07: 1200 mg via INTRAVENOUS
  Filled 2019-08-07: qty 60

## 2019-08-07 MED ORDER — PROCHLORPERAZINE MALEATE 10 MG PO TABS
ORAL_TABLET | ORAL | Status: AC
  Filled 2019-08-07: qty 1

## 2019-08-07 MED ORDER — DIPHENHYDRAMINE HCL 25 MG PO CAPS
ORAL_CAPSULE | ORAL | Status: AC
  Filled 2019-08-07: qty 1

## 2019-08-07 MED ORDER — SODIUM CHLORIDE 0.9% FLUSH
10.0000 mL | Freq: Once | INTRAVENOUS | Status: AC
  Administered 2019-08-07: 10 mL
  Filled 2019-08-07: qty 10

## 2019-08-07 MED ORDER — SODIUM CHLORIDE 0.9% FLUSH
10.0000 mL | INTRAVENOUS | Status: DC | PRN
  Administered 2019-08-07: 10 mL
  Filled 2019-08-07: qty 10

## 2019-08-07 MED ORDER — HEPARIN SOD (PORK) LOCK FLUSH 100 UNIT/ML IV SOLN
500.0000 [IU] | Freq: Once | INTRAVENOUS | Status: AC | PRN
  Administered 2019-08-07: 500 [IU]
  Filled 2019-08-07: qty 5

## 2019-08-07 MED ORDER — DEXAMETHASONE SODIUM PHOSPHATE 10 MG/ML IJ SOLN
10.0000 mg | Freq: Once | INTRAMUSCULAR | Status: AC
  Administered 2019-08-07: 10 mg via INTRAVENOUS

## 2019-08-07 MED ORDER — DEXAMETHASONE SODIUM PHOSPHATE 10 MG/ML IJ SOLN
INTRAMUSCULAR | Status: AC
  Filled 2019-08-07: qty 1

## 2019-08-07 MED ORDER — ACETAMINOPHEN 325 MG PO TABS
ORAL_TABLET | ORAL | Status: AC
  Filled 2019-08-07: qty 2

## 2019-08-07 MED ORDER — DIPHENHYDRAMINE HCL 25 MG PO CAPS
25.0000 mg | ORAL_CAPSULE | Freq: Once | ORAL | Status: AC
  Administered 2019-08-07: 25 mg via ORAL

## 2019-08-07 MED ORDER — PROCHLORPERAZINE MALEATE 10 MG PO TABS
10.0000 mg | ORAL_TABLET | Freq: Once | ORAL | Status: AC
  Administered 2019-08-07: 10 mg via ORAL

## 2019-08-07 MED ORDER — ACETAMINOPHEN 325 MG PO TABS
650.0000 mg | ORAL_TABLET | Freq: Once | ORAL | Status: AC
  Administered 2019-08-07: 650 mg via ORAL

## 2019-08-07 MED ORDER — ZOLEDRONIC ACID 4 MG/100ML IV SOLN
4.0000 mg | Freq: Once | INTRAVENOUS | Status: AC
  Administered 2019-08-07: 4 mg via INTRAVENOUS

## 2019-08-07 MED ORDER — ZOLEDRONIC ACID 4 MG/100ML IV SOLN
INTRAVENOUS | Status: AC
  Filled 2019-08-07: qty 100

## 2019-08-07 MED ORDER — SODIUM CHLORIDE 0.9 % IV SOLN
Freq: Once | INTRAVENOUS | Status: AC
  Filled 2019-08-07: qty 250

## 2019-08-07 NOTE — Patient Instructions (Addendum)
[facility] Discharge Instructions for Patients Receiving Chemotherapy  Today you received the following chemotherapy agents: Darzalex.  To help prevent nausea and vomiting after your treatment, we encourage you to take your nausea medication as directed.   If you develop nausea and vomiting that is not controlled by your nausea medication, call the clinic.   BELOW ARE SYMPTOMS THAT SHOULD BE REPORTED IMMEDIATELY:  *FEVER GREATER THAN 100.5 F  *CHILLS WITH OR WITHOUT FEVER  NAUSEA AND VOMITING THAT IS NOT CONTROLLED WITH YOUR NAUSEA MEDICATION  *UNUSUAL SHORTNESS OF BREATH  *UNUSUAL BRUISING OR BLEEDING  TENDERNESS IN MOUTH AND THROAT WITH OR WITHOUT PRESENCE OF ULCERS  *URINARY PROBLEMS  *BOWEL PROBLEMS  UNUSUAL RASH Items with * indicate a potential emergency and should be followed up as soon as possible.  Feel free to call the clinic should you have any questions or concerns. The clinic phone number is [phone].  Please show the [facility] at check-in to the Emergency Department and triage nurse.  Zoledronic Acid injection (Hypercalcemia, Oncology) What is this medicine? ZOLEDRONIC ACID (ZOE le dron ik AS id) lowers the amount of calcium loss from bone. It is used to treat too much calcium in your blood from cancer. It is also used to prevent complications of cancer that has spread to the bone. This medicine may be used for other purposes; ask your health care provider or pharmacist if you have questions. COMMON BRAND NAME(S): Zometa What should I tell my health care provider before I take this medicine? They need to know if you have any of these conditions:  aspirin-sensitive asthma  cancer, especially if you are receiving medicines used to treat cancer  dental disease or wear dentures  infection  kidney disease  receiving corticosteroids like dexamethasone or prednisone  an unusual or allergic reaction to zoledronic acid, other  medicines, foods, dyes, or preservatives  pregnant or trying to get pregnant  breast-feeding How should I use this medicine? This medicine is for infusion into a vein. It is given by a health care professional in a hospital or clinic setting. Talk to your pediatrician regarding the use of this medicine in children. Special care may be needed. Overdosage: If you think you have taken too much of this medicine contact a poison control center or emergency room at once. NOTE: This medicine is only for you. Do not share this medicine with others. What if I miss a dose? It is important not to miss your dose. Call your doctor or health care professional if you are unable to keep an appointment. What may interact with this medicine?  certain antibiotics given by injection  NSAIDs, medicines for pain and inflammation, like ibuprofen or naproxen  some diuretics like bumetanide, furosemide  teriparatide  thalidomide This list may not describe all possible interactions. Give your health care provider a list of all the medicines, herbs, non-prescription drugs, or dietary supplements you use. Also tell them if you smoke, drink alcohol, or use illegal drugs. Some items may interact with your medicine. What should I watch for while using this medicine? Visit your doctor or health care professional for regular checkups. It may be some time before you see the benefit from this medicine. Do not stop taking your medicine unless your doctor tells you to. Your doctor may order blood tests or other tests to see how you are doing. Women should inform their doctor if they wish to become pregnant or think they might be pregnant.  There is a potential for serious side effects to an unborn child. Talk to your health care professional or pharmacist for more information. You should make sure that you get enough calcium and vitamin D while you are taking this medicine. Discuss the foods you eat and the vitamins you take  with your health care professional. Some people who take this medicine have severe bone, joint, and/or muscle pain. This medicine may also increase your risk for jaw problems or a broken thigh bone. Tell your doctor right away if you have severe pain in your jaw, bones, joints, or muscles. Tell your doctor if you have any pain that does not go away or that gets worse. Tell your dentist and dental surgeon that you are taking this medicine. You should not have major dental surgery while on this medicine. See your dentist to have a dental exam and fix any dental problems before starting this medicine. Take good care of your teeth while on this medicine. Make sure you see your dentist for regular follow-up appointments. What side effects may I notice from receiving this medicine? Side effects that you should report to your doctor or health care professional as soon as possible:  allergic reactions like skin rash, itching or hives, swelling of the face, lips, or tongue  anxiety, confusion, or depression  breathing problems  changes in vision  eye pain  feeling faint or lightheaded, falls  jaw pain, especially after dental work  mouth sores  muscle cramps, stiffness, or weakness  redness, blistering, peeling or loosening of the skin, including inside the mouth  trouble passing urine or change in the amount of urine Side effects that usually do not require medical attention (report to your doctor or health care professional if they continue or are bothersome):  bone, joint, or muscle pain  constipation  diarrhea  fever  hair loss  irritation at site where injected  loss of appetite  nausea, vomiting  stomach upset  trouble sleeping  trouble swallowing  weak or tired This list may not describe all possible side effects. Call your doctor for medical advice about side effects. You may report side effects to FDA at 1-800-FDA-1088. Where should I keep my medicine? This drug  is given in a hospital or clinic and will not be stored at home. NOTE: This sheet is a summary. It may not cover all possible information. If you have questions about this medicine, talk to your doctor, pharmacist, or health care provider.  2020 Elsevier/Gold Standard (2013-11-08 14:19:39)  

## 2019-08-07 NOTE — Assessment & Plan Note (Signed)
She has appointment to see physical therapist for deconditioning work-up and pelvic floor dysfunction

## 2019-08-07 NOTE — Progress Notes (Signed)
[place] OFFICE PROGRESS NOTE  Patient Care Team: [name], [name], MD as PCP - General (Internal Medicine) [name], MD as PCP - Cardiology (Cardiology) [name], MD as Referring Physician (Hematology and Oncology) [name], MD as Consulting Physician (Internal Medicine) [name], [name], MD as Consulting Physician (Infectious Diseases) [name], MD as Consulting Physician (Hematology and Oncology) [name], NP as Nurse Practitioner (Hematology and Oncology)  ASSESSMENT & PLAN:  Multiple myeloma [facility]) I have reviewed her recent work-up at [facility] The patient appears to be in complete remission with negative MRD The patient is undecided about her future long-term plan of care She has discussed with her transplant physician the possibility of omitting daratumumab but that was not being reflected in her recent office visit notes For now, she will proceed with treatment as scheduled and she will think about her next plan and will call me with her decision  She remains on acyclovir for antimicrobial prophylaxis She will continue aspirin therapy for DVT prophylaxis She will continue calcium and vitamin D along with Zometa  Preventive measure We discussed the risk and benefits of Covid vaccination There is no contraindication for her to proceed  Physical deconditioning She has appointment to see physical therapist for deconditioning work-up and pelvic floor dysfunction  Skin lesion of back Biopsy result is pending The area of recent skin biopsy is healing well She is reassured   No orders of the defined types were placed in this encounter.   All questions were answered. The patient knows to call the clinic with any problems, questions or concerns. The total time spent in the appointment was 20 minutes encounter with patients including review of chart and various tests results, discussions about plan of  care and coordination of care plan   [name], MD 08/07/2019 9:24 AM  INTERVAL HISTORY: Please see below for problem oriented charting. She returns for follow-up and treatment for multiple myeloma I have reviewed outside records extensively and collaborated with the patient From what I gather, she initially thought that it is okay to discontinue daratumumab but after much discussion with her transplant physician, the impression that was reflected on the notes is to continue treatment She has no side effects from treatment so far She had recent skin biopsy by dermatologist No recent dental issues She is undergoing physical therapy for deconditioning and pelvic floor dysfunction  SUMMARY OF ONCOLOGIC HISTORY: Oncology History Overview Note  Multiple myeloma, Ig A Lambda, M spike 3.54 grams, Calcium 9.2, Creatinine 0.8, Beta 2 microglobulin 4.52, IgA 4840 mg/dL, lambda light chain 75.4, albumin 3.6, hemoglobin 9.7, platelet 115    Primary site: Multiple Myeloma   Staging method: AJCC 6th Edition   Clinical: Stage IIA signed by [name], MD on 11/07/2013  2:46 PM   Summary: Stage IIA      Multiple myeloma ([place])  10/31/2013 Bone Marrow Biopsy   Bone marrow biopsy confirmed multiple myeloma with 40% bone marrow involvement. Skeletal survey showed minimal lesions in her score with generalized demineralization   11/10/2013 - 02/13/2014 Chemotherapy   The patient is started on induction chemotherapy with weekly dexamethasone 40 mg by mouth as well as Velcade subcutaneous injection on days 1, 4, 8 and 11. On 11/21/2013, she was started on monthly Zometa.   12/23/2013 Adverse Reaction   The dose of Velcade was reduced due to thrombocytopenia.   01/28/2014 - 04/07/2014 Chemotherapy   Revlimid is added. Treatment was discontinued  due to lack of response.   02/24/2014 - 04/07/2014 Chemotherapy   Due to worsening peripheral neuropathy, Velcade injection is changed to once a week. Revlimid was  given 21 days on, 7 days off.   04/07/2014 - 04/10/2014 Chemotherapy   Revlimid was discontinued due to lack of response. Chemotherapy was changed back to Velcade injection twice a week, 2 weeks on 1 week off. Her treatment was switched to to minimum response   04/20/2014 - 06/02/2014 Chemotherapy   chemotherapy is switched to Carfilzomib, Cytoxan and dexamethasone.   04/22/2014 Procedure   she has placement of port for chemotherapy.   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 at [facility]   10/20/2014 - 03/24/2015 Chemotherapy    she received chemotherapy with Kyprolis, Revlimid and dexamethasone   10/22/2014 Procedure   She has port placement   01/19/2015 Tumor Marker   IgA lambda M spike at 0.4 g    01/20/2015 Miscellaneous   IVIG monthly was added for recurrent infections   02/02/2015 Miscellaneous   She received GCSF for severe neutropenia   02/26/2015 Bone Marrow Biopsy    she had bone marrow biopsy done at [facility] which showed mild pancytopenia but not diagnostic for myelodysplastic syndrome or multiple myeloma   07/22/2015 - 09/21/2015 Chemotherapy   She is receiving Daratumumab at [facility] due to relapsed myeloma   08/03/2015 - 08/06/2015 Hospital Admission   She was admitted to the hospital for neutropenic fever. No cource was found and fever resolved with IV vancomycin and meropenem   09/13/2015 Bone Marrow Biopsy   Bone marrow biopsy showed no increased blasts, 3-4 % plasma cells   03/02/2016 Bone Marrow Biopsy   Bone marrow biopsy at [facility] showed normocellular (30%) bone marrow with trilineage  hematopoiesis. No significant increase in blasts. No significant increase in plasma cells.   05/12/2016 Imaging   DEXA scan at [place] showed osteopenia   10/24/2016 Imaging   Skeletal survey at [facility], no new lesions   12/07/2017 Imaging   No focal abnormality noted to suggest myeloma. Exam is stable from prior exam.   03/01/2018 Procedure   Successful 8 French right internal jugular vein power port placement with its tip at the SVC/RA junction.   03/06/2018 -  Chemotherapy   The patient had daratumumab    07/09/2018 Bone Marrow Biopsy   Bone marrow biopsy at [facility] showed residual disease at 0.004% plasma cells   07/03/2019 Bone Marrow Biopsy   A. Bone marrow, flow cytometric analysis for multiple myeloma minimal residual disease detection:   Negative. No phenotypically abnormal plasma cells at or above the limit of detection identified.     No monotypic B-cell population identified. Negative for increased blasts.   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   10/31/2015 Miscellaneous   She has engrafted     REVIEW OF SYSTEMS:   Constitutional: Denies fevers, chills or abnormal weight loss Eyes: Denies blurriness of vision Ears, nose, mouth, throat, and face: Denies mucositis or sore throat Respiratory: Denies cough, dyspnea or wheezes Cardiovascular: Denies palpitation, chest discomfort or lower  extremity swelling Gastrointestinal:  Denies nausea, heartburn or change in bowel habits Skin: Denies abnormal skin rashes Lymphatics: Denies new lymphadenopathy or easy bruising Neurological:Denies numbness, tingling or new weaknesses Behavioral/Psych: Mood is stable, no new changes  All other systems were reviewed with the patient and are negative.  I have reviewed the past medical history, past surgical history, social history and family history with the patient and they are unchanged from previous note.  ALLERGIES:  has No Known Allergies.  MEDICATIONS:  Current Outpatient Medications  Medication Sig Dispense Refill  . acyclovir (ZOVIRAX) 400 MG tablet TAKE 1 TABLET BY MOUTH TWICE A DAY 180 tablet 11  . aspirin 81 MG chewable tablet Chew 325 mg by mouth daily. Takes 4 tablets (325 mg daily) 21 days out of the month with chemo drug    . calcipotriene (DOVONOX) 0.005 % cream daily.     . calcium carbonate (TUMS - DOSED IN MG ELEMENTAL CALCIUM) 500 MG chewable tablet Chew 1 tablet by mouth daily.    . Cholecalciferol (VITAMIN D-1000 MAX ST) 1000 units tablet Take 1,000 Units by mouth daily.    . cyanocobalamin 1000 MCG tablet Take 1,000 mcg by mouth daily.    Marland Kitchen DARATUMUMAB IV Inject into the vein every 30 (thirty) days.    Marland Kitchen docusate sodium (COLACE) 100 MG capsule Take 100 mg by mouth daily as needed for mild constipation.     Marland Kitchen estradiol (ESTRACE VAGINAL) 0.1 MG/GM vaginal cream Place 1 Applicatorful vaginally at bedtime. 42.5 g 12  . loperamide (IMODIUM) 2 MG capsule Take by mouth as needed for diarrhea or loose stools. As needed only    . metoprolol tartrate (LOPRESSOR) 25 MG tablet Take 37.5 mg by mouth 2 (two) times daily.    . mirtazapine (REMERON) 15 MG tablet TAKE 1 TABLET BY MOUTH EVERYDAY AT BEDTIME 90 tablet 1  . Multiple Vitamin (MULTIVITAMIN WITH MINERALS) TABS tablet Take 1 tablet by mouth daily.    . ondansetron (ZOFRAN) 8 MG tablet Take 1 tablet (8 mg total) by mouth  every 8 (eight) hours as needed (Nausea or vomiting). 30 tablet 1  . pantoprazole (PROTONIX) 40 MG tablet TAKE 1 TABLET (40 MG TOTAL) BY MOUTH 2 (TWO) TIMES DAILY. TAKE 30-60 MINUTES BEFORE BREAKFAST AND DINNER 180 tablet 2  . pomalidomide (POMALYST) 2 MG capsule Take 1 capsule (2 mg total) by mouth daily. Take with water on days 1-21. Repeat every 28 days. 21 capsule 0  . prochlorperazine (COMPAZINE) 10 MG tablet Take 1 tablet (10 mg total) by mouth every 6 (six) hours as needed (Nausea or vomiting). 30 tablet 1   No current facility-administered medications for this visit.    PHYSICAL EXAMINATION: ECOG PERFORMANCE STATUS: 0 - Asymptomatic  Vitals:   08/07/19 0846  BP: 126/78  Pulse: 68  Resp: 17  Temp: 98.2 F (36.8 C)  SpO2: 99%   Filed Weights   08/07/19 0846  Weight: 164 lb (74.4 kg)    GENERAL:alert, no distress and comfortable SKIN: skin color,  texture, turgor are normal, no rashes or significant lesions EYES: normal, Conjunctiva are pink and non-injected, sclera clear OROPHARYNX:no exudate, no erythema and lips, buccal mucosa, and tongue normal  NECK: supple, thyroid normal size, non-tender, without nodularity LYMPH:  no palpable lymphadenopathy in the cervical, axillary or inguinal LUNGS: clear to auscultation and percussion with normal breathing effort HEART: regular rate & rhythm and no murmurs and no lower extremity edema ABDOMEN:abdomen soft, non-tender and normal bowel sounds Musculoskeletal:no cyanosis of digits and no clubbing  NEURO: alert & oriented x 3 with fluent speech, no focal motor/sensory deficits  LABORATORY DATA:  I have reviewed the data as listed    Component Value Date/Time   NA 141 08/07/2019 0812   NA 142 01/04/2017 0902   K 4.1 08/07/2019 0812   K 3.9 01/04/2017 0902   CL 106 08/07/2019 0812   CO2 27 08/07/2019 0812   CO2 28 01/04/2017 0902   GLUCOSE 88 08/07/2019 0812   GLUCOSE 92 01/04/2017 0902   BUN 13 08/07/2019 0812   BUN 9.5  01/04/2017 0902   CREATININE 0.71 08/07/2019 0812   CREATININE 0.80 12/18/2018 0901   CREATININE 0.7 01/04/2017 0902   CALCIUM 8.8 (L) 08/07/2019 0812   CALCIUM 9.0 01/04/2017 0902   PROT 6.2 (L) 08/07/2019 0812   PROT 6.0 (L) 01/04/2017 0902   PROT 5.8 (L) 01/04/2017 0902   ALBUMIN 3.5 08/07/2019 0812   ALBUMIN 3.4 (L) 01/04/2017 0902   AST 8 (L) 08/07/2019 0812   AST 18 12/18/2018 0901   AST 21 01/04/2017 0902   ALT 10 08/07/2019 0812   ALT 14 12/18/2018 0901   ALT 16 01/04/2017 0902   ALKPHOS 78 08/07/2019 0812   ALKPHOS 101 01/04/2017 0902   BILITOT 0.3 08/07/2019 0812   BILITOT 0.3 12/18/2018 0901   BILITOT 0.56 01/04/2017 0902   GFRNONAA >60 08/07/2019 0812   GFRNONAA >60 12/18/2018 0901   GFRAA >60 08/07/2019 0812   GFRAA >60 12/18/2018 0901    No results found for: SPEP, UPEP  Lab Results  Component Value Date   WBC 4.4 08/07/2019   NEUTROABS 2.1 08/07/2019   HGB 13.0 08/07/2019   HCT 39.4 08/07/2019   MCV 106.5 (H) 08/07/2019   PLT 228 08/07/2019      Chemistry      Component Value Date/Time   NA 141 08/07/2019 0812   NA 142 01/04/2017 0902   K 4.1 08/07/2019 0812   K 3.9 01/04/2017 0902   CL 106 08/07/2019 0812   CO2 27 08/07/2019 0812   CO2 28 01/04/2017 0902   BUN 13 08/07/2019 0812   BUN 9.5 01/04/2017 0902   CREATININE 0.71 08/07/2019 0812   CREATININE 0.80 12/18/2018 0901   CREATININE 0.7 01/04/2017 0902      Component Value Date/Time   CALCIUM 8.8 (L) 08/07/2019 0812   CALCIUM 9.0 01/04/2017 0902   ALKPHOS 78 08/07/2019 0812   ALKPHOS 101 01/04/2017 0902   AST 8 (L) 08/07/2019 0812   AST 18 12/18/2018 0901   AST 21 01/04/2017 0902   ALT 10 08/07/2019 0812   ALT 14 12/18/2018 0901   ALT 16 01/04/2017 0902   BILITOT 0.3 08/07/2019 0812   BILITOT 0.3 12/18/2018 0901   BILITOT 0.56 01/04/2017 0902

## 2019-08-07 NOTE — Assessment & Plan Note (Signed)
I have reviewed her recent work-up at [facility] The patient appears to be in complete remission with negative MRD The patient is undecided about her future long-term plan of care She has discussed with her transplant physician the possibility of omitting daratumumab but that was not being reflected in her recent office visit notes For now, she will proceed with treatment as scheduled and she will think about her next plan and will call me with her decision  She remains on acyclovir for antimicrobial prophylaxis She will continue aspirin therapy for DVT prophylaxis She will continue calcium and vitamin D along with Zometa

## 2019-08-07 NOTE — Assessment & Plan Note (Signed)
We discussed the risk and benefits of Covid vaccination There is no contraindication for her to proceed

## 2019-08-07 NOTE — Assessment & Plan Note (Signed)
Biopsy result is pending The area of recent skin biopsy is healing well She is reassured

## 2019-08-08 ENCOUNTER — Encounter: Payer: Self-pay | Admitting: Hematology and Oncology

## 2019-08-08 LAB — KAPPA/LAMBDA LIGHT CHAINS
Kappa free light chain: 22.6 mg/L — ABNORMAL HIGH (ref 3.3–19.4)
Kappa, lambda light chain ratio: 0.94 (ref 0.26–1.65)
Lambda free light chains: 24.1 mg/L (ref 5.7–26.3)

## 2019-08-11 ENCOUNTER — Encounter: Payer: Self-pay | Admitting: Hematology and Oncology

## 2019-08-11 LAB — MULTIPLE MYELOMA PANEL, SERUM
Albumin SerPl Elph-Mcnc: 3.4 g/dL (ref 2.9–4.4)
Albumin/Glob SerPl: 1.6 (ref 0.7–1.7)
Alpha 1: 0.2 g/dL (ref 0.0–0.4)
Alpha2 Glob SerPl Elph-Mcnc: 0.6 g/dL (ref 0.4–1.0)
B-Globulin SerPl Elph-Mcnc: 0.8 g/dL (ref 0.7–1.3)
Gamma Glob SerPl Elph-Mcnc: 0.5 g/dL (ref 0.4–1.8)
Globulin, Total: 2.2 g/dL (ref 2.2–3.9)
IgA: 229 mg/dL (ref 64–422)
IgG (Immunoglobin G), Serum: 515 mg/dL — ABNORMAL LOW (ref 586–1602)
IgM (Immunoglobulin M), Srm: 57 mg/dL (ref 26–217)
Total Protein ELP: 5.6 g/dL — ABNORMAL LOW (ref 6.0–8.5)

## 2019-08-12 ENCOUNTER — Encounter: Payer: Self-pay | Admitting: Hematology and Oncology

## 2019-08-15 ENCOUNTER — Telehealth: Payer: Self-pay | Admitting: Hematology and Oncology

## 2019-08-15 ENCOUNTER — Encounter: Payer: Self-pay | Admitting: Hematology and Oncology

## 2019-08-15 NOTE — Telephone Encounter (Signed)
Scheduled appts per 2/15 sch msg. Pt is aware of appt date and times

## 2019-08-20 ENCOUNTER — Encounter: Payer: Self-pay | Admitting: Physical Therapy

## 2019-08-20 ENCOUNTER — Other Ambulatory Visit: Payer: Self-pay

## 2019-08-20 ENCOUNTER — Ambulatory Visit: Payer: Medicare HMO | Admitting: Physical Therapy

## 2019-08-20 DIAGNOSIS — N393 Stress incontinence (female) (male): Secondary | ICD-10-CM

## 2019-08-20 DIAGNOSIS — C9002 Multiple myeloma in relapse: Secondary | ICD-10-CM

## 2019-08-20 DIAGNOSIS — M6281 Muscle weakness (generalized): Secondary | ICD-10-CM

## 2019-08-20 DIAGNOSIS — R279 Unspecified lack of coordination: Secondary | ICD-10-CM

## 2019-08-20 MED ORDER — POMALIDOMIDE 2 MG PO CAPS: 2.0000 mg | ORAL_CAPSULE | Freq: Every day | ORAL | 0 refills | Status: DC

## 2019-08-20 NOTE — Therapy (Signed)
[facility] [address], [place], [place], [address] Phone: [phone]   Fax:  [phone]  Physical Therapy Treatment  Patient Details  Name: Jean Davidson MRN: [id number] Date of Birth: [birth year] Referring Provider (PT): Dr. [name]   Encounter Date: 08/20/2019  PT End of Session - 08/20/19 0938    Visit Number  2    Date for PT Re-Evaluation  10/01/19    Authorization Type  [employer]    PT Start Time  0930    PT Stop Time  1010    PT Time Calculation (min)  40 min    Activity Tolerance  Patient tolerated treatment well    Behavior During Therapy  [facility] for tasks assessed/performed       Past Medical History:  Diagnosis Date  . Anemia   . Anxiety   . Blood transfusion without reported diagnosis   . Carotid stenosis    Mild bilateral  . Carpal tunnel syndrome   . Cervical disc disease    c7  . Cough 07/27/2015  . Disc degeneration   . Dysuria 07/26/2015  . Failure of stem cell transplant ([place])   . Fatigue 01/26/2015  . Fever 07/27/2015  . GERD (gastroesophageal reflux disease)   . 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 Hypertension    Borderline.  . Internal and external hemorrhoids without complication   . 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 Multiple myeloma ([place])   . Pinched nerve   . Sinusitis, bacterial 07/27/2015  . Spinal stenosis in cervical region   . Varicose veins of lower extremities with inflammation     Past Surgical History:  Procedure Laterality Date  . BLADDER SUSPENSION  1988/1989   x2  . COLONOSCOPY    . ESOPHAGOGASTRODUODENOSCOPY    . IR IMAGING GUIDED PORT INSERTION  03/01/2018  . PORT-A-CATH REMOVAL    . SHOULDER SURGERY Right 04/06/13   right    There were no vitals filed for this visit.  Subjective Assessment - 08/20/19 0936    Subjective  I did not do much of the therapy since I last seen you. I am  skittish to push the bladder up.    Patient Stated Goals  improved of control of urine and bowel    Currently in Pain?  No/denies                       [facility] Adult PT Treatment/Exercise - 08/20/19 0001      Self-Care   Self-Care  Other Self-Care Comments    Other Self-Care Comments   education on using the estrodial and using the coconut oil on the vulva area, educated patient on pushing the bladder back up when it is hanging down; instuction on laying with hips on pillow to reduce the prolapse      Therapeutic Activites    Therapeutic Activities  Other Therapeutic Activities    Other Therapeutic Activities  instruction on correct toileting technique to relax the anus, correct breathing and knees above the hips, needed verbal cues      Neuro Re-ed    Neuro Re-ed Details   diaphragmatic breathing laying down then sitting      Lumbar Exercises: Aerobic   Nustep  level 1 for 5 minutes while assessing the patient      Lumbar Exercises: Supine   Bridge with [employer]  10 reps;1 second    Bridge with [employer] Limitations  with pelvic floor contraction    Other Supine Lumbar Exercises  ball squeeze holding for 5 second with pelvic floor contraction vc to not squeeze ball hard and breath             PT Education - 08/20/19 1003    Education Details  toileting, pelvic floor exercises, reviewed using coconut oil    Person(s) Educated  Patient    Methods  Explanation;Demonstration;Verbal cues;Handout    Comprehension  Verbalized understanding;Returned demonstration       PT Short Term Goals - 08/06/19 0923      PT SHORT TERM GOAL #1   Title  independent with intial HEP    Time  4    Period  Weeks    Status  New    [place] Date  09/03/19      PT SHORT TERM GOAL #2   Title  understand what moisturizers are for the vaginal area and why it is important to use    Time  4    Period  Weeks    Status  New    Target Date  09/03/19      PT SHORT TERM GOAL #3    Title  understand correct toileting to reduce strain on her prolaps    Time  4    Period  Weeks    Status  New    Target Date  09/03/19        PT Long Term Goals - 08/06/19 0924      PT LONG TERM GOAL #1   Title  independent with advanced HEP    Time  8    Period  Weeks    Status  New    Target Date  10/01/19      PT LONG TERM GOAL #2   Title  pelvic floor strength >/= 3/5 holding for 10 seconds with improved circular contraction and lift    Time  8    Period  Weeks    Status  New    Target Date  10/01/19      PT LONG TERM GOAL #3   Title  able to walk to the bathroom without leaking due to improve control and coordination of the pelvic floor muscles    Time  8    Period  Weeks    Status  New    Target Date  10/01/19      PT LONG TERM GOAL #4   Title  core strength and hip strength improved so she is able to move in bed with greater ease    Time  8    Period  Weeks    Status  New    Target Date  10/01/19      PT LONG TERM GOAL #5   Title  understand ways to manage her prolapse    Time  8    Period  Weeks    Status  New    Target Date  10/01/19            Plan - 08/20/19 6834    Clinical Impression Statement  Patient was educated on laying on her back with hips elevated to reduce the prolapse. Patient has the Estradiol and has not used it yet. Patient understands how to use it and coconut oil on the vulva area. Patient needed verbal cues to perform diaphragmatic breathing to relax the pelvic floor for toileting. Patient  has learned correct toileting. Patient is learning how to contract the pelvic floor with core strength. Patient will benefit from skilled therapy to improve pelvic floor coordination and strength while educating on vaginal health.    Personal Factors and Comorbidities  Age;Fitness;Comorbidity 3+;Time since onset of injury/illness/exacerbation    Comorbidities  Multiple Myeloma; Bladder suspension 2x; Right total shoulder arthroplasty; Cervical  stenosis;    Examination-Activity Limitations  Continence;Toileting;Bed Mobility    Examination-Participation Restrictions  Community Activity    Stability/Clinical Decision Making  Evolving/Moderate complexity    Rehab Potential  Good    PT Frequency  1x / week    PT Duration  8 weeks    PT Treatment/Interventions  Biofeedback;Electrical Stimulation;Therapeutic activities;Therapeutic exercise;Neuromuscular re-education;Manual techniques;Patient/family education;Passive range of motion    PT Next Visit Plan  internal soft tissue work, hip stretches, hip strength, core strength; supine clam with yellow band, abdominal bracind    Recommended Other Services  MD signed initial eval    Consulted and Agree with Plan of Care  Patient       Patient will benefit from skilled therapeutic intervention in order to improve the following deficits and impairments:  Decreased coordination, Decreased range of motion, Increased fascial restricitons, Decreased endurance, Decreased activity tolerance, Decreased strength, Decreased mobility  Visit Diagnosis: Muscle weakness (generalized)  Unspecified lack of coordination  Stress incontinence (female) (female)     Problem List Patient Active Problem List   Diagnosis Date Noted  . Skin lesion of back 08/07/2019  . Pelvic floor dysfunction in female 07/09/2019  . Alopecia 07/09/2019  . Squamous cell cancer of skin of left temple 06/04/2019  . Exertional shortness of breath 02/14/2019  . Vitamin B12 deficiency without anemia 09/25/2018  . Large hiatal hernia 07/26/2018  . Belching 07/26/2018  . Early satiety 07/26/2018  . Lung nodule seen on imaging study 06/25/2018  . 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 Tinnitus of both ears 04/10/2018  . Preventive measure 04/10/2018  . Goals of care, counseling/discussion 02/20/2018  . Pain of left scapula 12/06/2017  . Vitamin D deficiency 11/12/2017  . Yeast infection of the skin  09/06/2017  . Family hx of colon cancer requiring screening colonoscopy 01/17/2017  . Cystocele and rectocele with incomplete uterovaginal prolapse 11/22/2016  . 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 Bilateral pleural effusion 01/25/2016  . Pericardial effusion 01/24/2016  . Physical deconditioning 01/11/2016  . S/P autologous bone marrow transplantation ([place]) 11/08/2015  . Therapeutic drug monitoring 11/08/2015  . Red blood cell antibody positive, compatible PRBC difficult to obtain 07/30/2015  . 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 Fatigue 01/26/2015  . Essential hypertension 01/26/2015  . Other fatigue 01/26/2015  . 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 Chronic recurrent sinusitis 10/23/2014  . Conjunctivitis 10/23/2014  . Status post allogeneic bone marrow transplant ([place]) 08/14/2014  . Leukopenia due to antineoplastic chemotherapy ([place]) 08/10/2014  . Patient in clinical research study 07/21/2014  . Need for prophylactic vaccination and inoculation against influenza 02/24/2014  . Leukocytosis, unspecified 02/03/2014  . Constipation 11/21/2013  . Rash 11/21/2013  . Back pain 11/21/2013  . Anxiety 11/21/2013  . Multiple myeloma ([place]) 11/07/2013  . Thrombocytopenia ([place]) 10/29/2013  . Persistent cough for 3 weeks or longer 10/02/2013  . Abnormal weight loss 10/02/2013  . Normocytic anemia 08/22/2013  . Abdominal bloating 08/22/2013  . Hypersensitivity 08/07/2013  . Other malaise and fatigue 08/07/2013  . Osteopenia 08/05/2013  . GERD (gastroesophageal reflux disease) 01/16/2013  . 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  Carotid stenosis   . Cervical disc disease   . Hypertension 04/15/2012  . Palpitations 03/29/2012    Earlie Counts, PT 08/20/19 10:15 AM   [facility] [address], [place], [place], [address] Phone: [phone]   Fax:  [phone]  Name:  MONTIE GELARDI MRN: [id number] Date of Birth: 13-Mar-1946

## 2019-08-20 NOTE — Patient Instructions (Addendum)
Toileting Techniques for Bowel Movements (Defecation) Using your belly (abdomen) and pelvic floor muscles to have a bowel movement is usually instinctive.  Sometimes people can have problems with these muscles and have to relearn proper defecation (emptying) techniques.  If you have weakness in your muscles, organs that are falling out, decreased sensation in your pelvis, or ignore your urge to go, you may find yourself straining to have a bowel movement.  You are straining if you are: . holding your breath or taking in a huge gulp of air and holding it  . keeping your lips and jaw tensed and closed tightly . turning red in the face because of excessive pushing or forcing . developing or worsening your  hemorrhoids . getting faint while pushing . not emptying completely and have to defecate many times a day  If you are straining, you are actually making it harder for yourself to have a bowel movement.  Many people find they are pulling up with the pelvic floor muscles and closing off instead of opening the anus. Due to lack pelvic floor relaxation and coordination the abdominal muscles, one has to work harder to push the feces out.  Many people have never been taught how to defecate efficiently and effectively.  Notice what happens to your body when you are having a bowel movement.  While you are sitting on the toilet pay attention to the following areas: . Jaw and mouth position . Angle of your hips   . Whether your feet touch the ground or not . Arm placement  . Spine position . Waist . Belly tension . Anus (opening of the anal canal)  An Evacuation/Defecation Plan   Here are the 4 basic points:  1. Lean forward enough for your elbows to rest on your knees 2. Support your feet on the floor or use a low stool if your feet don't touch the floor  3. Push out your belly as if you have swallowed a beach ball-you should feel a widening of your waist 4. Open and relax your pelvic floor muscles,  rather than tightening around the anus       The following conditions my require modifications to your toileting posture:  . If you have had surgery in the past that limits your back, hip, pelvic, knee or ankle flexibility . Constipation   Your healthcare practitioner may make the following additional suggestions and adjustments:  1) Sit on the toilet  a) Make sure your feet are supported. b) Notice your hip angle and spine position-most people find it effective to lean forward or raise their knees, which can help the muscles around the anus to relax  c) When you lean forward, place your forearms on your thighs for support  2) Relax suggestions a) Breath deeply in through your nose and out slowly through your mouth as if you are smelling the flowers and blowing out the candles. b) To become aware of how to relax your muscles, contracting and releasing muscles can be helpful.  Pull your pelvic floor muscles in tightly by using the image of holding back gas, or closing around the anus (visualize making a circle smaller) and lifting the anus up and in.  Then release the muscles and your anus should drop down and feel open. Repeat 5 times ending with the feeling of relaxation. c) Keep your pelvic floor muscles relaxed; let your belly bulge out. d) The digestive tract starts at the mouth and ends at the anal opening, so   be sure to relax both ends of the tube.  Place your tongue on the roof of your mouth with your teeth separated.  This helps relax your mouth and will help to relax the anus at the same time.  3) Empty (defecation) a) Keep your pelvic floor and sphincter relaxed, then bulge your anal muscles.  Make the anal opening wide.  b) Stick your belly out as if you have swallowed a beach ball. c) Make your belly wall hard using your belly muscles while continuing to breathe. Doing this makes it easier to open your anus. d) Breath out and give a grunt (or try using other sounds such as  ahhhh, shhhhh, ohhhh or grrrrrrr).  4) Finish a) As you finish your bowel movement, pull the pelvic floor muscles up and in.  This will leave your anus in the proper place rather than remaining pushed out and down. If you leave your anus pushed out and down, it will start to feel as though that is normal and give you incorrect signals about needing to have a bowel movement. Adduction: Hip - Knees Together With Pelvic Floor (Hook-Lying)    Lie with hips and knees bent, towel roll between knees. Squeeze pelvic floor while pushing knees together. Hold for _5__ seconds. Rest for __5_ seconds. Repeat _10__ times. Do _2__ times a day.   Copyright  VHI. All rights reserved.    Bracing With Bridging (Hook-Lying)    With neutral spine, tighten pelvic floor and abdominals and hold. Lift bottom. Repeat _10__ times. Do __2_ times a day.  Ball between knees with exercise Copyright  VHI. All rights reserved.   Lay down with your hips on a pillow for 15 minutes for the prolapse to reduce in the middle and end of day.  [facility] [address] [place], [place] [address] Phone # [phone] Fax [phone]

## 2019-08-22 ENCOUNTER — Encounter: Payer: Self-pay | Admitting: Hematology and Oncology

## 2019-08-25 ENCOUNTER — Other Ambulatory Visit: Payer: Self-pay | Admitting: Hematology and Oncology

## 2019-08-25 ENCOUNTER — Telehealth: Payer: Self-pay | Admitting: Hematology and Oncology

## 2019-08-25 NOTE — Telephone Encounter (Signed)
Scheduled appts per 3/1 sch msg. Pt is aware of appt date and time.

## 2019-08-27 ENCOUNTER — Ambulatory Visit: Payer: Medicare HMO | Attending: Hematology and Oncology | Admitting: Physical Therapy

## 2019-08-27 ENCOUNTER — Other Ambulatory Visit: Payer: Self-pay

## 2019-08-27 ENCOUNTER — Encounter: Payer: Self-pay | Admitting: Physical Therapy

## 2019-08-27 DIAGNOSIS — N393 Stress incontinence (female) (male): Secondary | ICD-10-CM | POA: Insufficient documentation

## 2019-08-27 DIAGNOSIS — M6281 Muscle weakness (generalized): Secondary | ICD-10-CM | POA: Diagnosis present

## 2019-08-27 DIAGNOSIS — R279 Unspecified lack of coordination: Secondary | ICD-10-CM | POA: Diagnosis present

## 2019-08-27 NOTE — Therapy (Signed)
[facility] [address], [place], [place], [address] Phone: [phone]   Fax:  [phone]  Physical Therapy Treatment  Patient Details  Name: Jean Davidson MRN: [id number] Date of Birth: Feb 09, 1946 Referring Provider (PT): Dr. [name]   Encounter Date: 08/27/2019  PT End of Session - 08/27/19 0943    Visit Number  3    Date for PT Re-Evaluation  10/01/19    Authorization Type  Aetna    PT Start Time  [phone]   came late   PT Stop Time  1010    PT Time Calculation (min)  33 min    Activity Tolerance  Patient tolerated treatment well    Behavior During Therapy  [facility] for tasks assessed/performed       Past Medical History:  Diagnosis Date  . Anemia   . Anxiety   . Blood transfusion without reported diagnosis   . Carotid stenosis    Mild bilateral  . Carpal tunnel syndrome   . Cervical disc disease    c7  . Cough 07/27/2015  . Disc degeneration   . Dysuria 07/26/2015  . 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 Fatigue 01/26/2015  . Fever 07/27/2015  . GERD (gastroesophageal reflux disease)   . 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 Hypertension    Borderline.  . Internal and external hemorrhoids without complication   . 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 Multiple myeloma ([place])   . Pinched nerve   . Sinusitis, bacterial 07/27/2015  . Spinal stenosis in cervical region   . Varicose veins of lower extremities with inflammation     Past Surgical History:  Procedure Laterality Date  . BLADDER SUSPENSION  1988/1989   x2  . COLONOSCOPY    . ESOPHAGOGASTRODUODENOSCOPY    . IR IMAGING GUIDED PORT INSERTION  03/01/2018  . PORT-A-CATH REMOVAL    . SHOULDER SURGERY Right 04/06/13   right    There were no vitals filed for this visit.  Subjective Assessment - 08/27/19 0938    Subjective  I tried the toileting technique and helps with  urination. Helps her get a steadier flow. when stand up there is urinary leakage. I am drinking water now. I thinking one more session. Estradiol 1 time per week and not putting cocnut oil on the vulva area.    Patient Stated Goals  improved of control of urine and bowel    Currently in Pain?  No/denies                       [facility] Adult PT Treatment/Exercise - 08/27/19 0001      Therapeutic Activites    Therapeutic Activities  ADL's    ADL's  breathing out when laying on side and getting up from laying on side, sit to stand and back with breathing out,  breathing out as she gets in and out of car, breathing and walking to decrease strain on the bladder      Lumbar Exercises: Seated   Other Seated Lumbar Exercises  sit and contraction for 5 seconds 10x      Lumbar Exercises: Sidelying   Clam  Right;10 reps;1 second    Clam Limitations  with pelvic floor contraction    Other Sidelying Lumbar Exercises  ball squeeze sidely with pelvic floor contraction and abdominal bracing  PT Education - 08/27/19 1008    Education Details  pelvic floor contration, how to breath during daily tasks    Person(s) Educated  Patient    Methods  Explanation;Demonstration;Verbal cues;Handout    Comprehension  Returned demonstration;Verbalized understanding       PT Short Term Goals - 08/27/19 0940      PT SHORT TERM GOAL #1   Title  independent with intial HEP    Time  4    Period  Weeks    Status  Achieved      PT SHORT TERM GOAL #2   Title  understand what moisturizers are for the vaginal area and why it is important to use    Time  4    Period  Weeks    Status  Achieved      PT SHORT TERM GOAL #3   Title  understand correct toileting to reduce strain on her prolaps    Time  4    Period  Weeks    Status  Achieved        PT Long Term Goals - 08/27/19 1025      PT LONG TERM GOAL #1   Title  independent with advanced HEP    Time  8    Period  Weeks     Status  On-going      PT LONG TERM GOAL #2   Title  pelvic floor strength >/= 3/5 holding for 10 seconds with improved circular contraction and lift    Time  8    Period  Weeks    Status  On-going      PT LONG TERM GOAL #3   Title  able to walk to the bathroom without leaking due to improve control and coordination of the pelvic floor muscles    Time  8    Period  Weeks    Status  On-going      PT LONG TERM GOAL #4   Title  core strength and hip strength improved so she is able to move in bed with greater ease    Time  8    Period  Weeks    Status  On-going      PT LONG TERM GOAL #5   Title  understand ways to manage her prolapse    Time  8    Period  Weeks    Status  On-going            Plan - 08/27/19 1011    Clinical Impression Statement  Patient has trouble breathing during daily tasks increasing pressure on the prolpase. Patient is using the Estradiol 1 time per week. Patient does not lay on her back so the exercises that has her on her back she would not do. Patient understands to breath and walk to decrease strain on bladder. Patient reports the toileting technique is helping her empty her bladder and stool. Patient will benefit from skilled therapy to improve pelvic floor coordination and strength while educating on vaginal health.    Personal Factors and Comorbidities  Age;Fitness;Comorbidity 3+;Time since onset of injury/illness/exacerbation    Comorbidities  Multiple Myeloma; Bladder suspension 2x; Right total shoulder arthroplasty; Cervical stenosis;    Examination-Activity Limitations  Continence;Toileting;Bed Mobility    Examination-Participation Restrictions  Community Activity    Stability/Clinical Decision Making  Evolving/Moderate complexity    Rehab Potential  Good    PT Frequency  1x / week    PT Duration  8 weeks  PT Treatment/Interventions  Biofeedback;Electrical Stimulation;Therapeutic activities;Therapeutic exercise;Neuromuscular  re-education;Manual techniques;Patient/family education;Passive range of motion    PT Next Visit Plan  posture education, nustep, pelvic floor contraction 10 sec    Consulted and Agree with Plan of Care  Patient       Patient will benefit from skilled therapeutic intervention in order to improve the following deficits and impairments:  Decreased coordination, Decreased range of motion, Increased fascial restricitons, Decreased endurance, Decreased activity tolerance, Decreased strength, Decreased mobility  Visit Diagnosis: Muscle weakness (generalized)  Unspecified lack of coordination  Stress incontinence (female) (female)     Problem List Patient Active Problem List   Diagnosis Date Noted  . Skin lesion of back 08/07/2019  . Pelvic floor dysfunction in female 07/09/2019  . Alopecia 07/09/2019  . Squamous cell cancer of skin of left temple 06/04/2019  . Exertional shortness of breath 02/14/2019  . Vitamin B12 deficiency without anemia 09/25/2018  . Large hiatal hernia 07/26/2018  . Belching 07/26/2018  . Early satiety 07/26/2018  . Lung nodule seen on imaging study 06/25/2018  . Peripheral neuropathy due to chemotherapy ([place]) 06/11/2018  . Tinnitus of both ears 04/10/2018  . Preventive measure 04/10/2018  . Goals of care, counseling/discussion 02/20/2018  . Pain of left scapula 12/06/2017  . Vitamin D deficiency 11/12/2017  . Yeast infection of the skin 09/06/2017  . Family hx of colon cancer requiring screening colonoscopy 01/17/2017  . Cystocele and rectocele with incomplete uterovaginal prolapse 11/22/2016  . 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 Bilateral pleural effusion 01/25/2016  . Pericardial effusion 01/24/2016  . Physical deconditioning 01/11/2016  . 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 Therapeutic drug monitoring 11/08/2015  . Red blood cell antibody positive, compatible PRBC difficult to obtain 07/30/2015  . 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 Fatigue 01/26/2015  . Essential hypertension 01/26/2015  . Other fatigue 01/26/2015  . 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 Chronic recurrent sinusitis 10/23/2014  . Conjunctivitis 10/23/2014  . 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 Leukopenia due to antineoplastic chemotherapy ([place]) 08/10/2014  . Patient in clinical research study 07/21/2014  . Need for prophylactic vaccination and inoculation against influenza 02/24/2014  . Leukocytosis, unspecified 02/03/2014  . Constipation 11/21/2013  . Rash 11/21/2013  . Back pain 11/21/2013  . Anxiety 11/21/2013  . Multiple myeloma ([place]) 11/07/2013  . Thrombocytopenia ([place]) 10/29/2013  . Persistent cough for 3 weeks or longer 10/02/2013  . Abnormal weight loss 10/02/2013  . Normocytic anemia 08/22/2013  . Abdominal bloating 08/22/2013  . Hypersensitivity 08/07/2013  . Other malaise and fatigue 08/07/2013  . Osteopenia 08/05/2013  . GERD (gastroesophageal reflux disease) 01/16/2013  . 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 Carotid stenosis   . Cervical disc disease   . Hypertension 04/15/2012  . Palpitations 03/29/2012    Earlie Counts, PT 08/27/19 10:17 AM   [facility] [address] [place], [place], [address] Phone: [phone]   Fax:  [phone]  Name: Jean Davidson MRN: [id number] Date of Birth: 1946/02/04

## 2019-08-27 NOTE — Patient Instructions (Addendum)
Adduction: Hip - Knees Together With Pelvic Floor (Side-Lying)    Lie on left side, hips and knees slightly bent, towel roll between knees. Squeeze pelvic floor while pushing knees together. Hold for __5_ seconds. Rest for _5__ seconds. Repeat 10___ times. Do __2_ times a day.   Copyright  VHI. All rights reserved.  External Rotation: Hip - Knees Apart With Pelvic Floor (Side-Lying)    Lie on left side with hips and knees slightly bent, band tied just above knees. Squeeze pelvic floor while lifting top knee. Hold for _1__ seconds. Rest for _1__ seconds. Repeat _10__ times. Do _2__ times a day.   Copyright  VHI. All rights reserved.  Getting Into and Out of Bed    Sit on edge of bed, feet on floor. Lie down sideways. Roll over onto back. Keep back straight. Use hand and elbow to lower and raise trunk. Move shoulders and hips at same rate to avoid twisting the back. To get out of bed, reverse movement.   Copyright  VHI. All rights reserved.  Slow Contraction: Gravity Resisted (Sitting)    Sitting, slowly squeeze pelvic floor for _5__ seconds. Rest for __5_ seconds. Repeat __5_ times. Do ___3 times a day.  Copyright  VHI. All rights reserved.  [place] [facility] [address] [place], [place] [address] Phone # [phone] Fax [phone]

## 2019-08-29 ENCOUNTER — Encounter: Payer: Self-pay | Admitting: Hematology and Oncology

## 2019-09-02 ENCOUNTER — Ambulatory Visit (INDEPENDENT_AMBULATORY_CARE_PROVIDER_SITE_OTHER): Payer: Medicare HMO | Admitting: Vascular Surgery

## 2019-09-02 ENCOUNTER — Encounter (INDEPENDENT_AMBULATORY_CARE_PROVIDER_SITE_OTHER): Payer: Medicare HMO

## 2019-09-03 ENCOUNTER — Ambulatory Visit: Payer: Medicare HMO | Admitting: Physical Therapy

## 2019-09-03 ENCOUNTER — Other Ambulatory Visit (INDEPENDENT_AMBULATORY_CARE_PROVIDER_SITE_OTHER): Payer: Self-pay | Admitting: Vascular Surgery

## 2019-09-03 ENCOUNTER — Other Ambulatory Visit: Payer: Self-pay

## 2019-09-03 ENCOUNTER — Encounter: Payer: Self-pay | Admitting: Physical Therapy

## 2019-09-03 DIAGNOSIS — N393 Stress incontinence (female) (male): Secondary | ICD-10-CM

## 2019-09-03 DIAGNOSIS — M6281 Muscle weakness (generalized): Secondary | ICD-10-CM | POA: Diagnosis not present

## 2019-09-03 DIAGNOSIS — I779 Disorder of arteries and arterioles, unspecified: Secondary | ICD-10-CM

## 2019-09-03 DIAGNOSIS — R279 Unspecified lack of coordination: Secondary | ICD-10-CM

## 2019-09-03 NOTE — Therapy (Addendum)
[facility] [address], [place], [place], [address] Phone: [phone]   Fax:  [phone]  Physical Therapy Treatment  Patient Details  Name: Jean Davidson MRN: [id number] Date of Birth: [birth year] Referring Provider (PT): Dr. [name]   Encounter Date: 09/03/2019  PT End of Session - 09/03/19 1012    Visit Number  4    Date for PT Re-Evaluation  10/01/19    Authorization Type  Aetna    PT Start Time  0930    PT Stop Time  1008    PT Time Calculation (min)  38 min    Activity Tolerance  Patient tolerated treatment well    Behavior During Therapy  [facility] for tasks assessed/performed       Past Medical History:  Diagnosis Date  . Anemia   . Anxiety   . Blood transfusion without reported diagnosis   . Carotid stenosis    Mild bilateral  . Carpal tunnel syndrome   . Cervical disc disease    c7  . Cough 07/27/2015  . Disc degeneration   . Dysuria 07/26/2015  . 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 Fatigue 01/26/2015  . Fever 07/27/2015  . GERD (gastroesophageal reflux disease)   . 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 Hypertension    Borderline.  . Internal and external hemorrhoids without complication   . 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 Multiple myeloma ([place])   . Pinched nerve   . Sinusitis, bacterial 07/27/2015  . Spinal stenosis in cervical region   . Varicose veins of lower extremities with inflammation     Past Surgical History:  Procedure Laterality Date  . BLADDER SUSPENSION  1988/1989   x2  . COLONOSCOPY    . ESOPHAGOGASTRODUODENOSCOPY    . IR IMAGING GUIDED PORT INSERTION  03/01/2018  . PORT-A-CATH REMOVAL    . SHOULDER SURGERY Right 04/06/13   right    There were no vitals filed for this visit.  Subjective Assessment - 09/03/19 0933    Subjective  I get my second shot today.    Patient Stated Goals  improved  of control of urine and bowel    Currently in Pain?  No/denies    Multiple Pain Sites  No         [facility] PT Assessment - 09/03/19 0001      Assessment   Medical Diagnosis  M62.89 Pelvic floor Dysfunction in female    Referring Provider (PT)  Dr. [name]    Onset Date/Surgical Date  --   chronic   Prior Therapy  none      Precautions   Precautions  Other (comment)    Precaution Comments  Multiple myeloma      Restrictions   Weight Bearing Restrictions  No      [place] residence      Prior Function   Level of [place]  Retired    Leisure  prior to [employer] 3 exercises classes per week      Cognition   Overall Cognitive Status  Within Functional Limits for tasks assessed      Observation/Other Assessments   Focus on Therapeutic Outcomes (FOTO)   PFIQ-7 8%      Posture/Postural Control   Posture/Postural Control  No significant limitations  AROM   Lumbar Extension  decreased by 50%    Lumbar - Right Side Bend  decreased by 25%    Lumbar - Left Side Bend  decreased by 25%      Strength   Right Hip Flexion  4+/5    Right Hip Extension  4+/5    Right Hip ABduction  4/5    Left Hip Flexion  4+/5    Left Hip Extension  4+/5    Left Hip ABduction  4/5                Pelvic Floor Special Questions - 09/03/19 0001    Exam Type  Deferred    Strength  fair squeeze, definite lift        [facility] Adult PT Treatment/Exercise - 09/03/19 0001      Neuro Re-ed    Neuro Re-ed Details   sitting contract pelvic floor 10 sec 10x with VC to breath; single leg stance with pelvic floor contraction 15 sec each; tandem stance with pelvic floor contraction holding for 30 seconds with pelvic floor contraction      Lumbar Exercises: Stretches   Passive Hamstring Stretch  Right;Left;1 rep;30 seconds    Passive Hamstring Stretch Limitations  with strap    Single Knee to Chest Stretch  Right;Left;1 rep;30  seconds    Lower Trunk Rotation  2 reps;20 seconds    Lower Trunk Rotation Limitations  supine    Piriformis Stretch  Right;Left;1 rep;30 seconds    Piriformis Stretch Limitations  supine    Other Lumbar Stretch Exercise  cervical stretches      Lumbar Exercises: Aerobic   Stationary Bike  level 1 for for 5 minutes while assessing patient             PT Education - 09/03/19 1011    Education Details  Access Code: [email or web address]    Person(s) Educated  Patient    Methods  Explanation;Demonstration;Verbal cues;Handout    Comprehension  Returned demonstration;Verbalized understanding       PT Short Term Goals - 08/27/19 0940      PT SHORT TERM GOAL #1   Title  independent with intial HEP    Time  4    Period  Weeks    Status  Achieved      PT SHORT TERM GOAL #2   Title  understand what moisturizers are for the vaginal area and why it is important to use    Time  4    Period  Weeks    Status  Achieved      PT SHORT TERM GOAL #3   Title  understand correct toileting to reduce strain on her prolaps    Time  4    Period  Weeks    Status  Achieved        PT Long Term Goals - 09/03/19 0941      PT LONG TERM GOAL #1   Title  independent with advanced HEP    Time  8    Period  Weeks    Status  Achieved      PT LONG TERM GOAL #2   Title  pelvic floor strength >/= 3/5 holding for 10 seconds with improved circular contraction and lift    Time  8    Period  Weeks    Status  Achieved      PT LONG TERM GOAL #3   Title  able to walk to the bathroom without leaking  due to improve control and coordination of the pelvic floor muscles    Time  8    Period  Weeks    Status  Achieved      PT LONG TERM GOAL #4   Title  core strength and hip strength improved so she is able to move in bed with greater ease    Time  8    Period  Weeks    Status  Achieved      PT LONG TERM GOAL #5   Title  understand ways to manage her prolapse    Time  8    Period  Weeks    Status   Achieved            Plan - 09/03/19 0944    Clinical Impression Statement  Patient is able to walk to the bathroom without urinary leakage. Patient understands ways to manage her prolapse. Patient has a comprehensive exercise program with stretches, pelvic floor contractions and balance exercises with pelvic floor contraction. Patient has increased pelvic floor and hip strength. Patient understands how to toilet to empty bladder and bowels. Patient ready for discharge.    Personal Factors and Comorbidities  Age;Fitness;Comorbidity 3+;Time since onset of injury/illness/exacerbation    Comorbidities  Multiple Myeloma; Bladder suspension 2x; Right total shoulder arthroplasty; Cervical stenosis;    Examination-Activity Limitations  Continence;Toileting;Bed Mobility    Examination-Participation Restrictions  Community Activity    Stability/Clinical Decision Making  Evolving/Moderate complexity    Rehab Potential  Good    PT Frequency  1x / week    PT Duration  8 weeks    PT Treatment/Interventions  Biofeedback;Electrical Stimulation;Therapeutic activities;Therapeutic exercise;Neuromuscular re-education;Manual techniques;Patient/family education;Passive range of motion    PT Next Visit Plan  discharge to HEP    PT Home Exercise Plan  Access Code: [email or web address]    Consulted and Agree with Plan of Care  Patient       Patient will benefit from skilled therapeutic intervention in order to improve the following deficits and impairments:  Decreased coordination, Decreased range of motion, Increased fascial restricitons, Decreased endurance, Decreased activity tolerance, Decreased strength, Decreased mobility  Visit Diagnosis: Muscle weakness (generalized)  Unspecified lack of coordination  Stress incontinence (female) (female)     Problem List Patient Active Problem List   Diagnosis Date Noted  . Skin lesion of back 08/07/2019  . Pelvic floor dysfunction in female 07/09/2019  . Alopecia  07/09/2019  . Squamous cell cancer of skin of left temple 06/04/2019  . Exertional shortness of breath 02/14/2019  . Vitamin B12 deficiency without anemia 09/25/2018  . Large hiatal hernia 07/26/2018  . Belching 07/26/2018  . Early satiety 07/26/2018  . Lung nodule seen on imaging study 06/25/2018  . Peripheral neuropathy due to chemotherapy ([place]) 06/11/2018  . Tinnitus of both ears 04/10/2018  . Preventive measure 04/10/2018  . Goals of care, counseling/discussion 02/20/2018  . Pain of left scapula 12/06/2017  . Vitamin D deficiency 11/12/2017  . Yeast infection of the skin 09/06/2017  . Family hx of colon cancer requiring screening colonoscopy 01/17/2017  . Cystocele and rectocele with incomplete uterovaginal prolapse 11/22/2016  . 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 Bilateral pleural effusion 01/25/2016  . Pericardial effusion 01/24/2016  . Physical deconditioning 01/11/2016  . 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 Therapeutic drug monitoring 11/08/2015  . Red blood cell antibody positive, compatible PRBC difficult to obtain 07/30/2015  . 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 Fatigue 01/26/2015  .  Essential hypertension 01/26/2015  . Other fatigue 01/26/2015  . 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 Chronic recurrent sinusitis 10/23/2014  . Conjunctivitis 10/23/2014  . 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 Leukopenia due to antineoplastic chemotherapy ([place]) 08/10/2014  . Patient in clinical research study 07/21/2014  . Need for prophylactic vaccination and inoculation against influenza 02/24/2014  . Leukocytosis, unspecified 02/03/2014  . Constipation 11/21/2013  . Rash 11/21/2013  . Back pain 11/21/2013  . Anxiety 11/21/2013  . Multiple myeloma ([place]) 11/07/2013  . Thrombocytopenia ([place]) 10/29/2013  . Persistent cough for 3 weeks or longer 10/02/2013  . Abnormal  weight loss 10/02/2013  . Normocytic anemia 08/22/2013  . Abdominal bloating 08/22/2013  . Hypersensitivity 08/07/2013  . Other malaise and fatigue 08/07/2013  . Osteopenia 08/05/2013  . GERD (gastroesophageal reflux disease) 01/16/2013  . 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 Carotid stenosis   . Cervical disc disease   . Hypertension 04/15/2012  . Palpitations 03/29/2012    Earlie Counts, PT 09/03/19 10:16 AM   [facility] [address] [place], [place], [address] Phone: [phone]   Fax:  [phone]  Name: Jean Davidson MRN: [id number] Date of Birth: 1946/03/01  PHYSICAL THERAPY DISCHARGE SUMMARY  Visits from Start of Care: 4  Current functional level related to goals / functional outcomes: See above. Patient did not return since her last visit on 09/03/2019. She met all of her goals on 09/03/2019.   Remaining deficits: See above.    Education / Equipment: HEP  Plan: Patient agrees to discharge.  Patient goals were met. Patient is being discharged due to meeting the stated rehab goals.  Thank you for the referral. Earlie Counts, PT 12/04/19 10:35 AM  ?????

## 2019-09-03 NOTE — Patient Instructions (Signed)
Access Code: [email or web address] URL: [email or web address] Date: 09/03/2019 Prepared by: Earlie Counts  Exercises Hooklying Single Knee to Chest - 2 reps - 1 sets - 20 sec hold - 1x daily - 7x weekly Supine Lower Trunk Rotation - 2 reps - 1 sets - 30 sec hold - 1x daily - 7x weekly Hooklying Hamstring Stretch with Strap - 2 reps - 1 sets - 30 sec hold - 1x daily - 7x weekly Supine Piriformis Stretch Pulling Heel to Hip - 2 reps - 1 sets - 30 sec hold - 1x daily - 7x weekly Seated Cervical Flexion Stretch with Finger Support Behind Neck - 1 reps - 1 sets - 15 sec hold - 1x daily - 7x weekly Seated Cervical Sidebending Stretch - 1 reps - 1 sets - 10 sec hold - 1x daily - 7x weekly Seated Passive Cervical Retraction - 1 reps - 1 sets - 10 sec hold - 1x daily - 7x weekly Neck Rotation - 1 reps - 1 sets - 5 sec hold - 1x daily - 7x weekly Seated Pelvic Floor Contraction - 10 reps - 1 sets - 10 sec hold - 2x daily - 7x weekly Standing Single Leg Stance with Counter Support - 3 reps - 1 sets - 15 sec hold - 1x daily - 7x weekly Standing Tandem Balance with Counter Support - 2 reps - 1 sets - 30 sec hold - 1x daily - 7x weekly [facility] [address] [place], [place] [address] Phone # [phone] Fax [phone]

## 2019-09-04 ENCOUNTER — Ambulatory Visit: Payer: Medicare HMO

## 2019-09-04 ENCOUNTER — Other Ambulatory Visit: Payer: Medicare HMO

## 2019-09-04 ENCOUNTER — Ambulatory Visit: Payer: Medicare HMO | Admitting: Hematology and Oncology

## 2019-09-04 ENCOUNTER — Inpatient Hospital Stay: Payer: Medicare HMO

## 2019-09-05 ENCOUNTER — Ambulatory Visit (INDEPENDENT_AMBULATORY_CARE_PROVIDER_SITE_OTHER): Payer: Medicare HMO

## 2019-09-05 ENCOUNTER — Ambulatory Visit (INDEPENDENT_AMBULATORY_CARE_PROVIDER_SITE_OTHER): Payer: Medicare HMO | Admitting: Vascular Surgery

## 2019-09-05 ENCOUNTER — Other Ambulatory Visit: Payer: Self-pay

## 2019-09-05 ENCOUNTER — Encounter (INDEPENDENT_AMBULATORY_CARE_PROVIDER_SITE_OTHER): Payer: Self-pay | Admitting: Vascular Surgery

## 2019-09-05 VITALS — BP 131/81 | HR 71 | Resp 16 | Wt 164.6 lb

## 2019-09-05 DIAGNOSIS — I779 Disorder of arteries and arterioles, unspecified: Secondary | ICD-10-CM

## 2019-09-05 DIAGNOSIS — I6523 Occlusion and stenosis of bilateral carotid arteries: Secondary | ICD-10-CM | POA: Diagnosis not present

## 2019-09-05 DIAGNOSIS — I10 Essential (primary) hypertension: Secondary | ICD-10-CM | POA: Diagnosis not present

## 2019-09-05 NOTE — Assessment & Plan Note (Signed)
Carotid duplex today reveals stable, 1 to 39% carotid artery stenosis bilaterally with very mild disease.  I think continuing to follow this on an every other year basis at this point would be reasonable.  Patient will contact our office with any problems in the interim.

## 2019-09-05 NOTE — Assessment & Plan Note (Signed)
blood pressure control important in reducing the progression of atherosclerotic disease. On appropriate oral medications.  

## 2019-09-05 NOTE — Progress Notes (Signed)
MRN : [id number]  Jean Davidson is a 74 y.o. (08-Jan-1946) female who presents with chief complaint of  Chief Complaint  Patient presents with  . Follow-up    ultrasound follow up  .  History of Present Illness: Patient returns today in follow up of her carotid disease.  She has had a rough several months with her brother having had 2 heart attacks recently.  She has not had any major medical issues in the recent past.  No focal neurologic symptoms. Specifically, the patient denies amaurosis fugax, speech or swallowing difficulties, or arm or leg weakness or numbness. Carotid duplex today reveals stable, 1 to 39% carotid artery stenosis bilaterally with very mild disease.  Current Outpatient Medications  Medication Sig Dispense Refill  . acyclovir (ZOVIRAX) 400 MG tablet TAKE 1 TABLET BY MOUTH TWICE A DAY 180 tablet 11  . aspirin 81 MG chewable tablet Chew 325 mg by mouth daily. Takes 4 tablets (325 mg daily) 21 days out of the month with chemo drug    . calcipotriene (DOVONOX) 0.005 % cream daily.     . calcium carbonate (TUMS - DOSED IN MG ELEMENTAL CALCIUM) 500 MG chewable tablet Chew 1 tablet by mouth daily.    . Cholecalciferol (VITAMIN D-1000 MAX ST) 1000 units tablet Take 1,000 Units by mouth daily.    . cyanocobalamin 1000 MCG tablet Take 1,000 mcg by mouth daily.    Marland Kitchen DARATUMUMAB IV Inject into the vein every 30 (thirty) days.    Marland Kitchen docusate sodium (COLACE) 100 MG capsule Take 100 mg by mouth daily as needed for mild constipation.     Marland Kitchen estradiol (ESTRACE VAGINAL) 0.1 MG/GM vaginal cream Place 1 Applicatorful vaginally at bedtime. 42.5 g 12  . loperamide (IMODIUM) 2 MG capsule Take by mouth as needed for diarrhea or loose stools. As needed only    . metoprolol tartrate (LOPRESSOR) 25 MG tablet Take 37.5 mg by mouth 2 (two) times daily.    . mirtazapine (REMERON) 15 MG tablet TAKE 1 TABLET BY MOUTH EVERYDAY AT BEDTIME 90 tablet 1  . Multiple Vitamin (MULTIVITAMIN WITH MINERALS)  TABS tablet Take 1 tablet by mouth daily.    . ondansetron (ZOFRAN) 8 MG tablet Take 1 tablet (8 mg total) by mouth every 8 (eight) hours as needed (Nausea or vomiting). 30 tablet 1  . pantoprazole (PROTONIX) 40 MG tablet TAKE 1 TABLET (40 MG TOTAL) BY MOUTH 2 (TWO) TIMES DAILY. TAKE 30-60 MINUTES BEFORE BREAKFAST AND DINNER 180 tablet 2  . pomalidomide (POMALYST) 2 MG capsule Take 1 capsule (2 mg total) by mouth daily. Take with water on days 1-21. Repeat every 28 days. 21 capsule 0  . prochlorperazine (COMPAZINE) 10 MG tablet Take 1 tablet (10 mg total) by mouth every 6 (six) hours as needed (Nausea or vomiting). 30 tablet 1   No current facility-administered medications for this visit.    Past Medical History:  Diagnosis Date  . Anemia   . Anxiety   . Blood transfusion without reported diagnosis   . Carotid stenosis    Mild bilateral  . Carpal tunnel syndrome   . Cervical disc disease    c7  . Cough 07/27/2015  . Disc degeneration   . Dysuria 07/26/2015  . 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 Fatigue 01/26/2015  . Fever 07/27/2015  . GERD (gastroesophageal reflux disease)   . 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 Hypertension    Borderline.  . Internal  and external hemorrhoids without complication   . 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 Multiple myeloma ([place])   . Pinched nerve   . Sinusitis, bacterial 07/27/2015  . Spinal stenosis in cervical region   . Varicose veins of lower extremities with inflammation     Past Surgical History:  Procedure Laterality Date  . BLADDER SUSPENSION  1988/1989   x2  . COLONOSCOPY    . ESOPHAGOGASTRODUODENOSCOPY    . IR IMAGING GUIDED PORT INSERTION  03/01/2018  . PORT-A-CATH REMOVAL    . SHOULDER SURGERY Right 04/06/13   right    Social History   Tobacco Use  . Smoking status: Former Smoker    Packs/day: 0.50    Years: 4.00    Pack years: 2.00    Quit date:  06/26/1968    Years since quitting: 51.2  . Smokeless tobacco: Never Used  Substance Use Topics  . Alcohol use: Yes    Alcohol/week: 1.0 standard drinks    Types: 1 Cans of beer per week    Comment: 1 per week per pt  . Drug use: No    Family History  Problem Relation Age of Onset  . Heart disease Mother   . Heart failure Father   . Heart disease Father   . Throat cancer Father   . Skin cancer Father   . Diabetes Father   . Kidney disease Father   . Hypertension Brother   . Heart disease Brother        congenital shunt, ?   . Colon cancer Paternal Aunt 60  . Skin cancer Brother   . Stomach cancer Neg Hx      No Known Allergies  REVIEW OF SYSTEMS(Negative unless checked)  Constitutional: []?Weight loss[]?Fever[]?Chills Cardiac:[]?Chest pain[]?Chest pressure[]?Palpitations []?Shortness of breath when laying flat []?Shortness of breath at rest []?Shortness of breath with exertion. Vascular: []?Pain in legs with walking[]?Pain in legsat rest[]?Pain in legs when laying flat []?Claudication []?Pain in feet when walking []?Pain in feet at rest []?Pain in feet when laying flat []?History of DVT []?Phlebitis []?Swelling in legs []?Varicose veins []?Non-healing ulcers Pulmonary: []?Uses home oxygen []?Productive cough[]?Hemoptysis []?Wheeze []?COPD []?Asthma Neurologic: []?Dizziness []?Blackouts []?Seizures []?History of stroke []?History of TIA[]?Aphasia []?Temporary blindness[]?Dysphagia []?Weaknessor numbness in arms [x]?Weakness or numbnessin legs Musculoskeletal: []?Arthritis []?Joint swelling []?Joint pain []?Low back pain Hematologic:[]?Easy bruising[]?Easy bleeding []?Hypercoagulable state []?Anemic []?Hepatitis Gastrointestinal:[]?Blood in stool[]?Vomiting blood[]?Gastroesophageal reflux/heartburn[]?Difficulty swallowing. Genitourinary: [x]?Chronic kidney disease  []?Difficulturination []?Frequenturination []?Burning with urination[]?Blood in urine Skin: []?Rashes []?Ulcers []?Wounds Psychological: []?History of anxiety[]?History of major depression.  Physical Examination  BP 131/81 (BP Location: Left Arm)   Pulse 71   Resp 16   Wt 164 lb 9.6 oz (74.7 kg)   BMI 30.11 kg/m  Gen:  WD/WN, NAD Head: [place]/AT, No temporalis wasting. Ear/Nose/Throat: Hearing grossly intact, nares w/o erythema or drainage Eyes: Conjunctiva clear. Sclera non-icteric Neck: Supple.  Trachea midline.  No bruit Pulmonary:  Good air movement, no use of accessory muscles.  Cardiac: RRR, no JVD Vascular:  Vessel Right Left  Radial Palpable Palpable                   Musculoskeletal: M/S 5/5 throughout.  No deformity or atrophy.  No edema. Neurologic: Sensation grossly intact in extremities.  Symmetrical.  Speech is fluent.  Psychiatric: Judgment intact, Mood & affect appropriate for pt's clinical situation. Dermatologic: No rashes or ulcers noted.  No cellulitis or  open wounds.       Labs Recent Results (from the past 2160 hour(s))  Multiple Myeloma Panel (SPEP&IFE w/QIG)     Status: Abnormal   Collection Time: 08/07/19  8:10 AM  Result Value Ref Range   IgG (Immunoglobin G), Serum 515 (L) 586 - 1,602 mg/dL   IgA 229 64 - 422 mg/dL   IgM (Immunoglobulin M), Srm 57 26 - 217 mg/dL   Total Protein ELP 5.6 (L) 6.0 - 8.5 g/dL   Albumin SerPl Elph-Mcnc 3.4 2.9 - 4.4 g/dL   Alpha 1 0.2 0.0 - 0.4 g/dL   Alpha2 Glob SerPl Elph-Mcnc 0.6 0.4 - 1.0 g/dL   B-Globulin SerPl Elph-Mcnc 0.8 0.7 - 1.3 g/dL   Gamma Glob SerPl Elph-Mcnc 0.5 0.4 - 1.8 g/dL   M Protein SerPl Elph-Mcnc Not Observed Not Observed g/dL   Globulin, Total 2.2 2.2 - 3.9 g/dL   Albumin/Glob SerPl 1.6 0.7 - 1.7   IFE 1 Comment     Comment: (NOTE) The immunofixation pattern appears unremarkable. Evidence of monoclonal protein is not apparent.    Please Note Comment     Comment:  (NOTE) Protein electrophoresis scan will follow via computer, mail, or courier delivery. Performed At: [facility] [place], [place] [id number] Rush Farmer MD [id number]   CBC with Differential/Platelet     Status: Abnormal   Collection Time: 08/07/19  8:12 AM  Result Value Ref Range   WBC 4.4 4.0 - 10.5 K/uL   RBC 3.70 (L) 3.87 - 5.11 MIL/uL   Hemoglobin 13.0 12.0 - 15.0 g/dL   HCT 39.4 36.0 - 46.0 %   MCV 106.5 (H) 80.0 - 100.0 fL   MCH 35.1 (H) 26.0 - 34.0 pg   MCHC 33.0 30.0 - 36.0 g/dL   RDW 13.7 11.5 - 15.5 %   Platelets 228 150 - 400 K/uL   nRBC 0.0 0.0 - 0.2 %   Neutrophils Relative % 46 %   Neutro Abs 2.1 1.7 - 7.7 K/uL   Lymphocytes Relative 42 %   Lymphs Abs 1.8 0.7 - 4.0 K/uL   Monocytes Relative 11 %   Monocytes Absolute 0.5 0.1 - 1.0 K/uL   Eosinophils Relative 0 %   Eosinophils Absolute 0.0 0.0 - 0.5 K/uL   Basophils Relative 1 %   Basophils Absolute 0.0 0.0 - 0.1 K/uL   Immature Granulocytes 0 %   Abs Immature Granulocytes 0.01 0.00 - 0.07 K/uL    Comment: Performed at [facility], [place] [address] [place], [place] [address]  Comprehensive metabolic panel     Status: Abnormal   Collection Time: 08/07/19  8:12 AM  Result Value Ref Range   Sodium 141 135 - 145 mmol/L   Potassium 4.1 3.5 - 5.1 mmol/L   Chloride 106 98 - 111 mmol/L   CO2 27 22 - 32 mmol/L   Glucose, Bld 88 70 - 99 mg/dL   BUN 13 8 - 23 mg/dL   Creatinine, Ser 0.71 0.44 - 1.00 mg/dL   Calcium 8.8 (L) 8.9 - 10.3 mg/dL   Total Protein 6.2 (L) 6.5 - 8.1 g/dL   Albumin 3.5 3.5 - 5.0 g/dL   AST 8 (L) 15 - 41 U/L   ALT 10 0 - 44 U/L   Alkaline Phosphatase 78 38 - 126 U/L   Total Bilirubin 0.3 0.3 - 1.2 mg/dL   GFR calc non Af Amer >60 >60 mL/min   GFR calc Af Amer >  60 >60 mL/min   Anion gap 8 5 - 15    Comment: Performed at [facility], [place] [address] [place], [place] [address]  Kappa/lambda light chains      Status: Abnormal   Collection Time: 08/07/19  8:12 AM  Result Value Ref Range   Kappa free light chain 22.6 (H) 3.3 - 19.4 mg/L   Lamda free light chains 24.1 5.7 - 26.3 mg/L   Kappa, lamda light chain ratio 0.94 0.26 - 1.65    Comment: (NOTE) Performed At: [facility] [place], [place] [id number] Rush Farmer MD [id number]     Radiology No results found.  Assessment/Plan  Essential hypertension blood pressure control important in reducing the progression of atherosclerotic disease. On appropriate oral medications.   Carotid stenosis Carotid duplex today reveals stable, 1 to 39% carotid artery stenosis bilaterally with very mild disease.  I think continuing to follow this on an every other year basis at this point would be reasonable.  Patient will contact our office with any problems in the interim.    Leotis Pain, MD  09/05/2019 10:40 AM    This note was created with Dragon medical transcription system.  Any errors from dictation are purely unintentional

## 2019-09-08 ENCOUNTER — Other Ambulatory Visit: Payer: Self-pay

## 2019-09-08 DIAGNOSIS — C9002 Multiple myeloma in relapse: Secondary | ICD-10-CM

## 2019-09-08 MED ORDER — POMALIDOMIDE 2 MG PO CAPS: 2.0000 mg | ORAL_CAPSULE | Freq: Every day | ORAL | 0 refills | Status: DC

## 2019-09-09 ENCOUNTER — Encounter: Payer: Self-pay | Admitting: Hematology and Oncology

## 2019-09-10 ENCOUNTER — Encounter: Payer: Medicare HMO | Admitting: Physical Therapy

## 2019-09-11 ENCOUNTER — Other Ambulatory Visit: Payer: Self-pay

## 2019-09-11 ENCOUNTER — Inpatient Hospital Stay: Payer: Medicare HMO

## 2019-09-11 ENCOUNTER — Inpatient Hospital Stay: Payer: Medicare HMO | Attending: Hematology and Oncology

## 2019-09-11 ENCOUNTER — Encounter: Payer: Self-pay | Admitting: Hematology and Oncology

## 2019-09-11 ENCOUNTER — Inpatient Hospital Stay: Payer: Medicare HMO | Admitting: Hematology and Oncology

## 2019-09-11 VITALS — BP 110/55 | HR 72 | Temp 98.4°F | Resp 18

## 2019-09-11 DIAGNOSIS — C9002 Multiple myeloma in relapse: Secondary | ICD-10-CM

## 2019-09-11 DIAGNOSIS — M8580 Other specified disorders of bone density and structure, unspecified site: Secondary | ICD-10-CM | POA: Diagnosis not present

## 2019-09-11 DIAGNOSIS — C9000 Multiple myeloma not having achieved remission: Secondary | ICD-10-CM

## 2019-09-11 DIAGNOSIS — Z79899 Other long term (current) drug therapy: Secondary | ICD-10-CM | POA: Insufficient documentation

## 2019-09-11 DIAGNOSIS — R519 Headache, unspecified: Secondary | ICD-10-CM | POA: Diagnosis not present

## 2019-09-11 DIAGNOSIS — R635 Abnormal weight gain: Secondary | ICD-10-CM | POA: Insufficient documentation

## 2019-09-11 DIAGNOSIS — Z7982 Long term (current) use of aspirin: Secondary | ICD-10-CM | POA: Insufficient documentation

## 2019-09-11 DIAGNOSIS — Z5112 Encounter for antineoplastic immunotherapy: Secondary | ICD-10-CM | POA: Insufficient documentation

## 2019-09-11 DIAGNOSIS — Z9221 Personal history of antineoplastic chemotherapy: Secondary | ICD-10-CM | POA: Insufficient documentation

## 2019-09-11 DIAGNOSIS — D469 Myelodysplastic syndrome, unspecified: Secondary | ICD-10-CM

## 2019-09-11 LAB — COMPREHENSIVE METABOLIC PANEL
ALT: 7 U/L (ref 0–44)
AST: 11 U/L — ABNORMAL LOW (ref 15–41)
Albumin: 3.5 g/dL (ref 3.5–5.0)
Alkaline Phosphatase: 71 U/L (ref 38–126)
Anion gap: 9 (ref 5–15)
BUN: 11 mg/dL (ref 8–23)
CO2: 24 mmol/L (ref 22–32)
Calcium: 8.1 mg/dL — ABNORMAL LOW (ref 8.9–10.3)
Chloride: 108 mmol/L (ref 98–111)
Creatinine, Ser: 0.7 mg/dL (ref 0.44–1.00)
GFR calc Af Amer: >60 mL/min (ref >60)
GFR calc non Af Amer: >60 mL/min (ref >60)
Glucose, Bld: 104 mg/dL — ABNORMAL HIGH (ref 70–99)
Potassium: 3.8 mmol/L (ref 3.5–5.1)
Sodium: 141 mmol/L (ref 135–145)
Total Bilirubin: 0.4 mg/dL (ref 0.3–1.2)
Total Protein: 6 g/dL — ABNORMAL LOW (ref 6.5–8.1)

## 2019-09-11 LAB — CBC WITH DIFFERENTIAL/PLATELET
Abs Immature Granulocytes: 0 10*3/uL (ref 0.00–0.07)
Basophils Absolute: 0 10*3/uL (ref 0.0–0.1)
Basophils Relative: 1 %
Eosinophils Absolute: 0 10*3/uL (ref 0.0–0.5)
Eosinophils Relative: 0 %
HCT: 39.2 % (ref 36.0–46.0)
Hemoglobin: 12.9 g/dL (ref 12.0–15.0)
Immature Granulocytes: 0 %
Lymphocytes Relative: 51 %
Lymphs Abs: 2.1 10*3/uL (ref 0.7–4.0)
MCH: 34.7 pg — ABNORMAL HIGH (ref 26.0–34.0)
MCHC: 32.9 g/dL (ref 30.0–36.0)
MCV: 105.4 fL — ABNORMAL HIGH (ref 80.0–100.0)
Monocytes Absolute: 0.5 10*3/uL (ref 0.1–1.0)
Monocytes Relative: 11 %
Neutro Abs: 1.5 10*3/uL — ABNORMAL LOW (ref 1.7–7.7)
Neutrophils Relative %: 37 %
Platelets: 194 10*3/uL (ref 150–400)
RBC: 3.72 MIL/uL — ABNORMAL LOW (ref 3.87–5.11)
RDW: 13.3 % (ref 11.5–15.5)
WBC: 4.1 10*3/uL (ref 4.0–10.5)
nRBC: 0 % (ref 0.0–0.2)

## 2019-09-11 MED ORDER — DEXAMETHASONE SODIUM PHOSPHATE 10 MG/ML IJ SOLN
INTRAMUSCULAR | Status: AC
  Filled 2019-09-11: qty 1

## 2019-09-11 MED ORDER — DIPHENHYDRAMINE HCL 25 MG PO CAPS
25.0000 mg | ORAL_CAPSULE | Freq: Once | ORAL | Status: AC
  Administered 2019-09-11: 25 mg via ORAL

## 2019-09-11 MED ORDER — PROCHLORPERAZINE MALEATE 10 MG PO TABS
ORAL_TABLET | ORAL | Status: AC
  Filled 2019-09-11: qty 1

## 2019-09-11 MED ORDER — SODIUM CHLORIDE 0.9% FLUSH
10.0000 mL | INTRAVENOUS | Status: DC | PRN
  Administered 2019-09-11: 10 mL
  Filled 2019-09-11: qty 10

## 2019-09-11 MED ORDER — DIPHENHYDRAMINE HCL 25 MG PO CAPS
ORAL_CAPSULE | ORAL | Status: AC
  Filled 2019-09-11: qty 1

## 2019-09-11 MED ORDER — SODIUM CHLORIDE 0.9% FLUSH
10.0000 mL | Freq: Once | INTRAVENOUS | Status: AC | PRN
  Administered 2019-09-11: 10 mL
  Filled 2019-09-11: qty 10

## 2019-09-11 MED ORDER — HEPARIN SOD (PORK) LOCK FLUSH 100 UNIT/ML IV SOLN
500.0000 [IU] | Freq: Once | INTRAVENOUS | Status: AC | PRN
  Administered 2019-09-11: 500 [IU]
  Filled 2019-09-11: qty 5

## 2019-09-11 MED ORDER — SODIUM CHLORIDE 0.9 % IV SOLN
Freq: Once | INTRAVENOUS | Status: AC
  Filled 2019-09-11: qty 250

## 2019-09-11 MED ORDER — ACETAMINOPHEN 325 MG PO TABS
ORAL_TABLET | ORAL | Status: AC
  Filled 2019-09-11: qty 2

## 2019-09-11 MED ORDER — SODIUM CHLORIDE 0.9 % IV SOLN
16.0000 mg/kg | Freq: Once | INTRAVENOUS | Status: AC
  Administered 2019-09-11: 1200 mg via INTRAVENOUS
  Filled 2019-09-11: qty 60

## 2019-09-11 MED ORDER — PROCHLORPERAZINE MALEATE 10 MG PO TABS
10.0000 mg | ORAL_TABLET | Freq: Once | ORAL | Status: AC
  Administered 2019-09-11: 10 mg via ORAL

## 2019-09-11 MED ORDER — ACETAMINOPHEN 325 MG PO TABS
650.0000 mg | ORAL_TABLET | Freq: Once | ORAL | Status: AC
  Administered 2019-09-11: 650 mg via ORAL

## 2019-09-11 MED ORDER — DEXAMETHASONE SODIUM PHOSPHATE 10 MG/ML IJ SOLN
10.0000 mg | Freq: Once | INTRAMUSCULAR | Status: AC
  Administered 2019-09-11: 10 mg via INTRAVENOUS

## 2019-09-11 NOTE — Progress Notes (Signed)
[place] OFFICE PROGRESS NOTE  Patient Care Team: [name], [name], MD as PCP - General (Internal Medicine) [name], MD as PCP - Cardiology (Cardiology) [name], MD as Referring Physician (Hematology and Oncology) [name], MD as Consulting Physician (Internal Medicine) [name], [name], MD as Consulting Physician (Infectious Diseases) [name], MD as Consulting Physician (Hematology and Oncology) [name], NP as Nurse Practitioner (Hematology and Oncology)  ASSESSMENT & PLAN:  Multiple myeloma [facility]) The patient appears to be in complete remission with negative MRD The patient is undecided about her future long-term plan of care She has appointment and follow-up at [facility] in early April I will see her back afterwards for final discussion about plan of care We will proceed with treatment as scheduled now She remains on acyclovir for antimicrobial prophylaxis She will continue aspirin therapy for DVT prophylaxis She will continue calcium and vitamin D along with Zometa  Headache disorder She has nonspecific headaches and neck discomfort We discussed the risk and benefits of referring her to see neurologist I also recommend her to take Tylenol as needed as part of her conservative approach  Weight gain She is concerned about weight gain especially around the waistline We discussed some of the conservative approach to help her lose weight   No orders of the defined types were placed in this encounter.   All questions were answered. The patient knows to call the clinic with any problems, questions or concerns. The total time spent in the appointment was 20 minutes encounter with patients including review of chart and various tests results, discussions about plan of care and coordination of care plan   [name], MD 09/11/2019 2:31 PM  INTERVAL HISTORY: Please see below for problem oriented  charting. She returns for her monthly daratumumab infusion She feels well except for intermittent headaches and neck discomfort She is concerned about her weight gain No infusion reaction Denies recent bone pain, infection, fever or chills  SUMMARY OF ONCOLOGIC HISTORY: Oncology History Overview Note  Multiple myeloma, Ig A Lambda, M spike 3.54 grams, Calcium 9.2, Creatinine 0.8, Beta 2 microglobulin 4.52, IgA 4840 mg/dL, lambda light chain 75.4, albumin 3.6, hemoglobin 9.7, platelet 115    Primary site: Multiple Myeloma   Staging method: AJCC 6th Edition   Clinical: Stage IIA signed by [name], MD on 11/07/2013  2:46 PM   Summary: Stage IIA      Multiple myeloma ([place])  10/31/2013 Bone Marrow Biopsy   Bone marrow biopsy confirmed multiple myeloma with 40% bone marrow involvement. Skeletal survey showed minimal lesions in her score with generalized demineralization   11/10/2013 - 02/13/2014 Chemotherapy   The patient is started on induction chemotherapy with weekly dexamethasone 40 mg by mouth as well as Velcade subcutaneous injection on days 1, 4, 8 and 11. On 11/21/2013, she was started on monthly Zometa.   12/23/2013 Adverse Reaction   The dose of Velcade was reduced due to thrombocytopenia.   01/28/2014 - 04/07/2014 Chemotherapy   Revlimid is added. Treatment was discontinued due to lack of response.   02/24/2014 - 04/07/2014 Chemotherapy   Due to worsening peripheral neuropathy, Velcade injection is changed to once a week. Revlimid was given 21 days on, 7 days off.   04/07/2014 - 04/10/2014 Chemotherapy   Revlimid was discontinued due to lack of response. Chemotherapy was changed back to Velcade injection twice a week, 2 weeks on 1 week off. Her treatment was switched to  to minimum response   04/20/2014 - 06/02/2014 Chemotherapy   chemotherapy is switched to Carfilzomib, Cytoxan and dexamethasone.   04/22/2014 Procedure   she has placement of port for chemotherapy.   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 at [facility]   10/20/2014 - 03/24/2015 Chemotherapy    she received chemotherapy with Kyprolis, Revlimid and dexamethasone   10/22/2014 Procedure   She has port placement   01/19/2015 Tumor Marker   IgA lambda M spike at 0.4 g    01/20/2015 Miscellaneous   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 No cource was found and fever resolved with IV vancomycin and meropenem   09/13/2015 Bone Marrow Biopsy   Bone marrow biopsy showed no increased blasts, 3-4 % plasma cells   03/02/2016 Bone Marrow Biopsy   Bone marrow biopsy at [facility] showed normocellular (30%) bone marrow with trilineage hematopoiesis. No significant increase in blasts. No significant increase in plasma cells.   05/12/2016 Imaging   DEXA scan at [place] showed osteopenia   10/24/2016 Imaging   Skeletal survey at [facility], no new lesions   12/07/2017 Imaging   No focal abnormality noted to suggest myeloma. Exam is stable from prior exam.   03/01/2018 Procedure   Successful 8 French right internal jugular vein power port placement with its tip at the SVC/RA junction.   03/06/2018 -   Chemotherapy   The patient had daratumumab    07/09/2018 Bone Marrow Biopsy   Bone marrow biopsy at [facility] showed residual disease at 0.004% plasma cells   07/03/2019 Bone Marrow Biopsy   A. Bone marrow, flow cytometric analysis for multiple myeloma minimal residual disease detection:   Negative. No phenotypically abnormal plasma cells at or above the limit of detection identified.     No monotypic B-cell population identified. Negative for increased blasts.   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   10/31/2015 Miscellaneous   She has engrafted     REVIEW OF SYSTEMS:   Constitutional: Denies fevers, chills or abnormal weight loss Eyes: Denies blurriness of vision Ears, nose, mouth, throat, and face: Denies mucositis or sore throat Respiratory: Denies cough, dyspnea or  wheezes Cardiovascular: Denies palpitation, chest discomfort or lower extremity swelling Gastrointestinal:  Denies nausea, heartburn or change in bowel habits Skin: Denies abnormal skin rashes Lymphatics: Denies new lymphadenopathy or easy bruising Neurological:Denies numbness, tingling or new weaknesses Behavioral/Psych: Mood is stable, no new changes  All other systems were reviewed with the patient and are negative.  I have reviewed the past medical history, past surgical history, social history and family history with the  patient and they are unchanged from previous note.  ALLERGIES:  has No Known Allergies.  MEDICATIONS:  Current Outpatient Medications  Medication Sig Dispense Refill  . acyclovir (ZOVIRAX) 400 MG tablet TAKE 1 TABLET BY MOUTH TWICE A DAY 180 tablet 11  . aspirin 81 MG chewable tablet Chew 325 mg by mouth daily. Takes 4 tablets (325 mg daily) 21 days out of the month with chemo drug    . calcipotriene (DOVONOX) 0.005 % cream daily.     . calcium carbonate (TUMS - DOSED IN MG ELEMENTAL CALCIUM) 500 MG chewable tablet Chew 1 tablet by mouth daily.    . Cholecalciferol (VITAMIN D-1000 MAX ST) 1000 units tablet Take 1,000 Units by mouth daily.    . cyanocobalamin 1000 MCG tablet Take 1,000 mcg by mouth daily.    Marland Kitchen DARATUMUMAB IV Inject into the vein every 30 (thirty) days.    Marland Kitchen docusate sodium (COLACE) 100 MG capsule Take 100 mg by mouth daily as needed for mild constipation.     Marland Kitchen estradiol (ESTRACE VAGINAL) 0.1 MG/GM vaginal cream Place 1 Applicatorful vaginally at bedtime. 42.5 g 12  . loperamide (IMODIUM) 2 MG capsule Take by mouth as needed for diarrhea or loose stools. As needed only    . metoprolol tartrate (LOPRESSOR) 25 MG tablet Take 37.5 mg by mouth 2 (two) times daily.    . mirtazapine (REMERON) 15 MG tablet TAKE 1 TABLET BY MOUTH EVERYDAY AT BEDTIME 90 tablet 1  . Multiple Vitamin (MULTIVITAMIN WITH MINERALS) TABS tablet Take 1 tablet by mouth daily.    . ondansetron (ZOFRAN) 8 MG tablet Take 1 tablet (8 mg total) by mouth every 8 (eight) hours as needed (Nausea or vomiting). 30 tablet 1  . pantoprazole (PROTONIX) 40 MG tablet TAKE 1 TABLET (40 MG TOTAL) BY MOUTH 2 (TWO) TIMES DAILY. TAKE 30-60 MINUTES BEFORE BREAKFAST AND DINNER 180 tablet 2  . pomalidomide (POMALYST) 2 MG capsule Take 1 capsule (2 mg total) by mouth daily. Take with water on days 1-21. Repeat every 28 days. 21 capsule 0  . prochlorperazine (COMPAZINE) 10 MG tablet Take 1 tablet (10 mg total) by mouth every 6 (six)  hours as needed (Nausea or vomiting). 30 tablet 1   No current facility-administered medications for this visit.   Facility-Administered Medications Ordered in Other Visits  Medication Dose Route Frequency Provider Last Rate Last Admin  . heparin lock flush 100 unit/mL  500 Units Intracatheter Once PRN Alvy Bimler, Ni, MD      . sodium chloride flush (NS) 0.9 % injection 10 mL  10 mL Intracatheter PRN Alvy Bimler, Ni, MD        PHYSICAL EXAMINATION: ECOG PERFORMANCE STATUS: 0 - Asymptomatic  Vitals:   09/11/19 1044  BP: (!) 142/59  Pulse: 63  Resp: 18  Temp: 98.9 F (37.2 C)  SpO2: 100%   Filed Weights   09/11/19 1044  Weight: 166 lb 12.8 oz (75.7 kg)    GENERAL:alert, no distress and comfortable Musculoskeletal:no cyanosis of digits and no clubbing  NEURO: alert &  oriented x 3 with fluent speech, no focal motor/sensory deficits  LABORATORY DATA:  I have reviewed the data as listed    Component Value Date/Time   NA 141 09/11/2019 0945   NA 142 01/04/2017 0902   K 3.8 09/11/2019 0945   K 3.9 01/04/2017 0902   CL 108 09/11/2019 0945   CO2 24 09/11/2019 0945   CO2 28 01/04/2017 0902   GLUCOSE 104 (H) 09/11/2019 0945   GLUCOSE 92 01/04/2017 0902   BUN 11 09/11/2019 0945   BUN 9.5 01/04/2017 0902   CREATININE 0.70 09/11/2019 0945   CREATININE 0.80 12/18/2018 0901   CREATININE 0.7 01/04/2017 0902   CALCIUM 8.1 (L) 09/11/2019 0945   CALCIUM 9.0 01/04/2017 0902   PROT 6.0 (L) 09/11/2019 0945   PROT 6.0 (L) 01/04/2017 0902   PROT 5.8 (L) 01/04/2017 0902   ALBUMIN 3.5 09/11/2019 0945   ALBUMIN 3.4 (L) 01/04/2017 0902   AST 11 (L) 09/11/2019 0945   AST 18 12/18/2018 0901   AST 21 01/04/2017 0902   ALT 7 09/11/2019 0945   ALT 14 12/18/2018 0901   ALT 16 01/04/2017 0902   ALKPHOS 71 09/11/2019 0945   ALKPHOS 101 01/04/2017 0902   BILITOT 0.4 09/11/2019 0945   BILITOT 0.3 12/18/2018 0901   BILITOT 0.56 01/04/2017 0902   GFRNONAA >60 09/11/2019 0945   GFRNONAA >60  12/18/2018 0901   GFRAA >60 09/11/2019 0945   GFRAA >60 12/18/2018 0901    No results found for: SPEP, UPEP  Lab Results  Component Value Date   WBC 4.1 09/11/2019   NEUTROABS 1.5 (L) 09/11/2019   HGB 12.9 09/11/2019   HCT 39.2 09/11/2019   MCV 105.4 (H) 09/11/2019   PLT 194 09/11/2019      Chemistry      Component Value Date/Time   NA 141 09/11/2019 0945   NA 142 01/04/2017 0902   K 3.8 09/11/2019 0945   K 3.9 01/04/2017 0902   CL 108 09/11/2019 0945   CO2 24 09/11/2019 0945   CO2 28 01/04/2017 0902   BUN 11 09/11/2019 0945   BUN 9.5 01/04/2017 0902   CREATININE 0.70 09/11/2019 0945   CREATININE 0.80 12/18/2018 0901   CREATININE 0.7 01/04/2017 0902      Component Value Date/Time   CALCIUM 8.1 (L) 09/11/2019 0945   CALCIUM 9.0 01/04/2017 0902   ALKPHOS 71 09/11/2019 0945   ALKPHOS 101 01/04/2017 0902   AST 11 (L) 09/11/2019 0945   AST 18 12/18/2018 0901   AST 21 01/04/2017 0902   ALT 7 09/11/2019 0945   ALT 14 12/18/2018 0901   ALT 16 01/04/2017 0902   BILITOT 0.4 09/11/2019 0945   BILITOT 0.3 12/18/2018 0901   BILITOT 0.56 01/04/2017 0902

## 2019-09-11 NOTE — Assessment & Plan Note (Signed)
The patient appears to be in complete remission with negative MRD The patient is undecided about her future long-term plan of care She has appointment and follow-up at [facility] in early April I will see her back afterwards for final discussion about plan of care We will proceed with treatment as scheduled now She remains on acyclovir for antimicrobial prophylaxis She will continue aspirin therapy for DVT prophylaxis She will continue calcium and vitamin D along with Zometa

## 2019-09-11 NOTE — Patient Instructions (Signed)
[facility] Discharge Instructions for Patients Receiving Chemotherapy  Today you received the following chemotherapy agents: Darzalex.  To help prevent nausea and vomiting after your treatment, we encourage you to take your nausea medication as directed.   If you develop nausea and vomiting that is not controlled by your nausea medication, call the clinic.   BELOW ARE SYMPTOMS THAT SHOULD BE REPORTED IMMEDIATELY:  *FEVER GREATER THAN 100.5 F  *CHILLS WITH OR WITHOUT FEVER  NAUSEA AND VOMITING THAT IS NOT CONTROLLED WITH YOUR NAUSEA MEDICATION  *UNUSUAL SHORTNESS OF BREATH  *UNUSUAL BRUISING OR BLEEDING  TENDERNESS IN MOUTH AND THROAT WITH OR WITHOUT PRESENCE OF ULCERS  *URINARY PROBLEMS  *BOWEL PROBLEMS  UNUSUAL RASH Items with * indicate a potential emergency and should be followed up as soon as possible.  Feel free to call the clinic should you have any questions or concerns. The clinic phone number is [phone].  Please show the [place] at check-in to the Emergency Department and triage nurse.  Zoledronic Acid injection (Hypercalcemia, Oncology) What is this medicine? ZOLEDRONIC ACID (ZOE le dron ik AS id) lowers the amount of calcium loss from bone. It is used to treat too much calcium in your blood from cancer. It is also used to prevent complications of cancer that has spread to the bone. This medicine may be used for other purposes; ask your health care provider or pharmacist if you have questions. COMMON BRAND NAME(S): Zometa What should I tell my health care provider before I take this medicine? They need to know if you have any of these conditions:  aspirin-sensitive asthma  cancer, especially if you are receiving medicines used to treat cancer  dental disease or wear dentures  infection  kidney disease  receiving corticosteroids like dexamethasone or prednisone  an unusual or allergic reaction to zoledronic acid, other  medicines, foods, dyes, or preservatives  pregnant or trying to get pregnant  breast-feeding How should I use this medicine? This medicine is for infusion into a vein. It is given by a health care professional in a hospital or clinic setting. Talk to your pediatrician regarding the use of this medicine in children. Special care may be needed. Overdosage: If you think you have taken too much of this medicine contact a poison control center or emergency room at once. NOTE: This medicine is only for you. Do not share this medicine with others. What if I miss a dose? It is important not to miss your dose. Call your doctor or health care professional if you are unable to keep an appointment. What may interact with this medicine?  certain antibiotics given by injection  NSAIDs, medicines for pain and inflammation, like ibuprofen or naproxen  some diuretics like bumetanide, furosemide  teriparatide  thalidomide This list may not describe all possible interactions. Give your health care provider a list of all the medicines, herbs, non-prescription drugs, or dietary supplements you use. Also tell them if you smoke, drink alcohol, or use illegal drugs. Some items may interact with your medicine. What should I watch for while using this medicine? Visit your doctor or health care professional for regular checkups. It may be some time before you see the benefit from this medicine. Do not stop taking your medicine unless your doctor tells you to. Your doctor may order blood tests or other tests to see how you are doing. Women should inform their doctor if they wish to become pregnant or think they might be pregnant.  There is a potential for serious side effects to an unborn child. Talk to your health care professional or pharmacist for more information. You should make sure that you get enough calcium and vitamin D while you are taking this medicine. Discuss the foods you eat and the vitamins you take  with your health care professional. Some people who take this medicine have severe bone, joint, and/or muscle pain. This medicine may also increase your risk for jaw problems or a broken thigh bone. Tell your doctor right away if you have severe pain in your jaw, bones, joints, or muscles. Tell your doctor if you have any pain that does not go away or that gets worse. Tell your dentist and dental surgeon that you are taking this medicine. You should not have major dental surgery while on this medicine. See your dentist to have a dental exam and fix any dental problems before starting this medicine. Take good care of your teeth while on this medicine. Make sure you see your dentist for regular follow-up appointments. What side effects may I notice from receiving this medicine? Side effects that you should report to your doctor or health care professional as soon as possible:  allergic reactions like skin rash, itching or hives, swelling of the face, lips, or tongue  anxiety, confusion, or depression  breathing problems  changes in vision  eye pain  feeling faint or lightheaded, falls  jaw pain, especially after dental work  mouth sores  muscle cramps, stiffness, or weakness  redness, blistering, peeling or loosening of the skin, including inside the mouth  trouble passing urine or change in the amount of urine Side effects that usually do not require medical attention (report to your doctor or health care professional if they continue or are bothersome):  bone, joint, or muscle pain  constipation  diarrhea  fever  hair loss  irritation at site where injected  loss of appetite  nausea, vomiting  stomach upset  trouble sleeping  trouble swallowing  weak or tired This list may not describe all possible side effects. Call your doctor for medical advice about side effects. You may report side effects to FDA at 1-800-FDA-1088. Where should I keep my medicine? This drug  is given in a hospital or clinic and will not be stored at home. NOTE: This sheet is a summary. It may not cover all possible information. If you have questions about this medicine, talk to your doctor, pharmacist, or health care provider.  2020 Elsevier/Gold Standard (2013-11-08 14:19:39)  

## 2019-09-11 NOTE — Assessment & Plan Note (Signed)
She has nonspecific headaches and neck discomfort We discussed the risk and benefits of referring her to see neurologist I also recommend her to take Tylenol as needed as part of her conservative approach

## 2019-09-11 NOTE — Assessment & Plan Note (Signed)
She is concerned about weight gain especially around the waistline We discussed some of the conservative approach to help her lose weight

## 2019-09-12 ENCOUNTER — Telehealth: Payer: Self-pay | Admitting: Hematology and Oncology

## 2019-09-12 LAB — KAPPA/LAMBDA LIGHT CHAINS
Kappa free light chain: 17 mg/L (ref 3.3–19.4)
Kappa, lambda light chain ratio: 0.92 (ref 0.26–1.65)
Lambda free light chains: 18.5 mg/L (ref 5.7–26.3)

## 2019-09-12 NOTE — Telephone Encounter (Signed)
Scheduled per 3/18 sch msg. Called and spoke with pt, confirmed 4/15 appt

## 2019-09-15 LAB — MULTIPLE MYELOMA PANEL, SERUM
Albumin SerPl Elph-Mcnc: 3.3 g/dL (ref 2.9–4.4)
Albumin/Glob SerPl: 1.5 (ref 0.7–1.7)
Alpha 1: 0.2 g/dL (ref 0.0–0.4)
Alpha2 Glob SerPl Elph-Mcnc: 0.8 g/dL (ref 0.4–1.0)
B-Globulin SerPl Elph-Mcnc: 0.7 g/dL (ref 0.7–1.3)
Gamma Glob SerPl Elph-Mcnc: 0.5 g/dL (ref 0.4–1.8)
Globulin, Total: 2.3 g/dL (ref 2.2–3.9)
IgA: 174 mg/dL (ref 64–422)
IgG (Immunoglobin G), Serum: 447 mg/dL — ABNORMAL LOW (ref 586–1602)
IgM (Immunoglobulin M), Srm: 47 mg/dL (ref 26–217)
Total Protein ELP: 5.6 g/dL — ABNORMAL LOW (ref 6.0–8.5)

## 2019-09-16 ENCOUNTER — Telehealth: Payer: Self-pay

## 2019-09-16 NOTE — Telephone Encounter (Signed)
Called and given below message. She verbalized understanding. 

## 2019-09-16 NOTE — Telephone Encounter (Signed)
-----   Message from [name], MD sent at 09/16/2019  7:03 AM EDT ----- Regarding: pls let her know myeloma panel is still in remission

## 2019-09-17 ENCOUNTER — Encounter: Payer: Medicare HMO | Admitting: Physical Therapy

## 2019-09-30 ENCOUNTER — Encounter: Payer: Self-pay | Admitting: Hematology and Oncology

## 2019-10-02 ENCOUNTER — Other Ambulatory Visit: Payer: Self-pay

## 2019-10-02 DIAGNOSIS — C9002 Multiple myeloma in relapse: Secondary | ICD-10-CM

## 2019-10-02 MED ORDER — POMALIDOMIDE 2 MG PO CAPS: 2.0000 mg | ORAL_CAPSULE | Freq: Every day | ORAL | 0 refills | Status: DC

## 2019-10-07 ENCOUNTER — Other Ambulatory Visit: Payer: Self-pay

## 2019-10-07 ENCOUNTER — Ambulatory Visit (INDEPENDENT_AMBULATORY_CARE_PROVIDER_SITE_OTHER): Payer: Medicare HMO | Admitting: Otolaryngology

## 2019-10-07 ENCOUNTER — Encounter (INDEPENDENT_AMBULATORY_CARE_PROVIDER_SITE_OTHER): Payer: Self-pay | Admitting: Otolaryngology

## 2019-10-07 VITALS — Temp 97.5°F

## 2019-10-07 DIAGNOSIS — R519 Headache, unspecified: Secondary | ICD-10-CM | POA: Diagnosis not present

## 2019-10-07 DIAGNOSIS — J310 Chronic rhinitis: Secondary | ICD-10-CM

## 2019-10-07 NOTE — Progress Notes (Signed)
HPI: Jean Davidson is a 74 y.o. female who returns today for evaluation of "sinus headache".  She is also has some left posterior neck pain that radiates down to her shoulder.  She describes a sharp pain in the middle of her forehead between her eyes and over the right.  She is also had occasional sores or bumps in her nose but they are doing well since using antibiotic ointment in the nose.  Denies any yellow-green discharge from her nose.Marland Kitchen  Past Medical History:  Diagnosis Date  . Anemia   . Anxiety   . Blood transfusion without reported diagnosis   . Carotid stenosis    Mild bilateral  . Carpal tunnel syndrome   . Cervical disc disease    c7  . Cough 07/27/2015  . Disc degeneration   . Dysuria 07/26/2015  . 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 Fatigue 01/26/2015  . Fever 07/27/2015  . GERD (gastroesophageal reflux disease)   . 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 Hypertension    Borderline.  . Internal and external hemorrhoids without complication   . 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 Multiple myeloma ([facility])   . Pinched nerve   . Sinusitis, bacterial 07/27/2015  . Spinal stenosis in cervical region   . Varicose veins of lower extremities with inflammation    Past Surgical History:  Procedure Laterality Date  . BLADDER SUSPENSION  1988/1989   x2  . COLONOSCOPY    . ESOPHAGOGASTRODUODENOSCOPY    . IR IMAGING GUIDED PORT INSERTION  03/01/2018  . PORT-A-CATH REMOVAL    . SHOULDER SURGERY Right 04/06/13   right   Social History   Socioeconomic History  . Marital status: Divorced    Spouse name: Not on file  . Number of children: Not on file  . Years of education: Not on file  . Highest education level: Not on file  Occupational History  . Not on file  Tobacco Use  . Smoking status: Former Smoker    Packs/day: 0.50    Years: 4.00    Pack years: 2.00    Quit date: 06/26/1968    Years since  quitting: 51.3  . Smokeless tobacco: Never Used  Substance and Sexual Activity  . Alcohol use: Yes    Alcohol/week: 1.0 standard drinks    Types: 1 Cans of beer per week    Comment: 1 per week per pt  . Drug use: No  . Sexual activity: Not on file  Other Topics Concern  . Not on file  Social History Narrative   Chauncey Reading is daughter lilliana, turner will,  full code ( reviewed 81)   Social Determinants of Health   Financial Resource Strain:   . Difficulty of Paying Living Expenses:   Food Insecurity:   . Worried About Charity fundraiser in the Last Year:   . Arboriculturist in the Last Year:   Transportation Needs:   . Film/video editor (Medical):   Marland Kitchen Lack of Transportation (Non-Medical):   Physical Activity:   . Days of Exercise per Week:   . Minutes of Exercise per Session:   Stress:   . Feeling of Stress :   Social Connections:   . Frequency of Communication with Friends and Family:   . Frequency of Social Gatherings with Friends and Family:   . Attends Religious Services:   . Active Member  of Clubs or Organizations:   . Attends Archivist Meetings:   Marland Kitchen Marital Status:    Family History  Problem Relation Age of Onset  . Heart disease Mother   . Heart failure Father   . Heart disease Father   . Throat cancer Father   . Skin cancer Father   . Diabetes Father   . Kidney disease Father   . Hypertension Brother   . Heart disease Brother        congenital shunt, ?   . Colon cancer Paternal Aunt 23  . Skin cancer Brother   . Stomach cancer Neg Hx    No Known Allergies Prior to Admission medications   Medication Sig Start Date End Date Taking? Authorizing Provider  acyclovir (ZOVIRAX) 400 MG tablet TAKE 1 TABLET BY MOUTH TWICE A DAY 06/23/19  Yes [name], MD  aspirin 81 MG chewable tablet Chew 325 mg by mouth daily. Takes 4 tablets (325 mg daily) 21 days out of the month with chemo drug   Yes [provider]  calcipotriene  (DOVONOX) 0.005 % cream daily.  05/10/18  Yes [provider]  calcium carbonate (TUMS - DOSED IN MG ELEMENTAL CALCIUM) 500 MG chewable tablet Chew 1 tablet by mouth daily.   Yes [provider]  Cholecalciferol (VITAMIN D-1000 MAX ST) 1000 units tablet Take 1,000 Units by mouth daily.   Yes [provider]  cyanocobalamin 1000 MCG tablet Take 1,000 mcg by mouth daily.   Yes [provider]  DARATUMUMAB IV Inject into the vein every 30 (thirty) days.   Yes [provider]  docusate sodium (COLACE) 100 MG capsule Take 100 mg by mouth daily as needed for mild constipation.    Yes [provider]  estradiol (ESTRACE VAGINAL) 0.1 MG/GM vaginal cream Place 1 Applicatorful vaginally at bedtime. 07/09/19  Yes Gorsuch, Ni, MD  loperamide (IMODIUM) 2 MG capsule Take by mouth as needed for diarrhea or loose stools. As needed only   Yes [provider]  metoprolol tartrate (LOPRESSOR) 25 MG tablet Take 37.5 mg by mouth 2 (two) times daily.   Yes [provider]  mirtazapine (REMERON) 15 MG tablet TAKE 1 TABLET BY MOUTH EVERYDAY AT BEDTIME 03/17/19  Yes Gorsuch, Ni, MD  Multiple Vitamin (MULTIVITAMIN WITH MINERALS) TABS tablet Take 1 tablet by mouth daily.   Yes [provider]  ondansetron (ZOFRAN) 8 MG tablet Take 1 tablet (8 mg total) by mouth every 8 (eight) hours as needed (Nausea or vomiting). 01/15/19  Yes Gorsuch, Ni, MD  pantoprazole (PROTONIX) 40 MG tablet TAKE 1 TABLET (40 MG TOTAL) BY MOUTH 2 (TWO) TIMES DAILY. TAKE 30-60 MINUTES BEFORE BREAKFAST AND DINNER 07/23/19  Yes Pyrtle, Lajuan Lines, MD  pomalidomide (POMALYST) 2 MG capsule Take 1 capsule (2 mg total) by mouth daily. Take with water on days 1-21. Repeat every 28 days. 10/02/19  Yes [name], MD  prochlorperazine (COMPAZINE) 10 MG tablet Take 1 tablet (10 mg total) by mouth every 6 (six) hours as needed (Nausea or vomiting). 02/20/18  Yes Gorsuch, Ni, MD     Positive ROS:  Otherwise negative.  All other systems have been reviewed and were otherwise negative with the exception of those mentioned in the HPI and as above.  Physical Exam: Constitutional: Alert, well-appearing, no acute distress Ears: External ears without lesions or tenderness. Ear canals are clear bilaterally with intact, clear TMs.  Nasal: External nose without lesions. Septum midline with mild rhinitis.Marland Kitchen  Clear nasal passages. Nasal endoscopy was performed in the office today and on nasal endoscopy both the middle meatus regions were clear maxillary ostia could be identified and were patent and dry without drainage.  Anterior and posterior ethmoid regions were clear with no mucopurulent discharge noted.  Sphenoid region was clear. Oral: Lips and gums without lesions. Tongue and palate mucosa without lesions. Posterior oropharynx clear. Neck: No palpable adenopathy or masses.  The area of the neck pain is posterior more on the left side in the region of the trapezius and up toward the C-spine posteriorly.  No palpable masses.  No erythema or inflammation. Respiratory: Breathing comfortably  Skin: No facial/neck lesions or rash noted.  Nasal/sinus endoscopy  Date/Time: 10/07/2019 5:26 PM Performed by: Rozetta Nunnery, MD Authorized by: Rozetta Nunnery, MD   Consent:    Consent obtained:  Verbal   Consent given by:  Patient   Risks discussed:  Pain Procedure details:    Indications: sino-nasal symptoms     Medication:  Afrin   Instrument: flexible fiberoptic nasal endoscope     Scope location: bilateral nare   Sinus:    Right middle meatus: normal     Left middle meatus: normal     Right nasopharynx: normal     Left nasopharynx: normal   Comments:     On nasal endoscopy both millimeters regions were clear.  Maxillary ostia could be identified and were patent and clear.  No drainage noted from the anterior ethmoid or nasal frontal region.    Assessment: Chronic rhinitis.   On clinical exam no evidence of active infection. Neck pain probably more musculoskeletal in nature. Headache  Plan: Recommended use of Nasacort 2 sprays each nostril at night as this should help with any sinus congestion or pressure. Concerning her neck pain would recommend further evaluation with Ortho or neurosurgery as this is most likely either musculoskeletal or related to the C-spine.   Radene Journey, MD

## 2019-10-09 ENCOUNTER — Other Ambulatory Visit: Payer: Self-pay | Admitting: Hematology and Oncology

## 2019-10-09 ENCOUNTER — Encounter: Payer: Self-pay | Admitting: Hematology and Oncology

## 2019-10-09 ENCOUNTER — Inpatient Hospital Stay: Payer: Medicare HMO | Attending: Hematology and Oncology

## 2019-10-09 ENCOUNTER — Other Ambulatory Visit: Payer: Self-pay

## 2019-10-09 ENCOUNTER — Inpatient Hospital Stay: Payer: Medicare HMO | Admitting: Hematology and Oncology

## 2019-10-09 ENCOUNTER — Inpatient Hospital Stay: Payer: Medicare HMO

## 2019-10-09 VITALS — BP 134/63 | HR 63 | Temp 98.7°F | Resp 16

## 2019-10-09 DIAGNOSIS — Z79899 Other long term (current) drug therapy: Secondary | ICD-10-CM | POA: Insufficient documentation

## 2019-10-09 DIAGNOSIS — T451X5D Adverse effect of antineoplastic and immunosuppressive drugs, subsequent encounter: Secondary | ICD-10-CM | POA: Diagnosis not present

## 2019-10-09 DIAGNOSIS — G4700 Insomnia, unspecified: Secondary | ICD-10-CM

## 2019-10-09 DIAGNOSIS — C9000 Multiple myeloma not having achieved remission: Secondary | ICD-10-CM

## 2019-10-09 DIAGNOSIS — D701 Agranulocytosis secondary to cancer chemotherapy: Secondary | ICD-10-CM

## 2019-10-09 DIAGNOSIS — Z7982 Long term (current) use of aspirin: Secondary | ICD-10-CM | POA: Insufficient documentation

## 2019-10-09 DIAGNOSIS — M8580 Other specified disorders of bone density and structure, unspecified site: Secondary | ICD-10-CM | POA: Diagnosis not present

## 2019-10-09 DIAGNOSIS — Z5112 Encounter for antineoplastic immunotherapy: Secondary | ICD-10-CM | POA: Insufficient documentation

## 2019-10-09 DIAGNOSIS — T451X5A Adverse effect of antineoplastic and immunosuppressive drugs, initial encounter: Secondary | ICD-10-CM

## 2019-10-09 DIAGNOSIS — C9002 Multiple myeloma in relapse: Secondary | ICD-10-CM | POA: Diagnosis present

## 2019-10-09 DIAGNOSIS — D469 Myelodysplastic syndrome, unspecified: Secondary | ICD-10-CM

## 2019-10-09 LAB — COMPREHENSIVE METABOLIC PANEL
ALT: 8 U/L (ref 0–44)
AST: 10 U/L — ABNORMAL LOW (ref 15–41)
Albumin: 3.5 g/dL (ref 3.5–5.0)
Alkaline Phosphatase: 70 U/L (ref 38–126)
Anion gap: 6 (ref 5–15)
BUN: 14 mg/dL (ref 8–23)
CO2: 27 mmol/L (ref 22–32)
Calcium: 9.1 mg/dL (ref 8.9–10.3)
Chloride: 106 mmol/L (ref 98–111)
Creatinine, Ser: 0.77 mg/dL (ref 0.44–1.00)
GFR calc Af Amer: >60 mL/min (ref >60)
GFR calc non Af Amer: >60 mL/min (ref >60)
Glucose, Bld: 89 mg/dL (ref 70–99)
Potassium: 4.2 mmol/L (ref 3.5–5.1)
Sodium: 139 mmol/L (ref 135–145)
Total Bilirubin: 0.3 mg/dL (ref 0.3–1.2)
Total Protein: 6 g/dL — ABNORMAL LOW (ref 6.5–8.1)

## 2019-10-09 LAB — CBC WITH DIFFERENTIAL/PLATELET
Abs Immature Granulocytes: 0 10*3/uL (ref 0.00–0.07)
Basophils Absolute: 0.1 10*3/uL (ref 0.0–0.1)
Basophils Relative: 1 %
Eosinophils Absolute: 0 10*3/uL (ref 0.0–0.5)
Eosinophils Relative: 0 %
HCT: 38.6 % (ref 36.0–46.0)
Hemoglobin: 12.8 g/dL (ref 12.0–15.0)
Immature Granulocytes: 0 %
Lymphocytes Relative: 48 %
Lymphs Abs: 2.2 10*3/uL (ref 0.7–4.0)
MCH: 34.8 pg — ABNORMAL HIGH (ref 26.0–34.0)
MCHC: 33.2 g/dL (ref 30.0–36.0)
MCV: 104.9 fL — ABNORMAL HIGH (ref 80.0–100.0)
Monocytes Absolute: 0.7 10*3/uL (ref 0.1–1.0)
Monocytes Relative: 15 %
Neutro Abs: 1.6 10*3/uL — ABNORMAL LOW (ref 1.7–7.7)
Neutrophils Relative %: 36 %
Platelets: 225 10*3/uL (ref 150–400)
RBC: 3.68 MIL/uL — ABNORMAL LOW (ref 3.87–5.11)
RDW: 13.3 % (ref 11.5–15.5)
WBC: 4.4 10*3/uL (ref 4.0–10.5)
nRBC: 0 % (ref 0.0–0.2)

## 2019-10-09 MED ORDER — SODIUM CHLORIDE 0.9 % IV SOLN
10.0000 mg | Freq: Once | INTRAVENOUS | Status: AC
  Administered 2019-10-09: 10 mg via INTRAVENOUS
  Filled 2019-10-09: qty 10

## 2019-10-09 MED ORDER — ACETAMINOPHEN 325 MG PO TABS
ORAL_TABLET | ORAL | Status: AC
  Filled 2019-10-09: qty 2

## 2019-10-09 MED ORDER — DIPHENHYDRAMINE HCL 25 MG PO CAPS
25.0000 mg | ORAL_CAPSULE | Freq: Once | ORAL | Status: AC
  Administered 2019-10-09: 25 mg via ORAL

## 2019-10-09 MED ORDER — SODIUM CHLORIDE 0.9% FLUSH
10.0000 mL | Freq: Once | INTRAVENOUS | Status: AC
  Administered 2019-10-09: 10 mL
  Filled 2019-10-09: qty 10

## 2019-10-09 MED ORDER — DEXAMETHASONE SODIUM PHOSPHATE 10 MG/ML IJ SOLN: 10.0000 mg | Freq: Once | INTRAMUSCULAR | Status: DC

## 2019-10-09 MED ORDER — SODIUM CHLORIDE 0.9% FLUSH
10.0000 mL | INTRAVENOUS | Status: DC | PRN
  Administered 2019-10-09: 10 mL
  Filled 2019-10-09: qty 10

## 2019-10-09 MED ORDER — SODIUM CHLORIDE 0.9 % IV SOLN
Freq: Once | INTRAVENOUS | Status: AC
  Filled 2019-10-09: qty 250

## 2019-10-09 MED ORDER — PROCHLORPERAZINE MALEATE 10 MG PO TABS
10.0000 mg | ORAL_TABLET | Freq: Once | ORAL | Status: AC
  Administered 2019-10-09: 10 mg via ORAL

## 2019-10-09 MED ORDER — PROCHLORPERAZINE MALEATE 10 MG PO TABS
ORAL_TABLET | ORAL | Status: AC
  Filled 2019-10-09: qty 1

## 2019-10-09 MED ORDER — ACETAMINOPHEN 325 MG PO TABS
650.0000 mg | ORAL_TABLET | Freq: Once | ORAL | Status: AC
  Administered 2019-10-09: 650 mg via ORAL

## 2019-10-09 MED ORDER — MIRTAZAPINE 15 MG PO TABS: ORAL_TABLET | ORAL | 1 refills | Status: DC

## 2019-10-09 MED ORDER — HEPARIN SOD (PORK) LOCK FLUSH 100 UNIT/ML IV SOLN
500.0000 [IU] | Freq: Once | INTRAVENOUS | Status: AC | PRN
  Administered 2019-10-09: 500 [IU]
  Filled 2019-10-09: qty 5

## 2019-10-09 MED ORDER — DIPHENHYDRAMINE HCL 25 MG PO CAPS
ORAL_CAPSULE | ORAL | Status: AC
  Filled 2019-10-09: qty 1

## 2019-10-09 MED ORDER — SODIUM CHLORIDE 0.9 % IV SOLN
16.0000 mg/kg | Freq: Once | INTRAVENOUS | Status: AC
  Administered 2019-10-09: 1200 mg via INTRAVENOUS
  Filled 2019-10-09: qty 60

## 2019-10-09 NOTE — Assessment & Plan Note (Signed)
She requested medication refill for Remeron She is actively trying to lose weight I told her that Remeron sometimes can also cause weight gain She will try to reduce the dose to half a tablet I refill her prescription

## 2019-10-09 NOTE — Progress Notes (Signed)
[facility] OFFICE PROGRESS NOTE  Patient Care Team: [name], [name], MD as PCP - General (Internal Medicine) [name], MD as PCP - Cardiology (Cardiology) [name], MD as Referring Physician (Hematology and Oncology) [name], MD as Consulting Physician (Internal Medicine) [name], [name], MD as Consulting Physician (Infectious Diseases) [name], MD as Consulting Physician (Hematology and Oncology) [name], NP as Nurse Practitioner (Hematology and Oncology)  ASSESSMENT & PLAN:  Multiple myeloma [facility]) The patient appears to be in complete remission with negative MRD I have reviewed documentation and blood work from [facility] We will proceed with treatment as scheduled now She remains on acyclovir for antimicrobial prophylaxis She will continue aspirin therapy for DVT prophylaxis She will continue calcium and vitamin D along with Zometa  Leukopenia due to antineoplastic chemotherapy ([place]) Her white count is stable but she has mild neutropenia She is not symptomatic Observe only for now  Insomnia disorder She requested medication refill for Remeron She is actively trying to lose weight I told her that Remeron sometimes can also cause weight gain She will try to reduce the dose to half a tablet I refill her prescription   No orders of the defined types were placed in this encounter.   All questions were answered. The patient knows to call the clinic with any problems, questions or concerns. The total time spent in the appointment was 20 minutes encounter with patients including review of chart and various tests results, discussions about plan of care and coordination of care plan   [name], MD 10/09/2019 10:47 AM  INTERVAL HISTORY: Please see below for problem oriented charting. She returns for further follow-up She was evaluated at [facility] recently I have reviewed records  extensively She is doing well She is actively trying to lose weight Denies recent infection, fever or chills She is still taking Remeron to help with sleep She is somewhat disappointed about the recommendation for her to continue the same treatment for now SUMMARY OF ONCOLOGIC HISTORY: Oncology History Overview Note  Multiple myeloma, Ig A Lambda, M spike 3.54 grams, Calcium 9.2, Creatinine 0.8, Beta 2 microglobulin 4.52, IgA 4840 mg/dL, lambda light chain 75.4, albumin 3.6, hemoglobin 9.7, platelet 115    Primary site: Multiple Myeloma   Staging method: AJCC 6th Edition   Clinical: Stage IIA signed by [name], MD on 11/07/2013  2:46 PM   Summary: Stage IIA      Multiple myeloma ([place])  10/31/2013 Bone Marrow Biopsy   Bone marrow biopsy confirmed multiple myeloma with 40% bone marrow involvement. Skeletal survey showed minimal lesions in her score with generalized demineralization   11/10/2013 - 02/13/2014 Chemotherapy   The patient is started on induction chemotherapy with weekly dexamethasone 40 mg by mouth as well as Velcade subcutaneous injection on days 1, 4, 8 and 11. On 11/21/2013, she was started on monthly Zometa.   12/23/2013 Adverse Reaction   The dose of Velcade was reduced due to thrombocytopenia.   01/28/2014 - 04/07/2014 Chemotherapy   Revlimid is added. Treatment was discontinued due to lack of response.   02/24/2014 - 04/07/2014 Chemotherapy   Due to worsening peripheral neuropathy, Velcade injection is changed to once a week. Revlimid was given 21 days on, 7 days off.   04/07/2014 - 04/10/2014 Chemotherapy   Revlimid was discontinued due to lack of response. Chemotherapy was changed back to Velcade injection twice a week, 2 weeks on 1 week off. Her treatment  was switched to to minimum response   04/20/2014 - 06/02/2014 Chemotherapy   chemotherapy is switched to Carfilzomib, Cytoxan and dexamethasone.   04/22/2014 Procedure   she has placement of port for  chemotherapy.   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 at [facility]   10/20/2014 - 03/24/2015 Chemotherapy    she received chemotherapy with Kyprolis, Revlimid and dexamethasone   10/22/2014 Procedure   She has port placement   01/19/2015 Tumor Marker   IgA lambda M spike at 0.4 g    01/20/2015 Miscellaneous   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 No cource was found and fever resolved with IV vancomycin and meropenem   09/13/2015 Bone Marrow Biopsy   Bone marrow biopsy showed no increased blasts, 3-4 % plasma cells   03/02/2016 Bone Marrow Biopsy   Bone marrow biopsy at [facility] showed normocellular (30%) bone marrow with trilineage hematopoiesis. No significant increase in blasts. No significant increase in plasma cells.   05/12/2016 Imaging   DEXA scan at [place] showed osteopenia   10/24/2016 Imaging   Skeletal survey at [facility], no new lesions   12/07/2017 Imaging   No focal abnormality noted to suggest myeloma. Exam is stable from prior exam.   03/01/2018 Procedure   Successful 8 French right internal jugular vein power port placement with its tip at the  SVC/RA junction.   03/06/2018 -  Chemotherapy   The patient had daratumumab    07/09/2018 Bone Marrow Biopsy   Bone marrow biopsy at [facility] showed residual disease at 0.004% plasma cells   07/03/2019 Bone Marrow Biopsy   A. Bone marrow, flow cytometric analysis for multiple myeloma minimal residual disease detection:   Negative. No phenotypically abnormal plasma cells at or above the limit of detection identified.     No monotypic B-cell population identified. Negative for increased blasts.   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[place])  04/06/2015 Bone Marrow Biopsy   Accession: [id number] BM biopsy showed RAEB-1   04/06/2015 Tumor Marker   Cytogenetics and FISH for MDS are within normal limits   10/06/2015 - 10/10/2015 Chemotherapy   She received conditioning chemotherapy with busulfan and melphalan   10/12/2015 Bone Marrow Transplant   She received allogenic stem cell transplant   10/19/2015 Adverse Reaction   She developed posttransplant complication with mucositis, viral infection with rhinovirus, neutropenic fever, bilateral pleural effusion and moderate pericardial effusion and CMV reactivation.   10/31/2015 Miscellaneous   She has engrafted     REVIEW OF SYSTEMS:   Constitutional: Denies fevers, chills or abnormal weight loss Eyes: Denies blurriness of vision Ears, nose, mouth, throat, and face: Denies mucositis or sore throat Respiratory: Denies  cough, dyspnea or wheezes Cardiovascular: Denies palpitation, chest discomfort or lower extremity swelling Gastrointestinal:  Denies nausea, heartburn or change in bowel habits Skin: Denies abnormal skin rashes Lymphatics: Denies new lymphadenopathy or easy bruising Neurological:Denies numbness, tingling or new weaknesses Behavioral/Psych: Mood is stable, no new changes  All other systems were reviewed with the patient and are negative.  I have reviewed the past medical history, past surgical history, social  history and family history with the patient and they are unchanged from previous note.  ALLERGIES:  has No Known Allergies.  MEDICATIONS:  Current Outpatient Medications  Medication Sig Dispense Refill  . acyclovir (ZOVIRAX) 400 MG tablet TAKE 1 TABLET BY MOUTH TWICE A DAY 180 tablet 11  . aspirin 81 MG chewable tablet Chew 325 mg by mouth daily. Takes 4 tablets (325 mg daily) 21 days out of the month with chemo drug    . calcipotriene (DOVONOX) 0.005 % cream daily.     . calcium carbonate (TUMS - DOSED IN MG ELEMENTAL CALCIUM) 500 MG chewable tablet Chew 1 tablet by mouth daily.    . Cholecalciferol (VITAMIN D-1000 MAX ST) 1000 units tablet Take 1,000 Units by mouth daily.    . cyanocobalamin 1000 MCG tablet Take 1,000 mcg by mouth daily.    Marland Kitchen DARATUMUMAB IV Inject into the vein every 30 (thirty) days.    Marland Kitchen docusate sodium (COLACE) 100 MG capsule Take 100 mg by mouth daily as needed for mild constipation.     Marland Kitchen estradiol (ESTRACE VAGINAL) 0.1 MG/GM vaginal cream Place 1 Applicatorful vaginally at bedtime. 42.5 g 12  . loperamide (IMODIUM) 2 MG capsule Take by mouth as needed for diarrhea or loose stools. As needed only    . metoprolol tartrate (LOPRESSOR) 25 MG tablet Take 37.5 mg by mouth 2 (two) times daily.    . mirtazapine (REMERON) 15 MG tablet TAKE 1 TABLET BY MOUTH EVERYDAY AT BEDTIME 90 tablet 1  . Multiple Vitamin (MULTIVITAMIN WITH MINERALS) TABS tablet Take 1 tablet by mouth daily.    . ondansetron (ZOFRAN) 8 MG tablet Take 1 tablet (8 mg total) by mouth every 8 (eight) hours as needed (Nausea or vomiting). 30 tablet 1  . pantoprazole (PROTONIX) 40 MG tablet TAKE 1 TABLET (40 MG TOTAL) BY MOUTH 2 (TWO) TIMES DAILY. TAKE 30-60 MINUTES BEFORE BREAKFAST AND DINNER 180 tablet 2  . pomalidomide (POMALYST) 2 MG capsule Take 1 capsule (2 mg total) by mouth daily. Take with water on days 1-21. Repeat every 28 days. 21 capsule 0  . prochlorperazine (COMPAZINE) 10 MG tablet Take 1 tablet (10  mg total) by mouth every 6 (six) hours as needed (Nausea or vomiting). 30 tablet 1   No current facility-administered medications for this visit.   Facility-Administered Medications Ordered in Other Visits  Medication Dose Route Frequency Provider Last Rate Last Admin  . daratumumab (DARZALEX) 1,200 mg in sodium chloride 0.9 % 440 mL chemo infusion  16 mg/kg (Treatment Plan Recorded) Intravenous Once Jodeci Rini, MD      . heparin lock flush 100 unit/mL  500 Units Intracatheter Once PRN Alvy Bimler, Caniya Tagle, MD      . sodium chloride flush (NS) 0.9 % injection 10 mL  10 mL Intracatheter PRN Alvy Bimler, Rayan Ines, MD        PHYSICAL EXAMINATION: ECOG PERFORMANCE STATUS: 1 - Symptomatic but completely ambulatory  Vitals:   10/09/19 0914  BP: 125/68  Pulse: (!) 57  Resp: 18  Temp: 98 F (36.7 C)  SpO2:  98%   Filed Weights   10/09/19 0914  Weight: 164 lb 11.2 oz (74.7 kg)    GENERAL:alert, no distress and comfortable NEURO: alert & oriented x 3 with fluent speech, no focal motor/sensory deficits  LABORATORY DATA:  I have reviewed the data as listed    Component Value Date/Time   NA 139 10/09/2019 0833   NA 142 01/04/2017 0902   K 4.2 10/09/2019 0833   K 3.9 01/04/2017 0902   CL 106 10/09/2019 0833   CO2 27 10/09/2019 0833   CO2 28 01/04/2017 0902   GLUCOSE 89 10/09/2019 0833   GLUCOSE 92 01/04/2017 0902   BUN 14 10/09/2019 0833   BUN 9.5 01/04/2017 0902   CREATININE 0.77 10/09/2019 0833   CREATININE 0.80 12/18/2018 0901   CREATININE 0.7 01/04/2017 0902   CALCIUM 9.1 10/09/2019 0833   CALCIUM 9.0 01/04/2017 0902   PROT 6.0 (L) 10/09/2019 0833   PROT 6.0 (L) 01/04/2017 0902   PROT 5.8 (L) 01/04/2017 0902   ALBUMIN 3.5 10/09/2019 0833   ALBUMIN 3.4 (L) 01/04/2017 0902   AST 10 (L) 10/09/2019 0833   AST 18 12/18/2018 0901   AST 21 01/04/2017 0902   ALT 8 10/09/2019 0833   ALT 14 12/18/2018 0901   ALT 16 01/04/2017 0902   ALKPHOS 70 10/09/2019 0833   ALKPHOS 101 01/04/2017 0902    BILITOT 0.3 10/09/2019 0833   BILITOT 0.3 12/18/2018 0901   BILITOT 0.56 01/04/2017 0902   GFRNONAA >60 10/09/2019 0833   GFRNONAA >60 12/18/2018 0901   GFRAA >60 10/09/2019 0833   GFRAA >60 12/18/2018 0901    No results found for: SPEP, UPEP  Lab Results  Component Value Date   WBC 4.4 10/09/2019   NEUTROABS 1.6 (L) 10/09/2019   HGB 12.8 10/09/2019   HCT 38.6 10/09/2019   MCV 104.9 (H) 10/09/2019   PLT 225 10/09/2019      Chemistry      Component Value Date/Time   NA 139 10/09/2019 0833   NA 142 01/04/2017 0902   K 4.2 10/09/2019 0833   K 3.9 01/04/2017 0902   CL 106 10/09/2019 0833   CO2 27 10/09/2019 0833   CO2 28 01/04/2017 0902   BUN 14 10/09/2019 0833   BUN 9.5 01/04/2017 0902   CREATININE 0.77 10/09/2019 0833   CREATININE 0.80 12/18/2018 0901   CREATININE 0.7 01/04/2017 0902      Component Value Date/Time   CALCIUM 9.1 10/09/2019 0833   CALCIUM 9.0 01/04/2017 0902   ALKPHOS 70 10/09/2019 0833   ALKPHOS 101 01/04/2017 0902   AST 10 (L) 10/09/2019 0833   AST 18 12/18/2018 0901   AST 21 01/04/2017 0902   ALT 8 10/09/2019 0833   ALT 14 12/18/2018 0901   ALT 16 01/04/2017 0902   BILITOT 0.3 10/09/2019 0833   BILITOT 0.3 12/18/2018 0901   BILITOT 0.56 01/04/2017 0902

## 2019-10-09 NOTE — Assessment & Plan Note (Signed)
Her white count is stable but she has mild neutropenia She is not symptomatic Observe only for now

## 2019-10-09 NOTE — Assessment & Plan Note (Signed)
The patient appears to be in complete remission with negative MRD I have reviewed documentation and blood work from [facility] We will proceed with treatment as scheduled now She remains on acyclovir for antimicrobial prophylaxis She will continue aspirin therapy for DVT prophylaxis She will continue calcium and vitamin D along with Zometa

## 2019-10-10 ENCOUNTER — Encounter: Payer: Self-pay | Admitting: Hematology and Oncology

## 2019-10-10 ENCOUNTER — Telehealth: Payer: Self-pay | Admitting: Hematology and Oncology

## 2019-10-10 LAB — KAPPA/LAMBDA LIGHT CHAINS
Kappa free light chain: 21.3 mg/L — ABNORMAL HIGH (ref 3.3–19.4)
Kappa, lambda light chain ratio: 1.02 (ref 0.26–1.65)
Lambda free light chains: 20.9 mg/L (ref 5.7–26.3)

## 2019-10-10 NOTE — Telephone Encounter (Signed)
Scheduled per 04/15 scheduled message, called patient regarding upcoming appointments.

## 2019-10-13 LAB — MULTIPLE MYELOMA PANEL, SERUM
Albumin SerPl Elph-Mcnc: 3.5 g/dL (ref 2.9–4.4)
Albumin/Glob SerPl: 1.6 (ref 0.7–1.7)
Alpha 1: 0.2 g/dL (ref 0.0–0.4)
Alpha2 Glob SerPl Elph-Mcnc: 0.7 g/dL (ref 0.4–1.0)
B-Globulin SerPl Elph-Mcnc: 0.9 g/dL (ref 0.7–1.3)
Gamma Glob SerPl Elph-Mcnc: 0.5 g/dL (ref 0.4–1.8)
Globulin, Total: 2.2 g/dL (ref 2.2–3.9)
IgA: 216 mg/dL (ref 64–422)
IgG (Immunoglobin G), Serum: 528 mg/dL — ABNORMAL LOW (ref 586–1602)
IgM (Immunoglobulin M), Srm: 51 mg/dL (ref 26–217)
Total Protein ELP: 5.7 g/dL — ABNORMAL LOW (ref 6.0–8.5)

## 2019-10-14 ENCOUNTER — Telehealth: Payer: Self-pay

## 2019-10-14 NOTE — Telephone Encounter (Signed)
Called and given below message. She verbalized understanding. 

## 2019-10-14 NOTE — Telephone Encounter (Signed)
-----   Message from [name], MD sent at 10/14/2019  9:04 AM EDT ----- Regarding: pls let he rknow myeloma panel in remission

## 2019-10-31 NOTE — Progress Notes (Signed)
Pharmacist Chemotherapy Monitoring - Follow Up Assessment    I verify that I have reviewed each item in the below checklist:  . Regimen for the patient is scheduled for the appropriate day and plan matches scheduled date. Marland Kitchen Appropriate non-routine labs are ordered dependent on drug ordered. . If applicable, additional medications reviewed and ordered per protocol based on lifetime cumulative doses and/or treatment regimen.   Plan for follow-up and/or issues identified: Yes . I-vent associated with next due treatment: Yes . MD and/or nursing notified: No   Kennith Center, Pharm.D., CPP 10/31/2019@3 :03 PM

## 2019-11-04 ENCOUNTER — Other Ambulatory Visit: Payer: Self-pay

## 2019-11-04 DIAGNOSIS — C9002 Multiple myeloma in relapse: Secondary | ICD-10-CM

## 2019-11-04 MED ORDER — POMALIDOMIDE 2 MG PO CAPS: 2.0000 mg | ORAL_CAPSULE | Freq: Every day | ORAL | 0 refills | Status: DC

## 2019-11-06 ENCOUNTER — Encounter: Payer: Self-pay | Admitting: Hematology and Oncology

## 2019-11-06 ENCOUNTER — Inpatient Hospital Stay: Payer: Medicare HMO | Attending: Hematology and Oncology

## 2019-11-06 ENCOUNTER — Inpatient Hospital Stay: Payer: Medicare HMO

## 2019-11-06 ENCOUNTER — Other Ambulatory Visit: Payer: Self-pay

## 2019-11-06 ENCOUNTER — Other Ambulatory Visit: Payer: Medicare HMO

## 2019-11-06 ENCOUNTER — Telehealth: Payer: Self-pay | Admitting: Hematology and Oncology

## 2019-11-06 ENCOUNTER — Ambulatory Visit: Payer: Medicare HMO | Admitting: Hematology and Oncology

## 2019-11-06 ENCOUNTER — Inpatient Hospital Stay: Payer: Medicare HMO | Admitting: Hematology and Oncology

## 2019-11-06 ENCOUNTER — Ambulatory Visit: Payer: Medicare HMO

## 2019-11-06 VITALS — BP 121/74 | HR 63 | Resp 18

## 2019-11-06 DIAGNOSIS — D469 Myelodysplastic syndrome, unspecified: Secondary | ICD-10-CM | POA: Diagnosis not present

## 2019-11-06 DIAGNOSIS — C9000 Multiple myeloma not having achieved remission: Secondary | ICD-10-CM

## 2019-11-06 DIAGNOSIS — Z9221 Personal history of antineoplastic chemotherapy: Secondary | ICD-10-CM | POA: Insufficient documentation

## 2019-11-06 DIAGNOSIS — Z5112 Encounter for antineoplastic immunotherapy: Secondary | ICD-10-CM | POA: Insufficient documentation

## 2019-11-06 DIAGNOSIS — C9002 Multiple myeloma in relapse: Secondary | ICD-10-CM | POA: Diagnosis present

## 2019-11-06 DIAGNOSIS — Z9481 Bone marrow transplant status: Secondary | ICD-10-CM | POA: Diagnosis not present

## 2019-11-06 DIAGNOSIS — Z7952 Long term (current) use of systemic steroids: Secondary | ICD-10-CM | POA: Insufficient documentation

## 2019-11-06 DIAGNOSIS — K219 Gastro-esophageal reflux disease without esophagitis: Secondary | ICD-10-CM

## 2019-11-06 DIAGNOSIS — M8580 Other specified disorders of bone density and structure, unspecified site: Secondary | ICD-10-CM | POA: Diagnosis not present

## 2019-11-06 DIAGNOSIS — Z79899 Other long term (current) drug therapy: Secondary | ICD-10-CM | POA: Diagnosis not present

## 2019-11-06 DIAGNOSIS — R5383 Other fatigue: Secondary | ICD-10-CM | POA: Diagnosis not present

## 2019-11-06 DIAGNOSIS — Z7982 Long term (current) use of aspirin: Secondary | ICD-10-CM | POA: Insufficient documentation

## 2019-11-06 LAB — CBC WITH DIFFERENTIAL/PLATELET
Abs Immature Granulocytes: 0.01 10*3/uL (ref 0.00–0.07)
Basophils Absolute: 0 10*3/uL (ref 0.0–0.1)
Basophils Relative: 1 %
Eosinophils Absolute: 0 10*3/uL (ref 0.0–0.5)
Eosinophils Relative: 0 %
HCT: 37.3 % (ref 36.0–46.0)
Hemoglobin: 12.2 g/dL (ref 12.0–15.0)
Immature Granulocytes: 0 %
Lymphocytes Relative: 47 %
Lymphs Abs: 2.3 10*3/uL (ref 0.7–4.0)
MCH: 35.2 pg — ABNORMAL HIGH (ref 26.0–34.0)
MCHC: 32.7 g/dL (ref 30.0–36.0)
MCV: 107.5 fL — ABNORMAL HIGH (ref 80.0–100.0)
Monocytes Absolute: 0.8 10*3/uL (ref 0.1–1.0)
Monocytes Relative: 17 %
Neutro Abs: 1.7 10*3/uL (ref 1.7–7.7)
Neutrophils Relative %: 35 %
Platelets: 225 10*3/uL (ref 150–400)
RBC: 3.47 MIL/uL — ABNORMAL LOW (ref 3.87–5.11)
RDW: 13.6 % (ref 11.5–15.5)
WBC: 4.7 10*3/uL (ref 4.0–10.5)
nRBC: 0 % (ref 0.0–0.2)

## 2019-11-06 LAB — COMPREHENSIVE METABOLIC PANEL
ALT: 8 U/L (ref 0–44)
AST: 10 U/L — ABNORMAL LOW (ref 15–41)
Albumin: 3.3 g/dL — ABNORMAL LOW (ref 3.5–5.0)
Alkaline Phosphatase: 77 U/L (ref 38–126)
Anion gap: 9 (ref 5–15)
BUN: 13 mg/dL (ref 8–23)
CO2: 25 mmol/L (ref 22–32)
Calcium: 8.5 mg/dL — ABNORMAL LOW (ref 8.9–10.3)
Chloride: 108 mmol/L (ref 98–111)
Creatinine, Ser: 0.72 mg/dL (ref 0.44–1.00)
GFR calc Af Amer: >60 mL/min (ref >60)
GFR calc non Af Amer: >60 mL/min (ref >60)
Glucose, Bld: 97 mg/dL (ref 70–99)
Potassium: 3.8 mmol/L (ref 3.5–5.1)
Sodium: 142 mmol/L (ref 135–145)
Total Bilirubin: 0.2 mg/dL — ABNORMAL LOW (ref 0.3–1.2)
Total Protein: 6 g/dL — ABNORMAL LOW (ref 6.5–8.1)

## 2019-11-06 MED ORDER — DIPHENHYDRAMINE HCL 25 MG PO CAPS
25.0000 mg | ORAL_CAPSULE | Freq: Once | ORAL | Status: AC
  Administered 2019-11-06: 25 mg via ORAL

## 2019-11-06 MED ORDER — ACETAMINOPHEN 325 MG PO TABS
650.0000 mg | ORAL_TABLET | Freq: Once | ORAL | Status: AC
  Administered 2019-11-06: 650 mg via ORAL

## 2019-11-06 MED ORDER — SODIUM CHLORIDE 0.9% FLUSH
10.0000 mL | INTRAVENOUS | Status: DC | PRN
  Administered 2019-11-06: 10 mL
  Filled 2019-11-06: qty 10

## 2019-11-06 MED ORDER — DEXAMETHASONE SODIUM PHOSPHATE 10 MG/ML IJ SOLN: 10.0000 mg | Freq: Once | INTRAMUSCULAR | Status: DC

## 2019-11-06 MED ORDER — SODIUM CHLORIDE 0.9% FLUSH
3.0000 mL | Freq: Once | INTRAVENOUS | Status: AC | PRN
  Administered 2019-11-06: 3 mL
  Filled 2019-11-06: qty 10

## 2019-11-06 MED ORDER — SODIUM CHLORIDE 0.9 % IV SOLN
Freq: Once | INTRAVENOUS | Status: AC
  Filled 2019-11-06: qty 250

## 2019-11-06 MED ORDER — DIPHENHYDRAMINE HCL 25 MG PO CAPS
ORAL_CAPSULE | ORAL | Status: AC
  Filled 2019-11-06: qty 1

## 2019-11-06 MED ORDER — HEPARIN SOD (PORK) LOCK FLUSH 100 UNIT/ML IV SOLN
500.0000 [IU] | Freq: Once | INTRAVENOUS | Status: AC | PRN
  Administered 2019-11-06: 500 [IU]
  Filled 2019-11-06: qty 5

## 2019-11-06 MED ORDER — PROCHLORPERAZINE MALEATE 10 MG PO TABS
10.0000 mg | ORAL_TABLET | Freq: Once | ORAL | Status: AC
  Administered 2019-11-06: 10 mg via ORAL

## 2019-11-06 MED ORDER — ACETAMINOPHEN 325 MG PO TABS
ORAL_TABLET | ORAL | Status: AC
  Filled 2019-11-06: qty 2

## 2019-11-06 MED ORDER — SODIUM CHLORIDE 0.9 % IV SOLN
10.0000 mg | Freq: Once | INTRAVENOUS | Status: AC
  Administered 2019-11-06: 10 mg via INTRAVENOUS
  Filled 2019-11-06: qty 10

## 2019-11-06 MED ORDER — SODIUM CHLORIDE 0.9 % IV SOLN
16.0000 mg/kg | Freq: Once | INTRAVENOUS | Status: AC
  Administered 2019-11-06: 1200 mg via INTRAVENOUS
  Filled 2019-11-06: qty 60

## 2019-11-06 MED ORDER — PROCHLORPERAZINE MALEATE 10 MG PO TABS
ORAL_TABLET | ORAL | Status: AC
  Filled 2019-11-06: qty 1

## 2019-11-06 NOTE — Patient Instructions (Signed)
[facility] Discharge Instructions for Patients Receiving Chemotherapy  Today you received the following chemotherapy agents: Daratumumab (Darzalex)   To help prevent nausea and vomiting after your treatment, we encourage you to take your nausea medication  as prescribed.    If you develop nausea and vomiting that is not controlled by your nausea medication, call the clinic.   BELOW ARE SYMPTOMS THAT SHOULD BE REPORTED IMMEDIATELY:  *FEVER GREATER THAN 100.5 F  *CHILLS WITH OR WITHOUT FEVER  NAUSEA AND VOMITING THAT IS NOT CONTROLLED WITH YOUR NAUSEA MEDICATION  *UNUSUAL SHORTNESS OF BREATH  *UNUSUAL BRUISING OR BLEEDING  TENDERNESS IN MOUTH AND THROAT WITH OR WITHOUT PRESENCE OF ULCERS  *URINARY PROBLEMS  *BOWEL PROBLEMS  UNUSUAL RASH Items with * indicate a potential emergency and should be followed up as soon as possible.  Feel free to call the clinic should you have any questions or concerns. The clinic phone number is [phone].  Please show the CHEMO ALERT CARD at check-in to the Emergency Department and triage nurse.   

## 2019-11-06 NOTE — Progress Notes (Signed)
[place] OFFICE PROGRESS NOTE  Patient Care Team: [name], [name], MD as PCP - General (Internal Medicine) [name], MD as PCP - Cardiology (Cardiology) [name], MD as Referring Physician (Hematology and Oncology) [name], MD as Consulting Physician (Internal Medicine) [name], [name], MD as Consulting Physician (Infectious Diseases) [name], MD as Consulting Physician (Hematology and Oncology) [name], NP as Nurse Practitioner (Hematology and Oncology)  ASSESSMENT & PLAN:  Multiple myeloma [facility]) The patient appears to be in complete remission with negative MRD We will proceed with treatment as scheduled now She remains on acyclovir for antimicrobial prophylaxis She will continue aspirin therapy for DVT prophylaxis She will continue calcium and vitamin D along with Zometa  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[place]) Recent bone marrow biopsy shows she is fully engrafted with more than 98% donor cells She will continue follow-up at [facility]  Other fatigue She continues to complain of fatigue We discussed that Remeron sometimes can cause problems with sedation and fatigue the following day She wants to continue taking Remeron for now I encouraged her to continue her exercise regimen as tolerated  GERD (gastroesophageal reflux disease) She has profound GERD despite taking proton pump inhibitor and calcium carbonate She has appointment to see GI service for further evaluation and I think is reasonable   No orders of the defined types were placed in this encounter.   All questions were answered. The patient knows to call the clinic with any problems, questions or concerns. The total time spent in the appointment was 25 minutes encounter with patients including review of chart and various tests results, discussions about plan of care and coordination of care plan   [name],  MD 11/06/2019 9:16 AM  INTERVAL HISTORY: Please see below for problem oriented charting. She returns for myeloma follow-up She tolerated recent treatment well She complained of persistent neuropathy and cold extremities She has chronic fatigue She has appointment to see GI service for evaluation of GERD.  She has been having significant heartburn lately She started going to the gym several weeks ago She is still battling with problems related to bladder prolapse She continues to take Remeron to help her with her sleep at night  SUMMARY OF ONCOLOGIC HISTORY: Oncology History Overview Note  Multiple myeloma, Ig A Lambda, M spike 3.54 grams, Calcium 9.2, Creatinine 0.8, Beta 2 microglobulin 4.52, IgA 4840 mg/dL, lambda light chain 75.4, albumin 3.6, hemoglobin 9.7, platelet 115    Primary site: Multiple Myeloma   Staging method: AJCC 6th Edition   Clinical: Stage IIA signed by [name], MD on 11/07/2013  2:46 PM   Summary: Stage IIA      Multiple myeloma ([place])  10/31/2013 Bone Marrow Biopsy   Bone marrow biopsy confirmed multiple myeloma with 40% bone marrow involvement. Skeletal survey showed minimal lesions in her score with generalized demineralization   11/10/2013 - 02/13/2014 Chemotherapy   The patient is started on induction chemotherapy with weekly dexamethasone 40 mg by mouth as well as Velcade subcutaneous injection on days 1, 4, 8 and 11. On 11/21/2013, she was started on monthly Zometa.   12/23/2013 Adverse Reaction   The dose of Velcade was reduced due to thrombocytopenia.   01/28/2014 - 04/07/2014 Chemotherapy   Revlimid is added. Treatment was discontinued due to lack of response.   02/24/2014 - 04/07/2014 Chemotherapy   Due to worsening peripheral neuropathy, Velcade injection is changed to once a week. Revlimid was given 21  days on, 7 days off.   04/07/2014 - 04/10/2014 Chemotherapy   Revlimid was discontinued due to lack of response. Chemotherapy was changed back to  Velcade injection twice a week, 2 weeks on 1 week off. Her treatment was switched to to minimum response   04/20/2014 - 06/02/2014 Chemotherapy   chemotherapy is switched to Carfilzomib, Cytoxan and dexamethasone.   04/22/2014 Procedure   she has placement of port for chemotherapy.   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 at [facility]   10/20/2014 - 03/24/2015 Chemotherapy    she received chemotherapy with Kyprolis, Revlimid and dexamethasone   10/22/2014 Procedure   She has port placement   01/19/2015 Tumor Marker   IgA lambda M spike at 0.4 g    01/20/2015 Miscellaneous   IVIG monthly was added for recurrent infections   02/02/2015 Miscellaneous   She received GCSF for severe neutropenia   02/26/2015 Bone Marrow Biopsy    she had bone marrow biopsy done at [facility] which showed mild pancytopenia but not diagnostic for myelodysplastic syndrome or multiple myeloma   07/22/2015 - 09/21/2015 Chemotherapy   She is receiving Daratumumab at [place] due to relapsed myeloma   08/03/2015 - 08/06/2015 Hospital Admission   She was admitted to the hospital for neutropenic fever. No cource was found and fever resolved with IV vancomycin and meropenem   09/13/2015 Bone Marrow Biopsy   Bone marrow biopsy showed no increased blasts, 3-4 % plasma cells   03/02/2016 Bone Marrow Biopsy   Bone marrow biopsy at [facility] showed normocellular (30%) bone marrow with trilineage hematopoiesis. No significant increase in blasts. No significant increase in plasma cells.   05/12/2016 Imaging   DEXA scan at [place] showed osteopenia   10/24/2016  Imaging   Skeletal survey at [facility], no new lesions   12/07/2017 Imaging   No focal abnormality noted to suggest myeloma. Exam is stable from prior exam.   03/01/2018 Procedure   Successful 8 French right internal jugular vein power port placement with its tip at the SVC/RA junction.   03/06/2018 -  Chemotherapy   The patient had daratumumab    07/09/2018 Bone Marrow Biopsy   Bone marrow biopsy at [facility] showed residual disease at 0.004% plasma cells   07/03/2019 Bone Marrow Biopsy   A. Bone marrow, flow cytometric analysis for multiple myeloma minimal residual disease detection:   Negative. No phenotypically abnormal plasma cells at or above the limit of detection identified.     No monotypic B-cell population identified. Negative for increased blasts.   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   10/31/2015 Miscellaneous  She has engrafted     REVIEW OF SYSTEMS:   Constitutional: Denies fevers, chills or abnormal weight loss Eyes: Denies blurriness of vision Ears, nose, mouth, throat, and face: Denies mucositis or sore throat Respiratory: Denies cough, dyspnea or wheezes Cardiovascular: Denies palpitation, chest discomfort or lower extremity swelling Skin: Denies abnormal skin rashes Lymphatics: Denies new lymphadenopathy or easy bruising Behavioral/Psych: Mood is stable, no new changes  All  other systems were reviewed with the patient and are negative.  I have reviewed the past medical history, past surgical history, social history and family history with the patient and they are unchanged from previous note.  ALLERGIES:  has No Known Allergies.  MEDICATIONS:  Current Outpatient Medications  Medication Sig Dispense Refill  . acyclovir (ZOVIRAX) 400 MG tablet TAKE 1 TABLET BY MOUTH TWICE A DAY 180 tablet 11  . aspirin 81 MG chewable tablet Chew 325 mg by mouth daily. Takes 4 tablets (325 mg daily) 21 days out of the month with chemo drug    . calcipotriene (DOVONOX) 0.005 % cream daily.     . calcium carbonate (TUMS - DOSED IN MG ELEMENTAL CALCIUM) 500 MG chewable tablet Chew 1 tablet by mouth daily.    . Cholecalciferol (VITAMIN D-1000 MAX ST) 1000 units tablet Take 1,000 Units by mouth daily.    . cyanocobalamin 1000 MCG tablet Take 1,000 mcg by mouth daily.    Marland Kitchen DARATUMUMAB IV Inject into the vein every 30 (thirty) days.    Marland Kitchen docusate sodium (COLACE) 100 MG capsule Take 100 mg by mouth daily as needed for mild constipation.     Marland Kitchen estradiol (ESTRACE VAGINAL) 0.1 MG/GM vaginal cream Place 1 Applicatorful vaginally at bedtime. 42.5 g 12  . loperamide (IMODIUM) 2 MG capsule Take by mouth as needed for diarrhea or loose stools. As needed only    . metoprolol tartrate (LOPRESSOR) 25 MG tablet Take 37.5 mg by mouth 2 (two) times daily.    . mirtazapine (REMERON) 15 MG tablet TAKE 1 TABLET BY MOUTH EVERYDAY AT BEDTIME 90 tablet 1  . Multiple Vitamin (MULTIVITAMIN WITH MINERALS) TABS tablet Take 1 tablet by mouth daily.    . ondansetron (ZOFRAN) 8 MG tablet Take 1 tablet (8 mg total) by mouth every 8 (eight) hours as needed (Nausea or vomiting). 30 tablet 1  . pantoprazole (PROTONIX) 40 MG tablet TAKE 1 TABLET (40 MG TOTAL) BY MOUTH 2 (TWO) TIMES DAILY. TAKE 30-60 MINUTES BEFORE BREAKFAST AND DINNER 180 tablet 2  . pomalidomide (POMALYST) 2 MG capsule Take 1 capsule (2 mg total) by  mouth daily. Take with water on days 1-21. Repeat every 28 days. 21 capsule 0  . prochlorperazine (COMPAZINE) 10 MG tablet Take 1 tablet (10 mg total) by mouth every 6 (six) hours as needed (Nausea or vomiting). 30 tablet 1   No current facility-administered medications for this visit.   Facility-Administered Medications Ordered in Other Visits  Medication Dose Route Frequency Provider Last Rate Last Admin  . 0.9 %  sodium chloride infusion   Intravenous Once Alvy Bimler, Deina Lipsey, MD      . acetaminophen (TYLENOL) tablet 650 mg  650 mg Oral Once Alvy Bimler, Sakshi Sermons, MD      . daratumumab (DARZALEX) 1,200 mg in sodium chloride 0.9 % 440 mL chemo infusion  16 mg/kg (Treatment Plan Recorded) Intravenous Once Conleigh Heinlein, MD      . dexamethasone (DECADRON) injection 10 mg  10 mg Intravenous Once [name], MD      .  diphenhydrAMINE (BENADRYL) capsule 25 mg  25 mg Oral Once Alvy Bimler, Via Rosado, MD      . heparin lock flush 100 unit/mL  500 Units Intracatheter Once PRN Alvy Bimler, Daking Westervelt, MD      . prochlorperazine (COMPAZINE) tablet 10 mg  10 mg Oral Once Alvy Bimler, Nigel Ericsson, MD      . sodium chloride flush (NS) 0.9 % injection 10 mL  10 mL Intracatheter PRN Alvy Bimler, Sherif Millspaugh, MD        PHYSICAL EXAMINATION: ECOG PERFORMANCE STATUS: 1 - Symptomatic but completely ambulatory  Vitals:   11/06/19 0829  BP: (!) 114/58  Pulse: (!) 59  Resp: 18  Temp: 98.6 F (37 C)  SpO2: 100%   Filed Weights   11/06/19 0829  Weight: 166 lb (75.3 kg)    GENERAL:alert, no distress and comfortable SKIN: skin color, texture, turgor are normal, no rashes or significant lesions EYES: normal, Conjunctiva are pink and non-injected, sclera clear OROPHARYNX:no exudate, no erythema and lips, buccal mucosa, and tongue normal  NECK: supple, thyroid normal size, non-tender, without nodularity LYMPH:  no palpable lymphadenopathy in the cervical, axillary or inguinal LUNGS: clear to auscultation and percussion with normal breathing effort HEART: regular rate &  rhythm and no murmurs and no lower extremity edema ABDOMEN:abdomen soft, non-tender and normal bowel sounds Musculoskeletal:no cyanosis of digits and no clubbing  NEURO: alert & oriented x 3 with fluent speech, no focal motor/sensory deficits  LABORATORY DATA:  I have reviewed the data as listed    Component Value Date/Time   NA 142 11/06/2019 0807   NA 142 01/04/2017 0902   K 3.8 11/06/2019 0807   K 3.9 01/04/2017 0902   CL 108 11/06/2019 0807   CO2 25 11/06/2019 0807   CO2 28 01/04/2017 0902   GLUCOSE 97 11/06/2019 0807   GLUCOSE 92 01/04/2017 0902   BUN 13 11/06/2019 0807   BUN 9.5 01/04/2017 0902   CREATININE 0.72 11/06/2019 0807   CREATININE 0.80 12/18/2018 0901   CREATININE 0.7 01/04/2017 0902   CALCIUM 8.5 (L) 11/06/2019 0807   CALCIUM 9.0 01/04/2017 0902   PROT 6.0 (L) 11/06/2019 0807   PROT 6.0 (L) 01/04/2017 0902   PROT 5.8 (L) 01/04/2017 0902   ALBUMIN 3.3 (L) 11/06/2019 0807   ALBUMIN 3.4 (L) 01/04/2017 0902   AST 10 (L) 11/06/2019 0807   AST 18 12/18/2018 0901   AST 21 01/04/2017 0902   ALT 8 11/06/2019 0807   ALT 14 12/18/2018 0901   ALT 16 01/04/2017 0902   ALKPHOS 77 11/06/2019 0807   ALKPHOS 101 01/04/2017 0902   BILITOT 0.2 (L) 11/06/2019 0807   BILITOT 0.3 12/18/2018 0901   BILITOT 0.56 01/04/2017 0902   GFRNONAA >60 11/06/2019 0807   GFRNONAA >60 12/18/2018 0901   GFRAA >60 11/06/2019 0807   GFRAA >60 12/18/2018 0901    No results found for: SPEP, UPEP  Lab Results  Component Value Date   WBC 4.7 11/06/2019   NEUTROABS 1.7 11/06/2019   HGB 12.2 11/06/2019   HCT 37.3 11/06/2019   MCV 107.5 (H) 11/06/2019   PLT 225 11/06/2019      Chemistry      Component Value Date/Time   NA 142 11/06/2019 0807   NA 142 01/04/2017 0902   K 3.8 11/06/2019 0807   K 3.9 01/04/2017 0902   CL 108 11/06/2019 0807   CO2 25 11/06/2019 0807   CO2 28 01/04/2017 0902   BUN 13 11/06/2019 0807   BUN 9.5 01/04/2017  0902   CREATININE 0.72 11/06/2019 0807    CREATININE 0.80 12/18/2018 0901   CREATININE 0.7 01/04/2017 0902      Component Value Date/Time   CALCIUM 8.5 (L) 11/06/2019 0807   CALCIUM 9.0 01/04/2017 0902   ALKPHOS 77 11/06/2019 0807   ALKPHOS 101 01/04/2017 0902   AST 10 (L) 11/06/2019 0807   AST 18 12/18/2018 0901   AST 21 01/04/2017 0902   ALT 8 11/06/2019 0807   ALT 14 12/18/2018 0901   ALT 16 01/04/2017 0902   BILITOT 0.2 (L) 11/06/2019 0807   BILITOT 0.3 12/18/2018 0901   BILITOT 0.56 01/04/2017 0902

## 2019-11-06 NOTE — Assessment & Plan Note (Signed)
She has profound GERD despite taking proton pump inhibitor and calcium carbonate She has appointment to see GI service for further evaluation and I think is reasonable

## 2019-11-06 NOTE — Assessment & Plan Note (Signed)
She continues to complain of fatigue We discussed that Remeron sometimes can cause problems with sedation and fatigue the following day She wants to continue taking Remeron for now I encouraged her to continue her exercise regimen as tolerated

## 2019-11-06 NOTE — Assessment & Plan Note (Signed)
The patient appears to be in complete remission with negative MRD We will proceed with treatment as scheduled now She remains on acyclovir for antimicrobial prophylaxis She will continue aspirin therapy for DVT prophylaxis She will continue calcium and vitamin D along with Zometa 

## 2019-11-06 NOTE — Assessment & Plan Note (Signed)
Recent bone marrow biopsy shows she is fully engrafted with more than 98% donor cells She will continue follow-up at [facility] 

## 2019-11-06 NOTE — Telephone Encounter (Signed)
Scheduled per 5/13 sch msg. Pt aware of appts on 6/10 and 7/8.

## 2019-11-07 LAB — KAPPA/LAMBDA LIGHT CHAINS
Kappa free light chain: 24 mg/L — ABNORMAL HIGH (ref 3.3–19.4)
Kappa, lambda light chain ratio: 1.11 (ref 0.26–1.65)
Lambda free light chains: 21.7 mg/L (ref 5.7–26.3)

## 2019-11-10 LAB — MULTIPLE MYELOMA PANEL, SERUM
Albumin SerPl Elph-Mcnc: 3.3 g/dL (ref 2.9–4.4)
Albumin/Glob SerPl: 1.6 (ref 0.7–1.7)
Alpha 1: 0.2 g/dL (ref 0.0–0.4)
Alpha2 Glob SerPl Elph-Mcnc: 0.8 g/dL (ref 0.4–1.0)
B-Globulin SerPl Elph-Mcnc: 0.7 g/dL (ref 0.7–1.3)
Gamma Glob SerPl Elph-Mcnc: 0.5 g/dL (ref 0.4–1.8)
Globulin, Total: 2.2 g/dL (ref 2.2–3.9)
IgA: 210 mg/dL (ref 64–422)
IgG (Immunoglobin G), Serum: 441 mg/dL — ABNORMAL LOW (ref 586–1602)
IgM (Immunoglobulin M), Srm: 55 mg/dL (ref 26–217)
Total Protein ELP: 5.5 g/dL — ABNORMAL LOW (ref 6.0–8.5)

## 2019-11-11 ENCOUNTER — Encounter: Payer: Self-pay | Admitting: Hematology and Oncology

## 2019-11-13 ENCOUNTER — Encounter: Payer: Self-pay | Admitting: Internal Medicine

## 2019-11-13 ENCOUNTER — Ambulatory Visit: Payer: Medicare HMO | Admitting: Internal Medicine

## 2019-11-13 VITALS — BP 114/72 | HR 82 | Ht 62.0 in | Wt 163.6 lb

## 2019-11-13 DIAGNOSIS — K449 Diaphragmatic hernia without obstruction or gangrene: Secondary | ICD-10-CM | POA: Diagnosis not present

## 2019-11-13 DIAGNOSIS — Z1211 Encounter for screening for malignant neoplasm of colon: Secondary | ICD-10-CM | POA: Diagnosis not present

## 2019-11-13 DIAGNOSIS — K644 Residual hemorrhoidal skin tags: Secondary | ICD-10-CM | POA: Diagnosis not present

## 2019-11-13 DIAGNOSIS — K2100 Gastro-esophageal reflux disease with esophagitis, without bleeding: Secondary | ICD-10-CM | POA: Diagnosis not present

## 2019-11-13 NOTE — Progress Notes (Signed)
Jean Davidson 74 y.o. [birth year] [id number]  Assessment & Plan:   Encounter Diagnoses  Name Primary?  . Gastroesophageal reflux disease with esophagitis without hemorrhage Yes  . Large hiatal hernia   . Colon cancer screening   . Anal skin tag    Evaluate with UGI series EGD Screening colonoscopy  I explained that surgery will be necessary to correct problems with hiatal hernia and explained about possible hernia reduction and TIF though size of hiatal hernia could exclude the TIF  Cc:[name], [name], MD    Subjective:   Chief Complaint: hiatal hernia, colon cancer [place] reening  HPI 74 yo ww in remission from multiple myeloma with known GERD and hiatal hernia on PPI - which helps. However she experiences some lower chest pain, early satiety and intermittent regurgitation and vomiting. Ersoive esophagitis and 6 cm HH on last EGD 2018 Rare heartburn on PPI but the regurgitation and pain ar bothersome  Weight stable  Also due for screening colonoscopy  Also asking about removal of hemorrhoid/anal tag dx last visit me - "creates cleansing problems"  No Known Allergies Current Meds  Medication Sig  . acyclovir (ZOVIRAX) 400 MG tablet TAKE 1 TABLET BY MOUTH TWICE A DAY  . aspirin 81 MG chewable tablet Chew 325 mg by mouth daily. Takes 4 tablets (325 mg daily) 21 days out of the month with chemo drug  . calcipotriene (DOVONOX) 0.005 % cream daily.   . calcium carbonate (TUMS - DOSED IN MG ELEMENTAL CALCIUM) 500 MG chewable tablet Chew 1 tablet by mouth daily.  . Cholecalciferol (VITAMIN D-1000 MAX ST) 1000 units tablet Take 1,000 Units by mouth daily.  . cyanocobalamin 1000 MCG tablet Take 1,000 mcg by mouth daily.  Marland Kitchen DARATUMUMAB IV Inject into the vein every 30 (thirty) days.  Marland Kitchen docusate sodium (COLACE) 100 MG capsule Take 100 mg by mouth daily as needed for mild constipation.   Marland Kitchen estradiol (ESTRACE VAGINAL) 0.1 MG/GM vaginal cream Place 1 Applicatorful vaginally at  bedtime.  Marland Kitchen loperamide (IMODIUM) 2 MG capsule Take by mouth as needed for diarrhea or loose stools. As needed only  . metoprolol tartrate (LOPRESSOR) 25 MG tablet Take 37.5 mg by mouth 2 (two) times daily.  . mirtazapine (REMERON) 15 MG tablet TAKE 1 TABLET BY MOUTH EVERYDAY AT BEDTIME  . Multiple Vitamin (MULTIVITAMIN WITH MINERALS) TABS tablet Take 1 tablet by mouth daily.  . ondansetron (ZOFRAN) 8 MG tablet Take 1 tablet (8 mg total) by mouth every 8 (eight) hours as needed (Nausea or vomiting).  . pantoprazole (PROTONIX) 40 MG tablet TAKE 1 TABLET (40 MG TOTAL) BY MOUTH 2 (TWO) TIMES DAILY. TAKE 30-60 MINUTES BEFORE BREAKFAST AND DINNER  . pomalidomide (POMALYST) 2 MG capsule Take 1 capsule (2 mg total) by mouth daily. Take with water on days 1-21. Repeat every 28 days.  . prochlorperazine (COMPAZINE) 10 MG tablet Take 1 tablet (10 mg total) by mouth every 6 (six) hours as needed (Nausea or vomiting).   Past Medical History:  Diagnosis Date  . Anemia   . Anxiety   . Blood transfusion without reported diagnosis   . Carotid stenosis    Mild bilateral  . Carpal tunnel syndrome   . Cervical disc disease    c7  . Cough 07/27/2015  . Disc degeneration   . Dysuria 07/26/2015  . 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 Fatigue 01/26/2015  . Fever 07/27/2015  . GERD (gastroesophageal reflux disease)   . 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 Hypertension    Borderline.  . Internal and external hemorrhoids without complication   . 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 Multiple myeloma ([place])   . Pinched nerve   . Sinusitis, bacterial 07/27/2015  . Spinal stenosis in cervical region   . Varicose veins of lower extremities with inflammation    Past Surgical History:  Procedure Laterality Date  . BLADDER SUSPENSION  1988/1989   x2  . COLONOSCOPY    . ESOPHAGOGASTRODUODENOSCOPY    . IR IMAGING GUIDED PORT INSERTION   03/01/2018  . PORT-A-CATH REMOVAL    . SHOULDER SURGERY Right 04/06/13   right   Social History   Social History Narrative   HCPOA is daughter nema, [place] will,  full code ( reviewed 18)   family history includes Colon cancer (age of onset: 30) in her paternal aunt; Diabetes in her father; Heart disease in her brother, father, and mother; Heart failure in her father; Hypertension in her brother; Kidney disease in her father; Skin cancer in her brother and father; Throat cancer in her father.   Review of Systems   Objective:   Physical Exam _0  114/72   Pulse 82   Ht _1  (1.575 m)   Wt 163 lb 9.6 oz (74.2 kg)   SpO2 96%   BMI 29.92 kg/m @  General:  NAD Eyes:   anicteric Lungs:  clear Heart::  S1S2 no rubs, murmurs or gallops Abdomen:  soft and nontender, BS+    Data Reviewed:  See above

## 2019-11-13 NOTE — Patient Instructions (Signed)
If you are age 74 or older, your body mass index should be between 23-30. Your Body mass index is 29.92 kg/m. If this is out of the aforementioned range listed, please consider follow up with your Primary Care Provider.  If you are age 51 or younger, your body mass index should be between 19-25. Your Body mass index is 29.92 kg/m. If this is out of the aformentioned range listed, please consider follow up with your Primary Care Provider.   It has been recommended to you by your physician that you have an endoscopy and colonscopy completed. Per your request, we did not schedule the procedure(s) today. Please contact our office at [phone] should you decide to have the procedure completed. You will be scheduled for a pre-visit and procedure at that time.  You have been scheduled for an Upper GI Series at [facility] Your appointment is on 11/21/2019 at 9:30AM. Please arrive 15 minutes prior to your test for registration. Make sure not to eat or drink anything after 6:30AM  before your test. If you need to reschedule, please call radiology at [phone]. ________________________________________________________________ An upper GI series uses x rays to help diagnose problems of the upper GI tract, which includes the esophagus, stomach, and duodenum. The duodenum is the first part of the small intestine. An upper GI series is conducted by a radiology technologist or a radiologist--a doctor who specializes in x-ray imaging--at a hospital or outpatient center. While sitting or standing in front of an x-ray machine, the patient drinks barium liquid, which is often white and has a chalky consistency and taste. The barium liquid coats the lining of the upper GI tract and makes signs of disease show up more clearly on x rays. X-ray video, called fluoroscopy, is used to view the barium liquid moving through the esophagus, stomach, and duodenum. Additional x rays and fluoroscopy are performed while the  patient lies on an x-ray table. To fully coat the upper GI tract with barium liquid, the technologist or radiologist may press on the abdomen or ask the patient to change position. Patients hold still in various positions, allowing the technologist or radiologist to take x rays of the upper GI tract at different angles. If a technologist conducts the upper GI series, a radiologist will later examine the images to look for problems.  This test typically takes about 1 hour to complete. _____________________________________________________   I appreciate the opportunity to care for you. Silvano Rusk, MD, [facility]

## 2019-11-14 ENCOUNTER — Encounter: Payer: Self-pay | Admitting: Internal Medicine

## 2019-11-21 ENCOUNTER — Ambulatory Visit (HOSPITAL_COMMUNITY): Payer: Medicare HMO

## 2019-11-28 ENCOUNTER — Other Ambulatory Visit: Payer: Self-pay | Admitting: Internal Medicine

## 2019-11-28 ENCOUNTER — Ambulatory Visit (HOSPITAL_COMMUNITY)
Admission: RE | Admit: 2019-11-28 | Discharge: 2019-11-28 | Disposition: A | Discharge status: Home / Self Care | Payer: Medicare HMO | Source: Ambulatory Visit | Attending: Internal Medicine | Admitting: Internal Medicine

## 2019-11-28 ENCOUNTER — Other Ambulatory Visit: Payer: Self-pay

## 2019-11-28 DIAGNOSIS — K2100 Gastro-esophageal reflux disease with esophagitis, without bleeding: Secondary | ICD-10-CM | POA: Diagnosis present

## 2019-11-28 DIAGNOSIS — K449 Diaphragmatic hernia without obstruction or gangrene: Secondary | ICD-10-CM | POA: Insufficient documentation

## 2019-12-04 ENCOUNTER — Other Ambulatory Visit: Payer: Self-pay

## 2019-12-04 ENCOUNTER — Inpatient Hospital Stay: Payer: Medicare HMO

## 2019-12-04 ENCOUNTER — Other Ambulatory Visit: Payer: Self-pay | Admitting: Hematology and Oncology

## 2019-12-04 ENCOUNTER — Encounter: Payer: Self-pay | Admitting: Hematology and Oncology

## 2019-12-04 ENCOUNTER — Inpatient Hospital Stay: Payer: Medicare HMO | Attending: Hematology and Oncology | Admitting: Hematology and Oncology

## 2019-12-04 VITALS — BP 113/69 | HR 62 | Temp 98.6°F | Resp 17

## 2019-12-04 DIAGNOSIS — C9000 Multiple myeloma not having achieved remission: Secondary | ICD-10-CM

## 2019-12-04 DIAGNOSIS — D696 Thrombocytopenia, unspecified: Secondary | ICD-10-CM | POA: Diagnosis not present

## 2019-12-04 DIAGNOSIS — H269 Unspecified cataract: Secondary | ICD-10-CM | POA: Diagnosis not present

## 2019-12-04 DIAGNOSIS — Z79899 Other long term (current) drug therapy: Secondary | ICD-10-CM | POA: Insufficient documentation

## 2019-12-04 DIAGNOSIS — M8580 Other specified disorders of bone density and structure, unspecified site: Secondary | ICD-10-CM | POA: Diagnosis not present

## 2019-12-04 DIAGNOSIS — K2100 Gastro-esophageal reflux disease with esophagitis, without bleeding: Secondary | ICD-10-CM

## 2019-12-04 DIAGNOSIS — K219 Gastro-esophageal reflux disease without esophagitis: Secondary | ICD-10-CM | POA: Diagnosis not present

## 2019-12-04 DIAGNOSIS — Z5112 Encounter for antineoplastic immunotherapy: Secondary | ICD-10-CM | POA: Diagnosis not present

## 2019-12-04 DIAGNOSIS — D469 Myelodysplastic syndrome, unspecified: Secondary | ICD-10-CM

## 2019-12-04 DIAGNOSIS — C9002 Multiple myeloma in relapse: Secondary | ICD-10-CM

## 2019-12-04 DIAGNOSIS — Z7982 Long term (current) use of aspirin: Secondary | ICD-10-CM | POA: Insufficient documentation

## 2019-12-04 DIAGNOSIS — G629 Polyneuropathy, unspecified: Secondary | ICD-10-CM | POA: Insufficient documentation

## 2019-12-04 LAB — COMPREHENSIVE METABOLIC PANEL
ALT: 10 U/L (ref 0–44)
AST: 10 U/L — ABNORMAL LOW (ref 15–41)
Albumin: 3.5 g/dL (ref 3.5–5.0)
Alkaline Phosphatase: 75 U/L (ref 38–126)
Anion gap: 11 (ref 5–15)
BUN: 15 mg/dL (ref 8–23)
CO2: 24 mmol/L (ref 22–32)
Calcium: 9.4 mg/dL (ref 8.9–10.3)
Chloride: 105 mmol/L (ref 98–111)
Creatinine, Ser: 0.86 mg/dL (ref 0.44–1.00)
GFR calc Af Amer: >60 mL/min (ref >60)
GFR calc non Af Amer: >60 mL/min (ref >60)
Glucose, Bld: 107 mg/dL — ABNORMAL HIGH (ref 70–99)
Potassium: 4.3 mmol/L (ref 3.5–5.1)
Sodium: 140 mmol/L (ref 135–145)
Total Bilirubin: 0.5 mg/dL (ref 0.3–1.2)
Total Protein: 5.9 g/dL — ABNORMAL LOW (ref 6.5–8.1)

## 2019-12-04 LAB — CBC WITH DIFFERENTIAL/PLATELET
Abs Immature Granulocytes: 0.01 10*3/uL (ref 0.00–0.07)
Basophils Absolute: 0 10*3/uL (ref 0.0–0.1)
Basophils Relative: 1 %
Eosinophils Absolute: 0 10*3/uL (ref 0.0–0.5)
Eosinophils Relative: 0 %
HCT: 38.1 % (ref 36.0–46.0)
Hemoglobin: 12.6 g/dL (ref 12.0–15.0)
Immature Granulocytes: 0 %
Lymphocytes Relative: 47 %
Lymphs Abs: 2.3 10*3/uL (ref 0.7–4.0)
MCH: 35.5 pg — ABNORMAL HIGH (ref 26.0–34.0)
MCHC: 33.1 g/dL (ref 30.0–36.0)
MCV: 107.3 fL — ABNORMAL HIGH (ref 80.0–100.0)
Monocytes Absolute: 0.6 10*3/uL (ref 0.1–1.0)
Monocytes Relative: 12 %
Neutro Abs: 1.9 10*3/uL (ref 1.7–7.7)
Neutrophils Relative %: 40 %
Platelets: 226 10*3/uL (ref 150–400)
RBC: 3.55 MIL/uL — ABNORMAL LOW (ref 3.87–5.11)
RDW: 13.5 % (ref 11.5–15.5)
WBC: 4.8 10*3/uL (ref 4.0–10.5)
nRBC: 0 % (ref 0.0–0.2)

## 2019-12-04 MED ORDER — PROCHLORPERAZINE MALEATE 10 MG PO TABS
ORAL_TABLET | ORAL | Status: AC
  Filled 2019-12-04: qty 1

## 2019-12-04 MED ORDER — PROCHLORPERAZINE MALEATE 10 MG PO TABS
10.0000 mg | ORAL_TABLET | Freq: Once | ORAL | Status: AC
  Administered 2019-12-04: 10 mg via ORAL

## 2019-12-04 MED ORDER — HEPARIN SOD (PORK) LOCK FLUSH 100 UNIT/ML IV SOLN
500.0000 [IU] | Freq: Once | INTRAVENOUS | Status: AC | PRN
  Administered 2019-12-04: 500 [IU]
  Filled 2019-12-04: qty 5

## 2019-12-04 MED ORDER — DEXAMETHASONE SODIUM PHOSPHATE 10 MG/ML IJ SOLN
INTRAMUSCULAR | Status: AC
  Filled 2019-12-04: qty 1

## 2019-12-04 MED ORDER — ACETAMINOPHEN 325 MG PO TABS
ORAL_TABLET | ORAL | Status: AC
  Filled 2019-12-04: qty 2

## 2019-12-04 MED ORDER — SODIUM CHLORIDE 0.9% FLUSH
10.0000 mL | Freq: Once | INTRAVENOUS | Status: AC
  Administered 2019-12-04: 10 mL
  Filled 2019-12-04: qty 10

## 2019-12-04 MED ORDER — ACETAMINOPHEN 325 MG PO TABS
650.0000 mg | ORAL_TABLET | Freq: Once | ORAL | Status: AC
  Administered 2019-12-04: 650 mg via ORAL

## 2019-12-04 MED ORDER — SODIUM CHLORIDE 0.9 % IV SOLN
16.0000 mg/kg | Freq: Once | INTRAVENOUS | Status: AC
  Administered 2019-12-04: 1200 mg via INTRAVENOUS
  Filled 2019-12-04: qty 60

## 2019-12-04 MED ORDER — SODIUM CHLORIDE 0.9% FLUSH
10.0000 mL | INTRAVENOUS | Status: DC | PRN
  Administered 2019-12-04: 10 mL
  Filled 2019-12-04: qty 10

## 2019-12-04 MED ORDER — SODIUM CHLORIDE 0.9 % IV SOLN
Freq: Once | INTRAVENOUS | Status: AC
  Filled 2019-12-04: qty 250

## 2019-12-04 MED ORDER — DEXAMETHASONE SODIUM PHOSPHATE 10 MG/ML IJ SOLN
4.0000 mg | Freq: Once | INTRAMUSCULAR | Status: AC
  Administered 2019-12-04: 4 mg via INTRAVENOUS

## 2019-12-04 NOTE — Assessment & Plan Note (Signed)
She is symptomatic with excessive GERD She has appointment for EGD and colonoscopy I will defer to GI service for further follow-up and management

## 2019-12-04 NOTE — Assessment & Plan Note (Signed)
Her last bone marrow test results at [facility] showed complete remission with negative MRD We will proceed with treatment as scheduled now She is concerned about rising light chain levels I told her this is nonspecific For now, I do not recommend changing her treatment She remains on acyclovir for antimicrobial prophylaxis She will continue aspirin therapy for DVT prophylaxis She will continue calcium and vitamin D along with Zometa

## 2019-12-04 NOTE — Progress Notes (Signed)
[place] OFFICE PROGRESS NOTE  Patient Care Team: [name], [name], MD as PCP - General (Internal Medicine) [name], MD as PCP - Cardiology (Cardiology) [name], MD as Referring Physician (Hematology and Oncology) [name], MD as Consulting Physician (Internal Medicine) [name], [name], MD as Consulting Physician (Infectious Diseases) [name], MD as Consulting Physician (Hematology and Oncology) [name], NP as Nurse Practitioner (Hematology and Oncology)  ASSESSMENT & PLAN:  Multiple myeloma [facility]) Her last bone marrow test results at [facility] showed complete remission with negative MRD We will proceed with treatment as scheduled now She is concerned about rising light chain levels I told her this is nonspecific For now, I do not recommend changing her treatment She remains on acyclovir for antimicrobial prophylaxis She will continue aspirin therapy for DVT prophylaxis She will continue calcium and vitamin D along with Zometa  GERD (gastroesophageal reflux disease) She is symptomatic with excessive GERD She has appointment for EGD and colonoscopy I will defer to GI service for further follow-up and management  Bilateral cataracts We discussed risk and benefits of surgery There is no contraindication for her to proceed   No orders of the defined types were placed in this encounter.   All questions were answered. The patient knows to call the clinic with any problems, questions or concerns. The total time spent in the appointment was 20 minutes encounter with patients including review of chart and various tests results, discussions about plan of care and coordination of care plan   [name], MD 12/04/2019 12:00 PM  INTERVAL HISTORY: Please see below for problem oriented charting. She returns for treatment and follow-up She feels well No infusion reactions She is interested to undergo  cataract surgery She have some questions about abnormal light chain levels She is undergoing EGD and colonoscopy next month  SUMMARY OF ONCOLOGIC HISTORY: Oncology History Overview Note  Multiple myeloma, Ig A Lambda, M spike 3.54 grams, Calcium 9.2, Creatinine 0.8, Beta 2 microglobulin 4.52, IgA 4840 mg/dL, lambda light chain 75.4, albumin 3.6, hemoglobin 9.7, platelet 115    Primary site: Multiple Myeloma   Staging method: AJCC 6th Edition   Clinical: Stage IIA signed by [name], MD on 11/07/2013  2:46 PM   Summary: Stage IIA      Multiple myeloma ([place])  10/31/2013 Bone Marrow Biopsy   Bone marrow biopsy confirmed multiple myeloma with 40% bone marrow involvement. Skeletal survey showed minimal lesions in her score with generalized demineralization   11/10/2013 - 02/13/2014 Chemotherapy   The patient is started on induction chemotherapy with weekly dexamethasone 40 mg by mouth as well as Velcade subcutaneous injection on days 1, 4, 8 and 11. On 11/21/2013, she was started on monthly Zometa.   12/23/2013 Adverse Reaction   The dose of Velcade was reduced due to thrombocytopenia.   01/28/2014 - 04/07/2014 Chemotherapy   Revlimid is added. Treatment was discontinued due to lack of response.   02/24/2014 - 04/07/2014 Chemotherapy   Due to worsening peripheral neuropathy, Velcade injection is changed to once a week. Revlimid was given 21 days on, 7 days off.   04/07/2014 - 04/10/2014 Chemotherapy   Revlimid was discontinued due to lack of response. Chemotherapy was changed back to Velcade injection twice a week, 2 weeks on 1 week off. Her treatment was switched to to minimum response   04/20/2014 - 06/02/2014 Chemotherapy   chemotherapy is switched to Carfilzomib, Cytoxan and dexamethasone.   04/22/2014  Procedure   she has placement of port for chemotherapy.   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 at [facility]   10/20/2014 - 03/24/2015 Chemotherapy    she received chemotherapy with Kyprolis, Revlimid and dexamethasone   10/22/2014 Procedure   She has port placement   01/19/2015 Tumor Marker   IgA lambda M spike at 0.4 g    01/20/2015 Miscellaneous   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 No cource was found and fever resolved with IV vancomycin and meropenem   09/13/2015 Bone Marrow Biopsy   Bone marrow biopsy showed no increased blasts, 3-4 % plasma cells   03/02/2016 Bone Marrow Biopsy   Bone marrow biopsy at [facility] showed normocellular (30%) bone marrow with trilineage hematopoiesis. No significant increase in blasts. No significant increase in plasma cells.   05/12/2016 Imaging   DEXA scan at [place] showed osteopenia   10/24/2016 Imaging   Skeletal survey at [facility], no new lesions   12/07/2017 Imaging   No focal abnormality noted to suggest myeloma. Exam is stable from prior exam.   03/01/2018 Procedure   Successful 8 French right internal jugular vein power port placement with its tip at the SVC/RA junction.   03/06/2018 -  Chemotherapy   The patient had daratumumab    07/09/2018 Bone Marrow Biopsy   Bone marrow biopsy at [facility] showed  residual disease at 0.004% plasma cells   07/03/2019 Bone Marrow Biopsy   A. Bone marrow, flow cytometric analysis for multiple myeloma minimal residual disease detection:   Negative. No phenotypically abnormal plasma cells at or above the limit of detection identified.     No monotypic B-cell population identified. Negative for increased blasts.   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   10/31/2015 Miscellaneous   She has engrafted     REVIEW OF SYSTEMS:   Constitutional: Denies fevers, chills or abnormal weight loss Eyes: Denies blurriness of vision Ears, nose, mouth, throat, and face: Denies mucositis or sore throat Respiratory: Denies cough, dyspnea or wheezes Cardiovascular: Denies palpitation, chest discomfort or lower extremity swelling Skin: Denies abnormal skin rashes Lymphatics: Denies new lymphadenopathy or easy bruising  Neurological:Denies numbness, tingling or new weaknesses Behavioral/Psych: Mood is stable, no new changes  All other systems were reviewed with the patient and are negative.  I have reviewed the past medical history, past surgical history, social history and family history with the patient and they are unchanged from previous note.  ALLERGIES:  has No Known Allergies.  MEDICATIONS:  Current Outpatient Medications  Medication Sig Dispense Refill  . acyclovir  (ZOVIRAX) 400 MG tablet TAKE 1 TABLET BY MOUTH TWICE A DAY 180 tablet 11  . aspirin 81 MG chewable tablet Chew 325 mg by mouth daily. Takes 4 tablets (325 mg daily) 21 days out of the month with chemo drug    . BAYER ASPIRIN EC LOW DOSE 81 MG EC tablet     . calcipotriene (DOVONOX) 0.005 % cream daily.     . calcium carbonate (OS-CAL - DOSED IN MG OF ELEMENTAL CALCIUM) 1250 (500 Ca) MG tablet     . calcium carbonate (TUMS - DOSED IN MG ELEMENTAL CALCIUM) 500 MG chewable tablet Chew 1 tablet by mouth daily.    . Cholecalciferol (VITAMIN D-1000 MAX ST) 1000 units tablet Take 1,000 Units by mouth daily.    . cyanocobalamin 1000 MCG tablet Take 1,000 mcg by mouth daily.    Marland Kitchen DARATUMUMAB IV Inject into the vein every 30 (thirty) days.    Marland Kitchen docusate sodium (COLACE) 100 MG capsule Take 100 mg by mouth daily as needed for mild constipation.     Marland Kitchen estradiol (ESTRACE VAGINAL) 0.1 MG/GM vaginal cream Place 1 Applicatorful vaginally at bedtime. 42.5 g 12  . loperamide (IMODIUM) 2 MG capsule Take by mouth as needed for diarrhea or loose stools. As needed only    . metoprolol tartrate (LOPRESSOR) 25 MG tablet SMARTSIG:1.5 Tablet(s) By Mouth Twice Daily    . mirtazapine (REMERON) 15 MG tablet TAKE 1 TABLET BY MOUTH EVERYDAY AT BEDTIME 90 tablet 1  . Multiple Vitamin (MULTIVITAMIN WITH MINERALS) TABS tablet Take 1 tablet by mouth daily.    . ondansetron (ZOFRAN) 8 MG tablet Take 1 tablet (8 mg total) by mouth every 8 (eight) hours as needed (Nausea or vomiting). 30 tablet 1  . pantoprazole (PROTONIX) 40 MG tablet TAKE 1 TABLET (40 MG TOTAL) BY MOUTH 2 (TWO) TIMES DAILY. TAKE 30-60 MINUTES BEFORE BREAKFAST AND DINNER 180 tablet 2  . pomalidomide (POMALYST) 2 MG capsule Take 1 capsule (2 mg total) by mouth daily. Take with water on days 1-21. Repeat every 28 days. 21 capsule 0  . prochlorperazine (COMPAZINE) 10 MG tablet Take 1 tablet (10 mg total) by mouth every 6 (six) hours as needed (Nausea or vomiting). 30  tablet 1   No current facility-administered medications for this visit.   Facility-Administered Medications Ordered in Other Visits  Medication Dose Route Frequency Provider Last Rate Last Admin  . heparin lock flush 100 unit/mL  500 Units Intracatheter Once PRN Alvy Bimler, Ni, MD      . sodium chloride flush (NS) 0.9 % injection 10 mL  10 mL Intracatheter PRN Alvy Bimler, Ni, MD        PHYSICAL EXAMINATION: ECOG PERFORMANCE STATUS: 1 - Symptomatic but completely ambulatory  Vitals:   12/04/19 0840  BP: (!) 122/48  Pulse: 61  Resp: 18  Temp: 98.3 F (36.8 C)  SpO2: 99%   Filed Weights   12/04/19 0840  Weight: 164 lb 3.2 oz (74.5 kg)    GENERAL:alert, no distress and comfortable SKIN: skin color, texture, turgor are normal, no rashes  or significant lesions EYES: normal, Conjunctiva are pink and non-injected, sclera clear OROPHARYNX:no exudate, no erythema and lips, buccal mucosa, and tongue normal  NECK: supple, thyroid normal size, non-tender, without nodularity LYMPH:  no palpable lymphadenopathy in the cervical, axillary or inguinal LUNGS: clear to auscultation and percussion with normal breathing effort HEART: regular rate & rhythm and no murmurs and no lower extremity edema ABDOMEN:abdomen soft, non-tender and normal bowel sounds Musculoskeletal:no cyanosis of digits and no clubbing  NEURO: alert & oriented x 3 with fluent speech, no focal motor/sensory deficits  LABORATORY DATA:  I have reviewed the data as listed    Component Value Date/Time   NA 140 12/04/2019 0810   NA 142 01/04/2017 0902   K 4.3 12/04/2019 0810   K 3.9 01/04/2017 0902   CL 105 12/04/2019 0810   CO2 24 12/04/2019 0810   CO2 28 01/04/2017 0902   GLUCOSE 107 (H) 12/04/2019 0810   GLUCOSE 92 01/04/2017 0902   BUN 15 12/04/2019 0810   BUN 9.5 01/04/2017 0902   CREATININE 0.86 12/04/2019 0810   CREATININE 0.80 12/18/2018 0901   CREATININE 0.7 01/04/2017 0902   CALCIUM 9.4 12/04/2019 0810    CALCIUM 9.0 01/04/2017 0902   PROT 5.9 (L) 12/04/2019 0810   PROT 6.0 (L) 01/04/2017 0902   PROT 5.8 (L) 01/04/2017 0902   ALBUMIN 3.5 12/04/2019 0810   ALBUMIN 3.4 (L) 01/04/2017 0902   AST 10 (L) 12/04/2019 0810   AST 18 12/18/2018 0901   AST 21 01/04/2017 0902   ALT 10 12/04/2019 0810   ALT 14 12/18/2018 0901   ALT 16 01/04/2017 0902   ALKPHOS 75 12/04/2019 0810   ALKPHOS 101 01/04/2017 0902   BILITOT 0.5 12/04/2019 0810   BILITOT 0.3 12/18/2018 0901   BILITOT 0.56 01/04/2017 0902   GFRNONAA >60 12/04/2019 0810   GFRNONAA >60 12/18/2018 0901   GFRAA >60 12/04/2019 0810   GFRAA >60 12/18/2018 0901    No results found for: SPEP, UPEP  Lab Results  Component Value Date   WBC 4.8 12/04/2019   NEUTROABS 1.9 12/04/2019   HGB 12.6 12/04/2019   HCT 38.1 12/04/2019   MCV 107.3 (H) 12/04/2019   PLT 226 12/04/2019      Chemistry      Component Value Date/Time   NA 140 12/04/2019 0810   NA 142 01/04/2017 0902   K 4.3 12/04/2019 0810   K 3.9 01/04/2017 0902   CL 105 12/04/2019 0810   CO2 24 12/04/2019 0810   CO2 28 01/04/2017 0902   BUN 15 12/04/2019 0810   BUN 9.5 01/04/2017 0902   CREATININE 0.86 12/04/2019 0810   CREATININE 0.80 12/18/2018 0901   CREATININE 0.7 01/04/2017 0902      Component Value Date/Time   CALCIUM 9.4 12/04/2019 0810   CALCIUM 9.0 01/04/2017 0902   ALKPHOS 75 12/04/2019 0810   ALKPHOS 101 01/04/2017 0902   AST 10 (L) 12/04/2019 0810   AST 18 12/18/2018 0901   AST 21 01/04/2017 0902   ALT 10 12/04/2019 0810   ALT 14 12/18/2018 0901   ALT 16 01/04/2017 0902   BILITOT 0.5 12/04/2019 0810   BILITOT 0.3 12/18/2018 0901   BILITOT 0.56 01/04/2017 0902       RADIOGRAPHIC STUDIES: I have personally reviewed the radiological images as listed and agreed with the findings in the report. DG ESOPHAGUS W DOUBLE CM (HD)  Result Date: 11/28/2019 CLINICAL DATA:  Hiatal hernia, GERD EXAM: ESOPHOGRAM/BARIUM SWALLOW TECHNIQUE: Combined  double contrast  and single contrast examination performed using effervescent crystals, thick barium liquid, and thin barium liquid. FLUOROSCOPY TIME:  Fluoroscopy Time:  54 seconds Radiation Exposure Index (if provided by the fluoroscopic device): 7.5 mGy COMPARISON:  None. FINDINGS: Patient swallowed barium without difficulty. There is a moderate to large hiatal hernia. Relative narrowing at the gastroesophageal junction does not persist. Spontaneous gastroesophageal reflux is present. There is no mass. Normal motility. IMPRESSION: Moderate to large hiatal hernia with spontaneous gastroesophageal reflux. Otherwise unremarkable. Electronically Signed   By: Macy Mis M.D.   On: 11/28/2019 09:45

## 2019-12-04 NOTE — Patient Instructions (Signed)
[facility] Discharge Instructions for Patients Receiving Chemotherapy  Today you received the following chemotherapy agents: Darzalex  To help prevent nausea and vomiting after your treatment, we encourage you to take your nausea medication as directed.    If you develop nausea and vomiting that is not controlled by your nausea medication, call the clinic.   BELOW ARE SYMPTOMS THAT SHOULD BE REPORTED IMMEDIATELY:  *FEVER GREATER THAN 100.5 F  *CHILLS WITH OR WITHOUT FEVER  NAUSEA AND VOMITING THAT IS NOT CONTROLLED WITH YOUR NAUSEA MEDICATION  *UNUSUAL SHORTNESS OF BREATH  *UNUSUAL BRUISING OR BLEEDING  TENDERNESS IN MOUTH AND THROAT WITH OR WITHOUT PRESENCE OF ULCERS  *URINARY PROBLEMS  *BOWEL PROBLEMS  UNUSUAL RASH Items with * indicate a potential emergency and should be followed up as soon as possible.  Feel free to call the clinic should you have any questions or concerns. The clinic phone number is [phone].  Please show the CHEMO ALERT CARD at check-in to the Emergency Department and triage nurse.   

## 2019-12-04 NOTE — Assessment & Plan Note (Signed)
We discussed risk and benefits of surgery There is no contraindication for her to proceed

## 2019-12-05 ENCOUNTER — Encounter: Payer: Self-pay | Admitting: Hematology and Oncology

## 2019-12-05 LAB — KAPPA/LAMBDA LIGHT CHAINS
Kappa free light chain: 20 mg/L — ABNORMAL HIGH (ref 3.3–19.4)
Kappa, lambda light chain ratio: 0.92 (ref 0.26–1.65)
Lambda free light chains: 21.7 mg/L (ref 5.7–26.3)

## 2019-12-08 LAB — MULTIPLE MYELOMA PANEL, SERUM
Albumin SerPl Elph-Mcnc: 3.4 g/dL (ref 2.9–4.4)
Albumin/Glob SerPl: 1.6 (ref 0.7–1.7)
Alpha 1: 0.2 g/dL (ref 0.0–0.4)
Alpha2 Glob SerPl Elph-Mcnc: 0.8 g/dL (ref 0.4–1.0)
B-Globulin SerPl Elph-Mcnc: 0.8 g/dL (ref 0.7–1.3)
Gamma Glob SerPl Elph-Mcnc: 0.5 g/dL (ref 0.4–1.8)
Globulin, Total: 2.2 g/dL (ref 2.2–3.9)
IgA: 218 mg/dL (ref 64–422)
IgG (Immunoglobin G), Serum: 450 mg/dL — ABNORMAL LOW (ref 586–1602)
IgM (Immunoglobulin M), Srm: 43 mg/dL (ref 26–217)
Total Protein ELP: 5.6 g/dL — ABNORMAL LOW (ref 6.0–8.5)

## 2019-12-09 ENCOUNTER — Telehealth: Payer: Self-pay

## 2019-12-09 NOTE — Telephone Encounter (Signed)
-----   Message from [name], MD sent at 12/09/2019  8:12 AM EDT ----- Regarding: pls let her know Myeloma panel showed remission

## 2019-12-09 NOTE — Telephone Encounter (Signed)
Called and given below message. She verbalized understanding. 

## 2019-12-17 ENCOUNTER — Encounter: Payer: Self-pay | Admitting: Internal Medicine

## 2019-12-17 ENCOUNTER — Other Ambulatory Visit: Payer: Self-pay

## 2019-12-17 ENCOUNTER — Ambulatory Visit (AMBULATORY_SURGERY_CENTER): Payer: Self-pay | Admitting: *Deleted

## 2019-12-17 VITALS — Ht 62.0 in | Wt 163.6 lb

## 2019-12-17 DIAGNOSIS — K219 Gastro-esophageal reflux disease without esophagitis: Secondary | ICD-10-CM

## 2019-12-17 DIAGNOSIS — Z1211 Encounter for screening for malignant neoplasm of colon: Secondary | ICD-10-CM

## 2019-12-17 NOTE — Progress Notes (Signed)
2nd dose of covid vaccine 09-05-19  Pt is aware that care partner will wait in the car during procedure; if they feel like they will be too hot or cold to wait in the car; they may wait in the 4 th floor lobby. Patient is aware to bring only one care partner. We want them to wear a mask (we do not have any that we can provide them), practice social distancing, and we will check their temperatures when they get here.  I did remind the patient that their care partner needs to stay in the parking lot the entire time and have a cell phone available, we will call them when the pt is ready for discharge. Patient will wear mask into building.   No trouble with anesthesia, difficulty with intubation or hx/fam hx of malignant hyperthermia per pt   No egg or soy allergy  No home oxygen use   No medications for weight loss taken  Pt takes Colace "about every other day."  Sutab sample given

## 2019-12-22 ENCOUNTER — Encounter: Payer: Self-pay | Admitting: Hematology and Oncology

## 2019-12-22 ENCOUNTER — Telehealth: Payer: Self-pay | Admitting: *Deleted

## 2019-12-22 NOTE — Telephone Encounter (Signed)
Pomalyst refill formed signed and faxed to [phone] at [facility]. Confirmation fax was received.

## 2019-12-31 ENCOUNTER — Other Ambulatory Visit: Payer: Self-pay

## 2019-12-31 ENCOUNTER — Encounter: Payer: Self-pay | Admitting: Internal Medicine

## 2019-12-31 ENCOUNTER — Ambulatory Visit (AMBULATORY_SURGERY_CENTER): Payer: Medicare HMO | Admitting: Internal Medicine

## 2019-12-31 VITALS — BP 133/57 | HR 73 | Temp 96.6°F | Resp 18 | Ht 62.0 in | Wt 163.0 lb

## 2019-12-31 DIAGNOSIS — K219 Gastro-esophageal reflux disease without esophagitis: Secondary | ICD-10-CM

## 2019-12-31 DIAGNOSIS — K2960 Other gastritis without bleeding: Secondary | ICD-10-CM

## 2019-12-31 DIAGNOSIS — Z1211 Encounter for screening for malignant neoplasm of colon: Secondary | ICD-10-CM | POA: Diagnosis not present

## 2019-12-31 DIAGNOSIS — K449 Diaphragmatic hernia without obstruction or gangrene: Secondary | ICD-10-CM | POA: Diagnosis not present

## 2019-12-31 DIAGNOSIS — K2950 Unspecified chronic gastritis without bleeding: Secondary | ICD-10-CM

## 2019-12-31 MED ORDER — SODIUM CHLORIDE 0.9 % IV SOLN: 500.0000 mL | Freq: Once | INTRAVENOUS | Status: DC

## 2019-12-31 NOTE — Progress Notes (Signed)
Robinul 0.1 mg IV given due large amount of secretions upon assessment.  MD made aware, vss 

## 2019-12-31 NOTE — Progress Notes (Signed)
Porta Cath patent to right upper chest wall.

## 2019-12-31 NOTE — Patient Instructions (Addendum)
There is a 6 centimeter hiatal hernia.  There is inflammation in the stomach - biopsies taken.   No colon polyps - some diverticulosis and some internal hemorrhoids + the skin tag.  I will contact you with results and recommendations.  I appreciate the opportunity to care for you. Gatha Mayer, MD, FACG    YOU HAD AN ENDOSCOPIC PROCEDURE TODAY AT [facility]:   Refer to the procedure report that was given to you for any specific questions about what was found during the examination.  If the procedure report does not answer your questions, please call your gastroenterologist to clarify.  If you requested that your care partner not be given the details of your procedure findings, then the procedure report has been included in a sealed envelope for you to review at your convenience later.  YOU SHOULD EXPECT: Some feelings of bloating in the abdomen. Passage of more gas than usual.  Walking can help get rid of the air that was put into your GI tract during the procedure and reduce the bloating. If you had a lower endoscopy (such as a colonoscopy or flexible sigmoidoscopy) you may notice spotting of blood in your stool or on the toilet paper. If you underwent a bowel prep for your procedure, you may not have a normal bowel movement for a few days.  Please Note:  You might notice some irritation and congestion in your nose or some drainage.  This is from the oxygen used during your procedure.  There is no need for concern and it should clear up in a day or so.  SYMPTOMS TO REPORT IMMEDIATELY:   Following lower endoscopy (colonoscopy or flexible sigmoidoscopy):  Excessive amounts of blood in the stool  Significant tenderness or worsening of abdominal pains  Swelling of the abdomen that is new, acute  Fever of 100F or higher   Following upper endoscopy (EGD)  Vomiting of blood or coffee ground material  New chest pain or pain under the shoulder blades  Painful or  persistently difficult swallowing  New shortness of breath  Fever of 100F or higher  Black, tarry-looking stools  For urgent or emergent issues, a gastroenterologist can be reached at any hour by calling [phone]. Do not use MyChart messaging for urgent concerns.    DIET:  We do recommend a small meal at first, but then you may proceed to your regular diet.  Drink plenty of fluids but you should avoid alcoholic beverages for 24 hours.  ACTIVITY:  You should plan to take it easy for the rest of today and you should NOT DRIVE or use heavy machinery until tomorrow (because of the sedation medicines used during the test).    FOLLOW UP: Our staff will call the number listed on your records 48-72 hours following your procedure to check on you and address any questions or concerns that you may have regarding the information given to you following your procedure. If we do not reach you, we will leave a message.  We will attempt to reach you two times.  During this call, we will ask if you have developed any symptoms of COVID 19. If you develop any symptoms (ie: fever, flu-like symptoms, shortness of breath, cough etc.) before then, please call [phone].  If you test positive for Covid 19 in the 2 weeks post procedure, please call and report this information to [name].    If any biopsies were taken you will be contacted by phone or  by letter within the next 1-3 weeks.  Please call us at [phone] if you have not heard about the biopsies in 3 weeks.    SIGNATURES/CONFIDENTIALITY: You and/or your care partner have signed paperwork which will be entered into your electronic medical record.  These signatures attest to the fact that that the information above on your After Visit Summary has been reviewed and is understood.  Full responsibility of the confidentiality of this discharge information lies with you and/or your care-partner.  Resume medications. Information given on Hiatal  Hernia,Gastritis, diverticulosis and hemorrhoids.

## 2019-12-31 NOTE — Progress Notes (Signed)
Called to room to assist during endoscopic procedure.  Patient ID and intended procedure confirmed with present staff. Received instructions for my participation in the procedure from the performing physician.  

## 2019-12-31 NOTE — Progress Notes (Signed)
Pt's states no medical or surgical changes since previsit or office visit. 

## 2019-12-31 NOTE — Op Note (Signed)
[place] Patient Name: Marissah Vandemark Procedure Date: 12/31/2019 1:29 PM MRN: [id number] Endoscopist: Gatha Mayer , MD Age: 74 Referring MD:  Date of Birth: [birth year] Gender: Female Account #: [id number] Procedure:                Colonoscopy Indications:              Screening for colorectal malignant neoplasm, Last                            colonoscopy: 2011 Medicines:                Propofol per Anesthesia, Monitored Anesthesia Care Procedure:                Pre-Anesthesia Assessment:                           - Prior to the procedure, a History and Physical                            was performed, and patient medications and                            allergies were reviewed. The patient's tolerance of                            previous anesthesia was also reviewed. The risks                            and benefits of the procedure and the sedation                            options and risks were discussed with the patient.                            All questions were answered, and informed consent                            was obtained. Prior Anticoagulants: The patient has                            taken no previous anticoagulant or antiplatelet                            agents. ASA Grade Assessment: III - A patient with                            severe systemic disease. After reviewing the risks                            and benefits, the patient was deemed in                            satisfactory condition to undergo the procedure.                           -  Prior to the procedure, a History and Physical                            was performed, and patient medications and                            allergies were reviewed. The patient's tolerance of                            previous anesthesia was also reviewed. The risks                            and benefits of the procedure and the sedation                            options and risks were  discussed with the patient.                            All questions were answered, and informed consent                            was obtained. Prior Anticoagulants: The patient has                            taken no previous anticoagulant or antiplatelet                            agents. ASA Grade Assessment: III - A patient with                            severe systemic disease. After reviewing the risks                            and benefits, the patient was deemed in                            satisfactory condition to undergo the procedure.                           After obtaining informed consent, the colonoscope                            was passed under direct vision. Throughout the                            procedure, the patient's blood pressure, pulse, and                            oxygen saturations were monitored continuously. The                            Colonoscope was introduced through the anus and  advanced to the the cecum, identified by                            appendiceal orifice and ileocecal valve. The                            colonoscopy was performed without difficulty. The                            patient tolerated the procedure well. The quality                            of the bowel preparation was good. The bowel                            preparation used was SuTab via split dose                            instruction. The ileocecal valve, appendiceal                            orifice, and rectum were photographed. Scope In: 1:50:18 PM Scope Out: 2:04:06 PM Scope Withdrawal Time: 0 hours 10 minutes 30 seconds  Total Procedure Duration: 0 hours 13 minutes 48 seconds  Findings:                 Skin tags were found on perianal exam.                           The digital rectal exam was normal.                           A few small-mouthed diverticula were found in the                            sigmoid colon.                            External and internal hemorrhoids were found.                           The exam was otherwise without abnormality on                            direct and retroflexion views. Complications:            No immediate complications. Estimated Blood Loss:     Estimated blood loss: none. Impression:               - Perianal skin tags found on perianal exam.                           - Diverticulosis in the sigmoid colon.                           - External and internal hemorrhoids.                           -  The examination was otherwise normal on direct                            and retroflexion views.                           - No specimens collected. Recommendation:           - Patient has a contact number available for                            emergencies. The signs and symptoms of potential                            delayed complications were discussed with the                            patient. Return to normal activities tomorrow.                            Written discharge instructions were provided to the                            patient.                           - No repeat colonoscopy due to current age (27                            years or older) and the absence of colonic polyps. Gatha Mayer, MD 12/31/2019 2:15:34 PM This report has been signed electronically.

## 2019-12-31 NOTE — Op Note (Signed)
[facility] Patient Name: Jean Davidson Procedure Date: 12/31/2019 1:29 PM MRN: [id number] Endoscopist: Gatha Mayer , MD Age: 74 Referring MD:  Date of Birth: [birth year] Gender: Female Account #: [id number] Procedure:                Upper GI endoscopy Indications:              Esophageal reflux symptoms that persist despite                            appropriate therapy Medicines:                Propofol per Anesthesia, Monitored Anesthesia Care Procedure:                Pre-Anesthesia Assessment:                           - Prior to the procedure, a History and Physical                            was performed, and patient medications and                            allergies were reviewed. The patient's tolerance of                            previous anesthesia was also reviewed. The risks                            and benefits of the procedure and the sedation                            options and risks were discussed with the patient.                            All questions were answered, and informed consent                            was obtained. Prior Anticoagulants: The patient has                            taken no previous anticoagulant or antiplatelet                            agents. ASA Grade Assessment: III - A patient with                            severe systemic disease. After reviewing the risks                            and benefits, the patient was deemed in                            satisfactory condition to undergo the procedure.  After obtaining informed consent, the endoscope was                            passed under direct vision. Throughout the                            procedure, the patient's blood pressure, pulse, and                            oxygen saturations were monitored continuously. The                            Endoscope was introduced through the mouth, and                            advanced to  the second part of duodenum. The upper                            GI endoscopy was accomplished without difficulty.                            The patient tolerated the procedure well. Scope In: Scope Out: Findings:                 A 6 cm hiatal hernia was present.                           Patchy inflammation characterized by adherent                            blood, white color changes, erosions and erythema                            was found in the gastric antrum. Biopsies were                            taken with a cold forceps for histology.                            Verification of patient identification for the                            specimen was done. Estimated blood loss was minimal.                           The exam was otherwise without abnormality.                           The cardia and gastric fundus were normal on                            retroflexion. Complications:            No immediate complications. Estimated Blood Loss:     Estimated blood loss was minimal. Impression:               -  6 cm hiatal hernia. 33-39 cm                           - Gastritis. Biopsied.                           - The examination was otherwise normal. Recommendation:           - Patient has a contact number available for                            emergencies. The signs and symptoms of potential                            delayed complications were discussed with the                            patient. Return to normal activities tomorrow.                            Written discharge instructions were provided to the                            patient.                           - Resume previous diet.                           - Continue present medications.                           - Await pathology results.                           - See the other procedure note for documentation of                            additional recommendations. Gatha Mayer, MD 12/31/2019 2:13:15  PM This report has been signed electronically.

## 2019-12-31 NOTE — Progress Notes (Signed)
Report given to PACU, vss 

## 2020-01-01 ENCOUNTER — Other Ambulatory Visit: Payer: Medicare HMO

## 2020-01-01 ENCOUNTER — Ambulatory Visit: Payer: Medicare HMO | Admitting: Hematology and Oncology

## 2020-01-01 ENCOUNTER — Inpatient Hospital Stay: Payer: Medicare HMO | Attending: Hematology and Oncology

## 2020-01-01 ENCOUNTER — Ambulatory Visit: Payer: Medicare HMO

## 2020-01-01 ENCOUNTER — Inpatient Hospital Stay: Payer: Medicare HMO | Admitting: Hematology and Oncology

## 2020-01-01 ENCOUNTER — Other Ambulatory Visit: Payer: Self-pay

## 2020-01-01 ENCOUNTER — Telehealth: Payer: Self-pay | Admitting: Hematology and Oncology

## 2020-01-01 ENCOUNTER — Encounter: Payer: Self-pay | Admitting: Hematology and Oncology

## 2020-01-01 ENCOUNTER — Inpatient Hospital Stay: Payer: Medicare HMO

## 2020-01-01 VITALS — BP 116/74 | HR 57 | Temp 98.9°F | Resp 20

## 2020-01-01 DIAGNOSIS — G620 Drug-induced polyneuropathy: Secondary | ICD-10-CM

## 2020-01-01 DIAGNOSIS — Z79899 Other long term (current) drug therapy: Secondary | ICD-10-CM | POA: Diagnosis not present

## 2020-01-01 DIAGNOSIS — C9002 Multiple myeloma in relapse: Secondary | ICD-10-CM

## 2020-01-01 DIAGNOSIS — D801 Nonfamilial hypogammaglobulinemia: Secondary | ICD-10-CM

## 2020-01-01 DIAGNOSIS — Z5112 Encounter for antineoplastic immunotherapy: Secondary | ICD-10-CM | POA: Diagnosis not present

## 2020-01-01 DIAGNOSIS — D469 Myelodysplastic syndrome, unspecified: Secondary | ICD-10-CM

## 2020-01-01 DIAGNOSIS — Z9484 Stem cells transplant status: Secondary | ICD-10-CM | POA: Diagnosis not present

## 2020-01-01 DIAGNOSIS — C9000 Multiple myeloma not having achieved remission: Secondary | ICD-10-CM

## 2020-01-01 DIAGNOSIS — Z7982 Long term (current) use of aspirin: Secondary | ICD-10-CM | POA: Insufficient documentation

## 2020-01-01 DIAGNOSIS — K219 Gastro-esophageal reflux disease without esophagitis: Secondary | ICD-10-CM | POA: Insufficient documentation

## 2020-01-01 DIAGNOSIS — C946 Myelodysplastic disease, not classified: Secondary | ICD-10-CM

## 2020-01-01 DIAGNOSIS — K2100 Gastro-esophageal reflux disease with esophagitis, without bleeding: Secondary | ICD-10-CM

## 2020-01-01 DIAGNOSIS — T451X5A Adverse effect of antineoplastic and immunosuppressive drugs, initial encounter: Secondary | ICD-10-CM

## 2020-01-01 DIAGNOSIS — M6289 Other specified disorders of muscle: Secondary | ICD-10-CM

## 2020-01-01 DIAGNOSIS — Z9221 Personal history of antineoplastic chemotherapy: Secondary | ICD-10-CM | POA: Insufficient documentation

## 2020-01-01 DIAGNOSIS — M8580 Other specified disorders of bone density and structure, unspecified site: Secondary | ICD-10-CM | POA: Diagnosis not present

## 2020-01-01 LAB — CBC WITH DIFFERENTIAL/PLATELET
Abs Immature Granulocytes: 0.01 10*3/uL (ref 0.00–0.07)
Basophils Absolute: 0 10*3/uL (ref 0.0–0.1)
Basophils Relative: 1 %
Eosinophils Absolute: 0 10*3/uL (ref 0.0–0.5)
Eosinophils Relative: 0 %
HCT: 37.4 % (ref 36.0–46.0)
Hemoglobin: 12 g/dL (ref 12.0–15.0)
Immature Granulocytes: 0 %
Lymphocytes Relative: 49 %
Lymphs Abs: 1.9 10*3/uL (ref 0.7–4.0)
MCH: 34.5 pg — ABNORMAL HIGH (ref 26.0–34.0)
MCHC: 32.1 g/dL (ref 30.0–36.0)
MCV: 107.5 fL — ABNORMAL HIGH (ref 80.0–100.0)
Monocytes Absolute: 0.6 10*3/uL (ref 0.1–1.0)
Monocytes Relative: 16 %
Neutro Abs: 1.4 10*3/uL — ABNORMAL LOW (ref 1.7–7.7)
Neutrophils Relative %: 34 %
Platelets: 220 10*3/uL (ref 150–400)
RBC: 3.48 MIL/uL — ABNORMAL LOW (ref 3.87–5.11)
RDW: 13.6 % (ref 11.5–15.5)
WBC: 4 10*3/uL (ref 4.0–10.5)
nRBC: 0 % (ref 0.0–0.2)

## 2020-01-01 LAB — COMPREHENSIVE METABOLIC PANEL
ALT: 11 U/L (ref 0–44)
AST: 10 U/L — ABNORMAL LOW (ref 15–41)
Albumin: 3.4 g/dL — ABNORMAL LOW (ref 3.5–5.0)
Alkaline Phosphatase: 72 U/L (ref 38–126)
Anion gap: 10 (ref 5–15)
BUN: 11 mg/dL (ref 8–23)
CO2: 24 mmol/L (ref 22–32)
Calcium: 9.1 mg/dL (ref 8.9–10.3)
Chloride: 107 mmol/L (ref 98–111)
Creatinine, Ser: 0.74 mg/dL (ref 0.44–1.00)
GFR calc Af Amer: >60 mL/min (ref >60)
GFR calc non Af Amer: >60 mL/min (ref >60)
Glucose, Bld: 113 mg/dL — ABNORMAL HIGH (ref 70–99)
Potassium: 3.7 mmol/L (ref 3.5–5.1)
Sodium: 141 mmol/L (ref 135–145)
Total Bilirubin: 0.3 mg/dL (ref 0.3–1.2)
Total Protein: 6.1 g/dL — ABNORMAL LOW (ref 6.5–8.1)

## 2020-01-01 MED ORDER — ALTEPLASE 2 MG IJ SOLR
2.0000 mg | Freq: Once | INTRAMUSCULAR | Status: DC | PRN
  Filled 2020-01-01: qty 2

## 2020-01-01 MED ORDER — DEXAMETHASONE SODIUM PHOSPHATE 10 MG/ML IJ SOLN
INTRAMUSCULAR | Status: AC
  Filled 2020-01-01: qty 1

## 2020-01-01 MED ORDER — SODIUM CHLORIDE 0.9 % IV SOLN
Freq: Once | INTRAVENOUS | Status: AC
  Filled 2020-01-01: qty 250

## 2020-01-01 MED ORDER — HEPARIN SOD (PORK) LOCK FLUSH 100 UNIT/ML IV SOLN
500.0000 [IU] | Freq: Once | INTRAVENOUS | Status: AC | PRN
  Administered 2020-01-01: 500 [IU]
  Filled 2020-01-01: qty 5

## 2020-01-01 MED ORDER — ZOLEDRONIC ACID 4 MG/100ML IV SOLN
INTRAVENOUS | Status: AC
  Filled 2020-01-01: qty 100

## 2020-01-01 MED ORDER — PROCHLORPERAZINE MALEATE 10 MG PO TABS
ORAL_TABLET | ORAL | Status: AC
  Filled 2020-01-01: qty 1

## 2020-01-01 MED ORDER — SODIUM CHLORIDE 0.9% FLUSH
10.0000 mL | INTRAVENOUS | Status: DC | PRN
  Administered 2020-01-01: 10 mL
  Filled 2020-01-01: qty 10

## 2020-01-01 MED ORDER — ACETAMINOPHEN 325 MG PO TABS
ORAL_TABLET | ORAL | Status: AC
  Filled 2020-01-01: qty 2

## 2020-01-01 MED ORDER — PROCHLORPERAZINE MALEATE 10 MG PO TABS
10.0000 mg | ORAL_TABLET | Freq: Once | ORAL | Status: AC
  Administered 2020-01-01: 10 mg via ORAL

## 2020-01-01 MED ORDER — DEXAMETHASONE SODIUM PHOSPHATE 10 MG/ML IJ SOLN
4.0000 mg | Freq: Once | INTRAMUSCULAR | Status: AC
  Administered 2020-01-01: 4 mg via INTRAVENOUS

## 2020-01-01 MED ORDER — SODIUM CHLORIDE 0.9 % IV SOLN
16.0000 mg/kg | Freq: Once | INTRAVENOUS | Status: AC
  Administered 2020-01-01: 1200 mg via INTRAVENOUS
  Filled 2020-01-01: qty 60

## 2020-01-01 MED ORDER — SODIUM CHLORIDE 0.9% FLUSH
10.0000 mL | Freq: Once | INTRAVENOUS | Status: AC
  Administered 2020-01-01: 10 mL
  Filled 2020-01-01: qty 10

## 2020-01-01 MED ORDER — ZOLEDRONIC ACID 4 MG/100ML IV SOLN
4.0000 mg | Freq: Once | INTRAVENOUS | Status: AC
  Administered 2020-01-01: 4 mg via INTRAVENOUS

## 2020-01-01 MED ORDER — HEPARIN SOD (PORK) LOCK FLUSH 100 UNIT/ML IV SOLN
250.0000 [IU] | Freq: Once | INTRAVENOUS | Status: DC | PRN
  Filled 2020-01-01: qty 5

## 2020-01-01 MED ORDER — ACETAMINOPHEN 325 MG PO TABS
650.0000 mg | ORAL_TABLET | Freq: Once | ORAL | Status: AC
  Administered 2020-01-01: 650 mg via ORAL

## 2020-01-01 NOTE — Patient Instructions (Signed)
[facility] Discharge Instructions for Patients Receiving Chemotherapy  Today you received the following immunotherapy agents:  Daratumumab & zometa for bone support.  To help prevent nausea and vomiting after your treatment, we encourage you to take your nausea medication as needed & as prescribed. If you develop nausea and vomiting that is not controlled by your nausea medication, call the clinic.   BELOW ARE SYMPTOMS THAT SHOULD BE REPORTED IMMEDIATELY:  *FEVER GREATER THAN 100.5 F  *CHILLS WITH OR WITHOUT FEVER  NAUSEA AND VOMITING THAT IS NOT CONTROLLED WITH YOUR NAUSEA MEDICATION  *UNUSUAL SHORTNESS OF BREATH  *UNUSUAL BRUISING OR BLEEDING  TENDERNESS IN MOUTH AND THROAT WITH OR WITHOUT PRESENCE OF ULCERS  *URINARY PROBLEMS  *BOWEL PROBLEMS  UNUSUAL RASH Items with * indicate a potential emergency and should be followed up as soon as possible.  Feel free to call the clinic should you have any questions or concerns. The clinic phone number is [phone].  Please show the [place] at check-in to the Emergency Department and triage nurse.  Zoledronic Acid injection (Hypercalcemia, Oncology) What is this medicine? ZOLEDRONIC ACID (ZOE le dron ik AS id) lowers the amount of calcium loss from bone. It is used to treat too much calcium in your blood from cancer. It is also used to prevent complications of cancer that has spread to the bone. This medicine may be used for other purposes; ask your health care provider or pharmacist if you have questions. COMMON BRAND NAME(S): Zometa What should I tell my health care provider before I take this medicine? They need to know if you have any of these conditions:  aspirin-sensitive asthma  cancer, especially if you are receiving medicines used to treat cancer  dental disease or wear dentures  infection  kidney disease  receiving corticosteroids like dexamethasone or prednisone  an unusual or  allergic reaction to zoledronic acid, other medicines, foods, dyes, or preservatives  pregnant or trying to get pregnant  breast-feeding How should I use this medicine? This medicine is for infusion into a vein. It is given by a health care professional in a hospital or clinic setting. Talk to your pediatrician regarding the use of this medicine in children. Special care may be needed. Overdosage: If you think you have taken too much of this medicine contact a poison control center or emergency room at once. NOTE: This medicine is only for you. Do not share this medicine with others. What if I miss a dose? It is important not to miss your dose. Call your doctor or health care professional if you are unable to keep an appointment. What may interact with this medicine?  certain antibiotics given by injection  NSAIDs, medicines for pain and inflammation, like ibuprofen or naproxen  some diuretics like bumetanide, furosemide  teriparatide  thalidomide This list may not describe all possible interactions. Give your health care provider a list of all the medicines, herbs, non-prescription drugs, or dietary supplements you use. Also tell them if you smoke, drink alcohol, or use illegal drugs. Some items may interact with your medicine. What should I watch for while using this medicine? Visit your doctor or health care professional for regular checkups. It may be some time before you see the benefit from this medicine. Do not stop taking your medicine unless your doctor tells you to. Your doctor may order blood tests or other tests to see how you are doing. Women should inform their doctor if they wish to become  pregnant or think they might be pregnant. There is a potential for serious side effects to an unborn child. Talk to your health care professional or pharmacist for more information. You should make sure that you get enough calcium and vitamin D while you are taking this medicine. Discuss  the foods you eat and the vitamins you take with your health care professional. Some people who take this medicine have severe bone, joint, and/or muscle pain. This medicine may also increase your risk for jaw problems or a broken thigh bone. Tell your doctor right away if you have severe pain in your jaw, bones, joints, or muscles. Tell your doctor if you have any pain that does not go away or that gets worse. Tell your dentist and dental surgeon that you are taking this medicine. You should not have major dental surgery while on this medicine. See your dentist to have a dental exam and fix any dental problems before starting this medicine. Take good care of your teeth while on this medicine. Make sure you see your dentist for regular follow-up appointments. What side effects may I notice from receiving this medicine? Side effects that you should report to your doctor or health care professional as soon as possible:  allergic reactions like skin rash, itching or hives, swelling of the face, lips, or tongue  anxiety, confusion, or depression  breathing problems  changes in vision  eye pain  feeling faint or lightheaded, falls  jaw pain, especially after dental work  mouth sores  muscle cramps, stiffness, or weakness  redness, blistering, peeling or loosening of the skin, including inside the mouth  trouble passing urine or change in the amount of urine Side effects that usually do not require medical attention (report to your doctor or health care professional if they continue or are bothersome):  bone, joint, or muscle pain  constipation  diarrhea  fever  hair loss  irritation at site where injected  loss of appetite  nausea, vomiting  stomach upset  trouble sleeping  trouble swallowing  weak or tired This list may not describe all possible side effects. Call your doctor for medical advice about side effects. You may report side effects to FDA at  1-800-FDA-1088. Where should I keep my medicine? This drug is given in a hospital or clinic and will not be stored at home. NOTE: This sheet is a summary. It may not cover all possible information. If you have questions about this medicine, talk to your doctor, pharmacist, or health care provider.  2020 [employer]/Gold Standard (2013-11-08 14:19:39)

## 2020-01-01 NOTE — Assessment & Plan Note (Signed)
Biopsies pending I would defer to GI service for management

## 2020-01-01 NOTE — Assessment & Plan Note (Signed)
This is stable Observe for now 

## 2020-01-01 NOTE — Assessment & Plan Note (Signed)
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

## 2020-01-01 NOTE — Assessment & Plan Note (Signed)
She desires appointment to see a physician at [facility] for possible lift procedure I will defer to her for further management

## 2020-01-01 NOTE — Progress Notes (Signed)
[place] OFFICE PROGRESS NOTE  Patient Care Team: [name], [name], MD as PCP - General (Internal Medicine) [name], MD as PCP - Cardiology (Cardiology) [name], MD as Referring Physician (Hematology and Oncology) [name], MD as Consulting Physician (Internal Medicine) [name], [name], MD as Consulting Physician (Infectious Diseases) [name], MD as Consulting Physician (Hematology and Oncology) [name], NP as Nurse Practitioner (Hematology and Oncology)  ASSESSMENT & PLAN:  Multiple myeloma [facility]) The patient appears to be in complete remission with negative MRD We will proceed with treatment as scheduled now She remains on acyclovir for antimicrobial prophylaxis She will continue aspirin therapy for DVT prophylaxis She will continue calcium and vitamin D along with Zometa  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[place]) Recent bone marrow biopsy shows she is fully engrafted with more than 98% donor cells She will continue follow-up at [facility]  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   All questions were answered. The patient knows to call the clinic with any problems, questions or concerns. The total time spent in the appointment was 25 minutes encounter with patients including review of chart and various tests results, discussions about plan of care and coordination of care plan   [name], MD 01/01/2020 11:07  AM  INTERVAL HISTORY: Please see below for problem oriented charting. She returns for treatment and follow-up She tolerated recent treatment well No recent infection, fever or chills She had EGD and colonoscopy done yesterday, biopsies pending She continues to have difficulties with pelvic floor dysfunction and has made decision to call her physician at [facility] for possible lift procedure She continues to complain of fatigue She has occasional cold sensitivity secondary to neuropathy  SUMMARY OF ONCOLOGIC HISTORY: Oncology History Overview Note  Multiple myeloma, Ig A Lambda, M spike 3.54 grams, Calcium 9.2, Creatinine 0.8, Beta 2 microglobulin 4.52, IgA 4840 mg/dL, lambda light chain 75.4, albumin 3.6, hemoglobin 9.7, platelet 115    Primary site: Multiple Myeloma   Staging method: AJCC 6th Edition   Clinical: Stage IIA signed by [name], MD on 11/07/2013  2:46 PM   Summary: Stage IIA      Multiple myeloma ([place])  10/31/2013 Bone Marrow Biopsy   Bone marrow biopsy confirmed multiple myeloma with 40% bone marrow involvement. Skeletal survey showed minimal lesions in her score with generalized demineralization   11/10/2013 - 02/13/2014 Chemotherapy   The patient is started on induction chemotherapy with weekly dexamethasone 40 mg by mouth as well as Velcade subcutaneous injection on days 1, 4, 8 and 11. On 11/21/2013, she was started on monthly Zometa.   12/23/2013 Adverse Reaction   The dose of Velcade was reduced due to thrombocytopenia.   01/28/2014 - 04/07/2014 Chemotherapy   Revlimid is added. Treatment was discontinued due to lack of response.   02/24/2014 - 04/07/2014 Chemotherapy   Due to worsening peripheral neuropathy, Velcade injection is changed to once a week. Revlimid was given 21 days on, 7 days off.   04/07/2014 - 04/10/2014 Chemotherapy   Revlimid  was discontinued due to lack of response. Chemotherapy was changed back to Velcade injection twice a week, 2 weeks on 1 week  off. Her treatment was switched to to minimum response   04/20/2014 - 06/02/2014 Chemotherapy   chemotherapy is switched to Carfilzomib, Cytoxan and dexamethasone.   04/22/2014 Procedure   she has placement of port for chemotherapy.   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 at [facility]   10/20/2014 - 03/24/2015 Chemotherapy    she received chemotherapy with Kyprolis, Revlimid and dexamethasone   10/22/2014 Procedure   She has port placement   01/19/2015 Tumor Marker   IgA lambda M spike at 0.4 g    01/20/2015 Miscellaneous   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 No cource was found and fever resolved with IV vancomycin and meropenem   09/13/2015 Bone Marrow Biopsy   Bone marrow biopsy showed no increased blasts, 3-4 % plasma cells   03/02/2016 Bone Marrow Biopsy   Bone marrow biopsy at [facility] showed normocellular (30%) bone marrow with trilineage hematopoiesis. No significant increase in blasts. No significant increase in plasma cells.   05/12/2016 Imaging   DEXA scan at [place] showed osteopenia   10/24/2016 Imaging   Skeletal survey at [facility], no new  lesions   12/07/2017 Imaging   No focal abnormality noted to suggest myeloma. Exam is stable from prior exam.   03/01/2018 Procedure   Successful 8 French right internal jugular vein power port placement with its tip at the SVC/RA junction.   03/06/2018 -  Chemotherapy   The patient had daratumumab    07/09/2018 Bone Marrow Biopsy   Bone marrow biopsy at [facility] showed residual disease at 0.004% plasma cells   07/03/2019 Bone Marrow Biopsy   A. Bone marrow, flow cytometric analysis for multiple myeloma minimal residual disease detection:   Negative. No phenotypically abnormal plasma cells at or above the limit of detection identified.     No monotypic B-cell population identified. Negative for increased blasts.   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[place])  04/06/2015 Bone Marrow Biopsy   Accession: [id number] BM biopsy showed RAEB-1   04/06/2015 Tumor Marker   Cytogenetics and FISH for MDS are within normal limits   10/06/2015 - 10/10/2015 Chemotherapy   She received conditioning chemotherapy with busulfan and melphalan   10/12/2015 Bone Marrow Transplant   She received allogenic stem cell transplant   10/19/2015 Adverse Reaction   She developed posttransplant complication with mucositis, viral infection with rhinovirus, neutropenic fever, bilateral pleural effusion and moderate pericardial effusion and CMV reactivation.   10/31/2015 Miscellaneous   She has engrafted     REVIEW OF SYSTEMS:   Constitutional:  Denies fevers, chills or abnormal weight loss Eyes: Denies blurriness of vision Ears, nose, mouth, throat, and face: Denies mucositis or sore throat Respiratory: Denies cough, dyspnea or wheezes Cardiovascular: Denies palpitation, chest discomfort or lower extremity swelling Skin: Denies abnormal skin rashes Lymphatics: Denies new lymphadenopathy or easy bruising Neurological:Denies numbness, tingling or new weaknesses Behavioral/Psych: Mood is stable, no new  changes  All other systems were reviewed with the patient and are negative.  I have reviewed the past medical history, past surgical history, social history and family history with the patient and they are unchanged from previous note.  ALLERGIES:  has No Known Allergies.  MEDICATIONS:  Current Outpatient Medications  Medication Sig Dispense Refill  . acyclovir (ZOVIRAX) 400 MG tablet TAKE 1 TABLET BY MOUTH TWICE A DAY 180 tablet 11  . aspirin 81 MG chewable tablet Chew 325 mg by mouth daily. Takes 4 tablets (325 mg daily) 21 days out of the month with chemo drug    . BAYER ASPIRIN EC LOW DOSE 81 MG EC tablet     . calcipotriene (DOVONOX) 0.005 % cream daily.     . calcium carbonate (OS-CAL - DOSED IN MG OF ELEMENTAL CALCIUM) 1250 (500 Ca) MG tablet     . calcium carbonate (TUMS - DOSED IN MG ELEMENTAL CALCIUM) 500 MG chewable tablet Chew 1 tablet by mouth daily.    . Cholecalciferol (VITAMIN D-1000 MAX ST) 1000 units tablet Take 1,000 Units by mouth daily.    . cyanocobalamin 1000 MCG tablet Take 1,000 mcg by mouth daily.    Marland Kitchen DARATUMUMAB IV Inject into the vein every 30 (thirty) days.    Marland Kitchen docusate sodium (COLACE) 100 MG capsule Take 100 mg by mouth daily as needed for mild constipation.     Marland Kitchen estradiol (ESTRACE VAGINAL) 0.1 MG/GM vaginal cream Place 1 Applicatorful vaginally at bedtime. 42.5 g 12  . loperamide (IMODIUM) 2 MG capsule Take by mouth as needed for diarrhea or loose stools. As needed only    . metoprolol tartrate (LOPRESSOR) 25 MG tablet SMARTSIG:1.5 Tablet(s) By Mouth Twice Daily    . mirtazapine (REMERON) 15 MG tablet TAKE 1 TABLET BY MOUTH EVERYDAY AT BEDTIME 90 tablet 1  . Multiple Vitamin (MULTIVITAMIN WITH MINERALS) TABS tablet Take 1 tablet by mouth daily.    . ondansetron (ZOFRAN) 8 MG tablet Take 1 tablet (8 mg total) by mouth every 8 (eight) hours as needed (Nausea or vomiting). 30 tablet 1  . pantoprazole (PROTONIX) 40 MG tablet TAKE 1 TABLET (40 MG TOTAL) BY MOUTH  2 (TWO) TIMES DAILY. TAKE 30-60 MINUTES BEFORE BREAKFAST AND DINNER 180 tablet 2  . pomalidomide (POMALYST) 2 MG capsule Take 1 capsule (2 mg total) by mouth daily. Take with water on days 1-21. Repeat every 28 days. 21 capsule 0  . prochlorperazine (COMPAZINE) 10 MG tablet Take 1 tablet (10 mg total) by mouth every 6 (six) hours as needed (Nausea or vomiting). 30 tablet 1   No current facility-administered medications for this visit.    PHYSICAL EXAMINATION: ECOG PERFORMANCE STATUS: 1 - Symptomatic but completely ambulatory  Vitals:   01/01/20 0957  BP: (!) 141/57  Pulse: (!) 57  Resp: 16  Temp: 97.8 F (36.6 C)  SpO2: 99%   Filed Weights   01/01/20 0957  Weight: 161 lb 12.8 oz (73.4 kg)    GENERAL:alert, no distress and comfortable SKIN: skin color, texture, turgor are normal, no rashes or significant lesions EYES: normal, Conjunctiva are pink and non-injected,  sclera clear OROPHARYNX:no exudate, no erythema and lips, buccal mucosa, and tongue normal  NECK: supple, thyroid normal size, non-tender, without nodularity LYMPH:  no palpable lymphadenopathy in the cervical, axillary or inguinal LUNGS: clear to auscultation and percussion with normal breathing effort HEART: regular rate & rhythm and no murmurs and no lower extremity edema ABDOMEN:abdomen soft, non-tender and normal bowel sounds Musculoskeletal:no cyanosis of digits and no clubbing  NEURO: alert & oriented x 3 with fluent speech, no focal motor/sensory deficits  LABORATORY DATA:  I have reviewed the data as listed    Component Value Date/Time   NA 141 01/01/2020 0937   NA 142 01/04/2017 0902   K 3.7 01/01/2020 0937   K 3.9 01/04/2017 0902   CL 107 01/01/2020 0937   CO2 24 01/01/2020 0937   CO2 28 01/04/2017 0902   GLUCOSE 113 (H) 01/01/2020 0937   GLUCOSE 92 01/04/2017 0902   BUN 11 01/01/2020 0937   BUN 9.5 01/04/2017 0902   CREATININE 0.74 01/01/2020 0937   CREATININE 0.80 12/18/2018 0901   CREATININE  0.7 01/04/2017 0902   CALCIUM 9.1 01/01/2020 0937   CALCIUM 9.0 01/04/2017 0902   PROT 6.1 (L) 01/01/2020 0937   PROT 6.0 (L) 01/04/2017 0902   PROT 5.8 (L) 01/04/2017 0902   ALBUMIN 3.4 (L) 01/01/2020 0937   ALBUMIN 3.4 (L) 01/04/2017 0902   AST 10 (L) 01/01/2020 0937   AST 18 12/18/2018 0901   AST 21 01/04/2017 0902   ALT 11 01/01/2020 0937   ALT 14 12/18/2018 0901   ALT 16 01/04/2017 0902   ALKPHOS 72 01/01/2020 0937   ALKPHOS 101 01/04/2017 0902   BILITOT 0.3 01/01/2020 0937   BILITOT 0.3 12/18/2018 0901   BILITOT 0.56 01/04/2017 0902   GFRNONAA >60 01/01/2020 0937   GFRNONAA >60 12/18/2018 0901   GFRAA >60 01/01/2020 0937   GFRAA >60 12/18/2018 0901    No results found for: SPEP, UPEP  Lab Results  Component Value Date   WBC 4.0 01/01/2020   NEUTROABS 1.4 (L) 01/01/2020   HGB 12.0 01/01/2020   HCT 37.4 01/01/2020   MCV 107.5 (H) 01/01/2020   PLT 220 01/01/2020      Chemistry      Component Value Date/Time   NA 141 01/01/2020 0937   NA 142 01/04/2017 0902   K 3.7 01/01/2020 0937   K 3.9 01/04/2017 0902   CL 107 01/01/2020 0937   CO2 24 01/01/2020 0937   CO2 28 01/04/2017 0902   BUN 11 01/01/2020 0937   BUN 9.5 01/04/2017 0902   CREATININE 0.74 01/01/2020 0937   CREATININE 0.80 12/18/2018 0901   CREATININE 0.7 01/04/2017 0902      Component Value Date/Time   CALCIUM 9.1 01/01/2020 0937   CALCIUM 9.0 01/04/2017 0902   ALKPHOS 72 01/01/2020 0937   ALKPHOS 101 01/04/2017 0902   AST 10 (L) 01/01/2020 0937   AST 18 12/18/2018 0901   AST 21 01/04/2017 0902   ALT 11 01/01/2020 0937   ALT 14 12/18/2018 0901   ALT 16 01/04/2017 0902   BILITOT 0.3 01/01/2020 0937   BILITOT 0.3 12/18/2018 0901   BILITOT 0.56 01/04/2017 0902

## 2020-01-01 NOTE — Assessment & Plan Note (Signed)
This is due to treatment There is no benefit for IVIG treatment

## 2020-01-01 NOTE — Telephone Encounter (Signed)
Scheduled appts per 7/8 sch msg. Pt declined print out of AVS.

## 2020-01-01 NOTE — Assessment & Plan Note (Signed)
The patient appears to be in complete remission with negative MRD We will proceed with treatment as scheduled now She remains on acyclovir for antimicrobial prophylaxis She will continue aspirin therapy for DVT prophylaxis She will continue calcium and vitamin D along with Zometa 

## 2020-01-02 ENCOUNTER — Telehealth: Payer: Self-pay

## 2020-01-02 ENCOUNTER — Encounter: Payer: Self-pay | Admitting: Hematology and Oncology

## 2020-01-02 ENCOUNTER — Other Ambulatory Visit: Payer: Self-pay | Admitting: Hematology and Oncology

## 2020-01-02 LAB — KAPPA/LAMBDA LIGHT CHAINS
Kappa free light chain: 17.3 mg/L (ref 3.3–19.4)
Kappa, lambda light chain ratio: 0.83 (ref 0.26–1.65)
Lambda free light chains: 20.9 mg/L (ref 5.7–26.3)

## 2020-01-02 NOTE — Telephone Encounter (Signed)
Called [facility] at [phone] and Faxed referral for PT to [facility] at [phone] per patient request.. Received confirmation.

## 2020-01-02 NOTE — Telephone Encounter (Signed)
  Follow up Call-  Call back number 12/31/2019  Post procedure Call Back phone  # [phone]  Permission to leave phone message Yes  Some recent data might be hidden     Patient questions:  Do you have a fever, pain , or abdominal swelling? No. Pain Score  0 *  Have you tolerated food without any problems? Yes.    Have you been able to return to your normal activities? Yes.    Do you have any questions about your discharge instructions: Diet   No. Medications  No. Follow up visit  No.  Do you have questions or concerns about your Care? No.  Actions: * If pain score is 4 or above: 1. No action needed, pain <4.Have you developed a fever since your procedure? no  2.   Have you had an respiratory symptoms (SOB or cough) since your procedure? no  3.   Have you tested positive for COVID 19 since your procedure no  4.   Have you had any family members/close contacts diagnosed with the COVID 19 since your procedure?  no   If yes to any of these questions please route to Joylene John, RN and Erenest Rasher, RN

## 2020-01-05 ENCOUNTER — Encounter: Payer: Self-pay | Admitting: Hematology and Oncology

## 2020-01-05 LAB — MULTIPLE MYELOMA PANEL, SERUM
Albumin SerPl Elph-Mcnc: 3.4 g/dL (ref 2.9–4.4)
Albumin/Glob SerPl: 1.6 (ref 0.7–1.7)
Alpha 1: 0.2 g/dL (ref 0.0–0.4)
Alpha2 Glob SerPl Elph-Mcnc: 0.7 g/dL (ref 0.4–1.0)
B-Globulin SerPl Elph-Mcnc: 0.8 g/dL (ref 0.7–1.3)
Gamma Glob SerPl Elph-Mcnc: 0.5 g/dL (ref 0.4–1.8)
Globulin, Total: 2.2 g/dL (ref 2.2–3.9)
IgA: 210 mg/dL (ref 64–422)
IgG (Immunoglobin G), Serum: 477 mg/dL — ABNORMAL LOW (ref 586–1602)
IgM (Immunoglobulin M), Srm: 43 mg/dL (ref 26–217)
Total Protein ELP: 5.6 g/dL — ABNORMAL LOW (ref 6.0–8.5)

## 2020-01-16 ENCOUNTER — Encounter: Payer: Self-pay | Admitting: Hematology and Oncology

## 2020-01-16 ENCOUNTER — Other Ambulatory Visit: Payer: Self-pay

## 2020-01-16 DIAGNOSIS — C9002 Multiple myeloma in relapse: Secondary | ICD-10-CM

## 2020-01-16 MED ORDER — POMALIDOMIDE 2 MG PO CAPS: 2.0000 mg | ORAL_CAPSULE | Freq: Every day | ORAL | 0 refills | Status: DC

## 2020-01-21 ENCOUNTER — Other Ambulatory Visit: Payer: Self-pay

## 2020-01-21 ENCOUNTER — Encounter: Payer: Self-pay | Admitting: Physician Assistant

## 2020-01-21 ENCOUNTER — Ambulatory Visit: Payer: Medicare HMO | Admitting: Physician Assistant

## 2020-01-21 VITALS — BP 142/78 | HR 71 | Ht 62.0 in | Wt 159.4 lb

## 2020-01-21 DIAGNOSIS — I471 Supraventricular tachycardia: Secondary | ICD-10-CM

## 2020-01-21 DIAGNOSIS — I10 Essential (primary) hypertension: Secondary | ICD-10-CM | POA: Diagnosis not present

## 2020-01-21 DIAGNOSIS — I313 Pericardial effusion (noninflammatory): Secondary | ICD-10-CM | POA: Diagnosis not present

## 2020-01-21 DIAGNOSIS — M79662 Pain in left lower leg: Secondary | ICD-10-CM

## 2020-01-21 DIAGNOSIS — I3139 Other pericardial effusion (noninflammatory): Secondary | ICD-10-CM

## 2020-01-21 DIAGNOSIS — C9000 Multiple myeloma not having achieved remission: Secondary | ICD-10-CM | POA: Diagnosis not present

## 2020-01-21 NOTE — Progress Notes (Signed)
Office Visit    Patient Name: Jean Davidson Date of Encounter: 01/21/2020  Primary Care Provider:  Haywood Pao, MD Primary Cardiologist:  Kathlyn Sacramento, MD  Chief Complaint    Chief Complaint  Patient presents with  . Follow-up    6 Months; medications verbally reviewed with patient   74 yo female with history of pSVT, multiple myeloma s/p bone marrow transplant in 2016 and 2020, pericardial effusion that did not require intervention, essential HTN, anemia, and cervical spine stenosis and here for follow-up.   Past Medical History    Past Medical History:  Diagnosis Date  . Anemia   . Anxiety   . Blood transfusion without reported diagnosis   . Carotid stenosis    Mild bilateral  . Carpal tunnel syndrome   . Cataract   . Cervical disc disease    c7  . Cough 07/27/2015  . Disc degeneration   . Dysuria 07/26/2015  . Failure of stem cell transplant ([place])   . Fatigue 01/26/2015  . Fever 07/27/2015  . GERD (gastroesophageal reflux disease)   . 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 Hypertension    Borderline.  . Internal and external hemorrhoids without complication   . Macular degeneration    dry  . 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 Multiple myeloma ([place])   . Osteopenia   . Pinched nerve   . Sinusitis, bacterial 07/27/2015  . Spinal stenosis in cervical region   . Varicose veins of lower extremities with inflammation    Past Surgical History:  Procedure Laterality Date  . BLADDER SUSPENSION  1988/1989   x2  . COLONOSCOPY    . ESOPHAGOGASTRODUODENOSCOPY    . IR IMAGING GUIDED PORT INSERTION  03/01/2018  . LIMBAL STEM CELL TRANSPLANT     x2  . PORT-A-CATH REMOVAL    . SHOULDER SURGERY Right 04/06/13   right  . UPPER GASTROINTESTINAL ENDOSCOPY      Allergies  No Known Allergies  History of Present Illness    Jean Davidson is a 74 y.o. female with PMH as above. She had  increased palpitations in May 2020 with Zio showing frequent and short runs SVT. Echo 01/2019 showed nl LVSF, mild pulmonary HTN, and trivial pericardial effusion. She was switched from carvedilol to metoprolol with improved sx. She continued to take Pomalyst for multiple myeloma. She had frequent episodes of L calf discomfort that occurred mostly at night and occasionally with walking. The pain would last 15-20 seconds. No previous history of DVT. On exam, her distal pulses were weak. Venous duplex and lower extremity arterial studies were ordered.  Venous duplex negative for DVT.  Arterial studies showed normal RLE and LLE with abnormal toe-brachial index but no significant arterial dz.   Today, she returns to clinic and notes significant fatigue. She was reassured by her LE [name] studies, which we reviewed today. She reports stable lower extremity sx. She has been working with PT. She reports increased stress due to recent diagnoses of bladder prolapse and hiatal hernia. She does have occasional CP that she states is due to the hiatal hernia. She is eating small meals. She is trying to remain active by walking 2 miles per day at least twice a week. She also occasionally uses a recliner bike for at least 20 minutes. She occasionally needs to stop when walking due to [place] HR with hills that resolves within  minutes of breathing slowly through her mouth. She denies CP, racing HR, or DOE when walking on flat ground.  No palpitations. She feels that her sx may be due to her comorbid conditions. She denies pnd, orthopnea, n, v, dizziness, syncope, edema, or early satiety. She reports that she was checked for sleep apnea years ago. She does note a significant amount of stress.  Home Medications    Prior to Admission medications   Medication Sig Start Date End Date Taking? Authorizing Provider  acyclovir (ZOVIRAX) 400 MG tablet TAKE 1 TABLET BY MOUTH TWICE A DAY 06/23/19  Yes [name], MD  aspirin 81 MG  chewable tablet Chew 325 mg by mouth daily. Takes 4 tablets (325 mg daily) 21 days out of the month with chemo drug   Yes [provider]  BAYER ASPIRIN EC LOW DOSE 81 MG EC tablet  11/26/19  Yes [provider]  calcipotriene (DOVONOX) 0.005 % cream daily.  05/10/18  Yes [provider]  calcium carbonate (OS-CAL - DOSED IN MG OF ELEMENTAL CALCIUM) 1250 (500 Ca) MG tablet  11/26/19  Yes [provider]  calcium carbonate (TUMS - DOSED IN MG ELEMENTAL CALCIUM) 500 MG chewable tablet Chew 1 tablet by mouth daily.   Yes [provider]  Cholecalciferol (VITAMIN D-1000 MAX ST) 1000 units tablet Take 1,000 Units by mouth daily.   Yes [provider]  cyanocobalamin 1000 MCG tablet Take 1,000 mcg by mouth daily.   Yes [provider]  DARATUMUMAB IV Inject into the vein every 30 (thirty) days.   Yes [provider]  docusate sodium (COLACE) 100 MG capsule Take 100 mg by mouth daily as needed for mild constipation.    Yes [provider]  estradiol (ESTRACE VAGINAL) 0.1 MG/GM vaginal cream Place 1 Applicatorful vaginally at bedtime. 07/09/19  Yes Gorsuch, Ni, MD  loperamide (IMODIUM) 2 MG capsule Take by mouth as needed for diarrhea or loose stools. As needed only   Yes [provider]  metoprolol tartrate (LOPRESSOR) 25 MG tablet SMARTSIG:1.5 Tablet(s) By Mouth Twice Daily 11/26/19  Yes [provider]  mirtazapine (REMERON) 15 MG tablet TAKE 1 TABLET BY MOUTH EVERYDAY AT BEDTIME 10/09/19  Yes Gorsuch, Ni, MD  Multiple Vitamin (MULTIVITAMIN WITH MINERALS) TABS tablet Take 1 tablet by mouth daily.   Yes [provider]  ondansetron (ZOFRAN) 8 MG tablet Take 1 tablet (8 mg total) by mouth every 8 (eight) hours as needed (Nausea or vomiting). 01/15/19  Yes Gorsuch, Ni, MD  pantoprazole (PROTONIX) 40 MG tablet TAKE 1 TABLET (40 MG TOTAL) BY MOUTH 2 (TWO) TIMES DAILY. TAKE 30-60 MINUTES BEFORE BREAKFAST AND DINNER  07/23/19  Yes Pyrtle, Lajuan Lines, MD  pomalidomide (POMALYST) 2 MG capsule Take 1 capsule (2 mg total) by mouth daily. Take with water on days 1-21. Repeat every 28 days. 01/16/20  Yes [name], MD  prochlorperazine (COMPAZINE) 10 MG tablet Take 1 tablet (10 mg total) by mouth every 6 (six) hours as needed (Nausea or vomiting). 02/20/18  Yes [name], MD    Review of Systems    She denies palpitations, pnd, orthopnea, n, v, dizziness, syncope, edema, weight gain, or early satiety. She reports racing HR with exertion and anxiety. She notes fatigue. She notes occasional CP due to her hiatal hernia  She has DOE, attributed to deconditioning and comorbid conditions.  All other systems reviewed and are otherwise negative except as noted above.  Physical Exam    VS:  BP (!) 142/78 (BP Location: Left Arm, Patient Position: Sitting, Cuff Size: Normal)   Pulse 71   Ht '5\' 2"'  (1.575 m)   Wt 159 lb 6.4 oz (72.3 kg)   SpO2 96%   BMI 29.15 kg/m  , BMI Body mass index is 29.15 kg/m. GEN: Well nourished, well developed, in no acute distress. HEENT: normal. Neck: Supple, no JVD, carotid bruits, or masses. Cardiac: RRR with extrasystole appreciated, 1/6 systolic murmur. No rubs, or gallops. No clubbing, cyanosis. Mild bilateral edema L>R.  Radials/DP/PT 2+ and equal bilaterally.  Respiratory:  Respirations regular and unlabored, clear to auscultation bilaterally. GI: Soft, nontender, nondistended, BS + x 4. MS: no deformity or atrophy. Skin: warm and dry, no rash. Neuro:  Strength and sensation are intact. Psych: Normal affect.  Accessory Clinical Findings    ECG personally reviewed by me today -NSR, PACs, 71 bpm, possible left atrial enlargement, when compared with prior tracing 06/10/2019, PACs have replaced previous PVCs.- no acute changes.  VITALS Reviewed today   Temp Readings from Last 3 Encounters:  01/01/20 98.9 F (37.2 C) (Oral)  01/01/20 97.8 F (36.6 C) (Temporal)  12/31/19 (!) 96.6  F (35.9 C)   BP Readings from Last 3 Encounters:  01/21/20 (!) 142/78  01/01/20 116/74  01/01/20 (!) 141/57   Pulse Readings from Last 3 Encounters:  01/21/20 71  01/01/20 (!) 57  01/01/20 (!) 57    Wt Readings from Last 3 Encounters:  01/21/20 159 lb 6.4 oz (72.3 kg)  01/01/20 161 lb 12.8 oz (73.4 kg)  12/31/19 163 lb (73.9 kg)     LABS  reviewed today   Lab Results  Component Value Date   WBC 4.0 01/01/2020   HGB 12.0 01/01/2020   HCT 37.4 01/01/2020   MCV 107.5 (H) 01/01/2020   PLT 220 01/01/2020   Lab Results  Component Value Date   CREATININE 0.74 01/01/2020   BUN 11 01/01/2020   NA 141 01/01/2020   K 3.7 01/01/2020   CL 107 01/01/2020   CO2 24 01/01/2020   Lab Results  Component Value Date   ALT 11 01/01/2020   AST 10 (L) 01/01/2020   ALKPHOS 72 01/01/2020   BILITOT 0.3 01/01/2020   Lab Results  Component Value Date   CHOL 110 08/08/2013   HDL 36.50 (L) 08/08/2013   LDLCALC 57 08/08/2013   TRIG 82.0 08/08/2013   CHOLHDL 3 08/08/2013    Lab Results  Component Value Date   HGBA1C 5.2 08/08/2013   Lab Results  Component Value Date   TSH 2.258 12/10/2018     STUDIES/PROCEDURES reviewed today   Echo 01/2019 1. The left ventricle has normal systolic function, with an ejection  fraction of 55-60%. The cavity size was normal. Left ventricular diastolic  parameters were normal.  2. The right ventricle has normal systolic function. The cavity was  normal. There is no increase in right ventricular wall thickness. Right  ventricular systolic pressure is mildly elevated with an estimated  pressure of 38.3 mmHg. RAP 10. 3. Left atrial size was mildly dilated.  4. Trivial pericardial effusion is present  LE US Venous 05/2019 Right:  No evidence of common femoral vein obstruction.  Left:  No evidence of deep vein thrombosis in the lower extremity.  No indirect evidence of obstruction proximal to the inguinal ligament.  No cystic structure  found in the popliteal fossa.   LE US Arterial  05/2019 Right: Resting right ankle-brachial index is within normal range.  No  evidence of significant right lower extremity arterial disease. The right  toe-brachial index is normal.  Left: Resting left ankle-brachial index is within normal range. No  evidence of significant left lower extremity arterial disease. The left  toe-brachial index is abnormal.  Zio 12/04/2018 Normal sinus rhythm with an average heart rate of 71 bpm. 34 episodes of short SVTs.  The longest lasted 15 beats. Rare PVCs.   Assessment & Plan    Fatigue --Reports fatigue as secondary to her stressors and hiatal hernia / bladder prolapse. She denies sleep apnea. She has atypical CP as below and attributed to GI etiology. DOE and racing HR with hills / heavy exertion but otherwise denies CP, racing HR, or DOE when walking on flat ground. New systolic murmur on exam. Most recent echo above and without significant valvular dz. Will update echo to assess EF and rule out any valvular dz, recurrent pericarditis, or acute structural changes. Continue current medications.   Atypical chest pain --Reports upper epigastric / lower sternal chest pain, attributed to hiatal hernia and GERD. CP is atypical in description and more consistent with GI etiology. She denies any CP with exertion, though does note occasional DOE and racing HR with hills but not when walking on flat ground. PACs on EKG but reportedly asymptomatic by patient. Will update echo to reassess EF and rule out structural abnormalities. Continue PPI and eating smaller meals. Continue to follow with GI.  She is agreeable to call the office or present to the ED with any new or concerning sx.  Paroxysmal SVT, PACs --NSR with PACs today. She reports she is asx with PACs. Zio as above with 34 episodes of SVT and longest lasting 15 beats. Optimization of medical therapy versus EP ablation discussed in the past. She continues on  metoprolol 37.35m BID. She reports extreme fatigue today, which she feels is due to her stressors and comorbid medical conditions rather than her metoprolol (with last increase in BB 02/2019). Continue current medications.   Hypertension --BP elevated today with home BP 130/80 per patient. Reports BP elevated to stressors. If BP elevated at RTC, additional antihypertensive medication recommended at that time. Low salt diet and fluid restriction under 2L recommended. She will monitor BP at home.  Mild pulmonary hypertension --Echo showed R sided pressure of 38.360mg with case discussed with primary cardiologist and recommendation to defer diuretic therapy given underlying multiple myeloma and concerns for AKI. Will continue to defer diuretic therapy at this time.   L Leg pain --Arterial and venous studies as above. Reduced L ABI but no evidence of significant arterial dz or DVT. Stable sx.  Multiple myeloma  --Followed by DuRob Hickman Hiatal hernia / GERD --Reports chest discomfort due to hiatal hernia, improved with smaller meals. Further management per GI.   Bladder prolapse --Working to strengthen pelvic floor. Further management per GYN.    Medication changes: None Labs ordered: None Studies / Imaging ordered: None Future considerations: Imdur Disposition: RTC 2 months   JaArvil ChacoPA-C 01/21/2020

## 2020-01-21 NOTE — Patient Instructions (Addendum)
Medication Instructions:  Your physician recommends that you continue on your current medications as directed. Please refer to the Current Medication list given to you today.  *If you need a refill on your cardiac medications before your next appointment, please call your pharmacy*   Lab Work: None ordered If you have labs (blood work) drawn today and your tests are completely normal, you will receive your results only by: Marland Kitchen MyChart Message (if you have MyChart) OR . A paper copy in the mail If you have any lab test that is abnormal or we need to change your treatment, we will call you to review the results.   Testing/Procedures: 1- Echo  Please return to [facility] on ______________ at _______________ AM/PM for an Echocardiogram. Your physician has requested that you have an echocardiogram. Echocardiography is a painless test that uses sound waves to create images of your heart. It provides your doctor with information about the size and shape of your heart and how well your heart's chambers and valves are working. This procedure takes approximately one hour. There are no restrictions for this procedure. Please note; depending on visual quality an IV may need to be placed.     Follow-Up: At [facility], you and your health needs are our priority.  As part of our continuing mission to provide you with exceptional heart care, we have created designated Provider Care Teams.  These Care Teams include your primary Cardiologist (physician) and Advanced Practice Providers (APPs -  Physician Assistants and Nurse Practitioners) who all work together to provide you with the care you need, when you need it.  We recommend signing up for the patient portal called "MyChart".  Sign up information is provided on this After Visit Summary.  MyChart is used to connect with patients for Virtual Visits (Telemedicine).  Patients are able to view lab/test results, encounter notes, upcoming  appointments, etc.  Non-urgent messages can be sent to your provider as well.   To learn more about what you can do with MyChart, go to [email or web address].    Your next appointment:   8 week(s)  The format for your next appointment:   In Person  Provider:     You may see Kathlyn Sacramento, MD or Marrianne Mood, PA-C    Other Instructions Call the clinic in 8 weeks with BP readings.  How to use a home blood pressure monitor. . Be still. Don't smoke, drink caffeinated beverages or exercise within 30 minutes before measuring your blood pressure. . Sit correctly. Sit with your back straight and supported (on a dining chair, rather than a sofa). Your feet should be flat on the floor and your legs should not be crossed. Your arm should be supported on a flat surface (such as a table) with the upper arm at heart level. Make sure the bottom of the cuff is placed directly above the bend of the elbow.  . Measure at the same time every day. It's important to take the readings at the same time each day, such as morning and evening. Take reading approximately 1 hour after BP medications.

## 2020-01-29 ENCOUNTER — Inpatient Hospital Stay: Payer: Medicare HMO

## 2020-01-29 ENCOUNTER — Encounter: Payer: Self-pay | Admitting: Hematology and Oncology

## 2020-01-29 ENCOUNTER — Inpatient Hospital Stay: Payer: Medicare HMO | Attending: Hematology and Oncology

## 2020-01-29 ENCOUNTER — Other Ambulatory Visit: Payer: Self-pay

## 2020-01-29 ENCOUNTER — Inpatient Hospital Stay: Payer: Medicare HMO | Admitting: Hematology and Oncology

## 2020-01-29 VITALS — BP 122/63 | HR 62 | Temp 98.3°F | Resp 18

## 2020-01-29 DIAGNOSIS — R0602 Shortness of breath: Secondary | ICD-10-CM | POA: Diagnosis not present

## 2020-01-29 DIAGNOSIS — C9002 Multiple myeloma in relapse: Secondary | ICD-10-CM

## 2020-01-29 DIAGNOSIS — C9000 Multiple myeloma not having achieved remission: Secondary | ICD-10-CM

## 2020-01-29 DIAGNOSIS — D469 Myelodysplastic syndrome, unspecified: Secondary | ICD-10-CM | POA: Insufficient documentation

## 2020-01-29 DIAGNOSIS — Z5112 Encounter for antineoplastic immunotherapy: Secondary | ICD-10-CM | POA: Diagnosis not present

## 2020-01-29 DIAGNOSIS — G629 Polyneuropathy, unspecified: Secondary | ICD-10-CM | POA: Diagnosis not present

## 2020-01-29 DIAGNOSIS — Z9221 Personal history of antineoplastic chemotherapy: Secondary | ICD-10-CM | POA: Insufficient documentation

## 2020-01-29 DIAGNOSIS — Z79899 Other long term (current) drug therapy: Secondary | ICD-10-CM | POA: Diagnosis not present

## 2020-01-29 DIAGNOSIS — M8580 Other specified disorders of bone density and structure, unspecified site: Secondary | ICD-10-CM | POA: Diagnosis not present

## 2020-01-29 DIAGNOSIS — M6289 Other specified disorders of muscle: Secondary | ICD-10-CM

## 2020-01-29 DIAGNOSIS — Z7982 Long term (current) use of aspirin: Secondary | ICD-10-CM | POA: Diagnosis not present

## 2020-01-29 DIAGNOSIS — D696 Thrombocytopenia, unspecified: Secondary | ICD-10-CM | POA: Insufficient documentation

## 2020-01-29 LAB — CBC WITH DIFFERENTIAL/PLATELET
Abs Immature Granulocytes: 0.01 10*3/uL (ref 0.00–0.07)
Basophils Absolute: 0.1 10*3/uL (ref 0.0–0.1)
Basophils Relative: 1 %
Eosinophils Absolute: 0 10*3/uL (ref 0.0–0.5)
Eosinophils Relative: 0 %
HCT: 38.6 % (ref 36.0–46.0)
Hemoglobin: 12.5 g/dL (ref 12.0–15.0)
Immature Granulocytes: 0 %
Lymphocytes Relative: 45 %
Lymphs Abs: 2 10*3/uL (ref 0.7–4.0)
MCH: 34.1 pg — ABNORMAL HIGH (ref 26.0–34.0)
MCHC: 32.4 g/dL (ref 30.0–36.0)
MCV: 105.2 fL — ABNORMAL HIGH (ref 80.0–100.0)
Monocytes Absolute: 0.5 10*3/uL (ref 0.1–1.0)
Monocytes Relative: 11 %
Neutro Abs: 1.9 10*3/uL (ref 1.7–7.7)
Neutrophils Relative %: 43 %
Platelets: 231 10*3/uL (ref 150–400)
RBC: 3.67 MIL/uL — ABNORMAL LOW (ref 3.87–5.11)
RDW: 13.3 % (ref 11.5–15.5)
WBC: 4.4 10*3/uL (ref 4.0–10.5)
nRBC: 0 % (ref 0.0–0.2)

## 2020-01-29 LAB — COMPREHENSIVE METABOLIC PANEL
ALT: <6 U/L (ref 0–44)
AST: 11 U/L — ABNORMAL LOW (ref 15–41)
Albumin: 3.4 g/dL — ABNORMAL LOW (ref 3.5–5.0)
Alkaline Phosphatase: 71 U/L (ref 38–126)
Anion gap: 9 (ref 5–15)
BUN: 13 mg/dL (ref 8–23)
CO2: 24 mmol/L (ref 22–32)
Calcium: 9.5 mg/dL (ref 8.9–10.3)
Chloride: 107 mmol/L (ref 98–111)
Creatinine, Ser: 0.77 mg/dL (ref 0.44–1.00)
GFR calc Af Amer: >60 mL/min (ref >60)
GFR calc non Af Amer: >60 mL/min (ref >60)
Glucose, Bld: 87 mg/dL (ref 70–99)
Potassium: 4.3 mmol/L (ref 3.5–5.1)
Sodium: 140 mmol/L (ref 135–145)
Total Bilirubin: 0.4 mg/dL (ref 0.3–1.2)
Total Protein: 6.2 g/dL — ABNORMAL LOW (ref 6.5–8.1)

## 2020-01-29 MED ORDER — HEPARIN SOD (PORK) LOCK FLUSH 100 UNIT/ML IV SOLN
500.0000 [IU] | Freq: Once | INTRAVENOUS | Status: AC | PRN
  Administered 2020-01-29: 500 [IU]
  Filled 2020-01-29: qty 5

## 2020-01-29 MED ORDER — DEXAMETHASONE SODIUM PHOSPHATE 10 MG/ML IJ SOLN
4.0000 mg | Freq: Once | INTRAMUSCULAR | Status: AC
  Administered 2020-01-29: 4 mg via INTRAVENOUS

## 2020-01-29 MED ORDER — PROCHLORPERAZINE MALEATE 10 MG PO TABS
10.0000 mg | ORAL_TABLET | Freq: Once | ORAL | Status: AC
  Administered 2020-01-29: 10 mg via ORAL

## 2020-01-29 MED ORDER — ACETAMINOPHEN 325 MG PO TABS
650.0000 mg | ORAL_TABLET | Freq: Once | ORAL | Status: AC
  Administered 2020-01-29: 650 mg via ORAL

## 2020-01-29 MED ORDER — SODIUM CHLORIDE 0.9% FLUSH
10.0000 mL | INTRAVENOUS | Status: DC | PRN
  Administered 2020-01-29: 10 mL
  Filled 2020-01-29: qty 10

## 2020-01-29 MED ORDER — ACETAMINOPHEN 325 MG PO TABS
ORAL_TABLET | ORAL | Status: AC
  Filled 2020-01-29: qty 2

## 2020-01-29 MED ORDER — SODIUM CHLORIDE 0.9 % IV SOLN
16.0000 mg/kg | Freq: Once | INTRAVENOUS | Status: AC
  Administered 2020-01-29: 1200 mg via INTRAVENOUS
  Filled 2020-01-29: qty 60

## 2020-01-29 MED ORDER — SODIUM CHLORIDE 0.9% FLUSH
10.0000 mL | Freq: Once | INTRAVENOUS | Status: AC
  Administered 2020-01-29: 10 mL
  Filled 2020-01-29: qty 10

## 2020-01-29 MED ORDER — DEXAMETHASONE SODIUM PHOSPHATE 10 MG/ML IJ SOLN
INTRAMUSCULAR | Status: AC
  Filled 2020-01-29: qty 1

## 2020-01-29 MED ORDER — PROCHLORPERAZINE MALEATE 10 MG PO TABS
ORAL_TABLET | ORAL | Status: AC
  Filled 2020-01-29: qty 1

## 2020-01-29 MED ORDER — SODIUM CHLORIDE 0.9 % IV SOLN
Freq: Once | INTRAVENOUS | Status: AC
  Filled 2020-01-29: qty 250

## 2020-01-29 NOTE — Assessment & Plan Note (Signed)
Further work-up in progress She has echocardiogram scheduled

## 2020-01-29 NOTE — Assessment & Plan Note (Signed)
She desires appointment to see a physician at [facility] for possible lift procedure I will defer to her for further management

## 2020-01-29 NOTE — Assessment & Plan Note (Signed)
The patient appears to be in complete remission with negative MRD We will proceed with treatment as scheduled now She remains on acyclovir for antimicrobial prophylaxis She will continue aspirin therapy for DVT prophylaxis She will continue calcium and vitamin D along with Zometa 

## 2020-01-29 NOTE — Assessment & Plan Note (Signed)
Recent bone marrow biopsy shows she is fully engrafted with more than 98% donor cells She will continue follow-up at [facility] 

## 2020-01-29 NOTE — Patient Instructions (Signed)
[facility] Discharge Instructions for Patients Receiving Chemotherapy  Today you received the following chemotherapy agents Daratumumab (DARZALEX).  To help prevent nausea and vomiting after your treatment, we encourage you to take your nausea medication as prescribed.   If you develop nausea and vomiting that is not controlled by your nausea medication, call the clinic.   BELOW ARE SYMPTOMS THAT SHOULD BE REPORTED IMMEDIATELY:  *FEVER GREATER THAN 100.5 F  *CHILLS WITH OR WITHOUT FEVER  NAUSEA AND VOMITING THAT IS NOT CONTROLLED WITH YOUR NAUSEA MEDICATION  *UNUSUAL SHORTNESS OF BREATH  *UNUSUAL BRUISING OR BLEEDING  TENDERNESS IN MOUTH AND THROAT WITH OR WITHOUT PRESENCE OF ULCERS  *URINARY PROBLEMS  *BOWEL PROBLEMS  UNUSUAL RASH Items with * indicate a potential emergency and should be followed up as soon as possible.  Feel free to call the clinic should you have any questions or concerns. The clinic phone number is [phone].  Please show the CHEMO ALERT CARD at check-in to the Emergency Department and triage nurse.   

## 2020-01-29 NOTE — Progress Notes (Signed)
[place] OFFICE PROGRESS NOTE  Patient Care Team: [name], [name], MD as PCP - General (Internal Medicine) [name], MD as PCP - Cardiology (Cardiology) [name], MD as Referring Physician (Hematology and Oncology) [name], MD as Consulting Physician (Internal Medicine) [name], [name], MD as Consulting Physician (Infectious Diseases) [name], MD as Consulting Physician (Hematology and Oncology) [name], NP as Nurse Practitioner (Hematology and Oncology)  ASSESSMENT & PLAN:  Multiple myeloma [facility]) The patient appears to be in complete remission with negative MRD We will proceed with treatment as scheduled now She remains on acyclovir for antimicrobial prophylaxis She will continue aspirin therapy for DVT prophylaxis She will continue calcium and vitamin D along with Zometa  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   All questions were answered. The patient knows to call the clinic with any problems, questions or concerns. The total time spent in the appointment was 20 minutes encounter with patients including review of chart and various tests results, discussions about plan of care and coordination of care plan   [name], MD 01/29/2020 10:15 AM  INTERVAL HISTORY: Please see below for problem oriented charting. She returns for further follow-up She complained of more fatigue with each cycle of treatment after her Pomalyst runs out No  recent infection, fever or chills No bone pain No infusion reaction She is awaiting appointment in September to see urogynecologist for lift procedure She had completed EGD and colonoscopy with normal findings She has echocardiogram scheduled for further work-up for shortness of breath She has occasional loose stool after she eats chocolate chip cookies  SUMMARY OF ONCOLOGIC HISTORY: Oncology History Overview Note  Multiple myeloma, Ig A Lambda, M spike 3.54 grams, Calcium 9.2, Creatinine 0.8, Beta 2 microglobulin 4.52, IgA 4840 mg/dL, lambda light chain 75.4, albumin 3.6, hemoglobin 9.7, platelet 115    Primary site: Multiple Myeloma   Staging method: AJCC 6th Edition   Clinical: Stage IIA signed by [name], MD on 11/07/2013  2:46 PM   Summary: Stage IIA      Multiple myeloma ([place])  10/31/2013 Bone Marrow Biopsy   Bone marrow biopsy confirmed multiple myeloma with 40% bone marrow involvement. Skeletal survey showed minimal lesions in her score with generalized demineralization   11/10/2013 - 02/13/2014 Chemotherapy   The patient is started on induction chemotherapy with weekly dexamethasone 40 mg by mouth as well as Velcade subcutaneous injection on days 1, 4, 8 and 11. On 11/21/2013, she was started on monthly Zometa.   12/23/2013 Adverse Reaction   The dose of Velcade was reduced due to thrombocytopenia.   01/28/2014 - 04/07/2014 Chemotherapy   Revlimid is added. Treatment was discontinued due to lack of response.   02/24/2014 - 04/07/2014 Chemotherapy   Due to worsening peripheral neuropathy, Velcade injection is changed to once a week. Revlimid was given 21 days on, 7 days off.   04/07/2014 - 04/10/2014 Chemotherapy   Revlimid was discontinued due to lack of response. Chemotherapy was changed back to Velcade injection twice a week, 2  weeks on 1 week off. Her treatment was switched to to minimum response   04/20/2014 - 06/02/2014 Chemotherapy   chemotherapy is switched to  Carfilzomib, Cytoxan and dexamethasone.   04/22/2014 Procedure   she has placement of port for chemotherapy.   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 at [facility]   10/20/2014 - 03/24/2015 Chemotherapy    she received chemotherapy with Kyprolis, Revlimid and dexamethasone   10/22/2014 Procedure   She has port placement   01/19/2015 Tumor Marker   IgA lambda M spike at 0.4 g    01/20/2015 Miscellaneous   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 No cource was found and fever resolved with IV vancomycin and meropenem   09/13/2015 Bone Marrow Biopsy   Bone marrow biopsy showed no increased blasts, 3-4 % plasma cells   03/02/2016 Bone Marrow Biopsy   Bone marrow biopsy at [facility] showed normocellular (30%) bone marrow with trilineage hematopoiesis. No significant increase in blasts. No significant increase in plasma cells.   05/12/2016 Imaging   DEXA scan at [place] showed osteopenia   10/24/2016 Imaging   Skeletal survey at [facility], no new lesions   12/07/2017 Imaging   No focal abnormality noted to suggest myeloma. Exam is stable from prior exam.   03/01/2018 Procedure    Successful 8 French right internal jugular vein power port placement with its tip at the SVC/RA junction.   03/06/2018 -  Chemotherapy   The patient had daratumumab    07/09/2018 Bone Marrow Biopsy   Bone marrow biopsy at [facility] showed residual disease at 0.004% plasma cells   07/03/2019 Bone Marrow Biopsy   A. Bone marrow, flow cytometric analysis for multiple myeloma minimal residual disease detection:   Negative. No phenotypically abnormal plasma cells at or above the limit of detection identified.     No monotypic B-cell population identified. Negative for increased blasts.   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   10/31/2015 Miscellaneous   She has engrafted     REVIEW OF SYSTEMS:   Constitutional: Denies fevers, chills or abnormal weight loss Eyes: Denies blurriness of vision Ears, nose, mouth, throat, and face:  Denies mucositis or sore throat Respiratory: Denies cough, dyspnea or wheezes Cardiovascular: Denies palpitation, chest discomfort or lower extremity swelling Gastrointestinal:  Denies nausea, heartburn or change in bowel habits Skin: Denies abnormal skin rashes Lymphatics: Denies new lymphadenopathy or easy bruising Neurological:Denies numbness, tingling or new weaknesses Behavioral/Psych: Mood is stable, no new changes  All other systems were reviewed with the patient and are  negative.  I have reviewed the past medical history, past surgical history, social history and family history with the patient and they are unchanged from previous note.  ALLERGIES:  has No Known Allergies.  MEDICATIONS:  Current Outpatient Medications  Medication Sig Dispense Refill  . acyclovir (ZOVIRAX) 400 MG tablet TAKE 1 TABLET BY MOUTH TWICE A DAY 180 tablet 11  . aspirin 81 MG chewable tablet Chew 325 mg by mouth daily. Takes 4 tablets (325 mg daily) 21 days out of the month with chemo drug    . BAYER ASPIRIN EC LOW DOSE 81 MG EC tablet     . calcipotriene (DOVONOX) 0.005 % cream daily.     . calcium carbonate (OS-CAL - DOSED IN MG OF ELEMENTAL CALCIUM) 1250 (500 Ca) MG tablet     . calcium carbonate (TUMS - DOSED IN MG ELEMENTAL CALCIUM) 500 MG chewable tablet Chew 1 tablet by mouth daily.    . Cholecalciferol (VITAMIN D-1000 MAX ST) 1000 units tablet Take 1,000 Units by mouth daily.    . cyanocobalamin 1000 MCG tablet Take 1,000 mcg by mouth daily.    Marland Kitchen DARATUMUMAB IV Inject into the vein every 30 (thirty) days.    Marland Kitchen docusate sodium (COLACE) 100 MG capsule Take 100 mg by mouth daily as needed for mild constipation.     Marland Kitchen estradiol (ESTRACE VAGINAL) 0.1 MG/GM vaginal cream Place 1 Applicatorful vaginally at bedtime. 42.5 g 12  . loperamide (IMODIUM) 2 MG capsule Take by mouth as needed for diarrhea or loose stools. As needed only    . metoprolol tartrate (LOPRESSOR) 25 MG tablet SMARTSIG:1.5 Tablet(s) By Mouth Twice Daily    . mirtazapine (REMERON) 15 MG tablet TAKE 1 TABLET BY MOUTH EVERYDAY AT BEDTIME 90 tablet 1  . Multiple Vitamin (MULTIVITAMIN WITH MINERALS) TABS tablet Take 1 tablet by mouth daily.    . ondansetron (ZOFRAN) 8 MG tablet Take 1 tablet (8 mg total) by mouth every 8 (eight) hours as needed (Nausea or vomiting). 30 tablet 1  . pantoprazole (PROTONIX) 40 MG tablet TAKE 1 TABLET (40 MG TOTAL) BY MOUTH 2 (TWO) TIMES DAILY. TAKE 30-60 MINUTES BEFORE BREAKFAST AND  DINNER 180 tablet 2  . pomalidomide (POMALYST) 2 MG capsule Take 1 capsule (2 mg total) by mouth daily. Take with water on days 1-21. Repeat every 28 days. 21 capsule 0  . prochlorperazine (COMPAZINE) 10 MG tablet Take 1 tablet (10 mg total) by mouth every 6 (six) hours as needed (Nausea or vomiting). 30 tablet 1   No current facility-administered medications for this visit.   Facility-Administered Medications Ordered in Other Visits  Medication Dose Route Frequency Provider Last Rate Last Admin  . daratumumab (DARZALEX) 1,200 mg in sodium chloride 0.9 % 440 mL chemo infusion  16 mg/kg (Treatment Plan Recorded) Intravenous Once Aarnav Steagall, MD      . heparin lock flush 100 unit/mL  500 Units Intracatheter Once PRN Alvy Bimler, Reyhan Moronta, MD      . sodium chloride flush (NS) 0.9 % injection 10 mL  10 mL Intracatheter PRN [name], MD  PHYSICAL EXAMINATION: ECOG PERFORMANCE STATUS: 1 - Symptomatic but completely ambulatory  Vitals:   01/29/20 0907  BP: (!) 116/54  Pulse: (!) 106  Resp: 16  Temp: 98.9 F (37.2 C)  SpO2: 97%   Filed Weights   01/29/20 0907  Weight: 161 lb 6.4 oz (73.2 kg)    GENERAL:alert, no distress and comfortable SKIN: skin color, texture, turgor are normal, no rashes or significant lesions EYES: normal, Conjunctiva are pink and non-injected, sclera clear OROPHARYNX:no exudate, no erythema and lips, buccal mucosa, and tongue normal  NECK: supple, thyroid normal size, non-tender, without nodularity LYMPH:  no palpable lymphadenopathy in the cervical, axillary or inguinal LUNGS: clear to auscultation and percussion with normal breathing effort HEART: regular rate & rhythm and no murmurs and no lower extremity edema ABDOMEN:abdomen soft, non-tender and normal bowel sounds Musculoskeletal:no cyanosis of digits and no clubbing  NEURO: alert & oriented x 3 with fluent speech, no focal motor/sensory deficits  LABORATORY DATA:  I have reviewed the data as listed     Component Value Date/Time   NA 140 01/29/2020 0853   NA 142 01/04/2017 0902   K 4.3 01/29/2020 0853   K 3.9 01/04/2017 0902   CL 107 01/29/2020 0853   CO2 24 01/29/2020 0853   CO2 28 01/04/2017 0902   GLUCOSE 87 01/29/2020 0853   GLUCOSE 92 01/04/2017 0902   BUN 13 01/29/2020 0853   BUN 9.5 01/04/2017 0902   CREATININE 0.77 01/29/2020 0853   CREATININE 0.80 12/18/2018 0901   CREATININE 0.7 01/04/2017 0902   CALCIUM 9.5 01/29/2020 0853   CALCIUM 9.0 01/04/2017 0902   PROT 6.2 (L) 01/29/2020 0853   PROT 6.0 (L) 01/04/2017 0902   PROT 5.8 (L) 01/04/2017 0902   ALBUMIN 3.4 (L) 01/29/2020 0853   ALBUMIN 3.4 (L) 01/04/2017 0902   AST 11 (L) 01/29/2020 0853   AST 18 12/18/2018 0901   AST 21 01/04/2017 0902   ALT <6 01/29/2020 0853   ALT 14 12/18/2018 0901   ALT 16 01/04/2017 0902   ALKPHOS 71 01/29/2020 0853   ALKPHOS 101 01/04/2017 0902   BILITOT 0.4 01/29/2020 0853   BILITOT 0.3 12/18/2018 0901   BILITOT 0.56 01/04/2017 0902   GFRNONAA >60 01/29/2020 0853   GFRNONAA >60 12/18/2018 0901   GFRAA >60 01/29/2020 0853   GFRAA >60 12/18/2018 0901    No results found for: SPEP, UPEP  Lab Results  Component Value Date   WBC 4.4 01/29/2020   NEUTROABS 1.9 01/29/2020   HGB 12.5 01/29/2020   HCT 38.6 01/29/2020   MCV 105.2 (H) 01/29/2020   PLT 231 01/29/2020      Chemistry      Component Value Date/Time   NA 140 01/29/2020 0853   NA 142 01/04/2017 0902   K 4.3 01/29/2020 0853   K 3.9 01/04/2017 0902   CL 107 01/29/2020 0853   CO2 24 01/29/2020 0853   CO2 28 01/04/2017 0902   BUN 13 01/29/2020 0853   BUN 9.5 01/04/2017 0902   CREATININE 0.77 01/29/2020 0853   CREATININE 0.80 12/18/2018 0901   CREATININE 0.7 01/04/2017 0902      Component Value Date/Time   CALCIUM 9.5 01/29/2020 0853   CALCIUM 9.0 01/04/2017 0902   ALKPHOS 71 01/29/2020 0853   ALKPHOS 101 01/04/2017 0902   AST 11 (L) 01/29/2020 0853   AST 18 12/18/2018 0901   AST 21 01/04/2017 0902   ALT <6  01/29/2020 0853   ALT 14 12/18/2018  0901   ALT 16 01/04/2017 0902   BILITOT 0.4 01/29/2020 0853   BILITOT 0.3 12/18/2018 0901   BILITOT 0.56 01/04/2017 0902

## 2020-01-30 ENCOUNTER — Other Ambulatory Visit: Payer: Self-pay | Admitting: Hematology and Oncology

## 2020-01-30 LAB — MULTIPLE MYELOMA PANEL, SERUM
Albumin SerPl Elph-Mcnc: 3.2 g/dL (ref 2.9–4.4)
Albumin/Glob SerPl: 1.3 (ref 0.7–1.7)
Alpha 1: 0.2 g/dL (ref 0.0–0.4)
Alpha2 Glob SerPl Elph-Mcnc: 0.8 g/dL (ref 0.4–1.0)
B-Globulin SerPl Elph-Mcnc: 0.9 g/dL (ref 0.7–1.3)
Gamma Glob SerPl Elph-Mcnc: 0.6 g/dL (ref 0.4–1.8)
Globulin, Total: 2.5 g/dL (ref 2.2–3.9)
IgA: 241 mg/dL (ref 64–422)
IgG (Immunoglobin G), Serum: 448 mg/dL — ABNORMAL LOW (ref 586–1602)
IgM (Immunoglobulin M), Srm: 51 mg/dL (ref 26–217)
Total Protein ELP: 5.7 g/dL — ABNORMAL LOW (ref 6.0–8.5)

## 2020-01-30 LAB — KAPPA/LAMBDA LIGHT CHAINS
Kappa free light chain: 20.1 mg/L — ABNORMAL HIGH (ref 3.3–19.4)
Kappa, lambda light chain ratio: 0.92 (ref 0.26–1.65)
Lambda free light chains: 21.8 mg/L (ref 5.7–26.3)

## 2020-02-09 ENCOUNTER — Encounter: Payer: Self-pay | Admitting: Hematology and Oncology

## 2020-02-09 ENCOUNTER — Other Ambulatory Visit: Payer: Self-pay | Admitting: Internal Medicine

## 2020-02-09 DIAGNOSIS — R197 Diarrhea, unspecified: Secondary | ICD-10-CM

## 2020-02-09 NOTE — Progress Notes (Signed)
Tissue transglutaminase

## 2020-02-11 ENCOUNTER — Other Ambulatory Visit: Payer: Medicare HMO

## 2020-02-11 DIAGNOSIS — R197 Diarrhea, unspecified: Secondary | ICD-10-CM

## 2020-02-13 ENCOUNTER — Other Ambulatory Visit: Payer: Self-pay

## 2020-02-13 ENCOUNTER — Encounter: Payer: Self-pay | Admitting: Hematology and Oncology

## 2020-02-13 DIAGNOSIS — C9002 Multiple myeloma in relapse: Secondary | ICD-10-CM

## 2020-02-13 MED ORDER — POMALIDOMIDE 2 MG PO CAPS: 2.0000 mg | ORAL_CAPSULE | Freq: Every day | ORAL | 0 refills | Status: DC

## 2020-02-17 ENCOUNTER — Other Ambulatory Visit: Payer: Medicare HMO

## 2020-02-17 LAB — ALPHA-GAL PANEL
Beef IgE: <0.1 kU/L (ref <0.35)
Class: 0
Class: 0
Class: 0
Galactose-alpha-1,3-galactose IgE: <0.1 kU/L (ref <0.10)
LAMB/MUTTON IGE: <0.1 kU/L (ref <0.35)
Pork IgE: <0.1 kU/L (ref <0.35)

## 2020-02-17 LAB — TISSUE TRANSGLUTAMINASE, IGA: (tTG) Ab, IgA: 1 U/mL

## 2020-02-18 ENCOUNTER — Ambulatory Visit (INDEPENDENT_AMBULATORY_CARE_PROVIDER_SITE_OTHER): Payer: Medicare HMO

## 2020-02-18 ENCOUNTER — Other Ambulatory Visit: Payer: Self-pay

## 2020-02-18 DIAGNOSIS — I471 Supraventricular tachycardia: Secondary | ICD-10-CM | POA: Diagnosis not present

## 2020-02-18 LAB — ECHOCARDIOGRAM COMPLETE
AR max vel: 2.31 cm2
AV Area VTI: 2.27 cm2
AV Area mean vel: 2.4 cm2
AV Mean grad: 3 mmHg
AV Peak grad: 6.7 mmHg
Ao pk vel: 1.29 m/s
Area-P 1/2: 5.79 cm2
S' Lateral: 2.4 cm
Single Plane A4C EF: 65.5 %

## 2020-02-18 MED ORDER — PERFLUTREN LIPID MICROSPHERE
1.0000 mL | INTRAVENOUS | Status: AC | PRN
  Administered 2020-02-18: 2 mL via INTRAVENOUS

## 2020-02-20 ENCOUNTER — Telehealth: Payer: Self-pay | Admitting: Cardiovascular Disease

## 2020-02-20 NOTE — Telephone Encounter (Signed)
Patient made aware of echo results with verbalized understanding. 

## 2020-02-20 NOTE — Telephone Encounter (Signed)
Patient calling to check the status of ECHO results

## 2020-02-20 NOTE — Telephone Encounter (Signed)
Spoke with the patient. Prelim reviewed the patient echo results with the patient. Advised the patient that the echo will need to be reviewed and finalized by the ordering provider Marrianne Mood, PA.  Adv the patient that once JV has the opportunity to review and give her recommendation we will call her with the finalized results. Patient verbalized understanding and voiced appreciation for the call back.

## 2020-02-20 NOTE — Telephone Encounter (Signed)
-----   Message from Arvil Chaco, PA-C sent at 02/20/2020  3:32 PM EDT ----- --Normal pump function.   --The walls of her heart are moving normally and of normal thickness. --Left atrium still mildly enlarged (4.3cm now vs. 4cm in 2020). --Right heart pressures improved - very slightly above the normal range.  --No fluid around the heart (resolved from trivial effusion in 2020). --Mildly leaky mitral valve with some mild calcification of the mitral valve.  --Mildly leaky tricuspid valve.  --Trivially leaky from the pulmonic valve.  Overall, very reassuring echo.  We will plan to monitor her valves with periodic echo as needed.

## 2020-02-26 ENCOUNTER — Other Ambulatory Visit: Payer: Self-pay | Admitting: Hematology and Oncology

## 2020-02-26 ENCOUNTER — Inpatient Hospital Stay: Payer: Medicare HMO

## 2020-02-26 ENCOUNTER — Inpatient Hospital Stay: Payer: Medicare HMO | Admitting: Hematology and Oncology

## 2020-02-26 ENCOUNTER — Inpatient Hospital Stay: Payer: Medicare HMO | Attending: Hematology and Oncology

## 2020-02-26 ENCOUNTER — Encounter: Payer: Self-pay | Admitting: Hematology and Oncology

## 2020-02-26 ENCOUNTER — Telehealth: Payer: Self-pay | Admitting: Hematology and Oncology

## 2020-02-26 ENCOUNTER — Other Ambulatory Visit: Payer: Self-pay

## 2020-02-26 VITALS — BP 109/55 | HR 60 | Temp 98.6°F | Resp 17

## 2020-02-26 DIAGNOSIS — M8580 Other specified disorders of bone density and structure, unspecified site: Secondary | ICD-10-CM | POA: Insufficient documentation

## 2020-02-26 DIAGNOSIS — C9000 Multiple myeloma not having achieved remission: Secondary | ICD-10-CM

## 2020-02-26 DIAGNOSIS — Z5112 Encounter for antineoplastic immunotherapy: Secondary | ICD-10-CM | POA: Insufficient documentation

## 2020-02-26 DIAGNOSIS — C9002 Multiple myeloma in relapse: Secondary | ICD-10-CM | POA: Insufficient documentation

## 2020-02-26 DIAGNOSIS — D469 Myelodysplastic syndrome, unspecified: Secondary | ICD-10-CM

## 2020-02-26 DIAGNOSIS — R0602 Shortness of breath: Secondary | ICD-10-CM | POA: Diagnosis not present

## 2020-02-26 DIAGNOSIS — D801 Nonfamilial hypogammaglobulinemia: Secondary | ICD-10-CM

## 2020-02-26 DIAGNOSIS — Z7982 Long term (current) use of aspirin: Secondary | ICD-10-CM | POA: Insufficient documentation

## 2020-02-26 DIAGNOSIS — Z79899 Other long term (current) drug therapy: Secondary | ICD-10-CM | POA: Insufficient documentation

## 2020-02-26 DIAGNOSIS — Z23 Encounter for immunization: Secondary | ICD-10-CM | POA: Insufficient documentation

## 2020-02-26 DIAGNOSIS — D696 Thrombocytopenia, unspecified: Secondary | ICD-10-CM | POA: Diagnosis not present

## 2020-02-26 DIAGNOSIS — C946 Myelodysplastic disease, not classified: Secondary | ICD-10-CM

## 2020-02-26 LAB — COMPREHENSIVE METABOLIC PANEL
ALT: 6 U/L (ref 0–44)
AST: 9 U/L — ABNORMAL LOW (ref 15–41)
Albumin: 3.4 g/dL — ABNORMAL LOW (ref 3.5–5.0)
Alkaline Phosphatase: 74 U/L (ref 38–126)
Anion gap: 9 (ref 5–15)
BUN: 13 mg/dL (ref 8–23)
CO2: 24 mmol/L (ref 22–32)
Calcium: 9.4 mg/dL (ref 8.9–10.3)
Chloride: 108 mmol/L (ref 98–111)
Creatinine, Ser: 0.74 mg/dL (ref 0.44–1.00)
GFR calc Af Amer: >60 mL/min (ref >60)
GFR calc non Af Amer: >60 mL/min (ref >60)
Glucose, Bld: 104 mg/dL — ABNORMAL HIGH (ref 70–99)
Potassium: 4 mmol/L (ref 3.5–5.1)
Sodium: 141 mmol/L (ref 135–145)
Total Bilirubin: 0.4 mg/dL (ref 0.3–1.2)
Total Protein: 6.1 g/dL — ABNORMAL LOW (ref 6.5–8.1)

## 2020-02-26 LAB — CBC WITH DIFFERENTIAL/PLATELET
Abs Immature Granulocytes: 0.01 10*3/uL (ref 0.00–0.07)
Basophils Absolute: 0.1 10*3/uL (ref 0.0–0.1)
Basophils Relative: 1 %
Eosinophils Absolute: 0 10*3/uL (ref 0.0–0.5)
Eosinophils Relative: 0 %
HCT: 39 % (ref 36.0–46.0)
Hemoglobin: 12.6 g/dL (ref 12.0–15.0)
Immature Granulocytes: 0 %
Lymphocytes Relative: 48 %
Lymphs Abs: 2.6 10*3/uL (ref 0.7–4.0)
MCH: 34.1 pg — ABNORMAL HIGH (ref 26.0–34.0)
MCHC: 32.3 g/dL (ref 30.0–36.0)
MCV: 105.7 fL — ABNORMAL HIGH (ref 80.0–100.0)
Monocytes Absolute: 0.7 10*3/uL (ref 0.1–1.0)
Monocytes Relative: 14 %
Neutro Abs: 2 10*3/uL (ref 1.7–7.7)
Neutrophils Relative %: 37 %
Platelets: 220 10*3/uL (ref 150–400)
RBC: 3.69 MIL/uL — ABNORMAL LOW (ref 3.87–5.11)
RDW: 13.9 % (ref 11.5–15.5)
WBC: 5.3 10*3/uL (ref 4.0–10.5)
nRBC: 0 % (ref 0.0–0.2)

## 2020-02-26 MED ORDER — SODIUM CHLORIDE 0.9% FLUSH
10.0000 mL | Freq: Once | INTRAVENOUS | Status: AC
  Administered 2020-02-26: 10 mL
  Filled 2020-02-26: qty 10

## 2020-02-26 MED ORDER — PROCHLORPERAZINE MALEATE 10 MG PO TABS
ORAL_TABLET | ORAL | Status: AC
  Filled 2020-02-26: qty 1

## 2020-02-26 MED ORDER — ACETAMINOPHEN 325 MG PO TABS
650.0000 mg | ORAL_TABLET | Freq: Once | ORAL | Status: AC
  Administered 2020-02-26: 650 mg via ORAL

## 2020-02-26 MED ORDER — ACETAMINOPHEN 325 MG PO TABS
ORAL_TABLET | ORAL | Status: AC
  Filled 2020-02-26: qty 2

## 2020-02-26 MED ORDER — PROCHLORPERAZINE MALEATE 10 MG PO TABS
10.0000 mg | ORAL_TABLET | Freq: Once | ORAL | Status: AC
  Administered 2020-02-26: 10 mg via ORAL

## 2020-02-26 MED ORDER — DEXAMETHASONE SODIUM PHOSPHATE 10 MG/ML IJ SOLN
INTRAMUSCULAR | Status: AC
  Filled 2020-02-26: qty 1

## 2020-02-26 MED ORDER — SODIUM CHLORIDE 0.9% FLUSH
10.0000 mL | INTRAVENOUS | Status: DC | PRN
  Administered 2020-02-26: 10 mL
  Filled 2020-02-26: qty 10

## 2020-02-26 MED ORDER — SODIUM CHLORIDE 0.9 % IV SOLN
16.0000 mg/kg | Freq: Once | INTRAVENOUS | Status: AC
  Administered 2020-02-26: 1200 mg via INTRAVENOUS
  Filled 2020-02-26: qty 60

## 2020-02-26 MED ORDER — DEXAMETHASONE SODIUM PHOSPHATE 10 MG/ML IJ SOLN
4.0000 mg | Freq: Once | INTRAMUSCULAR | Status: AC
  Administered 2020-02-26: 4 mg via INTRAVENOUS

## 2020-02-26 MED ORDER — HEPARIN SOD (PORK) LOCK FLUSH 100 UNIT/ML IV SOLN
500.0000 [IU] | Freq: Once | INTRAVENOUS | Status: AC | PRN
  Administered 2020-02-26: 500 [IU]
  Filled 2020-02-26: qty 5

## 2020-02-26 MED ORDER — SODIUM CHLORIDE 0.9 % IV SOLN
Freq: Once | INTRAVENOUS | Status: AC
  Filled 2020-02-26: qty 250

## 2020-02-26 NOTE — Patient Instructions (Signed)
[place] Discharge Instructions for Patients Receiving Chemotherapy  Today you received the following immunotherapy agents:  Daratumumab.  To help prevent nausea and vomiting after your treatment, we encourage you to take your nausea medication as needed & as prescribed. If you develop nausea and vomiting that is not controlled by your nausea medication, call the clinic.   BELOW ARE SYMPTOMS THAT SHOULD BE REPORTED IMMEDIATELY:  *FEVER GREATER THAN 100.5 F  *CHILLS WITH OR WITHOUT FEVER  NAUSEA AND VOMITING THAT IS NOT CONTROLLED WITH YOUR NAUSEA MEDICATION  *UNUSUAL SHORTNESS OF BREATH  *UNUSUAL BRUISING OR BLEEDING  TENDERNESS IN MOUTH AND THROAT WITH OR WITHOUT PRESENCE OF ULCERS  *URINARY PROBLEMS  *BOWEL PROBLEMS  UNUSUAL RASH Items with * indicate a potential emergency and should be followed up as soon as possible.  Feel free to call the clinic should you have any questions or concerns. The clinic phone number is [phone].  Please show the [place] at check-in to the Emergency Department and triage nurse.

## 2020-02-26 NOTE — Assessment & Plan Note (Signed)
Her recent echocardiogram showed preserved ejection fraction with no significant abnormalities She will continue exercise as tolerated

## 2020-02-26 NOTE — Assessment & Plan Note (Signed)
The patient appears to be in complete remission with negative MRD We will proceed with treatment as scheduled now She remains on acyclovir for antimicrobial prophylaxis She will continue aspirin therapy for DVT prophylaxis She will continue calcium and vitamin D along with Zometa

## 2020-02-26 NOTE — Assessment & Plan Note (Signed)
She has no recent infection She has received booster vaccine She will be due to flu shot in the next few weeks We discussed the risk of exposure and social distancing and mask wearing

## 2020-02-26 NOTE — Progress Notes (Signed)
[place] OFFICE PROGRESS NOTE  Patient Care Team: [name], [name], MD as PCP - General (Internal Medicine) [name], MD as PCP - Cardiology (Cardiology) [name], MD as Referring Physician (Hematology and Oncology) [name], MD as Consulting Physician (Internal Medicine) [name], [name], MD as Consulting Physician (Infectious Diseases) [name], MD as Consulting Physician (Hematology and Oncology) [name], NP as Nurse Practitioner (Hematology and Oncology)  ASSESSMENT & PLAN:  Multiple myeloma [facility]) The patient appears to be in complete remission with negative MRD We will proceed with treatment as scheduled now She remains on acyclovir for antimicrobial prophylaxis She will continue aspirin therapy for DVT prophylaxis She will continue calcium and vitamin D along with Zometa  Exertional shortness of breath Her recent echocardiogram showed preserved ejection fraction with no significant abnormalities She will continue exercise as tolerated  Acquired hypogammaglobulinemia She has no recent infection She has received booster vaccine She will be due to flu shot in the next few weeks We discussed the risk of exposure and social distancing and mask wearing   No orders of the defined types were placed in this encounter.   All questions were answered. The patient knows to call the clinic with any problems, questions or concerns. The total time spent in the appointment was 20 minutes encounter with patients including review of chart and various tests results, discussions about plan of care and coordination of care plan   [name], MD 02/26/2020 10:22 AM  INTERVAL HISTORY: Please see below for problem oriented charting. She returns for treatment and follow-up No recent infection, fever or chills She had received booster vaccination She has undergone extensive evaluation by GI service in regards to  her GI symptoms She is attempting to exercise but has shortness of breath on exertion She had completed recent echocardiogram Overall, her energy level is fair She is attempting to lose weight  SUMMARY OF ONCOLOGIC HISTORY: Oncology History Overview Note  Multiple myeloma, Ig A Lambda, M spike 3.54 grams, Calcium 9.2, Creatinine 0.8, Beta 2 microglobulin 4.52, IgA 4840 mg/dL, lambda light chain 75.4, albumin 3.6, hemoglobin 9.7, platelet 115    Primary site: Multiple Myeloma   Staging method: AJCC 6th Edition   Clinical: Stage IIA signed by [name], MD on 11/07/2013  2:46 PM   Summary: Stage IIA      Multiple myeloma ([place])  10/31/2013 Bone Marrow Biopsy   Bone marrow biopsy confirmed multiple myeloma with 40% bone marrow involvement. Skeletal survey showed minimal lesions in her score with generalized demineralization   11/10/2013 - 02/13/2014 Chemotherapy   The patient is started on induction chemotherapy with weekly dexamethasone 40 mg by mouth as well as Velcade subcutaneous injection on days 1, 4, 8 and 11. On 11/21/2013, she was started on monthly Zometa.   12/23/2013 Adverse Reaction   The dose of Velcade was reduced due to thrombocytopenia.   01/28/2014 - 04/07/2014 Chemotherapy   Revlimid is added. Treatment was discontinued due to lack of response.   02/24/2014 - 04/07/2014 Chemotherapy   Due to worsening peripheral neuropathy, Velcade injection is changed to once a week. Revlimid was given 21 days on, 7 days off.   04/07/2014 - 04/10/2014 Chemotherapy   Revlimid was discontinued due to lack of response. Chemotherapy was changed back to Velcade injection twice a week, 2 weeks on 1 week off. Her treatment was switched to to minimum response   04/20/2014 - 06/02/2014 Chemotherapy   chemotherapy  is switched to Carfilzomib, Cytoxan and dexamethasone.   04/22/2014 Procedure   she has placement of port for chemotherapy.   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 at [facility]   10/20/2014 - 03/24/2015 Chemotherapy    she received chemotherapy with Kyprolis, Revlimid and dexamethasone   10/22/2014 Procedure   She has port placement   01/19/2015 Tumor Marker   IgA lambda M spike at 0.4 g    01/20/2015 Miscellaneous   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 No cource was found and fever resolved with IV vancomycin and meropenem   09/13/2015 Bone Marrow Biopsy   Bone marrow biopsy showed no increased blasts, 3-4 % plasma cells   03/02/2016 Bone Marrow Biopsy   Bone marrow biopsy at [facility] showed normocellular (30%) bone marrow with trilineage hematopoiesis. No significant increase in blasts. No significant increase in plasma cells.   05/12/2016 Imaging   DEXA scan at [place] showed osteopenia   10/24/2016 Imaging   Skeletal survey at [facility], no new lesions   12/07/2017 Imaging   No focal abnormality noted to suggest myeloma. Exam is stable from prior exam.   03/01/2018 Procedure   Successful 8 French right internal jugular vein power port placement with its tip at the SVC/RA junction.   03/06/2018 -  Chemotherapy   The patient had daratumumab     07/09/2018 Bone Marrow Biopsy   Bone marrow biopsy at [facility] showed residual disease at 0.004% plasma cells   07/03/2019 Bone Marrow Biopsy   A. Bone marrow, flow cytometric analysis for multiple myeloma minimal residual disease detection:   Negative. No phenotypically abnormal plasma cells at or above the limit of detection identified.     No monotypic B-cell population identified. Negative for increased blasts.   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   10/31/2015 Miscellaneous   She has engrafted     REVIEW OF SYSTEMS:   Constitutional: Denies fevers, chills or abnormal weight loss Eyes: Denies blurriness of vision Ears, nose, mouth, throat, and face: Denies mucositis or sore throat Cardiovascular: Denies palpitation, chest discomfort or lower extremity swelling Skin: Denies abnormal skin rashes Lymphatics: Denies new  lymphadenopathy or easy bruising Neurological:Denies numbness, tingling or new weaknesses Behavioral/Psych: Mood is stable, no new changes  All other systems were reviewed with the patient and are negative.  I have reviewed the past medical history, past surgical history, social history and family history with the patient and they are unchanged from previous note.  ALLERGIES:  has No Known Allergies.  MEDICATIONS:  Current Outpatient Medications  Medication Sig Dispense  Refill  . acyclovir (ZOVIRAX) 400 MG tablet TAKE 1 TABLET BY MOUTH TWICE A DAY 180 tablet 11  . aspirin 81 MG chewable tablet Chew 325 mg by mouth daily. Takes 4 tablets (325 mg daily) 21 days out of the month with chemo drug    . BAYER ASPIRIN EC LOW DOSE 81 MG EC tablet     . calcipotriene (DOVONOX) 0.005 % cream daily.     . calcium carbonate (OS-CAL - DOSED IN MG OF ELEMENTAL CALCIUM) 1250 (500 Ca) MG tablet     . calcium carbonate (TUMS - DOSED IN MG ELEMENTAL CALCIUM) 500 MG chewable tablet Chew 1 tablet by mouth daily.    . Cholecalciferol (VITAMIN D-1000 MAX ST) 1000 units tablet Take 1,000 Units by mouth daily.    . cyanocobalamin 1000 MCG tablet Take 1,000 mcg by mouth daily.    Marland Kitchen DARATUMUMAB IV Inject into the vein every 30 (thirty) days.    Marland Kitchen docusate sodium (COLACE) 100 MG capsule Take 100 mg by mouth daily as needed for mild constipation.     Marland Kitchen estradiol (ESTRACE VAGINAL) 0.1 MG/GM vaginal cream Place 1 Applicatorful vaginally at bedtime. 42.5 g 12  . loperamide (IMODIUM) 2 MG capsule Take by mouth as needed for diarrhea or loose stools. As needed only    . metoprolol tartrate (LOPRESSOR) 25 MG tablet SMARTSIG:1.5 Tablet(s) By Mouth Twice Daily    . mirtazapine (REMERON) 15 MG tablet TAKE 1 TABLET BY MOUTH EVERYDAY AT BEDTIME 90 tablet 1  . Multiple Vitamin (MULTIVITAMIN WITH MINERALS) TABS tablet Take 1 tablet by mouth daily.    . ondansetron (ZOFRAN) 8 MG tablet Take 1 tablet (8 mg total) by mouth every 8 (eight) hours as needed (Nausea or vomiting). 30 tablet 1  . pantoprazole (PROTONIX) 40 MG tablet TAKE 1 TABLET (40 MG TOTAL) BY MOUTH 2 (TWO) TIMES DAILY. TAKE 30-60 MINUTES BEFORE BREAKFAST AND DINNER 180 tablet 2  . pomalidomide (POMALYST) 2 MG capsule Take 1 capsule (2 mg total) by mouth daily. Take with water on days 1-21. Repeat every 28 days. 21 capsule 0  . prochlorperazine (COMPAZINE) 10 MG tablet Take 1 tablet (10 mg total) by mouth every 6 (six) hours as needed (Nausea  or vomiting). 30 tablet 1   No current facility-administered medications for this visit.   Facility-Administered Medications Ordered in Other Visits  Medication Dose Route Frequency Provider Last Rate Last Admin  . heparin lock flush 100 unit/mL  500 Units Intracatheter Once PRN Alvy Bimler, Hendryx Ricke, MD      . sodium chloride flush (NS) 0.9 % injection 10 mL  10 mL Intracatheter PRN Alvy Bimler, Isaias Dowson, MD        PHYSICAL EXAMINATION: ECOG PERFORMANCE STATUS: 1 - Symptomatic but completely ambulatory  Vitals:   02/26/20 0805  BP: 123/66  Pulse: (!) 59  Resp: 17  Temp: (!) 97.1 F (36.2 C)  SpO2: 100%   Filed Weights   02/26/20 0805  Weight: 161 lb 9.6 oz (73.3 kg)    GENERAL:alert, no distress and comfortable SKIN: skin color, texture,  turgor are normal, no rashes or significant lesions EYES: normal, Conjunctiva are pink and non-injected, sclera clear OROPHARYNX:no exudate, no erythema and lips, buccal mucosa, and tongue normal  NECK: supple, thyroid normal size, non-tender, without nodularity LYMPH:  no palpable lymphadenopathy in the cervical, axillary or inguinal LUNGS: clear to auscultation and percussion with normal breathing effort HEART: regular rate & rhythm and no murmurs and no lower extremity edema ABDOMEN:abdomen soft, non-tender and normal bowel sounds Musculoskeletal:no cyanosis of digits and no clubbing  NEURO: alert & oriented x 3 with fluent speech, no focal motor/sensory deficits  LABORATORY DATA:  I have reviewed the data as listed    Component Value Date/Time   NA 141 02/26/2020 0752   NA 142 01/04/2017 0902   K 4.0 02/26/2020 0752   K 3.9 01/04/2017 0902   CL 108 02/26/2020 0752   CO2 24 02/26/2020 0752   CO2 28 01/04/2017 0902   GLUCOSE 104 (H) 02/26/2020 0752   GLUCOSE 92 01/04/2017 0902   BUN 13 02/26/2020 0752   BUN 9.5 01/04/2017 0902   CREATININE 0.74 02/26/2020 0752   CREATININE 0.80 12/18/2018 0901   CREATININE 0.7 01/04/2017 0902   CALCIUM 9.4  02/26/2020 0752   CALCIUM 9.0 01/04/2017 0902   PROT 6.1 (L) 02/26/2020 0752   PROT 6.0 (L) 01/04/2017 0902   PROT 5.8 (L) 01/04/2017 0902   ALBUMIN 3.4 (L) 02/26/2020 0752   ALBUMIN 3.4 (L) 01/04/2017 0902   AST 9 (L) 02/26/2020 0752   AST 18 12/18/2018 0901   AST 21 01/04/2017 0902   ALT 6 02/26/2020 0752   ALT 14 12/18/2018 0901   ALT 16 01/04/2017 0902   ALKPHOS 74 02/26/2020 0752   ALKPHOS 101 01/04/2017 0902   BILITOT 0.4 02/26/2020 0752   BILITOT 0.3 12/18/2018 0901   BILITOT 0.56 01/04/2017 0902   GFRNONAA >60 02/26/2020 0752   GFRNONAA >60 12/18/2018 0901   GFRAA >60 02/26/2020 0752   GFRAA >60 12/18/2018 0901    No results found for: SPEP, UPEP  Lab Results  Component Value Date   WBC 5.3 02/26/2020   NEUTROABS 2.0 02/26/2020   HGB 12.6 02/26/2020   HCT 39.0 02/26/2020   MCV 105.7 (H) 02/26/2020   PLT 220 02/26/2020      Chemistry      Component Value Date/Time   NA 141 02/26/2020 0752   NA 142 01/04/2017 0902   K 4.0 02/26/2020 0752   K 3.9 01/04/2017 0902   CL 108 02/26/2020 0752   CO2 24 02/26/2020 0752   CO2 28 01/04/2017 0902   BUN 13 02/26/2020 0752   BUN 9.5 01/04/2017 0902   CREATININE 0.74 02/26/2020 0752   CREATININE 0.80 12/18/2018 0901   CREATININE 0.7 01/04/2017 0902      Component Value Date/Time   CALCIUM 9.4 02/26/2020 0752   CALCIUM 9.0 01/04/2017 0902   ALKPHOS 74 02/26/2020 0752   ALKPHOS 101 01/04/2017 0902   AST 9 (L) 02/26/2020 0752   AST 18 12/18/2018 0901   AST 21 01/04/2017 0902   ALT 6 02/26/2020 0752   ALT 14 12/18/2018 0901   ALT 16 01/04/2017 0902   BILITOT 0.4 02/26/2020 0752   BILITOT 0.3 12/18/2018 0901   BILITOT 0.56 01/04/2017 0902       RADIOGRAPHIC STUDIES: I have personally reviewed the radiological images as listed and agreed with the findings in the report. ECHOCARDIOGRAM COMPLETE  Result Date: 02/18/2020    ECHOCARDIOGRAM REPORT   Patient Name:  Jean Davidson Date of Exam: 02/18/2020 Medical  Rec #:  [id number]      Height:       62.0 in Accession #:    [id number]     Weight:       161.4 lb Date of Birth:  09/27/1945      BSA:          1.745 m Patient Age:    24 years       BP:           116/54 mmHg Patient Gender: F              HR:           62 bpm. Exam Location:  [facility] Procedure: 2D Echo, Cardiac Doppler, Color Doppler and Intracardiac            Opacification Agent Indications:    R53.83 Fatigue; I31.3 Pericardial effusion (noninflammatory)  History:        Patient has prior history of Echocardiogram examinations, most                 recent 02/21/2019. Pericardial Disease, Arrythmias:PSVT,                 Signs/Symptoms:Fatigue; Risk Factors:Hypertension and Former                 Smoker. GERD.  Sonographer:    Pilar Jarvis RDMS, RVT, RDCS Referring Phys: [id number] Robin Searing VISSER IMPRESSIONS  1. Left ventricular ejection fraction, by estimation, is 60 to 65%. The left ventricle has normal function. The left ventricle has no regional wall motion abnormalities. Left ventricular diastolic parameters were normal.  2. Right ventricular systolic function is normal. The right ventricular size is normal. There is normal pulmonary artery systolic pressure.  3. The mitral valve is grossly normal. Mild mitral valve regurgitation.  4. The aortic valve is tricuspid. Aortic valve regurgitation is not visualized.  5. The inferior vena cava is normal in size with greater than 50% respiratory variability, suggesting right atrial pressure of 3 mmHg. FINDINGS  Left Ventricle: Left ventricular ejection fraction, by estimation, is 60 to 65%. The left ventricle has normal function. The left ventricle has no regional wall motion abnormalities. Definity contrast agent was given IV to delineate the left ventricular  endocardial borders. The left ventricular internal cavity size was normal in size. There is no left ventricular hypertrophy. Left ventricular diastolic parameters were normal. Right Ventricle: The right  ventricular size is normal. No increase in right ventricular wall thickness. Right ventricular systolic function is normal. There is normal pulmonary artery systolic pressure. The tricuspid regurgitant velocity is 2.69 m/s, and  with an assumed right atrial pressure of 3 mmHg, the estimated right ventricular systolic pressure is 98.9 mmHg. Left Atrium: Left atrial size was normal in size. Right Atrium: Right atrial size was normal in size. Pericardium: There is no evidence of pericardial effusion. Mitral Valve: The mitral valve is grossly normal. There is mild calcification of the posterior mitral valve leaflet(s). Mild mitral valve regurgitation. Tricuspid Valve: The tricuspid valve is grossly normal. Tricuspid valve regurgitation is mild. Aortic Valve: The aortic valve is tricuspid. Aortic valve regurgitation is not visualized. Aortic valve mean gradient measures 3.0 mmHg. Aortic valve peak gradient measures 6.7 mmHg. Aortic valve area, by VTI measures 2.27 cm. Pulmonic Valve: The pulmonic valve was normal in structure. Pulmonic valve regurgitation is trivial. Aorta: The aortic root is normal in size and structure. Venous: The inferior vena  cava is normal in size with greater than 50% respiratory variability, suggesting right atrial pressure of 3 mmHg. IAS/Shunts: No atrial level shunt detected by color flow Doppler.  LEFT VENTRICLE PLAX 2D LVIDd:         3.80 cm     Diastology LVIDs:         2.40 cm     LV e' lateral:   11.30 cm/s LV PW:         1.20 cm     LV E/e' lateral: 9.7 LV IVS:        1.30 cm     LV e' medial:    8.16 cm/s LVOT diam:     1.90 cm     LV E/e' medial:  13.5 LV SV:         64 LV SV Index:   37 LVOT Area:     2.84 cm  LV Volumes (MOD) LV vol d, MOD A4C: 62.4 ml LV vol s, MOD A4C: 21.5 ml LV SV MOD A4C:     62.4 ml RIGHT VENTRICLE             IVC RV Basal diam:  3.10 cm     IVC diam: 1.40 cm RV S prime:     13.80 cm/s TAPSE (M-mode): 2.2 cm LEFT ATRIUM             Index       RIGHT ATRIUM            Index LA diam:        4.30 cm 2.46 cm/m  RA Area:     15.00 cm LA Vol (A2C):   56.9 ml 32.60 ml/m RA Volume:   37.40 ml  21.43 ml/m LA Vol (A4C):   53.8 ml 30.83 ml/m LA Biplane Vol: 56.3 ml 32.26 ml/m  AORTIC VALVE AV Area (Vmax):    2.31 cm AV Area (Vmean):   2.40 cm AV Area (VTI):     2.27 cm AV Vmax:           129.00 cm/s AV Vmean:          81.800 cm/s AV VTI:            0.281 m AV Peak Grad:      6.7 mmHg AV Mean Grad:      3.0 mmHg LVOT Vmax:         105.00 cm/s LVOT Vmean:        69.300 cm/s LVOT VTI:          0.225 m LVOT/AV VTI ratio: 0.80  AORTA Ao Root diam: 3.10 cm Ao Asc diam:  3.40 cm Ao Arch diam: 2.7 cm MITRAL VALVE                TRICUSPID VALVE MV Area (PHT): 5.79 cm     TR Peak grad:   28.9 mmHg MV Decel Time: 131 msec     TR Vmax:        269.00 cm/s MV E velocity: 110.00 cm/s MV A velocity: 108.00 cm/s  SHUNTS MV E/A ratio:  1.02         Systemic VTI:  0.22 m                             Systemic Diam: 1.90 cm Kate Sable MD Electronically signed by Kate Sable MD Signature Date/Time: 02/18/2020/4:56:04 PM    Final

## 2020-02-26 NOTE — Telephone Encounter (Signed)
Scheduled appointments per 9/2 scheduling message. Patient declined calendar print out.

## 2020-02-27 LAB — KAPPA/LAMBDA LIGHT CHAINS
Kappa free light chain: 17.1 mg/L (ref 3.3–19.4)
Kappa, lambda light chain ratio: 0.76 (ref 0.26–1.65)
Lambda free light chains: 22.5 mg/L (ref 5.7–26.3)

## 2020-02-28 LAB — MULTIPLE MYELOMA PANEL, SERUM
Albumin SerPl Elph-Mcnc: 3.1 g/dL (ref 2.9–4.4)
Albumin/Glob SerPl: 1.3 (ref 0.7–1.7)
Alpha 1: 0.2 g/dL (ref 0.0–0.4)
Alpha2 Glob SerPl Elph-Mcnc: 0.9 g/dL (ref 0.4–1.0)
B-Globulin SerPl Elph-Mcnc: 0.7 g/dL (ref 0.7–1.3)
Gamma Glob SerPl Elph-Mcnc: 0.6 g/dL (ref 0.4–1.8)
Globulin, Total: 2.4 g/dL (ref 2.2–3.9)
IgA: 233 mg/dL (ref 64–422)
IgG (Immunoglobin G), Serum: 415 mg/dL — ABNORMAL LOW (ref 586–1602)
IgM (Immunoglobulin M), Srm: 48 mg/dL (ref 26–217)
Total Protein ELP: 5.5 g/dL — ABNORMAL LOW (ref 6.0–8.5)

## 2020-03-02 ENCOUNTER — Telehealth: Payer: Self-pay | Admitting: *Deleted

## 2020-03-02 NOTE — Telephone Encounter (Signed)
Per Dr.Gorsuch, called pt to make aware that her multiple myeloma is showing good response. Pt verbalized understanding

## 2020-03-12 ENCOUNTER — Encounter: Payer: Self-pay | Admitting: Hematology and Oncology

## 2020-03-12 ENCOUNTER — Other Ambulatory Visit: Payer: Self-pay | Admitting: *Deleted

## 2020-03-12 DIAGNOSIS — C9002 Multiple myeloma in relapse: Secondary | ICD-10-CM

## 2020-03-12 MED ORDER — POMALIDOMIDE 2 MG PO CAPS: 2.0000 mg | ORAL_CAPSULE | Freq: Every day | ORAL | 0 refills | Status: DC

## 2020-03-17 ENCOUNTER — Ambulatory Visit: Payer: Medicare HMO | Admitting: Physician Assistant

## 2020-03-17 ENCOUNTER — Other Ambulatory Visit: Payer: Self-pay

## 2020-03-17 ENCOUNTER — Encounter: Payer: Self-pay | Admitting: Physician Assistant

## 2020-03-17 VITALS — BP 120/60 | HR 68 | Ht 62.0 in | Wt 160.2 lb

## 2020-03-17 DIAGNOSIS — I471 Supraventricular tachycardia, unspecified: Secondary | ICD-10-CM

## 2020-03-17 DIAGNOSIS — I10 Essential (primary) hypertension: Secondary | ICD-10-CM

## 2020-03-17 DIAGNOSIS — C9000 Multiple myeloma not having achieved remission: Secondary | ICD-10-CM

## 2020-03-17 DIAGNOSIS — M79662 Pain in left lower leg: Secondary | ICD-10-CM

## 2020-03-17 NOTE — Patient Instructions (Signed)
Medication Instructions:  - Your physician recommends that you continue on your current medications as directed. Please refer to the Current Medication list given to you today.  *If you need a refill on your cardiac medications before your next appointment, please call your pharmacy*   Lab Work: - none ordered  If you have labs (blood work) drawn today and your tests are completely normal, you will receive your results only by: . MyChart Message (if you have MyChart) OR . A paper copy in the mail If you have any lab test that is abnormal or we need to change your treatment, we will call you to review the results.   Testing/Procedures: - none ordered   Follow-Up: At [facility], you and your health needs are our priority.  As part of our continuing mission to provide you with exceptional heart care, we have created designated Provider Care Teams.  These Care Teams include your primary Cardiologist (physician) and Advanced Practice Providers (APPs -  Physician Assistants and Nurse Practitioners) who all work together to provide you with the care you need, when you need it.  We recommend signing up for the patient portal called "MyChart".  Sign up information is provided on this After Visit Summary.  MyChart is used to connect with patients for Virtual Visits (Telemedicine).  Patients are able to view lab/test results, encounter notes, upcoming appointments, etc.  Non-urgent messages can be sent to your provider as well.   To learn more about what you can do with MyChart, go to [email or web address].    Your next appointment:   6 month(s)  The format for your next appointment:   In Person  Provider:   You may see Muhammad Arida, MD or one of the following Advanced Practice Providers on your designated Care Team:    Christopher Berge, NP  Ryan Dunn, PA-C  Jacquelyn Visser, PA-C  Cadence Furth, PA-C    Other Instructions n/a  

## 2020-03-17 NOTE — Progress Notes (Signed)
Office Visit    Patient Name: Jean Davidson Date of Encounter: 03/17/2020  Primary Care Provider:  Haywood Pao, MD Primary Cardiologist:  Jean Sacramento, MD  Chief Complaint    Chief Complaint  Patient presents with  . other    8 week follow up. Meds reviewed by the pt. verbally. "doing well."    74 yo female with history of pSVT, multiple myeloma s/p bone marrow transplant in 2016 and 2020, pericardial effusion that did not require intervention, essential HTN, anemia, and cervical spine stenosis and here for follow-up.   Past Medical History    Past Medical History:  Diagnosis Date  . Anemia   . Anxiety   . Blood transfusion without reported diagnosis   . Carotid stenosis    Mild bilateral  . Carpal tunnel syndrome   . Cataract   . Cervical disc disease    c7  . Cough 07/27/2015  . Disc degeneration   . Dysuria 07/26/2015  . Failure of stem cell transplant ([place])   . Fatigue 01/26/2015  . Fever 07/27/2015  . GERD (gastroesophageal reflux disease)   . 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 Hypertension    Borderline.  . Internal and external hemorrhoids without complication   . Macular degeneration    dry  . 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 Multiple myeloma ([place])   . Osteopenia   . Pinched nerve   . Sinusitis, bacterial 07/27/2015  . Spinal stenosis in cervical region   . Varicose veins of lower extremities with inflammation    Past Surgical History:  Procedure Laterality Date  . BLADDER SUSPENSION  1988/1989   x2  . COLONOSCOPY    . ESOPHAGOGASTRODUODENOSCOPY    . IR IMAGING GUIDED PORT INSERTION  03/01/2018  . LIMBAL STEM CELL TRANSPLANT     x2  . PORT-A-CATH REMOVAL    . SHOULDER SURGERY Right 04/06/13   right  . UPPER GASTROINTESTINAL ENDOSCOPY      Allergies  No Known Allergies  History of Present Illness    Jean Davidson is a 74 y.o. female with PMH as above. She  had increased palpitations in May 2020 with Zio showing frequent and short runs SVT. Echo 01/2019 showed nl LVSF, mild pulmonary HTN, and trivial pericardial effusion. She was switched from carvedilol to metoprolol with improved sx. She continued to take Pomalyst for multiple myeloma. She had frequent episodes of L calf discomfort that occurred mostly at night and occasionally with walking. The pain would last 15-20 seconds. No previous history of DVT. On exam, her distal pulses were weak. Venous duplex and lower extremity arterial studies were ordered.  Venous duplex negative for DVT.  Arterial studies showed normal RLE and LLE with abnormal toe-brachial index but no significant arterial dz.   When seen in clinic 02/20/20, she noted significant fatigue. She was reassured by her LE [name] studies and reported stable lower extremity sx. She was working with PT. She reported increased stress due to recent diagnoses of bladder prolapse and hiatal hernia. She had occasional CP, attributed to hiatal hernia. She was eating small meals and trying to remain active by walking 2 miles per day at least twice a week. She also occasionally used a recliner bike for at least 20 minutes. She occasionally needed to stop when walking due to DOE/racing HR with hills that resolved within minutes of breathing slowly through her  mouth. She felt that her sx may be due to her comorbid conditions. She had been checked for sleep apnea years ago.   Recommendation was to repeat an echo, which showed hyperdynamic pump function at 60-65%, NRWMA, mild MR/TR, trivial PR, and mild LAE.   Today, 03/17/2020, she returns to clinic and notes that she has been more fatigued lately, attributed to her 21-day medication regimen that is periodically indicated and undergone for her multiple myeloma.  She states that the fatigue has increased to the extent of falling asleep in chairs at times.  She states that this medication regimen frequently makes her feel  more fatigued and also results in changes in her blood work.  She states that with every day past completion of this regimen, she feels significantly better.  She is looking forward to feeling better in the near future.  Over the past 3 months, she notes feeling dizzy once.  She has not had recurrence since that time.  She has been working to cut back on caffeine, and she notes that she is significantly cut back on [employer] soft drink.  She notes 1 coffee in the a.m. at most. She continues to struggle with racing heart rate with exertion, even minimal activity.  She also notes DOE. She reports that she often holds her breath when active but also opens her mouth, as she is hopeful that this will deliver more oxygen over that of nasal breathing.  She continues to stay active, reporting 1 to 2 miles on the treadmill 2-3 times per week and 2 miles on the recumbent bike, 2-3 times per week.  On average, she works out 4-6 days/week.  She notes ongoing left quadricep pain and some Achilles tendon pain, which is unchanged from previous visits.  She remains reassured by her lower extremity ultrasound studies.  She reports that she has recently discovered that both urology and GI are unable to provide her with surgical intervention for her bladder prolapse and hiatal hernia.  This has, understandably, caused her a significant amount of stress.  She continues to eat small meals throughout the day and take her PPI/Protonix, as well as Tums.  She is also cut back on spicy foods.  She denies any CP or racing HR at rest.  As above, and with physical activity, she reports racing heart rate and elevated pressures. She has been monitoring her BP at home and reports home BP of 147/57, 122/63, 115/54, 123/66.  She denies pnd, orthopnea, n, v, syncope, increased edema (LEE improves with elevation and compression), weight gain, or early satiety. Echo reviewed in detail.  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  Home Medications    Prior to Admission medications   Medication Sig Start Date End Date Taking? Authorizing Provider  acyclovir (ZOVIRAX) 400 MG tablet TAKE 1 TABLET BY MOUTH TWICE A DAY 06/23/19  Yes [name], MD  aspirin 81 MG chewable tablet Chew 325 mg by mouth daily. Takes 4 tablets (325 mg daily) 21 days out of the month with chemo drug   Yes [provider]  BAYER ASPIRIN EC LOW DOSE 81 MG EC tablet  11/26/19  Yes [provider]  calcipotriene (DOVONOX) 0.005 % cream daily.  05/10/18  Yes [provider]  calcium carbonate (OS-CAL - DOSED IN MG OF ELEMENTAL CALCIUM) 1250 (500 Ca) MG tablet  11/26/19  Yes [provider]  calcium carbonate (TUMS - DOSED IN MG ELEMENTAL CALCIUM) 500 MG chewable tablet Chew 1  tablet by mouth daily.   Yes [provider]  Cholecalciferol (VITAMIN D-1000 MAX ST) 1000 units tablet Take 1,000 Units by mouth daily.   Yes [provider]  cyanocobalamin 1000 MCG tablet Take 1,000 mcg by mouth daily.   Yes [provider]  DARATUMUMAB IV Inject into the vein every 30 (thirty) days.   Yes [provider]  docusate sodium (COLACE) 100 MG capsule Take 100 mg by mouth daily as needed for mild constipation.    Yes [provider]  estradiol (ESTRACE VAGINAL) 0.1 MG/GM vaginal cream Place 1 Applicatorful vaginally at bedtime. 07/09/19  Yes Gorsuch, Ni, MD  loperamide (IMODIUM) 2 MG capsule Take by mouth as needed for diarrhea or loose stools. As needed only   Yes [provider]  metoprolol tartrate (LOPRESSOR) 25 MG tablet SMARTSIG:1.5 Tablet(s) By Mouth Twice Daily 11/26/19  Yes [provider]  mirtazapine (REMERON) 15 MG tablet TAKE 1 TABLET BY MOUTH EVERYDAY AT BEDTIME 10/09/19  Yes Gorsuch, Ni, MD  Multiple Vitamin (MULTIVITAMIN WITH MINERALS) TABS tablet Take 1 tablet by mouth daily.   Yes [provider]  ondansetron (ZOFRAN) 8 MG tablet Take 1  tablet (8 mg total) by mouth every 8 (eight) hours as needed (Nausea or vomiting). 01/15/19  Yes Gorsuch, Ni, MD  pantoprazole (PROTONIX) 40 MG tablet TAKE 1 TABLET (40 MG TOTAL) BY MOUTH 2 (TWO) TIMES DAILY. TAKE 30-60 MINUTES BEFORE BREAKFAST AND DINNER 07/23/19  Yes Pyrtle, Lajuan Lines, MD  pomalidomide (POMALYST) 2 MG capsule Take 1 capsule (2 mg total) by mouth daily. Take with water on days 1-21. Repeat every 28 days. 01/16/20  Yes [name], MD  prochlorperazine (COMPAZINE) 10 MG tablet Take 1 tablet (10 mg total) by mouth every 6 (six) hours as needed (Nausea or vomiting). 02/20/18  Yes [name], MD    Review of Systems    She notes racing heart rate with minimal activity and DOE.  She reports fatigue, attributed to her recent medical regimen.  Denies palpitations, CP, pnd, orthopnea, n, v, dizziness, syncope, edema, weight gain, or early satiety. All other systems reviewed and are otherwise negative except as noted above.  Physical Exam    VS:  BP 120/60 (BP Location: Left Arm, Patient Position: Sitting, Cuff Size: Normal)   Pulse 68   Ht '5\' 2"'  (1.575 m)   Wt 160 lb 4 oz (72.7 kg)   SpO2 97%   BMI 29.31 kg/m  , BMI Body mass index is 29.31 kg/m. GEN: Well nourished, well developed, in no acute distress. HEENT: normal. Neck: Supple, no JVD, carotid bruits, or masses. Cardiac: RRR, 1/6 systolic murmur. No rubs, or gallops. No clubbing, cyanosis. Trace to mild bilateral edema L>R.  Radials/DP/PT 2+ and equal bilaterally.  Respiratory:  Respirations regular and unlabored, clear to auscultation bilaterally. GI: Soft, nontender, nondistended, BS + x 4. MS: no deformity or atrophy. Skin: warm and dry, no rash. Neuro:  Strength and sensation are intact. Psych: Normal affect.  Accessory Clinical Findings    ECG personally reviewed by me today -NSR, resolution of previous PACs, 68 bpm, poor conduction in lead III- no acute changes.  VITALS Reviewed today   Temp Readings from Last 3  Encounters:  02/26/20 98.6 F (37 C) (Oral)  02/26/20 (!) 97.1 F (36.2 C) (Tympanic)  01/29/20 98.3 F (36.8 C) (Oral)   BP Readings from Last 3 Encounters:  03/17/20 120/60  02/26/20 (!) 109/55  02/26/20 123/66   Pulse Readings from  Last 3 Encounters:  03/17/20 68  02/26/20 60  02/26/20 (!) 59    Wt Readings from Last 3 Encounters:  03/17/20 160 lb 4 oz (72.7 kg)  02/26/20 161 lb 9.6 oz (73.3 kg)  01/29/20 161 lb 6.4 oz (73.2 kg)     LABS  reviewed today   Lab Results  Component Value Date   WBC 5.3 02/26/2020   HGB 12.6 02/26/2020   HCT 39.0 02/26/2020   MCV 105.7 (H) 02/26/2020   PLT 220 02/26/2020   Lab Results  Component Value Date   CREATININE 0.74 02/26/2020   BUN 13 02/26/2020   NA 141 02/26/2020   K 4.0 02/26/2020   CL 108 02/26/2020   CO2 24 02/26/2020   Lab Results  Component Value Date   ALT 6 02/26/2020   AST 9 (L) 02/26/2020   ALKPHOS 74 02/26/2020   BILITOT 0.4 02/26/2020   Lab Results  Component Value Date   CHOL 110 08/08/2013   HDL 36.50 (L) 08/08/2013   LDLCALC 57 08/08/2013   TRIG 82.0 08/08/2013   CHOLHDL 3 08/08/2013    Lab Results  Component Value Date   HGBA1C 5.2 08/08/2013   Lab Results  Component Value Date   TSH 2.258 12/10/2018     STUDIES/PROCEDURES reviewed today   Echo 02/18/2020 1. Left ventricular ejection fraction, by estimation, is 60 to 65%. The  left ventricle has normal function. The left ventricle has no regional  wall motion abnormalities. Left ventricular diastolic parameters were  normal.  2. Right ventricular systolic function is normal. The right ventricular  size is normal. There is normal pulmonary artery systolic pressure.  3. The mitral valve is grossly normal. Mild mitral valve regurgitation.  4. The aortic valve is tricuspid. Aortic valve regurgitation is not  visualized.  5. The inferior vena cava is normal in size with greater than 50%  respiratory variability, suggesting right  atrial pressure of 3 mmHg.   (NEXT YEAR'S CAROTIDS ORDERED AND PENDING) 08/2019 Carotids Summary:  Right Carotid: Velocities in the right ICA are consistent with a 1-39%  stenosis.  Left Carotid: Velocities in the left ICA are consistent with a 1-39%  stenosis.  Vertebrals: Bilateral vertebral arteries demonstrate antegrade flow.  Subclavians: Normal flow hemodynamics were seen in bilateral subclavian        arteries.   Echo 01/2019 1. The left ventricle has normal systolic function, with an ejection  fraction of 55-60%. The cavity size was normal. Left ventricular diastolic  parameters were normal.  2. The right ventricle has normal systolic function. The cavity was  normal. There is no increase in right ventricular wall thickness. Right  ventricular systolic pressure is mildly elevated with an estimated  pressure of 38.3 mmHg. RAP 10. 3. Left atrial size was mildly dilated.  4. Trivial pericardial effusion is present  LE US Venous 05/2019 Right:  No evidence of common femoral vein obstruction.  Left:  No evidence of deep vein thrombosis in the lower extremity.  No indirect evidence of obstruction proximal to the inguinal ligament.  No cystic structure found in the popliteal fossa.   LE US Arterial  05/2019 Right: Resting right ankle-brachial index is within normal range. No  evidence of significant right lower extremity arterial disease. The right  toe-brachial index is normal.  Left: Resting left ankle-brachial index is within normal range. No  evidence of significant left lower extremity arterial disease. The left  toe-brachial index is abnormal.  Zio 12/04/2018 Normal sinus  rhythm with an average heart rate of 71 bpm. 34 episodes of short SVTs.  The longest lasted 15 beats. Rare PVCs.   Assessment & Plan    Fatigue, chronic  --Reports ongoing fatigue, worsened secondary to her current medications to treat her myeloma as well as stressors, including  having hiatal hernia / bladder prolapse without recommendation for surgical intervention per her specialists. She denies sleep apnea. Reports ongoing DOE and racing HR with exertion, as above in HPI and unchanged from previous visits. Most recent echo above with nl pump function and no significant RWMA or valvular dysfunction and reviewed in detail. Continue current medications.   Paroxysmal SVT, PACs, improved --Improved sx. Previous Zio as above with 34 episodes of SVT and longest lasting 15 beats. Optimization of medical therapy versus EP ablation discussed at previous visits. She continues on metoprolol 37.65m BID and is tolerating this well.  No recurrent CP, as previously reported. Continue current medications.   Hypertension, improved --BP improved at 120/60 today. Ongoing low salt diet and fluid restriction under 2L recommended. She will monitor BP at home.   Mild pulmonary hypertension --Euvolemic and well compensated on exam. Most recent echo as above. No indication to start diuretic therapy at this time.   L Leg pain --Reports ongoing intermittent L leg / calf pain, attributed today to dehydration. Arterial and venous studies as above and unremarkable. Reduced L ABI but no evidence of significant arterial dz or DVT. Stable sx. Continue to monitor.   Multiple myeloma  --Followed by DRob Hickman  Hiatal hernia / GERD --Further management per GI.   Bladder prolapse --Working to strengthen pelvic floor. Further management per GYN.    Medication changes: None Labs ordered: None Studies / Imaging ordered: None Disposition: RTC 6 months or sooner if needed.    JArvil Chaco PA-C 03/17/2020

## 2020-03-25 ENCOUNTER — Inpatient Hospital Stay: Payer: Medicare HMO

## 2020-03-25 ENCOUNTER — Other Ambulatory Visit: Payer: Self-pay | Admitting: Hematology and Oncology

## 2020-03-25 ENCOUNTER — Other Ambulatory Visit: Payer: Self-pay

## 2020-03-25 ENCOUNTER — Inpatient Hospital Stay (HOSPITAL_BASED_OUTPATIENT_CLINIC_OR_DEPARTMENT_OTHER): Payer: Medicare HMO | Admitting: Hematology and Oncology

## 2020-03-25 ENCOUNTER — Encounter: Payer: Self-pay | Admitting: Hematology and Oncology

## 2020-03-25 VITALS — BP 116/49 | HR 58 | Temp 98.2°F | Resp 18

## 2020-03-25 DIAGNOSIS — C9002 Multiple myeloma in relapse: Secondary | ICD-10-CM

## 2020-03-25 DIAGNOSIS — C9000 Multiple myeloma not having achieved remission: Secondary | ICD-10-CM | POA: Diagnosis not present

## 2020-03-25 DIAGNOSIS — M6289 Other specified disorders of muscle: Secondary | ICD-10-CM | POA: Diagnosis not present

## 2020-03-25 DIAGNOSIS — D801 Nonfamilial hypogammaglobulinemia: Secondary | ICD-10-CM

## 2020-03-25 DIAGNOSIS — Z5112 Encounter for antineoplastic immunotherapy: Secondary | ICD-10-CM | POA: Diagnosis not present

## 2020-03-25 LAB — COMPREHENSIVE METABOLIC PANEL
ALT: 7 U/L (ref 0–44)
AST: 9 U/L — ABNORMAL LOW (ref 15–41)
Albumin: 3.3 g/dL — ABNORMAL LOW (ref 3.5–5.0)
Alkaline Phosphatase: 72 U/L (ref 38–126)
Anion gap: 8 (ref 5–15)
BUN: 10 mg/dL (ref 8–23)
CO2: 25 mmol/L (ref 22–32)
Calcium: 8.7 mg/dL — ABNORMAL LOW (ref 8.9–10.3)
Chloride: 108 mmol/L (ref 98–111)
Creatinine, Ser: 0.72 mg/dL (ref 0.44–1.00)
GFR calc Af Amer: >60 mL/min (ref >60)
GFR calc non Af Amer: >60 mL/min (ref >60)
Glucose, Bld: 98 mg/dL (ref 70–99)
Potassium: 4 mmol/L (ref 3.5–5.1)
Sodium: 141 mmol/L (ref 135–145)
Total Bilirubin: 0.3 mg/dL (ref 0.3–1.2)
Total Protein: 5.9 g/dL — ABNORMAL LOW (ref 6.5–8.1)

## 2020-03-25 LAB — CBC WITH DIFFERENTIAL/PLATELET
Abs Immature Granulocytes: 0 10*3/uL (ref 0.00–0.07)
Basophils Absolute: 0 10*3/uL (ref 0.0–0.1)
Basophils Relative: 1 %
Eosinophils Absolute: 0 10*3/uL (ref 0.0–0.5)
Eosinophils Relative: 0 %
HCT: 37.8 % (ref 36.0–46.0)
Hemoglobin: 12.3 g/dL (ref 12.0–15.0)
Immature Granulocytes: 0 %
Lymphocytes Relative: 43 %
Lymphs Abs: 1.8 10*3/uL (ref 0.7–4.0)
MCH: 34.6 pg — ABNORMAL HIGH (ref 26.0–34.0)
MCHC: 32.5 g/dL (ref 30.0–36.0)
MCV: 106.2 fL — ABNORMAL HIGH (ref 80.0–100.0)
Monocytes Absolute: 0.7 10*3/uL (ref 0.1–1.0)
Monocytes Relative: 18 %
Neutro Abs: 1.5 10*3/uL — ABNORMAL LOW (ref 1.7–7.7)
Neutrophils Relative %: 38 %
Platelets: 190 10*3/uL (ref 150–400)
RBC: 3.56 MIL/uL — ABNORMAL LOW (ref 3.87–5.11)
RDW: 13.8 % (ref 11.5–15.5)
WBC: 4 10*3/uL (ref 4.0–10.5)
nRBC: 0 % (ref 0.0–0.2)

## 2020-03-25 MED ORDER — SODIUM CHLORIDE 0.9 % IV SOLN
16.0000 mg/kg | Freq: Once | INTRAVENOUS | Status: AC
  Administered 2020-03-25: 1200 mg via INTRAVENOUS
  Filled 2020-03-25: qty 60

## 2020-03-25 MED ORDER — INFLUENZA VAC A&B SA ADJ QUAD 0.5 ML IM PRSY
0.5000 mL | PREFILLED_SYRINGE | Freq: Once | INTRAMUSCULAR | Status: AC
  Administered 2020-03-25: 0.5 mL via INTRAMUSCULAR

## 2020-03-25 MED ORDER — DEXAMETHASONE SODIUM PHOSPHATE 10 MG/ML IJ SOLN
INTRAMUSCULAR | Status: AC
  Filled 2020-03-25: qty 1

## 2020-03-25 MED ORDER — PROCHLORPERAZINE MALEATE 10 MG PO TABS
ORAL_TABLET | ORAL | Status: AC
  Filled 2020-03-25: qty 1

## 2020-03-25 MED ORDER — DEXAMETHASONE SODIUM PHOSPHATE 10 MG/ML IJ SOLN
4.0000 mg | Freq: Once | INTRAMUSCULAR | Status: AC
  Administered 2020-03-25: 4 mg via INTRAVENOUS

## 2020-03-25 MED ORDER — PROCHLORPERAZINE MALEATE 10 MG PO TABS
10.0000 mg | ORAL_TABLET | Freq: Once | ORAL | Status: AC
  Administered 2020-03-25: 10 mg via ORAL

## 2020-03-25 MED ORDER — ACETAMINOPHEN 325 MG PO TABS
ORAL_TABLET | ORAL | Status: AC
  Filled 2020-03-25: qty 2

## 2020-03-25 MED ORDER — INFLUENZA VAC A&B SA ADJ QUAD 0.5 ML IM PRSY
PREFILLED_SYRINGE | INTRAMUSCULAR | Status: AC
  Filled 2020-03-25: qty 0.5

## 2020-03-25 MED ORDER — SODIUM CHLORIDE 0.9% FLUSH
10.0000 mL | INTRAVENOUS | Status: DC | PRN
  Administered 2020-03-25: 10 mL
  Filled 2020-03-25: qty 10

## 2020-03-25 MED ORDER — ACETAMINOPHEN 325 MG PO TABS
650.0000 mg | ORAL_TABLET | Freq: Once | ORAL | Status: AC
  Administered 2020-03-25: 650 mg via ORAL

## 2020-03-25 MED ORDER — HEPARIN SOD (PORK) LOCK FLUSH 100 UNIT/ML IV SOLN
500.0000 [IU] | Freq: Once | INTRAVENOUS | Status: AC | PRN
  Administered 2020-03-25: 500 [IU]
  Filled 2020-03-25: qty 5

## 2020-03-25 MED ORDER — SODIUM CHLORIDE 0.9 % IV SOLN
Freq: Once | INTRAVENOUS | Status: AC
  Filled 2020-03-25: qty 250

## 2020-03-25 NOTE — Assessment & Plan Note (Signed)
She was evaluated by GYN specialist who felt that surgery or pessary is not indicated She has completed pelvic floor exercise Observe only for now

## 2020-03-25 NOTE — Assessment & Plan Note (Signed)
She has no recent infection She has received booster vaccine and is due for influenza vaccination We discussed the importance of preventive care and reviewed the vaccination programs. She does not have any prior allergic reactions to influenza vaccination. She agrees to proceed with influenza vaccination today and we will administer it today at the clinic.  We discussed the risk of exposure and social distancing and mask wearing

## 2020-03-25 NOTE — Patient Instructions (Signed)
[facility] Discharge Instructions for Patients Receiving Chemotherapy  Today you received the following chemotherapy agents: daratumumab.  To help prevent nausea and vomiting after your treatment, we encourage you to take your nausea medication as directed.   If you develop nausea and vomiting that is not controlled by your nausea medication, call the clinic.   BELOW ARE SYMPTOMS THAT SHOULD BE REPORTED IMMEDIATELY:  *FEVER GREATER THAN 100.5 F  *CHILLS WITH OR WITHOUT FEVER  NAUSEA AND VOMITING THAT IS NOT CONTROLLED WITH YOUR NAUSEA MEDICATION  *UNUSUAL SHORTNESS OF BREATH  *UNUSUAL BRUISING OR BLEEDING  TENDERNESS IN MOUTH AND THROAT WITH OR WITHOUT PRESENCE OF ULCERS  *URINARY PROBLEMS  *BOWEL PROBLEMS  UNUSUAL RASH Items with * indicate a potential emergency and should be followed up as soon as possible.  Feel free to call the clinic should you have any questions or concerns. The clinic phone number is [phone].  Please show the CHEMO ALERT CARD at check-in to the Emergency Department and triage nurse.   

## 2020-03-25 NOTE — Progress Notes (Signed)
[place] OFFICE PROGRESS NOTE  Patient Care Team: [name], [name], MD as PCP - General (Internal Medicine) [name], MD as PCP - Cardiology (Cardiology) [name], MD as Referring Physician (Hematology and Oncology) [name], MD as Consulting Physician (Internal Medicine) [name], [name], MD as Consulting Physician (Infectious Diseases) [name], MD as Consulting Physician (Hematology and Oncology) [name], NP as Nurse Practitioner (Hematology and Oncology)  ASSESSMENT & PLAN:  Multiple myeloma [facility]) The patient appears to be in complete remission with negative MRD We will proceed with treatment as scheduled now She remains on acyclovir for antimicrobial prophylaxis She will continue aspirin therapy for DVT prophylaxis She will continue calcium and vitamin D She has received long-term Zometa She is at risk of osteonecrosis of the jaw I would discontinue Zometa permanently  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 She does not have any prior allergic reactions to influenza vaccination. She agrees to proceed with influenza vaccination today and we will administer it today at the clinic.  We discussed the risk of exposure and social distancing and mask wearing  Pelvic floor dysfunction in female She was evaluated by GYN specialist who felt that surgery or pessary is not indicated She has completed pelvic floor exercise Observe only for now   No orders of the defined types were placed in this encounter.   All questions were answered. The patient knows to call the clinic with any problems, questions or concerns. The total time spent in the appointment was 25 minutes encounter with patients including review of chart and various  tests results, discussions about plan of care and coordination of care plan   [name], MD 03/25/2020 11:15 AM  INTERVAL HISTORY: Please see below for problem oriented charting. She returns for treatment and follow-up She has some dental issues and has appointment to see dentist soon She was seen by GYN surgeon who felt that surgery or pessary insertion is not indicated Her recent echocardiogram was normal She is up-to-date with her Covid vaccination and is due for influenza vaccination No recent infection, fever or chills  SUMMARY OF ONCOLOGIC HISTORY: Oncology History Overview Note  Multiple myeloma, Ig A Lambda, M spike 3.54 grams, Calcium 9.2, Creatinine 0.8, Beta 2 microglobulin 4.52, IgA 4840 mg/dL, lambda light chain 75.4, albumin 3.6, hemoglobin 9.7, platelet 115    Primary site: Multiple Myeloma   Staging method: AJCC 6th Edition   Clinical: Stage IIA signed by [name], MD on 11/07/2013  2:46 PM   Summary: Stage IIA      Multiple myeloma ([place])  10/31/2013 Bone Marrow Biopsy   Bone marrow biopsy confirmed multiple myeloma with 40% bone marrow involvement. Skeletal survey showed minimal lesions in her score with generalized demineralization   11/10/2013 - 02/13/2014 Chemotherapy   The patient is started on induction chemotherapy with weekly dexamethasone 40 mg by mouth as well as Velcade subcutaneous injection on days 1, 4, 8 and 11. On 11/21/2013, she was started on monthly Zometa.   12/23/2013 Adverse Reaction   The dose of Velcade was reduced due to thrombocytopenia.   01/28/2014 - 04/07/2014 Chemotherapy   Revlimid is added. Treatment was discontinued due to lack of response.   02/24/2014 - 04/07/2014 Chemotherapy   Due to worsening peripheral neuropathy, Velcade injection is changed to once a week.  Revlimid was given 21 days on, 7 days off.   04/07/2014 - 04/10/2014 Chemotherapy   Revlimid was discontinued due to lack of response. Chemotherapy was changed back to  Velcade injection twice a week, 2 weeks on 1 week off. Her treatment was switched to to minimum response   04/20/2014 - 06/02/2014 Chemotherapy   chemotherapy is switched to Carfilzomib, Cytoxan and dexamethasone.   04/22/2014 Procedure   she has placement of port for chemotherapy.   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 at [facility]   10/20/2014 - 03/24/2015 Chemotherapy    she received chemotherapy with Kyprolis, Revlimid and dexamethasone   10/22/2014 Procedure   She has port placement   01/19/2015 Tumor Marker   IgA lambda M spike at 0.4 g    01/20/2015 Miscellaneous   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 No cource was found and fever resolved with IV vancomycin and meropenem   09/13/2015 Bone Marrow Biopsy   Bone marrow biopsy showed no increased blasts, 3-4 % plasma cells   03/02/2016 Bone Marrow Biopsy   Bone marrow biopsy at [facility] showed normocellular (30%) bone marrow with trilineage hematopoiesis. No significant increase in blasts. No significant increase in plasma cells.   05/12/2016 Imaging   DEXA scan at [facility] showed osteopenia   10/24/2016  Imaging   Skeletal survey at [facility], no new lesions   12/07/2017 Imaging   No focal abnormality noted to suggest myeloma. Exam is stable from prior exam.   03/01/2018 Procedure   Successful 8 French right internal jugular vein power port placement with its tip at the SVC/RA junction.   03/06/2018 -  Chemotherapy   The patient had daratumumab    07/09/2018 Bone Marrow Biopsy   Bone marrow biopsy at [facility] showed residual disease at 0.004% plasma cells   07/03/2019 Bone Marrow Biopsy   A. Bone marrow, flow cytometric analysis for multiple myeloma minimal residual disease detection:   Negative. No phenotypically abnormal plasma cells at or above the limit of detection identified.     No monotypic B-cell population identified. Negative for increased blasts.   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  10/31/2015 Miscellaneous   She has engrafted     REVIEW OF SYSTEMS:   Constitutional: Denies fevers, chills or abnormal weight loss Eyes: Denies blurriness of vision Ears, nose, mouth, throat, and face: Denies mucositis or sore throat Respiratory: Denies cough, dyspnea or wheezes Cardiovascular: Denies palpitation, chest discomfort or lower extremity swelling Gastrointestinal:  Denies nausea, heartburn or change in bowel habits Skin: Denies abnormal skin rashes Lymphatics: Denies new lymphadenopathy or  easy bruising Neurological:Denies numbness, tingling or new weaknesses Behavioral/Psych: Mood is stable, no new changes  All other systems were reviewed with the patient and are negative.  I have reviewed the past medical history, past surgical history, social history and family history with the patient and they are unchanged from previous note.  ALLERGIES:  has No Known Allergies.  MEDICATIONS:  Current Outpatient Medications  Medication Sig Dispense Refill  . acyclovir (ZOVIRAX) 400 MG tablet TAKE 1 TABLET BY MOUTH TWICE A DAY 180 tablet 11  . aspirin 325 MG tablet Take 325 mg by mouth daily.    . calcipotriene (DOVONOX) 0.005 % cream daily.     . calcium carbonate (OS-CAL - DOSED IN MG OF ELEMENTAL CALCIUM) 1250 (500 Ca) MG tablet     . calcium carbonate (TUMS - DOSED IN MG ELEMENTAL CALCIUM) 500 MG chewable tablet Chew 1 tablet by mouth daily.    . Cholecalciferol (VITAMIN D-1000 MAX ST) 1000 units tablet Take 1,000 Units by mouth daily.    . cyanocobalamin 1000 MCG tablet Take 1,000 mcg by mouth daily.    Marland Kitchen DARATUMUMAB IV Inject into the vein every 30 (thirty) days.    Marland Kitchen docusate sodium (COLACE) 100 MG capsule Take 100 mg by mouth daily as needed for mild constipation.     Marland Kitchen estradiol (ESTRACE VAGINAL) 0.1 MG/GM vaginal cream Place 1 Applicatorful vaginally at bedtime. 42.5 g 12  . loperamide (IMODIUM) 2 MG capsule Take by mouth as needed for diarrhea or loose stools. As needed only    . metoprolol tartrate (LOPRESSOR) 25 MG tablet SMARTSIG:1.5 Tablet(s) By Mouth Twice Daily    . mirtazapine (REMERON) 15 MG tablet TAKE 1 TABLET BY MOUTH EVERYDAY AT BEDTIME 90 tablet 1  . Multiple Vitamin (MULTIVITAMIN WITH MINERALS) TABS tablet Take 1 tablet by mouth daily.    . ondansetron (ZOFRAN) 8 MG tablet Take 1 tablet (8 mg total) by mouth every 8 (eight) hours as needed (Nausea or vomiting). 30 tablet 1  . pantoprazole (PROTONIX) 40 MG tablet TAKE 1 TABLET (40 MG TOTAL) BY MOUTH 2 (TWO) TIMES  DAILY. TAKE 30-60 MINUTES BEFORE BREAKFAST AND DINNER 180 tablet 2  . pomalidomide (POMALYST) 2 MG capsule Take 1 capsule (2 mg total) by mouth daily. Take with water on days 1-21. Repeat every 28 days. 21 capsule 0   No current facility-administered medications for this visit.   Facility-Administered Medications Ordered in Other Visits  Medication Dose Route Frequency Provider Last Rate Last Admin  . daratumumab (DARZALEX) 1,200 mg in sodium chloride 0.9 % 440 mL chemo infusion  16 mg/kg (Treatment Plan Recorded) Intravenous Once Miray Mancino, MD      . heparin lock flush 100 unit/mL  500 Units Intracatheter Once PRN Alvy Bimler, Krishauna Schatzman, MD      . sodium chloride flush (NS) 0.9 % injection 10 mL  10 mL Intracatheter PRN Alvy Bimler, Mery Guadalupe, MD        PHYSICAL EXAMINATION: ECOG PERFORMANCE STATUS: 1 - Symptomatic but completely ambulatory  Vitals:   03/25/20 0942  BP:  133/81  Pulse: (!) 55  Resp: 17  Temp: 97.7 F (36.5 C)  SpO2: 100%   Filed Weights   03/25/20 0942  Weight: 160 lb 12.8 oz (72.9 kg)    GENERAL:alert, no distress and comfortable SKIN: skin color, texture, turgor are normal, no rashes or significant lesions EYES: normal, Conjunctiva are pink and non-injected, sclera clear OROPHARYNX:no exudate, no erythema and lips, buccal mucosa, and tongue normal  NECK: supple, thyroid normal size, non-tender, without nodularity LYMPH:  no palpable lymphadenopathy in the cervical, axillary or inguinal LUNGS: clear to auscultation and percussion with normal breathing effort HEART: regular rate & rhythm and no murmurs and no lower extremity edema ABDOMEN:abdomen soft, non-tender and normal bowel sounds Musculoskeletal:no cyanosis of digits and no clubbing  NEURO: alert & oriented x 3 with fluent speech, no focal motor/sensory deficits  LABORATORY DATA:  I have reviewed the data as listed    Component Value Date/Time   NA 141 03/25/2020 0917   NA 142 01/04/2017 0902   K 4.0 03/25/2020 0917    K 3.9 01/04/2017 0902   CL 108 03/25/2020 0917   CO2 25 03/25/2020 0917   CO2 28 01/04/2017 0902   GLUCOSE 98 03/25/2020 0917   GLUCOSE 92 01/04/2017 0902   BUN 10 03/25/2020 0917   BUN 9.5 01/04/2017 0902   CREATININE 0.72 03/25/2020 0917   CREATININE 0.80 12/18/2018 0901   CREATININE 0.7 01/04/2017 0902   CALCIUM 8.7 (L) 03/25/2020 0917   CALCIUM 9.0 01/04/2017 0902   PROT 5.9 (L) 03/25/2020 0917   PROT 6.0 (L) 01/04/2017 0902   PROT 5.8 (L) 01/04/2017 0902   ALBUMIN 3.3 (L) 03/25/2020 0917   ALBUMIN 3.4 (L) 01/04/2017 0902   AST 9 (L) 03/25/2020 0917   AST 18 12/18/2018 0901   AST 21 01/04/2017 0902   ALT 7 03/25/2020 0917   ALT 14 12/18/2018 0901   ALT 16 01/04/2017 0902   ALKPHOS 72 03/25/2020 0917   ALKPHOS 101 01/04/2017 0902   BILITOT 0.3 03/25/2020 0917   BILITOT 0.3 12/18/2018 0901   BILITOT 0.56 01/04/2017 0902   GFRNONAA >60 03/25/2020 0917   GFRNONAA >60 12/18/2018 0901   GFRAA >60 03/25/2020 0917   GFRAA >60 12/18/2018 0901    No results found for: SPEP, UPEP  Lab Results  Component Value Date   WBC 4.0 03/25/2020   NEUTROABS 1.5 (L) 03/25/2020   HGB 12.3 03/25/2020   HCT 37.8 03/25/2020   MCV 106.2 (H) 03/25/2020   PLT 190 03/25/2020      Chemistry      Component Value Date/Time   NA 141 03/25/2020 0917   NA 142 01/04/2017 0902   K 4.0 03/25/2020 0917   K 3.9 01/04/2017 0902   CL 108 03/25/2020 0917   CO2 25 03/25/2020 0917   CO2 28 01/04/2017 0902   BUN 10 03/25/2020 0917   BUN 9.5 01/04/2017 0902   CREATININE 0.72 03/25/2020 0917   CREATININE 0.80 12/18/2018 0901   CREATININE 0.7 01/04/2017 0902      Component Value Date/Time   CALCIUM 8.7 (L) 03/25/2020 0917   CALCIUM 9.0 01/04/2017 0902   ALKPHOS 72 03/25/2020 0917   ALKPHOS 101 01/04/2017 0902   AST 9 (L) 03/25/2020 0917   AST 18 12/18/2018 0901   AST 21 01/04/2017 0902   ALT 7 03/25/2020 0917   ALT 14 12/18/2018 0901   ALT 16 01/04/2017 0902   BILITOT 0.3 03/25/2020  0917   BILITOT 0.3  12/18/2018 0901   BILITOT 0.56 01/04/2017 0902

## 2020-03-25 NOTE — Assessment & Plan Note (Signed)
The patient appears to be in complete remission with negative MRD We will proceed with treatment as scheduled now She remains on acyclovir for antimicrobial prophylaxis She will continue aspirin therapy for DVT prophylaxis She will continue calcium and vitamin D She has received long-term Zometa She is at risk of osteonecrosis of the jaw I would discontinue Zometa permanently

## 2020-03-26 LAB — KAPPA/LAMBDA LIGHT CHAINS
Kappa free light chain: 17.4 mg/L (ref 3.3–19.4)
Kappa, lambda light chain ratio: 0.82 (ref 0.26–1.65)
Lambda free light chains: 21.2 mg/L (ref 5.7–26.3)

## 2020-03-29 LAB — MULTIPLE MYELOMA PANEL, SERUM
Albumin SerPl Elph-Mcnc: 3.4 g/dL (ref 2.9–4.4)
Albumin/Glob SerPl: 1.8 — ABNORMAL HIGH (ref 0.7–1.7)
Alpha 1: 0.2 g/dL (ref 0.0–0.4)
Alpha2 Glob SerPl Elph-Mcnc: 0.8 g/dL (ref 0.4–1.0)
B-Globulin SerPl Elph-Mcnc: 0.6 g/dL — ABNORMAL LOW (ref 0.7–1.3)
Gamma Glob SerPl Elph-Mcnc: 0.4 g/dL (ref 0.4–1.8)
Globulin, Total: 2 g/dL — ABNORMAL LOW (ref 2.2–3.9)
IgA: 238 mg/dL (ref 64–422)
IgG (Immunoglobin G), Serum: 427 mg/dL — ABNORMAL LOW (ref 586–1602)
IgM (Immunoglobulin M), Srm: 47 mg/dL (ref 26–217)
M Protein SerPl Elph-Mcnc: 0.1 g/dL — ABNORMAL HIGH
Total Protein ELP: 5.4 g/dL — ABNORMAL LOW (ref 6.0–8.5)

## 2020-03-30 ENCOUNTER — Encounter: Payer: Self-pay | Admitting: Hematology and Oncology

## 2020-03-30 ENCOUNTER — Telehealth: Payer: Self-pay

## 2020-03-30 NOTE — Telephone Encounter (Signed)
-----   Message from [name], MD sent at 03/30/2020  7:57 AM EDT ----- Regarding: myeloma panel result Pls call and let her know myeloma panel detected a bit of M protein No change in plan, continue treatment as scheduled

## 2020-03-30 NOTE — Telephone Encounter (Signed)
Called pt per Dr Alvy Bimler. Pt is in agreement with this plan and understands results. Pt understands to call with any questions/concerns.

## 2020-04-02 ENCOUNTER — Other Ambulatory Visit: Payer: Self-pay | Admitting: Physician Assistant

## 2020-04-02 NOTE — Telephone Encounter (Signed)
Rx request sent to pharmacy.  

## 2020-04-08 ENCOUNTER — Encounter: Payer: Self-pay | Admitting: Hematology and Oncology

## 2020-04-08 ENCOUNTER — Other Ambulatory Visit: Payer: Self-pay

## 2020-04-08 DIAGNOSIS — C9002 Multiple myeloma in relapse: Secondary | ICD-10-CM

## 2020-04-08 MED ORDER — POMALIDOMIDE 2 MG PO CAPS: 2.0000 mg | ORAL_CAPSULE | Freq: Every day | ORAL | 0 refills | Status: DC

## 2020-04-16 ENCOUNTER — Encounter: Payer: Self-pay | Admitting: Hematology and Oncology

## 2020-04-22 ENCOUNTER — Inpatient Hospital Stay: Payer: Medicare HMO | Admitting: Hematology and Oncology

## 2020-04-22 ENCOUNTER — Inpatient Hospital Stay: Payer: Medicare HMO | Attending: Hematology and Oncology

## 2020-04-22 ENCOUNTER — Inpatient Hospital Stay: Payer: Medicare HMO

## 2020-04-22 ENCOUNTER — Other Ambulatory Visit: Payer: Self-pay

## 2020-04-22 ENCOUNTER — Encounter: Payer: Self-pay | Admitting: Hematology and Oncology

## 2020-04-22 VITALS — BP 118/60 | HR 60 | Temp 98.4°F | Resp 18

## 2020-04-22 DIAGNOSIS — D61818 Other pancytopenia: Secondary | ICD-10-CM | POA: Diagnosis not present

## 2020-04-22 DIAGNOSIS — M8580 Other specified disorders of bone density and structure, unspecified site: Secondary | ICD-10-CM | POA: Diagnosis not present

## 2020-04-22 DIAGNOSIS — Z7982 Long term (current) use of aspirin: Secondary | ICD-10-CM | POA: Diagnosis not present

## 2020-04-22 DIAGNOSIS — T451X5A Adverse effect of antineoplastic and immunosuppressive drugs, initial encounter: Secondary | ICD-10-CM | POA: Diagnosis not present

## 2020-04-22 DIAGNOSIS — C9000 Multiple myeloma not having achieved remission: Secondary | ICD-10-CM

## 2020-04-22 DIAGNOSIS — C9002 Multiple myeloma in relapse: Secondary | ICD-10-CM | POA: Insufficient documentation

## 2020-04-22 DIAGNOSIS — Z5112 Encounter for antineoplastic immunotherapy: Secondary | ICD-10-CM | POA: Insufficient documentation

## 2020-04-22 DIAGNOSIS — Z79899 Other long term (current) drug therapy: Secondary | ICD-10-CM | POA: Insufficient documentation

## 2020-04-22 DIAGNOSIS — G620 Drug-induced polyneuropathy: Secondary | ICD-10-CM | POA: Diagnosis not present

## 2020-04-22 LAB — COMPREHENSIVE METABOLIC PANEL
ALT: 7 U/L (ref 0–44)
AST: 8 U/L — ABNORMAL LOW (ref 15–41)
Albumin: 3.3 g/dL — ABNORMAL LOW (ref 3.5–5.0)
Alkaline Phosphatase: 67 U/L (ref 38–126)
Anion gap: 5 (ref 5–15)
BUN: 16 mg/dL (ref 8–23)
CO2: 26 mmol/L (ref 22–32)
Calcium: 8.6 mg/dL — ABNORMAL LOW (ref 8.9–10.3)
Chloride: 109 mmol/L (ref 98–111)
Creatinine, Ser: 0.7 mg/dL (ref 0.44–1.00)
GFR, Estimated: >60 mL/min (ref >60)
Glucose, Bld: 90 mg/dL (ref 70–99)
Potassium: 4.1 mmol/L (ref 3.5–5.1)
Sodium: 140 mmol/L (ref 135–145)
Total Bilirubin: 0.3 mg/dL (ref 0.3–1.2)
Total Protein: 5.8 g/dL — ABNORMAL LOW (ref 6.5–8.1)

## 2020-04-22 LAB — CBC WITH DIFFERENTIAL/PLATELET
Abs Immature Granulocytes: 0 10*3/uL (ref 0.00–0.07)
Basophils Absolute: 0.1 10*3/uL (ref 0.0–0.1)
Basophils Relative: 1 %
Eosinophils Absolute: 0 10*3/uL (ref 0.0–0.5)
Eosinophils Relative: 0 %
HCT: 36 % (ref 36.0–46.0)
Hemoglobin: 11.8 g/dL — ABNORMAL LOW (ref 12.0–15.0)
Immature Granulocytes: 0 %
Lymphocytes Relative: 45 %
Lymphs Abs: 2 10*3/uL (ref 0.7–4.0)
MCH: 34.8 pg — ABNORMAL HIGH (ref 26.0–34.0)
MCHC: 32.8 g/dL (ref 30.0–36.0)
MCV: 106.2 fL — ABNORMAL HIGH (ref 80.0–100.0)
Monocytes Absolute: 0.9 10*3/uL (ref 0.1–1.0)
Monocytes Relative: 19 %
Neutro Abs: 1.6 10*3/uL — ABNORMAL LOW (ref 1.7–7.7)
Neutrophils Relative %: 35 %
Platelets: 131 10*3/uL — ABNORMAL LOW (ref 150–400)
RBC: 3.39 MIL/uL — ABNORMAL LOW (ref 3.87–5.11)
RDW: 13.9 % (ref 11.5–15.5)
WBC: 4.5 10*3/uL (ref 4.0–10.5)
nRBC: 0 % (ref 0.0–0.2)

## 2020-04-22 MED ORDER — SODIUM CHLORIDE 0.9 % IV SOLN
Freq: Once | INTRAVENOUS | Status: AC
  Filled 2020-04-22: qty 250

## 2020-04-22 MED ORDER — DEXAMETHASONE SODIUM PHOSPHATE 10 MG/ML IJ SOLN
INTRAMUSCULAR | Status: AC
  Filled 2020-04-22: qty 1

## 2020-04-22 MED ORDER — PROCHLORPERAZINE MALEATE 10 MG PO TABS
10.0000 mg | ORAL_TABLET | Freq: Once | ORAL | Status: AC
  Administered 2020-04-22: 10 mg via ORAL

## 2020-04-22 MED ORDER — HEPARIN SOD (PORK) LOCK FLUSH 100 UNIT/ML IV SOLN
500.0000 [IU] | Freq: Once | INTRAVENOUS | Status: AC | PRN
  Administered 2020-04-22: 500 [IU]
  Filled 2020-04-22: qty 5

## 2020-04-22 MED ORDER — DEXAMETHASONE SODIUM PHOSPHATE 10 MG/ML IJ SOLN
4.0000 mg | Freq: Once | INTRAMUSCULAR | Status: AC
  Administered 2020-04-22: 4 mg via INTRAVENOUS

## 2020-04-22 MED ORDER — ACETAMINOPHEN 325 MG PO TABS
ORAL_TABLET | ORAL | Status: AC
  Filled 2020-04-22: qty 2

## 2020-04-22 MED ORDER — SODIUM CHLORIDE 0.9% FLUSH
10.0000 mL | INTRAVENOUS | Status: DC | PRN
  Administered 2020-04-22: 10 mL
  Filled 2020-04-22: qty 10

## 2020-04-22 MED ORDER — SODIUM CHLORIDE 0.9 % IV SOLN
16.0000 mg/kg | Freq: Once | INTRAVENOUS | Status: AC
  Administered 2020-04-22: 1200 mg via INTRAVENOUS
  Filled 2020-04-22: qty 60

## 2020-04-22 MED ORDER — ACETAMINOPHEN 325 MG PO TABS
650.0000 mg | ORAL_TABLET | Freq: Once | ORAL | Status: AC
  Administered 2020-04-22: 650 mg via ORAL

## 2020-04-22 MED ORDER — PROCHLORPERAZINE MALEATE 10 MG PO TABS
ORAL_TABLET | ORAL | Status: AC
  Filled 2020-04-22: qty 1

## 2020-04-22 NOTE — Assessment & Plan Note (Signed)
The patient appears to be in complete remission with negative MRD with last bone marrow evaluation Her recent myeloma panel detected an abnormal clone It is being repeated today, will call her with test results next week We will proceed with treatment as scheduled now She remains on acyclovir for antimicrobial prophylaxis She will continue aspirin therapy for DVT prophylaxis She will continue calcium and vitamin D She has received long-term Zometa She is at risk of osteonecrosis of the jaw I would discontinue Zometa permanently

## 2020-04-22 NOTE — Progress Notes (Signed)
[place] OFFICE PROGRESS NOTE  Patient Care Team: [name], [name], MD as PCP - General (Internal Medicine) [name], MD as PCP - Cardiology (Cardiology) [name], MD as Referring Physician (Hematology and Oncology) [name], MD as Consulting Physician (Internal Medicine) [name], [name], MD as Consulting Physician (Infectious Diseases) [name], MD as Consulting Physician (Hematology and Oncology) [name], NP as Nurse Practitioner (Hematology and Oncology)  ASSESSMENT & PLAN:  Multiple myeloma [facility]) The patient appears to be in complete remission with negative MRD with last bone marrow evaluation Her recent myeloma panel detected an abnormal clone It is being repeated today, will call her with test results next week We will proceed with treatment as scheduled now She remains on acyclovir for antimicrobial prophylaxis She will continue aspirin therapy for DVT prophylaxis She will continue calcium and vitamin D She has received long-term Zometa She is at risk of osteonecrosis of the jaw I would discontinue Zometa permanently  Pancytopenia, acquired ([place]) She has acquired pancytopenia due to treatment She is not symptomatic Observe only  Peripheral neuropathy due to chemotherapy [facility]) She has very mild cold sensitivity recently, likely residual peripheral neuropathy from prior treatment Observe only for now   No orders of the defined types were placed in this encounter.   All questions were answered. The patient knows to call the clinic with any problems, questions or concerns. The total time spent in the appointment was 20 minutes encounter with patients including review of chart and various tests results, discussions about plan of care and coordination of care plan   [name], MD 04/22/2020 11:58 AM  INTERVAL HISTORY: Please see below for problem oriented charting. She returns for  treatment and follow-up She had very mild cold sensitivity recently at the tips of her toes She is fully vaccinated No recent infection, fever or chills No new bone pain  SUMMARY OF ONCOLOGIC HISTORY: Oncology History Overview Note  Multiple myeloma, Ig A Lambda, M spike 3.54 grams, Calcium 9.2, Creatinine 0.8, Beta 2 microglobulin 4.52, IgA 4840 mg/dL, lambda light chain 75.4, albumin 3.6, hemoglobin 9.7, platelet 115    Primary site: Multiple Myeloma   Staging method: AJCC 6th Edition   Clinical: Stage IIA signed by [name], MD on 11/07/2013  2:46 PM   Summary: Stage IIA      Multiple myeloma ([place])  10/31/2013 Bone Marrow Biopsy   Bone marrow biopsy confirmed multiple myeloma with 40% bone marrow involvement. Skeletal survey showed minimal lesions in her score with generalized demineralization   11/10/2013 - 02/13/2014 Chemotherapy   The patient is started on induction chemotherapy with weekly dexamethasone 40 mg by mouth as well as Velcade subcutaneous injection on days 1, 4, 8 and 11. On 11/21/2013, she was started on monthly Zometa.   12/23/2013 Adverse Reaction   The dose of Velcade was reduced due to thrombocytopenia.   01/28/2014 - 04/07/2014 Chemotherapy   Revlimid is added. Treatment was discontinued due to lack of response.   02/24/2014 - 04/07/2014 Chemotherapy   Due to worsening peripheral neuropathy, Velcade injection is changed to once a week. Revlimid was given 21 days on, 7 days off.   04/07/2014 - 04/10/2014 Chemotherapy   Revlimid was discontinued due to lack of response. Chemotherapy was changed back to Velcade injection twice a week, 2 weeks on 1 week off. Her treatment was switched to to minimum response   04/20/2014 - 06/02/2014 Chemotherapy   chemotherapy is switched  to Carfilzomib, Cytoxan and dexamethasone.   04/22/2014 Procedure   she has placement of port for chemotherapy.   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 at [facility]   10/20/2014 - 03/24/2015 Chemotherapy    she received chemotherapy with Kyprolis, Revlimid and dexamethasone   10/22/2014 Procedure   She has port placement   01/19/2015 Tumor Marker   IgA lambda M spike at 0.4 g    01/20/2015 Miscellaneous   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 No cource was found and fever resolved with IV vancomycin and meropenem   09/13/2015 Bone Marrow Biopsy   Bone marrow biopsy showed no increased blasts, 3-4 % plasma cells   03/02/2016 Bone Marrow Biopsy   Bone marrow biopsy at [facility] showed normocellular (30%) bone marrow with trilineage hematopoiesis. No significant increase in blasts. No significant increase in plasma cells.   05/12/2016 Imaging   DEXA scan at [facility] showed osteopenia   10/24/2016 Imaging   Skeletal survey at [facility], no new lesions   12/07/2017 Imaging   No focal abnormality noted to suggest myeloma. Exam is stable from prior exam.   03/01/2018 Procedure   Successful 8 French right internal jugular vein power port placement with its tip at the SVC/RA junction.   03/06/2018 -  Chemotherapy   The patient had daratumumab    07/09/2018 Bone  Marrow Biopsy   Bone marrow biopsy at [facility] showed residual disease at 0.004% plasma cells   07/03/2019 Bone Marrow Biopsy   A. Bone marrow, flow cytometric analysis for multiple myeloma minimal residual disease detection:   Negative. No phenotypically abnormal plasma cells at or above the limit of detection identified.     No monotypic B-cell population identified. Negative for increased blasts.   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   10/31/2015 Miscellaneous   She has engrafted     REVIEW OF SYSTEMS:   Constitutional: Denies fevers, chills or abnormal weight loss Eyes: Denies blurriness of vision Ears, nose, mouth, throat, and face: Denies mucositis or sore throat Respiratory: Denies cough, dyspnea or wheezes Cardiovascular: Denies palpitation, chest discomfort or lower extremity swelling Gastrointestinal:  Denies nausea,  heartburn or change in bowel habits Skin: Denies abnormal skin rashes Lymphatics: Denies new lymphadenopathy or easy bruising Behavioral/Psych: Mood is stable, no new changes  All other systems were reviewed with the patient and are negative.  I have reviewed the past medical history, past surgical history, social history and family history with the patient and they are unchanged from previous note.  ALLERGIES:  has No Known Allergies.  MEDICATIONS:  Current Outpatient  Medications  Medication Sig Dispense Refill  . acyclovir (ZOVIRAX) 400 MG tablet TAKE 1 TABLET BY MOUTH TWICE A DAY 180 tablet 11  . aspirin 325 MG tablet Take 325 mg by mouth daily.    . calcipotriene (DOVONOX) 0.005 % cream daily.     . calcium carbonate (OS-CAL - DOSED IN MG OF ELEMENTAL CALCIUM) 1250 (500 Ca) MG tablet     . calcium carbonate (TUMS - DOSED IN MG ELEMENTAL CALCIUM) 500 MG chewable tablet Chew 1 tablet by mouth daily.    . Cholecalciferol (VITAMIN D-1000 MAX ST) 1000 units tablet Take 1,000 Units by mouth daily.    . cyanocobalamin 1000 MCG tablet Take 1,000 mcg by mouth daily.    Marland Kitchen DARATUMUMAB IV Inject into the vein every 30 (thirty) days.    Marland Kitchen docusate sodium (COLACE) 100 MG capsule Take 100 mg by mouth daily as needed for mild constipation.     Marland Kitchen estradiol (ESTRACE VAGINAL) 0.1 MG/GM vaginal cream Place 1 Applicatorful vaginally at bedtime. 42.5 g 12  . loperamide (IMODIUM) 2 MG capsule Take by mouth as needed for diarrhea or loose stools. As needed only    . metoprolol tartrate (LOPRESSOR) 25 MG tablet TAKE 1.5 TABLETS (37.5 MG TOTAL) BY MOUTH 2 (TWO) TIMES DAILY. 270 tablet 3  . mirtazapine (REMERON) 15 MG tablet TAKE 1 TABLET BY MOUTH EVERYDAY AT BEDTIME 90 tablet 1  . Multiple Vitamin (MULTIVITAMIN WITH MINERALS) TABS tablet Take 1 tablet by mouth daily.    . ondansetron (ZOFRAN) 8 MG tablet Take 1 tablet (8 mg total) by mouth every 8 (eight) hours as needed (Nausea or vomiting). 30 tablet 1  . pantoprazole (PROTONIX) 40 MG tablet TAKE 1 TABLET (40 MG TOTAL) BY MOUTH 2 (TWO) TIMES DAILY. TAKE 30-60 MINUTES BEFORE BREAKFAST AND DINNER 180 tablet 2  . pomalidomide (POMALYST) 2 MG capsule Take 1 capsule (2 mg total) by mouth daily. Take with water on days 1-21. Repeat every 28 days. 21 capsule 0   No current facility-administered medications for this visit.   Facility-Administered Medications Ordered in Other Visits  Medication Dose Route Frequency Provider Last Rate  Last Admin  . [COMPLETED] daratumumab (DARZALEX) 1,200 mg in sodium chloride 0.9 % 440 mL chemo infusion  16 mg/kg (Treatment Plan Recorded) Intravenous Once Alvy Bimler, Rosary Filosa, MD 200 mL/hr at 04/22/20 1200 1,200 mg at 04/22/20 1200  . heparin lock flush 100 unit/mL  500 Units Intracatheter Once PRN Alvy Bimler, Shirin Echeverry, MD      . sodium chloride flush (NS) 0.9 % injection 10 mL  10 mL Intracatheter PRN Alvy Bimler, Adrianna Dudas, MD        PHYSICAL EXAMINATION: ECOG PERFORMANCE STATUS: 1 - Symptomatic but completely ambulatory  Vitals:   04/22/20 0916  BP: (!) 145/65  Pulse: (!) 55  Resp: 20  Temp: (!) 97 F (36.1 C)  SpO2: 100%   Filed Weights   04/22/20 0916  Weight: 161 lb (73 kg)    GENERAL:alert, no distress and comfortable SKIN: skin color, texture, turgor are normal, no rashes or  significant lesions EYES: normal, Conjunctiva are pink and non-injected, sclera clear OROPHARYNX:no exudate, no erythema and lips, buccal mucosa, and tongue normal  NECK: supple, thyroid normal size, non-tender, without nodularity LYMPH:  no palpable lymphadenopathy in the cervical, axillary or inguinal LUNGS: clear to auscultation and percussion with normal breathing effort HEART: regular rate & rhythm and no murmurs and no lower extremity edema ABDOMEN:abdomen soft, non-tender and normal bowel sounds Musculoskeletal:no cyanosis of digits and no clubbing  NEURO: alert & oriented x 3 with fluent speech, no focal motor/sensory deficits  LABORATORY DATA:  I have reviewed the data as listed    Component Value Date/Time   NA 140 04/22/2020 0910   NA 142 01/04/2017 0902   K 4.1 04/22/2020 0910   K 3.9 01/04/2017 0902   CL 109 04/22/2020 0910   CO2 26 04/22/2020 0910   CO2 28 01/04/2017 0902   GLUCOSE 90 04/22/2020 0910   GLUCOSE 92 01/04/2017 0902   BUN 16 04/22/2020 0910   BUN 9.5 01/04/2017 0902   CREATININE 0.70 04/22/2020 0910   CREATININE 0.80 12/18/2018 0901   CREATININE 0.7 01/04/2017 0902   CALCIUM 8.6 (L)  04/22/2020 0910   CALCIUM 9.0 01/04/2017 0902   PROT 5.8 (L) 04/22/2020 0910   PROT 6.0 (L) 01/04/2017 0902   PROT 5.8 (L) 01/04/2017 0902   ALBUMIN 3.3 (L) 04/22/2020 0910   ALBUMIN 3.4 (L) 01/04/2017 0902   AST 8 (L) 04/22/2020 0910   AST 18 12/18/2018 0901   AST 21 01/04/2017 0902   ALT 7 04/22/2020 0910   ALT 14 12/18/2018 0901   ALT 16 01/04/2017 0902   ALKPHOS 67 04/22/2020 0910   ALKPHOS 101 01/04/2017 0902   BILITOT 0.3 04/22/2020 0910   BILITOT 0.3 12/18/2018 0901   BILITOT 0.56 01/04/2017 0902   GFRNONAA >60 04/22/2020 0910   GFRNONAA >60 12/18/2018 0901   GFRAA >60 03/25/2020 0917   GFRAA >60 12/18/2018 0901    No results found for: SPEP, UPEP  Lab Results  Component Value Date   WBC 4.5 04/22/2020   NEUTROABS 1.6 (L) 04/22/2020   HGB 11.8 (L) 04/22/2020   HCT 36.0 04/22/2020   MCV 106.2 (H) 04/22/2020   PLT 131 (L) 04/22/2020      Chemistry      Component Value Date/Time   NA 140 04/22/2020 0910   NA 142 01/04/2017 0902   K 4.1 04/22/2020 0910   K 3.9 01/04/2017 0902   CL 109 04/22/2020 0910   CO2 26 04/22/2020 0910   CO2 28 01/04/2017 0902   BUN 16 04/22/2020 0910   BUN 9.5 01/04/2017 0902   CREATININE 0.70 04/22/2020 0910   CREATININE 0.80 12/18/2018 0901   CREATININE 0.7 01/04/2017 0902      Component Value Date/Time   CALCIUM 8.6 (L) 04/22/2020 0910   CALCIUM 9.0 01/04/2017 0902   ALKPHOS 67 04/22/2020 0910   ALKPHOS 101 01/04/2017 0902   AST 8 (L) 04/22/2020 0910   AST 18 12/18/2018 0901   AST 21 01/04/2017 0902   ALT 7 04/22/2020 0910   ALT 14 12/18/2018 0901   ALT 16 01/04/2017 0902   BILITOT 0.3 04/22/2020 0910   BILITOT 0.3 12/18/2018 0901   BILITOT 0.56 01/04/2017 0902

## 2020-04-22 NOTE — Assessment & Plan Note (Signed)
She has acquired pancytopenia due to treatment She is not symptomatic Observe only

## 2020-04-22 NOTE — Patient Instructions (Signed)
[facility] Discharge Instructions for Patients Receiving Chemotherapy  Today you received the following chemotherapy agents: daratumumab.  To help prevent nausea and vomiting after your treatment, we encourage you to take your nausea medication as directed.   If you develop nausea and vomiting that is not controlled by your nausea medication, call the clinic.   BELOW ARE SYMPTOMS THAT SHOULD BE REPORTED IMMEDIATELY:  *FEVER GREATER THAN 100.5 F  *CHILLS WITH OR WITHOUT FEVER  NAUSEA AND VOMITING THAT IS NOT CONTROLLED WITH YOUR NAUSEA MEDICATION  *UNUSUAL SHORTNESS OF BREATH  *UNUSUAL BRUISING OR BLEEDING  TENDERNESS IN MOUTH AND THROAT WITH OR WITHOUT PRESENCE OF ULCERS  *URINARY PROBLEMS  *BOWEL PROBLEMS  UNUSUAL RASH Items with * indicate a potential emergency and should be followed up as soon as possible.  Feel free to call the clinic should you have any questions or concerns. The clinic phone number is [phone].  Please show the CHEMO ALERT CARD at check-in to the Emergency Department and triage nurse.   

## 2020-04-22 NOTE — Assessment & Plan Note (Signed)
She has very mild cold sensitivity recently, likely residual peripheral neuropathy from prior treatment Observe only for now

## 2020-04-23 LAB — KAPPA/LAMBDA LIGHT CHAINS
Kappa free light chain: 18.3 mg/L (ref 3.3–19.4)
Kappa, lambda light chain ratio: 0.76 (ref 0.26–1.65)
Lambda free light chains: 24 mg/L (ref 5.7–26.3)

## 2020-04-24 LAB — MULTIPLE MYELOMA PANEL, SERUM
Albumin SerPl Elph-Mcnc: 3.4 g/dL (ref 2.9–4.4)
Albumin/Glob SerPl: 1.7 (ref 0.7–1.7)
Alpha 1: 0.2 g/dL (ref 0.0–0.4)
Alpha2 Glob SerPl Elph-Mcnc: 0.7 g/dL (ref 0.4–1.0)
B-Globulin SerPl Elph-Mcnc: 0.7 g/dL (ref 0.7–1.3)
Gamma Glob SerPl Elph-Mcnc: 0.5 g/dL (ref 0.4–1.8)
Globulin, Total: 2.1 g/dL — ABNORMAL LOW (ref 2.2–3.9)
IgA: 228 mg/dL (ref 64–422)
IgG (Immunoglobin G), Serum: 431 mg/dL — ABNORMAL LOW (ref 586–1602)
IgM (Immunoglobulin M), Srm: 46 mg/dL (ref 26–217)
Total Protein ELP: 5.5 g/dL — ABNORMAL LOW (ref 6.0–8.5)

## 2020-05-04 ENCOUNTER — Encounter: Payer: Self-pay | Admitting: Hematology and Oncology

## 2020-05-04 ENCOUNTER — Other Ambulatory Visit: Payer: Self-pay

## 2020-05-04 DIAGNOSIS — C9002 Multiple myeloma in relapse: Secondary | ICD-10-CM

## 2020-05-04 MED ORDER — POMALIDOMIDE 2 MG PO CAPS: 2.0000 mg | ORAL_CAPSULE | Freq: Every day | ORAL | 0 refills | Status: DC

## 2020-05-11 ENCOUNTER — Encounter: Payer: Self-pay | Admitting: Hematology and Oncology

## 2020-05-19 ENCOUNTER — Telehealth: Payer: Self-pay

## 2020-05-19 NOTE — Telephone Encounter (Signed)
Patient called to let me know that [employer] called her to let her know that they could not offer her assistance for 2022. I will research grants for patient.

## 2020-05-19 NOTE — Telephone Encounter (Signed)
[employer] called patient to let her know she needed to apply trough [employer] for Pomalyst assistance for 2022.  Oral Oncology Patient Advocate Encounter  Met patient in lobby to complete application for [place] in an effort to reduce patient's out of pocket expense for Pomalyst to $0.    Application completed and faxed to [phone].   [employer] Access Support phone number for follow up is [phone].  This encounter will be updated until final determination.  [place] Patient [place] Phone [phone] Fax [phone] 05/19/2020 9:24 AM

## 2020-05-21 ENCOUNTER — Other Ambulatory Visit: Payer: Self-pay | Admitting: *Deleted

## 2020-05-21 DIAGNOSIS — C9002 Multiple myeloma in relapse: Secondary | ICD-10-CM

## 2020-05-21 MED ORDER — POMALIDOMIDE 2 MG PO CAPS: 2.0000 mg | ORAL_CAPSULE | Freq: Every day | ORAL | 0 refills | Status: DC

## 2020-05-25 NOTE — Telephone Encounter (Signed)
Spoke to patient today. Income is too high for any grant assistance.  I encouraged patient to write an appeal letter to [employer] to attempt assistance approval.  Patient would like for me to fax to [employer] once she collects paperwork.  [place] Patient [place] Phone [phone] Fax [phone] 05/25/2020 10:31 AM

## 2020-05-26 ENCOUNTER — Inpatient Hospital Stay: Payer: Medicare HMO | Attending: Hematology and Oncology

## 2020-05-26 ENCOUNTER — Inpatient Hospital Stay: Payer: Medicare HMO

## 2020-05-26 ENCOUNTER — Telehealth: Payer: Self-pay | Admitting: Hematology and Oncology

## 2020-05-26 ENCOUNTER — Other Ambulatory Visit: Payer: Self-pay

## 2020-05-26 ENCOUNTER — Inpatient Hospital Stay: Payer: Medicare HMO | Admitting: Hematology and Oncology

## 2020-05-26 ENCOUNTER — Encounter: Payer: Self-pay | Admitting: Hematology and Oncology

## 2020-05-26 VITALS — BP 137/63 | HR 70 | Temp 98.0°F | Resp 18

## 2020-05-26 DIAGNOSIS — Z79899 Other long term (current) drug therapy: Secondary | ICD-10-CM | POA: Diagnosis not present

## 2020-05-26 DIAGNOSIS — Z7982 Long term (current) use of aspirin: Secondary | ICD-10-CM | POA: Insufficient documentation

## 2020-05-26 DIAGNOSIS — C9000 Multiple myeloma not having achieved remission: Secondary | ICD-10-CM

## 2020-05-26 DIAGNOSIS — C9002 Multiple myeloma in relapse: Secondary | ICD-10-CM | POA: Diagnosis present

## 2020-05-26 DIAGNOSIS — Z5112 Encounter for antineoplastic immunotherapy: Secondary | ICD-10-CM | POA: Diagnosis present

## 2020-05-26 DIAGNOSIS — Z599 Problem related to housing and economic circumstances, unspecified: Secondary | ICD-10-CM | POA: Diagnosis not present

## 2020-05-26 DIAGNOSIS — D61818 Other pancytopenia: Secondary | ICD-10-CM

## 2020-05-26 DIAGNOSIS — D469 Myelodysplastic syndrome, unspecified: Secondary | ICD-10-CM

## 2020-05-26 DIAGNOSIS — M8580 Other specified disorders of bone density and structure, unspecified site: Secondary | ICD-10-CM | POA: Insufficient documentation

## 2020-05-26 DIAGNOSIS — G629 Polyneuropathy, unspecified: Secondary | ICD-10-CM | POA: Diagnosis not present

## 2020-05-26 LAB — COMPREHENSIVE METABOLIC PANEL
ALT: 7 U/L (ref 0–44)
AST: 10 U/L — ABNORMAL LOW (ref 15–41)
Albumin: 3.2 g/dL — ABNORMAL LOW (ref 3.5–5.0)
Alkaline Phosphatase: 75 U/L (ref 38–126)
Anion gap: 8 (ref 5–15)
BUN: 12 mg/dL (ref 8–23)
CO2: 26 mmol/L (ref 22–32)
Calcium: 8.6 mg/dL — ABNORMAL LOW (ref 8.9–10.3)
Chloride: 106 mmol/L (ref 98–111)
Creatinine, Ser: 0.69 mg/dL (ref 0.44–1.00)
GFR, Estimated: >60 mL/min (ref >60)
Glucose, Bld: 108 mg/dL — ABNORMAL HIGH (ref 70–99)
Potassium: 3.8 mmol/L (ref 3.5–5.1)
Sodium: 140 mmol/L (ref 135–145)
Total Bilirubin: 0.3 mg/dL (ref 0.3–1.2)
Total Protein: 5.8 g/dL — ABNORMAL LOW (ref 6.5–8.1)

## 2020-05-26 LAB — CBC WITH DIFFERENTIAL/PLATELET
Abs Immature Granulocytes: 0.01 10*3/uL (ref 0.00–0.07)
Basophils Absolute: 0 10*3/uL (ref 0.0–0.1)
Basophils Relative: 1 %
Eosinophils Absolute: 0 10*3/uL (ref 0.0–0.5)
Eosinophils Relative: 0 %
HCT: 36.3 % (ref 36.0–46.0)
Hemoglobin: 11.7 g/dL — ABNORMAL LOW (ref 12.0–15.0)
Immature Granulocytes: 0 %
Lymphocytes Relative: 33 %
Lymphs Abs: 1.5 10*3/uL (ref 0.7–4.0)
MCH: 34.6 pg — ABNORMAL HIGH (ref 26.0–34.0)
MCHC: 32.2 g/dL (ref 30.0–36.0)
MCV: 107.4 fL — ABNORMAL HIGH (ref 80.0–100.0)
Monocytes Absolute: 0.3 10*3/uL (ref 0.1–1.0)
Monocytes Relative: 7 %
Neutro Abs: 2.7 10*3/uL (ref 1.7–7.7)
Neutrophils Relative %: 59 %
Platelets: 123 10*3/uL — ABNORMAL LOW (ref 150–400)
RBC: 3.38 MIL/uL — ABNORMAL LOW (ref 3.87–5.11)
RDW: 13.7 % (ref 11.5–15.5)
WBC: 4.5 10*3/uL (ref 4.0–10.5)
nRBC: 0 % (ref 0.0–0.2)

## 2020-05-26 MED ORDER — SODIUM CHLORIDE 0.9 % IV SOLN
16.0000 mg/kg | Freq: Once | INTRAVENOUS | Status: AC
  Administered 2020-05-26: 1200 mg via INTRAVENOUS
  Filled 2020-05-26: qty 60

## 2020-05-26 MED ORDER — SODIUM CHLORIDE 0.9% FLUSH
10.0000 mL | Freq: Once | INTRAVENOUS | Status: AC
  Administered 2020-05-26: 10 mL
  Filled 2020-05-26: qty 10

## 2020-05-26 MED ORDER — FAMOTIDINE IN NACL 20-0.9 MG/50ML-% IV SOLN
INTRAVENOUS | Status: AC
  Filled 2020-05-26: qty 50

## 2020-05-26 MED ORDER — MIRTAZAPINE 15 MG PO TABS
15.0000 mg | ORAL_TABLET | Freq: Every day | ORAL | 1 refills | Status: DC
Start: 2020-05-26 — End: 2020-08-30

## 2020-05-26 MED ORDER — HEPARIN SOD (PORK) LOCK FLUSH 100 UNIT/ML IV SOLN
500.0000 [IU] | Freq: Once | INTRAVENOUS | Status: AC | PRN
  Administered 2020-05-26: 500 [IU]
  Filled 2020-05-26: qty 5

## 2020-05-26 MED ORDER — SODIUM CHLORIDE 0.9% FLUSH
10.0000 mL | INTRAVENOUS | Status: DC | PRN
  Administered 2020-05-26: 10 mL
  Filled 2020-05-26: qty 10

## 2020-05-26 MED ORDER — FAMOTIDINE IN NACL 20-0.9 MG/50ML-% IV SOLN
20.0000 mg | Freq: Once | INTRAVENOUS | Status: AC
  Administered 2020-05-26: 20 mg via INTRAVENOUS

## 2020-05-26 MED ORDER — SODIUM CHLORIDE 0.9 % IV SOLN
Freq: Once | INTRAVENOUS | Status: AC
  Filled 2020-05-26: qty 250

## 2020-05-26 MED ORDER — ACETAMINOPHEN 325 MG PO TABS
ORAL_TABLET | ORAL | Status: AC
  Filled 2020-05-26: qty 2

## 2020-05-26 MED ORDER — ACETAMINOPHEN 325 MG PO TABS
650.0000 mg | ORAL_TABLET | Freq: Once | ORAL | Status: AC
  Administered 2020-05-26: 650 mg via ORAL

## 2020-05-26 NOTE — Telephone Encounter (Signed)
Scheduled appts per 12/1 sch msg. Pt declined print out of AVS

## 2020-05-26 NOTE — Patient Instructions (Signed)
[facility] Discharge Instructions for Patients Receiving Chemotherapy  Today you received the following chemotherapy agents: daratumumab.  To help prevent nausea and vomiting after your treatment, we encourage you to take your nausea medication as directed.   If you develop nausea and vomiting that is not controlled by your nausea medication, call the clinic.   BELOW ARE SYMPTOMS THAT SHOULD BE REPORTED IMMEDIATELY:  *FEVER GREATER THAN 100.5 F  *CHILLS WITH OR WITHOUT FEVER  NAUSEA AND VOMITING THAT IS NOT CONTROLLED WITH YOUR NAUSEA MEDICATION  *UNUSUAL SHORTNESS OF BREATH  *UNUSUAL BRUISING OR BLEEDING  TENDERNESS IN MOUTH AND THROAT WITH OR WITHOUT PRESENCE OF ULCERS  *URINARY PROBLEMS  *BOWEL PROBLEMS  UNUSUAL RASH Items with * indicate a potential emergency and should be followed up as soon as possible.  Feel free to call the clinic should you have any questions or concerns. The clinic phone number is [phone].  Please show the CHEMO ALERT CARD at check-in to the Emergency Department and triage nurse.   

## 2020-05-26 NOTE — Assessment & Plan Note (Addendum)
The patient appears to be in complete remission with negative MRD with last bone marrow evaluation We will proceed with treatment as scheduled now She is getting help from our special chemotherapy pharmacist to see if we can get assistance for her to pay for Pomalyst She remains on acyclovir for antimicrobial prophylaxis She will continue aspirin therapy for DVT prophylaxis She will continue calcium and vitamin D She has received long-term Zometa She is at risk of osteonecrosis of the jaw I would discontinue Zometa permanently  She is concerned about losing the grant to pay for Pomalyst She has reach out to our specialty pharmacy for help I also recommend she reach out to [facility] to see if they have special grant available to help her pay for her medications

## 2020-05-26 NOTE — Assessment & Plan Note (Signed)
She has acquired pancytopenia due to treatment She is not symptomatic Observe only

## 2020-05-27 DIAGNOSIS — Z599 Problem related to housing and economic circumstances, unspecified: Secondary | ICD-10-CM | POA: Insufficient documentation

## 2020-05-27 LAB — KAPPA/LAMBDA LIGHT CHAINS
Kappa free light chain: 20 mg/L — ABNORMAL HIGH (ref 3.3–19.4)
Kappa, lambda light chain ratio: 0.82 (ref 0.26–1.65)
Lambda free light chains: 24.5 mg/L (ref 5.7–26.3)

## 2020-05-27 NOTE — Progress Notes (Signed)
[place] OFFICE PROGRESS NOTE  Patient Care Team: [name], [name], MD as PCP - General (Internal Medicine) [name], MD as PCP - Cardiology (Cardiology) [name], MD as Referring Physician (Hematology and Oncology) [name], MD as Consulting Physician (Internal Medicine) [name], [name], MD as Consulting Physician (Infectious Diseases) [name], MD as Consulting Physician (Hematology and Oncology) [name], NP as Nurse Practitioner (Hematology and Oncology)  ASSESSMENT & PLAN:  Multiple myeloma [facility]) The patient appears to be in complete remission with negative MRD with last bone marrow evaluation We will proceed with treatment as scheduled now She is getting help from our special chemotherapy pharmacist to see if we can get assistance for her to pay for Pomalyst She remains on acyclovir for antimicrobial prophylaxis She will continue aspirin therapy for DVT prophylaxis She will continue calcium and vitamin D She has received long-term Zometa She is at risk of osteonecrosis of the jaw I would discontinue Zometa permanently  She is concerned about losing the grant to pay for Pomalyst She has reach out to our specialty pharmacy for help I also recommend she reach out to [facility] to see if they have special grant available to help her pay for her medications  Pancytopenia, acquired [facility]) She has acquired pancytopenia due to treatment She is not symptomatic Observe only  Financial difficulties As above, she is in the process of appealing to get help to pay for Pomalyst I recommend she also return to do to see if they have ability to help her pay for her medication   No orders of the defined types were placed in this encounter.   All questions were answered. The patient knows to call the clinic with any problems, questions or concerns. The total time spent in the appointment was 20 minutes  encounter with patients including review of chart and various tests results, discussions about plan of care and coordination of care plan   [name], MD 05/27/2020 9:40 AM  INTERVAL HISTORY: Please see below for problem oriented charting. She returns for further follow-up The patient is mildly distressed today because of the changes with the Pomalyst payment program, she might not be able to afford her prescription next year In the meantime, she denies recent infection No side effects from treatment No new bone pain  SUMMARY OF ONCOLOGIC HISTORY: Oncology History Overview Note  Multiple myeloma, Ig A Lambda, M spike 3.54 grams, Calcium 9.2, Creatinine 0.8, Beta 2 microglobulin 4.52, IgA 4840 mg/dL, lambda light chain 75.4, albumin 3.6, hemoglobin 9.7, platelet 115    Primary site: Multiple Myeloma   Staging method: AJCC 6th Edition   Clinical: Stage IIA signed by [name], MD on 11/07/2013  2:46 PM   Summary: Stage IIA      Multiple myeloma ([place])  10/31/2013 Bone Marrow Biopsy   Bone marrow biopsy confirmed multiple myeloma with 40% bone marrow involvement. Skeletal survey showed minimal lesions in her score with generalized demineralization   11/10/2013 - 02/13/2014 Chemotherapy   The patient is started on induction chemotherapy with weekly dexamethasone 40 mg by mouth as well as Velcade subcutaneous injection on days 1, 4, 8 and 11. On 11/21/2013, she was started on monthly Zometa.   12/23/2013 Adverse Reaction   The dose of Velcade was reduced due to thrombocytopenia.   01/28/2014 - 04/07/2014 Chemotherapy   Revlimid is added. Treatment was discontinued due to lack of response.   02/24/2014 - 04/07/2014 Chemotherapy  Due to worsening peripheral neuropathy, Velcade injection is changed to once a week. Revlimid was given 21 days on, 7 days off.   04/07/2014 - 04/10/2014 Chemotherapy   Revlimid was discontinued due to lack of response. Chemotherapy was changed back to Velcade  injection twice a week, 2 weeks on 1 week off. Her treatment was switched to to minimum response   04/20/2014 - 06/02/2014 Chemotherapy   chemotherapy is switched to Carfilzomib, Cytoxan and dexamethasone.   04/22/2014 Procedure   she has placement of port for chemotherapy.   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 at [facility]   10/20/2014 - 03/24/2015 Chemotherapy    she received chemotherapy with Kyprolis, Revlimid and dexamethasone   10/22/2014 Procedure   She has port placement   01/19/2015 Tumor Marker   IgA lambda M spike at 0.4 g    01/20/2015 Miscellaneous   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 No cource was found and fever resolved with IV vancomycin and meropenem   09/13/2015 Bone Marrow Biopsy   Bone marrow biopsy showed no increased blasts, 3-4 % plasma cells   03/02/2016 Bone Marrow Biopsy   Bone marrow biopsy at [facility] showed normocellular (30%) bone marrow with trilineage hematopoiesis. No significant increase in blasts. No significant increase in plasma cells.   05/12/2016 Imaging   DEXA scan at [place] showed osteopenia   10/24/2016 Imaging    Skeletal survey at [facility], no new lesions   12/07/2017 Imaging   No focal abnormality noted to suggest myeloma. Exam is stable from prior exam.   03/01/2018 Procedure   Successful 8 French right internal jugular vein power port placement with its tip at the SVC/RA junction.   03/06/2018 -  Chemotherapy   The patient had daratumumab    07/09/2018 Bone Marrow Biopsy   Bone marrow biopsy at [facility] showed residual disease at 0.004% plasma cells   07/03/2019 Bone Marrow Biopsy   A. Bone marrow, flow cytometric analysis for multiple myeloma minimal residual disease detection:   Negative. No phenotypically abnormal plasma cells at or above the limit of detection identified.     No monotypic B-cell population identified. Negative for increased blasts.   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   10/31/2015 Miscellaneous   She has engrafted     REVIEW OF SYSTEMS:   Constitutional: Denies fevers, chills or abnormal weight loss Eyes: Denies blurriness of vision Ears, nose, mouth, throat, and face: Denies mucositis or sore throat Respiratory: Denies cough, dyspnea or wheezes Cardiovascular: Denies palpitation, chest discomfort or lower extremity swelling Gastrointestinal:  Denies nausea, heartburn or change in bowel habits Skin: Denies abnormal skin rashes Lymphatics: Denies new lymphadenopathy or easy  bruising Neurological:Denies numbness, tingling or new weaknesses Behavioral/Psych: Mood is stable, no new changes  All other systems were reviewed with the patient and are negative.  I have reviewed the past medical history, past surgical history, social history and family history with the patient and they are unchanged from previous note.  ALLERGIES:  has No Known Allergies.  MEDICATIONS:  Current Outpatient Medications  Medication Sig Dispense Refill  . acyclovir (ZOVIRAX) 400 MG tablet TAKE 1 TABLET BY MOUTH TWICE A DAY 180 tablet 11  . aspirin 325 MG tablet Take 325 mg by mouth daily.    . calcipotriene (DOVONOX) 0.005 % cream daily.     . calcium carbonate (OS-CAL - DOSED IN MG OF ELEMENTAL CALCIUM) 1250 (500 Ca) MG tablet     . calcium carbonate (TUMS - DOSED IN MG ELEMENTAL CALCIUM) 500 MG chewable tablet Chew 1 tablet by mouth daily.    . Cholecalciferol (VITAMIN D-1000 MAX ST) 1000 units tablet Take 1,000 Units by mouth daily.    . cyanocobalamin 1000 MCG tablet Take 1,000 mcg by mouth daily.    Marland Kitchen DARATUMUMAB IV Inject into the vein every 30 (thirty) days.    Marland Kitchen docusate sodium (COLACE) 100 MG capsule Take 100 mg by mouth daily as needed for mild constipation.     Marland Kitchen estradiol (ESTRACE VAGINAL) 0.1 MG/GM vaginal cream Place 1 Applicatorful vaginally at bedtime. 42.5 g 12  . loperamide (IMODIUM) 2 MG capsule Take by mouth as needed for diarrhea or loose stools. As needed only    . metoprolol tartrate (LOPRESSOR) 25 MG tablet TAKE 1.5 TABLETS (37.5 MG TOTAL) BY MOUTH 2 (TWO) TIMES DAILY. 270 tablet 3  . mirtazapine (REMERON) 15 MG tablet Take 1 tablet (15 mg total) by mouth at bedtime. 90 tablet 1  . Multiple Vitamin (MULTIVITAMIN WITH MINERALS) TABS tablet Take 1 tablet by mouth daily.    . ondansetron (ZOFRAN) 8 MG tablet Take 1 tablet (8 mg total) by mouth every 8 (eight) hours as needed (Nausea or vomiting). 30 tablet 1  . pantoprazole (PROTONIX) 40 MG tablet TAKE 1 TABLET (40 MG  TOTAL) BY MOUTH 2 (TWO) TIMES DAILY. TAKE 30-60 MINUTES BEFORE BREAKFAST AND DINNER 180 tablet 2  . pomalidomide (POMALYST) 2 MG capsule Take 1 capsule (2 mg total) by mouth daily. Take with water on days 1-21. Repeat every 28 days. 21 capsule 0   No current facility-administered medications for this visit.    PHYSICAL EXAMINATION: ECOG PERFORMANCE STATUS: 1 - Symptomatic but completely ambulatory  Vitals:   05/26/20 1056  BP: 129/66  Pulse: 79  Resp: 18  Temp: 97.7 F (36.5 C)  SpO2: 100%   Filed Weights   05/26/20 1056  Weight: 160 lb 9.6 oz (72.8 kg)    GENERAL:alert, no distress and comfortable SKIN: skin color, texture, turgor are normal, no rashes or significant lesions EYES: normal, Conjunctiva are pink and non-injected, sclera clear OROPHARYNX:no exudate, no erythema and lips, buccal  mucosa, and tongue normal  NECK: supple, thyroid normal size, non-tender, without nodularity LYMPH:  no palpable lymphadenopathy in the cervical, axillary or inguinal LUNGS: clear to auscultation and percussion with normal breathing effort HEART: regular rate & rhythm and no murmurs and no lower extremity edema ABDOMEN:abdomen soft, non-tender and normal bowel sounds Musculoskeletal:no cyanosis of digits and no clubbing  NEURO: alert & oriented x 3 with fluent speech, no focal motor/sensory deficits  LABORATORY DATA:  I have reviewed the data as listed    Component Value Date/Time   NA 140 05/26/2020 1027   NA 142 01/04/2017 0902   K 3.8 05/26/2020 1027   K 3.9 01/04/2017 0902   CL 106 05/26/2020 1027   CO2 26 05/26/2020 1027   CO2 28 01/04/2017 0902   GLUCOSE 108 (H) 05/26/2020 1027   GLUCOSE 92 01/04/2017 0902   BUN 12 05/26/2020 1027   BUN 9.5 01/04/2017 0902   CREATININE 0.69 05/26/2020 1027   CREATININE 0.80 12/18/2018 0901   CREATININE 0.7 01/04/2017 0902   CALCIUM 8.6 (L) 05/26/2020 1027   CALCIUM 9.0 01/04/2017 0902   PROT 5.8 (L) 05/26/2020 1027   PROT 6.0 (L)  01/04/2017 0902   PROT 5.8 (L) 01/04/2017 0902   ALBUMIN 3.2 (L) 05/26/2020 1027   ALBUMIN 3.4 (L) 01/04/2017 0902   AST 10 (L) 05/26/2020 1027   AST 18 12/18/2018 0901   AST 21 01/04/2017 0902   ALT 7 05/26/2020 1027   ALT 14 12/18/2018 0901   ALT 16 01/04/2017 0902   ALKPHOS 75 05/26/2020 1027   ALKPHOS 101 01/04/2017 0902   BILITOT 0.3 05/26/2020 1027   BILITOT 0.3 12/18/2018 0901   BILITOT 0.56 01/04/2017 0902   GFRNONAA >60 05/26/2020 1027   GFRNONAA >60 12/18/2018 0901   GFRAA >60 03/25/2020 0917   GFRAA >60 12/18/2018 0901    No results found for: SPEP, UPEP  Lab Results  Component Value Date   WBC 4.5 05/26/2020   NEUTROABS 2.7 05/26/2020   HGB 11.7 (L) 05/26/2020   HCT 36.3 05/26/2020   MCV 107.4 (H) 05/26/2020   PLT 123 (L) 05/26/2020      Chemistry      Component Value Date/Time   NA 140 05/26/2020 1027   NA 142 01/04/2017 0902   K 3.8 05/26/2020 1027   K 3.9 01/04/2017 0902   CL 106 05/26/2020 1027   CO2 26 05/26/2020 1027   CO2 28 01/04/2017 0902   BUN 12 05/26/2020 1027   BUN 9.5 01/04/2017 0902   CREATININE 0.69 05/26/2020 1027   CREATININE 0.80 12/18/2018 0901   CREATININE 0.7 01/04/2017 0902      Component Value Date/Time   CALCIUM 8.6 (L) 05/26/2020 1027   CALCIUM 9.0 01/04/2017 0902   ALKPHOS 75 05/26/2020 1027   ALKPHOS 101 01/04/2017 0902   AST 10 (L) 05/26/2020 1027   AST 18 12/18/2018 0901   AST 21 01/04/2017 0902   ALT 7 05/26/2020 1027   ALT 14 12/18/2018 0901   ALT 16 01/04/2017 0902   BILITOT 0.3 05/26/2020 1027   BILITOT 0.3 12/18/2018 0901   BILITOT 0.56 01/04/2017 0902

## 2020-05-27 NOTE — Assessment & Plan Note (Signed)
As above, she is in the process of appealing to get help to pay for Pomalyst I recommend she also return to do to see if they have ability to help her pay for her medication

## 2020-05-28 ENCOUNTER — Telehealth: Payer: Self-pay

## 2020-05-28 LAB — MULTIPLE MYELOMA PANEL, SERUM
Albumin SerPl Elph-Mcnc: 3.2 g/dL (ref 2.9–4.4)
Albumin/Glob SerPl: 1.5 (ref 0.7–1.7)
Alpha 1: 0.2 g/dL (ref 0.0–0.4)
Alpha2 Glob SerPl Elph-Mcnc: 0.8 g/dL (ref 0.4–1.0)
B-Globulin SerPl Elph-Mcnc: 0.7 g/dL (ref 0.7–1.3)
Gamma Glob SerPl Elph-Mcnc: 0.5 g/dL (ref 0.4–1.8)
Globulin, Total: 2.2 g/dL (ref 2.2–3.9)
IgA: 210 mg/dL (ref 64–422)
IgG (Immunoglobin G), Serum: 423 mg/dL — ABNORMAL LOW (ref 586–1602)
IgM (Immunoglobulin M), Srm: 44 mg/dL (ref 26–217)
M Protein SerPl Elph-Mcnc: 0.1 g/dL — ABNORMAL HIGH
Total Protein ELP: 5.4 g/dL — ABNORMAL LOW (ref 6.0–8.5)

## 2020-05-28 NOTE — Telephone Encounter (Signed)
Called and given below message. She verbalized understanding. 

## 2020-05-28 NOTE — Telephone Encounter (Signed)
-----   Message from [name], MD sent at 05/28/2020 11:43 AM EST ----- Regarding: M protein Once in a while her M protein shows up We got 0.1 this week I know she is going back to [facility] next month. No new changes to Rx plan

## 2020-05-28 NOTE — Telephone Encounter (Signed)
A financial person at [facility] was able to obtain a $13000 [employer] Copay grant for Pomalyst. Start date 05/27/20 End date 05/27/21  New rx for Pomalyst and Copay grant info will need to be sent to [facility] as patient can no longer obtain Pomalyst at no charge through [facility] beginning in January.  LLS Info Bin: [id number] PCN: [id number] ID: [id number] Group: [id number]   Jean Davidson Patient [place] Phone [phone] Fax [phone] 05/28/2020 11:31 AM

## 2020-06-03 ENCOUNTER — Encounter: Payer: Self-pay | Admitting: Hematology and Oncology

## 2020-06-04 ENCOUNTER — Telehealth: Payer: Self-pay

## 2020-06-04 NOTE — Telephone Encounter (Signed)
I faxed the physician's diagnosis verification form to [employer] at [phone] per Leretha Dykes request.  [place] Patient [place] Phone [phone] Fax [phone] 06/04/2020 3:59 PM

## 2020-06-14 ENCOUNTER — Telehealth: Payer: Self-pay | Admitting: Cardiovascular Disease

## 2020-06-14 MED ORDER — METOPROLOL TARTRATE 25 MG PO TABS
ORAL_TABLET | ORAL | 0 refills | Status: DC
Start: 2020-06-14 — End: 2020-09-07

## 2020-06-14 NOTE — Telephone Encounter (Signed)
Requested Prescriptions   Signed Prescriptions Disp Refills   metoprolol tartrate (LOPRESSOR) 25 MG tablet 270 tablet 0    Sig: TAKE 1.5 TABLETS (37.5 MG TOTAL) BY MOUTH 2 (TWO) TIMES DAILY.    Authorizing Provider: Kathlyn Sacramento A    Ordering User: Raelene Bott, Merrill Deanda L

## 2020-06-14 NOTE — Telephone Encounter (Signed)
*  STAT* If patient is at the pharmacy, call can be transferred to refill team.   1. Which medications need to be refilled? (please list name of each medication and dose if known) metoprolol 25 mg  2. Which pharmacy/location (including street and city if local pharmacy) is medication to be sent to?[facility]  3. Do they need a 30 day or 90 day supply? [place]

## 2020-06-16 NOTE — Telephone Encounter (Signed)
.  mychart

## 2020-06-23 ENCOUNTER — Inpatient Hospital Stay: Payer: Medicare HMO

## 2020-06-23 ENCOUNTER — Other Ambulatory Visit: Payer: Self-pay

## 2020-06-23 DIAGNOSIS — D469 Myelodysplastic syndrome, unspecified: Secondary | ICD-10-CM

## 2020-06-23 DIAGNOSIS — C9000 Multiple myeloma not having achieved remission: Secondary | ICD-10-CM

## 2020-06-23 DIAGNOSIS — C9002 Multiple myeloma in relapse: Secondary | ICD-10-CM

## 2020-06-23 DIAGNOSIS — Z5112 Encounter for antineoplastic immunotherapy: Secondary | ICD-10-CM | POA: Diagnosis not present

## 2020-06-23 LAB — COMPREHENSIVE METABOLIC PANEL
ALT: 11 U/L (ref 0–44)
AST: 12 U/L — ABNORMAL LOW (ref 15–41)
Albumin: 3.5 g/dL (ref 3.5–5.0)
Alkaline Phosphatase: 68 U/L (ref 38–126)
Anion gap: 5 (ref 5–15)
BUN: 16 mg/dL (ref 8–23)
CO2: 27 mmol/L (ref 22–32)
Calcium: 8.9 mg/dL (ref 8.9–10.3)
Chloride: 108 mmol/L (ref 98–111)
Creatinine, Ser: 0.73 mg/dL (ref 0.44–1.00)
GFR, Estimated: >60 mL/min (ref >60)
Glucose, Bld: 100 mg/dL — ABNORMAL HIGH (ref 70–99)
Potassium: 4.1 mmol/L (ref 3.5–5.1)
Sodium: 140 mmol/L (ref 135–145)
Total Bilirubin: 0.3 mg/dL (ref 0.3–1.2)
Total Protein: 6.3 g/dL — ABNORMAL LOW (ref 6.5–8.1)

## 2020-06-23 LAB — CBC WITH DIFFERENTIAL/PLATELET
Abs Immature Granulocytes: 0.01 10*3/uL (ref 0.00–0.07)
Basophils Absolute: 0 10*3/uL (ref 0.0–0.1)
Basophils Relative: 1 %
Eosinophils Absolute: 0 10*3/uL (ref 0.0–0.5)
Eosinophils Relative: 0 %
HCT: 37.8 % (ref 36.0–46.0)
Hemoglobin: 12.3 g/dL (ref 12.0–15.0)
Immature Granulocytes: 0 %
Lymphocytes Relative: 49 %
Lymphs Abs: 1.8 10*3/uL (ref 0.7–4.0)
MCH: 34.5 pg — ABNORMAL HIGH (ref 26.0–34.0)
MCHC: 32.5 g/dL (ref 30.0–36.0)
MCV: 105.9 fL — ABNORMAL HIGH (ref 80.0–100.0)
Monocytes Absolute: 0.4 10*3/uL (ref 0.1–1.0)
Monocytes Relative: 10 %
Neutro Abs: 1.5 10*3/uL — ABNORMAL LOW (ref 1.7–7.7)
Neutrophils Relative %: 40 %
Platelets: 71 10*3/uL — ABNORMAL LOW (ref 150–400)
RBC: 3.57 MIL/uL — ABNORMAL LOW (ref 3.87–5.11)
RDW: 13.4 % (ref 11.5–15.5)
WBC: 3.7 10*3/uL — ABNORMAL LOW (ref 4.0–10.5)
nRBC: 0 % (ref 0.0–0.2)

## 2020-06-23 MED ORDER — SODIUM CHLORIDE 0.9% FLUSH
10.0000 mL | Freq: Once | INTRAVENOUS | Status: AC
  Administered 2020-06-23: 10 mL
  Filled 2020-06-23: qty 10

## 2020-06-23 MED ORDER — HEPARIN SOD (PORK) LOCK FLUSH 100 UNIT/ML IV SOLN
500.0000 [IU] | Freq: Once | INTRAVENOUS | Status: AC
  Administered 2020-06-23: 500 [IU]
  Filled 2020-06-23: qty 5

## 2020-06-23 NOTE — Progress Notes (Signed)
Platelets today are 71.  Dr. Arbutus Ped informed, pt is not to receive chemo today.

## 2020-06-23 NOTE — Patient Instructions (Signed)

## 2020-06-24 ENCOUNTER — Encounter: Payer: Self-pay | Admitting: Hematology and Oncology

## 2020-06-24 LAB — KAPPA/LAMBDA LIGHT CHAINS
Kappa free light chain: 16 mg/L (ref 3.3–19.4)
Kappa, lambda light chain ratio: 0.59 (ref 0.26–1.65)
Lambda free light chains: 27.2 mg/L — ABNORMAL HIGH (ref 5.7–26.3)

## 2020-06-26 LAB — MULTIPLE MYELOMA PANEL, SERUM
Albumin SerPl Elph-Mcnc: 3.4 g/dL (ref 2.9–4.4)
Albumin/Glob SerPl: 1.6 (ref 0.7–1.7)
Alpha 1: 0.2 g/dL (ref 0.0–0.4)
Alpha2 Glob SerPl Elph-Mcnc: 0.8 g/dL (ref 0.4–1.0)
B-Globulin SerPl Elph-Mcnc: 0.7 g/dL (ref 0.7–1.3)
Gamma Glob SerPl Elph-Mcnc: 0.5 g/dL (ref 0.4–1.8)
Globulin, Total: 2.2 g/dL (ref 2.2–3.9)
IgA: 241 mg/dL (ref 64–422)
IgG (Immunoglobin G), Serum: 480 mg/dL — ABNORMAL LOW (ref 586–1602)
IgM (Immunoglobulin M), Srm: 49 mg/dL (ref 26–217)
Total Protein ELP: 5.6 g/dL — ABNORMAL LOW (ref 6.0–8.5)

## 2020-06-29 ENCOUNTER — Other Ambulatory Visit: Payer: Self-pay | Admitting: Hematology and Oncology

## 2020-06-29 ENCOUNTER — Telehealth: Payer: Self-pay | Admitting: Hematology and Oncology

## 2020-06-29 ENCOUNTER — Encounter: Payer: Self-pay | Admitting: Hematology and Oncology

## 2020-06-29 DIAGNOSIS — C9002 Multiple myeloma in relapse: Secondary | ICD-10-CM

## 2020-06-29 MED ORDER — POMALIDOMIDE 2 MG PO CAPS: 2.0000 mg | ORAL_CAPSULE | Freq: Every day | ORAL | 11 refills | Status: DC

## 2020-06-29 NOTE — Telephone Encounter (Signed)
Rescheduled appointment per 1/4 schedule message. Patient is aware of changes. 

## 2020-07-05 ENCOUNTER — Telehealth: Payer: Self-pay

## 2020-07-05 ENCOUNTER — Encounter: Payer: Self-pay | Admitting: Hematology and Oncology

## 2020-07-05 ENCOUNTER — Other Ambulatory Visit: Payer: Self-pay | Admitting: Hematology and Oncology

## 2020-07-05 DIAGNOSIS — E538 Deficiency of other specified B group vitamins: Secondary | ICD-10-CM

## 2020-07-05 NOTE — Telephone Encounter (Signed)
Called and scheduled lab appt at 9 am tomorrow per her request. She is aware of appt time.

## 2020-07-05 NOTE — Telephone Encounter (Signed)
-----   Message from [name], MD sent at 07/05/2020  1:47 PM EST ----- Regarding: pls schedule B12 level to be drawn tomorrow, no need port, arm only

## 2020-07-06 ENCOUNTER — Other Ambulatory Visit: Payer: Self-pay

## 2020-07-06 ENCOUNTER — Inpatient Hospital Stay: Payer: Medicare HMO | Attending: Hematology and Oncology

## 2020-07-06 ENCOUNTER — Encounter: Payer: Self-pay | Admitting: Hematology and Oncology

## 2020-07-06 ENCOUNTER — Telehealth: Payer: Self-pay

## 2020-07-06 DIAGNOSIS — Z7952 Long term (current) use of systemic steroids: Secondary | ICD-10-CM | POA: Diagnosis not present

## 2020-07-06 DIAGNOSIS — Z79899 Other long term (current) drug therapy: Secondary | ICD-10-CM | POA: Insufficient documentation

## 2020-07-06 DIAGNOSIS — C9002 Multiple myeloma in relapse: Secondary | ICD-10-CM | POA: Diagnosis not present

## 2020-07-06 DIAGNOSIS — G629 Polyneuropathy, unspecified: Secondary | ICD-10-CM | POA: Diagnosis not present

## 2020-07-06 DIAGNOSIS — D469 Myelodysplastic syndrome, unspecified: Secondary | ICD-10-CM | POA: Insufficient documentation

## 2020-07-06 DIAGNOSIS — D696 Thrombocytopenia, unspecified: Secondary | ICD-10-CM | POA: Insufficient documentation

## 2020-07-06 DIAGNOSIS — E538 Deficiency of other specified B group vitamins: Secondary | ICD-10-CM

## 2020-07-06 DIAGNOSIS — M8580 Other specified disorders of bone density and structure, unspecified site: Secondary | ICD-10-CM | POA: Insufficient documentation

## 2020-07-06 LAB — VITAMIN B12: Vitamin B-12: 236 pg/mL (ref 180–914)

## 2020-07-06 NOTE — Telephone Encounter (Signed)
Called and given below message. She verbalized understanding. 

## 2020-07-06 NOTE — Telephone Encounter (Signed)
-----   Message from [name], MD sent at 07/06/2020 10:39 AM EST ----- Regarding: B12 is ok Tell her B12 is ok Next step is to wait for final bone marrow report

## 2020-07-12 ENCOUNTER — Encounter: Payer: Self-pay | Admitting: Hematology and Oncology

## 2020-07-12 ENCOUNTER — Telehealth: Payer: Self-pay | Admitting: Hematology and Oncology

## 2020-07-12 NOTE — Telephone Encounter (Signed)
Cancelled 1/18 appts per 1/17 staff msg, called and spoke with pt, confirmed cancelled appts

## 2020-07-13 ENCOUNTER — Inpatient Hospital Stay: Payer: Medicare HMO

## 2020-07-13 ENCOUNTER — Inpatient Hospital Stay: Payer: Medicare HMO | Admitting: Hematology and Oncology

## 2020-07-21 ENCOUNTER — Inpatient Hospital Stay: Payer: Medicare HMO

## 2020-07-21 ENCOUNTER — Encounter: Payer: Self-pay | Admitting: Hematology and Oncology

## 2020-07-21 ENCOUNTER — Other Ambulatory Visit: Payer: Self-pay

## 2020-07-21 ENCOUNTER — Inpatient Hospital Stay: Payer: Medicare HMO | Admitting: Hematology and Oncology

## 2020-07-21 ENCOUNTER — Telehealth: Payer: Self-pay | Admitting: Hematology and Oncology

## 2020-07-21 DIAGNOSIS — D696 Thrombocytopenia, unspecified: Secondary | ICD-10-CM | POA: Diagnosis not present

## 2020-07-21 DIAGNOSIS — D469 Myelodysplastic syndrome, unspecified: Secondary | ICD-10-CM

## 2020-07-21 DIAGNOSIS — D693 Immune thrombocytopenic purpura: Secondary | ICD-10-CM | POA: Diagnosis not present

## 2020-07-21 DIAGNOSIS — C9002 Multiple myeloma in relapse: Secondary | ICD-10-CM | POA: Diagnosis not present

## 2020-07-21 DIAGNOSIS — C9000 Multiple myeloma not having achieved remission: Secondary | ICD-10-CM

## 2020-07-21 DIAGNOSIS — C946 Myelodysplastic disease, not classified: Secondary | ICD-10-CM

## 2020-07-21 LAB — COMPREHENSIVE METABOLIC PANEL
ALT: 10 U/L (ref 0–44)
AST: 12 U/L — ABNORMAL LOW (ref 15–41)
Albumin: 3.6 g/dL (ref 3.5–5.0)
Alkaline Phosphatase: 75 U/L (ref 38–126)
Anion gap: 7 (ref 5–15)
BUN: 16 mg/dL (ref 8–23)
CO2: 26 mmol/L (ref 22–32)
Calcium: 9.1 mg/dL (ref 8.9–10.3)
Chloride: 107 mmol/L (ref 98–111)
Creatinine, Ser: 0.79 mg/dL (ref 0.44–1.00)
GFR, Estimated: >60 mL/min (ref >60)
Glucose, Bld: 102 mg/dL — ABNORMAL HIGH (ref 70–99)
Potassium: 4 mmol/L (ref 3.5–5.1)
Sodium: 140 mmol/L (ref 135–145)
Total Bilirubin: 0.3 mg/dL (ref 0.3–1.2)
Total Protein: 6.3 g/dL — ABNORMAL LOW (ref 6.5–8.1)

## 2020-07-21 LAB — CBC WITH DIFFERENTIAL/PLATELET
Abs Immature Granulocytes: 0 10*3/uL (ref 0.00–0.07)
Basophils Absolute: 0 10*3/uL (ref 0.0–0.1)
Basophils Relative: 0 %
Eosinophils Absolute: 0 10*3/uL (ref 0.0–0.5)
Eosinophils Relative: 0 %
HCT: 39.4 % (ref 36.0–46.0)
Hemoglobin: 12.8 g/dL (ref 12.0–15.0)
Immature Granulocytes: 0 %
Lymphocytes Relative: 37 %
Lymphs Abs: 2 10*3/uL (ref 0.7–4.0)
MCH: 34.2 pg — ABNORMAL HIGH (ref 26.0–34.0)
MCHC: 32.5 g/dL (ref 30.0–36.0)
MCV: 105.3 fL — ABNORMAL HIGH (ref 80.0–100.0)
Monocytes Absolute: 0.4 10*3/uL (ref 0.1–1.0)
Monocytes Relative: 8 %
Neutro Abs: 3 10*3/uL (ref 1.7–7.7)
Neutrophils Relative %: 55 %
Platelets: 28 10*3/uL — ABNORMAL LOW (ref 150–400)
RBC: 3.74 MIL/uL — ABNORMAL LOW (ref 3.87–5.11)
RDW: 13.1 % (ref 11.5–15.5)
WBC: 5.5 10*3/uL (ref 4.0–10.5)
nRBC: 0 % (ref 0.0–0.2)

## 2020-07-21 MED ORDER — SODIUM CHLORIDE 0.9% FLUSH
10.0000 mL | Freq: Once | INTRAVENOUS | Status: AC
  Administered 2020-07-21: 10 mL
  Filled 2020-07-21: qty 10

## 2020-07-21 MED ORDER — PREDNISONE 20 MG PO TABS: 60.0000 mg | ORAL_TABLET | Freq: Every day | ORAL | 0 refills | Status: DC

## 2020-07-21 NOTE — Telephone Encounter (Signed)
Scheduled appointments per 1/26 sch msg. Spoke to patient who is aware of appointments date and times.  °

## 2020-07-21 NOTE — Patient Instructions (Signed)

## 2020-07-22 ENCOUNTER — Encounter: Payer: Self-pay | Admitting: Hematology and Oncology

## 2020-07-22 LAB — KAPPA/LAMBDA LIGHT CHAINS
Kappa free light chain: 9.8 mg/L (ref 3.3–19.4)
Kappa, lambda light chain ratio: 0.37 (ref 0.26–1.65)
Lambda free light chains: 26.3 mg/L (ref 5.7–26.3)

## 2020-07-22 NOTE — Assessment & Plan Note (Signed)
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

## 2020-07-22 NOTE — Assessment & Plan Note (Signed)
I have reviewed pathology results from [place] extensively Specifically, I tried to review all the description of her bone marrow aspirate and biopsy From the myeloma standpoint, it appears that she is still in remission Unfortunately, myeloma panel was not drawn, and we have drawn it today, pending I do not see specific explanation why she has progressive thrombocytopenia I have attempted to call the transplant coordinator to connect with her transplant physician to discuss, awaiting further phone call back For now, I have canceled her treatment

## 2020-07-22 NOTE — Progress Notes (Signed)
[place] OFFICE PROGRESS NOTE  Patient Care Team: [name], [name], MD as PCP - General (Internal Medicine) [name], MD as PCP - Cardiology (Cardiology) [name], MD as Referring Physician (Hematology and Oncology) [name], MD as Consulting Physician (Internal Medicine) [name], [name], MD as Consulting Physician (Infectious Diseases) [name], MD as Consulting Physician (Hematology and Oncology) [name], NP as Nurse Practitioner (Hematology and Oncology)  ASSESSMENT & PLAN:  Multiple myeloma [facility]) I have reviewed pathology results from [place] extensively Specifically, I tried to review all the description of her bone marrow aspirate and biopsy From the myeloma standpoint, it appears that she is still in remission Unfortunately, myeloma panel was not drawn, and we have drawn it today, pending I do not see specific explanation why she has progressive thrombocytopenia I have attempted to call the transplant coordinator to connect with her transplant physician to discuss, awaiting further phone call back For now, I have canceled her treatment  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[place]) According to outside bone marrow biopsy, she is engrafted There is no explanation for her progressive thrombocytopenia 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 Sample to Blood Bank    Standing Status:   Standing    Number of Occurrences:   33    Standing Expiration Date:   07/22/2021    All questions were answered. The patient knows to call the clinic with any problems, questions or concerns. The total time spent in the appointment was 40 minutes encounter with patients including review of chart and various tests results, discussions about plan of care and coordination of care plan   [name], MD 07/22/2020 8:20 AM  INTERVAL HISTORY: Please see below for problem oriented charting. She returns for treatment and follow-up today Her treatment was placed on hold over a month ago when she was noted to have progressive thrombocytopenia She went to [facility] several weeks ago for her transplant evaluation including bone marrow aspirate and biopsy At the time of bone marrow biopsy, her platelet count has dropped to close to 50,000 Unfortunately, she was not able to meet with her transplant physician She had a very brief telephone call with a physician assistant who told her that she has no signs of recurrent cancer but would defer to her transplant physician for future follow-up The patient was frustrated and upset that she did not get a good explanation why her platelet count is low The patient denies any recent signs or symptoms of bleeding such as spontaneous epistaxis, hematuria or hematochezia. I attempted to review all the outside records to my best ability  SUMMARY OF ONCOLOGIC HISTORY: Oncology History Overview Note  Multiple myeloma, Ig A Lambda, M spike 3.54 grams, Calcium 9.2, Creatinine 0.8, Beta 2 microglobulin 4.52, IgA 4840 mg/dL, lambda light chain 75.4, albumin 3.6, hemoglobin 9.7, platelet 115    Primary site: Multiple Myeloma  Staging method: AJCC 6th Edition   Clinical: Stage IIA signed by [name], MD on 11/07/2013  2:46 PM   Summary: Stage IIA      Multiple myeloma ([place])  10/31/2013 Bone Marrow Biopsy   Bone  marrow biopsy confirmed multiple myeloma with 40% bone marrow involvement. Skeletal survey showed minimal lesions in her score with generalized demineralization   11/10/2013 - 02/13/2014 Chemotherapy   The patient is started on induction chemotherapy with weekly dexamethasone 40 mg by mouth as well as Velcade subcutaneous injection on days 1, 4, 8 and 11. On 11/21/2013, she was started on monthly Zometa.   12/23/2013 Adverse Reaction   The dose of Velcade was reduced due to thrombocytopenia.   01/28/2014 - 04/07/2014 Chemotherapy   Revlimid is added. Treatment was discontinued due to lack of response.   02/24/2014 - 04/07/2014 Chemotherapy   Due to worsening peripheral neuropathy, Velcade injection is changed to once a week. Revlimid was given 21 days on, 7 days off.   04/07/2014 - 04/10/2014 Chemotherapy   Revlimid was discontinued due to lack of response. Chemotherapy was changed back to Velcade injection twice a week, 2 weeks on 1 week off. Her treatment was switched to to minimum response   04/20/2014 - 06/02/2014 Chemotherapy   chemotherapy is switched to Carfilzomib, Cytoxan and dexamethasone.   04/22/2014 Procedure   she has placement of port for chemotherapy.   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 at [facility]   10/20/2014 - 03/24/2015 Chemotherapy    she received chemotherapy with Kyprolis, Revlimid and dexamethasone   10/22/2014 Procedure   She has port placement   01/19/2015 Tumor Marker   IgA lambda M spike at 0.4 g    01/20/2015 Miscellaneous   IVIG monthly was added for recurrent  infections   02/02/2015 Miscellaneous   She received GCSF for severe neutropenia   02/26/2015 Bone Marrow Biopsy    she had bone marrow biopsy done at [facility] which showed mild pancytopenia but not diagnostic for myelodysplastic syndrome or multiple myeloma   07/22/2015 - 09/21/2015 Chemotherapy   She is receiving Daratumumab at [facility] due to relapsed myeloma   08/03/2015 - 08/06/2015 Hospital Admission   She was admitted to the hospital for neutropenic fever. No cource was found and fever resolved with IV vancomycin and meropenem   09/13/2015 Bone Marrow Biopsy   Bone marrow biopsy showed no increased blasts, 3-4 % plasma cells   03/02/2016 Bone Marrow Biopsy   Bone marrow biopsy at [facility] showed normocellular (30%) bone marrow with trilineage hematopoiesis. No significant increase in blasts. No significant increase in plasma cells.   05/12/2016 Imaging   DEXA scan at [facility] showed osteopenia   10/24/2016 Imaging   Skeletal survey at [facility], no new lesions   12/07/2017 Imaging   No focal abnormality noted to suggest myeloma. Exam is stable from prior exam.   03/01/2018 Procedure   Successful 8 French right internal jugular vein power port placement with its tip at the SVC/RA junction.   03/06/2018 -  Chemotherapy   The patient had daratumumab    07/09/2018 Bone  Marrow Biopsy   Bone marrow biopsy at [facility] showed residual disease at 0.004% plasma cells   07/03/2019 Bone Marrow Biopsy   A. Bone marrow, flow cytometric analysis for multiple myeloma minimal residual disease detection:   Negative. No phenotypically abnormal plasma cells at or above the limit of detection identified.     No monotypic B-cell population identified. Negative for increased blasts.   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   10/31/2015 Miscellaneous   She has engrafted     REVIEW OF SYSTEMS:   Constitutional: Denies fevers, chills or abnormal weight loss Eyes: Denies blurriness of vision Ears, nose, mouth, throat, and face: Denies mucositis or sore throat Respiratory: Denies cough, dyspnea or wheezes Cardiovascular: Denies palpitation, chest discomfort or lower extremity swelling Gastrointestinal:  Denies nausea, heartburn or change in bowel habits Skin: Denies abnormal skin rashes Lymphatics: Denies new lymphadenopathy or easy bruising Neurological:Denies numbness, tingling or new weaknesses Behavioral/Psych: Mood is stable, no new changes  All other systems were reviewed with the patient and are negative.  I have reviewed the past medical history, past surgical history, social history and family history with the patient and they are unchanged from previous note.  ALLERGIES:  has No Known Allergies.  MEDICATIONS:  Current Outpatient Medications  Medication Sig Dispense Refill  . predniSONE (DELTASONE) 20 MG tablet Take 3 tablets (60 mg total) by mouth daily with breakfast. 90 tablet 0  . acyclovir (ZOVIRAX) 400 MG tablet TAKE 1 TABLET BY MOUTH TWICE A DAY 180 tablet 11  . calcipotriene (DOVONOX) 0.005 % cream daily.     . calcium carbonate (OS-CAL - DOSED IN MG OF ELEMENTAL CALCIUM) 1250 (500 Ca) MG tablet     . calcium carbonate (TUMS - DOSED IN MG ELEMENTAL CALCIUM) 500 MG chewable tablet Chew 1 tablet by mouth daily.    . Cholecalciferol (VITAMIN D-1000 MAX ST) 1000 units tablet Take 1,000 Units by mouth daily.     . cyanocobalamin 1000 MCG tablet Take 1,000 mcg by mouth daily.    Marland Kitchen DARATUMUMAB IV Inject into the vein every 30 (thirty) days.    Marland Kitchen docusate sodium (COLACE) 100 MG capsule Take 100 mg by mouth daily as needed for mild constipation.     Marland Kitchen estradiol (ESTRACE VAGINAL) 0.1 MG/GM vaginal cream Place 1 Applicatorful vaginally at bedtime. 42.5 g 12  . loperamide (IMODIUM) 2 MG capsule Take by mouth as needed for diarrhea or loose stools. As needed only    . metoprolol tartrate (LOPRESSOR) 25 MG tablet TAKE 1.5 TABLETS (37.5 MG TOTAL) BY MOUTH 2 (TWO) TIMES DAILY. 270 tablet 0  . mirtazapine (REMERON) 15 MG tablet Take 1 tablet (15 mg total) by mouth at bedtime. 90 tablet 1  . Multiple Vitamin (MULTIVITAMIN WITH MINERALS) TABS tablet Take 1 tablet by mouth daily.    . ondansetron (ZOFRAN) 8 MG tablet Take 1 tablet (8 mg total) by mouth every 8 (eight) hours as needed (Nausea or vomiting). 30 tablet 1  .  pantoprazole (PROTONIX) 40 MG tablet TAKE 1 TABLET (40 MG TOTAL) BY MOUTH 2 (TWO) TIMES DAILY. TAKE 30-60 MINUTES BEFORE BREAKFAST AND DINNER 180 tablet 2  . pomalidomide (POMALYST) 2 MG capsule Take 1 capsule (2 mg total) by mouth daily. Take with water on days 1-21. Repeat every 28 days. 21 capsule 11   No current facility-administered medications for this visit.    PHYSICAL EXAMINATION: ECOG PERFORMANCE STATUS: 0 - Asymptomatic  Vitals:   07/21/20 0937  BP: 130/77  Pulse: 79  Resp: 18  Temp: (!) 97 F (36.1 C)  SpO2: 94%   Filed Weights   07/21/20 0937  Weight: 157 lb (71.2 kg)    GENERAL:alert, no distress and comfortable NEURO: alert & oriented x 3 with fluent speech, no focal motor/sensory deficits  LABORATORY DATA:  I have reviewed the data as listed    Component Value Date/Time   NA 140 07/21/2020 0854   NA 142 01/04/2017 0902   K 4.0 07/21/2020 0854   K 3.9 01/04/2017 0902   CL 107 07/21/2020 0854   CO2 26 07/21/2020 0854   CO2 28 01/04/2017 0902   GLUCOSE 102 (H)  07/21/2020 0854   GLUCOSE 92 01/04/2017 0902   BUN 16 07/21/2020 0854   BUN 9.5 01/04/2017 0902   CREATININE 0.79 07/21/2020 0854   CREATININE 0.80 12/18/2018 0901   CREATININE 0.7 01/04/2017 0902   CALCIUM 9.1 07/21/2020 0854   CALCIUM 9.0 01/04/2017 0902   PROT 6.3 (L) 07/21/2020 0854   PROT 6.0 (L) 01/04/2017 0902   PROT 5.8 (L) 01/04/2017 0902   ALBUMIN 3.6 07/21/2020 0854   ALBUMIN 3.4 (L) 01/04/2017 0902   AST 12 (L) 07/21/2020 0854   AST 18 12/18/2018 0901   AST 21 01/04/2017 0902   ALT 10 07/21/2020 0854   ALT 14 12/18/2018 0901   ALT 16 01/04/2017 0902   ALKPHOS 75 07/21/2020 0854   ALKPHOS 101 01/04/2017 0902   BILITOT 0.3 07/21/2020 0854   BILITOT 0.3 12/18/2018 0901   BILITOT 0.56 01/04/2017 0902   GFRNONAA >60 07/21/2020 0854   GFRNONAA >60 12/18/2018 0901   GFRAA >60 03/25/2020 0917   GFRAA >60 12/18/2018 0901    No results found for: SPEP, UPEP  Lab Results  Component Value Date   WBC 5.5 07/21/2020   NEUTROABS 3.0 07/21/2020   HGB 12.8 07/21/2020   HCT 39.4 07/21/2020   MCV 105.3 (H) 07/21/2020   PLT 28 (L) 07/21/2020      Chemistry      Component Value Date/Time   NA 140 07/21/2020 0854   NA 142 01/04/2017 0902   K 4.0 07/21/2020 0854   K 3.9 01/04/2017 0902   CL 107 07/21/2020 0854   CO2 26 07/21/2020 0854   CO2 28 01/04/2017 0902   BUN 16 07/21/2020 0854   BUN 9.5 01/04/2017 0902   CREATININE 0.79 07/21/2020 0854   CREATININE 0.80 12/18/2018 0901   CREATININE 0.7 01/04/2017 0902      Component Value Date/Time   CALCIUM 9.1 07/21/2020 0854   CALCIUM 9.0 01/04/2017 0902   ALKPHOS 75 07/21/2020 0854   ALKPHOS 101 01/04/2017 0902   AST 12 (L) 07/21/2020 0854   AST 18 12/18/2018 0901   AST 21 01/04/2017 0902   ALT 10 07/21/2020 0854   ALT 14 12/18/2018 0901   ALT 16 01/04/2017 0902   BILITOT 0.3 07/21/2020 0854   BILITOT 0.3 12/18/2018 0901   BILITOT 0.56 01/04/2017 0902

## 2020-07-22 NOTE — Assessment & Plan Note (Signed)
She has progressive thrombocytopenia despite stopping chemotherapy more than a month ago Currently, she is not symptomatic I reminded the patient to stop aspirin There is no indication to transfuse unless her platelet count is less than 10,000 We discussed the risk and benefits of prednisone and she is in agreement I will see her back next week for further follow-up and review of blood count

## 2020-07-23 LAB — MULTIPLE MYELOMA PANEL, SERUM
Albumin SerPl Elph-Mcnc: 3.4 g/dL (ref 2.9–4.4)
Albumin/Glob SerPl: 1.5 (ref 0.7–1.7)
Alpha 1: 0.2 g/dL (ref 0.0–0.4)
Alpha2 Glob SerPl Elph-Mcnc: 0.8 g/dL (ref 0.4–1.0)
B-Globulin SerPl Elph-Mcnc: 0.8 g/dL (ref 0.7–1.3)
Gamma Glob SerPl Elph-Mcnc: 0.6 g/dL (ref 0.4–1.8)
Globulin, Total: 2.4 g/dL (ref 2.2–3.9)
IgA: 210 mg/dL (ref 64–422)
IgG (Immunoglobin G), Serum: 449 mg/dL — ABNORMAL LOW (ref 586–1602)
IgM (Immunoglobulin M), Srm: 43 mg/dL (ref 26–217)
M Protein SerPl Elph-Mcnc: 0.1 g/dL — ABNORMAL HIGH
Total Protein ELP: 5.8 g/dL — ABNORMAL LOW (ref 6.0–8.5)

## 2020-07-26 ENCOUNTER — Other Ambulatory Visit: Payer: Self-pay | Admitting: Internal Medicine

## 2020-07-26 ENCOUNTER — Other Ambulatory Visit: Payer: Self-pay | Admitting: Hematology and Oncology

## 2020-07-26 DIAGNOSIS — C9002 Multiple myeloma in relapse: Secondary | ICD-10-CM

## 2020-07-27 ENCOUNTER — Encounter: Payer: Self-pay | Admitting: Hematology and Oncology

## 2020-07-28 ENCOUNTER — Inpatient Hospital Stay: Payer: Medicare HMO | Admitting: Hematology and Oncology

## 2020-07-28 ENCOUNTER — Telehealth: Payer: Self-pay | Admitting: Hematology and Oncology

## 2020-07-28 ENCOUNTER — Other Ambulatory Visit: Payer: Self-pay | Admitting: Hematology and Oncology

## 2020-07-28 ENCOUNTER — Other Ambulatory Visit: Payer: Self-pay

## 2020-07-28 ENCOUNTER — Inpatient Hospital Stay: Payer: Medicare HMO | Attending: Hematology and Oncology

## 2020-07-28 DIAGNOSIS — D696 Thrombocytopenia, unspecified: Secondary | ICD-10-CM

## 2020-07-28 DIAGNOSIS — D469 Myelodysplastic syndrome, unspecified: Secondary | ICD-10-CM | POA: Insufficient documentation

## 2020-07-28 DIAGNOSIS — M8580 Other specified disorders of bone density and structure, unspecified site: Secondary | ICD-10-CM | POA: Insufficient documentation

## 2020-07-28 DIAGNOSIS — C9002 Multiple myeloma in relapse: Secondary | ICD-10-CM | POA: Insufficient documentation

## 2020-07-28 DIAGNOSIS — R739 Hyperglycemia, unspecified: Secondary | ICD-10-CM

## 2020-07-28 DIAGNOSIS — T50905A Adverse effect of unspecified drugs, medicaments and biological substances, initial encounter: Secondary | ICD-10-CM | POA: Diagnosis not present

## 2020-07-28 DIAGNOSIS — Z79899 Other long term (current) drug therapy: Secondary | ICD-10-CM | POA: Insufficient documentation

## 2020-07-28 DIAGNOSIS — G629 Polyneuropathy, unspecified: Secondary | ICD-10-CM | POA: Diagnosis not present

## 2020-07-28 DIAGNOSIS — R197 Diarrhea, unspecified: Secondary | ICD-10-CM | POA: Diagnosis not present

## 2020-07-28 DIAGNOSIS — C9000 Multiple myeloma not having achieved remission: Secondary | ICD-10-CM

## 2020-07-28 DIAGNOSIS — Z9221 Personal history of antineoplastic chemotherapy: Secondary | ICD-10-CM | POA: Diagnosis not present

## 2020-07-28 DIAGNOSIS — G4700 Insomnia, unspecified: Secondary | ICD-10-CM | POA: Diagnosis not present

## 2020-07-28 DIAGNOSIS — Z9484 Stem cells transplant status: Secondary | ICD-10-CM | POA: Diagnosis not present

## 2020-07-28 LAB — CBC WITH DIFFERENTIAL/PLATELET
Abs Immature Granulocytes: 0.03 10*3/uL (ref 0.00–0.07)
Basophils Absolute: 0 10*3/uL (ref 0.0–0.1)
Basophils Relative: 0 %
Eosinophils Absolute: 0 10*3/uL (ref 0.0–0.5)
Eosinophils Relative: 0 %
HCT: 38.7 % (ref 36.0–46.0)
Hemoglobin: 12.9 g/dL (ref 12.0–15.0)
Immature Granulocytes: 0 %
Lymphocytes Relative: 14 %
Lymphs Abs: 1.1 10*3/uL (ref 0.7–4.0)
MCH: 35 pg — ABNORMAL HIGH (ref 26.0–34.0)
MCHC: 33.3 g/dL (ref 30.0–36.0)
MCV: 104.9 fL — ABNORMAL HIGH (ref 80.0–100.0)
Monocytes Absolute: 0.2 10*3/uL (ref 0.1–1.0)
Monocytes Relative: 2 %
Neutro Abs: 6.6 10*3/uL (ref 1.7–7.7)
Neutrophils Relative %: 84 %
Platelets: 46 10*3/uL — ABNORMAL LOW (ref 150–400)
RBC: 3.69 MIL/uL — ABNORMAL LOW (ref 3.87–5.11)
RDW: 13.2 % (ref 11.5–15.5)
WBC: 8 10*3/uL (ref 4.0–10.5)
nRBC: 0 % (ref 0.0–0.2)

## 2020-07-28 LAB — COMPREHENSIVE METABOLIC PANEL
ALT: 17 U/L (ref 0–44)
AST: 12 U/L — ABNORMAL LOW (ref 15–41)
Albumin: 3.6 g/dL (ref 3.5–5.0)
Alkaline Phosphatase: 77 U/L (ref 38–126)
Anion gap: 7 (ref 5–15)
BUN: 18 mg/dL (ref 8–23)
CO2: 24 mmol/L (ref 22–32)
Calcium: 8.6 mg/dL — ABNORMAL LOW (ref 8.9–10.3)
Chloride: 109 mmol/L (ref 98–111)
Creatinine, Ser: 0.79 mg/dL (ref 0.44–1.00)
GFR, Estimated: >60 mL/min (ref >60)
Glucose, Bld: 174 mg/dL — ABNORMAL HIGH (ref 70–99)
Potassium: 3.9 mmol/L (ref 3.5–5.1)
Sodium: 140 mmol/L (ref 135–145)
Total Bilirubin: 0.3 mg/dL (ref 0.3–1.2)
Total Protein: 6.1 g/dL — ABNORMAL LOW (ref 6.5–8.1)

## 2020-07-28 LAB — SAMPLE TO BLOOD BANK

## 2020-07-28 LAB — SAVE SMEAR(SSMR), FOR PROVIDER SLIDE REVIEW

## 2020-07-28 NOTE — Telephone Encounter (Signed)
Scheduled appts per 2/2 sch msg. Pt declined print out of AVS

## 2020-07-29 ENCOUNTER — Encounter: Payer: Self-pay | Admitting: Hematology and Oncology

## 2020-07-29 DIAGNOSIS — R739 Hyperglycemia, unspecified: Secondary | ICD-10-CM | POA: Insufficient documentation

## 2020-07-29 DIAGNOSIS — T50905A Adverse effect of unspecified drugs, medicaments and biological substances, initial encounter: Secondary | ICD-10-CM | POA: Insufficient documentation

## 2020-07-29 NOTE — Assessment & Plan Note (Signed)
She has chronic insomnia and prednisone is making it worse She will continue Remeron

## 2020-07-29 NOTE — Progress Notes (Signed)
[place] OFFICE PROGRESS NOTE  Patient Care Team: [name], [name], MD as PCP - General (Internal Medicine) [name], MD as PCP - Cardiology (Cardiology) [name], MD as Referring Physician (Hematology and Oncology) [name], MD as Consulting Physician (Internal Medicine) [name], [name], MD as Consulting Physician (Infectious Diseases) [name], MD as Consulting Physician (Hematology and Oncology) [name], NP as Nurse Practitioner (Hematology and Oncology)  ASSESSMENT & PLAN:  Thrombocytopenia [facility]) She has reasonable response with prednisone Overall, 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 but given her history of transplant, she is currently scheduled to see transplant physician next week for further follow-up and work-up I recommend she continue prednisone therapy for now I plan to see her in 2 weeks for further follow-up I will follow up on her test results from [facility] next week  Hyperglycemia, drug-induced She is concerned about hypoglycemia We discussed briefly through my chart message about watching her diet Hopefully, the duration of prednisone is short-term  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   All questions were answered. The patient knows to call the clinic with any problems, questions or concerns. The total time spent in the appointment was 20 minutes encounter with patients including review of chart and various tests results, discussions about plan of care and coordination of care plan   [name], MD 07/29/2020 10:08 AM  INTERVAL HISTORY: Please see below for problem oriented charting. She returns for further follow-up following acute thrombocytopenia She was placed on prednisone which she tolerated well except for some mild insomnia She  noticed some mild bruising but no bleeding Her appetite is fair She has appointment to follow-up with [facility] next Monday  SUMMARY OF ONCOLOGIC HISTORY: Oncology History Overview Note  Multiple myeloma, Ig A Lambda, M spike 3.54 grams, Calcium 9.2, Creatinine 0.8, Beta 2 microglobulin 4.52, IgA 4840 mg/dL, lambda light chain 75.4, albumin 3.6, hemoglobin 9.7, platelet 115    Primary site: Multiple Myeloma   Staging method: AJCC 6th Edition   Clinical: Stage IIA signed by [name], MD on 11/07/2013  2:46 PM   Summary: Stage IIA      Multiple myeloma ([place])  10/31/2013 Bone Marrow Biopsy   Bone marrow biopsy confirmed multiple myeloma with 40% bone marrow involvement. Skeletal survey showed minimal lesions in her score with generalized demineralization   11/10/2013 - 02/13/2014 Chemotherapy   The patient is started on induction chemotherapy with weekly dexamethasone 40 mg by mouth as well as Velcade subcutaneous injection on days 1, 4, 8 and 11. On 11/21/2013, she was started on monthly Zometa.   12/23/2013 Adverse Reaction   The dose of Velcade was reduced due to thrombocytopenia.   01/28/2014 - 04/07/2014 Chemotherapy   Revlimid is added. Treatment was discontinued due to lack of response.   02/24/2014 - 04/07/2014 Chemotherapy   Due to worsening peripheral neuropathy, Velcade injection is changed to once a week. Revlimid was given 21 days on, 7 days off.   04/07/2014 - 04/10/2014 Chemotherapy   Revlimid was discontinued due to lack of response. Chemotherapy was changed back to Velcade injection twice a week, 2 weeks on 1 week off. Her treatment was switched to to minimum response   04/20/2014 - 06/02/2014 Chemotherapy   chemotherapy is switched to Carfilzomib, Cytoxan and dexamethasone.   04/22/2014  Procedure   she has placement of port for chemotherapy.   06/01/2014 Tumor Marker   Bloodwork show that she has greater than partial response   06/23/2014 Bone Marrow Biopsy   Bone marrow  biopsy show 5-10% residual plasma cells, normal cytogenetics and FISH   07/07/2014 Procedure   She had stem cell collection   07/22/2014 - 07/22/2014 Chemotherapy   She had high-dose chemotherapy with melphalan   07/23/2014 Bone Marrow Transplant   She had bone marrow transplant in autologous fashion at [facility]   10/20/2014 - 03/24/2015 Chemotherapy    she received chemotherapy with Kyprolis, Revlimid and dexamethasone   10/22/2014 Procedure   She has port placement   01/19/2015 Tumor Marker   IgA lambda M spike at 0.4 g    01/20/2015 Miscellaneous   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 No cource was found and fever resolved with IV vancomycin and meropenem   09/13/2015 Bone Marrow Biopsy   Bone marrow biopsy showed no increased blasts, 3-4 % plasma cells   03/02/2016 Bone Marrow Biopsy   Bone marrow biopsy at [facility] showed normocellular (30%) bone marrow with trilineage hematopoiesis. No significant increase in blasts. No significant increase in plasma cells.   05/12/2016 Imaging   DEXA scan at [place] showed osteopenia   10/24/2016 Imaging   Skeletal survey at [facility], no new lesions   12/07/2017 Imaging   No focal abnormality noted to suggest myeloma. Exam is stable from prior exam.   03/01/2018 Procedure   Successful 8 French right internal jugular vein power port placement with its tip at the SVC/RA junction.   03/06/2018 -  Chemotherapy   The patient had daratumumab    07/09/2018 Bone Marrow Biopsy   Bone marrow biopsy at [facility] showed  residual disease at 0.004% plasma cells   07/03/2019 Bone Marrow Biopsy   A. Bone marrow, flow cytometric analysis for multiple myeloma minimal residual disease detection:   Negative. No phenotypically abnormal plasma cells at or above the limit of detection identified.     No monotypic B-cell population identified. Negative for increased blasts.   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   10/31/2015 Miscellaneous   She has engrafted     REVIEW OF SYSTEMS:   Constitutional: Denies fevers, chills or abnormal weight loss Eyes: Denies blurriness of vision Ears, nose, mouth, throat, and face: Denies mucositis or sore throat Respiratory: Denies cough, dyspnea or wheezes Cardiovascular: Denies palpitation, chest discomfort or lower extremity swelling Gastrointestinal:  Denies nausea, heartburn or change in bowel habits Skin: Denies  abnormal skin rashes Lymphatics: Denies new lymphadenopathy  Neurological:Denies numbness, tingling or new weaknesses Behavioral/Psych: Mood is stable, no new changes  All other systems were reviewed with the patient and are negative.  I have reviewed the past medical history, past surgical history, social history and family history with the patient and they are unchanged from previous note.  ALLERGIES:  has No Known Allergies.  MEDICATIONS:  Current Outpatient  Medications  Medication Sig Dispense Refill  . acyclovir (ZOVIRAX) 400 MG tablet TAKE 1 TABLET BY MOUTH TWICE A DAY 180 tablet 11  . calcipotriene (DOVONOX) 0.005 % cream daily.     . calcium carbonate (OS-CAL - DOSED IN MG OF ELEMENTAL CALCIUM) 1250 (500 Ca) MG tablet     . calcium carbonate (TUMS - DOSED IN MG ELEMENTAL CALCIUM) 500 MG chewable tablet Chew 1 tablet by mouth daily.    . Cholecalciferol (VITAMIN D-1000 MAX ST) 1000 units tablet Take 1,000 Units by mouth daily.    . cyanocobalamin 1000 MCG tablet Take 1,000 mcg by mouth daily.    Marland Kitchen DARATUMUMAB IV Inject into the vein every 30 (thirty) days.    Marland Kitchen docusate sodium (COLACE) 100 MG capsule Take 100 mg by mouth daily as needed for mild constipation.     Marland Kitchen estradiol (ESTRACE VAGINAL) 0.1 MG/GM vaginal cream Place 1 Applicatorful vaginally at bedtime. 42.5 g 12  . loperamide (IMODIUM) 2 MG capsule Take by mouth as needed for diarrhea or loose stools. As needed only    . metoprolol tartrate (LOPRESSOR) 25 MG tablet TAKE 1.5 TABLETS (37.5 MG TOTAL) BY MOUTH 2 (TWO) TIMES DAILY. 270 tablet 0  . mirtazapine (REMERON) 15 MG tablet Take 1 tablet (15 mg total) by mouth at bedtime. 90 tablet 1  . Multiple Vitamin (MULTIVITAMIN WITH MINERALS) TABS tablet Take 1 tablet by mouth daily.    . ondansetron (ZOFRAN) 8 MG tablet Take 1 tablet (8 mg total) by mouth every 8 (eight) hours as needed (Nausea or vomiting). 30 tablet 1  . pantoprazole (PROTONIX) 40 MG tablet TAKE 1 TABLET (40 MG TOTAL) BY MOUTH 2 (TWO) TIMES DAILY. TAKE 30-60 MINUTES BEFORE BREAKFAST AND DINNER. 180 tablet 2  . pomalidomide (POMALYST) 2 MG capsule Take 1 capsule (2 mg total) by mouth daily. Take with water on days 1-21. Repeat every 28 days. 21 capsule 11  . predniSONE (DELTASONE) 20 MG tablet Take 3 tablets (60 mg total) by mouth daily with breakfast. 90 tablet 0   No current facility-administered medications for this visit.    PHYSICAL EXAMINATION: ECOG PERFORMANCE STATUS:  0 - Asymptomatic  Vitals:   07/28/20 1344  BP: 139/75  Pulse: 68  Resp: 17  Temp: 98.2 F (36.8 C)  SpO2: 98%   Filed Weights   07/28/20 1344  Weight: 161 lb 12.8 oz (73.4 kg)    GENERAL:alert, no distress and comfortable NEURO: alert & oriented x 3 with fluent speech, no focal motor/sensory deficits  LABORATORY DATA:  I have reviewed the data as listed    Component Value Date/Time   NA 140 07/28/2020 1321   NA 142 01/04/2017 0902   K 3.9 07/28/2020 1321   K 3.9 01/04/2017 0902   CL 109 07/28/2020 1321   CO2 24 07/28/2020 1321   CO2 28 01/04/2017 0902   GLUCOSE 174 (H) 07/28/2020 1321   GLUCOSE 92 01/04/2017 0902   BUN 18 07/28/2020 1321   BUN 9.5 01/04/2017 0902   CREATININE 0.79 07/28/2020 1321   CREATININE 0.80  12/18/2018 0901   CREATININE 0.7 01/04/2017 0902   CALCIUM 8.6 (L) 07/28/2020 1321   CALCIUM 9.0 01/04/2017 0902   PROT 6.1 (L) 07/28/2020 1321   PROT 6.0 (L) 01/04/2017 0902   PROT 5.8 (L) 01/04/2017 0902   ALBUMIN 3.6 07/28/2020 1321   ALBUMIN 3.4 (L) 01/04/2017 0902   AST 12 (L) 07/28/2020 1321   AST 18 12/18/2018 0901   AST 21 01/04/2017 0902   ALT 17 07/28/2020 1321   ALT 14 12/18/2018 0901   ALT 16 01/04/2017 0902   ALKPHOS 77 07/28/2020 1321   ALKPHOS 101 01/04/2017 0902   BILITOT 0.3 07/28/2020 1321   BILITOT 0.3 12/18/2018 0901   BILITOT 0.56 01/04/2017 0902   GFRNONAA >60 07/28/2020 1321   GFRNONAA >60 12/18/2018 0901   GFRAA >60 03/25/2020 0917   GFRAA >60 12/18/2018 0901    No results found for: SPEP, UPEP  Lab Results  Component Value Date   WBC 8.0 07/28/2020   NEUTROABS 6.6 07/28/2020   HGB 12.9 07/28/2020   HCT 38.7 07/28/2020   MCV 104.9 (H) 07/28/2020   PLT 46 (L) 07/28/2020      Chemistry      Component Value Date/Time   NA 140 07/28/2020 1321   NA 142 01/04/2017 0902   K 3.9 07/28/2020 1321   K 3.9 01/04/2017 0902   CL 109 07/28/2020 1321   CO2 24 07/28/2020 1321   CO2 28 01/04/2017 0902   BUN 18  07/28/2020 1321   BUN 9.5 01/04/2017 0902   CREATININE 0.79 07/28/2020 1321   CREATININE 0.80 12/18/2018 0901   CREATININE 0.7 01/04/2017 0902      Component Value Date/Time   CALCIUM 8.6 (L) 07/28/2020 1321   CALCIUM 9.0 01/04/2017 0902   ALKPHOS 77 07/28/2020 1321   ALKPHOS 101 01/04/2017 0902   AST 12 (L) 07/28/2020 1321   AST 18 12/18/2018 0901   AST 21 01/04/2017 0902   ALT 17 07/28/2020 1321   ALT 14 12/18/2018 0901   ALT 16 01/04/2017 0902   BILITOT 0.3 07/28/2020 1321   BILITOT 0.3 12/18/2018 0901   BILITOT 0.56 01/04/2017 0902

## 2020-07-29 NOTE — Assessment & Plan Note (Signed)
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

## 2020-07-29 NOTE — Assessment & Plan Note (Signed)
She is concerned about hypoglycemia We discussed briefly through my chart message about watching her diet Hopefully, the duration of prednisone is short-term

## 2020-08-03 ENCOUNTER — Encounter: Payer: Self-pay | Admitting: Hematology and Oncology

## 2020-08-03 ENCOUNTER — Other Ambulatory Visit: Payer: Self-pay | Admitting: Hematology and Oncology

## 2020-08-05 ENCOUNTER — Inpatient Hospital Stay (HOSPITAL_BASED_OUTPATIENT_CLINIC_OR_DEPARTMENT_OTHER): Payer: Medicare HMO | Admitting: Hematology and Oncology

## 2020-08-05 ENCOUNTER — Other Ambulatory Visit: Payer: Self-pay

## 2020-08-05 ENCOUNTER — Inpatient Hospital Stay: Payer: Medicare HMO

## 2020-08-05 ENCOUNTER — Encounter: Payer: Self-pay | Admitting: Hematology and Oncology

## 2020-08-05 VITALS — BP 142/84 | HR 71 | Temp 98.0°F | Resp 18 | Ht 62.0 in | Wt 151.6 lb

## 2020-08-05 DIAGNOSIS — D469 Myelodysplastic syndrome, unspecified: Secondary | ICD-10-CM

## 2020-08-05 DIAGNOSIS — R197 Diarrhea, unspecified: Secondary | ICD-10-CM | POA: Diagnosis not present

## 2020-08-05 DIAGNOSIS — D696 Thrombocytopenia, unspecified: Secondary | ICD-10-CM

## 2020-08-05 DIAGNOSIS — C9002 Multiple myeloma in relapse: Secondary | ICD-10-CM

## 2020-08-05 DIAGNOSIS — C946 Myelodysplastic disease, not classified: Secondary | ICD-10-CM

## 2020-08-05 DIAGNOSIS — C9000 Multiple myeloma not having achieved remission: Secondary | ICD-10-CM

## 2020-08-05 LAB — SAMPLE TO BLOOD BANK

## 2020-08-05 LAB — COMPREHENSIVE METABOLIC PANEL
ALT: 20 U/L (ref 0–44)
AST: 13 U/L — ABNORMAL LOW (ref 15–41)
Albumin: 3.8 g/dL (ref 3.5–5.0)
Alkaline Phosphatase: 60 U/L (ref 38–126)
Anion gap: 10 (ref 5–15)
BUN: 25 mg/dL — ABNORMAL HIGH (ref 8–23)
CO2: 18 mmol/L — ABNORMAL LOW (ref 22–32)
Calcium: 8.8 mg/dL — ABNORMAL LOW (ref 8.9–10.3)
Chloride: 113 mmol/L — ABNORMAL HIGH (ref 98–111)
Creatinine, Ser: 0.87 mg/dL (ref 0.44–1.00)
GFR, Estimated: >60 mL/min (ref >60)
Glucose, Bld: 110 mg/dL — ABNORMAL HIGH (ref 70–99)
Potassium: 3.6 mmol/L (ref 3.5–5.1)
Sodium: 141 mmol/L (ref 135–145)
Total Bilirubin: 0.4 mg/dL (ref 0.3–1.2)
Total Protein: 6.6 g/dL (ref 6.5–8.1)

## 2020-08-05 LAB — CBC WITH DIFFERENTIAL/PLATELET
Abs Immature Granulocytes: 0.04 10*3/uL (ref 0.00–0.07)
Basophils Absolute: 0 10*3/uL (ref 0.0–0.1)
Basophils Relative: 0 %
Eosinophils Absolute: 0 10*3/uL (ref 0.0–0.5)
Eosinophils Relative: 0 %
HCT: 43.5 % (ref 36.0–46.0)
Hemoglobin: 14 g/dL (ref 12.0–15.0)
Immature Granulocytes: 0 %
Lymphocytes Relative: 18 %
Lymphs Abs: 2.4 10*3/uL (ref 0.7–4.0)
MCH: 34 pg (ref 26.0–34.0)
MCHC: 32.2 g/dL (ref 30.0–36.0)
MCV: 105.6 fL — ABNORMAL HIGH (ref 80.0–100.0)
Monocytes Absolute: 1.1 10*3/uL — ABNORMAL HIGH (ref 0.1–1.0)
Monocytes Relative: 9 %
Neutro Abs: 9.3 10*3/uL — ABNORMAL HIGH (ref 1.7–7.7)
Neutrophils Relative %: 73 %
Platelets: 20 10*3/uL — ABNORMAL LOW (ref 150–400)
RBC: 4.12 MIL/uL (ref 3.87–5.11)
RDW: 13.3 % (ref 11.5–15.5)
WBC: 12.9 10*3/uL — ABNORMAL HIGH (ref 4.0–10.5)
nRBC: 0 % (ref 0.0–0.2)

## 2020-08-05 MED ORDER — SODIUM CHLORIDE 0.9% FLUSH
10.0000 mL | Freq: Once | INTRAVENOUS | Status: AC | PRN
  Administered 2020-08-05: 10 mL
  Filled 2020-08-05: qty 10

## 2020-08-05 MED ORDER — HEPARIN SOD (PORK) LOCK FLUSH 100 UNIT/ML IV SOLN
500.0000 [IU] | Freq: Once | INTRAVENOUS | Status: AC | PRN
Start: 2020-08-05 — End: 2020-08-05
  Administered 2020-08-05: 500 [IU]
  Filled 2020-08-05: qty 5

## 2020-08-05 MED ORDER — SODIUM CHLORIDE 0.9 % IV SOLN
Freq: Once | INTRAVENOUS | Status: AC
  Filled 2020-08-05: qty 250

## 2020-08-05 MED ORDER — DIPHENOXYLATE-ATROPINE 2.5-0.025 MG PO TABS
1.0000 | ORAL_TABLET | Freq: Four times a day (QID) | ORAL | 0 refills | Status: DC | PRN
Start: 2020-08-05 — End: 2020-08-05

## 2020-08-05 MED ORDER — DIPHENOXYLATE-ATROPINE 2.5-0.025 MG PO TABS: 1.0000 | ORAL_TABLET | Freq: Four times a day (QID) | ORAL | 0 refills | Status: DC | PRN

## 2020-08-05 NOTE — Assessment & Plan Note (Signed)
She had recent evaluation at [facility] Repeat biopsies pending It does not appear that this is consistent with ITP She has no response to prednisone I recommend discontinuation of prednisone She does not need transfusion support today I plan to bring her back next week for further follow-up and repeat platelet count I touch base with her team at [facility]; they are planning to get insurance authorization for IVIG hopefully soon

## 2020-08-05 NOTE — Assessment & Plan Note (Signed)
She has new onset of diarrhea Clinically, she felt dehydrated Her vital signs are stable Recommend IV fluids and antimotility agent Continue supportive care

## 2020-08-05 NOTE — Progress Notes (Signed)
[place] OFFICE PROGRESS NOTE  Patient Care Team: [name], [name], MD as PCP - General (Internal Medicine) [name], MD as PCP - Cardiology (Cardiology) [name], MD as Referring Physician (Hematology and Oncology) [name], MD as Consulting Physician (Internal Medicine) [name], [name], MD as Consulting Physician (Infectious Diseases) [name], MD as Consulting Physician (Hematology and Oncology) [name], NP as Nurse Practitioner (Hematology and Oncology)  ASSESSMENT & PLAN:  Multiple myeloma [facility]) Her recent myeloma evaluation showed that she is still in remission For now, all her treatment is placed on hold due to her severe thrombocytopenia Continue supportive care  Thrombocytopenia [facility]) She had recent evaluation at [facility] Repeat biopsies pending 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 SCHEDULING COMMUNICATION    Please schedule 3 hour infusion visit for fluids and hydration    All questions were answered. The patient knows to call the clinic with any problems, questions or concerns. The total time spent in the appointment was 30 minutes encounter with patients including review of chart and various tests results, discussions about plan of care and coordination of care plan   [name], MD 08/05/2020 1:21 PM  INTERVAL HISTORY: Please see below for problem  oriented charting. 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 Yesterday, she developed diarrhea after eating some salad Her diarrhea is profuse and she is not able to hydrate herself adequately No recent fever or chills  SUMMARY OF ONCOLOGIC HISTORY: Oncology History Overview Note  Multiple myeloma, Ig A Lambda, M spike 3.54 grams, Calcium 9.2, Creatinine 0.8, Beta 2 microglobulin 4.52, IgA 4840 mg/dL, lambda light chain 75.4, albumin 3.6, hemoglobin 9.7, platelet 115    Primary site: Multiple Myeloma   Staging method: AJCC 6th Edition   Clinical: Stage IIA signed by [name], MD on 11/07/2013  2:46 PM   Summary: Stage IIA      Multiple myeloma ([place])  10/31/2013 Bone Marrow Biopsy   Bone marrow biopsy confirmed multiple myeloma with 40% bone marrow involvement. Skeletal survey showed minimal lesions in her score with generalized demineralization   11/10/2013 - 02/13/2014 Chemotherapy   The patient is started on induction chemotherapy with weekly dexamethasone 40 mg by mouth as well as Velcade subcutaneous injection on days 1, 4, 8 and 11. On 11/21/2013, she was started on monthly Zometa.   12/23/2013 Adverse Reaction   The dose of Velcade was reduced due to thrombocytopenia.   01/28/2014 - 04/07/2014 Chemotherapy   Revlimid is added. Treatment was discontinued due to lack of response.   02/24/2014 - 04/07/2014 Chemotherapy   Due to worsening peripheral neuropathy, Velcade injection is changed to once a week. Revlimid was given 21  days on, 7 days off.   04/07/2014 - 04/10/2014 Chemotherapy   Revlimid was discontinued due to lack of response. Chemotherapy was changed back to Velcade injection twice a week, 2 weeks on  1 week off. Her treatment was switched to to minimum response   04/20/2014 - 06/02/2014 Chemotherapy   chemotherapy is switched to Carfilzomib, Cytoxan and dexamethasone.   04/22/2014 Procedure   she has placement of port for chemotherapy.   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 at [facility]   10/20/2014 - 03/24/2015 Chemotherapy    she received chemotherapy with Kyprolis, Revlimid and dexamethasone   10/22/2014 Procedure   She has port placement   01/19/2015 Tumor Marker   IgA lambda M spike at 0.4 g    01/20/2015 Miscellaneous   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 No cource was found and fever resolved with IV vancomycin and meropenem   09/13/2015 Bone Marrow Biopsy   Bone marrow biopsy showed no increased blasts, 3-4 % plasma cells   03/02/2016 Bone Marrow Biopsy   Bone marrow biopsy at [facility] showed normocellular (30%) bone marrow with trilineage hematopoiesis. No significant increase in blasts. No significant increase in plasma cells.   05/12/2016 Imaging   DEXA scan at [place] showed osteopenia   10/24/2016 Imaging   Skeletal survey at [facility], no new  lesions   12/07/2017 Imaging   No focal abnormality noted to suggest myeloma. Exam is stable from prior exam.   03/01/2018 Procedure   Successful 8 French right internal jugular vein power port placement with its tip at the SVC/RA junction.   03/06/2018 -  Chemotherapy   The patient had daratumumab    07/09/2018 Bone Marrow Biopsy   Bone marrow biopsy at [facility] showed residual disease at 0.004% plasma cells   07/03/2019 Bone Marrow Biopsy   A. Bone marrow, flow cytometric analysis for multiple myeloma minimal residual disease detection:   Negative. No phenotypically abnormal plasma cells at or above the limit of detection identified.     No monotypic B-cell population identified. Negative for increased blasts.   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   10/31/2015 Miscellaneous  She has engrafted     REVIEW OF SYSTEMS:   Constitutional: Denies fevers, chills or abnormal weight loss Eyes: Denies blurriness of vision Ears, nose, mouth, throat, and face: Denies mucositis or sore throat Respiratory: Denies cough, dyspnea or wheezes Cardiovascular: Denies palpitation, chest discomfort or lower extremity swelling Skin: Denies abnormal skin rashes Lymphatics: Denies new lymphadenopathy or easy bruising Neurological:Denies numbness, tingling or new weaknesses Behavioral/Psych: Mood is stable, no new  changes  All other systems were reviewed with the patient and are negative.  I have reviewed the past medical history, past surgical history, social history and family history with the patient and they are unchanged from previous note.  ALLERGIES:  has No Known Allergies.  MEDICATIONS:  Current Outpatient Medications  Medication Sig Dispense Refill  . acyclovir (ZOVIRAX) 400 MG tablet TAKE 1 TABLET BY MOUTH TWICE A DAY 180 tablet 11  . calcipotriene (DOVONOX) 0.005 % cream daily.     . calcium carbonate (OS-CAL - DOSED IN MG OF ELEMENTAL CALCIUM) 1250 (500 Ca) MG tablet     . calcium carbonate (TUMS - DOSED IN MG ELEMENTAL CALCIUM) 500 MG chewable tablet Chew 1 tablet by mouth daily.    . Cholecalciferol (VITAMIN D-1000 MAX ST) 1000 units tablet Take 1,000 Units by mouth daily.    . cyanocobalamin 1000 MCG tablet Take 1,000 mcg by mouth daily.    Marland Kitchen DARATUMUMAB IV Inject into the vein every 30 (thirty) days.    . diphenoxylate-atropine (LOMOTIL) 2.5-0.025 MG tablet Take 1 tablet by mouth 4 (four) times daily as needed for diarrhea or loose stools. 60 tablet 0  . docusate sodium (COLACE) 100 MG capsule Take 100 mg by mouth daily as needed for mild constipation.     Marland Kitchen estradiol (ESTRACE VAGINAL) 0.1 MG/GM vaginal cream Place 1 Applicatorful vaginally at bedtime. 42.5 g 12  . loperamide (IMODIUM) 2 MG capsule Take by mouth as needed for diarrhea or loose stools. As needed only    . metoprolol tartrate (LOPRESSOR) 25 MG tablet TAKE 1.5 TABLETS (37.5 MG TOTAL) BY MOUTH 2 (TWO) TIMES DAILY. 270 tablet 0  . mirtazapine (REMERON) 15 MG tablet Take 1 tablet (15 mg total) by mouth at bedtime. 90 tablet 1  . Multiple Vitamin (MULTIVITAMIN WITH MINERALS) TABS tablet Take 1 tablet by mouth daily.    . ondansetron (ZOFRAN) 8 MG tablet Take 1 tablet (8 mg total) by mouth every 8 (eight) hours as needed (Nausea or vomiting). 30 tablet 1  . pantoprazole (PROTONIX) 40 MG tablet TAKE 1 TABLET (40 MG TOTAL) BY  MOUTH 2 (TWO) TIMES DAILY. TAKE 30-60 MINUTES BEFORE BREAKFAST AND DINNER. 180 tablet 2   Current Facility-Administered Medications  Medication Dose Route Frequency Provider Last Rate Last Admin  . heparin lock flush 100 unit/mL  500 Units Intracatheter Once PRN Alvy Bimler, Ni, MD      . sodium chloride flush (NS) 0.9 % injection 10 mL  10 mL Intracatheter Once PRN [name], MD        PHYSICAL EXAMINATION: ECOG PERFORMANCE STATUS: 1 - Symptomatic but completely ambulatory  Vitals:   08/05/20 1121  BP: 129/76  Pulse: 64  Resp: 18  Temp: 98 F (36.7 C)  SpO2: 100%   Filed Weights   08/05/20 1121  Weight: 151 lb 9.6 oz (68.8 kg)    GENERAL:alert, no distress and comfortable Musculoskeletal:no cyanosis of digits and no clubbing  NEURO: alert & oriented x 3 with fluent speech, no focal motor/sensory deficits  LABORATORY  DATA:  I have reviewed the data as listed    Component Value Date/Time   NA 141 08/05/2020 1102   NA 142 01/04/2017 0902   K 3.6 08/05/2020 1102   K 3.9 01/04/2017 0902   CL 113 (H) 08/05/2020 1102   CO2 18 (L) 08/05/2020 1102   CO2 28 01/04/2017 0902   GLUCOSE 110 (H) 08/05/2020 1102   GLUCOSE 92 01/04/2017 0902   BUN 25 (H) 08/05/2020 1102   BUN 9.5 01/04/2017 0902   CREATININE 0.87 08/05/2020 1102   CREATININE 0.80 12/18/2018 0901   CREATININE 0.7 01/04/2017 0902   CALCIUM 8.8 (L) 08/05/2020 1102   CALCIUM 9.0 01/04/2017 0902   PROT 6.6 08/05/2020 1102   PROT 6.0 (L) 01/04/2017 0902   PROT 5.8 (L) 01/04/2017 0902   ALBUMIN 3.8 08/05/2020 1102   ALBUMIN 3.4 (L) 01/04/2017 0902   AST 13 (L) 08/05/2020 1102   AST 18 12/18/2018 0901   AST 21 01/04/2017 0902   ALT 20 08/05/2020 1102   ALT 14 12/18/2018 0901   ALT 16 01/04/2017 0902   ALKPHOS 60 08/05/2020 1102   ALKPHOS 101 01/04/2017 0902   BILITOT 0.4 08/05/2020 1102   BILITOT 0.3 12/18/2018 0901   BILITOT 0.56 01/04/2017 0902   GFRNONAA >60 08/05/2020 1102   GFRNONAA >60 12/18/2018 0901    GFRAA >60 03/25/2020 0917   GFRAA >60 12/18/2018 0901    No results found for: SPEP, UPEP  Lab Results  Component Value Date   WBC 12.9 (H) 08/05/2020   NEUTROABS 9.3 (H) 08/05/2020   HGB 14.0 08/05/2020   HCT 43.5 08/05/2020   MCV 105.6 (H) 08/05/2020   PLT 20 (L) 08/05/2020      Chemistry      Component Value Date/Time   NA 141 08/05/2020 1102   NA 142 01/04/2017 0902   K 3.6 08/05/2020 1102   K 3.9 01/04/2017 0902   CL 113 (H) 08/05/2020 1102   CO2 18 (L) 08/05/2020 1102   CO2 28 01/04/2017 0902   BUN 25 (H) 08/05/2020 1102   BUN 9.5 01/04/2017 0902   CREATININE 0.87 08/05/2020 1102   CREATININE 0.80 12/18/2018 0901   CREATININE 0.7 01/04/2017 0902      Component Value Date/Time   CALCIUM 8.8 (L) 08/05/2020 1102   CALCIUM 9.0 01/04/2017 0902   ALKPHOS 60 08/05/2020 1102   ALKPHOS 101 01/04/2017 0902   AST 13 (L) 08/05/2020 1102   AST 18 12/18/2018 0901   AST 21 01/04/2017 0902   ALT 20 08/05/2020 1102   ALT 14 12/18/2018 0901   ALT 16 01/04/2017 0902   BILITOT 0.4 08/05/2020 1102   BILITOT 0.3 12/18/2018 0901   BILITOT 0.56 01/04/2017 0902

## 2020-08-05 NOTE — Progress Notes (Signed)
Tolerated IV fluids with no complaints. Drinking with no problems. Vital signs obtained and stable prior to d/c.

## 2020-08-05 NOTE — Assessment & Plan Note (Signed)
Her recent myeloma evaluation showed that she is still in remission For now, all her treatment is placed on hold due to her severe thrombocytopenia Continue supportive care

## 2020-08-05 NOTE — Patient Instructions (Signed)
Dehydration, Adult Dehydration is a condition in which there is not enough water or other fluids in the body. This happens when a person loses more fluids than he or she takes in. Important organs, such as the kidneys, brain, and heart, cannot function without a proper amount of fluids. Any loss of fluids from the body can lead to dehydration. Dehydration can be mild, moderate, or severe. It should be treated right away to prevent it from becoming severe. What are the causes? Dehydration may be caused by:  Conditions that cause loss of water or other fluids, such as diarrhea, vomiting, or sweating or urinating a lot.  Not drinking enough fluids, especially when you are ill or doing activities that require a lot of energy.  Other illnesses and conditions, such as fever or infection.  Certain medicines, such as medicines that remove excess fluid from the body (diuretics).  Lack of safe drinking water.  Not being able to get enough water and food. What increases the risk? The following factors may make you more likely to develop this condition:  Having a long-term (chronic) illness that has not been treated properly, such as diabetes, heart disease, or kidney disease.  Being 65 years of age or older.  Having a disability.  Living in a place that is high in altitude, where thinner, drier air causes more fluid loss.  Doing exercises that put stress on your body for a long time (endurance sports). What are the signs or symptoms? Symptoms of dehydration depend on how severe it is. Mild or moderate dehydration  Thirst.  Dry lips or dry mouth.  Dizziness or light-headedness, especially when standing up from a seated position.  Muscle cramps.  Dark urine. Urine may be the color of tea.  Less urine or tears produced than usual.  Headache. Severe dehydration  Changes in skin. Your skin may be cold and clammy, blotchy, or pale. Your skin also may not return to normal after being  lightly pinched and released.  Little or no tears, urine, or sweat.  Changes in vital signs, such as rapid breathing and low blood pressure. Your pulse may be weak or may be faster than 100 beats a minute when you are sitting still.  Other changes, such as: ? Feeling very thirsty. ? Sunken eyes. ? Cold hands and feet. ? Confusion. ? Being very tired (lethargic) or having trouble waking from sleep. ? Short-term weight loss. ? Loss of consciousness. How is this diagnosed? This condition is diagnosed based on your symptoms and a physical exam. You may have blood and urine tests to help confirm the diagnosis. How is this treated? Treatment for this condition depends on how severe it is. Treatment should be started right away. Do not wait until dehydration becomes severe. Severe dehydration is an emergency and needs to be treated in a hospital.  Mild or moderate dehydration can be treated at home. You may be asked to: ? Drink more fluids. ? Drink an oral rehydration solution (ORS). This drink helps restore proper amounts of fluids and salts and minerals in the blood (electrolytes).  Severe dehydration can be treated: ? With IV fluids. ? By correcting abnormal levels of electrolytes. This is often done by giving electrolytes through a tube that is passed through your nose and into your stomach (nasogastric tube, or NG tube). ? By treating the underlying cause of dehydration. Follow these instructions at home: Oral rehydration solution If told by your health care provider, drink an ORS:  Make   an ORS by following instructions on the package.  Start by drinking small amounts, about  cup (120 mL) every 5-10 minutes.  Slowly increase how much you drink until you have taken the amount recommended by your health care provider. Eating and drinking  Drink enough clear fluid to keep your urine pale yellow. If you were told to drink an ORS, finish the ORS first and then start slowly drinking  other clear fluids. Drink fluids such as: ? Water. Do not drink only water. Doing that can lead to hyponatremia, which is having too little salt (sodium) in the body. ? Water from ice chips you suck on. ? Fruit juice that you have added water to (diluted fruit juice). ? Low-calorie sports drinks.  Eat foods that contain a healthy balance of electrolytes, such as bananas, oranges, potatoes, tomatoes, and spinach.  Do not drink alcohol.  Avoid the following: ? Drinks that contain a lot of sugar. These include high-calorie sports drinks, fruit juice that is not diluted, and soda. ? Caffeine. ? Foods that are greasy or contain a lot of fat or sugar.         General instructions  Take over-the-counter and prescription medicines only as told by your health care provider.  Do not take sodium tablets. Doing that can lead to having too much sodium in the body (hypernatremia).  Return to your normal activities as told by your health care provider. Ask your health care provider what activities are safe for you.  Keep all follow-up visits as told by your health care provider. This is important. Contact a health care provider if:  You have muscle cramps, pain, or discomfort, such as: ? Pain in your abdomen and the pain gets worse or stays in one area (localizes). ? Stiff neck.  You have a rash.  You are more irritable than usual.  You are sleepier or have a harder time waking than usual.  You feel weak or dizzy.  You feel very thirsty. Get help right away if you have:  Any symptoms of severe dehydration.  Symptoms of vomiting, such as: ? You cannot eat or drink without vomiting. ? Vomiting gets worse or does not go away. ? Vomit includes blood or green matter (bile).  Symptoms that get worse with treatment.  A fever.  A severe headache.  Problems with urination or bowel movements, such as: ? Diarrhea that gets worse or does not go away. ? Blood in your stool (feces).  This may cause stool to look black and tarry. ? Not urinating, or urinating only a small amount of very dark urine, within 6-8 hours.  Trouble breathing. These symptoms may represent a serious problem that is an emergency. Do not wait to see if the symptoms will go away. Get medical help right away. Call your local emergency services (911 in the [place] Do not drive yourself to the hospital. Summary  Dehydration is a condition in which there is not enough water or other fluids in the body. This happens when a person loses more fluids than he or she takes in.  Treatment for this condition depends on how severe it is. Treatment should be started right away. Do not wait until dehydration becomes severe.  Drink enough clear fluid to keep your urine pale yellow. If you were told to drink an oral rehydration solution (ORS), finish the ORS first and then start slowly drinking other clear fluids.  Take over-the-counter and prescription medicines only as told by your health   care provider.  Get help right away if you have any symptoms of severe dehydration. This information is not intended to replace advice given to you by your health care provider. Make sure you discuss any questions you have with your health care provider. Document Revised: 01/23/2019 Document Reviewed: 01/23/2019 Elsevier Patient Education  2021 [employer]  

## 2020-08-06 ENCOUNTER — Encounter: Payer: Self-pay | Admitting: Hematology and Oncology

## 2020-08-06 ENCOUNTER — Telehealth: Payer: Self-pay

## 2020-08-06 NOTE — Telephone Encounter (Signed)
Called and canceled appts on 2/15. She verbalized understanding. She will call back once she knows how [facility] will do follow ups.

## 2020-08-10 ENCOUNTER — Ambulatory Visit: Payer: Medicare HMO | Admitting: Hematology and Oncology

## 2020-08-10 ENCOUNTER — Encounter: Payer: Self-pay | Admitting: Hematology and Oncology

## 2020-08-10 ENCOUNTER — Other Ambulatory Visit: Payer: Medicare HMO

## 2020-08-11 ENCOUNTER — Telehealth: Payer: Self-pay | Admitting: Hematology and Oncology

## 2020-08-11 NOTE — Telephone Encounter (Signed)
Scheduled appt per 2/15 sch msg - pt is aware of apt date and time on 2/21

## 2020-08-14 ENCOUNTER — Other Ambulatory Visit: Payer: Self-pay | Admitting: Hematology and Oncology

## 2020-08-16 ENCOUNTER — Inpatient Hospital Stay: Payer: Medicare HMO

## 2020-08-16 ENCOUNTER — Encounter: Payer: Self-pay | Admitting: Hematology and Oncology

## 2020-08-16 ENCOUNTER — Inpatient Hospital Stay: Payer: Medicare HMO | Admitting: Hematology and Oncology

## 2020-08-16 ENCOUNTER — Other Ambulatory Visit: Payer: Self-pay

## 2020-08-16 DIAGNOSIS — C9000 Multiple myeloma not having achieved remission: Secondary | ICD-10-CM

## 2020-08-16 DIAGNOSIS — C9002 Multiple myeloma in relapse: Secondary | ICD-10-CM | POA: Diagnosis not present

## 2020-08-16 DIAGNOSIS — D696 Thrombocytopenia, unspecified: Secondary | ICD-10-CM

## 2020-08-16 LAB — COMPREHENSIVE METABOLIC PANEL
ALT: 27 U/L (ref 0–44)
AST: 27 U/L (ref 15–41)
Albumin: 3.7 g/dL (ref 3.5–5.0)
Alkaline Phosphatase: 69 U/L (ref 38–126)
Anion gap: 8 (ref 5–15)
BUN: 25 mg/dL — ABNORMAL HIGH (ref 8–23)
CO2: 26 mmol/L (ref 22–32)
Calcium: 9.1 mg/dL (ref 8.9–10.3)
Chloride: 104 mmol/L (ref 98–111)
Creatinine, Ser: 0.83 mg/dL (ref 0.44–1.00)
GFR, Estimated: >60 mL/min (ref >60)
Glucose, Bld: 92 mg/dL (ref 70–99)
Potassium: 3.7 mmol/L (ref 3.5–5.1)
Sodium: 138 mmol/L (ref 135–145)
Total Bilirubin: 1.1 mg/dL (ref 0.3–1.2)
Total Protein: 8.3 g/dL — ABNORMAL HIGH (ref 6.5–8.1)

## 2020-08-16 LAB — CBC WITH DIFFERENTIAL/PLATELET
Abs Immature Granulocytes: 0.03 10*3/uL (ref 0.00–0.07)
Basophils Absolute: 0 10*3/uL (ref 0.0–0.1)
Basophils Relative: 0 %
Eosinophils Absolute: 0 10*3/uL (ref 0.0–0.5)
Eosinophils Relative: 0 %
HCT: 29.5 % — ABNORMAL LOW (ref 36.0–46.0)
Hemoglobin: 10.3 g/dL — ABNORMAL LOW (ref 12.0–15.0)
Immature Granulocytes: 0 %
Lymphocytes Relative: 31 %
Lymphs Abs: 2.8 10*3/uL (ref 0.7–4.0)
MCH: 36.4 pg — ABNORMAL HIGH (ref 26.0–34.0)
MCHC: 34.9 g/dL (ref 30.0–36.0)
MCV: 104.2 fL — ABNORMAL HIGH (ref 80.0–100.0)
Monocytes Absolute: 0.8 10*3/uL (ref 0.1–1.0)
Monocytes Relative: 9 %
Neutro Abs: 5.4 10*3/uL (ref 1.7–7.7)
Neutrophils Relative %: 60 %
Platelets: 134 10*3/uL — ABNORMAL LOW (ref 150–400)
RBC: 2.83 MIL/uL — ABNORMAL LOW (ref 3.87–5.11)
RDW: 17 % — ABNORMAL HIGH (ref 11.5–15.5)
WBC: 9 10*3/uL (ref 4.0–10.5)
nRBC: 0.2 % (ref 0.0–0.2)

## 2020-08-16 LAB — SAMPLE TO BLOOD BANK

## 2020-08-16 NOTE — Progress Notes (Signed)
[facility] OFFICE PROGRESS NOTE  Patient Care Team: [name], [name], MD as PCP - General (Internal Medicine) [name], MD as PCP - Cardiology (Cardiology) [name], MD as Referring Physician (Hematology and Oncology) [name], MD as Consulting Physician (Internal Medicine) [name], [name], MD as Consulting Physician (Infectious Diseases) [name], MD as Consulting Physician (Hematology and Oncology) [name], NP as Nurse Practitioner (Hematology and Oncology)  ASSESSMENT & PLAN:  Thrombocytopenia [facility]) 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 She had no response to prednisone therapy She received IVIG treatment for 4 days at [facility] from 2/14 to 2/18 with positive response to treatment She does not know the plan from [place] I recommend she call to arrange follow-up I will bring her back weekly for the next 2 weeks to recheck her CBC We discussed approach to ITP in general For patients was steroid refractory, I typically do not prescribe IVIG long-term Promacta or Nplate can be considered but both can cause risk of bone marrow fibrosis I am not in favor of prescribing rituximab due to risk of severe lymphopenia long-term She will contact her physician at [facility] for further recommendation   Multiple myeloma [facility]) Her recent bone marrow aspirate and biopsy did not show evidence of active myeloma Her treatment will be placed on hold indefinitely pending further recommendation from [place] regarding her ITP situation   No orders of the defined types were placed in this encounter.   All questions were answered. The patient knows to call the clinic with any problems, questions or concerns. The total time spent in the appointment was 20 minutes encounter with patients including review of chart and various tests results, discussions about plan of care and coordination of care plan   [name],  MD 08/16/2020 2:33 PM  INTERVAL HISTORY: Please see below for problem oriented charting. She returns for further follow-up She is doing well No recent bleeding She is tired from driving back and forth [place] She does not know the plan from [place]: Oncology History Overview Note  Multiple myeloma, Ig A Lambda, M spike 3.54 grams, Calcium 9.2, Creatinine 0.8, Beta 2 microglobulin 4.52, IgA 4840 mg/dL, lambda light chain 75.4, albumin 3.6, hemoglobin 9.7, platelet 115    Primary site: Multiple Myeloma   Staging method: AJCC 6th Edition   Clinical: Stage IIA signed by [name], MD on 11/07/2013  2:46 PM   Summary: Stage IIA      Multiple myeloma ([place])  10/31/2013 Bone Marrow Biopsy   Bone marrow biopsy confirmed multiple myeloma with 40% bone marrow involvement. Skeletal survey showed minimal lesions in her score with generalized demineralization   11/10/2013 - 02/13/2014 Chemotherapy   The patient is started on induction chemotherapy with weekly dexamethasone 40 mg by mouth as well as Velcade subcutaneous injection on days 1, 4, 8 and 11. On 11/21/2013, she was started on monthly Zometa.   12/23/2013 Adverse Reaction   The dose of Velcade was reduced due to thrombocytopenia.   01/28/2014 - 04/07/2014 Chemotherapy   Revlimid is added. Treatment was discontinued due to lack of response.   02/24/2014 - 04/07/2014 Chemotherapy   Due to worsening peripheral neuropathy, Velcade injection is changed to once a week. Revlimid was given 21 days on, 7 days off.   04/07/2014 - 04/10/2014 Chemotherapy   Revlimid was discontinued due to lack of response. Chemotherapy was changed back to Velcade injection twice  a week, 2 weeks on 1 week off. Her treatment was switched to to minimum response   04/20/2014 - 06/02/2014 Chemotherapy   chemotherapy is switched to Carfilzomib, Cytoxan and dexamethasone.   04/22/2014 Procedure   she has placement of port for chemotherapy.    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 at [facility]   10/20/2014 - 03/24/2015 Chemotherapy    she received chemotherapy with Kyprolis, Revlimid and dexamethasone   10/22/2014 Procedure   She has port placement   01/19/2015 Tumor Marker   IgA lambda M spike at 0.4 g    01/20/2015 Miscellaneous   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 No cource was found and fever resolved with IV vancomycin and meropenem   09/13/2015 Bone Marrow Biopsy   Bone marrow biopsy showed no increased blasts, 3-4 % plasma cells   03/02/2016 Bone Marrow Biopsy   Bone marrow biopsy at [facility] showed normocellular (30%) bone marrow with trilineage hematopoiesis. No significant increase in blasts. No significant increase in plasma cells.   05/12/2016 Imaging   DEXA scan at [place] showed osteopenia   10/24/2016 Imaging   Skeletal survey at [facility], no new lesions   12/07/2017 Imaging   No focal abnormality noted to suggest myeloma. Exam is stable from prior exam.   03/01/2018 Procedure   Successful 8 French right internal jugular vein power port placement with its tip at the SVC/RA junction.    03/06/2018 -  Chemotherapy   The patient had daratumumab    07/09/2018 Bone Marrow Biopsy   Bone marrow biopsy at [facility] showed residual disease at 0.004% plasma cells   07/03/2019 Bone Marrow Biopsy   A. Bone marrow, flow cytometric analysis for multiple myeloma minimal residual disease detection:   Negative. No phenotypically abnormal plasma cells at or above the limit of detection identified.     No monotypic B-cell population identified. Negative for increased blasts.   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   10/31/2015 Miscellaneous   She has engrafted     REVIEW OF SYSTEMS:   Constitutional: Denies fevers, chills or abnormal weight loss Eyes: Denies blurriness of vision Ears, nose, mouth,  throat, and face: Denies mucositis or sore throat Respiratory: Denies cough, dyspnea or wheezes Cardiovascular: Denies palpitation, chest discomfort or lower extremity swelling Gastrointestinal:  Denies nausea, heartburn or change in bowel habits Skin: Denies abnormal skin rashes Lymphatics: Denies new lymphadenopathy or easy bruising Neurological:Denies numbness, tingling or new weaknesses Behavioral/Psych: Mood is stable, no new changes  All other systems were reviewed with the patient and are negative.  I have reviewed the past medical history, past surgical history, social history and family  history with the patient and they are unchanged from previous note.  ALLERGIES:  has No Known Allergies.  MEDICATIONS:  Current Outpatient Medications  Medication Sig Dispense Refill  . acyclovir (ZOVIRAX) 400 MG tablet TAKE 1 TABLET BY MOUTH TWICE A DAY 180 tablet 11  . calcipotriene (DOVONOX) 0.005 % cream daily.     . calcium carbonate (OS-CAL - DOSED IN MG OF ELEMENTAL CALCIUM) 1250 (500 Ca) MG tablet     . calcium carbonate (TUMS - DOSED IN MG ELEMENTAL CALCIUM) 500 MG chewable tablet Chew 1 tablet by mouth daily.    . Cholecalciferol (VITAMIN D-1000 MAX ST) 1000 units tablet Take 1,000 Units by mouth daily.    . cyanocobalamin 1000 MCG tablet Take 1,000 mcg by mouth daily.    Marland Kitchen DARATUMUMAB IV Inject into the vein every 30 (thirty) days.    . diphenoxylate-atropine (LOMOTIL) 2.5-0.025 MG tablet Take 1 tablet by mouth 4 (four) times daily as needed for diarrhea or loose stools. 60 tablet 0  . docusate sodium (COLACE) 100 MG capsule Take 100 mg by mouth daily as needed for mild constipation.     Marland Kitchen estradiol (ESTRACE VAGINAL) 0.1 MG/GM vaginal cream Place 1 Applicatorful vaginally at bedtime. 42.5 g 12  . loperamide (IMODIUM) 2 MG capsule Take by mouth as needed for diarrhea or loose stools. As needed only    . metoprolol tartrate (LOPRESSOR) 25 MG tablet TAKE 1.5 TABLETS (37.5 MG TOTAL) BY MOUTH 2 (TWO) TIMES DAILY. 270 tablet 0  . mirtazapine (REMERON) 15 MG tablet Take 1 tablet (15 mg total) by mouth at bedtime. 90 tablet 1  . Multiple Vitamin (MULTIVITAMIN WITH MINERALS) TABS tablet Take 1 tablet by mouth daily.    . ondansetron (ZOFRAN) 8 MG tablet Take 1 tablet (8 mg total) by mouth every 8 (eight) hours as needed (Nausea or vomiting). 30 tablet 1  . pantoprazole (PROTONIX) 40 MG tablet TAKE 1 TABLET (40 MG TOTAL) BY MOUTH 2 (TWO) TIMES DAILY. TAKE 30-60 MINUTES BEFORE BREAKFAST AND DINNER. 180 tablet 2   No current facility-administered medications for this visit.    PHYSICAL  EXAMINATION: ECOG PERFORMANCE STATUS: 0 - Asymptomatic  Vitals:   08/16/20 0859  BP: 123/85  Pulse: 77  Resp: 18  Temp: 97.6 F (36.4 C)  SpO2: 100%   Filed Weights   08/16/20 0859  Weight: 157 lb 6.4 oz (71.4 kg)    GENERAL:alert, no distress and comfortable NEURO: alert & oriented x 3 with fluent speech, no focal motor/sensory deficits  LABORATORY DATA:  I have reviewed the data as listed    Component Value Date/Time   NA 138 08/16/2020 0820   NA 142 01/04/2017 0902   K 3.7 08/16/2020 0820   K 3.9 01/04/2017 0902   CL 104 08/16/2020 0820   CO2 26 08/16/2020 0820   CO2 28 01/04/2017 0902   GLUCOSE 92 08/16/2020 0820   GLUCOSE 92 01/04/2017 0902   BUN 25 (H) 08/16/2020 0820   BUN  9.5 01/04/2017 0902   CREATININE 0.83 08/16/2020 0820   CREATININE 0.80 12/18/2018 0901   CREATININE 0.7 01/04/2017 0902   CALCIUM 9.1 08/16/2020 0820   CALCIUM 9.0 01/04/2017 0902   PROT 8.3 (H) 08/16/2020 0820   PROT 6.0 (L) 01/04/2017 0902   PROT 5.8 (L) 01/04/2017 0902   ALBUMIN 3.7 08/16/2020 0820   ALBUMIN 3.4 (L) 01/04/2017 0902   AST 27 08/16/2020 0820   AST 18 12/18/2018 0901   AST 21 01/04/2017 0902   ALT 27 08/16/2020 0820   ALT 14 12/18/2018 0901   ALT 16 01/04/2017 0902   ALKPHOS 69 08/16/2020 0820   ALKPHOS 101 01/04/2017 0902   BILITOT 1.1 08/16/2020 0820   BILITOT 0.3 12/18/2018 0901   BILITOT 0.56 01/04/2017 0902   GFRNONAA >60 08/16/2020 0820   GFRNONAA >60 12/18/2018 0901   GFRAA >60 03/25/2020 0917   GFRAA >60 12/18/2018 0901    No results found for: SPEP, UPEP  Lab Results  Component Value Date   WBC 9.0 08/16/2020   NEUTROABS 5.4 08/16/2020   HGB 10.3 (L) 08/16/2020   HCT 29.5 (L) 08/16/2020   MCV 104.2 (H) 08/16/2020   PLT 134 (L) 08/16/2020      Chemistry      Component Value Date/Time   NA 138 08/16/2020 0820   NA 142 01/04/2017 0902   K 3.7 08/16/2020 0820   K 3.9 01/04/2017 0902   CL 104 08/16/2020 0820   CO2 26 08/16/2020 0820    CO2 28 01/04/2017 0902   BUN 25 (H) 08/16/2020 0820   BUN 9.5 01/04/2017 0902   CREATININE 0.83 08/16/2020 0820   CREATININE 0.80 12/18/2018 0901   CREATININE 0.7 01/04/2017 0902      Component Value Date/Time   CALCIUM 9.1 08/16/2020 0820   CALCIUM 9.0 01/04/2017 0902   ALKPHOS 69 08/16/2020 0820   ALKPHOS 101 01/04/2017 0902   AST 27 08/16/2020 0820   AST 18 12/18/2018 0901   AST 21 01/04/2017 0902   ALT 27 08/16/2020 0820   ALT 14 12/18/2018 0901   ALT 16 01/04/2017 0902   BILITOT 1.1 08/16/2020 0820   BILITOT 0.3 12/18/2018 0901   BILITOT 0.56 01/04/2017 0902

## 2020-08-16 NOTE — Assessment & Plan Note (Signed)
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 She had no response to prednisone therapy She received IVIG treatment for 4 days at [facility] from 2/14 to 2/18 with positive response to treatment She does not know the plan from [place] I recommend she call to arrange follow-up I will bring her back weekly for the next 2 weeks to recheck her CBC We discussed approach to ITP in general For patients was steroid refractory, I typically do not prescribe IVIG long-term Promacta or Nplate can be considered but both can cause risk of bone marrow fibrosis I am not in favor of prescribing rituximab due to risk of severe lymphopenia long-term She will contact her physician at [facility] for further recommendation

## 2020-08-16 NOTE — Assessment & Plan Note (Signed)
Her recent bone marrow aspirate and biopsy did not show evidence of active myeloma Her treatment will be placed on hold indefinitely pending further recommendation from [place] regarding her ITP situation

## 2020-08-17 ENCOUNTER — Encounter: Payer: Self-pay | Admitting: Hematology and Oncology

## 2020-08-19 ENCOUNTER — Encounter: Payer: Self-pay | Admitting: Hematology and Oncology

## 2020-08-23 ENCOUNTER — Other Ambulatory Visit: Payer: Self-pay | Admitting: Hematology and Oncology

## 2020-08-23 ENCOUNTER — Inpatient Hospital Stay: Payer: Medicare HMO

## 2020-08-23 ENCOUNTER — Other Ambulatory Visit: Payer: Self-pay

## 2020-08-23 DIAGNOSIS — D696 Thrombocytopenia, unspecified: Secondary | ICD-10-CM

## 2020-08-23 DIAGNOSIS — C9002 Multiple myeloma in relapse: Secondary | ICD-10-CM | POA: Diagnosis not present

## 2020-08-23 DIAGNOSIS — C9000 Multiple myeloma not having achieved remission: Secondary | ICD-10-CM

## 2020-08-23 LAB — CBC WITH DIFFERENTIAL/PLATELET
Abs Immature Granulocytes: 0.01 10*3/uL (ref 0.00–0.07)
Basophils Absolute: 0 10*3/uL (ref 0.0–0.1)
Basophils Relative: 0 %
Eosinophils Absolute: 0 10*3/uL (ref 0.0–0.5)
Eosinophils Relative: 0 %
HCT: 32.7 % — ABNORMAL LOW (ref 36.0–46.0)
Hemoglobin: 10.7 g/dL — ABNORMAL LOW (ref 12.0–15.0)
Immature Granulocytes: 0 %
Lymphocytes Relative: 32 %
Lymphs Abs: 1.4 10*3/uL (ref 0.7–4.0)
MCH: 36.9 pg — ABNORMAL HIGH (ref 26.0–34.0)
MCHC: 32.7 g/dL (ref 30.0–36.0)
MCV: 112.8 fL — ABNORMAL HIGH (ref 80.0–100.0)
Monocytes Absolute: 0.5 10*3/uL (ref 0.1–1.0)
Monocytes Relative: 11 %
Neutro Abs: 2.6 10*3/uL (ref 1.7–7.7)
Neutrophils Relative %: 57 %
Platelets: 125 10*3/uL — ABNORMAL LOW (ref 150–400)
RBC: 2.9 MIL/uL — ABNORMAL LOW (ref 3.87–5.11)
RDW: 21.7 % — ABNORMAL HIGH (ref 11.5–15.5)
WBC: 4.5 10*3/uL (ref 4.0–10.5)
nRBC: 0 % (ref 0.0–0.2)

## 2020-08-23 LAB — SAMPLE TO BLOOD BANK

## 2020-08-23 NOTE — Progress Notes (Signed)
Given a copy of CBC results and 9 am infusion appt canceled for platelets, per Dr. Alvy Bimler. She verbalized understanding.

## 2020-08-30 ENCOUNTER — Encounter: Payer: Self-pay | Admitting: Hematology and Oncology

## 2020-08-30 ENCOUNTER — Inpatient Hospital Stay: Payer: Medicare HMO

## 2020-08-30 ENCOUNTER — Other Ambulatory Visit: Payer: Self-pay | Admitting: Hematology and Oncology

## 2020-08-30 ENCOUNTER — Inpatient Hospital Stay: Payer: Medicare HMO | Attending: Hematology and Oncology

## 2020-08-30 ENCOUNTER — Inpatient Hospital Stay: Payer: Medicare HMO | Admitting: Hematology and Oncology

## 2020-08-30 ENCOUNTER — Other Ambulatory Visit: Payer: Self-pay

## 2020-08-30 ENCOUNTER — Telehealth: Payer: Self-pay | Admitting: Hematology and Oncology

## 2020-08-30 DIAGNOSIS — D693 Immune thrombocytopenic purpura: Secondary | ICD-10-CM | POA: Insufficient documentation

## 2020-08-30 DIAGNOSIS — M8580 Other specified disorders of bone density and structure, unspecified site: Secondary | ICD-10-CM | POA: Insufficient documentation

## 2020-08-30 DIAGNOSIS — C9000 Multiple myeloma not having achieved remission: Secondary | ICD-10-CM

## 2020-08-30 DIAGNOSIS — C9002 Multiple myeloma in relapse: Secondary | ICD-10-CM | POA: Diagnosis present

## 2020-08-30 DIAGNOSIS — Z5112 Encounter for antineoplastic immunotherapy: Secondary | ICD-10-CM | POA: Diagnosis present

## 2020-08-30 DIAGNOSIS — Z9221 Personal history of antineoplastic chemotherapy: Secondary | ICD-10-CM | POA: Insufficient documentation

## 2020-08-30 DIAGNOSIS — D701 Agranulocytosis secondary to cancer chemotherapy: Secondary | ICD-10-CM | POA: Diagnosis not present

## 2020-08-30 DIAGNOSIS — Z9481 Bone marrow transplant status: Secondary | ICD-10-CM | POA: Diagnosis not present

## 2020-08-30 DIAGNOSIS — Z79899 Other long term (current) drug therapy: Secondary | ICD-10-CM | POA: Diagnosis not present

## 2020-08-30 DIAGNOSIS — T451X5A Adverse effect of antineoplastic and immunosuppressive drugs, initial encounter: Secondary | ICD-10-CM | POA: Diagnosis not present

## 2020-08-30 DIAGNOSIS — D696 Thrombocytopenia, unspecified: Secondary | ICD-10-CM

## 2020-08-30 DIAGNOSIS — Z7982 Long term (current) use of aspirin: Secondary | ICD-10-CM | POA: Diagnosis not present

## 2020-08-30 LAB — CBC WITH DIFFERENTIAL/PLATELET
Abs Immature Granulocytes: 0.01 10*3/uL (ref 0.00–0.07)
Basophils Absolute: 0 10*3/uL (ref 0.0–0.1)
Basophils Relative: 0 %
Eosinophils Absolute: 0.1 10*3/uL (ref 0.0–0.5)
Eosinophils Relative: 2 %
HCT: 31.6 % — ABNORMAL LOW (ref 36.0–46.0)
Hemoglobin: 10.7 g/dL — ABNORMAL LOW (ref 12.0–15.0)
Immature Granulocytes: 0 %
Lymphocytes Relative: 37 %
Lymphs Abs: 2 10*3/uL (ref 0.7–4.0)
MCH: 37.5 pg — ABNORMAL HIGH (ref 26.0–34.0)
MCHC: 33.9 g/dL (ref 30.0–36.0)
MCV: 110.9 fL — ABNORMAL HIGH (ref 80.0–100.0)
Monocytes Absolute: 0.6 10*3/uL (ref 0.1–1.0)
Monocytes Relative: 10 %
Neutro Abs: 2.7 10*3/uL (ref 1.7–7.7)
Neutrophils Relative %: 51 %
Platelets: 104 10*3/uL — ABNORMAL LOW (ref 150–400)
RBC: 2.85 MIL/uL — ABNORMAL LOW (ref 3.87–5.11)
RDW: 17.8 % — ABNORMAL HIGH (ref 11.5–15.5)
WBC: 5.4 10*3/uL (ref 4.0–10.5)
nRBC: 0 % (ref 0.0–0.2)

## 2020-08-30 LAB — SAMPLE TO BLOOD BANK

## 2020-08-30 MED ORDER — MIRTAZAPINE 15 MG PO TABS: 15.0000 mg | ORAL_TABLET | Freq: Every day | ORAL | 1 refills | Status: DC

## 2020-08-30 MED ORDER — POMALIDOMIDE 2 MG PO CAPS: 2.0000 mg | ORAL_CAPSULE | Freq: Every day | ORAL | 0 refills | Status: DC

## 2020-08-30 NOTE — Assessment & Plan Note (Signed)
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

## 2020-08-30 NOTE — Telephone Encounter (Signed)
Scheduled appts per 3/7 sch msg. Gave pt a print out of AVS.

## 2020-08-30 NOTE — Assessment & Plan Note (Signed)
She has residual disease based on her last bone marrow biopsy For now, I recommend she resume Pomalyst only without daratumumab until her ITP is well controlled

## 2020-08-30 NOTE — Progress Notes (Signed)
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[facility]) Her platelet count continues to drop 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 We discussed the risk, benefits, side effects of IVIG treatment and she is in agreement to proceed with IVIG next week if her platelet count drops further She will return in 1 week to check her platelet count with possibility of platelet transfusion support Tentatively, I will schedule IVIG on 3/18 We also 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   All questions were answered. The patient knows to call the clinic with any problems, questions or concerns. The total time spent in the appointment was 20 minutes encounter with patients including review of chart and various tests results, discussions about plan of care and coordination of care plan   [name], MD 08/30/2020 9:39 AM  INTERVAL HISTORY: Please see below for problem oriented charting. She returns for further follow-up She feels well Denies recent infection, fever or chills No recent bleeding  SUMMARY OF ONCOLOGIC HISTORY: Oncology History Overview Note  Multiple  myeloma, Ig A Lambda, M spike 3.54 grams, Calcium 9.2, Creatinine 0.8, Beta 2 microglobulin 4.52, IgA 4840 mg/dL, lambda light chain 75.4, albumin 3.6, hemoglobin 9.7, platelet 115    Primary site: Multiple Myeloma   Staging method: AJCC 6th Edition   Clinical: Stage IIA signed by [name], MD on 11/07/2013  2:46 PM   Summary: Stage IIA      Multiple myeloma ([place])  10/31/2013 Bone Marrow Biopsy   Bone marrow biopsy confirmed multiple myeloma with 40% bone marrow involvement. Skeletal survey showed minimal lesions in her score with generalized demineralization   11/10/2013 - 02/13/2014 Chemotherapy   The patient is started on induction chemotherapy with weekly dexamethasone 40 mg by mouth as well as Velcade subcutaneous injection on days 1, 4, 8 and 11. On 11/21/2013, she was started on monthly Zometa.   12/23/2013 Adverse Reaction   The dose of Velcade was reduced due to thrombocytopenia.   01/28/2014 - 04/07/2014 Chemotherapy   Revlimid is added. Treatment was discontinued due to lack of response.   02/24/2014 - 04/07/2014 Chemotherapy   Due to worsening peripheral neuropathy, Velcade injection is changed to once a week. Revlimid was given 21 days on, 7 days off.   04/07/2014 - 04/10/2014 Chemotherapy   Revlimid was discontinued due to lack of response. Chemotherapy was changed back to Velcade injection twice a week, 2 weeks on 1 week off. Her treatment was switched to to minimum response   04/20/2014 - 06/02/2014 Chemotherapy   chemotherapy is switched to Carfilzomib, Cytoxan and dexamethasone.   04/22/2014 Procedure   she has placement of port for chemotherapy.   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 at [facility]   10/20/2014 - 03/24/2015 Chemotherapy    she received chemotherapy with Kyprolis, Revlimid and dexamethasone   10/22/2014 Procedure   She has port placement   01/19/2015 Tumor Marker   IgA lambda M spike at 0.4 g    01/20/2015 Miscellaneous   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 No cource was found and fever resolved with IV vancomycin and meropenem   09/13/2015 Bone Marrow Biopsy   Bone marrow biopsy showed no increased blasts, 3-4 % plasma cells   03/02/2016 Bone Marrow Biopsy   Bone marrow biopsy at [facility] showed normocellular (30%) bone marrow with trilineage hematopoiesis. No significant increase in blasts. No significant increase in plasma cells.   05/12/2016 Imaging   DEXA scan at [place] showed osteopenia   10/24/2016 Imaging   Skeletal survey at [facility], no new lesions   12/07/2017 Imaging   No focal abnormality noted to suggest myeloma. Exam is stable from prior exam.   03/01/2018 Procedure   Successful 8 French right internal jugular vein power port placement with its tip at the SVC/RA junction.   03/06/2018 -  Chemotherapy   The patient had daratumumab    07/09/2018 Bone Marrow Biopsy   Bone marrow biopsy at [facility] showed residual disease at 0.004% plasma cells   07/03/2019 Bone Marrow Biopsy   A. Bone marrow, flow cytometric analysis for multiple myeloma minimal residual disease detection:   Negative. No  phenotypically abnormal plasma cells at or above the limit of detection identified.     No monotypic B-cell population identified. Negative for increased blasts.   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   10/31/2015 Miscellaneous   She has engrafted     REVIEW OF SYSTEMS:   Constitutional: Denies fevers, chills or abnormal weight loss Eyes: Denies blurriness of vision Ears, nose, mouth, throat, and face: Denies mucositis or sore throat Respiratory: Denies cough, dyspnea or wheezes Cardiovascular: Denies palpitation, chest discomfort or lower extremity swelling Gastrointestinal:  Denies nausea, heartburn or change in bowel habits Skin: Denies abnormal skin rashes Lymphatics: Denies new lymphadenopathy or easy bruising Neurological:Denies numbness, tingling or new weaknesses Behavioral/Psych: Mood is stable, no new  changes  All other systems were reviewed with the patient and are negative.  I have reviewed the past medical history, past surgical history, social history and family history with the patient and they are unchanged from previous note.  ALLERGIES:  has No Known Allergies.  MEDICATIONS:  Current Outpatient Medications  Medication Sig Dispense Refill  . acyclovir (ZOVIRAX) 400 MG tablet TAKE 1 TABLET BY MOUTH TWICE A DAY 180 tablet 11  . calcipotriene (DOVONOX) 0.005 % cream daily.      . calcium carbonate (OS-CAL - DOSED IN MG OF ELEMENTAL CALCIUM) 1250 (500 Ca) MG tablet     . calcium carbonate (TUMS - DOSED IN MG ELEMENTAL CALCIUM) 500 MG chewable tablet Chew 1 tablet by mouth daily.    . Cholecalciferol (VITAMIN D-1000 MAX ST) 1000 units tablet Take 1,000 Units by mouth daily.    . cyanocobalamin 1000 MCG tablet Take 1,000 mcg by mouth daily.    Marland Kitchen DARATUMUMAB IV Inject into the vein every 30 (thirty) days.    . diphenoxylate-atropine (LOMOTIL) 2.5-0.025 MG tablet Take 1 tablet by mouth 4 (four) times daily as needed for diarrhea or loose stools. 60 tablet 0  . docusate sodium (COLACE) 100 MG capsule Take 100 mg by mouth daily as needed for mild constipation.     Marland Kitchen estradiol (ESTRACE VAGINAL) 0.1 MG/GM vaginal cream Place 1 Applicatorful vaginally at bedtime. 42.5 g 12  . loperamide (IMODIUM) 2 MG capsule Take by mouth as needed for diarrhea or loose stools. As needed only    . metoprolol tartrate (LOPRESSOR) 25 MG tablet TAKE 1.5 TABLETS (37.5 MG TOTAL) BY MOUTH 2 (TWO) TIMES DAILY. 270 tablet 0  . mirtazapine (REMERON) 15 MG tablet Take 1 tablet (15 mg total) by mouth at bedtime. 90 tablet 1  . Multiple Vitamin (MULTIVITAMIN WITH MINERALS) TABS tablet Take 1 tablet by mouth daily.    . ondansetron (ZOFRAN) 8 MG tablet Take 1 tablet (8 mg total) by mouth every 8 (eight) hours as needed (Nausea or vomiting). 30 tablet 1  . pantoprazole (PROTONIX) 40 MG tablet TAKE 1 TABLET (40 MG TOTAL) BY MOUTH 2 (TWO) TIMES DAILY. TAKE 30-60 MINUTES BEFORE BREAKFAST AND DINNER. 180 tablet 2   No current facility-administered medications for this visit.    PHYSICAL EXAMINATION: ECOG PERFORMANCE STATUS: 1 - Symptomatic but completely ambulatory  Vitals:   08/30/20 0828  BP: 122/66  Pulse: 70  Resp: 18  Temp: 97.8 F (36.6 C)  SpO2: 98%   Filed Weights   08/30/20 0828  Weight: 159 lb 12.8 oz (72.5 kg)    GENERAL:alert, no distress and comfortable NEURO: alert & oriented x 3  with fluent speech, no focal motor/sensory deficits  LABORATORY DATA:  I have reviewed the data as listed    Component Value Date/Time   NA 138 08/16/2020 0820   NA 142 01/04/2017 0902   K 3.7 08/16/2020 0820   K 3.9 01/04/2017 0902   CL 104 08/16/2020 0820   CO2 26 08/16/2020 0820   CO2 28 01/04/2017 0902   GLUCOSE 92 08/16/2020 0820   GLUCOSE 92 01/04/2017 0902   BUN 25 (H) 08/16/2020 0820   BUN 9.5 01/04/2017 0902   CREATININE 0.83 08/16/2020 0820   CREATININE 0.80 12/18/2018 0901   CREATININE 0.7 01/04/2017 0902   CALCIUM 9.1 08/16/2020 0820   CALCIUM 9.0 01/04/2017 0902   PROT 8.3 (H) 08/16/2020 0820   PROT 6.0 (L) 01/04/2017 0902   PROT 5.8 (L) 01/04/2017 8251  ALBUMIN 3.7 08/16/2020 0820   ALBUMIN 3.4 (L) 01/04/2017 0902   AST 27 08/16/2020 0820   AST 18 12/18/2018 0901   AST 21 01/04/2017 0902   ALT 27 08/16/2020 0820   ALT 14 12/18/2018 0901   ALT 16 01/04/2017 0902   ALKPHOS 69 08/16/2020 0820   ALKPHOS 101 01/04/2017 0902   BILITOT 1.1 08/16/2020 0820   BILITOT 0.3 12/18/2018 0901   BILITOT 0.56 01/04/2017 0902   GFRNONAA >60 08/16/2020 0820   GFRNONAA >60 12/18/2018 0901   GFRAA >60 03/25/2020 0917   GFRAA >60 12/18/2018 0901    No results found for: SPEP, UPEP  Lab Results  Component Value Date   WBC 5.4 08/30/2020   NEUTROABS 2.7 08/30/2020   HGB 10.7 (L) 08/30/2020   HCT 31.6 (L) 08/30/2020   MCV 110.9 (H) 08/30/2020   PLT 104 (L) 08/30/2020      Chemistry      Component Value Date/Time   NA 138 08/16/2020 0820   NA 142 01/04/2017 0902   K 3.7 08/16/2020 0820   K 3.9 01/04/2017 0902   CL 104 08/16/2020 0820   CO2 26 08/16/2020 0820   CO2 28 01/04/2017 0902   BUN 25 (H) 08/16/2020 0820   BUN 9.5 01/04/2017 0902   CREATININE 0.83 08/16/2020 0820   CREATININE 0.80 12/18/2018 0901   CREATININE 0.7 01/04/2017 0902      Component Value Date/Time   CALCIUM 9.1 08/16/2020 0820   CALCIUM 9.0 01/04/2017 0902   ALKPHOS 69 08/16/2020 0820    ALKPHOS 101 01/04/2017 0902   AST 27 08/16/2020 0820   AST 18 12/18/2018 0901   AST 21 01/04/2017 0902   ALT 27 08/16/2020 0820   ALT 14 12/18/2018 0901   ALT 16 01/04/2017 0902   BILITOT 1.1 08/16/2020 0820   BILITOT 0.3 12/18/2018 0901   BILITOT 0.56 01/04/2017 0902

## 2020-08-31 LAB — KAPPA/LAMBDA LIGHT CHAINS
Kappa free light chain: 9.9 mg/L (ref 3.3–19.4)
Kappa, lambda light chain ratio: 0.32 (ref 0.26–1.65)
Lambda free light chains: 31.2 mg/L — ABNORMAL HIGH (ref 5.7–26.3)

## 2020-09-02 LAB — MULTIPLE MYELOMA PANEL, SERUM
Albumin SerPl Elph-Mcnc: 3.3 g/dL (ref 2.9–4.4)
Albumin/Glob SerPl: 1.1 (ref 0.7–1.7)
Alpha 1: 0.3 g/dL (ref 0.0–0.4)
Alpha2 Glob SerPl Elph-Mcnc: 0.7 g/dL (ref 0.4–1.0)
B-Globulin SerPl Elph-Mcnc: 0.9 g/dL (ref 0.7–1.3)
Gamma Glob SerPl Elph-Mcnc: 1.3 g/dL (ref 0.4–1.8)
Globulin, Total: 3.1 g/dL (ref 2.2–3.9)
IgA: 170 mg/dL (ref 64–422)
IgG (Immunoglobin G), Serum: 1287 mg/dL (ref 586–1602)
IgM (Immunoglobulin M), Srm: 44 mg/dL (ref 26–217)
Total Protein ELP: 6.4 g/dL (ref 6.0–8.5)

## 2020-09-03 ENCOUNTER — Telehealth: Payer: Self-pay

## 2020-09-03 NOTE — Telephone Encounter (Signed)
-----   Message from [name], MD sent at 09/03/2020  7:54 AM EST ----- Let her know myeloma panel is normal

## 2020-09-03 NOTE — Telephone Encounter (Signed)
Called and given below message. She verbalzied understanding. 

## 2020-09-06 ENCOUNTER — Inpatient Hospital Stay: Payer: Medicare HMO

## 2020-09-06 ENCOUNTER — Other Ambulatory Visit: Payer: Self-pay

## 2020-09-06 ENCOUNTER — Other Ambulatory Visit: Payer: Self-pay | Admitting: Hematology and Oncology

## 2020-09-06 DIAGNOSIS — D696 Thrombocytopenia, unspecified: Secondary | ICD-10-CM

## 2020-09-06 DIAGNOSIS — Z5112 Encounter for antineoplastic immunotherapy: Secondary | ICD-10-CM | POA: Diagnosis not present

## 2020-09-06 DIAGNOSIS — C9000 Multiple myeloma not having achieved remission: Secondary | ICD-10-CM

## 2020-09-06 LAB — CBC WITH DIFFERENTIAL/PLATELET
Abs Immature Granulocytes: 0.03 10*3/uL (ref 0.00–0.07)
Basophils Absolute: 0 10*3/uL (ref 0.0–0.1)
Basophils Relative: 0 %
Eosinophils Absolute: 0 10*3/uL (ref 0.0–0.5)
Eosinophils Relative: 0 %
HCT: 34.9 % — ABNORMAL LOW (ref 36.0–46.0)
Hemoglobin: 11.4 g/dL — ABNORMAL LOW (ref 12.0–15.0)
Immature Granulocytes: 0 %
Lymphocytes Relative: 24 %
Lymphs Abs: 1.7 10*3/uL (ref 0.7–4.0)
MCH: 36.8 pg — ABNORMAL HIGH (ref 26.0–34.0)
MCHC: 32.7 g/dL (ref 30.0–36.0)
MCV: 112.6 fL — ABNORMAL HIGH (ref 80.0–100.0)
Monocytes Absolute: 0.6 10*3/uL (ref 0.1–1.0)
Monocytes Relative: 8 %
Neutro Abs: 4.8 10*3/uL (ref 1.7–7.7)
Neutrophils Relative %: 68 %
Platelets: 114 10*3/uL — ABNORMAL LOW (ref 150–400)
RBC: 3.1 MIL/uL — ABNORMAL LOW (ref 3.87–5.11)
RDW: 15.2 % (ref 11.5–15.5)
WBC: 7.2 10*3/uL (ref 4.0–10.5)
nRBC: 0 % (ref 0.0–0.2)

## 2020-09-06 LAB — SAMPLE TO BLOOD BANK

## 2020-09-07 ENCOUNTER — Other Ambulatory Visit: Payer: Self-pay | Admitting: Cardiovascular Disease

## 2020-09-10 ENCOUNTER — Inpatient Hospital Stay: Payer: Medicare HMO

## 2020-09-10 ENCOUNTER — Telehealth: Payer: Self-pay | Admitting: Hematology and Oncology

## 2020-09-10 ENCOUNTER — Encounter: Payer: Self-pay | Admitting: Hematology and Oncology

## 2020-09-10 ENCOUNTER — Other Ambulatory Visit: Payer: Self-pay

## 2020-09-10 VITALS — BP 123/68 | HR 59 | Temp 98.8°F | Resp 18

## 2020-09-10 DIAGNOSIS — D469 Myelodysplastic syndrome, unspecified: Secondary | ICD-10-CM

## 2020-09-10 DIAGNOSIS — C9002 Multiple myeloma in relapse: Secondary | ICD-10-CM

## 2020-09-10 DIAGNOSIS — C9000 Multiple myeloma not having achieved remission: Secondary | ICD-10-CM

## 2020-09-10 DIAGNOSIS — D696 Thrombocytopenia, unspecified: Secondary | ICD-10-CM

## 2020-09-10 DIAGNOSIS — Z5112 Encounter for antineoplastic immunotherapy: Secondary | ICD-10-CM | POA: Diagnosis not present

## 2020-09-10 LAB — CBC WITH DIFFERENTIAL/PLATELET
Abs Immature Granulocytes: 0.04 10*3/uL (ref 0.00–0.07)
Basophils Absolute: 0 10*3/uL (ref 0.0–0.1)
Basophils Relative: 0 %
Eosinophils Absolute: 0.2 10*3/uL (ref 0.0–0.5)
Eosinophils Relative: 3 %
HCT: 32.7 % — ABNORMAL LOW (ref 36.0–46.0)
Hemoglobin: 10.8 g/dL — ABNORMAL LOW (ref 12.0–15.0)
Immature Granulocytes: 1 %
Lymphocytes Relative: 24 %
Lymphs Abs: 1.8 10*3/uL (ref 0.7–4.0)
MCH: 37.2 pg — ABNORMAL HIGH (ref 26.0–34.0)
MCHC: 33 g/dL (ref 30.0–36.0)
MCV: 112.8 fL — ABNORMAL HIGH (ref 80.0–100.0)
Monocytes Absolute: 0.8 10*3/uL (ref 0.1–1.0)
Monocytes Relative: 11 %
Neutro Abs: 4.5 10*3/uL (ref 1.7–7.7)
Neutrophils Relative %: 61 %
Platelets: 114 10*3/uL — ABNORMAL LOW (ref 150–400)
RBC: 2.9 MIL/uL — ABNORMAL LOW (ref 3.87–5.11)
RDW: 14.7 % (ref 11.5–15.5)
WBC: 7.4 10*3/uL (ref 4.0–10.5)
nRBC: 0 % (ref 0.0–0.2)

## 2020-09-10 LAB — SAMPLE TO BLOOD BANK

## 2020-09-10 MED ORDER — FAMOTIDINE IN NACL 20-0.9 MG/50ML-% IV SOLN
INTRAVENOUS | Status: AC
  Filled 2020-09-10: qty 50

## 2020-09-10 MED ORDER — SODIUM CHLORIDE 0.9% FLUSH
10.0000 mL | INTRAVENOUS | Status: DC | PRN
  Administered 2020-09-10: 10 mL
  Filled 2020-09-10: qty 10

## 2020-09-10 MED ORDER — HEPARIN SOD (PORK) LOCK FLUSH 100 UNIT/ML IV SOLN
500.0000 [IU] | Freq: Once | INTRAVENOUS | Status: AC | PRN
  Administered 2020-09-10: 500 [IU]
  Filled 2020-09-10: qty 5

## 2020-09-10 MED ORDER — ACETAMINOPHEN 325 MG PO TABS
650.0000 mg | ORAL_TABLET | Freq: Once | ORAL | Status: AC
  Administered 2020-09-10: 650 mg via ORAL

## 2020-09-10 MED ORDER — ACETAMINOPHEN 325 MG PO TABS
ORAL_TABLET | ORAL | Status: AC
  Filled 2020-09-10: qty 2

## 2020-09-10 MED ORDER — FAMOTIDINE IN NACL 20-0.9 MG/50ML-% IV SOLN
20.0000 mg | Freq: Once | INTRAVENOUS | Status: AC
  Administered 2020-09-10: 20 mg via INTRAVENOUS

## 2020-09-10 MED ORDER — SODIUM CHLORIDE 0.9% FLUSH
10.0000 mL | Freq: Once | INTRAVENOUS | Status: AC | PRN
  Administered 2020-09-10: 10 mL
  Filled 2020-09-10: qty 10

## 2020-09-10 MED ORDER — SODIUM CHLORIDE 0.9 % IV SOLN
Freq: Once | INTRAVENOUS | Status: AC
  Filled 2020-09-10: qty 250

## 2020-09-10 MED ORDER — SODIUM CHLORIDE 0.9 % IV SOLN
16.0000 mg/kg | Freq: Once | INTRAVENOUS | Status: AC
  Administered 2020-09-10: 1200 mg via INTRAVENOUS
  Filled 2020-09-10: qty 60

## 2020-09-10 NOTE — Patient Instructions (Signed)
[facility] Discharge Instructions for Patients Receiving Chemotherapy  Today you received the following chemotherapy agents: darzalex  To help prevent nausea and vomiting after your treatment, we encourage you to take your nausea medication as directed    If you develop nausea and vomiting that is not controlled by your nausea medication, call the clinic.   BELOW ARE SYMPTOMS THAT SHOULD BE REPORTED IMMEDIATELY:  *FEVER GREATER THAN 100.5 F  *CHILLS WITH OR WITHOUT FEVER  NAUSEA AND VOMITING THAT IS NOT CONTROLLED WITH YOUR NAUSEA MEDICATION  *UNUSUAL SHORTNESS OF BREATH  *UNUSUAL BRUISING OR BLEEDING  TENDERNESS IN MOUTH AND THROAT WITH OR WITHOUT PRESENCE OF ULCERS  *URINARY PROBLEMS  *BOWEL PROBLEMS  UNUSUAL RASH Items with * indicate a potential emergency and should be followed up as soon as possible.  Feel free to call the clinic should you have any questions or concerns. The clinic phone number is [phone].  Please show the CHEMO ALERT CARD at check-in to the Emergency Department and triage nurse.   

## 2020-09-10 NOTE — Telephone Encounter (Signed)
Scheduled appointments per 3/18 sch msg. Pt aware.  

## 2020-09-10 NOTE — Patient Instructions (Signed)

## 2020-09-13 ENCOUNTER — Inpatient Hospital Stay: Payer: Medicare HMO

## 2020-09-13 ENCOUNTER — Inpatient Hospital Stay: Payer: Medicare HMO | Admitting: Hematology and Oncology

## 2020-09-16 ENCOUNTER — Other Ambulatory Visit: Payer: Self-pay

## 2020-09-16 MED ORDER — POMALIDOMIDE 2 MG PO CAPS: 2.0000 mg | ORAL_CAPSULE | Freq: Every day | ORAL | 0 refills | Status: DC

## 2020-09-17 ENCOUNTER — Encounter: Payer: Self-pay | Admitting: Hematology and Oncology

## 2020-09-21 ENCOUNTER — Inpatient Hospital Stay: Payer: Medicare HMO

## 2020-09-21 ENCOUNTER — Encounter: Payer: Self-pay | Admitting: Hematology and Oncology

## 2020-09-21 ENCOUNTER — Inpatient Hospital Stay: Payer: Medicare HMO | Admitting: Hematology and Oncology

## 2020-09-21 ENCOUNTER — Other Ambulatory Visit: Payer: Self-pay

## 2020-09-21 VITALS — BP 141/51 | HR 67 | Temp 97.3°F | Resp 16 | Ht 62.0 in | Wt 163.8 lb

## 2020-09-21 DIAGNOSIS — D696 Thrombocytopenia, unspecified: Secondary | ICD-10-CM

## 2020-09-21 DIAGNOSIS — C9000 Multiple myeloma not having achieved remission: Secondary | ICD-10-CM

## 2020-09-21 DIAGNOSIS — Z5112 Encounter for antineoplastic immunotherapy: Secondary | ICD-10-CM | POA: Diagnosis not present

## 2020-09-21 DIAGNOSIS — T451X5A Adverse effect of antineoplastic and immunosuppressive drugs, initial encounter: Secondary | ICD-10-CM | POA: Diagnosis not present

## 2020-09-21 DIAGNOSIS — D701 Agranulocytosis secondary to cancer chemotherapy: Secondary | ICD-10-CM | POA: Diagnosis not present

## 2020-09-21 LAB — CBC WITH DIFFERENTIAL/PLATELET
Abs Immature Granulocytes: 0 10*3/uL (ref 0.00–0.07)
Basophils Absolute: 0 10*3/uL (ref 0.0–0.1)
Basophils Relative: 1 %
Eosinophils Absolute: 0 10*3/uL (ref 0.0–0.5)
Eosinophils Relative: 0 %
HCT: 39 % (ref 36.0–46.0)
Hemoglobin: 12.5 g/dL (ref 12.0–15.0)
Immature Granulocytes: 0 %
Lymphocytes Relative: 60 %
Lymphs Abs: 2.2 10*3/uL (ref 0.7–4.0)
MCH: 36 pg — ABNORMAL HIGH (ref 26.0–34.0)
MCHC: 32.1 g/dL (ref 30.0–36.0)
MCV: 112.4 fL — ABNORMAL HIGH (ref 80.0–100.0)
Monocytes Absolute: 0.5 10*3/uL (ref 0.1–1.0)
Monocytes Relative: 15 %
Neutro Abs: 0.9 10*3/uL — ABNORMAL LOW (ref 1.7–7.7)
Neutrophils Relative %: 24 %
Platelets: 185 10*3/uL (ref 150–400)
RBC: 3.47 MIL/uL — ABNORMAL LOW (ref 3.87–5.11)
RDW: 13.1 % (ref 11.5–15.5)
WBC: 3.6 10*3/uL — ABNORMAL LOW (ref 4.0–10.5)
nRBC: 0 % (ref 0.0–0.2)

## 2020-09-21 LAB — SAMPLE TO BLOOD BANK

## 2020-09-21 NOTE — Assessment & Plan Note (Signed)
This is likely due to recent treatment. The patient denies recent history of fevers, cough, chills, diarrhea or dysuria. She is asymptomatic from the leukopenia. I will observe for now.  I will continue the chemotherapy at current dose without dosage adjustment.  If the leukopenia gets progressive worse in the future, I might have to delay her treatment or adjust the chemotherapy dose.   

## 2020-09-21 NOTE — Assessment & Plan Note (Signed)
Recent myeloma panel showed no detectable M spike and with normal light chain ratio She tolerated resumption of Pomalyst and daratumumab well She will continue aspirin for DVT prophylaxis and acyclovir for antimicrobial prophylaxis We have discontinue Zometa due to long-term use She will continue calcium with vitamin D and she will be due for another bone density scan November of this year

## 2020-09-21 NOTE — Progress Notes (Signed)
[place] OFFICE PROGRESS NOTE  Patient Care Team: [name], [name], MD as PCP - General (Internal Medicine) [name], MD as PCP - Cardiology (Cardiology) [name], MD as Referring Physician (Hematology and Oncology) [name], MD as Consulting Physician (Internal Medicine) [name], [name], MD as Consulting Physician (Infectious Diseases) [name], MD as Consulting Physician (Hematology and Oncology) [name], NP as Nurse Practitioner (Hematology and Oncology)  ASSESSMENT & PLAN:  Multiple myeloma [facility]) Recent myeloma panel showed no detectable M spike and with normal light chain ratio She tolerated resumption of Pomalyst and daratumumab well She will continue aspirin for DVT prophylaxis and acyclovir for antimicrobial prophylaxis We have discontinue Zometa due to long-term use She will continue calcium with vitamin D and she will be due for another bone density scan November of this year  Leukopenia due to antineoplastic chemotherapy [facility]) This is likely due to recent treatment. The patient denies recent history of fevers, cough, chills, diarrhea or dysuria. She is asymptomatic from the leukopenia. I will observe for now.  I will continue the chemotherapy at current dose without dosage adjustment.  If the leukopenia gets progressive worse in the future, I might have to delay her treatment or adjust the chemotherapy dose.     Orders Placed This Encounter  Procedures  . CBC with Differential/Platelet    Standing Status:   Standing    Number of Occurrences:   22    Standing Expiration Date:   09/21/2021  . Comprehensive metabolic panel    Standing Status:   Standing    Number of Occurrences:   33    Standing Expiration Date:   09/21/2021    All questions were answered. The patient knows to call the clinic with any problems, questions or concerns. The total time spent in the appointment was 20 minutes  encounter with patients including review of chart and various tests results, discussions about plan of care and coordination of care plan   [name], MD 09/21/2020 8:31 AM  INTERVAL HISTORY: Please see below for problem oriented charting. She returns for treatment and follow-up She tolerated recent treatment well except for very mild occasional diarrhea The patient denies any recent signs or symptoms of bleeding such as spontaneous epistaxis, hematuria or hematochezia. No recent infection, fever or chills She has returned from a trip visiting her daughter  SUMMARY OF ONCOLOGIC HISTORY: Oncology History Overview Note  Multiple myeloma, Ig A Lambda, M spike 3.54 grams, Calcium 9.2, Creatinine 0.8, Beta 2 microglobulin 4.52, IgA 4840 mg/dL, lambda light chain 75.4, albumin 3.6, hemoglobin 9.7, platelet 115    Primary site: Multiple Myeloma   Staging method: AJCC 6th Edition   Clinical: Stage IIA signed by [name], MD on 11/07/2013  2:46 PM   Summary: Stage IIA      Multiple myeloma ([place])  10/31/2013 Bone Marrow Biopsy   Bone marrow biopsy confirmed multiple myeloma with 40% bone marrow involvement. Skeletal survey showed minimal lesions in her score with generalized demineralization   11/10/2013 - 02/13/2014 Chemotherapy   The patient is started on induction chemotherapy with weekly dexamethasone 40 mg by mouth as well as Velcade subcutaneous injection on days 1, 4, 8 and 11. On 11/21/2013, she was started on monthly Zometa.   12/23/2013 Adverse Reaction   The dose of Velcade was reduced due to thrombocytopenia.   01/28/2014 - 04/07/2014 Chemotherapy   Revlimid is added. Treatment was discontinued due to lack of  response.   02/24/2014 - 04/07/2014 Chemotherapy   Due to worsening peripheral neuropathy, Velcade injection is changed to once a week. Revlimid was given 21 days on, 7 days off.   04/07/2014 - 04/10/2014 Chemotherapy   Revlimid was discontinued due to lack of response.  Chemotherapy was changed back to Velcade injection twice a week, 2 weeks on 1 week off. Her treatment was switched to to minimum response   04/20/2014 - 06/02/2014 Chemotherapy   chemotherapy is switched to Carfilzomib, Cytoxan and dexamethasone.   04/22/2014 Procedure   she has placement of port for chemotherapy.   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 at [facility]   10/20/2014 - 03/24/2015 Chemotherapy    she received chemotherapy with Kyprolis, Revlimid and dexamethasone   10/22/2014 Procedure   She has port placement   01/19/2015 Tumor Marker   IgA lambda M spike at 0.4 g    01/20/2015 Miscellaneous   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 No cource was found and fever resolved with IV vancomycin and meropenem   09/13/2015 Bone Marrow Biopsy   Bone marrow biopsy showed no increased blasts, 3-4 % plasma cells   03/02/2016 Bone Marrow Biopsy   Bone marrow biopsy at [facility] showed normocellular (30%) bone marrow with trilineage hematopoiesis. No significant increase in blasts. No significant increase in plasma cells.   05/12/2016 Imaging   DEXA scan at [place]  showed osteopenia   10/24/2016 Imaging   Skeletal survey at [facility], no new lesions   12/07/2017 Imaging   No focal abnormality noted to suggest myeloma. Exam is stable from prior exam.   03/01/2018 Procedure   Successful 8 French right internal jugular vein power port placement with its tip at the SVC/RA junction.   03/06/2018 -  Chemotherapy   The patient had daratumumab    07/09/2018 Bone Marrow Biopsy   Bone marrow biopsy at [facility] showed residual disease at 0.004% plasma cells   07/03/2019 Bone Marrow Biopsy   A. Bone marrow, flow cytometric analysis for multiple myeloma minimal residual disease detection:   Negative. No phenotypically abnormal plasma cells at or above the limit of detection identified.     No monotypic B-cell population identified. Negative for increased blasts.   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   10/31/2015 Miscellaneous   She has engrafted     REVIEW OF SYSTEMS:   Constitutional: Denies fevers, chills or abnormal weight loss Eyes: Denies blurriness of vision Ears, nose, mouth, throat, and face: Denies mucositis or sore throat Respiratory: Denies cough, dyspnea or wheezes Cardiovascular: Denies palpitation, chest discomfort or lower extremity swelling Gastrointestinal:  Denies nausea, heartburn or change in bowel habits Skin: Denies abnormal skin rashes Lymphatics:  Denies new lymphadenopathy or easy bruising Neurological:Denies numbness, tingling or new weaknesses Behavioral/Psych: Mood is stable, no new changes  All other systems were reviewed with the patient and are negative.  I have reviewed the past medical history, past surgical history, social history and family history with the patient and they are unchanged from previous note.  ALLERGIES:  has No Known Allergies.  MEDICATIONS:  Current Outpatient Medications  Medication Sig Dispense Refill  . aspirin 325 MG tablet Take 325 mg by mouth daily.    Marland Kitchen acyclovir (ZOVIRAX) 400 MG tablet TAKE 1 TABLET BY MOUTH TWICE A DAY 180 tablet 11  . calcipotriene (DOVONOX) 0.005 % cream daily.     . calcium carbonate (OS-CAL - DOSED IN MG OF ELEMENTAL CALCIUM) 1250 (500 Ca) MG tablet     . calcium carbonate (TUMS - DOSED IN MG ELEMENTAL CALCIUM) 500 MG chewable tablet Chew 1 tablet by mouth daily.    . Cholecalciferol (VITAMIN D-1000 MAX ST) 1000 units tablet Take 1,000 Units by mouth daily.    . cyanocobalamin 1000 MCG tablet Take 1,000 mcg by mouth daily.    Marland Kitchen DARATUMUMAB IV Inject into the vein every 30 (thirty) days.    . diphenoxylate-atropine (LOMOTIL) 2.5-0.025 MG tablet Take 1 tablet by mouth 4 (four) times daily as needed for diarrhea or loose stools. 60 tablet 0  . docusate sodium (COLACE) 100 MG capsule Take 100 mg by mouth daily as needed for mild constipation.     Marland Kitchen estradiol (ESTRACE VAGINAL) 0.1 MG/GM vaginal cream Place 1 Applicatorful vaginally at bedtime. 42.5 g 12  . loperamide (IMODIUM) 2 MG capsule Take by mouth as needed for diarrhea or loose stools. As needed only    . metoprolol tartrate (LOPRESSOR) 25 MG tablet TAKE 1.5 TABLETS (37.5 MG TOTAL) BY MOUTH 2 (TWO) TIMES DAILY. 270 tablet 0  . mirtazapine (REMERON) 15 MG tablet Take 1 tablet (15 mg total) by mouth at bedtime. 90 tablet 1  . Multiple Vitamin (MULTIVITAMIN WITH MINERALS) TABS tablet Take 1 tablet by mouth daily.    .  ondansetron (ZOFRAN) 8 MG tablet Take 1 tablet (8 mg total) by mouth every 8 (eight) hours as needed (Nausea or vomiting). 30 tablet 1  . pantoprazole (PROTONIX) 40 MG tablet TAKE 1 TABLET (40 MG TOTAL) BY MOUTH 2 (TWO) TIMES DAILY. TAKE 30-60 MINUTES BEFORE BREAKFAST AND DINNER. 180 tablet 2  . pomalidomide (POMALYST) 2 MG capsule Take 1 capsule (2 mg total) by mouth daily. 1 capsule daily on days 1-21, then 7 days off. Repeat every 28 days. 21 capsule 0   No current facility-administered medications for this visit.    PHYSICAL EXAMINATION: ECOG PERFORMANCE STATUS: 1 - Symptomatic but completely ambulatory  Vitals:   09/21/20 0801  BP: (!) 141/51  Pulse: 67  Resp: 16  Temp: (!) 97.3 F (36.3 C)  SpO2: 100%   Filed Weights   09/21/20 0801  Weight: 163  lb 12.8 oz (74.3 kg)    GENERAL:alert, no distress and comfortable  NEURO: alert & oriented x 3 with fluent speech, no focal motor/sensory deficits  LABORATORY DATA:  I have reviewed the data as listed    Component Value Date/Time   NA 138 08/16/2020 0820   NA 142 01/04/2017 0902   K 3.7 08/16/2020 0820   K 3.9 01/04/2017 0902   CL 104 08/16/2020 0820   CO2 26 08/16/2020 0820   CO2 28 01/04/2017 0902   GLUCOSE 92 08/16/2020 0820   GLUCOSE 92 01/04/2017 0902   BUN 25 (H) 08/16/2020 0820   BUN 9.5 01/04/2017 0902   CREATININE 0.83 08/16/2020 0820   CREATININE 0.80 12/18/2018 0901   CREATININE 0.7 01/04/2017 0902   CALCIUM 9.1 08/16/2020 0820   CALCIUM 9.0 01/04/2017 0902   PROT 8.3 (H) 08/16/2020 0820   PROT 6.0 (L) 01/04/2017 0902   PROT 5.8 (L) 01/04/2017 0902   ALBUMIN 3.7 08/16/2020 0820   ALBUMIN 3.4 (L) 01/04/2017 0902   AST 27 08/16/2020 0820   AST 18 12/18/2018 0901   AST 21 01/04/2017 0902   ALT 27 08/16/2020 0820   ALT 14 12/18/2018 0901   ALT 16 01/04/2017 0902   ALKPHOS 69 08/16/2020 0820   ALKPHOS 101 01/04/2017 0902   BILITOT 1.1 08/16/2020 0820   BILITOT 0.3 12/18/2018 0901   BILITOT 0.56  01/04/2017 0902   GFRNONAA >60 08/16/2020 0820   GFRNONAA >60 12/18/2018 0901   GFRAA >60 03/25/2020 0917   GFRAA >60 12/18/2018 0901    No results found for: SPEP, UPEP  Lab Results  Component Value Date   WBC 3.6 (L) 09/21/2020   NEUTROABS PENDING 09/21/2020   HGB 12.5 09/21/2020   HCT 39.0 09/21/2020   MCV 112.4 (H) 09/21/2020   PLT 185 09/21/2020      Chemistry      Component Value Date/Time   NA 138 08/16/2020 0820   NA 142 01/04/2017 0902   K 3.7 08/16/2020 0820   K 3.9 01/04/2017 0902   CL 104 08/16/2020 0820   CO2 26 08/16/2020 0820   CO2 28 01/04/2017 0902   BUN 25 (H) 08/16/2020 0820   BUN 9.5 01/04/2017 0902   CREATININE 0.83 08/16/2020 0820   CREATININE 0.80 12/18/2018 0901   CREATININE 0.7 01/04/2017 0902      Component Value Date/Time   CALCIUM 9.1 08/16/2020 0820   CALCIUM 9.0 01/04/2017 0902   ALKPHOS 69 08/16/2020 0820   ALKPHOS 101 01/04/2017 0902   AST 27 08/16/2020 0820   AST 18 12/18/2018 0901   AST 21 01/04/2017 0902   ALT 27 08/16/2020 0820   ALT 14 12/18/2018 0901   ALT 16 01/04/2017 0902   BILITOT 1.1 08/16/2020 0820   BILITOT 0.3 12/18/2018 0901   BILITOT 0.56 01/04/2017 0902

## 2020-09-23 ENCOUNTER — Encounter: Payer: Self-pay | Admitting: Hematology and Oncology

## 2020-09-25 NOTE — Progress Notes (Signed)
Cardiology Office Note    Date:  09/29/2020   ID:  Jean Davidson, DOB 1945/10/14, MRN [id number]  PCP:  Haywood Pao, MD  Cardiologist:  Kathlyn Sacramento, MD  Electrophysiologist:  None   Chief Complaint: Follow-up  History of Present Illness:   Jean Davidson is a 75 y.o. female with history of paroxysmal SVT, multiple myeloma status post bone marrow transplant in 2016, MDS status post bone marrow transplant in 06/2018, pericardial effusion without intervention, HTN, palpitations, anemia, and cervical spine stenosis who presents for follow-up of SVT.  She was previously seen by Dr. Fletcher Anon in 2016 for palpitations with Holter monitor showing NSR with one PVC as well as several PACs. Echo was overall normal as was a treadmill stress test. In the setting of hypertension, she was started on Coreg. She has known multiple myeloma with relapsing disease noted in 2016. She is followed by [facility]. At some point, she developed a large pericardial effusion and was seen by [facility] cardiology with initial plan of pericardiocentesis, though the effusion decreased in size without intervention. Echo in 2018 showed a small pericardial effusion.   She was seen in 10/2018 with increasing palpitations.  Ziopatch showed a predominant rhythm of sinus with an average heart rate of 71 bpm (range 51 (2:47 AM) -167 bpm), 34 SVT runs occurred with the fastest interval lasting 4 beats with a maximum rate of 167 bpm and the longest interval lasting 15 beats with an average heart rate of 136 bpm, isolated PACs, atrial couplet and triplets were rare, isolated PVCs and ventricular triplets were rare. There were no patient triggered events. Given her history of pericardial effusion she underwent repeat echo in 01/2019 which showed an EF of 55 to 60%, normal LV cavity size, normal LV diastolic function, normal RV systolic function, normal RV cavity size, mildly elevated PASP of 38.3 mmHg, mildly dilated left atrium, mild mitral  regurgitation, mild tricuspid regurgitation, and trivial pericardial effusion.  She was transitioned from carvedilol to metoprolol with improvement in her symptoms.  She was seen in 05/2019 during episodes of left calf discomfort with ultrasound being negative for DVT and ABIs normal bilaterally.  She was seen in 12/2019 with atypical chest pain, palpitations, and fatigue.  Due to the murmur noted on exam she underwent echo in 01/2020 which showed an EF of 60 to 65%, no regional wall motion abnormalities, normal LV diastolic function parameters, normal RV systolic function and ventricular cavity size, normal PASP, mild mitral regurgitation, an estimated right atrial pressure of 3 mmHg, and no evidence of pericardial effusion.  She was last seen in the office for follow-up in 02/2020 with continued fatigue which was felt to be in the setting of her underlying stressors/comorbid conditions/multiple myeloma therapy.  No changes were indicated.  She comes in doing well from a cardiac perspective.  No tachypalpitations, chest pain, dyspnea, dizziness, presyncope, or syncope.  She does continue to note longstanding fatigue though this is stable and felt to be in the setting of her comorbid conditions with associated therapy.  She did briefly have to suspend her therapy in the setting of ITP with platelet count subsequently normalizing with IVIG.  She does remain on full dose aspirin at the direction of her oncologist.  She does not have any issues or concerns at this time.   Labs independently reviewed: 08/2020 - Hgb 12.5, PLT 185 07/2020 - potassium 3.7, BUN 25, serum creatinine 0.83, albumin 3.7, AST/ALT normal 06/2020 - magnesium 2.0 11/2018 -  TSH normal  Past Medical History:  Diagnosis Date  . Anemia   . Anxiety   . Blood transfusion without reported diagnosis   . Carotid stenosis    Mild bilateral  . Carpal tunnel syndrome   . Cataract   . Cervical disc disease    c7  . Cough 07/27/2015  . Disc  degeneration   . Dysuria 07/26/2015  . 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 Fatigue 01/26/2015  . Fever 07/27/2015  . GERD (gastroesophageal reflux disease)   . 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 Hypertension    Borderline.  . Internal and external hemorrhoids without complication   . Macular degeneration    dry  . MDS (myelodysplastic syndrome) ([place])    after stem cell for multiple myeloma, then got allogeneic BMT  . Multiple myeloma ([place])   . Osteopenia   . Pinched nerve   . Sinusitis, bacterial 07/27/2015  . Spinal stenosis in cervical region   . Varicose veins of lower extremities with inflammation     Past Surgical History:  Procedure Laterality Date  . BLADDER SUSPENSION  1988/1989   x2  . COLONOSCOPY    . ESOPHAGOGASTRODUODENOSCOPY    . IR IMAGING GUIDED PORT INSERTION  03/01/2018  . LIMBAL STEM CELL TRANSPLANT     x2  . PORT-A-CATH REMOVAL    . SHOULDER SURGERY Right 04/06/13   right  . UPPER GASTROINTESTINAL ENDOSCOPY      Current Medications: Current Meds  Medication Sig  . acyclovir (ZOVIRAX) 400 MG tablet TAKE 1 TABLET BY MOUTH TWICE A DAY  . aspirin 325 MG tablet Take 325 mg by mouth daily.  . calcipotriene (DOVONOX) 0.005 % cream daily.   . calcium carbonate (OS-CAL - DOSED IN MG OF ELEMENTAL CALCIUM) 1250 (500 Ca) MG tablet Take 1 tablet by mouth daily with breakfast.  . calcium carbonate (TUMS - DOSED IN MG ELEMENTAL CALCIUM) 500 MG chewable tablet Chew 1 tablet by mouth daily.  . Cholecalciferol 25 MCG (1000 UT) tablet Take 1,000 Units by mouth daily.  . cyanocobalamin 1000 MCG tablet Take 1,000 mcg by mouth daily.  Marland Kitchen DARATUMUMAB IV Inject into the vein every 30 (thirty) days.  . diphenoxylate-atropine (LOMOTIL) 2.5-0.025 MG tablet Take 1 tablet by mouth 4 (four) times daily as needed for diarrhea or loose stools.  . docusate sodium (COLACE) 100 MG capsule Take 100 mg by mouth daily as needed for mild constipation.   Marland Kitchen estradiol (ESTRACE VAGINAL) 0.1  MG/GM vaginal cream Place 1 Applicatorful vaginally at bedtime.  Marland Kitchen loperamide (IMODIUM) 2 MG capsule Take by mouth as needed for diarrhea or loose stools. As needed only  . metoprolol tartrate (LOPRESSOR) 25 MG tablet TAKE 1.5 TABLETS (37.5 MG TOTAL) BY MOUTH 2 (TWO) TIMES DAILY.  . mirtazapine (REMERON) 15 MG tablet Take 1 tablet (15 mg total) by mouth at bedtime.  . Multiple Vitamin (MULTIVITAMIN WITH MINERALS) TABS tablet Take 1 tablet by mouth daily.  . ondansetron (ZOFRAN) 8 MG tablet Take 1 tablet (8 mg total) by mouth every 8 (eight) hours as needed (Nausea or vomiting).  . pantoprazole (PROTONIX) 40 MG tablet TAKE 1 TABLET (40 MG TOTAL) BY MOUTH 2 (TWO) TIMES DAILY. TAKE 30-60 MINUTES BEFORE BREAKFAST AND DINNER.  Marland Kitchen pomalidomide (POMALYST) 2 MG capsule Take 1 capsule (2 mg total) by mouth daily. 1 capsule daily on days 1-21, then 7 days off. Repeat every 28 days.    Allergies:   Patient has no  known allergies.   Social History   Socioeconomic History  . Marital status: Divorced    Spouse name: Not on file  . Number of children: Not on file  . Years of education: Not on file  . Highest education level: Not on file  Occupational History  . Not on file  Tobacco Use  . Smoking status: Former Smoker    Packs/day: 0.50    Years: 4.00    Pack years: 2.00    Quit date: 06/26/1968    Years since quitting: 52.2  . Smokeless tobacco: Never Used  Vaping Use  . Vaping Use: Never used  Substance and Sexual Activity  . Alcohol use: Yes    Alcohol/week: 1.0 standard drink    Types: 1 Cans of beer per week    Comment: 1 per week per pt  . Drug use: No  . Sexual activity: Not on file  Other Topics Concern  . Not on file  Social History Narrative   Chauncey Reading is daughter rebel, laughridge will,  full code ( reviewed 51)   Social Determinants of Health   Financial Resource Strain: Not on file  Food Insecurity: Not on file  Transportation Needs: Not on file  Physical Activity: Not  on file  Stress: Not on file  Social Connections: Not on file     Family History:  The patient's family history includes Colon cancer (age of onset: 29) in her paternal aunt; Diabetes in her father; Heart disease in her brother, father, and mother; Heart failure in her father; Hypertension in her brother; Kidney disease in her father; Skin cancer in her brother and father; Throat cancer in her father. There is no history of Stomach cancer, Esophageal cancer, or Rectal cancer.  ROS:   Review of Systems  Constitutional: Positive for malaise/fatigue. Negative for chills, diaphoresis, fever and weight loss.  HENT: Negative for congestion.   Eyes: Negative for discharge and redness.  Respiratory: Negative for cough, hemoptysis, sputum production, shortness of breath and wheezing.   Cardiovascular: Negative for chest pain, palpitations, orthopnea, claudication, leg swelling and PND.  Gastrointestinal: Negative for abdominal pain, blood in stool, heartburn, melena, nausea and vomiting.  Genitourinary: Negative for hematuria.  Musculoskeletal: Negative for falls and myalgias.  Skin: Negative for rash.  Neurological: Negative for dizziness, tingling, tremors, sensory change, speech change, focal weakness, loss of consciousness and weakness.  Endo/Heme/Allergies: Does not bruise/bleed easily.  Psychiatric/Behavioral: Negative for substance abuse. The patient is not nervous/anxious.   All other systems reviewed and are negative.    EKGs/Labs/Other Studies Reviewed:    Studies reviewed were summarized above. The additional studies were reviewed today: As above.  EKG:  EKG is ordered today.  The EKG ordered today demonstrates sinus bradycardia, 59 bpm, low voltage QRS, no acute ST-T changes, no significant changes compared to prior  Recent Labs: 08/16/2020: ALT 27; BUN 25; Creatinine, Ser 0.83; Potassium 3.7; Sodium 138 09/21/2020: Hemoglobin 12.5; Platelets 185  Recent Lipid Panel    Component  Value Date/Time   CHOL 110 08/08/2013 1127   TRIG 82.0 08/08/2013 1127   HDL 36.50 (L) 08/08/2013 1127   CHOLHDL 3 08/08/2013 1127   VLDL 16.4 08/08/2013 1127   LDLCALC 57 08/08/2013 1127    PHYSICAL EXAM:    VS:  BP 110/70 (BP Location: Left Arm, Patient Position: Sitting, Cuff Size: Normal)   Pulse (!) 59   Ht '5\' 2"'  (1.575 m)   Wt 163 lb 8 oz (74.2 kg)   SpO2  98%   BMI 29.90 kg/m   BMI: Body mass index is 29.9 kg/m.  Physical Exam Vitals reviewed.  Constitutional:      Appearance: She is well-developed.  HENT:     Head: Normocephalic and atraumatic.  Eyes:     General:        Right eye: No discharge.        Left eye: No discharge.  Neck:     Vascular: No JVD.  Cardiovascular:     Rate and Rhythm: Normal rate and regular rhythm.     Pulses: No midsystolic click and no opening snap.          Posterior tibial pulses are 2+ on the right side and 2+ on the left side.     Heart sounds: Normal heart sounds, S1 normal and S2 normal. Heart sounds not distant. No murmur heard. No friction rub.  Pulmonary:     Effort: Pulmonary effort is normal. No respiratory distress.     Breath sounds: Normal breath sounds. No decreased breath sounds, wheezing or rales.  Chest:     Chest wall: No tenderness.  Abdominal:     General: There is no distension.     Palpations: Abdomen is soft.     Tenderness: There is no abdominal tenderness.  Musculoskeletal:     Cervical back: Normal range of motion.  Skin:    General: Skin is warm and dry.     Nails: There is no clubbing.  Neurological:     Mental Status: She is alert and oriented to person, place, and time.  Psychiatric:        Speech: Speech normal.        Behavior: Behavior normal.        Thought Content: Thought content normal.        Judgment: Judgment normal.     Wt Readings from Last 3 Encounters:  09/29/20 163 lb 8 oz (74.2 kg)  09/21/20 163 lb 12.8 oz (74.3 kg)  08/30/20 159 lb 12.8 oz (72.5 kg)     ASSESSMENT &  PLAN:   1. Paroxysmal SVT: No symptoms of recurrent tachypalpitations.  Continue Lopressor 37.5 mg twice daily.  2. History of pericardial effusion: No evidence of effusion noted on most recent echo in 01/2020.  3. HTN: Blood pressure is well controlled in office today.  She remains on metoprolol tartrate as outlined above.  4. Multiple myeloma/MDS: Followed by [facility].  Remains on full dose aspirin per oncology.  5. Chronic fatigue: Longstanding and stable.  Felt to be in the setting of her comorbid conditions with associated therapies.  Declines tapering of metoprolol given stable symptoms.  Disposition: F/u with Dr. Fletcher Anon or an APP in 6 months.   Medication Adjustments/Labs and Tests Ordered: Current medicines are reviewed at length with the patient today.  Concerns regarding medicines are outlined above. Medication changes, Labs and Tests ordered today are summarized above and listed in the Patient Instructions accessible in Encounters.   Signed, Christell Faith, PA-C 09/29/2020 11:16 AM     [facility] [address] [place] [address] [place], [place] [address] [phone]

## 2020-09-29 ENCOUNTER — Ambulatory Visit: Payer: Medicare HMO | Admitting: Physician Assistant

## 2020-09-29 ENCOUNTER — Other Ambulatory Visit: Payer: Self-pay

## 2020-09-29 ENCOUNTER — Ambulatory Visit: Payer: Medicare HMO | Admitting: Family

## 2020-09-29 ENCOUNTER — Encounter: Payer: Self-pay | Admitting: Physician Assistant

## 2020-09-29 VITALS — BP 110/70 | HR 59 | Ht 62.0 in | Wt 163.5 lb

## 2020-09-29 DIAGNOSIS — I10 Essential (primary) hypertension: Secondary | ICD-10-CM | POA: Diagnosis not present

## 2020-09-29 DIAGNOSIS — I471 Supraventricular tachycardia: Secondary | ICD-10-CM

## 2020-09-29 DIAGNOSIS — R5383 Other fatigue: Secondary | ICD-10-CM

## 2020-09-29 DIAGNOSIS — I313 Pericardial effusion (noninflammatory): Secondary | ICD-10-CM | POA: Diagnosis not present

## 2020-09-29 DIAGNOSIS — I3139 Other pericardial effusion (noninflammatory): Secondary | ICD-10-CM

## 2020-09-29 DIAGNOSIS — C9000 Multiple myeloma not having achieved remission: Secondary | ICD-10-CM

## 2020-09-29 NOTE — Patient Instructions (Signed)

## 2020-10-05 ENCOUNTER — Other Ambulatory Visit: Payer: Self-pay

## 2020-10-05 MED ORDER — POMALIDOMIDE 2 MG PO CAPS: 2.0000 mg | ORAL_CAPSULE | Freq: Every day | ORAL | 0 refills | Status: DC

## 2020-10-13 ENCOUNTER — Encounter: Payer: Self-pay | Admitting: Hematology and Oncology

## 2020-10-13 ENCOUNTER — Inpatient Hospital Stay: Payer: Medicare HMO | Attending: Hematology and Oncology

## 2020-10-13 ENCOUNTER — Other Ambulatory Visit: Payer: Self-pay

## 2020-10-13 ENCOUNTER — Inpatient Hospital Stay: Payer: Medicare HMO

## 2020-10-13 ENCOUNTER — Inpatient Hospital Stay: Payer: Medicare HMO | Admitting: Hematology and Oncology

## 2020-10-13 VITALS — BP 121/62 | HR 55 | Temp 98.6°F | Resp 18

## 2020-10-13 DIAGNOSIS — M8580 Other specified disorders of bone density and structure, unspecified site: Secondary | ICD-10-CM | POA: Insufficient documentation

## 2020-10-13 DIAGNOSIS — R0602 Shortness of breath: Secondary | ICD-10-CM

## 2020-10-13 DIAGNOSIS — Z5112 Encounter for antineoplastic immunotherapy: Secondary | ICD-10-CM | POA: Diagnosis present

## 2020-10-13 DIAGNOSIS — Z79899 Other long term (current) drug therapy: Secondary | ICD-10-CM | POA: Insufficient documentation

## 2020-10-13 DIAGNOSIS — C9000 Multiple myeloma not having achieved remission: Secondary | ICD-10-CM | POA: Diagnosis not present

## 2020-10-13 DIAGNOSIS — D61818 Other pancytopenia: Secondary | ICD-10-CM

## 2020-10-13 DIAGNOSIS — C9002 Multiple myeloma in relapse: Secondary | ICD-10-CM | POA: Insufficient documentation

## 2020-10-13 DIAGNOSIS — Z7982 Long term (current) use of aspirin: Secondary | ICD-10-CM | POA: Insufficient documentation

## 2020-10-13 DIAGNOSIS — D469 Myelodysplastic syndrome, unspecified: Secondary | ICD-10-CM

## 2020-10-13 LAB — COMPREHENSIVE METABOLIC PANEL
ALT: 14 U/L (ref 0–44)
AST: 13 U/L — ABNORMAL LOW (ref 15–41)
Albumin: 3.5 g/dL (ref 3.5–5.0)
Alkaline Phosphatase: 68 U/L (ref 38–126)
Anion gap: 9 (ref 5–15)
BUN: 17 mg/dL (ref 8–23)
CO2: 26 mmol/L (ref 22–32)
Calcium: 9.1 mg/dL (ref 8.9–10.3)
Chloride: 106 mmol/L (ref 98–111)
Creatinine, Ser: 0.74 mg/dL (ref 0.44–1.00)
GFR, Estimated: >60 mL/min (ref >60)
Glucose, Bld: 86 mg/dL (ref 70–99)
Potassium: 4 mmol/L (ref 3.5–5.1)
Sodium: 141 mmol/L (ref 135–145)
Total Bilirubin: <0.2 mg/dL — ABNORMAL LOW (ref 0.3–1.2)
Total Protein: 6.3 g/dL — ABNORMAL LOW (ref 6.5–8.1)

## 2020-10-13 LAB — CBC WITH DIFFERENTIAL/PLATELET
Abs Immature Granulocytes: 0 10*3/uL (ref 0.00–0.07)
Basophils Absolute: 0 10*3/uL (ref 0.0–0.1)
Basophils Relative: 1 %
Eosinophils Absolute: 0.1 10*3/uL (ref 0.0–0.5)
Eosinophils Relative: 1 %
HCT: 39.2 % (ref 36.0–46.0)
Hemoglobin: 12.9 g/dL (ref 12.0–15.0)
Immature Granulocytes: 0 %
Lymphocytes Relative: 55 %
Lymphs Abs: 2.8 10*3/uL (ref 0.7–4.0)
MCH: 35.6 pg — ABNORMAL HIGH (ref 26.0–34.0)
MCHC: 32.9 g/dL (ref 30.0–36.0)
MCV: 108.3 fL — ABNORMAL HIGH (ref 80.0–100.0)
Monocytes Absolute: 0.6 10*3/uL (ref 0.1–1.0)
Monocytes Relative: 13 %
Neutro Abs: 1.5 10*3/uL — ABNORMAL LOW (ref 1.7–7.7)
Neutrophils Relative %: 30 %
Platelets: 131 10*3/uL — ABNORMAL LOW (ref 150–400)
RBC: 3.62 MIL/uL — ABNORMAL LOW (ref 3.87–5.11)
RDW: 12.4 % (ref 11.5–15.5)
WBC: 5 10*3/uL (ref 4.0–10.5)
nRBC: 0 % (ref 0.0–0.2)

## 2020-10-13 MED ORDER — SODIUM CHLORIDE 0.9 % IV SOLN
Freq: Once | INTRAVENOUS | Status: AC
  Filled 2020-10-13: qty 250

## 2020-10-13 MED ORDER — FAMOTIDINE IN NACL 20-0.9 MG/50ML-% IV SOLN
20.0000 mg | Freq: Once | INTRAVENOUS | Status: AC
  Administered 2020-10-13: 20 mg via INTRAVENOUS

## 2020-10-13 MED ORDER — SODIUM CHLORIDE 0.9% FLUSH
10.0000 mL | INTRAVENOUS | Status: DC | PRN
  Administered 2020-10-13: 10 mL
  Filled 2020-10-13: qty 10

## 2020-10-13 MED ORDER — ACETAMINOPHEN 325 MG PO TABS
650.0000 mg | ORAL_TABLET | Freq: Once | ORAL | Status: AC
  Administered 2020-10-13: 650 mg via ORAL

## 2020-10-13 MED ORDER — ONDANSETRON HCL 4 MG PO TABS: 4.0000 mg | ORAL_TABLET | Freq: Three times a day (TID) | ORAL | 1 refills | Status: DC | PRN

## 2020-10-13 MED ORDER — SODIUM CHLORIDE 0.9 % IV SOLN
16.0000 mg/kg | Freq: Once | INTRAVENOUS | Status: AC
  Administered 2020-10-13: 1200 mg via INTRAVENOUS
  Filled 2020-10-13: qty 60

## 2020-10-13 MED ORDER — FAMOTIDINE IN NACL 20-0.9 MG/50ML-% IV SOLN
INTRAVENOUS | Status: AC
  Filled 2020-10-13: qty 50

## 2020-10-13 MED ORDER — HEPARIN SOD (PORK) LOCK FLUSH 100 UNIT/ML IV SOLN
500.0000 [IU] | Freq: Once | INTRAVENOUS | Status: AC | PRN
  Administered 2020-10-13: 500 [IU]
  Filled 2020-10-13: qty 5

## 2020-10-13 MED ORDER — ACETAMINOPHEN 325 MG PO TABS
ORAL_TABLET | ORAL | Status: AC
  Filled 2020-10-13: qty 2

## 2020-10-13 MED ORDER — SODIUM CHLORIDE 0.9% FLUSH
10.0000 mL | Freq: Once | INTRAVENOUS | Status: AC
  Administered 2020-10-13: 10 mL
  Filled 2020-10-13: qty 10

## 2020-10-13 NOTE — Assessment & Plan Note (Signed)
She was seen by cardiologist recently She will continue metoprolol I suspect majority of her symptoms are due to deconditioning We discussed the importance of gradual exercise as tolerated

## 2020-10-13 NOTE — Assessment & Plan Note (Signed)
Recent myeloma panel showed no detectable M spike and with normal light chain ratio She tolerated resumption of Pomalyst and daratumumab well She will continue aspirin for DVT prophylaxis and acyclovir for antimicrobial prophylaxis We have discontinue Zometa due to long-term use She will continue calcium with vitamin D and she will be due for another bone density scan November of this year

## 2020-10-13 NOTE — Progress Notes (Signed)
[place] OFFICE PROGRESS NOTE  Patient Care Team: [name], [name], MD as PCP - General (Internal Medicine) [name], MD as PCP - Cardiology (Cardiology) [name], MD as Referring Physician (Hematology and Oncology) [name], MD as Consulting Physician (Internal Medicine) [name], [name], MD as Consulting Physician (Infectious Diseases) [name], MD as Consulting Physician (Hematology and Oncology) [name], NP as Nurse Practitioner (Hematology and Oncology)  ASSESSMENT & PLAN:  Multiple myeloma [facility]) Recent myeloma panel showed no detectable M spike and with normal light chain ratio She tolerated resumption of Pomalyst and daratumumab well She will continue aspirin for DVT prophylaxis and acyclovir for antimicrobial prophylaxis We have discontinue Zometa due to long-term use She will continue calcium with vitamin D and she will be due for another bone density scan November of this year  Pancytopenia, acquired [facility]) She has acquired pancytopenia due to treatment She is not symptomatic Observe only  Exertional shortness of breath She was seen by cardiologist recently She will continue metoprolol I suspect majority of her symptoms are due to deconditioning We discussed the importance of gradual exercise as tolerated   No orders of the defined types were placed in this encounter.   All questions were answered. The patient knows to call the clinic with any problems, questions or concerns. The total time spent in the appointment was 20 minutes encounter with patients including review of chart and various tests results, discussions about plan of care and coordination of care plan   [name], MD 10/13/2020 11:00 AM  INTERVAL HISTORY: Please see below for problem oriented charting. She returns for further follow-up She is doing well She is attempting to exercise more She does get tired more with  reintroduction of treatment No recent infection, fever or chills I have reviewed documentation from her recent visit to cardiology  SUMMARY OF ONCOLOGIC HISTORY: Oncology History Overview Note  Multiple myeloma, Ig A Lambda, M spike 3.54 grams, Calcium 9.2, Creatinine 0.8, Beta 2 microglobulin 4.52, IgA 4840 mg/dL, lambda light chain 75.4, albumin 3.6, hemoglobin 9.7, platelet 115    Primary site: Multiple Myeloma   Staging method: AJCC 6th Edition   Clinical: Stage IIA signed by [name], MD on 11/07/2013  2:46 PM   Summary: Stage IIA      Multiple myeloma ([place])  10/31/2013 Bone Marrow Biopsy   Bone marrow biopsy confirmed multiple myeloma with 40% bone marrow involvement. Skeletal survey showed minimal lesions in her score with generalized demineralization   11/10/2013 - 02/13/2014 Chemotherapy   The patient is started on induction chemotherapy with weekly dexamethasone 40 mg by mouth as well as Velcade subcutaneous injection on days 1, 4, 8 and 11. On 11/21/2013, she was started on monthly Zometa.   12/23/2013 Adverse Reaction   The dose of Velcade was reduced due to thrombocytopenia.   01/28/2014 - 04/07/2014 Chemotherapy   Revlimid is added. Treatment was discontinued due to lack of response.   02/24/2014 - 04/07/2014 Chemotherapy   Due to worsening peripheral neuropathy, Velcade injection is changed to once a week. Revlimid was given 21 days on, 7 days off.   04/07/2014 - 04/10/2014 Chemotherapy   Revlimid was discontinued due to lack of response. Chemotherapy was changed back to Velcade injection twice a week, 2 weeks on 1 week off. Her treatment was switched to to minimum response   04/20/2014 - 06/02/2014 Chemotherapy   chemotherapy is switched to Carfilzomib, Cytoxan and dexamethasone.  04/22/2014 Procedure   she has placement of port for chemotherapy.   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 [facility]   10/20/2014 - 03/24/2015 Chemotherapy    she received chemotherapy with Kyprolis, Revlimid and dexamethasone   10/22/2014 Procedure   She has port placement   01/19/2015 Tumor Marker   IgA lambda M spike at 0.4 g    01/20/2015 Miscellaneous   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 No cource was found and fever resolved with IV vancomycin and meropenem   09/13/2015 Bone Marrow Biopsy   Bone marrow biopsy showed no increased blasts, 3-4 % plasma cells   03/02/2016 Bone Marrow Biopsy   Bone marrow biopsy at [facility] showed normocellular (30%) bone marrow with trilineage hematopoiesis. No significant increase in blasts. No significant increase in plasma cells.   05/12/2016 Imaging   DEXA scan at [place] showed osteopenia   10/24/2016 Imaging   Skeletal survey at [facility], no new lesions   12/07/2017 Imaging   No focal abnormality noted to suggest myeloma. Exam is stable from prior exam.   03/01/2018 Procedure   Successful 8 French right internal jugular vein power port placement with its tip at the SVC/RA junction.   03/06/2018 -  Chemotherapy   The patient had daratumumab    07/09/2018 Bone Marrow Biopsy   Bone marrow biopsy at [facility]  showed residual disease at 0.004% plasma cells   07/03/2019 Bone Marrow Biopsy   A. Bone marrow, flow cytometric analysis for multiple myeloma minimal residual disease detection:   Negative. No phenotypically abnormal plasma cells at or above the limit of detection identified.     No monotypic B-cell population identified. Negative for increased blasts.   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   10/31/2015 Miscellaneous   She has engrafted     REVIEW OF SYSTEMS:   Constitutional: Denies fevers, chills or abnormal weight loss Eyes: Denies blurriness of vision Ears, nose, mouth, throat, and face: Denies mucositis or sore throat Respiratory: Denies cough, dyspnea or wheezes Cardiovascular: Denies palpitation, chest discomfort or lower extremity swelling Gastrointestinal:  Denies nausea, heartburn or change in bowel habits Skin:  Denies abnormal skin rashes Lymphatics: Denies new lymphadenopathy or easy bruising Neurological:Denies numbness, tingling or new weaknesses Behavioral/Psych: Mood is stable, no new changes  All other systems were reviewed with the patient and are negative.  I have reviewed the past medical history, past surgical history, social history and family history with the patient and they are unchanged from previous note.  ALLERGIES:  has No Known Allergies.  MEDICATIONS:   Current Outpatient Medications  Medication Sig Dispense Refill  . ondansetron (ZOFRAN) 4 MG tablet Take 1 tablet (4 mg total) by mouth every 8 (eight) hours as needed for nausea. 30 tablet 1  . acyclovir (ZOVIRAX) 400 MG tablet TAKE 1 TABLET BY MOUTH TWICE A DAY 180 tablet 11  . aspirin 325 MG tablet Take 325 mg by mouth daily.    . calcipotriene (DOVONOX) 0.005 % cream daily.     . calcium carbonate (OS-CAL - DOSED IN MG OF ELEMENTAL CALCIUM) 1250 (500 Ca) MG tablet Take 1 tablet by mouth daily with breakfast.    . calcium carbonate (TUMS - DOSED IN MG ELEMENTAL CALCIUM) 500 MG chewable tablet Chew 1 tablet by mouth daily.    . Cholecalciferol 25 MCG (1000 UT) tablet Take 1,000 Units by mouth daily.    . cyanocobalamin 1000 MCG tablet Take 1,000 mcg by mouth daily.    Marland Kitchen DARATUMUMAB IV Inject into the vein every 30 (thirty) days.    . diphenoxylate-atropine (LOMOTIL) 2.5-0.025 MG tablet Take 1 tablet by mouth 4 (four) times daily as needed for diarrhea or loose stools. 60 tablet 0  . docusate sodium (COLACE) 100 MG capsule Take 100 mg by mouth daily as needed for mild constipation.     Marland Kitchen estradiol (ESTRACE VAGINAL) 0.1 MG/GM vaginal cream Place 1 Applicatorful vaginally at bedtime. 42.5 g 12  . loperamide (IMODIUM) 2 MG capsule Take by mouth as needed for diarrhea or loose stools. As needed only    . metoprolol tartrate (LOPRESSOR) 25 MG tablet TAKE 1.5 TABLETS (37.5 MG TOTAL) BY MOUTH 2 (TWO) TIMES DAILY. 270 tablet 0  . mirtazapine (REMERON) 15 MG tablet Take 1 tablet (15 mg total) by mouth at bedtime. 90 tablet 1  . Multiple Vitamin (MULTIVITAMIN WITH MINERALS) TABS tablet Take 1 tablet by mouth daily.    . ondansetron (ZOFRAN) 8 MG tablet Take 1 tablet (8 mg total) by mouth every 8 (eight) hours as needed (Nausea or vomiting). 30 tablet 1  . pantoprazole (PROTONIX) 40 MG tablet TAKE 1 TABLET (40 MG TOTAL) BY MOUTH 2 (TWO) TIMES DAILY. TAKE 30-60 MINUTES BEFORE BREAKFAST AND DINNER. 180  tablet 2  . pomalidomide (POMALYST) 2 MG capsule Take 1 capsule (2 mg total) by mouth daily. 1 capsule daily on days 1-21, then 7 days off. Repeat every 28 days. 21 capsule 0   No current facility-administered medications for this visit.    PHYSICAL EXAMINATION: ECOG PERFORMANCE STATUS: 1 - Symptomatic but completely ambulatory  Vitals:   10/13/20 1055  BP: 134/85  Pulse: (!) 57  Resp: 18  Temp: 98 F (36.7 C)  SpO2: 100%   Filed Weights   10/13/20 1055  Weight: 165 lb 12.8 oz (75.2 kg)    GENERAL:alert, no distress and comfortable SKIN: skin color, texture, turgor are normal, no rashes or significant lesions EYES: normal, Conjunctiva are pink and non-injected, sclera clear OROPHARYNX:no exudate, no erythema and lips, buccal mucosa, and tongue normal  NECK: supple, thyroid normal size, non-tender, without nodularity LYMPH:  no palpable lymphadenopathy  in the cervical, axillary or inguinal LUNGS: clear to auscultation and percussion with normal breathing effort HEART: regular rate & rhythm and no murmurs and no lower extremity edema ABDOMEN:abdomen soft, non-tender and normal bowel sounds Musculoskeletal:no cyanosis of digits and no clubbing  NEURO: alert & oriented x 3 with fluent speech, no focal motor/sensory deficits  LABORATORY DATA:  I have reviewed the data as listed    Component Value Date/Time   NA 138 08/16/2020 0820   NA 142 01/04/2017 0902   K 3.7 08/16/2020 0820   K 3.9 01/04/2017 0902   CL 104 08/16/2020 0820   CO2 26 08/16/2020 0820   CO2 28 01/04/2017 0902   GLUCOSE 92 08/16/2020 0820   GLUCOSE 92 01/04/2017 0902   BUN 25 (H) 08/16/2020 0820   BUN 9.5 01/04/2017 0902   CREATININE 0.83 08/16/2020 0820   CREATININE 0.80 12/18/2018 0901   CREATININE 0.7 01/04/2017 0902   CALCIUM 9.1 08/16/2020 0820   CALCIUM 9.0 01/04/2017 0902   PROT 8.3 (H) 08/16/2020 0820   PROT 6.0 (L) 01/04/2017 0902   PROT 5.8 (L) 01/04/2017 0902   ALBUMIN 3.7 08/16/2020  0820   ALBUMIN 3.4 (L) 01/04/2017 0902   AST 27 08/16/2020 0820   AST 18 12/18/2018 0901   AST 21 01/04/2017 0902   ALT 27 08/16/2020 0820   ALT 14 12/18/2018 0901   ALT 16 01/04/2017 0902   ALKPHOS 69 08/16/2020 0820   ALKPHOS 101 01/04/2017 0902   BILITOT 1.1 08/16/2020 0820   BILITOT 0.3 12/18/2018 0901   BILITOT 0.56 01/04/2017 0902   GFRNONAA >60 08/16/2020 0820   GFRNONAA >60 12/18/2018 0901   GFRAA >60 03/25/2020 0917   GFRAA >60 12/18/2018 0901    No results found for: SPEP, UPEP  Lab Results  Component Value Date   WBC 5.0 10/13/2020   NEUTROABS 1.5 (L) 10/13/2020   HGB 12.9 10/13/2020   HCT 39.2 10/13/2020   MCV 108.3 (H) 10/13/2020   PLT 131 (L) 10/13/2020      Chemistry      Component Value Date/Time   NA 138 08/16/2020 0820   NA 142 01/04/2017 0902   K 3.7 08/16/2020 0820   K 3.9 01/04/2017 0902   CL 104 08/16/2020 0820   CO2 26 08/16/2020 0820   CO2 28 01/04/2017 0902   BUN 25 (H) 08/16/2020 0820   BUN 9.5 01/04/2017 0902   CREATININE 0.83 08/16/2020 0820   CREATININE 0.80 12/18/2018 0901   CREATININE 0.7 01/04/2017 0902      Component Value Date/Time   CALCIUM 9.1 08/16/2020 0820   CALCIUM 9.0 01/04/2017 0902   ALKPHOS 69 08/16/2020 0820   ALKPHOS 101 01/04/2017 0902   AST 27 08/16/2020 0820   AST 18 12/18/2018 0901   AST 21 01/04/2017 0902   ALT 27 08/16/2020 0820   ALT 14 12/18/2018 0901   ALT 16 01/04/2017 0902   BILITOT 1.1 08/16/2020 0820   BILITOT 0.3 12/18/2018 0901   BILITOT 0.56 01/04/2017 0902

## 2020-10-13 NOTE — Assessment & Plan Note (Signed)
She has acquired pancytopenia due to treatment She is not symptomatic Observe only

## 2020-10-13 NOTE — Patient Instructions (Signed)
[facility] Discharge Instructions for Patients Receiving Chemotherapy  Today you received the following chemotherapy agents: darzalex  To help prevent nausea and vomiting after your treatment, we encourage you to take your nausea medication as directed    If you develop nausea and vomiting that is not controlled by your nausea medication, call the clinic.   BELOW ARE SYMPTOMS THAT SHOULD BE REPORTED IMMEDIATELY:  *FEVER GREATER THAN 100.5 F  *CHILLS WITH OR WITHOUT FEVER  NAUSEA AND VOMITING THAT IS NOT CONTROLLED WITH YOUR NAUSEA MEDICATION  *UNUSUAL SHORTNESS OF BREATH  *UNUSUAL BRUISING OR BLEEDING  TENDERNESS IN MOUTH AND THROAT WITH OR WITHOUT PRESENCE OF ULCERS  *URINARY PROBLEMS  *BOWEL PROBLEMS  UNUSUAL RASH Items with * indicate a potential emergency and should be followed up as soon as possible.  Feel free to call the clinic should you have any questions or concerns. The clinic phone number is [phone].  Please show the CHEMO ALERT CARD at check-in to the Emergency Department and triage nurse.   

## 2020-10-14 LAB — KAPPA/LAMBDA LIGHT CHAINS
Kappa free light chain: 21.6 mg/L — ABNORMAL HIGH (ref 3.3–19.4)
Kappa, lambda light chain ratio: 0.59 (ref 0.26–1.65)
Lambda free light chains: 36.4 mg/L — ABNORMAL HIGH (ref 5.7–26.3)

## 2020-10-15 LAB — MULTIPLE MYELOMA PANEL, SERUM
Albumin SerPl Elph-Mcnc: 3.2 g/dL (ref 2.9–4.4)
Albumin/Glob SerPl: 1.3 (ref 0.7–1.7)
Alpha 1: 0.2 g/dL (ref 0.0–0.4)
Alpha2 Glob SerPl Elph-Mcnc: 0.8 g/dL (ref 0.4–1.0)
B-Globulin SerPl Elph-Mcnc: 0.8 g/dL (ref 0.7–1.3)
Gamma Glob SerPl Elph-Mcnc: 0.7 g/dL (ref 0.4–1.8)
Globulin, Total: 2.6 g/dL (ref 2.2–3.9)
IgA: 243 mg/dL (ref 64–422)
IgG (Immunoglobin G), Serum: 618 mg/dL (ref 586–1602)
IgM (Immunoglobulin M), Srm: 47 mg/dL (ref 26–217)
M Protein SerPl Elph-Mcnc: 0.2 g/dL — ABNORMAL HIGH
Total Protein ELP: 5.8 g/dL — ABNORMAL LOW (ref 6.0–8.5)

## 2020-10-18 ENCOUNTER — Telehealth: Payer: Self-pay

## 2020-10-18 NOTE — Telephone Encounter (Signed)
-----   Message from [name], MD sent at 10/18/2020  8:03 AM EDT ----- Pls call and let her know Myeloma panel showed a bit of M protein, not worried about it Continue treatment, no change

## 2020-10-18 NOTE — Telephone Encounter (Signed)
Called and given below message. She verbalized understanding. 

## 2020-11-03 ENCOUNTER — Encounter: Payer: Self-pay | Admitting: Hematology and Oncology

## 2020-11-03 ENCOUNTER — Telehealth: Payer: Self-pay

## 2020-11-03 NOTE — Telephone Encounter (Signed)
No answer when called. Messaged back through [employer]

## 2020-11-03 NOTE — Telephone Encounter (Signed)
-----   Message from [name], MD sent at 11/03/2020  2:09 PM EDT ----- She sent a long msg about next week Tell her that she will be roomed to an exam room but we will connect virtually on Wednesday or I can see her virtually next Tuesday before chemo on Wednesday

## 2020-11-05 ENCOUNTER — Telehealth: Payer: Self-pay | Admitting: Hematology and Oncology

## 2020-11-05 NOTE — Telephone Encounter (Signed)
Scheduled appt per 5/13 sch msg. Pt aware.  

## 2020-11-09 ENCOUNTER — Inpatient Hospital Stay: Payer: Medicare HMO | Attending: Hematology and Oncology | Admitting: Hematology and Oncology

## 2020-11-09 ENCOUNTER — Encounter: Payer: Self-pay | Admitting: Hematology and Oncology

## 2020-11-09 DIAGNOSIS — M6289 Other specified disorders of muscle: Secondary | ICD-10-CM | POA: Diagnosis not present

## 2020-11-09 DIAGNOSIS — C9000 Multiple myeloma not having achieved remission: Secondary | ICD-10-CM

## 2020-11-09 DIAGNOSIS — Z5112 Encounter for antineoplastic immunotherapy: Secondary | ICD-10-CM | POA: Insufficient documentation

## 2020-11-09 DIAGNOSIS — R5383 Other fatigue: Secondary | ICD-10-CM

## 2020-11-09 DIAGNOSIS — Z7982 Long term (current) use of aspirin: Secondary | ICD-10-CM | POA: Insufficient documentation

## 2020-11-09 DIAGNOSIS — C9002 Multiple myeloma in relapse: Secondary | ICD-10-CM | POA: Insufficient documentation

## 2020-11-09 DIAGNOSIS — K2100 Gastro-esophageal reflux disease with esophagitis, without bleeding: Secondary | ICD-10-CM

## 2020-11-09 DIAGNOSIS — R197 Diarrhea, unspecified: Secondary | ICD-10-CM

## 2020-11-09 DIAGNOSIS — Z79899 Other long term (current) drug therapy: Secondary | ICD-10-CM | POA: Insufficient documentation

## 2020-11-09 DIAGNOSIS — K219 Gastro-esophageal reflux disease without esophagitis: Secondary | ICD-10-CM | POA: Insufficient documentation

## 2020-11-09 MED ORDER — PANTOPRAZOLE SODIUM 40 MG PO TBEC: 40.0000 mg | DELAYED_RELEASE_TABLET | Freq: Every day | ORAL | 2 refills | Status: DC

## 2020-11-09 NOTE — Assessment & Plan Note (Signed)
She has been taking high-dose pantoprazole for a long time Recommend consideration to reduce to once daily

## 2020-11-09 NOTE — Progress Notes (Signed)
HEMATOLOGY-ONCOLOGY ELECTRONIC VISIT PROGRESS NOTE  Patient Care Team: [name], [name], MD as PCP - General (Internal Medicine) [name], MD as PCP - Cardiology (Cardiology) [name], MD as Referring Physician (Hematology and Oncology) [name], MD as Consulting Physician (Internal Medicine) [name], [name], MD as Consulting Physician (Infectious Diseases) [name], MD as Consulting Physician (Hematology and Oncology) [name], NP as Nurse Practitioner (Hematology and Oncology)  I connected with  the patient by telephone call   ASSESSMENT & PLAN:  Multiple myeloma [facility]) Recent myeloma panel showed no detectable M spike and with normal light chain ratio She tolerated resumption of Pomalyst and daratumumab well She will continue aspirin for DVT prophylaxis and acyclovir for antimicrobial prophylaxis We have discontinue Zometa due to long-term use She will continue calcium with vitamin D and she will be due for another bone density scan November of this year  Other fatigue She has chronic fatigue Suspect it could be caused by her low blood pressure I recommend she start checking her heart rate and blood pressure while on metoprolol  Pelvic floor dysfunction in female She has significant difficulties controlling her bladder and bowel function secondary to pelvic floor dysfunction I recommend consideration to resume pelvic floor physical therapy She will think about it  GERD (gastroesophageal reflux disease) She has been taking high-dose pantoprazole for a long time Recommend consideration to reduce to once daily  Diarrhea She has chronic diarrhea, could be due to side effects of treatment I recommend Imodium   No orders of the defined types were placed in this encounter.   INTERVAL HISTORY: Please see below for problem oriented charting. The purpose of today's visit is to replace previous scheduled MD visit  prior to treatment tomorrow She is doing well from the multiple myeloma standpoint No new side effects She complained of other fatigue and chronic diarrhea which are stable She continues to have difficulties with difficulties controlling her bowel and bladder No recent infection, fever or chills  SUMMARY OF ONCOLOGIC HISTORY: Oncology History Overview Note  Multiple myeloma, Ig A Lambda, M spike 3.54 grams, Calcium 9.2, Creatinine 0.8, Beta 2 microglobulin 4.52, IgA 4840 mg/dL, lambda light chain 75.4, albumin 3.6, hemoglobin 9.7, platelet 115    Primary site: Multiple Myeloma   Staging method: AJCC 6th Edition   Clinical: Stage IIA signed by [name], MD on 11/07/2013  2:46 PM   Summary: Stage IIA      Multiple myeloma ([place])  10/31/2013 Bone Marrow Biopsy   Bone marrow biopsy confirmed multiple myeloma with 40% bone marrow involvement. Skeletal survey showed minimal lesions in her score with generalized demineralization   11/10/2013 - 02/13/2014 Chemotherapy   The patient is started on induction chemotherapy with weekly dexamethasone 40 mg by mouth as well as Velcade subcutaneous injection on days 1, 4, 8 and 11. On 11/21/2013, she was started on monthly Zometa.   12/23/2013 Adverse Reaction   The dose of Velcade was reduced due to thrombocytopenia.   01/28/2014 - 04/07/2014 Chemotherapy   Revlimid is added. Treatment was discontinued due to lack of response.   02/24/2014 - 04/07/2014 Chemotherapy   Due to worsening peripheral neuropathy, Velcade injection is changed to once a week. Revlimid was given 21 days on, 7 days off.   04/07/2014 - 04/10/2014 Chemotherapy   Revlimid was discontinued due to lack of response. Chemotherapy was changed back to Velcade injection twice a week, 2 weeks on 1 week off. Her treatment  was switched to to minimum response   04/20/2014 - 06/02/2014 Chemotherapy   chemotherapy is switched to Carfilzomib, Cytoxan and dexamethasone.   04/22/2014 Procedure    she has placement of port for chemotherapy.   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 at [facility]   10/20/2014 - 03/24/2015 Chemotherapy    she received chemotherapy with Kyprolis, Revlimid and dexamethasone   10/22/2014 Procedure   She has port placement   01/19/2015 Tumor Marker   IgA lambda M spike at 0.4 g    01/20/2015 Miscellaneous   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 No cource was found and fever resolved with IV vancomycin and meropenem   09/13/2015 Bone Marrow Biopsy   Bone marrow biopsy showed no increased blasts, 3-4 % plasma cells   03/02/2016 Bone Marrow Biopsy   Bone marrow biopsy at [facility] showed normocellular (30%) bone marrow with trilineage hematopoiesis. No significant increase in blasts. No significant increase in plasma cells.   05/12/2016 Imaging   DEXA scan at [facility] showed osteopenia   10/24/2016 Imaging   Skeletal survey at [facility], no new lesions   12/07/2017 Imaging   No focal abnormality noted to suggest myeloma. Exam is stable from prior exam.   03/01/2018 Procedure   Successful 8 French right internal jugular vein power port  placement with its tip at the SVC/RA junction.   03/06/2018 -  Chemotherapy   The patient had daratumumab    07/09/2018 Bone Marrow Biopsy   Bone marrow biopsy at [facility] showed residual disease at 0.004% plasma cells   07/03/2019 Bone Marrow Biopsy   A. Bone marrow, flow cytometric analysis for multiple myeloma minimal residual disease detection:   Negative. No phenotypically abnormal plasma cells at or above the limit of detection identified.     No monotypic B-cell population identified. Negative for increased blasts.   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   10/31/2015 Miscellaneous   She has engrafted     REVIEW OF SYSTEMS:   Constitutional: Denies fevers, chills or abnormal weight loss Eyes: Denies blurriness of vision Ears, nose, mouth, throat, and face: Denies mucositis or sore throat Respiratory: Denies  cough, dyspnea or wheezes Cardiovascular: Denies palpitation, chest discomfort Skin: Denies abnormal skin rashes Lymphatics: Denies new lymphadenopathy or easy bruising Neurological:Denies numbness, tingling or new weaknesses Behavioral/Psych: Mood is stable, no new changes  Extremities: No lower extremity edema All other systems were reviewed with the patient and are negative.  I have reviewed the past medical history, past surgical history, social history and family history with the  patient and they are unchanged from previous note.  ALLERGIES:  has No Known Allergies.  MEDICATIONS:  Current Outpatient Medications  Medication Sig Dispense Refill  . acyclovir (ZOVIRAX) 400 MG tablet TAKE 1 TABLET BY MOUTH TWICE A DAY 180 tablet 11  . aspirin 325 MG tablet Take 325 mg by mouth daily.    . calcipotriene (DOVONOX) 0.005 % cream daily.     . calcium carbonate (TUMS - DOSED IN MG ELEMENTAL CALCIUM) 500 MG chewable tablet Chew 1 tablet by mouth daily.    . Cholecalciferol 25 MCG (1000 UT) tablet Take 1,000 Units by mouth daily.    Marland Kitchen DARATUMUMAB IV Inject into the vein every 30 (thirty) days.    . diphenoxylate-atropine (LOMOTIL) 2.5-0.025 MG tablet Take 1 tablet by mouth 4 (four) times daily as needed for diarrhea or loose stools. 60 tablet 0  . loperamide (IMODIUM) 2 MG capsule Take by mouth as needed for diarrhea or loose stools. As needed only    . metoprolol tartrate (LOPRESSOR) 25 MG tablet TAKE 1.5 TABLETS (37.5 MG TOTAL) BY MOUTH 2 (TWO) TIMES DAILY. 270 tablet 0  . mirtazapine (REMERON) 15 MG tablet Take 1 tablet (15 mg total) by mouth at bedtime. 90 tablet 1  . Multiple Vitamin (MULTIVITAMIN WITH MINERALS) TABS tablet Take 1 tablet by mouth daily.    . ondansetron (ZOFRAN) 4 MG tablet Take 1 tablet (4 mg total) by mouth every 8 (eight) hours as needed for nausea. 30 tablet 1  . pantoprazole (PROTONIX) 40 MG tablet Take 1 tablet (40 mg total) by mouth daily. 180 tablet 2  . pomalidomide (POMALYST) 2 MG capsule Take 1 capsule (2 mg total) by mouth daily. 1 capsule daily on days 1-21, then 7 days off. Repeat every 28 days. 21 capsule 0   No current facility-administered medications for this visit.    PHYSICAL EXAMINATION: ECOG PERFORMANCE STATUS: 1 - Symptomatic but completely ambulatory  LABORATORY DATA:  I have reviewed the data as listed CMP Latest Ref Rng & Units 10/13/2020 08/16/2020 08/05/2020  Glucose 70 - 99 mg/dL 86 92 110(H)  BUN 8 - 23 mg/dL 17 25(H) 25(H)   Creatinine 0.44 - 1.00 mg/dL 0.74 0.83 0.87  Sodium 135 - 145 mmol/L 141 138 141  Potassium 3.5 - 5.1 mmol/L 4.0 3.7 3.6  Chloride 98 - 111 mmol/L 106 104 113(H)  CO2 22 - 32 mmol/L 26 26 18(L)  Calcium 8.9 - 10.3 mg/dL 9.1 9.1 8.8(L)  Total Protein 6.5 - 8.1 g/dL 6.3(L) 8.3(H) 6.6  Total Bilirubin 0.3 - 1.2 mg/dL <0.2(L) 1.1 0.4  Alkaline Phos 38 - 126 U/L 68 69 60  AST 15 - 41 U/L 13(L) 27 13(L)  ALT 0 - 44 U/L _0 Lab Results  Component Value Date   WBC 5.0 10/13/2020   HGB 12.9 10/13/2020   HCT 39.2 10/13/2020   MCV 108.3 (H) 10/13/2020   PLT 131 (L) 10/13/2020   NEUTROABS 1.5 (L) 10/13/2020    I discussed the assessment and treatment plan with the patient. The patient was  provided an opportunity to ask questions and all were answered. The patient agreed with the plan and demonstrated an understanding of the instructions. The patient was advised to call back or seek an in-person evaluation if the symptoms worsen or if the condition fails to improve as anticipated.    I spent 20 minutes for the appointment reviewing test results, discuss management and coordination of care.  [name], MD 11/09/2020 10:06 AM

## 2020-11-09 NOTE — Assessment & Plan Note (Signed)
She has chronic diarrhea, could be due to side effects of treatment I recommend Imodium

## 2020-11-09 NOTE — Assessment & Plan Note (Signed)
She has chronic fatigue Suspect it could be caused by her low blood pressure I recommend she start checking her heart rate and blood pressure while on metoprolol

## 2020-11-09 NOTE — Assessment & Plan Note (Signed)
Recent myeloma panel showed no detectable M spike and with normal light chain ratio She tolerated resumption of Pomalyst and daratumumab well She will continue aspirin for DVT prophylaxis and acyclovir for antimicrobial prophylaxis We have discontinue Zometa due to long-term use She will continue calcium with vitamin D and she will be due for another bone density scan November of this year 

## 2020-11-09 NOTE — Assessment & Plan Note (Signed)
She has significant difficulties controlling her bladder and bowel function secondary to pelvic floor dysfunction I recommend consideration to resume pelvic floor physical therapy She will think about it

## 2020-11-10 ENCOUNTER — Inpatient Hospital Stay: Payer: Medicare HMO | Admitting: Hematology and Oncology

## 2020-11-10 ENCOUNTER — Other Ambulatory Visit: Payer: Medicare HMO

## 2020-11-10 ENCOUNTER — Inpatient Hospital Stay: Payer: Medicare HMO

## 2020-11-10 ENCOUNTER — Telehealth: Payer: Self-pay | Admitting: Hematology and Oncology

## 2020-11-10 ENCOUNTER — Other Ambulatory Visit: Payer: Self-pay

## 2020-11-10 VITALS — BP 130/69 | HR 58 | Temp 98.6°F | Resp 16 | Wt 165.2 lb

## 2020-11-10 DIAGNOSIS — C9002 Multiple myeloma in relapse: Secondary | ICD-10-CM

## 2020-11-10 DIAGNOSIS — R197 Diarrhea, unspecified: Secondary | ICD-10-CM | POA: Diagnosis not present

## 2020-11-10 DIAGNOSIS — D469 Myelodysplastic syndrome, unspecified: Secondary | ICD-10-CM

## 2020-11-10 DIAGNOSIS — K219 Gastro-esophageal reflux disease without esophagitis: Secondary | ICD-10-CM | POA: Diagnosis not present

## 2020-11-10 DIAGNOSIS — Z5112 Encounter for antineoplastic immunotherapy: Secondary | ICD-10-CM | POA: Diagnosis present

## 2020-11-10 DIAGNOSIS — C9000 Multiple myeloma not having achieved remission: Secondary | ICD-10-CM

## 2020-11-10 DIAGNOSIS — Z7982 Long term (current) use of aspirin: Secondary | ICD-10-CM | POA: Diagnosis not present

## 2020-11-10 DIAGNOSIS — Z79899 Other long term (current) drug therapy: Secondary | ICD-10-CM | POA: Diagnosis not present

## 2020-11-10 LAB — COMPREHENSIVE METABOLIC PANEL
ALT: 10 U/L (ref 0–44)
AST: 13 U/L — ABNORMAL LOW (ref 15–41)
Albumin: 3.3 g/dL — ABNORMAL LOW (ref 3.5–5.0)
Alkaline Phosphatase: 70 U/L (ref 38–126)
Anion gap: 9 (ref 5–15)
BUN: 19 mg/dL (ref 8–23)
CO2: 28 mmol/L (ref 22–32)
Calcium: 8.9 mg/dL (ref 8.9–10.3)
Chloride: 104 mmol/L (ref 98–111)
Creatinine, Ser: 0.74 mg/dL (ref 0.44–1.00)
GFR, Estimated: >60 mL/min (ref >60)
Glucose, Bld: 86 mg/dL (ref 70–99)
Potassium: 4.2 mmol/L (ref 3.5–5.1)
Sodium: 141 mmol/L (ref 135–145)
Total Bilirubin: <0.2 mg/dL — ABNORMAL LOW (ref 0.3–1.2)
Total Protein: 6.2 g/dL — ABNORMAL LOW (ref 6.5–8.1)

## 2020-11-10 LAB — CBC WITH DIFFERENTIAL/PLATELET
Abs Immature Granulocytes: 0 10*3/uL (ref 0.00–0.07)
Basophils Absolute: 0 10*3/uL (ref 0.0–0.1)
Basophils Relative: 1 %
Eosinophils Absolute: 0 10*3/uL (ref 0.0–0.5)
Eosinophils Relative: 0 %
HCT: 39 % (ref 36.0–46.0)
Hemoglobin: 13.2 g/dL (ref 12.0–15.0)
Immature Granulocytes: 0 %
Lymphocytes Relative: 57 %
Lymphs Abs: 2.5 10*3/uL (ref 0.7–4.0)
MCH: 35.1 pg — ABNORMAL HIGH (ref 26.0–34.0)
MCHC: 33.8 g/dL (ref 30.0–36.0)
MCV: 103.7 fL — ABNORMAL HIGH (ref 80.0–100.0)
Monocytes Absolute: 0.7 10*3/uL (ref 0.1–1.0)
Monocytes Relative: 16 %
Neutro Abs: 1.1 10*3/uL — ABNORMAL LOW (ref 1.7–7.7)
Neutrophils Relative %: 26 %
Platelets: 157 10*3/uL (ref 150–400)
RBC: 3.76 MIL/uL — ABNORMAL LOW (ref 3.87–5.11)
RDW: 12.2 % (ref 11.5–15.5)
WBC: 4.3 10*3/uL (ref 4.0–10.5)
nRBC: 0 % (ref 0.0–0.2)

## 2020-11-10 MED ORDER — HEPARIN SOD (PORK) LOCK FLUSH 100 UNIT/ML IV SOLN
500.0000 [IU] | Freq: Once | INTRAVENOUS | Status: AC | PRN
  Administered 2020-11-10: 500 [IU]
  Filled 2020-11-10: qty 5

## 2020-11-10 MED ORDER — ACETAMINOPHEN 325 MG PO TABS
ORAL_TABLET | ORAL | Status: AC
  Filled 2020-11-10: qty 2

## 2020-11-10 MED ORDER — FAMOTIDINE 20 MG IN NS 100 ML IVPB
20.0000 mg | Freq: Once | INTRAVENOUS | Status: AC
  Administered 2020-11-10: 20 mg via INTRAVENOUS

## 2020-11-10 MED ORDER — FAMOTIDINE 20 MG IN NS 100 ML IVPB
INTRAVENOUS | Status: AC
  Filled 2020-11-10: qty 100

## 2020-11-10 MED ORDER — SODIUM CHLORIDE 0.9% FLUSH
10.0000 mL | INTRAVENOUS | Status: DC | PRN
  Administered 2020-11-10: 10 mL
  Filled 2020-11-10: qty 10

## 2020-11-10 MED ORDER — SODIUM CHLORIDE 0.9 % IV SOLN
Freq: Once | INTRAVENOUS | Status: AC
Start: 2020-11-10 — End: 2020-11-10
  Filled 2020-11-10: qty 250

## 2020-11-10 MED ORDER — SODIUM CHLORIDE 0.9 % IV SOLN
16.0000 mg/kg | Freq: Once | INTRAVENOUS | Status: AC
  Administered 2020-11-10: 1200 mg via INTRAVENOUS
  Filled 2020-11-10: qty 60

## 2020-11-10 MED ORDER — SODIUM CHLORIDE 0.9% FLUSH
10.0000 mL | Freq: Once | INTRAVENOUS | Status: AC
  Administered 2020-11-10: 10 mL
  Filled 2020-11-10: qty 10

## 2020-11-10 MED ORDER — ACETAMINOPHEN 325 MG PO TABS
650.0000 mg | ORAL_TABLET | Freq: Once | ORAL | Status: AC
Start: 2020-11-10 — End: 2020-11-10
  Administered 2020-11-10: 650 mg via ORAL

## 2020-11-10 NOTE — Telephone Encounter (Signed)
Scheduled patient per 05/17 schedule message. Patient is aware.

## 2020-11-10 NOTE — Patient Instructions (Signed)
[facility] MEDICAL [facility]COLOG[facility]  Discharge Instructions: Thank you for choosing [facility] to provide your oncology and hematology care.   If you have a lab appointment with the Cancer Center, please go directly to the Cancer Center and check in at the registration area.   Wear comfortable clothing and clothing appropriate for easy access to any Portacath or PICC line.   We strive to give you quality time with your provider. You may need to reschedule your appointment if you arrive late (15 or more minutes).  Arriving late affects you and other patients whose appointments are after yours.  Also, if you miss three or more appointments without notifying the office, you may be dismissed from the clinic at the provider's discretion.      For prescription refill requests, have your pharmacy contact our office and allow 72 hours for refills to be completed.    Today you received the following chemotherapy and/or immunotherapy agents darzalex      To help prevent nausea and vomiting after your treatment, we encourage you to take your nausea medication as directed.  BELOW ARE SYMPTOMS THAT SHOULD BE REPORTED IMMEDIATELY: *FEVER GREATER THAN 100.4 F (38 C) OR HIGHER *CHILLS OR SWEATING *NAUSEA AND VOMITING THAT IS NOT CONTROLLED WITH YOUR NAUSEA MEDICATION *UNUSUAL SHORTNESS OF BREATH *UNUSUAL BRUISING OR BLEEDING *URINARY PROBLEMS (pain or burning when urinating, or frequent urination) *BOWEL PROBLEMS (unusual diarrhea, constipation, pain near the anus) TENDERNESS IN MOUTH AND THROAT WITH OR WITHOUT PRESENCE OF ULCERS (sore throat, sores in mouth, or a toothache) UNUSUAL RASH, SWELLING OR PAIN  UNUSUAL VAGINAL DISCHARGE OR ITCHING   Items with * indicate a potential emergency and should be followed up as soon as possible or go to the Emergency Department if any problems should occur.  Please show the CHEMOTHERAPY ALERT CARD or IMMUNOTHERAPY ALERT CARD at check-in to  the Emergency Department and triage nurse.  Should you have questions after your visit or need to cancel or reschedule your appointment, please contact [facility]  Dept: [phone]  and follow the prompts.  Office hours are 8:00 a.m. to 4:30 p.m. Monday - Friday. Please note that voicemails left after 4:00 p.m. may not be returned until the following business day.  We are closed weekends and major holidays. You have access to a nurse at all times for urgent questions. Please call the main number to the clinic Dept: [phone] and follow the prompts.   For any non-urgent questions, you may also contact your provider using MyChart. We now offer e-Visits for anyone 18 and older to request care online for non-urgent symptoms. For details visit [email or web address].   Also download the MyChart app! Go to the app store, search "MyChart", open the app, select [facility], and log in with your MyChart username and password.  Due to Covid, a mask is required upon entering the hospital/clinic. If you do not have a mask, one will be given to you upon arrival. For doctor visits, patients may have 1 support person aged 18 or older with them. For treatment visits, patients cannot have anyone with them due to current Covid guidelines and our immunocompromised population.  

## 2020-11-10 NOTE — Progress Notes (Signed)
Per Dr Alvy Bimler, [place] to proceed w/ tx today despite [place]. [place], RN made aware.

## 2020-11-11 ENCOUNTER — Other Ambulatory Visit: Payer: Self-pay | Admitting: Hematology and Oncology

## 2020-11-11 LAB — KAPPA/LAMBDA LIGHT CHAINS
Kappa free light chain: 20.1 mg/L — ABNORMAL HIGH (ref 3.3–19.4)
Kappa, lambda light chain ratio: 0.5 (ref 0.26–1.65)
Lambda free light chains: 40.6 mg/L — ABNORMAL HIGH (ref 5.7–26.3)

## 2020-11-11 NOTE — Telephone Encounter (Signed)
Pls refill electronically °

## 2020-11-15 LAB — MULTIPLE MYELOMA PANEL, SERUM
Albumin SerPl Elph-Mcnc: 3.3 g/dL (ref 2.9–4.4)
Albumin/Glob SerPl: 1.6 (ref 0.7–1.7)
Alpha 1: 0.2 g/dL (ref 0.0–0.4)
Alpha2 Glob SerPl Elph-Mcnc: 0.8 g/dL (ref 0.4–1.0)
B-Globulin SerPl Elph-Mcnc: 0.7 g/dL (ref 0.7–1.3)
Gamma Glob SerPl Elph-Mcnc: 0.6 g/dL (ref 0.4–1.8)
Globulin, Total: 2.2 g/dL (ref 2.2–3.9)
IgA: 254 mg/dL (ref 64–422)
IgG (Immunoglobin G), Serum: 524 mg/dL — ABNORMAL LOW (ref 586–1602)
IgM (Immunoglobulin M), Srm: 49 mg/dL (ref 26–217)
M Protein SerPl Elph-Mcnc: 0.2 g/dL — ABNORMAL HIGH
Total Protein ELP: 5.5 g/dL — ABNORMAL LOW (ref 6.0–8.5)

## 2020-11-16 ENCOUNTER — Telehealth: Payer: Self-pay

## 2020-11-16 NOTE — Telephone Encounter (Signed)
-----   Message from [name], MD sent at 11/16/2020 10:07 AM EDT ----- Pls let her know M protein is stable Continue Rx

## 2020-11-16 NOTE — Telephone Encounter (Signed)
Called and given below message. She verbalzed understanding.  She saw PCP Friday for fever and congestion. Covid test negative. Denies fever and feeling better. Still having congestion. Taking antibiotic and Claritin. Just FYI.

## 2020-11-18 ENCOUNTER — Telehealth: Payer: Self-pay | Admitting: Internal Medicine

## 2020-11-18 NOTE — Telephone Encounter (Signed)
Patient had an episode yesterday of chest pain with her meals.  She is feeling better and tolerating a diet today.  She will remain on a bland diet for the next 24 hours, take her pantoprazole daily.  Patient instructed to maintain an anti-reflux diet. Advised to avoid caffeine, mint, citrus foods/juices, tomatoes,  chocolate, NSAIDS/ASA products.  Instructed not to eat within 2 hours of exercise or bed, multiple small meals are better than 3 large meals.  Need to take PPI 30 minutes prior to 1st meal of the day.  She will call back for any additional questions or concerns.

## 2020-11-18 NOTE — Telephone Encounter (Signed)
Inbound call from patient. States she is having pain in her esophagus after eat believes it is due to hiatal hernia. Would like to talk to a nurse more about what could be down. Best contact number [phone]

## 2020-12-07 ENCOUNTER — Inpatient Hospital Stay: Payer: Medicare HMO | Admitting: Hematology and Oncology

## 2020-12-07 ENCOUNTER — Other Ambulatory Visit: Payer: Self-pay

## 2020-12-07 ENCOUNTER — Inpatient Hospital Stay: Payer: Medicare HMO | Attending: Hematology and Oncology

## 2020-12-07 ENCOUNTER — Other Ambulatory Visit: Payer: Self-pay | Admitting: Hematology and Oncology

## 2020-12-07 ENCOUNTER — Encounter: Payer: Self-pay | Admitting: Hematology and Oncology

## 2020-12-07 ENCOUNTER — Inpatient Hospital Stay: Payer: Medicare HMO

## 2020-12-07 VITALS — BP 121/55 | HR 51 | Temp 98.6°F | Resp 18

## 2020-12-07 DIAGNOSIS — Z79899 Other long term (current) drug therapy: Secondary | ICD-10-CM | POA: Diagnosis not present

## 2020-12-07 DIAGNOSIS — R5381 Other malaise: Secondary | ICD-10-CM | POA: Diagnosis not present

## 2020-12-07 DIAGNOSIS — C9000 Multiple myeloma not having achieved remission: Secondary | ICD-10-CM

## 2020-12-07 DIAGNOSIS — K2100 Gastro-esophageal reflux disease with esophagitis, without bleeding: Secondary | ICD-10-CM | POA: Diagnosis not present

## 2020-12-07 DIAGNOSIS — C9002 Multiple myeloma in relapse: Secondary | ICD-10-CM | POA: Diagnosis present

## 2020-12-07 DIAGNOSIS — D469 Myelodysplastic syndrome, unspecified: Secondary | ICD-10-CM

## 2020-12-07 DIAGNOSIS — M8580 Other specified disorders of bone density and structure, unspecified site: Secondary | ICD-10-CM | POA: Insufficient documentation

## 2020-12-07 DIAGNOSIS — D696 Thrombocytopenia, unspecified: Secondary | ICD-10-CM | POA: Diagnosis not present

## 2020-12-07 DIAGNOSIS — G629 Polyneuropathy, unspecified: Secondary | ICD-10-CM | POA: Insufficient documentation

## 2020-12-07 DIAGNOSIS — Z5112 Encounter for antineoplastic immunotherapy: Secondary | ICD-10-CM | POA: Diagnosis present

## 2020-12-07 DIAGNOSIS — Z7982 Long term (current) use of aspirin: Secondary | ICD-10-CM | POA: Insufficient documentation

## 2020-12-07 LAB — COMPREHENSIVE METABOLIC PANEL
ALT: 8 U/L (ref 0–44)
AST: 10 U/L — ABNORMAL LOW (ref 15–41)
Albumin: 3.3 g/dL — ABNORMAL LOW (ref 3.5–5.0)
Alkaline Phosphatase: 75 U/L (ref 38–126)
Anion gap: 8 (ref 5–15)
BUN: 14 mg/dL (ref 8–23)
CO2: 27 mmol/L (ref 22–32)
Calcium: 9 mg/dL (ref 8.9–10.3)
Chloride: 106 mmol/L (ref 98–111)
Creatinine, Ser: 0.72 mg/dL (ref 0.44–1.00)
GFR, Estimated: >60 mL/min (ref >60)
Glucose, Bld: 84 mg/dL (ref 70–99)
Potassium: 4 mmol/L (ref 3.5–5.1)
Sodium: 141 mmol/L (ref 135–145)
Total Bilirubin: 0.3 mg/dL (ref 0.3–1.2)
Total Protein: 6 g/dL — ABNORMAL LOW (ref 6.5–8.1)

## 2020-12-07 LAB — CBC WITH DIFFERENTIAL/PLATELET
Abs Immature Granulocytes: 0 10*3/uL (ref 0.00–0.07)
Basophils Absolute: 0 10*3/uL (ref 0.0–0.1)
Basophils Relative: 1 %
Eosinophils Absolute: 0 10*3/uL (ref 0.0–0.5)
Eosinophils Relative: 0 %
HCT: 36.1 % (ref 36.0–46.0)
Hemoglobin: 12.3 g/dL (ref 12.0–15.0)
Immature Granulocytes: 0 %
Lymphocytes Relative: 55 %
Lymphs Abs: 2.4 10*3/uL (ref 0.7–4.0)
MCH: 34.6 pg — ABNORMAL HIGH (ref 26.0–34.0)
MCHC: 34.1 g/dL (ref 30.0–36.0)
MCV: 101.4 fL — ABNORMAL HIGH (ref 80.0–100.0)
Monocytes Absolute: 0.7 10*3/uL (ref 0.1–1.0)
Monocytes Relative: 18 %
Neutro Abs: 1.1 10*3/uL — ABNORMAL LOW (ref 1.7–7.7)
Neutrophils Relative %: 26 %
Platelets: 166 10*3/uL (ref 150–400)
RBC: 3.56 MIL/uL — ABNORMAL LOW (ref 3.87–5.11)
RDW: 13.2 % (ref 11.5–15.5)
WBC: 4.2 10*3/uL (ref 4.0–10.5)
nRBC: 0 % (ref 0.0–0.2)

## 2020-12-07 MED ORDER — HEPARIN SOD (PORK) LOCK FLUSH 100 UNIT/ML IV SOLN
500.0000 [IU] | Freq: Once | INTRAVENOUS | Status: AC | PRN
  Administered 2020-12-07: 500 [IU]
  Filled 2020-12-07: qty 5

## 2020-12-07 MED ORDER — SODIUM CHLORIDE 0.9% FLUSH
10.0000 mL | Freq: Once | INTRAVENOUS | Status: AC
  Administered 2020-12-07: 10 mL
  Filled 2020-12-07: qty 10

## 2020-12-07 MED ORDER — FAMOTIDINE 20 MG IN NS 100 ML IVPB
20.0000 mg | Freq: Once | INTRAVENOUS | Status: AC
  Administered 2020-12-07: 20 mg via INTRAVENOUS

## 2020-12-07 MED ORDER — SODIUM CHLORIDE 0.9 % IV SOLN
Freq: Once | INTRAVENOUS | Status: AC
  Filled 2020-12-07: qty 250

## 2020-12-07 MED ORDER — FAMOTIDINE 20 MG IN NS 100 ML IVPB
INTRAVENOUS | Status: AC
  Filled 2020-12-07: qty 100

## 2020-12-07 MED ORDER — ACETAMINOPHEN 325 MG PO TABS
650.0000 mg | ORAL_TABLET | Freq: Once | ORAL | Status: AC
  Administered 2020-12-07: 650 mg via ORAL

## 2020-12-07 MED ORDER — ACETAMINOPHEN 325 MG PO TABS
ORAL_TABLET | ORAL | Status: AC
  Filled 2020-12-07: qty 2

## 2020-12-07 MED ORDER — SODIUM CHLORIDE 0.9 % IV SOLN
16.0000 mg/kg | Freq: Once | INTRAVENOUS | Status: AC
  Administered 2020-12-07: 1200 mg via INTRAVENOUS
  Filled 2020-12-07: qty 60

## 2020-12-07 MED ORDER — SODIUM CHLORIDE 0.9% FLUSH
10.0000 mL | INTRAVENOUS | Status: DC | PRN
  Administered 2020-12-07: 10 mL
  Filled 2020-12-07: qty 10

## 2020-12-07 NOTE — Assessment & Plan Note (Signed)
She has generalized physical deconditioning I encouraged her to rejoin the gym and start exercising again

## 2020-12-07 NOTE — Progress Notes (Signed)
[facility] OFFICE PROGRESS NOTE  Patient Care Team: [name], [name], MD as PCP - General (Internal Medicine) [name], MD as PCP - Cardiology (Cardiology) [name], MD as Referring Physician (Hematology and Oncology) [name], MD as Consulting Physician (Internal Medicine) [name], [name], MD as Consulting Physician (Infectious Diseases) [name], MD as Consulting Physician (Hematology and Oncology) [name], NP as Nurse Practitioner (Hematology and Oncology)  ASSESSMENT & PLAN:  Multiple myeloma [facility]) She tolerated resumption of Pomalyst and daratumumab well She will continue aspirin for DVT prophylaxis and acyclovir for antimicrobial prophylaxis We have discontinue Zometa due to long-term use She will continue calcium with vitamin D and she will be due for another bone density scan November of this year  GERD (gastroesophageal reflux disease) She had recent significant heartburn She is known to have hiatal hernia We discussed importance of exercise and weight loss   Physical deconditioning She has generalized physical deconditioning I encouraged her to rejoin the gym and start exercising again  No orders of the defined types were placed in this encounter.   All questions were answered. The patient knows to call the clinic with any problems, questions or concerns. The total time spent in the appointment was 20 minutes encounter with patients including review of chart and various tests results, discussions about plan of care and coordination of care plan   [name], MD 12/07/2020 1:10 PM  INTERVAL HISTORY: Please see below for problem oriented charting. She returns for further follow-up She had low-grade fever recently and was prescribed antibiotics but did not seem to help too much with her symptoms She tested negative for COVID-19 She continues to have problems with urinary difficulties She  had recent severe heartburn She has not been exercising lately Overall, denies recent side effects from chemotherapy  SUMMARY OF ONCOLOGIC HISTORY: Oncology History Overview Note  Multiple myeloma, Ig A Lambda, M spike 3.54 grams, Calcium 9.2, Creatinine 0.8, Beta 2 microglobulin 4.52, IgA 4840 mg/dL, lambda light chain 75.4, albumin 3.6, hemoglobin 9.7, platelet 115    Primary site: Multiple Myeloma   Staging method: AJCC 6th Edition   Clinical: Stage IIA signed by [name], MD on 11/07/2013  2:46 PM   Summary: Stage IIA      Multiple myeloma ([place])  10/31/2013 Bone Marrow Biopsy   Bone marrow biopsy confirmed multiple myeloma with 40% bone marrow involvement. Skeletal survey showed minimal lesions in her score with generalized demineralization    11/10/2013 - 02/13/2014 Chemotherapy   The patient is started on induction chemotherapy with weekly dexamethasone 40 mg by mouth as well as Velcade subcutaneous injection on days 1, 4, 8 and 11. On 11/21/2013, she was started on monthly Zometa.    12/23/2013 Adverse Reaction   The dose of Velcade was reduced due to thrombocytopenia.    01/28/2014 - 04/07/2014 Chemotherapy   Revlimid is added. Treatment was discontinued due to lack of response.    02/24/2014 - 04/07/2014 Chemotherapy   Due to worsening peripheral neuropathy, Velcade injection is changed to once a week. Revlimid was given 21 days on, 7 days off.    04/07/2014 - 04/10/2014 Chemotherapy   Revlimid was discontinued due to lack of response. Chemotherapy was changed back to Velcade injection twice a week, 2 weeks on 1 week off. Her treatment was switched to to minimum response    04/20/2014 - 06/02/2014 Chemotherapy   chemotherapy is switched to Carfilzomib, Cytoxan and dexamethasone.  04/22/2014 Procedure   she has placement of port for chemotherapy.    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 which showed mild pancytopenia but not diagnostic for myelodysplastic syndrome or multiple myeloma    07/22/2015 - 09/21/2015 Chemotherapy   She is receiving Daratumumab at [facility] due to relapsed myeloma    08/03/2015 - 08/06/2015 Hospital Admission   She was admitted to the hospital for neutropenic fever. No cource was found and fever resolved with IV vancomycin and meropenem    09/13/2015 Bone Marrow Biopsy   Bone marrow biopsy showed no increased blasts, 3-4 % plasma cells    03/02/2016 Bone Marrow Biopsy   Bone marrow biopsy at [facility] showed normocellular (30%) bone marrow with trilineage hematopoiesis. No significant increase in blasts. No significant increase in plasma cells.    05/12/2016 Imaging   DEXA scan at [facility] showed osteopenia    10/24/2016 Imaging   Skeletal survey at [facility], no new lesions    12/07/2017 Imaging   No focal abnormality noted to suggest myeloma. Exam is stable from prior exam.    03/01/2018 Procedure   Successful 8 French right internal jugular vein power port placement with its tip at the SVC/RA junction.    03/06/2018 -  Chemotherapy   The patient had daratumumab     07/09/2018 Bone  Marrow Biopsy   Bone marrow biopsy at [facility] showed residual disease at 0.004% plasma cells    07/03/2019 Bone Marrow Biopsy   A. Bone marrow, flow cytometric analysis for multiple myeloma minimal residual disease detection:   Negative. No phenotypically abnormal plasma cells at or above the limit of detection identified.     No monotypic B-cell population identified. Negative for increased blasts.   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    10/31/2015 Miscellaneous   She has engrafted      REVIEW OF SYSTEMS:   Constitutional: Denies fevers, chills or abnormal weight loss Eyes: Denies blurriness of vision Ears, nose, mouth, throat, and face: Denies mucositis or  sore throat Respiratory: Denies cough, dyspnea or wheezes Cardiovascular: Denies palpitation, chest discomfort or lower extremity swelling Skin: Denies abnormal skin rashes Lymphatics: Denies new lymphadenopathy or easy bruising Neurological:Denies numbness, tingling or new weaknesses Behavioral/Psych: Mood is stable, no new changes  All other systems were reviewed with the patient and are negative.  I have reviewed the past medical history, past surgical history, social history and family history with the patient and they are unchanged from previous note.  ALLERGIES:  has No Known Allergies.  MEDICATIONS:  Current  Outpatient Medications  Medication Sig Dispense Refill   acyclovir (ZOVIRAX) 400 MG tablet TAKE 1 TABLET BY MOUTH TWICE A DAY 180 tablet 11   aspirin 325 MG tablet Take 325 mg by mouth daily.     calcipotriene (DOVONOX) 0.005 % cream daily.      calcium carbonate (TUMS - DOSED IN MG ELEMENTAL CALCIUM) 500 MG chewable tablet Chew 1 tablet by mouth daily.     Cholecalciferol 25 MCG (1000 UT) tablet Take 1,000 Units by mouth daily.     DARATUMUMAB IV Inject into the vein every 30 (thirty) days.     diphenoxylate-atropine (LOMOTIL) 2.5-0.025 MG tablet Take 1 tablet by mouth 4 (four) times daily as needed for diarrhea or loose stools. 60 tablet 0   loperamide (IMODIUM) 2 MG capsule Take by mouth as needed for diarrhea or loose stools. As needed only     metoprolol tartrate (LOPRESSOR) 25 MG tablet TAKE 1.5 TABLETS (37.5 MG TOTAL) BY MOUTH 2 (TWO) TIMES DAILY. 270 tablet 0   mirtazapine (REMERON) 15 MG tablet Take 1 tablet (15 mg total) by mouth at bedtime. 90 tablet 1   Multiple Vitamin (MULTIVITAMIN WITH MINERALS) TABS tablet Take 1 tablet by mouth daily.     ondansetron (ZOFRAN) 4 MG tablet Take 1 tablet (4 mg total) by mouth every 8 (eight) hours as needed for nausea. 30 tablet 1   pantoprazole (PROTONIX) 40 MG tablet Take 1 tablet (40 mg total) by mouth daily. 180 tablet 2   POMALYST 2 MG capsule TAKE 1 CAPSULE DAILY ON DAYS 1 THROUGH 21, THEN 7 DAYS OFF EVERY 28 DAYS 21 capsule 0   No current facility-administered medications for this visit.   Facility-Administered Medications Ordered in Other Visits  Medication Dose Route Frequency Provider Last Rate Last Admin   sodium chloride flush (NS) 0.9 % injection 10 mL  10 mL Intracatheter PRN Alvy Bimler, Jahki Witham, MD   10 mL at 12/07/20 1255    PHYSICAL EXAMINATION: ECOG PERFORMANCE STATUS: 1 - Symptomatic but completely ambulatory  Vitals:   12/07/20 0902  BP: 132/67  Pulse: (!) 58  Resp: 18  Temp: 98.1 F (36.7 C)  SpO2: 100%   Filed Weights    12/07/20 0902  Weight: 164 lb 3.2 oz (74.5 kg)    GENERAL:alert, no distress and comfortable SKIN: skin color, texture, turgor are normal, no rashes or significant lesions EYES: normal, Conjunctiva are pink and non-injected, sclera clear OROPHARYNX:no exudate, no erythema and lips, buccal mucosa, and tongue normal  NECK: supple, thyroid normal size, non-tender, without nodularity LYMPH:  no palpable lymphadenopathy in the cervical, axillary or inguinal LUNGS: clear to auscultation and percussion with normal breathing effort HEART: regular rate & rhythm and no murmurs and no lower extremity edema ABDOMEN:abdomen soft, non-tender and normal bowel sounds Musculoskeletal:no cyanosis of digits and no clubbing  NEURO: alert & oriented x 3 with fluent speech, no focal motor/sensory deficits  LABORATORY DATA:  I have reviewed  the data as listed    Component Value Date/Time   NA 141 12/07/2020 0845   NA 142 01/04/2017 0902   K 4.0 12/07/2020 0845   K 3.9 01/04/2017 0902   CL 106 12/07/2020 0845   CO2 27 12/07/2020 0845   CO2 28 01/04/2017 0902   GLUCOSE 84 12/07/2020 0845   GLUCOSE 92 01/04/2017 0902   BUN 14 12/07/2020 0845   BUN 9.5 01/04/2017 0902   CREATININE 0.72 12/07/2020 0845   CREATININE 0.80 12/18/2018 0901   CREATININE 0.7 01/04/2017 0902   CALCIUM 9.0 12/07/2020 0845   CALCIUM 9.0 01/04/2017 0902   PROT 6.0 (L) 12/07/2020 0845   PROT 6.0 (L) 01/04/2017 0902   PROT 5.8 (L) 01/04/2017 0902   ALBUMIN 3.3 (L) 12/07/2020 0845   ALBUMIN 3.4 (L) 01/04/2017 0902   AST 10 (L) 12/07/2020 0845   AST 18 12/18/2018 0901   AST 21 01/04/2017 0902   ALT 8 12/07/2020 0845   ALT 14 12/18/2018 0901   ALT 16 01/04/2017 0902   ALKPHOS 75 12/07/2020 0845   ALKPHOS 101 01/04/2017 0902   BILITOT 0.3 12/07/2020 0845   BILITOT 0.3 12/18/2018 0901   BILITOT 0.56 01/04/2017 0902   GFRNONAA >60 12/07/2020 0845   GFRNONAA >60 12/18/2018 0901   GFRAA >60 03/25/2020 0917   GFRAA >60  12/18/2018 0901    No results found for: SPEP, UPEP  Lab Results  Component Value Date   WBC 4.2 12/07/2020   NEUTROABS 1.1 (L) 12/07/2020   HGB 12.3 12/07/2020   HCT 36.1 12/07/2020   MCV 101.4 (H) 12/07/2020   PLT 166 12/07/2020      Chemistry      Component Value Date/Time   NA 141 12/07/2020 0845   NA 142 01/04/2017 0902   K 4.0 12/07/2020 0845   K 3.9 01/04/2017 0902   CL 106 12/07/2020 0845   CO2 27 12/07/2020 0845   CO2 28 01/04/2017 0902   BUN 14 12/07/2020 0845   BUN 9.5 01/04/2017 0902   CREATININE 0.72 12/07/2020 0845   CREATININE 0.80 12/18/2018 0901   CREATININE 0.7 01/04/2017 0902      Component Value Date/Time   CALCIUM 9.0 12/07/2020 0845   CALCIUM 9.0 01/04/2017 0902   ALKPHOS 75 12/07/2020 0845   ALKPHOS 101 01/04/2017 0902   AST 10 (L) 12/07/2020 0845   AST 18 12/18/2018 0901   AST 21 01/04/2017 0902   ALT 8 12/07/2020 0845   ALT 14 12/18/2018 0901   ALT 16 01/04/2017 0902   BILITOT 0.3 12/07/2020 0845   BILITOT 0.3 12/18/2018 0901   BILITOT 0.56 01/04/2017 0902

## 2020-12-07 NOTE — Patient Instructions (Signed)
[facility] MEDICAL [facility]COLOGY   Discharge Instructions: Thank you for choosing [facility] to provide your oncology and hematology care.   If you have a lab appointment with the Cancer Center, please go directly to the Cancer Center and check in at the registration area.   Wear comfortable clothing and clothing appropriate for easy access to any Portacath or PICC line.   We strive to give you quality time with your provider. You may need to reschedule your appointment if you arrive late (15 or more minutes).  Arriving late affects you and other patients whose appointments are after yours.  Also, if you miss three or more appointments without notifying the office, you may be dismissed from the clinic at the provider's discretion.      For prescription refill requests, have your pharmacy contact our office and allow 72 hours for refills to be completed.    Today you received the following chemotherapy and/or immunotherapy agents: daratumumab      To help prevent nausea and vomiting after your treatment, we encourage you to take your nausea medication as directed.  BELOW ARE SYMPTOMS THAT SHOULD BE REPORTED IMMEDIATELY: *FEVER GREATER THAN 100.4 F (38 C) OR HIGHER *CHILLS OR SWEATING *NAUSEA AND VOMITING THAT IS NOT CONTROLLED WITH YOUR NAUSEA MEDICATION *UNUSUAL SHORTNESS OF BREATH *UNUSUAL BRUISING OR BLEEDING *URINARY PROBLEMS (pain or burning when urinating, or frequent urination) *BOWEL PROBLEMS (unusual diarrhea, constipation, pain near the anus) TENDERNESS IN MOUTH AND THROAT WITH OR WITHOUT PRESENCE OF ULCERS (sore throat, sores in mouth, or a toothache) UNUSUAL RASH, SWELLING OR PAIN  UNUSUAL VAGINAL DISCHARGE OR ITCHING   Items with * indicate a potential emergency and should be followed up as soon as possible or go to the Emergency Department if any problems should occur.  Please show the CHEMOTHERAPY ALERT CARD or IMMUNOTHERAPY ALERT CARD at check-in  to the Emergency Department and triage nurse.  Should you have questions after your visit or need to cancel or reschedule your appointment, please contact [facility]ICAL [facility]COL[facility]  Dept: [phone]  and follow the prompts.  Office hours are 8:00 a.m. to 4:30 p.m. Monday - Friday. Please note that voicemails left after 4:00 p.m. may not be returned until the following business day.  We are closed weekends and major holidays. You have access to a nurse at all times for urgent questions. Please call the main number to the clinic Dept: [phone] and follow the prompts.   For any non-urgent questions, you may also contact your provider using MyChart. We now offer e-Visits for anyone 18 and older to request care online for non-urgent symptoms. For details visit [email or web address].   Also download the MyChart app! Go to the app store, search "MyChart", open the app, select [facility], and log in with your MyChart username and password.  Due to Covid, a mask is required upon entering the hospital/clinic. If you do not have a mask, one will be given to you upon arrival. For doctor visits, patients may have 1 support person aged 18 or older with them. For treatment visits, patients cannot have anyone with them due to current Covid guidelines and our immunocompromised population.   

## 2020-12-07 NOTE — Assessment & Plan Note (Signed)
She tolerated resumption of Pomalyst and daratumumab well She will continue aspirin for DVT prophylaxis and acyclovir for antimicrobial prophylaxis We have discontinue Zometa due to long-term use She will continue calcium with vitamin D and she will be due for another bone density scan November of this year

## 2020-12-07 NOTE — Assessment & Plan Note (Addendum)
She had recent significant heartburn She is known to have hiatal hernia We discussed importance of exercise and weight loss

## 2020-12-07 NOTE — Telephone Encounter (Signed)
Pls refill electronically °

## 2020-12-08 LAB — KAPPA/LAMBDA LIGHT CHAINS
Kappa free light chain: 18.6 mg/L (ref 3.3–19.4)
Kappa, lambda light chain ratio: 0.37 (ref 0.26–1.65)
Lambda free light chains: 50.4 mg/L — ABNORMAL HIGH (ref 5.7–26.3)

## 2020-12-09 LAB — MULTIPLE MYELOMA PANEL, SERUM
Albumin SerPl Elph-Mcnc: 3.4 g/dL (ref 2.9–4.4)
Albumin/Glob SerPl: 1.4 (ref 0.7–1.7)
Alpha 1: 0.2 g/dL (ref 0.0–0.4)
Alpha2 Glob SerPl Elph-Mcnc: 0.9 g/dL (ref 0.4–1.0)
B-Globulin SerPl Elph-Mcnc: 0.8 g/dL (ref 0.7–1.3)
Gamma Glob SerPl Elph-Mcnc: 0.6 g/dL (ref 0.4–1.8)
Globulin, Total: 2.5 g/dL (ref 2.2–3.9)
IgA: 254 mg/dL (ref 64–422)
IgG (Immunoglobin G), Serum: 413 mg/dL — ABNORMAL LOW (ref 586–1602)
IgM (Immunoglobulin M), Srm: 41 mg/dL (ref 26–217)
M Protein SerPl Elph-Mcnc: 0.1 g/dL — ABNORMAL HIGH
Total Protein ELP: 5.9 g/dL — ABNORMAL LOW (ref 6.0–8.5)

## 2020-12-10 ENCOUNTER — Telehealth: Payer: Self-pay

## 2020-12-10 NOTE — Telephone Encounter (Signed)
She called and left a message. She fell at home and has fracture in her right radius. She is now wearing a splint/brace and will be following up with ortho.  She was trying to send a [employer]. Dr. Alvy Bimler is no longer listed as a provider. Added Dr. Alvy Bimler to provider list.

## 2020-12-13 ENCOUNTER — Encounter: Payer: Self-pay | Admitting: Hematology and Oncology

## 2020-12-23 ENCOUNTER — Other Ambulatory Visit: Payer: Self-pay

## 2020-12-23 MED ORDER — POMALIDOMIDE 2 MG PO CAPS: ORAL_CAPSULE | ORAL | 0 refills | Status: DC

## 2020-12-24 ENCOUNTER — Encounter: Payer: Self-pay | Admitting: Hematology and Oncology

## 2020-12-30 ENCOUNTER — Other Ambulatory Visit: Payer: Self-pay | Admitting: Cardiovascular Disease

## 2021-01-04 ENCOUNTER — Inpatient Hospital Stay: Payer: Medicare HMO

## 2021-01-04 ENCOUNTER — Other Ambulatory Visit: Payer: Self-pay

## 2021-01-04 ENCOUNTER — Inpatient Hospital Stay: Payer: Medicare HMO | Attending: Hematology and Oncology

## 2021-01-04 ENCOUNTER — Encounter: Payer: Self-pay | Admitting: Hematology and Oncology

## 2021-01-04 ENCOUNTER — Inpatient Hospital Stay: Payer: Medicare HMO | Admitting: Hematology and Oncology

## 2021-01-04 ENCOUNTER — Telehealth: Payer: Self-pay

## 2021-01-04 VITALS — BP 121/54 | HR 61 | Temp 98.5°F | Resp 18

## 2021-01-04 DIAGNOSIS — Z79899 Other long term (current) drug therapy: Secondary | ICD-10-CM | POA: Diagnosis not present

## 2021-01-04 DIAGNOSIS — G629 Polyneuropathy, unspecified: Secondary | ICD-10-CM | POA: Insufficient documentation

## 2021-01-04 DIAGNOSIS — Z7982 Long term (current) use of aspirin: Secondary | ICD-10-CM | POA: Diagnosis not present

## 2021-01-04 DIAGNOSIS — C9000 Multiple myeloma not having achieved remission: Secondary | ICD-10-CM

## 2021-01-04 DIAGNOSIS — Z9484 Stem cells transplant status: Secondary | ICD-10-CM | POA: Insufficient documentation

## 2021-01-04 DIAGNOSIS — C9002 Multiple myeloma in relapse: Secondary | ICD-10-CM

## 2021-01-04 DIAGNOSIS — Z5112 Encounter for antineoplastic immunotherapy: Secondary | ICD-10-CM | POA: Insufficient documentation

## 2021-01-04 DIAGNOSIS — M8580 Other specified disorders of bone density and structure, unspecified site: Secondary | ICD-10-CM | POA: Insufficient documentation

## 2021-01-04 DIAGNOSIS — C946 Myelodysplastic disease, not classified: Secondary | ICD-10-CM

## 2021-01-04 DIAGNOSIS — D61818 Other pancytopenia: Secondary | ICD-10-CM

## 2021-01-04 DIAGNOSIS — D469 Myelodysplastic syndrome, unspecified: Secondary | ICD-10-CM

## 2021-01-04 LAB — CBC WITH DIFFERENTIAL/PLATELET
Abs Immature Granulocytes: 0 10*3/uL (ref 0.00–0.07)
Basophils Absolute: 0 10*3/uL (ref 0.0–0.1)
Basophils Relative: 1 %
Eosinophils Absolute: 0.1 10*3/uL (ref 0.0–0.5)
Eosinophils Relative: 3 %
HCT: 36.4 % (ref 36.0–46.0)
Hemoglobin: 12.1 g/dL (ref 12.0–15.0)
Immature Granulocytes: 0 %
Lymphocytes Relative: 59 %
Lymphs Abs: 2.1 10*3/uL (ref 0.7–4.0)
MCH: 34.8 pg — ABNORMAL HIGH (ref 26.0–34.0)
MCHC: 33.2 g/dL (ref 30.0–36.0)
MCV: 104.6 fL — ABNORMAL HIGH (ref 80.0–100.0)
Monocytes Absolute: 0.6 10*3/uL (ref 0.1–1.0)
Monocytes Relative: 15 %
Neutro Abs: 0.8 10*3/uL — ABNORMAL LOW (ref 1.7–7.7)
Neutrophils Relative %: 22 %
Platelets: 186 10*3/uL (ref 150–400)
RBC: 3.48 MIL/uL — ABNORMAL LOW (ref 3.87–5.11)
RDW: 14.3 % (ref 11.5–15.5)
WBC: 3.6 10*3/uL — ABNORMAL LOW (ref 4.0–10.5)
nRBC: 0 % (ref 0.0–0.2)

## 2021-01-04 LAB — COMPREHENSIVE METABOLIC PANEL
ALT: 8 U/L (ref 0–44)
AST: 12 U/L — ABNORMAL LOW (ref 15–41)
Albumin: 3.3 g/dL — ABNORMAL LOW (ref 3.5–5.0)
Alkaline Phosphatase: 83 U/L (ref 38–126)
Anion gap: 9 (ref 5–15)
BUN: 13 mg/dL (ref 8–23)
CO2: 27 mmol/L (ref 22–32)
Calcium: 9.4 mg/dL (ref 8.9–10.3)
Chloride: 106 mmol/L (ref 98–111)
Creatinine, Ser: 0.71 mg/dL (ref 0.44–1.00)
GFR, Estimated: >60 mL/min (ref >60)
Glucose, Bld: 109 mg/dL — ABNORMAL HIGH (ref 70–99)
Potassium: 4 mmol/L (ref 3.5–5.1)
Sodium: 142 mmol/L (ref 135–145)
Total Bilirubin: 0.3 mg/dL (ref 0.3–1.2)
Total Protein: 6.4 g/dL — ABNORMAL LOW (ref 6.5–8.1)

## 2021-01-04 MED ORDER — SODIUM CHLORIDE 0.9% FLUSH
10.0000 mL | INTRAVENOUS | Status: DC | PRN
  Administered 2021-01-04: 10 mL
  Filled 2021-01-04: qty 10

## 2021-01-04 MED ORDER — ACETAMINOPHEN 325 MG PO TABS
ORAL_TABLET | ORAL | Status: AC
  Filled 2021-01-04: qty 2

## 2021-01-04 MED ORDER — FAMOTIDINE 20 MG IN NS 100 ML IVPB
INTRAVENOUS | Status: AC
  Filled 2021-01-04: qty 100

## 2021-01-04 MED ORDER — SODIUM CHLORIDE 0.9 % IV SOLN
Freq: Once | INTRAVENOUS | Status: AC
  Filled 2021-01-04: qty 250

## 2021-01-04 MED ORDER — ACETAMINOPHEN 325 MG PO TABS
650.0000 mg | ORAL_TABLET | Freq: Once | ORAL | Status: AC
  Administered 2021-01-04: 650 mg via ORAL

## 2021-01-04 MED ORDER — FAMOTIDINE 20 MG IN NS 100 ML IVPB
20.0000 mg | Freq: Once | INTRAVENOUS | Status: AC
Start: 2021-01-04 — End: 2021-01-04
  Administered 2021-01-04: 20 mg via INTRAVENOUS

## 2021-01-04 MED ORDER — SODIUM CHLORIDE 0.9% FLUSH
10.0000 mL | Freq: Once | INTRAVENOUS | Status: AC
  Administered 2021-01-04: 10 mL
  Filled 2021-01-04: qty 10

## 2021-01-04 MED ORDER — SODIUM CHLORIDE 0.9 % IV SOLN
16.0000 mg/kg | Freq: Once | INTRAVENOUS | Status: AC
  Administered 2021-01-04: 1200 mg via INTRAVENOUS
  Filled 2021-01-04: qty 60

## 2021-01-04 MED ORDER — HEPARIN SOD (PORK) LOCK FLUSH 100 UNIT/ML IV SOLN
500.0000 [IU] | Freq: Once | INTRAVENOUS | Status: AC | PRN
  Administered 2021-01-04: 500 [IU]
  Filled 2021-01-04: qty 5

## 2021-01-04 NOTE — Progress Notes (Signed)
[place] OFFICE PROGRESS NOTE  Patient Care Team: [name], [name], MD as PCP - General (Internal Medicine) [name], MD as PCP - Cardiology (Cardiology) [name], MD as Referring Physician (Hematology and Oncology) [name], MD as Consulting Physician (Internal Medicine) [name], [name], MD as Consulting Physician (Infectious Diseases) [name], MD as Consulting Physician (Hematology and Oncology) [name], NP as Nurse Practitioner (Hematology and Oncology) [name], MD as Consulting Physician (Hematology and Oncology)  ASSESSMENT & PLAN:  Multiple myeloma [facility]) She tolerated resumption of Pomalyst and daratumumab well She has chronic mild intermittent pancytopenia but remained asymptomatic She will continue aspirin for DVT prophylaxis and acyclovir for antimicrobial prophylaxis We have discontinue Zometa due to long-term use She will continue calcium with vitamin D and she will be due for another bone density scan November of this year  Pancytopenia, acquired [facility]) She has acquired pancytopenia due to treatment She is not symptomatic Observe only  No orders of the defined types were placed in this encounter.   All questions were answered. The patient knows to call the clinic with any problems, questions or concerns. The total time spent in the appointment was 20 minutes encounter with patients including review of chart and various tests results, discussions about plan of care and coordination of care plan   [name], MD 01/04/2021 4:26 PM  INTERVAL HISTORY: Please see below for problem oriented charting. She returns for further follow-up She is doing well She had to have a cast put in on her right arm and it seems to be healing slowly She has mild shortness of breath on exertion, stable The patient denies any recent signs or symptoms of bleeding such as spontaneous epistaxis, hematuria or  hematochezia.   SUMMARY OF ONCOLOGIC HISTORY: Oncology History Overview Note  Multiple myeloma, Ig A Lambda, M spike 3.54 grams, Calcium 9.2, Creatinine 0.8, Beta 2 microglobulin 4.52, IgA 4840 mg/dL, lambda light chain 75.4, albumin 3.6, hemoglobin 9.7, platelet 115    Primary site: Multiple Myeloma   Staging method: AJCC 6th Edition   Clinical: Stage IIA signed by [name], MD on 11/07/2013  2:46 PM   Summary: Stage IIA      Multiple myeloma ([place])  10/31/2013 Bone Marrow Biopsy   Bone marrow biopsy confirmed multiple myeloma with 40% bone marrow involvement. Skeletal survey showed minimal lesions in her score with generalized demineralization    11/10/2013 - 02/13/2014 Chemotherapy   The patient is started on induction chemotherapy with weekly dexamethasone 40 mg by mouth as well as Velcade subcutaneous injection on days 1, 4, 8 and 11. On 11/21/2013, she was started on monthly Zometa.    12/23/2013 Adverse Reaction   The dose of Velcade was reduced due to thrombocytopenia.    01/28/2014 - 04/07/2014 Chemotherapy   Revlimid is added. Treatment was discontinued due to lack of response.    02/24/2014 - 04/07/2014 Chemotherapy   Due to worsening peripheral neuropathy, Velcade injection is changed to once a week. Revlimid was given 21 days on, 7 days off.    04/07/2014 - 04/10/2014 Chemotherapy   Revlimid was discontinued due to lack of response. Chemotherapy was changed back to Velcade injection twice a week, 2 weeks on 1 week off. Her treatment was switched to to minimum response    04/20/2014 - 06/02/2014 Chemotherapy   chemotherapy is switched to Carfilzomib, Cytoxan and dexamethasone.    04/22/2014 Procedure   she has placement of port  for chemotherapy.    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 at [facility]    10/20/2014 - 03/24/2015 Chemotherapy    she received chemotherapy with Kyprolis, Revlimid and dexamethasone    10/22/2014 Procedure   She has port placement    01/19/2015 Tumor Marker   IgA lambda M spike at 0.4 g     01/20/2015 Miscellaneous   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 No cource was found and fever resolved with IV vancomycin and meropenem    09/13/2015 Bone Marrow Biopsy   Bone marrow biopsy showed no increased blasts, 3-4 % plasma cells    03/02/2016 Bone Marrow Biopsy   Bone marrow biopsy at [facility] showed normocellular (30%) bone marrow with trilineage hematopoiesis. No significant increase in blasts. No significant increase in plasma cells.    05/12/2016 Imaging   DEXA scan at [place] showed osteopenia    10/24/2016 Imaging   Skeletal survey at [facility], no new lesions    12/07/2017 Imaging   No focal abnormality noted to suggest myeloma. Exam is stable from prior exam.    03/01/2018 Procedure   Successful 8 French right internal jugular vein power port placement with its tip at the SVC/RA junction.    03/06/2018 -  Chemotherapy   The patient had daratumumab     07/09/2018 Bone Marrow Biopsy   Bone marrow biopsy at [facility] showed residual disease at 0.004% plasma cells    07/03/2019  Bone Marrow Biopsy   A. Bone marrow, flow cytometric analysis for multiple myeloma minimal residual disease detection:   Negative. No phenotypically abnormal plasma cells at or above the limit of detection identified.     No monotypic B-cell population identified. Negative for increased blasts.   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    10/31/2015 Miscellaneous   She has engrafted      REVIEW OF SYSTEMS:   Constitutional: Denies fevers, chills or abnormal weight loss Eyes: Denies blurriness of vision Ears, nose, mouth, throat, and face: Denies mucositis or sore throat Cardiovascular: Denies palpitation, chest discomfort or lower  extremity swelling Gastrointestinal:  Denies nausea, heartburn or change in bowel habits Skin: Denies abnormal skin rashes Lymphatics: Denies new lymphadenopathy or easy bruising Neurological:Denies numbness, tingling or new weaknesses Behavioral/Psych: Mood is stable, no new changes  All other systems were reviewed with the patient and are negative.  I have reviewed the past medical history, past surgical history, social history and family history with the patient and they are unchanged from previous note.  ALLERGIES:  has No Known Allergies.  MEDICATIONS:  Current Outpatient Medications  Medication Sig Dispense Refill   acyclovir (ZOVIRAX) 400 MG  tablet TAKE 1 TABLET BY MOUTH TWICE A DAY 180 tablet 11   aspirin 325 MG tablet Take 325 mg by mouth daily.     calcipotriene (DOVONOX) 0.005 % cream daily.      calcium carbonate (TUMS - DOSED IN MG ELEMENTAL CALCIUM) 500 MG chewable tablet Chew 1 tablet by mouth daily.     Cholecalciferol 25 MCG (1000 UT) tablet Take 1,000 Units by mouth daily.     DARATUMUMAB IV Inject into the vein every 30 (thirty) days.     diphenoxylate-atropine (LOMOTIL) 2.5-0.025 MG tablet Take 1 tablet by mouth 4 (four) times daily as needed for diarrhea or loose stools. 60 tablet 0   loperamide (IMODIUM) 2 MG capsule Take by mouth as needed for diarrhea or loose stools. As needed only     metoprolol tartrate (LOPRESSOR) 25 MG tablet TAKE 1.5 TABLETS (37.5 MG TOTAL) BY MOUTH 2 (TWO) TIMES DAILY. 270 tablet 1   mirtazapine (REMERON) 15 MG tablet Take 1 tablet (15 mg total) by mouth at bedtime. 90 tablet 1   Multiple Vitamin (MULTIVITAMIN WITH MINERALS) TABS tablet Take 1 tablet by mouth daily.     ondansetron (ZOFRAN) 4 MG tablet Take 1 tablet (4 mg total) by mouth every 8 (eight) hours as needed for nausea. 30 tablet 1   pantoprazole (PROTONIX) 40 MG tablet Take 1 tablet (40 mg total) by mouth daily. 180 tablet 2   pomalidomide (POMALYST) 2 MG capsule TAKE 1 CAPSULE DAILY ON DAYS 1 THROUGH 21, THEN 7 DAYS OFF EVERY 28 DAYS 21 capsule 0   No current facility-administered medications for this visit.   Facility-Administered Medications Ordered in Other Visits  Medication Dose Route Frequency Provider Last Rate Last Admin   sodium chloride flush (NS) 0.9 % injection 10 mL  10 mL Intracatheter PRN Alvy Bimler, Analiah Drum, MD   10 mL at 01/04/21 1205    PHYSICAL EXAMINATION: ECOG PERFORMANCE STATUS: 1 - Symptomatic but completely ambulatory  Vitals:   01/04/21 0843  BP: (!) 129/48  Pulse: (!) 57  Resp: 18  Temp: 97.9 F (36.6 C)  SpO2: 100%   Filed Weights   01/04/21 0843  Weight: 161 lb 12.8 oz (73.4 kg)     GENERAL:alert, no distress and comfortable Musculoskeletal:no cyanosis of digits and no clubbing  NEURO: alert & oriented x 3 with fluent speech, no focal motor/sensory deficits  LABORATORY DATA:  I have reviewed the data as listed    Component Value Date/Time   NA 142 01/04/2021 0818   NA 142 01/04/2017 0902   K 4.0 01/04/2021 0818   K 3.9 01/04/2017 0902   CL 106 01/04/2021 0818   CO2 27 01/04/2021 0818   CO2 28 01/04/2017 0902   GLUCOSE 109 (H) 01/04/2021 0818   GLUCOSE 92 01/04/2017 0902   BUN 13 01/04/2021 0818   BUN 9.5 01/04/2017 0902   CREATININE 0.71 01/04/2021 0818  CREATININE 0.80 12/18/2018 0901   CREATININE 0.7 01/04/2017 0902   CALCIUM 9.4 01/04/2021 0818   CALCIUM 9.0 01/04/2017 0902   PROT 6.4 (L) 01/04/2021 0818   PROT 6.0 (L) 01/04/2017 0902   PROT 5.8 (L) 01/04/2017 0902   ALBUMIN 3.3 (L) 01/04/2021 0818   ALBUMIN 3.4 (L) 01/04/2017 0902   AST 12 (L) 01/04/2021 0818   AST 18 12/18/2018 0901   AST 21 01/04/2017 0902   ALT 8 01/04/2021 0818   ALT 14 12/18/2018 0901   ALT 16 01/04/2017 0902   ALKPHOS 83 01/04/2021 0818   ALKPHOS 101 01/04/2017 0902   BILITOT 0.3 01/04/2021 0818   BILITOT 0.3 12/18/2018 0901   BILITOT 0.56 01/04/2017 0902   GFRNONAA >60 01/04/2021 0818   GFRNONAA >60 12/18/2018 0901   GFRAA >60 03/25/2020 0917   GFRAA >60 12/18/2018 0901    No results found for: SPEP, UPEP  Lab Results  Component Value Date   WBC 3.6 (L) 01/04/2021   NEUTROABS 0.8 (L) 01/04/2021   HGB 12.1 01/04/2021   HCT 36.4 01/04/2021   MCV 104.6 (H) 01/04/2021   PLT 186 01/04/2021      Chemistry      Component Value Date/Time   NA 142 01/04/2021 0818   NA 142 01/04/2017 0902   K 4.0 01/04/2021 0818   K 3.9 01/04/2017 0902   CL 106 01/04/2021 0818   CO2 27 01/04/2021 0818   CO2 28 01/04/2017 0902   BUN 13 01/04/2021 0818   BUN 9.5 01/04/2017 0902   CREATININE 0.71 01/04/2021 0818   CREATININE 0.80 12/18/2018 0901   CREATININE 0.7  01/04/2017 0902      Component Value Date/Time   CALCIUM 9.4 01/04/2021 0818   CALCIUM 9.0 01/04/2017 0902   ALKPHOS 83 01/04/2021 0818   ALKPHOS 101 01/04/2017 0902   AST 12 (L) 01/04/2021 0818   AST 18 12/18/2018 0901   AST 21 01/04/2017 0902   ALT 8 01/04/2021 0818   ALT 14 12/18/2018 0901   ALT 16 01/04/2017 0902   BILITOT 0.3 01/04/2021 0818   BILITOT 0.3 12/18/2018 0901   BILITOT 0.56 01/04/2017 0902

## 2021-01-04 NOTE — Telephone Encounter (Signed)
Per Dr. Alvy Bimler - okay to proceed with treatment with mild neutropenia. Infusion nurse notified.

## 2021-01-04 NOTE — Assessment & Plan Note (Signed)
She tolerated resumption of Pomalyst and daratumumab well She has chronic mild intermittent pancytopenia but remained asymptomatic She will continue aspirin for DVT prophylaxis and acyclovir for antimicrobial prophylaxis We have discontinue Zometa due to long-term use She will continue calcium with vitamin D and she will be due for another bone density scan November of this year

## 2021-01-04 NOTE — Patient Instructions (Signed)
[facility] MEDICAL ONCOLOGY  Discharge Instructions: Thank you for choosing [facility] to provide your oncology and hematology care.   If you have a lab appointment with the Cancer Center, please go directly to the Cancer Center and check in at the registration area.   Wear comfortable clothing and clothing appropriate for easy access to any Portacath or PICC line.   We strive to give you quality time with your provider. You may need to reschedule your appointment if you arrive late (15 or more minutes).  Arriving late affects you and other patients whose appointments are after yours.  Also, if you miss three or more appointments without notifying the office, you may be dismissed from the clinic at the provider's discretion.      For prescription refill requests, have your pharmacy contact our office and allow 72 hours for refills to be completed.    Today you received the following chemotherapy and/or immunotherapy agents darzalex      To help prevent nausea and vomiting after your treatment, we encourage you to take your nausea medication as directed.  BELOW ARE SYMPTOMS THAT SHOULD BE REPORTED IMMEDIATELY: *FEVER GREATER THAN 100.4 F (38 C) OR HIGHER *CHILLS OR SWEATING *NAUSEA AND VOMITING THAT IS NOT CONTROLLED WITH YOUR NAUSEA MEDICATION *UNUSUAL SHORTNESS OF BREATH *UNUSUAL BRUISING OR BLEEDING *URINARY PROBLEMS (pain or burning when urinating, or frequent urination) *BOWEL PROBLEMS (unusual diarrhea, constipation, pain near the anus) TENDERNESS IN MOUTH AND THROAT WITH OR WITHOUT PRESENCE OF ULCERS (sore throat, sores in mouth, or a toothache) UNUSUAL RASH, SWELLING OR PAIN  UNUSUAL VAGINAL DISCHARGE OR ITCHING   Items with * indicate a potential emergency and should be followed up as soon as possible or go to the Emergency Department if any problems should occur.  Please show the CHEMOTHERAPY ALERT CARD or IMMUNOTHERAPY ALERT CARD at check-in to  the Emergency Department and triage nurse.  Should you have questions after your visit or need to cancel or reschedule your appointment, please contact [facility]ICAL [facility]COL[facility]  Dept: [phone]  and follow the prompts.  Office hours are 8:00 a.m. to 4:30 p.m. Monday - Friday. Please note that voicemails left after 4:00 p.m. may not be returned until the following business day.  We are closed weekends and major holidays. You have access to a nurse at all times for urgent questions. Please call the main number to the clinic Dept: [phone] and follow the prompts.   For any non-urgent questions, you may also contact your provider using MyChart. We now offer e-Visits for anyone 18 and older to request care online for non-urgent symptoms. For details visit [email or web address].   Also download the MyChart app! Go to the app store, search "MyChart", open the app, select [facility], and log in with your MyChart username and password.  Due to Covid, a mask is required upon entering the hospital/clinic. If you do not have a mask, one will be given to you upon arrival. For doctor visits, patients may have 1 support person aged 18 or older with them. For treatment visits, patients cannot have anyone with them due to current Covid guidelines and our immunocompromised population.  

## 2021-01-04 NOTE — Assessment & Plan Note (Signed)
She has acquired pancytopenia due to treatment She is not symptomatic Observe only

## 2021-01-05 LAB — KAPPA/LAMBDA LIGHT CHAINS
Kappa free light chain: 17.1 mg/L (ref 3.3–19.4)
Kappa, lambda light chain ratio: 0.25 — ABNORMAL LOW (ref 0.26–1.65)
Lambda free light chains: 67.7 mg/L — ABNORMAL HIGH (ref 5.7–26.3)

## 2021-01-10 LAB — MULTIPLE MYELOMA PANEL, SERUM
Albumin SerPl Elph-Mcnc: 3.5 g/dL (ref 2.9–4.4)
Albumin/Glob SerPl: 1.6 (ref 0.7–1.7)
Alpha 1: 0.2 g/dL (ref 0.0–0.4)
Alpha2 Glob SerPl Elph-Mcnc: 0.8 g/dL (ref 0.4–1.0)
B-Globulin SerPl Elph-Mcnc: 0.7 g/dL (ref 0.7–1.3)
Gamma Glob SerPl Elph-Mcnc: 0.6 g/dL (ref 0.4–1.8)
Globulin, Total: 2.3 g/dL (ref 2.2–3.9)
IgA: 303 mg/dL (ref 64–422)
IgG (Immunoglobin G), Serum: 434 mg/dL — ABNORMAL LOW (ref 586–1602)
IgM (Immunoglobulin M), Srm: 43 mg/dL (ref 26–217)
M Protein SerPl Elph-Mcnc: 0.2 g/dL — ABNORMAL HIGH
Total Protein ELP: 5.8 g/dL — ABNORMAL LOW (ref 6.0–8.5)

## 2021-01-11 ENCOUNTER — Telehealth: Payer: Self-pay

## 2021-01-11 ENCOUNTER — Encounter: Payer: Self-pay | Admitting: Hematology and Oncology

## 2021-01-11 NOTE — Telephone Encounter (Signed)
Called regarding mychart message. Scheduled appt with Dr. Alvy Bimler on 7/21 at 0840 to discuss. She is aware of appt time.

## 2021-01-13 ENCOUNTER — Other Ambulatory Visit: Payer: Self-pay

## 2021-01-13 ENCOUNTER — Inpatient Hospital Stay: Payer: Medicare HMO | Admitting: Hematology and Oncology

## 2021-01-13 ENCOUNTER — Encounter: Payer: Self-pay | Admitting: Hematology and Oncology

## 2021-01-13 DIAGNOSIS — Z5112 Encounter for antineoplastic immunotherapy: Secondary | ICD-10-CM | POA: Diagnosis not present

## 2021-01-13 DIAGNOSIS — C9000 Multiple myeloma not having achieved remission: Secondary | ICD-10-CM | POA: Diagnosis not present

## 2021-01-13 NOTE — Assessment & Plan Note (Signed)
She has significant, major concern due to rising lambda light chain level and intermittent M protein detectable on her myeloma panel Overall, she is not symptomatic We discussed the goals of care It is not likely that I can get her to achieve complete response on current treatment As long as she tolerated the treatment well and the M protein is not over 1 g or light chain ratio is not over 50, I recommend we continue current prescribed therapy The patient is already neutropenic on current dose of Pomalyst and unlikely be able to tolerate higher dose of therapy We will continue to follow myeloma panel monthly She is due for follow-up at [facility] in 6 months I reassured the patient that it is okay to observe the current trend of her myeloma panel without changing her treatment

## 2021-01-13 NOTE — Progress Notes (Signed)
[facility] OFFICE PROGRESS NOTE  Patient Care Team: [name], [name], MD as PCP - General (Internal Medicine) [name], MD as PCP - Cardiology (Cardiology) [name], MD as Referring Physician (Hematology and Oncology) [name], MD as Consulting Physician (Internal Medicine) [name], [name], MD as Consulting Physician (Infectious Diseases) [name], MD as Consulting Physician (Hematology and Oncology) [name], NP as Nurse Practitioner (Hematology and Oncology) [name], MD as Consulting Physician (Hematology and Oncology)  ASSESSMENT & PLAN:  Multiple myeloma [facility]) She has significant, major concern due to rising lambda light chain level and intermittent M protein detectable on her myeloma panel Overall, she is not symptomatic We discussed the goals of care It is not likely that I can get her to achieve complete response on current treatment As long as she tolerated the treatment well and the M protein is not over 1 g or light chain ratio is not over 50, I recommend we continue current prescribed therapy The patient is already neutropenic on current dose of Pomalyst and unlikely be able to tolerate higher dose of therapy We will continue to follow myeloma panel monthly She is due for follow-up at [facility] in 6 months I reassured the patient that it is okay to observe the current trend of her myeloma panel without changing her treatment  No orders of the defined types were placed in this encounter.   All questions were answered. The patient knows to call the clinic with any problems, questions or concerns. The total time spent in the appointment was 20 minutes encounter with patients including review of chart and various tests results, discussions about plan of care and coordination of care plan   [name], MD 01/13/2021 9:40 AM  INTERVAL HISTORY: Please see below for problem oriented charting. The  purpose of her follow-up today is to review recent test results She is very concerned given the trend of intermittent detectable M protein spike as well as rising lambda light chain Clinically, she is not symptomatic She denies recent infection No new bone pain  SUMMARY OF ONCOLOGIC HISTORY: Oncology History Overview Note  Multiple myeloma, Ig A Lambda, M spike 3.54 grams, Calcium 9.2, Creatinine 0.8, Beta 2 microglobulin 4.52, IgA 4840 mg/dL, lambda light chain 75.4, albumin 3.6, hemoglobin 9.7, platelet 115    Primary site: Multiple Myeloma   Staging method: AJCC 6th Edition   Clinical: Stage IIA signed by [name], MD on 11/07/2013  2:46 PM   Summary: Stage IIA      Multiple myeloma ([place])  10/31/2013 Bone Marrow Biopsy   Bone marrow biopsy confirmed multiple myeloma with 40% bone marrow involvement. Skeletal survey showed minimal lesions in her score with generalized demineralization    11/10/2013 - 02/13/2014 Chemotherapy   The patient is started on induction chemotherapy with weekly dexamethasone 40 mg by mouth as well as Velcade subcutaneous injection on days 1, 4, 8 and 11. On 11/21/2013, she was started on monthly Zometa.    12/23/2013 Adverse Reaction   The dose of Velcade was reduced due to thrombocytopenia.    01/28/2014 - 04/07/2014 Chemotherapy   Revlimid is added. Treatment was discontinued due to lack of response.    02/24/2014 - 04/07/2014 Chemotherapy   Due to worsening peripheral neuropathy, Velcade injection is changed to once a week. Revlimid was given 21 days on, 7 days off.    04/07/2014 - 04/10/2014 Chemotherapy   Revlimid was discontinued due to lack of  response. Chemotherapy was changed back to Velcade injection twice a week, 2 weeks on 1 week off. Her treatment was switched to to minimum response    04/20/2014 - 06/02/2014 Chemotherapy   chemotherapy is switched to Carfilzomib, Cytoxan and dexamethasone.    04/22/2014 Procedure   she has placement of  port for chemotherapy.    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 at [facility]    10/20/2014 - 03/24/2015 Chemotherapy    she received chemotherapy with Kyprolis, Revlimid and dexamethasone    10/22/2014 Procedure   She has port placement    01/19/2015 Tumor Marker   IgA lambda M spike at 0.4 g     01/20/2015 Miscellaneous   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 No cource was found and fever resolved with IV vancomycin and meropenem    09/13/2015 Bone Marrow Biopsy   Bone marrow biopsy showed no increased blasts, 3-4 % plasma cells    03/02/2016 Bone Marrow Biopsy   Bone marrow biopsy at [facility] showed normocellular (30%) bone marrow with trilineage hematopoiesis. No significant increase in blasts. No significant increase in plasma cells.    05/12/2016 Imaging   DEXA scan at [place] showed osteopenia    10/24/2016 Imaging   Skeletal survey at [facility], no new lesions    12/07/2017 Imaging   No focal abnormality noted to suggest myeloma. Exam is stable from prior exam.    03/01/2018 Procedure   Successful 8 French right internal jugular vein  power port placement with its tip at the SVC/RA junction.    03/06/2018 -  Chemotherapy   The patient had daratumumab     07/09/2018 Bone Marrow Biopsy   Bone marrow biopsy at [facility] showed residual disease at 0.004% plasma cells    07/03/2019 Bone Marrow Biopsy   A. Bone marrow, flow cytometric analysis for multiple myeloma minimal residual disease detection:   Negative. No phenotypically abnormal plasma cells at or above the limit of detection identified.     No monotypic B-cell population identified. Negative for increased blasts.   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    10/31/2015 Miscellaneous   She has engrafted      REVIEW OF SYSTEMS:   Constitutional: Denies fevers, chills or abnormal weight loss Eyes: Denies blurriness of vision Ears, nose, mouth, throat, and face: Denies mucositis or sore throat Respiratory: Denies cough, dyspnea or wheezes Cardiovascular: Denies palpitation, chest discomfort or lower extremity swelling Gastrointestinal:  Denies nausea, heartburn or change in bowel habits Skin: Denies abnormal skin rashes Lymphatics: Denies new lymphadenopathy or easy bruising Neurological:Denies numbness, tingling or new weaknesses Behavioral/Psych: Mood is stable, no new changes  All other systems were reviewed with the patient and are negative.  I have reviewed the  past medical history, past surgical history, social history and family history with the patient and they are unchanged from previous note.  ALLERGIES:  has No Known Allergies.  MEDICATIONS:  Current Outpatient Medications  Medication Sig Dispense Refill   acyclovir (ZOVIRAX) 400 MG tablet TAKE 1 TABLET BY MOUTH TWICE A DAY 180 tablet 11   aspirin 325 MG tablet Take 325 mg by mouth daily.     calcipotriene (DOVONOX) 0.005 % cream daily.      calcium carbonate (TUMS - DOSED IN MG ELEMENTAL CALCIUM) 500 MG chewable tablet Chew 1 tablet by mouth daily.     Cholecalciferol 25 MCG (1000 UT) tablet Take 1,000 Units by mouth daily.     DARATUMUMAB IV Inject into the vein every 30 (thirty) days.     diphenoxylate-atropine (LOMOTIL) 2.5-0.025 MG tablet Take 1 tablet by mouth 4 (four) times daily as needed for diarrhea or loose stools. 60 tablet 0   loperamide (IMODIUM) 2 MG capsule Take by mouth as needed for diarrhea or loose stools. As needed only     metoprolol tartrate (LOPRESSOR) 25 MG tablet TAKE 1.5 TABLETS (37.5 MG TOTAL) BY MOUTH 2 (TWO) TIMES DAILY. 270 tablet 1   mirtazapine (REMERON) 15 MG tablet Take 1 tablet (15 mg total) by mouth at bedtime. 90 tablet 1   Multiple Vitamin (MULTIVITAMIN WITH MINERALS) TABS tablet Take 1 tablet by mouth daily.     ondansetron (ZOFRAN) 4 MG tablet Take 1 tablet (4 mg total) by mouth every 8 (eight) hours as needed for nausea. 30 tablet 1   pantoprazole (PROTONIX) 40 MG tablet Take 1 tablet (40 mg total) by mouth daily. 180 tablet 2   pomalidomide (POMALYST) 2 MG capsule TAKE 1 CAPSULE DAILY ON DAYS 1 THROUGH 21, THEN 7 DAYS OFF EVERY 28 DAYS 21 capsule 0   No current facility-administered medications for this visit.    PHYSICAL EXAMINATION: ECOG PERFORMANCE STATUS: 0 - Asymptomatic  Vitals:   01/13/21 0841  BP: (!) 125/58  Pulse: 66  Resp: 18  Temp: (!) 97.5 F (36.4 C)  SpO2: 99%   Filed Weights   01/13/21 0841  Weight: 161 lb 12.8 oz (73.4  kg)    GENERAL:alert, no distress and comfortable Musculoskeletal:no cyanosis of digits and no clubbing  NEURO: alert & oriented x 3 with fluent speech, no focal motor/sensory deficits  LABORATORY DATA:  I have reviewed the data as listed    Component Value Date/Time   NA 142 01/04/2021 0818   NA 142 01/04/2017 0902   K 4.0 01/04/2021 0818   K 3.9 01/04/2017 0902   CL 106 01/04/2021 0818   CO2 27 01/04/2021 0818   CO2 28 01/04/2017 0902   GLUCOSE 109 (H) 01/04/2021 0818   GLUCOSE 92 01/04/2017 0902   BUN 13 01/04/2021 0818  BUN 9.5 01/04/2017 0902   CREATININE 0.71 01/04/2021 0818   CREATININE 0.80 12/18/2018 0901   CREATININE 0.7 01/04/2017 0902   CALCIUM 9.4 01/04/2021 0818   CALCIUM 9.0 01/04/2017 0902   PROT 6.4 (L) 01/04/2021 0818   PROT 6.0 (L) 01/04/2017 0902   PROT 5.8 (L) 01/04/2017 0902   ALBUMIN 3.3 (L) 01/04/2021 0818   ALBUMIN 3.4 (L) 01/04/2017 0902   AST 12 (L) 01/04/2021 0818   AST 18 12/18/2018 0901   AST 21 01/04/2017 0902   ALT 8 01/04/2021 0818   ALT 14 12/18/2018 0901   ALT 16 01/04/2017 0902   ALKPHOS 83 01/04/2021 0818   ALKPHOS 101 01/04/2017 0902   BILITOT 0.3 01/04/2021 0818   BILITOT 0.3 12/18/2018 0901   BILITOT 0.56 01/04/2017 0902   GFRNONAA >60 01/04/2021 0818   GFRNONAA >60 12/18/2018 0901   GFRAA >60 03/25/2020 0917   GFRAA >60 12/18/2018 0901    No results found for: SPEP, UPEP  Lab Results  Component Value Date   WBC 3.6 (L) 01/04/2021   NEUTROABS 0.8 (L) 01/04/2021   HGB 12.1 01/04/2021   HCT 36.4 01/04/2021   MCV 104.6 (H) 01/04/2021   PLT 186 01/04/2021      Chemistry      Component Value Date/Time   NA 142 01/04/2021 0818   NA 142 01/04/2017 0902   K 4.0 01/04/2021 0818   K 3.9 01/04/2017 0902   CL 106 01/04/2021 0818   CO2 27 01/04/2021 0818   CO2 28 01/04/2017 0902   BUN 13 01/04/2021 0818   BUN 9.5 01/04/2017 0902   CREATININE 0.71 01/04/2021 0818   CREATININE 0.80 12/18/2018 0901   CREATININE 0.7  01/04/2017 0902      Component Value Date/Time   CALCIUM 9.4 01/04/2021 0818   CALCIUM 9.0 01/04/2017 0902   ALKPHOS 83 01/04/2021 0818   ALKPHOS 101 01/04/2017 0902   AST 12 (L) 01/04/2021 0818   AST 18 12/18/2018 0901   AST 21 01/04/2017 0902   ALT 8 01/04/2021 0818   ALT 14 12/18/2018 0901   ALT 16 01/04/2017 0902   BILITOT 0.3 01/04/2021 0818   BILITOT 0.3 12/18/2018 0901   BILITOT 0.56 01/04/2017 0902

## 2021-01-18 ENCOUNTER — Other Ambulatory Visit: Payer: Self-pay

## 2021-01-18 MED ORDER — POMALIDOMIDE 2 MG PO CAPS: ORAL_CAPSULE | ORAL | 0 refills | Status: DC

## 2021-02-01 ENCOUNTER — Other Ambulatory Visit: Payer: Self-pay

## 2021-02-01 ENCOUNTER — Inpatient Hospital Stay: Payer: Medicare HMO | Admitting: Hematology and Oncology

## 2021-02-01 ENCOUNTER — Inpatient Hospital Stay: Payer: Medicare HMO | Attending: Hematology and Oncology

## 2021-02-01 ENCOUNTER — Other Ambulatory Visit: Payer: Self-pay | Admitting: Hematology and Oncology

## 2021-02-01 ENCOUNTER — Encounter: Payer: Self-pay | Admitting: Hematology and Oncology

## 2021-02-01 ENCOUNTER — Inpatient Hospital Stay: Payer: Medicare HMO

## 2021-02-01 ENCOUNTER — Encounter: Payer: Self-pay | Admitting: General Practice

## 2021-02-01 VITALS — BP 123/59 | HR 63 | Temp 97.8°F | Resp 17

## 2021-02-01 DIAGNOSIS — Z9484 Stem cells transplant status: Secondary | ICD-10-CM | POA: Diagnosis not present

## 2021-02-01 DIAGNOSIS — M8580 Other specified disorders of bone density and structure, unspecified site: Secondary | ICD-10-CM | POA: Insufficient documentation

## 2021-02-01 DIAGNOSIS — Z79899 Other long term (current) drug therapy: Secondary | ICD-10-CM | POA: Diagnosis not present

## 2021-02-01 DIAGNOSIS — D801 Nonfamilial hypogammaglobulinemia: Secondary | ICD-10-CM | POA: Insufficient documentation

## 2021-02-01 DIAGNOSIS — Z5112 Encounter for antineoplastic immunotherapy: Secondary | ICD-10-CM | POA: Diagnosis present

## 2021-02-01 DIAGNOSIS — D696 Thrombocytopenia, unspecified: Secondary | ICD-10-CM | POA: Diagnosis not present

## 2021-02-01 DIAGNOSIS — D469 Myelodysplastic syndrome, unspecified: Secondary | ICD-10-CM

## 2021-02-01 DIAGNOSIS — C9000 Multiple myeloma not having achieved remission: Secondary | ICD-10-CM | POA: Diagnosis not present

## 2021-02-01 DIAGNOSIS — Z7982 Long term (current) use of aspirin: Secondary | ICD-10-CM | POA: Diagnosis not present

## 2021-02-01 DIAGNOSIS — R0602 Shortness of breath: Secondary | ICD-10-CM | POA: Diagnosis not present

## 2021-02-01 DIAGNOSIS — C9002 Multiple myeloma in relapse: Secondary | ICD-10-CM

## 2021-02-01 DIAGNOSIS — R197 Diarrhea, unspecified: Secondary | ICD-10-CM

## 2021-02-01 LAB — CBC WITH DIFFERENTIAL/PLATELET
Abs Immature Granulocytes: 0.01 10*3/uL (ref 0.00–0.07)
Basophils Absolute: 0 10*3/uL (ref 0.0–0.1)
Basophils Relative: 1 %
Eosinophils Absolute: 0 10*3/uL (ref 0.0–0.5)
Eosinophils Relative: 0 %
HCT: 38 % (ref 36.0–46.0)
Hemoglobin: 12.3 g/dL (ref 12.0–15.0)
Immature Granulocytes: 0 %
Lymphocytes Relative: 46 %
Lymphs Abs: 2.1 10*3/uL (ref 0.7–4.0)
MCH: 34.6 pg — ABNORMAL HIGH (ref 26.0–34.0)
MCHC: 32.4 g/dL (ref 30.0–36.0)
MCV: 106.7 fL — ABNORMAL HIGH (ref 80.0–100.0)
Monocytes Absolute: 1 10*3/uL (ref 0.1–1.0)
Monocytes Relative: 21 %
Neutro Abs: 1.5 10*3/uL — ABNORMAL LOW (ref 1.7–7.7)
Neutrophils Relative %: 32 %
Platelets: 239 10*3/uL (ref 150–400)
RBC: 3.56 MIL/uL — ABNORMAL LOW (ref 3.87–5.11)
RDW: 13.9 % (ref 11.5–15.5)
WBC: 4.7 10*3/uL (ref 4.0–10.5)
nRBC: 0 % (ref 0.0–0.2)

## 2021-02-01 LAB — COMPREHENSIVE METABOLIC PANEL
ALT: 9 U/L (ref 0–44)
AST: 11 U/L — ABNORMAL LOW (ref 15–41)
Albumin: 3.4 g/dL — ABNORMAL LOW (ref 3.5–5.0)
Alkaline Phosphatase: 70 U/L (ref 38–126)
Anion gap: 8 (ref 5–15)
BUN: 16 mg/dL (ref 8–23)
CO2: 27 mmol/L (ref 22–32)
Calcium: 9.8 mg/dL (ref 8.9–10.3)
Chloride: 108 mmol/L (ref 98–111)
Creatinine, Ser: 0.74 mg/dL (ref 0.44–1.00)
GFR, Estimated: >60 mL/min (ref >60)
Glucose, Bld: 97 mg/dL (ref 70–99)
Potassium: 4.2 mmol/L (ref 3.5–5.1)
Sodium: 143 mmol/L (ref 135–145)
Total Bilirubin: 0.3 mg/dL (ref 0.3–1.2)
Total Protein: 6.3 g/dL — ABNORMAL LOW (ref 6.5–8.1)

## 2021-02-01 MED ORDER — FAMOTIDINE 20 MG IN NS 100 ML IVPB
INTRAVENOUS | Status: AC
  Filled 2021-02-01: qty 100

## 2021-02-01 MED ORDER — IMMUNE GLOBULIN (HUMAN) 10 GM/100ML IV SOLN: 1.0000 g/kg | Freq: Once | INTRAVENOUS | Status: DC

## 2021-02-01 MED ORDER — SODIUM CHLORIDE 0.9% FLUSH
10.0000 mL | INTRAVENOUS | Status: DC | PRN
  Administered 2021-02-01: 10 mL
  Filled 2021-02-01: qty 10

## 2021-02-01 MED ORDER — FAMOTIDINE 20 MG IN NS 100 ML IVPB: 20.0000 mg | Freq: Once | INTRAVENOUS | Status: DC | PRN

## 2021-02-01 MED ORDER — HEPARIN SOD (PORK) LOCK FLUSH 100 UNIT/ML IV SOLN
250.0000 [IU] | Freq: Once | INTRAVENOUS | Status: DC | PRN
  Filled 2021-02-01: qty 5

## 2021-02-01 MED ORDER — DEXTROSE 5 % IV SOLN
Freq: Once | INTRAVENOUS | Status: DC
  Filled 2021-02-01: qty 250

## 2021-02-01 MED ORDER — HEPARIN SOD (PORK) LOCK FLUSH 100 UNIT/ML IV SOLN
500.0000 [IU] | Freq: Once | INTRAVENOUS | Status: AC | PRN
  Administered 2021-02-01: 500 [IU]
  Filled 2021-02-01: qty 5

## 2021-02-01 MED ORDER — EPINEPHRINE 0.3 MG/0.3ML IJ SOAJ: 0.3000 mg | Freq: Once | INTRAMUSCULAR | Status: DC | PRN

## 2021-02-01 MED ORDER — HEPARIN SOD (PORK) LOCK FLUSH 100 UNIT/ML IV SOLN
500.0000 [IU] | Freq: Once | INTRAVENOUS | Status: DC | PRN
  Filled 2021-02-01: qty 5

## 2021-02-01 MED ORDER — ALBUTEROL SULFATE (2.5 MG/3ML) 0.083% IN NEBU
2.5000 mg | INHALATION_SOLUTION | Freq: Once | RESPIRATORY_TRACT | Status: DC | PRN
  Filled 2021-02-01: qty 3

## 2021-02-01 MED ORDER — ALTEPLASE 2 MG IJ SOLR
2.0000 mg | Freq: Once | INTRAMUSCULAR | Status: DC | PRN
  Filled 2021-02-01: qty 2

## 2021-02-01 MED ORDER — SODIUM CHLORIDE 0.9 % IV SOLN
16.0000 mg/kg | Freq: Once | INTRAVENOUS | Status: AC
  Administered 2021-02-01: 1200 mg via INTRAVENOUS
  Filled 2021-02-01: qty 60

## 2021-02-01 MED ORDER — FAMOTIDINE 20 MG IN NS 100 ML IVPB
20.0000 mg | Freq: Once | INTRAVENOUS | Status: AC
  Administered 2021-02-01: 20 mg via INTRAVENOUS

## 2021-02-01 MED ORDER — SODIUM CHLORIDE 0.9% FLUSH
3.0000 mL | Freq: Once | INTRAVENOUS | Status: DC | PRN
  Filled 2021-02-01: qty 10

## 2021-02-01 MED ORDER — DIPHENHYDRAMINE HCL 25 MG PO TABS
25.0000 mg | ORAL_TABLET | Freq: Once | ORAL | Status: DC
  Filled 2021-02-01: qty 1

## 2021-02-01 MED ORDER — SODIUM CHLORIDE 0.9% FLUSH
10.0000 mL | Freq: Once | INTRAVENOUS | Status: AC
  Administered 2021-02-01: 10 mL
  Filled 2021-02-01: qty 10

## 2021-02-01 MED ORDER — SODIUM CHLORIDE 0.9 % IV SOLN
Freq: Once | INTRAVENOUS | Status: AC
  Filled 2021-02-01: qty 250

## 2021-02-01 MED ORDER — ACETAMINOPHEN 325 MG PO TABS
ORAL_TABLET | ORAL | Status: AC
  Filled 2021-02-01: qty 2

## 2021-02-01 MED ORDER — DIPHENHYDRAMINE HCL 50 MG/ML IJ SOLN: 50.0000 mg | Freq: Once | INTRAMUSCULAR | Status: DC | PRN

## 2021-02-01 MED ORDER — SODIUM CHLORIDE 0.9 % IV SOLN
Freq: Once | INTRAVENOUS | Status: DC | PRN
  Filled 2021-02-01: qty 250

## 2021-02-01 MED ORDER — ACETAMINOPHEN 325 MG PO TABS
650.0000 mg | ORAL_TABLET | Freq: Once | ORAL | Status: AC
  Administered 2021-02-01: 650 mg via ORAL

## 2021-02-01 MED ORDER — METHYLPREDNISOLONE SODIUM SUCC 125 MG IJ SOLR: 125.0000 mg | Freq: Once | INTRAMUSCULAR | Status: DC | PRN

## 2021-02-01 MED ORDER — ACETAMINOPHEN 325 MG PO TABS: 650.0000 mg | ORAL_TABLET | Freq: Once | ORAL | Status: DC

## 2021-02-01 NOTE — Assessment & Plan Note (Signed)
She had mild diarrhea intermittently likely food related She will take Imodium as needed

## 2021-02-01 NOTE — Assessment & Plan Note (Signed)
I have reviewed her recent myeloma panel Even though her light chains started to trend up, overall, this is minimally increased She has no signs or symptoms of organ damage I think it is reasonable to wait until February for her to get repeat bone marrow aspirate and biopsy and follow-up at [facility] In the meantime, we will continue her current prescribed treatment

## 2021-02-01 NOTE — Assessment & Plan Note (Signed)
Likely due to deconditioning I encouraged her to continue to increase activity as tolerated

## 2021-02-01 NOTE — Progress Notes (Signed)
[place] Spiritual Care Note  Connected with Leoni in infusion. We are acquainted through [facility], though she tends to attend one through [facility] now. She is maintaining a positive attitude through coping with a wrist cast and periodic treatment for multiple myeloma, and participation in the support group provides additional meaning and connection. Delila is aware of ongoing chaplain availability, should needs arise.   [place], [place], [facility] Pager [phone] Voicemail [phone]

## 2021-02-01 NOTE — Assessment & Plan Note (Signed)
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

## 2021-02-01 NOTE — Progress Notes (Signed)
[place] OFFICE PROGRESS NOTE  Patient Care Team: [name], [name], MD as PCP - General (Internal Medicine) [name], MD as PCP - Cardiology (Cardiology) [name], MD as Referring Physician (Hematology and Oncology) [name], MD as Consulting Physician (Internal Medicine) [name], [name], MD as Consulting Physician (Infectious Diseases) [name], MD as Consulting Physician (Hematology and Oncology) [name], NP as Nurse Practitioner (Hematology and Oncology) [name], MD as Consulting Physician (Hematology and Oncology)  ASSESSMENT & PLAN:  Multiple myeloma [facility]) I have reviewed her recent myeloma panel Even though her light chains started to trend up, overall, this is minimally increased She has no signs or symptoms of organ damage I think it is reasonable to wait until February for her to get repeat bone marrow aspirate and biopsy and follow-up at [facility] In the meantime, we will continue her current prescribed treatment  Acquired hypogammaglobulinemia She has mild hypogammaglobulinemia secondary to treatment but not unexpected She does not need IVIG treatment Monitor closely  Exertional shortness of breath Likely due to deconditioning I encouraged her to continue to increase activity as tolerated  Diarrhea She had mild diarrhea intermittently likely food related She will take Imodium as needed  No orders of the defined types were placed in this encounter.   All questions were answered. The patient knows to call the clinic with any problems, questions or concerns. The total time spent in the appointment was 20 minutes encounter with patients including review of chart and various tests results, discussions about plan of care and coordination of care plan   [name], MD 02/01/2021 10:52 AM  INTERVAL HISTORY: Please see below for problem oriented charting. She returns for further  follow-up Her forearm fracture is healing slowly She has no recent infection fever or chills Appetite is fair She had recent diarrhea and lost some weight due to spicy food intake  SUMMARY OF ONCOLOGIC HISTORY: Oncology History Overview Note  Multiple myeloma, Ig A Lambda, M spike 3.54 grams, Calcium 9.2, Creatinine 0.8, Beta 2 microglobulin 4.52, IgA 4840 mg/dL, lambda light chain 75.4, albumin 3.6, hemoglobin 9.7, platelet 115    Primary site: Multiple Myeloma   Staging method: AJCC 6th Edition   Clinical: Stage IIA signed by [name], MD on 11/07/2013  2:46 PM   Summary: Stage IIA      Multiple myeloma ([place])  10/31/2013 Bone Marrow Biopsy   Bone marrow biopsy confirmed multiple myeloma with 40% bone marrow involvement. Skeletal survey showed minimal lesions in her score with generalized demineralization    11/10/2013 - 02/13/2014 Chemotherapy   The patient is started on induction chemotherapy with weekly dexamethasone 40 mg by mouth as well as Velcade subcutaneous injection on days 1, 4, 8 and 11. On 11/21/2013, she was started on monthly Zometa.    12/23/2013 Adverse Reaction   The dose of Velcade was reduced due to thrombocytopenia.    01/28/2014 - 04/07/2014 Chemotherapy   Revlimid is added. Treatment was discontinued due to lack of response.    02/24/2014 - 04/07/2014 Chemotherapy   Due to worsening peripheral neuropathy, Velcade injection is changed to once a week. Revlimid was given 21 days on, 7 days off.    04/07/2014 - 04/10/2014 Chemotherapy   Revlimid was discontinued due to lack of response. Chemotherapy was changed back to Velcade injection twice a week, 2 weeks on 1 week off. Her treatment was switched to to minimum response    04/20/2014 -  06/02/2014 Chemotherapy   chemotherapy is switched to Carfilzomib, Cytoxan and dexamethasone.    04/22/2014 Procedure   she has placement of port for chemotherapy.    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 at [facility]    10/20/2014 - 03/24/2015 Chemotherapy    she received chemotherapy with Kyprolis, Revlimid and dexamethasone    10/22/2014 Procedure   She has port placement    01/19/2015 Tumor Marker   IgA lambda M spike at 0.4 g     01/20/2015 Miscellaneous   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 No cource was found and fever resolved with IV vancomycin and meropenem    09/13/2015 Bone Marrow Biopsy   Bone marrow biopsy showed no increased blasts, 3-4 % plasma cells    03/02/2016 Bone Marrow Biopsy   Bone marrow biopsy at [facility] showed normocellular (30%) bone marrow with trilineage hematopoiesis. No significant increase in blasts. No significant increase in plasma cells.    05/12/2016 Imaging   DEXA scan at [place] showed osteopenia    10/24/2016 Imaging   Skeletal survey at [facility], no new lesions    12/07/2017 Imaging   No focal abnormality noted to suggest myeloma. Exam is stable from prior exam.    03/01/2018 Procedure   Successful 8 French right internal jugular vein power port placement with its tip at the SVC/RA junction.    03/06/2018 -   Chemotherapy   The patient had daratumumab     07/09/2018 Bone Marrow Biopsy   Bone marrow biopsy at [facility] showed residual disease at 0.004% plasma cells    07/03/2019 Bone Marrow Biopsy   A. Bone marrow, flow cytometric analysis for multiple myeloma minimal residual disease detection:   Negative. No phenotypically abnormal plasma cells at or above the limit of detection identified.     No monotypic B-cell population identified. Negative for increased blasts.   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    10/31/2015 Miscellaneous   She has engrafted      REVIEW OF SYSTEMS:   Constitutional: Denies fevers, chills or abnormal weight  loss Eyes: Denies blurriness of vision Ears, nose, mouth, throat, and face: Denies mucositis or sore throat Respiratory: Denies cough, dyspnea or wheezes Cardiovascular: Denies palpitation, chest discomfort or lower extremity swelling Skin: Denies abnormal skin rashes Lymphatics: Denies new lymphadenopathy or easy bruising Neurological:Denies numbness, tingling or new weaknesses Behavioral/Psych: Mood is stable, no new changes  All other systems were reviewed with the patient and are negative.  I have reviewed the past medical history, past surgical history, social history and family history with the patient and they are unchanged from previous  note.  ALLERGIES:  has No Known Allergies.  MEDICATIONS:  Current Outpatient Medications  Medication Sig Dispense Refill   acyclovir (ZOVIRAX) 400 MG tablet TAKE 1 TABLET BY MOUTH TWICE A DAY 180 tablet 11   aspirin 325 MG tablet Take 325 mg by mouth daily.     calcipotriene (DOVONOX) 0.005 % cream daily.      calcium carbonate (TUMS - DOSED IN MG ELEMENTAL CALCIUM) 500 MG chewable tablet Chew 1 tablet by mouth daily.     Cholecalciferol 25 MCG (1000 UT) tablet Take 1,000 Units by mouth daily.     DARATUMUMAB IV Inject into the vein every 30 (thirty) days.     diphenoxylate-atropine (LOMOTIL) 2.5-0.025 MG tablet Take 1 tablet by mouth 4 (four) times daily as needed for diarrhea or loose stools. 60 tablet 0   loperamide (IMODIUM) 2 MG capsule Take by mouth as needed for diarrhea or loose stools. As needed only     metoprolol tartrate (LOPRESSOR) 25 MG tablet TAKE 1.5 TABLETS (37.5 MG TOTAL) BY MOUTH 2 (TWO) TIMES DAILY. 270 tablet 1   mirtazapine (REMERON) 15 MG tablet Take 1 tablet (15 mg total) by mouth at bedtime. 90 tablet 1   Multiple Vitamin (MULTIVITAMIN WITH MINERALS) TABS tablet Take 1 tablet by mouth daily.     ondansetron (ZOFRAN) 4 MG tablet Take 1 tablet (4 mg total) by mouth every 8 (eight) hours as needed for nausea. 30 tablet 1   pantoprazole (PROTONIX) 40 MG tablet Take 1 tablet (40 mg total) by mouth daily. 180 tablet 2   pomalidomide (POMALYST) 2 MG capsule TAKE 1 CAPSULE DAILY ON DAYS 1 THROUGH 21, THEN 7 DAYS OFF EVERY 28 DAYS 21 capsule 0   No current facility-administered medications for this visit.   Facility-Administered Medications Ordered in Other Visits  Medication Dose Route Frequency Provider Last Rate Last Admin   daratumumab (DARZALEX) 1,200 mg in sodium chloride 0.9 % 440 mL chemo infusion  16 mg/kg (Treatment Plan Recorded) Intravenous Once Alvy Bimler, Larin Weissberg, MD       famotidine (PEPCID) IVPB 20 mg in NS 100 mL IVPB  20 mg Intravenous Once Alvy Bimler, Keeshawn Fakhouri, MD 400  mL/hr at 02/01/21 1047 20 mg at 02/01/21 1047   heparin lock flush 100 unit/mL  500 Units Intracatheter Once PRN Alvy Bimler, Zaim Nitta, MD       sodium chloride flush (NS) 0.9 % injection 10 mL  10 mL Intracatheter PRN Alvy Bimler, Angell Honse, MD        PHYSICAL EXAMINATION: ECOG PERFORMANCE STATUS: 1 - Symptomatic but completely ambulatory  Vitals:   02/01/21 0955  BP: 128/85  Pulse: 64  Resp: 18  Temp: (!) 97.5 F (36.4 C)  SpO2: 100%   Filed Weights   02/01/21 0955  Weight: 159 lb 12.8 oz (72.5 kg)    GENERAL:alert, no distress and comfortable NEURO: alert & oriented x 3 with fluent speech, no focal motor/sensory deficits  LABORATORY DATA:  I have reviewed the data as listed    Component Value Date/Time   NA 143 02/01/2021 0945   NA 142 01/04/2017 0902   K 4.2 02/01/2021 0945   K 3.9 01/04/2017 0902   CL 108 02/01/2021 0945   CO2 27 02/01/2021 0945   CO2 28 01/04/2017 0902   GLUCOSE 97 02/01/2021 0945   GLUCOSE 92 01/04/2017 0902   BUN 16 02/01/2021 0945   BUN 9.5 01/04/2017 0902   CREATININE 0.74 02/01/2021 0945   CREATININE 0.80 12/18/2018 0901   CREATININE 0.7 01/04/2017 0902   CALCIUM 9.8 02/01/2021 0945   CALCIUM 9.0 01/04/2017 0902   PROT 6.3 (L) 02/01/2021 0945   PROT 6.0 (L) 01/04/2017 0902   PROT 5.8 (L) 01/04/2017 0902   ALBUMIN 3.4 (L) 02/01/2021 0945   ALBUMIN 3.4 (L) 01/04/2017 0902   AST 11 (L) 02/01/2021 0945   AST 18 12/18/2018 0901   AST 21 01/04/2017 0902   ALT 9 02/01/2021 0945   ALT 14 12/18/2018 0901   ALT 16 01/04/2017 0902   ALKPHOS 70 02/01/2021 0945   ALKPHOS 101 01/04/2017 0902   BILITOT 0.3 02/01/2021 0945   BILITOT 0.3 12/18/2018 0901   BILITOT 0.56 01/04/2017 0902   GFRNONAA >60 02/01/2021 0945   GFRNONAA >60 12/18/2018 0901   GFRAA >60 03/25/2020 0917   GFRAA >60 12/18/2018 0901    No results found for: SPEP, UPEP  Lab Results  Component Value Date   WBC 4.7 02/01/2021   NEUTROABS 1.5 (L) 02/01/2021   HGB 12.3 02/01/2021   HCT 38.0  02/01/2021   MCV 106.7 (H) 02/01/2021   PLT 239 02/01/2021      Chemistry      Component Value Date/Time   NA 143 02/01/2021 0945   NA 142 01/04/2017 0902   K 4.2 02/01/2021 0945   K 3.9 01/04/2017 0902   CL 108 02/01/2021 0945   CO2 27 02/01/2021 0945   CO2 28 01/04/2017 0902   BUN 16 02/01/2021 0945   BUN 9.5 01/04/2017 0902   CREATININE 0.74 02/01/2021 0945   CREATININE 0.80 12/18/2018 0901   CREATININE 0.7 01/04/2017 0902      Component Value Date/Time   CALCIUM 9.8 02/01/2021 0945   CALCIUM 9.0 01/04/2017 0902   ALKPHOS 70 02/01/2021 0945   ALKPHOS 101 01/04/2017 0902   AST 11 (L) 02/01/2021 0945   AST 18 12/18/2018 0901   AST 21 01/04/2017 0902   ALT 9 02/01/2021 0945   ALT 14 12/18/2018 0901   ALT 16 01/04/2017 0902   BILITOT 0.3 02/01/2021 0945   BILITOT 0.3 12/18/2018 0901   BILITOT 0.56 01/04/2017 0902

## 2021-02-02 LAB — KAPPA/LAMBDA LIGHT CHAINS
Kappa free light chain: 14.8 mg/L (ref 3.3–19.4)
Kappa, lambda light chain ratio: 0.17 — ABNORMAL LOW (ref 0.26–1.65)
Lambda free light chains: 89.2 mg/L — ABNORMAL HIGH (ref 5.7–26.3)

## 2021-02-05 LAB — MULTIPLE MYELOMA PANEL, SERUM
Albumin SerPl Elph-Mcnc: 3.3 g/dL (ref 2.9–4.4)
Albumin/Glob SerPl: 1.3 (ref 0.7–1.7)
Alpha 1: 0.2 g/dL (ref 0.0–0.4)
Alpha2 Glob SerPl Elph-Mcnc: 0.9 g/dL (ref 0.4–1.0)
B-Globulin SerPl Elph-Mcnc: 0.8 g/dL (ref 0.7–1.3)
Gamma Glob SerPl Elph-Mcnc: 0.7 g/dL (ref 0.4–1.8)
Globulin, Total: 2.6 g/dL (ref 2.2–3.9)
IgA: 370 mg/dL (ref 64–422)
IgG (Immunoglobin G), Serum: 449 mg/dL — ABNORMAL LOW (ref 586–1602)
IgM (Immunoglobulin M), Srm: 44 mg/dL (ref 26–217)
M Protein SerPl Elph-Mcnc: 0.3 g/dL — ABNORMAL HIGH
Total Protein ELP: 5.9 g/dL — ABNORMAL LOW (ref 6.0–8.5)

## 2021-02-07 ENCOUNTER — Other Ambulatory Visit: Payer: Self-pay

## 2021-02-07 ENCOUNTER — Telehealth: Payer: Self-pay

## 2021-02-07 MED ORDER — POMALIDOMIDE 2 MG PO CAPS: ORAL_CAPSULE | ORAL | 0 refills | Status: DC

## 2021-02-07 NOTE — Telephone Encounter (Signed)
Called and given below message. She verbalized understanding. 

## 2021-02-07 NOTE — Telephone Encounter (Signed)
-----   Message from [name], MD sent at 02/07/2021  8:45 AM EDT ----- M protein is 0.3, per recent discussion, no change in Rx

## 2021-02-13 ENCOUNTER — Other Ambulatory Visit: Payer: Self-pay | Admitting: Hematology and Oncology

## 2021-02-14 ENCOUNTER — Encounter: Payer: Self-pay | Admitting: Hematology and Oncology

## 2021-03-01 ENCOUNTER — Encounter: Payer: Self-pay | Admitting: Hematology and Oncology

## 2021-03-01 ENCOUNTER — Inpatient Hospital Stay: Payer: Medicare HMO

## 2021-03-01 ENCOUNTER — Inpatient Hospital Stay: Payer: Medicare HMO | Admitting: Hematology and Oncology

## 2021-03-01 ENCOUNTER — Other Ambulatory Visit: Payer: Self-pay | Admitting: *Deleted

## 2021-03-01 ENCOUNTER — Inpatient Hospital Stay: Payer: Medicare HMO | Attending: Hematology and Oncology

## 2021-03-01 ENCOUNTER — Other Ambulatory Visit: Payer: Self-pay

## 2021-03-01 VITALS — BP 114/57 | HR 66 | Temp 98.9°F | Resp 18

## 2021-03-01 VITALS — BP 126/70 | HR 64 | Temp 97.8°F | Resp 18 | Ht 62.0 in | Wt 160.4 lb

## 2021-03-01 DIAGNOSIS — R5381 Other malaise: Secondary | ICD-10-CM

## 2021-03-01 DIAGNOSIS — R5383 Other fatigue: Secondary | ICD-10-CM | POA: Diagnosis not present

## 2021-03-01 DIAGNOSIS — C9000 Multiple myeloma not having achieved remission: Secondary | ICD-10-CM

## 2021-03-01 DIAGNOSIS — C9002 Multiple myeloma in relapse: Secondary | ICD-10-CM

## 2021-03-01 DIAGNOSIS — Z5112 Encounter for antineoplastic immunotherapy: Secondary | ICD-10-CM | POA: Insufficient documentation

## 2021-03-01 DIAGNOSIS — C946 Myelodysplastic disease, not classified: Secondary | ICD-10-CM

## 2021-03-01 DIAGNOSIS — D469 Myelodysplastic syndrome, unspecified: Secondary | ICD-10-CM

## 2021-03-01 LAB — CBC WITH DIFFERENTIAL/PLATELET
Abs Immature Granulocytes: 0.01 10*3/uL (ref 0.00–0.07)
Basophils Absolute: 0 10*3/uL (ref 0.0–0.1)
Basophils Relative: 1 %
Eosinophils Absolute: 0 10*3/uL (ref 0.0–0.5)
Eosinophils Relative: 0 %
HCT: 37 % (ref 36.0–46.0)
Hemoglobin: 12.1 g/dL (ref 12.0–15.0)
Immature Granulocytes: 0 %
Lymphocytes Relative: 46 %
Lymphs Abs: 2.1 10*3/uL (ref 0.7–4.0)
MCH: 34.4 pg — ABNORMAL HIGH (ref 26.0–34.0)
MCHC: 32.7 g/dL (ref 30.0–36.0)
MCV: 105.1 fL — ABNORMAL HIGH (ref 80.0–100.0)
Monocytes Absolute: 0.8 10*3/uL (ref 0.1–1.0)
Monocytes Relative: 17 %
Neutro Abs: 1.7 10*3/uL (ref 1.7–7.7)
Neutrophils Relative %: 36 %
Platelets: 280 10*3/uL (ref 150–400)
RBC: 3.52 MIL/uL — ABNORMAL LOW (ref 3.87–5.11)
RDW: 13.6 % (ref 11.5–15.5)
WBC: 4.6 10*3/uL (ref 4.0–10.5)
nRBC: 0 % (ref 0.0–0.2)

## 2021-03-01 LAB — COMPREHENSIVE METABOLIC PANEL
ALT: 8 U/L (ref 0–44)
AST: 10 U/L — ABNORMAL LOW (ref 15–41)
Albumin: 3.4 g/dL — ABNORMAL LOW (ref 3.5–5.0)
Alkaline Phosphatase: 77 U/L (ref 38–126)
Anion gap: 11 (ref 5–15)
BUN: 14 mg/dL (ref 8–23)
CO2: 26 mmol/L (ref 22–32)
Calcium: 9.9 mg/dL (ref 8.9–10.3)
Chloride: 104 mmol/L (ref 98–111)
Creatinine, Ser: 0.77 mg/dL (ref 0.44–1.00)
GFR, Estimated: >60 mL/min (ref >60)
Glucose, Bld: 95 mg/dL (ref 70–99)
Potassium: 4.3 mmol/L (ref 3.5–5.1)
Sodium: 141 mmol/L (ref 135–145)
Total Bilirubin: 0.3 mg/dL (ref 0.3–1.2)
Total Protein: 6.5 g/dL (ref 6.5–8.1)

## 2021-03-01 LAB — T4, FREE: Free T4: 0.98 ng/dL (ref 0.61–1.12)

## 2021-03-01 LAB — TSH: TSH: 1.034 u[IU]/mL (ref 0.308–3.960)

## 2021-03-01 MED ORDER — SODIUM CHLORIDE 0.9% FLUSH
10.0000 mL | INTRAVENOUS | Status: DC | PRN
  Administered 2021-03-01: 10 mL

## 2021-03-01 MED ORDER — FAMOTIDINE 20 MG IN NS 100 ML IVPB
20.0000 mg | Freq: Once | INTRAVENOUS | Status: AC
  Administered 2021-03-01: 20 mg via INTRAVENOUS
  Filled 2021-03-01: qty 100

## 2021-03-01 MED ORDER — SODIUM CHLORIDE 0.9 % IV SOLN: Freq: Once | INTRAVENOUS | Status: AC

## 2021-03-01 MED ORDER — HEPARIN SOD (PORK) LOCK FLUSH 100 UNIT/ML IV SOLN
500.0000 [IU] | Freq: Once | INTRAVENOUS | Status: AC | PRN
  Administered 2021-03-01: 500 [IU]

## 2021-03-01 MED ORDER — ACETAMINOPHEN 325 MG PO TABS
650.0000 mg | ORAL_TABLET | Freq: Once | ORAL | Status: AC
Start: 2021-03-01 — End: 2021-03-01
  Administered 2021-03-01: 650 mg via ORAL
  Filled 2021-03-01: qty 2

## 2021-03-01 MED ORDER — SODIUM CHLORIDE 0.9 % IV SOLN
16.0000 mg/kg | Freq: Once | INTRAVENOUS | Status: AC
  Administered 2021-03-01: 1200 mg via INTRAVENOUS
  Filled 2021-03-01: qty 60

## 2021-03-01 MED ORDER — SODIUM CHLORIDE 0.9% FLUSH
10.0000 mL | Freq: Once | INTRAVENOUS | Status: AC
  Administered 2021-03-01: 10 mL

## 2021-03-01 NOTE — Assessment & Plan Note (Signed)
She has significant generalized deconditioning She appears motivated to go back to her gym to start an exercise program and I encouraged her to do so

## 2021-03-01 NOTE — Assessment & Plan Note (Signed)
She complained of excessive fatigue I have ordered TSH and it was normal Suspect her fatigue is due to medications and deconditioning

## 2021-03-01 NOTE — Patient Instructions (Signed)
[facility]   Discharge Instructions: Thank you for choosing [facility] to provide your oncology and hematology care.   If you have a lab appointment with the Cancer Center, please go directly to the Cancer Center and check in at the registration area.   Wear comfortable clothing and clothing appropriate for easy access to any Portacath or PICC line.   We strive to give you quality time with your provider. You may need to reschedule your appointment if you arrive late (15 or more minutes).  Arriving late affects you and other patients whose appointments are after yours.  Also, if you miss three or more appointments without notifying the office, you may be dismissed from the clinic at the provider's discretion.      For prescription refill requests, have your pharmacy contact our office and allow 72 hours for refills to be completed.    Today you received the following chemotherapy and/or immunotherapy agents: daratumumab      To help prevent nausea and vomiting after your treatment, we encourage you to take your nausea medication as directed.  BELOW ARE SYMPTOMS THAT SHOULD BE REPORTED IMMEDIATELY: *FEVER GREATER THAN 100.4 F (38 C) OR HIGHER *CHILLS OR SWEATING *NAUSEA AND VOMITING THAT IS NOT CONTROLLED WITH YOUR NAUSEA MEDICATION *UNUSUAL SHORTNESS OF BREATH *UNUSUAL BRUISING OR BLEEDING *URINARY PROBLEMS (pain or burning when urinating, or frequent urination) *BOWEL PROBLEMS (unusual diarrhea, constipation, pain near the anus) TENDERNESS IN MOUTH AND THROAT WITH OR WITHOUT PRESENCE OF ULCERS (sore throat, sores in mouth, or a toothache) UNUSUAL RASH, SWELLING OR PAIN  UNUSUAL VAGINAL DISCHARGE OR ITCHING   Items with * indicate a potential emergency and should be followed up as soon as possible or go to the Emergency Department if any problems should occur.  Please show the CHEMOTHERAPY ALERT CARD or IMMUNOTHERAPY ALERT CARD at check-in  to the Emergency Department and triage nurse.  Should you have questions after your visit or need to cancel or reschedule your appointment, please contact [facility]  Dept: [phone]  and follow the prompts.  Office hours are 8:00 a.m. to 4:30 p.m. Monday - Friday. Please note that voicemails left after 4:00 p.m. may not be returned until the following business day.  We are closed weekends and major holidays. You have access to a nurse at all times for urgent questions. Please call the main number to the clinic Dept: [phone] and follow the prompts.   For any non-urgent questions, you may also contact your provider using MyChart. We now offer e-Visits for anyone 18 and older to request care online for non-urgent symptoms. For details visit [email or web address].   Also download the MyChart app! Go to the app store, search "MyChart", open the app, select [place], and log in with your MyChart username and password.  Due to Covid, a mask is required upon entering the hospital/clinic. If you do not have a mask, one will be given to you upon arrival. For doctor visits, patients may have 1 support person aged 18 or older with them. For treatment visits, patients cannot have anyone with them due to current Covid guidelines and our immunocompromised population.   

## 2021-03-01 NOTE — Assessment & Plan Note (Signed)
I have reviewed her recent myeloma panel Even though her light chains started to trend up, overall, this is minimally increased She has no signs or symptoms of organ damage I think it is reasonable to wait until February for her to get repeat bone marrow aspirate and biopsy and follow-up at [facility] In the meantime, we will continue her current prescribed treatment

## 2021-03-01 NOTE — Progress Notes (Signed)
[facility] OFFICE PROGRESS NOTE  Patient Care Team: [name], [name], MD as PCP - General (Internal Medicine) [name], MD as PCP - Cardiology (Cardiology) [name], MD as Referring Physician (Hematology and Oncology) [name], MD as Consulting Physician (Internal Medicine) [name], [name], MD as Consulting Physician (Infectious Diseases) [name], MD as Consulting Physician (Hematology and Oncology) [name], NP as Nurse Practitioner (Hematology and Oncology) [name], MD as Consulting Physician (Hematology and Oncology)  ASSESSMENT & PLAN:  Multiple myeloma [facility]) I have reviewed her recent myeloma panel Even though her light chains started to trend up, overall, this is minimally increased She has no signs or symptoms of organ damage I think it is reasonable to wait until February for her to get repeat bone marrow aspirate and biopsy and follow-up at [facility] In the meantime, we will continue her current prescribed treatment  Other fatigue She complained of excessive fatigue I have ordered TSH and it was normal Suspect her fatigue is due to medications and deconditioning  Physical deconditioning She has significant generalized deconditioning She appears motivated to go back to her gym to start an exercise program and I encouraged her to do so  Orders Placed This Encounter  Procedures   TSH    Standing Status:   Future    Number of Occurrences:   1    Standing Expiration Date:   03/01/2022   T4, free    Standing Status:   Future    Number of Occurrences:   1    Standing Expiration Date:   03/01/2022    All questions were answered. The patient knows to call the clinic with any problems, questions or concerns. The total time spent in the appointment was 20 minutes encounter with patients including review of chart and various tests results, discussions about plan of care and coordination of care plan    [name], MD 03/01/2021 10:49 AM  INTERVAL HISTORY: Please see below for problem oriented charting. she returns for treatment follow-up with rapid daratumumab, Pomalyst and dexamethasone for recurrent myeloma She complains of generalized fatigue and weakness She has poor appetite but did not lose any weight She has not exercised for many months No recent infection, fever or chills No new bone pain  REVIEW OF SYSTEMS:   Constitutional: Denies fevers, chills or abnormal weight loss Eyes: Denies blurriness of vision Ears, nose, mouth, throat, and face: Denies mucositis or sore throat Respiratory: Denies cough, dyspnea or wheezes Cardiovascular: Denies palpitation, chest discomfort or lower extremity swelling Gastrointestinal:  Denies nausea, heartburn or change in bowel habits Skin: Denies abnormal skin rashes Lymphatics: Denies new lymphadenopathy or easy bruising Neurological:Denies numbness, tingling or new weaknesses Behavioral/Psych: Mood is stable, no new changes  All other systems were reviewed with the patient and are negative.  I have reviewed the past medical history, past surgical history, social history and family history with the patient and they are unchanged from previous note.  ALLERGIES:  has No Known Allergies.  MEDICATIONS:  Current Outpatient Medications  Medication Sig Dispense Refill   acyclovir (ZOVIRAX) 400 MG tablet TAKE 1 TABLET BY MOUTH TWICE A DAY 180 tablet 11   aspirin 325 MG tablet Take 325 mg by mouth daily.     calcipotriene (DOVONOX) 0.005 % cream daily.      calcium carbonate (TUMS - DOSED IN MG ELEMENTAL CALCIUM) 500 MG chewable tablet Chew 1 tablet by mouth daily.  Cholecalciferol 25 MCG (1000 UT) tablet Take 1,000 Units by mouth daily.     DARATUMUMAB IV Inject into the vein every 30 (thirty) days.     loperamide (IMODIUM) 2 MG capsule Take by mouth as needed for diarrhea or loose stools. As needed only     metoprolol tartrate (LOPRESSOR)  25 MG tablet TAKE 1.5 TABLETS (37.5 MG TOTAL) BY MOUTH 2 (TWO) TIMES DAILY. 270 tablet 1   mirtazapine (REMERON) 15 MG tablet Take 1 tablet (15 mg total) by mouth at bedtime. 90 tablet 1   Multiple Vitamin (MULTIVITAMIN WITH MINERALS) TABS tablet Take 1 tablet by mouth daily.     ondansetron (ZOFRAN) 4 MG tablet Take 1 tablet (4 mg total) by mouth every 8 (eight) hours as needed for nausea. 30 tablet 1   pantoprazole (PROTONIX) 40 MG tablet Take 1 tablet (40 mg total) by mouth daily. 180 tablet 2   pomalidomide (POMALYST) 2 MG capsule TAKE 1 CAPSULE DAILY ON DAYS 1 THROUGH 21, THEN 7 DAYS OFF EVERY 28 DAYS 21 capsule 0   No current facility-administered medications for this visit.   Facility-Administered Medications Ordered in Other Visits  Medication Dose Route Frequency Provider Last Rate Last Admin   heparin lock flush 100 unit/mL  500 Units Intracatheter Once PRN Alvy Bimler, Prajna Vanderpool, MD       sodium chloride flush (NS) 0.9 % injection 10 mL  10 mL Intracatheter PRN Alvy Bimler, Alven Alverio, MD        SUMMARY OF ONCOLOGIC HISTORY: Oncology History Overview Note  Multiple myeloma, Ig A Lambda, M spike 3.54 grams, Calcium 9.2, Creatinine 0.8, Beta 2 microglobulin 4.52, IgA 4840 mg/dL, lambda light chain 75.4, albumin 3.6, hemoglobin 9.7, platelet 115    Primary site: Multiple Myeloma   Staging method: AJCC 6th Edition   Clinical: Stage IIA signed by [name], MD on 11/07/2013  2:46 PM   Summary: Stage IIA      Multiple myeloma ([place])  10/31/2013 Bone Marrow Biopsy   Bone marrow biopsy confirmed multiple myeloma with 40% bone marrow involvement. Skeletal survey showed minimal lesions in her score with generalized demineralization   11/10/2013 - 02/13/2014 Chemotherapy   The patient is started on induction chemotherapy with weekly dexamethasone 40 mg by mouth as well as Velcade subcutaneous injection on days 1, 4, 8 and 11. On 11/21/2013, she was started on monthly Zometa.   12/23/2013 Adverse Reaction   The  dose of Velcade was reduced due to thrombocytopenia.   01/28/2014 - 04/07/2014 Chemotherapy   Revlimid is added. Treatment was discontinued due to lack of response.   02/24/2014 - 04/07/2014 Chemotherapy   Due to worsening peripheral neuropathy, Velcade injection is changed to once a week. Revlimid was given 21 days on, 7 days off.   04/07/2014 - 04/10/2014 Chemotherapy   Revlimid was discontinued due to lack of response. Chemotherapy was changed back to Velcade injection twice a week, 2 weeks on 1 week off. Her treatment was switched to to minimum response   04/20/2014 - 06/02/2014 Chemotherapy   chemotherapy is switched to Carfilzomib, Cytoxan and dexamethasone.   04/22/2014 Procedure   she has placement of port for chemotherapy.   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 at [facility]   10/20/2014 - 03/24/2015 Chemotherapy    she received chemotherapy with Kyprolis, Revlimid and dexamethasone   10/22/2014 Procedure   She has port placement   01/19/2015 Tumor Marker   IgA lambda M spike at 0.4 g    01/20/2015 Miscellaneous   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 No cource was found and fever resolved with IV vancomycin and meropenem   09/13/2015 Bone Marrow Biopsy   Bone marrow biopsy showed no increased blasts, 3-4 % plasma cells   03/02/2016 Bone Marrow Biopsy   Bone marrow biopsy at [facility] showed normocellular (30%) bone marrow with trilineage hematopoiesis. No significant increase in blasts. No significant increase in plasma cells.   05/12/2016 Imaging   DEXA scan at [place] showed osteopenia   10/24/2016 Imaging   Skeletal survey at [facility], no new lesions   12/07/2017 Imaging   No focal abnormality noted to suggest myeloma. Exam is stable from prior exam.   03/01/2018 Procedure   Successful 8 French right internal jugular vein power port placement with its tip at the SVC/RA junction.   03/06/2018 -  Chemotherapy   The patient had daratumumab    07/09/2018 Bone Marrow Biopsy   Bone marrow biopsy at [facility] showed residual disease at 0.004% plasma cells   07/03/2019 Bone Marrow Biopsy   A. Bone marrow, flow cytometric analysis for multiple myeloma minimal residual disease detection:   Negative. No phenotypically abnormal plasma cells at or above the limit of detection identified.     No monotypic B-cell population identified. Negative for increased blasts.   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   10/31/2015 Miscellaneous   She has engrafted      PHYSICAL EXAMINATION: ECOG PERFORMANCE STATUS: 1 - Symptomatic but completely ambulatory  Vitals:   03/01/21 0914  BP: 126/70  Pulse: 64  Resp: 18  Temp: 97.8 F (36.6 C)  SpO2: 100%   Filed Weights   03/01/21 0914  Weight: 160 lb 6.4 oz (72.8 kg)    GENERAL:alert, no distress and comfortable SKIN: skin color, texture, turgor are normal, no rashes or significant lesions EYES: normal, Conjunctiva are pink and non-injected, sclera clear OROPHARYNX:no exudate, no erythema and lips, buccal mucosa, and tongue normal  NECK: supple, thyroid normal size, non-tender, without nodularity LYMPH:  no palpable lymphadenopathy in the cervical, axillary or inguinal LUNGS: clear to auscultation and percussion with normal breathing effort HEART: regular  rate & rhythm and no murmurs and no lower extremity edema ABDOMEN:abdomen soft, non-tender and normal bowel sounds Musculoskeletal:no cyanosis of digits and no clubbing  NEURO: alert & oriented x 3 with fluent speech, no focal motor/sensory deficits  LABORATORY DATA:  I have reviewed the data as listed    Component Value Date/Time   NA 141 03/01/2021 0845   NA 142 01/04/2017 0902   K 4.3 03/01/2021 0845   K 3.9 01/04/2017 0902   CL 104 03/01/2021 0845   CO2 26 03/01/2021 0845   CO2 28 01/04/2017 0902   GLUCOSE 95 03/01/2021 0845   GLUCOSE 92 01/04/2017 0902   BUN 14 03/01/2021 0845   BUN 9.5 01/04/2017 0902   CREATININE 0.77 03/01/2021 0845   CREATININE 0.80 12/18/2018 0901   CREATININE 0.7 01/04/2017 0902   CALCIUM 9.9 03/01/2021 0845   CALCIUM 9.0 01/04/2017 0902   PROT 6.5 03/01/2021 0845   PROT 6.0 (L) 01/04/2017 0902   PROT 5.8 (L) 01/04/2017 0902   ALBUMIN 3.4 (L) 03/01/2021 0845   ALBUMIN 3.4 (L) 01/04/2017 0902   AST 10 (L) 03/01/2021 0845   AST 18 12/18/2018 0901   AST 21 01/04/2017 0902   ALT 8 03/01/2021 0845   ALT 14 12/18/2018 0901   ALT 16 01/04/2017 0902   ALKPHOS 77 03/01/2021 0845   ALKPHOS 101  01/04/2017 0902   BILITOT 0.3 03/01/2021 0845   BILITOT 0.3 12/18/2018 0901   BILITOT 0.56 01/04/2017 0902   GFRNONAA >60 03/01/2021 0845   GFRNONAA >60 12/18/2018 0901   GFRAA >60 03/25/2020 0917   GFRAA >60 12/18/2018 0901    No results found for: SPEP, UPEP  Lab Results  Component Value Date   WBC 4.6 03/01/2021   NEUTROABS 1.7 03/01/2021   HGB 12.1 03/01/2021   HCT 37.0 03/01/2021   MCV 105.1 (H) 03/01/2021   PLT 280 03/01/2021      Chemistry      Component Value Date/Time   NA 141 03/01/2021 0845   NA 142 01/04/2017 0902   K 4.3 03/01/2021 0845   K 3.9 01/04/2017 0902   CL 104 03/01/2021 0845   CO2 26 03/01/2021 0845   CO2 28 01/04/2017 0902   BUN 14 03/01/2021 0845   BUN 9.5 01/04/2017 0902   CREATININE 0.77 03/01/2021 0845   CREATININE 0.80 12/18/2018 0901   CREATININE 0.7 01/04/2017 0902      Component Value Date/Time   CALCIUM 9.9 03/01/2021 0845   CALCIUM 9.0 01/04/2017 0902   ALKPHOS 77 03/01/2021 0845   ALKPHOS 101 01/04/2017 0902   AST 10 (L) 03/01/2021 0845   AST 18 12/18/2018 0901   AST 21 01/04/2017 0902   ALT 8 03/01/2021 0845   ALT 14 12/18/2018 0901   ALT 16 01/04/2017 0902   BILITOT 0.3 03/01/2021 0845   BILITOT 0.3 12/18/2018 0901   BILITOT 0.56 01/04/2017 0902

## 2021-03-02 LAB — KAPPA/LAMBDA LIGHT CHAINS
Kappa free light chain: 16.3 mg/L (ref 3.3–19.4)
Kappa, lambda light chain ratio: 0.1 — ABNORMAL LOW (ref 0.26–1.65)
Lambda free light chains: 160.5 mg/L — ABNORMAL HIGH (ref 5.7–26.3)

## 2021-03-04 LAB — MULTIPLE MYELOMA PANEL, SERUM
Albumin SerPl Elph-Mcnc: 3.2 g/dL (ref 2.9–4.4)
Albumin/Glob SerPl: 1.2 (ref 0.7–1.7)
Alpha 1: 0.2 g/dL (ref 0.0–0.4)
Alpha2 Glob SerPl Elph-Mcnc: 1 g/dL (ref 0.4–1.0)
B-Globulin SerPl Elph-Mcnc: 0.8 g/dL (ref 0.7–1.3)
Gamma Glob SerPl Elph-Mcnc: 0.9 g/dL (ref 0.4–1.8)
Globulin, Total: 2.9 g/dL (ref 2.2–3.9)
IgA: 451 mg/dL — ABNORMAL HIGH (ref 64–422)
IgG (Immunoglobin G), Serum: 435 mg/dL — ABNORMAL LOW (ref 586–1602)
IgM (Immunoglobulin M), Srm: 48 mg/dL (ref 26–217)
M Protein SerPl Elph-Mcnc: 0.4 g/dL — ABNORMAL HIGH
Total Protein ELP: 6.1 g/dL (ref 6.0–8.5)

## 2021-03-07 ENCOUNTER — Telehealth: Payer: Self-pay

## 2021-03-07 NOTE — Telephone Encounter (Signed)
Called and given below message. She verbalized understanding. She is going to think about it and send a mychart message with her answer.

## 2021-03-07 NOTE — Telephone Encounter (Signed)
-----   Message from [name], MD sent at 03/07/2021  7:36 AM EDT ----- Her M protein a bit higher Per previous discussion, we can increase dose of pomalyst if she is interested I am ok to wait another month

## 2021-03-08 ENCOUNTER — Other Ambulatory Visit: Payer: Self-pay

## 2021-03-08 ENCOUNTER — Encounter: Payer: Self-pay | Admitting: Hematology and Oncology

## 2021-03-08 ENCOUNTER — Other Ambulatory Visit: Payer: Self-pay | Admitting: Hematology and Oncology

## 2021-03-08 ENCOUNTER — Other Ambulatory Visit (HOSPITAL_COMMUNITY): Payer: Self-pay

## 2021-03-08 MED ORDER — POMALIDOMIDE 3 MG PO CAPS: ORAL_CAPSULE | ORAL | 0 refills | Status: DC

## 2021-03-08 MED ORDER — POMALIDOMIDE 3 MG PO CAPS
ORAL_CAPSULE | ORAL | 0 refills | Status: DC
Start: 2021-03-08 — End: 2021-03-08

## 2021-03-13 ENCOUNTER — Other Ambulatory Visit: Payer: Self-pay | Admitting: Hematology and Oncology

## 2021-03-14 ENCOUNTER — Other Ambulatory Visit: Payer: Self-pay | Admitting: *Deleted

## 2021-03-14 MED ORDER — POMALIDOMIDE 2 MG PO CAPS: ORAL_CAPSULE | ORAL | 0 refills | Status: DC

## 2021-03-14 NOTE — Telephone Encounter (Signed)
Please refill pt electronically

## 2021-03-16 ENCOUNTER — Telehealth: Payer: Self-pay

## 2021-03-16 NOTE — Addendum Note (Signed)
Addended by: Hoy Register D on: 03/16/2021 02:14 PM   Modules accepted: Orders

## 2021-03-16 NOTE — Telephone Encounter (Signed)
Received phone call from [facility] for verification of strength of Pomalyst. Verified that Patient was changed to 3mg  on 9/13. Medication will be shipped to Patient on 9/22.

## 2021-03-18 ENCOUNTER — Encounter: Payer: Self-pay | Admitting: Hematology and Oncology

## 2021-03-21 ENCOUNTER — Encounter: Payer: Self-pay | Admitting: Hematology and Oncology

## 2021-03-29 ENCOUNTER — Inpatient Hospital Stay: Payer: Medicare HMO | Attending: Hematology and Oncology | Admitting: Hematology and Oncology

## 2021-03-29 ENCOUNTER — Inpatient Hospital Stay: Payer: Medicare HMO

## 2021-03-29 ENCOUNTER — Other Ambulatory Visit: Payer: Self-pay

## 2021-03-29 ENCOUNTER — Encounter: Payer: Self-pay | Admitting: Hematology and Oncology

## 2021-03-29 VITALS — BP 121/60 | HR 70 | Temp 98.5°F | Resp 16

## 2021-03-29 DIAGNOSIS — R5081 Fever presenting with conditions classified elsewhere: Secondary | ICD-10-CM | POA: Diagnosis not present

## 2021-03-29 DIAGNOSIS — C9002 Multiple myeloma in relapse: Secondary | ICD-10-CM

## 2021-03-29 DIAGNOSIS — D61818 Other pancytopenia: Secondary | ICD-10-CM | POA: Diagnosis not present

## 2021-03-29 DIAGNOSIS — C9000 Multiple myeloma not having achieved remission: Secondary | ICD-10-CM | POA: Diagnosis not present

## 2021-03-29 DIAGNOSIS — Z5112 Encounter for antineoplastic immunotherapy: Secondary | ICD-10-CM | POA: Diagnosis not present

## 2021-03-29 DIAGNOSIS — D709 Neutropenia, unspecified: Secondary | ICD-10-CM | POA: Diagnosis not present

## 2021-03-29 DIAGNOSIS — Z23 Encounter for immunization: Secondary | ICD-10-CM | POA: Diagnosis not present

## 2021-03-29 DIAGNOSIS — D469 Myelodysplastic syndrome, unspecified: Secondary | ICD-10-CM

## 2021-03-29 MED ORDER — ACETAMINOPHEN 325 MG PO TABS
650.0000 mg | ORAL_TABLET | Freq: Once | ORAL | Status: AC
  Administered 2021-03-29: 650 mg via ORAL
  Filled 2021-03-29: qty 2

## 2021-03-29 MED ORDER — LOPERAMIDE HCL 2 MG PO CAPS
4.0000 mg | ORAL_CAPSULE | Freq: Once | ORAL | Status: AC
  Administered 2021-03-29: 4 mg via ORAL
  Filled 2021-03-29 (×2): qty 2

## 2021-03-29 MED ORDER — SODIUM CHLORIDE 0.9% FLUSH
10.0000 mL | INTRAVENOUS | Status: DC | PRN
  Administered 2021-03-29: 10 mL

## 2021-03-29 MED ORDER — SODIUM CHLORIDE 0.9 % IV SOLN
16.0000 mg/kg | Freq: Once | INTRAVENOUS | Status: AC
  Administered 2021-03-29: 1200 mg via INTRAVENOUS
  Filled 2021-03-29: qty 60

## 2021-03-29 MED ORDER — HEPARIN SOD (PORK) LOCK FLUSH 100 UNIT/ML IV SOLN
500.0000 [IU] | Freq: Once | INTRAVENOUS | Status: AC | PRN
Start: 2021-03-29 — End: 2021-03-29
  Administered 2021-03-29: 500 [IU]

## 2021-03-29 MED ORDER — FAMOTIDINE 20 MG IN NS 100 ML IVPB
20.0000 mg | Freq: Once | INTRAVENOUS | Status: AC
  Administered 2021-03-29: 20 mg via INTRAVENOUS
  Filled 2021-03-29: qty 100

## 2021-03-29 MED ORDER — SODIUM CHLORIDE 0.9 % IV SOLN: Freq: Once | INTRAVENOUS | Status: AC

## 2021-03-29 NOTE — Assessment & Plan Note (Signed)
I have reviewed multiple test results from [facility] last week Bone marrow biopsy results are pending She has appointment to see her oncologist on October 19 She had numerous questions about next step The patient was told she is not a candidate for CAR-T cell, donor lymphocyte infusion due to the health of her donor, etc. I recommend the patient to focus on her next appointment I will make adjustment to her future appointment if needed She will continue her daratumumab infusion as scheduled 

## 2021-03-29 NOTE — Patient Instructions (Signed)
[facility] MEDICAL [facility]COLOGY   Discharge Instructions: Thank you for choosing [facility] to provide your oncology and hematology care.   If you have a lab appointment with the Cancer Center, please go directly to the Cancer Center and check in at the registration area.   Wear comfortable clothing and clothing appropriate for easy access to any Portacath or PICC line.   We strive to give you quality time with your provider. You may need to reschedule your appointment if you arrive late (15 or more minutes).  Arriving late affects you and other patients whose appointments are after yours.  Also, if you miss three or more appointments without notifying the office, you may be dismissed from the clinic at the provider's discretion.      For prescription refill requests, have your pharmacy contact our office and allow 72 hours for refills to be completed.    Today you received the following chemotherapy and/or immunotherapy agents: daratumumab      To help prevent nausea and vomiting after your treatment, we encourage you to take your nausea medication as directed.  BELOW ARE SYMPTOMS THAT SHOULD BE REPORTED IMMEDIATELY: *FEVER GREATER THAN 100.4 F (38 C) OR HIGHER *CHILLS OR SWEATING *NAUSEA AND VOMITING THAT IS NOT CONTROLLED WITH YOUR NAUSEA MEDICATION *UNUSUAL SHORTNESS OF BREATH *UNUSUAL BRUISING OR BLEEDING *URINARY PROBLEMS (pain or burning when urinating, or frequent urination) *BOWEL PROBLEMS (unusual diarrhea, constipation, pain near the anus) TENDERNESS IN MOUTH AND THROAT WITH OR WITHOUT PRESENCE OF ULCERS (sore throat, sores in mouth, or a toothache) UNUSUAL RASH, SWELLING OR PAIN  UNUSUAL VAGINAL DISCHARGE OR ITCHING   Items with * indicate a potential emergency and should be followed up as soon as possible or go to the Emergency Department if any problems should occur.  Please show the CHEMOTHERAPY ALERT CARD or IMMUNOTHERAPY ALERT CARD at check-in  to the Emergency Department and triage nurse.  Should you have questions after your visit or need to cancel or reschedule your appointment, please contact [facility]  Dept: [phone]  and follow the prompts.  Office hours are 8:00 a.m. to 4:30 p.m. Monday - Friday. Please note that voicemails left after 4:00 p.m. may not be returned until the following business day.  We are closed weekends and major holidays. You have access to a nurse at all times for urgent questions. Please call the main number to the clinic Dept: [phone] and follow the prompts.   For any non-urgent questions, you may also contact your provider using MyChart. We now offer e-Visits for anyone 18 and older to request care online for non-urgent symptoms. For details visit [email or web address].   Also download the MyChart app! Go to the app store, search "MyChart", open the app, select [facility], and log in with your MyChart username and password.  Due to Covid, a mask is required upon entering the hospital/clinic. If you do not have a mask, one will be given to you upon arrival. For doctor visits, patients may have 1 support person aged 18 or older with them. For treatment visits, patients cannot have anyone with them due to current Covid guidelines and our immunocompromised population.   

## 2021-03-29 NOTE — Progress Notes (Signed)
[facility] OFFICE PROGRESS NOTE  Patient Care Team: [name], [name], MD as PCP - General (Internal Medicine) [name], MD as PCP - Cardiology (Cardiology) [name], MD as Referring Physician (Hematology and Oncology) [name], MD as Consulting Physician (Internal Medicine) [name], [name], MD as Consulting Physician (Infectious Diseases) [name], MD as Consulting Physician (Hematology and Oncology) [name], NP as Nurse Practitioner (Hematology and Oncology) [name], MD as Consulting Physician (Hematology and Oncology)  ASSESSMENT & PLAN:  Multiple myeloma [facility]) I have reviewed multiple test results from [place] last week Bone marrow biopsy results are pending She has appointment to see her oncologist on October 19 She had numerous questions about next step The patient was told she is not a candidate for CAR-T cell, donor lymphocyte infusion due to the health of her donor, etc. I recommend the patient to focus on her next appointment I will make adjustment to her future appointment if needed She will continue her daratumumab infusion as scheduled  No orders of the defined types were placed in this encounter.   All questions were answered. The patient knows to call the clinic with any problems, questions or concerns. The total time spent in the appointment was 20 minutes encounter with patients including review of chart and various tests results, discussions about plan of care and coordination of care plan   [name], MD 03/29/2021 10:35 AM  INTERVAL HISTORY: Please see below for problem oriented charting. she returns for treatment follow-up on Pomalyst and daratumumab She went to [place] last week on September 30 and underwent blood test and bone marrow biopsy According to the patient, the bone marrow procedure was quite painful She has no new symptoms otherwise No recent infection, fever or  chills She is very anxious about the next step  REVIEW OF SYSTEMS:   Constitutional: Denies fevers, chills or abnormal weight loss Eyes: Denies blurriness of vision Ears, nose, mouth, throat, and face: Denies mucositis or sore throat Respiratory: Denies cough, dyspnea or wheezes Cardiovascular: Denies palpitation, chest discomfort or lower extremity swelling Gastrointestinal:  Denies nausea, heartburn or change in bowel habits Skin: Denies abnormal skin rashes Lymphatics: Denies new lymphadenopathy or easy bruising Neurological:Denies numbness, tingling or new weaknesses Behavioral/Psych: Mood is stable, no new changes  All other systems were reviewed with the patient and are negative.  I have reviewed the past medical history, past surgical history, social history and family history with the patient and they are unchanged from previous note.  ALLERGIES:  has No Known Allergies.  MEDICATIONS:  Current Outpatient Medications  Medication Sig Dispense Refill   acyclovir (ZOVIRAX) 400 MG tablet TAKE 1 TABLET BY MOUTH TWICE A DAY 180 tablet 11   aspirin 325 MG tablet Take 325 mg by mouth daily.     calcipotriene (DOVONOX) 0.005 % cream daily.      calcium carbonate (TUMS - DOSED IN MG ELEMENTAL CALCIUM) 500 MG chewable tablet Chew 1 tablet by mouth daily.     Cholecalciferol 25 MCG (1000 UT) tablet Take 1,000 Units by mouth daily.     DARATUMUMAB IV Inject into the vein every 30 (thirty) days.     loperamide (IMODIUM) 2 MG capsule Take by mouth as needed for diarrhea or loose stools. As needed only     metoprolol tartrate (LOPRESSOR) 25 MG tablet TAKE 1.5 TABLETS (37.5 MG TOTAL) BY MOUTH 2 (TWO) TIMES DAILY. 270 tablet 1   mirtazapine (REMERON) 15  MG tablet Take 1 tablet (15 mg total) by mouth at bedtime. 90 tablet 1   Multiple Vitamin (MULTIVITAMIN WITH MINERALS) TABS tablet Take 1 tablet by mouth daily.     ondansetron (ZOFRAN) 4 MG tablet Take 1 tablet (4 mg total) by mouth every 8  (eight) hours as needed for nausea. 30 tablet 1   pantoprazole (PROTONIX) 40 MG tablet Take 1 tablet (40 mg total) by mouth daily. 180 tablet 2   pomalidomide (POMALYST) 3 MG capsule TAKE 1 CAPSULE DAILY ON DAYS 1 THROUGH 21, THEN 7 DAYS OFF EVERY 28 DAYSTAKE 1 CAPSULE DAILY ON DAYS 1 THROUGH 21, THEN 7 DAYS OFF EVERY 28 DAYS 21 capsule 0   No current facility-administered medications for this visit.   Facility-Administered Medications Ordered in Other Visits  Medication Dose Route Frequency Provider Last Rate Last Admin   daratumumab (DARZALEX) 1,200 mg in sodium chloride 0.9 % 440 mL chemo infusion  16 mg/kg (Treatment Plan Recorded) Intravenous Once Alvy Bimler, Ni, MD       heparin lock flush 100 unit/mL  500 Units Intracatheter Once PRN Alvy Bimler, Ni, MD       loperamide (IMODIUM A-D) tablet 4 mg  4 mg Oral Once Alvy Bimler, Ni, MD       sodium chloride flush (NS) 0.9 % injection 10 mL  10 mL Intracatheter PRN Alvy Bimler, Ni, MD        SUMMARY OF ONCOLOGIC HISTORY: Oncology History Overview Note  Multiple myeloma, Ig A Lambda, M spike 3.54 grams, Calcium 9.2, Creatinine 0.8, Beta 2 microglobulin 4.52, IgA 4840 mg/dL, lambda light chain 75.4, albumin 3.6, hemoglobin 9.7, platelet 115    Primary site: Multiple Myeloma   Staging method: AJCC 6th Edition   Clinical: Stage IIA signed by [name], MD on 11/07/2013  2:46 PM   Summary: Stage IIA      Multiple myeloma ([place])  10/31/2013 Bone Marrow Biopsy   Bone marrow biopsy confirmed multiple myeloma with 40% bone marrow involvement. Skeletal survey showed minimal lesions in her score with generalized demineralization   11/10/2013 - 02/13/2014 Chemotherapy   The patient is started on induction chemotherapy with weekly dexamethasone 40 mg by mouth as well as Velcade subcutaneous injection on days 1, 4, 8 and 11. On 11/21/2013, she was started on monthly Zometa.   12/23/2013 Adverse Reaction   The dose of Velcade was reduced due to thrombocytopenia.    01/28/2014 - 04/07/2014 Chemotherapy   Revlimid is added. Treatment was discontinued due to lack of response.   02/24/2014 - 04/07/2014 Chemotherapy   Due to worsening peripheral neuropathy, Velcade injection is changed to once a week. Revlimid was given 21 days on, 7 days off.   04/07/2014 - 04/10/2014 Chemotherapy   Revlimid was discontinued due to lack of response. Chemotherapy was changed back to Velcade injection twice a week, 2 weeks on 1 week off. Her treatment was switched to to minimum response   04/20/2014 - 06/02/2014 Chemotherapy   chemotherapy is switched to Carfilzomib, Cytoxan and dexamethasone.   04/22/2014 Procedure   she has placement of port for chemotherapy.   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 at [facility]   10/20/2014 - 03/24/2015 Chemotherapy    she received chemotherapy with Kyprolis, Revlimid and dexamethasone   10/22/2014 Procedure   She has port placement   01/19/2015 Tumor Marker   IgA lambda M spike at 0.4 g    01/20/2015 Miscellaneous   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 No cource was found and fever resolved with IV  vancomycin and meropenem   09/13/2015 Bone Marrow Biopsy   Bone marrow biopsy showed no increased blasts, 3-4 % plasma cells   03/02/2016 Bone Marrow Biopsy   Bone marrow biopsy at [facility] showed normocellular (30%) bone marrow with trilineage hematopoiesis. No significant increase in blasts. No significant increase in plasma cells.   05/12/2016 Imaging   DEXA scan at [place] [facility] showed osteopenia   10/24/2016 Imaging   Skeletal survey at [facility], no new lesions   12/07/2017 Imaging   No focal abnormality noted to suggest myeloma. Exam is stable from prior exam.   03/01/2018 Procedure   Successful 8 French right internal jugular vein power port placement with its tip at the SVC/RA junction.   03/06/2018 -  Chemotherapy   The patient had daratumumab    07/09/2018 Bone Marrow Biopsy   Bone marrow biopsy at [facility] showed residual disease at 0.004% plasma cells   07/03/2019 Bone Marrow Biopsy   A. Bone marrow, flow cytometric analysis for multiple myeloma minimal residual disease detection:   Negative. No phenotypically abnormal plasma cells at or above the limit of detection identified.     No monotypic B-cell population identified. Negative for increased blasts.   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   10/31/2015 Miscellaneous   She has engrafted     PHYSICAL EXAMINATION: ECOG PERFORMANCE STATUS: 0 -  Asymptomatic  Vitals:   03/29/21 0825  BP: (!) 129/51  Pulse: 80  Resp: 18  Temp: 97.7 F (36.5 C)  SpO2: 100%   Filed Weights   03/29/21 0825  Weight: 160 lb 3.2 oz (72.7 kg)    GENERAL:alert, no distress and comfortable  NEURO: alert & oriented x 3 with fluent speech, no focal motor/sensory deficits  LABORATORY DATA:  I have reviewed the data as listed    Component Value Date/Time   NA 141 03/01/2021 0845   NA 142 01/04/2017 0902   K 4.3 03/01/2021 0845   K 3.9 01/04/2017 0902   CL 104 03/01/2021 0845   CO2 26 03/01/2021 0845   CO2 28 01/04/2017 0902   GLUCOSE 95 03/01/2021 0845  GLUCOSE 92 01/04/2017 0902   BUN 14 03/01/2021 0845   BUN 9.5 01/04/2017 0902   CREATININE 0.77 03/01/2021 0845   CREATININE 0.80 12/18/2018 0901   CREATININE 0.7 01/04/2017 0902   CALCIUM 9.9 03/01/2021 0845   CALCIUM 9.0 01/04/2017 0902   PROT 6.5 03/01/2021 0845   PROT 6.0 (L) 01/04/2017 0902   PROT 5.8 (L) 01/04/2017 0902   ALBUMIN 3.4 (L) 03/01/2021 0845   ALBUMIN 3.4 (L) 01/04/2017 0902   AST 10 (L) 03/01/2021 0845   AST 18 12/18/2018 0901   AST 21 01/04/2017 0902   ALT 8 03/01/2021 0845   ALT 14 12/18/2018 0901   ALT 16 01/04/2017 0902   ALKPHOS 77 03/01/2021 0845   ALKPHOS 101 01/04/2017 0902   BILITOT 0.3 03/01/2021 0845   BILITOT 0.3 12/18/2018 0901   BILITOT 0.56 01/04/2017 0902   GFRNONAA >60 03/01/2021 0845   GFRNONAA >60 12/18/2018 0901   GFRAA >60 03/25/2020 0917   GFRAA >60 12/18/2018 0901    No results found for: SPEP, UPEP  Lab Results  Component Value Date   WBC 4.6 03/01/2021   NEUTROABS 1.7 03/01/2021   HGB 12.1 03/01/2021   HCT 37.0 03/01/2021   MCV 105.1 (H) 03/01/2021   PLT 280 03/01/2021      Chemistry      Component Value Date/Time   NA 141 03/01/2021 0845   NA 142 01/04/2017 0902   K 4.3 03/01/2021 0845   K 3.9 01/04/2017 0902   CL 104 03/01/2021 0845   CO2 26 03/01/2021 0845   CO2 28 01/04/2017 0902   BUN 14 03/01/2021 0845    BUN 9.5 01/04/2017 0902   CREATININE 0.77 03/01/2021 0845   CREATININE 0.80 12/18/2018 0901   CREATININE 0.7 01/04/2017 0902      Component Value Date/Time   CALCIUM 9.9 03/01/2021 0845   CALCIUM 9.0 01/04/2017 0902   ALKPHOS 77 03/01/2021 0845   ALKPHOS 101 01/04/2017 0902   AST 10 (L) 03/01/2021 0845   AST 18 12/18/2018 0901   AST 21 01/04/2017 0902   ALT 8 03/01/2021 0845   ALT 14 12/18/2018 0901   ALT 16 01/04/2017 0902   BILITOT 0.3 03/01/2021 0845   BILITOT 0.3 12/18/2018 0901   BILITOT 0.56 01/04/2017 0902

## 2021-03-29 NOTE — Progress Notes (Signed)
Per Dr. Alvy Bimler, ok to use labs drawn from [facility] 9/30.

## 2021-04-01 ENCOUNTER — Other Ambulatory Visit: Payer: Self-pay

## 2021-04-01 MED ORDER — POMALIDOMIDE 3 MG PO CAPS: ORAL_CAPSULE | ORAL | 0 refills | Status: DC

## 2021-04-05 ENCOUNTER — Encounter: Payer: Self-pay | Admitting: Hematology and Oncology

## 2021-04-05 ENCOUNTER — Inpatient Hospital Stay: Payer: Medicare HMO

## 2021-04-05 ENCOUNTER — Inpatient Hospital Stay: Payer: Medicare HMO | Admitting: Hematology and Oncology

## 2021-04-05 ENCOUNTER — Telehealth: Payer: Self-pay

## 2021-04-05 ENCOUNTER — Other Ambulatory Visit: Payer: Self-pay

## 2021-04-05 ENCOUNTER — Ambulatory Visit (HOSPITAL_COMMUNITY)
Admission: RE | Admit: 2021-04-05 | Discharge: 2021-04-05 | Disposition: A | Discharge status: Home / Self Care | Payer: Medicare HMO | Source: Ambulatory Visit | Attending: Hematology and Oncology | Admitting: Hematology and Oncology

## 2021-04-05 VITALS — BP 127/81 | HR 91 | Temp 98.2°F | Resp 18 | Ht 62.0 in | Wt 159.4 lb

## 2021-04-05 DIAGNOSIS — R051 Acute cough: Secondary | ICD-10-CM

## 2021-04-05 DIAGNOSIS — D709 Neutropenia, unspecified: Secondary | ICD-10-CM

## 2021-04-05 DIAGNOSIS — C9000 Multiple myeloma not having achieved remission: Secondary | ICD-10-CM | POA: Diagnosis not present

## 2021-04-05 DIAGNOSIS — Z5112 Encounter for antineoplastic immunotherapy: Secondary | ICD-10-CM | POA: Diagnosis not present

## 2021-04-05 DIAGNOSIS — R509 Fever, unspecified: Secondary | ICD-10-CM

## 2021-04-05 DIAGNOSIS — R5081 Fever presenting with conditions classified elsewhere: Secondary | ICD-10-CM

## 2021-04-05 DIAGNOSIS — D61818 Other pancytopenia: Secondary | ICD-10-CM

## 2021-04-05 LAB — COMPREHENSIVE METABOLIC PANEL
ALT: 6 U/L (ref 0–44)
AST: 7 U/L — ABNORMAL LOW (ref 15–41)
Albumin: 3.4 g/dL — ABNORMAL LOW (ref 3.5–5.0)
Alkaline Phosphatase: 79 U/L (ref 38–126)
Anion gap: 11 (ref 5–15)
BUN: 15 mg/dL (ref 8–23)
CO2: 25 mmol/L (ref 22–32)
Calcium: 10.2 mg/dL (ref 8.9–10.3)
Chloride: 105 mmol/L (ref 98–111)
Creatinine, Ser: 0.76 mg/dL (ref 0.44–1.00)
GFR, Estimated: >60 mL/min (ref >60)
Glucose, Bld: 104 mg/dL — ABNORMAL HIGH (ref 70–99)
Potassium: 3.5 mmol/L (ref 3.5–5.1)
Sodium: 141 mmol/L (ref 135–145)
Total Bilirubin: 0.4 mg/dL (ref 0.3–1.2)
Total Protein: 7.1 g/dL (ref 6.5–8.1)

## 2021-04-05 LAB — CBC WITH DIFFERENTIAL/PLATELET
Abs Immature Granulocytes: 0 10*3/uL (ref 0.00–0.07)
Basophils Absolute: 0 10*3/uL (ref 0.0–0.1)
Basophils Relative: 1 %
Eosinophils Absolute: 0 10*3/uL (ref 0.0–0.5)
Eosinophils Relative: 0 %
HCT: 33.3 % — ABNORMAL LOW (ref 36.0–46.0)
Hemoglobin: 10.7 g/dL — ABNORMAL LOW (ref 12.0–15.0)
Immature Granulocytes: 0 %
Lymphocytes Relative: 48 %
Lymphs Abs: 1.4 10*3/uL (ref 0.7–4.0)
MCH: 33.5 pg (ref 26.0–34.0)
MCHC: 32.1 g/dL (ref 30.0–36.0)
MCV: 104.4 fL — ABNORMAL HIGH (ref 80.0–100.0)
Monocytes Absolute: 0.4 10*3/uL (ref 0.1–1.0)
Monocytes Relative: 14 %
Neutro Abs: 1.1 10*3/uL — ABNORMAL LOW (ref 1.7–7.7)
Neutrophils Relative %: 37 %
Platelets: 158 10*3/uL (ref 150–400)
RBC: 3.19 MIL/uL — ABNORMAL LOW (ref 3.87–5.11)
RDW: 13.6 % (ref 11.5–15.5)
WBC: 2.9 10*3/uL — ABNORMAL LOW (ref 4.0–10.5)
nRBC: 0 % (ref 0.0–0.2)

## 2021-04-05 MED ORDER — AMOXICILLIN-POT CLAVULANATE 875-125 MG PO TABS: 1.0000 | ORAL_TABLET | Freq: Two times a day (BID) | ORAL | 0 refills | Status: DC

## 2021-04-05 NOTE — Progress Notes (Signed)
[facility] OFF[facility] PROGRESS NOTE  Patient Care Team: [name], [name], MD as PCP - General (Internal Medicine) [name], MD as PCP - Cardiology (Cardiology) [name], MD as Referring Physician (Hematology and Oncology) [name], MD as Consulting Physician (Internal Medicine) [name], [name], MD as Consulting Physician (Infectious Diseases) [name], MD as Consulting Physician (Hematology and Oncology) [name], NP as Nurse Practitioner (Hematology and Oncology) [name], MD as Consulting Physician (Hematology and Oncology)  ASSESSMENT & PLAN:  Multiple myeloma [facility]) I have briefly review her bone marrow result from [place] She has appointment to see her transplant physician/medical oncologist at the end of the month to review treatment recommendation Due to neutropenic fever, Pomalyst is placed on hold We will call her at the end of the week to check on her She will start her 7-day break today and resume next week once her symptoms has resolved I will see her next month for further follow-up as scheduled  Neutropenic fever ([place]) Technically, she has neutropenic fever based on her blood work and clinical findings I gave her instruction to hold Pomalyst Blood cultures are pending She was not able to give urine sample but she has no urinary symptoms such as dysuria, frequency or urgency I have reviewed her chest x-ray which show no evidence of pulmonary infiltrate I recommend a course of antibiotics with Augmentin The risk, benefits, side effects of treatment were discussed and she is in agreement to proceed  Pancytopenia, acquired [facility]) She has significant changes to her blood count likely due to higher dose of Pomalyst As above, she will hold her treatment for 1 week She is somewhat symptomatic from the anemia but hopefully her symptoms will improve in the near future  Orders Placed This Encounter   Procedures   Culture, blood (single) w Reflex to ID Panel    Standing Status:   Future    Number of Occurrences:   1    Standing Expiration Date:   04/05/2022   DG Chest 2 View    Standing Status:   Future    Number of Occurrences:   1    Standing Expiration Date:   04/05/2022    Order Specific Question:   Reason for Exam (SYMPTOM  OR DIAGNOSIS REQUIRED)    Answer:   on chemo, cough, low grade fevers    Order Specific Question:   Preferred imaging location?    Answer:   [facility]   Comprehensive metabolic panel    Standing Status:   Future    Number of Occurrences:   1    Standing Expiration Date:   04/05/2022   CBC with Differential/Platelet    Standing Status:   Future    Number of Occurrences:   1    Standing Expiration Date:   04/05/2022    All questions were answered. The patient knows to call the clinic with any problems, questions or concerns. The total time spent in the appointment was 30 minutes encounter with patients including review of chart and various tests results, discussions about plan of care and coordination of care plan   [name], MD 04/05/2021 4:21 PM  INTERVAL HISTORY: Please see below for problem oriented charting. she returns for urgent evaluation The patient has not been feeling well over the past week She had daily night sweats for 4 days She also had low-grade fever about 100.4 but nothing higher She complain of heartburn sensation  Denies sore throat, cough or changes in bowel habits Denies urinary symptoms such as urinary frequency, urgency or dysuria  REVIEW OF SYSTEMS:   Eyes: Denies blurriness of vision Ears, nose, mouth, throat, and face: Denies mucositis or sore throat Respiratory: Denies cough, dyspnea or wheezes Cardiovascular: Denies palpitation, chest discomfort or lower extremity swelling Skin: Denies abnormal skin rashes Lymphatics: Denies new lymphadenopathy or easy bruising Neurological:Denies numbness, tingling or  new weaknesses Behavioral/Psych: Mood is stable, no new changes  All other systems were reviewed with the patient and are negative.  I have reviewed the past medical history, past surgical history, social history and family history with the patient and they are unchanged from previous note.  ALLERGIES:  has No Known Allergies.  MEDICATIONS:  Current Outpatient Medications  Medication Sig Dispense Refill   amoxicillin-clavulanate (AUGMENTIN) 875-125 MG tablet Take 1 tablet by mouth 2 (two) times daily. 14 tablet 0   acyclovir (ZOVIRAX) 400 MG tablet TAKE 1 TABLET BY MOUTH TWICE A DAY 180 tablet 11   aspirin 325 MG tablet Take 325 mg by mouth daily.     calcipotriene (DOVONOX) 0.005 % cream daily.      calcium carbonate (TUMS - DOSED IN MG ELEMENTAL CALCIUM) 500 MG chewable tablet Chew 1 tablet by mouth daily.     Cholecalciferol 25 MCG (1000 UT) tablet Take 1,000 Units by mouth daily.     DARATUMUMAB IV Inject into the vein every 30 (thirty) days.     loperamide (IMODIUM) 2 MG capsule Take by mouth as needed for diarrhea or loose stools. As needed only     metoprolol tartrate (LOPRESSOR) 25 MG tablet TAKE 1.5 TABLETS (37.5 MG TOTAL) BY MOUTH 2 (TWO) TIMES DAILY. 270 tablet 1   mirtazapine (REMERON) 15 MG tablet Take 1 tablet (15 mg total) by mouth at bedtime. 90 tablet 1   Multiple Vitamin (MULTIVITAMIN WITH MINERALS) TABS tablet Take 1 tablet by mouth daily.     ondansetron (ZOFRAN) 4 MG tablet Take 1 tablet (4 mg total) by mouth every 8 (eight) hours as needed for nausea. 30 tablet 1   pantoprazole (PROTONIX) 40 MG tablet Take 1 tablet (40 mg total) by mouth daily. 180 tablet 2   pomalidomide (POMALYST) 3 MG capsule TAKE 1 CAPSULE DAILY ON DAYS 1 THROUGH 21, THEN 7 DAYS OFF EVERY 28 DAYSTAKE 1 CAPSULE DAILY ON DAYS 1 THROUGH 21, THEN 7 DAYS OFF EVERY 28 DAYS 21 capsule 0   No current facility-administered medications for this visit.    SUMMARY OF ONCOLOGIC HISTORY: Oncology History  Overview Note  Multiple myeloma, Ig A Lambda, M spike 3.54 grams, Calcium 9.2, Creatinine 0.8, Beta 2 microglobulin 4.52, IgA 4840 mg/dL, lambda light chain 75.4, albumin 3.6, hemoglobin 9.7, platelet 115    Primary site: Multiple Myeloma   Staging method: AJCC 6th Edition   Clinical: Stage IIA signed by [name], MD on 11/07/2013  2:46 PM   Summary: Stage IIA      Multiple myeloma ([place])  10/31/2013 Bone Marrow Biopsy   Bone marrow biopsy confirmed multiple myeloma with 40% bone marrow involvement. Skeletal survey showed minimal lesions in her score with generalized demineralization   11/10/2013 - 02/13/2014 Chemotherapy   The patient is started on induction chemotherapy with weekly dexamethasone 40 mg by mouth as well as Velcade subcutaneous injection on days 1, 4, 8 and 11. On 11/21/2013, she was started on monthly Zometa.   12/23/2013 Adverse Reaction   The dose of Velcade was reduced  due to thrombocytopenia.   01/28/2014 - 04/07/2014 Chemotherapy   Revlimid is added. Treatment was discontinued due to lack of response.   02/24/2014 - 04/07/2014 Chemotherapy   Due to worsening peripheral neuropathy, Velcade injection is changed to once a week. Revlimid was given 21 days on, 7 days off.   04/07/2014 - 04/10/2014 Chemotherapy   Revlimid was discontinued due to lack of response. Chemotherapy was changed back to Velcade injection twice a week, 2 weeks on 1 week off. Her treatment was switched to to minimum response   04/20/2014 - 06/02/2014 Chemotherapy   chemotherapy is switched to Carfilzomib, Cytoxan and dexamethasone.   04/22/2014 Procedure   she has placement of port for chemotherapy.   06/01/2014 Tumor Marker   Bloodwork show that she has greater than partial response   06/23/2014 Bone Marrow Biopsy   Bone marrow biopsy show 5-10% residual plasma cells, normal cytogenetics and FISH   07/07/2014 Procedure   She had stem cell collection   07/22/2014 - 07/22/2014 Chemotherapy   She  had high-dose chemotherapy with melphalan   07/23/2014 Bone Marrow Transplant   She had bone marrow transplant in autologous fashion at [facility]   10/20/2014 - 03/24/2015 Chemotherapy    she received chemotherapy with Kyprolis, Revlimid and dexamethasone   10/22/2014 Procedure   She has port placement   01/19/2015 Tumor Marker   IgA lambda M spike at 0.4 g    01/20/2015 Miscellaneous   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 No cource was found and fever resolved with IV vancomycin and meropenem   09/13/2015 Bone Marrow Biopsy   Bone marrow biopsy showed no increased blasts, 3-4 % plasma cells   03/02/2016 Bone Marrow Biopsy   Bone marrow biopsy at [facility] showed normocellular (30%) bone marrow with trilineage hematopoiesis. No significant increase in blasts. No significant increase in plasma cells.   05/12/2016 Imaging   DEXA scan at [place] showed osteopenia   10/24/2016 Imaging   Skeletal survey at [facility], no new lesions   12/07/2017 Imaging   No focal abnormality noted to suggest myeloma. Exam is stable from prior exam.   03/01/2018 Procedure   Successful 8 French right internal jugular vein power port placement with its tip at the SVC/RA junction.   03/06/2018 -  Chemotherapy   The patient had daratumumab    07/09/2018 Bone Marrow Biopsy   Bone marrow biopsy at [facility] showed residual disease at 0.004% plasma cells   07/03/2019 Bone Marrow Biopsy   A. Bone marrow, flow cytometric analysis for multiple myeloma minimal residual disease detection:    Negative. No phenotypically abnormal plasma cells at or above the limit of detection identified.     No monotypic B-cell population identified. Negative for increased blasts.   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   10/31/2015 Miscellaneous   She has engrafted     PHYSICAL EXAMINATION: ECOG PERFORMANCE STATUS: 1 - Symptomatic but completely ambulatory  Vitals:   04/05/21 1205  BP: 127/81  Pulse: 91  Resp: 18  Temp: 98.2 F (36.8 C)  SpO2: 98%   Filed Weights   04/05/21 1205  Weight: 159 lb 6.4 oz (72.3 kg)    GENERAL:alert, no distress and comfortable SKIN: skin color, texture, turgor are normal, no rashes or significant lesions EYES: normal, Conjunctiva are pink and non-injected, sclera clear OROPHARYNX:no exudate, no erythema and lips, buccal mucosa, and tongue normal  NECK: supple, thyroid normal size, non-tender, without nodularity LYMPH:  no palpable lymphadenopathy in the cervical, axillary or inguinal LUNGS: clear to auscultation and percussion with normal breathing effort HEART: regular rate & rhythm and no murmurs and no lower extremity edema ABDOMEN:abdomen soft, non-tender and normal bowel sounds Musculoskeletal:no cyanosis of digits and no clubbing  NEURO: alert & oriented x 3 with fluent speech, no focal motor/sensory deficits  LABORATORY DATA:  I have reviewed  the data as listed    Component Value Date/Time   NA 141 04/05/2021 1240   NA 142 01/04/2017 0902   K 3.5 04/05/2021 1240   K 3.9 01/04/2017 0902   CL 105 04/05/2021 1240   CO2 25 04/05/2021 1240   CO2 28 01/04/2017 0902   GLUCOSE 104 (H) 04/05/2021 1240   GLUCOSE 92 01/04/2017 0902   BUN 15 04/05/2021 1240   BUN 9.5 01/04/2017 0902   CREATININE 0.76 04/05/2021 1240   CREATININE 0.80 12/18/2018 0901   CREATININE 0.7 01/04/2017 0902   CALCIUM 10.2 04/05/2021 1240   CALCIUM 9.0 01/04/2017 0902   PROT 7.1 04/05/2021 1240   PROT 6.0 (L) 01/04/2017 0902   PROT 5.8 (L) 01/04/2017 0902   ALBUMIN 3.4 (L) 04/05/2021 1240   ALBUMIN 3.4 (L) 01/04/2017 0902   AST 7 (L) 04/05/2021 1240   AST 18 12/18/2018 0901   AST 21 01/04/2017 0902   ALT 6 04/05/2021 1240   ALT 14 12/18/2018 0901   ALT 16 01/04/2017 0902   ALKPHOS 79 04/05/2021 1240   ALKPHOS 101 01/04/2017 0902   BILITOT 0.4 04/05/2021 1240   BILITOT 0.3 12/18/2018 0901   BILITOT 0.56 01/04/2017 0902   GFRNONAA >60 04/05/2021 1240   GFRNONAA >60 12/18/2018 0901   GFRAA >60 03/25/2020 0917   GFRAA >60 12/18/2018 0901    No results found for: SPEP, UPEP  Lab Results  Component Value Date   WBC 2.9 (L) 04/05/2021   NEUTROABS 1.1 (L) 04/05/2021   HGB 10.7 (L) 04/05/2021   HCT 33.3 (L) 04/05/2021   MCV 104.4 (H) 04/05/2021   PLT 158 04/05/2021      Chemistry      Component Value Date/Time   NA 141 04/05/2021 1240   NA 142 01/04/2017 0902   K 3.5 04/05/2021 1240   K 3.9 01/04/2017 0902   CL 105 04/05/2021 1240   CO2 25 04/05/2021 1240   CO2 28 01/04/2017 0902   BUN 15 04/05/2021 1240   BUN 9.5 01/04/2017 0902   CREATININE 0.76 04/05/2021 1240   CREATININE 0.80 12/18/2018 0901   CREATININE 0.7 01/04/2017 0902      Component Value  Date/Time   CALCIUM 10.2 04/05/2021 1240   CALCIUM 9.0 01/04/2017 0902   ALKPHOS 79 04/05/2021 1240   ALKPHOS 101 01/04/2017 0902   AST 7 (L) 04/05/2021 1240   AST 18 12/18/2018  0901   AST 21 01/04/2017 0902   ALT 6 04/05/2021 1240   ALT 14 12/18/2018 0901   ALT 16 01/04/2017 0902   BILITOT 0.4 04/05/2021 1240   BILITOT 0.3 12/18/2018 0901   BILITOT 0.56 01/04/2017 0902       RADIOGRAPHIC STUDIES: I have personally reviewed the radiological images as listed and agreed with the findings in the report. DG Chest 2 View  Result Date: 04/05/2021 CLINICAL DATA:  on chemo, cough, low grade fevers EXAM: CHEST - 2 VIEW COMPARISON:  August 14, 2018. FINDINGS: Normal cardiomediastinal silhouette, similar. Similar position of a right IJ approach central venous catheter with the tip projecting at the SVC. No consolidation. No visible pleural effusions or pneumothorax. Stable granuloma in the right lower lung. Partially imaged right reverse total shoulder arthroplasty. Overall, no change in appearance of the chest. IMPRESSION: No evidence of acute cardiopulmonary disease. No change from the prior. Electronically Signed   By: Margaretha Sheffield M.D.   On: 04/05/2021 13:55

## 2021-04-05 NOTE — Telephone Encounter (Signed)
Called regarding mychart message. Added her at 1200 pm today. She is aware of appt.

## 2021-04-05 NOTE — Assessment & Plan Note (Signed)
Technically, she has neutropenic fever based on her blood work and clinical findings I gave her instruction to hold Pomalyst Blood cultures are pending She was not able to give urine sample but she has no urinary symptoms such as dysuria, frequency or urgency I have reviewed her chest x-ray which show no evidence of pulmonary infiltrate I recommend a course of antibiotics with Augmentin The risk, benefits, side effects of treatment were discussed and she is in agreement to proceed

## 2021-04-05 NOTE — Assessment & Plan Note (Signed)
I have briefly review her bone marrow result from [facility] She has appointment to see her transplant physician/medical oncologist at the end of the month to review treatment recommendation Due to neutropenic fever, Pomalyst is placed on hold We will call her at the end of the week to check on her She will start her 7-day break today and resume next week once her symptoms has resolved I will see her next month for further follow-up as scheduled

## 2021-04-05 NOTE — Assessment & Plan Note (Signed)
She has significant changes to her blood count likely due to higher dose of Pomalyst As above, she will hold her treatment for 1 week She is somewhat symptomatic from the anemia but hopefully her symptoms will improve in the near future

## 2021-04-07 ENCOUNTER — Telehealth: Payer: Self-pay

## 2021-04-07 NOTE — Telephone Encounter (Signed)
-----   Message from [name], MD sent at 04/07/2021  8:40 AM EDT ----- Pls call her Cultures are negative Is she feeling better?

## 2021-04-07 NOTE — Telephone Encounter (Signed)
Called and given below message. She verbalized understanding. She is feelign some better. She has a low grade temperature of 99 at times. She will see GI on 11/1 regarding hiatal hernia issues. She appreciated the call. Instructed to call the office back if needed. She verbalized understanding.

## 2021-04-08 ENCOUNTER — Telehealth: Payer: Self-pay | Admitting: Internal Medicine

## 2021-04-08 NOTE — Telephone Encounter (Signed)
Pt called and said she doesn't feel her pantoprazole (on it for 2 years) is working for her anymore.  She wants to know if she needs a higher dosage or to change to a different medicine.  Please call to advise.  Thank you.

## 2021-04-08 NOTE — Telephone Encounter (Signed)
Pt states that she feels that her Pantoprazole is not working anymore. States that she has been on the medication for the last two years and starting about two months ago that her symptoms of heartburn are increasing regardless of what she eats. Pt is wanting to know if she needs a higher dosage or to change to a different medication. Please Advise.

## 2021-04-10 LAB — CULTURE, BLOOD (SINGLE): Culture: NO GROWTH

## 2021-04-11 NOTE — Telephone Encounter (Signed)
She has an appointment November 1 with Ellouise Newer, PA-C, she should keep that but in the meantime she can try to take the pantoprazole before breakfast and before supper.  If that does not work out then we will need to consider changing to a different PPI plus or minus adding something like sucralfate

## 2021-04-11 NOTE — Telephone Encounter (Signed)
Pt given Dr. Carlean Purl recommendations. Pt verbalized understanding with all questions answered.

## 2021-04-14 ENCOUNTER — Telehealth: Payer: Self-pay

## 2021-04-14 ENCOUNTER — Encounter: Payer: Self-pay | Admitting: Hematology and Oncology

## 2021-04-14 NOTE — Telephone Encounter (Signed)
Called and left a message. Appt scheduled with flu vaccine on 10/25. November appt canceled.  Ask her to call the office back if this appt does not work for her.

## 2021-04-14 NOTE — Telephone Encounter (Signed)
Pt called and LVM stating she is going to switch therapies, MD should expect report from [place] about recent visit, as pt will be placed on Car-T list and will need echo + cardiac enzymes tested first. Pt states she thinks she may need another PET scan. Pt requests flu shot when she comes for visit with MD 11/3. Pt asks for Dr Alvy Bimler to look over report from [facility] and call back with any concerns. Forwarded to MD for review. Inj appt added to 11/3 visit for flu vaccine.

## 2021-04-14 NOTE — Telephone Encounter (Signed)
-----   Message from [name], MD sent at 04/14/2021 12:49 PM EDT ----- I can see her Tuesday 10/25 at 2 pm, 45 mins to discuss Virtual or inperson with flu shot Delete appointment in Nov

## 2021-04-17 ENCOUNTER — Other Ambulatory Visit: Payer: Self-pay | Admitting: Hematology and Oncology

## 2021-04-18 ENCOUNTER — Other Ambulatory Visit: Payer: Self-pay | Admitting: Hematology and Oncology

## 2021-04-18 ENCOUNTER — Encounter: Payer: Self-pay | Admitting: Hematology and Oncology

## 2021-04-19 ENCOUNTER — Inpatient Hospital Stay: Payer: Medicare HMO

## 2021-04-19 ENCOUNTER — Other Ambulatory Visit: Payer: Self-pay

## 2021-04-19 ENCOUNTER — Telehealth: Payer: Self-pay

## 2021-04-19 ENCOUNTER — Inpatient Hospital Stay: Payer: Medicare HMO | Admitting: Hematology and Oncology

## 2021-04-19 VITALS — BP 127/63 | HR 101 | Temp 99.7°F | Resp 18 | Ht 62.0 in | Wt 159.4 lb

## 2021-04-19 DIAGNOSIS — C9002 Multiple myeloma in relapse: Secondary | ICD-10-CM | POA: Diagnosis not present

## 2021-04-19 DIAGNOSIS — Z5112 Encounter for antineoplastic immunotherapy: Secondary | ICD-10-CM | POA: Diagnosis not present

## 2021-04-19 DIAGNOSIS — Z23 Encounter for immunization: Secondary | ICD-10-CM

## 2021-04-19 MED ORDER — INFLUENZA VAC A&B SA ADJ QUAD 0.5 ML IM PRSY
0.5000 mL | PREFILLED_SYRINGE | Freq: Once | INTRAMUSCULAR | Status: AC
  Administered 2021-04-19: 0.5 mL via INTRAMUSCULAR
  Filled 2021-04-19: qty 0.5

## 2021-04-19 MED ORDER — ACYCLOVIR 400 MG PO TABS: 400.0000 mg | ORAL_TABLET | Freq: Every day | ORAL | 11 refills | Status: DC

## 2021-04-19 MED ORDER — PROCHLORPERAZINE MALEATE 10 MG PO TABS: 10.0000 mg | ORAL_TABLET | Freq: Four times a day (QID) | ORAL | 1 refills | Status: DC | PRN

## 2021-04-19 MED ORDER — ONDANSETRON HCL 8 MG PO TABS: 8.0000 mg | ORAL_TABLET | Freq: Three times a day (TID) | ORAL | 1 refills | Status: DC | PRN

## 2021-04-19 NOTE — Progress Notes (Signed)
DISCONTINUE ON PATHWAY REGIMEN - Multiple Myeloma and Other Plasma Cell Dyscrasias     A cycle is every 28 days:     Pomalidomide      Dexamethasone      Daratumumab      Dexamethasone      Dexamethasone      Daratumumab      Dexamethasone      Dexamethasone      Daratumumab   **Always confirm dose/schedule in your pharmacy ordering system**  REASON: Disease Progression PRIOR TREATMENT: [id number]: DaraPd (Daratumumab + Pomalidomide + Dexamethasone) Until Progression or Unacceptable Toxicity TREATMENT RESPONSE: Progressive Disease (PD)  START OFF PATHWAY REGIMEN - Multiple Myeloma and Other Plasma Cell Dyscrasias   [id number]:Carfilzomib 20/56 mg/m2 Monotherapy q28 Days:   A cycle is every 28 days:     Carfilzomib      Carfilzomib      Carfilzomib      Carfilzomib   **Always confirm dose/schedule in your pharmacy ordering system**  Patient Characteristics: Multiple Myeloma, Relapsed / Refractory, Second through Fourth Lines of Therapy, Frail or Not a Candidate for Triplet Therapy Disease Classification: Multiple Myeloma R-ISS Staging: III Therapeutic Status: Relapsed Line of Therapy: Fourth Line Intent of Therapy: Non-Curative / Palliative Intent, Discussed with Patient

## 2021-04-19 NOTE — Telephone Encounter (Signed)
Pt scheduled for echo 10/26 at 1000 at [place] per MD. Pt aware, provided address. Pt verbalized thanks and understanding.

## 2021-04-20 ENCOUNTER — Encounter: Payer: Self-pay | Admitting: Hematology and Oncology

## 2021-04-20 ENCOUNTER — Ambulatory Visit (HOSPITAL_BASED_OUTPATIENT_CLINIC_OR_DEPARTMENT_OTHER)
Admission: RE | Admit: 2021-04-20 | Discharge: 2021-04-20 | Disposition: A | Discharge status: Home / Self Care | Payer: Medicare HMO | Source: Ambulatory Visit | Attending: Hematology and Oncology | Admitting: Hematology and Oncology

## 2021-04-20 DIAGNOSIS — Z0189 Encounter for other specified special examinations: Secondary | ICD-10-CM | POA: Diagnosis not present

## 2021-04-20 DIAGNOSIS — C9002 Multiple myeloma in relapse: Secondary | ICD-10-CM | POA: Diagnosis not present

## 2021-04-20 DIAGNOSIS — Z01818 Encounter for other preprocedural examination: Secondary | ICD-10-CM | POA: Insufficient documentation

## 2021-04-20 DIAGNOSIS — I10 Essential (primary) hypertension: Secondary | ICD-10-CM | POA: Insufficient documentation

## 2021-04-20 DIAGNOSIS — Z87891 Personal history of nicotine dependence: Secondary | ICD-10-CM | POA: Diagnosis not present

## 2021-04-20 LAB — ECHOCARDIOGRAM COMPLETE
AR max vel: 2.86 cm2
AV Area VTI: 2.55 cm2
AV Area mean vel: 2.8 cm2
AV Mean grad: 3.5 mmHg
AV Peak grad: 6.6 mmHg
Ao pk vel: 1.29 m/s
Area-P 1/2: 3.76 cm2
Calc EF: 64.9 %
S' Lateral: 2.4 cm
Single Plane A2C EF: 71.7 %
Single Plane A4C EF: 58.7 %

## 2021-04-20 NOTE — Assessment & Plan Note (Addendum)
I have reviewed all test results and documentation from [place] I will arrange for PET CT scan to be scheduled next week We discussed treatment options as outlined from her physician at [facility] I would favor getting her treatment with carfilzomib with possible addition of Cytoxan in the future Per recommendation from [facility], she will get treatment on days 1, 8, 15, rest 22 for cycle of every 28 days along with dexamethasone, starting at 20 mg per metered squared with possible escalation up to 56 mg per metered square Given her past history of pancytopenia, it is not clear to me she cannot tolerate Cytoxan right now I will start her on single agent carfilzomib with dexamethasone for the first cycle 2 and observe the trend of her blood work before adding Cytoxan I will order cardiac enzymes and echocardiogram at baseline before we proceed with carfilzomib and monitor closely  I would favor this approach over selinexor or belantamab mafodotin She has appointment pending to see her physician at [facility] in January We will continue myeloma panel once a month The patient is reminded to take acyclovir for antimicrobial prophylaxis She will also continue calcium and vitamin D supplement I recommend the patient to get dental clearance with consideration to resume Zometa in the near future I will see her prior to day 8 of therapy

## 2021-04-20 NOTE — Progress Notes (Signed)
[place] OFFICE PROGRESS NOTE  Patient Care Team: [name], [name], MD as PCP - General (Internal Medicine) [name], MD as PCP - Cardiology (Cardiology) [name], MD as Referring Physician (Hematology and Oncology) [name], MD as Consulting Physician (Internal Medicine) [name], [name], MD as Consulting Physician (Infectious Diseases) [name], MD as Consulting Physician (Hematology and Oncology) [name], NP as Nurse Practitioner (Hematology and Oncology) [name], MD as Consulting Physician (Hematology and Oncology)  ASSESSMENT & PLAN:  Multiple myeloma in relapse [facility]) I have reviewed all test results and documentation from [place] I will arrange for PET CT scan to be scheduled next week We discussed treatment options as outlined from her physician at [facility] I would favor getting her treatment with carfilzomib with possible addition of Cytoxan in the future Per recommendation from [facility], she will get treatment on days 1, 8, 15, rest 22 for cycle of every 28 days along with dexamethasone, starting at 20 mg per metered squared with possible escalation up to 56 mg per metered square Given her past history of pancytopenia, it is not clear to me she cannot tolerate Cytoxan right now I will start her on single agent carfilzomib with dexamethasone for the first cycle 2 and observe the trend of her blood work before adding Cytoxan I will order cardiac enzymes and echocardiogram at baseline before we proceed with carfilzomib and monitor closely  I would favor this approach over selinexor or belantamab mafodotin She has appointment pending to see her physician at [facility] in January We will continue myeloma panel once a month The patient is reminded to take acyclovir for antimicrobial prophylaxis She will also continue calcium and vitamin D supplement I recommend the patient to get dental clearance with consideration  to resume Zometa in the near future I will see her prior to day 8 of therapy  Orders Placed This Encounter  Procedures   NM PET Image Initial (PI) Whole Body    Standing Status:   Future    Standing Expiration Date:   04/19/2022    Order Specific Question:   If indicated for the ordered procedure, I authorize the administration of a radiopharmaceutical per Radiology protocol    Answer:   Yes    Order Specific Question:   Preferred imaging location?    Answer:   [place] with Differential ([place] Only)    Standing Status:   Standing    Number of Occurrences:   20    Standing Expiration Date:   04/19/2022   CMP ([place] only)    Standing Status:   Standing    Number of Occurrences:   20    Standing Expiration Date:   04/19/2022   Kappa/lambda light chains    Standing Status:   Standing    Number of Occurrences:   22    Standing Expiration Date:   04/19/2022   Multiple Myeloma Panel (SPEP&IFE w/QIG)    Standing Status:   Standing    Number of Occurrences:   22    Standing Expiration Date:   04/19/2022   PHYSICIAN COMMUNICATION ORDER    Dexamethasone 20 mg IV to be given in clinic prior to Kyprolis.   ECHOCARDIOGRAM COMPLETE    Standing Status:   Future    Number of Occurrences:   1    Standing Expiration Date:   04/19/2022    Order Specific Question:   Where should  this test be performed    Answer:   [place]    Order Specific Question:   Perflutren DEFINITY (image enhancing agent) should be administered unless hypersensitivity or allergy exist    Answer:   Administer Perflutren    Order Specific Question:   Reason for exam-Echo    Answer:   Chemo  Z09    All questions were answered. The patient knows to call the clinic with any problems, questions or concerns. The total time spent in the appointment was 55 minutes encounter with patients including review of chart and various tests results, discussions about plan of care and coordination of care  plan   [name], MD 04/20/2021 10:37 AM  INTERVAL HISTORY: Please see below for problem oriented charting. she returns for treatment follow-up for relapsed multiple myeloma I have reviewed over 50 pages of outside records from [place] She returns to discuss treatment options In the meantime, she is not symptomatic.  Denies bone pain  REVIEW OF SYSTEMS:   Constitutional: Denies fevers, chills or abnormal weight loss Eyes: Denies blurriness of vision Ears, nose, mouth, throat, and face: Denies mucositis or sore throat Respiratory: Denies cough, dyspnea or wheezes Cardiovascular: Denies palpitation, chest discomfort or lower extremity swelling Gastrointestinal:  Denies nausea, heartburn or change in bowel habits Skin: Denies abnormal skin rashes Lymphatics: Denies new lymphadenopathy or easy bruising Neurological:Denies numbness, tingling or new weaknesses Behavioral/Psych: Mood is stable, no new changes  All other systems were reviewed with the patient and are negative.  I have reviewed the past medical history, past surgical history, social history and family history with the patient and they are unchanged from previous note.  ALLERGIES:  has No Known Allergies.  MEDICATIONS:  Current Outpatient Medications  Medication Sig Dispense Refill   acyclovir (ZOVIRAX) 400 MG tablet Take 1 tablet (400 mg total) by mouth daily. 30 tablet 11   aspirin 325 MG tablet Take 325 mg by mouth daily.     calcipotriene (DOVONOX) 0.005 % cream daily.      calcium carbonate (TUMS - DOSED IN MG ELEMENTAL CALCIUM) 500 MG chewable tablet Chew 1 tablet by mouth daily.     Cholecalciferol 25 MCG (1000 UT) tablet Take 1,000 Units by mouth daily.     loperamide (IMODIUM) 2 MG capsule Take by mouth as needed for diarrhea or loose stools. As needed only     metoprolol tartrate (LOPRESSOR) 25 MG tablet TAKE 1.5 TABLETS (37.5 MG TOTAL) BY MOUTH 2 (TWO) TIMES DAILY. 270 tablet 1   mirtazapine (REMERON) 15 MG  tablet TAKE 1 TABLET BY MOUTH EVERYDAY AT BEDTIME 90 tablet 1   Multiple Vitamin (MULTIVITAMIN WITH MINERALS) TABS tablet Take 1 tablet by mouth daily.     ondansetron (ZOFRAN) 4 MG tablet Take 1 tablet (4 mg total) by mouth every 8 (eight) hours as needed for nausea. 30 tablet 1   ondansetron (ZOFRAN) 8 MG tablet Take 1 tablet (8 mg total) by mouth every 8 (eight) hours as needed (Nausea or vomiting). 30 tablet 1   pantoprazole (PROTONIX) 40 MG tablet Take 1 tablet (40 mg total) by mouth daily. 180 tablet 2   prochlorperazine (COMPAZINE) 10 MG tablet Take 1 tablet (10 mg total) by mouth every 6 (six) hours as needed (Nausea or vomiting). 30 tablet 1   No current facility-administered medications for this visit.    SUMMARY OF ONCOLOGIC HISTORY: Oncology History Overview Note  Multiple myeloma, Ig A Lambda, M spike 3.54 grams, Calcium 9.2, Creatinine  0.8, Beta 2 microglobulin 4.52, IgA 4840 mg/dL, lambda light chain 75.4, albumin 3.6, hemoglobin 9.7, platelet 115    Primary site: Multiple Myeloma   Staging method: AJCC 6th Edition   Clinical: Stage IIA signed by [name], MD on 11/07/2013  2:46 PM   Summary: Stage IIA S/p Allo transplant at [facility] on revlimid, pomalyst, daratumumab and velcade     Multiple myeloma in relapse ([place])  10/31/2013 Bone Marrow Biopsy   Bone marrow biopsy confirmed multiple myeloma with 40% bone marrow involvement. Skeletal survey showed minimal lesions in her score with generalized demineralization   11/10/2013 - 02/13/2014 Chemotherapy   The patient is started on induction chemotherapy with weekly dexamethasone 40 mg by mouth as well as Velcade subcutaneous injection on days 1, 4, 8 and 11. On 11/21/2013, she was started on monthly Zometa.   12/23/2013 Adverse Reaction   The dose of Velcade was reduced due to thrombocytopenia.   01/28/2014 - 04/07/2014 Chemotherapy   Revlimid is added. Treatment was discontinued due to lack of response.   02/24/2014 -  04/07/2014 Chemotherapy   Due to worsening peripheral neuropathy, Velcade injection is changed to once a week. Revlimid was given 21 days on, 7 days off.   04/07/2014 - 04/10/2014 Chemotherapy   Revlimid was discontinued due to lack of response. Chemotherapy was changed back to Velcade injection twice a week, 2 weeks on 1 week off. Her treatment was switched to to minimum response   04/20/2014 - 06/02/2014 Chemotherapy   chemotherapy is switched to Carfilzomib, Cytoxan and dexamethasone.   04/22/2014 Procedure   she has placement of port for chemotherapy.   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 at [facility]   10/20/2014 - 03/24/2015 Chemotherapy    she received chemotherapy with Kyprolis, Revlimid and dexamethasone   10/22/2014 Procedure   She has port placement   01/19/2015 Tumor Marker   IgA lambda M spike at 0.4 g    01/20/2015 Miscellaneous   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 No cource was found and fever resolved with IV vancomycin and meropenem   09/13/2015 Bone Marrow Biopsy   Bone marrow biopsy showed no increased blasts, 3-4 % plasma cells    03/02/2016 Bone Marrow Biopsy   Bone marrow biopsy at [facility] showed normocellular (30%) bone marrow with trilineage hematopoiesis. No significant increase in blasts. No significant increase in plasma cells.   05/12/2016 Imaging   DEXA scan at [place] showed osteopenia   10/24/2016 Imaging   Skeletal survey at [facility], no new lesions   12/07/2017 Imaging   No focal abnormality noted to suggest myeloma. Exam is stable from prior exam.  03/01/2018 Procedure   Successful 8 French right internal jugular vein power port placement with its tip at the SVC/RA junction.   03/06/2018 -  Chemotherapy   The patient had daratumumab    07/09/2018 Bone Marrow Biopsy   Bone marrow biopsy at [facility] showed residual disease at 0.004% plasma cells   07/03/2019 Bone Marrow Biopsy   A. Bone marrow, flow cytometric analysis for multiple myeloma minimal residual disease detection:   Negative. No phenotypically abnormal plasma cells at or above the limit of detection identified.     No monotypic B-cell population identified. Negative for increased blasts.   03/25/2021 Bone Marrow Biopsy   Repeat bone marrow biopsy at [place] showed female donor karyotype.  5 to 7% plasma cell is seen.  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   10/31/2015 Miscellaneous   She has engrafted     PHYSICAL EXAMINATION: ECOG PERFORMANCE STATUS: 1 - Symptomatic but completely ambulatory  Vitals:   04/19/21 1408  BP: 127/63  Pulse: (!) 101  Resp: 18  Temp: 99.7 F (37.6 C)  SpO2: 100%   Filed Weights   04/19/21 1408  Weight: 159 lb 6.4 oz (72.3 kg)    GENERAL:alert, no distress and comfortable NEURO: alert & oriented x 3 with fluent speech, no focal motor/sensory deficits  LABORATORY DATA:  I have reviewed the data as listed    Component Value Date/Time   NA 141 04/05/2021 1240   NA 142 01/04/2017 0902   K 3.5 04/05/2021 1240   K 3.9 01/04/2017 0902   CL 105 04/05/2021 1240   CO2 25 04/05/2021 1240   CO2 28 01/04/2017 0902   GLUCOSE 104 (H) 04/05/2021 1240   GLUCOSE 92 01/04/2017 0902   BUN 15 04/05/2021 1240   BUN 9.5 01/04/2017 0902   CREATININE 0.76 04/05/2021 1240   CREATININE 0.80 12/18/2018 0901   CREATININE 0.7 01/04/2017 0902   CALCIUM 10.2 04/05/2021 1240   CALCIUM 9.0 01/04/2017 0902   PROT 7.1 04/05/2021 1240   PROT 6.0 (L) 01/04/2017 0902   PROT 5.8 (L) 01/04/2017 0902   ALBUMIN 3.4 (L) 04/05/2021 1240   ALBUMIN 3.4 (L) 01/04/2017 0902   AST 7 (L) 04/05/2021 1240   AST 18 12/18/2018 0901   AST 21 01/04/2017 0902   ALT 6 04/05/2021 1240   ALT 14 12/18/2018 0901   ALT 16 01/04/2017 0902   ALKPHOS 79 04/05/2021 1240   ALKPHOS 101 01/04/2017 0902   BILITOT 0.4 04/05/2021 1240   BILITOT 0.3 12/18/2018 0901   BILITOT 0.56 01/04/2017 0902   GFRNONAA >60 04/05/2021 1240   GFRNONAA >60 12/18/2018 0901  GFRAA >60 03/25/2020 0917   GFRAA >60 12/18/2018 0901    No results found for: SPEP, UPEP  Lab Results  Component Value Date   WBC 2.9 (L) 04/05/2021    NEUTROABS 1.1 (L) 04/05/2021   HGB 10.7 (L) 04/05/2021   HCT 33.3 (L) 04/05/2021   MCV 104.4 (H) 04/05/2021   PLT 158 04/05/2021      Chemistry      Component Value Date/Time   NA 141 04/05/2021 1240   NA 142 01/04/2017 0902   K 3.5 04/05/2021 1240   K 3.9 01/04/2017 0902   CL 105 04/05/2021 1240   CO2 25 04/05/2021 1240   CO2 28 01/04/2017 0902   BUN 15 04/05/2021 1240   BUN 9.5 01/04/2017 0902   CREATININE 0.76 04/05/2021 1240   CREATININE 0.80 12/18/2018 0901   CREATININE 0.7 01/04/2017 0902      Component Value Date/Time   CALCIUM 10.2 04/05/2021 1240   CALCIUM 9.0 01/04/2017 0902   ALKPHOS 79 04/05/2021 1240   ALKPHOS 101 01/04/2017 0902   AST 7 (L) 04/05/2021 1240   AST 18 12/18/2018 0901   AST 21 01/04/2017 0902   ALT 6 04/05/2021 1240   ALT 14 12/18/2018 0901   ALT 16 01/04/2017 0902   BILITOT 0.4 04/05/2021 1240   BILITOT 0.3 12/18/2018 0901   BILITOT 0.56 01/04/2017 0902       RADIOGRAPHIC STUDIES: I have personally reviewed the radiological images as listed and agreed with the findings in the report. DG Chest 2 View  Result Date: 04/05/2021 CLINICAL DATA:  on chemo, cough, low grade fevers EXAM: CHEST - 2 VIEW COMPARISON:  August 14, 2018. FINDINGS: Normal cardiomediastinal silhouette, similar. Similar position of a right IJ approach central venous catheter with the tip projecting at the SVC. No consolidation. No visible pleural effusions or pneumothorax. Stable granuloma in the right lower lung. Partially imaged right reverse total shoulder arthroplasty. Overall, no change in appearance of the chest. IMPRESSION: No evidence of acute cardiopulmonary disease. No change from the prior. Electronically Signed   By: Margaretha Sheffield M.D.   On: 04/05/2021 13:55

## 2021-04-22 NOTE — Progress Notes (Signed)
Pharmacist Chemotherapy Monitoring - Initial Assessment    Anticipated start date: 04/29/21   The following has been reviewed per standard work regarding the patient's treatment regimen: The patient's diagnosis, treatment plan and drug doses, and organ/hematologic function Lab orders and baseline tests specific to treatment regimen  The treatment plan start date, drug sequencing, and pre-medications Prior authorization status  Patient's documented medication list, including drug-drug interaction screen and prescriptions for anti-emetics and supportive care specific to the treatment regimen The drug concentrations, fluid compatibility, administration routes, and timing of the medications to be used The patient's access for treatment and lifetime cumulative dose history, if applicable  The patient's medication allergies and previous infusion related reactions, if applicable   Changes made to treatment plan:  treatment plan date  Follow up needed:  Pending authorization for treatment    Kennith Center, Pharm.D., CPP 04/22/2021@3 :25 PM

## 2021-04-26 ENCOUNTER — Ambulatory Visit: Payer: Medicare HMO | Admitting: Physician Assistant

## 2021-04-26 ENCOUNTER — Encounter: Payer: Self-pay | Admitting: Physician Assistant

## 2021-04-26 VITALS — BP 110/70 | HR 80 | Ht 62.5 in | Wt 154.2 lb

## 2021-04-26 DIAGNOSIS — R194 Change in bowel habit: Secondary | ICD-10-CM | POA: Diagnosis not present

## 2021-04-26 DIAGNOSIS — R12 Heartburn: Secondary | ICD-10-CM | POA: Diagnosis not present

## 2021-04-26 MED ORDER — PANTOPRAZOLE SODIUM 40 MG PO TBEC: 40.0000 mg | DELAYED_RELEASE_TABLET | Freq: Two times a day (BID) | ORAL | 5 refills | Status: DC

## 2021-04-26 NOTE — Patient Instructions (Addendum)
We have sent the following medications to your pharmacy for you to pick up at your convenience: Increase Pantoprazole 40 mg twice daily 30-60 minutes before breakfast and dinner.   Try Align or IBGard for lower digestive issues.   Try FDGard for upper digestive issues.  Follow up as needed.   If you are age 75 or older, your body mass index should be between 23-30. Your Body mass index is 27.76 kg/m. If this is out of the aforementioned range listed, please consider follow up with your Primary Care Provider.  If you are age 70 or younger, your body mass index should be between 19-25. Your Body mass index is 27.76 kg/m. If this is out of the aformentioned range listed, please consider follow up with your Primary Care Provider.   ________________________________________________________  The  GI providers would like to encourage you to use [facility] to communicate with providers for non-urgent requests or questions.  Due to long hold times on the telephone, sending your provider a message by [facility] may be a faster and more efficient way to get a response.  Please allow 48 business hours for a response.  Please remember that this is for non-urgent requests.  _______________________________________________________

## 2021-04-26 NOTE — Progress Notes (Signed)
Chief Complaint: Discussion of medications  HPI:    Jean Davidson is a 75 year old female with a past medical history as listed below including reflux and multiple myeloma, known to Dr. Carlean Purl, who was referred to me by [name], [name], MD for discussion of medications.    11/13/2019 office visit for GERD.  She was further evaluated with upper GI series and EGD as well as screening colonoscopy.    12/31/2019 colonoscopy and EGD.  Colonoscopy with diverticulosis in the sigmoid and descending colon and perianal skin tags as well as internal hemorrhoids.  Repeat recommended 10 years.  EGD with 6 cm hiatal hernia and gastritis.    04/08/2021 patient called and described that her Pantoprazole 40 mg once daily was not working anymore for heartburn/reflux over the past couple of months.  She was advised to increase this to twice daily to see if this helped.    Today, the patient tells me that she has had issues with breakthrough heartburn which causes her "severe pain" underneath her sternum.  Oftentimes she will have to stop eating altogether and completely go on a bland diet for days until it seems to "work itself out".  A lot of times when she is having trouble with this she is also having some diarrhea and other times can have trouble with constipation.  Tells me she is not exactly sure what sets her off.  Does describe drinking Ginger ale on a regular basis but "I used to be a heavy Coke drinker and I do not do that anymore".  Explains that she continues her Pantoprazole 40 mg just once daily.  Very occasionally she will take it twice a day if she continues with issues but has not done this on a regular basis recently. Asks about probiotics.    Tells me she has been rediagnosed with "blood cancer", and is set to undergo chemo for this soon.  Describes that as she has gotten older she has developed more more problems.  Also has trouble with bladder. Lives alone.    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   Multiple myeloma ([facility])    Osteopenia    Pinched nerve    Sinusitis, bacterial 07/27/2015   Spinal stenosis in cervical region    Varicose veins of lower extremities with inflammation     Past Surgical History:  Procedure Laterality Date   BLADDER SUSPENSION  1988/1989   x2   COLONOSCOPY     ESOPHAGOGASTRODUODENOSCOPY     IR IMAGING GUIDED PORT INSERTION  03/01/2018   LIMBAL STEM CELL TRANSPLANT     x2   PORT-A-CATH REMOVAL     SHOULDER SURGERY  Right 04/06/13   right   UPPER GASTROINTESTINAL ENDOSCOPY      Current Outpatient Medications  Medication Sig Dispense Refill   acyclovir (ZOVIRAX) 400 MG tablet Take 1 tablet (400 mg total) by mouth daily. 30 tablet 11   aspirin 325 MG tablet Take 325 mg by mouth daily.     calcium carbonate (TUMS - DOSED IN MG ELEMENTAL CALCIUM) 500 MG chewable tablet Chew 1 tablet by mouth daily.     Cholecalciferol 25 MCG (1000 UT) tablet Take 1,000 Units by mouth daily.     loperamide (IMODIUM) 2 MG capsule Take by mouth as needed for diarrhea or loose stools. As needed only     metoprolol tartrate (LOPRESSOR) 25 MG tablet TAKE 1.5 TABLETS (37.5 MG TOTAL) BY MOUTH 2 (TWO) TIMES DAILY. 270 tablet 1   mirtazapine (REMERON) 15 MG tablet TAKE 1 TABLET BY MOUTH EVERYDAY AT BEDTIME 90 tablet  1   Multiple Vitamin (MULTIVITAMIN WITH MINERALS) TABS tablet Take 1 tablet by mouth daily.     ondansetron (ZOFRAN) 8 MG tablet Take 1 tablet (8 mg total) by mouth every 8 (eight) hours as needed (Nausea or vomiting). 30 tablet 1   pantoprazole (PROTONIX) 40 MG tablet Take 1 tablet (40 mg total) by mouth daily. 180 tablet 2   prochlorperazine (COMPAZINE) 10 MG tablet Take 1 tablet (10 mg total) by mouth every 6 (six) hours as needed (Nausea or vomiting). 30 tablet 1   No current facility-administered medications for this visit.    Allergies as of 04/26/2021   (No Known Allergies)    Family History  Problem Relation Age of Onset   Heart disease Mother    Heart failure Father    Heart disease Father    Throat cancer Father    Skin cancer Father    Diabetes Father    Kidney disease Father    Hypertension Brother    Heart disease Brother        congenital shunt, ?    Colon cancer Paternal Aunt 26   Skin cancer Brother    Stomach cancer Neg Hx    Esophageal cancer Neg Hx    Rectal cancer Neg Hx     Social History   Socioeconomic History   Marital status: Divorced    Spouse name: Not on file   Number of children: Not on file   Years of education: Not on file   Highest education level: Not on file  Occupational History   Not on file  Tobacco Use   Smoking status: Former    Packs/day: 0.50    Years: 4.00    Pack years: 2.00    Types: Cigarettes    Quit date: 06/26/1968    Years since quitting: 52.8   Smokeless tobacco: Never  Vaping Use   Vaping Use: Never used  Substance and Sexual Activity   Alcohol use: Yes    Alcohol/week: 1.0 standard drink    Types: 1 Cans of beer per week    Comment: 1 per week per pt   Drug use: No   Sexual activity: Not on file  Other Topics Concern   Not on file  Social History Narrative   Chauncey Reading is daughter tonnie, stillman will,  full code ( reviewed 38)   Social Determinants of Health   Financial Resource Strain: Not on  file  Food Insecurity: Not on file  Transportation Needs: Not on file  Physical Activity: Not on file  Stress: Not on file  Social Connections: Not on file  Intimate Partner Violence: Not on file    Review of Systems:    Constitutional: No weight loss, fever or chills Cardiovascular: No chest pain  Respiratory: No SOB Gastrointestinal: See HPI and otherwise negative   Physical Exam:  Vital signs: BP 110/70 (BP Location: Left Arm, Patient Position: Sitting, Cuff Size: Normal)   Pulse 80   Ht 5' 2.5" (1.588 m) Comment: height measured without shoes  Wt 154 lb 4 oz (70 kg)   BMI 27.76 kg/m    Constitutional:   Pleasant Elderly Caucasian female appears to be in NAD, Well developed, Well nourished, alert and cooperative  Respiratory: Respirations even and unlabored. Lungs clear to auscultation bilaterally.   No wheezes, crackles, or rhonchi.  Cardiovascular: Normal S1, S2. No MRG. Regular rate and rhythm. No peripheral edema, cyanosis or pallor.  Gastrointestinal:  Soft, nondistended, mild epigastric ttp, No rebound or guarding. Normal bowel sounds. No appreciable masses or hepatomegaly. Rectal:  Not performed.  Psychiatric:  Demonstrates good judgement and reason without abnormal affect or behaviors.  RELEVANT LABS AND IMAGING: CBC    Component Value Date/Time   WBC 2.9 (L) 04/05/2021 1240   RBC 3.19 (L) 04/05/2021 1240   HGB 10.7 (L) 04/05/2021 1240   HGB 13.2 12/18/2018 0901   HGB 12.5 01/04/2017 0902   HCT 33.3 (L) 04/05/2021 1240   HCT 36.5 01/04/2017 0902   PLT 158 04/05/2021 1240   PLT 211 12/18/2018 0901   PLT 159 01/04/2017 0902   MCV 104.4 (H) 04/05/2021 1240   MCV 103.1 (H) 01/04/2017 0902   MCH 33.5 04/05/2021 1240   MCHC 32.1 04/05/2021 1240   RDW 13.6 04/05/2021 1240   RDW 12.2 01/04/2017 0902   LYMPHSABS 1.4 04/05/2021 1240   LYMPHSABS 2.2 01/04/2017 0902   MONOABS 0.4 04/05/2021 1240   MONOABS 0.3 01/04/2017 0902   EOSABS 0.0 04/05/2021 1240   EOSABS  0.0 01/04/2017 0902   BASOSABS 0.0 04/05/2021 1240   BASOSABS 0.0 01/04/2017 0902    CMP     Component Value Date/Time   NA 141 04/05/2021 1240   NA 142 01/04/2017 0902   K 3.5 04/05/2021 1240   K 3.9 01/04/2017 0902   CL 105 04/05/2021 1240   CO2 25 04/05/2021 1240   CO2 28 01/04/2017 0902   GLUCOSE 104 (H) 04/05/2021 1240   GLUCOSE 92 01/04/2017 0902   BUN 15 04/05/2021 1240   BUN 9.5 01/04/2017 0902   CREATININE 0.76 04/05/2021 1240   CREATININE 0.80 12/18/2018 0901   CREATININE 0.7 01/04/2017 0902   CALCIUM 10.2 04/05/2021 1240   CALCIUM 9.0 01/04/2017 0902   PROT 7.1 04/05/2021 1240   PROT 6.0 (L) 01/04/2017 0902   PROT 5.8 (L) 01/04/2017 0902   ALBUMIN 3.4 (L) 04/05/2021 1240   ALBUMIN 3.4 (L) 01/04/2017 0902   AST 7 (L) 04/05/2021 1240   AST 18 12/18/2018 0901   AST 21 01/04/2017 0902   ALT 6 04/05/2021 1240   ALT 14 12/18/2018 0901   ALT 16 01/04/2017 0902   ALKPHOS 79 04/05/2021 1240   ALKPHOS 101 01/04/2017 0902   BILITOT 0.4 04/05/2021 1240   BILITOT 0.3 12/18/2018 0901   BILITOT 0.56 01/04/2017 0902   GFRNONAA >60 04/05/2021 1240   GFRNONAA >60 12/18/2018 0901   GFRAA >60 03/25/2020 0917   GFRAA >60 12/18/2018 0901    Assessment: 1.  Heartburn: Breakthrough symptoms on Pantoprazole 40 mg daily 2.  Hiatal hernia: 6 cm  hiatal hernia and gastritis on last EGD 12/31/2019  Plan: 1.  Recommend patient increase her Pantoprazole to 40 mg twice daily, 30-60 minutes before breakfast and dinner.  Prescribed #60 with 3 refills. 2.  Patient asked about probiotics.  Discussed either IBgard or Align for her lower gut and FD guard for her upper digestive system. 3.  Patient will call and let us know if she sees no improvement in symptoms over the next 1 to 2 months.  At that point would discuss adding in Carafate. 4.  Patient to follow in clinic with [name] as needed.  Ellouise Newer, PA-C [facility] 04/26/2021, 9:57 AM  Cc: Haywood Pao, MD

## 2021-04-27 ENCOUNTER — Telehealth: Payer: Self-pay

## 2021-04-27 NOTE — Telephone Encounter (Signed)
Mrs. Spiewak called to see if your PET scan had been prior authorized and it had not so I sent a message to [facility] and she is working on it.  It does appear it has been authorized but I wanted to confirm with [place] before calling Mrs. Anwar back.  Please call her tomorrow once confirmed.  Her scan is not until 05/03/2021. Gardiner Rhyme, RN

## 2021-04-28 ENCOUNTER — Inpatient Hospital Stay: Payer: Medicare HMO

## 2021-04-28 ENCOUNTER — Encounter: Payer: Self-pay | Admitting: Hematology and Oncology

## 2021-04-28 ENCOUNTER — Inpatient Hospital Stay: Payer: Medicare HMO | Admitting: Hematology and Oncology

## 2021-04-28 ENCOUNTER — Ambulatory Visit: Payer: Medicare HMO

## 2021-04-28 MED FILL — Dexamethasone Sodium Phosphate Inj 100 MG/10ML: INTRAMUSCULAR | Qty: 2 | Status: AC

## 2021-04-28 NOTE — Telephone Encounter (Signed)
Called and left a message, PET scan is authorized. Ask her to call the office back for questions.

## 2021-04-29 ENCOUNTER — Ambulatory Visit (HOSPITAL_COMMUNITY): Payer: Medicare HMO

## 2021-04-29 ENCOUNTER — Other Ambulatory Visit: Payer: Self-pay | Admitting: Hematology and Oncology

## 2021-04-29 ENCOUNTER — Inpatient Hospital Stay: Payer: Medicare HMO

## 2021-04-29 ENCOUNTER — Other Ambulatory Visit: Payer: Self-pay

## 2021-04-29 ENCOUNTER — Inpatient Hospital Stay: Payer: Medicare HMO | Attending: Hematology and Oncology

## 2021-04-29 VITALS — BP 135/81 | HR 72 | Temp 99.4°F | Resp 17

## 2021-04-29 DIAGNOSIS — C9002 Multiple myeloma in relapse: Secondary | ICD-10-CM | POA: Insufficient documentation

## 2021-04-29 DIAGNOSIS — Z5112 Encounter for antineoplastic immunotherapy: Secondary | ICD-10-CM | POA: Insufficient documentation

## 2021-04-29 DIAGNOSIS — D469 Myelodysplastic syndrome, unspecified: Secondary | ICD-10-CM

## 2021-04-29 LAB — CBC WITH DIFFERENTIAL (CANCER CENTER ONLY)
Abs Immature Granulocytes: 0 10*3/uL (ref 0.00–0.07)
Basophils Absolute: 0 10*3/uL (ref 0.0–0.1)
Basophils Relative: 1 %
Eosinophils Absolute: 0 10*3/uL (ref 0.0–0.5)
Eosinophils Relative: 0 %
HCT: 32.5 % — ABNORMAL LOW (ref 36.0–46.0)
Hemoglobin: 10.2 g/dL — ABNORMAL LOW (ref 12.0–15.0)
Immature Granulocytes: 0 %
Lymphocytes Relative: 52 %
Lymphs Abs: 2 10*3/uL (ref 0.7–4.0)
MCH: 33.1 pg (ref 26.0–34.0)
MCHC: 31.4 g/dL (ref 30.0–36.0)
MCV: 105.5 fL — ABNORMAL HIGH (ref 80.0–100.0)
Monocytes Absolute: 0.7 10*3/uL (ref 0.1–1.0)
Monocytes Relative: 19 %
Neutro Abs: 1.1 10*3/uL — ABNORMAL LOW (ref 1.7–7.7)
Neutrophils Relative %: 28 %
Platelet Count: 246 10*3/uL (ref 150–400)
RBC: 3.08 MIL/uL — ABNORMAL LOW (ref 3.87–5.11)
RDW: 15.4 % (ref 11.5–15.5)
WBC Count: 3.8 10*3/uL — ABNORMAL LOW (ref 4.0–10.5)
nRBC: 0 % (ref 0.0–0.2)

## 2021-04-29 LAB — CMP (CANCER CENTER ONLY)
ALT: 7 U/L (ref 0–44)
AST: 11 U/L — ABNORMAL LOW (ref 15–41)
Albumin: 3.3 g/dL — ABNORMAL LOW (ref 3.5–5.0)
Alkaline Phosphatase: 77 U/L (ref 38–126)
Anion gap: 9 (ref 5–15)
BUN: 15 mg/dL (ref 8–23)
CO2: 26 mmol/L (ref 22–32)
Calcium: 9 mg/dL (ref 8.9–10.3)
Chloride: 106 mmol/L (ref 98–111)
Creatinine: 0.73 mg/dL (ref 0.44–1.00)
GFR, Estimated: >60 mL/min (ref >60)
Glucose, Bld: 94 mg/dL (ref 70–99)
Potassium: 4 mmol/L (ref 3.5–5.1)
Sodium: 141 mmol/L (ref 135–145)
Total Bilirubin: <0.2 mg/dL — ABNORMAL LOW (ref 0.3–1.2)
Total Protein: 6.7 g/dL (ref 6.5–8.1)

## 2021-04-29 LAB — TROPONIN I (HIGH SENSITIVITY): Troponin I (High Sensitivity): 4 ng/L (ref <18)

## 2021-04-29 MED ORDER — SODIUM CHLORIDE 0.9 % IV SOLN
20.0000 mg | Freq: Once | INTRAVENOUS | Status: AC
  Administered 2021-04-29: 20 mg via INTRAVENOUS
  Filled 2021-04-29: qty 20

## 2021-04-29 MED ORDER — SODIUM CHLORIDE 0.9 % IV SOLN: Freq: Once | INTRAVENOUS | Status: AC

## 2021-04-29 MED ORDER — DEXTROSE 5 % IV SOLN
20.0000 mg/m2 | Freq: Once | INTRAVENOUS | Status: AC
  Administered 2021-04-29: 36 mg via INTRAVENOUS
  Filled 2021-04-29: qty 15

## 2021-04-29 MED ORDER — HEPARIN SOD (PORK) LOCK FLUSH 100 UNIT/ML IV SOLN
500.0000 [IU] | Freq: Once | INTRAVENOUS | Status: AC | PRN
  Administered 2021-04-29: 500 [IU]

## 2021-04-29 MED ORDER — SODIUM CHLORIDE 0.9% FLUSH
10.0000 mL | INTRAVENOUS | Status: DC | PRN
  Administered 2021-04-29: 10 mL

## 2021-04-29 MED ORDER — SODIUM CHLORIDE 0.9% FLUSH
10.0000 mL | Freq: Once | INTRAVENOUS | Status: AC
  Administered 2021-04-29: 10 mL

## 2021-04-29 NOTE — Progress Notes (Signed)
Okay to treat with ANC 1.1 per Dr. Alvy Bimler

## 2021-04-29 NOTE — Patient Instructions (Signed)
[facility]  Discharge Instructions: Thank you for choosing [facility] to provide your oncology and hematology care.   If you have a lab appointment with the [facility], please go directly to the [facility] and check in at the registration area.   Wear comfortable clothing and clothing appropriate for easy access to any Portacath or PICC line.   We strive to give you quality time with your provider. You may need to reschedule your appointment if you arrive late (15 or more minutes).  Arriving late affects you and other patients whose appointments are after yours.  Also, if you miss three or more appointments without notifying the office, you may be dismissed from the clinic at the provider's discretion.      For prescription refill requests, have your pharmacy contact our office and allow 72 hours for refills to be completed.    Today you received the following chemotherapy and/or immunotherapy agents: Kyprolis      To help prevent nausea and vomiting after your treatment, we encourage you to take your nausea medication as directed.  BELOW ARE SYMPTOMS THAT SHOULD BE REPORTED IMMEDIATELY: *FEVER GREATER THAN 100.4 F (38 C) OR HIGHER *CHILLS OR SWEATING *NAUSEA AND VOMITING THAT IS NOT CONTROLLED WITH YOUR NAUSEA MEDICATION *UNUSUAL SHORTNESS OF BREATH *UNUSUAL BRUISING OR BLEEDING *URINARY PROBLEMS (pain or burning when urinating, or frequent urination) *BOWEL PROBLEMS (unusual diarrhea, constipation, pain near the anus) TENDERNESS IN MOUTH AND THROAT WITH OR WITHOUT PRESENCE OF ULCERS (sore throat, sores in mouth, or a toothache) UNUSUAL RASH, SWELLING OR PAIN  UNUSUAL VAGINAL DISCHARGE OR ITCHING   Items with * indicate a potential emergency and should be followed up as soon as possible or go to the Emergency Department if any problems should occur.  Please show the CHEMOTHERAPY ALERT CARD or IMMUNOTHERAPY ALERT CARD at check-in to  the Emergency Department and triage nurse.  Should you have questions after your visit or need to cancel or reschedule your appointment, please contact [facility]  Dept: [phone]  and follow the prompts.  Office hours are 8:00 a.m. to 4:30 p.m. Monday - Friday. Please note that voicemails left after 4:00 p.m. may not be returned until the following business day.  We are closed weekends and major holidays. You have access to a nurse at all times for urgent questions. Please call the main number to the clinic Dept: [phone] and follow the prompts.   For any non-urgent questions, you may also contact your provider using MyChart. We now offer e-Visits for anyone 35 and older to request care online for non-urgent symptoms. For details visit [email or web address].   Also download the MyChart app! Go to the app store, search "MyChart", open the app, select [facility], and log in with your MyChart username and password.  Due to Covid, a mask is required upon entering the hospital/clinic. If you do not have a mask, one will be given to you upon arrival. For doctor visits, patients may have 1 support person aged 65 or older with them. For treatment visits, patients cannot have anyone with them due to current Covid guidelines and our immunocompromised population.   Carfilzomib injection What is this medication? CARFILZOMIB (kar FILZ oh mib) targets a specific protein within cancer cells and stops the cancer cells from growing. It is used to treat multiple myeloma. This medicine may be used for other purposes; ask your health care provider or pharmacist if you  COMMON BRAND NAME(S): KYPROLIS What should I tell my care team before I take this medication? They need to know if you have any of these conditions: heart disease history of blood clots irregular heartbeat kidney disease liver disease lung or breathing disease an unusual or allergic reaction  to carfilzomib, or other medicines, foods, dyes, or preservatives pregnant or trying to get pregnant breast-feeding How should I use this medication? This medicine is for injection or infusion into a vein. It is given by a healthcare professional in a hospital or clinic setting. Talk to your pediatrician regarding the use of this medicine in children.Special care may be needed. Overdosage: If you think you have taken too much of this medicine contact apoison control center or emergency room at once. NOTE: This medicine is only for you. Do not share this medicine with others. What if I miss a dose? It is important not to miss your dose. Call your doctor or health careprofessional if you are unable to keep an appointment. What may interact with this medication? Interactions are not expected. This list may not describe all possible interactions. Give your health care provider a list of all the medicines, herbs, non-prescription drugs, or dietary supplements you use. Also tell them if you smoke, drink alcohol, or use illegaldrugs. Some items may interact with your medicine. What should I watch for while using this medication? Your condition will be monitored while you are receiving this medicine. You may need blood work done while you are taking this medicine. Do not become pregnant while taking this medicine or for 6 months after stopping it. Women should inform their health care provider if they wish to become pregnant or think they might be pregnant. Men should not father a child while taking this medicine and for 3 months after stopping it. There is a potential for serious side effects to an unborn child. Talk to your health care provider for more information. Do not breast-feed an infant while taking thismedicine or for 2 weeks after stopping it. Check with your health care provider if you have severe diarrhea, nausea, and vomiting, or if you sweat a lot. The loss of too much body fluid may make  itdangerous for you to take this medicine. You may get drowsy or dizzy. Do not drive, use machinery, or do anything that needs mental alertness until you know how this medicine affects you. Do not stand up or sit up quickly, especially if you are an older patient. Thisreduces the risk of dizzy or fainting spells. What side effects may I notice from receiving this medication? Side effects that you should report to your doctor or health care professionalas soon as possible: allergic reactions like skin rash, itching or hives, swelling of the face, lips, or tongue confusion dizziness feeling faint or lightheaded fever or chills palpitations seizures signs and symptoms of bleeding such as bloody or black, tarry stools; red or dark-brown urine; spitting up blood or brown material that looks like coffee grounds; red spots on the skin; unusual bruising or bleeding including from the eye, gums, or nose signs and symptoms of a blood clot such as breathing problems; changes in vision; chest pain; severe, sudden headache; pain, swelling, warmth in the leg; trouble speaking; sudden numbness or weakness of the face, arm or leg signs and symptoms of kidney injury like trouble passing urine or change in the amount of urine signs and symptoms of liver injury like dark yellow or brown urine; general ill feeling or flu-like symptoms;   dark yellow or brown urine; general ill feeling or flu-like symptoms; light-colored stools; loss of appetite; nausea; right upper belly pain; unusually weak or tired; yellowing of the eyes or skin Side effects that usually do not require medical attention (report to your doctor or health care professional if they continue or are bothersome): back pain cough diarrhea headache muscle cramps trouble sleeping vomiting This list may not describe all possible side effects. Call your doctor for medical advice about side effects. You may report side effects to FDA at 1-800-FDA-1088. Where should I keep my medication? This drug is given in a  hospital or clinic and will not be stored at home. NOTE: This sheet is a summary. It may not cover all possible information. If you have questions about this medicine, talk to your doctor, pharmacist, or health care provider.  2022 Elsevier/Gold Standard (2020-07-22 00:00:00)

## 2021-05-02 LAB — KAPPA/LAMBDA LIGHT CHAINS
Kappa free light chain: 12.6 mg/L (ref 3.3–19.4)
Kappa, lambda light chain ratio: 0.05 — ABNORMAL LOW (ref 0.26–1.65)
Lambda free light chains: 274.6 mg/L — ABNORMAL HIGH (ref 5.7–26.3)

## 2021-05-03 ENCOUNTER — Ambulatory Visit (HOSPITAL_COMMUNITY)
Admission: RE | Admit: 2021-05-03 | Discharge: 2021-05-03 | Disposition: A | Discharge status: Home / Self Care | Payer: Medicare HMO | Source: Ambulatory Visit | Attending: Hematology and Oncology | Admitting: Hematology and Oncology

## 2021-05-03 DIAGNOSIS — C9002 Multiple myeloma in relapse: Secondary | ICD-10-CM | POA: Insufficient documentation

## 2021-05-03 LAB — MULTIPLE MYELOMA PANEL, SERUM
Albumin SerPl Elph-Mcnc: 3.2 g/dL (ref 2.9–4.4)
Albumin/Glob SerPl: 1.1 (ref 0.7–1.7)
Alpha 1: 0.3 g/dL (ref 0.0–0.4)
Alpha2 Glob SerPl Elph-Mcnc: 0.9 g/dL (ref 0.4–1.0)
B-Globulin SerPl Elph-Mcnc: 0.8 g/dL (ref 0.7–1.3)
Gamma Glob SerPl Elph-Mcnc: 1 g/dL (ref 0.4–1.8)
Globulin, Total: 3 g/dL (ref 2.2–3.9)
IgA: 586 mg/dL — ABNORMAL HIGH (ref 64–422)
IgG (Immunoglobin G), Serum: 380 mg/dL — ABNORMAL LOW (ref 586–1602)
IgM (Immunoglobulin M), Srm: 47 mg/dL (ref 26–217)
M Protein SerPl Elph-Mcnc: 0.5 g/dL — ABNORMAL HIGH
Total Protein ELP: 6.2 g/dL (ref 6.0–8.5)

## 2021-05-03 LAB — GLUCOSE, CAPILLARY: Glucose-Capillary: 98 mg/dL (ref 70–99)

## 2021-05-03 MED ORDER — FLUDEOXYGLUCOSE F - 18 (FDG) INJECTION
7.7000 | Freq: Once | INTRAVENOUS | Status: AC | PRN
  Administered 2021-05-03: 7.83 via INTRAVENOUS

## 2021-05-04 ENCOUNTER — Other Ambulatory Visit: Payer: Self-pay | Admitting: Hematology and Oncology

## 2021-05-05 ENCOUNTER — Encounter: Payer: Self-pay | Admitting: Hematology and Oncology

## 2021-05-05 ENCOUNTER — Inpatient Hospital Stay: Payer: Medicare HMO

## 2021-05-05 ENCOUNTER — Other Ambulatory Visit: Payer: Self-pay

## 2021-05-05 ENCOUNTER — Inpatient Hospital Stay: Payer: Medicare HMO | Admitting: Hematology and Oncology

## 2021-05-05 DIAGNOSIS — D469 Myelodysplastic syndrome, unspecified: Secondary | ICD-10-CM

## 2021-05-05 DIAGNOSIS — C9002 Multiple myeloma in relapse: Secondary | ICD-10-CM

## 2021-05-05 DIAGNOSIS — C946 Myelodysplastic disease, not classified: Secondary | ICD-10-CM

## 2021-05-05 DIAGNOSIS — Z5112 Encounter for antineoplastic immunotherapy: Secondary | ICD-10-CM | POA: Diagnosis not present

## 2021-05-05 DIAGNOSIS — D649 Anemia, unspecified: Secondary | ICD-10-CM

## 2021-05-05 LAB — CBC WITH DIFFERENTIAL (CANCER CENTER ONLY)
Abs Immature Granulocytes: 0.01 10*3/uL (ref 0.00–0.07)
Basophils Absolute: 0 10*3/uL (ref 0.0–0.1)
Basophils Relative: 0 %
Eosinophils Absolute: 0 10*3/uL (ref 0.0–0.5)
Eosinophils Relative: 0 %
HCT: 33.5 % — ABNORMAL LOW (ref 36.0–46.0)
Hemoglobin: 10.5 g/dL — ABNORMAL LOW (ref 12.0–15.0)
Immature Granulocytes: 0 %
Lymphocytes Relative: 30 %
Lymphs Abs: 1.8 10*3/uL (ref 0.7–4.0)
MCH: 33.2 pg (ref 26.0–34.0)
MCHC: 31.3 g/dL (ref 30.0–36.0)
MCV: 106 fL — ABNORMAL HIGH (ref 80.0–100.0)
Monocytes Absolute: 0.6 10*3/uL (ref 0.1–1.0)
Monocytes Relative: 10 %
Neutro Abs: 3.6 10*3/uL (ref 1.7–7.7)
Neutrophils Relative %: 60 %
Platelet Count: 285 10*3/uL (ref 150–400)
RBC: 3.16 MIL/uL — ABNORMAL LOW (ref 3.87–5.11)
RDW: 15.2 % (ref 11.5–15.5)
WBC Count: 6 10*3/uL (ref 4.0–10.5)
nRBC: 0 % (ref 0.0–0.2)

## 2021-05-05 LAB — CMP (CANCER CENTER ONLY)
ALT: 8 U/L (ref 0–44)
AST: 12 U/L — ABNORMAL LOW (ref 15–41)
Albumin: 3.2 g/dL — ABNORMAL LOW (ref 3.5–5.0)
Alkaline Phosphatase: 78 U/L (ref 38–126)
Anion gap: 9 (ref 5–15)
BUN: 18 mg/dL (ref 8–23)
CO2: 25 mmol/L (ref 22–32)
Calcium: 8.8 mg/dL — ABNORMAL LOW (ref 8.9–10.3)
Chloride: 106 mmol/L (ref 98–111)
Creatinine: 0.75 mg/dL (ref 0.44–1.00)
GFR, Estimated: >60 mL/min (ref >60)
Glucose, Bld: 129 mg/dL — ABNORMAL HIGH (ref 70–99)
Potassium: 4.1 mmol/L (ref 3.5–5.1)
Sodium: 140 mmol/L (ref 135–145)
Total Bilirubin: <0.2 mg/dL — ABNORMAL LOW (ref 0.3–1.2)
Total Protein: 6.5 g/dL (ref 6.5–8.1)

## 2021-05-05 MED ORDER — SODIUM CHLORIDE 0.9 % IV SOLN: Freq: Once | INTRAVENOUS | Status: AC

## 2021-05-05 MED ORDER — DEXAMETHASONE SODIUM PHOSPHATE 100 MG/10ML IJ SOLN
20.0000 mg | Freq: Once | INTRAMUSCULAR | Status: AC
  Administered 2021-05-05: 20 mg via INTRAVENOUS
  Filled 2021-05-05: qty 20

## 2021-05-05 MED ORDER — SODIUM CHLORIDE 0.9% FLUSH
10.0000 mL | INTRAVENOUS | Status: DC | PRN
  Administered 2021-05-05: 10 mL

## 2021-05-05 MED ORDER — SODIUM CHLORIDE 0.9% FLUSH
10.0000 mL | Freq: Once | INTRAVENOUS | Status: AC
  Administered 2021-05-05: 10 mL

## 2021-05-05 MED ORDER — DEXTROSE 5 % IV SOLN
27.0000 mg/m2 | Freq: Once | INTRAVENOUS | Status: AC
  Administered 2021-05-05: 50 mg via INTRAVENOUS
  Filled 2021-05-05: qty 10

## 2021-05-05 MED ORDER — HEPARIN SOD (PORK) LOCK FLUSH 100 UNIT/ML IV SOLN
500.0000 [IU] | Freq: Once | INTRAVENOUS | Status: AC | PRN
  Administered 2021-05-05: 500 [IU]

## 2021-05-05 NOTE — Patient Instructions (Signed)
[facility]  Discharge Instructions: Thank you for choosing [facility] to provide your oncology and hematology care.   If you have a lab appointment with the Cancer Center, please go directly to the Cancer Center and check in at the registration area.   Wear comfortable clothing and clothing appropriate for easy access to any Portacath or PICC line.   We strive to give you quality time with your provider. You may need to reschedule your appointment if you arrive late (15 or more minutes).  Arriving late affects you and other patients whose appointments are after yours.  Also, if you miss three or more appointments without notifying the office, you may be dismissed from the clinic at the provider's discretion.      For prescription refill requests, have your pharmacy contact our office and allow 72 hours for refills to be completed.    Today you received the following chemotherapy and/or immunotherapy agents: Kyprolis    To help prevent nausea and vomiting after your treatment, we encourage you to take your nausea medication as directed.  BELOW ARE SYMPTOMS THAT SHOULD BE REPORTED IMMEDIATELY: . *FEVER GREATER THAN 100.4 F (38 C) OR HIGHER . *CHILLS OR SWEATING . *NAUSEA AND VOMITING THAT IS NOT CONTROLLED WITH YOUR NAUSEA MEDICATION . *UNUSUAL SHORTNESS OF BREATH . *UNUSUAL BRUISING OR BLEEDING . *URINARY PROBLEMS (pain or burning when urinating, or frequent urination) . *BOWEL PROBLEMS (unusual diarrhea, constipation, pain near the anus) . TENDERNESS IN MOUTH AND THROAT WITH OR WITHOUT PRESENCE OF ULCERS (sore throat, sores in mouth, or a toothache) . UNUSUAL RASH, SWELLING OR PAIN  . UNUSUAL VAGINAL DISCHARGE OR ITCHING   Items with * indicate a potential emergency and should be followed up as soon as possible or go to the Emergency Department if any problems should occur.  Please show the CHEMOTHERAPY ALERT CARD or IMMUNOTHERAPY ALERT  CARD at check-in to the Emergency Department and triage nurse.  Should you have questions after your visit or need to cancel or reschedule your appointment, please contact [facility]  Dept: [phone]  and follow the prompts.  Office hours are 8:00 a.m. to 4:30 p.m. Monday - Friday. Please note that voicemails left after 4:00 p.m. may not be returned until the following business day.  We are closed weekends and major holidays. You have access to a nurse at all times for urgent questions. Please call the main number to the clinic Dept: [phone] and follow the prompts.   For any non-urgent questions, you may also contact your provider using MyChart. We now offer e-Visits for anyone 18 and older to request care online for non-urgent symptoms. For details visit [email or web address].   Also download the MyChart app! Go to the app store, search "MyChart", open the app, select [facility], and log in with your MyChart username and password.  Due to Covid, a mask is required upon entering the hospital/clinic. If you do not have a mask, one will be given to you upon arrival. For doctor visits, patients may have 1 support person aged 18 or older with them. For treatment visits, patients cannot have anyone with them due to current Covid guidelines and our immunocompromised population.   

## 2021-05-05 NOTE — Assessment & Plan Note (Signed)
This is likely due to recent treatment. The patient denies recent history of bleeding such as epistaxis, hematuria or hematochezia. She is asymptomatic from the anemia. I will observe for now.  She does not require transfusion now. I will continue the chemotherapy at current dose without dosage adjustment.  If the anemia gets progressive worse in the future, I might have to delay her treatment or adjust the chemotherapy dose.  

## 2021-05-05 NOTE — Progress Notes (Signed)
[facility] OFFICE PROGRESS NOTE  Patient Care Team: [name], [name], MD as PCP - General (Internal Medicine) Jean Hampshire, MD as PCP - Cardiology (Cardiology) Jean Dibble, MD as Referring Physician (Hematology and Oncology) Jean Lewandowsky, MD as Consulting Physician (Internal Medicine) Jean Davidson, Jean Islam, MD as Consulting Physician (Infectious Diseases) Jean Paganini An, MD as Consulting Physician (Hematology and Oncology) Jean Lowenstein, NP as Nurse Practitioner (Hematology and Oncology) Jean Lark, MD as Consulting Physician (Hematology and Oncology)  ASSESSMENT & PLAN:  Multiple myeloma in relapse [facility]) So far, she tolerated carfilzomib well I have reviewed PET CT imaging with the patient which showed no bone lesions She is wondering when Cytoxan will be added My priority would be to get her through first month of treatment and reassess If she had positive response to treatment, my first goal would be to reduce the dose of dexamethasone However, if response rates are not robust, we can consider adding Cytoxan next She will continue calcium and vitamin D supplement and acyclovir for antimicrobial prophylaxis We will add on Zometa down the road  Normocytic anemia This is likely due to recent treatment. The patient denies recent history of bleeding such as epistaxis, hematuria or hematochezia. She is asymptomatic from the anemia. I will observe for now.  She does not require transfusion now. I will continue the chemotherapy at current dose without dosage adjustment.  If the anemia gets progressive worse in the future, I might have to delay her treatment or adjust the chemotherapy dose.   No orders of the defined types were placed in this encounter.   All questions were answered. The patient knows to call the clinic with any problems, questions or concerns. The total time spent in the appointment was 20 minutes encounter with  patients including review of chart and various tests results, discussions about plan of care and coordination of care plan   Jean Lark, MD 05/05/2021 4:26 PM  INTERVAL HISTORY: Please see below for problem oriented charting. she returns for treatment follow-up seen prior to treatment with carfilzomib today She tolerated treatment well so far No side effects Energy level is fair  REVIEW OF SYSTEMS:   Constitutional: Denies fevers, chills or abnormal weight loss Eyes: Denies blurriness of vision Ears, nose, mouth, throat, and face: Denies mucositis or sore throat Respiratory: Denies cough, dyspnea or wheezes Cardiovascular: Denies palpitation, chest discomfort or lower extremity swelling Gastrointestinal:  Denies nausea, heartburn or change in bowel habits Skin: Denies abnormal skin rashes Lymphatics: Denies new lymphadenopathy or easy bruising Neurological:Denies numbness, tingling or new weaknesses Behavioral/Psych: Mood is stable, no new changes  All other systems were reviewed with the patient and are negative.  I have reviewed the past medical history, past surgical history, social history and family history with the patient and they are unchanged from previous note.  ALLERGIES:  has No Known Allergies.  MEDICATIONS:  Current Outpatient Medications  Medication Sig Dispense Refill   acyclovir (ZOVIRAX) 400 MG tablet Take 1 tablet (400 mg total) by mouth daily. 30 tablet 11   aspirin 325 MG tablet Take 325 mg by mouth daily.     calcium carbonate (TUMS - DOSED IN MG ELEMENTAL CALCIUM) 500 MG chewable tablet Chew 1 tablet by mouth daily.     Carfilzomib (KYPROLIS IV) Inject into the vein once a week.     Cholecalciferol 25 MCG (1000 UT) tablet Take 1,000 Units by mouth daily.     Cyanocobalamin (VITAMIN B12  PO) Take 1 tablet by mouth daily.     loperamide (IMODIUM) 2 MG capsule Take by mouth as needed for diarrhea or loose stools. As needed only     metoprolol tartrate  (LOPRESSOR) 25 MG tablet TAKE 1.5 TABLETS (37.5 MG TOTAL) BY MOUTH 2 (TWO) TIMES DAILY. 270 tablet 1   mirtazapine (REMERON) 15 MG tablet TAKE 1 TABLET BY MOUTH EVERYDAY AT BEDTIME 90 tablet 1   Multiple Vitamin (MULTIVITAMIN WITH MINERALS) TABS tablet Take 1 tablet by mouth daily.     Multiple Vitamins-Minerals (PRESERVISION AREDS 2 PO) Take 1 tablet by mouth 2 (two) times daily.     ondansetron (ZOFRAN) 8 MG tablet Take 1 tablet (8 mg total) by mouth every 8 (eight) hours as needed (Nausea or vomiting). 30 tablet 1   pantoprazole (PROTONIX) 40 MG tablet Take 1 tablet (40 mg total) by mouth 2 (two) times daily before a meal. 60 tablet 5   prochlorperazine (COMPAZINE) 10 MG tablet Take 1 tablet (10 mg total) by mouth every 6 (six) hours as needed (Nausea or vomiting). 30 tablet 1   No current facility-administered medications for this visit.    SUMMARY OF ONCOLOGIC HISTORY: Oncology History Overview Note  Multiple myeloma, Ig A Lambda, M spike 3.54 grams, Calcium 9.2, Creatinine 0.8, Beta 2 microglobulin 4.52, IgA 4840 mg/dL, lambda light chain 75.4, albumin 3.6, hemoglobin 9.7, platelet 115    Primary site: Multiple Myeloma   Staging method: AJCC 6th Edition   Clinical: Stage IIA signed by Jean Lark, MD on 11/07/2013  2:46 PM   Summary: Stage IIA S/p Allo transplant at [facility] on revlimid, pomalyst, daratumumab and velcade     Multiple myeloma in relapse ([facility])  10/31/2013 Bone Marrow Biopsy   Bone marrow biopsy confirmed multiple myeloma with 40% bone marrow involvement. Skeletal survey showed minimal lesions in her score with generalized demineralization   11/10/2013 - 02/13/2014 Chemotherapy   The patient is started on induction chemotherapy with weekly dexamethasone 40 mg by mouth as well as Velcade subcutaneous injection on days 1, 4, 8 and 11. On 11/21/2013, she was started on monthly Zometa.   12/23/2013 Adverse Reaction   The dose of Velcade was reduced due to  thrombocytopenia.   01/28/2014 - 04/07/2014 Chemotherapy   Revlimid is added. Treatment was discontinued due to lack of response.   02/24/2014 - 04/07/2014 Chemotherapy   Due to worsening peripheral neuropathy, Velcade injection is changed to once a week. Revlimid was given 21 days on, 7 days off.   04/07/2014 - 04/10/2014 Chemotherapy   Revlimid was discontinued due to lack of response. Chemotherapy was changed back to Velcade injection twice a week, 2 weeks on 1 week off. Her treatment was switched to to minimum response   04/20/2014 - 06/02/2014 Chemotherapy   chemotherapy is switched to Carfilzomib, Cytoxan and dexamethasone.   04/22/2014 Procedure   she has placement of port for chemotherapy.   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 at [facility]   10/20/2014 - 03/24/2015 Chemotherapy    she received chemotherapy with Kyprolis, Revlimid and dexamethasone   10/22/2014  Procedure   She has port placement   01/19/2015 Tumor Marker   IgA lambda M spike at 0.4 g    01/20/2015 Miscellaneous   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 No cource was found and fever  resolved with IV vancomycin and meropenem   09/13/2015 Bone Marrow Biopsy   Bone marrow biopsy showed no increased blasts, 3-4 % plasma cells   03/02/2016 Bone Marrow Biopsy   Bone marrow biopsy at [facility] showed normocellular (30%) bone marrow with trilineage hematopoiesis. No significant increase in blasts. No significant increase in plasma cells.   05/12/2016 Imaging   DEXA scan at [place] showed osteopenia   10/24/2016 Imaging   Skeletal survey at [facility], no new lesions   12/07/2017 Imaging   No focal abnormality noted to suggest myeloma. Exam is stable from prior exam.   03/01/2018 Procedure   Successful 8 French right internal jugular vein power port placement with its tip at the SVC/RA junction.   03/06/2018 -  Chemotherapy   The patient had daratumumab    07/09/2018 Bone Marrow Biopsy   Bone marrow biopsy at [facility] showed residual disease at 0.004% plasma cells   07/03/2019 Bone Marrow Biopsy   A. Bone marrow, flow cytometric analysis for multiple myeloma minimal residual disease detection:   Negative. No phenotypically abnormal plasma cells at or above the limit of detection identified.     No monotypic B-cell population identified. Negative for increased blasts.   03/25/2021 Bone Marrow Biopsy   Repeat bone marrow biopsy at [place] showed female donor karyotype.  5 to 7% plasma cell is seen.  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   04/20/2021 Echocardiogram   1. Left ventricular ejection fraction, by estimation, is 60 to 65%. The left ventricle has normal function. The left ventricle has no regional wall motion abnormalities. Left ventricular diastolic parameters were normal.  2. Right ventricular systolic function is normal. The right ventricular size is normal.  3. The mitral valve is normal in structure. No evidence of mitral  valve regurgitation. No evidence of mitral stenosis.  4. The aortic valve is normal in structure. Aortic valve regurgitation is not visualized. No aortic stenosis is present.  5. The inferior vena cava is normal in size with greater than 50% respiratory variability, suggesting right atrial pressure of 3 mmHg.   04/29/2021 -  Chemotherapy   Patient is on Treatment Plan : MYELOMA RELAPSED/ REFRACTORY Carfilzomib D1,8,15 (20/27) + Dexamethasone (KPd) q28d     05/04/2021 PET scan   1. No evidence active multiple myeloma within the skeleton on FDG PET scan. 2. No suspicious lytic lesion on CT portion exam. 3. No plasmacytoma.   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   10/31/2015 Miscellaneous   She has engrafted     PHYSICAL EXAMINATION: ECOG PERFORMANCE STATUS: 1 - Symptomatic but completely ambulatory  Vitals:   05/05/21 0822  BP: 138/82  Pulse: 74  Resp: 18  Temp: 98.4 F (36.9 C)  SpO2: 100%   Filed Weights   05/05/21 0822  Weight: 157 lb 3.2 oz (71.3 kg)    GENERAL:alert, no distress and comfortable NEURO: alert & oriented x 3 with fluent speech, no focal motor/sensory deficits  LABORATORY DATA:  I have reviewed the data as listed    Component Value Date/Time   NA 140 05/05/2021 0809   NA 142 01/04/2017 0902   K 4.1 05/05/2021 0809   K 3.9 01/04/2017 0902   CL 106 05/05/2021  0809   CO2 25 05/05/2021 0809   CO2 28 01/04/2017 0902   GLUCOSE 129 (H) 05/05/2021 0809   GLUCOSE 92 01/04/2017 0902   BUN 18 05/05/2021 0809   BUN 9.5 01/04/2017 0902   CREATININE 0.75 05/05/2021 0809   CREATININE 0.7 01/04/2017 0902   CALCIUM 8.8 (L) 05/05/2021 0809   CALCIUM 9.0 01/04/2017 0902   PROT 6.5 05/05/2021 0809   PROT 6.0 (L) 01/04/2017 0902   PROT 5.8 (L) 01/04/2017 0902   ALBUMIN 3.2 (L) 05/05/2021 0809   ALBUMIN 3.4 (L) 01/04/2017 0902   AST 12 (L) 05/05/2021 0809   AST 21 01/04/2017 0902   ALT 8 05/05/2021 0809   ALT 16 01/04/2017 0902   ALKPHOS 78 05/05/2021 0809   ALKPHOS 101 01/04/2017 0902   BILITOT <0.2 (L) 05/05/2021 0809   BILITOT 0.56 01/04/2017 0902   GFRNONAA >60 05/05/2021 0809   GFRAA >60 03/25/2020 0917   GFRAA >60 12/18/2018 0901    No results found for: SPEP, UPEP  Lab Results  Component Value Date   WBC 6.0 05/05/2021   NEUTROABS 3.6 05/05/2021   HGB 10.5 (L) 05/05/2021   HCT 33.5 (L) 05/05/2021   MCV 106.0 (H) 05/05/2021   PLT 285 05/05/2021      Chemistry      Component Value Date/Time   NA 140 05/05/2021 0809   NA 142 01/04/2017 0902   K 4.1 05/05/2021 0809   K 3.9 01/04/2017 0902   CL 106 05/05/2021 0809   CO2 25 05/05/2021 0809   CO2 28 01/04/2017 0902   BUN 18 05/05/2021 0809   BUN 9.5 01/04/2017 0902   CREATININE 0.75 05/05/2021 0809   CREATININE 0.7 01/04/2017 0902      Component Value Date/Time   CALCIUM 8.8 (L) 05/05/2021 0809   CALCIUM 9.0 01/04/2017 0902   ALKPHOS 78 05/05/2021 0809   ALKPHOS 101 01/04/2017 0902   AST 12 (L) 05/05/2021 0809   AST 21 01/04/2017 0902   ALT 8 05/05/2021 0809   ALT 16 01/04/2017 0902   BILITOT <0.2 (L) 05/05/2021 0809   BILITOT 0.56 01/04/2017 0902       RADIOGRAPHIC STUDIES: I have personally reviewed the radiological images as listed and agreed with the findings in the report. NM PET Image Initial (PI) Whole Body  Result Date: 05/04/2021 CLINICAL DATA:  Initial  treatment strategy for multiple myeloma. Chemotherapy initiated.  EXAM: NUCLEAR MEDICINE PET WHOLE BODY TECHNIQUE: 7.8 mCi F-18 FDG was injected intravenously. Full-ring PET imaging was performed from the head to foot after the radiotracer. CT data was obtained and used for attenuation correction and anatomic localization. Fasting blood glucose: 98 mg/dl COMPARISON:  None. FINDINGS: Mediastinal blood pool activity: SUV max 2.4 HEAD/NECK: No hypermetabolic activity in the scalp. No hypermetabolic cervical lymph nodes. Incidental CT findings: none CHEST: No hypermetabolic mediastinal or hilar nodes. No suspicious pulmonary nodules on the CT scan. Incidental CT findings: Calcified granuloma in the RIGHT lower lobe. Coronary artery calcification and aortic atherosclerotic calcification. ABDOMEN/PELVIS: No abnormal hypermetabolic activity within the liver, pancreas, adrenal glands, or spleen. No hypermetabolic lymph nodes in the abdomen or pelvis. Incidental CT findings: Gallstone noted. Uterus and adnexa unremarkable. SKELETON: No focal hypermetabolic activity within the axillary appendicular skeleton. On the CT portion exam no suspicious lytic or sclerotic lesions present. Increased trabeculation through the iliac bones favored benign. Incidental CT findings: LEFT shoulder prosthetic EXTREMITIES: No abnormal hypermetabolic activity in the lower extremities. Incidental CT findings: none IMPRESSION: 1. No evidence active multiple myeloma within the skeleton on FDG PET scan. 2. No suspicious lytic lesion on CT portion exam. 3. No plasmacytoma. Electronically Signed   By: Suzy Bouchard M.D.   On: 05/04/2021 12:33   ECHOCARDIOGRAM COMPLETE  Result Date: 04/20/2021    ECHOCARDIOGRAM REPORT   Patient Name:   Jean Davidson Date of Exam: 04/20/2021 Medical Rec #:  [id number]      Height:       62.0 in Accession #:    [id number]     Weight:       159.4 lb Date of Birth:  06-May-1946      BSA:          1.736 m Patient Age:     75 years       BP:           118/77 mmHg Patient Gender: F              HR:           65 bpm. Exam Location:  [place] Procedure: 2D Echo, Cardiac Doppler, Color Doppler and Strain Analysis Indications:    Z51.11 Encounter for antineoplastic chemotheraphy; C90.02                 Multiple myeloma in relapse  History:        Patient has prior history of Echocardiogram examinations, most                 recent 02/18/2020. Risk Factors:Hypertension and Former Smoker.  Sonographer:    Caesar Chestnut RDCS, RVT Referring Phys: [phone] Alveda Vanhorne [facility]  Sonographer Comments: Global longitudinal strain was attempted. IMPRESSIONS  1. Left ventricular ejection fraction, by estimation, is 60 to 65%. The left ventricle has normal function. The left ventricle has no regional wall motion abnormalities. Left ventricular diastolic parameters were normal.  2. Right ventricular systolic function is normal. The right ventricular size is normal.  3. The mitral valve is normal in structure. No evidence of mitral valve regurgitation. No evidence of mitral stenosis.  4. The aortic valve is normal in structure. Aortic valve regurgitation is not visualized. No aortic stenosis is present.  5. The inferior vena cava is normal in size with greater than 50% respiratory variability, suggesting right atrial pressure of 3 mmHg. Comparison(s): LVEF 60-65. FINDINGS  Left Ventricle: Left ventricular ejection fraction, by estimation, is 60 to 65%.  The left ventricle has normal function. The left ventricle has no regional wall motion abnormalities. The left ventricular internal cavity size was normal in size. There is  no left ventricular hypertrophy. Left ventricular diastolic parameters were normal. Right Ventricle: The right ventricular size is normal. No increase in right ventricular wall thickness. Right ventricular systolic function is normal. Left Atrium: Left atrial size was normal in size. Right Atrium: Right atrial size was normal in size.  Pericardium: There is no evidence of pericardial effusion. Mitral Valve: The mitral valve is normal in structure. No evidence of mitral valve regurgitation. No evidence of mitral valve stenosis. Tricuspid Valve: The tricuspid valve is normal in structure. Tricuspid valve regurgitation is not demonstrated. No evidence of tricuspid stenosis. Aortic Valve: The aortic valve is normal in structure. Aortic valve regurgitation is not visualized. No aortic stenosis is present. Aortic valve mean gradient measures 3.5 mmHg. Aortic valve peak gradient measures 6.6 mmHg. Aortic valve area, by VTI measures 2.55 cm. Pulmonic Valve: The pulmonic valve was normal in structure. Pulmonic valve regurgitation is not visualized. No evidence of pulmonic stenosis. Aorta: The aortic root is normal in size and structure. Venous: The inferior vena cava is normal in size with greater than 50% respiratory variability, suggesting right atrial pressure of 3 mmHg. IAS/Shunts: No atrial level shunt detected by color flow Doppler.  LEFT VENTRICLE PLAX 2D LVIDd:         4.40 cm     Diastology LVIDs:         2.40 cm     LV e' medial:    7.18 cm/s LV PW:         1.10 cm     LV E/e' medial:  14.2 LV IVS:        0.90 cm     LV e' lateral:   10.60 cm/s LVOT diam:     2.00 cm     LV E/e' lateral: 9.6 LV SV:         73 LV SV Index:   42 LVOT Area:     3.14 cm  LV Volumes (MOD) LV vol d, MOD A2C: 58.7 ml LV vol d, MOD A4C: 55.9 ml LV vol s, MOD A2C: 16.6 ml LV vol s, MOD A4C: 23.1 ml LV SV MOD A2C:     42.1 ml LV SV MOD A4C:     55.9 ml LV SV MOD BP:      38.7 ml RIGHT VENTRICLE             IVC RV Basal diam:  2.70 cm     IVC diam: 1.70 cm RV Mid diam:    2.10 cm RV S prime:     15.10 cm/s TAPSE (M-mode): 2.7 cm LEFT ATRIUM             Index        RIGHT ATRIUM           Index LA diam:        4.00 cm 2.30 cm/m   RA Area:     11.30 cm LA Vol (A2C):   44.5 ml 25.63 ml/m  RA Volume:   21.90 ml  12.62 ml/m LA Vol (A4C):   34.4 ml 19.82 ml/m LA Biplane  Vol: 39.3 ml 22.64 ml/m  AORTIC VALVE                    PULMONIC VALVE AV Area (Vmax):    2.86 cm  PV Vmax:          0.80 m/s AV Area (Vmean):   2.80 cm     PV Vmean:         50.500 cm/s AV Area (VTI):     2.55 cm     PV VTI:           0.160 m AV Vmax:           128.50 cm/s  PV Peak grad:     2.6 mmHg AV Vmean:          86.400 cm/s  PV Mean grad:     1.0 mmHg AV VTI:            0.288 m      PR End Diast Vel: 1.89 msec AV Peak Grad:      6.6 mmHg AV Mean Grad:      3.5 mmHg LVOT Vmax:         117.00 cm/s LVOT Vmean:        77.000 cm/s LVOT VTI:          0.233 m LVOT/AV VTI ratio: 0.81  AORTA Ao Root diam: 3.00 cm Ao Asc diam:  3.10 cm Ao Arch diam: 2.4 cm MITRAL VALVE                TRICUSPID VALVE MV Area (PHT): 3.76 cm     TR Peak grad:   21.2 mmHg MV Decel Time: 202 msec     TR Vmax:        230.00 cm/s MV E velocity: 102.00 cm/s MV A velocity: 99.00 cm/s   SHUNTS MV E/A ratio:  1.03         Systemic VTI:  0.23 m                             Systemic Diam: 2.00 cm Jyl Heinz MD Electronically signed by Jyl Heinz MD Signature Date/Time: 04/20/2021/11:26:36 AM    Final

## 2021-05-05 NOTE — Assessment & Plan Note (Signed)
So far, she tolerated carfilzomib well I have reviewed PET CT imaging with the patient which showed no bone lesions She is wondering when Cytoxan will be added My priority would be to get her through first month of treatment and reassess If she had positive response to treatment, my first goal would be to reduce the dose of dexamethasone However, if response rates are not robust, we can consider adding Cytoxan next She will continue calcium and vitamin D supplement and acyclovir for antimicrobial prophylaxis We will add on Zometa down the road

## 2021-05-10 MED FILL — Dexamethasone Sodium Phosphate Inj 100 MG/10ML: INTRAMUSCULAR | Qty: 2 | Status: AC

## 2021-05-11 ENCOUNTER — Other Ambulatory Visit: Payer: Self-pay

## 2021-05-11 ENCOUNTER — Inpatient Hospital Stay: Payer: Medicare HMO

## 2021-05-11 VITALS — BP 140/62 | HR 60 | Temp 98.7°F | Resp 17 | Wt 157.2 lb

## 2021-05-11 DIAGNOSIS — C9002 Multiple myeloma in relapse: Secondary | ICD-10-CM

## 2021-05-11 DIAGNOSIS — Z5112 Encounter for antineoplastic immunotherapy: Secondary | ICD-10-CM | POA: Diagnosis not present

## 2021-05-11 DIAGNOSIS — D469 Myelodysplastic syndrome, unspecified: Secondary | ICD-10-CM

## 2021-05-11 LAB — CBC WITH DIFFERENTIAL (CANCER CENTER ONLY)
Abs Immature Granulocytes: 0.02 10*3/uL (ref 0.00–0.07)
Basophils Absolute: 0 10*3/uL (ref 0.0–0.1)
Basophils Relative: 0 %
Eosinophils Absolute: 0.2 10*3/uL (ref 0.0–0.5)
Eosinophils Relative: 2 %
HCT: 33 % — ABNORMAL LOW (ref 36.0–46.0)
Hemoglobin: 10.4 g/dL — ABNORMAL LOW (ref 12.0–15.0)
Immature Granulocytes: 0 %
Lymphocytes Relative: 30 %
Lymphs Abs: 1.9 10*3/uL (ref 0.7–4.0)
MCH: 33.5 pg (ref 26.0–34.0)
MCHC: 31.5 g/dL (ref 30.0–36.0)
MCV: 106.5 fL — ABNORMAL HIGH (ref 80.0–100.0)
Monocytes Absolute: 0.7 10*3/uL (ref 0.1–1.0)
Monocytes Relative: 11 %
Neutro Abs: 3.4 10*3/uL (ref 1.7–7.7)
Neutrophils Relative %: 57 %
Platelet Count: 248 10*3/uL (ref 150–400)
RBC: 3.1 MIL/uL — ABNORMAL LOW (ref 3.87–5.11)
RDW: 15.4 % (ref 11.5–15.5)
WBC Count: 6.2 10*3/uL (ref 4.0–10.5)
nRBC: 0 % (ref 0.0–0.2)

## 2021-05-11 LAB — CMP (CANCER CENTER ONLY)
ALT: 8 U/L (ref 0–44)
AST: 13 U/L — ABNORMAL LOW (ref 15–41)
Albumin: 3.1 g/dL — ABNORMAL LOW (ref 3.5–5.0)
Alkaline Phosphatase: 69 U/L (ref 38–126)
Anion gap: 9 (ref 5–15)
BUN: 12 mg/dL (ref 8–23)
CO2: 24 mmol/L (ref 22–32)
Calcium: 8.8 mg/dL — ABNORMAL LOW (ref 8.9–10.3)
Chloride: 108 mmol/L (ref 98–111)
Creatinine: 0.71 mg/dL (ref 0.44–1.00)
GFR, Estimated: >60 mL/min (ref >60)
Glucose, Bld: 94 mg/dL (ref 70–99)
Potassium: 4.1 mmol/L (ref 3.5–5.1)
Sodium: 141 mmol/L (ref 135–145)
Total Bilirubin: <0.2 mg/dL — ABNORMAL LOW (ref 0.3–1.2)
Total Protein: 6 g/dL — ABNORMAL LOW (ref 6.5–8.1)

## 2021-05-11 MED ORDER — SODIUM CHLORIDE 0.9% FLUSH
10.0000 mL | Freq: Once | INTRAVENOUS | Status: AC
  Administered 2021-05-11: 10 mL

## 2021-05-11 MED ORDER — HEPARIN SOD (PORK) LOCK FLUSH 100 UNIT/ML IV SOLN
500.0000 [IU] | Freq: Once | INTRAVENOUS | Status: AC | PRN
  Administered 2021-05-11: 500 [IU]

## 2021-05-11 MED ORDER — SODIUM CHLORIDE 0.9% FLUSH
10.0000 mL | INTRAVENOUS | Status: DC | PRN
  Administered 2021-05-11: 10 mL

## 2021-05-11 MED ORDER — SODIUM CHLORIDE 0.9 % IV SOLN: Freq: Once | INTRAVENOUS | Status: AC

## 2021-05-11 MED ORDER — DEXTROSE 5 % IV SOLN
27.0000 mg/m2 | Freq: Once | INTRAVENOUS | Status: AC
  Administered 2021-05-11: 50 mg via INTRAVENOUS
  Filled 2021-05-11: qty 10

## 2021-05-11 MED ORDER — SODIUM CHLORIDE 0.9 % IV SOLN
20.0000 mg | Freq: Once | INTRAVENOUS | Status: AC
  Administered 2021-05-11: 20 mg via INTRAVENOUS
  Filled 2021-05-11: qty 20

## 2021-05-11 NOTE — Patient Instructions (Signed)
[facility]  Discharge Instructions: Thank you for choosing [facility] to provide your oncology and hematology care.   If you have a lab appointment with the Cancer Center, please go directly to the Cancer Center and check in at the registration area.   Wear comfortable clothing and clothing appropriate for easy access to any Portacath or PICC line.   We strive to give you quality time with your provider. You may need to reschedule your appointment if you arrive late (15 or more minutes).  Arriving late affects you and other patients whose appointments are after yours.  Also, if you miss three or more appointments without notifying the office, you may be dismissed from the clinic at the provider's discretion.      For prescription refill requests, have your pharmacy contact our office and allow 72 hours for refills to be completed.    Today you received the following chemotherapy and/or immunotherapy agents: Kyprolis    To help prevent nausea and vomiting after your treatment, we encourage you to take your nausea medication as directed.  BELOW ARE SYMPTOMS THAT SHOULD BE REPORTED IMMEDIATELY: . *FEVER GREATER THAN 100.4 F (38 C) OR HIGHER . *CHILLS OR SWEATING . *NAUSEA AND VOMITING THAT IS NOT CONTROLLED WITH YOUR NAUSEA MEDICATION . *UNUSUAL SHORTNESS OF BREATH . *UNUSUAL BRUISING OR BLEEDING . *URINARY PROBLEMS (pain or burning when urinating, or frequent urination) . *BOWEL PROBLEMS (unusual diarrhea, constipation, pain near the anus) . TENDERNESS IN MOUTH AND THROAT WITH OR WITHOUT PRESENCE OF ULCERS (sore throat, sores in mouth, or a toothache) . UNUSUAL RASH, SWELLING OR PAIN  . UNUSUAL VAGINAL DISCHARGE OR ITCHING   Items with * indicate a potential emergency and should be followed up as soon as possible or go to the Emergency Department if any problems should occur.  Please show the CHEMOTHERAPY ALERT CARD or IMMUNOTHERAPY ALERT  CARD at check-in to the Emergency Department and triage nurse.  Should you have questions after your visit or need to cancel or reschedule your appointment, please contact [facility]  Dept: [phone]  and follow the prompts.  Office hours are 8:00 a.m. to 4:30 p.m. Monday - Friday. Please note that voicemails left after 4:00 p.m. may not be returned until the following business day.  We are closed weekends and major holidays. You have access to a nurse at all times for urgent questions. Please call the main number to the clinic Dept: [phone] and follow the prompts.   For any non-urgent questions, you may also contact your provider using MyChart. We now offer e-Visits for anyone 18 and older to request care online for non-urgent symptoms. For details visit [email or web address].   Also download the MyChart app! Go to the app store, search "MyChart", open the app, select [facility], and log in with your MyChart username and password.  Due to Covid, a mask is required upon entering the hospital/clinic. If you do not have a mask, one will be given to you upon arrival. For doctor visits, patients may have 1 support person aged 18 or older with them. For treatment visits, patients cannot have anyone with them due to current Covid guidelines and our immunocompromised population.   

## 2021-05-25 MED FILL — Dexamethasone Sodium Phosphate Inj 100 MG/10ML: INTRAMUSCULAR | Qty: 2 | Status: AC

## 2021-05-26 ENCOUNTER — Inpatient Hospital Stay: Payer: Medicare HMO

## 2021-05-26 ENCOUNTER — Other Ambulatory Visit: Payer: Self-pay

## 2021-05-26 ENCOUNTER — Inpatient Hospital Stay: Payer: Medicare HMO | Attending: Hematology and Oncology

## 2021-05-26 ENCOUNTER — Encounter: Payer: Self-pay | Admitting: Hematology and Oncology

## 2021-05-26 ENCOUNTER — Inpatient Hospital Stay: Payer: Medicare HMO | Admitting: Hematology and Oncology

## 2021-05-26 DIAGNOSIS — C9002 Multiple myeloma in relapse: Secondary | ICD-10-CM

## 2021-05-26 DIAGNOSIS — D469 Myelodysplastic syndrome, unspecified: Secondary | ICD-10-CM

## 2021-05-26 DIAGNOSIS — Z5112 Encounter for antineoplastic immunotherapy: Secondary | ICD-10-CM | POA: Insufficient documentation

## 2021-05-26 DIAGNOSIS — D649 Anemia, unspecified: Secondary | ICD-10-CM

## 2021-05-26 DIAGNOSIS — C946 Myelodysplastic disease, not classified: Secondary | ICD-10-CM

## 2021-05-26 LAB — CMP (CANCER CENTER ONLY)
ALT: 9 U/L (ref 0–44)
AST: 13 U/L — ABNORMAL LOW (ref 15–41)
Albumin: 3.4 g/dL — ABNORMAL LOW (ref 3.5–5.0)
Alkaline Phosphatase: 89 U/L (ref 38–126)
Anion gap: 9 (ref 5–15)
BUN: 15 mg/dL (ref 8–23)
CO2: 25 mmol/L (ref 22–32)
Calcium: 9.1 mg/dL (ref 8.9–10.3)
Chloride: 106 mmol/L (ref 98–111)
Creatinine: 0.72 mg/dL (ref 0.44–1.00)
GFR, Estimated: >60 mL/min (ref >60)
Glucose, Bld: 90 mg/dL (ref 70–99)
Potassium: 4 mmol/L (ref 3.5–5.1)
Sodium: 140 mmol/L (ref 135–145)
Total Bilirubin: 0.3 mg/dL (ref 0.3–1.2)
Total Protein: 6 g/dL — ABNORMAL LOW (ref 6.5–8.1)

## 2021-05-26 LAB — CBC WITH DIFFERENTIAL (CANCER CENTER ONLY)
Abs Immature Granulocytes: 0.01 10*3/uL (ref 0.00–0.07)
Basophils Absolute: 0 10*3/uL (ref 0.0–0.1)
Basophils Relative: 0 %
Eosinophils Absolute: 0.1 10*3/uL (ref 0.0–0.5)
Eosinophils Relative: 2 %
HCT: 34 % — ABNORMAL LOW (ref 36.0–46.0)
Hemoglobin: 11.1 g/dL — ABNORMAL LOW (ref 12.0–15.0)
Immature Granulocytes: 0 %
Lymphocytes Relative: 30 %
Lymphs Abs: 1.8 10*3/uL (ref 0.7–4.0)
MCH: 34.6 pg — ABNORMAL HIGH (ref 26.0–34.0)
MCHC: 32.6 g/dL (ref 30.0–36.0)
MCV: 105.9 fL — ABNORMAL HIGH (ref 80.0–100.0)
Monocytes Absolute: 0.5 10*3/uL (ref 0.1–1.0)
Monocytes Relative: 9 %
Neutro Abs: 3.5 10*3/uL (ref 1.7–7.7)
Neutrophils Relative %: 59 %
Platelet Count: 219 10*3/uL (ref 150–400)
RBC: 3.21 MIL/uL — ABNORMAL LOW (ref 3.87–5.11)
RDW: 14.6 % (ref 11.5–15.5)
WBC Count: 5.9 10*3/uL (ref 4.0–10.5)
nRBC: 0 % (ref 0.0–0.2)

## 2021-05-26 LAB — TROPONIN I (HIGH SENSITIVITY): Troponin I (High Sensitivity): 5 ng/L (ref <18)

## 2021-05-26 MED ORDER — SODIUM CHLORIDE 0.9 % IV SOLN
20.0000 mg | Freq: Once | INTRAVENOUS | Status: AC
  Administered 2021-05-26: 20 mg via INTRAVENOUS
  Filled 2021-05-26: qty 20

## 2021-05-26 MED ORDER — SODIUM CHLORIDE 0.9% FLUSH
10.0000 mL | Freq: Once | INTRAVENOUS | Status: AC
Start: 2021-05-26 — End: 2021-05-26
  Administered 2021-05-26: 10 mL

## 2021-05-26 MED ORDER — DEXTROSE 5 % IV SOLN
27.0000 mg/m2 | Freq: Once | INTRAVENOUS | Status: AC
  Administered 2021-05-26: 50 mg via INTRAVENOUS
  Filled 2021-05-26: qty 15

## 2021-05-26 MED ORDER — SODIUM CHLORIDE 0.9 % IV SOLN
Freq: Once | INTRAVENOUS | Status: AC
Start: 2021-05-26 — End: 2021-05-26

## 2021-05-26 MED ORDER — SODIUM CHLORIDE 0.9% FLUSH
10.0000 mL | INTRAVENOUS | Status: DC | PRN
  Administered 2021-05-26: 10 mL

## 2021-05-26 MED ORDER — HEPARIN SOD (PORK) LOCK FLUSH 100 UNIT/ML IV SOLN
500.0000 [IU] | Freq: Once | INTRAVENOUS | Status: AC | PRN
  Administered 2021-05-26: 500 [IU]

## 2021-05-26 NOTE — Assessment & Plan Note (Signed)
So far, she tolerated carfilzomib well She is wondering when Cytoxan will be added Results of her myeloma panel will be back next week and we will call her with test results My priority would be to get her through first month of treatment and reassess If she had positive response to treatment, my first goal would be to reduce the dose of dexamethasone However, if response rates are not robust, we can consider adding Cytoxan next She will continue calcium and vitamin D supplement and acyclovir for antimicrobial prophylaxis She sees dentist on a regular basis every 3 months with no dental issues We will add Zometa in her next infusion, to be given every 54-month

## 2021-05-26 NOTE — Patient Instructions (Signed)
[facility]COL[facility]  Discharge Instructions: Thank you for choosing [facility] to provide your oncology and hematology care.   If you have a lab appointment with the Cancer Center, please go directly to the Cancer Center and check in at the registration area.   Wear comfortable clothing and clothing appropriate for easy access to any Portacath or PICC line.   We strive to give you quality time with your provider. You may need to reschedule your appointment if you arrive late (15 or more minutes).  Arriving late affects you and other patients whose appointments are after yours.  Also, if you miss three or more appointments without notifying the office, you may be dismissed from the clinic at the provider's discretion.      For prescription refill requests, have your pharmacy contact our office and allow 72 hours for refills to be completed.    Today you received the following chemotherapy and/or immunotherapy agents carfilzomib   To help prevent nausea and vomiting after your treatment, we encourage you to take your nausea medication as directed.  BELOW ARE SYMPTOMS THAT SHOULD BE REPORTED IMMEDIATELY: . *FEVER GREATER THAN 100.4 F (38 C) OR HIGHER . *CHILLS OR SWEATING . *NAUSEA AND VOMITING THAT IS NOT CONTROLLED WITH YOUR NAUSEA MEDICATION . *UNUSUAL SHORTNESS OF BREATH . *UNUSUAL BRUISING OR BLEEDING . *URINARY PROBLEMS (pain or burning when urinating, or frequent urination) . *BOWEL PROBLEMS (unusual diarrhea, constipation, pain near the anus) . TENDERNESS IN MOUTH AND THROAT WITH OR WITHOUT PRESENCE OF ULCERS (sore throat, sores in mouth, or a toothache) . UNUSUAL RASH, SWELLING OR PAIN  . UNUSUAL VAGINAL DISCHARGE OR ITCHING   Items with * indicate a potential emergency and should be followed up as soon as possible or go to the Emergency Department if any problems should occur.  Please show the CHEMOTHERAPY ALERT CARD or IMMUNOTHERAPY ALERT  CARD at check-in to the Emergency Department and triage nurse.  Should you have questions after your visit or need to cancel or reschedule your appointment, please contact [facility]  Dept: [phone]  and follow the prompts.  Office hours are 8:00 a.m. to 4:30 p.m. Monday - Friday. Please note that voicemails left after 4:00 p.m. may not be returned until the following business day.  We are closed weekends and major holidays. You have access to a nurse at all times for urgent questions. Please call the main number to the clinic Dept: [phone] and follow the prompts.   For any non-urgent questions, you may also contact your provider using MyChart. We now offer e-Visits for anyone 18 and older to request care online for non-urgent symptoms. For details visit [email or web address].   Also download the MyChart app! Go to the app store, search "MyChart", open the app, select [facility], and log in with your MyChart username and password.  Due to Covid, a mask is required upon entering the hospital/clinic. If you do not have a mask, one will be given to you upon arrival. For doctor visits, patients may have 1 support person aged 18 or older with them. For treatment visits, patients cannot have anyone with them due to current Covid guidelines and our immunocompromised population.   

## 2021-05-26 NOTE — Assessment & Plan Note (Signed)
This is likely due to recent treatment. The patient denies recent history of bleeding such as epistaxis, hematuria or hematochezia. She is asymptomatic from the anemia. I will observe for now.   

## 2021-05-26 NOTE — Progress Notes (Signed)
[facility] OFF[facility] PROGRESS NOTE  Patient Care Team: [name], [name], MD as PCP - General (Internal Medicine) [name], MD as PCP - Cardiology (Cardiology) [name], MD as Referring Physician (Hematology and Oncology) [name], MD as Consulting Physician (Internal Medicine) [name], [name], MD as Consulting Physician (Infectious Diseases) [name], MD as Consulting Physician (Hematology and Oncology) [name], NP as Nurse Practitioner (Hematology and Oncology) [name], MD as Consulting Physician (Hematology and Oncology)  ASSESSMENT & PLAN:  Multiple myeloma in relapse [facility]) So far, she tolerated carfilzomib well She is wondering when Cytoxan will be added Results of her myeloma panel will be back next week and we will call her with test results My priority would be to get her through first month of treatment and reassess If she had positive response to treatment, my first goal would be to reduce the dose of dexamethasone However, if response rates are not robust, we can consider adding Cytoxan next She will continue calcium and vitamin D supplement and acyclovir for antimicrobial prophylaxis She sees dentist on a regular basis every 3 months with no dental issues We will add Zometa in her next infusion, to be given every 27-month Normocytic anemia This is likely due to recent treatment. The patient denies recent history of bleeding such as epistaxis, hematuria or hematochezia. She is asymptomatic from the anemia. I will observe for now.    No orders of the defined types were placed in this encounter.   All questions were answered. The patient knows to call the clinic with any problems, questions or concerns. The total time spent in the appointment was 20 minutes encounter with patients including review of chart and various tests results, discussions about plan of care and coordination of care plan    NHeath Lark MD 05/26/2021 9:53 AM  INTERVAL HISTORY: Please see below for problem oriented charting. she returns for treatment follow-up on carfilzomib for recurrent multiple myeloma She tolerated treatment well No recent side effects such as nausea or changes in bowel habits She has cold intolerance No new bone pain  REVIEW OF SYSTEMS:   Constitutional: Denies fevers, chills or abnormal weight loss Eyes: Denies blurriness of vision Ears, nose, mouth, throat, and face: Denies mucositis or sore throat Respiratory: Denies cough, dyspnea or wheezes Cardiovascular: Denies palpitation, chest discomfort or lower extremity swelling Gastrointestinal:  Denies nausea, heartburn or change in bowel habits Skin: Denies abnormal skin rashes Lymphatics: Denies new lymphadenopathy or easy bruising Neurological:Denies numbness, tingling or new weaknesses Behavioral/Psych: Mood is stable, no new changes  All other systems were reviewed with the patient and are negative.  I have reviewed the past medical history, past surgical history, social history and family history with the patient and they are unchanged from previous note.  ALLERGIES:  has No Known Allergies.  MEDICATIONS:  Current Outpatient Medications  Medication Sig Dispense Refill   acyclovir (ZOVIRAX) 400 MG tablet Take 1 tablet (400 mg total) by mouth daily. 30 tablet 11   aspirin 325 MG tablet Take 325 mg by mouth daily.     calcium carbonate (TUMS - DOSED IN MG ELEMENTAL CALCIUM) 500 MG chewable tablet Chew 1 tablet by mouth daily.     Carfilzomib (KYPROLIS IV) Inject into the vein once a week.     Cholecalciferol 25 MCG (1000 UT) tablet Take 1,000 Units by mouth daily.     Cyanocobalamin (VITAMIN B12 PO) Take 1 tablet by mouth  daily.     loperamide (IMODIUM) 2 MG capsule Take by mouth as needed for diarrhea or loose stools. As needed only     metoprolol tartrate (LOPRESSOR) 25 MG tablet TAKE 1.5 TABLETS (37.5 MG TOTAL) BY MOUTH 2  (TWO) TIMES DAILY. 270 tablet 1   mirtazapine (REMERON) 15 MG tablet TAKE 1 TABLET BY MOUTH EVERYDAY AT BEDTIME 90 tablet 1   Multiple Vitamin (MULTIVITAMIN WITH MINERALS) TABS tablet Take 1 tablet by mouth daily.     Multiple Vitamins-Minerals (PRESERVISION AREDS 2 PO) Take 1 tablet by mouth 2 (two) times daily.     ondansetron (ZOFRAN) 8 MG tablet Take 1 tablet (8 mg total) by mouth every 8 (eight) hours as needed (Nausea or vomiting). 30 tablet 1   pantoprazole (PROTONIX) 40 MG tablet Take 1 tablet (40 mg total) by mouth 2 (two) times daily before a meal. 60 tablet 5   prochlorperazine (COMPAZINE) 10 MG tablet Take 1 tablet (10 mg total) by mouth every 6 (six) hours as needed (Nausea or vomiting). 30 tablet 1   No current facility-administered medications for this visit.   Facility-Administered Medications Ordered in Other Visits  Medication Dose Route Frequency Provider Last Rate Last Admin   carfilzomib (KYPROLIS) 50 mg in dextrose 5 % 50 mL chemo infusion  27 mg/m2 (Treatment Plan Recorded) Intravenous Once Alvy Bimler, Theus Espin, MD       dexamethasone (DECADRON) 20 mg in sodium chloride 0.9 % 50 mL IVPB  20 mg Intravenous Once [name], MD 208 mL/hr at 05/26/21 0943 20 mg at 05/26/21 0943   heparin lock flush 100 unit/mL  500 Units Intracatheter Once PRN Alvy Bimler, Zoya Sprecher, MD       sodium chloride flush (NS) 0.9 % injection 10 mL  10 mL Intracatheter PRN Alvy Bimler, Emeri Estill, MD        SUMMARY OF ONCOLOGIC HISTORY: Oncology History Overview Note  Multiple myeloma, Ig A Lambda, M spike 3.54 grams, Calcium 9.2, Creatinine 0.8, Beta 2 microglobulin 4.52, IgA 4840 mg/dL, lambda light chain 75.4, albumin 3.6, hemoglobin 9.7, platelet 115    Primary site: Multiple Myeloma   Staging method: AJCC 6th Edition   Clinical: Stage IIA signed by [name], MD on 11/07/2013  2:46 PM   Summary: Stage IIA S/p Allo transplant at [facility] on revlimid, pomalyst, daratumumab and velcade     Multiple myeloma in  relapse ([facility])  10/31/2013 Bone Marrow Biopsy   Bone marrow biopsy confirmed multiple myeloma with 40% bone marrow involvement. Skeletal survey showed minimal lesions in her score with generalized demineralization   11/10/2013 - 02/13/2014 Chemotherapy   The patient is started on induction chemotherapy with weekly dexamethasone 40 mg by mouth as well as Velcade subcutaneous injection on days 1, 4, 8 and 11. On 11/21/2013, she was started on monthly Zometa.   12/23/2013 Adverse Reaction   The dose of Velcade was reduced due to thrombocytopenia.   01/28/2014 - 04/07/2014 Chemotherapy   Revlimid is added. Treatment was discontinued due to lack of response.   02/24/2014 - 04/07/2014 Chemotherapy   Due to worsening peripheral neuropathy, Velcade injection is changed to once a week. Revlimid was given 21 days on, 7 days off.   04/07/2014 - 04/10/2014 Chemotherapy   Revlimid was discontinued due to lack of response. Chemotherapy was changed back to Velcade injection twice a week, 2 weeks on 1 week off. Her treatment was switched to to minimum response   04/20/2014 - 06/02/2014 Chemotherapy   chemotherapy is switched to  Carfilzomib, Cytoxan and dexamethasone.   04/22/2014 Procedure   she has placement of port for chemotherapy.   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 at [facility]   10/20/2014 - 03/24/2015 Chemotherapy    she received chemotherapy with Kyprolis, Revlimid and dexamethasone   10/22/2014 Procedure   She has port placement   01/19/2015 Tumor Marker   IgA lambda M spike at 0.4 g    01/20/2015  Miscellaneous   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 No cource was found and fever resolved with IV vancomycin and meropenem   09/13/2015 Bone Marrow Biopsy   Bone marrow biopsy showed no increased blasts, 3-4 % plasma cells   03/02/2016 Bone Marrow Biopsy   Bone marrow biopsy at [facility] showed normocellular (30%) bone marrow with trilineage hematopoiesis. No significant increase in blasts. No significant increase in plasma cells.   05/12/2016 Imaging   DEXA scan at [facility] showed osteopenia   10/24/2016 Imaging   Skeletal survey at [facility], no new lesions   12/07/2017 Imaging   No focal abnormality noted to suggest myeloma. Exam is stable from prior exam.   03/01/2018 Procedure   Successful 8 French right internal jugular vein power port placement with its tip at the SVC/RA junction.   03/06/2018 -  Chemotherapy   The patient had daratumumab    07/09/2018 Bone Marrow Biopsy   Bone marrow biopsy at [facility] showed residual disease at 0.004% plasma cells   07/03/2019 Bone Marrow Biopsy   A. Bone marrow, flow cytometric analysis for multiple myeloma minimal residual disease detection:   Negative. No phenotypically abnormal plasma cells at or above the limit of detection identified.     No monotypic B-cell population identified. Negative for increased blasts.   03/25/2021 Bone Marrow Biopsy   Repeat bone marrow biopsy at [facility] showed female donor karyotype.  5 to 7% plasma cell is seen.  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   04/20/2021 Echocardiogram   1. Left ventricular ejection fraction, by estimation, is 60 to 65%. The left ventricle has normal function. The left ventricle has no regional wall motion abnormalities. Left ventricular diastolic parameters were normal.  2. Right ventricular systolic function is normal. The right ventricular size is normal.  3. The mitral valve is normal in structure. No evidence of mitral valve regurgitation. No evidence of mitral stenosis.  4. The aortic valve is normal in structure. Aortic valve regurgitation is not visualized. No aortic stenosis is present.  5. The inferior  vena cava is normal in size with greater than 50% respiratory variability, suggesting right atrial pressure of 3 mmHg.   04/29/2021 -  Chemotherapy   Patient is on Treatment Plan : MYELOMA RELAPSED/ REFRACTORY Carfilzomib D1,8,15 (20/27) + Dexamethasone (KPd) q28d     05/04/2021 PET scan   1. No evidence active multiple myeloma within the skeleton on FDG PET scan. 2. No suspicious lytic lesion on CT portion exam. 3. No plasmacytoma.   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   10/31/2015 Miscellaneous   She has engrafted     PHYSICAL EXAMINATION: ECOG PERFORMANCE STATUS: 1 -  Symptomatic but completely ambulatory  Vitals:   05/26/21 0843  BP: (!) 142/57  Pulse: 70  Resp: 18  Temp: (!) 97.4 F (36.3 C)  SpO2: 99%   Filed Weights   05/26/21 0843  Weight: 157 lb 11.2 oz (71.5 kg)    GENERAL:alert, no distress and comfortable NEURO: alert & oriented x 3 with fluent speech, no focal motor/sensory deficits  LABORATORY DATA:  I have reviewed the data as listed    Component Value Date/Time   NA 140 05/26/2021 0802   NA 142 01/04/2017 0902   K 4.0 05/26/2021 0802   K 3.9 01/04/2017 0902   CL 106 05/26/2021 0802   CO2 25 05/26/2021 0802   CO2 28 01/04/2017 0902   GLUCOSE 90 05/26/2021 0802   GLUCOSE 92 01/04/2017 0902   BUN 15 05/26/2021 0802   BUN 9.5 01/04/2017 0902   CREATININE 0.72 05/26/2021 0802   CREATININE 0.7 01/04/2017 0902   CALCIUM 9.1 05/26/2021 0802   CALCIUM 9.0 01/04/2017 0902   PROT 6.0 (L) 05/26/2021 0802   PROT 6.0 (L) 01/04/2017 0902   PROT 5.8 (L) 01/04/2017 0902   ALBUMIN 3.4 (L) 05/26/2021 0802   ALBUMIN 3.4 (L) 01/04/2017 0902   AST 13 (L) 05/26/2021 0802   AST 21 01/04/2017 0902   ALT 9 05/26/2021 0802   ALT 16 01/04/2017 0902   ALKPHOS 89 05/26/2021 0802   ALKPHOS 101 01/04/2017 0902   BILITOT 0.3 05/26/2021 0802   BILITOT 0.56 01/04/2017 0902   GFRNONAA >60 05/26/2021 0802   GFRAA >60 03/25/2020 0917   GFRAA >60 12/18/2018 0901    No results found for: SPEP, UPEP  Lab Results  Component Value Date   WBC 5.9 05/26/2021   NEUTROABS 3.5 05/26/2021   HGB 11.1 (L) 05/26/2021   HCT 34.0 (L) 05/26/2021   MCV 105.9 (H) 05/26/2021   PLT 219 05/26/2021      Chemistry      Component Value Date/Time   NA 140 05/26/2021 0802   NA 142 01/04/2017 0902   K 4.0 05/26/2021 0802   K 3.9 01/04/2017 0902   CL 106 05/26/2021 0802   CO2 25 05/26/2021 0802   CO2 28 01/04/2017 0902   BUN 15 05/26/2021 0802   BUN 9.5 01/04/2017 0902   CREATININE 0.72 05/26/2021 0802   CREATININE 0.7 01/04/2017 0902  Component  Value Date/Time   CALCIUM 9.1 05/26/2021 0802   CALCIUM 9.0 01/04/2017 0902   ALKPHOS 89 05/26/2021 0802   ALKPHOS 101 01/04/2017 0902   AST 13 (L) 05/26/2021 0802   AST 21 01/04/2017 0902   ALT 9 05/26/2021 0802   ALT 16 01/04/2017 0902   BILITOT 0.3 05/26/2021 0802   BILITOT 0.56 01/04/2017 0902

## 2021-05-27 LAB — KAPPA/LAMBDA LIGHT CHAINS
Kappa free light chain: 7.9 mg/L (ref 3.3–19.4)
Kappa, lambda light chain ratio: 0.06 — ABNORMAL LOW (ref 0.26–1.65)
Lambda free light chains: 129.4 mg/L — ABNORMAL HIGH (ref 5.7–26.3)

## 2021-05-30 LAB — MULTIPLE MYELOMA PANEL, SERUM
Albumin SerPl Elph-Mcnc: 3.2 g/dL (ref 2.9–4.4)
Albumin/Glob SerPl: 1.4 (ref 0.7–1.7)
Alpha 1: 0.2 g/dL (ref 0.0–0.4)
Alpha2 Glob SerPl Elph-Mcnc: 0.8 g/dL (ref 0.4–1.0)
B-Globulin SerPl Elph-Mcnc: 0.7 g/dL (ref 0.7–1.3)
Gamma Glob SerPl Elph-Mcnc: 0.7 g/dL (ref 0.4–1.8)
Globulin, Total: 2.4 g/dL (ref 2.2–3.9)
IgA: 354 mg/dL (ref 64–422)
IgG (Immunoglobin G), Serum: 345 mg/dL — ABNORMAL LOW (ref 586–1602)
IgM (Immunoglobulin M), Srm: 30 mg/dL (ref 26–217)
M Protein SerPl Elph-Mcnc: 0.3 g/dL — ABNORMAL HIGH
Total Protein ELP: 5.6 g/dL — ABNORMAL LOW (ref 6.0–8.5)

## 2021-05-31 ENCOUNTER — Telehealth: Payer: Self-pay

## 2021-05-31 MED FILL — Dexamethasone Sodium Phosphate Inj 100 MG/10ML: INTRAMUSCULAR | Qty: 2 | Status: AC

## 2021-05-31 NOTE — Telephone Encounter (Signed)
-----   Message from [name], MD sent at 05/31/2021  8:47 AM EST ----- Regarding: myeloma panel is good Pls call her and let her know results are good

## 2021-05-31 NOTE — Telephone Encounter (Signed)
Called and given below message. She verbalized understanding. 

## 2021-06-01 ENCOUNTER — Inpatient Hospital Stay: Payer: Medicare HMO

## 2021-06-01 ENCOUNTER — Other Ambulatory Visit: Payer: Self-pay

## 2021-06-01 VITALS — BP 149/76 | HR 74 | Temp 98.5°F | Resp 18 | Wt 157.8 lb

## 2021-06-01 DIAGNOSIS — D469 Myelodysplastic syndrome, unspecified: Secondary | ICD-10-CM

## 2021-06-01 DIAGNOSIS — Z5112 Encounter for antineoplastic immunotherapy: Secondary | ICD-10-CM | POA: Diagnosis not present

## 2021-06-01 DIAGNOSIS — C9002 Multiple myeloma in relapse: Secondary | ICD-10-CM

## 2021-06-01 LAB — CMP (CANCER CENTER ONLY)
ALT: 9 U/L (ref 0–44)
AST: 12 U/L — ABNORMAL LOW (ref 15–41)
Albumin: 3.2 g/dL — ABNORMAL LOW (ref 3.5–5.0)
Alkaline Phosphatase: 81 U/L (ref 38–126)
Anion gap: 8 (ref 5–15)
BUN: 22 mg/dL (ref 8–23)
CO2: 25 mmol/L (ref 22–32)
Calcium: 8.5 mg/dL — ABNORMAL LOW (ref 8.9–10.3)
Chloride: 108 mmol/L (ref 98–111)
Creatinine: 0.74 mg/dL (ref 0.44–1.00)
GFR, Estimated: >60 mL/min (ref >60)
Glucose, Bld: 100 mg/dL — ABNORMAL HIGH (ref 70–99)
Potassium: 4.1 mmol/L (ref 3.5–5.1)
Sodium: 141 mmol/L (ref 135–145)
Total Bilirubin: 0.2 mg/dL — ABNORMAL LOW (ref 0.3–1.2)
Total Protein: 6 g/dL — ABNORMAL LOW (ref 6.5–8.1)

## 2021-06-01 LAB — CBC WITH DIFFERENTIAL (CANCER CENTER ONLY)
Abs Immature Granulocytes: 0.01 10*3/uL (ref 0.00–0.07)
Basophils Absolute: 0 10*3/uL (ref 0.0–0.1)
Basophils Relative: 0 %
Eosinophils Absolute: 0 10*3/uL (ref 0.0–0.5)
Eosinophils Relative: 1 %
HCT: 34.2 % — ABNORMAL LOW (ref 36.0–46.0)
Hemoglobin: 11.2 g/dL — ABNORMAL LOW (ref 12.0–15.0)
Immature Granulocytes: 0 %
Lymphocytes Relative: 30 %
Lymphs Abs: 1.9 10*3/uL (ref 0.7–4.0)
MCH: 34.3 pg — ABNORMAL HIGH (ref 26.0–34.0)
MCHC: 32.7 g/dL (ref 30.0–36.0)
MCV: 104.6 fL — ABNORMAL HIGH (ref 80.0–100.0)
Monocytes Absolute: 0.7 10*3/uL (ref 0.1–1.0)
Monocytes Relative: 11 %
Neutro Abs: 3.5 10*3/uL (ref 1.7–7.7)
Neutrophils Relative %: 58 %
Platelet Count: 146 10*3/uL — ABNORMAL LOW (ref 150–400)
RBC: 3.27 MIL/uL — ABNORMAL LOW (ref 3.87–5.11)
RDW: 14.2 % (ref 11.5–15.5)
WBC Count: 6.1 10*3/uL (ref 4.0–10.5)
nRBC: 0 % (ref 0.0–0.2)

## 2021-06-01 MED ORDER — ZOLEDRONIC ACID 4 MG/100ML IV SOLN
4.0000 mg | Freq: Once | INTRAVENOUS | Status: AC
  Administered 2021-06-01: 4 mg via INTRAVENOUS
  Filled 2021-06-01: qty 100

## 2021-06-01 MED ORDER — SODIUM CHLORIDE 0.9% FLUSH
10.0000 mL | INTRAVENOUS | Status: DC | PRN
  Administered 2021-06-01: 10 mL

## 2021-06-01 MED ORDER — SODIUM CHLORIDE 0.9 % IV SOLN
20.0000 mg | Freq: Once | INTRAVENOUS | Status: AC
  Administered 2021-06-01: 20 mg via INTRAVENOUS
  Filled 2021-06-01: qty 20

## 2021-06-01 MED ORDER — SODIUM CHLORIDE 0.9% FLUSH
10.0000 mL | Freq: Once | INTRAVENOUS | Status: AC
  Administered 2021-06-01: 10 mL

## 2021-06-01 MED ORDER — SODIUM CHLORIDE 0.9 % IV SOLN
Freq: Once | INTRAVENOUS | Status: AC
Start: 2021-06-01 — End: 2021-06-01

## 2021-06-01 MED ORDER — HEPARIN SOD (PORK) LOCK FLUSH 100 UNIT/ML IV SOLN
500.0000 [IU] | Freq: Once | INTRAVENOUS | Status: AC | PRN
  Administered 2021-06-01: 500 [IU]

## 2021-06-01 MED ORDER — DEXTROSE 5 % IV SOLN
27.0000 mg/m2 | Freq: Once | INTRAVENOUS | Status: AC
  Administered 2021-06-01: 50 mg via INTRAVENOUS
  Filled 2021-06-01: qty 15

## 2021-06-01 NOTE — Patient Instructions (Signed)
[facility]  Discharge Instructions: Thank you for choosing [facility] to provide your oncology and hematology care.   If you have a lab appointment with the Cancer Center, please go directly to the Cancer Center and check in at the registration area.   Wear comfortable clothing and clothing appropriate for easy access to any Portacath or PICC line.   We strive to give you quality time with your provider. You may need to reschedule your appointment if you arrive late (15 or more minutes).  Arriving late affects you and other patients whose appointments are after yours.  Also, if you miss three or more appointments without notifying the office, you may be dismissed from the clinic at the provider's discretion.      For prescription refill requests, have your pharmacy contact our office and allow 72 hours for refills to be completed.    Today you received the following chemotherapy and/or immunotherapy agents: Kyprolis    To help prevent nausea and vomiting after your treatment, we encourage you to take your nausea medication as directed.  BELOW ARE SYMPTOMS THAT SHOULD BE REPORTED IMMEDIATELY: . *FEVER GREATER THAN 100.4 F (38 C) OR HIGHER . *CHILLS OR SWEATING . *NAUSEA AND VOMITING THAT IS NOT CONTROLLED WITH YOUR NAUSEA MEDICATION . *UNUSUAL SHORTNESS OF BREATH . *UNUSUAL BRUISING OR BLEEDING . *URINARY PROBLEMS (pain or burning when urinating, or frequent urination) . *BOWEL PROBLEMS (unusual diarrhea, constipation, pain near the anus) . TENDERNESS IN MOUTH AND THROAT WITH OR WITHOUT PRESENCE OF ULCERS (sore throat, sores in mouth, or a toothache) . UNUSUAL RASH, SWELLING OR PAIN  . UNUSUAL VAGINAL DISCHARGE OR ITCHING   Items with * indicate a potential emergency and should be followed up as soon as possible or go to the Emergency Department if any problems should occur.  Please show the CHEMOTHERAPY ALERT CARD or IMMUNOTHERAPY ALERT  CARD at check-in to the Emergency Department and triage nurse.  Should you have questions after your visit or need to cancel or reschedule your appointment, please contact [facility]  Dept: [phone]  and follow the prompts.  Office hours are 8:00 a.m. to 4:30 p.m. Monday - Friday. Please note that voicemails left after 4:00 p.m. may not be returned until the following business day.  We are closed weekends and major holidays. You have access to a nurse at all times for urgent questions. Please call the main number to the clinic Dept: [phone] and follow the prompts.   For any non-urgent questions, you may also contact your provider using MyChart. We now offer e-Visits for anyone 18 and older to request care online for non-urgent symptoms. For details visit [email or web address].   Also download the MyChart app! Go to the app store, search "MyChart", open the app, select [facility], and log in with your MyChart username and password.  Due to Covid, a mask is required upon entering the hospital/clinic. If you do not have a mask, one will be given to you upon arrival. For doctor visits, patients may have 1 support person aged 18 or older with them. For treatment visits, patients cannot have anyone with them due to current Covid guidelines and our immunocompromised population.   

## 2021-06-07 MED FILL — Dexamethasone Sodium Phosphate Inj 100 MG/10ML: INTRAMUSCULAR | Qty: 2 | Status: AC

## 2021-06-08 ENCOUNTER — Other Ambulatory Visit: Payer: Self-pay

## 2021-06-08 ENCOUNTER — Inpatient Hospital Stay: Payer: Medicare HMO

## 2021-06-08 VITALS — BP 137/74 | HR 78 | Temp 98.8°F | Resp 18 | Ht 62.5 in | Wt 158.2 lb

## 2021-06-08 DIAGNOSIS — C9002 Multiple myeloma in relapse: Secondary | ICD-10-CM

## 2021-06-08 DIAGNOSIS — D469 Myelodysplastic syndrome, unspecified: Secondary | ICD-10-CM

## 2021-06-08 DIAGNOSIS — Z5112 Encounter for antineoplastic immunotherapy: Secondary | ICD-10-CM | POA: Diagnosis not present

## 2021-06-08 LAB — CMP (CANCER CENTER ONLY)
ALT: 7 U/L (ref 0–44)
AST: 9 U/L — ABNORMAL LOW (ref 15–41)
Albumin: 3.2 g/dL — ABNORMAL LOW (ref 3.5–5.0)
Alkaline Phosphatase: 82 U/L (ref 38–126)
Anion gap: 8 (ref 5–15)
BUN: 16 mg/dL (ref 8–23)
CO2: 26 mmol/L (ref 22–32)
Calcium: 8.9 mg/dL (ref 8.9–10.3)
Chloride: 107 mmol/L (ref 98–111)
Creatinine: 0.71 mg/dL (ref 0.44–1.00)
GFR, Estimated: >60 mL/min (ref >60)
Glucose, Bld: 97 mg/dL (ref 70–99)
Potassium: 4.2 mmol/L (ref 3.5–5.1)
Sodium: 141 mmol/L (ref 135–145)
Total Bilirubin: 0.3 mg/dL (ref 0.3–1.2)
Total Protein: 6.2 g/dL — ABNORMAL LOW (ref 6.5–8.1)

## 2021-06-08 LAB — CBC WITH DIFFERENTIAL (CANCER CENTER ONLY)
Abs Immature Granulocytes: 0.01 10*3/uL (ref 0.00–0.07)
Basophils Absolute: 0 10*3/uL (ref 0.0–0.1)
Basophils Relative: 0 %
Eosinophils Absolute: 0 10*3/uL (ref 0.0–0.5)
Eosinophils Relative: 0 %
HCT: 31.5 % — ABNORMAL LOW (ref 36.0–46.0)
Hemoglobin: 10.4 g/dL — ABNORMAL LOW (ref 12.0–15.0)
Immature Granulocytes: 0 %
Lymphocytes Relative: 20 %
Lymphs Abs: 1.5 10*3/uL (ref 0.7–4.0)
MCH: 35 pg — ABNORMAL HIGH (ref 26.0–34.0)
MCHC: 33 g/dL (ref 30.0–36.0)
MCV: 106.1 fL — ABNORMAL HIGH (ref 80.0–100.0)
Monocytes Absolute: 0.7 10*3/uL (ref 0.1–1.0)
Monocytes Relative: 9 %
Neutro Abs: 5.6 10*3/uL (ref 1.7–7.7)
Neutrophils Relative %: 71 %
Platelet Count: 161 10*3/uL (ref 150–400)
RBC: 2.97 MIL/uL — ABNORMAL LOW (ref 3.87–5.11)
RDW: 14 % (ref 11.5–15.5)
WBC Count: 7.8 10*3/uL (ref 4.0–10.5)
nRBC: 0 % (ref 0.0–0.2)

## 2021-06-08 MED ORDER — DEXTROSE 5 % IV SOLN
27.0000 mg/m2 | Freq: Once | INTRAVENOUS | Status: AC
  Administered 2021-06-08: 10:00:00 50 mg via INTRAVENOUS
  Filled 2021-06-08: qty 15

## 2021-06-08 MED ORDER — SODIUM CHLORIDE 0.9% FLUSH
10.0000 mL | Freq: Once | INTRAVENOUS | Status: AC
  Administered 2021-06-08: 08:00:00 10 mL

## 2021-06-08 MED ORDER — SODIUM CHLORIDE 0.9% FLUSH
10.0000 mL | INTRAVENOUS | Status: DC | PRN
  Administered 2021-06-08: 10:00:00 10 mL

## 2021-06-08 MED ORDER — SODIUM CHLORIDE 0.9 % IV SOLN: Freq: Once | INTRAVENOUS | Status: AC

## 2021-06-08 MED ORDER — HEPARIN SOD (PORK) LOCK FLUSH 100 UNIT/ML IV SOLN
500.0000 [IU] | Freq: Once | INTRAVENOUS | Status: AC | PRN
  Administered 2021-06-08: 10:00:00 500 [IU]

## 2021-06-08 MED ORDER — SODIUM CHLORIDE 0.9 % IV SOLN
20.0000 mg | Freq: Once | INTRAVENOUS | Status: AC
  Administered 2021-06-08: 09:00:00 20 mg via INTRAVENOUS
  Filled 2021-06-08: qty 20

## 2021-06-08 NOTE — Patient Instructions (Signed)
[facility]   Discharge Instructions: Thank you for choosing [facility] to provide your oncology and hematology care.   If you have a lab appointment with the Cancer Center, please go directly to the Cancer Center and check in at the registration area.   Wear comfortable clothing and clothing appropriate for easy access to any Portacath or PICC line.   We strive to give you quality time with your provider. You may need to reschedule your appointment if you arrive late (15 or more minutes).  Arriving late affects you and other patients whose appointments are after yours.  Also, if you miss three or more appointments without notifying the office, you may be dismissed from the clinic at the provider's discretion.      For prescription refill requests, have your pharmacy contact our office and allow 72 hours for refills to be completed.    Today you received the following chemotherapy and/or immunotherapy agents: Carfilzomib (Kyprolis)     To help prevent nausea and vomiting after your treatment, we encourage you to take your nausea medication as directed.  BELOW ARE SYMPTOMS THAT SHOULD BE REPORTED IMMEDIATELY: *FEVER GREATER THAN 100.4 F (38 C) OR HIGHER *CHILLS OR SWEATING *NAUSEA AND VOMITING THAT IS NOT CONTROLLED WITH YOUR NAUSEA MEDICATION *UNUSUAL SHORTNESS OF BREATH *UNUSUAL BRUISING OR BLEEDING *URINARY PROBLEMS (pain or burning when urinating, or frequent urination) *BOWEL PROBLEMS (unusual diarrhea, constipation, pain near the anus) TENDERNESS IN MOUTH AND THROAT WITH OR WITHOUT PRESENCE OF ULCERS (sore throat, sores in mouth, or a toothache) UNUSUAL RASH, SWELLING OR PAIN  UNUSUAL VAGINAL DISCHARGE OR ITCHING   Items with * indicate a potential emergency and should be followed up as soon as possible or go to the Emergency Department if any problems should occur.  Please show the CHEMOTHERAPY ALERT CARD or IMMUNOTHERAPY ALERT CARD  at check-in to the Emergency Department and triage nurse.  Should you have questions after your visit or need to cancel or reschedule your appointment, please contact [facility]  Dept: [phone]  and follow the prompts.  Office hours are 8:00 a.m. to 4:30 p.m. Monday - Friday. Please note that voicemails left after 4:00 p.m. may not be returned until the following business day.  We are closed weekends and major holidays. You have access to a nurse at all times for urgent questions. Please call the main number to the clinic Dept: [phone] and follow the prompts.   For any non-urgent questions, you may also contact your provider using MyChart. We now offer e-Visits for anyone 18 and older to request care online for non-urgent symptoms. For details visit [email or web address].   Also download the MyChart app! Go to the app store, search "MyChart", open the app, select , and log in with your MyChart username and password.  Due to Covid, a mask is required upon entering the hospital/clinic. If you do not have a mask, one will be given to you upon arrival. For doctor visits, patients may have 1 support person aged 18 or older with them. For treatment visits, patients cannot have anyone with them due to current Covid guidelines and our immunocompromised population.  

## 2021-06-13 ENCOUNTER — Telehealth: Payer: Self-pay

## 2021-06-13 ENCOUNTER — Other Ambulatory Visit: Payer: Self-pay | Admitting: Hematology and Oncology

## 2021-06-13 DIAGNOSIS — J329 Chronic sinusitis, unspecified: Secondary | ICD-10-CM | POA: Insufficient documentation

## 2021-06-13 DIAGNOSIS — J018 Other acute sinusitis: Secondary | ICD-10-CM

## 2021-06-13 MED ORDER — AZITHROMYCIN 500 MG PO TABS: 500.0000 mg | ORAL_TABLET | Freq: Every day | ORAL | 0 refills | Status: DC

## 2021-06-13 NOTE — Telephone Encounter (Signed)
Called and given below message. She verbalized understanding. 

## 2021-06-13 NOTE — Telephone Encounter (Signed)
She called and left a message. For 2 weeks she has had a head cold and chest cold. She has nasal congestion and deep cough. She has been taking over the counter medications.  She is asking for a antibiotic or what do you suggest. She is concerned for the holiday.

## 2021-06-13 NOTE — Telephone Encounter (Signed)
Majority of these symptoms are viral in nature, meaning, even with antibiotics she can still have symptoms I will prescribe 3 days high dose azithromycin, if she is not better, she needs to go to urgent care

## 2021-06-14 ENCOUNTER — Telehealth: Payer: Self-pay

## 2021-06-14 NOTE — Telephone Encounter (Signed)
She called and left a message. She had to stop the antibiotic, Zithromax yesterday due to expositive diarrhea. She is asking if you have another antibiotic option?

## 2021-06-14 NOTE — Telephone Encounter (Signed)
Called and given below message. She will try probiotic to see if it helps.

## 2021-06-14 NOTE — Telephone Encounter (Signed)
All antibiotics can cause diarrhea

## 2021-06-24 ENCOUNTER — Inpatient Hospital Stay: Payer: Medicare HMO

## 2021-06-24 ENCOUNTER — Inpatient Hospital Stay (HOSPITAL_BASED_OUTPATIENT_CLINIC_OR_DEPARTMENT_OTHER): Payer: Medicare HMO | Admitting: Hematology and Oncology

## 2021-06-24 ENCOUNTER — Other Ambulatory Visit: Payer: Self-pay

## 2021-06-24 ENCOUNTER — Encounter: Payer: Self-pay | Admitting: Hematology and Oncology

## 2021-06-24 DIAGNOSIS — T451X5A Adverse effect of antineoplastic and immunosuppressive drugs, initial encounter: Secondary | ICD-10-CM | POA: Diagnosis not present

## 2021-06-24 DIAGNOSIS — D6481 Anemia due to antineoplastic chemotherapy: Secondary | ICD-10-CM

## 2021-06-24 DIAGNOSIS — D469 Myelodysplastic syndrome, unspecified: Secondary | ICD-10-CM

## 2021-06-24 DIAGNOSIS — C9002 Multiple myeloma in relapse: Secondary | ICD-10-CM

## 2021-06-24 DIAGNOSIS — C946 Myelodysplastic disease, not classified: Secondary | ICD-10-CM

## 2021-06-24 DIAGNOSIS — Z5112 Encounter for antineoplastic immunotherapy: Secondary | ICD-10-CM | POA: Diagnosis not present

## 2021-06-24 LAB — CBC WITH DIFFERENTIAL (CANCER CENTER ONLY)
Abs Immature Granulocytes: 0 10*3/uL (ref 0.00–0.07)
Basophils Absolute: 0 10*3/uL (ref 0.0–0.1)
Basophils Relative: 0 %
Eosinophils Absolute: 0 10*3/uL (ref 0.0–0.5)
Eosinophils Relative: 0 %
HCT: 33.5 % — ABNORMAL LOW (ref 36.0–46.0)
Hemoglobin: 10.6 g/dL — ABNORMAL LOW (ref 12.0–15.0)
Immature Granulocytes: 0 %
Lymphocytes Relative: 29 %
Lymphs Abs: 1.5 10*3/uL (ref 0.7–4.0)
MCH: 33.4 pg (ref 26.0–34.0)
MCHC: 31.6 g/dL (ref 30.0–36.0)
MCV: 105.7 fL — ABNORMAL HIGH (ref 80.0–100.0)
Monocytes Absolute: 0.6 10*3/uL (ref 0.1–1.0)
Monocytes Relative: 11 %
Neutro Abs: 3 10*3/uL (ref 1.7–7.7)
Neutrophils Relative %: 60 %
Platelet Count: 175 10*3/uL (ref 150–400)
RBC: 3.17 MIL/uL — ABNORMAL LOW (ref 3.87–5.11)
RDW: 13.6 % (ref 11.5–15.5)
WBC Count: 5 10*3/uL (ref 4.0–10.5)
nRBC: 0 % (ref 0.0–0.2)

## 2021-06-24 LAB — CMP (CANCER CENTER ONLY)
ALT: 12 U/L (ref 0–44)
AST: 15 U/L (ref 15–41)
Albumin: 3.6 g/dL (ref 3.5–5.0)
Alkaline Phosphatase: 66 U/L (ref 38–126)
Anion gap: 6 (ref 5–15)
BUN: 21 mg/dL (ref 8–23)
CO2: 26 mmol/L (ref 22–32)
Calcium: 8.5 mg/dL — ABNORMAL LOW (ref 8.9–10.3)
Chloride: 108 mmol/L (ref 98–111)
Creatinine: 0.66 mg/dL (ref 0.44–1.00)
GFR, Estimated: >60 mL/min (ref >60)
Glucose, Bld: 93 mg/dL (ref 70–99)
Potassium: 4.1 mmol/L (ref 3.5–5.1)
Sodium: 140 mmol/L (ref 135–145)
Total Bilirubin: 0.2 mg/dL — ABNORMAL LOW (ref 0.3–1.2)
Total Protein: 6 g/dL — ABNORMAL LOW (ref 6.5–8.1)

## 2021-06-24 MED ORDER — HEPARIN SOD (PORK) LOCK FLUSH 100 UNIT/ML IV SOLN: 500.0000 [IU] | Freq: Once | INTRAVENOUS | Status: DC | PRN

## 2021-06-24 MED ORDER — DEXTROSE 5 % IV SOLN
27.0000 mg/m2 | Freq: Once | INTRAVENOUS | Status: AC
  Administered 2021-06-24: 11:00:00 50 mg via INTRAVENOUS
  Filled 2021-06-24: qty 15

## 2021-06-24 MED ORDER — SODIUM CHLORIDE 0.9% FLUSH
10.0000 mL | Freq: Once | INTRAVENOUS | Status: AC
  Administered 2021-06-24: 08:00:00 10 mL

## 2021-06-24 MED ORDER — SODIUM CHLORIDE 0.9 % IV SOLN: Freq: Once | INTRAVENOUS | Status: AC

## 2021-06-24 MED ORDER — SODIUM CHLORIDE 0.9% FLUSH: 10.0000 mL | INTRAVENOUS | Status: DC | PRN

## 2021-06-24 MED ORDER — SODIUM CHLORIDE 0.9 % IV SOLN
20.0000 mg | Freq: Once | INTRAVENOUS | Status: AC
  Administered 2021-06-24: 10:00:00 20 mg via INTRAVENOUS
  Filled 2021-06-24: qty 20

## 2021-06-24 NOTE — Patient Instructions (Signed)
[facility]   Discharge Instructions: Thank you for choosing [facility] to provide your oncology and hematology care.   If you have a lab appointment with the Cancer Center, please go directly to the Cancer Center and check in at the registration area.   Wear comfortable clothing and clothing appropriate for easy access to any Portacath or PICC line.   We strive to give you quality time with your provider. You may need to reschedule your appointment if you arrive late (15 or more minutes).  Arriving late affects you and other patients whose appointments are after yours.  Also, if you miss three or more appointments without notifying the office, you may be dismissed from the clinic at the provider's discretion.      For prescription refill requests, have your pharmacy contact our office and allow 72 hours for refills to be completed.    Today you received the following chemotherapy and/or immunotherapy agents: Carfilzomib (Kyprolis)     To help prevent nausea and vomiting after your treatment, we encourage you to take your nausea medication as directed.  BELOW ARE SYMPTOMS THAT SHOULD BE REPORTED IMMEDIATELY: *FEVER GREATER THAN 100.4 F (38 C) OR HIGHER *CHILLS OR SWEATING *NAUSEA AND VOMITING THAT IS NOT CONTROLLED WITH YOUR NAUSEA MEDICATION *UNUSUAL SHORTNESS OF BREATH *UNUSUAL BRUISING OR BLEEDING *URINARY PROBLEMS (pain or burning when urinating, or frequent urination) *BOWEL PROBLEMS (unusual diarrhea, constipation, pain near the anus) TENDERNESS IN MOUTH AND THROAT WITH OR WITHOUT PRESENCE OF ULCERS (sore throat, sores in mouth, or a toothache) UNUSUAL RASH, SWELLING OR PAIN  UNUSUAL VAGINAL DISCHARGE OR ITCHING   Items with * indicate a potential emergency and should be followed up as soon as possible or go to the Emergency Department if any problems should occur.  Please show the CHEMOTHERAPY ALERT CARD or IMMUNOTHERAPY ALERT CARD  at check-in to the Emergency Department and triage nurse.  Should you have questions after your visit or need to cancel or reschedule your appointment, please contact [facility]  Dept: [phone]  and follow the prompts.  Office hours are 8:00 a.m. to 4:30 p.m. Monday - Friday. Please note that voicemails left after 4:00 p.m. may not be returned until the following business day.  We are closed weekends and major holidays. You have access to a nurse at all times for urgent questions. Please call the main number to the clinic Dept: [phone] and follow the prompts.   For any non-urgent questions, you may also contact your provider using MyChart. We now offer e-Visits for anyone 18 and older to request care online for non-urgent symptoms. For details visit [email or web address].   Also download the MyChart app! Go to the app store, search "MyChart", open the app, select [facility], and log in with your MyChart username and password.  Due to Covid, a mask is required upon entering the hospital/clinic. If you do not have a mask, one will be given to you upon arrival. For doctor visits, patients may have 1 support person aged 18 or older with them. For treatment visits, patients cannot have anyone with them due to current Covid guidelines and our immunocompromised population.  

## 2021-06-24 NOTE — Assessment & Plan Note (Signed)
This is likely due to recent treatment. The patient denies recent history of bleeding such as epistaxis, hematuria or hematochezia. She is asymptomatic from the anemia. I will observe for now.   

## 2021-06-24 NOTE — Assessment & Plan Note (Signed)
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

## 2021-06-24 NOTE — Progress Notes (Signed)
[facility] OFFICE PROGRESS NOTE  Patient Care Team: [name], [name], MD as PCP - General (Internal Medicine) [name], MD as PCP - Cardiology (Cardiology) [name], MD as Referring Physician (Hematology and Oncology) [name], MD as Consulting Physician (Internal Medicine) [name], [name], MD as Consulting Physician (Infectious Diseases) [name], MD as Consulting Physician (Hematology and Oncology) [name], NP as Nurse Practitioner (Hematology and Oncology) [name], MD as Consulting Physician (Hematology and Oncology)  ASSESSMENT & PLAN:  Multiple myeloma in relapse [facility]) Her recent myeloma panel show positive response to treatment At this point in time, I see no reason to add Cytoxan I would like to start dexamethasone taper in the future She will continue calcium and vitamin D supplement and acyclovir for antimicrobial prophylaxis She sees dentist on a regular basis every 3 months with no dental issues She will continue Zometa every 3 months  Anemia due to antineoplastic chemotherapy This is likely due to recent treatment. The patient denies recent history of bleeding such as epistaxis, hematuria or hematochezia. She is asymptomatic from the anemia. I will observe for now.   No orders of the defined types were placed in this encounter.   All questions were answered. The patient knows to call the clinic with any problems, questions or concerns. The total time spent in the appointment was 20 minutes encounter with patients including review of chart and various tests results, discussions about plan of care and coordination of care plan   [name], MD 06/24/2021 5:34 PM  INTERVAL HISTORY: Please see below for problem oriented charting. she returns for treatment follow-up on carfilzomib and dexamethasone for recurrent multiple myeloma She is doing well No recent side effects Appetite is  stable The patient denies any recent signs or symptoms of bleeding such as spontaneous epistaxis, hematuria or hematochezia.   REVIEW OF SYSTEMS:   Constitutional: Denies fevers, chills or abnormal weight loss Eyes: Denies blurriness of vision Ears, nose, mouth, throat, and face: Denies mucositis or sore throat Respiratory: Denies cough, dyspnea or wheezes Cardiovascular: Denies palpitation, chest discomfort or lower extremity swelling Gastrointestinal:  Denies nausea, heartburn or change in bowel habits Skin: Denies abnormal skin rashes Lymphatics: Denies new lymphadenopathy or easy bruising Neurological:Denies numbness, tingling or new weaknesses Behavioral/Psych: Mood is stable, no new changes  All other systems were reviewed with the patient and are negative.  I have reviewed the past medical history, past surgical history, social history and family history with the patient and they are unchanged from previous note.  ALLERGIES:  has No Known Allergies.  MEDICATIONS:  Current Outpatient Medications  Medication Sig Dispense Refill   acyclovir (ZOVIRAX) 400 MG tablet Take 1 tablet (400 mg total) by mouth daily. 30 tablet 11   aspirin 325 MG tablet Take 325 mg by mouth daily.     azithromycin (ZITHROMAX) 500 MG tablet Take 1 tablet (500 mg total) by mouth daily. 3 tablet 0   calcium carbonate (TUMS - DOSED IN MG ELEMENTAL CALCIUM) 500 MG chewable tablet Chew 1 tablet by mouth daily.     Carfilzomib (KYPROLIS IV) Inject into the vein once a week.     Cholecalciferol 25 MCG (1000 UT) tablet Take 1,000 Units by mouth daily.     Cyanocobalamin (VITAMIN B12 PO) Take 1 tablet by mouth daily.     loperamide (IMODIUM) 2 MG capsule Take by mouth as needed for diarrhea or loose stools. As needed only  metoprolol tartrate (LOPRESSOR) 25 MG tablet TAKE 1.5 TABLETS (37.5 MG TOTAL) BY MOUTH 2 (TWO) TIMES DAILY. 270 tablet 1   mirtazapine (REMERON) 15 MG tablet TAKE 1 TABLET BY MOUTH EVERYDAY AT  BEDTIME 90 tablet 1   Multiple Vitamin (MULTIVITAMIN WITH MINERALS) TABS tablet Take 1 tablet by mouth daily.     Multiple Vitamins-Minerals (PRESERVISION AREDS 2 PO) Take 1 tablet by mouth 2 (two) times daily.     ondansetron (ZOFRAN) 8 MG tablet Take 1 tablet (8 mg total) by mouth every 8 (eight) hours as needed (Nausea or vomiting). 30 tablet 1   pantoprazole (PROTONIX) 40 MG tablet Take 1 tablet (40 mg total) by mouth 2 (two) times daily before a meal. 60 tablet 5   prochlorperazine (COMPAZINE) 10 MG tablet Take 1 tablet (10 mg total) by mouth every 6 (six) hours as needed (Nausea or vomiting). 30 tablet 1   No current facility-administered medications for this visit.    SUMMARY OF ONCOLOGIC HISTORY: Oncology History Overview Note  Multiple myeloma, Ig A Lambda, M spike 3.54 grams, Calcium 9.2, Creatinine 0.8, Beta 2 microglobulin 4.52, IgA 4840 mg/dL, lambda light chain 75.4, albumin 3.6, hemoglobin 9.7, platelet 115    Primary site: Multiple Myeloma   Staging method: AJCC 6th Edition   Clinical: Stage IIA signed by [name], MD on 11/07/2013  2:46 PM   Summary: Stage IIA S/p Allo transplant at [facility] on revlimid, pomalyst, daratumumab and velcade     Multiple myeloma in relapse ([place])  10/31/2013 Bone Marrow Biopsy   Bone marrow biopsy confirmed multiple myeloma with 40% bone marrow involvement. Skeletal survey showed minimal lesions in her score with generalized demineralization   11/10/2013 - 02/13/2014 Chemotherapy   The patient is started on induction chemotherapy with weekly dexamethasone 40 mg by mouth as well as Velcade subcutaneous injection on days 1, 4, 8 and 11. On 11/21/2013, she was started on monthly Zometa.   12/23/2013 Adverse Reaction   The dose of Velcade was reduced due to thrombocytopenia.   01/28/2014 - 04/07/2014 Chemotherapy   Revlimid is added. Treatment was discontinued due to lack of response.   02/24/2014 - 04/07/2014 Chemotherapy   Due to  worsening peripheral neuropathy, Velcade injection is changed to once a week. Revlimid was given 21 days on, 7 days off.   04/07/2014 - 04/10/2014 Chemotherapy   Revlimid was discontinued due to lack of response. Chemotherapy was changed back to Velcade injection twice a week, 2 weeks on 1 week off. Her treatment was switched to to minimum response   04/20/2014 - 06/02/2014 Chemotherapy   chemotherapy is switched to Carfilzomib, Cytoxan and dexamethasone.   04/22/2014 Procedure   she has placement of port for chemotherapy.   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 at [facility]   10/20/2014 - 03/24/2015 Chemotherapy    she received chemotherapy with Kyprolis, Revlimid and dexamethasone   10/22/2014 Procedure   She has port placement   01/19/2015 Tumor Marker   IgA lambda M spike at 0.4 g    01/20/2015 Miscellaneous   IVIG monthly was added for  recurrent infections   02/02/2015 Miscellaneous   She received GCSF for severe neutropenia   02/26/2015 Bone Marrow Biopsy    she had bone marrow biopsy done at [facility] which showed mild pancytopenia but not diagnostic for myelodysplastic syndrome or multiple myeloma   07/22/2015 - 09/21/2015 Chemotherapy   She is receiving Daratumumab at [facility] due to relapsed myeloma   08/03/2015 - 08/06/2015 Hospital Admission   She was admitted to the hospital for neutropenic fever. No cource was found and fever resolved with IV vancomycin and meropenem   09/13/2015 Bone Marrow Biopsy   Bone marrow biopsy showed no increased blasts, 3-4 % plasma cells   03/02/2016 Bone Marrow Biopsy    Bone marrow biopsy at [facility] showed normocellular (30%) bone marrow with trilineage hematopoiesis. No significant increase in blasts. No significant increase in plasma cells.   05/12/2016 Imaging   DEXA scan at [facility] showed osteopenia   10/24/2016 Imaging   Skeletal survey at [facility], no new lesions   12/07/2017 Imaging   No focal abnormality noted to suggest myeloma. Exam is stable from prior exam.   03/01/2018 Procedure   Successful 8 French right internal jugular vein power port placement with its tip at the SVC/RA junction.   03/06/2018 -  Chemotherapy   The patient had daratumumab    07/09/2018 Bone Marrow Biopsy   Bone marrow biopsy at [facility] showed residual disease at 0.004% plasma cells   07/03/2019 Bone Marrow Biopsy   A. Bone marrow, flow cytometric analysis for multiple myeloma minimal residual disease detection:   Negative. No phenotypically abnormal plasma cells at or above the limit of detection identified.     No monotypic B-cell population identified. Negative for increased blasts.   03/25/2021 Bone Marrow Biopsy   Repeat bone marrow biopsy at [place] showed female donor karyotype.  5 to 7% plasma cell is seen.  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   04/20/2021 Echocardiogram   1. Left ventricular ejection fraction, by estimation, is 60 to 65%. The left ventricle has normal function. The left ventricle has no regional wall motion abnormalities. Left ventricular diastolic parameters were normal.  2. Right ventricular systolic function is normal. The right ventricular size is normal.  3. The mitral valve is normal in structure. No evidence of mitral valve regurgitation. No evidence of mitral stenosis.  4. The aortic valve is normal in structure. Aortic valve regurgitation is not visualized. No aortic stenosis is present.   5. The inferior vena cava is normal in size with greater than 50% respiratory variability, suggesting right atrial pressure of 3 mmHg.   04/29/2021 -  Chemotherapy   Patient is on Treatment Plan : MYELOMA RELAPSED/ REFRACTORY Carfilzomib D1,8,15 (20/27) + Dexamethasone (KPd) q28d     05/04/2021 PET scan   1. No evidence active multiple myeloma within the skeleton on FDG PET scan. 2. No suspicious lytic lesion on CT portion exam. 3. No plasmacytoma.   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   10/31/2015 Miscellaneous   She has engrafted     PHYSICAL EXAMINATION: ECOG PERFORMANCE STATUS: 1 - Symptomatic but completely ambulatory  Vitals:   06/24/21 0820  BP: (!) 145/90  Pulse: 86  Resp: 18  Temp: (!) 97.4 F (36.3 C)  SpO2: 99%   Filed Weights   06/24/21 0820  Weight: 158 lb 12.8 oz (72 kg)    GENERAL:alert, no distress and comfortable SKIN: skin color, texture, turgor are normal, no rashes or significant lesions EYES: normal, Conjunctiva are pink and non-injected, sclera clear OROPHARYNX:no exudate, no erythema and lips, buccal mucosa, and tongue normal  NECK: supple, thyroid normal size, non-tender, without nodularity LYMPH:  no palpable lymphadenopathy in the cervical, axillary or inguinal LUNGS: clear to auscultation and percussion with normal breathing effort HEART: regular rate &  rhythm and no murmurs and no lower extremity edema ABDOMEN:abdomen soft, non-tender and normal bowel sounds Musculoskeletal:no cyanosis of digits and no clubbing  NEURO: alert & oriented x 3 with fluent speech, no focal motor/sensory deficits  LABORATORY DATA:  I have reviewed the data as listed    Component Value Date/Time   NA 140 06/24/2021 0755   NA 142 01/04/2017 0902   K 4.1 06/24/2021 0755   K 3.9 01/04/2017 0902   CL 108 06/24/2021 0755   CO2 26 06/24/2021 0755   CO2 28 01/04/2017 0902   GLUCOSE 93 06/24/2021 0755   GLUCOSE 92 01/04/2017 0902   BUN 21 06/24/2021 0755   BUN 9.5 01/04/2017 0902   CREATININE 0.66 06/24/2021 0755   CREATININE 0.7 01/04/2017 0902   CALCIUM 8.5 (L) 06/24/2021 0755   CALCIUM 9.0 01/04/2017 0902   PROT 6.0 (L) 06/24/2021 0755   PROT 6.0 (L) 01/04/2017 0902   PROT 5.8 (L) 01/04/2017 0902   ALBUMIN 3.6 06/24/2021 0755   ALBUMIN 3.4 (L) 01/04/2017 0902   AST 15 06/24/2021 0755   AST 21 01/04/2017 0902   ALT 12 06/24/2021 0755   ALT 16 01/04/2017 0902   ALKPHOS 66 06/24/2021 0755   ALKPHOS 101 01/04/2017 0902   BILITOT 0.2 (L) 06/24/2021 0755   BILITOT 0.56 01/04/2017 0902   GFRNONAA >60 06/24/2021 0755   GFRAA >60 03/25/2020 0917   GFRAA >60 12/18/2018 0901    No results found for: SPEP, UPEP  Lab Results  Component Value Date   WBC 5.0 06/24/2021   NEUTROABS 3.0 06/24/2021   HGB 10.6 (L) 06/24/2021   HCT 33.5 (L) 06/24/2021   MCV 105.7 (H) 06/24/2021   PLT 175 06/24/2021      Chemistry      Component Value Date/Time   NA 140 06/24/2021 0755   NA 142 01/04/2017 0902   K 4.1 06/24/2021 0755   K 3.9 01/04/2017 0902   CL 108 06/24/2021 0755   CO2 26 06/24/2021 0755   CO2 28 01/04/2017 0902   BUN 21 06/24/2021 0755   BUN 9.5 01/04/2017 0902   CREATININE 0.66 06/24/2021 0755   CREATININE 0.7 01/04/2017 0902      Component Value Date/Time   CALCIUM 8.5 (L) 06/24/2021 0755   CALCIUM 9.0 01/04/2017 0902   ALKPHOS 66  06/24/2021 0755   ALKPHOS 101 01/04/2017 0902   AST 15 06/24/2021 0755   AST 21 01/04/2017 0902   ALT 12 06/24/2021 0755   ALT 16 01/04/2017 0902   BILITOT 0.2 (  L) 06/24/2021 0755   BILITOT 0.56 01/04/2017 0902

## 2021-06-29 ENCOUNTER — Telehealth: Payer: Self-pay | Admitting: Hematology and Oncology

## 2021-06-29 NOTE — Telephone Encounter (Signed)
Rescheduled appointment per melanie. Patient aware.

## 2021-06-30 MED FILL — Dexamethasone Sodium Phosphate Inj 100 MG/10ML: INTRAMUSCULAR | Qty: 2 | Status: AC

## 2021-07-01 ENCOUNTER — Inpatient Hospital Stay: Payer: Medicare HMO

## 2021-07-01 ENCOUNTER — Other Ambulatory Visit: Payer: Self-pay

## 2021-07-01 ENCOUNTER — Inpatient Hospital Stay: Payer: Medicare HMO | Attending: Hematology and Oncology

## 2021-07-01 VITALS — BP 150/55 | HR 63 | Temp 98.3°F | Resp 17 | Ht 62.5 in | Wt 158.5 lb

## 2021-07-01 DIAGNOSIS — D6481 Anemia due to antineoplastic chemotherapy: Secondary | ICD-10-CM | POA: Insufficient documentation

## 2021-07-01 DIAGNOSIS — D469 Myelodysplastic syndrome, unspecified: Secondary | ICD-10-CM

## 2021-07-01 DIAGNOSIS — C9002 Multiple myeloma in relapse: Secondary | ICD-10-CM

## 2021-07-01 DIAGNOSIS — Z5112 Encounter for antineoplastic immunotherapy: Secondary | ICD-10-CM | POA: Insufficient documentation

## 2021-07-01 LAB — CBC WITH DIFFERENTIAL (CANCER CENTER ONLY)
Abs Immature Granulocytes: 0.01 10*3/uL (ref 0.00–0.07)
Basophils Absolute: 0 10*3/uL (ref 0.0–0.1)
Basophils Relative: 0 %
Eosinophils Absolute: 0 10*3/uL (ref 0.0–0.5)
Eosinophils Relative: 0 %
HCT: 34.4 % — ABNORMAL LOW (ref 36.0–46.0)
Hemoglobin: 11 g/dL — ABNORMAL LOW (ref 12.0–15.0)
Immature Granulocytes: 0 %
Lymphocytes Relative: 31 %
Lymphs Abs: 2 10*3/uL (ref 0.7–4.0)
MCH: 33.7 pg (ref 26.0–34.0)
MCHC: 32 g/dL (ref 30.0–36.0)
MCV: 105.5 fL — ABNORMAL HIGH (ref 80.0–100.0)
Monocytes Absolute: 0.7 10*3/uL (ref 0.1–1.0)
Monocytes Relative: 11 %
Neutro Abs: 3.7 10*3/uL (ref 1.7–7.7)
Neutrophils Relative %: 58 %
Platelet Count: 180 10*3/uL (ref 150–400)
RBC: 3.26 MIL/uL — ABNORMAL LOW (ref 3.87–5.11)
RDW: 13.8 % (ref 11.5–15.5)
WBC Count: 6.4 10*3/uL (ref 4.0–10.5)
nRBC: 0 % (ref 0.0–0.2)

## 2021-07-01 LAB — CMP (CANCER CENTER ONLY)
ALT: 10 U/L (ref 0–44)
AST: 12 U/L — ABNORMAL LOW (ref 15–41)
Albumin: 3.7 g/dL (ref 3.5–5.0)
Alkaline Phosphatase: 75 U/L (ref 38–126)
Anion gap: 5 (ref 5–15)
BUN: 19 mg/dL (ref 8–23)
CO2: 28 mmol/L (ref 22–32)
Calcium: 8.9 mg/dL (ref 8.9–10.3)
Chloride: 105 mmol/L (ref 98–111)
Creatinine: 0.67 mg/dL (ref 0.44–1.00)
GFR, Estimated: >60 mL/min (ref >60)
Glucose, Bld: 91 mg/dL (ref 70–99)
Potassium: 4.3 mmol/L (ref 3.5–5.1)
Sodium: 138 mmol/L (ref 135–145)
Total Bilirubin: 0.2 mg/dL — ABNORMAL LOW (ref 0.3–1.2)
Total Protein: 6.3 g/dL — ABNORMAL LOW (ref 6.5–8.1)

## 2021-07-01 MED ORDER — HEPARIN SOD (PORK) LOCK FLUSH 100 UNIT/ML IV SOLN
500.0000 [IU] | Freq: Once | INTRAVENOUS | Status: AC | PRN
  Administered 2021-07-01: 500 [IU]

## 2021-07-01 MED ORDER — DEXTROSE 5 % IV SOLN
27.0000 mg/m2 | Freq: Once | INTRAVENOUS | Status: AC
  Administered 2021-07-01: 50 mg via INTRAVENOUS
  Filled 2021-07-01: qty 15

## 2021-07-01 MED ORDER — SODIUM CHLORIDE 0.9% FLUSH
10.0000 mL | Freq: Once | INTRAVENOUS | Status: AC
  Administered 2021-07-01: 10 mL

## 2021-07-01 MED ORDER — SODIUM CHLORIDE 0.9% FLUSH
10.0000 mL | INTRAVENOUS | Status: DC | PRN
  Administered 2021-07-01: 10 mL

## 2021-07-01 MED ORDER — SODIUM CHLORIDE 0.9 % IV SOLN: Freq: Once | INTRAVENOUS | Status: AC

## 2021-07-01 MED ORDER — SODIUM CHLORIDE 0.9 % IV SOLN
20.0000 mg | Freq: Once | INTRAVENOUS | Status: AC
  Administered 2021-07-01: 20 mg via INTRAVENOUS
  Filled 2021-07-01: qty 20

## 2021-07-01 NOTE — Patient Instructions (Signed)
[facility] MEDICAL ONCOLOGY  Discharge Instructions: Thank you for choosing [facility] to provide your oncology and hematology care.   If you have a lab appointment with the Cancer Center, please go directly to the Cancer Center and check in at the registration area.   Wear comfortable clothing and clothing appropriate for easy access to any Portacath or PICC line.   We strive to give you quality time with your provider. You may need to reschedule your appointment if you arrive late (15 or more minutes).  Arriving late affects you and other patients whose appointments are after yours.  Also, if you miss three or more appointments without notifying the office, you may be dismissed from the clinic at the provider's discretion.      For prescription refill requests, have your pharmacy contact our office and allow 72 hours for refills to be completed.    Today you received the following chemotherapy and/or immunotherapy agent: Carfilzomib (Kyprolis).    To help prevent nausea and vomiting after your treatment, we encourage you to take your nausea medication as directed.  BELOW ARE SYMPTOMS THAT SHOULD BE REPORTED IMMEDIATELY: *FEVER GREATER THAN 100.4 F (38 C) OR HIGHER *CHILLS OR SWEATING *NAUSEA AND VOMITING THAT IS NOT CONTROLLED WITH YOUR NAUSEA MEDICATION *UNUSUAL SHORTNESS OF BREATH *UNUSUAL BRUISING OR BLEEDING *URINARY PROBLEMS (pain or burning when urinating, or frequent urination) *BOWEL PROBLEMS (unusual diarrhea, constipation, pain near the anus) TENDERNESS IN MOUTH AND THROAT WITH OR WITHOUT PRESENCE OF ULCERS (sore throat, sores in mouth, or a toothache) UNUSUAL RASH, SWELLING OR PAIN  UNUSUAL VAGINAL DISCHARGE OR ITCHING   Items with * indicate a potential emergency and should be followed up as soon as possible or go to the Emergency Department if any problems should occur.  Please show the CHEMOTHERAPY ALERT CARD or IMMUNOTHERAPY ALERT CARD at  check-in to the Emergency Department and triage nurse.  Should you have questions after your visit or need to cancel or reschedule your appointment, please contact [facility]ICAL ONCOL[facility]  Dept: [phone]  and follow the prompts.  Office hours are 8:00 a.m. to 4:30 p.m. Monday - Friday. Please note that voicemails left after 4:00 p.m. may not be returned until the following business day.  We are closed weekends and major holidays. You have access to a nurse at all times for urgent questions. Please call the main number to the clinic Dept: [phone] and follow the prompts.   For any non-urgent questions, you may also contact your provider using MyChart. We now offer e-Visits for anyone 18 and older to request care online for non-urgent symptoms. For details visit [email or web address].   Also download the MyChart app! Go to the app store, search "MyChart", open the app, select [facility], and log in with your MyChart username and password.  Due to Covid, a mask is required upon entering the hospital/clinic. If you do not have a mask, one will be given to you upon arrival. For doctor visits, patients may have 1 support person aged 18 or older with them. For treatment visits, patients cannot have anyone with them due to current Covid guidelines and our immunocompromised population.   

## 2021-07-04 LAB — KAPPA/LAMBDA LIGHT CHAINS
Kappa free light chain: 6.8 mg/L (ref 3.3–19.4)
Kappa, lambda light chain ratio: 0.07 — ABNORMAL LOW (ref 0.26–1.65)
Lambda free light chains: 95.2 mg/L — ABNORMAL HIGH (ref 5.7–26.3)

## 2021-07-07 ENCOUNTER — Telehealth: Payer: Self-pay

## 2021-07-07 NOTE — Telephone Encounter (Signed)
I disagree with IVIG treatment, insurance will not likely cover, so she should get it at [facility]

## 2021-07-07 NOTE — Telephone Encounter (Signed)
She received a call from Dr. Kendell Bane office. Her IGA is 278. They are recommending IVIG x 4 days. She can get it a [facility] or here at [facility]. Do you want her to schedule the IVIG at [facility] or do you want to do it at [facility]?  She is asking if you can send a scheduling message for her appts in February/ March?

## 2021-07-07 NOTE — Telephone Encounter (Signed)
Called her back. Clarification from her, IgG is low. She will contact [facility] to see if she can get IVIG next week. She will notify the office when she gets the IVIG scheduled.

## 2021-07-07 NOTE — Telephone Encounter (Signed)
I do not understand the recommendation IgG does not elevate IgA level, her treatment will not be covered by insurance I suggest she gets it at [facility]

## 2021-07-08 ENCOUNTER — Inpatient Hospital Stay: Payer: Medicare HMO

## 2021-07-08 ENCOUNTER — Other Ambulatory Visit: Payer: Self-pay

## 2021-07-08 VITALS — BP 135/48 | HR 68 | Temp 98.2°F | Resp 18 | Wt 161.0 lb

## 2021-07-08 DIAGNOSIS — C9002 Multiple myeloma in relapse: Secondary | ICD-10-CM

## 2021-07-08 DIAGNOSIS — Z5112 Encounter for antineoplastic immunotherapy: Secondary | ICD-10-CM | POA: Diagnosis not present

## 2021-07-08 LAB — MULTIPLE MYELOMA PANEL, SERUM
Albumin SerPl Elph-Mcnc: 3.4 g/dL (ref 2.9–4.4)
Albumin/Glob SerPl: 1.5 (ref 0.7–1.7)
Alpha 1: 0.2 g/dL (ref 0.0–0.4)
Alpha2 Glob SerPl Elph-Mcnc: 0.9 g/dL (ref 0.4–1.0)
B-Globulin SerPl Elph-Mcnc: 0.7 g/dL (ref 0.7–1.3)
Gamma Glob SerPl Elph-Mcnc: 0.5 g/dL (ref 0.4–1.8)
Globulin, Total: 2.3 g/dL (ref 2.2–3.9)
IgA: 275 mg/dL (ref 64–422)
IgG (Immunoglobin G), Serum: 328 mg/dL — ABNORMAL LOW (ref 586–1602)
IgM (Immunoglobulin M), Srm: 24 mg/dL — ABNORMAL LOW (ref 26–217)
M Protein SerPl Elph-Mcnc: 0.2 g/dL — ABNORMAL HIGH
Total Protein ELP: 5.7 g/dL — ABNORMAL LOW (ref 6.0–8.5)

## 2021-07-08 MED ORDER — SODIUM CHLORIDE 0.9 % IV SOLN
20.0000 mg | Freq: Once | INTRAVENOUS | Status: AC
  Administered 2021-07-08: 20 mg via INTRAVENOUS
  Filled 2021-07-08: qty 20

## 2021-07-08 MED ORDER — DEXTROSE 5 % IV SOLN
27.0000 mg/m2 | Freq: Once | INTRAVENOUS | Status: AC
  Administered 2021-07-08: 50 mg via INTRAVENOUS
  Filled 2021-07-08: qty 15

## 2021-07-08 MED ORDER — SODIUM CHLORIDE 0.9 % IV SOLN: Freq: Once | INTRAVENOUS | Status: DC

## 2021-07-08 MED ORDER — SODIUM CHLORIDE 0.9 % IV SOLN: Freq: Once | INTRAVENOUS | Status: AC

## 2021-07-08 NOTE — Patient Instructions (Signed)
[facility]COLOG[facility]  Discharge Instructions: Thank you for choosing [facility] to provide your oncology and hematology care.   If you have a lab appointment with the Cancer Center, please go directly to the Cancer Center and check in at the registration area.   Wear comfortable clothing and clothing appropriate for easy access to any Portacath or PICC line.   We strive to give you quality time with your provider. You may need to reschedule your appointment if you arrive late (15 or more minutes).  Arriving late affects you and other patients whose appointments are after yours.  Also, if you miss three or more appointments without notifying the office, you may be dismissed from the clinic at the provider's discretion.      For prescription refill requests, have your pharmacy contact our office and allow 72 hours for refills to be completed.    Today you received the following chemotherapy and/or immunotherapy agent: Carfilzomib (Kyprolis).    To help prevent nausea and vomiting after your treatment, we encourage you to take your nausea medication as directed.  BELOW ARE SYMPTOMS THAT SHOULD BE REPORTED IMMEDIATELY: *FEVER GREATER THAN 100.4 F (38 C) OR HIGHER *CHILLS OR SWEATING *NAUSEA AND VOMITING THAT IS NOT CONTROLLED WITH YOUR NAUSEA MEDICATION *UNUSUAL SHORTNESS OF BREATH *UNUSUAL BRUISING OR BLEEDING *URINARY PROBLEMS (pain or burning when urinating, or frequent urination) *BOWEL PROBLEMS (unusual diarrhea, constipation, pain near the anus) TENDERNESS IN MOUTH AND THROAT WITH OR WITHOUT PRESENCE OF ULCERS (sore throat, sores in mouth, or a toothache) UNUSUAL RASH, SWELLING OR PAIN  UNUSUAL VAGINAL DISCHARGE OR ITCHING   Items with * indicate a potential emergency and should be followed up as soon as possible or go to the Emergency Department if any problems should occur.  Please show the CHEMOTHERAPY ALERT CARD or IMMUNOTHERAPY ALERT CARD at  check-in to the Emergency Department and triage nurse.  Should you have questions after your visit or need to cancel or reschedule your appointment, please contact [facility] ONCOL[facility]  Dept: [phone]  and follow the prompts.  Office hours are 8:00 a.m. to 4:30 p.m. Monday - Friday. Please note that voicemails left after 4:00 p.m. may not be returned until the following business day.  We are closed weekends and major holidays. You have access to a nurse at all times for urgent questions. Please call the main number to the clinic Dept: [phone] and follow the prompts.   For any non-urgent questions, you may also contact your provider using MyChart. We now offer e-Visits for anyone 18 and older to request care online for non-urgent symptoms. For details visit [email or web address].   Also download the MyChart app! Go to the app store, search "MyChart", open the app, select [facility], and log in with your MyChart username and password.  Due to Covid, a mask is required upon entering the hospital/clinic. If you do not have a mask, one will be given to you upon arrival. For doctor visits, patients may have 1 support person aged 18 or older with them. For treatment visits, patients cannot have anyone with them due to current Covid guidelines and our immunocompromised population.   

## 2021-07-12 ENCOUNTER — Telehealth: Payer: Self-pay

## 2021-07-12 NOTE — Telephone Encounter (Signed)
She called and left a message. Asking if Dr. Alvy Bimler could send a order for a bone density to [facility] in [place]. Fax # [phone].

## 2021-07-12 NOTE — Telephone Encounter (Signed)
Faxed order for bone density to [facility] in [place] at [phone]. Received fax confirmation.

## 2021-07-21 MED FILL — Dexamethasone Sodium Phosphate Inj 100 MG/10ML: INTRAMUSCULAR | Qty: 2 | Status: AC

## 2021-07-22 ENCOUNTER — Inpatient Hospital Stay: Payer: Medicare HMO | Admitting: Hematology and Oncology

## 2021-07-22 ENCOUNTER — Inpatient Hospital Stay: Payer: Medicare HMO

## 2021-07-22 ENCOUNTER — Other Ambulatory Visit: Payer: Self-pay

## 2021-07-22 VITALS — BP 142/77 | HR 73 | Temp 98.8°F | Resp 16

## 2021-07-22 DIAGNOSIS — Z5112 Encounter for antineoplastic immunotherapy: Secondary | ICD-10-CM | POA: Diagnosis not present

## 2021-07-22 DIAGNOSIS — D469 Myelodysplastic syndrome, unspecified: Secondary | ICD-10-CM

## 2021-07-22 DIAGNOSIS — C9002 Multiple myeloma in relapse: Secondary | ICD-10-CM

## 2021-07-22 LAB — CBC WITH DIFFERENTIAL (CANCER CENTER ONLY)
Abs Immature Granulocytes: 0.01 10*3/uL (ref 0.00–0.07)
Basophils Absolute: 0 10*3/uL (ref 0.0–0.1)
Basophils Relative: 0 %
Eosinophils Absolute: 0 10*3/uL (ref 0.0–0.5)
Eosinophils Relative: 0 %
HCT: 31 % — ABNORMAL LOW (ref 36.0–46.0)
Hemoglobin: 10.3 g/dL — ABNORMAL LOW (ref 12.0–15.0)
Immature Granulocytes: 0 %
Lymphocytes Relative: 34 %
Lymphs Abs: 2.2 10*3/uL (ref 0.7–4.0)
MCH: 34.2 pg — ABNORMAL HIGH (ref 26.0–34.0)
MCHC: 33.2 g/dL (ref 30.0–36.0)
MCV: 103 fL — ABNORMAL HIGH (ref 80.0–100.0)
Monocytes Absolute: 0.6 10*3/uL (ref 0.1–1.0)
Monocytes Relative: 10 %
Neutro Abs: 3.5 10*3/uL (ref 1.7–7.7)
Neutrophils Relative %: 56 %
Platelet Count: 241 10*3/uL (ref 150–400)
RBC: 3.01 MIL/uL — ABNORMAL LOW (ref 3.87–5.11)
RDW: 13.7 % (ref 11.5–15.5)
WBC Count: 6.3 10*3/uL (ref 4.0–10.5)
nRBC: 0 % (ref 0.0–0.2)

## 2021-07-22 LAB — CMP (CANCER CENTER ONLY)
ALT: 16 U/L (ref 0–44)
AST: 19 U/L (ref 15–41)
Albumin: 3.6 g/dL (ref 3.5–5.0)
Alkaline Phosphatase: 58 U/L (ref 38–126)
Anion gap: 5 (ref 5–15)
BUN: 23 mg/dL (ref 8–23)
CO2: 28 mmol/L (ref 22–32)
Calcium: 8.8 mg/dL — ABNORMAL LOW (ref 8.9–10.3)
Chloride: 105 mmol/L (ref 98–111)
Creatinine: 0.77 mg/dL (ref 0.44–1.00)
GFR, Estimated: >60 mL/min (ref >60)
Glucose, Bld: 96 mg/dL (ref 70–99)
Potassium: 4.1 mmol/L (ref 3.5–5.1)
Sodium: 138 mmol/L (ref 135–145)
Total Bilirubin: 0.4 mg/dL (ref 0.3–1.2)
Total Protein: 6.6 g/dL (ref 6.5–8.1)

## 2021-07-22 MED ORDER — SODIUM CHLORIDE 0.9% FLUSH
10.0000 mL | INTRAVENOUS | Status: DC | PRN
  Administered 2021-07-22: 10 mL

## 2021-07-22 MED ORDER — SODIUM CHLORIDE 0.9 % IV SOLN: Freq: Once | INTRAVENOUS | Status: AC

## 2021-07-22 MED ORDER — SODIUM CHLORIDE 0.9 % IV SOLN
20.0000 mg | Freq: Once | INTRAVENOUS | Status: AC
  Administered 2021-07-22: 20 mg via INTRAVENOUS
  Filled 2021-07-22: qty 20

## 2021-07-22 MED ORDER — HEPARIN SOD (PORK) LOCK FLUSH 100 UNIT/ML IV SOLN
500.0000 [IU] | Freq: Once | INTRAVENOUS | Status: AC | PRN
  Administered 2021-07-22: 500 [IU]

## 2021-07-22 MED ORDER — DEXTROSE 5 % IV SOLN
27.0000 mg/m2 | Freq: Once | INTRAVENOUS | Status: AC
  Administered 2021-07-22: 50 mg via INTRAVENOUS
  Filled 2021-07-22: qty 15

## 2021-07-22 MED ORDER — SODIUM CHLORIDE 0.9% FLUSH
10.0000 mL | Freq: Once | INTRAVENOUS | Status: AC
  Administered 2021-07-22: 10 mL

## 2021-07-25 LAB — KAPPA/LAMBDA LIGHT CHAINS
Kappa free light chain: 8.4 mg/L (ref 3.3–19.4)
Kappa, lambda light chain ratio: 0.08 — ABNORMAL LOW (ref 0.26–1.65)
Lambda free light chains: 104.6 mg/L — ABNORMAL HIGH (ref 5.7–26.3)

## 2021-07-26 LAB — MULTIPLE MYELOMA PANEL, SERUM
Albumin SerPl Elph-Mcnc: 3.1 g/dL (ref 2.9–4.4)
Albumin/Glob SerPl: 1.1 (ref 0.7–1.7)
Alpha 1: 0.2 g/dL (ref 0.0–0.4)
Alpha2 Glob SerPl Elph-Mcnc: 0.7 g/dL (ref 0.4–1.0)
B-Globulin SerPl Elph-Mcnc: 0.8 g/dL (ref 0.7–1.3)
Gamma Glob SerPl Elph-Mcnc: 1.2 g/dL (ref 0.4–1.8)
Globulin, Total: 3 g/dL (ref 2.2–3.9)
IgA: 274 mg/dL (ref 64–422)
IgG (Immunoglobin G), Serum: 876 mg/dL (ref 586–1602)
IgM (Immunoglobulin M), Srm: 26 mg/dL (ref 26–217)
M Protein SerPl Elph-Mcnc: 0.3 g/dL — ABNORMAL HIGH
Total Protein ELP: 6.1 g/dL (ref 6.0–8.5)

## 2021-07-27 MED FILL — Dexamethasone Sodium Phosphate Inj 100 MG/10ML: INTRAMUSCULAR | Qty: 2 | Status: AC

## 2021-07-28 ENCOUNTER — Inpatient Hospital Stay: Payer: Medicare HMO | Attending: Hematology and Oncology

## 2021-07-28 ENCOUNTER — Other Ambulatory Visit: Payer: Self-pay

## 2021-07-28 ENCOUNTER — Inpatient Hospital Stay: Payer: Medicare HMO

## 2021-07-28 ENCOUNTER — Other Ambulatory Visit: Payer: Self-pay | Admitting: Hematology and Oncology

## 2021-07-28 VITALS — BP 144/69 | HR 63 | Temp 98.9°F | Resp 18 | Wt 162.1 lb

## 2021-07-28 DIAGNOSIS — D6481 Anemia due to antineoplastic chemotherapy: Secondary | ICD-10-CM | POA: Insufficient documentation

## 2021-07-28 DIAGNOSIS — C9002 Multiple myeloma in relapse: Secondary | ICD-10-CM

## 2021-07-28 DIAGNOSIS — Z5111 Encounter for antineoplastic chemotherapy: Secondary | ICD-10-CM | POA: Diagnosis present

## 2021-07-28 DIAGNOSIS — D469 Myelodysplastic syndrome, unspecified: Secondary | ICD-10-CM

## 2021-07-28 LAB — CBC WITH DIFFERENTIAL (CANCER CENTER ONLY)
Abs Immature Granulocytes: 0.02 10*3/uL (ref 0.00–0.07)
Basophils Absolute: 0 10*3/uL (ref 0.0–0.1)
Basophils Relative: 0 %
Eosinophils Absolute: 0 10*3/uL (ref 0.0–0.5)
Eosinophils Relative: 0 %
HCT: 32 % — ABNORMAL LOW (ref 36.0–46.0)
Hemoglobin: 10.6 g/dL — ABNORMAL LOW (ref 12.0–15.0)
Immature Granulocytes: 0 %
Lymphocytes Relative: 32 %
Lymphs Abs: 2.1 10*3/uL (ref 0.7–4.0)
MCH: 34.8 pg — ABNORMAL HIGH (ref 26.0–34.0)
MCHC: 33.1 g/dL (ref 30.0–36.0)
MCV: 104.9 fL — ABNORMAL HIGH (ref 80.0–100.0)
Monocytes Absolute: 0.9 10*3/uL (ref 0.1–1.0)
Monocytes Relative: 13 %
Neutro Abs: 3.7 10*3/uL (ref 1.7–7.7)
Neutrophils Relative %: 55 %
Platelet Count: 214 10*3/uL (ref 150–400)
RBC: 3.05 MIL/uL — ABNORMAL LOW (ref 3.87–5.11)
RDW: 14.6 % (ref 11.5–15.5)
WBC Count: 6.7 10*3/uL (ref 4.0–10.5)
nRBC: 0 % (ref 0.0–0.2)

## 2021-07-28 LAB — CMP (CANCER CENTER ONLY)
ALT: 10 U/L (ref 0–44)
AST: 14 U/L — ABNORMAL LOW (ref 15–41)
Albumin: 3.7 g/dL (ref 3.5–5.0)
Alkaline Phosphatase: 63 U/L (ref 38–126)
Anion gap: 4 — ABNORMAL LOW (ref 5–15)
BUN: 18 mg/dL (ref 8–23)
CO2: 28 mmol/L (ref 22–32)
Calcium: 8.7 mg/dL — ABNORMAL LOW (ref 8.9–10.3)
Chloride: 107 mmol/L (ref 98–111)
Creatinine: 0.74 mg/dL (ref 0.44–1.00)
GFR, Estimated: >60 mL/min (ref >60)
Glucose, Bld: 90 mg/dL (ref 70–99)
Potassium: 4.4 mmol/L (ref 3.5–5.1)
Sodium: 139 mmol/L (ref 135–145)
Total Bilirubin: 0.3 mg/dL (ref 0.3–1.2)
Total Protein: 6.7 g/dL (ref 6.5–8.1)

## 2021-07-28 MED ORDER — ZOLEDRONIC ACID 4 MG/100ML IV SOLN: 4.0000 mg | Freq: Once | INTRAVENOUS | Status: DC

## 2021-07-28 MED ORDER — SODIUM CHLORIDE 0.9 % IV SOLN: Freq: Once | INTRAVENOUS | Status: AC

## 2021-07-28 MED ORDER — SODIUM CHLORIDE 0.9 % IV SOLN
20.0000 mg | Freq: Once | INTRAVENOUS | Status: AC
  Administered 2021-07-28: 20 mg via INTRAVENOUS
  Filled 2021-07-28: qty 20

## 2021-07-28 MED ORDER — SODIUM CHLORIDE 0.9% FLUSH
10.0000 mL | INTRAVENOUS | Status: DC | PRN
  Administered 2021-07-28: 10 mL

## 2021-07-28 MED ORDER — HEPARIN SOD (PORK) LOCK FLUSH 100 UNIT/ML IV SOLN
500.0000 [IU] | Freq: Once | INTRAVENOUS | Status: AC | PRN
  Administered 2021-07-28: 500 [IU]

## 2021-07-28 MED ORDER — DEXTROSE 5 % IV SOLN
27.0000 mg/m2 | Freq: Once | INTRAVENOUS | Status: AC
  Administered 2021-07-28: 50 mg via INTRAVENOUS
  Filled 2021-07-28: qty 15

## 2021-07-28 MED ORDER — SODIUM CHLORIDE 0.9% FLUSH
10.0000 mL | Freq: Once | INTRAVENOUS | Status: AC
  Administered 2021-07-28: 10 mL

## 2021-08-04 MED FILL — Dexamethasone Sodium Phosphate Inj 100 MG/10ML: INTRAMUSCULAR | Qty: 2 | Status: AC

## 2021-08-05 ENCOUNTER — Other Ambulatory Visit: Payer: Self-pay

## 2021-08-05 ENCOUNTER — Inpatient Hospital Stay: Payer: Medicare HMO

## 2021-08-05 VITALS — BP 151/70 | HR 65 | Temp 98.7°F | Resp 18

## 2021-08-05 DIAGNOSIS — C9002 Multiple myeloma in relapse: Secondary | ICD-10-CM

## 2021-08-05 DIAGNOSIS — Z5111 Encounter for antineoplastic chemotherapy: Secondary | ICD-10-CM | POA: Diagnosis not present

## 2021-08-05 DIAGNOSIS — D469 Myelodysplastic syndrome, unspecified: Secondary | ICD-10-CM

## 2021-08-05 LAB — CBC WITH DIFFERENTIAL (CANCER CENTER ONLY)
Abs Immature Granulocytes: 0.01 10*3/uL (ref 0.00–0.07)
Basophils Absolute: 0 10*3/uL (ref 0.0–0.1)
Basophils Relative: 0 %
Eosinophils Absolute: 0 10*3/uL (ref 0.0–0.5)
Eosinophils Relative: 0 %
HCT: 31.8 % — ABNORMAL LOW (ref 36.0–46.0)
Hemoglobin: 10.6 g/dL — ABNORMAL LOW (ref 12.0–15.0)
Immature Granulocytes: 0 %
Lymphocytes Relative: 24 %
Lymphs Abs: 1.6 10*3/uL (ref 0.7–4.0)
MCH: 35.3 pg — ABNORMAL HIGH (ref 26.0–34.0)
MCHC: 33.3 g/dL (ref 30.0–36.0)
MCV: 106 fL — ABNORMAL HIGH (ref 80.0–100.0)
Monocytes Absolute: 0.7 10*3/uL (ref 0.1–1.0)
Monocytes Relative: 11 %
Neutro Abs: 4.5 10*3/uL (ref 1.7–7.7)
Neutrophils Relative %: 65 %
Platelet Count: 182 10*3/uL (ref 150–400)
RBC: 3 MIL/uL — ABNORMAL LOW (ref 3.87–5.11)
RDW: 14.5 % (ref 11.5–15.5)
WBC Count: 6.9 10*3/uL (ref 4.0–10.5)
nRBC: 0 % (ref 0.0–0.2)

## 2021-08-05 LAB — CMP (CANCER CENTER ONLY)
ALT: 7 U/L (ref 0–44)
AST: 11 U/L — ABNORMAL LOW (ref 15–41)
Albumin: 3.7 g/dL (ref 3.5–5.0)
Alkaline Phosphatase: 66 U/L (ref 38–126)
Anion gap: 5 (ref 5–15)
BUN: 28 mg/dL — ABNORMAL HIGH (ref 8–23)
CO2: 28 mmol/L (ref 22–32)
Calcium: 9 mg/dL (ref 8.9–10.3)
Chloride: 106 mmol/L (ref 98–111)
Creatinine: 0.75 mg/dL (ref 0.44–1.00)
GFR, Estimated: >60 mL/min (ref >60)
Glucose, Bld: 95 mg/dL (ref 70–99)
Potassium: 4.1 mmol/L (ref 3.5–5.1)
Sodium: 139 mmol/L (ref 135–145)
Total Bilirubin: 0.3 mg/dL (ref 0.3–1.2)
Total Protein: 6.4 g/dL — ABNORMAL LOW (ref 6.5–8.1)

## 2021-08-05 MED ORDER — HEPARIN SOD (PORK) LOCK FLUSH 100 UNIT/ML IV SOLN
500.0000 [IU] | Freq: Once | INTRAVENOUS | Status: AC | PRN
  Administered 2021-08-05: 500 [IU]

## 2021-08-05 MED ORDER — SODIUM CHLORIDE 0.9% FLUSH
10.0000 mL | INTRAVENOUS | Status: DC | PRN
  Administered 2021-08-05: 10 mL

## 2021-08-05 MED ORDER — SODIUM CHLORIDE 0.9% FLUSH
10.0000 mL | Freq: Once | INTRAVENOUS | Status: AC
  Administered 2021-08-05: 10 mL

## 2021-08-05 MED ORDER — DEXTROSE 5 % IV SOLN
27.0000 mg/m2 | Freq: Once | INTRAVENOUS | Status: AC
  Administered 2021-08-05: 50 mg via INTRAVENOUS
  Filled 2021-08-05: qty 15

## 2021-08-05 MED ORDER — SODIUM CHLORIDE 0.9 % IV SOLN: Freq: Once | INTRAVENOUS | Status: AC

## 2021-08-05 MED ORDER — SODIUM CHLORIDE 0.9 % IV SOLN
20.0000 mg | Freq: Once | INTRAVENOUS | Status: AC
  Administered 2021-08-05: 20 mg via INTRAVENOUS
  Filled 2021-08-05: qty 20

## 2021-08-08 ENCOUNTER — Telehealth: Payer: Self-pay

## 2021-08-08 LAB — KAPPA/LAMBDA LIGHT CHAINS
Kappa free light chain: 7.3 mg/L (ref 3.3–19.4)
Kappa, lambda light chain ratio: 0.09 — ABNORMAL LOW (ref 0.26–1.65)
Lambda free light chains: 79.6 mg/L — ABNORMAL HIGH (ref 5.7–26.3)

## 2021-08-08 NOTE — Telephone Encounter (Signed)
She called and left a message requesting appt to dietician in there office and not in the infusion room. Sent a scheduling message.

## 2021-08-09 ENCOUNTER — Telehealth: Payer: Self-pay | Admitting: Hematology and Oncology

## 2021-08-09 LAB — MULTIPLE MYELOMA PANEL, SERUM
Albumin SerPl Elph-Mcnc: 3.4 g/dL (ref 2.9–4.4)
Albumin/Glob SerPl: 1.4 (ref 0.7–1.7)
Alpha 1: 0.2 g/dL (ref 0.0–0.4)
Alpha2 Glob SerPl Elph-Mcnc: 0.8 g/dL (ref 0.4–1.0)
B-Globulin SerPl Elph-Mcnc: 0.7 g/dL (ref 0.7–1.3)
Gamma Glob SerPl Elph-Mcnc: 0.8 g/dL (ref 0.4–1.8)
Globulin, Total: 2.5 g/dL (ref 2.2–3.9)
IgA: 235 mg/dL (ref 64–422)
IgG (Immunoglobin G), Serum: 657 mg/dL (ref 586–1602)
IgM (Immunoglobulin M), Srm: 26 mg/dL (ref 26–217)
M Protein SerPl Elph-Mcnc: 0.3 g/dL — ABNORMAL HIGH
Total Protein ELP: 5.9 g/dL — ABNORMAL LOW (ref 6.0–8.5)

## 2021-08-09 NOTE — Telephone Encounter (Signed)
Sch per 2/13 inbasket , pt req to see nutritionist in office, pt aware

## 2021-08-10 ENCOUNTER — Other Ambulatory Visit: Payer: Self-pay

## 2021-08-10 ENCOUNTER — Encounter: Payer: Self-pay | Admitting: General Practice

## 2021-08-10 ENCOUNTER — Inpatient Hospital Stay: Payer: Medicare HMO | Admitting: Dietician

## 2021-08-10 NOTE — Progress Notes (Signed)
Nutrition Assessment   Reason for Assessment: Patient request    ASSESSMENT: 76 year old female with multiple myeloma in relapse. S/p allogeneic bone marrow transplant at [facility]. She is currently receiving Kyrpolis + Dexamethasone q28d as well as Zometa q12w. Patient is under the care of Dr. Alvy Bimler.   Past medical history includes HTN, bilateral pleural effusion, peripheral neuropathy, osteopenia, prolapsed bladder, GERD, hiatal hernia, vit D deficiency, anemia, B12 deficiency   Met with patient in office. She reports altered taste. Most foods do not taste good or they are bland. Patient able to taste "foods that are not so good for me" recalling chocolate, ice cream, coke cola. Patient reports large hiatal hernia, she eats small portions. Patient often has acid reflux. She ate 2 pieces of pizza for dinner a few nights ago. This was too much. Patient reports intermittent diarrhea. Had an episode last night ~8PM requiring two changes of clothes. Patient unsure if this was delayed reaction to chili eaten at lunch time. Patient does not cook at home much, usually will prepare batch of soup. She has 2 cups of hot chocolate with mini marshmallows in the morning, occasionally will have a banana nut muffin. Patient eats leftovers for lunch and dinner. Patient enjoys [place] Onion soup from [place],  veal parm, lasagna, chicken soup from [employer]. She has Ensure at home, but has not been drinking these. Patient likes to drink coke. She does not care for water. Patient is not active currently, she has no energy.    Nutrition Focused Physical Exam: deferred    Medications: acyclovir, tums, cholecalciferol, imodium, MVI, remeron, zofran, protonix, compazine    Labs: 2/10 labs reviewed   Anthropometrics:   Height: 5' 2.5" Weight: 162 lb 1.9 oz (2/2) UBW: 157-160 lb (last 6 months per chart) BMI: 29.18   NUTRITION DIAGNOSIS: Food and nutrition related knowledge deficit related to cancer and  associated treatment side effects as evidenced by no prior need for related nutrition information   INTERVENTION:  Discussed strategies for altered taste - handout with tips provided Suggested pt remain upright ~45 minutes or walking after meals to promote digestion Discussed ways to increase protein intake, suggested trying supplement added to hot chocolate or to ice cream - shake recipes provided Discussed importance of hydration, encouraged increasing intake of water and ways to make more appealing Discussed strategies for diarrhea, foods to limit/avoid and foods to aid with adding bulk to stool - handout provided Contact information provided    MONITORING, EVALUATION, GOAL: Patient will tolerate adequate calories and protein to promote weight maintenance    Next Visit: No follow-up scheduled at this time. Patient encouraged to contact with nutrition questions/concerns

## 2021-08-10 NOTE — Progress Notes (Signed)
[facility] Spiritual Care Note  Ms Collett and I are acquainted through [facility]. Today had the unexpected pleasure of welcoming her to [facility] and orienting her to the patient and family support resources. She is eager for help from [facility] for taste disturbances, as eating is otherwise one of life's pleasures. She is aware of ongoing Spiritual Care availability, as well, should need/interest arise or circumstances change.   [place], [place], [facility] Pager [phone] Voicemail [phone]

## 2021-08-12 ENCOUNTER — Other Ambulatory Visit: Payer: Medicare HMO

## 2021-08-12 ENCOUNTER — Ambulatory Visit: Payer: Medicare HMO

## 2021-08-14 ENCOUNTER — Other Ambulatory Visit: Payer: Self-pay | Admitting: Hematology and Oncology

## 2021-08-14 ENCOUNTER — Other Ambulatory Visit: Payer: Self-pay | Admitting: Internal Medicine

## 2021-08-14 DIAGNOSIS — C9002 Multiple myeloma in relapse: Secondary | ICD-10-CM

## 2021-08-15 ENCOUNTER — Encounter: Payer: Self-pay | Admitting: Hematology and Oncology

## 2021-08-18 MED FILL — Dexamethasone Sodium Phosphate Inj 100 MG/10ML: INTRAMUSCULAR | Qty: 2 | Status: AC

## 2021-08-19 ENCOUNTER — Inpatient Hospital Stay: Payer: Medicare HMO

## 2021-08-19 ENCOUNTER — Inpatient Hospital Stay: Payer: Medicare HMO | Admitting: Hematology and Oncology

## 2021-08-19 ENCOUNTER — Other Ambulatory Visit: Payer: Self-pay

## 2021-08-19 ENCOUNTER — Encounter: Payer: Self-pay | Admitting: Hematology and Oncology

## 2021-08-19 DIAGNOSIS — C9002 Multiple myeloma in relapse: Secondary | ICD-10-CM

## 2021-08-19 DIAGNOSIS — T451X5A Adverse effect of antineoplastic and immunosuppressive drugs, initial encounter: Secondary | ICD-10-CM | POA: Diagnosis not present

## 2021-08-19 DIAGNOSIS — D801 Nonfamilial hypogammaglobulinemia: Secondary | ICD-10-CM

## 2021-08-19 DIAGNOSIS — D469 Myelodysplastic syndrome, unspecified: Secondary | ICD-10-CM

## 2021-08-19 DIAGNOSIS — Z5111 Encounter for antineoplastic chemotherapy: Secondary | ICD-10-CM | POA: Diagnosis not present

## 2021-08-19 DIAGNOSIS — D6481 Anemia due to antineoplastic chemotherapy: Secondary | ICD-10-CM

## 2021-08-19 LAB — CBC WITH DIFFERENTIAL (CANCER CENTER ONLY)
Abs Immature Granulocytes: 0.01 10*3/uL (ref 0.00–0.07)
Basophils Absolute: 0 10*3/uL (ref 0.0–0.1)
Basophils Relative: 0 %
Eosinophils Absolute: 0 10*3/uL (ref 0.0–0.5)
Eosinophils Relative: 0 %
HCT: 33.7 % — ABNORMAL LOW (ref 36.0–46.0)
Hemoglobin: 11 g/dL — ABNORMAL LOW (ref 12.0–15.0)
Immature Granulocytes: 0 %
Lymphocytes Relative: 30 %
Lymphs Abs: 1.7 10*3/uL (ref 0.7–4.0)
MCH: 34.8 pg — ABNORMAL HIGH (ref 26.0–34.0)
MCHC: 32.6 g/dL (ref 30.0–36.0)
MCV: 106.6 fL — ABNORMAL HIGH (ref 80.0–100.0)
Monocytes Absolute: 0.6 10*3/uL (ref 0.1–1.0)
Monocytes Relative: 10 %
Neutro Abs: 3.3 10*3/uL (ref 1.7–7.7)
Neutrophils Relative %: 60 %
Platelet Count: 184 10*3/uL (ref 150–400)
RBC: 3.16 MIL/uL — ABNORMAL LOW (ref 3.87–5.11)
RDW: 13.8 % (ref 11.5–15.5)
WBC Count: 5.6 10*3/uL (ref 4.0–10.5)
nRBC: 0 % (ref 0.0–0.2)

## 2021-08-19 LAB — CMP (CANCER CENTER ONLY)
ALT: 10 U/L (ref 0–44)
AST: 13 U/L — ABNORMAL LOW (ref 15–41)
Albumin: 3.7 g/dL (ref 3.5–5.0)
Alkaline Phosphatase: 56 U/L (ref 38–126)
Anion gap: 4 — ABNORMAL LOW (ref 5–15)
BUN: 22 mg/dL (ref 8–23)
CO2: 30 mmol/L (ref 22–32)
Calcium: 9 mg/dL (ref 8.9–10.3)
Chloride: 106 mmol/L (ref 98–111)
Creatinine: 0.71 mg/dL (ref 0.44–1.00)
GFR, Estimated: >60 mL/min (ref >60)
Glucose, Bld: 88 mg/dL (ref 70–99)
Potassium: 4.1 mmol/L (ref 3.5–5.1)
Sodium: 140 mmol/L (ref 135–145)
Total Bilirubin: 0.3 mg/dL (ref 0.3–1.2)
Total Protein: 6.1 g/dL — ABNORMAL LOW (ref 6.5–8.1)

## 2021-08-19 MED ORDER — HEPARIN SOD (PORK) LOCK FLUSH 100 UNIT/ML IV SOLN
500.0000 [IU] | Freq: Once | INTRAVENOUS | Status: AC | PRN
  Administered 2021-08-19: 500 [IU]

## 2021-08-19 MED ORDER — SODIUM CHLORIDE 0.9% FLUSH
10.0000 mL | Freq: Once | INTRAVENOUS | Status: AC
  Administered 2021-08-19: 10 mL

## 2021-08-19 MED ORDER — SODIUM CHLORIDE 0.9% FLUSH
10.0000 mL | INTRAVENOUS | Status: DC | PRN
  Administered 2021-08-19: 10 mL

## 2021-08-19 MED ORDER — DEXTROSE 5 % IV SOLN: 36.0000 mg/m2 | Freq: Once | INTRAVENOUS | Status: DC

## 2021-08-19 MED ORDER — DEXTROSE 5 % IV SOLN
35.0000 mg/m2 | Freq: Once | INTRAVENOUS | Status: AC
  Administered 2021-08-19: 60 mg via INTRAVENOUS
  Filled 2021-08-19: qty 30

## 2021-08-19 MED ORDER — SODIUM CHLORIDE 0.9 % IV SOLN: Freq: Once | INTRAVENOUS | Status: AC

## 2021-08-19 MED ORDER — SODIUM CHLORIDE 0.9 % IV SOLN
20.0000 mg | Freq: Once | INTRAVENOUS | Status: AC
  Administered 2021-08-19: 20 mg via INTRAVENOUS
  Filled 2021-08-19: qty 20

## 2021-08-19 NOTE — Patient Instructions (Signed)
[facility] MEDICAL [facility]COLOGY  Discharge Instructions: Thank you for choosing [facility] to provide your oncology and hematology care.   If you have a lab appointment with the Cancer Center, please go directly to the Cancer Center and check in at the registration area.   Wear comfortable clothing and clothing appropriate for easy access to any Portacath or PICC line.   We strive to give you quality time with your provider. You may need to reschedule your appointment if you arrive late (15 or more minutes).  Arriving late affects you and other patients whose appointments are after yours.  Also, if you miss three or more appointments without notifying the office, you may be dismissed from the clinic at the provider's discretion.      For prescription refill requests, have your pharmacy contact our office and allow 72 hours for refills to be completed.    Today you received the following chemotherapy and/or immunotherapy agents: Kyprolis    To help prevent nausea and vomiting after your treatment, we encourage you to take your nausea medication as directed.  BELOW ARE SYMPTOMS THAT SHOULD BE REPORTED IMMEDIATELY: . *FEVER GREATER THAN 100.4 F (38 C) OR HIGHER . *CHILLS OR SWEATING . *NAUSEA AND VOMITING THAT IS NOT CONTROLLED WITH YOUR NAUSEA MEDICATION . *UNUSUAL SHORTNESS OF BREATH . *UNUSUAL BRUISING OR BLEEDING . *URINARY PROBLEMS (pain or burning when urinating, or frequent urination) . *BOWEL PROBLEMS (unusual diarrhea, constipation, pain near the anus) . TENDERNESS IN MOUTH AND THROAT WITH OR WITHOUT PRESENCE OF ULCERS (sore throat, sores in mouth, or a toothache) . UNUSUAL RASH, SWELLING OR PAIN  . UNUSUAL VAGINAL DISCHARGE OR ITCHING   Items with * indicate a potential emergency and should be followed up as soon as possible or go to the Emergency Department if any problems should occur.  Please show the CHEMOTHERAPY ALERT CARD or IMMUNOTHERAPY ALERT  CARD at check-in to the Emergency Department and triage nurse.  Should you have questions after your visit or need to cancel or reschedule your appointment, please contact [facility]  Dept: [phone]  and follow the prompts.  Office hours are 8:00 a.m. to 4:30 p.m. Monday - Friday. Please note that voicemails left after 4:00 p.m. may not be returned until the following business day.  We are closed weekends and major holidays. You have access to a nurse at all times for urgent questions. Please call the main number to the clinic Dept: [phone] and follow the prompts.   For any non-urgent questions, you may also contact your provider using MyChart. We now offer e-Visits for anyone 18 and older to request care online for non-urgent symptoms. For details visit [email or web address].   Also download the MyChart app! Go to the app store, search "MyChart", open the app, select [facility], and log in with your MyChart username and password.  Due to Covid, a mask is required upon entering the hospital/clinic. If you do not have a mask, one will be given to you upon arrival. For doctor visits, patients may have 1 support person aged 18 or older with them. For treatment visits, patients cannot have anyone with them due to current Covid guidelines and our immunocompromised population.   

## 2021-08-19 NOTE — Assessment & Plan Note (Signed)
Overall, she has positive response to treatment I do not like her progressive weight gain due to the steroid treatment We discussed the risk and benefits of increasing the dose of her Kyprolis to its maximum of 56 mg per metered squared versus continuing treatment as of right now without changing or adding Cytoxan Overall, we are both in favor of increasing the dose of Kyprolis with the goal to eventually wean her off dexamethasone The risk and benefits of increasing the dose is fully discussed with the patient and she is in agreement to try I plan to increase the dose to 36 mg per metered square starting today and reassess next month If she tolerates that, we will eventually increase all the way up to 56 mg per metered square  I reviewed recent results of mammogram and bone density scan She will continue calcium with vitamin D and Zometa as scheduled

## 2021-08-19 NOTE — Assessment & Plan Note (Signed)
This is likely due to recent treatment. The patient denies recent history of bleeding such as epistaxis, hematuria or hematochezia. She is asymptomatic from the anemia. I will observe for now.  She does not require transfusion now. I will continue the chemotherapy at current dose without dosage adjustment.  If the anemia gets progressive worse in the future, I might have to delay her treatment or adjust the chemotherapy dose.  

## 2021-08-19 NOTE — Assessment & Plan Note (Signed)
She has no recent infection She will continue acyclovir for antimicrobial prophylaxis

## 2021-08-19 NOTE — Progress Notes (Signed)
[facility] OFFICE PROGRESS NOTE  Patient Care Team: [name], [name], MD as PCP - General (Internal Medicine) [name], MD as PCP - Cardiology (Cardiology) [name], MD as Referring Physician (Hematology and Oncology) [name], MD as Consulting Physician (Internal Medicine) [name], [name], MD as Consulting Physician (Infectious Diseases) [name], MD as Consulting Physician (Hematology and Oncology) [name], NP as Nurse Practitioner (Hematology and Oncology) [name], MD as Consulting Physician (Hematology and Oncology)  ASSESSMENT & PLAN:  Multiple myeloma in relapse ([place]) Overall, she has positive response to treatment I do not like her progressive weight gain due to the steroid treatment We discussed the risk and benefits of increasing the dose of her Kyprolis to its maximum of 56 mg per metered squared versus continuing treatment as of right now without changing or adding Cytoxan Overall, we are both in favor of increasing the dose of Kyprolis with the goal to eventually wean her off dexamethasone The risk and benefits of increasing the dose is fully discussed with the patient and she is in agreement to try I plan to increase the dose to 36 mg per metered square starting today and reassess next month If she tolerates that, we will eventually increase all the way up to 56 mg per metered square  I reviewed recent results of mammogram and bone density scan She will continue calcium with vitamin D and Zometa as scheduled  Acquired hypogammaglobulinemia She has no recent infection She will continue acyclovir for antimicrobial prophylaxis  Anemia due to antineoplastic chemotherapy This is likely due to recent treatment. The patient denies recent history of bleeding such as epistaxis, hematuria or hematochezia. She is asymptomatic from the anemia. I will observe for now.  She does not require transfusion  now. I will continue the chemotherapy at current dose without dosage adjustment.  If the anemia gets progressive worse in the future, I might have to delay her treatment or adjust the chemotherapy dose.   No orders of the defined types were placed in this encounter.   All questions were answered. The patient knows to call the clinic with any problems, questions or concerns. The total time spent in the appointment was 30 minutes encounter with patients including review of chart and various tests results, discussions about plan of care and coordination of care plan   [name], MD 08/19/2021 9:21 AM  INTERVAL HISTORY: Please see below for problem oriented charting. she returns for treatment follow-up She is doing well She started exercising again She has gained some weight No recent infection, fever or chills  REVIEW OF SYSTEMS:   Constitutional: Denies fevers, chills or abnormal weight loss Eyes: Denies blurriness of vision Ears, nose, mouth, throat, and face: Denies mucositis or sore throat Respiratory: Denies cough, dyspnea or wheezes Cardiovascular: Denies palpitation, chest discomfort or lower extremity swelling Gastrointestinal:  Denies nausea, heartburn or change in bowel habits Skin: Denies abnormal skin rashes Lymphatics: Denies new lymphadenopathy or easy bruising Neurological:Denies numbness, tingling or new weaknesses Behavioral/Psych: Mood is stable, no new changes  All other systems were reviewed with the patient and are negative.  I have reviewed the past medical history, past surgical history, social history and family history with the patient and they are unchanged from previous note.  ALLERGIES:  has No Known Allergies.  MEDICATIONS:  Current Outpatient Medications  Medication Sig Dispense Refill   acyclovir (ZOVIRAX) 400 MG tablet TAKE 1 TABLET BY MOUTH TWICE A  DAY 180 tablet 11   aspirin 325 MG tablet Take 325 mg by mouth daily.     azithromycin (ZITHROMAX)  500 MG tablet Take 1 tablet (500 mg total) by mouth daily. 3 tablet 0   calcium carbonate (TUMS - DOSED IN MG ELEMENTAL CALCIUM) 500 MG chewable tablet Chew 1 tablet by mouth daily.     Carfilzomib (KYPROLIS IV) Inject into the vein once a week.     Cholecalciferol 25 MCG (1000 UT) tablet Take 1,000 Units by mouth daily.     Cyanocobalamin (VITAMIN B12 PO) Take 1 tablet by mouth daily.     loperamide (IMODIUM) 2 MG capsule Take by mouth as needed for diarrhea or loose stools. As needed only     metoprolol tartrate (LOPRESSOR) 25 MG tablet TAKE 1.5 TABLETS (37.5 MG TOTAL) BY MOUTH 2 (TWO) TIMES DAILY. 270 tablet 1   mirtazapine (REMERON) 15 MG tablet TAKE 1 TABLET BY MOUTH EVERYDAY AT BEDTIME 90 tablet 1   Multiple Vitamin (MULTIVITAMIN WITH MINERALS) TABS tablet Take 1 tablet by mouth daily.     Multiple Vitamins-Minerals (PRESERVISION AREDS 2 PO) Take 1 tablet by mouth 2 (two) times daily.     ondansetron (ZOFRAN) 8 MG tablet Take 1 tablet (8 mg total) by mouth every 8 (eight) hours as needed (Nausea or vomiting). 30 tablet 1   pantoprazole (PROTONIX) 40 MG tablet TAKE 1 TABLET BY MOUTH 2 TIMES DAILY. TAKE 30-60 MINUTES BEFORE BREAKFAST AND DINNER. 180 tablet 2   prochlorperazine (COMPAZINE) 10 MG tablet Take 1 tablet (10 mg total) by mouth every 6 (six) hours as needed (Nausea or vomiting). 30 tablet 1   No current facility-administered medications for this visit.   Facility-Administered Medications Ordered in Other Visits  Medication Dose Route Frequency Provider Last Rate Last Admin   carfilzomib (KYPROLIS) 64 mg in dextrose 5 % 100 mL chemo infusion  36 mg/m2 (Treatment Plan Recorded) Intravenous Once Alvy Bimler, Pratyush Ammon, MD       dexamethasone (DECADRON) 20 mg in sodium chloride 0.9 % 50 mL IVPB  20 mg Intravenous Once [name], MD 208 mL/hr at 08/19/21 0919 20 mg at 08/19/21 0919   heparin lock flush 100 unit/mL  500 Units Intracatheter Once PRN Alvy Bimler, Mayline Dragon, MD       sodium chloride flush (NS)  0.9 % injection 10 mL  10 mL Intracatheter PRN Alvy Bimler, Arden Tinoco, MD        SUMMARY OF ONCOLOGIC HISTORY: Oncology History Overview Note  Multiple myeloma, Ig A Lambda, M spike 3.54 grams, Calcium 9.2, Creatinine 0.8, Beta 2 microglobulin 4.52, IgA 4840 mg/dL, lambda light chain 75.4, albumin 3.6, hemoglobin 9.7, platelet 115    Primary site: Multiple Myeloma   Staging method: AJCC 6th Edition   Clinical: Stage IIA signed by [name], MD on 11/07/2013  2:46 PM   Summary: Stage IIA S/p Allo transplant at [facility] on revlimid, pomalyst, daratumumab and velcade     Multiple myeloma in relapse ([place])  10/31/2013 Bone Marrow Biopsy   Bone marrow biopsy confirmed multiple myeloma with 40% bone marrow involvement. Skeletal survey showed minimal lesions in her score with generalized demineralization   11/10/2013 - 02/13/2014 Chemotherapy   The patient is started on induction chemotherapy with weekly dexamethasone 40 mg by mouth as well as Velcade subcutaneous injection on days 1, 4, 8 and 11. On 11/21/2013, she was started on monthly Zometa.   12/23/2013 Adverse Reaction   The dose of Velcade was reduced due to thrombocytopenia.  01/28/2014 - 04/07/2014 Chemotherapy   Revlimid is added. Treatment was discontinued due to lack of response.   02/24/2014 - 04/07/2014 Chemotherapy   Due to worsening peripheral neuropathy, Velcade injection is changed to once a week. Revlimid was given 21 days on, 7 days off.   04/07/2014 - 04/10/2014 Chemotherapy   Revlimid was discontinued due to lack of response. Chemotherapy was changed back to Velcade injection twice a week, 2 weeks on 1 week off. Her treatment was switched to to minimum response   04/20/2014 - 06/02/2014 Chemotherapy   chemotherapy is switched to Carfilzomib, Cytoxan and dexamethasone.   04/22/2014 Procedure   she has placement of port for chemotherapy.   06/01/2014 Tumor Marker   Bloodwork show that she has greater than partial response    06/23/2014 Bone Marrow Biopsy   Bone marrow biopsy show 5-10% residual plasma cells, normal cytogenetics and FISH   07/07/2014 Procedure   She had stem cell collection   07/22/2014 - 07/22/2014 Chemotherapy   She had high-dose chemotherapy with melphalan   07/23/2014 Bone Marrow Transplant   She had bone marrow transplant in autologous fashion at [facility]   10/20/2014 - 03/24/2015 Chemotherapy    she received chemotherapy with Kyprolis, Revlimid and dexamethasone   10/22/2014 Procedure   She has port placement   01/19/2015 Tumor Marker   IgA lambda M spike at 0.4 g    01/20/2015 Miscellaneous   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 which showed mild pancytopenia but not diagnostic for myelodysplastic syndrome or multiple myeloma   07/22/2015 - 09/21/2015 Chemotherapy   She is receiving Daratumumab at [place] due to relapsed myeloma   08/03/2015 - 08/06/2015 Hospital Admission   She was admitted to the hospital for neutropenic fever. No cource was found and fever resolved with IV vancomycin and meropenem   09/13/2015 Bone Marrow Biopsy   Bone marrow biopsy showed no increased blasts, 3-4 % plasma cells   03/02/2016 Bone Marrow Biopsy   Bone marrow biopsy at [facility] showed normocellular (30%) bone marrow with trilineage hematopoiesis. No significant increase in blasts. No significant increase in plasma cells.   05/12/2016 Imaging   DEXA scan at [place] showed osteopenia   10/24/2016 Imaging   Skeletal survey at [facility], no new lesions   12/07/2017 Imaging   No focal abnormality noted to suggest myeloma. Exam is stable from prior exam.   03/01/2018 Procedure   Successful 8 French right internal jugular vein power port placement with its tip at the SVC/RA junction.   03/06/2018 -  Chemotherapy   The patient had daratumumab    07/09/2018 Bone Marrow  Biopsy   Bone marrow biopsy at [facility] showed residual disease at 0.004% plasma cells   07/03/2019 Bone Marrow Biopsy   A. Bone marrow, flow cytometric analysis for multiple myeloma minimal residual disease detection:   Negative. No phenotypically abnormal plasma cells at or above the limit of detection identified.     No monotypic B-cell population identified. Negative for increased blasts.   03/25/2021 Bone Marrow Biopsy   Repeat bone marrow biopsy at [place] showed female donor karyotype.  5 to 7% plasma cell is seen.  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  These results are consistent with persistent of this patient's previously  identified abnormal clone.   04/20/2021 Echocardiogram   1. Left ventricular ejection fraction, by estimation, is 60 to 65%. The left ventricle has normal function. The left ventricle has no regional wall motion abnormalities. Left ventricular diastolic parameters were normal.  2. Right ventricular systolic function is normal. The right ventricular size is normal.  3. The mitral valve is normal in structure. No evidence of mitral valve regurgitation. No evidence of mitral stenosis.  4. The aortic valve is normal in structure. Aortic valve regurgitation is not visualized. No aortic stenosis is present.  5. The inferior vena cava is normal in size with greater than 50% respiratory variability, suggesting right atrial pressure of 3 mmHg.   04/29/2021 -  Chemotherapy   Patient is on Treatment Plan : MYELOMA RELAPSED/ REFRACTORY Carfilzomib D1,8,15 (20/27) + Dexamethasone (KPd) q28d     05/04/2021 PET scan   1. No evidence active multiple myeloma within the skeleton on FDG PET scan. 2. No suspicious lytic lesion on CT portion exam. 3. No plasmacytoma.   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[place])  04/06/2015 Bone Marrow Biopsy   Accession: [id number] BM biopsy showed RAEB-1   04/06/2015 Tumor  Marker   Cytogenetics and FISH for MDS are within normal limits   10/06/2015 - 10/10/2015 Chemotherapy   She received conditioning chemotherapy with busulfan and melphalan   10/12/2015 Bone Marrow Transplant   She received allogenic stem cell transplant   10/19/2015 Adverse Reaction   She developed posttransplant complication with mucositis, viral infection with rhinovirus, neutropenic fever, bilateral pleural effusion and moderate pericardial effusion and CMV reactivation.   10/31/2015 Miscellaneous   She has engrafted     PHYSICAL EXAMINATION: ECOG PERFORMANCE STATUS: 1 - Symptomatic but completely ambulatory  Vitals:   08/19/21 0837  BP: 131/65  Pulse: 72  Resp: 18  Temp: 98.1 F (36.7 C)  SpO2: 99%   Filed Weights   08/19/21 0837  Weight: 164 lb 6.4 oz (74.6 kg)    GENERAL:alert, no distress and comfortable NEURO: alert & oriented x 3 with fluent speech, no focal motor/sensory deficits  LABORATORY DATA:  I have reviewed the data as listed    Component Value Date/Time   NA 140 08/19/2021 0818   NA 142 01/04/2017 0902   K 4.1 08/19/2021 0818   K 3.9 01/04/2017 0902   CL 106 08/19/2021 0818   CO2 30 08/19/2021 0818   CO2 28 01/04/2017 0902   GLUCOSE 88 08/19/2021 0818   GLUCOSE 92 01/04/2017 0902   BUN 22 08/19/2021 0818   BUN 9.5 01/04/2017 0902   CREATININE 0.71 08/19/2021 0818   CREATININE 0.7 01/04/2017 0902   CALCIUM 9.0 08/19/2021 0818   CALCIUM 9.0 01/04/2017 0902   PROT 6.1 (L) 08/19/2021 0818   PROT 6.0 (L) 01/04/2017 0902   PROT 5.8 (L) 01/04/2017 0902   ALBUMIN 3.7 08/19/2021 0818   ALBUMIN 3.4 (L) 01/04/2017 0902   AST 13 (L) 08/19/2021 0818   AST 21 01/04/2017 0902   ALT 10 08/19/2021 0818   ALT 16 01/04/2017 0902   ALKPHOS 56 08/19/2021 0818   ALKPHOS 101 01/04/2017 0902   BILITOT 0.3 08/19/2021 0818   BILITOT 0.56 01/04/2017 0902   GFRNONAA >60 08/19/2021 0818   GFRAA >60 03/25/2020 0917   GFRAA >60 12/18/2018 0901    No results  found for: SPEP, UPEP  Lab Results  Component Value Date   WBC 5.6 08/19/2021   NEUTROABS 3.3 08/19/2021   HGB 11.0 (L) 08/19/2021   HCT  33.7 (L) 08/19/2021   MCV 106.6 (H) 08/19/2021   PLT 184 08/19/2021      Chemistry      Component Value Date/Time   NA 140 08/19/2021 0818   NA 142 01/04/2017 0902   K 4.1 08/19/2021 0818   K 3.9 01/04/2017 0902   CL 106 08/19/2021 0818   CO2 30 08/19/2021 0818   CO2 28 01/04/2017 0902   BUN 22 08/19/2021 0818   BUN 9.5 01/04/2017 0902   CREATININE 0.71 08/19/2021 0818   CREATININE 0.7 01/04/2017 0902      Component Value Date/Time   CALCIUM 9.0 08/19/2021 0818   CALCIUM 9.0 01/04/2017 0902   ALKPHOS 56 08/19/2021 0818   ALKPHOS 101 01/04/2017 0902   AST 13 (L) 08/19/2021 0818   AST 21 01/04/2017 0902   ALT 10 08/19/2021 0818   ALT 16 01/04/2017 0902   BILITOT 0.3 08/19/2021 0818   BILITOT 0.56 01/04/2017 0902

## 2021-08-22 ENCOUNTER — Telehealth: Payer: Self-pay | Admitting: Hematology and Oncology

## 2021-08-22 NOTE — Telephone Encounter (Signed)
Scheduled appointment per 2/24 scheduling message. Called patient to let her know that I was able to add her infusion appt for this Friday 3/3 at 0830 (port flush/lab at [place]). Patient is aware of upcoming appointment.

## 2021-08-25 MED FILL — Dexamethasone Sodium Phosphate Inj 100 MG/10ML: INTRAMUSCULAR | Qty: 2 | Status: AC

## 2021-08-26 ENCOUNTER — Inpatient Hospital Stay: Payer: Medicare HMO

## 2021-08-26 ENCOUNTER — Inpatient Hospital Stay: Payer: Medicare HMO | Attending: Hematology and Oncology

## 2021-08-26 ENCOUNTER — Other Ambulatory Visit: Payer: Self-pay

## 2021-08-26 VITALS — BP 139/60 | HR 58 | Temp 99.2°F | Resp 20 | Ht 62.5 in | Wt 164.5 lb

## 2021-08-26 DIAGNOSIS — D6481 Anemia due to antineoplastic chemotherapy: Secondary | ICD-10-CM | POA: Insufficient documentation

## 2021-08-26 DIAGNOSIS — Z5111 Encounter for antineoplastic chemotherapy: Secondary | ICD-10-CM | POA: Insufficient documentation

## 2021-08-26 DIAGNOSIS — D61818 Other pancytopenia: Secondary | ICD-10-CM | POA: Diagnosis not present

## 2021-08-26 DIAGNOSIS — C9002 Multiple myeloma in relapse: Secondary | ICD-10-CM | POA: Insufficient documentation

## 2021-08-26 DIAGNOSIS — D469 Myelodysplastic syndrome, unspecified: Secondary | ICD-10-CM

## 2021-08-26 DIAGNOSIS — D801 Nonfamilial hypogammaglobulinemia: Secondary | ICD-10-CM | POA: Diagnosis not present

## 2021-08-26 LAB — CBC WITH DIFFERENTIAL (CANCER CENTER ONLY)
Abs Immature Granulocytes: 0.01 10*3/uL (ref 0.00–0.07)
Basophils Absolute: 0 10*3/uL (ref 0.0–0.1)
Basophils Relative: 0 %
Eosinophils Absolute: 0 10*3/uL (ref 0.0–0.5)
Eosinophils Relative: 0 %
HCT: 33.8 % — ABNORMAL LOW (ref 36.0–46.0)
Hemoglobin: 11.1 g/dL — ABNORMAL LOW (ref 12.0–15.0)
Immature Granulocytes: 0 %
Lymphocytes Relative: 27 %
Lymphs Abs: 1.6 10*3/uL (ref 0.7–4.0)
MCH: 34.7 pg — ABNORMAL HIGH (ref 26.0–34.0)
MCHC: 32.8 g/dL (ref 30.0–36.0)
MCV: 105.6 fL — ABNORMAL HIGH (ref 80.0–100.0)
Monocytes Absolute: 0.7 10*3/uL (ref 0.1–1.0)
Monocytes Relative: 12 %
Neutro Abs: 3.6 10*3/uL (ref 1.7–7.7)
Neutrophils Relative %: 61 %
Platelet Count: 158 10*3/uL (ref 150–400)
RBC: 3.2 MIL/uL — ABNORMAL LOW (ref 3.87–5.11)
RDW: 13.2 % (ref 11.5–15.5)
WBC Count: 5.9 10*3/uL (ref 4.0–10.5)
nRBC: 0 % (ref 0.0–0.2)

## 2021-08-26 LAB — CMP (CANCER CENTER ONLY)
ALT: 8 U/L (ref 0–44)
AST: 10 U/L — ABNORMAL LOW (ref 15–41)
Albumin: 3.7 g/dL (ref 3.5–5.0)
Alkaline Phosphatase: 59 U/L (ref 38–126)
Anion gap: 4 — ABNORMAL LOW (ref 5–15)
BUN: 17 mg/dL (ref 8–23)
CO2: 29 mmol/L (ref 22–32)
Calcium: 9.1 mg/dL (ref 8.9–10.3)
Chloride: 108 mmol/L (ref 98–111)
Creatinine: 0.75 mg/dL (ref 0.44–1.00)
GFR, Estimated: >60 mL/min (ref >60)
Glucose, Bld: 88 mg/dL (ref 70–99)
Potassium: 4.2 mmol/L (ref 3.5–5.1)
Sodium: 141 mmol/L (ref 135–145)
Total Bilirubin: 0.3 mg/dL (ref 0.3–1.2)
Total Protein: 6.2 g/dL — ABNORMAL LOW (ref 6.5–8.1)

## 2021-08-26 MED ORDER — DEXTROSE 5 % IV SOLN
35.0000 mg/m2 | Freq: Once | INTRAVENOUS | Status: AC
  Administered 2021-08-26: 60 mg via INTRAVENOUS
  Filled 2021-08-26: qty 30

## 2021-08-26 MED ORDER — ZOLEDRONIC ACID 4 MG/100ML IV SOLN
4.0000 mg | Freq: Once | INTRAVENOUS | Status: AC
  Administered 2021-08-26: 4 mg via INTRAVENOUS
  Filled 2021-08-26: qty 100

## 2021-08-26 MED ORDER — HEPARIN SOD (PORK) LOCK FLUSH 100 UNIT/ML IV SOLN
500.0000 [IU] | Freq: Once | INTRAVENOUS | Status: AC | PRN
  Administered 2021-08-26: 500 [IU]

## 2021-08-26 MED ORDER — SODIUM CHLORIDE 0.9% FLUSH
10.0000 mL | Freq: Once | INTRAVENOUS | Status: AC
  Administered 2021-08-26: 10 mL

## 2021-08-26 MED ORDER — SODIUM CHLORIDE 0.9 % IV SOLN
20.0000 mg | Freq: Once | INTRAVENOUS | Status: AC
  Administered 2021-08-26: 20 mg via INTRAVENOUS
  Filled 2021-08-26: qty 20

## 2021-08-26 MED ORDER — SODIUM CHLORIDE 0.9 % IV SOLN: Freq: Once | INTRAVENOUS | Status: AC

## 2021-08-26 MED ORDER — SODIUM CHLORIDE 0.9% FLUSH
10.0000 mL | INTRAVENOUS | Status: DC | PRN
  Administered 2021-08-26: 10 mL

## 2021-08-26 NOTE — Patient Instructions (Signed)
[facility]  Discharge Instructions: Thank you for choosing [facility] to provide your oncology and hematology care.   If you have a lab appointment with the [facility], please go directly to the [facility] and check in at the registration area.   Wear comfortable clothing and clothing appropriate for easy access to any Portacath or PICC line.   We strive to give you quality time with your provider. You may need to reschedule your appointment if you arrive late (15 or more minutes).  Arriving late affects you and other patients whose appointments are after yours.  Also, if you miss three or more appointments without notifying the office, you may be dismissed from the clinic at the providers discretion.      For prescription refill requests, have your pharmacy contact our office and allow 72 hours for refills to be completed.    Today you received the following chemotherapy and/or immunotherapy agent: Carfilzomib (Kyprolis)   To help prevent nausea and vomiting after your treatment, we encourage you to take your nausea medication as directed.  BELOW ARE SYMPTOMS THAT SHOULD BE REPORTED IMMEDIATELY: *FEVER GREATER THAN 100.4 F (38 C) OR HIGHER *CHILLS OR SWEATING *NAUSEA AND VOMITING THAT IS NOT CONTROLLED WITH YOUR NAUSEA MEDICATION *UNUSUAL SHORTNESS OF BREATH *UNUSUAL BRUISING OR BLEEDING *URINARY PROBLEMS (pain or burning when urinating, or frequent urination) *BOWEL PROBLEMS (unusual diarrhea, constipation, pain near the anus) TENDERNESS IN MOUTH AND THROAT WITH OR WITHOUT PRESENCE OF ULCERS (sore throat, sores in mouth, or a toothache) UNUSUAL RASH, SWELLING OR PAIN  UNUSUAL VAGINAL DISCHARGE OR ITCHING   Items with * indicate a potential emergency and should be followed up as soon as possible or go to the Emergency Department if any problems should occur.  Please show the CHEMOTHERAPY ALERT CARD or IMMUNOTHERAPY ALERT CARD at  check-in to the Emergency Department and triage nurse.  Should you have questions after your visit or need to cancel or reschedule your appointment, please contact [facility]  Dept: [phone]  and follow the prompts.  Office hours are 8:00 a.m. to 4:30 p.m. Monday - Friday. Please note that voicemails left after 4:00 p.m. may not be returned until the following business day.  We are closed weekends and major holidays. You have access to a nurse at all times for urgent questions. Please call the main number to the clinic Dept: [phone] and follow the prompts.   For any non-urgent questions, you may also contact your provider using MyChart. We now offer e-Visits for anyone 57 and older to request care online for non-urgent symptoms. For details visit [email or web address].   Also download the MyChart app! Go to the app store, search "MyChart", open the app, select [facility], and log in with your MyChart username and password.  Due to Covid, a mask is required upon entering the hospital/clinic. If you do not have a mask, one will be given to you upon arrival. For doctor visits, patients may have 1 support person aged 1 or older with them. For treatment visits, patients cannot have anyone with them due to current Covid guidelines and our immunocompromised population.   Zoledronic Acid Injection (Hypercalcemia, Oncology) What is this medication? ZOLEDRONIC ACID (ZOE le dron ik AS id) slows calcium loss from bones. It high calcium levels in the blood from some kinds of cancer. It may be used in other people at risk for bone loss. This medicine may be used  for other purposes; ask your health care provider or pharmacist if you have questions. COMMON BRAND NAME(S): Zometa What should I tell my care team before I take this medication? They need to know if you have any of these conditions: cancer dehydration dental disease kidney disease liver disease low levels  of calcium in the blood lung or breathing disease (asthma) receiving steroids like dexamethasone or prednisone an unusual or allergic reaction to zoledronic acid, other medicines, foods, dyes, or preservatives pregnant or trying to get pregnant breast-feeding How should I use this medication? This drug is injected into a vein. It is given by a health care provider in a hospital or clinic setting. Talk to your health care provider about the use of this drug in children. Special care may be needed. Overdosage: If you think you have taken too much of this medicine contact a poison control center or emergency room at once. NOTE: This medicine is only for you. Do not share this medicine with others. What if I miss a dose? Keep appointments for follow-up doses. It is important not to miss your dose. Call your health care provider if you are unable to keep an appointment. What may interact with this medication? certain antibiotics given by injection NSAIDs, medicines for pain and inflammation, like ibuprofen or naproxen some diuretics like bumetanide, furosemide teriparatide thalidomide This list may not describe all possible interactions. Give your health care provider a list of all the medicines, herbs, non-prescription drugs, or dietary supplements you use. Also tell them if you smoke, drink alcohol, or use illegal drugs. Some items may interact with your medicine. What should I watch for while using this medication? Visit your health care provider for regular checks on your progress. It may be some time before you see the benefit from this drug. Some people who take this drug have severe bone, joint, or muscle pain. This drug may also increase your risk for jaw problems or a broken thigh bone. Tell your health care provider right away if you have severe pain in your jaw, bones, joints, or muscles. Tell you health care provider if you have any pain that does not go away or that gets worse. Tell  your dentist and dental surgeon that you are taking this drug. You should not have major dental surgery while on this drug. See your dentist to have a dental exam and fix any dental problems before starting this drug. Take good care of your teeth while on this drug. Make sure you see your dentist for regular follow-up appointments. You should make sure you get enough calcium and vitamin D while you are taking this drug. Discuss the foods you eat and the vitamins you take with your health care provider. Check with your health care provider if you have severe diarrhea, nausea, and vomiting, or if you sweat a lot. The loss of too much body fluid may make it dangerous for you to take this drug. You may need blood work done while you are taking this drug. Do not become pregnant while taking this drug. Women should inform their health care provider if they wish to become pregnant or think they might be pregnant. There is potential for serious harm to an unborn child. Talk to your health care provider for more information. What side effects may I notice from receiving this medication? Side effects that you should report to your doctor or health care provider as soon as possible: allergic reactions (skin rash, itching or hives; swelling of  the face, lips, or tongue) bone pain infection (fever, chills, cough, sore throat, pain or trouble passing urine) jaw pain, especially after dental work joint pain kidney injury (trouble passing urine or change in the amount of urine) low blood pressure (dizziness; feeling faint or lightheaded, falls; unusually weak or tired) low calcium levels (fast heartbeat; muscle cramps or pain; pain, tingling, or numbness in the hands or feet; seizures) low magnesium levels (fast, irregular heartbeat; muscle cramp or pain; muscle weakness; tremors; seizures) low red blood cell counts (trouble breathing; feeling faint; lightheaded, falls; unusually weak or tired) muscle  pain redness, blistering, peeling, or loosening of the skin, including inside the mouth severe diarrhea swelling of the ankles, feet, hands trouble breathing Side effects that usually do not require medical attention (report to your doctor or health care provider if they continue or are bothersome): anxious constipation coughing depressed mood eye irritation, itching, or pain fever general ill feeling or flu-like symptoms nausea pain, redness, or irritation at site where injected trouble sleeping This list may not describe all possible side effects. Call your doctor for medical advice about side effects. You may report side effects to FDA at 1-800-FDA-1088. Where should I keep my medication? This drug is given in a hospital or clinic. It will not be stored at home. NOTE: This sheet is a summary. It may not cover all possible information. If you have questions about this medicine, talk to your doctor, pharmacist, or health care provider.  2022 Elsevier/Gold Standard (2021-03-01 00:00:00)

## 2021-08-28 ENCOUNTER — Other Ambulatory Visit: Payer: Self-pay

## 2021-08-28 ENCOUNTER — Emergency Department (HOSPITAL_COMMUNITY): Payer: Medicare HMO

## 2021-08-28 ENCOUNTER — Emergency Department (HOSPITAL_COMMUNITY)
Admission: EM | Admit: 2021-08-28 | Discharge: 2021-08-28 | Disposition: A | Discharge status: Home / Self Care | Payer: Medicare HMO | Attending: Emergency Medicine | Admitting: Emergency Medicine

## 2021-08-28 ENCOUNTER — Encounter (HOSPITAL_COMMUNITY): Payer: Self-pay | Admitting: Emergency Medicine

## 2021-08-28 DIAGNOSIS — I10 Essential (primary) hypertension: Secondary | ICD-10-CM | POA: Insufficient documentation

## 2021-08-28 DIAGNOSIS — Z7982 Long term (current) use of aspirin: Secondary | ICD-10-CM | POA: Diagnosis not present

## 2021-08-28 DIAGNOSIS — M79644 Pain in right finger(s): Secondary | ICD-10-CM | POA: Diagnosis not present

## 2021-08-28 DIAGNOSIS — W010XXA Fall on same level from slipping, tripping and stumbling without subsequent striking against object, initial encounter: Secondary | ICD-10-CM | POA: Insufficient documentation

## 2021-08-28 DIAGNOSIS — Z79899 Other long term (current) drug therapy: Secondary | ICD-10-CM | POA: Insufficient documentation

## 2021-08-28 DIAGNOSIS — S62614A Displaced fracture of proximal phalanx of right ring finger, initial encounter for closed fracture: Secondary | ICD-10-CM | POA: Diagnosis not present

## 2021-08-28 DIAGNOSIS — S6991XA Unspecified injury of right wrist, hand and finger(s), initial encounter: Secondary | ICD-10-CM | POA: Diagnosis present

## 2021-08-28 NOTE — ED Provider Notes (Signed)
[facility] DEPT Provider Note   CSN: [id number] Arrival date & time: 08/28/21  0730     History  Chief Complaint  Patient presents with   Finger Injury    AMERE IOTT is a 76 y.o. female. With past medical history of HTN, osteopenia, multiple myeloma who presents to the emergency department with finger injury.  States last night she was going upstairs when she tripped and fell landing on her right hand. States she caught her ring finger on the steps. States that she attempted to ice last night but swelling significantly increased overnight. No other injuries. Did not hit head or lose consciousness.   HPI     Home Medications Prior to Admission medications   Medication Sig Start Date End Date Taking? Authorizing Provider  acyclovir (ZOVIRAX) 400 MG tablet TAKE 1 TABLET BY MOUTH TWICE A DAY 08/15/21   [name], MD  aspirin 325 MG tablet Take 325 mg by mouth daily.    [provider]  calcium carbonate (TUMS - DOSED IN MG ELEMENTAL CALCIUM) 500 MG chewable tablet Chew 1 tablet by mouth daily.    [provider]  Carfilzomib (KYPROLIS IV) Inject into the vein once a week.    [provider]  Cholecalciferol 25 MCG (1000 UT) tablet Take 1,000 Units by mouth daily.    [provider]  Cyanocobalamin (VITAMIN B12 PO) Take 1 tablet by mouth daily.    [provider]  loperamide (IMODIUM) 2 MG capsule Take by mouth as needed for diarrhea or loose stools. As needed only    [provider]  metoprolol tartrate (LOPRESSOR) 25 MG tablet TAKE 1.5 TABLETS (37.5 MG TOTAL) BY MOUTH 2 (TWO) TIMES DAILY. 12/30/20   [name], MD  mirtazapine (REMERON) 15 MG tablet TAKE 1 TABLET BY MOUTH EVERYDAY AT BEDTIME 04/18/21   [name], MD  Multiple Vitamin (MULTIVITAMIN WITH MINERALS) TABS tablet Take 1 tablet by mouth daily.    [provider]  Multiple Vitamins-Minerals (PRESERVISION AREDS 2 PO) Take  1 tablet by mouth 2 (two) times daily.    [provider]  ondansetron (ZOFRAN) 8 MG tablet Take 1 tablet (8 mg total) by mouth every 8 (eight) hours as needed (Nausea or vomiting). 04/19/21   [name], MD  pantoprazole (PROTONIX) 40 MG tablet TAKE 1 TABLET BY MOUTH 2 TIMES DAILY. TAKE 30-60 MINUTES BEFORE BREAKFAST AND DINNER. 08/15/21   Gatha Mayer, MD  prochlorperazine (COMPAZINE) 10 MG tablet Take 1 tablet (10 mg total) by mouth every 6 (six) hours as needed (Nausea or vomiting). 04/19/21   [name], MD      Allergies    Patient has no known allergies.    Review of Systems   Review of Systems  Musculoskeletal:  Positive for arthralgias and joint swelling.  All other systems reviewed and are negative.  Physical Exam Updated Vital Signs BP (!) 173/80 (BP Location: Right Arm)    Pulse 88    Temp 98.3 F (36.8 C) (Oral)    Resp 18    SpO2 96%  Physical Exam Vitals and nursing note reviewed.  Constitutional:      General: She is not in acute distress.    Appearance: Normal appearance.  HENT:     Head: Normocephalic and atraumatic.  Eyes:     General: No scleral icterus. Cardiovascular:     Pulses: Normal pulses.  Pulmonary:     Effort: Pulmonary effort is normal. No  respiratory distress.  Musculoskeletal:        General: Swelling, tenderness, deformity and signs of injury present.     Right hand: Swelling, deformity, tenderness and bony tenderness present. Decreased range of motion. Decreased strength of thumb/finger opposition. Normal sensation. Normal capillary refill. Normal pulse.     Left hand: Normal.     Cervical back: Neck supple.     Comments: Swelling to right fourth digit with ecchymosis and decreased range of motion. Swelling and ecchymosis more significant on volar aspect. Sensation intact. Unable to flex digit. Pulse 2+. Cap refil <2 sec   Skin:    General: Skin is warm and dry.     Capillary Refill: Capillary refill takes less than 2 seconds.      Findings: Bruising present. No rash.  Neurological:     General: No focal deficit present.     Mental Status: She is alert and oriented to person, place, and time. Mental status is at baseline.     Sensory: No sensory deficit.  Psychiatric:        Mood and Affect: Mood normal.        Behavior: Behavior normal.        Thought Content: Thought content normal.        Judgment: Judgment normal.    ED Results / Procedures / Treatments   Labs (all labs ordered are listed, but only abnormal results are displayed) Labs Reviewed - No data to display  EKG None  Radiology DG Hand Complete Right  Result Date: 08/28/2021 CLINICAL DATA:  76 year old female with history of trauma from a fall presenting with finger pain. EXAM: RIGHT HAND - COMPLETE 3+ VIEW COMPARISON:  No priors. FINDINGS: Three views of the right hand demonstrate an acute displaced fracture of the distal fourth proximal phalanx. It is uncertain whether or not the fracture line extends to the articular surface. However, there is nearly 1 shaft width of volar displacement of the distal fracture fragment, best appreciated on the lateral projection. Extensive surrounding soft tissue swelling is noted. Multifocal joint space narrowing, subchondral sclerosis, subchondral cyst formation and osteophyte formation is noted in the DIP and PIP joints, as well as the first [facility] joint, compatible with osteoarthritis. IMPRESSION: 1. Acute displaced fracture of the fourth proximal phalanx in the distal metaphyseal region, with approximately 1 shaft width of volar displacement. The possibility of intra-articular extension is not excluded. 2. Osteoarthritis, as above. Electronically Signed   By: Vinnie Langton M.D.   On: 08/28/2021 08:23    Procedures Procedures    Medications Ordered in ED Medications - No data to display  ED Course/ Medical Decision Making/ A&P                           Medical Decision Making Amount and/or Complexity of Data  Reviewed Radiology: ordered.  This patient presents to the ED for concern of finger injury, this involves an extensive number of treatment options, and is a complaint that carries with it a high risk of complications and morbidity.  The differential diagnosis includes fracture, dislocation   Co morbidities that complicate the patient evaluation Multiple myeloma  Additional history obtained:  Additional history obtained from: None External records from outside source obtained and reviewed including: None required  Imaging Studies ordered:  I ordered imaging studies which included x-ray.  I independently reviewed & interpreted imaging & am in agreement with radiology impression. Imaging shows: Acute displaced fracture of  the fourth proximal phalanx and the distal metaphyseal region, with approximately 1 shaft with volar displacement  Medications  Offered pain medication which patient declined   Consultations: I requested consultation with the orthopedic surgeon Caralyn Guile, MD,  and discussed lab and imaging findings as well as pertinent plan - they recommend: ulnar gutter splint and f/u in 1 week in office   ED Course: 76 year old female who presents emergency department after fall.  She has obvious bruising, swelling to the right fourth digit that extends to the proximal volar surface of the palm.  She has limited range of motion due to swelling and pain. Imaging as above Discussed with Dr. Caralyn Guile, orthopedic surgery, who recommends ulnar gutter splinting and follow-up next week in office.  Patient has been placed in ulnar gutter splint, have reevaluated the splint.  It appears to be an appropriate positioning.  Patient is instructed to use Tylenol and Motrin as needed for pain relief.  She declines pain medication.  Also instructed to elevate.  Given return precautions for significant increase in pain.  Otherwise we will follow-up in 1 week with [facility].  She is agreeable to this  plan.  After consideration of the diagnostic results and the patients response to treatment, I feel that the patent would benefit from discharge. The patient has been appropriately medically screened and/or stabilized in the ED. I have low suspicion for any other emergent medical condition which would require further screening, evaluation or treatment in the ED or require inpatient management. The patient is overall well appearing and non-toxic in appearance. They are hemodynamically stable at time of discharge.   Final Clinical Impression(s) / ED Diagnoses Final diagnoses:  Closed displaced fracture of proximal phalanx of right ring finger, initial encounter    Rx / DC Orders ED Discharge Orders          Ordered    Ambulatory referral to Orthopedic Surgery        08/28/21 1136              Mickie Hillier, PA-C 08/28/21 1308    Lajean Saver, MD 08/28/21 1332

## 2021-08-28 NOTE — ED Notes (Signed)
Pt ambulatory without assistance.  

## 2021-08-28 NOTE — ED Triage Notes (Signed)
Pt reports tripping up the stairs last night and catching herself with her hand. Pt reports right ring finger swelling and pain. Denies hitting her head.  ?

## 2021-08-28 NOTE — ED Notes (Signed)
I provided reinforced discharge education based off of discharge instructions. Pt acknowledged and understood my education. Pt had no further questions/concerns for provider/myself.  °

## 2021-08-28 NOTE — Discharge Instructions (Signed)
You were seen in the emergency department today for a fracture of your right ring finger.  We placed a splint.  You will follow-up with [facility].  I have sent a referral for them to call you.  If they do not call you by Tuesday please give them a call to set up an appointment.  In the meantime you may use Tylenol or ibuprofen as needed for swelling.  Please keep the arm elevated when resting to promote swelling to go down.  If you begin to have severe increase in pain please return to the emergency department. ?

## 2021-08-28 NOTE — Progress Notes (Signed)
Orthopedic Tech Progress Note ?Patient Details:  ?Jean Davidson ?07/24/1945 ?[id number] ? ?Ortho Devices ?Type of Ortho Device: Ace wrap, Ulna gutter splint ?Ortho Device/Splint Location: right ?Ortho Device/Splint Interventions: Application ?  ?Post Interventions ?Patient Tolerated: Well ?Instructions Provided: Care of device ? ?[name] Pink ?08/28/2021, 11:05 AM ? ?

## 2021-08-30 ENCOUNTER — Encounter: Payer: Self-pay | Admitting: Hematology and Oncology

## 2021-08-31 ENCOUNTER — Telehealth: Payer: Self-pay | Admitting: *Deleted

## 2021-08-31 NOTE — Telephone Encounter (Addendum)
See other entry 

## 2021-08-31 NOTE — Telephone Encounter (Signed)
This RN called pt and informed her of MD recommendations. ? ?Jean Davidson verbalized understanding and will stop her aspirin as well as contact Dr Vanetta Shawl to schedule surgery hopefully for this Friday ( he only does surgery on Fridays).  ? ?Appointments for this office canceled for 09/02/2021. ? ?Pt understands to follow up as scheduled on 09/16/2021 with appts in this office. ? ? ?

## 2021-08-31 NOTE — Telephone Encounter (Signed)
Pt called wanting to let Dr Alvy Bimler know she fell and broke her ring finger on her right hand. ? ?She was seen by Dr Amedeo Plenty who has presently stablized it and has given her choices of therapy including surgery. ? ?She would like Dr Alvy Bimler to review his notes and recommendations ( in care everywhere ) and advise relating to her current therapy. ? ?This RN informed her above would be forwarded to Dr Alvy Bimler for review. ?

## 2021-08-31 NOTE — Telephone Encounter (Signed)
I suggest she proceed with surgery ASAP ?Tell her to hold aspirin ?We can reschedule her chemo this week ?

## 2021-09-02 ENCOUNTER — Ambulatory Visit: Payer: Medicare HMO

## 2021-09-02 ENCOUNTER — Other Ambulatory Visit: Payer: Medicare HMO

## 2021-09-06 ENCOUNTER — Encounter (INDEPENDENT_AMBULATORY_CARE_PROVIDER_SITE_OTHER): Payer: Medicare HMO

## 2021-09-06 ENCOUNTER — Ambulatory Visit (INDEPENDENT_AMBULATORY_CARE_PROVIDER_SITE_OTHER): Payer: Medicare HMO | Admitting: Vascular Surgery

## 2021-09-15 MED FILL — Dexamethasone Sodium Phosphate Inj 100 MG/10ML: INTRAMUSCULAR | Qty: 2 | Status: AC

## 2021-09-16 ENCOUNTER — Inpatient Hospital Stay: Payer: Medicare HMO | Admitting: Hematology and Oncology

## 2021-09-16 ENCOUNTER — Encounter: Payer: Self-pay | Admitting: Hematology and Oncology

## 2021-09-16 ENCOUNTER — Inpatient Hospital Stay: Payer: Medicare HMO

## 2021-09-16 ENCOUNTER — Other Ambulatory Visit: Payer: Self-pay

## 2021-09-16 DIAGNOSIS — D469 Myelodysplastic syndrome, unspecified: Secondary | ICD-10-CM

## 2021-09-16 DIAGNOSIS — C9002 Multiple myeloma in relapse: Secondary | ICD-10-CM

## 2021-09-16 DIAGNOSIS — R5381 Other malaise: Secondary | ICD-10-CM

## 2021-09-16 DIAGNOSIS — D61818 Other pancytopenia: Secondary | ICD-10-CM

## 2021-09-16 DIAGNOSIS — Z5111 Encounter for antineoplastic chemotherapy: Secondary | ICD-10-CM | POA: Diagnosis not present

## 2021-09-16 LAB — CMP (CANCER CENTER ONLY)
ALT: 8 U/L (ref 0–44)
AST: 12 U/L — ABNORMAL LOW (ref 15–41)
Albumin: 3.6 g/dL (ref 3.5–5.0)
Alkaline Phosphatase: 60 U/L (ref 38–126)
Anion gap: 4 — ABNORMAL LOW (ref 5–15)
BUN: 17 mg/dL (ref 8–23)
CO2: 28 mmol/L (ref 22–32)
Calcium: 8.6 mg/dL — ABNORMAL LOW (ref 8.9–10.3)
Chloride: 108 mmol/L (ref 98–111)
Creatinine: 0.73 mg/dL (ref 0.44–1.00)
GFR, Estimated: >60 mL/min (ref >60)
Glucose, Bld: 91 mg/dL (ref 70–99)
Potassium: 4.2 mmol/L (ref 3.5–5.1)
Sodium: 140 mmol/L (ref 135–145)
Total Bilirubin: 0.3 mg/dL (ref 0.3–1.2)
Total Protein: 6 g/dL — ABNORMAL LOW (ref 6.5–8.1)

## 2021-09-16 LAB — CBC WITH DIFFERENTIAL (CANCER CENTER ONLY)
Abs Immature Granulocytes: 0.01 10*3/uL (ref 0.00–0.07)
Basophils Absolute: 0 10*3/uL (ref 0.0–0.1)
Basophils Relative: 1 %
Eosinophils Absolute: 0 10*3/uL (ref 0.0–0.5)
Eosinophils Relative: 0 %
HCT: 35.4 % — ABNORMAL LOW (ref 36.0–46.0)
Hemoglobin: 11.5 g/dL — ABNORMAL LOW (ref 12.0–15.0)
Immature Granulocytes: 0 %
Lymphocytes Relative: 26 %
Lymphs Abs: 1.1 10*3/uL (ref 0.7–4.0)
MCH: 33.8 pg (ref 26.0–34.0)
MCHC: 32.5 g/dL (ref 30.0–36.0)
MCV: 104.1 fL — ABNORMAL HIGH (ref 80.0–100.0)
Monocytes Absolute: 0.5 10*3/uL (ref 0.1–1.0)
Monocytes Relative: 11 %
Neutro Abs: 2.7 10*3/uL (ref 1.7–7.7)
Neutrophils Relative %: 62 %
Platelet Count: 130 10*3/uL — ABNORMAL LOW (ref 150–400)
RBC: 3.4 MIL/uL — ABNORMAL LOW (ref 3.87–5.11)
RDW: 12.1 % (ref 11.5–15.5)
WBC Count: 4.3 10*3/uL (ref 4.0–10.5)
nRBC: 0 % (ref 0.0–0.2)

## 2021-09-16 MED ORDER — SODIUM CHLORIDE 0.9 % IV SOLN: Freq: Once | INTRAVENOUS | Status: AC

## 2021-09-16 MED ORDER — HEPARIN SOD (PORK) LOCK FLUSH 100 UNIT/ML IV SOLN
500.0000 [IU] | Freq: Once | INTRAVENOUS | Status: AC | PRN
  Administered 2021-09-16: 500 [IU]

## 2021-09-16 MED ORDER — SODIUM CHLORIDE 0.9% FLUSH
10.0000 mL | INTRAVENOUS | Status: DC | PRN
  Administered 2021-09-16: 10 mL

## 2021-09-16 MED ORDER — SODIUM CHLORIDE 0.9 % IV SOLN
20.0000 mg | Freq: Once | INTRAVENOUS | Status: AC
  Administered 2021-09-16: 20 mg via INTRAVENOUS
  Filled 2021-09-16: qty 2

## 2021-09-16 MED ORDER — DEXTROSE 5 % IV SOLN
35.0000 mg/m2 | Freq: Once | INTRAVENOUS | Status: AC
  Administered 2021-09-16: 60 mg via INTRAVENOUS
  Filled 2021-09-16: qty 30

## 2021-09-16 MED ORDER — SODIUM CHLORIDE 0.9% FLUSH
10.0000 mL | Freq: Once | INTRAVENOUS | Status: AC
  Administered 2021-09-16: 10 mL

## 2021-09-16 NOTE — Assessment & Plan Note (Signed)
Overall, she has positive response to treatment ?So far, she tolerated 36 mg per metered square of kyprolis ?She had recent surgery ?She has mild pancytopenia ?I will continue to reassess ?Next month, if she tolerates treatment well, I plan to eventually increase all the way up to 56 mg per metered square ? ?

## 2021-09-16 NOTE — Assessment & Plan Note (Signed)
She had recent physical deconditioning due to surgery and other health issues ?I encouraged the patient to exercise as tolerated ?

## 2021-09-16 NOTE — Patient Instructions (Signed)
[place]  Discharge Instructions: ?Thank you for choosing [facility] to provide your oncology and hematology care.  ? ?If you have a lab appointment with the [facility], please go directly to the [facility] and check in at the registration area. ?  ?Wear comfortable clothing and clothing appropriate for easy access to any Portacath or PICC line.  ? ?We strive to give you quality time with your provider. You may need to reschedule your appointment if you arrive late (15 or more minutes).  Arriving late affects you and other patients whose appointments are after yours.  Also, if you miss three or more appointments without notifying the office, you may be dismissed from the clinic at the provider?s discretion.    ?  ?For prescription refill requests, have your pharmacy contact our office and allow 72 hours for refills to be completed.   ? ?Today you received the following chemotherapy and/or immunotherapy agent: Carfilzomib (Kyprolis) ?  ?To help prevent nausea and vomiting after your treatment, we encourage you to take your nausea medication as directed. ? ?BELOW ARE SYMPTOMS THAT SHOULD BE REPORTED IMMEDIATELY: ?*FEVER GREATER THAN 100.4 F (38 ?C) OR HIGHER ?*CHILLS OR SWEATING ?*NAUSEA AND VOMITING THAT IS NOT CONTROLLED WITH YOUR NAUSEA MEDICATION ?*UNUSUAL SHORTNESS OF BREATH ?*UNUSUAL BRUISING OR BLEEDING ?*URINARY PROBLEMS (pain or burning when urinating, or frequent urination) ?*BOWEL PROBLEMS (unusual diarrhea, constipation, pain near the anus) ?TENDERNESS IN MOUTH AND THROAT WITH OR WITHOUT PRESENCE OF ULCERS (sore throat, sores in mouth, or a toothache) ?UNUSUAL RASH, SWELLING OR PAIN  ?UNUSUAL VAGINAL DISCHARGE OR ITCHING  ? ?Items with * indicate a potential emergency and should be followed up as soon as possible or go to the Emergency Department if any problems should occur. ? ?Please show the CHEMOTHERAPY ALERT CARD or IMMUNOTHERAPY ALERT CARD at  check-in to the Emergency Department and triage nurse. ? ?Should you have questions after your visit or need to cancel or reschedule your appointment, please contact [facility]  Dept: [phone]  and follow the prompts.  Office hours are 8:00 a.m. to 4:30 p.m. Monday - Friday. Please note that voicemails left after 4:00 p.m. may not be returned until the following business day.  We are closed weekends and major holidays. You have access to a nurse at all times for urgent questions. Please call the main number to the clinic Dept: [phone] and follow the prompts. ? ? ?For any non-urgent questions, you may also contact your provider using MyChart. We now offer e-Visits for anyone 77 and older to request care online for non-urgent symptoms. For details visit [email or web address]. ?  ?Also download the MyChart app! Go to the app store, search "MyChart", open the app, select [facility], and log in with your MyChart username and password. ? ?Due to Covid, a mask is required upon entering the hospital/clinic. If you do not have a mask, one will be given to you upon arrival. For doctor visits, patients may have 1 support person aged 40 or older with them. For treatment visits, patients cannot have anyone with them due to current Covid guidelines and our immunocompromised population.  ? ?Zoledronic Acid Injection (Hypercalcemia, Oncology) ?What is this medication? ?ZOLEDRONIC ACID (ZOE le dron ik AS id) slows calcium loss from bones. It high calcium levels in the blood from some kinds of cancer. It may be used in other people at risk for bone loss. ?This medicine may be used  for other purposes; ask your health care provider or pharmacist if you have questions. ?COMMON BRAND NAME(S): Zometa ?What should I tell my care team before I take this medication? ?They need to know if you have any of these conditions: ?cancer ?dehydration ?dental disease ?kidney disease ?liver disease ?low levels  of calcium in the blood ?lung or breathing disease (asthma) ?receiving steroids like dexamethasone or prednisone ?an unusual or allergic reaction to zoledronic acid, other medicines, foods, dyes, or preservatives ?pregnant or trying to get pregnant ?breast-feeding ?How should I use this medication? ?This drug is injected into a vein. It is given by a health care provider in a hospital or clinic setting. ?Talk to your health care provider about the use of this drug in children. Special care may be needed. ?Overdosage: If you think you have taken too much of this medicine contact a poison control center or emergency room at once. ?NOTE: This medicine is only for you. Do not share this medicine with others. ?What if I miss a dose? ?Keep appointments for follow-up doses. It is important not to miss your dose. Call your health care provider if you are unable to keep an appointment. ?What may interact with this medication? ?certain antibiotics given by injection ?NSAIDs, medicines for pain and inflammation, like ibuprofen or naproxen ?some diuretics like bumetanide, furosemide ?teriparatide ?thalidomide ?This list may not describe all possible interactions. Give your health care provider a list of all the medicines, herbs, non-prescription drugs, or dietary supplements you use. Also tell them if you smoke, drink alcohol, or use illegal drugs. Some items may interact with your medicine. ?What should I watch for while using this medication? ?Visit your health care provider for regular checks on your progress. It may be some time before you see the benefit from this drug. ?Some people who take this drug have severe bone, joint, or muscle pain. This drug may also increase your risk for jaw problems or a broken thigh bone. Tell your health care provider right away if you have severe pain in your jaw, bones, joints, or muscles. Tell you health care provider if you have any pain that does not go away or that gets worse. ?Tell  your dentist and dental surgeon that you are taking this drug. You should not have major dental surgery while on this drug. See your dentist to have a dental exam and fix any dental problems before starting this drug. Take good care of your teeth while on this drug. Make sure you see your dentist for regular follow-up appointments. ?You should make sure you get enough calcium and vitamin D while you are taking this drug. Discuss the foods you eat and the vitamins you take with your health care provider. ?Check with your health care provider if you have severe diarrhea, nausea, and vomiting, or if you sweat a lot. The loss of too much body fluid may make it dangerous for you to take this drug. ?You may need blood work done while you are taking this drug. ?Do not become pregnant while taking this drug. Women should inform their health care provider if they wish to become pregnant or think they might be pregnant. There is potential for serious harm to an unborn child. Talk to your health care provider for more information. ?What side effects may I notice from receiving this medication? ?Side effects that you should report to your doctor or health care provider as soon as possible: ?allergic reactions (skin rash, itching or hives; swelling of  the face, lips, or tongue) ?bone pain ?infection (fever, chills, cough, sore throat, pain or trouble passing urine) ?jaw pain, especially after dental work ?joint pain ?kidney injury (trouble passing urine or change in the amount of urine) ?low blood pressure (dizziness; feeling faint or lightheaded, falls; unusually weak or tired) ?low calcium levels (fast heartbeat; muscle cramps or pain; pain, tingling, or numbness in the hands or feet; seizures) ?low magnesium levels (fast, irregular heartbeat; muscle cramp or pain; muscle weakness; tremors; seizures) ?low red blood cell counts (trouble breathing; feeling faint; lightheaded, falls; unusually weak or tired) ?muscle  pain ?redness, blistering, peeling, or loosening of the skin, including inside the mouth ?severe diarrhea ?swelling of the ankles, feet, hands ?trouble breathing ?Side effects that usually do not require medical atten

## 2021-09-16 NOTE — Assessment & Plan Note (Signed)
The cause of her pancytopenia is multifactorial, likely due to recent surgery and treatment ?She is not symptomatic ?Observe only ?

## 2021-09-16 NOTE — Progress Notes (Signed)
[facility] ?OFFICE PROGRESS NOTE ? ?Patient Care Team: ?[name], [name], MD as PCP - General (Internal Medicine) ?[name], MD as PCP - Cardiology (Cardiology) ?[name], MD as Referring Physician (Hematology and Oncology) ?[name], MD as Consulting Physician (Internal Medicine) ?[name], [name], MD as Consulting Physician (Infectious Diseases) ?[name], MD as Consulting Physician (Hematology and Oncology) ?[name], NP as Nurse Practitioner (Hematology and Oncology) ?[name], MD as Consulting Physician (Hematology and Oncology) ? ?ASSESSMENT & PLAN:  ?Multiple myeloma in relapse ([place]) ?Overall, she has positive response to treatment ?So far, she tolerated 36 mg per metered square of kyprolis ?She had recent surgery ?She has mild pancytopenia ?I will continue to reassess ?Next month, if she tolerates treatment well, I plan to eventually increase all the way up to 56 mg per metered square ? ? ?Pancytopenia, acquired ([place]) ?The cause of her pancytopenia is multifactorial, likely due to recent surgery and treatment ?She is not symptomatic ?Observe only ? ?Physical deconditioning ?She had recent physical deconditioning due to surgery and other health issues ?I encouraged the patient to exercise as tolerated ? ?No orders of the defined types were placed in this encounter. ? ? ?All questions were answered. The patient knows to call the clinic with any problems, questions or concerns. ?The total time spent in the appointment was 25 minutes encounter with patients including review of chart and various tests results, discussions about plan of care and coordination of care plan ?  ?[name], MD ?09/16/2021 9:01 AM ? ?INTERVAL HISTORY: ?Please see below for problem oriented charting. 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 ?REVIEW OF SYSTEMS:   ?Constitutional: Denies fevers, chills or abnormal weight loss ?Eyes: Denies blurriness of vision ?Ears, nose, mouth, throat, and face: Denies mucositis or sore throat ?Respiratory: Denies cough, dyspnea or wheezes ?Cardiovascular: Denies palpitation, chest discomfort or lower extremity swelling ?Gastrointestinal:  Denies nausea, heartburn or change in bowel habits ?Skin: Denies abnormal skin rashes ?Lymphatics: Denies new lymphadenopathy or easy bruising ?Neurological:Denies numbness, tingling or new weaknesses ?Behavioral/Psych: Mood is stable, no new changes  ?All other systems were reviewed with the patient and are negative. ? ?I have reviewed the past medical history, past surgical history, social history and family history with the patient and they are unchanged from previous note. ? ?ALLERGIES:  has No Known Allergies. ? ?MEDICATIONS:  ?Current Outpatient Medications  ?Medication Sig Dispense Refill  ? acyclovir (ZOVIRAX) 400 MG tablet TAKE 1 TABLET BY MOUTH TWICE A DAY 180 tablet 11  ? aspirin 325 MG tablet Take 325 mg by mouth daily.    ? calcium carbonate (TUMS - DOSED IN MG ELEMENTAL CALCIUM) 500 MG chewable tablet Chew 1 tablet by mouth daily.    ? Carfilzomib (KYPROLIS IV) Inject into the vein once a week.    ? Cholecalciferol 25 MCG (1000 UT) tablet Take 1,000 Units by mouth daily.    ? Cyanocobalamin (VITAMIN B12 PO) Take 1 tablet by mouth daily.    ? loperamide (IMODIUM) 2 MG capsule Take by mouth as needed for diarrhea or loose stools. As needed only    ? metoprolol tartrate (LOPRESSOR) 25 MG tablet TAKE 1.5 TABLETS (37.5  MG TOTAL) BY MOUTH 2 (TWO) TIMES DAILY. 270 tablet 1  ? mirtazapine (REMERON) 15 MG tablet TAKE 1 TABLET BY MOUTH EVERYDAY AT BEDTIME 90 tablet 1  ? Multiple Vitamin (MULTIVITAMIN WITH  MINERALS) TABS tablet Take 1 tablet by mouth daily.    ? Multiple Vitamins-Minerals (PRESERVISION AREDS 2 PO) Take 1 tablet by mouth 2 (two) times daily.    ? ondansetron (ZOFRAN) 8 MG tablet Take 1 tablet (8 mg total) by mouth every 8 (eight) hours as needed (Nausea or vomiting). 30 tablet 1  ? pantoprazole (PROTONIX) 40 MG tablet TAKE 1 TABLET BY MOUTH 2 TIMES DAILY. TAKE 30-60 MINUTES BEFORE BREAKFAST AND DINNER. 180 tablet 2  ? prochlorperazine (COMPAZINE) 10 MG tablet Take 1 tablet (10 mg total) by mouth every 6 (six) hours as needed (Nausea or vomiting). 30 tablet 1  ? ?No current facility-administered medications for this visit.  ? ?Facility-Administered Medications Ordered in Other Visits  ?Medication Dose Route Frequency Provider Last Rate Last Admin  ? carfilzomib (KYPROLIS) 60 mg in dextrose 5 % 100 mL chemo infusion  35 mg/m2 (Treatment Plan Recorded) Intravenous Once Alvy Bimler, Giorgi Debruin, MD      ? dexamethasone (DECADRON) 20 mg in sodium chloride 0.9 % 50 mL IVPB  20 mg Intravenous Once [name], MD      ? ? ?SUMMARY OF ONCOLOGIC HISTORY: ?Oncology History Overview Note  ?Multiple myeloma, Ig A Lambda, M spike 3.54 grams, Calcium 9.2, Creatinine 0.8, Beta 2 microglobulin 4.52, IgA 4840 mg/dL, lambda light chain 75.4, albumin 3.6, hemoglobin 9.7, platelet 115 ? ?  Primary site: Multiple Myeloma ?  Staging method: AJCC 6th Edition ?  Clinical: Stage IIA signed by [name], MD on 11/07/2013  2:46 PM ?  Summary: Stage IIA ?S/p Allo [facility] ?Progressed on revlimid, pomalyst, daratumumab and velcade ? ? ?  ?Multiple myeloma in relapse [facility])  ?10/31/2013 Bone Marrow Biopsy  ? Bone marrow biopsy confirmed multiple myeloma with 40% bone marrow involvement. Skeletal survey showed minimal lesions in her score with generalized demineralization ?  ?11/10/2013 - 02/13/2014 Chemotherapy  ? The patient is started on induction chemotherapy with weekly dexamethasone 40 mg by mouth as well as Velcade subcutaneous  injection on days 1, 4, 8 and 11. On 11/21/2013, she was started on monthly Zometa. ?  ?12/23/2013 Adverse Reaction  ? The dose of Velcade was reduced due to thrombocytopenia. ?  ?01/28/2014 - 04/07/2014 Chemotherapy  ? Revlimid is added. Treatment was discontinued due to lack of response. ?  ?02/24/2014 - 04/07/2014 Chemotherapy  ? Due to worsening peripheral neuropathy, Velcade injection is changed to once a week. Revlimid was given 21 days on, 7 days off. ?  ?04/07/2014 - 04/10/2014 Chemotherapy  ? Revlimid was discontinued due to lack of response. Chemotherapy was changed back to Velcade injection twice a week, 2 weeks on 1 week off. Her treatment was switched to to minimum response ?  ?04/20/2014 - 06/02/2014 Chemotherapy  ? chemotherapy is switched to Carfilzomib, Cytoxan and dexamethasone. ?  ?04/22/2014 Procedure  ? she has placement of port for chemotherapy. ?  ?06/01/2014 Tumor Marker  ? Bloodwork show that she has greater than partial response ?  ?06/23/2014 Bone Marrow Biopsy  ? Bone marrow biopsy show 5-10% residual plasma cells, normal cytogenetics and FISH ?  ?07/07/2014 Procedure  ? She had stem cell collection ?  ?07/22/2014 - 07/22/2014 Chemotherapy  ? She had high-dose chemotherapy with melphalan ?  ?07/23/2014 Bone Marrow Transplant  ?  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  ?10/20/2014 - 03/24/2015 Chemotherapy  ?  she received chemotherapy with Kyprolis, Revlimid and dexamethasone ?  ?10/22/2014 Procedure  ? She has port placement ?  ?01/19/2015 Tumor Marker  ? IgA lambda M spike at 0.4 g  ?  ?01/20/2015 Miscellaneous  ? 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  ?02/02/2015 Miscellaneous  ? She received GCSF for severe neutropenia ?  ?02/26/2015 Bone Marrow Biopsy  ?  she had bone marrow biopsy done at [facility] which showed mild pancytopenia but not diagnostic for myelodysplastic syndrome or multiple myeloma ?  ?07/22/2015 - 09/21/2015 Chemotherapy  ? She is receiving Daratumumab at [facility]  due to relapsed myeloma ?  ?08/03/2015 - 08/06/2015 Hospital Admission  ? She was admitted to the hospital for neutropenic fever. No cource was found and fever resolved with IV vancomycin and meropenem ?  ?3/20/2

## 2021-09-19 LAB — KAPPA/LAMBDA LIGHT CHAINS
Kappa free light chain: 6.7 mg/L (ref 3.3–19.4)
Kappa, lambda light chain ratio: 0.09 — ABNORMAL LOW (ref 0.26–1.65)
Lambda free light chains: 70.6 mg/L — ABNORMAL HIGH (ref 5.7–26.3)

## 2021-09-21 LAB — MULTIPLE MYELOMA PANEL, SERUM
Albumin SerPl Elph-Mcnc: 3.2 g/dL (ref 2.9–4.4)
Albumin/Glob SerPl: 1.4 (ref 0.7–1.7)
Alpha 1: 0.2 g/dL (ref 0.0–0.4)
Alpha2 Glob SerPl Elph-Mcnc: 0.7 g/dL (ref 0.4–1.0)
B-Globulin SerPl Elph-Mcnc: 0.7 g/dL (ref 0.7–1.3)
Gamma Glob SerPl Elph-Mcnc: 0.7 g/dL (ref 0.4–1.8)
Globulin, Total: 2.3 g/dL (ref 2.2–3.9)
IgA: 278 mg/dL (ref 64–422)
IgG (Immunoglobin G), Serum: 438 mg/dL — ABNORMAL LOW (ref 586–1602)
IgM (Immunoglobulin M), Srm: 22 mg/dL — ABNORMAL LOW (ref 26–217)
M Protein SerPl Elph-Mcnc: 0.3 g/dL — ABNORMAL HIGH
Total Protein ELP: 5.5 g/dL — ABNORMAL LOW (ref 6.0–8.5)

## 2021-09-22 ENCOUNTER — Telehealth: Payer: Self-pay

## 2021-09-22 NOTE — Telephone Encounter (Signed)
-----   Message from [name], MD sent at 09/21/2021  3:38 PM EDT ----- ?PLs let her know myeloma panel is stable ? ?

## 2021-09-22 NOTE — Telephone Encounter (Signed)
Called and given below message. She verbalized understanding. 

## 2021-09-23 ENCOUNTER — Inpatient Hospital Stay: Payer: Medicare HMO

## 2021-09-23 ENCOUNTER — Other Ambulatory Visit: Payer: Self-pay

## 2021-09-23 VITALS — BP 147/73 | HR 64 | Temp 98.5°F | Resp 17 | Wt 166.2 lb

## 2021-09-23 DIAGNOSIS — D469 Myelodysplastic syndrome, unspecified: Secondary | ICD-10-CM

## 2021-09-23 DIAGNOSIS — C9002 Multiple myeloma in relapse: Secondary | ICD-10-CM

## 2021-09-23 DIAGNOSIS — Z5111 Encounter for antineoplastic chemotherapy: Secondary | ICD-10-CM | POA: Diagnosis not present

## 2021-09-23 LAB — CBC WITH DIFFERENTIAL (CANCER CENTER ONLY)
Abs Immature Granulocytes: 0.02 10*3/uL (ref 0.00–0.07)
Basophils Absolute: 0 10*3/uL (ref 0.0–0.1)
Basophils Relative: 0 %
Eosinophils Absolute: 0 10*3/uL (ref 0.0–0.5)
Eosinophils Relative: 0 %
HCT: 35.4 % — ABNORMAL LOW (ref 36.0–46.0)
Hemoglobin: 11.3 g/dL — ABNORMAL LOW (ref 12.0–15.0)
Immature Granulocytes: 0 %
Lymphocytes Relative: 25 %
Lymphs Abs: 1.2 10*3/uL (ref 0.7–4.0)
MCH: 33 pg (ref 26.0–34.0)
MCHC: 31.9 g/dL (ref 30.0–36.0)
MCV: 103.5 fL — ABNORMAL HIGH (ref 80.0–100.0)
Monocytes Absolute: 0.7 10*3/uL (ref 0.1–1.0)
Monocytes Relative: 15 %
Neutro Abs: 2.9 10*3/uL (ref 1.7–7.7)
Neutrophils Relative %: 60 %
Platelet Count: 106 10*3/uL — ABNORMAL LOW (ref 150–400)
RBC: 3.42 MIL/uL — ABNORMAL LOW (ref 3.87–5.11)
RDW: 12.1 % (ref 11.5–15.5)
WBC Count: 4.9 10*3/uL (ref 4.0–10.5)
nRBC: 0 % (ref 0.0–0.2)

## 2021-09-23 LAB — CMP (CANCER CENTER ONLY)
ALT: 8 U/L (ref 0–44)
AST: 9 U/L — ABNORMAL LOW (ref 15–41)
Albumin: 3.6 g/dL (ref 3.5–5.0)
Alkaline Phosphatase: 59 U/L (ref 38–126)
Anion gap: 4 — ABNORMAL LOW (ref 5–15)
BUN: 25 mg/dL — ABNORMAL HIGH (ref 8–23)
CO2: 28 mmol/L (ref 22–32)
Calcium: 8.8 mg/dL — ABNORMAL LOW (ref 8.9–10.3)
Chloride: 107 mmol/L (ref 98–111)
Creatinine: 0.74 mg/dL (ref 0.44–1.00)
GFR, Estimated: >60 mL/min (ref >60)
Glucose, Bld: 92 mg/dL (ref 70–99)
Potassium: 4.3 mmol/L (ref 3.5–5.1)
Sodium: 139 mmol/L (ref 135–145)
Total Bilirubin: 0.3 mg/dL (ref 0.3–1.2)
Total Protein: 6.1 g/dL — ABNORMAL LOW (ref 6.5–8.1)

## 2021-09-23 MED ORDER — DEXTROSE 5 % IV SOLN
35.0000 mg/m2 | Freq: Once | INTRAVENOUS | Status: AC
  Administered 2021-09-23: 60 mg via INTRAVENOUS
  Filled 2021-09-23: qty 30

## 2021-09-23 MED ORDER — SODIUM CHLORIDE 0.9% FLUSH
10.0000 mL | INTRAVENOUS | Status: DC | PRN
  Administered 2021-09-23: 10 mL

## 2021-09-23 MED ORDER — SODIUM CHLORIDE 0.9 % IV SOLN: Freq: Once | INTRAVENOUS | Status: AC

## 2021-09-23 MED ORDER — HEPARIN SOD (PORK) LOCK FLUSH 100 UNIT/ML IV SOLN
500.0000 [IU] | Freq: Once | INTRAVENOUS | Status: AC | PRN
  Administered 2021-09-23: 500 [IU]

## 2021-09-23 MED ORDER — SODIUM CHLORIDE 0.9% FLUSH
10.0000 mL | Freq: Once | INTRAVENOUS | Status: AC
  Administered 2021-09-23: 10 mL

## 2021-09-23 MED ORDER — SODIUM CHLORIDE 0.9 % IV SOLN
20.0000 mg | Freq: Once | INTRAVENOUS | Status: AC
  Administered 2021-09-23: 20 mg via INTRAVENOUS
  Filled 2021-09-23: qty 20

## 2021-09-23 NOTE — Patient Instructions (Signed)
[facility]  Discharge Instructions: ?Thank you for choosing [facility] to provide your oncology and hematology care.  ? ?If you have a lab appointment with the [facility], please go directly to the [facility] and check in at the registration area. ?  ?Wear comfortable clothing and clothing appropriate for easy access to any Portacath or PICC line.  ? ?We strive to give you quality time with your provider. You may need to reschedule your appointment if you arrive late (15 or more minutes).  Arriving late affects you and other patients whose appointments are after yours.  Also, if you miss three or more appointments without notifying the office, you may be dismissed from the clinic at the provider?s discretion.    ?  ?For prescription refill requests, have your pharmacy contact our office and allow 72 hours for refills to be completed.   ? ?Today you received the following chemotherapy and/or immunotherapy agent: Carfilzomib (Kyprolis) ?  ?To help prevent nausea and vomiting after your treatment, we encourage you to take your nausea medication as directed. ? ?BELOW ARE SYMPTOMS THAT SHOULD BE REPORTED IMMEDIATELY: ?*FEVER GREATER THAN 100.4 F (38 ?C) OR HIGHER ?*CHILLS OR SWEATING ?*NAUSEA AND VOMITING THAT IS NOT CONTROLLED WITH YOUR NAUSEA MEDICATION ?*UNUSUAL SHORTNESS OF BREATH ?*UNUSUAL BRUISING OR BLEEDING ?*URINARY PROBLEMS (pain or burning when urinating, or frequent urination) ?*BOWEL PROBLEMS (unusual diarrhea, constipation, pain near the anus) ?TENDERNESS IN MOUTH AND THROAT WITH OR WITHOUT PRESENCE OF ULCERS (sore throat, sores in mouth, or a toothache) ?UNUSUAL RASH, SWELLING OR PAIN  ?UNUSUAL VAGINAL DISCHARGE OR ITCHING  ? ?Items with * indicate a potential emergency and should be followed up as soon as possible or go to the Emergency Department if any problems should occur. ? ?Please show the CHEMOTHERAPY ALERT CARD or IMMUNOTHERAPY ALERT CARD at  check-in to the Emergency Department and triage nurse. ? ?Should you have questions after your visit or need to cancel or reschedule your appointment, please contact [facility]  Dept: [phone]  and follow the prompts.  Office hours are 8:00 a.m. to 4:30 p.m. Monday - Friday. Please note that voicemails left after 4:00 p.m. may not be returned until the following business day.  We are closed weekends and major holidays. You have access to a nurse at all times for urgent questions. Please call the main number to the clinic Dept: [phone] and follow the prompts. ? ? ?For any non-urgent questions, you may also contact your provider using MyChart. We now offer e-Visits for anyone 41 and older to request care online for non-urgent symptoms. For details visit [email or web address]. ?  ?Also download the MyChart app! Go to the app store, search "MyChart", open the app, select [facility], and log in with your MyChart username and password. ? ?Due to Covid, a mask is required upon entering the hospital/clinic. If you do not have a mask, one will be given to you upon arrival. For doctor visits, patients may have 1 support Tsutomu Barfoot aged 33 or older with them. For treatment visits, patients cannot have anyone with them due to current Covid guidelines and our immunocompromised population.  ? ?Zoledronic Acid Injection (Hypercalcemia, Oncology) ?What is this medication? ?ZOLEDRONIC ACID (ZOE le dron ik AS id) slows calcium loss from bones. It high calcium levels in the blood from some kinds of cancer. It may be used in other people at risk for bone loss. ?This medicine may be used  for other purposes; ask your health care provider or pharmacist if you have questions. ?COMMON BRAND NAME(S): Zometa ?What should I tell my care team before I take this medication? ?They need to know if you have any of these conditions: ?cancer ?dehydration ?dental disease ?kidney disease ?liver disease ?low levels  of calcium in the blood ?lung or breathing disease (asthma) ?receiving steroids like dexamethasone or prednisone ?an unusual or allergic reaction to zoledronic acid, other medicines, foods, dyes, or preservatives ?pregnant or trying to get pregnant ?breast-feeding ?How should I use this medication? ?This drug is injected into a vein. It is given by a health care provider in a hospital or clinic setting. ?Talk to your health care provider about the use of this drug in children. Special care may be needed. ?Overdosage: If you think you have taken too much of this medicine contact a poison control center or emergency room at once. ?NOTE: This medicine is only for you. Do not share this medicine with others. ?What if I miss a dose? ?Keep appointments for follow-up doses. It is important not to miss your dose. Call your health care provider if you are unable to keep an appointment. ?What may interact with this medication? ?certain antibiotics given by injection ?NSAIDs, medicines for pain and inflammation, like ibuprofen or naproxen ?some diuretics like bumetanide, furosemide ?teriparatide ?thalidomide ?This list may not describe all possible interactions. Give your health care provider a list of all the medicines, herbs, non-prescription drugs, or dietary supplements you use. Also tell them if you smoke, drink alcohol, or use illegal drugs. Some items may interact with your medicine. ?What should I watch for while using this medication? ?Visit your health care provider for regular checks on your progress. It may be some time before you see the benefit from this drug. ?Some people who take this drug have severe bone, joint, or muscle pain. This drug may also increase your risk for jaw problems or a broken thigh bone. Tell your health care provider right away if you have severe pain in your jaw, bones, joints, or muscles. Tell you health care provider if you have any pain that does not go away or that gets worse. ?Tell  your dentist and dental surgeon that you are taking this drug. You should not have major dental surgery while on this drug. See your dentist to have a dental exam and fix any dental problems before starting this drug. Take good care of your teeth while on this drug. Make sure you see your dentist for regular follow-up appointments. ?You should make sure you get enough calcium and vitamin D while you are taking this drug. Discuss the foods you eat and the vitamins you take with your health care provider. ?Check with your health care provider if you have severe diarrhea, nausea, and vomiting, or if you sweat a lot. The loss of too much body fluid may make it dangerous for you to take this drug. ?You may need blood work done while you are taking this drug. ?Do not become pregnant while taking this drug. Women should inform their health care provider if they wish to become pregnant or think they might be pregnant. There is potential for serious harm to an unborn child. Talk to your health care provider for more information. ?What side effects may I notice from receiving this medication? ?Side effects that you should report to your doctor or health care provider as soon as possible: ?allergic reactions (skin rash, itching or hives; swelling of  the face, lips, or tongue) ?bone pain ?infection (fever, chills, cough, sore throat, pain or trouble passing urine) ?jaw pain, especially after dental work ?joint pain ?kidney injury (trouble passing urine or change in the amount of urine) ?low blood pressure (dizziness; feeling faint or lightheaded, falls; unusually weak or tired) ?low calcium levels (fast heartbeat; muscle cramps or pain; pain, tingling, or numbness in the hands or feet; seizures) ?low magnesium levels (fast, irregular heartbeat; muscle cramp or pain; muscle weakness; tremors; seizures) ?low red blood cell counts (trouble breathing; feeling faint; lightheaded, falls; unusually weak or tired) ?muscle  pain ?redness, blistering, peeling, or loosening of the skin, including inside the mouth ?severe diarrhea ?swelling of the ankles, feet, hands ?trouble breathing ?Side effects that usually do not require medical atten

## 2021-09-30 ENCOUNTER — Inpatient Hospital Stay: Payer: Medicare HMO

## 2021-09-30 ENCOUNTER — Inpatient Hospital Stay: Payer: Medicare HMO | Attending: Hematology and Oncology

## 2021-09-30 ENCOUNTER — Other Ambulatory Visit: Payer: Self-pay

## 2021-09-30 VITALS — BP 134/51 | HR 59 | Temp 97.9°F | Resp 18 | Ht 62.5 in | Wt 167.8 lb

## 2021-09-30 DIAGNOSIS — C9002 Multiple myeloma in relapse: Secondary | ICD-10-CM

## 2021-09-30 DIAGNOSIS — Z5111 Encounter for antineoplastic chemotherapy: Secondary | ICD-10-CM | POA: Insufficient documentation

## 2021-09-30 DIAGNOSIS — D61818 Other pancytopenia: Secondary | ICD-10-CM | POA: Diagnosis not present

## 2021-09-30 DIAGNOSIS — D469 Myelodysplastic syndrome, unspecified: Secondary | ICD-10-CM

## 2021-09-30 LAB — CBC WITH DIFFERENTIAL (CANCER CENTER ONLY)
Abs Immature Granulocytes: 0.01 10*3/uL (ref 0.00–0.07)
Basophils Absolute: 0 10*3/uL (ref 0.0–0.1)
Basophils Relative: 0 %
Eosinophils Absolute: 0 10*3/uL (ref 0.0–0.5)
Eosinophils Relative: 0 %
HCT: 34.6 % — ABNORMAL LOW (ref 36.0–46.0)
Hemoglobin: 11.3 g/dL — ABNORMAL LOW (ref 12.0–15.0)
Immature Granulocytes: 0 %
Lymphocytes Relative: 20 %
Lymphs Abs: 1.3 10*3/uL (ref 0.7–4.0)
MCH: 34 pg (ref 26.0–34.0)
MCHC: 32.7 g/dL (ref 30.0–36.0)
MCV: 104.2 fL — ABNORMAL HIGH (ref 80.0–100.0)
Monocytes Absolute: 0.7 10*3/uL (ref 0.1–1.0)
Monocytes Relative: 11 %
Neutro Abs: 4.3 10*3/uL (ref 1.7–7.7)
Neutrophils Relative %: 69 %
Platelet Count: 113 10*3/uL — ABNORMAL LOW (ref 150–400)
RBC: 3.32 MIL/uL — ABNORMAL LOW (ref 3.87–5.11)
RDW: 12.1 % (ref 11.5–15.5)
WBC Count: 6.3 10*3/uL (ref 4.0–10.5)
nRBC: 0 % (ref 0.0–0.2)

## 2021-09-30 LAB — CMP (CANCER CENTER ONLY)
ALT: 8 U/L (ref 0–44)
AST: 10 U/L — ABNORMAL LOW (ref 15–41)
Albumin: 3.6 g/dL (ref 3.5–5.0)
Alkaline Phosphatase: 57 U/L (ref 38–126)
Anion gap: 5 (ref 5–15)
BUN: 22 mg/dL (ref 8–23)
CO2: 28 mmol/L (ref 22–32)
Calcium: 8.4 mg/dL — ABNORMAL LOW (ref 8.9–10.3)
Chloride: 108 mmol/L (ref 98–111)
Creatinine: 0.75 mg/dL (ref 0.44–1.00)
GFR, Estimated: >60 mL/min (ref >60)
Glucose, Bld: 99 mg/dL (ref 70–99)
Potassium: 4.3 mmol/L (ref 3.5–5.1)
Sodium: 141 mmol/L (ref 135–145)
Total Bilirubin: 0.2 mg/dL — ABNORMAL LOW (ref 0.3–1.2)
Total Protein: 6 g/dL — ABNORMAL LOW (ref 6.5–8.1)

## 2021-09-30 MED ORDER — HEPARIN SOD (PORK) LOCK FLUSH 100 UNIT/ML IV SOLN
500.0000 [IU] | Freq: Once | INTRAVENOUS | Status: AC | PRN
  Administered 2021-09-30: 500 [IU]

## 2021-09-30 MED ORDER — DEXTROSE 5 % IV SOLN
35.0000 mg/m2 | Freq: Once | INTRAVENOUS | Status: AC
  Administered 2021-09-30: 60 mg via INTRAVENOUS
  Filled 2021-09-30: qty 30

## 2021-09-30 MED ORDER — SODIUM CHLORIDE 0.9% FLUSH
10.0000 mL | INTRAVENOUS | Status: DC | PRN
  Administered 2021-09-30: 10 mL

## 2021-09-30 MED ORDER — SODIUM CHLORIDE 0.9% FLUSH
10.0000 mL | Freq: Once | INTRAVENOUS | Status: AC
  Administered 2021-09-30: 10 mL

## 2021-09-30 MED ORDER — SODIUM CHLORIDE 0.9 % IV SOLN
20.0000 mg | Freq: Once | INTRAVENOUS | Status: AC
  Administered 2021-09-30: 20 mg via INTRAVENOUS
  Filled 2021-09-30: qty 20

## 2021-09-30 MED ORDER — SODIUM CHLORIDE 0.9 % IV SOLN: Freq: Once | INTRAVENOUS | Status: AC

## 2021-09-30 NOTE — Patient Instructions (Addendum)
[facility]  Discharge Instructions: ?Thank you for choosing [facility] to provide your oncology and hematology care.  ? ?If you have a lab appointment with the [facility], please go directly to the [facility] and check in at the registration area. ?  ?Wear comfortable clothing and clothing appropriate for easy access to any Portacath or PICC line.  ? ?We strive to give you quality time with your provider. You may need to reschedule your appointment if you arrive late (15 or more minutes).  Arriving late affects you and other patients whose appointments are after yours.  Also, if you miss three or more appointments without notifying the office, you may be dismissed from the clinic at the provider?s discretion.    ?  ?For prescription refill requests, have your pharmacy contact our office and allow 72 hours for refills to be completed.   ? ?Today you received the following chemotherapy and/or immunotherapy agent: Kyprolis    ?  ?To help prevent nausea and vomiting after your treatment, we encourage you to take your nausea medication as directed. ? ?BELOW ARE SYMPTOMS THAT SHOULD BE REPORTED IMMEDIATELY: ?*FEVER GREATER THAN 100.4 F (38 ?C) OR HIGHER ?*CHILLS OR SWEATING ?*NAUSEA AND VOMITING THAT IS NOT CONTROLLED WITH YOUR NAUSEA MEDICATION ?*UNUSUAL SHORTNESS OF BREATH ?*UNUSUAL BRUISING OR BLEEDING ?*URINARY PROBLEMS (pain or burning when urinating, or frequent urination) ?*BOWEL PROBLEMS (unusual diarrhea, constipation, pain near the anus) ?TENDERNESS IN MOUTH AND THROAT WITH OR WITHOUT PRESENCE OF ULCERS (sore throat, sores in mouth, or a toothache) ?UNUSUAL RASH, SWELLING OR PAIN  ?UNUSUAL VAGINAL DISCHARGE OR ITCHING  ? ?Items with * indicate a potential emergency and should be followed up as soon as possible or go to the Emergency Department if any problems should occur. ? ?Please show the CHEMOTHERAPY ALERT CARD or IMMUNOTHERAPY ALERT CARD at check-in to  the Emergency Department and triage nurse. ? ?Should you have questions after your visit or need to cancel or reschedule your appointment, please contact [facility]  Dept: [phone]  and follow the prompts.  Office hours are 8:00 a.m. to 4:30 p.m. Monday - Friday. Please note that voicemails left after 4:00 p.m. may not be returned until the following business day.  We are closed weekends and major holidays. You have access to a nurse at all times for urgent questions. Please call the main number to the clinic Dept: [phone] and follow the prompts. ? ? ?For any non-urgent questions, you may also contact your provider using MyChart. We now offer e-Visits for anyone 71 and older to request care online for non-urgent symptoms. For details visit [email or web address]. ?  ?Also download the MyChart app! Go to the app store, search "MyChart", open the app, select [facility], and log in with your MyChart username and password. ? ?Due to Covid, a mask is required upon entering the hospital/clinic. If you do not have a mask, one will be given to you upon arrival. For doctor visits, patients may have 1 support person aged 15 or older with them. For treatment visits, patients cannot have anyone with them due to current Covid guidelines and our immunocompromised population.  ? ?

## 2021-10-12 MED FILL — Dexamethasone Sodium Phosphate Inj 100 MG/10ML: INTRAMUSCULAR | Qty: 2 | Status: AC

## 2021-10-13 ENCOUNTER — Inpatient Hospital Stay: Payer: Medicare HMO | Admitting: Hematology and Oncology

## 2021-10-13 ENCOUNTER — Encounter: Payer: Self-pay | Admitting: Hematology and Oncology

## 2021-10-13 ENCOUNTER — Inpatient Hospital Stay: Payer: Medicare HMO

## 2021-10-13 ENCOUNTER — Other Ambulatory Visit: Payer: Self-pay

## 2021-10-13 DIAGNOSIS — D469 Myelodysplastic syndrome, unspecified: Secondary | ICD-10-CM

## 2021-10-13 DIAGNOSIS — C9002 Multiple myeloma in relapse: Secondary | ICD-10-CM

## 2021-10-13 DIAGNOSIS — D61818 Other pancytopenia: Secondary | ICD-10-CM

## 2021-10-13 DIAGNOSIS — R635 Abnormal weight gain: Secondary | ICD-10-CM | POA: Diagnosis not present

## 2021-10-13 DIAGNOSIS — Z5111 Encounter for antineoplastic chemotherapy: Secondary | ICD-10-CM | POA: Diagnosis not present

## 2021-10-13 LAB — CMP (CANCER CENTER ONLY)
ALT: 9 U/L (ref 0–44)
AST: 13 U/L — ABNORMAL LOW (ref 15–41)
Albumin: 3.7 g/dL (ref 3.5–5.0)
Alkaline Phosphatase: 63 U/L (ref 38–126)
Anion gap: 6 (ref 5–15)
BUN: 23 mg/dL (ref 8–23)
CO2: 28 mmol/L (ref 22–32)
Calcium: 9 mg/dL (ref 8.9–10.3)
Chloride: 106 mmol/L (ref 98–111)
Creatinine: 0.82 mg/dL (ref 0.44–1.00)
GFR, Estimated: >60 mL/min (ref >60)
Glucose, Bld: 130 mg/dL — ABNORMAL HIGH (ref 70–99)
Potassium: 3.9 mmol/L (ref 3.5–5.1)
Sodium: 140 mmol/L (ref 135–145)
Total Bilirubin: 0.3 mg/dL (ref 0.3–1.2)
Total Protein: 6.3 g/dL — ABNORMAL LOW (ref 6.5–8.1)

## 2021-10-13 LAB — CBC WITH DIFFERENTIAL (CANCER CENTER ONLY)
Abs Immature Granulocytes: 0.02 10*3/uL (ref 0.00–0.07)
Basophils Absolute: 0 10*3/uL (ref 0.0–0.1)
Basophils Relative: 0 %
Eosinophils Absolute: 0 10*3/uL (ref 0.0–0.5)
Eosinophils Relative: 0 %
HCT: 36.2 % (ref 36.0–46.0)
Hemoglobin: 11.9 g/dL — ABNORMAL LOW (ref 12.0–15.0)
Immature Granulocytes: 0 %
Lymphocytes Relative: 18 %
Lymphs Abs: 0.9 10*3/uL (ref 0.7–4.0)
MCH: 33.3 pg (ref 26.0–34.0)
MCHC: 32.9 g/dL (ref 30.0–36.0)
MCV: 101.4 fL — ABNORMAL HIGH (ref 80.0–100.0)
Monocytes Absolute: 0.5 10*3/uL (ref 0.1–1.0)
Monocytes Relative: 10 %
Neutro Abs: 3.4 10*3/uL (ref 1.7–7.7)
Neutrophils Relative %: 72 %
Platelet Count: 129 10*3/uL — ABNORMAL LOW (ref 150–400)
RBC: 3.57 MIL/uL — ABNORMAL LOW (ref 3.87–5.11)
RDW: 12 % (ref 11.5–15.5)
WBC Count: 4.7 10*3/uL (ref 4.0–10.5)
nRBC: 0 % (ref 0.0–0.2)

## 2021-10-13 MED ORDER — SODIUM CHLORIDE 0.9% FLUSH
10.0000 mL | Freq: Once | INTRAVENOUS | Status: AC
  Administered 2021-10-13: 10 mL

## 2021-10-13 MED ORDER — SODIUM CHLORIDE 0.9% FLUSH
10.0000 mL | INTRAVENOUS | Status: DC | PRN
  Administered 2021-10-13: 10 mL

## 2021-10-13 MED ORDER — SODIUM CHLORIDE 0.9 % IV SOLN: Freq: Once | INTRAVENOUS | Status: AC

## 2021-10-13 MED ORDER — DEXTROSE 5 % IV SOLN
60.0000 mg | Freq: Once | INTRAVENOUS | Status: AC
  Administered 2021-10-13: 60 mg via INTRAVENOUS
  Filled 2021-10-13: qty 30

## 2021-10-13 MED ORDER — HEPARIN SOD (PORK) LOCK FLUSH 100 UNIT/ML IV SOLN
500.0000 [IU] | Freq: Once | INTRAVENOUS | Status: AC | PRN
  Administered 2021-10-13: 500 [IU]

## 2021-10-13 MED ORDER — MIRTAZAPINE 7.5 MG PO TABS: 7.5000 mg | ORAL_TABLET | Freq: Every day | ORAL | 1 refills | Status: DC

## 2021-10-13 NOTE — Assessment & Plan Note (Signed)
Overall, she has positive response to treatment ?So far, she tolerated 36 mg per metered square of kyprolis ?She has mild pancytopenia but overall asymptomatic ?The patient would like to come off dexamethasone weekly due to progressive weight gain ?I think it is reasonable ?However, if her light chains start to trend up, we might have to add dexamethasone back or increase the dose of Kyprolis ? ?

## 2021-10-13 NOTE — Patient Instructions (Signed)
[facility]  Discharge Instructions: Thank you for choosing [facility] to provide your oncology and hematology care.   If you have a lab appointment with the Cancer Center, please go directly to the Cancer Center and check in at the registration area.   Wear comfortable clothing and clothing appropriate for easy access to any Portacath or PICC line.   We strive to give you quality time with your provider. You may need to reschedule your appointment if you arrive late (15 or more minutes).  Arriving late affects you and other patients whose appointments are after yours.  Also, if you miss three or more appointments without notifying the office, you may be dismissed from the clinic at the provider's discretion.      For prescription refill requests, have your pharmacy contact our office and allow 72 hours for refills to be completed.    Today you received the following chemotherapy and/or immunotherapy agents: Kyprolis    To help prevent nausea and vomiting after your treatment, we encourage you to take your nausea medication as directed.  BELOW ARE SYMPTOMS THAT SHOULD BE REPORTED IMMEDIATELY: . *FEVER GREATER THAN 100.4 F (38 C) OR HIGHER . *CHILLS OR SWEATING . *NAUSEA AND VOMITING THAT IS NOT CONTROLLED WITH YOUR NAUSEA MEDICATION . *UNUSUAL SHORTNESS OF BREATH . *UNUSUAL BRUISING OR BLEEDING . *URINARY PROBLEMS (pain or burning when urinating, or frequent urination) . *BOWEL PROBLEMS (unusual diarrhea, constipation, pain near the anus) . TENDERNESS IN MOUTH AND THROAT WITH OR WITHOUT PRESENCE OF ULCERS (sore throat, sores in mouth, or a toothache) . UNUSUAL RASH, SWELLING OR PAIN  . UNUSUAL VAGINAL DISCHARGE OR ITCHING   Items with * indicate a potential emergency and should be followed up as soon as possible or go to the Emergency Department if any problems should occur.  Please show the CHEMOTHERAPY ALERT CARD or IMMUNOTHERAPY ALERT  CARD at check-in to the Emergency Department and triage nurse.  Should you have questions after your visit or need to cancel or reschedule your appointment, please contact [facility]  Dept: [phone]  and follow the prompts.  Office hours are 8:00 a.m. to 4:30 p.m. Monday - Friday. Please note that voicemails left after 4:00 p.m. may not be returned until the following business day.  We are closed weekends and major holidays. You have access to a nurse at all times for urgent questions. Please call the main number to the clinic Dept: [phone] and follow the prompts.   For any non-urgent questions, you may also contact your provider using MyChart. We now offer e-Visits for anyone 18 and older to request care online for non-urgent symptoms. For details visit [email or web address].   Also download the MyChart app! Go to the app store, search "MyChart", open the app, select [facility], and log in with your MyChart username and password.  Due to Covid, a mask is required upon entering the hospital/clinic. If you do not have a mask, one will be given to you upon arrival. For doctor visits, patients may have 1 support person aged 18 or older with them. For treatment visits, patients cannot have anyone with them due to current Covid guidelines and our immunocompromised population.   

## 2021-10-13 NOTE — Assessment & Plan Note (Signed)
The cause of her pancytopenia is multifactorial, likely due to treatment ?She is not symptomatic ?Observe only ?

## 2021-10-13 NOTE — Progress Notes (Signed)
[facility] ?OFFICE PROGRESS NOTE ? ?Patient Care Team: ?[name], [name], MD as PCP - General (Internal Medicine) ?[name], MD as PCP - Cardiology (Cardiology) ?[name], MD as Referring Physician (Hematology and Oncology) ?[name], MD as Consulting Physician (Internal Medicine) ?[name], [name], MD as Consulting Physician (Infectious Diseases) ?[name], MD as Consulting Physician (Hematology and Oncology) ?[name], NP as Nurse Practitioner (Hematology and Oncology) ?[name], MD as Consulting Physician (Hematology and Oncology) ? ?ASSESSMENT & PLAN:  ?Multiple myeloma in relapse ([place] [facility]) ?Overall, she has positive response to treatment ?So far, she tolerated 36 mg per metered square of kyprolis ?She has mild pancytopenia but overall asymptomatic ?The patient would like to come off dexamethasone weekly due to progressive weight gain ?I think it is reasonable ?However, if her light chains start to trend up, we might have to add dexamethasone back or increase the dose of Kyprolis ? ? ?Pancytopenia, acquired ([place]) ?The cause of her pancytopenia is multifactorial, likely due to treatment ?She is not symptomatic ?Observe only ? ?Weight gain ?This is likely due to side effects of dexamethasone ?I plan to discontinue that ?I also recommend reducing the dose of Remeron that could cause weight gain ?She is in agreement ?I also give her reading resources for lifestyle changes ? ?Orders Placed This Encounter  ?Procedures  ? CBC with Differential/Platelet  ?  Standing Status:   Standing  ?  Number of Occurrences:   14  ?  Standing Expiration Date:   10/14/2022  ? Comprehensive metabolic panel  ?  Standing Status:   Standing  ?  Number of Occurrences:   86  ?  Standing Expiration Date:   10/14/2022  ? ? ?All questions were answered. The patient knows to call the clinic with any problems, questions or concerns. ?The total time spent in the  appointment was 25 minutes encounter with patients including review of chart and various tests results, discussions about plan of care and coordination of care plan ?  ?[name], MD ?10/13/2021 9:04 AM ? ?INTERVAL HISTORY: ?Please see below for problem oriented charting. ?she returns for treatment follow-up on maintenance treatment with Kyprolis ?She is doing well ?No recent infection ?No new bone pain ?Her weight is stable ? ?REVIEW OF SYSTEMS:   ?Constitutional: Denies fevers, chills or abnormal weight loss ?Eyes: Denies blurriness of vision ?Ears, nose, mouth, throat, and face: Denies mucositis or sore throat ?Respiratory: Denies cough, dyspnea or wheezes ?Cardiovascular: Denies palpitation, chest discomfort or lower extremity swelling ?Gastrointestinal:  Denies nausea, heartburn or change in bowel habits ?Skin: Denies abnormal skin rashes ?Lymphatics: Denies new lymphadenopathy or easy bruising ?Neurological:Denies numbness, tingling or new weaknesses ?Behavioral/Psych: Mood is stable, no new changes  ?All other systems were reviewed with the patient and are negative. ? ?I have reviewed the past medical history, past surgical history, social history and family history with the patient and they are unchanged from previous note. ? ?ALLERGIES:  has No Known Allergies. ? ?MEDICATIONS:  ?Current Outpatient Medications  ?Medication Sig Dispense Refill  ? acyclovir (ZOVIRAX) 400 MG tablet TAKE 1 TABLET BY MOUTH TWICE A DAY 180 tablet 11  ? aspirin 325 MG tablet Take 325 mg by mouth daily.    ? calcium carbonate (TUMS - DOSED IN MG ELEMENTAL CALCIUM) 500 MG chewable tablet Chew 1 tablet by mouth daily.    ? Carfilzomib (KYPROLIS IV) Inject into the vein once a week.    ?  Cholecalciferol 25 MCG (1000 UT) tablet Take 1,000 Units by mouth daily.    ? Cyanocobalamin (VITAMIN B12 PO) Take 1 tablet by mouth daily.    ? loperamide (IMODIUM) 2 MG capsule Take by mouth as needed for diarrhea or loose stools. As needed only    ?  metoprolol tartrate (LOPRESSOR) 25 MG tablet TAKE 1.5 TABLETS (37.5 MG TOTAL) BY MOUTH 2 (TWO) TIMES DAILY. 270 tablet 1  ? mirtazapine (REMERON) 7.5 MG tablet Take 1 tablet (7.5 mg total) by mouth at bedtime. 90 tablet 1  ? Multiple Vitamin (MULTIVITAMIN WITH MINERALS) TABS tablet Take 1 tablet by mouth daily.    ? Multiple Vitamins-Minerals (PRESERVISION AREDS 2 PO) Take 1 tablet by mouth 2 (two) times daily.    ? ondansetron (ZOFRAN) 8 MG tablet Take 1 tablet (8 mg total) by mouth every 8 (eight) hours as needed (Nausea or vomiting). 30 tablet 1  ? pantoprazole (PROTONIX) 40 MG tablet TAKE 1 TABLET BY MOUTH 2 TIMES DAILY. TAKE 30-60 MINUTES BEFORE BREAKFAST AND DINNER. 180 tablet 2  ? prochlorperazine (COMPAZINE) 10 MG tablet Take 1 tablet (10 mg total) by mouth every 6 (six) hours as needed (Nausea or vomiting). 30 tablet 1  ? ?No current facility-administered medications for this visit.  ? ?Facility-Administered Medications Ordered in Other Visits  ?Medication Dose Route Frequency Provider Last Rate Last Admin  ? 0.9 %  sodium chloride infusion   Intravenous Once [name], MD      ? carfilzomib (KYPROLIS) 66 mg in dextrose 5 % 100 mL chemo infusion  36 mg/m2 (Treatment Plan Recorded) Intravenous Once Alvy Bimler, Reann Dobias, MD      ? heparin lock flush 100 unit/mL  500 Units Intracatheter Once PRN Alvy Bimler, Bobie Kistler, MD      ? sodium chloride flush (NS) 0.9 % injection 10 mL  10 mL Intracatheter PRN [name], MD      ? ? ?SUMMARY OF ONCOLOGIC HISTORY: ?Oncology History Overview Note  ?Multiple myeloma, Ig A Lambda, M spike 3.54 grams, Calcium 9.2, Creatinine 0.8, Beta 2 microglobulin 4.52, IgA 4840 mg/dL, lambda light chain 75.4, albumin 3.6, hemoglobin 9.7, platelet 115 ? ?  Primary site: Multiple Myeloma ?  Staging method: AJCC 6th Edition ?  Clinical: Stage IIA signed by [name], MD on 11/07/2013  2:46 PM ?  Summary: Stage IIA ?S/p Allo transplant at [facility] ?Progressed on revlimid, pomalyst, daratumumab and  velcade ? ? ?  ?Multiple myeloma in relapse [facility])  ?10/31/2013 Bone Marrow Biopsy  ? Bone marrow biopsy confirmed multiple myeloma with 40% bone marrow involvement. Skeletal survey showed minimal lesions in her score with generalized demineralization ? ?  ?11/10/2013 - 02/13/2014 Chemotherapy  ? The patient is started on induction chemotherapy with weekly dexamethasone 40 mg by mouth as well as Velcade subcutaneous injection on days 1, 4, 8 and 11. On 11/21/2013, she was started on monthly Zometa. ? ?  ?12/23/2013 Adverse Reaction  ? The dose of Velcade was reduced due to thrombocytopenia. ? ?  ?01/28/2014 - 04/07/2014 Chemotherapy  ? Revlimid is added. Treatment was discontinued due to lack of response. ? ?  ?02/24/2014 - 04/07/2014 Chemotherapy  ? Due to worsening peripheral neuropathy, Velcade injection is changed to once a week. Revlimid was given 21 days on, 7 days off. ? ?  ?04/07/2014 - 04/10/2014 Chemotherapy  ? Revlimid was discontinued due to lack of response. Chemotherapy was changed back to Velcade injection twice a week, 2 weeks on 1 week off. Her  treatment was switched to to minimum response ? ?  ?04/20/2014 - 06/02/2014 Chemotherapy  ? chemotherapy is switched to Carfilzomib, Cytoxan and dexamethasone. ? ?  ?04/22/2014 Procedure  ? she has placement of port for chemotherapy. ? ?  ?06/01/2014 Tumor Marker  ? Bloodwork show that she has greater than partial response ? ?  ?06/23/2014 Bone Marrow Biopsy  ? Bone marrow biopsy show 5-10% residual plasma cells, normal cytogenetics and FISH ? ?  ?07/07/2014 Procedure  ? She had stem cell collection ? ?  ?07/22/2014 - 07/22/2014 Chemotherapy  ? She had high-dose chemotherapy with melphalan ? ?  ?07/23/2014 Bone Marrow Transplant  ? 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 ?  ?10/20/2014 - 03/24/2015 Chemotherapy  ?  she received chemotherapy with Kyprolis, Revlimid and dexamethasone ? ?  ?10/22/2014 Procedure  ? She has port placement ? ?  ?01/19/2015 Tumor  Marker  ? IgA lambda M spike at 0.4 g  ? ?  ?01/20/2015 Miscellaneous  ? 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 ?  ?02/02/2015 Miscellaneous  ? She received GCSF for severe neutropenia ? ?  ?02/26/2015 Bone [place]

## 2021-10-13 NOTE — Assessment & Plan Note (Signed)
This is likely due to side effects of dexamethasone ?I plan to discontinue that ?I also recommend reducing the dose of Remeron that could cause weight gain ?She is in agreement ?I also give her reading resources for lifestyle changes ?

## 2021-10-14 LAB — KAPPA/LAMBDA LIGHT CHAINS
Kappa free light chain: 7.3 mg/L (ref 3.3–19.4)
Kappa, lambda light chain ratio: 0.1 — ABNORMAL LOW (ref 0.26–1.65)
Lambda free light chains: 70.7 mg/L — ABNORMAL HIGH (ref 5.7–26.3)

## 2021-10-17 ENCOUNTER — Telehealth: Payer: Self-pay | Admitting: Hematology and Oncology

## 2021-10-17 LAB — MULTIPLE MYELOMA PANEL, SERUM
Albumin SerPl Elph-Mcnc: 3.5 g/dL (ref 2.9–4.4)
Albumin/Glob SerPl: 1.5 (ref 0.7–1.7)
Alpha 1: 0.2 g/dL (ref 0.0–0.4)
Alpha2 Glob SerPl Elph-Mcnc: 0.9 g/dL (ref 0.4–1.0)
B-Globulin SerPl Elph-Mcnc: 0.7 g/dL (ref 0.7–1.3)
Gamma Glob SerPl Elph-Mcnc: 0.6 g/dL (ref 0.4–1.8)
Globulin, Total: 2.4 g/dL (ref 2.2–3.9)
IgA: 222 mg/dL (ref 64–422)
IgG (Immunoglobin G), Serum: 370 mg/dL — ABNORMAL LOW (ref 586–1602)
IgM (Immunoglobulin M), Srm: 20 mg/dL — ABNORMAL LOW (ref 26–217)
M Protein SerPl Elph-Mcnc: 0.2 g/dL — ABNORMAL HIGH
Total Protein ELP: 5.9 g/dL — ABNORMAL LOW (ref 6.0–8.5)

## 2021-10-17 NOTE — Telephone Encounter (Signed)
.  Called patient to schedule appointment per 4/24 inbasket, patient is aware of date and time.   ?

## 2021-10-20 ENCOUNTER — Inpatient Hospital Stay: Payer: Medicare HMO

## 2021-10-20 ENCOUNTER — Other Ambulatory Visit: Payer: Self-pay | Admitting: Hematology and Oncology

## 2021-10-20 ENCOUNTER — Other Ambulatory Visit: Payer: Self-pay

## 2021-10-20 ENCOUNTER — Inpatient Hospital Stay: Payer: Medicare HMO | Admitting: Hematology and Oncology

## 2021-10-20 VITALS — BP 138/74 | HR 61 | Temp 98.6°F | Resp 17 | Wt 167.0 lb

## 2021-10-20 DIAGNOSIS — C9002 Multiple myeloma in relapse: Secondary | ICD-10-CM

## 2021-10-20 DIAGNOSIS — Z5111 Encounter for antineoplastic chemotherapy: Secondary | ICD-10-CM | POA: Diagnosis not present

## 2021-10-20 DIAGNOSIS — D469 Myelodysplastic syndrome, unspecified: Secondary | ICD-10-CM

## 2021-10-20 LAB — CBC WITH DIFFERENTIAL/PLATELET
Abs Immature Granulocytes: 0 10*3/uL (ref 0.00–0.07)
Basophils Absolute: 0 10*3/uL (ref 0.0–0.1)
Basophils Relative: 0 %
Eosinophils Absolute: 0 10*3/uL (ref 0.0–0.5)
Eosinophils Relative: 0 %
HCT: 34.9 % — ABNORMAL LOW (ref 36.0–46.0)
Hemoglobin: 11.4 g/dL — ABNORMAL LOW (ref 12.0–15.0)
Immature Granulocytes: 0 %
Lymphocytes Relative: 24 %
Lymphs Abs: 1.3 10*3/uL (ref 0.7–4.0)
MCH: 33 pg (ref 26.0–34.0)
MCHC: 32.7 g/dL (ref 30.0–36.0)
MCV: 101.2 fL — ABNORMAL HIGH (ref 80.0–100.0)
Monocytes Absolute: 0.7 10*3/uL (ref 0.1–1.0)
Monocytes Relative: 13 %
Neutro Abs: 3.4 10*3/uL (ref 1.7–7.7)
Neutrophils Relative %: 63 %
Platelets: 120 10*3/uL — ABNORMAL LOW (ref 150–400)
RBC: 3.45 MIL/uL — ABNORMAL LOW (ref 3.87–5.11)
RDW: 12 % (ref 11.5–15.5)
WBC: 5.4 10*3/uL (ref 4.0–10.5)
nRBC: 0 % (ref 0.0–0.2)

## 2021-10-20 LAB — COMPREHENSIVE METABOLIC PANEL
ALT: 9 U/L (ref 0–44)
AST: 12 U/L — ABNORMAL LOW (ref 15–41)
Albumin: 3.7 g/dL (ref 3.5–5.0)
Alkaline Phosphatase: 57 U/L (ref 38–126)
Anion gap: 5 (ref 5–15)
BUN: 24 mg/dL — ABNORMAL HIGH (ref 8–23)
CO2: 28 mmol/L (ref 22–32)
Calcium: 9 mg/dL (ref 8.9–10.3)
Chloride: 108 mmol/L (ref 98–111)
Creatinine, Ser: 0.68 mg/dL (ref 0.44–1.00)
GFR, Estimated: >60 mL/min (ref >60)
Glucose, Bld: 88 mg/dL (ref 70–99)
Potassium: 4.2 mmol/L (ref 3.5–5.1)
Sodium: 141 mmol/L (ref 135–145)
Total Bilirubin: 0.3 mg/dL (ref 0.3–1.2)
Total Protein: 6.1 g/dL — ABNORMAL LOW (ref 6.5–8.1)

## 2021-10-20 MED ORDER — HEPARIN SOD (PORK) LOCK FLUSH 100 UNIT/ML IV SOLN
500.0000 [IU] | Freq: Once | INTRAVENOUS | Status: AC | PRN
  Administered 2021-10-20: 500 [IU]

## 2021-10-20 MED ORDER — DEXTROSE 5 % IV SOLN
32.0000 mg/m2 | Freq: Once | INTRAVENOUS | Status: AC
  Administered 2021-10-20: 60 mg via INTRAVENOUS
  Filled 2021-10-20: qty 30

## 2021-10-20 MED ORDER — SODIUM CHLORIDE 0.9 % IV SOLN: Freq: Once | INTRAVENOUS | Status: AC

## 2021-10-20 MED ORDER — SODIUM CHLORIDE 0.9% FLUSH
10.0000 mL | Freq: Once | INTRAVENOUS | Status: AC
  Administered 2021-10-20: 10 mL

## 2021-10-20 MED ORDER — SODIUM CHLORIDE 0.9% FLUSH
10.0000 mL | INTRAVENOUS | Status: DC | PRN
  Administered 2021-10-20: 10 mL

## 2021-10-20 NOTE — Patient Instructions (Signed)
[facility]  Discharge Instructions: Thank you for choosing [facility] to provide your oncology and hematology care.   If you have a lab appointment with the Cancer Center, please go directly to the Cancer Center and check in at the registration area.   Wear comfortable clothing and clothing appropriate for easy access to any Portacath or PICC line.   We strive to give you quality time with your provider. You may need to reschedule your appointment if you arrive late (15 or more minutes).  Arriving late affects you and other patients whose appointments are after yours.  Also, if you miss three or more appointments without notifying the office, you may be dismissed from the clinic at the provider's discretion.      For prescription refill requests, have your pharmacy contact our office and allow 72 hours for refills to be completed.    Today you received the following chemotherapy and/or immunotherapy agents kyprolis      To help prevent nausea and vomiting after your treatment, we encourage you to take your nausea medication as directed.  BELOW ARE SYMPTOMS THAT SHOULD BE REPORTED IMMEDIATELY: . *FEVER GREATER THAN 100.4 F (38 C) OR HIGHER . *CHILLS OR SWEATING . *NAUSEA AND VOMITING THAT IS NOT CONTROLLED WITH YOUR NAUSEA MEDICATION . *UNUSUAL SHORTNESS OF BREATH . *UNUSUAL BRUISING OR BLEEDING . *URINARY PROBLEMS (pain or burning when urinating, or frequent urination) . *BOWEL PROBLEMS (unusual diarrhea, constipation, pain near the anus) . TENDERNESS IN MOUTH AND THROAT WITH OR WITHOUT PRESENCE OF ULCERS (sore throat, sores in mouth, or a toothache) . UNUSUAL RASH, SWELLING OR PAIN  . UNUSUAL VAGINAL DISCHARGE OR ITCHING   Items with * indicate a potential emergency and should be followed up as soon as possible or go to the Emergency Department if any problems should occur.  Please show the CHEMOTHERAPY ALERT CARD or IMMUNOTHERAPY ALERT  CARD at check-in to the Emergency Department and triage nurse.  Should you have questions after your visit or need to cancel or reschedule your appointment, please contact [facility]  Dept: [phone]  and follow the prompts.  Office hours are 8:00 a.m. to 4:30 p.m. Monday - Friday. Please note that voicemails left after 4:00 p.m. may not be returned until the following business day.  We are closed weekends and major holidays. You have access to a nurse at all times for urgent questions. Please call the main number to the clinic Dept: [phone] and follow the prompts.   For any non-urgent questions, you may also contact your provider using MyChart. We now offer e-Visits for anyone 18 and older to request care online for non-urgent symptoms. For details visit [email or web address].   Also download the MyChart app! Go to the app store, search "MyChart", open the app, select [facility], and log in with your MyChart username and password.  Due to Covid, a mask is required upon entering the hospital/clinic. If you do not have a mask, one will be given to you upon arrival. For doctor visits, patients may have 1 support person aged 18 or older with them. For treatment visits, patients cannot have anyone with them due to current Covid guidelines and our immunocompromised population.   

## 2021-10-27 ENCOUNTER — Inpatient Hospital Stay: Payer: Medicare HMO

## 2021-10-27 ENCOUNTER — Other Ambulatory Visit: Payer: Self-pay

## 2021-10-27 ENCOUNTER — Inpatient Hospital Stay: Payer: Medicare HMO | Admitting: Hematology and Oncology

## 2021-10-27 ENCOUNTER — Inpatient Hospital Stay: Payer: Medicare HMO | Attending: Hematology and Oncology

## 2021-10-27 VITALS — BP 124/81 | HR 63 | Temp 98.6°F | Resp 18 | Wt 167.8 lb

## 2021-10-27 DIAGNOSIS — C9002 Multiple myeloma in relapse: Secondary | ICD-10-CM

## 2021-10-27 DIAGNOSIS — D61818 Other pancytopenia: Secondary | ICD-10-CM | POA: Diagnosis not present

## 2021-10-27 DIAGNOSIS — Z5111 Encounter for antineoplastic chemotherapy: Secondary | ICD-10-CM | POA: Insufficient documentation

## 2021-10-27 DIAGNOSIS — D469 Myelodysplastic syndrome, unspecified: Secondary | ICD-10-CM

## 2021-10-27 LAB — CBC WITH DIFFERENTIAL/PLATELET
Abs Immature Granulocytes: 0.01 10*3/uL (ref 0.00–0.07)
Basophils Absolute: 0 10*3/uL (ref 0.0–0.1)
Basophils Relative: 0 %
Eosinophils Absolute: 0 10*3/uL (ref 0.0–0.5)
Eosinophils Relative: 0 %
HCT: 35.6 % — ABNORMAL LOW (ref 36.0–46.0)
Hemoglobin: 11.6 g/dL — ABNORMAL LOW (ref 12.0–15.0)
Immature Granulocytes: 0 %
Lymphocytes Relative: 20 %
Lymphs Abs: 1 10*3/uL (ref 0.7–4.0)
MCH: 32.9 pg (ref 26.0–34.0)
MCHC: 32.6 g/dL (ref 30.0–36.0)
MCV: 100.8 fL — ABNORMAL HIGH (ref 80.0–100.0)
Monocytes Absolute: 0.5 10*3/uL (ref 0.1–1.0)
Monocytes Relative: 11 %
Neutro Abs: 3.3 10*3/uL (ref 1.7–7.7)
Neutrophils Relative %: 69 %
Platelets: 141 10*3/uL — ABNORMAL LOW (ref 150–400)
RBC: 3.53 MIL/uL — ABNORMAL LOW (ref 3.87–5.11)
RDW: 12.2 % (ref 11.5–15.5)
WBC: 4.8 10*3/uL (ref 4.0–10.5)
nRBC: 0 % (ref 0.0–0.2)

## 2021-10-27 LAB — COMPREHENSIVE METABOLIC PANEL
ALT: 8 U/L (ref 0–44)
AST: 11 U/L — ABNORMAL LOW (ref 15–41)
Albumin: 3.8 g/dL (ref 3.5–5.0)
Alkaline Phosphatase: 59 U/L (ref 38–126)
Anion gap: 6 (ref 5–15)
BUN: 19 mg/dL (ref 8–23)
CO2: 28 mmol/L (ref 22–32)
Calcium: 8.9 mg/dL (ref 8.9–10.3)
Chloride: 108 mmol/L (ref 98–111)
Creatinine, Ser: 0.78 mg/dL (ref 0.44–1.00)
GFR, Estimated: >60 mL/min (ref >60)
Glucose, Bld: 109 mg/dL — ABNORMAL HIGH (ref 70–99)
Potassium: 4.1 mmol/L (ref 3.5–5.1)
Sodium: 142 mmol/L (ref 135–145)
Total Bilirubin: 0.3 mg/dL (ref 0.3–1.2)
Total Protein: 6.2 g/dL — ABNORMAL LOW (ref 6.5–8.1)

## 2021-10-27 MED ORDER — HEPARIN SOD (PORK) LOCK FLUSH 100 UNIT/ML IV SOLN
500.0000 [IU] | Freq: Once | INTRAVENOUS | Status: AC | PRN
  Administered 2021-10-27: 500 [IU]

## 2021-10-27 MED ORDER — DEXTROSE 5 % IV SOLN
32.0000 mg/m2 | Freq: Once | INTRAVENOUS | Status: AC
  Administered 2021-10-27: 60 mg via INTRAVENOUS
  Filled 2021-10-27: qty 30

## 2021-10-27 MED ORDER — SODIUM CHLORIDE 0.9 % IV SOLN: Freq: Once | INTRAVENOUS | Status: AC

## 2021-10-27 MED ORDER — SODIUM CHLORIDE 0.9% FLUSH
10.0000 mL | INTRAVENOUS | Status: DC | PRN
  Administered 2021-10-27: 10 mL

## 2021-10-27 MED ORDER — SODIUM CHLORIDE 0.9% FLUSH
10.0000 mL | Freq: Once | INTRAVENOUS | Status: AC
  Administered 2021-10-27: 10 mL

## 2021-10-27 NOTE — Patient Instructions (Signed)
[facility]  Discharge Instructions: Thank you for choosing [facility] to provide your oncology and hematology care.   If you have a lab appointment with the Cancer Center, please go directly to the Cancer Center and check in at the registration area.   Wear comfortable clothing and clothing appropriate for easy access to any Portacath or PICC line.   We strive to give you quality time with your provider. You may need to reschedule your appointment if you arrive late (15 or more minutes).  Arriving late affects you and other patients whose appointments are after yours.  Also, if you miss three or more appointments without notifying the office, you may be dismissed from the clinic at the provider's discretion.      For prescription refill requests, have your pharmacy contact our office and allow 72 hours for refills to be completed.    Today you received the following chemotherapy and/or immunotherapy agent: Carfilzomib (Kyprolis).    To help prevent nausea and vomiting after your treatment, we encourage you to take your nausea medication as directed.  BELOW ARE SYMPTOMS THAT SHOULD BE REPORTED IMMEDIATELY: *FEVER GREATER THAN 100.4 F (38 C) OR HIGHER *CHILLS OR SWEATING *NAUSEA AND VOMITING THAT IS NOT CONTROLLED WITH YOUR NAUSEA MEDICATION *UNUSUAL SHORTNESS OF BREATH *UNUSUAL BRUISING OR BLEEDING *URINARY PROBLEMS (pain or burning when urinating, or frequent urination) *BOWEL PROBLEMS (unusual diarrhea, constipation, pain near the anus) TENDERNESS IN MOUTH AND THROAT WITH OR WITHOUT PRESENCE OF ULCERS (sore throat, sores in mouth, or a toothache) UNUSUAL RASH, SWELLING OR PAIN  UNUSUAL VAGINAL DISCHARGE OR ITCHING   Items with * indicate a potential emergency and should be followed up as soon as possible or go to the Emergency Department if any problems should occur.  Please show the CHEMOTHERAPY ALERT CARD or IMMUNOTHERAPY ALERT CARD at  check-in to the Emergency Department and triage nurse.  Should you have questions after your visit or need to cancel or reschedule your appointment, please contact [facility]  Dept: [phone]  and follow the prompts.  Office hours are 8:00 a.m. to 4:30 p.m. Monday - Friday. Please note that voicemails left after 4:00 p.m. may not be returned until the following business day.  We are closed weekends and major holidays. You have access to a nurse at all times for urgent questions. Please call the main number to the clinic Dept: [phone] and follow the prompts.   For any non-urgent questions, you may also contact your provider using MyChart. We now offer e-Visits for anyone 18 and older to request care online for non-urgent symptoms. For details visit [email or web address].   Also download the MyChart app! Go to the app store, search "MyChart", open the app, select [address], and log in with your MyChart username and password.  Due to Covid, a mask is required upon entering the hospital/clinic. If you do not have a mask, one will be given to you upon arrival. For doctor visits, patients may have 1 support person aged 18 or older with them. For treatment visits, patients cannot have anyone with them due to current Covid guidelines and our immunocompromised population.   

## 2021-11-10 ENCOUNTER — Inpatient Hospital Stay: Payer: Medicare HMO

## 2021-11-10 ENCOUNTER — Inpatient Hospital Stay (HOSPITAL_BASED_OUTPATIENT_CLINIC_OR_DEPARTMENT_OTHER): Payer: Medicare HMO | Admitting: Hematology and Oncology

## 2021-11-10 DIAGNOSIS — D61818 Other pancytopenia: Secondary | ICD-10-CM | POA: Diagnosis not present

## 2021-11-10 DIAGNOSIS — Z5111 Encounter for antineoplastic chemotherapy: Secondary | ICD-10-CM | POA: Diagnosis not present

## 2021-11-10 DIAGNOSIS — G620 Drug-induced polyneuropathy: Secondary | ICD-10-CM | POA: Diagnosis not present

## 2021-11-10 DIAGNOSIS — C9002 Multiple myeloma in relapse: Secondary | ICD-10-CM

## 2021-11-10 DIAGNOSIS — T451X5A Adverse effect of antineoplastic and immunosuppressive drugs, initial encounter: Secondary | ICD-10-CM

## 2021-11-10 DIAGNOSIS — D469 Myelodysplastic syndrome, unspecified: Secondary | ICD-10-CM

## 2021-11-10 LAB — CBC WITH DIFFERENTIAL/PLATELET
Abs Immature Granulocytes: 0.01 10*3/uL (ref 0.00–0.07)
Basophils Absolute: 0 10*3/uL (ref 0.0–0.1)
Basophils Relative: 0 %
Eosinophils Absolute: 0 10*3/uL (ref 0.0–0.5)
Eosinophils Relative: 0 %
HCT: 35.5 % — ABNORMAL LOW (ref 36.0–46.0)
Hemoglobin: 11.7 g/dL — ABNORMAL LOW (ref 12.0–15.0)
Immature Granulocytes: 0 %
Lymphocytes Relative: 22 %
Lymphs Abs: 0.8 10*3/uL (ref 0.7–4.0)
MCH: 33.1 pg (ref 26.0–34.0)
MCHC: 33 g/dL (ref 30.0–36.0)
MCV: 100.3 fL — ABNORMAL HIGH (ref 80.0–100.0)
Monocytes Absolute: 0.5 10*3/uL (ref 0.1–1.0)
Monocytes Relative: 12 %
Neutro Abs: 2.4 10*3/uL (ref 1.7–7.7)
Neutrophils Relative %: 66 %
Platelets: 133 10*3/uL — ABNORMAL LOW (ref 150–400)
RBC: 3.54 MIL/uL — ABNORMAL LOW (ref 3.87–5.11)
RDW: 12.5 % (ref 11.5–15.5)
WBC: 3.7 10*3/uL — ABNORMAL LOW (ref 4.0–10.5)
nRBC: 0 % (ref 0.0–0.2)

## 2021-11-10 LAB — COMPREHENSIVE METABOLIC PANEL
ALT: 7 U/L (ref 0–44)
AST: 11 U/L — ABNORMAL LOW (ref 15–41)
Albumin: 3.7 g/dL (ref 3.5–5.0)
Alkaline Phosphatase: 56 U/L (ref 38–126)
Anion gap: 5 (ref 5–15)
BUN: 20 mg/dL (ref 8–23)
CO2: 29 mmol/L (ref 22–32)
Calcium: 8.7 mg/dL — ABNORMAL LOW (ref 8.9–10.3)
Chloride: 108 mmol/L (ref 98–111)
Creatinine, Ser: 0.72 mg/dL (ref 0.44–1.00)
GFR, Estimated: >60 mL/min (ref >60)
Glucose, Bld: 101 mg/dL — ABNORMAL HIGH (ref 70–99)
Potassium: 4 mmol/L (ref 3.5–5.1)
Sodium: 142 mmol/L (ref 135–145)
Total Bilirubin: 0.3 mg/dL (ref 0.3–1.2)
Total Protein: 6.2 g/dL — ABNORMAL LOW (ref 6.5–8.1)

## 2021-11-10 MED ORDER — SODIUM CHLORIDE 0.9% FLUSH
10.0000 mL | Freq: Once | INTRAVENOUS | Status: AC
  Administered 2021-11-10: 10 mL

## 2021-11-10 MED ORDER — SODIUM CHLORIDE 0.9 % IV SOLN: Freq: Once | INTRAVENOUS | Status: AC

## 2021-11-10 MED ORDER — HEPARIN SOD (PORK) LOCK FLUSH 100 UNIT/ML IV SOLN
500.0000 [IU] | Freq: Once | INTRAVENOUS | Status: AC | PRN
  Administered 2021-11-10: 500 [IU]

## 2021-11-10 MED ORDER — DEXTROSE 5 % IV SOLN
32.0000 mg/m2 | Freq: Once | INTRAVENOUS | Status: AC
  Administered 2021-11-10: 60 mg via INTRAVENOUS
  Filled 2021-11-10: qty 30

## 2021-11-10 MED ORDER — SODIUM CHLORIDE 0.9% FLUSH
10.0000 mL | INTRAVENOUS | Status: DC | PRN
  Administered 2021-11-10: 10 mL

## 2021-11-10 NOTE — Patient Instructions (Signed)
[facility]  Discharge Instructions: Thank you for choosing [facility] to provide your oncology and hematology care.   If you have a lab appointment with the Cancer Center, please go directly to the Cancer Center and check in at the registration area.   Wear comfortable clothing and clothing appropriate for easy access to any Portacath or PICC line.   We strive to give you quality time with your provider. You may need to reschedule your appointment if you arrive late (15 or more minutes).  Arriving late affects you and other patients whose appointments are after yours.  Also, if you miss three or more appointments without notifying the office, you may be dismissed from the clinic at the provider's discretion.      For prescription refill requests, have your pharmacy contact our office and allow 72 hours for refills to be completed.    Today you received the following chemotherapy and/or immunotherapy agents kyprolis      To help prevent nausea and vomiting after your treatment, we encourage you to take your nausea medication as directed.  BELOW ARE SYMPTOMS THAT SHOULD BE REPORTED IMMEDIATELY: . *FEVER GREATER THAN 100.4 F (38 C) OR HIGHER . *CHILLS OR SWEATING . *NAUSEA AND VOMITING THAT IS NOT CONTROLLED WITH YOUR NAUSEA MEDICATION . *UNUSUAL SHORTNESS OF BREATH . *UNUSUAL BRUISING OR BLEEDING . *URINARY PROBLEMS (pain or burning when urinating, or frequent urination) . *BOWEL PROBLEMS (unusual diarrhea, constipation, pain near the anus) . TENDERNESS IN MOUTH AND THROAT WITH OR WITHOUT PRESENCE OF ULCERS (sore throat, sores in mouth, or a toothache) . UNUSUAL RASH, SWELLING OR PAIN  . UNUSUAL VAGINAL DISCHARGE OR ITCHING   Items with * indicate a potential emergency and should be followed up as soon as possible or go to the Emergency Department if any problems should occur.  Please show the CHEMOTHERAPY ALERT CARD or IMMUNOTHERAPY ALERT  CARD at check-in to the Emergency Department and triage nurse.  Should you have questions after your visit or need to cancel or reschedule your appointment, please contact [facility]  Dept: [phone]  and follow the prompts.  Office hours are 8:00 a.m. to 4:30 p.m. Monday - Friday. Please note that voicemails left after 4:00 p.m. may not be returned until the following business day.  We are closed weekends and major holidays. You have access to a nurse at all times for urgent questions. Please call the main number to the clinic Dept: [phone] and follow the prompts.   For any non-urgent questions, you may also contact your provider using MyChart. We now offer e-Visits for anyone 18 and older to request care online for non-urgent symptoms. For details visit [email or web address].   Also download the MyChart app! Go to the app store, search "MyChart", open the app, select [facility], and log in with your MyChart username and password.  Due to Covid, a mask is required upon entering the hospital/clinic. If you do not have a mask, one will be given to you upon arrival. For doctor visits, patients may have 1 support person aged 18 or older with them. For treatment visits, patients cannot have anyone with them due to current Covid guidelines and our immunocompromised population.   

## 2021-11-11 ENCOUNTER — Encounter: Payer: Self-pay | Admitting: Hematology and Oncology

## 2021-11-11 ENCOUNTER — Inpatient Hospital Stay: Payer: Medicare HMO

## 2021-11-11 ENCOUNTER — Inpatient Hospital Stay: Payer: Medicare HMO | Admitting: Hematology and Oncology

## 2021-11-11 LAB — KAPPA/LAMBDA LIGHT CHAINS
Kappa free light chain: 8.7 mg/L (ref 3.3–19.4)
Kappa, lambda light chain ratio: 0.11 — ABNORMAL LOW (ref 0.26–1.65)
Lambda free light chains: 81.5 mg/L — ABNORMAL HIGH (ref 5.7–26.3)

## 2021-11-11 NOTE — Assessment & Plan Note (Signed)
The cause of her pancytopenia is multifactorial, likely due to treatment She is not symptomatic Observe only

## 2021-11-11 NOTE — Assessment & Plan Note (Signed)
Overall, she has positive response to treatment So far, she tolerated 36 mg per metered square of kyprolis She has mild pancytopenia but overall asymptomatic The patient would like to come off dexamethasone weekly due to progressive weight gain I think it is reasonable However, if her light chains start to trend up, we might have to add dexamethasone back or increase the dose of Kyprolis

## 2021-11-11 NOTE — Assessment & Plan Note (Signed)
She has residual neuropathy from prior treatment We have extensive discussion about the role of additional medication such as gabapentin or Lyrica She is not interested to start those treatment now

## 2021-11-11 NOTE — Progress Notes (Signed)
[facility] OFFICE PROGRESS NOTE  Patient Care Team: [name], [name], MD as PCP - General (Internal Medicine) [name], MD as PCP - Cardiology (Cardiology) [name], MD as Referring Physician (Hematology and Oncology) [name], MD as Consulting Physician (Internal Medicine) [name], [name], MD as Consulting Physician (Infectious Diseases) [name], MD as Consulting Physician (Hematology and Oncology) [name], NP as Nurse Practitioner (Hematology and Oncology) Jean Lark, MD as Consulting Physician (Hematology and Oncology)  ASSESSMENT & PLAN:  Multiple myeloma in relapse ([place]) Overall, she has positive response to treatment So far, she tolerated 36 mg per metered square of kyprolis She has mild pancytopenia but overall asymptomatic The patient would like to come off dexamethasone weekly due to progressive weight gain I think it is reasonable However, if her light chains start to trend up, we might have to add dexamethasone back or increase the dose of Kyprolis   Pancytopenia, acquired [facility]) The cause of her pancytopenia is multifactorial, likely due to treatment She is not symptomatic Observe only  Peripheral neuropathy due to chemotherapy [facility]) She has residual neuropathy from prior treatment We have extensive discussion about the role of additional medication such as gabapentin or Lyrica She is not interested to start those treatment now  No orders of the defined types were placed in this encounter.   All questions were answered. The patient knows to call the clinic with any problems, questions or concerns. The total time spent in the appointment was 20 minutes encounter with patients including review of chart and various tests results, discussions about plan of care and coordination of care plan   Jean Lark, MD 11/11/2021 9:35 AM  INTERVAL HISTORY: Please see below for problem oriented  charting. she returns for treatment follow-up on Chi prolapse for recurrent myeloma She complained of mild numbness and sensation of feeling cold in the feet Denies recent infection No new bone pain  REVIEW OF SYSTEMS:   Constitutional: Denies fevers, chills or abnormal weight loss Eyes: Denies blurriness of vision Ears, nose, mouth, throat, and face: Denies mucositis or sore throat Respiratory: Denies cough, dyspnea or wheezes Cardiovascular: Denies palpitation, chest discomfort or lower extremity swelling Gastrointestinal:  Denies nausea, heartburn or change in bowel habits Skin: Denies abnormal skin rashes Lymphatics: Denies new lymphadenopathy or easy bruising Behavioral/Psych: Mood is stable, no new changes  All other systems were reviewed with the patient and are negative.  I have reviewed the past medical history, past surgical history, social history and family history with the patient and they are unchanged from previous note.  ALLERGIES:  has No Known Allergies.  MEDICATIONS:  Current Outpatient Medications  Medication Sig Dispense Refill   acyclovir (ZOVIRAX) 400 MG tablet TAKE 1 TABLET BY MOUTH TWICE A DAY 180 tablet 11   aspirin 325 MG tablet Take 325 mg by mouth daily.     calcium carbonate (TUMS - DOSED IN MG ELEMENTAL CALCIUM) 500 MG chewable tablet Chew 1 tablet by mouth daily.     Carfilzomib (KYPROLIS IV) Inject into the vein once a week.     Cholecalciferol 25 MCG (1000 UT) tablet Take 1,000 Units by mouth daily.     Cyanocobalamin (VITAMIN B12 PO) Take 1 tablet by mouth daily.     loperamide (IMODIUM) 2 MG capsule Take by mouth as needed for diarrhea or loose stools. As needed only     metoprolol tartrate (LOPRESSOR) 25 MG tablet TAKE 1.5 TABLETS (37.5 MG  TOTAL) BY MOUTH 2 (TWO) TIMES DAILY. 270 tablet 1   mirtazapine (REMERON) 7.5 MG tablet Take 1 tablet (7.5 mg total) by mouth at bedtime. 90 tablet 1   Multiple Vitamin (MULTIVITAMIN WITH MINERALS) TABS tablet  Take 1 tablet by mouth daily.     Multiple Vitamins-Minerals (PRESERVISION AREDS 2 PO) Take 1 tablet by mouth 2 (two) times daily.     ondansetron (ZOFRAN) 8 MG tablet Take 1 tablet (8 mg total) by mouth every 8 (eight) hours as needed (Nausea or vomiting). 30 tablet 1   pantoprazole (PROTONIX) 40 MG tablet TAKE 1 TABLET BY MOUTH 2 TIMES DAILY. TAKE 30-60 MINUTES BEFORE BREAKFAST AND DINNER. 180 tablet 2   prochlorperazine (COMPAZINE) 10 MG tablet Take 1 tablet (10 mg total) by mouth every 6 (six) hours as needed (Nausea or vomiting). 30 tablet 1   No current facility-administered medications for this visit.    SUMMARY OF ONCOLOGIC HISTORY: Oncology History Overview Note  Multiple myeloma, Ig A Lambda, M spike 3.54 grams, Calcium 9.2, Creatinine 0.8, Beta 2 microglobulin 4.52, IgA 4840 mg/dL, lambda light chain 75.4, albumin 3.6, hemoglobin 9.7, platelet 115    Primary site: Multiple Myeloma   Staging method: AJCC 6th Edition   Clinical: Stage IIA signed by Jean Lark, MD on 11/07/2013  2:46 PM   Summary: Stage IIA S/p Allo transplant at [facility] on revlimid, pomalyst, daratumumab and velcade     Multiple myeloma in relapse ([place])  10/31/2013 Bone Marrow Biopsy   Bone marrow biopsy confirmed multiple myeloma with 40% bone marrow involvement. Skeletal survey showed minimal lesions in her score with generalized demineralization    11/10/2013 - 02/13/2014 Chemotherapy   The patient is started on induction chemotherapy with weekly dexamethasone 40 mg by mouth as well as Velcade subcutaneous injection on days 1, 4, 8 and 11. On 11/21/2013, she was started on monthly Zometa.    12/23/2013 Adverse Reaction   The dose of Velcade was reduced due to thrombocytopenia.    01/28/2014 - 04/07/2014 Chemotherapy   Revlimid is added. Treatment was discontinued due to lack of response.    02/24/2014 - 04/07/2014 Chemotherapy   Due to worsening peripheral neuropathy, Velcade injection is changed to  once a week. Revlimid was given 21 days on, 7 days off.    04/07/2014 - 04/10/2014 Chemotherapy   Revlimid was discontinued due to lack of response. Chemotherapy was changed back to Velcade injection twice a week, 2 weeks on 1 week off. Her treatment was switched to to minimum response    04/20/2014 - 06/02/2014 Chemotherapy   chemotherapy is switched to Carfilzomib, Cytoxan and dexamethasone.    04/22/2014 Procedure   she has placement of port for chemotherapy.    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 at [facility]    10/20/2014 - 03/24/2015 Chemotherapy    she received chemotherapy with Kyprolis, Revlimid and dexamethasone    10/22/2014 Procedure   She has port placement    01/19/2015 Tumor Marker   IgA lambda M spike at 0.4 g     01/20/2015 Miscellaneous  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 No cource was found and fever resolved with IV vancomycin and meropenem    09/13/2015 Bone Marrow Biopsy   Bone marrow biopsy showed no increased blasts, 3-4 % plasma cells    03/02/2016 Bone Marrow Biopsy   Bone marrow biopsy at [facility]  showed normocellular (30%) bone marrow with trilineage hematopoiesis. No significant increase in blasts. No significant increase in plasma cells.    05/12/2016 Imaging   DEXA scan at [place] showed osteopenia    10/24/2016 Imaging   Skeletal survey at [facility], no new lesions    12/07/2017 Imaging   No focal abnormality noted to suggest myeloma. Exam is stable from prior exam.    03/01/2018 Procedure   Successful 8 French right internal jugular vein power port placement with its tip at the SVC/RA junction.    03/06/2018 -  Chemotherapy   The patient had daratumumab     07/09/2018 Bone Marrow Biopsy   Bone marrow biopsy at [facility] showed residual disease at 0.004% plasma cells    07/03/2019 Bone Marrow Biopsy   A. Bone marrow, flow cytometric analysis for multiple myeloma minimal residual disease detection:   Negative. No phenotypically abnormal plasma cells at or above the limit of detection identified.     No monotypic B-cell population identified. Negative for increased blasts.   03/25/2021 Bone Marrow Biopsy   Repeat bone marrow biopsy at [place] showed female donor karyotype.  5 to 7% plasma cell is seen.  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   04/20/2021 Echocardiogram   1. Left ventricular ejection fraction, by estimation, is 60 to 65%. The left ventricle has normal function. The left ventricle has no regional wall motion abnormalities. Left ventricular diastolic parameters were normal.  2. Right ventricular systolic function is normal. The right ventricular size is normal.  3. The mitral valve is normal in structure. No evidence of mitral valve regurgitation. No evidence of mitral stenosis.  4. The aortic valve is normal in structure. Aortic valve regurgitation is not visualized. No aortic stenosis is present.  5. The  inferior vena cava is normal in size with greater than 50% respiratory variability, suggesting right atrial pressure of 3 mmHg.   04/29/2021 -  Chemotherapy   Patient is on Treatment Plan : MYELOMA RELAPSED/ REFRACTORY Carfilzomib D1,8,15 (20/27) + Dexamethasone (KPd) q28d      05/04/2021 PET scan   1. No evidence active multiple myeloma within the skeleton on FDG PET scan. 2. No suspicious lytic lesion on CT portion exam. 3. No plasmacytoma.   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    10/31/2015 Miscellaneous   She has engrafted      PHYSICAL EXAMINATION: ECOG PERFORMANCE STATUS: 1 - Symptomatic but completely ambulatory  Vitals:   11/10/21 0844  BP: (!) 156/66  Pulse: 66  Resp: 18  Temp: (!) 97.4 F (36.3 C)  SpO2: 99%   Filed Weights   11/10/21 0844  Weight: 166 lb 3.2 oz (75.4 kg)    GENERAL:alert, no distress and comfortable NEURO: alert & oriented x 3 with fluent speech, no focal motor/sensory deficits  LABORATORY DATA:  I have reviewed the data as listed    Component Value Date/Time   NA 142 11/10/2021 0807   NA 142 01/04/2017 0902   K 4.0 11/10/2021 0807   K 3.9 01/04/2017 0902   CL 108 11/10/2021 0807   CO2 29 11/10/2021 0807   CO2 28 01/04/2017 0902   GLUCOSE 101 (H) 11/10/2021 0807   GLUCOSE 92 01/04/2017 0902   BUN 20 11/10/2021 0807   BUN 9.5  01/04/2017 0902   CREATININE 0.72 11/10/2021 0807   CREATININE 0.82 10/13/2021 0742   CREATININE 0.7 01/04/2017 0902   CALCIUM 8.7 (L) 11/10/2021 0807   CALCIUM 9.0 01/04/2017 0902   PROT 6.2 (L) 11/10/2021 0807   PROT 6.0 (L) 01/04/2017 0902   PROT 5.8 (L) 01/04/2017 0902   ALBUMIN 3.7 11/10/2021 0807   ALBUMIN 3.4 (L) 01/04/2017 0902   AST 11 (L) 11/10/2021 0807   AST 13 (L) 10/13/2021 0742   AST 21 01/04/2017 0902   ALT 7 11/10/2021 0807   ALT 9 10/13/2021 0742   ALT 16 01/04/2017 0902   ALKPHOS 56 11/10/2021 0807   ALKPHOS 101 01/04/2017 0902   BILITOT 0.3 11/10/2021 0807   BILITOT 0.3 10/13/2021 0742   BILITOT 0.56 01/04/2017 0902   GFRNONAA >60 11/10/2021 0807   GFRNONAA >60 10/13/2021 0742   GFRAA >60 03/25/2020 0917   GFRAA >60 12/18/2018 0901    No results found for: SPEP, UPEP  Lab Results  Component Value Date   WBC 3.7 (L) 11/10/2021   NEUTROABS 2.4 11/10/2021   HGB 11.7 (L) 11/10/2021   HCT 35.5 (L) 11/10/2021   MCV 100.3 (H) 11/10/2021   PLT 133 (L) 11/10/2021      Chemistry      Component Value Date/Time   NA 142 11/10/2021 0807   NA 142 01/04/2017 0902   K 4.0 11/10/2021 0807   K 3.9 01/04/2017 0902   CL 108 11/10/2021 0807   CO2 29 11/10/2021 0807   CO2 28 01/04/2017 0902   BUN 20 11/10/2021 0807   BUN 9.5 01/04/2017 0902   CREATININE 0.72 11/10/2021 0807   CREATININE 0.82 10/13/2021 0742   CREATININE 0.7 01/04/2017 0902      Component Value Date/Time   CALCIUM 8.7 (L) 11/10/2021 0807   CALCIUM 9.0 01/04/2017 0902   ALKPHOS 56 11/10/2021 0807   ALKPHOS 101 01/04/2017 0902   AST 11 (L) 11/10/2021 0807   AST 13 (L) 10/13/2021 0742   AST 21 01/04/2017 0902   ALT 7 11/10/2021 0807   ALT 9 10/13/2021 0742   ALT 16 01/04/2017 0902   BILITOT 0.3 11/10/2021 0807   BILITOT 0.3 10/13/2021 0742   BILITOT 0.56 01/04/2017 0902

## 2021-11-12 ENCOUNTER — Other Ambulatory Visit: Payer: Self-pay | Admitting: Cardiovascular Disease

## 2021-11-14 ENCOUNTER — Telehealth: Payer: Self-pay

## 2021-11-14 ENCOUNTER — Other Ambulatory Visit: Payer: Self-pay | Admitting: Hematology and Oncology

## 2021-11-14 DIAGNOSIS — C9002 Multiple myeloma in relapse: Secondary | ICD-10-CM

## 2021-11-14 LAB — MULTIPLE MYELOMA PANEL, SERUM
Albumin SerPl Elph-Mcnc: 3.3 g/dL (ref 2.9–4.4)
Albumin/Glob SerPl: 1.4 (ref 0.7–1.7)
Alpha 1: 0.2 g/dL (ref 0.0–0.4)
Alpha2 Glob SerPl Elph-Mcnc: 0.7 g/dL (ref 0.4–1.0)
B-Globulin SerPl Elph-Mcnc: 0.8 g/dL (ref 0.7–1.3)
Gamma Glob SerPl Elph-Mcnc: 0.6 g/dL (ref 0.4–1.8)
Globulin, Total: 2.4 g/dL (ref 2.2–3.9)
IgA: 250 mg/dL (ref 64–422)
IgG (Immunoglobin G), Serum: 324 mg/dL — ABNORMAL LOW (ref 586–1602)
IgM (Immunoglobulin M), Srm: 17 mg/dL — ABNORMAL LOW (ref 26–217)
M Protein SerPl Elph-Mcnc: 0.3 g/dL — ABNORMAL HIGH
Total Protein ELP: 5.7 g/dL — ABNORMAL LOW (ref 6.0–8.5)

## 2021-11-14 MED ORDER — DEXAMETHASONE 4 MG PO TABS: 4.0000 mg | ORAL_TABLET | ORAL | 5 refills | Status: DC

## 2021-11-14 NOTE — Telephone Encounter (Signed)
That would be quicker She can take '4mg'$  dexamethasone about 1-2 hours before her scheduled treatment I will send it to her pharmacy, to make it easier it will say "1 hour before chemo weekly"

## 2021-11-14 NOTE — Telephone Encounter (Signed)
She called and left a message. Since stopping the dexamethasone with her treatment. About 14 to 24 hours later she starts having aching and low grade fevers. She would like to get a Rx of dexamethasone to take prior to treatment for future treatments. She is asking is it possible that she would just take the dexamethasone herself prior to infusion appt? Then the infusion appt would not be so long.

## 2021-11-14 NOTE — Telephone Encounter (Signed)
Called and given below message. She verbalized understanding. 

## 2021-11-14 NOTE — Telephone Encounter (Signed)
Please contact pt for future appointment. Pt needing refills. 

## 2021-11-14 NOTE — Telephone Encounter (Signed)
Scheduled

## 2021-11-14 NOTE — Telephone Encounter (Signed)
Attempted to schedule.  

## 2021-11-18 ENCOUNTER — Other Ambulatory Visit: Payer: Self-pay

## 2021-11-18 ENCOUNTER — Inpatient Hospital Stay: Payer: Medicare HMO

## 2021-11-18 VITALS — BP 153/71 | HR 58 | Temp 99.2°F | Resp 18 | Wt 164.5 lb

## 2021-11-18 DIAGNOSIS — Z5111 Encounter for antineoplastic chemotherapy: Secondary | ICD-10-CM | POA: Diagnosis not present

## 2021-11-18 DIAGNOSIS — C9002 Multiple myeloma in relapse: Secondary | ICD-10-CM

## 2021-11-18 DIAGNOSIS — D469 Myelodysplastic syndrome, unspecified: Secondary | ICD-10-CM

## 2021-11-18 LAB — CBC WITH DIFFERENTIAL/PLATELET
Abs Immature Granulocytes: 0.01 10*3/uL (ref 0.00–0.07)
Basophils Absolute: 0 10*3/uL (ref 0.0–0.1)
Basophils Relative: 1 %
Eosinophils Absolute: 0 10*3/uL (ref 0.0–0.5)
Eosinophils Relative: 0 %
HCT: 36.2 % (ref 36.0–46.0)
Hemoglobin: 12 g/dL (ref 12.0–15.0)
Immature Granulocytes: 0 %
Lymphocytes Relative: 21 %
Lymphs Abs: 0.9 10*3/uL (ref 0.7–4.0)
MCH: 32.9 pg (ref 26.0–34.0)
MCHC: 33.1 g/dL (ref 30.0–36.0)
MCV: 99.2 fL (ref 80.0–100.0)
Monocytes Absolute: 0.6 10*3/uL (ref 0.1–1.0)
Monocytes Relative: 13 %
Neutro Abs: 2.8 10*3/uL (ref 1.7–7.7)
Neutrophils Relative %: 65 %
Platelets: 137 10*3/uL — ABNORMAL LOW (ref 150–400)
RBC: 3.65 MIL/uL — ABNORMAL LOW (ref 3.87–5.11)
RDW: 12.4 % (ref 11.5–15.5)
WBC: 4.4 10*3/uL (ref 4.0–10.5)
nRBC: 0 % (ref 0.0–0.2)

## 2021-11-18 LAB — COMPREHENSIVE METABOLIC PANEL
ALT: 7 U/L (ref 0–44)
AST: 12 U/L — ABNORMAL LOW (ref 15–41)
Albumin: 3.9 g/dL (ref 3.5–5.0)
Alkaline Phosphatase: 62 U/L (ref 38–126)
Anion gap: 6 (ref 5–15)
BUN: 22 mg/dL (ref 8–23)
CO2: 29 mmol/L (ref 22–32)
Calcium: 9.2 mg/dL (ref 8.9–10.3)
Chloride: 105 mmol/L (ref 98–111)
Creatinine, Ser: 0.78 mg/dL (ref 0.44–1.00)
GFR, Estimated: >60 mL/min (ref >60)
Glucose, Bld: 99 mg/dL (ref 70–99)
Potassium: 4 mmol/L (ref 3.5–5.1)
Sodium: 140 mmol/L (ref 135–145)
Total Bilirubin: 0.4 mg/dL (ref 0.3–1.2)
Total Protein: 6.4 g/dL — ABNORMAL LOW (ref 6.5–8.1)

## 2021-11-18 MED ORDER — SODIUM CHLORIDE 0.9% FLUSH
10.0000 mL | Freq: Once | INTRAVENOUS | Status: AC
  Administered 2021-11-18: 10 mL

## 2021-11-18 MED ORDER — HEPARIN SOD (PORK) LOCK FLUSH 100 UNIT/ML IV SOLN
500.0000 [IU] | Freq: Once | INTRAVENOUS | Status: AC | PRN
  Administered 2021-11-18: 500 [IU]

## 2021-11-18 MED ORDER — SODIUM CHLORIDE 0.9 % IV SOLN: Freq: Once | INTRAVENOUS | Status: DC

## 2021-11-18 MED ORDER — SODIUM CHLORIDE 0.9% FLUSH
10.0000 mL | INTRAVENOUS | Status: DC | PRN
  Administered 2021-11-18: 10 mL

## 2021-11-18 MED ORDER — SODIUM CHLORIDE 0.9 % IV SOLN: Freq: Once | INTRAVENOUS | Status: AC

## 2021-11-18 MED ORDER — DEXTROSE 5 % IV SOLN
60.0000 mg | Freq: Once | INTRAVENOUS | Status: AC
  Administered 2021-11-18: 60 mg via INTRAVENOUS
  Filled 2021-11-18: qty 30

## 2021-11-18 NOTE — Patient Instructions (Signed)
[facility] MEDICAL ONCOLOGY  Discharge Instructions: Thank you for choosing [facility] to provide your oncology and hematology care.   If you have a lab appointment with the Cancer Center, please go directly to the Cancer Center and check in at the registration area.   Wear comfortable clothing and clothing appropriate for easy access to any Portacath or PICC line.   We strive to give you quality time with your provider. You may need to reschedule your appointment if you arrive late (15 or more minutes).  Arriving late affects you and other patients whose appointments are after yours.  Also, if you miss three or more appointments without notifying the office, you may be dismissed from the clinic at the provider's discretion.      For prescription refill requests, have your pharmacy contact our office and allow 72 hours for refills to be completed.    Today you received the following chemotherapy and/or immunotherapy agents kyprolis      To help prevent nausea and vomiting after your treatment, we encourage you to take your nausea medication as directed.  BELOW ARE SYMPTOMS THAT SHOULD BE REPORTED IMMEDIATELY: . *FEVER GREATER THAN 100.4 F (38 C) OR HIGHER . *CHILLS OR SWEATING . *NAUSEA AND VOMITING THAT IS NOT CONTROLLED WITH YOUR NAUSEA MEDICATION . *UNUSUAL SHORTNESS OF BREATH . *UNUSUAL BRUISING OR BLEEDING . *URINARY PROBLEMS (pain or burning when urinating, or frequent urination) . *BOWEL PROBLEMS (unusual diarrhea, constipation, pain near the anus) . TENDERNESS IN MOUTH AND THROAT WITH OR WITHOUT PRESENCE OF ULCERS (sore throat, sores in mouth, or a toothache) . UNUSUAL RASH, SWELLING OR PAIN  . UNUSUAL VAGINAL DISCHARGE OR ITCHING   Items with * indicate a potential emergency and should be followed up as soon as possible or go to the Emergency Department if any problems should occur.  Please show the CHEMOTHERAPY ALERT CARD or IMMUNOTHERAPY ALERT  CARD at check-in to the Emergency Department and triage nurse.  Should you have questions after your visit or need to cancel or reschedule your appointment, please contact [facility]ICAL ONCOLOG[facility]  Dept: [phone]  and follow the prompts.  Office hours are 8:00 a.m. to 4:30 p.m. Monday - Friday. Please note that voicemails left after 4:00 p.m. may not be returned until the following business day.  We are closed weekends and major holidays. You have access to a nurse at all times for urgent questions. Please call the main number to the clinic Dept: [phone] and follow the prompts.   For any non-urgent questions, you may also contact your provider using MyChart. We now offer e-Visits for anyone 18 and older to request care online for non-urgent symptoms. For details visit [email or web address].   Also download the MyChart app! Go to the app store, search "MyChart", open the app, select [facility], and log in with your MyChart username and password.  Due to Covid, a mask is required upon entering the hospital/clinic. If you do not have a mask, one will be given to you upon arrival. For doctor visits, patients may have 1 support person aged 18 or older with them. For treatment visits, patients cannot have anyone with them due to current Covid guidelines and our immunocompromised population.   

## 2021-11-25 ENCOUNTER — Inpatient Hospital Stay: Payer: Medicare HMO

## 2021-11-25 ENCOUNTER — Other Ambulatory Visit: Payer: Self-pay

## 2021-11-25 ENCOUNTER — Inpatient Hospital Stay: Payer: Medicare HMO | Attending: Hematology and Oncology

## 2021-11-25 VITALS — BP 144/79 | HR 63 | Temp 97.8°F | Resp 18 | Wt 164.2 lb

## 2021-11-25 DIAGNOSIS — D469 Myelodysplastic syndrome, unspecified: Secondary | ICD-10-CM

## 2021-11-25 DIAGNOSIS — Z5111 Encounter for antineoplastic chemotherapy: Secondary | ICD-10-CM | POA: Diagnosis present

## 2021-11-25 DIAGNOSIS — C9002 Multiple myeloma in relapse: Secondary | ICD-10-CM | POA: Insufficient documentation

## 2021-11-25 DIAGNOSIS — D61818 Other pancytopenia: Secondary | ICD-10-CM | POA: Diagnosis not present

## 2021-11-25 LAB — CBC WITH DIFFERENTIAL/PLATELET
Abs Immature Granulocytes: 0.02 10*3/uL (ref 0.00–0.07)
Basophils Absolute: 0 10*3/uL (ref 0.0–0.1)
Basophils Relative: 1 %
Eosinophils Absolute: 0.2 10*3/uL (ref 0.0–0.5)
Eosinophils Relative: 3 %
HCT: 35.6 % — ABNORMAL LOW (ref 36.0–46.0)
Hemoglobin: 12.1 g/dL (ref 12.0–15.0)
Immature Granulocytes: 0 %
Lymphocytes Relative: 20 %
Lymphs Abs: 1.2 10*3/uL (ref 0.7–4.0)
MCH: 33.8 pg (ref 26.0–34.0)
MCHC: 34 g/dL (ref 30.0–36.0)
MCV: 99.4 fL (ref 80.0–100.0)
Monocytes Absolute: 0.8 10*3/uL (ref 0.1–1.0)
Monocytes Relative: 13 %
Neutro Abs: 3.9 10*3/uL (ref 1.7–7.7)
Neutrophils Relative %: 63 %
Platelets: 142 10*3/uL — ABNORMAL LOW (ref 150–400)
RBC: 3.58 MIL/uL — ABNORMAL LOW (ref 3.87–5.11)
RDW: 12.5 % (ref 11.5–15.5)
WBC: 6.2 10*3/uL (ref 4.0–10.5)
nRBC: 0 % (ref 0.0–0.2)

## 2021-11-25 LAB — COMPREHENSIVE METABOLIC PANEL
ALT: 8 U/L (ref 0–44)
AST: 12 U/L — ABNORMAL LOW (ref 15–41)
Albumin: 4 g/dL (ref 3.5–5.0)
Alkaline Phosphatase: 61 U/L (ref 38–126)
Anion gap: 6 (ref 5–15)
BUN: 24 mg/dL — ABNORMAL HIGH (ref 8–23)
CO2: 29 mmol/L (ref 22–32)
Calcium: 9.6 mg/dL (ref 8.9–10.3)
Chloride: 105 mmol/L (ref 98–111)
Creatinine, Ser: 0.76 mg/dL (ref 0.44–1.00)
GFR, Estimated: >60 mL/min (ref >60)
Glucose, Bld: 100 mg/dL — ABNORMAL HIGH (ref 70–99)
Potassium: 4.2 mmol/L (ref 3.5–5.1)
Sodium: 140 mmol/L (ref 135–145)
Total Bilirubin: 0.3 mg/dL (ref 0.3–1.2)
Total Protein: 6.4 g/dL — ABNORMAL LOW (ref 6.5–8.1)

## 2021-11-25 MED ORDER — SODIUM CHLORIDE 0.9 % IV SOLN: Freq: Once | INTRAVENOUS | Status: AC

## 2021-11-25 MED ORDER — SODIUM CHLORIDE 0.9% FLUSH
10.0000 mL | Freq: Once | INTRAVENOUS | Status: AC
  Administered 2021-11-25: 10 mL

## 2021-11-25 MED ORDER — HEPARIN SOD (PORK) LOCK FLUSH 100 UNIT/ML IV SOLN
500.0000 [IU] | Freq: Once | INTRAVENOUS | Status: AC | PRN
  Administered 2021-11-25: 500 [IU]

## 2021-11-25 MED ORDER — SODIUM CHLORIDE 0.9% FLUSH
10.0000 mL | INTRAVENOUS | Status: DC | PRN
  Administered 2021-11-25: 10 mL

## 2021-11-25 MED ORDER — DEXTROSE 5 % IV SOLN
60.0000 mg | Freq: Once | INTRAVENOUS | Status: AC
  Administered 2021-11-25: 60 mg via INTRAVENOUS
  Filled 2021-11-25: qty 30

## 2021-11-25 MED ORDER — ZOLEDRONIC ACID 4 MG/100ML IV SOLN
4.0000 mg | Freq: Once | INTRAVENOUS | Status: AC
  Administered 2021-11-25: 4 mg via INTRAVENOUS
  Filled 2021-11-25: qty 100

## 2021-11-25 NOTE — Patient Instructions (Signed)
[facility] MEDICAL ONCOLOGY  Discharge Instructions: Thank you for choosing [facility] to provide your oncology and hematology care.   If you have a lab appointment with the Cancer Center, please go directly to the Cancer Center and check in at the registration area.   Wear comfortable clothing and clothing appropriate for easy access to any Portacath or PICC line.   We strive to give you quality time with your provider. You may need to reschedule your appointment if you arrive late (15 or more minutes).  Arriving late affects you and other patients whose appointments are after yours.  Also, if you miss three or more appointments without notifying the office, you may be dismissed from the clinic at the provider's discretion.      For prescription refill requests, have your pharmacy contact our office and allow 72 hours for refills to be completed.    Today you received the following chemotherapy and/or immunotherapy agents kyprolis      To help prevent nausea and vomiting after your treatment, we encourage you to take your nausea medication as directed.  BELOW ARE SYMPTOMS THAT SHOULD BE REPORTED IMMEDIATELY: . *FEVER GREATER THAN 100.4 F (38 C) OR HIGHER . *CHILLS OR SWEATING . *NAUSEA AND VOMITING THAT IS NOT CONTROLLED WITH YOUR NAUSEA MEDICATION . *UNUSUAL SHORTNESS OF BREATH . *UNUSUAL BRUISING OR BLEEDING . *URINARY PROBLEMS (pain or burning when urinating, or frequent urination) . *BOWEL PROBLEMS (unusual diarrhea, constipation, pain near the anus) . TENDERNESS IN MOUTH AND THROAT WITH OR WITHOUT PRESENCE OF ULCERS (sore throat, sores in mouth, or a toothache) . UNUSUAL RASH, SWELLING OR PAIN  . UNUSUAL VAGINAL DISCHARGE OR ITCHING   Items with * indicate a potential emergency and should be followed up as soon as possible or go to the Emergency Department if any problems should occur.  Please show the CHEMOTHERAPY ALERT CARD or IMMUNOTHERAPY ALERT  CARD at check-in to the Emergency Department and triage nurse.  Should you have questions after your visit or need to cancel or reschedule your appointment, please contact [facility] MEDICAL ONCOLOG[facility]  Dept: [phone]  and follow the prompts.  Office hours are 8:00 a.m. to 4:30 p.m. Monday - Friday. Please note that voicemails left after 4:00 p.m. may not be returned until the following business day.  We are closed weekends and major holidays. You have access to a nurse at all times for urgent questions. Please call the main number to the clinic Dept: [phone] and follow the prompts.   For any non-urgent questions, you may also contact your provider using MyChart. We now offer e-Visits for anyone 18 and older to request care online for non-urgent symptoms. For details visit [email or web address].   Also download the MyChart app! Go to the app store, search "MyChart", open the app, select [facility], and log in with your MyChart username and password.  Due to Covid, a mask is required upon entering the hospital/clinic. If you do not have a mask, one will be given to you upon arrival. For doctor visits, patients may have 1 support person aged 18 or older with them. For treatment visits, patients cannot have anyone with them due to current Covid guidelines and our immunocompromised population.   

## 2021-12-09 ENCOUNTER — Inpatient Hospital Stay: Payer: Medicare HMO

## 2021-12-09 ENCOUNTER — Other Ambulatory Visit: Payer: Self-pay

## 2021-12-09 VITALS — BP 140/68 | HR 64 | Temp 98.8°F | Resp 16 | Wt 163.8 lb

## 2021-12-09 DIAGNOSIS — Z5111 Encounter for antineoplastic chemotherapy: Secondary | ICD-10-CM | POA: Diagnosis not present

## 2021-12-09 DIAGNOSIS — C9002 Multiple myeloma in relapse: Secondary | ICD-10-CM

## 2021-12-09 DIAGNOSIS — D469 Myelodysplastic syndrome, unspecified: Secondary | ICD-10-CM

## 2021-12-09 LAB — COMPREHENSIVE METABOLIC PANEL
ALT: 8 U/L (ref 0–44)
AST: 11 U/L — ABNORMAL LOW (ref 15–41)
Albumin: 3.8 g/dL (ref 3.5–5.0)
Alkaline Phosphatase: 59 U/L (ref 38–126)
Anion gap: 7 (ref 5–15)
BUN: 25 mg/dL — ABNORMAL HIGH (ref 8–23)
CO2: 28 mmol/L (ref 22–32)
Calcium: 9.3 mg/dL (ref 8.9–10.3)
Chloride: 107 mmol/L (ref 98–111)
Creatinine, Ser: 0.82 mg/dL (ref 0.44–1.00)
GFR, Estimated: >60 mL/min (ref >60)
Glucose, Bld: 96 mg/dL (ref 70–99)
Potassium: 4 mmol/L (ref 3.5–5.1)
Sodium: 142 mmol/L (ref 135–145)
Total Bilirubin: 0.3 mg/dL (ref 0.3–1.2)
Total Protein: 6.2 g/dL — ABNORMAL LOW (ref 6.5–8.1)

## 2021-12-09 LAB — CBC WITH DIFFERENTIAL/PLATELET
Abs Immature Granulocytes: 0.02 10*3/uL (ref 0.00–0.07)
Basophils Absolute: 0 10*3/uL (ref 0.0–0.1)
Basophils Relative: 0 %
Eosinophils Absolute: 0 10*3/uL (ref 0.0–0.5)
Eosinophils Relative: 0 %
HCT: 33.3 % — ABNORMAL LOW (ref 36.0–46.0)
Hemoglobin: 10.9 g/dL — ABNORMAL LOW (ref 12.0–15.0)
Immature Granulocytes: 0 %
Lymphocytes Relative: 17 %
Lymphs Abs: 0.8 10*3/uL (ref 0.7–4.0)
MCH: 32.5 pg (ref 26.0–34.0)
MCHC: 32.7 g/dL (ref 30.0–36.0)
MCV: 99.4 fL (ref 80.0–100.0)
Monocytes Absolute: 0.6 10*3/uL (ref 0.1–1.0)
Monocytes Relative: 11 %
Neutro Abs: 3.5 10*3/uL (ref 1.7–7.7)
Neutrophils Relative %: 72 %
Platelets: 232 10*3/uL (ref 150–400)
RBC: 3.35 MIL/uL — ABNORMAL LOW (ref 3.87–5.11)
RDW: 13.1 % (ref 11.5–15.5)
WBC: 4.9 10*3/uL (ref 4.0–10.5)
nRBC: 0 % (ref 0.0–0.2)

## 2021-12-09 MED ORDER — SODIUM CHLORIDE 0.9 % IV SOLN: Freq: Once | INTRAVENOUS | Status: AC

## 2021-12-09 MED ORDER — SODIUM CHLORIDE 0.9% FLUSH
10.0000 mL | Freq: Once | INTRAVENOUS | Status: AC
  Administered 2021-12-09: 10 mL

## 2021-12-09 MED ORDER — DEXTROSE 5 % IV SOLN
60.0000 mg | Freq: Once | INTRAVENOUS | Status: DC
  Filled 2021-12-09: qty 30

## 2021-12-09 MED ORDER — HEPARIN SOD (PORK) LOCK FLUSH 100 UNIT/ML IV SOLN
500.0000 [IU] | Freq: Once | INTRAVENOUS | Status: AC | PRN
  Administered 2021-12-09: 500 [IU]

## 2021-12-09 MED ORDER — DEXTROSE 5 % IV SOLN
60.0000 mg | Freq: Once | INTRAVENOUS | Status: AC
  Administered 2021-12-09: 60 mg via INTRAVENOUS
  Filled 2021-12-09: qty 30

## 2021-12-09 MED ORDER — SODIUM CHLORIDE 0.9% FLUSH
10.0000 mL | INTRAVENOUS | Status: DC | PRN
  Administered 2021-12-09: 10 mL

## 2021-12-09 NOTE — Patient Instructions (Signed)
[facility]  Discharge Instructions: Thank you for choosing [facility] to provide your oncology and hematology care.   If you have a lab appointment with the Cancer Center, please go directly to the Cancer Center and check in at the registration area.   Wear comfortable clothing and clothing appropriate for easy access to any Portacath or PICC line.   We strive to give you quality time with your provider. You may need to reschedule your appointment if you arrive late (15 or more minutes).  Arriving late affects you and other patients whose appointments are after yours.  Also, if you miss three or more appointments without notifying the office, you may be dismissed from the clinic at the provider's discretion.      For prescription refill requests, have your pharmacy contact our office and allow 72 hours for refills to be completed.    Today you received the following chemotherapy and/or immunotherapy agents kyprolis      To help prevent nausea and vomiting after your treatment, we encourage you to take your nausea medication as directed.  BELOW ARE SYMPTOMS THAT SHOULD BE REPORTED IMMEDIATELY: . *FEVER GREATER THAN 100.4 F (38 C) OR HIGHER . *CHILLS OR SWEATING . *NAUSEA AND VOMITING THAT IS NOT CONTROLLED WITH YOUR NAUSEA MEDICATION . *UNUSUAL SHORTNESS OF BREATH . *UNUSUAL BRUISING OR BLEEDING . *URINARY PROBLEMS (pain or burning when urinating, or frequent urination) . *BOWEL PROBLEMS (unusual diarrhea, constipation, pain near the anus) . TENDERNESS IN MOUTH AND THROAT WITH OR WITHOUT PRESENCE OF ULCERS (sore throat, sores in mouth, or a toothache) . UNUSUAL RASH, SWELLING OR PAIN  . UNUSUAL VAGINAL DISCHARGE OR ITCHING   Items with * indicate a potential emergency and should be followed up as soon as possible or go to the Emergency Department if any problems should occur.  Please show the CHEMOTHERAPY ALERT CARD or IMMUNOTHERAPY ALERT  CARD at check-in to the Emergency Department and triage nurse.  Should you have questions after your visit or need to cancel or reschedule your appointment, please contact [facility]  Dept: [phone]  and follow the prompts.  Office hours are 8:00 a.m. to 4:30 p.m. Monday - Friday. Please note that voicemails left after 4:00 p.m. may not be returned until the following business day.  We are closed weekends and major holidays. You have access to a nurse at all times for urgent questions. Please call the main number to the clinic Dept: [phone] and follow the prompts.   For any non-urgent questions, you may also contact your provider using MyChart. We now offer e-Visits for anyone 18 and older to request care online for non-urgent symptoms. For details visit [email or web address].   Also download the MyChart app! Go to the app store, search "MyChart", open the app, select [facility], and log in with your MyChart username and password.  Due to Covid, a mask is required upon entering the hospital/clinic. If you do not have a mask, one will be given to you upon arrival. For doctor visits, patients may have 1 support Jean Davidson aged 76 or older with them. For treatment visits, patients cannot have anyone with them due to current Covid guidelines and our immunocompromised population.   

## 2021-12-09 NOTE — Progress Notes (Signed)
Patient reports she has taken her PO steroids at home prior to arrival.

## 2021-12-12 LAB — KAPPA/LAMBDA LIGHT CHAINS
Kappa free light chain: 9 mg/L (ref 3.3–19.4)
Kappa, lambda light chain ratio: 0.08 — ABNORMAL LOW (ref 0.26–1.65)
Lambda free light chains: 106.9 mg/L — ABNORMAL HIGH (ref 5.7–26.3)

## 2021-12-14 LAB — MULTIPLE MYELOMA PANEL, SERUM
Albumin SerPl Elph-Mcnc: 3.3 g/dL (ref 2.9–4.4)
Albumin/Glob SerPl: 1.5 (ref 0.7–1.7)
Alpha 1: 0.2 g/dL (ref 0.0–0.4)
Alpha2 Glob SerPl Elph-Mcnc: 0.9 g/dL (ref 0.4–1.0)
B-Globulin SerPl Elph-Mcnc: 0.7 g/dL (ref 0.7–1.3)
Gamma Glob SerPl Elph-Mcnc: 0.6 g/dL (ref 0.4–1.8)
Globulin, Total: 2.3 g/dL (ref 2.2–3.9)
IgA: 245 mg/dL (ref 64–422)
IgG (Immunoglobin G), Serum: 330 mg/dL — ABNORMAL LOW (ref 586–1602)
IgM (Immunoglobulin M), Srm: 21 mg/dL — ABNORMAL LOW (ref 26–217)
M Protein SerPl Elph-Mcnc: 0.3 g/dL — ABNORMAL HIGH
Total Protein ELP: 5.6 g/dL — ABNORMAL LOW (ref 6.0–8.5)

## 2021-12-16 ENCOUNTER — Inpatient Hospital Stay: Payer: Medicare HMO

## 2021-12-16 ENCOUNTER — Encounter: Payer: Self-pay | Admitting: Hematology and Oncology

## 2021-12-16 ENCOUNTER — Other Ambulatory Visit: Payer: Self-pay

## 2021-12-16 ENCOUNTER — Inpatient Hospital Stay (HOSPITAL_BASED_OUTPATIENT_CLINIC_OR_DEPARTMENT_OTHER): Payer: Medicare HMO | Admitting: Hematology and Oncology

## 2021-12-16 VITALS — BP 137/66 | HR 72 | Temp 97.7°F | Resp 18

## 2021-12-16 DIAGNOSIS — D469 Myelodysplastic syndrome, unspecified: Secondary | ICD-10-CM

## 2021-12-16 DIAGNOSIS — Z5111 Encounter for antineoplastic chemotherapy: Secondary | ICD-10-CM | POA: Diagnosis not present

## 2021-12-16 DIAGNOSIS — C9002 Multiple myeloma in relapse: Secondary | ICD-10-CM

## 2021-12-16 DIAGNOSIS — D6481 Anemia due to antineoplastic chemotherapy: Secondary | ICD-10-CM | POA: Diagnosis not present

## 2021-12-16 DIAGNOSIS — G4700 Insomnia, unspecified: Secondary | ICD-10-CM

## 2021-12-16 DIAGNOSIS — T451X5A Adverse effect of antineoplastic and immunosuppressive drugs, initial encounter: Secondary | ICD-10-CM | POA: Diagnosis not present

## 2021-12-16 LAB — CBC WITH DIFFERENTIAL/PLATELET
Abs Immature Granulocytes: 0.02 10*3/uL (ref 0.00–0.07)
Basophils Absolute: 0 10*3/uL (ref 0.0–0.1)
Basophils Relative: 0 %
Eosinophils Absolute: 0.2 10*3/uL (ref 0.0–0.5)
Eosinophils Relative: 3 %
HCT: 33.2 % — ABNORMAL LOW (ref 36.0–46.0)
Hemoglobin: 11 g/dL — ABNORMAL LOW (ref 12.0–15.0)
Immature Granulocytes: 0 %
Lymphocytes Relative: 18 %
Lymphs Abs: 1.1 10*3/uL (ref 0.7–4.0)
MCH: 33.2 pg (ref 26.0–34.0)
MCHC: 33.1 g/dL (ref 30.0–36.0)
MCV: 100.3 fL — ABNORMAL HIGH (ref 80.0–100.0)
Monocytes Absolute: 0.8 10*3/uL (ref 0.1–1.0)
Monocytes Relative: 12 %
Neutro Abs: 4.3 10*3/uL (ref 1.7–7.7)
Neutrophils Relative %: 67 %
Platelets: 160 10*3/uL (ref 150–400)
RBC: 3.31 MIL/uL — ABNORMAL LOW (ref 3.87–5.11)
RDW: 13.2 % (ref 11.5–15.5)
WBC: 6.5 10*3/uL (ref 4.0–10.5)
nRBC: 0 % (ref 0.0–0.2)

## 2021-12-16 LAB — COMPREHENSIVE METABOLIC PANEL
ALT: 9 U/L (ref 0–44)
AST: 12 U/L — ABNORMAL LOW (ref 15–41)
Albumin: 3.8 g/dL (ref 3.5–5.0)
Alkaline Phosphatase: 66 U/L (ref 38–126)
Anion gap: 6 (ref 5–15)
BUN: 27 mg/dL — ABNORMAL HIGH (ref 8–23)
CO2: 27 mmol/L (ref 22–32)
Calcium: 8.9 mg/dL (ref 8.9–10.3)
Chloride: 108 mmol/L (ref 98–111)
Creatinine, Ser: 0.78 mg/dL (ref 0.44–1.00)
GFR, Estimated: >60 mL/min (ref >60)
Glucose, Bld: 94 mg/dL (ref 70–99)
Potassium: 4.2 mmol/L (ref 3.5–5.1)
Sodium: 141 mmol/L (ref 135–145)
Total Bilirubin: 0.3 mg/dL (ref 0.3–1.2)
Total Protein: 6.1 g/dL — ABNORMAL LOW (ref 6.5–8.1)

## 2021-12-16 MED ORDER — SODIUM CHLORIDE 0.9% FLUSH
10.0000 mL | INTRAVENOUS | Status: DC | PRN
  Administered 2021-12-16: 10 mL

## 2021-12-16 MED ORDER — SODIUM CHLORIDE 0.9 % IV SOLN: Freq: Once | INTRAVENOUS | Status: AC

## 2021-12-16 MED ORDER — SODIUM CHLORIDE 0.9% FLUSH
10.0000 mL | Freq: Once | INTRAVENOUS | Status: AC
  Administered 2021-12-16: 10 mL

## 2021-12-16 MED ORDER — DEXAMETHASONE 4 MG PO TABS: 4.0000 mg | ORAL_TABLET | ORAL | 5 refills | Status: DC

## 2021-12-16 MED ORDER — HEPARIN SOD (PORK) LOCK FLUSH 100 UNIT/ML IV SOLN
500.0000 [IU] | Freq: Once | INTRAVENOUS | Status: AC | PRN
  Administered 2021-12-16: 500 [IU]

## 2021-12-16 MED ORDER — MIRTAZAPINE 7.5 MG PO TABS: 7.5000 mg | ORAL_TABLET | Freq: Every day | ORAL | 1 refills | Status: DC

## 2021-12-16 MED ORDER — DEXTROSE 5 % IV SOLN
60.0000 mg | Freq: Once | INTRAVENOUS | Status: AC
  Administered 2021-12-16: 60 mg via INTRAVENOUS
  Filled 2021-12-16: qty 30

## 2021-12-16 NOTE — Assessment & Plan Note (Signed)
This is likely due to recent treatment. The patient denies recent history of bleeding such as epistaxis, hematuria or hematochezia. She is asymptomatic from the anemia. I will observe for now.  She does not require transfusion now. I will continue the chemotherapy at current dose without dosage adjustment.  If the anemia gets progressive worse in the future, I might have to delay her treatment or adjust the chemotherapy dose.  

## 2021-12-16 NOTE — Assessment & Plan Note (Signed)
Recent light chain was elevated but could be related to recent infection So far, she tolerated 36 mg per metered square of kyprolis She has mild pancytopenia but overall asymptomatic However, if her light chains start to trend up, we might consider increasing the dose of Kyprolis

## 2021-12-23 ENCOUNTER — Inpatient Hospital Stay: Payer: Medicare HMO

## 2021-12-23 ENCOUNTER — Other Ambulatory Visit: Payer: Self-pay

## 2021-12-23 VITALS — BP 142/68 | HR 86 | Temp 98.9°F | Resp 18 | Wt 163.5 lb

## 2021-12-23 DIAGNOSIS — D469 Myelodysplastic syndrome, unspecified: Secondary | ICD-10-CM

## 2021-12-23 DIAGNOSIS — Z5111 Encounter for antineoplastic chemotherapy: Secondary | ICD-10-CM | POA: Diagnosis not present

## 2021-12-23 DIAGNOSIS — C9002 Multiple myeloma in relapse: Secondary | ICD-10-CM

## 2021-12-23 LAB — COMPREHENSIVE METABOLIC PANEL
ALT: 8 U/L (ref 0–44)
AST: 13 U/L — ABNORMAL LOW (ref 15–41)
Albumin: 3.8 g/dL (ref 3.5–5.0)
Alkaline Phosphatase: 60 U/L (ref 38–126)
Anion gap: 6 (ref 5–15)
BUN: 16 mg/dL (ref 8–23)
CO2: 28 mmol/L (ref 22–32)
Calcium: 9 mg/dL (ref 8.9–10.3)
Chloride: 107 mmol/L (ref 98–111)
Creatinine, Ser: 0.73 mg/dL (ref 0.44–1.00)
GFR, Estimated: >60 mL/min (ref >60)
Glucose, Bld: 103 mg/dL — ABNORMAL HIGH (ref 70–99)
Potassium: 3.8 mmol/L (ref 3.5–5.1)
Sodium: 141 mmol/L (ref 135–145)
Total Bilirubin: 0.3 mg/dL (ref 0.3–1.2)
Total Protein: 6.5 g/dL (ref 6.5–8.1)

## 2021-12-23 LAB — CBC WITH DIFFERENTIAL/PLATELET
Abs Immature Granulocytes: 0.01 10*3/uL (ref 0.00–0.07)
Basophils Absolute: 0 10*3/uL (ref 0.0–0.1)
Basophils Relative: 0 %
Eosinophils Absolute: 0 10*3/uL (ref 0.0–0.5)
Eosinophils Relative: 0 %
HCT: 32.6 % — ABNORMAL LOW (ref 36.0–46.0)
Hemoglobin: 10.7 g/dL — ABNORMAL LOW (ref 12.0–15.0)
Immature Granulocytes: 0 %
Lymphocytes Relative: 20 %
Lymphs Abs: 1 10*3/uL (ref 0.7–4.0)
MCH: 33 pg (ref 26.0–34.0)
MCHC: 32.8 g/dL (ref 30.0–36.0)
MCV: 100.6 fL — ABNORMAL HIGH (ref 80.0–100.0)
Monocytes Absolute: 0.6 10*3/uL (ref 0.1–1.0)
Monocytes Relative: 12 %
Neutro Abs: 3.6 10*3/uL (ref 1.7–7.7)
Neutrophils Relative %: 68 %
Platelets: 160 10*3/uL (ref 150–400)
RBC: 3.24 MIL/uL — ABNORMAL LOW (ref 3.87–5.11)
RDW: 13.4 % (ref 11.5–15.5)
WBC: 5.3 10*3/uL (ref 4.0–10.5)
nRBC: 0 % (ref 0.0–0.2)

## 2021-12-23 MED ORDER — HEPARIN SOD (PORK) LOCK FLUSH 100 UNIT/ML IV SOLN
500.0000 [IU] | Freq: Once | INTRAVENOUS | Status: AC | PRN
  Administered 2021-12-23: 500 [IU]

## 2021-12-23 MED ORDER — SODIUM CHLORIDE 0.9% FLUSH
10.0000 mL | Freq: Once | INTRAVENOUS | Status: AC
  Administered 2021-12-23: 10 mL

## 2021-12-23 MED ORDER — DEXTROSE 5 % IV SOLN
60.0000 mg | Freq: Once | INTRAVENOUS | Status: AC
  Administered 2021-12-23: 60 mg via INTRAVENOUS
  Filled 2021-12-23: qty 30

## 2021-12-23 MED ORDER — SODIUM CHLORIDE 0.9% FLUSH
10.0000 mL | INTRAVENOUS | Status: DC | PRN
  Administered 2021-12-23: 10 mL

## 2021-12-23 MED ORDER — SODIUM CHLORIDE 0.9 % IV SOLN: Freq: Once | INTRAVENOUS | Status: AC

## 2021-12-23 NOTE — Patient Instructions (Addendum)
[facility]COL[facility]   Discharge Instructions: Thank you for choosing [facility] to provide your oncology and hematology care.   If you have a lab appointment with the [facility], please go directly to the [facility] and check in at the registration area.   Wear comfortable clothing and clothing appropriate for easy access to any Portacath or PICC line.   We strive to give you quality time with your provider. You may need to reschedule your appointment if you arrive late (15 or more minutes).  Arriving late affects you and other patients whose appointments are after yours.  Also, if you miss three or more appointments without notifying the office, you may be dismissed from the clinic at the provider's discretion.      For prescription refill requests, have your pharmacy contact our office and allow 72 hours for refills to be completed.    Today you received the following chemotherapy and/or immunotherapy agents: Kyprolis      To help prevent nausea and vomiting after your treatment, we encourage you to take your nausea medication as directed.  BELOW ARE SYMPTOMS THAT SHOULD BE REPORTED IMMEDIATELY: *FEVER GREATER THAN 100.4 F (38 C) OR HIGHER *CHILLS OR SWEATING *NAUSEA AND VOMITING THAT IS NOT CONTROLLED WITH YOUR NAUSEA MEDICATION *UNUSUAL SHORTNESS OF BREATH *UNUSUAL BRUISING OR BLEEDING *URINARY PROBLEMS (pain or burning when urinating, or frequent urination) *BOWEL PROBLEMS (unusual diarrhea, constipation, pain near the anus) TENDERNESS IN MOUTH AND THROAT WITH OR WITHOUT PRESENCE OF ULCERS (sore throat, sores in mouth, or a toothache) UNUSUAL RASH, SWELLING OR PAIN  UNUSUAL VAGINAL DISCHARGE OR ITCHING   Items with * indicate a potential emergency and should be followed up as soon as possible or go to the Emergency Department if any problems should occur.  Please show the CHEMOTHERAPY ALERT CARD or IMMUNOTHERAPY ALERT CARD at check-in to  the Emergency Department and triage nurse.  Should you have questions after your visit or need to cancel or reschedule your appointment, please contact [facility]  Dept: [phone]  and follow the prompts.  Office hours are 8:00 a.m. to 4:30 p.m. Monday - Friday. Please note that voicemails left after 4:00 p.m. may not be returned until the following business day.  We are closed weekends and major holidays. You have access to a nurse at all times for urgent questions. Please call the main number to the clinic Dept: [phone] and follow the prompts.   For any non-urgent questions, you may also contact your provider using MyChart. We now offer e-Visits for anyone 49 and older to request care online for non-urgent symptoms. For details visit [email or web address].   Also download the MyChart app! Go to the app store, search "MyChart", open the app, select [facility], and log in with your MyChart username and password.  Masks are optional in the cancer centers. If you would like for your care team to wear a mask while they are taking care of you, please let them know. For doctor visits, patients may have with them one support person who is at least 76 years old. At this time, visitors are not allowed in the infusion area.

## 2022-01-02 ENCOUNTER — Ambulatory Visit: Payer: Medicare HMO | Admitting: Physician Assistant

## 2022-01-06 ENCOUNTER — Inpatient Hospital Stay: Payer: Medicare HMO | Attending: Hematology and Oncology

## 2022-01-06 ENCOUNTER — Inpatient Hospital Stay: Payer: Medicare HMO

## 2022-01-06 ENCOUNTER — Other Ambulatory Visit: Payer: Self-pay

## 2022-01-06 VITALS — BP 156/88 | HR 69 | Temp 97.7°F | Resp 17 | Wt 167.5 lb

## 2022-01-06 DIAGNOSIS — D6481 Anemia due to antineoplastic chemotherapy: Secondary | ICD-10-CM | POA: Diagnosis not present

## 2022-01-06 DIAGNOSIS — D61818 Other pancytopenia: Secondary | ICD-10-CM | POA: Diagnosis not present

## 2022-01-06 DIAGNOSIS — Z5111 Encounter for antineoplastic chemotherapy: Secondary | ICD-10-CM | POA: Insufficient documentation

## 2022-01-06 DIAGNOSIS — D469 Myelodysplastic syndrome, unspecified: Secondary | ICD-10-CM

## 2022-01-06 DIAGNOSIS — C9002 Multiple myeloma in relapse: Secondary | ICD-10-CM

## 2022-01-06 LAB — CBC WITH DIFFERENTIAL/PLATELET
Abs Immature Granulocytes: 0.01 10*3/uL (ref 0.00–0.07)
Basophils Absolute: 0 10*3/uL (ref 0.0–0.1)
Basophils Relative: 0 %
Eosinophils Absolute: 0 10*3/uL (ref 0.0–0.5)
Eosinophils Relative: 0 %
HCT: 32.5 % — ABNORMAL LOW (ref 36.0–46.0)
Hemoglobin: 10.5 g/dL — ABNORMAL LOW (ref 12.0–15.0)
Immature Granulocytes: 0 %
Lymphocytes Relative: 24 %
Lymphs Abs: 1.1 10*3/uL (ref 0.7–4.0)
MCH: 33.1 pg (ref 26.0–34.0)
MCHC: 32.3 g/dL (ref 30.0–36.0)
MCV: 102.5 fL — ABNORMAL HIGH (ref 80.0–100.0)
Monocytes Absolute: 0.5 10*3/uL (ref 0.1–1.0)
Monocytes Relative: 11 %
Neutro Abs: 2.9 10*3/uL (ref 1.7–7.7)
Neutrophils Relative %: 65 %
Platelets: 195 10*3/uL (ref 150–400)
RBC: 3.17 MIL/uL — ABNORMAL LOW (ref 3.87–5.11)
RDW: 13.6 % (ref 11.5–15.5)
WBC: 4.5 10*3/uL (ref 4.0–10.5)
nRBC: 0 % (ref 0.0–0.2)

## 2022-01-06 LAB — COMPREHENSIVE METABOLIC PANEL
ALT: 8 U/L (ref 0–44)
AST: 12 U/L — ABNORMAL LOW (ref 15–41)
Albumin: 3.8 g/dL (ref 3.5–5.0)
Alkaline Phosphatase: 60 U/L (ref 38–126)
Anion gap: 6 (ref 5–15)
BUN: 24 mg/dL — ABNORMAL HIGH (ref 8–23)
CO2: 27 mmol/L (ref 22–32)
Calcium: 8.9 mg/dL (ref 8.9–10.3)
Chloride: 108 mmol/L (ref 98–111)
Creatinine, Ser: 0.74 mg/dL (ref 0.44–1.00)
GFR, Estimated: >60 mL/min (ref >60)
Glucose, Bld: 87 mg/dL (ref 70–99)
Potassium: 4.2 mmol/L (ref 3.5–5.1)
Sodium: 141 mmol/L (ref 135–145)
Total Bilirubin: 0.3 mg/dL (ref 0.3–1.2)
Total Protein: 6.2 g/dL — ABNORMAL LOW (ref 6.5–8.1)

## 2022-01-06 MED ORDER — SODIUM CHLORIDE 0.9% FLUSH
10.0000 mL | INTRAVENOUS | Status: DC | PRN
  Administered 2022-01-06: 10 mL

## 2022-01-06 MED ORDER — DEXTROSE 5 % IV SOLN
60.0000 mg | Freq: Once | INTRAVENOUS | Status: AC
  Administered 2022-01-06: 60 mg via INTRAVENOUS
  Filled 2022-01-06: qty 30

## 2022-01-06 MED ORDER — HEPARIN SOD (PORK) LOCK FLUSH 100 UNIT/ML IV SOLN
500.0000 [IU] | Freq: Once | INTRAVENOUS | Status: AC | PRN
  Administered 2022-01-06: 500 [IU]

## 2022-01-06 MED ORDER — SODIUM CHLORIDE 0.9% FLUSH
10.0000 mL | Freq: Once | INTRAVENOUS | Status: AC
  Administered 2022-01-06: 10 mL

## 2022-01-06 MED ORDER — SODIUM CHLORIDE 0.9 % IV SOLN: Freq: Once | INTRAVENOUS | Status: DC

## 2022-01-06 MED ORDER — SODIUM CHLORIDE 0.9 % IV SOLN: Freq: Once | INTRAVENOUS | Status: AC

## 2022-01-06 NOTE — Patient Instructions (Signed)
[facility]COL[facility]  Discharge Instructions: Thank you for choosing [facility] to provide your oncology and hematology care.   If you have a lab appointment with the [facility], please go directly to the [facility] and check in at the registration area.   Wear comfortable clothing and clothing appropriate for easy access to any Portacath or PICC line.   We strive to give you quality time with your provider. You may need to reschedule your appointment if you arrive late (15 or more minutes).  Arriving late affects you and other patients whose appointments are after yours.  Also, if you miss three or more appointments without notifying the office, you may be dismissed from the clinic at the provider's discretion.      For prescription refill requests, have your pharmacy contact our office and allow 72 hours for refills to be completed.    Today you received the following chemotherapy and/or immunotherapy agents: Kyprolis      To help prevent nausea and vomiting after your treatment, we encourage you to take your nausea medication as directed.  BELOW ARE SYMPTOMS THAT SHOULD BE REPORTED IMMEDIATELY: *FEVER GREATER THAN 100.4 F (38 C) OR HIGHER *CHILLS OR SWEATING *NAUSEA AND VOMITING THAT IS NOT CONTROLLED WITH YOUR NAUSEA MEDICATION *UNUSUAL SHORTNESS OF BREATH *UNUSUAL BRUISING OR BLEEDING *URINARY PROBLEMS (pain or burning when urinating, or frequent urination) *BOWEL PROBLEMS (unusual diarrhea, constipation, pain near the anus) TENDERNESS IN MOUTH AND THROAT WITH OR WITHOUT PRESENCE OF ULCERS (sore throat, sores in mouth, or a toothache) UNUSUAL RASH, SWELLING OR PAIN  UNUSUAL VAGINAL DISCHARGE OR ITCHING   Items with * indicate a potential emergency and should be followed up as soon as possible or go to the Emergency Department if any problems should occur.  Please show the CHEMOTHERAPY ALERT CARD or IMMUNOTHERAPY ALERT CARD at check-in to  the Emergency Department and triage nurse.  Should you have questions after your visit or need to cancel or reschedule your appointment, please contact [facility]  Dept: [phone]  and follow the prompts.  Office hours are 8:00 a.m. to 4:30 p.m. Monday - Friday. Please note that voicemails left after 4:00 p.m. may not be returned until the following business day.  We are closed weekends and major holidays. You have access to a nurse at all times for urgent questions. Please call the main number to the clinic Dept: [phone] and follow the prompts.   For any non-urgent questions, you may also contact your provider using MyChart. We now offer e-Visits for anyone 73 and older to request care online for non-urgent symptoms. For details visit [email or web address].   Also download the MyChart app! Go to the app store, search "MyChart", open the app, select [facility], and log in with your MyChart username and password.  Masks are optional in the cancer centers. If you would like for your care team to wear a mask while they are taking care of you, please let them know. For doctor visits, patients may have with them one support person who is at least 76 years old. At this time, visitors are not allowed in the infusion area.

## 2022-01-06 NOTE — Progress Notes (Signed)
Pt reports she took her dex before her appt this morning

## 2022-01-09 LAB — KAPPA/LAMBDA LIGHT CHAINS
Kappa free light chain: 10 mg/L (ref 3.3–19.4)
Kappa, lambda light chain ratio: 0.09 — ABNORMAL LOW (ref 0.26–1.65)
Lambda free light chains: 113.1 mg/L — ABNORMAL HIGH (ref 5.7–26.3)

## 2022-01-12 LAB — MULTIPLE MYELOMA PANEL, SERUM
Albumin SerPl Elph-Mcnc: 3.3 g/dL (ref 2.9–4.4)
Albumin/Glob SerPl: 1.4 (ref 0.7–1.7)
Alpha 1: 0.2 g/dL (ref 0.0–0.4)
Alpha2 Glob SerPl Elph-Mcnc: 0.9 g/dL (ref 0.4–1.0)
B-Globulin SerPl Elph-Mcnc: 0.7 g/dL (ref 0.7–1.3)
Gamma Glob SerPl Elph-Mcnc: 0.6 g/dL (ref 0.4–1.8)
Globulin, Total: 2.5 g/dL (ref 2.2–3.9)
IgA: 298 mg/dL (ref 64–422)
IgG (Immunoglobin G), Serum: 328 mg/dL — ABNORMAL LOW (ref 586–1602)
IgM (Immunoglobulin M), Srm: 23 mg/dL — ABNORMAL LOW (ref 26–217)
M Protein SerPl Elph-Mcnc: 0.3 g/dL — ABNORMAL HIGH
Total Protein ELP: 5.8 g/dL — ABNORMAL LOW (ref 6.0–8.5)

## 2022-01-13 ENCOUNTER — Inpatient Hospital Stay: Payer: Medicare HMO

## 2022-01-13 ENCOUNTER — Other Ambulatory Visit: Payer: Self-pay

## 2022-01-13 ENCOUNTER — Inpatient Hospital Stay (HOSPITAL_BASED_OUTPATIENT_CLINIC_OR_DEPARTMENT_OTHER): Payer: Medicare HMO | Admitting: Hematology and Oncology

## 2022-01-13 ENCOUNTER — Encounter: Payer: Self-pay | Admitting: Hematology and Oncology

## 2022-01-13 VITALS — Resp 18

## 2022-01-13 DIAGNOSIS — T451X5A Adverse effect of antineoplastic and immunosuppressive drugs, initial encounter: Secondary | ICD-10-CM | POA: Diagnosis not present

## 2022-01-13 DIAGNOSIS — C9002 Multiple myeloma in relapse: Secondary | ICD-10-CM

## 2022-01-13 DIAGNOSIS — D6481 Anemia due to antineoplastic chemotherapy: Secondary | ICD-10-CM

## 2022-01-13 DIAGNOSIS — Z5111 Encounter for antineoplastic chemotherapy: Secondary | ICD-10-CM | POA: Diagnosis not present

## 2022-01-13 DIAGNOSIS — C946 Myelodysplastic disease, not classified: Secondary | ICD-10-CM

## 2022-01-13 DIAGNOSIS — D469 Myelodysplastic syndrome, unspecified: Secondary | ICD-10-CM

## 2022-01-13 LAB — CBC WITH DIFFERENTIAL/PLATELET
Abs Immature Granulocytes: 0 10*3/uL (ref 0.00–0.07)
Basophils Absolute: 0 10*3/uL (ref 0.0–0.1)
Basophils Relative: 0 %
Eosinophils Absolute: 0 10*3/uL (ref 0.0–0.5)
Eosinophils Relative: 0 %
HCT: 33.2 % — ABNORMAL LOW (ref 36.0–46.0)
Hemoglobin: 10.7 g/dL — ABNORMAL LOW (ref 12.0–15.0)
Immature Granulocytes: 0 %
Lymphocytes Relative: 21 %
Lymphs Abs: 1 10*3/uL (ref 0.7–4.0)
MCH: 32.7 pg (ref 26.0–34.0)
MCHC: 32.2 g/dL (ref 30.0–36.0)
MCV: 101.5 fL — ABNORMAL HIGH (ref 80.0–100.0)
Monocytes Absolute: 0.6 10*3/uL (ref 0.1–1.0)
Monocytes Relative: 12 %
Neutro Abs: 3.3 10*3/uL (ref 1.7–7.7)
Neutrophils Relative %: 67 %
Platelets: 164 10*3/uL (ref 150–400)
RBC: 3.27 MIL/uL — ABNORMAL LOW (ref 3.87–5.11)
RDW: 13.8 % (ref 11.5–15.5)
WBC: 5 10*3/uL (ref 4.0–10.5)
nRBC: 0 % (ref 0.0–0.2)

## 2022-01-13 LAB — COMPREHENSIVE METABOLIC PANEL
ALT: 9 U/L (ref 0–44)
AST: 13 U/L — ABNORMAL LOW (ref 15–41)
Albumin: 3.8 g/dL (ref 3.5–5.0)
Alkaline Phosphatase: 56 U/L (ref 38–126)
Anion gap: 5 (ref 5–15)
BUN: 19 mg/dL (ref 8–23)
CO2: 29 mmol/L (ref 22–32)
Calcium: 9.2 mg/dL (ref 8.9–10.3)
Chloride: 106 mmol/L (ref 98–111)
Creatinine, Ser: 0.7 mg/dL (ref 0.44–1.00)
GFR, Estimated: >60 mL/min (ref >60)
Glucose, Bld: 96 mg/dL (ref 70–99)
Potassium: 4.1 mmol/L (ref 3.5–5.1)
Sodium: 140 mmol/L (ref 135–145)
Total Bilirubin: 0.3 mg/dL (ref 0.3–1.2)
Total Protein: 6.1 g/dL — ABNORMAL LOW (ref 6.5–8.1)

## 2022-01-13 MED ORDER — SODIUM CHLORIDE 0.9% FLUSH
10.0000 mL | INTRAVENOUS | Status: DC | PRN
  Administered 2022-01-13: 10 mL

## 2022-01-13 MED ORDER — DEXTROSE 5 % IV SOLN
34.0000 mg/m2 | Freq: Once | INTRAVENOUS | Status: AC
  Administered 2022-01-13: 60 mg via INTRAVENOUS
  Filled 2022-01-13: qty 30

## 2022-01-13 MED ORDER — SODIUM CHLORIDE 0.9% FLUSH
10.0000 mL | Freq: Once | INTRAVENOUS | Status: AC
  Administered 2022-01-13: 10 mL

## 2022-01-13 MED ORDER — SODIUM CHLORIDE 0.9 % IV SOLN: Freq: Once | INTRAVENOUS | Status: AC

## 2022-01-13 NOTE — Assessment & Plan Note (Signed)
This is likely due to recent treatment. The patient denies recent history of bleeding such as epistaxis, hematuria or hematochezia. She is asymptomatic from the anemia. I will observe for now.  She does not require transfusion now. I will continue the chemotherapy at current dose without dosage adjustment.  If the anemia gets progressive worse in the future, I might have to delay her treatment or adjust the chemotherapy dose.  

## 2022-01-13 NOTE — Progress Notes (Signed)
[place] OFFICE PROGRESS NOTE  Patient Care Team: [name], [name], MD as PCP - General (Internal Medicine) [name], MD as PCP - Cardiology (Cardiology) [name], MD as Referring Physician (Hematology and Oncology) [name], MD as Consulting Physician (Internal Medicine) [name], [name], MD as Consulting Physician (Infectious Diseases) [name], MD as Consulting Physician (Hematology and Oncology) [name], NP as Nurse Practitioner (Hematology and Oncology) [name], MD as Consulting Physician (Hematology and Oncology)  ASSESSMENT & PLAN:  Multiple myeloma in relapse [facility]) Recent light chain was elevated but overall, the ratio is not much different compared to last month' results So far, she tolerated 36 mg per metered square of kyprolis She has mild pancytopenia but overall asymptomatic However, if her light chains start to trend up, we might consider increasing the dose of Kyprolis   Anemia due to antineoplastic chemotherapy This is likely due to recent treatment. The patient denies recent history of bleeding such as epistaxis, hematuria or hematochezia. She is asymptomatic from the anemia. I will observe for now.  She does not require transfusion now. I will continue the chemotherapy at current dose without dosage adjustment.  If the anemia gets progressive worse in the future, I might have to delay her treatment or adjust the chemotherapy dose.   No orders of the defined types were placed in this encounter.   All questions were answered. The patient knows to call the clinic with any problems, questions or concerns. The total time spent in the appointment was 20 minutes encounter with patients including review of chart and various tests results, discussions about plan of care and coordination of care plan   [name], MD 01/13/2022 3:25 PM  INTERVAL HISTORY: Please see below for problem  oriented charting. she returns for treatment follow-up on maintenance treatment with carfilzomib She has concern over recent changes in her light chain level She denies recent infection Her energy level is fair She has appointment pending to go back to [facility] next month for evaluation  REVIEW OF SYSTEMS:   Constitutional: Denies fevers, chills or abnormal weight loss Eyes: Denies blurriness of vision Ears, nose, mouth, throat, and face: Denies mucositis or sore throat Respiratory: Denies cough, dyspnea or wheezes Cardiovascular: Denies palpitation, chest discomfort or lower extremity swelling Gastrointestinal:  Denies nausea, heartburn or change in bowel habits Skin: Denies abnormal skin rashes Lymphatics: Denies new lymphadenopathy or easy bruising Neurological:Denies numbness, tingling or new weaknesses Behavioral/Psych: Mood is stable, no new changes  All other systems were reviewed with the patient and are negative.  I have reviewed the past medical history, past surgical history, social history and family history with the patient and they are unchanged from previous note.  ALLERGIES:  has No Known Allergies.  MEDICATIONS:  Current Outpatient Medications  Medication Sig Dispense Refill   acyclovir (ZOVIRAX) 400 MG tablet TAKE 1 TABLET BY MOUTH TWICE A DAY 180 tablet 11   aspirin 325 MG tablet Take 325 mg by mouth daily.     calcium carbonate (TUMS - DOSED IN MG ELEMENTAL CALCIUM) 500 MG chewable tablet Chew 1 tablet by mouth daily.     Carfilzomib (KYPROLIS IV) Inject into the vein once a week.     Cholecalciferol 25 MCG (1000 UT) tablet Take 1,000 Units by mouth daily.     Cyanocobalamin (VITAMIN B12 PO) Take 1 tablet by mouth daily.     dexamethasone (DECADRON) 4 MG tablet Take 1 tablet (4  mg total) by mouth once a week. Take 1 tablet by mouth 1 hour before chemotherapy 5 tablet 5   loperamide (IMODIUM) 2 MG capsule Take by mouth as needed for diarrhea or loose stools. As needed  only     metoprolol tartrate (LOPRESSOR) 25 MG tablet TAKE 1 & 1/2 TABLET BY MOUTH TWICE A DAY 270 tablet 0   mirtazapine (REMERON) 7.5 MG tablet Take 1 tablet (7.5 mg total) by mouth at bedtime. 90 tablet 1   Multiple Vitamin (MULTIVITAMIN WITH MINERALS) TABS tablet Take 1 tablet by mouth daily.     Multiple Vitamins-Minerals (PRESERVISION AREDS 2 PO) Take 1 tablet by mouth 2 (two) times daily.     ondansetron (ZOFRAN) 8 MG tablet Take 1 tablet (8 mg total) by mouth every 8 (eight) hours as needed (Nausea or vomiting). 30 tablet 1   pantoprazole (PROTONIX) 40 MG tablet TAKE 1 TABLET BY MOUTH 2 TIMES DAILY. TAKE 30-60 MINUTES BEFORE BREAKFAST AND DINNER. 180 tablet 2   prochlorperazine (COMPAZINE) 10 MG tablet Take 1 tablet (10 mg total) by mouth every 6 (six) hours as needed (Nausea or vomiting). 30 tablet 1   No current facility-administered medications for this visit.   Facility-Administered Medications Ordered in Other Visits  Medication Dose Route Frequency Provider Last Rate Last Admin   sodium chloride flush (NS) 0.9 % injection 10 mL  10 mL Intracatheter PRN Alvy Bimler, Amalya Salmons, MD   10 mL at 01/13/22 1134    SUMMARY OF ONCOLOGIC HISTORY: Oncology History Overview Note  Multiple myeloma, Ig A Lambda, M spike 3.54 grams, Calcium 9.2, Creatinine 0.8, Beta 2 microglobulin 4.52, IgA 4840 mg/dL, lambda light chain 75.4, albumin 3.6, hemoglobin 9.7, platelet 115    Primary site: Multiple Myeloma   Staging method: AJCC 6th Edition   Clinical: Stage IIA signed by [name], MD on 11/07/2013  2:46 PM   Summary: Stage IIA S/p Allo transplant at [facility] on revlimid, pomalyst, daratumumab and velcade     Multiple myeloma in relapse ([place])  10/31/2013 Bone Marrow Biopsy   Bone marrow biopsy confirmed multiple myeloma with 40% bone marrow involvement. Skeletal survey showed minimal lesions in her score with generalized demineralization   11/10/2013 - 02/13/2014 Chemotherapy   The patient is  started on induction chemotherapy with weekly dexamethasone 40 mg by mouth as well as Velcade subcutaneous injection on days 1, 4, 8 and 11. On 11/21/2013, she was started on monthly Zometa.   12/23/2013 Adverse Reaction   The dose of Velcade was reduced due to thrombocytopenia.   01/28/2014 - 04/07/2014 Chemotherapy   Revlimid is added. Treatment was discontinued due to lack of response.   02/24/2014 - 04/07/2014 Chemotherapy   Due to worsening peripheral neuropathy, Velcade injection is changed to once a week. Revlimid was given 21 days on, 7 days off.   04/07/2014 - 04/10/2014 Chemotherapy   Revlimid was discontinued due to lack of response. Chemotherapy was changed back to Velcade injection twice a week, 2 weeks on 1 week off. Her treatment was switched to to minimum response   04/20/2014 - 06/02/2014 Chemotherapy   chemotherapy is switched to Carfilzomib, Cytoxan and dexamethasone.   04/22/2014 Procedure   she has placement of port for chemotherapy.   06/01/2014 Tumor Marker   Bloodwork show that she has greater than partial response   06/23/2014 Bone Marrow Biopsy   Bone marrow biopsy show 5-10% residual plasma cells, normal cytogenetics and FISH   07/07/2014 Procedure   She had stem  cell collection   07/22/2014 - 07/22/2014 Chemotherapy   She had high-dose chemotherapy with melphalan   07/23/2014 Bone Marrow Transplant   She had bone marrow transplant in autologous fashion at [facility]   10/20/2014 - 03/24/2015 Chemotherapy    she received chemotherapy with Kyprolis, Revlimid and dexamethasone   10/22/2014 Procedure   She has port placement   01/19/2015 Tumor Marker   IgA lambda M spike at 0.4 g    01/20/2015 Miscellaneous   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 No cource was found and fever resolved with IV vancomycin and meropenem   09/13/2015 Bone Marrow Biopsy   Bone marrow biopsy showed no increased blasts, 3-4 % plasma cells   03/02/2016 Bone Marrow Biopsy   Bone marrow biopsy at [facility] showed normocellular (30%) bone marrow with trilineage hematopoiesis. No significant increase in blasts. No significant increase in plasma cells.   05/12/2016 Imaging   DEXA scan at [place] showed osteopenia   10/24/2016 Imaging   Skeletal survey at [facility], no new lesions   12/07/2017 Imaging   No focal abnormality noted to suggest myeloma. Exam is stable from prior exam.   03/01/2018 Procedure   Successful 8 French right internal jugular vein power port placement with its tip at the SVC/RA junction.   03/06/2018 -  Chemotherapy   The patient had daratumumab    07/09/2018 Bone Marrow Biopsy   Bone marrow biopsy at [facility] showed residual disease at 0.004% plasma cells   07/03/2019 Bone Marrow Biopsy   A. Bone marrow, flow cytometric analysis for multiple myeloma minimal residual disease detection:   Negative. No phenotypically abnormal plasma cells at or above the limit of detection identified.     No monotypic B-cell population identified. Negative for increased blasts.   03/25/2021 Bone Marrow Biopsy   Repeat bone marrow biopsy at [place] showed female donor karyotype.  5 to 7% plasma cell is seen.  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   04/20/2021 Echocardiogram   1. Left ventricular ejection fraction, by estimation, is 60 to 65%. The left ventricle has normal function. The  left ventricle has no regional wall motion abnormalities. Left ventricular diastolic parameters were normal.  2. Right ventricular systolic function is normal. The right ventricular size is normal.  3. The mitral valve is normal in structure. No evidence of mitral valve regurgitation. No evidence of mitral stenosis.  4. The aortic valve is normal in structure. Aortic valve regurgitation is not visualized. No aortic stenosis is present.  5. The inferior vena cava is normal in size with greater than 50% respiratory variability, suggesting right atrial pressure of 3 mmHg.   04/29/2021 -  Chemotherapy   Patient is on Treatment Plan : MYELOMA RELAPSED/ REFRACTORY Carfilzomib D1,8,15 (20/27) + Dexamethasone (KPd) q28d     05/04/2021 PET scan   1. No evidence active multiple myeloma within the skeleton on  FDG PET scan. 2. No suspicious lytic lesion on CT portion exam. 3. No plasmacytoma.   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   10/31/2015 Miscellaneous   She has engrafted     PHYSICAL EXAMINATION: ECOG PERFORMANCE STATUS: 1 - Symptomatic but completely ambulatory  Vitals:   01/13/22 0923  BP: (!) 152/74  Pulse: 60  Temp: 97.8 F (36.6 C)  SpO2: 99%   Filed Weights   01/13/22 0923  Weight: 166 lb 6.4 oz (75.5 kg)    GENERAL:alert, no distress and comfortable  NEURO: alert & oriented x 3 with  fluent speech, no focal motor/sensory deficits  LABORATORY DATA:  I have reviewed the data as listed    Component Value Date/Time   NA 140 01/13/2022 0859   NA 142 01/04/2017 0902   K 4.1 01/13/2022 0859   K 3.9 01/04/2017 0902   CL 106 01/13/2022 0859   CO2 29 01/13/2022 0859   CO2 28 01/04/2017 0902   GLUCOSE 96 01/13/2022 0859   GLUCOSE 92 01/04/2017 0902   BUN 19 01/13/2022 0859   BUN 9.5 01/04/2017 0902   CREATININE 0.70 01/13/2022 0859   CREATININE 0.82 10/13/2021 0742   CREATININE 0.7 01/04/2017 0902   CALCIUM 9.2 01/13/2022 0859   CALCIUM 9.0 01/04/2017 0902   PROT 6.1 (L) 01/13/2022 0859   PROT 6.0 (L) 01/04/2017 0902   PROT 5.8 (L) 01/04/2017 0902   ALBUMIN 3.8 01/13/2022 0859   ALBUMIN 3.4 (L) 01/04/2017 0902   AST 13 (L) 01/13/2022 0859   AST 13 (L) 10/13/2021 0742   AST 21 01/04/2017 0902   ALT 9 01/13/2022 0859   ALT 9 10/13/2021 0742   ALT 16 01/04/2017 0902   ALKPHOS 56 01/13/2022 0859   ALKPHOS 101 01/04/2017 0902   BILITOT 0.3 01/13/2022 0859   BILITOT 0.3 10/13/2021 0742   BILITOT 0.56 01/04/2017 0902   GFRNONAA >60 01/13/2022 0859   GFRNONAA >60 10/13/2021 0742   GFRAA >60 03/25/2020 0917   GFRAA >60 12/18/2018 0901    No results found for: "SPEP", "UPEP"  Lab Results  Component Value Date   WBC 5.0 01/13/2022   NEUTROABS 3.3 01/13/2022   HGB 10.7 (L) 01/13/2022   HCT 33.2 (L) 01/13/2022   MCV 101.5 (H) 01/13/2022   PLT 164 01/13/2022      Chemistry      Component Value Date/Time   NA 140 01/13/2022 0859   NA 142 01/04/2017 0902   K 4.1 01/13/2022 0859   K 3.9 01/04/2017 0902   CL 106 01/13/2022 0859   CO2 29 01/13/2022 0859   CO2 28 01/04/2017 0902   BUN 19 01/13/2022 0859   BUN 9.5 01/04/2017 0902   CREATININE 0.70 01/13/2022 0859   CREATININE 0.82 10/13/2021 0742   CREATININE 0.7 01/04/2017 0902      Component Value Date/Time   CALCIUM 9.2 01/13/2022 0859   CALCIUM 9.0 01/04/2017 0902   ALKPHOS 56 01/13/2022 0859    ALKPHOS 101 01/04/2017  0902   AST 13 (L) 01/13/2022 0859   AST 13 (L) 10/13/2021 0742   AST 21 01/04/2017 0902   ALT 9 01/13/2022 0859   ALT 9 10/13/2021 0742   ALT 16 01/04/2017 0902   BILITOT 0.3 01/13/2022 0859   BILITOT 0.3 10/13/2021 0742   BILITOT 0.56 01/04/2017 0902

## 2022-01-13 NOTE — Assessment & Plan Note (Signed)
Recent light chain was elevated but overall, the ratio is not much different compared to last month' results So far, she tolerated 36 mg per metered square of kyprolis She has mild pancytopenia but overall asymptomatic However, if her light chains start to trend up, we might consider increasing the dose of Kyprolis

## 2022-01-13 NOTE — Patient Instructions (Signed)
[facility] MEDICAL ONCOLOGY  Discharge Instructions: Thank you for choosing [facility] to provide your oncology and hematology care.   If you have a lab appointment with the Cancer Center, please go directly to the Cancer Center and check in at the registration area.   Wear comfortable clothing and clothing appropriate for easy access to any Portacath or PICC line.   We strive to give you quality time with your provider. You may need to reschedule your appointment if you arrive late (15 or more minutes).  Arriving late affects you and other patients whose appointments are after yours.  Also, if you miss three or more appointments without notifying the office, you may be dismissed from the clinic at the provider's discretion.      For prescription refill requests, have your pharmacy contact our office and allow 72 hours for refills to be completed.    Today you received the following chemotherapy and/or immunotherapy agents kyprolis      To help prevent nausea and vomiting after your treatment, we encourage you to take your nausea medication as directed.  BELOW ARE SYMPTOMS THAT SHOULD BE REPORTED IMMEDIATELY: *FEVER GREATER THAN 100.4 F (38 C) OR HIGHER *CHILLS OR SWEATING *NAUSEA AND VOMITING THAT IS NOT CONTROLLED WITH YOUR NAUSEA MEDICATION *UNUSUAL SHORTNESS OF BREATH *UNUSUAL BRUISING OR BLEEDING *URINARY PROBLEMS (pain or burning when urinating, or frequent urination) *BOWEL PROBLEMS (unusual diarrhea, constipation, pain near the anus) TENDERNESS IN MOUTH AND THROAT WITH OR WITHOUT PRESENCE OF ULCERS (sore throat, sores in mouth, or a toothache) UNUSUAL RASH, SWELLING OR PAIN  UNUSUAL VAGINAL DISCHARGE OR ITCHING   Items with * indicate a potential emergency and should be followed up as soon as possible or go to the Emergency Department if any problems should occur.  Please show the CHEMOTHERAPY ALERT CARD or IMMUNOTHERAPY ALERT CARD at check-in to  the Emergency Department and triage nurse.  Should you have questions after your visit or need to cancel or reschedule your appointment, please contact [facility]ICAL [facility]COL[facility]  Dept: [phone]  and follow the prompts.  Office hours are 8:00 a.m. to 4:30 p.m. Monday - Friday. Please note that voicemails left after 4:00 p.m. may not be returned until the following business day.  We are closed weekends and major holidays. You have access to a nurse at all times for urgent questions. Please call the main number to the clinic Dept: [phone] and follow the prompts.   For any non-urgent questions, you may also contact your provider using MyChart. We now offer e-Visits for anyone 18 and older to request care online for non-urgent symptoms. For details visit [email or web address].   Also download the MyChart app! Go to the app store, search "MyChart", open the app, select [facility], and log in with your MyChart username and password.  Masks are optional in the cancer centers. If you would like for your care team to wear a mask while they are taking care of you, please let them know. For doctor visits, patients may have with them one support person who is at least 76 years old. At this time, visitors are not allowed in the infusion area. 

## 2022-01-16 ENCOUNTER — Other Ambulatory Visit: Payer: Self-pay

## 2022-01-16 ENCOUNTER — Telehealth: Payer: Self-pay

## 2022-01-16 NOTE — Telephone Encounter (Signed)
Returned her call. Canceled port/lab flush appt on 8/11, she will get labs at [facility] on 8/8. She appreciated the call.

## 2022-01-19 ENCOUNTER — Other Ambulatory Visit: Payer: Self-pay

## 2022-01-20 ENCOUNTER — Other Ambulatory Visit: Payer: Self-pay

## 2022-01-20 ENCOUNTER — Inpatient Hospital Stay: Payer: Medicare HMO

## 2022-01-20 VITALS — BP 150/75 | HR 60 | Temp 99.2°F | Resp 18 | Wt 163.2 lb

## 2022-01-20 DIAGNOSIS — C9002 Multiple myeloma in relapse: Secondary | ICD-10-CM

## 2022-01-20 DIAGNOSIS — D469 Myelodysplastic syndrome, unspecified: Secondary | ICD-10-CM

## 2022-01-20 DIAGNOSIS — Z5111 Encounter for antineoplastic chemotherapy: Secondary | ICD-10-CM | POA: Diagnosis not present

## 2022-01-20 DIAGNOSIS — C946 Myelodysplastic disease, not classified: Secondary | ICD-10-CM

## 2022-01-20 LAB — COMPREHENSIVE METABOLIC PANEL
ALT: 7 U/L (ref 0–44)
AST: 12 U/L — ABNORMAL LOW (ref 15–41)
Albumin: 3.9 g/dL (ref 3.5–5.0)
Alkaline Phosphatase: 66 U/L (ref 38–126)
Anion gap: 6 (ref 5–15)
BUN: 27 mg/dL — ABNORMAL HIGH (ref 8–23)
CO2: 28 mmol/L (ref 22–32)
Calcium: 8.9 mg/dL (ref 8.9–10.3)
Chloride: 106 mmol/L (ref 98–111)
Creatinine, Ser: 0.8 mg/dL (ref 0.44–1.00)
GFR, Estimated: >60 mL/min (ref >60)
Glucose, Bld: 99 mg/dL (ref 70–99)
Potassium: 4.1 mmol/L (ref 3.5–5.1)
Sodium: 140 mmol/L (ref 135–145)
Total Bilirubin: 0.3 mg/dL (ref 0.3–1.2)
Total Protein: 6.5 g/dL (ref 6.5–8.1)

## 2022-01-20 LAB — CBC WITH DIFFERENTIAL/PLATELET
Abs Immature Granulocytes: 0.01 10*3/uL (ref 0.00–0.07)
Basophils Absolute: 0 10*3/uL (ref 0.0–0.1)
Basophils Relative: 0 %
Eosinophils Absolute: 0 10*3/uL (ref 0.0–0.5)
Eosinophils Relative: 0 %
HCT: 34.1 % — ABNORMAL LOW (ref 36.0–46.0)
Hemoglobin: 11.2 g/dL — ABNORMAL LOW (ref 12.0–15.0)
Immature Granulocytes: 0 %
Lymphocytes Relative: 18 %
Lymphs Abs: 1 10*3/uL (ref 0.7–4.0)
MCH: 33.2 pg (ref 26.0–34.0)
MCHC: 32.8 g/dL (ref 30.0–36.0)
MCV: 101.2 fL — ABNORMAL HIGH (ref 80.0–100.0)
Monocytes Absolute: 0.8 10*3/uL (ref 0.1–1.0)
Monocytes Relative: 13 %
Neutro Abs: 3.9 10*3/uL (ref 1.7–7.7)
Neutrophils Relative %: 69 %
Platelets: 174 10*3/uL (ref 150–400)
RBC: 3.37 MIL/uL — ABNORMAL LOW (ref 3.87–5.11)
RDW: 13.6 % (ref 11.5–15.5)
WBC: 5.8 10*3/uL (ref 4.0–10.5)
nRBC: 0 % (ref 0.0–0.2)

## 2022-01-20 MED ORDER — SODIUM CHLORIDE 0.9% FLUSH
10.0000 mL | INTRAVENOUS | Status: DC | PRN
  Administered 2022-01-20: 10 mL

## 2022-01-20 MED ORDER — HEPARIN SOD (PORK) LOCK FLUSH 100 UNIT/ML IV SOLN
500.0000 [IU] | Freq: Once | INTRAVENOUS | Status: AC | PRN
  Administered 2022-01-20: 500 [IU]

## 2022-01-20 MED ORDER — SODIUM CHLORIDE 0.9 % IV SOLN: Freq: Once | INTRAVENOUS | Status: AC

## 2022-01-20 MED ORDER — DEXTROSE 5 % IV SOLN
34.0000 mg/m2 | Freq: Once | INTRAVENOUS | Status: AC
  Administered 2022-01-20: 60 mg via INTRAVENOUS
  Filled 2022-01-20: qty 30

## 2022-01-20 MED ORDER — SODIUM CHLORIDE 0.9% FLUSH
10.0000 mL | Freq: Once | INTRAVENOUS | Status: AC
  Administered 2022-01-20: 10 mL

## 2022-01-20 MED ORDER — SODIUM CHLORIDE 0.9 % IV SOLN: Freq: Once | INTRAVENOUS | Status: DC

## 2022-01-20 NOTE — Progress Notes (Signed)
Pt states she took decadron at 830

## 2022-01-20 NOTE — Patient Instructions (Signed)
[name] MEDICAL ONCOLOG[facility]  Discharge Instructions: Thank you for choosing [facility] to provide your oncology and hematology care.   If you have a lab appointment with the Cancer Center, please go directly to the Cancer Center and check in at the registration area.   Wear comfortable clothing and clothing appropriate for easy access to any Portacath or PICC line.   We strive to give you quality time with your provider. You may need to reschedule your appointment if you arrive late (15 or more minutes).  Arriving late affects you and other patients whose appointments are after yours.  Also, if you miss three or more appointments without notifying the office, you may be dismissed from the clinic at the provider's discretion.      For prescription refill requests, have your pharmacy contact our office and allow 72 hours for refills to be completed.    Today you received the following chemotherapy and/or immunotherapy agents kyprolis      To help prevent nausea and vomiting after your treatment, we encourage you to take your nausea medication as directed.  BELOW ARE SYMPTOMS THAT SHOULD BE REPORTED IMMEDIATELY: *FEVER GREATER THAN 100.4 F (38 C) OR HIGHER *CHILLS OR SWEATING *NAUSEA AND VOMITING THAT IS NOT CONTROLLED WITH YOUR NAUSEA MEDICATION *UNUSUAL SHORTNESS OF BREATH *UNUSUAL BRUISING OR BLEEDING *URINARY PROBLEMS (pain or burning when urinating, or frequent urination) *BOWEL PROBLEMS (unusual diarrhea, constipation, pain near the anus) TENDERNESS IN MOUTH AND THROAT WITH OR WITHOUT PRESENCE OF ULCERS (sore throat, sores in mouth, or a toothache) UNUSUAL RASH, SWELLING OR PAIN  UNUSUAL VAGINAL DISCHARGE OR ITCHING   Items with * indicate a potential emergency and should be followed up as soon as possible or go to the Emergency Department if any problems should occur.  Please show the CHEMOTHERAPY ALERT CARD or IMMUNOTHERAPY ALERT CARD at check-in to  the Emergency Department and triage nurse.  Should you have questions after your visit or need to cancel or reschedule your appointment, please contact [facility] ONCOL[facility]  Dept: [phone]  and follow the prompts.  Office hours are 8:00 a.m. to 4:30 p.m. Monday - Friday. Please note that voicemails left after 4:00 p.m. may not be returned until the following business day.  We are closed weekends and major holidays. You have access to a nurse at all times for urgent questions. Please call the main number to the clinic Dept: [phone] and follow the prompts.   For any non-urgent questions, you may also contact your provider using MyChart. We now offer e-Visits for anyone 18 and older to request care online for non-urgent symptoms. For details visit [email or web address].   Also download the MyChart app! Go to the app store, search "MyChart", open the app, select , and log in with your MyChart username and password.  Masks are optional in the cancer centers. If you would like for your care team to wear a mask while they are taking care of you, please let them know. For doctor visits, patients may have with them one support Jean Davidson who is at least 76 years old. At this time, visitors are not allowed in the infusion area. 

## 2022-01-24 ENCOUNTER — Other Ambulatory Visit: Payer: Self-pay

## 2022-02-03 ENCOUNTER — Inpatient Hospital Stay: Payer: Medicare HMO | Attending: Hematology and Oncology

## 2022-02-03 ENCOUNTER — Inpatient Hospital Stay: Payer: Medicare HMO

## 2022-02-03 ENCOUNTER — Other Ambulatory Visit: Payer: Self-pay | Admitting: Hematology and Oncology

## 2022-02-03 ENCOUNTER — Other Ambulatory Visit: Payer: Self-pay

## 2022-02-03 VITALS — BP 129/73 | HR 64 | Temp 98.5°F | Resp 18 | Wt 166.8 lb

## 2022-02-03 DIAGNOSIS — G620 Drug-induced polyneuropathy: Secondary | ICD-10-CM | POA: Insufficient documentation

## 2022-02-03 DIAGNOSIS — Z5111 Encounter for antineoplastic chemotherapy: Secondary | ICD-10-CM | POA: Diagnosis present

## 2022-02-03 DIAGNOSIS — D6481 Anemia due to antineoplastic chemotherapy: Secondary | ICD-10-CM | POA: Insufficient documentation

## 2022-02-03 DIAGNOSIS — C9002 Multiple myeloma in relapse: Secondary | ICD-10-CM | POA: Diagnosis not present

## 2022-02-03 MED ORDER — DEXTROSE 5 % IV SOLN
60.0000 mg | Freq: Once | INTRAVENOUS | Status: DC
  Filled 2022-02-03: qty 30

## 2022-02-03 MED ORDER — SODIUM CHLORIDE 0.9 % IV SOLN: Freq: Once | INTRAVENOUS | Status: AC

## 2022-02-03 MED ORDER — SODIUM CHLORIDE 0.9% FLUSH
10.0000 mL | INTRAVENOUS | Status: DC | PRN
  Administered 2022-02-03: 10 mL

## 2022-02-03 MED ORDER — HEPARIN SOD (PORK) LOCK FLUSH 100 UNIT/ML IV SOLN
500.0000 [IU] | Freq: Once | INTRAVENOUS | Status: AC | PRN
  Administered 2022-02-03: 500 [IU]

## 2022-02-03 MED ORDER — DEXTROSE 5 % IV SOLN
60.0000 mg | Freq: Once | INTRAVENOUS | Status: AC
  Administered 2022-02-03: 60 mg via INTRAVENOUS
  Filled 2022-02-03: qty 30

## 2022-02-03 NOTE — Patient Instructions (Signed)
[facility]  Discharge Instructions: Thank you for choosing [facility] to provide your oncology and hematology care.   If you have a lab appointment with the Cancer Center, please go directly to the Cancer Center and check in at the registration area.   Wear comfortable clothing and clothing appropriate for easy access to any Portacath or PICC line.   We strive to give you quality time with your provider. You may need to reschedule your appointment if you arrive late (15 or more minutes).  Arriving late affects you and other patients whose appointments are after yours.  Also, if you miss three or more appointments without notifying the office, you may be dismissed from the clinic at the provider's discretion.      For prescription refill requests, have your pharmacy contact our office and allow 72 hours for refills to be completed.    Today you received the following chemotherapy and/or immunotherapy agents kyprolis      To help prevent nausea and vomiting after your treatment, we encourage you to take your nausea medication as directed.  BELOW ARE SYMPTOMS THAT SHOULD BE REPORTED IMMEDIATELY: *FEVER GREATER THAN 100.4 F (38 C) OR HIGHER *CHILLS OR SWEATING *NAUSEA AND VOMITING THAT IS NOT CONTROLLED WITH YOUR NAUSEA MEDICATION *UNUSUAL SHORTNESS OF BREATH *UNUSUAL BRUISING OR BLEEDING *URINARY PROBLEMS (pain or burning when urinating, or frequent urination) *BOWEL PROBLEMS (unusual diarrhea, constipation, pain near the anus) TENDERNESS IN MOUTH AND THROAT WITH OR WITHOUT PRESENCE OF ULCERS (sore throat, sores in mouth, or a toothache) UNUSUAL RASH, SWELLING OR PAIN  UNUSUAL VAGINAL DISCHARGE OR ITCHING   Items with * indicate a potential emergency and should be followed up as soon as possible or go to the Emergency Department if any problems should occur.  Please show the CHEMOTHERAPY ALERT CARD or IMMUNOTHERAPY ALERT CARD at check-in to  the Emergency Department and triage nurse.  Should you have questions after your visit or need to cancel or reschedule your appointment, please contact [facility]  Dept: [phone]  and follow the prompts.  Office hours are 8:00 a.m. to 4:30 p.m. Monday - Friday. Please note that voicemails left after 4:00 p.m. may not be returned until the following business day.  We are closed weekends and major holidays. You have access to a nurse at all times for urgent questions. Please call the main number to the clinic Dept: [phone] and follow the prompts.   For any non-urgent questions, you may also contact your provider using MyChart. We now offer e-Visits for anyone 18 and older to request care online for non-urgent symptoms. For details visit [email or web address].   Also download the MyChart app! Go to the app store, search "MyChart", open the app, select [facility], and log in with your MyChart username and password.  Masks are optional in the cancer centers. If you would like for your care team to wear a mask while they are taking care of you, please let them know. You may have one support person who is at least 76 years old accompany you for your appointments. 

## 2022-02-03 NOTE — Progress Notes (Signed)
Pt takes decadron at home

## 2022-02-06 ENCOUNTER — Other Ambulatory Visit: Payer: Self-pay

## 2022-02-10 ENCOUNTER — Inpatient Hospital Stay (HOSPITAL_BASED_OUTPATIENT_CLINIC_OR_DEPARTMENT_OTHER): Payer: Medicare HMO | Admitting: Hematology and Oncology

## 2022-02-10 ENCOUNTER — Other Ambulatory Visit: Payer: Self-pay | Admitting: Hematology and Oncology

## 2022-02-10 ENCOUNTER — Inpatient Hospital Stay: Payer: Medicare HMO

## 2022-02-10 ENCOUNTER — Encounter: Payer: Self-pay | Admitting: Hematology and Oncology

## 2022-02-10 ENCOUNTER — Other Ambulatory Visit: Payer: Self-pay

## 2022-02-10 VITALS — BP 146/56 | HR 83 | Resp 18

## 2022-02-10 DIAGNOSIS — T451X5A Adverse effect of antineoplastic and immunosuppressive drugs, initial encounter: Secondary | ICD-10-CM

## 2022-02-10 DIAGNOSIS — C9002 Multiple myeloma in relapse: Secondary | ICD-10-CM

## 2022-02-10 DIAGNOSIS — D6481 Anemia due to antineoplastic chemotherapy: Secondary | ICD-10-CM | POA: Diagnosis not present

## 2022-02-10 DIAGNOSIS — D469 Myelodysplastic syndrome, unspecified: Secondary | ICD-10-CM

## 2022-02-10 DIAGNOSIS — Z5111 Encounter for antineoplastic chemotherapy: Secondary | ICD-10-CM | POA: Diagnosis not present

## 2022-02-10 DIAGNOSIS — G620 Drug-induced polyneuropathy: Secondary | ICD-10-CM | POA: Diagnosis not present

## 2022-02-10 LAB — CBC WITH DIFFERENTIAL/PLATELET
Abs Immature Granulocytes: 0.03 10*3/uL (ref 0.00–0.07)
Basophils Absolute: 0 10*3/uL (ref 0.0–0.1)
Basophils Relative: 0 %
Eosinophils Absolute: 0 10*3/uL (ref 0.0–0.5)
Eosinophils Relative: 0 %
HCT: 33.6 % — ABNORMAL LOW (ref 36.0–46.0)
Hemoglobin: 10.9 g/dL — ABNORMAL LOW (ref 12.0–15.0)
Immature Granulocytes: 1 %
Lymphocytes Relative: 10 %
Lymphs Abs: 0.7 10*3/uL (ref 0.7–4.0)
MCH: 32.5 pg (ref 26.0–34.0)
MCHC: 32.4 g/dL (ref 30.0–36.0)
MCV: 100.3 fL — ABNORMAL HIGH (ref 80.0–100.0)
Monocytes Absolute: 0.3 10*3/uL (ref 0.1–1.0)
Monocytes Relative: 5 %
Neutro Abs: 5.5 10*3/uL (ref 1.7–7.7)
Neutrophils Relative %: 84 %
Platelets: 214 10*3/uL (ref 150–400)
RBC: 3.35 MIL/uL — ABNORMAL LOW (ref 3.87–5.11)
RDW: 13.2 % (ref 11.5–15.5)
WBC: 6.5 10*3/uL (ref 4.0–10.5)
nRBC: 0 % (ref 0.0–0.2)

## 2022-02-10 LAB — COMPREHENSIVE METABOLIC PANEL
ALT: 8 U/L (ref 0–44)
AST: 11 U/L — ABNORMAL LOW (ref 15–41)
Albumin: 4.1 g/dL (ref 3.5–5.0)
Alkaline Phosphatase: 69 U/L (ref 38–126)
Anion gap: 3 — ABNORMAL LOW (ref 5–15)
BUN: 21 mg/dL (ref 8–23)
CO2: 28 mmol/L (ref 22–32)
Calcium: 9.3 mg/dL (ref 8.9–10.3)
Chloride: 108 mmol/L (ref 98–111)
Creatinine, Ser: 0.73 mg/dL (ref 0.44–1.00)
GFR, Estimated: >60 mL/min (ref >60)
Glucose, Bld: 134 mg/dL — ABNORMAL HIGH (ref 70–99)
Potassium: 4.3 mmol/L (ref 3.5–5.1)
Sodium: 139 mmol/L (ref 135–145)
Total Bilirubin: 0.3 mg/dL (ref 0.3–1.2)
Total Protein: 6.7 g/dL (ref 6.5–8.1)

## 2022-02-10 MED ORDER — SODIUM CHLORIDE 0.9 % IV SOLN: Freq: Once | INTRAVENOUS | Status: AC

## 2022-02-10 MED ORDER — DEXAMETHASONE 4 MG PO TABS
4.0000 mg | ORAL_TABLET | Freq: Once | ORAL | Status: AC
  Administered 2022-02-10: 4 mg via ORAL
  Filled 2022-02-10: qty 1

## 2022-02-10 MED ORDER — HEPARIN SOD (PORK) LOCK FLUSH 100 UNIT/ML IV SOLN
250.0000 [IU] | Freq: Once | INTRAVENOUS | Status: AC
  Administered 2022-02-10: 250 [IU]

## 2022-02-10 MED ORDER — DEXTROSE 5 % IV SOLN
34.0000 mg/m2 | Freq: Once | INTRAVENOUS | Status: DC
  Filled 2022-02-10: qty 30

## 2022-02-10 MED ORDER — SODIUM CHLORIDE 0.9% FLUSH
10.0000 mL | Freq: Once | INTRAVENOUS | Status: AC
  Administered 2022-02-10: 10 mL

## 2022-02-10 MED ORDER — DEXTROSE 5 % IV SOLN
34.0000 mg/m2 | Freq: Once | INTRAVENOUS | Status: AC
  Administered 2022-02-10: 60 mg via INTRAVENOUS
  Filled 2022-02-10: qty 30

## 2022-02-10 NOTE — Assessment & Plan Note (Signed)
I have reviewed her recent test at [facility] She has appointment pending soon to see her oncologist at [facility] For now, we will continue treatment as prescribed

## 2022-02-10 NOTE — Assessment & Plan Note (Signed)
This is likely due to recent treatment. The patient denies recent history of bleeding such as epistaxis, hematuria or hematochezia. She is asymptomatic from the anemia. I will observe for now.  She does not require transfusion now. I will continue the chemotherapy at current dose without dosage adjustment.  If the anemia gets progressive worse in the future, I might have to delay her treatment or adjust the chemotherapy dose.  

## 2022-02-10 NOTE — Assessment & Plan Note (Signed)
she has mild peripheral neuropathy, likely related to side effects of treatment. It is only mild, not bothering the patient. I will observe for now If it gets worse in the future, I will consider modifying the dose of the treatment  

## 2022-02-10 NOTE — Patient Instructions (Signed)

## 2022-02-10 NOTE — Progress Notes (Signed)
[facility] OFFICE PROGRESS NOTE  Patient Care Team: [name], [name], MD as PCP - General (Internal Medicine) [name], MD as PCP - Cardiology (Cardiology) [name], MD as Referring Physician (Hematology and Oncology) [name], MD as Consulting Physician (Internal Medicine) [name], [name], MD as Consulting Physician (Infectious Diseases) [name], MD as Consulting Physician (Hematology and Oncology) [name], NP as Nurse Practitioner (Hematology and Oncology) [name], MD as Consulting Physician (Hematology and Oncology)  ASSESSMENT & PLAN:  Multiple myeloma in relapse [facility]) I have reviewed her recent test at [facility] She has appointment pending soon to see her oncologist at [facility] For now, we will continue treatment as prescribed  Anemia due to antineoplastic chemotherapy This is likely due to recent treatment. The patient denies recent history of bleeding such as epistaxis, hematuria or hematochezia. She is asymptomatic from the anemia. I will observe for now.  She does not require transfusion now. I will continue the chemotherapy at current dose without dosage adjustment.  If the anemia gets progressive worse in the future, I might have to delay her treatment or adjust the chemotherapy dose.   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 It is only mild, not bothering the patient. I will observe for now If it gets worse in the future, I will consider modifying the dose of the treatment   No orders of the defined types were placed in this encounter.   All questions were answered. The patient knows to call the clinic with any problems, questions or concerns. The total time spent in the appointment was 20 minutes encounter with patients including review of chart and various tests results, discussions about plan of care and  coordination of care plan   [name], MD 02/10/2022 6:04 PM  INTERVAL HISTORY: Please see below for problem oriented charting. she returns for treatment follow-up on carfilzomib for treatment of recurrent myeloma She has significant concerns related to recent test results at [facility] She is somewhat sedentary and is concerned about her recent weight gain She has some mild peripheral neuropathy and is concerned whether she should continue current treatment  REVIEW OF SYSTEMS:   Constitutional: Denies fevers, chills or abnormal weight loss Eyes: Denies blurriness of vision Ears, nose, mouth, throat, and face: Denies mucositis or sore throat Respiratory: Denies cough, dyspnea or wheezes Cardiovascular: Denies palpitation, chest discomfort or lower extremity swelling Gastrointestinal:  Denies nausea, heartburn or change in bowel habits Skin: Denies abnormal skin rashes Lymphatics: Denies new lymphadenopathy or easy bruising Behavioral/Psych: Mood is stable, no new changes  All other systems were reviewed with the patient and are negative.  I have reviewed the past medical history, past surgical history, social history and family history with the patient and they are unchanged from previous note.  ALLERGIES:  has No Known Allergies.  MEDICATIONS:  Current Outpatient Medications  Medication Sig Dispense Refill   acyclovir (ZOVIRAX) 400 MG tablet TAKE 1 TABLET BY MOUTH TWICE A DAY 180 tablet 11   aspirin 325 MG tablet Take 325 mg by mouth daily.     calcium carbonate (TUMS - DOSED IN MG ELEMENTAL CALCIUM) 500 MG chewable tablet Chew 1 tablet by mouth daily.     Carfilzomib (KYPROLIS IV) Inject into the vein once a week.     Cholecalciferol 25 MCG (1000 UT) tablet Take 1,000 Units by mouth daily.  Cyanocobalamin (VITAMIN B12 PO) Take 1 tablet by mouth daily.     dexamethasone (DECADRON) 4 MG tablet Take 1 tablet (4 mg total) by mouth once a week. Take 1 tablet by mouth 1 hour before  chemotherapy 5 tablet 5   loperamide (IMODIUM) 2 MG capsule Take by mouth as needed for diarrhea or loose stools. As needed only     metoprolol tartrate (LOPRESSOR) 25 MG tablet TAKE 1 & 1/2 TABLET BY MOUTH TWICE A DAY 270 tablet 0   mirtazapine (REMERON) 7.5 MG tablet Take 1 tablet (7.5 mg total) by mouth at bedtime. 90 tablet 1   Multiple Vitamin (MULTIVITAMIN WITH MINERALS) TABS tablet Take 1 tablet by mouth daily.     Multiple Vitamins-Minerals (PRESERVISION AREDS 2 PO) Take 1 tablet by mouth 2 (two) times daily.     ondansetron (ZOFRAN) 8 MG tablet Take 1 tablet (8 mg total) by mouth every 8 (eight) hours as needed (Nausea or vomiting). 30 tablet 1   pantoprazole (PROTONIX) 40 MG tablet TAKE 1 TABLET BY MOUTH 2 TIMES DAILY. TAKE 30-60 MINUTES BEFORE BREAKFAST AND DINNER. 180 tablet 2   prochlorperazine (COMPAZINE) 10 MG tablet Take 1 tablet (10 mg total) by mouth every 6 (six) hours as needed (Nausea or vomiting). 30 tablet 1   No current facility-administered medications for this visit.    SUMMARY OF ONCOLOGIC HISTORY: Oncology History Overview Note  Multiple myeloma, Ig A Lambda, M spike 3.54 grams, Calcium 9.2, Creatinine 0.8, Beta 2 microglobulin 4.52, IgA 4840 mg/dL, lambda light chain 75.4, albumin 3.6, hemoglobin 9.7, platelet 115    Primary site: Multiple Myeloma   Staging method: AJCC 6th Edition   Clinical: Stage IIA signed by [name], MD on 11/07/2013  2:46 PM   Summary: Stage IIA S/p Allo transplant at [facility] on revlimid, pomalyst, daratumumab and velcade     Multiple myeloma in relapse ([place])  10/31/2013 Bone Marrow Biopsy   Bone marrow biopsy confirmed multiple myeloma with 40% bone marrow involvement. Skeletal survey showed minimal lesions in her score with generalized demineralization   11/10/2013 - 02/13/2014 Chemotherapy   The patient is started on induction chemotherapy with weekly dexamethasone 40 mg by mouth as well as Velcade subcutaneous injection on  days 1, 4, 8 and 11. On 11/21/2013, she was started on monthly Zometa.   12/23/2013 Adverse Reaction   The dose of Velcade was reduced due to thrombocytopenia.   01/28/2014 - 04/07/2014 Chemotherapy   Revlimid is added. Treatment was discontinued due to lack of response.   02/24/2014 - 04/07/2014 Chemotherapy   Due to worsening peripheral neuropathy, Velcade injection is changed to once a week. Revlimid was given 21 days on, 7 days off.   04/07/2014 - 04/10/2014 Chemotherapy   Revlimid was discontinued due to lack of response. Chemotherapy was changed back to Velcade injection twice a week, 2 weeks on 1 week off. Her treatment was switched to to minimum response   04/20/2014 - 06/02/2014 Chemotherapy   chemotherapy is switched to Carfilzomib, Cytoxan and dexamethasone.   04/22/2014 Procedure   she has placement of port for chemotherapy.   06/01/2014 Tumor Marker   Bloodwork show that she has greater than partial response   06/23/2014 Bone Marrow Biopsy   Bone marrow biopsy show 5-10% residual plasma cells, normal cytogenetics and FISH   07/07/2014 Procedure   She had stem cell collection   07/22/2014 - 07/22/2014 Chemotherapy   She had high-dose chemotherapy with melphalan   07/23/2014 Bone Marrow  Transplant   She had bone marrow transplant in autologous fashion at [facility]   10/20/2014 - 03/24/2015 Chemotherapy    she received chemotherapy with Kyprolis, Revlimid and dexamethasone   10/22/2014 Procedure   She has port placement   01/19/2015 Tumor Marker   IgA lambda M spike at 0.4 g    01/20/2015 Miscellaneous   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 No cource was found and fever resolved with IV vancomycin and meropenem   09/13/2015 Bone Marrow Biopsy   Bone marrow biopsy showed no increased blasts, 3-4 % plasma cells   03/02/2016 Bone Marrow Biopsy   Bone marrow biopsy at [facility] showed normocellular (30%) bone marrow with trilineage hematopoiesis. No significant increase in blasts. No significant increase in plasma cells.   05/12/2016 Imaging   DEXA scan at [place] showed osteopenia   10/24/2016 Imaging   Skeletal survey at [facility], no new lesions   12/07/2017 Imaging   No focal abnormality noted to suggest myeloma. Exam is stable from prior exam.   03/01/2018 Procedure   Successful 8 French right internal jugular vein power port placement with its tip at the SVC/RA junction.   03/06/2018 -  Chemotherapy   The patient had daratumumab    07/09/2018 Bone Marrow Biopsy   Bone marrow biopsy at [facility] showed residual disease at 0.004% plasma cells   07/03/2019 Bone Marrow Biopsy   A. Bone marrow, flow cytometric analysis for multiple myeloma minimal residual disease detection:   Negative. No phenotypically abnormal plasma cells at or above the limit of detection identified.     No monotypic B-cell population identified. Negative for increased blasts.   03/25/2021 Bone Marrow Biopsy   Repeat bone marrow biopsy at [facility] showed female donor karyotype.  5 to 7% plasma cell is seen.  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   04/20/2021 Echocardiogram   1. Left ventricular ejection fraction, by estimation, is 60 to 65%. The left ventricle has normal function. The left ventricle has no regional wall motion abnormalities. Left ventricular diastolic parameters were normal.  2.  Right ventricular systolic function is normal. The right ventricular size is normal.  3. The mitral valve is normal in structure. No evidence of mitral valve regurgitation. No evidence of mitral stenosis.  4. The aortic valve is normal in structure. Aortic valve regurgitation is not visualized. No aortic stenosis is present.  5. The inferior vena cava is normal in size with greater than 50% respiratory variability, suggesting right atrial pressure of 3 mmHg.   04/29/2021 -  Chemotherapy   Patient is on Treatment Plan : MYELOMA RELAPSED/ REFRACTORY Carfilzomib D1,8,15 (20/27) + Dexamethasone (KPd) q28d     05/04/2021 PET scan   1. No evidence active multiple myeloma within the skeleton on FDG PET scan. 2. No suspicious lytic lesion on CT portion exam. 3. No plasmacytoma.   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   10/31/2015 Miscellaneous   She has engrafted     PHYSICAL EXAMINATION: ECOG PERFORMANCE STATUS: 1 - Symptomatic but completely ambulatory  Vitals:   02/10/22 0940  BP: (!) 117/92  Pulse: 70  Resp: 18  Temp: 97.7 F (36.5 C)  SpO2: 100%   Filed Weights   02/10/22 0940  Weight: 169 lb 3.2 oz (76.7 kg)    GENERAL:alert, no distress and comfortable NEURO: alert & oriented x 3 with fluent speech, no focal motor/sensory deficits  LABORATORY DATA:  I have reviewed the data as  listed    Component Value Date/Time   NA 139 02/10/2022 0916   NA 142 01/04/2017 0902   K 4.3 02/10/2022 0916   K 3.9 01/04/2017 0902   CL 108 02/10/2022 0916   CO2 28 02/10/2022 0916   CO2 28 01/04/2017 0902   GLUCOSE 134 (H) 02/10/2022 0916   GLUCOSE 92 01/04/2017 0902   BUN 21 02/10/2022 0916   BUN 9.5 01/04/2017 0902   CREATININE 0.73 02/10/2022 0916   CREATININE 0.82 10/13/2021 0742   CREATININE 0.7 01/04/2017 0902   CALCIUM 9.3 02/10/2022 0916   CALCIUM 9.0 01/04/2017 0902   PROT 6.7 02/10/2022 0916   PROT 6.0 (L) 01/04/2017 0902   PROT 5.8 (L) 01/04/2017 0902   ALBUMIN 4.1 02/10/2022 0916   ALBUMIN 3.4 (L) 01/04/2017 0902   AST 11 (L) 02/10/2022 0916   AST 13 (L) 10/13/2021 0742   AST 21 01/04/2017 0902   ALT 8 02/10/2022 0916   ALT 9 10/13/2021 0742   ALT 16 01/04/2017 0902   ALKPHOS 69 02/10/2022 0916   ALKPHOS 101 01/04/2017 0902   BILITOT 0.3 02/10/2022 0916   BILITOT 0.3 10/13/2021 0742   BILITOT 0.56 01/04/2017 0902   GFRNONAA >60 02/10/2022 0916   GFRNONAA >60 10/13/2021 0742   GFRAA >60 03/25/2020 0917   GFRAA >60 12/18/2018 0901    No results found for: "SPEP", "UPEP"  Lab Results  Component Value Date   WBC 6.5 02/10/2022   NEUTROABS 5.5 02/10/2022   HGB 10.9 (L) 02/10/2022   HCT 33.6 (L) 02/10/2022   MCV 100.3 (H) 02/10/2022   PLT 214 02/10/2022      Chemistry      Component Value Date/Time   NA 139 02/10/2022 0916   NA 142 01/04/2017 0902   K 4.3 02/10/2022 0916   K 3.9 01/04/2017 0902   CL 108 02/10/2022 0916   CO2 28 02/10/2022 0916   CO2 28 01/04/2017 0902   BUN 21 02/10/2022 0916   BUN 9.5 01/04/2017 0902   CREATININE 0.73 02/10/2022 0916   CREATININE 0.82 10/13/2021 0742   CREATININE 0.7 01/04/2017 0902      Component Value Date/Time   CALCIUM 9.3 02/10/2022 0916   CALCIUM 9.0 01/04/2017 0902   ALKPHOS 69 02/10/2022 0916   ALKPHOS 101 01/04/2017 0902   AST 11 (L) 02/10/2022 0916   AST 13 (L) 10/13/2021 0742   AST 21  01/04/2017  0902   ALT 8 02/10/2022 0916   ALT 9 10/13/2021 0742   ALT 16 01/04/2017 0902   BILITOT 0.3 02/10/2022 0916   BILITOT 0.3 10/13/2021 0742   BILITOT 0.56 01/04/2017 0902

## 2022-02-10 NOTE — Patient Instructions (Signed)
[facility]  Discharge Instructions: Thank you for choosing [facility] to provide your oncology and hematology care.   If you have a lab appointment with the Cancer Center, please go directly to the Cancer Center and check in at the registration area.   Wear comfortable clothing and clothing appropriate for easy access to any Portacath or PICC line.   We strive to give you quality time with your provider. You may need to reschedule your appointment if you arrive late (15 or more minutes).  Arriving late affects you and other patients whose appointments are after yours.  Also, if you miss three or more appointments without notifying the office, you may be dismissed from the clinic at the provider's discretion.      For prescription refill requests, have your pharmacy contact our office and allow 72 hours for refills to be completed.    Today you received the following chemotherapy and/or immunotherapy agents: Kyprolis.       To help prevent nausea and vomiting after your treatment, we encourage you to take your nausea medication as directed.  BELOW ARE SYMPTOMS THAT SHOULD BE REPORTED IMMEDIATELY: *FEVER GREATER THAN 100.4 F (38 C) OR HIGHER *CHILLS OR SWEATING *NAUSEA AND VOMITING THAT IS NOT CONTROLLED WITH YOUR NAUSEA MEDICATION *UNUSUAL SHORTNESS OF BREATH *UNUSUAL BRUISING OR BLEEDING *URINARY PROBLEMS (pain or burning when urinating, or frequent urination) *BOWEL PROBLEMS (unusual diarrhea, constipation, pain near the anus) TENDERNESS IN MOUTH AND THROAT WITH OR WITHOUT PRESENCE OF ULCERS (sore throat, sores in mouth, or a toothache) UNUSUAL RASH, SWELLING OR PAIN  UNUSUAL VAGINAL DISCHARGE OR ITCHING   Items with * indicate a potential emergency and should be followed up as soon as possible or go to the Emergency Department if any problems should occur.  Please show the CHEMOTHERAPY ALERT CARD or IMMUNOTHERAPY ALERT CARD at check-in to  the Emergency Department and triage nurse.  Should you have questions after your visit or need to cancel or reschedule your appointment, please contact [facility]  Dept: [phone]  and follow the prompts.  Office hours are 8:00 a.m. to 4:30 p.m. Monday - Friday. Please note that voicemails left after 4:00 p.m. may not be returned until the following business day.  We are closed weekends and major holidays. You have access to a nurse at all times for urgent questions. Please call the main number to the clinic Dept: [phone] and follow the prompts.   For any non-urgent questions, you may also contact your provider using MyChart. We now offer e-Visits for anyone 18 and older to request care online for non-urgent symptoms. For details visit [email or web address].   Also download the MyChart app! Go to the app store, search "MyChart", open the app, select , and log in with your MyChart username and password.  Masks are optional in the cancer centers. If you would like for your care team to wear a mask while they are taking care of you, please let them know. You may have one support person who is at least 76 years old accompany you for your appointments. 

## 2022-02-11 ENCOUNTER — Other Ambulatory Visit: Payer: Self-pay | Admitting: Cardiovascular Disease

## 2022-02-11 ENCOUNTER — Other Ambulatory Visit: Payer: Self-pay

## 2022-02-13 LAB — KAPPA/LAMBDA LIGHT CHAINS
Kappa free light chain: 5.6 mg/L (ref 3.3–19.4)
Kappa, lambda light chain ratio: 0.06 — ABNORMAL LOW (ref 0.26–1.65)
Lambda free light chains: 101.2 mg/L — ABNORMAL HIGH (ref 5.7–26.3)

## 2022-02-14 ENCOUNTER — Encounter: Payer: Self-pay | Admitting: Hematology and Oncology

## 2022-02-14 ENCOUNTER — Ambulatory Visit: Payer: Medicare HMO | Admitting: Physician Assistant

## 2022-02-15 ENCOUNTER — Encounter: Payer: Self-pay | Admitting: Medical

## 2022-02-15 ENCOUNTER — Ambulatory Visit: Payer: Medicare HMO | Admitting: Medical

## 2022-02-15 VITALS — BP 122/68 | HR 63 | Ht 62.0 in | Wt 165.0 lb

## 2022-02-15 DIAGNOSIS — I10 Essential (primary) hypertension: Secondary | ICD-10-CM | POA: Diagnosis not present

## 2022-02-15 DIAGNOSIS — I3139 Other pericardial effusion (noninflammatory): Secondary | ICD-10-CM

## 2022-02-15 DIAGNOSIS — C9002 Multiple myeloma in relapse: Secondary | ICD-10-CM | POA: Diagnosis not present

## 2022-02-15 DIAGNOSIS — I471 Supraventricular tachycardia: Secondary | ICD-10-CM

## 2022-02-15 LAB — MULTIPLE MYELOMA PANEL, SERUM
Albumin SerPl Elph-Mcnc: 3.4 g/dL (ref 2.9–4.4)
Albumin/Glob SerPl: 1.2 (ref 0.7–1.7)
Alpha 1: 0.3 g/dL (ref 0.0–0.4)
Alpha2 Glob SerPl Elph-Mcnc: 1 g/dL (ref 0.4–1.0)
B-Globulin SerPl Elph-Mcnc: 0.9 g/dL (ref 0.7–1.3)
Gamma Glob SerPl Elph-Mcnc: 0.7 g/dL (ref 0.4–1.8)
Globulin, Total: 2.9 g/dL (ref 2.2–3.9)
IgA: 398 mg/dL (ref 64–422)
IgG (Immunoglobin G), Serum: 323 mg/dL — ABNORMAL LOW (ref 586–1602)
IgM (Immunoglobulin M), Srm: 25 mg/dL — ABNORMAL LOW (ref 26–217)
M Protein SerPl Elph-Mcnc: 0.4 g/dL — ABNORMAL HIGH
Total Protein ELP: 6.3 g/dL (ref 6.0–8.5)

## 2022-02-15 NOTE — Progress Notes (Signed)
Cardiology Office Note:    Date:  02/15/2022   ID:  Jean Davidson, DOB August 18, 1945, MRN [id number]  PCP:  Haywood Pao, MD  [facility] Cardiologist:  Kathlyn Sacramento, MD  [facility] Electrophysiologist:  None   Referring MD: Haywood Pao, MD   Chief Complaint: 6 month follow-up  History of Present Illness:    Jean Davidson is a 76 y.o. female with a hx of paroxysmal SVT, multiple myeloma status post bone marrow transplant in 2016, MDS status post bone marrow transplant in 06/2018, pericardial effusion without intervention, HTN, palpitations, anemia, and cervical spine stenosis who presents for follow-up of SVT.   She was previously seen by Dr. Fletcher Anon in 2016 for palpitations with Holter monitor showing NSR with one PVC as well as several PACs. Echo was overall normal as was a treadmill stress test. In the setting of hypertension, she was started on Coreg. She has known multiple myeloma with relapsing disease noted in 2016. She is followed by [facility]. At some point, she developed a large pericardial effusion and was seen by [facility] cardiology with initial plan of pericardiocentesis, though the effusion decreased in size without intervention. Echo in 2018 showed a small pericardial effusion.    She was seen in 10/2018 with increasing palpitations.  Zio patch showed a predominant rhythm of sinus with an average heart rate of 71 bpm (range 51 (2:47 AM) -167 bpm), 34 SVT runs occurred with the fastest interval lasting 4 beats with a maximum rate of 167 bpm and the longest interval lasting 15 beats with an average heart rate of 136 bpm, isolated PACs, atrial couplet and triplets were rare, isolated PVCs and ventricular triplets were rare. There were no patient triggered events.  Given her history of pericardial effusion she underwent repeat echo in 01/2019 which showed an EF of 55 to 60%, normal LV cavity size, normal LV diastolic function, normal RV systolic function, normal RV cavity size,  mildly elevated PASP of 38.3 mmHg, mildly dilated left atrium, mild mitral regurgitation, mild tricuspid regurgitation, and trivial pericardial effusion.  She was transitioned from carvedilol to metoprolol with improvement in her symptoms.  She was seen in 05/2019 during episodes of left calf discomfort with ultrasound being negative for DVT and ABIs normal bilaterally.  She was seen in 12/2019 with atypical chest pain, palpitations, and fatigue.  Due to the murmur noted on exam she underwent echo in 01/2020 which showed an EF of 60 to 65%, no regional wall motion abnormalities, normal LV diastolic function parameters, normal RV systolic function and ventricular cavity size, normal PASP, mild mitral regurgitation, an estimated right atrial pressure of 3 mmHg, and no evidence of pericardial effusion.  She was last seen in the office for follow-up in 02/2020 with continued fatigue which was felt to be in the setting of her underlying stressors/comorbid conditions/multiple myeloma therapy.  No changes were indicated.   Last seen 09/2020 and was doing well from a cardiac perspective.    Today, the patient reports LLE after chemo treatment for multiple myeloma. No chest pain. She gets SOB sometimes on exertion, this is not any worse. Recent labs look good. She has occasional palpitations. Heart rate can be up to 90s or 100. She is taking Lopressor  37.63m daily.   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   Multiple myeloma ([place])    Osteopenia    Pinched nerve    Sinusitis, bacterial 07/27/2015   Spinal stenosis in cervical region    Varicose veins of lower extremities with inflammation     Past Surgical History:  Procedure Laterality Date   BLADDER SUSPENSION  1988/1989   x2   COLONOSCOPY     ESOPHAGOGASTRODUODENOSCOPY     IR IMAGING GUIDED PORT INSERTION  03/01/2018   LIMBAL STEM CELL TRANSPLANT     x2   PORT-A-CATH REMOVAL     SHOULDER SURGERY Right 04/06/13   right   UPPER GASTROINTESTINAL ENDOSCOPY      Current Medications: Current Meds  Medication Sig   acyclovir (ZOVIRAX) 400 MG tablet TAKE 1 TABLET BY MOUTH TWICE A DAY   aspirin 325 MG tablet Take 325 mg by mouth daily.   calcium carbonate (TUMS - DOSED IN MG ELEMENTAL CALCIUM) 500 MG chewable tablet Chew 1 tablet by mouth daily.   Carfilzomib (KYPROLIS IV) Inject into the vein once a week.   Cholecalciferol 25 MCG (1000 UT) tablet Take 1,000 Units by mouth daily.   Cyanocobalamin (VITAMIN B12 PO) Take 1 tablet by mouth daily.   dexamethasone (DECADRON) 4 MG tablet Take 1 tablet (4 mg total) by mouth once a week. Take 1 tablet by mouth 1 hour before chemotherapy   loperamide (IMODIUM) 2 MG capsule Take by mouth as needed for diarrhea or loose stools. As needed only   metoprolol tartrate (LOPRESSOR) 25 MG tablet TAKE 1 & 1/2 TABLET BY MOUTH TWICE A DAY   mirtazapine (REMERON) 7.5 MG tablet Take 1 tablet (7.5 mg total) by mouth at bedtime.   Multiple Vitamin (MULTIVITAMIN WITH MINERALS) TABS tablet Take 1 tablet by mouth daily.   Multiple Vitamins-Minerals (PRESERVISION AREDS 2 PO) Take 1 tablet by mouth 2 (two) times daily.   ondansetron (ZOFRAN) 8 MG tablet Take 1 tablet (8 mg total) by mouth every 8 (eight) hours as needed (Nausea or vomiting).   pantoprazole (PROTONIX) 40 MG tablet TAKE 1 TABLET BY MOUTH 2 TIMES  DAILY. TAKE 30-60 MINUTES BEFORE BREAKFAST AND DINNER.   prochlorperazine (COMPAZINE) 10 MG tablet Take 1 tablet (10 mg total) by mouth every 6 (six) hours as needed (Nausea or vomiting).     Allergies:   Patient has no known allergies.   Social History   Socioeconomic History   Marital status: Divorced    Spouse name: Not on file   Number of children: Not on file   Years of education: Not on file   Highest education level: Not on file  Occupational History   Not on file  Tobacco Use   Smoking status: Former    Packs/day: 0.50    Years: 4.00    Total pack years: 2.00    Types: Cigarettes    Quit date: 06/26/1968    Years since quitting: 53.6   Smokeless  tobacco: Never  Vaping Use   Vaping Use: Never used  Substance and Sexual Activity   Alcohol use: Yes    Alcohol/week: 1.0 standard drink of alcohol    Types: 1 Cans of beer per week    Comment: 1 per week per pt   Drug use: No   Sexual activity: Not on file  Other Topics Concern   Not on file  Social History Narrative   Chauncey Reading is daughter azelia, reiger will,  full code ( reviewed 34)   Social Determinants of Health   Financial Resource Strain: Not on file  Food Insecurity: Not on file  Transportation Needs: Not on file  Physical Activity: Not on file  Stress: Not on file  Social Connections: Not on file     Family History: The patient's family history includes Colon cancer (age of onset: 36) in her paternal aunt; Diabetes in her father; Heart disease in her brother, father, and mother; Heart failure in her father; Hypertension in her brother; Kidney disease in her father; Skin cancer in her brother and father; Throat cancer in her father. There is no history of Stomach cancer, Esophageal cancer, or Rectal cancer.  ROS:   Please see the history of present illness.     All other systems reviewed and are negative.  EKGs/Labs/Other Studies Reviewed:    The following studies were reviewed today:  Echo  03/2021  1. Left ventricular ejection fraction, by estimation, is 60 to 65%. The  left ventricle has normal function. The left ventricle has no regional  wall motion abnormalities. Left ventricular diastolic parameters were  normal.   2. Right ventricular systolic function is normal. The right ventricular  size is normal.   3. The mitral valve is normal in structure. No evidence of mitral valve  regurgitation. No evidence of mitral stenosis.   4. The aortic valve is normal in structure. Aortic valve regurgitation is  not visualized. No aortic stenosis is present.   5. The inferior vena cava is normal in size with greater than 50%  respiratory variability, suggesting right atrial pressure of 3 mmHg.   Comparison(s): LVEF 60-65.   EKG:  EKG is ordered today.  The ekg ordered today demonstrates NSR, 63bpm, PACs, nonspecific t wave changes  Recent Labs: 03/01/2021: TSH 1.034 02/10/2022: ALT 8; BUN 21; Creatinine, Ser 0.73; Hemoglobin 10.9; Platelets 214; Potassium 4.3; Sodium 139  Recent Lipid Panel    Component Value Date/Time   CHOL 110 08/08/2013 1127   TRIG 82.0 08/08/2013 1127   HDL 36.50 (L) 08/08/2013 1127   CHOLHDL 3 08/08/2013 1127   VLDL 16.4 08/08/2013 1127   LDLCALC 57 08/08/2013 1127    Physical Exam:    VS:  BP 122/68 (BP Location: Left Arm, Patient Position: Sitting, Cuff Size: Normal)   Pulse 63   Ht '5\' 2"'  (1.575 m)   Wt 165 lb (74.8 kg)   SpO2 97%   BMI 30.18 kg/m     Wt Readings from Last 3 Encounters:  02/15/22 165 lb (74.8 kg)  02/10/22 169 lb 3.2 oz (76.7 kg)  02/03/22 166 lb 12.8 oz (75.7 kg)     GEN:  Well nourished, well developed in no acute distress HEENT: Normal NECK: No JVD; No carotid bruits LYMPHATICS: No lymphadenopathy CARDIAC: RRR, no murmurs, rubs, gallops RESPIRATORY:  Clear to auscultation without rales, wheezing or rhonchi  ABDOMEN: Soft, non-tender, non-distended MUSCULOSKELETAL:  No edema; No deformity  SKIN: Warm and  dry NEUROLOGIC:  Alert and  oriented x 3 PSYCHIATRIC:  Normal affect   ASSESSMENT:    1. Paroxysmal SVT (supraventricular tachycardia) ([place])   2. Essential hypertension   3. Multiple myeloma in relapse ([place])   4. Pericardial effusion    PLAN:    In order of problems listed above:  pSVT She reports occasional palpitations, mostly controlled on Lopressor 37.59m daily. No further work-up.   HTN BP is good today, continue Lopressor 37.579mdaily.   Multiple Myeloma [facility]  Pericardial effusion Echo in 03/2021 showed no effusion  Disposition: Follow up in 6 month(s) with MD/APP     Signed, Maritta Kief H Ninfa MeekerPA-C  02/15/2022 6:45 PM    [facility]

## 2022-02-15 NOTE — Patient Instructions (Signed)
Medication Instructions:  Your physician recommends that you continue on your current medications as directed. Please refer to the Current Medication list given to you today.  *If you need a refill on your cardiac medications before your next appointment, please call your pharmacy*   Lab Work: None ordered If you have labs (blood work) drawn today and your tests are completely normal, you will receive your results only by: MyChart Message (if you have MyChart) OR A paper copy in the mail If you have any lab test that is abnormal or we need to change your treatment, we will call you to review the results.   Testing/Procedures: None ordered   Follow-Up: At [facility], you and your health needs are our priority.  As part of our continuing mission to provide you with exceptional heart care, we have created designated Provider Care Teams.  These Care Teams include your primary Cardiologist (physician) and Advanced Practice Providers (APPs -  Physician Assistants and Nurse Practitioners) who all work together to provide you with the care you need, when you need it.  We recommend signing up for the patient portal called "MyChart".  Sign up information is provided on this After Visit Summary.  MyChart is used to connect with patients for Virtual Visits (Telemedicine).  Patients are able to view lab/test results, encounter notes, upcoming appointments, etc.  Non-urgent messages can be sent to your provider as well.   To learn more about what you can do with MyChart, go to [email or web address].    Your next appointment:   6 month(s)  The format for your next appointment:   In Person  Provider:   You may see Muhammad Arida, MD or one of the following Advanced Practice Providers on your designated Care Team:   Christopher Berge, NP Ryan Dunn, PA-C Cadence Furth, PA-C   Other Instructions N/A  Important Information About Sugar       

## 2022-02-16 ENCOUNTER — Other Ambulatory Visit: Payer: Self-pay

## 2022-02-16 ENCOUNTER — Inpatient Hospital Stay (HOSPITAL_BASED_OUTPATIENT_CLINIC_OR_DEPARTMENT_OTHER): Payer: Medicare HMO | Admitting: Hematology and Oncology

## 2022-02-16 DIAGNOSIS — C9002 Multiple myeloma in relapse: Secondary | ICD-10-CM

## 2022-02-16 NOTE — Progress Notes (Signed)
HEMATOLOGY-ONCOLOGY ELECTRONIC VISIT PROGRESS NOTE  Patient Care Team: [name], [name], MD as PCP - General (Internal Medicine) [name], MD as PCP - Cardiology (Cardiology) [name], MD as Referring Physician (Hematology and Oncology) [name], MD as Consulting Physician (Internal Medicine) [name], [name], MD as Consulting Physician (Infectious Diseases) [name], MD as Consulting Physician (Hematology and Oncology) [name], NP as Nurse Practitioner (Hematology and Oncology) [name], MD as Consulting Physician (Hematology and Oncology)  I connected with the patient via telephone conference and verified that I am speaking with the correct person using two identifiers. The patient's location is at home and I am providing care from the [facility] I discussed the limitations, risks, security and privacy concerns of performing an evaluation and management service by e-visits and the availability of in person appointments.  I also discussed with the patient that there may be a patient responsible charge related to this service. The patient expressed understanding and agreed to proceed.   ASSESSMENT & PLAN:  Multiple myeloma in relapse [facility]) I have reviewed documentation from [place] the decision as outlined by her oncologist at [facility] The treatment that was outlined were not available here in [facility] forward, I recommend we continue her current treatment as prescribed until further recommendation from [facility] We discussed the risk and benefits of changing or increasing the dose of carfilzomib but given her symptoms of neuropathy, for now, I recommend we continue the same dose as before  No orders of the defined types were placed in this encounter.   INTERVAL HISTORY: Please see below for problem oriented charting. The purpose of today's discussion is to review documentation from [place] and recent myeloma  panel She has numerous questions related to treatment recommendations  SUMMARY OF ONCOLOGIC HISTORY: Oncology History Overview Note  Multiple myeloma, Ig A Lambda, M spike 3.54 grams, Calcium 9.2, Creatinine 0.8, Beta 2 microglobulin 4.52, IgA 4840 mg/dL, lambda light chain 75.4, albumin 3.6, hemoglobin 9.7, platelet 115    Primary site: Multiple Myeloma   Staging method: AJCC 6th Edition   Clinical: Stage IIA signed by [name], MD on 11/07/2013  2:46 PM   Summary: Stage IIA S/p Allo transplant at [facility] on revlimid, pomalyst, daratumumab and velcade     Multiple myeloma in relapse ([place])  10/31/2013 Bone Marrow Biopsy   Bone marrow biopsy confirmed multiple myeloma with 40% bone marrow involvement. Skeletal survey showed minimal lesions in her score with generalized demineralization   11/10/2013 - 02/13/2014 Chemotherapy   The patient is started on induction chemotherapy with weekly dexamethasone 40 mg by mouth as well as Velcade subcutaneous injection on days 1, 4, 8 and 11. On 11/21/2013, she was started on monthly Zometa.   12/23/2013 Adverse Reaction   The dose of Velcade was reduced due to thrombocytopenia.   01/28/2014 - 04/07/2014 Chemotherapy   Revlimid is added. Treatment was discontinued due to lack of response.   02/24/2014 - 04/07/2014 Chemotherapy   Due to worsening peripheral neuropathy, Velcade injection is changed to once a week. Revlimid was given 21 days on, 7 days off.   04/07/2014 - 04/10/2014 Chemotherapy   Revlimid was discontinued due to lack of response. Chemotherapy was changed back to Velcade injection twice a week, 2 weeks on 1 week off. Her treatment was switched to to minimum response   04/20/2014 - 06/02/2014 Chemotherapy   chemotherapy is switched to Carfilzomib, Cytoxan and dexamethasone.   04/22/2014  Procedure   she has placement of port for chemotherapy.   06/01/2014 Tumor Marker   Bloodwork show that she has greater than partial response    06/23/2014 Bone Marrow Biopsy   Bone marrow biopsy show 5-10% residual plasma cells, normal cytogenetics and FISH   07/07/2014 Procedure   She had stem cell collection   07/22/2014 - 07/22/2014 Chemotherapy   She had high-dose chemotherapy with melphalan   07/23/2014 Bone Marrow Transplant   She had bone marrow transplant in autologous fashion at 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 No cource was found and fever resolved with IV vancomycin and meropenem   09/13/2015 Bone Marrow Biopsy   Bone marrow biopsy showed no increased blasts, 3-4 % plasma cells   03/02/2016 Bone Marrow Biopsy   Bone marrow biopsy at [facility] showed normocellular (30%) bone marrow with trilineage hematopoiesis. No significant increase in blasts. No significant increase in plasma cells.   05/12/2016 Imaging   DEXA scan at [place] showed osteopenia   10/24/2016 Imaging   Skeletal survey at [facility], no new lesions   12/07/2017 Imaging   No focal abnormality noted to suggest myeloma. Exam is stable from prior exam.   03/01/2018 Procedure   Successful 8 French right internal jugular vein power port placement with its tip at the SVC/RA junction.   03/06/2018 -  Chemotherapy   The patient had daratumumab    07/09/2018 Bone Marrow  Biopsy   Bone marrow biopsy at [facility] showed residual disease at 0.004% plasma cells   07/03/2019 Bone Marrow Biopsy   A. Bone marrow, flow cytometric analysis for multiple myeloma minimal residual disease detection:   Negative. No phenotypically abnormal plasma cells at or above the limit of detection identified.     No monotypic B-cell population identified. Negative for increased blasts.   03/25/2021 Bone Marrow Biopsy   Repeat bone marrow biopsy at [place] showed female donor karyotype.  5 to 7% plasma cell is seen.  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   04/20/2021 Echocardiogram   1. Left ventricular ejection fraction, by estimation, is 60 to 65%. The left ventricle has normal function. The left ventricle has no regional wall motion abnormalities. Left ventricular diastolic parameters were normal.  2. Right ventricular systolic function is normal. The right ventricular size is normal.  3. The mitral valve is normal in structure. No evidence of mitral valve regurgitation. No evidence of mitral stenosis.  4. The aortic valve is normal in structure. Aortic valve regurgitation is not visualized. No aortic stenosis is present.  5. The inferior vena cava is normal in size with  greater than 50% respiratory variability, suggesting right atrial pressure of 3 mmHg.   04/29/2021 -  Chemotherapy   Patient is on Treatment Plan : MYELOMA RELAPSED/ REFRACTORY Carfilzomib D1,8,15 (20/27) + Dexamethasone (KPd) q28d     05/04/2021 PET scan   1. No evidence active multiple myeloma within the skeleton on FDG PET scan. 2. No suspicious lytic lesion on CT portion exam. 3. No plasmacytoma.   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   10/31/2015 Miscellaneous   She has engrafted     REVIEW OF SYSTEMS:   Constitutional: Denies fevers, chills or abnormal weight loss Eyes: Denies blurriness of vision Ears, nose, mouth, throat, and face: Denies mucositis or sore throat Respiratory: Denies cough, dyspnea or wheezes Cardiovascular: Denies palpitation, chest discomfort Gastrointestinal:  Denies nausea, heartburn or change in bowel habits Skin: Denies abnormal skin rashes Lymphatics: Denies new lymphadenopathy or easy bruising Neurological:Denies numbness, tingling or new weaknesses Behavioral/Psych: Mood is stable, no new changes  Extremities: No lower extremity edema All other systems were reviewed with the patient and are negative.  I have reviewed the past medical history, past surgical history, social history and family history with the patient and they are unchanged from previous note.  ALLERGIES:  has No Known Allergies.  MEDICATIONS:  Current Outpatient Medications  Medication Sig Dispense Refill   acyclovir (ZOVIRAX) 400 MG tablet TAKE 1 TABLET BY MOUTH TWICE A DAY 180 tablet 11   aspirin 325 MG tablet Take 325 mg by mouth daily.     calcium carbonate (TUMS - DOSED IN MG ELEMENTAL CALCIUM) 500 MG chewable tablet Chew 1 tablet by mouth daily.     Carfilzomib (KYPROLIS IV) Inject into the vein once a week.     Cholecalciferol 25 MCG (1000 UT) tablet Take 1,000 Units by mouth daily.     Cyanocobalamin (VITAMIN B12 PO) Take 1 tablet by mouth  daily.     dexamethasone (DECADRON) 4 MG tablet Take 1 tablet (4 mg total) by mouth once a week. Take 1 tablet by mouth 1 hour before chemotherapy 5 tablet 5   loperamide (IMODIUM) 2 MG capsule Take by mouth as needed for diarrhea or loose stools. As needed only     metoprolol tartrate (LOPRESSOR) 25 MG tablet TAKE 1 & 1/2 TABLET BY MOUTH TWICE A DAY 270 tablet 0   mirtazapine (REMERON) 7.5 MG tablet Take 1 tablet (7.5 mg total) by mouth at bedtime. 90 tablet 1   Multiple Vitamin (MULTIVITAMIN WITH MINERALS) TABS tablet Take 1 tablet by mouth daily.     Multiple Vitamins-Minerals (PRESERVISION AREDS 2 PO) Take 1 tablet by mouth 2 (two) times daily.     ondansetron (ZOFRAN) 8 MG tablet Take 1 tablet (8 mg total) by mouth every 8 (eight) hours as needed (Nausea or vomiting). 30 tablet 1   pantoprazole (PROTONIX) 40 MG tablet TAKE 1 TABLET BY MOUTH 2 TIMES DAILY. TAKE 30-60 MINUTES BEFORE BREAKFAST AND DINNER. 180 tablet 2   prochlorperazine (COMPAZINE) 10  MG tablet Take 1 tablet (10 mg total) by mouth every 6 (six) hours as needed (Nausea or vomiting). 30 tablet 1   No current facility-administered medications for this visit.    PHYSICAL EXAMINATION: ECOG PERFORMANCE STATUS: 0 - Asymptomatic  LABORATORY DATA:  I have reviewed the data as listed    Latest Ref Rng & Units 02/10/2022    9:16 AM 01/20/2022    8:19 AM 01/13/2022    8:59 AM  CMP  Glucose 70 - 99 mg/dL 134  99  96   BUN 8 - 23 mg/dL _0 Creatinine 0.44 - 1.00 mg/dL 0.73  0.80  0.70   Sodium 135 - 145 mmol/L 139  140  140   Potassium 3.5 - 5.1 mmol/L 4.3  4.1  4.1   Chloride 98 - 111 mmol/L 108  106  106   CO2 22 - 32 mmol/L _1 Calcium 8.9 - 10.3 mg/dL 9.3  8.9  9.2   Total Protein 6.5 - 8.1 g/dL 6.7  6.5  6.1   Total Bilirubin 0.3 - 1.2 mg/dL 0.3  0.3  0.3   Alkaline Phos 38 - 126 U/L 69  66  56   AST 15 - 41 U/L _2 ALT 0 - 44 U/L _3 Lab Results  Component Value Date   WBC 6.5  02/10/2022   HGB 10.9 (L) 02/10/2022   HCT 33.6 (L) 02/10/2022   MCV 100.3 (H) 02/10/2022   PLT 214 02/10/2022   NEUTROABS 5.5 02/10/2022    I discussed the assessment and treatment plan with the patient. The patient was provided an opportunity to ask questions and all were answered. The patient agreed with the plan and demonstrated an understanding of the instructions. The patient was advised to call back or seek an in-person evaluation if the symptoms worsen or if the condition fails to improve as anticipated.    I spent 25 minutes for the appointment reviewing test results, discuss management and coordination of care.  [name], MD 02/16/2022 10:19 AM

## 2022-02-16 NOTE — Assessment & Plan Note (Signed)
I have reviewed documentation from [place] the decision as outlined by her oncologist at [facility] The treatment that was outlined were not available here in [facility] forward, I recommend we continue her current treatment as prescribed until further recommendation from [facility] We discussed the risk and benefits of changing or increasing the dose of carfilzomib but given her symptoms of neuropathy, for now, I recommend we continue the same dose as before

## 2022-02-17 ENCOUNTER — Inpatient Hospital Stay: Payer: Medicare HMO

## 2022-02-17 ENCOUNTER — Other Ambulatory Visit: Payer: Self-pay

## 2022-02-17 VITALS — BP 130/60 | HR 79 | Temp 98.3°F | Resp 18

## 2022-02-17 DIAGNOSIS — C9002 Multiple myeloma in relapse: Secondary | ICD-10-CM

## 2022-02-17 DIAGNOSIS — C946 Myelodysplastic disease, not classified: Secondary | ICD-10-CM

## 2022-02-17 DIAGNOSIS — Z5111 Encounter for antineoplastic chemotherapy: Secondary | ICD-10-CM | POA: Diagnosis not present

## 2022-02-17 DIAGNOSIS — D469 Myelodysplastic syndrome, unspecified: Secondary | ICD-10-CM

## 2022-02-17 LAB — CBC WITH DIFFERENTIAL/PLATELET
Abs Immature Granulocytes: 0.01 10*3/uL (ref 0.00–0.07)
Basophils Absolute: 0 10*3/uL (ref 0.0–0.1)
Basophils Relative: 0 %
Eosinophils Absolute: 0 10*3/uL (ref 0.0–0.5)
Eosinophils Relative: 0 %
HCT: 33.8 % — ABNORMAL LOW (ref 36.0–46.0)
Hemoglobin: 11 g/dL — ABNORMAL LOW (ref 12.0–15.0)
Immature Granulocytes: 0 %
Lymphocytes Relative: 19 %
Lymphs Abs: 1.1 10*3/uL (ref 0.7–4.0)
MCH: 32.4 pg (ref 26.0–34.0)
MCHC: 32.5 g/dL (ref 30.0–36.0)
MCV: 99.4 fL (ref 80.0–100.0)
Monocytes Absolute: 0.7 10*3/uL (ref 0.1–1.0)
Monocytes Relative: 13 %
Neutro Abs: 3.9 10*3/uL (ref 1.7–7.7)
Neutrophils Relative %: 68 %
Platelets: 197 10*3/uL (ref 150–400)
RBC: 3.4 MIL/uL — ABNORMAL LOW (ref 3.87–5.11)
RDW: 13.3 % (ref 11.5–15.5)
WBC: 5.7 10*3/uL (ref 4.0–10.5)
nRBC: 0 % (ref 0.0–0.2)

## 2022-02-17 LAB — COMPREHENSIVE METABOLIC PANEL
ALT: 8 U/L (ref 0–44)
AST: 11 U/L — ABNORMAL LOW (ref 15–41)
Albumin: 4 g/dL (ref 3.5–5.0)
Alkaline Phosphatase: 69 U/L (ref 38–126)
Anion gap: 5 (ref 5–15)
BUN: 21 mg/dL (ref 8–23)
CO2: 28 mmol/L (ref 22–32)
Calcium: 9.1 mg/dL (ref 8.9–10.3)
Chloride: 106 mmol/L (ref 98–111)
Creatinine, Ser: 0.71 mg/dL (ref 0.44–1.00)
GFR, Estimated: >60 mL/min (ref >60)
Glucose, Bld: 93 mg/dL (ref 70–99)
Potassium: 4.1 mmol/L (ref 3.5–5.1)
Sodium: 139 mmol/L (ref 135–145)
Total Bilirubin: 0.3 mg/dL (ref 0.3–1.2)
Total Protein: 6.5 g/dL (ref 6.5–8.1)

## 2022-02-17 MED ORDER — HEPARIN SOD (PORK) LOCK FLUSH 100 UNIT/ML IV SOLN
500.0000 [IU] | Freq: Once | INTRAVENOUS | Status: AC | PRN
  Administered 2022-02-17: 500 [IU]

## 2022-02-17 MED ORDER — SODIUM CHLORIDE 0.9 % IV SOLN: Freq: Once | INTRAVENOUS | Status: AC

## 2022-02-17 MED ORDER — DEXTROSE 5 % IV SOLN
34.0000 mg/m2 | Freq: Once | INTRAVENOUS | Status: AC
  Administered 2022-02-17: 60 mg via INTRAVENOUS
  Filled 2022-02-17: qty 30

## 2022-02-17 MED ORDER — SODIUM CHLORIDE 0.9% FLUSH
10.0000 mL | Freq: Once | INTRAVENOUS | Status: AC
  Administered 2022-02-17: 10 mL

## 2022-02-17 MED ORDER — SODIUM CHLORIDE 0.9% FLUSH
10.0000 mL | INTRAVENOUS | Status: DC | PRN
  Administered 2022-02-17: 10 mL

## 2022-02-17 NOTE — Progress Notes (Signed)
Patient took dexamethasone at home around 840am.

## 2022-02-24 ENCOUNTER — Other Ambulatory Visit: Payer: Self-pay | Admitting: Hematology and Oncology

## 2022-02-24 DIAGNOSIS — C9002 Multiple myeloma in relapse: Secondary | ICD-10-CM

## 2022-02-24 NOTE — Progress Notes (Signed)
OFF PATHWAY REGIMEN - Multiple Myeloma and Other Plasma Cell Dyscrasias  No Change  Continue With Treatment as Ordered.  Original Decision Date/Time: 04/19/2021 14:27   [id number]:Carfilzomib 20/56 mg/m2 Monotherapy q28 Days:   A cycle is every 28 days:     Carfilzomib      Carfilzomib      Carfilzomib      Carfilzomib   **Always confirm dose/schedule in your pharmacy ordering system**  Patient Characteristics: Multiple Myeloma, Relapsed / Refractory, Second through Fourth Lines of Therapy, Frail or Not a Candidate for Triplet Therapy Disease Classification: Multiple Myeloma R-ISS Staging: III Therapeutic Status: Relapsed Line of Therapy: Fourth Line Intent of Therapy: Non-Curative / Palliative Intent, Discussed with Patient

## 2022-03-03 ENCOUNTER — Other Ambulatory Visit: Payer: Self-pay

## 2022-03-03 ENCOUNTER — Inpatient Hospital Stay: Payer: Medicare HMO | Admitting: Hematology and Oncology

## 2022-03-03 ENCOUNTER — Inpatient Hospital Stay: Payer: Medicare HMO | Attending: Hematology and Oncology

## 2022-03-03 ENCOUNTER — Inpatient Hospital Stay: Payer: Medicare HMO

## 2022-03-03 ENCOUNTER — Encounter: Payer: Self-pay | Admitting: Hematology and Oncology

## 2022-03-03 VITALS — BP 131/64 | HR 70 | Temp 98.7°F | Resp 18

## 2022-03-03 VITALS — BP 148/55 | HR 66 | Temp 98.1°F | Resp 18 | Ht 62.0 in | Wt 167.0 lb

## 2022-03-03 DIAGNOSIS — T451X5A Adverse effect of antineoplastic and immunosuppressive drugs, initial encounter: Secondary | ICD-10-CM

## 2022-03-03 DIAGNOSIS — G620 Drug-induced polyneuropathy: Secondary | ICD-10-CM

## 2022-03-03 DIAGNOSIS — C9002 Multiple myeloma in relapse: Secondary | ICD-10-CM | POA: Insufficient documentation

## 2022-03-03 DIAGNOSIS — Z5111 Encounter for antineoplastic chemotherapy: Secondary | ICD-10-CM | POA: Diagnosis present

## 2022-03-03 DIAGNOSIS — D469 Myelodysplastic syndrome, unspecified: Secondary | ICD-10-CM

## 2022-03-03 DIAGNOSIS — D6481 Anemia due to antineoplastic chemotherapy: Secondary | ICD-10-CM

## 2022-03-03 LAB — CBC WITH DIFFERENTIAL (CANCER CENTER ONLY)
Abs Immature Granulocytes: 0.01 10*3/uL (ref 0.00–0.07)
Basophils Absolute: 0 10*3/uL (ref 0.0–0.1)
Basophils Relative: 0 %
Eosinophils Absolute: 0.2 10*3/uL (ref 0.0–0.5)
Eosinophils Relative: 3 %
HCT: 32.4 % — ABNORMAL LOW (ref 36.0–46.0)
Hemoglobin: 10.6 g/dL — ABNORMAL LOW (ref 12.0–15.0)
Immature Granulocytes: 0 %
Lymphocytes Relative: 20 %
Lymphs Abs: 1.1 10*3/uL (ref 0.7–4.0)
MCH: 32.6 pg (ref 26.0–34.0)
MCHC: 32.7 g/dL (ref 30.0–36.0)
MCV: 99.7 fL (ref 80.0–100.0)
Monocytes Absolute: 0.6 10*3/uL (ref 0.1–1.0)
Monocytes Relative: 10 %
Neutro Abs: 3.8 10*3/uL (ref 1.7–7.7)
Neutrophils Relative %: 67 %
Platelet Count: 210 10*3/uL (ref 150–400)
RBC: 3.25 MIL/uL — ABNORMAL LOW (ref 3.87–5.11)
RDW: 13.4 % (ref 11.5–15.5)
WBC Count: 5.7 10*3/uL (ref 4.0–10.5)
nRBC: 0 % (ref 0.0–0.2)

## 2022-03-03 LAB — CMP (CANCER CENTER ONLY)
ALT: 8 U/L (ref 0–44)
AST: 11 U/L — ABNORMAL LOW (ref 15–41)
Albumin: 3.8 g/dL (ref 3.5–5.0)
Alkaline Phosphatase: 72 U/L (ref 38–126)
Anion gap: 8 (ref 5–15)
BUN: 19 mg/dL (ref 8–23)
CO2: 27 mmol/L (ref 22–32)
Calcium: 9.1 mg/dL (ref 8.9–10.3)
Chloride: 106 mmol/L (ref 98–111)
Creatinine: 0.79 mg/dL (ref 0.44–1.00)
GFR, Estimated: >60 mL/min (ref >60)
Glucose, Bld: 117 mg/dL — ABNORMAL HIGH (ref 70–99)
Potassium: 3.8 mmol/L (ref 3.5–5.1)
Sodium: 141 mmol/L (ref 135–145)
Total Bilirubin: 0.3 mg/dL (ref 0.3–1.2)
Total Protein: 6.6 g/dL (ref 6.5–8.1)

## 2022-03-03 MED ORDER — SODIUM CHLORIDE 0.9 % IV SOLN: Freq: Once | INTRAVENOUS | Status: AC

## 2022-03-03 MED ORDER — DEXTROSE 5 % IV SOLN: 34.0000 mg/m2 | Freq: Once | INTRAVENOUS | Status: DC

## 2022-03-03 MED ORDER — SODIUM CHLORIDE 0.9% FLUSH
10.0000 mL | INTRAVENOUS | Status: DC | PRN
  Administered 2022-03-03: 10 mL

## 2022-03-03 MED ORDER — ZOLEDRONIC ACID 4 MG/100ML IV SOLN
4.0000 mg | Freq: Once | INTRAVENOUS | Status: AC
  Administered 2022-03-03: 4 mg via INTRAVENOUS
  Filled 2022-03-03: qty 100

## 2022-03-03 MED ORDER — MIRTAZAPINE 7.5 MG PO TABS: 7.5000 mg | ORAL_TABLET | Freq: Every day | ORAL | 1 refills | Status: DC

## 2022-03-03 MED ORDER — SODIUM CHLORIDE 0.9% FLUSH
10.0000 mL | Freq: Once | INTRAVENOUS | Status: AC
  Administered 2022-03-03: 10 mL

## 2022-03-03 MED ORDER — HEPARIN SOD (PORK) LOCK FLUSH 100 UNIT/ML IV SOLN
500.0000 [IU] | Freq: Once | INTRAVENOUS | Status: AC | PRN
  Administered 2022-03-03: 500 [IU]

## 2022-03-03 MED ORDER — DEXTROSE 5 % IV SOLN
34.0000 mg/m2 | Freq: Once | INTRAVENOUS | Status: AC
  Administered 2022-03-03: 60 mg via INTRAVENOUS
  Filled 2022-03-03: qty 30

## 2022-03-03 NOTE — Patient Instructions (Signed)
[facility] MEDICAL ONCOLOGY  Discharge Instructions: Thank you for choosing [facility] to provide your oncology and hematology care.   If you have a lab appointment with the Cancer Center, please go directly to the Cancer Center and check in at the registration area.   Wear comfortable clothing and clothing appropriate for easy access to any Portacath or PICC line.   We strive to give you quality time with your provider. You may need to reschedule your appointment if you arrive late (15 or more minutes).  Arriving late affects you and other patients whose appointments are after yours.  Also, if you miss three or more appointments without notifying the office, you may be dismissed from the clinic at the provider's discretion.      For prescription refill requests, have your pharmacy contact our office and allow 72 hours for refills to be completed.    Today you received the following chemotherapy and/or immunotherapy agents: Kyprolis.       To help prevent nausea and vomiting after your treatment, we encourage you to take your nausea medication as directed.  BELOW ARE SYMPTOMS THAT SHOULD BE REPORTED IMMEDIATELY: *FEVER GREATER THAN 100.4 F (38 C) OR HIGHER *CHILLS OR SWEATING *NAUSEA AND VOMITING THAT IS NOT CONTROLLED WITH YOUR NAUSEA MEDICATION *UNUSUAL SHORTNESS OF BREATH *UNUSUAL BRUISING OR BLEEDING *URINARY PROBLEMS (pain or burning when urinating, or frequent urination) *BOWEL PROBLEMS (unusual diarrhea, constipation, pain near the anus) TENDERNESS IN MOUTH AND THROAT WITH OR WITHOUT PRESENCE OF ULCERS (sore throat, sores in mouth, or a toothache) UNUSUAL RASH, SWELLING OR PAIN  UNUSUAL VAGINAL DISCHARGE OR ITCHING   Items with * indicate a potential emergency and should be followed up as soon as possible or go to the Emergency Department if any problems should occur.  Please show the CHEMOTHERAPY ALERT CARD or IMMUNOTHERAPY ALERT CARD at check-in to  the Emergency Department and triage nurse.  Should you have questions after your visit or need to cancel or reschedule your appointment, please contact [facility] MEDICAL ONCOLOGY  Dept: [phone]  and follow the prompts.  Office hours are 8:00 a.m. to 4:30 p.m. Monday - Friday. Please note that voicemails left after 4:00 p.m. may not be returned until the following business day.  We are closed weekends and major holidays. You have access to a nurse at all times for urgent questions. Please call the main number to the clinic Dept: [phone] and follow the prompts.   For any non-urgent questions, you may also contact your provider using MyChart. We now offer e-Visits for anyone 18 and older to request care online for non-urgent symptoms. For details visit [email or web address].   Also download the MyChart app! Go to the app store, search "MyChart", open the app, select [facility], and log in with your MyChart username and password.  Masks are optional in the cancer centers. If you would like for your care team to wear a mask while they are taking care of you, please let them know. You may have one support person who is at least 76 years old accompany you for your appointments. 

## 2022-03-03 NOTE — Progress Notes (Signed)
Patient due for zometa today- corrected calcium is 9.26 , Scr 0.79. Dental clearance per pt.  Patient took her decadron '4mg'$  at 44.

## 2022-03-03 NOTE — Assessment & Plan Note (Signed)
This is likely due to recent treatment. The patient denies recent history of bleeding such as epistaxis, hematuria or hematochezia. She is asymptomatic from the anemia. I will observe for now.  She does not require transfusion now. I will continue the chemotherapy at current dose without dosage adjustment.  If the anemia gets progressive worse in the future, I might have to delay her treatment or adjust the chemotherapy dose.  

## 2022-03-03 NOTE — Assessment & Plan Note (Signed)
she has mild peripheral neuropathy, likely related to side effects of treatment. It is only mild, not bothering the patient. I will observe for now If it gets worse in the future, I will consider modifying the dose of the treatment  

## 2022-03-03 NOTE — Progress Notes (Signed)
[facility] OFFICE PROGRESS NOTE  Patient Care Team: Default, Provider, MD as PCP - General Fletcher Anon Mertie Clause, MD as PCP - Cardiology (Cardiology) [name], MD as Referring Physician (Hematology and Oncology) [name], MD as Consulting Physician (Internal Medicine) [name], [name], MD as Consulting Physician (Infectious Diseases) [name], MD as Consulting Physician (Hematology and Oncology) [name], NP as Nurse Practitioner (Hematology and Oncology) [name], MD as Consulting Physician (Hematology and Oncology)  ASSESSMENT & PLAN:  Multiple myeloma in relapse [facility]) I have reviewed documentation from [place] the decision as outlined by her oncologist at [facility] The treatment that was outlined were not available here in [facility] forward, I recommend we continue her current treatment as prescribed until further recommendation from [place] She has appointment to return back to [place] at the end of the month We discussed the risk and benefits of increasing the dose of carfilzomib or adding Cytoxan Ultimately, the patient has made informed decision to continue on current dose of treatment for at least 6-8 more weeks until she can be treated at [facility]  Anemia due to antineoplastic chemotherapy This is likely due to recent treatment. The patient denies recent history of bleeding such as epistaxis, hematuria or hematochezia. She is asymptomatic from the anemia. I will observe for now.  She does not require transfusion now. I will continue the chemotherapy at current dose without dosage adjustment.  If the anemia gets progressive worse in the future, I might have to delay her treatment or adjust the chemotherapy dose.  Peripheral neuropathy due to chemotherapy [facility]) she has mild peripheral neuropathy, likely related to side effects of treatment. It is only mild, not bothering the patient. I will observe for now If it  gets worse in the future, I will consider modifying the dose of the treatment  Orders Placed This Encounter  Procedures   CBC with Differential ([place] Only)    Standing Status:   Future    Standing Expiration Date:   03/29/2023   CMP ([place] only)    Standing Status:   Future    Standing Expiration Date:   03/29/2023   CBC with Differential ([place] Only)    Standing Status:   Future    Standing Expiration Date:   04/08/2023   CMP ([place] only)    Standing Status:   Future    Standing Expiration Date:   04/08/2023   CBC with Differential ([place] Only)    Standing Status:   Future    Standing Expiration Date:   04/15/2023   CMP ([place] only)    Standing Status:   Future    Standing Expiration Date:   04/15/2023    All questions were answered. The patient knows to call the clinic with any problems, questions or concerns. The total time spent in the appointment was 30 minutes encounter with patients including review of chart and various tests results, discussions about plan of care and coordination of care plan   [name], MD 03/03/2022 10:15 AM  INTERVAL HISTORY: Please see below for problem oriented charting. she returns for treatment follow-up on treatment with carfilzomib We discussed recommendation from [place] She is concerned about cytokine release syndrome and risk of CAR-T cell therapy She has noticed some peripheral neuropathy recently but is not painful or bothering her Denies recent infection Her daughter's wedding is coming up next month and we will accommodate for changing her  appointment Denies recent bone pain  REVIEW OF SYSTEMS:   Constitutional: Denies fevers, chills or abnormal weight loss Eyes: Denies blurriness of vision Ears, nose, mouth, throat, and face: Denies mucositis or sore throat Respiratory: Denies cough, dyspnea or wheezes Cardiovascular: Denies palpitation, chest discomfort or lower extremity  swelling Gastrointestinal:  Denies nausea, heartburn or change in bowel habits Skin: Denies abnormal skin rashes Lymphatics: Denies new lymphadenopathy or easy bruising Neurological:Denies numbness, tingling or new weaknesses Behavioral/Psych: Mood is stable, no new changes  All other systems were reviewed with the patient and are negative.  I have reviewed the past medical history, past surgical history, social history and family history with the patient and they are unchanged from previous note.  ALLERGIES:  has No Known Allergies.  MEDICATIONS:  Current Outpatient Medications  Medication Sig Dispense Refill   acyclovir (ZOVIRAX) 400 MG tablet TAKE 1 TABLET BY MOUTH TWICE A DAY 180 tablet 11   aspirin 325 MG tablet Take 325 mg by mouth daily.     calcium carbonate (TUMS - DOSED IN MG ELEMENTAL CALCIUM) 500 MG chewable tablet Chew 1 tablet by mouth daily.     Carfilzomib (KYPROLIS IV) Inject into the vein once a week.     Cholecalciferol 25 MCG (1000 UT) tablet Take 1,000 Units by mouth daily.     Cyanocobalamin (VITAMIN B12 PO) Take 1 tablet by mouth daily.     dexamethasone (DECADRON) 4 MG tablet Take 1 tablet (4 mg total) by mouth once a week. Take 1 tablet by mouth 1 hour before chemotherapy 5 tablet 5   loperamide (IMODIUM) 2 MG capsule Take by mouth as needed for diarrhea or loose stools. As needed only     metoprolol tartrate (LOPRESSOR) 25 MG tablet TAKE 1 & 1/2 TABLET BY MOUTH TWICE A DAY 270 tablet 0   mirtazapine (REMERON) 7.5 MG tablet Take 1 tablet (7.5 mg total) by mouth at bedtime. 90 tablet 1   Multiple Vitamin (MULTIVITAMIN WITH MINERALS) TABS tablet Take 1 tablet by mouth daily.     Multiple Vitamins-Minerals (PRESERVISION AREDS 2 PO) Take 1 tablet by mouth 2 (two) times daily.     ondansetron (ZOFRAN) 8 MG tablet Take 1 tablet (8 mg total) by mouth every 8 (eight) hours as needed (Nausea or vomiting). 30 tablet 1   pantoprazole (PROTONIX) 40 MG tablet TAKE 1 TABLET BY  MOUTH 2 TIMES DAILY. TAKE 30-60 MINUTES BEFORE BREAKFAST AND DINNER. 180 tablet 2   No current facility-administered medications for this visit.   Facility-Administered Medications Ordered in Other Visits  Medication Dose Route Frequency Provider Last Rate Last Admin   carfilzomib (KYPROLIS) 60 mg in dextrose 5 % 50 mL chemo infusion  34 mg/m2 (Treatment Plan Recorded) Intravenous Once Alvy Bimler, Anab Vivar, MD       heparin lock flush 100 unit/mL  500 Units Intracatheter Once PRN Alvy Bimler, Khyran Riera, MD       sodium chloride flush (NS) 0.9 % injection 10 mL  10 mL Intracatheter PRN Alvy Bimler, Damario Gillie, MD       Zoledronic Acid (ZOMETA) IVPB 4 mg  4 mg Intravenous Once Alvy Bimler, Krissa Utke, MD        SUMMARY OF ONCOLOGIC HISTORY: Oncology History Overview Note  Multiple myeloma, Ig A Lambda, M spike 3.54 grams, Calcium 9.2, Creatinine 0.8, Beta 2 microglobulin 4.52, IgA 4840 mg/dL, lambda light chain 75.4, albumin 3.6, hemoglobin 9.7, platelet 115    Primary site: Multiple Myeloma   Staging method: AJCC 6th Edition  Clinical: Stage IIA signed by [name], MD on 11/07/2013  2:46 PM   Summary: Stage IIA S/p Allo transplant at [facility] on revlimid, pomalyst, daratumumab and velcade     Multiple myeloma in relapse ([place])  10/31/2013 Bone Marrow Biopsy   Bone marrow biopsy confirmed multiple myeloma with 40% bone marrow involvement. Skeletal survey showed minimal lesions in her score with generalized demineralization   11/10/2013 - 02/13/2014 Chemotherapy   The patient is started on induction chemotherapy with weekly dexamethasone 40 mg by mouth as well as Velcade subcutaneous injection on days 1, 4, 8 and 11. On 11/21/2013, she was started on monthly Zometa.   12/23/2013 Adverse Reaction   The dose of Velcade was reduced due to thrombocytopenia.   01/28/2014 - 04/07/2014 Chemotherapy   Revlimid is added. Treatment was discontinued due to lack of response.   02/24/2014 - 04/07/2014 Chemotherapy   Due to worsening  peripheral neuropathy, Velcade injection is changed to once a week. Revlimid was given 21 days on, 7 days off.   04/07/2014 - 04/10/2014 Chemotherapy   Revlimid was discontinued due to lack of response. Chemotherapy was changed back to Velcade injection twice a week, 2 weeks on 1 week off. Her treatment was switched to to minimum response   04/20/2014 - 06/02/2014 Chemotherapy   chemotherapy is switched to Carfilzomib, Cytoxan and dexamethasone.   04/22/2014 Procedure   she has placement of port for chemotherapy.   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 at [facility]   10/20/2014 - 03/24/2015 Chemotherapy    she received chemotherapy with Kyprolis, Revlimid and dexamethasone   10/22/2014 Procedure   She has port placement   01/19/2015 Tumor Marker   IgA lambda M spike at 0.4 g    01/20/2015 Miscellaneous   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 No cource was found and fever resolved with IV vancomycin and meropenem   09/13/2015 Bone Marrow Biopsy   Bone marrow biopsy showed no increased blasts, 3-4 % plasma cells   03/02/2016 Bone Marrow Biopsy   Bone  marrow biopsy at [facility] showed normocellular (30%) bone marrow with trilineage hematopoiesis. No significant increase in blasts. No significant increase in plasma cells.   05/12/2016 Imaging   DEXA scan at [place] showed osteopenia   10/24/2016 Imaging   Skeletal survey at [facility], no new lesions   12/07/2017 Imaging   No focal abnormality noted to suggest myeloma. Exam is stable from prior exam.   03/01/2018 Procedure   Successful 8 French right internal jugular vein power port placement with its tip at the SVC/RA junction.   03/06/2018 -  Chemotherapy   The patient had  daratumumab    07/09/2018 Bone Marrow Biopsy   Bone marrow biopsy at [facility] showed residual disease at 0.004% plasma cells   07/03/2019 Bone Marrow Biopsy   A. Bone marrow, flow cytometric analysis for multiple myeloma minimal residual disease detection:   Negative. No phenotypically abnormal plasma cells at or above the limit of detection identified.     No monotypic B-cell population identified. Negative for increased blasts.   03/25/2021 Bone Marrow Biopsy   Repeat bone marrow biopsy at [place] showed female donor karyotype.  5 to 7% plasma cell is seen.  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   04/20/2021 Echocardiogram   1. Left ventricular ejection fraction, by estimation, is 60 to 65%. The left ventricle has normal function. The left ventricle has no regional wall motion abnormalities. Left ventricular diastolic parameters were normal.  2. Right ventricular systolic function is normal. The right ventricular size is normal.  3. The mitral valve is normal in structure. No evidence of mitral valve regurgitation. No evidence of mitral stenosis.  4. The aortic valve is normal in structure. Aortic valve regurgitation is not visualized. No aortic stenosis is present.  5. The  inferior vena cava is normal in size with greater than 50% respiratory variability, suggesting right atrial pressure of 3 mmHg.   04/29/2021 - 02/17/2022 Chemotherapy   Patient is on Treatment Plan : MYELOMA RELAPSED/ REFRACTORY Carfilzomib D1,8,15 (20/27) + Dexamethasone (KPd) q28d     04/30/2021 -  Chemotherapy   Patient is on Treatment Plan : MYELOMA RELAPSED/REFRACTORY Carfilzomib (20/70) D1,8,15 + Dexamethasone weekly (40) (Kd) q28d  x 9 cycles / Dexamethasone D1,8,15     05/04/2021 PET scan   1. No evidence active multiple myeloma within the skeleton on FDG PET scan. 2. No suspicious lytic lesion on CT portion exam. 3. No plasmacytoma.   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   10/31/2015 Miscellaneous   She has engrafted     PHYSICAL EXAMINATION: ECOG PERFORMANCE STATUS: 1 - Symptomatic but completely ambulatory  Vitals:   03/03/22 0926  BP: (!) 148/55  Pulse: 66  Resp: 18  Temp: 98.1 F (36.7 C)  SpO2: 100%   Filed Weights   03/03/22 0926  Weight: 167 lb (75.8 kg)    GENERAL:alert, no distress and comfortable NEURO: alert & oriented x 3 with fluent speech, no focal motor/sensory deficits  LABORATORY DATA:  I have reviewed the data as listed    Component Value Date/Time   NA 141 03/03/2022 0910   NA 142 01/04/2017 0902   K 3.8 03/03/2022 0910   K 3.9 01/04/2017 0902   CL  106 03/03/2022 0910   CO2 27 03/03/2022 0910   CO2 28 01/04/2017 0902   GLUCOSE 117 (H) 03/03/2022 0910   GLUCOSE 92 01/04/2017 0902   BUN 19 03/03/2022 0910   BUN  9.5 01/04/2017 0902   CREATININE 0.79 03/03/2022 0910   CREATININE 0.7 01/04/2017 0902   CALCIUM 9.1 03/03/2022 0910   CALCIUM 9.0 01/04/2017 0902   PROT 6.6 03/03/2022 0910   PROT 6.0 (L) 01/04/2017 0902   PROT 5.8 (L) 01/04/2017 0902   ALBUMIN 3.8 03/03/2022 0910   ALBUMIN 3.4 (L) 01/04/2017 0902   AST 11 (L) 03/03/2022 0910   AST 21 01/04/2017 0902   ALT 8 03/03/2022 0910   ALT 16 01/04/2017 0902   ALKPHOS 72 03/03/2022 0910   ALKPHOS 101 01/04/2017 0902   BILITOT 0.3 03/03/2022 0910   BILITOT 0.56 01/04/2017 0902   GFRNONAA >60 03/03/2022 0910   GFRAA >60 03/25/2020 0917   GFRAA >60 12/18/2018 0901    No results found for: "SPEP", "UPEP"  Lab Results  Component Value Date   WBC 5.7 03/03/2022   NEUTROABS 3.8 03/03/2022   HGB 10.6 (L) 03/03/2022   HCT 32.4 (L) 03/03/2022   MCV 99.7 03/03/2022   PLT 210 03/03/2022      Chemistry      Component Value Date/Time   NA 141 03/03/2022 0910   NA 142 01/04/2017 0902   K 3.8 03/03/2022 0910   K 3.9 01/04/2017 0902   CL 106 03/03/2022 0910   CO2 27 03/03/2022 0910   CO2 28 01/04/2017 0902   BUN 19 03/03/2022 0910   BUN 9.5 01/04/2017 0902   CREATININE 0.79 03/03/2022 0910   CREATININE 0.7 01/04/2017 0902      Component Value Date/Time   CALCIUM 9.1 03/03/2022 0910   CALCIUM 9.0 01/04/2017 0902   ALKPHOS 72 03/03/2022 0910   ALKPHOS 101 01/04/2017 0902   AST 11 (L) 03/03/2022 0910   AST 21 01/04/2017 0902   ALT 8 03/03/2022 0910   ALT 16 01/04/2017 0902   BILITOT 0.3 03/03/2022 0910   BILITOT 0.56 01/04/2017 0902

## 2022-03-03 NOTE — Assessment & Plan Note (Signed)
I have reviewed documentation from [place] the decision as outlined by her oncologist at [facility] The treatment that was outlined were not available here in [facility] forward, I recommend we continue her current treatment as prescribed until further recommendation from [place] She has appointment to return back to [place] at the end of the month We discussed the risk and benefits of increasing the dose of carfilzomib or adding Cytoxan Ultimately, the patient has made informed decision to continue on current dose of treatment for at least 6-8 more weeks until she can be treated at [facility]

## 2022-03-10 ENCOUNTER — Inpatient Hospital Stay: Payer: Medicare HMO

## 2022-03-10 ENCOUNTER — Other Ambulatory Visit: Payer: Self-pay

## 2022-03-10 VITALS — BP 133/67 | HR 74 | Temp 98.7°F | Resp 18 | Wt 165.0 lb

## 2022-03-10 DIAGNOSIS — Z5111 Encounter for antineoplastic chemotherapy: Secondary | ICD-10-CM | POA: Diagnosis not present

## 2022-03-10 DIAGNOSIS — C9002 Multiple myeloma in relapse: Secondary | ICD-10-CM

## 2022-03-10 DIAGNOSIS — D469 Myelodysplastic syndrome, unspecified: Secondary | ICD-10-CM

## 2022-03-10 LAB — CBC WITH DIFFERENTIAL (CANCER CENTER ONLY)
Abs Immature Granulocytes: 0.01 10*3/uL (ref 0.00–0.07)
Basophils Absolute: 0 10*3/uL (ref 0.0–0.1)
Basophils Relative: 0 %
Eosinophils Absolute: 0 10*3/uL (ref 0.0–0.5)
Eosinophils Relative: 0 %
HCT: 35.3 % — ABNORMAL LOW (ref 36.0–46.0)
Hemoglobin: 11 g/dL — ABNORMAL LOW (ref 12.0–15.0)
Immature Granulocytes: 0 %
Lymphocytes Relative: 21 %
Lymphs Abs: 1.1 10*3/uL (ref 0.7–4.0)
MCH: 32.3 pg (ref 26.0–34.0)
MCHC: 31.2 g/dL (ref 30.0–36.0)
MCV: 103.5 fL — ABNORMAL HIGH (ref 80.0–100.0)
Monocytes Absolute: 0.7 10*3/uL (ref 0.1–1.0)
Monocytes Relative: 13 %
Neutro Abs: 3.5 10*3/uL (ref 1.7–7.7)
Neutrophils Relative %: 66 %
Platelet Count: 188 10*3/uL (ref 150–400)
RBC: 3.41 MIL/uL — ABNORMAL LOW (ref 3.87–5.11)
RDW: 13.7 % (ref 11.5–15.5)
WBC Count: 5.4 10*3/uL (ref 4.0–10.5)
nRBC: 0 % (ref 0.0–0.2)

## 2022-03-10 LAB — CMP (CANCER CENTER ONLY)
ALT: 11 U/L (ref 0–44)
AST: 16 U/L (ref 15–41)
Albumin: 3.6 g/dL (ref 3.5–5.0)
Alkaline Phosphatase: 62 U/L (ref 38–126)
Anion gap: 5 (ref 5–15)
BUN: 11 mg/dL (ref 8–23)
CO2: 26 mmol/L (ref 22–32)
Calcium: 8.5 mg/dL — ABNORMAL LOW (ref 8.9–10.3)
Chloride: 109 mmol/L (ref 98–111)
Creatinine: 0.7 mg/dL (ref 0.44–1.00)
GFR, Estimated: >60 mL/min (ref >60)
Glucose, Bld: 111 mg/dL — ABNORMAL HIGH (ref 70–99)
Potassium: 4 mmol/L (ref 3.5–5.1)
Sodium: 140 mmol/L (ref 135–145)
Total Bilirubin: 0.3 mg/dL (ref 0.3–1.2)
Total Protein: 6.5 g/dL (ref 6.5–8.1)

## 2022-03-10 MED ORDER — HEPARIN SOD (PORK) LOCK FLUSH 100 UNIT/ML IV SOLN
500.0000 [IU] | Freq: Once | INTRAVENOUS | Status: AC | PRN
  Administered 2022-03-10: 500 [IU]

## 2022-03-10 MED ORDER — SODIUM CHLORIDE 0.9 % IV SOLN: Freq: Once | INTRAVENOUS | Status: AC

## 2022-03-10 MED ORDER — SODIUM CHLORIDE 0.9% FLUSH
10.0000 mL | Freq: Once | INTRAVENOUS | Status: AC
  Administered 2022-03-10: 10 mL

## 2022-03-10 MED ORDER — DEXTROSE 5 % IV SOLN
34.0000 mg/m2 | Freq: Once | INTRAVENOUS | Status: AC
  Administered 2022-03-10: 60 mg via INTRAVENOUS
  Filled 2022-03-10: qty 30

## 2022-03-10 MED ORDER — SODIUM CHLORIDE 0.9% FLUSH
10.0000 mL | INTRAVENOUS | Status: DC | PRN
  Administered 2022-03-10: 10 mL

## 2022-03-10 NOTE — Patient Instructions (Signed)
[facility] MEDICAL ONCOLOGY  Discharge Instructions: Thank you for choosing [facility] to provide your oncology and hematology care.   If you have a lab appointment with the Cancer Center, please go directly to the Cancer Center and check in at the registration area.   Wear comfortable clothing and clothing appropriate for easy access to any Portacath or PICC line.   We strive to give you quality time with your provider. You may need to reschedule your appointment if you arrive late (15 or more minutes).  Arriving late affects you and other patients whose appointments are after yours.  Also, if you miss three or more appointments without notifying the office, you may be dismissed from the clinic at the provider's discretion.      For prescription refill requests, have your pharmacy contact our office and allow 72 hours for refills to be completed.    Today you received the following chemotherapy and/or immunotherapy agents: Kyprolis.       To help prevent nausea and vomiting after your treatment, we encourage you to take your nausea medication as directed.  BELOW ARE SYMPTOMS THAT SHOULD BE REPORTED IMMEDIATELY: *FEVER GREATER THAN 100.4 F (38 C) OR HIGHER *CHILLS OR SWEATING *NAUSEA AND VOMITING THAT IS NOT CONTROLLED WITH YOUR NAUSEA MEDICATION *UNUSUAL SHORTNESS OF BREATH *UNUSUAL BRUISING OR BLEEDING *URINARY PROBLEMS (pain or burning when urinating, or frequent urination) *BOWEL PROBLEMS (unusual diarrhea, constipation, pain near the anus) TENDERNESS IN MOUTH AND THROAT WITH OR WITHOUT PRESENCE OF ULCERS (sore throat, sores in mouth, or a toothache) UNUSUAL RASH, SWELLING OR PAIN  UNUSUAL VAGINAL DISCHARGE OR ITCHING   Items with * indicate a potential emergency and should be followed up as soon as possible or go to the Emergency Department if any problems should occur.  Please show the CHEMOTHERAPY ALERT CARD or IMMUNOTHERAPY ALERT CARD at check-in to  the Emergency Department and triage nurse.  Should you have questions after your visit or need to cancel or reschedule your appointment, please contact [facility]ICAL [facility]COL[facility]  Dept: [phone]  and follow the prompts.  Office hours are 8:00 a.m. to 4:30 p.m. Monday - Friday. Please note that voicemails left after 4:00 p.m. may not be returned until the following business day.  We are closed weekends and major holidays. You have access to a nurse at all times for urgent questions. Please call the main number to the clinic Dept: [phone] and follow the prompts.   For any non-urgent questions, you may also contact your provider using MyChart. We now offer e-Visits for anyone 18 and older to request care online for non-urgent symptoms. For details visit [email or web address].   Also download the MyChart app! Go to the app store, search "MyChart", open the app, select [facility], and log in with your MyChart username and password.  Masks are optional in the cancer centers. If you would like for your care team to wear a mask while they are taking care of you, please let them know. You may have one support person who is at least 76 years old accompany you for your appointments. 

## 2022-03-10 NOTE — Progress Notes (Signed)
Per Alvy Bimler MD, ok to proceed with tx today using CMP drawn 9/8

## 2022-03-13 LAB — KAPPA/LAMBDA LIGHT CHAINS
Kappa free light chain: 6.2 mg/L (ref 3.3–19.4)
Kappa, lambda light chain ratio: 0.04 — ABNORMAL LOW (ref 0.26–1.65)
Lambda free light chains: 141.3 mg/L — ABNORMAL HIGH (ref 5.7–26.3)

## 2022-03-16 LAB — MULTIPLE MYELOMA PANEL, SERUM
Albumin SerPl Elph-Mcnc: 3.3 g/dL (ref 2.9–4.4)
Albumin/Glob SerPl: 1.4 (ref 0.7–1.7)
Alpha 1: 0.2 g/dL (ref 0.0–0.4)
Alpha2 Glob SerPl Elph-Mcnc: 0.9 g/dL (ref 0.4–1.0)
B-Globulin SerPl Elph-Mcnc: 0.7 g/dL (ref 0.7–1.3)
Gamma Glob SerPl Elph-Mcnc: 0.6 g/dL (ref 0.4–1.8)
Globulin, Total: 2.5 g/dL (ref 2.2–3.9)
IgA: 395 mg/dL (ref 64–422)
IgG (Immunoglobin G), Serum: 315 mg/dL — ABNORMAL LOW (ref 586–1602)
IgM (Immunoglobulin M), Srm: 21 mg/dL — ABNORMAL LOW (ref 26–217)
M Protein SerPl Elph-Mcnc: 0.4 g/dL — ABNORMAL HIGH
Total Protein ELP: 5.8 g/dL — ABNORMAL LOW (ref 6.0–8.5)

## 2022-03-17 ENCOUNTER — Inpatient Hospital Stay: Payer: Medicare HMO

## 2022-03-17 VITALS — BP 161/67 | HR 66 | Temp 99.3°F | Resp 16 | Wt 167.5 lb

## 2022-03-17 DIAGNOSIS — C9002 Multiple myeloma in relapse: Secondary | ICD-10-CM

## 2022-03-17 DIAGNOSIS — Z5111 Encounter for antineoplastic chemotherapy: Secondary | ICD-10-CM | POA: Diagnosis not present

## 2022-03-17 MED ORDER — DEXTROSE 5 % IV SOLN
34.0000 mg/m2 | Freq: Once | INTRAVENOUS | Status: AC
  Administered 2022-03-17: 60 mg via INTRAVENOUS
  Filled 2022-03-17: qty 30

## 2022-03-17 MED ORDER — HEPARIN SOD (PORK) LOCK FLUSH 100 UNIT/ML IV SOLN
500.0000 [IU] | Freq: Once | INTRAVENOUS | Status: AC | PRN
  Administered 2022-03-17: 500 [IU]

## 2022-03-17 MED ORDER — SODIUM CHLORIDE 0.9% FLUSH
10.0000 mL | INTRAVENOUS | Status: DC | PRN
  Administered 2022-03-17: 10 mL

## 2022-03-17 MED ORDER — SODIUM CHLORIDE 0.9 % IV SOLN: Freq: Once | INTRAVENOUS | Status: AC

## 2022-03-17 NOTE — Patient Instructions (Signed)
[facility] MEDICAL ONCOLOGY  Discharge Instructions: Thank you for choosing [facility] to provide your oncology and hematology care.   If you have a lab appointment with the Cancer Center, please go directly to the Cancer Center and check in at the registration area.   Wear comfortable clothing and clothing appropriate for easy access to any Portacath or PICC line.   We strive to give you quality time with your provider. You may need to reschedule your appointment if you arrive late (15 or more minutes).  Arriving late affects you and other patients whose appointments are after yours.  Also, if you miss three or more appointments without notifying the office, you may be dismissed from the clinic at the provider's discretion.      For prescription refill requests, have your pharmacy contact our office and allow 72 hours for refills to be completed.    Today you received the following chemotherapy and/or immunotherapy agents: carfilzomib      To help prevent nausea and vomiting after your treatment, we encourage you to take your nausea medication as directed.  BELOW ARE SYMPTOMS THAT SHOULD BE REPORTED IMMEDIATELY: *FEVER GREATER THAN 100.4 F (38 C) OR HIGHER *CHILLS OR SWEATING *NAUSEA AND VOMITING THAT IS NOT CONTROLLED WITH YOUR NAUSEA MEDICATION *UNUSUAL SHORTNESS OF BREATH *UNUSUAL BRUISING OR BLEEDING *URINARY PROBLEMS (pain or burning when urinating, or frequent urination) *BOWEL PROBLEMS (unusual diarrhea, constipation, pain near the anus) TENDERNESS IN MOUTH AND THROAT WITH OR WITHOUT PRESENCE OF ULCERS (sore throat, sores in mouth, or a toothache) UNUSUAL RASH, SWELLING OR PAIN  UNUSUAL VAGINAL DISCHARGE OR ITCHING   Items with * indicate a potential emergency and should be followed up as soon as possible or go to the Emergency Department if any problems should occur.  Please show the CHEMOTHERAPY ALERT CARD or IMMUNOTHERAPY ALERT CARD at check-in  to the Emergency Department and triage nurse.  Should you have questions after your visit or need to cancel or reschedule your appointment, please contact [facility]ICAL ONCOLOGY  Dept: [phone]  and follow the prompts.  Office hours are 8:00 a.m. to 4:30 p.m. Monday - Friday. Please note that voicemails left after 4:00 p.m. may not be returned until the following business day.  We are closed weekends and major holidays. You have access to a nurse at all times for urgent questions. Please call the main number to the clinic Dept: [phone] and follow the prompts.   For any non-urgent questions, you may also contact your provider using MyChart. We now offer e-Visits for anyone 18 and older to request care online for non-urgent symptoms. For details visit [email or web address].   Also download the MyChart app! Go to the app store, search "MyChart", open the app, select [place], and log in with your MyChart username and password.  Masks are optional in the cancer centers. If you would like for your care team to wear a mask while they are taking care of you, please let them know. You may have one support person who is at least 76 years old accompany you for your appointments. 

## 2022-03-28 ENCOUNTER — Inpatient Hospital Stay: Payer: Medicare HMO | Attending: Hematology and Oncology

## 2022-03-28 ENCOUNTER — Other Ambulatory Visit: Payer: Self-pay

## 2022-03-28 ENCOUNTER — Inpatient Hospital Stay (HOSPITAL_BASED_OUTPATIENT_CLINIC_OR_DEPARTMENT_OTHER): Payer: Medicare HMO

## 2022-03-28 ENCOUNTER — Inpatient Hospital Stay: Payer: Medicare HMO | Admitting: Hematology and Oncology

## 2022-03-28 ENCOUNTER — Encounter: Payer: Self-pay | Admitting: Hematology and Oncology

## 2022-03-28 VITALS — BP 144/58 | HR 78 | Temp 97.7°F | Resp 18 | Ht 62.0 in | Wt 165.8 lb

## 2022-03-28 DIAGNOSIS — G620 Drug-induced polyneuropathy: Secondary | ICD-10-CM | POA: Insufficient documentation

## 2022-03-28 DIAGNOSIS — D6481 Anemia due to antineoplastic chemotherapy: Secondary | ICD-10-CM | POA: Diagnosis not present

## 2022-03-28 DIAGNOSIS — Z5111 Encounter for antineoplastic chemotherapy: Secondary | ICD-10-CM | POA: Insufficient documentation

## 2022-03-28 DIAGNOSIS — C9002 Multiple myeloma in relapse: Secondary | ICD-10-CM

## 2022-03-28 DIAGNOSIS — R5383 Other fatigue: Secondary | ICD-10-CM

## 2022-03-28 DIAGNOSIS — J029 Acute pharyngitis, unspecified: Secondary | ICD-10-CM | POA: Diagnosis not present

## 2022-03-28 DIAGNOSIS — T451X5A Adverse effect of antineoplastic and immunosuppressive drugs, initial encounter: Secondary | ICD-10-CM

## 2022-03-28 DIAGNOSIS — D469 Myelodysplastic syndrome, unspecified: Secondary | ICD-10-CM

## 2022-03-28 DIAGNOSIS — D801 Nonfamilial hypogammaglobulinemia: Secondary | ICD-10-CM | POA: Diagnosis not present

## 2022-03-28 LAB — CBC WITH DIFFERENTIAL (CANCER CENTER ONLY)
Abs Immature Granulocytes: 0.01 10*3/uL (ref 0.00–0.07)
Basophils Absolute: 0 10*3/uL (ref 0.0–0.1)
Basophils Relative: 0 %
Eosinophils Absolute: 0 10*3/uL (ref 0.0–0.5)
Eosinophils Relative: 0 %
HCT: 32.9 % — ABNORMAL LOW (ref 36.0–46.0)
Hemoglobin: 10.6 g/dL — ABNORMAL LOW (ref 12.0–15.0)
Immature Granulocytes: 0 %
Lymphocytes Relative: 14 %
Lymphs Abs: 0.9 10*3/uL (ref 0.7–4.0)
MCH: 32.1 pg (ref 26.0–34.0)
MCHC: 32.2 g/dL (ref 30.0–36.0)
MCV: 99.7 fL (ref 80.0–100.0)
Monocytes Absolute: 0.7 10*3/uL (ref 0.1–1.0)
Monocytes Relative: 12 %
Neutro Abs: 4.3 10*3/uL (ref 1.7–7.7)
Neutrophils Relative %: 74 %
Platelet Count: 253 10*3/uL (ref 150–400)
RBC: 3.3 MIL/uL — ABNORMAL LOW (ref 3.87–5.11)
RDW: 13.6 % (ref 11.5–15.5)
WBC Count: 5.9 10*3/uL (ref 4.0–10.5)
nRBC: 0 % (ref 0.0–0.2)

## 2022-03-28 LAB — CMP (CANCER CENTER ONLY)
ALT: 7 U/L (ref 0–44)
AST: 11 U/L — ABNORMAL LOW (ref 15–41)
Albumin: 3.8 g/dL (ref 3.5–5.0)
Alkaline Phosphatase: 69 U/L (ref 38–126)
Anion gap: 7 (ref 5–15)
BUN: 15 mg/dL (ref 8–23)
CO2: 27 mmol/L (ref 22–32)
Calcium: 8.7 mg/dL — ABNORMAL LOW (ref 8.9–10.3)
Chloride: 106 mmol/L (ref 98–111)
Creatinine: 0.78 mg/dL (ref 0.44–1.00)
GFR, Estimated: >60 mL/min (ref >60)
Glucose, Bld: 125 mg/dL — ABNORMAL HIGH (ref 70–99)
Potassium: 3.7 mmol/L (ref 3.5–5.1)
Sodium: 140 mmol/L (ref 135–145)
Total Bilirubin: 0.3 mg/dL (ref 0.3–1.2)
Total Protein: 6.2 g/dL — ABNORMAL LOW (ref 6.5–8.1)

## 2022-03-28 MED ORDER — SODIUM CHLORIDE 0.9% FLUSH
10.0000 mL | INTRAVENOUS | Status: DC | PRN
  Administered 2022-03-28: 10 mL

## 2022-03-28 MED ORDER — SODIUM CHLORIDE 0.9% FLUSH
10.0000 mL | Freq: Once | INTRAVENOUS | Status: AC
  Administered 2022-03-28: 10 mL

## 2022-03-28 MED ORDER — DEXTROSE 5 % IV SOLN
34.0000 mg/m2 | Freq: Once | INTRAVENOUS | Status: AC
  Administered 2022-03-28: 60 mg via INTRAVENOUS
  Filled 2022-03-28: qty 30

## 2022-03-28 MED ORDER — HEPARIN SOD (PORK) LOCK FLUSH 100 UNIT/ML IV SOLN
500.0000 [IU] | Freq: Once | INTRAVENOUS | Status: AC | PRN
  Administered 2022-03-28: 500 [IU]

## 2022-03-28 MED ORDER — SODIUM CHLORIDE 0.9 % IV SOLN: Freq: Once | INTRAVENOUS | Status: AC

## 2022-03-28 NOTE — Assessment & Plan Note (Signed)
This is multifactorial, could be due to mild anemia and relative sedentary lifestyle right now Observe closely

## 2022-03-28 NOTE — Progress Notes (Signed)
Pt. states she took Decadron 4 mg po this morning.

## 2022-03-28 NOTE — Assessment & Plan Note (Signed)
I have reviewed documentation from [facility] forward, I recommend we continue her current treatment as prescribed until further recommendation from [place] She has appointment to return back to [place] at the end of the month We discussed the risk and benefits of increasing the dose of carfilzomib or adding Cytoxan Ultimately, the patient has made informed decision to continue on current dose of treatment for at least 6-8 more weeks until she can be treated at [facility]

## 2022-03-28 NOTE — Assessment & Plan Note (Signed)
We discussed risk and benefits of trial of gabapentin She is reluctant to start right now due to undesirable side effects

## 2022-03-28 NOTE — Assessment & Plan Note (Signed)
This is likely due to recent treatment. The patient denies recent history of bleeding such as epistaxis, hematuria or hematochezia. She is asymptomatic from the anemia. I will observe for now.  She does not require transfusion now. I will continue the chemotherapy at current dose without dosage adjustment.  If the anemia gets progressive worse in the future, I might have to delay her treatment or adjust the chemotherapy dose.  

## 2022-03-28 NOTE — Progress Notes (Signed)
[facility] OFFICE PROGRESS NOTE  Patient Care Team: Default, Provider, MD as PCP - General Fletcher Anon Mertie Clause, MD as PCP - Cardiology (Cardiology) [name], MD as Referring Physician (Hematology and Oncology) [name], MD as Consulting Physician (Internal Medicine) [name], [name], MD as Consulting Physician (Infectious Diseases) [name], MD as Consulting Physician (Hematology and Oncology) [name], NP as Nurse Practitioner (Hematology and Oncology) [name], MD as Consulting Physician (Hematology and Oncology)  ASSESSMENT & PLAN:  Multiple myeloma in relapse [facility]) I have reviewed documentation from [facility] Moving forward, I recommend we continue her current treatment as prescribed until further recommendation from [place] She has appointment to return back to [place] at the end of the month We discussed the risk and benefits of increasing the dose of carfilzomib or adding Cytoxan Ultimately, the patient has made informed decision to continue on current dose of treatment for at least 6-8 more weeks until she can be treated at [facility]  Anemia due to antineoplastic chemotherapy This is likely due to recent treatment. The patient denies recent history of bleeding such as epistaxis, hematuria or hematochezia. She is asymptomatic from the anemia. I will observe for now.  She does not require transfusion now. I will continue the chemotherapy at current dose without dosage adjustment.  If the anemia gets progressive worse in the future, I might have to delay her treatment or adjust the chemotherapy dose.  Other fatigue This is multifactorial, could be due to mild anemia and relative sedentary lifestyle right now Observe closely  Peripheral neuropathy due to chemotherapy [facility]) We discussed risk and benefits of trial of gabapentin She is reluctant to start right now due to undesirable side effects  Orders Placed This Encounter   Procedures   CBC with Differential ([place] Only)    Standing Status:   Future    Standing Expiration Date:   04/29/2023   CMP ([place] only)    Standing Status:   Future    Standing Expiration Date:   04/29/2023   CBC with Differential ([place] Only)    Standing Status:   Future    Standing Expiration Date:   05/06/2023   CMP ([place] only)    Standing Status:   Future    Standing Expiration Date:   05/06/2023   CBC with Differential ([place] Only)    Standing Status:   Future    Standing Expiration Date:   05/13/2023   CMP ([place] only)    Standing Status:   Future    Standing Expiration Date:   05/13/2023    All questions were answered. The patient knows to call the clinic with any problems, questions or concerns. The total time spent in the appointment was 25 minutes encounter with patients including review of chart and various tests results, discussions about plan of care and coordination of care plan   [name], MD 03/28/2022 9:00 AM  INTERVAL HISTORY: Please see below for problem oriented charting. she returns for treatment follow-up on maintenance carfilzomib She had undergone several appointments and extensive investigations and tests at [facility] recently Her daughter's wedding is coming up and she is excited She has noted slight worsening neuropathy but is not debilitating She complains of fatigue  REVIEW OF SYSTEMS:   Constitutional: Denies fevers, chills or abnormal weight loss Eyes: Denies blurriness of vision Ears, nose, mouth, throat, and face: Denies mucositis or sore throat Respiratory: Denies cough, dyspnea or wheezes  Cardiovascular: Denies palpitation, chest discomfort or lower extremity swelling Gastrointestinal:  Denies nausea, heartburn or change in bowel habits Skin: Denies abnormal skin rashes Lymphatics: Denies new lymphadenopathy or easy bruising Behavioral/Psych: Mood is stable, no new changes  All other systems  were reviewed with the patient and are negative.  I have reviewed the past medical history, past surgical history, social history and family history with the patient and they are unchanged from previous note.  ALLERGIES:  has No Known Allergies.  MEDICATIONS:  Current Outpatient Medications  Medication Sig Dispense Refill   acyclovir (ZOVIRAX) 400 MG tablet TAKE 1 TABLET BY MOUTH TWICE A DAY 180 tablet 11   aspirin 325 MG tablet Take 325 mg by mouth daily.     calcium carbonate (TUMS - DOSED IN MG ELEMENTAL CALCIUM) 500 MG chewable tablet Chew 1 tablet by mouth daily.     Carfilzomib (KYPROLIS IV) Inject into the vein once a week.     Cholecalciferol 25 MCG (1000 UT) tablet Take 1,000 Units by mouth daily.     Cyanocobalamin (VITAMIN B12 PO) Take 1 tablet by mouth daily.     dexamethasone (DECADRON) 4 MG tablet Take 1 tablet (4 mg total) by mouth once a week. Take 1 tablet by mouth 1 hour before chemotherapy 5 tablet 5   loperamide (IMODIUM) 2 MG capsule Take by mouth as needed for diarrhea or loose stools. As needed only     metoprolol tartrate (LOPRESSOR) 25 MG tablet TAKE 1 & 1/2 TABLET BY MOUTH TWICE A DAY 270 tablet 0   mirtazapine (REMERON) 7.5 MG tablet Take 1 tablet (7.5 mg total) by mouth at bedtime. 90 tablet 1   Multiple Vitamin (MULTIVITAMIN WITH MINERALS) TABS tablet Take 1 tablet by mouth daily.     Multiple Vitamins-Minerals (PRESERVISION AREDS 2 PO) Take 1 tablet by mouth 2 (two) times daily.     ondansetron (ZOFRAN) 8 MG tablet Take 1 tablet (8 mg total) by mouth every 8 (eight) hours as needed (Nausea or vomiting). 30 tablet 1   pantoprazole (PROTONIX) 40 MG tablet TAKE 1 TABLET BY MOUTH 2 TIMES DAILY. TAKE 30-60 MINUTES BEFORE BREAKFAST AND DINNER. 180 tablet 2   No current facility-administered medications for this visit.    SUMMARY OF ONCOLOGIC HISTORY: Oncology History Overview Note  Multiple myeloma, Ig A Lambda, M spike 3.54 grams, Calcium 9.2, Creatinine 0.8,  Beta 2 microglobulin 4.52, IgA 4840 mg/dL, lambda light chain 75.4, albumin 3.6, hemoglobin 9.7, platelet 115    Primary site: Multiple Myeloma   Staging method: AJCC 6th Edition   Clinical: Stage IIA signed by [name], MD on 11/07/2013  2:46 PM   Summary: Stage IIA S/p Allo transplant at [facility] on revlimid, pomalyst, daratumumab and velcade     Multiple myeloma in relapse ([place])  10/31/2013 Bone Marrow Biopsy   Bone marrow biopsy confirmed multiple myeloma with 40% bone marrow involvement. Skeletal survey showed minimal lesions in her score with generalized demineralization   11/10/2013 - 02/13/2014 Chemotherapy   The patient is started on induction chemotherapy with weekly dexamethasone 40 mg by mouth as well as Velcade subcutaneous injection on days 1, 4, 8 and 11. On 11/21/2013, she was started on monthly Zometa.   12/23/2013 Adverse Reaction   The dose of Velcade was reduced due to thrombocytopenia.   01/28/2014 - 04/07/2014 Chemotherapy   Revlimid is added. Treatment was discontinued due to lack of response.   02/24/2014 - 04/07/2014 Chemotherapy   Due to worsening  peripheral neuropathy, Velcade injection is changed to once a week. Revlimid was given 21 days on, 7 days off.   04/07/2014 - 04/10/2014 Chemotherapy   Revlimid was discontinued due to lack of response. Chemotherapy was changed back to Velcade injection twice a week, 2 weeks on 1 week off. Her treatment was switched to to minimum response   04/20/2014 - 06/02/2014 Chemotherapy   chemotherapy is switched to Carfilzomib, Cytoxan and dexamethasone.   04/22/2014 Procedure   she has placement of port for chemotherapy.   06/01/2014 Tumor Marker   Bloodwork show that she has greater than partial response   06/23/2014 Bone Marrow Biopsy   Bone marrow biopsy show 5-10% residual plasma cells, normal cytogenetics and FISH   07/07/2014 Procedure   She had stem cell collection   07/22/2014 - 07/22/2014 Chemotherapy   She  had high-dose chemotherapy with melphalan   07/23/2014 Bone Marrow Transplant   She had bone marrow transplant in autologous fashion at [facility]   10/20/2014 - 03/24/2015 Chemotherapy    she received chemotherapy with Kyprolis, Revlimid and dexamethasone   10/22/2014 Procedure   She has port placement   01/19/2015 Tumor Marker   IgA lambda M spike at 0.4 g    01/20/2015 Miscellaneous   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 No cource was found and fever resolved with IV vancomycin and meropenem   09/13/2015 Bone Marrow Biopsy   Bone marrow biopsy showed no increased blasts, 3-4 % plasma cells   03/02/2016 Bone Marrow Biopsy   Bone marrow biopsy at [facility] showed normocellular (30%) bone marrow with trilineage hematopoiesis. No significant increase in blasts. No significant increase in plasma cells.   05/12/2016 Imaging   DEXA scan at [facility] showed osteopenia   10/24/2016 Imaging   Skeletal survey at [facility], no new lesions   12/07/2017 Imaging   No focal abnormality noted to suggest myeloma. Exam is stable from prior exam.   03/01/2018 Procedure   Successful 8 French right internal jugular vein power port placement with its tip at the SVC/RA junction.   03/06/2018 -  Chemotherapy   The patient had daratumumab    07/09/2018 Bone Marrow Biopsy   Bone marrow biopsy at [facility] showed residual disease at 0.004% plasma cells   07/03/2019 Bone Marrow Biopsy   A. Bone marrow, flow cytometric analysis for multiple myeloma minimal residual disease detection:    Negative. No phenotypically abnormal plasma cells at or above the limit of detection identified.     No monotypic B-cell population identified. Negative for increased blasts.   03/25/2021 Bone Marrow Biopsy   Repeat bone marrow biopsy at [place] showed female donor karyotype.  5 to 7% plasma cell is seen.  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   04/20/2021 Echocardiogram   1. Left ventricular ejection fraction, by estimation, is 60 to 65%. The left ventricle has normal function. The left  ventricle has no regional wall motion abnormalities. Left ventricular diastolic parameters were normal.  2. Right ventricular systolic function is normal. The right ventricular size is normal.  3. The mitral valve is normal in structure. No evidence of mitral valve regurgitation. No evidence of mitral stenosis.  4. The aortic valve is normal in structure. Aortic valve regurgitation is not visualized. No aortic stenosis is present.  5. The inferior vena cava is normal in size with greater than 50% respiratory variability, suggesting right atrial pressure of 3 mmHg.   04/29/2021 - 02/17/2022 Chemotherapy   Patient is on Treatment Plan : MYELOMA RELAPSED/ REFRACTORY Carfilzomib D1,8,15 (20/27) + Dexamethasone (KPd) q28d     04/30/2021 -  Chemotherapy   Patient is on Treatment Plan : MYELOMA RELAPSED/REFRACTORY Carfilzomib (20/70) D1,8,15 + Dexamethasone weekly (40) (Kd) q28d  x 9 cycles / Dexamethasone D1,8,15     05/04/2021 PET scan   1. No evidence active multiple myeloma within the skeleton on FDG PET scan. 2. No suspicious lytic lesion on CT portion exam. 3. No plasmacytoma.   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   10/31/2015 Miscellaneous   She has engrafted     PHYSICAL EXAMINATION: ECOG PERFORMANCE STATUS: 1 - Symptomatic but completely ambulatory  Vitals:   03/28/22 0828  BP: (!) 144/58  Pulse: 78  Resp: 18  Temp: 97.7 F (36.5 C)  SpO2: 100%   Filed Weights   03/28/22 0828  Weight: 165 lb 12.8 oz (75.2 kg)    GENERAL:alert, no distress and comfortable NEURO: alert & oriented x 3 with fluent speech, no focal motor/sensory deficits  LABORATORY DATA:  I have reviewed the data as listed    Component Value Date/Time   NA 140 03/28/2022 0805   NA 142 01/04/2017 0902   K 3.7 03/28/2022 0805   K 3.9 01/04/2017 0902   CL 106 03/28/2022 0805   CO2 27 03/28/2022 0805   CO2 28 01/04/2017 0902   GLUCOSE 125 (H) 03/28/2022 0805   GLUCOSE 92 01/04/2017 0902   BUN 15 03/28/2022 0805   BUN 9.5 01/04/2017 0902   CREATININE 0.78 03/28/2022 0805   CREATININE 0.7 01/04/2017 0902   CALCIUM 8.7 (L) 03/28/2022 0805   CALCIUM 9.0 01/04/2017 0902   PROT 6.2 (L) 03/28/2022 0805   PROT 6.0 (L) 01/04/2017 0902   PROT 5.8 (L) 01/04/2017 0902   ALBUMIN 3.8 03/28/2022 0805   ALBUMIN 3.4 (L) 01/04/2017 0902   AST 11 (L) 03/28/2022 0805   AST 21 01/04/2017 0902   ALT 7 03/28/2022 0805   ALT 16 01/04/2017 0902   ALKPHOS 69 03/28/2022 0805   ALKPHOS 101 01/04/2017 0902   BILITOT 0.3 03/28/2022 0805   BILITOT 0.56 01/04/2017 0902   GFRNONAA >60 03/28/2022 0805   GFRAA >60 03/25/2020 0917   GFRAA >60 12/18/2018 0901    No results  found for: "SPEP", "UPEP"  Lab Results  Component Value Date   WBC 5.9 03/28/2022   NEUTROABS 4.3 03/28/2022  HGB 10.6 (L) 03/28/2022   HCT 32.9 (L) 03/28/2022   MCV 99.7 03/28/2022   PLT 253 03/28/2022      Chemistry      Component Value Date/Time   NA 140 03/28/2022 0805   NA 142 01/04/2017 0902   K 3.7 03/28/2022 0805   K 3.9 01/04/2017 0902   CL 106 03/28/2022 0805   CO2 27 03/28/2022 0805   CO2 28 01/04/2017 0902   BUN 15 03/28/2022 0805   BUN 9.5 01/04/2017 0902   CREATININE 0.78 03/28/2022 0805   CREATININE 0.7 01/04/2017 0902      Component Value Date/Time   CALCIUM 8.7 (L) 03/28/2022 0805   CALCIUM 9.0 01/04/2017 0902   ALKPHOS 69 03/28/2022 0805   ALKPHOS 101 01/04/2017 0902   AST 11 (L) 03/28/2022 0805   AST 21 01/04/2017 0902   ALT 7 03/28/2022 0805   ALT 16 01/04/2017 0902   BILITOT 0.3 03/28/2022 0805   BILITOT 0.56 01/04/2017 0902

## 2022-03-28 NOTE — Patient Instructions (Signed)
[facility]  Discharge Instructions: Thank you for choosing [facility] to provide your oncology and hematology care.   If you have a lab appointment with the [facility], please go directly to the [facility] and check in at the registration area.   Wear comfortable clothing and clothing appropriate for easy access to any Portacath or PICC line.   We strive to give you quality time with your provider. You may need to reschedule your appointment if you arrive late (15 or more minutes).  Arriving late affects you and other patients whose appointments are after yours.  Also, if you miss three or more appointments without notifying the office, you may be dismissed from the clinic at the provider's discretion.      For prescription refill requests, have your pharmacy contact our office and allow 72 hours for refills to be completed.    Today you received the following chemotherapy and/or immunotherapy agent: Carfilzomib (Kyprolis)   To help prevent nausea and vomiting after your treatment, we encourage you to take your nausea medication as directed.  BELOW ARE SYMPTOMS THAT SHOULD BE REPORTED IMMEDIATELY: *FEVER GREATER THAN 100.4 F (38 C) OR HIGHER *CHILLS OR SWEATING *NAUSEA AND VOMITING THAT IS NOT CONTROLLED WITH YOUR NAUSEA MEDICATION *UNUSUAL SHORTNESS OF BREATH *UNUSUAL BRUISING OR BLEEDING *URINARY PROBLEMS (pain or burning when urinating, or frequent urination) *BOWEL PROBLEMS (unusual diarrhea, constipation, pain near the anus) TENDERNESS IN MOUTH AND THROAT WITH OR WITHOUT PRESENCE OF ULCERS (sore throat, sores in mouth, or a toothache) UNUSUAL RASH, SWELLING OR PAIN  UNUSUAL VAGINAL DISCHARGE OR ITCHING   Items with * indicate a potential emergency and should be followed up as soon as possible or go to the Emergency Department if any problems should occur.  Please show the CHEMOTHERAPY ALERT CARD or IMMUNOTHERAPY ALERT CARD at  check-in to the Emergency Department and triage nurse.  Should you have questions after your visit or need to cancel or reschedule your appointment, please contact [facility]  Dept: [phone]  and follow the prompts.  Office hours are 8:00 a.m. to 4:30 p.m. Monday - Friday. Please note that voicemails left after 4:00 p.m. may not be returned until the following business day.  We are closed weekends and major holidays. You have access to a nurse at all times for urgent questions. Please call the main number to the clinic Dept: [phone] and follow the prompts.   For any non-urgent questions, you may also contact your provider using MyChart. We now offer e-Visits for anyone 10 and older to request care online for non-urgent symptoms. For details visit [email or web address].   Also download the MyChart app! Go to the app store, search "MyChart", open the app, select [facility], and log in with your MyChart username and password.  Masks are optional in the cancer centers. If you would like for your care team to wear a mask while they are taking care of you, please let them know. You may have one support person who is at least 76 years old accompany you for your appointments. Carfilzomib Injection What is this medication? CARFILZOMIB (kar FILZ oh mib) treats multiple myeloma, a type of bone marrow cancer. It works by blocking a protein that causes cancer cells to grow and multiply. This helps to slow or stop the spread of cancer cells. This medicine may be used for other purposes; ask your health care provider or pharmacist if you have questions.  COMMON BRAND NAME(S): KYPROLIS What should I tell my care team before I take this medication? They need to know if you have any of these conditions: Heart disease History of blood clots Irregular heartbeat Kidney disease Liver disease Lung or breathing disease An unusual or allergic reaction to carfilzomib, or other  medications, foods, dyes, or preservatives If you or your partner are pregnant or trying to get pregnant Breastfeeding How should I use this medication? This medication is injected into a vein. It is given by your care team in a hospital or clinic setting. Talk to your care team about the use of this medication in children. Special care may be needed. Overdosage: If you think you have taken too much of this medicine contact a poison control center or emergency room at once. NOTE: This medicine is only for you. Do not share this medicine with others. What if I miss a dose? Keep appointments for follow-up doses. It is important not to miss your dose. Call your care team if you are unable to keep an appointment. What may interact with this medication? Interactions are not expected. This list may not describe all possible interactions. Give your health care provider a list of all the medicines, herbs, non-prescription drugs, or dietary supplements you use. Also tell them if you smoke, drink alcohol, or use illegal drugs. Some items may interact with your medicine. What should I watch for while using this medication? Your condition will be monitored carefully while you are receiving this medication. You may need blood work while taking this medication. Check with your care team if you have severe diarrhea, nausea, and vomiting, or if you sweat a lot. The loss of too much body fluid may make it dangerous for you to take this medication. This medication may affect your coordination, reaction time, or judgment. Do not drive or operate machinery until you know how this medication affects you. Sit up or stand slowly to reduce the risk of dizzy or fainting spells. Drinking alcohol with this medication can increase the risk of these side effects. Talk to your care team if you may be pregnant. Serious birth defects can occur if you take this medication during pregnancy and for 6 months after the last dose. You  will need a negative pregnancy test before starting this medication. Contraception is recommended while taking this medication and for 6 months after the last dose. Your care team can help you find an option that works for you. If your partner can get pregnant, use a condom during sex while taking this medication and for 3 months after the last dose. Do not breastfeed while taking this medication and for 2 weeks after the last dose. This medication may cause infertility. Talk to your care team if you are concerned about your fertility. What side effects may I notice from receiving this medication? Side effects that you should report to your care team as soon as possible: Allergic reactions--skin rash, itching, hives, swelling of the face, lips, tongue, or throat Bleeding--bloody or black, tar-like stools, vomiting blood or brown material that looks like coffee grounds, red or dark brown urine, small red or purple spots on skin, unusual bruising or bleeding Blood clot--pain, swelling, or warmth in the leg, shortness of breath, chest pain Dizziness, loss of balance or coordination, confusion or trouble speaking Heart attack--pain or tightness in the chest, shoulders, arms, or jaw, nausea, shortness of breath, cold or clammy skin, feeling faint or lightheaded Heart failure--shortness of breath, swelling of  the ankles, feet, or hands, sudden weight gain, unusual weakness or fatigue Heart rhythm changes--fast or irregular heartbeat, dizziness, feeling faint or lightheaded, chest pain, trouble breathing Increase in blood pressure Infection--fever, chills, cough, sore throat, wounds that don't heal, pain or trouble when passing urine, general feeling of discomfort or being unwell Infusion reactions--chest pain, shortness of breath or trouble breathing, feeling faint or lightheaded Kidney injury--decrease in the amount of urine, swelling of the ankles, hands, or feet Liver injury--right upper belly pain,  loss of appetite, nausea, light-colored stool, dark yellow or brown urine, yellowing skin or eyes, unusual weakness or fatigue Lung injury--shortness of breath or trouble breathing, cough, spitting up blood, chest pain, fever Pulmonary hypertension--shortness of breath, chest pain, fast or irregular heartbeat, feeling faint or lightheaded, fatigue, swelling of the ankles or feet Stomach pain, bloody diarrhea, pale skin, unusual weakness or fatigue, decrease in the amount of urine, which may be signs of hemolytic uremic syndrome Sudden and severe headache, confusion, change in vision, seizures, which may be signs of posterior reversible encephalopathy syndrome (PRES) TTP--purple spots on the skin or inside the mouth, pale skin, yellowing skin or eyes, unusual weakness or fatigue, fever, fast or irregular heartbeat, confusion, change in vision, trouble speaking, trouble walking Tumor lysis syndrome (TLS)--nausea, vomiting, diarrhea, decrease in the amount of urine, dark urine, unusual weakness or fatigue, confusion, muscle pain or cramps, fast or irregular heartbeat, joint pain Side effects that usually do not require medical attention (report to your care team if they continue or are bothersome): Diarrhea Fatigue Nausea Trouble sleeping This list may not describe all possible side effects. Call your doctor for medical advice about side effects. You may report side effects to FDA at 1-800-FDA-1088. Where should I keep my medication? This medication is given in a hospital or clinic. It will not be stored at home. NOTE: This sheet is a summary. It may not cover all possible information. If you have questions about this medicine, talk to your doctor, pharmacist, or health care provider.  2023 Elsevier/Gold Standard (2021-11-09 00:00:00)  

## 2022-04-07 ENCOUNTER — Inpatient Hospital Stay: Payer: Medicare HMO

## 2022-04-07 ENCOUNTER — Other Ambulatory Visit: Payer: Self-pay

## 2022-04-07 VITALS — BP 170/58 | HR 62 | Temp 99.0°F | Resp 18

## 2022-04-07 DIAGNOSIS — Z5111 Encounter for antineoplastic chemotherapy: Secondary | ICD-10-CM | POA: Diagnosis not present

## 2022-04-07 DIAGNOSIS — C9002 Multiple myeloma in relapse: Secondary | ICD-10-CM

## 2022-04-07 LAB — CBC WITH DIFFERENTIAL (CANCER CENTER ONLY)
Abs Immature Granulocytes: 0.02 10*3/uL (ref 0.00–0.07)
Basophils Absolute: 0 10*3/uL (ref 0.0–0.1)
Basophils Relative: 1 %
Eosinophils Absolute: 0.2 10*3/uL (ref 0.0–0.5)
Eosinophils Relative: 3 %
HCT: 33 % — ABNORMAL LOW (ref 36.0–46.0)
Hemoglobin: 10.6 g/dL — ABNORMAL LOW (ref 12.0–15.0)
Immature Granulocytes: 0 %
Lymphocytes Relative: 13 %
Lymphs Abs: 0.8 10*3/uL (ref 0.7–4.0)
MCH: 32.1 pg (ref 26.0–34.0)
MCHC: 32.1 g/dL (ref 30.0–36.0)
MCV: 100 fL (ref 80.0–100.0)
Monocytes Absolute: 0.8 10*3/uL (ref 0.1–1.0)
Monocytes Relative: 12 %
Neutro Abs: 4.7 10*3/uL (ref 1.7–7.7)
Neutrophils Relative %: 71 %
Platelet Count: 233 10*3/uL (ref 150–400)
RBC: 3.3 MIL/uL — ABNORMAL LOW (ref 3.87–5.11)
RDW: 14.1 % (ref 11.5–15.5)
WBC Count: 6.5 10*3/uL (ref 4.0–10.5)
nRBC: 0 % (ref 0.0–0.2)

## 2022-04-07 LAB — CMP (CANCER CENTER ONLY)
ALT: 9 U/L (ref 0–44)
AST: 15 U/L (ref 15–41)
Albumin: 3.9 g/dL (ref 3.5–5.0)
Alkaline Phosphatase: 63 U/L (ref 38–126)
Anion gap: 6 (ref 5–15)
BUN: 18 mg/dL (ref 8–23)
CO2: 27 mmol/L (ref 22–32)
Calcium: 8.9 mg/dL (ref 8.9–10.3)
Chloride: 107 mmol/L (ref 98–111)
Creatinine: 0.76 mg/dL (ref 0.44–1.00)
GFR, Estimated: >60 mL/min (ref >60)
Glucose, Bld: 104 mg/dL — ABNORMAL HIGH (ref 70–99)
Potassium: 4 mmol/L (ref 3.5–5.1)
Sodium: 140 mmol/L (ref 135–145)
Total Bilirubin: 0.3 mg/dL (ref 0.3–1.2)
Total Protein: 6.7 g/dL (ref 6.5–8.1)

## 2022-04-07 MED ORDER — DEXTROSE 5 % IV SOLN
34.0000 mg/m2 | Freq: Once | INTRAVENOUS | Status: AC
  Administered 2022-04-07: 60 mg via INTRAVENOUS
  Filled 2022-04-07: qty 30

## 2022-04-07 MED ORDER — SODIUM CHLORIDE 0.9% FLUSH
10.0000 mL | INTRAVENOUS | Status: DC | PRN
  Administered 2022-04-07: 10 mL

## 2022-04-07 MED ORDER — SODIUM CHLORIDE 0.9 % IV SOLN: Freq: Once | INTRAVENOUS | Status: AC

## 2022-04-07 MED ORDER — HEPARIN SOD (PORK) LOCK FLUSH 100 UNIT/ML IV SOLN
500.0000 [IU] | Freq: Once | INTRAVENOUS | Status: AC | PRN
  Administered 2022-04-07: 500 [IU]

## 2022-04-07 NOTE — Patient Instructions (Signed)
[facility]  Discharge Instructions: Thank you for choosing [facility] to provide your oncology and hematology care.   If you have a lab appointment with the Cancer Center, please go directly to the Cancer Center and check in at the registration area.   Wear comfortable clothing and clothing appropriate for easy access to any Portacath or PICC line.   We strive to give you quality time with your provider. You may need to reschedule your appointment if you arrive late (15 or more minutes).  Arriving late affects you and other patients whose appointments are after yours.  Also, if you miss three or more appointments without notifying the office, you may be dismissed from the clinic at the provider's discretion.      For prescription refill requests, have your pharmacy contact our office and allow 72 hours for refills to be completed.    Today you received the following chemotherapy and/or immunotherapy agents kyprolis      To help prevent nausea and vomiting after your treatment, we encourage you to take your nausea medication as directed.  BELOW ARE SYMPTOMS THAT SHOULD BE REPORTED IMMEDIATELY: *FEVER GREATER THAN 100.4 F (38 C) OR HIGHER *CHILLS OR SWEATING *NAUSEA AND VOMITING THAT IS NOT CONTROLLED WITH YOUR NAUSEA MEDICATION *UNUSUAL SHORTNESS OF BREATH *UNUSUAL BRUISING OR BLEEDING *URINARY PROBLEMS (pain or burning when urinating, or frequent urination) *BOWEL PROBLEMS (unusual diarrhea, constipation, pain near the anus) TENDERNESS IN MOUTH AND THROAT WITH OR WITHOUT PRESENCE OF ULCERS (sore throat, sores in mouth, or a toothache) UNUSUAL RASH, SWELLING OR PAIN  UNUSUAL VAGINAL DISCHARGE OR ITCHING   Items with * indicate a potential emergency and should be followed up as soon as possible or go to the Emergency Department if any problems should occur.  Please show the CHEMOTHERAPY ALERT CARD or IMMUNOTHERAPY ALERT CARD at check-in to  the Emergency Department and triage nurse.  Should you have questions after your visit or need to cancel or reschedule your appointment, please contact [facility]  Dept: [phone]  and follow the prompts.  Office hours are 8:00 a.m. to 4:30 p.m. Monday - Friday. Please note that voicemails left after 4:00 p.m. may not be returned until the following business day.  We are closed weekends and major holidays. You have access to a nurse at all times for urgent questions. Please call the main number to the clinic Dept: [phone] and follow the prompts.   For any non-urgent questions, you may also contact your provider using MyChart. We now offer e-Visits for anyone 18 and older to request care online for non-urgent symptoms. For details visit [email or web address].   Also download the MyChart app! Go to the app store, search "MyChart", open the app, select [facility], and log in with your MyChart username and password.  Masks are optional in the cancer centers. If you would like for your care team to wear a mask while they are taking care of you, please let them know. You may have one support person who is at least 76 years old accompany you for your appointments. 

## 2022-04-10 ENCOUNTER — Other Ambulatory Visit: Payer: Self-pay

## 2022-04-10 ENCOUNTER — Encounter: Payer: Self-pay | Admitting: Hematology and Oncology

## 2022-04-10 ENCOUNTER — Inpatient Hospital Stay: Payer: Medicare HMO | Admitting: Hematology and Oncology

## 2022-04-10 ENCOUNTER — Telehealth: Payer: Self-pay | Admitting: *Deleted

## 2022-04-10 VITALS — BP 128/72 | HR 79 | Temp 97.4°F | Resp 18 | Ht 62.0 in | Wt 164.2 lb

## 2022-04-10 DIAGNOSIS — Z5111 Encounter for antineoplastic chemotherapy: Secondary | ICD-10-CM | POA: Diagnosis not present

## 2022-04-10 DIAGNOSIS — D801 Nonfamilial hypogammaglobulinemia: Secondary | ICD-10-CM

## 2022-04-10 DIAGNOSIS — C9002 Multiple myeloma in relapse: Secondary | ICD-10-CM

## 2022-04-10 DIAGNOSIS — J069 Acute upper respiratory infection, unspecified: Secondary | ICD-10-CM | POA: Diagnosis not present

## 2022-04-10 MED ORDER — PREDNISONE 20 MG PO TABS: 20.0000 mg | ORAL_TABLET | Freq: Every day | ORAL | 0 refills | Status: DC

## 2022-04-10 NOTE — Assessment & Plan Note (Signed)
She has recurrent infection We discussed the role of IVIG She is in agreement to proceed

## 2022-04-10 NOTE — Assessment & Plan Note (Signed)
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

## 2022-04-10 NOTE — Telephone Encounter (Signed)
Patient called - has had terrible upper respiratory symptoms all weekend. She said she thinks she needs a chest x ray and has been trying to reach her PCP. She asked if there was someone she could see at the [facility] - she'd heard there a provider here who saw patients for non-cancer reasons. Dr. Alvy Bimler informed of patient concern - per MD, cancellation on her schedule for this morning and can see her now if patient wants/is able to come in. Contacted patient and she states she will come for appt thi8s am.

## 2022-04-10 NOTE — Assessment & Plan Note (Signed)
She has recurrent URI Her symptoms are most suspicious for parainfluenza infection She declined antibiotics coverage I will give her low-dose prednisone therapy

## 2022-04-10 NOTE — Progress Notes (Signed)
[place] OFFICE PROGRESS NOTE  Patient Care Team: Default, Provider, MD as PCP - General Fletcher Anon Mertie Clause, MD as PCP - Cardiology (Cardiology) [name], MD as Referring Physician (Hematology and Oncology) [name], MD as Consulting Physician (Internal Medicine) [name], [name], MD as Consulting Physician (Infectious Diseases) [name], MD as Consulting Physician (Hematology and Oncology) [name], NP as Nurse Practitioner (Hematology and Oncology) [name], MD as Consulting Physician (Hematology and Oncology)  ASSESSMENT & PLAN:  Multiple myeloma in relapse [facility]) I was not able to access information from recent visit at [facility] According to the patient, she is awaiting insurance approval for CAR-T cell treatment at [facility] We will continue her chemotherapy as scheduled Due to infection, 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   All questions were answered. The patient knows to call the clinic with any problems, questions or concerns. The total time spent in the appointment was 30 minutes encounter with patients including review of chart and various tests results, discussions about plan of care and coordination of care plan   [name], MD 04/10/2022 11:37 AM  INTERVAL HISTORY: Please see below for problem oriented charting. she returns for urgent evaluation due to URI symptoms She started to have voice changes, loose stool, sinus drainage and sore throat over the weekend She denies fevers We discussed  treatment plan and follow-up and role of IVIG  REVIEW OF SYSTEMS:   Constitutional: Denies fevers, chills or abnormal weight loss Cardiovascular: Denies palpitation, chest discomfort or lower extremity swelling Skin: Denies abnormal skin rashes Lymphatics: Denies new lymphadenopathy or easy bruising Neurological:Denies numbness, tingling or new weaknesses Behavioral/Psych: Mood is stable, no new changes  All other systems were reviewed with the patient and are negative.  I have reviewed the past medical history, past surgical history, social history and family history with the patient and they are unchanged from previous note.  ALLERGIES:  has No Known Allergies.  MEDICATIONS:  Current Outpatient Medications  Medication Sig Dispense Refill   predniSONE (DELTASONE) 20 MG tablet Take 1 tablet (20 mg total) by mouth daily with breakfast. 10 tablet 0   acyclovir (ZOVIRAX) 400 MG tablet TAKE 1 TABLET BY MOUTH TWICE A DAY 180 tablet 11   aspirin 325 MG tablet Take 325 mg by mouth daily.     calcium carbonate (TUMS - DOSED IN MG ELEMENTAL CALCIUM) 500 MG chewable tablet Chew 1 tablet by mouth daily.     Carfilzomib (KYPROLIS IV) Inject into the vein once a week.     Cholecalciferol 25 MCG (1000 UT) tablet Take 1,000 Units by mouth daily.     Cyanocobalamin (VITAMIN B12 PO) Take 1 tablet by mouth daily.     dexamethasone (DECADRON) 4 MG tablet Take 1 tablet (4 mg total) by mouth once a week. Take 1 tablet by mouth 1 hour before chemotherapy 5 tablet 5   loperamide (IMODIUM) 2 MG capsule Take by mouth as needed for diarrhea or loose stools. As needed only     metoprolol tartrate (LOPRESSOR) 25 MG tablet TAKE 1 & 1/2 TABLET BY MOUTH TWICE A DAY  270 tablet 0   mirtazapine (REMERON) 7.5 MG tablet Take 1 tablet (7.5 mg total) by mouth at bedtime. 90 tablet 1   Multiple Vitamin (MULTIVITAMIN WITH MINERALS) TABS tablet Take 1 tablet by mouth daily.     Multiple Vitamins-Minerals (PRESERVISION AREDS 2  PO) Take 1 tablet by mouth 2 (two) times daily.     ondansetron (ZOFRAN) 8 MG tablet Take 1 tablet (8 mg total) by mouth every 8 (eight) hours as needed (Nausea or vomiting). 30 tablet 1   pantoprazole (PROTONIX) 40 MG tablet TAKE 1 TABLET BY MOUTH 2 TIMES DAILY. TAKE 30-60 MINUTES BEFORE BREAKFAST AND DINNER. 180 tablet 2   No current facility-administered medications for this visit.    SUMMARY OF ONCOLOGIC HISTORY: Oncology History Overview Note  Multiple myeloma, Ig A Lambda, M spike 3.54 grams, Calcium 9.2, Creatinine 0.8, Beta 2 microglobulin 4.52, IgA 4840 mg/dL, lambda light chain 75.4, albumin 3.6, hemoglobin 9.7, platelet 115    Primary site: Multiple Myeloma   Staging method: AJCC 6th Edition   Clinical: Stage IIA signed by [name], MD on 11/07/2013  2:46 PM   Summary: Stage IIA S/p Allo transplant at [facility] on revlimid, pomalyst, daratumumab and velcade     Multiple myeloma in relapse ([place])  10/31/2013 Bone Marrow Biopsy   Bone marrow biopsy confirmed multiple myeloma with 40% bone marrow involvement. Skeletal survey showed minimal lesions in her score with generalized demineralization   11/10/2013 - 02/13/2014 Chemotherapy   The patient is started on induction chemotherapy with weekly dexamethasone 40 mg by mouth as well as Velcade subcutaneous injection on days 1, 4, 8 and 11. On 11/21/2013, she was started on monthly Zometa.   12/23/2013 Adverse Reaction   The dose of Velcade was reduced due to thrombocytopenia.   01/28/2014 - 04/07/2014 Chemotherapy   Revlimid is added. Treatment was discontinued due to lack of response.   02/24/2014 - 04/07/2014 Chemotherapy   Due to worsening peripheral neuropathy, Velcade injection is changed to once a week. Revlimid was given 21 days on, 7 days off.   04/07/2014 - 04/10/2014 Chemotherapy   Revlimid was discontinued due to lack of response. Chemotherapy was changed back to Velcade injection twice a week, 2 weeks on 1 week off.  Her treatment was switched to to minimum response   04/20/2014 - 06/02/2014 Chemotherapy   chemotherapy is switched to Carfilzomib, Cytoxan and dexamethasone.   04/22/2014 Procedure   she has placement of port for chemotherapy.   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 at [facility]   10/20/2014 - 03/24/2015 Chemotherapy    she received chemotherapy with Kyprolis, Revlimid and dexamethasone   10/22/2014 Procedure   She has port placement   01/19/2015 Tumor Marker   IgA lambda M spike at 0.4 g    01/20/2015 Miscellaneous   IVIG monthly was added for recurrent infections   02/02/2015 Miscellaneous   She received GCSF for severe neutropenia   02/26/2015 Bone Marrow Biopsy    she had bone marrow biopsy done at [facility] which showed mild pancytopenia but not diagnostic for myelodysplastic syndrome or multiple  myeloma   07/22/2015 - 09/21/2015 Chemotherapy   She is receiving Daratumumab at [place] due to relapsed myeloma   08/03/2015 - 08/06/2015 Hospital Admission   She was admitted to the hospital for neutropenic fever. No cource was found and fever resolved with IV vancomycin and meropenem   09/13/2015 Bone Marrow Biopsy   Bone marrow biopsy showed no increased blasts, 3-4 % plasma cells   03/02/2016 Bone Marrow Biopsy   Bone marrow biopsy at [facility] showed normocellular (30%) bone marrow with trilineage hematopoiesis. No significant increase in blasts. No significant increase in plasma cells.   05/12/2016 Imaging   DEXA scan at [place] showed osteopenia   10/24/2016 Imaging   Skeletal survey at [facility], no new lesions    12/07/2017 Imaging   No focal abnormality noted to suggest myeloma. Exam is stable from prior exam.   03/01/2018 Procedure   Successful 8 French right internal jugular vein power port placement with its tip at the SVC/RA junction.   03/06/2018 -  Chemotherapy   The patient had daratumumab    07/09/2018 Bone Marrow Biopsy   Bone marrow biopsy at [facility] showed residual disease at 0.004% plasma cells   07/03/2019 Bone Marrow Biopsy   A. Bone marrow, flow cytometric analysis for multiple myeloma minimal residual disease detection:   Negative. No phenotypically abnormal plasma cells at or above the limit of detection identified.     No monotypic B-cell population identified. Negative for increased blasts.   03/25/2021 Bone Marrow Biopsy   Repeat bone marrow biopsy at [place] showed female donor karyotype.  5 to 7% plasma cell is seen.  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   04/20/2021 Echocardiogram   1. Left ventricular ejection fraction, by estimation, is 60 to 65%. The left ventricle has normal function. The left ventricle has no regional wall motion abnormalities. Left ventricular diastolic parameters were normal.  2. Right ventricular systolic function is normal. The right ventricular size is normal.  3. The mitral valve is normal in structure. No evidence of mitral valve regurgitation. No evidence of mitral stenosis.  4. The aortic valve is normal in structure. Aortic valve regurgitation is not visualized. No aortic stenosis is present.  5. The inferior vena cava is normal in size with greater than 50% respiratory variability, suggesting right atrial pressure of 3 mmHg.   04/29/2021 - 02/17/2022 Chemotherapy   Patient is on Treatment Plan : MYELOMA RELAPSED/ REFRACTORY Carfilzomib D1,8,15 (20/27) + Dexamethasone (KPd) q28d      04/30/2021 -  Chemotherapy   Patient is on Treatment Plan : MYELOMA RELAPSED/REFRACTORY Carfilzomib (20/70) D1,8,15 + Dexamethasone weekly (40) (Kd) q28d  x 9 cycles / Dexamethasone D1,8,15     05/04/2021 PET scan   1. No evidence active multiple myeloma within the skeleton on FDG PET scan. 2. No suspicious lytic lesion on CT portion exam. 3. No plasmacytoma.   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   10/31/2015 Miscellaneous   She has engrafted     PHYSICAL EXAMINATION: ECOG PERFORMANCE STATUS: 1 - Symptomatic but completely ambulatory  Vitals:   04/10/22 1058  BP: 128/72  Pulse: 79  Resp: 18  Temp: (!) 97.4 F (36.3 C)  SpO2: 98%   Filed Weights   04/10/22 1058  Weight: 164 lb 3.2 oz (74.5 kg)    GENERAL:alert, no distress and comfortable SKIN: skin color, texture, turgor are normal, no rashes or significant lesions EYES: normal, Conjunctiva are pink and non-injected, sclera clear OROPHARYNX:no exudate, no erythema and lips, buccal mucosa, and tongue normal  NECK: supple, thyroid normal size, non-tender, without nodularity LYMPH:  no palpable lymphadenopathy in the cervical, axillary or inguinal LUNGS: She has diffuse expiratory wheezes HEART: regular rate & rhythm and no murmurs and no lower extremity edema ABDOMEN:abdomen soft, non-tender and normal bowel  sounds Musculoskeletal:no cyanosis of digits and no clubbing  NEURO: alert & oriented x 3 with fluent speech, no focal motor/sensory deficits  LABORATORY DATA:  I have reviewed the data as listed    Component Value Date/Time   NA 140 04/07/2022 0741   NA 142 01/04/2017 0902   K 4.0 04/07/2022 0741   K 3.9 01/04/2017 0902   CL 107 04/07/2022 0741   CO2 27 04/07/2022 0741   CO2 28 01/04/2017 0902   GLUCOSE 104 (H) 04/07/2022 0741   GLUCOSE 92 01/04/2017 0902   BUN 18 04/07/2022 0741   BUN 9.5 01/04/2017 0902   CREATININE 0.76 04/07/2022 0741   CREATININE 0.7 01/04/2017 0902   CALCIUM 8.9 04/07/2022 0741   CALCIUM 9.0 01/04/2017 0902   PROT 6.7 04/07/2022 0741   PROT 6.0 (L) 01/04/2017 0902   PROT 5.8 (L) 01/04/2017 0902   ALBUMIN 3.9 04/07/2022 0741   ALBUMIN 3.4 (L) 01/04/2017 0902   AST 15 04/07/2022 0741   AST 21 01/04/2017 0902   ALT 9 04/07/2022 0741   ALT 16 01/04/2017 0902   ALKPHOS 63 04/07/2022 0741   ALKPHOS 101 01/04/2017 0902   BILITOT 0.3 04/07/2022 0741   BILITOT 0.56 01/04/2017 0902   GFRNONAA >60 04/07/2022 0741   GFRAA >60 03/25/2020 0917   GFRAA >60 12/18/2018 0901    No results found for: "SPEP", "UPEP"  Lab Results  Component Value Date   WBC 6.5 04/07/2022   NEUTROABS 4.7 04/07/2022   HGB 10.6 (L) 04/07/2022   HCT 33.0 (L) 04/07/2022   MCV 100.0 04/07/2022   PLT 233 04/07/2022      Chemistry      Component Value Date/Time   NA 140 04/07/2022 0741   NA 142 01/04/2017 0902   K 4.0 04/07/2022 0741   K 3.9 01/04/2017 0902   CL 107 04/07/2022 0741   CO2 27 04/07/2022 0741   CO2 28 01/04/2017 0902   BUN 18 04/07/2022 0741   BUN 9.5 01/04/2017 0902   CREATININE 0.76 04/07/2022 0741   CREATININE 0.7 01/04/2017 0902      Component Value Date/Time   CALCIUM 8.9 04/07/2022 0741   CALCIUM 9.0 01/04/2017 0902   ALKPHOS 63 04/07/2022 0741   ALKPHOS 101 01/04/2017 0902   AST 15 04/07/2022 0741   AST 21 01/04/2017 0902   ALT 9 04/07/2022  0741   ALT 16 01/04/2017 0902   BILITOT 0.3 04/07/2022 0741   BILITOT 0.56 01/04/2017 0902

## 2022-04-11 ENCOUNTER — Telehealth: Payer: Self-pay | Admitting: Hematology and Oncology

## 2022-04-11 NOTE — Telephone Encounter (Signed)
Called patient to update her on the time in which her appointment was scheduled for 7 and not 3 hours. Patient is aware of the changes made to her upcoming appointment.

## 2022-04-14 ENCOUNTER — Inpatient Hospital Stay: Payer: Medicare HMO

## 2022-04-14 VITALS — BP 158/63 | HR 63 | Temp 99.5°F | Resp 18 | Ht 62.0 in | Wt 164.8 lb

## 2022-04-14 DIAGNOSIS — C9002 Multiple myeloma in relapse: Secondary | ICD-10-CM

## 2022-04-14 DIAGNOSIS — D469 Myelodysplastic syndrome, unspecified: Secondary | ICD-10-CM

## 2022-04-14 DIAGNOSIS — Z5111 Encounter for antineoplastic chemotherapy: Secondary | ICD-10-CM | POA: Diagnosis not present

## 2022-04-14 LAB — CBC WITH DIFFERENTIAL (CANCER CENTER ONLY)
Abs Immature Granulocytes: 0.02 10*3/uL (ref 0.00–0.07)
Basophils Absolute: 0 10*3/uL (ref 0.0–0.1)
Basophils Relative: 0 %
Eosinophils Absolute: 0 10*3/uL (ref 0.0–0.5)
Eosinophils Relative: 0 %
HCT: 31.2 % — ABNORMAL LOW (ref 36.0–46.0)
Hemoglobin: 9.9 g/dL — ABNORMAL LOW (ref 12.0–15.0)
Immature Granulocytes: 1 %
Lymphocytes Relative: 28 %
Lymphs Abs: 0.7 10*3/uL (ref 0.7–4.0)
MCH: 31.7 pg (ref 26.0–34.0)
MCHC: 31.7 g/dL (ref 30.0–36.0)
MCV: 100 fL (ref 80.0–100.0)
Monocytes Absolute: 0.5 10*3/uL (ref 0.1–1.0)
Monocytes Relative: 19 %
Neutro Abs: 1.3 10*3/uL — ABNORMAL LOW (ref 1.7–7.7)
Neutrophils Relative %: 52 %
Platelet Count: 153 10*3/uL (ref 150–400)
RBC: 3.12 MIL/uL — ABNORMAL LOW (ref 3.87–5.11)
RDW: 13.7 % (ref 11.5–15.5)
Smear Review: NORMAL
WBC Count: 2.6 10*3/uL — ABNORMAL LOW (ref 4.0–10.5)
nRBC: 0 % (ref 0.0–0.2)

## 2022-04-14 LAB — CMP (CANCER CENTER ONLY)
ALT: 14 U/L (ref 0–44)
AST: 23 U/L (ref 15–41)
Albumin: 3.6 g/dL (ref 3.5–5.0)
Alkaline Phosphatase: 69 U/L (ref 38–126)
Anion gap: 5 (ref 5–15)
BUN: 21 mg/dL (ref 8–23)
CO2: 28 mmol/L (ref 22–32)
Calcium: 8.5 mg/dL — ABNORMAL LOW (ref 8.9–10.3)
Chloride: 108 mmol/L (ref 98–111)
Creatinine: 0.74 mg/dL (ref 0.44–1.00)
GFR, Estimated: >60 mL/min (ref >60)
Glucose, Bld: 94 mg/dL (ref 70–99)
Potassium: 3.7 mmol/L (ref 3.5–5.1)
Sodium: 141 mmol/L (ref 135–145)
Total Bilirubin: 0.2 mg/dL — ABNORMAL LOW (ref 0.3–1.2)
Total Protein: 6.4 g/dL — ABNORMAL LOW (ref 6.5–8.1)

## 2022-04-14 MED ORDER — DEXTROSE 5 % IV SOLN
34.0000 mg/m2 | Freq: Once | INTRAVENOUS | Status: AC
  Administered 2022-04-14: 60 mg via INTRAVENOUS
  Filled 2022-04-14: qty 30

## 2022-04-14 MED ORDER — HEPARIN SOD (PORK) LOCK FLUSH 100 UNIT/ML IV SOLN
500.0000 [IU] | Freq: Once | INTRAVENOUS | Status: AC | PRN
  Administered 2022-04-14: 500 [IU]

## 2022-04-14 MED ORDER — SODIUM CHLORIDE 0.9% FLUSH
10.0000 mL | Freq: Once | INTRAVENOUS | Status: AC
  Administered 2022-04-14: 10 mL

## 2022-04-14 MED ORDER — SODIUM CHLORIDE 0.9 % IV SOLN: Freq: Once | INTRAVENOUS | Status: DC

## 2022-04-14 MED ORDER — SODIUM CHLORIDE 0.9 % IV SOLN: Freq: Once | INTRAVENOUS | Status: AC

## 2022-04-14 MED ORDER — SODIUM CHLORIDE 0.9% FLUSH
10.0000 mL | INTRAVENOUS | Status: DC | PRN
  Administered 2022-04-14: 10 mL

## 2022-04-14 NOTE — Patient Instructions (Signed)
[facility] MEDICAL ONCOLOGY  Discharge Instructions: Thank you for choosing [facility] to provide your oncology and hematology care.   If you have a lab appointment with the Cancer Center, please go directly to the Cancer Center and check in at the registration area.   Wear comfortable clothing and clothing appropriate for easy access to any Portacath or PICC line.   We strive to give you quality time with your provider. You may need to reschedule your appointment if you arrive late (15 or more minutes).  Arriving late affects you and other patients whose appointments are after yours.  Also, if you miss three or more appointments without notifying the office, you may be dismissed from the clinic at the provider's discretion.      For prescription refill requests, have your pharmacy contact our office and allow 72 hours for refills to be completed.    Today you received the following chemotherapy and/or immunotherapy agents: Carfilzomib (Kyprolis)     To help prevent nausea and vomiting after your treatment, we encourage you to take your nausea medication as directed.  BELOW ARE SYMPTOMS THAT SHOULD BE REPORTED IMMEDIATELY: *FEVER GREATER THAN 100.4 F (38 C) OR HIGHER *CHILLS OR SWEATING *NAUSEA AND VOMITING THAT IS NOT CONTROLLED WITH YOUR NAUSEA MEDICATION *UNUSUAL SHORTNESS OF BREATH *UNUSUAL BRUISING OR BLEEDING *URINARY PROBLEMS (pain or burning when urinating, or frequent urination) *BOWEL PROBLEMS (unusual diarrhea, constipation, pain near the anus) TENDERNESS IN MOUTH AND THROAT WITH OR WITHOUT PRESENCE OF ULCERS (sore throat, sores in mouth, or a toothache) UNUSUAL RASH, SWELLING OR PAIN  UNUSUAL VAGINAL DISCHARGE OR ITCHING   Items with * indicate a potential emergency and should be followed up as soon as possible or go to the Emergency Department if any problems should occur.  Please show the CHEMOTHERAPY ALERT CARD or IMMUNOTHERAPY ALERT CARD at  check-in to the Emergency Department and triage nurse.  Should you have questions after your visit or need to cancel or reschedule your appointment, please contact [facility]  Dept: [phone]  and follow the prompts.  Office hours are 8:00 a.m. to 4:30 p.m. Monday - Friday. Please note that voicemails left after 4:00 p.m. may not be returned until the following business day.  We are closed weekends and major holidays. You have access to a nurse at all times for urgent questions. Please call the main number to the clinic Dept: [phone] and follow the prompts.   For any non-urgent questions, you may also contact your provider using MyChart. We now offer e-Visits for anyone 18 and older to request care online for non-urgent symptoms. For details visit [email or web address].   Also download the MyChart app! Go to the app store, search "MyChart", open the app, select [facility], and log in with your MyChart username and password.  Masks are optional in the cancer centers. If you would like for your care team to wear a mask while they are taking care of you, please let them know. You may have one support person who is at least 76 years old accompany you for your appointments. 

## 2022-04-14 NOTE — Progress Notes (Signed)
Patient reports taking dexamethasone prior to infusion appointment today.

## 2022-04-14 NOTE — Progress Notes (Signed)
Per Dr. Alvy Bimler, okay to treat with Elliott 1.3.

## 2022-04-17 ENCOUNTER — Inpatient Hospital Stay: Payer: Medicare HMO

## 2022-04-17 ENCOUNTER — Inpatient Hospital Stay (HOSPITAL_BASED_OUTPATIENT_CLINIC_OR_DEPARTMENT_OTHER): Payer: Medicare HMO | Admitting: Hematology and Oncology

## 2022-04-17 ENCOUNTER — Encounter: Payer: Self-pay | Admitting: Hematology and Oncology

## 2022-04-17 VITALS — BP 188/69 | HR 59 | Temp 98.1°F | Resp 17 | Wt 165.1 lb

## 2022-04-17 DIAGNOSIS — D801 Nonfamilial hypogammaglobulinemia: Secondary | ICD-10-CM | POA: Diagnosis not present

## 2022-04-17 DIAGNOSIS — I10 Essential (primary) hypertension: Secondary | ICD-10-CM

## 2022-04-17 DIAGNOSIS — D469 Myelodysplastic syndrome, unspecified: Secondary | ICD-10-CM

## 2022-04-17 DIAGNOSIS — C9002 Multiple myeloma in relapse: Secondary | ICD-10-CM

## 2022-04-17 DIAGNOSIS — E8770 Fluid overload, unspecified: Secondary | ICD-10-CM | POA: Diagnosis not present

## 2022-04-17 DIAGNOSIS — Z5111 Encounter for antineoplastic chemotherapy: Secondary | ICD-10-CM | POA: Diagnosis not present

## 2022-04-17 LAB — KAPPA/LAMBDA LIGHT CHAINS
Kappa free light chain: 19.5 mg/L — ABNORMAL HIGH (ref 3.3–19.4)
Kappa, lambda light chain ratio: 0.13 — ABNORMAL LOW (ref 0.26–1.65)
Lambda free light chains: 152 mg/L — ABNORMAL HIGH (ref 5.7–26.3)

## 2022-04-17 MED ORDER — HEPARIN SOD (PORK) LOCK FLUSH 100 UNIT/ML IV SOLN
500.0000 [IU] | Freq: Once | INTRAVENOUS | Status: AC
  Administered 2022-04-17: 500 [IU] via INTRAVENOUS

## 2022-04-17 MED ORDER — DEXTROSE 5 % IV SOLN: Freq: Once | INTRAVENOUS | Status: AC

## 2022-04-17 MED ORDER — DIPHENHYDRAMINE HCL 25 MG PO CAPS
25.0000 mg | ORAL_CAPSULE | Freq: Once | ORAL | Status: AC
  Administered 2022-04-17: 25 mg via ORAL
  Filled 2022-04-17: qty 1

## 2022-04-17 MED ORDER — ACETAMINOPHEN 325 MG PO TABS
650.0000 mg | ORAL_TABLET | Freq: Once | ORAL | Status: AC
  Administered 2022-04-17: 650 mg via ORAL
  Filled 2022-04-17: qty 2

## 2022-04-17 MED ORDER — FUROSEMIDE 20 MG PO TABS: 20.0000 mg | ORAL_TABLET | Freq: Every day | ORAL | 0 refills | Status: DC

## 2022-04-17 MED ORDER — FUROSEMIDE 10 MG/ML IJ SOLN
20.0000 mg | Freq: Once | INTRAMUSCULAR | Status: AC
  Administered 2022-04-17: 20 mg via INTRAVENOUS
  Filled 2022-04-17: qty 2

## 2022-04-17 MED ORDER — SODIUM CHLORIDE 0.9% FLUSH
10.0000 mL | Freq: Once | INTRAVENOUS | Status: AC
  Administered 2022-04-17: 10 mL via INTRAVENOUS

## 2022-04-17 MED ORDER — IMMUNE GLOBULIN (HUMAN) 10 GM/100ML IV SOLN
1.0000 g/kg | Freq: Once | INTRAVENOUS | Status: AC
  Administered 2022-04-17: 10 g via INTRAVENOUS
  Filled 2022-04-17: qty 750

## 2022-04-17 NOTE — Assessment & Plan Note (Signed)
I suspect she has fluid overload I recommend furosemide and the patient has good diuresis prior to discharge with improvement of her blood pressure control She will continue furosemide daily

## 2022-04-17 NOTE — Assessment & Plan Note (Signed)
The cause of her uncontrolled hypertension is multifactorial, due to poor sleep, stress, fluid retention from prednisone and others I recommend low-dose furosemide She will start prednisone tomorrow We will monitor her blood pressure carefully while on treatment She is not symptomatic

## 2022-04-17 NOTE — Addendum Note (Signed)
Addended byAlvy Bimler, Agapito Hanway on: 04/17/2022 03:01 PM   Modules accepted: Level of Service

## 2022-04-17 NOTE — Progress Notes (Addendum)
[place] OFFICE PROGRESS NOTE  Patient Care Team: Default, Provider, MD as PCP - General Fletcher Anon Mertie Clause, MD as PCP - Cardiology (Cardiology) [name], MD as Referring Physician (Hematology and Oncology) [name], MD as Consulting Physician (Internal Medicine) [name], [name], MD as Consulting Physician (Infectious Diseases) [name], MD as Consulting Physician (Hematology and Oncology) [name], NP as Nurse Practitioner (Hematology and Oncology) [name], MD as Consulting Physician (Hematology and Oncology)  ASSESSMENT & PLAN:  Acquired hypogammaglobulinemia Clinically, she is better She will complete IVIG today We will monitor her blood pressure throughout her treatment  Essential hypertension The cause of her uncontrolled hypertension is multifactorial, due to poor sleep, stress, fluid retention from prednisone and others I recommend low-dose furosemide She will start prednisone tomorrow We will monitor her blood pressure carefully while on treatment She is not symptomatic  Fluid overload I suspect she has fluid overload I recommend furosemide and the patient has good diuresis prior to discharge with improvement of her blood pressure control She will continue furosemide daily  No orders of the defined types were placed in this encounter.   All questions were answered. The patient knows to call the clinic with any problems, questions or concerns. The total time spent in the appointment was 30 minutes encounter with patients including review of chart and various tests results, discussions about plan of care and coordination of care plan   [name], MD 04/17/2022 3:00 PM  INTERVAL HISTORY: Please see below for problem oriented charting. She is evaluated in the infusion room due to high blood pressure noted she returns for treatment follow-up receiving IVIG for acquired hypogammaglobulinemia due  to multiple myeloma and recent infection Overall, her infection is better She denies fever or chills She is not symptomatic from hypertension today She was reevaluated again around 230 and was noted to have fluid overload.  She received furosemide with improvement prior to discharge  REVIEW OF SYSTEMS:   Constitutional: Denies fevers, chills or abnormal weight loss Eyes: Denies blurriness of vision Ears, nose, mouth, throat, and face: Denies mucositis or sore throat Respiratory: Denies cough, dyspnea or wheezes Cardiovascular: Denies palpitation, chest discomfort or lower extremity swelling Gastrointestinal:  Denies nausea, heartburn or change in bowel habits Skin: Denies abnormal skin rashes Lymphatics: Denies new lymphadenopathy or easy bruising Neurological:Denies numbness, tingling or new weaknesses Behavioral/Psych: Mood is stable, no new changes  All other systems were reviewed with the patient and are negative.  I have reviewed the past medical history, past surgical history, social history and family history with the patient and they are unchanged from previous note.  ALLERGIES:  has No Known Allergies.  MEDICATIONS:  Current Outpatient Medications  Medication Sig Dispense Refill   furosemide (LASIX) 20 MG tablet Take 1 tablet (20 mg total) by mouth daily. 10 tablet 0   acyclovir (ZOVIRAX) 400 MG tablet TAKE 1 TABLET BY MOUTH TWICE A DAY 180 tablet 11   aspirin 325 MG tablet Take 325 mg by mouth daily.     calcium carbonate (TUMS - DOSED IN MG ELEMENTAL CALCIUM) 500 MG chewable tablet Chew 1 tablet by mouth daily.     Carfilzomib (KYPROLIS IV) Inject into the vein once a week.     Cholecalciferol 25 MCG (1000 UT) tablet Take 1,000 Units by mouth daily.     Cyanocobalamin (VITAMIN B12 PO) Take 1 tablet by mouth daily.     dexamethasone (DECADRON) 4 MG tablet Take  1 tablet (4 mg total) by mouth once a week. Take 1 tablet by mouth 1 hour before chemotherapy 5 tablet 5    loperamide (IMODIUM) 2 MG capsule Take by mouth as needed for diarrhea or loose stools. As needed only     metoprolol tartrate (LOPRESSOR) 25 MG tablet TAKE 1 & 1/2 TABLET BY MOUTH TWICE A DAY 270 tablet 0   mirtazapine (REMERON) 7.5 MG tablet Take 1 tablet (7.5 mg total) by mouth at bedtime. 90 tablet 1   Multiple Vitamin (MULTIVITAMIN WITH MINERALS) TABS tablet Take 1 tablet by mouth daily.     Multiple Vitamins-Minerals (PRESERVISION AREDS 2 PO) Take 1 tablet by mouth 2 (two) times daily.     ondansetron (ZOFRAN) 8 MG tablet Take 1 tablet (8 mg total) by mouth every 8 (eight) hours as needed (Nausea or vomiting). 30 tablet 1   pantoprazole (PROTONIX) 40 MG tablet TAKE 1 TABLET BY MOUTH 2 TIMES DAILY. TAKE 30-60 MINUTES BEFORE BREAKFAST AND DINNER. 180 tablet 2   No current facility-administered medications for this visit.    SUMMARY OF ONCOLOGIC HISTORY: Oncology History Overview Note  Multiple myeloma, Ig A Lambda, M spike 3.54 grams, Calcium 9.2, Creatinine 0.8, Beta 2 microglobulin 4.52, IgA 4840 mg/dL, lambda light chain 75.4, albumin 3.6, hemoglobin 9.7, platelet 115    Primary site: Multiple Myeloma   Staging method: AJCC 6th Edition   Clinical: Stage IIA signed by [name], MD on 11/07/2013  2:46 PM   Summary: Stage IIA S/p Allo transplant at [facility] on revlimid, pomalyst, daratumumab and velcade     Multiple myeloma in relapse ([place])  10/31/2013 Bone Marrow Biopsy   Bone marrow biopsy confirmed multiple myeloma with 40% bone marrow involvement. Skeletal survey showed minimal lesions in her score with generalized demineralization   11/10/2013 - 02/13/2014 Chemotherapy   The patient is started on induction chemotherapy with weekly dexamethasone 40 mg by mouth as well as Velcade subcutaneous injection on days 1, 4, 8 and 11. On 11/21/2013, she was started on monthly Zometa.   12/23/2013 Adverse Reaction   The dose of Velcade was reduced due to thrombocytopenia.   01/28/2014  - 04/07/2014 Chemotherapy   Revlimid is added. Treatment was discontinued due to lack of response.   02/24/2014 - 04/07/2014 Chemotherapy   Due to worsening peripheral neuropathy, Velcade injection is changed to once a week. Revlimid was given 21 days on, 7 days off.   04/07/2014 - 04/10/2014 Chemotherapy   Revlimid was discontinued due to lack of response. Chemotherapy was changed back to Velcade injection twice a week, 2 weeks on 1 week off. Her treatment was switched to to minimum response   04/20/2014 - 06/02/2014 Chemotherapy   chemotherapy is switched to Carfilzomib, Cytoxan and dexamethasone.   04/22/2014 Procedure   she has placement of port for chemotherapy.   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 at [facility]   10/20/2014 - 03/24/2015 Chemotherapy    she received chemotherapy with Kyprolis, Revlimid and dexamethasone   10/22/2014 Procedure   She has port placement   01/19/2015 Tumor Marker  IgA lambda M spike at 0.4 g    01/20/2015 Miscellaneous   IVIG monthly was added for recurrent infections   02/02/2015 Miscellaneous   She received GCSF for severe neutropenia   02/26/2015 Bone Marrow Biopsy    she had bone marrow biopsy done at [facility] which showed mild pancytopenia but not diagnostic for myelodysplastic syndrome or multiple myeloma   07/22/2015 - 09/21/2015 Chemotherapy   She is receiving Daratumumab at [place] due to relapsed myeloma   08/03/2015 - 08/06/2015 Hospital Admission   She was admitted to the hospital for neutropenic fever. No cource was found and fever resolved with IV vancomycin  and meropenem   09/13/2015 Bone Marrow Biopsy   Bone marrow biopsy showed no increased blasts, 3-4 % plasma cells   03/02/2016 Bone Marrow Biopsy   Bone marrow biopsy at [facility] showed normocellular (30%) bone marrow with trilineage hematopoiesis. No significant increase in blasts. No significant increase in plasma cells.   05/12/2016 Imaging   DEXA scan at [facility] showed osteopenia   10/24/2016 Imaging   Skeletal survey at [facility], no new lesions   12/07/2017 Imaging   No focal abnormality noted to suggest myeloma. Exam is stable from prior exam.   03/01/2018 Procedure   Successful 8 French right internal jugular vein power port placement with its tip at the SVC/RA junction.   03/06/2018 -  Chemotherapy   The patient had daratumumab    07/09/2018 Bone Marrow Biopsy   Bone marrow biopsy at [facility] showed residual disease at 0.004% plasma cells   07/03/2019 Bone Marrow Biopsy   A. Bone marrow, flow cytometric analysis for multiple myeloma minimal residual disease detection:   Negative. No phenotypically abnormal plasma cells at or above the limit of detection identified.     No monotypic B-cell population identified. Negative for increased blasts.   03/25/2021 Bone Marrow Biopsy   Repeat bone marrow biopsy at [place] showed female donor karyotype.  5 to 7% plasma cell is seen.  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   04/20/2021 Echocardiogram   1. Left ventricular ejection fraction, by estimation, is 60 to 65%. The left ventricle has normal function. The left ventricle has no regional wall motion abnormalities. Left ventricular diastolic parameters were normal.  2. Right ventricular systolic function is normal. The right ventricular size is normal.  3. The mitral valve is normal in structure. No evidence of mitral valve regurgitation. No  evidence of mitral stenosis.  4. The aortic valve is normal in structure. Aortic valve regurgitation is not visualized. No aortic stenosis is present.  5. The inferior vena cava is normal in size with greater than 50% respiratory variability, suggesting right atrial pressure of 3 mmHg.   04/29/2021 - 02/17/2022 Chemotherapy   Patient is on Treatment Plan : MYELOMA RELAPSED/ REFRACTORY Carfilzomib D1,8,15 (20/27) + Dexamethasone (KPd) q28d     04/30/2021 -  Chemotherapy   Patient is on Treatment Plan : MYELOMA RELAPSED/REFRACTORY Carfilzomib (20/70) D1,8,15 + Dexamethasone weekly (40) (Kd) q28d  x 9 cycles / Dexamethasone D1,8,15     05/04/2021 PET scan   1. No evidence active multiple myeloma within the skeleton on FDG PET scan. 2. No suspicious lytic lesion on CT portion exam. 3. No plasmacytoma.   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   10/31/2015 Miscellaneous   She has engrafted     PHYSICAL EXAMINATION: ECOG PERFORMANCE STATUS: 1 - Symptomatic but completely ambulatory GENERAL:alert, no distress and comfortable SKIN: skin color, texture, turgor are normal, no rashes or significant lesions EYES: normal, Conjunctiva are pink and non-injected, sclera clear OROPHARYNX:no exudate, no erythema and lips, buccal mucosa, and tongue normal  NECK: supple, thyroid normal size, non-tender, without  nodularity LYMPH:  no palpable lymphadenopathy in the cervical, axillary or inguinal LUNGS: Wheezes has improved.  She has bibasilar crackles HEART: regular rate & rhythm and no murmurs and no lower extremity edema ABDOMEN:abdomen soft, non-tender and normal bowel sounds Musculoskeletal:no cyanosis of digits and no clubbing  NEURO: alert & oriented x 3 with fluent speech, no focal motor/sensory deficits  LABORATORY DATA:  I have reviewed the data as listed    Component Value Date/Time   NA 141 04/14/2022 0729   NA 142 01/04/2017 0902   K 3.7 04/14/2022 0729   K 3.9 01/04/2017 0902   CL 108 04/14/2022 0729   CO2 28 04/14/2022 0729   CO2 28 01/04/2017 0902   GLUCOSE 94 04/14/2022 0729   GLUCOSE 92 01/04/2017 0902   BUN 21 04/14/2022 0729   BUN 9.5 01/04/2017 0902   CREATININE 0.74 04/14/2022 0729   CREATININE 0.7 01/04/2017 0902   CALCIUM 8.5 (L) 04/14/2022 0729   CALCIUM 9.0 01/04/2017 0902   PROT 6.4 (L) 04/14/2022 0729   PROT 6.0 (L) 01/04/2017 0902   PROT 5.8 (L) 01/04/2017 0902   ALBUMIN 3.6 04/14/2022 0729   ALBUMIN 3.4 (L) 01/04/2017 0902   AST 23 04/14/2022 0729   AST 21 01/04/2017 0902   ALT 14 04/14/2022 0729   ALT 16 01/04/2017 0902   ALKPHOS 69 04/14/2022 0729   ALKPHOS 101 01/04/2017 0902   BILITOT 0.2 (L) 04/14/2022 0729   BILITOT 0.56 01/04/2017 0902   GFRNONAA >60 04/14/2022 0729   GFRAA >60 03/25/2020 0917   GFRAA >60 12/18/2018 0901    No results found for: "SPEP", "UPEP"  Lab Results  Component Value Date   WBC 2.6 (L) 04/14/2022   NEUTROABS 1.3 (L) 04/14/2022   HGB 9.9 (L) 04/14/2022   HCT 31.2 (L) 04/14/2022   MCV 100.0 04/14/2022   PLT 153 04/14/2022      Chemistry      Component Value Date/Time   NA 141 04/14/2022 0729   NA 142 01/04/2017 0902   K 3.7 04/14/2022 0729   K 3.9 01/04/2017 0902   CL 108 04/14/2022 0729   CO2 28 04/14/2022 0729   CO2 28 01/04/2017 0902   BUN 21 04/14/2022 0729   BUN 9.5 01/04/2017 0902   CREATININE  0.74 04/14/2022 0729   CREATININE 0.7 01/04/2017 0902      Component Value Date/Time   CALCIUM 8.5 (L) 04/14/2022 0729   CALCIUM 9.0 01/04/2017 0902   ALKPHOS 69 04/14/2022 0729   ALKPHOS 101 01/04/2017 0902   AST 23 04/14/2022 0729   AST 21 01/04/2017 0902   ALT 14 04/14/2022 0729   ALT 16  01/04/2017 0902   BILITOT 0.2 (L) 04/14/2022 0729   BILITOT 0.56 01/04/2017 0902

## 2022-04-17 NOTE — Assessment & Plan Note (Signed)
Clinically, she is better She will complete IVIG today We will monitor her blood pressure throughout her treatment

## 2022-04-19 ENCOUNTER — Telehealth: Payer: Self-pay

## 2022-04-19 LAB — MULTIPLE MYELOMA PANEL, SERUM
Albumin SerPl Elph-Mcnc: 3 g/dL (ref 2.9–4.4)
Albumin/Glob SerPl: 1.1 (ref 0.7–1.7)
Alpha 1: 0.3 g/dL (ref 0.0–0.4)
Alpha2 Glob SerPl Elph-Mcnc: 1 g/dL (ref 0.4–1.0)
B-Globulin SerPl Elph-Mcnc: 0.7 g/dL (ref 0.7–1.3)
Gamma Glob SerPl Elph-Mcnc: 0.8 g/dL (ref 0.4–1.8)
Globulin, Total: 2.9 g/dL (ref 2.2–3.9)
IgA: 382 mg/dL (ref 64–422)
IgG (Immunoglobin G), Serum: 413 mg/dL — ABNORMAL LOW (ref 586–1602)
IgM (Immunoglobulin M), Srm: 37 mg/dL (ref 26–217)
M Protein SerPl Elph-Mcnc: 0.4 g/dL — ABNORMAL HIGH
Total Protein ELP: 5.9 g/dL — ABNORMAL LOW (ref 6.0–8.5)

## 2022-04-19 NOTE — Telephone Encounter (Signed)
Jean Davidson, the CAR-T Coordinator at [facility] called to inform us that patient is scheduled for apheresis on 11/14 at [facility]. Patient will need to be off of carfilzomib for 2 weeks prior to that date. Schedule will be adjusted as needed. Dr. Alvy Bimler made aware of treatment plan.

## 2022-04-20 ENCOUNTER — Other Ambulatory Visit: Payer: Self-pay | Admitting: Hematology and Oncology

## 2022-04-20 ENCOUNTER — Telehealth: Payer: Self-pay

## 2022-04-20 NOTE — Telephone Encounter (Signed)
Called her to see how is doing today. She is better. BP is down and she has lost 5 lbs. Told her we got call and fax from [facility] regarding upcoming appts. Appts canceled at [facility] and Dr. Alvy Bimler will be on stand by if needed. She verbalized understanding and will call the office if something is needed.

## 2022-04-21 ENCOUNTER — Telehealth: Payer: Self-pay

## 2022-04-21 NOTE — Telephone Encounter (Signed)
Returned her call and left a message. Printed off media per her request and will mail out Health care POA on file to her address.

## 2022-04-24 ENCOUNTER — Encounter (INDEPENDENT_AMBULATORY_CARE_PROVIDER_SITE_OTHER): Payer: Self-pay

## 2022-04-28 ENCOUNTER — Ambulatory Visit: Payer: Medicare HMO

## 2022-04-28 ENCOUNTER — Ambulatory Visit: Payer: Medicare HMO | Admitting: Hematology and Oncology

## 2022-04-28 ENCOUNTER — Other Ambulatory Visit: Payer: Medicare HMO

## 2022-05-05 ENCOUNTER — Ambulatory Visit: Payer: Medicare HMO

## 2022-05-05 ENCOUNTER — Other Ambulatory Visit: Payer: Medicare HMO

## 2022-05-12 ENCOUNTER — Ambulatory Visit: Payer: Medicare HMO

## 2022-05-12 ENCOUNTER — Other Ambulatory Visit: Payer: Medicare HMO

## 2022-05-14 ENCOUNTER — Other Ambulatory Visit: Payer: Self-pay | Admitting: Cardiovascular Disease

## 2022-05-22 ENCOUNTER — Telehealth: Payer: Self-pay

## 2022-05-22 ENCOUNTER — Telehealth: Payer: Self-pay | Admitting: Cardiovascular Disease

## 2022-05-22 ENCOUNTER — Other Ambulatory Visit: Payer: Self-pay

## 2022-05-22 DIAGNOSIS — C9002 Multiple myeloma in relapse: Secondary | ICD-10-CM

## 2022-05-22 MED ORDER — METOPROLOL TARTRATE 25 MG PO TABS: ORAL_TABLET | ORAL | 0 refills | Status: DC

## 2022-05-22 MED ORDER — ACYCLOVIR 400 MG PO TABS: 400.0000 mg | ORAL_TABLET | Freq: Two times a day (BID) | ORAL | 0 refills | Status: DC

## 2022-05-22 MED ORDER — MIRTAZAPINE 7.5 MG PO TABS: 7.5000 mg | ORAL_TABLET | Freq: Every day | ORAL | 0 refills | Status: DC
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
## 2022-05-22 NOTE — Telephone Encounter (Signed)
yes
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
## 2022-05-22 NOTE — Telephone Encounter (Signed)
Sent Rx's to her pharmacy as requested in [place].

## 2022-05-22 NOTE — Telephone Encounter (Signed)
Called the patient to inform her that the prescription was sent to the requested pharmacy.

## 2022-05-23 ENCOUNTER — Telehealth: Payer: Self-pay

## 2022-05-23 NOTE — Telephone Encounter (Signed)
Received a voice mail from Irving Copas coordinator at [employer]. Margo has appt at [facility] with ortho on 12/6. Just FYI

## 2022-06-05 ENCOUNTER — Telehealth: Payer: Self-pay
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
## 2022-06-07 ENCOUNTER — Telehealth: Payer: Self-pay

## 2022-06-07 NOTE — Telephone Encounter (Signed)
Port only needs to be flushed every 8 weeks She needs to bring her medical needs to her local physician; I do not have medical license to practice in [place]

## 2022-06-07 NOTE — Telephone Encounter (Signed)
Called and left message with Pt stating that port only needs to be accessed every 8 weeks and Pt is well within that time frame. Also relayed that Dr. Alvy Bimler is aware of recent labs but is unable to treat at this time do to Pt's location. Instructed Pt to seek medical care/questions from local MD. Relayed our number for further questions regarding this message.

## 2022-06-07 NOTE — Telephone Encounter (Signed)
Returned Pts call regarding CBC results and infusaport. Port last accessed on 11/13 with no upcoming appts. Pt is concerned about the downward trend of Hgb and reports some fatigue. Pt is still in [place] and traveling back to [place] is complicated by not being able to find an inpt rehab.

## 2022-06-09 ENCOUNTER — Other Ambulatory Visit: Payer: Self-pay | Admitting: Hematology and Oncology

## 2022-06-09 ENCOUNTER — Telehealth: Payer: Self-pay

## 2022-06-09 DIAGNOSIS — D6481 Anemia due to antineoplastic chemotherapy: Secondary | ICD-10-CM

## 2022-06-09 DIAGNOSIS — C9002 Multiple myeloma in relapse: Secondary | ICD-10-CM

## 2022-06-09 NOTE — Telephone Encounter (Signed)
I sent LOS

## 2022-06-09 NOTE — Telephone Encounter (Signed)
Returned her call. She is coming home on 12/20 to [place] and will have 24 hour care person. She had recent labs in [place]. She is asking for appt for port/lab flush, MD and possible blood transfusion. She will have transportation and be available for appt on 12/21 and 12/22. She is concerned about her labs.

## 2022-06-16 ENCOUNTER — Encounter: Payer: Self-pay | Admitting: Hematology and Oncology

## 2022-06-16 ENCOUNTER — Inpatient Hospital Stay (HOSPITAL_BASED_OUTPATIENT_CLINIC_OR_DEPARTMENT_OTHER): Payer: Medicare HMO | Admitting: Hematology and Oncology

## 2022-06-16 ENCOUNTER — Other Ambulatory Visit (HOSPITAL_COMMUNITY): Payer: Self-pay

## 2022-06-16 ENCOUNTER — Inpatient Hospital Stay: Payer: Medicare HMO | Attending: Hematology and Oncology

## 2022-06-16 ENCOUNTER — Inpatient Hospital Stay: Payer: Medicare HMO

## 2022-06-16 VITALS — BP 149/74 | HR 65 | Temp 97.9°F | Resp 18

## 2022-06-16 DIAGNOSIS — D469 Myelodysplastic syndrome, unspecified: Secondary | ICD-10-CM

## 2022-06-16 DIAGNOSIS — D6481 Anemia due to antineoplastic chemotherapy: Secondary | ICD-10-CM

## 2022-06-16 DIAGNOSIS — C9002 Multiple myeloma in relapse: Secondary | ICD-10-CM | POA: Insufficient documentation

## 2022-06-16 DIAGNOSIS — S82892A Other fracture of left lower leg, initial encounter for closed fracture: Secondary | ICD-10-CM | POA: Diagnosis not present

## 2022-06-16 DIAGNOSIS — T451X5A Adverse effect of antineoplastic and immunosuppressive drugs, initial encounter: Secondary | ICD-10-CM | POA: Diagnosis not present

## 2022-06-16 LAB — CBC WITH DIFFERENTIAL/PLATELET
Abs Immature Granulocytes: 0.02 10*3/uL (ref 0.00–0.07)
Basophils Absolute: 0 10*3/uL (ref 0.0–0.1)
Basophils Relative: 0 %
Eosinophils Absolute: 0 10*3/uL (ref 0.0–0.5)
Eosinophils Relative: 0 %
HCT: 29.4 % — ABNORMAL LOW (ref 36.0–46.0)
Hemoglobin: 9.1 g/dL — ABNORMAL LOW (ref 12.0–15.0)
Immature Granulocytes: 0 %
Lymphocytes Relative: 21 %
Lymphs Abs: 1.4 10*3/uL (ref 0.7–4.0)
MCH: 31.6 pg (ref 26.0–34.0)
MCHC: 31 g/dL (ref 30.0–36.0)
MCV: 102.1 fL — ABNORMAL HIGH (ref 80.0–100.0)
Monocytes Absolute: 0.9 10*3/uL (ref 0.1–1.0)
Monocytes Relative: 13 %
Neutro Abs: 4.2 10*3/uL (ref 1.7–7.7)
Neutrophils Relative %: 66 %
Platelets: 366 10*3/uL (ref 150–400)
RBC: 2.88 MIL/uL — ABNORMAL LOW (ref 3.87–5.11)
RDW: 14.1 % (ref 11.5–15.5)
WBC: 6.5 10*3/uL (ref 4.0–10.5)
nRBC: 0 % (ref 0.0–0.2)

## 2022-06-16 LAB — COMPREHENSIVE METABOLIC PANEL
ALT: 6 U/L (ref 0–44)
AST: 11 U/L — ABNORMAL LOW (ref 15–41)
Albumin: 3.7 g/dL (ref 3.5–5.0)
Alkaline Phosphatase: 86 U/L (ref 38–126)
Anion gap: 7 (ref 5–15)
BUN: 16 mg/dL (ref 8–23)
CO2: 29 mmol/L (ref 22–32)
Calcium: 9.3 mg/dL (ref 8.9–10.3)
Chloride: 106 mmol/L (ref 98–111)
Creatinine, Ser: 0.7 mg/dL (ref 0.44–1.00)
GFR, Estimated: >60 mL/min (ref >60)
Glucose, Bld: 101 mg/dL — ABNORMAL HIGH (ref 70–99)
Potassium: 3.6 mmol/L (ref 3.5–5.1)
Sodium: 142 mmol/L (ref 135–145)
Total Bilirubin: 0.3 mg/dL (ref 0.3–1.2)
Total Protein: 7.5 g/dL (ref 6.5–8.1)

## 2022-06-16 LAB — SAMPLE TO BLOOD BANK

## 2022-06-16 MED ORDER — SODIUM CHLORIDE 0.9% FLUSH
10.0000 mL | Freq: Once | INTRAVENOUS | Status: AC
  Administered 2022-06-16: 10 mL

## 2022-06-16 MED ORDER — MIRTAZAPINE 7.5 MG PO TABS
7.5000 mg | ORAL_TABLET | Freq: Every day | ORAL | 0 refills | Status: DC
  Filled 2022-06-16: qty 43, 43d supply, fill #0
  Filled 2022-06-16: qty 47, 47d supply, fill #0
  Filled 2022-06-16: qty 30, 30d supply, fill #0

## 2022-06-16 MED ORDER — RIVAROXABAN 10 MG PO TABS
10.0000 mg | ORAL_TABLET | Freq: Every day | ORAL | 2 refills | Status: DC
  Filled 2022-06-16: qty 30, 30d supply, fill #0

## 2022-06-16 MED ORDER — RIVAROXABAN 20 MG PO TABS
20.0000 mg | ORAL_TABLET | Freq: Every day | ORAL | 0 refills | Status: DC
  Filled 2022-06-16: qty 30, 30d supply, fill #0

## 2022-06-16 NOTE — Assessment & Plan Note (Signed)
She has completed surgery We discussed importance of recovery She is prescribed home-based physical therapy Due to her significant immobility and active cancer, she could be at risk of DVT I recommend prophylactic dose of Xarelto for the next 4 to 6 weeks The risk, benefits, side effects were discussed and she is in agreement to proceed

## 2022-06-16 NOTE — Progress Notes (Signed)
[place] OFFICE PROGRESS NOTE  Patient Care Team: Default, Provider, MD as PCP - General [name], MD as PCP - Cardiology (Cardiology) [name], MD as Referring Physician (Hematology and Oncology) [name], MD as Consulting Physician (Internal Medicine) [name], [name], MD as Consulting Physician (Infectious Diseases) [name], MD as Consulting Physician (Hematology and Oncology) [name], NP as Nurse Practitioner (Hematology and Oncology) [name], MD as Consulting Physician (Hematology and Oncology)  ASSESSMENT & PLAN:  Multiple myeloma in relapse [facility]) Due to her recent fall and surgery, she is not ready to resume chemotherapy I plan to see her again in a month for further follow-up Continue supportive care She will continue calcium and vitamin D supplement and acyclovir for antimicrobial prophylaxis  Closed left ankle fracture She has completed surgery We discussed importance of recovery She is prescribed home-based physical therapy Due to her significant immobility and active cancer, she could be at risk of DVT I recommend prophylactic dose of Xarelto for the next 4 to 6 weeks The risk, benefits, side effects were discussed and she is in agreement to proceed  Anemia due to antineoplastic chemotherapy She has history of anemia and now has anemia secondary to recent surgery She is not symptomatic She does not need transfusion support Monitor closely  No orders of the defined types were placed in this encounter.   All questions were answered. The patient knows to call the clinic with any problems, questions or concerns. The total time spent in the appointment was 30 minutes encounter with patients including review of chart and various tests results, discussions about plan of care and coordination of care plan   [name], MD 06/16/2022 11:39 AM  INTERVAL HISTORY: Please see below for  problem oriented charting. she returns for treatment follow-up The patient fell several weeks ago and had surgery in [place] She just returned home She has home helper, [place] present throughout the visit She denies pain She has concern regarding her anemia as well as plan for chemotherapy Denies recent fever or chills She was told her current status is nonweightbearing for the next 6 weeks  REVIEW OF SYSTEMS:   Constitutional: Denies fevers, chills or abnormal weight loss Eyes: Denies blurriness of vision Ears, nose, mouth, throat, and face: Denies mucositis or sore throat Respiratory: Denies cough, dyspnea or wheezes Cardiovascular: Denies palpitation, chest discomfort or lower extremity swelling Gastrointestinal:  Denies nausea, heartburn or change in bowel habits Skin: Denies abnormal skin rashes Lymphatics: Denies new lymphadenopathy or easy bruising Neurological:Denies numbness, tingling or new weaknesses Behavioral/Psych: Mood is stable, no new changes  All other systems were reviewed with the patient and are negative.  I have reviewed the past medical history, past surgical history, social history and family history with the patient and they are unchanged from previous note.  ALLERGIES:  has No Known Allergies.  MEDICATIONS:  Current Outpatient Medications  Medication Sig Dispense Refill   rivaroxaban (XARELTO) 10 MG TABS tablet Take 1 tablet (10 mg total) by mouth daily. 30 tablet 2   acyclovir (ZOVIRAX) 400 MG tablet Take 1 tablet (400 mg total) by mouth 2 (two) times daily. 30 tablet 0   aspirin 325 MG tablet Take 325 mg by mouth daily.     calcium carbonate (TUMS - DOSED IN MG ELEMENTAL CALCIUM) 500 MG chewable tablet Chew 1 tablet by mouth daily.     Carfilzomib (KYPROLIS IV) Inject into the vein once a  week.     Cholecalciferol 25 MCG (1000 UT) tablet Take 1,000 Units by mouth daily.     Cyanocobalamin (VITAMIN B12 PO) Take 1 tablet by mouth daily.      dexamethasone (DECADRON) 4 MG tablet Take 1 tablet (4 mg total) by mouth once a week. Take 1 tablet by mouth 1 hour before chemotherapy 5 tablet 5   furosemide (LASIX) 20 MG tablet Take 1 tablet (20 mg total) by mouth daily. 10 tablet 0   loperamide (IMODIUM) 2 MG capsule Take by mouth as needed for diarrhea or loose stools. As needed only     metoprolol tartrate (LOPRESSOR) 25 MG tablet TAKE 1 & 1/2 TABLET BY MOUTH TWICE A DAY 90 tablet 0   mirtazapine (REMERON) 7.5 MG tablet Take 1 tablet (7.5 mg total) by mouth at bedtime. 90 tablet 0   Multiple Vitamin (MULTIVITAMIN WITH MINERALS) TABS tablet Take 1 tablet by mouth daily.     Multiple Vitamins-Minerals (PRESERVISION AREDS 2 PO) Take 1 tablet by mouth 2 (two) times daily.     ondansetron (ZOFRAN) 8 MG tablet Take 1 tablet (8 mg total) by mouth every 8 (eight) hours as needed (Nausea or vomiting). 30 tablet 1   pantoprazole (PROTONIX) 40 MG tablet TAKE 1 TABLET BY MOUTH 2 TIMES DAILY. TAKE 30-60 MINUTES BEFORE BREAKFAST AND DINNER. 180 tablet 2   No current facility-administered medications for this visit.    SUMMARY OF ONCOLOGIC HISTORY: Oncology History Overview Note  Multiple myeloma, Ig A Lambda, M spike 3.54 grams, Calcium 9.2, Creatinine 0.8, Beta 2 microglobulin 4.52, IgA 4840 mg/dL, lambda light chain 75.4, albumin 3.6, hemoglobin 9.7, platelet 115    Primary site: Multiple Myeloma   Staging method: AJCC 6th Edition   Clinical: Stage IIA signed by [name], MD on 11/07/2013  2:46 PM   Summary: Stage IIA S/p Allo transplant at [facility] on revlimid, pomalyst, daratumumab and velcade     Multiple myeloma in relapse ([place])  10/31/2013 Bone Marrow Biopsy   Bone marrow biopsy confirmed multiple myeloma with 40% bone marrow involvement. Skeletal survey showed minimal lesions in her score with generalized demineralization   11/10/2013 - 02/13/2014 Chemotherapy   The patient is started on induction chemotherapy with weekly  dexamethasone 40 mg by mouth as well as Velcade subcutaneous injection on days 1, 4, 8 and 11. On 11/21/2013, she was started on monthly Zometa.   12/23/2013 Adverse Reaction   The dose of Velcade was reduced due to thrombocytopenia.   01/28/2014 - 04/07/2014 Chemotherapy   Revlimid is added. Treatment was discontinued due to lack of response.   02/24/2014 - 04/07/2014 Chemotherapy   Due to worsening peripheral neuropathy, Velcade injection is changed to once a week. Revlimid was given 21 days on, 7 days off.   04/07/2014 - 04/10/2014 Chemotherapy   Revlimid was discontinued due to lack of response. Chemotherapy was changed back to Velcade injection twice a week, 2 weeks on 1 week off. Her treatment was switched to to minimum response   04/20/2014 - 06/02/2014 Chemotherapy   chemotherapy is switched to Carfilzomib, Cytoxan and dexamethasone.   04/22/2014 Procedure   she has placement of port for chemotherapy.   06/01/2014 Tumor Marker   Bloodwork show that she has greater than partial response   06/23/2014 Bone Marrow Biopsy   Bone marrow biopsy show 5-10% residual plasma cells, normal cytogenetics and FISH   07/07/2014 Procedure   She had stem cell collection   07/22/2014 - 07/22/2014 Chemotherapy  She had high-dose chemotherapy with melphalan   07/23/2014 Bone Marrow Transplant   She had bone marrow transplant in autologous fashion at [facility]   10/20/2014 - 03/24/2015 Chemotherapy    she received chemotherapy with Kyprolis, Revlimid and dexamethasone   10/22/2014 Procedure   She has port placement   01/19/2015 Tumor Marker   IgA lambda M spike at 0.4 g    01/20/2015 Miscellaneous   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 No cource was found and fever resolved with IV vancomycin and meropenem   09/13/2015 Bone Marrow Biopsy   Bone marrow biopsy showed no increased blasts, 3-4 % plasma cells   03/02/2016 Bone Marrow Biopsy   Bone marrow biopsy at [facility] showed normocellular (30%) bone marrow with trilineage hematopoiesis. No significant increase in blasts. No significant increase in plasma cells.   05/12/2016 Imaging   DEXA scan at [place] showed osteopenia   10/24/2016 Imaging   Skeletal survey at [facility], no new lesions   12/07/2017 Imaging   No focal abnormality noted to suggest myeloma. Exam is stable from prior exam.   03/01/2018 Procedure   Successful 8 French right internal jugular vein power port placement with its tip at the SVC/RA junction.   03/06/2018 -  Chemotherapy   The patient had daratumumab    07/09/2018 Bone Marrow Biopsy   Bone marrow biopsy at [facility] showed residual disease at 0.004% plasma cells   07/03/2019 Bone Marrow Biopsy   A. Bone marrow, flow cytometric analysis for multiple myeloma minimal residual disease detection:   Negative. No phenotypically abnormal plasma cells at or above the limit of detection identified.     No monotypic B-cell population identified. Negative for increased blasts.   03/25/2021 Bone Marrow Biopsy   Repeat bone marrow biopsy at [facility] showed female donor karyotype.  5 to 7% plasma cell is seen.  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   04/20/2021 Echocardiogram   1. Left ventricular ejection fraction, by estimation, is 60 to 65%. The left ventricle has normal function. The left ventricle has no regional wall motion  abnormalities. Left ventricular diastolic parameters were normal.  2. Right ventricular systolic function is normal. The right ventricular size is normal.  3. The mitral valve is normal in structure. No evidence of mitral valve regurgitation. No evidence of mitral stenosis.  4. The aortic valve is normal in structure. Aortic valve regurgitation is not visualized. No aortic stenosis is present.  5. The inferior vena cava is normal in size with greater than 50% respiratory variability, suggesting right atrial pressure of 3 mmHg.   04/29/2021 - 02/17/2022 Chemotherapy   Patient is on Treatment Plan : MYELOMA RELAPSED/ REFRACTORY Carfilzomib D1,8,15 (20/27) + Dexamethasone (KPd) q28d     04/30/2021 -  Chemotherapy   Patient is on Treatment Plan : MYELOMA RELAPSED/REFRACTORY Carfilzomib (20/70) D1,8,15 + Dexamethasone weekly (40) (Kd) q28d  x  9 cycles / Dexamethasone D1,8,15     05/04/2021 PET scan   1. No evidence active multiple myeloma within the skeleton on FDG PET scan. 2. No suspicious lytic lesion on CT portion exam. 3. No plasmacytoma.   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   10/31/2015 Miscellaneous   She has engrafted     PHYSICAL EXAMINATION: ECOG PERFORMANCE STATUS: 2 - Symptomatic, <50% confined to bed  Vitals:   06/16/22 1118  BP: (!) 149/74  Pulse: 65  Resp: 18  Temp: 97.9 F (36.6  C)  SpO2: 100%   There were no vitals filed for this visit.  GENERAL:alert, no distress and comfortable Musculoskeletal: She has a cast on the left ankle NEURO: alert & oriented x 3 with fluent speech, no focal motor/sensory deficits  LABORATORY DATA:  I have reviewed the data as listed    Component Value Date/Time   NA 142 06/16/2022 1054   NA 142 01/04/2017 0902   K 3.6 06/16/2022 1054   K 3.9 01/04/2017 0902   CL 106 06/16/2022 1054   CO2 29 06/16/2022 1054   CO2 28 01/04/2017 0902   GLUCOSE 101 (H) 06/16/2022 1054   GLUCOSE 92 01/04/2017 0902   BUN 16 06/16/2022 1054   BUN 9.5 01/04/2017 0902   CREATININE 0.70 06/16/2022 1054   CREATININE 0.74 04/14/2022 0729   CREATININE 0.7 01/04/2017 0902   CALCIUM 9.3 06/16/2022 1054   CALCIUM 9.0 01/04/2017 0902   PROT 7.5 06/16/2022 1054   PROT 6.0 (L) 01/04/2017 0902   PROT 5.8 (L) 01/04/2017 0902   ALBUMIN 3.7 06/16/2022 1054   ALBUMIN 3.4 (L) 01/04/2017 0902   AST 11 (L) 06/16/2022 1054   AST 23 04/14/2022 0729   AST 21 01/04/2017 0902   ALT 6 06/16/2022 1054   ALT 14 04/14/2022 0729   ALT 16 01/04/2017 0902   ALKPHOS 86 06/16/2022 1054   ALKPHOS 101 01/04/2017 0902   BILITOT 0.3 06/16/2022 1054   BILITOT 0.2 (L) 04/14/2022 0729   BILITOT 0.56 01/04/2017 0902   GFRNONAA >60 06/16/2022 1054   GFRNONAA >60 04/14/2022 0729   GFRAA >60 03/25/2020 0917   GFRAA >60 12/18/2018 0901    No results found for: "SPEP", "UPEP"  Lab Results  Component Value Date   WBC 6.5 06/16/2022   NEUTROABS 4.2 06/16/2022   HGB 9.1 (L) 06/16/2022   HCT 29.4 (L) 06/16/2022   MCV 102.1 (H) 06/16/2022   PLT 366 06/16/2022      Chemistry      Component Value Date/Time   NA 142 06/16/2022 1054   NA 142 01/04/2017 0902   K 3.6 06/16/2022 1054   K 3.9 01/04/2017 0902   CL 106 06/16/2022 1054   CO2 29 06/16/2022 1054   CO2 28 01/04/2017 0902   BUN 16 06/16/2022 1054   BUN 9.5 01/04/2017 0902   CREATININE 0.70 06/16/2022 1054    CREATININE 0.74 04/14/2022 0729   CREATININE 0.7 01/04/2017 0902  Component Value Date/Time   CALCIUM 9.3 06/16/2022 1054   CALCIUM 9.0 01/04/2017 0902   ALKPHOS 86 06/16/2022 1054   ALKPHOS 101 01/04/2017 0902   AST 11 (L) 06/16/2022 1054   AST 23 04/14/2022 0729   AST 21 01/04/2017 0902   ALT 6 06/16/2022 1054   ALT 14 04/14/2022 0729   ALT 16 01/04/2017 0902   BILITOT 0.3 06/16/2022 1054   BILITOT 0.2 (L) 04/14/2022 0729   BILITOT 0.56 01/04/2017 0902

## 2022-06-16 NOTE — Assessment & Plan Note (Signed)
She has history of anemia and now has anemia secondary to recent surgery She is not symptomatic She does not need transfusion support Monitor closely

## 2022-06-16 NOTE — Assessment & Plan Note (Signed)
Due to her recent fall and surgery, she is not ready to resume chemotherapy I plan to see her again in a month for further follow-up Continue supportive care She will continue calcium and vitamin D supplement and acyclovir for antimicrobial prophylaxis

## 2022-06-20 ENCOUNTER — Other Ambulatory Visit: Payer: Self-pay | Admitting: Cardiovascular Disease

## 2022-06-20 ENCOUNTER — Other Ambulatory Visit: Payer: Self-pay | Admitting: Hematology and Oncology

## 2022-06-20 LAB — KAPPA/LAMBDA LIGHT CHAINS
Kappa free light chain: 9.5 mg/L (ref 3.3–19.4)
Kappa, lambda light chain ratio: 0.02 — ABNORMAL LOW (ref 0.26–1.65)
Lambda free light chains: 425.3 mg/L — ABNORMAL HIGH (ref 5.7–26.3)

## 2022-06-22 LAB — MULTIPLE MYELOMA PANEL, SERUM
Albumin SerPl Elph-Mcnc: 3.1 g/dL (ref 2.9–4.4)
Albumin/Glob SerPl: 0.9 (ref 0.7–1.7)
Alpha 1: 0.3 g/dL (ref 0.0–0.4)
Alpha2 Glob SerPl Elph-Mcnc: 1 g/dL (ref 0.4–1.0)
B-Globulin SerPl Elph-Mcnc: 0.8 g/dL (ref 0.7–1.3)
Gamma Glob SerPl Elph-Mcnc: 1.5 g/dL (ref 0.4–1.8)
Globulin, Total: 3.6 g/dL (ref 2.2–3.9)
IgA: 945 mg/dL — ABNORMAL HIGH (ref 64–422)
IgG (Immunoglobin G), Serum: 713 mg/dL (ref 586–1602)
IgM (Immunoglobulin M), Srm: 36 mg/dL (ref 26–217)
M Protein SerPl Elph-Mcnc: 0.8 g/dL — ABNORMAL HIGH
Total Protein ELP: 6.7 g/dL (ref 6.0–8.5)

## 2022-07-03 ENCOUNTER — Telehealth: Payer: Self-pay

## 2022-07-03 ENCOUNTER — Other Ambulatory Visit: Payer: Self-pay | Admitting: Hematology and Oncology

## 2022-07-03 ENCOUNTER — Encounter: Payer: Self-pay | Admitting: Hematology and Oncology

## 2022-07-03 DIAGNOSIS — C9002 Multiple myeloma in relapse: Secondary | ICD-10-CM

## 2022-07-03 NOTE — Telephone Encounter (Signed)
Called and scheduled port lab flush tomorrow at 9 am and see Dr. Alvy Bimler at [phone]. Treatment will be added after MD appt. She is aware of appts.

## 2022-07-04 ENCOUNTER — Inpatient Hospital Stay: Payer: Medicare HMO

## 2022-07-04 ENCOUNTER — Inpatient Hospital Stay: Payer: Medicare HMO | Admitting: Hematology and Oncology

## 2022-07-04 ENCOUNTER — Telehealth: Payer: Self-pay

## 2022-07-04 ENCOUNTER — Telehealth: Payer: Self-pay | Admitting: Pharmacy Technician

## 2022-07-04 ENCOUNTER — Encounter: Payer: Self-pay | Admitting: Hematology and Oncology

## 2022-07-04 ENCOUNTER — Inpatient Hospital Stay: Payer: Medicare HMO | Attending: Hematology and Oncology

## 2022-07-04 ENCOUNTER — Other Ambulatory Visit (HOSPITAL_COMMUNITY): Payer: Self-pay

## 2022-07-04 VITALS — BP 150/48 | HR 62 | Temp 97.4°F | Resp 18

## 2022-07-04 DIAGNOSIS — S82892A Other fracture of left lower leg, initial encounter for closed fracture: Secondary | ICD-10-CM

## 2022-07-04 DIAGNOSIS — D6481 Anemia due to antineoplastic chemotherapy: Secondary | ICD-10-CM | POA: Insufficient documentation

## 2022-07-04 DIAGNOSIS — Z5111 Encounter for antineoplastic chemotherapy: Secondary | ICD-10-CM | POA: Insufficient documentation

## 2022-07-04 DIAGNOSIS — C9002 Multiple myeloma in relapse: Secondary | ICD-10-CM | POA: Diagnosis not present

## 2022-07-04 DIAGNOSIS — Z7901 Long term (current) use of anticoagulants: Secondary | ICD-10-CM

## 2022-07-04 DIAGNOSIS — T451X5A Adverse effect of antineoplastic and immunosuppressive drugs, initial encounter: Secondary | ICD-10-CM | POA: Diagnosis not present

## 2022-07-04 LAB — CBC WITH DIFFERENTIAL (CANCER CENTER ONLY)
Abs Immature Granulocytes: 0.02 10*3/uL (ref 0.00–0.07)
Basophils Absolute: 0 10*3/uL (ref 0.0–0.1)
Basophils Relative: 0 %
Eosinophils Absolute: 0.2 10*3/uL (ref 0.0–0.5)
Eosinophils Relative: 3 %
HCT: 30.3 % — ABNORMAL LOW (ref 36.0–46.0)
Hemoglobin: 9.5 g/dL — ABNORMAL LOW (ref 12.0–15.0)
Immature Granulocytes: 0 %
Lymphocytes Relative: 16 %
Lymphs Abs: 1.1 10*3/uL (ref 0.7–4.0)
MCH: 31.3 pg (ref 26.0–34.0)
MCHC: 31.4 g/dL (ref 30.0–36.0)
MCV: 99.7 fL (ref 80.0–100.0)
Monocytes Absolute: 0.8 10*3/uL (ref 0.1–1.0)
Monocytes Relative: 13 %
Neutro Abs: 4.4 10*3/uL (ref 1.7–7.7)
Neutrophils Relative %: 68 %
Platelet Count: 389 10*3/uL (ref 150–400)
RBC: 3.04 MIL/uL — ABNORMAL LOW (ref 3.87–5.11)
RDW: 14 % (ref 11.5–15.5)
WBC Count: 6.5 10*3/uL (ref 4.0–10.5)
nRBC: 0 % (ref 0.0–0.2)

## 2022-07-04 LAB — CMP (CANCER CENTER ONLY)
ALT: 6 U/L (ref 0–44)
AST: 13 U/L — ABNORMAL LOW (ref 15–41)
Albumin: 3.7 g/dL (ref 3.5–5.0)
Alkaline Phosphatase: 71 U/L (ref 38–126)
Anion gap: 9 (ref 5–15)
BUN: 16 mg/dL (ref 8–23)
CO2: 27 mmol/L (ref 22–32)
Calcium: 9.4 mg/dL (ref 8.9–10.3)
Chloride: 103 mmol/L (ref 98–111)
Creatinine: 0.71 mg/dL (ref 0.44–1.00)
GFR, Estimated: >60 mL/min (ref >60)
Glucose, Bld: 96 mg/dL (ref 70–99)
Potassium: 4 mmol/L (ref 3.5–5.1)
Sodium: 139 mmol/L (ref 135–145)
Total Bilirubin: 0.2 mg/dL — ABNORMAL LOW (ref 0.3–1.2)
Total Protein: 8 g/dL (ref 6.5–8.1)

## 2022-07-04 LAB — SAMPLE TO BLOOD BANK

## 2022-07-04 MED ORDER — SODIUM CHLORIDE 0.9 % IV SOLN: Freq: Once | INTRAVENOUS | Status: AC

## 2022-07-04 MED ORDER — HEPARIN SOD (PORK) LOCK FLUSH 100 UNIT/ML IV SOLN
500.0000 [IU] | Freq: Once | INTRAVENOUS | Status: AC | PRN
  Administered 2022-07-04: 500 [IU]

## 2022-07-04 MED ORDER — DEXTROSE 5 % IV SOLN
34.0000 mg/m2 | Freq: Once | INTRAVENOUS | Status: AC
  Administered 2022-07-04: 60 mg via INTRAVENOUS
  Filled 2022-07-04: qty 30

## 2022-07-04 MED ORDER — CYCLOPHOSPHAMIDE 50 MG PO CAPS
250.0000 mg | ORAL_CAPSULE | ORAL | 11 refills | Status: DC
  Filled 2022-07-04 – 2022-07-05 (×2): qty 20, 28d supply, fill #0

## 2022-07-04 MED ORDER — MIRTAZAPINE 7.5 MG PO TABS: 7.5000 mg | ORAL_TABLET | Freq: Every day | ORAL | 0 refills | Status: DC

## 2022-07-04 MED ORDER — SODIUM CHLORIDE 0.9% FLUSH
10.0000 mL | INTRAVENOUS | Status: DC | PRN
  Administered 2022-07-04: 10 mL

## 2022-07-04 NOTE — Assessment & Plan Note (Signed)
I have reviewed recommendation from [place] from November She is ready to resume chemotherapy Previously, she tolerated carfilzomib at 34 mg/m well We will continue this dose for now with plan to escalate in the future if she tolerates well In addition, we discussed the risk and benefits of addition of Cytoxan The dose that was recommended by her physician at [facility] was very high I would like to start her at 250 mg once a week on the same day that she takes her chemotherapy It will take me approximately a week to get insurance authorization and hopefully, we can get her Cytoxan available and ready to start next week The risk, benefits, side effects of chemotherapy were discussed and she is ready to proceed She will continue once a week dexamethasone at home Due to recent bone injury, I will hold off Zometa for now She will continue calcium and vitamin D supplement

## 2022-07-04 NOTE — Progress Notes (Signed)
Pt. states she took her oral Decadron at home this am. Per Dr. Alvy Bimler, Zometa held today.

## 2022-07-04 NOTE — Patient Instructions (Signed)
[facility]  Discharge Instructions: Thank you for choosing [facility] to provide your oncology and hematology care.   If you have a lab appointment with the [facility], please go directly to the [facility] and check in at the registration area.   Wear comfortable clothing and clothing appropriate for easy access to any Portacath or PICC line.   We strive to give you quality time with your provider. You may need to reschedule your appointment if you arrive late (15 or more minutes).  Arriving late affects you and other patients whose appointments are after yours.  Also, if you miss three or more appointments without notifying the office, you may be dismissed from the clinic at the provider's discretion.      For prescription refill requests, have your pharmacy contact our office and allow 72 hours for refills to be completed.    Today you received the following chemotherapy and/or immunotherapy agent: Carfilzomib (Kyprolis)   To help prevent nausea and vomiting after your treatment, we encourage you to take your nausea medication as directed.  BELOW ARE SYMPTOMS THAT SHOULD BE REPORTED IMMEDIATELY: *FEVER GREATER THAN 100.4 F (38 C) OR HIGHER *CHILLS OR SWEATING *NAUSEA AND VOMITING THAT IS NOT CONTROLLED WITH YOUR NAUSEA MEDICATION *UNUSUAL SHORTNESS OF BREATH *UNUSUAL BRUISING OR BLEEDING *URINARY PROBLEMS (pain or burning when urinating, or frequent urination) *BOWEL PROBLEMS (unusual diarrhea, constipation, pain near the anus) TENDERNESS IN MOUTH AND THROAT WITH OR WITHOUT PRESENCE OF ULCERS (sore throat, sores in mouth, or a toothache) UNUSUAL RASH, SWELLING OR PAIN  UNUSUAL VAGINAL DISCHARGE OR ITCHING   Items with * indicate a potential emergency and should be followed up as soon as possible or go to the Emergency Department if any problems should occur.  Please show the CHEMOTHERAPY ALERT CARD or IMMUNOTHERAPY ALERT CARD at  check-in to the Emergency Department and triage nurse.  Should you have questions after your visit or need to cancel or reschedule your appointment, please contact [facility]  Dept: [phone]  and follow the prompts.  Office hours are 8:00 a.m. to 4:30 p.m. Monday - Friday. Please note that voicemails left after 4:00 p.m. may not be returned until the following business day.  We are closed weekends and major holidays. You have access to a nurse at all times for urgent questions. Please call the main number to the clinic Dept: [phone] and follow the prompts.   For any non-urgent questions, you may also contact your provider using MyChart. We now offer e-Visits for anyone 72 and older to request care online for non-urgent symptoms. For details visit [email or web address].   Also download the MyChart app! Go to the app store, search "MyChart", open the app, select [facility], and log in with your MyChart username and password. Carfilzomib Injection What is this medication? CARFILZOMIB (kar FILZ oh mib) treats multiple myeloma, a type of bone marrow cancer. It works by blocking a protein that causes cancer cells to grow and multiply. This helps to slow or stop the spread of cancer cells. This medicine may be used for other purposes; ask your health care provider or pharmacist if you have questions. COMMON BRAND NAME(S): KYPROLIS What should I tell my care team before I take this medication? They need to know if you have any of these conditions: Heart disease History of blood clots Irregular heartbeat Kidney disease Liver disease Lung or breathing disease An unusual or allergic reaction to  carfilzomib, or other medications, foods, dyes, or preservatives If you or your partner are pregnant or trying to get pregnant Breastfeeding How should I use this medication? This medication is injected into a vein. It is given by your care team in a hospital or clinic  setting. Talk to your care team about the use of this medication in children. Special care may be needed. Overdosage: If you think you have taken too much of this medicine contact a poison control center or emergency room at once. NOTE: This medicine is only for you. Do not share this medicine with others. What if I miss a dose? Keep appointments for follow-up doses. It is important not to miss your dose. Call your care team if you are unable to keep an appointment. What may interact with this medication? Interactions are not expected. This list may not describe all possible interactions. Give your health care provider a list of all the medicines, herbs, non-prescription drugs, or dietary supplements you use. Also tell them if you smoke, drink alcohol, or use illegal drugs. Some items may interact with your medicine. What should I watch for while using this medication? Your condition will be monitored carefully while you are receiving this medication. You may need blood work while taking this medication. Check with your care team if you have severe diarrhea, nausea, and vomiting, or if you sweat a lot. The loss of too much body fluid may make it dangerous for you to take this medication. This medication may affect your coordination, reaction time, or judgment. Do not drive or operate machinery until you know how this medication affects you. Sit up or stand slowly to reduce the risk of dizzy or fainting spells. Drinking alcohol with this medication can increase the risk of these side effects. Talk to your care team if you may be pregnant. Serious birth defects can occur if you take this medication during pregnancy and for 6 months after the last dose. You will need a negative pregnancy test before starting this medication. Contraception is recommended while taking this medication and for 6 months after the last dose. Your care team can help you find an option that works for you. If your partner can get  pregnant, use a condom during sex while taking this medication and for 3 months after the last dose. Do not breastfeed while taking this medication and for 2 weeks after the last dose. This medication may cause infertility. Talk to your care team if you are concerned about your fertility. What side effects may I notice from receiving this medication? Side effects that you should report to your care team as soon as possible: Allergic reactions--skin rash, itching, hives, swelling of the face, lips, tongue, or throat Bleeding--bloody or black, tar-like stools, vomiting blood or brown material that looks like coffee grounds, red or dark brown urine, small red or purple spots on skin, unusual bruising or bleeding Blood clot--pain, swelling, or warmth in the leg, shortness of breath, chest pain Dizziness, loss of balance or coordination, confusion or trouble speaking Heart attack--pain or tightness in the chest, shoulders, arms, or jaw, nausea, shortness of breath, cold or clammy skin, feeling faint or lightheaded Heart failure--shortness of breath, swelling of the ankles, feet, or hands, sudden weight gain, unusual weakness or fatigue Heart rhythm changes--fast or irregular heartbeat, dizziness, feeling faint or lightheaded, chest pain, trouble breathing Increase in blood pressure Infection--fever, chills, cough, sore throat, wounds that don't heal, pain or trouble when passing urine, general feeling of  discomfort or being unwell Infusion reactions--chest pain, shortness of breath or trouble breathing, feeling faint or lightheaded Kidney injury--decrease in the amount of urine, swelling of the ankles, hands, or feet Liver injury--right upper belly pain, loss of appetite, nausea, light-colored stool, dark yellow or brown urine, yellowing skin or eyes, unusual weakness or fatigue Lung injury--shortness of breath or trouble breathing, cough, spitting up blood, chest pain, fever Pulmonary  hypertension--shortness of breath, chest pain, fast or irregular heartbeat, feeling faint or lightheaded, fatigue, swelling of the ankles or feet Stomach pain, bloody diarrhea, pale skin, unusual weakness or fatigue, decrease in the amount of urine, which may be signs of hemolytic uremic syndrome Sudden and severe headache, confusion, change in vision, seizures, which may be signs of posterior reversible encephalopathy syndrome (PRES) TTP--purple spots on the skin or inside the mouth, pale skin, yellowing skin or eyes, unusual weakness or fatigue, fever, fast or irregular heartbeat, confusion, change in vision, trouble speaking, trouble walking Tumor lysis syndrome (TLS)--nausea, vomiting, diarrhea, decrease in the amount of urine, dark urine, unusual weakness or fatigue, confusion, muscle pain or cramps, fast or irregular heartbeat, joint pain Side effects that usually do not require medical attention (report to your care team if they continue or are bothersome): Diarrhea Fatigue Nausea Trouble sleeping This list may not describe all possible side effects. Call your doctor for medical advice about side effects. You may report side effects to FDA at 1-800-FDA-1088. Where should I keep my medication? This medication is given in a hospital or clinic. It will not be stored at home. NOTE: This sheet is a summary. It may not cover all possible information. If you have questions about this medicine, talk to your doctor, pharmacist, or health care provider.  2023 Elsevier/Gold Standard (2021-11-09 00:00:00)

## 2022-07-04 NOTE — Assessment & Plan Note (Signed)
She will continue calcium and vitamin D As above, we will hold Zometa Her mobility is expected to improve and she will discontinue Xarelto once her prescription is done She will continue recommendation per physical therapy

## 2022-07-04 NOTE — Telephone Encounter (Signed)
Oral Oncology Pharmacist Encounter  Received new prescription for cyclophosphamide (Cytoxan) for the treatment of relapsed multiple myeloma in conjunction with carfilzomib and dexamethasone, planned duration until disease progression or unacceptable toxicity.  Labs from 07/04/22 assessed, no interventions needed. Prescription dose and frequency assessed, dose determined by physician.   Current medication list in Epic reviewed, no significant DDIs with Cytoxan identified.  Evaluated chart and no patient barriers to medication adherence noted.   Patient agreement for treatment documented in MD note on 07/04/2022.  Prescription has been e-scribed to the [facility] for benefits analysis and approval.  Oral Oncology Clinic will continue to follow for insurance authorization, copayment issues, initial counseling and start date.  Drema Halon, PharmD Hematology/Oncology Clinical Pharmacist [facility] [phone] 07/04/2022 9:44 AM

## 2022-07-04 NOTE — Progress Notes (Signed)
[place] OFFICE PROGRESS NOTE  Patient Care Team: Default, Provider, MD as PCP - General [name], MD as PCP - Cardiology (Cardiology) [name], MD as Referring Physician (Hematology and Oncology) [name], MD as Consulting Physician (Internal Medicine) [name], [name], MD as Consulting Physician (Infectious Diseases) [name], MD as Consulting Physician (Hematology and Oncology) [name], NP as Nurse Practitioner (Hematology and Oncology) [name], MD as Consulting Physician (Hematology and Oncology)  ASSESSMENT & PLAN:  Multiple myeloma in relapse [facility]) I have reviewed recommendation from [facility] from November She is ready to resume chemotherapy Previously, she tolerated carfilzomib at 34 mg/m well We will continue this dose for now with plan to escalate in the future if she tolerates well In addition, we discussed the risk and benefits of addition of Cytoxan The dose that was recommended by her physician at [facility] was very high I would like to start her at 250 mg once a week on the same day that she takes her chemotherapy It will take me approximately a week to get insurance authorization and hopefully, we can get her Cytoxan available and ready to start next week The risk, benefits, side effects of chemotherapy were discussed and she is ready to proceed She will continue once a week dexamethasone at home Due to recent bone injury, I will hold off Zometa for now She will continue calcium and vitamin D supplement  Anemia due to antineoplastic chemotherapy She has history of anemia and now has anemia secondary to recent surgery She is not symptomatic She does not need transfusion support Monitor closely  Closed left ankle fracture She will continue calcium and vitamin D As above, we will hold Zometa Her mobility is expected to improve and she will discontinue Xarelto once her prescription is  done She will continue recommendation per physical therapy  Orders Placed This Encounter  Procedures   CBC with Differential ([place] Only)    Standing Status:   Future    Standing Expiration Date:   08/04/2023   CMP ([place] only)    Standing Status:   Future    Standing Expiration Date:   08/04/2023   CBC with Differential ([place] Only)    Standing Status:   Future    Standing Expiration Date:   08/11/2023   CMP ([place] only)    Standing Status:   Future    Standing Expiration Date:   08/11/2023   CBC with Differential ([place] Only)    Standing Status:   Future    Standing Expiration Date:   08/18/2023   CMP ([place] only)    Standing Status:   Future    Standing Expiration Date:   08/18/2023    All questions were answered. The patient knows to call the clinic with any problems, questions or concerns. The total time spent in the appointment was 40 minutes encounter with patients including review of chart and various tests results, discussions about plan of care and coordination of care plan   [name], MD 07/04/2022 9:38 AM  INTERVAL HISTORY: Please see below for problem oriented charting. she returns for treatment follow-up with her caregiver We discussed risk and benefits of chemotherapy She denies pain No bleeding complications from anticoagulation therapy Her ankle injury is healing well  REVIEW OF SYSTEMS:   Constitutional: Denies fevers, chills or abnormal weight loss Eyes: Denies blurriness of vision Ears, nose, mouth, throat, and face: Denies mucositis  or sore throat Respiratory: Denies cough, dyspnea or wheezes Cardiovascular: Denies palpitation, chest discomfort or lower extremity swelling Gastrointestinal:  Denies nausea, heartburn or change in bowel habits Skin: Denies abnormal skin rashes Lymphatics: Denies new lymphadenopathy or easy bruising Neurological:Denies numbness, tingling or new weaknesses Behavioral/Psych: Mood  is stable, no new changes  All other systems were reviewed with the patient and are negative.  I have reviewed the past medical history, past surgical history, social history and family history with the patient and they are unchanged from previous note.  ALLERGIES:  has No Known Allergies.  MEDICATIONS:  Current Outpatient Medications  Medication Sig Dispense Refill   cyclophosphamide (CYTOXAN) 50 MG capsule Take 5 capsules (250 mg total) by mouth once a week. Take with food to minimize GI upset. Take early in the day and maintain hydration. 20 capsule 11   acyclovir (ZOVIRAX) 400 MG tablet Take 1 tablet (400 mg total) by mouth 2 (two) times daily. 30 tablet 0   aspirin 325 MG tablet Take 325 mg by mouth daily.     calcium carbonate (TUMS - DOSED IN MG ELEMENTAL CALCIUM) 500 MG chewable tablet Chew 1 tablet by mouth daily.     Carfilzomib (KYPROLIS IV) Inject into the vein once a week.     Cholecalciferol 25 MCG (1000 UT) tablet Take 1,000 Units by mouth daily.     Cyanocobalamin (VITAMIN B12 PO) Take 1 tablet by mouth daily.     dexamethasone (DECADRON) 4 MG tablet Take 1 tablet (4 mg total) by mouth once a week. Take 1 tablet by mouth 1 hour before chemotherapy 5 tablet 5   loperamide (IMODIUM) 2 MG capsule Take by mouth as needed for diarrhea or loose stools. As needed only     metoprolol tartrate (LOPRESSOR) 25 MG tablet TAKE 1 AND 1/2 TABLETS BY MOUTH TWICE DAILY 90 tablet 0   mirtazapine (REMERON) 7.5 MG tablet Take 1 tablet (7.5 mg total) by mouth at bedtime. 90 tablet 0   Multiple Vitamin (MULTIVITAMIN WITH MINERALS) TABS tablet Take 1 tablet by mouth daily.     Multiple Vitamins-Minerals (PRESERVISION AREDS 2 PO) Take 1 tablet by mouth 2 (two) times daily.     ondansetron (ZOFRAN) 8 MG tablet Take 1 tablet (8 mg total) by mouth every 8 (eight) hours as needed (Nausea or vomiting). 30 tablet 1   pantoprazole (PROTONIX) 40 MG tablet TAKE 1 TABLET BY MOUTH 2 TIMES DAILY. TAKE 30-60  MINUTES BEFORE BREAKFAST AND DINNER. 180 tablet 2   No current facility-administered medications for this visit.    SUMMARY OF ONCOLOGIC HISTORY: Oncology History Overview Note  Multiple myeloma, Ig A Lambda, M spike 3.54 grams, Calcium 9.2, Creatinine 0.8, Beta 2 microglobulin 4.52, IgA 4840 mg/dL, lambda light chain 75.4, albumin 3.6, hemoglobin 9.7, platelet 115    Primary site: Multiple Myeloma   Staging method: AJCC 6th Edition   Clinical: Stage IIA signed by [name], MD on 11/07/2013  2:46 PM   Summary: Stage IIA S/p Allo transplant at [facility] on revlimid, pomalyst, daratumumab and velcade     Multiple myeloma in relapse ([place])  10/31/2013 Bone Marrow Biopsy   Bone marrow biopsy confirmed multiple myeloma with 40% bone marrow involvement. Skeletal survey showed minimal lesions in her score with generalized demineralization   11/10/2013 - 02/13/2014 Chemotherapy   The patient is started on induction chemotherapy with weekly dexamethasone 40 mg by mouth as well as Velcade subcutaneous injection on days 1, 4, 8 and 11.  On 11/21/2013, she was started on monthly Zometa.   12/23/2013 Adverse Reaction   The dose of Velcade was reduced due to thrombocytopenia.   01/28/2014 - 04/07/2014 Chemotherapy   Revlimid is added. Treatment was discontinued due to lack of response.   02/24/2014 - 04/07/2014 Chemotherapy   Due to worsening peripheral neuropathy, Velcade injection is changed to once a week. Revlimid was given 21 days on, 7 days off.   04/07/2014 - 04/10/2014 Chemotherapy   Revlimid was discontinued due to lack of response. Chemotherapy was changed back to Velcade injection twice a week, 2 weeks on 1 week off. Her treatment was switched to to minimum response   04/20/2014 - 06/02/2014 Chemotherapy   chemotherapy is switched to Carfilzomib, Cytoxan and dexamethasone.   04/22/2014 Procedure   she has placement of port for chemotherapy.   06/01/2014 Tumor Marker   Bloodwork  show that she has greater than partial response   06/23/2014 Bone Marrow Biopsy   Bone marrow biopsy show 5-10% residual plasma cells, normal cytogenetics and FISH   07/07/2014 Procedure   She had stem cell collection   07/22/2014 - 07/22/2014 Chemotherapy   She had high-dose chemotherapy with melphalan   07/23/2014 Bone Marrow Transplant   She had bone marrow transplant in autologous fashion at [facility]   10/20/2014 - 03/24/2015 Chemotherapy    she received chemotherapy with Kyprolis, Revlimid and dexamethasone   10/22/2014 Procedure   She has port placement   01/19/2015 Tumor Marker   IgA lambda M spike at 0.4 g    01/20/2015 Miscellaneous   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 No cource was found and fever resolved with IV vancomycin and meropenem   09/13/2015 Bone Marrow Biopsy   Bone marrow biopsy showed no increased blasts, 3-4 % plasma cells   03/02/2016 Bone Marrow Biopsy   Bone marrow biopsy at [facility] showed normocellular (30%) bone marrow with trilineage hematopoiesis. No significant increase in blasts. No significant increase in plasma cells.   05/12/2016 Imaging   DEXA scan at [place] showed osteopenia   10/24/2016 Imaging   Skeletal survey at [facility], no new lesions   12/07/2017 Imaging   No focal abnormality noted to suggest myeloma. Exam is stable from prior exam.   03/01/2018 Procedure   Successful 8 French right internal jugular vein power port placement with its tip at the SVC/RA junction.   03/06/2018 -  Chemotherapy   The  patient had daratumumab    07/09/2018 Bone Marrow Biopsy   Bone marrow biopsy at [facility] showed residual disease at 0.004% plasma cells   07/03/2019 Bone Marrow Biopsy   A. Bone marrow, flow cytometric analysis for multiple myeloma minimal residual disease detection:   Negative. No phenotypically abnormal plasma cells at or above the limit of detection identified.     No monotypic B-cell population identified. Negative for increased blasts.   03/25/2021 Bone Marrow Biopsy   Repeat bone marrow biopsy at [place] showed female donor karyotype.  5 to 7% plasma cell is seen.  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   04/20/2021 Echocardiogram   1. Left ventricular ejection fraction, by estimation, is 60 to 65%. The left ventricle has normal function. The left ventricle has no regional wall motion abnormalities. Left ventricular diastolic parameters were normal.  2. Right ventricular systolic function is normal. The right ventricular size is normal.  3. The mitral valve is normal in structure. No evidence of mitral valve regurgitation. No evidence of mitral stenosis.  4. The aortic valve is normal in structure. Aortic valve regurgitation is not visualized. No aortic stenosis is present.  5. The inferior vena cava is normal in size with greater than 50% respiratory variability, suggesting right atrial pressure of 3 mmHg.   04/29/2021 - 02/17/2022 Chemotherapy   Patient is on Treatment Plan : MYELOMA RELAPSED/ REFRACTORY Carfilzomib D1,8,15 (20/27) + Dexamethasone (KPd) q28d     04/30/2021 -  Chemotherapy   Patient is on Treatment Plan : MYELOMA RELAPSED/REFRACTORY Carfilzomib (20/70) D1,8,15 + Dexamethasone weekly (40) (Kd) q28d  x 9 cycles / Dexamethasone D1,8,15     05/04/2021 PET scan   1. No evidence active multiple myeloma within the  skeleton on FDG PET scan. 2. No suspicious lytic lesion on CT portion exam. 3. No plasmacytoma.   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   10/31/2015 Miscellaneous   She has engrafted     PHYSICAL EXAMINATION: ECOG PERFORMANCE STATUS: 2 - Symptomatic, <50% confined to bed  Vitals:   07/04/22 0903  BP: (!) 150/48  Pulse: 62  Resp: 18  Temp: (!) 97.4 F (36.3 C)  SpO2: 99%   There were no vitals filed for this visit.  GENERAL:alert, no distress and comfortable  NEURO: alert & oriented x 3 with fluent speech, no focal motor/sensory deficits  LABORATORY DATA:  I have reviewed the data as listed    Component Value Date/Time   NA 142 06/16/2022 1054   NA 142 01/04/2017 0902   K 3.6 06/16/2022 1054   K 3.9 01/04/2017 0902   CL 106 06/16/2022 1054   CO2 29 06/16/2022 1054   CO2 28 01/04/2017 0902   GLUCOSE 101 (H) 06/16/2022 1054   GLUCOSE 92 01/04/2017 0902   BUN 16 06/16/2022 1054   BUN 9.5 01/04/2017 0902   CREATININE 0.70 06/16/2022 1054   CREATININE 0.74 04/14/2022 0729   CREATININE 0.7 01/04/2017 0902   CALCIUM 9.3 06/16/2022 1054   CALCIUM 9.0 01/04/2017 0902   PROT 7.5 06/16/2022 1054   PROT 6.0 (L) 01/04/2017 0902   PROT 5.8 (L) 01/04/2017 0902   ALBUMIN 3.7 06/16/2022 1054   ALBUMIN 3.4 (L) 01/04/2017 0902   AST 11 (L) 06/16/2022 1054   AST 23 04/14/2022 0729   AST  21 01/04/2017 0902   ALT 6 06/16/2022 1054   ALT 14 04/14/2022 0729   ALT 16 01/04/2017 0902   ALKPHOS  86 06/16/2022 1054   ALKPHOS 101 01/04/2017 0902   BILITOT 0.3 06/16/2022 1054   BILITOT 0.2 (L) 04/14/2022 0729   BILITOT 0.56 01/04/2017 0902   GFRNONAA >60 06/16/2022 1054   GFRNONAA >60 04/14/2022 0729   GFRAA >60 03/25/2020 0917   GFRAA >60 12/18/2018 0901    No results found for: "SPEP", "UPEP"  Lab Results  Component Value Date   WBC 6.5 07/04/2022   NEUTROABS 4.4 07/04/2022   HGB 9.5 (L) 07/04/2022   HCT 30.3 (L) 07/04/2022   MCV 99.7 07/04/2022   PLT 389 07/04/2022      Chemistry      Component Value Date/Time   NA 142 06/16/2022 1054   NA 142 01/04/2017 0902   K 3.6 06/16/2022 1054   K 3.9 01/04/2017 0902   CL 106 06/16/2022 1054   CO2 29 06/16/2022 1054   CO2 28 01/04/2017 0902   BUN 16 06/16/2022 1054   BUN 9.5 01/04/2017 0902   CREATININE 0.70 06/16/2022 1054   CREATININE 0.74 04/14/2022 0729   CREATININE 0.7 01/04/2017 0902      Component Value Date/Time   CALCIUM 9.3 06/16/2022 1054   CALCIUM 9.0 01/04/2017 0902   ALKPHOS 86 06/16/2022 1054   ALKPHOS 101 01/04/2017 0902   AST 11 (L) 06/16/2022 1054   AST 23 04/14/2022 0729   AST 21 01/04/2017 0902   ALT 6 06/16/2022 1054   ALT 14 04/14/2022 0729   ALT 16 01/04/2017 0902   BILITOT 0.3 06/16/2022 1054   BILITOT 0.2 (L) 04/14/2022 0729   BILITOT 0.56 01/04/2017 0902

## 2022-07-04 NOTE — Telephone Encounter (Signed)
Oral Oncology Patient Advocate Encounter   Received notification that prior authorization for Cyclophosphamide is required.   PA submitted on 07/04/22 Key [id number] Status is pending     Jean Davidson, CPhT-Adv Oncology Pharmacy Patient [place] Direct Number: [phone]  Fax: [phone]

## 2022-07-04 NOTE — Telephone Encounter (Signed)
Oral Oncology Patient Advocate Encounter   Received notification that prior authorization for Cyclophosphamide is required through [place] submitted on 07/04/22 Key [id number] Status is pending     Lady Deutscher, CPhT-Adv Oncology Pharmacy Patient [place] Direct Number: [phone]  Fax: [phone]

## 2022-07-04 NOTE — Telephone Encounter (Signed)
Oral Oncology Patient Advocate Encounter  Received notification that the request for prior authorization for Cyclophosphamide has been denied due to medication may be covered under part B.     Lady Deutscher, CPhT-Adv Oncology Pharmacy Patient [place] Direct Number: [phone]  Fax: [phone]

## 2022-07-04 NOTE — Assessment & Plan Note (Signed)
She has history of anemia and now has anemia secondary to recent surgery She is not symptomatic She does not need transfusion support Monitor closely

## 2022-07-05 ENCOUNTER — Other Ambulatory Visit (HOSPITAL_COMMUNITY): Payer: Self-pay

## 2022-07-05 ENCOUNTER — Encounter: Payer: Self-pay | Admitting: Hematology and Oncology

## 2022-07-05 MED ORDER — CYCLOPHOSPHAMIDE 50 MG PO CAPS: 250.0000 mg | ORAL_CAPSULE | ORAL | 11 refills | Status: DC

## 2022-07-05 NOTE — Telephone Encounter (Signed)
Oral Oncology Patient Advocate Encounter  Prior Authorization for Cyclophosphamide has been approved.    PA# [id number] Effective dates: 07/04/22 through 07/05/23  Patient must fill at [facility].    Lady Deutscher, CPhT-Adv Oncology Pharmacy Patient [place] Direct Number: [phone]  Fax: [phone]

## 2022-07-13 ENCOUNTER — Inpatient Hospital Stay: Payer: Medicare HMO

## 2022-07-13 ENCOUNTER — Encounter: Payer: Self-pay | Admitting: Hematology and Oncology

## 2022-07-13 ENCOUNTER — Inpatient Hospital Stay (HOSPITAL_BASED_OUTPATIENT_CLINIC_OR_DEPARTMENT_OTHER): Payer: Medicare HMO | Admitting: Hematology and Oncology

## 2022-07-13 VITALS — Temp 97.7°F

## 2022-07-13 VITALS — BP 145/62 | HR 64 | Resp 18

## 2022-07-13 DIAGNOSIS — C9002 Multiple myeloma in relapse: Secondary | ICD-10-CM | POA: Diagnosis not present

## 2022-07-13 DIAGNOSIS — S82892A Other fracture of left lower leg, initial encounter for closed fracture: Secondary | ICD-10-CM

## 2022-07-13 DIAGNOSIS — D6481 Anemia due to antineoplastic chemotherapy: Secondary | ICD-10-CM | POA: Diagnosis not present

## 2022-07-13 DIAGNOSIS — Z5111 Encounter for antineoplastic chemotherapy: Secondary | ICD-10-CM | POA: Diagnosis not present

## 2022-07-13 DIAGNOSIS — T451X5A Adverse effect of antineoplastic and immunosuppressive drugs, initial encounter: Secondary | ICD-10-CM

## 2022-07-13 DIAGNOSIS — D469 Myelodysplastic syndrome, unspecified: Secondary | ICD-10-CM

## 2022-07-13 LAB — CMP (CANCER CENTER ONLY)
ALT: 6 U/L (ref 0–44)
AST: 11 U/L — ABNORMAL LOW (ref 15–41)
Albumin: 3.5 g/dL (ref 3.5–5.0)
Alkaline Phosphatase: 73 U/L (ref 38–126)
Anion gap: 8 (ref 5–15)
BUN: 14 mg/dL (ref 8–23)
CO2: 27 mmol/L (ref 22–32)
Calcium: 9.3 mg/dL (ref 8.9–10.3)
Chloride: 104 mmol/L (ref 98–111)
Creatinine: 0.68 mg/dL (ref 0.44–1.00)
GFR, Estimated: >60 mL/min (ref >60)
Glucose, Bld: 96 mg/dL (ref 70–99)
Potassium: 4.1 mmol/L (ref 3.5–5.1)
Sodium: 139 mmol/L (ref 135–145)
Total Bilirubin: 0.2 mg/dL — ABNORMAL LOW (ref 0.3–1.2)
Total Protein: 7.4 g/dL (ref 6.5–8.1)

## 2022-07-13 LAB — CBC WITH DIFFERENTIAL (CANCER CENTER ONLY)
Abs Immature Granulocytes: 0.03 10*3/uL (ref 0.00–0.07)
Basophils Absolute: 0 10*3/uL (ref 0.0–0.1)
Basophils Relative: 0 %
Eosinophils Absolute: 0 10*3/uL (ref 0.0–0.5)
Eosinophils Relative: 0 %
HCT: 29.4 % — ABNORMAL LOW (ref 36.0–46.0)
Hemoglobin: 9.2 g/dL — ABNORMAL LOW (ref 12.0–15.0)
Immature Granulocytes: 0 %
Lymphocytes Relative: 16 %
Lymphs Abs: 1.1 10*3/uL (ref 0.7–4.0)
MCH: 31.1 pg (ref 26.0–34.0)
MCHC: 31.3 g/dL (ref 30.0–36.0)
MCV: 99.3 fL (ref 80.0–100.0)
Monocytes Absolute: 0.9 10*3/uL (ref 0.1–1.0)
Monocytes Relative: 13 %
Neutro Abs: 4.8 10*3/uL (ref 1.7–7.7)
Neutrophils Relative %: 71 %
Platelet Count: 342 10*3/uL (ref 150–400)
RBC: 2.96 MIL/uL — ABNORMAL LOW (ref 3.87–5.11)
RDW: 14 % (ref 11.5–15.5)
WBC Count: 6.9 10*3/uL (ref 4.0–10.5)
nRBC: 0 % (ref 0.0–0.2)

## 2022-07-13 LAB — SAMPLE TO BLOOD BANK

## 2022-07-13 MED ORDER — SODIUM CHLORIDE 0.9 % IV SOLN: Freq: Once | INTRAVENOUS | Status: AC

## 2022-07-13 MED ORDER — SODIUM CHLORIDE 0.9% FLUSH
10.0000 mL | INTRAVENOUS | Status: DC | PRN
  Administered 2022-07-13: 10 mL

## 2022-07-13 MED ORDER — DEXAMETHASONE 4 MG PO TABS: 4.0000 mg | ORAL_TABLET | ORAL | 5 refills | Status: DC

## 2022-07-13 MED ORDER — DEXTROSE 5 % IV SOLN: 34.0000 mg/m2 | Freq: Once | INTRAVENOUS | Status: DC

## 2022-07-13 MED ORDER — DEXTROSE 5 % IV SOLN
34.0000 mg/m2 | Freq: Once | INTRAVENOUS | Status: AC
  Administered 2022-07-13: 60 mg via INTRAVENOUS
  Filled 2022-07-13: qty 30

## 2022-07-13 MED ORDER — SODIUM CHLORIDE 0.9% FLUSH
10.0000 mL | Freq: Once | INTRAVENOUS | Status: AC
  Administered 2022-07-13: 10 mL

## 2022-07-13 MED ORDER — HEPARIN SOD (PORK) LOCK FLUSH 100 UNIT/ML IV SOLN
500.0000 [IU] | Freq: Once | INTRAVENOUS | Status: AC | PRN
  Administered 2022-07-13: 500 [IU]

## 2022-07-13 NOTE — Assessment & Plan Note (Signed)
She denies side effects from treatment recently We are able to get Cytoxan approved and she will start as soon as she receives the medication I prefer her to start in the morning We discussed importance of hydration to reduce risk of cystitis She would likely become more anemic and we will monitor her blood counts carefully We will continue myeloma panel once a month I will see her again next month for further follow-up

## 2022-07-13 NOTE — Patient Instructions (Signed)
[facility] ONCOL[facility]  Discharge Instructions: Thank you for choosing [facility] to provide your oncology and hematology care.   If you have a lab appointment with the [facility], please go directly to the [facility] and check in at the registration area.   Wear comfortable clothing and clothing appropriate for easy access to any Portacath or PICC line.   We strive to give you quality time with your provider. You may need to reschedule your appointment if you arrive late (15 or more minutes).  Arriving late affects you and other patients whose appointments are after yours.  Also, if you miss three or more appointments without notifying the office, you may be dismissed from the clinic at the provider's discretion.      For prescription refill requests, have your pharmacy contact our office and allow 72 hours for refills to be completed.    Today you received the following chemotherapy and/or immunotherapy agents: Kyprolis      To help prevent nausea and vomiting after your treatment, we encourage you to take your nausea medication as directed.  BELOW ARE SYMPTOMS THAT SHOULD BE REPORTED IMMEDIATELY: *FEVER GREATER THAN 100.4 F (38 C) OR HIGHER *CHILLS OR SWEATING *NAUSEA AND VOMITING THAT IS NOT CONTROLLED WITH YOUR NAUSEA MEDICATION *UNUSUAL SHORTNESS OF BREATH *UNUSUAL BRUISING OR BLEEDING *URINARY PROBLEMS (pain or burning when urinating, or frequent urination) *BOWEL PROBLEMS (unusual diarrhea, constipation, pain near the anus) TENDERNESS IN MOUTH AND THROAT WITH OR WITHOUT PRESENCE OF ULCERS (sore throat, sores in mouth, or a toothache) UNUSUAL RASH, SWELLING OR PAIN  UNUSUAL VAGINAL DISCHARGE OR ITCHING   Items with * indicate a potential emergency and should be followed up as soon as possible or go to the Emergency Department if any problems should occur.  Please show the CHEMOTHERAPY ALERT CARD or IMMUNOTHERAPY ALERT CARD at check-in to  the Emergency Department and triage nurse.  Should you have questions after your visit or need to cancel or reschedule your appointment, please contact [facility]  Dept: [phone]  and follow the prompts.  Office hours are 8:00 a.m. to 4:30 p.m. Monday - Friday. Please note that voicemails left after 4:00 p.m. may not be returned until the following business day.  We are closed weekends and major holidays. You have access to a nurse at all times for urgent questions. Please call the main number to the clinic Dept: [phone] and follow the prompts.   For any non-urgent questions, you may also contact your provider using MyChart. We now offer e-Visits for anyone 28 and older to request care online for non-urgent symptoms. For details visit [email or web address].   Also download the MyChart app! Go to the app store, search "MyChart", open the app, select [facility], and log in with your MyChart username and password.

## 2022-07-13 NOTE — Assessment & Plan Note (Signed)
She will start weightbearing exercises tomorrow She will continue aggressive physical therapy and rehab We discussed risk of poor wound healing while on chemotherapy

## 2022-07-13 NOTE — Progress Notes (Signed)
[place] OFFICE PROGRESS NOTE  Patient Care Team: Default, Provider, MD as PCP - General [name], MD as PCP - Cardiology (Cardiology) [name], MD as Referring Physician (Hematology and Oncology) [name], MD as Consulting Physician (Internal Medicine) [name], [name], MD as Consulting Physician (Infectious Diseases) [name], MD as Consulting Physician (Hematology and Oncology) [name], NP as Nurse Practitioner (Hematology and Oncology) [name], MD as Consulting Physician (Hematology and Oncology)  ASSESSMENT & PLAN:  Multiple myeloma in relapse [facility]) She denies side effects from treatment recently We are able to get Cytoxan approved and she will start as soon as she receives the medication I prefer her to start in the morning We discussed importance of hydration to reduce risk of cystitis She would likely become more anemic and we will monitor her blood counts carefully We will continue myeloma panel once a month I will see her again next month for further follow-up  Anemia due to antineoplastic chemotherapy She has history of anemia and now has anemia secondary to recent surgery She is not symptomatic She does not need transfusion support Monitor closely  Closed left ankle fracture She will start weightbearing exercises tomorrow She will continue aggressive physical therapy and rehab We discussed risk of poor wound healing while on chemotherapy  No orders of the defined types were placed in this encounter.   All questions were answered. The patient knows to call the clinic with any problems, questions or concerns. The total time spent in the appointment was 30 minutes encounter with patients including review of chart and various tests results, discussions about plan of care and coordination of care plan   [name], MD 07/13/2022 4:22 PM  INTERVAL HISTORY: Please see below for problem  oriented charting. she returns for treatment follow-up with her caregiver We discussed the risk, benefits, side effects of Cytoxan So far, she is doing well since her last visit here She denies worsening bone pain She will be starting on weightbearing exercise tomorrow Denies recent infection  REVIEW OF SYSTEMS:   Constitutional: Denies fevers, chills or abnormal weight loss Eyes: Denies blurriness of vision Ears, nose, mouth, throat, and face: Denies mucositis or sore throat Respiratory: Denies cough, dyspnea or wheezes Cardiovascular: Denies palpitation, chest discomfort or lower extremity swelling Gastrointestinal:  Denies nausea, heartburn or change in bowel habits Skin: Denies abnormal skin rashes Lymphatics: Denies new lymphadenopathy or easy bruising Neurological:Denies numbness, tingling or new weaknesses Behavioral/Psych: Mood is stable, no new changes  All other systems were reviewed with the patient and are negative.  I have reviewed the past medical history, past surgical history, social history and family history with the patient and they are unchanged from previous note.  ALLERGIES:  has No Known Allergies.  MEDICATIONS:  Current Outpatient Medications  Medication Sig Dispense Refill   acyclovir (ZOVIRAX) 400 MG tablet Take 1 tablet (400 mg total) by mouth 2 (two) times daily. 30 tablet 0   aspirin 325 MG tablet Take 325 mg by mouth daily.     calcium carbonate (TUMS - DOSED IN MG ELEMENTAL CALCIUM) 500 MG chewable tablet Chew 1 tablet by mouth daily.     Carfilzomib (KYPROLIS IV) Inject into the vein once a week.     Cholecalciferol 25 MCG (1000 UT) tablet Take 1,000 Units by mouth daily.     Cyanocobalamin (VITAMIN B12 PO) Take 1 tablet by mouth daily.     cyclophosphamide (CYTOXAN) 50 MG  capsule Take 5 capsules (250 mg total) by mouth once a week. Take with food to minimize GI upset. Take early in the day and maintain hydration. 20 capsule 11   dexamethasone  (DECADRON) 4 MG tablet Take 1 tablet (4 mg total) by mouth once a week. Take 1 tablet by mouth 1 hour before chemotherapy 20 tablet 5   loperamide (IMODIUM) 2 MG capsule Take by mouth as needed for diarrhea or loose stools. As needed only     metoprolol tartrate (LOPRESSOR) 25 MG tablet TAKE 1 AND 1/2 TABLETS BY MOUTH TWICE DAILY 90 tablet 0   mirtazapine (REMERON) 7.5 MG tablet Take 1 tablet (7.5 mg total) by mouth at bedtime. 90 tablet 0   Multiple Vitamin (MULTIVITAMIN WITH MINERALS) TABS tablet Take 1 tablet by mouth daily.     Multiple Vitamins-Minerals (PRESERVISION AREDS 2 PO) Take 1 tablet by mouth 2 (two) times daily.     ondansetron (ZOFRAN) 8 MG tablet Take 1 tablet (8 mg total) by mouth every 8 (eight) hours as needed (Nausea or vomiting). 30 tablet 1   pantoprazole (PROTONIX) 40 MG tablet TAKE 1 TABLET BY MOUTH 2 TIMES DAILY. TAKE 30-60 MINUTES BEFORE BREAKFAST AND DINNER. 180 tablet 2   No current facility-administered medications for this visit.    SUMMARY OF ONCOLOGIC HISTORY: Oncology History Overview Note  Multiple myeloma, Ig A Lambda, M spike 3.54 grams, Calcium 9.2, Creatinine 0.8, Beta 2 microglobulin 4.52, IgA 4840 mg/dL, lambda light chain 75.4, albumin 3.6, hemoglobin 9.7, platelet 115    Primary site: Multiple Myeloma   Staging method: AJCC 6th Edition   Clinical: Stage IIA signed by [name], MD on 11/07/2013  2:46 PM   Summary: Stage IIA S/p Allo transplant at [facility] on revlimid, pomalyst, daratumumab and velcade     Multiple myeloma in relapse ([place])  10/31/2013 Bone Marrow Biopsy   Bone marrow biopsy confirmed multiple myeloma with 40% bone marrow involvement. Skeletal survey showed minimal lesions in her score with generalized demineralization   11/10/2013 - 02/13/2014 Chemotherapy   The patient is started on induction chemotherapy with weekly dexamethasone 40 mg by mouth as well as Velcade subcutaneous injection on days 1, 4, 8 and 11. On 11/21/2013,  she was started on monthly Zometa.   12/23/2013 Adverse Reaction   The dose of Velcade was reduced due to thrombocytopenia.   01/28/2014 - 04/07/2014 Chemotherapy   Revlimid is added. Treatment was discontinued due to lack of response.   02/24/2014 - 04/07/2014 Chemotherapy   Due to worsening peripheral neuropathy, Velcade injection is changed to once a week. Revlimid was given 21 days on, 7 days off.   04/07/2014 - 04/10/2014 Chemotherapy   Revlimid was discontinued due to lack of response. Chemotherapy was changed back to Velcade injection twice a week, 2 weeks on 1 week off. Her treatment was switched to to minimum response   04/20/2014 - 06/02/2014 Chemotherapy   chemotherapy is switched to Carfilzomib, Cytoxan and dexamethasone.   04/22/2014 Procedure   she has placement of port for chemotherapy.   06/01/2014 Tumor Marker   Bloodwork show that she has greater than partial response   06/23/2014 Bone Marrow Biopsy   Bone marrow biopsy show 5-10% residual plasma cells, normal cytogenetics and FISH   07/07/2014 Procedure   She had stem cell collection   07/22/2014 - 07/22/2014 Chemotherapy   She had high-dose chemotherapy with melphalan   07/23/2014 Bone Marrow Transplant   She had bone marrow transplant in autologous  fashion at [facility]   10/20/2014 - 03/24/2015 Chemotherapy    she received chemotherapy with Kyprolis, Revlimid and dexamethasone   10/22/2014 Procedure   She has port placement   01/19/2015 Tumor Marker   IgA lambda M spike at 0.4 g    01/20/2015 Miscellaneous   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 No cource was found and fever resolved with IV vancomycin and meropenem   09/13/2015 Bone Marrow Biopsy   Bone marrow biopsy showed no increased blasts, 3-4 % plasma cells   03/02/2016 Bone Marrow Biopsy   Bone marrow biopsy at [place] showed normocellular (30%) bone marrow with trilineage hematopoiesis. No significant increase in blasts. No significant increase in plasma cells.   05/12/2016 Imaging   DEXA scan at [place] showed osteopenia   10/24/2016 Imaging   Skeletal survey at [facility], no new lesions   12/07/2017 Imaging   No focal abnormality noted to suggest myeloma. Exam is stable from prior exam.   03/01/2018 Procedure   Successful 8 French right internal jugular vein power port placement with its tip at the SVC/RA junction.   03/06/2018 -  Chemotherapy   The patient had daratumumab    07/09/2018 Bone Marrow Biopsy   Bone marrow biopsy at [facility] showed residual disease at 0.004% plasma cells   07/03/2019 Bone Marrow Biopsy   A. Bone marrow, flow cytometric analysis for multiple myeloma minimal residual disease detection:   Negative. No phenotypically abnormal plasma cells at or above the limit of detection identified.     No monotypic B-cell population identified. Negative for increased blasts.   03/25/2021 Bone Marrow Biopsy   Repeat bone marrow biopsy at [place] showed female donor karyotype.  5 to 7% plasma cell is seen.  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   04/20/2021 Echocardiogram   1. Left ventricular ejection fraction, by estimation, is 60 to 65%. The left ventricle has normal function. The left ventricle has no regional wall motion abnormalities. Left ventricular diastolic parameters were normal.  2. Right ventricular systolic function is  normal. The right ventricular size is normal.  3. The mitral valve is normal in structure. No evidence of mitral valve regurgitation. No evidence of mitral stenosis.  4. The aortic valve is normal in structure. Aortic valve regurgitation is not visualized. No aortic stenosis is present.  5. The inferior vena cava is normal in size with greater than 50% respiratory variability, suggesting right atrial pressure of 3 mmHg.   04/29/2021 - 02/17/2022 Chemotherapy   Patient is on Treatment Plan : MYELOMA RELAPSED/ REFRACTORY Carfilzomib D1,8,15 (20/27) + Dexamethasone (KPd) q28d     04/30/2021 -  Chemotherapy   Patient is on Treatment Plan : MYELOMA RELAPSED/REFRACTORY Carfilzomib (20/70) D1,8,15 + Dexamethasone weekly (40) (Kd) q28d  x 9 cycles / Dexamethasone D1,8,15     05/04/2021 PET scan   1. No evidence active multiple myeloma within  the skeleton on FDG PET scan. 2. No suspicious lytic lesion on CT portion exam. 3. No plasmacytoma.   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   10/31/2015 Miscellaneous   She has engrafted     PHYSICAL EXAMINATION: ECOG PERFORMANCE STATUS: 1 - Symptomatic but completely ambulatory  Vitals:   07/13/22 0910  BP: (!) 145/62  Pulse: 64  Resp: 18  SpO2: 100%   There were no vitals filed for this visit.  NEURO: alert & oriented x 3 with fluent speech, no focal  motor/sensory deficits  LABORATORY DATA:  I have reviewed the data as listed    Component Value Date/Time   NA 139 07/13/2022 0840   NA 142 01/04/2017 0902   K 4.1 07/13/2022 0840   K 3.9 01/04/2017 0902   CL 104 07/13/2022 0840   CO2 27 07/13/2022 0840   CO2 28 01/04/2017 0902   GLUCOSE 96 07/13/2022 0840   GLUCOSE 92 01/04/2017 0902   BUN 14 07/13/2022 0840   BUN 9.5 01/04/2017 0902   CREATININE 0.68 07/13/2022 0840   CREATININE 0.7 01/04/2017 0902   CALCIUM 9.3 07/13/2022 0840   CALCIUM 9.0 01/04/2017 0902   PROT 7.4 07/13/2022 0840   PROT 6.0 (L) 01/04/2017 0902   PROT 5.8 (L) 01/04/2017 0902   ALBUMIN 3.5 07/13/2022 0840   ALBUMIN 3.4 (L) 01/04/2017 0902   AST 11 (L) 07/13/2022 0840   AST 21 01/04/2017 0902   ALT 6 07/13/2022 0840   ALT 16 01/04/2017 0902   ALKPHOS 73 07/13/2022 0840   ALKPHOS 101 01/04/2017 0902   BILITOT 0.2 (L) 07/13/2022 0840   BILITOT 0.56 01/04/2017 0902   GFRNONAA >60 07/13/2022 0840   GFRAA >60 03/25/2020 0917   GFRAA >60 12/18/2018 0901    No results found for: "SPEP", "UPEP"  Lab Results  Component Value Date   WBC 6.9 07/13/2022   NEUTROABS 4.8 07/13/2022   HGB 9.2 (L) 07/13/2022   HCT 29.4 (L) 07/13/2022   MCV 99.3 07/13/2022   PLT 342 07/13/2022      Chemistry      Component Value Date/Time   NA 139 07/13/2022 0840   NA 142 01/04/2017 0902   K 4.1 07/13/2022 0840   K 3.9 01/04/2017 0902   CL 104 07/13/2022 0840   CO2 27 07/13/2022 0840   CO2 28 01/04/2017 0902   BUN 14 07/13/2022 0840   BUN 9.5 01/04/2017 0902   CREATININE 0.68 07/13/2022 0840   CREATININE 0.7 01/04/2017 0902      Component Value Date/Time   CALCIUM 9.3 07/13/2022 0840   CALCIUM 9.0 01/04/2017 0902   ALKPHOS 73 07/13/2022 0840   ALKPHOS 101 01/04/2017 0902   AST 11 (L) 07/13/2022 0840   AST 21 01/04/2017 0902   ALT 6 07/13/2022 0840   ALT 16 01/04/2017 0902   BILITOT 0.2 (L) 07/13/2022 0840   BILITOT 0.56 01/04/2017 0902

## 2022-07-13 NOTE — Assessment & Plan Note (Signed)
She has history of anemia and now has anemia secondary to recent surgery She is not symptomatic She does not need transfusion support Monitor closely

## 2022-07-14 ENCOUNTER — Telehealth: Payer: Self-pay

## 2022-07-14 LAB — KAPPA/LAMBDA LIGHT CHAINS
Kappa free light chain: 10.4 mg/L (ref 3.3–19.4)
Kappa, lambda light chain ratio: 0.02 — ABNORMAL LOW (ref 0.26–1.65)
Lambda free light chains: 528.1 mg/L — ABNORMAL HIGH (ref 5.7–26.3)

## 2022-07-14 NOTE — Telephone Encounter (Signed)
Returned her call. She is asking if she can add a little juice and tea to water. Told her yes, but only count decaf tea towards her 64 ounces. She verbalized understanding.

## 2022-07-17 ENCOUNTER — Other Ambulatory Visit: Payer: Self-pay

## 2022-07-17 ENCOUNTER — Other Ambulatory Visit (HOSPITAL_COMMUNITY): Payer: Self-pay

## 2022-07-17 ENCOUNTER — Telehealth: Payer: Self-pay

## 2022-07-17 DIAGNOSIS — C9002 Multiple myeloma in relapse: Secondary | ICD-10-CM

## 2022-07-17 MED ORDER — ONDANSETRON HCL 8 MG PO TABS: 8.0000 mg | ORAL_TABLET | Freq: Three times a day (TID) | ORAL | 1 refills | Status: DC | PRN

## 2022-07-17 NOTE — Telephone Encounter (Signed)
Returned her call. She had dry heaves and nausea on Saturday. Requesting Zofran Rx to pharmacy. Sent Rx to her preferred pharmacy.

## 2022-07-18 ENCOUNTER — Telehealth: Payer: Self-pay

## 2022-07-18 ENCOUNTER — Ambulatory Visit: Payer: Medicare HMO | Admitting: Hematology and Oncology

## 2022-07-18 ENCOUNTER — Other Ambulatory Visit: Payer: Medicare HMO

## 2022-07-18 LAB — MULTIPLE MYELOMA PANEL, SERUM
Albumin SerPl Elph-Mcnc: 3 g/dL (ref 2.9–4.4)
Albumin/Glob SerPl: 0.8 (ref 0.7–1.7)
Alpha 1: 0.3 g/dL (ref 0.0–0.4)
Alpha2 Glob SerPl Elph-Mcnc: 1 g/dL (ref 0.4–1.0)
B-Globulin SerPl Elph-Mcnc: 0.9 g/dL (ref 0.7–1.3)
Gamma Glob SerPl Elph-Mcnc: 1.8 g/dL (ref 0.4–1.8)
Globulin, Total: 4 g/dL — ABNORMAL HIGH (ref 2.2–3.9)
IgA: 1191 mg/dL — ABNORMAL HIGH (ref 64–422)
IgG (Immunoglobin G), Serum: 625 mg/dL (ref 586–1602)
IgM (Immunoglobulin M), Srm: 34 mg/dL (ref 26–217)
M Protein SerPl Elph-Mcnc: 1.2 g/dL — ABNORMAL HIGH
Total Protein ELP: 7 g/dL (ref 6.0–8.5)

## 2022-07-18 NOTE — Telephone Encounter (Signed)
Returned her call. She is concerned about her M protein and IgA going up and will call [facility] tomorrow for recommendations. Per Dr. Alvy Bimler told that it can take 1-2 months for her labs to come down. She verbalized understanding.

## 2022-07-20 ENCOUNTER — Inpatient Hospital Stay: Payer: Medicare HMO

## 2022-07-20 VITALS — BP 128/64 | HR 72 | Temp 98.8°F | Resp 18 | Wt 158.0 lb

## 2022-07-20 DIAGNOSIS — D469 Myelodysplastic syndrome, unspecified: Secondary | ICD-10-CM

## 2022-07-20 DIAGNOSIS — C9002 Multiple myeloma in relapse: Secondary | ICD-10-CM

## 2022-07-20 DIAGNOSIS — Z5111 Encounter for antineoplastic chemotherapy: Secondary | ICD-10-CM | POA: Diagnosis not present

## 2022-07-20 LAB — CBC WITH DIFFERENTIAL (CANCER CENTER ONLY)
Abs Immature Granulocytes: 0.01 10*3/uL (ref 0.00–0.07)
Basophils Absolute: 0 10*3/uL (ref 0.0–0.1)
Basophils Relative: 0 %
Eosinophils Absolute: 0 10*3/uL (ref 0.0–0.5)
Eosinophils Relative: 0 %
HCT: 27.5 % — ABNORMAL LOW (ref 36.0–46.0)
Hemoglobin: 8.4 g/dL — ABNORMAL LOW (ref 12.0–15.0)
Immature Granulocytes: 0 %
Lymphocytes Relative: 16 %
Lymphs Abs: 1 10*3/uL (ref 0.7–4.0)
MCH: 30.3 pg (ref 26.0–34.0)
MCHC: 30.5 g/dL (ref 30.0–36.0)
MCV: 99.3 fL (ref 80.0–100.0)
Monocytes Absolute: 0.7 10*3/uL (ref 0.1–1.0)
Monocytes Relative: 12 %
Neutro Abs: 4.5 10*3/uL (ref 1.7–7.7)
Neutrophils Relative %: 72 %
Platelet Count: 263 10*3/uL (ref 150–400)
RBC: 2.77 MIL/uL — ABNORMAL LOW (ref 3.87–5.11)
RDW: 14 % (ref 11.5–15.5)
WBC Count: 6.2 10*3/uL (ref 4.0–10.5)
nRBC: 0 % (ref 0.0–0.2)

## 2022-07-20 LAB — CMP (CANCER CENTER ONLY)
ALT: 7 U/L (ref 0–44)
AST: 13 U/L — ABNORMAL LOW (ref 15–41)
Albumin: 3.2 g/dL — ABNORMAL LOW (ref 3.5–5.0)
Alkaline Phosphatase: 69 U/L (ref 38–126)
Anion gap: 8 (ref 5–15)
BUN: 17 mg/dL (ref 8–23)
CO2: 28 mmol/L (ref 22–32)
Calcium: 8.9 mg/dL (ref 8.9–10.3)
Chloride: 105 mmol/L (ref 98–111)
Creatinine: 0.69 mg/dL (ref 0.44–1.00)
GFR, Estimated: >60 mL/min (ref >60)
Glucose, Bld: 120 mg/dL — ABNORMAL HIGH (ref 70–99)
Potassium: 3.8 mmol/L (ref 3.5–5.1)
Sodium: 141 mmol/L (ref 135–145)
Total Bilirubin: 0.2 mg/dL — ABNORMAL LOW (ref 0.3–1.2)
Total Protein: 7.4 g/dL (ref 6.5–8.1)

## 2022-07-20 MED ORDER — HEPARIN SOD (PORK) LOCK FLUSH 100 UNIT/ML IV SOLN
500.0000 [IU] | Freq: Once | INTRAVENOUS | Status: AC | PRN
  Administered 2022-07-20: 500 [IU]

## 2022-07-20 MED ORDER — SODIUM CHLORIDE 0.9 % IV SOLN: Freq: Once | INTRAVENOUS | Status: AC

## 2022-07-20 MED ORDER — SODIUM CHLORIDE 0.9% FLUSH
10.0000 mL | INTRAVENOUS | Status: DC | PRN
  Administered 2022-07-20: 10 mL

## 2022-07-20 MED ORDER — DEXTROSE 5 % IV SOLN
34.0000 mg/m2 | Freq: Once | INTRAVENOUS | Status: AC
  Administered 2022-07-20: 60 mg via INTRAVENOUS
  Filled 2022-07-20: qty 30

## 2022-07-20 MED ORDER — SODIUM CHLORIDE 0.9% FLUSH
10.0000 mL | Freq: Once | INTRAVENOUS | Status: AC
  Administered 2022-07-20: 10 mL

## 2022-07-20 NOTE — Telephone Encounter (Signed)
Oral Chemotherapy Pharmacist Encounter  I spoke with patient for overview of: Cytoxan (cyclophosphamide) for the  treatment of relapsed multiple myeloma with carfilzomib and dexamethasone, planned duration until disease progression or unacceptable drug toxicity.  Counseled patient on administration, dosing, side effects, monitoring, drug-food interactions, safe handling, storage, and disposal.  Patient will take cytoxan '50mg'$  capsules, Take 5 capsules ('250mg'$ ) by mouth once a week. Patient will take with food to minimize GI upset and will take in the morning with plenty of hydration.   Cytoxan start date: 07/15/2022  Adverse effects include but are not limited to: decreased blood counts, GI upset, hepatotoxicity, fatigue, nausea, vomiting, and decreased appetitie/ weight loss.  Patient understands the importance of drinking plenty of water throughout the day when she is taking this medication.  Reviewed with patient importance of keeping a medication schedule and plan for any missed doses. No barriers to medication adherence identified.  Medication reconciliation performed and medication/allergy list updated.  This was shipped from [place].   Patient informed the pharmacy will reach out 5-7 days prior to needing next fill of Xeloda to coordinate continued medication acquisition to prevent break in therapy.  All questions answered.  Patient voiced understanding and appreciation.   Medication education handout placed in mail for patient. Patient knows to call the office with questions or concerns. Oral Chemotherapy Clinic phone number provided to patient.   Drema Halon, PharmD Hematology/Oncology Clinical Pharmacist [facility] [phone]

## 2022-07-20 NOTE — Progress Notes (Signed)
Pt takes home pre-meds (dex and zofran).  Took in infusion today at 0900.

## 2022-08-03 ENCOUNTER — Inpatient Hospital Stay: Payer: Medicare HMO | Attending: Hematology and Oncology | Admitting: Hematology and Oncology

## 2022-08-03 ENCOUNTER — Inpatient Hospital Stay: Payer: Medicare HMO

## 2022-08-03 ENCOUNTER — Encounter: Payer: Self-pay | Admitting: Hematology and Oncology

## 2022-08-03 VITALS — BP 140/53 | HR 72 | Temp 99.1°F | Resp 18 | Ht 62.0 in | Wt 155.4 lb

## 2022-08-03 DIAGNOSIS — C9002 Multiple myeloma in relapse: Secondary | ICD-10-CM | POA: Insufficient documentation

## 2022-08-03 DIAGNOSIS — Z9484 Stem cells transplant status: Secondary | ICD-10-CM | POA: Insufficient documentation

## 2022-08-03 DIAGNOSIS — D6481 Anemia due to antineoplastic chemotherapy: Secondary | ICD-10-CM | POA: Diagnosis not present

## 2022-08-03 DIAGNOSIS — T451X5A Adverse effect of antineoplastic and immunosuppressive drugs, initial encounter: Secondary | ICD-10-CM | POA: Diagnosis not present

## 2022-08-03 DIAGNOSIS — D469 Myelodysplastic syndrome, unspecified: Secondary | ICD-10-CM

## 2022-08-03 DIAGNOSIS — Z5111 Encounter for antineoplastic chemotherapy: Secondary | ICD-10-CM | POA: Diagnosis present

## 2022-08-03 LAB — CMP (CANCER CENTER ONLY)
ALT: 6 U/L (ref 0–44)
AST: 11 U/L — ABNORMAL LOW (ref 15–41)
Albumin: 3.4 g/dL — ABNORMAL LOW (ref 3.5–5.0)
Alkaline Phosphatase: 75 U/L (ref 38–126)
Anion gap: 9 (ref 5–15)
BUN: 14 mg/dL (ref 8–23)
CO2: 27 mmol/L (ref 22–32)
Calcium: 9.1 mg/dL (ref 8.9–10.3)
Chloride: 102 mmol/L (ref 98–111)
Creatinine: 0.66 mg/dL (ref 0.44–1.00)
GFR, Estimated: >60 mL/min (ref >60)
Glucose, Bld: 102 mg/dL — ABNORMAL HIGH (ref 70–99)
Potassium: 3.8 mmol/L (ref 3.5–5.1)
Sodium: 138 mmol/L (ref 135–145)
Total Bilirubin: 0.2 mg/dL — ABNORMAL LOW (ref 0.3–1.2)
Total Protein: 7.2 g/dL (ref 6.5–8.1)

## 2022-08-03 LAB — CBC WITH DIFFERENTIAL (CANCER CENTER ONLY)
Abs Immature Granulocytes: 0.01 10*3/uL (ref 0.00–0.07)
Basophils Absolute: 0 10*3/uL (ref 0.0–0.1)
Basophils Relative: 0 %
Eosinophils Absolute: 0 10*3/uL (ref 0.0–0.5)
Eosinophils Relative: 0 %
HCT: 27.5 % — ABNORMAL LOW (ref 36.0–46.0)
Hemoglobin: 8.6 g/dL — ABNORMAL LOW (ref 12.0–15.0)
Immature Granulocytes: 0 %
Lymphocytes Relative: 15 %
Lymphs Abs: 0.9 10*3/uL (ref 0.7–4.0)
MCH: 30.7 pg (ref 26.0–34.0)
MCHC: 31.3 g/dL (ref 30.0–36.0)
MCV: 98.2 fL (ref 80.0–100.0)
Monocytes Absolute: 0.8 10*3/uL (ref 0.1–1.0)
Monocytes Relative: 13 %
Neutro Abs: 4.3 10*3/uL (ref 1.7–7.7)
Neutrophils Relative %: 72 %
Platelet Count: 377 10*3/uL (ref 150–400)
RBC: 2.8 MIL/uL — ABNORMAL LOW (ref 3.87–5.11)
RDW: 15 % (ref 11.5–15.5)
WBC Count: 5.9 10*3/uL (ref 4.0–10.5)
nRBC: 0 % (ref 0.0–0.2)

## 2022-08-03 MED ORDER — SODIUM CHLORIDE 0.9% FLUSH
10.0000 mL | Freq: Once | INTRAVENOUS | Status: AC
  Administered 2022-08-03: 10 mL

## 2022-08-03 MED ORDER — SODIUM CHLORIDE 0.9 % IV SOLN: Freq: Once | INTRAVENOUS | Status: AC

## 2022-08-03 MED ORDER — DEXTROSE 5 % IV SOLN
34.0000 mg/m2 | Freq: Once | INTRAVENOUS | Status: AC
  Administered 2022-08-03: 60 mg via INTRAVENOUS
  Filled 2022-08-03: qty 30

## 2022-08-03 NOTE — Assessment & Plan Note (Signed)
She has history of anemia and now has anemia secondary to recent surgery She is not symptomatic She does not need transfusion support Monitor closely

## 2022-08-03 NOTE — Assessment & Plan Note (Signed)
Overall, with the addition of Cytoxan, she is becoming more anemic She is able to tolerated We will repeat myeloma panel again next week At this point in time, I do not believe we can increase the dose of her treatment further She will continue calcium and vitamin D supplement She will continue aspirin for DVT prophylaxis I will see her again at the end of the month for further follow-up

## 2022-08-03 NOTE — Progress Notes (Signed)
[place] OFFICE PROGRESS NOTE  Patient Care Team: Default, Provider, MD as PCP - General Fletcher Anon Mertie Clause, MD as PCP - Cardiology (Cardiology) [name], MD as Referring Physician (Hematology and Oncology) [name], MD as Consulting Physician (Internal Medicine) [name], [name], MD as Consulting Physician (Infectious Diseases) [name], MD as Consulting Physician (Hematology and Oncology) [name], NP as Nurse Practitioner (Hematology and Oncology) [name], MD as Consulting Physician (Hematology and Oncology)  ASSESSMENT & PLAN:  Multiple myeloma in relapse ([place]) Overall, with the addition of Cytoxan, she is becoming more anemic She is able to tolerated We will repeat myeloma panel again next week At this point in time, I do not believe we can increase the dose of her treatment further She will continue calcium and vitamin D supplement She will continue aspirin for DVT prophylaxis I will see her again at the end of the month for further follow-up  Anemia due to antineoplastic chemotherapy She has history of anemia and now has anemia secondary to recent surgery She is not symptomatic She does not need transfusion support Monitor closely  Orders Placed This Encounter  Procedures   CBC with Differential ([facility] Only)    Standing Status:   Future    Standing Expiration Date:   09/02/2023   CMP ([facility] only)    Standing Status:   Future    Standing Expiration Date:   09/02/2023   CBC with Differential ([facility] Only)    Standing Status:   Future    Standing Expiration Date:   09/09/2023   CMP ([facility] only)    Standing Status:   Future    Standing Expiration Date:   09/09/2023   CBC with Differential ([facility] Only)    Standing Status:   Future    Standing Expiration Date:   09/16/2023   CMP ([facility] only)    Standing Status:   Future    Standing Expiration Date:    09/16/2023    All questions were answered. The patient knows to call the clinic with any problems, questions or concerns. The total time spent in the appointment was 25 minutes encounter with patients including review of chart and various tests results, discussions about plan of care and coordination of care plan   [name], MD 08/03/2022 10:31 AM  INTERVAL HISTORY: Please see below for problem oriented charting. she returns for treatment follow-up with her caregiver She tolerated recent treatment well She denies excessive symptoms from anemia Her mobility has improved She denies nausea  REVIEW OF SYSTEMS:   Constitutional: Denies fevers, chills or abnormal weight loss Eyes: Denies blurriness of vision Ears, nose, mouth, throat, and face: Denies mucositis or sore throat Respiratory: Denies cough, dyspnea or wheezes Cardiovascular: Denies palpitation, chest discomfort or lower extremity swelling Gastrointestinal:  Denies nausea, heartburn or change in bowel habits Skin: Denies abnormal skin rashes Lymphatics: Denies new lymphadenopathy or easy bruising Neurological:Denies numbness, tingling or new weaknesses Behavioral/Psych: Mood is stable, no new changes  All other systems were reviewed with the patient and are negative.  I have reviewed the past medical history, past surgical history, social history and family history with the patient and they are unchanged from previous note.  ALLERGIES:  has No Known Allergies.  MEDICATIONS:  Current Outpatient Medications  Medication Sig Dispense Refill   acyclovir (ZOVIRAX) 400 MG tablet Take 1 tablet (400 mg total) by mouth 2 (two) times daily. [place]  tablet 0   aspirin 325 MG tablet Take 325 mg by mouth daily.     calcium carbonate (TUMS - DOSED IN MG ELEMENTAL CALCIUM) 500 MG chewable tablet Chew 1 tablet by mouth daily.     Carfilzomib (KYPROLIS IV) Inject into the vein once a week.     Cholecalciferol 25 MCG (1000 UT) tablet Take 1,000  Units by mouth daily.     Cyanocobalamin (VITAMIN B12 PO) Take 1 tablet by mouth daily.     cyclophosphamide (CYTOXAN) 50 MG capsule Take 5 capsules (250 mg total) by mouth once a week. Take with food to minimize GI upset. Take early in the day and maintain hydration. 20 capsule 11   dexamethasone (DECADRON) 4 MG tablet Take 1 tablet (4 mg total) by mouth once a week. Take 1 tablet by mouth 1 hour before chemotherapy 20 tablet 5   loperamide (IMODIUM) 2 MG capsule Take by mouth as needed for diarrhea or loose stools. As needed only     metoprolol tartrate (LOPRESSOR) 25 MG tablet TAKE 1 AND 1/2 TABLETS BY MOUTH TWICE DAILY 90 tablet 0   mirtazapine (REMERON) 7.5 MG tablet Take 1 tablet (7.5 mg total) by mouth at bedtime. 90 tablet 0   Multiple Vitamin (MULTIVITAMIN WITH MINERALS) TABS tablet Take 1 tablet by mouth daily.     Multiple Vitamins-Minerals (PRESERVISION AREDS 2 PO) Take 1 tablet by mouth 2 (two) times daily.     ondansetron (ZOFRAN) 8 MG tablet Take 1 tablet (8 mg total) by mouth every 8 (eight) hours as needed (Nausea or vomiting). 30 tablet 1   pantoprazole (PROTONIX) 40 MG tablet TAKE 1 TABLET BY MOUTH 2 TIMES DAILY. TAKE 30-60 MINUTES BEFORE BREAKFAST AND DINNER. 180 tablet 2   No current facility-administered medications for this visit.   Facility-Administered Medications Ordered in Other Visits  Medication Dose Route Frequency Provider Last Rate Last Admin   carfilzomib (KYPROLIS) 60 mg in dextrose 5 % 100 mL chemo infusion  34 mg/m2 (Treatment Plan Recorded) Intravenous Once [name], MD        SUMMARY OF ONCOLOGIC HISTORY: Oncology History Overview Note  Multiple myeloma, Ig A Lambda, M spike 3.54 grams, Calcium 9.2, Creatinine 0.8, Beta 2 microglobulin 4.52, IgA 4840 mg/dL, lambda light chain 75.4, albumin 3.6, hemoglobin 9.7, platelet 115    Primary site: Multiple Myeloma   Staging method: AJCC 6th Edition   Clinical: Stage IIA signed by [name], MD on 11/07/2013   2:46 PM   Summary: Stage IIA S/p Allo transplant at [facility] on revlimid, pomalyst, daratumumab and velcade     Multiple myeloma in relapse ([place])  10/31/2013 Bone Marrow Biopsy   Bone marrow biopsy confirmed multiple myeloma with 40% bone marrow involvement. Skeletal survey showed minimal lesions in her score with generalized demineralization   11/10/2013 - 02/13/2014 Chemotherapy   The patient is started on induction chemotherapy with weekly dexamethasone 40 mg by mouth as well as Velcade subcutaneous injection on days 1, 4, 8 and 11. On 11/21/2013, she was started on monthly Zometa.   12/23/2013 Adverse Reaction   The dose of Velcade was reduced due to thrombocytopenia.   01/28/2014 - 04/07/2014 Chemotherapy   Revlimid is added. Treatment was discontinued due to lack of response.   02/24/2014 - 04/07/2014 Chemotherapy   Due to worsening peripheral neuropathy, Velcade injection is changed to once a week. Revlimid was given 21 days on, 7 days off.   04/07/2014 - 04/10/2014 Chemotherapy   Revlimid  was discontinued due to lack of response. Chemotherapy was changed back to Velcade injection twice a week, 2 weeks on 1 week off. Her treatment was switched to to minimum response   04/20/2014 - 06/02/2014 Chemotherapy   chemotherapy is switched to Carfilzomib, Cytoxan and dexamethasone.   04/22/2014 Procedure   she has placement of port for chemotherapy.   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 at [facility]   10/20/2014 - 03/24/2015 Chemotherapy    she received chemotherapy with Kyprolis, Revlimid and  dexamethasone   10/22/2014 Procedure   She has port placement   01/19/2015 Tumor Marker   IgA lambda M spike at 0.4 g    01/20/2015 Miscellaneous   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 No cource was found and fever resolved with IV vancomycin and meropenem   09/13/2015 Bone Marrow Biopsy   Bone marrow biopsy showed no increased blasts, 3-4 % plasma cells   03/02/2016 Bone Marrow Biopsy   Bone marrow biopsy at [facility] showed normocellular (30%) bone marrow with trilineage hematopoiesis. No significant increase in blasts. No significant increase in plasma cells.   05/12/2016 Imaging   DEXA scan at [facility] showed osteopenia   10/24/2016 Imaging   Skeletal survey at [facility], no new lesions   12/07/2017 Imaging   No focal abnormality noted to suggest myeloma. Exam is stable from prior exam.   03/01/2018 Procedure   Successful 8 French right internal jugular vein power port placement with its tip at the SVC/RA junction.   03/06/2018 -  Chemotherapy   The patient had daratumumab    07/09/2018 Bone Marrow Biopsy   Bone marrow biopsy at [facility] showed residual disease at 0.004% plasma cells   07/03/2019 Bone Marrow Biopsy   A. Bone marrow, flow cytometric analysis for multiple myeloma minimal residual disease detection:   Negative. No phenotypically abnormal plasma cells at or above the limit of detection identified.     No monotypic B-cell population identified. Negative for increased blasts.   03/25/2021 Bone Marrow Biopsy   Repeat bone marrow biopsy at  [facility] showed female donor karyotype.  5 to 7% plasma cell is seen.  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   04/20/2021 Echocardiogram   1. Left ventricular ejection fraction, by estimation, is 60 to 65%. The left ventricle has normal function. The left ventricle has no regional wall motion abnormalities. Left ventricular diastolic parameters were normal.  2. Right ventricular systolic function is normal. The right ventricular size is normal.  3. The mitral valve is normal in structure. No evidence of mitral valve regurgitation. No evidence of mitral stenosis.  4. The aortic valve is normal in structure. Aortic valve regurgitation is not visualized. No aortic stenosis is present.  5. The inferior vena cava is normal in size with greater than 50% respiratory variability, suggesting right atrial pressure of 3 mmHg.   04/29/2021 - 02/17/2022 Chemotherapy   Patient is on Treatment Plan : MYELOMA RELAPSED/ REFRACTORY Carfilzomib D1,8,15 (20/27) + Dexamethasone (KPd) q28d     04/30/2021 -  Chemotherapy   Patient is on Treatment Plan : MYELOMA RELAPSED/REFRACTORY Carfilzomib (20/70) D1,8,15 + Dexamethasone weekly (40) (Kd) q28d  x 9 cycles / Dexamethasone D1,8,15     05/04/2021 PET scan   1. No evidence active multiple myeloma within the skeleton on FDG PET scan. 2. No suspicious lytic lesion on CT portion exam. 3. No plasmacytoma.   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   10/31/2015 Miscellaneous   She has engrafted     PHYSICAL EXAMINATION: ECOG PERFORMANCE STATUS: 1 - Symptomatic but completely ambulatory  Vitals:   08/03/22 0931  BP: (!) 140/53  Pulse: 72  Resp: 18  Temp: 99.1 F (37.3 C)  SpO2: 100%   Filed Weights   08/03/22 0931  Weight: 155 lb 6.4 oz (70.5 kg)    GENERAL:alert, no distress and comfortable  NEURO: alert & oriented x 3 with fluent speech, no focal motor/sensory deficits  LABORATORY DATA:  I have reviewed the data as listed    Component Value Date/Time   NA 138 08/03/2022 0915   NA 142 01/04/2017 0902   K 3.8 08/03/2022 0915   K 3.9 01/04/2017 0902   CL 102 08/03/2022 0915   CO2 27 08/03/2022 0915   CO2 28 01/04/2017 0902   GLUCOSE 102 (H) 08/03/2022 0915   GLUCOSE 92 01/04/2017 0902   BUN 14 08/03/2022 0915   BUN 9.5 01/04/2017 0902   CREATININE 0.66 08/03/2022 0915   CREATININE 0.7 01/04/2017 0902   CALCIUM 9.1 08/03/2022 0915   CALCIUM 9.0 01/04/2017 0902   PROT 7.2 08/03/2022 0915   PROT 6.0 (L) 01/04/2017 0902   PROT 5.8 (L) 01/04/2017 0902   ALBUMIN 3.4 (L) 08/03/2022 0915   ALBUMIN 3.4 (L) 01/04/2017 0902   AST 11 (L) 08/03/2022 0915   AST 21 01/04/2017 0902   ALT 6 08/03/2022 0915   ALT 16 01/04/2017 0902   ALKPHOS 75 08/03/2022 0915   ALKPHOS 101 01/04/2017 0902   BILITOT 0.2 (L) 08/03/2022 0915   BILITOT 0.56 01/04/2017 0902   GFRNONAA >60 08/03/2022 0915   GFRAA >60 03/25/2020 0917   GFRAA >60 12/18/2018 0901    No results found for: "SPEP", "UPEP"  Lab Results  Component Value Date   WBC 5.9 08/03/2022   NEUTROABS 4.3 08/03/2022   HGB 8.6 (L) 08/03/2022   HCT 27.5 (L) 08/03/2022   MCV 98.2 08/03/2022   PLT 377 08/03/2022      Chemistry  Component Value Date/Time   NA 138 08/03/2022 0915   NA 142 01/04/2017 0902   K 3.8 08/03/2022 0915   K 3.9 01/04/2017 0902   CL 102 08/03/2022 0915   CO2 27 08/03/2022 0915   CO2 28 01/04/2017 0902   BUN 14 08/03/2022 0915   BUN 9.5 01/04/2017 0902   CREATININE 0.66 08/03/2022 0915   CREATININE 0.7 01/04/2017 0902      Component Value Date/Time   CALCIUM 9.1 08/03/2022 0915   CALCIUM 9.0 01/04/2017 0902   ALKPHOS 75 08/03/2022 0915   ALKPHOS 101 01/04/2017 0902   AST 11 (L) 08/03/2022 0915   AST 21 01/04/2017 0902   ALT 6 08/03/2022 0915   ALT 16 01/04/2017 0902   BILITOT 0.2 (L) 08/03/2022 0915   BILITOT 0.56 01/04/2017 0902

## 2022-08-03 NOTE — Patient Instructions (Signed)
[facility]  Discharge Instructions: Thank you for choosing [facility] to provide your oncology and hematology care.   If you have a lab appointment with the [facility], please go directly to the [facility] and check in at the registration area.   Wear comfortable clothing and clothing appropriate for easy access to any Portacath or PICC line.   We strive to give you quality time with your provider. You may need to reschedule your appointment if you arrive late (15 or more minutes).  Arriving late affects you and other patients whose appointments are after yours.  Also, if you miss three or more appointments without notifying the office, you may be dismissed from the clinic at the provider's discretion.      For prescription refill requests, have your pharmacy contact our office and allow 72 hours for refills to be completed.    Today you received the following chemotherapy and/or immunotherapy agent: Carfilzomib (Kyprolis)   To help prevent nausea and vomiting after your treatment, we encourage you to take your nausea medication as directed.  BELOW ARE SYMPTOMS THAT SHOULD BE REPORTED IMMEDIATELY: *FEVER GREATER THAN 100.4 F (38 C) OR HIGHER *CHILLS OR SWEATING *NAUSEA AND VOMITING THAT IS NOT CONTROLLED WITH YOUR NAUSEA MEDICATION *UNUSUAL SHORTNESS OF BREATH *UNUSUAL BRUISING OR BLEEDING *URINARY PROBLEMS (pain or burning when urinating, or frequent urination) *BOWEL PROBLEMS (unusual diarrhea, constipation, pain near the anus) TENDERNESS IN MOUTH AND THROAT WITH OR WITHOUT PRESENCE OF ULCERS (sore throat, sores in mouth, or a toothache) UNUSUAL RASH, SWELLING OR PAIN  UNUSUAL VAGINAL DISCHARGE OR ITCHING   Items with * indicate a potential emergency and should be followed up as soon as possible or go to the Emergency Department if any problems should occur.  Please show the CHEMOTHERAPY ALERT CARD or IMMUNOTHERAPY ALERT CARD  at check-in to the Emergency Department and triage nurse.  Should you have questions after your visit or need to cancel or reschedule your appointment, please contact [facility]  Dept: [phone]  and follow the prompts.  Office hours are 8:00 a.m. to 4:30 p.m. Monday - Friday. Please note that voicemails left after 4:00 p.m. may not be returned until the following business day.  We are closed weekends and major holidays. You have access to a nurse at all times for urgent questions. Please call the main number to the clinic Dept: [phone] and follow the prompts.   For any non-urgent questions, you may also contact your provider using MyChart. We now offer e-Visits for anyone 15 and older to request care online for non-urgent symptoms. For details visit [email or web address].   Also download the MyChart app! Go to the app store, search "MyChart", open the app, select [facility], and log in with your MyChart username and password. Carfilzomib Injection What is this medication? CARFILZOMIB (kar FILZ oh mib) treats multiple myeloma, a type of bone marrow cancer. It works by blocking a protein that causes cancer cells to grow and multiply. This helps to slow or stop the spread of cancer cells. This medicine may be used for other purposes; ask your health care provider or pharmacist if you have questions. COMMON BRAND NAME(S): KYPROLIS What should I tell my care team before I take this medication? They need to know if you have any of these conditions: Heart disease History of blood clots Irregular heartbeat Kidney disease Liver disease Lung or breathing disease An unusual  or allergic reaction to carfilzomib, or other medications, foods, dyes, or preservatives If you or your partner are pregnant or trying to get pregnant Breastfeeding How should I use this medication? This medication is injected into a vein. It is given by your care team in a hospital  or clinic setting. Talk to your care team about the use of this medication in children. Special care may be needed. Overdosage: If you think you have taken too much of this medicine contact a poison control center or emergency room at once. NOTE: This medicine is only for you. Do not share this medicine with others. What if I miss a dose? Keep appointments for follow-up doses. It is important not to miss your dose. Call your care team if you are unable to keep an appointment. What may interact with this medication? Interactions are not expected. This list may not describe all possible interactions. Give your health care provider a list of all the medicines, herbs, non-prescription drugs, or dietary supplements you use. Also tell them if you smoke, drink alcohol, or use illegal drugs. Some items may interact with your medicine. What should I watch for while using this medication? Your condition will be monitored carefully while you are receiving this medication. You may need blood work while taking this medication. Check with your care team if you have severe diarrhea, nausea, and vomiting, or if you sweat a lot. The loss of too much body fluid may make it dangerous for you to take this medication. This medication may affect your coordination, reaction time, or judgment. Do not drive or operate machinery until you know how this medication affects you. Sit up or stand slowly to reduce the risk of dizzy or fainting spells. Drinking alcohol with this medication can increase the risk of these side effects. Talk to your care team if you may be pregnant. Serious birth defects can occur if you take this medication during pregnancy and for 6 months after the last dose. You will need a negative pregnancy test before starting this medication. Contraception is recommended while taking this medication and for 6 months after the last dose. Your care team can help you find an option that works for you. If your  partner can get pregnant, use a condom during sex while taking this medication and for 3 months after the last dose. Do not breastfeed while taking this medication and for 2 weeks after the last dose. This medication may cause infertility. Talk to your care team if you are concerned about your fertility. What side effects may I notice from receiving this medication? Side effects that you should report to your care team as soon as possible: Allergic reactions--skin rash, itching, hives, swelling of the face, lips, tongue, or throat Bleeding--bloody or black, tar-like stools, vomiting blood or brown material that looks like coffee grounds, red or dark brown urine, small red or purple spots on skin, unusual bruising or bleeding Blood clot--pain, swelling, or warmth in the leg, shortness of breath, chest pain Dizziness, loss of balance or coordination, confusion or trouble speaking Heart attack--pain or tightness in the chest, shoulders, arms, or jaw, nausea, shortness of breath, cold or clammy skin, feeling faint or lightheaded Heart failure--shortness of breath, swelling of the ankles, feet, or hands, sudden weight gain, unusual weakness or fatigue Heart rhythm changes--fast or irregular heartbeat, dizziness, feeling faint or lightheaded, chest pain, trouble breathing Increase in blood pressure Infection--fever, chills, cough, sore throat, wounds that don't heal, pain or trouble when passing  urine, general feeling of discomfort or being unwell Infusion reactions--chest pain, shortness of breath or trouble breathing, feeling faint or lightheaded Kidney injury--decrease in the amount of urine, swelling of the ankles, hands, or feet Liver injury--right upper belly pain, loss of appetite, nausea, light-colored stool, dark yellow or brown urine, yellowing skin or eyes, unusual weakness or fatigue Lung injury--shortness of breath or trouble breathing, cough, spitting up blood, chest pain, fever Pulmonary  hypertension--shortness of breath, chest pain, fast or irregular heartbeat, feeling faint or lightheaded, fatigue, swelling of the ankles or feet Stomach pain, bloody diarrhea, pale skin, unusual weakness or fatigue, decrease in the amount of urine, which may be signs of hemolytic uremic syndrome Sudden and severe headache, confusion, change in vision, seizures, which may be signs of posterior reversible encephalopathy syndrome (PRES) TTP--purple spots on the skin or inside the mouth, pale skin, yellowing skin or eyes, unusual weakness or fatigue, fever, fast or irregular heartbeat, confusion, change in vision, trouble speaking, trouble walking Tumor lysis syndrome (TLS)--nausea, vomiting, diarrhea, decrease in the amount of urine, dark urine, unusual weakness or fatigue, confusion, muscle pain or cramps, fast or irregular heartbeat, joint pain Side effects that usually do not require medical attention (report to your care team if they continue or are bothersome): Diarrhea Fatigue Nausea Trouble sleeping This list may not describe all possible side effects. Call your doctor for medical advice about side effects. You may report side effects to FDA at 1-800-FDA-1088. Where should I keep my medication? This medication is given in a hospital or clinic. It will not be stored at home. NOTE: This sheet is a summary. It may not cover all possible information. If you have questions about this medicine, talk to your doctor, pharmacist, or health care provider.  2023 Elsevier/Gold Standard (2021-11-09 00:00:00)

## 2022-08-04 ENCOUNTER — Other Ambulatory Visit: Payer: Self-pay

## 2022-08-10 ENCOUNTER — Inpatient Hospital Stay: Payer: Medicare HMO

## 2022-08-10 VITALS — BP 148/71 | HR 80 | Temp 98.7°F | Resp 18 | Wt 154.0 lb

## 2022-08-10 DIAGNOSIS — C9002 Multiple myeloma in relapse: Secondary | ICD-10-CM

## 2022-08-10 DIAGNOSIS — Z5111 Encounter for antineoplastic chemotherapy: Secondary | ICD-10-CM | POA: Diagnosis not present

## 2022-08-10 DIAGNOSIS — D469 Myelodysplastic syndrome, unspecified: Secondary | ICD-10-CM

## 2022-08-10 LAB — CBC WITH DIFFERENTIAL (CANCER CENTER ONLY)
Abs Immature Granulocytes: 0.03 10*3/uL (ref 0.00–0.07)
Basophils Absolute: 0 10*3/uL (ref 0.0–0.1)
Basophils Relative: 0 %
Eosinophils Absolute: 0 10*3/uL (ref 0.0–0.5)
Eosinophils Relative: 0 %
HCT: 27.2 % — ABNORMAL LOW (ref 36.0–46.0)
Hemoglobin: 8.5 g/dL — ABNORMAL LOW (ref 12.0–15.0)
Immature Granulocytes: 1 %
Lymphocytes Relative: 13 %
Lymphs Abs: 0.8 10*3/uL (ref 0.7–4.0)
MCH: 30.6 pg (ref 26.0–34.0)
MCHC: 31.3 g/dL (ref 30.0–36.0)
MCV: 97.8 fL (ref 80.0–100.0)
Monocytes Absolute: 0.7 10*3/uL (ref 0.1–1.0)
Monocytes Relative: 12 %
Neutro Abs: 4.6 10*3/uL (ref 1.7–7.7)
Neutrophils Relative %: 74 %
Platelet Count: 271 10*3/uL (ref 150–400)
RBC: 2.78 MIL/uL — ABNORMAL LOW (ref 3.87–5.11)
RDW: 15.6 % — ABNORMAL HIGH (ref 11.5–15.5)
WBC Count: 6.2 10*3/uL (ref 4.0–10.5)
nRBC: 0 % (ref 0.0–0.2)

## 2022-08-10 LAB — CMP (CANCER CENTER ONLY)
ALT: 10 U/L (ref 0–44)
AST: 16 U/L (ref 15–41)
Albumin: 2.9 g/dL — ABNORMAL LOW (ref 3.5–5.0)
Alkaline Phosphatase: 67 U/L (ref 38–126)
Anion gap: 13 (ref 5–15)
BUN: 13 mg/dL (ref 8–23)
CO2: 25 mmol/L (ref 22–32)
Calcium: 8.8 mg/dL — ABNORMAL LOW (ref 8.9–10.3)
Chloride: 101 mmol/L (ref 98–111)
Creatinine: 0.75 mg/dL (ref 0.44–1.00)
GFR, Estimated: >60 mL/min (ref >60)
Glucose, Bld: 106 mg/dL — ABNORMAL HIGH (ref 70–99)
Potassium: 4.1 mmol/L (ref 3.5–5.1)
Sodium: 139 mmol/L (ref 135–145)
Total Bilirubin: 0.4 mg/dL (ref 0.3–1.2)
Total Protein: 7.7 g/dL (ref 6.5–8.1)

## 2022-08-10 MED ORDER — HEPARIN SOD (PORK) LOCK FLUSH 100 UNIT/ML IV SOLN
500.0000 [IU] | Freq: Once | INTRAVENOUS | Status: AC | PRN
  Administered 2022-08-10: 500 [IU]

## 2022-08-10 MED ORDER — DEXTROSE 5 % IV SOLN
34.0000 mg/m2 | Freq: Once | INTRAVENOUS | Status: AC
  Administered 2022-08-10: 60 mg via INTRAVENOUS
  Filled 2022-08-10: qty 30

## 2022-08-10 MED ORDER — SODIUM CHLORIDE 0.9% FLUSH
10.0000 mL | Freq: Once | INTRAVENOUS | Status: AC
  Administered 2022-08-10: 10 mL

## 2022-08-10 MED ORDER — SODIUM CHLORIDE 0.9% FLUSH
10.0000 mL | INTRAVENOUS | Status: DC | PRN
  Administered 2022-08-10: 10 mL

## 2022-08-10 MED ORDER — SODIUM CHLORIDE 0.9 % IV SOLN: Freq: Once | INTRAVENOUS | Status: AC

## 2022-08-10 NOTE — Patient Instructions (Signed)
[facility]  Discharge Instructions: Thank you for choosing [facility] to provide your oncology and hematology care.   If you have a lab appointment with the [facility], please go directly to the [facility] and check in at the registration area.   Wear comfortable clothing and clothing appropriate for easy access to any Portacath or PICC line.   We strive to give you quality time with your provider. You may need to reschedule your appointment if you arrive late (15 or more minutes).  Arriving late affects you and other patients whose appointments are after yours.  Also, if you miss three or more appointments without notifying the office, you may be dismissed from the clinic at the provider's discretion.      For prescription refill requests, have your pharmacy contact our office and allow 72 hours for refills to be completed.    Today you received the following chemotherapy and/or immunotherapy agents: Kyprolis      To help prevent nausea and vomiting after your treatment, we encourage you to take your nausea medication as directed.  BELOW ARE SYMPTOMS THAT SHOULD BE REPORTED IMMEDIATELY: *FEVER GREATER THAN 100.4 F (38 C) OR HIGHER *CHILLS OR SWEATING *NAUSEA AND VOMITING THAT IS NOT CONTROLLED WITH YOUR NAUSEA MEDICATION *UNUSUAL SHORTNESS OF BREATH *UNUSUAL BRUISING OR BLEEDING *URINARY PROBLEMS (pain or burning when urinating, or frequent urination) *BOWEL PROBLEMS (unusual diarrhea, constipation, pain near the anus) TENDERNESS IN MOUTH AND THROAT WITH OR WITHOUT PRESENCE OF ULCERS (sore throat, sores in mouth, or a toothache) UNUSUAL RASH, SWELLING OR PAIN  UNUSUAL VAGINAL DISCHARGE OR ITCHING   Items with * indicate a potential emergency and should be followed up as soon as possible or go to the Emergency Department if any problems should occur.  Please show the CHEMOTHERAPY ALERT CARD or IMMUNOTHERAPY ALERT CARD at  check-in to the Emergency Department and triage nurse.  Should you have questions after your visit or need to cancel or reschedule your appointment, please contact [facility]  Dept: [phone]  and follow the prompts.  Office hours are 8:00 a.m. to 4:30 p.m. Monday - Friday. Please note that voicemails left after 4:00 p.m. may not be returned until the following business day.  We are closed weekends and major holidays. You have access to a nurse at all times for urgent questions. Please call the main number to the clinic Dept: [phone] and follow the prompts.   For any non-urgent questions, you may also contact your provider using MyChart. We now offer e-Visits for anyone 88 and older to request care online for non-urgent symptoms. For details visit [email or web address].   Also download the MyChart app! Go to the app store, search "MyChart", open the app, select [facility], and log in with your MyChart username and password.

## 2022-08-10 NOTE — Progress Notes (Signed)
Patient took her dexamethasone at 0950 this morning.

## 2022-08-11 LAB — KAPPA/LAMBDA LIGHT CHAINS
Kappa free light chain: 6.7 mg/L (ref 3.3–19.4)
Kappa, lambda light chain ratio: 0.02 — ABNORMAL LOW (ref 0.26–1.65)
Lambda free light chains: 335.6 mg/L — ABNORMAL HIGH (ref 5.7–26.3)

## 2022-08-13 ENCOUNTER — Other Ambulatory Visit: Payer: Self-pay

## 2022-08-14 ENCOUNTER — Telehealth: Payer: Self-pay

## 2022-08-14 NOTE — Telephone Encounter (Signed)
Called and given below below message. She verbalized understanding.

## 2022-08-14 NOTE — Telephone Encounter (Signed)
Returned her call. She took Cytoxan and pushed oral fluids for 3 days. She thinks that she may be retaining fluids. Denies swelling in legs. Her resting heart rate is 80's to high 90's today. BP at therapy today was ?/75. She is voiding okay. She has 3 tabs left of a 20 mg Lasix. She is asking if it is okay to take a lasix tab?

## 2022-08-14 NOTE — Telephone Encounter (Signed)
No lasix Stay hydrated

## 2022-08-16 ENCOUNTER — Telehealth: Payer: Self-pay

## 2022-08-16 LAB — MULTIPLE MYELOMA PANEL, SERUM
Albumin SerPl Elph-Mcnc: 3 g/dL (ref 2.9–4.4)
Albumin/Glob SerPl: 0.8 (ref 0.7–1.7)
Alpha 1: 0.3 g/dL (ref 0.0–0.4)
Alpha2 Glob SerPl Elph-Mcnc: 1.1 g/dL — ABNORMAL HIGH (ref 0.4–1.0)
B-Globulin SerPl Elph-Mcnc: 0.8 g/dL (ref 0.7–1.3)
Gamma Glob SerPl Elph-Mcnc: 1.6 g/dL (ref 0.4–1.8)
Globulin, Total: 3.8 g/dL (ref 2.2–3.9)
IgA: 1096 mg/dL — ABNORMAL HIGH (ref 64–422)
IgG (Immunoglobin G), Serum: 528 mg/dL — ABNORMAL LOW (ref 586–1602)
IgM (Immunoglobulin M), Srm: 16 mg/dL — ABNORMAL LOW (ref 26–217)
M Protein SerPl Elph-Mcnc: 1 g/dL — ABNORMAL HIGH
Total Protein ELP: 6.8 g/dL (ref 6.0–8.5)

## 2022-08-16 NOTE — Telephone Encounter (Signed)
Pt called with concern about why her Myeloma Panel has not resulted. Spoke with Verdis Frederickson in the lab who states [facility] is behind. Advised pt of this information. She knows to call with any further concerns.

## 2022-08-17 ENCOUNTER — Inpatient Hospital Stay: Payer: Medicare HMO

## 2022-08-17 ENCOUNTER — Encounter: Payer: Self-pay | Admitting: Hematology and Oncology

## 2022-08-17 ENCOUNTER — Inpatient Hospital Stay (HOSPITAL_BASED_OUTPATIENT_CLINIC_OR_DEPARTMENT_OTHER): Payer: Medicare HMO | Admitting: Hematology and Oncology

## 2022-08-17 VITALS — BP 145/62 | HR 73 | Temp 97.7°F | Resp 18 | Ht 62.0 in | Wt 153.0 lb

## 2022-08-17 VITALS — BP 129/58 | HR 75 | Temp 98.8°F | Resp 18

## 2022-08-17 DIAGNOSIS — D6481 Anemia due to antineoplastic chemotherapy: Secondary | ICD-10-CM | POA: Diagnosis not present

## 2022-08-17 DIAGNOSIS — Z5111 Encounter for antineoplastic chemotherapy: Secondary | ICD-10-CM | POA: Diagnosis not present

## 2022-08-17 DIAGNOSIS — C9002 Multiple myeloma in relapse: Secondary | ICD-10-CM

## 2022-08-17 DIAGNOSIS — D469 Myelodysplastic syndrome, unspecified: Secondary | ICD-10-CM

## 2022-08-17 DIAGNOSIS — T451X5A Adverse effect of antineoplastic and immunosuppressive drugs, initial encounter: Secondary | ICD-10-CM

## 2022-08-17 LAB — CBC WITH DIFFERENTIAL (CANCER CENTER ONLY)
Abs Immature Granulocytes: 0.02 10*3/uL (ref 0.00–0.07)
Basophils Absolute: 0 10*3/uL (ref 0.0–0.1)
Basophils Relative: 0 %
Eosinophils Absolute: 0 10*3/uL (ref 0.0–0.5)
Eosinophils Relative: 0 %
HCT: 26.6 % — ABNORMAL LOW (ref 36.0–46.0)
Hemoglobin: 8.3 g/dL — ABNORMAL LOW (ref 12.0–15.0)
Immature Granulocytes: 0 %
Lymphocytes Relative: 12 %
Lymphs Abs: 0.7 10*3/uL (ref 0.7–4.0)
MCH: 30.5 pg (ref 26.0–34.0)
MCHC: 31.2 g/dL (ref 30.0–36.0)
MCV: 97.8 fL (ref 80.0–100.0)
Monocytes Absolute: 0.6 10*3/uL (ref 0.1–1.0)
Monocytes Relative: 10 %
Neutro Abs: 4.9 10*3/uL (ref 1.7–7.7)
Neutrophils Relative %: 78 %
Platelet Count: 244 10*3/uL (ref 150–400)
RBC: 2.72 MIL/uL — ABNORMAL LOW (ref 3.87–5.11)
RDW: 16.2 % — ABNORMAL HIGH (ref 11.5–15.5)
WBC Count: 6.3 10*3/uL (ref 4.0–10.5)
nRBC: 0 % (ref 0.0–0.2)

## 2022-08-17 LAB — CMP (CANCER CENTER ONLY)
ALT: 8 U/L (ref 0–44)
AST: 10 U/L — ABNORMAL LOW (ref 15–41)
Albumin: 3.6 g/dL (ref 3.5–5.0)
Alkaline Phosphatase: 90 U/L (ref 38–126)
Anion gap: 9 (ref 5–15)
BUN: 16 mg/dL (ref 8–23)
CO2: 28 mmol/L (ref 22–32)
Calcium: 8.7 mg/dL — ABNORMAL LOW (ref 8.9–10.3)
Chloride: 102 mmol/L (ref 98–111)
Creatinine: 0.67 mg/dL (ref 0.44–1.00)
GFR, Estimated: >60 mL/min (ref >60)
Glucose, Bld: 119 mg/dL — ABNORMAL HIGH (ref 70–99)
Potassium: 4 mmol/L (ref 3.5–5.1)
Sodium: 139 mmol/L (ref 135–145)
Total Bilirubin: 0.2 mg/dL — ABNORMAL LOW (ref 0.3–1.2)
Total Protein: 7.5 g/dL (ref 6.5–8.1)

## 2022-08-17 MED ORDER — SODIUM CHLORIDE 0.9 % IV SOLN: Freq: Once | INTRAVENOUS | Status: AC

## 2022-08-17 MED ORDER — DEXTROSE 5 % IV SOLN
34.0000 mg/m2 | Freq: Once | INTRAVENOUS | Status: AC
  Administered 2022-08-17: 60 mg via INTRAVENOUS
  Filled 2022-08-17: qty 30

## 2022-08-17 MED ORDER — SODIUM CHLORIDE 0.9% FLUSH
10.0000 mL | Freq: Once | INTRAVENOUS | Status: AC
  Administered 2022-08-17: 10 mL

## 2022-08-17 MED ORDER — SODIUM CHLORIDE 0.9% FLUSH
10.0000 mL | INTRAVENOUS | Status: DC | PRN
  Administered 2022-08-17: 10 mL

## 2022-08-17 MED ORDER — HEPARIN SOD (PORK) LOCK FLUSH 100 UNIT/ML IV SOLN
500.0000 [IU] | Freq: Once | INTRAVENOUS | Status: AC | PRN
  Administered 2022-08-17: 500 [IU]

## 2022-08-17 NOTE — Patient Instructions (Addendum)
[facility]  Discharge Instructions: Thank you for choosing [facility] to provide your oncology and hematology care.   If you have a lab appointment with the [facility], please go directly to the [facility] and check in at the registration area.   Wear comfortable clothing and clothing appropriate for easy access to any Portacath or PICC line.   We strive to give you quality time with your provider. You may need to reschedule your appointment if you arrive late (15 or more minutes).  Arriving late affects you and other patients whose appointments are after yours.  Also, if you miss three or more appointments without notifying the office, you may be dismissed from the clinic at the provider's discretion.      For prescription refill requests, have your pharmacy contact our office and allow 72 hours for refills to be completed.    Today you received the following chemotherapy and/or immunotherapy agents: Kyprolis      To help prevent nausea and vomiting after your treatment, we encourage you to take your nausea medication as directed.  BELOW ARE SYMPTOMS THAT SHOULD BE REPORTED IMMEDIATELY: *FEVER GREATER THAN 100.4 F (38 C) OR HIGHER *CHILLS OR SWEATING *NAUSEA AND VOMITING THAT IS NOT CONTROLLED WITH YOUR NAUSEA MEDICATION *UNUSUAL SHORTNESS OF BREATH *UNUSUAL BRUISING OR BLEEDING *URINARY PROBLEMS (pain or burning when urinating, or frequent urination) *BOWEL PROBLEMS (unusual diarrhea, constipation, pain near the anus) TENDERNESS IN MOUTH AND THROAT WITH OR WITHOUT PRESENCE OF ULCERS (sore throat, sores in mouth, or a toothache) UNUSUAL RASH, SWELLING OR PAIN  UNUSUAL VAGINAL DISCHARGE OR ITCHING   Items with * indicate a potential emergency and should be followed up as soon as possible or go to the Emergency Department if any problems should occur.  Please show the CHEMOTHERAPY ALERT CARD or IMMUNOTHERAPY ALERT CARD at  check-in to the Emergency Department and triage nurse.  Should you have questions after your visit or need to cancel or reschedule your appointment, please contact [facility]  Dept: [phone]  and follow the prompts.  Office hours are 8:00 a.m. to 4:30 p.m. Monday - Friday. Please note that voicemails left after 4:00 p.m. may not be returned until the following business day.  We are closed weekends and major holidays. You have access to a nurse at all times for urgent questions. Please call the main number to the clinic Dept: [phone] and follow the prompts.   For any non-urgent questions, you may also contact your provider using MyChart. We now offer e-Visits for anyone 62 and older to request care online for non-urgent symptoms. For details visit [email or web address].   Also download the MyChart app! Go to the app store, search "MyChart", open the app, select [facility], and log in with your MyChart username and password.[email or web address]

## 2022-08-17 NOTE — Progress Notes (Signed)
[facility] OFFICE PROGRESS NOTE  Patient Care Team: Default, Provider, MD as PCP - General [facility] Anon Mertie Clause, MD as PCP - Cardiology (Cardiology) [name], MD as Referring Physician (Hematology and Oncology) [name], MD as Consulting Physician (Internal Medicine) [name], [name], MD as Consulting Physician (Infectious Diseases) [name], MD as Consulting Physician (Hematology and Oncology) [name], NP as Nurse Practitioner (Hematology and Oncology) [name], MD as Consulting Physician (Hematology and Oncology)  ASSESSMENT & PLAN:  Multiple myeloma in relapse [facility]) Recent myeloma panel show positive response to therapy We will continue current dose without further dose escalation due to persistent severe anemia We will continue myeloma panel once a month We would defer Zometa for now  Anemia due to antineoplastic chemotherapy Persistent anemia is likely due to side effects of treatment She is somewhat symptomatic but with shortness of breath on exertion She does not need transfusion support Monitor closely  Orders Placed This Encounter  Procedures   CBC with Differential ([facility] Only)    Standing Status:   Future    Standing Expiration Date:   09/30/2023   CMP ([facility] only)    Standing Status:   Future    Standing Expiration Date:   09/30/2023   CBC with Differential ([facility] Only)    Standing Status:   Future    Standing Expiration Date:   10/07/2023   CMP ([facility] only)    Standing Status:   Future    Standing Expiration Date:   10/07/2023   CBC with Differential ([facility] Only)    Standing Status:   Future    Standing Expiration Date:   10/14/2023   CMP ([facility] only)    Standing Status:   Future    Standing Expiration Date:   10/14/2023    All questions were answered. The patient knows to call the clinic with any problems, questions or concerns. The total time  spent in the appointment was 20 minutes encounter with patients including review of chart and various tests results, discussions about plan of care and coordination of care plan   [name], MD 08/17/2022 10:34 AM  INTERVAL HISTORY: Please see below for problem oriented charting. she returns for treatment follow-up on chemotherapy for recurrent multiple myeloma She complains of shortness of breath on exertion Denies nausea or mucositis We reviewed test results  REVIEW OF SYSTEMS:   Constitutional: Denies fevers, chills or abnormal weight loss Eyes: Denies blurriness of vision Ears, nose, mouth, throat, and face: Denies mucositis or sore throat Respiratory: Denies cough, dyspnea or wheezes Cardiovascular: Denies palpitation, chest discomfort or lower extremity swelling Gastrointestinal:  Denies nausea, heartburn or change in bowel habits Skin: Denies abnormal skin rashes Lymphatics: Denies new lymphadenopathy or easy bruising Neurological:Denies numbness, tingling or new weaknesses Behavioral/Psych: Mood is stable, no new changes  All other systems were reviewed with the patient and are negative.  I have reviewed the past medical history, past surgical history, social history and family history with the patient and they are unchanged from previous note.  ALLERGIES:  has No Known Allergies.  MEDICATIONS:  Current Outpatient Medications  Medication Sig Dispense Refill   acyclovir (ZOVIRAX) 400 MG tablet Take 1 tablet (400 mg total) by mouth 2 (two) times daily. 30 tablet 0   aspirin 325 MG tablet Take 325 mg by mouth daily.     calcium carbonate (TUMS - DOSED IN MG ELEMENTAL CALCIUM) 500 MG chewable  tablet Chew 1 tablet by mouth daily.     Carfilzomib (KYPROLIS IV) Inject into the vein once a week.     Cholecalciferol 25 MCG (1000 UT) tablet Take 1,000 Units by mouth daily.     Cyanocobalamin (VITAMIN B12 PO) Take 1 tablet by mouth daily.     cyclophosphamide (CYTOXAN) 50 MG  capsule Take 5 capsules (250 mg total) by mouth once a week. Take with food to minimize GI upset. Take early in the day and maintain hydration. 20 capsule 11   dexamethasone (DECADRON) 4 MG tablet Take 1 tablet (4 mg total) by mouth once a week. Take 1 tablet by mouth 1 hour before chemotherapy 20 tablet 5   loperamide (IMODIUM) 2 MG capsule Take by mouth as needed for diarrhea or loose stools. As needed only     metoprolol tartrate (LOPRESSOR) 25 MG tablet TAKE 1 AND 1/2 TABLETS BY MOUTH TWICE DAILY 90 tablet 0   mirtazapine (REMERON) 7.5 MG tablet Take 1 tablet (7.5 mg total) by mouth at bedtime. 90 tablet 0   Multiple Vitamin (MULTIVITAMIN WITH MINERALS) TABS tablet Take 1 tablet by mouth daily.     Multiple Vitamins-Minerals (PRESERVISION AREDS 2 PO) Take 1 tablet by mouth 2 (two) times daily.     ondansetron (ZOFRAN) 8 MG tablet Take 1 tablet (8 mg total) by mouth every 8 (eight) hours as needed (Nausea or vomiting). 30 tablet 1   pantoprazole (PROTONIX) 40 MG tablet TAKE 1 TABLET BY MOUTH 2 TIMES DAILY. TAKE 30-60 MINUTES BEFORE BREAKFAST AND DINNER. 180 tablet 2   No current facility-administered medications for this visit.   Facility-Administered Medications Ordered in Other Visits  Medication Dose Route Frequency Provider Last Rate Last Admin   carfilzomib (KYPROLIS) 60 mg in dextrose 5 % 100 mL chemo infusion  34 mg/m2 (Treatment Plan Recorded) Intravenous Once [name], MD 260 mL/hr at 08/17/22 1023 60 mg at 08/17/22 1023   heparin lock flush 100 unit/mL  500 Units Intracatheter Once PRN Alvy Bimler, Emmalia Heyboer, MD       sodium chloride flush (NS) 0.9 % injection 10 mL  10 mL Intracatheter PRN Alvy Bimler, Vinita Prentiss, MD        SUMMARY OF ONCOLOGIC HISTORY: Oncology History Overview Note  Multiple myeloma, Ig A Lambda, M spike 3.54 grams, Calcium 9.2, Creatinine 0.8, Beta 2 microglobulin 4.52, IgA 4840 mg/dL, lambda light chain 75.4, albumin 3.6, hemoglobin 9.7, platelet 115    Primary site: Multiple  Myeloma   Staging method: AJCC 6th Edition   Clinical: Stage IIA signed by [name], MD on 11/07/2013  2:46 PM   Summary: Stage IIA S/p Allo transplant at [facility] on revlimid, pomalyst, daratumumab and velcade     Multiple myeloma in relapse ([facility])  10/31/2013 Bone Marrow Biopsy   Bone marrow biopsy confirmed multiple myeloma with 40% bone marrow involvement. Skeletal survey showed minimal lesions in her score with generalized demineralization   11/10/2013 - 02/13/2014 Chemotherapy   The patient is started on induction chemotherapy with weekly dexamethasone 40 mg by mouth as well as Velcade subcutaneous injection on days 1, 4, 8 and 11. On 11/21/2013, she was started on monthly Zometa.   12/23/2013 Adverse Reaction   The dose of Velcade was reduced due to thrombocytopenia.   01/28/2014 - 04/07/2014 Chemotherapy   Revlimid is added. Treatment was discontinued due to lack of response.   02/24/2014 - 04/07/2014 Chemotherapy   Due to worsening peripheral neuropathy, Velcade injection is changed to once a  week. Revlimid was given 21 days on, 7 days off.   04/07/2014 - 04/10/2014 Chemotherapy   Revlimid was discontinued due to lack of response. Chemotherapy was changed back to Velcade injection twice a week, 2 weeks on 1 week off. Her treatment was switched to to minimum response   04/20/2014 - 06/02/2014 Chemotherapy   chemotherapy is switched to Carfilzomib, Cytoxan and dexamethasone.   04/22/2014 Procedure   she has placement of port for chemotherapy.   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 at  [facility]   10/20/2014 - 03/24/2015 Chemotherapy    she received chemotherapy with Kyprolis, Revlimid and dexamethasone   10/22/2014 Procedure   She has port placement   01/19/2015 Tumor Marker   IgA lambda M spike at 0.4 g    01/20/2015 Miscellaneous   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 No cource was found and fever resolved with IV vancomycin and meropenem   09/13/2015 Bone Marrow Biopsy   Bone marrow biopsy showed no increased blasts, 3-4 % plasma cells   03/02/2016 Bone Marrow Biopsy   Bone marrow biopsy at [facility] showed normocellular (30%) bone marrow with trilineage hematopoiesis. No significant increase in blasts. No significant increase in plasma cells.   05/12/2016 Imaging   DEXA scan at [facility] showed osteopenia   10/24/2016 Imaging   Skeletal survey at [facility], no new lesions   12/07/2017 Imaging   No focal abnormality noted to suggest myeloma. Exam is stable from prior exam.   03/01/2018 Procedure   Successful 8 [place] right internal jugular vein power port placement with its tip at the SVC/RA junction.   03/06/2018 -  Chemotherapy   The patient had daratumumab    07/09/2018 Bone Marrow Biopsy   Bone marrow biopsy at [facility] showed residual disease at 0.004% plasma cells   07/03/2019 Bone Marrow Biopsy   A. Bone marrow, flow cytometric analysis for multiple myeloma minimal residual disease detection:   Negative. No phenotypically abnormal plasma cells at or above the limit of detection identified.     No monotypic B-cell population  identified. Negative for increased blasts.   03/25/2021 Bone Marrow Biopsy   Repeat bone marrow biopsy at [facility] showed female donor karyotype.  5 to 7% plasma cell is seen.  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   04/20/2021 Echocardiogram   1. Left ventricular ejection fraction, by estimation, is 60 to 65%. The left ventricle has normal function. The left ventricle has no regional wall motion abnormalities. Left ventricular  diastolic parameters were normal.  2. Right ventricular systolic function is normal. The right ventricular size is normal.  3. The mitral valve is normal in structure. No evidence of mitral valve regurgitation. No evidence of mitral stenosis.  4. The aortic valve is normal in structure. Aortic valve regurgitation is not visualized. No aortic stenosis is present.  5. The inferior vena cava is normal in size with greater than 50% respiratory variability, suggesting right atrial pressure of 3 mmHg.   04/29/2021 - 02/17/2022 Chemotherapy   Patient is on Treatment Plan : MYELOMA RELAPSED/ REFRACTORY Carfilzomib D1,8,15 (20/27) + Dexamethasone (KPd) q28d     04/30/2021 -  Chemotherapy   Patient is on Treatment Plan : MYELOMA RELAPSED/REFRACTORY Carfilzomib (20/70) D1,8,15 + Dexamethasone weekly (40) (Kd) q28d  x 9 cycles / Dexamethasone D1,8,15     05/04/2021 PET scan   1. No evidence active multiple myeloma within the skeleton on FDG PET scan. 2. No suspicious lytic lesion on CT portion exam. 3. No plasmacytoma.   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   10/31/2015 Miscellaneous   She has engrafted     PHYSICAL EXAMINATION: ECOG PERFORMANCE STATUS: 1 - Symptomatic but completely ambulatory  Vitals:   08/17/22 0927  BP: (!) 145/62  Pulse: 73  Resp: 18  Temp: 97.7 F (36.5 C)  SpO2: 100%   Filed Weights   08/17/22 0927  Weight: 153 lb (69.4 kg)    GENERAL:alert, no distress and comfortable  NEURO: alert & oriented x 3 with fluent speech, no focal motor/sensory deficits  LABORATORY DATA:  I have reviewed the data as listed    Component Value Date/Time   NA 139 08/17/2022 0901   NA 142 01/04/2017 0902   K 4.0 08/17/2022 0901   K 3.9 01/04/2017 0902   CL 102 08/17/2022 0901   CO2 28 08/17/2022 0901   CO2 28 01/04/2017 0902   GLUCOSE 119 (H) 08/17/2022 0901   GLUCOSE 92 01/04/2017 0902   BUN 16 08/17/2022 0901   BUN 9.5 01/04/2017 0902   CREATININE 0.67 08/17/2022 0901   CREATININE 0.7 01/04/2017 0902   CALCIUM 8.7 (L) 08/17/2022 0901   CALCIUM 9.0 01/04/2017 0902   PROT 7.5 08/17/2022 0901   PROT 6.0 (L) 01/04/2017 0902   PROT 5.8 (L) 01/04/2017 0902   ALBUMIN 3.6 08/17/2022 0901   ALBUMIN 3.4 (L) 01/04/2017 0902   AST 10 (L) 08/17/2022 0901   AST 21 01/04/2017 0902   ALT 8 08/17/2022 0901   ALT 16 01/04/2017 0902   ALKPHOS 90 08/17/2022 0901   ALKPHOS 101 01/04/2017 0902   BILITOT 0.2 (L) 08/17/2022 0901   BILITOT 0.56 01/04/2017 0902   GFRNONAA >60 08/17/2022 0901   GFRAA >60 03/25/2020 0917   GFRAA >60 12/18/2018 0901    No results found for: "SPEP", "UPEP"  Lab Results  Component Value Date   WBC 6.3 08/17/2022   NEUTROABS 4.9 08/17/2022   HGB 8.3 (L) 08/17/2022    HCT 26.6 (  L) 08/17/2022   MCV 97.8 08/17/2022   PLT 244 08/17/2022      Chemistry      Component Value Date/Time   NA 139 08/17/2022 0901   NA 142 01/04/2017 0902   K 4.0 08/17/2022 0901   K 3.9 01/04/2017 0902   CL 102 08/17/2022 0901   CO2 28 08/17/2022 0901   CO2 28 01/04/2017 0902   BUN 16 08/17/2022 0901   BUN 9.5 01/04/2017 0902   CREATININE 0.67 08/17/2022 0901   CREATININE 0.7 01/04/2017 0902      Component Value Date/Time   CALCIUM 8.7 (L) 08/17/2022 0901   CALCIUM 9.0 01/04/2017 0902   ALKPHOS 90 08/17/2022 0901   ALKPHOS 101 01/04/2017 0902   AST 10 (L) 08/17/2022 0901   AST 21 01/04/2017 0902   ALT 8 08/17/2022 0901   ALT 16 01/04/2017 0902   BILITOT 0.2 (L) 08/17/2022 0901   BILITOT 0.56 01/04/2017 0902

## 2022-08-17 NOTE — Assessment & Plan Note (Signed)
Recent myeloma panel show positive response to therapy We will continue current dose without further dose escalation due to persistent severe anemia We will continue myeloma panel once a month We would defer Zometa for now

## 2022-08-17 NOTE — Progress Notes (Signed)
Patient took her dexamethasone here at 36.

## 2022-08-17 NOTE — Assessment & Plan Note (Signed)
Persistent anemia is likely due to side effects of treatment She is somewhat symptomatic but with shortness of breath on exertion She does not need transfusion support Monitor closely

## 2022-08-18 ENCOUNTER — Other Ambulatory Visit: Payer: Self-pay

## 2022-08-20 ENCOUNTER — Other Ambulatory Visit: Payer: Self-pay

## 2022-08-24 ENCOUNTER — Encounter: Payer: Self-pay | Admitting: Cardiovascular Disease

## 2022-08-24 ENCOUNTER — Inpatient Hospital Stay: Payer: Medicare HMO

## 2022-08-24 ENCOUNTER — Other Ambulatory Visit: Payer: Self-pay

## 2022-08-24 ENCOUNTER — Other Ambulatory Visit: Payer: Self-pay | Admitting: Hematology and Oncology

## 2022-08-24 ENCOUNTER — Inpatient Hospital Stay (HOSPITAL_BASED_OUTPATIENT_CLINIC_OR_DEPARTMENT_OTHER): Payer: Medicare HMO | Admitting: Hematology and Oncology

## 2022-08-24 ENCOUNTER — Ambulatory Visit: Payer: Medicare HMO | Attending: Cardiovascular Disease | Admitting: Cardiovascular Disease

## 2022-08-24 ENCOUNTER — Telehealth: Payer: Self-pay

## 2022-08-24 ENCOUNTER — Encounter: Payer: Self-pay | Admitting: Hematology and Oncology

## 2022-08-24 VITALS — BP 130/68 | HR 71 | Ht 62.0 in | Wt 152.4 lb

## 2022-08-24 VITALS — BP 130/68 | HR 71 | Resp 18 | Ht 62.0 in | Wt 152.6 lb

## 2022-08-24 DIAGNOSIS — D469 Myelodysplastic syndrome, unspecified: Secondary | ICD-10-CM

## 2022-08-24 DIAGNOSIS — Z5111 Encounter for antineoplastic chemotherapy: Secondary | ICD-10-CM | POA: Diagnosis not present

## 2022-08-24 DIAGNOSIS — D61818 Other pancytopenia: Secondary | ICD-10-CM

## 2022-08-24 DIAGNOSIS — I10 Essential (primary) hypertension: Secondary | ICD-10-CM | POA: Diagnosis not present

## 2022-08-24 DIAGNOSIS — C9002 Multiple myeloma in relapse: Secondary | ICD-10-CM | POA: Diagnosis not present

## 2022-08-24 DIAGNOSIS — I471 Supraventricular tachycardia, unspecified: Secondary | ICD-10-CM | POA: Diagnosis not present

## 2022-08-24 LAB — CBC WITH DIFFERENTIAL (CANCER CENTER ONLY)
Abs Immature Granulocytes: 0.02 10*3/uL (ref 0.00–0.07)
Basophils Absolute: 0 10*3/uL (ref 0.0–0.1)
Basophils Relative: 0 %
Eosinophils Absolute: 0 10*3/uL (ref 0.0–0.5)
Eosinophils Relative: 0 %
HCT: 25 % — ABNORMAL LOW (ref 36.0–46.0)
Hemoglobin: 7.7 g/dL — ABNORMAL LOW (ref 12.0–15.0)
Immature Granulocytes: 0 %
Lymphocytes Relative: 15 %
Lymphs Abs: 0.9 10*3/uL (ref 0.7–4.0)
MCH: 30.4 pg (ref 26.0–34.0)
MCHC: 30.8 g/dL (ref 30.0–36.0)
MCV: 98.8 fL (ref 80.0–100.0)
Monocytes Absolute: 0.6 10*3/uL (ref 0.1–1.0)
Monocytes Relative: 11 %
Neutro Abs: 4.4 10*3/uL (ref 1.7–7.7)
Neutrophils Relative %: 74 %
Platelet Count: 228 10*3/uL (ref 150–400)
RBC: 2.53 MIL/uL — ABNORMAL LOW (ref 3.87–5.11)
RDW: 16.5 % — ABNORMAL HIGH (ref 11.5–15.5)
WBC Count: 5.9 10*3/uL (ref 4.0–10.5)
nRBC: 0 % (ref 0.0–0.2)

## 2022-08-24 LAB — PREPARE RBC (CROSSMATCH)

## 2022-08-24 LAB — SAMPLE TO BLOOD BANK

## 2022-08-24 MED ORDER — SODIUM CHLORIDE 0.9% FLUSH
10.0000 mL | Freq: Once | INTRAVENOUS | Status: AC
  Administered 2022-08-24: 10 mL

## 2022-08-24 NOTE — Telephone Encounter (Signed)
Called back and scheduled port lab/flush and appt with Dr. Alvy Bimler today. She is aware of appt today.

## 2022-08-24 NOTE — Telephone Encounter (Signed)
She called and left a message. She is feeling exhausted and short of breath with minimal exertion. She saw cardiology today and oxygen saturation was 93%. She is asking if she can get a blood transfusion next week to help with the exhaustion. She is available every day except Friday of next week.

## 2022-08-24 NOTE — Progress Notes (Signed)
Cardiology Office Note   Date:  08/24/2022   ID:  Jean Davidson, DOB [birth year], MRN [id number]  PCP:  Default, Provider, MD  Cardiologist:   Kathlyn Sacramento, MD   Chief Complaint  Patient presents with   Other    6 month f/u c/o sob and low HR. Meds reviewed verbally with pt.      History of Present Illness: Jean Davidson is a 77 y.o. female who is here today for a follow-up visit regarding paroxysmal supraventricular tachycardia.   She has known history of  multiple myeloma status post bone marrow transplant in 2016 and 2020, pericardial effusion that did not require intervention, essential hypertension, anemia and cervical spine stenosis. She has known history of palpitations.  Previous ZIO monitor showed frequent runs of short SVT.  Most recent echocardiogram in October 2022 showed normal LV systolic function with no significant valvular abnormalities. Her symptoms improved with metoprolol.  She was most recently seen in August 2023 and was relatively stable at that time.  She has been doing reasonably well from a cardiac standpoint with no chest pain.  She does report increased fatigue and shortness of breath which she attributes to her anemia.  She was visiting her daughter in November and fell which resulted in left ankle fracture that required surgery.  She just started walking again.  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   Multiple myeloma ([place])    Osteopenia    Pinched nerve    Sinusitis, bacterial 07/27/2015   Spinal stenosis in cervical region    Varicose veins of lower extremities with inflammation     Past Surgical History:  Procedure Laterality Date   ANKLE SURGERY Left    BLADDER SUSPENSION  1988/1989   x2   COLONOSCOPY     ESOPHAGOGASTRODUODENOSCOPY     IR IMAGING GUIDED PORT INSERTION  03/01/2018   LIMBAL STEM CELL TRANSPLANT     x2   PORT-A-CATH REMOVAL     SHOULDER SURGERY Right 04/06/2013   right   UPPER GASTROINTESTINAL ENDOSCOPY       Current Outpatient Medications  Medication Sig Dispense Refill   acyclovir (ZOVIRAX) 400 MG tablet Take 1 tablet (400 mg total) by mouth 2 (two) times daily. 30 tablet 0   aspirin 325 MG tablet Take 325 mg by mouth daily.     calcium carbonate (TUMS - DOSED IN MG ELEMENTAL CALCIUM) 500 MG chewable tablet Chew 1 tablet by mouth daily.     Carfilzomib (KYPROLIS IV) Inject into the vein once a week.     Cholecalciferol 25 MCG (1000 UT) tablet Take 1,000 Units by mouth daily.     Cyanocobalamin (VITAMIN B12 PO) Take 1 tablet by mouth daily.  cyclophosphamide (CYTOXAN) 50 MG capsule Take 5 capsules (250 mg total) by mouth once a week. Take with food to minimize GI upset. Take early in the day and maintain hydration. 20 capsule 11   dexamethasone (DECADRON) 4 MG tablet Take 1 tablet (4 mg total) by mouth once a week. Take 1 tablet by mouth 1 hour before chemotherapy 20 tablet 5   loperamide (IMODIUM) 2 MG capsule Take by mouth as needed for diarrhea or loose stools. As needed only     metoprolol tartrate (LOPRESSOR) 25 MG tablet TAKE 1 AND 1/2 TABLETS BY MOUTH TWICE DAILY 90 tablet 0   mirtazapine (REMERON) 7.5 MG tablet Take 1 tablet (7.5 mg total) by mouth at bedtime. 90 tablet 0   Multiple Vitamin (MULTIVITAMIN WITH MINERALS) TABS tablet Take 1 tablet by mouth daily.     Multiple  Vitamins-Minerals (PRESERVISION AREDS 2 PO) Take 1 tablet by mouth 2 (two) times daily.     ondansetron (ZOFRAN) 8 MG tablet Take 1 tablet (8 mg total) by mouth every 8 (eight) hours as needed (Nausea or vomiting). 30 tablet 1   pantoprazole (PROTONIX) 40 MG tablet TAKE 1 TABLET BY MOUTH 2 TIMES DAILY. TAKE 30-60 MINUTES BEFORE BREAKFAST AND DINNER. 180 tablet 2   No current facility-administered medications for this visit.    Allergies:   Patient has no known allergies.    Social History:  The patient  reports that she quit smoking about 54 years ago. Her smoking use included cigarettes. She has a 2.00 pack-year smoking history. She has never used smokeless tobacco. She reports current alcohol use of about 1.0 standard drink of alcohol per week. She reports that she does not use drugs.   Family History:  The patient's family history includes Colon cancer (age of onset: 29) in her paternal aunt; Diabetes in her father; Heart disease in her brother, father, and mother; Heart failure in her father; Hypertension in her brother; Kidney disease in her father; Skin cancer in her brother and father; Throat cancer in her father.    ROS:  Please see the history of present illness.   Otherwise, review of systems are positive for none.   All other systems are reviewed and negative.    PHYSICAL EXAM: VS:  BP 130/68 (BP Location: Right Arm, Patient Position: Sitting, Cuff Size: Normal)   Pulse 71   Ht '5\' 2"'$  (1.575 m)   Wt 152 lb 6 oz (69.1 kg)   SpO2 93%   BMI 27.87 kg/m  , BMI Body mass index is 27.87 kg/m. GEN: Well nourished, well developed, in no acute distress  HEENT: normal  Neck: no JVD, carotid bruits, or masses Cardiac: RRR; no murmurs, rubs, or gallops, mild bilateral leg edema  Respiratory:  clear to auscultation bilaterally, normal work of breathing GI: soft, nontender, nondistended, + BS MS: no deformity or atrophy  Skin: warm and dry, no rash Neuro:  Strength and sensation are  intact Psych: euthymic mood, full affect    EKG:  EKG is ordered today. The ekg ordered today demonstrates normal sinus rhythm with no significant ST or T wave changes.   Recent Labs: 08/17/2022: ALT 8; BUN 16; Creatinine 0.67; Hemoglobin 8.3; Platelet Count 244; Potassium 4.0; Sodium 139    Lipid Panel    Component Value Date/Time   CHOL 110 08/08/2013 1127   TRIG 82.0 08/08/2013 1127   HDL 36.50 (L) 08/08/2013 1127   CHOLHDL 3 08/08/2013 1127   VLDL 16.4 08/08/2013 1127  LDLCALC 57 08/08/2013 1127      Wt Readings from Last 3 Encounters:  08/24/22 152 lb 6 oz (69.1 kg)  08/17/22 153 lb (69.4 kg)  08/10/22 154 lb (69.9 kg)           No data to display            ASSESSMENT AND PLAN:   1.  Paroxysmal supraventricular tachycardia: Symptoms are well controlled with small dose metoprolol.    2.  History pericardial effusion: Most recent echocardiogram in 2022 showed no evidence of pericardial effusion.  3.  Essential hypertension: Blood pressure is controlled.  4.  Multiple myeloma: Still requiring chemotherapy.  Anemia is likely contributing to her fatigue and shortness of breath.  Consider transfusion. If her fatigue and shortness of breath persist, we can consider repeat echocardiogram.  EKG with no ischemic changes.    Disposition:   FU with me in 6 months  Signed,  Kathlyn Sacramento, MD  08/24/2022 10:14 AM    [place]

## 2022-08-24 NOTE — Telephone Encounter (Signed)
How about today or tomorrow? Can she come in for eval?

## 2022-08-24 NOTE — Assessment & Plan Note (Signed)
Severe anemia is due to side effects of chemo She is symptomatic We discussed transfusion support and she is in agreement We discussed some of the risks, benefits, and alternatives of blood transfusions. The patient is symptomatic from anemia and the hemoglobin level is critically low.  Some of the side-effects to be expected including risks of transfusion reactions, chills, infection, syndrome of volume overload and risk of hospitalization from various reasons and the patient is willing to proceed and went ahead to sign consent today. Due to the time of the day, she will return tomorrow to get blood transfusion, 1 unit

## 2022-08-24 NOTE — Patient Instructions (Signed)
Medication Instructions:  No changes *If you need a refill on your cardiac medications before your next appointment, please call your pharmacy*   Lab Work: None ordered If you have labs (blood work) drawn today and your tests are completely normal, you will receive your results only by: [place] (if you have MyChart) OR A paper copy in the mail If you have any lab test that is abnormal or we need to change your treatment, we will call you to review the results.   Testing/Procedures: None ordered   Follow-Up: At [facility], you and your health needs are our priority.  As part of our continuing mission to provide you with exceptional heart care, we have created designated Provider Care Teams.  These Care Teams include your primary Cardiologist (physician) and Advanced Practice Providers (APPs -  Physician Assistants and Nurse Practitioners) who all work together to provide you with the care you need, when you need it.  We recommend signing up for the patient portal called "MyChart".  Sign up information is provided on this After Visit Summary.  MyChart is used to connect with patients for Virtual Visits (Telemedicine).  Patients are able to view lab/test results, encounter notes, upcoming appointments, etc.  Non-urgent messages can be sent to your provider as well.   To learn more about what you can do with MyChart, go to [email or web address].    Your next appointment:   6 month(s)  Provider:   You may see Kathlyn Sacramento, MD or one of the following Advanced Practice Providers on your designated Care Team:   Murray Hodgkins, NP Christell Faith, PA-C Cadence Kathlen Mody, PA-C Gerrie Nordmann, NP

## 2022-08-24 NOTE — Assessment & Plan Note (Signed)
She is profoundly symptomatic with anemia The most likely cause of her anemia is due to his Cytoxan Next week, I recommend the patient not to take Cytoxan until we repeat her blood draw She would likely need dose adjustment

## 2022-08-24 NOTE — Progress Notes (Signed)
[place] OFFICE PROGRESS NOTE  Patient Care Team: Default, Provider, MD as PCP - General Fletcher Anon Mertie Clause, MD as PCP - Cardiology (Cardiology) [name], MD as Referring Physician (Hematology and Oncology) [name], MD as Consulting Physician (Internal Medicine) [name], [name], MD as Consulting Physician (Infectious Diseases) [name], MD as Consulting Physician (Hematology and Oncology) [name], NP as Nurse Practitioner (Hematology and Oncology) [name], MD as Consulting Physician (Hematology and Oncology)  ASSESSMENT & PLAN:  Multiple myeloma in relapse [facility]) She is profoundly symptomatic with anemia The most likely cause of her anemia is due to his Cytoxan Next week, I recommend the patient not to take Cytoxan until we repeat her blood draw She would likely need dose adjustment   Pancytopenia, acquired ([place]) Severe anemia is due to side effects of chemo She is symptomatic We discussed transfusion support and she is in agreement We discussed some of the risks, benefits, and alternatives of blood transfusions. The patient is symptomatic from anemia and the hemoglobin level is critically low.  Some of the side-effects to be expected including risks of transfusion reactions, chills, infection, syndrome of volume overload and risk of hospitalization from various reasons and the patient is willing to proceed and went ahead to sign consent today. Due to the time of the day, she will return tomorrow to get blood transfusion, 1 unit  Orders Placed This Encounter  Procedures   Informed Consent Details: Physician/Practitioner Attestation; Transcribe to consent form and obtain patient signature    Standing Status:   Future    Standing Expiration Date:   08/24/2023    Order Specific Question:   Physician/Practitioner attestation of informed consent for blood and or blood product transfusion    Answer:   I, the  physician/practitioner, attest that I have discussed with the patient the benefits, risks, side effects, alternatives, likelihood of achieving goals and potential problems during recovery for the procedure that I have provided informed consent.    Order Specific Question:   Product(s)    Answer:   All Product(s)   Care order/instruction    Transfuse Parameters    Standing Status:   Future    Standing Expiration Date:   08/24/2023   Type and screen         Standing Status:   Future    Number of Occurrences:   1    Standing Expiration Date:   08/24/2023   Prepare RBC (crossmatch)    Standing Status:   Standing    Number of Occurrences:   1    Order Specific Question:   # of Units    Answer:   1 unit    Order Specific Question:   Transfusion Indications    Answer:   Hemoglobin 8 gm/dL or less and orthopedic or cardiac surgery or pre-existing cardiac condition    Order Specific Question:   Special Requirements    Answer:   Irradiated-IRR    Order Specific Question:   Number of Units to Keep Ahead    Answer:   NO units ahead    Order Specific Question:   Instructions:    Answer:   Transfuse    Order Specific Question:   If emergent release call blood bank    Answer:   Not emergent release   Sample to Blood Bank    Standing Status:   Standing    Number of Occurrences:   33    Standing  Expiration Date:   08/24/2023    All questions were answered. The patient knows to call the clinic with any problems, questions or concerns. The total time spent in the appointment was 30 minutes encounter with patients including review of chart and various tests results, discussions about plan of care and coordination of care plan   [name], MD 08/24/2022 3:09 PM  INTERVAL HISTORY: Please see below for problem oriented charting. she returns for urgent evaluation today She complained of excessive fatigue and shortness of breath on minimal exertion She denies recent bleeding We reviewed her test  results and discussed plan of care  REVIEW OF SYSTEMS:   Constitutional: Denies fevers, chills or abnormal weight loss Eyes: Denies blurriness of vision Ears, nose, mouth, throat, and face: Denies mucositis or sore throat Cardiovascular: Denies palpitation, chest discomfort or lower extremity swelling Gastrointestinal:  Denies nausea, heartburn or change in bowel habits Skin: Denies abnormal skin rashes Lymphatics: Denies new lymphadenopathy or easy bruising Neurological:Denies numbness, tingling or new weaknesses Behavioral/Psych: Mood is stable, no new changes  All other systems were reviewed with the patient and are negative.  I have reviewed the past medical history, past surgical history, social history and family history with the patient and they are unchanged from previous note.  ALLERGIES:  has No Known Allergies.  MEDICATIONS:  Current Outpatient Medications  Medication Sig Dispense Refill   acyclovir (ZOVIRAX) 400 MG tablet Take 1 tablet (400 mg total) by mouth 2 (two) times daily. 30 tablet 0   aspirin 325 MG tablet Take 325 mg by mouth daily.     calcium carbonate (TUMS - DOSED IN MG ELEMENTAL CALCIUM) 500 MG chewable tablet Chew 1 tablet by mouth daily.     Carfilzomib (KYPROLIS IV) Inject into the vein once a week.     Cholecalciferol 25 MCG (1000 UT) tablet Take 1,000 Units by mouth daily.     Cyanocobalamin (VITAMIN B12 PO) Take 1 tablet by mouth daily.     cyclophosphamide (CYTOXAN) 50 MG capsule Take 5 capsules (250 mg total) by mouth once a week. Take with food to minimize GI upset. Take early in the day and maintain hydration. 20 capsule 11   dexamethasone (DECADRON) 4 MG tablet Take 1 tablet (4 mg total) by mouth once a week. Take 1 tablet by mouth 1 hour before chemotherapy 20 tablet 5   loperamide (IMODIUM) 2 MG capsule Take by mouth as needed for diarrhea or loose stools. As needed only     metoprolol tartrate (LOPRESSOR) 25 MG tablet TAKE 1 AND 1/2 TABLETS BY  MOUTH TWICE DAILY 90 tablet 0   mirtazapine (REMERON) 7.5 MG tablet Take 1 tablet (7.5 mg total) by mouth at bedtime. 90 tablet 0   Multiple Vitamin (MULTIVITAMIN WITH MINERALS) TABS tablet Take 1 tablet by mouth daily.     Multiple Vitamins-Minerals (PRESERVISION AREDS 2 PO) Take 1 tablet by mouth 2 (two) times daily.     ondansetron (ZOFRAN) 8 MG tablet Take 1 tablet (8 mg total) by mouth every 8 (eight) hours as needed (Nausea or vomiting). 30 tablet 1   pantoprazole (PROTONIX) 40 MG tablet TAKE 1 TABLET BY MOUTH 2 TIMES DAILY. TAKE 30-60 MINUTES BEFORE BREAKFAST AND DINNER. 180 tablet 2   No current facility-administered medications for this visit.    SUMMARY OF ONCOLOGIC HISTORY: Oncology History Overview Note  Multiple myeloma, Ig A Lambda, M spike 3.54 grams, Calcium 9.2, Creatinine 0.8, Beta 2 microglobulin 4.52, IgA 4840 mg/dL, lambda light  chain 75.4, albumin 3.6, hemoglobin 9.7, platelet 115    Primary site: Multiple Myeloma   Staging method: AJCC 6th Edition   Clinical: Stage IIA signed by [name], MD on 11/07/2013  2:46 PM   Summary: Stage IIA S/p Allo transplant at [facility] on revlimid, pomalyst, daratumumab and velcade     Multiple myeloma in relapse ([place])  10/31/2013 Bone Marrow Biopsy   Bone marrow biopsy confirmed multiple myeloma with 40% bone marrow involvement. Skeletal survey showed minimal lesions in her score with generalized demineralization   11/10/2013 - 02/13/2014 Chemotherapy   The patient is started on induction chemotherapy with weekly dexamethasone 40 mg by mouth as well as Velcade subcutaneous injection on days 1, 4, 8 and 11. On 11/21/2013, she was started on monthly Zometa.   12/23/2013 Adverse Reaction   The dose of Velcade was reduced due to thrombocytopenia.   01/28/2014 - 04/07/2014 Chemotherapy   Revlimid is added. Treatment was discontinued due to lack of response.   02/24/2014 - 04/07/2014 Chemotherapy   Due to worsening peripheral  neuropathy, Velcade injection is changed to once a week. Revlimid was given 21 days on, 7 days off.   04/07/2014 - 04/10/2014 Chemotherapy   Revlimid was discontinued due to lack of response. Chemotherapy was changed back to Velcade injection twice a week, 2 weeks on 1 week off. Her treatment was switched to to minimum response   04/20/2014 - 06/02/2014 Chemotherapy   chemotherapy is switched to Carfilzomib, Cytoxan and dexamethasone.   04/22/2014 Procedure   she has placement of port for chemotherapy.   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 at [facility]   10/20/2014 - 03/24/2015 Chemotherapy    she received chemotherapy with Kyprolis, Revlimid and dexamethasone   10/22/2014 Procedure   She has port placement   01/19/2015 Tumor Marker   IgA lambda M spike at 0.4 g    01/20/2015 Miscellaneous   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 No cource was found and fever resolved with IV vancomycin and meropenem   09/13/2015 Bone Marrow Biopsy   Bone marrow biopsy showed no increased blasts, 3-4 % plasma cells   03/02/2016 Bone Marrow Biopsy   Bone marrow biopsy at  [facility] showed normocellular (30%) bone marrow with trilineage hematopoiesis. No significant increase in blasts. No significant increase in plasma cells.   05/12/2016 Imaging   DEXA scan at [place] showed osteopenia   10/24/2016 Imaging   Skeletal survey at [facility], no new lesions   12/07/2017 Imaging   No focal abnormality noted to suggest myeloma. Exam is stable from prior exam.   03/01/2018 Procedure   Successful 8 French right  internal jugular vein power port placement with its tip at the SVC/RA junction.   03/06/2018 -  Chemotherapy   The patient had daratumumab    07/09/2018 Bone Marrow Biopsy   Bone marrow biopsy at [facility] showed residual disease at 0.004% plasma cells   07/03/2019 Bone Marrow Biopsy   A. Bone marrow, flow cytometric analysis for multiple myeloma minimal residual disease detection:   Negative. No phenotypically abnormal plasma cells at or above the limit of detection identified.     No monotypic B-cell population identified. Negative for increased blasts.   03/25/2021 Bone Marrow Biopsy   Repeat bone marrow biopsy at [place] showed female donor karyotype.  5 to 7% plasma cell is seen.  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   04/20/2021 Echocardiogram   1. Left ventricular ejection fraction, by estimation, is 60 to 65%. The left ventricle has normal function. The left ventricle has no regional wall motion abnormalities. Left ventricular diastolic parameters were normal.  2. Right ventricular systolic function is normal. The right ventricular size is normal.  3. The mitral valve is normal in structure. No evidence of mitral valve regurgitation. No evidence of mitral stenosis.  4. The aortic valve is normal in structure. Aortic valve regurgitation is not visualized. No aortic stenosis is present.  5. The inferior vena  cava is normal in size with greater than 50% respiratory variability, suggesting right atrial pressure of 3 mmHg.   04/29/2021 - 02/17/2022 Chemotherapy   Patient is on Treatment Plan : MYELOMA RELAPSED/ REFRACTORY Carfilzomib D1,8,15 (20/27) + Dexamethasone (KPd) q28d     04/30/2021 -  Chemotherapy   Patient is on Treatment Plan : MYELOMA RELAPSED/REFRACTORY Carfilzomib (20/70) D1,8,15 + Dexamethasone weekly (40) (Kd) q28d  x 9 cycles / Dexamethasone D1,8,15     05/04/2021 PET scan   1. No evidence active multiple myeloma within the skeleton on FDG PET scan. 2. No suspicious lytic lesion on CT portion exam. 3. No plasmacytoma.   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   10/31/2015 Miscellaneous   She has engrafted     PHYSICAL EXAMINATION: ECOG PERFORMANCE STATUS: 1 - Symptomatic but completely ambulatory  Vitals:   08/24/22 1339  BP: 130/68  Pulse: 71  Resp: 18  SpO2: 93%   Filed Weights   08/24/22 1339  Weight: 152 lb 9.6 oz (69.2 kg)    GENERAL:alert, no distress and comfortable.  She looks pale NEURO: alert & oriented x 3 with fluent speech, no focal motor/sensory deficits  LABORATORY DATA:  I have reviewed the data as listed    Component Value Date/Time   NA 139 08/17/2022 0901   NA 142 01/04/2017 0902   K 4.0 08/17/2022 0901   K 3.9 01/04/2017 0902   CL 102 08/17/2022 0901    CO2 28 08/17/2022 0901   CO2 28 01/04/2017  0902   GLUCOSE 119 (H) 08/17/2022 0901   GLUCOSE 92 01/04/2017 0902   BUN 16 08/17/2022 0901   BUN 9.5 01/04/2017 0902   CREATININE 0.67 08/17/2022 0901   CREATININE 0.7 01/04/2017 0902   CALCIUM 8.7 (L) 08/17/2022 0901   CALCIUM 9.0 01/04/2017 0902   PROT 7.5 08/17/2022 0901   PROT 6.0 (L) 01/04/2017 0902   PROT 5.8 (L) 01/04/2017 0902   ALBUMIN 3.6 08/17/2022 0901   ALBUMIN 3.4 (L) 01/04/2017 0902   AST 10 (L) 08/17/2022 0901   AST 21 01/04/2017 0902   ALT 8 08/17/2022 0901   ALT 16 01/04/2017 0902   ALKPHOS 90 08/17/2022 0901   ALKPHOS 101 01/04/2017 0902   BILITOT 0.2 (L) 08/17/2022 0901   BILITOT 0.56 01/04/2017 0902   GFRNONAA >60 08/17/2022 0901   GFRAA >60 03/25/2020 0917   GFRAA >60 12/18/2018 0901    No results found for: "SPEP", "UPEP"  Lab Results  Component Value Date   WBC 5.9 08/24/2022   NEUTROABS 4.4 08/24/2022   HGB 7.7 (L) 08/24/2022   HCT 25.0 (L) 08/24/2022   MCV 98.8 08/24/2022   PLT 228 08/24/2022      Chemistry      Component Value Date/Time   NA 139 08/17/2022 0901   NA 142 01/04/2017 0902   K 4.0 08/17/2022 0901   K 3.9 01/04/2017 0902   CL 102 08/17/2022 0901   CO2 28 08/17/2022 0901   CO2 28 01/04/2017 0902   BUN 16 08/17/2022 0901   BUN 9.5 01/04/2017 0902   CREATININE 0.67 08/17/2022 0901   CREATININE 0.7 01/04/2017 0902      Component Value Date/Time   CALCIUM 8.7 (L) 08/17/2022 0901   CALCIUM 9.0 01/04/2017 0902   ALKPHOS 90 08/17/2022 0901   ALKPHOS 101 01/04/2017 0902   AST 10 (L) 08/17/2022 0901   AST 21 01/04/2017 0902   ALT 8 08/17/2022 0901   ALT 16 01/04/2017 0902   BILITOT 0.2 (L) 08/17/2022 0901   BILITOT 0.56 01/04/2017 0902

## 2022-08-25 ENCOUNTER — Other Ambulatory Visit: Payer: Self-pay

## 2022-08-25 ENCOUNTER — Inpatient Hospital Stay: Payer: Medicare HMO | Attending: Hematology and Oncology

## 2022-08-25 DIAGNOSIS — Z5111 Encounter for antineoplastic chemotherapy: Secondary | ICD-10-CM | POA: Insufficient documentation

## 2022-08-25 DIAGNOSIS — D61818 Other pancytopenia: Secondary | ICD-10-CM

## 2022-08-25 DIAGNOSIS — C9002 Multiple myeloma in relapse: Secondary | ICD-10-CM | POA: Diagnosis not present

## 2022-08-25 MED ORDER — HEPARIN SOD (PORK) LOCK FLUSH 100 UNIT/ML IV SOLN
250.0000 [IU] | INTRAVENOUS | Status: AC | PRN
  Administered 2022-08-25: 500 [IU]

## 2022-08-25 MED ORDER — ACETAMINOPHEN 325 MG PO TABS: 650.0000 mg | ORAL_TABLET | Freq: Once | ORAL | Status: DC

## 2022-08-25 MED ORDER — SODIUM CHLORIDE 0.9% FLUSH
10.0000 mL | INTRAVENOUS | Status: AC | PRN
  Administered 2022-08-25: 10 mL

## 2022-08-25 MED ORDER — SODIUM CHLORIDE 0.9% IV SOLUTION
250.0000 mL | Freq: Once | INTRAVENOUS | Status: AC
  Administered 2022-08-25: 250 mL via INTRAVENOUS

## 2022-08-25 NOTE — Patient Instructions (Signed)

## 2022-08-26 LAB — BPAM RBC
Blood Product Expiration Date: 202403222359
ISSUE DATE / TIME: 202403011322
Unit Type and Rh: 6200

## 2022-08-26 LAB — TYPE AND SCREEN
ABO/RH(D): A POS
Antibody Screen: NEGATIVE
Unit division: 0

## 2022-08-29 ENCOUNTER — Other Ambulatory Visit: Payer: Self-pay | Admitting: Hematology and Oncology

## 2022-08-29 DIAGNOSIS — C9002 Multiple myeloma in relapse: Secondary | ICD-10-CM

## 2022-08-31 ENCOUNTER — Encounter: Payer: Self-pay | Admitting: Hematology and Oncology

## 2022-08-31 ENCOUNTER — Inpatient Hospital Stay: Payer: Medicare HMO

## 2022-08-31 ENCOUNTER — Inpatient Hospital Stay: Payer: Medicare HMO | Admitting: Hematology and Oncology

## 2022-08-31 ENCOUNTER — Inpatient Hospital Stay (HOSPITAL_BASED_OUTPATIENT_CLINIC_OR_DEPARTMENT_OTHER): Payer: Medicare HMO

## 2022-08-31 VITALS — BP 161/68 | HR 84 | Temp 98.2°F | Resp 17 | Wt 154.4 lb

## 2022-08-31 DIAGNOSIS — D469 Myelodysplastic syndrome, unspecified: Secondary | ICD-10-CM

## 2022-08-31 DIAGNOSIS — D61818 Other pancytopenia: Secondary | ICD-10-CM | POA: Diagnosis not present

## 2022-08-31 DIAGNOSIS — C9002 Multiple myeloma in relapse: Secondary | ICD-10-CM

## 2022-08-31 DIAGNOSIS — Z5111 Encounter for antineoplastic chemotherapy: Secondary | ICD-10-CM | POA: Diagnosis not present

## 2022-08-31 LAB — CBC WITH DIFFERENTIAL (CANCER CENTER ONLY)
Abs Immature Granulocytes: 0.02 10*3/uL (ref 0.00–0.07)
Basophils Absolute: 0 10*3/uL (ref 0.0–0.1)
Basophils Relative: 0 %
Eosinophils Absolute: 0 10*3/uL (ref 0.0–0.5)
Eosinophils Relative: 0 %
HCT: 29 % — ABNORMAL LOW (ref 36.0–46.0)
Hemoglobin: 9.2 g/dL — ABNORMAL LOW (ref 12.0–15.0)
Immature Granulocytes: 0 %
Lymphocytes Relative: 16 %
Lymphs Abs: 0.8 10*3/uL (ref 0.7–4.0)
MCH: 30.6 pg (ref 26.0–34.0)
MCHC: 31.7 g/dL (ref 30.0–36.0)
MCV: 96.3 fL (ref 80.0–100.0)
Monocytes Absolute: 0.6 10*3/uL (ref 0.1–1.0)
Monocytes Relative: 12 %
Neutro Abs: 3.7 10*3/uL (ref 1.7–7.7)
Neutrophils Relative %: 72 %
Platelet Count: 279 10*3/uL (ref 150–400)
RBC: 3.01 MIL/uL — ABNORMAL LOW (ref 3.87–5.11)
RDW: 17.2 % — ABNORMAL HIGH (ref 11.5–15.5)
WBC Count: 5.2 10*3/uL (ref 4.0–10.5)
nRBC: 0 % (ref 0.0–0.2)

## 2022-08-31 LAB — CMP (CANCER CENTER ONLY)
ALT: 6 U/L (ref 0–44)
AST: 10 U/L — ABNORMAL LOW (ref 15–41)
Albumin: 3.7 g/dL (ref 3.5–5.0)
Alkaline Phosphatase: 86 U/L (ref 38–126)
Anion gap: 8 (ref 5–15)
BUN: 16 mg/dL (ref 8–23)
CO2: 29 mmol/L (ref 22–32)
Calcium: 9.2 mg/dL (ref 8.9–10.3)
Chloride: 103 mmol/L (ref 98–111)
Creatinine: 0.66 mg/dL (ref 0.44–1.00)
GFR, Estimated: >60 mL/min (ref >60)
Glucose, Bld: 101 mg/dL — ABNORMAL HIGH (ref 70–99)
Potassium: 3.9 mmol/L (ref 3.5–5.1)
Sodium: 140 mmol/L (ref 135–145)
Total Bilirubin: 0.3 mg/dL (ref 0.3–1.2)
Total Protein: 7.6 g/dL (ref 6.5–8.1)

## 2022-08-31 LAB — SAMPLE TO BLOOD BANK

## 2022-08-31 MED ORDER — SODIUM CHLORIDE 0.9% FLUSH
10.0000 mL | INTRAVENOUS | Status: DC | PRN
  Administered 2022-08-31: 10 mL

## 2022-08-31 MED ORDER — MIRTAZAPINE 7.5 MG PO TABS: 7.5000 mg | ORAL_TABLET | Freq: Every day | ORAL | 3 refills | Status: DC

## 2022-08-31 MED ORDER — DEXTROSE 5 % IV SOLN
34.0000 mg/m2 | Freq: Once | INTRAVENOUS | Status: AC
  Administered 2022-08-31: 60 mg via INTRAVENOUS
  Filled 2022-08-31: qty 30

## 2022-08-31 MED ORDER — SODIUM CHLORIDE 0.9 % IV SOLN: Freq: Once | INTRAVENOUS | Status: AC

## 2022-08-31 MED ORDER — SODIUM CHLORIDE 0.9 % IV SOLN: Freq: Once | INTRAVENOUS | Status: DC

## 2022-08-31 MED ORDER — SODIUM CHLORIDE 0.9% FLUSH
10.0000 mL | Freq: Once | INTRAVENOUS | Status: AC
  Administered 2022-08-31: 10 mL

## 2022-08-31 MED ORDER — HEPARIN SOD (PORK) LOCK FLUSH 100 UNIT/ML IV SOLN
500.0000 [IU] | Freq: Once | INTRAVENOUS | Status: AC | PRN
  Administered 2022-08-31: 500 [IU]

## 2022-08-31 NOTE — Patient Instructions (Signed)
[facility]  Discharge Instructions: Thank you for choosing [facility] to provide your oncology and hematology care.   If you have a lab appointment with the [facility], please go directly to the [facility] and check in at the registration area.   Wear comfortable clothing and clothing appropriate for easy access to any Portacath or PICC line.   We strive to give you quality time with your provider. You may need to reschedule your appointment if you arrive late (15 or more minutes).  Arriving late affects you and other patients whose appointments are after yours.  Also, if you miss three or more appointments without notifying the office, you may be dismissed from the clinic at the provider's discretion.      For prescription refill requests, have your pharmacy contact our office and allow 72 hours for refills to be completed.    Today you received the following chemotherapy and/or immunotherapy agents: Kyprolis.       To help prevent nausea and vomiting after your treatment, we encourage you to take your nausea medication as directed.  BELOW ARE SYMPTOMS THAT SHOULD BE REPORTED IMMEDIATELY: *FEVER GREATER THAN 100.4 F (38 C) OR HIGHER *CHILLS OR SWEATING *NAUSEA AND VOMITING THAT IS NOT CONTROLLED WITH YOUR NAUSEA MEDICATION *UNUSUAL SHORTNESS OF BREATH *UNUSUAL BRUISING OR BLEEDING *URINARY PROBLEMS (pain or burning when urinating, or frequent urination) *BOWEL PROBLEMS (unusual diarrhea, constipation, pain near the anus) TENDERNESS IN MOUTH AND THROAT WITH OR WITHOUT PRESENCE OF ULCERS (sore throat, sores in mouth, or a toothache) UNUSUAL RASH, SWELLING OR PAIN  UNUSUAL VAGINAL DISCHARGE OR ITCHING   Items with * indicate a potential emergency and should be followed up as soon as possible or go to the Emergency Department if any problems should occur.  Please show the CHEMOTHERAPY ALERT CARD or IMMUNOTHERAPY ALERT CARD at  check-in to the Emergency Department and triage nurse.  Should you have questions after your visit or need to cancel or reschedule your appointment, please contact [facility]  Dept: [phone]  and follow the prompts.  Office hours are 8:00 a.m. to 4:30 p.m. Monday - Friday. Please note that voicemails left after 4:00 p.m. may not be returned until the following business day.  We are closed weekends and major holidays. You have access to a nurse at all times for urgent questions. Please call the main number to the clinic Dept: [phone] and follow the prompts.   For any non-urgent questions, you may also contact your provider using MyChart. We now offer e-Visits for anyone 24 and older to request care online for non-urgent symptoms. For details visit [email or web address].   Also download the MyChart app! Go to the app store, search "MyChart", open the app, select [facility], and log in with your MyChart username and password.

## 2022-08-31 NOTE — Assessment & Plan Note (Signed)
She felt better since she had received blood transfusion support She will resume Cytoxan Repeat myeloma panel from today is pending, we will call her with test results We will continue her treatment as scheduled

## 2022-08-31 NOTE — Progress Notes (Signed)
[place] OFFICE PROGRESS NOTE  Patient Care Team: Default, Provider, MD as PCP - General Fletcher Anon Mertie Clause, MD as PCP - Cardiology (Cardiology) [name], MD as Referring Physician (Hematology and Oncology) [name], MD as Consulting Physician (Internal Medicine) [name], [name], MD as Consulting Physician (Infectious Diseases) [name], MD as Consulting Physician (Hematology and Oncology) [name], NP as Nurse Practitioner (Hematology and Oncology) [name], MD as Consulting Physician (Hematology and Oncology)  ASSESSMENT & PLAN:  Multiple myeloma in relapse [facility]) She felt better since she had received blood transfusion support She will resume Cytoxan Repeat myeloma panel from today is pending, we will call her with test results We will continue her treatment as scheduled  Pancytopenia, acquired ([place]) Her blood count has improved with transfusion support We will proceed with treatment without delay  No orders of the defined types were placed in this encounter.   All questions were answered. The patient knows to call the clinic with any problems, questions or concerns. The total time spent in the appointment was 20 minutes encounter with patients including review of chart and various tests results, discussions about plan of care and coordination of care plan   [name], MD 08/31/2022 10:26 AM  INTERVAL HISTORY: Please see below for problem oriented charting. she returns for treatment follow-up seen prior to chemotherapy today She felt better after blood transfusion last week No recent bleeding Appetite is stable She is continuing on physical therapy Denies bone pain  REVIEW OF SYSTEMS:   Constitutional: Denies fevers, chills or abnormal weight loss Eyes: Denies blurriness of vision Ears, nose, mouth, throat, and face: Denies mucositis or sore throat Respiratory: Denies cough, dyspnea or  wheezes Cardiovascular: Denies palpitation, chest discomfort or lower extremity swelling Gastrointestinal:  Denies nausea, heartburn or change in bowel habits Skin: Denies abnormal skin rashes Lymphatics: Denies new lymphadenopathy or easy bruising Neurological:Denies numbness, tingling or new weaknesses Behavioral/Psych: Mood is stable, no new changes  All other systems were reviewed with the patient and are negative.  I have reviewed the past medical history, past surgical history, social history and family history with the patient and they are unchanged from previous note.  ALLERGIES:  has No Known Allergies.  MEDICATIONS:  Current Outpatient Medications  Medication Sig Dispense Refill   acyclovir (ZOVIRAX) 400 MG tablet Take 1 tablet (400 mg total) by mouth 2 (two) times daily. 30 tablet 0   aspirin 325 MG tablet Take 325 mg by mouth daily.     calcium carbonate (TUMS - DOSED IN MG ELEMENTAL CALCIUM) 500 MG chewable tablet Chew 1 tablet by mouth daily.     Carfilzomib (KYPROLIS IV) Inject into the vein once a week.     Cholecalciferol 25 MCG (1000 UT) tablet Take 1,000 Units by mouth daily.     Cyanocobalamin (VITAMIN B12 PO) Take 1 tablet by mouth daily.     cyclophosphamide (CYTOXAN) 50 MG capsule Take 5 capsules (250 mg total) by mouth once a week. Take with food to minimize GI upset. Take early in the day and maintain hydration. 20 capsule 11   dexamethasone (DECADRON) 4 MG tablet Take 1 tablet (4 mg total) by mouth once a week. Take 1 tablet by mouth 1 hour before chemotherapy 20 tablet 5   loperamide (IMODIUM) 2 MG capsule Take by mouth as needed for diarrhea or loose stools. As needed only     metoprolol tartrate (LOPRESSOR) 25 MG tablet TAKE 1  AND 1/2 TABLETS BY MOUTH TWICE DAILY 90 tablet 0   mirtazapine (REMERON) 7.5 MG tablet Take 1 tablet (7.5 mg total) by mouth at bedtime. 90 tablet 3   Multiple Vitamin (MULTIVITAMIN WITH MINERALS) TABS tablet Take 1 tablet by mouth daily.      Multiple Vitamins-Minerals (PRESERVISION AREDS 2 PO) Take 1 tablet by mouth 2 (two) times daily.     ondansetron (ZOFRAN) 8 MG tablet Take 1 tablet (8 mg total) by mouth every 8 (eight) hours as needed (Nausea or vomiting). 30 tablet 1   pantoprazole (PROTONIX) 40 MG tablet TAKE 1 TABLET BY MOUTH 2 TIMES DAILY. TAKE 30-60 MINUTES BEFORE BREAKFAST AND DINNER. 180 tablet 2   No current facility-administered medications for this visit.    SUMMARY OF ONCOLOGIC HISTORY: Oncology History Overview Note  Multiple myeloma, Ig A Lambda, M spike 3.54 grams, Calcium 9.2, Creatinine 0.8, Beta 2 microglobulin 4.52, IgA 4840 mg/dL, lambda light chain 75.4, albumin 3.6, hemoglobin 9.7, platelet 115    Primary site: Multiple Myeloma   Staging method: AJCC 6th Edition   Clinical: Stage IIA signed by [name], MD on 11/07/2013  2:46 PM   Summary: Stage IIA S/p Allo transplant at [facility] on revlimid, pomalyst, daratumumab and velcade     Multiple myeloma in relapse ([place])  10/31/2013 Bone Marrow Biopsy   Bone marrow biopsy confirmed multiple myeloma with 40% bone marrow involvement. Skeletal survey showed minimal lesions in her score with generalized demineralization   11/10/2013 - 02/13/2014 Chemotherapy   The patient is started on induction chemotherapy with weekly dexamethasone 40 mg by mouth as well as Velcade subcutaneous injection on days 1, 4, 8 and 11. On 11/21/2013, she was started on monthly Zometa.   12/23/2013 Adverse Reaction   The dose of Velcade was reduced due to thrombocytopenia.   01/28/2014 - 04/07/2014 Chemotherapy   Revlimid is added. Treatment was discontinued due to lack of response.   02/24/2014 - 04/07/2014 Chemotherapy   Due to worsening peripheral neuropathy, Velcade injection is changed to once a week. Revlimid was given 21 days on, 7 days off.   04/07/2014 - 04/10/2014 Chemotherapy   Revlimid was discontinued due to lack of response. Chemotherapy was changed back to  Velcade injection twice a week, 2 weeks on 1 week off. Her treatment was switched to to minimum response   04/20/2014 - 06/02/2014 Chemotherapy   chemotherapy is switched to Carfilzomib, Cytoxan and dexamethasone.   04/22/2014 Procedure   she has placement of port for chemotherapy.   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 at [facility]   10/20/2014 - 03/24/2015 Chemotherapy    she received chemotherapy with Kyprolis, Revlimid and dexamethasone   10/22/2014 Procedure   She has port placement   01/19/2015 Tumor Marker   IgA lambda M spike at 0.4 g    01/20/2015 Miscellaneous   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 No cource was found and fever resolved with IV vancomycin and meropenem   09/13/2015 Bone Marrow Biopsy   Bone marrow biopsy showed no increased blasts, 3-4 % plasma cells   03/02/2016 Bone Marrow Biopsy   Bone marrow biopsy at [facility] showed normocellular (30%) bone marrow with trilineage hematopoiesis. No significant increase in blasts. No significant increase in plasma cells.   05/12/2016 Imaging   DEXA scan at [place] showed osteopenia   10/24/2016  Imaging   Skeletal survey at [facility], no new lesions   12/07/2017 Imaging   No focal abnormality noted to suggest myeloma. Exam is stable from prior exam.   03/01/2018 Procedure   Successful 8 French right internal jugular vein power port placement with its tip at the SVC/RA junction.   03/06/2018 -  Chemotherapy   The patient had daratumumab    07/09/2018 Bone Marrow Biopsy   Bone marrow biopsy at [facility] showed residual disease at 0.004% plasma cells   07/03/2019 Bone Marrow Biopsy   A. Bone marrow, flow cytometric analysis for multiple myeloma minimal residual disease detection:   Negative. No phenotypically abnormal plasma cells at or above the limit of detection identified.     No monotypic B-cell population identified. Negative for increased blasts.   03/25/2021 Bone Marrow Biopsy   Repeat bone marrow biopsy at [place] showed female donor karyotype.  5 to 7% plasma cell is seen.  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   04/20/2021 Echocardiogram   1. Left ventricular ejection fraction, by estimation, is 60 to 65%. The left ventricle has normal function. The left ventricle has no regional wall motion abnormalities. Left ventricular diastolic parameters were normal.  2. Right ventricular systolic function is normal. The right ventricular size is normal.  3. The mitral valve is normal in structure. No evidence of mitral valve regurgitation. No evidence of mitral stenosis.  4. The aortic valve is normal in structure. Aortic valve regurgitation is not visualized. No aortic stenosis is present.  5. The inferior vena cava is normal in size with greater than 50% respiratory variability, suggesting right atrial pressure of 3 mmHg.   04/29/2021 - 02/17/2022 Chemotherapy   Patient is on Treatment Plan : MYELOMA RELAPSED/ REFRACTORY Carfilzomib  D1,8,15 (20/27) + Dexamethasone (KPd) q28d     04/30/2021 -  Chemotherapy   Patient is on Treatment Plan : MYELOMA RELAPSED/REFRACTORY Carfilzomib (20/70) D1,8,15 + Dexamethasone weekly (40) (Kd) q28d  x 9 cycles / Dexamethasone D1,8,15     05/04/2021 PET scan   1. No evidence active multiple myeloma within the skeleton on FDG PET scan. 2. No suspicious lytic lesion on CT portion exam. 3. No plasmacytoma.   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   10/31/2015 Miscellaneous   She has engrafted     PHYSICAL EXAMINATION: ECOG PERFORMANCE STATUS: 1 - Symptomatic but completely ambulatory  Vitals:   08/31/22 0959  BP: (!) 161/68  Pulse: 84  Resp: 17  Temp: 98.2 F (36.8 C)  SpO2: 100%   Filed Weights   08/31/22 0959  Weight: 154 lb 6.4 oz (70 kg)    GENERAL:alert, no distress and comfortable NEURO: alert & oriented x 3 with fluent speech, no focal motor/sensory deficits  LABORATORY DATA:  I have reviewed the data as listed    Component Value Date/Time   NA 140 08/31/2022 0936   NA 142 01/04/2017 0902   K 3.9 08/31/2022 0936   K 3.9 01/04/2017 0902   CL 103 08/31/2022 0936   CO2 29 08/31/2022 0936   CO2 28 01/04/2017 0902   GLUCOSE 101 (H) 08/31/2022 0936   GLUCOSE 92 01/04/2017 0902   BUN 16 08/31/2022 0936   BUN 9.5 01/04/2017 0902   CREATININE 0.66 08/31/2022 0936    CREATININE 0.7 01/04/2017 0902   CALCIUM 9.2 08/31/2022 0936   CALCIUM 9.0 01/04/2017 0902   PROT 7.6 08/31/2022 0936   PROT 6.0 (L) 01/04/2017 0902   PROT 5.8 (L) 01/04/2017 0902   ALBUMIN 3.7 08/31/2022 0936   ALBUMIN 3.4 (L) 01/04/2017 0902   AST 10 (L) 08/31/2022 0936   AST 21 01/04/2017 0902   ALT 6 08/31/2022 0936   ALT 16 01/04/2017 0902   ALKPHOS 86 08/31/2022 0936   ALKPHOS 101 01/04/2017 0902   BILITOT 0.3 08/31/2022 0936   BILITOT 0.56 01/04/2017 0902   GFRNONAA >60 08/31/2022 0936   GFRAA >60 03/25/2020 0917   GFRAA >60 12/18/2018 0901    No results found for: "SPEP", "UPEP"  Lab Results  Component Value Date   WBC 5.2 08/31/2022   NEUTROABS 3.7 08/31/2022   HGB 9.2 (L) 08/31/2022   HCT 29.0 (L) 08/31/2022   MCV 96.3 08/31/2022   PLT 279 08/31/2022      Chemistry      Component Value Date/Time   NA 140 08/31/2022 0936   NA 142 01/04/2017 0902   K 3.9 08/31/2022 0936   K 3.9 01/04/2017 0902   CL 103 08/31/2022 0936   CO2 29 08/31/2022 0936   CO2 28 01/04/2017 0902   BUN 16 08/31/2022 0936   BUN 9.5 01/04/2017 0902   CREATININE 0.66 08/31/2022 0936   CREATININE 0.7 01/04/2017 0902      Component Value Date/Time   CALCIUM 9.2 08/31/2022 0936   CALCIUM 9.0 01/04/2017 0902   ALKPHOS 86 08/31/2022 0936   ALKPHOS 101 01/04/2017 0902   AST 10 (L) 08/31/2022 0936   AST 21 01/04/2017 0902   ALT 6 08/31/2022 0936   ALT 16 01/04/2017 0902   BILITOT 0.3 08/31/2022 0936   BILITOT 0.56 01/04/2017 0902

## 2022-08-31 NOTE — Assessment & Plan Note (Signed)
Her blood count has improved with transfusion support We will proceed with treatment without delay

## 2022-09-01 LAB — KAPPA/LAMBDA LIGHT CHAINS
Kappa free light chain: 6 mg/L (ref 3.3–19.4)
Kappa, lambda light chain ratio: 0.02 — ABNORMAL LOW (ref 0.26–1.65)
Lambda free light chains: 400 mg/L — ABNORMAL HIGH (ref 5.7–26.3)

## 2022-09-04 ENCOUNTER — Telehealth: Payer: Self-pay

## 2022-09-04 ENCOUNTER — Telehealth: Payer: Self-pay | Admitting: Hematology and Oncology

## 2022-09-04 NOTE — Telephone Encounter (Signed)
Returned her call. She is requesting to move 4/5 appts to 4/4. Will send a scheduling message to reschedule appts.

## 2022-09-04 NOTE — Telephone Encounter (Signed)
Spoke with patient confirming upcoming appointments change

## 2022-09-05 LAB — MULTIPLE MYELOMA PANEL, SERUM
Albumin SerPl Elph-Mcnc: 3.2 g/dL (ref 2.9–4.4)
Albumin/Glob SerPl: 0.9 (ref 0.7–1.7)
Alpha 1: 0.3 g/dL (ref 0.0–0.4)
Alpha2 Glob SerPl Elph-Mcnc: 1.1 g/dL — ABNORMAL HIGH (ref 0.4–1.0)
B-Globulin SerPl Elph-Mcnc: 0.8 g/dL (ref 0.7–1.3)
Gamma Glob SerPl Elph-Mcnc: 1.5 g/dL (ref 0.4–1.8)
Globulin, Total: 3.7 g/dL (ref 2.2–3.9)
IgA: 1096 mg/dL — ABNORMAL HIGH (ref 64–422)
IgG (Immunoglobin G), Serum: 452 mg/dL — ABNORMAL LOW (ref 586–1602)
IgM (Immunoglobulin M), Srm: 15 mg/dL — ABNORMAL LOW (ref 26–217)
M Protein SerPl Elph-Mcnc: 0.8 g/dL — ABNORMAL HIGH
Total Protein ELP: 6.9 g/dL (ref 6.0–8.5)

## 2022-09-07 ENCOUNTER — Telehealth: Payer: Self-pay

## 2022-09-07 NOTE — Telephone Encounter (Signed)
-----   Message from [name], MD sent at 09/07/2022 10:55 AM EDT ----- Tell her M protein is better

## 2022-09-07 NOTE — Telephone Encounter (Signed)
Called and given below message. She verbalized understanding and appreciated the call. 

## 2022-09-08 ENCOUNTER — Inpatient Hospital Stay: Payer: Medicare HMO

## 2022-09-08 VITALS — BP 143/66 | HR 76 | Temp 98.3°F | Resp 18 | Wt 153.0 lb

## 2022-09-08 DIAGNOSIS — C9002 Multiple myeloma in relapse: Secondary | ICD-10-CM

## 2022-09-08 DIAGNOSIS — D469 Myelodysplastic syndrome, unspecified: Secondary | ICD-10-CM

## 2022-09-08 DIAGNOSIS — D61818 Other pancytopenia: Secondary | ICD-10-CM

## 2022-09-08 DIAGNOSIS — Z5111 Encounter for antineoplastic chemotherapy: Secondary | ICD-10-CM | POA: Diagnosis not present

## 2022-09-08 DIAGNOSIS — D6481 Anemia due to antineoplastic chemotherapy: Secondary | ICD-10-CM

## 2022-09-08 LAB — CMP (CANCER CENTER ONLY)
ALT: 6 U/L (ref 0–44)
AST: 10 U/L — ABNORMAL LOW (ref 15–41)
Albumin: 3.6 g/dL (ref 3.5–5.0)
Alkaline Phosphatase: 81 U/L (ref 38–126)
Anion gap: 7 (ref 5–15)
BUN: 16 mg/dL (ref 8–23)
CO2: 29 mmol/L (ref 22–32)
Calcium: 9 mg/dL (ref 8.9–10.3)
Chloride: 102 mmol/L (ref 98–111)
Creatinine: 0.63 mg/dL (ref 0.44–1.00)
GFR, Estimated: >60 mL/min (ref >60)
Glucose, Bld: 105 mg/dL — ABNORMAL HIGH (ref 70–99)
Potassium: 4 mmol/L (ref 3.5–5.1)
Sodium: 138 mmol/L (ref 135–145)
Total Bilirubin: 0.3 mg/dL (ref 0.3–1.2)
Total Protein: 7.4 g/dL (ref 6.5–8.1)

## 2022-09-08 LAB — SAMPLE TO BLOOD BANK

## 2022-09-08 LAB — CBC WITH DIFFERENTIAL (CANCER CENTER ONLY)
Abs Immature Granulocytes: 0.01 10*3/uL (ref 0.00–0.07)
Basophils Absolute: 0 10*3/uL (ref 0.0–0.1)
Basophils Relative: 0 %
Eosinophils Absolute: 0 10*3/uL (ref 0.0–0.5)
Eosinophils Relative: 0 %
HCT: 28.7 % — ABNORMAL LOW (ref 36.0–46.0)
Hemoglobin: 8.8 g/dL — ABNORMAL LOW (ref 12.0–15.0)
Immature Granulocytes: 0 %
Lymphocytes Relative: 13 %
Lymphs Abs: 0.7 10*3/uL (ref 0.7–4.0)
MCH: 30 pg (ref 26.0–34.0)
MCHC: 30.7 g/dL (ref 30.0–36.0)
MCV: 98 fL (ref 80.0–100.0)
Monocytes Absolute: 0.7 10*3/uL (ref 0.1–1.0)
Monocytes Relative: 13 %
Neutro Abs: 3.7 10*3/uL (ref 1.7–7.7)
Neutrophils Relative %: 74 %
Platelet Count: 256 10*3/uL (ref 150–400)
RBC: 2.93 MIL/uL — ABNORMAL LOW (ref 3.87–5.11)
RDW: 17.6 % — ABNORMAL HIGH (ref 11.5–15.5)
WBC Count: 5.1 10*3/uL (ref 4.0–10.5)
nRBC: 0 % (ref 0.0–0.2)

## 2022-09-08 MED ORDER — SODIUM CHLORIDE 0.9% FLUSH
10.0000 mL | INTRAVENOUS | Status: DC | PRN
  Administered 2022-09-08: 10 mL

## 2022-09-08 MED ORDER — HEPARIN SOD (PORK) LOCK FLUSH 100 UNIT/ML IV SOLN
500.0000 [IU] | Freq: Once | INTRAVENOUS | Status: AC | PRN
  Administered 2022-09-08: 500 [IU]

## 2022-09-08 MED ORDER — SODIUM CHLORIDE 0.9% FLUSH
10.0000 mL | Freq: Once | INTRAVENOUS | Status: AC
  Administered 2022-09-08: 10 mL

## 2022-09-08 MED ORDER — DEXTROSE 5 % IV SOLN
34.0000 mg/m2 | Freq: Once | INTRAVENOUS | Status: AC
  Administered 2022-09-08: 60 mg via INTRAVENOUS
  Filled 2022-09-08: qty 30

## 2022-09-08 MED ORDER — SODIUM CHLORIDE 0.9 % IV SOLN: Freq: Once | INTRAVENOUS | Status: DC

## 2022-09-08 MED ORDER — SODIUM CHLORIDE 0.9 % IV SOLN: Freq: Once | INTRAVENOUS | Status: AC

## 2022-09-08 NOTE — Patient Instructions (Signed)
[facility]  Discharge Instructions: Thank you for choosing [facility] to provide your oncology and hematology care.   If you have a lab appointment with the Cancer Center, please go directly to the Cancer Center and check in at the registration area.   Wear comfortable clothing and clothing appropriate for easy access to any Portacath or PICC line.   We strive to give you quality time with your provider. You may need to reschedule your appointment if you arrive late (15 or more minutes).  Arriving late affects you and other patients whose appointments are after yours.  Also, if you miss three or more appointments without notifying the office, you may be dismissed from the clinic at the provider's discretion.      For prescription refill requests, have your pharmacy contact our office and allow 72 hours for refills to be completed.    Today you received the following chemotherapy and/or immunotherapy agents :  Kyprolis   To help prevent nausea and vomiting after your treatment, we encourage you to take your nausea medication as directed.  BELOW ARE SYMPTOMS THAT SHOULD BE REPORTED IMMEDIATELY: *FEVER GREATER THAN 100.4 F (38 C) OR HIGHER *CHILLS OR SWEATING *NAUSEA AND VOMITING THAT IS NOT CONTROLLED WITH YOUR NAUSEA MEDICATION *UNUSUAL SHORTNESS OF BREATH *UNUSUAL BRUISING OR BLEEDING *URINARY PROBLEMS (pain or burning when urinating, or frequent urination) *BOWEL PROBLEMS (unusual diarrhea, constipation, pain near the anus) TENDERNESS IN MOUTH AND THROAT WITH OR WITHOUT PRESENCE OF ULCERS (sore throat, sores in mouth, or a toothache) UNUSUAL RASH, SWELLING OR PAIN  UNUSUAL VAGINAL DISCHARGE OR ITCHING   Items with * indicate a potential emergency and should be followed up as soon as possible or go to the Emergency Department if any problems should occur.  Please show the CHEMOTHERAPY ALERT CARD or IMMUNOTHERAPY ALERT CARD at  check-in to the Emergency Department and triage nurse.  Should you have questions after your visit or need to cancel or reschedule your appointment, please contact [facility]  Dept: [phone]  and follow the prompts.  Office hours are 8:00 a.m. to 4:30 p.m. Monday - Friday. Please note that voicemails left after 4:00 p.m. may not be returned until the following business day.  We are closed weekends and major holidays. You have access to a nurse at all times for urgent questions. Please call the main number to the clinic Dept: [phone] and follow the prompts.   For any non-urgent questions, you may also contact your provider using MyChart. We now offer e-Visits for anyone 18 and older to request care online for non-urgent symptoms. For details visit [email or web address].   Also download the MyChart app! Go to the app store, search "MyChart", open the app, select [place], and log in with your MyChart username and password.   

## 2022-09-15 ENCOUNTER — Inpatient Hospital Stay: Payer: Medicare HMO

## 2022-09-15 VITALS — BP 138/66 | HR 72 | Temp 98.2°F | Resp 18 | Wt 154.4 lb

## 2022-09-15 DIAGNOSIS — D469 Myelodysplastic syndrome, unspecified: Secondary | ICD-10-CM

## 2022-09-15 DIAGNOSIS — C9002 Multiple myeloma in relapse: Secondary | ICD-10-CM

## 2022-09-15 DIAGNOSIS — Z5111 Encounter for antineoplastic chemotherapy: Secondary | ICD-10-CM | POA: Diagnosis not present

## 2022-09-15 LAB — CMP (CANCER CENTER ONLY)
ALT: 6 U/L (ref 0–44)
AST: 9 U/L — ABNORMAL LOW (ref 15–41)
Albumin: 3.5 g/dL (ref 3.5–5.0)
Alkaline Phosphatase: 82 U/L (ref 38–126)
Anion gap: 6 (ref 5–15)
BUN: 18 mg/dL (ref 8–23)
CO2: 28 mmol/L (ref 22–32)
Calcium: 8.7 mg/dL — ABNORMAL LOW (ref 8.9–10.3)
Chloride: 107 mmol/L (ref 98–111)
Creatinine: 0.62 mg/dL (ref 0.44–1.00)
GFR, Estimated: >60 mL/min (ref >60)
Glucose, Bld: 129 mg/dL — ABNORMAL HIGH (ref 70–99)
Potassium: 3.7 mmol/L (ref 3.5–5.1)
Sodium: 141 mmol/L (ref 135–145)
Total Bilirubin: 0.3 mg/dL (ref 0.3–1.2)
Total Protein: 7 g/dL (ref 6.5–8.1)

## 2022-09-15 LAB — CBC WITH DIFFERENTIAL (CANCER CENTER ONLY)
Abs Immature Granulocytes: 0.02 10*3/uL (ref 0.00–0.07)
Basophils Absolute: 0 10*3/uL (ref 0.0–0.1)
Basophils Relative: 0 %
Eosinophils Absolute: 0 10*3/uL (ref 0.0–0.5)
Eosinophils Relative: 0 %
HCT: 27.1 % — ABNORMAL LOW (ref 36.0–46.0)
Hemoglobin: 8.5 g/dL — ABNORMAL LOW (ref 12.0–15.0)
Immature Granulocytes: 0 %
Lymphocytes Relative: 11 %
Lymphs Abs: 0.6 10*3/uL — ABNORMAL LOW (ref 0.7–4.0)
MCH: 31 pg (ref 26.0–34.0)
MCHC: 31.4 g/dL (ref 30.0–36.0)
MCV: 98.9 fL (ref 80.0–100.0)
Monocytes Absolute: 0.6 10*3/uL (ref 0.1–1.0)
Monocytes Relative: 10 %
Neutro Abs: 4.4 10*3/uL (ref 1.7–7.7)
Neutrophils Relative %: 79 %
Platelet Count: 225 10*3/uL (ref 150–400)
RBC: 2.74 MIL/uL — ABNORMAL LOW (ref 3.87–5.11)
RDW: 17.8 % — ABNORMAL HIGH (ref 11.5–15.5)
WBC Count: 5.5 10*3/uL (ref 4.0–10.5)
nRBC: 0 % (ref 0.0–0.2)

## 2022-09-15 MED ORDER — SODIUM CHLORIDE 0.9% FLUSH
10.0000 mL | Freq: Once | INTRAVENOUS | Status: AC
  Administered 2022-09-15: 10 mL

## 2022-09-15 MED ORDER — DEXTROSE 5 % IV SOLN
34.0000 mg/m2 | Freq: Once | INTRAVENOUS | Status: AC
  Administered 2022-09-15: 60 mg via INTRAVENOUS
  Filled 2022-09-15: qty 30

## 2022-09-15 MED ORDER — SODIUM CHLORIDE 0.9 % IV SOLN: Freq: Once | INTRAVENOUS | Status: AC

## 2022-09-15 NOTE — Progress Notes (Signed)
Patient took steroid at home before treatment.

## 2022-09-25 ENCOUNTER — Telehealth: Payer: Self-pay

## 2022-09-25 NOTE — Telephone Encounter (Signed)
Returned call the [facility] and given office fax # to fax progress note with CAR-T appts at [facility].

## 2022-09-26 ENCOUNTER — Encounter: Payer: Self-pay | Admitting: Hematology and Oncology

## 2022-09-26 ENCOUNTER — Inpatient Hospital Stay: Payer: Medicare HMO | Admitting: Hematology and Oncology

## 2022-09-26 ENCOUNTER — Inpatient Hospital Stay: Payer: Medicare HMO | Attending: Hematology and Oncology | Admitting: Hematology and Oncology

## 2022-09-26 ENCOUNTER — Inpatient Hospital Stay: Payer: Medicare HMO

## 2022-09-26 ENCOUNTER — Other Ambulatory Visit: Payer: Self-pay

## 2022-09-26 VITALS — BP 129/88 | HR 85 | Temp 98.4°F | Resp 18 | Ht 62.0 in | Wt 152.0 lb

## 2022-09-26 DIAGNOSIS — D6481 Anemia due to antineoplastic chemotherapy: Secondary | ICD-10-CM | POA: Diagnosis not present

## 2022-09-26 DIAGNOSIS — C9002 Multiple myeloma in relapse: Secondary | ICD-10-CM | POA: Diagnosis not present

## 2022-09-26 DIAGNOSIS — T451X5A Adverse effect of antineoplastic and immunosuppressive drugs, initial encounter: Secondary | ICD-10-CM

## 2022-09-26 DIAGNOSIS — D469 Myelodysplastic syndrome, unspecified: Secondary | ICD-10-CM

## 2022-09-26 DIAGNOSIS — G620 Drug-induced polyneuropathy: Secondary | ICD-10-CM | POA: Diagnosis not present

## 2022-09-26 DIAGNOSIS — D61818 Other pancytopenia: Secondary | ICD-10-CM

## 2022-09-26 DIAGNOSIS — Z5111 Encounter for antineoplastic chemotherapy: Secondary | ICD-10-CM | POA: Insufficient documentation

## 2022-09-26 LAB — CMP (CANCER CENTER ONLY)
ALT: 6 U/L (ref 0–44)
AST: 9 U/L — ABNORMAL LOW (ref 15–41)
Albumin: 3.7 g/dL (ref 3.5–5.0)
Alkaline Phosphatase: 83 U/L (ref 38–126)
Anion gap: 9 (ref 5–15)
BUN: 14 mg/dL (ref 8–23)
CO2: 28 mmol/L (ref 22–32)
Calcium: 9.4 mg/dL (ref 8.9–10.3)
Chloride: 103 mmol/L (ref 98–111)
Creatinine: 0.76 mg/dL (ref 0.44–1.00)
GFR, Estimated: >60 mL/min (ref >60)
Glucose, Bld: 114 mg/dL — ABNORMAL HIGH (ref 70–99)
Potassium: 4 mmol/L (ref 3.5–5.1)
Sodium: 140 mmol/L (ref 135–145)
Total Bilirubin: 0.3 mg/dL (ref 0.3–1.2)
Total Protein: 7.7 g/dL (ref 6.5–8.1)

## 2022-09-26 LAB — CBC WITH DIFFERENTIAL (CANCER CENTER ONLY)
Abs Immature Granulocytes: 0.02 10*3/uL (ref 0.00–0.07)
Basophils Absolute: 0 10*3/uL (ref 0.0–0.1)
Basophils Relative: 0 %
Eosinophils Absolute: 0 10*3/uL (ref 0.0–0.5)
Eosinophils Relative: 0 %
HCT: 26.2 % — ABNORMAL LOW (ref 36.0–46.0)
Hemoglobin: 8.3 g/dL — ABNORMAL LOW (ref 12.0–15.0)
Immature Granulocytes: 0 %
Lymphocytes Relative: 13 %
Lymphs Abs: 0.7 10*3/uL (ref 0.7–4.0)
MCH: 31.4 pg (ref 26.0–34.0)
MCHC: 31.7 g/dL (ref 30.0–36.0)
MCV: 99.2 fL (ref 80.0–100.0)
Monocytes Absolute: 0.7 10*3/uL (ref 0.1–1.0)
Monocytes Relative: 13 %
Neutro Abs: 3.9 10*3/uL (ref 1.7–7.7)
Neutrophils Relative %: 74 %
Platelet Count: 282 10*3/uL (ref 150–400)
RBC: 2.64 MIL/uL — ABNORMAL LOW (ref 3.87–5.11)
RDW: 18.2 % — ABNORMAL HIGH (ref 11.5–15.5)
WBC Count: 5.3 10*3/uL (ref 4.0–10.5)
nRBC: 0 % (ref 0.0–0.2)

## 2022-09-26 LAB — SAMPLE TO BLOOD BANK

## 2022-09-26 MED ORDER — HEPARIN SOD (PORK) LOCK FLUSH 100 UNIT/ML IV SOLN
500.0000 [IU] | Freq: Once | INTRAVENOUS | Status: AC
  Administered 2022-09-26: 500 [IU]

## 2022-09-26 MED ORDER — SODIUM CHLORIDE 0.9% FLUSH
10.0000 mL | Freq: Once | INTRAVENOUS | Status: AC
  Administered 2022-09-26: 10 mL

## 2022-09-26 MED ORDER — ONDANSETRON HCL 8 MG PO TABS: 8.0000 mg | ORAL_TABLET | Freq: Three times a day (TID) | ORAL | 1 refills | Status: DC | PRN

## 2022-09-26 NOTE — Progress Notes (Signed)
[facility] OFFICE PROGRESS NOTE  Patient Care Team: Default, Provider, MD as PCP - General [name], MD as PCP - Cardiology (Cardiology) [name], MD as Referring Physician (Hematology and Oncology) [name], MD as Consulting Physician (Internal Medicine) [name], [name], MD as Consulting Physician (Infectious Diseases) [name], MD as Consulting Physician (Hematology and Oncology) [name], NP as Nurse Practitioner (Hematology and Oncology) [name], MD as Consulting Physician (Hematology and Oncology)  ASSESSMENT & PLAN:  Multiple myeloma in relapse [facility]) I have reviewed documentation from [place] She will return next week to begin the process required to get ready for CAR-T cell infusion at the end of the month She will receive her last dose of treatment this week I will see her back at some point in the future after she has achieved stability for further supportive care  Anemia due to antineoplastic chemotherapy Persistent anemia is likely due to side effects of treatment She is somewhat symptomatic but with shortness of breath on exertion She does not need transfusion support Monitor closely  Peripheral neuropathy due to chemotherapy [facility]) She has persistent neuropathy from treatment She has declined treatment with gabapentin  No orders of the defined types were placed in this encounter.   All questions were answered. The patient knows to call the clinic with any problems, questions or concerns. The total time spent in the appointment was 30 minutes encounter with patients including review of chart and various tests results, discussions about plan of care and coordination of care plan   [name], MD 09/26/2022 2:40 PM  INTERVAL HISTORY: Please see below for problem oriented charting. she returns for treatment follow-up and review of plan of care I have reviewed documentation from [place] She  has shortness of breath on exertion Denies recent bleeding She had occasional nausea, well-controlled with antiemetics She complained of pain in both feet when she attempts to walk much We discussed future follow-up and recent test results  REVIEW OF SYSTEMS:   Constitutional: Denies fevers, chills or abnormal weight loss Eyes: Denies blurriness of vision Ears, nose, mouth, throat, and face: Denies mucositis or sore throat Respiratory: Denies cough, dyspnea or wheezes Cardiovascular: Denies palpitation, chest discomfort or lower extremity swelling Gastrointestinal:  Denies nausea, heartburn or change in bowel habits Skin: Denies abnormal skin rashes Lymphatics: Denies new lymphadenopathy or easy bruising Neurological:Denies numbness, tingling or new weaknesses Behavioral/Psych: Mood is stable, no new changes  All other systems were reviewed with the patient and are negative.  I have reviewed the past medical history, past surgical history, social history and family history with the patient and they are unchanged from previous note.  ALLERGIES:  has No Known Allergies.  MEDICATIONS:  Current Outpatient Medications  Medication Sig Dispense Refill   acyclovir (ZOVIRAX) 400 MG tablet Take 1 tablet (400 mg total) by mouth 2 (two) times daily. 30 tablet 0   aspirin 325 MG tablet Take 325 mg by mouth daily.     calcium carbonate (TUMS - DOSED IN MG ELEMENTAL CALCIUM) 500 MG chewable tablet Chew 1 tablet by mouth daily.     Carfilzomib (KYPROLIS IV) Inject into the vein once a week.     Cholecalciferol 25 MCG (1000 UT) tablet Take 1,000 Units by mouth daily.     Cyanocobalamin (VITAMIN B12 PO) Take 1 tablet by mouth daily.     dexamethasone (DECADRON) 4 MG tablet Take 1 tablet (4 mg total) by mouth once a  week. Take 1 tablet by mouth 1 hour before chemotherapy 20 tablet 5   loperamide (IMODIUM) 2 MG capsule Take by mouth as needed for diarrhea or loose stools. As needed only     metoprolol  tartrate (LOPRESSOR) 25 MG tablet TAKE 1 AND 1/2 TABLETS BY MOUTH TWICE DAILY 90 tablet 0   mirtazapine (REMERON) 7.5 MG tablet Take 1 tablet (7.5 mg total) by mouth at bedtime. 90 tablet 3   Multiple Vitamin (MULTIVITAMIN WITH MINERALS) TABS tablet Take 1 tablet by mouth daily.     Multiple Vitamins-Minerals (PRESERVISION AREDS 2 PO) Take 1 tablet by mouth 2 (two) times daily.     ondansetron (ZOFRAN) 8 MG tablet Take 1 tablet (8 mg total) by mouth every 8 (eight) hours as needed (Nausea or vomiting). 60 tablet 1   pantoprazole (PROTONIX) 40 MG tablet TAKE 1 TABLET BY MOUTH 2 TIMES DAILY. TAKE 30-60 MINUTES BEFORE BREAKFAST AND DINNER. 180 tablet 2   No current facility-administered medications for this visit.    SUMMARY OF ONCOLOGIC HISTORY: Oncology History Overview Note  Multiple myeloma, Ig A Lambda, M spike 3.54 grams, Calcium 9.2, Creatinine 0.8, Beta 2 microglobulin 4.52, IgA 4840 mg/dL, lambda light chain 75.4, albumin 3.6, hemoglobin 9.7, platelet 115    Primary site: Multiple Myeloma   Staging method: AJCC 6th Edition   Clinical: Stage IIA signed by [name], MD on 11/07/2013  2:46 PM   Summary: Stage IIA S/p Allo transplant at [facility] on revlimid, pomalyst, daratumumab and velcade     Multiple myeloma in relapse  10/31/2013 Bone Marrow Biopsy   Bone marrow biopsy confirmed multiple myeloma with 40% bone marrow involvement. Skeletal survey showed minimal lesions in her score with generalized demineralization   11/10/2013 - 02/13/2014 Chemotherapy   The patient is started on induction chemotherapy with weekly dexamethasone 40 mg by mouth as well as Velcade subcutaneous injection on days 1, 4, 8 and 11. On 11/21/2013, she was started on monthly Zometa.   12/23/2013 Adverse Reaction   The dose of Velcade was reduced due to thrombocytopenia.   01/28/2014 - 04/07/2014 Chemotherapy   Revlimid is added. Treatment was discontinued due to lack of response.   02/24/2014 -  04/07/2014 Chemotherapy   Due to worsening peripheral neuropathy, Velcade injection is changed to once a week. Revlimid was given 21 days on, 7 days off.   04/07/2014 - 04/10/2014 Chemotherapy   Revlimid was discontinued due to lack of response. Chemotherapy was changed back to Velcade injection twice a week, 2 weeks on 1 week off. Her treatment was switched to to minimum response   04/20/2014 - 06/02/2014 Chemotherapy   chemotherapy is switched to Carfilzomib, Cytoxan and dexamethasone.   04/22/2014 Procedure   she has placement of port for chemotherapy.   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 at [facility]   10/20/2014 - 03/24/2015 Chemotherapy    she received chemotherapy with Kyprolis, Revlimid and dexamethasone   10/22/2014 Procedure   She has port placement   01/19/2015 Tumor Marker   IgA lambda M spike at 0.4 g    01/20/2015  Miscellaneous   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 No cource was found and fever resolved with IV vancomycin and meropenem   09/13/2015 Bone Marrow Biopsy   Bone marrow biopsy showed no increased blasts, 3-4 % plasma cells    03/02/2016 Bone Marrow Biopsy   Bone marrow biopsy at [facility] showed normocellular (30%) bone marrow with trilineage hematopoiesis. No significant increase in blasts. No significant increase in plasma cells.   05/12/2016 Imaging   DEXA scan at [place] showed osteopenia   10/24/2016 Imaging   Skeletal survey at [facility], no new lesions   12/07/2017 Imaging   No focal abnormality noted to suggest myeloma. Exam is stable from prior exam.   03/01/2018 Procedure   Successful 8 French right internal jugular vein power port placement with its tip at the SVC/RA junction.   03/06/2018 -  Chemotherapy   The patient had daratumumab    07/09/2018 Bone Marrow Biopsy   Bone marrow biopsy at [facility] showed residual disease at 0.004% plasma cells   07/03/2019 Bone Marrow Biopsy   A. Bone marrow, flow cytometric analysis for multiple myeloma minimal residual disease detection:   Negative. No phenotypically abnormal plasma cells at or above the limit of detection identified.     No monotypic B-cell population identified. Negative for increased blasts.   03/25/2021 Bone Marrow Biopsy   Repeat bone marrow biopsy at [place] showed female donor karyotype.  5 to 7% plasma cell is seen.  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   04/20/2021 Echocardiogram   1. Left ventricular ejection fraction, by estimation, is 60 to 65%. The left ventricle has normal function. The left ventricle has no regional wall motion abnormalities. Left ventricular diastolic parameters were normal.  2. Right ventricular systolic function is normal. The right ventricular size is normal.  3. The mitral valve is normal in structure. No evidence of mitral valve regurgitation. No evidence of mitral stenosis.  4. The aortic valve is normal in structure. Aortic valve regurgitation is not visualized.  No aortic stenosis is present.  5. The inferior vena cava is normal in size with greater than 50% respiratory variability, suggesting right atrial pressure of 3 mmHg.   04/29/2021 - 02/17/2022 Chemotherapy   Patient is on Treatment Plan : MYELOMA RELAPSED/ REFRACTORY Carfilzomib D1,8,15 (20/27) + Dexamethasone (KPd) q28d     04/30/2021 -  Chemotherapy   Patient is on Treatment Plan : MYELOMA RELAPSED/REFRACTORY Carfilzomib (20/70) D1,8,15 + Dexamethasone weekly (40) (Kd) q28d  x 9 cycles / Dexamethasone D1,8,15     05/04/2021 PET scan   1. No evidence active multiple myeloma within the skeleton on FDG PET scan. 2. No suspicious lytic lesion on CT portion exam. 3. No plasmacytoma.   MDS/MPN (myelodysplastic/myeloproliferative neoplasms)  04/06/2015 Bone Marrow Biopsy   Accession: [id number] BM biopsy showed RAEB-1   04/06/2015 Tumor Marker   Cytogenetics and FISH for MDS  are within normal limits   10/06/2015 - 10/10/2015 Chemotherapy   She received conditioning chemotherapy with busulfan and melphalan   10/12/2015 Bone Marrow Transplant   She received allogenic stem cell transplant   10/19/2015 Adverse Reaction   She developed posttransplant complication with mucositis, viral infection with rhinovirus, neutropenic fever, bilateral pleural effusion and moderate pericardial effusion and CMV reactivation.   10/31/2015 Miscellaneous   She has engrafted     PHYSICAL EXAMINATION: ECOG PERFORMANCE STATUS: 1 - Symptomatic but completely ambulatory  Vitals:   09/26/22 1311  BP: 129/88  Pulse: 85  Resp: 18  Temp: 98.4 F (36.9 C)  SpO2: 100%   Filed Weights   09/26/22 1311  Weight: 152 lb (68.9 kg)    GENERAL:alert, no distress and comfortable SKIN: skin color, texture, turgor are normal, no rashes or significant lesions EYES: normal, Conjunctiva are pink and non-injected, sclera clear OROPHARYNX:no exudate, no erythema and lips, buccal mucosa, and tongue normal  NECK: supple,  thyroid normal size, non-tender, without nodularity LYMPH:  no palpable lymphadenopathy in the cervical, axillary or inguinal LUNGS: clear to auscultation and percussion with normal breathing effort HEART: regular rate & rhythm and no murmurs and no lower extremity edema ABDOMEN:abdomen soft, non-tender and normal bowel sounds Musculoskeletal:no cyanosis of digits and no clubbing  NEURO: alert & oriented x 3 with fluent speech, no focal motor/sensory deficits  LABORATORY DATA:  I have reviewed the data as listed    Component Value Date/Time   NA 140 09/26/2022 1256   NA 142 01/04/2017 0902   K 4.0 09/26/2022 1256   K 3.9 01/04/2017 0902   CL 103 09/26/2022 1256   CO2 28 09/26/2022 1256   CO2 28 01/04/2017 0902   GLUCOSE 114 (H) 09/26/2022 1256   GLUCOSE 92 01/04/2017 0902   BUN 14 09/26/2022 1256   BUN 9.5 01/04/2017 0902   CREATININE 0.76 09/26/2022 1256   CREATININE 0.7 01/04/2017 0902   CALCIUM 9.4 09/26/2022 1256   CALCIUM 9.0 01/04/2017 0902   PROT 7.7 09/26/2022 1256   PROT 6.0 (L) 01/04/2017 0902   PROT 5.8 (L) 01/04/2017 0902   ALBUMIN 3.7 09/26/2022 1256   ALBUMIN 3.4 (L) 01/04/2017 0902   AST 9 (L) 09/26/2022 1256   AST 21 01/04/2017 0902   ALT 6 09/26/2022 1256   ALT 16 01/04/2017 0902   ALKPHOS 83 09/26/2022 1256   ALKPHOS 101 01/04/2017 0902   BILITOT 0.3 09/26/2022 1256   BILITOT 0.56 01/04/2017 0902   GFRNONAA >60 09/26/2022 1256   GFRAA >60 03/25/2020 0917   GFRAA >60 12/18/2018 0901    No results found for: "SPEP", "UPEP"  Lab Results  Component Value Date   WBC 5.3 09/26/2022   NEUTROABS 3.9 09/26/2022   HGB 8.3 (L) 09/26/2022   HCT 26.2 (L) 09/26/2022   MCV 99.2 09/26/2022   PLT 282 09/26/2022      Chemistry      Component Value Date/Time   NA 140 09/26/2022 1256   NA 142 01/04/2017 0902   K 4.0 09/26/2022 1256   K 3.9 01/04/2017 0902   CL 103 09/26/2022 1256   CO2 28 09/26/2022 1256   CO2 28 01/04/2017 0902   BUN 14 09/26/2022  1256   BUN 9.5 01/04/2017 0902   CREATININE 0.76 09/26/2022 1256   CREATININE 0.7 01/04/2017 0902      Component Value Date/Time   CALCIUM 9.4 09/26/2022 1256   CALCIUM 9.0 01/04/2017 0902   ALKPHOS 83 09/26/2022 1256  ALKPHOS 101 01/04/2017 0902   AST 9 (L) 09/26/2022 1256   AST 21 01/04/2017 0902   ALT 6 09/26/2022 1256   ALT 16 01/04/2017 0902   BILITOT 0.3 09/26/2022 1256   BILITOT 0.56 01/04/2017 0902

## 2022-09-26 NOTE — Assessment & Plan Note (Signed)
Persistent anemia is likely due to side effects of treatment She is somewhat symptomatic but with shortness of breath on exertion She does not need transfusion support Monitor closely 

## 2022-09-26 NOTE — Assessment & Plan Note (Signed)
She has persistent neuropathy from treatment She has declined treatment with gabapentin

## 2022-09-26 NOTE — Assessment & Plan Note (Signed)
I have reviewed documentation from [place] She will return next week to begin the process required to get ready for CAR-T cell infusion at the end of the month She will receive her last dose of treatment this week I will see her back at some point in the future after she has achieved stability for further supportive care

## 2022-09-28 ENCOUNTER — Inpatient Hospital Stay: Payer: Medicare HMO | Admitting: Hematology and Oncology

## 2022-09-28 ENCOUNTER — Inpatient Hospital Stay: Payer: Medicare HMO

## 2022-09-28 VITALS — BP 128/88 | HR 82 | Temp 98.2°F | Resp 18 | Wt 151.2 lb

## 2022-09-28 DIAGNOSIS — Z5111 Encounter for antineoplastic chemotherapy: Secondary | ICD-10-CM | POA: Diagnosis not present

## 2022-09-28 DIAGNOSIS — C9002 Multiple myeloma in relapse: Secondary | ICD-10-CM

## 2022-09-28 MED ORDER — DEXTROSE 5 % IV SOLN
34.0000 mg/m2 | Freq: Once | INTRAVENOUS | Status: AC
  Administered 2022-09-28: 60 mg via INTRAVENOUS
  Filled 2022-09-28: qty 30

## 2022-09-28 MED ORDER — SODIUM CHLORIDE 0.9 % IV SOLN: Freq: Once | INTRAVENOUS | Status: DC

## 2022-09-28 MED ORDER — SODIUM CHLORIDE 0.9 % IV SOLN: Freq: Once | INTRAVENOUS | Status: AC

## 2022-09-29 ENCOUNTER — Inpatient Hospital Stay: Payer: Medicare HMO

## 2022-09-29 ENCOUNTER — Inpatient Hospital Stay: Payer: Medicare HMO | Admitting: Hematology and Oncology

## 2022-09-29 ENCOUNTER — Other Ambulatory Visit: Payer: Self-pay

## 2022-10-05 ENCOUNTER — Other Ambulatory Visit: Payer: Self-pay | Admitting: Internal Medicine

## 2022-10-12 ENCOUNTER — Other Ambulatory Visit: Payer: Self-pay | Admitting: Hematology and Oncology

## 2022-10-12 DIAGNOSIS — C9002 Multiple myeloma in relapse: Secondary | ICD-10-CM

## 2022-10-24 ENCOUNTER — Telehealth: Payer: Self-pay

## 2022-10-24 ENCOUNTER — Other Ambulatory Visit: Payer: Self-pay

## 2022-10-24 NOTE — Telephone Encounter (Signed)
Received call from [facility] regarding pt neeeding HHC and whether Dr Bertis Ruddy will be the MD to sign orders. Attempted to call back to further discuss as pt is currently under the care of [facility] for CAR-T therapy.. Left message for call back.

## 2022-10-24 NOTE — Telephone Encounter (Signed)
I cannot be the attending on file

## 2022-10-30 ENCOUNTER — Encounter: Payer: Self-pay | Admitting: Hematology and Oncology

## 2022-11-01 NOTE — Progress Notes (Signed)
[facility] home [facility] at [phone] regarding faxed orders for Dr. Bertis Ruddy to sign for home health. Told office staff that Dr. Bertis Ruddy will not sign and to reach out to PCP or [facility] to get orders signed.

## 2022-11-03 ENCOUNTER — Other Ambulatory Visit: Payer: Self-pay

## 2022-11-13 ENCOUNTER — Telehealth: Payer: Self-pay

## 2022-11-13 NOTE — Telephone Encounter (Signed)
Called and spoke with Leotis Shames at [facility]. Given below message. She appreciated the call and will get Casady scheduled at [facility] next week.

## 2022-11-13 NOTE — Telephone Encounter (Signed)
Lauren Car-T cell coordinator at [employer]. Jean Davidson is being d/ced today. She will follow up at [facility] on 6/3. She had labs today and is still on Prophylaxis. Her labs today are running a little low, WBC 1.3, Hgb 8.5, Platelets 18 and Neutrophils 04. She is asking if Aresha can come new week for lab check is case she needs blood transfusion?

## 2022-11-13 NOTE — Telephone Encounter (Signed)
Infusion room is full every day next week. At earliest, we can try to bring her in on 5/31 for labs and if she needs tx, she would have to come back on Saturday. Better if she can go to [facility] since they can probably do everything in 1 day

## 2022-11-14 ENCOUNTER — Other Ambulatory Visit: Payer: Self-pay

## 2022-11-24 ENCOUNTER — Other Ambulatory Visit: Payer: Self-pay

## 2022-12-02 ENCOUNTER — Other Ambulatory Visit: Payer: Self-pay

## 2022-12-05 ENCOUNTER — Other Ambulatory Visit: Payer: Self-pay | Admitting: Hematology and Oncology

## 2022-12-05 ENCOUNTER — Telehealth: Payer: Self-pay

## 2022-12-05 DIAGNOSIS — M25561 Pain in right knee: Secondary | ICD-10-CM

## 2022-12-05 DIAGNOSIS — R1031 Right lower quadrant pain: Secondary | ICD-10-CM

## 2022-12-05 NOTE — Telephone Encounter (Signed)
She called and left a message requesting appt with Dr. Bertis Ruddy and xray's. She recuperating well from CAR-T cell except for daily pain to right knee, groin area and leg. The pain is slowing her down. She followed up with [facility] recently and was told to get xray's locally.

## 2022-12-05 NOTE — Telephone Encounter (Signed)
I ordered right hip, femur and knee xray Still have to order hip xray for eval I ordered them for [facility]

## 2022-12-05 NOTE — Telephone Encounter (Signed)
Called and given below message. She verbalized understanding and will go tomorrow to [facility] to get xray's.

## 2022-12-05 NOTE — Telephone Encounter (Signed)
Called back and given below message. The pain in from the knee up to groin area, not the hip area, it is mainly in the quadriceps area.  She would like to do the xray and have Dr. Bertis Ruddy call her with results.

## 2022-12-05 NOTE — Telephone Encounter (Signed)
I cannot add her on to my schedule this week Earliest would be Monday.  Or I can order it and call her with results

## 2022-12-06 ENCOUNTER — Ambulatory Visit (HOSPITAL_COMMUNITY)
Admission: RE | Admit: 2022-12-06 | Discharge: 2022-12-06 | Disposition: A | Discharge status: Home / Self Care | Payer: Medicare HMO | Source: Ambulatory Visit | Attending: Hematology and Oncology | Admitting: Hematology and Oncology

## 2022-12-06 DIAGNOSIS — R1031 Right lower quadrant pain: Secondary | ICD-10-CM | POA: Diagnosis present

## 2022-12-06 DIAGNOSIS — M25561 Pain in right knee: Secondary | ICD-10-CM | POA: Insufficient documentation

## 2022-12-13 ENCOUNTER — Telehealth: Payer: Self-pay

## 2022-12-13 ENCOUNTER — Other Ambulatory Visit: Payer: Self-pay | Admitting: Hematology and Oncology

## 2022-12-13 NOTE — Telephone Encounter (Signed)
-----   Message from Artis Delay, MD sent at 12/13/2022  8:24 AM EDT ----- Hi,  Please let her know X rays are negative for lesions or fractures Her recent leg pain is likely muscular

## 2022-12-13 NOTE — Telephone Encounter (Signed)
Called and given below message, Pt verbalized understanding. Pt requesting lab appt with CBC/CMP and MD appt the week of July 4th. Pt states this will be between her appts with [facility] as she does not want to go more than a month without lab work. Will verify with MD before scheduling, Pt verbalized understanding.

## 2022-12-13 NOTE — Telephone Encounter (Signed)
Appts OK per MD (see previous phone note). Pt states she no longer has a port and will need lab draw. Lab and MD appts made for 12/29/22. Pt verbalized understanding.

## 2022-12-29 ENCOUNTER — Inpatient Hospital Stay (HOSPITAL_BASED_OUTPATIENT_CLINIC_OR_DEPARTMENT_OTHER): Payer: Medicare HMO | Admitting: Hematology and Oncology

## 2022-12-29 ENCOUNTER — Encounter: Payer: Self-pay | Admitting: Hematology and Oncology

## 2022-12-29 ENCOUNTER — Inpatient Hospital Stay: Payer: Medicare HMO | Attending: Hematology and Oncology

## 2022-12-29 ENCOUNTER — Other Ambulatory Visit: Payer: Self-pay

## 2022-12-29 VITALS — BP 134/70 | HR 65 | Temp 98.6°F | Resp 17 | Wt 148.3 lb

## 2022-12-29 DIAGNOSIS — R5381 Other malaise: Secondary | ICD-10-CM | POA: Diagnosis not present

## 2022-12-29 DIAGNOSIS — C9002 Multiple myeloma in relapse: Secondary | ICD-10-CM | POA: Diagnosis present

## 2022-12-29 DIAGNOSIS — Z9484 Stem cells transplant status: Secondary | ICD-10-CM | POA: Diagnosis not present

## 2022-12-29 DIAGNOSIS — D61818 Other pancytopenia: Secondary | ICD-10-CM

## 2022-12-29 DIAGNOSIS — M8580 Other specified disorders of bone density and structure, unspecified site: Secondary | ICD-10-CM | POA: Diagnosis not present

## 2022-12-29 DIAGNOSIS — T451X5A Adverse effect of antineoplastic and immunosuppressive drugs, initial encounter: Secondary | ICD-10-CM

## 2022-12-29 LAB — CBC WITH DIFFERENTIAL/PLATELET
Abs Immature Granulocytes: 0 10*3/uL (ref 0.00–0.07)
Basophils Absolute: 0 10*3/uL (ref 0.0–0.1)
Basophils Relative: 0 %
Eosinophils Absolute: 0 10*3/uL (ref 0.0–0.5)
Eosinophils Relative: 0 %
HCT: 28.4 % — ABNORMAL LOW (ref 36.0–46.0)
Hemoglobin: 9.5 g/dL — ABNORMAL LOW (ref 12.0–15.0)
Immature Granulocytes: 0 %
Lymphocytes Relative: 37 %
Lymphs Abs: 0.6 10*3/uL — ABNORMAL LOW (ref 0.7–4.0)
MCH: 39.3 pg — ABNORMAL HIGH (ref 26.0–34.0)
MCHC: 33.5 g/dL (ref 30.0–36.0)
MCV: 117.4 fL — ABNORMAL HIGH (ref 80.0–100.0)
Monocytes Absolute: 0.3 10*3/uL (ref 0.1–1.0)
Monocytes Relative: 18 %
Neutro Abs: 0.7 10*3/uL — ABNORMAL LOW (ref 1.7–7.7)
Neutrophils Relative %: 45 %
Platelets: 53 10*3/uL — ABNORMAL LOW (ref 150–400)
RBC: 2.42 MIL/uL — ABNORMAL LOW (ref 3.87–5.11)
RDW: 19.7 % — ABNORMAL HIGH (ref 11.5–15.5)
WBC: 1.5 10*3/uL — ABNORMAL LOW (ref 4.0–10.5)
nRBC: 0 % (ref 0.0–0.2)

## 2022-12-29 LAB — COMPREHENSIVE METABOLIC PANEL
ALT: 10 U/L (ref 0–44)
AST: 16 U/L (ref 15–41)
Albumin: 3.9 g/dL (ref 3.5–5.0)
Alkaline Phosphatase: 56 U/L (ref 38–126)
Anion gap: 7 (ref 5–15)
BUN: 15 mg/dL (ref 8–23)
CO2: 26 mmol/L (ref 22–32)
Calcium: 8.9 mg/dL (ref 8.9–10.3)
Chloride: 110 mmol/L (ref 98–111)
Creatinine, Ser: 0.8 mg/dL (ref 0.44–1.00)
GFR, Estimated: >60 mL/min (ref >60)
Glucose, Bld: 109 mg/dL — ABNORMAL HIGH (ref 70–99)
Potassium: 4.1 mmol/L (ref 3.5–5.1)
Sodium: 143 mmol/L (ref 135–145)
Total Bilirubin: 0.3 mg/dL (ref 0.3–1.2)
Total Protein: 5.1 g/dL — ABNORMAL LOW (ref 6.5–8.1)

## 2022-12-29 LAB — SAMPLE TO BLOOD BANK

## 2022-12-29 NOTE — Assessment & Plan Note (Signed)
She is still in the recovery phase after recent treatment at [facility] She remained pancytopenic but I am hopeful it will continue to improve in the near future I will defer to her hematologist at [facility] to dictate her care She will return to see me in the future if needed

## 2022-12-29 NOTE — Assessment & Plan Note (Signed)
She has significant physical deconditioning since recent treatment She also have signs of protein malnutrition She will continue physical therapy and home and graduated exercise as tolerated Her recent x-ray of the knee was negative except for degenerative changes

## 2022-12-29 NOTE — Assessment & Plan Note (Signed)
She has acquired pancytopenia due to recent treatment She is not symptomatic and does not need transfusion support She will continue neutropenic precaution along with antimicrobial therapy

## 2022-12-29 NOTE — Progress Notes (Signed)
[facility] OFFICE PROGRESS NOTE  Patient Care Team: Default, Provider, MD as PCP - General Iran Ouch, MD as PCP - Cardiology (Cardiology) Astrid Drafts, MD as Referring Physician (Hematology and Oncology) Eddie Candle, MD as Consulting Physician (Internal Medicine) Daiva Eves, Lisette Grinder, MD as Consulting Physician (Infectious Diseases) Lorriane Shire An, MD as Consulting Physician (Hematology and Oncology) Sadie Haber, NP as Nurse Practitioner (Hematology and Oncology) Artis Delay, MD as Consulting Physician (Hematology and Oncology)  ASSESSMENT & PLAN:  Multiple myeloma in relapse [facility]) She is still in the recovery phase after recent treatment at [facility] She remained pancytopenic but I am hopeful it will continue to improve in the near future I will defer to her hematologist at [facility] to dictate her care She will return to see me in the future if needed  Pancytopenia, acquired [facility]) She has acquired pancytopenia due to recent treatment She is not symptomatic and does not need transfusion support She will continue neutropenic precaution along with antimicrobial therapy  Physical deconditioning She has significant physical deconditioning since recent treatment She also have signs of protein malnutrition She will continue physical therapy and home and graduated exercise as tolerated Her recent x-ray of the knee was negative except for degenerative changes  No orders of the defined types were placed in this encounter.   All questions were answered. The patient knows to call the clinic with any problems, questions or concerns. The total time spent in the appointment was 30 minutes encounter with patients including review of chart and various tests results, discussions about plan of care and coordination of care plan   Artis Delay, MD 12/29/2022 3:27 PM  INTERVAL HISTORY: Please see below for problem oriented charting. she returns for  supportive care visit She was discharged from [facility] after her treatment and is currently recovering at home She is still participating in physical therapy and rehab at home She is using her walker at home for stability In general, she is not able to negotiate many steps at home due to fatigue and discomfort on her knees We discussed test results and future appointment  REVIEW OF SYSTEMS:   Constitutional: Denies fevers, chills or abnormal weight loss Eyes: Denies blurriness of vision Ears, nose, mouth, throat, and face: Denies mucositis or sore throat Respiratory: Denies cough, dyspnea or wheezes Cardiovascular: Denies palpitation, chest discomfort or lower extremity swelling Gastrointestinal:  Denies nausea, heartburn or change in bowel habits Skin: Denies abnormal skin rashes Lymphatics: Denies new lymphadenopathy or easy bruising Behavioral/Psych: Mood is stable, no new changes  All other systems were reviewed with the patient and are negative.  I have reviewed the past medical history, past surgical history, social history and family history with the patient and they are unchanged from previous note.  ALLERGIES:  has No Known Allergies.  MEDICATIONS:  Current Outpatient Medications  Medication Sig Dispense Refill   fluconazole (DIFLUCAN) 200 MG tablet Take 400 mg by mouth 2 (two) times daily.     levETIRAcetam (KEPPRA) 500 MG tablet Take 500 mg by mouth 2 (two) times daily.     levofloxacin (LEVAQUIN) 500 MG tablet Take 500 mg by mouth daily.     prochlorperazine (COMPAZINE) 5 MG tablet Take 5 mg by mouth every 6 (six) hours as needed for nausea or vomiting.     senna-docusate (SENOKOT-S) 8.6-50 MG tablet Take 2 tablets by mouth daily as needed.     sulfamethoxazole-trimethoprim (BACTRIM DS) 800-160 MG tablet Take 1 tablet  by mouth 3 (three) times a week. Take Monday/ Wednesday and Friday     acyclovir (ZOVIRAX) 400 MG tablet Take 1 tablet (400 mg total) by mouth 2 (two) times  daily. 180 tablet 1   metoprolol tartrate (LOPRESSOR) 25 MG tablet TAKE 1 AND 1/2 TABLETS BY MOUTH TWICE DAILY 90 tablet 0   Multiple Vitamins-Minerals (PRESERVISION AREDS 2 PO) Take 1 tablet by mouth 2 (two) times daily.     ondansetron (ZOFRAN) 8 MG tablet Take 1 tablet (8 mg total) by mouth every 8 (eight) hours as needed (Nausea or vomiting). (Patient taking differently: Take 8 mg by mouth 2 (two) times daily.) 60 tablet 1   pantoprazole (PROTONIX) 40 MG tablet TAKE 1 TABLET BY MOUTH 2 TIMES DAILY. TAKE 30-60 MINUTES BEFORE BREAKFAST AND DINNER. (Patient taking differently: Take 20 mg by mouth daily.) 180 tablet 1   No current facility-administered medications for this visit.    SUMMARY OF ONCOLOGIC HISTORY: Oncology History Overview Note  Multiple myeloma, Ig A Lambda, M spike 3.54 grams, Calcium 9.2, Creatinine 0.8, Beta 2 microglobulin 4.52, IgA 4840 mg/dL, lambda light chain 16.1, albumin 3.6, hemoglobin 9.7, platelet 115    Primary site: Multiple Myeloma   Staging method: AJCC 6th Edition   Clinical: Stage IIA signed by Artis Delay, MD on 11/07/2013  2:46 PM   Summary: Stage IIA S/p Allo transplant at [facility] on revlimid, pomalyst, daratumumab and velcade     Multiple myeloma in relapse (HCC)  10/31/2013 Bone Marrow Biopsy   Bone marrow biopsy confirmed multiple myeloma with 40% bone marrow involvement. Skeletal survey showed minimal lesions in her score with generalized demineralization   11/10/2013 - 02/13/2014 Chemotherapy   The patient is started on induction chemotherapy with weekly dexamethasone 40 mg by mouth as well as Velcade subcutaneous injection on days 1, 4, 8 and 11. On 11/21/2013, she was started on monthly Zometa.   12/23/2013 Adverse Reaction   The dose of Velcade was reduced due to thrombocytopenia.   01/28/2014 - 04/07/2014 Chemotherapy   Revlimid is added. Treatment was discontinued due to lack of response.   02/24/2014 - 04/07/2014 Chemotherapy   Due to  worsening peripheral neuropathy, Velcade injection is changed to once a week. Revlimid was given 21 days on, 7 days off.   04/07/2014 - 04/10/2014 Chemotherapy   Revlimid was discontinued due to lack of response. Chemotherapy was changed back to Velcade injection twice a week, 2 weeks on 1 week off. Her treatment was switched to to minimum response   04/20/2014 - 06/02/2014 Chemotherapy   chemotherapy is switched to Carfilzomib, Cytoxan and dexamethasone.   04/22/2014 Procedure   she has placement of port for chemotherapy.   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 at [facility]   10/20/2014 - 03/24/2015 Chemotherapy    she received chemotherapy with Kyprolis, Revlimid and dexamethasone   10/22/2014 Procedure   She has port placement   01/19/2015 Tumor Marker   IgA lambda M spike at 0.4 g    01/20/2015 Miscellaneous   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 No cource was found and fever resolved with IV vancomycin and meropenem   09/13/2015 Bone Marrow Biopsy   Bone marrow biopsy showed no increased blasts, 3-4 % plasma cells   03/02/2016 Bone Marrow Biopsy    Bone marrow biopsy at [facility] showed normocellular (30%) bone marrow with trilineage hematopoiesis. No significant increase in blasts. No significant increase in plasma cells.   05/12/2016 Imaging   DEXA scan at [facility] showed osteopenia   10/24/2016 Imaging   Skeletal survey at [facility], no new lesions   12/07/2017 Imaging   No focal abnormality noted to suggest myeloma. Exam is stable from prior exam.   03/01/2018 Procedure   Successful 8 French right internal jugular vein power port placement with its tip at the SVC/RA junction.   03/06/2018 -  Chemotherapy   The patient had daratumumab    07/09/2018 Bone Marrow Biopsy   Bone marrow biopsy at [facility] showed residual disease at 0.004% plasma cells   07/03/2019 Bone Marrow Biopsy   A. Bone marrow, flow cytometric analysis for multiple myeloma minimal residual disease detection:   Negative. No phenotypically abnormal plasma cells at or above the limit of detection identified.     No monotypic B-cell population identified. Negative for increased blasts.   03/25/2021 Bone Marrow Biopsy   Repeat bone marrow biopsy at [facility] showed female donor karyotype.  5 to 7% plasma cell is seen.  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   04/20/2021 Echocardiogram   1. Left ventricular ejection fraction, by estimation, is 60 to 65%. The left ventricle has normal function. The left ventricle has no regional wall motion abnormalities. Left ventricular diastolic parameters were normal.  2. Right ventricular systolic function is normal. The right ventricular size is normal.  3. The mitral valve is normal in structure. No evidence of mitral valve regurgitation. No evidence of mitral stenosis.  4. The aortic valve is normal in structure. Aortic valve regurgitation is not visualized. No aortic stenosis is present.   5. The inferior vena cava is normal in size with greater than 50% respiratory variability, suggesting right atrial pressure of 3 mmHg.   04/29/2021 - 02/17/2022 Chemotherapy   Patient is on Treatment Plan : MYELOMA RELAPSED/ REFRACTORY Carfilzomib D1,8,15 (20/27) + Dexamethasone (KPd) q28d     04/30/2021 - 09/28/2022 Chemotherapy   Patient is on Treatment Plan : MYELOMA RELAPSED/REFRACTORY Carfilzomib (20/70) D1,8,15 + Dexamethasone weekly (40) (Kd) q28d  x 9 cycles / Dexamethasone D1,8,15     05/04/2021 PET scan   1. No evidence active multiple myeloma within the skeleton on FDG PET scan. 2. No suspicious lytic lesion on CT portion exam. 3. No plasmacytoma.   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HCC)  04/06/2015 Bone Marrow Biopsy   Accession: [id number] BM biopsy showed RAEB-1   04/06/2015 Tumor Marker   Cytogenetics and FISH for MDS are within normal limits   10/06/2015 - 10/10/2015 Chemotherapy   She received conditioning  chemotherapy with busulfan and melphalan   10/12/2015 Bone Marrow Transplant   She received allogenic stem cell transplant   10/19/2015 Adverse Reaction   She developed posttransplant complication with mucositis, viral infection with rhinovirus, neutropenic fever, bilateral pleural effusion and moderate pericardial effusion and CMV reactivation.   10/31/2015 Miscellaneous   She has engrafted     PHYSICAL EXAMINATION: ECOG PERFORMANCE STATUS: 2 - Symptomatic, <50% confined to bed  Vitals:   12/29/22 0940  BP: 134/70  Pulse: 65  Resp: 17  Temp: 98.6 F (37 C)  SpO2: 100%   Filed Weights   12/29/22 0940  Weight: 148 lb 4.8 oz (67.3 kg)    GENERAL:alert, no distress and comfortable NEURO: alert & oriented x 3 with fluent speech, no focal motor/sensory deficits  LABORATORY DATA:  I have reviewed the data as listed    Component Value Date/Time   NA 143 12/29/2022 0924   NA 142 01/04/2017 0902   K 4.1 12/29/2022 0924   K 3.9 01/04/2017  0902   CL 110 12/29/2022 0924   CO2 26 12/29/2022 0924   CO2 28 01/04/2017 0902   GLUCOSE 109 (H) 12/29/2022 0924   GLUCOSE 92 01/04/2017 0902   BUN 15 12/29/2022 0924   BUN 9.5 01/04/2017 0902   CREATININE 0.80 12/29/2022 0924   CREATININE 0.76 09/26/2022 1256   CREATININE 0.7 01/04/2017 0902   CALCIUM 8.9 12/29/2022 0924   CALCIUM 9.0 01/04/2017 0902   PROT 5.1 (L) 12/29/2022 0924   PROT 6.0 (L) 01/04/2017 0902   PROT 5.8 (L) 01/04/2017 0902   ALBUMIN 3.9 12/29/2022 0924   ALBUMIN 3.4 (L) 01/04/2017 0902   AST 16 12/29/2022 0924   AST 9 (L) 09/26/2022 1256   AST 21 01/04/2017 0902   ALT 10 12/29/2022 0924   ALT 6 09/26/2022 1256   ALT 16 01/04/2017 0902   ALKPHOS 56 12/29/2022 0924   ALKPHOS 101 01/04/2017 0902   BILITOT 0.3 12/29/2022 0924   BILITOT 0.3 09/26/2022 1256   BILITOT 0.56 01/04/2017 0902   GFRNONAA >60 12/29/2022 0924   GFRNONAA >60 09/26/2022 1256   GFRAA >60 03/25/2020 0917   GFRAA >60 12/18/2018 0901    No results found for: "SPEP", "UPEP"  Lab Results  Component Value Date   WBC 1.5 (L) 12/29/2022   NEUTROABS 0.7 (L) 12/29/2022   HGB 9.5 (L) 12/29/2022   HCT 28.4 (L) 12/29/2022   MCV 117.4 (H) 12/29/2022   PLT 53 (L) 12/29/2022      Chemistry      Component Value Date/Time   NA 143 12/29/2022 0924   NA 142 01/04/2017 0902   K 4.1 12/29/2022 0924   K 3.9 01/04/2017 0902   CL 110 12/29/2022 0924   CO2 26 12/29/2022 0924   CO2 28 01/04/2017 0902   BUN 15 12/29/2022 0924   BUN 9.5 01/04/2017 0902   CREATININE 0.80 12/29/2022 0924   CREATININE 0.76 09/26/2022 1256   CREATININE 0.7 01/04/2017 0902      Component Value Date/Time   CALCIUM 8.9 12/29/2022 0924   CALCIUM 9.0 01/04/2017 0902   ALKPHOS 56 12/29/2022 0924   ALKPHOS 101 01/04/2017 0902   AST 16 12/29/2022 0924   AST 9 (L) 09/26/2022 1256   AST 21 01/04/2017 0902   ALT 10 12/29/2022 0924   ALT 6 09/26/2022 1256   ALT 16 01/04/2017 0902   BILITOT 0.3 12/29/2022 0924    BILITOT 0.3 09/26/2022 1256   BILITOT 0.56 01/04/2017 0902  RADIOGRAPHIC STUDIES: I have personally reviewed the radiological images as listed and agreed with the findings in the report. DG Hip Unilat W or W/O Pelvis 1 View Right  Result Date: 12/12/2022 CLINICAL DATA:  Pain. EXAM: DG HIP (WITH OR WITHOUT PELVIS) 2v RIGHT COMPARISON:  None Available. FINDINGS: Mild degenerative changes with joint space narrowing. No acute fracture, dislocation or subluxation. Osseous structures are osteopenic. Pelvic ring is intact. There are lumbosacral and sacroiliac degenerative changes. IMPRESSION: Osteopenia.  Degenerative changes.  No acute osseous abnormalities. Electronically Signed   By: Layla Maw M.D.   On: 12/12/2022 20:24   DG FEMUR, MIN 2 VIEWS RIGHT  Result Date: 12/12/2022 CLINICAL DATA:  Pain. EXAM: RIGHT FEMUR 2 VIEWS COMPARISON:  None Available. FINDINGS: Osseous structures are osteopenic. There is no evidence of fracture or other focal bone lesions. Soft tissues are unremarkable. IMPRESSION: Osteopenia.  No acute osseous abnormalities. Electronically Signed   By: Layla Maw M.D.   On: 12/12/2022 20:20   DG Knee 3 Views Right  Result Date: 12/12/2022 CLINICAL DATA:  right groin,thigh and knee pain EXAM: RIGHT KNEE - 3 VIEW COMPARISON:  None Available. FINDINGS: Osseous structures are osteopenic no evidence of fracture, dislocation, or joint effusion. There is mild tricompartmental joint space narrowing and osteophytes consistent with degenerative joint disease. No evidence of effusion. IMPRESSION: Degenerative changes. Osteopenia. Electronically Signed   By: Layla Maw M.D.   On: 12/12/2022 20:18

## 2023-01-05 ENCOUNTER — Other Ambulatory Visit: Payer: Self-pay | Admitting: Cardiovascular Disease

## 2023-01-15 ENCOUNTER — Telehealth: Payer: Self-pay | Admitting: Hematology and Oncology

## 2023-01-15 ENCOUNTER — Other Ambulatory Visit: Payer: Self-pay | Admitting: Hematology and Oncology

## 2023-01-15 ENCOUNTER — Telehealth: Payer: Self-pay

## 2023-01-15 DIAGNOSIS — D801 Nonfamilial hypogammaglobulinemia: Secondary | ICD-10-CM

## 2023-01-15 NOTE — Telephone Encounter (Signed)
Spoke with patient confirming upcoming appointments  

## 2023-01-15 NOTE — Telephone Encounter (Signed)
Returned her call. She said that [facility] from [place] was supposed to call and she need IVIG monthly. She prefers [facility]. Told her that we have not received a call. Told her Dr. Bertis Ruddy sent a scheduling message to schedule on 8/8. She verbalized understanding.

## 2023-01-18 ENCOUNTER — Telehealth: Payer: Self-pay | Admitting: Hematology and Oncology

## 2023-01-18 NOTE — Telephone Encounter (Signed)
TC from [facility] spoke with Ainsley Spinner [phone] about Pt receiving IVIG. Informed Gavin Pound that Pt is scheduled for 02/02/23  to see Dr Bertis Ruddy and receive IVIG.

## 2023-01-23 ENCOUNTER — Telehealth: Payer: Self-pay | Admitting: Hematology and Oncology

## 2023-02-02 ENCOUNTER — Other Ambulatory Visit: Payer: Medicare HMO

## 2023-02-02 ENCOUNTER — Inpatient Hospital Stay: Payer: Medicare HMO

## 2023-02-02 ENCOUNTER — Encounter: Payer: Self-pay | Admitting: Hematology and Oncology

## 2023-02-02 ENCOUNTER — Inpatient Hospital Stay: Payer: Medicare HMO | Attending: Hematology and Oncology

## 2023-02-02 ENCOUNTER — Inpatient Hospital Stay: Payer: Medicare HMO | Attending: Hematology and Oncology | Admitting: Hematology and Oncology

## 2023-02-02 ENCOUNTER — Other Ambulatory Visit: Payer: Self-pay

## 2023-02-02 VITALS — BP 127/57 | HR 66 | Temp 97.6°F | Resp 18 | Ht 62.0 in | Wt 149.2 lb

## 2023-02-02 VITALS — BP 148/72 | HR 70 | Temp 98.0°F | Resp 18

## 2023-02-02 DIAGNOSIS — D6481 Anemia due to antineoplastic chemotherapy: Secondary | ICD-10-CM

## 2023-02-02 DIAGNOSIS — D61818 Other pancytopenia: Secondary | ICD-10-CM | POA: Diagnosis not present

## 2023-02-02 DIAGNOSIS — C9002 Multiple myeloma in relapse: Secondary | ICD-10-CM

## 2023-02-02 DIAGNOSIS — D801 Nonfamilial hypogammaglobulinemia: Secondary | ICD-10-CM

## 2023-02-02 DIAGNOSIS — D469 Myelodysplastic syndrome, unspecified: Secondary | ICD-10-CM

## 2023-02-02 LAB — COMPREHENSIVE METABOLIC PANEL
ALT: 10 U/L (ref 0–44)
AST: 16 U/L (ref 15–41)
Albumin: 4.4 g/dL (ref 3.5–5.0)
Alkaline Phosphatase: 58 U/L (ref 38–126)
Anion gap: 9 (ref 5–15)
BUN: 19 mg/dL (ref 8–23)
CO2: 26 mmol/L (ref 22–32)
Calcium: 8.8 mg/dL — ABNORMAL LOW (ref 8.9–10.3)
Chloride: 107 mmol/L (ref 98–111)
Creatinine, Ser: 0.91 mg/dL (ref 0.44–1.00)
GFR, Estimated: >60 mL/min (ref >60)
Glucose, Bld: 108 mg/dL — ABNORMAL HIGH (ref 70–99)
Potassium: 4.1 mmol/L (ref 3.5–5.1)
Sodium: 142 mmol/L (ref 135–145)
Total Bilirubin: 0.4 mg/dL (ref 0.3–1.2)
Total Protein: 6 g/dL — ABNORMAL LOW (ref 6.5–8.1)

## 2023-02-02 LAB — CBC WITH DIFFERENTIAL/PLATELET
Abs Immature Granulocytes: 0.01 10*3/uL (ref 0.00–0.07)
Basophils Absolute: 0 10*3/uL (ref 0.0–0.1)
Basophils Relative: 0 %
Eosinophils Absolute: 0 10*3/uL (ref 0.0–0.5)
Eosinophils Relative: 0 %
HCT: 33.3 % — ABNORMAL LOW (ref 36.0–46.0)
Hemoglobin: 11.6 g/dL — ABNORMAL LOW (ref 12.0–15.0)
Immature Granulocytes: 0 %
Lymphocytes Relative: 30 %
Lymphs Abs: 0.7 10*3/uL (ref 0.7–4.0)
MCH: 40.3 pg — ABNORMAL HIGH (ref 26.0–34.0)
MCHC: 34.8 g/dL (ref 30.0–36.0)
MCV: 115.6 fL — ABNORMAL HIGH (ref 80.0–100.0)
Monocytes Absolute: 0.3 10*3/uL (ref 0.1–1.0)
Monocytes Relative: 14 %
Neutro Abs: 1.3 10*3/uL — ABNORMAL LOW (ref 1.7–7.7)
Neutrophils Relative %: 56 %
Platelets: 60 10*3/uL — ABNORMAL LOW (ref 150–400)
RBC: 2.88 MIL/uL — ABNORMAL LOW (ref 3.87–5.11)
RDW: 12.2 % (ref 11.5–15.5)
WBC: 2.3 10*3/uL — ABNORMAL LOW (ref 4.0–10.5)
nRBC: 0 % (ref 0.0–0.2)

## 2023-02-02 LAB — SAMPLE TO BLOOD BANK

## 2023-02-02 MED ORDER — METHYLPREDNISOLONE SODIUM SUCC 40 MG IJ SOLR
40.0000 mg | Freq: Once | INTRAMUSCULAR | Status: AC
  Administered 2023-02-02: 40 mg via INTRAVENOUS
  Filled 2023-02-02: qty 1

## 2023-02-02 MED ORDER — MIRTAZAPINE 15 MG PO TABS: 15.0000 mg | ORAL_TABLET | Freq: Every day | ORAL | 1 refills | Status: DC

## 2023-02-02 MED ORDER — DEXTROSE 5 % IV SOLN: Freq: Once | INTRAVENOUS | Status: AC

## 2023-02-02 MED ORDER — FAMOTIDINE IN NACL 20-0.9 MG/50ML-% IV SOLN
20.0000 mg | Freq: Once | INTRAVENOUS | Status: AC
  Administered 2023-02-02: 20 mg via INTRAVENOUS
  Filled 2023-02-02: qty 50

## 2023-02-02 MED ORDER — ACETAMINOPHEN 325 MG PO TABS
650.0000 mg | ORAL_TABLET | Freq: Once | ORAL | Status: AC
  Administered 2023-02-02: 650 mg via ORAL
  Filled 2023-02-02: qty 2

## 2023-02-02 MED ORDER — IMMUNE GLOBULIN (HUMAN) 10 GM/100ML IV SOLN
1.0000 g/kg | Freq: Once | INTRAVENOUS | Status: AC
  Administered 2023-02-02: 70 g via INTRAVENOUS
  Filled 2023-02-02: qty 700

## 2023-02-02 NOTE — Assessment & Plan Note (Signed)
We discussed the role of IVIG infusion Due to recent slight reaction with elevated blood pressure, I will add additional premedications She will return here again in 4 weeks for further follow-up

## 2023-02-02 NOTE — Progress Notes (Signed)
[facility] OFFICE PROGRESS NOTE  Patient Care Team: Default, Provider, MD as PCP - General Kirke Corin Chelsea Aus, MD as PCP - Cardiology (Cardiology) Astrid Drafts, MD as Referring Physician (Hematology and Oncology) Eddie Candle, MD as Consulting Physician (Internal Medicine) Daiva Eves, Lisette Grinder, MD as Consulting Physician (Infectious Diseases) Lorriane Shire An, MD as Consulting Physician (Hematology and Oncology) Sadie Haber, NP as Nurse Practitioner (Hematology and Oncology) Artis Delay, MD as Consulting Physician (Hematology and Oncology)  ASSESSMENT & PLAN:  Multiple myeloma in relapse [facility]) She is still in the recovery phase after recent treatment at [facility] She remained pancytopenic but I am hopeful it will continue to improve in the near future I will defer to her hematologist at [facility] to dictate her care She has appointment pending for further evaluation at [facility]  Pancytopenia, acquired [facility]) Her blood counts are trending up She felt a bit better and is able to do more physical activity She does not need transfusion support She will continue antimicrobial treatment  Acquired hypogammaglobulinemia We discussed the role of IVIG infusion Due to recent slight reaction with elevated blood pressure, I will add additional premedications She will return here again in 4 weeks for further follow-up  No orders of the defined types were placed in this encounter.   All questions were answered. The patient knows to call the clinic with any problems, questions or concerns. The total time spent in the appointment was 30 minutes encounter with patients including review of chart and various tests results, discussions about plan of care and coordination of care plan   Artis Delay, MD 02/02/2023 11:45 AM  INTERVAL HISTORY: Please see below for problem oriented charting. she returns for treatment follow-up seen prior to IVIG infusion The patient  developed slight reaction with elevated blood pressure with her last visit Otherwise, she felt stronger No recent infection She has multiple appointment pending at [facility] We reviewed test results and discussed plan of care  REVIEW OF SYSTEMS:   Constitutional: Denies fevers, chills or abnormal weight loss Eyes: Denies blurriness of vision Ears, nose, mouth, throat, and face: Denies mucositis or sore throat Respiratory: Denies cough, dyspnea or wheezes Cardiovascular: Denies palpitation, chest discomfort or lower extremity swelling Gastrointestinal:  Denies nausea, heartburn or change in bowel habits Skin: Denies abnormal skin rashes Lymphatics: Denies new lymphadenopathy or easy bruising Neurological:Denies numbness, tingling or new weaknesses Behavioral/Psych: Mood is stable, no new changes  All other systems were reviewed with the patient and are negative.  I have reviewed the past medical history, past surgical history, social history and family history with the patient and they are unchanged from previous note.  ALLERGIES:  has No Known Allergies.  MEDICATIONS:  Current Outpatient Medications  Medication Sig Dispense Refill   mirtazapine (REMERON) 15 MG tablet Take 1 tablet (15 mg total) by mouth at bedtime. 90 tablet 1   acyclovir (ZOVIRAX) 400 MG tablet Take 1 tablet (400 mg total) by mouth 2 (two) times daily. 180 tablet 1   fluconazole (DIFLUCAN) 200 MG tablet Take 400 mg by mouth 2 (two) times daily.     levofloxacin (LEVAQUIN) 500 MG tablet Take 500 mg by mouth daily.     metoprolol tartrate (LOPRESSOR) 25 MG tablet TAKE 1 & 1/2 TABLET BY MOUTH TWICE A DAY 270 tablet 0   Multiple Vitamins-Minerals (PRESERVISION AREDS 2 PO) Take 1 tablet by mouth 2 (two) times daily.     ondansetron (ZOFRAN) 8 MG tablet Take 1  tablet (8 mg total) by mouth every 8 (eight) hours as needed (Nausea or vomiting). (Patient taking differently: Take 8 mg by mouth 2 (two) times daily.) 60 tablet 1    pantoprazole (PROTONIX) 40 MG tablet TAKE 1 TABLET BY MOUTH 2 TIMES DAILY. TAKE 30-60 MINUTES BEFORE BREAKFAST AND DINNER. (Patient taking differently: Take 20 mg by mouth daily.) 180 tablet 1   prochlorperazine (COMPAZINE) 5 MG tablet Take 5 mg by mouth every 6 (six) hours as needed for nausea or vomiting.     senna-docusate (SENOKOT-S) 8.6-50 MG tablet Take 2 tablets by mouth daily as needed.     sulfamethoxazole-trimethoprim (BACTRIM DS) 800-160 MG tablet Take 1 tablet by mouth 3 (three) times a week. Take Monday/ Wednesday and Friday     No current facility-administered medications for this visit.    SUMMARY OF ONCOLOGIC HISTORY: Oncology History Overview Note  Multiple myeloma, Ig A Lambda, M spike 3.54 grams, Calcium 9.2, Creatinine 0.8, Beta 2 microglobulin 4.52, IgA 4840 mg/dL, lambda light chain 95.6, albumin 3.6, hemoglobin 9.7, platelet 115    Primary site: Multiple Myeloma   Staging method: AJCC 6th Edition   Clinical: Stage IIA signed by Artis Delay, MD on 11/07/2013  2:46 PM   Summary: Stage IIA S/p Allo transplant at [facility] on revlimid, pomalyst, daratumumab and velcade     Multiple myeloma in relapse (HCC)  10/31/2013 Bone Marrow Biopsy   Bone marrow biopsy confirmed multiple myeloma with 40% bone marrow involvement. Skeletal survey showed minimal lesions in her score with generalized demineralization   11/10/2013 - 02/13/2014 Chemotherapy   The patient is started on induction chemotherapy with weekly dexamethasone 40 mg by mouth as well as Velcade subcutaneous injection on days 1, 4, 8 and 11. On 11/21/2013, she was started on monthly Zometa.   12/23/2013 Adverse Reaction   The dose of Velcade was reduced due to thrombocytopenia.   01/28/2014 - 04/07/2014 Chemotherapy   Revlimid is added. Treatment was discontinued due to lack of response.   02/24/2014 - 04/07/2014 Chemotherapy   Due to worsening peripheral neuropathy, Velcade injection is changed to once a week.  Revlimid was given 21 days on, 7 days off.   04/07/2014 - 04/10/2014 Chemotherapy   Revlimid was discontinued due to lack of response. Chemotherapy was changed back to Velcade injection twice a week, 2 weeks on 1 week off. Her treatment was switched to to minimum response   04/20/2014 - 06/02/2014 Chemotherapy   chemotherapy is switched to Carfilzomib, Cytoxan and dexamethasone.   04/22/2014 Procedure   she has placement of port for chemotherapy.   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 [facility]   10/20/2014 - 03/24/2015 Chemotherapy    she received chemotherapy with Kyprolis, Revlimid and dexamethasone   10/22/2014 Procedure   She has port placement   01/19/2015 Tumor Marker   IgA lambda M spike at 0.4 g    01/20/2015 Miscellaneous   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 No cource was found and fever resolved with IV vancomycin and meropenem   09/13/2015 Bone Marrow Biopsy   Bone marrow biopsy showed no increased blasts, 3-4 % plasma cells   03/02/2016 Bone Marrow Biopsy   Bone marrow biopsy at [facility] showed normocellular (30%) bone marrow with  trilineage hematopoiesis. No significant increase in blasts. No significant increase in plasma cells.   05/12/2016 Imaging   DEXA scan at [facility] showed osteopenia   10/24/2016 Imaging   Skeletal survey at [facility], no new lesions   12/07/2017 Imaging   No focal abnormality noted to suggest myeloma. Exam is stable from prior exam.   03/01/2018 Procedure   Successful 8 French right internal jugular vein power port placement with its tip at the SVC/RA junction.   03/06/2018 -  Chemotherapy   The patient had daratumumab    07/09/2018 Bone Marrow Biopsy   Bone marrow biopsy at [facility] showed residual disease at 0.004% plasma cells   07/03/2019 Bone Marrow Biopsy   A. Bone marrow, flow cytometric analysis for multiple myeloma minimal residual disease detection:   Negative. No phenotypically abnormal plasma cells at or above the limit of detection identified.     No monotypic B-cell population identified. Negative for increased blasts.   03/25/2021 Bone Marrow Biopsy   Repeat bone marrow biopsy at [facility] showed female donor karyotype.  5 to 7% plasma cell is seen.  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   04/20/2021 Echocardiogram   1. Left ventricular ejection fraction, by estimation, is 60 to 65%. The left ventricle has normal function. The left ventricle has no regional wall motion abnormalities. Left ventricular diastolic parameters were normal.  2. Right ventricular systolic function is normal. The right ventricular size is normal.  3. The mitral valve is normal in structure. No evidence of mitral valve regurgitation. No evidence of mitral stenosis.  4. The aortic valve is normal in structure. Aortic valve regurgitation is not visualized. No aortic stenosis is present.  5. The inferior vena cava is normal in size with greater than 50%  respiratory variability, suggesting right atrial pressure of 3 mmHg.   04/29/2021 - 02/17/2022 Chemotherapy   Patient is on Treatment Plan : MYELOMA RELAPSED/ REFRACTORY Carfilzomib D1,8,15 (20/27) + Dexamethasone (KPd) q28d     04/30/2021 - 09/28/2022 Chemotherapy   Patient is on Treatment Plan : MYELOMA RELAPSED/REFRACTORY Carfilzomib (20/70) D1,8,15 + Dexamethasone weekly (40) (Kd) q28d  x 9 cycles / Dexamethasone D1,8,15     05/04/2021 PET scan   1. No evidence active multiple myeloma within the skeleton on FDG PET scan. 2. No suspicious lytic lesion on CT portion exam. 3. No plasmacytoma.   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HCC)  04/06/2015 Bone Marrow Biopsy   Accession: [id number] BM biopsy showed RAEB-1   04/06/2015 Tumor Marker   Cytogenetics and FISH for MDS are within normal limits   10/06/2015 - 10/10/2015 Chemotherapy   She received conditioning chemotherapy with busulfan and melphalan   10/12/2015 Bone Marrow Transplant   She received allogenic  stem cell transplant   10/19/2015 Adverse Reaction   She developed posttransplant complication with mucositis, viral infection with rhinovirus, neutropenic fever, bilateral pleural effusion and moderate pericardial effusion and CMV reactivation.   10/31/2015 Miscellaneous   She has engrafted     PHYSICAL EXAMINATION: ECOG PERFORMANCE STATUS: 1 - Symptomatic but completely ambulatory  Vitals:   02/02/23 0837  BP: (!) 127/57  Pulse: 66  Resp: 18  Temp: 97.6 F (36.4 C)  SpO2: 100%   Filed Weights   02/02/23 0837  Weight: 149 lb 3.2 oz (67.7 kg)    GENERAL:alert, no distress and comfortable NEURO: alert & oriented x 3 with fluent speech, no focal motor/sensory deficits  LABORATORY DATA:  I have reviewed the data as listed    Component Value Date/Time   NA 142 02/02/2023 0814   NA 142 01/04/2017 0902   K 4.1 02/02/2023 0814   K 3.9 01/04/2017 0902   CL 107 02/02/2023 0814   CO2 26 02/02/2023  0814   CO2 28 01/04/2017 0902   GLUCOSE 108 (H) 02/02/2023 0814   GLUCOSE 92 01/04/2017 0902   BUN 19 02/02/2023 0814   BUN 9.5 01/04/2017 0902   CREATININE 0.91 02/02/2023 0814   CREATININE 0.76 09/26/2022 1256   CREATININE 0.7 01/04/2017 0902   CALCIUM 8.8 (L) 02/02/2023 0814   CALCIUM 9.0 01/04/2017 0902   PROT 6.0 (L) 02/02/2023 0814   PROT 6.0 (L) 01/04/2017 0902   PROT 5.8 (L) 01/04/2017 0902   ALBUMIN 4.4 02/02/2023 0814   ALBUMIN 3.4 (L) 01/04/2017 0902   AST 16 02/02/2023 0814   AST 9 (L) 09/26/2022 1256   AST 21 01/04/2017 0902   ALT 10 02/02/2023 0814   ALT 6 09/26/2022 1256   ALT 16 01/04/2017 0902   ALKPHOS 58 02/02/2023 0814   ALKPHOS 101 01/04/2017 0902   BILITOT 0.4 02/02/2023 0814   BILITOT 0.3 09/26/2022 1256   BILITOT 0.56 01/04/2017 0902   GFRNONAA >60 02/02/2023 0814   GFRNONAA >60 09/26/2022 1256   GFRAA >60 03/25/2020 0917   GFRAA >60 12/18/2018 0901    No results found for: "SPEP", "UPEP"  Lab Results  Component Value Date   WBC 2.3 (L) 02/02/2023   NEUTROABS 1.3 (L) 02/02/2023   HGB 11.6 (L) 02/02/2023   HCT 33.3 (L) 02/02/2023   MCV 115.6 (H) 02/02/2023   PLT 60 (L) 02/02/2023      Chemistry      Component Value Date/Time   NA 142 02/02/2023 0814   NA 142 01/04/2017 0902   K 4.1 02/02/2023 0814   K 3.9 01/04/2017 0902   CL 107 02/02/2023 0814   CO2 26 02/02/2023 0814   CO2 28 01/04/2017 0902   BUN 19 02/02/2023 0814   BUN 9.5 01/04/2017 0902   CREATININE 0.91 02/02/2023 0814   CREATININE 0.76 09/26/2022 1256   CREATININE 0.7 01/04/2017 0902      Component Value Date/Time   CALCIUM 8.8 (L) 02/02/2023 0814   CALCIUM 9.0 01/04/2017 0902   ALKPHOS 58 02/02/2023 0814   ALKPHOS 101 01/04/2017 0902   AST 16 02/02/2023 0814   AST 9 (L) 09/26/2022 1256   AST 21 01/04/2017 0902   ALT 10 02/02/2023 0814   ALT 6 09/26/2022 1256   ALT 16 01/04/2017 0902   BILITOT 0.4 02/02/2023 0814   BILITOT 0.3 09/26/2022 1256   BILITOT 0.56  01/04/2017 0902

## 2023-02-02 NOTE — Assessment & Plan Note (Signed)
She is still in the recovery phase after recent treatment at [facility] She remained pancytopenic but I am hopeful it will continue to improve in the near future I will defer to her hematologist at [facility] to dictate her care She has appointment pending for further evaluation at [facility]

## 2023-02-02 NOTE — Patient Instructions (Signed)

## 2023-02-02 NOTE — Assessment & Plan Note (Signed)
Her blood counts are trending up She felt a bit better and is able to do more physical activity She does not need transfusion support She will continue antimicrobial treatment

## 2023-02-22 ENCOUNTER — Telehealth: Payer: Self-pay | Admitting: *Deleted

## 2023-02-22 NOTE — Telephone Encounter (Signed)
I have canceled her IVIG and keep appt, will discuss

## 2023-02-22 NOTE — Telephone Encounter (Signed)
JeanDavidson left voice mail with following information for Dr. Bertis Ruddy: She zoomed/spoke with Dr Barbaraann Boys and Ms. Hennig, PA over last 2 days.Their notes should be in her chart.   [facility] providers want her to have IVIG based on results of monthly myeloma panels and want her to cancel next Friday's IVIG scheduled here due to her numbers being up.   Ms. Sandi said this is the summary of her appts with [facility]: -She should get Neupogen here if possible, but can be scheduled at [facility] if needed -Hold Zometa as she needs dental work -She needs weekly CBC and Chemistry panels -She needs monthly Myeloma panels. Last one 8/15 at [facility] stopping Bactrim and changing it to something else which they will manage -Biopsy report 0.06 abnormal plasma cells.Not completely disease negative.She states that is "somewhat disappointing" -Still does not have FISH back     Ms. Risinger said she wants to keep appts on  9/6 for port flush w/labs to get CBC and CMP and see Dr. Bertis Ruddy to discuss when to start having Myeloma Panels done. She only wants to cancel IVIG  Message routed to Dr Bertis Ruddy

## 2023-02-23 ENCOUNTER — Encounter: Payer: Self-pay | Admitting: Hematology and Oncology

## 2023-02-23 NOTE — Telephone Encounter (Signed)
Contacted patient with Dr. Maxine Glenn response - Ms. Ruckel stated she appreciated the call and information.

## 2023-03-02 ENCOUNTER — Other Ambulatory Visit: Payer: Self-pay | Admitting: Hematology and Oncology

## 2023-03-02 ENCOUNTER — Inpatient Hospital Stay: Payer: Medicare HMO

## 2023-03-02 ENCOUNTER — Ambulatory Visit: Payer: Medicare HMO

## 2023-03-02 ENCOUNTER — Telehealth: Payer: Self-pay

## 2023-03-02 ENCOUNTER — Other Ambulatory Visit: Payer: Medicare HMO

## 2023-03-02 ENCOUNTER — Inpatient Hospital Stay: Payer: Medicare HMO | Attending: Hematology and Oncology | Admitting: Hematology and Oncology

## 2023-03-02 ENCOUNTER — Encounter: Payer: Self-pay | Admitting: Hematology and Oncology

## 2023-03-02 VITALS — BP 131/66 | HR 66 | Temp 97.9°F | Resp 18 | Ht 62.0 in | Wt 156.0 lb

## 2023-03-02 DIAGNOSIS — D61818 Other pancytopenia: Secondary | ICD-10-CM | POA: Insufficient documentation

## 2023-03-02 DIAGNOSIS — C9002 Multiple myeloma in relapse: Secondary | ICD-10-CM

## 2023-03-02 DIAGNOSIS — M8580 Other specified disorders of bone density and structure, unspecified site: Secondary | ICD-10-CM | POA: Diagnosis not present

## 2023-03-02 DIAGNOSIS — D801 Nonfamilial hypogammaglobulinemia: Secondary | ICD-10-CM | POA: Insufficient documentation

## 2023-03-02 DIAGNOSIS — Z9221 Personal history of antineoplastic chemotherapy: Secondary | ICD-10-CM | POA: Diagnosis not present

## 2023-03-02 DIAGNOSIS — D469 Myelodysplastic syndrome, unspecified: Secondary | ICD-10-CM

## 2023-03-02 DIAGNOSIS — D6481 Anemia due to antineoplastic chemotherapy: Secondary | ICD-10-CM

## 2023-03-02 LAB — COMPREHENSIVE METABOLIC PANEL
ALT: 9 U/L (ref 0–44)
AST: 17 U/L (ref 15–41)
Albumin: 3.8 g/dL (ref 3.5–5.0)
Alkaline Phosphatase: 53 U/L (ref 38–126)
Anion gap: 7 (ref 5–15)
BUN: 16 mg/dL (ref 8–23)
CO2: 26 mmol/L (ref 22–32)
Calcium: 9.1 mg/dL (ref 8.9–10.3)
Chloride: 107 mmol/L (ref 98–111)
Creatinine, Ser: 0.79 mg/dL (ref 0.44–1.00)
GFR, Estimated: >60 mL/min (ref >60)
Glucose, Bld: 87 mg/dL (ref 70–99)
Potassium: 4 mmol/L (ref 3.5–5.1)
Sodium: 140 mmol/L (ref 135–145)
Total Bilirubin: 0.4 mg/dL (ref 0.3–1.2)
Total Protein: 6.5 g/dL (ref 6.5–8.1)

## 2023-03-02 LAB — CBC WITH DIFFERENTIAL/PLATELET
Abs Immature Granulocytes: 0.01 10*3/uL (ref 0.00–0.07)
Basophils Absolute: 0 10*3/uL (ref 0.0–0.1)
Basophils Relative: 0 %
Eosinophils Absolute: 0 10*3/uL (ref 0.0–0.5)
Eosinophils Relative: 0 %
HCT: 30 % — ABNORMAL LOW (ref 36.0–46.0)
Hemoglobin: 10.3 g/dL — ABNORMAL LOW (ref 12.0–15.0)
Immature Granulocytes: 0 %
Lymphocytes Relative: 19 %
Lymphs Abs: 0.5 10*3/uL — ABNORMAL LOW (ref 0.7–4.0)
MCH: 39.9 pg — ABNORMAL HIGH (ref 26.0–34.0)
MCHC: 34.3 g/dL (ref 30.0–36.0)
MCV: 116.3 fL — ABNORMAL HIGH (ref 80.0–100.0)
Monocytes Absolute: 0.4 10*3/uL (ref 0.1–1.0)
Monocytes Relative: 13 %
Neutro Abs: 1.9 10*3/uL (ref 1.7–7.7)
Neutrophils Relative %: 68 %
Platelets: 60 10*3/uL — ABNORMAL LOW (ref 150–400)
RBC: 2.58 MIL/uL — ABNORMAL LOW (ref 3.87–5.11)
RDW: 13.2 % (ref 11.5–15.5)
WBC: 2.9 10*3/uL — ABNORMAL LOW (ref 4.0–10.5)
nRBC: 0 % (ref 0.0–0.2)

## 2023-03-02 MED ORDER — PANTOPRAZOLE SODIUM 40 MG PO TBEC: 40.0000 mg | DELAYED_RELEASE_TABLET | Freq: Every day | ORAL | Status: DC

## 2023-03-02 NOTE — Assessment & Plan Note (Signed)
Her last bone density scan was in 2020 I will order repeat bone density scan She has resumed taking vitamin D supplement Recommend dental evaluation before resuming treatment with Zometa

## 2023-03-02 NOTE — Progress Notes (Signed)
[facility] OFFICE PROGRESS NOTE  Patient Care Team: Default, Provider, MD as PCP - General Iran Ouch, MD as PCP - Cardiology (Cardiology) Astrid Drafts, MD as Referring Physician (Hematology and Oncology) Eddie Candle, MD as Consulting Physician (Internal Medicine) Daiva Eves, Lisette Grinder, MD as Consulting Physician (Infectious Diseases) Lorriane Shire An, MD as Consulting Physician (Hematology and Oncology) Sadie Haber, NP as Nurse Practitioner (Hematology and Oncology) Artis Delay, MD as Consulting Physician (Hematology and Oncology)  ASSESSMENT & PLAN:  Multiple myeloma in relapse 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 The results that was drawn at [facility] was done approximately a week after IVIG treatment and of course he will be high I will repeat immunoglobulin levels next month and if it remains low, she will resume IVIG treatment  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HCC) She has persistent pancytopenia but not symptomatic She does not need G-CSF support I will see her again next month for further follow-up   Osteopenia Her last bone density scan was in 2020 I will order repeat bone density scan She has resumed taking vitamin D supplement Recommend dental evaluation before resuming treatment with Zometa  Orders Placed This Encounter  Procedures   DG BONE DENSITY (DXA)    Standing Status:   Future    Standing Expiration Date:   03/01/2024    Order Specific Question:   Reason for Exam (SYMPTOM  OR DIAGNOSIS REQUIRED)    Answer:   osteoporosis    Order Specific Question:    Preferred imaging location?    Answer:   [facility]    All questions were answered. The patient knows to call the clinic with any problems, questions or concerns. The total time spent in the appointment was 30 minutes encounter with patients including review of chart and various tests results, discussions about plan of care and coordination of care plan   Artis Delay, MD 03/02/2023 10:16 AM  INTERVAL HISTORY: Please see below for problem oriented charting. she returns for treatment follow-up I have reviewed records from [facility] and collaborated the history with the patient I was updated her medication list We discussed recommendation from [facility], she is feeling okay Denies recent bleeding We discussed test results and future follow-up The patient had some visual difficulties but I would not recommend cataract surgery right now She had multiple dental issues and I recommend dental evaluation for recommendation before she proceed with dental procedures  REVIEW OF SYSTEMS:   Constitutional: Denies fevers, chills or abnormal weight loss Ears, nose, mouth, throat, and face: Denies mucositis or sore throat Respiratory: Denies cough, dyspnea or wheezes Cardiovascular: Denies palpitation, chest discomfort or lower extremity swelling Gastrointestinal:  Denies nausea, heartburn or change in bowel habits Skin: Denies abnormal skin rashes Lymphatics: Denies new lymphadenopathy or easy bruising Neurological:Denies numbness, tingling or new weaknesses Behavioral/Psych: Mood is stable, no new changes  All other systems were reviewed with the patient and are negative.  I have reviewed the past medical history, past surgical history, social history and family history with the patient and they are unchanged from previous note.  ALLERGIES:  has No Known Allergies.  MEDICATIONS:  Current Outpatient Medications  Medication Sig Dispense  Refill   cholecalciferol (VITAMIN D3) 25 MCG (1000  UNIT) tablet Take 2,000 Units by mouth daily.     levofloxacin (LEVAQUIN) 500 MG tablet Take 500 mg by mouth daily.     acyclovir (ZOVIRAX) 400 MG tablet Take 1 tablet (400 mg total) by mouth 2 (two) times daily. 180 tablet 1   atovaquone (MEPRON) 750 MG/5ML suspension Take 1,500 mg by mouth 2 (two) times daily.     fluconazole (DIFLUCAN) 200 MG tablet Take 400 mg by mouth 2 (two) times daily.     metoprolol tartrate (LOPRESSOR) 25 MG tablet TAKE 1 & 1/2 TABLET BY MOUTH TWICE A DAY 270 tablet 0   mirtazapine (REMERON) 15 MG tablet Take 1 tablet (15 mg total) by mouth at bedtime. 90 tablet 1   Multiple Vitamins-Minerals (PRESERVISION AREDS 2 PO) Take 1 tablet by mouth 2 (two) times daily.     ondansetron (ZOFRAN) 8 MG tablet Take 1 tablet (8 mg total) by mouth every 8 (eight) hours as needed (Nausea or vomiting). (Patient taking differently: Take 8 mg by mouth 2 (two) times daily.) 60 tablet 1   pantoprazole (PROTONIX) 40 MG tablet Take 1 tablet (40 mg total) by mouth daily.     prochlorperazine (COMPAZINE) 5 MG tablet Take 5 mg by mouth every 6 (six) hours as needed for nausea or vomiting.     senna-docusate (SENOKOT-S) 8.6-50 MG tablet Take 2 tablets by mouth daily as needed.     No current facility-administered medications for this visit.    SUMMARY OF ONCOLOGIC HISTORY: Oncology History Overview Note  Multiple myeloma, Ig A Lambda, M spike 3.54 grams, Calcium 9.2, Creatinine 0.8, Beta 2 microglobulin 4.52, IgA 4840 mg/dL, lambda light chain 21.3, albumin 3.6, hemoglobin 9.7, platelet 115    Primary site: Multiple Myeloma   Staging method: AJCC 6th Edition   Clinical: Stage IIA signed by Artis Delay, MD on 11/07/2013  2:46 PM   Summary: Stage IIA S/p Allo transplant at [facility] on revlimid, pomalyst, daratumumab and velcade     Multiple myeloma in relapse (HCC)  10/31/2013 Bone Marrow Biopsy   Bone marrow biopsy confirmed multiple myeloma with 40% bone marrow involvement. Skeletal  survey showed minimal lesions in her score with generalized demineralization   11/10/2013 - 02/13/2014 Chemotherapy   The patient is started on induction chemotherapy with weekly dexamethasone 40 mg by mouth as well as Velcade subcutaneous injection on days 1, 4, 8 and 11. On 11/21/2013, she was started on monthly Zometa.   12/23/2013 Adverse Reaction   The dose of Velcade was reduced due to thrombocytopenia.   01/28/2014 - 04/07/2014 Chemotherapy   Revlimid is added. Treatment was discontinued due to lack of response.   02/24/2014 - 04/07/2014 Chemotherapy   Due to worsening peripheral neuropathy, Velcade injection is changed to once a week. Revlimid was given 21 days on, 7 days off.   04/07/2014 - 04/10/2014 Chemotherapy   Revlimid was discontinued due to lack of response. Chemotherapy was changed back to Velcade injection twice a week, 2 weeks on 1 week off. Her treatment was switched to to minimum response   04/20/2014 - 06/02/2014 Chemotherapy   chemotherapy is switched to Carfilzomib, Cytoxan and dexamethasone.   04/22/2014 Procedure   she has placement of port for chemotherapy.   06/01/2014 Tumor Marker   Bloodwork show that she has greater than partial response   06/23/2014 Bone Marrow Biopsy   Bone marrow biopsy show 5-10% residual plasma cells, normal cytogenetics and FISH  07/07/2014 Procedure   She had stem cell collection   07/22/2014 - 07/22/2014 Chemotherapy   She had high-dose chemotherapy with melphalan   07/23/2014 Bone Marrow Transplant   She had bone marrow transplant in autologous fashion at [facility]   10/20/2014 - 03/24/2015 Chemotherapy    she received chemotherapy with Kyprolis, Revlimid and dexamethasone   10/22/2014 Procedure   She has port placement   01/19/2015 Tumor Marker   IgA lambda M spike at 0.4 g    01/20/2015 Miscellaneous   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 No cource was found and fever resolved with IV vancomycin and meropenem   09/13/2015 Bone Marrow Biopsy   Bone marrow biopsy showed no increased blasts, 3-4 % plasma cells   03/02/2016 Bone Marrow Biopsy   Bone marrow biopsy at [facility] showed normocellular (30%) bone marrow with trilineage hematopoiesis. No significant increase in blasts. No significant increase in plasma cells.   05/12/2016 Imaging   DEXA scan at [facility] showed osteopenia   10/24/2016 Imaging   Skeletal survey at [facility], no new lesions   12/07/2017 Imaging   No focal abnormality noted to suggest myeloma. Exam is stable from prior exam.   03/01/2018 Procedure   Successful 8 French right internal jugular vein power port placement with its tip at the SVC/RA junction.   03/06/2018 -  Chemotherapy   The patient had daratumumab    07/09/2018 Bone Marrow Biopsy   Bone marrow biopsy at [facility] showed residual disease at 0.004% plasma cells   07/03/2019 Bone Marrow Biopsy   A. Bone marrow, flow cytometric analysis for multiple myeloma minimal residual disease detection:   Negative. No phenotypically abnormal plasma cells at or above the limit of detection identified.     No monotypic B-cell population identified. Negative for increased blasts.   03/25/2021 Bone Marrow Biopsy   Repeat bone marrow biopsy at [facility] showed female donor karyotype.  5 to 7% plasma cell is seen.  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   04/20/2021  Echocardiogram   1. Left ventricular ejection fraction, by estimation, is 60 to 65%. The left ventricle has normal function. The left ventricle has no regional wall motion abnormalities. Left ventricular diastolic parameters were normal.  2. Right ventricular systolic function is normal. The right ventricular size is normal.  3. The mitral valve is normal in structure. No evidence of mitral valve regurgitation. No evidence of mitral stenosis.  4. The aortic valve is normal in structure. Aortic valve regurgitation is not visualized. No aortic stenosis is present.  5. The inferior vena cava is normal in size with greater than 50% respiratory variability, suggesting right atrial pressure of 3 mmHg.   04/29/2021 - 02/17/2022 Chemotherapy   Patient is on Treatment Plan : MYELOMA RELAPSED/ REFRACTORY Carfilzomib D1,8,15 (20/27) + Dexamethasone (KPd) q28d     04/30/2021 - 09/28/2022 Chemotherapy   Patient is  on Treatment Plan : MYELOMA RELAPSED/REFRACTORY Carfilzomib (20/70) D1,8,15 + Dexamethasone weekly (40) (Kd) q28d  x 9 cycles / Dexamethasone D1,8,15     05/04/2021 PET scan   1. No evidence active multiple myeloma within the skeleton on FDG PET scan. 2. No suspicious lytic lesion on CT portion exam. 3. No plasmacytoma.   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HCC)  04/06/2015 Bone Marrow Biopsy   Accession: [id number] BM biopsy showed RAEB-1   04/06/2015 Tumor Marker   Cytogenetics and FISH for MDS are within normal limits   10/06/2015 - 10/10/2015 Chemotherapy   She received conditioning chemotherapy with busulfan and melphalan   10/12/2015 Bone Marrow Transplant   She received allogenic stem cell transplant   10/19/2015 Adverse Reaction   She developed posttransplant complication with mucositis, viral infection with rhinovirus, neutropenic fever, bilateral pleural effusion and moderate pericardial effusion and CMV reactivation.   10/31/2015 Miscellaneous   She has engrafted      PHYSICAL EXAMINATION: ECOG PERFORMANCE STATUS: 1 - Symptomatic but completely ambulatory  Vitals:   03/02/23 0900  BP: 131/66  Pulse: 66  Resp: 18  Temp: 97.9 F (36.6 C)  SpO2: 100%   Filed Weights   03/02/23 0900  Weight: 156 lb (70.8 kg)    GENERAL:alert, no distress and comfortable  LABORATORY DATA:  I have reviewed the data as listed    Component Value Date/Time   NA 140 03/02/2023 0834   NA 142 01/04/2017 0902   K 4.0 03/02/2023 0834   K 3.9 01/04/2017 0902   CL 107 03/02/2023 0834   CO2 26 03/02/2023 0834   CO2 28 01/04/2017 0902   GLUCOSE 87 03/02/2023 0834   GLUCOSE 92 01/04/2017 0902   BUN 16 03/02/2023 0834   BUN 9.5 01/04/2017 0902   CREATININE 0.79 03/02/2023 0834   CREATININE 0.76 09/26/2022 1256   CREATININE 0.7 01/04/2017 0902   CALCIUM 9.1 03/02/2023 0834   CALCIUM 9.0 01/04/2017 0902   PROT 6.5 03/02/2023 0834   PROT 6.0 (L) 01/04/2017 0902   PROT 5.8 (L) 01/04/2017 0902   ALBUMIN 3.8 03/02/2023 0834   ALBUMIN 3.4 (L) 01/04/2017 0902   AST 17 03/02/2023 0834   AST 9 (L) 09/26/2022 1256   AST 21 01/04/2017 0902   ALT 9 03/02/2023 0834   ALT 6 09/26/2022 1256   ALT 16 01/04/2017 0902   ALKPHOS 53 03/02/2023 0834   ALKPHOS 101 01/04/2017 0902   BILITOT 0.4 03/02/2023 0834   BILITOT 0.3 09/26/2022 1256   BILITOT 0.56 01/04/2017 0902   GFRNONAA >60 03/02/2023 0834   GFRNONAA >60 09/26/2022 1256   GFRAA >60 03/25/2020 0917   GFRAA >60 12/18/2018 0901    No results found for: "SPEP", "UPEP"  Lab Results  Component Value Date   WBC 2.9 (L) 03/02/2023   NEUTROABS 1.9 03/02/2023   HGB 10.3 (L) 03/02/2023   HCT 30.0 (L) 03/02/2023   MCV 116.3 (H) 03/02/2023   PLT 60 (L) 03/02/2023      Chemistry      Component Value Date/Time   NA 140 03/02/2023 0834   NA 142 01/04/2017 0902   K 4.0 03/02/2023 0834   K 3.9 01/04/2017 0902   CL 107 03/02/2023 0834   CO2 26 03/02/2023 0834   CO2 28 01/04/2017 0902   BUN 16 03/02/2023 0834    BUN 9.5 01/04/2017 0902   CREATININE 0.79 03/02/2023 0834   CREATININE 0.76 09/26/2022 1256   CREATININE 0.7 01/04/2017 0902  Component Value Date/Time   CALCIUM 9.1 03/02/2023 0834   CALCIUM 9.0 01/04/2017 0902   ALKPHOS 53 03/02/2023 0834   ALKPHOS 101 01/04/2017 0902   AST 17 03/02/2023 0834   AST 9 (L) 09/26/2022 1256   AST 21 01/04/2017 0902   ALT 9 03/02/2023 0834   ALT 6 09/26/2022 1256   ALT 16 01/04/2017 0902   BILITOT 0.4 03/02/2023 0834   BILITOT 0.3 09/26/2022 1256   BILITOT 0.56 01/04/2017 0902

## 2023-03-02 NOTE — Assessment & Plan Note (Signed)
She has history of acquired hypogammaglobulinemia and has been receiving IVIG The results that was drawn at [facility] was done approximately a week after IVIG treatment and of course he will be high I will repeat immunoglobulin levels next month and if it remains low, she will resume IVIG treatment

## 2023-03-02 NOTE — Telephone Encounter (Signed)
Called and left a message asking her to call the office back regarding bone density at [facility]. Will give her [facility] # to call [phone].

## 2023-03-02 NOTE — Telephone Encounter (Signed)
-----   Message from Artis Delay sent at 03/02/2023  9:11 AM EDT ----- Regarding: schedule bone density scan Pls schedule at [facility] I palced order

## 2023-03-02 NOTE — Assessment & Plan Note (Signed)
She has persistent pancytopenia but not symptomatic She does not need G-CSF support I will see her again next month for further follow-up

## 2023-03-02 NOTE — Assessment & Plan Note (Signed)
I have reviewed her outside records Currently, the patient is placed on observation I will bring her back next month for further follow-up I updated her medication list She will continue to take antimicrobial prophylaxis as directed by her physician at [facility]

## 2023-03-02 NOTE — Telephone Encounter (Signed)
Sent a my chart message.

## 2023-03-19 ENCOUNTER — Telehealth: Payer: Self-pay

## 2023-03-19 ENCOUNTER — Other Ambulatory Visit: Payer: Self-pay | Admitting: Hematology and Oncology

## 2023-03-19 DIAGNOSIS — D469 Myelodysplastic syndrome, unspecified: Secondary | ICD-10-CM

## 2023-03-19 NOTE — Telephone Encounter (Signed)
Returned her call. She is requesting that Dr. Bertis Ruddy order CD4-count with next lab appt for physician at [facility]. Per Dr. Bertis Ruddy she will order but Chantelle will have to get results to [facility].

## 2023-04-03 ENCOUNTER — Inpatient Hospital Stay (HOSPITAL_BASED_OUTPATIENT_CLINIC_OR_DEPARTMENT_OTHER): Payer: Medicare HMO | Admitting: Hematology and Oncology

## 2023-04-03 ENCOUNTER — Encounter: Payer: Self-pay | Admitting: Hematology and Oncology

## 2023-04-03 ENCOUNTER — Inpatient Hospital Stay: Payer: Medicare HMO | Attending: Hematology and Oncology

## 2023-04-03 VITALS — BP 133/60 | HR 77 | Temp 97.8°F | Resp 18 | Ht 62.0 in | Wt 157.4 lb

## 2023-04-03 DIAGNOSIS — C9002 Multiple myeloma in relapse: Secondary | ICD-10-CM

## 2023-04-03 DIAGNOSIS — Z9481 Bone marrow transplant status: Secondary | ICD-10-CM | POA: Insufficient documentation

## 2023-04-03 DIAGNOSIS — E559 Vitamin D deficiency, unspecified: Secondary | ICD-10-CM | POA: Diagnosis not present

## 2023-04-03 DIAGNOSIS — D6481 Anemia due to antineoplastic chemotherapy: Secondary | ICD-10-CM

## 2023-04-03 DIAGNOSIS — D801 Nonfamilial hypogammaglobulinemia: Secondary | ICD-10-CM | POA: Diagnosis not present

## 2023-04-03 DIAGNOSIS — Z9484 Stem cells transplant status: Secondary | ICD-10-CM | POA: Insufficient documentation

## 2023-04-03 DIAGNOSIS — Z9221 Personal history of antineoplastic chemotherapy: Secondary | ICD-10-CM | POA: Diagnosis not present

## 2023-04-03 DIAGNOSIS — D709 Neutropenia, unspecified: Secondary | ICD-10-CM | POA: Diagnosis not present

## 2023-04-03 DIAGNOSIS — C946 Myelodysplastic disease, not classified: Secondary | ICD-10-CM

## 2023-04-03 LAB — CBC WITH DIFFERENTIAL/PLATELET
Abs Immature Granulocytes: 0.01 10*3/uL (ref 0.00–0.07)
Basophils Absolute: 0 10*3/uL (ref 0.0–0.1)
Basophils Relative: 0 %
Eosinophils Absolute: 0 10*3/uL (ref 0.0–0.5)
Eosinophils Relative: 0 %
HCT: 32.5 % — ABNORMAL LOW (ref 36.0–46.0)
Hemoglobin: 10.8 g/dL — ABNORMAL LOW (ref 12.0–15.0)
Immature Granulocytes: 0 %
Lymphocytes Relative: 20 %
Lymphs Abs: 0.6 10*3/uL — ABNORMAL LOW (ref 0.7–4.0)
MCH: 37.6 pg — ABNORMAL HIGH (ref 26.0–34.0)
MCHC: 33.2 g/dL (ref 30.0–36.0)
MCV: 113.2 fL — ABNORMAL HIGH (ref 80.0–100.0)
Monocytes Absolute: 0.3 10*3/uL (ref 0.1–1.0)
Monocytes Relative: 10 %
Neutro Abs: 2.2 10*3/uL (ref 1.7–7.7)
Neutrophils Relative %: 70 %
Platelets: 100 10*3/uL — ABNORMAL LOW (ref 150–400)
RBC: 2.87 MIL/uL — ABNORMAL LOW (ref 3.87–5.11)
RDW: 12.1 % (ref 11.5–15.5)
WBC: 3.2 10*3/uL — ABNORMAL LOW (ref 4.0–10.5)
nRBC: 0 % (ref 0.0–0.2)

## 2023-04-03 LAB — COMPREHENSIVE METABOLIC PANEL
ALT: 11 U/L (ref 0–44)
AST: 16 U/L (ref 15–41)
Albumin: 4.1 g/dL (ref 3.5–5.0)
Alkaline Phosphatase: 75 U/L (ref 38–126)
Anion gap: 8 (ref 5–15)
BUN: 16 mg/dL (ref 8–23)
CO2: 26 mmol/L (ref 22–32)
Calcium: 9.4 mg/dL (ref 8.9–10.3)
Chloride: 106 mmol/L (ref 98–111)
Creatinine, Ser: 0.76 mg/dL (ref 0.44–1.00)
GFR, Estimated: >60 mL/min (ref >60)
Glucose, Bld: 124 mg/dL — ABNORMAL HIGH (ref 70–99)
Potassium: 3.8 mmol/L (ref 3.5–5.1)
Sodium: 140 mmol/L (ref 135–145)
Total Bilirubin: 0.3 mg/dL (ref 0.3–1.2)
Total Protein: 6.6 g/dL (ref 6.5–8.1)

## 2023-04-03 LAB — VITAMIN D 25 HYDROXY (VIT D DEFICIENCY, FRACTURES): Vit D, 25-Hydroxy: 24.83 ng/mL — ABNORMAL LOW (ref 30–100)

## 2023-04-03 LAB — T-HELPER CELLS (CD4) COUNT (NOT AT ARMC)
CD4 % Helper T Cell: 17 % — ABNORMAL LOW (ref 33–65)
CD4 T Cell Abs: 96 /uL — ABNORMAL LOW (ref 400–1790)

## 2023-04-03 NOTE — Progress Notes (Signed)
[facility] OFFICE PROGRESS NOTE  Patient Care Team: Default, Provider, MD as PCP - General Iran Ouch, MD as PCP - Cardiology (Cardiology) Astrid Drafts, MD as Referring Physician (Hematology and Oncology) Eddie Candle, MD as Consulting Physician (Internal Medicine) Daiva Eves, Lisette Grinder, MD as Consulting Physician (Infectious Diseases) Lorriane Shire An, MD as Consulting Physician (Hematology and Oncology) Sadie Haber, NP as Nurse Practitioner (Hematology and Oncology) Artis Delay, MD as Consulting Physician (Hematology and Oncology)  HISTORY OF PRESENTING ILLNESS: Discussed the use of AI scribe software for clinical note transcription with the patient, who gave verbal consent to proceed.  History of Present Illness   The patient, with a history of myeloma and receiving high-dose IVIG treatment, reports feeling generally tired and achy, particularly when walking. She admits to not exercising as much as she should, but has recently started walking a bit more. She has not yet completed a previously ordered bone density scan but plans to do so. She denies any signs of infection or fever.  She has not yet received her flu shot or COVID-19 vaccine, but plans to do so in the coming weeks.  The patient is also on Bactrim, which she has been advised to stop due to resolution of neutropenia. She is also taking supplemental vitamin D, and a vitamin D level test has been ordered to assess adequacy.  If IVIG is not needed, the patient will be on hold until further developments from [facility] perspective. She is also considering dental work and cataract surgery, but is not in a hurry for these procedures.         Assessment and Plan    Multiple Myeloma she is recovering from side-effects of treatment  Immunoglobulin levels pending to determine if she needs more IVIG in the future.  Neutropenia Resolved with ANC over 400. -Stop Bactrim.  Bone  Health Patient reports being achy when walking. Bone density scan ordered but not yet completed. -Encourage patient to complete bone density scan. -Check Vitamin D level. after dental clearance, will resume Zometa  General Health Maintenance -Follow up with [facility] team on November 11th for vaccine titers and myeloma labs.          No orders of the defined types were placed in this encounter.   All questions were answered. The patient knows to call the clinic with any problems, questions or concerns. The total time spent in the appointment was 20 minutes encounter with patients including review of chart and various tests results, discussions about plan of care and coordination of care plan   Artis Delay, MD 04/03/2023 10:21 AM  REVIEW OF SYSTEMS:   Constitutional: Denies fevers, chills or abnormal weight loss Eyes: Denies blurriness of vision Ears, nose, mouth, throat, and face: Denies mucositis or sore throat Respiratory: Denies cough, dyspnea or wheezes Cardiovascular: Denies palpitation, chest discomfort or lower extremity swelling Gastrointestinal:  Denies nausea, heartburn or change in bowel habits Skin: Denies abnormal skin rashes Lymphatics: Denies new lymphadenopathy or easy bruising Neurological:Denies numbness, tingling or new weaknesses Behavioral/Psych: Mood is stable, no new changes  All other systems were reviewed with the patient and are negative.  I have reviewed the past medical history, past surgical history, social history and family history with the patient and they are unchanged from previous note.  ALLERGIES:  has No Known Allergies.  MEDICATIONS:  Current Outpatient Medications  Medication Sig Dispense Refill   acyclovir (ZOVIRAX) 400 MG tablet Take 1 tablet (400 mg  total) by mouth 2 (two) times daily. 180 tablet 1   atovaquone (MEPRON) 750 MG/5ML suspension Take 1,500 mg by mouth 2 (two) times daily.     cholecalciferol (VITAMIN D3) 25 MCG (1000 UNIT)  tablet Take 2,000 Units by mouth daily.     fluconazole (DIFLUCAN) 200 MG tablet Take 400 mg by mouth 2 (two) times daily.     levofloxacin (LEVAQUIN) 500 MG tablet Take 500 mg by mouth daily.     metoprolol tartrate (LOPRESSOR) 25 MG tablet TAKE 1 & 1/2 TABLET BY MOUTH TWICE A DAY 270 tablet 0   mirtazapine (REMERON) 15 MG tablet Take 1 tablet (15 mg total) by mouth at bedtime. 90 tablet 1   Multiple Vitamins-Minerals (PRESERVISION AREDS 2 PO) Take 1 tablet by mouth 2 (two) times daily.     ondansetron (ZOFRAN) 8 MG tablet Take 1 tablet (8 mg total) by mouth every 8 (eight) hours as needed (Nausea or vomiting). (Patient taking differently: Take 8 mg by mouth 2 (two) times daily.) 60 tablet 1   pantoprazole (PROTONIX) 40 MG tablet Take 1 tablet (40 mg total) by mouth daily.     prochlorperazine (COMPAZINE) 5 MG tablet Take 5 mg by mouth every 6 (six) hours as needed for nausea or vomiting.     senna-docusate (SENOKOT-S) 8.6-50 MG tablet Take 2 tablets by mouth daily as needed.     No current facility-administered medications for this visit.    SUMMARY OF ONCOLOGIC HISTORY: Oncology History Overview Note  Multiple myeloma, Ig A Lambda, M spike 3.54 grams, Calcium 9.2, Creatinine 0.8, Beta 2 microglobulin 4.52, IgA 4840 mg/dL, lambda light chain 16.1, albumin 3.6, hemoglobin 9.7, platelet 115    Primary site: Multiple Myeloma   Staging method: AJCC 6th Edition   Clinical: Stage IIA signed by Artis Delay, MD on 11/07/2013  2:46 PM   Summary: Stage IIA S/p Allo transplant at [facility] on revlimid, pomalyst, daratumumab and velcade     Multiple myeloma in relapse (HCC)  10/31/2013 Bone Marrow Biopsy   Bone marrow biopsy confirmed multiple myeloma with 40% bone marrow involvement. Skeletal survey showed minimal lesions in her score with generalized demineralization   11/10/2013 - 02/13/2014 Chemotherapy   The patient is started on induction chemotherapy with weekly dexamethasone 40 mg by  mouth as well as Velcade subcutaneous injection on days 1, 4, 8 and 11. On 11/21/2013, she was started on monthly Zometa.   12/23/2013 Adverse Reaction   The dose of Velcade was reduced due to thrombocytopenia.   01/28/2014 - 04/07/2014 Chemotherapy   Revlimid is added. Treatment was discontinued due to lack of response.   02/24/2014 - 04/07/2014 Chemotherapy   Due to worsening peripheral neuropathy, Velcade injection is changed to once a week. Revlimid was given 21 days on, 7 days off.   04/07/2014 - 04/10/2014 Chemotherapy   Revlimid was discontinued due to lack of response. Chemotherapy was changed back to Velcade injection twice a week, 2 weeks on 1 week off. Her treatment was switched to to minimum response   04/20/2014 - 06/02/2014 Chemotherapy   chemotherapy is switched to Carfilzomib, Cytoxan and dexamethasone.   04/22/2014 Procedure   she has placement of port for chemotherapy.   06/01/2014 Tumor Marker   Bloodwork show that she has greater than partial response   06/23/2014 Bone Marrow Biopsy   Bone marrow biopsy show 5-10% residual plasma cells, normal cytogenetics and FISH   07/07/2014 Procedure   She had stem cell collection  07/22/2014 - 07/22/2014 Chemotherapy   She had high-dose chemotherapy with melphalan   07/23/2014 Bone Marrow Transplant   She had bone marrow transplant in autologous fashion at [facility]   10/20/2014 - 03/24/2015 Chemotherapy    she received chemotherapy with Kyprolis, Revlimid and dexamethasone   10/22/2014 Procedure   She has port placement   01/19/2015 Tumor Marker   IgA lambda M spike at 0.4 g    01/20/2015 Miscellaneous   IVIG monthly was added for recurrent infections   02/02/2015 Miscellaneous   She received GCSF for severe neutropenia   02/26/2015 Bone Marrow Biopsy    she had bone marrow biopsy done at [facility] which showed mild pancytopenia but not diagnostic for myelodysplastic syndrome or multiple myeloma   07/22/2015 - 09/21/2015 Chemotherapy    She is receiving Daratumumab at [facility] due to relapsed myeloma   08/03/2015 - 08/06/2015 Hospital Admission   She was admitted to the hospital for neutropenic fever. No cource was found and fever resolved with IV vancomycin and meropenem   09/13/2015 Bone Marrow Biopsy   Bone marrow biopsy showed no increased blasts, 3-4 % plasma cells   03/02/2016 Bone Marrow Biopsy   Bone marrow biopsy at [facility] showed normocellular (30%) bone marrow with trilineage hematopoiesis. No significant increase in blasts. No significant increase in plasma cells.   05/12/2016 Imaging   DEXA scan at [facility] showed osteopenia   10/24/2016 Imaging   Skeletal survey at [facility], no new lesions   12/07/2017 Imaging   No focal abnormality noted to suggest myeloma. Exam is stable from prior exam.   03/01/2018 Procedure   Successful 8 French right internal jugular vein power port placement with its tip at the SVC/RA junction.   03/06/2018 -  Chemotherapy   The patient had daratumumab    07/09/2018 Bone Marrow Biopsy   Bone marrow biopsy at [facility] showed residual disease at 0.004% plasma cells   07/03/2019 Bone Marrow Biopsy   A. Bone marrow, flow cytometric analysis for multiple myeloma minimal residual disease detection:   Negative. No phenotypically abnormal plasma cells at or above the limit of detection identified.     No monotypic B-cell population identified. Negative for increased blasts.   03/25/2021 Bone Marrow Biopsy   Repeat bone marrow biopsy at [facility] showed female donor karyotype.  5 to 7% plasma cell is seen.  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   04/20/2021 Echocardiogram   1. Left ventricular ejection fraction, by estimation, is 60 to 65%. The left ventricle has normal function. The left ventricle has no regional wall motion abnormalities. Left  ventricular diastolic parameters were normal.  2. Right ventricular systolic function is normal. The right ventricular size is normal.  3. The mitral valve is normal in structure. No evidence of mitral valve regurgitation. No evidence of mitral stenosis.  4. The aortic valve is normal in structure. Aortic valve regurgitation is not visualized. No aortic stenosis is present.  5. The inferior vena cava is normal in size with greater than 50% respiratory variability, suggesting right atrial pressure of 3 mmHg.   04/29/2021 - 02/17/2022 Chemotherapy   Patient is on Treatment Plan : MYELOMA RELAPSED/ REFRACTORY Carfilzomib D1,8,15 (20/27) + Dexamethasone (KPd) q28d     04/30/2021 - 09/28/2022 Chemotherapy   Patient is on Treatment Plan : MYELOMA RELAPSED/REFRACTORY Carfilzomib (20/70) D1,8,15 + Dexamethasone  weekly (40) (Kd) q28d  x 9 cycles / Dexamethasone D1,8,15     05/04/2021 PET scan   1. No evidence active multiple myeloma within the skeleton on FDG PET scan. 2. No suspicious lytic lesion on CT portion exam. 3. No plasmacytoma.   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HCC)  04/06/2015 Bone Marrow Biopsy   Accession: [id number] BM biopsy showed RAEB-1   04/06/2015 Tumor Marker   Cytogenetics and FISH for MDS are within normal limits   10/06/2015 - 10/10/2015 Chemotherapy   She received conditioning chemotherapy with busulfan and melphalan   10/12/2015 Bone Marrow Transplant   She received allogenic stem cell transplant   10/19/2015 Adverse Reaction   She developed posttransplant complication with mucositis, viral infection with rhinovirus, neutropenic fever, bilateral pleural effusion and moderate pericardial effusion and CMV reactivation.   10/31/2015 Miscellaneous   She has engrafted     PHYSICAL EXAMINATION: ECOG PERFORMANCE STATUS: 0 - Asymptomatic  Vitals:   04/03/23 0900  BP: 133/60  Pulse: 77  Resp: 18  Temp: 97.8 F (36.6 C)  SpO2: 100%   Filed Weights    04/03/23 0900  Weight: 157 lb 6.4 oz (71.4 kg)    GENERAL:alert, no distress and comfortable   LABORATORY DATA:  I have reviewed the data as listed    Component Value Date/Time   NA 140 04/03/2023 0833   NA 142 01/04/2017 0902   K 3.8 04/03/2023 0833   K 3.9 01/04/2017 0902   CL 106 04/03/2023 0833   CO2 26 04/03/2023 0833   CO2 28 01/04/2017 0902   GLUCOSE 124 (H) 04/03/2023 0833   GLUCOSE 92 01/04/2017 0902   BUN 16 04/03/2023 0833   BUN 9.5 01/04/2017 0902   CREATININE 0.76 04/03/2023 0833   CREATININE 0.76 09/26/2022 1256   CREATININE 0.7 01/04/2017 0902   CALCIUM 9.4 04/03/2023 0833   CALCIUM 9.0 01/04/2017 0902   PROT 6.6 04/03/2023 0833   PROT 6.0 (L) 01/04/2017 0902   PROT 5.8 (L) 01/04/2017 0902   ALBUMIN 4.1 04/03/2023 0833   ALBUMIN 3.4 (L) 01/04/2017 0902   AST 16 04/03/2023 0833   AST 9 (L) 09/26/2022 1256   AST 21 01/04/2017 0902   ALT 11 04/03/2023 0833   ALT 6 09/26/2022 1256   ALT 16 01/04/2017 0902   ALKPHOS 75 04/03/2023 0833   ALKPHOS 101 01/04/2017 0902   BILITOT 0.3 04/03/2023 0833   BILITOT 0.3 09/26/2022 1256   BILITOT 0.56 01/04/2017 0902   GFRNONAA >60 04/03/2023 0833   GFRNONAA >60 09/26/2022 1256   GFRAA >60 03/25/2020 0917   GFRAA >60 12/18/2018 0901    No results found for: "SPEP", "UPEP"  Lab Results  Component Value Date   WBC 3.2 (L) 04/03/2023   NEUTROABS 2.2 04/03/2023   HGB 10.8 (L) 04/03/2023   HCT 32.5 (L) 04/03/2023   MCV 113.2 (H) 04/03/2023   PLT 100 (L) 04/03/2023      Chemistry      Component Value Date/Time   NA 140 04/03/2023 0833   NA 142 01/04/2017 0902   K 3.8 04/03/2023 0833   K 3.9 01/04/2017 0902   CL 106 04/03/2023 0833   CO2 26 04/03/2023 0833   CO2 28 01/04/2017 0902   BUN 16 04/03/2023 0833   BUN 9.5 01/04/2017 0902   CREATININE 0.76 04/03/2023 0833   CREATININE 0.76 09/26/2022 1256   CREATININE 0.7 01/04/2017 0902      Component Value Date/Time   CALCIUM 9.4 04/03/2023 3086  CALCIUM  9.0 01/04/2017 0902   ALKPHOS 75 04/03/2023 0833   ALKPHOS 101 01/04/2017 0902   AST 16 04/03/2023 0833   AST 9 (L) 09/26/2022 1256   AST 21 01/04/2017 0902   ALT 11 04/03/2023 0833   ALT 6 09/26/2022 1256   ALT 16 01/04/2017 0902   BILITOT 0.3 04/03/2023 0833   BILITOT 0.3 09/26/2022 1256   BILITOT 0.56 01/04/2017 0902

## 2023-04-04 LAB — KAPPA/LAMBDA LIGHT CHAINS
Kappa free light chain: 7.6 mg/L (ref 3.3–19.4)
Kappa, lambda light chain ratio: 0.67 (ref 0.26–1.65)
Lambda free light chains: 11.4 mg/L (ref 5.7–26.3)

## 2023-04-05 LAB — MULTIPLE MYELOMA PANEL, SERUM
Albumin SerPl Elph-Mcnc: 3.3 g/dL (ref 2.9–4.4)
Albumin/Glob SerPl: 1.4 (ref 0.7–1.7)
Alpha 1: 0.2 g/dL (ref 0.0–0.4)
Alpha2 Glob SerPl Elph-Mcnc: 0.7 g/dL (ref 0.4–1.0)
B-Globulin SerPl Elph-Mcnc: 0.9 g/dL (ref 0.7–1.3)
Gamma Glob SerPl Elph-Mcnc: 0.5 g/dL (ref 0.4–1.8)
Globulin, Total: 2.4 g/dL (ref 2.2–3.9)
IgA: 11 mg/dL — ABNORMAL LOW (ref 64–422)
IgG (Immunoglobin G), Serum: 572 mg/dL — ABNORMAL LOW (ref 586–1602)
IgM (Immunoglobulin M), Srm: 5 mg/dL — ABNORMAL LOW (ref 26–217)
Total Protein ELP: 5.7 g/dL — ABNORMAL LOW (ref 6.0–8.5)

## 2023-04-06 ENCOUNTER — Telehealth: Payer: Self-pay

## 2023-04-06 ENCOUNTER — Other Ambulatory Visit: Payer: Self-pay | Admitting: Internal Medicine

## 2023-04-06 NOTE — Telephone Encounter (Signed)
-----   Message from [employer] sent at 04/06/2023  8:07 AM EDT ----- Pls call her M protein not detectable No role for IVIG, her IGG level is over 400 She has low vitamin D level, double her current dose of vitamin D and update her med list Thanks

## 2023-04-06 NOTE — Telephone Encounter (Signed)
Called and given below message. She verbalized understanding and appreciated the call.

## 2023-04-07 ENCOUNTER — Other Ambulatory Visit: Payer: Self-pay | Admitting: Medical

## 2023-04-09 ENCOUNTER — Telehealth: Payer: Self-pay

## 2023-04-09 NOTE — Telephone Encounter (Signed)
Returned her call and unable to leave a message. Faxed order per her request for bone density at [facility]. Called [phone] at [facility] imaging. Faxed order to [phone], received fax confirmation.

## 2023-04-09 NOTE — Telephone Encounter (Signed)
She called back and is aware of below that order for bone density has been faxed to [facility].

## 2023-04-09 NOTE — Telephone Encounter (Signed)
last visit on 08/24/22 with plan to f/u in 6 months, please schedule.

## 2023-05-29 ENCOUNTER — Telehealth: Payer: Self-pay

## 2023-05-29 ENCOUNTER — Other Ambulatory Visit: Payer: Self-pay | Admitting: Hematology and Oncology

## 2023-05-29 NOTE — Telephone Encounter (Signed)
It will not be next week I will send LOS for 2 weeks

## 2023-05-29 NOTE — Telephone Encounter (Signed)
She called and left a message. She needs to see Dr. Bertis Ruddy and have labs. She recently followed up with [facility]. She needs to get IVIG again. She is not available on 12/11 due to prior appts.

## 2023-05-29 NOTE — Telephone Encounter (Signed)
Called and given below message. She verbalized understanding and will wait on the scheduler to call her.

## 2023-05-31 ENCOUNTER — Telehealth: Payer: Self-pay | Admitting: Hematology and Oncology

## 2023-05-31 NOTE — Telephone Encounter (Signed)
Spoke with patient confirming upcoming appointment  

## 2023-06-08 ENCOUNTER — Telehealth: Payer: Self-pay

## 2023-06-08 NOTE — Telephone Encounter (Signed)
I do not mind signing handicap placard Did her physicians request these extra tests? I do not plan to draw them if they do not request them

## 2023-06-08 NOTE — Telephone Encounter (Signed)
Called and told Jean Davidson to get labs repeated at [facility] if needed. She verbalized understanding.

## 2023-06-08 NOTE — Telephone Encounter (Signed)
She called and left a message about 12/17 appt. She needs a handicap placard signed at visit. She is asking if Dr. Bertis Ruddy can order Sedimentation rate, Ferritin and Disseminated Intravascular coagulation labs at that visit. She had those labs at [facility] on 11/11.

## 2023-06-08 NOTE — Telephone Encounter (Signed)
When she called earlier she said that she wanted to see if they had improved and was concerned.

## 2023-06-08 NOTE — Telephone Encounter (Signed)
She needs to get the labs followed at [facility]

## 2023-06-12 ENCOUNTER — Inpatient Hospital Stay: Payer: Medicare HMO | Attending: Hematology and Oncology

## 2023-06-12 ENCOUNTER — Inpatient Hospital Stay: Payer: Medicare HMO

## 2023-06-12 ENCOUNTER — Inpatient Hospital Stay (HOSPITAL_BASED_OUTPATIENT_CLINIC_OR_DEPARTMENT_OTHER): Payer: Medicare HMO | Admitting: Hematology and Oncology

## 2023-06-12 ENCOUNTER — Encounter: Payer: Self-pay | Admitting: Hematology and Oncology

## 2023-06-12 VITALS — BP 153/71 | HR 72 | Temp 97.8°F | Resp 16

## 2023-06-12 VITALS — BP 135/81 | HR 58 | Temp 98.0°F | Resp 18 | Ht 62.0 in | Wt 163.8 lb

## 2023-06-12 DIAGNOSIS — C9002 Multiple myeloma in relapse: Secondary | ICD-10-CM

## 2023-06-12 DIAGNOSIS — D61818 Other pancytopenia: Secondary | ICD-10-CM | POA: Diagnosis not present

## 2023-06-12 DIAGNOSIS — Z9481 Bone marrow transplant status: Secondary | ICD-10-CM | POA: Diagnosis not present

## 2023-06-12 DIAGNOSIS — D801 Nonfamilial hypogammaglobulinemia: Secondary | ICD-10-CM

## 2023-06-12 DIAGNOSIS — R5381 Other malaise: Secondary | ICD-10-CM

## 2023-06-12 DIAGNOSIS — T451X5A Adverse effect of antineoplastic and immunosuppressive drugs, initial encounter: Secondary | ICD-10-CM

## 2023-06-12 DIAGNOSIS — Z9484 Stem cells transplant status: Secondary | ICD-10-CM | POA: Insufficient documentation

## 2023-06-12 DIAGNOSIS — C946 Myelodysplastic disease, not classified: Secondary | ICD-10-CM

## 2023-06-12 DIAGNOSIS — G620 Drug-induced polyneuropathy: Secondary | ICD-10-CM

## 2023-06-12 LAB — CBC WITH DIFFERENTIAL/PLATELET
Abs Immature Granulocytes: 0.01 10*3/uL (ref 0.00–0.07)
Basophils Absolute: 0 10*3/uL (ref 0.0–0.1)
Basophils Relative: 0 %
Eosinophils Absolute: 0 10*3/uL (ref 0.0–0.5)
Eosinophils Relative: 0 %
HCT: 35 % — ABNORMAL LOW (ref 36.0–46.0)
Hemoglobin: 11.5 g/dL — ABNORMAL LOW (ref 12.0–15.0)
Immature Granulocytes: 0 %
Lymphocytes Relative: 24 %
Lymphs Abs: 0.9 10*3/uL (ref 0.7–4.0)
MCH: 34.8 pg — ABNORMAL HIGH (ref 26.0–34.0)
MCHC: 32.9 g/dL (ref 30.0–36.0)
MCV: 106.1 fL — ABNORMAL HIGH (ref 80.0–100.0)
Monocytes Absolute: 0.4 10*3/uL (ref 0.1–1.0)
Monocytes Relative: 11 %
Neutro Abs: 2.5 10*3/uL (ref 1.7–7.7)
Neutrophils Relative %: 65 %
Platelets: 131 10*3/uL — ABNORMAL LOW (ref 150–400)
RBC: 3.3 MIL/uL — ABNORMAL LOW (ref 3.87–5.11)
RDW: 12.3 % (ref 11.5–15.5)
WBC: 3.8 10*3/uL — ABNORMAL LOW (ref 4.0–10.5)
nRBC: 0 % (ref 0.0–0.2)

## 2023-06-12 LAB — COMPREHENSIVE METABOLIC PANEL
ALT: 11 U/L (ref 0–44)
AST: 15 U/L (ref 15–41)
Albumin: 3.9 g/dL (ref 3.5–5.0)
Alkaline Phosphatase: 67 U/L (ref 38–126)
Anion gap: 8 (ref 5–15)
BUN: 25 mg/dL — ABNORMAL HIGH (ref 8–23)
CO2: 25 mmol/L (ref 22–32)
Calcium: 9 mg/dL (ref 8.9–10.3)
Chloride: 109 mmol/L (ref 98–111)
Creatinine, Ser: 0.83 mg/dL (ref 0.44–1.00)
GFR, Estimated: >60 mL/min (ref >60)
Glucose, Bld: 124 mg/dL — ABNORMAL HIGH (ref 70–99)
Potassium: 4 mmol/L (ref 3.5–5.1)
Sodium: 142 mmol/L (ref 135–145)
Total Bilirubin: 0.3 mg/dL (ref <1.2)
Total Protein: 5.7 g/dL — ABNORMAL LOW (ref 6.5–8.1)

## 2023-06-12 MED ORDER — FAMOTIDINE IN NACL 20-0.9 MG/50ML-% IV SOLN
20.0000 mg | Freq: Once | INTRAVENOUS | Status: AC
  Administered 2023-06-12: 20 mg via INTRAVENOUS
  Filled 2023-06-12: qty 50

## 2023-06-12 MED ORDER — DEXTROSE 5 % IV SOLN: INTRAVENOUS | Status: DC

## 2023-06-12 MED ORDER — ACETAMINOPHEN 325 MG PO TABS
650.0000 mg | ORAL_TABLET | Freq: Once | ORAL | Status: AC
Start: 2023-06-12 — End: 2023-06-12
  Administered 2023-06-12: 650 mg via ORAL
  Filled 2023-06-12: qty 2

## 2023-06-12 MED ORDER — METHYLPREDNISOLONE SODIUM SUCC 40 MG IJ SOLR
40.0000 mg | Freq: Every day | INTRAMUSCULAR | Status: DC
Start: 2023-06-12 — End: 2023-06-12
  Filled 2023-06-12: qty 1

## 2023-06-12 MED ORDER — IMMUNE GLOBULIN (HUMAN) 10 GM/100ML IV SOLN
1.0000 g/kg | Freq: Once | INTRAVENOUS | Status: AC
  Administered 2023-06-12: 75 g via INTRAVENOUS
  Filled 2023-06-12: qty 750

## 2023-06-12 MED ORDER — METHYLPREDNISOLONE SODIUM SUCC 40 MG IJ SOLR
40.0000 mg | Freq: Once | INTRAMUSCULAR | Status: AC
Start: 2023-06-12 — End: 2023-06-12
  Administered 2023-06-12: 40 mg via INTRAVENOUS

## 2023-06-12 NOTE — Progress Notes (Signed)
Patient declined to stay for full 30 minute post IVIG observation. Vitals stable, patient in no distress upon leaving infusion room.

## 2023-06-12 NOTE — Progress Notes (Signed)
[facility] OFFICE PROGRESS NOTE  Patient Care Team: Default, Provider, MD as PCP - General Kirke Corin Chelsea Aus, MD as PCP - Cardiology (Cardiology) Astrid Drafts, MD as Referring Physician (Hematology and Oncology) Eddie Candle, MD as Consulting Physician (Internal Medicine) Daiva Eves, Lisette Grinder, MD as Consulting Physician (Infectious Diseases) Lorriane Shire An, MD as Consulting Physician (Hematology and Oncology) Sadie Haber, NP as Nurse Practitioner (Hematology and Oncology) Artis Delay, MD as Consulting Physician (Hematology and Oncology)  ASSESSMENT & PLAN:  Multiple myeloma in relapse [facility]) We will continue close monitoring of her myeloma panel monthly when she returns here for IVIG treatment Continue supportive care for now and she will remain on acyclovir  Pancytopenia, acquired (HCC) Her blood counts are stable  Physical deconditioning She has significant deconditioning I signed her application for disability parking placard I will also refer her to physical therapy and rehab  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  She will resume IVIG treatment  Orders Placed This Encounter  Procedures   Kappa/lambda light chains    Standing Status:   Standing    Number of Occurrences:   22    Expiration Date:   06/11/2024   Multiple Myeloma Panel (SPEP&IFE w/QIG)    Standing Status:   Standing    Number of Occurrences:   22    Expiration Date:   06/11/2024   Ambulatory referral to Physical Therapy    Referral Priority:   Routine    Referral Type:   Physical Medicine    Referral Reason:   Specialty Services Required    Requested Specialty:   Physical Therapy    Number of Visits Requested:   1    All questions were answered. The patient knows to call the clinic with any problems, questions or concerns. The total time spent in the appointment was 30 minutes encounter with patients including review of  chart and various tests results, discussions about plan of care and coordination of care plan   Artis Delay, MD 06/12/2023 9:50 AM  INTERVAL HISTORY: Please see below for problem oriented charting. she returns for IVIG treatment I have reviewed documentation from [facility] The patient requested physical therapy referral as well as disability parking placard She denies recent infection  REVIEW OF SYSTEMS:   Constitutional: Denies fevers, chills or abnormal weight loss Eyes: Denies blurriness of vision Ears, nose, mouth, throat, and face: Denies mucositis or sore throat Respiratory: Denies cough, dyspnea or wheezes Cardiovascular: Denies palpitation, chest discomfort or lower extremity swelling Gastrointestinal:  Denies nausea, heartburn or change in bowel habits Skin: Denies abnormal skin rashes Lymphatics: Denies new lymphadenopathy or easy bruising Neurological:Denies numbness, tingling or new weaknesses Behavioral/Psych: Mood is stable, no new changes  All other systems were reviewed with the patient and are negative.  I have reviewed the past medical history, past surgical history, social history and family history with the patient and they are unchanged from previous note.  ALLERGIES:  has no known allergies.  MEDICATIONS:  Current Outpatient Medications  Medication Sig Dispense Refill   acyclovir (ZOVIRAX) 400 MG tablet Take 1 tablet (400 mg total) by mouth 2 (two) times daily. 180 tablet 1   atovaquone (MEPRON) 750 MG/5ML suspension Take 1,500 mg by mouth 2 (two) times daily.     cholecalciferol (VITAMIN D3) 25 MCG (1000 UNIT) tablet Take 4,000 Units by mouth daily. Instructed to increase to 4,000 units daily     metoprolol tartrate (LOPRESSOR) 25 MG tablet TAKE 1 &  1/2 TABLET BY MOUTH TWICE A DAY--due for follow up appointment.  PLEASE CALL OFFICE TO SCHEDULE APPOINTMENT PRIOR TO NEXT REFILL 270 tablet 0   mirtazapine (REMERON) 15 MG tablet Take 1 tablet (15 mg total) by mouth at  bedtime. 90 tablet 1   Multiple Vitamins-Minerals (PRESERVISION AREDS 2 PO) Take 1 tablet by mouth 2 (two) times daily.     ondansetron (ZOFRAN) 8 MG tablet Take 1 tablet (8 mg total) by mouth every 8 (eight) hours as needed (Nausea or vomiting). (Patient taking differently: Take 8 mg by mouth 2 (two) times daily.) 60 tablet 1   pantoprazole (PROTONIX) 40 MG tablet Take 1 tablet (40 mg total) by mouth daily.     prochlorperazine (COMPAZINE) 5 MG tablet Take 5 mg by mouth every 6 (six) hours as needed for nausea or vomiting.     senna-docusate (SENOKOT-S) 8.6-50 MG tablet Take 2 tablets by mouth daily as needed.     No current facility-administered medications for this visit.   Facility-Administered Medications Ordered in Other Visits  Medication Dose Route Frequency Provider Last Rate Last Admin   dextrose 5 % solution   Intravenous Continuous Artis Delay, MD 20 mL/hr at 06/12/23 0850 New Bag at 06/12/23 0850   Immune Globulin 10% (PRIVIGEN) IV infusion 75 g  1 g/kg Intravenous Once Artis Delay, MD        SUMMARY OF ONCOLOGIC HISTORY: Oncology History Overview Note  Multiple myeloma, Ig A Lambda, M spike 3.54 grams, Calcium 9.2, Creatinine 0.8, Beta 2 microglobulin 4.52, IgA 4840 mg/dL, lambda light chain 46.9, albumin 3.6, hemoglobin 9.7, platelet 115    Primary site: Multiple Myeloma   Staging method: AJCC 6th Edition   Clinical: Stage IIA signed by Artis Delay, MD on 11/07/2013  2:46 PM   Summary: Stage IIA S/p Allo transplant at [facility] on revlimid, pomalyst, daratumumab and velcade     Multiple myeloma in relapse (HCC)  10/31/2013 Bone Marrow Biopsy   Bone marrow biopsy confirmed multiple myeloma with 40% bone marrow involvement. Skeletal survey showed minimal lesions in her score with generalized demineralization   11/10/2013 - 02/13/2014 Chemotherapy   The patient is started on induction chemotherapy with weekly dexamethasone 40 mg by mouth as well as Velcade subcutaneous  injection on days 1, 4, 8 and 11. On 11/21/2013, she was started on monthly Zometa.   12/23/2013 Adverse Reaction   The dose of Velcade was reduced due to thrombocytopenia.   01/28/2014 - 04/07/2014 Chemotherapy   Revlimid is added. Treatment was discontinued due to lack of response.   02/24/2014 - 04/07/2014 Chemotherapy   Due to worsening peripheral neuropathy, Velcade injection is changed to once a week. Revlimid was given 21 days on, 7 days off.   04/07/2014 - 04/10/2014 Chemotherapy   Revlimid was discontinued due to lack of response. Chemotherapy was changed back to Velcade injection twice a week, 2 weeks on 1 week off. Her treatment was switched to to minimum response   04/20/2014 - 06/02/2014 Chemotherapy   chemotherapy is switched to Carfilzomib, Cytoxan and dexamethasone.   04/22/2014 Procedure   she has placement of port for chemotherapy.   06/01/2014 Tumor Marker   Bloodwork show that she has greater than partial response   06/23/2014 Bone Marrow Biopsy   Bone marrow biopsy show 5-10% residual plasma cells, normal cytogenetics and FISH   07/07/2014 Procedure   She had stem cell collection   07/22/2014 - 07/22/2014 Chemotherapy   She had high-dose chemotherapy with melphalan  07/23/2014 Bone Marrow Transplant   She had bone marrow transplant in autologous fashion at [facility]   10/20/2014 - 03/24/2015 Chemotherapy    she received chemotherapy with Kyprolis, Revlimid and dexamethasone   10/22/2014 Procedure   She has port placement   01/19/2015 Tumor Marker   IgA lambda M spike at 0.4 g    01/20/2015 Miscellaneous   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 No cource was found and fever resolved with IV vancomycin and meropenem   09/13/2015 Bone Marrow Biopsy   Bone marrow biopsy showed no increased blasts, 3-4 % plasma cells   03/02/2016 Bone Marrow Biopsy   Bone marrow biopsy at [facility] showed normocellular (30%) bone marrow with trilineage hematopoiesis. No significant increase in blasts. No significant increase in plasma cells.   05/12/2016 Imaging   DEXA scan at [facility] showed osteopenia   10/24/2016 Imaging   Skeletal survey at [facility], no new lesions   12/07/2017 Imaging   No focal abnormality noted to suggest myeloma. Exam is stable from prior exam.   03/01/2018 Procedure   Successful 8 French right internal jugular vein power port placement with its tip at the SVC/RA junction.   03/06/2018 -  Chemotherapy   The patient had daratumumab    07/09/2018 Bone Marrow Biopsy   Bone marrow biopsy at [facility] showed residual disease at 0.004% plasma cells   07/03/2019 Bone Marrow Biopsy   A. Bone marrow, flow cytometric analysis for multiple myeloma minimal residual disease detection:   Negative. No phenotypically abnormal plasma cells at or above the limit of detection identified.     No monotypic B-cell population identified. Negative for increased blasts.   03/25/2021 Bone Marrow Biopsy   Repeat bone marrow biopsy at [facility] showed female donor karyotype.  5 to 7% plasma cell is seen.  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   04/20/2021 Echocardiogram   1. Left ventricular ejection fraction, by estimation, is 60 to 65%. The left ventricle has normal function. The left ventricle has no regional wall motion abnormalities. Left ventricular diastolic parameters were normal.   2. Right ventricular systolic function is normal. The right ventricular size is normal.  3. The mitral valve is normal in structure. No evidence of mitral valve regurgitation. No evidence of mitral stenosis.  4. The aortic valve is normal in structure. Aortic valve regurgitation is not visualized. No aortic stenosis is present.  5. The inferior vena cava is normal in size with greater than 50% respiratory variability, suggesting right atrial pressure of 3 mmHg.   04/29/2021 - 02/17/2022 Chemotherapy   Patient is on Treatment Plan : MYELOMA RELAPSED/ REFRACTORY Carfilzomib D1,8,15 (20/27) + Dexamethasone (KPd) q28d     04/30/2021 - 09/28/2022 Chemotherapy   Patient is on Treatment Plan : MYELOMA RELAPSED/REFRACTORY Carfilzomib (20/70) D1,8,15 + Dexamethasone weekly (40) (Kd) q28d  x 9 cycles / Dexamethasone D1,8,15  05/04/2021 PET scan   1. No evidence active multiple myeloma within the skeleton on FDG PET scan. 2. No suspicious lytic lesion on CT portion exam. 3. No plasmacytoma.   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HCC)  04/06/2015 Bone Marrow Biopsy   Accession: [id number] BM biopsy showed RAEB-1   04/06/2015 Tumor Marker   Cytogenetics and FISH for MDS are within normal limits   10/06/2015 - 10/10/2015 Chemotherapy   She received conditioning chemotherapy with busulfan and melphalan   10/12/2015 Bone Marrow Transplant   She received allogenic stem cell transplant   10/19/2015 Adverse Reaction   She developed posttransplant complication with mucositis, viral infection with rhinovirus, neutropenic fever, bilateral pleural effusion and moderate pericardial effusion and CMV reactivation.   10/31/2015 Miscellaneous   She has engrafted     PHYSICAL EXAMINATION: ECOG PERFORMANCE STATUS: 1 - Symptomatic but completely ambulatory  Vitals:   06/12/23 0829  BP: 135/81  Pulse: (!) 58  Resp: 18  Temp: 98 F (36.7 C)  SpO2: 97%   Filed Weights   06/12/23 0829   Weight: 163 lb 12.8 oz (74.3 kg)    GENERAL:alert, no distress and comfortable  NEURO: alert & oriented x 3 with fluent speech, no focal motor/sensory deficits  LABORATORY DATA:  I have reviewed the data as listed    Component Value Date/Time   NA 142 06/12/2023 0723   NA 142 01/04/2017 0902   K 4.0 06/12/2023 0723   K 3.9 01/04/2017 0902   CL 109 06/12/2023 0723   CO2 25 06/12/2023 0723   CO2 28 01/04/2017 0902   GLUCOSE 124 (H) 06/12/2023 0723   GLUCOSE 92 01/04/2017 0902   BUN 25 (H) 06/12/2023 0723   BUN 9.5 01/04/2017 0902   CREATININE 0.83 06/12/2023 0723   CREATININE 0.76 09/26/2022 1256   CREATININE 0.7 01/04/2017 0902   CALCIUM 9.0 06/12/2023 0723   CALCIUM 9.0 01/04/2017 0902   PROT 5.7 (L) 06/12/2023 0723   PROT 6.0 (L) 01/04/2017 0902   PROT 5.8 (L) 01/04/2017 0902   ALBUMIN 3.9 06/12/2023 0723   ALBUMIN 3.4 (L) 01/04/2017 0902   AST 15 06/12/2023 0723   AST 9 (L) 09/26/2022 1256   AST 21 01/04/2017 0902   ALT 11 06/12/2023 0723   ALT 6 09/26/2022 1256   ALT 16 01/04/2017 0902   ALKPHOS 67 06/12/2023 0723   ALKPHOS 101 01/04/2017 0902   BILITOT 0.3 06/12/2023 0723   BILITOT 0.3 09/26/2022 1256   BILITOT 0.56 01/04/2017 0902   GFRNONAA >60 06/12/2023 0723   GFRNONAA >60 09/26/2022 1256   GFRAA >60 03/25/2020 0917   GFRAA >60 12/18/2018 0901    No results found for: "SPEP", "UPEP"  Lab Results  Component Value Date   WBC 3.8 (L) 06/12/2023   NEUTROABS 2.5 06/12/2023   HGB 11.5 (L) 06/12/2023   HCT 35.0 (L) 06/12/2023   MCV 106.1 (H) 06/12/2023   PLT 131 (L) 06/12/2023      Chemistry      Component Value Date/Time   NA 142 06/12/2023 0723   NA 142 01/04/2017 0902   K 4.0 06/12/2023 0723   K 3.9 01/04/2017 0902   CL 109 06/12/2023 0723   CO2 25 06/12/2023 0723   CO2 28 01/04/2017 0902   BUN 25 (H) 06/12/2023 0723   BUN 9.5 01/04/2017 0902   CREATININE 0.83 06/12/2023 0723   CREATININE 0.76 09/26/2022 1256   CREATININE 0.7 01/04/2017  0902      Component Value Date/Time  CALCIUM 9.0 06/12/2023 0723   CALCIUM 9.0 01/04/2017 0902   ALKPHOS 67 06/12/2023 0723   ALKPHOS 101 01/04/2017 0902   AST 15 06/12/2023 0723   AST 9 (L) 09/26/2022 1256   AST 21 01/04/2017 0902   ALT 11 06/12/2023 0723   ALT 6 09/26/2022 1256   ALT 16 01/04/2017 0902   BILITOT 0.3 06/12/2023 0723   BILITOT 0.3 09/26/2022 1256   BILITOT 0.56 01/04/2017 0902

## 2023-06-12 NOTE — Assessment & Plan Note (Signed)
She has significant deconditioning I signed her application for disability parking placard I will also refer her to physical therapy and rehab

## 2023-06-12 NOTE — Assessment & Plan Note (Signed)
Her blood counts are stable.

## 2023-06-12 NOTE — Assessment & Plan Note (Signed)
We will continue close monitoring of her myeloma panel monthly when she returns here for IVIG treatment Continue supportive care for now and she will remain on acyclovir

## 2023-06-12 NOTE — Assessment & Plan Note (Signed)
She has acquired hypogammaglobulinemia  She will resume IVIG treatment

## 2023-06-13 ENCOUNTER — Telehealth: Payer: Self-pay | Admitting: Hematology and Oncology

## 2023-06-13 ENCOUNTER — Telehealth: Payer: Self-pay | Admitting: *Deleted

## 2023-06-13 LAB — KAPPA/LAMBDA LIGHT CHAINS
Kappa free light chain: 12.2 mg/L (ref 3.3–19.4)
Kappa, lambda light chain ratio: 0.49 (ref 0.26–1.65)
Lambda free light chains: 24.8 mg/L (ref 5.7–26.3)

## 2023-06-13 NOTE — Telephone Encounter (Signed)
Pt called to confirm appt at [facility] facility for PT. Advised pt that it is a consult with PT to evaluate needs for PT in which Dr.Gorsuch referred pt for. Pt had concerns for location but advised that facilities will provide same services. Pt verbalized understanding.

## 2023-06-13 NOTE — Telephone Encounter (Signed)
Spoke with patient confirming upcoming appointment  

## 2023-06-14 NOTE — Therapy (Signed)
OUTPATIENT PHYSICAL THERAPY ONCOLOGY EVALUATION  Patient Name: Jean Davidson MRN: [id number] DOB:1946/02/01, 77 y.o., female Today's Date: 06/15/2023  END OF SESSION:  PT End of Session - 06/15/23 1013     Visit Number 1    Number of Visits 12    Date for PT Re-Evaluation 07/27/23    PT Start Time 0905    PT Stop Time 1004    PT Time Calculation (min) 59 min    Equipment Utilized During Treatment Gait belt    Activity Tolerance Patient tolerated treatment well    Behavior During Therapy WFL for tasks assessed/performed             Past Medical History:  Diagnosis Date   Anemia    Anxiety    Blood transfusion without reported diagnosis    Carotid stenosis    Mild bilateral   Carpal tunnel syndrome    Cataract    Cervical disc disease    c7   Cough 07/27/2015   Disc degeneration    Dysuria 07/26/2015   Failure of stem cell transplant (HCC)    Fatigue 01/26/2015   Fever 07/27/2015   GERD (gastroesophageal reflux disease)    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   Multiple myeloma (HCC)    Osteopenia    Pinched nerve    Sinusitis, bacterial 07/27/2015   Spinal stenosis in cervical region    Varicose veins of lower extremities with inflammation    Past Surgical History:  Procedure Laterality Date   ANKLE SURGERY Left    BLADDER SUSPENSION  1988/1989   x2   COLONOSCOPY     ESOPHAGOGASTRODUODENOSCOPY     IR IMAGING GUIDED PORT INSERTION  03/01/2018   LIMBAL STEM CELL TRANSPLANT     x2   PORT-A-CATH REMOVAL     SHOULDER SURGERY Right 04/06/2013   right   UPPER GASTROINTESTINAL ENDOSCOPY     Patient Active Problem List   Diagnosis Date Noted   Closed left ankle fracture 06/16/2022   Fluid overload 04/17/2022   Anemia due to antineoplastic  chemotherapy 06/24/2021   Sinusitis 06/13/2021   Neutropenic fever (HCC) 04/05/2021   Hyperglycemia, drug-induced 07/29/2020   Financial difficulties 05/27/2020   Bilateral cataracts 12/04/2019   Headache disorder 09/11/2019   Weight gain 09/11/2019   Skin lesion of back 08/07/2019   Pelvic floor dysfunction in female 07/09/2019   Alopecia 07/09/2019   Squamous cell cancer of skin of left temple 06/04/2019   Exertional shortness of breath 02/14/2019   Vitamin B12 deficiency 09/25/2018   Large hiatal hernia 07/26/2018   Belching 07/26/2018   Early satiety 07/26/2018   URI (upper respiratory infection) 06/25/2018   Lung nodule seen on imaging study 06/25/2018   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/11/2018   Tinnitus of both ears 04/10/2018   Preventive measure 04/10/2018   Goals of care, counseling/discussion 02/20/2018   Pain of left scapula 12/06/2017   Vitamin D deficiency 11/12/2017   Yeast infection of the skin 09/06/2017   Family hx of colon cancer requiring screening colonoscopy 01/17/2017   Cystocele and rectocele with incomplete uterovaginal prolapse 11/22/2016   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   Bilateral pleural effusion 01/25/2016   Pericardial effusion 01/24/2016   Physical deconditioning 01/11/2016  S/P autologous bone marrow transplantation (HCC) 11/08/2015   Therapeutic drug monitoring 11/08/2015   Red blood cell antibody positive, compatible PRBC difficult to obtain 07/30/2015   Cough 07/27/2015   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   Fatigue 01/26/2015   Essential hypertension 01/26/2015   Other fatigue 01/26/2015   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/2015   Chronic recurrent sinusitis 10/23/2014   Conjunctivitis 10/23/2014   Status post allogeneic bone marrow transplant (HCC) 08/14/2014   Leukopenia due to antineoplastic chemotherapy (HCC) 08/10/2014   Patient in clinical research study  07/21/2014   Need for prophylactic vaccination and inoculation against influenza 02/24/2014   Diarrhea 02/03/2014   Leukocytosis 02/03/2014   Constipation 11/21/2013   Rash 11/21/2013   Back pain 11/21/2013   Anxiety 11/21/2013   Multiple myeloma in relapse (HCC) 11/07/2013   Pancytopenia, acquired (HCC) 10/29/2013   Thrombocytopenia (HCC) 10/29/2013   Persistent cough for 3 weeks or longer 10/02/2013   Abnormal weight loss 10/02/2013   Normocytic anemia 08/22/2013   Abdominal bloating 08/22/2013   Hypersensitivity 08/07/2013   Other malaise and fatigue 08/07/2013   Osteopenia 08/05/2013   GERD (gastroesophageal reflux disease) 01/16/2013   Insomnia disorder 01/16/2013   Carotid stenosis    Cervical disc disease    Hypertension 04/15/2012   Palpitations 03/29/2012    PCP: Lorine Bears, MD  REFERRING PROVIDER: Artis Delay, MD  REFERRING DIAG: Deconditioning  THERAPY DIAG:  Multiple myeloma in relapse (HCC)  History of bone marrow transplant (HCC)  Chemotherapy-induced neuropathy (HCC)  Physical deconditioning  ONSET DATE: 1 year ago  Rationale for Evaluation and Treatment: Rehabilitation  SUBJECTIVE:                                                                                                                                                                                           SUBJECTIVE STATEMENT:  I am so achy in my legs the longer I move or stand.. It improves if I move around. I don't feel sure footed due to neuropathy from chemo. I wobble when I walk.I want to do more. I want to be able to sit on the floor for my 41 year old granddaughter. Balance is a concern. I can get up and down from chairs but I do it slowly and I use my arms.  Right lateral thigh feels very tight and has been since my Left ankle fx. I have fallen and broken my wrist and ring finger  and have a right reverse TSA.(2014). I don't meet my 5,000 step goal. I Feel tight in both calves. I  have trouble getting out of my car because of  leg (  ITB) pain on right, and difficulty putting on my socks.  PERTINENT HISTORY:  Pt has a hx of Multiple Myeloma initially diagnosed in 2015. Underwent chemotherapy with several different ones due to side effects. 07/23/2014 underwent a Bone Marrow Transplant at [facility] She has had multiple bone marrow biopsies over the years and changes of chemotherapy. She received an allogenic stem cell transplant in April 2017. She is currently on high dose IVIG treatment.  Had a CAR-T in  [facility] April. She is very deconditioned. It is just in the marrow, and not in the bone. Fx left ankle last year and had ORIF in Nov. 23.  PAIN:  Are you having pain? Yes NPRS scale: 0-5-6/10 achiness in legs Pain location: legs, getting out of car, feet get cold and numb Pain orientation: Bilateral  PAIN TYPE: aching Pain description: intermittent  Aggravating factors: standing, walking Relieving factors: sitting down  PRECAUTIONS: Multiple Myeloma, macular degeneration, peripheral neuropathy, myelodisplastic syndrome,carotid stenosis(mild), Right reverse TSA, left ankle ORIF  WEIGHT BEARING RESTRICTIONS: No  FALLS:  Has patient fallen in last 6 months? No, but is at risk  LIVING ENVIRONMENT: Lives with: lives alone Lives in: House/apartment  OCCUPATION: retired Engineer, site  LEISURE: arts and Presenter, broadcasting,  HAND DOMINANCE: left   PRIOR LEVEL OF FUNCTION: Independent  PATIENT GOALS: Reduce achiness, improve confidence, improve balance,   OBJECTIVE: Note: Objective measures were completed at Evaluation unless otherwise noted.  COGNITION: Overall cognitive status: Within functional limits for tasks assessed   PALPATION: Very tender full length of bilateral ITB  OBSERVATIONS / OTHER ASSESSMENTS:    SENSATION: Light touch: deficits bilateral feet  POSTURE: forward head, rounded shoulders  UPPER EXTREMITY AROM/PROM:  A/PROM RIGHT   eval    Shoulder extension   Shoulder flexion   Shoulder abduction   Shoulder internal rotation   Shoulder external rotation     (Blank rows = not tested)  A/PROM LEFT   eval  Shoulder extension   Shoulder flexion   Shoulder abduction   Shoulder internal rotation   Shoulder external rotation     (Blank rows = not tested)  CERVICAL AROM: NT      UPPER EXTREMITY STRENGTH:    LOWER EXTREMITY AROM/PROM:  MMT Right eval  Hip flexion 4+  Hip extension Can bridge  Hip abduction 4+/5 sit  Hip adduction 4+/5 sit  Hip internal rotation 4  Hip external rotation 4+  Knee flexion 5  Knee extension 4  Ankle dorsiflexion 5  Ankle plantarflexion 5/5  Ankle inversion 4+  Ankle eversion 4+   (Blank rows = not tested)  MMT LEFT eval  Hip flexion 4  Hip extension Can bridge  Hip abduction 4+ sit  Hip adduction 4+ sit  Hip internal rotation 4  Hip external rotation 4+  Knee flexion 5  Knee extension 4  Ankle dorsiflexion 4+  Ankle plantarflexion 4  Ankle inversion 4  Ankle eversion 3+   (Blank rows = not tested)  LOWER EXTREMITY FLEXIBILITY; HS Right 48, Left 53, Very tight ITB and piriformis B, Bilateral gastroc  FUNCTIONAL TESTS:  30 sec sit to stand:  8 using hands at knees (below avg for age)  4 position balance test able to maintain 10 sec, some sway with tandem stance, left greater than right SLS  Able to turn full circle in both directions without LOB, able to stand with eyes closed 20 seconds feet slightly apart   TUG; 16 seconds, short steps, toe out, decreased heel strike.  GAIT: Distance walked: 40 Assistive device utilized: None Level of assistance: SBA Comments: short ,choppy steps, decreased heel strike, toe out   LOWER EXTREMITY FUNCTIONAL SCALE; 22   TODAY'S TREATMENT:                                                                                                                                         DATE: 06/15/2023 Discussed POC, visit  number, treatment interventions. Will include aquatic therapy but not to start and only if she wants to. Set timer for 6O min and get up to move around  PATIENT EDUCATION:  Education details: POC, visit number, treatment interventions. Will include aquatic therapy but not to start and only if she wants to Person educated: Patient Education method: Explanation Education comprehension: verbalized understanding  HOME EXERCISE PROGRAM: Set alarm for 60 min to get up and walk around house  ASSESSMENT:  CLINICAL IMPRESSION: Patient is a 77 y.o. female who was seen today for physical therapy evaluation and treatment for general deconditioning and balance deficits related to her treatments for Multiple Myeloma. She presents with altered gait, using short choppy strides and decreased heel strike/toe off. She has decreased LE flexibility impacting her functional activities such as getting out of the car and donning socks and shoes. She requires use of her arms for rising from chairs, and she scores very low on the LEFS at 22. She will benefit from skilled PT to address deficits and return to PLOF  OBJECTIVE IMPAIRMENTS: Abnormal gait, decreased activity tolerance, decreased balance, decreased strength, impaired flexibility, impaired sensation, postural dysfunction, and pain.   ACTIVITY LIMITATIONS: carrying, bending, standing, squatting, stairs, and locomotion level  PARTICIPATION LIMITATIONS: cleaning, shopping, and community activity  PERSONAL FACTORS: Age and 1-2 comorbidities: Multiple Myeloma and current treatments  are also affecting patient's functional outcome.   REHAB POTENTIAL: Good  CLINICAL DECISION MAKING: Stable/uncomplicated  EVALUATION COMPLEXITY: Low  GOALS: Goals reviewed with patient? Yes  SHORT TERM GOALS: Target date: 07/12/2022  Pt will be independent with HEP to improve flexibility and strength Baseline: Goal status: INITIAL  2.  Pt will perform TUG in 13 seconds to  decrease fall risk Baseline:  Goal status: INITIAL  3.  Pt will be able to perform  9 sit to stands in 30 seconds for average rating for her age Baseline: 8 Goal status: INITIAL  4.  Pt will have decreased right ITB pain by 25% Baseline:  Goal status: INITIAL  5.  Pt will ambulate with improved stride length and heel strike for more normal gait Baseline Goal status: INITIAL   LONG TERM GOALS: Target date: 08/09/2022  Pt will be able to don socks with improved ease Baseline:  Goal status: INITIAL  2.  Pt will have decreased right ITB pain by 50% or greater Baseline:  Goal status: INITIAL  3.  Pt will be able to get out of her car  without right ITB pain Baseline:  Goal status: INITIAL  4.  LEFS will be increased to 40 to demonstrate improved function Baseline:  Goal status: INITIAL  5.  Pt will perform TUG in 11 seconds  or less to demonstrate decreased fall risk Baseline:  Goal status: INITIAL  6.  Pt will perform SLS for 14 seconds on each side to achieve age related norms Baseline:  Goal status: INITIAL  PLAN:  PT FREQUENCY: 2x/week  PT DURATION: 8 weeks  PLANNED INTERVENTIONS: 97164- PT Re-evaluation, 97110-Therapeutic exercises, 97530- Therapeutic activity, 97112- Neuromuscular re-education, 97535- Self Care, 16109- Manual therapy, [phone]- Gait training, and [phone]- Aquatic Therapy  PLAN FOR NEXT SESSION: HS, ITB, gastroc stretches, rolling to bilateral ITB, strength, balance; HEP; heel raises, sit to stand, stretches etc   Waynette Buttery, PT 06/15/2023, 10:14 AM

## 2023-06-15 ENCOUNTER — Ambulatory Visit: Payer: Medicare HMO | Attending: Hematology and Oncology

## 2023-06-15 ENCOUNTER — Other Ambulatory Visit: Payer: Self-pay

## 2023-06-15 DIAGNOSIS — T451X5A Adverse effect of antineoplastic and immunosuppressive drugs, initial encounter: Secondary | ICD-10-CM | POA: Insufficient documentation

## 2023-06-15 DIAGNOSIS — Z9481 Bone marrow transplant status: Secondary | ICD-10-CM | POA: Insufficient documentation

## 2023-06-15 DIAGNOSIS — C9002 Multiple myeloma in relapse: Secondary | ICD-10-CM | POA: Diagnosis present

## 2023-06-15 DIAGNOSIS — R2681 Unsteadiness on feet: Secondary | ICD-10-CM | POA: Insufficient documentation

## 2023-06-15 DIAGNOSIS — G620 Drug-induced polyneuropathy: Secondary | ICD-10-CM | POA: Insufficient documentation

## 2023-06-15 DIAGNOSIS — R5381 Other malaise: Secondary | ICD-10-CM | POA: Insufficient documentation

## 2023-06-24 LAB — MULTIPLE MYELOMA PANEL, SERUM
Albumin SerPl Elph-Mcnc: 3.5 g/dL (ref 2.9–4.4)
Albumin/Glob SerPl: 1.5 (ref 0.7–1.7)
Alpha 1: 0.3 g/dL (ref 0.0–0.4)
Alpha2 Glob SerPl Elph-Mcnc: 0.8 g/dL (ref 0.4–1.0)
B-Globulin SerPl Elph-Mcnc: 0.9 g/dL (ref 0.7–1.3)
Gamma Glob SerPl Elph-Mcnc: 0.4 g/dL (ref 0.4–1.8)
Globulin, Total: 2.4 g/dL (ref 2.2–3.9)
IgA: 39 mg/dL — ABNORMAL LOW (ref 64–422)
IgG (Immunoglobin G), Serum: 331 mg/dL — ABNORMAL LOW (ref 586–1602)
IgM (Immunoglobulin M), Srm: 18 mg/dL — ABNORMAL LOW (ref 26–217)
Total Protein ELP: 5.9 g/dL — ABNORMAL LOW (ref 6.0–8.5)

## 2023-06-26 ENCOUNTER — Ambulatory Visit: Payer: Medicare HMO | Admitting: Rehabilitation

## 2023-07-02 ENCOUNTER — Ambulatory Visit: Payer: Medicare HMO

## 2023-07-05 ENCOUNTER — Ambulatory Visit: Payer: Medicare HMO | Attending: Hematology and Oncology

## 2023-07-05 DIAGNOSIS — G620 Drug-induced polyneuropathy: Secondary | ICD-10-CM | POA: Diagnosis present

## 2023-07-05 DIAGNOSIS — C9002 Multiple myeloma in relapse: Secondary | ICD-10-CM | POA: Diagnosis present

## 2023-07-05 DIAGNOSIS — Z9481 Bone marrow transplant status: Secondary | ICD-10-CM | POA: Insufficient documentation

## 2023-07-05 DIAGNOSIS — R2681 Unsteadiness on feet: Secondary | ICD-10-CM | POA: Diagnosis present

## 2023-07-05 DIAGNOSIS — T451X5A Adverse effect of antineoplastic and immunosuppressive drugs, initial encounter: Secondary | ICD-10-CM | POA: Insufficient documentation

## 2023-07-05 DIAGNOSIS — R5381 Other malaise: Secondary | ICD-10-CM | POA: Insufficient documentation

## 2023-07-05 NOTE — Therapy (Signed)
 OUTPATIENT PHYSICAL THERAPY ONCOLOGY TREATMENT  Patient Name: CLORIA CIRESI MRN: [id number] DOB:1945-11-28, 78 y.o., female Today's Date: 07/05/2023  END OF SESSION:  PT End of Session - 07/05/23 0954     Visit Number 2    Number of Visits 12    Date for PT Re-Evaluation 08/10/23    PT Start Time 1000    PT Stop Time 1054    PT Time Calculation (min) 54 min    Equipment Utilized During Treatment Gait belt    Activity Tolerance Patient tolerated treatment well    Behavior During Therapy WFL for tasks assessed/performed             Past Medical History:  Diagnosis Date   Anemia    Anxiety    Blood transfusion without reported diagnosis    Carotid stenosis    Mild bilateral   Carpal tunnel syndrome    Cataract    Cervical disc disease    c7   Cough 07/27/2015   Disc degeneration    Dysuria 07/26/2015   Failure of stem cell transplant (HCC)    Fatigue 01/26/2015   Fever 07/27/2015   GERD (gastroesophageal reflux disease)    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   Multiple myeloma (HCC)    Osteopenia    Pinched nerve    Sinusitis, bacterial 07/27/2015   Spinal stenosis in cervical region    Varicose veins of lower extremities with inflammation    Past Surgical History:  Procedure Laterality Date   ANKLE SURGERY Left    BLADDER SUSPENSION  1988/1989   x2   COLONOSCOPY     ESOPHAGOGASTRODUODENOSCOPY     IR IMAGING GUIDED PORT INSERTION  03/01/2018   LIMBAL STEM CELL TRANSPLANT     x2   PORT-A-CATH REMOVAL     SHOULDER SURGERY Right 04/06/2013   right   UPPER GASTROINTESTINAL ENDOSCOPY     Patient Active Problem List   Diagnosis Date Noted   Closed left ankle fracture 06/16/2022   Fluid overload 04/17/2022   Anemia due to antineoplastic  chemotherapy 06/24/2021   Sinusitis 06/13/2021   Neutropenic fever (HCC) 04/05/2021   Hyperglycemia, drug-induced 07/29/2020   Financial difficulties 05/27/2020   Bilateral cataracts 12/04/2019   Headache disorder 09/11/2019   Weight gain 09/11/2019   Skin lesion of back 08/07/2019   Pelvic floor dysfunction in female 07/09/2019   Alopecia 07/09/2019   Squamous cell cancer of skin of left temple 06/04/2019   Exertional shortness of breath 02/14/2019   Vitamin B12 deficiency 09/25/2018   Large hiatal hernia 07/26/2018   Belching 07/26/2018   Early satiety 07/26/2018   URI (upper respiratory infection) 06/25/2018   Lung nodule seen on imaging study 06/25/2018   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/11/2018   Tinnitus of both ears 04/10/2018   Preventive measure 04/10/2018   Goals of care, counseling/discussion 02/20/2018   Pain of left scapula 12/06/2017   Vitamin D  deficiency 11/12/2017   Yeast infection of the skin 09/06/2017   Family hx of colon cancer requiring screening colonoscopy 01/17/2017   Cystocele and rectocele with incomplete uterovaginal prolapse 11/22/2016   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   Bilateral pleural effusion 01/25/2016   Pericardial effusion 01/24/2016   Physical deconditioning 01/11/2016  S/P autologous bone marrow transplantation (HCC) 11/08/2015   Therapeutic drug monitoring 11/08/2015   Red blood cell antibody positive, compatible PRBC difficult to obtain 07/30/2015   Cough 07/27/2015   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   Fatigue 01/26/2015   Essential hypertension 01/26/2015   Other fatigue 01/26/2015   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/2015   Chronic recurrent sinusitis 10/23/2014   Conjunctivitis 10/23/2014   Status post allogeneic bone marrow transplant (HCC) 08/14/2014   Leukopenia due to antineoplastic chemotherapy (HCC) 08/10/2014   Patient in clinical research study  07/21/2014   Need for prophylactic vaccination and inoculation against influenza 02/24/2014   Diarrhea 02/03/2014   Leukocytosis 02/03/2014   Constipation 11/21/2013   Rash 11/21/2013   Back pain 11/21/2013   Anxiety 11/21/2013   Multiple myeloma in relapse (HCC) 11/07/2013   Pancytopenia, acquired (HCC) 10/29/2013   Thrombocytopenia (HCC) 10/29/2013   Persistent cough for 3 weeks or longer 10/02/2013   Abnormal weight loss 10/02/2013   Normocytic anemia 08/22/2013   Abdominal bloating 08/22/2013   Hypersensitivity 08/07/2013   Other malaise and fatigue 08/07/2013   Osteopenia 08/05/2013   GERD (gastroesophageal reflux disease) 01/16/2013   Insomnia disorder 01/16/2013   Carotid stenosis    Cervical disc disease    Hypertension 04/15/2012   Palpitations 03/29/2012    PCP: Deatrice Cage, MD  REFERRING PROVIDER: Almarie Bedford, MD  REFERRING DIAG: Deconditioning  THERAPY DIAG:  Multiple myeloma in relapse (HCC)  History of bone marrow transplant (HCC)  Chemotherapy-induced neuropathy (HCC)  Physical deconditioning  Gait instability  ONSET DATE: 1 year ago  Rationale for Evaluation and Treatment: Rehabilitation  SUBJECTIVE:                                                                                                                                                                                           SUBJECTIVE STATEMENT:  I hIave missed several weeks due to a bad cold. I feel better now. I can get in and out of the car now without problems. I had the throw ups and the runs yesterday. I got on the floor 3 days over christmas to play with my grand daughter, and I used my sofa to get up.  EVAL I am so achy in my legs the longer I move or stand.. It improves if I move around. I don't feel sure footed due to neuropathy from chemo. I wobble when I walk.I want to do more. I want to be able to sit on the floor for my 54 year old granddaughter. Balance is a concern. I can  get up and down from chairs but I do it  slowly and I use my arms.  Right lateral thigh feels very tight and has been since my Left ankle fx. I have fallen and broken my wrist and ring finger  and have a right reverse TSA.(2014). I don't meet my 5,000 step goal. I Feel tight in both calves. I have trouble getting out of my car because of  leg (ITB) pain on right, and difficulty putting on my socks.  PERTINENT HISTORY:  Pt has a hx of Multiple Myeloma initially diagnosed in 2015. Underwent chemotherapy with several different ones due to side effects. 07/23/2014 underwent a Bone Marrow Transplant at [facility] She has had multiple bone marrow biopsies over the years and changes of chemotherapy. She received an allogenic stem cell transplant in April 2017. She is currently on high dose IVIG treatment.  Had a CAR-T in  [facility] April. She is very deconditioned. It is just in the marrow, and not in the bone. Fx left ankle last year and had ORIF in Nov. 23.  PAIN:  Are you having pain? Yes NPRS scale: 0-2-3/10 achiness in ankles Pain location: legs, getting out of car, feet get cold and numb Pain orientation: Bilateral  PAIN TYPE: aching Pain description: intermittent  Aggravating factors: standing, walking Relieving factors: sitting down  PRECAUTIONS: Multiple Myeloma, macular degeneration, peripheral neuropathy, myelodisplastic syndrome,carotid stenosis(mild), Right reverse TSA, left ankle ORIF  WEIGHT BEARING RESTRICTIONS: No  FALLS:  Has patient fallen in last 6 months? No, but is at risk  LIVING ENVIRONMENT: Lives with: lives alone Lives in: House/apartment  OCCUPATION: retired engineer, site  LEISURE: arts and presenter, broadcasting,  HAND DOMINANCE: left   PRIOR LEVEL OF FUNCTION: Independent  PATIENT GOALS: Reduce achiness, improve confidence, improve balance,   OBJECTIVE: Note: Objective measures were completed at Evaluation unless otherwise noted.  COGNITION: Overall cognitive status:  Within functional limits for tasks assessed   PALPATION: Very tender full length of bilateral ITB  OBSERVATIONS / OTHER ASSESSMENTS:    SENSATION: Light touch: deficits bilateral feet  POSTURE: forward head, rounded shoulders  UPPER EXTREMITY AROM/PROM:  A/PROM RIGHT   eval   Shoulder extension   Shoulder flexion   Shoulder abduction   Shoulder internal rotation   Shoulder external rotation     (Blank rows = not tested)  A/PROM LEFT   eval  Shoulder extension   Shoulder flexion   Shoulder abduction   Shoulder internal rotation   Shoulder external rotation     (Blank rows = not tested)  CERVICAL AROM: NT      UPPER EXTREMITY STRENGTH:    LOWER EXTREMITY AROM/PROM:  MMT Right eval  Hip flexion 4+  Hip extension Can bridge  Hip abduction 4+/5 sit  Hip adduction 4+/5 sit  Hip internal rotation 4  Hip external rotation 4+  Knee flexion 5  Knee extension 4  Ankle dorsiflexion 5  Ankle plantarflexion 5/5  Ankle inversion 4+  Ankle eversion 4+   (Blank rows = not tested)  MMT LEFT eval  Hip flexion 4  Hip extension Can bridge  Hip abduction 4+ sit  Hip adduction 4+ sit  Hip internal rotation 4  Hip external rotation 4+  Knee flexion 5  Knee extension 4  Ankle dorsiflexion 4+  Ankle plantarflexion 4  Ankle inversion 4  Ankle eversion 3+   (Blank rows = not tested)  LOWER EXTREMITY FLEXIBILITY; HS Right 48, Left 53, Very tight ITB and piriformis B, Bilateral gastroc  FUNCTIONAL TESTS:  30 sec sit to stand:  8 using hands at knees (below avg for age)  4 position balance test able to maintain 10 sec, some sway with tandem stance, left greater than right SLS  Able to turn full circle in both directions without LOB, able to stand with eyes closed 20 seconds feet slightly apart   TUG; 16 seconds, short steps, toe out, decreased heel strike.  GAIT: Distance walked: 40 Assistive device utilized: None Level of assistance: SBA Comments: short  ,choppy steps, decreased heel strike, toe out   LOWER EXTREMITY FUNCTIONAL SCALE; 22   TODAY'S TREATMENT:                                                                                                                                         DATE:   07/04/2022 Nu Step seat 7, UE 8, Lev 5 to warm up Piriformis stretch x 3 B supine x 20 sec HS stretch with strap x 3 B 30 sec Gastroc stretch with strap in sitting x 3 Bilateral ITB rolling x 4 min ea in sidelying Updated HEP with pics 06/15/2023 Discussed POC, visit number, treatment interventions. Will include aquatic therapy but not to start and only if she wants to. Set timer for 6O min and get up to move around  PATIENT EDUCATION:  Access Code: [email or web address] URL: [email or web address] Date: 07/05/2023 Prepared by: Grayce Sheldon  Exercises - Supine Hamstring Stretch with Strap  - 1 x daily - 7 x weekly - 1 sets - 3 reps - 20-30 hold - Supine Hip External Rotation Stretch  - 1 x daily - 7 x weekly - 3 sets - 3 reps - 20-30 hold - Seated Calf Stretch with Strap  - 1 x daily - 7 x weekly - 1 sets - 3 reps - 20-30 hold - Supine ITB Stretch with Strap  - 1 x daily - 7 x weekly - 1 sets - 3 reps - 20 hold Education details: POC, visit number, treatment interventions. Will include aquatic therapy but not to start and only if she wants to Person educated: Patient Education method: Explanation Education comprehension: verbalized understanding  HOME EXERCISE PROGRAM: Set alarm for 60 min to get up and walk around house  ASSESSMENT:  CLINICAL IMPRESSION: Initiated LE flexibility exs bilateral, and ITB rolling bilaterally. She has been able to get in and out of her car easier more recently, and has less ITB pain. She continues to walk with choppy gait and will progress to balance/gait next visit  OBJECTIVE IMPAIRMENTS: Abnormal gait, decreased activity tolerance, decreased balance, decreased strength, impaired flexibility,  impaired sensation, postural dysfunction, and pain.   ACTIVITY LIMITATIONS: carrying, bending, standing, squatting, stairs, and locomotion level  PARTICIPATION LIMITATIONS: cleaning, shopping, and community activity  PERSONAL FACTORS: Age and 1-2 comorbidities: Multiple Myeloma and current treatments  are also affecting patient's functional outcome.   REHAB POTENTIAL: Good  CLINICAL DECISION MAKING: Stable/uncomplicated  EVALUATION COMPLEXITY:  Low  GOALS: Goals reviewed with patient? Yes  SHORT TERM GOALS: Target date: 07/12/2022  Pt will be independent with HEP to improve flexibility and strength Baseline: Goal status: INITIAL  2.  Pt will perform TUG in 13 seconds to decrease fall risk Baseline:  Goal status: INITIAL  3.  Pt will be able to perform  9 sit to stands in 30 seconds for average rating for her age Baseline: 8 Goal status: INITIAL  4.  Pt will have decreased right ITB pain by 25% Baseline:  Goal status: INITIAL  5.  Pt will ambulate with improved stride length and heel strike for more normal gait Baseline Goal status: INITIAL   LONG TERM GOALS: Target date: 08/09/2022  Pt will be able to don socks with improved ease Baseline:  Goal status: INITIAL  2.  Pt will have decreased right ITB pain by 50% or greater Baseline:  Goal status: INITIAL  3.  Pt will be able to get out of her car without right ITB pain Baseline:  Goal status: INITIAL  4.  LEFS will be increased to 40 to demonstrate improved function Baseline:  Goal status: INITIAL  5.  Pt will perform TUG in 11 seconds  or less to demonstrate decreased fall risk Baseline:  Goal status: INITIAL  6.  Pt will perform SLS for 14 seconds on each side to achieve age related norms Baseline:  Goal status: INITIAL  PLAN:  PT FREQUENCY: 2x/week  PT DURATION: 8 weeks  PLANNED INTERVENTIONS: 97164- PT Re-evaluation, 97110-Therapeutic exercises, 97530- Therapeutic activity, 97112- Neuromuscular  re-education, 97535- Self Care, [id number]- Manual therapy, [phone]- Gait training, and [phone]- Aquatic Therapy  PLAN FOR NEXT SESSION:  ITB stretches;already gave pics, rolling to bilateral ITB, strength, balance; HEP; heel raises, sit to stand, stretches etc   Grayce JINNY Sheldon, PT 07/05/2023, 10:57 AM

## 2023-07-09 ENCOUNTER — Ambulatory Visit: Payer: Medicare HMO

## 2023-07-09 DIAGNOSIS — C9002 Multiple myeloma in relapse: Secondary | ICD-10-CM | POA: Diagnosis not present

## 2023-07-09 DIAGNOSIS — R2681 Unsteadiness on feet: Secondary | ICD-10-CM

## 2023-07-09 DIAGNOSIS — Z9481 Bone marrow transplant status: Secondary | ICD-10-CM

## 2023-07-09 DIAGNOSIS — R5381 Other malaise: Secondary | ICD-10-CM

## 2023-07-09 DIAGNOSIS — G620 Drug-induced polyneuropathy: Secondary | ICD-10-CM

## 2023-07-09 NOTE — Therapy (Signed)
 OUTPATIENT PHYSICAL THERAPY ONCOLOGY TREATMENT  Patient Name: Jean Davidson MRN: [id number] DOB:August 28, 1945, 78 y.o., female Today's Date: 07/09/2023  END OF SESSION:  PT End of Session - 07/09/23 1200     Visit Number 3    Number of Visits 12    Date for PT Re-Evaluation 08/10/23    PT Start Time 1202    PT Stop Time 1248    PT Time Calculation (min) 46 min    Equipment Utilized During Treatment Gait belt    Activity Tolerance Patient tolerated treatment well    Behavior During Therapy WFL for tasks assessed/performed             Past Medical History:  Diagnosis Date   Anemia    Anxiety    Blood transfusion without reported diagnosis    Carotid stenosis    Mild bilateral   Carpal tunnel syndrome    Cataract    Cervical disc disease    c7   Cough 07/27/2015   Disc degeneration    Dysuria 07/26/2015   Failure of stem cell transplant (HCC)    Fatigue 01/26/2015   Fever 07/27/2015   GERD (gastroesophageal reflux disease)    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   Multiple myeloma (HCC)    Osteopenia    Pinched nerve    Sinusitis, bacterial 07/27/2015   Spinal stenosis in cervical region    Varicose veins of lower extremities with inflammation    Past Surgical History:  Procedure Laterality Date   ANKLE SURGERY Left    BLADDER SUSPENSION  1988/1989   x2   COLONOSCOPY     ESOPHAGOGASTRODUODENOSCOPY     IR IMAGING GUIDED PORT INSERTION  03/01/2018   LIMBAL STEM CELL TRANSPLANT     x2   PORT-A-CATH REMOVAL     SHOULDER SURGERY Right 04/06/2013   right   UPPER GASTROINTESTINAL ENDOSCOPY     Patient Active Problem List   Diagnosis Date Noted   Closed left ankle fracture 06/16/2022   Fluid overload 04/17/2022   Anemia due to antineoplastic  chemotherapy 06/24/2021   Sinusitis 06/13/2021   Neutropenic fever (HCC) 04/05/2021   Hyperglycemia, drug-induced 07/29/2020   Financial difficulties 05/27/2020   Bilateral cataracts 12/04/2019   Headache disorder 09/11/2019   Weight gain 09/11/2019   Skin lesion of back 08/07/2019   Pelvic floor dysfunction in female 07/09/2019   Alopecia 07/09/2019   Squamous cell cancer of skin of left temple 06/04/2019   Exertional shortness of breath 02/14/2019   Vitamin B12 deficiency 09/25/2018   Large hiatal hernia 07/26/2018   Belching 07/26/2018   Early satiety 07/26/2018   URI (upper respiratory infection) 06/25/2018   Lung nodule seen on imaging study 06/25/2018   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/11/2018   Tinnitus of both ears 04/10/2018   Preventive measure 04/10/2018   Goals of care, counseling/discussion 02/20/2018   Pain of left scapula 12/06/2017   Vitamin D  deficiency 11/12/2017   Yeast infection of the skin 09/06/2017   Family hx of colon cancer requiring screening colonoscopy 01/17/2017   Cystocele and rectocele with incomplete uterovaginal prolapse 11/22/2016   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   Bilateral pleural effusion 01/25/2016   Pericardial effusion 01/24/2016   Physical deconditioning 01/11/2016  S/P autologous bone marrow transplantation (HCC) 11/08/2015   Therapeutic drug monitoring 11/08/2015   Red blood cell antibody positive, compatible PRBC difficult to obtain 07/30/2015   Cough 07/27/2015   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   Fatigue 01/26/2015   Essential hypertension 01/26/2015   Other fatigue 01/26/2015   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/2015   Chronic recurrent sinusitis 10/23/2014   Conjunctivitis 10/23/2014   Status post allogeneic bone marrow transplant (HCC) 08/14/2014   Leukopenia due to antineoplastic chemotherapy (HCC) 08/10/2014   Patient in clinical research study  07/21/2014   Need for prophylactic vaccination and inoculation against influenza 02/24/2014   Diarrhea 02/03/2014   Leukocytosis 02/03/2014   Constipation 11/21/2013   Rash 11/21/2013   Back pain 11/21/2013   Anxiety 11/21/2013   Multiple myeloma in relapse (HCC) 11/07/2013   Pancytopenia, acquired (HCC) 10/29/2013   Thrombocytopenia (HCC) 10/29/2013   Persistent cough for 3 weeks or longer 10/02/2013   Abnormal weight loss 10/02/2013   Normocytic anemia 08/22/2013   Abdominal bloating 08/22/2013   Hypersensitivity 08/07/2013   Other malaise and fatigue 08/07/2013   Osteopenia 08/05/2013   GERD (gastroesophageal reflux disease) 01/16/2013   Insomnia disorder 01/16/2013   Carotid stenosis    Cervical disc disease    Hypertension 04/15/2012   Palpitations 03/29/2012    PCP: Deatrice Cage, MD  REFERRING PROVIDER: Almarie Bedford, MD  REFERRING DIAG: Deconditioning  THERAPY DIAG:  Multiple myeloma in relapse (HCC)  History of bone marrow transplant (HCC)  Chemotherapy-induced neuropathy (HCC)  Physical deconditioning  Gait instability  ONSET DATE: 1 year ago  Rationale for Evaluation and Treatment: Rehabilitation  SUBJECTIVE:                                                                                                                                                                                           SUBJECTIVE STATEMENT: I ordered a roller online, and I used a robe belt to do some stretches. I am still trying to get rid of my cold. Still a little strain in my ITB getting in and out of the car. I have my infusion tomorrow that takes 6 hours.  EVAL I am so achy in my legs the longer I move or stand.. It improves if I move around. I don't feel sure footed due to neuropathy from chemo. I wobble when I walk.I want to do more. I want to be able to sit on the floor for my 80 year old granddaughter. Balance is a concern. I can get up and down from chairs but I do it  slowly and I use my arms.  Right lateral thigh feels  very tight and has been since my Left ankle fx. I have fallen and broken my wrist and ring finger  and have a right reverse TSA.(2014). I don't meet my 5,000 step goal. I Feel tight in both calves. I have trouble getting out of my car because of  leg (ITB) pain on right, and difficulty putting on my socks.  PERTINENT HISTORY:  Pt has a hx of Multiple Myeloma initially diagnosed in 2015. Underwent chemotherapy with several different ones due to side effects. 07/23/2014 underwent a Bone Marrow Transplant at [facility] She has had multiple bone marrow biopsies over the years and changes of chemotherapy. She received an allogenic stem cell transplant in April 2017. She is currently on high dose IVIG treatment.  Had a CAR-T in  [facility] April. She is very deconditioned. It is just in the marrow, and not in the bone. Fx left ankle last year and had ORIF in Nov. 23.  PAIN:  Are you having pain? Yes NPRS scale: 0-2-3/10 achiness in ankles Pain location: legs, getting out of car, feet get cold and numb Pain orientation: Bilateral  PAIN TYPE: aching Pain description: intermittent  Aggravating factors: standing, walking Relieving factors: sitting down  PRECAUTIONS: Multiple Myeloma, macular degeneration, peripheral neuropathy, myelodisplastic syndrome,carotid stenosis(mild), Right reverse TSA, left ankle ORIF  WEIGHT BEARING RESTRICTIONS: No  FALLS:  Has patient fallen in last 6 months? No, but is at risk  LIVING ENVIRONMENT: Lives with: lives alone Lives in: House/apartment  OCCUPATION: retired engineer, site  LEISURE: arts and presenter, broadcasting,  HAND DOMINANCE: left   PRIOR LEVEL OF FUNCTION: Independent  PATIENT GOALS: Reduce achiness, improve confidence, improve balance,   OBJECTIVE: Note: Objective measures were completed at Evaluation unless otherwise noted.  COGNITION: Overall cognitive status: Within functional limits for tasks  assessed   PALPATION: Very tender full length of bilateral ITB  OBSERVATIONS / OTHER ASSESSMENTS:    SENSATION: Light touch: deficits bilateral feet  POSTURE: forward head, rounded shoulders  UPPER EXTREMITY AROM/PROM:  A/PROM RIGHT   eval   Shoulder extension   Shoulder flexion   Shoulder abduction   Shoulder internal rotation   Shoulder external rotation     (Blank rows = not tested)  A/PROM LEFT   eval  Shoulder extension   Shoulder flexion   Shoulder abduction   Shoulder internal rotation   Shoulder external rotation     (Blank rows = not tested)  CERVICAL AROM: NT      UPPER EXTREMITY STRENGTH:    LOWER EXTREMITY AROM/PROM:  MMT Right eval  Hip flexion 4+  Hip extension Can bridge  Hip abduction 4+/5 sit  Hip adduction 4+/5 sit  Hip internal rotation 4  Hip external rotation 4+  Knee flexion 5  Knee extension 4  Ankle dorsiflexion 5  Ankle plantarflexion 5/5  Ankle inversion 4+  Ankle eversion 4+   (Blank rows = not tested)  MMT LEFT eval  Hip flexion 4  Hip extension Can bridge  Hip abduction 4+ sit  Hip adduction 4+ sit  Hip internal rotation 4  Hip external rotation 4+  Knee flexion 5  Knee extension 4  Ankle dorsiflexion 4+  Ankle plantarflexion 4  Ankle inversion 4  Ankle eversion 3+   (Blank rows = not tested)  LOWER EXTREMITY FLEXIBILITY; HS Right 48, Left 53, Very tight ITB and piriformis B, Bilateral gastroc  FUNCTIONAL TESTS:  30 sec sit to stand:  8 using hands at knees (below avg for age)  4  position balance test able to maintain 10 sec, some sway with tandem stance, left greater than right SLS  Able to turn full circle in both directions without LOB, able to stand with eyes closed 20 seconds feet slightly apart   TUG; 16 seconds, short steps, toe out, decreased heel strike.  GAIT: Distance walked: 40 Assistive device utilized: None Level of assistance: SBA Comments: short ,choppy steps, decreased heel  strike, toe out   LOWER EXTREMITY FUNCTIONAL SCALE; 22   TODAY'S TREATMENT:                                                                                                                                         DATE:   07/09/2023 Nu Step seat 7, UE 8, Lev 5  x 5 minto warm up Incline stretch at parallel bars x 3 x 20 sec Marching no hands 2 x 10 CGA Heel raises with HH x 10, x 10 no HH Step up on ax x 10 forward B, x 10 sideways B with CGA Ambulated in cancer gym with CGA emphasis on heel strike, toe off and lengthening stride slightly Bilateral piriformis stretch x 3 x 20 sec Bilateral ITB rolling in SL  07/04/2022 Nu Step seat 7, UE 8, Lev 5 to warm up Piriformis stretch x 3 B supine x 20 sec HS stretch with strap x 3 B 30 sec Gastroc stretch with strap in sitting x 3 Bilateral ITB rolling x 4 min ea in sidelying Updated HEP with pics 06/15/2023 Discussed POC, visit number, treatment interventions. Will include aquatic therapy but not to start and only if she wants to. Set timer for 6O min and get up to move around  PATIENT EDUCATION:  Access Code: [email or web address] URL: [email or web address] Date: 07/05/2023 Prepared by: Grayce Sheldon  Exercises - Supine Hamstring Stretch with Strap  - 1 x daily - 7 x weekly - 1 sets - 3 reps - 20-30 hold - Supine Hip External Rotation Stretch  - 1 x daily - 7 x weekly - 3 sets - 3 reps - 20-30 hold - Seated Calf Stretch with Strap  - 1 x daily - 7 x weekly - 1 sets - 3 reps - 20-30 hold - Supine ITB Stretch with Strap  - 1 x daily - 7 x weekly - 1 sets - 3 reps - 20 hold Education details: POC, visit number, treatment interventions. Will include aquatic therapy but not to start and only if she wants to Person educated: Patient Education method: Explanation Education comprehension: verbalized understanding  HOME EXERCISE PROGRAM: Set alarm for 60 min to get up and walk around house  ASSESSMENT:  CLINICAL IMPRESSION: Pt did  quite well with balance exercises with most difficulty with forward step ups on ax. Pt ambulated around gym with much more normal gait pattern and increased heel strike with occasional VC's by therapist. Piriformis still very tight right  greater than left.  OBJECTIVE IMPAIRMENTS: Abnormal gait, decreased activity tolerance, decreased balance, decreased strength, impaired flexibility, impaired sensation, postural dysfunction, and pain.   ACTIVITY LIMITATIONS: carrying, bending, standing, squatting, stairs, and locomotion level  PARTICIPATION LIMITATIONS: cleaning, shopping, and community activity  PERSONAL FACTORS: Age and 1-2 comorbidities: Multiple Myeloma and current treatments  are also affecting patient's functional outcome.   REHAB POTENTIAL: Good  CLINICAL DECISION MAKING: Stable/uncomplicated  EVALUATION COMPLEXITY: Low  GOALS: Goals reviewed with patient? Yes  SHORT TERM GOALS: Target date: 07/12/2022  Pt will be independent with HEP to improve flexibility and strength Baseline: Goal status: INITIAL  2.  Pt will perform TUG in 13 seconds to decrease fall risk Baseline:  Goal status: INITIAL  3.  Pt will be able to perform  9 sit to stands in 30 seconds for average rating for her age Baseline: 8 Goal status: INITIAL  4.  Pt will have decreased right ITB pain by 25% Baseline:  Goal status: INITIAL  5.  Pt will ambulate with improved stride length and heel strike for more normal gait Baseline Goal status: INITIAL   LONG TERM GOALS: Target date: 08/09/2022  Pt will be able to don socks with improved ease Baseline:  Goal status: INITIAL  2.  Pt will have decreased right ITB pain by 50% or greater Baseline:  Goal status: INITIAL  3.  Pt will be able to get out of her car without right ITB pain Baseline:  Goal status: INITIAL  4.  LEFS will be increased to 40 to demonstrate improved function Baseline:  Goal status: INITIAL  5.  Pt will perform TUG in 11  seconds  or less to demonstrate decreased fall risk Baseline:  Goal status: INITIAL  6.  Pt will perform SLS for 14 seconds on each side to achieve age related norms Baseline:  Goal status: INITIAL  PLAN:  PT FREQUENCY: 2x/week  PT DURATION: 8 weeks  PLANNED INTERVENTIONS: 97164- PT Re-evaluation, 97110-Therapeutic exercises, 97530- Therapeutic activity, 97112- Neuromuscular re-education, 97535- Self Care, [id number]- Manual therapy, [phone]- Gait training, and [phone]- Aquatic Therapy  PLAN FOR NEXT SESSION:  ITB stretches;already gave pics, rolling to bilateral ITB, strength, balance; HEP; heel raises, sit to stand, stretches etc   Grayce JINNY Sheldon, PT 07/09/2023, 12:54 PM

## 2023-07-10 ENCOUNTER — Encounter: Payer: Self-pay | Admitting: Hematology and Oncology

## 2023-07-10 ENCOUNTER — Inpatient Hospital Stay: Payer: Medicare HMO | Admitting: Hematology and Oncology

## 2023-07-10 ENCOUNTER — Inpatient Hospital Stay: Payer: Medicare HMO

## 2023-07-10 ENCOUNTER — Inpatient Hospital Stay: Payer: Medicare HMO | Attending: Hematology and Oncology

## 2023-07-10 VITALS — BP 125/70 | HR 70 | Temp 97.7°F | Resp 17

## 2023-07-10 VITALS — BP 126/80 | HR 88 | Temp 97.9°F | Resp 18 | Ht 62.0 in | Wt 160.0 lb

## 2023-07-10 DIAGNOSIS — C9002 Multiple myeloma in relapse: Secondary | ICD-10-CM | POA: Insufficient documentation

## 2023-07-10 DIAGNOSIS — D801 Nonfamilial hypogammaglobulinemia: Secondary | ICD-10-CM | POA: Insufficient documentation

## 2023-07-10 DIAGNOSIS — C946 Myelodysplastic disease, not classified: Secondary | ICD-10-CM

## 2023-07-10 DIAGNOSIS — D6481 Anemia due to antineoplastic chemotherapy: Secondary | ICD-10-CM

## 2023-07-10 LAB — CBC WITH DIFFERENTIAL/PLATELET
Abs Immature Granulocytes: 0.01 10*3/uL (ref 0.00–0.07)
Basophils Absolute: 0 10*3/uL (ref 0.0–0.1)
Basophils Relative: 0 %
Eosinophils Absolute: 0 10*3/uL (ref 0.0–0.5)
Eosinophils Relative: 0 %
HCT: 34.6 % — ABNORMAL LOW (ref 36.0–46.0)
Hemoglobin: 11.4 g/dL — ABNORMAL LOW (ref 12.0–15.0)
Immature Granulocytes: 0 %
Lymphocytes Relative: 35 %
Lymphs Abs: 1.3 10*3/uL (ref 0.7–4.0)
MCH: 33.7 pg (ref 26.0–34.0)
MCHC: 32.9 g/dL (ref 30.0–36.0)
MCV: 102.4 fL — ABNORMAL HIGH (ref 80.0–100.0)
Monocytes Absolute: 0.4 10*3/uL (ref 0.1–1.0)
Monocytes Relative: 10 %
Neutro Abs: 2.1 10*3/uL (ref 1.7–7.7)
Neutrophils Relative %: 55 %
Platelets: 141 10*3/uL — ABNORMAL LOW (ref 150–400)
RBC: 3.38 MIL/uL — ABNORMAL LOW (ref 3.87–5.11)
RDW: 13.5 % (ref 11.5–15.5)
WBC: 3.8 10*3/uL — ABNORMAL LOW (ref 4.0–10.5)
nRBC: 0 % (ref 0.0–0.2)

## 2023-07-10 LAB — COMPREHENSIVE METABOLIC PANEL
ALT: 15 U/L (ref 0–44)
AST: 21 U/L (ref 15–41)
Albumin: 4.1 g/dL (ref 3.5–5.0)
Alkaline Phosphatase: 76 U/L (ref 38–126)
Anion gap: 8 (ref 5–15)
BUN: 18 mg/dL (ref 8–23)
CO2: 29 mmol/L (ref 22–32)
Calcium: 9.5 mg/dL (ref 8.9–10.3)
Chloride: 103 mmol/L (ref 98–111)
Creatinine, Ser: 0.85 mg/dL (ref 0.44–1.00)
GFR, Estimated: >60 mL/min (ref >60)
Glucose, Bld: 114 mg/dL — ABNORMAL HIGH (ref 70–99)
Potassium: 3.9 mmol/L (ref 3.5–5.1)
Sodium: 140 mmol/L (ref 135–145)
Total Bilirubin: 0.4 mg/dL (ref 0.0–1.2)
Total Protein: 7.2 g/dL (ref 6.5–8.1)

## 2023-07-10 MED ORDER — FAMOTIDINE IN NACL 20-0.9 MG/50ML-% IV SOLN
20.0000 mg | Freq: Once | INTRAVENOUS | Status: AC
  Administered 2023-07-10: 20 mg via INTRAVENOUS

## 2023-07-10 MED ORDER — IMMUNE GLOBULIN (HUMAN) 10 GM/100ML IV SOLN
70.0000 g | Freq: Once | INTRAVENOUS | Status: AC
  Administered 2023-07-10: 70 g via INTRAVENOUS
  Filled 2023-07-10: qty 700

## 2023-07-10 MED ORDER — DEXTROSE 5 % IV SOLN: Freq: Once | INTRAVENOUS | Status: AC

## 2023-07-10 MED ORDER — ACETAMINOPHEN 325 MG PO TABS
650.0000 mg | ORAL_TABLET | Freq: Once | ORAL | Status: AC
Start: 2023-07-10 — End: 2023-07-10
  Administered 2023-07-10: 650 mg via ORAL

## 2023-07-10 MED ORDER — METHYLPREDNISOLONE SODIUM SUCC 40 MG IJ SOLR
40.0000 mg | Freq: Once | INTRAMUSCULAR | Status: AC
  Administered 2023-07-10: 40 mg via INTRAVENOUS

## 2023-07-10 NOTE — Assessment & Plan Note (Signed)
 She has acquired hypogammaglobulinemia  She will continue IVIG monthly She is overdue for pneumococcal vaccination and we will administer today

## 2023-07-10 NOTE — Progress Notes (Signed)
 Per Dr Bertis Ruddy, pt to receive pneumococcal 20 vaccine today. Pt requests to verify with [facility] prior to receiving vaccine, per Dr Bertis Ruddy- ok to give at next visit.

## 2023-07-10 NOTE — Progress Notes (Signed)
 [facility] OFFICE PROGRESS NOTE  Patient Care Team: Default, Provider, MD as PCP - General Darron Deatrice LABOR, MD as PCP - Cardiology (Cardiology) Darren Liborio Backers, MD as Referring Physician (Hematology and Oncology) Guinevere File, MD as Consulting Physician (Internal Medicine) Fleeta Rothman, Jomarie SAILOR, MD as Consulting Physician (Infectious Diseases) Marda Maranda Foots An, MD as Consulting Physician (Hematology and Oncology) Honora Lenon Irving, NP as Nurse Practitioner (Hematology and Oncology) Lonn Hicks, MD as Consulting Physician (Hematology and Oncology)  ASSESSMENT & PLAN:  Multiple myeloma in relapse [facility]) Recent myeloma panel show remission although immunofixation revealed persistent IgA lambda Continue to monitor once a month  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   All questions were answered. The patient knows to call the clinic with any problems, questions or concerns. The total time spent in the appointment was 20 minutes encounter with patients including review of chart and various tests results, discussions about plan of care and coordination of care plan   Hicks Lonn, MD 07/10/2023 10:22 AM  INTERVAL HISTORY: Please see below for problem oriented charting. she returns for surveillance follow-up and IVIG treatment We discussed recent myeloma panel She is doing well with physical therapy No recent falls Her neuropathy is noticeable We discussed vaccination  REVIEW OF SYSTEMS:   Constitutional: Denies fevers, chills or abnormal weight loss Eyes: Denies blurriness of vision Ears, nose, mouth, throat, and face: Denies mucositis or sore throat Respiratory: Denies cough, dyspnea or wheezes Cardiovascular: Denies palpitation, chest discomfort  or lower extremity swelling Gastrointestinal:  Denies nausea, heartburn or change in bowel habits Skin: Denies abnormal skin rashes Lymphatics: Denies new lymphadenopathy or easy bruising Behavioral/Psych: Mood is stable, no new changes  All other systems were reviewed with the patient and are negative.  I have reviewed the past medical history, past surgical history, social history and family history with the patient and they are unchanged from previous note.  ALLERGIES:  has no known allergies.  MEDICATIONS:  Current Outpatient Medications  Medication Sig Dispense Refill   aspirin  EC 81 MG tablet Take 81 mg by mouth daily. Swallow whole.     acyclovir  (ZOVIRAX ) 400 MG tablet Take 1 tablet (400 mg total) by mouth 2 (two) times daily. 180 tablet 1   atovaquone  (MEPRON ) 750 MG/5ML suspension Take 1,500 mg by mouth 2 (two) times daily.     cholecalciferol  (VITAMIN D3) 25 MCG (1000 UNIT) tablet Take 4,000 Units by mouth daily. Instructed to increase to 4,000 units daily     metoprolol  tartrate (LOPRESSOR ) 25 MG tablet TAKE 1 & 1/2 TABLET BY MOUTH TWICE A DAY--due for follow up appointment.  PLEASE CALL OFFICE TO SCHEDULE APPOINTMENT PRIOR TO NEXT REFILL 270 tablet 0   mirtazapine  (REMERON ) 15 MG tablet Take 1 tablet (15 mg total) by mouth at bedtime. 90 tablet 1   Multiple Vitamins-Minerals (PRESERVISION AREDS 2 PO) Take 1 tablet by mouth 2 (two) times daily.     ondansetron  (ZOFRAN ) 8 MG tablet Take 1 tablet (8 mg total) by mouth every 8 (eight) hours as needed (Nausea or vomiting). (Patient taking differently: Take 8 mg by mouth 2 (two) times daily.) 60 tablet 1   pantoprazole  (PROTONIX ) 40 MG tablet Take 1 tablet (40 mg total) by mouth daily.     prochlorperazine  (COMPAZINE ) 5 MG tablet Take 5  mg by mouth every 6 (six) hours as needed for nausea or vomiting.     senna-docusate (SENOKOT-S) 8.6-50 MG tablet Take 2 tablets by mouth daily as needed.     No current facility-administered  medications for this visit.    SUMMARY OF ONCOLOGIC HISTORY: Oncology History Overview Note  Multiple myeloma, Ig A Lambda, M spike 3.54 grams, Calcium  9.2, Creatinine 0.8, Beta 2 microglobulin 4.52, IgA 4840 mg/dL, lambda light chain 24.5, albumin 3.6, hemoglobin 9.7, platelet 115    Primary site: Multiple Myeloma   Staging method: AJCC 6th Edition   Clinical: Stage IIA signed by Almarie Bedford, MD on 11/07/2013  2:46 PM   Summary: Stage IIA S/p Allo transplant at [facility] on revlimid , pomalyst , daratumumab  and velcade      Multiple myeloma in relapse (HCC)  10/31/2013 Bone Marrow Biopsy   Bone marrow biopsy confirmed multiple myeloma with 40% bone marrow involvement. Skeletal survey showed minimal lesions in her score with generalized demineralization   11/10/2013 - 02/13/2014 Chemotherapy   The patient is started on induction chemotherapy with weekly dexamethasone  40 mg by mouth as well as Velcade  subcutaneous injection on days 1, 4, 8 and 11. On 11/21/2013, she was started on monthly Zometa .   12/23/2013 Adverse Reaction   The dose of Velcade  was reduced due to thrombocytopenia.   01/28/2014 - 04/07/2014 Chemotherapy   Revlimid  is added. Treatment was discontinued due to lack of response.   02/24/2014 - 04/07/2014 Chemotherapy   Due to worsening peripheral neuropathy, Velcade  injection is changed to once a week. Revlimid  was given 21 days on, 7 days off.   04/07/2014 - 04/10/2014 Chemotherapy   Revlimid  was discontinued due to lack of response. Chemotherapy was changed back to Velcade  injection twice a week, 2 weeks on 1 week off. Her treatment was switched to to minimum response   04/20/2014 - 06/02/2014 Chemotherapy   chemotherapy is switched to Carfilzomib , Cytoxan  and dexamethasone .   04/22/2014 Procedure   she has placement of port for chemotherapy.   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 at [facility]   10/20/2014 - 03/24/2015 Chemotherapy    she received chemotherapy with Kyprolis , Revlimid  and dexamethasone    10/22/2014 Procedure   She has port placement   01/19/2015 Tumor Marker   IgA lambda M spike at 0.4 g    01/20/2015 Miscellaneous   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 No cource was found and fever resolved with IV vancomycin  and meropenem    09/13/2015 Bone Marrow Biopsy   Bone marrow biopsy showed no increased blasts, 3-4 % plasma cells   03/02/2016 Bone Marrow Biopsy   Bone marrow biopsy  at [facility] showed normocellular (30%) bone marrow with trilineage hematopoiesis. No significant increase in blasts. No significant increase in plasma cells.   05/12/2016 Imaging   DEXA scan at [facility] showed osteopenia   10/24/2016 Imaging   Skeletal survey at [facility], no new lesions   12/07/2017 Imaging   No focal abnormality noted to suggest myeloma. Exam is stable from prior exam.   03/01/2018 Procedure   Successful 8 French right internal jugular vein power port placement with its tip at the SVC/RA junction.   03/06/2018 -  Chemotherapy   The patient had daratumumab     07/09/2018 Bone Marrow Biopsy   Bone marrow biopsy at [facility] showed  residual disease at 0.004% plasma cells   07/03/2019 Bone Marrow Biopsy   A. Bone marrow, flow cytometric analysis for multiple myeloma minimal residual disease detection:   Negative. No phenotypically abnormal plasma cells at or above the limit of detection identified.     No monotypic B-cell population identified. Negative for increased blasts.   03/25/2021 Bone Marrow Biopsy   Repeat bone marrow biopsy at [facility] showed female donor karyotype.  5 to 7% plasma cell is seen.  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   04/20/2021 Echocardiogram   1. Left ventricular ejection fraction, by estimation, is 60 to 65%. The left ventricle has normal function. The left ventricle has no regional wall motion abnormalities. Left ventricular diastolic parameters were normal.  2. Right ventricular systolic function is normal. The right ventricular size is normal.  3. The mitral valve is normal in structure. No evidence of mitral valve regurgitation. No evidence of mitral stenosis.  4. The aortic valve is normal in structure. Aortic valve regurgitation is not visualized. No aortic stenosis is present.  5. The inferior vena cava is normal in size with greater than 50% respiratory variability, suggesting right atrial pressure of 3 mmHg.   04/29/2021 - 02/17/2022 Chemotherapy   Patient is on Treatment Plan : MYELOMA RELAPSED/ REFRACTORY Carfilzomib  D1,8,15 (20/27) + Dexamethasone  (KPd) q28d     04/30/2021 - 09/28/2022 Chemotherapy   Patient is on Treatment Plan : MYELOMA RELAPSED/REFRACTORY Carfilzomib  (20/70) D1,8,15 + Dexamethasone  weekly (40) (Kd) q28d  x 9 cycles / Dexamethasone  D1,8,15     05/04/2021 PET scan   1. No evidence active multiple myeloma within the skeleton on FDG PET scan. 2. No suspicious lytic lesion on CT portion exam. 3. No  plasmacytoma.   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HCC)  04/06/2015 Bone Marrow Biopsy   Accession: [id number] BM biopsy showed RAEB-1   04/06/2015 Tumor Marker   Cytogenetics and FISH for MDS are within normal limits   10/06/2015 - 10/10/2015 Chemotherapy   She received conditioning chemotherapy with busulfan and melphalan   10/12/2015 Bone Marrow Transplant   She received allogenic stem cell transplant   10/19/2015 Adverse Reaction   She developed posttransplant complication with mucositis, viral infection with rhinovirus, neutropenic fever, bilateral pleural effusion and moderate pericardial effusion and CMV reactivation.   10/31/2015 Miscellaneous   She has engrafted     PHYSICAL EXAMINATION: ECOG PERFORMANCE STATUS: 1 - Symptomatic but completely ambulatory  Vitals:   07/10/23 0819  BP: 126/80  Pulse: 88  Resp: 18  Temp: 97.9 F (36.6 C)  SpO2: 97%   Filed Weights   07/10/23 0819  Weight: 160 lb (72.6 kg)    GENERAL:alert,  no distress and comfortable  LABORATORY DATA:  I have reviewed the data as listed    Component Value Date/Time   NA 140 07/10/2023 0732   NA 142 01/04/2017 0902   K 3.9 07/10/2023 0732   K 3.9 01/04/2017 0902   CL 103 07/10/2023 0732   CO2 29 07/10/2023 0732   CO2 28 01/04/2017 0902   GLUCOSE 114 (H) 07/10/2023 0732   GLUCOSE 92 01/04/2017 0902   BUN 18 07/10/2023 0732   BUN 9.5 01/04/2017 0902   CREATININE 0.85 07/10/2023 0732   CREATININE 0.76 09/26/2022 1256   CREATININE 0.7 01/04/2017 0902   CALCIUM  9.5 07/10/2023 0732   CALCIUM  9.0 01/04/2017 0902   PROT 7.2 07/10/2023 0732   PROT 6.0 (L) 01/04/2017 0902   PROT 5.8 (L) 01/04/2017 0902   ALBUMIN 4.1 07/10/2023 0732   ALBUMIN 3.4 (L) 01/04/2017 0902   AST 21 07/10/2023 0732   AST 9 (L) 09/26/2022 1256   AST 21 01/04/2017 0902   ALT 15 07/10/2023 0732   ALT 6 09/26/2022 1256   ALT 16 01/04/2017 0902   ALKPHOS 76 07/10/2023 0732   ALKPHOS 101 01/04/2017  0902   BILITOT 0.4 07/10/2023 0732   BILITOT 0.3 09/26/2022 1256   BILITOT 0.56 01/04/2017 0902   GFRNONAA >60 07/10/2023 0732   GFRNONAA >60 09/26/2022 1256   GFRAA >60 03/25/2020 0917   GFRAA >60 12/18/2018 0901    No results found for: SPEP, UPEP  Lab Results  Component Value Date   WBC 3.8 (L) 07/10/2023   NEUTROABS 2.1 07/10/2023   HGB 11.4 (L) 07/10/2023   HCT 34.6 (L) 07/10/2023   MCV 102.4 (H) 07/10/2023   PLT 141 (L) 07/10/2023      Chemistry      Component Value Date/Time   NA 140 07/10/2023 0732   NA 142 01/04/2017 0902   K 3.9 07/10/2023 0732   K 3.9 01/04/2017 0902   CL 103 07/10/2023 0732   CO2 29 07/10/2023 0732   CO2 28 01/04/2017 0902   BUN 18 07/10/2023 0732   BUN 9.5 01/04/2017 0902   CREATININE 0.85 07/10/2023 0732   CREATININE 0.76 09/26/2022 1256   CREATININE 0.7 01/04/2017 0902      Component Value Date/Time   CALCIUM  9.5 07/10/2023 0732   CALCIUM  9.0 01/04/2017 0902   ALKPHOS 76 07/10/2023 0732   ALKPHOS 101 01/04/2017 0902   AST 21 07/10/2023 0732   AST 9 (L) 09/26/2022 1256   AST 21 01/04/2017 0902   ALT 15 07/10/2023 0732   ALT 6 09/26/2022 1256   ALT 16 01/04/2017 0902   BILITOT 0.4 07/10/2023 0732   BILITOT 0.3 09/26/2022 1256   BILITOT 0.56 01/04/2017 0902

## 2023-07-10 NOTE — Assessment & Plan Note (Signed)
 Recent myeloma panel show remission although immunofixation revealed persistent IgA lambda Continue to monitor once a month

## 2023-07-10 NOTE — Patient Instructions (Signed)

## 2023-07-11 LAB — KAPPA/LAMBDA LIGHT CHAINS
Kappa free light chain: 12.1 mg/L (ref 3.3–19.4)
Kappa, lambda light chain ratio: 0.29 (ref 0.26–1.65)
Lambda free light chains: 41.2 mg/L — ABNORMAL HIGH (ref 5.7–26.3)

## 2023-07-12 ENCOUNTER — Ambulatory Visit: Payer: Medicare HMO

## 2023-07-16 ENCOUNTER — Ambulatory Visit: Payer: Medicare HMO

## 2023-07-16 DIAGNOSIS — R5381 Other malaise: Secondary | ICD-10-CM

## 2023-07-16 DIAGNOSIS — C9002 Multiple myeloma in relapse: Secondary | ICD-10-CM

## 2023-07-16 DIAGNOSIS — R2681 Unsteadiness on feet: Secondary | ICD-10-CM

## 2023-07-16 DIAGNOSIS — G620 Drug-induced polyneuropathy: Secondary | ICD-10-CM

## 2023-07-16 DIAGNOSIS — Z9481 Bone marrow transplant status: Secondary | ICD-10-CM

## 2023-07-16 NOTE — Therapy (Signed)
OUTPATIENT PHYSICAL THERAPY ONCOLOGY TREATMENT  Patient Name: Jean Davidson MRN: [id number] DOB:Jun 05, 1946, 78 y.o., female Today's Date: 07/16/2023  END OF SESSION:  PT End of Session - 07/16/23 1101     Visit Number 4    Number of Visits 12    Date for PT Re-Evaluation 08/10/23    PT Start Time 1102    PT Stop Time 1155    PT Time Calculation (min) 53 min    Equipment Utilized During Treatment Gait belt    Activity Tolerance Patient tolerated treatment well    Behavior During Therapy WFL for tasks assessed/performed             Past Medical History:  Diagnosis Date   Anemia    Anxiety    Blood transfusion without reported diagnosis    Carotid stenosis    Mild bilateral   Carpal tunnel syndrome    Cataract    Cervical disc disease    c7   Cough 07/27/2015   Disc degeneration    Dysuria 07/26/2015   Failure of stem cell transplant (HCC)    Fatigue 01/26/2015   Fever 07/27/2015   GERD (gastroesophageal reflux disease)    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   Multiple myeloma (HCC)    Osteopenia    Pinched nerve    Sinusitis, bacterial 07/27/2015   Spinal stenosis in cervical region    Varicose veins of lower extremities with inflammation    Past Surgical History:  Procedure Laterality Date   ANKLE SURGERY Left    BLADDER SUSPENSION  1988/1989   x2   COLONOSCOPY     ESOPHAGOGASTRODUODENOSCOPY     IR IMAGING GUIDED PORT INSERTION  03/01/2018   LIMBAL STEM CELL TRANSPLANT     x2   PORT-A-CATH REMOVAL     SHOULDER SURGERY Right 04/06/2013   right   UPPER GASTROINTESTINAL ENDOSCOPY     Patient Active Problem List   Diagnosis Date Noted   Closed left ankle fracture 06/16/2022   Fluid overload 04/17/2022   Anemia due to antineoplastic  chemotherapy 06/24/2021   Sinusitis 06/13/2021   Neutropenic fever (HCC) 04/05/2021   Hyperglycemia, drug-induced 07/29/2020   Financial difficulties 05/27/2020   Bilateral cataracts 12/04/2019   Headache disorder 09/11/2019   Weight gain 09/11/2019   Skin lesion of back 08/07/2019   Pelvic floor dysfunction in female 07/09/2019   Alopecia 07/09/2019   Squamous cell cancer of skin of left temple 06/04/2019   Exertional shortness of breath 02/14/2019   Vitamin B12 deficiency 09/25/2018   Large hiatal hernia 07/26/2018   Belching 07/26/2018   Early satiety 07/26/2018   URI (upper respiratory infection) 06/25/2018   Lung nodule seen on imaging study 06/25/2018   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/11/2018   Tinnitus of both ears 04/10/2018   Preventive measure 04/10/2018   Goals of care, counseling/discussion 02/20/2018   Pain of left scapula 12/06/2017   Vitamin D deficiency 11/12/2017   Yeast infection of the skin 09/06/2017   Family hx of colon cancer requiring screening colonoscopy 01/17/2017   Cystocele and rectocele with incomplete uterovaginal prolapse 11/22/2016   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   Bilateral pleural effusion 01/25/2016   Pericardial effusion 01/24/2016   Physical deconditioning 01/11/2016  S/P autologous bone marrow transplantation (HCC) 11/08/2015   Therapeutic drug monitoring 11/08/2015   Red blood cell antibody positive, compatible PRBC difficult to obtain 07/30/2015   Cough 07/27/2015   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   Fatigue 01/26/2015   Essential hypertension 01/26/2015   Other fatigue 01/26/2015   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/2015   Chronic recurrent sinusitis 10/23/2014   Conjunctivitis 10/23/2014   Status post allogeneic bone marrow transplant (HCC) 08/14/2014   Leukopenia due to antineoplastic chemotherapy (HCC) 08/10/2014   Patient in clinical research study  07/21/2014   Need for prophylactic vaccination and inoculation against influenza 02/24/2014   Diarrhea 02/03/2014   Leukocytosis 02/03/2014   Constipation 11/21/2013   Rash 11/21/2013   Back pain 11/21/2013   Anxiety 11/21/2013   Multiple myeloma in relapse (HCC) 11/07/2013   Pancytopenia, acquired (HCC) 10/29/2013   Thrombocytopenia (HCC) 10/29/2013   Persistent cough for 3 weeks or longer 10/02/2013   Abnormal weight loss 10/02/2013   Normocytic anemia 08/22/2013   Abdominal bloating 08/22/2013   Hypersensitivity 08/07/2013   Other malaise and fatigue 08/07/2013   Osteopenia 08/05/2013   GERD (gastroesophageal reflux disease) 01/16/2013   Insomnia disorder 01/16/2013   Carotid stenosis    Cervical disc disease    Hypertension 04/15/2012   Palpitations 03/29/2012    PCP: Lorine Bears, MD  REFERRING PROVIDER: Artis Delay, MD  REFERRING DIAG: Deconditioning  THERAPY DIAG:  Multiple myeloma in relapse (HCC)  History of bone marrow transplant (HCC)  Chemotherapy-induced neuropathy (HCC)  Physical deconditioning  Gait instability  ONSET DATE: 1 year ago  Rationale for Evaluation and Treatment: Rehabilitation  SUBJECTIVE:                                                                                                                                                                                           SUBJECTIVE STATEMENT: I was exhausted the rest of the week after my last infusion. One of my numbers doubled in a month. Did well after last visit. I got my roller and I have used it. I will have to go back on the chemo drug.  EVAL I am so achy in my legs the longer I move or stand.. It improves if I move around. I don't feel sure footed due to neuropathy from chemo. I wobble when I walk.I want to do more. I want to be able to sit on the floor for my 32 year old granddaughter. Balance is a concern. I can get up and down from chairs but I do it slowly and I use my arms.   Right lateral thigh feels very tight and has  been since my Left ankle fx. I have fallen and broken my wrist and ring finger  and have a right reverse TSA.(2014). I don't meet my 5,000 step goal. I Feel tight in both calves. I have trouble getting out of my car because of  leg (ITB) pain on right, and difficulty putting on my socks.  PERTINENT HISTORY:  Pt has a hx of Multiple Myeloma initially diagnosed in 2015. Underwent chemotherapy with several different ones due to side effects. 07/23/2014 underwent a Bone Marrow Transplant at [facility] She has had multiple bone marrow biopsies over the years and changes of chemotherapy. She received an allogenic stem cell transplant in April 2017. She is currently on high dose IVIG treatment.  Had a CAR-T in  [facility] April. She is very deconditioned. It is just in the marrow, and not in the bone. Fx left ankle last year and had ORIF in Nov. 23.  PAIN:  Are you having pain? NO, I am still just exhausted and frustrated  PRECAUTIONS: Multiple Myeloma, macular degeneration, peripheral neuropathy, myelodisplastic syndrome,carotid stenosis(mild), Right reverse TSA, left ankle ORIF  WEIGHT BEARING RESTRICTIONS: No  FALLS:  Has patient fallen in last 6 months? No, but is at risk  LIVING ENVIRONMENT: Lives with: lives alone Lives in: House/apartment  OCCUPATION: retired Engineer, site  LEISURE: arts and Presenter, broadcasting,  HAND DOMINANCE: left   PRIOR LEVEL OF FUNCTION: Independent  PATIENT GOALS: Reduce achiness, improve confidence, improve balance,   OBJECTIVE: Note: Objective measures were completed at Evaluation unless otherwise noted.  COGNITION: Overall cognitive status: Within functional limits for tasks assessed   PALPATION: Very tender full length of bilateral ITB  OBSERVATIONS / OTHER ASSESSMENTS:    SENSATION: Light touch: deficits bilateral feet  POSTURE: forward head, rounded shoulders  UPPER EXTREMITY AROM/PROM:  A/PROM RIGHT   eval    Shoulder extension   Shoulder flexion   Shoulder abduction   Shoulder internal rotation   Shoulder external rotation     (Blank rows = not tested)  A/PROM LEFT   eval  Shoulder extension   Shoulder flexion   Shoulder abduction   Shoulder internal rotation   Shoulder external rotation     (Blank rows = not tested)  CERVICAL AROM: NT      UPPER EXTREMITY STRENGTH:    LOWER EXTREMITY AROM/PROM:  MMT Right eval  Hip flexion 4+  Hip extension Can bridge  Hip abduction 4+/5 sit  Hip adduction 4+/5 sit  Hip internal rotation 4  Hip external rotation 4+  Knee flexion 5  Knee extension 4  Ankle dorsiflexion 5  Ankle plantarflexion 5/5  Ankle inversion 4+  Ankle eversion 4+   (Blank rows = not tested)  MMT LEFT eval  Hip flexion 4  Hip extension Can bridge  Hip abduction 4+ sit  Hip adduction 4+ sit  Hip internal rotation 4  Hip external rotation 4+  Knee flexion 5  Knee extension 4  Ankle dorsiflexion 4+  Ankle plantarflexion 4  Ankle inversion 4  Ankle eversion 3+   (Blank rows = not tested)  LOWER EXTREMITY FLEXIBILITY; HS Right 48, Left 53, Very tight ITB and piriformis B, Bilateral gastroc  FUNCTIONAL TESTS:  30 sec sit to stand:  8 using hands at knees (below avg for age)  4 position balance test able to maintain 10 sec, some sway with tandem stance, left greater than right SLS  Able to turn full circle in both directions without LOB, able to stand with eyes  closed 20 seconds feet slightly apart   TUG; 16 seconds, short steps, toe out, decreased heel strike.  GAIT: Distance walked: 40 Assistive device utilized: None Level of assistance: SBA Comments: short ,choppy steps, decreased heel strike, toe out   LOWER EXTREMITY FUNCTIONAL SCALE; 22   TODAY'S TREATMENT:                                                                                                                                         DATE:   07/16/2023 Nu Step seat 6, UE 8,  Lev 5  x 5 min to warm up Incline stretch at parallel bars x 3 x 20 sec Marching no hands 2 x 10 CGA on ax Heel raises with HH x 10, x 10 no HH on ax Step up on ax x 10 forward B, x 10 sideways B with CGA Ambulated to ortho gym back wall and back, CGA with emphasis on heel strike/toe off Ax beam x 4 lengths ea; forward and sideways with CGA of PT Bridging x10, ppt x 10 Ppt with march x 10 Ppt with ball squeeze between knees x 7 Bilateral ITB and hamstring stretches x 3 done by PT with 20 sec hold Updated HEP 07/09/2023 Nu Step seat 7, UE 8, Lev 5  x 5 minto warm up Incline stretch at parallel bars x 3 x 20 sec Marching no hands 2 x 10 CGA Heel raises with HH x 10, x 10 no HH Step up on ax x 10 forward B, x 10 sideways B with CGA Ambulated in cancer gym with CGA emphasis on heel strike, toe off and lengthening stride slightly Bilateral piriformis stretch x 3 x 20 sec Bilateral ITB rolling in SL  07/04/2022 Nu Step seat 7, UE 8, Lev 5 to warm up Piriformis stretch x 3 B supine x 20 sec HS stretch with strap x 3 B 30 sec Gastroc stretch with strap in sitting x 3 Bilateral ITB rolling x 4 min ea in sidelying Updated HEP with pics 06/15/2023 Discussed POC, visit number, treatment interventions. Will include aquatic therapy but not to start and only if she wants to. Set timer for 6O min and get up to move around  PATIENT EDUCATION:  Access Code: [email or web address] URL: [email or web address] Date: 07/16/2023 Prepared by: Alvira Monday  Exercises - Supine Bridge  - 1 x daily - 7 x weekly - 1 sets - 10 reps - Supine Pelvic Tilt  - 1 x daily - 7 x weekly - 1 sets - 10 reps - Supine Hip Adduction Isometric with Ball  - 1 x daily - 7 x weekly - 1 sets - 10 reps Access Code: [email or web address] URL: [email or web address] Date: 07/05/2023 Prepared by: Alvira Monday  Exercises - Supine Hamstring Stretch with Strap  - 1 x daily - 7 x weekly - 1 sets - 3 reps - 20-30 hold - Supine  Hip  External Rotation Stretch  - 1 x daily - 7 x weekly - 3 sets - 3 reps - 20-30 hold - Seated Calf Stretch with Strap  - 1 x daily - 7 x weekly - 1 sets - 3 reps - 20-30 hold - Supine ITB Stretch with Strap  - 1 x daily - 7 x weekly - 1 sets - 3 reps - 20 hold Education details: POC, visit number, treatment interventions. Will include aquatic therapy but not to start and only if she wants to Person educated: Patient Education method: Explanation Education comprehension: verbalized understanding  HOME EXERCISE PROGRAM: Set alarm for 60 min to get up and walk around house  ASSESSMENT:  CLINICAL IMPRESSION: Pt is still experiencing fatigue since her last infusion and is disappointed that her numbers are up again and she will have to start chemo again. She is improving her stride length and heel strike as she walks and feels more comfortable lengthening her stride a little. She did well with ax beam and was able to perform some without handhold  OBJECTIVE IMPAIRMENTS: Abnormal gait, decreased activity tolerance, decreased balance, decreased strength, impaired flexibility, impaired sensation, postural dysfunction, and pain.   ACTIVITY LIMITATIONS: carrying, bending, standing, squatting, stairs, and locomotion level  PARTICIPATION LIMITATIONS: cleaning, shopping, and community activity  PERSONAL FACTORS: Age and 1-2 comorbidities: Multiple Myeloma and current treatments  are also affecting patient's functional outcome.   REHAB POTENTIAL: Good  CLINICAL DECISION MAKING: Stable/uncomplicated  EVALUATION COMPLEXITY: Low  GOALS: Goals reviewed with patient? Yes  SHORT TERM GOALS: Target date: 07/12/2022  Pt will be independent with HEP to improve flexibility and strength Baseline: Goal status: INITIAL  2.  Pt will perform TUG in 13 seconds to decrease fall risk Baseline:  Goal status: INITIAL  3.  Pt will be able to perform  9 sit to stands in 30 seconds for average rating for her  age Baseline: 8 Goal status: INITIAL  4.  Pt will have decreased right ITB pain by 25% Baseline:  Goal status: INITIAL  5.  Pt will ambulate with improved stride length and heel strike for more normal gait Baseline Goal status: INITIAL   LONG TERM GOALS: Target date: 08/09/2022  Pt will be able to don socks with improved ease Baseline:  Goal status: INITIAL  2.  Pt will have decreased right ITB pain by 50% or greater Baseline:  Goal status: INITIAL  3.  Pt will be able to get out of her car without right ITB pain Baseline:  Goal status: MET 07/15/2022 4.  LEFS will be increased to 40 to demonstrate improved function Baseline:  Goal status: INITIAL  5.  Pt will perform TUG in 11 seconds  or less to demonstrate decreased fall risk Baseline:  Goal status: INITIAL  6.  Pt will perform SLS for 14 seconds on each side to achieve age related norms Baseline:  Goal status: INITIAL  PLAN:  PT FREQUENCY: 2x/week  PT DURATION: 8 weeks  PLANNED INTERVENTIONS: [id number]- PT Re-evaluation, 97110-Therapeutic exercises, 97530- Therapeutic activity, 97112- Neuromuscular re-education, 97535- Self Care, [id number]- Manual therapy, [phone]- Gait training, and [phone]- Aquatic Therapy  PLAN FOR NEXT SESSION:  ITB stretches;already gave pics, rolling to bilateral ITB, strength, balance; HEP; heel raises, sit to stand, stretches etc   Waynette Buttery, PT 07/16/2023, 11:57 AM

## 2023-07-17 LAB — MULTIPLE MYELOMA PANEL, SERUM
Albumin SerPl Elph-Mcnc: 3.6 g/dL (ref 2.9–4.4)
Albumin/Glob SerPl: 1.3 (ref 0.7–1.7)
Alpha 1: 0.3 g/dL (ref 0.0–0.4)
Alpha2 Glob SerPl Elph-Mcnc: 0.8 g/dL (ref 0.4–1.0)
B-Globulin SerPl Elph-Mcnc: 0.9 g/dL (ref 0.7–1.3)
Gamma Glob SerPl Elph-Mcnc: 0.9 g/dL (ref 0.4–1.8)
Globulin, Total: 2.9 g/dL (ref 2.2–3.9)
IgA: 109 mg/dL (ref 64–422)
IgG (Immunoglobin G), Serum: 1015 mg/dL (ref 586–1602)
IgM (Immunoglobulin M), Srm: 38 mg/dL (ref 26–217)
Total Protein ELP: 6.5 g/dL (ref 6.0–8.5)

## 2023-07-19 ENCOUNTER — Ambulatory Visit: Payer: Medicare HMO

## 2023-07-19 DIAGNOSIS — C9002 Multiple myeloma in relapse: Secondary | ICD-10-CM

## 2023-07-19 DIAGNOSIS — R2681 Unsteadiness on feet: Secondary | ICD-10-CM

## 2023-07-19 DIAGNOSIS — R5381 Other malaise: Secondary | ICD-10-CM

## 2023-07-19 DIAGNOSIS — G620 Drug-induced polyneuropathy: Secondary | ICD-10-CM

## 2023-07-19 DIAGNOSIS — Z9481 Bone marrow transplant status: Secondary | ICD-10-CM

## 2023-07-19 NOTE — Therapy (Signed)
OUTPATIENT PHYSICAL THERAPY ONCOLOGY TREATMENT  Patient Name: Jean Davidson MRN: [id number] DOB:October 01, 1945, 78 y.o., female Today's Date: 07/19/2023  END OF SESSION:  PT End of Session - 07/19/23 1002     Visit Number 5    Number of Visits 12    Date for PT Re-Evaluation 08/10/23    PT Start Time 1003    PT Stop Time 1046    PT Time Calculation (min) 43 min    Equipment Utilized During Treatment Gait belt    Activity Tolerance Patient tolerated treatment well    Behavior During Therapy WFL for tasks assessed/performed             Past Medical History:  Diagnosis Date   Anemia    Anxiety    Blood transfusion without reported diagnosis    Carotid stenosis    Mild bilateral   Carpal tunnel syndrome    Cataract    Cervical disc disease    c7   Cough 07/27/2015   Disc degeneration    Dysuria 07/26/2015   Failure of stem cell transplant (HCC)    Fatigue 01/26/2015   Fever 07/27/2015   GERD (gastroesophageal reflux disease)    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   Multiple myeloma (HCC)    Osteopenia    Pinched nerve    Sinusitis, bacterial 07/27/2015   Spinal stenosis in cervical region    Varicose veins of lower extremities with inflammation    Past Surgical History:  Procedure Laterality Date   ANKLE SURGERY Left    BLADDER SUSPENSION  1988/1989   x2   COLONOSCOPY     ESOPHAGOGASTRODUODENOSCOPY     IR IMAGING GUIDED PORT INSERTION  03/01/2018   LIMBAL STEM CELL TRANSPLANT     x2   PORT-A-CATH REMOVAL     SHOULDER SURGERY Right 04/06/2013   right   UPPER GASTROINTESTINAL ENDOSCOPY     Patient Active Problem List   Diagnosis Date Noted   Closed left ankle fracture 06/16/2022   Fluid overload 04/17/2022   Anemia due to antineoplastic  chemotherapy 06/24/2021   Sinusitis 06/13/2021   Neutropenic fever (HCC) 04/05/2021   Hyperglycemia, drug-induced 07/29/2020   Financial difficulties 05/27/2020   Bilateral cataracts 12/04/2019   Headache disorder 09/11/2019   Weight gain 09/11/2019   Skin lesion of back 08/07/2019   Pelvic floor dysfunction in female 07/09/2019   Alopecia 07/09/2019   Squamous cell cancer of skin of left temple 06/04/2019   Exertional shortness of breath 02/14/2019   Vitamin B12 deficiency 09/25/2018   Large hiatal hernia 07/26/2018   Belching 07/26/2018   Early satiety 07/26/2018   URI (upper respiratory infection) 06/25/2018   Lung nodule seen on imaging study 06/25/2018   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/11/2018   Tinnitus of both ears 04/10/2018   Preventive measure 04/10/2018   Goals of care, counseling/discussion 02/20/2018   Pain of left scapula 12/06/2017   Vitamin D deficiency 11/12/2017   Yeast infection of the skin 09/06/2017   Family hx of colon cancer requiring screening colonoscopy 01/17/2017   Cystocele and rectocele with incomplete uterovaginal prolapse 11/22/2016   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   Bilateral pleural effusion 01/25/2016   Pericardial effusion 01/24/2016   Physical deconditioning 01/11/2016  S/P autologous bone marrow transplantation (HCC) 11/08/2015   Therapeutic drug monitoring 11/08/2015   Red blood cell antibody positive, compatible PRBC difficult to obtain 07/30/2015   Cough 07/27/2015   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   Fatigue 01/26/2015   Essential hypertension 01/26/2015   Other fatigue 01/26/2015   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/2015   Chronic recurrent sinusitis 10/23/2014   Conjunctivitis 10/23/2014   Status post allogeneic bone marrow transplant (HCC) 08/14/2014   Leukopenia due to antineoplastic chemotherapy (HCC) 08/10/2014   Patient in clinical research study  07/21/2014   Need for prophylactic vaccination and inoculation against influenza 02/24/2014   Diarrhea 02/03/2014   Leukocytosis 02/03/2014   Constipation 11/21/2013   Rash 11/21/2013   Back pain 11/21/2013   Anxiety 11/21/2013   Multiple myeloma in relapse (HCC) 11/07/2013   Pancytopenia, acquired (HCC) 10/29/2013   Thrombocytopenia (HCC) 10/29/2013   Persistent cough for 3 weeks or longer 10/02/2013   Abnormal weight loss 10/02/2013   Normocytic anemia 08/22/2013   Abdominal bloating 08/22/2013   Hypersensitivity 08/07/2013   Other malaise and fatigue 08/07/2013   Osteopenia 08/05/2013   GERD (gastroesophageal reflux disease) 01/16/2013   Insomnia disorder 01/16/2013   Carotid stenosis    Cervical disc disease    Hypertension 04/15/2012   Palpitations 03/29/2012    PCP: Lorine Bears, MD  REFERRING PROVIDER: Artis Delay, MD  REFERRING DIAG: Deconditioning  THERAPY DIAG:  Multiple myeloma in relapse (HCC)  History of bone marrow transplant (HCC)  Chemotherapy-induced neuropathy (HCC)  Physical deconditioning  Gait instability  ONSET DATE: 1 year ago  Rationale for Evaluation and Treatment: Rehabilitation  SUBJECTIVE:                                                                                                                                                                                           SUBJECTIVE STATEMENT: I did fine after last visit. No pain today, just stiff when I first get up  EVAL I am so achy in my legs the longer I move or stand.. It improves if I move around. I don't feel sure footed due to neuropathy from chemo. I wobble when I walk.I want to do more. I want to be able to sit on the floor for my 26 year old granddaughter. Balance is a concern. I can get up and down from chairs but I do it slowly and I use my arms.  Right lateral thigh feels very tight and has been since my Left ankle fx. I have fallen and broken my wrist and ring finger   and have a right reverse TSA.(2014). I don't meet my 5,000  step goal. I Feel tight in both calves. I have trouble getting out of my car because of  leg (ITB) pain on right, and difficulty putting on my socks.  PERTINENT HISTORY:  Pt has a hx of Multiple Myeloma initially diagnosed in 2015. Underwent chemotherapy with several different ones due to side effects. 07/23/2014 underwent a Bone Marrow Transplant at [facility] She has had multiple bone marrow biopsies over the years and changes of chemotherapy. She received an allogenic stem cell transplant in April 2017. She is currently on high dose IVIG treatment.  Had a CAR-T in  [facility] April. She is very deconditioned. It is just in the marrow, and not in the bone. Fx left ankle last year and had ORIF in Nov. 23.  PAIN:  Are you having pain? NO, I am still just exhausted and frustrated  PRECAUTIONS: Multiple Myeloma, macular degeneration, peripheral neuropathy, myelodisplastic syndrome,carotid stenosis(mild), Right reverse TSA, left ankle ORIF  WEIGHT BEARING RESTRICTIONS: No  FALLS:  Has patient fallen in last 6 months? No, but is at risk  LIVING ENVIRONMENT: Lives with: lives alone Lives in: House/apartment  OCCUPATION: retired Engineer, site  LEISURE: arts and Presenter, broadcasting,  HAND DOMINANCE: left   PRIOR LEVEL OF FUNCTION: Independent  PATIENT GOALS: Reduce achiness, improve confidence, improve balance,   OBJECTIVE: Note: Objective measures were completed at Evaluation unless otherwise noted.  COGNITION: Overall cognitive status: Within functional limits for tasks assessed   PALPATION: Very tender full length of bilateral ITB  OBSERVATIONS / OTHER ASSESSMENTS:    SENSATION: Light touch: deficits bilateral feet  POSTURE: forward head, rounded shoulders  UPPER EXTREMITY AROM/PROM:  A/PROM RIGHT   eval   Shoulder extension   Shoulder flexion   Shoulder abduction   Shoulder internal rotation   Shoulder external rotation      (Blank rows = not tested)  A/PROM LEFT   eval  Shoulder extension   Shoulder flexion   Shoulder abduction   Shoulder internal rotation   Shoulder external rotation     (Blank rows = not tested)  CERVICAL AROM: NT      UPPER EXTREMITY STRENGTH:    LOWER EXTREMITY AROM/PROM:  MMT Right eval  Hip flexion 4+  Hip extension Can bridge  Hip abduction 4+/5 sit  Hip adduction 4+/5 sit  Hip internal rotation 4  Hip external rotation 4+  Knee flexion 5  Knee extension 4  Ankle dorsiflexion 5  Ankle plantarflexion 5/5  Ankle inversion 4+  Ankle eversion 4+   (Blank rows = not tested)  MMT LEFT eval  Hip flexion 4  Hip extension Can bridge  Hip abduction 4+ sit  Hip adduction 4+ sit  Hip internal rotation 4  Hip external rotation 4+  Knee flexion 5  Knee extension 4  Ankle dorsiflexion 4+  Ankle plantarflexion 4  Ankle inversion 4  Ankle eversion 3+   (Blank rows = not tested)  LOWER EXTREMITY FLEXIBILITY; HS Right 48, Left 53, Very tight ITB and piriformis B, Bilateral gastroc  FUNCTIONAL TESTS:  30 sec sit to stand:  8 using hands at knees (below avg for age)  4 position balance test able to maintain 10 sec, some sway with tandem stance, left greater than right SLS  Able to turn full circle in both directions without LOB, able to stand with eyes closed 20 seconds feet slightly apart   TUG; 16 seconds, short steps, toe out, decreased heel strike.  GAIT: Distance walked: 40 Assistive device utilized: None Level  of assistance: SBA Comments: short ,choppy steps, decreased heel strike, toe out   LOWER EXTREMITY FUNCTIONAL SCALE; 22   TODAY'S TREATMENT:                                                                                                                                         DATE:   07/19/2023 Nu Step seat 6, UE 8, Lev 5  x 5 min to warm up Incline stretch at parallel bars x 3 x 20 sec Marching no hands 2 x 10 CGA on ax Heel raises  with HH x 10, x 10 no HH on ax Step up on ax x 10 forward B, x 10 sideways B with CGA Ax beam x 6 lengths forward and 6 lengths sideways 6 in step ups with HH x 10 ea Ambulated to ortho gym back wall and back, SBA with emphasis on heel strike/toe off, 303 ft 1st walk, 313 second walk Sit to stand x 10 with some use of hands on legs   07/16/2023 Nu Step seat 6, UE 8, Lev 5  x 5 min to warm up Incline stretch at parallel bars x 3 x 20 sec Marching no hands 2 x 10 CGA on ax Heel raises with HH x 10, x 10 no HH on ax Step up on ax x 10 forward B, x 10 sideways B with CGA Ambulated to ortho gym back wall and back, CGA with emphasis on heel strike/toe off Ax beam x 4 lengths ea; forward and sideways with CGA of PT Bridging x10, ppt x 10 Ppt with march x 10 Ppt with ball squeeze between knees x 7 Bilateral ITB and hamstring stretches x 3 done by PT with 20 sec hold Updated HEP 07/09/2023 Nu Step seat 7, UE 8, Lev 5  x 5 minto warm up Incline stretch at parallel bars x 3 x 20 sec Marching no hands 2 x 10 CGA Heel raises with HH x 10, x 10 no HH Step up on ax x 10 forward B, x 10 sideways B with CGA Ambulated in cancer gym with CGA emphasis on heel strike, toe off and lengthening stride slightly Bilateral piriformis stretch x 3 x 20 sec Bilateral ITB rolling in SL  07/04/2022 Nu Step seat 7, UE 8, Lev 5 to warm up Piriformis stretch x 3 B supine x 20 sec HS stretch with strap x 3 B 30 sec Gastroc stretch with strap in sitting x 3 Bilateral ITB rolling x 4 min ea in sidelying Updated HEP with pics 06/15/2023 Discussed POC, visit number, treatment interventions. Will include aquatic therapy but not to start and only if she wants to. Set timer for 6O min and get up to move around  PATIENT EDUCATION:  Access Code: [email or web address] URL: [email or web address] Date: 07/16/2023 Prepared by: Alvira Monday  Exercises - Supine Bridge  - 1 x daily - 7 x weekly -  1 sets - 10 reps -  Supine Pelvic Tilt  - 1 x daily - 7 x weekly - 1 sets - 10 reps - Supine Hip Adduction Isometric with Ball  - 1 x daily - 7 x weekly - 1 sets - 10 reps Access Code: [email or web address] URL: [email or web address] Date: 07/05/2023 Prepared by: Alvira Monday  Exercises - Supine Hamstring Stretch with Strap  - 1 x daily - 7 x weekly - 1 sets - 3 reps - 20-30 hold - Supine Hip External Rotation Stretch  - 1 x daily - 7 x weekly - 3 sets - 3 reps - 20-30 hold - Seated Calf Stretch with Strap  - 1 x daily - 7 x weekly - 1 sets - 3 reps - 20-30 hold - Supine ITB Stretch with Strap  - 1 x daily - 7 x weekly - 1 sets - 3 reps - 20 hold Education details: POC, visit number, treatment interventions. Will include aquatic therapy but not to start and only if she wants to Person educated: Patient Education method: Explanation Education comprehension: verbalized understanding  HOME EXERCISE PROGRAM: Set alarm for 60 min to get up and walk around house  ASSESSMENT:  CLINICAL IMPRESSION: Pt was fatigued after treatment today but did well with her ambulation requiring only occasional VC's for proper foot position. Balance improving on ax.  OBJECTIVE IMPAIRMENTS: Abnormal gait, decreased activity tolerance, decreased balance, decreased strength, impaired flexibility, impaired sensation, postural dysfunction, and pain.   ACTIVITY LIMITATIONS: carrying, bending, standing, squatting, stairs, and locomotion level  PARTICIPATION LIMITATIONS: cleaning, shopping, and community activity  PERSONAL FACTORS: Age and 1-2 comorbidities: Multiple Myeloma and current treatments  are also affecting patient's functional outcome.   REHAB POTENTIAL: Good  CLINICAL DECISION MAKING: Stable/uncomplicated  EVALUATION COMPLEXITY: Low  GOALS: Goals reviewed with patient? Yes  SHORT TERM GOALS: Target date: 07/12/2022  Pt will be independent with HEP to improve flexibility and strength Baseline: Goal status:  INITIAL  2.  Pt will perform TUG in 13 seconds to decrease fall risk Baseline:  Goal status: INITIAL  3.  Pt will be able to perform  9 sit to stands in 30 seconds for average rating for her age Baseline: 8 Goal status: INITIAL  4.  Pt will have decreased right ITB pain by 25% Baseline:  Goal status: INITIAL  5.  Pt will ambulate with improved stride length and heel strike for more normal gait Baseline Goal status: INITIAL   LONG TERM GOALS: Target date: 08/09/2022  Pt will be able to don socks with improved ease Baseline:  Goal status: INITIAL  2.  Pt will have decreased right ITB pain by 50% or greater Baseline:  Goal status: INITIAL  3.  Pt will be able to get out of her car without right ITB pain Baseline:  Goal status: MET 07/15/2022 4.  LEFS will be increased to 40 to demonstrate improved function Baseline:  Goal status: INITIAL  5.  Pt will perform TUG in 11 seconds  or less to demonstrate decreased fall risk Baseline:  Goal status: INITIAL  6.  Pt will perform SLS for 14 seconds on each side to achieve age related norms Baseline:  Goal status: INITIAL  PLAN:  PT FREQUENCY: 2x/week  PT DURATION: 8 weeks  PLANNED INTERVENTIONS: [id number]- PT Re-evaluation, 97110-Therapeutic exercises, 97530- Therapeutic activity, [id number]- Neuromuscular re-education, 97535- Self Care, [id number]- Manual therapy, [id number]- Gait training, and [phone]- Aquatic Therapy  PLAN FOR NEXT SESSION:  ITB stretches;already gave pics, rolling to  bilateral ITB, strength, balance; HEP; heel raises, sit to stand, stretches etc   Waynette Buttery, PT 07/19/2023, 10:51 AM

## 2023-07-23 ENCOUNTER — Ambulatory Visit: Payer: Medicare HMO

## 2023-07-23 DIAGNOSIS — R2681 Unsteadiness on feet: Secondary | ICD-10-CM

## 2023-07-23 DIAGNOSIS — G620 Drug-induced polyneuropathy: Secondary | ICD-10-CM

## 2023-07-23 DIAGNOSIS — C9002 Multiple myeloma in relapse: Secondary | ICD-10-CM | POA: Diagnosis not present

## 2023-07-23 DIAGNOSIS — Z9481 Bone marrow transplant status: Secondary | ICD-10-CM

## 2023-07-23 DIAGNOSIS — R5381 Other malaise: Secondary | ICD-10-CM

## 2023-07-23 NOTE — Therapy (Signed)
OUTPATIENT PHYSICAL THERAPY ONCOLOGY TREATMENT  Patient Name: Jean Davidson MRN: [id number] DOB:09-14-1945, 78 y.o., female Today's Date: 07/23/2023  END OF SESSION:  PT End of Session - 07/23/23 1100     Visit Number 6    Number of Visits 12    Date for PT Re-Evaluation 08/10/23    PT Start Time 1100    PT Stop Time 1155    PT Time Calculation (min) 55 min    Equipment Utilized During Treatment --    Activity Tolerance Patient tolerated treatment well    Behavior During Therapy [facility] for tasks assessed/performed             Past Medical History:  Diagnosis Date   Anemia    Anxiety    Blood transfusion without reported diagnosis    Carotid stenosis    Mild bilateral   Carpal tunnel syndrome    Cataract    Cervical disc disease    c7   Cough 07/27/2015   Disc degeneration    Dysuria 07/26/2015   Failure of stem cell transplant (HCC)    Fatigue 01/26/2015   Fever 07/27/2015   GERD (gastroesophageal reflux disease)    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   Multiple myeloma (HCC)    Osteopenia    Pinched nerve    Sinusitis, bacterial 07/27/2015   Spinal stenosis in cervical region    Varicose veins of lower extremities with inflammation    Past Surgical History:  Procedure Laterality Date   ANKLE SURGERY Left    BLADDER SUSPENSION  1988/1989   x2   COLONOSCOPY     ESOPHAGOGASTRODUODENOSCOPY     IR IMAGING GUIDED PORT INSERTION  03/01/2018   LIMBAL STEM CELL TRANSPLANT     x2   PORT-A-CATH REMOVAL     SHOULDER SURGERY Right 04/06/2013   right   UPPER GASTROINTESTINAL ENDOSCOPY     Patient Active Problem List   Diagnosis Date Noted   Closed left ankle fracture 06/16/2022   Fluid overload 04/17/2022   Anemia due to antineoplastic  chemotherapy 06/24/2021   Sinusitis 06/13/2021   Neutropenic fever (HCC) 04/05/2021   Hyperglycemia, drug-induced 07/29/2020   Financial difficulties 05/27/2020   Bilateral cataracts 12/04/2019   Headache disorder 09/11/2019   Weight gain 09/11/2019   Skin lesion of back 08/07/2019   Pelvic floor dysfunction in female 07/09/2019   Alopecia 07/09/2019   Squamous cell cancer of skin of left temple 06/04/2019   Exertional shortness of breath 02/14/2019   Vitamin B12 deficiency 09/25/2018   Large hiatal hernia 07/26/2018   Belching 07/26/2018   Early satiety 07/26/2018   URI (upper respiratory infection) 06/25/2018   Lung nodule seen on imaging study 06/25/2018   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/11/2018   Tinnitus of both ears 04/10/2018   Preventive measure 04/10/2018   Goals of care, counseling/discussion 02/20/2018   Pain of left scapula 12/06/2017   Vitamin D deficiency 11/12/2017   Yeast infection of the skin 09/06/2017   Family hx of colon cancer requiring screening colonoscopy 01/17/2017   Cystocele and rectocele with incomplete uterovaginal prolapse 11/22/2016   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   Bilateral pleural effusion 01/25/2016   Pericardial effusion 01/24/2016   Physical deconditioning 01/11/2016  S/P autologous bone marrow transplantation (HCC) 11/08/2015   Therapeutic drug monitoring 11/08/2015   Red blood cell antibody positive, compatible PRBC difficult to obtain 07/30/2015   Cough 07/27/2015   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   Fatigue 01/26/2015   Essential hypertension 01/26/2015   Other fatigue 01/26/2015   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/2015   Chronic recurrent sinusitis 10/23/2014   Conjunctivitis 10/23/2014   Status post allogeneic bone marrow transplant (HCC) 08/14/2014   Leukopenia due to antineoplastic chemotherapy (HCC) 08/10/2014   Patient in clinical research study  07/21/2014   Need for prophylactic vaccination and inoculation against influenza 02/24/2014   Diarrhea 02/03/2014   Leukocytosis 02/03/2014   Constipation 11/21/2013   Rash 11/21/2013   Back pain 11/21/2013   Anxiety 11/21/2013   Multiple myeloma in relapse (HCC) 11/07/2013   Pancytopenia, acquired (HCC) 10/29/2013   Thrombocytopenia (HCC) 10/29/2013   Persistent cough for 3 weeks or longer 10/02/2013   Abnormal weight loss 10/02/2013   Normocytic anemia 08/22/2013   Abdominal bloating 08/22/2013   Hypersensitivity 08/07/2013   Other malaise and fatigue 08/07/2013   Osteopenia 08/05/2013   GERD (gastroesophageal reflux disease) 01/16/2013   Insomnia disorder 01/16/2013   Carotid stenosis    Cervical disc disease    Hypertension 04/15/2012   Palpitations 03/29/2012    PCP: Lorine Bears, MD  REFERRING PROVIDER: Artis Delay, MD  REFERRING DIAG: Deconditioning  THERAPY DIAG:  Multiple myeloma in relapse (HCC)  History of bone marrow transplant (HCC)  Chemotherapy-induced neuropathy (HCC)  Physical deconditioning  Gait instability  ONSET DATE: 1 year ago  Rationale for Evaluation and Treatment: Rehabilitation  SUBJECTIVE:                                                                                                                                                                                           SUBJECTIVE STATEMENT: No problems after last visit.  The inside of my lower legs on both sides are tender to touch but they have been like that for decades. I went to a massage therapist for a long time but it never changed . I have been doing my exercises at home.  EVAL I am so achy in my legs the longer I move or stand.. It improves if I move around. I don't feel sure footed due to neuropathy from chemo. I wobble when I walk.I want to do more. I want to be able to sit on the floor for my 49 year old granddaughter. Balance is a concern. I can get up and down from  chairs but I do it slowly and I use my arms.  Right lateral  thigh feels very tight and has been since my Left ankle fx. I have fallen and broken my wrist and ring finger  and have a right reverse TSA.(2014). I don't meet my 5,000 step goal. I Feel tight in both calves. I have trouble getting out of my car because of  leg (ITB) pain on right, and difficulty putting on my socks.  PERTINENT HISTORY:  Pt has a hx of Multiple Myeloma initially diagnosed in 2015. Underwent chemotherapy with several different ones due to side effects. 07/23/2014 underwent a Bone Marrow Transplant at [facility] She has had multiple bone marrow biopsies over the years and changes of chemotherapy. She received an allogenic stem cell transplant in April 2017. She is currently on high dose IVIG treatment.  Had a CAR-T in  [facility] April. She is very deconditioned. It is just in the marrow, and not in the bone. Fx left ankle last year and had ORIF in Nov. 23.  PAIN:  Are you having pain? NO, I am still just exhausted and frustrated  PRECAUTIONS: Multiple Myeloma, macular degeneration, peripheral neuropathy, myelodisplastic syndrome,carotid stenosis(mild), Right reverse TSA, left ankle ORIF  WEIGHT BEARING RESTRICTIONS: No  FALLS:  Has patient fallen in last 6 months? No, but is at risk  LIVING ENVIRONMENT: Lives with: lives alone Lives in: House/apartment  OCCUPATION: retired Engineer, site  LEISURE: arts and Presenter, broadcasting,  HAND DOMINANCE: left   PRIOR LEVEL OF FUNCTION: Independent  PATIENT GOALS: Reduce achiness, improve confidence, improve balance,   OBJECTIVE: Note: Objective measures were completed at Evaluation unless otherwise noted.  COGNITION: Overall cognitive status: Within functional limits for tasks assessed   PALPATION: Very tender full length of bilateral ITB  OBSERVATIONS / OTHER ASSESSMENTS:    SENSATION: Light touch: deficits bilateral feet  POSTURE: forward head, rounded shoulders  UPPER  EXTREMITY AROM/PROM:  A/PROM RIGHT   eval   Shoulder extension   Shoulder flexion   Shoulder abduction   Shoulder internal rotation   Shoulder external rotation     (Blank rows = not tested)  A/PROM LEFT   eval  Shoulder extension   Shoulder flexion   Shoulder abduction   Shoulder internal rotation   Shoulder external rotation     (Blank rows = not tested)  CERVICAL AROM: NT      UPPER EXTREMITY STRENGTH:    LOWER EXTREMITY AROM/PROM:  MMT Right eval  Hip flexion 4+  Hip extension Can bridge  Hip abduction 4+/5 sit  Hip adduction 4+/5 sit  Hip internal rotation 4  Hip external rotation 4+  Knee flexion 5  Knee extension 4  Ankle dorsiflexion 5  Ankle plantarflexion 5/5  Ankle inversion 4+  Ankle eversion 4+   (Blank rows = not tested)  MMT LEFT eval  Hip flexion 4  Hip extension Can bridge  Hip abduction 4+ sit  Hip adduction 4+ sit  Hip internal rotation 4  Hip external rotation 4+  Knee flexion 5  Knee extension 4  Ankle dorsiflexion 4+  Ankle plantarflexion 4  Ankle inversion 4  Ankle eversion 3+   (Blank rows = not tested)  LOWER EXTREMITY FLEXIBILITY; HS Right 48, Left 53, Very tight ITB and piriformis B, Bilateral gastroc  FUNCTIONAL TESTS:  30 sec sit to stand:  8 using hands at knees (below avg for age)  4 position balance test able to maintain 10 sec, some sway with tandem stance, left greater than right SLS  Able to turn full circle in both directions without  LOB, able to stand with eyes closed 20 seconds feet slightly apart   TUG; 16 seconds, short steps, toe out, decreased heel strike.  GAIT: Distance walked: 40 Assistive device utilized: None Level of assistance: SBA Comments: short ,choppy steps, decreased heel strike, toe out   LOWER EXTREMITY FUNCTIONAL SCALE; 22   TODAY'S TREATMENT:                                                                                                                                          DATE:  07/23/2023 Nu Step seat 6, UE 8, Lev 5  x 6 min to warm up, 412 steps Incline stretch at parallel bars x 3 x 20 sec Marching no hands 2 x 10 CGA on ax Heel raises with HH x 10, x 10 no HH on ax Ax beam x 6 lengths forward and 6 lengths sideways cGA Step and hold on ax forward and sideways x 10 B with only occasional hand touch;CGA of PT Ambulated to ortho gym back wall and back, SBA with emphasis on heel strike/toe off, Stopped at stair case in ortho gym and using railing ambulated up and down 4 steps x 4 reps LT x 4 ea side Bilateral HS stretch with strap x 3 ea, 20 sec ea Rolling to bilateral ITB in SL 3 min ea    07/19/2023 Nu Step seat 6, UE 8, Lev 5  x 5 min to warm up Incline stretch at parallel bars x 3 x 20 sec Marching no hands 2 x 10 CGA on ax Heel raises with HH x 10, x 10 no HH on ax Step up on ax x 10 forward B, x 10 sideways B with CGA Ax beam x 6 lengths forward and 6 lengths sideways 6 in step ups with HH x 10 ea Ambulated to ortho gym back wall and back, SBA with emphasis on heel strike/toe off, 303 ft 1st walk, 313 second walk Sit to stand x 10 with some use of hands on legs   07/16/2023 Nu Step seat 6, UE 8, Lev 5  x 5 min to warm up Incline stretch at parallel bars x 3 x 20 sec Marching no hands 2 x 10 CGA on ax Heel raises with HH x 10, x 10 no HH on ax Step up on ax x 10 forward B, x 10 sideways B with CGA Ambulated to ortho gym back wall and back, CGA with emphasis on heel strike/toe off Ax beam x 4 lengths ea; forward and sideways with CGA of PT Bridging x10, ppt x 10 Ppt with march x 10 Ppt with ball squeeze between knees x 7 Bilateral ITB and hamstring stretches x 3 done by PT with 20 sec hold Updated HEP 07/09/2023 Nu Step seat 7, UE 8, Lev 5  x 5 minto warm up Incline stretch at parallel bars x 3 x 20 sec  Marching no hands 2 x 10 CGA Heel raises with HH x 10, x 10 no HH Step up on ax x 10 forward B, x 10 sideways B with CGA Ambulated in  cancer gym with CGA emphasis on heel strike, toe off and lengthening stride slightly Bilateral piriformis stretch x 3 x 20 sec Bilateral ITB rolling in SL  07/04/2022 Nu Step seat 7, UE 8, Lev 5 to warm up Piriformis stretch x 3 B supine x 20 sec HS stretch with strap x 3 B 30 sec Gastroc stretch with strap in sitting x 3 Bilateral ITB rolling x 4 min ea in sidelying Updated HEP with pics 06/15/2023 Discussed POC, visit number, treatment interventions. Will include aquatic therapy but not to start and only if she wants to. Set timer for 6O min and get up to move around  PATIENT EDUCATION:  Access Code: [email or web address] URL: [email or web address] Date: 07/16/2023 Prepared by: Alvira Monday  Exercises - Supine Bridge  - 1 x daily - 7 x weekly - 1 sets - 10 reps - Supine Pelvic Tilt  - 1 x daily - 7 x weekly - 1 sets - 10 reps - Supine Hip Adduction Isometric with Ball  - 1 x daily - 7 x weekly - 1 sets - 10 reps Access Code: [email or web address] URL: [email or web address] Date: 07/05/2023 Prepared by: Alvira Monday  Exercises - Supine Hamstring Stretch with Strap  - 1 x daily - 7 x weekly - 1 sets - 3 reps - 20-30 hold - Supine Hip External Rotation Stretch  - 1 x daily - 7 x weekly - 3 sets - 3 reps - 20-30 hold - Seated Calf Stretch with Strap  - 1 x daily - 7 x weekly - 1 sets - 3 reps - 20-30 hold - Supine ITB Stretch with Strap  - 1 x daily - 7 x weekly - 1 sets - 3 reps - 20 hold Education details: POC, visit number, treatment interventions. Will include aquatic therapy but not to start and only if she wants to Person educated: Patient Education method: Explanation Education comprehension: verbalized understanding  HOME EXERCISE PROGRAM: Set alarm for 60 min to get up and walk around house  ASSESSMENT:  CLINICAL IMPRESSION: Pt demonstrated improved balance with ax and was able to do step and hold with only several hand touches for balance. Able to go up and down  4 steps in ortho gym with decreased eccentric control on descent. Pt is interested in working on her arms strength as well.  OBJECTIVE IMPAIRMENTS: Abnormal gait, decreased activity tolerance, decreased balance, decreased strength, impaired flexibility, impaired sensation, postural dysfunction, and pain.   ACTIVITY LIMITATIONS: carrying, bending, standing, squatting, stairs, and locomotion level  PARTICIPATION LIMITATIONS: cleaning, shopping, and community activity  PERSONAL FACTORS: Age and 1-2 comorbidities: Multiple Myeloma and current treatments  are also affecting patient's functional outcome.   REHAB POTENTIAL: Good  CLINICAL DECISION MAKING: Stable/uncomplicated  EVALUATION COMPLEXITY: Low  GOALS: Goals reviewed with patient? Yes  SHORT TERM GOALS: Target date: 07/12/2022  Pt will be independent with HEP to improve flexibility and strength Baseline: Goal status: INITIAL  2.  Pt will perform TUG in 13 seconds to decrease fall risk Baseline:  Goal status: INITIAL  3.  Pt will be able to perform  9 sit to stands in 30 seconds for average rating for her age Baseline: 8 Goal status: INITIAL  4.  Pt will have decreased right ITB pain by 25% Baseline:  Goal status: INITIAL  5.  Pt will ambulate with improved stride length and heel strike for more normal gait Baseline Goal status: INITIAL   LONG TERM GOALS: Target date: 08/09/2022  Pt will be able to don socks with improved ease Baseline:  Goal status: INITIAL  2.  Pt will have decreased right ITB pain by 50% or greater Baseline:  Goal status: INITIAL  3.  Pt will be able to get out of her car without right ITB pain Baseline:  Goal status: MET 07/15/2022 4.  LEFS will be increased to 40 to demonstrate improved function Baseline:  Goal status: INITIAL  5.  Pt will perform TUG in 11 seconds  or less to demonstrate decreased fall risk Baseline:  Goal status: INITIAL  6.  Pt will perform SLS for 14 seconds on  each side to achieve age related norms Baseline:  Goal status: INITIAL  PLAN:  PT FREQUENCY: 2x/week  PT DURATION: 8 weeks  PLANNED INTERVENTIONS: 97164- PT Re-evaluation, 97110-Therapeutic exercises, 97530- Therapeutic activity, 97112- Neuromuscular re-education, 97535- Self Care, 14782- Manual therapy, [phone]- Gait training, and [phone]- Aquatic Therapy  PLAN FOR NEXT SESSION:  Iadd UE strength, postural band,TB stretches;already gave pics, rolling to bilateral ITB, strength, balance; HEP; heel raises, sit to stand, stretches etc   Waynette Buttery, PT 07/23/2023, 11:56 AM

## 2023-07-26 ENCOUNTER — Other Ambulatory Visit: Payer: Self-pay | Admitting: Hematology and Oncology

## 2023-07-26 ENCOUNTER — Ambulatory Visit: Payer: Medicare HMO

## 2023-07-26 DIAGNOSIS — Z9481 Bone marrow transplant status: Secondary | ICD-10-CM

## 2023-07-26 DIAGNOSIS — R2681 Unsteadiness on feet: Secondary | ICD-10-CM

## 2023-07-26 DIAGNOSIS — C9002 Multiple myeloma in relapse: Secondary | ICD-10-CM

## 2023-07-26 DIAGNOSIS — R5381 Other malaise: Secondary | ICD-10-CM

## 2023-07-26 DIAGNOSIS — T451X5A Adverse effect of antineoplastic and immunosuppressive drugs, initial encounter: Secondary | ICD-10-CM

## 2023-07-26 NOTE — Therapy (Signed)
OUTPATIENT PHYSICAL THERAPY ONCOLOGY TREATMENT  Patient Name: Jean Davidson MRN: [id number] DOB:[birth year], 78 y.o., female Today's Date: 07/26/2023  END OF SESSION:  PT End of Session - 07/26/23 1100     Visit Number 7    Number of Visits 12    Date for PT Re-Evaluation 08/10/23    PT Start Time 1101    PT Stop Time 1155    PT Time Calculation (min) 54 min    Activity Tolerance Patient tolerated treatment well    Behavior During Therapy [facility] for tasks assessed/performed             Past Medical History:  Diagnosis Date   Anemia    Anxiety    Blood transfusion without reported diagnosis    Carotid stenosis    Mild bilateral   Carpal tunnel syndrome    Cataract    Cervical disc disease    c7   Cough 07/27/2015   Disc degeneration    Dysuria 07/26/2015   Failure of stem cell transplant (HCC)    Fatigue 01/26/2015   Fever 07/27/2015   GERD (gastroesophageal reflux disease)    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   Multiple myeloma (HCC)    Osteopenia    Pinched nerve    Sinusitis, bacterial 07/27/2015   Spinal stenosis in cervical region    Varicose veins of lower extremities with inflammation    Past Surgical History:  Procedure Laterality Date   ANKLE SURGERY Left    BLADDER SUSPENSION  1988/1989   x2   COLONOSCOPY     ESOPHAGOGASTRODUODENOSCOPY     IR IMAGING GUIDED PORT INSERTION  03/01/2018   LIMBAL STEM CELL TRANSPLANT     x2   PORT-A-CATH REMOVAL     SHOULDER SURGERY Right 04/06/2013   right   UPPER GASTROINTESTINAL ENDOSCOPY     Patient Active Problem List   Diagnosis Date Noted   Closed left ankle fracture 06/16/2022   Fluid overload 04/17/2022   Anemia due to antineoplastic chemotherapy 06/24/2021   Sinusitis 06/13/2021    Neutropenic fever (HCC) 04/05/2021   Hyperglycemia, drug-induced 07/29/2020   Financial difficulties 05/27/2020   Bilateral cataracts 12/04/2019   Headache disorder 09/11/2019   Weight gain 09/11/2019   Skin lesion of back 08/07/2019   Pelvic floor dysfunction in female 07/09/2019   Alopecia 07/09/2019   Squamous cell cancer of skin of left temple 06/04/2019   Exertional shortness of breath 02/14/2019   Vitamin B12 deficiency 09/25/2018   Large hiatal hernia 07/26/2018   Belching 07/26/2018   Early satiety 07/26/2018   URI (upper respiratory infection) 06/25/2018   Lung nodule seen on imaging study 06/25/2018   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/11/2018   Tinnitus of both ears 04/10/2018   Preventive measure 04/10/2018   Goals of care, counseling/discussion 02/20/2018   Pain of left scapula 12/06/2017   Vitamin D deficiency 11/12/2017   Yeast infection of the skin 09/06/2017   Family hx of colon cancer requiring screening colonoscopy 01/17/2017   Cystocele and rectocele with incomplete uterovaginal prolapse 11/22/2016   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   Bilateral pleural effusion 01/25/2016   Pericardial effusion 01/24/2016   Physical deconditioning 01/11/2016   S/P autologous bone marrow transplantation (HCC) 11/08/2015  Therapeutic drug monitoring 11/08/2015   Red blood cell antibody positive, compatible PRBC difficult to obtain 07/30/2015   Cough 07/27/2015   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   Fatigue 01/26/2015   Essential hypertension 01/26/2015   Other fatigue 01/26/2015   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/2015   Chronic recurrent sinusitis 10/23/2014   Conjunctivitis 10/23/2014   Status post allogeneic bone marrow transplant (HCC) 08/14/2014   Leukopenia due to antineoplastic chemotherapy (HCC) 08/10/2014   Patient in clinical research study 07/21/2014   Need for prophylactic vaccination and  inoculation against influenza 02/24/2014   Diarrhea 02/03/2014   Leukocytosis 02/03/2014   Constipation 11/21/2013   Rash 11/21/2013   Back pain 11/21/2013   Anxiety 11/21/2013   Multiple myeloma in relapse (HCC) 11/07/2013   Pancytopenia, acquired (HCC) 10/29/2013   Thrombocytopenia (HCC) 10/29/2013   Persistent cough for 3 weeks or longer 10/02/2013   Abnormal weight loss 10/02/2013   Normocytic anemia 08/22/2013   Abdominal bloating 08/22/2013   Hypersensitivity 08/07/2013   Other malaise and fatigue 08/07/2013   Osteopenia 08/05/2013   GERD (gastroesophageal reflux disease) 01/16/2013   Insomnia disorder 01/16/2013   Carotid stenosis    Cervical disc disease    Hypertension 04/15/2012   Palpitations 03/29/2012    PCP: Lorine Bears, MD  REFERRING PROVIDER: Artis Delay, MD  REFERRING DIAG: Deconditioning  THERAPY DIAG:  Multiple myeloma in relapse (HCC)  History of bone marrow transplant (HCC)  Chemotherapy-induced neuropathy (HCC)  Physical deconditioning  Gait instability  ONSET DATE: 1 year ago  Rationale for Evaluation and Treatment: Rehabilitation  SUBJECTIVE:                                                                                                                                                                                           SUBJECTIVE STATEMENT: I got my new [employer] on Tuesday. I went to [employer] and did a lot of walking, but my back didn't hurt. 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 The ITB is better and feels looser. EVAL I am so achy in my legs the longer I move or stand.. It improves if I move around. I don't feel sure footed due to neuropathy from chemo. I wobble when I walk.I want to do more. I want to be able to sit on the floor for my 78 year old granddaughter. Balance is a concern. I can get up and down from chairs but I do it slowly and I use my arms.  Right lateral thigh feels very tight and has been  since my Left ankle fx. I have fallen and broken  my wrist and ring finger  and have a right reverse TSA.(2014). I don't meet my 5,000 step goal. I Feel tight in both calves. I have trouble getting out of my car because of  leg (ITB) pain on right, and difficulty putting on my socks.  PERTINENT HISTORY:  Pt has a hx of Multiple Myeloma initially diagnosed in 2015. Underwent chemotherapy with several different ones due to side effects. 07/23/2014 underwent a Bone Marrow Transplant at [facility] She has had multiple bone marrow biopsies over the years and changes of chemotherapy. She received an allogenic stem cell transplant in April 2017. She is currently on high dose IVIG treatment.  Had a CAR-T in  [facility] April. She is very deconditioned. It is just in the marrow, and not in the bone. Fx left ankle last year and had ORIF in Nov. 23.  PAIN:  Are you having pain? NO,   PRECAUTIONS: Multiple Myeloma, macular degeneration, peripheral neuropathy, myelodisplastic syndrome,carotid stenosis(mild), Right reverse TSA, left ankle ORIF  WEIGHT BEARING RESTRICTIONS: No  FALLS:  Has patient fallen in last 6 months? No, but is at risk  LIVING ENVIRONMENT: Lives with: lives alone Lives in: House/apartment  OCCUPATION: retired Engineer, site  LEISURE: arts and Presenter, broadcasting,  HAND DOMINANCE: left   PRIOR LEVEL OF FUNCTION: Independent  PATIENT GOALS: Reduce achiness, improve confidence, improve balance,   OBJECTIVE: Note: Objective measures were completed at Evaluation unless otherwise noted.  COGNITION: Overall cognitive status: Within functional limits for tasks assessed   PALPATION: Very tender full length of bilateral ITB  OBSERVATIONS / OTHER ASSESSMENTS:    SENSATION: Light touch: deficits bilateral feet  POSTURE: forward head, rounded shoulders  UPPER EXTREMITY AROM/PROM:  A/PROM RIGHT   eval   Shoulder extension   Shoulder flexion   Shoulder abduction   Shoulder internal  rotation   Shoulder external rotation     (Blank rows = not tested)  A/PROM LEFT   eval  Shoulder extension   Shoulder flexion   Shoulder abduction   Shoulder internal rotation   Shoulder external rotation     (Blank rows = not tested)  CERVICAL AROM: NT      UPPER EXTREMITY STRENGTH:    LOWER EXTREMITY AROM/PROM:  MMT Right eval  Hip flexion 4+  Hip extension Can bridge  Hip abduction 4+/5 sit  Hip adduction 4+/5 sit  Hip internal rotation 4  Hip external rotation 4+  Knee flexion 5  Knee extension 4  Ankle dorsiflexion 5  Ankle plantarflexion 5/5  Ankle inversion 4+  Ankle eversion 4+   (Blank rows = not tested)  MMT LEFT eval  Hip flexion 4  Hip extension Can bridge  Hip abduction 4+ sit  Hip adduction 4+ sit  Hip internal rotation 4  Hip external rotation 4+  Knee flexion 5  Knee extension 4  Ankle dorsiflexion 4+  Ankle plantarflexion 4  Ankle inversion 4  Ankle eversion 3+   (Blank rows = not tested)  LOWER EXTREMITY FLEXIBILITY; HS Right 48, Left 53, Very tight ITB and piriformis B, Bilateral gastroc  FUNCTIONAL TESTS:  30 sec sit to stand:  8 using hands at knees (below avg for age)  4 position balance test able to maintain 10 sec, some sway with tandem stance, left greater than right SLS  Able to turn full circle in both directions without LOB, able to stand with eyes closed 20 seconds feet slightly apart   TUG; 16 seconds, short steps, toe out, decreased heel  strike.  GAIT: Distance walked: 40 Assistive device utilized: None Level of assistance: SBA Comments: short ,choppy steps, decreased heel strike, toe out   LOWER EXTREMITY FUNCTIONAL SCALE; 22   TODAY'S TREATMENT:                                                                                                                                         DATE:   07/26/2023 Nu Step seat 6, UE 8, Lev 5  x 7 min to warm up, 518 steps Incline stretch at parallel bars x 3 x 20  sec Marching no hands 2 x 10 CGA  Heel raises with  x 15 no HH on ax Vector reaches x 5 ea B with toe touch, x 3 without toe touch Ax beam x 6 lengths forward and 6 lengths sideways cGA Ambulated to ortho gym and went up and down 4 steps with railing and reciprocal gait, and ambulaed back with emphasis on increasing step length Sit to stand from chair 10 with emphasis on head over feet and control Postural theraband;scapular retraction red x 10, bilateral extension red x 10, Bilateral ER with red x 5, yellow  x 5  07/23/2023 Nu Step seat 6, UE 8, Lev 5  x 6 min to warm up, 412 steps Incline stretch at parallel bars x 3 x 20 sec Marching no hands 2 x 10 CGA on ax Heel raises with HH x 10, x 10 no HH on ax Ax beam x 6 lengths forward and 6 lengths sideways cGA Step and hold on ax forward and sideways x 10 B with only occasional hand touch;CGA of PT Ambulated to ortho gym back wall and back, SBA with emphasis on heel strike/toe off, Stopped at stair case in ortho gym and using railing ambulated up and down 4 steps x 4 reps LT x 4 ea side Bilateral HS stretch with strap x 3 ea, 20 sec ea Rolling to bilateral ITB in SL 3 min ea    07/19/2023 Nu Step seat 6, UE 8, Lev 5  x 5 min to warm up Incline stretch at parallel bars x 3 x 20 sec Marching no hands 2 x 10 CGA on ax Heel raises with HH x 10, x 10 no HH on ax Step up on ax x 10 forward B, x 10 sideways B with CGA Ax beam x 6 lengths forward and 6 lengths sideways 6 in step ups with HH x 10 ea Ambulated to ortho gym back wall and back, SBA with emphasis on heel strike/toe off, 303 ft 1st walk, 313 second walk Sit to stand x 10 with some use of hands on legs   07/16/2023 Nu Step seat 6, UE 8, Lev 5  x 5 min to warm up Incline stretch at parallel bars x 3 x 20 sec Marching no hands 2 x 10 CGA on ax Heel raises with  HH x 10, x 10 no HH on ax Step up on ax x 10 forward B, x 10 sideways B with CGA Ambulated to ortho gym back wall and  back, CGA with emphasis on heel strike/toe off Ax beam x 4 lengths ea; forward and sideways with CGA of PT Bridging x10, ppt x 10 Ppt with march x 10 Ppt with ball squeeze between knees x 7 Bilateral ITB and hamstring stretches x 3 done by PT with 20 sec hold Updated HEP 07/09/2023 Nu Step seat 7, UE 8, Lev 5  x 5 minto warm up Incline stretch at parallel bars x 3 x 20 sec Marching no hands 2 x 10 CGA Heel raises with HH x 10, x 10 no HH Step up on ax x 10 forward B, x 10 sideways B with CGA Ambulated in cancer gym with CGA emphasis on heel strike, toe off and lengthening stride slightly Bilateral piriformis stretch x 3 x 20 sec Bilateral ITB rolling in SL  07/04/2022 Nu Step seat 7, UE 8, Lev 5 to warm up Piriformis stretch x 3 B supine x 20 sec HS stretch with strap x 3 B 30 sec Gastroc stretch with strap in sitting x 3 Bilateral ITB rolling x 4 min ea in sidelying Updated HEP with pics 06/15/2023 Discussed POC, visit number, treatment interventions. Will include aquatic therapy but not to start and only if she wants to. Set timer for 6O min and get up to move around  PATIENT EDUCATION:  Access Code: [email or web address] URL: [email or web address] Date: 07/16/2023 Prepared by: Alvira Monday  Exercises - Supine Bridge  - 1 x daily - 7 x weekly - 1 sets - 10 reps - Supine Pelvic Tilt  - 1 x daily - 7 x weekly - 1 sets - 10 reps - Supine Hip Adduction Isometric with Ball  - 1 x daily - 7 x weekly - 1 sets - 10 reps Access Code: [email or web address] URL: [email or web address] Date: 07/05/2023 Prepared by: Alvira Monday  Exercises - Supine Hamstring Stretch with Strap  - 1 x daily - 7 x weekly - 1 sets - 3 reps - 20-30 hold - Supine Hip External Rotation Stretch  - 1 x daily - 7 x weekly - 3 sets - 3 reps - 20-30 hold - Seated Calf Stretch with Strap  - 1 x daily - 7 x weekly - 1 sets - 3 reps - 20-30 hold - Supine ITB Stretch with Strap  - 1 x daily - 7 x weekly - 1 sets  - 3 reps - 20 hold Education details: POC, visit number, treatment interventions. Will include aquatic therapy but not to start and only if she wants to Person educated: Patient Education method: Explanation Education comprehension: verbalized understanding  HOME EXERCISE PROGRAM: Set alarm for 60 min to get up and walk around house  ASSESSMENT:  CLINICAL IMPRESSION:  Pt did well with postural theraband instructed and added to HEP, but cautioned with ER not to try and go to far. Sit to stand and weight shifting nicely improved today with good form. Pt still has decreased eccentric strength for stairs and fatigues with increased repetitions. OBJECTIVE IMPAIRMENTS: Abnormal gait, decreased activity tolerance, decreased balance, decreased strength, impaired flexibility, impaired sensation, postural dysfunction, and pain.   ACTIVITY LIMITATIONS: carrying, bending, standing, squatting, stairs, and locomotion level  PARTICIPATION LIMITATIONS: cleaning, shopping, and community activity  PERSONAL FACTORS: Age and 1-2 comorbidities: Multiple Myeloma and current treatments  are also affecting patient's functional outcome.  REHAB POTENTIAL: Good  CLINICAL DECISION MAKING: Stable/uncomplicated  EVALUATION COMPLEXITY: Low  GOALS: Goals reviewed with patient? Yes  SHORT TERM GOALS: Target date: 07/12/2022  Pt will be independent with HEP to improve flexibility and strength Baseline: Goal status: INITIAL  2.  Pt will perform TUG in 13 seconds to decrease fall risk Baseline:  Goal status: INITIAL  3.  Pt will be able to perform  9 sit to stands in 30 seconds for average rating for her age Baseline: 8 Goal status: INITIAL  4.  Pt will have decreased right ITB pain by 25% Baseline:  Goal status: INITIAL  5.  Pt will ambulate with improved stride length and heel strike for more normal gait Baseline Goal status: INITIAL   LONG TERM GOALS: Target date: 08/09/2022  Pt will be able to  don socks with improved ease Baseline:  Goal status: INITIAL  2.  Pt will have decreased right ITB pain by 50% or greater Baseline:  Goal status: INITIAL  3.  Pt will be able to get out of her car without right ITB pain Baseline:  Goal status: MET 07/15/2022 4.  LEFS will be increased to 40 to demonstrate improved function Baseline:  Goal status: INITIAL  5.  Pt will perform TUG in 11 seconds  or less to demonstrate decreased fall risk Baseline:  Goal status: INITIAL  6.  Pt will perform SLS for 14 seconds on each side to achieve age related norms Baseline:  Goal status: INITIAL  PLAN:  PT FREQUENCY: 2x/week  PT DURATION: 8 weeks  PLANNED INTERVENTIONS: 97164- PT Re-evaluation, 97110-Therapeutic exercises, 97530- Therapeutic activity, 97112- Neuromuscular re-education, 97535- Self Care, 16109- Manual therapy, [phone]- Gait training, and [phone]- Aquatic Therapy  PLAN FOR NEXT SESSION:  Iadd UE strength, postural band,TB stretches;already gave pics, rolling to bilateral ITB, strength, balance; HEP; heel raises, sit to stand, stretches etc   Waynette Buttery, PT 07/26/2023, 11:56 AM

## 2023-07-29 ENCOUNTER — Other Ambulatory Visit: Payer: Self-pay | Admitting: Cardiovascular Disease

## 2023-07-31 ENCOUNTER — Encounter: Payer: Self-pay | Admitting: Hematology and Oncology

## 2023-07-31 ENCOUNTER — Ambulatory Visit: Payer: Medicare HMO | Attending: Hematology and Oncology

## 2023-07-31 DIAGNOSIS — C9002 Multiple myeloma in relapse: Secondary | ICD-10-CM | POA: Diagnosis present

## 2023-07-31 DIAGNOSIS — Z9481 Bone marrow transplant status: Secondary | ICD-10-CM | POA: Insufficient documentation

## 2023-07-31 DIAGNOSIS — T451X5A Adverse effect of antineoplastic and immunosuppressive drugs, initial encounter: Secondary | ICD-10-CM | POA: Diagnosis present

## 2023-07-31 DIAGNOSIS — G620 Drug-induced polyneuropathy: Secondary | ICD-10-CM | POA: Insufficient documentation

## 2023-07-31 DIAGNOSIS — R5381 Other malaise: Secondary | ICD-10-CM | POA: Diagnosis present

## 2023-07-31 DIAGNOSIS — R2681 Unsteadiness on feet: Secondary | ICD-10-CM | POA: Diagnosis present

## 2023-07-31 NOTE — Therapy (Signed)
 OUTPATIENT PHYSICAL THERAPY ONCOLOGY TREATMENT  Patient Name: Jean Davidson MRN: [id number] DOB:1945-09-02, 78 y.o., female Today's Date: 07/31/2023  END OF SESSION:  PT End of Session - 07/31/23 1001     Visit Number 8    Number of Visits 12    Date for PT Re-Evaluation 08/10/23    PT Start Time 1002    PT Stop Time 1050    PT Time Calculation (min) 48 min    Activity Tolerance Patient tolerated treatment well    Behavior During Therapy [facility] for tasks assessed/performed             Past Medical History:  Diagnosis Date   Anemia    Anxiety    Blood transfusion without reported diagnosis    Carotid stenosis    Mild bilateral   Carpal tunnel syndrome    Cataract    Cervical disc disease    c7   Cough 07/27/2015   Disc degeneration    Dysuria 07/26/2015   Failure of stem cell transplant (HCC)    Fatigue 01/26/2015   Fever 07/27/2015   GERD (gastroesophageal reflux disease)    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   Multiple myeloma (HCC)    Osteopenia    Pinched nerve    Sinusitis, bacterial 07/27/2015   Spinal stenosis in cervical region    Varicose veins of lower extremities with inflammation    Past Surgical History:  Procedure Laterality Date   ANKLE SURGERY Left    BLADDER SUSPENSION  1988/1989   x2   COLONOSCOPY     ESOPHAGOGASTRODUODENOSCOPY     IR IMAGING GUIDED PORT INSERTION  03/01/2018   LIMBAL STEM CELL TRANSPLANT     x2   PORT-A-CATH REMOVAL     SHOULDER SURGERY Right 04/06/2013   right   UPPER GASTROINTESTINAL ENDOSCOPY     Patient Active Problem List   Diagnosis Date Noted   Closed left ankle fracture 06/16/2022   Fluid overload 04/17/2022   Anemia due to antineoplastic chemotherapy 06/24/2021   Sinusitis 06/13/2021    Neutropenic fever (HCC) 04/05/2021   Hyperglycemia, drug-induced 07/29/2020   Financial difficulties 05/27/2020   Bilateral cataracts 12/04/2019   Headache disorder 09/11/2019   Weight gain 09/11/2019   Skin lesion of back 08/07/2019   Pelvic floor dysfunction in female 07/09/2019   Alopecia 07/09/2019   Squamous cell cancer of skin of left temple 06/04/2019   Exertional shortness of breath 02/14/2019   Vitamin B12 deficiency 09/25/2018   Large hiatal hernia 07/26/2018   Belching 07/26/2018   Early satiety 07/26/2018   URI (upper respiratory infection) 06/25/2018   Lung nodule seen on imaging study 06/25/2018   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/11/2018   Tinnitus of both ears 04/10/2018   Preventive measure 04/10/2018   Goals of care, counseling/discussion 02/20/2018   Pain of left scapula 12/06/2017   Vitamin D  deficiency 11/12/2017   Yeast infection of the skin 09/06/2017   Family hx of colon cancer requiring screening colonoscopy 01/17/2017   Cystocele and rectocele with incomplete uterovaginal prolapse 11/22/2016   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   Bilateral pleural effusion 01/25/2016   Pericardial effusion 01/24/2016   Physical deconditioning 01/11/2016   S/P autologous bone marrow transplantation (HCC) 11/08/2015  Therapeutic drug monitoring 11/08/2015   Red blood cell antibody positive, compatible PRBC difficult to obtain 07/30/2015   Cough 07/27/2015   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   Fatigue 01/26/2015   Essential hypertension 01/26/2015   Other fatigue 01/26/2015   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/2015   Chronic recurrent sinusitis 10/23/2014   Conjunctivitis 10/23/2014   Status post allogeneic bone marrow transplant (HCC) 08/14/2014   Leukopenia due to antineoplastic chemotherapy (HCC) 08/10/2014   Patient in clinical research study 07/21/2014   Need for prophylactic vaccination and  inoculation against influenza 02/24/2014   Diarrhea 02/03/2014   Leukocytosis 02/03/2014   Constipation 11/21/2013   Rash 11/21/2013   Back pain 11/21/2013   Anxiety 11/21/2013   Multiple myeloma in relapse (HCC) 11/07/2013   Pancytopenia, acquired (HCC) 10/29/2013   Thrombocytopenia (HCC) 10/29/2013   Persistent cough for 3 weeks or longer 10/02/2013   Abnormal weight loss 10/02/2013   Normocytic anemia 08/22/2013   Abdominal bloating 08/22/2013   Hypersensitivity 08/07/2013   Other malaise and fatigue 08/07/2013   Osteopenia 08/05/2013   GERD (gastroesophageal reflux disease) 01/16/2013   Insomnia disorder 01/16/2013   Carotid stenosis    Cervical disc disease    Hypertension 04/15/2012   Palpitations 03/29/2012    PCP: Deatrice Cage, MD  REFERRING PROVIDER: Almarie Bedford, MD  REFERRING DIAG: Deconditioning  THERAPY DIAG:  Multiple myeloma in relapse (HCC)  History of bone marrow transplant (HCC)  Chemotherapy-induced neuropathy (HCC)  Physical deconditioning  Gait instability  ONSET DATE: 1 year ago  Rationale for Evaluation and Treatment: Rehabilitation  SUBJECTIVE:                                                                                                                                                                                           SUBJECTIVE STATEMENT: I love my new Skechers. I would like to buy a balance pad. Can you tell me if this is a good one? I was able to put socks on without pulling my foot onto the bed because the ITB didn't hurt. Tenderness is better too.  EVAL I am so achy in my legs the longer I move or stand.. It improves if I move around. I don't feel sure footed due to neuropathy from chemo. I wobble when I walk.I want to do more. I want to be able to sit on the floor for my 66 year old granddaughter. Balance is a concern. I can get up and down from chairs but I do it slowly and I use my arms.  Right lateral thigh feels very tight  and has been since my Left ankle fx. I  have fallen and broken my wrist and ring finger  and have a right reverse TSA.(2014). I don't meet my 5,000 step goal. I Feel tight in both calves. I have trouble getting out of my car because of  leg (ITB) pain on right, and difficulty putting on my socks.  PERTINENT HISTORY:  Pt has a hx of Multiple Myeloma initially diagnosed in 2015. Underwent chemotherapy with several different ones due to side effects. 07/23/2014 underwent a Bone Marrow Transplant at [facility] She has had multiple bone marrow biopsies over the years and changes of chemotherapy. She received an allogenic stem cell transplant in April 2017. She is currently on high dose IVIG treatment.  Had a CAR-T in  [facility] April. She is very deconditioned. It is just in the marrow, and not in the bone. Fx left ankle last year and had ORIF in Nov. 23.  PAIN:  Are you having pain? NO,   PRECAUTIONS: Multiple Myeloma, macular degeneration, peripheral neuropathy, myelodisplastic syndrome,carotid stenosis(mild), Right reverse TSA, left ankle ORIF  WEIGHT BEARING RESTRICTIONS: No  FALLS:  Has patient fallen in last 6 months? No, but is at risk  LIVING ENVIRONMENT: Lives with: lives alone Lives in: House/apartment  OCCUPATION: retired engineer, site  LEISURE: arts and presenter, broadcasting,  HAND DOMINANCE: left   PRIOR LEVEL OF FUNCTION: Independent  PATIENT GOALS: Reduce achiness, improve confidence, improve balance,   OBJECTIVE: Note: Objective measures were completed at Evaluation unless otherwise noted.  COGNITION: Overall cognitive status: Within functional limits for tasks assessed   PALPATION: Very tender full length of bilateral ITB  OBSERVATIONS / OTHER ASSESSMENTS:    SENSATION: Light touch: deficits bilateral feet  POSTURE: forward head, rounded shoulders  UPPER EXTREMITY AROM/PROM:  A/PROM RIGHT   eval   Shoulder extension   Shoulder flexion   Shoulder abduction   Shoulder  internal rotation   Shoulder external rotation     (Blank rows = not tested)  A/PROM LEFT   eval  Shoulder extension   Shoulder flexion   Shoulder abduction   Shoulder internal rotation   Shoulder external rotation     (Blank rows = not tested)  CERVICAL AROM: NT      UPPER EXTREMITY STRENGTH:    LOWER EXTREMITY AROM/PROM:  MMT Right eval  Hip flexion 4+  Hip extension Can bridge  Hip abduction 4+/5 sit  Hip adduction 4+/5 sit  Hip internal rotation 4  Hip external rotation 4+  Knee flexion 5  Knee extension 4  Ankle dorsiflexion 5  Ankle plantarflexion 5/5  Ankle inversion 4+  Ankle eversion 4+   (Blank rows = not tested)  MMT LEFT eval  Hip flexion 4  Hip extension Can bridge  Hip abduction 4+ sit  Hip adduction 4+ sit  Hip internal rotation 4  Hip external rotation 4+  Knee flexion 5  Knee extension 4  Ankle dorsiflexion 4+  Ankle plantarflexion 4  Ankle inversion 4  Ankle eversion 3+   (Blank rows = not tested)  LOWER EXTREMITY FLEXIBILITY; HS Right 48, Left 53, Very tight ITB and piriformis B, Bilateral gastroc  FUNCTIONAL TESTS:  30 sec sit to stand:  8 using hands at knees (below avg for age)  4 position balance test able to maintain 10 sec, some sway with tandem stance, left greater than right SLS  Able to turn full circle in both directions without LOB, able to stand with eyes closed 20 seconds feet slightly apart   TUG; 16 seconds, short steps,  toe out, decreased heel strike.  GAIT: Distance walked: 40 Assistive device utilized: None Level of assistance: SBA Comments: short ,choppy steps, decreased heel strike, toe out   LOWER EXTREMITY FUNCTIONAL SCALE; 22   TODAY'S TREATMENT:                                                                                                                                         DATE:  07/31/2023 Nu Step seat 6, UE 8, Lev 6  x 5 min to warm up, 333 steps Incline stretch at parallel bars x 3 x 20  sec Marching no hands 2 x 10 CGA on ax Heel raises with  x 15 no HH on ax Tandem stance x 2 ea on ax x 30 sec, 1-2 quick hand touches 2nd trial for each Vector reaches x 5 B Sit to stand x 5 Ambulated to ortho gym, 4 steps with reciprocal gait x 4 up and down and back to cancer gym with longer step lengths and better arm swing. Postural theraband;scapular retraction red x 10, bilateral extension red x 10, Bilateral ER yellow x 5, single arm yellow x 5 Sit to stand x 5 arms crossed Ambulated ortho gym and back with emphasis again on increased step length and arm swing. Airex beam 6 lengths forward with intermittent hand touch and 6 lengths sideways intermittent hand touch 07/26/2023 Nu Step seat 6, UE 8, Lev 5  x 7 min to warm up, 518 steps Incline stretch at parallel bars x 3 x 20 sec Marching no hands 2 x 10 CGA  Heel raises with  x 15 no HH on ax Vector reaches x 5 ea B with toe touch, x 3 without toe touch Ax beam x 6 lengths forward and 6 lengths sideways cGA Ambulated to ortho gym and went up and down 4 steps with railing and reciprocal gait, and ambulated back with emphasis on increasing step length Sit to stand from chair 10 with emphasis on head over feet and control Postural theraband;scapular retraction red x 10, bilateral extension red x 10, Bilateral ER with red x 5, yellow  x 5  07/23/2023 Nu Step seat 6, UE 8, Lev 5  x 6 min to warm up, 412 steps Incline stretch at parallel bars x 3 x 20 sec Marching no hands 2 x 10 CGA on ax Heel raises with HH x 10, x 10 no HH on ax Ax beam x 6 lengths forward and 6 lengths sideways cGA Step and hold on ax forward and sideways x 10 B with only occasional hand touch;CGA of PT Ambulated to ortho gym back wall and back, SBA with emphasis on heel strike/toe off, Stopped at stair case in ortho gym and using railing ambulated up and down 4 steps x 4 reps LT x 4 ea side Bilateral HS stretch with strap x 3 ea, 20 sec ea Rolling to  bilateral ITB  in SL 3 min ea    07/19/2023 Nu Step seat 6, UE 8, Lev 5  x 5 min to warm up Incline stretch at parallel bars x 3 x 20 sec Marching no hands 2 x 10 CGA on ax Heel raises with HH x 10, x 10 no HH on ax Step up on ax x 10 forward B, x 10 sideways B with CGA Ax beam x 6 lengths forward and 6 lengths sideways 6 in step ups with HH x 10 ea Ambulated to ortho gym back wall and back, SBA with emphasis on heel strike/toe off, 303 ft 1st walk, 313 second walk Sit to stand x 10 with some use of hands on legs   07/16/2023 Nu Step seat 6, UE 8, Lev 5  x 5 min to warm up Incline stretch at parallel bars x 3 x 20 sec Marching no hands 2 x 10 CGA on ax Heel raises with HH x 10, x 10 no HH on ax Step up on ax x 10 forward B, x 10 sideways B with CGA Ambulated to ortho gym back wall and back, CGA with emphasis on heel strike/toe off Ax beam x 4 lengths ea; forward and sideways with CGA of PT Bridging x10, ppt x 10 Ppt with march x 10 Ppt with ball squeeze between knees x 7 Bilateral ITB and hamstring stretches x 3 done by PT with 20 sec hold Updated HEP 07/09/2023 Nu Step seat 7, UE 8, Lev 5  x 5 minto warm up Incline stretch at parallel bars x 3 x 20 sec Marching no hands 2 x 10 CGA Heel raises with HH x 10, x 10 no HH Step up on ax x 10 forward B, x 10 sideways B with CGA Ambulated in cancer gym with CGA emphasis on heel strike, toe off and lengthening stride slightly Bilateral piriformis stretch x 3 x 20 sec Bilateral ITB rolling in SL  07/04/2022 Nu Step seat 7, UE 8, Lev 5 to warm up Piriformis stretch x 3 B supine x 20 sec HS stretch with strap x 3 B 30 sec Gastroc stretch with strap in sitting x 3 Bilateral ITB rolling x 4 min ea in sidelying Updated HEP with pics 06/15/2023 Discussed POC, visit number, treatment interventions. Will include aquatic therapy but not to start and only if she wants to. Set timer for 6O min and get up to move around  PATIENT EDUCATION:  Access Code:  [email or web address] URL: [email or web address] Date: 07/16/2023 Prepared by: Grayce Sheldon  Exercises - Supine Bridge  - 1 x daily - 7 x weekly - 1 sets - 10 reps - Supine Pelvic Tilt  - 1 x daily - 7 x weekly - 1 sets - 10 reps - Supine Hip Adduction Isometric with Ball  - 1 x daily - 7 x weekly - 1 sets - 10 reps Access Code: [email or web address] URL: [email or web address] Date: 07/05/2023 Prepared by: Grayce Sheldon  Exercises - Supine Hamstring Stretch with Strap  - 1 x daily - 7 x weekly - 1 sets - 3 reps - 20-30 hold - Supine Hip External Rotation Stretch  - 1 x daily - 7 x weekly - 3 sets - 3 reps - 20-30 hold - Seated Calf Stretch with Strap  - 1 x daily - 7 x weekly - 1 sets - 3 reps - 20-30 hold - Supine ITB Stretch with Strap  - 1 x daily - 7 x weekly - 1 sets -  3 reps - 20 hold Education details: POC, visit number, treatment interventions. Will include aquatic therapy but not to start and only if she wants to Person educated: Patient Education method: Explanation Education comprehension: verbalized understanding  HOME EXERCISE PROGRAM: Set alarm for 60 min to get up and walk around house  ASSESSMENT:  CLINICAL IMPRESSION: Pt improved with stairs today with better control and did not hit heel on step. She continues to ambulate with more normal gait pattern and improved step length. ER with theraband is weak bilaterally so switched to 1 hand at a time instead of bilateral. Balance improving and pt did well with ax pad for tandem stance and sidestepping on ax beam without LOB. OBJECTIVE IMPAIRMENTS: Abnormal gait, decreased activity tolerance, decreased balance, decreased strength, impaired flexibility, impaired sensation, postural dysfunction, and pain.   ACTIVITY LIMITATIONS: carrying, bending, standing, squatting, stairs, and locomotion level  PARTICIPATION LIMITATIONS: cleaning, shopping, and community activity  PERSONAL FACTORS: Age and 1-2 comorbidities:  Multiple Myeloma and current treatments  are also affecting patient's functional outcome.   REHAB POTENTIAL: Good  CLINICAL DECISION MAKING: Stable/uncomplicated  EVALUATION COMPLEXITY: Low  GOALS: Goals reviewed with patient? Yes  SHORT TERM GOALS: Target date: 07/12/2022  Pt will be independent with HEP to improve flexibility and strength Baseline: Goal status: INITIAL  2.  Pt will perform TUG in 13 seconds to decrease fall risk Baseline:  Goal status: INITIAL  3.  Pt will be able to perform  9 sit to stands in 30 seconds for average rating for her age Baseline: 8 Goal status: INITIAL  4.  Pt will have decreased right ITB pain by 25% Baseline:  Goal status: INITIAL  5.  Pt will ambulate with improved stride length and heel strike for more normal gait Baseline Goal status: INITIAL   LONG TERM GOALS: Target date: 08/09/2022  Pt will be able to don socks with improved ease Baseline:  Goal status: INITIAL  2.  Pt will have decreased right ITB pain by 50% or greater Baseline:  Goal status: INITIAL  3.  Pt will be able to get out of her car without right ITB pain Baseline:  Goal status: MET 07/15/2022 4.  LEFS will be increased to 40 to demonstrate improved function Baseline:  Goal status: INITIAL  5.  Pt will perform TUG in 11 seconds  or less to demonstrate decreased fall risk Baseline:  Goal status: INITIAL  6.  Pt will perform SLS for 14 seconds on each side to achieve age related norms Baseline:  Goal status: INITIAL  PLAN:  PT FREQUENCY: 2x/week  PT DURATION: 8 weeks  PLANNED INTERVENTIONS: 97164- PT Re-evaluation, 97110-Therapeutic exercises, 97530- Therapeutic activity, 97112- Neuromuscular re-education, 97535- Self Care, [id number]- Manual therapy, [phone]- Gait training, and [phone]- Aquatic Therapy  PLAN FOR NEXT SESSION:  Iadd UE strength, postural band,TB stretches;already gave pics, rolling to bilateral ITB, strength, balance; HEP; heel raises, sit to  stand, stretches etc   Grayce JINNY Sheldon, PT 07/31/2023, 10:56 AM

## 2023-07-31 NOTE — Telephone Encounter (Signed)
 Please contact pt for future appointment. Pt due for 6 month f/u.

## 2023-08-01 ENCOUNTER — Encounter: Payer: Self-pay | Admitting: Medical

## 2023-08-01 ENCOUNTER — Ambulatory Visit: Payer: Medicare HMO | Attending: Medical | Admitting: Medical

## 2023-08-01 VITALS — BP 124/72 | HR 108 | Ht 62.0 in | Wt 162.6 lb

## 2023-08-01 DIAGNOSIS — I10 Essential (primary) hypertension: Secondary | ICD-10-CM | POA: Diagnosis not present

## 2023-08-01 DIAGNOSIS — C9002 Multiple myeloma in relapse: Secondary | ICD-10-CM | POA: Diagnosis not present

## 2023-08-01 DIAGNOSIS — I3139 Other pericardial effusion (noninflammatory): Secondary | ICD-10-CM

## 2023-08-01 DIAGNOSIS — I471 Supraventricular tachycardia, unspecified: Secondary | ICD-10-CM

## 2023-08-01 MED ORDER — METOPROLOL TARTRATE 25 MG PO TABS: ORAL_TABLET | ORAL | 3 refills | Status: DC

## 2023-08-01 NOTE — Progress Notes (Signed)
 Cardiology Office Note:  .   Date:  08/01/2023  ID:  Jean Davidson, DOB 05/05/1946, MRN [id number] PCP: Patient, [name]  [facility] Providers Cardiologist:  Deatrice Cage, MD {  History of Present Illness: .   Jean Davidson is a 78 y.o. female with a history of paroxysmal SVT, multiple myeloma status post bone marrow transplant in 2016 and 2020, pericardial effusion that did not require intervention, hypertension, anemia, cervical spine stenosis.  Patient has a history of palpitations.  Prior heart monitor showed frequent runs of short SVT.  Echocardiogram in October 2022 showed normal LVSF with no significant valvular abnormalities.  Her symptoms improved with metoprolol .  Patient was last seen in February 2024 was overall doing well from a cardiac perspective.  Today, the patient reports she is overall doing good. In [place] she had CAR-T, where T cells were. She is still recuperating strength from an ankle break 2023. She denies, chest pain, SOB. Occasional LLE. No lightheadedness, dizziness, orthopnea, pnd, palpitations. Pulse 108bpm, but EKG shows NSR iwht HR 93bpm. She will likely require chemotherapy again. She needs refill of metoprolol .    Studies Reviewed: SABRA   EKG Interpretation Date/Time:  Wednesday August 01 2023 14:36:39 EST Ventricular Rate:  93 PR Interval:  126 QRS Duration:  80 QT Interval:  354 QTC Calculation: 440 R Axis:   -19  Text Interpretation: Normal sinus rhythm Low voltage QRS Nonspecific ST abnormality No previous ECGs available Confirmed by Franchester, Rashon Rezek ([id number]) on 08/01/2023 2:45:08 PM   Echo 2022 1. Left ventricular ejection fraction, by estimation, is 60 to 65%. The  left ventricle has normal function. The left ventricle has no regional  wall motion abnormalities. Left ventricular diastolic parameters were  normal.   2. Right ventricular systolic function is normal. The right ventricular  size is normal.   3. The mitral valve is normal  in structure. No evidence of mitral valve  regurgitation. No evidence of mitral stenosis.   4. The aortic valve is normal in structure. Aortic valve regurgitation is  not visualized. No aortic stenosis is present.   5. The inferior vena cava is normal in size with greater than 50%  respiratory variability, suggesting right atrial pressure of 3 mmHg.   Comparison(s): LVEF 60-65.   Heart monitor 2020 Normal sinus rhythm with an average heart rate of 71 bpm. 34 episodes of short SVTs.  The longest lasted 15 beats. Rare PVCs.  Physical Exam:   VS:  BP 124/72 (BP Location: Left Arm, Patient Position: Sitting, Cuff Size: Normal)   Pulse (!) 108   Ht 5' 2 (1.575 m)   Wt 162 lb 9.6 oz (73.8 kg)   SpO2 98%   BMI 29.74 kg/m    Wt Readings from Last 3 Encounters:  08/01/23 162 lb 9.6 oz (73.8 kg)  07/10/23 160 lb (72.6 kg)  06/12/23 163 lb 12.8 oz (74.3 kg)    GEN: Well nourished, well developed in no acute distress NECK: No JVD; No carotid bruits CARDIAC: RRR, no murmurs, rubs, gallops RESPIRATORY:  Clear to auscultation without rales, wheezing or rhonchi  ABDOMEN: Soft, non-tender, non-distended EXTREMITIES:  No edema; No deformity   ASSESSMENT AND PLAN: .    pSVT Symptoms are well controlled with metoprolol . Refill metoprolol  75mg  BID.   H/o pericardial effusion No recurrent symptoms. Echo in 2022 showed normal LVEF with no effusion.   HTN BP is normal today, continue Lopressor  75mg  BID.  Multiple myeloma H/o chemotherapy in  the past, not off but expects to restart in the near furure. She sees oncology once monthly.   Dispo: Follow-up in 1 year  Signed, Daisie Haft VEAR Fishman, PA-C

## 2023-08-01 NOTE — Patient Instructions (Signed)
 Medication Instructions:   Your Physician recommend you continue on your current medication as directed.     *If you need a refill on your cardiac medications before your next appointment, please call your pharmacy*   Lab Work: None ordered.  If you have labs (blood work) drawn today and your tests are completely normal, you will receive your results only by: MyChart Message (if you have MyChart) OR A paper copy in the mail If you have any lab test that is abnormal or we need to change your treatment, we will call you to review the results.   Testing/Procedures: None ordered   Follow-Up: At [facility], you and your health needs are our priority.  As part of our continuing mission to provide you with exceptional heart care, we have created designated Provider Care Teams.  These Care Teams include your primary Cardiologist (physician) and Advanced Practice Providers (APPs -  Physician Assistants and Nurse Practitioners) who all work together to provide you with the care you need, when you need it.  We recommend signing up for the patient portal called MyChart.  Sign up information is provided on this After Visit Summary.  MyChart is used to connect with patients for Virtual Visits (Telemedicine).  Patients are able to view lab/test results, encounter notes, upcoming appointments, etc.  Non-urgent messages can be sent to your provider as well.   To learn more about what you can do with MyChart, go to [email or web address].    Your next appointment:   1 year(s)  Provider:   You may see Deatrice Cage, MD or one of the following Advanced Practice Providers on your designated Care Team:   Lonni Meager, NP Bernardino Bring, PA-C Cadence Franchester, PA-C Tylene Lunch, NP Barnie Hila, NP

## 2023-08-03 ENCOUNTER — Ambulatory Visit: Payer: Medicare HMO

## 2023-08-03 DIAGNOSIS — C9002 Multiple myeloma in relapse: Secondary | ICD-10-CM

## 2023-08-03 DIAGNOSIS — R5381 Other malaise: Secondary | ICD-10-CM

## 2023-08-03 DIAGNOSIS — T451X5A Adverse effect of antineoplastic and immunosuppressive drugs, initial encounter: Secondary | ICD-10-CM

## 2023-08-03 DIAGNOSIS — Z9481 Bone marrow transplant status: Secondary | ICD-10-CM

## 2023-08-03 NOTE — Therapy (Signed)
 OUTPATIENT PHYSICAL THERAPY ONCOLOGY TREATMENT  Patient Name: Jean Davidson MRN: [id number] DOB:09/13/1945, 78 y.o., female Today's Date: 08/03/2023  END OF SESSION:  PT End of Session - 08/03/23 1046     Visit Number 9    Number of Visits 12    Date for PT Re-Evaluation 08/10/23    PT Start Time 1050    PT Stop Time 1150    PT Time Calculation (min) 60 min    Activity Tolerance Patient tolerated treatment well    Behavior During Therapy [facility] for tasks assessed/performed             Past Medical History:  Diagnosis Date   Anemia    Anxiety    Blood transfusion without reported diagnosis    Carotid stenosis    Mild bilateral   Carpal tunnel syndrome    Cataract    Cervical disc disease    c7   Cough 07/27/2015   Disc degeneration    Dysuria 07/26/2015   Failure of stem cell transplant (HCC)    Fatigue 01/26/2015   Fever 07/27/2015   GERD (gastroesophageal reflux disease)    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   Multiple myeloma (HCC)    Osteopenia    Pinched nerve    Sinusitis, bacterial 07/27/2015   Spinal stenosis in cervical region    Varicose veins of lower extremities with inflammation    Past Surgical History:  Procedure Laterality Date   ANKLE SURGERY Left    BLADDER SUSPENSION  1988/1989   x2   COLONOSCOPY     ESOPHAGOGASTRODUODENOSCOPY     IR IMAGING GUIDED PORT INSERTION  03/01/2018   LIMBAL STEM CELL TRANSPLANT     x2   PORT-A-CATH REMOVAL     SHOULDER SURGERY Right 04/06/2013   right   UPPER GASTROINTESTINAL ENDOSCOPY     Patient Active Problem List   Diagnosis Date Noted   Closed left ankle fracture 06/16/2022   Fluid overload 04/17/2022   Anemia due to antineoplastic chemotherapy 06/24/2021   Sinusitis 06/13/2021    Neutropenic fever (HCC) 04/05/2021   Hyperglycemia, drug-induced 07/29/2020   Financial difficulties 05/27/2020   Bilateral cataracts 12/04/2019   Headache disorder 09/11/2019   Weight gain 09/11/2019   Skin lesion of back 08/07/2019   Pelvic floor dysfunction in female 07/09/2019   Alopecia 07/09/2019   Squamous cell cancer of skin of left temple 06/04/2019   Exertional shortness of breath 02/14/2019   Vitamin B12 deficiency 09/25/2018   Large hiatal hernia 07/26/2018   Belching 07/26/2018   Early satiety 07/26/2018   URI (upper respiratory infection) 06/25/2018   Lung nodule seen on imaging study 06/25/2018   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/11/2018   Tinnitus of both ears 04/10/2018   Preventive measure 04/10/2018   Goals of care, counseling/discussion 02/20/2018   Pain of left scapula 12/06/2017   Vitamin D  deficiency 11/12/2017   Yeast infection of the skin 09/06/2017   Family hx of colon cancer requiring screening colonoscopy 01/17/2017   Cystocele and rectocele with incomplete uterovaginal prolapse 11/22/2016   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   Bilateral pleural effusion 01/25/2016   Pericardial effusion 01/24/2016   Physical deconditioning 01/11/2016   S/P autologous bone marrow transplantation (HCC) 11/08/2015  Therapeutic drug monitoring 11/08/2015   Red blood cell antibody positive, compatible PRBC difficult to obtain 07/30/2015   Cough 07/27/2015   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   Fatigue 01/26/2015   Essential hypertension 01/26/2015   Other fatigue 01/26/2015   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/2015   Chronic recurrent sinusitis 10/23/2014   Conjunctivitis 10/23/2014   Status post allogeneic bone marrow transplant (HCC) 08/14/2014   Leukopenia due to antineoplastic chemotherapy (HCC) 08/10/2014   Patient in clinical research study 07/21/2014   Need for prophylactic vaccination and  inoculation against influenza 02/24/2014   Diarrhea 02/03/2014   Leukocytosis 02/03/2014   Constipation 11/21/2013   Rash 11/21/2013   Back pain 11/21/2013   Anxiety 11/21/2013   Multiple myeloma in relapse (HCC) 11/07/2013   Pancytopenia, acquired (HCC) 10/29/2013   Thrombocytopenia (HCC) 10/29/2013   Persistent cough for 3 weeks or longer 10/02/2013   Abnormal weight loss 10/02/2013   Normocytic anemia 08/22/2013   Abdominal bloating 08/22/2013   Hypersensitivity 08/07/2013   Other malaise and fatigue 08/07/2013   Osteopenia 08/05/2013   GERD (gastroesophageal reflux disease) 01/16/2013   Insomnia disorder 01/16/2013   Carotid stenosis    Cervical disc disease    Hypertension 04/15/2012   Palpitations 03/29/2012    PCP: Deatrice Cage, MD  REFERRING PROVIDER: Almarie Bedford, MD  REFERRING DIAG: Deconditioning  THERAPY DIAG:  Multiple myeloma in relapse (HCC)  History of bone marrow transplant (HCC)  Chemotherapy-induced neuropathy (HCC)  Physical deconditioning  ONSET DATE: 1 year ago  Rationale for Evaluation and Treatment: Rehabilitation  SUBJECTIVE:                                                                                                                                                                                           SUBJECTIVE STATEMENT: I was OK after last visit. I went to the cardiologist and they said my resting HR is high. I got my foam disc that I ordered.  EVAL I am so achy in my legs the longer I move or stand.. It improves if I move around. I don't feel sure footed due to neuropathy from chemo. I wobble when I walk.I want to do more. I want to be able to sit on the floor for my 57 year old granddaughter. Balance is a concern. I can get up and down from chairs but I do it slowly and I use my arms.  Right lateral thigh feels very tight and has been since my Left ankle fx. I have fallen and broken my wrist and ring finger  and have a right  reverse TSA.(2014). I don't meet my 5,000 step  goal. I Feel tight in both calves. I have trouble getting out of my car because of  leg (ITB) pain on right, and difficulty putting on my socks.  PERTINENT HISTORY:  Pt has a hx of Multiple Myeloma initially diagnosed in 2015. Underwent chemotherapy with several different ones due to side effects. 07/23/2014 underwent a Bone Marrow Transplant at [facility] She has had multiple bone marrow biopsies over the years and changes of chemotherapy. She received an allogenic stem cell transplant in April 2017. She is currently on high dose IVIG treatment.  Had a CAR-T in  [facility] April. She is very deconditioned. It is just in the marrow, and not in the bone. Fx left ankle last year and had ORIF in Nov. 23.  PAIN:  Are you having pain? NO,   PRECAUTIONS: Multiple Myeloma, macular degeneration, peripheral neuropathy, myelodisplastic syndrome,carotid stenosis(mild), Right reverse TSA, left ankle ORIF  WEIGHT BEARING RESTRICTIONS: No  FALLS:  Has patient fallen in last 6 months? No, but is at risk  LIVING ENVIRONMENT: Lives with: lives alone Lives in: House/apartment  OCCUPATION: retired engineer, site  LEISURE: arts and presenter, broadcasting,  HAND DOMINANCE: left   PRIOR LEVEL OF FUNCTION: Independent  PATIENT GOALS: Reduce achiness, improve confidence, improve balance,   OBJECTIVE: Note: Objective measures were completed at Evaluation unless otherwise noted.  COGNITION: Overall cognitive status: Within functional limits for tasks assessed   PALPATION: Very tender full length of bilateral ITB  OBSERVATIONS / OTHER ASSESSMENTS:    SENSATION: Light touch: deficits bilateral feet  POSTURE: forward head, rounded shoulders  UPPER EXTREMITY AROM/PROM:  A/PROM RIGHT   eval   Shoulder extension   Shoulder flexion   Shoulder abduction   Shoulder internal rotation   Shoulder external rotation     (Blank rows = not tested)  A/PROM LEFT   eval   Shoulder extension   Shoulder flexion   Shoulder abduction   Shoulder internal rotation   Shoulder external rotation     (Blank rows = not tested)  CERVICAL AROM: NT      UPPER EXTREMITY STRENGTH:    LOWER EXTREMITY AROM/PROM:  MMT Right eval  Hip flexion 4+  Hip extension Can bridge  Hip abduction 4+/5 sit  Hip adduction 4+/5 sit  Hip internal rotation 4  Hip external rotation 4+  Knee flexion 5  Knee extension 4  Ankle dorsiflexion 5  Ankle plantarflexion 5/5  Ankle inversion 4+  Ankle eversion 4+   (Blank rows = not tested)  MMT LEFT eval  Hip flexion 4  Hip extension Can bridge  Hip abduction 4+ sit  Hip adduction 4+ sit  Hip internal rotation 4  Hip external rotation 4+  Knee flexion 5  Knee extension 4  Ankle dorsiflexion 4+  Ankle plantarflexion 4  Ankle inversion 4  Ankle eversion 3+   (Blank rows = not tested)  LOWER EXTREMITY FLEXIBILITY; HS Right 48, Left 53, Very tight ITB and piriformis B, Bilateral gastroc  FUNCTIONAL TESTS:  30 sec sit to stand:  8 using hands at knees (below avg for age)  4 position balance test able to maintain 10 sec, some sway with tandem stance, left greater than right SLS  Able to turn full circle in both directions without LOB, able to stand with eyes closed 20 seconds feet slightly apart   TUG; 16 seconds, short steps, toe out, decreased heel strike.  GAIT: Distance walked: 40 Assistive device utilized: None Level of assistance: SBA Comments: short ,choppy steps,  decreased heel strike, toe out   LOWER EXTREMITY FUNCTIONAL SCALE; 22   TODAY'S TREATMENT:                                                                                                                                         DATE:   08/03/2023 Nu Step seat 6, UE 8, Lev 6  x 6 min to warm up, 397 steps Incline stretch at parallel bars x 3 x 20 sec Mini squats holding bar x5 emphasis on proper form then to high table without hand hold for  proper form and to strengthen legs for stairs and sit to stand Tandem stance bilateral x 2 on ax pad to failure Vector reaches x 5 ea on floor,  DLS on ax x 30 sec SLS on ax x 3 ea to failure Ambulated 331 ft with stop at stairs;up and down 4 stairs  x 4 with rail with emphasis on eccentric control and increased step length with walking. Sit to stand 2 x 5 no hands emphasis on eccentric control 5 ea on oval disc to failure, hand touch prn Ambulated 318 ft with emphasis on increased step length  07/31/2023 Nu Step seat 6, UE 8, Lev 6  x 5 min to warm up, 333 steps Incline stretch at parallel bars x 3 x 20 sec Marching no hands 2 x 10 CGA on ax Heel raises with  x 15 no HH on ax Tandem stance x 2 ea on ax x 30 sec, 1-2 quick hand touches 2nd trial for each Vector reaches x 5 B Sit to stand x 5 Ambulated to ortho gym, 4 steps with reciprocal gait x 4 up and down and back to cancer gym with longer step lengths and better arm swing. Postural theraband;scapular retraction red x 10, bilateral extension red x 10, Bilateral ER yellow x 5, single arm yellow x 5 Sit to stand x 5 arms crossed Ambulated ortho gym and back with emphasis again on increased step length and arm swing. Airex beam 6 lengths forward with intermittent hand touch and 6 lengths sideways intermittent hand touch 07/26/2023 Nu Step seat 6, UE 8, Lev 5  x 7 min to warm up, 518 steps Incline stretch at parallel bars x 3 x 20 sec Marching no hands 2 x 10 CGA  Heel raises with  x 15 no HH on ax Vector reaches x 5 ea B with toe touch, x 3 without toe touch Ax beam x 6 lengths forward and 6 lengths sideways cGA Ambulated to ortho gym and went up and down 4 steps with railing and reciprocal gait, and ambulated back with emphasis on increasing step length Sit to stand from chair 10 with emphasis on head over feet and control Postural theraband;scapular retraction red x 10, bilateral extension red x 10, Bilateral ER with red x 5, yellow   x 5  07/23/2023 Nu  Step seat 6, UE 8, Lev 5  x 6 min to warm up, 412 steps Incline stretch at parallel bars x 3 x 20 sec Marching no hands 2 x 10 CGA on ax Heel raises with HH x 10, x 10 no HH on ax Ax beam x 6 lengths forward and 6 lengths sideways cGA Step and hold on ax forward and sideways x 10 B with only occasional hand touch;CGA of PT Ambulated to ortho gym back wall and back, SBA with emphasis on heel strike/toe off, Stopped at stair case in ortho gym and using railing ambulated up and down 4 steps x 4 reps LT x 4 ea side Bilateral HS stretch with strap x 3 ea, 20 sec ea Rolling to bilateral ITB in SL 3 min ea 07/19/2023 Nu Step seat 6, UE 8, Lev 5  x 5 min to warm up Incline stretch at parallel bars x 3 x 20 sec Marching no hands 2 x 10 CGA on ax Heel raises with HH x 10, x 10 no HH on ax Step up on ax x 10 forward B, x 10 sideways B with CGA Ax beam x 6 lengths forward and 6 lengths sideways 6 in step ups with HH x 10 ea Ambulated to ortho gym back wall and back, SBA with emphasis on heel strike/toe off, 303 ft 1st walk, 313 second walk Sit to stand x 10 with some use of hands on legs   07/16/2023 Nu Step seat 6, UE 8, Lev 5  x 5 min to warm up Incline stretch at parallel bars x 3 x 20 sec Marching no hands 2 x 10 CGA on ax Heel raises with HH x 10, x 10 no HH on ax Step up on ax x 10 forward B, x 10 sideways B with CGA Ambulated to ortho gym back wall and back, CGA with emphasis on heel strike/toe off Ax beam x 4 lengths ea; forward and sideways with CGA of PT Bridging x10, ppt x 10 Ppt with march x 10 Ppt with ball squeeze between knees x 7 Bilateral ITB and hamstring stretches x 3 done by PT with 20 sec hold Updated HEP 07/09/2023 Nu Step seat 7, UE 8, Lev 5  x 5 minto warm up Incline stretch at parallel bars x 3 x 20 sec Marching no hands 2 x 10 CGA Heel raises with HH x 10, x 10 no HH Step up on ax x 10 forward B, x 10 sideways B with CGA Ambulated in cancer gym  with CGA emphasis on heel strike, toe off and lengthening stride slightly Bilateral piriformis stretch x 3 x 20 sec Bilateral ITB rolling in SL  07/04/2022 Nu Step seat 7, UE 8, Lev 5 to warm up Piriformis stretch x 3 B supine x 20 sec HS stretch with strap x 3 B 30 sec Gastroc stretch with strap in sitting x 3 Bilateral ITB rolling x 4 min ea in sidelying Updated HEP with pics 06/15/2023 Discussed POC, visit number, treatment interventions. Will include aquatic therapy but not to start and only if she wants to. Set timer for 6O min and get up to move around  PATIENT EDUCATION:  Access Code: [email or web address] URL: [email or web address] Date: 07/16/2023 Prepared by: Grayce Sheldon  Exercises - Supine Bridge  - 1 x daily - 7 x weekly - 1 sets - 10 reps - Supine Pelvic Tilt  - 1 x daily - 7 x weekly - 1 sets - 10 reps - Supine  Hip Adduction Isometric with Ball  - 1 x daily - 7 x weekly - 1 sets - 10 reps Access Code: [email or web address] URL: [email or web address] Date: 07/05/2023 Prepared by: Grayce Sheldon  Exercises - Supine Hamstring Stretch with Strap  - 1 x daily - 7 x weekly - 1 sets - 3 reps - 20-30 hold - Supine Hip External Rotation Stretch  - 1 x daily - 7 x weekly - 3 sets - 3 reps - 20-30 hold - Seated Calf Stretch with Strap  - 1 x daily - 7 x weekly - 1 sets - 3 reps - 20-30 hold - Supine ITB Stretch with Strap  - 1 x daily - 7 x weekly - 1 sets - 3 reps - 20 hold Education details: POC, visit number, treatment interventions. Will include aquatic therapy but not to start and only if she wants to Person educated: Patient Education method: Explanation Education comprehension: verbalized understanding  HOME EXERCISE PROGRAM: Set alarm for 60 min to get up and walk around house  ASSESSMENT: Treatment focused on balance, function, gait and strength with emphasis on controlling eccentric phase of stand to sit, and descending steps, as well as continued emphasis on  pt lengthening her step length to reduce short choppy steps. She has gotten much better with gait with attention to task but continues to require cueing.  Balance and confidence with balance is improving and pt was able to do more on the airex pad today.  CLINICAL IMPRESSION:  OBJECTIVE IMPAIRMENTS: Abnormal gait, decreased activity tolerance, decreased balance, decreased strength, impaired flexibility, impaired sensation, postural dysfunction, and pain.   ACTIVITY LIMITATIONS: carrying, bending, standing, squatting, stairs, and locomotion level  PARTICIPATION LIMITATIONS: cleaning, shopping, and community activity  PERSONAL FACTORS: Age and 1-2 comorbidities: Multiple Myeloma and current treatments  are also affecting patient's functional outcome.   REHAB POTENTIAL: Good  CLINICAL DECISION MAKING: Stable/uncomplicated  EVALUATION COMPLEXITY: Low  GOALS: Goals reviewed with patient? Yes  SHORT TERM GOALS: Target date: 07/12/2022  Pt will be independent with HEP to improve flexibility and strength Baseline: Goal status: INITIAL  2.  Pt will perform TUG in 13 seconds to decrease fall risk Baseline:  Goal status: INITIAL  3.  Pt will be able to perform  9 sit to stands in 30 seconds for average rating for her age Baseline: 8 Goal status: INITIAL  4.  Pt will have decreased right ITB pain by 25% Baseline:  Goal status: INITIAL  5.  Pt will ambulate with improved stride length and heel strike for more normal gait Baseline Goal status: MET 08/03/2023  LONG TERM GOALS: Target date: 08/09/2022  Pt will be able to don socks with improved ease Baseline:  Goal status: 08/03/2023 2.  Pt will have decreased right ITB pain by 50% or greater Baseline:  Goal status: INITIAL  3.  Pt will be able to get out of her car without right ITB pain Baseline:  Goal status: MET 07/15/2022 4.  LEFS will be increased to 40 to demonstrate improved function Baseline:  Goal status: INITIAL  5.  Pt  will perform TUG in 11 seconds  or less to demonstrate decreased fall risk Baseline:  Goal status: INITIAL  6.  Pt will perform SLS for 14 seconds on each side to achieve age related norms Baseline:  Goal status: INITIAL  PLAN:  PT FREQUENCY: 2x/week  PT DURATION: 8 weeks  PLANNED INTERVENTIONS: 97164- PT Re-evaluation, 97110-Therapeutic exercises, 97530- Therapeutic activity, [id number]- Neuromuscular re-education, 97535- Self Care,  [id number]- Manual therapy, [id number]- Gait training, and [id number]- Aquatic Therapy  PLAN FOR NEXT SESSION:  add UE strength, postural band,TB stretches;already gave pics, rolling to bilateral ITB, strength, balance; HEP; heel raises, sit to stand, stretches etc   Grayce JINNY Sheldon, PT 08/03/2023, 12:08 PM

## 2023-08-07 ENCOUNTER — Inpatient Hospital Stay: Payer: Medicare HMO

## 2023-08-07 ENCOUNTER — Inpatient Hospital Stay: Payer: Medicare HMO | Attending: Hematology and Oncology | Admitting: Hematology and Oncology

## 2023-08-07 ENCOUNTER — Encounter: Payer: Self-pay | Admitting: Hematology and Oncology

## 2023-08-07 VITALS — BP 128/66 | HR 75 | Temp 98.7°F | Resp 18 | Ht 62.0 in | Wt 160.2 lb

## 2023-08-07 VITALS — BP 153/73 | HR 72 | Temp 98.4°F | Resp 16

## 2023-08-07 DIAGNOSIS — T451X5A Adverse effect of antineoplastic and immunosuppressive drugs, initial encounter: Secondary | ICD-10-CM

## 2023-08-07 DIAGNOSIS — D469 Myelodysplastic syndrome, unspecified: Secondary | ICD-10-CM | POA: Insufficient documentation

## 2023-08-07 DIAGNOSIS — C946 Myelodysplastic disease, not classified: Secondary | ICD-10-CM

## 2023-08-07 DIAGNOSIS — D801 Nonfamilial hypogammaglobulinemia: Secondary | ICD-10-CM | POA: Diagnosis not present

## 2023-08-07 DIAGNOSIS — C9002 Multiple myeloma in relapse: Secondary | ICD-10-CM

## 2023-08-07 DIAGNOSIS — D61818 Other pancytopenia: Secondary | ICD-10-CM | POA: Diagnosis not present

## 2023-08-07 LAB — COMPREHENSIVE METABOLIC PANEL
ALT: 13 U/L (ref 0–44)
AST: 18 U/L (ref 15–41)
Albumin: 3.8 g/dL (ref 3.5–5.0)
Alkaline Phosphatase: 82 U/L (ref 38–126)
Anion gap: 7 (ref 5–15)
BUN: 18 mg/dL (ref 8–23)
CO2: 27 mmol/L (ref 22–32)
Calcium: 8.9 mg/dL (ref 8.9–10.3)
Chloride: 105 mmol/L (ref 98–111)
Creatinine, Ser: 0.8 mg/dL (ref 0.44–1.00)
GFR, Estimated: >60 mL/min (ref >60)
Glucose, Bld: 104 mg/dL — ABNORMAL HIGH (ref 70–99)
Potassium: 4 mmol/L (ref 3.5–5.1)
Sodium: 139 mmol/L (ref 135–145)
Total Bilirubin: 0.4 mg/dL (ref 0.0–1.2)
Total Protein: 7 g/dL (ref 6.5–8.1)

## 2023-08-07 LAB — CBC WITH DIFFERENTIAL/PLATELET
Abs Immature Granulocytes: 0.01 10*3/uL (ref 0.00–0.07)
Basophils Absolute: 0 10*3/uL (ref 0.0–0.1)
Basophils Relative: 0 %
Eosinophils Absolute: 0 10*3/uL (ref 0.0–0.5)
Eosinophils Relative: 0 %
HCT: 33.2 % — ABNORMAL LOW (ref 36.0–46.0)
Hemoglobin: 10.8 g/dL — ABNORMAL LOW (ref 12.0–15.0)
Immature Granulocytes: 0 %
Lymphocytes Relative: 19 %
Lymphs Abs: 1 10*3/uL (ref 0.7–4.0)
MCH: 35.4 pg — ABNORMAL HIGH (ref 26.0–34.0)
MCHC: 32.5 g/dL (ref 30.0–36.0)
MCV: 108.9 fL — ABNORMAL HIGH (ref 80.0–100.0)
Monocytes Absolute: 0.5 10*3/uL (ref 0.1–1.0)
Monocytes Relative: 9 %
Neutro Abs: 3.7 10*3/uL (ref 1.7–7.7)
Neutrophils Relative %: 72 %
Platelets: 116 10*3/uL — ABNORMAL LOW (ref 150–400)
RBC: 3.05 MIL/uL — ABNORMAL LOW (ref 3.87–5.11)
RDW: 14.8 % (ref 11.5–15.5)
WBC: 5.2 10*3/uL (ref 4.0–10.5)
nRBC: 0 % (ref 0.0–0.2)

## 2023-08-07 MED ORDER — FAMOTIDINE IN NACL 20-0.9 MG/50ML-% IV SOLN
20.0000 mg | Freq: Once | INTRAVENOUS | Status: AC
  Administered 2023-08-07: 20 mg via INTRAVENOUS
  Filled 2023-08-07: qty 50

## 2023-08-07 MED ORDER — DEXTROSE 5 % IV SOLN: INTRAVENOUS | Status: DC

## 2023-08-07 MED ORDER — ACYCLOVIR 400 MG PO TABS: 400.0000 mg | ORAL_TABLET | Freq: Two times a day (BID) | ORAL | 1 refills | Status: AC

## 2023-08-07 MED ORDER — ACETAMINOPHEN 325 MG PO TABS
650.0000 mg | ORAL_TABLET | Freq: Once | ORAL | Status: AC
  Administered 2023-08-07: 650 mg via ORAL
  Filled 2023-08-07: qty 2

## 2023-08-07 MED ORDER — METHYLPREDNISOLONE SODIUM SUCC 40 MG IJ SOLR
40.0000 mg | Freq: Once | INTRAMUSCULAR | Status: AC
  Administered 2023-08-07: 40 mg via INTRAVENOUS
  Filled 2023-08-07: qty 1

## 2023-08-07 MED ORDER — IMMUNE GLOBULIN (HUMAN) 10 GM/100ML IV SOLN
0.9500 g/kg | Freq: Once | INTRAVENOUS | Status: AC
Start: 2023-08-07 — End: 2023-08-07
  Administered 2023-08-07: 70 g via INTRAVENOUS
  Filled 2023-08-07: qty 700

## 2023-08-07 NOTE — Progress Notes (Signed)
Pt observed for 15 minutes post IVIG infusion. Pt tolerated Tx well w/out incident. VSS at discharge.  Ambulatory to lobby.

## 2023-08-07 NOTE — Assessment & Plan Note (Signed)
She has acquired hypogammaglobulinemia  She will continue IVIG monthly

## 2023-08-07 NOTE — Patient Instructions (Signed)
Immune Globulin Injection What is this medication? IMMUNE GLOBULIN (im MUNE GLOB yoo lin) treats many immune system conditions. It works by Designer, multimedia extra antibodies. Antibodies are proteins made by the immune system that help protect the body. This medicine may be used for other purposes; ask your health care provider or pharmacist if you have questions. COMMON BRAND NAME(S): ASCENIV, Baygam, BIVIGAM, Carimune, Carimune NF, cutaquig, Cuvitru, Flebogamma, Flebogamma DIF, GamaSTAN, GamaSTAN S/D, Gamimune N, Gammagard, Gammagard S/D, Gammaked, Gammaplex, Gammar-P IV, Gamunex, Gamunex-C, Hizentra, Iveegam, Iveegam EN, Octagam, Panglobulin, Panglobulin NF, panzyga, Polygam S/D, Privigen, Sandoglobulin, Venoglobulin-S, Vigam, Vivaglobulin, Xembify What should I tell my care team before I take this medication? They need to know if you have any of these conditions: Blood clotting disorder Condition where you have excess fluid in your body, such as heart failure or edema Dehydration Diabetes Have had blood clots Heart disease Immune system conditions Kidney disease Low levels of IgA Recent or upcoming vaccine An unusual or allergic reaction to immune globulin, other medications, foods, dyes, or preservatives Pregnant or trying to get pregnant Breastfeeding How should I use this medication? This medication is infused into a vein or under the skin. It is usually given by your care team in a hospital or clinic setting. It may also be given at home. If you get this medication at home, you will be taught how to prepare and give it. Use exactly as directed. Take it as directed on the prescription label at the same time every day. Keep taking it unless your care team tells you to stop. It is important that you put your used needles and syringes in a special sharps container. Do not put them in a trash can. If you do not have a sharps container, call your pharmacist or care team to get one. Talk to  your care team about the use of this medication in children. While it may be given to children for selected conditions, precautions do apply. Overdosage: If you think you have taken too much of this medicine contact a poison control center or emergency room at once. NOTE: This medicine is only for you. Do not share this medicine with others. What if I miss a dose? If you get this medication at the hospital or clinic: It is important not to miss your dose. Call your care team if you are unable to keep an appointment. If you give yourself this medication at home: If you miss a dose, take it as soon as you can. Then continue your normal schedule. If it is almost time for your next dose, take only that dose. Do not take double or extra doses. Call your care team with questions. What may interact with this medication? Live virus vaccines This list may not describe all possible interactions. Give your health care provider a list of all the medicines, herbs, non-prescription drugs, or dietary supplements you use. Also tell them if you smoke, drink alcohol, or use illegal drugs. Some items may interact with your medicine. What should I watch for while using this medication? Your condition will be monitored carefully while you are receiving this medication. Tell your care team if your symptoms do not start to get better or if they get worse. You may need blood work done while you are taking this medication. This medication increases the risk of blood clots. People with heart, blood vessel, or blood clotting conditions are more likely to develop a blood clot. Other risk factors include advanced age, estrogen  use, tobacco use, lack of movement, and being overweight. This medication can decrease the response to a vaccine. If you need to get vaccinated, tell your care team if you have received this medication within the last year. Extra booster doses may be needed. Talk to your care team to see if a different  vaccination schedule is needed. If you have diabetes, you may get a falsely elevated blood sugar reading. Talk to your care team about how to check your blood sugar while taking this medication. What side effects may I notice from receiving this medication? Side effects that you should report to your care team as soon as possible: Allergic reactions--skin rash, itching, hives, swelling of the face, lips, tongue, or throat Blood clot--pain, swelling, or warmth in the leg, shortness of breath, chest pain Fever, neck pain or stiffness, sensitivity to light, headache, nausea, vomiting, confusion, which may be signs of meningitis Hemolytic anemia--unusual weakness or fatigue, dizziness, headache, trouble breathing, dark urine, yellowing skin or eyes Kidney injury--decrease in the amount of urine, swelling of the ankles, hands, or feet Low sodium level--muscle weakness, fatigue, dizziness, headache, confusion Shortness of breath or trouble breathing, cough, unusual weakness or fatigue, blue skin or lips Side effects that usually do not require medical attention (report these to your care team if they continue or are bothersome): Chills Diarrhea Fever Headache Nausea This list may not describe all possible side effects. Call your doctor for medical advice about side effects. You may report side effects to FDA at 1-800-FDA-1088. Where should I keep my medication? Keep out of the reach of children and pets. You will be instructed on how to store this medication. Get rid of any unused medication after the expiration date. To get rid of medications that are no longer needed or have expired: Take the medication to a medication take-back program. Check with your pharmacy or law enforcement to find a location. If you cannot return the medication, ask your pharmacist or care team how to get rid of this medication safely. NOTE: This sheet is a summary. It may not cover all possible information. If you have  questions about this medicine, talk to your doctor, pharmacist, or health care provider.  2024 Elsevier/Gold Standard (2023-05-25 00:00:00)

## 2023-08-07 NOTE — Assessment & Plan Note (Signed)
She has persistent pancytopenia but not symptomatic She does not need G-CSF support I will see her again next month for further follow-up

## 2023-08-07 NOTE — Progress Notes (Signed)
[facility] OFFICE PROGRESS NOTE  Patient Care Team: Patient, [name] as PCP - General (General Practice) Iran Ouch, MD as PCP - Cardiology (Cardiology) Astrid Drafts, MD as Referring Physician (Hematology and Oncology) Eddie Candle, MD as Consulting Physician (Internal Medicine) Daiva Eves, Lisette Grinder, MD as Consulting Physician (Infectious Diseases) Lorriane Shire An, MD as Consulting Physician (Hematology and Oncology) Sadie Haber, NP as Nurse Practitioner (Hematology and Oncology) Artis Delay, MD as Consulting Physician (Hematology and Oncology)  ASSESSMENT & PLAN:  Multiple myeloma in relapse 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   All questions were answered. The patient knows to call the clinic with any problems, questions or concerns. The total time spent in the appointment was 20 minutes encounter with patients including review of chart and various tests results, discussions about plan of care and coordination of care plan   Artis Delay, MD 08/07/2023 9:47 AM  INTERVAL HISTORY: Please see below for problem oriented charting. she returns for treatment follow-up She tolerated IVIG well She was seen by cardiologist recently She is undergoing physical therapy and rehab We discussed recent myeloma panel and future plan of care  REVIEW OF SYSTEMS:   Constitutional: Denies fevers, chills or abnormal weight loss Eyes: Denies blurriness of vision Ears, nose, mouth,  throat, and face: Denies mucositis or sore throat Respiratory: Denies cough, dyspnea or wheezes Cardiovascular: Denies palpitation, chest discomfort or lower extremity swelling Gastrointestinal:  Denies nausea, heartburn or change in bowel habits Skin: Denies abnormal skin rashes Lymphatics: Denies new lymphadenopathy or easy bruising Neurological:Denies numbness, tingling or new weaknesses Behavioral/Psych: Mood is stable, no new changes  All other systems were reviewed with the patient and are negative.  I have reviewed the past medical history, past surgical history, social history and family history with the patient and they are unchanged from previous note.  ALLERGIES:  has no known allergies.  MEDICATIONS:  Current Outpatient Medications  Medication Sig Dispense Refill   acyclovir (ZOVIRAX) 400 MG tablet Take 1 tablet (400 mg total) by mouth 2 (two) times daily. 180 tablet 1   aspirin EC 81 MG tablet Take 81 mg by mouth daily. Swallow whole.     atovaquone (MEPRON) 750 MG/5ML suspension Take 1,500 mg by mouth 2 (two) times daily.     cholecalciferol (VITAMIN D3) 25 MCG (1000 UNIT) tablet Take 4,000 Units by mouth daily. Instructed to increase to 4,000 units daily     metoprolol tartrate (LOPRESSOR) 25 MG tablet TAKE 1 & 1/2 TABLET BY MOUTH TWICE A DAY--due for follow up appointment. 270 tablet 3   mirtazapine (REMERON) 15 MG tablet TAKE 1 TABLET BY MOUTH EVERYDAY AT BEDTIME 90 tablet 1   Multiple Vitamins-Minerals (PRESERVISION AREDS 2 PO) Take 1 tablet by mouth 2 (two) times daily.     ondansetron (ZOFRAN) 8 MG tablet Take 1 tablet (8 mg total) by mouth every 8 (eight) hours as needed (Nausea or vomiting). (Patient taking differently: Take 8 mg by mouth 2 (two) times daily.) 60 tablet 1   pantoprazole (PROTONIX) 40 MG tablet Take 1 tablet (40  mg total) by mouth daily.     prochlorperazine (COMPAZINE) 5 MG tablet Take 5 mg by mouth every 6 (six) hours as needed for nausea or vomiting.      senna-docusate (SENOKOT-S) 8.6-50 MG tablet Take 2 tablets by mouth daily as needed.     No current facility-administered medications for this visit.   Facility-Administered Medications Ordered in Other Visits  Medication Dose Route Frequency Provider Last Rate Last Admin   dextrose 5 % solution   Intravenous Continuous Artis Delay, MD 10 mL/hr at 08/07/23 0929 New Bag at 08/07/23 0929   famotidine (PEPCID) IVPB 20 mg premix  20 mg Intravenous Once Bertis Ruddy, Kendrah Lovern, MD 200 mL/hr at 08/07/23 0935 20 mg at 08/07/23 0935   Immune Globulin 10% (PRIVIGEN) IV infusion 70 g  0.95 g/kg Intravenous Once Artis Delay, MD        SUMMARY OF ONCOLOGIC HISTORY: Oncology History Overview Note  Multiple myeloma, Ig A Lambda, M spike 3.54 grams, Calcium 9.2, Creatinine 0.8, Beta 2 microglobulin 4.52, IgA 4840 mg/dL, lambda light chain 16.1, albumin 3.6, hemoglobin 9.7, platelet 115    Primary site: Multiple Myeloma   Staging method: AJCC 6th Edition   Clinical: Stage IIA signed by Artis Delay, MD on 11/07/2013  2:46 PM   Summary: Stage IIA S/p Allo transplant at [facility] on revlimid, pomalyst, daratumumab and velcade     Multiple myeloma in relapse (HCC)  10/31/2013 Bone Marrow Biopsy   Bone marrow biopsy confirmed multiple myeloma with 40% bone marrow involvement. Skeletal survey showed minimal lesions in her score with generalized demineralization   11/10/2013 - 02/13/2014 Chemotherapy   The patient is started on induction chemotherapy with weekly dexamethasone 40 mg by mouth as well as Velcade subcutaneous injection on days 1, 4, 8 and 11. On 11/21/2013, she was started on monthly Zometa.   12/23/2013 Adverse Reaction   The dose of Velcade was reduced due to thrombocytopenia.   01/28/2014 - 04/07/2014 Chemotherapy   Revlimid is added. Treatment was discontinued due to lack of response.   02/24/2014 - 04/07/2014 Chemotherapy   Due to worsening peripheral neuropathy, Velcade injection is changed to  once a week. Revlimid was given 21 days on, 7 days off.   04/07/2014 - 04/10/2014 Chemotherapy   Revlimid was discontinued due to lack of response. Chemotherapy was changed back to Velcade injection twice a week, 2 weeks on 1 week off. Her treatment was switched to to minimum response   04/20/2014 - 06/02/2014 Chemotherapy   chemotherapy is switched to Carfilzomib, Cytoxan and dexamethasone.   04/22/2014 Procedure   she has placement of port for chemotherapy.   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 at [facility]   10/20/2014 - 03/24/2015 Chemotherapy    she received chemotherapy with Kyprolis, Revlimid and dexamethasone   10/22/2014 Procedure   She has port placement   01/19/2015 Tumor Marker   IgA lambda M spike at 0.4 g    01/20/2015 Miscellaneous   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 No cource was found and fever resolved with IV vancomycin and meropenem   09/13/2015 Bone Marrow Biopsy   Bone marrow biopsy showed no increased blasts, 3-4 % plasma cells   03/02/2016 Bone Marrow Biopsy   Bone marrow biopsy at [facility] showed normocellular (30%) bone marrow  with trilineage hematopoiesis. No significant increase in blasts. No significant increase in plasma cells.   05/12/2016 Imaging   DEXA scan at [facility] showed osteopenia   10/24/2016 Imaging   Skeletal survey at [facility], no new lesions   12/07/2017 Imaging   No focal abnormality noted to suggest myeloma. Exam is stable from prior exam.   03/01/2018 Procedure   Successful 8 French right internal jugular vein power port placement with its tip at the SVC/RA junction.   03/06/2018 -  Chemotherapy   The patient had daratumumab    07/09/2018 Bone Marrow Biopsy   Bone marrow biopsy at [facility] showed residual disease at 0.004% plasma cells   07/03/2019 Bone Marrow Biopsy   A. Bone marrow, flow cytometric analysis for multiple myeloma minimal residual disease detection:   Negative. No phenotypically abnormal plasma cells at or above the limit of detection identified.     No monotypic B-cell population identified. Negative for increased blasts.   03/25/2021 Bone Marrow Biopsy   Repeat bone marrow biopsy at [facility] showed female donor karyotype.  5 to 7% plasma cell is seen.  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   04/20/2021 Echocardiogram   1. Left ventricular ejection fraction, by estimation, is 60 to 65%. The left ventricle has normal function. The left ventricle has no regional wall motion abnormalities. Left ventricular diastolic parameters were normal.  2. Right ventricular systolic function is normal. The right ventricular size is normal.  3. The mitral valve is normal in structure. No evidence of mitral valve regurgitation. No evidence of mitral stenosis.  4. The aortic valve is normal in structure. Aortic valve regurgitation is not visualized. No aortic stenosis is present.  5. The inferior vena cava is normal in size with greater than 50%  respiratory variability, suggesting right atrial pressure of 3 mmHg.   04/29/2021 - 02/17/2022 Chemotherapy   Patient is on Treatment Plan : MYELOMA RELAPSED/ REFRACTORY Carfilzomib D1,8,15 (20/27) + Dexamethasone (KPd) q28d     04/30/2021 - 09/28/2022 Chemotherapy   Patient is on Treatment Plan : MYELOMA RELAPSED/REFRACTORY Carfilzomib (20/70) D1,8,15 + Dexamethasone weekly (40) (Kd) q28d  x 9 cycles / Dexamethasone D1,8,15     05/04/2021 PET scan   1. No evidence active multiple myeloma within the skeleton on FDG PET scan. 2. No suspicious lytic lesion on CT portion exam. 3. No plasmacytoma.   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HCC)  04/06/2015 Bone Marrow Biopsy   Accession: [id number] BM biopsy showed RAEB-1   04/06/2015 Tumor Marker   Cytogenetics and FISH for MDS are within normal limits   10/06/2015 - 10/10/2015 Chemotherapy   She received conditioning chemotherapy with busulfan and melphalan   10/12/2015 Bone  Marrow Transplant   She received allogenic stem cell transplant   10/19/2015 Adverse Reaction   She developed posttransplant complication with mucositis, viral infection with rhinovirus, neutropenic fever, bilateral pleural effusion and moderate pericardial effusion and CMV reactivation.   10/31/2015 Miscellaneous   She has engrafted     PHYSICAL EXAMINATION: ECOG PERFORMANCE STATUS: 1 - Symptomatic but completely ambulatory  Vitals:   08/07/23 0847  BP: 128/66  Pulse: 75  Resp: 18  Temp: 98.7 F (37.1 C)  SpO2: 100%   Filed Weights   08/07/23 0847  Weight: 160 lb 3.2 oz (72.7 kg)    GENERAL:alert, no distress and comfortable LABORATORY DATA:  I have reviewed the data as listed    Component Value Date/Time   NA 139 08/07/2023 0814   NA 142 01/04/2017 0902   K 4.0 08/07/2023 0814   K 3.9 01/04/2017 0902   CL 105 08/07/2023 0814   CO2 27 08/07/2023 0814   CO2 28 01/04/2017 0902   GLUCOSE 104 (H) 08/07/2023 0814   GLUCOSE 92 01/04/2017  0902   BUN 18 08/07/2023 0814   BUN 9.5 01/04/2017 0902   CREATININE 0.80 08/07/2023 0814   CREATININE 0.76 09/26/2022 1256   CREATININE 0.7 01/04/2017 0902   CALCIUM 8.9 08/07/2023 0814   CALCIUM 9.0 01/04/2017 0902   PROT 7.0 08/07/2023 0814   PROT 6.0 (L) 01/04/2017 0902   PROT 5.8 (L) 01/04/2017 0902   ALBUMIN 3.8 08/07/2023 0814   ALBUMIN 3.4 (L) 01/04/2017 0902   AST 18 08/07/2023 0814   AST 9 (L) 09/26/2022 1256   AST 21 01/04/2017 0902   ALT 13 08/07/2023 0814   ALT 6 09/26/2022 1256   ALT 16 01/04/2017 0902   ALKPHOS 82 08/07/2023 0814   ALKPHOS 101 01/04/2017 0902   BILITOT 0.4 08/07/2023 0814   BILITOT 0.3 09/26/2022 1256   BILITOT 0.56 01/04/2017 0902   GFRNONAA >60 08/07/2023 0814   GFRNONAA >60 09/26/2022 1256   GFRAA >60 03/25/2020 0917   GFRAA >60 12/18/2018 0901    No results found for: "SPEP", "UPEP"  Lab Results  Component Value Date   WBC 5.2 08/07/2023   NEUTROABS 3.7 08/07/2023   HGB 10.8 (L) 08/07/2023   HCT 33.2 (L) 08/07/2023   MCV 108.9 (H) 08/07/2023   PLT 116 (L) 08/07/2023      Chemistry      Component Value Date/Time   NA 139 08/07/2023 0814   NA 142 01/04/2017 0902   K 4.0 08/07/2023 0814   K 3.9 01/04/2017 0902   CL 105 08/07/2023 0814   CO2 27 08/07/2023 0814   CO2 28 01/04/2017 0902   BUN 18 08/07/2023 0814   BUN 9.5 01/04/2017 0902   CREATININE 0.80 08/07/2023 0814   CREATININE 0.76 09/26/2022 1256   CREATININE 0.7 01/04/2017 0902      Component Value Date/Time   CALCIUM 8.9 08/07/2023 0814   CALCIUM 9.0 01/04/2017 0902   ALKPHOS 82 08/07/2023 0814   ALKPHOS 101 01/04/2017 0902   AST 18 08/07/2023 0814   AST 9 (L) 09/26/2022 1256   AST 21 01/04/2017 0902   ALT 13 08/07/2023 0814   ALT 6 09/26/2022 1256   ALT 16 01/04/2017 0902   BILITOT 0.4 08/07/2023 0814   BILITOT 0.3 09/26/2022 1256   BILITOT 0.56 01/04/2017 0902

## 2023-08-07 NOTE — Assessment & Plan Note (Signed)
Recent myeloma panel show remission although immunofixation revealed persistent IgA lambda Continue to monitor once a month

## 2023-08-08 LAB — KAPPA/LAMBDA LIGHT CHAINS
Kappa free light chain: 13.4 mg/L (ref 3.3–19.4)
Kappa, lambda light chain ratio: 0.2 — ABNORMAL LOW (ref 0.26–1.65)
Lambda free light chains: 66.4 mg/L — ABNORMAL HIGH (ref 5.7–26.3)

## 2023-08-10 ENCOUNTER — Ambulatory Visit: Payer: Medicare HMO

## 2023-08-10 DIAGNOSIS — C9002 Multiple myeloma in relapse: Secondary | ICD-10-CM | POA: Diagnosis not present

## 2023-08-10 DIAGNOSIS — G620 Drug-induced polyneuropathy: Secondary | ICD-10-CM

## 2023-08-10 DIAGNOSIS — Z9481 Bone marrow transplant status: Secondary | ICD-10-CM

## 2023-08-10 DIAGNOSIS — R5381 Other malaise: Secondary | ICD-10-CM

## 2023-08-10 DIAGNOSIS — R2681 Unsteadiness on feet: Secondary | ICD-10-CM

## 2023-08-10 LAB — MULTIPLE MYELOMA PANEL, SERUM
Albumin SerPl Elph-Mcnc: 3.3 g/dL (ref 2.9–4.4)
Albumin/Glob SerPl: 1.1 (ref 0.7–1.7)
Alpha 1: 0.3 g/dL (ref 0.0–0.4)
Alpha2 Glob SerPl Elph-Mcnc: 0.8 g/dL (ref 0.4–1.0)
B-Globulin SerPl Elph-Mcnc: 0.9 g/dL (ref 0.7–1.3)
Gamma Glob SerPl Elph-Mcnc: 1.2 g/dL (ref 0.4–1.8)
Globulin, Total: 3.1 g/dL (ref 2.2–3.9)
IgA: 172 mg/dL (ref 64–422)
IgG (Immunoglobin G), Serum: 1137 mg/dL (ref 586–1602)
IgM (Immunoglobulin M), Srm: 42 mg/dL (ref 26–217)
M Protein SerPl Elph-Mcnc: 0.3 g/dL — ABNORMAL HIGH
Total Protein ELP: 6.4 g/dL (ref 6.0–8.5)

## 2023-08-10 NOTE — Therapy (Signed)
OUTPATIENT PHYSICAL THERAPY ONCOLOGY TREATMENT  Patient Name: Jean Davidson MRN: [id number] DOB:March 14, 1946, 78 y.o., female Today's Date: 08/10/2023  END OF SESSION:  PT End of Session - 08/10/23 1056     Visit Number 10    Number of Visits 17    Date for PT Re-Evaluation 09/21/23    PT Start Time 1104    PT Stop Time 1150    PT Time Calculation (min) 46 min    Activity Tolerance Patient tolerated treatment well    Behavior During Therapy [facility] for tasks assessed/performed             Past Medical History:  Diagnosis Date   Anemia    Anxiety    Blood transfusion without reported diagnosis    Carotid stenosis    Mild bilateral   Carpal tunnel syndrome    Cataract    Cervical disc disease    c7   Cough 07/27/2015   Disc degeneration    Dysuria 07/26/2015   Failure of stem cell transplant (HCC)    Fatigue 01/26/2015   Fever 07/27/2015   GERD (gastroesophageal reflux disease)    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   Multiple myeloma (HCC)    Osteopenia    Pinched nerve    Sinusitis, bacterial 07/27/2015   Spinal stenosis in cervical region    Varicose veins of lower extremities with inflammation    Past Surgical History:  Procedure Laterality Date   ANKLE SURGERY Left    BLADDER SUSPENSION  1988/1989   x2   COLONOSCOPY     ESOPHAGOGASTRODUODENOSCOPY     IR IMAGING GUIDED PORT INSERTION  03/01/2018   LIMBAL STEM CELL TRANSPLANT     x2   PORT-A-CATH REMOVAL     SHOULDER SURGERY Right 04/06/2013   right   UPPER GASTROINTESTINAL ENDOSCOPY     Patient Active Problem List   Diagnosis Date Noted   Closed left ankle fracture 06/16/2022   Fluid overload 04/17/2022   Anemia due to antineoplastic chemotherapy 06/24/2021   Sinusitis 06/13/2021    Neutropenic fever (HCC) 04/05/2021   Hyperglycemia, drug-induced 07/29/2020   Financial difficulties 05/27/2020   Bilateral cataracts 12/04/2019   Headache disorder 09/11/2019   Weight gain 09/11/2019   Skin lesion of back 08/07/2019   Pelvic floor dysfunction in female 07/09/2019   Alopecia 07/09/2019   Squamous cell cancer of skin of left temple 06/04/2019   Exertional shortness of breath 02/14/2019   Vitamin B12 deficiency 09/25/2018   Large hiatal hernia 07/26/2018   Belching 07/26/2018   Early satiety 07/26/2018   URI (upper respiratory infection) 06/25/2018   Lung nodule seen on imaging study 06/25/2018   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/11/2018   Tinnitus of both ears 04/10/2018   Preventive measure 04/10/2018   Goals of care, counseling/discussion 02/20/2018   Pain of left scapula 12/06/2017   Vitamin D deficiency 11/12/2017   Yeast infection of the skin 09/06/2017   Family hx of colon cancer requiring screening colonoscopy 01/17/2017   Cystocele and rectocele with incomplete uterovaginal prolapse 11/22/2016   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   Bilateral pleural effusion 01/25/2016   Pericardial effusion 01/24/2016   Physical deconditioning 01/11/2016   S/P autologous bone marrow transplantation (HCC) 11/08/2015  Therapeutic drug monitoring 11/08/2015   Red blood cell antibody positive, compatible PRBC difficult to obtain 07/30/2015   Cough 07/27/2015   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   Fatigue 01/26/2015   Essential hypertension 01/26/2015   Other fatigue 01/26/2015   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/2015   Chronic recurrent sinusitis 10/23/2014   Conjunctivitis 10/23/2014   Status post allogeneic bone marrow transplant (HCC) 08/14/2014   Leukopenia due to antineoplastic chemotherapy (HCC) 08/10/2014   Patient in clinical research study 07/21/2014   Need for prophylactic vaccination and  inoculation against influenza 02/24/2014   Diarrhea 02/03/2014   Leukocytosis 02/03/2014   Constipation 11/21/2013   Rash 11/21/2013   Back pain 11/21/2013   Anxiety 11/21/2013   Multiple myeloma in relapse (HCC) 11/07/2013   Pancytopenia, acquired (HCC) 10/29/2013   Thrombocytopenia (HCC) 10/29/2013   Persistent cough for 3 weeks or longer 10/02/2013   Abnormal weight loss 10/02/2013   Normocytic anemia 08/22/2013   Abdominal bloating 08/22/2013   Hypersensitivity 08/07/2013   Other malaise and fatigue 08/07/2013   Osteopenia 08/05/2013   GERD (gastroesophageal reflux disease) 01/16/2013   Insomnia disorder 01/16/2013   Carotid stenosis    Cervical disc disease    Hypertension 04/15/2012   Palpitations 03/29/2012    PCP: Lorine Bears, MD  REFERRING PROVIDER: Artis Delay, MD  REFERRING DIAG: Deconditioning  THERAPY DIAG:  Multiple myeloma in relapse (HCC)  History of bone marrow transplant (HCC)  Chemotherapy-induced neuropathy (HCC)  Physical deconditioning  Gait instability  ONSET DATE: 1 year ago  Rationale for Evaluation and Treatment: Rehabilitation  SUBJECTIVE:                                                                                                                                                                                           SUBJECTIVE STATEMENT:  I have to drive to [facility] today for lab work. 1 of my numbers has tripled. I do my exercises but I am not consistent due to fatigue  EVAL I am so achy in my legs the longer I move or stand.. It improves if I move around. I don't feel sure footed due to neuropathy from chemo. I wobble when I walk.I want to do more. I want to be able to sit on the floor for my 59 year old granddaughter. Balance is a concern. I can get up and down from chairs but I do it slowly and I use my arms.  Right lateral thigh feels very tight and has been since my Left ankle fx. I have fallen and broken my wrist and ring finger   and have a right reverse TSA.(2014). I  don't meet my 5,000 step goal. I Feel tight in both calves. I have trouble getting out of my car because of  leg (ITB) pain on right, and difficulty putting on my socks.  PERTINENT HISTORY:  Pt has a hx of Multiple Myeloma initially diagnosed in 2015. Underwent chemotherapy with several different ones due to side effects. 07/23/2014 underwent a Bone Marrow Transplant at [facility] She has had multiple bone marrow biopsies over the years and changes of chemotherapy. She received an allogenic stem cell transplant in April 2017. She is currently on high dose IVIG treatment.  Had a [facility]-T in  [facility] April. She is very deconditioned. It is just in the marrow, and not in the bone. Fx left ankle last year and had ORIF in Nov. 23.  PAIN:  Are you having pain? NO,   PRECAUTIONS: Multiple Myeloma, macular degeneration, peripheral neuropathy, myelodisplastic syndrome,carotid stenosis(mild), Right reverse TSA, left ankle ORIF  WEIGHT BEARING RESTRICTIONS: No  FALLS:  Has patient fallen in last 6 months? No, but is at risk  LIVING ENVIRONMENT: Lives with: lives alone Lives in: House/apartment  OCCUPATION: retired Engineer, site  LEISURE: arts and Presenter, broadcasting,  HAND DOMINANCE: left   PRIOR LEVEL OF FUNCTION: Independent  PATIENT GOALS: Reduce achiness, improve confidence, improve balance,   OBJECTIVE: Note: Objective measures were completed at Evaluation unless otherwise noted.  COGNITION: Overall cognitive status: Within functional limits for tasks assessed   PALPATION: Very tender full length of bilateral ITB  OBSERVATIONS / OTHER ASSESSMENTS:    SENSATION: Light touch: deficits bilateral feet  POSTURE: forward head, rounded shoulders  UPPER EXTREMITY AROM/PROM:  A/PROM RIGHT   eval   Shoulder extension   Shoulder flexion   Shoulder abduction   Shoulder internal rotation   Shoulder external rotation     (Blank rows = not tested)  A/PROM  LEFT   eval  Shoulder extension   Shoulder flexion   Shoulder abduction   Shoulder internal rotation   Shoulder external rotation     (Blank rows = not tested)  CERVICAL AROM: NT      UPPER EXTREMITY STRENGTH:    LOWER EXTREMITY AROM/PROM:  MMT Right eval  Hip flexion 4+  Hip extension Can bridge  Hip abduction 4+/5 sit  Hip adduction 4+/5 sit  Hip internal rotation 4  Hip external rotation 4+  Knee flexion 5  Knee extension 4  Ankle dorsiflexion 5  Ankle plantarflexion 5/5  Ankle inversion 4+  Ankle eversion 4+   (Blank rows = not tested)  MMT LEFT eval  Hip flexion 4  Hip extension Can bridge  Hip abduction 4+ sit  Hip adduction 4+ sit  Hip internal rotation 4  Hip external rotation 4+  Knee flexion 5  Knee extension 4  Ankle dorsiflexion 4+  Ankle plantarflexion 4  Ankle inversion 4  Ankle eversion 3+   (Blank rows = not tested)  LOWER EXTREMITY FLEXIBILITY; HS Right 48, Left 53, Very tight ITB and piriformis B, Bilateral gastroc  FUNCTIONAL TESTS:  EVAL: 30 sec sit to stand:  8 using hands at knees (below avg for age)  4 position balance test able to maintain 10 sec, some sway with tandem stance, left greater than right SLS  Able to turn full circle in both directions without LOB, able to stand with eyes closed 20 seconds feet slightly apart   TUG; 16 seconds, short steps, toe out, decreased heel strike.  GAIT: Distance walked: 40 Assistive device utilized: None Level of  assistance: SBA Comments: short ,choppy steps, decreased heel strike, toe out   LOWER EXTREMITY FUNCTIONAL SCALE; 22   TODAY'S TREATMENT:                                                                                                                                         DATE:  08/10/2023 Discussed progress towards goals for recert and remaining deficits TUG 2 trials 12 sec, 14 sec, avg 13 sec 30 second sit to stand:9 reps SLS  3 reps to failure B, then 20 sec on  left, 16 on right after several trials; inconsistent Ambulated 318 ft to ortho gym and back and stopped at steps;4 steps up and down working on eccentric control  Ax beam x 6 lengths forward and sideways with intermittent hand touch for balance Postural theraband;B Scapular retraction x 15 red, B shoulder ext x 15, single arm ER ea x 10 with red  Purple balance pad;reviewed HEP;marching, heel raises, half tandem stance B x 2 ea, tandem stance B x 2 ea Vector reaches x 5 with toe touch, tried without toe touch but was difficult    08/03/2023 Nu Step seat 6, UE 8, Lev 6  x 6 min to warm up, 397 steps Incline stretch at parallel bars x 3 x 20 sec Mini squats holding bar x5 emphasis on proper form then to high table without hand hold for proper form and to strengthen legs for stairs and sit to stand Tandem stance bilateral x 2 on ax pad to failure Vector reaches x 5 ea on floor,  DLS on ax x 30 sec SLS on ax x 3 ea to failure Ambulated 331 ft with stop at stairs;up and down 4 stairs  x 4 with rail with emphasis on eccentric control and increased step length with walking. Sit to stand 2 x 5 no hands emphasis on eccentric control 5 ea on oval disc to failure, hand touch prn Ambulated 318 ft with emphasis on increased step length  07/31/2023 Nu Step seat 6, UE 8, Lev 6  x 5 min to warm up, 333 steps Incline stretch at parallel bars x 3 x 20 sec Marching no hands 2 x 10 CGA on ax Heel raises with  x 15 no HH on ax Tandem stance x 2 ea on ax x 30 sec, 1-2 quick hand touches 2nd trial for each Vector reaches x 5 B Sit to stand x 5 Ambulated to ortho gym, 4 steps with reciprocal gait x 4 up and down and back to cancer gym with longer step lengths and better arm swing. Postural theraband;scapular retraction red x 10, bilateral extension red x 10, Bilateral ER yellow x 5, single arm yellow x 5 Sit to stand x 5 arms crossed Ambulated ortho gym and back with emphasis again on increased step length and  arm swing. Airex beam 6 lengths forward with intermittent hand  touch and 6 lengths sideways intermittent hand touch 07/26/2023 Nu Step seat 6, UE 8, Lev 5  x 7 min to warm up, 518 steps Incline stretch at parallel bars x 3 x 20 sec Marching no hands 2 x 10 CGA  Heel raises with  x 15 no HH on ax Vector reaches x 5 ea B with toe touch, x 3 without toe touch Ax beam x 6 lengths forward and 6 lengths sideways cGA Ambulated to ortho gym and went up and down 4 steps with railing and reciprocal gait, and ambulated back with emphasis on increasing step length Sit to stand from chair 10 with emphasis on head over feet and control Postural theraband;scapular retraction red x 10, bilateral extension red x 10, Bilateral ER with red x 5, yellow  x 5  07/23/2023 Nu Step seat 6, UE 8, Lev 5  x 6 min to warm up, 412 steps Incline stretch at parallel bars x 3 x 20 sec Marching no hands 2 x 10 CGA on ax Heel raises with HH x 10, x 10 no HH on ax Ax beam x 6 lengths forward and 6 lengths sideways cGA Step and hold on ax forward and sideways x 10 B with only occasional hand touch;CGA of PT Ambulated to ortho gym back wall and back, SBA with emphasis on heel strike/toe off, Stopped at stair case in ortho gym and using railing ambulated up and down 4 steps x 4 reps LT x 4 ea side Bilateral HS stretch with strap x 3 ea, 20 sec ea Rolling to bilateral ITB in SL 3 min ea 07/19/2023 Nu Step seat 6, UE 8, Lev 5  x 5 min to warm up Incline stretch at parallel bars x 3 x 20 sec Marching no hands 2 x 10 CGA on ax Heel raises with HH x 10, x 10 no HH on ax Step up on ax x 10 forward B, x 10 sideways B with CGA Ax beam x 6 lengths forward and 6 lengths sideways 6 in step ups with HH x 10 ea Ambulated to ortho gym back wall and back, SBA with emphasis on heel strike/toe off, 303 ft 1st walk, 313 second walk Sit to stand x 10 with some use of hands on legs   07/16/2023 Nu Step seat 6, UE 8, Lev 5  x 5 min to warm  up Incline stretch at parallel bars x 3 x 20 sec Marching no hands 2 x 10 CGA on ax Heel raises with HH x 10, x 10 no HH on ax Step up on ax x 10 forward B, x 10 sideways B with CGA Ambulated to ortho gym back wall and back, CGA with emphasis on heel strike/toe off Ax beam x 4 lengths ea; forward and sideways with CGA of PT Bridging x10, ppt x 10 Ppt with march x 10 Ppt with ball squeeze between knees x 7 Bilateral ITB and hamstring stretches x 3 done by PT with 20 sec hold Updated HEP 07/09/2023 Nu Step seat 7, UE 8, Lev 5  x 5 minto warm up Incline stretch at parallel bars x 3 x 20 sec Marching no hands 2 x 10 CGA Heel raises with HH x 10, x 10 no HH Step up on ax x 10 forward B, x 10 sideways B with CGA Ambulated in cancer gym with CGA emphasis on heel strike, toe off and lengthening stride slightly Bilateral piriformis stretch x 3 x 20 sec Bilateral ITB rolling  in SL  07/04/2022 Nu Step seat 7, UE 8, Lev 5 to warm up Piriformis stretch x 3 B supine x 20 sec HS stretch with strap x 3 B 30 sec Gastroc stretch with strap in sitting x 3 Bilateral ITB rolling x 4 min ea in sidelying Updated HEP with pics 06/15/2023 Discussed POC, visit number, treatment interventions. Will include aquatic therapy but not to start and only if she wants to. Set timer for 6O min and get up to move around  PATIENT EDUCATION:  Access Code: [email or web address] URL: [email or web address] Date: 07/16/2023 Prepared by: Alvira Monday  Exercises - Supine Bridge  - 1 x daily - 7 x weekly - 1 sets - 10 reps - Supine Pelvic Tilt  - 1 x daily - 7 x weekly - 1 sets - 10 reps - Supine Hip Adduction Isometric with Ball  - 1 x daily - 7 x weekly - 1 sets - 10 reps Access Code: [email or web address] URL: [email or web address] Date: 07/05/2023 Prepared by: Alvira Monday  Exercises - Supine Hamstring Stretch with Strap  - 1 x daily - 7 x weekly - 1 sets - 3 reps - 20-30 hold - Supine Hip External Rotation  Stretch  - 1 x daily - 7 x weekly - 3 sets - 3 reps - 20-30 hold - Seated Calf Stretch with Strap  - 1 x daily - 7 x weekly - 1 sets - 3 reps - 20-30 hold - Supine ITB Stretch with Strap  - 1 x daily - 7 x weekly - 1 sets - 3 reps - 20 hold Education details: POC, visit number, treatment interventions. Will include aquatic therapy but not to start and only if she wants to Person educated: Patient Education method: Explanation Education comprehension: verbalized understanding  HOME EXERCISE PROGRAM: Set alarm for 60 min to get up and walk around house  ASSESSMENT: Treatment  continues to focus on balance, function, gait and strength with emphasis on controlling eccentric phase of stand to sit, and descending steps, as well as continued emphasis on pt lengthening her step length to reduce short choppy steps. She has achieved all STG's and 4/7 LTGS established. She is ambulating more consistently with improved step length and heel toe gait, and achieved her STG for TUG. She has not yet achieved her LTG for TUG.. Balance is inconsistent and when focused and erect notes good improvement. She continues to have deficits in her 30 second sit to stand test and this combined with her TUG and balance continues to place her at higher risk of falls. She will benefit from skilled PT to continue to improve on deficits as she begins to transition to the gym.   OBJECTIVE IMPAIRMENTS: Abnormal gait, decreased activity tolerance, decreased balance, decreased strength, impaired flexibility, impaired sensation, postural dysfunction, and pain.   ACTIVITY LIMITATIONS: carrying, bending, standing, squatting, stairs, and locomotion level  PARTICIPATION LIMITATIONS: cleaning, shopping, and community activity  PERSONAL FACTORS: Age and 1-2 comorbidities: Multiple Myeloma and current treatments  are also affecting patient's functional outcome.   REHAB POTENTIAL: Good  CLINICAL DECISION MAKING:  Stable/uncomplicated  EVALUATION COMPLEXITY: Low  GOALS: Goals reviewed with patient? Yes  SHORT TERM GOALS: Target date: 07/12/2022  Pt will be independent with HEP to improve flexibility and strength Baseline: Goal status: MET but not consistently due to fatigue 2.  Pt will perform TUG in 13 seconds to decrease fall risk Baseline:  Goal status: MET 08/10/2023 3.  Pt will be able to perform  9 sit  to stands in 30 seconds for average rating for her age Baseline: 8 Goal status: MET 08/10/2023  4.  Pt will have decreased right ITB pain by 25% Baseline:  Goal status: MET 08/10/2023 5.  Pt will ambulate with improved stride length and heel strike for more normal gait Baseline Goal status: MET 08/03/2023  LONG TERM GOALS: Target date: 08/09/2022  Pt will be able to don socks with improved ease Baseline:  Goal status: MET 08/03/2023  2.  Pt will have decreased right ITB pain by 50% or greater Baseline:  Goal status: MET 08/10/2023  3.  Pt will be able to get out of her car without right ITB pain Baseline:  Goal status: MET 07/15/2022  4.  LEFS will be increased to 40 to demonstrate improved function Baseline:  Goal status: MET 08/10/2023 (41)  5.  Pt will perform TUG in 11 seconds  or less to demonstrate decreased fall risk Baseline:  Goal status: In progress (13 today)  6.  Pt will perform SLS for 14 seconds on each side to achieve age related norms Baseline:  Goal status: In progress, inconsistent able to do multiple shorter   7.  Pt will return to riding the bike at the gym for atleast 10 min 2x's per week in addition to her HEP.  Baseline;not going  Goal Status; NEW PLAN:  PT FREQUENCY: 2x/week for 1 week, then 1x/week for 5 weeks as pt transitions to gym program  PT DURATION: 8 weeks  PLANNED INTERVENTIONS: 97164- PT Re-evaluation, 97110-Therapeutic exercises, 97530- Therapeutic activity, 97112- Neuromuscular re-education, 97535- Self Care, 96045- Manual therapy,  [phone]- Gait training, and [phone]- Aquatic Therapy  PLAN FOR NEXT SESSION:  add UE strength, ITB stretches;already gave pics, rolling to bilateral ITB, strength, balance; HEP; mini squats, sit to stand, stretches etc   Waynette Buttery, PT 08/10/2023, 12:05 PM

## 2023-08-13 ENCOUNTER — Ambulatory Visit: Payer: Medicare HMO

## 2023-08-14 ENCOUNTER — Other Ambulatory Visit: Payer: Self-pay | Admitting: Hematology and Oncology

## 2023-08-14 ENCOUNTER — Telehealth: Payer: Self-pay

## 2023-08-14 ENCOUNTER — Other Ambulatory Visit: Payer: Self-pay

## 2023-08-14 DIAGNOSIS — C9002 Multiple myeloma in relapse: Secondary | ICD-10-CM

## 2023-08-14 DIAGNOSIS — C946 Myelodysplastic disease, not classified: Secondary | ICD-10-CM

## 2023-08-14 NOTE — Telephone Encounter (Signed)
I canceled her appt next month I do not know how to order T-cell helper lab; her insurance may not cover the test here

## 2023-08-14 NOTE — Telephone Encounter (Signed)
She called and left a message. She has a virtual visit w/ Dr. Barbaraann Boys to discuss M spike on 3/4. She no longer needs IVIG, recent lab value was high. She is asking if you can order T cell helper lab for [facility]?

## 2023-08-14 NOTE — Telephone Encounter (Signed)
Called back and given below message. Dr. Bertis Ruddy added the T-cell helper lab to next lab appt.Jean Davidson is aware of appts.

## 2023-08-15 ENCOUNTER — Ambulatory Visit: Payer: Medicare HMO

## 2023-08-15 DIAGNOSIS — Z9481 Bone marrow transplant status: Secondary | ICD-10-CM

## 2023-08-15 DIAGNOSIS — R2681 Unsteadiness on feet: Secondary | ICD-10-CM

## 2023-08-15 DIAGNOSIS — G620 Drug-induced polyneuropathy: Secondary | ICD-10-CM

## 2023-08-15 DIAGNOSIS — R5381 Other malaise: Secondary | ICD-10-CM

## 2023-08-15 DIAGNOSIS — C9002 Multiple myeloma in relapse: Secondary | ICD-10-CM

## 2023-08-15 NOTE — Therapy (Signed)
OUTPATIENT PHYSICAL THERAPY ONCOLOGY TREATMENT  Patient Name: Jean Davidson MRN: [id number] DOB:April 20, 1946, 78 y.o., female Today's Date: 08/15/2023  END OF SESSION:  PT End of Session - 08/15/23 1139     Visit Number 11    Number of Visits 17    Date for PT Re-Evaluation 09/21/23    PT Start Time 1140    PT Stop Time 1239    PT Time Calculation (min) 59 min    Activity Tolerance Patient tolerated treatment well    Behavior During Therapy [facility] for tasks assessed/performed             Past Medical History:  Diagnosis Date   Anemia    Anxiety    Blood transfusion without reported diagnosis    Carotid stenosis    Mild bilateral   Carpal tunnel syndrome    Cataract    Cervical disc disease    c7   Cough 07/27/2015   Disc degeneration    Dysuria 07/26/2015   Failure of stem cell transplant (HCC)    Fatigue 01/26/2015   Fever 07/27/2015   GERD (gastroesophageal reflux disease)    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   Multiple myeloma (HCC)    Osteopenia    Pinched nerve    Sinusitis, bacterial 07/27/2015   Spinal stenosis in cervical region    Varicose veins of lower extremities with inflammation    Past Surgical History:  Procedure Laterality Date   ANKLE SURGERY Left    BLADDER SUSPENSION  1988/1989   x2   COLONOSCOPY     ESOPHAGOGASTRODUODENOSCOPY     IR IMAGING GUIDED PORT INSERTION  03/01/2018   LIMBAL STEM CELL TRANSPLANT     x2   PORT-A-CATH REMOVAL     SHOULDER SURGERY Right 04/06/2013   right   UPPER GASTROINTESTINAL ENDOSCOPY     Patient Active Problem List   Diagnosis Date Noted   Closed left ankle fracture 06/16/2022   Fluid overload 04/17/2022   Anemia due to antineoplastic chemotherapy 06/24/2021   Sinusitis 06/13/2021    Neutropenic fever (HCC) 04/05/2021   Hyperglycemia, drug-induced 07/29/2020   Financial difficulties 05/27/2020   Bilateral cataracts 12/04/2019   Headache disorder 09/11/2019   Weight gain 09/11/2019   Skin lesion of back 08/07/2019   Pelvic floor dysfunction in female 07/09/2019   Alopecia 07/09/2019   Squamous cell cancer of skin of left temple 06/04/2019   Exertional shortness of breath 02/14/2019   Vitamin B12 deficiency 09/25/2018   Large hiatal hernia 07/26/2018   Belching 07/26/2018   Early satiety 07/26/2018   URI (upper respiratory infection) 06/25/2018   Lung nodule seen on imaging study 06/25/2018   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/11/2018   Tinnitus of both ears 04/10/2018   Preventive measure 04/10/2018   Goals of care, counseling/discussion 02/20/2018   Pain of left scapula 12/06/2017   Vitamin D deficiency 11/12/2017   Yeast infection of the skin 09/06/2017   Family hx of colon cancer requiring screening colonoscopy 01/17/2017   Cystocele and rectocele with incomplete uterovaginal prolapse 11/22/2016   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   Bilateral pleural effusion 01/25/2016   Pericardial effusion 01/24/2016   Physical deconditioning 01/11/2016   S/P autologous bone marrow transplantation (HCC) 11/08/2015  Therapeutic drug monitoring 11/08/2015   Red blood cell antibody positive, compatible PRBC difficult to obtain 07/30/2015   Cough 07/27/2015   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   Fatigue 01/26/2015   Essential hypertension 01/26/2015   Other fatigue 01/26/2015   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/2015   Chronic recurrent sinusitis 10/23/2014   Conjunctivitis 10/23/2014   Status post allogeneic bone marrow transplant (HCC) 08/14/2014   Leukopenia due to antineoplastic chemotherapy (HCC) 08/10/2014   Patient in clinical research study 07/21/2014   Need for prophylactic vaccination and  inoculation against influenza 02/24/2014   Diarrhea 02/03/2014   Leukocytosis 02/03/2014   Constipation 11/21/2013   Rash 11/21/2013   Back pain 11/21/2013   Anxiety 11/21/2013   Multiple myeloma in relapse (HCC) 11/07/2013   Pancytopenia, acquired (HCC) 10/29/2013   Thrombocytopenia (HCC) 10/29/2013   Persistent cough for 3 weeks or longer 10/02/2013   Abnormal weight loss 10/02/2013   Normocytic anemia 08/22/2013   Abdominal bloating 08/22/2013   Hypersensitivity 08/07/2013   Other malaise and fatigue 08/07/2013   Osteopenia 08/05/2013   GERD (gastroesophageal reflux disease) 01/16/2013   Insomnia disorder 01/16/2013   Carotid stenosis    Cervical disc disease    Hypertension 04/15/2012   Palpitations 03/29/2012    PCP: Lorine Bears, MD  REFERRING PROVIDER: Artis Delay, MD  REFERRING DIAG: Deconditioning  THERAPY DIAG:  Multiple myeloma in relapse (HCC)  History of bone marrow transplant (HCC)  Chemotherapy-induced neuropathy (HCC)  Physical deconditioning  Gait instability  ONSET DATE: 1 year ago  Rationale for Evaluation and Treatment: Rehabilitation  SUBJECTIVE:                                                                                                                                                                                           SUBJECTIVE STATEMENT:  I had my lab work. 2 of my numbers out of 3 have increased.I am not in remission anymore. I don't have a plan yet but I have a zoom meeting on March 4. I have not made it to the gym yet. I didn't go to the gym after I got that news  EVAL I am so achy in my legs the longer I move or stand.. It improves if I move around. I don't feel sure footed due to neuropathy from chemo. I wobble when I walk.I want to do more. I want to be able to sit on the floor for my 43 year old granddaughter. Balance is a concern. I can get up and down from chairs but I do it slowly and I use my arms.  Right lateral thigh  feels very tight  and has been since my Left ankle fx. I have fallen and broken my wrist and ring finger  and have a right reverse TSA.(2014). I don't meet my 5,000 step goal. I Feel tight in both calves. I have trouble getting out of my car because of  leg (ITB) pain on right, and difficulty putting on my socks.  PERTINENT HISTORY:  Pt has a hx of Multiple Myeloma initially diagnosed in 2015. Underwent chemotherapy with several different ones due to side effects. 07/23/2014 underwent a Bone Marrow Transplant at [facility] She has had multiple bone marrow biopsies over the years and changes of chemotherapy. She received an allogenic stem cell transplant in April 2017. She is currently on high dose IVIG treatment.  Had a CAR-T in  [facility] April. She is very deconditioned. It is just in the marrow, and not in the bone. Fx left ankle last year and had ORIF in Nov. 23.  PAIN:  Are you having pain? NO,   PRECAUTIONS: Multiple Myeloma, macular degeneration, peripheral neuropathy, myelodisplastic syndrome,carotid stenosis(mild), Right reverse TSA, left ankle ORIF  WEIGHT BEARING RESTRICTIONS: No  FALLS:  Has patient fallen in last 6 months? No, but is at risk  LIVING ENVIRONMENT: Lives with: lives alone Lives in: House/apartment  OCCUPATION: retired Engineer, site  LEISURE: arts and Presenter, broadcasting,  HAND DOMINANCE: left   PRIOR LEVEL OF FUNCTION: Independent  PATIENT GOALS: Reduce achiness, improve confidence, improve balance,   OBJECTIVE: Note: Objective measures were completed at Evaluation unless otherwise noted.  COGNITION: Overall cognitive status: Within functional limits for tasks assessed   PALPATION: Very tender full length of bilateral ITB  OBSERVATIONS / OTHER ASSESSMENTS:    SENSATION: Light touch: deficits bilateral feet  POSTURE: forward head, rounded shoulders  UPPER EXTREMITY AROM/PROM:  A/PROM RIGHT   eval   Shoulder extension   Shoulder flexion   Shoulder  abduction   Shoulder internal rotation   Shoulder external rotation     (Blank rows = not tested)  A/PROM LEFT   eval  Shoulder extension   Shoulder flexion   Shoulder abduction   Shoulder internal rotation   Shoulder external rotation     (Blank rows = not tested)  CERVICAL AROM: NT      UPPER EXTREMITY STRENGTH:    LOWER EXTREMITY AROM/PROM:  MMT Right eval  Hip flexion 4+  Hip extension Can bridge  Hip abduction 4+/5 sit  Hip adduction 4+/5 sit  Hip internal rotation 4  Hip external rotation 4+  Knee flexion 5  Knee extension 4  Ankle dorsiflexion 5  Ankle plantarflexion 5/5  Ankle inversion 4+  Ankle eversion 4+   (Blank rows = not tested)  MMT LEFT eval  Hip flexion 4  Hip extension Can bridge  Hip abduction 4+ sit  Hip adduction 4+ sit  Hip internal rotation 4  Hip external rotation 4+  Knee flexion 5  Knee extension 4  Ankle dorsiflexion 4+  Ankle plantarflexion 4  Ankle inversion 4  Ankle eversion 3+   (Blank rows = not tested)  LOWER EXTREMITY FLEXIBILITY; HS Right 48, Left 53, Very tight ITB and piriformis B, Bilateral gastroc  FUNCTIONAL TESTS:  EVAL: 30 sec sit to stand:  8 using hands at knees (below avg for age)  4 position balance test able to maintain 10 sec, some sway with tandem stance, left greater than right SLS  Able to turn full circle in both directions without LOB, able to stand with eyes closed 20 seconds  feet slightly apart   TUG; 16 seconds, short steps, toe out, decreased heel strike.  GAIT: Distance walked: 40 Assistive device utilized: None Level of assistance: SBA Comments: short ,choppy steps, decreased heel strike, toe out   LOWER EXTREMITY FUNCTIONAL SCALE; 22   TODAY'S TREATMENT:                                                                                                                                         DATE:   08/15/2023 Nu Step seat 6, UE 8, Lev 6  x 6 min to warm up, 398 steps Incline  stretch at parallel bars x 3 x 20 sec Mini squats holding bar x5 emphasis on proper form then to high table without hand hold for proper form and to strengthen legs for stairs and sit to stand x 8. Held due to back/neck pain Tandem stance bilateral x 2 on ax pad to failure Ambulated 281 feet then stairs in ortho gym x 3 up and down and return to cancer gym.seated rest Ax beam x 6 lengths forward and 6 lengths sideways with intermittent HH Jobes flex and scaption against wall x 10 Cable retraction 3# ea 2x 10 Standing extension yellow 2 x 10 SL on ax pad x 4 ea to failure Ambulated 331 ft with emphasis on erect posture and longer step length Marching on ax x 20 Heel raises on ax x15 Vector reaches B x3 with toe touch, x 3 ea no touch 08/10/2023 Discussed progress towards goals for recert and remaining deficits TUG 2 trials 12 sec, 14 sec, avg 13 sec 30 second sit to stand:9 reps SLS  3 reps to failure B, then 20 sec on left, 16 on right after several trials; inconsistent Ambulated 318 ft to ortho gym and back and stopped at steps;4 steps up and down working on eccentric control  Ax beam x 6 lengths forward and sideways with intermittent hand touch for balance Postural theraband;B Scapular retraction x 15 red, B shoulder ext x 15, single arm ER ea x 10 with red  Purple balance pad;reviewed HEP;marching, heel raises, half tandem stance B x 2 ea, tandem stance B x 2 ea Vector reaches x 5 with toe touch, tried without toe touch but was difficult    08/03/2023 Nu Step seat 6, UE 8, Lev 6  x 6 min to warm up, 397 steps Incline stretch at parallel bars x 3 x 20 sec Mini squats holding bar x5 emphasis on proper form then to high table without hand hold for proper form and to strengthen legs for stairs and sit to stand Tandem stance bilateral x 2 on ax pad to failure Vector reaches x 5 ea on floor,  DLS on ax x 30 sec SLS on ax x 3 ea to failure Ambulated 331 ft with stop at stairs;up and down 4  stairs  x 4 with rail  with emphasis on eccentric control and increased step length with walking. Sit to stand 2 x 5 no hands emphasis on eccentric control 5 ea on oval disc to failure, hand touch prn Ambulated 318 ft with emphasis on increased step length  07/31/2023 Nu Step seat 6, UE 8, Lev 6  x 5 min to warm up, 333 steps Incline stretch at parallel bars x 3 x 20 sec Marching no hands 2 x 10 CGA on ax Heel raises with  x 15 no HH on ax Tandem stance x 2 ea on ax x 30 sec, 1-2 quick hand touches 2nd trial for each Vector reaches x 5 B Sit to stand x 5 Ambulated to ortho gym, 4 steps with reciprocal gait x 4 up and down and back to cancer gym with longer step lengths and better arm swing. Postural theraband;scapular retraction red x 10, bilateral extension red x 10, Bilateral ER yellow x 5, single arm yellow x 5 Sit to stand x 5 arms crossed Ambulated ortho gym and back with emphasis again on increased step length and arm swing. Airex beam 6 lengths forward with intermittent hand touch and 6 lengths sideways intermittent hand touch 07/26/2023 Nu Step seat 6, UE 8, Lev 5  x 7 min to warm up, 518 steps Incline stretch at parallel bars x 3 x 20 sec Marching no hands 2 x 10 CGA  Heel raises with  x 15 no HH on ax Vector reaches x 5 ea B with toe touch, x 3 without toe touch Ax beam x 6 lengths forward and 6 lengths sideways cGA Ambulated to ortho gym and went up and down 4 steps with railing and reciprocal gait, and ambulated back with emphasis on increasing step length Sit to stand from chair 10 with emphasis on head over feet and control Postural theraband;scapular retraction red x 10, bilateral extension red x 10, Bilateral ER with red x 5, yellow  x 5  07/23/2023 Nu Step seat 6, UE 8, Lev 5  x 6 min to warm up, 412 steps Incline stretch at parallel bars x 3 x 20 sec Marching no hands 2 x 10 CGA on ax Heel raises with HH x 10, x 10 no HH on ax Ax beam x 6 lengths forward and 6 lengths  sideways cGA Step and hold on ax forward and sideways x 10 B with only occasional hand touch;CGA of PT Ambulated to ortho gym back wall and back, SBA with emphasis on heel strike/toe off, Stopped at stair case in ortho gym and using railing ambulated up and down 4 steps x 4 reps LT x 4 ea side Bilateral HS stretch with strap x 3 ea, 20 sec ea Rolling to bilateral ITB in SL 3 min ea 07/19/2023 Nu Step seat 6, UE 8, Lev 5  x 5 min to warm up Incline stretch at parallel bars x 3 x 20 sec Marching no hands 2 x 10 CGA on ax Heel raises with HH x 10, x 10 no HH on ax Step up on ax x 10 forward B, x 10 sideways B with CGA Ax beam x 6 lengths forward and 6 lengths sideways 6 in step ups with HH x 10 ea Ambulated to ortho gym back wall and back, SBA with emphasis on heel strike/toe off, 303 ft 1st walk, 313 second walk Sit to stand x 10 with some use of hands on legs   07/16/2023 Nu Step seat 6, UE 8, Lev 5  x 5 min to warm up Incline stretch at parallel bars x 3 x 20 sec Marching no hands 2 x 10 CGA on ax Heel raises with HH x 10, x 10 no HH on ax Step up on ax x 10 forward B, x 10 sideways B with CGA Ambulated to ortho gym back wall and back, CGA with emphasis on heel strike/toe off Ax beam x 4 lengths ea; forward and sideways with CGA of PT Bridging x10, ppt x 10 Ppt with march x 10 Ppt with ball squeeze between knees x 7 Bilateral ITB and hamstring stretches x 3 done by PT with 20 sec hold Updated HEP 07/09/2023 Nu Step seat 7, UE 8, Lev 5  x 5 minto warm up Incline stretch at parallel bars x 3 x 20 sec Marching no hands 2 x 10 CGA Heel raises with HH x 10, x 10 no HH Step up on ax x 10 forward B, x 10 sideways B with CGA Ambulated in cancer gym with CGA emphasis on heel strike, toe off and lengthening stride slightly Bilateral piriformis stretch x 3 x 20 sec Bilateral ITB rolling in SL  07/04/2022 Nu Step seat 7, UE 8, Lev 5 to warm up Piriformis stretch x 3 B supine x 20 sec HS  stretch with strap x 3 B 30 sec Gastroc stretch with strap in sitting x 3 Bilateral ITB rolling x 4 min ea in sidelying Updated HEP with pics 06/15/2023 Discussed POC, visit number, treatment interventions. Will include aquatic therapy but not to start and only if she wants to. Set timer for 6O min and get up to move around  PATIENT EDUCATION:  Access Code: [email or web address] URL: [email or web address] Date: 07/16/2023 Prepared by: Alvira Monday  Exercises - Supine Bridge  - 1 x daily - 7 x weekly - 1 sets - 10 reps - Supine Pelvic Tilt  - 1 x daily - 7 x weekly - 1 sets - 10 reps - Supine Hip Adduction Isometric with Ball  - 1 x daily - 7 x weekly - 1 sets - 10 reps Access Code: [email or web address] URL: [email or web address] Date: 07/05/2023 Prepared by: Alvira Monday  Exercises - Supine Hamstring Stretch with Strap  - 1 x daily - 7 x weekly - 1 sets - 3 reps - 20-30 hold - Supine Hip External Rotation Stretch  - 1 x daily - 7 x weekly - 3 sets - 3 reps - 20-30 hold - Seated Calf Stretch with Strap  - 1 x daily - 7 x weekly - 1 sets - 3 reps - 20-30 hold - Supine ITB Stretch with Strap  - 1 x daily - 7 x weekly - 1 sets - 3 reps - 20 hold Education details: POC, visit number, treatment interventions. Will include aquatic therapy but not to start and only if she wants to Person educated: Patient Education method: Explanation Education comprehension: verbalized understanding  HOME EXERCISE PROGRAM: Set alarm for 60 min to get up and walk around house  ASSESSMENT: Treatment  continues to focus on balance, function, gait and strength. Pt was not as focused at times and she had more difficulty with balance today. We held minisquats to table due to complaints of neck and LBP. Pain resolved quickly and she was able to continue with treatment. .   OBJECTIVE IMPAIRMENTS: Abnormal gait, decreased activity tolerance, decreased balance, decreased strength, impaired flexibility,  impaired sensation, postural dysfunction, and pain.   ACTIVITY LIMITATIONS: carrying, bending, standing, squatting, stairs, and locomotion  level  PARTICIPATION LIMITATIONS: cleaning, shopping, and community activity  PERSONAL FACTORS: Age and 1-2 comorbidities: Multiple Myeloma and current treatments  are also affecting patient's functional outcome.   REHAB POTENTIAL: Good  CLINICAL DECISION MAKING: Stable/uncomplicated  EVALUATION COMPLEXITY: Low  GOALS: Goals reviewed with patient? Yes  SHORT TERM GOALS: Target date: 07/12/2022  Pt will be independent with HEP to improve flexibility and strength Baseline: Goal status: MET but not consistently due to fatigue 2.  Pt will perform TUG in 13 seconds to decrease fall risk Baseline:  Goal status: MET 08/10/2023 3.  Pt will be able to perform  9 sit to stands in 30 seconds for average rating for her age Baseline: 8 Goal status: MET 08/10/2023  4.  Pt will have decreased right ITB pain by 25% Baseline:  Goal status: MET 08/10/2023 5.  Pt will ambulate with improved stride length and heel strike for more normal gait Baseline Goal status: MET 08/03/2023  LONG TERM GOALS: Target date: 08/09/2022  Pt will be able to don socks with improved ease Baseline:  Goal status: MET 08/03/2023  2.  Pt will have decreased right ITB pain by 50% or greater Baseline:  Goal status: MET 08/10/2023  3.  Pt will be able to get out of her car without right ITB pain Baseline:  Goal status: MET 07/15/2022  4.  LEFS will be increased to 40 to demonstrate improved function Baseline:  Goal status: MET 08/10/2023 (41)  5.  Pt will perform TUG in 11 seconds  or less to demonstrate decreased fall risk Baseline:  Goal status: In progress (13 today)  6.  Pt will perform SLS for 14 seconds on each side to achieve age related norms Baseline:  Goal status: In progress, inconsistent able to do multiple shorter   7.  Pt will return to riding the bike at the gym  for atleast 10 min 2x's per week in addition to her HEP.  Baseline;not going  Goal Status; NEW PLAN:  PT FREQUENCY: 2x/week for 1 week, then 1x/week for 5 weeks as pt transitions to gym program  PT DURATION: 8 weeks  PLANNED INTERVENTIONS: 97164- PT Re-evaluation, 97110-Therapeutic exercises, 97530- Therapeutic activity, 97112- Neuromuscular re-education, 97535- Self Care, 16109- Manual therapy, [phone]- Gait training, and [phone]- Aquatic Therapy  PLAN FOR NEXT SESSION: mat exercises; bridge, march etc. add UE strength, ITB stretches;already gave pics, rolling to bilateral ITB prn, strength, balance; HEP; mini squats, sit to stand, stretches etc   Waynette Buttery, PT 08/15/2023, 12:42 PM

## 2023-08-28 ENCOUNTER — Telehealth: Payer: Self-pay

## 2023-08-28 NOTE — Telephone Encounter (Signed)
 Called her back and scheduled appt for 3/7 at 1240, she is aware of appt.

## 2023-08-29 ENCOUNTER — Ambulatory Visit: Payer: Medicare HMO

## 2023-08-31 ENCOUNTER — Inpatient Hospital Stay

## 2023-08-31 ENCOUNTER — Inpatient Hospital Stay: Attending: Hematology and Oncology | Admitting: Hematology and Oncology

## 2023-08-31 ENCOUNTER — Encounter: Payer: Self-pay | Admitting: Hematology and Oncology

## 2023-08-31 VITALS — BP 122/71 | HR 91 | Temp 99.1°F | Resp 18 | Ht 62.0 in | Wt 161.4 lb

## 2023-08-31 DIAGNOSIS — Z9484 Stem cells transplant status: Secondary | ICD-10-CM | POA: Diagnosis not present

## 2023-08-31 DIAGNOSIS — C946 Myelodysplastic disease, not classified: Secondary | ICD-10-CM

## 2023-08-31 DIAGNOSIS — C9002 Multiple myeloma in relapse: Secondary | ICD-10-CM | POA: Diagnosis not present

## 2023-08-31 DIAGNOSIS — D6481 Anemia due to antineoplastic chemotherapy: Secondary | ICD-10-CM

## 2023-08-31 DIAGNOSIS — D539 Nutritional anemia, unspecified: Secondary | ICD-10-CM | POA: Insufficient documentation

## 2023-08-31 DIAGNOSIS — H269 Unspecified cataract: Secondary | ICD-10-CM | POA: Insufficient documentation

## 2023-08-31 LAB — COMPREHENSIVE METABOLIC PANEL
ALT: 14 U/L (ref 0–44)
AST: 21 U/L (ref 15–41)
Albumin: 4.1 g/dL (ref 3.5–5.0)
Alkaline Phosphatase: 88 U/L (ref 38–126)
Anion gap: 7 (ref 5–15)
BUN: 16 mg/dL (ref 8–23)
CO2: 28 mmol/L (ref 22–32)
Calcium: 9 mg/dL (ref 8.9–10.3)
Chloride: 106 mmol/L (ref 98–111)
Creatinine, Ser: 0.78 mg/dL (ref 0.44–1.00)
GFR, Estimated: >60 mL/min (ref >60)
Glucose, Bld: 95 mg/dL (ref 70–99)
Potassium: 3.7 mmol/L (ref 3.5–5.1)
Sodium: 141 mmol/L (ref 135–145)
Total Bilirubin: 0.3 mg/dL (ref 0.0–1.2)
Total Protein: 7.6 g/dL (ref 6.5–8.1)

## 2023-08-31 LAB — CBC WITH DIFFERENTIAL/PLATELET
Abs Immature Granulocytes: 0.01 10*3/uL (ref 0.00–0.07)
Basophils Absolute: 0 10*3/uL (ref 0.0–0.1)
Basophils Relative: 0 %
Eosinophils Absolute: 0 10*3/uL (ref 0.0–0.5)
Eosinophils Relative: 0 %
HCT: 34.4 % — ABNORMAL LOW (ref 36.0–46.0)
Hemoglobin: 11.2 g/dL — ABNORMAL LOW (ref 12.0–15.0)
Immature Granulocytes: 0 %
Lymphocytes Relative: 35 %
Lymphs Abs: 1.7 10*3/uL (ref 0.7–4.0)
MCH: 35.1 pg — ABNORMAL HIGH (ref 26.0–34.0)
MCHC: 32.6 g/dL (ref 30.0–36.0)
MCV: 107.8 fL — ABNORMAL HIGH (ref 80.0–100.0)
Monocytes Absolute: 0.6 10*3/uL (ref 0.1–1.0)
Monocytes Relative: 11 %
Neutro Abs: 2.5 10*3/uL (ref 1.7–7.7)
Neutrophils Relative %: 54 %
Platelets: 155 10*3/uL (ref 150–400)
RBC: 3.19 MIL/uL — ABNORMAL LOW (ref 3.87–5.11)
RDW: 14.6 % (ref 11.5–15.5)
WBC: 4.8 10*3/uL (ref 4.0–10.5)
nRBC: 0 % (ref 0.0–0.2)

## 2023-08-31 LAB — T-HELPER CELLS (CD4) COUNT (NOT AT ARMC)
CD4 % Helper T Cell: 17 % — ABNORMAL LOW (ref 33–65)
CD4 T Cell Abs: 258 /uL — ABNORMAL LOW (ref 400–1790)

## 2023-08-31 NOTE — Progress Notes (Signed)
 [facility] OFFICE PROGRESS NOTE  Patient Care Team: Patient, [name] as PCP - General (General Practice) Iran Ouch, MD as PCP - Cardiology (Cardiology) Astrid Drafts, MD as Referring Physician (Hematology and Oncology) Eddie Candle, MD as Consulting Physician (Internal Medicine) Daiva Eves, Lisette Grinder, MD as Consulting Physician (Infectious Diseases) Lorriane Shire An, MD as Consulting Physician (Hematology and Oncology) Sadie Haber, NP as Nurse Practitioner (Hematology and Oncology) Artis Delay, MD as Consulting Physician (Hematology and Oncology)  Assessment & Plan Multiple myeloma in relapse [facility]) 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 She subsequently received allogenic stem cell transplant with good response to treatment but unfortunately relapsed She received salvage chemotherapy followed by infusion of CAR-T cell in April 2024 with mild residual disease seen after blood count recovery The patient is on active surveillance Unfortunately, she is noted to have signs of disease relapse in December 2024 She had 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 I have reviewed documentation from her oncologist at [facility] from March 4 in great detail  We discussed risk, benefits, side effects of combination of bispecific therapy versus combination of selinexor, pomalidomide and dexamethasone Previously, the patient had progressed on treatment with lenalidomide, pomalidomide, daratumumab and Velcade We discussed poor prognosis related to her disease and frequent relapses While bispecific therapies may yield better response rates, treatment side effects are quite toxic and I would not recommend her to be treated  in the absence of her family members being present close by I recommend the patient to seriously consider relocating to [place] to be closer to her daughter From my personal experience, I have not seen much benefit with combination of selinexor, pomalidomide and dexamethasone but it could be used as a bridging therapy until she can get relocated closer to her daughter  The patient is obviously very disappointed after today's visit She was shocked when I discussed high risks of CRS and ICAN associated with side effects of bispecific treatment and her overall poor prognosis She will go home and think about it I will get her labs rechecked today so that we can have myeloma panel for review next week  No orders of the defined types were placed in this encounter.    Artis Delay, MD  INTERVAL HISTORY: she returns for surveillance follow-up and for discussion about plan of care given recent signs of disease relapse She had telephone visit with her oncologist at [facility] which I have reviewed plan of care and recommendation Currently, she is not symptomatic We discussed risk and benefits of different treatment options as outlined above  PHYSICAL EXAMINATION: ECOG PERFORMANCE STATUS: 1 - Symptomatic but completely ambulatory  Vitals:   08/31/23 1231  BP: 122/71  Pulse: 91  Resp: 18  Temp: 99.1 F (37.3 C)  SpO2: 100%   Filed Weights   08/31/23 1231  Weight: 161 lb 6.4 oz (73.2 kg)

## 2023-08-31 NOTE — Assessment & Plan Note (Addendum)
 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 She subsequently received allogenic stem cell transplant with good response to treatment but unfortunately relapsed She received salvage chemotherapy followed by infusion of CAR-T cell in April 2024 with mild residual disease seen after blood count recovery The patient is on active surveillance Unfortunately, she is noted to have signs of disease relapse in December 2024 She had 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 I have reviewed documentation from her oncologist at [facility] from March 4 in great detail  We discussed risk, benefits, side effects of combination of bispecific therapy versus combination of selinexor, pomalidomide and dexamethasone Previously, the patient had progressed on treatment with lenalidomide, pomalidomide, daratumumab and Velcade We discussed poor prognosis related to her disease and frequent relapses While bispecific therapies may yield better response rates, treatment side effects are quite toxic and I would not recommend her to be treated in the absence of her family members being present close by I recommend the patient to seriously consider relocating to [place] to be closer to her daughter From my personal experience, I have not seen much benefit with combination of selinexor, pomalidomide and dexamethasone but it could be used as a bridging therapy until she can get relocated closer to her daughter  The patient is obviously very disappointed after today's visit She was shocked when I discussed high risks of CRS and ICAN associated with side effects of bispecific treatment and her overall poor prognosis She will go home and think about it I will get her labs rechecked today so that we can  have myeloma panel for review next week

## 2023-09-03 ENCOUNTER — Ambulatory Visit: Payer: Medicare HMO

## 2023-09-03 ENCOUNTER — Telehealth: Payer: Self-pay | Admitting: *Deleted

## 2023-09-03 LAB — MULTIPLE MYELOMA PANEL, SERUM
Albumin SerPl Elph-Mcnc: 3.5 g/dL (ref 2.9–4.4)
Albumin/Glob SerPl: 1 (ref 0.7–1.7)
Alpha 1: 0.3 g/dL (ref 0.0–0.4)
Alpha2 Glob SerPl Elph-Mcnc: 0.9 g/dL (ref 0.4–1.0)
B-Globulin SerPl Elph-Mcnc: 1 g/dL (ref 0.7–1.3)
Gamma Glob SerPl Elph-Mcnc: 1.6 g/dL (ref 0.4–1.8)
Globulin, Total: 3.8 g/dL (ref 2.2–3.9)
IgA: 267 mg/dL (ref 64–422)
IgG (Immunoglobin G), Serum: 1341 mg/dL (ref 586–1602)
IgM (Immunoglobulin M), Srm: 66 mg/dL (ref 26–217)
M Protein SerPl Elph-Mcnc: 0.5 g/dL — ABNORMAL HIGH
Total Protein ELP: 7.3 g/dL (ref 6.0–8.5)

## 2023-09-03 LAB — KAPPA/LAMBDA LIGHT CHAINS
Kappa free light chain: 12.1 mg/L (ref 3.3–19.4)
Kappa, lambda light chain ratio: 0.12 — ABNORMAL LOW (ref 0.26–1.65)
Lambda free light chains: 98.2 mg/L — ABNORMAL HIGH (ref 5.7–26.3)

## 2023-09-03 NOTE — Telephone Encounter (Signed)
 Jean Davidson called, stating she wanted to go ahead and get her cataract surgery scheduled. [facility] has tentatively offered her 3/20 for first eye and 4/3 for second one, pending clearance from Dr.Gorsuch. Gave patient fax number for office to give to her opthamologist. Patient verbalized understanding

## 2023-09-07 ENCOUNTER — Other Ambulatory Visit: Payer: Medicare HMO

## 2023-09-07 ENCOUNTER — Ambulatory Visit: Payer: Medicare HMO

## 2023-09-07 ENCOUNTER — Encounter: Payer: Self-pay | Admitting: Hematology and Oncology

## 2023-09-07 ENCOUNTER — Inpatient Hospital Stay: Payer: Medicare HMO | Admitting: Hematology and Oncology

## 2023-09-07 ENCOUNTER — Ambulatory Visit: Payer: Medicare HMO | Admitting: Hematology and Oncology

## 2023-09-07 VITALS — BP 147/77 | HR 62 | Temp 97.9°F | Resp 18 | Ht 62.0 in | Wt 159.1 lb

## 2023-09-07 DIAGNOSIS — Z7189 Other specified counseling: Secondary | ICD-10-CM | POA: Diagnosis not present

## 2023-09-07 DIAGNOSIS — C9002 Multiple myeloma in relapse: Secondary | ICD-10-CM | POA: Diagnosis not present

## 2023-09-07 DIAGNOSIS — H269 Unspecified cataract: Secondary | ICD-10-CM | POA: Diagnosis not present

## 2023-09-07 DIAGNOSIS — D539 Nutritional anemia, unspecified: Secondary | ICD-10-CM | POA: Diagnosis not present

## 2023-09-07 NOTE — Assessment & Plan Note (Addendum)
 She desire cataract surgery I completed surgical clearance form for her

## 2023-09-07 NOTE — Assessment & Plan Note (Addendum)
 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  I reviewed myeloma panel from last week with the patient She has signs of disease progression although currently she is not symptomatic  We discussed risk and benefits of different treatment options She is leaning towards bispecific treatment I reviewed side effects with the patient again and I am concerned about high risks of CRS and ICANS  She made it quite clear that relocation is not an option right now She is aware that if she wants to pursue this treatment, she would need to be treated at [facility] 2 of my partners can give her maintenance treatment after several cycles at [facility] I will see her next month for further follow-up

## 2023-09-07 NOTE — Assessment & Plan Note (Addendum)
 Multifactorial She is not symptomatic Observe closely

## 2023-09-07 NOTE — Assessment & Plan Note (Addendum)
 She had prior advanced directives and living will scanned We will give her a copy as requested

## 2023-09-07 NOTE — Progress Notes (Signed)
 [facility] OFFICE PROGRESS NOTE  Patient Care Team: Patient, [name] as PCP - General (General Practice) Iran Ouch, MD as PCP - Cardiology (Cardiology) Astrid Drafts, MD as Referring Physician (Hematology and Oncology) Eddie Candle, MD as Consulting Physician (Internal Medicine) Daiva Eves, Lisette Grinder, MD as Consulting Physician (Infectious Diseases) Lorriane Shire An, MD as Consulting Physician (Hematology and Oncology) Sadie Haber, NP as Nurse Practitioner (Hematology and Oncology) Artis Delay, MD as Consulting Physician (Hematology and Oncology)  Assessment & Plan Multiple myeloma in relapse [facility]) 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  I reviewed myeloma panel from last week with the patient She has signs of disease progression although currently she is not symptomatic  We discussed risk and benefits of different treatment options She is leaning towards bispecific treatment I reviewed side effects with the patient again and I am concerned about high risks of CRS and ICANS  She made it quite clear that relocation is not an option right now She is aware that if she wants to pursue this treatment, she would need to be treated at [facility] 2 of my partners can give her maintenance treatment after several cycles at  [facility] I will see her next month for further follow-up Deficiency anemia Multifactorial She is not symptomatic Observe closely  Goals of care, counseling/discussion She had prior advanced directives and living will scanned We will give her a copy as requested Cataract of both eyes, unspecified cataract type She desire cataract surgery I completed surgical clearance form for her  No orders of the defined types were placed in this encounter.    Artis Delay, MD  INTERVAL HISTORY: she returns for surveillance followup for recurrent multiple myeloma We discussed recent test results and future follow-up I completed paperwork for her to proceed with cataract surgery We discussed advance care planning  PHYSICAL EXAMINATION: ECOG PERFORMANCE STATUS: 1 - Symptomatic but completely ambulatory  Vitals:   09/07/23 0935  BP: (!) 147/77  Pulse: 62  Resp: 18  Temp: 97.9 F (36.6 C)  SpO2: 98%   Filed Weights   09/07/23 0935  Weight: 159 lb 1.6 oz (72.2 kg)    Relevant data reviewed during this visit included CBC, CMP and myeloma panel

## 2023-09-12 ENCOUNTER — Ambulatory Visit: Payer: Medicare HMO | Attending: Hematology and Oncology

## 2023-09-12 DIAGNOSIS — T451X5A Adverse effect of antineoplastic and immunosuppressive drugs, initial encounter: Secondary | ICD-10-CM | POA: Insufficient documentation

## 2023-09-12 DIAGNOSIS — C9002 Multiple myeloma in relapse: Secondary | ICD-10-CM | POA: Diagnosis present

## 2023-09-12 DIAGNOSIS — Z9481 Bone marrow transplant status: Secondary | ICD-10-CM | POA: Diagnosis present

## 2023-09-12 DIAGNOSIS — R5381 Other malaise: Secondary | ICD-10-CM | POA: Insufficient documentation

## 2023-09-12 DIAGNOSIS — R2681 Unsteadiness on feet: Secondary | ICD-10-CM | POA: Insufficient documentation

## 2023-09-12 DIAGNOSIS — G620 Drug-induced polyneuropathy: Secondary | ICD-10-CM | POA: Diagnosis present

## 2023-09-12 NOTE — Therapy (Signed)
 OUTPATIENT PHYSICAL THERAPY ONCOLOGY TREATMENT  Patient Name: AVELYNN SELLIN MRN: [id number] DOB:1946-05-21, 78 y.o., female Today's Date: 09/12/2023  END OF SESSION:  PT End of Session - 09/12/23 0955     Visit Number 12    Number of Visits 17    Date for PT Re-Evaluation 09/21/23    PT Start Time 1000    PT Stop Time 1050    PT Time Calculation (min) 50 min    Equipment Utilized During Treatment Gait belt    Activity Tolerance Patient tolerated treatment well    Behavior During Therapy WFL for tasks assessed/performed             Past Medical History:  Diagnosis Date   Anemia    Anxiety    Blood transfusion without reported diagnosis    Carotid stenosis    Mild bilateral   Carpal tunnel syndrome    Cataract    Cervical disc disease    c7   Cough 07/27/2015   Disc degeneration    Dysuria 07/26/2015   Failure of stem cell transplant (HCC)    Fatigue 01/26/2015   Fever 07/27/2015   GERD (gastroesophageal reflux disease)    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   Multiple myeloma (HCC)    Osteopenia    Pinched nerve    Sinusitis, bacterial 07/27/2015   Spinal stenosis in cervical region    Varicose veins of lower extremities with inflammation    Past Surgical History:  Procedure Laterality Date   ANKLE SURGERY Left    BLADDER SUSPENSION  1988/1989   x2   COLONOSCOPY     ESOPHAGOGASTRODUODENOSCOPY     IR IMAGING GUIDED PORT INSERTION  03/01/2018   LIMBAL STEM CELL TRANSPLANT     x2   PORT-A-CATH REMOVAL     SHOULDER SURGERY Right 04/06/2013   right   UPPER GASTROINTESTINAL ENDOSCOPY     Patient Active Problem List   Diagnosis Date Noted   Deficiency anemia 09/07/2023   Closed left ankle fracture 06/16/2022   Fluid overload 04/17/2022    Anemia due to antineoplastic chemotherapy 06/24/2021   Sinusitis 06/13/2021   Neutropenic fever (HCC) 04/05/2021   Hyperglycemia, drug-induced 07/29/2020   Financial difficulties 05/27/2020   Bilateral cataracts 12/04/2019   Headache disorder 09/11/2019   Weight gain 09/11/2019   Skin lesion of back 08/07/2019   Pelvic floor dysfunction in female 07/09/2019   Alopecia 07/09/2019   Squamous cell cancer of skin of left temple 06/04/2019   Exertional shortness of breath 02/14/2019   Vitamin B12 deficiency 09/25/2018   Large hiatal hernia 07/26/2018   Belching 07/26/2018   Early satiety 07/26/2018   URI (upper respiratory infection) 06/25/2018   Lung nodule seen on imaging study 06/25/2018   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/11/2018   Tinnitus of both ears 04/10/2018   Preventive measure 04/10/2018   Goals of care, counseling/discussion 02/20/2018   Pain of left scapula 12/06/2017   Vitamin D deficiency 11/12/2017   Yeast infection of the skin 09/06/2017   Family hx of colon cancer requiring screening colonoscopy 01/17/2017   Cystocele and rectocele with incomplete uterovaginal prolapse 11/22/2016   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   Bilateral pleural effusion 01/25/2016   Pericardial effusion 01/24/2016  Physical deconditioning 01/11/2016   S/P autologous bone marrow transplantation (HCC) 11/08/2015   Therapeutic drug monitoring 11/08/2015   Red blood cell antibody positive, compatible PRBC difficult to obtain 07/30/2015   Cough 07/27/2015   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   Fatigue 01/26/2015   Essential hypertension 01/26/2015   Other fatigue 01/26/2015   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/2015   Chronic recurrent sinusitis 10/23/2014   Conjunctivitis 10/23/2014   Status post allogeneic bone marrow transplant (HCC) 08/14/2014   Leukopenia due to antineoplastic chemotherapy (HCC) 08/10/2014   Patient  in clinical research study 07/21/2014   Need for prophylactic vaccination and inoculation against influenza 02/24/2014   Diarrhea 02/03/2014   Leukocytosis 02/03/2014   Constipation 11/21/2013   Rash 11/21/2013   Back pain 11/21/2013   Anxiety 11/21/2013   Multiple myeloma in relapse (HCC) 11/07/2013   Pancytopenia, acquired (HCC) 10/29/2013   Thrombocytopenia (HCC) 10/29/2013   Persistent cough for 3 weeks or longer 10/02/2013   Abnormal weight loss 10/02/2013   Normocytic anemia 08/22/2013   Abdominal bloating 08/22/2013   Hypersensitivity 08/07/2013   Other malaise and fatigue 08/07/2013   Osteopenia 08/05/2013   GERD (gastroesophageal reflux disease) 01/16/2013   Insomnia disorder 01/16/2013   Carotid stenosis    Cervical disc disease    Hypertension 04/15/2012   Palpitations 03/29/2012    PCP: Lorine Bears, MD  REFERRING PROVIDER: Artis Delay, MD  REFERRING DIAG: Deconditioning  THERAPY DIAG:  Multiple myeloma in relapse (HCC)  History of bone marrow transplant (HCC)  Chemotherapy-induced neuropathy (HCC)  Gait instability  Physical deconditioning  ONSET DATE: 1 year ago  Rationale for Evaluation and Treatment: Rehabilitation  SUBJECTIVE:                                                                                                                                                                                           SUBJECTIVE STATEMENT: I have cataract surgery tomorrow and the next one on 09/27/2023.I have some decisions to make about my Myeloma treatment at [facility]. I am on the last approved drug and since I had an allogenic transfer I don't qualify for clinical trials. I started back to my exercises last week.   EVAL I am so achy in my legs the longer I move or stand.. It improves if I move around. I don't feel sure footed due to neuropathy from chemo. I wobble when I walk.I want to do more. I want to be able to sit on the floor for my 37 year old  granddaughter. Balance is a concern. I can get up and down from chairs but I do it slowly and I  use my arms.  Right lateral thigh feels very tight and has been since my Left ankle fx. I have fallen and broken my wrist and ring finger  and have a right reverse TSA.(2014). I don't meet my 5,000 step goal. I Feel tight in both calves. I have trouble getting out of my car because of  leg (ITB) pain on right, and difficulty putting on my socks.  PERTINENT HISTORY:  Pt has a hx of Multiple Myeloma initially diagnosed in 2015. Underwent chemotherapy with several different ones due to side effects. 07/23/2014 underwent a Bone Marrow Transplant at [facility] She has had multiple bone marrow biopsies over the years and changes of chemotherapy. She received an allogenic stem cell transplant in April 2017. She is currently on high dose IVIG treatment.  Had a CAR-T in  [facility] April. She is very deconditioned. It is just in the marrow, and not in the bone. Fx left ankle last year and had ORIF in Nov. 23.  PAIN:  Are you having pain? NO,   PRECAUTIONS: Multiple Myeloma, macular degeneration, peripheral neuropathy, myelodisplastic syndrome,carotid stenosis(mild), Right reverse TSA, left ankle ORIF  WEIGHT BEARING RESTRICTIONS: No  FALLS:  Has patient fallen in last 6 months? No, but is at risk  LIVING ENVIRONMENT: Lives with: lives alone Lives in: House/apartment  OCCUPATION: retired Engineer, site  LEISURE: arts and Presenter, broadcasting,  HAND DOMINANCE: left   PRIOR LEVEL OF FUNCTION: Independent  PATIENT GOALS: Reduce achiness, improve confidence, improve balance,   OBJECTIVE: Note: Objective measures were completed at Evaluation unless otherwise noted.  COGNITION: Overall cognitive status: Within functional limits for tasks assessed   PALPATION: Very tender full length of bilateral ITB  OBSERVATIONS / OTHER ASSESSMENTS:    SENSATION: Light touch: deficits bilateral feet  POSTURE: forward head,  rounded shoulders  UPPER EXTREMITY AROM/PROM:  A/PROM RIGHT   eval   Shoulder extension   Shoulder flexion   Shoulder abduction   Shoulder internal rotation   Shoulder external rotation     (Blank rows = not tested)  A/PROM LEFT   eval  Shoulder extension   Shoulder flexion   Shoulder abduction   Shoulder internal rotation   Shoulder external rotation     (Blank rows = not tested)  CERVICAL AROM: NT      UPPER EXTREMITY STRENGTH:    LOWER EXTREMITY AROM/PROM:  MMT Right eval  Hip flexion 4+  Hip extension Can bridge  Hip abduction 4+/5 sit  Hip adduction 4+/5 sit  Hip internal rotation 4  Hip external rotation 4+  Knee flexion 5  Knee extension 4  Ankle dorsiflexion 5  Ankle plantarflexion 5/5  Ankle inversion 4+  Ankle eversion 4+   (Blank rows = not tested)  MMT LEFT eval  Hip flexion 4  Hip extension Can bridge  Hip abduction 4+ sit  Hip adduction 4+ sit  Hip internal rotation 4  Hip external rotation 4+  Knee flexion 5  Knee extension 4  Ankle dorsiflexion 4+  Ankle plantarflexion 4  Ankle inversion 4  Ankle eversion 3+   (Blank rows = not tested)  LOWER EXTREMITY FLEXIBILITY; HS Right 48, Left 53, Very tight ITB and piriformis B, Bilateral gastroc  FUNCTIONAL TESTS:  EVAL: 30 sec sit to stand:  8 using hands at knees (below avg for age)  4 position balance test able to maintain 10 sec, some sway with tandem stance, left greater than right SLS  Able to turn full circle in both directions  without LOB, able to stand with eyes closed 20 seconds feet slightly apart   TUG; 16 seconds, short steps, toe out, decreased heel strike.  GAIT: Distance walked: 40 Assistive device utilized: None Level of assistance: SBA Comments: short ,choppy steps, decreased heel strike, toe out   LOWER EXTREMITY FUNCTIONAL SCALE; 22   TODAY'S TREATMENT:                                                                                                                                          DATE:  09/12/2023 Nu Step seat 6, UE 8 lev 5 x 6 min to warm up, 431 steps Incline stretch at parallel bars x 3 x 20 sec Marching without HH x 20 Ax beam 8 lengths forward, 8 lengths sideways  Step and hold on ax x 10 forward and sideways Ambulated 701 ft and went up and down 4 stairs x 4 with emphasis on erect gait with head up and increased step length. Cable column scapular retraction and shoulder extension 2  x 10 Bridging x 10, 5 sec holds Ppt with purple ball squeeze x 10  LTR x 4 ea Ppt with low march 2 x 10  08/15/2023 Nu Step seat 6, UE 8, Lev 6  x 6 min to warm up, 398 steps Incline stretch at parallel bars x 3 x 20 sec Mini squats holding bar x5 emphasis on proper form then to high table without hand hold for proper form and to strengthen legs for stairs and sit to stand x 8. Held due to back/neck pain Tandem stance bilateral x 2 on ax pad to failure Ambulated 281 feet then stairs in ortho gym x 3 up and down and return to cancer gym.seated rest Ax beam x 6 lengths forward and 6 lengths sideways with intermittent HH Jobes flex and scaption against wall x 10 Cable retraction 3# ea 2x 10 Standing extension yellow 2 x 10 SL on ax pad x 4 ea to failure Ambulated 331 ft with emphasis on erect posture and longer step length Marching on ax x 20 Heel raises on ax x15 Vector reaches B x3 with toe touch, x 3 ea no touch 08/10/2023 Discussed progress towards goals for recert and remaining deficits TUG 2 trials 12 sec, 14 sec, avg 13 sec 30 second sit to stand:9 reps SLS  3 reps to failure B, then 20 sec on left, 16 on right after several trials; inconsistent Ambulated 318 ft to ortho gym and back and stopped at steps;4 steps up and down working on eccentric control  Ax beam x 6 lengths forward and sideways with intermittent hand touch for balance Postural theraband;B Scapular retraction x 15 red, B shoulder ext x 15, single arm ER ea x 10 with red   Purple balance pad;reviewed HEP;marching, heel raises, half tandem stance B x 2 ea, tandem stance B x 2 ea Vector  reaches x 5 with toe touch, tried without toe touch but was difficult    08/03/2023 Nu Step seat 6, UE 8, Lev 6  x 6 min to warm up, 397 steps Incline stretch at parallel bars x 3 x 20 sec Mini squats holding bar x5 emphasis on proper form then to high table without hand hold for proper form and to strengthen legs for stairs and sit to stand Tandem stance bilateral x 2 on ax pad to failure Vector reaches x 5 ea on floor,  DLS on ax x 30 sec SLS on ax x 3 ea to failure Ambulated 331 ft with stop at stairs;up and down 4 stairs  x 4 with rail with emphasis on eccentric control and increased step length with walking. Sit to stand 2 x 5 no hands emphasis on eccentric control 5 ea on oval disc to failure, hand touch prn Ambulated 318 ft with emphasis on increased step length  07/31/2023 Nu Step seat 6, UE 8, Lev 6  x 5 min to warm up, 333 steps Incline stretch at parallel bars x 3 x 20 sec Marching no hands 2 x 10 CGA on ax Heel raises with  x 15 no HH on ax Tandem stance x 2 ea on ax x 30 sec, 1-2 quick hand touches 2nd trial for each Vector reaches x 5 B Sit to stand x 5 Ambulated to ortho gym, 4 steps with reciprocal gait x 4 up and down and back to cancer gym with longer step lengths and better arm swing. Postural theraband;scapular retraction red x 10, bilateral extension red x 10, Bilateral ER yellow x 5, single arm yellow x 5 Sit to stand x 5 arms crossed Ambulated ortho gym and back with emphasis again on increased step length and arm swing. Airex beam 6 lengths forward with intermittent hand touch and 6 lengths sideways intermittent hand touch 07/26/2023 Nu Step seat 6, UE 8, Lev 5  x 7 min to warm up, 518 steps Incline stretch at parallel bars x 3 x 20 sec Marching no hands 2 x 10 CGA  Heel raises with  x 15 no HH on ax Vector reaches x 5 ea B with toe touch, x 3  without toe touch Ax beam x 6 lengths forward and 6 lengths sideways cGA Ambulated to ortho gym and went up and down 4 steps with railing and reciprocal gait, and ambulated back with emphasis on increasing step length Sit to stand from chair 10 with emphasis on head over feet and control Postural theraband;scapular retraction red x 10, bilateral extension red x 10, Bilateral ER with red x 5, yellow  x 5  07/23/2023 Nu Step seat 6, UE 8, Lev 5  x 6 min to warm up, 412 steps Incline stretch at parallel bars x 3 x 20 sec Marching no hands 2 x 10 CGA on ax Heel raises with HH x 10, x 10 no HH on ax Ax beam x 6 lengths forward and 6 lengths sideways cGA Step and hold on ax forward and sideways x 10 B with only occasional hand touch;CGA of PT Ambulated to ortho gym back wall and back, SBA with emphasis on heel strike/toe off, Stopped at stair case in ortho gym and using railing ambulated up and down 4 steps x 4 reps LT x 4 ea side Bilateral HS stretch with strap x 3 ea, 20 sec ea Rolling to bilateral ITB in SL 3 min ea 07/19/2023 Nu Step seat 6,  UE 8, Lev 5  x 5 min to warm up Incline stretch at parallel bars x 3 x 20 sec Marching no hands 2 x 10 CGA on ax Heel raises with HH x 10, x 10 no HH on ax Step up on ax x 10 forward B, x 10 sideways B with CGA Ax beam x 6 lengths forward and 6 lengths sideways 6 in step ups with HH x 10 ea Ambulated to ortho gym back wall and back, SBA with emphasis on heel strike/toe off, 303 ft 1st walk, 313 second walk Sit to stand x 10 with some use of hands on legs   07/16/2023 Nu Step seat 6, UE 8, Lev 5  x 5 min to warm up Incline stretch at parallel bars x 3 x 20 sec Marching no hands 2 x 10 CGA on ax Heel raises with HH x 10, x 10 no HH on ax Step up on ax x 10 forward B, x 10 sideways B with CGA Ambulated to ortho gym back wall and back, CGA with emphasis on heel strike/toe off Ax beam x 4 lengths ea; forward and sideways with CGA of PT Bridging x10,  ppt x 10 Ppt with march x 10 Ppt with ball squeeze between knees x 7 Bilateral ITB and hamstring stretches x 3 done by PT with 20 sec hold Updated HEP 07/09/2023 Nu Step seat 7, UE 8, Lev 5  x 5 minto warm up Incline stretch at parallel bars x 3 x 20 sec Marching no hands 2 x 10 CGA Heel raises with HH x 10, x 10 no HH Step up on ax x 10 forward B, x 10 sideways B with CGA Ambulated in cancer gym with CGA emphasis on heel strike, toe off and lengthening stride slightly Bilateral piriformis stretch x 3 x 20 sec Bilateral ITB rolling in SL  07/04/2022 Nu Step seat 7, UE 8, Lev 5 to warm up Piriformis stretch x 3 B supine x 20 sec HS stretch with strap x 3 B 30 sec Gastroc stretch with strap in sitting x 3 Bilateral ITB rolling x 4 min ea in sidelying Updated HEP with pics 06/15/2023 Discussed POC, visit number, treatment interventions. Will include aquatic therapy but not to start and only if she wants to. Set timer for 6O min and get up to move around  PATIENT EDUCATION:  Access Code: [email or web address] URL: [email or web address] Date: 07/16/2023 Prepared by: Alvira Monday  Exercises - Supine Bridge  - 1 x daily - 7 x weekly - 1 sets - 10 reps - Supine Pelvic Tilt  - 1 x daily - 7 x weekly - 1 sets - 10 reps - Supine Hip Adduction Isometric with Ball  - 1 x daily - 7 x weekly - 1 sets - 10 reps Access Code: [email or web address] URL: [email or web address] Date: 07/05/2023 Prepared by: Alvira Monday  Exercises - Supine Hamstring Stretch with Strap  - 1 x daily - 7 x weekly - 1 sets - 3 reps - 20-30 hold - Supine Hip External Rotation Stretch  - 1 x daily - 7 x weekly - 3 sets - 3 reps - 20-30 hold - Seated Calf Stretch with Strap  - 1 x daily - 7 x weekly - 1 sets - 3 reps - 20-30 hold - Supine ITB Stretch with Strap  - 1 x daily - 7 x weekly - 1 sets - 3 reps - 20 hold Education details: POC, visit number, treatment interventions. Will include aquatic therapy  but not to  start and only if she wants to Person educated: Patient Education method: Explanation Education comprehension: verbalized understanding  HOME EXERCISE PROGRAM: Set alarm for 60 min to get up and walk around house  ASSESSMENT:  Pt had not been seen in therapy for a month however, she did very well today ambulating 700 ft without stopping. She feels safer the way she is ambulating. She also demonstrated improved balance with activities in clinic.   OBJECTIVE IMPAIRMENTS: Abnormal gait, decreased activity tolerance, decreased balance, decreased strength, impaired flexibility, impaired sensation, postural dysfunction, and pain.   ACTIVITY LIMITATIONS: carrying, bending, standing, squatting, stairs, and locomotion level  PARTICIPATION LIMITATIONS: cleaning, shopping, and community activity  PERSONAL FACTORS: Age and 1-2 comorbidities: Multiple Myeloma and current treatments  are also affecting patient's functional outcome.   REHAB POTENTIAL: Good  CLINICAL DECISION MAKING: Stable/uncomplicated  EVALUATION COMPLEXITY: Low  GOALS: Goals reviewed with patient? Yes  SHORT TERM GOALS: Target date: 07/12/2022  Pt will be independent with HEP to improve flexibility and strength Baseline: Goal status: MET but not consistently due to fatigue 2.  Pt will perform TUG in 13 seconds to decrease fall risk Baseline:  Goal status: MET 08/10/2023 3.  Pt will be able to perform  9 sit to stands in 30 seconds for average rating for her age Baseline: 8 Goal status: MET 08/10/2023  4.  Pt will have decreased right ITB pain by 25% Baseline:  Goal status: MET 08/10/2023 5.  Pt will ambulate with improved stride length and heel strike for more normal gait Baseline Goal status: MET 08/03/2023  LONG TERM GOALS: Target date: 08/09/2022  Pt will be able to don socks with improved ease Baseline:  Goal status: MET 08/03/2023  2.  Pt will have decreased right ITB pain by 50% or greater Baseline:  Goal  status: MET 08/10/2023  3.  Pt will be able to get out of her car without right ITB pain Baseline:  Goal status: MET 07/15/2022  4.  LEFS will be increased to 40 to demonstrate improved function Baseline:  Goal status: MET 08/10/2023 (41)  5.  Pt will perform TUG in 11 seconds  or less to demonstrate decreased fall risk Baseline:  Goal status: In progress (13 today)  6.  Pt will perform SLS for 14 seconds on each side to achieve age related norms Baseline:  Goal status: In progress, inconsistent able to do multiple shorter   7.  Pt will return to riding the bike at the gym for atleast 10 min 2x's per week in addition to her HEP.  Baseline;not going  Goal Status; NEW PLAN:  PT FREQUENCY: 2x/week for 1 week, then 1x/week for 5 weeks as pt transitions to gym program  PT DURATION: 8 weeks  PLANNED INTERVENTIONS: 97164- PT Re-evaluation, 97110-Therapeutic exercises, 97530- Therapeutic activity, 97112- Neuromuscular re-education, 97535- Self Care, 86578- Manual therapy, [phone]- Gait training, and [phone]- Aquatic Therapy  PLAN FOR NEXT SESSION: mat exercises; bridge, march etc. add UE strength, ITB stretches;already gave pics, rolling to bilateral ITB prn, strength, balance; HEP; mini squats, sit to stand, stretches etc   Waynette Buttery, PT 09/12/2023, 10:55 AM

## 2023-09-17 ENCOUNTER — Ambulatory Visit: Payer: Medicare HMO

## 2023-09-17 DIAGNOSIS — C9002 Multiple myeloma in relapse: Secondary | ICD-10-CM

## 2023-09-17 DIAGNOSIS — Z9481 Bone marrow transplant status: Secondary | ICD-10-CM

## 2023-09-17 DIAGNOSIS — G620 Drug-induced polyneuropathy: Secondary | ICD-10-CM

## 2023-09-17 DIAGNOSIS — R2681 Unsteadiness on feet: Secondary | ICD-10-CM

## 2023-09-17 DIAGNOSIS — R5381 Other malaise: Secondary | ICD-10-CM

## 2023-09-17 NOTE — Therapy (Signed)
 OUTPATIENT PHYSICAL THERAPY ONCOLOGY TREATMENT  Patient Name: Jean Davidson MRN: [id number] DOB:06/08/1946, 78 y.o., female Today's Date: 09/17/2023  END OF SESSION:  PT End of Session - 09/17/23 0947     Visit Number 13    Number of Visits 17    Date for PT Re-Evaluation 09/21/23    PT Start Time 1000    PT Stop Time 1056    PT Time Calculation (min) 56 min    Activity Tolerance Patient tolerated treatment well    Behavior During Therapy [facility] for tasks assessed/performed             Past Medical History:  Diagnosis Date   Anemia    Anxiety    Blood transfusion without reported diagnosis    Carotid stenosis    Mild bilateral   Carpal tunnel syndrome    Cataract    Cervical disc disease    c7   Cough 07/27/2015   Disc degeneration    Dysuria 07/26/2015   Failure of stem cell transplant (HCC)    Fatigue 01/26/2015   Fever 07/27/2015   GERD (gastroesophageal reflux disease)    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   Multiple myeloma (HCC)    Osteopenia    Pinched nerve    Sinusitis, bacterial 07/27/2015   Spinal stenosis in cervical region    Varicose veins of lower extremities with inflammation    Past Surgical History:  Procedure Laterality Date   ANKLE SURGERY Left    BLADDER SUSPENSION  1988/1989   x2   COLONOSCOPY     ESOPHAGOGASTRODUODENOSCOPY     IR IMAGING GUIDED PORT INSERTION  03/01/2018   LIMBAL STEM CELL TRANSPLANT     x2   PORT-A-CATH REMOVAL     SHOULDER SURGERY Right 04/06/2013   right   UPPER GASTROINTESTINAL ENDOSCOPY     Patient Active Problem List   Diagnosis Date Noted   Deficiency anemia 09/07/2023   Closed left ankle fracture 06/16/2022   Fluid overload 04/17/2022   Anemia due to antineoplastic chemotherapy  06/24/2021   Sinusitis 06/13/2021   Neutropenic fever (HCC) 04/05/2021   Hyperglycemia, drug-induced 07/29/2020   Financial difficulties 05/27/2020   Bilateral cataracts 12/04/2019   Headache disorder 09/11/2019   Weight gain 09/11/2019   Skin lesion of back 08/07/2019   Pelvic floor dysfunction in female 07/09/2019   Alopecia 07/09/2019   Squamous cell cancer of skin of left temple 06/04/2019   Exertional shortness of breath 02/14/2019   Vitamin B12 deficiency 09/25/2018   Large hiatal hernia 07/26/2018   Belching 07/26/2018   Early satiety 07/26/2018   URI (upper respiratory infection) 06/25/2018   Lung nodule seen on imaging study 06/25/2018   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/11/2018   Tinnitus of both ears 04/10/2018   Preventive measure 04/10/2018   Goals of care, counseling/discussion 02/20/2018   Pain of left scapula 12/06/2017   Vitamin D deficiency 11/12/2017   Yeast infection of the skin 09/06/2017   Family hx of colon cancer requiring screening colonoscopy 01/17/2017   Cystocele and rectocele with incomplete uterovaginal prolapse 11/22/2016   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   Bilateral pleural effusion 01/25/2016   Pericardial effusion 01/24/2016   Physical deconditioning 01/11/2016   S/P autologous  bone marrow transplantation (HCC) 11/08/2015   Therapeutic drug monitoring 11/08/2015   Red blood cell antibody positive, compatible PRBC difficult to obtain 07/30/2015   Cough 07/27/2015   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   Fatigue 01/26/2015   Essential hypertension 01/26/2015   Other fatigue 01/26/2015   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/2015   Chronic recurrent sinusitis 10/23/2014   Conjunctivitis 10/23/2014   Status post allogeneic bone marrow transplant (HCC) 08/14/2014   Leukopenia due to antineoplastic chemotherapy (HCC) 08/10/2014   Patient in clinical research study 07/21/2014    Need for prophylactic vaccination and inoculation against influenza 02/24/2014   Diarrhea 02/03/2014   Leukocytosis 02/03/2014   Constipation 11/21/2013   Rash 11/21/2013   Back pain 11/21/2013   Anxiety 11/21/2013   Multiple myeloma in relapse (HCC) 11/07/2013   Pancytopenia, acquired (HCC) 10/29/2013   Thrombocytopenia (HCC) 10/29/2013   Persistent cough for 3 weeks or longer 10/02/2013   Abnormal weight loss 10/02/2013   Normocytic anemia 08/22/2013   Abdominal bloating 08/22/2013   Hypersensitivity 08/07/2013   Other malaise and fatigue 08/07/2013   Osteopenia 08/05/2013   GERD (gastroesophageal reflux disease) 01/16/2013   Insomnia disorder 01/16/2013   Carotid stenosis    Cervical disc disease    Hypertension 04/15/2012   Palpitations 03/29/2012    PCP: Lorine Bears, MD  REFERRING PROVIDER: Artis Delay, MD  REFERRING DIAG: Deconditioning  THERAPY DIAG:  Multiple myeloma in relapse (HCC)  History of bone marrow transplant (HCC)  Chemotherapy-induced neuropathy (HCC)  Gait instability  Physical deconditioning  ONSET DATE: 1 year ago  Rationale for Evaluation and Treatment: Rehabilitation  SUBJECTIVE:                                                                                                                                                                                           SUBJECTIVE STATEMENT: My cataract surgery went well and I had my check up already today. Did well after last visit. I have been doing my HEP.   EVAL I am so achy in my legs the longer I move or stand.. It improves if I move around. I don't feel sure footed due to neuropathy from chemo. I wobble when I walk.I want to do more. I want to be able to sit on the floor for my 48 year old granddaughter. Balance is a concern. I can get up and down from chairs but I do it slowly and I use my arms.  Right lateral thigh feels very tight and has been since my Left ankle fx. I have fallen and  broken my wrist and ring finger  and have a right  reverse TSA.(2014). I don't meet my 5,000 step goal. I Feel tight in both calves. I have trouble getting out of my car because of  leg (ITB) pain on right, and difficulty putting on my socks.  PERTINENT HISTORY:  Pt has a hx of Multiple Myeloma initially diagnosed in 2015. Underwent chemotherapy with several different ones due to side effects. 07/23/2014 underwent a Bone Marrow Transplant at [facility] She has had multiple bone marrow biopsies over the years and changes of chemotherapy. She received an allogenic stem cell transplant in April 2017. She is currently on high dose IVIG treatment.  Had a CAR-T in  [facility] April. She is very deconditioned. It is just in the marrow, and not in the bone. Fx left ankle last year and had ORIF in Nov. 23.  PAIN:  Are you having pain? NO,   PRECAUTIONS: Multiple Myeloma, macular degeneration, peripheral neuropathy, myelodisplastic syndrome,carotid stenosis(mild), Right reverse TSA, left ankle ORIF  WEIGHT BEARING RESTRICTIONS: No  FALLS:  Has patient fallen in last 6 months? No, but is at risk  LIVING ENVIRONMENT: Lives with: lives alone Lives in: House/apartment  OCCUPATION: retired Engineer, site  LEISURE: arts and Presenter, broadcasting,  HAND DOMINANCE: left   PRIOR LEVEL OF FUNCTION: Independent  PATIENT GOALS: Reduce achiness, improve confidence, improve balance,   OBJECTIVE: Note: Objective measures were completed at Evaluation unless otherwise noted.  COGNITION: Overall cognitive status: Within functional limits for tasks assessed   PALPATION: Very tender full length of bilateral ITB  OBSERVATIONS / OTHER ASSESSMENTS:    SENSATION: Light touch: deficits bilateral feet  POSTURE: forward head, rounded shoulders  UPPER EXTREMITY AROM/PROM:  A/PROM RIGHT   eval   Shoulder extension   Shoulder flexion   Shoulder abduction   Shoulder internal rotation   Shoulder external rotation      (Blank rows = not tested)  A/PROM LEFT   eval  Shoulder extension   Shoulder flexion   Shoulder abduction   Shoulder internal rotation   Shoulder external rotation     (Blank rows = not tested)  CERVICAL AROM: NT      UPPER EXTREMITY STRENGTH:    LOWER EXTREMITY AROM/PROM:  MMT Right eval  Hip flexion 4+  Hip extension Can bridge  Hip abduction 4+/5 sit  Hip adduction 4+/5 sit  Hip internal rotation 4  Hip external rotation 4+  Knee flexion 5  Knee extension 4  Ankle dorsiflexion 5  Ankle plantarflexion 5/5  Ankle inversion 4+  Ankle eversion 4+   (Blank rows = not tested)  MMT LEFT eval  Hip flexion 4  Hip extension Can bridge  Hip abduction 4+ sit  Hip adduction 4+ sit  Hip internal rotation 4  Hip external rotation 4+  Knee flexion 5  Knee extension 4  Ankle dorsiflexion 4+  Ankle plantarflexion 4  Ankle inversion 4  Ankle eversion 3+   (Blank rows = not tested)  LOWER EXTREMITY FLEXIBILITY; HS Right 48, Left 53, Very tight ITB and piriformis B, Bilateral gastroc  FUNCTIONAL TESTS:  EVAL: 30 sec sit to stand:  8 using hands at knees (below avg for age)  4 position balance test able to maintain 10 sec, some sway with tandem stance, left greater than right SLS  Able to turn full circle in both directions without LOB, able to stand with eyes closed 20 seconds feet slightly apart   TUG; 16 seconds, short steps, toe out, decreased heel strike.  GAIT: Distance walked: 40 Assistive device utilized:  None Level of assistance: SBA Comments: short ,choppy steps, decreased heel strike, toe out   LOWER EXTREMITY FUNCTIONAL SCALE; 22   TODAY'S TREATMENT:                                                                                                                                         DATE:   09/17/2023 all exercises with Supervision to light CGA Nu Step seat 6, UE 8 lev 5 x 6 min to warm up, 427 steps Incline stretch 3 x 30 sec Ax beam 8  lengths forward, 8 lengths sideways; improved control  Step and hold on ax x 10 forward and sideways Tandem stance x 2 ea to failure Vector reaches with touch x 5 ea, without touch x 5 SLS x 3 ea to failure Ambulated 650 feet and went up and down 4 steps x 4 with 1 rail. Emphasis on posture and gait. Scapular retraction, shoulder ext, bilateral ER red band x 10, then x 5 reps Ppt with ball squeeze x 10 Bridgingx 10 Ppt with marching Ppt with red clamshell x 10 09/12/2023 Nu Step seat 6, UE 8 lev 5 x 6 min to warm up, 431 steps Incline stretch at parallel bars x 3 x 20 sec Marching without HH x 20 Ax beam 8 lengths forward, 8 lengths sideways  Step and hold on ax x 10 forward and sideways Ambulated 701 ft and went up and down 4 stairs x 4 with emphasis on erect gait with head up and increased step length. Cable column scapular retraction and shoulder extension 2  x 10 Bridging x 10, 5 sec holds Ppt with purple ball squeeze x 10  LTR x 4 ea Ppt with low march 2 x 10  08/15/2023 Nu Step seat 6, UE 8, Lev 6  x 6 min to warm up, 398 steps Incline stretch at parallel bars x 3 x 20 sec Mini squats holding bar x5 emphasis on proper form then to high table without hand hold for proper form and to strengthen legs for stairs and sit to stand x 8. Held due to back/neck pain Tandem stance bilateral x 2 on ax pad to failure Ambulated 281 feet then stairs in ortho gym x 3 up and down and return to cancer gym.seated rest Ax beam x 6 lengths forward and 6 lengths sideways with intermittent HH Jobes flex and scaption against wall x 10 Cable retraction 3# ea 2x 10 Standing extension yellow 2 x 10 SL on ax pad x 4 ea to failure Ambulated 331 ft with emphasis on erect posture and longer step length Marching on ax x 20 Heel raises on ax x15 Vector reaches B x3 with toe touch, x 3 ea no touch 08/10/2023 Discussed progress towards goals for recert and remaining deficits TUG 2 trials 12 sec, 14 sec, avg  13 sec 30 second sit to stand:9 reps SLS  3 reps to failure B, then 20 sec on left, 16 on right after several trials; inconsistent Ambulated 318 ft to ortho gym and back and stopped at steps;4 steps up and down working on eccentric control  Ax beam x 6 lengths forward and sideways with intermittent hand touch for balance Postural theraband;B Scapular retraction x 15 red, B shoulder ext x 15, single arm ER ea x 10 with red  Purple balance pad;reviewed HEP;marching, heel raises, half tandem stance B x 2 ea, tandem stance B x 2 ea Vector reaches x 5 with toe touch, tried without toe touch but was difficult    08/03/2023 Nu Step seat 6, UE 8, Lev 6  x 6 min to warm up, 397 steps Incline stretch at parallel bars x 3 x 20 sec Mini squats holding bar x5 emphasis on proper form then to high table without hand hold for proper form and to strengthen legs for stairs and sit to stand Tandem stance bilateral x 2 on ax pad to failure Vector reaches x 5 ea on floor,  DLS on ax x 30 sec SLS on ax x 3 ea to failure Ambulated 331 ft with stop at stairs;up and down 4 stairs  x 4 with rail with emphasis on eccentric control and increased step length with walking. Sit to stand 2 x 5 no hands emphasis on eccentric control 5 ea on oval disc to failure, hand touch prn Ambulated 318 ft with emphasis on increased step length  07/31/2023 Nu Step seat 6, UE 8, Lev 6  x 5 min to warm up, 333 steps Incline stretch at parallel bars x 3 x 20 sec Marching no hands 2 x 10 CGA on ax Heel raises with  x 15 no HH on ax Tandem stance x 2 ea on ax x 30 sec, 1-2 quick hand touches 2nd trial for each Vector reaches x 5 B Sit to stand x 5 Ambulated to ortho gym, 4 steps with reciprocal gait x 4 up and down and back to cancer gym with longer step lengths and better arm swing. Postural theraband;scapular retraction red x 10, bilateral extension red x 10, Bilateral ER yellow x 5, single arm yellow x 5 Sit to stand x 5 arms  crossed Ambulated ortho gym and back with emphasis again on increased step length and arm swing. Airex beam 6 lengths forward with intermittent hand touch and 6 lengths sideways intermittent hand touch 07/26/2023 Nu Step seat 6, UE 8, Lev 5  x 7 min to warm up, 518 steps Incline stretch at parallel bars x 3 x 20 sec Marching no hands 2 x 10 CGA  Heel raises with  x 15 no HH on ax Vector reaches x 5 ea B with toe touch, x 3 without toe touch Ax beam x 6 lengths forward and 6 lengths sideways cGA Ambulated to ortho gym and went up and down 4 steps with railing and reciprocal gait, and ambulated back with emphasis on increasing step length Sit to stand from chair 10 with emphasis on head over feet and control Postural theraband;scapular retraction red x 10, bilateral extension red x 10, Bilateral ER with red x 5, yellow  x 5  07/23/2023 Nu Step seat 6, UE 8, Lev 5  x 6 min to warm up, 412 steps Incline stretch at parallel bars x 3 x 20 sec Marching no hands 2 x 10 CGA on ax Heel raises with HH x 10, x 10 no HH on ax  Ax beam x 6 lengths forward and 6 lengths sideways cGA Step and hold on ax forward and sideways x 10 B with only occasional hand touch;CGA of PT Ambulated to ortho gym back wall and back, SBA with emphasis on heel strike/toe off, Stopped at stair case in ortho gym and using railing ambulated up and down 4 steps x 4 reps LT x 4 ea side Bilateral HS stretch with strap x 3 ea, 20 sec ea Rolling to bilateral ITB in SL 3 min ea 07/19/2023 Nu Step seat 6, UE 8, Lev 5  x 5 min to warm up Incline stretch at parallel bars x 3 x 20 sec Marching no hands 2 x 10 CGA on ax Heel raises with HH x 10, x 10 no HH on ax Step up on ax x 10 forward B, x 10 sideways B with CGA Ax beam x 6 lengths forward and 6 lengths sideways 6 in step ups with HH x 10 ea Ambulated to ortho gym back wall and back, SBA with emphasis on heel strike/toe off, 303 ft 1st walk, 313 second walk Sit to stand x 10 with  some use of hands on legs   07/16/2023 Nu Step seat 6, UE 8, Lev 5  x 5 min to warm up Incline stretch at parallel bars x 3 x 20 sec Marching no hands 2 x 10 CGA on ax Heel raises with HH x 10, x 10 no HH on ax Step up on ax x 10 forward B, x 10 sideways B with CGA Ambulated to ortho gym back wall and back, CGA with emphasis on heel strike/toe off Ax beam x 4 lengths ea; forward and sideways with CGA of PT Bridging x10, ppt x 10 Ppt with march x 10 Ppt with ball squeeze between knees x 7 Bilateral ITB and hamstring stretches x 3 done by PT with 20 sec hold Updated HEP 07/09/2023 Nu Step seat 7, UE 8, Lev 5  x 5 minto warm up Incline stretch at parallel bars x 3 x 20 sec Marching no hands 2 x 10 CGA Heel raises with HH x 10, x 10 no HH Step up on ax x 10 forward B, x 10 sideways B with CGA Ambulated in cancer gym with CGA emphasis on heel strike, toe off and lengthening stride slightly Bilateral piriformis stretch x 3 x 20 sec Bilateral ITB rolling in SL  07/04/2022 Nu Step seat 7, UE 8, Lev 5 to warm up Piriformis stretch x 3 B supine x 20 sec HS stretch with strap x 3 B 30 sec Gastroc stretch with strap in sitting x 3 Bilateral ITB rolling x 4 min ea in sidelying Updated HEP with pics 06/15/2023 Discussed POC, visit number, treatment interventions. Will include aquatic therapy but not to start and only if she wants to. Set timer for 6O min and get up to move around  PATIENT EDUCATION:  Access Code: [email or web address] URL: [email or web address] Date: 07/16/2023 Prepared by: Alvira Monday  Exercises - Supine Bridge  - 1 x daily - 7 x weekly - 1 sets - 10 reps - Supine Pelvic Tilt  - 1 x daily - 7 x weekly - 1 sets - 10 reps - Supine Hip Adduction Isometric with Ball  - 1 x daily - 7 x weekly - 1 sets - 10 reps Access Code: [email or web address] URL: [email or web address] Date: 07/05/2023 Prepared by: Alvira Monday  Exercises - Supine Hamstring Stretch with Strap  -  1 x daily - 7  x weekly - 1 sets - 3 reps - 20-30 hold - Supine Hip External Rotation Stretch  - 1 x daily - 7 x weekly - 3 sets - 3 reps - 20-30 hold - Seated Calf Stretch with Strap  - 1 x daily - 7 x weekly - 1 sets - 3 reps - 20-30 hold - Supine ITB Stretch with Strap  - 1 x daily - 7 x weekly - 1 sets - 3 reps - 20 hold Education details: POC, visit number, treatment interventions. Will include aquatic therapy but not to start and only if she wants to Person educated: Patient Education method: Explanation Education comprehension: verbalized understanding  HOME EXERCISE PROGRAM: Set alarm for 60 min to get up and walk around house  ASSESSMENT:  Pt continues to demonstrate good improvement with gait and balance.   OBJECTIVE IMPAIRMENTS: Abnormal gait, decreased activity tolerance, decreased balance, decreased strength, impaired flexibility, impaired sensation, postural dysfunction, and pain.   ACTIVITY LIMITATIONS: carrying, bending, standing, squatting, stairs, and locomotion level  PARTICIPATION LIMITATIONS: cleaning, shopping, and community activity  PERSONAL FACTORS: Age and 1-2 comorbidities: Multiple Myeloma and current treatments  are also affecting patient's functional outcome.   REHAB POTENTIAL: Good  CLINICAL DECISION MAKING: Stable/uncomplicated  EVALUATION COMPLEXITY: Low  GOALS: Goals reviewed with patient? Yes  SHORT TERM GOALS: Target date: 07/12/2022  Pt will be independent with HEP to improve flexibility and strength Baseline: Goal status: MET but not consistently due to fatigue 2.  Pt will perform TUG in 13 seconds to decrease fall risk Baseline:  Goal status: MET 08/10/2023 3.  Pt will be able to perform  9 sit to stands in 30 seconds for average rating for her age Baseline: 8 Goal status: MET 08/10/2023  4.  Pt will have decreased right ITB pain by 25% Baseline:  Goal status: MET 08/10/2023 5.  Pt will ambulate with improved stride length and heel  strike for more normal gait Baseline Goal status: MET 08/03/2023  LONG TERM GOALS: Target date: 08/09/2022  Pt will be able to don socks with improved ease Baseline:  Goal status: MET 08/03/2023  2.  Pt will have decreased right ITB pain by 50% or greater Baseline:  Goal status: MET 08/10/2023  3.  Pt will be able to get out of her car without right ITB pain Baseline:  Goal status: MET 07/15/2022  4.  LEFS will be increased to 40 to demonstrate improved function Baseline:  Goal status: MET 08/10/2023 (41)  5.  Pt will perform TUG in 11 seconds  or less to demonstrate decreased fall risk Baseline:  Goal status: In progress (13 today)  6.  Pt will perform SLS for 14 seconds on each side to achieve age related norms Baseline:  Goal status: In progress, inconsistent able to do multiple shorter   7.  Pt will return to riding the bike at the gym for atleast 10 min 2x's per week in addition to her HEP.  Baseline;not going  Goal Status; NEW PLAN:  PT FREQUENCY: 2x/week for 1 week, then 1x/week for 5 weeks as pt transitions to gym program  PT DURATION: 8 weeks  PLANNED INTERVENTIONS: 97164- PT Re-evaluation, 97110-Therapeutic exercises, 97530- Therapeutic activity, 97112- Neuromuscular re-education, 97535- Self Care, 16109- Manual therapy, [id number]- Gait training, and [id number]- Aquatic Therapy  PLAN FOR NEXT SESSION: Recert/DC next, add UE strength, ITB stretches;already gave pics, rolling to bilateral ITB prn, strength, balance; HEP; mini squats, sit to stand, stretches etc   Kathryne Hitch  Isaiah Torok, PT 09/17/2023, 10:58 AM

## 2023-09-18 ENCOUNTER — Telehealth: Payer: Self-pay

## 2023-09-18 ENCOUNTER — Other Ambulatory Visit: Payer: Self-pay

## 2023-09-18 DIAGNOSIS — C9002 Multiple myeloma in relapse: Secondary | ICD-10-CM

## 2023-09-18 NOTE — Telephone Encounter (Signed)
 She called back and prefers lab appt on 4/1. Appt moved to 4/1 and she will see appt on mychart.

## 2023-09-18 NOTE — Telephone Encounter (Signed)
 Returned her call. She is needing to change lab appt to 4/1 or 4/2. Left a message asking her to call the office back. Per Dr. Bertis Ruddy okay to add T-cell helper lab.

## 2023-09-24 ENCOUNTER — Ambulatory Visit: Payer: Medicare HMO

## 2023-09-24 DIAGNOSIS — R2681 Unsteadiness on feet: Secondary | ICD-10-CM

## 2023-09-24 DIAGNOSIS — R5381 Other malaise: Secondary | ICD-10-CM

## 2023-09-24 DIAGNOSIS — C9002 Multiple myeloma in relapse: Secondary | ICD-10-CM

## 2023-09-24 DIAGNOSIS — Z9481 Bone marrow transplant status: Secondary | ICD-10-CM

## 2023-09-24 DIAGNOSIS — G620 Drug-induced polyneuropathy: Secondary | ICD-10-CM

## 2023-09-24 NOTE — Therapy (Signed)
 OUTPATIENT PHYSICAL THERAPY ONCOLOGY TREATMENT  Patient Name: Jean Davidson MRN: [id number] DOB:1945/12/17, 78 y.o., female Today's Date: 09/24/2023  END OF SESSION:  PT End of Session - 09/24/23 1004     Visit Number 14    Number of Visits 17    Date for PT Re-Evaluation 09/24/23    PT Start Time 1002    PT Stop Time 1051    PT Time Calculation (min) 49 min    Equipment Utilized During Treatment Gait belt    Activity Tolerance Patient tolerated treatment well    Behavior During Therapy WFL for tasks assessed/performed             Past Medical History:  Diagnosis Date   Anemia    Anxiety    Blood transfusion without reported diagnosis    Carotid stenosis    Mild bilateral   Carpal tunnel syndrome    Cataract    Cervical disc disease    c7   Cough 07/27/2015   Disc degeneration    Dysuria 07/26/2015   Failure of stem cell transplant (HCC)    Fatigue 01/26/2015   Fever 07/27/2015   GERD (gastroesophageal reflux disease)    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   Multiple myeloma (HCC)    Osteopenia    Pinched nerve    Sinusitis, bacterial 07/27/2015   Spinal stenosis in cervical region    Varicose veins of lower extremities with inflammation    Past Surgical History:  Procedure Laterality Date   ANKLE SURGERY Left    BLADDER SUSPENSION  1988/1989   x2   COLONOSCOPY     ESOPHAGOGASTRODUODENOSCOPY     IR IMAGING GUIDED PORT INSERTION  03/01/2018   LIMBAL STEM CELL TRANSPLANT     x2   PORT-A-CATH REMOVAL     SHOULDER SURGERY Right 04/06/2013   right   UPPER GASTROINTESTINAL ENDOSCOPY     Patient Active Problem List   Diagnosis Date Noted   Deficiency anemia 09/07/2023   Closed left ankle fracture 06/16/2022   Fluid overload 04/17/2022    Anemia due to antineoplastic chemotherapy 06/24/2021   Sinusitis 06/13/2021   Neutropenic fever (HCC) 04/05/2021   Hyperglycemia, drug-induced 07/29/2020   Financial difficulties 05/27/2020   Bilateral cataracts 12/04/2019   Headache disorder 09/11/2019   Weight gain 09/11/2019   Skin lesion of back 08/07/2019   Pelvic floor dysfunction in female 07/09/2019   Alopecia 07/09/2019   Squamous cell cancer of skin of left temple 06/04/2019   Exertional shortness of breath 02/14/2019   Vitamin B12 deficiency 09/25/2018   Large hiatal hernia 07/26/2018   Belching 07/26/2018   Early satiety 07/26/2018   URI (upper respiratory infection) 06/25/2018   Lung nodule seen on imaging study 06/25/2018   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/11/2018   Tinnitus of both ears 04/10/2018   Preventive measure 04/10/2018   Goals of care, counseling/discussion 02/20/2018   Pain of left scapula 12/06/2017   Vitamin D deficiency 11/12/2017   Yeast infection of the skin 09/06/2017   Family hx of colon cancer requiring screening colonoscopy 01/17/2017   Cystocele and rectocele with incomplete uterovaginal prolapse 11/22/2016   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   Bilateral pleural effusion 01/25/2016   Pericardial effusion 01/24/2016  Physical deconditioning 01/11/2016   S/P autologous bone marrow transplantation (HCC) 11/08/2015   Therapeutic drug monitoring 11/08/2015   Red blood cell antibody positive, compatible PRBC difficult to obtain 07/30/2015   Cough 07/27/2015   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   Fatigue 01/26/2015   Essential hypertension 01/26/2015   Other fatigue 01/26/2015   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/2015   Chronic recurrent sinusitis 10/23/2014   Conjunctivitis 10/23/2014   Status post allogeneic bone marrow transplant (HCC) 08/14/2014   Leukopenia due to antineoplastic chemotherapy (HCC) 08/10/2014   Patient  in clinical research study 07/21/2014   Need for prophylactic vaccination and inoculation against influenza 02/24/2014   Diarrhea 02/03/2014   Leukocytosis 02/03/2014   Constipation 11/21/2013   Rash 11/21/2013   Back pain 11/21/2013   Anxiety 11/21/2013   Multiple myeloma in relapse (HCC) 11/07/2013   Pancytopenia, acquired (HCC) 10/29/2013   Thrombocytopenia (HCC) 10/29/2013   Persistent cough for 3 weeks or longer 10/02/2013   Abnormal weight loss 10/02/2013   Normocytic anemia 08/22/2013   Abdominal bloating 08/22/2013   Hypersensitivity 08/07/2013   Other malaise and fatigue 08/07/2013   Osteopenia 08/05/2013   GERD (gastroesophageal reflux disease) 01/16/2013   Insomnia disorder 01/16/2013   Carotid stenosis    Cervical disc disease    Hypertension 04/15/2012   Palpitations 03/29/2012    PCP: Lorine Bears, MD  REFERRING PROVIDER: Artis Delay, MD  REFERRING DIAG: Deconditioning  THERAPY DIAG:  Multiple myeloma in relapse (HCC)  History of bone marrow transplant (HCC)  Chemotherapy-induced neuropathy (HCC)  Gait instability  Physical deconditioning  ONSET DATE: 1 year ago  Rationale for Evaluation and Treatment: Rehabilitation  SUBJECTIVE:                                                                                                                                                                                           SUBJECTIVE STATEMENT: Today is pts last visit here. She has decided on the BITE treatment at [facility] for her Multiple Myeloma.( Biospecific T cell Engager. I have been doing my home exercises but I have not been going to the gym.   EVAL I am so achy in my legs the longer I move or stand.. It improves if I move around. I don't feel sure footed due to neuropathy from chemo. I wobble when I walk.I want to do more. I want to be able to sit on the floor for my 38 year old granddaughter. Balance is a concern. I can get up and down from chairs but I do  it slowly and I use my arms.  Right lateral thigh feels very tight and  has been since my Left ankle fx. I have fallen and broken my wrist and ring finger  and have a right reverse TSA.(2014). I don't meet my 5,000 step goal. I Feel tight in both calves. I have trouble getting out of my car because of  leg (ITB) pain on right, and difficulty putting on my socks.  PERTINENT HISTORY:  Pt has a hx of Multiple Myeloma initially diagnosed in 2015. Underwent chemotherapy with several different ones due to side effects. 07/23/2014 underwent a Bone Marrow Transplant at [facility] She has had multiple bone marrow biopsies over the years and changes of chemotherapy. She received an allogenic stem cell transplant in April 2017. She is currently on high dose IVIG treatment.  Had a CAR-T in  [facility] April. She is very deconditioned. It is just in the marrow, and not in the bone. Fx left ankle last year and had ORIF in Nov. 23.  PAIN:  Are you having pain? NO,   PRECAUTIONS: Multiple Myeloma, macular degeneration, peripheral neuropathy, myelodisplastic syndrome,carotid stenosis(mild), Right reverse TSA, left ankle ORIF  WEIGHT BEARING RESTRICTIONS: No  FALLS:  Has patient fallen in last 6 months? No, but is at risk  LIVING ENVIRONMENT: Lives with: lives alone Lives in: House/apartment  OCCUPATION: retired Engineer, site  LEISURE: arts and Presenter, broadcasting,  HAND DOMINANCE: left   PRIOR LEVEL OF FUNCTION: Independent  PATIENT GOALS: Reduce achiness, improve confidence, improve balance,   OBJECTIVE: Note: Objective measures were completed at Evaluation unless otherwise noted.  COGNITION: Overall cognitive status: Within functional limits for tasks assessed   PALPATION: Very tender full length of bilateral ITB  OBSERVATIONS / OTHER ASSESSMENTS:    SENSATION: Light touch: deficits bilateral feet  POSTURE: forward head, rounded shoulders  UPPER EXTREMITY AROM/PROM:  A/PROM RIGHT   eval   Shoulder  extension   Shoulder flexion   Shoulder abduction   Shoulder internal rotation   Shoulder external rotation     (Blank rows = not tested)  A/PROM LEFT   eval  Shoulder extension   Shoulder flexion   Shoulder abduction   Shoulder internal rotation   Shoulder external rotation     (Blank rows = not tested)  CERVICAL AROM: NT      UPPER EXTREMITY STRENGTH:    LOWER EXTREMITY AROM/PROM:  MMT Right eval  Hip flexion 4+  Hip extension Can bridge  Hip abduction 4+/5 sit  Hip adduction 4+/5 sit  Hip internal rotation 4  Hip external rotation 4+  Knee flexion 5  Knee extension 4  Ankle dorsiflexion 5  Ankle plantarflexion 5/5  Ankle inversion 4+  Ankle eversion 4+   (Blank rows = not tested)  MMT LEFT eval  Hip flexion 4  Hip extension Can bridge  Hip abduction 4+ sit  Hip adduction 4+ sit  Hip internal rotation 4  Hip external rotation 4+  Knee flexion 5  Knee extension 4  Ankle dorsiflexion 4+  Ankle plantarflexion 4  Ankle inversion 4  Ankle eversion 3+   (Blank rows = not tested)  LOWER EXTREMITY FLEXIBILITY; HS Right 48, Left 53, Very tight ITB and piriformis B, Bilateral gastroc  FUNCTIONAL TESTS:  EVAL: 30 sec sit to stand:  8 using hands at knees (below avg for age)  4 position balance test able to maintain 10 sec, some sway with tandem stance, left greater than right SLS  Able to turn full circle in both directions without LOB, able to stand with eyes closed 20 seconds feet  slightly apart   TUG; 16 seconds, short steps, toe out, decreased heel strike. EVAL 09/24/2023 11:43 ist, 10;15 second, 9:58  GAIT: Distance walked: 40 Assistive device utilized: None Level of assistance: SBA Comments: short ,choppy steps, decreased heel strike, toe out   LOWER EXTREMITY FUNCTIONAL SCALE; 22   TODAY'S TREATMENT:                                                                                                                                          DATE:   09/24/2023 Nu Step seat 6, UE 8 lev 5 x 6 min to warm up, 493 steps Incline stretch 3 x 30 sec TUG 1 practice trial, 2 time trials Tandem stance bilaterally SLS bilaterally held 16- 18 sec x 2 reps ea Ax beam 6 lengths forward and 6 sideways with intermittent HH Scapular retaraction, extension and bilateral ER red x 10 Jobes flexion and scaption x 10 2 # Ambulated 720 feet without rest with good stride length and speed and erect posture  09/17/2023 all exercises with Supervision to light CGA Nu Step seat 6, UE 8 lev 5 x 6 min to warm up, 427 steps Incline stretch 3 x 30 sec Ax beam 8 lengths forward, 8 lengths sideways; improved control  Step and hold on ax x 10 forward and sideways Tandem stance x 2 ea to failure Vector reaches with touch x 5 ea, without touch x 5 SLS x 3 ea to failure Ambulated 650 feet and went up and down 4 steps x 4 with 1 rail. Emphasis on posture and gait. Scapular retraction, shoulder ext, bilateral ER red band x 10, then x 5 reps Ppt with ball squeeze x 10 Bridgingx 10 Ppt with marching Ppt with red clamshell x 10 09/12/2023 Nu Step seat 6, UE 8 lev 5 x 6 min to warm up, 431 steps Incline stretch at parallel bars x 3 x 20 sec Marching without HH x 20 Ax beam 8 lengths forward, 8 lengths sideways  Step and hold on ax x 10 forward and sideways Ambulated 701 ft and went up and down 4 stairs x 4 with emphasis on erect gait with head up and increased step length. Cable column scapular retraction and shoulder extension 2  x 10 Bridging x 10, 5 sec holds Ppt with purple ball squeeze x 10  LTR x 4 ea Ppt with low march 2 x 10  08/15/2023 Nu Step seat 6, UE 8, Lev 6  x 6 min to warm up, 398 steps Incline stretch at parallel bars x 3 x 20 sec Mini squats holding bar x5 emphasis on proper form then to high table without hand hold for proper form and to strengthen legs for stairs and sit to stand x 8. Held due to back/neck pain Tandem stance bilateral x  2 on ax pad to failure Ambulated 281 feet then stairs in ortho  gym x 3 up and down and return to cancer gym.seated rest Ax beam x 6 lengths forward and 6 lengths sideways with intermittent HH Jobes flex and scaption against wall x 10 Cable retraction 3# ea 2x 10 Standing extension yellow 2 x 10 SL on ax pad x 4 ea to failure Ambulated 331 ft with emphasis on erect posture and longer step length Marching on ax x 20 Heel raises on ax x15 Vector reaches B x3 with toe touch, x 3 ea no touch 08/10/2023 Discussed progress towards goals for recert and remaining deficits TUG 2 trials 12 sec, 14 sec, avg 13 sec 30 second sit to stand:9 reps SLS  3 reps to failure B, then 20 sec on left, 16 on right after several trials; inconsistent Ambulated 318 ft to ortho gym and back and stopped at steps;4 steps up and down working on eccentric control  Ax beam x 6 lengths forward and sideways with intermittent hand touch for balance Postural theraband;B Scapular retraction x 15 red, B shoulder ext x 15, single arm ER ea x 10 with red  Purple balance pad;reviewed HEP;marching, heel raises, half tandem stance B x 2 ea, tandem stance B x 2 ea Vector reaches x 5 with toe touch, tried without toe touch but was difficult    08/03/2023 Nu Step seat 6, UE 8, Lev 6  x 6 min to warm up, 397 steps Incline stretch at parallel bars x 3 x 20 sec Mini squats holding bar x5 emphasis on proper form then to high table without hand hold for proper form and to strengthen legs for stairs and sit to stand Tandem stance bilateral x 2 on ax pad to failure Vector reaches x 5 ea on floor,  DLS on ax x 30 sec SLS on ax x 3 ea to failure Ambulated 331 ft with stop at stairs;up and down 4 stairs  x 4 with rail with emphasis on eccentric control and increased step length with walking. Sit to stand 2 x 5 no hands emphasis on eccentric control 5 ea on oval disc to failure, hand touch prn Ambulated 318 ft with emphasis on increased step  length  PATIENT EDUCATION:  Access Code: [email or web address] URL: [email or web address] Date: 07/16/2023 Prepared by: Alvira Monday  Exercises - Supine Bridge  - 1 x daily - 7 x weekly - 1 sets - 10 reps - Supine Pelvic Tilt  - 1 x daily - 7 x weekly - 1 sets - 10 reps - Supine Hip Adduction Isometric with Ball  - 1 x daily - 7 x weekly - 1 sets - 10 reps Access Code: [email or web address] URL: [email or web address] Date: 07/05/2023 Prepared by: Alvira Monday  Exercises - Supine Hamstring Stretch with Strap  - 1 x daily - 7 x weekly - 1 sets - 3 reps - 20-30 hold - Supine Hip External Rotation Stretch  - 1 x daily - 7 x weekly - 3 sets - 3 reps - 20-30 hold - Seated Calf Stretch with Strap  - 1 x daily - 7 x weekly - 1 sets - 3 reps - 20-30 hold - Supine ITB Stretch with Strap  - 1 x daily - 7 x weekly - 1 sets - 3 reps - 20 hold Education details: POC, visit number, treatment interventions. Will include aquatic therapy but not to start and only if she wants to Person educated: Patient Education method: Explanation Education comprehension: verbalized understanding  HOME EXERCISE PROGRAM: Set alarm for 60 min to get  up and walk around house  ASSESSMENT:  Pt has achieved all goals established except return to riding the bike at the gym. She has been compliant with HEP, She has achieved her STG's and LTG's established for the TUG and SLS. She has had no complaints of ITB pain and can get in and out of the car without pain. She is ambulating with improved stride length and a much safer gait pattern.  She has met her Sit to stand goal, and she can don her socks with improved ease.The only goal not achieved was her new goal for riding the bike at the gym 2 days per week. She is no longer in remission and she is pending further treatment at [facility].   OBJECTIVE IMPAIRMENTS: Abnormal gait, decreased activity tolerance, decreased balance, decreased strength, impaired flexibility, impaired  sensation, postural dysfunction, and pain.   ACTIVITY LIMITATIONS: carrying, bending, standing, squatting, stairs, and locomotion level  PARTICIPATION LIMITATIONS: cleaning, shopping, and community activity  PERSONAL FACTORS: Age and 1-2 comorbidities: Multiple Myeloma and current treatments  are also affecting patient's functional outcome.   REHAB POTENTIAL: Good  CLINICAL DECISION MAKING: Stable/uncomplicated  EVALUATION COMPLEXITY: Low  GOALS: Goals reviewed with patient? Yes  SHORT TERM GOALS: Target date: 07/12/2022  Pt will be independent with HEP to improve flexibility and strength Baseline: Goal status: MET but not consistently due to fatigue 2.  Pt will perform TUG in 13 seconds to decrease fall risk Baseline:  Goal status: MET 08/10/2023 3.  Pt will be able to perform  9 sit to stands in 30 seconds for average rating for her age Baseline: 8 Goal status: MET 08/10/2023  4.  Pt will have decreased right ITB pain by 25% Baseline:  Goal status: MET 08/10/2023 5.  Pt will ambulate with improved stride length and heel strike for more normal gait Baseline Goal status: MET 08/03/2023  LONG TERM GOALS: Target date: 08/09/2022  Pt will be able to don socks with improved ease Baseline:  Goal status: MET 08/03/2023  2.  Pt will have decreased right ITB pain by 50% or greater Baseline:  Goal status: MET 08/10/2023  3.  Pt will be able to get out of her car without right ITB pain Baseline:  Goal status: MET 07/15/2022  4.  LEFS will be increased to 40 to demonstrate improved function Baseline:  Goal status: MET 08/10/2023 (41)  5.  Pt will perform TUG in 11 seconds  or less to demonstrate decreased fall risk Baseline:  Goal status: MET 09/24/2023   6.  Pt will perform SLS for 14 seconds on each side to achieve age related norms Baseline:  Goal status: MET 09/24/2023   7.  Pt will return to riding the bike at the gym for atleast 10 min 2x's per week in addition to her  HEP.  Baseline;not going  Goal Status; NOT MET due to other health concerns PLAN:  PT FREQUENCY: 2x/week for 1 week, then 1x/week for 5 weeks as pt transitions to gym program  PT DURATION: 8 weeks  PLANNED INTERVENTIONS: 97164- PT Re-evaluation, 97110-Therapeutic exercises, 97530- Therapeutic activity, 97112- Neuromuscular re-education, 97535- Self Care, 16109- Manual therapy, [phone]- Gait training, and [phone]- Aquatic Therapy  PLAN FOR NEXT SESSION: Recert/DC next, add UE strength, ITB stretches;already gave pics, rolling to bilateral ITB prn, strength, balance; HEP; mini squats, sit to stand, stretches etc PHYSICAL THERAPY DISCHARGE SUMMARY  Visits from Start of Care: 14  Current functional level related to goals / functional outcomes: Achieved  all goals except return to gym to ride bike   Remaining deficits: Has not returned to gym to ride bike. Pending further treatment at [facility] due to Relapse   Education / Equipment: [facility] [place], [facility]   Patient agrees to discharge. Patient goals were nearly all met. Patient is being discharged due to meeting the stated rehab goals.and pending further treatment for relapse.   Waynette Buttery, PT 09/24/2023, 1:24 PM

## 2023-09-25 ENCOUNTER — Inpatient Hospital Stay: Attending: Hematology and Oncology

## 2023-09-25 DIAGNOSIS — C9002 Multiple myeloma in relapse: Secondary | ICD-10-CM | POA: Diagnosis present

## 2023-09-25 DIAGNOSIS — D801 Nonfamilial hypogammaglobulinemia: Secondary | ICD-10-CM | POA: Diagnosis not present

## 2023-09-25 DIAGNOSIS — T451X5A Adverse effect of antineoplastic and immunosuppressive drugs, initial encounter: Secondary | ICD-10-CM

## 2023-09-25 DIAGNOSIS — Z9221 Personal history of antineoplastic chemotherapy: Secondary | ICD-10-CM | POA: Diagnosis not present

## 2023-09-25 LAB — CBC WITH DIFFERENTIAL/PLATELET
Abs Immature Granulocytes: 0.01 10*3/uL (ref 0.00–0.07)
Basophils Absolute: 0 10*3/uL (ref 0.0–0.1)
Basophils Relative: 0 %
Eosinophils Absolute: 0 10*3/uL (ref 0.0–0.5)
Eosinophils Relative: 0 %
HCT: 34.8 % — ABNORMAL LOW (ref 36.0–46.0)
Hemoglobin: 11.1 g/dL — ABNORMAL LOW (ref 12.0–15.0)
Immature Granulocytes: 0 %
Lymphocytes Relative: 21 %
Lymphs Abs: 1.3 10*3/uL (ref 0.7–4.0)
MCH: 34.4 pg — ABNORMAL HIGH (ref 26.0–34.0)
MCHC: 31.9 g/dL (ref 30.0–36.0)
MCV: 107.7 fL — ABNORMAL HIGH (ref 80.0–100.0)
Monocytes Absolute: 0.7 10*3/uL (ref 0.1–1.0)
Monocytes Relative: 10 %
Neutro Abs: 4.3 10*3/uL (ref 1.7–7.7)
Neutrophils Relative %: 69 %
Platelets: 170 10*3/uL (ref 150–400)
RBC: 3.23 MIL/uL — ABNORMAL LOW (ref 3.87–5.11)
RDW: 13.3 % (ref 11.5–15.5)
WBC: 6.3 10*3/uL (ref 4.0–10.5)
nRBC: 0 % (ref 0.0–0.2)

## 2023-09-25 LAB — COMPREHENSIVE METABOLIC PANEL WITH GFR
ALT: 12 U/L (ref 0–44)
AST: 18 U/L (ref 15–41)
Albumin: 4.1 g/dL (ref 3.5–5.0)
Alkaline Phosphatase: 87 U/L (ref 38–126)
Anion gap: 5 (ref 5–15)
BUN: 20 mg/dL (ref 8–23)
CO2: 30 mmol/L (ref 22–32)
Calcium: 9.3 mg/dL (ref 8.9–10.3)
Chloride: 104 mmol/L (ref 98–111)
Creatinine, Ser: 0.83 mg/dL (ref 0.44–1.00)
GFR, Estimated: >60 mL/min (ref >60)
Glucose, Bld: 108 mg/dL — ABNORMAL HIGH (ref 70–99)
Potassium: 4.1 mmol/L (ref 3.5–5.1)
Sodium: 139 mmol/L (ref 135–145)
Total Bilirubin: 0.2 mg/dL (ref 0.0–1.2)
Total Protein: 7.4 g/dL (ref 6.5–8.1)

## 2023-09-26 LAB — KAPPA/LAMBDA LIGHT CHAINS
Kappa free light chain: 10.1 mg/L (ref 3.3–19.4)
Kappa, lambda light chain ratio: 0.08 — ABNORMAL LOW (ref 0.26–1.65)
Lambda free light chains: 133.2 mg/L — ABNORMAL HIGH (ref 5.7–26.3)

## 2023-09-26 LAB — T-HELPER CELLS (CD4) COUNT (NOT AT ARMC)
CD4 % Helper T Cell: 16 % — ABNORMAL LOW (ref 33–65)
CD4 T Cell Abs: 208 /uL — ABNORMAL LOW (ref 400–1790)

## 2023-09-28 ENCOUNTER — Other Ambulatory Visit

## 2023-09-28 LAB — MULTIPLE MYELOMA PANEL, SERUM
Albumin SerPl Elph-Mcnc: 3.2 g/dL (ref 2.9–4.4)
Albumin/Glob SerPl: 1 (ref 0.7–1.7)
Alpha 1: 0.2 g/dL (ref 0.0–0.4)
Alpha2 Glob SerPl Elph-Mcnc: 0.9 g/dL (ref 0.4–1.0)
B-Globulin SerPl Elph-Mcnc: 0.9 g/dL (ref 0.7–1.3)
Gamma Glob SerPl Elph-Mcnc: 1.2 g/dL (ref 0.4–1.8)
Globulin, Total: 3.3 g/dL (ref 2.2–3.9)
IgA: 340 mg/dL (ref 64–422)
IgG (Immunoglobin G), Serum: 864 mg/dL (ref 586–1602)
IgM (Immunoglobulin M), Srm: 65 mg/dL (ref 26–217)
M Protein SerPl Elph-Mcnc: 0.5 g/dL — ABNORMAL HIGH
Total Protein ELP: 6.5 g/dL (ref 6.0–8.5)

## 2023-10-05 ENCOUNTER — Encounter: Payer: Self-pay | Admitting: Hematology and Oncology

## 2023-10-05 ENCOUNTER — Inpatient Hospital Stay: Admitting: Hematology and Oncology

## 2023-10-05 VITALS — BP 142/76 | HR 78 | Temp 97.7°F | Resp 18 | Ht 62.0 in | Wt 159.2 lb

## 2023-10-05 DIAGNOSIS — C9002 Multiple myeloma in relapse: Secondary | ICD-10-CM

## 2023-10-05 MED ORDER — MIRTAZAPINE 15 MG PO TABS: 15.0000 mg | ORAL_TABLET | Freq: Every day | ORAL | 1 refills | Status: DC

## 2023-10-05 NOTE — Progress Notes (Signed)
 [facility] OFFICE PROGRESS NOTE  Patient Care Team: Patient, [name] as PCP - General (General Practice) Iran Ouch, MD as PCP - Cardiology (Cardiology) Astrid Drafts, MD as Referring Physician (Hematology and Oncology) Eddie Candle, MD as Consulting Physician (Internal Medicine) Daiva Eves, Lisette Grinder, MD as Consulting Physician (Infectious Diseases) Lorriane Shire An, MD as Consulting Physician (Hematology and Oncology) Sadie Haber, NP as Nurse Practitioner (Hematology and Oncology) Artis Delay, MD as Consulting Physician (Hematology and Oncology)  Assessment & Plan Multiple myeloma in relapse [facility]) 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  I reviewed myeloma panel from last week with the patient She has signs of disease progression although currently she is not symptomatic She is currently in the process of getting admitted next week to start treatment with Elrexfio I refilled some of her prescriptions In the future, once she has achieved stable disease control with no major side effects, she can transfer her care here locally  No orders of the defined types were placed in this encounter.    Artis Delay, MD  INTERVAL HISTORY: she returns for surveillance follow-up for recurrent multiple myeloma I have reviewed  outside records She is currently scheduled for admission at [facility] next week to start her first treatment She denies any recent infection She requested refill of Remeron  PHYSICAL EXAMINATION: ECOG PERFORMANCE STATUS: 0 - Asymptomatic  Vitals:   10/05/23 1141  BP: (!) 142/76  Pulse: 78  Resp: 18  Temp: 97.7 F (36.5 C)  SpO2: 97%   Filed Weights   10/05/23 1141  Weight: 159 lb 3.2 oz (72.2 kg)    Relevant data reviewed during this visit included CBC, CMP and recent myeloma panel

## 2023-10-05 NOTE — Assessment & Plan Note (Addendum)
 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  I reviewed myeloma panel from last week with the patient She has signs of disease progression although currently she is not symptomatic She is currently in the process of getting admitted next week to start treatment with Elrexfio I refilled some of her prescriptions In the future, once she has achieved stable disease control with no major side effects, she can transfer her care here locally

## 2023-12-04 ENCOUNTER — Telehealth: Payer: Self-pay

## 2023-12-05 NOTE — Telephone Encounter (Addendum)
 Addendum on 01/09/24:  Patient presented to [facility] for transferring of care from [facility]. Patient received updated patient wallet card and did have education completed at [facility]. Patient did not have any questions for me and will proceed with Dr. Federico and treatment.  Hematology Clinic Pharmacist Note   IgA lambda multiple myeloma BiTE agent: Elrexfio  [facility] patient received step-up dosing: [facility] (Dr. Guinevere) Date of initiation: 10/09/2023 Supportive care medications: Acyclovir  and atovaquone suspension [take 10mls (1500mg  total) by mouth daily with breakfast], pantoprazole  40mg  daily, zofran  and compazine  PRN for nausea, levaquin  and fluconazole only if neutropenic  Weight: 73.6 kg   Last dose of IVIG: 12/18/2023  Date of Day 1: 10/09/2023 Step up dose 1: 12mg  CRS grade: Grade 2 CRS medications administered:      - 4/16: patient had temp of 38.4       - 4/17 AM: Patient had chills, Tmax: 38.9 - Tylenol  and cefepime  started (neutropenia)      - 4/17 PM: Patient temp up to 38.5 and mild chills - tocilizumab x1 dose and tylenol  ICANS grade: n/a ICANS medications administered: none      - 4/17: patient had right sided headache and Fiorcet was administered  Date of Day 4: 10/12/2023 Step up dose 2: 32mg  CRS grade: N/A CRS medications administered: none ICANS grade: N/A ICANS medications administered: none  Cefepime  discontinued on 10/13/2023 (neutropenia resolved)  Date of Day 7: 10/16/2023 First treatment dose: 76mg  CRS grade: N/A CRS medications administered: none ICANS grade: N/A ICANS medications administered: none  Patient's hematologist will be Dr. Federico and will see MD with labs weekly dosing and will have the first dose at [facility] on 01/09/2024. Will need to continue to monitor CMV labs due to detection of low level.  Last labs drawn on 12/04/23:  Hgb 13.5 plts 156 wbc 4.4 Creatinine 0.8 Calcium  8.7 SPEP IFE  negative CMV detected low level-will continue to monitor IgG 257, IgM <4, IgA <6 FLC kappa <0.07 lambda <0.15   Patient had light chains/ multiple myeloma labs on 01/02/24 at [facility]. Will discuss with MD regarding the need of IVIG due to increase in IgG level this month. Patient has magnesium  drawn once per month last completed on 01/02/24.   Temitope Griffing, PharmD Hematology/Oncology Clinical Pharmacist [phone]

## 2023-12-19 ENCOUNTER — Other Ambulatory Visit: Payer: Self-pay | Admitting: Internal Medicine

## 2023-12-26 ENCOUNTER — Telehealth: Payer: Self-pay | Admitting: Hematology and Oncology

## 2023-12-26 NOTE — Telephone Encounter (Signed)
 Scheduled appointments per 8/1 secure chat. Talked with the patient and she is aware of the made appointments.

## 2024-01-03 ENCOUNTER — Other Ambulatory Visit: Payer: Self-pay | Admitting: Hematology and Oncology

## 2024-01-03 DIAGNOSIS — C9002 Multiple myeloma in relapse: Secondary | ICD-10-CM

## 2024-01-03 NOTE — Progress Notes (Signed)
 DISCONTINUE OFF PATHWAY REGIMEN - Multiple Myeloma and Other Plasma Cell Dyscrasias   [id number]:Carfilzomib  20/56 mg/m2 Monotherapy q28 Days:   A cycle is every 28 days:     Carfilzomib       Carfilzomib       Carfilzomib       Carfilzomib    **Always confirm dose/schedule in your pharmacy ordering system**  PRIOR TREATMENT: Off Pathway: Carfilzomib  20/56 mg/m2 Monotherapy q28 Days  START ON PATHWAY REGIMEN - Multiple Myeloma and Other Plasma Cell Dyscrasias   Elranatamab 76 mg SUBQ q7 Days (with step-up dosing):   Cycle 1: A cycle is 14 days:     Elranatamab-bcmm      Elranatamab-bcmm      Elranatamab-bcmm    Cycles 2 and beyond: A cycle is every 7 days:     Elranatamab-bcmm    Elranatamab 76 mg SUBQ D1 q14 Days (post partial response or better):   A cycle is every 14 days:     Elranatamab-bcmm   **Always confirm dose/schedule in your pharmacy ordering system**  Patient Characteristics: Multiple Myeloma, Relapsed / Refractory, Fifth Line and Beyond, Candidate for Bispecific T-cell Engaging Antibody (BiTE) Disease Classification: Multiple Myeloma Therapeutic Status: Relapsed R2-ISS Staging: II Line of Therapy: Fifth Line and Beyond Intent of Therapy: Non-Curative / Palliative Intent, Discussed with Patient

## 2024-01-04 ENCOUNTER — Other Ambulatory Visit: Payer: Self-pay

## 2024-01-04 ENCOUNTER — Encounter: Payer: Self-pay | Admitting: Hematology and Oncology

## 2024-01-08 NOTE — Progress Notes (Signed)
 [facility] [phone]   [phone]  PROGRESS NOTE  Patient Care Team: Patient, [name] as PCP - General (General Practice) Jean Deatrice LABOR, MD as PCP - Cardiology (Cardiology) Jean Liborio Backers, MD as Referring Physician (Hematology and Oncology) Jean File, MD as Consulting Physician (Internal Medicine) Jean Davidson, Jean SAILOR, MD as Consulting Physician (Infectious Diseases) Jean Maranda Foots An, MD as Consulting Physician (Hematology and Oncology) Jean Lenon Irving, NP as Nurse Practitioner (Hematology and Oncology) Jean Hicks, MD as Consulting Physician (Hematology and Oncology)  Hematological/Oncological History # Relapsed/Refractory IgA Lambda Multiple Myeloma, In Remission 2016: 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 Federico.   Interval History:  Jean Davidson 78 y.o. female with medical history significant for relapsed/refractory multiple myeloma who presents for a follow up visit. The patient's last visit was on 10/05/2023 with Dr. Lonn prior to the start of by specific therapy. In the interim since the last visit she has successfully begun induction Elrexfio  and presents now to begin maintenance care at [facility] health.  Jean Davidson reports that she does have issues with fatigue but is not currently having any lightheadedness, dizziness, or shortness of breath.  She notes that she does occasionally fear falling due to weakness.  She reports that she has not had any fevers in interim since her last visit at [facility].  She does have some neuropathy in her hands and feet.  She reports that she does occasionally stumble and that she needs  to pick up her feet.  She reports that she otherwise has had no changes in her health in the room since her last visit.  A full 10 point ROS is otherwise negative.  The bulk of our visit focused on reviewing her medical history, discussing Elrexfio  maintenance, and discussing the plan and scheduling moving forward.  Details of this conversation are noted below.  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   Multiple myeloma (HCC)    Osteopenia    Pinched nerve    Sinusitis, bacterial 07/27/2015   Spinal stenosis in cervical region    Varicose veins of lower extremities with inflammation     SURGICAL HISTORY: Past Surgical History:  Procedure Laterality Date   ANKLE SURGERY Left    BLADDER SUSPENSION  1988/1989   x2   COLONOSCOPY     ESOPHAGOGASTRODUODENOSCOPY     IR IMAGING GUIDED PORT INSERTION  03/01/2018   LIMBAL STEM CELL TRANSPLANT     x2   PORT-A-CATH  REMOVAL     SHOULDER SURGERY Right 04/06/2013   right   UPPER GASTROINTESTINAL ENDOSCOPY      SOCIAL HISTORY: Social History   Socioeconomic History   Marital status: Divorced    Spouse name: Not on Davidson   Number of children: Not on Davidson   Years of education: Not on Davidson   Highest education level: Not on Davidson  Occupational History   Not on Davidson  Tobacco Use   Smoking status: Former     Current packs/day: 0.00    Average packs/day: 0.5 packs/day for 4.0 years (2.0 ttl pk-yrs)    Types: Cigarettes    Start date: 06/26/1964    Quit date: 06/26/1968    Years since quitting: 55.6   Smokeless tobacco: Never  Vaping Use   Vaping status: Never Used  Substance and Sexual Activity   Alcohol  use: Yes    Alcohol /week: 1.0 standard drink of alcohol     Types: 1 Cans of beer per week    Comment: 1 per week per pt   Drug use: No   Sexual activity: Not on Davidson  Other Topics Concern   Not on Davidson  Social History Narrative   Jean Davidson is daughter Jean Davidson, Jean Davidson,  full code ( reviewed 22)   Social Drivers of Health   Financial Resource Strain: Low Risk  (10/09/2023)   Received from [facility]   Overall Financial Resource Strain (CARDIA)    Difficulty of Paying Living Expenses: Not hard at all  Food Insecurity: No Food Insecurity (10/09/2023)   Received from [facility]   Hunger Vital Sign    Within the past 12 months, you worried that your food would run out before you got the money to buy more.: Never true    Within the past 12 months, the food you bought just didn't last and you didn't have money to get more.: Never true  Transportation Needs: Unknown (10/09/2023)   Received from [facility] - Transportation    In the past 12 months, has lack of transportation kept you from medical appointments or from getting medications?: No    Lack of Transportation (Non-Medical): Not on Davidson  Physical Activity: Not on Davidson  Stress: Not on Davidson  Social Connections: Not on Davidson  Intimate Partner Violence: Not on Davidson    FAMILY HISTORY: Family History  Problem Relation Age of Onset   Heart disease Mother    Heart failure Father    Heart disease Father    Throat cancer Father    Skin cancer Father    Diabetes Father    Kidney disease Father    Hypertension Brother    Heart disease Brother        congenital  shunt, ?    Colon cancer Paternal Aunt 16   Skin cancer Brother    Stomach cancer Neg Hx    Esophageal cancer Neg Hx    Rectal cancer Neg Hx     ALLERGIES:  has no known allergies.  MEDICATIONS:  Current Outpatient Medications  Medication Sig Dispense Refill   acyclovir  (ZOVIRAX ) 400 MG tablet Take 1 tablet (400 mg total) by mouth 2 (two) times daily. 180 tablet 1   aspirin EC 81 MG tablet Take 81 mg by mouth daily. Swallow whole.     atovaquone (MEPRON) 750 MG/5ML suspension Take 1,500 mg by mouth 2 (two) times daily.     cholecalciferol  (VITAMIN D3) 25 MCG (1000  UNIT) tablet Take 4,000 Units by mouth daily. Instructed to increase to 4,000 units daily     metoprolol  tartrate (LOPRESSOR ) 25 MG tablet TAKE 1 & 1/2 TABLET BY MOUTH TWICE A DAY--due for follow up appointment. 270 tablet 3   mirtazapine  (REMERON ) 15 MG tablet Take 1 tablet (15 mg total) by mouth at bedtime. 90 tablet 1   Multiple Vitamins-Minerals (PRESERVISION AREDS 2 PO) Take 1 tablet by mouth 2 (two) times daily.     ondansetron  (ZOFRAN ) 8 MG tablet Take 1 tablet (8 mg total) by mouth every 8 (eight) hours as needed (Nausea or vomiting). (Patient taking differently: Take 8 mg by mouth 2 (two) times daily.) 60 tablet 1   pantoprazole  (PROTONIX ) 40 MG tablet TAKE 1 TABLET BY MOUTH 2 TIMES DAILY. TAKE 30-60 MINUTES BEFORE BREAKFAST AND DINNER. 180 tablet 0   prochlorperazine  (COMPAZINE ) 5 MG tablet Take 5 mg by mouth every 6 (six) hours as needed for nausea or vomiting.     senna-docusate (SENOKOT-S) 8.6-50 MG tablet Take 2 tablets by mouth daily as needed.     No current facility-administered medications for this visit.    REVIEW OF SYSTEMS:   Constitutional: ( - ) fevers, ( - )  chills , ( - ) night sweats Eyes: ( - ) blurriness of vision, ( - ) double vision, ( - ) watery eyes Ears, nose, mouth, throat, and face: ( - ) mucositis, ( - ) sore throat Respiratory: ( - ) cough, ( - ) dyspnea, ( - ) wheezes Cardiovascular: (  - ) palpitation, ( - ) chest discomfort, ( - ) lower extremity swelling Gastrointestinal:  ( - ) nausea, ( - ) heartburn, ( - ) change in bowel habits Skin: ( - ) abnormal skin rashes Lymphatics: ( - ) new lymphadenopathy, ( - ) easy bruising Neurological: ( - ) numbness, ( - ) tingling, ( - ) new weaknesses Behavioral/Psych: ( - ) mood change, ( - ) new changes  All other systems were reviewed with the patient and are negative.  PHYSICAL EXAMINATION: ECOG PERFORMANCE STATUS: 1 - Symptomatic but completely ambulatory  Vitals:   01/09/24 0853  BP: (!) 146/91  Pulse: 71  Resp: 14  Temp: (!) 97.1 F (36.2 C)  SpO2: 97%   Filed Weights   01/09/24 0853  Weight: 160 lb 14.4 oz (73 kg)    GENERAL: Well-appearing elderly Caucasian female, alert, no distress and comfortable SKIN: skin color, texture, turgor are normal, no rashes or significant lesions EYES: conjunctiva are pink and non-injected, sclera clear LUNGS: clear to auscultation and percussion with normal breathing effort HEART: regular rate & rhythm and no murmurs and no lower extremity edema Musculoskeletal: no cyanosis of digits and no clubbing  PSYCH: alert & oriented x 3, fluent speech NEURO: no focal motor/sensory deficits  LABORATORY DATA:  I have reviewed the data as listed    Latest Ref Rng & Units 01/16/2024    7:38 AM 01/09/2024    8:19 AM 09/25/2023   10:02 AM  CBC  WBC 4.0 - 10.5 K/uL 3.3  3.5  6.3   Hemoglobin 12.0 - 15.0 g/dL 87.3  87.8  88.8   Hematocrit 36.0 - 46.0 % 36.9  35.7  34.8   Platelets 150 - 400 K/uL 144  163  170        Latest Ref Rng & Units 01/16/2024    7:38 AM 01/09/2024    8:19 AM 09/25/2023   10:02 AM  CMP  Glucose 70 - 99 mg/dL 892  883  891   BUN 8 - 23 mg/dL 18  16  20    Creatinine 0.44 - 1.00 mg/dL 9.18  9.23  9.16   Sodium 135 - 145 mmol/L 141  141  139   Potassium 3.5 - 5.1 mmol/L 3.8  3.8  4.1   Chloride 98 - 111 mmol/L 106  106  104   CO2 22 - 32 mmol/L 28  29  30    Calcium   8.9 - 10.3 mg/dL 8.8  9.1  9.3   Total Protein 6.5 - 8.1 g/dL 6.4  6.5  7.4   Total Bilirubin 0.0 - 1.2 mg/dL 0.4  0.4  0.2   Alkaline Phos 38 - 126 U/L 80  72  87   AST 15 - 41 U/L 18  21  18    ALT 0 - 44 U/L 12  15  12      Lab Results  Component Value Date   MPROTEIN 0.5 (H) 09/25/2023   MPROTEIN 0.5 (H) 08/31/2023   MPROTEIN 0.3 (H) 08/07/2023   Lab Results  Component Value Date   KPAFRELGTCHN 10.1 09/25/2023   KPAFRELGTCHN 12.1 08/31/2023   KPAFRELGTCHN 13.4 08/07/2023   LAMBDASER 133.2 (H) 09/25/2023   LAMBDASER 98.2 (H) 08/31/2023   LAMBDASER 66.4 (H) 08/07/2023   KAPLAMBRATIO 0.08 (L) 09/25/2023   KAPLAMBRATIO 0.12 (L) 08/31/2023   KAPLAMBRATIO 0.20 (L) 08/07/2023     RADIOGRAPHIC STUDIES: No results found.  ASSESSMENT & PLAN Jean Davidson 78 y.o. female with medical history significant for relapsed/refractory multiple myeloma who presents for a follow up visit.   # Relapsed/Refractory IgA Lambda Multiple Myeloma, In Remission -- Patient is undergone numerous prior therapies including autosomal stem cell transplant, allogenic stem cell transplant, CAR-T therapy, and now bispecific therapy --Patient completed her induction of bispecific therapy at [facility] -- Patient presents today to begin maintenance therapy. --Labs today show white blood cell 3.3, hemoglobin 12.6, MCV 102.2, platelets 144.  LFTs and creatinine adequate for continued treatment -- Davidson plan for every 2 week maintenance Elrexfio  with clinic visits every other treatment.  #Supportive Care -- zofran  8mg  q8H PRN and compazine  10mg  PO q6H for nausea -- acyclovir  400mg  PO BID for VCZ prophylaxis --Continue atovaquone 1500 mg twice daily -- IVIG to be administered when immunoglobulins are less than 400 -- EMLA  cream for port -- no pain medication required at this time.    Orders Placed This Encounter  Procedures   CBC with Differential (Cancer Center Only)    Standing Status:    Future    Number of Occurrences:   1    Expected Date:   01/16/2024    Expiration Date:   01/15/2025   CMP (Cancer Center only)    Standing Status:   Future    Number of Occurrences:   1    Expected Date:   01/16/2024    Expiration Date:   01/15/2025   CBC with Differential (Cancer Center Only)    Standing Status:   Future    Expected Date:   01/23/2024    Expiration Date:   01/22/2025   CMP (Cancer Center only)    Standing Status:   Future    Expected Date:   01/23/2024    Expiration Date:   01/22/2025   CBC with Differential (Cancer Center Only)    Standing Status:   Future    Expected Date:   01/30/2024  Expiration Date:   01/29/2025   CMP (Cancer Center only)    Standing Status:   Future    Expected Date:   01/30/2024    Expiration Date:   01/29/2025   CBC with Differential (Cancer Center Only)    Standing Status:   Future    Expected Date:   02/06/2024    Expiration Date:   02/05/2025   CMP (Cancer Center only)    Standing Status:   Future    Expected Date:   02/06/2024    Expiration Date:   02/05/2025    All questions were answered. The patient knows to call the clinic with any problems, questions or concerns.  A total of more than 40 minutes were spent on this encounter with face-to-face time and non-face-to-face time, including preparing to see the patient, ordering tests and/or medications, counseling the patient and coordination of care as outlined above.   Norleen IVAR Kidney, MD Department of Hematology/Oncology [facility] [place] [facility] Phone: [phone] Pager: [phone] Email: [email or web address]  01/20/2024 3:49 PM

## 2024-01-09 ENCOUNTER — Inpatient Hospital Stay

## 2024-01-09 ENCOUNTER — Inpatient Hospital Stay (HOSPITAL_BASED_OUTPATIENT_CLINIC_OR_DEPARTMENT_OTHER): Admitting: Hematology and Oncology

## 2024-01-09 ENCOUNTER — Inpatient Hospital Stay: Attending: Hematology and Oncology

## 2024-01-09 ENCOUNTER — Encounter: Payer: Self-pay | Admitting: Hematology and Oncology

## 2024-01-09 VITALS — BP 146/91 | HR 71 | Temp 97.1°F | Resp 14 | Wt 160.9 lb

## 2024-01-09 DIAGNOSIS — C9002 Multiple myeloma in relapse: Secondary | ICD-10-CM | POA: Insufficient documentation

## 2024-01-09 LAB — CMP (CANCER CENTER ONLY)
ALT: 15 U/L (ref 0–44)
AST: 21 U/L (ref 15–41)
Albumin: 4.1 g/dL (ref 3.5–5.0)
Alkaline Phosphatase: 72 U/L (ref 38–126)
Anion gap: 6 (ref 5–15)
BUN: 16 mg/dL (ref 8–23)
CO2: 29 mmol/L (ref 22–32)
Calcium: 9.1 mg/dL (ref 8.9–10.3)
Chloride: 106 mmol/L (ref 98–111)
Creatinine: 0.76 mg/dL (ref 0.44–1.00)
GFR, Estimated: >60 mL/min (ref >60)
Glucose, Bld: 116 mg/dL — ABNORMAL HIGH (ref 70–99)
Potassium: 3.8 mmol/L (ref 3.5–5.1)
Sodium: 141 mmol/L (ref 135–145)
Total Bilirubin: 0.4 mg/dL (ref 0.0–1.2)
Total Protein: 6.5 g/dL (ref 6.5–8.1)

## 2024-01-09 LAB — CBC WITH DIFFERENTIAL (CANCER CENTER ONLY)
Abs Immature Granulocytes: 0.01 K/uL (ref 0.00–0.07)
Basophils Absolute: 0 K/uL (ref 0.0–0.1)
Basophils Relative: 1 %
Eosinophils Absolute: 0 K/uL (ref 0.0–0.5)
Eosinophils Relative: 0 %
HCT: 35.7 % — ABNORMAL LOW (ref 36.0–46.0)
Hemoglobin: 12.1 g/dL (ref 12.0–15.0)
Immature Granulocytes: 0 %
Lymphocytes Relative: 33 %
Lymphs Abs: 1.2 K/uL (ref 0.7–4.0)
MCH: 34.6 pg — ABNORMAL HIGH (ref 26.0–34.0)
MCHC: 33.9 g/dL (ref 30.0–36.0)
MCV: 102 fL — ABNORMAL HIGH (ref 80.0–100.0)
Monocytes Absolute: 0.6 K/uL (ref 0.1–1.0)
Monocytes Relative: 16 %
Neutro Abs: 1.7 K/uL (ref 1.7–7.7)
Neutrophils Relative %: 50 %
Platelet Count: 163 K/uL (ref 150–400)
RBC: 3.5 MIL/uL — ABNORMAL LOW (ref 3.87–5.11)
RDW: 14.6 % (ref 11.5–15.5)
WBC Count: 3.5 K/uL — ABNORMAL LOW (ref 4.0–10.5)
nRBC: 0 % (ref 0.0–0.2)

## 2024-01-09 MED ORDER — ELRANATAMAB-BCMM 76 MG/1.9ML ~~LOC~~ SOLN
76.0000 mg | Freq: Once | SUBCUTANEOUS | Status: AC
  Administered 2024-01-09: 76 mg via SUBCUTANEOUS
  Filled 2024-01-09: qty 1.9

## 2024-01-09 NOTE — Patient Instructions (Addendum)
 [facility] - A DEPT OF [facility]  Discharge Instructions: Thank you for choosing [facility] to provide your oncology and hematology care.   If you have a lab appointment with the Cancer Center, please go directly to the Cancer Center and check in at the registration area.   Wear comfortable clothing and clothing appropriate for easy access to any Portacath or PICC line.   We strive to give you quality time with your provider. You may need to reschedule your appointment if you arrive late (15 or more minutes).  Arriving late affects you and other patients whose appointments are after yours.  Also, if you miss three or more appointments without notifying the office, you may be dismissed from the clinic at the provider's discretion.      For prescription refill requests, have your pharmacy contact our office and allow 72 hours for refills to be completed.    Today you received the following chemotherapy and/or immunotherapy agents: Elranatamab -bcmm (Elrexfio )   To help prevent nausea and vomiting after your treatment, we encourage you to take your nausea medication as directed.  BELOW ARE SYMPTOMS THAT SHOULD BE REPORTED IMMEDIATELY: *FEVER GREATER THAN 100.4 F (38 C) OR HIGHER *CHILLS OR SWEATING *NAUSEA AND VOMITING THAT IS NOT CONTROLLED WITH YOUR NAUSEA MEDICATION *UNUSUAL SHORTNESS OF BREATH *UNUSUAL BRUISING OR BLEEDING *URINARY PROBLEMS (pain or burning when urinating, or frequent urination) *BOWEL PROBLEMS (unusual diarrhea, constipation, pain near the anus) TENDERNESS IN MOUTH AND THROAT WITH OR WITHOUT PRESENCE OF ULCERS (sore throat, sores in mouth, or a toothache) UNUSUAL RASH, SWELLING OR PAIN  UNUSUAL VAGINAL DISCHARGE OR ITCHING   Items with * indicate a potential emergency and should be followed up as soon as possible or go to the Emergency Department if any problems should occur.  Please show the CHEMOTHERAPY ALERT CARD or  IMMUNOTHERAPY ALERT CARD at check-in to the Emergency Department and triage nurse.  Should you have questions after your visit or need to cancel or reschedule your appointment, please contact [facility] - A DEPT OF [facility]  Dept: [phone]  and follow the prompts.  Office hours are 8:00 a.m. to 4:30 p.m. Monday - Friday. Please note that voicemails left after 4:00 p.m. may not be returned until the following business day.  We are closed weekends and major holidays. You have access to a nurse at all times for urgent questions. Please call the main number to the clinic Dept: [phone] and follow the prompts.   For any non-urgent questions, you may also contact your provider using MyChart. We now offer e-Visits for anyone 64 and older to request care online for non-urgent symptoms. For details visit [email or web address].   Also download the MyChart app! Go to the app store, search MyChart, open the app, select [place], and log in with your MyChart username and password.

## 2024-01-16 ENCOUNTER — Inpatient Hospital Stay

## 2024-01-16 VITALS — BP 147/62 | HR 63 | Temp 98.6°F | Resp 18 | Wt 162.0 lb

## 2024-01-16 DIAGNOSIS — C9002 Multiple myeloma in relapse: Secondary | ICD-10-CM | POA: Diagnosis not present

## 2024-01-16 LAB — CMP (CANCER CENTER ONLY)
ALT: 12 U/L (ref 0–44)
AST: 18 U/L (ref 15–41)
Albumin: 4 g/dL (ref 3.5–5.0)
Alkaline Phosphatase: 80 U/L (ref 38–126)
Anion gap: 7 (ref 5–15)
BUN: 18 mg/dL (ref 8–23)
CO2: 28 mmol/L (ref 22–32)
Calcium: 8.8 mg/dL — ABNORMAL LOW (ref 8.9–10.3)
Chloride: 106 mmol/L (ref 98–111)
Creatinine: 0.81 mg/dL (ref 0.44–1.00)
GFR, Estimated: >60 mL/min (ref >60)
Glucose, Bld: 107 mg/dL — ABNORMAL HIGH (ref 70–99)
Potassium: 3.8 mmol/L (ref 3.5–5.1)
Sodium: 141 mmol/L (ref 135–145)
Total Bilirubin: 0.4 mg/dL (ref 0.0–1.2)
Total Protein: 6.4 g/dL — ABNORMAL LOW (ref 6.5–8.1)

## 2024-01-16 LAB — CBC WITH DIFFERENTIAL (CANCER CENTER ONLY)
Abs Immature Granulocytes: 0.02 K/uL (ref 0.00–0.07)
Basophils Absolute: 0 K/uL (ref 0.0–0.1)
Basophils Relative: 1 %
Eosinophils Absolute: 0 K/uL (ref 0.0–0.5)
Eosinophils Relative: 0 %
HCT: 36.9 % (ref 36.0–46.0)
Hemoglobin: 12.6 g/dL (ref 12.0–15.0)
Immature Granulocytes: 1 %
Lymphocytes Relative: 25 %
Lymphs Abs: 0.8 K/uL (ref 0.7–4.0)
MCH: 34.9 pg — ABNORMAL HIGH (ref 26.0–34.0)
MCHC: 34.1 g/dL (ref 30.0–36.0)
MCV: 102.2 fL — ABNORMAL HIGH (ref 80.0–100.0)
Monocytes Absolute: 0.5 K/uL (ref 0.1–1.0)
Monocytes Relative: 15 %
Neutro Abs: 1.9 K/uL (ref 1.7–7.7)
Neutrophils Relative %: 58 %
Platelet Count: 144 K/uL — ABNORMAL LOW (ref 150–400)
RBC: 3.61 MIL/uL — ABNORMAL LOW (ref 3.87–5.11)
RDW: 13.9 % (ref 11.5–15.5)
WBC Count: 3.3 K/uL — ABNORMAL LOW (ref 4.0–10.5)
nRBC: 0 % (ref 0.0–0.2)

## 2024-01-16 MED ORDER — ELRANATAMAB-BCMM 76 MG/1.9ML ~~LOC~~ SOLN
76.0000 mg | Freq: Once | SUBCUTANEOUS | Status: AC
  Administered 2024-01-16: 76 mg via SUBCUTANEOUS
  Filled 2024-01-16: qty 1.9

## 2024-01-16 NOTE — Patient Instructions (Signed)
 [facility] - A DEPT OF [facility]  Discharge Instructions: Thank you for choosing [facility] to provide your oncology and hematology care.   If you have a lab appointment with the Cancer Center, please go directly to the Cancer Center and check in at the registration area.   Wear comfortable clothing and clothing appropriate for easy access to any Portacath or PICC line.   We strive to give you quality time with your provider. You may need to reschedule your appointment if you arrive late (15 or more minutes).  Arriving late affects you and other patients whose appointments are after yours.  Also, if you miss three or more appointments without notifying the office, you may be dismissed from the clinic at the provider's discretion.      For prescription refill requests, have your pharmacy contact our office and allow 72 hours for refills to be completed.    Today you received the following chemotherapy and/or immunotherapy agents: Elrexfio .      To help prevent nausea and vomiting after your treatment, we encourage you to take your nausea medication as directed.  BELOW ARE SYMPTOMS THAT SHOULD BE REPORTED IMMEDIATELY: *FEVER GREATER THAN 100.4 F (38 C) OR HIGHER *CHILLS OR SWEATING *NAUSEA AND VOMITING THAT IS NOT CONTROLLED WITH YOUR NAUSEA MEDICATION *UNUSUAL SHORTNESS OF BREATH *UNUSUAL BRUISING OR BLEEDING *URINARY PROBLEMS (pain or burning when urinating, or frequent urination) *BOWEL PROBLEMS (unusual diarrhea, constipation, pain near the anus) TENDERNESS IN MOUTH AND THROAT WITH OR WITHOUT PRESENCE OF ULCERS (sore throat, sores in mouth, or a toothache) UNUSUAL RASH, SWELLING OR PAIN  UNUSUAL VAGINAL DISCHARGE OR ITCHING   Items with * indicate a potential emergency and should be followed up as soon as possible or go to the Emergency Department if any problems should occur.  Please show the CHEMOTHERAPY ALERT CARD or IMMUNOTHERAPY  ALERT CARD at check-in to the Emergency Department and triage nurse.  Should you have questions after your visit or need to cancel or reschedule your appointment, please contact [facility] - [facility]  Dept: [phone]  and follow the prompts.  Office hours are 8:00 a.m. to 4:30 p.m. Monday - Friday. Please note that voicemails left after 4:00 p.m. may not be returned until the following business day.  We are closed weekends and major holidays. You have access to a nurse at all times for urgent questions. Please call the main number to the clinic Dept: [phone] and follow the prompts.   For any non-urgent questions, you may also contact your provider using MyChart. We now offer e-Visits for anyone 64 and older to request care online for non-urgent symptoms. For details visit [email or web address].   Also download the MyChart app! Go to the app store, search MyChart, open the app, select [place], and log in with your MyChart username and password.

## 2024-01-18 ENCOUNTER — Inpatient Hospital Stay: Admitting: Licensed Clinical Social Worker

## 2024-01-18 DIAGNOSIS — C9002 Multiple myeloma in relapse: Secondary | ICD-10-CM

## 2024-01-18 NOTE — Progress Notes (Signed)
 [facility] Advance Directives Clinical Social Work  Patient presented to Advance Directives Clinic  to review and complete healthcare advance directives.  Clinical Social Worker met with patient.  The patient designated Jean Davidson (daughter [phone]) as their primary healthcare agent and Jean Davidson (brother [phone]) as their secondary agent.  Patient also completed healthcare living will.    Documents were notarized and copies made for patient/family. Clinical Social Worker will send documents to medical records to be scanned into patient's chart. Clinical Social Worker encouraged patient/family to contact with any additional questions or concerns.   [name], LCSW Clinical Social Worker [facility]

## 2024-01-20 ENCOUNTER — Encounter: Payer: Self-pay | Admitting: Hematology and Oncology

## 2024-01-21 ENCOUNTER — Encounter: Payer: Self-pay | Admitting: Hematology and Oncology

## 2024-01-23 ENCOUNTER — Inpatient Hospital Stay

## 2024-01-23 VITALS — BP 136/55 | HR 63 | Temp 98.4°F | Resp 18 | Wt 162.2 lb

## 2024-01-23 DIAGNOSIS — C9002 Multiple myeloma in relapse: Secondary | ICD-10-CM

## 2024-01-23 LAB — CBC WITH DIFFERENTIAL (CANCER CENTER ONLY)
Abs Immature Granulocytes: 0.03 K/uL (ref 0.00–0.07)
Basophils Absolute: 0 K/uL (ref 0.0–0.1)
Basophils Relative: 1 %
Eosinophils Absolute: 0 K/uL (ref 0.0–0.5)
Eosinophils Relative: 0 %
HCT: 37.3 % (ref 36.0–46.0)
Hemoglobin: 12.2 g/dL (ref 12.0–15.0)
Immature Granulocytes: 1 %
Lymphocytes Relative: 25 %
Lymphs Abs: 0.8 K/uL (ref 0.7–4.0)
MCH: 33.8 pg (ref 26.0–34.0)
MCHC: 32.7 g/dL (ref 30.0–36.0)
MCV: 103.3 fL — ABNORMAL HIGH (ref 80.0–100.0)
Monocytes Absolute: 0.5 K/uL (ref 0.1–1.0)
Monocytes Relative: 15 %
Neutro Abs: 1.9 K/uL (ref 1.7–7.7)
Neutrophils Relative %: 58 %
Platelet Count: 134 K/uL — ABNORMAL LOW (ref 150–400)
RBC: 3.61 MIL/uL — ABNORMAL LOW (ref 3.87–5.11)
RDW: 13.7 % (ref 11.5–15.5)
WBC Count: 3.2 K/uL — ABNORMAL LOW (ref 4.0–10.5)
nRBC: 0 % (ref 0.0–0.2)

## 2024-01-23 LAB — CMP (CANCER CENTER ONLY)
ALT: 12 U/L (ref 0–44)
AST: 19 U/L (ref 15–41)
Albumin: 3.9 g/dL (ref 3.5–5.0)
Alkaline Phosphatase: 78 U/L (ref 38–126)
Anion gap: 6 (ref 5–15)
BUN: 17 mg/dL (ref 8–23)
CO2: 28 mmol/L (ref 22–32)
Calcium: 8.8 mg/dL — ABNORMAL LOW (ref 8.9–10.3)
Chloride: 108 mmol/L (ref 98–111)
Creatinine: 0.84 mg/dL (ref 0.44–1.00)
GFR, Estimated: >60 mL/min (ref >60)
Glucose, Bld: 99 mg/dL (ref 70–99)
Potassium: 3.8 mmol/L (ref 3.5–5.1)
Sodium: 142 mmol/L (ref 135–145)
Total Bilirubin: 0.4 mg/dL (ref 0.0–1.2)
Total Protein: 6.3 g/dL — ABNORMAL LOW (ref 6.5–8.1)

## 2024-01-23 MED ORDER — ELRANATAMAB-BCMM 76 MG/1.9ML ~~LOC~~ SOLN
76.0000 mg | Freq: Once | SUBCUTANEOUS | Status: AC
  Administered 2024-01-23: 76 mg via SUBCUTANEOUS
  Filled 2024-01-23: qty 1.9

## 2024-01-23 NOTE — Patient Instructions (Signed)
 [facility] - A DEPT OF [facility]  Discharge Instructions: Thank you for choosing [facility] to provide your oncology and hematology care.   If you have a lab appointment with the Cancer Center, please go directly to the Cancer Center and check in at the registration area.   Wear comfortable clothing and clothing appropriate for easy access to any Portacath or PICC line.   We strive to give you quality time with your provider. You may need to reschedule your appointment if you arrive late (15 or more minutes).  Arriving late affects you and other patients whose appointments are after yours.  Also, if you miss three or more appointments without notifying the office, you may be dismissed from the clinic at the provider's discretion.      For prescription refill requests, have your pharmacy contact our office and allow 72 hours for refills to be completed.    Today you received the following chemotherapy and/or immunotherapy agents: Elrexfio .      To help prevent nausea and vomiting after your treatment, we encourage you to take your nausea medication as directed.  BELOW ARE SYMPTOMS THAT SHOULD BE REPORTED IMMEDIATELY: *FEVER GREATER THAN 100.4 F (38 C) OR HIGHER *CHILLS OR SWEATING *NAUSEA AND VOMITING THAT IS NOT CONTROLLED WITH YOUR NAUSEA MEDICATION *UNUSUAL SHORTNESS OF BREATH *UNUSUAL BRUISING OR BLEEDING *URINARY PROBLEMS (pain or burning when urinating, or frequent urination) *BOWEL PROBLEMS (unusual diarrhea, constipation, pain near the anus) TENDERNESS IN MOUTH AND THROAT WITH OR WITHOUT PRESENCE OF ULCERS (sore throat, sores in mouth, or a toothache) UNUSUAL RASH, SWELLING OR PAIN  UNUSUAL VAGINAL DISCHARGE OR ITCHING   Items with * indicate a potential emergency and should be followed up as soon as possible or go to the Emergency Department if any problems should occur.  Please show the CHEMOTHERAPY ALERT CARD or IMMUNOTHERAPY  ALERT CARD at check-in to the Emergency Department and triage nurse.  Should you have questions after your visit or need to cancel or reschedule your appointment, please contact [facility] - [facility]  Dept: [phone]  and follow the prompts.  Office hours are 8:00 a.m. to 4:30 p.m. Monday - Friday. Please note that voicemails left after 4:00 p.m. may not be returned until the following business day.  We are closed weekends and major holidays. You have access to a nurse at all times for urgent questions. Please call the main number to the clinic Dept: [phone] and follow the prompts.   For any non-urgent questions, you may also contact your provider using MyChart. We now offer e-Visits for anyone 64 and older to request care online for non-urgent symptoms. For details visit [email or web address].   Also download the MyChart app! Go to the app store, search MyChart, open the app, select [place], and log in with your MyChart username and password.

## 2024-01-27 ENCOUNTER — Other Ambulatory Visit: Payer: Self-pay | Admitting: Hematology and Oncology

## 2024-01-28 ENCOUNTER — Encounter: Payer: Self-pay | Admitting: Hematology and Oncology

## 2024-01-30 ENCOUNTER — Inpatient Hospital Stay: Attending: Hematology and Oncology

## 2024-01-30 ENCOUNTER — Other Ambulatory Visit: Payer: Self-pay | Admitting: *Deleted

## 2024-01-30 ENCOUNTER — Inpatient Hospital Stay

## 2024-01-30 ENCOUNTER — Encounter: Payer: Self-pay | Admitting: Hematology and Oncology

## 2024-01-30 VITALS — BP 147/69 | HR 61 | Temp 97.9°F | Resp 16 | Wt 162.0 lb

## 2024-01-30 DIAGNOSIS — C9002 Multiple myeloma in relapse: Secondary | ICD-10-CM

## 2024-01-30 DIAGNOSIS — Z9481 Bone marrow transplant status: Secondary | ICD-10-CM | POA: Insufficient documentation

## 2024-01-30 DIAGNOSIS — Z800 Family history of malignant neoplasm of digestive organs: Secondary | ICD-10-CM | POA: Diagnosis not present

## 2024-01-30 DIAGNOSIS — Z87891 Personal history of nicotine dependence: Secondary | ICD-10-CM | POA: Insufficient documentation

## 2024-01-30 DIAGNOSIS — Z9484 Stem cells transplant status: Secondary | ICD-10-CM | POA: Insufficient documentation

## 2024-01-30 DIAGNOSIS — C9001 Multiple myeloma in remission: Secondary | ICD-10-CM | POA: Insufficient documentation

## 2024-01-30 DIAGNOSIS — Z808 Family history of malignant neoplasm of other organs or systems: Secondary | ICD-10-CM | POA: Insufficient documentation

## 2024-01-30 LAB — CBC WITH DIFFERENTIAL (CANCER CENTER ONLY)
Abs Immature Granulocytes: 0.02 K/uL (ref 0.00–0.07)
Basophils Absolute: 0 K/uL (ref 0.0–0.1)
Basophils Relative: 1 %
Eosinophils Absolute: 0 K/uL (ref 0.0–0.5)
Eosinophils Relative: 0 %
HCT: 36.2 % (ref 36.0–46.0)
Hemoglobin: 12.2 g/dL (ref 12.0–15.0)
Immature Granulocytes: 1 %
Lymphocytes Relative: 26 %
Lymphs Abs: 0.7 K/uL (ref 0.7–4.0)
MCH: 34 pg (ref 26.0–34.0)
MCHC: 33.7 g/dL (ref 30.0–36.0)
MCV: 100.8 fL — ABNORMAL HIGH (ref 80.0–100.0)
Monocytes Absolute: 0.5 K/uL (ref 0.1–1.0)
Monocytes Relative: 18 %
Neutro Abs: 1.5 K/uL — ABNORMAL LOW (ref 1.7–7.7)
Neutrophils Relative %: 54 %
Platelet Count: 127 K/uL — ABNORMAL LOW (ref 150–400)
RBC: 3.59 MIL/uL — ABNORMAL LOW (ref 3.87–5.11)
RDW: 13 % (ref 11.5–15.5)
WBC Count: 2.7 K/uL — ABNORMAL LOW (ref 4.0–10.5)
nRBC: 0 % (ref 0.0–0.2)

## 2024-01-30 LAB — CMP (CANCER CENTER ONLY)
ALT: 11 U/L (ref 0–44)
AST: 16 U/L (ref 15–41)
Albumin: 4.2 g/dL (ref 3.5–5.0)
Alkaline Phosphatase: 82 U/L (ref 38–126)
Anion gap: 6 (ref 5–15)
BUN: 14 mg/dL (ref 8–23)
CO2: 29 mmol/L (ref 22–32)
Calcium: 8.9 mg/dL (ref 8.9–10.3)
Chloride: 107 mmol/L (ref 98–111)
Creatinine: 0.73 mg/dL (ref 0.44–1.00)
GFR, Estimated: >60 mL/min (ref >60)
Glucose, Bld: 102 mg/dL — ABNORMAL HIGH (ref 70–99)
Potassium: 3.6 mmol/L (ref 3.5–5.1)
Sodium: 142 mmol/L (ref 135–145)
Total Bilirubin: 0.4 mg/dL (ref 0.0–1.2)
Total Protein: 6.4 g/dL — ABNORMAL LOW (ref 6.5–8.1)

## 2024-01-30 MED ORDER — ELRANATAMAB-BCMM 76 MG/1.9ML ~~LOC~~ SOLN
76.0000 mg | Freq: Once | SUBCUTANEOUS | Status: AC
  Administered 2024-01-30: 76 mg via SUBCUTANEOUS
  Filled 2024-01-30: qty 1.9

## 2024-01-30 NOTE — Patient Instructions (Signed)
 [facility] - A DEPT OF [facility]  Discharge Instructions: Thank you for choosing [facility] to provide your oncology and hematology care.   If you have a lab appointment with the Cancer Center, please go directly to the Cancer Center and check in at the registration area.   Wear comfortable clothing and clothing appropriate for easy access to any Portacath or PICC line.   We strive to give you quality time with your provider. You may need to reschedule your appointment if you arrive late (15 or more minutes).  Arriving late affects you and other patients whose appointments are after yours.  Also, if you miss three or more appointments without notifying the office, you may be dismissed from the clinic at the provider's discretion.      For prescription refill requests, have your pharmacy contact our office and allow 72 hours for refills to be completed.    Today you received the following chemotherapy and/or immunotherapy agents: Elrexfio .      To help prevent nausea and vomiting after your treatment, we encourage you to take your nausea medication as directed.  BELOW ARE SYMPTOMS THAT SHOULD BE REPORTED IMMEDIATELY: *FEVER GREATER THAN 100.4 F (38 C) OR HIGHER *CHILLS OR SWEATING *NAUSEA AND VOMITING THAT IS NOT CONTROLLED WITH YOUR NAUSEA MEDICATION *UNUSUAL SHORTNESS OF BREATH *UNUSUAL BRUISING OR BLEEDING *URINARY PROBLEMS (pain or burning when urinating, or frequent urination) *BOWEL PROBLEMS (unusual diarrhea, constipation, pain near the anus) TENDERNESS IN MOUTH AND THROAT WITH OR WITHOUT PRESENCE OF ULCERS (sore throat, sores in mouth, or a toothache) UNUSUAL RASH, SWELLING OR PAIN  UNUSUAL VAGINAL DISCHARGE OR ITCHING   Items with * indicate a potential emergency and should be followed up as soon as possible or go to the Emergency Department if any problems should occur.  Please show the CHEMOTHERAPY ALERT CARD or IMMUNOTHERAPY  ALERT CARD at check-in to the Emergency Department and triage nurse.  Should you have questions after your visit or need to cancel or reschedule your appointment, please contact [facility] - [facility]  Dept: [phone]  and follow the prompts.  Office hours are 8:00 a.m. to 4:30 p.m. Monday - Friday. Please note that voicemails left after 4:00 p.m. may not be returned until the following business day.  We are closed weekends and major holidays. You have access to a nurse at all times for urgent questions. Please call the main number to the clinic Dept: [phone] and follow the prompts.   For any non-urgent questions, you may also contact your provider using MyChart. We now offer e-Visits for anyone 64 and older to request care online for non-urgent symptoms. For details visit [email or web address].   Also download the MyChart app! Go to the app store, search MyChart, open the app, select [place], and log in with your MyChart username and password.

## 2024-01-31 ENCOUNTER — Encounter: Payer: Self-pay | Admitting: Hematology and Oncology

## 2024-01-31 LAB — KAPPA/LAMBDA LIGHT CHAINS
Kappa free light chain: 1.3 mg/L — ABNORMAL LOW (ref 3.3–19.4)
Kappa, lambda light chain ratio: >0.87 (ref 0.26–1.65)
Lambda free light chains: <1.5 mg/L — ABNORMAL LOW (ref 5.7–26.3)

## 2024-02-04 ENCOUNTER — Other Ambulatory Visit: Payer: Self-pay

## 2024-02-04 LAB — MULTIPLE MYELOMA PANEL, SERUM
Albumin SerPl Elph-Mcnc: 3.6 g/dL (ref 2.9–4.4)
Albumin/Glob SerPl: 1.6 (ref 0.7–1.7)
Alpha 1: 0.3 g/dL (ref 0.0–0.4)
Alpha2 Glob SerPl Elph-Mcnc: 0.8 g/dL (ref 0.4–1.0)
B-Globulin SerPl Elph-Mcnc: 0.9 g/dL (ref 0.7–1.3)
Gamma Glob SerPl Elph-Mcnc: 0.4 g/dL (ref 0.4–1.8)
Globulin, Total: 2.3 g/dL (ref 2.2–3.9)
IgA: <5 mg/dL — ABNORMAL LOW (ref 64–422)
IgG (Immunoglobin G), Serum: 422 mg/dL — ABNORMAL LOW (ref 586–1602)
IgM (Immunoglobulin M), Srm: <5 mg/dL — ABNORMAL LOW (ref 26–217)
Total Protein ELP: 5.9 g/dL — ABNORMAL LOW (ref 6.0–8.5)

## 2024-02-06 ENCOUNTER — Inpatient Hospital Stay

## 2024-02-06 ENCOUNTER — Inpatient Hospital Stay: Admitting: Hematology and Oncology

## 2024-02-06 VITALS — HR 98

## 2024-02-06 VITALS — BP 157/76 | HR 119 | Temp 97.4°F | Resp 17 | Wt 161.9 lb

## 2024-02-06 DIAGNOSIS — C9002 Multiple myeloma in relapse: Secondary | ICD-10-CM

## 2024-02-06 DIAGNOSIS — C9001 Multiple myeloma in remission: Secondary | ICD-10-CM | POA: Diagnosis not present

## 2024-02-06 LAB — CBC WITH DIFFERENTIAL (CANCER CENTER ONLY)
Abs Immature Granulocytes: 0.02 K/uL (ref 0.00–0.07)
Basophils Absolute: 0 K/uL (ref 0.0–0.1)
Basophils Relative: 1 %
Eosinophils Absolute: 0 K/uL (ref 0.0–0.5)
Eosinophils Relative: 0 %
HCT: 35.1 % — ABNORMAL LOW (ref 36.0–46.0)
Hemoglobin: 11.8 g/dL — ABNORMAL LOW (ref 12.0–15.0)
Immature Granulocytes: 1 %
Lymphocytes Relative: 23 %
Lymphs Abs: 0.8 K/uL (ref 0.7–4.0)
MCH: 34.1 pg — ABNORMAL HIGH (ref 26.0–34.0)
MCHC: 33.6 g/dL (ref 30.0–36.0)
MCV: 101.4 fL — ABNORMAL HIGH (ref 80.0–100.0)
Monocytes Absolute: 0.5 K/uL (ref 0.1–1.0)
Monocytes Relative: 14 %
Neutro Abs: 2.1 K/uL (ref 1.7–7.7)
Neutrophils Relative %: 61 %
Platelet Count: 111 K/uL — ABNORMAL LOW (ref 150–400)
RBC: 3.46 MIL/uL — ABNORMAL LOW (ref 3.87–5.11)
RDW: 13 % (ref 11.5–15.5)
WBC Count: 3.4 K/uL — ABNORMAL LOW (ref 4.0–10.5)
nRBC: 0 % (ref 0.0–0.2)

## 2024-02-06 LAB — CMP (CANCER CENTER ONLY)
ALT: 9 U/L (ref 0–44)
AST: 16 U/L (ref 15–41)
Albumin: 4.2 g/dL (ref 3.5–5.0)
Alkaline Phosphatase: 86 U/L (ref 38–126)
Anion gap: 5 (ref 5–15)
BUN: 18 mg/dL (ref 8–23)
CO2: 30 mmol/L (ref 22–32)
Calcium: 9 mg/dL (ref 8.9–10.3)
Chloride: 106 mmol/L (ref 98–111)
Creatinine: 0.78 mg/dL (ref 0.44–1.00)
GFR, Estimated: >60 mL/min (ref >60)
Glucose, Bld: 105 mg/dL — ABNORMAL HIGH (ref 70–99)
Potassium: 3.9 mmol/L (ref 3.5–5.1)
Sodium: 141 mmol/L (ref 135–145)
Total Bilirubin: 0.4 mg/dL (ref 0.0–1.2)
Total Protein: 6.4 g/dL — ABNORMAL LOW (ref 6.5–8.1)

## 2024-02-06 MED ORDER — ELRANATAMAB-BCMM 76 MG/1.9ML ~~LOC~~ SOLN
76.0000 mg | Freq: Once | SUBCUTANEOUS | Status: AC
  Administered 2024-02-06 (×2): 76 mg via SUBCUTANEOUS
  Filled 2024-02-06: qty 1.9

## 2024-02-06 NOTE — Progress Notes (Signed)
 [facility] [phone]   [phone]  PROGRESS NOTE  Patient Care Team: Patient, [name] as PCP - General (General Practice) Darron Deatrice LABOR, MD as PCP - Cardiology (Cardiology) Darren Liborio Backers, MD as Referring Physician (Hematology and Oncology) Guinevere File, MD as Consulting Physician (Internal Medicine) Fleeta Rothman, Jomarie SAILOR, MD as Consulting Physician (Infectious Diseases) Marda Maranda Foots An, MD as Consulting Physician (Hematology and Oncology) Honora Lenon Irving, NP as Nurse Practitioner (Hematology and Oncology) Lonn Hicks, MD as Consulting Physician (Hematology and Oncology)  Hematological/Oncological History # Relapsed/Refractory IgA Lambda Multiple Myeloma, In Remission 2016: Underwent autologous stem cell transplant 2017: Underwent allogenic stem cell transplant 10/16/2022: Completed CAR-T therapy with [facility] 09/2023: Started on Elrexfio due to progression of disease 01/02/2024: Last visit at [facility] where induction therapy was completed 01/09/2024: establish care with Dr. Federico.   Interval History:  Jean Davidson 78 y.o. female with medical history significant for relapsed/refractory multiple myeloma who presents for a follow up visit. The patient's last visit was on 01/09/2024 and since then she has continued bispecific therapy. In the interim since the last visit she has successfully begun maintenance Elrexfio at [facility].    Ms. Momon reports she is upset because she recently lost her Fitbit.  She is up sets and also stressed out from walking up the hill.  She notes that she has been feeling little more tired and fatigued and her heart rate is elevated today.  She notes that she is also frustrated with her increase in weight.  She reports that she is still trying to find foods that taste good as the treatment appears to have altered her taste buds.  She notes that she has not had any issues with  nausea or vomiting but has been having loose stools consistent with diarrhea.  She reports she still try to figure out which foods are least upsetting to her stomach.  She has had no fever or chills but is having some occasional sweats.  She denies any bleeding, bruising, or dark stools.  She is not having any runny nose, sore throat, cough.  Full 10 point ROS is otherwise negative.  Overall she is willing and able to continue bispecific therapy at this time.   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   Multiple myeloma (HCC)    Osteopenia    Pinched nerve    Sinusitis, bacterial 07/27/2015   Spinal stenosis in cervical region    Varicose veins of lower extremities with inflammation     SURGICAL HISTORY: Past Surgical History:  Procedure Laterality Date   ANKLE SURGERY Left    BLADDER SUSPENSION  1988/1989   x2   COLONOSCOPY  ESOPHAGOGASTRODUODENOSCOPY     IR IMAGING GUIDED PORT INSERTION  03/01/2018   LIMBAL STEM CELL TRANSPLANT     x2   PORT-A-CATH REMOVAL     SHOULDER SURGERY Right 04/06/2013   right   UPPER GASTROINTESTINAL ENDOSCOPY      SOCIAL HISTORY: Social History   Socioeconomic History   Marital status: Divorced    Spouse name: Not on file   Number of children: Not on file   Years of education: Not on file   Highest education  level: Not on file  Occupational History   Not on file  Tobacco Use   Smoking status: Former    Current packs/day: 0.00    Average packs/day: 0.5 packs/day for 4.0 years (2.0 ttl pk-yrs)    Types: Cigarettes    Start date: 06/26/1964    Quit date: 06/26/1968    Years since quitting: 55.6   Smokeless tobacco: Never  Vaping Use   Vaping status: Never Used  Substance and Sexual Activity   Alcohol  use: Yes    Alcohol /week: 1.0 standard drink of alcohol     Types: 1 Cans of beer per week    Comment: 1 per week per pt   Drug use: No   Sexual activity: Not on file  Other Topics Concern   Not on file  Social History Narrative   [name] is daughter n[place]ka, [place] will,  full code ( reviewed 50)   Social Drivers of Health   Financial Resource Strain: Low Risk  (10/09/2023)   Received from [facility]   Overall Financial Resource Strain (CARDIA)    Difficulty of Paying Living Expenses: Not hard at all  Food Insecurity: No Food Insecurity (10/09/2023)   Received from [facility]   Hunger Vital Sign    Within the past 12 months, you worried that your food would run out before you got the money to buy more.: Never true    Within the past 12 months, the food you bought just didn't last and you didn't have money to get more.: Never true  Transportation Needs: Unknown (10/09/2023)   Received from [facility] - Transportation    In the past 12 months, has lack of transportation kept you from medical appointments or from getting medications?: No    Lack of Transportation (Non-Medical): Not on file  Physical Activity: Not on file  Stress: Not on file  Social Connections: Not on file  Intimate Partner Violence: Not on file    FAMILY HISTORY: Family History  Problem Relation Age of Onset   Heart disease Mother    Heart failure Father    Heart disease Father    Throat cancer Father    Skin cancer Father    Diabetes  Father    Kidney disease Father    Hypertension Brother    Heart disease Brother        congenital shunt, ?    Colon cancer Paternal Aunt 73   Skin cancer Brother    Stomach cancer Neg Hx    Esophageal cancer Neg Hx    Rectal cancer Neg Hx     ALLERGIES:  has no known allergies.  MEDICATIONS:  Current Outpatient Medications  Medication Sig Dispense Refill   acyclovir  (ZOVIRAX ) 400 MG tablet Take 1 tablet (400 mg total) by mouth 2 (two) times daily. 180 tablet 1   aspirin EC 81 MG tablet Take 81 mg by mouth daily. Swallow whole.  atovaquone (MEPRON) 750 MG/5ML suspension Take 1,500 mg by mouth 2 (two) times daily.     cholecalciferol (VITAMIN D3) 25 MCG (1000 UNIT) tablet Take 4,000 Units by mouth daily. Instructed to increase to 4,000 units daily     metoprolol tartrate (LOPRESSOR) 25 MG tablet TAKE 1 & 1/2 TABLET BY MOUTH TWICE A DAY--due for follow up appointment. 270 tablet 3   mirtazapine (REMERON) 15 MG tablet TAKE 1 TABLET BY MOUTH EVERYDAY AT BEDTIME 90 tablet 1   Multiple Vitamins-Minerals (PRESERVISION AREDS 2 PO) Take 1 tablet by mouth 2 (two) times daily.     ondansetron (ZOFRAN) 8 MG tablet Take 1 tablet (8 mg total) by mouth every 8 (eight) hours as needed (Nausea or vomiting). (Patient taking differently: Take 8 mg by mouth 2 (two) times daily.) 60 tablet 1   pantoprazole (PROTONIX) 40 MG tablet TAKE 1 TABLET BY MOUTH 2 TIMES DAILY. TAKE 30-60 MINUTES BEFORE BREAKFAST AND DINNER. 180 tablet 0   prochlorperazine (COMPAZINE) 5 MG tablet Take 5 mg by mouth every 6 (six) hours as needed for nausea or vomiting.     senna-docusate (SENOKOT-S) 8.6-50 MG tablet Take 2 tablets by mouth daily as needed.     No current facility-administered medications for this visit.    REVIEW OF SYSTEMS:   Constitutional: ( - ) fevers, ( - )  chills , ( - ) night sweats Eyes: ( - ) blurriness of vision, ( - ) double vision, ( - ) watery eyes Ears, nose, mouth, throat, and face: ( - )  mucositis, ( - ) sore throat Respiratory: ( - ) cough, ( - ) dyspnea, ( - ) wheezes Cardiovascular: ( - ) palpitation, ( - ) chest discomfort, ( - ) lower extremity swelling Gastrointestinal:  ( - ) nausea, ( - ) heartburn, ( - ) change in bowel habits Skin: ( - ) abnormal skin rashes Lymphatics: ( - ) new lymphadenopathy, ( - ) easy bruising Neurological: ( - ) numbness, ( - ) tingling, ( - ) new weaknesses Behavioral/Psych: ( - ) mood change, ( - ) new changes  All other systems were reviewed with the patient and are negative.  PHYSICAL EXAMINATION: ECOG PERFORMANCE STATUS: 1 - Symptomatic but completely ambulatory  Vitals:   02/06/24 1023  BP: (!) 157/76  Pulse: (!) 119  Resp: 17  Temp: (!) 97.4 F (36.3 C)  SpO2: 99%    Filed Weights   02/06/24 1023  Weight: 161 lb 14.4 oz (73.4 kg)     GENERAL: Well-appearing elderly Caucasian female, alert, no distress and comfortable SKIN: skin color, texture, turgor are normal, no rashes or significant lesions EYES: conjunctiva are pink and non-injected, sclera clear LUNGS: clear to auscultation and percussion with normal breathing effort HEART: regular rate & rhythm and no murmurs and no lower extremity edema Musculoskeletal: no cyanosis of digits and no clubbing  PSYCH: alert & oriented x 3, fluent speech NEURO: no focal motor/sensory deficits  LABORATORY DATA:  I have reviewed the data as listed    Latest Ref Rng & Units 02/06/2024    9:54 AM 01/30/2024    8:17 AM 01/23/2024    8:03 AM  CBC  WBC 4.0 - 10.5 K/uL 3.4  2.7  3.2   Hemoglobin 12.0 - 15.0 g/dL 88.1  87.7  87.7   Hematocrit 36.0 - 46.0 % 35.1  36.2  37.3   Platelets 150 - 400 K/uL 111  127  134  Latest Ref Rng & Units 02/06/2024    9:54 AM 01/30/2024    8:17 AM 01/23/2024    8:03 AM  CMP  Glucose 70 - 99 mg/dL 894  897  99   BUN 8 - 23 mg/dL 18  14  17    Creatinine 0.44 - 1.00 mg/dL 9.21  9.26  9.15   Sodium 135 - 145 mmol/L 141  142  142   Potassium  3.5 - 5.1 mmol/L 3.9  3.6  3.8   Chloride 98 - 111 mmol/L 106  107  108   CO2 22 - 32 mmol/L 30  29  28    Calcium  8.9 - 10.3 mg/dL 9.0  8.9  8.8   Total Protein 6.5 - 8.1 g/dL 6.4  6.4  6.3   Total Bilirubin 0.0 - 1.2 mg/dL 0.4  0.4  0.4   Alkaline Phos 38 - 126 U/L 86  82  78   AST 15 - 41 U/L 16  16  19    ALT 0 - 44 U/L 9  11  12      Lab Results  Component Value Date   MPROTEIN Not Observed 01/30/2024   MPROTEIN 0.5 (H) 09/25/2023   MPROTEIN 0.5 (H) 08/31/2023   Lab Results  Component Value Date   KPAFRELGTCHN 1.3 (L) 01/30/2024   KPAFRELGTCHN 10.1 09/25/2023   KPAFRELGTCHN 12.1 08/31/2023   LAMBDASER <1.5 (L) 01/30/2024   LAMBDASER 133.2 (H) 09/25/2023   LAMBDASER 98.2 (H) 08/31/2023   KAPLAMBRATIO >0.87 01/30/2024   KAPLAMBRATIO 0.08 (L) 09/25/2023   KAPLAMBRATIO 0.12 (L) 08/31/2023     RADIOGRAPHIC STUDIES: No results found.  ASSESSMENT & PLAN ADDALEIGH NICHOLLS 78 y.o. female with medical history significant for relapsed/refractory multiple myeloma who presents for a follow up visit.   # Relapsed/Refractory IgA Lambda Multiple Myeloma, In Remission -- Patient is undergone numerous prior therapies including autosomal stem cell transplant, allogenic stem cell transplant, CAR-T therapy, and now bispecific therapy --Patient completed her induction of bispecific therapy at [facility] -- Patient presents today to begin maintenance therapy. --Labs today show white blood cell 3.3, hemoglobin 12.6, MCV 102.2, platelets 144.  LFTs and creatinine adequate for continued treatment -- Will plan for every 2 week maintenance Elrexfio  with clinic visits every other treatment.  #Supportive Care -- zofran  8mg  q8H PRN and compazine  10mg  PO q6H for nausea -- acyclovir  400mg  PO BID for VCZ prophylaxis --Continue atovaquone 1500 mg twice daily -- IVIG to be administered when IgG are less than 400 -- EMLA  cream for port -- no pain medication required at this time.     Orders Placed This Encounter  Procedures   Magnesium     Standing Status:   Future    Expiration Date:   02/05/2025   CBC with Differential (Cancer Center Only)    Standing Status:   Future    Expected Date:   02/13/2024    Expiration Date:   02/12/2025   CMP (Cancer Center only)    Standing Status:   Future    Expected Date:   02/13/2024    Expiration Date:   02/12/2025   CBC with Differential (Cancer Center Only)    Standing Status:   Future    Expected Date:   02/20/2024    Expiration Date:   02/19/2025   CMP (Cancer Center only)    Standing Status:   Future    Expected Date:   02/20/2024    Expiration Date:   02/19/2025  CBC with Differential (Cancer Center Only)    Standing Status:   Future    Expected Date:   02/27/2024    Expiration Date:   02/26/2025   CMP (Cancer Center only)    Standing Status:   Future    Expected Date:   02/27/2024    Expiration Date:   02/26/2025   CBC with Differential (Cancer Center Only)    Standing Status:   Future    Expected Date:   03/05/2024    Expiration Date:   03/05/2025   CMP (Cancer Center only)    Standing Status:   Future    Expected Date:   03/05/2024    Expiration Date:   03/05/2025   Magnesium     Standing Status:   Future    Expected Date:   02/13/2024    Expiration Date:   02/12/2025   Lactate dehydrogenase (LDH)    Standing Status:   Future    Expected Date:   02/13/2024    Expiration Date:   02/12/2025   Magnesium     Standing Status:   Future    Expected Date:   02/20/2024    Expiration Date:   02/19/2025   Lactate dehydrogenase (LDH)    Standing Status:   Future    Expected Date:   02/20/2024    Expiration Date:   02/19/2025   Magnesium     Standing Status:   Future    Expected Date:   02/27/2024    Expiration Date:   02/26/2025   Lactate dehydrogenase (LDH)    Standing Status:   Future    Expected Date:   02/27/2024    Expiration Date:   02/26/2025   Magnesium     Standing Status:   Future    Expected Date:   03/05/2024    Expiration  Date:   03/05/2025   Lactate dehydrogenase (LDH)    Standing Status:   Future    Expected Date:   03/05/2024    Expiration Date:   03/05/2025   Magnesium     Standing Status:   Future    Expected Date:   03/12/2024    Expiration Date:   03/12/2025   Lactate dehydrogenase (LDH)    Standing Status:   Future    Expected Date:   03/12/2024    Expiration Date:   03/12/2025   CBC with Differential (Cancer Center Only)    Standing Status:   Future    Expected Date:   03/12/2024    Expiration Date:   03/12/2025   CMP (Cancer Center only)    Standing Status:   Future    Expected Date:   03/12/2024    Expiration Date:   03/12/2025   Magnesium     Standing Status:   Future    Expected Date:   03/19/2024    Expiration Date:   03/19/2025   Lactate dehydrogenase (LDH)    Standing Status:   Future    Expected Date:   03/19/2024    Expiration Date:   03/19/2025   CBC with Differential (Cancer Center Only)    Standing Status:   Future    Expected Date:   03/19/2024    Expiration Date:   03/19/2025   CMP (Cancer Center only)    Standing Status:   Future    Expected Date:   03/19/2024    Expiration Date:   03/19/2025    All questions were answered. The patient knows to call the clinic with any problems,  questions or concerns.  A total of more than 30 minutes were spent on this encounter with face-to-face time and non-face-to-face time, including preparing to see the patient, ordering tests and/or medications, counseling the patient and coordination of care as outlined above.   Norleen IVAR Kidney, MD Department of Hematology/Oncology [facility] Phone: [phone] Pager: [phone] Email: [email or web address]  02/10/2024 4:54 PM

## 2024-02-06 NOTE — Patient Instructions (Signed)
 [facility] A [facility]PT OF [facility]  Discharge Instructions: Thank you for choosing [facility] to provide your oncology and hematology care.   If you have a lab appointment with the Cancer Center, please go directly to the Cancer Center and check in at the registration area.   Wear comfortable clothing and clothing appropriate for easy access to any Portacath or PICC line.   We strive to give you quality time with your provider. You may need to reschedule your appointment if you arrive late (15 or more minutes).  Arriving late affects you and other patients whose appointments are after yours.  Also, if you miss three or more appointments without notifying the office, you may be dismissed from the clinic at the provider's discretion.      For prescription refill requests, have your pharmacy contact our office and allow 72 hours for refills to be completed.    Today you received the following chemotherapy and/or immunotherapy agents: Elrexfio .      To help prevent nausea and vomiting after your treatment, we encourage you to take your nausea medication as directed.  BELOW ARE SYMPTOMS THAT SHOULD BE REPORTED IMMEDIATELY: *FEVER GREATER THAN 100.4 F (38 C) OR HIGHER *CHILLS OR SWEATING *NAUSEA AND VOMITING THAT IS NOT CONTROLLED WITH YOUR NAUSEA MEDICATION *UNUSUAL SHORTNESS OF BREATH *UNUSUAL BRUISING OR BLEEDING *URINARY PROBLEMS (pain or burning when urinating, or frequent urination) *BOWEL PROBLEMS (unusual diarrhea, constipation, pain near the anus) TENDERNESS IN MOUTH AND THROAT WITH OR WITHOUT PRESENCE OF ULCERS (sore throat, sores in mouth, or a toothache) UNUSUAL RASH, SWELLING OR PAIN  UNUSUAL VAGINAL DISCHARGE OR ITCHING   Items with * indicate a potential emergency and should be followed up as soon as possible or go to the Emergency Department if any problems should occur.  Please show the CHEMOTHERAPY ALERT CARD or IMMUNOTHERAPY  ALERT CARD at check-in to the Emergency Department and triage nurse.  Should you have questions after your visit or need to cancel or reschedule your appointment, please contact [facility]  Dept: [phone]  and follow the prompts.  Office hours are 8:00 a.m. to 4:30 p.m. Monday - Friday. Please note that voicemails left after 4:00 p.m. may not be returned until the following business day.  We are closed weekends and major holidays. You have access to a nurse at all times for urgent questions. Please call the main number to the clinic Dept: [phone] and follow the prompts.   For any non-urgent questions, you may also contact your provider using MyChart. We now offer e-Visits for anyone 61 and older to request care online for non-urgent symptoms. For details visit [email or web address].   Also download the MyChart app! Go to the app store, search MyChart, open the app, select [place], and log in with your MyChart username and password.

## 2024-02-07 ENCOUNTER — Other Ambulatory Visit: Payer: Self-pay

## 2024-02-10 ENCOUNTER — Encounter: Payer: Self-pay | Admitting: Hematology and Oncology

## 2024-02-13 ENCOUNTER — Inpatient Hospital Stay

## 2024-02-13 ENCOUNTER — Other Ambulatory Visit: Payer: Self-pay

## 2024-02-13 ENCOUNTER — Encounter: Payer: Self-pay | Admitting: Hematology and Oncology

## 2024-02-13 VITALS — BP 137/66 | HR 83 | Temp 98.6°F | Resp 18 | Wt 161.5 lb

## 2024-02-13 DIAGNOSIS — C9002 Multiple myeloma in relapse: Secondary | ICD-10-CM

## 2024-02-13 DIAGNOSIS — C9001 Multiple myeloma in remission: Secondary | ICD-10-CM | POA: Diagnosis not present

## 2024-02-13 LAB — CBC WITH DIFFERENTIAL (CANCER CENTER ONLY)
Abs Immature Granulocytes: 0.02 K/uL (ref 0.00–0.07)
Basophils Absolute: 0 K/uL (ref 0.0–0.1)
Basophils Relative: 1 %
Eosinophils Absolute: 0 K/uL (ref 0.0–0.5)
Eosinophils Relative: 0 %
HCT: 34.4 % — ABNORMAL LOW (ref 36.0–46.0)
Hemoglobin: 11.7 g/dL — ABNORMAL LOW (ref 12.0–15.0)
Immature Granulocytes: 1 %
Lymphocytes Relative: 33 %
Lymphs Abs: 0.7 K/uL (ref 0.7–4.0)
MCH: 33.7 pg (ref 26.0–34.0)
MCHC: 34 g/dL (ref 30.0–36.0)
MCV: 99.1 fL (ref 80.0–100.0)
Monocytes Absolute: 0.3 K/uL (ref 0.1–1.0)
Monocytes Relative: 15 %
Neutro Abs: 1 K/uL — ABNORMAL LOW (ref 1.7–7.7)
Neutrophils Relative %: 50 %
Platelet Count: 112 K/uL — ABNORMAL LOW (ref 150–400)
RBC: 3.47 MIL/uL — ABNORMAL LOW (ref 3.87–5.11)
RDW: 12.8 % (ref 11.5–15.5)
WBC Count: 2.1 K/uL — ABNORMAL LOW (ref 4.0–10.5)
nRBC: 0 % (ref 0.0–0.2)

## 2024-02-13 LAB — CMP (CANCER CENTER ONLY)
ALT: 11 U/L (ref 0–44)
AST: 17 U/L (ref 15–41)
Albumin: 4.2 g/dL (ref 3.5–5.0)
Alkaline Phosphatase: 83 U/L (ref 38–126)
Anion gap: 7 (ref 5–15)
BUN: 15 mg/dL (ref 8–23)
CO2: 28 mmol/L (ref 22–32)
Calcium: 8.9 mg/dL (ref 8.9–10.3)
Chloride: 106 mmol/L (ref 98–111)
Creatinine: 0.64 mg/dL (ref 0.44–1.00)
GFR, Estimated: >60 mL/min (ref >60)
Glucose, Bld: 99 mg/dL (ref 70–99)
Potassium: 3.6 mmol/L (ref 3.5–5.1)
Sodium: 141 mmol/L (ref 135–145)
Total Bilirubin: 0.4 mg/dL (ref 0.0–1.2)
Total Protein: 6.3 g/dL — ABNORMAL LOW (ref 6.5–8.1)

## 2024-02-13 LAB — LACTATE DEHYDROGENASE: LDH: 216 U/L — ABNORMAL HIGH (ref 98–192)

## 2024-02-13 LAB — MAGNESIUM: Magnesium: 1.8 mg/dL (ref 1.7–2.4)

## 2024-02-13 MED ORDER — ELRANATAMAB-BCMM 76 MG/1.9ML ~~LOC~~ SOLN
76.0000 mg | Freq: Once | SUBCUTANEOUS | Status: AC
  Administered 2024-02-13: 76 mg via SUBCUTANEOUS
  Filled 2024-02-13: qty 1.9

## 2024-02-13 NOTE — Patient Instructions (Signed)
 [facility] A [facility]PT OF [facility]  Discharge Instructions: Thank you for choosing [facility] to provide your oncology and hematology care.   If you have a lab appointment with the Cancer Center, please go directly to the Cancer Center and check in at the registration area.   Wear comfortable clothing and clothing appropriate for easy access to any Portacath or PICC line.   We strive to give you quality time with your provider. You may need to reschedule your appointment if you arrive late (15 or more minutes).  Arriving late affects you and other patients whose appointments are after yours.  Also, if you miss three or more appointments without notifying the office, you may be dismissed from the clinic at the provider's discretion.      For prescription refill requests, have your pharmacy contact our office and allow 72 hours for refills to be completed.    Today you received the following chemotherapy and/or immunotherapy agents: Elrexfio .      To help prevent nausea and vomiting after your treatment, we encourage you to take your nausea medication as directed.  BELOW ARE SYMPTOMS THAT SHOULD BE REPORTED IMMEDIATELY: *FEVER GREATER THAN 100.4 F (38 C) OR HIGHER *CHILLS OR SWEATING *NAUSEA AND VOMITING THAT IS NOT CONTROLLED WITH YOUR NAUSEA MEDICATION *UNUSUAL SHORTNESS OF BREATH *UNUSUAL BRUISING OR BLEEDING *URINARY PROBLEMS (pain or burning when urinating, or frequent urination) *BOWEL PROBLEMS (unusual diarrhea, constipation, pain near the anus) TENDERNESS IN MOUTH AND THROAT WITH OR WITHOUT PRESENCE OF ULCERS (sore throat, sores in mouth, or a toothache) UNUSUAL RASH, SWELLING OR PAIN  UNUSUAL VAGINAL DISCHARGE OR ITCHING   Items with * indicate a potential emergency and should be followed up as soon as possible or go to the Emergency Department if any problems should occur.  Please show the CHEMOTHERAPY ALERT CARD or IMMUNOTHERAPY  ALERT CARD at check-in to the Emergency Department and triage nurse.  Should you have questions after your visit or need to cancel or reschedule your appointment, please contact [facility]  Dept: [phone]  and follow the prompts.  Office hours are 8:00 a.m. to 4:30 p.m. Monday - Friday. Please note that voicemails left after 4:00 p.m. may not be returned until the following business day.  We are closed weekends and major holidays. You have access to a nurse at all times for urgent questions. Please call the main number to the clinic Dept: [phone] and follow the prompts.   For any non-urgent questions, you may also contact your provider using MyChart. We now offer e-Visits for anyone 61 and older to request care online for non-urgent symptoms. For details visit [email or web address].   Also download the MyChart app! Go to the app store, search MyChart, open the app, select [place], and log in with your MyChart username and password.

## 2024-02-15 ENCOUNTER — Other Ambulatory Visit: Payer: Self-pay | Admitting: Hematology and Oncology

## 2024-02-15 ENCOUNTER — Encounter: Payer: Self-pay | Admitting: Hematology and Oncology

## 2024-02-15 MED ORDER — AMOXICILLIN-POT CLAVULANATE 875-125 MG PO TABS: 1.0000 | ORAL_TABLET | Freq: Two times a day (BID) | ORAL | 0 refills | Status: AC

## 2024-02-19 ENCOUNTER — Encounter: Payer: Self-pay | Admitting: Hematology and Oncology

## 2024-02-21 ENCOUNTER — Ambulatory Visit

## 2024-02-21 ENCOUNTER — Other Ambulatory Visit

## 2024-02-21 ENCOUNTER — Other Ambulatory Visit: Payer: Self-pay | Admitting: Hematology and Oncology

## 2024-02-21 ENCOUNTER — Inpatient Hospital Stay

## 2024-02-21 VITALS — BP 146/92 | HR 68 | Temp 98.1°F | Resp 17 | Ht 62.0 in | Wt 161.8 lb

## 2024-02-21 DIAGNOSIS — C9001 Multiple myeloma in remission: Secondary | ICD-10-CM | POA: Diagnosis not present

## 2024-02-21 DIAGNOSIS — C9002 Multiple myeloma in relapse: Secondary | ICD-10-CM

## 2024-02-21 LAB — CBC WITH DIFFERENTIAL (CANCER CENTER ONLY)
Abs Immature Granulocytes: 0.02 K/uL (ref 0.00–0.07)
Basophils Absolute: 0 K/uL (ref 0.0–0.1)
Basophils Relative: 0 %
Eosinophils Absolute: 0 K/uL (ref 0.0–0.5)
Eosinophils Relative: 0 %
HCT: 34.9 % — ABNORMAL LOW (ref 36.0–46.0)
Hemoglobin: 11.6 g/dL — ABNORMAL LOW (ref 12.0–15.0)
Immature Granulocytes: 1 %
Lymphocytes Relative: 37 %
Lymphs Abs: 1.1 K/uL (ref 0.7–4.0)
MCH: 33.5 pg (ref 26.0–34.0)
MCHC: 33.2 g/dL (ref 30.0–36.0)
MCV: 100.9 fL — ABNORMAL HIGH (ref 80.0–100.0)
Monocytes Absolute: 0.5 K/uL (ref 0.1–1.0)
Monocytes Relative: 16 %
Neutro Abs: 1.4 K/uL — ABNORMAL LOW (ref 1.7–7.7)
Neutrophils Relative %: 46 %
Platelet Count: 114 K/uL — ABNORMAL LOW (ref 150–400)
RBC: 3.46 MIL/uL — ABNORMAL LOW (ref 3.87–5.11)
RDW: 13.6 % (ref 11.5–15.5)
WBC Count: 3 K/uL — ABNORMAL LOW (ref 4.0–10.5)
nRBC: 0 % (ref 0.0–0.2)

## 2024-02-21 LAB — CMP (CANCER CENTER ONLY)
ALT: 11 U/L (ref 0–44)
AST: 15 U/L (ref 15–41)
Albumin: 4 g/dL (ref 3.5–5.0)
Alkaline Phosphatase: 99 U/L (ref 38–126)
Anion gap: 5 (ref 5–15)
BUN: 14 mg/dL (ref 8–23)
CO2: 30 mmol/L (ref 22–32)
Calcium: 8.8 mg/dL — ABNORMAL LOW (ref 8.9–10.3)
Chloride: 107 mmol/L (ref 98–111)
Creatinine: 0.7 mg/dL (ref 0.44–1.00)
GFR, Estimated: >60 mL/min (ref >60)
Glucose, Bld: 89 mg/dL (ref 70–99)
Potassium: 3.5 mmol/L (ref 3.5–5.1)
Sodium: 142 mmol/L (ref 135–145)
Total Bilirubin: 0.3 mg/dL (ref 0.0–1.2)
Total Protein: 6 g/dL — ABNORMAL LOW (ref 6.5–8.1)

## 2024-02-21 MED ORDER — ELRANATAMAB-BCMM 76 MG/1.9ML ~~LOC~~ SOLN
76.0000 mg | Freq: Once | SUBCUTANEOUS | Status: AC
  Administered 2024-02-21: 76 mg via SUBCUTANEOUS
  Filled 2024-02-21: qty 1.9

## 2024-02-21 NOTE — Patient Instructions (Signed)
 [facility] A [facility]PT OF [facility]  Discharge Instructions: Thank you for choosing [facility] to provide your oncology and hematology care.   If you have a lab appointment with the Cancer Center, please go directly to the Cancer Center and check in at the registration area.   Wear comfortable clothing and clothing appropriate for easy access to any Portacath or PICC line.   We strive to give you quality time with your provider. You may need to reschedule your appointment if you arrive late (15 or more minutes).  Arriving late affects you and other patients whose appointments are after yours.  Also, if you miss three or more appointments without notifying the office, you may be dismissed from the clinic at the provider's discretion.      For prescription refill requests, have your pharmacy contact our office and allow 72 hours for refills to be completed.    Today you received the following chemotherapy and/or immunotherapy agents: Elrexfio .      To help prevent nausea and vomiting after your treatment, we encourage you to take your nausea medication as directed.  BELOW ARE SYMPTOMS THAT SHOULD BE REPORTED IMMEDIATELY: *FEVER GREATER THAN 100.4 F (38 C) OR HIGHER *CHILLS OR SWEATING *NAUSEA AND VOMITING THAT IS NOT CONTROLLED WITH YOUR NAUSEA MEDICATION *UNUSUAL SHORTNESS OF BREATH *UNUSUAL BRUISING OR BLEEDING *URINARY PROBLEMS (pain or burning when urinating, or frequent urination) *BOWEL PROBLEMS (unusual diarrhea, constipation, pain near the anus) TENDERNESS IN MOUTH AND THROAT WITH OR WITHOUT PRESENCE OF ULCERS (sore throat, sores in mouth, or a toothache) UNUSUAL RASH, SWELLING OR PAIN  UNUSUAL VAGINAL DISCHARGE OR ITCHING   Items with * indicate a potential emergency and should be followed up as soon as possible or go to the Emergency Department if any problems should occur.  Please show the CHEMOTHERAPY ALERT CARD or IMMUNOTHERAPY  ALERT CARD at check-in to the Emergency Department and triage nurse.  Should you have questions after your visit or need to cancel or reschedule your appointment, please contact [facility]  Dept: [phone]  and follow the prompts.  Office hours are 8:00 a.m. to 4:30 p.m. Monday - Friday. Please note that voicemails left after 4:00 p.m. may not be returned until the following business day.  We are closed weekends and major holidays. You have access to a nurse at all times for urgent questions. Please call the main number to the clinic Dept: [phone] and follow the prompts.   For any non-urgent questions, you may also contact your provider using MyChart. We now offer e-Visits for anyone 61 and older to request care online for non-urgent symptoms. For details visit [email or web address].   Also download the MyChart app! Go to the app store, search MyChart, open the app, select [place], and log in with your MyChart username and password.

## 2024-02-26 NOTE — Progress Notes (Unsigned)
 [facility] [phone]   [phone]  PROGRESS NOTE  Patient Care Team: Patient, [name] as PCP - General (General Practice) Darron Deatrice LABOR, MD as PCP - Cardiology (Cardiology) Darren Liborio Backers, MD as Referring Physician (Hematology and Oncology) Guinevere File, MD as Consulting Physician (Internal Medicine) Fleeta Rothman, Jomarie SAILOR, MD as Consulting Physician (Infectious Diseases) Marda Maranda Foots An, MD as Consulting Physician (Hematology and Oncology) Honora Lenon Irving, NP as Nurse Practitioner (Hematology and Oncology) Lonn Hicks, MD as Consulting Physician (Hematology and Oncology)  Hematological/Oncological History # Relapsed/Refractory IgA Lambda Multiple Myeloma, In Remission 2016: Underwent autologous stem cell transplant 2017: Underwent allogenic stem cell transplant 10/16/2022: Completed CAR-T therapy with [facility]ec[facility] 09/2023: Started on Elrexfio  due to progression of disease 01/02/2024: Last visit at [facility] where induction therapy was completed 01/09/2024: establish care with Dr. Federico.   Interval History:  Jean Davidson 78 y.o. female with medical history significant for relapsed/refractory multiple myeloma who presents for a follow up visit. The patient's last visit was on 02/06/2024 and since then she has continued bispecific therapy. In the interim since the last visit she has continued maintenance Elrexfio  at [facility].    Ms. Lyles reports she is ready for cycle 6 today.  She reports that she has been feeling her usual which includes fatigue.  She reports that she had recent visits with her dermatologist and retinal specialist in [place].  She reports that she anticipates she will require IVIG as her last IgG levels were low.  She reports that she does continue to have neuropathy and shuffles her feet.  She notes that she has been disheartened by the fact that she is not losing weight and that she is  steady at 160 pounds.  She reports that she does occasionally have bouts of vomiting and diarrhea but thinks this is secondary to her hiatal hernia.  She reports that she does get winded with physical activity easily.  Overall that she has been eating well and is tolerating her treatment without any major side effects.  Overall she is willing and able to continue on Elrexfio  therapy at this time.  A full 10 point ROS is otherwise negative.   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   Multiple myeloma (HCC)    Osteopenia    Pinched nerve    Sinusitis, bacterial 07/27/2015   Spinal stenosis in cervical region    Varicose veins of lower extremities with inflammation     SURGICAL HISTORY: Past Surgical History:  Procedure Laterality Date   ANKLE SURGERY Left    BLADDER SUSPENSION  1988/1989   x2   COLONOSCOPY     ESOPHAGOGASTRODUODENOSCOPY  IR IMAGING GUIDED PORT INSERTION  03/01/2018   LIMBAL STEM CELL TRANSPLANT     x2   PORT-A-CATH REMOVAL     SHOULDER SURGERY Right 04/06/2013   right   UPPER GASTROINTESTINAL ENDOSCOPY      SOCIAL HISTORY: Social History   Socioeconomic History   Marital status: Divorced    Spouse name: Not on file   Number of children: Not on file   Years of education: Not on file   Highest education level: Not on file   Occupational History   Not on file  Tobacco Use   Smoking status: Former    Current packs/day: 0.00    Average packs/day: 0.5 packs/day for 4.0 years (2.0 ttl pk-yrs)    Types: Cigarettes    Start date: 06/26/1964    Quit date: 06/26/1968    Years since quitting: 55.7   Smokeless tobacco: Never  Vaping Use   Vaping status: Never Used  Substance and Sexual Activity   Alcohol  use: Yes    Alcohol /week: 1.0 standard drink of alcohol     Types: 1 Cans of beer per week    Comment: 1 per week per pt   Drug use: No   Sexual activity: Not on file  Other Topics Concern   Not on file  Social History Narrative   Jean Davidson is daughter margarette, vannatter will,  full code ( reviewed 82)   Social Drivers of Health   Financial Resource Strain: Low Risk  (10/09/2023)   Received from [facility]   Overall Financial Resource Strain (CARDIA)    Difficulty of Paying Living Expenses: Not hard at all  Food Insecurity: No Food Insecurity (10/09/2023)   Received from [facility]   Hunger Vital Sign    Within the past 12 months, you worried that your food would run out before you got the money to buy more.: Never true    Within the past 12 months, the food you bought just didn't last and you didn't have money to get more.: Never true  Transportation Needs: Unknown (10/09/2023)   Received from [facility] - Transportation    In the past 12 months, has lack of transportation kept you from medical appointments or from getting medications?: No    Lack of Transportation (Non-Medical): Not on file  Physical Activity: Not on file  Stress: Not on file  Social Connections: Not on file  Intimate Partner Violence: Not on file    FAMILY HISTORY: Family History  Problem Relation Age of Onset   Heart disease Mother    Heart failure Father    Heart disease Father    Throat cancer Father    Skin cancer Father    Diabetes Father    Kidney  disease Father    Hypertension Brother    Heart disease Brother        congenital shunt, ?    Colon cancer Paternal Aunt 64   Skin cancer Brother    Stomach cancer Neg Hx    Esophageal cancer Neg Hx    Rectal cancer Neg Hx     ALLERGIES:  has no known allergies.  MEDICATIONS:  Current Outpatient Medications  Medication Sig Dispense Refill   acyclovir  (ZOVIRAX ) 400 MG tablet Take 1 tablet (400 mg total) by mouth 2 (two) times daily. 180 tablet 1   aspirin EC 81 MG tablet Take 81 mg by mouth daily. Swallow whole.     atovaquone  (MEPRON ) 750  MG/5ML suspension Take 1,500 mg by mouth 2 (two) times daily.     cholecalciferol  (VITAMIN D3) 25 MCG (1000 UNIT) tablet Take 4,000 Units by mouth daily. Instructed to increase to 4,000 units daily     metoprolol  tartrate (LOPRESSOR ) 25 MG tablet TAKE 1 & 1/2 TABLET BY MOUTH TWICE A DAY--due for follow up appointment. 270 tablet 3   mirtazapine  (REMERON ) 15 MG tablet TAKE 1 TABLET BY MOUTH EVERYDAY AT BEDTIME 90 tablet 1   Multiple Vitamins-Minerals (PRESERVISION AREDS 2 PO) Take 1 tablet by mouth 2 (two) times daily.     ondansetron  (ZOFRAN ) 8 MG tablet Take 1 tablet (8 mg total) by mouth every 8 (eight) hours as needed (Nausea or vomiting). (Patient taking differently: Take 8 mg by mouth 2 (two) times daily.) 60 tablet 1   pantoprazole  (PROTONIX ) 40 MG tablet TAKE 1 TABLET BY MOUTH 2 TIMES DAILY. TAKE 30-60 MINUTES BEFORE BREAKFAST AND DINNER. 180 tablet 0   prochlorperazine  (COMPAZINE ) 5 MG tablet Take 5 mg by mouth every 6 (six) hours as needed for nausea or vomiting.     senna-docusate (SENOKOT-S) 8.6-50 MG tablet Take 2 tablets by mouth daily as needed.     No current facility-administered medications for this visit.    REVIEW OF SYSTEMS:   Constitutional: ( - ) fevers, ( - )  chills , ( - ) night sweats Eyes: ( - ) blurriness of vision, ( - ) double vision, ( - ) watery eyes Ears, nose, mouth, throat, and face: ( - ) mucositis, ( - ) sore  throat Respiratory: ( - ) cough, ( - ) dyspnea, ( - ) wheezes Cardiovascular: ( - ) palpitation, ( - ) chest discomfort, ( - ) lower extremity swelling Gastrointestinal:  ( - ) nausea, ( - ) heartburn, ( - ) change in bowel habits Skin: ( - ) abnormal skin rashes Lymphatics: ( - ) new lymphadenopathy, ( - ) easy bruising Neurological: ( - ) numbness, ( - ) tingling, ( - ) new weaknesses Behavioral/Psych: ( - ) mood change, ( - ) new changes  All other systems were reviewed with the patient and are negative.  PHYSICAL EXAMINATION: ECOG PERFORMANCE STATUS: 1 - Symptomatic but completely ambulatory  Vitals:   02/27/24 0918  BP: 124/73  Pulse: 72  Resp: 14  Temp: 98 F (36.7 C)  SpO2: 99%   Filed Weights   02/27/24 0918  Weight: 160 lb 8 oz (72.8 kg)    GENERAL: Well-appearing elderly Caucasian female, alert, no distress and comfortable SKIN: skin color, texture, turgor are normal, no rashes or significant lesions EYES: conjunctiva are pink and non-injected, sclera clear LUNGS: clear to auscultation and percussion with normal breathing effort HEART: regular rate & rhythm and no murmurs and no lower extremity edema Musculoskeletal: no cyanosis of digits and no clubbing  PSYCH: alert & oriented x 3, fluent speech NEURO: no focal motor/sensory deficits  LABORATORY DATA:  I have reviewed the data as listed    Latest Ref Rng & Units 02/27/2024    8:41 AM 02/21/2024   12:04 PM 02/13/2024    7:53 AM  CBC  WBC 4.0 - 10.5 K/uL 2.8  3.0  2.1   Hemoglobin 12.0 - 15.0 g/dL 88.1  88.3  88.2   Hematocrit 36.0 - 46.0 % 35.9  34.9  34.4   Platelets 150 - 400 K/uL 121  114  112        Latest Ref Rng & Units 02/27/2024  8:41 AM 02/21/2024   12:04 PM 02/13/2024    7:53 AM  CMP  Glucose 70 - 99 mg/dL 897  89  99   BUN 8 - 23 mg/dL 18  14  15    Creatinine 0.44 - 1.00 mg/dL 9.29  9.29  9.35   Sodium 135 - 145 mmol/L 141  142  141   Potassium 3.5 - 5.1 mmol/L 3.9  3.5  3.6   Chloride 98 -  111 mmol/L 106  107  106   CO2 22 - 32 mmol/L 29  30  28    Calcium  8.9 - 10.3 mg/dL 8.7  8.8  8.9   Total Protein 6.5 - 8.1 g/dL 6.4  6.0  6.3   Total Bilirubin 0.0 - 1.2 mg/dL 0.4  0.3  0.4   Alkaline Phos 38 - 126 U/L 97  99  83   AST 15 - 41 U/L 17  15  17    ALT 0 - 44 U/L 13  11  11      Lab Results  Component Value Date   MPROTEIN Not Observed 01/30/2024   MPROTEIN 0.5 (H) 09/25/2023   MPROTEIN 0.5 (H) 08/31/2023   Lab Results  Component Value Date   KPAFRELGTCHN 1.3 (L) 01/30/2024   KPAFRELGTCHN 10.1 09/25/2023   KPAFRELGTCHN 12.1 08/31/2023   LAMBDASER <1.5 (L) 01/30/2024   LAMBDASER 133.2 (H) 09/25/2023   LAMBDASER 98.2 (H) 08/31/2023   KAPLAMBRATIO >0.87 01/30/2024   KAPLAMBRATIO 0.08 (L) 09/25/2023   KAPLAMBRATIO 0.12 (L) 08/31/2023     RADIOGRAPHIC STUDIES: No results found.  ASSESSMENT & PLAN JAONNA WORD 78 y.o. female with medical history significant for relapsed/refractory multiple myeloma who presents for a follow up visit.   # Relapsed/Refractory IgA Lambda Multiple Myeloma, In Remission -- Patient is undergone numerous prior therapies including autosomal stem cell transplant, allogenic stem cell transplant, CAR-T therapy, and now bispecific therapy --Patient completed her induction of bispecific therapy at [facility] -- Patient presents today to begin maintenance therapy. --Labs today show white blood cell 2.8, Hgb 11.8, MCV 101.1, Plt 121.  LFTs and creatinine adequate for continued treatment -- Will plan for weekly maintenance Elrexfio  with clinic visits every other treatment.  Will transition to every 2 weekly dosing with cycle 7.  #Supportive Care -- zofran  8mg  q8H PRN and compazine  10mg  PO q6H for nausea -- acyclovir  400mg  PO BID for VCZ prophylaxis --Continue atovaquone  1500 mg twice daily -- IVIG to be administered when IgG are less than 400 -- EMLA  cream for port -- no pain medication required at this time.   Orders Placed  This Encounter  Procedures   Magnesium     Standing Status:   Future    Expected Date:   03/26/2024    Expiration Date:   03/26/2025   Multiple Myeloma Panel (SPEP&IFE w/QIG)    Standing Status:   Future    Expected Date:   03/26/2024    Expiration Date:   03/26/2025   Kappa/lambda light chains    Standing Status:   Future    Expected Date:   03/26/2024    Expiration Date:   03/26/2025   CBC with Differential (Cancer Center Only)    Standing Status:   Future    Expected Date:   03/26/2024    Expiration Date:   03/26/2025   CMP (Cancer Center only)    Standing Status:   Future    Expected Date:   03/26/2024    Expiration Date:  03/26/2025   CBC with Differential (Cancer Center Only)    Standing Status:   Future    Expected Date:   04/09/2024    Expiration Date:   04/09/2025   CMP (Cancer Center only)    Standing Status:   Future    Expected Date:   04/09/2024    Expiration Date:   04/09/2025   Magnesium     Standing Status:   Future    Expected Date:   04/23/2024    Expiration Date:   04/23/2025   Multiple Myeloma Panel (SPEP&IFE w/QIG)    Standing Status:   Future    Expected Date:   04/23/2024    Expiration Date:   04/23/2025   Kappa/lambda light chains    Standing Status:   Future    Expected Date:   04/23/2024    Expiration Date:   04/23/2025   CBC with Differential (Cancer Center Only)    Standing Status:   Future    Expected Date:   04/23/2024    Expiration Date:   04/23/2025   CMP (Cancer Center only)    Standing Status:   Future    Expected Date:   04/23/2024    Expiration Date:   04/23/2025   CBC with Differential (Cancer Center Only)    Standing Status:   Future    Expected Date:   05/07/2024    Expiration Date:   05/07/2025   CMP (Cancer Center only)    Standing Status:   Future    Expected Date:   05/07/2024    Expiration Date:   05/07/2025   Magnesium     Standing Status:   Future    Expected Date:   05/21/2024    Expiration Date:   05/21/2025   Multiple  Myeloma Panel (SPEP&IFE w/QIG)    Standing Status:   Future    Expected Date:   05/21/2024    Expiration Date:   05/21/2025   Kappa/lambda light chains    Standing Status:   Future    Expected Date:   05/21/2024    Expiration Date:   05/21/2025   CBC with Differential (Cancer Center Only)    Standing Status:   Future    Expected Date:   05/21/2024    Expiration Date:   05/21/2025   CMP (Cancer Center only)    Standing Status:   Future    Expected Date:   05/21/2024    Expiration Date:   05/21/2025   CBC with Differential (Cancer Center Only)    Standing Status:   Future    Expected Date:   06/04/2024    Expiration Date:   06/04/2025   CMP (Cancer Center only)    Standing Status:   Future    Expected Date:   06/04/2024    Expiration Date:   06/04/2025    All questions were answered. The patient knows to call the clinic with any problems, questions or concerns.  A total of more than 30 minutes were spent on this encounter with face-to-face time and non-face-to-face time, including preparing to see the patient, ordering tests and/or medications, counseling the patient and coordination of care as outlined above.   Norleen IVAR Kidney, MD Department of Hematology/Oncology [facility] Phone: [phone] Pager: [phone] Email: [email or web address]  02/28/2024 11:24 AM

## 2024-02-27 ENCOUNTER — Inpatient Hospital Stay

## 2024-02-27 ENCOUNTER — Inpatient Hospital Stay: Attending: Hematology and Oncology

## 2024-02-27 ENCOUNTER — Other Ambulatory Visit: Payer: Self-pay

## 2024-02-27 ENCOUNTER — Inpatient Hospital Stay (HOSPITAL_BASED_OUTPATIENT_CLINIC_OR_DEPARTMENT_OTHER): Admitting: Hematology and Oncology

## 2024-02-27 VITALS — BP 124/73 | HR 72 | Temp 98.0°F | Resp 14 | Wt 160.5 lb

## 2024-02-27 DIAGNOSIS — D649 Anemia, unspecified: Secondary | ICD-10-CM | POA: Diagnosis not present

## 2024-02-27 DIAGNOSIS — R509 Fever, unspecified: Secondary | ICD-10-CM | POA: Insufficient documentation

## 2024-02-27 DIAGNOSIS — Z9484 Stem cells transplant status: Secondary | ICD-10-CM | POA: Diagnosis not present

## 2024-02-27 DIAGNOSIS — Z87891 Personal history of nicotine dependence: Secondary | ICD-10-CM | POA: Insufficient documentation

## 2024-02-27 DIAGNOSIS — D539 Nutritional anemia, unspecified: Secondary | ICD-10-CM

## 2024-02-27 DIAGNOSIS — Z800 Family history of malignant neoplasm of digestive organs: Secondary | ICD-10-CM | POA: Insufficient documentation

## 2024-02-27 DIAGNOSIS — C9002 Multiple myeloma in relapse: Secondary | ICD-10-CM | POA: Insufficient documentation

## 2024-02-27 DIAGNOSIS — D72819 Decreased white blood cell count, unspecified: Secondary | ICD-10-CM | POA: Diagnosis not present

## 2024-02-27 DIAGNOSIS — Z808 Family history of malignant neoplasm of other organs or systems: Secondary | ICD-10-CM | POA: Insufficient documentation

## 2024-02-27 LAB — CBC WITH DIFFERENTIAL (CANCER CENTER ONLY)
Abs Immature Granulocytes: 0.02 K/uL (ref 0.00–0.07)
Basophils Absolute: 0 K/uL (ref 0.0–0.1)
Basophils Relative: 0 %
Eosinophils Absolute: 0 K/uL (ref 0.0–0.5)
Eosinophils Relative: 0 %
HCT: 35.9 % — ABNORMAL LOW (ref 36.0–46.0)
Hemoglobin: 11.8 g/dL — ABNORMAL LOW (ref 12.0–15.0)
Immature Granulocytes: 1 %
Lymphocytes Relative: 26 %
Lymphs Abs: 0.7 K/uL (ref 0.7–4.0)
MCH: 33.2 pg (ref 26.0–34.0)
MCHC: 32.9 g/dL (ref 30.0–36.0)
MCV: 101.1 fL — ABNORMAL HIGH (ref 80.0–100.0)
Monocytes Absolute: 0.5 K/uL (ref 0.1–1.0)
Monocytes Relative: 18 %
Neutro Abs: 1.6 K/uL — ABNORMAL LOW (ref 1.7–7.7)
Neutrophils Relative %: 55 %
Platelet Count: 121 K/uL — ABNORMAL LOW (ref 150–400)
RBC: 3.55 MIL/uL — ABNORMAL LOW (ref 3.87–5.11)
RDW: 13.5 % (ref 11.5–15.5)
WBC Count: 2.8 K/uL — ABNORMAL LOW (ref 4.0–10.5)
nRBC: 0 % (ref 0.0–0.2)

## 2024-02-27 LAB — CMP (CANCER CENTER ONLY)
ALT: 13 U/L (ref 0–44)
AST: 17 U/L (ref 15–41)
Albumin: 4 g/dL (ref 3.5–5.0)
Alkaline Phosphatase: 97 U/L (ref 38–126)
Anion gap: 6 (ref 5–15)
BUN: 18 mg/dL (ref 8–23)
CO2: 29 mmol/L (ref 22–32)
Calcium: 8.7 mg/dL — ABNORMAL LOW (ref 8.9–10.3)
Chloride: 106 mmol/L (ref 98–111)
Creatinine: 0.7 mg/dL (ref 0.44–1.00)
GFR, Estimated: >60 mL/min (ref >60)
Glucose, Bld: 102 mg/dL — ABNORMAL HIGH (ref 70–99)
Potassium: 3.9 mmol/L (ref 3.5–5.1)
Sodium: 141 mmol/L (ref 135–145)
Total Bilirubin: 0.4 mg/dL (ref 0.0–1.2)
Total Protein: 6.4 g/dL — ABNORMAL LOW (ref 6.5–8.1)

## 2024-02-27 LAB — MAGNESIUM: Magnesium: 1.9 mg/dL (ref 1.7–2.4)

## 2024-02-27 MED ORDER — ELRANATAMAB-BCMM 76 MG/1.9ML ~~LOC~~ SOLN
76.0000 mg | Freq: Once | SUBCUTANEOUS | Status: AC
  Administered 2024-02-27: 76 mg via SUBCUTANEOUS
  Filled 2024-02-27: qty 1.9

## 2024-02-27 NOTE — Progress Notes (Unsigned)
 PGY2 Oncology Pharmacy Resident Intervention  Intervention Counseled patient on the importance of being compliant with their atovaquone  while on BiTE therapy, especially since risk of infections increases over time on therapy. Patient voiced understanding, they are not a keen on the flavoring of the medication. They previously were on Bactrim, but had to switch due to the development of neutropenia.  Called patient and to talk about a few strategies to increase palatability of the medication, including using a syringe to place the medication behind the taste buds or chase the medication with milk, milkshake or some other beverage.   Patient will need an updated prescription as the current one expires this month. (Send In Basket message to Dr. Federico and Landry Arabia, RN)  Thank you for allowing pharmacy to participate in this patient's care.  Alfonso MARLA Buys, PharmD Pharmacy Resident  02/27/2024 11:35 AM

## 2024-02-27 NOTE — Patient Instructions (Signed)
 [facility] A [facility]PT OF [facility]  Discharge Instructions: Thank you for choosing [facility] to provide your oncology and hematology care.   If you have a lab appointment with the Cancer Center, please go directly to the Cancer Center and check in at the registration area.   Wear comfortable clothing and clothing appropriate for easy access to any Portacath or PICC line.   We strive to give you quality time with your provider. You may need to reschedule your appointment if you arrive late (15 or more minutes).  Arriving late affects you and other patients whose appointments are after yours.  Also, if you miss three or more appointments without notifying the office, you may be dismissed from the clinic at the provider's discretion.      For prescription refill requests, have your pharmacy contact our office and allow 72 hours for refills to be completed.    Today you received the following chemotherapy and/or immunotherapy agents: Elrexfio .      To help prevent nausea and vomiting after your treatment, we encourage you to take your nausea medication as directed.  BELOW ARE SYMPTOMS THAT SHOULD BE REPORTED IMMEDIATELY: *FEVER GREATER THAN 100.4 F (38 C) OR HIGHER *CHILLS OR SWEATING *NAUSEA AND VOMITING THAT IS NOT CONTROLLED WITH YOUR NAUSEA MEDICATION *UNUSUAL SHORTNESS OF BREATH *UNUSUAL BRUISING OR BLEEDING *URINARY PROBLEMS (pain or burning when urinating, or frequent urination) *BOWEL PROBLEMS (unusual diarrhea, constipation, pain near the anus) TENDERNESS IN MOUTH AND THROAT WITH OR WITHOUT PRESENCE OF ULCERS (sore throat, sores in mouth, or a toothache) UNUSUAL RASH, SWELLING OR PAIN  UNUSUAL VAGINAL DISCHARGE OR ITCHING   Items with * indicate a potential emergency and should be followed up as soon as possible or go to the Emergency Department if any problems should occur.  Please show the CHEMOTHERAPY ALERT CARD or IMMUNOTHERAPY  ALERT CARD at check-in to the Emergency Department and triage nurse.  Should you have questions after your visit or need to cancel or reschedule your appointment, please contact [facility]  Dept: [phone]  and follow the prompts.  Office hours are 8:00 a.m. to 4:30 p.m. Monday - Friday. Please note that voicemails left after 4:00 p.m. may not be returned until the following business day.  We are closed weekends and major holidays. You have access to a nurse at all times for urgent questions. Please call the main number to the clinic Dept: [phone] and follow the prompts.   For any non-urgent questions, you may also contact your provider using MyChart. We now offer e-Visits for anyone 61 and older to request care online for non-urgent symptoms. For details visit [email or web address].   Also download the MyChart app! Go to the app store, search MyChart, open the app, select [place], and log in with your MyChart username and password.

## 2024-02-28 ENCOUNTER — Encounter: Payer: Self-pay | Admitting: Hematology and Oncology

## 2024-02-28 LAB — KAPPA/LAMBDA LIGHT CHAINS
Kappa free light chain: <0.7 mg/L — ABNORMAL LOW (ref 3.3–19.4)
Kappa, lambda light chain ratio: UNDETERMINED
Lambda free light chains: <1.5 mg/L — ABNORMAL LOW (ref 5.7–26.3)

## 2024-02-29 ENCOUNTER — Other Ambulatory Visit: Payer: Self-pay | Admitting: Hematology and Oncology

## 2024-02-29 ENCOUNTER — Other Ambulatory Visit: Payer: Self-pay

## 2024-02-29 MED ORDER — ATOVAQUONE 750 MG/5ML PO SUSP: 1500.0000 mg | Freq: Two times a day (BID) | ORAL | 3 refills | Status: DC

## 2024-03-02 ENCOUNTER — Other Ambulatory Visit: Payer: Self-pay

## 2024-03-03 LAB — MULTIPLE MYELOMA PANEL, SERUM
Albumin SerPl Elph-Mcnc: 3.3 g/dL (ref 2.9–4.4)
Albumin/Glob SerPl: 1.5 (ref 0.7–1.7)
Alpha 1: 0.3 g/dL (ref 0.0–0.4)
Alpha2 Glob SerPl Elph-Mcnc: 0.9 g/dL (ref 0.4–1.0)
B-Globulin SerPl Elph-Mcnc: 0.9 g/dL (ref 0.7–1.3)
Gamma Glob SerPl Elph-Mcnc: 0.2 g/dL — ABNORMAL LOW (ref 0.4–1.8)
Globulin, Total: 2.3 g/dL (ref 2.2–3.9)
IgA: <5 mg/dL — ABNORMAL LOW (ref 64–422)
IgG (Immunoglobin G), Serum: 301 mg/dL — ABNORMAL LOW (ref 586–1602)
IgM (Immunoglobulin M), Srm: <5 mg/dL — ABNORMAL LOW (ref 26–217)
Total Protein ELP: 5.6 g/dL — ABNORMAL LOW (ref 6.0–8.5)

## 2024-03-04 ENCOUNTER — Telehealth: Payer: Self-pay | Admitting: *Deleted

## 2024-03-04 ENCOUNTER — Other Ambulatory Visit: Payer: Self-pay | Admitting: Hematology and Oncology

## 2024-03-04 NOTE — Telephone Encounter (Signed)
 Medication Prior Authorization Status  Processed CoverMyMeds KEY: [id number] for Atovaquone  750MG /5ML suspension  Approved Today  Per [employer] Electronic PA   PA Case ID: #: [id number] Rx #: [id number]   Effective 06/27/2023 through 06/02/2024.

## 2024-03-05 ENCOUNTER — Inpatient Hospital Stay (HOSPITAL_BASED_OUTPATIENT_CLINIC_OR_DEPARTMENT_OTHER): Admitting: Physician Assistant

## 2024-03-05 ENCOUNTER — Inpatient Hospital Stay

## 2024-03-05 ENCOUNTER — Inpatient Hospital Stay: Admitting: Dietician

## 2024-03-05 ENCOUNTER — Other Ambulatory Visit: Payer: Self-pay | Admitting: *Deleted

## 2024-03-05 ENCOUNTER — Ambulatory Visit (HOSPITAL_COMMUNITY)
Admission: RE | Admit: 2024-03-05 | Discharge: 2024-03-05 | Disposition: A | Discharge status: Home / Self Care | Source: Ambulatory Visit | Attending: Physician Assistant | Admitting: Physician Assistant

## 2024-03-05 ENCOUNTER — Telehealth: Payer: Self-pay

## 2024-03-05 ENCOUNTER — Encounter: Payer: Self-pay | Admitting: Hematology and Oncology

## 2024-03-05 VITALS — BP 111/54 | HR 75 | Temp 99.1°F | Resp 18 | Wt 159.0 lb

## 2024-03-05 VITALS — BP 142/69 | HR 74 | Temp 100.6°F | Resp 20

## 2024-03-05 DIAGNOSIS — R000 Tachycardia, unspecified: Secondary | ICD-10-CM | POA: Diagnosis not present

## 2024-03-05 DIAGNOSIS — R509 Fever, unspecified: Secondary | ICD-10-CM | POA: Insufficient documentation

## 2024-03-05 DIAGNOSIS — C9002 Multiple myeloma in relapse: Secondary | ICD-10-CM | POA: Diagnosis not present

## 2024-03-05 DIAGNOSIS — A419 Sepsis, unspecified organism: Secondary | ICD-10-CM | POA: Diagnosis not present

## 2024-03-05 LAB — URINALYSIS, COMPLETE (UACMP) WITH MICROSCOPIC
Bilirubin Urine: NEGATIVE
Glucose, UA: NEGATIVE mg/dL
Hgb urine dipstick: NEGATIVE
Ketones, ur: NEGATIVE mg/dL
Nitrite: NEGATIVE
Protein, ur: 30 mg/dL — AB
Specific Gravity, Urine: 1.031 — ABNORMAL HIGH (ref 1.005–1.030)
WBC, UA: >50 WBC/hpf (ref 0–5)
pH: 5 (ref 5.0–8.0)

## 2024-03-05 LAB — CBC WITH DIFFERENTIAL (CANCER CENTER ONLY)
Abs Immature Granulocytes: 0.03 K/uL (ref 0.00–0.07)
Basophils Absolute: 0 K/uL (ref 0.0–0.1)
Basophils Relative: 0 %
Eosinophils Absolute: 0 K/uL (ref 0.0–0.5)
Eosinophils Relative: 0 %
HCT: 35.2 % — ABNORMAL LOW (ref 36.0–46.0)
Hemoglobin: 11.8 g/dL — ABNORMAL LOW (ref 12.0–15.0)
Immature Granulocytes: 1 %
Lymphocytes Relative: 27 %
Lymphs Abs: 1 K/uL (ref 0.7–4.0)
MCH: 33.1 pg (ref 26.0–34.0)
MCHC: 33.5 g/dL (ref 30.0–36.0)
MCV: 98.6 fL (ref 80.0–100.0)
Monocytes Absolute: 0.5 K/uL (ref 0.1–1.0)
Monocytes Relative: 14 %
Neutro Abs: 2.2 K/uL (ref 1.7–7.7)
Neutrophils Relative %: 58 %
Platelet Count: 103 K/uL — ABNORMAL LOW (ref 150–400)
RBC: 3.57 MIL/uL — ABNORMAL LOW (ref 3.87–5.11)
RDW: 13.5 % (ref 11.5–15.5)
WBC Count: 3.8 K/uL — ABNORMAL LOW (ref 4.0–10.5)
nRBC: 0 % (ref 0.0–0.2)

## 2024-03-05 LAB — MAGNESIUM: Magnesium: 1.8 mg/dL (ref 1.7–2.4)

## 2024-03-05 LAB — RESP PANEL BY RT-PCR (RSV, FLU A&B, COVID)  RVPGX2
Influenza A by PCR: NEGATIVE
Influenza B by PCR: NEGATIVE
Resp Syncytial Virus by PCR: NEGATIVE
SARS Coronavirus 2 by RT PCR: NEGATIVE

## 2024-03-05 LAB — CMP (CANCER CENTER ONLY)
ALT: 9 U/L (ref 0–44)
AST: 16 U/L (ref 15–41)
Albumin: 4.2 g/dL (ref 3.5–5.0)
Alkaline Phosphatase: 96 U/L (ref 38–126)
Anion gap: 8 (ref 5–15)
BUN: 15 mg/dL (ref 8–23)
CO2: 27 mmol/L (ref 22–32)
Calcium: 8.8 mg/dL — ABNORMAL LOW (ref 8.9–10.3)
Chloride: 106 mmol/L (ref 98–111)
Creatinine: 0.71 mg/dL (ref 0.44–1.00)
GFR, Estimated: >60 mL/min (ref >60)
Glucose, Bld: 111 mg/dL — ABNORMAL HIGH (ref 70–99)
Potassium: 3.5 mmol/L (ref 3.5–5.1)
Sodium: 141 mmol/L (ref 135–145)
Total Bilirubin: 0.4 mg/dL (ref 0.0–1.2)
Total Protein: 6.4 g/dL — ABNORMAL LOW (ref 6.5–8.1)

## 2024-03-05 LAB — LACTATE DEHYDROGENASE: LDH: 273 U/L — ABNORMAL HIGH (ref 98–192)

## 2024-03-05 LAB — FERRITIN: Ferritin: 451 ng/mL — ABNORMAL HIGH (ref 11–307)

## 2024-03-05 LAB — C-REACTIVE PROTEIN: CRP: 2.7 mg/dL — ABNORMAL HIGH (ref <1.0)

## 2024-03-05 MED ORDER — CIPROFLOXACIN HCL 250 MG PO TABS: 750.0000 mg | ORAL_TABLET | Freq: Two times a day (BID) | ORAL | 0 refills | Status: DC

## 2024-03-05 MED ORDER — SODIUM CHLORIDE 0.9 % IV SOLN: INTRAVENOUS | Status: DC

## 2024-03-05 MED ORDER — ACETAMINOPHEN 325 MG PO TABS
650.0000 mg | ORAL_TABLET | Freq: Once | ORAL | Status: AC
  Administered 2024-03-05: 650 mg via ORAL
  Filled 2024-03-05: qty 2

## 2024-03-05 NOTE — Telephone Encounter (Signed)
 This RN called and spoke with Ms. Busk regarding her chest XR results being negative for infection and her COVID and flu tests being negative. This RN informed Pt that blood culture results and UC/UA results were still pending and she would receive a phone call tomorrow once the results were finalized. Pt verbalized understanding and was agreeable with results and plan.

## 2024-03-05 NOTE — Progress Notes (Signed)
 Nutrition Assessment   Reason for Assessment: Referral    ASSESSMENT: 78 year old female with relapsed multiple myeloma. Autologous stem cell transplant 2016, allogenic stem cell transplant 2017. Patient completed CAR-T therapy with Abecma 04/24. Patient currently receiving maintenance Elrexfio  under the care of Dr. Federico.   Past medical history includes HTN, carotid stenosis, pericardial effusion, large hiatal hernia, GERD, chemo induced peripheral neuropathy, cervical disc disease, SCC of left temple, alopecia, insomnia, B12 deficiency, vit D deficiency, anxiety, fluid overload, anemia  Met with patient in infusion. She is not feeling well today, noted low grade fever (100.6) this morning on arrival to treatment. Patient is fatigued. Treatment held today. Covid swab negative. Patient to receive IVF. Additional labs, urinalysis, and CXR ordered.   Patient reports poor appetite due to taste. She has increased fatigue and too tired to cook some days. Patient reports poor digestion due to hernia. Endorses occasional episodes of vomiting prior nights dinner in the morning. She was tolerating cereal with lactaid milk for a while, but began having episodes of vomiting. Patient eats breakfast most days (coffee, egg or french toast). Goes to [employer] weekly for chicken salad and french onion soup. Patient eats a small portion of steak weekly. Last night patient ate spaghetti which tasted good. She does not tolerate milky based supplements.   Nutrition Focused Physical Exam: deferred     Medications: acyclovir , mepron , D3, cipro , lopressor , remeron , zofran , protonix , compazine , senna-s    Labs: reviewed    Anthropometrics:   Height: 5'2 Weight: 159 lb  UBW: 157-163 lb last 12 months BMI: 29.08   NUTRITION DIAGNOSIS: Inadequate oral intake related to treatment related side effects as evidenced by reported altered taste of foods and increased fatigue   INTERVENTION:  Educated on strategies  for altered taste - encouraged to lean into flavor profiles that are tolerable, offered suggestions Discussed importance of oral care, recommend brushing 2/day and suggested trying baking soda salt water rinses before meals - handout with recipe provided Encourage small frequent meals/snacks and choosing soft moist foods for ease of intake - snack ideas provided Suggested trying fairlife milk/chocolate milk (low sugar, lactose free, 13 g protein)  Suggested exploring prepared foods delivery options ([employer], [employer]) Contact information   MONITORING, EVALUATION, GOAL: wt trends, intake   Next Visit: Wednesday October 1 during infusion

## 2024-03-05 NOTE — Progress Notes (Signed)
 Symptom Management Consult Note [facility]    Patient Care Team: Patient, [name] as PCP - General (General Practice) Darron Deatrice LABOR, MD as PCP - Cardiology (Cardiology) Darren Liborio Backers, MD as Referring Physician (Hematology and Oncology) Guinevere File, MD as Consulting Physician (Internal Medicine) Fleeta Rothman, Jomarie SAILOR, MD as Consulting Physician (Infectious Diseases) Marda Maranda Foots An, MD as Consulting Physician (Hematology and Oncology) Honora Lenon Irving, NP as Nurse Practitioner (Hematology and Oncology) Lonn Hicks, MD as Consulting Physician (Hematology and Oncology)    Name / MRN / DOB: Jean Davidson  [id number]  03-11-1946   Date of visit: 03/05/2024   Chief Complaint/Reason for visit: fever   Current Therapy: Elrexifio  Last treatment:  Day 1   Cycle 6 on 02/27/24    ASSESSMENT AND PLAN Patient is a 78 y.o. female with oncologic history of relapsed/refractory IgA Lambda Multiple Myeloma followed by Dr. Federico.  I have viewed most recent oncology note and lab work.  #Relapsed/Refractory IgA Lambda Multiple Myeloma  - Completed induction therapy at [facility] - Next appointment with oncologist is 03/11/24  #Fever  - Fever persists with recent peak over 100F.  - CBC today showing stable leukopenia and anemia. ANC WNL at 2.2.. Patient well appearing, clear lung exam.  - Administer Tylenol  650 mg for fever. Administer IV fluids 500 mL for hydration support. - Labs collected today include LDH, CRP, ferritin, IL-6, blood cultures to work up possible CRS. Will also collect UA and urine culture and covid swab. - Chest x ray is negative for acute infectious process. Covid and flu testing also negative. - Will prescribe course of cipro  for bacterial coverage given fever and patient being immunocompromised.   Discussed plan with Dr. Federico.  Strict ED precautions discussed should symptoms worsen.   HEME/ONC HISTORY Oncology History  Overview Note  Multiple myeloma, Ig A Lambda, M spike 3.54 grams, Calcium  9.2, Creatinine 0.8, Beta 2 microglobulin 4.52, IgA 4840 mg/dL, lambda light chain 24.5, albumin 3.6, hemoglobin 9.7, platelet 115    Primary site: Multiple Myeloma   Staging method: AJCC 6th Edition   Clinical: Stage IIA signed by Hicks Lonn, MD on 11/07/2013  2:46 PM   Summary: Stage IIA S/p Allo transplant at [facility] on revlimid , pomalyst , daratumumab  and velcade      Multiple myeloma in relapse (HCC)  10/31/2013 Bone Marrow Biopsy   Bone marrow biopsy confirmed multiple myeloma with 40% bone marrow involvement. Skeletal survey showed minimal lesions in her score with generalized demineralization   11/10/2013 - 02/13/2014 Chemotherapy   The patient is started on induction chemotherapy with weekly dexamethasone  40 mg by mouth as well as Velcade  subcutaneous injection on days 1, 4, 8 and 11. On 11/21/2013, she was started on monthly Zometa .   12/23/2013 Adverse Reaction   The dose of Velcade  was reduced due to thrombocytopenia.   01/28/2014 - 04/07/2014 Chemotherapy   Revlimid  is added. Treatment was discontinued due to lack of response.   02/24/2014 - 04/07/2014 Chemotherapy   Due to worsening peripheral neuropathy, Velcade  injection is changed to once a week. Revlimid  was given 21 days on, 7 days off.   04/07/2014 - 04/10/2014 Chemotherapy   Revlimid  was discontinued due to lack of response. Chemotherapy was changed back to Velcade  injection twice a week, 2 weeks on 1 week off. Her treatment was switched to to minimum response   04/20/2014 - 06/02/2014 Chemotherapy   chemotherapy is switched to Carfilzomib , Cytoxan  and dexamethasone .  04/22/2014 Procedure   she has placement of port for chemotherapy.   06/01/2014 Tumor Marker   Bloodwork show that she has greater than partial response   06/23/2014 Bone Marrow Biopsy   Bone marrow biopsy show 5-10% residual plasma cells, normal cytogenetics and FISH    07/07/2014 Procedure   She had stem cell collection   07/22/2014 - 07/22/2014 Chemotherapy   She had high-dose chemotherapy with melphalan   07/23/2014 Bone Marrow Transplant   She had bone marrow transplant in autologous fashion at [name]   10/20/2014 - 03/24/2015 Chemotherapy    she received chemotherapy with Kyprolis , Revlimid  and dexamethasone    10/22/2014 Procedure   She has port placement   01/19/2015 Tumor Marker   IgA lambda M spike at 0.4 g    01/20/2015 Miscellaneous   IVIG 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 No cource was found and fever resolved with IV vancomycin  and meropenem    09/13/2015 Bone Marrow Biopsy   Bone marrow biopsy showed no increased blasts, 3-4 % plasma cells   03/02/2016 Bone Marrow Biopsy   Bone marrow biopsy at [facility] showed normocellular (30%) bone marrow with trilineage hematopoiesis. No significant increase in blasts. No significant increase in plasma cells.   05/12/2016 Imaging   DEXA scan at [facility] showed osteopenia   10/24/2016 Imaging   Skeletal survey at [facility], no new lesions   12/07/2017 Imaging   No focal abnormality noted to suggest myeloma. Exam is stable from prior exam.   03/01/2018 Procedure   Successful 8 French right internal jugular vein power port placement with its tip at the SVC/RA junction.   03/06/2018 -  Chemotherapy   The patient had daratumumab     07/09/2018 Bone Marrow Biopsy   Bone marrow biopsy at [facility] showed residual disease at 0.004% plasma cells   07/03/2019 Bone Marrow Biopsy   A.  Bone marrow, flow cytometric analysis for multiple myeloma minimal residual disease detection:   Negative. No phenotypically abnormal plasma cells at or above the limit of detection identified.     No monotypic B-cell population identified. Negative for increased blasts.   03/25/2021 Bone Marrow Biopsy   Repeat bone marrow biopsy at [facility] showed female donor karyotype.  5 to 7% plasma cell is seen.  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   04/20/2021 Echocardiogram   1. Left ventricular ejection fraction, by estimation, is 60 to 65%. The left ventricle has normal function. The left ventricle has no regional wall motion abnormalities. Left ventricular diastolic parameters were normal.  2. Right ventricular systolic function is normal. The right ventricular size is normal.  3. The mitral valve is normal in structure. No evidence of mitral valve regurgitation. No evidence of mitral stenosis.  4. The aortic valve is normal in structure. Aortic valve regurgitation is not visualized. No aortic stenosis is present.  5. The inferior vena cava is normal in size  with greater than 50% respiratory variability, suggesting right atrial pressure of 3 mmHg.   04/29/2021 - 02/17/2022 Chemotherapy   Patient is on Treatment Plan : MYELOMA RELAPSED/ REFRACTORY Carfilzomib  D1,8,15 (20/27) + Dexamethasone  (KPd) q28d     04/30/2021 - 09/28/2022 Chemotherapy   Patient is on Treatment Plan : MYELOMA RELAPSED/REFRACTORY Carfilzomib  (20/70) D1,8,15 + Dexamethasone  weekly (40) (Kd) q28d  x 9 cycles / Dexamethasone  D1,8,15     05/04/2021 PET scan   1. No evidence active multiple myeloma within the skeleton on FDG PET scan. 2. No suspicious lytic lesion on CT portion exam. 3. No plasmacytoma.   10/09/2023 -  Chemotherapy   Patient is on Treatment Plan : MYELOMA  WEEKLY elranatamab -bcmm SQ, step-up with weekly maintenance dosing then q14d dosing at week 25 if maintained response     01/02/2024 - 02/13/2024 Chemotherapy   Patient is on Treatment Plan : MYELOMA WEEKLY elranatamab -bcmm SQ, step-up with weekly maintenance dosing then q14d dosing at week 25 if maintained response     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HCC)  04/06/2015 Bone Marrow Biopsy   Accession: [id number] BM biopsy showed RAEB-1   04/06/2015 Tumor Marker   Cytogenetics and FISH for MDS are within normal limits   10/06/2015 - 10/10/2015 Chemotherapy   She received conditioning chemotherapy with busulfan and melphalan   10/12/2015 Bone Marrow Transplant   She received allogenic stem cell transplant   10/19/2015 Adverse Reaction   She developed posttransplant complication with mucositis, viral infection with rhinovirus, neutropenic fever, bilateral pleural effusion and moderate pericardial effusion and CMV reactivation.   10/31/2015 Miscellaneous   She has engrafted       INTERVAL HISTORY  Discussed the use of AI scribe software for clinical note transcription with the patient, who gave verbal consent to proceed.    Jean Davidson is a 78 y.o. female with oncologic history as above presenting to [facility] today with chief complaint of fever. Patient presents unaccompanied to visit today.  Patient reports she experiences fever typically on the day of her shot, sometimes persisting for a day or two, usually reaching around 99.77F to 99.90F. This fever resolves quickly with one dose of Tylenol . However, last week after treatment on  Wednesday 02/27/24, the fever continued into Saturday, Sunday, and Monday, and for the first time, it exceeded 100F this morning. She has been managing the fever with Tylenol , taking one dose over the weekend, but did not take any today.  She experiences shortness of breath, particularly during physical activities. Her heart rate increased to  110 bpm after walking a short distance per her smart watch. She reports sinus drainage and a morning cough, which she attributes to drainage, but states these symptoms are not worse than usual. She also experiences a slight tickle in her throat but no ongoing sore throat. She admits to feeling fatigued, denies myalgias or arthralgias. Denies rash. NO recent antibiotic use.  Regarding gastrointestinal symptoms, she mentions occasional upset stomach and diarrhea, which she attributes to her diet and possible lactose intolerance. Her taste buds are 'shot' and her bowel movements occur once every 2-3 days, with diarrhea not being overly different from her norm.  No urinary symptoms such as burning or increased frequency. No recent exposure to sick individuals or known COVID-19 exposures.  In terms of hydration, she consumes less than a 12-ounce bottle of Coke daily and minimal water intake. She occasionally flavors her water with lemonade to improve taste.    ROS  All other systems are reviewed  and are negative for acute change except as noted in the HPI.    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   Multiple myeloma (HCC)    Osteopenia    Pinched nerve    Sinusitis, bacterial 07/27/2015   Spinal stenosis in cervical region    Varicose veins of lower  extremities with inflammation      Past Surgical History:  Procedure Laterality Date   ANKLE SURGERY Left    BLADDER SUSPENSION  1988/1989   x2   COLONOSCOPY     ESOPHAGOGASTRODUODENOSCOPY     IR IMAGING GUIDED PORT INSERTION  03/01/2018   LIMBAL STEM CELL TRANSPLANT     x2   PORT-A-CATH REMOVAL     SHOULDER SURGERY Right 04/06/2013   right   UPPER GASTROINTESTINAL ENDOSCOPY      Social History   Socioeconomic History   Marital status: Divorced    Spouse name: Not on file   Number of children: Not on file   Years of education: Not on file   Highest education level: Not on file  Occupational History   Not on file  Tobacco Use   Smoking status: Former    Current packs/day: 0.00    Average packs/day: 0.5 packs/day for 4.0 years (2.0 ttl pk-yrs)    Types: Cigarettes    Start date: 06/26/1964    Quit date: 06/26/1968    Years since quitting: 55.7   Smokeless tobacco: Never  Vaping Use   Vaping status: Never Used  Substance and Sexual Activity   Alcohol  use: Yes    Alcohol /week: 1.0 standard drink of alcohol     Types: 1 Cans of beer per week    Comment: 1 per week per pt   Drug use: No   Sexual activity: Not on file  Other Topics Concern   Not on file  Social History Narrative   [name] is daughter tezra, mahr will,  full code ( reviewed 94)   Social Drivers of Health   Financial Resource Strain: Low Risk  (10/09/2023)   Received from [facility]   Overall Financial Resource Strain (CARDIA)    Difficulty of Paying Living Expenses: Not hard at all  Food Insecurity: No Food Insecurity (10/09/2023)   Received from [facility]   Hunger Vital Sign    Within the past 12 months, you worried that your food would run out before you got the money to buy more.: Never true    Within the past 12 months, the food you bought just didn't last and you didn't have money to get more.: Never true  Transportation Needs: Unknown (10/09/2023)    Received from [facility]   PRAPARE -  Transportation    In the past 12 months, has lack of transportation kept you from medical appointments or from getting medications?: No    Lack of Transportation (Non-Medical): Not on file  Physical Activity: Not on file  Stress: Not on file  Social Connections: Not on file  Intimate Partner Violence: Not on file    Family History  Problem Relation Age of Onset   Heart disease Mother    Heart failure Father    Heart disease Father    Throat cancer Father    Skin cancer Father    Diabetes Father    Kidney disease Father    Hypertension Brother    Heart disease Brother        congenital shunt, ?    Colon cancer Paternal Aunt 60   Skin cancer Brother    Stomach cancer Neg Hx    Esophageal cancer Neg Hx    Rectal cancer Neg Hx      Current Outpatient Medications:    acyclovir  (ZOVIRAX ) 400 MG tablet, Take 1 tablet (400 mg total) by mouth 2 (two) times daily., Disp: 180 tablet, Rfl: 1   aspirin  EC 81 MG tablet, Take 81 mg by mouth daily. Swallow whole., Disp: , Rfl:    atovaquone  (MEPRON ) 750 MG/5ML suspension, TAKE 10 MLS (1,500 MG TOTAL) BY MOUTH 2 (TWO) TIMES DAILY., Disp: 420 mL, Rfl: 3   cholecalciferol  (VITAMIN D3) 25 MCG (1000 UNIT) tablet, Take 4,000 Units by mouth daily. Instructed to increase to 4,000 units daily, Disp: , Rfl:    metoprolol  tartrate (LOPRESSOR ) 25 MG tablet, TAKE 1 & 1/2 TABLET BY MOUTH TWICE A DAY--due for follow up appointment., Disp: 270 tablet, Rfl: 3   mirtazapine  (REMERON ) 15 MG tablet, TAKE 1 TABLET BY MOUTH EVERYDAY AT BEDTIME, Disp: 90 tablet, Rfl: 1   Multiple Vitamins-Minerals (PRESERVISION AREDS 2 PO), Take 1 tablet by mouth 2 (two) times daily., Disp: , Rfl:    ondansetron  (ZOFRAN ) 8 MG tablet, Take 1 tablet (8 mg total) by mouth every 8 (eight) hours as needed (Nausea or vomiting). (Patient taking differently: Take 8 mg by mouth 2 (two) times daily.), Disp: 60 tablet, Rfl: 1    pantoprazole  (PROTONIX ) 40 MG tablet, TAKE 1 TABLET BY MOUTH 2 TIMES DAILY. TAKE 30-60 MINUTES BEFORE BREAKFAST AND DINNER., Disp: 180 tablet, Rfl: 0   prochlorperazine  (COMPAZINE ) 5 MG tablet, Take 5 mg by mouth every 6 (six) hours as needed for nausea or vomiting., Disp: , Rfl:    senna-docusate (SENOKOT-S) 8.6-50 MG tablet, Take 2 tablets by mouth daily as needed., Disp: , Rfl:   PHYSICAL EXAM ECOG FS:1 - Symptomatic but completely ambulatory    Vitals:   03/05/24 0920  BP: (!) 142/69  Pulse: 74  Resp: 20  Temp: (!) 100.6 F (38.1 C)  TempSrc: Oral  SpO2: 95%   Physical Exam Vitals and nursing note reviewed.  Constitutional:      Appearance: She is not ill-appearing or toxic-appearing.  HENT:     Head: Normocephalic.     Right Ear: External ear normal.     Left Ear: External ear normal.     Mouth/Throat:     Mouth: Mucous membranes are dry.     Pharynx: Oropharynx is clear. No oropharyngeal exudate or posterior oropharyngeal erythema.  Eyes:     General: No scleral icterus.    Conjunctiva/sclera: Conjunctivae normal.  Cardiovascular:     Rate and Rhythm: Normal rate and regular rhythm.  Pulses: Normal pulses.     Heart sounds: Normal heart sounds.  Pulmonary:     Effort: Pulmonary effort is normal.     Breath sounds: Normal breath sounds.  Abdominal:     General: There is no distension.     Tenderness: There is no abdominal tenderness.  Musculoskeletal:     Cervical back: Normal range of motion.  Skin:    General: Skin is warm and dry.  Neurological:     Mental Status: She is alert.        LABORATORY DATA I have reviewed the data as listed    Latest Ref Rng & Units 03/05/2024    7:57 AM 02/27/2024    8:41 AM 02/21/2024   12:04 PM  CBC  WBC 4.0 - 10.5 K/uL 3.8  2.8  3.0   Hemoglobin 12.0 - 15.0 g/dL 88.1  88.1  88.3   Hematocrit 36.0 - 46.0 % 35.2  35.9  34.9   Platelets 150 - 400 K/uL 103  121  114         Latest Ref Rng & Units 03/05/2024     7:57 AM 02/27/2024    8:41 AM 02/21/2024   12:04 PM  CMP  Glucose 70 - 99 mg/dL 888  897  89   BUN 8 - 23 mg/dL 15  18  14    Creatinine 0.44 - 1.00 mg/dL 9.28  9.29  9.29   Sodium 135 - 145 mmol/L 141  141  142   Potassium 3.5 - 5.1 mmol/L 3.5  3.9  3.5   Chloride 98 - 111 mmol/L 106  106  107   CO2 22 - 32 mmol/L 27  29  30    Calcium  8.9 - 10.3 mg/dL 8.8  8.7  8.8   Total Protein 6.5 - 8.1 g/dL 6.4  6.4  6.0   Total Bilirubin 0.0 - 1.2 mg/dL 0.4  0.4  0.3   Alkaline Phos 38 - 126 U/L 96  97  99   AST 15 - 41 U/L 16  17  15    ALT 0 - 44 U/L 9  13  11         RADIOGRAPHIC STUDIES (from last 24 hours if applicable) I have personally reviewed the radiological images as listed and agreed with the findings in the report. No results found.      Visit Diagnosis: 1. Fever, unspecified fever cause   2. Multiple myeloma in relapse (HCC)      No orders of the defined types were placed in this encounter.   All questions were answered. The patient knows to call the clinic with any problems, questions or concerns. No barriers to learning was detected.  A total of more than 30 minutes were spent on this encounter with face-to-face time and non-face-to-face time, including preparing to see the patient, ordering tests and/or medications, counseling the patient and coordination of care as outlined above.    Thank you for allowing me to participate in the care of this patient.    Tanaya Dunigan E  Walisiewicz, PA-C Department of Hematology/Oncology [facility] Phone: [phone]  [phone]    03/05/2024 9:22 AM

## 2024-03-05 NOTE — Patient Instructions (Addendum)
 Dehydration, Adult Dehydration is a condition in which there is not enough water or other fluids in the body. This happens when a person loses more fluids than they take in. Important organs, such as the kidneys, brain, and heart, cannot function without a proper amount of fluids. Any loss of fluids from the body can lead to dehydration. Dehydration can be mild, moderate, or severe. It should be treated right away to prevent it from becoming severe. What are the causes? Dehydration may be caused by: Health conditions, such as diarrhea, vomiting, fever, infection, or sweating or urinating a lot. Not drinking enough fluids. Certain medicines, such as medicines that remove excess fluid from the body (diuretics). Lack of safe drinking water. Not being able to get enough water and food. What increases the risk? The following factors may make you more likely to develop this condition: Having a long-term (chronic) illness that has not been treated properly, such as diabetes, heart disease, or kidney disease. Being 78 years of age or older. Having a disability. Living in a place that is high in altitude, where thinner, drier air causes more fluid loss. Doing exercises that put stress on your body for a long time (endurance sports). Being active in a hot climate. What are the signs or symptoms? Symptoms of dehydration depend on how severe it is. Mild or moderate dehydration Thirst. Dry lips or dry mouth. Dizziness or light-headedness. Muscle cramps. Dark urine. Urine may be the color of tea. Less urine or tears produced than usual. Headache. Severe dehydration Changes in skin. Your skin may be cold and clammy, blotchy, or pale. Your skin also may not return to normal after being lightly pinched and released. Little or no tears, urine, or sweat. Rapid breathing and low blood pressure. Your pulse may be weak or may be faster than 100 beats per minute when you are sitting still. Other changes,  such as: Feeling very thirsty. Sunken eyes. Cold hands and feet. Confusion. Being very tired (lethargic) or having trouble waking from sleep. Short-term weight loss. Loss of consciousness. How is this diagnosed? This condition is diagnosed based on your symptoms and a physical exam. You may have blood and urine tests to help confirm the diagnosis. How is this treated? Treatment for this condition depends on how severe it is. Treatment should be started right away. Do not wait until dehydration becomes severe. Severe dehydration is an emergency and needs to be treated in a hospital. Mild or moderate dehydration can be treated at home. You may be asked to: Drink more fluids. Drink an oral rehydration solution (ORS). This drink restores fluids, salts, and minerals in the blood (electrolytes). Stop any activities that caused dehydration, such as exercise. Cool off with cool compresses, cool mist, or cool fluids, if heat or too much sweat caused your condition. Take medicine to treat fever, if fever caused your condition. Take medicine to treat nausea and diarrhea, if vomiting or diarrhea caused your condition. Severe dehydration can be treated: With IV fluids. By correcting abnormal levels of electrolytes in your body. By treating the underlying cause of dehydration. Follow these instructions at home: Oral rehydration solution If told by your health care provider, drink an ORS: Make an ORS by following instructions on the package. Start by drinking small amounts, about  cup (120 mL) every 5-10 minutes. Slowly increase how much you drink until you have taken the amount recommended by your health care provider.  Eating and drinking  Drink enough clear fluid to keep  your urine pale yellow. If you were told to drink an ORS, finish the ORS first and then start slowly drinking other clear fluids. Drink fluids such as: Water. Do not drink only water. Doing that can lead to hyponatremia, which  is having too little salt (sodium) in the body. Water from ice chips you suck on. Diluted fruit juice. This is fruit juice that you have added water to. Low-calorie sports drinks. Eat foods that contain a healthy balance of electrolytes, such as bananas, oranges, potatoes, tomatoes, and spinach. Do not drink alcohol . Avoid the following: Drinks that contain a lot of sugar. These include high-calorie sports drinks, fruit juice that is not diluted, and soda. Caffeine. Foods that are greasy or contain a lot of fat or sugar. General instructions Take over-the-counter and prescription medicines only as told by your health care provider. Do not take sodium tablets. Doing that can lead to having too much sodium in the body (hypernatremia). Return to your normal activities as told by your health care provider. Ask your health care provider what activities are safe for you. Keep all follow-up visits. Your health care provider may need to check your progress and suggest new ways to treat your condition. Contact a health care provider if: You have muscle cramps, pain, or discomfort, such as: Pain in your abdomen and the pain gets worse or stays in one area. Stiff neck. You have a rash. You are more irritable than usual. You are sleepier or have a harder time waking. You feel weak or dizzy. You feel very thirsty. Get help right away if: You have symptoms of severe dehydration. You vomit every time you eat or drink. Your vomiting gets worse, does not go away, or includes blood or green matter (bile). You are getting treatment but symptoms are getting worse. You have a fever. You have a severe headache. You have: Diarrhea that gets worse or does not go away. Blood in your stool. This may cause stool to look black and tarry. Not urinating, or urinating only a small amount of very dark urine, within 6-8 hours. You have trouble breathing. These symptoms may be an emergency. Get help right  away. Do not wait to see if the symptoms will go away. Do not drive yourself to the hospital. Call 911. This information is not intended to replace advice given to you by your health care provider. Make sure you discuss any questions you have with your health care provider. Ciprofloxacin  Tablets What is this medication? CIPROFLOXACIN  (sip roe FLOX a sin) treats infections caused by bacteria. It belongs to a group of medications called quinolone antibiotics. It will not treat colds, the flu, or infections caused by viruses. This medicine may be used for other purposes; ask your health care provider or pharmacist if you have questions. COMMON BRAND NAME(S): Cipro  What should I tell my care team before I take this medication? They need to know if you have any of these conditions: Bone, joint, or tendon problems Diabetes Heart disease History of irregular heartbeat or rhythm Low levels of potassium or magnesium  in the blood Kidney disease Liver disease Myasthenia gravis Seizures Tingling of the fingers or toes or other nerve disorder An unusual or allergic reaction to ciprofloxacin , other medications, foods, dyes, or preservatives Pregnant or trying to get pregnant Breastfeeding How should I use this medication? Take this medication by mouth with a full glass of water. Take it as directed on the prescription label at the same time every day. Do  not crush or chew this medication. You may cut the tablet in half if it is scored (has a line in the middle of it). This may help you swallow the tablet if the whole tablet is too big. Be sure to take both halves. Do not take just one-half of the tablet. You can take it with or without food. If it upsets your stomach, take it with food. Take all of this medication unless your care team tells you to stop it early. Keep taking it even if you think you are better. Take products with aluminum, calcium , iron, magnesium , or zinc in them at a different time of  day than this medication. Take these products 6 hours BEFORE or 2 hours AFTER taking a dose of this medication. A special MedGuide will be given to you by the pharmacist with each prescription and refill. Be sure to read this information carefully each time. Talk to your care team about the use of this medication in children. Special care may be needed. Overdosage: If you think you have taken too much of this medicine contact a poison control center or emergency room at once. NOTE: This medicine is only for you. Do not share this medicine with others. What if I miss a dose? If you miss a dose, take it as soon as you can. If it is almost time for your next dose, take only that dose. Do not take double or extra doses. What may interact with this medication? Do not take this medication with any of the following: Cisapride Dronedarone Flibanserin Lomitapide Pimozide Thioridazine Tizanidine This medication may also interact with the following: Antacids Caffeine Certain medications for diabetes, such as glipizide, glyburide, or insulin Certain medications that treat or prevent blood clots, such as warfarin Clozapine Cyclosporine Didanosine buffered tablets or powder Dofetilide Duloxetine Estrogen or progestin hormones Lanthanum carbonate Lidocaine  Methotrexate Multivitamins NSAIDS, medications for pain and inflammation, such as ibuprofen or naproxen Olanzapine Omeprazole Other medications that cause heart rhythm changes Phenytoin Probenecid Ropinirole Sevelamer Sildenafil Sucralfate Theophylline Ziprasidone Zolpidem  This list may not describe all possible interactions. Give your health care provider a list of all the medicines, herbs, non-prescription drugs, or dietary supplements you use. Also tell them if you smoke, drink alcohol , or use illegal drugs. Some items may interact with your medicine. What should I watch for while using this medication? Visit your care team for  regular checks on your progress. Tell your care team if your symptoms do not start to get better or if they get worse. This medication may affect your coordination, reaction time, or judgment. Do not drive or operate machinery until you know how this medication affects you. Sit up or stand slowly to reduce the risk of dizzy or fainting spells. Drinking alcohol  with this medication can increase the risk of these side effects. Do not treat diarrhea with over the counter products. Contact your care team if you have diarrhea that lasts more than 2 days or if it is severe and watery. This medication can make you more sensitive to the sun. Keep out of the sun. If you cannot avoid being in the sun, wear protective clothing and sunscreen. Do not use sun lamps, tanning beds, or tanning booths. This medication may cause tendon problems. Tendons are the cords of tissue that connect your muscles to your bones. Tell your care team right away if you have pain, swelling, or stiffness while you are taking this medication or after you have stopped treatment. The risk is  higher in people older than 78 years of age, those taking steroid medications, and those who have had a kidney, heart, or lung transplant. This medication may worsen muscle weakness in people with myasthenia gravis. This can cause breathing problems. Call your care team right away if you have myasthenia gravis and have worsening symptoms while taking this medication. This medication may cause serious skin reactions. They can happen weeks to months after starting the medication. Contact your care team right away if you notice fevers or flu-like symptoms with a rash. The rash may be red or purple and then turn into blisters or peeling of the skin. You may also notice a red rash with swelling of the face, lips, or lymph nodes in your neck or under your arms. Tell your care team if you are taking medications to treat diabetes. This medication may cause changes to  blood sugar levels. Talk to your care team about how often to check your blood sugar while taking this medication. Know the symptoms of low blood sugar and how to treat it. What side effects may I notice from receiving this medication? Side effects that you should report to your care team as soon as possible: Allergic reactions--skin rash, itching, hives, swelling of the face, lips, tongue, or throat Heart rhythm changes--fast or irregular heartbeat, dizziness, feeling faint or lightheaded, chest pain, trouble breathing Increased pressure around the brain--severe headache, blurry vision, change in vision, nausea, vomiting Joint, muscle, or tendon pain, swelling, or stiffness Liver injury--right upper belly pain, loss of appetite, nausea, light-colored stool, dark yellow or brown urine, yellowing skin or eyes, unusual weakness or fatigue Mood and behavior changes--anxiety, nervousness, confusion, hallucinations, irritability, hostility, thoughts of suicide or self-harm, worsening mood, feelings of depression Pain, tingling, or numbness in the hands or feet Redness, blistering, peeling, or loosening of the skin, including inside the mouth Severe diarrhea, fever Seizures Sudden or severe chest, back, or stomach pain Unusual vaginal discharge, itching, or odor Side effects that usually do not require medical attention (report these to your care team if they continue or are bothersome): Diarrhea Dry mouth Headache Nausea Skin reactions on sun-exposed areas This list may not describe all possible side effects. Call your doctor for medical advice about side effects. You may report side effects to FDA at 1-800-FDA-1088. Where should I keep my medication? Keep out of the reach of children and pets. Store at room temperature below 30 degrees C (86 degrees F). Keep container tightly closed. Throw away any unused medication after the expiration date. NOTE: This sheet is a summary. It may not cover all  possible information. If you have questions about this medicine, talk to your doctor, pharmacist, or health care provider.  2024 Elsevier/Gold Standard (2023-05-25 00:00:00)  Document Revised: 01/09/2022 Document Reviewed: 01/09/2022 Elsevier Patient Education  2024 [employer].

## 2024-03-06 ENCOUNTER — Other Ambulatory Visit: Payer: Self-pay | Admitting: Internal Medicine

## 2024-03-06 ENCOUNTER — Encounter: Payer: Self-pay | Admitting: Hematology and Oncology

## 2024-03-06 LAB — MISC LABCORP TEST (SEND OUT)
LabCorp test name: 6
Labcorp test code: 140916

## 2024-03-06 LAB — URINE CULTURE

## 2024-03-07 ENCOUNTER — Telehealth: Payer: Self-pay

## 2024-03-07 ENCOUNTER — Encounter: Payer: Self-pay | Admitting: Hematology and Oncology

## 2024-03-07 NOTE — Telephone Encounter (Addendum)
 Spoke with pt via telephone to regarding [facility]'s response to pt's questions.  Stated that [employer] PA-C in [facility] stated that the blood cultures are negative but the pt's urine culture showed multiple species which is an indication for UTI.  Stated Mallie would like for the pt to continue taking the Ciprofloxacin  and Acetaminophen  for the fevers.  Also, instructed the pt to take Imodium  AD, probiotic, or eat yogurt for the diarrhea she's experiencing as a result of the Abx.  Instructed pt to increase her fluid intake by drinking Pedialyte, Liquid IV, or Gatorade to prevent dehydration as a result of the diarrhea.  Pt verbalized understanding of instructions.  Stated that Mallie would like for the pt to come in on 03/10/2024 to have a UA & UC done again.  Pt wanted to know if she could have this done on 03/11/2024 since she's already scheduled to come in on that day for tx.  Stated this nurse will f/u with Mallie and call pt back with her response.  Pt verbalized understanding and will await a return call.  @11 :32 on 03/07/2024 - Called pt to inform her that Mallie stated she can wait until 03/11/2024.

## 2024-03-07 NOTE — Telephone Encounter (Signed)
 Pt called stating she was seen on Wednesday in [facility].  Pt stated she's still experiencing elevated temperatures 100.1'F which temporarily resolves with Acetaminophen .  Pt stated her HR has also been elevated.  Pt stated that [facility] x2 were drawn on Wednesday but wants to know what should she do since her fevers has not resolved since being seen on Wednesday.  Pt does not want to go into the weekend with these symptoms and not knowing what's the cause of them.  Stated this nurse will f/u with Mallie ORN PA-C and Dr. Federico regarding the pt's call.

## 2024-03-08 ENCOUNTER — Other Ambulatory Visit: Payer: Self-pay

## 2024-03-08 ENCOUNTER — Inpatient Hospital Stay (HOSPITAL_COMMUNITY)
Admission: EM | Admit: 2024-03-08 | Discharge: 2024-03-21 | DRG: 871 | Disposition: A | Discharge status: Skilled Nursing Facility | Attending: Internal Medicine | Admitting: Internal Medicine

## 2024-03-08 ENCOUNTER — Inpatient Hospital Stay (HOSPITAL_COMMUNITY)

## 2024-03-08 ENCOUNTER — Emergency Department (HOSPITAL_COMMUNITY)

## 2024-03-08 DIAGNOSIS — Z79899 Other long term (current) drug therapy: Secondary | ICD-10-CM

## 2024-03-08 DIAGNOSIS — C9002 Multiple myeloma in relapse: Secondary | ICD-10-CM | POA: Diagnosis present

## 2024-03-08 DIAGNOSIS — Z808 Family history of malignant neoplasm of other organs or systems: Secondary | ICD-10-CM

## 2024-03-08 DIAGNOSIS — T451X5A Adverse effect of antineoplastic and immunosuppressive drugs, initial encounter: Secondary | ICD-10-CM | POA: Diagnosis not present

## 2024-03-08 DIAGNOSIS — Z66 Do not resuscitate: Secondary | ICD-10-CM | POA: Diagnosis present

## 2024-03-08 DIAGNOSIS — I10 Essential (primary) hypertension: Secondary | ICD-10-CM | POA: Diagnosis present

## 2024-03-08 DIAGNOSIS — A419 Sepsis, unspecified organism: Secondary | ICD-10-CM | POA: Diagnosis present

## 2024-03-08 DIAGNOSIS — D469 Myelodysplastic syndrome, unspecified: Secondary | ICD-10-CM | POA: Diagnosis present

## 2024-03-08 DIAGNOSIS — J189 Pneumonia, unspecified organism: Secondary | ICD-10-CM | POA: Diagnosis present

## 2024-03-08 DIAGNOSIS — Z7982 Long term (current) use of aspirin: Secondary | ICD-10-CM | POA: Diagnosis not present

## 2024-03-08 DIAGNOSIS — Z841 Family history of disorders of kidney and ureter: Secondary | ICD-10-CM

## 2024-03-08 DIAGNOSIS — R6520 Severe sepsis without septic shock: Secondary | ICD-10-CM | POA: Diagnosis present

## 2024-03-08 DIAGNOSIS — K449 Diaphragmatic hernia without obstruction or gangrene: Secondary | ICD-10-CM | POA: Diagnosis present

## 2024-03-08 DIAGNOSIS — R000 Tachycardia, unspecified: Principal | ICD-10-CM

## 2024-03-08 DIAGNOSIS — I340 Nonrheumatic mitral (valve) insufficiency: Secondary | ICD-10-CM | POA: Diagnosis present

## 2024-03-08 DIAGNOSIS — I5043 Acute on chronic combined systolic (congestive) and diastolic (congestive) heart failure: Secondary | ICD-10-CM | POA: Diagnosis present

## 2024-03-08 DIAGNOSIS — Z1152 Encounter for screening for COVID-19: Secondary | ICD-10-CM | POA: Diagnosis not present

## 2024-03-08 DIAGNOSIS — E872 Acidosis, unspecified: Secondary | ICD-10-CM | POA: Diagnosis present

## 2024-03-08 DIAGNOSIS — Z8249 Family history of ischemic heart disease and other diseases of the circulatory system: Secondary | ICD-10-CM | POA: Diagnosis not present

## 2024-03-08 DIAGNOSIS — Z751 Person awaiting admission to adequate facility elsewhere: Secondary | ICD-10-CM

## 2024-03-08 DIAGNOSIS — R338 Other retention of urine: Secondary | ICD-10-CM | POA: Diagnosis present

## 2024-03-08 DIAGNOSIS — I110 Hypertensive heart disease with heart failure: Secondary | ICD-10-CM | POA: Diagnosis present

## 2024-03-08 DIAGNOSIS — I5021 Acute systolic (congestive) heart failure: Secondary | ICD-10-CM | POA: Diagnosis not present

## 2024-03-08 DIAGNOSIS — D801 Nonfamilial hypogammaglobulinemia: Secondary | ICD-10-CM | POA: Diagnosis present

## 2024-03-08 DIAGNOSIS — C9000 Multiple myeloma not having achieved remission: Secondary | ICD-10-CM | POA: Diagnosis not present

## 2024-03-08 DIAGNOSIS — I471 Supraventricular tachycardia, unspecified: Secondary | ICD-10-CM | POA: Diagnosis present

## 2024-03-08 DIAGNOSIS — Z87891 Personal history of nicotine dependence: Secondary | ICD-10-CM

## 2024-03-08 DIAGNOSIS — R5381 Other malaise: Secondary | ICD-10-CM | POA: Diagnosis present

## 2024-03-08 DIAGNOSIS — R197 Diarrhea, unspecified: Secondary | ICD-10-CM | POA: Diagnosis present

## 2024-03-08 DIAGNOSIS — I5031 Acute diastolic (congestive) heart failure: Secondary | ICD-10-CM | POA: Diagnosis not present

## 2024-03-08 DIAGNOSIS — K219 Gastro-esophageal reflux disease without esophagitis: Secondary | ICD-10-CM | POA: Diagnosis present

## 2024-03-08 DIAGNOSIS — C9001 Multiple myeloma in remission: Secondary | ICD-10-CM | POA: Diagnosis present

## 2024-03-08 DIAGNOSIS — I4719 Other supraventricular tachycardia: Secondary | ICD-10-CM | POA: Diagnosis not present

## 2024-03-08 DIAGNOSIS — Z9484 Stem cells transplant status: Secondary | ICD-10-CM

## 2024-03-08 DIAGNOSIS — I5041 Acute combined systolic (congestive) and diastolic (congestive) heart failure: Secondary | ICD-10-CM | POA: Diagnosis not present

## 2024-03-08 DIAGNOSIS — I6523 Occlusion and stenosis of bilateral carotid arteries: Secondary | ICD-10-CM | POA: Diagnosis present

## 2024-03-08 DIAGNOSIS — Z91148 Patient's other noncompliance with medication regimen for other reason: Secondary | ICD-10-CM

## 2024-03-08 DIAGNOSIS — D6959 Other secondary thrombocytopenia: Secondary | ICD-10-CM | POA: Diagnosis present

## 2024-03-08 DIAGNOSIS — Z800 Family history of malignant neoplasm of digestive organs: Secondary | ICD-10-CM

## 2024-03-08 DIAGNOSIS — Z96611 Presence of right artificial shoulder joint: Secondary | ICD-10-CM | POA: Diagnosis present

## 2024-03-08 DIAGNOSIS — E876 Hypokalemia: Secondary | ICD-10-CM | POA: Diagnosis present

## 2024-03-08 DIAGNOSIS — I959 Hypotension, unspecified: Secondary | ICD-10-CM | POA: Diagnosis present

## 2024-03-08 DIAGNOSIS — G620 Drug-induced polyneuropathy: Secondary | ICD-10-CM | POA: Diagnosis present

## 2024-03-08 DIAGNOSIS — D6481 Anemia due to antineoplastic chemotherapy: Secondary | ICD-10-CM | POA: Diagnosis present

## 2024-03-08 DIAGNOSIS — M4802 Spinal stenosis, cervical region: Secondary | ICD-10-CM | POA: Diagnosis present

## 2024-03-08 DIAGNOSIS — J9601 Acute respiratory failure with hypoxia: Secondary | ICD-10-CM | POA: Diagnosis present

## 2024-03-08 DIAGNOSIS — Z833 Family history of diabetes mellitus: Secondary | ICD-10-CM

## 2024-03-08 LAB — I-STAT CG4 LACTIC ACID, ED
Lactic Acid, Venous: 2 mmol/L (ref 0.5–1.9)
Lactic Acid, Venous: 5.3 mmol/L (ref 0.5–1.9)
Lactic Acid, Venous: 1.8 mmol/L (ref 0.5–1.9)

## 2024-03-08 LAB — URINALYSIS, W/ REFLEX TO CULTURE (INFECTION SUSPECTED)
Bilirubin Urine: NEGATIVE
Glucose, UA: NEGATIVE mg/dL
Hgb urine dipstick: NEGATIVE
Ketones, ur: 5 mg/dL — AB
Leukocytes,Ua: NEGATIVE
Nitrite: NEGATIVE
Protein, ur: NEGATIVE mg/dL
Specific Gravity, Urine: 1.018 (ref 1.005–1.030)
pH: 5 (ref 5.0–8.0)

## 2024-03-08 LAB — RESP PANEL BY RT-PCR (RSV, FLU A&B, COVID)  RVPGX2
Influenza A by PCR: NEGATIVE
Influenza B by PCR: NEGATIVE
Resp Syncytial Virus by PCR: NEGATIVE
SARS Coronavirus 2 by RT PCR: NEGATIVE

## 2024-03-08 LAB — COMPREHENSIVE METABOLIC PANEL WITH GFR
ALT: 12 U/L (ref 0–44)
AST: 41 U/L (ref 15–41)
Albumin: 4.1 g/dL (ref 3.5–5.0)
Alkaline Phosphatase: 99 U/L (ref 38–126)
Anion gap: 15 (ref 5–15)
BUN: 11 mg/dL (ref 8–23)
CO2: 22 mmol/L (ref 22–32)
Calcium: 9.4 mg/dL (ref 8.9–10.3)
Chloride: 104 mmol/L (ref 98–111)
Creatinine, Ser: 0.78 mg/dL (ref 0.44–1.00)
GFR, Estimated: >60 mL/min (ref >60)
Glucose, Bld: 95 mg/dL (ref 70–99)
Potassium: 3.6 mmol/L (ref 3.5–5.1)
Sodium: 141 mmol/L (ref 135–145)
Total Bilirubin: 0.3 mg/dL (ref 0.0–1.2)
Total Protein: 6.4 g/dL — ABNORMAL LOW (ref 6.5–8.1)

## 2024-03-08 LAB — CBC WITH DIFFERENTIAL/PLATELET
Abs Immature Granulocytes: 0.07 K/uL (ref 0.00–0.07)
Basophils Absolute: 0 K/uL (ref 0.0–0.1)
Basophils Relative: 0 %
Eosinophils Absolute: 0.1 K/uL (ref 0.0–0.5)
Eosinophils Relative: 2 %
HCT: 36 % (ref 36.0–46.0)
Hemoglobin: 11.3 g/dL — ABNORMAL LOW (ref 12.0–15.0)
Immature Granulocytes: 2 %
Lymphocytes Relative: 38 %
Lymphs Abs: 1.6 K/uL (ref 0.7–4.0)
MCH: 32 pg (ref 26.0–34.0)
MCHC: 31.4 g/dL (ref 30.0–36.0)
MCV: 102 fL — ABNORMAL HIGH (ref 80.0–100.0)
Monocytes Absolute: 0.8 K/uL (ref 0.1–1.0)
Monocytes Relative: 18 %
Neutro Abs: 1.8 K/uL (ref 1.7–7.7)
Neutrophils Relative %: 40 %
Platelets: 112 K/uL — ABNORMAL LOW (ref 150–400)
RBC: 3.53 MIL/uL — ABNORMAL LOW (ref 3.87–5.11)
RDW: 14 % (ref 11.5–15.5)
WBC: 4.2 K/uL (ref 4.0–10.5)
nRBC: 0 % (ref 0.0–0.2)

## 2024-03-08 LAB — I-STAT CHEM 8, ED
BUN: 14 mg/dL (ref 8–23)
Calcium, Ion: 1.12 mmol/L — ABNORMAL LOW (ref 1.15–1.40)
Chloride: 105 mmol/L (ref 98–111)
Creatinine, Ser: 0.8 mg/dL (ref 0.44–1.00)
Glucose, Bld: 97 mg/dL (ref 70–99)
HCT: 33 % — ABNORMAL LOW (ref 36.0–46.0)
Hemoglobin: 11.2 g/dL — ABNORMAL LOW (ref 12.0–15.0)
Potassium: 4.4 mmol/L (ref 3.5–5.1)
Sodium: 140 mmol/L (ref 135–145)
TCO2: 25 mmol/L (ref 22–32)

## 2024-03-08 LAB — D-DIMER, QUANTITATIVE: D-Dimer, Quant: 1.99 ug{FEU}/mL — ABNORMAL HIGH (ref 0.00–0.50)

## 2024-03-08 LAB — RESPIRATORY PANEL BY PCR

## 2024-03-08 LAB — PROTIME-INR
INR: 1.1 (ref 0.8–1.2)
Prothrombin Time: 14.6 s (ref 11.4–15.2)

## 2024-03-08 LAB — PROCALCITONIN: Procalcitonin: <0.1 ng/mL

## 2024-03-08 LAB — LACTIC ACID, PLASMA: Lactic Acid, Venous: 3 mmol/L (ref 0.5–1.9)

## 2024-03-08 MED ORDER — POLYETHYLENE GLYCOL 3350 17 G PO PACK: 17.0000 g | PACK | Freq: Every day | ORAL | Status: DC | PRN

## 2024-03-08 MED ORDER — PROCHLORPERAZINE EDISYLATE 10 MG/2ML IJ SOLN
INTRAMUSCULAR | Status: AC
  Filled 2024-03-08: qty 2

## 2024-03-08 MED ORDER — METOPROLOL TARTRATE 5 MG/5ML IV SOLN
5.0000 mg | INTRAVENOUS | Status: DC | PRN
  Administered 2024-03-08 – 2024-03-10 (×2): 5 mg via INTRAVENOUS
  Filled 2024-03-08 (×2): qty 5

## 2024-03-08 MED ORDER — GUAIFENESIN-DM 100-10 MG/5ML PO SYRP
5.0000 mL | ORAL_SOLUTION | ORAL | Status: DC | PRN
  Filled 2024-03-08: qty 10

## 2024-03-08 MED ORDER — LACTATED RINGERS IV SOLN
INTRAVENOUS | Status: DC
Start: 2024-03-08 — End: 2024-03-09

## 2024-03-08 MED ORDER — PROCHLORPERAZINE EDISYLATE 10 MG/2ML IJ SOLN
5.0000 mg | Freq: Four times a day (QID) | INTRAMUSCULAR | Status: AC | PRN
  Administered 2024-03-08: 5 mg via INTRAVENOUS

## 2024-03-08 MED ORDER — LACTATED RINGERS IV BOLUS
500.0000 mL | Freq: Once | INTRAVENOUS | Status: AC
  Administered 2024-03-08: 500 mL via INTRAVENOUS

## 2024-03-08 MED ORDER — HYDROXYZINE HCL 10 MG PO TABS
10.0000 mg | ORAL_TABLET | Freq: Once | ORAL | Status: AC | PRN
  Administered 2024-03-08: 10 mg via ORAL
  Filled 2024-03-08: qty 1

## 2024-03-08 MED ORDER — LACTATED RINGERS IV BOLUS (SEPSIS)
1000.0000 mL | Freq: Once | INTRAVENOUS | Status: AC
  Administered 2024-03-08: 1000 mL via INTRAVENOUS

## 2024-03-08 MED ORDER — ONDANSETRON HCL 4 MG/2ML IJ SOLN
4.0000 mg | Freq: Four times a day (QID) | INTRAMUSCULAR | Status: DC | PRN
  Administered 2024-03-08 – 2024-03-15 (×3): 4 mg via INTRAVENOUS
  Filled 2024-03-08 (×3): qty 2

## 2024-03-08 MED ORDER — LEVALBUTEROL HCL 0.63 MG/3ML IN NEBU
0.6300 mg | INHALATION_SOLUTION | RESPIRATORY_TRACT | Status: DC | PRN
  Administered 2024-03-08: 0.63 mg via RESPIRATORY_TRACT
  Filled 2024-03-08: qty 3

## 2024-03-08 MED ORDER — ATOVAQUONE 750 MG/5ML PO SUSP
1500.0000 mg | Freq: Two times a day (BID) | ORAL | Status: DC
  Administered 2024-03-08 – 2024-03-20 (×24): 1500 mg via ORAL
  Filled 2024-03-08 (×25): qty 10

## 2024-03-08 MED ORDER — VITAMIN D 25 MCG (1000 UNIT) PO TABS
4000.0000 [IU] | ORAL_TABLET | Freq: Every day | ORAL | Status: DC
  Administered 2024-03-08 – 2024-03-20 (×13): 4000 [IU] via ORAL
  Filled 2024-03-08 (×14): qty 4

## 2024-03-08 MED ORDER — PROSIGHT PO TABS
2.0000 | ORAL_TABLET | Freq: Two times a day (BID) | ORAL | Status: DC
  Administered 2024-03-08 – 2024-03-20 (×25): 2 via ORAL
  Filled 2024-03-08 (×26): qty 2

## 2024-03-08 MED ORDER — ACETAMINOPHEN 325 MG PO TABS: 650.0000 mg | ORAL_TABLET | Freq: Four times a day (QID) | ORAL | Status: DC | PRN

## 2024-03-08 MED ORDER — VANCOMYCIN HCL IN DEXTROSE 1-5 GM/200ML-% IV SOLN
1000.0000 mg | Freq: Once | INTRAVENOUS | Status: AC
  Administered 2024-03-08: 1000 mg via INTRAVENOUS
  Filled 2024-03-08: qty 200

## 2024-03-08 MED ORDER — SODIUM CHLORIDE 0.9 % IV SOLN
2.0000 g | Freq: Once | INTRAVENOUS | Status: AC
  Administered 2024-03-08: 2 g via INTRAVENOUS
  Filled 2024-03-08 (×2): qty 12.5

## 2024-03-08 MED ORDER — PANTOPRAZOLE SODIUM 40 MG PO TBEC
40.0000 mg | DELAYED_RELEASE_TABLET | Freq: Every day | ORAL | Status: DC
  Administered 2024-03-08 – 2024-03-20 (×13): 40 mg via ORAL
  Filled 2024-03-08 (×13): qty 1

## 2024-03-08 MED ORDER — LACTATED RINGERS IV BOLUS (SEPSIS)
250.0000 mL | Freq: Once | INTRAVENOUS | Status: AC
  Administered 2024-03-08: 250 mL via INTRAVENOUS

## 2024-03-08 MED ORDER — LACTATED RINGERS IV BOLUS
1000.0000 mL | Freq: Once | INTRAVENOUS | Status: AC
  Administered 2024-03-08: 1000 mL via INTRAVENOUS

## 2024-03-08 MED ORDER — ACYCLOVIR 400 MG PO TABS
400.0000 mg | ORAL_TABLET | Freq: Two times a day (BID) | ORAL | Status: DC
  Administered 2024-03-08 – 2024-03-20 (×25): 400 mg via ORAL
  Filled 2024-03-08 (×25): qty 1

## 2024-03-08 MED ORDER — ACETAMINOPHEN 650 MG RE SUPP: 650.0000 mg | Freq: Four times a day (QID) | RECTAL | Status: DC | PRN

## 2024-03-08 MED ORDER — MELATONIN 5 MG PO TABS
5.0000 mg | ORAL_TABLET | Freq: Every evening | ORAL | Status: DC | PRN
  Administered 2024-03-09: 5 mg via ORAL
  Filled 2024-03-08 (×2): qty 1

## 2024-03-08 MED ORDER — METRONIDAZOLE 500 MG/100ML IV SOLN
500.0000 mg | Freq: Once | INTRAVENOUS | Status: AC
  Administered 2024-03-08: 500 mg via INTRAVENOUS
  Filled 2024-03-08: qty 100

## 2024-03-08 MED ORDER — VANCOMYCIN HCL IN DEXTROSE 1-5 GM/200ML-% IV SOLN
1000.0000 mg | INTRAVENOUS | Status: DC
  Administered 2024-03-09 – 2024-03-10 (×2): 1000 mg via INTRAVENOUS
  Filled 2024-03-08 (×2): qty 200

## 2024-03-08 MED ORDER — HYDRALAZINE HCL 25 MG PO TABS: 25.0000 mg | ORAL_TABLET | ORAL | Status: DC | PRN

## 2024-03-08 MED ORDER — MIRTAZAPINE 15 MG PO TABS
15.0000 mg | ORAL_TABLET | Freq: Every day | ORAL | Status: DC
  Administered 2024-03-08 – 2024-03-20 (×13): 15 mg via ORAL
  Filled 2024-03-08 (×14): qty 1

## 2024-03-08 MED ORDER — ALBUTEROL SULFATE (2.5 MG/3ML) 0.083% IN NEBU
2.5000 mg | INHALATION_SOLUTION | RESPIRATORY_TRACT | Status: DC | PRN
  Administered 2024-03-08: 2.5 mg via RESPIRATORY_TRACT
  Filled 2024-03-08: qty 3

## 2024-03-08 MED ORDER — ONDANSETRON HCL 4 MG PO TABS
4.0000 mg | ORAL_TABLET | Freq: Four times a day (QID) | ORAL | Status: DC | PRN
  Administered 2024-03-14: 4 mg via ORAL
  Filled 2024-03-08: qty 1

## 2024-03-08 MED ORDER — METRONIDAZOLE 500 MG/100ML IV SOLN
500.0000 mg | Freq: Two times a day (BID) | INTRAVENOUS | Status: AC
  Administered 2024-03-08 – 2024-03-12 (×9): 500 mg via INTRAVENOUS
  Filled 2024-03-08 (×9): qty 100

## 2024-03-08 MED ORDER — SODIUM CHLORIDE 0.9 % IV SOLN
2.0000 g | Freq: Two times a day (BID) | INTRAVENOUS | Status: AC
  Administered 2024-03-08 – 2024-03-12 (×8): 2 g via INTRAVENOUS
  Filled 2024-03-08 (×9): qty 12.5

## 2024-03-08 MED ORDER — SODIUM CHLORIDE 0.9% FLUSH: 10.0000 mL | INTRAVENOUS | Status: DC | PRN

## 2024-03-08 MED ORDER — LABETALOL HCL 5 MG/ML IV SOLN: 10.0000 mg | INTRAVENOUS | Status: DC | PRN

## 2024-03-08 MED ORDER — ASPIRIN 81 MG PO TBEC
81.0000 mg | DELAYED_RELEASE_TABLET | Freq: Every day | ORAL | Status: DC
  Administered 2024-03-08 – 2024-03-20 (×13): 81 mg via ORAL
  Filled 2024-03-08 (×13): qty 1

## 2024-03-08 MED ORDER — IOHEXOL 350 MG/ML SOLN
75.0000 mL | Freq: Once | INTRAVENOUS | Status: AC | PRN
  Administered 2024-03-09: 75 mL via INTRAVENOUS

## 2024-03-08 NOTE — Assessment & Plan Note (Addendum)
-   History of chronic diarrhea - Stool studies ordered on admission but no significant diarrhea therefore canceled. No abdominal pain noted - Also recently on ciprofloxacin  for a couple days after concern for outpatient UTI.  Urine culture from 03/05/2024 negative for growth (multiple species present)--- blood cultures at that time also negative to date  - May just be having some diarrhea from antibiotics that were started on admission as well.  No abdominal pain and remains afebrile -Okay to rotate with Imodium  and Lomotil

## 2024-03-08 NOTE — ED Notes (Addendum)
 Pt complaining of SOB and nausea. Pt visibly out of breath. Spo2 at 72%. Albuterol  breathing treatment started and IV Zofran  PRN given. MD notified, charge notified. Respiratory called

## 2024-03-08 NOTE — H&P (Signed)
 History and Physical    Jean Davidson FMW:[id number] DOB: 05/12/1946 DOA: 03/08/2024  DOS: the patient was seen and examined on 03/08/2024  PCP: Patient, [name]   Patient coming from: Home  I have personally briefly reviewed patient's old medical records in [facility] and CareEverywhere  HPI:   Jean Davidson is a 78 y.o. year old female with past medical history of mild dysplastic syndrome, hypertension, GERD, hiatal hernia, carotid stenosis, and insomnia. She presents to [facility], ED with dyspnea, tachycardia, chills, myalgias and fever.  She was seen at the cancer center earlier this week for fever Tmax 100.6 at that time and urinalysis consistent with UTI and placed on ciprofloxacin .  Blood cultures drawn at that appointment are no growth to date and urine culture with contamination.  She reports compliance with ciprofloxacin  as prescribed.  Today she noted that she would experience increased dyspnea, nonproductive cough, and URI symptoms.  Her Fitbit at home noted her heart rate as high as 150 and called 911.  She endorses chronic diarrhea that is unchanged.  She denies hemoptysis, chest pain, dysuria, urinary frequency, urinary urgency, abdominal pain, flank pain, nausea, or vomiting.   ED Course: On arrival to [facility], In ED patient was noted to be afebrile temp 36.9 C BP 109/67, HR 104, RR 18, SpO2 89% on room air.  She was placed on 2 L via nasal cannula with improvement in SpO2 to 96%.  Labs notable for COVID, flu, RSV negative, respiratory 20 pathogen panel collected in process, hemoglobin 11.9, platelets 112, lactic acid 2.0--> 5.3.  Blood cultures collected and currently in process.  She was given 2250 mL LR and started on continuous LR infusion at 150 mL/h.  She was also covered broadly with cefepime , Flagyl , vancomycin . TRH contacted for admission.  Review of Systems: As mentioned in the history of present illness. All other systems reviewed and are negative.  Review  of Systems  Constitutional:  Positive for chills, fever and malaise/fatigue. Negative for weight loss.  HENT:  Positive for congestion. Negative for sinus pain.   Respiratory:  Positive for cough and shortness of breath. Negative for sputum production and wheezing.   Cardiovascular:  Negative for chest pain and palpitations.  Gastrointestinal:  Positive for diarrhea. Negative for abdominal pain, constipation, heartburn, nausea and vomiting.  Genitourinary:  Negative for dysuria, flank pain, frequency and urgency.  Neurological:  Positive for weakness. Negative for dizziness, focal weakness and headaches.    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 Her smoking use included cigarettes.  She started smoking about 59 years ago. She has a 2 pack-year smoking history. She has never used smokeless tobacco. She reports current alcohol  use of about 1.0 standard drink of alcohol  per week. She reports that she does not use drugs.  No Known Allergies  Family History  Problem Relation Age of Onset   Heart disease Mother    Heart failure Father    Heart disease Father    Throat cancer Father    Skin cancer Father    Diabetes Father    Kidney disease Father    Hypertension Brother    Heart disease Brother        congenital shunt, ?    Colon cancer Paternal Aunt 67   Skin cancer Brother    Stomach cancer Neg Hx    Esophageal cancer Neg Hx    Rectal cancer Neg Hx     Prior to Admission medications   Medication Sig Start Date End Date Taking? Authorizing Provider  acyclovir  (ZOVIRAX ) 400 MG tablet Take 1 tablet (400 mg total) by mouth 2 (two) times daily. 08/07/23   Lonn Hicks, MD  aspirin  EC 81 MG tablet Take 81 mg by mouth daily. Swallow whole.    [provider]  atovaquone  (MEPRON ) 750 MG/5ML suspension TAKE 10 MLS (1,500 MG TOTAL) BY MOUTH 2 (TWO) TIMES DAILY. 03/04/24   Dorsey, John T IV, MD  cholecalciferol  (VITAMIN D3) 25 MCG (1000 UNIT) tablet Take 4,000 Units by mouth daily. Instructed to increase to 4,000 units daily    [provider]  ciprofloxacin  (CIPRO ) 250 MG tablet Take 3 tablets (750 mg total) by mouth 2 (two) times daily for 7 days. 03/05/24 03/12/24  Walisiewicz, Kaitlyn E, PA-C  metoprolol  tartrate (LOPRESSOR ) 25 MG tablet TAKE 1 & 1/2 TABLET BY MOUTH TWICE A DAY--due for follow up appointment. 08/01/23   Furth, Cadence H, PA-C  mirtazapine  (REMERON ) 15 MG tablet TAKE 1 TABLET BY MOUTH EVERYDAY AT BEDTIME 01/28/24   Dorsey, John T IV, MD  Multiple Vitamins-Minerals (PRESERVISION AREDS 2 PO) Take 1 tablet by mouth 2 (two) times daily.    [provider]  ondansetron  (ZOFRAN ) 8 MG tablet Take 1 tablet (8 mg total) by mouth every 8 (eight)  hours as needed (Nausea or vomiting). Patient taking differently: Take 8 mg by mouth 2 (two) times daily. 09/26/22   Lonn Hicks, MD  pantoprazole  (PROTONIX ) 40 MG tablet TAKE 1 TABLET BY MOUTH 2 TIMES DAILY. TAKE 30-60 MINUTES BEFORE BREAKFAST AND DINNER. 03/06/24   Avram Lupita BRAVO, MD  prochlorperazine  (COMPAZINE ) 5 MG tablet Take 5 mg by mouth every 6 (six) hours as needed for nausea or vomiting.    [provider]  senna-docusate (SENOKOT-S) 8.6-50 MG tablet Take 2 tablets by mouth daily as needed. 10/23/22   [provider]    Physical Exam: Vitals:   03/08/24 1106 03/08/24 1115  BP: 109/67   Pulse: ROLLEN)  104 (!) 101  Resp: 18   Temp: 98.4 F (36.9 C)   TempSrc: Oral   SpO2: (!) 89% 96%  Weight: 72.6 kg   Height: 5' 2 (1.575 m)     Physical Exam Vitals and nursing note reviewed.  Constitutional:      Appearance: Normal appearance.  HENT:     Head: Normocephalic.     Nose: Congestion present.  Eyes:     Pupils: Pupils are equal, round, and reactive to light.  Cardiovascular:     Rate and Rhythm: Normal rate and regular rhythm.     Pulses: Normal pulses.     Heart sounds: No murmur heard.    No friction rub. No gallop.  Pulmonary:     Effort: Tachypnea present.     Breath sounds: Rhonchi present. No wheezing or rales.  Chest:     Chest wall: No tenderness.  Abdominal:     General: Bowel sounds are normal. There is no distension.     Palpations: Abdomen is soft.     Tenderness: There is no abdominal tenderness. There is no guarding.  Skin:    General: Skin is warm and dry.     Capillary Refill: Capillary refill takes less than 2 seconds.  Neurological:     General: No focal deficit present.     Mental Status: She is alert and oriented to person, place, and time. Mental status is at baseline.       Labs on Admission: I have personally reviewed following labs and imaging studies  CBC: Recent Labs  Lab 03/05/24 0757 03/08/24 1138 03/08/24 1216   WBC 3.8* 4.2  --   NEUTROABS 2.2 1.8  --   HGB 11.8* 11.3* 11.2*  HCT 35.2* 36.0 33.0*  MCV 98.6 102.0*  --   PLT 103* 112*  --    Basic Metabolic Panel: Recent Labs  Lab 03/05/24 0757 03/08/24 1138 03/08/24 1216  NA 141 141 140  K 3.5 3.6 4.4  CL 106 104 105  CO2 27 22  --   GLUCOSE 111* 95 97  BUN 15 11 14   CREATININE 0.71 0.78 0.80  CALCIUM  8.8* 9.4  --   MG 1.8  --   --    GFR: Estimated Creatinine Clearance: 54.9 mL/min (by C-G formula based on SCr of 0.8 mg/dL). Liver Function Tests: Recent Labs  Lab 03/05/24 0757 03/08/24 1138  AST 16 41  ALT 9 12  ALKPHOS 96 99  BILITOT 0.4 0.3  PROT 6.4* 6.4*  ALBUMIN 4.2 4.1   No results for input(s): LIPASE, AMYLASE in the last 168 hours. No results for input(s): AMMONIA in the last 168 hours. Coagulation Profile: Recent Labs  Lab 03/08/24 1340  INR 1.1   Cardiac Enzymes: No results for input(s): CKTOTAL, CKMB, CKMBINDEX, TROPONINI, TROPONINIHS in the last 168 hours. BNP (last 3 results) No results for input(s): BNP in the last 8760 hours. HbA1C: No results for input(s): HGBA1C in the last 72 hours. CBG: No results for input(s): GLUCAP in the last 168 hours. Lipid Profile: No results for input(s): CHOL, HDL, LDLCALC, TRIG, CHOLHDL, LDLDIRECT in the last 72 hours. Thyroid  Function Tests: No results for input(s): TSH, T4TOTAL, FREET4, T3FREE, THYROIDAB in the last 72 hours. Anemia Panel: No results for input(s): VITAMINB12, FOLATE, FERRITIN, TIBC, IRON, RETICCTPCT in the last 72 hours. Urine analysis:    Component Value Date/Time   COLORURINE AMBER (A) 03/05/2024 1512   APPEARANCEUR CLOUDY (A) 03/05/2024 1512   LABSPEC  1.031 (H) 03/05/2024 1512   LABSPEC 1.020 07/27/2015 1158   PHURINE 5.0 03/05/2024 1512   GLUCOSEU NEGATIVE 03/05/2024 1512   GLUCOSEU Negative 07/27/2015 1158   HGBUR NEGATIVE 03/05/2024 1512   BILIRUBINUR NEGATIVE 03/05/2024 1512    BILIRUBINUR Negative 07/27/2015 1158   KETONESUR NEGATIVE 03/05/2024 1512   PROTEINUR 30 (A) 03/05/2024 1512   UROBILINOGEN 0.2 07/27/2015 1158   NITRITE NEGATIVE 03/05/2024 1512   LEUKOCYTESUR LARGE (A) 03/05/2024 1512   LEUKOCYTESUR Trace 07/27/2015 1158    Radiological Exams on Admission: I have personally reviewed images DG Chest Port 1 View Result Date: 03/08/2024 EXAM: 1 VIEW XRAY OF THE CHEST 03/08/2024 11:37:42 AM COMPARISON: 03/05/2024 CLINICAL HISTORY: Questionable sepsis - evaluate for abnormality. Per chart: tachycardia. She is currently receiving chemo. Was seen last week for fevers at outpatient office. 89% on RA during triage. FINDINGS: LUNGS AND PLEURA: Calcified right lower lobe granuloma. No focal pulmonary opacity. No pulmonary edema. No pleural effusion. No pneumothorax. HEART AND MEDIASTINUM: Aortic calcification. No acute abnormality of the cardiac and mediastinal silhouettes. BONES AND SOFT TISSUES: Right shoulder arthroplasty noted. Old healed right rib fractures. Degenerative changes of spine. No acute osseous abnormality. IMPRESSION: 1. No acute findings. Electronically signed by: Waddell Calk MD 03/08/2024 12:10 PM EDT RP Workstation: [id number]    EKG: My personal interpretation of EKG shows: NSR HR 90    Assessment/Plan Active Problems:   Sepsis (HCC)   #Acute Hypoxic Respiratory Failure - Supplemental O2 for SpO2 goal greater than 92%, wean as able - As needed nebulizers - Add 20 Pathogen panel - Check D-dimer and CTA PE - Pulmonary toilet: Mobilize/incentive spirometry/flutter valve   #Severe Sepsis, unknown source End organ dysfunction: Lactic Acid 2.0-->5.3; New O2 requirement SIRs Criteria: HR 104 - Continue broad spectrum coverage with vancomycin , cefepime , Flagyl  - s/p LR bolus in the ED, will give 1 additional liter and then continue LR at 150 mL/h - Follow Urine and blood cultures - Follow Fever curve - Check PCT  #Hypertension -  Hold home antihypertensives in setting of sepsis  #Multiple Myeloma Noted -Outpatient follow up  #GERD - Protonix    VTE prophylaxis:  SCDs  GI prophylaxis: Protonix  Diet: Regular Access: PIV Lines:  Code Status:  DNR/DNI(Do NOT Intubate)  confirmed with patient at bedside Telemetry: Yes  Admission status: Inpatient, Telemetry bed Patient is from: Home  Anticipated d/c is to: Home  Anticipated d/c date is: 2-3 days Patient currently: Receiving IV antbiotics, IVF hydration, on supplemental O2, and pending CTA PE    Family Communication: Daughter, Tashonda Pinkus updated via phone at [phone]  Consults called: N/A  Severity of Illness: The appropriate patient status for this patient is INPATIENT. Inpatient status is judged to be reasonable and necessary in order to provide the required intensity of service to ensure the patient's safety. The patient's presenting symptoms, physical exam findings, and initial radiographic and laboratory data in the context of their chronic comorbidities is felt to place them at high risk for further clinical deterioration. Furthermore, it is not anticipated that the patient will be medically stable for discharge from the hospital within 2 midnights of admission.   * I certify that at the point of admission it is my clinical judgment that the patient will require inpatient hospital care spanning beyond 2 midnights from the point of admission due to high intensity of service, high risk for further deterioration and high frequency of surveillance required.*  To reach the provider On-Call:   7AM- 7PM  see care teams to locate the attending and reach out to them via [email or web address]. Password: [email or web address] 7PM-7AM contact night-coverage If you still have difficulty reaching the appropriate provider, please page the [facility] (Director on Call) for [facility] on amion for assistance  This document was prepared using Conservation officer, historic buildings and may include  unintentional dictation errors.  Rockie Rams FNP-BC, PMHNP-BC Nurse Practitioner [facility]

## 2024-03-08 NOTE — Assessment & Plan Note (Addendum)
-   Etiology confirmed after CTA chest as severe multifocal pneumonia -PE ruled out on CTA chest - continue O2 as necessary; unable to intubate given code status so trying to support aggressively otherwise - HFNC, wean as able - okay with BiPAP for WOB at times; pulmonology recommending BiPAP at night -Solu-Medrol  transitioned to Solu-Cortef  for 4 days on 03/10/2024

## 2024-03-08 NOTE — Assessment & Plan Note (Addendum)
-   Tachycardia, neutropenia recently, lactic acidosis; suspected pulm source - CT angio chest confirmed multifocal severe pneumonia -Continue broad-spectrum antibiotics (vanc, cefepime , flagyl ) - PCCM added azithro x 3 days on 9/15 - ID consulted 9/16 after oncology discussed case with bispecific committee; patient has been noncompliant with atovaquone  at home due to taste (takes every couple days)

## 2024-03-08 NOTE — Assessment & Plan Note (Signed)
-   Stable hemoglobin 11 to 12 g/dL

## 2024-03-08 NOTE — Hospital Course (Addendum)
 Jean Davidson is a 78 year old female with past medical history of relapsed/Refractory IgA Lambda Multiple Myeloma, In Remission, GERD, HTN, macular degeneration, cervical spinal stenosis presented to hospital with shortness of breath.  Patient was recently seen as outpatient for low-grade fever and was presumed to have UTI and received ciprofloxacin .  On this presentation she presented with progressive shortness of breath nonproductive cough and low-grade fever of 101.  In the ED patient was slightly tachycardic and needed supplemental oxygen .  Chest x-ray did not show any infiltrate.  Subsequently CT angiogram of the chest was done which showed negative for PE but bilateral airspace disease consistent with multifocal pneumonia..  Initial labs were notable for WBC at 4.2 with 2% bands,on elranatamab .  COVID, flu, RSV negative.  Procalcitonin was negative.  Patient had elevated lactate on presentation with trended up to 5.3.  Received 3-1/2 L of IV fluids in the ED.  Continue vancomycin  cefepime  and Flagyl .  Given acute respiratory failure patient also required BiPAP due to increased work of breathing and was admitted t for further evaluation and treatment.  Assessment and Plan: Severe sepsis likely secondary to multifocal community-acquired pneumonia. Increased risk for multidrug-resistant organism given immunosuppression.  Patient had tachycardia neutropenia lactic acidosis and pulmonary infiltrates suggestive of sepsis secondary to multifocal pneumonia with fever and hypoxia requiring supplemental oxygen .  Continue vancomycin  cefepime  and Flagyl .  PCCM was consulted and added Zithromax  for 3 days on 915.  ID was consulted on 916 after oncology discussed case at the- ID consulted 9/16 after oncology discussed case at the committee.  Patient was noncompliant with atovaquone  at home due to taste (takes every couple days)   Acute hypoxic respiratory failure secondary to severe multifocal pneumonia.  PE was ruled  out.  Was initially on high flow nasal cannula BiPAP.  Continue Solu-Medrol .  PCCM on board.  2D echocardiogram was performed on 03/11/2024 with LV ejection fraction of 35 to 40% with no regional wall motion abnormality and grade 1 diastolic dysfunction.  Systolic cardiomyopathy.  New findings of LV ejection fraction of 35 to 40%.  Cardiology has been consulted.    Multiple myeloma in remission  Follows up with Dr. Federico oncology as outpatient. - 2016: Underwent autologous stem cell transplant. 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 10/16/2022: Completed CAR-T therapy with [employer]ec[employer]. 09/2023: Started on Elrexfio  due to progression of disease. 01/02/2024: Last visit at [facility] where induction therapy was completed - Now on Elranatamab  (Elrexfio ); just had cycle 6 on 02/27/24 - Continue atovaquone  and acyclovir  Previous provider had spoken with Dr. Federico on 03/11/2024; patient due for IVIG on 03/12/2024.  Okay to wait until outpatient follow-up for infusion   Anemia due to antineoplastic chemotherapy Hemoglobin today at 8.9.  No evidence of bleeding.  Will continue to monitor.   Essential hypertension Oral antihypertensives on hold.  As needed antihypertensives at this time.   Diarrhea. History of chronic diarrhea.  Was recently on ciprofloxacin .  As needed Imodium  Lomotil . febrile -Okay to rotate with Imodium  and Lomotil    GERD (gastroesophageal reflux disease) - Continue Protonix 

## 2024-03-08 NOTE — ED Triage Notes (Signed)
 Pt Bib ems for tachycardia. She is currently receiving chemo. Was seen last week for fevers at outpatient office. 89% on RA during triage.

## 2024-03-08 NOTE — Assessment & Plan Note (Signed)
-   Soft blood pressure initially in the ER but has up trended -Hold home meds for now and can use as needed meds

## 2024-03-08 NOTE — Progress Notes (Signed)
 Pharmacy Antibiotic Note  Jean Davidson is a 78 y.o. female admitted on 03/08/2024 with sepsis. Pharmacy has been consulted for cefepime  and vancomycin  dosing.  Patient received one-time doses of cefepime , Flagyl , and vanc while in the ED this afternoon.   Plan: - Start cefepime  2g IV q12h  - Start vancomycin  1000mg  IV q24h (eAUC 444 using Scr 0.8, Vd 0.72) - Vanc levels PRN - Monitor renal function, cultures, and overall clinical picture - De-escalate abx as able   Height: 5' 2 (157.5 cm) Weight: 72.6 kg (160 lb) IBW/kg (Calculated) : 50.1  Temp (24hrs), Avg:98.4 F (36.9 C), Min:98.4 F (36.9 C), Max:98.4 F (36.9 C)  Recent Labs  Lab 03/05/24 0757 03/08/24 1138 03/08/24 1216 03/08/24 1217 03/08/24 1424  WBC 3.8* 4.2  --   --   --   CREATININE 0.71 0.78 0.80  --   --   LATICACIDVEN  --   --   --  2.0* 5.3*    Estimated Creatinine Clearance: 54.9 mL/min (by C-G formula based on SCr of 0.8 mg/dL).    No Known Allergies  Antimicrobials this admission: 9/13 Flagyl  >> 9/13 cefepime  >>  9/13 vanc >>   Dose adjustments this admission: N/A  Microbiology results: 9/10 BCx: NG x3 days 9/10 UCx: multiple species  9/13 resp panel: neg 9/13 RVP: ip 9/13 Bcx: ip 9/13 GIP: not yet collected 9/13 C diff panel: not yet collected   Thank you for allowing pharmacy to be a part of this patient's care.  Lacinda Moats, PharmD Clinical Pharmacist  9/13/20253:34 PM

## 2024-03-08 NOTE — ED Notes (Signed)
 US  PIV placed, 2nd set of cultures sent to lab

## 2024-03-08 NOTE — Assessment & Plan Note (Signed)
-

## 2024-03-08 NOTE — ED Provider Notes (Signed)
 [facility] EMERGENCY DEPARTMENT AT [facility] Provider Note   CSN: [id number] Arrival date & time: 03/08/24  1059     Patient presents with: Tachycardia (Pt Bib ems for tachycardia. She is currently receiving chemo. Was seen last week for fevers at outpatient office. 89% on RA during triage. )   Jean Davidson is a 78 y.o. female.   78 year old female with history of multiple myeloma who last received chemotherapy 1 week ago presents with increased heart rate chills myalgias.  Temperature up to 100.4 at home.  Seen in the cancer clinic this week for temp of 100.6 had urinalysis that was consistent with infection and placed on ciprofloxacin .  Patient has been on that medication since.  Today she noted that she was more short of breath with cough and some URI symptoms.  Patient has a home Fitbit which showed her heart rate up into the 150s.  She has had chronic diarrhea which is unchanged.  Of note, patient did have a negative COVID and flu test 2 days ago in the clinic.  Denies any flank pain denies any abdominal discomfort.  Called EMS and patient's pulse oximetry was 89% on room air.  She is not normally on oxygen .  Was placed on 2 L of oxygen  and sats improved to 96%.       Prior to Admission medications   Medication Sig Start Date End Date Taking? Authorizing Provider  acyclovir  (ZOVIRAX ) 400 MG tablet Take 1 tablet (400 mg total) by mouth 2 (two) times daily. 08/07/23   Lonn Hicks, MD  aspirin  EC 81 MG tablet Take 81 mg by mouth daily. Swallow whole.    [provider]  atovaquone  (MEPRON ) 750 MG/5ML suspension TAKE 10 MLS (1,500 MG TOTAL) BY MOUTH 2 (TWO) TIMES DAILY. 03/04/24   Dorsey, John T IV, MD  cholecalciferol  (VITAMIN D3) 25 MCG (1000 UNIT) tablet Take 4,000 Units by mouth daily. Instructed to increase to 4,000 units daily    [provider]  ciprofloxacin  (CIPRO ) 250 MG tablet Take 3 tablets (750 mg total) by mouth 2 (two) times daily for 7 days.  03/05/24 03/12/24  Walisiewicz, Kaitlyn E, PA-C  metoprolol  tartrate (LOPRESSOR ) 25 MG tablet TAKE 1 & 1/2 TABLET BY MOUTH TWICE A DAY--due for follow up appointment. 08/01/23   Furth, Cadence H, PA-C  mirtazapine  (REMERON ) 15 MG tablet TAKE 1 TABLET BY MOUTH EVERYDAY AT BEDTIME 01/28/24   Dorsey, John T IV, MD  Multiple Vitamins-Minerals (PRESERVISION AREDS 2 PO) Take 1 tablet by mouth 2 (two) times daily.    [provider]  ondansetron  (ZOFRAN ) 8 MG tablet Take 1 tablet (8 mg total) by mouth every 8 (eight) hours as needed (Nausea or vomiting). Patient taking differently: Take 8 mg by mouth 2 (two) times daily. 09/26/22   Lonn Hicks, MD  pantoprazole  (PROTONIX ) 40 MG tablet TAKE 1 TABLET BY MOUTH 2 TIMES DAILY. TAKE 30-60 MINUTES BEFORE BREAKFAST AND DINNER. 03/06/24   Avram Lupita BRAVO, MD  prochlorperazine  (COMPAZINE ) 5 MG tablet Take 5 mg by mouth every 6 (six) hours as needed for nausea or vomiting.    [provider]  senna-docusate (SENOKOT-S) 8.6-50 MG tablet Take 2 tablets by mouth daily as needed. 10/23/22   [provider]    Allergies: Patient has no known allergies.    Review of Systems  All other systems reviewed and are negative.   Updated Vital Signs BP 109/67 (BP Location: Right Arm)   Pulse (!) 101  Temp 98.4 F (36.9 C) (Oral)   Resp 18   Ht 1.575 m (5' 2)   Wt 72.6 kg   SpO2 96%   BMI 29.26 kg/m   Physical Exam Vitals and nursing note reviewed.  Constitutional:      General: She is not in acute distress.    Appearance: Normal appearance. She is well-developed. She is not toxic-appearing.  HENT:     Head: Normocephalic and atraumatic.  Eyes:     General: Lids are normal.     Conjunctiva/sclera: Conjunctivae normal.     Pupils: Pupils are equal, round, and reactive to light.  Neck:     Thyroid : No thyroid  mass.     Trachea: No tracheal deviation.  Cardiovascular:     Rate and Rhythm: Normal rate and regular rhythm.     Heart sounds:  Normal heart sounds. No murmur heard.    No gallop.  Pulmonary:     Effort: Pulmonary effort is normal. No respiratory distress.     Breath sounds: Normal breath sounds. No stridor. No decreased breath sounds, wheezing, rhonchi or rales.  Abdominal:     General: There is no distension.     Palpations: Abdomen is soft.     Tenderness: There is no abdominal tenderness. There is no rebound.  Musculoskeletal:        General: No tenderness. Normal range of motion.     Cervical back: Normal range of motion and neck supple.  Skin:    General: Skin is warm and dry.     Findings: No abrasion or rash.  Neurological:     Mental Status: She is alert and oriented to person, place, and time. Mental status is at baseline.     GCS: GCS eye subscore is 4. GCS verbal subscore is 5. GCS motor subscore is 6.     Cranial Nerves: No cranial nerve deficit.     Sensory: No sensory deficit.     Motor: Motor function is intact.  Psychiatric:        Attention and Perception: Attention normal.        Speech: Speech normal.        Behavior: Behavior normal.     (all labs ordered are listed, but only abnormal results are displayed) Labs Reviewed  RESP PANEL BY RT-PCR (RSV, FLU A&B, COVID)  [id number]  CULTURE, BLOOD (ROUTINE X 2)  CULTURE, BLOOD (ROUTINE X 2)  RESPIRATORY PANEL BY PCR  COMPREHENSIVE METABOLIC PANEL WITH GFR  CBC WITH DIFFERENTIAL/PLATELET  PROTIME-INR  URINALYSIS, W/ REFLEX TO CULTURE (INFECTION SUSPECTED)  I-STAT CG4 LACTIC ACID, ED  I-STAT CHEM 8, ED    EKG: None  Radiology: No results found.   Procedures   Medications Ordered in the ED  lactated ringers  infusion (has no administration in time range)  lactated ringers  bolus 1,000 mL (has no administration in time range)    And  lactated ringers  bolus 1,000 mL (has no administration in time range)    And  lactated ringers  bolus 250 mL (has no administration in time range)  ceFEPIme  (MAXIPIME ) 2 g in sodium chloride  0.9 % 100  mL IVPB (has no administration in time range)  metroNIDAZOLE  (FLAGYL ) IVPB 500 mg (has no administration in time range)  vancomycin  (VANCOCIN ) IVPB 1000 mg/200 mL premix (has no administration in time range)  Medical Decision Making EKG shows sinus tachycardia. Code sepsis initiated prior to my seeing the patient.  Patients lactic acid is 2.  COVID and flu test negative.  Chest x-ray shows no acute findings.  Patient with persistent temperatures at home.  Urinalysis is pending at this time.  Consult hospitalist for admission     Final diagnoses:  None    ED Discharge Orders     None          Dasie Faden, MD 03/08/24 1324

## 2024-03-08 NOTE — ED Notes (Signed)
 Blood work and one set of cultures collected. we had IV difficulty. We were able to obtain access in the left hand but she will need IV US  for the second IV access. Antibiotics have been started on the L hand access. Charge Notified

## 2024-03-08 NOTE — ED Provider Triage Note (Signed)
 Emergency Medicine Provider Triage Evaluation Note  NYHLA MOUNTJOY , a 78 y.o. female  was evaluated in triage.  Pt complains of fever for the past few days.  Patient is immunocompromise currently undergoing treatment for multiple myeloma.  She was seen in oncology with a temperature of 100.6.  They did blood cultures done and she was put on Cipro  prophylactically for bacterial causes.  She reports that she woke up today feeling short of breath and noticed that her Fitbit said that her heart rate was in the 150s.  Tmax was 100.6.  She is feeling mildly short of breath and occasional nonproductive cough.  Denies any dysuria.  Reports she has been having some generalized overall fatigue.  Called EMS today.  Patient was 89% on arrival to the triage room.  Was placed on 2 L promptly and now was at 96%.  She speaking in full sentences..  Review of Systems  Positive:  Negative:   Physical Exam  BP 109/67 (BP Location: Right Arm)   Pulse (!) 101   Temp 98.4 F (36.9 C) (Oral)   Resp 18   Ht 5' 2 (1.575 m)   Wt 72.6 kg   SpO2 96%   BMI 29.26 kg/m  Gen:   Awake, no distress   Resp:  Normal effort  MSK:   Moves extremities without difficulty  Other:  Lungs clear to auscultation  Medical Decision Making  Medically screening exam initiated at 11:20 AM.  Appropriate orders placed.  AUDIE WIESER was informed that the remainder of the evaluation will be completed by another provider, this initial triage assessment does not replace that evaluation, and the importance of remaining in the ED until their evaluation is complete.  Patient initially found hypoxic, put on 2 L and is now at normal saturation.  Given her tachycardia as well as reported temperatures at home, I have ordered ED sepsis protocol with broad-spectrum antibiotics given that she was neutropenic previously.   Bernis Ernst, PA-C 03/08/24 1121

## 2024-03-08 NOTE — Sepsis Progress Note (Addendum)
 Elink monitoring for the code sepsis protocol.   1450: Notified provider of need to order 3rd lactic acid.   1635: Notified bedside nurse of need to draw 3rd lactic acid.

## 2024-03-08 NOTE — Assessment & Plan Note (Addendum)
-  follows with Dr. Federico outpatient - 2016: Underwent autologous stem cell transplant. 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 10/16/2022: Completed CAR-T therapy with [employer]. 09/2023: Started on Elrexfio  due to progression of disease. 01/02/2024: Last visit at [facility] where induction therapy was completed - Now on Elranatamab  (Elrexfio ); just had cycle 6 on 02/27/24 - Continue atovaquone  (see above) and acyclovir  - Discussed case again with Dr. Federico on 03/11/2024; patient due for IVIG on 03/12/2024.  Okay to wait until outpatient follow-up for infusion

## 2024-03-09 ENCOUNTER — Other Ambulatory Visit: Payer: Self-pay

## 2024-03-09 ENCOUNTER — Inpatient Hospital Stay (HOSPITAL_COMMUNITY)

## 2024-03-09 ENCOUNTER — Encounter (HOSPITAL_COMMUNITY): Payer: Self-pay | Admitting: Internal Medicine

## 2024-03-09 DIAGNOSIS — J9601 Acute respiratory failure with hypoxia: Secondary | ICD-10-CM | POA: Diagnosis not present

## 2024-03-09 DIAGNOSIS — R6520 Severe sepsis without septic shock: Secondary | ICD-10-CM | POA: Diagnosis not present

## 2024-03-09 DIAGNOSIS — J189 Pneumonia, unspecified organism: Secondary | ICD-10-CM | POA: Diagnosis not present

## 2024-03-09 DIAGNOSIS — A419 Sepsis, unspecified organism: Secondary | ICD-10-CM | POA: Diagnosis not present

## 2024-03-09 LAB — BASIC METABOLIC PANEL WITH GFR
Anion gap: 14 (ref 5–15)
BUN: 7 mg/dL — ABNORMAL LOW (ref 8–23)
CO2: 22 mmol/L (ref 22–32)
Calcium: 8.4 mg/dL — ABNORMAL LOW (ref 8.9–10.3)
Chloride: 104 mmol/L (ref 98–111)
Creatinine, Ser: 0.6 mg/dL (ref 0.44–1.00)
GFR, Estimated: >60 mL/min (ref >60)
Glucose, Bld: 115 mg/dL — ABNORMAL HIGH (ref 70–99)
Potassium: 3.9 mmol/L (ref 3.5–5.1)
Sodium: 140 mmol/L (ref 135–145)

## 2024-03-09 LAB — CBC WITH DIFFERENTIAL/PLATELET
Abs Immature Granulocytes: 0.07 K/uL (ref 0.00–0.07)
Basophils Absolute: 0 K/uL (ref 0.0–0.1)
Basophils Relative: 0 %
Eosinophils Absolute: 0 K/uL (ref 0.0–0.5)
Eosinophils Relative: 0 %
HCT: 31.9 % — ABNORMAL LOW (ref 36.0–46.0)
Hemoglobin: 9.6 g/dL — ABNORMAL LOW (ref 12.0–15.0)
Immature Granulocytes: 1 %
Lymphocytes Relative: 27 %
Lymphs Abs: 1.4 K/uL (ref 0.7–4.0)
MCH: 31.7 pg (ref 26.0–34.0)
MCHC: 30.1 g/dL (ref 30.0–36.0)
MCV: 105.3 fL — ABNORMAL HIGH (ref 80.0–100.0)
Monocytes Absolute: 0.7 K/uL (ref 0.1–1.0)
Monocytes Relative: 13 %
Neutro Abs: 3.2 K/uL (ref 1.7–7.7)
Neutrophils Relative %: 59 %
Platelets: 105 K/uL — ABNORMAL LOW (ref 150–400)
RBC: 3.03 MIL/uL — ABNORMAL LOW (ref 3.87–5.11)
RDW: 14.2 % (ref 11.5–15.5)
WBC: 5.4 K/uL (ref 4.0–10.5)
nRBC: 0 % (ref 0.0–0.2)

## 2024-03-09 LAB — MAGNESIUM: Magnesium: 1.5 mg/dL — ABNORMAL LOW (ref 1.7–2.4)

## 2024-03-09 LAB — PROCALCITONIN: Procalcitonin: <0.1 ng/mL

## 2024-03-09 LAB — LACTIC ACID, PLASMA: Lactic Acid, Venous: 2.2 mmol/L (ref 0.5–1.9)

## 2024-03-09 MED ORDER — CHLORHEXIDINE GLUCONATE CLOTH 2 % EX PADS
6.0000 | MEDICATED_PAD | Freq: Every day | CUTANEOUS | Status: DC
  Administered 2024-03-09 – 2024-03-20 (×11): 6 via TOPICAL

## 2024-03-09 MED ORDER — METHYLPREDNISOLONE SODIUM SUCC 40 MG IJ SOLR
40.0000 mg | Freq: Two times a day (BID) | INTRAMUSCULAR | Status: DC
  Administered 2024-03-09 (×2): 40 mg via INTRAVENOUS
  Filled 2024-03-09 (×2): qty 1

## 2024-03-09 MED ORDER — MORPHINE SULFATE (PF) 2 MG/ML IV SOLN
2.0000 mg | INTRAVENOUS | Status: DC | PRN
  Administered 2024-03-09: 2 mg via INTRAVENOUS
  Filled 2024-03-09: qty 1

## 2024-03-09 MED ORDER — ORAL CARE MOUTH RINSE: 15.0000 mL | OROMUCOSAL | Status: DC | PRN

## 2024-03-09 MED ORDER — MAGNESIUM SULFATE 2 GM/50ML IV SOLN
2.0000 g | Freq: Once | INTRAVENOUS | Status: AC
  Administered 2024-03-09: 2 g via INTRAVENOUS
  Filled 2024-03-09: qty 50

## 2024-03-09 NOTE — Progress Notes (Signed)
   03/08/24 2327  BiPAP/CPAP/SIPAP  $ Non-Invasive Ventilator  Non-Invasive Vent Initial  $ Face Mask Medium Yes  BiPAP/CPAP/SIPAP Pt Type Adult  BiPAP/CPAP/SIPAP SERVO  Mask Type Full face mask  Dentures removed? Not applicable  Mask Size Medium  IPAP 14 cmH20  EPAP 6 cmH2O  Pressure Support 8 cmH20  PEEP 6 cmH20  FiO2 (%) 100 %  Minute Ventilation 14.4  Leak 45  Peak Inspiratory Pressure (PIP) 14  Tidal Volume (Vt) 496  Patient Home Machine No  Patient Home Mask No  Patient Home Tubing No  Auto Titrate No  Press High Alarm 25 cmH2O  Press Low Alarm 3 cmH2O  Device Plugged into RED Power Outlet Yes  BiPAP/CPAP /SiPAP Vitals  Pulse Rate (!) 132  Resp (!) 31  SpO2 93 %  MEWS Score/Color  MEWS Score 5  MEWS Score Color Red

## 2024-03-09 NOTE — Progress Notes (Signed)
 Progress Note    Jean Davidson   FMW:[id number]  DOB: 24-Jul-1945  DOA: 03/08/2024     1 PCP: Patient, [name]  Initial CC: Shortness of breath  Hospital Course: Jean Davidson is a 78 year old female with PMH Relapsed/Refractory IgA Lambda Multiple Myeloma, In Remission, GERD, HTN, macular degeneration, cervical spinal stenosis who presented with dyspnea. She had recently been seen outpatient with low-grade fevers and a urinalysis was checked and she was placed on ciprofloxacin  for presumed UTI however she was having no urinary symptoms. Due to worsening dyspnea, nonproductive cough and persistent low-grade fevers less than 101 F, she presented for further evaluation. She was initially afebrile on workup with mild tachycardia and worsening hypoxia. She ultimately required escalation of oxygen  up to high flow. Initial CXR was clear and unremarkable. Due to worsening oxygen  demands, CT angio chest was also ordered. WBC 4.2 with 2% bands, but also on elranatamab .  COVID, flu, RSV negative. PCT negative. Lactic acid initially 2 which further up trended to 5.3. She received 3.25 L IVF bolus in the ER Abx started: Vanc, cefepime , flagyl   Given acute respiratory failure despite CXR she was admitted for sepsis treatment with presumed pulmonary source.   CT angio chest was able to finally be obtained after admission and negative for PE but showed extensive bilateral airspace disease consistent with multifocal pneumonia. She even required escalation of oxygen  up to high flow nasal cannula supplemented with BiPAP at times due to work of breathing.  Interval History:  Escalated to high flow nasal cannula overnight.  Also required BiPAP use for work of breathing. This morning comfortable on BiPAP but still labored a little bit. She was able to come off this afternoon for a break and hopefully to eat some.  Assessment and Plan: * Severe sepsis (HCC) - Tachycardia, neutropenia recently, lactic  acidosis; suspected pulm source - CT angio chest confirmed multifocal severe pneumonia -Continue broad-spectrum antibiotics  Acute hypoxic respiratory failure (HCC) - Etiology confirmed after CTA chest as severe multifocal pneumonia -PE ruled out on CTA chest - continue O2 as necessary; unable to intubate given code status so trying to support aggressively otherwise - HFNC, wean as able - okay with BiPAP for WOB at times; rest breaks to eat, etc  CAP (community acquired pneumonia) - Increased risk for MDRO given immunosuppression -CTA chest negative for PE, showing multifocal severe pneumonia -Started on vancomycin , cefepime , Flagyl  on admission, continue -Due to severe hypoxia, adding steroids -Pulmonology consulted for recommendations.  Oncology also made aware of hospitalization (no extra recommendations at this time)  Multiple myeloma in remission [facility]) -follows with Dr. Federico outpatient - 2016: Underwent autologous stem cell transplant. 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 10/16/2022: Completed CAR-T therapy with [employer]. 09/2023: Started on Elrexfio  due to progression of disease. 01/02/2024: Last visit at [facility] where induction therapy was completed - Now on Elranatamab  (Elrexfio ); just had cycle 6 on 02/27/24 - Continue atovaquone  and acyclovir   Anemia due to antineoplastic chemotherapy - Stable hemoglobin 11 to 12 g/dL  Essential hypertension - Soft blood pressure initially in the ER but has up trended -Hold home meds for now and can use as needed meds  Diarrhea - History of chronic diarrhea however given concern for overall infection, prudent to rule out C. Difficile; check GI panel as well.  No abdominal pain noted - Also recently on ciprofloxacin  for a couple days after concern for outpatient UTI.  Urine culture from 03/05/2024 negative for growth (multiple species  present)--- blood cultures at that time also negative to date   GERD  (gastroesophageal reflux disease) - Continue Protonix    Old records reviewed in assessment of this patient  Antimicrobials: Vancomycin  03/08/2024 >> current Cefepime  03/08/2024 >> current Flagyl  03/08/2024 >> current  DVT prophylaxis:  SCDs Start: 03/08/24 1347   Code Status:   Code Status: Limited: Do not attempt resuscitation (DNR) -DNR-LIMITED -Do Not Intubate/DNI   Mobility Assessment (Last 72 Hours)     Mobility Assessment     Row Name 03/09/24 0730 03/08/24 2000 03/08/24 1843       Does the patient have exclusion criteria? No - Perform mobility assessment No - Perform mobility assessment No - Perform mobility assessment     What is the highest level of mobility based on the mobility assessment? Level 3 (Stands with assistance) - Balance while standing  and cannot march in place Level 1 (Bedfast) - Unable to balance while sitting on edge of bed Level 5 (Ambulates independently) - Balance while walking independently - Complete     Is the above level different from baseline mobility prior to current illness? Yes - Recommend PT order Yes - Recommend PT order --        Barriers to discharge: None Disposition Plan: Home HH orders placed: N/A Status is: Inpatient  Objective: Blood pressure 118/65, pulse (!) 111, temperature 99.1 F (37.3 C), temperature source Axillary, resp. rate (!) 22, height 5' 2 (1.575 m), weight 72.6 kg, SpO2 96%.  Examination:  Physical Exam Constitutional:      General: She is not in acute distress.    Appearance: Normal appearance.  HENT:     Head: Normocephalic and atraumatic.     Mouth/Throat:     Mouth: Mucous membranes are moist.  Eyes:     Extraocular Movements: Extraocular movements intact.  Cardiovascular:     Rate and Rhythm: Normal rate and regular rhythm.  Pulmonary:     Effort: Pulmonary effort is normal. No respiratory distress.     Breath sounds: No wheezing.     Comments: Diffuse coarse breath sounds bilaterally Abdominal:      General: Bowel sounds are normal. There is no distension.     Palpations: Abdomen is soft.     Tenderness: There is no abdominal tenderness.  Musculoskeletal:        General: Normal range of motion.     Cervical back: Normal range of motion and neck supple.  Skin:    General: Skin is warm and dry.  Neurological:     General: No focal deficit present.     Mental Status: She is alert.  Psychiatric:        Mood and Affect: Mood normal.      Consultants:  Pulmonology - 9/14  Procedures:    Data Reviewed: Results for orders placed or performed during the hospital encounter of 03/08/24 (from the past 24 hours)  Procalcitonin     Status: None   Collection Time: 03/08/24  2:07 PM  Result Value Ref Range   Procalcitonin <0.10 ng/mL  I-Stat Lactic Acid, ED     Status: Abnormal   Collection Time: 03/08/24  2:24 PM  Result Value Ref Range   Lactic Acid, Venous 5.3 (HH) 0.5 - 1.9 mmol/L   Comment NOTIFIED PHYSICIAN   Urinalysis, w/ Reflex to Culture (Infection Suspected) -Urine, Clean Catch     Status: Abnormal   Collection Time: 03/08/24  3:00 PM  Result Value Ref Range   Specimen Source  URINE, CLEAN CATCH    Color, Urine YELLOW YELLOW   APPearance CLEAR CLEAR   Specific Gravity, Urine 1.018 1.005 - 1.030   pH 5.0 5.0 - 8.0   Glucose, UA NEGATIVE NEGATIVE mg/dL   Hgb urine dipstick NEGATIVE NEGATIVE   Bilirubin Urine NEGATIVE NEGATIVE   Ketones, ur 5 (A) NEGATIVE mg/dL   Protein, ur NEGATIVE NEGATIVE mg/dL   Nitrite NEGATIVE NEGATIVE   Leukocytes,Ua NEGATIVE NEGATIVE   RBC / HPF 11-20 0 - 5 RBC/hpf   WBC, UA 0-5 0 - 5 WBC/hpf   Bacteria, UA RARE (A) NONE SEEN   Squamous Epithelial / HPF 0-5 0 - 5 /HPF   Mucus PRESENT   I-Stat CG4 Lactic Acid, ED     Status: None   Collection Time: 03/08/24  5:28 PM  Result Value Ref Range   Lactic Acid, Venous 1.8 0.5 - 1.9 mmol/L  Lactic acid, plasma     Status: Abnormal   Collection Time: 03/08/24  6:55 PM  Result Value Ref Range    Lactic Acid, Venous 3.0 (HH) 0.5 - 1.9 mmol/L  D-dimer, quantitative     Status: Abnormal   Collection Time: 03/08/24  6:55 PM  Result Value Ref Range   D-Dimer, Quant 1.99 (H) 0.00 - 0.50 ug/mL-FEU  Basic metabolic panel     Status: Abnormal   Collection Time: 03/09/24  5:23 AM  Result Value Ref Range   Sodium 140 135 - 145 mmol/L   Potassium 3.9 3.5 - 5.1 mmol/L   Chloride 104 98 - 111 mmol/L   CO2 22 22 - 32 mmol/L   Glucose, Bld 115 (H) 70 - 99 mg/dL   BUN 7 (L) 8 - 23 mg/dL   Creatinine, Ser 9.39 0.44 - 1.00 mg/dL   Calcium  8.4 (L) 8.9 - 10.3 mg/dL   GFR, Estimated >39 >39 mL/min   Anion gap 14 5 - 15  CBC with Differential/Platelet     Status: Abnormal   Collection Time: 03/09/24  5:23 AM  Result Value Ref Range   WBC 5.4 4.0 - 10.5 K/uL   RBC 3.03 (L) 3.87 - 5.11 MIL/uL   Hemoglobin 9.6 (L) 12.0 - 15.0 g/dL   HCT 68.0 (L) 63.9 - 53.9 %   MCV 105.3 (H) 80.0 - 100.0 fL   MCH 31.7 26.0 - 34.0 pg   MCHC 30.1 30.0 - 36.0 g/dL   RDW 85.7 88.4 - 84.4 %   Platelets 105 (L) 150 - 400 K/uL   nRBC 0.0 0.0 - 0.2 %   Neutrophils Relative % 59 %   Neutro Abs 3.2 1.7 - 7.7 K/uL   Lymphocytes Relative 27 %   Lymphs Abs 1.4 0.7 - 4.0 K/uL   Monocytes Relative 13 %   Monocytes Absolute 0.7 0.1 - 1.0 K/uL   Eosinophils Relative 0 %   Eosinophils Absolute 0.0 0.0 - 0.5 K/uL   Basophils Relative 0 %   Basophils Absolute 0.0 0.0 - 0.1 K/uL   Immature Granulocytes 1 %   Abs Immature Granulocytes 0.07 0.00 - 0.07 K/uL  Magnesium      Status: Abnormal   Collection Time: 03/09/24  5:23 AM  Result Value Ref Range   Magnesium  1.5 (L) 1.7 - 2.4 mg/dL  Procalcitonin     Status: None   Collection Time: 03/09/24  5:23 AM  Result Value Ref Range   Procalcitonin <0.10 ng/mL  Lactic acid, plasma     Status: Abnormal   Collection Time: 03/09/24  6:20 AM  Result Value Ref Range   Lactic Acid, Venous 2.2 (HH) 0.5 - 1.9 mmol/L   *Note: Due to a large number of results and/or encounters for the  requested time period, some results have not been displayed. A complete set of results can be found in Results Review.    I have reviewed pertinent nursing notes, vitals, labs, and images as necessary. I have ordered labwork to follow up on as indicated.  I have reviewed the last notes from staff over past 24 hours. I have discussed patient's care plan and test results with nursing staff, CM/SW, and other staff as appropriate.  Time spent: Greater than 50% of the 55 minute visit was spent in counseling/coordination of care for the patient as laid out in the A&P.   LOS: 1 day   Alm Apo, MD [facility] 03/09/2024, 2:04 PM

## 2024-03-09 NOTE — Progress Notes (Signed)
 Attempted to take pt off bipap for a trial break.  Pt was placed on 12L NRB mask.  HR 120, RR 28, sats 96%.  PT tolerated for a couple of minutes but stated she felt more SOB off the bipap.  PT requested to go back on bipap.  Pt placed back on bipap at previous settings. FIO2 decreased to 60% from 75% since sats were 98%.  PT is tolerating well.  RN updated.

## 2024-03-09 NOTE — Progress Notes (Signed)
 Patient transported to CT and back on Bipap. No acute events occurred.

## 2024-03-09 NOTE — Consult Note (Signed)
 NAME:  Jean Davidson, MRN:  [id number], DOB:  09/01/1945, LOS: 1 ADMISSION DATE:  03/08/2024, CONSULTATION DATE: 03/10/2023 REFERRING MD: JONETTA Apo MD, CHIEF COMPLAINT: Acute hypoxic respiratory failure  History of Present Illness:  78 year old with history of multiple myeloma, GERD, hypertension, macular degeneration presenting with sepsis, acute hypoxic respiratory failure secondary to multilobar pneumonia.  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   Significant Hospital Events: Including procedures, antibiotic start and stop dates in addition to other pertinent events     Interim History / Subjective:    Objective    Blood pressure (!) 144/89, pulse (!) 124, temperature 98.3 F (36.8 C), temperature source Oral, resp. rate (!) 28, height 5' 2 (1.575 m), weight 72.6 kg, SpO2 100%.    FiO2 (%):  [75 %-100 %] 75 % PEEP:  [6 cmH20] 6 cmH20 Pressure Support:  [8 cmH20] 8 cmH20   Intake/Output Summary (Last 24 hours) at 03/09/2024 0855 Last data filed at 03/09/2024 0616 Gross per 24 hour  Intake 2753.12 ml  Output --  Net 2753.12 ml   Filed Weights   03/08/24 1106  Weight: 72.6 kg    Examination: Blood pressure (!) 144/89, pulse (!) 124, temperature 98.3 F (36.8 C),  temperature source Oral, resp. rate (!) 28, height 5' 2 (1.575 m), weight 72.6 kg, SpO2 100%. Gen:     Elderly woman on bipap HEENT:  EOMI, sclera anicteric Neck:     No masses; no thyromegaly Lungs:    Clear to auscultation bilaterally; normal respiratory effort CV:         Regular rate and rhythm; no murmurs Abd:      + bowel sounds; soft, non-tender; no palpable masses, no distension Ext:    No edema; adequate peripheral perfusion Neuro: alert and oriented x 3 Psych: normal mood and affect   Labs/imaging reviewed Significant for hemoglobin 9.6, platelets 105 CTA with no pulmonary embolism seen with, extensive bilateral airspace disease, small bilateral effusions Lactic acid 2.2  Resolved problem list   Assessment and Plan  Acute hypoxic respiratory failure secondary to multilobar pneumonia Severe sepsis present on admission Negative COVID flu, RSV and respiratory virus panel Check urine Legionella, pneumococcus Continue BiPAP, wean down oxygen  as tolerated  Multiple myeloma in remission Chronic anemia Follows with oncology Monitor labs  Code status is DNR  Critical care time:    The patient is critically ill with multiple organ system failure and requires high complexity decision making for assessment and support, frequent evaluation and titration of therapies, advanced monitoring, review of radiographic studies and interpretation of complex data.   Critical Care Time devoted to patient care services, exclusive of separately billable procedures, described in this note is 35 minutes.   Jyssica Rief MD [facility] See Amion for pager  If no response to pager , please call [phone] until 7pm After 7:00 pm call Elink  [phone] 03/09/2024, 9:05 AM

## 2024-03-09 NOTE — Progress Notes (Signed)
 PT taken off bipap and placed on HHFNC per MD order.  PT is tolerating well with setting of 35L and 60% FIO2.  HR 115, RR 22, sats 96%. PT states that the cannula feels better.  RN updated.

## 2024-03-09 NOTE — Plan of Care (Signed)

## 2024-03-09 NOTE — Assessment & Plan Note (Addendum)
-   Increased risk for MDRO given immunosuppression -CTA chest negative for PE, showing multifocal severe pneumonia -Started on vancomycin , cefepime , Flagyl  on admission, continue - Azithromycin  x 3 days started 9/15 per pulmonology -Due to severe hypoxia, adding steroids -Pulmonology following - Patient also not compliant with atovaquone  at home prior to hospitalization only taking every couple days due to the taste; ID consulted 9/16 after oncology discussed case with bispecific committee; patient has been noncompliant with atovaquone  at home

## 2024-03-10 DIAGNOSIS — J9601 Acute respiratory failure with hypoxia: Secondary | ICD-10-CM | POA: Diagnosis not present

## 2024-03-10 DIAGNOSIS — C9001 Multiple myeloma in remission: Secondary | ICD-10-CM | POA: Diagnosis not present

## 2024-03-10 DIAGNOSIS — A419 Sepsis, unspecified organism: Secondary | ICD-10-CM | POA: Diagnosis not present

## 2024-03-10 DIAGNOSIS — R6520 Severe sepsis without septic shock: Secondary | ICD-10-CM | POA: Diagnosis not present

## 2024-03-10 DIAGNOSIS — J189 Pneumonia, unspecified organism: Secondary | ICD-10-CM | POA: Diagnosis not present

## 2024-03-10 LAB — BASIC METABOLIC PANEL WITH GFR
Anion gap: 10 (ref 5–15)
BUN: 12 mg/dL (ref 8–23)
CO2: 24 mmol/L (ref 22–32)
Calcium: 8.8 mg/dL — ABNORMAL LOW (ref 8.9–10.3)
Chloride: 106 mmol/L (ref 98–111)
Creatinine, Ser: 0.57 mg/dL (ref 0.44–1.00)
GFR, Estimated: >60 mL/min (ref >60)
Glucose, Bld: 178 mg/dL — ABNORMAL HIGH (ref 70–99)
Potassium: 3.8 mmol/L (ref 3.5–5.1)
Sodium: 139 mmol/L (ref 135–145)

## 2024-03-10 LAB — CBC WITH DIFFERENTIAL/PLATELET
Abs Immature Granulocytes: 0.14 K/uL — ABNORMAL HIGH (ref 0.00–0.07)
Basophils Absolute: 0 K/uL (ref 0.0–0.1)
Basophils Relative: 0 %
Eosinophils Absolute: 0 K/uL (ref 0.0–0.5)
Eosinophils Relative: 0 %
HCT: 27.5 % — ABNORMAL LOW (ref 36.0–46.0)
Hemoglobin: 8.7 g/dL — ABNORMAL LOW (ref 12.0–15.0)
Immature Granulocytes: 2 %
Lymphocytes Relative: 15 %
Lymphs Abs: 0.9 K/uL (ref 0.7–4.0)
MCH: 33.2 pg (ref 26.0–34.0)
MCHC: 31.6 g/dL (ref 30.0–36.0)
MCV: 105 fL — ABNORMAL HIGH (ref 80.0–100.0)
Monocytes Absolute: 0.2 K/uL (ref 0.1–1.0)
Monocytes Relative: 4 %
Neutro Abs: 4.9 K/uL (ref 1.7–7.7)
Neutrophils Relative %: 79 %
Platelets: 97 K/uL — ABNORMAL LOW (ref 150–400)
RBC: 2.62 MIL/uL — ABNORMAL LOW (ref 3.87–5.11)
RDW: 14.2 % (ref 11.5–15.5)
WBC: 6.2 K/uL (ref 4.0–10.5)
nRBC: 0 % (ref 0.0–0.2)

## 2024-03-10 LAB — CULTURE, BLOOD (ROUTINE X 2)
Culture: NO GROWTH
Culture: NO GROWTH

## 2024-03-10 LAB — MAGNESIUM: Magnesium: 2.3 mg/dL (ref 1.7–2.4)

## 2024-03-10 LAB — GLUCOSE, CAPILLARY
Glucose-Capillary: 177 mg/dL — ABNORMAL HIGH (ref 70–99)
Glucose-Capillary: 156 mg/dL — ABNORMAL HIGH (ref 70–99)
Glucose-Capillary: 203 mg/dL — ABNORMAL HIGH (ref 70–99)

## 2024-03-10 LAB — HEMOGLOBIN A1C
Hgb A1c MFr Bld: 4.7 % — ABNORMAL LOW (ref 4.8–5.6)
Mean Plasma Glucose: 88.19 mg/dL

## 2024-03-10 LAB — STREP PNEUMONIAE URINARY ANTIGEN: Strep Pneumo Urinary Antigen: NEGATIVE

## 2024-03-10 LAB — MRSA NEXT GEN BY PCR, NASAL: MRSA by PCR Next Gen: NOT DETECTED

## 2024-03-10 LAB — PRO BRAIN NATRIURETIC PEPTIDE: Pro Brain Natriuretic Peptide: 16972 pg/mL — ABNORMAL HIGH (ref <300.0)

## 2024-03-10 MED ORDER — FUROSEMIDE 10 MG/ML IJ SOLN
40.0000 mg | Freq: Once | INTRAMUSCULAR | Status: AC
  Administered 2024-03-10: 40 mg via INTRAVENOUS
  Filled 2024-03-10: qty 4

## 2024-03-10 MED ORDER — BIOTENE DRY MOUTH MT LIQD: 15.0000 mL | OROMUCOSAL | Status: DC | PRN

## 2024-03-10 MED ORDER — HYDROCORTISONE SOD SUC (PF) 100 MG IJ SOLR
100.0000 mg | Freq: Two times a day (BID) | INTRAMUSCULAR | Status: DC
Start: 2024-03-10 — End: 2024-03-14
  Administered 2024-03-10 – 2024-03-11 (×4): 100 mg via INTRAVENOUS
  Filled 2024-03-10 (×4): qty 2

## 2024-03-10 MED ORDER — FUROSEMIDE 10 MG/ML IJ SOLN
20.0000 mg | Freq: Once | INTRAMUSCULAR | Status: AC
  Administered 2024-03-10: 20 mg via INTRAVENOUS
  Filled 2024-03-10: qty 2

## 2024-03-10 MED ORDER — CALCIUM CARBONATE ANTACID 500 MG PO CHEW
2.0000 | CHEWABLE_TABLET | Freq: Three times a day (TID) | ORAL | Status: DC | PRN
  Administered 2024-03-10 – 2024-03-12 (×3): 400 mg via ORAL
  Filled 2024-03-10 (×3): qty 2

## 2024-03-10 MED ORDER — INSULIN ASPART 100 UNIT/ML IJ SOLN
0.0000 [IU] | Freq: Three times a day (TID) | INTRAMUSCULAR | Status: DC
  Administered 2024-03-10: 2 [IU] via SUBCUTANEOUS
  Administered 2024-03-10 (×2): 1 [IU] via SUBCUTANEOUS
  Administered 2024-03-11: 2 [IU] via SUBCUTANEOUS
  Administered 2024-03-11: 3 [IU] via SUBCUTANEOUS
  Administered 2024-03-12: 2 [IU] via SUBCUTANEOUS
  Administered 2024-03-12 – 2024-03-16 (×7): 1 [IU] via SUBCUTANEOUS
  Administered 2024-03-17: 2 [IU] via SUBCUTANEOUS
  Administered 2024-03-17 – 2024-03-19 (×5): 1 [IU] via SUBCUTANEOUS

## 2024-03-10 MED ORDER — LOPERAMIDE HCL 2 MG PO CAPS
2.0000 mg | ORAL_CAPSULE | ORAL | Status: DC | PRN
  Administered 2024-03-10 – 2024-03-17 (×9): 2 mg via ORAL
  Filled 2024-03-10 (×9): qty 1

## 2024-03-10 MED ORDER — SODIUM CHLORIDE 0.9 % IV SOLN
500.0000 mg | INTRAVENOUS | Status: AC
  Administered 2024-03-10 – 2024-03-12 (×3): 500 mg via INTRAVENOUS
  Filled 2024-03-10 (×3): qty 5

## 2024-03-10 NOTE — Progress Notes (Signed)
 NAME:  Jean Davidson, MRN:  [id number], DOB:  [birth year], LOS: 2 ADMISSION DATE:  03/08/2024, CONSULTATION DATE:  03/10/2024 REFERRING MD:  MELODIE, CHIEF COMPLAINT:  Respiratory Failure   History of Present Illness:  Jean Davidson is a 78 y/o F with relapsted/refractory multiple myeloma (in remission), GERD, HTN, and cervical stenosis who presents for acute hypoxic respiratory failure due to multifocal pneumonia. Pulmonology consulted for evaluation and management of the latter  Pertinent  Medical History  As above  Significant Hospital Events: Including procedures, antibiotic start and stop dates in addition to other pertinent events   Admitted on 03/08/2024, started on cefepime , vanc, metronidazole , needed BiPAP but currently on HFNC  Interim History / Subjective:  Patient notes improvement in dyspnea and cough. She notes lower extremity edema. She has no orthopnea.  Objective    Blood pressure (!) 122/57, pulse (!) 106, temperature 98.5 F (36.9 C), temperature source Oral, resp. rate 20, height 5' 2 (1.575 m), weight 72.6 kg, SpO2 100%.    FiO2 (%):  [50 %-60 %] 60 % Pressure Support:  [8 cmH20] 8 cmH20   Intake/Output Summary (Last 24 hours) at 03/10/2024 0932 Last data filed at 03/10/2024 0800 Gross per 24 hour  Intake 625.27 ml  Output 1000 ml  Net -374.73 ml   Filed Weights   03/08/24 1106  Weight: 72.6 kg    Examination: General: well appearing, slightly tachypneic but not in distress HENT: PERRL, no oral thrush Lungs: crackles diffusely Cardiovascular: RRR, no r/m/g Abdomen: soft, non-tender Extremities: 1+ pitting edema bilaterally Neuro: A&O x 3 GU: deferred  Resolved problem list   Assessment and Plan   #Acute Hypoxic Respiratory Failure: likely due to multifocal pneumonia. Meets severity criteria for steroids in the setting of severe CAP. CT chest reviewed and shows extensive bilateral multifocal consolidations with dense consolidation in lower lobe. RVP  negative. MRSA swab and sputum culture pending. - HFNC during day, wean as tolerated - BiPAP at night - Check MRSA swab, sputum cultures, urine strep/legionella - Agree with Vancomycin , Cefepime , Metronidazole  - Add Azithromycin  for 3 days - Started steroids for severe CAP with hydrocortisone  100 mg BID for 4 days - Gentle diuresis - given 1 dose of lasix  20 mg IVP x 1  Disposition: appropriate for step down unit  Labs   CBC: Recent Labs  Lab 03/05/24 0757 03/08/24 1138 03/08/24 1216 03/09/24 0523 03/10/24 0258  WBC 3.8* 4.2  --  5.4 6.2  NEUTROABS 2.2 1.8  --  3.2 4.9  HGB 11.8* 11.3* 11.2* 9.6* 8.7*  HCT 35.2* 36.0 33.0* 31.9* 27.5*  MCV 98.6 102.0*  --  105.3* 105.0*  PLT 103* 112*  --  105* 97*    Basic Metabolic Panel: Recent Labs  Lab 03/05/24 0757 03/08/24 1138 03/08/24 1216 03/09/24 0523 03/10/24 0258  NA 141 141 140 140 139  K 3.5 3.6 4.4 3.9 3.8  CL 106 104 105 104 106  CO2 27 22  --  22 24  GLUCOSE 111* 95 97 115* 178*  BUN 15 11 14  7* 12  CREATININE 0.71 0.78 0.80 0.60 0.57  CALCIUM  8.8* 9.4  --  8.4* 8.8*  MG 1.8  --   --  1.5* 2.3   GFR: Estimated Creatinine Clearance: 54.9 mL/min (by C-G formula based on SCr of 0.57 mg/dL). Recent Labs  Lab 03/05/24 0757 03/08/24 1138 03/08/24 1217 03/08/24 1407 03/08/24 1424 03/08/24 1728 03/08/24 1855 03/09/24 0523 03/09/24 0620 03/10/24 0258  PROCALCITON  --   --   --  <  0.10  --   --   --  <0.10  --   --   WBC 3.8* 4.2  --   --   --   --   --  5.4  --  6.2  LATICACIDVEN  --   --    < >  --  5.3* 1.8 3.0*  --  2.2*  --    < > = values in this interval not displayed.    Liver Function Tests: Recent Labs  Lab 03/05/24 0757 03/08/24 1138  AST 16 41  ALT 9 12  ALKPHOS 96 99  BILITOT 0.4 0.3  PROT 6.4* 6.4*  ALBUMIN 4.2 4.1   No results for input(s): LIPASE, AMYLASE in the last 168 hours. No results for input(s): AMMONIA in the last 168 hours.  ABG    Component Value Date/Time    TCO2 25 03/08/2024 1216     Coagulation Profile: Recent Labs  Lab 03/08/24 1340  INR 1.1    Cardiac Enzymes: No results for input(s): CKTOTAL, CKMB, CKMBINDEX, TROPONINI in the last 168 hours.  HbA1C: Hgb A1c MFr Bld  Date/Time Value Ref Range Status  08/08/2013 11:27 AM 5.2 4.6 - 6.5 % Final    Comment:    Glycemic Control Guidelines for People with Diabetes:Non Diabetic:  <6%Goal of Therapy: <7%Additional Action Suggested:  >8%     CBG: No results for input(s): GLUCAP in the last 168 hours.  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   Surgical History:   Past Surgical History:  Procedure Laterality Date   ANKLE SURGERY Left    BLADDER SUSPENSION  1988/1989   x2   COLONOSCOPY     ESOPHAGOGASTRODUODENOSCOPY     IR IMAGING GUIDED PORT INSERTION  03/01/2018   LIMBAL STEM CELL TRANSPLANT     x2   PORT-A-CATH REMOVAL     SHOULDER SURGERY Right 04/06/2013   right   UPPER GASTROINTESTINAL ENDOSCOPY       Social History:   reports that she quit smoking about 55 years ago. Her smoking use included cigarettes. She started smoking about 59 years ago. She has a 2 pack-year smoking history. She has never used smokeless tobacco. She reports current alcohol  use of about  1.0 standard drink of alcohol  per week. She reports that she does not use drugs.   Family History:  Her family history includes Colon cancer (age of onset: 61) in her paternal aunt; Diabetes in her father; Heart disease in her brother, father, and mother; Heart failure in her father; Hypertension in her brother; Kidney disease in her father; Skin cancer in her brother and father; Throat cancer in her father. There is no history of Stomach cancer, Esophageal cancer, or Rectal cancer.   Allergies No Known Allergies   Home Medications  Prior to Admission medications   Medication Sig Start Date End Date Taking? Authorizing Provider  acyclovir  (ZOVIRAX ) 400 MG tablet Take 1 tablet (400 mg total) by mouth 2 (two)  times daily. 08/07/23  Yes Gorsuch, Almarie, MD  aspirin  EC 81 MG tablet Take 81 mg by mouth daily. Swallow whole.   Yes [provider]  atovaquone  (MEPRON ) 750 MG/5ML suspension TAKE 10 MLS (1,500 MG TOTAL) BY MOUTH 2 (TWO) TIMES DAILY. 03/04/24  Yes Dorsey, John T IV, MD  cholecalciferol  (VITAMIN D3) 25 MCG (1000 UNIT) tablet Take 4,000 Units by mouth daily. Instructed to increase to 4,000 units daily   Yes [provider]  ciprofloxacin  (CIPRO ) 250 MG tablet Take 3 tablets (750 mg total) by mouth 2 (two) times daily for 7 days. 03/05/24 03/12/24 Yes Walisiewicz, Kaitlyn E, PA-C  metoprolol  tartrate (LOPRESSOR ) 25 MG tablet TAKE 1 & 1/2 TABLET BY MOUTH TWICE A DAY--due for follow up appointment. 08/01/23  Yes Furth, Cadence H, PA-C  mirtazapine  (REMERON ) 15 MG tablet TAKE 1 TABLET BY MOUTH EVERYDAY AT BEDTIME 01/28/24  Yes Dorsey, John T IV, MD  Multiple Vitamins-Minerals (PRESERVISION AREDS 2 PO) Take 1 tablet by mouth 2 (two) times daily.   Yes [provider]  ondansetron  (ZOFRAN ) 8 MG tablet Take 1 tablet (8 mg total) by mouth every 8 (eight) hours as needed (Nausea or vomiting). Patient taking differently: Take 8 mg by mouth 2 (two) times daily. 09/26/22  Yes Gorsuch, Ni, MD   pantoprazole  (PROTONIX ) 40 MG tablet TAKE 1 TABLET BY MOUTH 2 TIMES DAILY. TAKE 30-60 MINUTES BEFORE BREAKFAST AND DINNER. 03/06/24  Yes Avram Lupita BRAVO, MD  prochlorperazine  (COMPAZINE ) 5 MG tablet Take 5 mg by mouth every 6 (six) hours as needed for nausea or vomiting.   Yes [provider]  senna-docusate (SENOKOT-S) 8.6-50 MG tablet Take 2 tablets by mouth daily as needed. 10/23/22  Yes [provider]     Thank you for this interesting consult. I have spent 45 minutes evaluating patient, reviewing chart, and discussing plan of care with patient, family, and primary medical team. If you have any questions or concerns please reach out to me via pager ([phone]).  Paula Southerly, MD [facility]

## 2024-03-10 NOTE — Progress Notes (Signed)
 Progress Note    Jean Davidson   FMW:[id number]  DOB: 09-Jul-1945  DOA: 03/08/2024     2 PCP: Patient, [name]  Initial CC: Shortness of breath  Hospital Course: Jean Davidson is a 78 year old female with PMH Relapsed/Refractory IgA Lambda Multiple Myeloma, In Remission, GERD, HTN, macular degeneration, cervical spinal stenosis who presented with dyspnea. She had recently been seen outpatient with low-grade fevers and a urinalysis was checked and she was placed on ciprofloxacin  for presumed UTI however she was having no urinary symptoms. Due to worsening dyspnea, nonproductive cough and persistent low-grade fevers less than 101 F, she presented for further evaluation. She was initially afebrile on workup with mild tachycardia and worsening hypoxia. She ultimately required escalation of oxygen  up to high flow. Initial CXR was clear and unremarkable. Due to worsening oxygen  demands, CT angio chest was also ordered. WBC 4.2 with 2% bands, but also on elranatamab .  COVID, flu, RSV negative. PCT negative. Lactic acid initially 2 which further up trended to 5.3. She received 3.25 L IVF bolus in the ER Abx started: Vanc, cefepime , flagyl   Given acute respiratory failure despite CXR she was admitted for sepsis treatment with presumed pulmonary source.   CT angio chest was able to finally be obtained after admission and negative for PE but showed extensive bilateral airspace disease consistent with multifocal pneumonia. She even required escalation of oxygen  up to high flow nasal cannula supplemented with BiPAP at times due to work of breathing.  Interval History:  Much more comfortable appearing today.  High flow has also been weaned some since yesterday.  Has been able to eat as well.  Overall improving slowly.  Assessment and Plan: * Severe sepsis (HCC) - Tachycardia, neutropenia recently, lactic acidosis; suspected pulm source - CT angio chest confirmed multifocal severe  pneumonia -Continue broad-spectrum antibiotics (vanc, cefepime , flagyl ) - PCCM added azithro x 3 days on 9/15  Acute hypoxic respiratory failure (HCC) - Etiology confirmed after CTA chest as severe multifocal pneumonia -PE ruled out on CTA chest - continue O2 as necessary; unable to intubate given code status so trying to support aggressively otherwise - HFNC, wean as able - okay with BiPAP for WOB at times; pulmonology recommending BiPAP at night -Solu-Medrol  transitioned to Solu-Cortef  for 4 days on 03/10/2024  CAP (community acquired pneumonia) - Increased risk for MDRO given immunosuppression -CTA chest negative for PE, showing multifocal severe pneumonia -Started on vancomycin , cefepime , Flagyl  on admission, continue - Azithromycin  x 3 days started 9/15 per pulmonology -Due to severe hypoxia, adding steroids -Pulmonology consulted for recommendations.  Oncology also made aware of hospitalization (no extra recommendations at this time)  Multiple myeloma in remission [facility]) -follows with Dr. Federico outpatient - 2016: Underwent autologous stem cell transplant. 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 10/16/2022: Completed CAR-T therapy with [employer]. 09/2023: Started on Elrexfio  due to progression of disease. 01/02/2024: Last visit at [facility] where induction therapy was completed - Now on Elranatamab  (Elrexfio ); just had cycle 6 on 02/27/24 - Continue atovaquone  and acyclovir   Anemia due to antineoplastic chemotherapy - Stable hemoglobin 11 to 12 g/dL  Essential hypertension - Soft blood pressure initially in the ER but has up trended -Hold home meds for now and can use as needed meds  Diarrhea - History of chronic diarrhea - Stool studies ordered on admission but no significant diarrhea therefore canceled. No abdominal pain noted - Also recently on ciprofloxacin  for a couple days after concern for outpatient UTI.  Urine  culture from 03/05/2024 negative for growth  (multiple species present)--- blood cultures at that time also negative to date   GERD (gastroesophageal reflux disease) - Continue Protonix    Old records reviewed in assessment of this patient  Antimicrobials: Vancomycin  03/08/2024 >> current Cefepime  03/08/2024 >> current Flagyl  03/08/2024 >> current Azithromycin  03/10/2024 >> 03/12/2024  DVT prophylaxis:  SCDs Start: 03/08/24 1347   Code Status:   Code Status: Do not attempt resuscitation (DNR) PRE-ARREST INTERVENTIONS DESIRED  Mobility Assessment (Last 72 Hours)     Mobility Assessment     Row Name 03/10/24 0900 03/09/24 2000 03/09/24 0730 03/08/24 2000 03/08/24 1843   Does the patient have exclusion criteria? No - Perform mobility assessment No - Perform mobility assessment No - Perform mobility assessment No - Perform mobility assessment No - Perform mobility assessment   What is the highest level of mobility based on the mobility assessment? Level 3 (Stands with assistance) - Balance while standing  and cannot march in place Level 3 (Stands with assistance) - Balance while standing  and cannot march in place Level 3 (Stands with assistance) - Balance while standing  and cannot march in place Level 1 (Bedfast) - Unable to balance while sitting on edge of bed Level 5 (Ambulates independently) - Balance while walking independently - Complete   Is the above level different from baseline mobility prior to current illness? Yes - Recommend PT order Yes - Recommend PT order Yes - Recommend PT order Yes - Recommend PT order --      Barriers to discharge: None Disposition Plan: Home HH orders placed: N/A Status is: Inpatient  Objective: Blood pressure (!) 122/57, pulse (!) 108, temperature 99.2 F (37.3 C), temperature source Oral, resp. rate (!) 21, height 5' 2 (1.575 m), weight 72.6 kg, SpO2 97%.  Examination:  Physical Exam Constitutional:      General: She is not in acute distress.    Appearance: Normal appearance.  HENT:      Head: Normocephalic and atraumatic.     Mouth/Throat:     Mouth: Mucous membranes are moist.  Eyes:     Extraocular Movements: Extraocular movements intact.  Cardiovascular:     Rate and Rhythm: Normal rate and regular rhythm.  Pulmonary:     Effort: Pulmonary effort is normal. No respiratory distress.     Breath sounds: No wheezing.     Comments: Diffuse coarse breath sounds bilaterally Abdominal:     General: Bowel sounds are normal. There is no distension.     Palpations: Abdomen is soft.     Tenderness: There is no abdominal tenderness.  Musculoskeletal:        General: Normal range of motion.     Cervical back: Normal range of motion and neck supple.  Skin:    General: Skin is warm and dry.  Neurological:     General: No focal deficit present.     Mental Status: She is alert.  Psychiatric:        Mood and Affect: Mood normal.      Consultants:  Pulmonology - 9/14  Procedures:    Data Reviewed: Results for orders placed or performed during the hospital encounter of 03/08/24 (from the past 24 hours)  CBC with Differential/Platelet     Status: Abnormal   Collection Time: 03/10/24  2:58 AM  Result Value Ref Range   WBC 6.2 4.0 - 10.5 K/uL   RBC 2.62 (L) 3.87 - 5.11 MIL/uL   Hemoglobin 8.7 (L) 12.0 -  15.0 g/dL   HCT 72.4 (L) 63.9 - 53.9 %   MCV 105.0 (H) 80.0 - 100.0 fL   MCH 33.2 26.0 - 34.0 pg   MCHC 31.6 30.0 - 36.0 g/dL   RDW 85.7 88.4 - 84.4 %   Platelets 97 (L) 150 - 400 K/uL   nRBC 0.0 0.0 - 0.2 %   Neutrophils Relative % 79 %   Neutro Abs 4.9 1.7 - 7.7 K/uL   Lymphocytes Relative 15 %   Lymphs Abs 0.9 0.7 - 4.0 K/uL   Monocytes Relative 4 %   Monocytes Absolute 0.2 0.1 - 1.0 K/uL   Eosinophils Relative 0 %   Eosinophils Absolute 0.0 0.0 - 0.5 K/uL   Basophils Relative 0 %   Basophils Absolute 0.0 0.0 - 0.1 K/uL   Immature Granulocytes 2 %   Abs Immature Granulocytes 0.14 (H) 0.00 - 0.07 K/uL  Magnesium      Status: None   Collection Time: 03/10/24   2:58 AM  Result Value Ref Range   Magnesium  2.3 1.7 - 2.4 mg/dL  Basic metabolic panel with GFR     Status: Abnormal   Collection Time: 03/10/24  2:58 AM  Result Value Ref Range   Sodium 139 135 - 145 mmol/L   Potassium 3.8 3.5 - 5.1 mmol/L   Chloride 106 98 - 111 mmol/L   CO2 24 22 - 32 mmol/L   Glucose, Bld 178 (H) 70 - 99 mg/dL   BUN 12 8 - 23 mg/dL   Creatinine, Ser 9.42 0.44 - 1.00 mg/dL   Calcium  8.8 (L) 8.9 - 10.3 mg/dL   GFR, Estimated >39 >39 mL/min   Anion gap 10 5 - 15  Hemoglobin A1c     Status: Abnormal   Collection Time: 03/10/24  2:58 AM  Result Value Ref Range   Hgb A1c MFr Bld 4.7 (L) 4.8 - 5.6 %   Mean Plasma Glucose 88.19 mg/dL  Pro Brain natriuretic peptide     Status: Abnormal   Collection Time: 03/10/24  2:58 AM  Result Value Ref Range   Pro Brain Natriuretic Peptide 16,972.0 (H) <300.0 pg/mL   *Note: Due to a large number of results and/or encounters for the requested time period, some results have not been displayed. A complete set of results can be found in Results Review.    I have reviewed pertinent nursing notes, vitals, labs, and images as necessary. I have ordered labwork to follow up on as indicated.  I have reviewed the last notes from staff over past 24 hours. I have discussed patient's care plan and test results with nursing staff, CM/SW, and other staff as appropriate.  Time spent: Greater than 50% of the 55 minute visit was spent in counseling/coordination of care for the patient as laid out in the A&P.   LOS: 2 days   Alm Apo, MD [facility] 03/10/2024, 11:59 AM

## 2024-03-10 NOTE — TOC Initial Note (Signed)
 Transition of Care [facility]) - Initial/Assessment Note    Patient Details  Name: Jean Davidson MRN: [id number] Date of Birth: 1945/08/06  Transition of Care [facility]) CM/SW Contact:    Jon ONEIDA Anon, RN Phone Number: 03/10/2024, 4:12 PM  Clinical Narrative:                 Pt is from home. Currently requiring 35L O2 HHFNC. Pt needing continued medical workup, not medically stable for discharge. IP Care Management continuing to follow for any new recommendations or discharge needs.   Expected Discharge Plan:  (TBD) Barriers to Discharge: Continued Medical Work up   Patient Goals and CMS Choice Patient states their goals for this hospitalization and ongoing recovery are:: Return home CMS Medicare.gov Compare Post Acute Care list provided to:: Other (Comment Required) (NA) Choice offered to / list presented to : NA [employer] ownership interest in [facility].provided to:: Parent NA    Expected Discharge Plan and Services In-house Referral: NA Discharge Planning Services: CM Consult Post Acute Care Choice: NA Living arrangements for the past 2 months: Single Family Home                 DME Arranged: N/A DME Agency: NA       HH Arranged: NA HH Agency: NA        Prior Living Arrangements/Services Living arrangements for the past 2 months: Single Family Home Lives with:: Relatives Patient language and need for interpreter reviewed:: Yes Do you feel safe going back to the place where you live?: Yes      Need for Family Participation in Patient Care: Yes (Comment) Care giver support system in place?: Yes (comment)   Criminal Activity/Legal Involvement Pertinent to Current Situation/Hospitalization: No - Comment as needed  Activities of Daily Living   ADL Screening (condition at time of admission) Independently performs ADLs?: Yes (appropriate for developmental age) Is the patient deaf or have difficulty hearing?: No Does the patient have difficulty seeing, even  when wearing glasses/contacts?: No Does the patient have difficulty concentrating, remembering, or making decisions?: No  Permission Sought/Granted Permission sought to share information with : Family Supports Permission granted to share information with : Yes, Verbal Permission Granted  Share Information with NAME: Tierra, Thoma (Daughter)  [phone]           Emotional Assessment Appearance:: Appears stated age Attitude/Demeanor/Rapport: Unable to Assess Affect (typically observed): Unable to Assess   Alcohol  / Substance Use: Not Applicable Psych Involvement: No (comment)  Admission diagnosis:  Tachycardia [R00.0] Sepsis (HCC) [A41.9] Severe sepsis (HCC) [A41.9, R65.20] Patient Active Problem List   Diagnosis Date Noted   CAP (community acquired pneumonia) 03/09/2024   Severe sepsis (HCC) 03/08/2024   Acute hypoxic respiratory failure (HCC) 03/08/2024   Deficiency anemia 09/07/2023   Closed left ankle fracture 06/16/2022   Fluid overload 04/17/2022   Anemia due to antineoplastic chemotherapy 06/24/2021   Sinusitis 06/13/2021   Neutropenic fever (HCC) 04/05/2021   Hyperglycemia, drug-induced 07/29/2020   Financial difficulties 05/27/2020   Bilateral cataracts 12/04/2019   Headache disorder 09/11/2019   Weight gain 09/11/2019   Skin lesion of back 08/07/2019   Pelvic floor dysfunction in female 07/09/2019   Alopecia 07/09/2019   Squamous cell cancer of skin of left temple 06/04/2019   Exertional shortness of breath 02/14/2019   Vitamin B12 deficiency 09/25/2018   Large hiatal hernia 07/26/2018   Belching 07/26/2018   Early satiety 07/26/2018   URI (upper respiratory infection) 06/25/2018  Lung nodule seen on imaging study 06/25/2018   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/11/2018   Tinnitus of both ears 04/10/2018   Preventive measure 04/10/2018   Goals of care, counseling/discussion 02/20/2018   Pain of left scapula 12/06/2017   Vitamin D   deficiency 11/12/2017   Yeast infection of the skin 09/06/2017   Family hx of colon cancer requiring screening colonoscopy 01/17/2017   Cystocele and rectocele with incomplete uterovaginal prolapse 11/22/2016   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   Bilateral pleural effusion 01/25/2016   Pericardial effusion 01/24/2016   Physical deconditioning 01/11/2016   S/P autologous bone marrow transplantation (HCC) 11/08/2015   Therapeutic drug monitoring 11/08/2015   Red blood cell antibody positive, compatible PRBC difficult to obtain 07/30/2015   Cough 07/27/2015   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   Fatigue 01/26/2015   Essential hypertension 01/26/2015   Other fatigue 01/26/2015   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/2015   Chronic recurrent sinusitis 10/23/2014   Conjunctivitis 10/23/2014   Status post allogeneic bone marrow transplant (HCC) 08/14/2014   Leukopenia due to antineoplastic chemotherapy (HCC) 08/10/2014   Patient in clinical research study 07/21/2014   Need for prophylactic vaccination and inoculation against influenza 02/24/2014   Diarrhea 02/03/2014   Leukocytosis 02/03/2014   Constipation 11/21/2013   Rash 11/21/2013   Back pain 11/21/2013   Anxiety 11/21/2013   Multiple myeloma in remission (HCC) 11/07/2013   Pancytopenia, acquired (HCC) 10/29/2013   Thrombocytopenia (HCC) 10/29/2013   Persistent cough for 3 weeks or longer 10/02/2013   Abnormal weight loss 10/02/2013   Normocytic anemia 08/22/2013   Abdominal bloating 08/22/2013   Hypersensitivity 08/07/2013   Other malaise and fatigue 08/07/2013   Osteopenia 08/05/2013   GERD (gastroesophageal reflux disease) 01/16/2013   Insomnia disorder 01/16/2013   Carotid stenosis    Cervical disc disease    Hypertension 04/15/2012   Palpitations 03/29/2012   PCP:  Patient, [name] Pharmacy:   [facility] [address] [place] - [address] [place] [address] Phone: [phone] Fax: [phone]  [place] [facility] [address] [place] [address] Phone: [phone] Fax: [phone]  [facility] [place] [place] - [address] [place] [address] Phone: [phone] Fax: [phone]     Social Drivers of Health (SDOH) Social History: SDOH Screenings   Food Insecurity: No Food Insecurity (03/09/2024)  Housing: Low Risk  (03/09/2024)  Transportation Needs: No Transportation Needs (03/09/2024)  Utilities: Not At Risk (03/09/2024)  Depression (PHQ2-9): Low Risk  (03/05/2024)  Recent Concern: Depression (PHQ2-9) - Medium Risk (02/21/2024)  Financial Resource Strain: Low Risk  (10/09/2023)   Received from [facility]  Social Connections: Moderately Integrated (03/09/2024)  Tobacco Use: Medium Risk (03/09/2024)   SDOH Interventions:     Readmission Risk Interventions     No data to display

## 2024-03-10 NOTE — Plan of Care (Signed)
   Problem: Clinical Measurements: Goal: Ability to maintain clinical measurements within normal limits will improve Outcome: Progressing   Problem: Activity: Goal: Risk for activity intolerance will decrease Outcome: Progressing   Problem: Nutrition: Goal: Adequate nutrition will be maintained Outcome: Progressing

## 2024-03-10 NOTE — Plan of Care (Signed)
  Problem: Education: Goal: Knowledge of General Education information will improve Description: Including pain rating scale, medication(s)/side effects and non-pharmacologic comfort measures Outcome: Progressing   Problem: Health Behavior/Discharge Planning: Goal: Ability to manage health-related needs will improve Outcome: Progressing   Problem: Clinical Measurements: Goal: Ability to maintain clinical measurements within normal limits will improve Outcome: Progressing Goal: Diagnostic test results will improve Outcome: Progressing Goal: Respiratory complications will improve Outcome: Progressing Goal: Cardiovascular complication will be avoided Outcome: Progressing   Problem: Coping: Goal: Level of anxiety will decrease Outcome: Progressing

## 2024-03-11 ENCOUNTER — Ambulatory Visit: Admitting: Hematology and Oncology

## 2024-03-11 ENCOUNTER — Other Ambulatory Visit

## 2024-03-11 ENCOUNTER — Ambulatory Visit

## 2024-03-11 ENCOUNTER — Inpatient Hospital Stay (HOSPITAL_COMMUNITY)

## 2024-03-11 DIAGNOSIS — R6520 Severe sepsis without septic shock: Secondary | ICD-10-CM | POA: Diagnosis not present

## 2024-03-11 DIAGNOSIS — J189 Pneumonia, unspecified organism: Secondary | ICD-10-CM | POA: Diagnosis not present

## 2024-03-11 DIAGNOSIS — C9000 Multiple myeloma not having achieved remission: Secondary | ICD-10-CM | POA: Diagnosis not present

## 2024-03-11 DIAGNOSIS — A419 Sepsis, unspecified organism: Secondary | ICD-10-CM | POA: Diagnosis not present

## 2024-03-11 DIAGNOSIS — I5031 Acute diastolic (congestive) heart failure: Secondary | ICD-10-CM

## 2024-03-11 DIAGNOSIS — J9601 Acute respiratory failure with hypoxia: Secondary | ICD-10-CM | POA: Diagnosis not present

## 2024-03-11 DIAGNOSIS — C9001 Multiple myeloma in remission: Secondary | ICD-10-CM | POA: Diagnosis not present

## 2024-03-11 LAB — BASIC METABOLIC PANEL WITH GFR
Anion gap: 11 (ref 5–15)
Anion gap: 14 (ref 5–15)
BUN: 20 mg/dL (ref 8–23)
BUN: 22 mg/dL (ref 8–23)
CO2: 27 mmol/L (ref 22–32)
CO2: 24 mmol/L (ref 22–32)
Calcium: 8.7 mg/dL — ABNORMAL LOW (ref 8.9–10.3)
Calcium: 8.9 mg/dL (ref 8.9–10.3)
Chloride: 105 mmol/L (ref 98–111)
Chloride: 104 mmol/L (ref 98–111)
Creatinine, Ser: 0.69 mg/dL (ref 0.44–1.00)
Creatinine, Ser: 0.71 mg/dL (ref 0.44–1.00)
GFR, Estimated: >60 mL/min (ref >60)
GFR, Estimated: >60 mL/min (ref >60)
Glucose, Bld: 153 mg/dL — ABNORMAL HIGH (ref 70–99)
Glucose, Bld: 186 mg/dL — ABNORMAL HIGH (ref 70–99)
Potassium: 3.2 mmol/L — ABNORMAL LOW (ref 3.5–5.1)
Potassium: 3.2 mmol/L — ABNORMAL LOW (ref 3.5–5.1)
Sodium: 143 mmol/L (ref 135–145)
Sodium: 141 mmol/L (ref 135–145)

## 2024-03-11 LAB — CBC WITH DIFFERENTIAL/PLATELET
Abs Immature Granulocytes: 0.52 K/uL — ABNORMAL HIGH (ref 0.00–0.07)
Basophils Absolute: 0 K/uL (ref 0.0–0.1)
Basophils Relative: 0 %
Eosinophils Absolute: 0 K/uL (ref 0.0–0.5)
Eosinophils Relative: 0 %
HCT: 28.2 % — ABNORMAL LOW (ref 36.0–46.0)
Hemoglobin: 8.7 g/dL — ABNORMAL LOW (ref 12.0–15.0)
Immature Granulocytes: 7 %
Lymphocytes Relative: 14 %
Lymphs Abs: 1 K/uL (ref 0.7–4.0)
MCH: 32.2 pg (ref 26.0–34.0)
MCHC: 30.9 g/dL (ref 30.0–36.0)
MCV: 104.4 fL — ABNORMAL HIGH (ref 80.0–100.0)
Monocytes Absolute: 0.6 K/uL (ref 0.1–1.0)
Monocytes Relative: 8 %
Neutro Abs: 5.1 K/uL (ref 1.7–7.7)
Neutrophils Relative %: 71 %
Platelets: 103 K/uL — ABNORMAL LOW (ref 150–400)
RBC: 2.7 MIL/uL — ABNORMAL LOW (ref 3.87–5.11)
RDW: 14.4 % (ref 11.5–15.5)
Smear Review: NORMAL
WBC: 7.2 K/uL (ref 4.0–10.5)
nRBC: 0 % (ref 0.0–0.2)

## 2024-03-11 LAB — ECHOCARDIOGRAM COMPLETE
Area-P 1/2: 7.16 cm2
Height: 62 in
MV M vel: 5.22 m/s
MV Peak grad: 108.8 mmHg
MV VTI: 1.89 cm2
Radius: 0.4 cm
S' Lateral: 3.1 cm
Weight: 2560 [oz_av]

## 2024-03-11 LAB — GLUCOSE, CAPILLARY
Glucose-Capillary: 147 mg/dL — ABNORMAL HIGH (ref 70–99)
Glucose-Capillary: 139 mg/dL — ABNORMAL HIGH (ref 70–99)
Glucose-Capillary: 152 mg/dL — ABNORMAL HIGH (ref 70–99)
Glucose-Capillary: 269 mg/dL — ABNORMAL HIGH (ref 70–99)

## 2024-03-11 LAB — MAGNESIUM
Magnesium: 2.1 mg/dL (ref 1.7–2.4)
Magnesium: 2 mg/dL (ref 1.7–2.4)

## 2024-03-11 MED ORDER — SACCHAROMYCES BOULARDII 250 MG PO CAPS
250.0000 mg | ORAL_CAPSULE | Freq: Two times a day (BID) | ORAL | Status: DC
  Administered 2024-03-11 – 2024-03-20 (×20): 250 mg via ORAL
  Filled 2024-03-11 (×20): qty 1

## 2024-03-11 MED ORDER — DIPHENOXYLATE-ATROPINE 2.5-0.025 MG PO TABS: 1.0000 | ORAL_TABLET | Freq: Four times a day (QID) | ORAL | Status: DC | PRN

## 2024-03-11 MED ORDER — POTASSIUM CHLORIDE 20 MEQ PO PACK
40.0000 meq | PACK | Freq: Once | ORAL | Status: AC
  Administered 2024-03-11: 40 meq via ORAL
  Filled 2024-03-11: qty 2

## 2024-03-11 MED ORDER — FUROSEMIDE 10 MG/ML IJ SOLN
40.0000 mg | Freq: Two times a day (BID) | INTRAMUSCULAR | Status: DC
  Administered 2024-03-11 (×2): 40 mg via INTRAVENOUS
  Filled 2024-03-11 (×2): qty 4

## 2024-03-11 MED ORDER — POTASSIUM CHLORIDE CRYS ER 20 MEQ PO TBCR
40.0000 meq | EXTENDED_RELEASE_TABLET | ORAL | Status: AC
  Administered 2024-03-11 (×2): 40 meq via ORAL
  Filled 2024-03-11 (×2): qty 2

## 2024-03-11 MED ORDER — POTASSIUM CHLORIDE 10 MEQ/100ML IV SOLN
10.0000 meq | INTRAVENOUS | Status: DC
Start: 2024-03-11 — End: 2024-03-11
  Administered 2024-03-11 (×3): 10 meq via INTRAVENOUS
  Filled 2024-03-11 (×3): qty 100

## 2024-03-11 MED ORDER — PERFLUTREN LIPID MICROSPHERE
1.0000 mL | INTRAVENOUS | Status: AC | PRN
  Administered 2024-03-11: 3 mL via INTRAVENOUS

## 2024-03-11 NOTE — Progress Notes (Signed)
 NAME:  Jean Davidson, MRN:  [id number], DOB:  1946/01/06, LOS: 3 ADMISSION DATE:  03/08/2024, CONSULTATION DATE:  03/11/2024 REFERRING MD:  MELODIE, CHIEF COMPLAINT:  Respiratory Failure   History of Present Illness:  Jean Davidson is a 78 y/o F with relapsted/refractory multiple myeloma (in remission), GERD, HTN, and cervical stenosis who presents for acute hypoxic respiratory failure due to multifocal pneumonia and acute diastolic heart failure. Pulmonology consulted for evaluation and management of the latter  Pertinent  Medical History  As above  Significant Hospital Events: Including procedures, antibiotic start and stop dates in addition to other pertinent events   Admitted on 03/08/2024, started on cefepime , vanc, metronidazole , needed BiPAP but currently on HFNC BNP elevated to 16 K on 9/15, started diuresis  Interim History / Subjective:  Patient reports feeling improved. Coughing is gone. Dyspnea much better. HFNC setting is now 35LPM/55%  Objective    Blood pressure (!) 152/84, pulse 98, temperature 99 F (37.2 C), temperature source Oral, resp. rate (!) 27, height 5' 2 (1.575 m), weight 72.6 kg, SpO2 96%.    FiO2 (%):  [55 %-60 %] 55 %   Intake/Output Summary (Last 24 hours) at 03/11/2024 0915 Last data filed at 03/11/2024 9351 Gross per 24 hour  Intake 770 ml  Output 800 ml  Net -30 ml   Filed Weights   03/08/24 1106  Weight: 72.6 kg    Examination: General: well appearing, no distress HENT: PERRL, no oral thrush Lungs: crackles improved Cardiovascular: RRR, no r/m/g, JVD at 12 cm H2O Abdomen: soft, non-tender Extremities: 1+ pitting edema bilaterally Neuro: A&O x 3 GU: deferred  Resolved problem list   Assessment and Plan   #Acute Hypoxic Respiratory Failure: likely due to multifocal pneumonia and acute diastolic heart failure. Meets severity criteria for steroids in the setting of severe CAP. CT chest reviewed and shows extensive bilateral multifocal  consolidations with dense consolidation in lower lobe. RVP negative. MRSA swab and sputum culture pending. BNP ordered and was elevated at 16 K with concerns for pulmonary edema. - HFNC during day, wean as tolerated - BiPAP at night - MRSA swab negative, Vancomycin  was discontinued - Continue Cefepime , Metronidazole , Azithromycin  - total duration 5 days - Continue steroids for severe CAP with hydrocortisone  100 mg BID for 4 days - Diurese aggressively - FU echocardiogram read.  Disposition: appropriate for step down unit  Labs   CBC: Recent Labs  Lab 03/05/24 0757 03/08/24 1138 03/08/24 1216 03/09/24 0523 03/10/24 0258 03/11/24 0430  WBC 3.8* 4.2  --  5.4 6.2 7.2  NEUTROABS 2.2 1.8  --  3.2 4.9 5.1  HGB 11.8* 11.3* 11.2* 9.6* 8.7* 8.7*  HCT 35.2* 36.0 33.0* 31.9* 27.5* 28.2*  MCV 98.6 102.0*  --  105.3* 105.0* 104.4*  PLT 103* 112*  --  105* 97* 103*    Basic Metabolic Panel: Recent Labs  Lab 03/05/24 0757 03/08/24 1138 03/08/24 1216 03/09/24 0523 03/10/24 0258 03/11/24 0430  NA 141 141 140 140 139 143  K 3.5 3.6 4.4 3.9 3.8 3.2*  CL 106 104 105 104 106 105  CO2 27 22  --  22 24 27   GLUCOSE 111* 95 97 115* 178* 153*  BUN 15 11 14  7* 12 20  CREATININE 0.71 0.78 0.80 0.60 0.57 0.69  CALCIUM  8.8* 9.4  --  8.4* 8.8* 8.7*  MG 1.8  --   --  1.5* 2.3 2.1   GFR: Estimated Creatinine Clearance: 54.9 mL/min (by C-G formula based  on SCr of 0.69 mg/dL). Recent Labs  Lab 03/08/24 1138 03/08/24 1217 03/08/24 1407 03/08/24 1424 03/08/24 1728 03/08/24 1855 03/09/24 0523 03/09/24 0620 03/10/24 0258 03/11/24 0430  PROCALCITON  --   --  <0.10  --   --   --  <0.10  --   --   --   WBC 4.2  --   --   --   --   --  5.4  --  6.2 7.2  LATICACIDVEN  --    < >  --  5.3* 1.8 3.0*  --  2.2*  --   --    < > = values in this interval not displayed.    Liver Function Tests: Recent Labs  Lab 03/05/24 0757 03/08/24 1138  AST 16 41  ALT 9 12  ALKPHOS 96 99  BILITOT 0.4 0.3   PROT 6.4* 6.4*  ALBUMIN 4.2 4.1   No results for input(s): LIPASE, AMYLASE in the last 168 hours. No results for input(s): AMMONIA in the last 168 hours.  ABG    Component Value Date/Time   TCO2 25 03/08/2024 1216     Coagulation Profile: Recent Labs  Lab 03/08/24 1340  INR 1.1    Cardiac Enzymes: No results for input(s): CKTOTAL, CKMB, CKMBINDEX, TROPONINI in the last 168 hours.  HbA1C: Hgb A1c MFr Bld  Date/Time Value Ref Range Status  03/10/2024 02:58 AM 4.7 (L) 4.8 - 5.6 % Final    Comment:    (NOTE) Diagnosis of Diabetes The following HbA1c ranges recommended by the American Diabetes Association (ADA) may be used as an aid in the diagnosis of diabetes mellitus.  Hemoglobin             Suggested A1C NGSP%              Diagnosis  <5.7                   Non Diabetic  5.7-6.4                Pre-Diabetic  >6.4                   Diabetic  <7.0                   Glycemic control for                       adults with diabetes.    08/08/2013 11:27 AM 5.2 4.6 - 6.5 % Final    Comment:    Glycemic Control Guidelines for People with Diabetes:Non Diabetic:  <6%Goal of Therapy: <7%Additional Action Suggested:  >8%     CBG: Recent Labs  Lab 03/10/24 1245 03/10/24 1602 03/10/24 2137 03/11/24 0751  GLUCAP 177* 156* 203* 147*    Review of Systems:   Not obtained  Past Medical History:  She,  has a past medical history of Anemia, Anxiety, Blood transfusion without reported diagnosis, Carotid stenosis, Carpal tunnel syndrome, Cataract, Cervical disc disease, Cough (07/27/2015), Disc degeneration, Dysuria (07/26/2015), Failure of stem cell transplant (HCC), Fatigue (01/26/2015), Fever (07/27/2015), GERD (gastroesophageal reflux disease), Herpes virus 6 infection (01/28/2015), Hypertension, Internal and external hemorrhoids without complication, Macular degeneration, MDS (myelodysplastic syndrome) (HCC), Multiple myeloma (HCC), Osteopenia, Pinched nerve,  Sinusitis, bacterial (07/27/2015), Spinal stenosis in cervical region, and Varicose veins of lower extremities with inflammation.   Surgical History:   Past Surgical History:  Procedure Laterality Date   ANKLE SURGERY Left  BLADDER SUSPENSION  1988/1989   x2   COLONOSCOPY     ESOPHAGOGASTRODUODENOSCOPY     IR IMAGING GUIDED PORT INSERTION  03/01/2018   LIMBAL STEM CELL TRANSPLANT     x2   PORT-A-CATH REMOVAL     SHOULDER SURGERY Right 04/06/2013   right   UPPER GASTROINTESTINAL ENDOSCOPY       Social History:   reports that she quit smoking about 55 years ago. Her smoking use included cigarettes. She started smoking about 59 years ago. She has a 2 pack-year smoking history. She has never used smokeless tobacco. She reports current alcohol  use of about 1.0 standard drink of alcohol  per week. She reports that she does not use drugs.   Family History:  Her family history includes Colon cancer (age of onset: 45) in her paternal aunt; Diabetes in her father; Heart disease in her brother, father, and mother; Heart failure in her father; Hypertension in her brother; Kidney disease in her father; Skin cancer in her brother and father; Throat cancer in her father. There is no history of Stomach cancer, Esophageal cancer, or Rectal cancer.   Allergies No Known Allergies   Home Medications  Prior to Admission medications   Medication Sig Start Date End Date Taking? Authorizing Provider  acyclovir  (ZOVIRAX ) 400 MG tablet Take 1 tablet (400 mg total) by mouth 2 (two) times daily. 08/07/23  Yes Gorsuch, Almarie, MD  aspirin  EC 81 MG tablet Take 81 mg by mouth daily. Swallow whole.   Yes [provider]  atovaquone  (MEPRON ) 750 MG/5ML suspension TAKE 10 MLS (1,500 MG TOTAL) BY MOUTH 2 (TWO) TIMES DAILY. 03/04/24  Yes Federico Norleen ONEIDA MADISON, MD  cholecalciferol  (VITAMIN D3) 25 MCG (1000 UNIT) tablet Take 4,000 Units by mouth daily. Instructed to increase to 4,000 units daily   Yes [provider]  ciprofloxacin  (CIPRO ) 250 MG tablet Take 3 tablets (750 mg total) by mouth 2 (two) times daily for 7 days. 03/05/24 03/12/24 Yes Walisiewicz, Kaitlyn E, PA-C  metoprolol  tartrate (LOPRESSOR ) 25 MG tablet TAKE 1 & 1/2 TABLET BY MOUTH TWICE A DAY--due for follow up appointment. 08/01/23  Yes Furth, Cadence H, PA-C  mirtazapine  (REMERON ) 15 MG tablet TAKE 1 TABLET BY MOUTH EVERYDAY AT BEDTIME 01/28/24  Yes Dorsey, John T IV, MD  Multiple Vitamins-Minerals (PRESERVISION AREDS 2 PO) Take 1 tablet by mouth 2 (two) times daily.   Yes [provider]  ondansetron  (ZOFRAN ) 8 MG tablet Take 1 tablet (8 mg total) by mouth every 8 (eight) hours as needed (Nausea or vomiting). Patient taking differently: Take 8 mg by mouth 2 (two) times daily. 09/26/22  Yes Gorsuch, Ni, MD  pantoprazole  (PROTONIX ) 40 MG tablet TAKE 1 TABLET BY MOUTH 2 TIMES DAILY. TAKE 30-60 MINUTES BEFORE BREAKFAST AND DINNER. 03/06/24  Yes Avram Lupita BRAVO, MD  prochlorperazine  (COMPAZINE ) 5 MG tablet Take 5 mg by mouth every 6 (six) hours as needed for nausea or vomiting.   Yes [provider]  senna-docusate (SENOKOT-S) 8.6-50 MG tablet Take 2 tablets by mouth daily as needed. 10/23/22  Yes [provider]     Thank you for this interesting consult. I have spent 45 minutes evaluating patient, reviewing chart, and discussing plan of care with patient, family, and primary medical team. If you have any questions or concerns please reach out to me via pager ([phone]).  Paula Southerly, MD [facility]

## 2024-03-11 NOTE — Consult Note (Signed)
 [facility]                                                                                        Patient Identification: Patient Name: Jean Davidson MRN: [id number] Admit Date: 03/08/2024 11:03 AM Today's Date: 03/11/2024 Reason for consult: PNA, immunocompromised host Requesting provider: Dr. Patsy  Principal Problem:   Severe sepsis [facility]) Active Problems:   GERD (gastroesophageal reflux disease)   Multiple myeloma in remission (HCC)   Diarrhea   Essential hypertension   Peripheral neuropathy due to chemotherapy (HCC)   Anemia due to antineoplastic chemotherapy   Acute hypoxic respiratory failure (HCC)   CAP (community acquired pneumonia)   Antibiotics:  Cefepime  9/13- Metronidazole  9/13 Vancomycin  9/13-  Acyclovir  and atovaquone  ppx   Lines/Hardware:rt reverse shoulder arthroplasty   Assessment # Acute hypoxic respiratory failure - Possible Due to common bacterial pathogens, fluid overload - She reports compliance with a cycle of year but missing approximately 2 days of atovaquone  and a week - She has clinical improving with improved cough as well as down to 35 mL/min oxygen .  - Pulmonology following -on hydrocortisone  for severe as well as diuretics for fluid overload  # MM  - Per oncology, due for IVIG on 9/17 - maintenance  chemotherapy with Elrexfio  (Last dose cycle 6 on 02/27/24) - Continue atovaquone  and acyclovir  prophylaxis  Recommendations  - Continue IV cefepime , metronidazole , azithromycin  - fu Legionella urine antigen, sputum culture( not collected) and TTE  - Volume management per pulmonary/primary - Given continued clinical improvement with current antibiotics and volume management, may not need further aggressive workup for other opportunistic infections - Universal/standard isolation precaution D/w primary team  Rest of the management as per the primary  team. Please call with questions or concerns.  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 She started getting more SOB with cough and URI symptoms on day of arrival. H/o chronic diarrhea with no recent change. Spo2 89% on RA with EMS. No reported abdominal pain, flank pain or GU complaints.   She was seen at oncology office 9/10 for low-grade fever and was given IVF.  Influenza A/influenza B/RSV/SARS-CoV-2 negative.  9/10 blood culture 2 sets negative.  UA cloudy with large leukocytes.,  Negative nitrite, many bacteria, RBC 21-50, WBC more than 50.  Urine culture with multiple species.  CXR unremarkable. Started on ciprofloxacin  for concerns for UTI although reported no urinary symptoms.   At ED, afebrile Labs remarkable for LA 2, hb 11.3, plts 112  Influenza A/influenza B/RSV/SARS-CoV-2 negative UA unremarkable for UTI 9/13 blood  culture 2/2 sets no growth to date, RVP negative  9/15 MRSA PCR negative 9/15 strep pneumo antigen negative  Received IVF, Vancomycin , cefepime  and metronidazole    CTA 9/14  1. No pulmonary embolism. 2. Extensive bilateral airspace infiltrate with bilateral lower lobe dependent consolidation, consistent with multifocal infection or aspiration. 3. Small bilateral pleural effusions. 4.  Moderate hiatal hernia.  Hospital course complicated with increasing oxygen  requirements with high flow.  Pulmonology consulted   ROS: General- Denies fever, chills, loss of appetite and loss of weight HEENT - Denies headache, blurry vision, neck pain, sinus pain Chest - Denies any chest pain, SOB and cough improving  CVS- Denies any dizziness/lightheadedness, syncopal attacks, palpitations Abdomen- Denies any nausea, vomiting, abdominal pain, hematochezia  Neuro - Denies any weakness, numbness, tingling sensation Psych - Denies any changes in mood irritability or depressive symptoms GU- Denies any burning, dysuria, hematuria or increased frequency of urination Skin - denies any rashes/lesions MSK - denies any joint pain/swelling or restricted ROM   Past Medical History:  Diagnosis Date   Anemia    Anxiety    Blood transfusion without reported diagnosis    Carotid stenosis    Mild bilateral   Carpal tunnel syndrome    Cataract    Cervical disc disease    c7   Cough 07/27/2015   Disc degeneration    Dysuria 07/26/2015   Failure of stem cell transplant (HCC)    Fatigue 01/26/2015   Fever 07/27/2015   GERD (gastroesophageal reflux disease)    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   Multiple myeloma (HCC)    Osteopenia    Pinched nerve    Sinusitis, bacterial 07/27/2015   Spinal stenosis in cervical region    Varicose veins of lower extremities with inflammation    Past Surgical History:  Procedure Laterality Date   ANKLE SURGERY Left    BLADDER SUSPENSION  1988/1989   x2   COLONOSCOPY     ESOPHAGOGASTRODUODENOSCOPY     IR IMAGING GUIDED PORT INSERTION  03/01/2018   LIMBAL STEM CELL TRANSPLANT     x2   PORT-A-CATH REMOVAL     SHOULDER SURGERY Right  04/06/2013   right   UPPER GASTROINTESTINAL ENDOSCOPY      Scheduled Meds:  acyclovir   400 mg Oral BID   aspirin  EC  81 mg Oral Daily   atovaquone   1,500 mg Oral BID   Chlorhexidine  Gluconate Cloth  6 each Topical Daily   cholecalciferol   4,000 Units Oral Daily   furosemide   40 mg Intravenous BID   hydrocortisone  sod succinate (SOLU-CORTEF ) inj  100 mg Intravenous Q12H   insulin  aspart  0-6 Units Subcutaneous TID AC & HS   mirtazapine   15 mg Oral QHS   multivitamin  2 tablet Oral BID   pantoprazole   40 mg Oral Daily   saccharomyces boulardii  250 mg Oral BID   Continuous Infusions:  azithromycin  Stopped (03/10/24 1255)   ceFEPime  (MAXIPIME ) IV Stopped (03/10/24 2352)   metronidazole  Stopped (03/10/24 2312)   potassium chloride  10 mEq (03/11/24 0814)   PRN Meds:.acetaminophen  **OR** acetaminophen , antiseptic oral rinse, calcium  carbonate, guaiFENesin -dextromethorphan , hydrALAZINE , labetalol , levalbuterol , loperamide , melatonin, metoprolol  tartrate, morphine  injection, ondansetron  **OR** ondansetron  (ZOFRAN ) IV, mouth rinse, polyethylene glycol, sodium chloride   flush  No Known Allergies  Social History   Socioeconomic History   Marital status: Divorced    Spouse name: Not on file   Number of children: Not on file   Years of education: Not on file   Highest education level: Not on file  Occupational History   Not on file  Tobacco Use   Smoking status: Former    Current packs/day: 0.00    Average packs/day: 0.5 packs/day for 4.0 years (2.0 ttl pk-yrs)    Types: Cigarettes    Start date: 06/26/1964    Quit date: 06/26/1968    Years since quitting: 55.7   Smokeless tobacco: Never  Vaping Use   Vaping status: Never Used  Substance and Sexual Activity   Alcohol  use: Yes    Alcohol /week: 1.0 standard drink of alcohol     Types: 1 Cans of beer per week    Comment: 1 per week per pt   Drug use: No   Sexual activity: Not on file  Other Topics Concern   Not on file  Social  History Narrative   [name] is daughter aaniyah, strohm will,  full code ( reviewed 28)   Social Drivers of Health   Financial Resource Strain: Low Risk  (10/09/2023)   [facility]   Overall Financial Resource Strain (CARDIA)    Difficulty of Paying Living Expenses: Not hard at all  Food Insecurity: No Food Insecurity (03/09/2024)   Hunger Vital Sign    Worried About Running Out of Food in the Last Year: Never true    Ran Out of Food in the Last Year: Never true  Transportation Needs: No Transportation Needs (03/09/2024)   PRAPARE - Administrator, Civil Service (Medical): No    Lack of Transportation (Non-Medical): No  Physical Activity: Not on file  Stress: Not on file  Social Connections: Moderately Integrated (03/09/2024)   Social Connection and Isolation Panel    Frequency of Communication with Friends and Family: More than three times a week    Frequency of Social Gatherings with Friends and Family: Twice a week    Attends Religious Services: 1 to 4 times per year    Active Member of [employer] or Organizations: Yes    Attends Banker Meetings: 1 to 4 times per year    Marital Status: Divorced  Catering manager Violence: Not At Risk (03/09/2024)   Humiliation, Afraid, Rape, and Kick questionnaire    Fear of Current or Ex-Partner: No    Emotionally Abused: No    Physically Abused: No    Sexually Abused: No   Family History  Problem Relation Age of Onset   Heart disease Mother    Heart failure Father    Heart disease Father    Throat cancer Father    Skin cancer Father    Diabetes Father    Kidney disease Father    Hypertension Brother    Heart disease Brother        congenital shunt, ?    Colon cancer Paternal Aunt 18   Skin cancer Brother    Stomach cancer Neg Hx    Esophageal cancer Neg Hx    Rectal cancer Neg Hx     Vitals BP (!) 114/93 (BP Location: Right Wrist)   Pulse 93   Temp 99.3 F (37.4 C)  (Oral)   Resp 20   Ht 5' 2 (1.575 m)   Wt 76.1 kg   SpO2 97%  BMI 30.69 kg/m    Physical Exam Constitutional: Elderly female sitting in the recliner, on HFNC    Comments: HEENT WNL, able to speak in full sentences  Cardiovascular:     Rate and Rhythm: Normal rate and regular rhythm.     Heart sounds: S1 and S2  Pulmonary:     Effort: Pulmonary effort is normal.     Comments: Crackles at the bases  Abdominal:     Palpations: Abdomen is soft.     Tenderness: Nontender and nondistended  Musculoskeletal:        General: No swelling or tenderness in peripheral joints  Skin:    Comments: No rashes  Neurological:     General: Awake, alert and oriented, grossly nonfocal  Psychiatric:        Mood and Affect: Mood normal.    Pertinent Microbiology Results for orders placed or performed during the hospital encounter of 03/08/24  Resp panel by RT-PCR (RSV, Flu A&B, Covid) Anterior Nasal Swab     Status: None   Collection Time: 03/08/24 11:38 AM   Specimen: Anterior Nasal Swab  Result Value Ref Range Status   SARS Coronavirus 2 by RT PCR NEGATIVE NEGATIVE Final    Comment: (NOTE) SARS-CoV-2 target nucleic acids are NOT DETECTED.  The SARS-CoV-2 RNA is generally detectable in upper respiratory specimens during the acute phase of infection. The lowest concentration of SARS-CoV-2 viral copies this assay can detect is 138 copies/mL. A negative result does not preclude SARS-Cov-2 infection and should not be used as the sole basis for treatment or other patient management decisions. A negative result may occur with  improper specimen collection/handling, submission of specimen other than nasopharyngeal swab, presence of viral mutation(s) within the areas targeted by this assay, and inadequate number of viral copies(<138 copies/mL). A negative result must be combined with clinical observations, patient history, and epidemiological information. The expected result is  Negative.  Fact Sheet for Patients:  BloggerCourse.com  Fact Sheet for Healthcare Providers:  SeriousBroker.it  This test is no t yet approved or cleared by the [place]  FDA and  has been authorized for detection and/or diagnosis of SARS-CoV-2 by FDA under an Emergency Use Authorization (EUA). This EUA will remain  in effect (meaning this test can be used) for the duration of the COVID-19 declaration under Section 564(b)(1) of the Act, 21 U.S.C.section 360bbb-3(b)(1), unless the authorization is terminated  or revoked sooner.       Influenza A by PCR NEGATIVE NEGATIVE Final   Influenza B by PCR NEGATIVE NEGATIVE Final    Comment: (NOTE) The Xpert Xpress SARS-CoV-2/FLU/RSV plus assay is intended as an aid in the diagnosis of influenza from Nasopharyngeal swab specimens and should not be used as a sole basis for treatment. Nasal washings and aspirates are unacceptable for Xpert Xpress SARS-CoV-2/FLU/RSV testing.  Fact Sheet for Patients: BloggerCourse.com  Fact Sheet for Healthcare Providers: SeriousBroker.it  This test is not yet approved or cleared by the [place]  FDA and has been authorized for detection and/or diagnosis of SARS-CoV-2 by FDA under an Emergency Use Authorization (EUA). This EUA will remain in effect (meaning this test can be used) for the duration of the COVID-19 declaration under Section 564(b)(1) of the Act, 21 U.S.C. section 360bbb-3(b)(1), unless the authorization is terminated or revoked.     Resp Syncytial Virus by PCR NEGATIVE NEGATIVE Final    Comment: (NOTE) Fact Sheet for Patients: BloggerCourse.com  Fact Sheet for Healthcare Providers: SeriousBroker.it  This test is not  yet approved or cleared by the [place]  FDA and has been authorized for detection and/or diagnosis of  SARS-CoV-2 by FDA under an Emergency Use Authorization (EUA). This EUA will remain in effect (meaning this test can be used) for the duration of the COVID-19 declaration under Section 564(b)(1) of the Act, 21 U.S.C. section 360bbb-3(b)(1), unless the authorization is terminated or revoked.  Performed at [facility], [address] [place], [place] [address]   Respiratory (~20 pathogens) panel by PCR     Status: None   Collection Time: 03/08/24 11:38 AM   Specimen: Nasopharyngeal Swab; Respiratory  Result Value Ref Range Status   Adenovirus NOT DETECTED NOT DETECTED Final   Coronavirus 229E NOT DETECTED NOT DETECTED Final    Comment: (NOTE) The Coronavirus on the Respiratory Panel, DOES NOT test for the novel  Coronavirus (2019 nCoV)    Coronavirus HKU1 NOT DETECTED NOT DETECTED Final   Coronavirus NL63 NOT DETECTED NOT DETECTED Final   Coronavirus OC43 NOT DETECTED NOT DETECTED Final   Metapneumovirus NOT DETECTED NOT DETECTED Final   Rhinovirus / Enterovirus NOT DETECTED NOT DETECTED Final   Influenza A NOT DETECTED NOT DETECTED Final   Influenza B NOT DETECTED NOT DETECTED Final   Parainfluenza Virus 1 NOT DETECTED NOT DETECTED Final   Parainfluenza Virus 2 NOT DETECTED NOT DETECTED Final   Parainfluenza Virus 3 NOT DETECTED NOT DETECTED Final   Parainfluenza Virus 4 NOT DETECTED NOT DETECTED Final   Respiratory Syncytial Virus NOT DETECTED NOT DETECTED Final   Bordetella pertussis NOT DETECTED NOT DETECTED Final   Bordetella Parapertussis NOT DETECTED NOT DETECTED Final   Chlamydophila pneumoniae NOT DETECTED NOT DETECTED Final   Mycoplasma pneumoniae NOT DETECTED NOT DETECTED Final    Comment: Performed at [facility], [address] [place], [place] [address]  Blood Culture (routine x 2)     Status: None (Preliminary result)   Collection Time: 03/08/24  1:34 PM   Specimen: BLOOD LEFT ARM  Result Value Ref Range Status   Specimen Description    Final    BLOOD LEFT ARM Performed at [facility], [address] [place], [place] [address]    Special Requests   Final    BOTTLES DRAWN AEROBIC ONLY Blood Culture results may not be optimal due to an inadequate volume of blood received in culture bottles Performed at [facility], [address], [place], [place] [address]    Culture   Final    NO GROWTH 3 DAYS Performed at [facility], [address], [place], [place] [address]    Report Status PENDING  Incomplete  Blood Culture (routine x 2)     Status: None (Preliminary result)   Collection Time: 03/08/24  1:58 PM   Specimen: BLOOD LEFT ARM  Result Value Ref Range Status   Specimen Description   Final    BLOOD LEFT ARM Performed at [facility], [address], [place], [place] [address]    Special Requests   Final    BOTTLES DRAWN AEROBIC AND ANAEROBIC Blood Culture adequate volume Performed at [facility], [address] [place], [place] [address]    Culture   Final    NO GROWTH 3 DAYS Performed at [facility], [address] [place], [place] [address]    Report Status PENDING  Incomplete  MRSA Next Gen by PCR, Nasal     Status: None  Collection Time: 03/10/24 10:53 AM   Specimen: Nasal Mucosa; Nasal Swab  Result Value Ref Range Status   MRSA by PCR Next Gen NOT DETECTED NOT DETECTED Final    Comment: (NOTE) The GeneXpert MRSA Assay (FDA approved for NASAL specimens only), is one component of a comprehensive MRSA colonization surveillance program. It is not intended to diagnose MRSA infection nor to guide or monitor treatment for MRSA infections. Test performance is not FDA approved in patients less than 71 years old. Performed at [facility], [address] [place], [place] [address]    *Note: Due to a large number of results and/or encounters for the requested time period, some results have not been displayed. A complete set  of results can be found in Results Review.    Pertinent Lab seen by me:    Latest Ref Rng & Units 03/11/2024    4:30 AM 03/10/2024    2:58 AM 03/09/2024    5:23 AM  CBC  WBC 4.0 - 10.5 K/uL 7.2  6.2  5.4   Hemoglobin 12.0 - 15.0 g/dL 8.7  8.7  9.6   Hematocrit 36.0 - 46.0 % 28.2  27.5  31.9   Platelets 150 - 400 K/uL 103  97  105       Latest Ref Rng & Units 03/11/2024    1:33 PM 03/11/2024    4:30 AM 03/10/2024    2:58 AM  CMP  Glucose 70 - 99 mg/dL 813  846  821   BUN 8 - 23 mg/dL 22  20  12    Creatinine 0.44 - 1.00 mg/dL 9.28  9.30  9.42   Sodium 135 - 145 mmol/L 141  143  139   Potassium 3.5 - 5.1 mmol/L 3.2  3.2  3.8   Chloride 98 - 111 mmol/L 104  105  106   CO2 22 - 32 mmol/L 24  27  24    Calcium  8.9 - 10.3 mg/dL 8.9  8.7  8.8      Pertinent Imagings/Other Imagings Plain films and CT images have been personally visualized and interpreted; radiology reports have been reviewed. Decision making incorporated into the Impression / Recommendations.  CT Angio Chest Pulmonary Embolism (PE) W or WO Contrast Result Date: 03/09/2024 EXAM: CTA of the Chest with contrast for PE 03/09/2024 12:17:09 AM TECHNIQUE: CTA of the chest was performed after the administration of intravenous contrast. Multiplanar reformatted images are provided for review. MIP images are provided for review. Automated exposure control, iterative reconstruction, and/or weight based adjustment of the mA/kV was utilized to reduce the radiation dose to as low as reasonably achievable. COMPARISON: None available. CLINICAL HISTORY: Pulmonary embolism (PE) suspected, high prob. FINDINGS: PULMONARY ARTERIES: Pulmonary arteries are adequately opacified for evaluation. No pulmonary embolism. Main pulmonary artery is normal in caliber. MEDIASTINUM: The heart and pericardium demonstrate no acute abnormality. No pericardial effusion. Mild atherosclerotic calcification within the thoracic aorta. No aortic aneurysm. LYMPH NODES: No  mediastinal, hilar or axillary lymphadenopathy. LUNGS AND PLEURA: Extensive bilateral airspace infiltrate with bilateral lower lobe dependent consolidation in keeping with multifocal infection or aspiration. Small bilateral pleural effusions. No pneumothorax. No central obstructing lesion. UPPER ABDOMEN: Moderate hiatal hernia. Cholelithiasis without superimposed pericholecystic inflammatory change. No intra or extrahepatic biliary ductal dilation. SOFT TISSUES AND BONES: Sclerotic margined lytic lesion within the T3 vertebral body, possibly an intraosseous hemangioma, with superimposed pathologic compression deformity noted. This is likely chronic in nature. Chronic mild L5 compression deformity noted. No retropulsion. Osseous  structures are otherwise age appropriate. Right reverse shoulder arthroplasty noted. No acute bone abnormality. IMPRESSION: 1. No pulmonary embolism. 2. Extensive bilateral airspace infiltrate with bilateral lower lobe dependent consolidation, consistent with multifocal infection or aspiration. 3. Small bilateral pleural effusions. 4. Moderate hiatal hernia. Electronically signed by: Dorethia Molt MD 03/09/2024 12:24 AM EDT RP Workstation: [id number]   DG Chest Port 1 View Result Date: 03/08/2024 EXAM: 1 VIEW XRAY OF THE CHEST 03/08/2024 11:37:42 AM COMPARISON: 03/05/2024 CLINICAL HISTORY: Questionable sepsis - evaluate for abnormality. Per chart: tachycardia. She is currently receiving chemo. Was seen last week for fevers at outpatient office. 89% on RA during triage. FINDINGS: LUNGS AND PLEURA: Calcified right lower lobe granuloma. No focal pulmonary opacity. No pulmonary edema. No pleural effusion. No pneumothorax. HEART AND MEDIASTINUM: Aortic calcification. No acute abnormality of the cardiac and mediastinal silhouettes. BONES AND SOFT TISSUES: Right shoulder arthroplasty noted. Old healed right rib fractures. Degenerative changes of spine. No acute osseous abnormality. IMPRESSION: 1.  No acute findings. Electronically signed by: Waddell Calk MD 03/08/2024 12:10 PM EDT RP Workstation: [id number]   DG Chest 2 View Result Date: 03/05/2024 CLINICAL DATA:  Fever, shortness of breath EXAM: CHEST - 2 VIEW COMPARISON:  April 05, 2021 chest radiograph FINDINGS: Cyst unchanged chronic calcified pulmonary granuloma in the right lower lung. No focal consolidations. No pleural effusions. No pneumothorax. Unchanged cardiomediastinal silhouette. No acute osseous findings. Partially imaged right shoulder arthroplasty hardware. IMPRESSION: No acute findings. Electronically Signed   By: Michaeline Blanch M.D.   On: 03/05/2024 12:07    I spent 85 minutes involved in face-to-face and non-face-to-face activities for this patient on the day of the visit. Professional time spent includes the following activities: Preparing to see the patient (review of tests), Obtaining and reviewing separately obtained history (ED note, H&P, hospitalist progress note, last oncology note), Performing a medically appropriate examination and evaluation, Ordering medications/labs, referring and communicating with other health care professionals including primary team, Documenting clinical information in the EMR, Independently interpreting results (not separately reported), Communicating results to the patient, Counseling and educating the patient and Care coordination (not separately reported).  Electronically signed by:   Plan d/w requesting provider as well as ID pharm D  Of note, portions of this note may have been created with voice recognition software. While this note has been edited for accuracy, occasional wrong-word or 'sound-a-like' substitutions may have occurred due to the inherent limitations of voice recognition software.   Annalee Orem, MD Infectious Disease Physician [facility] for [facility] Disease Pager: [phone]

## 2024-03-11 NOTE — Plan of Care (Signed)
  Problem: Clinical Measurements: Goal: Respiratory complications will improve Outcome: Progressing   Problem: Activity: Goal: Risk for activity intolerance will decrease Outcome: Progressing   Problem: Nutrition: Goal: Adequate nutrition will be maintained Outcome: Progressing   Problem: Nutritional: Goal: Maintenance of adequate nutrition will improve Outcome: Progressing

## 2024-03-11 NOTE — Progress Notes (Signed)
 Progress Note    Jean Davidson   FMW:[id number]  DOB: [birth year]  DOA: 03/08/2024     3 PCP: Patient, [name]  Initial CC: Shortness of breath  Hospital Course: Ms. Duncanson is a 78 year old female with PMH Relapsed/Refractory IgA Lambda Multiple Myeloma, In Remission, GERD, HTN, macular degeneration, cervical spinal stenosis who presented with dyspnea. She had recently been seen outpatient with low-grade fevers and a urinalysis was checked and she was placed on ciprofloxacin  for presumed UTI however she was having no urinary symptoms. Due to worsening dyspnea, nonproductive cough and persistent low-grade fevers less than 101 F, she presented for further evaluation. She was initially afebrile on workup with mild tachycardia and worsening hypoxia. She ultimately required escalation of oxygen  up to high flow. Initial CXR was clear and unremarkable. Due to worsening oxygen  demands, CT angio chest was also ordered. WBC 4.2 with 2% bands, but also on elranatamab .  COVID, flu, RSV negative. PCT negative. Lactic acid initially 2 which further up trended to 5.3. She received 3.25 L IVF bolus in the ER Abx started: Vanc, cefepime , flagyl   Given acute respiratory failure despite CXR she was admitted for sepsis treatment with presumed pulmonary source.   CT angio chest was able to finally be obtained after admission and negative for PE but showed extensive bilateral airspace disease consistent with multifocal pneumonia. She even required escalation of oxygen  up to high flow nasal cannula supplemented with BiPAP at times due to work of breathing.  Interval History:  Slow weaning of HHFNC since yesterday. Subjectively feeling better too. Much less SOB compared to admission. Eating meals easily.  Multiple questions answered bedside.   Assessment and Plan: * Severe sepsis (HCC) - Tachycardia, neutropenia recently, lactic acidosis; suspected pulm source - CT angio chest confirmed multifocal  severe pneumonia -Continue broad-spectrum antibiotics (vanc, cefepime , flagyl ) - PCCM added azithro x 3 days on 9/15 - ID consulted 9/16 after oncology discussed case with bispecific committee; patient has been noncompliant with atovaquone  at home due to taste (takes every couple days)  Acute hypoxic respiratory failure (HCC) - Etiology confirmed after CTA chest as severe multifocal pneumonia -PE ruled out on CTA chest - HFNC, wean as able - okay with BiPAP for WOB at times; pulmonology recommending BiPAP at night -Solu-Medrol  transitioned to Solu-Cortef  for 4 days on 03/10/2024 per pulm - follow up echo   CAP (community acquired pneumonia) - Increased risk for MDRO given immunosuppression -CTA chest negative for PE, showing multifocal severe pneumonia -Started on vancomycin , cefepime , Flagyl  on admission, continue - Azithromycin  x 3 days started 9/15 per pulmonology -Due to severe hypoxia, adding steroids -Pulmonology following - Patient also not compliant with atovaquone  at home prior to hospitalization only taking every couple days due to the taste; ID consulted 9/16 after oncology discussed case with bispecific committee; patient has been noncompliant with atovaquone  at home  Multiple myeloma in remission [facility]) -follows with Dr. Federico outpatient - 2016: Underwent autologous stem cell transplant. 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 10/16/2022: Completed CAR-T therapy with [employer]. 09/2023: Started on Elrexfio  due to progression of disease. 01/02/2024: Last visit at [facility] where induction therapy was completed - Now on Elranatamab  (Elrexfio ); just had cycle 6 on 02/27/24 - Continue atovaquone  (see above) and acyclovir  - Discussed case again with Dr. Federico on 03/11/2024; patient due for IVIG on 03/12/2024.  Okay to wait until outpatient follow-up for infusion  Anemia due to antineoplastic chemotherapy - Stable hemoglobin 11 to 12 g/dL  Essential hypertension -  Soft blood pressure initially in the ER but has up trended -Hold home meds for now and can use as needed meds  Diarrhea - History of chronic diarrhea - Stool studies ordered on admission but no significant diarrhea therefore canceled. No abdominal pain noted - Also recently on ciprofloxacin  for a couple days after concern for outpatient UTI.  Urine culture from 03/05/2024 negative for growth (multiple species present)--- blood cultures at that time also negative to date  - May just be having some diarrhea from antibiotics that were started on admission as well.  No abdominal pain and remains afebrile -Okay to rotate with Imodium  and Lomotil   GERD (gastroesophageal reflux disease) - Continue Protonix    Old records reviewed in assessment of this patient  Antimicrobials: Vancomycin  03/08/2024 >> current Cefepime  03/08/2024 >> current Flagyl  03/08/2024 >> current Azithromycin  03/10/2024 >> 03/12/2024 Atovaquone  (home med) >> continued  Acyclovir  (home med) >> continued   DVT prophylaxis:  SCDs Start: 03/08/24 1347   Code Status:   Code Status: Do not attempt resuscitation (DNR) PRE-ARREST INTERVENTIONS DESIRED  Mobility Assessment (Last 72 Hours)     Mobility Assessment     Row Name 03/11/24 0800 03/10/24 2000 03/10/24 0900 03/09/24 2000 03/09/24 0730   Does the patient have exclusion criteria? No - Perform mobility assessment No - Perform mobility assessment No - Perform mobility assessment No - Perform mobility assessment No - Perform mobility assessment   What is the highest level of mobility based on the mobility assessment? Level 3 (Stands with assistance) - Balance while standing  and cannot march in place Level 3 (Stands with assistance) - Balance while standing  and cannot march in place Level 3 (Stands with assistance) - Balance while standing  and cannot march in place Level 3 (Stands with assistance) - Balance while standing  and cannot march in place Level 3 (Stands with  assistance) - Balance while standing  and cannot march in place   Is the above level different from baseline mobility prior to current illness? Yes - Recommend PT order Yes - Recommend PT order Yes - Recommend PT order Yes - Recommend PT order Yes - Recommend PT order    Row Name 03/08/24 2000 03/08/24 1843         Does the patient have exclusion criteria? No - Perform mobility assessment No - Perform mobility assessment      What is the highest level of mobility based on the mobility assessment? Level 1 (Bedfast) - Unable to balance while sitting on edge of bed Level 5 (Ambulates independently) - Balance while walking independently - Complete      Is the above level different from baseline mobility prior to current illness? Yes - Recommend PT order --         Barriers to discharge: None Disposition Plan: Home HH orders placed: N/A Status is: Inpatient  Objective: Blood pressure (!) 94/54, pulse (!) 101, temperature 99.3 F (37.4 C), temperature source Oral, resp. rate (!) 22, height 5' 2 (1.575 m), weight 76.1 kg, SpO2 92%.  Examination:  Physical Exam Constitutional:      General: She is not in acute distress.    Appearance: Normal appearance.  HENT:     Head: Normocephalic and atraumatic.     Mouth/Throat:     Mouth: Mucous membranes are moist.  Eyes:     Extraocular Movements: Extraocular movements intact.  Cardiovascular:     Rate and Rhythm: Normal rate and regular rhythm.  Pulmonary:     Effort: Pulmonary effort is normal. No respiratory distress.     Breath sounds: No wheezing.     Comments: Diffuse coarse breath sounds bilaterally Abdominal:     General: Bowel sounds are normal. There is no distension.     Palpations: Abdomen is soft.     Tenderness: There is no abdominal tenderness.  Musculoskeletal:        General: Normal range of motion.     Cervical back: Normal range of motion and neck supple.  Skin:    General: Skin is warm and dry.  Neurological:      General: No focal deficit present.     Mental Status: She is alert.  Psychiatric:        Mood and Affect: Mood normal.      Consultants:  Pulmonology - 9/14 ID - 9/16  Procedures:    Data Reviewed: Results for orders placed or performed during the hospital encounter of 03/08/24 (from the past 24 hours)  Glucose, capillary     Status: Abnormal   Collection Time: 03/10/24  4:02 PM  Result Value Ref Range   Glucose-Capillary 156 (H) 70 - 99 mg/dL   Comment 1 Notify RN    Comment 2 Document in Chart   Glucose, capillary     Status: Abnormal   Collection Time: 03/10/24  9:37 PM  Result Value Ref Range   Glucose-Capillary 203 (H) 70 - 99 mg/dL   Comment 1 Notify RN    Comment 2 Document in Chart   CBC with Differential/Platelet     Status: Abnormal   Collection Time: 03/11/24  4:30 AM  Result Value Ref Range   WBC 7.2 4.0 - 10.5 K/uL   RBC 2.70 (L) 3.87 - 5.11 MIL/uL   Hemoglobin 8.7 (L) 12.0 - 15.0 g/dL   HCT 71.7 (L) 63.9 - 53.9 %   MCV 104.4 (H) 80.0 - 100.0 fL   MCH 32.2 26.0 - 34.0 pg   MCHC 30.9 30.0 - 36.0 g/dL   RDW 85.5 88.4 - 84.4 %   Platelets 103 (L) 150 - 400 K/uL   nRBC 0.0 0.0 - 0.2 %   Neutrophils Relative % 71 %   Neutro Abs 5.1 1.7 - 7.7 K/uL   Lymphocytes Relative 14 %   Lymphs Abs 1.0 0.7 - 4.0 K/uL   Monocytes Relative 8 %   Monocytes Absolute 0.6 0.1 - 1.0 K/uL   Eosinophils Relative 0 %   Eosinophils Absolute 0.0 0.0 - 0.5 K/uL   Basophils Relative 0 %   Basophils Absolute 0.0 0.0 - 0.1 K/uL   RBC Morphology MORPHOLOGY UNREMARKABLE    Smear Review Normal platelet morphology    Immature Granulocytes 7 %   Abs Immature Granulocytes 0.52 (H) 0.00 - 0.07 K/uL   Hypersegmented Neutrophils PRESENT   Magnesium      Status: None   Collection Time: 03/11/24  4:30 AM  Result Value Ref Range   Magnesium  2.1 1.7 - 2.4 mg/dL  Basic metabolic panel with GFR     Status: Abnormal   Collection Time: 03/11/24  4:30 AM  Result Value Ref Range   Sodium 143  135 - 145 mmol/L   Potassium 3.2 (L) 3.5 - 5.1 mmol/L   Chloride 105 98 - 111 mmol/L   CO2 27 22 - 32 mmol/L   Glucose, Bld 153 (H) 70 - 99 mg/dL   BUN 20 8 - 23 mg/dL   Creatinine, Ser 9.30 0.44 -  1.00 mg/dL   Calcium  8.7 (L) 8.9 - 10.3 mg/dL   GFR, Estimated >39 >39 mL/min   Anion gap 11 5 - 15  Glucose, capillary     Status: Abnormal   Collection Time: 03/11/24  7:51 AM  Result Value Ref Range   Glucose-Capillary 147 (H) 70 - 99 mg/dL   Comment 1 Notify RN    Comment 2 Document in Chart   Glucose, capillary     Status: Abnormal   Collection Time: 03/11/24 11:37 AM  Result Value Ref Range   Glucose-Capillary 139 (H) 70 - 99 mg/dL   Comment 1 Notify RN    Comment 2 Document in Chart    *Note: Due to a large number of results and/or encounters for the requested time period, some results have not been displayed. A complete set of results can be found in Results Review.    I have reviewed pertinent nursing notes, vitals, labs, and images as necessary. I have ordered labwork to follow up on as indicated.  I have reviewed the last notes from staff over past 24 hours. I have discussed patient's care plan and test results with nursing staff, CM/SW, and other staff as appropriate.  Time spent: Greater than 50% of the 55 minute visit was spent in counseling/coordination of care for the patient as laid out in the A&P.   LOS: 3 days   Alm Apo, MD [facility] 03/11/2024, 1:59 PM

## 2024-03-11 NOTE — Progress Notes (Signed)
  Echocardiogram 2D Echocardiogram has been performed.  Koleen KANDICE Popper, RDCS 03/11/2024, 8:53 AM

## 2024-03-12 ENCOUNTER — Encounter (HOSPITAL_COMMUNITY): Payer: Self-pay | Admitting: Internal Medicine

## 2024-03-12 DIAGNOSIS — I10 Essential (primary) hypertension: Secondary | ICD-10-CM

## 2024-03-12 DIAGNOSIS — I5021 Acute systolic (congestive) heart failure: Secondary | ICD-10-CM | POA: Diagnosis not present

## 2024-03-12 DIAGNOSIS — I4719 Other supraventricular tachycardia: Secondary | ICD-10-CM

## 2024-03-12 DIAGNOSIS — I340 Nonrheumatic mitral (valve) insufficiency: Secondary | ICD-10-CM | POA: Diagnosis not present

## 2024-03-12 DIAGNOSIS — C9000 Multiple myeloma not having achieved remission: Secondary | ICD-10-CM | POA: Diagnosis not present

## 2024-03-12 DIAGNOSIS — I5041 Acute combined systolic (congestive) and diastolic (congestive) heart failure: Secondary | ICD-10-CM | POA: Diagnosis not present

## 2024-03-12 DIAGNOSIS — R6520 Severe sepsis without septic shock: Secondary | ICD-10-CM | POA: Diagnosis not present

## 2024-03-12 DIAGNOSIS — J9601 Acute respiratory failure with hypoxia: Secondary | ICD-10-CM | POA: Diagnosis not present

## 2024-03-12 DIAGNOSIS — A419 Sepsis, unspecified organism: Secondary | ICD-10-CM | POA: Diagnosis not present

## 2024-03-12 LAB — BASIC METABOLIC PANEL WITH GFR
Anion gap: 12 (ref 5–15)
BUN: 21 mg/dL (ref 8–23)
CO2: 26 mmol/L (ref 22–32)
Calcium: 8.8 mg/dL — ABNORMAL LOW (ref 8.9–10.3)
Chloride: 106 mmol/L (ref 98–111)
Creatinine, Ser: 0.63 mg/dL (ref 0.44–1.00)
GFR, Estimated: >60 mL/min (ref >60)
Glucose, Bld: 144 mg/dL — ABNORMAL HIGH (ref 70–99)
Potassium: 4.3 mmol/L (ref 3.5–5.1)
Sodium: 144 mmol/L (ref 135–145)

## 2024-03-12 LAB — CBC WITH DIFFERENTIAL/PLATELET
Basophils Absolute: 0 K/uL (ref 0.0–0.1)
Basophils Relative: 0 %
Eosinophils Absolute: 0 K/uL (ref 0.0–0.5)
Eosinophils Relative: 0 %
HCT: 29.7 % — ABNORMAL LOW (ref 36.0–46.0)
Hemoglobin: 8.9 g/dL — ABNORMAL LOW (ref 12.0–15.0)
Lymphocytes Relative: 13 %
Lymphs Abs: 0.7 K/uL (ref 0.7–4.0)
MCH: 31.8 pg (ref 26.0–34.0)
MCHC: 30 g/dL (ref 30.0–36.0)
MCV: 106.1 fL — ABNORMAL HIGH (ref 80.0–100.0)
Monocytes Absolute: 0.1 K/uL (ref 0.1–1.0)
Monocytes Relative: 2 %
Neutro Abs: 4.3 K/uL (ref 1.7–7.7)
Neutrophils Relative %: 85 %
Platelets: 102 K/uL — ABNORMAL LOW (ref 150–400)
RBC: 2.8 MIL/uL — ABNORMAL LOW (ref 3.87–5.11)
RDW: 14.5 % (ref 11.5–15.5)
WBC: 5.1 K/uL (ref 4.0–10.5)
nRBC: 0 % (ref 0.0–0.2)

## 2024-03-12 LAB — LEGIONELLA PNEUMOPHILA SEROGP 1 UR AG: L. pneumophila Serogp 1 Ur Ag: NEGATIVE

## 2024-03-12 LAB — TROPONIN T, HIGH SENSITIVITY
Troponin T High Sensitivity: 669 ng/L (ref 0–19)
Troponin T High Sensitivity: 649 ng/L (ref 0–19)

## 2024-03-12 LAB — GLUCOSE, CAPILLARY
Glucose-Capillary: 151 mg/dL — ABNORMAL HIGH (ref 70–99)
Glucose-Capillary: 111 mg/dL — ABNORMAL HIGH (ref 70–99)
Glucose-Capillary: 230 mg/dL — ABNORMAL HIGH (ref 70–99)
Glucose-Capillary: 108 mg/dL — ABNORMAL HIGH (ref 70–99)

## 2024-03-12 LAB — MAGNESIUM: Magnesium: 2.1 mg/dL (ref 1.7–2.4)

## 2024-03-12 MED ORDER — METOPROLOL SUCCINATE ER 25 MG PO TB24
12.5000 mg | ORAL_TABLET | Freq: Every day | ORAL | Status: DC
  Administered 2024-03-12 – 2024-03-13 (×2): 12.5 mg via ORAL
  Filled 2024-03-12 (×2): qty 1

## 2024-03-12 MED ORDER — POTASSIUM CHLORIDE CRYS ER 20 MEQ PO TBCR
40.0000 meq | EXTENDED_RELEASE_TABLET | Freq: Once | ORAL | Status: AC
  Administered 2024-03-12: 40 meq via ORAL
  Filled 2024-03-12: qty 2

## 2024-03-12 MED ORDER — FUROSEMIDE 10 MG/ML IJ SOLN
80.0000 mg | Freq: Two times a day (BID) | INTRAMUSCULAR | Status: AC
  Administered 2024-03-12 – 2024-03-13 (×3): 80 mg via INTRAVENOUS
  Filled 2024-03-12 (×3): qty 8

## 2024-03-12 MED ORDER — FUROSEMIDE 10 MG/ML IJ SOLN
80.0000 mg | Freq: Two times a day (BID) | INTRAMUSCULAR | Status: DC
Start: 2024-03-12 — End: 2024-03-12

## 2024-03-12 NOTE — Progress Notes (Addendum)
 PROGRESS NOTE  Jean Davidson FMW:[id number] DOB: 10/19/1945 DOA: 03/08/2024 PCP: Patient, [name]   LOS: 4 days   Brief narrative:  Jean Davidson is a 78 year old female with past medical history of relapsed/Refractory IgA Lambda Multiple Myeloma, In Remission, GERD, HTN, macular degeneration, cervical spinal stenosis presented to hospital with shortness of breath.  Patient was recently seen as outpatient for low-grade fever and was presumed to have UTI and received ciprofloxacin .  On this presentation she presented with progressive shortness of breath nonproductive cough and low-grade fever of 101.  In the ED patient was slightly tachycardic and needed supplemental oxygen .  Chest x-ray did not show any infiltrate.  Subsequently CT angiogram of the chest was done which showed negative for PE but bilateral airspace disease consistent with multifocal pneumonia..  Initial labs were notable for WBC at 4.2 with 2% bands,on elranatamab .  COVID, flu, RSV negative.  Procalcitonin was negative.  Patient had elevated lactate on presentation with trended up to 5.3.  Received 3-1/2 L of IV fluids in the ED.  Continue vancomycin  cefepime  and Flagyl .  Given acute respiratory failure patient also required BiPAP due to increased work of breathing and was admitted t for further evaluation and treatment.     Assessment/Plan: Principal Problem:   Severe sepsis (HCC) Active Problems:   Acute hypoxic respiratory failure (HCC)   CAP (community acquired pneumonia)   Multiple myeloma in remission (HCC)   Essential hypertension   Anemia due to antineoplastic chemotherapy   Diarrhea   GERD (gastroesophageal reflux disease)   Peripheral neuropathy due to chemotherapy (HCC)  Severe sepsis likely secondary to multifocal community-acquired pneumonia. Increased risk for multidrug-resistant organism given immunosuppression.  Patient had tachycardia neutropenia lactic acidosis and pulmonary infiltrates suggestive of sepsis  secondary to multifocal pneumonia with fever and hypoxia requiring supplemental oxygen .  Continue vancomycin , cefepime  and Flagyl .  PCCM was consulted and added Zithromax  for 3 days on 915.  ID was consulted on 9/16 after oncology discussed case at the- ID consulted 9/16 after oncology discussed case at the committee.  Patient was noncompliant with atovaquone  at home due to taste (takes every couple days).  Currently on high flow nasal cannula at 25 L/min.   Acute hypoxic respiratory failure secondary to severe multifocal pneumonia.  PE was ruled out.  Was initially on high flow nasal cannula BiPAP.  Continue Solu-Medrol .  PCCM on board.  2D echocardiogram was performed on 03/11/2024 with LV ejection fraction of 35 to 40% with no regional wall motion abnormality and grade 1 diastolic dysfunction.  Currently on 7 L of high flow nasal cannula.  Will continue to wean as able  Acute systolic heart failure   New findings of LV ejection fraction of 35 to 40%.  Cardiology has been consulted.  Follow cardiology recommendation.  Will continue to diurese.  Currently on 80 mg of IV Lasix  twice daily.    Multiple myeloma in remission  Follows up with Dr. Federico oncology as outpatient. - 2016: Underwent autologous stem cell transplant. 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 10/16/2022: Completed CAR-T therapy with [employer]. 09/2023: Started on Elrexfio  due to progression of disease. 01/02/2024: Last visit at [facility] where induction therapy was completed - Now on Elranatamab  (Elrexfio ); just had cycle 6 on 02/27/24 - Continue atovaquone  and acyclovir  Previous provider had spoken with Dr. Federico on 03/11/2024; patient due for IVIG on 03/12/2024.  Okay to wait until outpatient follow-up for infusion   Anemia due to antineoplastic chemotherapy Hemoglobin today  at 8.9.  No evidence of bleeding.  Will continue to monitor.   Essential hypertension Oral antihypertensives on hold.  As needed  antihypertensives at this time.   Diarrhea. History of chronic diarrhea.  Was recently on ciprofloxacin .  As needed Imodium  Lomotil .    GERD (gastroesophageal reflux disease) - Continue Protonix   Debility weakness.  Will get PT OT evaluation.  DVT prophylaxis: SCDs Start: 03/08/24 1347   Disposition: Home with home health likely in 1 to 2 days.  Get PT evaluation.  Status is: Inpatient Remains inpatient appropriate because: Pending clinical improvement, IV diuresis, cardiology evaluation, IV fluid nasal cannula oxygen .    Code Status:     Code Status: Do not attempt resuscitation (DNR) PRE-ARREST INTERVENTIONS DESIRED  Family Communication: None at bedside  Consultants: PCCM Cardiology  Procedures: None  Anti-infectives:  Acyclovir  atovaquone  metronidazole  cefepime  and azithromycin   Anti-infectives (From admission, onward)    Start     Dose/Rate Route Frequency Ordered Stop   03/10/24 1100  azithromycin  (ZITHROMAX ) 500 mg in sodium chloride  0.9 % 250 mL IVPB        500 mg 250 mL/hr over 60 Minutes Intravenous Every 24 hours 03/10/24 0928 03/12/24 1120   03/09/24 1200  vancomycin  (VANCOCIN ) IVPB 1000 mg/200 mL premix  Status:  Discontinued        1,000 mg 200 mL/hr over 60 Minutes Intravenous Every 24 hours 03/08/24 1554 03/10/24 2102   03/09/24 0000  ceFEPIme  (MAXIPIME ) 2 g in sodium chloride  0.9 % 100 mL IVPB        2 g 200 mL/hr over 30 Minutes Intravenous Every 12 hours 03/08/24 1554 03/12/24 1233   03/08/24 2200  metroNIDAZOLE  (FLAGYL ) IVPB 500 mg        500 mg 100 mL/hr over 60 Minutes Intravenous Every 12 hours 03/08/24 1523 03/12/24 2359   03/08/24 2200  acyclovir  (ZOVIRAX ) tablet 400 mg        400 mg Oral 2 times daily 03/08/24 1643     03/08/24 2200  atovaquone  (MEPRON ) 750 MG/5ML suspension 1,500 mg        1,500 mg Oral 2 times daily 03/08/24 1846     03/08/24 1115  ceFEPIme  (MAXIPIME ) 2 g in sodium chloride  0.9 % 100 mL IVPB        2 g 200 mL/hr over  30 Minutes Intravenous  Once 03/08/24 1112 03/08/24 1408   03/08/24 1115  metroNIDAZOLE  (FLAGYL ) IVPB 500 mg        500 mg 100 mL/hr over 60 Minutes Intravenous  Once 03/08/24 1112 03/08/24 1302   03/08/24 1115  vancomycin  (VANCOCIN ) IVPB 1000 mg/200 mL premix        1,000 mg 200 mL/hr over 60 Minutes Intravenous  Once 03/08/24 1112 03/08/24 1533       Subjective: Today, patient was seen and examined at bedside.  Patient states that she does have some cough and diarrhea.  Denies any chest pain fever chills nausea vomiting.  Appears weak and deconditioned.  Objective: Vitals:   03/12/24 1246 03/12/24 1250  BP:  112/80  Pulse: 94 94  Resp: 15   Temp:    SpO2: 98%     Intake/Output Summary (Last 24 hours) at 03/12/2024 1312 Last data filed at 03/12/2024 1246 Gross per 24 hour  Intake 996.69 ml  Output 1950 ml  Net -953.31 ml   Filed Weights   03/08/24 1106 03/11/24 1325  Weight: 72.6 kg 76.1 kg   Body mass index is 30.69 kg/m.  Physical Exam: GENERAL: Patient is alert awake and oriented. Not in obvious distress.,  On high flow nasal cannula oxygen , Communicative. HENT: No scleral pallor or icterus. Pupils equally reactive to light. Oral mucosa is moist NECK: is supple, no gross swelling noted. CHEST: Decreased breath sounds bilaterally.  Coarse breath sounds noted with inspiratory crackles. CVS: S1 and S2 heard, no murmur. Regular rate and rhythm.  ABDOMEN: Soft, non-tender, bowel sounds are present. EXTREMITIES: Trace bilateral lower extremity edema CNS: Cranial nerves are intact. No focal motor deficits. SKIN: warm and dry without rashes.  Data Review: I have personally reviewed the following laboratory data and studies,  CBC: Recent Labs  Lab 03/08/24 1138 03/08/24 1216 03/09/24 0523 03/10/24 0258 03/11/24 0430 03/12/24 0308  WBC 4.2  --  5.4 6.2 7.2 5.1  NEUTROABS 1.8  --  3.2 4.9 5.1 4.3  HGB 11.3* 11.2* 9.6* 8.7* 8.7* 8.9*  HCT 36.0 33.0* 31.9* 27.5*  28.2* 29.7*  MCV 102.0*  --  105.3* 105.0* 104.4* 106.1*  PLT 112*  --  105* 97* 103* 102*   Basic Metabolic Panel: Recent Labs  Lab 03/09/24 0523 03/10/24 0258 03/11/24 0430 03/11/24 1333 03/12/24 0308  NA 140 139 143 141 144  K 3.9 3.8 3.2* 3.2* 4.3  CL 104 106 105 104 106  CO2 22 24 27 24 26   GLUCOSE 115* 178* 153* 186* 144*  BUN 7* 12 20 22 21   CREATININE 0.60 0.57 0.69 0.71 0.63  CALCIUM  8.4* 8.8* 8.7* 8.9 8.8*  MG 1.5* 2.3 2.1 2.0 2.1   Liver Function Tests: Recent Labs  Lab 03/08/24 1138  AST 41  ALT 12  ALKPHOS 99  BILITOT 0.3  PROT 6.4*  ALBUMIN 4.1   No results for input(s): LIPASE, AMYLASE in the last 168 hours. No results for input(s): AMMONIA in the last 168 hours. Cardiac Enzymes: No results for input(s): CKTOTAL, CKMB, CKMBINDEX, TROPONINI in the last 168 hours. BNP (last 3 results) No results for input(s): BNP in the last 8760 hours.  ProBNP (last 3 results) Recent Labs    03/10/24 0258  PROBNP 16,972.0*    CBG: Recent Labs  Lab 03/11/24 1137 03/11/24 1705 03/11/24 2129 03/12/24 0728 03/12/24 1154  GLUCAP 139* 152* 269* 151* 111*   Recent Results (from the past 240 hours)  Culture, blood (Routine X 2) w Reflex to ID Panel     Status: None   Collection Time: 03/05/24  9:41 AM   Specimen: BLOOD RIGHT HAND  Result Value Ref Range Status   Specimen Description   Final    BLOOD RIGHT HAND Performed at [facility], [address], [place], [place] [address]    Special Requests   Final    NONE Performed at [facility], [address] [place], [place] [address]    Culture   Final    NO GROWTH 5 DAYS Performed at [facility], [address], [place], [place] [address]    Report Status 03/10/2024 FINAL  Final  Resp panel by RT-PCR (RSV, Flu A&B, Covid) Anterior Nasal Swab     Status: None   Collection Time: 03/05/24 10:10 AM   Specimen: Anterior Nasal Swab   Result Value Ref Range Status   SARS Coronavirus 2 by RT PCR NEGATIVE NEGATIVE Final    Comment: (NOTE) SARS-CoV-2 target nucleic acids are NOT DETECTED.  The SARS-CoV-2 RNA is generally detectable in upper respiratory specimens during the acute phase of infection.  The lowest concentration of SARS-CoV-2 viral copies this assay can detect is 138 copies/mL. A negative result does not preclude SARS-Cov-2 infection and should not be used as the sole basis for treatment or other patient management decisions. A negative result may occur with  improper specimen collection/handling, submission of specimen other than nasopharyngeal swab, presence of viral mutation(s) within the areas targeted by this assay, and inadequate number of viral copies(<138 copies/mL). A negative result must be combined with clinical observations, patient history, and epidemiological information. The expected result is Negative.  Fact Sheet for Patients:  BloggerCourse.com  Fact Sheet for Healthcare Providers:  SeriousBroker.it  This test is no t yet approved or cleared by the [place]  FDA and  has been authorized for detection and/or diagnosis of SARS-CoV-2 by FDA under an Emergency Use Authorization (EUA). This EUA will remain  in effect (meaning this test can be used) for the duration of the COVID-19 declaration under Section 564(b)(1) of the Act, 21 U.S.C.section 360bbb-3(b)(1), unless the authorization is terminated  or revoked sooner.       Influenza A by PCR NEGATIVE NEGATIVE Final   Influenza B by PCR NEGATIVE NEGATIVE Final    Comment: (NOTE) The Xpert Xpress SARS-CoV-2/FLU/RSV plus assay is intended as an aid in the diagnosis of influenza from Nasopharyngeal swab specimens and should not be used as a sole basis for treatment. Nasal washings and aspirates are unacceptable for Xpert Xpress SARS-CoV-2/FLU/RSV testing.  Fact Sheet for  Patients: BloggerCourse.com  Fact Sheet for Healthcare Providers: SeriousBroker.it  This test is not yet approved or cleared by the [place]  FDA and has been authorized for detection and/or diagnosis of SARS-CoV-2 by FDA under an Emergency Use Authorization (EUA). This EUA will remain in effect (meaning this test can be used) for the duration of the COVID-19 declaration under Section 564(b)(1) of the Act, 21 U.S.C. section 360bbb-3(b)(1), unless the authorization is terminated or revoked.     Resp Syncytial Virus by PCR NEGATIVE NEGATIVE Final    Comment: (NOTE) Fact Sheet for Patients: BloggerCourse.com  Fact Sheet for Healthcare Providers: SeriousBroker.it  This test is not yet approved or cleared by the [place]  FDA and has been authorized for detection and/or diagnosis of SARS-CoV-2 by FDA under an Emergency Use Authorization (EUA). This EUA will remain in effect (meaning this test can be used) for the duration of the COVID-19 declaration under Section 564(b)(1) of the Act, 21 U.S.C. section 360bbb-3(b)(1), unless the authorization is terminated or revoked.  Performed at [facility], [address] [place], [place] [address]   Culture, blood (Routine X 2) w Reflex to ID Panel     Status: None   Collection Time: 03/05/24 10:21 AM   Specimen: BLOOD LEFT HAND  Result Value Ref Range Status   Specimen Description   Final    BLOOD LEFT HAND Performed at [facility], [address], [place], [place] [address]    Special Requests   Final    NONE Performed at [facility], [address], [place], [place] [address]    Culture   Final    NO GROWTH 5 DAYS Performed at [facility], [address], [place], [place] [address]    Report Status 03/10/2024 FINAL  Final  Urine Culture      Status: Abnormal   Collection Time: 03/05/24  3:12 PM   Specimen: Urine, Clean Catch  Result Value Ref Range Status   Specimen  Description   Final    URINE, CLEAN CATCH Performed at [facility], [address], [place], [place] [address]    Special Requests   Final    NONE Performed at [facility], [address], [place], [place] [address]    Culture MULTIPLE SPECIES PRESENT, SUGGEST RECOLLECTION (A)  Final   Report Status 03/06/2024 FINAL  Final  Resp panel by RT-PCR (RSV, Flu A&B, Covid) Anterior Nasal Swab     Status: None   Collection Time: 03/08/24 11:38 AM   Specimen: Anterior Nasal Swab  Result Value Ref Range Status   SARS Coronavirus 2 by RT PCR NEGATIVE NEGATIVE Final    Comment: (NOTE) SARS-CoV-2 target nucleic acids are NOT DETECTED.  The SARS-CoV-2 RNA is generally detectable in upper respiratory specimens during the acute phase of infection. The lowest concentration of SARS-CoV-2 viral copies this assay can detect is 138 copies/mL. A negative result does not preclude SARS-Cov-2 infection and should not be used as the sole basis for treatment or other patient management decisions. A negative result may occur with  improper specimen collection/handling, submission of specimen other than nasopharyngeal swab, presence of viral mutation(s) within the areas targeted by this assay, and inadequate number of viral copies(<138 copies/mL). A negative result must be combined with clinical observations, patient history, and epidemiological information. The expected result is Negative.  Fact Sheet for Patients:  BloggerCourse.com  Fact Sheet for Healthcare Providers:  SeriousBroker.it  This test is no t yet approved or cleared by the [place]  FDA and  has been authorized for detection and/or diagnosis of SARS-CoV-2 by FDA under an Emergency Use Authorization (EUA).  This EUA will remain  in effect (meaning this test can be used) for the duration of the COVID-19 declaration under Section 564(b)(1) of the Act, 21 U.S.C.section 360bbb-3(b)(1), unless the authorization is terminated  or revoked sooner.       Influenza A by PCR NEGATIVE NEGATIVE Final   Influenza B by PCR NEGATIVE NEGATIVE Final    Comment: (NOTE) The Xpert Xpress SARS-CoV-2/FLU/RSV plus assay is intended as an aid in the diagnosis of influenza from Nasopharyngeal swab specimens and should not be used as a sole basis for treatment. Nasal washings and aspirates are unacceptable for Xpert Xpress SARS-CoV-2/FLU/RSV testing.  Fact Sheet for Patients: BloggerCourse.com  Fact Sheet for Healthcare Providers: SeriousBroker.it  This test is not yet approved or cleared by the [place]  FDA and has been authorized for detection and/or diagnosis of SARS-CoV-2 by FDA under an Emergency Use Authorization (EUA). This EUA will remain in effect (meaning this test can be used) for the duration of the COVID-19 declaration under Section 564(b)(1) of the Act, 21 U.S.C. section 360bbb-3(b)(1), unless the authorization is terminated or revoked.     Resp Syncytial Virus by PCR NEGATIVE NEGATIVE Final    Comment: (NOTE) Fact Sheet for Patients: BloggerCourse.com  Fact Sheet for Healthcare Providers: SeriousBroker.it  This test is not yet approved or cleared by the [place]  FDA and has been authorized for detection and/or diagnosis of SARS-CoV-2 by FDA under an Emergency Use Authorization (EUA). This EUA will remain in effect (meaning this test can be used) for the duration of the COVID-19 declaration under Section 564(b)(1) of the Act, 21 U.S.C. section 360bbb-3(b)(1), unless the authorization is terminated or revoked.  Performed at [facility], [address], [place], [place] [address]   Respiratory (~20 pathogens) panel by PCR  Status: None   Collection Time: 03/08/24 11:38 AM   Specimen: Nasopharyngeal Swab; Respiratory  Result Value Ref Range Status   Adenovirus NOT DETECTED NOT DETECTED Final   Coronavirus 229E NOT DETECTED NOT DETECTED Final    Comment: (NOTE) The Coronavirus on the Respiratory Panel, DOES NOT test for the novel  Coronavirus (2019 nCoV)    Coronavirus HKU1 NOT DETECTED NOT DETECTED Final   Coronavirus NL63 NOT DETECTED NOT DETECTED Final   Coronavirus OC43 NOT DETECTED NOT DETECTED Final   Metapneumovirus NOT DETECTED NOT DETECTED Final   Rhinovirus / Enterovirus NOT DETECTED NOT DETECTED Final   Influenza A NOT DETECTED NOT DETECTED Final   Influenza B NOT DETECTED NOT DETECTED Final   Parainfluenza Virus 1 NOT DETECTED NOT DETECTED Final   Parainfluenza Virus 2 NOT DETECTED NOT DETECTED Final   Parainfluenza Virus 3 NOT DETECTED NOT DETECTED Final   Parainfluenza Virus 4 NOT DETECTED NOT DETECTED Final   Respiratory Syncytial Virus NOT DETECTED NOT DETECTED Final   Bordetella pertussis NOT DETECTED NOT DETECTED Final   Bordetella Parapertussis NOT DETECTED NOT DETECTED Final   Chlamydophila pneumoniae NOT DETECTED NOT DETECTED Final   Mycoplasma pneumoniae NOT DETECTED NOT DETECTED Final    Comment: Performed at [facility], [address] [place], [place] [address]  Blood Culture (routine x 2)     Status: None (Preliminary result)   Collection Time: 03/08/24  1:34 PM   Specimen: BLOOD LEFT ARM  Result Value Ref Range Status   Specimen Description   Final    BLOOD LEFT ARM Performed at [facility], [address], [place], [place] [address]    Special Requests   Final    BOTTLES DRAWN AEROBIC ONLY Blood Culture results may not be optimal due to an inadequate volume of blood received in culture bottles Performed at [facility], [address], [place], [place] [address]     Culture   Final    NO GROWTH 4 DAYS Performed at [facility], [address], [place], [place] [address]    Report Status PENDING  Incomplete  Blood Culture (routine x 2)     Status: None (Preliminary result)   Collection Time: 03/08/24  1:58 PM   Specimen: BLOOD LEFT ARM  Result Value Ref Range Status   Specimen Description   Final    BLOOD LEFT ARM Performed at [facility], [address], [place], [place] [address]    Special Requests   Final    BOTTLES DRAWN AEROBIC AND ANAEROBIC Blood Culture adequate volume Performed at [facility], [address] [place], [place] [address]    Culture   Final    NO GROWTH 4 DAYS Performed at [facility], [address], [place], [place] [address]    Report Status PENDING  Incomplete  MRSA Next Gen by PCR, Nasal     Status: None   Collection Time: 03/10/24 10:53 AM   Specimen: Nasal Mucosa; Nasal Swab  Result Value Ref Range Status   MRSA by PCR Next Gen NOT DETECTED NOT DETECTED Final    Comment: (NOTE) The GeneXpert MRSA Assay (FDA approved for NASAL specimens only), is one component of a comprehensive MRSA colonization surveillance program. It is not intended to diagnose MRSA infection nor to guide or monitor treatment for MRSA infections. Test performance is not FDA approved in patients less than 10 years old. Performed at [facility], [address] [place], [place] [address]      Studies: ECHOCARDIOGRAM COMPLETE Result Date: 03/11/2024    ECHOCARDIOGRAM REPORT   Patient Name:   Jean Davidson Date of Exam: 03/11/2024 Medical Rec #:  [id number]      Height:       62.0 in Accession #:    [id number]     Weight:       160.0 lb Date of Birth:  [birth year]      BSA:          1.739 m Patient Age:    77 years       BP:           94/54 mmHg Patient Gender: F              HR:           90 bpm. Exam Location:  Inpatient Procedure: 2D Echo, Intracardiac Opacification Agent, Cardiac  Doppler and Color            Doppler (Both Spectral and Color Flow Doppler were utilized during            procedure). Indications:    CHF-Acute Diastolic I50.31  History:        Patient has prior history of Echocardiogram examinations, most                 recent 04/20/2021. Risk Factors:Hypertension and Former Smoker.  Sonographer:    Koleen Popper RDCS Referring Phys: [id number] PAULA SOUTHERLY IMPRESSIONS  1. Left ventricular ejection fraction, by estimation, is 35 to 40%. The left ventricle has moderately decreased function. The left ventricle has no regional wall motion abnormalities. Left ventricular diastolic parameters are consistent with Grade I diastolic dysfunction (impaired relaxation).  2. Right ventricular systolic function is normal. The right ventricular size is normal. There is mildly elevated pulmonary artery systolic pressure.  3. The mitral valve is normal in structure. Moderate mitral valve regurgitation. No evidence of mitral stenosis.  4. The aortic valve is normal in structure. Aortic valve regurgitation is not visualized. No aortic stenosis is present.  5. The inferior vena cava is normal in size with greater than 50% respiratory variability, suggesting right atrial pressure of 3 mmHg. FINDINGS  Left Ventricle: Left ventricular ejection fraction, by estimation, is 35 to 40%. The left ventricle has moderately decreased function. The left ventricle has no regional wall motion abnormalities. Definity  contrast agent was given IV to delineate the left ventricular endocardial borders. The left ventricular internal cavity size was normal in size. There is no left ventricular hypertrophy. Left ventricular diastolic parameters are consistent with Grade I diastolic dysfunction (impaired relaxation).  LV Wall Scoring: The apex is akinetic. The apical lateral segment, mid inferoseptal segment, apical septal segment, apical anterior segment, and apical inferior segment are hypokinetic. Right Ventricle: The  right ventricular size is normal. No increase in right ventricular wall thickness. Right ventricular systolic function is normal. There is mildly elevated pulmonary artery systolic pressure. The tricuspid regurgitant velocity is 3.03  m/s, and with an assumed right atrial pressure of 8 mmHg, the estimated right ventricular systolic pressure is 44.7 mmHg. Left Atrium: Left atrial size was normal in size. Right Atrium: Right atrial size was normal in size. Pericardium: There is no evidence of pericardial effusion. Mitral Valve: The mitral valve is normal in structure. Mild mitral annular calcification. Moderate mitral valve regurgitation. No evidence of mitral valve stenosis. MV peak gradient, 6.2 mmHg. The mean mitral valve  gradient is 3.0 mmHg. Tricuspid Valve: The tricuspid valve is normal in structure. Tricuspid valve regurgitation is mild . No evidence of tricuspid stenosis. Aortic Valve: The aortic valve is normal in structure. Aortic valve regurgitation is not visualized. No aortic stenosis is present. Pulmonic Valve: The pulmonic valve was normal in structure. Pulmonic valve regurgitation is trivial. No evidence of pulmonic stenosis. Aorta: The aortic root is normal in size and structure. Venous: The inferior vena cava is normal in size with greater than 50% respiratory variability, suggesting right atrial pressure of 3 mmHg. IAS/Shunts: No atrial level shunt detected by color flow Doppler.  LEFT VENTRICLE PLAX 2D LVIDd:         4.20 cm   Diastology LVIDs:         3.10 cm   LV e' medial:    6.53 cm/s LV PW:         0.80 cm   LV E/e' medial:  15.2 LV IVS:        1.10 cm   LV e' lateral:   6.74 cm/s LVOT diam:     1.80 cm   LV E/e' lateral: 14.8 LV SV:         45 LV SV Index:   26 LVOT Area:     2.54 cm  RIGHT VENTRICLE             IVC RV S prime:     15.20 cm/s  IVC diam: 2.40 cm TAPSE (M-mode): 2.4 cm LEFT ATRIUM           Index        RIGHT ATRIUM           Index LA diam:      4.00 cm 2.30 cm/m   RA Area:      10.90 cm LA Vol (A2C): 36.0 ml 20.70 ml/m  RA Volume:   24.60 ml  14.15 ml/m LA Vol (A4C): 52.6 ml 30.25 ml/m  AORTIC VALVE LVOT Vmax:   102.00 cm/s LVOT Vmean:  70.600 cm/s LVOT VTI:    0.176 m  AORTA Ao Root diam: 2.80 cm Ao Asc diam:  3.70 cm MITRAL VALVE                  TRICUSPID VALVE MV Area (PHT): 7.16 cm       TR Peak grad:   36.7 mmHg MV Area VTI:   1.89 cm       TR Vmax:        303.00 cm/s MV Peak grad:  6.2 mmHg MV Mean grad:  3.0 mmHg       SHUNTS MV Vmax:       1.25 m/s       Systemic VTI:  0.18 m MV Vmean:      81.2 cm/s      Systemic Diam: 1.80 cm MV Decel Time: 106 msec MR Peak grad:    108.8 mmHg MR Mean grad:    79.0 mmHg MR Vmax:         521.50 cm/s MR Vmean:        427.0 cm/s MR PISA:         1.01 cm MR PISA Eff ROA: 7 mm MR PISA Radius:  0.40 cm MV E velocity: 99.50 cm/s MV A velocity: 78.80 cm/s MV E/A ratio:  1.26 Aditya Sabharwal Electronically signed by Ria Commander Signature Date/Time: 03/11/2024/5:59:16 PM    Final       Vernal Alstrom, MD  [facility] 03/12/2024  If 7PM-7AM, please contact night-coverage

## 2024-03-12 NOTE — Progress Notes (Signed)
 NAME:  Jean Davidson, MRN:  [id number], DOB:  08/09/1945, LOS: 4 ADMISSION DATE:  03/08/2024, CONSULTATION DATE:  03/11/2024 REFERRING MD:  MELODIE, CHIEF COMPLAINT:  Respiratory Failure   History of Present Illness:  Jean Davidson is a 78 y/o F with relapsted/refractory multiple myeloma (in remission), GERD, HTN, and cervical stenosis who presents for acute hypoxic respiratory failure due to multifocal pneumonia and acute diastolic heart failure. Pulmonology consulted for evaluation and management of the latter  Pertinent  Medical History  As above  Significant Hospital Events: Including procedures, antibiotic start and stop dates in addition to other pertinent events   Admitted on 03/08/2024, started on cefepime , vanc, metronidazole , needed BiPAP but currently on HFNC BNP elevated to 16 K on 9/15, started diuresis 9/17 incr lasix  weaning O2  Interim History / Subjective:  Reports feeling improved vs yesterday   Objective    Blood pressure 135/73, pulse 95, temperature 98.9 F (37.2 C), temperature source Oral, resp. rate (!) 28, height 5' 2 (1.575 m), weight 76.1 kg, SpO2 100%.    FiO2 (%):  [40 %-50 %] 40 %   Intake/Output Summary (Last 24 hours) at 03/12/2024 0825 Last data filed at 03/12/2024 0551 Gross per 24 hour  Intake 1141.76 ml  Output 1450 ml  Net -308.24 ml   Filed Weights   03/08/24 1106 03/11/24 1325  Weight: 72.6 kg 76.1 kg    Examination: General: wdwn older adult F NAD  HENT: NCAT pink mm  Lungs: even and unlabored. Basilar crackles  Cardiovascular: cap refill < 3 sec rrr Abdomen: soft  Extremities: no obvious acute joint deformity  Neuro: AAOx3  GU: defer   Resolved problem list   Assessment and Plan   Acute hypoxic resp failure Pulmonary edema -- Acute HFrEF, diastolic HF  Possible multifocal CAP  -viral panel neg, PCT < 0.1 suggesting against bacterial process, proBNP almost 17K  , ECHO w new LVEF 35-40%  P -incr lasix  80 BID, giving K sup as  well + AM BMP  -5d abx  -dc steroids  -cards consult for new HFrEF  -wean O2 as able -- goal >92%  -PT/OT, pulm hygiene  -TOC consult already placed and is following    Labs   CBC: Recent Labs  Lab 03/08/24 1138 03/08/24 1216 03/09/24 0523 03/10/24 0258 03/11/24 0430 03/12/24 0308  WBC 4.2  --  5.4 6.2 7.2 5.1  NEUTROABS 1.8  --  3.2 4.9 5.1 4.3  HGB 11.3* 11.2* 9.6* 8.7* 8.7* 8.9*  HCT 36.0 33.0* 31.9* 27.5* 28.2* 29.7*  MCV 102.0*  --  105.3* 105.0* 104.4* 106.1*  PLT 112*  --  105* 97* 103* 102*    Basic Metabolic Panel: Recent Labs  Lab 03/09/24 0523 03/10/24 0258 03/11/24 0430 03/11/24 1333 03/12/24 0308  NA 140 139 143 141 144  K 3.9 3.8 3.2* 3.2* 4.3  CL 104 106 105 104 106  CO2 22 24 27 24 26   GLUCOSE 115* 178* 153* 186* 144*  BUN 7* 12 20 22 21   CREATININE 0.60 0.57 0.69 0.71 0.63  CALCIUM  8.4* 8.8* 8.7* 8.9 8.8*  MG 1.5* 2.3 2.1 2.0 2.1   GFR: Estimated Creatinine Clearance: 56.2 mL/min (by C-G formula based on SCr of 0.63 mg/dL). Recent Labs  Lab 03/08/24 1407 03/08/24 1424 03/08/24 1728 03/08/24 1855 03/09/24 0523 03/09/24 0620 03/10/24 0258 03/11/24 0430 03/12/24 0308  PROCALCITON <0.10  --   --   --  <0.10  --   --   --   --  WBC  --   --   --   --  5.4  --  6.2 7.2 5.1  LATICACIDVEN  --  5.3* 1.8 3.0*  --  2.2*  --   --   --     Liver Function Tests: Recent Labs  Lab 03/08/24 1138  AST 41  ALT 12  ALKPHOS 99  BILITOT 0.3  PROT 6.4*  ALBUMIN 4.1   No results for input(s): LIPASE, AMYLASE in the last 168 hours. No results for input(s): AMMONIA in the last 168 hours.  ABG    Component Value Date/Time   TCO2 25 03/08/2024 1216     Coagulation Profile: Recent Labs  Lab 03/08/24 1340  INR 1.1    Cardiac Enzymes: No results for input(s): CKTOTAL, CKMB, CKMBINDEX, TROPONINI in the last 168 hours.  HbA1C: Hgb A1c MFr Bld  Date/Time Value Ref Range Status  03/10/2024 02:58 AM 4.7 (L) 4.8 - 5.6 % Final     Comment:    (NOTE) Diagnosis of Diabetes The following HbA1c ranges recommended by the American Diabetes Association (ADA) may be used as an aid in the diagnosis of diabetes mellitus.  Hemoglobin             Suggested A1C NGSP%              Diagnosis  <5.7                   Non Diabetic  5.7-6.4                Pre-Diabetic  >6.4                   Diabetic  <7.0                   Glycemic control for                       adults with diabetes.    08/08/2013 11:27 AM 5.2 4.6 - 6.5 % Final    Comment:    Glycemic Control Guidelines for People with Diabetes:Non Diabetic:  <6%Goal of Therapy: <7%Additional Action Suggested:  >8%     CBG: Recent Labs  Lab 03/11/24 0751 03/11/24 1137 03/11/24 1705 03/11/24 2129 03/12/24 0728  GLUCAP 147* 139* 152* 269* 151*    CCT n/a   Ronnald Gave MSN, AGACNP-BC [facility] Amion for pager  03/12/2024, 8:25 AM

## 2024-03-12 NOTE — Progress Notes (Addendum)
 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)   Diarrhea   Essential hypertension   Peripheral neuropathy due to chemotherapy (HCC)   Anemia due to antineoplastic chemotherapy   Acute hypoxic respiratory failure (HCC)   CAP (community acquired pneumonia)  Antibiotics:  Cefepime  9/13- Metronidazole  9/13 Vancomycin  9/13-9/15   Acyclovir  and atovaquone  ppx    Lines/Hardware:rt reverse shoulder arthroplasty   Interval Events: Remains afebrile Labs remarkable for Hb 8.9, platelets 102  Assessment 67 Y O female with prior h/o as below including 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 She started getting more SOB with cough and URI symptoms on day of arrival.   # Acute hypoxic respiratory failure - Possible due to common bacterial pathogens, fluid overload - She reports compliance with acyclovir  but missing approximately 2 days of atovaquone  in a week - She has been clinical improving with improved cough as well as oxygen  down to L /min oxygen .  - Pulmonology following, on diuresis, steroids discontinued   # Acute systolic CHF - Per  Cardiology   # MM  - Per oncology, due for IVIG on 9/17 - maintenance  chemotherapy with Elrexfio  (Last dose cycle 6 on 02/27/24) - Continue atovaquone  and acyclovir    Comments - Respiratory failure seems to be more related to fluid overload secondary to acute CHF than pneumonia   Recommendations - complete 5 days of azithromycin  - continue IV cefepime  and metronidazole  IP. Finish 5-7 days course.  - Monitor CBC and  on antibiotics - Counseled on compliance on prophylactic atovaquone /acyclovir  to which she agrees.  - Universal/standard isolation precaution ID will sign off.  Call back with questions or concerns  Rest of the management as per the primary team. Thank you for the consult. Please page with pertinent questions or concerns.  ______________________________________________________________________ Subjective patient seen and examined at the bedside.  Continues to feel better in terms of shortness of breath and cough.   Vitals BP 135/73   Pulse 95   Temp 98.9 F (37.2 C) (Oral)   Resp (!) 28   Ht 5' 2 (1.575 m)   Wt 76.1 kg   SpO2 100%   BMI 30.69 kg/m     Physical Exam Constitutional: Elderly female sitting in the bed, on 6 L nasal cannula    Comments: HEENT WNL  Cardiovascular:     Rate and Rhythm: Normal rate and regular rhythm.     Heart sounds:   Pulmonary:     Effort: Pulmonary effort is normal on nasal cannula    Comments: Very minimal basilar crackles  Abdominal:     Palpations: Abdomen is soft.     Tenderness: Nondistended and nontender  Musculoskeletal:        General: No swelling or tenderness in peripheral joints/no signs of  septic joint  Skin:    Comments: No rashes  Neurological:     General: Awake, alert and oriented  Psychiatric:        Mood and Affect: Mood normal.   Pertinent Microbiology Results for orders placed or performed during the hospital encounter of 03/08/24  Resp panel by RT-PCR (RSV, Flu A&B, Covid) Anterior Nasal  Swab     Status: None   Collection Time: 03/08/24 11:38 AM   Specimen: Anterior Nasal Swab  Result Value Ref Range Status   SARS Coronavirus 2 by RT PCR NEGATIVE NEGATIVE Final    Comment: (NOTE) SARS-CoV-2 target nucleic acids are NOT DETECTED.  The SARS-CoV-2 RNA is generally detectable in upper respiratory specimens during the acute phase of infection. The lowest concentration of SARS-CoV-2 viral copies this assay can detect is 138 copies/mL. A negative result does not preclude SARS-Cov-2 infection and should not be used as the sole basis for treatment or other patient management decisions. A negative result may occur with  improper specimen collection/handling, submission of specimen other than nasopharyngeal swab, presence of viral mutation(s) within the areas targeted by this assay, and inadequate number of viral copies(<138 copies/mL). A negative result must be combined with clinical observations, patient history, and epidemiological information. The expected result is Negative.  Fact Sheet for Patients:  BloggerCourse.com  Fact Sheet for Healthcare Providers:  SeriousBroker.it  This test is no t yet approved or cleared by the [place]  FDA and  has been authorized for detection and/or diagnosis of SARS-CoV-2 by FDA under an Emergency Use Authorization (EUA). This EUA will remain  in effect (meaning this test can be used) for the duration of the COVID-19 declaration under Section 564(b)(1) of the Act, 21 U.S.C.section 360bbb-3(b)(1), unless the authorization is terminated  or revoked sooner.       Influenza A by PCR NEGATIVE NEGATIVE Final   Influenza B by PCR NEGATIVE NEGATIVE Final    Comment: (NOTE) The Xpert Xpress SARS-CoV-2/FLU/RSV plus assay is intended as an aid in the diagnosis of influenza from Nasopharyngeal swab specimens and should not be used as a sole basis for treatment. Nasal washings and aspirates  are unacceptable for Xpert Xpress SARS-CoV-2/FLU/RSV testing.  Fact Sheet for Patients: BloggerCourse.com  Fact Sheet for Healthcare Providers: SeriousBroker.it  This test is not yet approved or cleared by the [place]  FDA and has been authorized for detection and/or diagnosis of SARS-CoV-2 by FDA under an Emergency Use Authorization (EUA). This EUA will remain in effect (meaning this test can be used) for the duration of the COVID-19 declaration under Section 564(b)(1) of the Act, 21 U.S.C. section 360bbb-3(b)(1), unless the authorization is terminated or revoked.     Resp Syncytial Virus by PCR NEGATIVE NEGATIVE Final    Comment: (NOTE) Fact Sheet for Patients: BloggerCourse.com  Fact Sheet for Healthcare Providers: SeriousBroker.it  This test is not yet approved or cleared by the [place]  FDA and has been authorized for detection and/or diagnosis of SARS-CoV-2 by FDA under an Emergency Use Authorization (EUA). This EUA will remain in effect (meaning this test can be used) for the duration of the COVID-19 declaration under Section 564(b)(1) of the Act, 21 U.S.C. section 360bbb-3(b)(1), unless the authorization is terminated or revoked.  Performed at [facility], [address] [place], [place] [address]   Respiratory (~20 pathogens) panel by PCR     Status: None   Collection Time: 03/08/24 11:38 AM   Specimen: Nasopharyngeal Swab; Respiratory  Result Value  Ref Range Status   Adenovirus NOT DETECTED NOT DETECTED Final   Coronavirus 229E NOT DETECTED NOT DETECTED Final    Comment: (NOTE) The Coronavirus on the Respiratory Panel, DOES NOT test for the novel  Coronavirus (2019 nCoV)    Coronavirus HKU1 NOT DETECTED NOT DETECTED Final   Coronavirus NL63 NOT DETECTED NOT DETECTED Final   Coronavirus OC43 NOT DETECTED NOT DETECTED Final    Metapneumovirus NOT DETECTED NOT DETECTED Final   Rhinovirus / Enterovirus NOT DETECTED NOT DETECTED Final   Influenza A NOT DETECTED NOT DETECTED Final   Influenza B NOT DETECTED NOT DETECTED Final   Parainfluenza Virus 1 NOT DETECTED NOT DETECTED Final   Parainfluenza Virus 2 NOT DETECTED NOT DETECTED Final   Parainfluenza Virus 3 NOT DETECTED NOT DETECTED Final   Parainfluenza Virus 4 NOT DETECTED NOT DETECTED Final   Respiratory Syncytial Virus NOT DETECTED NOT DETECTED Final   Bordetella pertussis NOT DETECTED NOT DETECTED Final   Bordetella Parapertussis NOT DETECTED NOT DETECTED Final   Chlamydophila pneumoniae NOT DETECTED NOT DETECTED Final   Mycoplasma pneumoniae NOT DETECTED NOT DETECTED Final    Comment: Performed at [facility], [address], [place], [place] [address]  Blood Culture (routine x 2)     Status: None (Preliminary result)   Collection Time: 03/08/24  1:34 PM   Specimen: BLOOD LEFT ARM  Result Value Ref Range Status   Specimen Description   Final    BLOOD LEFT ARM Performed at [facility], [address], [place], [place] [address]    Special Requests   Final    BOTTLES DRAWN AEROBIC ONLY Blood Culture results may not be optimal due to an inadequate volume of blood received in culture bottles Performed at [facility], [address], [place], [place] [address]    Culture   Final    NO GROWTH 3 DAYS Performed at [facility], [address] [place], [place] [address]    Report Status PENDING  Incomplete  Blood Culture (routine x 2)     Status: None (Preliminary result)   Collection Time: 03/08/24  1:58 PM   Specimen: BLOOD LEFT ARM  Result Value Ref Range Status   Specimen Description   Final    BLOOD LEFT ARM Performed at [facility], [address] [place], [place] [address]    Special Requests   Final    BOTTLES DRAWN AEROBIC AND ANAEROBIC Blood Culture adequate volume Performed at [facility], [address] [place], [place] [address]    Culture   Final    NO GROWTH 3 DAYS Performed at [facility], [address] [place], [place] [address]    Report Status PENDING  Incomplete  MRSA Next Gen by PCR, Nasal     Status: None   Collection Time: 03/10/24 10:53 AM   Specimen: Nasal Mucosa; Nasal Swab  Result Value Ref Range Status   MRSA by PCR Next Gen NOT DETECTED NOT DETECTED Final    Comment: (NOTE) The GeneXpert MRSA Assay (FDA approved for NASAL specimens only), is one component of a comprehensive MRSA colonization surveillance program. It is not intended to diagnose MRSA infection nor to guide or monitor treatment for MRSA infections. Test performance is not FDA approved in patients less than 15 years old. Performed at [facility], [address] [place], [place] [address]    *Note: Due to a large  number of results and/or encounters for the requested time period, some results have not been displayed. A complete set of results can be found in Results Review.   Pertinent Lab.    Latest Ref Rng & Units 03/12/2024    3:08 AM 03/11/2024    4:30 AM 03/10/2024    2:58 AM  CBC  WBC 4.0 - 10.5 K/uL 5.1  7.2  6.2   Hemoglobin 12.0 - 15.0 g/dL 8.9  8.7  8.7   Hematocrit 36.0 - 46.0 % 29.7  28.2  27.5   Platelets 150 - 400 K/uL 102  103  97       Latest Ref Rng & Units 03/12/2024    3:08 AM 03/11/2024    1:33 PM 03/11/2024    4:30 AM  CMP  Glucose 70 - 99 mg/dL 855  813  846   BUN 8 - 23 mg/dL 21  22  20    Creatinine 0.44 - 1.00 mg/dL 9.36  9.28  9.30   Sodium 135 - 145 mmol/L 144  141  143   Potassium 3.5 - 5.1 mmol/L 4.3  3.2  3.2   Chloride 98 - 111 mmol/L 106  104  105   CO2 22 - 32 mmol/L 26  24  27    Calcium  8.9 - 10.3 mg/dL 8.8  8.9  8.7      Pertinent Imaging today Plain films and CT images have been personally visualized and interpreted; radiology reports have been reviewed. Decision making incorporated into  the Impression /   ECHOCARDIOGRAM COMPLETE Result Date: 03/11/2024    ECHOCARDIOGRAM REPORT   Patient Name:   MONIGUE SPRAGGINS Date of Exam: 03/11/2024 Medical Rec #:  [id number]      Height:       62.0 in Accession #:    [id number]     Weight:       160.0 lb Date of Birth:  08-14-1945      BSA:          1.739 m Patient Age:    77 years       BP:           94/54 mmHg Patient Gender: F              HR:           90 bpm. Exam Location:  Inpatient Procedure: 2D Echo, Intracardiac Opacification Agent, Cardiac Doppler and Color            Doppler (Both Spectral and Color Flow Doppler were utilized during            procedure). Indications:    CHF-Acute Diastolic I50.31  History:        Patient has prior history of Echocardiogram examinations, most                 recent 04/20/2021. Risk Factors:Hypertension and Former Smoker.  Sonographer:    Koleen Popper RDCS Referring Phys: [id number] PAULA SOUTHERLY IMPRESSIONS  1. Left ventricular ejection fraction, by estimation, is 35 to 40%. The left ventricle has moderately decreased function. The left ventricle has no regional wall motion abnormalities. Left ventricular diastolic parameters are consistent with Grade I diastolic dysfunction (impaired relaxation).  2. Right ventricular systolic function is normal. The right ventricular size is normal. There is mildly elevated pulmonary artery systolic pressure.  3. The mitral valve is normal in structure. Moderate mitral valve regurgitation. No evidence of mitral stenosis.  4. The aortic valve is normal  in structure. Aortic valve regurgitation is not visualized. No aortic stenosis is present.  5. The inferior vena cava is normal in size with greater than 50% respiratory variability, suggesting right atrial pressure of 3 mmHg. FINDINGS  Left Ventricle: Left ventricular ejection fraction, by estimation, is 35 to 40%. The left ventricle has moderately decreased function. The left ventricle has no regional wall motion abnormalities.  Definity  contrast agent was given IV to delineate the left ventricular endocardial borders. The left ventricular internal cavity size was normal in size. There is no left ventricular hypertrophy. Left ventricular diastolic parameters are consistent with Grade I diastolic dysfunction (impaired relaxation).  LV Wall Scoring: The apex is akinetic. The apical lateral segment, mid inferoseptal segment, apical septal segment, apical anterior segment, and apical inferior segment are hypokinetic. Right Ventricle: The right ventricular size is normal. No increase in right ventricular wall thickness. Right ventricular systolic function is normal. There is mildly elevated pulmonary artery systolic pressure. The tricuspid regurgitant velocity is 3.03  m/s, and with an assumed right atrial pressure of 8 mmHg, the estimated right ventricular systolic pressure is 44.7 mmHg. Left Atrium: Left atrial size was normal in size. Right Atrium: Right atrial size was normal in size. Pericardium: There is no evidence of pericardial effusion. Mitral Valve: The mitral valve is normal in structure. Mild mitral annular calcification. Moderate mitral valve regurgitation. No evidence of mitral valve stenosis. MV peak gradient, 6.2 mmHg. The mean mitral valve gradient is 3.0 mmHg. Tricuspid Valve: The tricuspid valve is normal in structure. Tricuspid valve regurgitation is mild . No evidence of tricuspid stenosis. Aortic Valve: The aortic valve is normal in structure. Aortic valve regurgitation is not visualized. No aortic stenosis is present. Pulmonic Valve: The pulmonic valve was normal in structure. Pulmonic valve regurgitation is trivial. No evidence of pulmonic stenosis. Aorta: The aortic root is normal in size and structure. Venous: The inferior vena cava is normal in size with greater than 50% respiratory variability, suggesting right atrial pressure of 3 mmHg. IAS/Shunts: No atrial level shunt detected by color flow Doppler.  LEFT  VENTRICLE PLAX 2D LVIDd:         4.20 cm   Diastology LVIDs:         3.10 cm   LV e' medial:    6.53 cm/s LV PW:         0.80 cm   LV E/e' medial:  15.2 LV IVS:        1.10 cm   LV e' lateral:   6.74 cm/s LVOT diam:     1.80 cm   LV E/e' lateral: 14.8 LV SV:         45 LV SV Index:   26 LVOT Area:     2.54 cm  RIGHT VENTRICLE             IVC RV S prime:     15.20 cm/s  IVC diam: 2.40 cm TAPSE (M-mode): 2.4 cm LEFT ATRIUM           Index        RIGHT ATRIUM           Index LA diam:      4.00 cm 2.30 cm/m   RA Area:     10.90 cm LA Vol (A2C): 36.0 ml 20.70 ml/m  RA Volume:   24.60 ml  14.15 ml/m LA Vol (A4C): 52.6 ml 30.25 ml/m  AORTIC VALVE LVOT Vmax:   102.00 cm/s LVOT Vmean:  70.600 cm/s  LVOT VTI:    0.176 m  AORTA Ao Root diam: 2.80 cm Ao Asc diam:  3.70 cm MITRAL VALVE                  TRICUSPID VALVE MV Area (PHT): 7.16 cm       TR Peak grad:   36.7 mmHg MV Area VTI:   1.89 cm       TR Vmax:        303.00 cm/s MV Peak grad:  6.2 mmHg MV Mean grad:  3.0 mmHg       SHUNTS MV Vmax:       1.25 m/s       Systemic VTI:  0.18 m MV Vmean:      81.2 cm/s      Systemic Diam: 1.80 cm MV Decel Time: 106 msec MR Peak grad:    108.8 mmHg MR Mean grad:    79.0 mmHg MR Vmax:         521.50 cm/s MR Vmean:        427.0 cm/s MR PISA:         1.01 cm MR PISA Eff ROA: 7 mm MR PISA Radius:  0.40 cm MV E velocity: 99.50 cm/s MV A velocity: 78.80 cm/s MV E/A ratio:  1.26 Aditya Sabharwal Electronically signed by Ria Commander Signature Date/Time: 03/11/2024/5:59:16 PM    Final    CT Angio Chest Pulmonary Embolism (PE) W or WO Contrast Result Date: 03/09/2024 EXAM: CTA of the Chest with contrast for PE 03/09/2024 12:17:09 AM TECHNIQUE: CTA of the chest was performed after the administration of intravenous contrast. Multiplanar reformatted images are provided for review. MIP images are provided for review. Automated exposure control, iterative reconstruction, and/or weight based adjustment of the mA/kV was utilized to  reduce the radiation dose to as low as reasonably achievable. COMPARISON: None available. CLINICAL HISTORY: Pulmonary embolism (PE) suspected, high prob. FINDINGS: PULMONARY ARTERIES: Pulmonary arteries are adequately opacified for evaluation. No pulmonary embolism. Main pulmonary artery is normal in caliber. MEDIASTINUM: The heart and pericardium demonstrate no acute abnormality. No pericardial effusion. Mild atherosclerotic calcification within the thoracic aorta. No aortic aneurysm. LYMPH NODES: No mediastinal, hilar or axillary lymphadenopathy. LUNGS AND PLEURA: Extensive bilateral airspace infiltrate with bilateral lower lobe dependent consolidation in keeping with multifocal infection or aspiration. Small bilateral pleural effusions. No pneumothorax. No central obstructing lesion. UPPER ABDOMEN: Moderate hiatal hernia. Cholelithiasis without superimposed pericholecystic inflammatory change. No intra or extrahepatic biliary ductal dilation. SOFT TISSUES AND BONES: Sclerotic margined lytic lesion within the T3 vertebral body, possibly an intraosseous hemangioma, with superimposed pathologic compression deformity noted. This is likely chronic in nature. Chronic mild L5 compression deformity noted. No retropulsion. Osseous structures are otherwise age appropriate. Right reverse shoulder arthroplasty noted. No acute bone abnormality. IMPRESSION: 1. No pulmonary embolism. 2. Extensive bilateral airspace infiltrate with bilateral lower lobe dependent consolidation, consistent with multifocal infection or aspiration. 3. Small bilateral pleural effusions. 4. Moderate hiatal hernia. Electronically signed by: Dorethia Molt MD 03/09/2024 12:24 AM EDT RP Workstation: [id number]   DG Chest Port 1 View Result Date: 03/08/2024 EXAM: 1 VIEW XRAY OF THE CHEST 03/08/2024 11:37:42 AM COMPARISON: 03/05/2024 CLINICAL HISTORY: Questionable sepsis - evaluate for abnormality. Per chart: tachycardia. She is currently receiving  chemo. Was seen last week for fevers at outpatient office. 89% on RA during triage. FINDINGS: LUNGS AND PLEURA: Calcified right lower lobe granuloma. No focal pulmonary opacity. No pulmonary edema. No pleural effusion. No pneumothorax. HEART AND  MEDIASTINUM: Aortic calcification. No acute abnormality of the cardiac and mediastinal silhouettes. BONES AND SOFT TISSUES: Right shoulder arthroplasty noted. Old healed right rib fractures. Degenerative changes of spine. No acute osseous abnormality. IMPRESSION: 1. No acute findings. Electronically signed by: Waddell Calk MD 03/08/2024 12:10 PM EDT RP Workstation: [id number]   DG Chest 2 View Result Date: 03/05/2024 CLINICAL DATA:  Fever, shortness of breath EXAM: CHEST - 2 VIEW COMPARISON:  April 05, 2021 chest radiograph FINDINGS: Cyst unchanged chronic calcified pulmonary granuloma in the right lower lung. No focal consolidations. No pleural effusions. No pneumothorax. Unchanged cardiomediastinal silhouette. No acute osseous findings. Partially imaged right shoulder arthroplasty hardware. IMPRESSION: No acute findings. Electronically Signed   By: Michaeline Blanch M.D.   On: 03/05/2024 12:07   I spent 35 minutes involved in face-to-face and non-face-to-face activities for this patient on the day of the visit. Professional time spent includes the following activities: Preparing to see the patient (review of tests), Obtaining and reviewing separately obtained history (PCCM progress note, hospitalist progress note), Performing a medically appropriate examination and evaluation, Ordering medications/labs, referring and communicating with other health care professionals, Documenting clinical information in the EMR, Independently interpreting results (not separately reported), Communicating results to the patient, Counseling and educating the patient and Care coordination (not separately reported).   Plan d/w requesting provider as well as ID pharm D  Of note, portions  of this note may have been created with voice recognition software. While this note has been edited for accuracy, occasional wrong-word or 'sound-a-like' substitutions may have occurred due to the inherent limitations of voice recognition software.   Electronically signed by:   Annalee Orem, MD Infectious Disease Physician [facility] Pager: [phone]

## 2024-03-12 NOTE — Plan of Care (Signed)
   Problem: Education: Goal: Knowledge of General Education information will improve Description Including pain rating scale, medication(s)/side effects and non-pharmacologic comfort measures Outcome: Progressing

## 2024-03-12 NOTE — Consult Note (Addendum)
 Cardiology Consultation   Patient ID: Jean Davidson MRN: [id number]; DOB: 08-Aug-1945  Admit date: 03/08/2024 Date of Consult: 03/12/2024  PCP:  Patient, [name]   [facility] Providers Cardiologist:  Deatrice Cage, MD        Patient Profile: Jean Davidson is a 78 y.o. female with a hx of hypertension, paroxysmal SVT, hx of pericardial effusion without intervention, bilateral mild carotid stenosis, relapsed/refractory multiple myeloma currently receiving Elrexfio , GERD, and chronic anemia who is being seen 03/12/2024 for the evaluation of acute heart failure at the request of Vernal Alstrom MD.  History of Present Illness: Jean Davidson was initially seen by Dr. Cage in 2016 for palpitations, a holter monitor showed NSR with infrequent PVCs/PACs. She was started on Coreg  for hypertension. She then underwent an ETT which was normal. She followed up with [facility] in 2020 for increasing frequency of palpitations, heart monitor at that time showed multiple runs of SVT. Her symptoms improved with transition from Coreg  to metoprolol .   Of note, in 2017 she did develop a large pericardial effusion that was managed by [facility], plans for a pericardiocentesis were made, however the effusion decreased without intervention prior to the procedure.  Echo in 2024 at [facility] showed LVEF >55% with mild LVH. G1 DD. Trivial Ar, PR, MR, and TR.   03/05/24 patient presented to oncology appointment and noted being febrile. She was given IV fluids and started on cipro .   Patient presented to the ED on 9/13 for tachycardia, shortness of breath, non-productive cough, chills, and myalgias. She was noted to be afebrile, BP 109/67, HR 104, and hypoxic [89% on RA].  Lactic Acid 2.0 -> 5.3. Code Sepsis was called. She was given IV fluids and IV antibiotics.  ECG sinus rhythm with non-specific ST changes VR 90  CXR was unremarkable CTA showed multifocal pneumonia vs aspiration. Small bilateral  pleural effusions Pro-BNP 16,972. Echocardiogram this admission showed LVEF 35-40% with no RWMA. G1 DD. Normal RV function. Mildly elevated PASP. Moderate MR.  PCCM and ID both following, patient currently on IV antibiotics, IV lasix , and steroids.  Net IO Since Admission: 2,190.15 mL [03/12/24 1301]  Home weight ~ 159-162lbs. Most recently charted weight 167lbs.   On interview, patient shared that she started noticing her weight increasing over the last week with a decreased appetite. Her smart watch was notifying her that he HR was also up to about 120 at times. While she has not noticed orthopnea or PND, she shares that this admission she finds she can get out of breath with conversation, though this is improving. Denied EOTH and illicit drug use.  Has been receiving chemo intermittently for about 10 years.  Denied lightheadedness/dizziness, chest pain, palpitations, orthopnea, PND, and abdominal distention. She does have chronic ankle edema at baseline.    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  Osteopenia    Spinal stenosis in cervical region    Varicose veins of lower extremities with inflammation     Past Surgical History:  Procedure Laterality Date   ANKLE SURGERY Left    BLADDER SUSPENSION  1988/1989   x2   COLONOSCOPY     ESOPHAGOGASTRODUODENOSCOPY     IR IMAGING GUIDED PORT INSERTION  03/01/2018   LIMBAL STEM CELL TRANSPLANT     x2   PORT-A-CATH REMOVAL     SHOULDER SURGERY  Right 04/06/2013   right   UPPER GASTROINTESTINAL ENDOSCOPY         Scheduled Meds:  acyclovir   400 mg Oral BID   aspirin  EC  81 mg Oral Daily   atovaquone   1,500 mg Oral BID   Chlorhexidine  Gluconate Cloth  6 each Topical Daily   cholecalciferol   4,000 Units Oral Daily   furosemide   80 mg Intravenous BID   insulin  aspart  0-6 Units Subcutaneous TID AC & HS   metoprolol  succinate  12.5 mg Oral Daily   mirtazapine   15 mg Oral QHS   multivitamin  2 tablet Oral BID   pantoprazole   40 mg Oral Daily   saccharomyces boulardii  250 mg Oral BID   Continuous Infusions:  metronidazole  Stopped (03/12/24 0957)   PRN Meds: acetaminophen  **OR** acetaminophen , antiseptic oral rinse, calcium  carbonate, diphenoxylate -atropine , guaiFENesin -dextromethorphan , hydrALAZINE , labetalol , levalbuterol , loperamide , melatonin, metoprolol  tartrate, morphine  injection, ondansetron  **OR** ondansetron  (ZOFRAN ) IV, mouth rinse, polyethylene glycol, sodium chloride  flush  Allergies:   No Known Allergies  Social History:   Social History   Socioeconomic History   Marital status: Divorced    Spouse name: Not on file   Number of children: Not on file   Years of education: Not on file   Highest education level: Not on file  Occupational History   Not on file  Tobacco Use   Smoking status: Former    Current packs/day: 0.00    Average packs/day: 0.5 packs/day for 4.0 years (2.0 ttl pk-yrs)    Types: Cigarettes    Start date: 06/26/1964    Quit date: 06/26/1968    Years since quitting: 55.7   Smokeless tobacco: Never  Vaping Use   Vaping status: Never Used  Substance and Sexual Activity   Alcohol  use: Yes    Alcohol /week: 1.0 standard drink of alcohol     Types: 1 Cans of beer per week    Comment: 1 per week per pt   Drug use: No   Sexual activity: Not on file  Other Topics Concern   Not on file  Social History Narrative   [name] is daughter eleni, frank will,  full code ( reviewed 58)    Social Drivers of Health   Financial Resource Strain: Low Risk  (10/09/2023)   Received from [facility]   Overall Financial Resource Strain (CARDIA)    Difficulty of Paying Living Expenses: Not hard at all  Food Insecurity: No Food Insecurity (03/09/2024)   Hunger Vital Sign    Worried About Running Out of Food in the Last Year: Never true    Ran Out of Food in the Last Year: Never true  Transportation Needs: No Transportation Needs (03/09/2024)   PRAPARE - Administrator, Civil Service (Medical): No    Lack of Transportation (Non-Medical): No  Physical Activity: Not on file  Stress: Not on file  Social Connections: Moderately Integrated (03/09/2024)   Social Connection and Isolation Panel    Frequency of Communication  with Friends and Family: More than three times a week    Frequency of Social Gatherings with Friends and Family: Twice a week    Attends Religious Services: 1 to 4 times per year    Active Member of [employer] or Organizations: Yes    Attends [employer] Meetings: 1 to 4 times per year    Marital Status: Divorced  Catering manager Violence: Not At Risk (03/09/2024)   Humiliation, Afraid, Rape, and Kick questionnaire    Fear of Current or Ex-Partner: No    Emotionally Abused: No    Physically Abused: No    Sexually Abused: No    Family History:   Family History  Problem Relation Age of Onset   Heart disease Mother    Heart failure Father    Heart disease Father    Throat cancer Father    Skin cancer Father    Diabetes Father    Kidney disease Father    Hypertension Brother    Heart disease Brother        congenital shunt, ?    Colon cancer Paternal Aunt 90   Skin cancer Brother    Stomach cancer Neg Hx    Esophageal cancer Neg Hx    Rectal cancer Neg Hx      ROS:  Please see the history of present illness.  All other ROS reviewed and negative.     Physical Exam/Data: Vitals:   03/12/24 1100 03/12/24 1200 03/12/24  1246 03/12/24 1250  BP: 136/78 112/80  112/80  Pulse: (!) 104 100 94 94  Resp: (!) 23 (!) 32 15   Temp:  98.7 F (37.1 C)    TempSrc:  Oral    SpO2: 99% 100% 98%   Weight:      Height:        Intake/Output Summary (Last 24 hours) at 03/12/2024 1301 Last data filed at 03/12/2024 1246 Gross per 24 hour  Intake 996.69 ml  Output 1950 ml  Net -953.31 ml      03/11/2024    1:25 PM 03/08/2024   11:06 AM 03/05/2024    8:33 AM  Last 3 Weights  Weight (lbs) 167 lb 12.3 oz 160 lb 159 lb  Weight (kg) 76.1 kg 72.576 kg 72.122 kg     Body mass index is 30.69 kg/m.  General:  Sitting up right in no acute distress with HFNC in place HEENT: normal Neck: no JVD Vascular: Distal pulses 2+ bilaterally Cardiac:  regular rhythm, tachycardic rate; no murmur  Lungs: Crackles to right base Abd: soft, nontender, no hepatomegaly  Ext: no edema Musculoskeletal:  No deformities, BUE and BLE strength normal and equal Skin: warm and dry  Neuro:  CNs 2-12 intact, no focal abnormalities noted Psych:  Normal affect   EKG:  The EKG was personally reviewed and demonstrates:  see hpi Telemetry:  Telemetry was personally reviewed and demonstrates:  sinus tachycardia HR 100-140, PVCs noted  Relevant CV Studies: Echocardiogram 04/20/21 IMPRESSIONS     1. Left ventricular ejection fraction, by estimation, is 60 to 65%. The  left ventricle has normal function. The left ventricle has no regional  wall motion abnormalities. Left ventricular diastolic parameters were  normal.   2. Right ventricular systolic function is normal. The right ventricular  size is normal.   3. The mitral valve is normal in structure. No evidence of mitral valve  regurgitation. No evidence of mitral stenosis.   4. The aortic valve is normal in structure.  Aortic valve regurgitation is  not visualized. No aortic stenosis is present.   5. The inferior vena cava is normal in size with greater than 50%  respiratory variability,  suggesting right atrial pressure of 3 mmHg.   Laboratory Data: Chemistry Recent Labs  Lab 03/11/24 0430 03/11/24 1333 03/12/24 0308  NA 143 141 144  K 3.2* 3.2* 4.3  CL 105 104 106  CO2 27 24 26   GLUCOSE 153* 186* 144*  BUN 20 22 21   CREATININE 0.69 0.71 0.63  CALCIUM  8.7* 8.9 8.8*  MG 2.1 2.0 2.1  GFRNONAA >60 >60 >60  ANIONGAP 11 14 12     Recent Labs  Lab 03/08/24 1138  PROT 6.4*  ALBUMIN 4.1  AST 41  ALT 12  ALKPHOS 99  BILITOT 0.3    Hematology Recent Labs  Lab 03/10/24 0258 03/11/24 0430 03/12/24 0308  WBC 6.2 7.2 5.1  RBC 2.62* 2.70* 2.80*  HGB 8.7* 8.7* 8.9*  HCT 27.5* 28.2* 29.7*  MCV 105.0* 104.4* 106.1*  MCH 33.2 32.2 31.8  MCHC 31.6 30.9 30.0  RDW 14.2 14.4 14.5  PLT 97* 103* 102*   BNP Recent Labs  Lab 03/10/24 0258  PROBNP 16,972.0*    DDimer  Recent Labs  Lab 03/08/24 1855  DDIMER 1.99*    Radiology/Studies:  ECHOCARDIOGRAM COMPLETE Result Date: 03/11/2024    ECHOCARDIOGRAM REPORT   Patient Name:   MAURINE MOWBRAY Date of Exam: 03/11/2024 Medical Rec #:  [id number]      Height:       62.0 in Accession #:    [id number]     Weight:       160.0 lb Date of Birth:  12-02-1945      BSA:          1.739 m Patient Age:    77 years       BP:           94/54 mmHg Patient Gender: F              HR:           90 bpm. Exam Location:  Inpatient Procedure: 2D Echo, Intracardiac Opacification Agent, Cardiac Doppler and Color            Doppler (Both Spectral and Color Flow Doppler were utilized during            procedure). Indications:    CHF-Acute Diastolic I50.31  History:        Patient has prior history of Echocardiogram examinations, most                 recent 04/20/2021. Risk Factors:Hypertension and Former Smoker.  Sonographer:    Koleen Popper RDCS Referring Phys: [id number] PAULA SOUTHERLY IMPRESSIONS  1. Left ventricular ejection fraction, by estimation, is 35 to 40%. The left ventricle has moderately decreased function. The left ventricle has no  regional wall motion abnormalities. Left ventricular diastolic parameters are consistent with Grade I diastolic dysfunction (impaired relaxation).  2. Right ventricular systolic function is normal. The right ventricular size is normal. There is mildly elevated pulmonary artery systolic pressure.  3. The mitral valve is normal in structure. Moderate mitral valve regurgitation. No evidence of mitral stenosis.  4. The aortic valve is normal in structure. Aortic valve regurgitation is not visualized. No aortic stenosis is present.  5. The inferior vena cava is normal in size with greater than 50% respiratory variability, suggesting right atrial pressure of 3 mmHg. FINDINGS  Left Ventricle:  Left ventricular ejection fraction, by estimation, is 35 to 40%. The left ventricle has moderately decreased function. The left ventricle has no regional wall motion abnormalities. Definity  contrast agent was given IV to delineate the left ventricular endocardial borders. The left ventricular internal cavity size was normal in size. There is no left ventricular hypertrophy. Left ventricular diastolic parameters are consistent with Grade I diastolic dysfunction (impaired relaxation).  LV Wall Scoring: The apex is akinetic. The apical lateral segment, mid inferoseptal segment, apical septal segment, apical anterior segment, and apical inferior segment are hypokinetic. Right Ventricle: The right ventricular size is normal. No increase in right ventricular wall thickness. Right ventricular systolic function is normal. There is mildly elevated pulmonary artery systolic pressure. The tricuspid regurgitant velocity is 3.03  m/s, and with an assumed right atrial pressure of 8 mmHg, the estimated right ventricular systolic pressure is 44.7 mmHg. Left Atrium: Left atrial size was normal in size. Right Atrium: Right atrial size was normal in size. Pericardium: There is no evidence of pericardial effusion. Mitral Valve: The mitral valve is normal  in structure. Mild mitral annular calcification. Moderate mitral valve regurgitation. No evidence of mitral valve stenosis. MV peak gradient, 6.2 mmHg. The mean mitral valve gradient is 3.0 mmHg. Tricuspid Valve: The tricuspid valve is normal in structure. Tricuspid valve regurgitation is mild . No evidence of tricuspid stenosis. Aortic Valve: The aortic valve is normal in structure. Aortic valve regurgitation is not visualized. No aortic stenosis is present. Pulmonic Valve: The pulmonic valve was normal in structure. Pulmonic valve regurgitation is trivial. No evidence of pulmonic stenosis. Aorta: The aortic root is normal in size and structure. Venous: The inferior vena cava is normal in size with greater than 50% respiratory variability, suggesting right atrial pressure of 3 mmHg. IAS/Shunts: No atrial level shunt detected by color flow Doppler.  LEFT VENTRICLE PLAX 2D LVIDd:         4.20 cm   Diastology LVIDs:         3.10 cm   LV e' medial:    6.53 cm/s LV PW:         0.80 cm   LV E/e' medial:  15.2 LV IVS:        1.10 cm   LV e' lateral:   6.74 cm/s LVOT diam:     1.80 cm   LV E/e' lateral: 14.8 LV SV:         45 LV SV Index:   26 LVOT Area:     2.54 cm  RIGHT VENTRICLE             IVC RV S prime:     15.20 cm/s  IVC diam: 2.40 cm TAPSE (M-mode): 2.4 cm LEFT ATRIUM           Index        RIGHT ATRIUM           Index LA diam:      4.00 cm 2.30 cm/m   RA Area:     10.90 cm LA Vol (A2C): 36.0 ml 20.70 ml/m  RA Volume:   24.60 ml  14.15 ml/m LA Vol (A4C): 52.6 ml 30.25 ml/m  AORTIC VALVE LVOT Vmax:   102.00 cm/s LVOT Vmean:  70.600 cm/s LVOT VTI:    0.176 m  AORTA Ao Root diam: 2.80 cm Ao Asc diam:  3.70 cm MITRAL VALVE                  TRICUSPID VALVE  MV Area (PHT): 7.16 cm       TR Peak grad:   36.7 mmHg MV Area VTI:   1.89 cm       TR Vmax:        303.00 cm/s MV Peak grad:  6.2 mmHg MV Mean grad:  3.0 mmHg       SHUNTS MV Vmax:       1.25 m/s       Systemic VTI:  0.18 m MV Vmean:      81.2 cm/s       Systemic Diam: 1.80 cm MV Decel Time: 106 msec MR Peak grad:    108.8 mmHg MR Mean grad:    79.0 mmHg MR Vmax:         521.50 cm/s MR Vmean:        427.0 cm/s MR PISA:         1.01 cm MR PISA Eff ROA: 7 mm MR PISA Radius:  0.40 cm MV E velocity: 99.50 cm/s MV A velocity: 78.80 cm/s MV E/A ratio:  1.26 Aditya Sabharwal Electronically signed by Ria Commander Signature Date/Time: 03/11/2024/5:59:16 PM    Final    CT Angio Chest Pulmonary Embolism (PE) W or WO Contrast Result Date: 03/09/2024 EXAM: CTA of the Chest with contrast for PE 03/09/2024 12:17:09 AM TECHNIQUE: CTA of the chest was performed after the administration of intravenous contrast. Multiplanar reformatted images are provided for review. MIP images are provided for review. Automated exposure control, iterative reconstruction, and/or weight based adjustment of the mA/kV was utilized to reduce the radiation dose to as low as reasonably achievable. COMPARISON: None available. CLINICAL HISTORY: Pulmonary embolism (PE) suspected, high prob. FINDINGS: PULMONARY ARTERIES: Pulmonary arteries are adequately opacified for evaluation. No pulmonary embolism. Main pulmonary artery is normal in caliber. MEDIASTINUM: The heart and pericardium demonstrate no acute abnormality. No pericardial effusion. Mild atherosclerotic calcification within the thoracic aorta. No aortic aneurysm. LYMPH NODES: No mediastinal, hilar or axillary lymphadenopathy. LUNGS AND PLEURA: Extensive bilateral airspace infiltrate with bilateral lower lobe dependent consolidation in keeping with multifocal infection or aspiration. Small bilateral pleural effusions. No pneumothorax. No central obstructing lesion. UPPER ABDOMEN: Moderate hiatal hernia. Cholelithiasis without superimposed pericholecystic inflammatory change. No intra or extrahepatic biliary ductal dilation. SOFT TISSUES AND BONES: Sclerotic margined lytic lesion within the T3 vertebral body, possibly an intraosseous hemangioma,  with superimposed pathologic compression deformity noted. This is likely chronic in nature. Chronic mild L5 compression deformity noted. No retropulsion. Osseous structures are otherwise age appropriate. Right reverse shoulder arthroplasty noted. No acute bone abnormality. IMPRESSION: 1. No pulmonary embolism. 2. Extensive bilateral airspace infiltrate with bilateral lower lobe dependent consolidation, consistent with multifocal infection or aspiration. 3. Small bilateral pleural effusions. 4. Moderate hiatal hernia. Electronically signed by: Dorethia Molt MD 03/09/2024 12:24 AM EDT RP Workstation: [id number]     Assessment and Plan: Acute systolic and diastolic heart failure Acute Hypoxic respiratory failure  Patient admitted with sepsis/PNA requiring oxygen  supplementation Pro-BNP 16972 CTA showing evidence of volume overload Echo showed LVEF 35-40% with no RWMA. G1 DD. Moderate MR.   Etiology at this time is unclear, though she does have a history of hypertension and SVT/tachycardia. She has no prior history of CAD; in 2016 patient had a normal ETT and has been without anginal chest pain, though has noted shortness of breath on exertion. She does have known mild carotid stenosis indicating vasculopathy. Would not pursue cardiac catheterization at this time given acute presentation with sepsis/pneumonia still requiring oxygen   supplementation. Patient is eager to pursue interventions and medications to improve LV function.  Her current cancer treatment, Elrexfio , does not have a known cardiac adverse effect, do not suspect it is the etiology.   She has been receiving IV lasix  40 mg BID, though Net IO Since Admission: 2,190.15 mL [03/12/24 1301] as she is receiving multiple IV antibiotics. Her IV diuresis was increased this morning to IV lasix  80 mg BID. On exam she appears to still be volume up and is currently still requiring HFNC 6L.  Weight is up 160lbs -> 167lbs, would continue to trend.   Agree with IV lasix  80 mg BID, would continue to replete K   Blood pressure has improved today, was hypotensive yesterday. Would not be aggressive with GDMT titration today.  Start metoprolol  succinate 12.5 mg with hold parameters.   Mitral regurgitation Would follow-up on echocardiogram once patient is more compensated as the degree of regurgitation may be influenced by acute presentation of ADHF.   Hypertension BP: 145/77 GDMT as above  Paroxysmal SVT Telemetry shows no evidence of SVT Patient denied recent palpitations. Will switch PTA lopressor  to Toprol  for mortality benefit.    Mild bilateral carotid stenosis Continue ASA 81 mg  Per primary Sepsis/PNA Multiple Myeloma  Chronic anemia Diarrhea GERD    Risk Assessment/Risk Scores:     [place]  Heart Association (NYHA) Functional Class NYHA Class III    For questions or updates, please contact [facility] Please consult [email or web address] for contact info under    Signed, Lynwood Schilling, MD  03/12/2024 1:01 PM  History and all data above reviewed.  I personally took the history today, performed the physical exam and formulated the substantive portion of the assessment and plan.  I reviewed all relevant tests and studies. Patient examined.  I agree with the findings as above.  The patient has had ongoing chemotherapy as above.  She has been fatigued with is not doing as much recently.  She has not been going out to the grocery store.  She lives alone.  She has been getting increasing dyspnea on exertion to the point where she called the ambulance.  She is not describing PND or orthopnea but she is getting short of breath just walking short distance in her house.  She had her watch reporting tachycardia with rates in the 110s or higher.  This would be with minimal exertion or at rest.  She would feel her heart beating fast.  She did not have presyncope or syncope.  She has not had any chest pressure, neck or arm  discomfort.  She had no cough.  She did have fevers.  She had some maybe mild lower extremity swelling.  She does not wear self routinely.  She is being treated for pneumonia and possible sepsis.  Echocardiogram demonstrates newly reduced ejection fraction.  I did think that there was some apical hypokinesis and there could be a suggestion of the Takotsubo's appearance although the EKG initially did not support this.  I do not see any enzymes ordered.  The patient exam reveals COR: Regular rate and rhythm, no rubs, no murmurs,  Lungs: Bilateral basilar crackles,  Abd: Positive bowel sounds normal frequency pitch, bruits, rebound, or guarding, Ext 2+ pulses, no edema.  All available labs, radiology testing, previous records reviewed. Agree with documented assessment and plan.  Acute systolic heart failure: I will check a troponin.  However, I do not have a strong suspicion for acute coronary syndrome.  This is  likely acute systolic heart failure related to pneumonia and sepsis.  Will manage this conservatively and titrate meds slowly.  She is diuresing reasonably and feels better.  Subsequently we will follow-up with an echo as an outpatient and if her ejection fraction remains low consider further evaluation to rule out ischemia.  Mitral regurgitation: This will be assessed after optimal medical therapy with follow-up echo.  Tachycardia: This will be managed in the context of treating her cardiomyopathy.  Lynwood Jackye Dever  1:24 PM  03/12/2024

## 2024-03-13 ENCOUNTER — Telehealth: Payer: Self-pay | Admitting: *Deleted

## 2024-03-13 DIAGNOSIS — R6520 Severe sepsis without septic shock: Secondary | ICD-10-CM | POA: Diagnosis not present

## 2024-03-13 DIAGNOSIS — A419 Sepsis, unspecified organism: Secondary | ICD-10-CM | POA: Diagnosis not present

## 2024-03-13 DIAGNOSIS — I5041 Acute combined systolic (congestive) and diastolic (congestive) heart failure: Secondary | ICD-10-CM | POA: Diagnosis not present

## 2024-03-13 DIAGNOSIS — J9601 Acute respiratory failure with hypoxia: Secondary | ICD-10-CM | POA: Diagnosis not present

## 2024-03-13 LAB — GLUCOSE, CAPILLARY
Glucose-Capillary: 108 mg/dL — ABNORMAL HIGH (ref 70–99)
Glucose-Capillary: 129 mg/dL — ABNORMAL HIGH (ref 70–99)
Glucose-Capillary: 189 mg/dL — ABNORMAL HIGH (ref 70–99)
Glucose-Capillary: 150 mg/dL — ABNORMAL HIGH (ref 70–99)

## 2024-03-13 LAB — CULTURE, BLOOD (ROUTINE X 2)
Culture: NO GROWTH
Culture: NO GROWTH
Special Requests: ADEQUATE

## 2024-03-13 LAB — CBC WITH DIFFERENTIAL/PLATELET
Abs Immature Granulocytes: 0.12 K/uL — ABNORMAL HIGH (ref 0.00–0.07)
Basophils Absolute: 0 K/uL (ref 0.0–0.1)
Basophils Relative: 0 %
Eosinophils Absolute: 0 K/uL (ref 0.0–0.5)
Eosinophils Relative: 0 %
HCT: 29.3 % — ABNORMAL LOW (ref 36.0–46.0)
Hemoglobin: 9 g/dL — ABNORMAL LOW (ref 12.0–15.0)
Immature Granulocytes: 3 %
Lymphocytes Relative: 20 %
Lymphs Abs: 0.8 K/uL (ref 0.7–4.0)
MCH: 32.5 pg (ref 26.0–34.0)
MCHC: 30.7 g/dL (ref 30.0–36.0)
MCV: 105.8 fL — ABNORMAL HIGH (ref 80.0–100.0)
Monocytes Absolute: 0.5 K/uL (ref 0.1–1.0)
Monocytes Relative: 12 %
Neutro Abs: 2.7 K/uL (ref 1.7–7.7)
Neutrophils Relative %: 65 %
Platelets: 99 K/uL — ABNORMAL LOW (ref 150–400)
RBC: 2.77 MIL/uL — ABNORMAL LOW (ref 3.87–5.11)
RDW: 14.5 % (ref 11.5–15.5)
WBC: 4.1 K/uL (ref 4.0–10.5)
nRBC: 0 % (ref 0.0–0.2)

## 2024-03-13 LAB — BASIC METABOLIC PANEL WITH GFR
Anion gap: 12 (ref 5–15)
BUN: 21 mg/dL (ref 8–23)
CO2: 32 mmol/L (ref 22–32)
Calcium: 8.5 mg/dL — ABNORMAL LOW (ref 8.9–10.3)
Chloride: 100 mmol/L (ref 98–111)
Creatinine, Ser: 0.74 mg/dL (ref 0.44–1.00)
GFR, Estimated: >60 mL/min (ref >60)
Glucose, Bld: 131 mg/dL — ABNORMAL HIGH (ref 70–99)
Potassium: 3.7 mmol/L (ref 3.5–5.1)
Sodium: 143 mmol/L (ref 135–145)

## 2024-03-13 LAB — MAGNESIUM: Magnesium: 1.8 mg/dL (ref 1.7–2.4)

## 2024-03-13 MED ORDER — POTASSIUM CHLORIDE CRYS ER 20 MEQ PO TBCR
40.0000 meq | EXTENDED_RELEASE_TABLET | Freq: Once | ORAL | Status: AC
  Administered 2024-03-13: 40 meq via ORAL
  Filled 2024-03-13: qty 4

## 2024-03-13 MED ORDER — MUSCLE RUB 10-15 % EX CREA
1.0000 | TOPICAL_CREAM | CUTANEOUS | Status: DC | PRN
  Administered 2024-03-13: 1 via TOPICAL
  Filled 2024-03-13: qty 85

## 2024-03-13 MED ORDER — FUROSEMIDE 10 MG/ML IJ SOLN
80.0000 mg | Freq: Two times a day (BID) | INTRAMUSCULAR | Status: DC
  Administered 2024-03-13 – 2024-03-14 (×3): 80 mg via INTRAVENOUS
  Filled 2024-03-13 (×3): qty 8

## 2024-03-13 NOTE — Evaluation (Signed)
 Occupational Therapy Evaluation Patient Details Name: Jean Davidson MRN: [id number] DOB: 1945-10-14 Today's Date: 03/13/2024   History of Present Illness   Jean Davidson is a 78 year old female admitted to [facility] with CAP, acute systolic HF and sepsis. PMH: relapsed/Refractory IgA Lambda Multiple Myeloma, In Remission, GERD, HTN, macular degeneration, cervical spinal stenosis     Clinical Impressions PTA, patient lives at home alone, was independence with amb, A/IADL's except housekeeping (has 2x/month assist) and driving with closest family in [place] and some friends in the community for support and was not on O2 at baseline. Is currently receiving outpatient CA treatment.  Currently, patient presents with deficits outlined below (see OT Problem List for details) most significantly on supplemental O2 with decreased activity tolerance, balance and generalized muscle weakness limiting BADL's and functional mobility performance. Patient requires continued Acute care hospital level OT services to progress safety and functional performance and allow for discharge. Patient will benefit from continued inpatient follow up therapy, <3 hours/day.        If plan is discharge home, recommend the following:   A lot of help with walking and/or transfers;A lot of help with bathing/dressing/bathroom;Assistance with cooking/housework;Assist for transportation;Help with stairs or ramp for entrance     Functional Status Assessment   Patient has had a recent decline in their functional status and demonstrates the ability to make significant improvements in function in a reasonable and predictable amount of time.     Equipment Recommendations   Other (comment)      Precautions/Restrictions   Precautions Precautions: Fall Precaution/Restrictions Comments: watch sats Restrictions Weight Bearing Restrictions Per Provider Order: No     Mobility Bed Mobility Overal bed mobility: Needs Assistance Bed  Mobility: Supine to Sit, Sit to Supine     Supine to sit: Min assist, HOB elevated, Used rails Sit to supine: Min assist, HOB elevated, Used rails   General bed mobility comments: increased time    Transfers Overall transfer level: Needs assistance Equipment used: Rolling walker (2 wheels) Transfers: Sit to/from Stand, Bed to chair/wheelchair/BSC Sit to Stand: Min assist     Step pivot transfers: Min assist     General transfer comment: min cues for pacing, hand placement and managing lines and O2      Balance Overall balance assessment: Needs assistance Sitting-balance support: No upper extremity supported, Feet supported Sitting balance-Leahy Scale: Fair     Standing balance support: During functional activity, Bilateral upper extremity supported Standing balance-Leahy Scale: Fair                             ADL either performed or assessed with clinical judgement   ADL Overall ADL's : Needs assistance/impaired Eating/Feeding: Set up;Sitting   Grooming: Wash/dry hands;Wash/dry face;Oral care;Set up;Sitting   Upper Body Bathing: Minimal assistance;Sitting   Lower Body Bathing: Moderate assistance;Sit to/from stand   Upper Body Dressing : Minimal assistance;Sitting   Lower Body Dressing: Moderate assistance;Sit to/from stand   Toilet Transfer: Minimal assistance;BSC/3in1;Stand-pivot   Toileting- Clothing Manipulation and Hygiene: Minimal assistance;Sit to/from stand       Functional mobility during ADLs: Rolling walker (2 wheels);Minimal assistance (O2 dep and fatigues with activity) General ADL Comments: requires rests and assist for all LE reach     Vision Baseline Vision/History: 1 Wears glasses;6 Macular Degeneration Ability to See in Adequate Light: 0 Adequate Patient Visual Report: No change from baseline Vision Assessment?: No apparent visual deficits  Pertinent Vitals/Pain Pain Assessment Pain Assessment: 0-10 Pain  Score: 0-No pain     Extremity/Trunk Assessment Upper Extremity Assessment Upper Extremity Assessment: Generalized weakness;Left hand dominant   Lower Extremity Assessment Lower Extremity Assessment: Generalized weakness   Cervical / Trunk Assessment Cervical / Trunk Assessment: Normal   Communication Communication Communication: No apparent difficulties   Cognition Arousal: Alert Behavior During Therapy: WFL for tasks assessed/performed Cognition: No apparent impairments                               Following commands: Intact       Cueing  General Comments   Cueing Techniques: Verbal cues  92-94% SpO2 on HFNC during light ADL's wiht cues for pacing           Home Living Family/patient expects to be discharged to:: Private residence Living Arrangements: Alone;Non-relatives/Friends Available Help at Discharge: Other (Comment) (cleaning assist 2 x per month and landscaping) Type of Home: House Home Access: Stairs to enter [employer] of Steps: 4 Entrance Stairs-Rails: Right Home Layout: Two level;Full bath on main level;Able to live on main level with bedroom/bathroom Alternate Level Stairs-Number of Steps: FF Alternate Level Stairs-Rails: Right;Left;Can reach both (has not been upstairs in months) Bathroom Shower/Tub: Walk-in shower;Door   [employer] Toilet: Standard Bathroom Accessibility: Yes How Accessible: Accessible via walker Home Equipment: Rollator (4 wheels);Hand held shower head;Shower seat;Cane - single point;Grab bars - toilet;Grab bars - tub/shower;Adaptive equipment Adaptive Equipment: Reacher Additional Comments: was not on O2 at baseline      Prior Functioning/Environment Prior Level of Function : Independent/Modified Independent;Driving             Mobility Comments: indep without AD ADLs Comments: has cleaning and landscape assist but indep with all other ADL's and IADL's and driving    OT Problem List:  Decreased strength;Decreased activity tolerance;Impaired balance (sitting and/or standing);Cardiopulmonary status limiting activity   OT Treatment/Interventions: Self-care/ADL training;Therapeutic exercise;Energy conservation;Neuromuscular education;DME and/or AE instruction;Therapeutic activities;Balance training;Patient/family education      OT Goals(Current goals can be found in the care plan section)   Acute Rehab OT Goals Patient Stated Goal: to get stronger and walk OT Goal Formulation: With patient Time For Goal Achievement: 03/27/24 Potential to Achieve Goals: Good ADL Goals Pt Will Perform Lower Body Bathing: with contact guard assist;with adaptive equipment;sit to/from stand Pt Will Perform Lower Body Dressing: with contact guard assist;sit to/from stand Pt Will Transfer to Toilet: regular height toilet;ambulating;with contact guard assist;grab bars Pt Will Perform Toileting - Clothing Manipulation and hygiene: with set-up;sit to/from stand Pt/caregiver will Perform Home Exercise Program: Both right and left upper extremity;With written HEP provided;Independently;Increased strength Additional ADL Goal #1: Patient will integ 4/5 5 P's for ECT with BADL's and FM.   OT Frequency:  Min 2X/week       AM-PAC OT 6 Clicks Daily Activity     Outcome Measure Help from another person eating meals?: A Little Help from another person taking care of personal grooming?: A Little Help from another person toileting, which includes using toliet, bedpan, or urinal?: A Lot Help from another person bathing (including washing, rinsing, drying)?: A Lot Help from another person to put on and taking off regular upper body clothing?: A Little Help from another person to put on and taking off regular lower body clothing?: A Lot 6 Click Score: 15   End of Session Equipment Utilized During Treatment: Gait belt;Rolling walker (2 wheels);Oxygen  Nurse Communication:  Mobility status;Other (comment)  (BM recorded on flowsheets)  Activity Tolerance: Patient limited by fatigue Patient left: in chair;with call bell/phone within reach;with chair alarm set  OT Visit Diagnosis: Unsteadiness on feet (R26.81);Muscle weakness (generalized) (M62.81)                Time: 9094-9045 OT Time Calculation (min): 49 min Charges:  OT General Charges $OT Visit: 1 Visit OT Evaluation $OT Eval Low Complexity: 1 Low  Makenzie Weisner OT/L Acute Rehabilitation Department  [phone]  03/13/2024, 10:54 AM

## 2024-03-13 NOTE — Progress Notes (Signed)
 PROGRESS NOTE  Jean Davidson FMW:[id number] DOB: 13-Jan-1946 DOA: 03/08/2024 PCP: Jean Davidson, [name]   LOS: 5 days   Brief narrative:  Jean Davidson is a 78 year old female with past medical history of relapsed/Refractory IgA Lambda Multiple Myeloma, In Remission, GERD, HTN, macular degeneration, cervical spinal stenosis presented to hospital with shortness of breath.  Jean Davidson was recently seen as outpatient for low-grade fever and was presumed to have UTI and received ciprofloxacin .  On this presentation she presented with progressive shortness of breath nonproductive cough and low-grade fever of 101.  In the ED, Jean Davidson was slightly tachycardic and needed supplemental oxygen .  Chest x-ray did not show any infiltrate.  Subsequently CT angiogram of the chest was done which showed negative for PE but bilateral airspace disease consistent with multifocal pneumonia..  Initial labs were notable for WBC at 4.2 with 2% bands,on elranatamab .  COVID, flu, RSV negative.  Procalcitonin was negative.  Jean Davidson had elevated lactate on presentation with trended up to 5.3.  Received 3-1/2 L of IV fluids in the ED.  Continue vancomycin  cefepime  and Flagyl .  Given acute respiratory failure, Jean Davidson also required BiPAP due to increased work of breathing and was admitted t for further evaluation and treatment.     Assessment/Plan: Principal Problem:   Severe sepsis (HCC) Active Problems:   Acute hypoxic respiratory failure (HCC)   CAP (community acquired pneumonia)   Multiple myeloma in remission (HCC)   Essential hypertension   Anemia due to antineoplastic chemotherapy   Diarrhea   GERD (gastroesophageal reflux disease)   Peripheral neuropathy due to chemotherapy (HCC)  Severe sepsis likely secondary to multifocal community-acquired pneumonia. Increased risk for multidrug-resistant organism given immunosuppression.  Jean Davidson had tachycardia, neutropenia lactic acidosis and pulmonary infiltrates suggestive of  sepsis secondary to multifocal pneumonia with fever and hypoxia requiring supplemental oxygen .  Jean Davidson received vancomycin , cefepime  and Flagyl  during hospitalization and currently has completed the course.  PCCM was consulted and added Zithromax  for 3 days on 9/15.  ID was consulted on 9/16 after oncology discussed case at the-committee.  Jean Davidson had not been taking  atovaquone  at home due to taste (takes every couple days).  Currently on high flow nasal cannula at 4 L/min.   Acute hypoxic respiratory failure secondary to severe multifocal pneumonia and heart failure.  PE was ruled out.  Was initially on high flow nasal cannula and BiPA, Solu-Medrol .  PCCM was present during hospitalization.  Jean Davidson has completed course of antibiotic and steroids at this time.  Has been off BiPAP.  Currently on at 4 L/min.  2D echocardiogram was performed on 03/11/2024 with LV ejection fraction of 35 to 40% with no regional wall motion abnormality and grade 1 diastolic dysfunction.  Cardiology has been consulted.  Acute systolic heart failure   New findings of LV ejection fraction of 35 to 40%.  Cardiology on board. Currently Jean Davidson is on on 80 mg of IV Lasix  twice daily with good diuresis.  Will continue to wean oxygen  as able.  Blood pressure marginally low so GDMT on hold.  On metoprolol  12.5 mg daily.    Multiple myeloma in remission  Follows up with Dr. Federico oncology as outpatient. - 2016: Underwent autologous stem cell transplant. 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 10/16/2022: Completed CAR-T therapy with [employer]. 09/2023: Started on Elrexfio  due to progression of disease. 01/02/2024: Last visit at [facility] where induction therapy was completed - Now on Elranatamab  (Elrexfio ); just had cycle 6 on 02/27/24 - Continue atovaquone  and  acyclovir  Previous provider had spoken with Dr. Federico on 03/11/2024; Jean Davidson due for IVIG on 03/12/2024.  Okay to wait until outpatient follow-up for infusion    Anemia due to antineoplastic chemotherapy Hemoglobin today at 9.0 from 8.9.  No evidence of bleeding.  Will continue to monitor.   Essential hypertension Oral antihypertensives on hold.  On diuretic and metoprolol .   Diarrhea. History of chronic diarrhea.  Was recently on ciprofloxacin .  Continue as needed Imodium  Lomotil .    GERD (gastroesophageal reflux disease) Continue Protonix   Debility weakness.  Will get PT OT evaluation.  Pending evaluation at this time.  DVT prophylaxis: SCDs Start: 03/08/24 1347   Disposition: Home with home health likely in 1 to 2 days.    Status is: Inpatient Remains inpatient appropriate because: Pending clinical improvement, IV diuresis, supplemental oxygen    Code Status:     Code Status: Do not attempt resuscitation (DNR) PRE-ARREST INTERVENTIONS DESIRED  Family Communication: None at bedside  Consultants: PCCM Cardiology  Procedures: None  Anti-infectives:  Acyclovir    Anti-infectives (From admission, onward)    Start     Dose/Rate Route Frequency Ordered Stop   03/10/24 1100  azithromycin  (ZITHROMAX ) 500 mg in sodium chloride  0.9 % 250 mL IVPB        500 mg 250 mL/hr over 60 Minutes Intravenous Every 24 hours 03/10/24 0928 03/12/24 1120   03/09/24 1200  vancomycin  (VANCOCIN ) IVPB 1000 mg/200 mL premix  Status:  Discontinued        1,000 mg 200 mL/hr over 60 Minutes Intravenous Every 24 hours 03/08/24 1554 03/10/24 2102   03/09/24 0000  ceFEPIme  (MAXIPIME ) 2 g in sodium chloride  0.9 % 100 mL IVPB        2 g 200 mL/hr over 30 Minutes Intravenous Every 12 hours 03/08/24 1554 03/12/24 1233   03/08/24 2200  metroNIDAZOLE  (FLAGYL ) IVPB 500 mg        500 mg 100 mL/hr over 60 Minutes Intravenous Every 12 hours 03/08/24 1523 03/12/24 2219   03/08/24 2200  acyclovir  (ZOVIRAX ) tablet 400 mg        400 mg Oral 2 times daily 03/08/24 1643     03/08/24 2200  atovaquone  (MEPRON ) 750 MG/5ML suspension 1,500 mg        1,500 mg Oral 2 times  daily 03/08/24 1846     03/08/24 1115  ceFEPIme  (MAXIPIME ) 2 g in sodium chloride  0.9 % 100 mL IVPB        2 g 200 mL/hr over 30 Minutes Intravenous  Once 03/08/24 1112 03/08/24 1408   03/08/24 1115  metroNIDAZOLE  (FLAGYL ) IVPB 500 mg        500 mg 100 mL/hr over 60 Minutes Intravenous  Once 03/08/24 1112 03/08/24 1302   03/08/24 1115  vancomycin  (VANCOCIN ) IVPB 1000 mg/200 mL premix        1,000 mg 200 mL/hr over 60 Minutes Intravenous  Once 03/08/24 1112 03/08/24 1533       Subjective: Today, Jean Davidson was seen and examined at bedside.  Jean Davidson states that she feels tired and fatigued.  Has some shortness of breath.  Much better.  No nausea vomiting or abdominal pain.  No fever or chills.  Has been having good diuresis.  Objective: Vitals:   03/13/24 0500 03/13/24 0800  BP: 112/63 106/60  Pulse: 91 94  Resp: 16 19  Temp:  99.1 F (37.3 C)  SpO2: 96% 92%    Intake/Output Summary (Last 24 hours) at 03/13/2024 1328 Last data filed at  03/13/2024 0100 Gross per 24 hour  Intake 129.93 ml  Output 1300 ml  Net -1170.07 ml   Filed Weights   03/08/24 1106 03/11/24 1325  Weight: 72.6 kg 76.1 kg   Body mass index is 30.69 kg/m.   Physical Exam: GENERAL: Jean Davidson is alert awake and oriented. Not in obvious distress., On  nasal cannula oxygen  at 4 L/min, Communicative. HENT: No scleral pallor or icterus. Pupils equally reactive to light. Oral mucosa is moist NECK: is supple, no gross swelling noted. CHEST: Breath sounds decreased bilaterally with some crackles.  Improved. CVS: S1 and S2 heard, no murmur. Regular rate and rhythm.  ABDOMEN: Soft, non-tender, bowel sounds are present. EXTREMITIES: Trace bilateral lower extremity edema CNS: Cranial nerves are intact. No focal motor deficits. SKIN: warm and dry without rashes.  Data Review: I have personally reviewed the following laboratory data and studies,  CBC: Recent Labs  Lab 03/09/24 0523 03/10/24 0258 03/11/24 0430  03/12/24 0308 03/13/24 0312  WBC 5.4 6.2 7.2 5.1 4.1  NEUTROABS 3.2 4.9 5.1 4.3 2.7  HGB 9.6* 8.7* 8.7* 8.9* 9.0*  HCT 31.9* 27.5* 28.2* 29.7* 29.3*  MCV 105.3* 105.0* 104.4* 106.1* 105.8*  PLT 105* 97* 103* 102* 99*   Basic Metabolic Panel: Recent Labs  Lab 03/10/24 0258 03/11/24 0430 03/11/24 1333 03/12/24 0308 03/13/24 0312  NA 139 143 141 144 143  K 3.8 3.2* 3.2* 4.3 3.7  CL 106 105 104 106 100  CO2 24 27 24 26  32  GLUCOSE 178* 153* 186* 144* 131*  BUN 12 20 22 21 21   CREATININE 0.57 0.69 0.71 0.63 0.74  CALCIUM  8.8* 8.7* 8.9 8.8* 8.5*  MG 2.3 2.1 2.0 2.1 1.8   Liver Function Tests: Recent Labs  Lab 03/08/24 1138  AST 41  ALT 12  ALKPHOS 99  BILITOT 0.3  PROT 6.4*  ALBUMIN 4.1   No results for input(s): LIPASE, AMYLASE in the last 168 hours. No results for input(s): AMMONIA in the last 168 hours. Cardiac Enzymes: No results for input(s): CKTOTAL, CKMB, CKMBINDEX, TROPONINI in the last 168 hours. BNP (last 3 results) No results for input(s): BNP in the last 8760 hours.  ProBNP (last 3 results) Recent Labs    03/10/24 0258  PROBNP 16,972.0*    CBG: Recent Labs  Lab 03/12/24 1154 03/12/24 1620 03/12/24 2123 03/13/24 0744 03/13/24 1126  GLUCAP 111* 230* 108* 108* 129*   Recent Results (from the past 240 hours)  Culture, blood (Routine X 2) w Reflex to ID Panel     Status: None   Collection Time: 03/05/24  9:41 AM   Specimen: BLOOD RIGHT HAND  Result Value Ref Range Status   Specimen Description   Final    BLOOD RIGHT HAND Performed at [facility], [address], [place], [place] [address]    Special Requests   Final    NONE Performed at [facility], [address], [place], [place] [address]    Culture   Final    NO GROWTH 5 DAYS Performed at [facility], [address], [place], [place] [address]    Report Status 03/10/2024 FINAL  Final  Resp panel by RT-PCR  (RSV, Flu A&B, Covid) Anterior Nasal Swab     Status: None   Collection Time: 03/05/24 10:10 AM   Specimen: Anterior Nasal Swab  Result Value Ref Range Status   SARS Coronavirus 2 by RT PCR NEGATIVE NEGATIVE Final  Comment: (NOTE) SARS-CoV-2 target nucleic acids are NOT DETECTED.  The SARS-CoV-2 RNA is generally detectable in upper respiratory specimens during the acute phase of infection. The lowest concentration of SARS-CoV-2 viral copies this assay can detect is 138 copies/mL. A negative result does not preclude SARS-Cov-2 infection and should not be used as the sole basis for treatment or other Jean Davidson management decisions. A negative result may occur with  improper specimen collection/handling, submission of specimen other than nasopharyngeal swab, presence of viral mutation(s) within the areas targeted by this assay, and inadequate number of viral copies(<138 copies/mL). A negative result must be combined with clinical observations, Jean Davidson history, and epidemiological information. The expected result is Negative.  Fact Sheet for Patients:  BloggerCourse.com  Fact Sheet for Healthcare Providers:  SeriousBroker.it  This test is no t yet approved or cleared by the [place]  FDA and  has been authorized for detection and/or diagnosis of SARS-CoV-2 by FDA under an Emergency Use Authorization (EUA). This EUA will remain  in effect (meaning this test can be used) for the duration of the COVID-19 declaration under Section 564(b)(1) of the Act, 21 U.S.C.section 360bbb-3(b)(1), unless the authorization is terminated  or revoked sooner.       Influenza A by PCR NEGATIVE NEGATIVE Final   Influenza B by PCR NEGATIVE NEGATIVE Final    Comment: (NOTE) The Xpert Xpress SARS-CoV-2/FLU/RSV plus assay is intended as an aid in the diagnosis of influenza from Nasopharyngeal swab specimens and should not be used as a sole basis for  treatment. Nasal washings and aspirates are unacceptable for Xpert Xpress SARS-CoV-2/FLU/RSV testing.  Fact Sheet for Patients: BloggerCourse.com  Fact Sheet for Healthcare Providers: SeriousBroker.it  This test is not yet approved or cleared by the [place]  FDA and has been authorized for detection and/or diagnosis of SARS-CoV-2 by FDA under an Emergency Use Authorization (EUA). This EUA will remain in effect (meaning this test can be used) for the duration of the COVID-19 declaration under Section 564(b)(1) of the Act, 21 U.S.C. section 360bbb-3(b)(1), unless the authorization is terminated or revoked.     Resp Syncytial Virus by PCR NEGATIVE NEGATIVE Final    Comment: (NOTE) Fact Sheet for Patients: BloggerCourse.com  Fact Sheet for Healthcare Providers: SeriousBroker.it  This test is not yet approved or cleared by the [place]  FDA and has been authorized for detection and/or diagnosis of SARS-CoV-2 by FDA under an Emergency Use Authorization (EUA). This EUA will remain in effect (meaning this test can be used) for the duration of the COVID-19 declaration under Section 564(b)(1) of the Act, 21 U.S.C. section 360bbb-3(b)(1), unless the authorization is terminated or revoked.  Performed at [facility], [address], [place], [place] [address]   Culture, blood (Routine X 2) w Reflex to ID Panel     Status: None   Collection Time: 03/05/24 10:21 AM   Specimen: BLOOD LEFT HAND  Result Value Ref Range Status   Specimen Description   Final    BLOOD LEFT HAND Performed at [facility], [address], [place], [place] [address]    Special Requests   Final    NONE Performed at [facility], [address] [place], [place] [address]    Culture   Final    NO GROWTH 5 DAYS Performed at [facility], [address], [place], [place] [address]    Report Status 03/10/2024 FINAL  Final  Urine Culture  Status: Abnormal   Collection Time: 03/05/24  3:12 PM   Specimen: Urine, Clean Catch  Result Value Ref Range Status   Specimen Description   Final    URINE, CLEAN CATCH Performed at [facility], [address] [place], [place] [address]    Special Requests   Final    NONE Performed at [facility], [address], [place], [place] [address]    Culture MULTIPLE SPECIES PRESENT, SUGGEST RECOLLECTION (A)  Final   Report Status 03/06/2024 FINAL  Final  Resp panel by RT-PCR (RSV, Flu A&B, Covid) Anterior Nasal Swab     Status: None   Collection Time: 03/08/24 11:38 AM   Specimen: Anterior Nasal Swab  Result Value Ref Range Status   SARS Coronavirus 2 by RT PCR NEGATIVE NEGATIVE Final    Comment: (NOTE) SARS-CoV-2 target nucleic acids are NOT DETECTED.  The SARS-CoV-2 RNA is generally detectable in upper respiratory specimens during the acute phase of infection. The lowest concentration of SARS-CoV-2 viral copies this assay can detect is 138 copies/mL. A negative result does not preclude SARS-Cov-2 infection and should not be used as the sole basis for treatment or other Jean Davidson management decisions. A negative result may occur with  improper specimen collection/handling, submission of specimen other than nasopharyngeal swab, presence of viral mutation(s) within the areas targeted by this assay, and inadequate number of viral copies(<138 copies/mL). A negative result must be combined with clinical observations, Jean Davidson history, and epidemiological information. The expected result is Negative.  Fact Sheet for Patients:  BloggerCourse.com  Fact Sheet for Healthcare Providers:  SeriousBroker.it  This test is no t yet approved or cleared by the [place]  FDA and  has  been authorized for detection and/or diagnosis of SARS-CoV-2 by FDA under an Emergency Use Authorization (EUA). This EUA will remain  in effect (meaning this test can be used) for the duration of the COVID-19 declaration under Section 564(b)(1) of the Act, 21 U.S.C.section 360bbb-3(b)(1), unless the authorization is terminated  or revoked sooner.       Influenza A by PCR NEGATIVE NEGATIVE Final   Influenza B by PCR NEGATIVE NEGATIVE Final    Comment: (NOTE) The Xpert Xpress SARS-CoV-2/FLU/RSV plus assay is intended as an aid in the diagnosis of influenza from Nasopharyngeal swab specimens and should not be used as a sole basis for treatment. Nasal washings and aspirates are unacceptable for Xpert Xpress SARS-CoV-2/FLU/RSV testing.  Fact Sheet for Patients: BloggerCourse.com  Fact Sheet for Healthcare Providers: SeriousBroker.it  This test is not yet approved or cleared by the [place]  FDA and has been authorized for detection and/or diagnosis of SARS-CoV-2 by FDA under an Emergency Use Authorization (EUA). This EUA will remain in effect (meaning this test can be used) for the duration of the COVID-19 declaration under Section 564(b)(1) of the Act, 21 U.S.C. section 360bbb-3(b)(1), unless the authorization is terminated or revoked.     Resp Syncytial Virus by PCR NEGATIVE NEGATIVE Final    Comment: (NOTE) Fact Sheet for Patients: BloggerCourse.com  Fact Sheet for Healthcare Providers: SeriousBroker.it  This test is not yet approved or cleared by the [place]  FDA and has been authorized for detection and/or diagnosis of SARS-CoV-2 by FDA under an Emergency Use Authorization (EUA). This EUA will remain in effect (meaning this test can be used) for the duration of the COVID-19 declaration under Section 564(b)(1) of the Act, 21 U.S.C. section 360bbb-3(b)(1), unless the  authorization is terminated or revoked.  Performed at [facility], [address], [place], [place] [address]   Respiratory (~20 pathogens) panel by PCR     Status: None   Collection Time: 03/08/24 11:38 AM   Specimen: Nasopharyngeal Swab; Respiratory  Result Value Ref Range Status   Adenovirus NOT DETECTED NOT DETECTED Final   Coronavirus 229E NOT DETECTED NOT DETECTED Final    Comment: (NOTE) The Coronavirus on the Respiratory Panel, DOES NOT test for the novel  Coronavirus (2019 nCoV)    Coronavirus HKU1 NOT DETECTED NOT DETECTED Final   Coronavirus NL63 NOT DETECTED NOT DETECTED Final   Coronavirus OC43 NOT DETECTED NOT DETECTED Final   Metapneumovirus NOT DETECTED NOT DETECTED Final   Rhinovirus / Enterovirus NOT DETECTED NOT DETECTED Final   Influenza A NOT DETECTED NOT DETECTED Final   Influenza B NOT DETECTED NOT DETECTED Final   Parainfluenza Virus 1 NOT DETECTED NOT DETECTED Final   Parainfluenza Virus 2 NOT DETECTED NOT DETECTED Final   Parainfluenza Virus 3 NOT DETECTED NOT DETECTED Final   Parainfluenza Virus 4 NOT DETECTED NOT DETECTED Final   Respiratory Syncytial Virus NOT DETECTED NOT DETECTED Final   Bordetella pertussis NOT DETECTED NOT DETECTED Final   Bordetella Parapertussis NOT DETECTED NOT DETECTED Final   Chlamydophila pneumoniae NOT DETECTED NOT DETECTED Final   Mycoplasma pneumoniae NOT DETECTED NOT DETECTED Final    Comment: Performed at [facility], [address] [place], [place] [address]  Blood Culture (routine x 2)     Status: None   Collection Time: 03/08/24  1:34 PM   Specimen: BLOOD LEFT ARM  Result Value Ref Range Status   Specimen Description   Final    BLOOD LEFT ARM Performed at [facility], [address] [place], [place] [address]    Special Requests   Final    BOTTLES DRAWN AEROBIC ONLY Blood Culture results may not be optimal due to an inadequate volume of blood received in culture  bottles Performed at [facility], [address], [place], [place] [address]    Culture   Final    NO GROWTH 5 DAYS Performed at [facility], [address] [place], [place] [address]    Report Status 03/13/2024 FINAL  Final  Blood Culture (routine x 2)     Status: None   Collection Time: 03/08/24  1:58 PM   Specimen: BLOOD LEFT ARM  Result Value Ref Range Status   Specimen Description   Final    BLOOD LEFT ARM Performed at [facility], [address], [place], [place] [address]    Special Requests   Final    BOTTLES DRAWN AEROBIC AND ANAEROBIC Blood Culture adequate volume Performed at [facility], [address], [place], [place] [address]    Culture   Final    NO GROWTH 5 DAYS Performed at [facility], [address] [place], [place] [address]    Report Status 03/13/2024 FINAL  Final  MRSA Next Gen by PCR, Nasal     Status: None   Collection Time: 03/10/24 10:53 AM   Specimen: Nasal Mucosa; Nasal Swab  Result Value Ref Range Status   MRSA by PCR Next Gen NOT DETECTED NOT DETECTED Final    Comment: (NOTE) The GeneXpert MRSA Assay (FDA approved for NASAL specimens only), is one component of a comprehensive MRSA colonization surveillance program. It is not intended to diagnose MRSA infection nor to guide  or monitor treatment for MRSA infections. Test performance is not FDA approved in patients less than 58 years old. Performed at [facility], [address], [place], [place] [address]      Studies: No results found.     Zoie Sarin, MD  [facility] 03/13/2024  If 7PM-7AM, please contact night-coverage

## 2024-03-13 NOTE — Evaluation (Signed)
 Physical Therapy Evaluation Patient Details Name: Jean Davidson MRN: [id number] DOB: 1945-10-09 Today's Date: 03/13/2024  History of Present Illness  Jean Davidson is a 78 year old female admitted to [facility] with CAP, acute systolic HF and sepsis. PMH: relapsed/Refractory IgA Lambda Multiple Myeloma, In Remission, GERD, HTN, macular degeneration, cervical spinal stenosis  Clinical Impression   PTA, patient lives at home alone, was independence with amb, A/IADL's except housekeeping (has 2x/month assist) and driving with closest family in [place] and some friends in the community for support and was not on O2 at baseline. Is currently receiving outpatient CA treatment.  Currently, patient presents with deficits outlined below (see PT Problem List for details) most significantly on supplemental O2 with decreased activity tolerance, balance and generalized muscle weakness limiting functional mobility performance. This date, pt progressed to standing only with ambulation deferred at pt request 2* ongoing issue with urinary incontinence.  Pt very determined and states she plans return home and will not consider SNF.  Pt states can arrange for hired 24/7 assist as needed and has done so with prior ankle injury. Patient requires continued Acute care hospital level PT services to progress safety and functional performance and allow for discharge. If appropriate assist level has been arranged, patient will benefit from follow up HHPT to maximize IND and safety following DC.     p  If plan is discharge home, recommend the following: A little help with walking and/or transfers;A little help with bathing/dressing/bathroom;Assistance with cooking/housework;Assist for transportation;Help with stairs or ramp for entrance   Can travel by private vehicle        Equipment Recommendations None recommended by PT  Recommendations for Other Services       Functional Status Assessment Patient has had a recent decline in their  functional status and demonstrates the ability to make significant improvements in function in a reasonable and predictable amount of time.     Precautions / Restrictions Precautions Precautions: Fall Precaution/Restrictions Comments: watch sats Restrictions Weight Bearing Restrictions Per Provider Order: No      Mobility  Bed Mobility               General bed mobility comments: Pt up in chair and requests back to same    Transfers Overall transfer level: Needs assistance Equipment used: Rolling walker (2 wheels) Transfers: Sit to/from Stand Sit to Stand: Min assist           General transfer comment: cues for use of UEs to self assist    Ambulation/Gait               General Gait Details: Stood only - pt declines to attempt ambulation 2* ongoing urniary incontinence (lasix ) and not wanting to have Purewick disconnected  Stairs            Wheelchair Mobility     Tilt Bed    Modified Rankin (Stroke Patients Only)       Balance   Sitting-balance support: No upper extremity supported, Feet supported Sitting balance-Leahy Scale: Fair     Standing balance support: During functional activity, Bilateral upper extremity supported Standing balance-Leahy Scale: Fair                               Pertinent Vitals/Pain Pain Assessment Pain Assessment: No/denies pain    Home Living Family/patient expects to be discharged to:: Private residence Living Arrangements: Alone;Non-relatives/Friends Available Help at Discharge: Other (Comment) Type of Home:  House Home Access: Stairs to enter Entrance Stairs-Rails: Right Entrance Stairs-Number of Steps: 4 Alternate Level Stairs-Number of Steps: FF Home Layout: Two level;Full bath on main level;Able to live on main level with bedroom/bathroom Home Equipment: Rollator (4 wheels);Hand held shower head;Shower seat;Cane - single point;Grab bars - toilet;Grab bars - tub/shower;Adaptive  equipment Additional Comments: was not on O2 at baseline    Prior Function Prior Level of Function : Independent/Modified Independent;Driving             Mobility Comments: indep without AD ADLs Comments: has cleaning and landscape assist but indep with all other ADL's and IADL's and driving     Extremity/Trunk Assessment   Upper Extremity Assessment Upper Extremity Assessment: Generalized weakness    Lower Extremity Assessment Lower Extremity Assessment: Generalized weakness       Communication   Communication Communication: No apparent difficulties    Cognition Arousal: Alert Behavior During Therapy: WFL for tasks assessed/performed                             Following commands: Intact       Cueing Cueing Techniques: Verbal cues     General Comments      Exercises     Assessment/Plan    PT Assessment Patient needs continued PT services  PT Problem List Decreased strength;Decreased activity tolerance;Decreased balance;Decreased mobility;Decreased knowledge of use of DME       PT Treatment Interventions DME instruction;Gait training;Stair training;Functional mobility training;Therapeutic activities;Therapeutic exercise;Balance training;Patient/family education    PT Goals (Current goals can be found in the Care Plan section)  Acute Rehab PT Goals Patient Stated Goal: Regain IND and go home - WILL NOT GO TO SNF PT Goal Formulation: With patient Time For Goal Achievement: 03/27/24 Potential to Achieve Goals: Good    Frequency Min 3X/week     Co-evaluation               AM-PAC PT 6 Clicks Mobility  Outcome Measure Help needed turning from your back to your side while in a flat bed without using bedrails?: A Little Help needed moving from lying on your back to sitting on the side of a flat bed without using bedrails?: A Lot Help needed moving to and from a bed to a chair (including a wheelchair)?: A Little Help needed standing  up from a chair using your arms (e.g., wheelchair or bedside chair)?: A Little Help needed to walk in hospital room?: A Lot Help needed climbing 3-5 steps with a railing? : A Lot 6 Click Score: 15    End of Session Equipment Utilized During Treatment: Gait belt;Oxygen  Activity Tolerance: Other (comment) (urninary incontinence limiting progress) Patient left: in chair;with call bell/phone within reach;with chair alarm set;Other (comment) (Physician in room) Nurse Communication: Mobility status PT Visit Diagnosis: Unsteadiness on feet (R26.81);Muscle weakness (generalized) (M62.81);Difficulty in walking, not elsewhere classified (R26.2)    Time: 8882-8862 PT Time Calculation (min) (ACUTE ONLY): 20 min   Charges:   PT Evaluation $PT Eval Low Complexity: 1 Low   PT General Charges $$ ACUTE PT VISIT: 1 Visit         [facility] PT Acute Rehabilitation Services Office [phone]   Jennings Stirling 03/13/2024, 1:50 PM

## 2024-03-13 NOTE — Telephone Encounter (Signed)
 Received vm message from pt. She states she remains hospitalized-now on step down unit. She is hopeful she will be home in time for her next treatment here on 03/19/24. She was to get IVIG this week her [facility] the cancer center. She is wondering if that is something she can get as an in-patient. Message sent to [place] [employer], PA-C and Mallie Hum, [facility]  Please advise

## 2024-03-13 NOTE — Progress Notes (Addendum)
 Progress Note  Patient Name: Jean Davidson Date of Encounter: 03/13/2024 [facility] Cardiologist: Deatrice Cage, MD   Interval Summary   Breathing has much improved, reporting good urine output. She is very eager to get out of the bed and work with PT.   Vital Signs Vitals:   03/13/24 0300 03/13/24 0400 03/13/24 0500 03/13/24 0800  BP: (!) 95/47 (!) 115/59 112/63   Pulse:  88 91   Resp:  13 16   Temp:  99 F (37.2 C)  99.1 F (37.3 C)  TempSrc:  Oral  Oral  SpO2:  97% 96%   Weight:      Height:        Intake/Output Summary (Last 24 hours) at 03/13/2024 0905 Last data filed at 03/13/2024 0100 Gross per 24 hour  Intake 579.93 ml  Output 1800 ml  Net -1220.07 ml      03/11/2024    1:25 PM 03/08/2024   11:06 AM 03/05/2024    8:33 AM  Last 3 Weights  Weight (lbs) 167 lb 12.3 oz 160 lb 159 lb  Weight (kg) 76.1 kg 72.576 kg 72.122 kg      Telemetry/ECG  Sinus rhythm, occasional PVC HR 85-100 - Personally Reviewed  Physical Exam  GEN: No acute distress.   Neck: No JVD Cardiac: RRR, no murmurs, rubs, or gallops.  Respiratory: Crackles to bilateral bases GI: Soft, tender to epigastrium, non-distended  MS: trace edema  Assessment & Plan  Jean Davidson is a 78 y.o. female with a hx of hypertension, paroxysmal SVT, hx of pericardial effusion without intervention, bilateral mild carotid stenosis, relapsed/refractory multiple myeloma currently receiving Elrexfio , GERD, and chronic anemia  who presented to the ED on 9/13 for tachycardia noted on her smart watch along with systemic illness symptoms. In the ED she was hypoxic and her lactic acid was elevated. Code Sepsis was called. It was noted that her Pro-BNP was 83207. Echo was obtained showing new HFrEF, cardiology was consulted.   Acute systolic and diastolic heart failure Acute Hypoxic respiratory failure Elevated Troponin  Patient admitted with sepsis/PNA requiring oxygen  supplementation Pro-BNP 16972 CTA  showed evidence of volume overload Echo showed LVEF 35-40% with no RWMA. G1 DD. Moderate MR.  Troponin 669 -> 649 Patient denied anginal symptoms. Notices occasional sharp chest pain that lasts seconds, suspect to be MSK vs coronary in etiology.   Possible stress cardiomyopathy, would hold off on cardiac catheterization at this time with her ongoing oxygen  requirements and sepsis. Could pursue outpatient if LV function does not improved by repeat echocardiogram.    Net IO Since Admission: 1,020.08 mL [03/13/24 0905] though still net +, improved from yesterday with increased IV diuresis. Her HFNC requirement has also been reduced.  Home weight ~ 160lb, will obtain weight today to follow trend.    Would not be aggressive with GDMT titration as patient is admitted with sepsis and having episodes of mild hypotension. continue metoprolol  succinate 12.5 mg with hold parameters.  continue IV lasix  80 mg BID, would continue to replete K    Mitral regurgitation Would follow-up on a repeat echocardiogram outpatient once patient is more compensated as the degree of regurgitation may be influenced by acute presentation of ADHF.    Hypertension BP: 112/63 GDMT as above   Paroxysmal SVT Telemetry shows no evidence of SVT  Patient denied recent palpitations. Metoprolol  as above   Mild bilateral carotid stenosis Continue ASA 81 mg   Per primary Sepsis/PNA Multiple Myeloma  Chronic anemia Diarrhea GERD   For questions or updates, please contact [facility] Please consult [email or web address] for contact info under       Signed, Leontine LOISE Salen, PA-C   History and all data above reviewed.  I personally took the history today, performed the physical exam and formulated the assessment and plan.  I reviewed all relevant tests and studies. Patient examined.  I agree with the findings as above.  The patient exam reveals COR:RRR  ,  Lungs: Clear  ,  Abd: Positive bowel sounds, no rebound no  guarding, Ext No edema  .  All available labs, radiology testing, previous records reviewed. Agree with documented assessment and plan. Acute systolic HF:  Continue IV diuresis.  Trop is elevated consistent with NSTEMI but I am not suspect ACS.  I wonder if this was a stress induced CM with the appearance of the echo.  I will repeat an EKG today.   BP is too low to titrate meds.  She is on low dose beta blocker.    Likely switch to PO diuretic tomorrow.       [name]  11:31 AM  03/13/2024

## 2024-03-13 NOTE — Progress Notes (Signed)
 NAME:  Jean Davidson, MRN:  [id number], DOB:  [birth year], LOS: 5 ADMISSION DATE:  03/08/2024, CONSULTATION DATE:  03/13/2024 REFERRING MD:  MELODIE, CHIEF COMPLAINT:  Acute Hypoxic respiratory Failure   History of Present Illness:  Jean Davidson is a 78 y/o F with relapsted/refractory multiple myeloma (in remission), GERD, HTN, and cervical stenosis who presents for acute hypoxic respiratory failure due to multifocal pneumonia and acute combined systolic and diastolic heart failure. Pulmonology consulted for evaluation and management of the latter  Pertinent  Medical History  As above  Significant Hospital Events: Including procedures, antibiotic start and stop dates in addition to other pertinent events   Admitted on 03/08/2024, started on cefepime , vanc, metronidazole , needed BiPAP but currently on HFNC BNP elevated to 16 K on 9/15, started diuresis Oxygen  improving with diuresis, Echo performed with LV EF 35%  Interim History / Subjective:  She's on 4 L O2 by Jean Davidson  Objective    Blood pressure 106/60, pulse 94, temperature 99.1 F (37.3 C), temperature source Oral, resp. rate 19, height 5' 2 (1.575 m), weight 76.1 kg, SpO2 92%.    FiO2 (%):  [40 %] 40 %   Intake/Output Summary (Last 24 hours) at 03/13/2024 1034 Last data filed at 03/13/2024 0100 Gross per 24 hour  Intake 479.93 ml  Output 1800 ml  Net -1320.07 ml   Filed Weights   03/08/24 1106 03/11/24 1325  Weight: 72.6 kg 76.1 kg    Examination: General: well appearing, no distress HENT: PERRL, no oral thrush Lungs: crackles in bases improved Cardiovascular: RRR, no r/m/g, JVD at 8 cm H2O Abdomen: soft, non-tender Extremities: 1+ pitting edema bilaterally Neuro: A&O x 3 GU: deferred  Resolved problem list   Assessment and Plan   #Acute Hypoxic Respiratory Failure: likely due to multifocal pneumonia and acute systolic heart failure. Meets severity criteria for steroids in the setting of severe CAP. CT chest reviewed and  shows extensive bilateral multifocal consolidations with dense consolidation in lower lobe. RVP negative. MRSA swab and sputum culture pending. BNP ordered and was elevated at 16 K, echo with newly reduced LVEF.  - Wean O2 as tolerated - Continue antimicrobials for max 5 days - Discontinue steroids for severe CAP - Diurese aggressively - Medical management for HFrEF per cardiology recommendations - The patient will need cardiology and pulmonology follow up as an outpatient  We will sign off at this time. If you have any concerns or questions, you can secure chat our team.  Labs   CBC: Recent Labs  Lab 03/09/24 0523 03/10/24 0258 03/11/24 0430 03/12/24 0308 03/13/24 0312  WBC 5.4 6.2 7.2 5.1 4.1  NEUTROABS 3.2 4.9 5.1 4.3 2.7  HGB 9.6* 8.7* 8.7* 8.9* 9.0*  HCT 31.9* 27.5* 28.2* 29.7* 29.3*  MCV 105.3* 105.0* 104.4* 106.1* 105.8*  PLT 105* 97* 103* 102* 99*    Basic Metabolic Panel: Recent Labs  Lab 03/10/24 0258 03/11/24 0430 03/11/24 1333 03/12/24 0308 03/13/24 0312  NA 139 143 141 144 143  K 3.8 3.2* 3.2* 4.3 3.7  CL 106 105 104 106 100  CO2 24 27 24 26  32  GLUCOSE 178* 153* 186* 144* 131*  BUN 12 20 22 21 21   CREATININE 0.57 0.69 0.71 0.63 0.74  CALCIUM  8.8* 8.7* 8.9 8.8* 8.5*  MG 2.3 2.1 2.0 2.1 1.8   GFR: Estimated Creatinine Clearance: 56.2 mL/min (by C-G formula based on SCr of 0.74 mg/dL). Recent Labs  Lab 03/08/24 1407 03/08/24 1424 03/08/24 1728 03/08/24 1855  03/09/24 0523 03/09/24 0620 03/10/24 0258 03/11/24 0430 03/12/24 0308 03/13/24 0312  PROCALCITON <0.10  --   --   --  <0.10  --   --   --   --   --   WBC  --   --   --   --  5.4  --  6.2 7.2 5.1 4.1  LATICACIDVEN  --  5.3* 1.8 3.0*  --  2.2*  --   --   --   --     Liver Function Tests: Recent Labs  Lab 03/08/24 1138  AST 41  ALT 12  ALKPHOS 99  BILITOT 0.3  PROT 6.4*  ALBUMIN 4.1   No results for input(s): LIPASE, AMYLASE in the last 168 hours. No results for input(s):  AMMONIA in the last 168 hours.  ABG    Component Value Date/Time   TCO2 25 03/08/2024 1216     Coagulation Profile: Recent Labs  Lab 03/08/24 1340  INR 1.1    Cardiac Enzymes: No results for input(s): CKTOTAL, CKMB, CKMBINDEX, TROPONINI in the last 168 hours.  HbA1C: Hgb A1c MFr Bld  Date/Time Value Ref Range Status  03/10/2024 02:58 AM 4.7 (L) 4.8 - 5.6 % Final    Comment:    (NOTE) Diagnosis of Diabetes The following HbA1c ranges recommended by the American Diabetes Association (ADA) may be used as an aid in the diagnosis of diabetes mellitus.  Hemoglobin             Suggested A1C NGSP%              Diagnosis  <5.7                   Non Diabetic  5.7-6.4                Pre-Diabetic  >6.4                   Diabetic  <7.0                   Glycemic control for                       adults with diabetes.    08/08/2013 11:27 AM 5.2 4.6 - 6.5 % Final    Comment:    Glycemic Control Guidelines for People with Diabetes:Non Diabetic:  <6%Goal of Therapy: <7%Additional Action Suggested:  >8%     CBG: Recent Labs  Lab 03/12/24 0728 03/12/24 1154 03/12/24 1620 03/12/24 2123 03/13/24 0744  GLUCAP 151* 111* 230* 108* 108*    Review of Systems:   Not obtained  Past Medical History:  She,  has a past medical history of Anemia, Anxiety, Carotid stenosis, Carpal tunnel syndrome, Cataract, Cervical disc disease, Disc degeneration, Failure of stem cell transplant (HCC), GERD (gastroesophageal reflux disease), 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   Surgical History:   Past Surgical History:  Procedure Laterality Date   ANKLE SURGERY Left    BLADDER SUSPENSION  1988/1989   x2   COLONOSCOPY     ESOPHAGOGASTRODUODENOSCOPY      IR IMAGING GUIDED PORT INSERTION  03/01/2018   LIMBAL STEM CELL TRANSPLANT     x2   PORT-A-CATH REMOVAL     SHOULDER SURGERY Right 04/06/2013   right  UPPER GASTROINTESTINAL ENDOSCOPY       Social History:   reports that she quit smoking about 55 years ago. Her smoking use included cigarettes. She started smoking about 59 years ago. She has a 2 pack-year smoking history. She has never used smokeless tobacco. She reports current alcohol  use of about 1.0 standard drink of alcohol  per week. She reports that she does not use drugs.   Family History:  Her family history includes Colon cancer (age of onset: 84) in her paternal aunt; Diabetes in her father; Heart disease in her brother, father, and mother; Heart failure in her father; Hypertension in her brother; Kidney disease in her father; Skin cancer in her brother and father; Throat cancer in her father. There is no history of Stomach cancer, Esophageal cancer, or Rectal cancer.   Allergies No Known Allergies   Home Medications  Prior to Admission medications   Medication Sig Start Date End Date Taking? Authorizing Provider  acyclovir  (ZOVIRAX ) 400 MG tablet Take 1 tablet (400 mg total) by mouth 2 (two) times daily. 08/07/23  Yes Gorsuch, Almarie, MD  aspirin  EC 81 MG tablet Take 81 mg by mouth daily. Swallow whole.   Yes [provider]  atovaquone  (MEPRON ) 750 MG/5ML suspension TAKE 10 MLS (1,500 MG TOTAL) BY MOUTH 2 (TWO) TIMES DAILY. 03/04/24  Yes Federico Norleen ONEIDA MADISON, MD  cholecalciferol  (VITAMIN D3) 25 MCG (1000 UNIT) tablet Take 4,000 Units by mouth daily. Instructed to increase to 4,000 units daily   Yes [provider]  ciprofloxacin  (CIPRO ) 250 MG tablet Take 3 tablets (750 mg total) by mouth 2 (two) times daily for 7 days. 03/05/24 03/12/24 Yes Walisiewicz, Kaitlyn E, PA-C  metoprolol  tartrate (LOPRESSOR ) 25 MG tablet TAKE 1 & 1/2 TABLET BY MOUTH TWICE A DAY--due for follow up appointment. 08/01/23  Yes Furth, Cadence H,  PA-C  mirtazapine  (REMERON ) 15 MG tablet TAKE 1 TABLET BY MOUTH EVERYDAY AT BEDTIME 01/28/24  Yes Dorsey, John T IV, MD  Multiple Vitamins-Minerals (PRESERVISION AREDS 2 PO) Take 1 tablet by mouth 2 (two) times daily.   Yes [provider]  ondansetron  (ZOFRAN ) 8 MG tablet Take 1 tablet (8 mg total) by mouth every 8 (eight) hours as needed (Nausea or vomiting). Patient taking differently: Take 8 mg by mouth 2 (two) times daily. 09/26/22  Yes Gorsuch, Ni, MD  pantoprazole  (PROTONIX ) 40 MG tablet TAKE 1 TABLET BY MOUTH 2 TIMES DAILY. TAKE 30-60 MINUTES BEFORE BREAKFAST AND DINNER. 03/06/24  Yes Avram Lupita BRAVO, MD  prochlorperazine  (COMPAZINE ) 5 MG tablet Take 5 mg by mouth every 6 (six) hours as needed for nausea or vomiting.   Yes [provider]  senna-docusate (SENOKOT-S) 8.6-50 MG tablet Take 2 tablets by mouth daily as needed. 10/23/22  Yes [provider]     Thank you for this interesting consult. I have spent 35 minutes evaluating patient, reviewing chart, and discussing plan of care with patient, family, and primary medical team. If you have any questions or concerns please reach out to me via pager ([phone]).  Paula Southerly, MD [facility]

## 2024-03-14 ENCOUNTER — Telehealth: Payer: Self-pay | Admitting: *Deleted

## 2024-03-14 DIAGNOSIS — D801 Nonfamilial hypogammaglobulinemia: Secondary | ICD-10-CM | POA: Diagnosis not present

## 2024-03-14 DIAGNOSIS — J9601 Acute respiratory failure with hypoxia: Secondary | ICD-10-CM | POA: Diagnosis not present

## 2024-03-14 DIAGNOSIS — C9001 Multiple myeloma in remission: Secondary | ICD-10-CM | POA: Diagnosis not present

## 2024-03-14 DIAGNOSIS — I4719 Other supraventricular tachycardia: Secondary | ICD-10-CM | POA: Diagnosis not present

## 2024-03-14 DIAGNOSIS — I340 Nonrheumatic mitral (valve) insufficiency: Secondary | ICD-10-CM | POA: Diagnosis not present

## 2024-03-14 DIAGNOSIS — D6481 Anemia due to antineoplastic chemotherapy: Secondary | ICD-10-CM | POA: Diagnosis not present

## 2024-03-14 DIAGNOSIS — I5041 Acute combined systolic (congestive) and diastolic (congestive) heart failure: Secondary | ICD-10-CM | POA: Diagnosis not present

## 2024-03-14 DIAGNOSIS — A419 Sepsis, unspecified organism: Secondary | ICD-10-CM | POA: Diagnosis not present

## 2024-03-14 LAB — EHRLICHIA ANTIBODY PANEL
E chaffeensis (HGE) Ab, IgG: NEGATIVE
E chaffeensis (HGE) Ab, IgM: NEGATIVE
E. Chaffeensis (HME) IgM Titer: NEGATIVE
E.Chaffeensis (HME) IgG: NEGATIVE

## 2024-03-14 LAB — GLUCOSE, CAPILLARY
Glucose-Capillary: 134 mg/dL — ABNORMAL HIGH (ref 70–99)
Glucose-Capillary: 200 mg/dL — ABNORMAL HIGH (ref 70–99)
Glucose-Capillary: 120 mg/dL — ABNORMAL HIGH (ref 70–99)
Glucose-Capillary: 168 mg/dL — ABNORMAL HIGH (ref 70–99)

## 2024-03-14 LAB — BASIC METABOLIC PANEL WITH GFR
Anion gap: 15 (ref 5–15)
BUN: 24 mg/dL — ABNORMAL HIGH (ref 8–23)
CO2: 33 mmol/L — ABNORMAL HIGH (ref 22–32)
Calcium: 9 mg/dL (ref 8.9–10.3)
Chloride: 94 mmol/L — ABNORMAL LOW (ref 98–111)
Creatinine, Ser: 0.74 mg/dL (ref 0.44–1.00)
GFR, Estimated: >60 mL/min (ref >60)
Glucose, Bld: 133 mg/dL — ABNORMAL HIGH (ref 70–99)
Potassium: 3.5 mmol/L (ref 3.5–5.1)
Sodium: 142 mmol/L (ref 135–145)

## 2024-03-14 MED ORDER — IMMUNE GLOBULIN (HUMAN) 10 GM/100ML IV SOLN
400.0000 mg/kg | Freq: Once | INTRAVENOUS | Status: AC
  Administered 2024-03-15: 25 g via INTRAVENOUS
  Filled 2024-03-14: qty 250

## 2024-03-14 MED ORDER — METOPROLOL SUCCINATE ER 25 MG PO TB24
25.0000 mg | ORAL_TABLET | Freq: Every day | ORAL | Status: DC
  Administered 2024-03-14 – 2024-03-20 (×7): 25 mg via ORAL
  Filled 2024-03-14 (×7): qty 1

## 2024-03-14 NOTE — TOC Progression Note (Signed)
 Transition of Care [facility]) - Progression Note    Patient Details  Name: Jean Davidson MRN: [id number] Date of Birth: Aug 15, 1945  [facility] CM/SW Contact  Jon ONEIDA Anon, RN Phone Number: 03/14/2024, 12:03 PM  Clinical Narrative:    NCM me with pt at beside to discuss PT recommendation for HHPT. Pt hesitant, stating she is not comfortable returning home and believes she will benefit from STR at a SNF before returning home. Pt voices she lives alone. Pt daughter resides in [place].  NCM sent referrals out for SNF placement, awaiting bed offers. PASRR Level II pending. IP Care Management continuing to follow.   Expected Discharge Plan:  (TBD) Barriers to Discharge: Continued Medical Work up               Expected Discharge Plan and Services In-house Referral: NA Discharge Planning Services: CM Consult Post Acute Care Choice: NA Living arrangements for the past 2 months: Single Family Home                 DME Arranged: N/A DME Agency: NA       HH Arranged: NA HH Agency: NA         Social Drivers of Health (SDOH) Interventions SDOH Screenings   Food Insecurity: No Food Insecurity (03/09/2024)  Housing: Low Risk  (03/09/2024)  Transportation Needs: No Transportation Needs (03/09/2024)  Utilities: Not At Risk (03/09/2024)  Depression (PHQ2-9): Low Risk  (03/05/2024)  Recent Concern: Depression (PHQ2-9) - Medium Risk (02/21/2024)  Financial Resource Strain: Low Risk  (10/09/2023)   Received from [facility]  Social Connections: Moderately Integrated (03/09/2024)  Tobacco Use: Medium Risk (03/09/2024)    Readmission Risk Interventions     No data to display

## 2024-03-14 NOTE — Telephone Encounter (Signed)
 Received call from pt. She remains in-patient on stepdown unit @ [facility]. She has been told that she can discharged home today. She is very concerned about this-that she is not ready and may want to go to rehab. Advised that I will notify Dr. Lafonda PA and our in-patient APP, Gracie Rouson to see her today. Pt has been asking about in patient IVIG as well. Notified Olam Brunner, NP and [place] police, PA-C

## 2024-03-14 NOTE — Progress Notes (Addendum)
 PROGRESS NOTE  [name] FMW:[id number] DOB: [birth year] DOA: 03/08/2024 PCP: Patient, [name]   LOS: 6 days   Brief narrative:  Ms. Jean Davidson is a 78 year old female with past medical history of relapsed/Refractory IgA Lambda Multiple Myeloma, In Remission, GERD, HTN, macular degeneration, cervical spinal stenosis presented to hospital with shortness of breath.  Patient was recently seen as outpatient for low-grade fever and was presumed to have UTI and received ciprofloxacin .  On this presentation she presented with progressive shortness of breath nonproductive cough and low-grade fever of 101.  In the ED, patient was slightly tachycardic and needed supplemental oxygen .  Chest x-ray did not show any infiltrate.  Subsequently CT angiogram of the chest was done which showed negative for PE but bilateral airspace disease consistent with multifocal pneumonia..  Initial labs were notable for WBC at 4.2 with 2% bands,on elranatamab .  COVID, flu, RSV negative.  Procalcitonin was negative.  Patient had elevated lactate on presentation with trended up to 5.3.  Received 3-1/2 L of IV fluids in the ED.  Continue vancomycin  cefepime  and Flagyl .  Given acute respiratory failure, patient also required BiPAP due to increased work of breathing and was admitted to the hospital for further evaluation and treatment.     Assessment/Plan: Principal Problem:   Severe sepsis (HCC) Active Problems:   Acute hypoxic respiratory failure (HCC)   CAP (community acquired pneumonia)   Multiple myeloma in remission (HCC)   Essential hypertension   Anemia due to antineoplastic chemotherapy   Diarrhea   GERD (gastroesophageal reflux disease)   Peripheral neuropathy due to chemotherapy (HCC)  Severe sepsis likely secondary to multifocal community-acquired pneumonia. Patient received vancomycin , cefepime  and Flagyl  during hospitalization and currently has completed the course.  PCCM and  ID were on board. Patient had not  been taking  atovaquone  at home due to taste (takes every couple days).  Currently on h nasal cannula at 2 L/min.   Acute hypoxic respiratory failure secondary to severe multifocal pneumonia and heart failure.  PE was ruled out.  Was initially on high flow nasal cannula and BiPA, Solu-Medrol .  PCCM followed the patient during hospitalization.  Patient has completed course of antibiotic and steroids at this time.   Currently on at 2 L/min.  2D echocardiogram was performed on 03/11/2024 with LV ejection fraction of 35 to 40% with no regional wall motion abnormality and grade 1 diastolic dysfunction.  Cardiology on board.  Acute systolic heart failure   New findings of LV ejection fraction of 35 to 40%.  Currently patient is on on 80 mg of IV Lasix  twice daily with good diuresis.  Patient is negative balance.  3031 mL so far and had total urinary output of 3452 mL in 24 hours.  Will continue to wean oxygen  as able.  Blood pressure marginally low so GDMT on hold.  On metoprolol  25 mg daily.    Relapsed multiple myeloma with acquired hypogammaglobinemia Follows up with Dr. Federico oncology as outpatient. - 2016: Underwent autologous stem cell transplant. 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 10/16/2022: Completed CAR-T therapy with [employer]ec[employer]. 09/2023: Started on Elrexfio  due to progression of disease. 01/02/2024: Last visit at [facility] where induction therapy was completed - Now on Elranatamab  (Elrexfio ); just had cycle 6 on 02/27/24 - Continue atovaquone  and acyclovir  Previous provider had spoken with Dr. Federico on 03/11/2024; patient due for IVIG on 03/12/2024.  Oncology on board.  Plan for IVIG as per oncology.   Anemia due to antineoplastic  chemotherapy Hemoglobin today at 9.0.  Continue to monitor   Essential hypertension Continue IV diuretics and metoprolol .  Latest blood pressure 109/65   Diarrhea. History of chronic diarrhea.  Was recently on ciprofloxacin .  Continue as needed  Imodium  Lomotil .    GERD (gastroesophageal reflux disease) Continue Protonix   Debility weakness.  Seen by physical therapy and recommend home health on discharge.  Acute urinary retention.  Required In-N-Out catheter x 2 yesterday.  On diuretics.  Will put the patient on Foley catheter for now.  Increase ambulation if necessary.  DVT prophylaxis: SCDs Start: 03/08/24 1347   Disposition: Home with home health likely in 1 to 2 days when clinically improved and okay with cardio/oncology.    Status is: Inpatient Remains inpatient appropriate because: Pending clinical improvement, IV diuresis, supplemental oxygen    Code Status:     Code Status: Do not attempt resuscitation (DNR) PRE-ARREST INTERVENTIONS DESIRED  Family Communication: None at bedside.  I tried to reach out to the patient's daughter on the phone multiple times but was unable to reach out to today.  Consultants: PCCM Cardiology Oncology  Procedures: BiPAP  Anti-infectives:  Acyclovir , atovaquone   Anti-infectives (From admission, onward)    Start     Dose/Rate Route Frequency Ordered Stop   03/10/24 1100  azithromycin  (ZITHROMAX ) 500 mg in sodium chloride  0.9 % 250 mL IVPB        500 mg 250 mL/hr over 60 Minutes Intravenous Every 24 hours 03/10/24 0928 03/12/24 1120   03/09/24 1200  vancomycin  (VANCOCIN ) IVPB 1000 mg/200 mL premix  Status:  Discontinued        1,000 mg 200 mL/hr over 60 Minutes Intravenous Every 24 hours 03/08/24 1554 03/10/24 2102   03/09/24 0000  ceFEPIme  (MAXIPIME ) 2 g in sodium chloride  0.9 % 100 mL IVPB        2 g 200 mL/hr over 30 Minutes Intravenous Every 12 hours 03/08/24 1554 03/12/24 1233   03/08/24 2200  metroNIDAZOLE  (FLAGYL ) IVPB 500 mg        500 mg 100 mL/hr over 60 Minutes Intravenous Every 12 hours 03/08/24 1523 03/12/24 2219   03/08/24 2200  acyclovir  (ZOVIRAX ) tablet 400 mg        400 mg Oral 2 times daily 03/08/24 1643     03/08/24 2200  atovaquone  (MEPRON ) 750 MG/5ML  suspension 1,500 mg        1,500 mg Oral 2 times daily 03/08/24 1846     03/08/24 1115  ceFEPIme  (MAXIPIME ) 2 g in sodium chloride  0.9 % 100 mL IVPB        2 g 200 mL/hr over 30 Minutes Intravenous  Once 03/08/24 1112 03/08/24 1408   03/08/24 1115  metroNIDAZOLE  (FLAGYL ) IVPB 500 mg        500 mg 100 mL/hr over 60 Minutes Intravenous  Once 03/08/24 1112 03/08/24 1302   03/08/24 1115  vancomycin  (VANCOCIN ) IVPB 1000 mg/200 mL premix        1,000 mg 200 mL/hr over 60 Minutes Intravenous  Once 03/08/24 1112 03/08/24 1533       Subjective: Today, patient was seen and examined at bedside.  Patient stated that she had difficulty urinating yesterday and required In-N-Out catheter twice.  Denies any nausea, vomiting, fever, chills or rigor.  Complains of generalized fatigue and weakness.    Objective: Vitals:   03/14/24 1200 03/14/24 1301  BP: 109/65   Pulse: (!) 104   Resp: (!) 24   Temp:  98.6 F (37 C)  SpO2: 93%     Intake/Output Summary (Last 24 hours) at 03/14/2024 1325 Last data filed at 03/14/2024 1100 Gross per 24 hour  Intake --  Output 3052 ml  Net -3052 ml   Filed Weights   03/08/24 1106 03/11/24 1325 03/13/24 1626  Weight: 72.6 kg 76.1 kg 74.1 kg   Body mass index is 29.88 kg/m.   Physical Exam: GENERAL: Patient is alert awake and oriented.  On 2 L of oxygen  by nasal cannula communicative.  Elderly female, HENT: Mild pallor noted.  Pupils equally reactive to light. Oral mucosa is moist NECK: is supple, no gross swelling noted. CHEST: Breath sounds decreased bilaterally CVS: S1 and S2 heard, no murmur. Regular rate and rhythm.  ABDOMEN: Soft, non-tender, bowel sounds are present. EXTREMITIES: Trace bilateral lower extremity edema CNS: Cranial nerves are intact. No focal motor deficits. SKIN: warm and dry without rashes.  Data Review: I have personally reviewed the following laboratory data and studies,  CBC: Recent Labs  Lab 03/09/24 0523 03/10/24 0258  03/11/24 0430 03/12/24 0308 03/13/24 0312  WBC 5.4 6.2 7.2 5.1 4.1  NEUTROABS 3.2 4.9 5.1 4.3 2.7  HGB 9.6* 8.7* 8.7* 8.9* 9.0*  HCT 31.9* 27.5* 28.2* 29.7* 29.3*  MCV 105.3* 105.0* 104.4* 106.1* 105.8*  PLT 105* 97* 103* 102* 99*   Basic Metabolic Panel: Recent Labs  Lab 03/10/24 0258 03/11/24 0430 03/11/24 1333 03/12/24 0308 03/13/24 0312 03/14/24 0310  NA 139 143 141 144 143 142  K 3.8 3.2* 3.2* 4.3 3.7 3.5  CL 106 105 104 106 100 94*  CO2 24 27 24 26  32 33*  GLUCOSE 178* 153* 186* 144* 131* 133*  BUN 12 20 22 21 21  24*  CREATININE 0.57 0.69 0.71 0.63 0.74 0.74  CALCIUM  8.8* 8.7* 8.9 8.8* 8.5* 9.0  MG 2.3 2.1 2.0 2.1 1.8  --    Liver Function Tests: Recent Labs  Lab 03/08/24 1138  AST 41  ALT 12  ALKPHOS 99  BILITOT 0.3  PROT 6.4*  ALBUMIN 4.1   No results for input(s): LIPASE, AMYLASE in the last 168 hours. No results for input(s): AMMONIA in the last 168 hours. Cardiac Enzymes: No results for input(s): CKTOTAL, CKMB, CKMBINDEX, TROPONINI in the last 168 hours. BNP (last 3 results) No results for input(s): BNP in the last 8760 hours.  ProBNP (last 3 results) Recent Labs    03/10/24 0258  PROBNP 16,972.0*    CBG: Recent Labs  Lab 03/13/24 1126 03/13/24 1615 03/13/24 2203 03/14/24 0819 03/14/24 1148  GLUCAP 129* 189* 150* 134* 200*   Recent Results (from the past 240 hours)  Culture, blood (Routine X 2) w Reflex to ID Panel     Status: None   Collection Time: 03/05/24  9:41 AM   Specimen: BLOOD RIGHT HAND  Result Value Ref Range Status   Specimen Description   Final    BLOOD RIGHT HAND Performed at [facility], [address] [place], [place] [address]    Special Requests   Final    NONE Performed at [facility], [address], [place], [place] [address]    Culture   Final    NO GROWTH 5 DAYS Performed at [facility], [address] [place], [place]  [address]    Report Status 03/10/2024 FINAL  Final  Resp panel by RT-PCR (RSV, Flu A&B, Covid) Anterior Nasal Swab     Status: None   Collection Time: 03/05/24  10:10 AM   Specimen: Anterior Nasal Swab  Result Value Ref Range Status   SARS Coronavirus 2 by RT PCR NEGATIVE NEGATIVE Final    Comment: (NOTE) SARS-CoV-2 target nucleic acids are NOT DETECTED.  The SARS-CoV-2 RNA is generally detectable in upper respiratory specimens during the acute phase of infection. The lowest concentration of SARS-CoV-2 viral copies this assay can detect is 138 copies/mL. A negative result does not preclude SARS-Cov-2 infection and should not be used as the sole basis for treatment or other patient management decisions. A negative result may occur with  improper specimen collection/handling, submission of specimen other than nasopharyngeal swab, presence of viral mutation(s) within the areas targeted by this assay, and inadequate number of viral copies(<138 copies/mL). A negative result must be combined with clinical observations, patient history, and epidemiological information. The expected result is Negative.  Fact Sheet for Patients:  BloggerCourse.com  Fact Sheet for Healthcare Providers:  SeriousBroker.it  This test is no t yet approved or cleared by the [place]  FDA and  has been authorized for detection and/or diagnosis of SARS-CoV-2 by FDA under an Emergency Use Authorization (EUA). This EUA will remain  in effect (meaning this test can be used) for the duration of the COVID-19 declaration under Section 564(b)(1) of the Act, 21 U.S.C.section 360bbb-3(b)(1), unless the authorization is terminated  or revoked sooner.       Influenza A by PCR NEGATIVE NEGATIVE Final   Influenza B by PCR NEGATIVE NEGATIVE Final    Comment: (NOTE) The Xpert Xpress SARS-CoV-2/FLU/RSV plus assay is intended as an aid in the diagnosis of influenza from  Nasopharyngeal swab specimens and should not be used as a sole basis for treatment. Nasal washings and aspirates are unacceptable for Xpert Xpress SARS-CoV-2/FLU/RSV testing.  Fact Sheet for Patients: BloggerCourse.com  Fact Sheet for Healthcare Providers: SeriousBroker.it  This test is not yet approved or cleared by the [place]  FDA and has been authorized for detection and/or diagnosis of SARS-CoV-2 by FDA under an Emergency Use Authorization (EUA). This EUA will remain in effect (meaning this test can be used) for the duration of the COVID-19 declaration under Section 564(b)(1) of the Act, 21 U.S.C. section 360bbb-3(b)(1), unless the authorization is terminated or revoked.     Resp Syncytial Virus by PCR NEGATIVE NEGATIVE Final    Comment: (NOTE) Fact Sheet for Patients: BloggerCourse.com  Fact Sheet for Healthcare Providers: SeriousBroker.it  This test is not yet approved or cleared by the [place]  FDA and has been authorized for detection and/or diagnosis of SARS-CoV-2 by FDA under an Emergency Use Authorization (EUA). This EUA will remain in effect (meaning this test can be used) for the duration of the COVID-19 declaration under Section 564(b)(1) of the Act, 21 U.S.C. section 360bbb-3(b)(1), unless the authorization is terminated or revoked.  Performed at [facility], [address] [place], [place] [address]   Culture, blood (Routine X 2) w Reflex to ID Panel     Status: None   Collection Time: 03/05/24 10:21 AM   Specimen: BLOOD LEFT HAND  Result Value Ref Range Status   Specimen Description   Final    BLOOD LEFT HAND Performed at [facility], [address] [place], [place] [address]    Special Requests   Final    NONE Performed at [facility], [address], [place],  [place] [address]    Culture   Final    NO GROWTH 5 DAYS  Performed at [facility], [address], [place], [place] [address]    Report Status 03/10/2024 FINAL  Final  Urine Culture     Status: Abnormal   Collection Time: 03/05/24  3:12 PM   Specimen: Urine, Clean Catch  Result Value Ref Range Status   Specimen Description   Final    URINE, CLEAN CATCH Performed at [facility], [address] [place], [place] [address]    Special Requests   Final    NONE Performed at [facility], [address] [place] [address] [place], [place] [address]    Culture MULTIPLE SPECIES PRESENT, SUGGEST RECOLLECTION (A)  Final   Report Status 03/06/2024 FINAL  Final  Resp panel by RT-PCR (RSV, Flu A&B, Covid) Anterior Nasal Swab     Status: None   Collection Time: 03/08/24 11:38 AM   Specimen: Anterior Nasal Swab  Result Value Ref Range Status   SARS Coronavirus 2 by RT PCR NEGATIVE NEGATIVE Final    Comment: (NOTE) SARS-CoV-2 target nucleic acids are NOT DETECTED.  The SARS-CoV-2 RNA is generally detectable in upper respiratory specimens during the acute phase of infection. The lowest concentration of SARS-CoV-2 viral copies this assay can detect is 138 copies/mL. A negative result does not preclude SARS-Cov-2 infection and should not be used as the sole basis for treatment or other patient management decisions. A negative result may occur with  improper specimen collection/handling, submission of specimen other than nasopharyngeal swab, presence of viral mutation(s) within the areas targeted by this assay, and inadequate number of viral copies(<138 copies/mL). A negative result must be combined with clinical observations, patient history, and epidemiological information. The expected result is Negative.  Fact Sheet for Patients:  BloggerCourse.com  Fact Sheet for Healthcare Providers:   SeriousBroker.it  This test is no t yet approved or cleared by the [place]  FDA and  has been authorized for detection and/or diagnosis of SARS-CoV-2 by FDA under an Emergency Use Authorization (EUA). This EUA will remain  in effect (meaning this test can be used) for the duration of the COVID-19 declaration under Section 564(b)(1) of the Act, 21 U.S.C.section 360bbb-3(b)(1), unless the authorization is terminated  or revoked sooner.       Influenza A by PCR NEGATIVE NEGATIVE Final   Influenza B by PCR NEGATIVE NEGATIVE Final    Comment: (NOTE) The Xpert Xpress SARS-CoV-2/FLU/RSV plus assay is intended as an aid in the diagnosis of influenza from Nasopharyngeal swab specimens and should not be used as a sole basis for treatment. Nasal washings and aspirates are unacceptable for Xpert Xpress SARS-CoV-2/FLU/RSV testing.  Fact Sheet for Patients: BloggerCourse.com  Fact Sheet for Healthcare Providers: SeriousBroker.it  This test is not yet approved or cleared by the [place]  FDA and has been authorized for detection and/or diagnosis of SARS-CoV-2 by FDA under an Emergency Use Authorization (EUA). This EUA will remain in effect (meaning this test can be used) for the duration of the COVID-19 declaration under Section 564(b)(1) of the Act, 21 U.S.C. section 360bbb-3(b)(1), unless the authorization is terminated or revoked.     Resp Syncytial Virus by PCR NEGATIVE NEGATIVE Final    Comment: (NOTE) Fact Sheet for Patients: BloggerCourse.com  Fact Sheet for Healthcare Providers: SeriousBroker.it  This test is not yet approved or cleared by the [place]  FDA and has been authorized for detection and/or diagnosis of SARS-CoV-2 by FDA under an Emergency Use Authorization (EUA). This EUA will remain in effect (meaning this  test can be used) for  the duration of the COVID-19 declaration under Section 564(b)(1) of the Act, 21 U.S.C. section 360bbb-3(b)(1), unless the authorization is terminated or revoked.  Performed at [facility], [address] [place], [place] [address]   Respiratory (~20 pathogens) panel by PCR     Status: None   Collection Time: 03/08/24 11:38 AM   Specimen: Nasopharyngeal Swab; Respiratory  Result Value Ref Range Status   Adenovirus NOT DETECTED NOT DETECTED Final   Coronavirus 229E NOT DETECTED NOT DETECTED Final    Comment: (NOTE) The Coronavirus on the Respiratory Panel, DOES NOT test for the novel  Coronavirus (2019 nCoV)    Coronavirus HKU1 NOT DETECTED NOT DETECTED Final   Coronavirus NL63 NOT DETECTED NOT DETECTED Final   Coronavirus OC43 NOT DETECTED NOT DETECTED Final   Metapneumovirus NOT DETECTED NOT DETECTED Final   Rhinovirus / Enterovirus NOT DETECTED NOT DETECTED Final   Influenza A NOT DETECTED NOT DETECTED Final   Influenza B NOT DETECTED NOT DETECTED Final   Parainfluenza Virus 1 NOT DETECTED NOT DETECTED Final   Parainfluenza Virus 2 NOT DETECTED NOT DETECTED Final   Parainfluenza Virus 3 NOT DETECTED NOT DETECTED Final   Parainfluenza Virus 4 NOT DETECTED NOT DETECTED Final   Respiratory Syncytial Virus NOT DETECTED NOT DETECTED Final   Bordetella pertussis NOT DETECTED NOT DETECTED Final   Bordetella Parapertussis NOT DETECTED NOT DETECTED Final   Chlamydophila pneumoniae NOT DETECTED NOT DETECTED Final   Mycoplasma pneumoniae NOT DETECTED NOT DETECTED Final    Comment: Performed at [facility], [address], [place], [place] [address]  Blood Culture (routine x 2)     Status: None   Collection Time: 03/08/24  1:34 PM   Specimen: BLOOD LEFT ARM  Result Value Ref Range Status   Specimen Description   Final    BLOOD LEFT ARM Performed at [facility], [address], [place], [place] [address]    Special Requests   Final    BOTTLES DRAWN  AEROBIC ONLY Blood Culture results may not be optimal due to an inadequate volume of blood received in culture bottles Performed at [facility], [address], [place], [place] [address]    Culture   Final    NO GROWTH 5 DAYS Performed at [facility], [address], [place], [place] [address]    Report Status 03/13/2024 FINAL  Final  Blood Culture (routine x 2)     Status: None   Collection Time: 03/08/24  1:58 PM   Specimen: BLOOD LEFT ARM  Result Value Ref Range Status   Specimen Description   Final    BLOOD LEFT ARM Performed at [facility], [address], [place], [place] [address]    Special Requests   Final    BOTTLES DRAWN AEROBIC AND ANAEROBIC Blood Culture adequate volume Performed at [facility], [address], [place], [place] [address]    Culture   Final    NO GROWTH 5 DAYS Performed at [facility], [address], [place], [place] [address]    Report Status 03/13/2024 FINAL  Final  MRSA Next Gen by PCR, Nasal     Status: None   Collection Time: 03/10/24 10:53 AM   Specimen: Nasal Mucosa; Nasal Swab  Result Value Ref Range Status   MRSA by PCR Next Gen NOT DETECTED NOT DETECTED Final    Comment: (NOTE) The  GeneXpert MRSA Assay (FDA approved for NASAL specimens only), is one component of a comprehensive MRSA colonization surveillance program. It is not intended to diagnose MRSA infection nor to guide or monitor treatment for MRSA infections. Test performance is not FDA approved in patients less than 4 years old. Performed at [facility], [address] [place], [place] [address]      Studies: No results found.     August Gosser, MD  [facility] 03/14/2024  If 7PM-7AM, please contact night-coverage

## 2024-03-14 NOTE — Progress Notes (Signed)
 Occupational Therapy Treatment Patient Details Name: Jean Davidson MRN: [id number] DOB: 1945/08/14 Today's Date: 03/14/2024   History of present illness Jean Davidson is a 78 year old female admitted to the hospital with CAP, acute systolic heart failure and sepsis. PMH: relapsed/Refractory IgA Lambda Multiple Myeloma, In Remission, GERD, HTN, macular degeneration, cervical spinal stenosis   OT comments  The pt was seen for functional strengthening, facilitation of progressive activity, and promotion of out of bed tolerance, needed to prep her for out of bed ADL participation. She required assist for supine to, sit to stand using a RW, and to perform a step-pivot transfer to the chair. She was subsequently instructed on therapeutic exercises for strengthening and improved activity tolerance needed to optimize her ADL performance. She reported some fatigue with activity. She indicated she was initially hoping to return home with home health therapy and hired assistance, however she was unsuccessful at setting up in-home assistance. As such, she is now interested in short-term SNF rehab. Continue OT plan of care. Patient will benefit from continued inpatient follow up therapy, <3 hours/day.       If plan is discharge home, recommend the following:  Assistance with cooking/housework;Assist for transportation;Help with stairs or ramp for entrance;A little help with walking and/or transfers;A lot of help with bathing/dressing/bathroom   Equipment Recommendations  Other (comment)    Recommendations for Other Services      Precautions / Restrictions Restrictions Weight Bearing Restrictions Per Provider Order: No       Mobility Bed Mobility Overal bed mobility: Needs Assistance Bed Mobility: Supine to Sit     Supine to sit: Min assist, HOB elevated, Used rails          Transfers Overall transfer level: Needs assistance Equipment used: Rolling walker (2 wheels) Transfers: Sit to/from  Stand Sit to Stand: Min assist     Step pivot transfers: Min assist           Balance     Sitting balance-Leahy Scale: Fair       Standing balance-Leahy Scale:  (CGA to min assist with RW)          ADL either performed or assessed with clinical judgement              Communication Communication Communication: No apparent difficulties   Cognition Arousal: Alert Behavior During Therapy: WFL for tasks assessed/performed Cognition: No apparent impairments        Following commands: Intact        Cueing   Cueing Techniques: Verbal cues             Pertinent Vitals/ Pain       Pain Assessment Pain Assessment: No/denies pain   Frequency  Min 2X/week        Progress Toward Goals  OT Goals(current goals can now be found in the care plan section)     Acute Rehab OT Goals Patient Stated Goal: the pt desires short-term SNF rehab at discharge, to help maximize her functional independence prior to her return home OT Goal Formulation: With patient Time For Goal Achievement: 03/27/24 Potential to Achieve Goals: Good  Plan         AM-PAC OT 6 Clicks Daily Activity     Outcome Measure   Help from another person eating meals?: None Help from another person taking care of personal grooming?: A Little Help from another person toileting, which includes using toliet, bedpan, or urinal?: A Lot Help from another person bathing (including washing, rinsing,  drying)?: A Lot Help from another person to put on and taking off regular upper body clothing?: A Little Help from another person to put on and taking off regular lower body clothing?: A Lot 6 Click Score: 16    End of Session Equipment Utilized During Treatment: Rolling walker (2 wheels);Oxygen   OT Visit Diagnosis: Unsteadiness on feet (R26.81);Muscle weakness (generalized) (M62.81);Other abnormalities of gait and mobility (R26.89)   Activity Tolerance Other (comment) (fair+ tolerance)   Patient  Left in chair;with call bell/phone within reach;with family/visitor present   Nurse Communication Mobility status        Time: 8354-8297 OT Time Calculation (min): 17 min  Charges: OT General Charges $OT Visit: 1 Visit OT Treatments $Therapeutic Activity: 8-22 mins     Delanna JINNY Lesches, OTR/L 03/14/2024, 5:39 PM

## 2024-03-14 NOTE — [place] FL2 (Signed)
 [place]  MEDICAID FL2 LEVEL OF CARE FORM     IDENTIFICATION  Patient Name: Jean Davidson Birthdate: [birth year] Sex: female Admission Date (Current Location): 03/08/2024  [facility] and [place] Number:  Producer, television/film/video and Address:  [facility],  [address] [place], [place] [address]      Provider Number: [id number]  Attending Physician Name and Address:  Sonjia Held, MD  Relative Name and Phone Number:  Caretha, Rumbaugh (Daughter)  [phone]    Current Level of Care: Hospital Recommended Level of Care: Skilled Nursing Facility Prior Approval Number:    Date Approved/Denied:   PASRR Number: Pending  Discharge Plan: SNF    Current Diagnoses: Patient Active Problem List   Diagnosis Date Noted   CAP (community acquired pneumonia) 03/09/2024   Severe sepsis (HCC) 03/08/2024   Acute hypoxic respiratory failure (HCC) 03/08/2024   Deficiency anemia 09/07/2023   Closed left ankle fracture 06/16/2022   Fluid overload 04/17/2022   Anemia due to antineoplastic chemotherapy 06/24/2021   Sinusitis 06/13/2021   Neutropenic fever (HCC) 04/05/2021   Hyperglycemia, drug-induced 07/29/2020   Financial difficulties 05/27/2020   Bilateral cataracts 12/04/2019   Headache disorder 09/11/2019   Weight gain 09/11/2019   Skin lesion of back 08/07/2019   Pelvic floor dysfunction in female 07/09/2019   Alopecia 07/09/2019   Squamous cell cancer of skin of left temple 06/04/2019   Exertional shortness of breath 02/14/2019   Vitamin B12 deficiency 09/25/2018   Large hiatal hernia 07/26/2018   Belching 07/26/2018   Early satiety 07/26/2018   URI (upper respiratory infection) 06/25/2018   Lung nodule seen on imaging study 06/25/2018   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/11/2018   Tinnitus of both ears 04/10/2018   Preventive measure 04/10/2018   Goals of care, counseling/discussion 02/20/2018   Pain of left scapula 12/06/2017   Vitamin D  deficiency  11/12/2017   Yeast infection of the skin 09/06/2017   Family hx of colon cancer requiring screening colonoscopy 01/17/2017   Cystocele and rectocele with incomplete uterovaginal prolapse 11/22/2016   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   Bilateral pleural effusion 01/25/2016   Pericardial effusion 01/24/2016   Physical deconditioning 01/11/2016   S/P autologous bone marrow transplantation (HCC) 11/08/2015   Therapeutic drug monitoring 11/08/2015   Red blood cell antibody positive, compatible PRBC difficult to obtain 07/30/2015   Cough 07/27/2015   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   Fatigue 01/26/2015   Essential hypertension 01/26/2015   Other fatigue 01/26/2015   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/2015   Chronic recurrent sinusitis 10/23/2014   Conjunctivitis 10/23/2014   Status post allogeneic bone marrow transplant (HCC) 08/14/2014   Leukopenia due to antineoplastic chemotherapy (HCC) 08/10/2014   Patient in clinical research study 07/21/2014   Need for prophylactic vaccination and inoculation against influenza 02/24/2014   Diarrhea 02/03/2014   Leukocytosis 02/03/2014   Constipation 11/21/2013   Rash 11/21/2013   Back pain 11/21/2013   Anxiety 11/21/2013   Multiple myeloma in remission (HCC) 11/07/2013   Pancytopenia, acquired (HCC) 10/29/2013   Thrombocytopenia (HCC) 10/29/2013   Persistent cough for 3 weeks or longer 10/02/2013   Abnormal weight loss 10/02/2013   Normocytic anemia 08/22/2013   Abdominal bloating 08/22/2013   Hypersensitivity 08/07/2013   Other malaise and fatigue 08/07/2013   Osteopenia 08/05/2013   GERD (gastroesophageal reflux disease) 01/16/2013   Insomnia disorder 01/16/2013   Carotid stenosis    Cervical disc disease    Hypertension 04/15/2012   Palpitations 03/29/2012  Orientation RESPIRATION BLADDER Height & Weight     Self, Time, Situation, Place  O2 (2L O2) Indwelling catheter Weight:  74.1 kg Height:  5' 2 (157.5 cm)  BEHAVIORAL SYMPTOMS/MOOD NEUROLOGICAL BOWEL NUTRITION STATUS      Continent Diet (Regular)  AMBULATORY STATUS COMMUNICATION OF NEEDS Skin   Limited Assist Verbally Normal                       Personal Care Assistance Level of Assistance  Bathing, Feeding, Dressing Bathing Assistance: Limited assistance Feeding assistance: Limited assistance Dressing Assistance: Limited assistance     Functional Limitations Info  Hearing, Speech, Sight Sight Info: Impaired Hearing Info: Adequate Speech Info: Adequate    SPECIAL CARE FACTORS FREQUENCY  PT (By licensed PT), OT (By licensed OT)     PT Frequency: 5x/wk OT Frequency: 5x/wk            Contractures Contractures Info: Not present    Additional Factors Info  Code Status, Allergies Code Status Info: DNR Allergies Info: No Known Allergies           Current Medications (03/14/2024):  This is the current hospital active medication list Current Facility-Administered Medications  Medication Dose Route Frequency Provider Last Rate Last Admin   acetaminophen  (TYLENOL ) tablet 650 mg  650 mg Oral Q6H PRN Foust, Katy L, NP       Or   acetaminophen  (TYLENOL ) suppository 650 mg  650 mg Rectal Q6H PRN Foust, Katy L, NP       acyclovir  (ZOVIRAX ) tablet 400 mg  400 mg Oral BID Foust, Katy L, NP   400 mg at 03/14/24 0953   antiseptic oral rinse (BIOTENE) solution 15 mL  15 mL Mouth Rinse PRN Daniels, James K, NP       aspirin  EC tablet 81 mg  81 mg Oral Daily Foust, Katy L, NP   81 mg at 03/14/24 9046   atovaquone  (MEPRON ) 750 MG/5ML suspension 1,500 mg  1,500 mg Oral BID Patsy Lenis, MD   1,500 mg at 03/14/24 9046   calcium  carbonate (TUMS - dosed in mg elemental calcium ) chewable tablet 400 mg of elemental calcium   2 tablet Oral TID PRN Patsy Lenis, MD   400 mg of elemental calcium  at 03/12/24 1158   Chlorhexidine  Gluconate Cloth 2 % PADS 6 each  6 each Topical Daily Patsy Lenis, MD   6  each at 03/13/24 1208   cholecalciferol  (VITAMIN D3) 25 MCG (1000 UNIT) tablet 4,000 Units  4,000 Units Oral Daily Foust, Katy L, NP   4,000 Units at 03/14/24 9047   diphenoxylate -atropine  (LOMOTIL ) 2.5-0.025 MG per tablet 1 tablet  1 tablet Oral QID PRN Patsy Lenis, MD       furosemide  (LASIX ) injection 80 mg  80 mg Intravenous BID Garrick Christians N, PA-C   80 mg at 03/14/24 9096   guaiFENesin -dextromethorphan  (ROBITUSSIN DM) 100-10 MG/5ML syrup 5 mL  5 mL Oral Q4H PRN Patsy Lenis, MD       hydrALAZINE  (APRESOLINE ) tablet 25 mg  25 mg Oral Q4H PRN Patsy Lenis, MD       insulin  aspart (novoLOG ) injection 0-6 Units  0-6 Units Subcutaneous TID AC & HS Patsy Lenis, MD   1 Units at 03/13/24 1635   labetalol  (NORMODYNE ) injection 10 mg  10 mg Intravenous Q4H PRN Patsy Lenis, MD       levalbuterol  (XOPENEX ) nebulizer solution 0.63 mg  0.63 mg Nebulization Q4H PRN Foust, Rockie CROME, NP  0.63 mg at 03/08/24 2018   loperamide  (IMODIUM ) capsule 2 mg  2 mg Oral Q4H PRN Patsy Lenis, MD   2 mg at 03/13/24 1635   melatonin tablet 5 mg  5 mg Oral QHS PRN Foust, Katy L, NP   5 mg at 03/09/24 2138   metoprolol  succinate (TOPROL -XL) 24 hr tablet 25 mg  25 mg Oral Daily Goodrich, Callie E, PA-C   25 mg at 03/14/24 9047   metoprolol  tartrate (LOPRESSOR ) injection 5 mg  5 mg Intravenous Q2H PRN Patsy Lenis, MD   5 mg at 03/10/24 1155   mirtazapine  (REMERON ) tablet 15 mg  15 mg Oral QHS Girguis, David, MD   15 mg at 03/13/24 2013   morphine  (PF) 2 MG/ML injection 2 mg  2 mg Intravenous Q2H PRN Patsy Lenis, MD   2 mg at 03/09/24 1019   multivitamin (PROSIGHT) tablet 2 tablet  2 tablet Oral BID Patsy Lenis, MD   2 tablet at 03/14/24 9047   Muscle Rub CREA 1 Application  1 Application Topical PRN Chavez, Abigail, NP   1 Application at 03/13/24 2204   ondansetron  (ZOFRAN ) tablet 4 mg  4 mg Oral Q6H PRN Foust, Katy L, NP       Or   ondansetron  (ZOFRAN ) injection 4 mg  4 mg Intravenous Q6H PRN Foust,  Katy L, NP   4 mg at 03/12/24 1159   Oral care mouth rinse  15 mL Mouth Rinse PRN Patsy Lenis, MD       pantoprazole  (PROTONIX ) EC tablet 40 mg  40 mg Oral Daily Foust, Katy L, NP   40 mg at 03/14/24 0953   polyethylene glycol (MIRALAX  / GLYCOLAX ) packet 17 g  17 g Oral Daily PRN Foust, Katy L, NP       saccharomyces boulardii (FLORASTOR) capsule 250 mg  250 mg Oral BID Daniels, James K, NP   250 mg at 03/14/24 9046   sodium chloride  flush (NS) 0.9 % injection 10-40 mL  10-40 mL Intracatheter PRN Patsy Lenis, MD         Discharge Medications: Please see discharge summary for a list of discharge medications.  Relevant Imaging Results:  Relevant Lab Results:   Additional Information SSN: [id number]  Jon ONEIDA Anon, RN

## 2024-03-14 NOTE — Progress Notes (Addendum)
 Rounding Note   Patient Name: Jean Davidson Date of Encounter: 03/14/2024  [facility] Cardiologist: Deatrice Cage, MD   Subjective Patient more frustrated this morning due to issues with catheter. She states she had to ask multiple times yesterday/ morning for Foley catheter to be placed due to urinary retention rather than doing an in and out cath. Foley was ultimately placed this morning. She is also frustrated because she feels like she is shaking and is not getting answers about this. No visible shaking on exam. Breathing much improved though. Down to 2L of O2 via nasal cannula. No chest pain. No significant palpitations.  Scheduled Meds:  acyclovir   400 mg Oral BID   aspirin  EC  81 mg Oral Daily   atovaquone   1,500 mg Oral BID   Chlorhexidine  Gluconate Cloth  6 each Topical Daily   cholecalciferol   4,000 Units Oral Daily   furosemide   80 mg Intravenous BID   insulin  aspart  0-6 Units Subcutaneous TID AC & HS   metoprolol  succinate  12.5 mg Oral Daily   mirtazapine   15 mg Oral QHS   multivitamin  2 tablet Oral BID   pantoprazole   40 mg Oral Daily   saccharomyces boulardii  250 mg Oral BID   Continuous Infusions:  PRN Meds: acetaminophen  **OR** acetaminophen , antiseptic oral rinse, calcium  carbonate, diphenoxylate -atropine , guaiFENesin -dextromethorphan , hydrALAZINE , labetalol , levalbuterol , loperamide , melatonin, metoprolol  tartrate, morphine  injection, Muscle Rub, ondansetron  **OR** ondansetron  (ZOFRAN ) IV, mouth rinse, polyethylene glycol, sodium chloride  flush   Vital Signs  Vitals:   03/14/24 0400 03/14/24 0500 03/14/24 0600 03/14/24 0800  BP: 123/62 (!) 103/53 (!) 104/57 (!) 134/100  Pulse: 93 96 92 98  Resp: 18 13 13 12   Temp: 98.3 F (36.8 C)     TempSrc: Axillary     SpO2: 95% 95% 95% 92%  Weight:      Height:        Intake/Output Summary (Last 24 hours) at 03/14/2024 0830 Last data filed at 03/14/2024 0335 Gross per 24 hour  Intake --  Output  2952 ml  Net -2952 ml      03/13/2024    4:26 PM 03/11/2024    1:25 PM 03/08/2024   11:06 AM  Last 3 Weights  Weight (lbs) 163 lb 5.8 oz 167 lb 12.3 oz 160 lb  Weight (kg) 74.1 kg 76.1 kg 72.576 kg      Telemetry Sinus rhythm with rates in the 90s to low 100s. - Personally Reviewed  ECG  No new ECG tracing today. - Personally Reviewed  Physical Exam  GEN: Caucasian female resting comfortably in no acute distress.  On 2L of O2 via nasal cannula. Neck: No JVD. Cardiac: Tachycardic with regular rhythm. No murmurs, rubs, or gallops.  Respiratory: No increased work of breathing. Faint crackles noted in bilateral bases.  MS: No edema; No deformity. Neuro:  No to trace edema of bilateral lower extremities. Psych: Normal affect   Labs High Sensitivity Troponin:  No results for input(s): TROPONINIHS in the last 720 hours.   Chemistry Recent Labs  Lab 03/08/24 1138 03/08/24 1216 03/11/24 1333 03/12/24 0308 03/13/24 0312 03/14/24 0310  NA 141   < > 141 144 143 142  K 3.6   < > 3.2* 4.3 3.7 3.5  CL 104   < > 104 106 100 94*  CO2 22   < > 24 26 32 33*  GLUCOSE 95   < > 186* 144* 131* 133*  BUN 11   < >  22 21 21  24*  CREATININE 0.78   < > 0.71 0.63 0.74 0.74  CALCIUM  9.4   < > 8.9 8.8* 8.5* 9.0  MG  --    < > 2.0 2.1 1.8  --   PROT 6.4*  --   --   --   --   --   ALBUMIN 4.1  --   --   --   --   --   AST 41  --   --   --   --   --   ALT 12  --   --   --   --   --   ALKPHOS 99  --   --   --   --   --   BILITOT 0.3  --   --   --   --   --   GFRNONAA >60   < > >60 >60 >60 >60  ANIONGAP 15   < > 14 12 12 15    < > = values in this interval not displayed.    Lipids No results for input(s): CHOL, TRIG, HDL, LABVLDL, LDLCALC, CHOLHDL in the last 168 hours.  Hematology Recent Labs  Lab 03/11/24 0430 03/12/24 0308 03/13/24 0312  WBC 7.2 5.1 4.1  RBC 2.70* 2.80* 2.77*  HGB 8.7* 8.9* 9.0*  HCT 28.2* 29.7* 29.3*  MCV 104.4* 106.1* 105.8*  MCH 32.2 31.8 32.5   MCHC 30.9 30.0 30.7  RDW 14.4 14.5 14.5  PLT 103* 102* 99*   Thyroid  No results for input(s): TSH, FREET4 in the last 168 hours.  BNP Recent Labs  Lab 03/10/24 0258  PROBNP 16,972.0*    DDimer  Recent Labs  Lab 03/08/24 1855  DDIMER 1.99*     Radiology  No results found.  Cardiac Studies Echocardiogram 03/11/2024: Impressions: 1. Left ventricular ejection fraction, by estimation, is 35 to 40%. The  left ventricle has moderately decreased function. The left ventricle has  no regional wall motion abnormalities. Left ventricular diastolic  parameters are consistent with Grade I  diastolic dysfunction (impaired relaxation).   2. Right ventricular systolic function is normal. The right ventricular  size is normal. There is mildly elevated pulmonary artery systolic  pressure.   3. The mitral valve is normal in structure. Moderate mitral valve  regurgitation. No evidence of mitral stenosis.   4. The aortic valve is normal in structure. Aortic valve regurgitation is  not visualized. No aortic stenosis is present.   5. The inferior vena cava is normal in size with greater than 50%  respiratory variability, suggesting right atrial pressure of 3 mmHg.    Patient Profile   78 y.o. female with a history of paroxysmal SVT, remote large pericardial effusion (resolved without intervention), mild carotid stenosis, hypertension, GERD, chronic anemia, relapsed/ refractory multiple myeloma on Elrexfio  who was admitted on 03/08/2024 for severe sepsis and acute hypoxic respiratory failure secondary to pneumonia. However, work-up also revealed markedly elevated pro-BNP and reduced EF. Cardiology consulted on 03/12/2024 for evaluation of acute CHF.  Assessment & Plan   Acute HFrEF Pro BNP markedly elevated at 16,972. Chest x-ray showed now acute findings. Chest CTA negative for PE but showed extensive bilateral airspace infiltrates consistent with multifocal infection or aspiration as well  as small bilateral pleural effusions. Echo showed LVEF of 35-40% with no regional wall motion abnormalities and grade 1 diastolic dysfunction, normal RV function, moderate MR. Patient being diuresed with IV Lasix . Documented urinary output of 3.65 L yesterday and  net negative 2.4 L this admission. Weight down 4 lbs yesterday. No updated weight today. Renal function stable. - She still has some shortness of breath but overall much improved. Down to 2L of O2 via nasal cannula. No significant edema on exam. - Currently on IV Lasix  80mg  twice daily. She has already received morning dose of IV Lasix  but think we can likely switch to oral Lasix  soon - possible this evening or tomorrow. Will review with MD. - GDMT limited by soft BP.  - Currently on Toprol -XL 12.5mg  daily. Will increase to 25mg  daily for additional heart rate control - If BP tolerate higher dose of Toprol -XL, can consider adding Losartan. I don't think BP will tolerate Entresto or Spironolactone. She is having some urinary retention and has had a lot of urinary procedures in the past so would recommend avoiding SGLT2 inhibitor. - Continue to monitor daily weights, strict I/Os, and renal function.  - Etiology unclear. 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 She does have a strong family history of CAD. Would favor letting her recover from current illness and then we can consider ischemic evaluation as an outpatient.   Elevated Troponin High-sensitivity troponin elevated but flat at 669 >> 649. Repeat EKG on 03/13/2024 showed sinus tachycardia, rate 102 bpm, with slight ST depressions and non-specific T wave changes in lateral leads. Echo shows newly reduced EF of 35-40% but no regional wall motion abnormalities. - No chest pain. - Troponin elevation not felt to be ACS. Felt to be demand ischemia in setting of acute illness. Can consider ischemic evaluation as an outpatient once she recovers from current  illness.  Paroxysmal SVT History of paroxysmal SVT.  - Telemetry shows sinus rhythm with rates in 90s to low 100s but no SVT.  - Will increase Toprol -XL to 25mg  daily.  Moderate Mitral Regurgitation Noted on Echo this admission. - Will need to continue to follow as an outpatient.   Otherwise, per primary team: - Severe sepsis secondary to pneumonia - Acute hypoxic respiratory failure - Chronic anemia - Diarrhea - GERD - Multiple myeloma  For questions or updates, please contact [facility] Please consult [email or web address] for contact info under    Signed, Callie E Goodrich, PA-C  03/14/2024, 8:30 AM    History and all data above reviewed.  I personally took the history today, performed the physical exam and formulated the substantive portion of the above and below assessment and plan.  I reviewed all relevant tests and studies.  She denies acute SOB.  She has tremors and is agitated.  Denies chest pain.   Patient examined.  I agree with the findings as above.  The patient exam reveals COR:RRR  ,  Lungs: Few coarse basilar cracles  ,  Abd: Positive bowel sounds, no rebound no guarding, Ext No edema   .    All available labs, radiology testing, previous records reviewed. Agree with documented assessment and plan.   Acute systolic HF:  Likely stress induced secondary to acute illness/infection.  Med titration is limited by low BP.  She is tolerating and demonstrating improvement with IV diuresis and should continue today.    She is tolerating a low dose of beta blocker and we will titrated GDMT as tolerated.  Avoid SGLT2i.    MR:  As with reduced EF I plan to follow as an out patient with repeat echo and consider right and left cath if MR continues to be as evident and EF reduced.  If these  improve I might consider CT vs Lexiscan Myoview given the increased enzymes.  This likely are demand ischemia rather than ACS.    SVT:  Plan as above with beta blockers.     Lynwood Schilling  9:53 AM  03/14/2024

## 2024-03-14 NOTE — Plan of Care (Signed)
  Problem: Education: Goal: Knowledge of General Education information will improve Description: Including pain rating scale, medication(s)/side effects and non-pharmacologic comfort measures Outcome: Progressing   Problem: Health Behavior/Discharge Planning: Goal: Ability to manage health-related needs will improve Outcome: Progressing   Problem: Clinical Measurements: Goal: Ability to maintain clinical measurements within normal limits will improve Outcome: Progressing Goal: Will remain free from infection Outcome: Progressing Goal: Diagnostic test results will improve Outcome: Progressing Goal: Respiratory complications will improve Outcome: Progressing Goal: Cardiovascular complication will be avoided Outcome: Progressing   Problem: Activity: Goal: Risk for activity intolerance will decrease Outcome: Progressing   Problem: Nutrition: Goal: Adequate nutrition will be maintained Outcome: Progressing   Problem: Elimination: Goal: Will not experience complications related to bowel motility Outcome: Progressing   Problem: Elimination: Goal: Will not experience complications related to urinary retention Outcome: Not Progressing

## 2024-03-14 NOTE — TOC PASRR Note (Signed)
 30 Day PASRR Note   Patient Details  Name: Jean Davidson Date of Birth: August 07, 1945   Transition of Care [facility]) CM/SW Contact:    Jon ONEIDA Anon, RN Phone Number: 03/14/2024, 10:42 AM  To Whom It May Concern:  Please be advised that this patient will require a short-term nursing home stay - anticipated 30 days or less for rehabilitation and strengthening.   The plan is for return home.

## 2024-03-14 NOTE — Progress Notes (Signed)
 Jean Davidson   DOB:15-Sep-1945   FM#:[id number]      ASSESSMENT & PLAN:  Jean Davidson. Petraglia is a 78 year old female patient with oncologic history significant for IgA Lambda Multiple Myeloma.  She was admitted on 9/13 with complaints of dyspnea and diagnosed with severe sepsis and acute hypoxic respiratory failure.  Medical Oncology/Dr. Federico managing.    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 -- Status post allogenic stem sell transplant in 2017 -- Progression of disease 2024, completed CAR-T therapy with [facility] -- Initiated bispecific therapy at [facility] in 2025.  On continued maintenance weekly Elrexfio  at [facility] under Dr. Lafonda management. Received cycle 6 on 02/27/24.  Beginning cycle 7, transition to every 2 weeks.  -- Patient was due for IVIG.  Ordered 400 mcg/kg dosing x1 today, verified dose with Pharmacy, much appreciated. -- Mult myeloma panel to be drawn this weekend, follow results.  -- Medical Oncology/Dr. Federico managing and following closely  Sepsis Pneumonia Acute hypoxic respiratory failure -- Continue management per step-down -- On IV antibiotics -- Continue supportive care  Anemia Thrombocytopenia -- Likely secondary to malignancy, recent oncologic therapy -- Hemoglobin 9.0 and platelets 99k today -- No transfusional intervention required at this time -- Continue to monitor CBC with differential  Heart Failure -- On oxygen , continue -- Patient reports concern that she was told her EF dropped to 35% -- Cardio following    Code Status DNR-INT  Subjective:  Patient seen awake and alert laying in bed.  Reports she is very frustrated, states was told by Hospitalist that she would be discharged today and does not feel ready for discharge.  States she feels a little better than when she came however still very short of breath.  Concerned over urine output.  Concerned about  shaking, asked if I could see her shaking and could not see it visibly.  Denies chest pain or other GI symptoms.  No other acute distress is noted.   Objective:   Intake/Output Summary (Last 24 hours) at 03/14/2024 1115 Last data filed at 03/14/2024 0335 Gross per 24 hour  Intake --  Output 2952 ml  Net -2952 ml     PHYSICAL EXAMINATION: ECOG PERFORMANCE STATUS: 3 - Symptomatic, >50% confined to bed  Vitals:   03/14/24 0800 03/14/24 0902  BP: (!) 134/100   Pulse: 98   Resp: 12   Temp:  98.9 F (37.2 C)  SpO2: 92%    Filed Weights   03/08/24 1106 03/11/24 1325 03/13/24 1626  Weight: 160 lb (72.6 kg) 167 lb 12.3 oz (76.1 kg) 163 lb 5.8 oz (74.1 kg)    GENERAL: alert, no distress and comfortable +chronically ill-appearing SKIN: +pale skin color, texture, turgor are normal, no rashes or significant lesions EYES: normal, conjunctiva are pink and non-injected, sclera clear OROPHARYNX: no exudate, no erythema and lips, buccal mucosa, and tongue normal  NECK: supple, thyroid  normal size, non-tender, without nodularity LYMPH: no palpable lymphadenopathy in the cervical, axillary or inguinal LUNGS: clear to auscultation and percussion with normal breathing effort HEART: regular rate & rhythm and no murmurs and no lower extremity edema ABDOMEN: abdomen soft, non-tender and normal bowel sounds MUSCULOSKELETAL: no cyanosis of digits and no clubbing  PSYCH: alert & oriented x 3 with fluent speech NEURO: no focal motor/sensory deficits   All questions were answered. The patient knows to call the clinic with any problems, questions or concerns.   The total time  spent in the appointment was 40 minutes encounter with patient including review of chart and various tests results, discussions about plan of care and coordination of care plan  Olam JINNY Brunner, NP 03/14/2024 11:15 AM    Labs Reviewed:  Lab Results  Component Value Date   WBC 4.1 03/13/2024   HGB 9.0 (L) 03/13/2024   HCT  29.3 (L) 03/13/2024   MCV 105.8 (H) 03/13/2024   PLT 99 (L) 03/13/2024   Recent Labs    02/27/24 0841 03/05/24 0757 03/08/24 1138 03/08/24 1216 03/12/24 0308 03/13/24 0312 03/14/24 0310  NA 141 141 141   < > 144 143 142  K 3.9 3.5 3.6   < > 4.3 3.7 3.5  CL 106 106 104   < > 106 100 94*  CO2 29 27 22    < > 26 32 33*  GLUCOSE 102* 111* 95   < > 144* 131* 133*  BUN 18 15 11    < > 21 21 24*  CREATININE 0.70 0.71 0.78   < > 0.63 0.74 0.74  CALCIUM  8.7* 8.8* 9.4   < > 8.8* 8.5* 9.0  GFRNONAA >60 >60 >60   < > >60 >60 >60  PROT 6.4* 6.4* 6.4*  --   --   --   --   ALBUMIN 4.0 4.2 4.1  --   --   --   --   AST 17 16 41  --   --   --   --   ALT 13 9 12   --   --   --   --   ALKPHOS 97 96 99  --   --   --   --   BILITOT 0.4 0.4 0.3  --   --   --   --    < > = values in this interval not displayed.    Studies Reviewed:  ECHOCARDIOGRAM COMPLETE Result Date: 03/11/2024    ECHOCARDIOGRAM REPORT   Patient Name:   Jean Davidson Date of Exam: 03/11/2024 Medical Rec #:  [id number]      Height:       62.0 in Accession #:    [id number]     Weight:       160.0 lb Date of Birth:  Nov 03, 1945      BSA:          1.739 m Patient Age:    77 years       BP:           94/54 mmHg Patient Gender: F              HR:           90 bpm. Exam Location:  Inpatient Procedure: 2D Echo, Intracardiac Opacification Agent, Cardiac Doppler and Color            Doppler (Both Spectral and Color Flow Doppler were utilized during            procedure). Indications:    CHF-Acute Diastolic I50.31  History:        Patient has prior history of Echocardiogram examinations, most                 recent 04/20/2021. Risk Factors:Hypertension and Former Smoker.  Sonographer:    Koleen Popper RDCS Referring Phys: [id number] PAULA SOUTHERLY IMPRESSIONS  1. Left ventricular ejection fraction, by estimation, is 35 to 40%. The left ventricle has moderately decreased function. The left ventricle has no regional wall motion abnormalities.  Left  ventricular diastolic parameters are consistent with Grade I diastolic dysfunction (impaired relaxation).  2. Right ventricular systolic function is normal. The right ventricular size is normal. There is mildly elevated pulmonary artery systolic pressure.  3. The mitral valve is normal in structure. Moderate mitral valve regurgitation. No evidence of mitral stenosis.  4. The aortic valve is normal in structure. Aortic valve regurgitation is not visualized. No aortic stenosis is present.  5. The inferior vena cava is normal in size with greater than 50% respiratory variability, suggesting right atrial pressure of 3 mmHg. FINDINGS  Left Ventricle: Left ventricular ejection fraction, by estimation, is 35 to 40%. The left ventricle has moderately decreased function. The left ventricle has no regional wall motion abnormalities. Definity  contrast agent was given IV to delineate the left ventricular endocardial borders. The left ventricular internal cavity size was normal in size. There is no left ventricular hypertrophy. Left ventricular diastolic parameters are consistent with Grade I diastolic dysfunction (impaired relaxation).  LV Wall Scoring: The apex is akinetic. The apical lateral segment, mid inferoseptal segment, apical septal segment, apical anterior segment, and apical inferior segment are hypokinetic. Right Ventricle: The right ventricular size is normal. No increase in right ventricular wall thickness. Right ventricular systolic function is normal. There is mildly elevated pulmonary artery systolic pressure. The tricuspid regurgitant velocity is 3.03  m/s, and with an assumed right atrial pressure of 8 mmHg, the estimated right ventricular systolic pressure is 44.7 mmHg. Left Atrium: Left atrial size was normal in size. Right Atrium: Right atrial size was normal in size. Pericardium: There is no evidence of pericardial effusion. Mitral Valve: The mitral valve is normal in structure. Mild mitral annular  calcification. Moderate mitral valve regurgitation. No evidence of mitral valve stenosis. MV peak gradient, 6.2 mmHg. The mean mitral valve gradient is 3.0 mmHg. Tricuspid Valve: The tricuspid valve is normal in structure. Tricuspid valve regurgitation is mild . No evidence of tricuspid stenosis. Aortic Valve: The aortic valve is normal in structure. Aortic valve regurgitation is not visualized. No aortic stenosis is present. Pulmonic Valve: The pulmonic valve was normal in structure. Pulmonic valve regurgitation is trivial. No evidence of pulmonic stenosis. Aorta: The aortic root is normal in size and structure. Venous: The inferior vena cava is normal in size with greater than 50% respiratory variability, suggesting right atrial pressure of 3 mmHg. IAS/Shunts: No atrial level shunt detected by color flow Doppler.  LEFT VENTRICLE PLAX 2D LVIDd:         4.20 cm   Diastology LVIDs:         3.10 cm   LV e' medial:    6.53 cm/s LV PW:         0.80 cm   LV E/e' medial:  15.2 LV IVS:        1.10 cm   LV e' lateral:   6.74 cm/s LVOT diam:     1.80 cm   LV E/e' lateral: 14.8 LV SV:         45 LV SV Index:   26 LVOT Area:     2.54 cm  RIGHT VENTRICLE             IVC RV S prime:     15.20 cm/s  IVC diam: 2.40 cm TAPSE (M-mode): 2.4 cm LEFT ATRIUM           Index        RIGHT ATRIUM  Index LA diam:      4.00 cm 2.30 cm/m   RA Area:     10.90 cm LA Vol (A2C): 36.0 ml 20.70 ml/m  RA Volume:   24.60 ml  14.15 ml/m LA Vol (A4C): 52.6 ml 30.25 ml/m  AORTIC VALVE LVOT Vmax:   102.00 cm/s LVOT Vmean:  70.600 cm/s LVOT VTI:    0.176 m  AORTA Ao Root diam: 2.80 cm Ao Asc diam:  3.70 cm MITRAL VALVE                  TRICUSPID VALVE MV Area (PHT): 7.16 cm       TR Peak grad:   36.7 mmHg MV Area VTI:   1.89 cm       TR Vmax:        303.00 cm/s MV Peak grad:  6.2 mmHg MV Mean grad:  3.0 mmHg       SHUNTS MV Vmax:       1.25 m/s       Systemic VTI:  0.18 m MV Vmean:      81.2 cm/s      Systemic Diam: 1.80 cm MV Decel Time:  106 msec MR Peak grad:    108.8 mmHg MR Mean grad:    79.0 mmHg MR Vmax:         521.50 cm/s MR Vmean:        427.0 cm/s MR PISA:         1.01 cm MR PISA Eff ROA: 7 mm MR PISA Radius:  0.40 cm MV E velocity: 99.50 cm/s MV A velocity: 78.80 cm/s MV E/A ratio:  1.26 Aditya Sabharwal Electronically signed by Ria Commander Signature Date/Time: 03/11/2024/5:59:16 PM    Final    CT Angio Chest Pulmonary Embolism (PE) W or WO Contrast Result Date: 03/09/2024 EXAM: CTA of the Chest with contrast for PE 03/09/2024 12:17:09 AM TECHNIQUE: CTA of the chest was performed after the administration of intravenous contrast. Multiplanar reformatted images are provided for review. MIP images are provided for review. Automated exposure control, iterative reconstruction, and/or weight based adjustment of the mA/kV was utilized to reduce the radiation dose to as low as reasonably achievable. COMPARISON: None available. CLINICAL HISTORY: Pulmonary embolism (PE) suspected, high prob. FINDINGS: PULMONARY ARTERIES: Pulmonary arteries are adequately opacified for evaluation. No pulmonary embolism. Main pulmonary artery is normal in caliber. MEDIASTINUM: The heart and pericardium demonstrate no acute abnormality. No pericardial effusion. Mild atherosclerotic calcification within the thoracic aorta. No aortic aneurysm. LYMPH NODES: No mediastinal, hilar or axillary lymphadenopathy. LUNGS AND PLEURA: Extensive bilateral airspace infiltrate with bilateral lower lobe dependent consolidation in keeping with multifocal infection or aspiration. Small bilateral pleural effusions. No pneumothorax. No central obstructing lesion. UPPER ABDOMEN: Moderate hiatal hernia. Cholelithiasis without superimposed pericholecystic inflammatory change. No intra or extrahepatic biliary ductal dilation. SOFT TISSUES AND BONES: Sclerotic margined lytic lesion within the T3 vertebral body, possibly an intraosseous hemangioma, with superimposed pathologic  compression deformity noted. This is likely chronic in nature. Chronic mild L5 compression deformity noted. No retropulsion. Osseous structures are otherwise age appropriate. Right reverse shoulder arthroplasty noted. No acute bone abnormality. IMPRESSION: 1. No pulmonary embolism. 2. Extensive bilateral airspace infiltrate with bilateral lower lobe dependent consolidation, consistent with multifocal infection or aspiration. 3. Small bilateral pleural effusions. 4. Moderate hiatal hernia. Electronically signed by: Dorethia Molt MD 03/09/2024 12:24 AM EDT RP Workstation: [id number]   DG Chest Port 1 View Result Date: 03/08/2024 EXAM: 1 VIEW XRAY OF  THE CHEST 03/08/2024 11:37:42 AM COMPARISON: 03/05/2024 CLINICAL HISTORY: Questionable sepsis - evaluate for abnormality. Per chart: tachycardia. She is currently receiving chemo. Was seen last week for fevers at outpatient office. 89% on RA during triage. FINDINGS: LUNGS AND PLEURA: Calcified right lower lobe granuloma. No focal pulmonary opacity. No pulmonary edema. No pleural effusion. No pneumothorax. HEART AND MEDIASTINUM: Aortic calcification. No acute abnormality of the cardiac and mediastinal silhouettes. BONES AND SOFT TISSUES: Right shoulder arthroplasty noted. Old healed right rib fractures. Degenerative changes of spine. No acute osseous abnormality. IMPRESSION: 1. No acute findings. Electronically signed by: Waddell Calk MD 03/08/2024 12:10 PM EDT RP Workstation: [id number]   DG Chest 2 View Result Date: 03/05/2024 CLINICAL DATA:  Fever, shortness of breath EXAM: CHEST - 2 VIEW COMPARISON:  April 05, 2021 chest radiograph FINDINGS: Cyst unchanged chronic calcified pulmonary granuloma in the right lower lung. No focal consolidations. No pleural effusions. No pneumothorax. Unchanged cardiomediastinal silhouette. No acute osseous findings. Partially imaged right shoulder arthroplasty hardware. IMPRESSION: No acute findings. Electronically Signed   By:  Michaeline Blanch M.D.   On: 03/05/2024 12:07

## 2024-03-15 DIAGNOSIS — I5021 Acute systolic (congestive) heart failure: Secondary | ICD-10-CM | POA: Diagnosis not present

## 2024-03-15 DIAGNOSIS — I4719 Other supraventricular tachycardia: Secondary | ICD-10-CM | POA: Diagnosis not present

## 2024-03-15 LAB — BASIC METABOLIC PANEL WITH GFR
Anion gap: 14 (ref 5–15)
BUN: 28 mg/dL — ABNORMAL HIGH (ref 8–23)
CO2: 35 mmol/L — ABNORMAL HIGH (ref 22–32)
Calcium: 9.2 mg/dL (ref 8.9–10.3)
Chloride: 91 mmol/L — ABNORMAL LOW (ref 98–111)
Creatinine, Ser: 0.79 mg/dL (ref 0.44–1.00)
GFR, Estimated: >60 mL/min (ref >60)
Glucose, Bld: 121 mg/dL — ABNORMAL HIGH (ref 70–99)
Potassium: 3.3 mmol/L — ABNORMAL LOW (ref 3.5–5.1)
Sodium: 140 mmol/L (ref 135–145)

## 2024-03-15 LAB — CBC
HCT: 32.6 % — ABNORMAL LOW (ref 36.0–46.0)
Hemoglobin: 10.2 g/dL — ABNORMAL LOW (ref 12.0–15.0)
MCH: 32.5 pg (ref 26.0–34.0)
MCHC: 31.3 g/dL (ref 30.0–36.0)
MCV: 103.8 fL — ABNORMAL HIGH (ref 80.0–100.0)
Platelets: 101 K/uL — ABNORMAL LOW (ref 150–400)
RBC: 3.14 MIL/uL — ABNORMAL LOW (ref 3.87–5.11)
RDW: 14.6 % (ref 11.5–15.5)
WBC: 3.7 K/uL — ABNORMAL LOW (ref 4.0–10.5)
nRBC: 0 % (ref 0.0–0.2)

## 2024-03-15 LAB — GLUCOSE, CAPILLARY
Glucose-Capillary: 162 mg/dL — ABNORMAL HIGH (ref 70–99)
Glucose-Capillary: 139 mg/dL — ABNORMAL HIGH (ref 70–99)
Glucose-Capillary: 118 mg/dL — ABNORMAL HIGH (ref 70–99)
Glucose-Capillary: 128 mg/dL — ABNORMAL HIGH (ref 70–99)

## 2024-03-15 LAB — MAGNESIUM: Magnesium: 2.2 mg/dL (ref 1.7–2.4)

## 2024-03-15 MED ORDER — FUROSEMIDE 40 MG PO TABS
80.0000 mg | ORAL_TABLET | Freq: Every day | ORAL | Status: DC
  Administered 2024-03-15 – 2024-03-20 (×6): 80 mg via ORAL
  Filled 2024-03-15 (×6): qty 2

## 2024-03-15 MED ORDER — POTASSIUM CHLORIDE CRYS ER 20 MEQ PO TBCR
40.0000 meq | EXTENDED_RELEASE_TABLET | Freq: Once | ORAL | Status: AC
  Administered 2024-03-15: 40 meq via ORAL
  Filled 2024-03-15: qty 2

## 2024-03-15 NOTE — Progress Notes (Signed)
 Rounding Note   Patient Name: Jean Davidson Date of Encounter: 03/15/2024  [facility] Cardiologist: Deatrice Cage, MD   Subjective Net negative another 1.1L overnight- overall 3.6L negative. Potassium 3.3 today - creatinine stable.   Scheduled Meds:  acyclovir   400 mg Oral BID   aspirin  EC  81 mg Oral Daily   atovaquone   1,500 mg Oral BID   Chlorhexidine  Gluconate Cloth  6 each Topical Daily   cholecalciferol   4,000 Units Oral Daily   furosemide   80 mg Intravenous BID   insulin  aspart  0-6 Units Subcutaneous TID AC & HS   metoprolol  succinate  25 mg Oral Daily   mirtazapine   15 mg Oral QHS   multivitamin  2 tablet Oral BID   pantoprazole   40 mg Oral Daily   potassium chloride   40 mEq Oral Once   saccharomyces boulardii  250 mg Oral BID   Continuous Infusions:  Immune Globulin  10%     PRN Meds: acetaminophen  **OR** acetaminophen , antiseptic oral rinse, calcium  carbonate, diphenoxylate -atropine , guaiFENesin -dextromethorphan , hydrALAZINE , labetalol , levalbuterol , loperamide , melatonin, metoprolol  tartrate, morphine  injection, Muscle Rub, ondansetron  **OR** ondansetron  (ZOFRAN ) IV, mouth rinse, polyethylene glycol, sodium chloride  flush   Vital Signs  Vitals:   03/15/24 0500 03/15/24 0600 03/15/24 0640 03/15/24 0800  BP: (!) 100/57 114/63  126/72  Pulse: 95 97  98  Resp: 15 17  13   Temp:      TempSrc:      SpO2: 95% 96%  92%  Weight:   69.7 kg   Height:        Intake/Output Summary (Last 24 hours) at 03/15/2024 0828 Last data filed at 03/15/2024 0641 Gross per 24 hour  Intake --  Output 1175 ml  Net -1175 ml      03/15/2024    6:40 AM 03/13/2024    4:26 PM 03/11/2024    1:25 PM  Last 3 Weights  Weight (lbs) 153 lb 10.6 oz 163 lb 5.8 oz 167 lb 12.3 oz  Weight (kg) 69.7 kg 74.1 kg 76.1 kg      Telemetry Sinus tachycardia- Personally Reviewed  ECG  N/A  Physical Exam  General appearance: alert and no distress Neck: no carotid bruit, no JVD,  and thyroid  not enlarged, symmetric, no tenderness/mass/nodules Lungs: diminished breath sounds bibasilar Heart: regular rate and rhythm Extremities: edema trace to 1+ bilateral Neurologic: Grossly normal  Labs High Sensitivity Troponin:  No results for input(s): TROPONINIHS in the last 720 hours.   Chemistry Recent Labs  Lab 03/08/24 1138 03/08/24 1216 03/12/24 0308 03/13/24 0312 03/14/24 0310 03/15/24 0308  NA 141   < > 144 143 142 140  K 3.6   < > 4.3 3.7 3.5 3.3*  CL 104   < > 106 100 94* 91*  CO2 22   < > 26 32 33* 35*  GLUCOSE 95   < > 144* 131* 133* 121*  BUN 11   < > 21 21 24* 28*  CREATININE 0.78   < > 0.63 0.74 0.74 0.79  CALCIUM  9.4   < > 8.8* 8.5* 9.0 9.2  MG  --    < > 2.1 1.8  --  2.2  PROT 6.4*  --   --   --   --   --   ALBUMIN 4.1  --   --   --   --   --   AST 41  --   --   --   --   --  ALT 12  --   --   --   --   --   ALKPHOS 99  --   --   --   --   --   BILITOT 0.3  --   --   --   --   --   GFRNONAA >60   < > >60 >60 >60 >60  ANIONGAP 15   < > 12 12 15 14    < > = values in this interval not displayed.    Lipids No results for input(s): CHOL, TRIG, HDL, LABVLDL, LDLCALC, CHOLHDL in the last 168 hours.  Hematology Recent Labs  Lab 03/12/24 0308 03/13/24 0312 03/15/24 0308  WBC 5.1 4.1 3.7*  RBC 2.80* 2.77* 3.14*  HGB 8.9* 9.0* 10.2*  HCT 29.7* 29.3* 32.6*  MCV 106.1* 105.8* 103.8*  MCH 31.8 32.5 32.5  MCHC 30.0 30.7 31.3  RDW 14.5 14.5 14.6  PLT 102* 99* 101*   Thyroid  No results for input(s): TSH, FREET4 in the last 168 hours.  BNP Recent Labs  Lab 03/10/24 0258  PROBNP 16,972.0*    DDimer  Recent Labs  Lab 03/08/24 1855  DDIMER 1.99*     Radiology  No results found.  Cardiac Studies Echocardiogram 03/11/2024: Impressions: 1. Left ventricular ejection fraction, by estimation, is 35 to 40%. The  left ventricle has moderately decreased function. The left ventricle has  no regional wall motion abnormalities.  Left ventricular diastolic  parameters are consistent with Grade I  diastolic dysfunction (impaired relaxation).   2. Right ventricular systolic function is normal. The right ventricular  size is normal. There is mildly elevated pulmonary artery systolic  pressure.   3. The mitral valve is normal in structure. Moderate mitral valve  regurgitation. No evidence of mitral stenosis.   4. The aortic valve is normal in structure. Aortic valve regurgitation is  not visualized. No aortic stenosis is present.   5. The inferior vena cava is normal in size with greater than 50%  respiratory variability, suggesting right atrial pressure of 3 mmHg.    Patient Profile   78 y.o. female with a history of paroxysmal SVT, remote large pericardial effusion (resolved without intervention), mild carotid stenosis, hypertension, GERD, chronic anemia, relapsed/ refractory multiple myeloma on Elrexfio  who was admitted on 03/08/2024 for severe sepsis and acute hypoxic respiratory failure secondary to pneumonia. However, work-up also revealed markedly elevated pro-BNP and reduced EF. Cardiology consulted on 03/12/2024 for evaluation of acute CHF.  Assessment & Plan   Acute HFrEF Pro BNP markedly elevated at 16,972. Chest x-ray showed now acute findings. Chest CTA negative for PE but showed extensive bilateral airspace infiltrates consistent with multifocal infection or aspiration as well as small bilateral pleural effusions. Echo showed LVEF of 35-40% with no regional wall motion abnormalities and grade 1 diastolic dysfunction, normal RV function, moderate MR. Patient being diuresed with IV Lasix . Documented urinary output of 3.65 L yesterday and net negative 2.4 L this admission. Weight down 4 lbs yesterday. No updated weight today. Renal function stable. - Will switch to oral Lasix  80 mg p.o. daily today.  Monitor renal function. - Potassium 3.3-replete - GDMT limited by soft BP.  - Continue Toprol -XL 25 mg daily -  Continue to monitor daily weights, strict I/Os, and renal function.  - Etiology unclear. 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 She does have a strong family history of CAD. Would favor letting her recover from current illness and then we can consider ischemic evaluation  as an outpatient.   Elevated Troponin High-sensitivity troponin elevated but flat at 669 >> 649. Repeat EKG on 03/13/2024 showed sinus tachycardia, rate 102 bpm, with slight ST depressions and non-specific T wave changes in lateral leads. Echo shows newly reduced EF of 35-40% but no regional wall motion abnormalities. - No chest pain. - Troponin elevation not felt to be ACS. Felt to be demand ischemia in setting of acute illness. Can consider ischemic evaluation as an outpatient once she recovers from current illness.  Paroxysmal SVT History of paroxysmal SVT.  - Telemetry shows sinus rhythm with rates in 90s to low 100s but no SVT.  - On Toprol -XL to 25mg  daily, now with sinus rhythm to sinus tachycardia  Moderate Mitral Regurgitation Noted on Echo this admission. - Will need to continue to follow as an outpatient.   Otherwise, per primary team: - Severe sepsis secondary to pneumonia - Acute hypoxic respiratory failure - Chronic anemia - Diarrhea - GERD - Multiple myeloma  For questions or updates, please contact [facility] Please consult [email or web address] for contact info under   Vinie KYM Maxcy, MD, [facility], FNLA, FACP  [place]  [facility]  Medical Director of the [facility] Lipid [facility]  Cardiovascular Risk Reduction Clinic Diplomate of the American Board of Clinical Lipidology Attending Cardiologist  Direct Dial: [phone]  Fax: [phone]  Website:  [email or web address]  Vinie JAYSON Maxcy, MD  03/15/2024, 8:28 AM

## 2024-03-15 NOTE — Progress Notes (Signed)
 PROGRESS NOTE  ANGLES TREVIZO FMW:[id number] DOB: 1945/08/03 DOA: 03/08/2024 PCP: Patient, [name]   LOS: 7 days   Brief narrative:  Ms. Guarino is a 78 year old female with past medical history of relapsed/Refractory IgA Lambda Multiple Myeloma, GERD, HTN, macular degeneration, cervical spinal stenosis presented to hospital with shortness of breath.  Patient was recently seen as outpatient for low-grade fever and was presumed to have UTI and received ciprofloxacin .  On this presentation she presented with progressive shortness of breath nonproductive cough and low-grade fever of 101.  In the ED, patient was slightly tachycardic and needed supplemental oxygen .  Chest x-ray did not show any infiltrate.  Subsequently CT angiogram of the chest was done which showed negative for PE but bilateral airspace disease consistent with multifocal pneumonia..  Initial labs were notable for WBC at 4.2 with 2% bands,on elranatamab .  COVID, flu, RSV negative.  Procalcitonin was negative.  Patient had elevated lactate on presentation with trended up to 5.3.  Received 3-1/2 L of IV fluids in the ED.  Continue vancomycin  cefepime  and Flagyl .  Given acute respiratory failure, patient also required BiPAP due to increased work of breathing and was admitted to the hospital for further evaluation and treatment.     Assessment/Plan: Principal Problem:   Severe sepsis (HCC) Active Problems:   Acute hypoxic respiratory failure (HCC)   CAP (community acquired pneumonia)   Multiple myeloma in remission (HCC)   Essential hypertension   Anemia due to antineoplastic chemotherapy   Diarrhea   GERD (gastroesophageal reflux disease)   Peripheral neuropathy due to chemotherapy (HCC)  Severe sepsis likely secondary to multifocal community-acquired pneumonia. Patient received vancomycin , cefepime  and Flagyl  during hospitalization and currently has completed the course.  PCCM and  ID were on board. Patient had not been taking   atovaquone  at home due to taste (takes every couple days).  Currently on  nasal cannula at 2 L/min.   Acute hypoxic respiratory failure secondary to severe multifocal pneumonia and heart failure.  PE was ruled out.  Was initially on high flow nasal cannula and BiPA, Solu-Medrol .  PCCM followed the patient during hospitalization.  Patient has completed course of antibiotic and steroids at this time.   Currently on at 2 L/min.  2D echocardiogram was performed on 03/11/2024 with LV ejection fraction of 35 to 40% with no regional wall motion abnormality and grade 1 diastolic dysfunction.  Cardiology on board.  Acute systolic heart failure   New findings of LV ejection fraction of 35 to 40%.  Was intially on 80 mg of IV Lasix  twice daily with good diuresis.  Has been changed to lasix  80 mg orally daily. Patient is negative balance for 3606 mL so far and had total urinary output of 1175 mL in 24 hours.  Will continue to wean oxygen  as able.   On metoprolol  25 mg daily. Cardiology on board.    Relapsed multiple myeloma with acquired hypogammaglobinemia Follows up with Dr. Federico oncology as outpatient. - 2016: Underwent autologous stem cell transplant. 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 10/16/2022: Completed CAR-T therapy with [employer]ecma. 09/2023: Started on Elrexfio  due to progression of disease. 01/02/2024: Last visit at [facility] where induction therapy was completed - Now on Elranatamab  (Elrexfio ); just had cycle 6 on 02/27/24 - Continue atovaquone  and acyclovir  Previous provider had spoken with Dr. Federico on 03/11/2024; patient due for IVIG on 03/12/2024.  Oncology on board.  Plan for IVIG today.  Mild hypokalemia.  Potassium was 3.3 today.  Will replace orally.   Anemia due to antineoplastic chemotherapy Hemoglobin today at 10.2 from 9.0.  Continue to monitor   Essential hypertension Continue IV diuretics and metoprolol .  Latest blood pressure 126/72   Diarrhea. History of chronic  diarrhea.  Was recently on ciprofloxacin .  Continue as needed Imodium  Lomotil .    GERD (gastroesophageal reflux disease) Continue Protonix   Debility weakness.  Seen by physical therapy and recommend home health on discharge but patient wishes short-term placement..  Acute urinary retention.  Required In-N-Out catheter x 2 so was put on a Foley catheter.  Will try voiding trial in couple of days when patient is more mobile.  DVT prophylaxis: SCDs Start: 03/08/24 1347   Disposition: Home with home health as per PT evaluation but patient versus placement.  Status is: Inpatient Remains inpatient appropriate because: Pending clinical improvement, supplemental oxygen , need for rehabitation.  Code Status:     Code Status: Do not attempt resuscitation (DNR) PRE-ARREST INTERVENTIONS DESIRED  Family Communication:   I tried to reach out to the patient's daughter on the phone multiple times but was unable to reach out t on 03/14/2024  Consultants: PCCM Cardiology Oncology  Procedures: BiPAP  Anti-infectives:  Acyclovir , atovaquone   Anti-infectives (From admission, onward)    Start     Dose/Rate Route Frequency Ordered Stop   03/10/24 1100  azithromycin  (ZITHROMAX ) 500 mg in sodium chloride  0.9 % 250 mL IVPB        500 mg 250 mL/hr over 60 Minutes Intravenous Every 24 hours 03/10/24 0928 03/12/24 1120   03/09/24 1200  vancomycin  (VANCOCIN ) IVPB 1000 mg/200 mL premix  Status:  Discontinued        1,000 mg 200 mL/hr over 60 Minutes Intravenous Every 24 hours 03/08/24 1554 03/10/24 2102   03/09/24 0000  ceFEPIme  (MAXIPIME ) 2 g in sodium chloride  0.9 % 100 mL IVPB        2 g 200 mL/hr over 30 Minutes Intravenous Every 12 hours 03/08/24 1554 03/12/24 1233   03/08/24 2200  metroNIDAZOLE  (FLAGYL ) IVPB 500 mg        500 mg 100 mL/hr over 60 Minutes Intravenous Every 12 hours 03/08/24 1523 03/12/24 2219   03/08/24 2200  acyclovir  (ZOVIRAX ) tablet 400 mg        400 mg Oral 2 times daily  03/08/24 1643     03/08/24 2200  atovaquone  (MEPRON ) 750 MG/5ML suspension 1,500 mg        1,500 mg Oral 2 times daily 03/08/24 1846     03/08/24 1115  ceFEPIme  (MAXIPIME ) 2 g in sodium chloride  0.9 % 100 mL IVPB        2 g 200 mL/hr over 30 Minutes Intravenous  Once 03/08/24 1112 03/08/24 1408   03/08/24 1115  metroNIDAZOLE  (FLAGYL ) IVPB 500 mg        500 mg 100 mL/hr over 60 Minutes Intravenous  Once 03/08/24 1112 03/08/24 1302   03/08/24 1115  vancomycin  (VANCOCIN ) IVPB 1000 mg/200 mL premix        1,000 mg 200 mL/hr over 60 Minutes Intravenous  Once 03/08/24 1112 03/08/24 1533       Subjective: Today, patient was seen and examined at bedside.  Has had good diuresis and weight loss.  Has been eating some.  No nausea vomiting fever chills or rigor.  Trying to do some physical activity.  Objective: Vitals:   03/15/24 0600 03/15/24 0800  BP: 114/63 126/72  Pulse: 97 98  Resp: 17 13  Temp:  SpO2: 96% 92%    Intake/Output Summary (Last 24 hours) at 03/15/2024 0910 Last data filed at 03/15/2024 0641 Gross per 24 hour  Intake --  Output 1175 ml  Net -1175 ml   Filed Weights   03/11/24 1325 03/13/24 1626 03/15/24 0640  Weight: 76.1 kg 74.1 kg 69.7 kg   Body mass index is 28.1 kg/m.   Physical Exam: General:  Average built, not in obvious distress, on 2 L of oxygen  by nasal cannula, elderly female HENT:   Mild pallor noted.  Oral mucosa is moist.  Chest: Decreased breath sounds bilaterally. CVS: S1 &S2 heard. No murmur.  Regular rate and rhythm. Abdomen: Soft, nontender, nondistended.  Bowel sounds are heard.   Extremities: No cyanosis, clubbing with trace bilateral lower extremity edema.  Peripheral pulses are palpable. Psych: Alert, awake and oriented, normal mood CNS:  No cranial nerve deficits.  Moves all extremities. Skin: Warm and dry.  No rashes noted.  Data Review: I have personally reviewed the following laboratory data and studies,  CBC: Recent Labs  Lab  03/21/24 0523 03/10/24 0258 03/11/24 0430 03/12/24 0308 03/13/24 0312 03/15/24 0308  WBC 5.4 6.2 7.2 5.1 4.1 3.7*  NEUTROABS 3.2 4.9 5.1 4.3 2.7  --   HGB 9.6* 8.7* 8.7* 8.9* 9.0* 10.2*  HCT 31.9* 27.5* 28.2* 29.7* 29.3* 32.6*  MCV 105.3* 105.0* 104.4* 106.1* 105.8* 103.8*  PLT 105* 97* 103* 102* 99* 101*   Basic Metabolic Panel: Recent Labs  Lab 03/11/24 0430 03/11/24 1333 03/12/24 0308 03/13/24 0312 03/14/24 0310 03/15/24 0308  NA 143 141 144 143 142 140  K 3.2* 3.2* 4.3 3.7 3.5 3.3*  CL 105 104 106 100 94* 91*  CO2 27 24 26  32 33* 35*  GLUCOSE 153* 186* 144* 131* 133* 121*  BUN 20 22 21 21  24* 28*  CREATININE 0.69 0.71 0.63 0.74 0.74 0.79  CALCIUM  8.7* 8.9 8.8* 8.5* 9.0 9.2  MG 2.1 2.0 2.1 1.8  --  2.2   Liver Function Tests: Recent Labs  Lab 03/08/24 1138  AST 41  ALT 12  ALKPHOS 99  BILITOT 0.3  PROT 6.4*  ALBUMIN 4.1   No results for input(s): LIPASE, AMYLASE in the last 168 hours. No results for input(s): AMMONIA in the last 168 hours. Cardiac Enzymes: No results for input(s): CKTOTAL, CKMB, CKMBINDEX, TROPONINI in the last 168 hours. BNP (last 3 results) No results for input(s): BNP in the last 8760 hours.  ProBNP (last 3 results) Recent Labs    03/10/24 0258  PROBNP 16,972.0*    CBG: Recent Labs  Lab 03/14/24 0819 03/14/24 1148 03/14/24 1615 03/14/24 2110 03/15/24 0833  GLUCAP 134* 200* 120* 168* 162*   Recent Results (from the past 240 hours)  Culture, blood (Routine X 2) w Reflex to ID Panel     Status: None   Collection Time: 03/05/24  9:41 AM   Specimen: BLOOD RIGHT HAND  Result Value Ref Range Status   Specimen Description   Final    BLOOD RIGHT HAND Performed at [facility], [address], [place], [place] [address]    Special Requests   Final    NONE Performed at [facility], [address], [place], [place] [address]    Culture   Final    NO GROWTH 5  DAYS Performed at [facility], [address], [place], [place] [address]    Report Status 03/10/2024 FINAL  Final  Resp  panel by RT-PCR (RSV, Flu A&B, Covid) Anterior Nasal Swab     Status: None   Collection Time: 03/05/24 10:10 AM   Specimen: Anterior Nasal Swab  Result Value Ref Range Status   SARS Coronavirus 2 by RT PCR NEGATIVE NEGATIVE Final    Comment: (NOTE) SARS-CoV-2 target nucleic acids are NOT DETECTED.  The SARS-CoV-2 RNA is generally detectable in upper respiratory specimens during the acute phase of infection. The lowest concentration of SARS-CoV-2 viral copies this assay can detect is 138 copies/mL. A negative result does not preclude SARS-Cov-2 infection and should not be used as the sole basis for treatment or other patient management decisions. A negative result may occur with  improper specimen collection/handling, submission of specimen other than nasopharyngeal swab, presence of viral mutation(s) within the areas targeted by this assay, and inadequate number of viral copies(<138 copies/mL). A negative result must be combined with clinical observations, patient history, and epidemiological information. The expected result is Negative.  Fact Sheet for Patients:  BloggerCourse.com  Fact Sheet for Healthcare Providers:  SeriousBroker.it  This test is no t yet approved or cleared by the [place]  FDA and  has been authorized for detection and/or diagnosis of SARS-CoV-2 by FDA under an Emergency Use Authorization (EUA). This EUA will remain  in effect (meaning this test can be used) for the duration of the COVID-19 declaration under Section 564(b)(1) of the Act, 21 U.S.C.section 360bbb-3(b)(1), unless the authorization is terminated  or revoked sooner.       Influenza A by PCR NEGATIVE NEGATIVE Final   Influenza B by PCR NEGATIVE NEGATIVE Final    Comment: (NOTE) The Xpert Xpress  SARS-CoV-2/FLU/RSV plus assay is intended as an aid in the diagnosis of influenza from Nasopharyngeal swab specimens and should not be used as a sole basis for treatment. Nasal washings and aspirates are unacceptable for Xpert Xpress SARS-CoV-2/FLU/RSV testing.  Fact Sheet for Patients: BloggerCourse.com  Fact Sheet for Healthcare Providers: SeriousBroker.it  This test is not yet approved or cleared by the [place]  FDA and has been authorized for detection and/or diagnosis of SARS-CoV-2 by FDA under an Emergency Use Authorization (EUA). This EUA will remain in effect (meaning this test can be used) for the duration of the COVID-19 declaration under Section 564(b)(1) of the Act, 21 U.S.C. section 360bbb-3(b)(1), unless the authorization is terminated or revoked.     Resp Syncytial Virus by PCR NEGATIVE NEGATIVE Final    Comment: (NOTE) Fact Sheet for Patients: BloggerCourse.com  Fact Sheet for Healthcare Providers: SeriousBroker.it  This test is not yet approved or cleared by the [place]  FDA and has been authorized for detection and/or diagnosis of SARS-CoV-2 by FDA under an Emergency Use Authorization (EUA). This EUA will remain in effect (meaning this test can be used) for the duration of the COVID-19 declaration under Section 564(b)(1) of the Act, 21 U.S.C. section 360bbb-3(b)(1), unless the authorization is terminated or revoked.  Performed at [facility], [address], [place], [place] [address]   Culture, blood (Routine X 2) w Reflex to ID Panel     Status: None   Collection Time: 03/05/24 10:21 AM   Specimen: BLOOD LEFT HAND  Result Value Ref Range Status   Specimen Description   Final    BLOOD LEFT HAND Performed at [facility], [address] [place], [place] [address]    Special Requests   Final     NONE Performed at [facility],  [address] [place], [place] [address]    Culture   Final    NO GROWTH 5 DAYS Performed at [facility], [address], [place], [place] [address]    Report Status 03/10/2024 FINAL  Final  Urine Culture     Status: Abnormal   Collection Time: 03/05/24  3:12 PM   Specimen: Urine, Clean Catch  Result Value Ref Range Status   Specimen Description   Final    URINE, CLEAN CATCH Performed at [facility], [address], [place], [place] [address]    Special Requests   Final    NONE Performed at [facility], [address] [place], [place] [address]    Culture MULTIPLE SPECIES PRESENT, SUGGEST RECOLLECTION (A)  Final   Report Status 03/06/2024 FINAL  Final  Resp panel by RT-PCR (RSV, Flu A&B, Covid) Anterior Nasal Swab     Status: None   Collection Time: 03/08/24 11:38 AM   Specimen: Anterior Nasal Swab  Result Value Ref Range Status   SARS Coronavirus 2 by RT PCR NEGATIVE NEGATIVE Final    Comment: (NOTE) SARS-CoV-2 target nucleic acids are NOT DETECTED.  The SARS-CoV-2 RNA is generally detectable in upper respiratory specimens during the acute phase of infection. The lowest concentration of SARS-CoV-2 viral copies this assay can detect is 138 copies/mL. A negative result does not preclude SARS-Cov-2 infection and should not be used as the sole basis for treatment or other patient management decisions. A negative result may occur with  improper specimen collection/handling, submission of specimen other than nasopharyngeal swab, presence of viral mutation(s) within the areas targeted by this assay, and inadequate number of viral copies(<138 copies/mL). A negative result must be combined with clinical observations, patient history, and epidemiological information. The expected result is Negative.  Fact Sheet for Patients:   BloggerCourse.com  Fact Sheet for Healthcare Providers:  SeriousBroker.it  This test is no t yet approved or cleared by the [place]  FDA and  has been authorized for detection and/or diagnosis of SARS-CoV-2 by FDA under an Emergency Use Authorization (EUA). This EUA will remain  in effect (meaning this test can be used) for the duration of the COVID-19 declaration under Section 564(b)(1) of the Act, 21 U.S.C.section 360bbb-3(b)(1), unless the authorization is terminated  or revoked sooner.       Influenza A by PCR NEGATIVE NEGATIVE Final   Influenza B by PCR NEGATIVE NEGATIVE Final    Comment: (NOTE) The Xpert Xpress SARS-CoV-2/FLU/RSV plus assay is intended as an aid in the diagnosis of influenza from Nasopharyngeal swab specimens and should not be used as a sole basis for treatment. Nasal washings and aspirates are unacceptable for Xpert Xpress SARS-CoV-2/FLU/RSV testing.  Fact Sheet for Patients: BloggerCourse.com  Fact Sheet for Healthcare Providers: SeriousBroker.it  This test is not yet approved or cleared by the [place]  FDA and has been authorized for detection and/or diagnosis of SARS-CoV-2 by FDA under an Emergency Use Authorization (EUA). This EUA will remain in effect (meaning this test can be used) for the duration of the COVID-19 declaration under Section 564(b)(1) of the Act, 21 U.S.C. section 360bbb-3(b)(1), unless the authorization is terminated or revoked.     Resp Syncytial Virus by PCR NEGATIVE NEGATIVE Final    Comment: (NOTE) Fact Sheet for Patients: BloggerCourse.com  Fact Sheet for Healthcare Providers: SeriousBroker.it  This test is not yet approved or cleared by the [place]  FDA and has been authorized for  detection and/or diagnosis of SARS-CoV-2 by FDA under an Emergency Use  Authorization (EUA). This EUA will remain in effect (meaning this test can be used) for the duration of the COVID-19 declaration under Section 564(b)(1) of the Act, 21 U.S.C. section 360bbb-3(b)(1), unless the authorization is terminated or revoked.  Performed at [facility], [address], [place], [place] [address]   Respiratory (~20 pathogens) panel by PCR     Status: None   Collection Time: 03/08/24 11:38 AM   Specimen: Nasopharyngeal Swab; Respiratory  Result Value Ref Range Status   Adenovirus NOT DETECTED NOT DETECTED Final   Coronavirus 229E NOT DETECTED NOT DETECTED Final    Comment: (NOTE) The Coronavirus on the Respiratory Panel, DOES NOT test for the novel  Coronavirus (2019 nCoV)    Coronavirus HKU1 NOT DETECTED NOT DETECTED Final   Coronavirus NL63 NOT DETECTED NOT DETECTED Final   Coronavirus OC43 NOT DETECTED NOT DETECTED Final   Metapneumovirus NOT DETECTED NOT DETECTED Final   Rhinovirus / Enterovirus NOT DETECTED NOT DETECTED Final   Influenza A NOT DETECTED NOT DETECTED Final   Influenza B NOT DETECTED NOT DETECTED Final   Parainfluenza Virus 1 NOT DETECTED NOT DETECTED Final   Parainfluenza Virus 2 NOT DETECTED NOT DETECTED Final   Parainfluenza Virus 3 NOT DETECTED NOT DETECTED Final   Parainfluenza Virus 4 NOT DETECTED NOT DETECTED Final   Respiratory Syncytial Virus NOT DETECTED NOT DETECTED Final   Bordetella pertussis NOT DETECTED NOT DETECTED Final   Bordetella Parapertussis NOT DETECTED NOT DETECTED Final   Chlamydophila pneumoniae NOT DETECTED NOT DETECTED Final   Mycoplasma pneumoniae NOT DETECTED NOT DETECTED Final    Comment: Performed at [facility], [address] [place], [place] [address]  Blood Culture (routine x 2)     Status: None   Collection Time: 03/08/24  1:34 PM   Specimen: BLOOD LEFT ARM  Result Value Ref Range Status   Specimen Description   Final    BLOOD LEFT ARM Performed at [facility], [address], [place], [place] [address]    Special Requests   Final    BOTTLES DRAWN AEROBIC ONLY Blood Culture results may not be optimal due to an inadequate volume of blood received in culture bottles Performed at [facility], [address], [place], [place] [address]    Culture   Final    NO GROWTH 5 DAYS Performed at [facility], [address], [place], [place] [address]    Report Status 03/13/2024 FINAL  Final  Blood Culture (routine x 2)     Status: None   Collection Time: 03/08/24  1:58 PM   Specimen: BLOOD LEFT ARM  Result Value Ref Range Status   Specimen Description   Final    BLOOD LEFT ARM Performed at [facility], [address], [place], [place] [address]    Special Requests   Final    BOTTLES DRAWN AEROBIC AND ANAEROBIC Blood Culture adequate volume Performed at [facility], [address], [place], [place] [address]    Culture   Final    NO GROWTH 5 DAYS Performed at [facility], [address], [place], [place] [address]    Report Status 03/13/2024 FINAL  Final  MRSA Next Gen by PCR, Nasal     Status: None   Collection Time: 03/10/24 10:53 AM   Specimen: Nasal Mucosa; Nasal Swab  Result Value  Ref Range Status   MRSA by PCR Next Gen NOT DETECTED NOT DETECTED Final    Comment: (NOTE) The GeneXpert MRSA Assay (FDA approved for NASAL specimens only), is one component of a comprehensive MRSA colonization surveillance program. It is not intended to diagnose MRSA infection nor to guide or monitor treatment for MRSA infections. Test performance is not FDA approved in patients less than 21 years old. Performed at [facility], [address] [place], [place] [address]      Studies: No results found.   Adebayo Ensminger, MD  [facility] 03/15/2024  If 7PM-7AM, please contact night-coverage

## 2024-03-15 NOTE — Plan of Care (Signed)
  Problem: Education: Goal: Knowledge of General Education information will improve Description: Including pain rating scale, medication(s)/side effects and non-pharmacologic comfort measures Outcome: Progressing   Problem: Health Behavior/Discharge Planning: Goal: Ability to manage health-related needs will improve Outcome: Progressing   Problem: Clinical Measurements: Goal: Ability to maintain clinical measurements within normal limits will improve Outcome: Progressing Goal: Will remain free from infection Outcome: Progressing Goal: Diagnostic test results will improve Outcome: Progressing Goal: Respiratory complications will improve Outcome: Progressing Goal: Cardiovascular complication will be avoided Outcome: Progressing   Problem: Activity: Goal: Risk for activity intolerance will decrease Outcome: Progressing   Problem: Nutrition: Goal: Adequate nutrition will be maintained Outcome: Progressing   Problem: Elimination: Goal: Will not experience complications related to bowel motility Outcome: Progressing   Problem: Pain Managment: Goal: General experience of comfort will improve and/or be controlled Outcome: Progressing

## 2024-03-16 DIAGNOSIS — I5021 Acute systolic (congestive) heart failure: Secondary | ICD-10-CM | POA: Diagnosis not present

## 2024-03-16 DIAGNOSIS — I471 Supraventricular tachycardia, unspecified: Secondary | ICD-10-CM

## 2024-03-16 LAB — GLUCOSE, CAPILLARY
Glucose-Capillary: 150 mg/dL — ABNORMAL HIGH (ref 70–99)
Glucose-Capillary: 167 mg/dL — ABNORMAL HIGH (ref 70–99)
Glucose-Capillary: 185 mg/dL — ABNORMAL HIGH (ref 70–99)
Glucose-Capillary: 148 mg/dL — ABNORMAL HIGH (ref 70–99)

## 2024-03-16 LAB — CBC
HCT: 32.1 % — ABNORMAL LOW (ref 36.0–46.0)
Hemoglobin: 10.1 g/dL — ABNORMAL LOW (ref 12.0–15.0)
MCH: 32.8 pg (ref 26.0–34.0)
MCHC: 31.5 g/dL (ref 30.0–36.0)
MCV: 104.2 fL — ABNORMAL HIGH (ref 80.0–100.0)
Platelets: 88 K/uL — ABNORMAL LOW (ref 150–400)
RBC: 3.08 MIL/uL — ABNORMAL LOW (ref 3.87–5.11)
RDW: 14.3 % (ref 11.5–15.5)
WBC: 1.8 K/uL — ABNORMAL LOW (ref 4.0–10.5)
nRBC: 0 % (ref 0.0–0.2)

## 2024-03-16 LAB — BASIC METABOLIC PANEL WITH GFR
Anion gap: 11 (ref 5–15)
BUN: 33 mg/dL — ABNORMAL HIGH (ref 8–23)
CO2: 32 mmol/L (ref 22–32)
Calcium: 9.3 mg/dL (ref 8.9–10.3)
Chloride: 94 mmol/L — ABNORMAL LOW (ref 98–111)
Creatinine, Ser: 0.93 mg/dL (ref 0.44–1.00)
GFR, Estimated: >60 mL/min (ref >60)
Glucose, Bld: 114 mg/dL — ABNORMAL HIGH (ref 70–99)
Potassium: 4.4 mmol/L (ref 3.5–5.1)
Sodium: 137 mmol/L (ref 135–145)

## 2024-03-16 LAB — MAGNESIUM: Magnesium: 2.5 mg/dL — ABNORMAL HIGH (ref 1.7–2.4)

## 2024-03-16 LAB — PHOSPHORUS: Phosphorus: 3 mg/dL (ref 2.5–4.6)

## 2024-03-16 NOTE — Plan of Care (Signed)

## 2024-03-16 NOTE — Progress Notes (Signed)
 PROGRESS NOTE  ARABELLE Davidson FMW:[id number] DOB: 16-Jun-1946 DOA: 03/08/2024 PCP: Patient, [name]   LOS: 8 days   Brief narrative:  Ms. Jean Davidson is a 78 year old female with past medical history of relapsed/Refractory IgA Lambda Multiple Myeloma, GERD, HTN, macular degeneration, cervical spinal stenosis presented to hospital with shortness of breath.  Patient was recently seen as outpatient for low-grade fever and was presumed to have UTI and received ciprofloxacin .  On this presentation she presented with progressive shortness of breath nonproductive cough and low-grade fever of 101.  In the ED, patient was slightly tachycardic and needed supplemental oxygen .  Chest x-ray did not show any infiltrate.  Subsequently CT angiogram of the chest was done which showed negative for PE but bilateral airspace disease consistent with multifocal pneumonia..  Initial labs were notable for WBC at 4.2 with 2% bands,on elranatamab .  COVID, flu, RSV negative.  Procalcitonin was negative.  Patient had elevated lactate on presentation with trended up to 5.3.  Received 3-1/2 L of IV fluids in the ED.  Continue vancomycin  cefepime  and Flagyl .  Given acute respiratory failure, patient also required BiPAP due to increased work of breathing and was admitted to the hospital for further evaluation and treatment.     Assessment/Plan: Principal Problem:   Severe sepsis (HCC) Active Problems:   Acute hypoxic respiratory failure (HCC)   CAP (community acquired pneumonia)   Multiple myeloma in remission (HCC)   Essential hypertension   Anemia due to antineoplastic chemotherapy   Diarrhea   GERD (gastroesophageal reflux disease)   Peripheral neuropathy due to chemotherapy (HCC)  Severe sepsis likely secondary to multifocal community-acquired pneumonia. Patient received vancomycin , cefepime  and Flagyl  during hospitalization and currently has completed the course.  PCCM and  ID were on board. Patient had not been taking   atovaquone  at home due to taste (takes every couple days).  Currently on  nasal cannula at 2 L/min.  Appears to be stable at this time.   Acute hypoxic respiratory failure secondary to severe multifocal pneumonia and heart failure.  PE was ruled out.  Was initially on high flow nasal cannula and BiPA, Solu-Medrol .  PCCM followed the patient during hospitalization.  Patient has completed course of antibiotic and steroids at this time.   Currently on at 2 L/min.  2D echocardiogram was performed on 03/11/2024 with LV ejection fraction of 35 to 40% with no regional wall motion abnormality and grade 1 diastolic dysfunction.  Cardiology has signed off at this time.  Recommend follow-up with cardiology as outpatient.  Recommend continuation of metoprolol  XL 25 mg daily, Lasix  80 daily.  GDMT limited by soft BP.  Acute systolic heart failure   New findings of LV ejection fraction of 35 to 40%.  Was intially on 80 mg of IV Lasix  twice daily with good diuresis.  Has been changed to lasix  80 mg orally daily. Patient is negative balance for 3606 mL so far and had total urinary output of 1175 mL in 24 hours.  Will continue to wean oxygen  as able.   On metoprolol  25 mg daily. Cardiology on board.    Relapsed multiple myeloma with acquired hypogammaglobinemia Follows up with Dr. Federico oncology as outpatient. - 2016: Underwent autologous stem cell transplant. 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 10/16/2022: Completed CAR-T therapy with [employer]. 09/2023: Started on Elrexfio  due to progression of disease. 01/02/2024: Last visit at [facility] where induction therapy was completed - Now on Elranatamab  (Elrexfio ); just had cycle 6 on 02/27/24 -  Continue atovaquone  and acyclovir  Previous provider had spoken with Dr. Federico on 03/11/2024; patient due for IVIG on 03/12/2024.  Oncology on board.  Plan for IVIG today.  Mild hypokalemia.  Improved after replacement.  Will continue to monitor closely.   Anemia due  to antineoplastic chemotherapy Hemoglobin today at 10.1  Continue to monitor   Essential hypertension Continue IV diuretics and metoprolol .  Latest blood pressure 119/68   Diarrhea. History of chronic diarrhea.  Was recently on ciprofloxacin .  Continue as needed Imodium  Lomotil .    GERD (gastroesophageal reflux disease) Continue Protonix   Debility weakness.  Seen by physical therapy and recommend home health on discharge but patient wishes short-term placement..  Acute urinary retention.  Required In-N-Out catheter x 2 so was put on a Foley catheter.  Will try voiding trial likely on 03/17/2024.  DVT prophylaxis: SCDs Start: 03/08/24 1347   Disposition: Home with home health as per PT evaluation but patient requesting  placement.  Patient states that she lives alone by herself and is unable to take care of herself.  Status is: Inpatient Remains inpatient appropriate because: Pending clinical improvement, supplemental oxygen , need for rehabitation, Foley catheter..  Code Status:     Code Status: Do not attempt resuscitation (DNR) PRE-ARREST INTERVENTIONS DESIRED  Family Communication: Unable to reach the patient's daughter on 03/14/2024  Consultants: PCCM Cardiology Oncology  Procedures: BiPAP  Anti-infectives:  Acyclovir , atovaquone   Anti-infectives (From admission, onward)    Start     Dose/Rate Route Frequency Ordered Stop   03/10/24 1100  azithromycin  (ZITHROMAX ) 500 mg in sodium chloride  0.9 % 250 mL IVPB        500 mg 250 mL/hr over 60 Minutes Intravenous Every 24 hours 03/10/24 0928 03/12/24 1120   03/09/24 1200  vancomycin  (VANCOCIN ) IVPB 1000 mg/200 mL premix  Status:  Discontinued        1,000 mg 200 mL/hr over 60 Minutes Intravenous Every 24 hours 03/08/24 1554 03/10/24 2102   03/09/24 0000  ceFEPIme  (MAXIPIME ) 2 g in sodium chloride  0.9 % 100 mL IVPB        2 g 200 mL/hr over 30 Minutes Intravenous Every 12 hours 03/08/24 1554 03/12/24 1233   03/08/24  2200  metroNIDAZOLE  (FLAGYL ) IVPB 500 mg        500 mg 100 mL/hr over 60 Minutes Intravenous Every 12 hours 03/08/24 1523 03/12/24 2219   03/08/24 2200  acyclovir  (ZOVIRAX ) tablet 400 mg        400 mg Oral 2 times daily 03/08/24 1643     03/08/24 2200  atovaquone  (MEPRON ) 750 MG/5ML suspension 1,500 mg        1,500 mg Oral 2 times daily 03/08/24 1846     03/08/24 1115  ceFEPIme  (MAXIPIME ) 2 g in sodium chloride  0.9 % 100 mL IVPB        2 g 200 mL/hr over 30 Minutes Intravenous  Once 03/08/24 1112 03/08/24 1408   03/08/24 1115  metroNIDAZOLE  (FLAGYL ) IVPB 500 mg        500 mg 100 mL/hr over 60 Minutes Intravenous  Once 03/08/24 1112 03/08/24 1302   03/08/24 1115  vancomycin  (VANCOCIN ) IVPB 1000 mg/200 mL premix        1,000 mg 200 mL/hr over 60 Minutes Intravenous  Once 03/08/24 1112 03/08/24 1533       Subjective: Today, patient was seen and examined at bedside.  Patient complains of fatigue and weakness but denies overt pain nausea or vomiting.  Objective: Vitals:  03/16/24 0144 03/16/24 0459  BP: 118/70 119/68  Pulse: 88 92  Resp: 16 16  Temp: 97.6 F (36.4 C) 98.2 F (36.8 C)  SpO2: 96% 93%    Intake/Output Summary (Last 24 hours) at 03/16/2024 1105 Last data filed at 03/16/2024 0842 Gross per 24 hour  Intake 490 ml  Output 850 ml  Net -360 ml   Filed Weights   03/11/24 1325 03/13/24 1626 03/15/24 0640  Weight: 76.1 kg 74.1 kg 69.7 kg   Body mass index is 28.1 kg/m.   Physical Exam:  General:  Average built, not in obvious distress, on 2 L of oxygen  by nasal cannula, elderly female HENT:   Mild pallor noted.  Oral mucosa is moist.  Chest: Decreased breath sounds bilaterally.  Coarse breath sounds noted. CVS: S1 &S2 heard. No murmur.  Regular rate and rhythm. Abdomen: Soft, nontender, nondistended.  Bowel sounds are heard.   Extremities: No cyanosis, clubbing with trace bilateral lower extremity edema.  Peripheral pulses are palpable. Psych: Alert, awake and  oriented, normal mood CNS:  No cranial nerve deficits.  Moves all extremities. Skin: Warm and dry.  No rashes noted.  Data Review: I have personally reviewed the following laboratory data and studies,  CBC: Recent Labs  Lab 03/10/24 0258 03/11/24 0430 03/12/24 0308 03/13/24 0312 03/15/24 0308 03/16/24 0401  WBC 6.2 7.2 5.1 4.1 3.7* 1.8*  NEUTROABS 4.9 5.1 4.3 2.7  --   --   HGB 8.7* 8.7* 8.9* 9.0* 10.2* 10.1*  HCT 27.5* 28.2* 29.7* 29.3* 32.6* 32.1*  MCV 105.0* 104.4* 106.1* 105.8* 103.8* 104.2*  PLT 97* 103* 102* 99* 101* 88*   Basic Metabolic Panel: Recent Labs  Lab 03/11/24 1333 03/12/24 0308 03/13/24 0312 03/14/24 0310 03/15/24 0308 03/16/24 0401  NA 141 144 143 142 140 137  K 3.2* 4.3 3.7 3.5 3.3* 4.4  CL 104 106 100 94* 91* 94*  CO2 24 26 32 33* 35* 32  GLUCOSE 186* 144* 131* 133* 121* 114*  BUN 22 21 21  24* 28* 33*  CREATININE 0.71 0.63 0.74 0.74 0.79 0.93  CALCIUM  8.9 8.8* 8.5* 9.0 9.2 9.3  MG 2.0 2.1 1.8  --  2.2 2.5*  PHOS  --   --   --   --   --  3.0   Liver Function Tests: No results for input(s): AST, ALT, ALKPHOS, BILITOT, PROT, ALBUMIN in the last 168 hours.  No results for input(s): LIPASE, AMYLASE in the last 168 hours. No results for input(s): AMMONIA in the last 168 hours. Cardiac Enzymes: No results for input(s): CKTOTAL, CKMB, CKMBINDEX, TROPONINI in the last 168 hours. BNP (last 3 results) No results for input(s): BNP in the last 8760 hours.  ProBNP (last 3 results) Recent Labs    03/10/24 0258  PROBNP 16,972.0*    CBG: Recent Labs  Lab 03/15/24 0833 03/15/24 1203 03/15/24 1643 03/15/24 2151 03/16/24 0752  GLUCAP 162* 139* 118* 128* 150*   Recent Results (from the past 240 hours)  Resp panel by RT-PCR (RSV, Flu A&B, Covid) Anterior Nasal Swab     Status: None   Collection Time: 03/08/24 11:38 AM   Specimen: Anterior Nasal Swab  Result Value Ref Range Status   SARS Coronavirus 2 by RT PCR  NEGATIVE NEGATIVE Final    Comment: (NOTE) SARS-CoV-2 target nucleic acids are NOT DETECTED.  The SARS-CoV-2 RNA is generally detectable in upper respiratory specimens during the acute phase of infection. The lowest concentration of SARS-CoV-2 viral copies  this assay can detect is 138 copies/mL. A negative result does not preclude SARS-Cov-2 infection and should not be used as the sole basis for treatment or other patient management decisions. A negative result may occur with  improper specimen collection/handling, submission of specimen other than nasopharyngeal swab, presence of viral mutation(s) within the areas targeted by this assay, and inadequate number of viral copies(<138 copies/mL). A negative result must be combined with clinical observations, patient history, and epidemiological information. The expected result is Negative.  Fact Sheet for Patients:  BloggerCourse.com  Fact Sheet for Healthcare Providers:  SeriousBroker.it  This test is no t yet approved or cleared by the [place]  FDA and  has been authorized for detection and/or diagnosis of SARS-CoV-2 by FDA under an Emergency Use Authorization (EUA). This EUA will remain  in effect (meaning this test can be used) for the duration of the COVID-19 declaration under Section 564(b)(1) of the Act, 21 U.S.C.section 360bbb-3(b)(1), unless the authorization is terminated  or revoked sooner.       Influenza A by PCR NEGATIVE NEGATIVE Final   Influenza B by PCR NEGATIVE NEGATIVE Final    Comment: (NOTE) The Xpert Xpress SARS-CoV-2/FLU/RSV plus assay is intended as an aid in the diagnosis of influenza from Nasopharyngeal swab specimens and should not be used as a sole basis for treatment. Nasal washings and aspirates are unacceptable for Xpert Xpress SARS-CoV-2/FLU/RSV testing.  Fact Sheet for Patients: BloggerCourse.com  Fact Sheet for  Healthcare Providers: SeriousBroker.it  This test is not yet approved or cleared by the [place]  FDA and has been authorized for detection and/or diagnosis of SARS-CoV-2 by FDA under an Emergency Use Authorization (EUA). This EUA will remain in effect (meaning this test can be used) for the duration of the COVID-19 declaration under Section 564(b)(1) of the Act, 21 U.S.C. section 360bbb-3(b)(1), unless the authorization is terminated or revoked.     Resp Syncytial Virus by PCR NEGATIVE NEGATIVE Final    Comment: (NOTE) Fact Sheet for Patients: BloggerCourse.com  Fact Sheet for Healthcare Providers: SeriousBroker.it  This test is not yet approved or cleared by the [place]  FDA and has been authorized for detection and/or diagnosis of SARS-CoV-2 by FDA under an Emergency Use Authorization (EUA). This EUA will remain in effect (meaning this test can be used) for the duration of the COVID-19 declaration under Section 564(b)(1) of the Act, 21 U.S.C. section 360bbb-3(b)(1), unless the authorization is terminated or revoked.  Performed at [facility], [address] [place], [place] [address]   Respiratory (~20 pathogens) panel by PCR     Status: None   Collection Time: 03/08/24 11:38 AM   Specimen: Nasopharyngeal Swab; Respiratory  Result Value Ref Range Status   Adenovirus NOT DETECTED NOT DETECTED Final   Coronavirus 229E NOT DETECTED NOT DETECTED Final    Comment: (NOTE) The Coronavirus on the Respiratory Panel, DOES NOT test for the novel  Coronavirus (2019 nCoV)    Coronavirus HKU1 NOT DETECTED NOT DETECTED Final   Coronavirus NL63 NOT DETECTED NOT DETECTED Final   Coronavirus OC43 NOT DETECTED NOT DETECTED Final   Metapneumovirus NOT DETECTED NOT DETECTED Final   Rhinovirus / Enterovirus NOT DETECTED NOT DETECTED Final   Influenza A NOT DETECTED NOT DETECTED Final    Influenza B NOT DETECTED NOT DETECTED Final   Parainfluenza Virus 1 NOT DETECTED NOT DETECTED Final   Parainfluenza Virus 2 NOT DETECTED NOT DETECTED Final   Parainfluenza Virus 3 NOT DETECTED NOT DETECTED Final  Parainfluenza Virus 4 NOT DETECTED NOT DETECTED Final   Respiratory Syncytial Virus NOT DETECTED NOT DETECTED Final   Bordetella pertussis NOT DETECTED NOT DETECTED Final   Bordetella Parapertussis NOT DETECTED NOT DETECTED Final   Chlamydophila pneumoniae NOT DETECTED NOT DETECTED Final   Mycoplasma pneumoniae NOT DETECTED NOT DETECTED Final    Comment: Performed at [facility], [address], [place], [place] [address]  Blood Culture (routine x 2)     Status: None   Collection Time: 03/08/24  1:34 PM   Specimen: BLOOD LEFT ARM  Result Value Ref Range Status   Specimen Description   Final    BLOOD LEFT ARM Performed at [facility], [address] [place], [place] [address]    Special Requests   Final    BOTTLES DRAWN AEROBIC ONLY Blood Culture results may not be optimal due to an inadequate volume of blood received in culture bottles Performed at [facility], [address] [place], [place] [address]    Culture   Final    NO GROWTH 5 DAYS Performed at [facility], [address], [place], [place] [address]    Report Status 03/13/2024 FINAL  Final  Blood Culture (routine x 2)     Status: None   Collection Time: 03/08/24  1:58 PM   Specimen: BLOOD LEFT ARM  Result Value Ref Range Status   Specimen Description   Final    BLOOD LEFT ARM Performed at [facility], [address], [place], [place] [address]    Special Requests   Final    BOTTLES DRAWN AEROBIC AND ANAEROBIC Blood Culture adequate volume Performed at [facility], [address] [place], [place] [address]    Culture   Final    NO GROWTH 5 DAYS Performed at [facility], [address], [place], [place] [address]    Report Status  03/13/2024 FINAL  Final  MRSA Next Gen by PCR, Nasal     Status: None   Collection Time: 03/10/24 10:53 AM   Specimen: Nasal Mucosa; Nasal Swab  Result Value Ref Range Status   MRSA by PCR Next Gen NOT DETECTED NOT DETECTED Final    Comment: (NOTE) The GeneXpert MRSA Assay (FDA approved for NASAL specimens only), is one component of a comprehensive MRSA colonization surveillance program. It is not intended to diagnose MRSA infection nor to guide or monitor treatment for MRSA infections. Test performance is not FDA approved in patients less than 30 years old. Performed at [facility], [address], [place], [place] [address]      Studies: No results found.   Dejia Ebron, MD  [facility] 03/16/2024  If 7PM-7AM, please contact night-coverage

## 2024-03-16 NOTE — Progress Notes (Signed)
 Rounding Note   Patient Name: Jean Davidson Date of Encounter: 03/16/2024  [facility] Cardiologist: Deatrice Cage, MD   Subjective Net negative about 600 cc overnight - overall 4.2L Negative on oral lasix .  Scheduled Meds:  acyclovir   400 mg Oral BID   aspirin  EC  81 mg Oral Daily   atovaquone   1,500 mg Oral BID   Chlorhexidine  Gluconate Cloth  6 each Topical Daily   cholecalciferol   4,000 Units Oral Daily   furosemide   80 mg Oral Daily   insulin  aspart  0-6 Units Subcutaneous TID AC & HS   metoprolol  succinate  25 mg Oral Daily   mirtazapine   15 mg Oral QHS   multivitamin  2 tablet Oral BID   pantoprazole   40 mg Oral Daily   saccharomyces boulardii  250 mg Oral BID   Continuous Infusions:   PRN Meds: acetaminophen  **OR** acetaminophen , antiseptic oral rinse, calcium  carbonate, diphenoxylate -atropine , guaiFENesin -dextromethorphan , hydrALAZINE , labetalol , levalbuterol , loperamide , melatonin, metoprolol  tartrate, morphine  injection, Muscle Rub, ondansetron  **OR** ondansetron  (ZOFRAN ) IV, mouth rinse, polyethylene glycol, sodium chloride  flush   Vital Signs  Vitals:   03/15/24 2000 03/15/24 2133 03/16/24 0144 03/16/24 0459  BP: 124/84 (!) 123/109 118/70 119/68  Pulse: 98 77 88 92  Resp: 20 20 16 16   Temp:  98.4 F (36.9 C) 97.6 F (36.4 C) 98.2 F (36.8 C)  TempSrc:  Oral Oral Oral  SpO2: 94% 97% 96% 93%  Weight:      Height:        Intake/Output Summary (Last 24 hours) at 03/16/2024 1032 Last data filed at 03/16/2024 0842 Gross per 24 hour  Intake 490 ml  Output 850 ml  Net -360 ml      03/15/2024    6:40 AM 03/13/2024    4:26 PM 03/11/2024    1:25 PM  Last 3 Weights  Weight (lbs) 153 lb 10.6 oz 163 lb 5.8 oz 167 lb 12.3 oz  Weight (kg) 69.7 kg 74.1 kg 76.1 kg      Telemetry NSR- Personally Reviewed  ECG  N/A  Physical Exam  General appearance: alert and no distress Neck: no carotid bruit, no JVD, and thyroid  not enlarged, symmetric, no  tenderness/mass/nodules Lungs: diminished breath sounds bibasilar Heart: regular rate and rhythm Extremities: edema trace bilateral Neurologic: Grossly normal  Labs High Sensitivity Troponin:  No results for input(s): TROPONINIHS in the last 720 hours.   Chemistry Recent Labs  Lab 03/13/24 0312 03/14/24 0310 03/15/24 0308 03/16/24 0401  NA 143 142 140 137  K 3.7 3.5 3.3* 4.4  CL 100 94* 91* 94*  CO2 32 33* 35* 32  GLUCOSE 131* 133* 121* 114*  BUN 21 24* 28* 33*  CREATININE 0.74 0.74 0.79 0.93  CALCIUM  8.5* 9.0 9.2 9.3  MG 1.8  --  2.2 2.5*  GFRNONAA >60 >60 >60 >60  ANIONGAP 12 15 14 11     Lipids No results for input(s): CHOL, TRIG, HDL, LABVLDL, LDLCALC, CHOLHDL in the last 168 hours.  Hematology Recent Labs  Lab 03/13/24 0312 03/15/24 0308 03/16/24 0401  WBC 4.1 3.7* 1.8*  RBC 2.77* 3.14* 3.08*  HGB 9.0* 10.2* 10.1*  HCT 29.3* 32.6* 32.1*  MCV 105.8* 103.8* 104.2*  MCH 32.5 32.5 32.8  MCHC 30.7 31.3 31.5  RDW 14.5 14.6 14.3  PLT 99* 101* 88*   Thyroid  No results for input(s): TSH, FREET4 in the last 168 hours.  BNP Recent Labs  Lab 03/10/24 0258  PROBNP 16,972.0*  DDimer  No results for input(s): DDIMER in the last 168 hours.    Radiology  No results found.  Cardiac Studies Echocardiogram 03/11/2024: Impressions: 1. Left ventricular ejection fraction, by estimation, is 35 to 40%. The  left ventricle has moderately decreased function. The left ventricle has  no regional wall motion abnormalities. Left ventricular diastolic  parameters are consistent with Grade I  diastolic dysfunction (impaired relaxation).   2. Right ventricular systolic function is normal. The right ventricular  size is normal. There is mildly elevated pulmonary artery systolic  pressure.   3. The mitral valve is normal in structure. Moderate mitral valve  regurgitation. No evidence of mitral stenosis.   4. The aortic valve is normal in structure. Aortic  valve regurgitation is  not visualized. No aortic stenosis is present.   5. The inferior vena cava is normal in size with greater than 50%  respiratory variability, suggesting right atrial pressure of 3 mmHg.    Patient Profile   78 y.o. female with a history of paroxysmal SVT, remote large pericardial effusion (resolved without intervention), mild carotid stenosis, hypertension, GERD, chronic anemia, relapsed/ refractory multiple myeloma on Elrexfio  who was admitted on 03/08/2024 for severe sepsis and acute hypoxic respiratory failure secondary to pneumonia. However, work-up also revealed markedly elevated pro-BNP and reduced EF. Cardiology consulted on 03/12/2024 for evaluation of acute CHF.  Assessment & Plan   Acute HFrEF Pro BNP markedly elevated at 16,972. Chest x-ray showed now acute findings. Chest CTA negative for PE but showed extensive bilateral airspace infiltrates consistent with multifocal infection or aspiration as well as small bilateral pleural effusions. Echo showed LVEF of 35-40% with no regional wall motion abnormalities and grade 1 diastolic dysfunction, normal RV function, moderate MR. Patient being diuresed with IV Lasix . Documented urinary output of 3.65 L yesterday and net negative 2.4 L this admission. Weight down 4 lbs yesterday. No updated weight today. Renal function stable. - Continue Lasix  80 mg p.o. daily - Potassium normal - GDMT limited by soft BP, however, that is improving  - Continue Toprol -XL 25 mg daily - Continue to monitor daily weights, strict I/Os, and renal function.  - Etiology unclear. 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 She does have a strong family history of CAD. Would favor letting her recover from current illness and then we can consider ischemic evaluation as an outpatient.   Elevated Troponin High-sensitivity troponin elevated but flat at 669 >> 649. Repeat EKG on 03/13/2024 showed sinus tachycardia, rate 102 bpm,  with slight ST depressions and non-specific T wave changes in lateral leads. Echo shows newly reduced EF of 35-40% but no regional wall motion abnormalities. - No chest pain. - Troponin elevation not felt to be ACS. Felt to be demand ischemia in setting of acute illness. Can consider ischemic evaluation as an outpatient once she recovers from current illness.  Paroxysmal SVT History of paroxysmal SVT.  - Telemetry shows sinus rhythm with rates in 90s to low 100s but no SVT.  - On Toprol -XL to 25mg  daily, now with sinus rhythm to sinus tachycardia  Moderate Mitral Regurgitation Noted on Echo this admission. - Will need to continue to follow as an outpatient.   Otherwise, per primary team: - Severe sepsis secondary to pneumonia - Acute hypoxic respiratory failure - Chronic anemia - Diarrhea - GERD - Multiple myeloma  [facility] will sign off.   Medication Recommendations:  as above Other recommendations (labs, testing, etc):  none Follow up as an  outpatient:  Dr. Darron or APP - will schedule follow-up   For questions or updates, please contact [facility] Please consult [email or web address] for contact info under   Vinie KYM Maxcy, MD, [facility], FNLA, FACP  [place]  [facility]  Medical Director of the [facility]  [facility] Diplomate of the American Board of Clinical Lipidology Attending Cardiologist  Direct Dial: [phone]  Fax: [phone]  Website:  [email or web address]  Vinie JAYSON Maxcy, MD  03/16/2024, 10:32 AM

## 2024-03-16 NOTE — Plan of Care (Signed)

## 2024-03-17 LAB — GLUCOSE, CAPILLARY
Glucose-Capillary: 110 mg/dL — ABNORMAL HIGH (ref 70–99)
Glucose-Capillary: 170 mg/dL — ABNORMAL HIGH (ref 70–99)
Glucose-Capillary: 120 mg/dL — ABNORMAL HIGH (ref 70–99)
Glucose-Capillary: 216 mg/dL — ABNORMAL HIGH (ref 70–99)

## 2024-03-17 LAB — KAPPA/LAMBDA LIGHT CHAINS
Kappa free light chain: <0.7 mg/L — ABNORMAL LOW (ref 3.3–19.4)
Kappa, lambda light chain ratio: UNDETERMINED
Lambda free light chains: <1.5 mg/L — ABNORMAL LOW (ref 5.7–26.3)

## 2024-03-17 NOTE — Progress Notes (Signed)
 Physical Therapy Treatment Patient Details Name: Jean Davidson MRN: [id number] DOB: 1946/05/17 Today's Date: 03/17/2024   History of Present Illness Jean Davidson is a 78 year old female admitted to [facility] with CAP, acute systolic HF and sepsis. PMH: relapsed/Refractory IgA Lambda Multiple Myeloma, In Remission, GERD, HTN, macular degeneration, cervical spinal stenosis    PT Comments  Pt tolerated increased ambulation distance of 60' with RW, SpO2 100% on 4L O2 walking, 2/4 dyspnea walking, no loss of balance, distance limited by fatigue. Pt performed seated BLE exercises for strengthening. Pt puts forth good effort.     If plan is discharge home, recommend the following: A little help with walking and/or transfers;A little help with bathing/dressing/bathroom;Assistance with cooking/housework;Assist for transportation;Help with stairs or ramp for entrance   Can travel by private vehicle     Yes  Equipment Recommendations       Recommendations for Other Services       Precautions / Restrictions Precautions Precautions: Fall Recall of Precautions/Restrictions: Intact Precaution/Restrictions Comments: monitor SpO2 Restrictions Weight Bearing Restrictions Per Provider Order: No     Mobility  Bed Mobility Overal bed mobility: Needs Assistance Bed Mobility: Supine to Sit     Supine to sit: Min assist     General bed mobility comments: min A to raise trunk, min A for BLEs into bed    Transfers Overall transfer level: Needs assistance Equipment used: Rolling walker (2 wheels)   Sit to Stand: Contact guard assist           General transfer comment: VCs hand placement    Ambulation/Gait Ambulation/Gait assistance: Contact guard assist Gait Distance (Feet): 60 Feet Assistive device: Rolling walker (2 wheels) Gait Pattern/deviations: Step-through pattern, Decreased stride length, Trunk flexed Gait velocity: decr     General Gait Details: VCs for posture, SpO2 100% on 4L  O2 walking, 2/4 dyspnea  walking, distance limited by fatigue   Stairs             Wheelchair Mobility     Tilt Bed    Modified Rankin (Stroke Patients Only)       Balance Overall balance assessment: Needs assistance Sitting-balance support: No upper extremity supported, Feet supported Sitting balance-Leahy Scale: Fair     Standing balance support: During functional activity, Bilateral upper extremity supported Standing balance-Leahy Scale: Fair                              Hotel manager: No apparent difficulties  Cognition Arousal: Alert Behavior During Therapy: WFL for tasks assessed/performed                             Following commands: Intact      Cueing Cueing Techniques: Verbal cues  Exercises General Exercises - Lower Extremity Ankle Circles/Pumps: AROM, Both, Supine Long Arc Quad: AROM, 10 reps, Both, Seated Hip Flexion/Marching: AROM, Both, 10 reps, Seated    General Comments        Pertinent Vitals/Pain Pain Assessment Pain Assessment: No/denies pain    Home Living                          Prior Function            PT Goals (current goals can now be found in the care plan section) Acute Rehab PT Goals Patient Stated Goal: to be able to walk  farther, 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 PT Goal Formulation: With patient Time For Goal Achievement: 03/27/24 Potential to Achieve Goals: Good Progress towards PT goals: Progressing toward goals    Frequency    Min 3X/week      PT Plan      Co-evaluation              AM-PAC PT 6 Clicks Mobility   Outcome Measure  Help needed turning from your back to your side while in a flat bed without using bedrails?: None Help needed moving from lying on your back to sitting on the side of a flat bed without using bedrails?: A Little Help needed moving to and from a bed to a chair (including a wheelchair)?: A  Little Help needed standing up from a chair using your arms (e.g., wheelchair or bedside chair)?: A Little Help needed to walk in hospital room?: A Little Help needed climbing 3-5 steps with a railing? : A Lot 6 Click Score: 18    End of Session Equipment Utilized During Treatment: Gait belt;Oxygen  Activity Tolerance: Patient limited by fatigue Patient left: in bed;with bed alarm set;with call bell/phone within reach Nurse Communication: Mobility status PT Visit Diagnosis: Unsteadiness on feet (R26.81);Muscle weakness (generalized) (M62.81);Difficulty in walking, not elsewhere classified (R26.2)     Time: 8584-8555 PT Time Calculation (min) (ACUTE ONLY): 29 min  Charges:    $Gait Training: 8-22 mins $Therapeutic Exercise: 8-22 mins PT General Charges $$ ACUTE PT VISIT: 1 Visit                     Sylvan Delon Copp PT 03/17/2024  Acute Rehabilitation Services  Office [phone]

## 2024-03-17 NOTE — TOC Progression Note (Signed)
 Transition of Care [facility]) - Progression Note    Patient Details  Name: Jean Davidson MRN: [id number] Date of Birth: 10-31-1945  [facility] CM/SW Contact  Tawni CHRISTELLA Eva, LCSW Phone Number: 03/17/2024, 2:40 PM  Clinical Narrative:     CSW presented bed offers for pt to review. IP care management to follow.      Expected Discharge Plan:  (TBD) Barriers to Discharge: Continued Medical Work up               Expected Discharge Plan and Services In-house Referral: NA Discharge Planning Services: CM Consult Post Acute Care Choice: NA Living arrangements for the past 2 months: Single Family Home                 DME Arranged: N/A DME Agency: NA       HH Arranged: NA HH Agency: NA         Social Drivers of Health (SDOH) Interventions SDOH Screenings   Food Insecurity: No Food Insecurity (03/09/2024)  Housing: Low Risk  (03/09/2024)  Transportation Needs: No Transportation Needs (03/09/2024)  Utilities: Not At Risk (03/09/2024)  Depression (PHQ2-9): Low Risk  (03/05/2024)  Recent Concern: Depression (PHQ2-9) - Medium Risk (02/21/2024)  Financial Resource Strain: Low Risk  (10/09/2023)   Received from [facility]  Social Connections: Moderately Integrated (03/09/2024)  Tobacco Use: Medium Risk (03/09/2024)    Readmission Risk Interventions     No data to display

## 2024-03-17 NOTE — Progress Notes (Signed)
 PROGRESS NOTE  Jean Davidson FMW:[id number] DOB: [birth year] DOA: 03/08/2024 PCP: Patient, [name]   LOS: 9 days   Brief narrative:  Jean Davidson is a 78 year old female with past medical history of relapsed/Refractory IgA Lambda Multiple Myeloma, GERD, HTN, macular degeneration, cervical spinal stenosis presented to hospital with shortness of breath.  Patient was recently seen as outpatient for low-grade fever and was presumed to have UTI and received ciprofloxacin .  On this presentation she presented with progressive shortness of breath nonproductive cough and low-grade fever of 101.  In the ED, patient was slightly tachycardic and needed supplemental oxygen .  Chest x-ray did not show any infiltrate.  Subsequently CT angiogram of the chest was done which showed negative for PE but bilateral airspace disease consistent with multifocal pneumonia..  Initial labs were notable for WBC at 4.2 with 2% bands,on elranatamab .  COVID, flu, RSV negative.  Procalcitonin was negative.  Patient had elevated lactate on presentation with trended up to 5.3.  Received 3-1/2 L of IV fluids in the ED.  Continue vancomycin  cefepime  and Flagyl .  Given acute respiratory failure, patient also required BiPAP due to increased work of breathing and was admitted to the hospital for further evaluation and treatment.     Assessment/Plan: Principal Problem:   Severe sepsis (HCC) Active Problems:   Acute hypoxic respiratory failure (HCC)   CAP (community acquired pneumonia)   Multiple myeloma in remission (HCC)   Essential hypertension   Anemia due to antineoplastic chemotherapy   Diarrhea   GERD (gastroesophageal reflux disease)   Peripheral neuropathy due to chemotherapy (HCC)  Severe sepsis likely secondary to multifocal community-acquired pneumonia. Patient received vancomycin , cefepime  and Flagyl  during hospitalization and currently has completed the course.  PCCM and  ID were on board. Patient had not been taking   atovaquone  at home due to taste (takes every couple days).  Currently on  nasal cannula at 2 L/min.  Appears to be stable at this time.   Acute hypoxic respiratory failure secondary to severe multifocal pneumonia and heart failure.  PE was ruled out.  Was initially on high flow nasal cannula and BiPAP, Solu-Medrol .  PCCM followed the patient during hospitalization.  Patient has completed course of antibiotic and steroids at this time.   Currently on at 2 L/min.  2D echocardiogram was performed on 03/11/2024 with LV ejection fraction of 35 to 40% with no regional wall motion abnormality and grade 1 diastolic dysfunction.  Cardiology has signed off at this time.  Recommend follow-up with cardiology as outpatient.  Recommend continuation of metoprolol  XL 25 mg daily, Lasix  80 daily.  GDMT limited by soft BP.  Acute systolic heart failure   New findings of LV ejection fraction of 35 to 40%.  Was intially on 80 mg of IV Lasix  twice daily with good diuresis.  Has been changed to lasix  80 mg orally daily. Patient is negative balance for 5016 mL so far and had total urinary output of 1200 mL in 24 hours.  Will continue to wean oxygen  as able.   On metoprolol  25 mg daily. Cardiology on board.  Will continue to wean oxygen  as able.    Relapsed multiple myeloma with acquired hypogammaglobinemia Follows up with Dr. Federico oncology as outpatient. - 2016: Underwent autologous stem cell transplant. 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 10/16/2022: Completed CAR-T therapy with [facility]. 09/2023: Started on Elrexfio  due to progression of disease. 01/02/2024: Last visit at [facility] where induction therapy was completed - Now on  Elranatamab  (Elrexfio ); just had cycle 6 on 02/27/24 - Continue atovaquone  and acyclovir  Previous provider had spoken with Dr. Federico on 03/11/2024; patient due for IVIG on 03/12/2024.  Oncology on board.  Status post IVIG administration.  Hypokalemia.  Resolved after replacement.   Latest potassium of 4.2.   Anemia due to antineoplastic chemotherapy Leukopenia/thrombocytopenia. Latest hemoglobin of 10.1.  Will continue to monitor.  No evidence of bleeding.   Essential hypertension Continue IV diuretics and metoprolol .  Latest blood pressure of 118/70.   Diarrhea. History of chronic diarrhea.  Was recently on ciprofloxacin .  Continue as needed Imodium  Lomotil .    GERD (gastroesophageal reflux disease) Continue Protonix   Debility weakness.  Seen by physical therapy and recommend home health on discharge but patient wishes short-term placement.  Acute urinary retention.  Required In-N-Out catheter x 2 so on a Foley catheter.  Foley catheter.  Requested voiding trial  on 03/17/2024 but patient has strongly refused to do that today.  I explained the high risk of infection with Foley catheter and she has understood this..  DVT prophylaxis: SCDs Start: 03/08/24 1347   Disposition: Skilled nursing facility at this time.  Patient states that she lives alone by herself and is unable to take care of herself.  Status is: Inpatient Remains inpatient appropriate because: Pending clinical improvement, supplemental oxygen , need for rehabitation, Foley catheter.  Code Status:     Code Status: Do not attempt resuscitation (DNR) PRE-ARREST INTERVENTIONS DESIRED  Family Communication: None at bedside.  Consultants: PCCM Cardiology Oncology  Procedures: BiPAP  Anti-infectives:  Acyclovir , atovaquone   Anti-infectives (From admission, onward)    Start     Dose/Rate Route Frequency Ordered Stop   03/10/24 1100  azithromycin  (ZITHROMAX ) 500 mg in sodium chloride  0.9 % 250 mL IVPB        500 mg 250 mL/hr over 60 Minutes Intravenous Every 24 hours 03/10/24 0928 03/12/24 1120   03/09/24 1200  vancomycin  (VANCOCIN ) IVPB 1000 mg/200 mL premix  Status:  Discontinued        1,000 mg 200 mL/hr over 60 Minutes Intravenous Every 24 hours 03/08/24 1554 03/10/24 2102   03/09/24  0000  ceFEPIme  (MAXIPIME ) 2 g in sodium chloride  0.9 % 100 mL IVPB        2 g 200 mL/hr over 30 Minutes Intravenous Every 12 hours 03/08/24 1554 03/12/24 1233   03/08/24 2200  metroNIDAZOLE  (FLAGYL ) IVPB 500 mg        500 mg 100 mL/hr over 60 Minutes Intravenous Every 12 hours 03/08/24 1523 03/12/24 2219   03/08/24 2200  acyclovir  (ZOVIRAX ) tablet 400 mg        400 mg Oral 2 times daily 03/08/24 1643     03/08/24 2200  atovaquone  (MEPRON ) 750 MG/5ML suspension 1,500 mg        1,500 mg Oral 2 times daily 03/08/24 1846     03/08/24 1115  ceFEPIme  (MAXIPIME ) 2 g in sodium chloride  0.9 % 100 mL IVPB        2 g 200 mL/hr over 30 Minutes Intravenous  Once 03/08/24 1112 03/08/24 1408   03/08/24 1115  metroNIDAZOLE  (FLAGYL ) IVPB 500 mg        500 mg 100 mL/hr over 60 Minutes Intravenous  Once 03/08/24 1112 03/08/24 1302   03/08/24 1115  vancomycin  (VANCOCIN ) IVPB 1000 mg/200 mL premix        1,000 mg 200 mL/hr over 60 Minutes Intravenous  Once 03/08/24 1112 03/08/24 1533  Subjective: Today, patient was seen and examined at bedside.  Patient states that she feels fatigued and weak.  Does not wish to do voiding trial despite explaining the risk of infection.  Denies any nausea vomiting fever chills or shortness of breath.  Objective: Vitals:   03/17/24 0940 03/17/24 1325  BP: 116/83 118/75  Pulse: (!) 108 93  Resp:  16  Temp:  98.7 F (37.1 C)  SpO2:  99%    Intake/Output Summary (Last 24 hours) at 03/17/2024 1410 Last data filed at 03/17/2024 1330 Gross per 24 hour  Intake 360 ml  Output 950 ml  Net -590 ml   Filed Weights   03/13/24 1626 03/15/24 0640 03/16/24 2000  Weight: 74.1 kg 69.7 kg 70.8 kg   Body mass index is 28.53 kg/m.   Physical Exam:  General:  Average built, not in obvious distress, on 2 L of oxygen  by nasal cannula, elderly female HENT: Pallor noted.  Oral mucosa is moist.  Chest: Decreased breath sounds bilateral.  Coarse breath sounds noted. CVS: S1  &S2 heard. No murmur.  Regular rate and rhythm. Abdomen: Soft, nontender, nondistended.  Bowel sounds are heard.   Extremities: No cyanosis, clubbing with trace bilateral lower extremity edema.  Peripheral pulses are palpable. Psych: Alert, awake and oriented, normal mood CNS:  No cranial nerve deficits.  Moves all extremities. Skin: Warm and dry.  No rashes noted.  Data Review: I have personally reviewed the following laboratory data and studies,  CBC: Recent Labs  Lab 03/11/24 0430 03/12/24 0308 03/13/24 0312 03/15/24 0308 03/16/24 0401  WBC 7.2 5.1 4.1 3.7* 1.8*  NEUTROABS 5.1 4.3 2.7  --   --   HGB 8.7* 8.9* 9.0* 10.2* 10.1*  HCT 28.2* 29.7* 29.3* 32.6* 32.1*  MCV 104.4* 106.1* 105.8* 103.8* 104.2*  PLT 103* 102* 99* 101* 88*   Basic Metabolic Panel: Recent Labs  Lab 03/11/24 1333 03/12/24 0308 03/13/24 0312 03/14/24 0310 03/15/24 0308 03/16/24 0401  NA 141 144 143 142 140 137  K 3.2* 4.3 3.7 3.5 3.3* 4.4  CL 104 106 100 94* 91* 94*  CO2 24 26 32 33* 35* 32  GLUCOSE 186* 144* 131* 133* 121* 114*  BUN 22 21 21  24* 28* 33*  CREATININE 0.71 0.63 0.74 0.74 0.79 0.93  CALCIUM  8.9 8.8* 8.5* 9.0 9.2 9.3  MG 2.0 2.1 1.8  --  2.2 2.5*  PHOS  --   --   --   --   --  3.0   Liver Function Tests: No results for input(s): AST, ALT, ALKPHOS, BILITOT, PROT, ALBUMIN in the last 168 hours.  No results for input(s): LIPASE, AMYLASE in the last 168 hours. No results for input(s): AMMONIA in the last 168 hours. Cardiac Enzymes: No results for input(s): CKTOTAL, CKMB, CKMBINDEX, TROPONINI in the last 168 hours. BNP (last 3 results) No results for input(s): BNP in the last 8760 hours.  ProBNP (last 3 results) Recent Labs    03/10/24 0258  PROBNP 16,972.0*    CBG: Recent Labs  Lab 03/16/24 1121 03/16/24 1652 03/16/24 2144 03/17/24 0729 03/17/24 1139  GLUCAP 167* 185* 148* 110* 170*   Recent Results (from the past 240 hours)  Resp panel  by RT-PCR (RSV, Flu A&B, Covid) Anterior Nasal Swab     Status: None   Collection Time: 03/08/24 11:38 AM   Specimen: Anterior Nasal Swab  Result Value Ref Range Status   SARS Coronavirus 2 by RT PCR NEGATIVE NEGATIVE Final  Comment: (NOTE) SARS-CoV-2 target nucleic acids are NOT DETECTED.  The SARS-CoV-2 RNA is generally detectable in upper respiratory specimens during the acute phase of infection. The lowest concentration of SARS-CoV-2 viral copies this assay can detect is 138 copies/mL. A negative result does not preclude SARS-Cov-2 infection and should not be used as the sole basis for treatment or other patient management decisions. A negative result may occur with  improper specimen collection/handling, submission of specimen other than nasopharyngeal swab, presence of viral mutation(s) within the areas targeted by this assay, and inadequate number of viral copies(<138 copies/mL). A negative result must be combined with clinical observations, patient history, and epidemiological information. The expected result is Negative.  Fact Sheet for Patients:  BloggerCourse.com  Fact Sheet for Healthcare Providers:  SeriousBroker.it  This test is no t yet approved or cleared by the [place]  FDA and  has been authorized for detection and/or diagnosis of SARS-CoV-2 by FDA under an Emergency Use Authorization (EUA). This EUA will remain  in effect (meaning this test can be used) for the duration of the COVID-19 declaration under Section 564(b)(1) of the Act, 21 U.S.C.section 360bbb-3(b)(1), unless the authorization is terminated  or revoked sooner.       Influenza A by PCR NEGATIVE NEGATIVE Final   Influenza B by PCR NEGATIVE NEGATIVE Final    Comment: (NOTE) The Xpert Xpress SARS-CoV-2/FLU/RSV plus assay is intended as an aid in the diagnosis of influenza from Nasopharyngeal swab specimens and should not be used as a sole  basis for treatment. Nasal washings and aspirates are unacceptable for Xpert Xpress SARS-CoV-2/FLU/RSV testing.  Fact Sheet for Patients: BloggerCourse.com  Fact Sheet for Healthcare Providers: SeriousBroker.it  This test is not yet approved or cleared by the [place]  FDA and has been authorized for detection and/or diagnosis of SARS-CoV-2 by FDA under an Emergency Use Authorization (EUA). This EUA will remain in effect (meaning this test can be used) for the duration of the COVID-19 declaration under Section 564(b)(1) of the Act, 21 U.S.C. section 360bbb-3(b)(1), unless the authorization is terminated or revoked.     Resp Syncytial Virus by PCR NEGATIVE NEGATIVE Final    Comment: (NOTE) Fact Sheet for Patients: BloggerCourse.com  Fact Sheet for Healthcare Providers: SeriousBroker.it  This test is not yet approved or cleared by the [place]  FDA and has been authorized for detection and/or diagnosis of SARS-CoV-2 by FDA under an Emergency Use Authorization (EUA). This EUA will remain in effect (meaning this test can be used) for the duration of the COVID-19 declaration under Section 564(b)(1) of the Act, 21 U.S.C. section 360bbb-3(b)(1), unless the authorization is terminated or revoked.  Performed at [facility], [address] [place], [place] [address]   Respiratory (~20 pathogens) panel by PCR     Status: None   Collection Time: 03/08/24 11:38 AM   Specimen: Nasopharyngeal Swab; Respiratory  Result Value Ref Range Status   Adenovirus NOT DETECTED NOT DETECTED Final   Coronavirus 229E NOT DETECTED NOT DETECTED Final    Comment: (NOTE) The Coronavirus on the Respiratory Panel, DOES NOT test for the novel  Coronavirus (2019 nCoV)    Coronavirus HKU1 NOT DETECTED NOT DETECTED Final   Coronavirus NL63 NOT DETECTED NOT DETECTED Final    Coronavirus OC43 NOT DETECTED NOT DETECTED Final   Metapneumovirus NOT DETECTED NOT DETECTED Final   Rhinovirus / Enterovirus NOT DETECTED NOT DETECTED Final   Influenza A NOT DETECTED NOT DETECTED Final   Influenza B NOT DETECTED  NOT DETECTED Final   Parainfluenza Virus 1 NOT DETECTED NOT DETECTED Final   Parainfluenza Virus 2 NOT DETECTED NOT DETECTED Final   Parainfluenza Virus 3 NOT DETECTED NOT DETECTED Final   Parainfluenza Virus 4 NOT DETECTED NOT DETECTED Final   Respiratory Syncytial Virus NOT DETECTED NOT DETECTED Final   Bordetella pertussis NOT DETECTED NOT DETECTED Final   Bordetella Parapertussis NOT DETECTED NOT DETECTED Final   Chlamydophila pneumoniae NOT DETECTED NOT DETECTED Final   Mycoplasma pneumoniae NOT DETECTED NOT DETECTED Final    Comment: Performed at [facility], [address], [place], [place] [address]  Blood Culture (routine x 2)     Status: None   Collection Time: 03/08/24  1:34 PM   Specimen: BLOOD LEFT ARM  Result Value Ref Range Status   Specimen Description   Final    BLOOD LEFT ARM Performed at [facility], [address], [place], [place] [address]    Special Requests   Final    BOTTLES DRAWN AEROBIC ONLY Blood Culture results may not be optimal due to an inadequate volume of blood received in culture bottles Performed at [facility], [address] [place], [place] [address]    Culture   Final    NO GROWTH 5 DAYS Performed at [facility], [address] [place], [place] [address]    Report Status 03/13/2024 FINAL  Final  Blood Culture (routine x 2)     Status: None   Collection Time: 03/08/24  1:58 PM   Specimen: BLOOD LEFT ARM  Result Value Ref Range Status   Specimen Description   Final    BLOOD LEFT ARM Performed at [facility], [address], [place], [place] [address]    Special Requests   Final    BOTTLES DRAWN AEROBIC AND ANAEROBIC Blood Culture adequate volume Performed at  [facility], [address], [place], [place] [address]    Culture   Final    NO GROWTH 5 DAYS Performed at [facility], [address] [place], [place] [address]    Report Status 03/13/2024 FINAL  Final  MRSA Next Gen by PCR, Nasal     Status: None   Collection Time: 03/10/24 10:53 AM   Specimen: Nasal Mucosa; Nasal Swab  Result Value Ref Range Status   MRSA by PCR Next Gen NOT DETECTED NOT DETECTED Final    Comment: (NOTE) The GeneXpert MRSA Assay (FDA approved for NASAL specimens only), is one component of a comprehensive MRSA colonization surveillance program. It is not intended to diagnose MRSA infection nor to guide or monitor treatment for MRSA infections. Test performance is not FDA approved in patients less than 39 years old. Performed at [facility], [address], [place], [place] [address]      Studies: No results found.   Vernal Alstrom, MD  [facility] 03/17/2024  If 7PM-7AM, please contact night-coverage

## 2024-03-17 NOTE — Plan of Care (Signed)

## 2024-03-18 DIAGNOSIS — J189 Pneumonia, unspecified organism: Secondary | ICD-10-CM | POA: Diagnosis not present

## 2024-03-18 DIAGNOSIS — C9001 Multiple myeloma in remission: Secondary | ICD-10-CM | POA: Diagnosis not present

## 2024-03-18 DIAGNOSIS — D6481 Anemia due to antineoplastic chemotherapy: Secondary | ICD-10-CM | POA: Diagnosis not present

## 2024-03-18 DIAGNOSIS — D801 Nonfamilial hypogammaglobulinemia: Secondary | ICD-10-CM | POA: Diagnosis not present

## 2024-03-18 LAB — GLUCOSE, CAPILLARY
Glucose-Capillary: 124 mg/dL — ABNORMAL HIGH (ref 70–99)
Glucose-Capillary: 173 mg/dL — ABNORMAL HIGH (ref 70–99)
Glucose-Capillary: 128 mg/dL — ABNORMAL HIGH (ref 70–99)
Glucose-Capillary: 155 mg/dL — ABNORMAL HIGH (ref 70–99)

## 2024-03-18 NOTE — Progress Notes (Signed)
 PROGRESS NOTE  Jean Davidson Jean Davidson DOB: Jean Davidson DOA: 03/08/2024 PCP: Patient, [name]   LOS: 10 days   Brief narrative:  Jean Davidson is a 78 year old female with past medical history of relapsed/Refractory IgA Lambda Multiple Myeloma, GERD, HTN, macular degeneration, cervical spinal stenosis presented to hospital with shortness of breath.  Patient was recently seen as outpatient for low-grade fever and was presumed to have UTI and received ciprofloxacin .  On this presentation she presented with progressive shortness of breath nonproductive cough and low-grade fever of 101.  In the ED, patient was slightly tachycardic and needed supplemental oxygen .  Chest x-ray did not show any infiltrate.  Subsequently, CT angiogram of the chest was done which showed negative for PE but bilateral airspace disease consistent with multifocal pneumonia..  Initial labs were notable for WBC at 4.2 with 2% bands,on elranatamab .  COVID, flu, RSV negative.  Procalcitonin was negative.  Patient had elevated lactate on presentation with trended up to 5.3.  Received 3-1/2 L of IV fluids in the ED.  Continue vancomycin  cefepime  and Flagyl .  Given acute respiratory failure, patient also required BiPAP due to increased work of breathing and was admitted to the hospital for further evaluation and treatment.  During hospitalization patient had a prolonged course and was followed by oncology and received IVIG.  At this time oxygen  has been weaned to 2 L but is still on Foley catheter due to urinary retention.  Plan is to remove Foley catheter prior to discharge but patient has been reluctant on it.  Likely disposition to skilled nursing facility 03/19/2024     Assessment/Plan: Principal Problem:   Severe sepsis (HCC) Active Problems:   Acute hypoxic respiratory failure (HCC)   CAP (community acquired pneumonia)   Multiple myeloma in remission (HCC)   Essential hypertension   Anemia due to antineoplastic  chemotherapy   Diarrhea   GERD (gastroesophageal reflux disease)   Peripheral neuropathy due to chemotherapy  Severe sepsis likely secondary to multifocal community-acquired pneumonia. Patient received vancomycin , cefepime  and Flagyl  during hospitalization and currently has completed the course.  PCCM and  ID were on board. Patient had not been taking  atovaquone  at home due to taste (takes every couple days).  Currently on  nasal cannula at 2 L/min.  Appears to be stable at this time.   Acute hypoxic respiratory failure secondary to severe multifocal pneumonia and heart failure.  PE was ruled out.  Was initially on high flow nasal cannula and BiPAP, Solu-Medrol .  PCCM followed the patient during hospitalization.  Patient has completed course of antibiotic and steroids at this time.   Currently on at 2 L/min.  2D echocardiogram was performed on 03/11/2024 with LV ejection fraction of 35 to 40% with no regional wall motion abnormality and grade 1 diastolic dysfunction.  Cardiology has signed off at this time.  Recommend follow-up with cardiology as outpatient.  Recommend continuation of metoprolol  XL 25 mg daily, Lasix  80 daily.  GDMT limited by soft BP.  Acute systolic heart failure   New findings of LV ejection fraction of 35 to 40%.  Was intially on 80 mg of IV Lasix  twice daily with good diuresis.  Has been changed to lasix  80 mg orally daily. Patient is negative balance for 5776 mL so far and had total urinary output of 1100 mL in 24 hours.  Will continue to wean oxygen  as able.   On metoprolol  25 mg daily.      Relapsed multiple myeloma with acquired  hypogammaglobinemia Follows up with Dr. Federico oncology as outpatient. - 2016: Underwent autologous stem cell transplant. 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 10/16/2022: Completed CAR-T therapy with [facility]. 09/2023: Started on Elrexfio  due to progression of disease. 01/02/2024: Last visit at [facility] where induction therapy was  completed - Now on Elranatamab  (Elrexfio ); just had cycle 6 on 02/27/24 - Continue atovaquone  and acyclovir .  Oncology on board.  Status post IVIG administration.  Will need to follow-up with oncology after discharge.  Hypokalemia.  Resolved after replacement.  Latest potassium of 4.4.   Anemia due to antineoplastic chemotherapy Leukopenia/thrombocytopenia. Latest hemoglobin of 10.1.  No evidence of bleeding.   Essential hypertension Continue p.o. diuretics and metoprolol .  Latest blood pressure of 110/83   GERD (gastroesophageal reflux disease) Continue Protonix   Debility weakness.  Seen by physical therapy and recommend SNF facility placement on discharge.    Acute urinary retention.  Required In-N-Out catheter x 2 and subsequently Foley catheter.  At this time IV diuretics have been missed continued and patient has started to moving better.  I spoke with her regarding discontinuation of Foley catheter which she has been reluctant despite multiple explanations.  She is willing to try to remove the catheter in a.m. on 03/19/2024.  Will put in an order for her discontinuation tomorrow for voiding trial.  DVT prophylaxis: SCDs Start: 03/08/24 1347   Disposition: Skilled nursing facility likely 03/19/2024, patient states that she lives alone by herself and is unable to take care of herself.  Status is: Inpatient Remains inpatient appropriate because: supplemental oxygen , need for rehabitation, Foley catheter.  Code Status:     Code Status: Do not attempt resuscitation (DNR) PRE-ARREST INTERVENTIONS DESIRED  Family Communication: None at bedside.  Consultants: PCCM Cardiology Oncology  Procedures: BiPAP Foley catheter placement  Anti-infectives:  Acyclovir , atovaquone   Anti-infectives (From admission, onward)    Start     Dose/Rate Route Frequency Ordered Stop   03/10/24 1100  azithromycin  (ZITHROMAX ) 500 mg in sodium chloride  0.9 % 250 mL IVPB        500 mg 250 mL/hr over  60 Minutes Intravenous Every 24 hours 03/10/24 0928 03/12/24 1120   03/09/24 1200  vancomycin  (VANCOCIN ) IVPB 1000 mg/200 mL premix  Status:  Discontinued        1,000 mg 200 mL/hr over 60 Minutes Intravenous Every 24 hours 03/08/24 1554 03/10/24 2102   03/09/24 0000  ceFEPIme  (MAXIPIME ) 2 g in sodium chloride  0.9 % 100 mL IVPB        2 g 200 mL/hr over 30 Minutes Intravenous Every 12 hours 03/08/24 1554 03/12/24 1233   03/08/24 2200  metroNIDAZOLE  (FLAGYL ) IVPB 500 mg        500 mg 100 mL/hr over 60 Minutes Intravenous Every 12 hours 03/08/24 1523 03/12/24 2219   03/08/24 2200  acyclovir  (ZOVIRAX ) tablet 400 mg        400 mg Oral 2 times daily 03/08/24 1643     03/08/24 2200  atovaquone  (MEPRON ) 750 MG/5ML suspension 1,500 mg        1,500 mg Oral 2 times daily 03/08/24 1846     03/08/24 1115  ceFEPIme  (MAXIPIME ) 2 g in sodium chloride  0.9 % 100 mL IVPB        2 g 200 mL/hr over 30 Minutes Intravenous  Once 03/08/24 1112 03/08/24 1408   03/08/24 1115  metroNIDAZOLE  (FLAGYL ) IVPB 500 mg        500 mg 100 mL/hr over 60 Minutes Intravenous  Once 03/08/24 1112 03/08/24 1302   03/08/24 1115  vancomycin  (VANCOCIN ) IVPB 1000 mg/200 mL premix        1,000 mg 200 mL/hr over 60 Minutes Intravenous  Once 03/08/24 1112 03/08/24 1533       Subjective: Today, patient was seen and examined at bedside.  Patient states states that she feels that feels weak and fatigued.  She is still unsure about removing the Foley catheter due to concerns for incontinence.  Explained the risk of infection at length.  He wishes to try to remove it tomorrow.  Denies any chest pain, nausea or vomiting or fever or chills.   Objective: Vitals:   03/18/24 0942 03/18/24 1310  BP: (!) 142/90 110/83  Pulse: (!) 105 98  Resp:  17  Temp:  98 F (36.7 C)  SpO2:  98%    Intake/Output Summary (Last 24 hours) at 03/18/2024 1539 Last data filed at 03/18/2024 1440 Gross per 24 hour  Intake 340 ml  Output 1100 ml  Net -760  ml   Filed Weights   03/13/24 1626 03/15/24 0640 03/16/24 2000  Weight: 74.1 kg 69.7 kg 70.8 kg   Body mass index is 28.53 kg/m.   Physical Exam:  General:  Average built, not in obvious distress, on nasal cannula oxygen , elderly female, HENT:   Pallor noted.  Oral mucosa is moist.  Chest: Coarse breath sounds noted bilaterally. CVS: S1 &S2 heard. No murmur.  Regular rate and rhythm. Abdomen: Soft, nontender, nondistended.  Bowel sounds are heard.  Foley catheter in place. Extremities: No cyanosis, clubbing with trace bilateral lower extremity edema.  Peripheral pulses are palpable. Psych: Alert, awake and oriented, normal mood CNS:  No cranial nerve deficits.  Moves all extremities. Skin: Warm and dry.  No rashes noted.   Data Review: I have personally reviewed the following laboratory data and studies,  CBC: Recent Labs  Lab 03/12/24 0308 03/13/24 0312 03/15/24 0308 03/16/24 0401  WBC 5.1 4.1 3.7* 1.8*  NEUTROABS 4.3 2.7  --   --   HGB 8.9* 9.0* 10.2* 10.1*  HCT 29.7* 29.3* 32.6* 32.1*  MCV 106.1* 105.8* 103.8* 104.2*  PLT 102* 99* 101* 88*   Basic Metabolic Panel: Recent Labs  Lab 03/12/24 0308 03/13/24 0312 03/14/24 0310 03/15/24 0308 03/16/24 0401  NA 144 143 142 140 137  K 4.3 3.7 3.5 3.3* 4.4  CL 106 100 94* 91* 94*  CO2 26 32 33* 35* 32  GLUCOSE 144* 131* 133* 121* 114*  BUN 21 21 24* 28* 33*  CREATININE 0.63 0.74 0.74 0.79 0.93  CALCIUM  8.8* 8.5* 9.0 9.2 9.3  MG 2.1 1.8  --  2.2 2.5*  PHOS  --   --   --   --  3.0   Liver Function Tests: No results for input(s): AST, ALT, ALKPHOS, BILITOT, PROT, ALBUMIN in the last 168 hours.  No results for input(s): LIPASE, AMYLASE in the last 168 hours. No results for input(s): AMMONIA in the last 168 hours. Cardiac Enzymes: No results for input(s): CKTOTAL, CKMB, CKMBINDEX, TROPONINI in the last 168 hours. BNP (last 3 results) No results for input(s): BNP in the last 8760  hours.  ProBNP (last 3 results) Recent Labs    03/10/24 0258  PROBNP 16,972.0*    CBG: Recent Labs  Lab 03/17/24 1139 03/17/24 1702 03/17/24 2027 03/18/24 0807 03/18/24 1121  GLUCAP 170* 120* 216* 124* 173*   Recent Results (from the past 240 hours)  MRSA Next Gen by  PCR, Nasal     Status: None   Collection Time: 03/10/24 10:53 AM   Specimen: Nasal Mucosa; Nasal Swab  Result Value Ref Range Status   MRSA by PCR Next Gen NOT DETECTED NOT DETECTED Final    Comment: (NOTE) The GeneXpert MRSA Assay (FDA approved for NASAL specimens only), is one component of a comprehensive MRSA colonization surveillance program. It is not intended to diagnose MRSA infection nor to guide or monitor treatment for MRSA infections. Test performance is not FDA approved in patients less than 41 years old. Performed at [facility], [address] [place], [place] [address]      Studies: No results found.   Vernal Alstrom, MD  [facility] 03/18/2024  If 7PM-7AM, please contact night-coverage

## 2024-03-18 NOTE — Progress Notes (Signed)
 Occupational Therapy Treatment Patient Details Name: Jean Davidson MRN: [id number] DOB: [birth year] Today's Date: 03/18/2024   History of present illness Jean Davidson is a 78 year old female admitted to [facility] with CAP, acute systolic HF and sepsis. PMH: relapsed/Refractory IgA Lambda Multiple Myeloma, In Remission, GERD, HTN, macular degeneration, cervical spinal stenosis   OT comments  Pt making incremental progress towards OT goals. Pt able to mobilize briefly in hallway using RW with CGA before reporting fatigue. Educated re: AROM BLE exercises, energy conservation and provided UE HEP theraband handout to review. Encouraged continued incentive spirometer and flutter valve use. Pt remains interested in postacute rehab stay close to her home in [place].  SpO2 96% and above on 2 L O2      If plan is discharge home, recommend the following:  Assistance with cooking/housework;Assist for transportation;Help with stairs or ramp for entrance;A little help with bathing/dressing/bathroom   Equipment Recommendations  Other (comment) (TBD)    Recommendations for Other Services      Precautions / Restrictions Precautions Precautions: Fall Recall of Precautions/Restrictions: Intact Precaution/Restrictions Comments: monitor SpO2 Restrictions Weight Bearing Restrictions Per Provider Order: No       Mobility Bed Mobility               General bed mobility comments: in recliner on entry    Transfers Overall transfer level: Needs assistance Equipment used: Rolling walker (2 wheels) Transfers: Sit to/from Stand Sit to Stand: Supervision                 Balance Overall balance assessment: Needs assistance Sitting-balance support: No upper extremity supported, Feet supported Sitting balance-Leahy Scale: Good     Standing balance support: During functional activity, Bilateral upper extremity supported Standing balance-Leahy Scale: Fair                              ADL either performed or assessed with clinical judgement   ADL Overall ADL's : Needs assistance/impaired                                     Functional mobility during ADLs: Contact guard assist;Rolling walker (2 wheels) General ADL Comments: Pt reported desire to work on walking though fatigued quickly out in hallway. Went over AROM LE exercises, provided UE HEP theraband handout to review and energy conservation strategies with handout.    Extremity/Trunk Assessment Upper Extremity Assessment Upper Extremity Assessment: Generalized weakness;Left hand dominant   Lower Extremity Assessment Lower Extremity Assessment: Defer to PT evaluation        Vision   Vision Assessment?: No apparent visual deficits   Perception     Praxis     Communication Communication Communication: No apparent difficulties   Cognition Arousal: Alert Behavior During Therapy: WFL for tasks assessed/performed Cognition: No apparent impairments                               Following commands: Intact        Cueing   Cueing Techniques: Verbal cues  Exercises Exercises: Other exercises Other Exercises Other Exercises: ankle pumps, leg lifts, quad activation and glute squeezes    Shoulder Instructions       General Comments SpO2 96% and above on 2 L O2    Pertinent Vitals/ Pain  Pain Assessment Pain Assessment: No/denies pain  Home Living                                          Prior Functioning/Environment              Frequency  Min 2X/week        Progress Toward Goals  OT Goals(current goals can now be found in the care plan section)  Progress towards OT goals: Progressing toward goals  Acute Rehab OT Goals Patient Stated Goal: go to rehab close to [place] OT Goal Formulation: With patient Time For Goal Achievement: 03/27/24 Potential to Achieve Goals: Good ADL Goals Pt Will Perform Lower Body Bathing: with  contact guard assist;with adaptive equipment;sit to/from stand Pt Will Perform Lower Body Dressing: with contact guard assist;sit to/from stand Pt Will Transfer to Toilet: regular height toilet;ambulating;with contact guard assist;grab bars Pt Will Perform Toileting - Clothing Manipulation and hygiene: with set-up;sit to/from stand Pt/caregiver will Perform Home Exercise Program: Both right and left upper extremity;With written HEP provided;Independently;Increased strength Additional ADL Goal #1: Patient will integ 4/5 5 P's for ECT with BADL's and FM.  Plan      Co-evaluation                 AM-PAC OT 6 Clicks Daily Activity     Outcome Measure   Help from another person eating meals?: None Help from another person taking care of personal grooming?: A Little Help from another person toileting, which includes using toliet, bedpan, or urinal?: A Lot Help from another person bathing (including washing, rinsing, drying)?: A Lot Help from another person to put on and taking off regular upper body clothing?: A Little Help from another person to put on and taking off regular lower body clothing?: A Lot 6 Click Score: 16    End of Session Equipment Utilized During Treatment: Gait belt;Rolling walker (2 wheels);Oxygen   OT Visit Diagnosis: Unsteadiness on feet (R26.81);Muscle weakness (generalized) (M62.81);Other abnormalities of gait and mobility (R26.89)   Activity Tolerance Patient tolerated treatment well   Patient Left in chair;with call bell/phone within reach   Nurse Communication Mobility status        Time: 8961-8897 OT Time Calculation (min): 24 min  Charges: OT General Charges $OT Visit: 1 Visit OT Treatments $Therapeutic Activity: 8-22 mins  Mliss NOVAK, OTR/L Acute Rehab Services Office: [phone]   Mliss Fish 03/18/2024, 11:53 AM

## 2024-03-18 NOTE — Progress Notes (Signed)
 Jean Davidson   DOB:09/05/1945   FM#:[id number]      ASSESSMENT & PLAN:  Jean Davidson is a 78 year old female patient with oncologic history significant for IgA Lambda Multiple Myeloma. She was admitted on 9/13 with complaints of dyspnea and diagnosed with severe sepsis and acute hypoxic respiratory failure. Medical Oncology/Dr. Federico managing.   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 -- Status post allogenic stem sell transplant in 2017 -- Progression of disease 2024, completed CAR-T therapy with [facility] -- Initiated bispecific therapy at [facility] in 2025. - On continued maintenance weekly Elrexfio  at [facility] under Dr. Lafonda management. Received cycle 6 on 02/27/24.  Beginning cycle 7, transition to every 2 weeks.   -Discussed with patient today that we will not restart the Elrexfio  until she is stronger and back closer to her baseline performance status.   Will continue to monitor light chains and myeloma panel.  - Advised patient to continue working with PT to regain her strength.   -- Status post IVIG 400 mcg/kg dosing on 03/14/2024. -- Light chains stable.  Mult myeloma panel done 9/20, pending results -- Medical Oncology/Dr. Federico managing    Sepsis Pneumonia Acute hypoxic respiratory failure -- Improving -- On IV antibiotics -- Continue supportive care   Anemia Thrombocytopenia -- Likely secondary to malignancy, recent oncologic therapy -- Hemoglobin stable 10.1 and platelets 88K today -- No transfusional intervention required at this time -- Continue to monitor CBC with differential   Heart Failure -- Remains on O2 via nasal cannula -- Cardio followed during his hospitalization.  Recommend outpatient cardio follow-up.  Acute urinary retention - Foley catheter intact. - Primary managing    Code Status DNR-INT  Subjective:  Patient seen awake and alert sitting up in chair  at bedside.  O2 via nasal cannula intact.  No shortness of breath or distress is noted.  Denies pain or acute GI symptoms.  States she is slowly getting stronger.  Objective:   Intake/Output Summary (Last 24 hours) at 03/18/2024 1025 Last data filed at 03/18/2024 0915 Gross per 24 hour  Intake 700 ml  Output 1100 ml  Net -400 ml     PHYSICAL EXAMINATION: ECOG PERFORMANCE STATUS: 3 - Symptomatic, >50% confined to bed  Vitals:   03/18/24 0453 03/18/24 0942  BP: 121/72 (!) 142/90  Pulse: 90 (!) 105  Resp: 19   Temp: (!) 97.5 F (36.4 C)   SpO2: 100%    Filed Weights   03/13/24 1626 03/15/24 0640 03/16/24 2000  Weight: 163 lb 5.8 oz (74.1 kg) 153 lb 10.6 oz (69.7 kg) 156 lb (70.8 kg)    GENERAL: alert, no distress and comfortable SKIN: +pale skin color, texture, turgor are normal, no rashes or significant lesions EYES: normal, conjunctiva are pink and non-injected, sclera clear OROPHARYNX: no exudate, no erythema and lips, buccal mucosa, and tongue normal  NECK: supple, thyroid  normal size, non-tender, without nodularity LYMPH: no palpable lymphadenopathy in the cervical, axillary or inguinal LUNGS: clear to auscultation and percussion with normal breathing effort HEART: regular rate & rhythm and no murmurs and no lower extremity edema ABDOMEN: abdomen soft, non-tender and normal bowel sounds  +foley MUSCULOSKELETAL: no cyanosis of digits and no clubbing  PSYCH: alert & oriented x 3 with fluent speech NEURO: no focal motor/sensory deficits   All questions were answered. The patient knows to call the clinic with any problems, questions or concerns.   The total  time spent in the appointment was 40 minutes encounter with patient including review of chart and various tests results, discussions about plan of care and coordination of care plan  Olam JINNY Brunner, NP 03/18/2024 10:25 AM    Labs Reviewed:  Lab Results  Component Value Date   WBC 1.8 (L) 03/16/2024   HGB 10.1  (L) 03/16/2024   HCT 32.1 (L) 03/16/2024   MCV 104.2 (H) 03/16/2024   PLT 88 (L) 03/16/2024   Recent Labs    02/27/24 0841 03/05/24 0757 03/08/24 1138 03/08/24 1216 03/14/24 0310 03/15/24 0308 03/16/24 0401  NA 141 141 141   < > 142 140 137  K 3.9 3.5 3.6   < > 3.5 3.3* 4.4  CL 106 106 104   < > 94* 91* 94*  CO2 29 27 22    < > 33* 35* 32  GLUCOSE 102* 111* 95   < > 133* 121* 114*  BUN 18 15 11    < > 24* 28* 33*  CREATININE 0.70 0.71 0.78   < > 0.74 0.79 0.93  CALCIUM  8.7* 8.8* 9.4   < > 9.0 9.2 9.3  GFRNONAA >60 >60 >60   < > >60 >60 >60  PROT 6.4* 6.4* 6.4*  --   --   --   --   ALBUMIN 4.0 4.2 4.1  --   --   --   --   AST 17 16 41  --   --   --   --   ALT 13 9 12   --   --   --   --   ALKPHOS 97 96 99  --   --   --   --   BILITOT 0.4 0.4 0.3  --   --   --   --    < > = values in this interval not displayed.    Studies Reviewed:  ECHOCARDIOGRAM COMPLETE Result Date: 03/11/2024    ECHOCARDIOGRAM REPORT   Patient Name:   Jean Davidson Date of Exam: 03/11/2024 Medical Rec #:  [id number]      Height:       62.0 in Accession #:    [id number]     Weight:       160.0 lb Date of Birth:  [birth year]      BSA:          1.739 m Patient Age:    77 years       BP:           94/54 mmHg Patient Gender: F              HR:           90 bpm. Exam Location:  Inpatient Procedure: 2D Echo, Intracardiac Opacification Agent, Cardiac Doppler and Color            Doppler (Both Spectral and Color Flow Doppler were utilized during            procedure). Indications:    CHF-Acute Diastolic I50.31  History:        Patient has prior history of Echocardiogram examinations, most                 recent 04/20/2021. Risk Factors:Hypertension and Former Smoker.  Sonographer:    Koleen Popper RDCS Referring Phys: [id number] PAULA SOUTHERLY IMPRESSIONS  1. Left ventricular ejection fraction, by estimation, is 35 to 40%. The left ventricle has moderately decreased function. The left ventricle has no regional wall  motion  abnormalities. Left ventricular diastolic parameters are consistent with Grade I diastolic dysfunction (impaired relaxation).  2. Right ventricular systolic function is normal. The right ventricular size is normal. There is mildly elevated pulmonary artery systolic pressure.  3. The mitral valve is normal in structure. Moderate mitral valve regurgitation. No evidence of mitral stenosis.  4. The aortic valve is normal in structure. Aortic valve regurgitation is not visualized. No aortic stenosis is present.  5. The inferior vena cava is normal in size with greater than 50% respiratory variability, suggesting right atrial pressure of 3 mmHg. FINDINGS  Left Ventricle: Left ventricular ejection fraction, by estimation, is 35 to 40%. The left ventricle has moderately decreased function. The left ventricle has no regional wall motion abnormalities. Definity  contrast agent was given IV to delineate the left ventricular endocardial borders. The left ventricular internal cavity size was normal in size. There is no left ventricular hypertrophy. Left ventricular diastolic parameters are consistent with Grade I diastolic dysfunction (impaired relaxation).  LV Wall Scoring: The apex is akinetic. The apical lateral segment, mid inferoseptal segment, apical septal segment, apical anterior segment, and apical inferior segment are hypokinetic. Right Ventricle: The right ventricular size is normal. No increase in right ventricular wall thickness. Right ventricular systolic function is normal. There is mildly elevated pulmonary artery systolic pressure. The tricuspid regurgitant velocity is 3.03  m/s, and with an assumed right atrial pressure of 8 mmHg, the estimated right ventricular systolic pressure is 44.7 mmHg. Left Atrium: Left atrial size was normal in size. Right Atrium: Right atrial size was normal in size. Pericardium: There is no evidence of pericardial effusion. Mitral Valve: The mitral valve is normal in structure. Mild  mitral annular calcification. Moderate mitral valve regurgitation. No evidence of mitral valve stenosis. MV peak gradient, 6.2 mmHg. The mean mitral valve gradient is 3.0 mmHg. Tricuspid Valve: The tricuspid valve is normal in structure. Tricuspid valve regurgitation is mild . No evidence of tricuspid stenosis. Aortic Valve: The aortic valve is normal in structure. Aortic valve regurgitation is not visualized. No aortic stenosis is present. Pulmonic Valve: The pulmonic valve was normal in structure. Pulmonic valve regurgitation is trivial. No evidence of pulmonic stenosis. Aorta: The aortic root is normal in size and structure. Venous: The inferior vena cava is normal in size with greater than 50% respiratory variability, suggesting right atrial pressure of 3 mmHg. IAS/Shunts: No atrial level shunt detected by color flow Doppler.  LEFT VENTRICLE PLAX 2D LVIDd:         4.20 cm   Diastology LVIDs:         3.10 cm   LV e' medial:    6.53 cm/s LV PW:         0.80 cm   LV E/e' medial:  15.2 LV IVS:        1.10 cm   LV e' lateral:   6.74 cm/s LVOT diam:     1.80 cm   LV E/e' lateral: 14.8 LV SV:         45 LV SV Index:   26 LVOT Area:     2.54 cm  RIGHT VENTRICLE             IVC RV S prime:     15.20 cm/s  IVC diam: 2.40 cm TAPSE (M-mode): 2.4 cm LEFT ATRIUM           Index        RIGHT ATRIUM  Index LA diam:      4.00 cm 2.30 cm/m   RA Area:     10.90 cm LA Vol (A2C): 36.0 ml 20.70 ml/m  RA Volume:   24.60 ml  14.15 ml/m LA Vol (A4C): 52.6 ml 30.25 ml/m  AORTIC VALVE LVOT Vmax:   102.00 cm/s LVOT Vmean:  70.600 cm/s LVOT VTI:    0.176 m  AORTA Ao Root diam: 2.80 cm Ao Asc diam:  3.70 cm MITRAL VALVE                  TRICUSPID VALVE MV Area (PHT): 7.16 cm       TR Peak grad:   36.7 mmHg MV Area VTI:   1.89 cm       TR Vmax:        303.00 cm/s MV Peak grad:  6.2 mmHg MV Mean grad:  3.0 mmHg       SHUNTS MV Vmax:       1.25 m/s       Systemic VTI:  0.18 m MV Vmean:      81.2 cm/s      Systemic Diam: 1.80 cm  MV Decel Time: 106 msec MR Peak grad:    108.8 mmHg MR Mean grad:    79.0 mmHg MR Vmax:         521.50 cm/s MR Vmean:        427.0 cm/s MR PISA:         1.01 cm MR PISA Eff ROA: 7 mm MR PISA Radius:  0.40 cm MV E velocity: 99.50 cm/s MV A velocity: 78.80 cm/s MV E/A ratio:  1.26 Aditya Sabharwal Electronically signed by Ria Commander Signature Date/Time: 03/11/2024/5:59:16 PM    Final    CT Angio Chest Pulmonary Embolism (PE) W or WO Contrast Result Date: 03/09/2024 EXAM: CTA of the Chest with contrast for PE 03/09/2024 12:17:09 AM TECHNIQUE: CTA of the chest was performed after the administration of intravenous contrast. Multiplanar reformatted images are provided for review. MIP images are provided for review. Automated exposure control, iterative reconstruction, and/or weight based adjustment of the mA/kV was utilized to reduce the radiation dose to as low as reasonably achievable. COMPARISON: None available. CLINICAL HISTORY: Pulmonary embolism (PE) suspected, high prob. FINDINGS: PULMONARY ARTERIES: Pulmonary arteries are adequately opacified for evaluation. No pulmonary embolism. Main pulmonary artery is normal in caliber. MEDIASTINUM: The heart and pericardium demonstrate no acute abnormality. No pericardial effusion. Mild atherosclerotic calcification within the thoracic aorta. No aortic aneurysm. LYMPH NODES: No mediastinal, hilar or axillary lymphadenopathy. LUNGS AND PLEURA: Extensive bilateral airspace infiltrate with bilateral lower lobe dependent consolidation in keeping with multifocal infection or aspiration. Small bilateral pleural effusions. No pneumothorax. No central obstructing lesion. UPPER ABDOMEN: Moderate hiatal hernia. Cholelithiasis without superimposed pericholecystic inflammatory change. No intra or extrahepatic biliary ductal dilation. SOFT TISSUES AND BONES: Sclerotic margined lytic lesion within the T3 vertebral body, possibly an intraosseous hemangioma, with superimposed  pathologic compression deformity noted. This is likely chronic in nature. Chronic mild L5 compression deformity noted. No retropulsion. Osseous structures are otherwise age appropriate. Right reverse shoulder arthroplasty noted. No acute bone abnormality. IMPRESSION: 1. No pulmonary embolism. 2. Extensive bilateral airspace infiltrate with bilateral lower lobe dependent consolidation, consistent with multifocal infection or aspiration. 3. Small bilateral pleural effusions. 4. Moderate hiatal hernia. Electronically signed by: Dorethia Molt MD 03/09/2024 12:24 AM EDT RP Workstation: [id number]   DG Chest Port 1 View Result Date: 03/08/2024 EXAM: 1 VIEW XRAY OF  THE CHEST 03/08/2024 11:37:42 AM COMPARISON: 03/05/2024 CLINICAL HISTORY: Questionable sepsis - evaluate for abnormality. Per chart: tachycardia. She is currently receiving chemo. Was seen last week for fevers at outpatient office. 89% on RA during triage. FINDINGS: LUNGS AND PLEURA: Calcified right lower lobe granuloma. No focal pulmonary opacity. No pulmonary edema. No pleural effusion. No pneumothorax. HEART AND MEDIASTINUM: Aortic calcification. No acute abnormality of the cardiac and mediastinal silhouettes. BONES AND SOFT TISSUES: Right shoulder arthroplasty noted. Old healed right rib fractures. Degenerative changes of spine. No acute osseous abnormality. IMPRESSION: 1. No acute findings. Electronically signed by: Waddell Calk MD 03/08/2024 12:10 PM EDT RP Workstation: [id number]   DG Chest 2 View Result Date: 03/05/2024 CLINICAL DATA:  Fever, shortness of breath EXAM: CHEST - 2 VIEW COMPARISON:  April 05, 2021 chest radiograph FINDINGS: Cyst unchanged chronic calcified pulmonary granuloma in the right lower lung. No focal consolidations. No pleural effusions. No pneumothorax. Unchanged cardiomediastinal silhouette. No acute osseous findings. Partially imaged right shoulder arthroplasty hardware. IMPRESSION: No acute findings. Electronically  Signed   By: Michaeline Blanch M.D.   On: 03/05/2024 12:07

## 2024-03-18 NOTE — TOC Progression Note (Addendum)
 Transition of Care [facility]) - Progression Note    Patient Details  Name: Jean Davidson MRN: [id number] Date of Birth: 20-Sep-1945  [facility] of [facility]) CM/SW Contact  Tawni CHRISTELLA Eva, LCSW Phone Number: 03/18/2024, 11:45 AM  Clinical Narrative:     Pt has chosen [facility] for SNF placement. CSW will start insurance auth when pt is close to medically stability.Care management to follow.   ADDEN 1:30pm Pt's insurance shara was approved for [facility]. Csw spoke with Tammy admission to inquire about transportation to pt's Cancer treatment. Tammy reports the facility is willing to help with transport. Care management to follow.   Barriers to Discharge: Continued Medical Work up               Expected Discharge Plan and Services In-house Referral: NA Discharge Planning Services: CM Consult Post Acute Care Choice: NA Living arrangements for the past 2 months: Single Family Home                 DME Arranged: N/A DME Agency: NA       HH Arranged: NA HH Agency: NA         Social Drivers of Health (SDOH) Interventions SDOH Screenings   Food Insecurity: No Food Insecurity (03/09/2024)  Housing: Low Risk  (03/09/2024)  Transportation Needs: No Transportation Needs (03/09/2024)  Utilities: Not At Risk (03/09/2024)  Depression (PHQ2-9): Low Risk  (03/05/2024)  Recent Concern: Depression (PHQ2-9) - Medium Risk (02/21/2024)  Financial Resource Strain: Low Risk  (10/09/2023)   Received from [facility]  Social Connections: Moderately Integrated (03/09/2024)  Tobacco Use: Medium Risk (03/09/2024)    Readmission Risk Interventions     No data to display

## 2024-03-19 ENCOUNTER — Inpatient Hospital Stay

## 2024-03-19 DIAGNOSIS — A419 Sepsis, unspecified organism: Secondary | ICD-10-CM | POA: Diagnosis not present

## 2024-03-19 DIAGNOSIS — D801 Nonfamilial hypogammaglobulinemia: Secondary | ICD-10-CM | POA: Diagnosis not present

## 2024-03-19 DIAGNOSIS — R000 Tachycardia, unspecified: Secondary | ICD-10-CM | POA: Diagnosis not present

## 2024-03-19 DIAGNOSIS — J9601 Acute respiratory failure with hypoxia: Secondary | ICD-10-CM | POA: Diagnosis not present

## 2024-03-19 LAB — CBC WITH DIFFERENTIAL/PLATELET
Abs Immature Granulocytes: 0.03 K/uL (ref 0.00–0.07)
Basophils Absolute: 0 K/uL (ref 0.0–0.1)
Basophils Relative: 1 %
Eosinophils Absolute: 0 K/uL (ref 0.0–0.5)
Eosinophils Relative: 0 %
HCT: 36 % (ref 36.0–46.0)
Hemoglobin: 11 g/dL — ABNORMAL LOW (ref 12.0–15.0)
Immature Granulocytes: 1 %
Lymphocytes Relative: 49 %
Lymphs Abs: 1.4 K/uL (ref 0.7–4.0)
MCH: 32 pg (ref 26.0–34.0)
MCHC: 30.6 g/dL (ref 30.0–36.0)
MCV: 104.7 fL — ABNORMAL HIGH (ref 80.0–100.0)
Monocytes Absolute: 0.7 K/uL (ref 0.1–1.0)
Monocytes Relative: 26 %
Neutro Abs: 0.7 K/uL — ABNORMAL LOW (ref 1.7–7.7)
Neutrophils Relative %: 23 %
Platelets: 93 K/uL — ABNORMAL LOW (ref 150–400)
RBC: 3.44 MIL/uL — ABNORMAL LOW (ref 3.87–5.11)
RDW: 14 % (ref 11.5–15.5)
Smear Review: NORMAL
WBC: 2.8 K/uL — ABNORMAL LOW (ref 4.0–10.5)
nRBC: 0 % (ref 0.0–0.2)

## 2024-03-19 LAB — BASIC METABOLIC PANEL WITH GFR
Anion gap: 14 (ref 5–15)
BUN: 40 mg/dL — ABNORMAL HIGH (ref 8–23)
CO2: 28 mmol/L (ref 22–32)
Calcium: 9.6 mg/dL (ref 8.9–10.3)
Chloride: 95 mmol/L — ABNORMAL LOW (ref 98–111)
Creatinine, Ser: 0.95 mg/dL (ref 0.44–1.00)
GFR, Estimated: >60 mL/min (ref >60)
Glucose, Bld: 99 mg/dL (ref 70–99)
Potassium: 4.7 mmol/L (ref 3.5–5.1)
Sodium: 136 mmol/L (ref 135–145)

## 2024-03-19 LAB — MULTIPLE MYELOMA PANEL, SERUM
Albumin SerPl Elph-Mcnc: 3.1 g/dL (ref 2.9–4.4)
Albumin/Glob SerPl: 1.5 (ref 0.7–1.7)
Alpha 1: 0.3 g/dL (ref 0.0–0.4)
Alpha2 Glob SerPl Elph-Mcnc: 0.9 g/dL (ref 0.4–1.0)
B-Globulin SerPl Elph-Mcnc: 0.7 g/dL (ref 0.7–1.3)
Gamma Glob SerPl Elph-Mcnc: 0.1 g/dL — ABNORMAL LOW (ref 0.4–1.8)
Globulin, Total: 2.1 g/dL — ABNORMAL LOW (ref 2.2–3.9)
IgA: <5 mg/dL — ABNORMAL LOW (ref 64–422)
IgG (Immunoglobin G), Serum: 210 mg/dL — ABNORMAL LOW (ref 586–1602)
IgM (Immunoglobulin M), Srm: <5 mg/dL — ABNORMAL LOW (ref 26–217)
Total Protein ELP: 5.2 g/dL — ABNORMAL LOW (ref 6.0–8.5)

## 2024-03-19 LAB — GLUCOSE, CAPILLARY
Glucose-Capillary: 112 mg/dL — ABNORMAL HIGH (ref 70–99)
Glucose-Capillary: 152 mg/dL — ABNORMAL HIGH (ref 70–99)
Glucose-Capillary: 120 mg/dL — ABNORMAL HIGH (ref 70–99)
Glucose-Capillary: 174 mg/dL — ABNORMAL HIGH (ref 70–99)

## 2024-03-19 LAB — MAGNESIUM: Magnesium: 2.8 mg/dL — ABNORMAL HIGH (ref 1.7–2.4)

## 2024-03-19 MED ORDER — TAMSULOSIN HCL 0.4 MG PO CAPS
0.4000 mg | ORAL_CAPSULE | Freq: Every day | ORAL | Status: DC
Start: 2024-03-19 — End: 2024-03-26
  Administered 2024-03-19 – 2024-03-20 (×2): 0.4 mg via ORAL
  Filled 2024-03-19 (×2): qty 1

## 2024-03-19 NOTE — Progress Notes (Signed)
 Physical Therapy Treatment Patient Details Name: Jean Davidson MRN: [id number] DOB: 06-10-1946 Today's Date: 03/19/2024   History of Present Illness Jean Davidson is a 78 year old female admitted to [facility] with CAP, acute systolic HF and sepsis. PMH: relapsed/Refractory IgA Lambda Multiple Myeloma, In Remission, GERD, HTN, macular degeneration, cervical spinal stenosis    PT Comments  Pt is progressing with mobility, she tolerated increased ambulation distance of 90' + 35' with seated rest break 2* dyspnea/fatigue. SpO2 100% on 2L O2 walking.    If plan is discharge home, recommend the following: A little help with walking and/or transfers;A little help with bathing/dressing/bathroom;Assistance with cooking/housework;Assist for transportation;Help with stairs or ramp for entrance   Can travel by private vehicle     Yes  Equipment Recommendations  None recommended by PT    Recommendations for Other Services       Precautions / Restrictions Precautions Precautions: Fall Recall of Precautions/Restrictions: Intact Precaution/Restrictions Comments: monitor SpO2 Restrictions Weight Bearing Restrictions Per Provider Order: No     Mobility  Bed Mobility               General bed mobility comments: up in recliner    Transfers Overall transfer level: Needs assistance Equipment used: Rolling walker (2 wheels) Transfers: Sit to/from Stand Sit to Stand: Supervision           General transfer comment: VCs hand placement    Ambulation/Gait Ambulation/Gait assistance: Contact guard assist Gait Distance (Feet): 90 Feet Assistive device: Rolling walker (2 wheels) Gait Pattern/deviations: Step-through pattern, Decreased stride length, Trunk flexed Gait velocity: decr     General Gait Details: VCs for posture, SpO2 100% on 2L O2 walking, 2/4 dyspnea limited distance tolerance. 2' + 35' wtih seated rest 2* fatigue. VCs for PLB.   Stairs             Wheelchair  Mobility     Tilt Bed    Modified Rankin (Stroke Patients Only)       Balance Overall balance assessment: Needs assistance Sitting-balance support: No upper extremity supported, Feet supported Sitting balance-Leahy Scale: Good     Standing balance support: During functional activity, Bilateral upper extremity supported Standing balance-Leahy Scale: Fair                              Hotel manager: No apparent difficulties  Cognition Arousal: Alert Behavior During Therapy: WFL for tasks assessed/performed                             Following commands: Intact      Cueing Cueing Techniques: Verbal cues  Exercises      General Comments        Pertinent Vitals/Pain Pain Assessment Pain Assessment: No/denies pain    Home Living                          Prior Function            PT Goals (current goals can now be found in the care plan section) Acute Rehab PT Goals Patient Stated Goal: to be able to walk farther, 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 PT Goal Formulation: With patient Time For Goal Achievement: 03/27/24 Potential to Achieve Goals: Good Progress towards PT goals: Progressing toward goals    Frequency    Min 2X/week  PT Plan      Co-evaluation              AM-PAC PT 6 Clicks Mobility   Outcome Measure  Help needed turning from your back to your side while in a flat bed without using bedrails?: None Help needed moving from lying on your back to sitting on the side of a flat bed without using bedrails?: A Little Help needed moving to and from a bed to a chair (including a wheelchair)?: A Little Help needed standing up from a chair using your arms (e.g., wheelchair or bedside chair)?: A Little Help needed to walk in hospital room?: A Little Help needed climbing 3-5 steps with a railing? : A Lot 6 Click Score: 18    End of Session Equipment Utilized During  Treatment: Gait belt;Oxygen  Activity Tolerance: Patient limited by fatigue Patient left: with call bell/phone within reach;Other (comment) (on bedside commode)   PT Visit Diagnosis: Unsteadiness on feet (R26.81);Muscle weakness (generalized) (M62.81);Difficulty in walking, not elsewhere classified (R26.2)     Time: 8874-8857 PT Time Calculation (min) (ACUTE ONLY): 17 min  Charges:    $Gait Training: 8-22 mins PT General Charges $$ ACUTE PT VISIT: 1 Visit                     Jean Davidson PT 03/19/2024  Acute Rehabilitation Services  Office [phone]

## 2024-03-19 NOTE — Plan of Care (Signed)
  Problem: Education: Goal: Knowledge of General Education information will improve Description: Including pain rating scale, medication(s)/side effects and non-pharmacologic comfort measures Outcome: Progressing   Problem: Clinical Measurements: Goal: Diagnostic test results will improve Outcome: Progressing   Problem: Activity: Goal: Risk for activity intolerance will decrease Outcome: Progressing   Problem: Nutrition: Goal: Adequate nutrition will be maintained Outcome: Progressing   Problem: Pain Managment: Goal: General experience of comfort will improve and/or be controlled Outcome: Progressing   Problem: Safety: Goal: Ability to remain free from injury will improve Outcome: Progressing

## 2024-03-19 NOTE — Progress Notes (Signed)
 [facility]                                                                              Jean Davidson, is a 78 y.o. female, DOB - [birth year], FMW:[id number] Admit date - 03/08/2024    Outpatient Primary MD for the patient is Patient, [name]  LOS - 11  days  Chief Complaint  Patient presents with   Tachycardia    Pt Bib ems for tachycardia. She is currently receiving chemo. Was seen last week for fevers at outpatient office. 89% on RA during triage.        Brief summary   78 year old female with past medical history of relapsed/Refractory IgA Lambda Multiple Myeloma, GERD, HTN, macular degeneration, cervical spinal stenosis presented to hospital with shortness of breath.  Patient was recently seen as outpatient for low-grade fever and was presumed to have UTI and received ciprofloxacin .  On this presentation she presented with progressive shortness of breath nonproductive cough and low-grade fever of 101.  In the ED, patient was slightly tachycardic and needed supplemental oxygen .  Chest x-ray did not show any infiltrate.  Subsequently, CT angiogram of the chest was done which showed negative for PE but bilateral airspace disease consistent with multifocal pneumonia..  Initial labs were notable for WBC at 4.2 with 2% bands,on elranatamab .  COVID, flu, RSV negative.  Procalcitonin was negative.  Patient had elevated lactate on presentation with trended up to 5.3.  Received 3-1/2 L of IV fluids in the ED.  Continue vancomycin  cefepime  and Flagyl .  Given acute respiratory failure, patient also required BiPAP due to increased work of breathing and was admitted to the hospital for further evaluation and treatment.  During hospitalization patient had a prolonged course and was followed by oncology and received IVIG.  At this time oxygen  has been weaned to 2 L but is still on Foley catheter due to urinary retention.  Foley catheter removed 9/24, awaiting SNF and  voiding   Assessment & Plan      Severe sepsis likely secondary to multifocal community-acquired pneumonia. - Completed course of IV vancomycin , cefepime  and Flagyl  - Seen by PCCM, ID - had not been taking  atovaquone  at home due to taste (takes every couple days).   - Currently on  nasal cannula at 2 L/min.  Appears to be stable at this time.   Acute hypoxic respiratory failure secondary to severe multifocal pneumonia and heart failure.  -  PE was ruled out. Was initially on high flow nasal cannula and BiPAP, Solu-Medrol .  PCCM followed the patient during hospitalization.  Patient has completed course of antibiotic and steroids at this time.   -  Currently on at 2 L/min, wean as tolerated.  - 2D echo on 9/16 showed EF of 35 to 40% with no regional W MA, G1 DD.  - Cardiology has signed off, recommended metoprolol  XL 25 mg daily, Lasix  80 mg daily.  GDMT limited by soft BP.   Acute systolic heart failure    - New findings of LV ejection fraction of 35 to 40%.  -  Was intially on 80 mg of IV Lasix   twice daily with good diuresis.   - Negative balance of 6.1 L, weight down from 167.7lbs  on admission to 156 - Wean O2 as tolerated, continue metoprolol , Lasix  80 mg daily     Relapsed multiple myeloma with acquired hypogammaglobinemia - Follows with Dr. Federico oncology as outpatient.  - 2016: Underwent autologous stem cell transplant. 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 10/16/2022: Completed CAR-T therapy with [facility]. 09/2023: Started on Elrexfio  due to progression of disease. 01/02/2024: Last visit at [facility] where induction therapy was completed - Now on Elranatamab  (Elrexfio ); just had cycle 6 on 02/27/24 - Continue atovaquone  and acyclovir .  Oncology on board.  Status post IVIG administration.  Will need to follow-up with oncology after discharge.   Hypokalemia.   - Replace as needed   Anemia due to antineoplastic chemotherapy Leukopenia/thrombocytopenia. -H&H  stable   Essential hypertension Continue p.o. diuretics and metoprolol .     GERD (gastroesophageal reflux disease) Continue Protonix    Debility weakness.  - PT recommended SNF   Acute urinary retention.  - Foley catheter removed 9/24, awaiting voiding trial.  Estimated body mass index is 28.59 kg/m as calculated from the following:   Height as of this encounter: 5' 2 (1.575 m).   Weight as of this encounter: 70.9 kg.  Code Status: DNR DVT Prophylaxis:  SCDs Start: 03/08/24 1347   Level of Care: Level of care: Telemetry Family Communication: Updated patient Disposition Plan:      Remains inpatient appropriate:      Procedures:    Consultants:     Antimicrobials:   Anti-infectives (From admission, onward)    Start     Dose/Rate Route Frequency Ordered Stop   03/10/24 1100  azithromycin  (ZITHROMAX ) 500 mg in sodium chloride  0.9 % 250 mL IVPB        500 mg 250 mL/hr over 60 Minutes Intravenous Every 24 hours 03/10/24 0928 03/12/24 1120   03/09/24 1200  vancomycin  (VANCOCIN ) IVPB 1000 mg/200 mL premix  Status:  Discontinued        1,000 mg 200 mL/hr over 60 Minutes Intravenous Every 24 hours 03/08/24 1554 03/10/24 2102   03/09/24 0000  ceFEPIme  (MAXIPIME ) 2 g in sodium chloride  0.9 % 100 mL IVPB        2 g 200 mL/hr over 30 Minutes Intravenous Every 12 hours 03/08/24 1554 03/12/24 1233   03/08/24 2200  metroNIDAZOLE  (FLAGYL ) IVPB 500 mg        500 mg 100 mL/hr over 60 Minutes Intravenous Every 12 hours 03/08/24 1523 03/12/24 2219   03/08/24 2200  acyclovir  (ZOVIRAX ) tablet 400 mg        400 mg Oral 2 times daily 03/08/24 1643     03/08/24 2200  atovaquone  (MEPRON ) 750 MG/5ML suspension 1,500 mg        1,500 mg Oral 2 times daily 03/08/24 1846     03/08/24 1115  ceFEPIme  (MAXIPIME ) 2 g in sodium chloride  0.9 % 100 mL IVPB        2 g 200 mL/hr over 30 Minutes Intravenous  Once 03/08/24 1112 03/08/24 1408   03/08/24 1115  metroNIDAZOLE  (FLAGYL ) IVPB 500 mg         500 mg 100 mL/hr over 60 Minutes Intravenous  Once 03/08/24 1112 03/08/24 1302   03/08/24 1115  vancomycin  (VANCOCIN ) IVPB 1000 mg/200 mL premix        1,000 mg 200 mL/hr over 60 Minutes Intravenous  Once 03/08/24 1112 03/08/24 1533  Medications  acyclovir   400 mg Oral BID   aspirin  EC  81 mg Oral Daily   atovaquone   1,500 mg Oral BID   Chlorhexidine  Gluconate Cloth  6 each Topical Daily   cholecalciferol   4,000 Units Oral Daily   furosemide   80 mg Oral Daily   insulin  aspart  0-6 Units Subcutaneous TID AC & HS   metoprolol  succinate  25 mg Oral Daily   mirtazapine   15 mg Oral QHS   multivitamin  2 tablet Oral BID   pantoprazole   40 mg Oral Daily   saccharomyces boulardii  250 mg Oral BID      Subjective:   Jean Davidson was seen and examined today.  Sitting up in the chair, on 2 L O2 via [place], Foley catheter removed this morning, awaiting voiding trial.    Patient denies dizziness, chest pain, shortness of breath, abdominal pain, N/V/D/C, new weakness, numbess, tingling. No acute events overnight.    Objective:   Vitals:   03/18/24 1310 03/18/24 2121 03/18/24 2157 03/19/24 0420  BP: 110/83  130/81 132/77  Pulse: 98  85 93  Resp: 17  18 18   Temp: 98 F (36.7 C)  98.4 F (36.9 C) 98.3 F (36.8 C)  TempSrc: Oral  Oral Oral  SpO2: 98%  97% 99%  Weight:  70.9 kg    Height:        Intake/Output Summary (Last 24 hours) at 03/19/2024 1448 Last data filed at 03/19/2024 0600 Gross per 24 hour  Intake 50 ml  Output 395 ml  Net -345 ml     Wt Readings from Last 3 Encounters:  03/18/24 70.9 kg  03/05/24 72.1 kg  02/27/24 72.8 kg     Exam General: Alert and oriented x 3, NAD, 2 L O2 via [place] Cardiovascular: S1 S2 auscultated,  RRR Respiratory: Clear to auscultation bilaterally, no wheezing Gastrointestinal: Soft, nontender, nondistended, + bowel sounds Ext: no pedal edema bilaterally Neuro: No new deficits Psych: Normal affect     Data Reviewed:  I  have personally reviewed following labs    CBC Lab Results  Component Value Date   WBC 2.8 (L) 03/19/2024   RBC 3.44 (L) 03/19/2024   HGB 11.0 (L) 03/19/2024   HCT 36.0 03/19/2024   MCV 104.7 (H) 03/19/2024   MCH 32.0 03/19/2024   PLT 93 (L) 03/19/2024   MCHC 30.6 03/19/2024   RDW 14.0 03/19/2024   LYMPHSABS 1.4 03/19/2024   MONOABS 0.7 03/19/2024   EOSABS 0.0 03/19/2024   BASOSABS 0.0 03/19/2024     Last metabolic panel Lab Results  Component Value Date   NA 136 03/19/2024   K 4.7 03/19/2024   CL 95 (L) 03/19/2024   CO2 28 03/19/2024   BUN 40 (H) 03/19/2024   CREATININE 0.95 03/19/2024   GLUCOSE 99 03/19/2024   GFRNONAA >60 03/19/2024   GFRAA >60 03/25/2020   CALCIUM  9.6 03/19/2024   PHOS 3.0 03/16/2024   PROT 6.4 (L) 03/08/2024   ALBUMIN 4.1 03/08/2024   LABGLOB 2.1 (L) 03/15/2024   BILITOT 0.3 03/08/2024   ALKPHOS 99 03/08/2024   AST 41 03/08/2024   ALT 12 03/08/2024   ANIONGAP 14 03/19/2024    CBG (last 3)  Recent Labs    03/18/24 2154 03/19/24 0735 03/19/24 1152  GLUCAP 155* 112* 152*      Coagulation Profile: No results for input(s): INR, PROTIME in the last 168 hours.   Radiology Studies: I have personally reviewed the imaging studies  No results found.     Nydia Distance M.D. [facility] 03/19/2024, 2:48 PM  Available via Epic secure chat 7am-7pm After 7 pm, please refer to night coverage provider listed on amion.

## 2024-03-20 DIAGNOSIS — D6481 Anemia due to antineoplastic chemotherapy: Secondary | ICD-10-CM

## 2024-03-20 DIAGNOSIS — D801 Nonfamilial hypogammaglobulinemia: Secondary | ICD-10-CM | POA: Diagnosis not present

## 2024-03-20 DIAGNOSIS — T451X5A Adverse effect of antineoplastic and immunosuppressive drugs, initial encounter: Secondary | ICD-10-CM

## 2024-03-20 DIAGNOSIS — R000 Tachycardia, unspecified: Secondary | ICD-10-CM | POA: Diagnosis not present

## 2024-03-20 DIAGNOSIS — J9601 Acute respiratory failure with hypoxia: Secondary | ICD-10-CM | POA: Diagnosis not present

## 2024-03-20 DIAGNOSIS — A419 Sepsis, unspecified organism: Secondary | ICD-10-CM | POA: Diagnosis not present

## 2024-03-20 LAB — GLUCOSE, CAPILLARY
Glucose-Capillary: 119 mg/dL — ABNORMAL HIGH (ref 70–99)
Glucose-Capillary: 143 mg/dL — ABNORMAL HIGH (ref 70–99)
Glucose-Capillary: 143 mg/dL — ABNORMAL HIGH (ref 70–99)
Glucose-Capillary: 139 mg/dL — ABNORMAL HIGH (ref 70–99)

## 2024-03-20 MED ORDER — GUAIFENESIN-DM 100-10 MG/5ML PO SYRP: 5.0000 mL | ORAL_SOLUTION | ORAL | 0 refills | Status: DC | PRN

## 2024-03-20 MED ORDER — MELATONIN 5 MG PO TABS: 5.0000 mg | ORAL_TABLET | Freq: Every evening | ORAL | Status: DC | PRN

## 2024-03-20 MED ORDER — TAMSULOSIN HCL 0.4 MG PO CAPS: 0.4000 mg | ORAL_CAPSULE | Freq: Every day | ORAL | Status: AC

## 2024-03-20 MED ORDER — SACCHAROMYCES BOULARDII 250 MG PO CAPS: 250.0000 mg | ORAL_CAPSULE | Freq: Two times a day (BID) | ORAL | Status: DC

## 2024-03-20 MED ORDER — METOPROLOL SUCCINATE ER 25 MG PO TB24: 25.0000 mg | ORAL_TABLET | Freq: Every day | ORAL | Status: DC

## 2024-03-20 MED ORDER — FUROSEMIDE 80 MG PO TABS: 80.0000 mg | ORAL_TABLET | Freq: Every day | ORAL | Status: DC

## 2024-03-20 MED ORDER — LEVALBUTEROL HCL 0.63 MG/3ML IN NEBU: 0.6300 mg | INHALATION_SOLUTION | RESPIRATORY_TRACT | 12 refills | Status: DC | PRN

## 2024-03-20 NOTE — Plan of Care (Signed)
 Problem: Education: Goal: Knowledge of General Education information will improve Description: Including pain rating scale, medication(s)/side effects and non-pharmacologic comfort measures 03/20/2024 1146 by Terance Leonor BRAVO, RN Outcome: Progressing 03/20/2024 1004 by Terance Leonor BRAVO, RN Outcome: Progressing   Problem: Health Behavior/Discharge Planning: Goal: Ability to manage health-related needs will improve 03/20/2024 1146 by Terance Leonor BRAVO, RN Outcome: Progressing 03/20/2024 1004 by Terance Leonor BRAVO, RN Outcome: Progressing   Problem: Clinical Measurements: Goal: Ability to maintain clinical measurements within normal limits will improve 03/20/2024 1146 by Terance Leonor BRAVO, RN Outcome: Progressing 03/20/2024 1004 by Terance Leonor BRAVO, RN Outcome: Progressing Goal: Will remain free from infection 03/20/2024 1146 by Terance Leonor BRAVO, RN Outcome: Progressing 03/20/2024 1004 by Terance Leonor BRAVO, RN Outcome: Progressing Goal: Diagnostic test results will improve 03/20/2024 1146 by Terance Leonor BRAVO, RN Outcome: Progressing 03/20/2024 1004 by Terance Leonor BRAVO, RN Outcome: Progressing Goal: Respiratory complications will improve 03/20/2024 1146 by Terance Leonor BRAVO, RN Outcome: Progressing 03/20/2024 1004 by Terance Leonor BRAVO, RN Outcome: Progressing Goal: Cardiovascular complication will be avoided 03/20/2024 1146 by Terance Leonor BRAVO, RN Outcome: Progressing 03/20/2024 1004 by Terance Leonor BRAVO, RN Outcome: Progressing   Problem: Activity: Goal: Risk for activity intolerance will decrease 03/20/2024 1146 by Terance Leonor BRAVO, RN Outcome: Progressing 03/20/2024 1004 by Terance Leonor BRAVO, RN Outcome: Progressing   Problem: Nutrition: Goal: Adequate nutrition will be maintained 03/20/2024 1146 by Terance Leonor BRAVO, RN Outcome: Progressing 03/20/2024 1004 by Terance Leonor BRAVO, RN Outcome: Progressing   Problem: Coping: Goal: Level  of anxiety will decrease 03/20/2024 1146 by Terance Leonor BRAVO, RN Outcome: Progressing 03/20/2024 1004 by Terance Leonor BRAVO, RN Outcome: Progressing   Problem: Elimination: Goal: Will not experience complications related to bowel motility 03/20/2024 1146 by Terance Leonor BRAVO, RN Outcome: Progressing 03/20/2024 1004 by Terance Leonor BRAVO, RN Outcome: Progressing Goal: Will not experience complications related to urinary retention 03/20/2024 1146 by Terance Leonor BRAVO, RN Outcome: Progressing 03/20/2024 1004 by Terance Leonor BRAVO, RN Outcome: Progressing   Problem: Pain Managment: Goal: General experience of comfort will improve and/or be controlled 03/20/2024 1146 by Terance Leonor BRAVO, RN Outcome: Progressing 03/20/2024 1004 by Terance Leonor BRAVO, RN Outcome: Progressing   Problem: Safety: Goal: Ability to remain free from injury will improve 03/20/2024 1146 by Terance Leonor BRAVO, RN Outcome: Progressing 03/20/2024 1004 by Terance Leonor BRAVO, RN Outcome: Progressing   Problem: Skin Integrity: Goal: Risk for impaired skin integrity will decrease 03/20/2024 1146 by Terance Leonor BRAVO, RN Outcome: Progressing 03/20/2024 1004 by Terance Leonor BRAVO, RN Outcome: Progressing   Problem: Education: Goal: Ability to describe self-care measures that may prevent or decrease complications (Diabetes Survival Skills Education) will improve 03/20/2024 1146 by Terance Leonor BRAVO, RN Outcome: Progressing 03/20/2024 1004 by Terance Leonor BRAVO, RN Outcome: Progressing Goal: Individualized Educational Video(s) 03/20/2024 1146 by Terance Leonor BRAVO, RN Outcome: Progressing 03/20/2024 1004 by Terance Leonor BRAVO, RN Outcome: Progressing   Problem: Coping: Goal: Ability to adjust to condition or change in health will improve 03/20/2024 1146 by Terance Leonor BRAVO, RN Outcome: Progressing 03/20/2024 1004 by Terance Leonor BRAVO, RN Outcome: Progressing   Problem: Fluid Volume: Goal:  Ability to maintain a balanced intake and output will improve 03/20/2024 1146 by Terance Leonor BRAVO, RN Outcome: Progressing 03/20/2024 1004 by Terance Leonor BRAVO, RN Outcome: Progressing   Problem: Health Behavior/Discharge Planning: Goal: Ability to identify and utilize available resources and services will improve 03/20/2024 1146 by Terance Leonor BRAVO, RN Outcome: Progressing 03/20/2024 1004 by  Terance Leonor BRAVO, RN Outcome: Progressing Goal: Ability to manage health-related needs will improve 03/20/2024 1146 by Terance Leonor BRAVO, RN Outcome: Progressing 03/20/2024 1004 by Terance Leonor BRAVO, RN Outcome: Progressing   Problem: Metabolic: Goal: Ability to maintain appropriate glucose levels will improve 03/20/2024 1146 by Terance Leonor BRAVO, RN Outcome: Progressing 03/20/2024 1004 by Terance Leonor BRAVO, RN Outcome: Progressing   Problem: Nutritional: Goal: Maintenance of adequate nutrition will improve 03/20/2024 1146 by Terance Leonor BRAVO, RN Outcome: Progressing 03/20/2024 1004 by Terance Leonor BRAVO, RN Outcome: Progressing Goal: Progress toward achieving an optimal weight will improve 03/20/2024 1146 by Terance Leonor BRAVO, RN Outcome: Progressing 03/20/2024 1004 by Terance Leonor BRAVO, RN Outcome: Progressing   Problem: Skin Integrity: Goal: Risk for impaired skin integrity will decrease 03/20/2024 1146 by Terance Leonor BRAVO, RN Outcome: Progressing 03/20/2024 1004 by Terance Leonor BRAVO, RN Outcome: Progressing   Problem: Tissue Perfusion: Goal: Adequacy of tissue perfusion will improve 03/20/2024 1146 by Terance Leonor BRAVO, RN Outcome: Progressing 03/20/2024 1004 by Terance Leonor BRAVO, RN Outcome: Progressing

## 2024-03-20 NOTE — Progress Notes (Signed)
 Mobility Specialist - Progress Note  (2L [place]) Pre-mobility: 106 bpm HR, 97% SpO2 During mobility: 125 bpm HR, 98% SpO2 Post-mobility: 97 bpm HR, 99% SPO2   03/20/24 1125  Mobility  Activity Ambulated with assistance  Level of Assistance Contact guard assist, steadying assist  Assistive Device Front wheel walker  Distance Ambulated (ft) 125 ft  Range of Motion/Exercises Active  Activity Response Tolerated well  Mobility Referral Yes  Mobility visit 1 Mobility  Mobility Specialist Start Time (ACUTE ONLY) 1110  Mobility Specialist Stop Time (ACUTE ONLY) 1125  Mobility Specialist Time Calculation (min) (ACUTE ONLY) 15 min   Pt was found on recliner chair and agreeable to mobilize. Took x3 seated rest breaks at ~81ft, ~58ft, ~87ft. At EOS returned and attempted to have BM but unsuccessful. Was left in bed with all needs met. Call bell in reach.   Erminio Leos,  Mobility Specialist Can be reached via Secure Chat

## 2024-03-20 NOTE — Progress Notes (Signed)
 Pt not able to void after a BSC trial. Bladder scanned for 312 ml. NP [name] notified and order an IN and OUT with an output of 250 mls. Patient stated feels better. Bladder scan unavailable to check a PVR after the IN and OUT.

## 2024-03-20 NOTE — TOC Transition Note (Signed)
 Transition of Care [facility]) - Discharge Note   Patient Details  Name: Jean Davidson MRN: [id number] Date of Birth: October 17, 1945  [facility] CM/SW Contact:  Tawni CHRISTELLA Eva, LCSW Phone Number: 03/20/2024, 12:49 PM   Clinical Narrative:    [facility] Pt's room number is 704, Rn to call report to [phone]. PTAR called IPCM sign off.    Final next level of care: Skilled Nursing Facility Barriers to Discharge: Barriers Resolved   Patient Goals and CMS Choice Patient states their goals for this hospitalization and ongoing recovery are:: Return home CMS Medicare.gov Compare Post Acute Care list provided to:: Other (Comment Required) (NA) Choice offered to / list presented to : NA [place] ownership interest in [facility].provided to:: Parent NA    Discharge Placement                    Patient and family notified of of transfer: 03/20/24  Discharge Plan and Services Additional resources added to the After Visit Summary for   In-house Referral: NA Discharge Planning Services: CM Consult Post Acute Care Choice: NA          DME Arranged: N/A DME Agency: NA       HH Arranged: NA HH Agency: NA        Social Drivers of Health (SDOH) Interventions SDOH Screenings   Food Insecurity: No Food Insecurity (03/09/2024)  Housing: Low Risk  (03/09/2024)  Transportation Needs: No Transportation Needs (03/09/2024)  Utilities: Not At Risk (03/09/2024)  Depression (PHQ2-9): Low Risk  (03/05/2024)  Recent Concern: Depression (PHQ2-9) - Medium Risk (02/21/2024)  Financial Resource Strain: Low Risk  (10/09/2023)   Received from [facility]  Social Connections: Moderately Integrated (03/09/2024)  Tobacco Use: Medium Risk (03/09/2024)     Readmission Risk Interventions     No data to display

## 2024-03-20 NOTE — Plan of Care (Signed)
 Problem: Education: Goal: Knowledge of General Education information will improve Description: Including pain rating scale, medication(s)/side effects and non-pharmacologic comfort measures 03/20/2024 1146 by Terance Leonor BRAVO, RN Outcome: Adequate for Discharge 03/20/2024 1146 by Terance Leonor BRAVO, RN Outcome: Progressing 03/20/2024 1004 by Terance Leonor BRAVO, RN Outcome: Progressing   Problem: Health Behavior/Discharge Planning: Goal: Ability to manage health-related needs will improve 03/20/2024 1146 by Terance Leonor BRAVO, RN Outcome: Adequate for Discharge 03/20/2024 1146 by Terance Leonor BRAVO, RN Outcome: Progressing 03/20/2024 1004 by Terance Leonor BRAVO, RN Outcome: Progressing   Problem: Clinical Measurements: Goal: Ability to maintain clinical measurements within normal limits will improve 03/20/2024 1146 by Terance Leonor BRAVO, RN Outcome: Adequate for Discharge 03/20/2024 1146 by Terance Leonor BRAVO, RN Outcome: Progressing 03/20/2024 1004 by Terance Leonor BRAVO, RN Outcome: Progressing Goal: Will remain free from infection 03/20/2024 1146 by Terance Leonor BRAVO, RN Outcome: Adequate for Discharge 03/20/2024 1146 by Terance Leonor BRAVO, RN Outcome: Progressing 03/20/2024 1004 by Terance Leonor BRAVO, RN Outcome: Progressing Goal: Diagnostic test results will improve 03/20/2024 1146 by Terance Leonor BRAVO, RN Outcome: Adequate for Discharge 03/20/2024 1146 by Terance Leonor BRAVO, RN Outcome: Progressing 03/20/2024 1004 by Terance Leonor BRAVO, RN Outcome: Progressing Goal: Respiratory complications will improve 03/20/2024 1146 by Terance Leonor BRAVO, RN Outcome: Adequate for Discharge 03/20/2024 1146 by Terance Leonor BRAVO, RN Outcome: Progressing 03/20/2024 1004 by Terance Leonor BRAVO, RN Outcome: Progressing Goal: Cardiovascular complication will be avoided 03/20/2024 1146 by Terance Leonor BRAVO, RN Outcome: Adequate for Discharge 03/20/2024 1146 by Terance Leonor BRAVO, RN Outcome: Progressing 03/20/2024 1004 by Terance Leonor BRAVO, RN Outcome: Progressing   Problem: Activity: Goal: Risk for activity intolerance will decrease 03/20/2024 1146 by Terance Leonor BRAVO, RN Outcome: Adequate for Discharge 03/20/2024 1146 by Terance Leonor BRAVO, RN Outcome: Progressing 03/20/2024 1004 by Terance Leonor BRAVO, RN Outcome: Progressing   Problem: Nutrition: Goal: Adequate nutrition will be maintained 03/20/2024 1146 by Terance Leonor BRAVO, RN Outcome: Adequate for Discharge 03/20/2024 1146 by Terance Leonor BRAVO, RN Outcome: Progressing 03/20/2024 1004 by Terance Leonor BRAVO, RN Outcome: Progressing   Problem: Coping: Goal: Level of anxiety will decrease 03/20/2024 1146 by Terance Leonor BRAVO, RN Outcome: Adequate for Discharge 03/20/2024 1146 by Terance Leonor BRAVO, RN Outcome: Progressing 03/20/2024 1004 by Terance Leonor BRAVO, RN Outcome: Progressing   Problem: Elimination: Goal: Will not experience complications related to bowel motility 03/20/2024 1146 by Terance Leonor BRAVO, RN Outcome: Adequate for Discharge 03/20/2024 1146 by Terance Leonor BRAVO, RN Outcome: Progressing 03/20/2024 1004 by Terance Leonor BRAVO, RN Outcome: Progressing Goal: Will not experience complications related to urinary retention 03/20/2024 1146 by Terance Leonor BRAVO, RN Outcome: Adequate for Discharge 03/20/2024 1146 by Terance Leonor BRAVO, RN Outcome: Progressing 03/20/2024 1004 by Terance Leonor BRAVO, RN Outcome: Progressing   Problem: Pain Managment: Goal: General experience of comfort will improve and/or be controlled 03/20/2024 1146 by Terance Leonor BRAVO, RN Outcome: Adequate for Discharge 03/20/2024 1146 by Terance Leonor BRAVO, RN Outcome: Progressing 03/20/2024 1004 by Terance Leonor BRAVO, RN Outcome: Progressing   Problem: Safety: Goal: Ability to remain free from injury will improve 03/20/2024 1146 by Terance Leonor BRAVO, RN Outcome: Adequate for  Discharge 03/20/2024 1146 by Terance Leonor BRAVO, RN Outcome: Progressing 03/20/2024 1004 by Terance Leonor BRAVO, RN Outcome: Progressing   Problem: Skin Integrity: Goal: Risk for impaired skin integrity will decrease 03/20/2024 1146 by Terance Leonor BRAVO, RN Outcome: Adequate for Discharge 03/20/2024 1146 by Terance Leonor BRAVO, RN Outcome: Progressing 03/20/2024 1004 by Terance,  Leonor BRAVO, RN Outcome: Progressing   Problem: Education: Goal: Ability to describe self-care measures that may prevent or decrease complications (Diabetes Survival Skills Education) will improve 03/20/2024 1146 by Terance Leonor BRAVO, RN Outcome: Adequate for Discharge 03/20/2024 1146 by Terance Leonor BRAVO, RN Outcome: Progressing 03/20/2024 1004 by Terance Leonor BRAVO, RN Outcome: Progressing Goal: Individualized Educational Video(s) 03/20/2024 1146 by Terance Leonor BRAVO, RN Outcome: Adequate for Discharge 03/20/2024 1146 by Terance Leonor BRAVO, RN Outcome: Progressing 03/20/2024 1004 by Terance Leonor BRAVO, RN Outcome: Progressing   Problem: Coping: Goal: Ability to adjust to condition or change in health will improve 03/20/2024 1146 by Terance Leonor BRAVO, RN Outcome: Adequate for Discharge 03/20/2024 1146 by Terance Leonor BRAVO, RN Outcome: Progressing 03/20/2024 1004 by Terance Leonor BRAVO, RN Outcome: Progressing   Problem: Fluid Volume: Goal: Ability to maintain a balanced intake and output will improve 03/20/2024 1146 by Terance Leonor BRAVO, RN Outcome: Adequate for Discharge 03/20/2024 1146 by Terance Leonor BRAVO, RN Outcome: Progressing 03/20/2024 1004 by Terance Leonor BRAVO, RN Outcome: Progressing   Problem: Health Behavior/Discharge Planning: Goal: Ability to identify and utilize available resources and services will improve 03/20/2024 1146 by Terance Leonor BRAVO, RN Outcome: Adequate for Discharge 03/20/2024 1146 by Terance Leonor BRAVO, RN Outcome: Progressing 03/20/2024 1004 by Terance Leonor BRAVO, RN Outcome: Progressing Goal: Ability to manage health-related needs will improve 03/20/2024 1146 by Terance Leonor BRAVO, RN Outcome: Adequate for Discharge 03/20/2024 1146 by Terance Leonor BRAVO, RN Outcome: Progressing 03/20/2024 1004 by Terance Leonor BRAVO, RN Outcome: Progressing   Problem: Metabolic: Goal: Ability to maintain appropriate glucose levels will improve 03/20/2024 1146 by Terance Leonor BRAVO, RN Outcome: Adequate for Discharge 03/20/2024 1146 by Terance Leonor BRAVO, RN Outcome: Progressing 03/20/2024 1004 by Terance Leonor BRAVO, RN Outcome: Progressing   Problem: Nutritional: Goal: Maintenance of adequate nutrition will improve 03/20/2024 1146 by Terance Leonor BRAVO, RN Outcome: Adequate for Discharge 03/20/2024 1146 by Terance Leonor BRAVO, RN Outcome: Progressing 03/20/2024 1004 by Terance Leonor BRAVO, RN Outcome: Progressing Goal: Progress toward achieving an optimal weight will improve 03/20/2024 1146 by Terance Leonor BRAVO, RN Outcome: Adequate for Discharge 03/20/2024 1146 by Terance Leonor BRAVO, RN Outcome: Progressing 03/20/2024 1004 by Terance Leonor BRAVO, RN Outcome: Progressing   Problem: Skin Integrity: Goal: Risk for impaired skin integrity will decrease 03/20/2024 1146 by Terance Leonor BRAVO, RN Outcome: Adequate for Discharge 03/20/2024 1146 by Terance Leonor BRAVO, RN Outcome: Progressing 03/20/2024 1004 by Terance Leonor BRAVO, RN Outcome: Progressing   Problem: Tissue Perfusion: Goal: Adequacy of tissue perfusion will improve 03/20/2024 1146 by Terance Leonor BRAVO, RN Outcome: Adequate for Discharge 03/20/2024 1146 by Terance Leonor BRAVO, RN Outcome: Progressing 03/20/2024 1004 by Terance Leonor BRAVO, RN Outcome: Progressing

## 2024-03-20 NOTE — Plan of Care (Signed)

## 2024-03-20 NOTE — Discharge Summary (Signed)
 Physician Discharge Summary   Patient: Jean Davidson MRN: [id number] DOB: Dec 02, 1945  Admit date:     03/08/2024  Discharge date: 03/20/24  Discharge Physician: Nydia Distance, MD    PCP: Patient, [name]   Recommendations at discharge:   Continue Lasix  80 mg p.o. daily Foley catheter placed again on 03/20/2024, please arrange outpatient follow-up with [facility] for further workup, cystoscopy/urodynamics studies and voiding trial.  Discharge Diagnoses: :   Severe sepsis (HCC)   Acute hypoxic respiratory failure (HCC)   CAP (community acquired pneumonia)   Multiple myeloma   Essential hypertension   Anemia due to antineoplastic chemotherapy   Diarrhea   GERD (gastroesophageal reflux disease)   Peripheral neuropathy due to chemotherapy  Hospital Course:  78 year old female with past medical history of relapsed/Refractory IgA Lambda Multiple Myeloma, GERD, HTN, macular degeneration, cervical spinal stenosis presented to hospital with shortness of breath.  Patient was recently seen as outpatient for low-grade fever and was presumed to have UTI and received ciprofloxacin .  On this presentation she presented with progressive shortness of breath nonproductive cough and low-grade fever of 101.  In the ED, patient was slightly tachycardic and needed supplemental oxygen .  Chest x-ray did not show any infiltrate.  Subsequently, CT angiogram of the chest was done which showed negative for PE but bilateral airspace disease consistent with multifocal pneumonia..  Initial labs were notable for WBC at 4.2 with 2% bands,on elranatamab .  COVID, flu, RSV negative.  Procalcitonin was negative.  Patient had elevated lactate on presentation with trended up to 5.3.  Received 3-1/2 L of IV fluids in the ED.  Continue vancomycin  cefepime  and Flagyl .  Given acute respiratory failure, patient also required BiPAP due to increased work of breathing and was admitted to the hospital for further evaluation and  treatment.  During hospitalization patient had a prolonged course and was followed by oncology and received IVIG.  At this time oxygen  has been weaned to 2 L but is still on Foley catheter due to urinary retention.  Foley catheter was removed 9/24, however patient continued to have urinary retention and Foley catheter was replaced on 9/25.  Seen by urology, outpatient follow-up with [facility] for further workup and voiding trial.   Assessment and Plan:  Severe sepsis likely secondary to multifocal community-acquired pneumonia. - Completed course of IV vancomycin , cefepime  and Flagyl  - Seen by PCCM, infectious disease - had not been taking  atovaquone  at home due to taste (takes every couple days).   - Currently stable, sepsis physiology has resolved.  Completed antibiotics.   Acute hypoxic respiratory failure secondary to severe multifocal pneumonia and heart failure.  -  PE was ruled out. Was initially on high flow nasal cannula and BiPAP, Solu-Medrol .  PCCM followed the patient during hospitalization.  Patient has completed course of antibiotic and steroids at this time.   -   2D echo on 9/16 showed EF of 35 to 40% with no regional W MA, G1 DD.  - Cardiology has signed off, recommended metoprolol  XL 25 mg daily, Lasix  80 mg daily.  GDMT limited by soft BP.   Acute systolic heart failure    - New findings of LV ejection fraction of 35 to 40%.  -  Was intially on 80 mg of IV Lasix  twice daily with good diuresis.   - Negative balance of 6.1 L, weight down from 167.7lbs  on admission to 156 - Seen by cardiology, recommended continue metoprolol , Lasix  80 mg daily  -  Creatinine stable, 0.9 at discharge    Relapsed multiple myeloma with acquired hypogammaglobinemia - Follows with Dr. Federico oncology as outpatient.  - 2016: Underwent autologous stem cell transplant. 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 10/16/2022: Completed CAR-T therapy with [employer]. 09/2023: Started on Elrexfio   due to progression of disease. 01/02/2024: Last visit at [facility] where induction therapy was completed - Now on Elranatamab  (Elrexfio ); just had cycle 6 on 02/27/24 - Continue atovaquone  and acyclovir .  Oncology on board.  Status post IVIG administration. - Patient has an appointment with oncology on 10/1 with oncology APP and 10/8 (Dr. Federico ) after discharge   Hypokalemia.   - Replace as needed   Anemia due to antineoplastic chemotherapy Leukopenia/thrombocytopenia. -H&H stable   Essential hypertension Continue Lasix   and metoprolol .     GERD (gastroesophageal reflux disease) Continue Protonix    Debility weakness.  - PT recommended SNF   Acute urinary retention.  - Foley catheter removed 9/24, however patient did not have successful voiding trial and needed straight cath x 2.  Foley catheter was placed.  Urology was consulted and recommended outpatient follow-up for urodynamic studies/cystoscopy and voiding trial   Estimated body mass index is 28.59 kg/m as calculated from the following:   Height as of this encounter: 5' 2 (1.575 m).   Weight as of this encounter: 70.9 kg.      Pain control -   Controlled Substance Reporting System database was reviewed. and patient was instructed, not to drive, operate heavy machinery, perform activities at heights, swimming or participation in water activities or provide baby-sitting services while on Pain, Sleep and Anxiety Medications; until their outpatient Physician has advised to do so again. Also recommended to not to take more than prescribed Pain, Sleep and Anxiety Medications.  Consultants: Cardiology, PCCM, oncology, urology, ID Procedures performed:   Disposition: Skilled nursing facility Diet recommendation:  Discharge Diet Orders (From admission, onward)     Start     Ordered   03/20/24 0000  Diet - low sodium heart healthy        03/20/24 1114            DISCHARGE MEDICATION: Allergies as  of 03/20/2024   No Known Allergies      Medication List     STOP taking these medications    ciprofloxacin  250 MG tablet Commonly known as: Cipro    metoprolol  tartrate 25 MG tablet Commonly known as: LOPRESSOR        TAKE these medications    acyclovir  400 MG tablet Commonly known as: ZOVIRAX  Take 1 tablet (400 mg total) by mouth 2 (two) times daily.   aspirin  EC 81 MG tablet Take 81 mg by mouth daily. Swallow whole.   atovaquone  750 MG/5ML suspension Commonly known as: MEPRON  TAKE 10 MLS (1,500 MG TOTAL) BY MOUTH 2 (TWO) TIMES DAILY.   cholecalciferol  25 MCG (1000 UNIT) tablet Commonly known as: VITAMIN D3 Take 4,000 Units by mouth daily. Instructed to increase to 4,000 units daily   furosemide  80 MG tablet Commonly known as: LASIX  Take 1 tablet (80 mg total) by mouth daily.   guaiFENesin -dextromethorphan  100-10 MG/5ML syrup Commonly known as: ROBITUSSIN DM Take 5 mLs by mouth every 4 (four) hours as needed for cough (chest congestion).   levalbuterol  0.63 MG/3ML nebulizer solution Commonly known as: XOPENEX  Take 3 mLs (0.63 mg total) by nebulization every 4 (four) hours as needed for wheezing or shortness of breath.   melatonin 5 MG Tabs Take 1 tablet (5  mg total) by mouth at bedtime as needed.   metoprolol  succinate 25 MG 24 hr tablet Commonly known as: TOPROL -XL Take 1 tablet (25 mg total) by mouth daily.   mirtazapine  15 MG tablet Commonly known as: REMERON  TAKE 1 TABLET BY MOUTH EVERYDAY AT BEDTIME   ondansetron  8 MG tablet Commonly known as: Zofran  Take 1 tablet (8 mg total) by mouth every 8 (eight) hours as needed (Nausea or vomiting). What changed: when to take this   pantoprazole  40 MG tablet Commonly known as: PROTONIX  TAKE 1 TABLET BY MOUTH 2 TIMES DAILY. TAKE 30-60 MINUTES BEFORE BREAKFAST AND DINNER.   PRESERVISION AREDS 2 PO Take 1 tablet by mouth 2 (two) times daily.   prochlorperazine  5 MG tablet Commonly known as: COMPAZINE  Take  5 mg by mouth every 6 (six) hours as needed for nausea or vomiting.   saccharomyces boulardii 250 MG capsule Commonly known as: FLORASTOR Take 1 capsule (250 mg total) by mouth 2 (two) times daily.   senna-docusate 8.6-50 MG tablet Commonly known as: Senokot-S Take 2 tablets by mouth daily as needed.   tamsulosin  0.4 MG Caps capsule Commonly known as: FLOMAX  Take 1 capsule (0.4 mg total) by mouth daily for 7 days.        Contact information for follow-up providers     Federico Norleen ONEIDA MADISON, MD. Schedule an appointment as soon as possible for a visit in 2 week(s).   Specialty: Hematology and Oncology Why: for hospital follow-up Contact information: [address] [place] [address] [phone]         [facility]. Schedule an appointment as soon as possible for a visit in 1 week(s).   Why: for hospital follow-up and voiding trial Contact information: [address] [place] [address] [place]  [address] [phone]        Lavona Agent, MD. Schedule an appointment as soon as possible for a visit in 2 week(s).   Specialty: Cardiology Why: for hospital follow-up Contact information: [address] [place] [address] [phone]              Contact information for after-discharge care     Destination     [facility] [place], [place]. [place]   Service: Skilled Nursing Contact information: [address] [place]  [address] [phone]                    Discharge Exam: Fredricka Weights   03/15/24 0640 03/16/24 2000 03/18/24 2121  Weight: 69.7 kg 70.8 kg 70.9 kg   S: Patient reports that she was not able to void successfully and needed straight cath x 2.  Foley catheter placed, urology consulted, cleared for discharge to SNF.  BP 127/68   Pulse 87   Temp (!) 97.5 F (36.4 C) (Oral)   Resp 18   Ht 5' 2 (1.575 m)   Wt 70.9 kg   SpO2 100%   BMI 28.59 kg/m   Physical Exam General: Alert and  oriented x 3, NAD Cardiovascular: S1 S2 clear, RRR.  Respiratory: CTAB, no wheezing Gastrointestinal: Soft, nontender, nondistended, NBS Ext: no pedal edema bilaterally Neuro: no new deficits Psych: Normal affect    Condition at discharge: fair  The results of significant diagnostics from this hospitalization (including imaging, microbiology, ancillary and laboratory) are listed below for reference.   Imaging Studies: ECHOCARDIOGRAM COMPLETE Result Date: 03/11/2024    ECHOCARDIOGRAM REPORT   Patient Name:   Dijon L Bacci Date of Exam:  03/11/2024 Medical Rec #:  [id number]      Height:       62.0 in Accession #:    [id number]     Weight:       160.0 lb Date of Birth:  01-09-1946      BSA:          1.739 m Patient Age:    77 years       BP:           94/54 mmHg Patient Gender: F              HR:           90 bpm. Exam Location:  Inpatient Procedure: 2D Echo, Intracardiac Opacification Agent, Cardiac Doppler and Color            Doppler (Both Spectral and Color Flow Doppler were utilized during            procedure). Indications:    CHF-Acute Diastolic I50.31  History:        Patient has prior history of Echocardiogram examinations, most                 recent 04/20/2021. Risk Factors:Hypertension and Former Smoker.  Sonographer:    Koleen Popper RDCS Referring Phys: [id number] PAULA SOUTHERLY IMPRESSIONS  1. Left ventricular ejection fraction, by estimation, is 35 to 40%. The left ventricle has moderately decreased function. The left ventricle has no regional wall motion abnormalities. Left ventricular diastolic parameters are consistent with Grade I diastolic dysfunction (impaired relaxation).  2. Right ventricular systolic function is normal. The right ventricular size is normal. There is mildly elevated pulmonary artery systolic pressure.  3. The mitral valve is normal in structure. Moderate mitral valve regurgitation. No evidence of mitral stenosis.  4. The aortic valve is normal in structure. Aortic  valve regurgitation is not visualized. No aortic stenosis is present.  5. The inferior vena cava is normal in size with greater than 50% respiratory variability, suggesting right atrial pressure of 3 mmHg. FINDINGS  Left Ventricle: Left ventricular ejection fraction, by estimation, is 35 to 40%. The left ventricle has moderately decreased function. The left ventricle has no regional wall motion abnormalities. Definity  contrast agent was given IV to delineate the left ventricular endocardial borders. The left ventricular internal cavity size was normal in size. There is no left ventricular hypertrophy. Left ventricular diastolic parameters are consistent with Grade I diastolic dysfunction (impaired relaxation).  LV Wall Scoring: The apex is akinetic. The apical lateral segment, mid inferoseptal segment, apical septal segment, apical anterior segment, and apical inferior segment are hypokinetic. Right Ventricle: The right ventricular size is normal. No increase in right ventricular wall thickness. Right ventricular systolic function is normal. There is mildly elevated pulmonary artery systolic pressure. The tricuspid regurgitant velocity is 3.03  m/s, and with an assumed right atrial pressure of 8 mmHg, the estimated right ventricular systolic pressure is 44.7 mmHg. Left Atrium: Left atrial size was normal in size. Right Atrium: Right atrial size was normal in size. Pericardium: There is no evidence of pericardial effusion. Mitral Valve: The mitral valve is normal in structure. Mild mitral annular calcification. Moderate mitral valve regurgitation. No evidence of mitral valve stenosis. MV peak gradient, 6.2 mmHg. The mean mitral valve gradient is 3.0 mmHg. Tricuspid Valve: The tricuspid valve is normal in structure. Tricuspid valve regurgitation is mild . No evidence of tricuspid stenosis. Aortic Valve: The aortic valve is normal in structure. Aortic valve regurgitation is not  visualized. No aortic stenosis is  present. Pulmonic Valve: The pulmonic valve was normal in structure. Pulmonic valve regurgitation is trivial. No evidence of pulmonic stenosis. Aorta: The aortic root is normal in size and structure. Venous: The inferior vena cava is normal in size with greater than 50% respiratory variability, suggesting right atrial pressure of 3 mmHg. IAS/Shunts: No atrial level shunt detected by color flow Doppler.  LEFT VENTRICLE PLAX 2D LVIDd:         4.20 cm   Diastology LVIDs:         3.10 cm   LV e' medial:    6.53 cm/s LV PW:         0.80 cm   LV E/e' medial:  15.2 LV IVS:        1.10 cm   LV e' lateral:   6.74 cm/s LVOT diam:     1.80 cm   LV E/e' lateral: 14.8 LV SV:         45 LV SV Index:   26 LVOT Area:     2.54 cm  RIGHT VENTRICLE             IVC RV S prime:     15.20 cm/s  IVC diam: 2.40 cm TAPSE (M-mode): 2.4 cm LEFT ATRIUM           Index        RIGHT ATRIUM           Index LA diam:      4.00 cm 2.30 cm/m   RA Area:     10.90 cm LA Vol (A2C): 36.0 ml 20.70 ml/m  RA Volume:   24.60 ml  14.15 ml/m LA Vol (A4C): 52.6 ml 30.25 ml/m  AORTIC VALVE LVOT Vmax:   102.00 cm/s LVOT Vmean:  70.600 cm/s LVOT VTI:    0.176 m  AORTA Ao Root diam: 2.80 cm Ao Asc diam:  3.70 cm MITRAL VALVE                  TRICUSPID VALVE MV Area (PHT): 7.16 cm       TR Peak grad:   36.7 mmHg MV Area VTI:   1.89 cm       TR Vmax:        303.00 cm/s MV Peak grad:  6.2 mmHg MV Mean grad:  3.0 mmHg       SHUNTS MV Vmax:       1.25 m/s       Systemic VTI:  0.18 m MV Vmean:      81.2 cm/s      Systemic Diam: 1.80 cm MV Decel Time: 106 msec MR Peak grad:    108.8 mmHg MR Mean grad:    79.0 mmHg MR Vmax:         521.50 cm/s MR Vmean:        427.0 cm/s MR PISA:         1.01 cm MR PISA Eff ROA: 7 mm MR PISA Radius:  0.40 cm MV E velocity: 99.50 cm/s MV A velocity: 78.80 cm/s MV E/A ratio:  1.26 Aditya Sabharwal Electronically signed by Ria Commander Signature Date/Time: 03/11/2024/5:59:16 PM    Final    CT Angio Chest Pulmonary Embolism (PE)  W or WO Contrast Result Date: 03/09/2024 EXAM: CTA of the Chest with contrast for PE 03/09/2024 12:17:09 AM TECHNIQUE: CTA of the chest was performed after the administration of intravenous contrast. Multiplanar reformatted images are provided for review.  MIP images are provided for review. Automated exposure control, iterative reconstruction, and/or weight based adjustment of the mA/kV was utilized to reduce the radiation dose to as low as reasonably achievable. COMPARISON: None available. CLINICAL HISTORY: Pulmonary embolism (PE) suspected, high prob. FINDINGS: PULMONARY ARTERIES: Pulmonary arteries are adequately opacified for evaluation. No pulmonary embolism. Main pulmonary artery is normal in caliber. MEDIASTINUM: The heart and pericardium demonstrate no acute abnormality. No pericardial effusion. Mild atherosclerotic calcification within the thoracic aorta. No aortic aneurysm. LYMPH NODES: No mediastinal, hilar or axillary lymphadenopathy. LUNGS AND PLEURA: Extensive bilateral airspace infiltrate with bilateral lower lobe dependent consolidation in keeping with multifocal infection or aspiration. Small bilateral pleural effusions. No pneumothorax. No central obstructing lesion. UPPER ABDOMEN: Moderate hiatal hernia. Cholelithiasis without superimposed pericholecystic inflammatory change. No intra or extrahepatic biliary ductal dilation. SOFT TISSUES AND BONES: Sclerotic margined lytic lesion within the T3 vertebral body, possibly an intraosseous hemangioma, with superimposed pathologic compression deformity noted. This is likely chronic in nature. Chronic mild L5 compression deformity noted. No retropulsion. Osseous structures are otherwise age appropriate. Right reverse shoulder arthroplasty noted. No acute bone abnormality. IMPRESSION: 1. No pulmonary embolism. 2. Extensive bilateral airspace infiltrate with bilateral lower lobe dependent consolidation, consistent with multifocal infection or aspiration.  3. Small bilateral pleural effusions. 4. Moderate hiatal hernia. Electronically signed by: Dorethia Molt MD 03/09/2024 12:24 AM EDT RP Workstation: [id number]   DG Chest Port 1 View Result Date: 03/08/2024 EXAM: 1 VIEW XRAY OF THE CHEST 03/08/2024 11:37:42 AM COMPARISON: 03/05/2024 CLINICAL HISTORY: Questionable sepsis - evaluate for abnormality. Per chart: tachycardia. She is currently receiving chemo. Was seen last week for fevers at outpatient office. 89% on RA during triage. FINDINGS: LUNGS AND PLEURA: Calcified right lower lobe granuloma. No focal pulmonary opacity. No pulmonary edema. No pleural effusion. No pneumothorax. HEART AND MEDIASTINUM: Aortic calcification. No acute abnormality of the cardiac and mediastinal silhouettes. BONES AND SOFT TISSUES: Right shoulder arthroplasty noted. Old healed right rib fractures. Degenerative changes of spine. No acute osseous abnormality. IMPRESSION: 1. No acute findings. Electronically signed by: Waddell Calk MD 03/08/2024 12:10 PM EDT RP Workstation: [id number]   DG Chest 2 View Result Date: 03/05/2024 CLINICAL DATA:  Fever, shortness of breath EXAM: CHEST - 2 VIEW COMPARISON:  April 05, 2021 chest radiograph FINDINGS: Cyst unchanged chronic calcified pulmonary granuloma in the right lower lung. No focal consolidations. No pleural effusions. No pneumothorax. Unchanged cardiomediastinal silhouette. No acute osseous findings. Partially imaged right shoulder arthroplasty hardware. IMPRESSION: No acute findings. Electronically Signed   By: Michaeline Blanch M.D.   On: 03/05/2024 12:07    Microbiology: Results for orders placed or performed during the hospital encounter of 03/08/24  Resp panel by RT-PCR (RSV, Flu A&B, Covid) Anterior Nasal Swab     Status: None   Collection Time: 03/08/24 11:38 AM   Specimen: Anterior Nasal Swab  Result Value Ref Range Status   SARS Coronavirus 2 by RT PCR NEGATIVE NEGATIVE Final    Comment: (NOTE) SARS-CoV-2 target nucleic  acids are NOT DETECTED.  The SARS-CoV-2 RNA is generally detectable in upper respiratory specimens during the acute phase of infection. The lowest concentration of SARS-CoV-2 viral copies this assay can detect is 138 copies/mL. A negative result does not preclude SARS-Cov-2 infection and should not be used as the sole basis for treatment or other patient management decisions. A negative result may occur with  improper specimen collection/handling, submission of specimen other than nasopharyngeal swab, presence of viral mutation(s) within the areas targeted by  this assay, and inadequate number of viral copies(<138 copies/mL). A negative result must be combined with clinical observations, patient history, and epidemiological information. The expected result is Negative.  Fact Sheet for Patients:  BloggerCourse.com  Fact Sheet for Healthcare Providers:  SeriousBroker.it  This test is no t yet approved or cleared by the [place]  FDA and  has been authorized for detection and/or diagnosis of SARS-CoV-2 by FDA under an Emergency Use Authorization (EUA). This EUA will remain  in effect (meaning this test can be used) for the duration of the COVID-19 declaration under Section 564(b)(1) of the Act, 21 U.S.C.section 360bbb-3(b)(1), unless the authorization is terminated  or revoked sooner.       Influenza A by PCR NEGATIVE NEGATIVE Final   Influenza B by PCR NEGATIVE NEGATIVE Final    Comment: (NOTE) The Xpert Xpress SARS-CoV-2/FLU/RSV plus assay is intended as an aid in the diagnosis of influenza from Nasopharyngeal swab specimens and should not be used as a sole basis for treatment. Nasal washings and aspirates are unacceptable for Xpert Xpress SARS-CoV-2/FLU/RSV testing.  Fact Sheet for Patients: BloggerCourse.com  Fact Sheet for Healthcare Providers: SeriousBroker.it  This  test is not yet approved or cleared by the [place]  FDA and has been authorized for detection and/or diagnosis of SARS-CoV-2 by FDA under an Emergency Use Authorization (EUA). This EUA will remain in effect (meaning this test can be used) for the duration of the COVID-19 declaration under Section 564(b)(1) of the Act, 21 U.S.C. section 360bbb-3(b)(1), unless the authorization is terminated or revoked.     Resp Syncytial Virus by PCR NEGATIVE NEGATIVE Final    Comment: (NOTE) Fact Sheet for Patients: BloggerCourse.com  Fact Sheet for Healthcare Providers: SeriousBroker.it  This test is not yet approved or cleared by the [place]  FDA and has been authorized for detection and/or diagnosis of SARS-CoV-2 by FDA under an Emergency Use Authorization (EUA). This EUA will remain in effect (meaning this test can be used) for the duration of the COVID-19 declaration under Section 564(b)(1) of the Act, 21 U.S.C. section 360bbb-3(b)(1), unless the authorization is terminated or revoked.  Performed at [facility], [address], [place], [place] [address]   Respiratory (~20 pathogens) panel by PCR     Status: None   Collection Time: 03/08/24 11:38 AM   Specimen: Nasopharyngeal Swab; Respiratory  Result Value Ref Range Status   Adenovirus NOT DETECTED NOT DETECTED Final   Coronavirus 229E NOT DETECTED NOT DETECTED Final    Comment: (NOTE) The Coronavirus on the Respiratory Panel, DOES NOT test for the novel  Coronavirus (2019 nCoV)    Coronavirus HKU1 NOT DETECTED NOT DETECTED Final   Coronavirus NL63 NOT DETECTED NOT DETECTED Final   Coronavirus OC43 NOT DETECTED NOT DETECTED Final   Metapneumovirus NOT DETECTED NOT DETECTED Final   Rhinovirus / Enterovirus NOT DETECTED NOT DETECTED Final   Influenza A NOT DETECTED NOT DETECTED Final   Influenza B NOT DETECTED NOT DETECTED Final   Parainfluenza Virus 1 NOT  DETECTED NOT DETECTED Final   Parainfluenza Virus 2 NOT DETECTED NOT DETECTED Final   Parainfluenza Virus 3 NOT DETECTED NOT DETECTED Final   Parainfluenza Virus 4 NOT DETECTED NOT DETECTED Final   Respiratory Syncytial Virus NOT DETECTED NOT DETECTED Final   Bordetella pertussis NOT DETECTED NOT DETECTED Final   Bordetella Parapertussis NOT DETECTED NOT DETECTED Final   Chlamydophila pneumoniae NOT DETECTED NOT DETECTED Final   Mycoplasma pneumoniae NOT DETECTED NOT DETECTED Final  Comment: Performed at [facility], [address] [place], [place] [address]  Blood Culture (routine x 2)     Status: None   Collection Time: 03/08/24  1:34 PM   Specimen: BLOOD LEFT ARM  Result Value Ref Range Status   Specimen Description   Final    BLOOD LEFT ARM Performed at [facility], [address], [place], [place] [address]    Special Requests   Final    BOTTLES DRAWN AEROBIC ONLY Blood Culture results may not be optimal due to an inadequate volume of blood received in culture bottles Performed at [facility], [address], [place], [place] [address]    Culture   Final    NO GROWTH 5 DAYS Performed at [facility], [address], [place], [place] [address]    Report Status 03/13/2024 FINAL  Final  Blood Culture (routine x 2)     Status: None   Collection Time: 03/08/24  1:58 PM   Specimen: BLOOD LEFT ARM  Result Value Ref Range Status   Specimen Description   Final    BLOOD LEFT ARM Performed at [facility], [address] [place], [place] [address]    Special Requests   Final    BOTTLES DRAWN AEROBIC AND ANAEROBIC Blood Culture adequate volume Performed at [facility], [address], [place], [place] [address]    Culture   Final    NO GROWTH 5 DAYS Performed at [facility], [address] [place], [place] [address]    Report Status 03/13/2024 FINAL  Final  MRSA Next Gen by PCR, Nasal     Status: None    Collection Time: 03/10/24 10:53 AM   Specimen: Nasal Mucosa; Nasal Swab  Result Value Ref Range Status   MRSA by PCR Next Gen NOT DETECTED NOT DETECTED Final    Comment: (NOTE) The GeneXpert MRSA Assay (FDA approved for NASAL specimens only), is one component of a comprehensive MRSA colonization surveillance program. It is not intended to diagnose MRSA infection nor to guide or monitor treatment for MRSA infections. Test performance is not FDA approved in patients less than 69 years old. Performed at [facility], [address], [place], [place] [address]    *Note: Due to a large number of results and/or encounters for the requested time period, some results have not been displayed. A complete set of results can be found in Results Review.    Labs: CBC: Recent Labs  Lab 03/15/24 0308 03/16/24 0401 03/19/24 0430  WBC 3.7* 1.8* 2.8*  NEUTROABS  --   --  0.7*  HGB 10.2* 10.1* 11.0*  HCT 32.6* 32.1* 36.0  MCV 103.8* 104.2* 104.7*  PLT 101* 88* 93*   Basic Metabolic Panel: Recent Labs  Lab 03/14/24 0310 03/15/24 0308 03/16/24 0401 03/19/24 0430  NA 142 140 137 136  K 3.5 3.3* 4.4 4.7  CL 94* 91* 94* 95*  CO2 33* 35* 32 28  GLUCOSE 133* 121* 114* 99  BUN 24* 28* 33* 40*  CREATININE 0.74 0.79 0.93 0.95  CALCIUM  9.0 9.2 9.3 9.6  MG  --  2.2 2.5* 2.8*  PHOS  --   --  3.0  --    Liver Function Tests: No results for input(s): AST, ALT, ALKPHOS, BILITOT, PROT, ALBUMIN in the last 168 hours. CBG: Recent Labs  Lab 03/19/24 1152 03/19/24 1657 03/19/24 2040 03/20/24 0743 03/20/24 1111  GLUCAP 152* 120* 174* 119* 143*    Discharge time spent: greater than 30 minutes.  Signed: Nydia Distance, MD [facility] 03/20/2024

## 2024-03-20 NOTE — Consult Note (Addendum)
 Urology Consult Note   Requesting Attending Physician:  Davia Nydia POUR, MD Service Providing Consult: Urology  Consulting Attending: Dr. Selma   Reason for Consult:  urinary retention  HPI: Jean Davidson is seen in consultation for reasons noted above at the request of Rai, Nydia POUR, MD. Patient is a 78 y.o. female presenting to [facility] for shortness of breath.  She was admitted and ultimately treated for sepsis secondary to multifocal pneumonia.  PMH significant for IgA lambda multiple myeloma, GERD, HTN, macular degeneration, cervical spinal stenosis, and bladder prolapse.  Patient reports intermittent difficulty with urinary retention secondary to a traumatic forcep delivery followed by multiple bladder slings and patient reports requiring pessary for many years.  Her previous care was in [place] .  She is not presently being followed by urogynecologist.  During this hospitalization she was catheterized due to urinary retention and Foley was removed on 9/24.  Patient reports urinary retention and discomfort.  However, PVRs were only scanned at 312 mL.   On my arrival patient was alert, oriented, no distress.  She was up in chair.  She was a detailed historian.  She has requested to have her catheter replaced and would like to establish with a local urologist.    ------------------  Assessment:   78 y.o. female with multiple bladder slings, self reported to have previously used pessary for bladder prolapse.    Recommendations: # Urinary retention # Self-reported bladder prolapse  Foley catheter has been replaced at patient's request.  We discussed my reservations with her having an indwelling Foley, considering her postvoid residuals were not that high.  She understands the risk of frequent UTIs and will follow-up with us  closely for voiding trial and to establish care.  I reviewed her case briefly with Dr. Cam who will discuss surgical interventions with her on  an outpatient basis.  Patient planning to discharge this afternoon per primary team.  Please feel free to call with questions.  Patient has been provided with our contact information.  Case and plan discussed with Dr. Selma and Dr. 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   Osteopenia    Spinal stenosis in cervical region    Varicose veins of lower extremities with inflammation     Past Surgical History:  Past Surgical History:  Procedure Laterality Date   ANKLE SURGERY Left    BLADDER SUSPENSION  1988/1989   x2   COLONOSCOPY     ESOPHAGOGASTRODUODENOSCOPY     IR IMAGING GUIDED PORT INSERTION  03/01/2018   LIMBAL STEM CELL TRANSPLANT     x2   PORT-A-CATH REMOVAL     SHOULDER SURGERY Right 04/06/2013   right   UPPER GASTROINTESTINAL ENDOSCOPY      Medication: Current Facility-Administered Medications  Medication Dose Route Frequency Provider Last Rate Last Admin   acetaminophen  (  TYLENOL ) tablet 650 mg  650 mg Oral Q6H PRN Foust, Katy L, NP       Or   acetaminophen  (TYLENOL ) suppository 650 mg  650 mg Rectal Q6H PRN Foust, Katy L, NP       acyclovir  (ZOVIRAX ) tablet 400 mg  400 mg Oral BID Foust, Katy L, NP   400 mg at 03/20/24 0912   antiseptic oral rinse (BIOTENE) solution 15 mL  15 mL Mouth Rinse PRN Daniels, James K, NP       aspirin  EC tablet 81 mg  81 mg Oral Daily Foust, Katy L, NP   81 mg at 03/20/24 9087   atovaquone  (MEPRON ) 750 MG/5ML suspension  1,500 mg  1,500 mg Oral BID Patsy Lenis, MD   1,500 mg at 03/20/24 9087   calcium  carbonate (TUMS - dosed in mg elemental calcium ) chewable tablet 400 mg of elemental calcium   2 tablet Oral TID PRN Patsy Lenis, MD   400 mg of elemental calcium  at 03/12/24 1158   Chlorhexidine  Gluconate Cloth 2 % PADS 6 each  6 each Topical Daily Patsy Lenis, MD   6 each at 03/20/24 0913   cholecalciferol  (VITAMIN D3) 25 MCG (1000 UNIT) tablet 4,000 Units  4,000 Units Oral Daily Foust, Katy L, NP   4,000 Units at 03/20/24 9087   diphenoxylate -atropine  (LOMOTIL ) 2.5-0.025 MG per tablet 1 tablet  1 tablet Oral QID PRN Patsy Lenis, MD       furosemide  (LASIX ) tablet 80 mg  80 mg Oral Daily Hilty, Kenneth C, MD   80 mg at 03/20/24 9087   guaiFENesin -dextromethorphan  (ROBITUSSIN DM) 100-10 MG/5ML syrup 5 mL  5 mL Oral Q4H PRN Patsy Lenis, MD       hydrALAZINE  (APRESOLINE ) tablet 25 mg  25 mg Oral Q4H PRN Patsy Lenis, MD       insulin  aspart (novoLOG ) injection 0-6 Units  0-6 Units Subcutaneous TID AC & HS Patsy Lenis, MD   1 Units at 03/19/24 2109   labetalol  (NORMODYNE ) injection 10 mg  10 mg Intravenous Q4H PRN Patsy Lenis, MD       levalbuterol  (XOPENEX ) nebulizer solution 0.63 mg  0.63 mg Nebulization Q4H PRN Foust, Katy L, NP   0.63 mg at 03/08/24 2018   loperamide  (IMODIUM ) capsule 2 mg  2 mg Oral Q4H PRN Patsy Lenis, MD   2 mg at 03/17/24 1942   melatonin tablet 5 mg  5 mg Oral QHS PRN Foust, Katy L, NP   5 mg at 03/09/24 2138   metoprolol  succinate (TOPROL -XL) 24 hr tablet 25 mg  25 mg Oral Daily Goodrich, Callie E, PA-C   25 mg at 03/20/24 9087   metoprolol  tartrate (LOPRESSOR ) injection 5 mg  5 mg Intravenous Q2H PRN Patsy Lenis, MD   5 mg at 03/10/24 1155   mirtazapine  (REMERON ) tablet 15 mg  15 mg Oral QHS Girguis, David, MD   15 mg at 03/19/24 2023   morphine  (PF) 2 MG/ML injection 2 mg  2 mg Intravenous Q2H PRN Patsy Lenis, MD   2 mg at 03/09/24 1019   multivitamin (PROSIGHT)  tablet 2 tablet  2 tablet Oral BID Patsy Lenis, MD   2 tablet at 03/20/24 0911   Muscle Rub CREA 1 Application  1 Application Topical PRN Chavez, Abigail, NP   1 Application at 03/13/24 2204   ondansetron  (ZOFRAN ) tablet 4 mg  4 mg Oral Q6H PRN Foust, Katy L, NP   4 mg at 03/14/24 1945  Or   ondansetron  (ZOFRAN ) injection 4 mg  4 mg Intravenous Q6H PRN Foust, Katy L, NP   4 mg at 03/15/24 1251   Oral care mouth rinse  15 mL Mouth Rinse PRN Patsy Lenis, MD       pantoprazole  (PROTONIX ) EC tablet 40 mg  40 mg Oral Daily Foust, Katy L, NP   40 mg at 03/20/24 0911   polyethylene glycol (MIRALAX  / GLYCOLAX ) packet 17 g  17 g Oral Daily PRN Foust, Katy L, NP       saccharomyces boulardii (FLORASTOR) capsule 250 mg  250 mg Oral BID Daniels, James K, NP   250 mg at 03/20/24 9088   sodium chloride  flush (NS) 0.9 % injection 10-40 mL  10-40 mL Intracatheter PRN Patsy Lenis, MD       tamsulosin  (FLOMAX ) capsule 0.4 mg  0.4 mg Oral Daily Rai, Ripudeep K, MD   0.4 mg at 03/20/24 0911    Allergies: No Known Allergies  Social History: Social History   Tobacco Use   Smoking status: Former    Current packs/day: 0.00    Average packs/day: 0.5 packs/day for 4.0 years (2.0 ttl pk-yrs)    Types: Cigarettes    Start date: 06/26/1964    Quit date: 06/26/1968    Years since quitting: 55.7   Smokeless tobacco: Never  Vaping Use   Vaping status: Never Used  Substance Use Topics   Alcohol  use: Yes    Alcohol /week: 1.0 standard drink of alcohol     Types: 1 Cans of beer per week    Comment: 1 per week per pt   Drug use: No    Family History Family History  Problem Relation Age of Onset   Heart disease Mother    Heart failure Father    Heart disease Father    Throat cancer Father    Skin cancer Father    Diabetes Father    Kidney disease Father    Hypertension Brother    Heart disease Brother        congenital shunt, ?    Colon cancer Paternal Aunt 54   Skin cancer Brother    Stomach  cancer Neg Hx    Esophageal cancer Neg Hx    Rectal cancer Neg Hx     Review of Systems  Genitourinary:  Positive for frequency and urgency. Negative for dysuria, flank pain and hematuria.     Objective   Vital signs in last 24 hours: BP 127/68   Pulse 87   Temp (!) 97.5 F (36.4 C) (Oral)   Resp 18   Ht 5' 2 (1.575 m)   Wt 70.9 kg   SpO2 100%   BMI 28.59 kg/m   Physical Exam General: A&O, resting, appropriate HEENT: [place]/AT Pulmonary: Normal work of breathing Cardiovascular: no cyanosis Abdomen: Soft, NTTP, nondistended GU: foley catheter in place   Most Recent Labs: Lab Results  Component Value Date   WBC 2.8 (L) 03/19/2024   HGB 11.0 (L) 03/19/2024   HCT 36.0 03/19/2024   PLT 93 (L) 03/19/2024    Lab Results  Component Value Date   NA 136 03/19/2024   K 4.7 03/19/2024   CL 95 (L) 03/19/2024   CO2 28 03/19/2024   BUN 40 (H) 03/19/2024   CREATININE 0.95 03/19/2024   CALCIUM  9.6 03/19/2024   MG 2.8 (H) 03/19/2024   PHOS 3.0 03/16/2024    Lab Results  Component Value Date   INR 1.1 03/08/2024  APTT 51 (H) 08/04/2015     Urine Culture: @[id number] (laburin,org,[id number],[id number])@   IMAGING: No results found.  ------  Ole Bourdon, NP Pager: [phone]   Please contact the urology consult pager with any further questions/concerns.  I have seen and examined the patient and agree with the above assessment and plan.  Agree with above. Foley replaced. She will f/u with Dr. Cam for her prolapse. Please call with questions.  Matt R. Aleenah Homen MD [facility]  Pager: [phone]

## 2024-03-21 ENCOUNTER — Other Ambulatory Visit: Payer: Self-pay

## 2024-03-21 NOTE — Progress Notes (Signed)
 Pt discharge at this time via [facility] in no acute episodes. VS  stable.

## 2024-03-24 ENCOUNTER — Telehealth: Payer: Self-pay | Admitting: *Deleted

## 2024-03-24 ENCOUNTER — Encounter: Payer: Self-pay | Admitting: Hematology and Oncology

## 2024-03-24 NOTE — Telephone Encounter (Signed)
 Received call from pt to confirm her appts for this week. Reviewed upcoming appts with her. She was discharged form [facility] last Friday and is now in rehab @ [facility] in [place] She states that the facility will transport her back and forth from facility to [facility] and back again to facility. She states she is currently on 02 and has a foley catheter. She does have an appt to [facility] on 04/01/24  Mallie Hum, Rph made aware of the above.
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
## 2024-03-26 ENCOUNTER — Other Ambulatory Visit

## 2024-03-26 ENCOUNTER — Inpatient Hospital Stay

## 2024-03-26 ENCOUNTER — Ambulatory Visit: Admitting: Physician Assistant

## 2024-03-26 ENCOUNTER — Inpatient Hospital Stay (HOSPITAL_BASED_OUTPATIENT_CLINIC_OR_DEPARTMENT_OTHER): Admitting: Physician Assistant

## 2024-03-26 ENCOUNTER — Inpatient Hospital Stay: Attending: Hematology and Oncology

## 2024-03-26 ENCOUNTER — Ambulatory Visit

## 2024-03-26 ENCOUNTER — Encounter: Admitting: Dietician

## 2024-03-26 DIAGNOSIS — C9002 Multiple myeloma in relapse: Secondary | ICD-10-CM | POA: Insufficient documentation

## 2024-03-26 DIAGNOSIS — E876 Hypokalemia: Secondary | ICD-10-CM | POA: Diagnosis not present

## 2024-03-26 DIAGNOSIS — Z9484 Stem cells transplant status: Secondary | ICD-10-CM | POA: Insufficient documentation

## 2024-03-26 DIAGNOSIS — C9001 Multiple myeloma in remission: Secondary | ICD-10-CM

## 2024-03-26 DIAGNOSIS — Z87891 Personal history of nicotine dependence: Secondary | ICD-10-CM | POA: Insufficient documentation

## 2024-03-26 DIAGNOSIS — D708 Other neutropenia: Secondary | ICD-10-CM

## 2024-03-26 LAB — CBC WITH DIFFERENTIAL (CANCER CENTER ONLY)
Abs Immature Granulocytes: 0.02 K/uL (ref 0.00–0.07)
Basophils Absolute: 0 K/uL (ref 0.0–0.1)
Basophils Relative: 0 %
Eosinophils Absolute: 0 K/uL (ref 0.0–0.5)
Eosinophils Relative: 0 %
HCT: 32.7 % — ABNORMAL LOW (ref 36.0–46.0)
Hemoglobin: 11.3 g/dL — ABNORMAL LOW (ref 12.0–15.0)
Immature Granulocytes: 1 %
Lymphocytes Relative: 49 %
Lymphs Abs: 2.1 K/uL (ref 0.7–4.0)
MCH: 33.1 pg (ref 26.0–34.0)
MCHC: 34.6 g/dL (ref 30.0–36.0)
MCV: 95.9 fL (ref 80.0–100.0)
Monocytes Absolute: 0.7 K/uL (ref 0.1–1.0)
Monocytes Relative: 17 %
Neutro Abs: 1.4 K/uL — ABNORMAL LOW (ref 1.7–7.7)
Neutrophils Relative %: 33 %
Platelet Count: 127 K/uL — ABNORMAL LOW (ref 150–400)
RBC: 3.41 MIL/uL — ABNORMAL LOW (ref 3.87–5.11)
RDW: 15.1 % (ref 11.5–15.5)
WBC Count: 4.3 K/uL (ref 4.0–10.5)
nRBC: 0 % (ref 0.0–0.2)

## 2024-03-26 LAB — CMP (CANCER CENTER ONLY)
ALT: 13 U/L (ref 0–44)
AST: 22 U/L (ref 15–41)
Albumin: 4.3 g/dL (ref 3.5–5.0)
Alkaline Phosphatase: 77 U/L (ref 38–126)
Anion gap: 11 (ref 5–15)
BUN: 31 mg/dL — ABNORMAL HIGH (ref 8–23)
CO2: 35 mmol/L — ABNORMAL HIGH (ref 22–32)
Calcium: 9.7 mg/dL (ref 8.9–10.3)
Chloride: 90 mmol/L — ABNORMAL LOW (ref 98–111)
Creatinine: 1.08 mg/dL — ABNORMAL HIGH (ref 0.44–1.00)
GFR, Estimated: 53 mL/min — ABNORMAL LOW (ref >60)
Glucose, Bld: 122 mg/dL — ABNORMAL HIGH (ref 70–99)
Potassium: 3.1 mmol/L — ABNORMAL LOW (ref 3.5–5.1)
Sodium: 136 mmol/L (ref 135–145)
Total Bilirubin: 0.6 mg/dL (ref 0.0–1.2)
Total Protein: 7 g/dL (ref 6.5–8.1)

## 2024-03-26 LAB — MAGNESIUM: Magnesium: 2.2 mg/dL (ref 1.7–2.4)

## 2024-03-26 NOTE — Progress Notes (Signed)
 ANC 1.4. Treatment held today. Pt to see Dr. Federico in one week to be reevaluated for treatment at that time

## 2024-03-27 LAB — KAPPA/LAMBDA LIGHT CHAINS
Kappa free light chain: <0.7 mg/L — ABNORMAL LOW (ref 3.3–19.4)
Kappa, lambda light chain ratio: UNDETERMINED
Lambda free light chains: <1.5 mg/L — ABNORMAL LOW (ref 5.7–26.3)

## 2024-03-28 ENCOUNTER — Telehealth: Payer: Self-pay | Admitting: *Deleted

## 2024-03-28 NOTE — Telephone Encounter (Signed)
 Received call from pt. She states she tested + for covid and is asking to be admitted to the hospital. She currently is in rehab s/p hospital discharge. Pt states she remains off her 02. Denies SOB. She has a non-productive cough-though she has had this since discharge. Denies fever/chills. Advised that we cannot admit her to the hospital. Any transfers like that would need to come from the facility. Advised that she discuss her covid concerns with the provider there. They potentially could start her on Paxlovid and treat her cough. Pt voiced understanding.

## 2024-03-28 NOTE — Telephone Encounter (Signed)
 TCT patient regarding appt time change on 04/02/24. Due to her currwent Covid status, we have had to change her appts to 1pm that day and all her appts will be done in one of the separate rooms in infusion-labs, MD and her treatment. Advised that pt needs to call from her vehicle so that a nurse can come to take her directly to the infusion area and her separate room. Pt voiced understanding.

## 2024-03-30 NOTE — Progress Notes (Unsigned)
 Cardiology Office Note:    Date:  03/30/2024   ID:  [name], DOB 09-Oct-1945, MRN [id number]  PCP:  Patient, [name]   [facility] Providers Cardiologist:  Deatrice Cage, MD { Click to update primary MD,subspecialty MD or APP then REFRESH:1}    Referring MD: No ref. provider found   No chief complaint on file. ***  History of Present Illness:    Jean Davidson is a 78 y.o. female with a hx of hypertension, paroxysmal SVT, remote large pericardial effusion resolved without intervention, mild carotic stenosis, hypertension, GERD, chronic anemia, relapsed/refractory multiple myeloma on Elrexfio .  She was initially referred to cardiology in 2016 for palpitations with a Holter monitor showing NSR with infrequent PVCs/PACs.  POET at that time was normal.  Repeat heart monitor in 2020 for increasing palpitations showed multiple runs of SVT.  She was transition from carvedilol  to metoprolol  and did well.  Echocardiogram in 2017 with large pericardial effusion managed by [facility] cardiology, effusion resolved without intervention.  She was recently admitted 02/2024 with severe sepsis and acute hypoxic respiratory failure secondary to pneumonia.  Echocardiogram demonstrated LVEF 35-40% with no RWMA, grade 1 DD, normal RV, moderate MR.  She was diuresed and discharged on Lasix  80 mg daily, 25 mg Toprol .  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  Due to her strong family history of CAD, decision was made to defer ischemic evaluation in the setting of severe illness and revisit possibly CT coronary once recovered.      Chronic systolic heart failure Moderate MR -LVEF 35-40% with moderate MR-03/11/2024 - GDMT: 25 mg Toprol  -I do not see p.o. 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   Osteopenia    Spinal stenosis in cervical region    Varicose veins of lower extremities with inflammation     Past Surgical History:  Procedure Laterality Date   ANKLE SURGERY Left    BLADDER SUSPENSION  1988/1989   x2   COLONOSCOPY     ESOPHAGOGASTRODUODENOSCOPY     IR IMAGING GUIDED PORT INSERTION  03/01/2018   LIMBAL STEM CELL TRANSPLANT     x2   PORT-A-CATH REMOVAL     SHOULDER SURGERY Right 04/06/2013   right   UPPER GASTROINTESTINAL ENDOSCOPY      Current Medications: No outpatient medications have been marked as taking for the 04/04/24 encounter (Appointment) with Madie Jon Garre, PA.     Allergies:   Patient has no known allergies.   Social History   Socioeconomic History   Marital status: Divorced    Spouse name: Not on file  Number of children: Not on file   Years of education: Not on file   Highest education level: Not on file  Occupational History   Not on file  Tobacco Use   Smoking status: Former    Current packs/day: 0.00    Average packs/day: 0.5 packs/day for 4.0 years (2.0 ttl pk-yrs)    Types: Cigarettes    Start date: 06/26/1964    Quit date: 06/26/1968    Years since quitting: 55.7   Smokeless tobacco: Never  Vaping Use   Vaping status: Never Used  Substance and Sexual Activity   Alcohol  use: Yes    Alcohol /week: 1.0 standard drink of alcohol     Types: 1 Cans of beer per week     Comment: 1 per week per pt   Drug use: No   Sexual activity: Not on file  Other Topics Concern   Not on file  Social History Narrative   Jean Davidson is daughter navreet, bolda will,  full code ( reviewed 63)   Social Drivers of Health   Financial Resource Strain: Low Risk  (10/09/2023)   Received from [facility]   Overall Financial Resource Strain (CARDIA)    Difficulty of Paying Living Expenses: Not hard at all  Food Insecurity: No Food Insecurity (03/09/2024)   Hunger Vital Sign    Worried About Running Out of Food in the Last Year: Never true    Ran Out of Food in the Last Year: Never true  Transportation Needs: No Transportation Needs (03/09/2024)   PRAPARE - Administrator, Civil Service (Medical): No    Lack of Transportation (Non-Medical): No  Physical Activity: Not on file  Stress: Not on file  Social Connections: Moderately Integrated (03/09/2024)   Social Connection and Isolation Panel    Frequency of Communication with Friends and Family: More than three times a week    Frequency of Social Gatherings with Friends and Family: Twice a week    Attends Religious Services: 1 to 4 times per year    Active Member of [employer] or Organizations: Yes    Attends Engineer, structural: 1 to 4 times per year    Marital Status: Divorced     Family History: The patient's ***family history includes Colon cancer (age of onset: 36) in her paternal aunt; Diabetes in her father; Heart disease in her brother, father, and mother; Heart failure in her father; Hypertension in her brother; Kidney disease in her father; Skin cancer in her brother and father; Throat cancer in her father. There is no history of Stomach cancer, Esophageal cancer, or Rectal cancer.  ROS:   Please see the history of present illness.    *** All other systems reviewed and are negative.  EKGs/Labs/Other Studies Reviewed:    The following studies were reviewed today: ***       Recent Labs: 03/10/2024: Pro Brain Natriuretic Peptide 16,972.0 03/26/2024: ALT 13; BUN 31; Creatinine 1.08; Hemoglobin 11.3; Magnesium  2.2; Platelet Count 127; Potassium 3.1; Sodium 136  Recent Lipid Panel    Component Value Date/Time   CHOL 110 08/08/2013 1127   TRIG 82.0 08/08/2013 1127   HDL 36.50 (L) 08/08/2013 1127   CHOLHDL 3 08/08/2013 1127   VLDL 16.4 08/08/2013 1127   LDLCALC 57 08/08/2013 1127     Risk Assessment/Calculations:   {Does this patient have ATRIAL FIBRILLATION?[phone]}  No BP recorded.  {Refresh Note OR Click here to enter BP  :1}***  Physical Exam:    VS:  There were no vitals taken for this visit.    Wt Readings from Last 3 Encounters:  03/26/24 148 lb 2 oz (67.2 kg)  03/18/24 156 lb 4.9 oz (70.9 kg)  03/05/24 159 lb (72.1 kg)     GEN: *** Well nourished, well developed in no acute distress HEENT: Normal NECK: No JVD; No carotid bruits LYMPHATICS: No lymphadenopathy CARDIAC: ***RRR, no murmurs, rubs, gallops RESPIRATORY:  Clear to auscultation without rales, wheezing or rhonchi  ABDOMEN: Soft, non-tender, non-distended MUSCULOSKELETAL:  No edema; No deformity  SKIN: Warm and dry NEUROLOGIC:  Alert and oriented x 3 PSYCHIATRIC:  Normal affect   ASSESSMENT:    No diagnosis found. PLAN:    In order of problems listed above:  ***      {Are you ordering a CV Procedure (e.g. stress test, cath, DCCV, TEE, etc)?   Press F2        :[id number]}    Medication Adjustments/Labs and Tests Ordered: Current medicines are reviewed at length with the patient today.  Concerns regarding medicines are outlined above.  No orders of the defined types were placed in this encounter.  No orders of the defined types were placed in this encounter.   There are no Patient Instructions on file for this visit.   Signed, Jon Nat Hails, PA  03/30/2024 8:44 AM    [facility]

## 2024-03-31 ENCOUNTER — Other Ambulatory Visit: Payer: Self-pay

## 2024-03-31 LAB — MULTIPLE MYELOMA PANEL, SERUM
Albumin SerPl Elph-Mcnc: 3.4 g/dL (ref 2.9–4.4)
Albumin/Glob SerPl: 1.2 (ref 0.7–1.7)
Alpha 1: 0.4 g/dL (ref 0.0–0.4)
Alpha2 Glob SerPl Elph-Mcnc: 1 g/dL (ref 0.4–1.0)
B-Globulin SerPl Elph-Mcnc: 1.1 g/dL (ref 0.7–1.3)
Gamma Glob SerPl Elph-Mcnc: 0.6 g/dL (ref 0.4–1.8)
Globulin, Total: 3 g/dL (ref 2.2–3.9)
IgA: <5 mg/dL — ABNORMAL LOW (ref 64–422)
IgG (Immunoglobin G), Serum: 725 mg/dL (ref 586–1602)
IgM (Immunoglobulin M), Srm: <5 mg/dL — ABNORMAL LOW (ref 26–217)
Total Protein ELP: 6.4 g/dL (ref 6.0–8.5)

## 2024-04-01 ENCOUNTER — Ambulatory Visit: Admitting: Medical

## 2024-04-01 ENCOUNTER — Telehealth: Payer: Self-pay | Admitting: *Deleted

## 2024-04-01 ENCOUNTER — Other Ambulatory Visit: Payer: Self-pay

## 2024-04-01 NOTE — Telephone Encounter (Signed)
 TCT patient to inquire about current symptoms with upcoming appts at [facility] on 04/02/24. She reports she started Paxlovid on Saturday and will take last pill today. She states she has no chest congestion and no temperature and feels much better.   Advised her that due to her current Covid diagnosis, all her appts will be done in one room in infusion area. She stated she had been informed of this. Reminded her that she needs to call while still in vehicle so that a nurse can come to take her directly to the infusion area and her separate room. Gave her number for infusion and and advised her she can also inform staff at [facility] entrance and they can contact infusion for her. Pt voiced understanding of all information.

## 2024-04-02 ENCOUNTER — Inpatient Hospital Stay

## 2024-04-02 ENCOUNTER — Telehealth: Payer: Self-pay

## 2024-04-02 ENCOUNTER — Ambulatory Visit: Admitting: Hematology and Oncology

## 2024-04-02 ENCOUNTER — Other Ambulatory Visit: Payer: Self-pay

## 2024-04-02 ENCOUNTER — Other Ambulatory Visit

## 2024-04-02 ENCOUNTER — Inpatient Hospital Stay: Admitting: Physician Assistant

## 2024-04-02 ENCOUNTER — Encounter: Payer: Self-pay | Admitting: Hematology and Oncology

## 2024-04-02 VITALS — BP 131/70 | HR 92 | Temp 99.6°F | Resp 18

## 2024-04-02 DIAGNOSIS — C9001 Multiple myeloma in remission: Secondary | ICD-10-CM

## 2024-04-02 DIAGNOSIS — C9002 Multiple myeloma in relapse: Secondary | ICD-10-CM | POA: Diagnosis not present

## 2024-04-02 LAB — CMP (CANCER CENTER ONLY)
ALT: 18 U/L (ref 0–44)
AST: 25 U/L (ref 15–41)
Albumin: 4.1 g/dL (ref 3.5–5.0)
Alkaline Phosphatase: 74 U/L (ref 38–126)
Anion gap: 9 (ref 5–15)
BUN: 15 mg/dL (ref 8–23)
CO2: 28 mmol/L (ref 22–32)
Calcium: 9.3 mg/dL (ref 8.9–10.3)
Chloride: 102 mmol/L (ref 98–111)
Creatinine: 1.11 mg/dL — ABNORMAL HIGH (ref 0.44–1.00)
GFR, Estimated: 51 mL/min — ABNORMAL LOW (ref >60)
Glucose, Bld: 103 mg/dL — ABNORMAL HIGH (ref 70–99)
Potassium: 4.2 mmol/L (ref 3.5–5.1)
Sodium: 139 mmol/L (ref 135–145)
Total Bilirubin: 0.4 mg/dL (ref 0.0–1.2)
Total Protein: 7.2 g/dL (ref 6.5–8.1)

## 2024-04-02 LAB — CBC WITH DIFFERENTIAL (CANCER CENTER ONLY)
Abs Immature Granulocytes: 0.04 K/uL (ref 0.00–0.07)
Basophils Absolute: 0 K/uL (ref 0.0–0.1)
Basophils Relative: 1 %
Eosinophils Absolute: 0 K/uL (ref 0.0–0.5)
Eosinophils Relative: 0 %
HCT: 33.7 % — ABNORMAL LOW (ref 36.0–46.0)
Hemoglobin: 11 g/dL — ABNORMAL LOW (ref 12.0–15.0)
Immature Granulocytes: 1 %
Lymphocytes Relative: 46 %
Lymphs Abs: 2 K/uL (ref 0.7–4.0)
MCH: 33.5 pg (ref 26.0–34.0)
MCHC: 32.6 g/dL (ref 30.0–36.0)
MCV: 102.7 fL — ABNORMAL HIGH (ref 80.0–100.0)
Monocytes Absolute: 0.7 K/uL (ref 0.1–1.0)
Monocytes Relative: 18 %
Neutro Abs: 1.4 K/uL — ABNORMAL LOW (ref 1.7–7.7)
Neutrophils Relative %: 34 %
Platelet Count: 107 K/uL — ABNORMAL LOW (ref 150–400)
RBC: 3.28 MIL/uL — ABNORMAL LOW (ref 3.87–5.11)
RDW: 16.2 % — ABNORMAL HIGH (ref 11.5–15.5)
WBC Count: 4.2 K/uL (ref 4.0–10.5)
nRBC: 0 % (ref 0.0–0.2)

## 2024-04-02 LAB — MAGNESIUM: Magnesium: 1.9 mg/dL (ref 1.7–2.4)

## 2024-04-02 MED ORDER — ELRANATAMAB-BCMM 76 MG/1.9ML ~~LOC~~ SOLN
76.0000 mg | Freq: Once | SUBCUTANEOUS | Status: AC
  Administered 2024-04-02: 76 mg via SUBCUTANEOUS
  Filled 2024-04-02: qty 1.9

## 2024-04-02 NOTE — Progress Notes (Signed)
 [facility] Telephone:(336) [phone]   Fax:(336) [phone]  PROGRESS NOTE  Patient Care Team: Patient, [name] as PCP - General (General Practice) Darron Deatrice LABOR, MD as PCP - Cardiology (Cardiology) Darren Liborio Backers, MD as Referring Physician (Hematology and Oncology) Guinevere File, MD as Consulting Physician (Internal Medicine) Fleeta Rothman, Jomarie SAILOR, MD as Consulting Physician (Infectious Diseases) Marda Maranda Foots An, MD as Consulting Physician (Hematology and Oncology) Honora Lenon Irving, NP as Nurse Practitioner (Hematology and Oncology) Federico Norleen ONEIDA MADISON, MD as Consulting Physician (Hematology and Oncology)  Hematological/Oncological History # Relapsed/Refractory IgA Lambda Multiple Myeloma, In Remission 2016: Underwent autologous stem cell transplant 2017: Underwent allogenic stem cell transplant 10/16/2022: Completed CAR-T therapy with [facility] 09/2023: Started on Elrexfio  due to progression of disease 01/02/2024: Last visit at [facility] where induction therapy was completed 01/09/2024: establish care with Dr. Federico. Resumed weekly Elrexfio  therapy.   Interval History:  Jean Davidson 78 y.o. female with medical history significant for relapsed/refractory multiple myeloma who presents for a follow up visit. The patient's last visit was on 03/26/2024 and in the interim since the last visit she was tested positive for COVID infection although she is asymptomatic.  [name] reports her energy levels continue to improve since recent hospitalization.  She will be discharged from her rehab facility tomorrow and return back home.  She is able to ambulate on her own.  She denies nausea, vomiting or bowel habit changes.  She was evaluated by urology recently and the plan is to undergo voiding studies and a follow-up visit with the urologist.  Patient plans to keep the urinary catheter in place in the interim.  She denies easy bruising or signs  of active bleeding.Patient denies fevers, chills, night sweats, shortness of breath, chest pain or cough.  She has no other complaints.  Rest of the 10 point ROS is below.     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   Osteopenia    Spinal stenosis in cervical region    Varicose veins of lower extremities with inflammation     SURGICAL HISTORY: Past Surgical History:  Procedure Laterality Date   ANKLE SURGERY Left    BLADDER SUSPENSION  1988/1989   x2   COLONOSCOPY     ESOPHAGOGASTRODUODENOSCOPY     IR IMAGING GUIDED PORT INSERTION  03/01/2018   LIMBAL STEM CELL TRANSPLANT     x2   PORT-A-CATH REMOVAL     SHOULDER SURGERY Right 04/06/2013   right   UPPER GASTROINTESTINAL ENDOSCOPY      SOCIAL HISTORY: Social History   Socioeconomic History   Marital status: Divorced    Spouse name: Not on file   Number of children: Not on file   Years of education: Not on file   Highest education level: Not on file  Occupational History  Not on file  Tobacco Use   Smoking status: Former    Current packs/day: 0.00    Average packs/day: 0.5 packs/day for 4.0 years (2.0 ttl pk-yrs)    Types: Cigarettes    Start date: 06/26/1964    Quit date: 06/26/1968    Years since quitting: 55.8   Smokeless tobacco: Never  Vaping Use   Vaping status: Never Used  Substance and Sexual Activity   Alcohol  use: Yes    Alcohol /week: 1.0 standard drink of alcohol     Types: 1 Cans of beer per  week    Comment: 1 per week per pt   Drug use: No   Sexual activity: Not on file  Other Topics Concern   Not on file  Social History Narrative   Jean Davidson is daughter leith, hedlund will,  full code ( reviewed 71)   Social Drivers of Health   Financial Resource Strain: Low Risk  (10/09/2023)   Received from [facility]   Overall Financial Resource Strain (CARDIA)    Difficulty of Paying Living Expenses: Not hard at all  Food Insecurity: No Food Insecurity (03/09/2024)   Hunger Vital Sign    Worried About Running Out of Food in the Last Year: Never true    Ran Out of Food in the Last Year: Never true  Transportation Needs: No Transportation Needs (03/09/2024)   PRAPARE - Administrator, Civil Service (Medical): No    Lack of Transportation (Non-Medical): No  Physical Activity: Not on file  Stress: Not on file  Social Connections: Moderately Integrated (03/09/2024)   Social Connection and Isolation Panel    Frequency of Communication with Friends and Family: More than three times a week    Frequency of Social Gatherings with Friends and Family: Twice a week    Attends Religious Services: 1 to 4 times per year    Active Member of [employer] or Organizations: Yes    Attends Banker Meetings: 1 to 4 times per year    Marital Status: Divorced  Catering manager Violence: Not At Risk (03/09/2024)   Humiliation, Afraid, Rape, and Kick questionnaire    Fear of Current or Ex-Partner: No    Emotionally Abused: No    Physically Abused: No    Sexually Abused: No    FAMILY HISTORY: Family History  Problem Relation Age of Onset   Heart disease Mother    Heart failure Father    Heart disease Father    Throat cancer Father    Skin cancer Father    Diabetes Father    Kidney disease Father    Hypertension Brother    Heart disease Brother        congenital shunt, ?    Colon cancer Paternal Aunt 60   Skin cancer Brother    Stomach cancer Neg Hx     Esophageal cancer Neg Hx    Rectal cancer Neg Hx     ALLERGIES:  has no known allergies.  MEDICATIONS:  Current Outpatient Medications  Medication Sig Dispense Refill   acyclovir  (ZOVIRAX ) 400 MG tablet Take 1 tablet (400 mg total) by mouth 2 (two) times daily. 180 tablet 1   aspirin  EC 81 MG tablet Take 81 mg by mouth daily. Swallow whole.     atovaquone  (MEPRON ) 750 MG/5ML suspension TAKE 10 MLS (1,500 MG TOTAL) BY MOUTH 2 (TWO) TIMES DAILY. 420 mL 3   cholecalciferol  (VITAMIN D3) 25 MCG (1000 UNIT) tablet Take 4,000 Units by  mouth daily. Instructed to increase to 4,000 units daily     guaiFENesin -dextromethorphan  (ROBITUSSIN DM) 100-10 MG/5ML syrup Take 5 mLs by mouth every 4 (four) hours as needed for cough (chest congestion). 118 mL 0   melatonin 5 MG TABS Take 1 tablet (5 mg total) by mouth at bedtime as needed.     metoprolol  succinate (TOPROL -XL) 25 MG 24 hr tablet Take 1 tablet (25 mg total) by mouth daily.     mirtazapine  (REMERON ) 15 MG tablet TAKE 1 TABLET BY MOUTH EVERYDAY AT BEDTIME 90 tablet 1   Multiple Vitamins-Minerals (PRESERVISION AREDS 2 PO) Take 1 tablet by mouth 2 (two) times daily.     ondansetron  (ZOFRAN ) 8 MG tablet Take 1 tablet (8 mg total) by mouth every 8 (eight) hours as needed (Nausea or vomiting). (Patient taking differently: Take 8 mg by mouth 2 (two) times daily.) 60 tablet 1   pantoprazole  (PROTONIX ) 40 MG tablet TAKE 1 TABLET BY MOUTH 2 TIMES DAILY. TAKE 30-60 MINUTES BEFORE BREAKFAST AND DINNER. 180 tablet 0   prochlorperazine  (COMPAZINE ) 5 MG tablet Take 5 mg by mouth every 6 (six) hours as needed for nausea or vomiting.     senna-docusate (SENOKOT-S) 8.6-50 MG tablet Take 2 tablets by mouth daily as needed.     No current facility-administered medications for this visit.    REVIEW OF SYSTEMS:   Constitutional: ( - ) fevers, ( - )  chills , ( - ) night sweats Eyes: ( - ) blurriness of vision, ( - ) double vision, ( - ) watery eyes Ears, nose,  mouth, throat, and face: ( - ) mucositis, ( - ) sore throat Respiratory: ( - ) cough, ( - ) dyspnea, ( - ) wheezes Cardiovascular: ( - ) palpitation, ( - ) chest discomfort, ( - ) lower extremity swelling Gastrointestinal:  ( - ) nausea, ( - ) heartburn, ( - ) change in bowel habits Skin: ( - ) abnormal skin rashes Lymphatics: ( - ) new lymphadenopathy, ( - ) easy bruising Neurological: ( - ) numbness, ( - ) tingling, ( - ) new weaknesses Behavioral/Psych: ( - ) mood change, ( - ) new changes  All other systems were reviewed with the patient and are negative.  PHYSICAL EXAMINATION: ECOG PERFORMANCE STATUS: 1 - Symptomatic but completely ambulatory  There were no vitals filed for this visit.  There were no vitals filed for this visit.   Day 8, Cycle 6 04/02/24  Temp 99.6 F (37.6 C)  Temp src Oral  Pulse 92  Resp 18  BP 131/70   GENERAL: Well-appearing elderly Caucasian female, alert, no distress and comfortable SKIN: skin color, texture, turgor are normal, no rashes or significant lesions EYES: conjunctiva are pink and non-injected, sclera clear LUNGS: clear to auscultation and percussion with normal breathing effort HEART: regular rate & rhythm and no murmurs and no lower extremity edema Musculoskeletal: no cyanosis of digits and no clubbing  PSYCH: alert & oriented x 3, fluent speech NEURO: no focal motor/sensory deficits  LABORATORY DATA:  I have reviewed the data as listed    Latest Ref Rng & Units 04/02/2024    1:32 PM 03/26/2024    9:03 AM 03/19/2024    4:30 AM  CBC  WBC 4.0 - 10.5 K/uL 4.2  4.3  2.8   Hemoglobin 12.0 - 15.0 g/dL 88.9  88.6  88.9   Hematocrit 36.0 - 46.0 % 33.7  32.7  36.0   Platelets 150 - 400  K/uL 107  127  93        Latest Ref Rng & Units 04/02/2024    1:32 PM 03/26/2024    9:03 AM 03/19/2024    4:30 AM  CMP  Glucose 70 - 99 mg/dL 896  877  99   BUN 8 - 23 mg/dL 15  31  40   Creatinine 0.44 - 1.00 mg/dL 8.88  8.91  9.04   Sodium 135 - 145  mmol/L 139  136  136   Potassium 3.5 - 5.1 mmol/L 4.2  3.1  4.7   Chloride 98 - 111 mmol/L 102  90  95   CO2 22 - 32 mmol/L 28  35  28   Calcium  8.9 - 10.3 mg/dL 9.3  9.7  9.6   Total Protein 6.5 - 8.1 g/dL 7.2  7.0    Total Bilirubin 0.0 - 1.2 mg/dL 0.4  0.6    Alkaline Phos 38 - 126 U/L 74  77    AST 15 - 41 U/L 25  22    ALT 0 - 44 U/L 18  13      Lab Results  Component Value Date   MPROTEIN Not Observed 03/26/2024   MPROTEIN Not Observed 03/15/2024   MPROTEIN Not Observed 02/27/2024   Lab Results  Component Value Date   KPAFRELGTCHN <0.7 (L) 03/26/2024   KPAFRELGTCHN <0.7 (L) 03/15/2024   KPAFRELGTCHN <0.7 (L) 02/27/2024   LAMBDASER <1.5 (L) 03/26/2024   LAMBDASER <1.5 (L) 03/15/2024   LAMBDASER <1.5 (L) 02/27/2024   KAPLAMBRATIO UNABLE TO CALCULATE 03/26/2024   KAPLAMBRATIO UNABLE TO CALCULATE 03/15/2024   KAPLAMBRATIO UNABLE TO CALCULATE 02/27/2024     RADIOGRAPHIC STUDIES: ECHOCARDIOGRAM COMPLETE Result Date: 03/11/2024    ECHOCARDIOGRAM REPORT   Patient Name:   KESHARA KIGER Date of Exam: 03/11/2024 Medical Rec #:  [id number]      Height:       62.0 in Accession #:    [id number]     Weight:       160.0 lb Date of Birth:  1945-12-11      BSA:          1.739 m Patient Age:    77 years       BP:           94/54 mmHg Patient Gender: F              HR:           90 bpm. Exam Location:  Inpatient Procedure: 2D Echo, Intracardiac Opacification Agent, Cardiac Doppler and Color            Doppler (Both Spectral and Color Flow Doppler were utilized during            procedure). Indications:    CHF-Acute Diastolic I50.31  History:        Patient has prior history of Echocardiogram examinations, most                 recent 04/20/2021. Risk Factors:Hypertension and Former Smoker.  Sonographer:    Koleen Popper RDCS Referring Phys: [id number] PAULA SOUTHERLY IMPRESSIONS  1. Left ventricular ejection fraction, by estimation, is 35 to 40%. The left ventricle has moderately decreased function.  The left ventricle has no regional wall motion abnormalities. Left ventricular diastolic parameters are consistent with Grade I diastolic dysfunction (impaired relaxation).  2. Right ventricular systolic function is normal. The right ventricular size is normal. There is mildly elevated pulmonary artery systolic  pressure.  3. The mitral valve is normal in structure. Moderate mitral valve regurgitation. No evidence of mitral stenosis.  4. The aortic valve is normal in structure. Aortic valve regurgitation is not visualized. No aortic stenosis is present.  5. The inferior vena cava is normal in size with greater than 50% respiratory variability, suggesting right atrial pressure of 3 mmHg. FINDINGS  Left Ventricle: Left ventricular ejection fraction, by estimation, is 35 to 40%. The left ventricle has moderately decreased function. The left ventricle has no regional wall motion abnormalities. Definity  contrast agent was given IV to delineate the left ventricular endocardial borders. The left ventricular internal cavity size was normal in size. There is no left ventricular hypertrophy. Left ventricular diastolic parameters are consistent with Grade I diastolic dysfunction (impaired relaxation).  LV Wall Scoring: The apex is akinetic. The apical lateral segment, mid inferoseptal segment, apical septal segment, apical anterior segment, and apical inferior segment are hypokinetic. Right Ventricle: The right ventricular size is normal. No increase in right ventricular wall thickness. Right ventricular systolic function is normal. There is mildly elevated pulmonary artery systolic pressure. The tricuspid regurgitant velocity is 3.03  m/s, and with an assumed right atrial pressure of 8 mmHg, the estimated right ventricular systolic pressure is 44.7 mmHg. Left Atrium: Left atrial size was normal in size. Right Atrium: Right atrial size was normal in size. Pericardium: There is no evidence of pericardial effusion. Mitral Valve:  The mitral valve is normal in structure. Mild mitral annular calcification. Moderate mitral valve regurgitation. No evidence of mitral valve stenosis. MV peak gradient, 6.2 mmHg. The mean mitral valve gradient is 3.0 mmHg. Tricuspid Valve: The tricuspid valve is normal in structure. Tricuspid valve regurgitation is mild . No evidence of tricuspid stenosis. Aortic Valve: The aortic valve is normal in structure. Aortic valve regurgitation is not visualized. No aortic stenosis is present. Pulmonic Valve: The pulmonic valve was normal in structure. Pulmonic valve regurgitation is trivial. No evidence of pulmonic stenosis. Aorta: The aortic root is normal in size and structure. Venous: The inferior vena cava is normal in size with greater than 50% respiratory variability, suggesting right atrial pressure of 3 mmHg. IAS/Shunts: No atrial level shunt detected by color flow Doppler.  LEFT VENTRICLE PLAX 2D LVIDd:         4.20 cm   Diastology LVIDs:         3.10 cm   LV e' medial:    6.53 cm/s LV PW:         0.80 cm   LV E/e' medial:  15.2 LV IVS:        1.10 cm   LV e' lateral:   6.74 cm/s LVOT diam:     1.80 cm   LV E/e' lateral: 14.8 LV SV:         45 LV SV Index:   26 LVOT Area:     2.54 cm  RIGHT VENTRICLE             IVC RV S prime:     15.20 cm/s  IVC diam: 2.40 cm TAPSE (M-mode): 2.4 cm LEFT ATRIUM           Index        RIGHT ATRIUM           Index LA diam:      4.00 cm 2.30 cm/m   RA Area:     10.90 cm LA Vol (A2C): 36.0 ml 20.70 ml/m  RA Volume:   24.60  ml  14.15 ml/m LA Vol (A4C): 52.6 ml 30.25 ml/m  AORTIC VALVE LVOT Vmax:   102.00 cm/s LVOT Vmean:  70.600 cm/s LVOT VTI:    0.176 m  AORTA Ao Root diam: 2.80 cm Ao Asc diam:  3.70 cm MITRAL VALVE                  TRICUSPID VALVE MV Area (PHT): 7.16 cm       TR Peak grad:   36.7 mmHg MV Area VTI:   1.89 cm       TR Vmax:        303.00 cm/s MV Peak grad:  6.2 mmHg MV Mean grad:  3.0 mmHg       SHUNTS MV Vmax:       1.25 m/s       Systemic VTI:  0.18 m MV  Vmean:      81.2 cm/s      Systemic Diam: 1.80 cm MV Decel Time: 106 msec MR Peak grad:    108.8 mmHg MR Mean grad:    79.0 mmHg MR Vmax:         521.50 cm/s MR Vmean:        427.0 cm/s MR PISA:         1.01 cm MR PISA Eff ROA: 7 mm MR PISA Radius:  0.40 cm MV E velocity: 99.50 cm/s MV A velocity: 78.80 cm/s MV E/A ratio:  1.26 Aditya Sabharwal Electronically signed by Ria Commander Signature Date/Time: 03/11/2024/5:59:16 PM    Final    CT Angio Chest Pulmonary Embolism (PE) W or WO Contrast Result Date: 03/09/2024 EXAM: CTA of the Chest with contrast for PE 03/09/2024 12:17:09 AM TECHNIQUE: CTA of the chest was performed after the administration of intravenous contrast. Multiplanar reformatted images are provided for review. MIP images are provided for review. Automated exposure control, iterative reconstruction, and/or weight based adjustment of the mA/kV was utilized to reduce the radiation dose to as low as reasonably achievable. COMPARISON: None available. CLINICAL HISTORY: Pulmonary embolism (PE) suspected, high prob. FINDINGS: PULMONARY ARTERIES: Pulmonary arteries are adequately opacified for evaluation. No pulmonary embolism. Main pulmonary artery is normal in caliber. MEDIASTINUM: The heart and pericardium demonstrate no acute abnormality. No pericardial effusion. Mild atherosclerotic calcification within the thoracic aorta. No aortic aneurysm. LYMPH NODES: No mediastinal, hilar or axillary lymphadenopathy. LUNGS AND PLEURA: Extensive bilateral airspace infiltrate with bilateral lower lobe dependent consolidation in keeping with multifocal infection or aspiration. Small bilateral pleural effusions. No pneumothorax. No central obstructing lesion. UPPER ABDOMEN: Moderate hiatal hernia. Cholelithiasis without superimposed pericholecystic inflammatory change. No intra or extrahepatic biliary ductal dilation. SOFT TISSUES AND BONES: Sclerotic margined lytic lesion within the T3 vertebral body, possibly  an intraosseous hemangioma, with superimposed pathologic compression deformity noted. This is likely chronic in nature. Chronic mild L5 compression deformity noted. No retropulsion. Osseous structures are otherwise age appropriate. Right reverse shoulder arthroplasty noted. No acute bone abnormality. IMPRESSION: 1. No pulmonary embolism. 2. Extensive bilateral airspace infiltrate with bilateral lower lobe dependent consolidation, consistent with multifocal infection or aspiration. 3. Small bilateral pleural effusions. 4. Moderate hiatal hernia. Electronically signed by: Dorethia Molt MD 03/09/2024 12:24 AM EDT RP Workstation: [id number]   DG Chest Port 1 View Result Date: 03/08/2024 EXAM: 1 VIEW XRAY OF THE CHEST 03/08/2024 11:37:42 AM COMPARISON: 03/05/2024 CLINICAL HISTORY: Questionable sepsis - evaluate for abnormality. Per chart: tachycardia. She is currently receiving chemo. Was seen last week for fevers at outpatient office. 89% on RA  during triage. FINDINGS: LUNGS AND PLEURA: Calcified right lower lobe granuloma. No focal pulmonary opacity. No pulmonary edema. No pleural effusion. No pneumothorax. HEART AND MEDIASTINUM: Aortic calcification. No acute abnormality of the cardiac and mediastinal silhouettes. BONES AND SOFT TISSUES: Right shoulder arthroplasty noted. Old healed right rib fractures. Degenerative changes of spine. No acute osseous abnormality. IMPRESSION: 1. No acute findings. Electronically signed by: Waddell Calk MD 03/08/2024 12:10 PM EDT RP Workstation: [id number]   DG Chest 2 View Result Date: 03/05/2024 CLINICAL DATA:  Fever, shortness of breath EXAM: CHEST - 2 VIEW COMPARISON:  April 05, 2021 chest radiograph FINDINGS: Cyst unchanged chronic calcified pulmonary granuloma in the right lower lung. No focal consolidations. No pleural effusions. No pneumothorax. Unchanged cardiomediastinal silhouette. No acute osseous findings. Partially imaged right shoulder arthroplasty hardware.  IMPRESSION: No acute findings. Electronically Signed   By: Michaeline Blanch M.D.   On: 03/05/2024 12:07    ASSESSMENT & PLAN Jean Davidson is a 78 y.o.  female with medical history significant for relapsed/refractory multiple myeloma who presents for a follow up visit.   # Relapsed/Refractory IgA Lambda Multiple Myeloma, In Remission -- Patient is undergone numerous prior therapies including autosomal stem cell transplant, allogenic stem cell transplant, CAR-T therapy, and now bispecific therapy --Patient completed her induction of bispecific therapy at [facility] --Patient transitioned to [facility] on 01/09/2024 with weekly Elrexfio .  --03/08/2024-03/20/2024: patient was admitted sepsis, acute hypoxic respiratory failure due to community acquired pneumonia. During hospitalization, patient received IVIG. Completed course of IV vancomycin , cefepime  and Flagyl . Patient reported not been taking atovaquone  at home due to taste (takes every couple days).  PLAN: --Labs today show WBC 4.2, ANC 1.4, hemoglobin 11.0, platelets 107, creatinine 1.11, calcium  normal, LFTs normal. --Proceed with treatment today without any dose modifications. Will proceed with Elrexfio  every other week moving forward.  --RTC in twp week with labs and follow up before next cycle of Elrexfio .   #Hypokalemia: --Currently on 40 mEq of p.o. potassium chloride  daily.  Potassium levels 4.2 today -- Recommend to continue PO supplementation.   #Systolic heart failure: --Found during recent hospitalization with LVEF of 35-40%. --Seen by cardiology, recommended continue metoprolol  XL 25 mg daily, Lasix  80 mg daily  --Advised to follow up with cardiology if lasix  needs to be continued  #Supportive Care -- zofran  8mg  q8H PRN and compazine  10mg  PO q6H for nausea -- acyclovir  400mg  PO BID for VCZ prophylaxis --Continue atovaquone  1500 mg twice daily --Last received IVIG on 03/15/2024. Recommend to continue monthly.  --  EMLA  cream for port -- no pain medication required at this time.   No orders of the defined types were placed in this encounter.   All questions were answered. The patient knows to call the clinic with any problems, questions or concerns.   I have spent a total of 30 minutes minutes of face-to-face and non-face-to-face time, preparing to see the patient,performing a medically appropriate examination, counseling and educating the patient, ordering medications/tests/procedures,  documenting clinical information in the electronic health record, independently interpreting results and communicating results to the patient, and care coordination.   Johnston Police PA-C Dept of Hematology and Oncology [facility] Phone: [phone]   04/02/2024 4:41 PM

## 2024-04-02 NOTE — Progress Notes (Signed)
 ANC 1.4, okay to treat per Irene, PA-C  Fairy Ashlock Presnell, PharmD Oncology Infusion Pharmacist 04/02/2024 2:50 PM

## 2024-04-02 NOTE — Patient Instructions (Signed)
 [facility] - A DEPT OF [facility]  Discharge Instructions: Thank you for choosing [facility] to provide your oncology and hematology care.   If you have a lab appointment with the Cancer Center, please go directly to the Cancer Center and check in at the registration area.   Wear comfortable clothing and clothing appropriate for easy access to any Portacath or PICC line.   We strive to give you quality time with your provider. You may need to reschedule your appointment if you arrive late (15 or more minutes).  Arriving late affects you and other patients whose appointments are after yours.  Also, if you miss three or more appointments without notifying the office, you may be dismissed from the clinic at the provider's discretion.      For prescription refill requests, have your pharmacy contact our office and allow 72 hours for refills to be completed.    Today you received the following chemotherapy and/or immunotherapy agents Elranatamab     To help prevent nausea and vomiting after your treatment, we encourage you to take your nausea medication as directed.  BELOW ARE SYMPTOMS THAT SHOULD BE REPORTED IMMEDIATELY: *FEVER GREATER THAN 100.4 F (38 C) OR HIGHER *CHILLS OR SWEATING *NAUSEA AND VOMITING THAT IS NOT CONTROLLED WITH YOUR NAUSEA MEDICATION *UNUSUAL SHORTNESS OF BREATH *UNUSUAL BRUISING OR BLEEDING *URINARY PROBLEMS (pain or burning when urinating, or frequent urination) *BOWEL PROBLEMS (unusual diarrhea, constipation, pain near the anus) TENDERNESS IN MOUTH AND THROAT WITH OR WITHOUT PRESENCE OF ULCERS (sore throat, sores in mouth, or a toothache) UNUSUAL RASH, SWELLING OR PAIN  UNUSUAL VAGINAL DISCHARGE OR ITCHING   Items with * indicate a potential emergency and should be followed up as soon as possible or go to the Emergency Department if any problems should occur.  Please show the CHEMOTHERAPY ALERT CARD or IMMUNOTHERAPY  ALERT CARD at check-in to the Emergency Department and triage nurse.  Should you have questions after your visit or need to cancel or reschedule your appointment, please contact [facility] - A DEPT OF [facility]  Dept: [phone]  and follow the prompts.  Office hours are 8:00 a.m. to 4:30 p.m. Monday - Friday. Please note that voicemails left after 4:00 p.m. may not be returned until the following business day.  We are closed weekends and major holidays. You have access to a nurse at all times for urgent questions. Please call the main number to the clinic Dept: [phone] and follow the prompts.   For any non-urgent questions, you may also contact your provider using MyChart. We now offer e-Visits for anyone 73 and older to request care online for non-urgent symptoms. For details visit [email or web address].   Also download the MyChart app! Go to the app store, search MyChart, open the app, select [facility], and log in with your MyChart username and password.  Elranatamab  Injection What is this medication? ELRANATAMAB  (EL ra NAT a mab) treats multiple myeloma, a type of bone marrow cancer. It works by helping your immune system slow or stop the spread of cancer cells. This medicine may be used for other purposes; ask your health care provider or pharmacist if you have questions. COMMON BRAND NAME(S): ELREXFIO  What should I tell my care team before I take this medication? They need to know if you have any of these conditions: Infection An unusual or allergic reaction to elranatamab , other medications, foods, dyes,  or preservatives Pregnant or trying to get pregnant Breastfeeding How should I use this medication? This medication is injected under the skin. It is given by your care team in a hospital or clinic setting. A special MedGuide will be given to you before each treatment. Be sure to read this information carefully each time. Talk to your care  team about the use of this medication in children. Special care may be needed. Overdosage: If you think you have taken too much of this medicine contact a poison control center or emergency room at once. NOTE: This medicine is only for you. Do not share this medicine with others. What if I miss a dose? Keep appointments for follow-up doses. It is important not to miss your dose. Call your care team if you are unable to keep an appointment. What may interact with this medication? This medication may affect how other medications work. Talk with your care team about all of the medications you take. They may suggest changes to your treatment plan to lower the risk of side effects and to make sure your medications work as intended. This list may not describe all possible interactions. Give your health care provider a list of all the medicines, herbs, non-prescription drugs, or dietary supplements you use. Also tell them if you smoke, drink alcohol , or use illegal drugs. Some items may interact with your medicine. What should I watch for while using this medication? Your condition will be monitored carefully while you are receiving this medication. You may need blood work while taking this medication. This medication may increase your risk of getting an infection. Call your care team for advice if you get a fever, chills, sore throat, or other symptoms of a cold or flu. Do not treat yourself. Try to avoid being around people who are sick. This medication may affect your coordination, reaction time, or judgment. Do not drive or operate machinery until you know how this medication affects you. Sit up or stand slowly to reduce the risk of dizzy or fainting spells. Carry the Patient Wallet Card with you at all times. Show it to all members of your care team. It describes the signs and symptoms of severe side effects you may have while taking this medication. Talk to your care team if you may be pregnant. Serious  birth defects can occur if you take this medication during pregnancy and for 4 months after the last dose. You will need a negative pregnancy test before starting this medication. Contraception is recommended while taking this medication and for 4 months after the last dose. Your care team can help you find the option that works for you. Do not breastfeed while taking this medication and for 4 months after the last dose. What side effects may I notice from receiving this medication? Side effects that you should report to your care team as soon as possible: Allergic reactions--skin rash, itching, hives, swelling of the face, lips, tongue, or throat Fever, chills, unusual weakness or fatigue, loss of appetite, nausea, headache, dizziness, feeling faint or lightheaded, shortness of breath, fast or irregular heartbeat, which may be signs of cytokine release syndrome Infection--fever, chills, cough, sore throat, wounds that don't heal, pain or trouble when passing urine, general feeling of discomfort or being unwell Liver injury--right upper belly pain, loss of appetite, nausea, light-colored stool, dark yellow or brown urine, yellowing skin or eyes, unusual weakness or fatigue Low red blood cell level--unusual weakness or fatigue, dizziness, headache, trouble breathing Pain, tingling, or  numbness in the hands or feet, muscle weakness, change in vision, confusion or trouble speaking, loss of balance or coordination, trouble walking, seizures Unusual bruising or bleeding Side effects that usually do not require medical attention (report these to your care team if they continue or are bothersome): Diarrhea Loss of appetite Muscle pain Nausea Pain, redness, or irritation at injection site Skin rash This list may not describe all possible side effects. Call your doctor for medical advice about side effects. You may report side effects to FDA at 1-800-FDA-1088. Where should I keep my medication? This  medication is given in a hospital or clinic. It will not be stored at home. NOTE: This sheet is a summary. It may not cover all possible information. If you have questions about this medicine, talk to your doctor, pharmacist, or health care provider.  2024 Elsevier/Gold Standard (2023-05-25 00:00:00)

## 2024-04-03 ENCOUNTER — Encounter: Payer: Self-pay | Admitting: Hematology and Oncology

## 2024-04-03 ENCOUNTER — Telehealth: Payer: Self-pay | Admitting: *Deleted

## 2024-04-03 NOTE — Telephone Encounter (Signed)
 Received vm message from pt asking for call back. She has been unable to reach [facility] to ask about taking flomax  for her urinary retention. She is asking if I could try to call them. TCT patient to confirm her request. TCT [facility]. Unable to speak to triage but was able to leave vm message them to call patient on her cell phone (# provided). Also provided this office # to call in case they had any questions.

## 2024-04-03 NOTE — Telephone Encounter (Signed)
 Oncology Pharmacist Encounter  Patient returned to clinic on 04/02/24 to receive Elrexfio  medication. Fixed treatment plan to move to every other week administration due to previous infection. Additionally, IVIG plan added for physician to sign so patient can continue to receive. MD notified plans are ready to be signed.   Raistlin Gum, PharmD Hematology/Oncology Clinical Pharmacist [facility] [phone]

## 2024-04-04 ENCOUNTER — Encounter: Payer: Self-pay | Admitting: Physician Assistant

## 2024-04-04 ENCOUNTER — Ambulatory Visit: Attending: Physician Assistant | Admitting: Physician Assistant

## 2024-04-04 ENCOUNTER — Telehealth: Payer: Self-pay | Admitting: *Deleted

## 2024-04-04 VITALS — BP 115/72 | HR 91 | Ht 62.0 in | Wt 152.0 lb

## 2024-04-04 DIAGNOSIS — I10 Essential (primary) hypertension: Secondary | ICD-10-CM | POA: Diagnosis not present

## 2024-04-04 DIAGNOSIS — I5022 Chronic systolic (congestive) heart failure: Secondary | ICD-10-CM

## 2024-04-04 DIAGNOSIS — I340 Nonrheumatic mitral (valve) insufficiency: Secondary | ICD-10-CM

## 2024-04-04 DIAGNOSIS — I471 Supraventricular tachycardia, unspecified: Secondary | ICD-10-CM | POA: Diagnosis not present

## 2024-04-04 MED ORDER — FUROSEMIDE 40 MG PO TABS: 40.0000 mg | ORAL_TABLET | Freq: Every day | ORAL | 1 refills | Status: DC

## 2024-04-04 MED ORDER — POTASSIUM CHLORIDE CRYS ER 20 MEQ PO TBCR: 20.0000 meq | EXTENDED_RELEASE_TABLET | Freq: Every day | ORAL | 1 refills | Status: DC

## 2024-04-04 MED ORDER — IVABRADINE HCL 5 MG PO TABS
10.0000 mg | ORAL_TABLET | Freq: Once | ORAL | 0 refills | Status: AC
Start: 2024-04-04 — End: 2024-04-04

## 2024-04-04 MED ORDER — METOPROLOL SUCCINATE ER 25 MG PO TB24
25.0000 mg | ORAL_TABLET | Freq: Every evening | ORAL | 1 refills | Status: DC
Start: 2024-04-04 — End: 2024-04-22

## 2024-04-04 NOTE — Telephone Encounter (Signed)
 Received vm message from Jean Davidson with [facility] health. She is asking for a call back to see if Dr. Federico will sign home health orders. ' TCT Jean and spoke with her. Advised that Dr. Federico is agreeable to pt's home health services of PT, OT and skilled nursing. Provided our fax # to her so she can fax orders to us .

## 2024-04-04 NOTE — Patient Instructions (Addendum)
 Medication Instructions:  CHANGE Start taking Metoprolol  Succinate 25 mg (1 tablet) at night START taking Furosemide  40 mg (1 tablet) in the morning. Take an additional 40 mg (1 tablet) of furosemide  for weight gain of 3 lbs. Or more overnight.  START Potassium 20meq Take 1 tablet once a day  Take 1 time dose of Corlanor 2 hours prior to test  *If you need a refill on your cardiac medications before your next appointment, please call your pharmacy*  Lab Work: None ordered If you have labs (blood work) drawn today and your tests are completely normal, you will receive your results only by: MyChart Message (if you have MyChart) OR A paper copy in the mail If you have any lab test that is abnormal or we need to change your treatment, we will call you to review the results.  Testing/Procedures: CORONARY CTA   Your physician has requested that you have an echocardiogram. Echocardiography is a painless test that uses sound waves to create images of your heart. It provides your doctor with information about the size and shape of your heart and how well your heart's chambers and valves are working. This procedure takes approximately one hour. There are no restrictions for this procedure. Please do NOT wear cologne, perfume, aftershave, or lotions (deodorant is allowed). Please arrive 15 minutes prior to your appointment time.  Please note: We ask at that you not bring children with you during ultrasound (echo/ vascular) testing. Due to room size and safety concerns, children are not allowed in the ultrasound rooms during exams. Our front office staff cannot provide observation of children in our lobby area while testing is being conducted. An adult accompanying a patient to their appointment will only be allowed in the ultrasound room at the discretion of the ultrasound technician under special circumstances. We apologize for any inconvenience.  Follow-Up: At [facility], you and your  health needs are our priority.  As part of our continuing mission to provide you with exceptional heart care, our providers are all part of one team.  This team includes your primary Cardiologist (physician) and Advanced Practice Providers or APPs (Physician Assistants and Nurse Practitioners) who all work together to provide you with the care you need, when you need it.  Your next appointment:   3 week(s)  Provider:   Jon Hails, PA    We recommend signing up for the patient portal called MyChart.  Sign up information is provided on this After Visit Summary.  MyChart is used to connect with patients for Virtual Visits (Telemedicine).  Patients are able to view lab/test results, encounter notes, upcoming appointments, etc.  Non-urgent messages can be sent to your provider as well.   To learn more about what you can do with MyChart, go to [email or web address].   Other Instructions   Check you weight daily and log your readings. Bring with you to your next appointment.      Your cardiac CT will be scheduled at one of the below locations:     [facility] [address]  [place], [place] [address] [phone]   If scheduled at the Heart and Vascular Tower at [facility] street, please enter the parking lot using the [address] entrance and use the FREE valet service at the patient drop-off area. Enter the building and check-in with registration on the main floor.   Please follow these instructions carefully (unless otherwise directed):  An IV will be required for this  test and Nitroglycerin will be given.  Hold all erectile dysfunction medications at least 3 days (72 hrs) prior to test. (Ie viagra, cialis, sildenafil, tadalafil, etc)   On the Night Before the Test: Be sure to Drink plenty of water. Do not consume any caffeinated/decaffeinated beverages or chocolate 12 hours prior to your test. Do not take any antihistamines 12 hours prior to  your test.  On the Day of the Test: Drink plenty of water until 1 hour prior to the test. Do not eat any food 1 hour prior to test. You may take your regular medications prior to the test.  Take Corlanor (IVABRADINE) two hours prior to test. Please HOLD Lasix  (furosemide ) on the morning of the test. Patients who wear a continuous glucose monitor MUST remove the device prior to scanning. FEMALES- please wear underwire-free bra if available, avoid dresses & tight clothing       After the Test: Drink plenty of water. After receiving IV contrast, you may experience a mild flushed feeling. This is normal. On occasion, you may experience a mild rash up to 24 hours after the test. This is not dangerous. If this occurs, you can take Benadryl  25 mg, Zyrtec, Claritin, or Allegra and increase your fluid intake. (Patients taking Tikosyn should avoid Benadryl , and may take Zyrtec, Claritin, or Allegra) If you experience trouble breathing, this can be serious. If it is severe call 911 IMMEDIATELY. If it is mild, please call our office.  We will call to schedule your test 2-4 weeks out understanding that some insurance companies will need an authorization prior to the service being performed.   For more information and frequently asked questions, please visit our website : [email or web address]  For non-scheduling related questions, please contact the cardiac imaging nurse navigator should you have any questions/concerns: Cardiac Imaging Nurse Navigators Direct Office Dial: [phone]   For scheduling needs, including cancellations and rescheduling, please call [place], [phone].

## 2024-04-06 ENCOUNTER — Emergency Department (HOSPITAL_COMMUNITY)
Admission: EM | Admit: 2024-04-06 | Discharge: 2024-04-06 | Disposition: A | Discharge status: Home / Self Care | Attending: Emergency Medicine | Admitting: Emergency Medicine

## 2024-04-06 ENCOUNTER — Other Ambulatory Visit: Payer: Self-pay

## 2024-04-06 DIAGNOSIS — Z7982 Long term (current) use of aspirin: Secondary | ICD-10-CM | POA: Insufficient documentation

## 2024-04-06 DIAGNOSIS — N3001 Acute cystitis with hematuria: Secondary | ICD-10-CM | POA: Insufficient documentation

## 2024-04-06 DIAGNOSIS — R319 Hematuria, unspecified: Secondary | ICD-10-CM | POA: Diagnosis present

## 2024-04-06 LAB — CBC WITH DIFFERENTIAL/PLATELET
Abs Immature Granulocytes: 0.04 K/uL (ref 0.00–0.07)
Basophils Absolute: 0 K/uL (ref 0.0–0.1)
Basophils Relative: 0 %
Eosinophils Absolute: 0 K/uL (ref 0.0–0.5)
Eosinophils Relative: 0 %
HCT: 27.9 % — ABNORMAL LOW (ref 36.0–46.0)
Hemoglobin: 8.9 g/dL — ABNORMAL LOW (ref 12.0–15.0)
Immature Granulocytes: 1 %
Lymphocytes Relative: 55 %
Lymphs Abs: 2 K/uL (ref 0.7–4.0)
MCH: 33.8 pg (ref 26.0–34.0)
MCHC: 31.9 g/dL (ref 30.0–36.0)
MCV: 106.1 fL — ABNORMAL HIGH (ref 80.0–100.0)
Monocytes Absolute: 0.4 K/uL (ref 0.1–1.0)
Monocytes Relative: 12 %
Neutro Abs: 1.1 K/uL — ABNORMAL LOW (ref 1.7–7.7)
Neutrophils Relative %: 32 %
Platelets: 85 K/uL — ABNORMAL LOW (ref 150–400)
RBC: 2.63 MIL/uL — ABNORMAL LOW (ref 3.87–5.11)
RDW: 15.9 % — ABNORMAL HIGH (ref 11.5–15.5)
WBC: 3.5 K/uL — ABNORMAL LOW (ref 4.0–10.5)
nRBC: 0 % (ref 0.0–0.2)

## 2024-04-06 LAB — URINALYSIS, ROUTINE W REFLEX MICROSCOPIC
Bilirubin Urine: NEGATIVE
Glucose, UA: NEGATIVE mg/dL
Ketones, ur: 5 mg/dL — AB
Leukocytes,Ua: NEGATIVE
Nitrite: POSITIVE — AB
Protein, ur: 100 mg/dL — AB
RBC / HPF: >50 RBC/hpf (ref 0–5)
Specific Gravity, Urine: 1.021 (ref 1.005–1.030)
WBC, UA: >50 WBC/hpf (ref 0–5)
pH: 5 (ref 5.0–8.0)

## 2024-04-06 LAB — PROTIME-INR
INR: 1 (ref 0.8–1.2)
Prothrombin Time: 14 s (ref 11.4–15.2)

## 2024-04-06 MED ORDER — CEPHALEXIN 500 MG PO CAPS: 500.0000 mg | ORAL_CAPSULE | Freq: Three times a day (TID) | ORAL | 0 refills | Status: DC

## 2024-04-06 MED ORDER — SODIUM CHLORIDE 0.9 % IV SOLN
1.0000 g | Freq: Once | INTRAVENOUS | Status: AC
  Administered 2024-04-06: 1 g via INTRAVENOUS
  Filled 2024-04-06: qty 10

## 2024-04-06 NOTE — ED Provider Notes (Signed)
 Jean Davidson AT [facility] Provider Note   CSN: [id number] Arrival date & time: 04/06/24  9191     Patient presents with: Hematuria   Jean Davidson is a 78 y.o. female.   HPI    78 year old female comes in with chief complaint of bloody urine. Patient recently was admitted to the hospital for volume overload and severe sepsis.  She had Foley catheter placed and is currently slated to follow[facility]up with [facility] for further urodynamic studies.  Patient states that yesterday she started noticing change in the urine color.  Foley catheter is now revealing dark urine.  She has no abdominal pain.  The Foley catheter now has been in place for at least 2 weeks.  Patient denies any associated nausea, vomiting, fevers, chills, back pain.   Prior to Admission medications   Medication Sig Start Date End Date Taking? Authorizing Provider  cephALEXin (KEFLEX) 500 MG capsule Take 1 capsule (500 mg total) by mouth 3 (three) times daily. 04/06/24  Yes Charlyn Sora, MD  acyclovir  (ZOVIRAX ) 400 MG tablet Take 1 tablet (400 mg total) by mouth 2 (two) times daily. 08/07/23   Lonn Hicks, MD  aspirin  EC 81 MG tablet Take 81 mg by mouth daily. Swallow whole.    [provider]  atovaquone  (MEPRON ) 750 MG/5ML suspension TAKE 10 MLS (1,500 MG TOTAL) BY MOUTH 2 (TWO) TIMES DAILY. 03/04/24   Federico Norleen ONEIDA MADISON, MD  cholecalciferol  (VITAMIN D3) 25 MCG (1000 UNIT) tablet Take 4,000 Units by mouth daily. Instructed to increase to 4,000 units daily    [provider]  furosemide  (LASIX ) 40 MG tablet Take 1 tablet (40 mg total) by mouth daily. Can take an additional an additional tablet as needed for weight gain of 3lbs in a day or 5lbs in a week 04/04/24   Duke, Jon Garre, PA  guaiFENesin -dextromethorphan  (ROBITUSSIN DM) 100-10 MG/5ML syrup Take 5 mLs by mouth every 4 (four) hours as needed for cough (chest congestion). 03/20/24   Rai, Ripudeep K, MD   melatonin 5 MG TABS Take 1 tablet (5 mg total) by mouth at bedtime as needed. 03/20/24   Rai, Nydia POUR, MD  metoprolol  succinate (TOPROL -XL) 25 MG 24 hr tablet Take 1 tablet (25 mg total) by mouth at bedtime. 04/04/24   Duke, Jon Garre, PA  mirtazapine  (REMERON ) 15 MG tablet TAKE 1 TABLET BY MOUTH EVERYDAY AT BEDTIME 01/28/24   Dorsey, John T IV, MD  Multiple Vitamins-Minerals (PRESERVISION AREDS 2 PO) Take 1 tablet by mouth 2 (two) times daily.    [provider]  ondansetron  (ZOFRAN ) 8 MG tablet Take 1 tablet (8 mg total) by mouth every 8 (eight) hours as needed (Nausea or vomiting). Patient taking differently: Take 8 mg by mouth 2 (two) times daily. 09/26/22   Lonn Hicks, MD  pantoprazole  (PROTONIX ) 40 MG tablet TAKE 1 TABLET BY MOUTH 2 TIMES DAILY. TAKE 30-60 MINUTES BEFORE BREAKFAST AND DINNER. 03/06/24   Avram Lupita BRAVO, MD  potassium chloride  SA (KLOR-CON  M20) 20 MEQ tablet Take 1 tablet (20 mEq total) by mouth daily. 04/04/24 07/03/24  Madie Jon Garre, PA  prochlorperazine  (COMPAZINE ) 5 MG tablet Take 5 mg by mouth every 6 (six) hours as needed for nausea or vomiting.    [provider]  senna-docusate (SENOKOT-S) 8.6-50 MG tablet Take 2 tablets by mouth daily as needed. 10/23/22   [provider]    Allergies: Patient has no known allergies.    Review  of Systems  All other systems reviewed and are negative.   Updated Vital Signs BP 110/64 (BP Location: Left Arm)   Pulse (!) 103   Temp 98.5 F (36.9 C) (Oral)   Resp 18   SpO2 94%   Physical Exam Vitals and nursing note reviewed.  Constitutional:      Appearance: She is well-developed.  HENT:     Head: Atraumatic.  Eyes:     Extraocular Movements: Extraocular movements intact.     Pupils: Pupils are equal, round, and reactive to light.  Cardiovascular:     Rate and Rhythm: Normal rate.  Pulmonary:     Effort: Pulmonary effort is normal.  Musculoskeletal:     Cervical back: Normal range of  motion and neck supple.  Skin:    General: Skin is warm and dry.  Neurological:     Mental Status: She is alert and oriented to person, place, and time.     (all labs ordered are listed, but only abnormal results are displayed) Labs Reviewed  URINALYSIS, ROUTINE W REFLEX MICROSCOPIC - Abnormal; Notable for the following components:      Result Value   APPearance CLOUDY (*)    Hgb urine dipstick LARGE (*)    Ketones, ur 5 (*)    Protein, ur 100 (*)    Nitrite POSITIVE (*)    Bacteria, UA MANY (*)    All other components within normal limits  URINE CULTURE  PROTIME-INR  CBC WITH DIFFERENTIAL/PLATELET  CBC WITH DIFFERENTIAL/PLATELET  BASIC METABOLIC PANEL WITH GFR    EKG: None  Radiology: No results found.   Procedures   Medications Ordered in the ED  cefTRIAXone (ROCEPHIN) 1 g in sodium chloride  0.9 % 100 mL IVPB (has no administration in time range)                                    Medical Decision Making Amount and/or Complexity of Data Reviewed Labs: ordered.  Risk Prescription drug management.   78 year old female with history of recent admission for severe sepsis and known history of CHF, multiple myeloma comes in with chief complaint of blood in the urine.  Patient was discharged from the hospital with a Foley catheter.  Patient has no systemic symptoms, just what appears to be gross hematuria.  I do not see any blood clots.  Differential diagnosis for her primarily includes acute cystitis with hemorrhage.  Other possibilities include traumatic gross hematuria and tumor.  Plan is to get a bladder scan, if patient is draining appropriately then replace the Foley catheter, we will place a 20 French Foley catheter in place of 16 [place] 1 that she has now.  UA will be sent from the new Foley catheter.   Reassessment: Patient's urine analysis is concerning for UTI.  Likely has cystitis with hemorrhage.  BMP still pending.  But patient is looking well.  We  will give her IV ceftriaxone.  She is stable for discharge.  I have sent a message to Dr. Cam and Dr. Carolee who she is seeing about this encounter at her request.  The patient appears reasonably screened and/or stabilized for discharge and I doubt any other medical condition or other [facility] requiring further screening, evaluation, or treatment in the ED at this time prior to discharge.   Results from the ER workup discussed with the patient face to face and all questions answered to the  best of my ability. The patient is safe for discharge with strict return precautions.   Final diagnoses:  Acute cystitis with hematuria    ED Discharge Orders          Ordered    cephALEXin (KEFLEX) 500 MG capsule  3 times daily        04/06/24 1312               Charlyn Sora, MD 04/06/24 1319

## 2024-04-06 NOTE — Discharge Instructions (Addendum)
 You were seen in the emergency room for bloody urine. The workup in the emergency room reveals that you have infection.  Please take the antibiotics that are prescribed. Follow-up with the urologist.  We have sent a message to the urology team to see if they can see you sooner.  Return to the emergency room if you start having severe pain, severe nausea and vomiting, or if the bleeding starts clogging your Foley catheter.

## 2024-04-06 NOTE — ED Triage Notes (Signed)
 Dc'ed two weeks ago from ICU, then attended rehab. Septic, pneumonia, UTI. Hx blood cancer with current treatment. Main concern today is bloody urine output in foley cath. Has been in place for three weeks. Denies pain, N/V.

## 2024-04-06 NOTE — ED Notes (Signed)
 PT will be discharged with urinary catheter in place.

## 2024-04-08 ENCOUNTER — Telehealth: Payer: Self-pay

## 2024-04-08 LAB — URINE CULTURE: Culture: >=100000 — AB

## 2024-04-08 NOTE — Telephone Encounter (Signed)
 Received call from Beverley with [facility] health stating pt refused nursing home health. She only wants PT/OT. Pt states her caregiver takes care of her foley and she does not want nursing home health. Beverley also wanted us  to know that pt was recently diagnosed with UTI and is currently taking keflex.

## 2024-04-09 ENCOUNTER — Inpatient Hospital Stay

## 2024-04-09 ENCOUNTER — Inpatient Hospital Stay: Admitting: Hematology and Oncology

## 2024-04-09 ENCOUNTER — Telehealth (HOSPITAL_BASED_OUTPATIENT_CLINIC_OR_DEPARTMENT_OTHER): Payer: Self-pay | Admitting: *Deleted

## 2024-04-09 ENCOUNTER — Ambulatory Visit

## 2024-04-09 NOTE — Telephone Encounter (Signed)
 Post ED Visit - Positive Culture Follow-up  Culture report reviewed by antimicrobial stewardship pharmacist: [facility] []  Rankin Dee, Pharm.D. []  Venetia Gully, Pharm.D., BCPS AQ-ID []  Garrel Crews, Pharm.D., BCPS []  Almarie Lunger, Pharm.D., BCPS []  [place], [place].D., BCPS, AAHIVP []  Rosaline Bihari, Pharm.D., BCPS, AAHIVP []  Vernell Meier, PharmD, BCPS []  Latanya Hint, PharmD, BCPS []  Donald Medley, PharmD, BCPS []  Rocky Bold, PharmD []  Dorothyann Alert, PharmD, BCPS []  Morene Babe, PharmD  [facility] [x]  Rodericks Fobbs, PharmD []  Romona Bliss, PharmD []  Dolphus Roller, PharmD []  Veva Seip, Rph []  Vernell Daunt) Leonce, PharmD []  Eva Allis, PharmD []  Rosaline Millet, PharmD []  Iantha Batch, PharmD []  Arvin Gauss, PharmD []  Wanda Hasting, PharmD []  Ronal Rav, PharmD []  Rocky Slade, PharmD []  Bard Jeans, PharmD   Positive urine culture Treated with cephalexin, organism sensitive to the same and no further patient follow-up is required at this time.  Lorita Barnie Pereyra 04/09/2024, 11:48 AM

## 2024-04-10 ENCOUNTER — Telehealth: Payer: Self-pay | Admitting: *Deleted

## 2024-04-10 ENCOUNTER — Other Ambulatory Visit: Payer: Self-pay

## 2024-04-10 NOTE — Telephone Encounter (Signed)
 Received call from pt's home physical therapist requesting ongoing orders for home PT.  Verbal orders provided for PT 2x a week for 2 weeks then1 x a week for 6 weeks.

## 2024-04-14 ENCOUNTER — Ambulatory Visit: Admitting: Medical

## 2024-04-15 ENCOUNTER — Other Ambulatory Visit: Payer: Self-pay | Admitting: Hematology and Oncology

## 2024-04-15 DIAGNOSIS — C9002 Multiple myeloma in relapse: Secondary | ICD-10-CM

## 2024-04-16 ENCOUNTER — Other Ambulatory Visit: Payer: Self-pay | Admitting: Hematology and Oncology

## 2024-04-16 ENCOUNTER — Inpatient Hospital Stay

## 2024-04-16 ENCOUNTER — Encounter: Payer: Self-pay | Admitting: Hematology and Oncology

## 2024-04-16 ENCOUNTER — Ambulatory Visit: Admitting: Hematology and Oncology

## 2024-04-16 ENCOUNTER — Inpatient Hospital Stay (HOSPITAL_BASED_OUTPATIENT_CLINIC_OR_DEPARTMENT_OTHER): Admitting: Hematology and Oncology

## 2024-04-16 VITALS — BP 134/73 | HR 98 | Temp 97.8°F | Resp 16 | Wt 151.8 lb

## 2024-04-16 DIAGNOSIS — D708 Other neutropenia: Secondary | ICD-10-CM | POA: Diagnosis not present

## 2024-04-16 DIAGNOSIS — C9001 Multiple myeloma in remission: Secondary | ICD-10-CM | POA: Diagnosis not present

## 2024-04-16 DIAGNOSIS — D539 Nutritional anemia, unspecified: Secondary | ICD-10-CM

## 2024-04-16 DIAGNOSIS — D801 Nonfamilial hypogammaglobulinemia: Secondary | ICD-10-CM

## 2024-04-16 DIAGNOSIS — C9002 Multiple myeloma in relapse: Secondary | ICD-10-CM | POA: Diagnosis not present

## 2024-04-16 LAB — CBC WITH DIFFERENTIAL (CANCER CENTER ONLY)
Abs Immature Granulocytes: 0.02 K/uL (ref 0.00–0.07)
Basophils Absolute: 0 K/uL (ref 0.0–0.1)
Basophils Relative: 1 %
Eosinophils Absolute: 0 K/uL (ref 0.0–0.5)
Eosinophils Relative: 0 %
HCT: 32.5 % — ABNORMAL LOW (ref 36.0–46.0)
Hemoglobin: 10.6 g/dL — ABNORMAL LOW (ref 12.0–15.0)
Immature Granulocytes: 1 %
Lymphocytes Relative: 46 %
Lymphs Abs: 2 K/uL (ref 0.7–4.0)
MCH: 33.5 pg (ref 26.0–34.0)
MCHC: 32.6 g/dL (ref 30.0–36.0)
MCV: 102.8 fL — ABNORMAL HIGH (ref 80.0–100.0)
Monocytes Absolute: 0.5 K/uL (ref 0.1–1.0)
Monocytes Relative: 12 %
Neutro Abs: 1.7 K/uL (ref 1.7–7.7)
Neutrophils Relative %: 40 %
Platelet Count: 121 K/uL — ABNORMAL LOW (ref 150–400)
RBC: 3.16 MIL/uL — ABNORMAL LOW (ref 3.87–5.11)
RDW: 17.4 % — ABNORMAL HIGH (ref 11.5–15.5)
WBC Count: 4.2 K/uL (ref 4.0–10.5)
nRBC: 0 % (ref 0.0–0.2)

## 2024-04-16 LAB — CMP (CANCER CENTER ONLY)
ALT: 13 U/L (ref 0–44)
AST: 22 U/L (ref 15–41)
Albumin: 4.2 g/dL (ref 3.5–5.0)
Alkaline Phosphatase: 80 U/L (ref 38–126)
Anion gap: 9 (ref 5–15)
BUN: 18 mg/dL (ref 8–23)
CO2: 29 mmol/L (ref 22–32)
Calcium: 9.5 mg/dL (ref 8.9–10.3)
Chloride: 102 mmol/L (ref 98–111)
Creatinine: 0.99 mg/dL (ref 0.44–1.00)
GFR, Estimated: 59 mL/min — ABNORMAL LOW (ref >60)
Glucose, Bld: 148 mg/dL — ABNORMAL HIGH (ref 70–99)
Potassium: 3 mmol/L — ABNORMAL LOW (ref 3.5–5.1)
Sodium: 140 mmol/L (ref 135–145)
Total Bilirubin: 0.5 mg/dL (ref 0.0–1.2)
Total Protein: 6.8 g/dL (ref 6.5–8.1)

## 2024-04-16 LAB — MAGNESIUM: Magnesium: 1.7 mg/dL (ref 1.7–2.4)

## 2024-04-16 LAB — LACTATE DEHYDROGENASE: LDH: 217 U/L — ABNORMAL HIGH (ref 98–192)

## 2024-04-16 MED ORDER — ELRANATAMAB-BCMM 76 MG/1.9ML ~~LOC~~ SOLN
76.0000 mg | Freq: Once | SUBCUTANEOUS | Status: AC
  Administered 2024-04-16: 76 mg via SUBCUTANEOUS
  Filled 2024-04-16: qty 1.9

## 2024-04-16 NOTE — Progress Notes (Signed)
 [facility] [phone]   [phone]  PROGRESS NOTE  Patient Care Team: Patient, [name] as PCP - General (General Practice) Darron Deatrice LABOR, MD as PCP - Cardiology (Cardiology) Darren Liborio Backers, MD as Referring Physician (Hematology and Oncology) Guinevere File, MD as Consulting Physician (Internal Medicine) Fleeta Rothman, Jomarie SAILOR, MD as Consulting Physician (Infectious Diseases) Marda Maranda Foots An, MD as Consulting Physician (Hematology and Oncology) Honora Lenon Irving, NP as Nurse Practitioner (Hematology and Oncology) Federico Norleen ONEIDA MADISON, MD as Consulting Physician (Hematology and Oncology) Duke, Jon Garre, [place] as Physician Assistant (Cardiology)  Hematological/Oncological History # Relapsed/Refractory IgA Lambda Multiple Myeloma, In Remission 2016: Underwent autologous stem cell transplant 2017: Underwent allogenic stem cell transplant 10/16/2022: Completed CAR-T therapy with [facility] 09/2023: Started on Elrexfio  due to progression of disease 01/02/2024: Last visit at [facility] where induction therapy was completed 01/09/2024: establish care with Dr. Federico.   Interval History:  Jean Davidson 78 y.o. female with medical history significant for relapsed/refractory multiple myeloma who presents for a follow up visit. The patient's last visit was on 04/02/2024 and since then she has continued bispecific therapy. In the interim since the last visit she has continued maintenance Elrexfio  at [facility].  She unfortunately has developed a COVID infection and is in isolation today for treatment.  [name] reports she is ready for cycle 7 today.  She reports she is still recovering from her COVID infection and hospitalization.  She reports that she has a lot of doctors to juggle including urology, oncology, and cardiology.  She is also working with physical therapy.  She notes that she picked up COVID in a rehab center and  had headaches and a runny nose.  She reports that now she is having symptoms of an upper respiratory infection.  She reports that she sounds terrible.  Her appetite remains good and her energy is steady.  She does not have any issues with today's infusion.  Overall she feels well and is able to proceed with treatment today.  Full 10 point ROS is otherwise negative.   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   Osteopenia    Spinal stenosis in cervical region    Varicose veins of lower extremities with inflammation     SURGICAL HISTORY: Past Surgical History:  Procedure Laterality Date   ANKLE SURGERY Left    BLADDER SUSPENSION  1988/1989   x2   COLONOSCOPY     ESOPHAGOGASTRODUODENOSCOPY     IR IMAGING GUIDED PORT INSERTION  03/01/2018   LIMBAL STEM CELL TRANSPLANT     x2   PORT-A-CATH REMOVAL     SHOULDER SURGERY Right 04/06/2013   right   UPPER GASTROINTESTINAL ENDOSCOPY      SOCIAL HISTORY: Social History   Socioeconomic History   Marital status: Divorced  Spouse name: Not on file   Number of children: Not on file   Years of education: Not on file   Highest education level: Not on file  Occupational History   Not on file  Tobacco Use   Smoking status: Former    Current packs/day: 0.00    Average packs/day: 0.5 packs/day for 4.0 years (2.0 ttl pk-yrs)    Types: Cigarettes    Start date: 06/26/1964    Quit date: 06/26/1968    Years since quitting: 55.8    Smokeless tobacco: Never  Vaping Use   Vaping status: Never Used  Substance and Sexual Activity   Alcohol  use: Yes    Alcohol /week: 1.0 standard drink of alcohol     Types: 1 Cans of beer per week    Comment: 1 per week per pt   Drug use: No   Sexual activity: Not on file  Other Topics Concern   Not on file  Social History Narrative   Jean Davidson,  full code ( reviewed 70)   Social Drivers of Health   Financial Resource Strain: Low Risk  (10/09/2023)   [facility] from [facility]   Overall Financial Resource Strain (CARDIA)    Difficulty of Paying Living Expenses: Not hard at all  Food Insecurity: No Food Insecurity (03/09/2024)   Hunger Vital Sign    Worried About Running Out of Food in the Last Year: Never true    Ran Out of Food in the Last Year: Never true  Transportation Needs: No Transportation Needs (03/09/2024)   PRAPARE - Administrator, Civil Service (Medical): No    Lack of Transportation (Non-Medical): No  Physical Activity: Not on file  Stress: Not on file  Social Connections: Moderately Integrated (03/09/2024)   Social Connection and Isolation Panel    Frequency of Communication with Friends and Family: More than three times a week    Frequency of Social Gatherings with Friends and Family: Twice a week    Attends Religious Services: 1 to 4 times per year    Active Member of [employer] or Organizations: Yes    Attends Banker Meetings: 1 to 4 times per year    Marital Status: Divorced  Catering manager Violence: Not At Risk (03/09/2024)   Humiliation, Afraid, Rape, and Kick questionnaire    Fear of Current or Ex-Partner: No    Emotionally Abused: No    Physically Abused: No    Sexually Abused: No    FAMILY HISTORY: Family History  Problem Relation Age of Onset   Heart disease Mother    Heart failure Father    Heart disease Father    Throat cancer Father    Skin cancer Father     Diabetes Father    Kidney disease Father    Hypertension Brother    Heart disease Brother        congenital shunt, ?    Colon cancer Paternal Aunt 65   Skin cancer Brother    Stomach cancer Neg Hx    Esophageal cancer Neg Hx    Rectal cancer Neg Hx     ALLERGIES:  has no known allergies.  MEDICATIONS:  Current Outpatient Medications  Medication Sig Dispense Refill   acyclovir  (ZOVIRAX ) 400 MG tablet Take 1 tablet (400 mg total) by mouth 2 (two) times daily. 180 tablet 1   aspirin  EC 81 MG tablet Take 81 mg by mouth daily. Swallow whole.  atovaquone  (MEPRON ) 750 MG/5ML suspension TAKE 10 MLS (1,500 MG TOTAL) BY MOUTH 2 (TWO) TIMES DAILY. 420 mL 3   cephALEXin (KEFLEX) 500 MG capsule Take 1 capsule (500 mg total) by mouth 3 (three) times daily. 20 capsule 0   cholecalciferol  (VITAMIN D3) 25 MCG (1000 UNIT) tablet Take 4,000 Units by mouth daily. Instructed to increase to 4,000 units daily     furosemide  (LASIX ) 40 MG tablet Take 1 tablet (40 mg total) by mouth daily. Can take an additional an additional tablet as needed for weight gain of 3lbs in a day or 5lbs in a week 100 tablet 1   guaiFENesin -dextromethorphan  (ROBITUSSIN DM) 100-10 MG/5ML syrup Take 5 mLs by mouth every 4 (four) hours as needed for cough (chest congestion). 118 mL 0   melatonin 5 MG TABS Take 1 tablet (5 mg total) by mouth at bedtime as needed.     metoprolol  succinate (TOPROL -XL) 25 MG 24 hr tablet Take 1 tablet (25 mg total) by mouth at bedtime. 30 tablet 1   mirtazapine  (REMERON ) 15 MG tablet TAKE 1 TABLET BY MOUTH EVERYDAY AT BEDTIME 90 tablet 1   Multiple Vitamins-Minerals (PRESERVISION AREDS 2 PO) Take 1 tablet by mouth 2 (two) times daily.     ondansetron  (ZOFRAN ) 8 MG tablet Take 1 tablet (8 mg total) by mouth every 8 (eight) hours as needed (Nausea or vomiting). (Patient taking differently: Take 8 mg by mouth 2 (two) times daily.) 60 tablet 1   pantoprazole  (PROTONIX ) 40 MG tablet TAKE 1 TABLET BY MOUTH 2  TIMES DAILY. TAKE 30-60 MINUTES BEFORE BREAKFAST AND DINNER. 180 tablet 0   potassium chloride  SA (KLOR-CON  M20) 20 MEQ tablet Take 1 tablet (20 mEq total) by mouth daily. 90 tablet 1   prochlorperazine  (COMPAZINE ) 5 MG tablet Take 5 mg by mouth every 6 (six) hours as needed for nausea or vomiting.     senna-docusate (SENOKOT-S) 8.6-50 MG tablet Take 2 tablets by mouth daily as needed.     No current facility-administered medications for this visit.    REVIEW OF SYSTEMS:   Constitutional: ( - ) fevers, ( - )  chills , ( - ) night sweats Eyes: ( - ) blurriness of vision, ( - ) double vision, ( - ) watery eyes Ears, nose, mouth, throat, and face: ( - ) mucositis, ( - ) sore throat Respiratory: ( - ) cough, ( - ) dyspnea, ( - ) wheezes Cardiovascular: ( - ) palpitation, ( - ) chest discomfort, ( - ) lower extremity swelling Gastrointestinal:  ( - ) nausea, ( - ) heartburn, ( - ) change in bowel habits Skin: ( - ) abnormal skin rashes Lymphatics: ( - ) new lymphadenopathy, ( - ) easy bruising Neurological: ( - ) numbness, ( - ) tingling, ( - ) new weaknesses Behavioral/Psych: ( - ) mood change, ( - ) new changes  All other systems were reviewed with the patient and are negative.  PHYSICAL EXAMINATION: ECOG PERFORMANCE STATUS: 1 - Symptomatic but completely ambulatory  There were no vitals filed for this visit.  There were no vitals filed for this visit.  TELEPHONE VISIT, no physical exam.   LABORATORY DATA:  I have reviewed the data as listed    Latest Ref Rng & Units 04/16/2024    8:32 AM 04/06/2024    1:29 PM 04/02/2024    1:32 PM  CBC  WBC 4.0 - 10.5 K/uL 4.2  3.5  4.2   Hemoglobin 12.0 -  15.0 g/dL 89.3  8.9  88.9   Hematocrit 36.0 - 46.0 % 32.5  27.9  33.7   Platelets 150 - 400 K/uL 121  85  107        Latest Ref Rng & Units 04/16/2024    8:32 AM 04/02/2024    1:32 PM 03/26/2024    9:03 AM  CMP  Glucose 70 - 99 mg/dL 851  896  877   BUN 8 - 23 mg/dL 18  15  31     Creatinine 0.44 - 1.00 mg/dL 9.00  8.88  8.91   Sodium 135 - 145 mmol/L 140  139  136   Potassium 3.5 - 5.1 mmol/L 3.0  4.2  3.1   Chloride 98 - 111 mmol/L 102  102  90   CO2 22 - 32 mmol/L 29  28  35   Calcium  8.9 - 10.3 mg/dL 9.5  9.3  9.7   Total Protein 6.5 - 8.1 g/dL 6.8  7.2  7.0   Total Bilirubin 0.0 - 1.2 mg/dL 0.5  0.4  0.6   Alkaline Phos 38 - 126 U/L 80  74  77   AST 15 - 41 U/L 22  25  22    ALT 0 - 44 U/L 13  18  13      Lab Results  Component Value Date   MPROTEIN Not Observed 03/26/2024   MPROTEIN Not Observed 03/15/2024   MPROTEIN Not Observed 02/27/2024   Lab Results  Component Value Date   KPAFRELGTCHN <0.7 (L) 03/26/2024   KPAFRELGTCHN <0.7 (L) 03/15/2024   KPAFRELGTCHN <0.7 (L) 02/27/2024   LAMBDASER <1.5 (L) 03/26/2024   LAMBDASER <1.5 (L) 03/15/2024   LAMBDASER <1.5 (L) 02/27/2024   KAPLAMBRATIO UNABLE TO CALCULATE 03/26/2024   KAPLAMBRATIO UNABLE TO CALCULATE 03/15/2024   KAPLAMBRATIO UNABLE TO CALCULATE 02/27/2024     RADIOGRAPHIC STUDIES: No results found.  ASSESSMENT & PLAN Jean Davidson 78 y.o. female with medical history significant for relapsed/refractory multiple myeloma who presents for a follow up visit.   # Relapsed/Refractory IgA Lambda Multiple Myeloma, In Remission -- Patient is undergone numerous prior therapies including autosomal stem cell transplant, allogenic stem cell transplant, CAR-T therapy, and now bispecific therapy --Patient completed her induction of bispecific therapy at [facility] -- Patient presents to continue maintenance therapy. PLAN: --Labs today show white blood cell 4.2, hemoglobin 10.6, MCV 102.8, platelets 121.  LFTs and creatinine adequate for continued treatment --Okay to proceed with cycle 7-day 1 of Elrexfio  today --Plan for IVIG to be administered if IgG is less than 400.  Patient above target at last check -- Davidson plan for q 2 weekly maintenance Elrexfio  with clinic visits every treatment.    #Supportive Care -- zofran  8mg  q8H PRN and compazine  10mg  PO q6H for nausea -- acyclovir  400mg  PO BID for VCZ prophylaxis --Continue atovaquone  1500 mg twice daily -- IVIG to be administered when IgG are less than 400 -- EMLA  cream for port -- no pain medication required at this time.   No orders of the defined types were placed in this encounter.   All questions were answered. The patient knows to call the clinic with any problems, questions or concerns.  A total of more than 30 minutes were spent on this encounter with face-to-face time and non-face-to-face time, including preparing to see the patient, ordering tests and/or medications, counseling the patient and coordination of care as outlined above.   Norleen IVAR Kidney, MD Department of Hematology/Oncology [facility] Phone: [phone] Pager: [phone] Email: [email or web address]  04/16/2024 4:00 PM

## 2024-04-16 NOTE — Patient Instructions (Signed)
 [facility] - A DEPT OF [facility]  Discharge Instructions: Thank you for choosing [facility] to provide your oncology and hematology care.   If you have a lab appointment with the Cancer Center, please go directly to the Cancer Center and check in at the registration area.   Wear comfortable clothing and clothing appropriate for easy access to any Portacath or PICC line.   We strive to give you quality time with your provider. You may need to reschedule your appointment if you arrive late (15 or more minutes).  Arriving late affects you and other patients whose appointments are after yours.  Also, if you miss three or more appointments without notifying the office, you may be dismissed from the clinic at the provider's discretion.      For prescription refill requests, have your pharmacy contact our office and allow 72 hours for refills to be completed.    Today you received the following chemotherapy and/or immunotherapy agents: elranatamab -bcmm (ELREXFIO )     To help prevent nausea and vomiting after your treatment, we encourage you to take your nausea medication as directed.  BELOW ARE SYMPTOMS THAT SHOULD BE REPORTED IMMEDIATELY: *FEVER GREATER THAN 100.4 F (38 C) OR HIGHER *CHILLS OR SWEATING *NAUSEA AND VOMITING THAT IS NOT CONTROLLED WITH YOUR NAUSEA MEDICATION *UNUSUAL SHORTNESS OF BREATH *UNUSUAL BRUISING OR BLEEDING *URINARY PROBLEMS (pain or burning when urinating, or frequent urination) *BOWEL PROBLEMS (unusual diarrhea, constipation, pain near the anus) TENDERNESS IN MOUTH AND THROAT WITH OR WITHOUT PRESENCE OF ULCERS (sore throat, sores in mouth, or a toothache) UNUSUAL RASH, SWELLING OR PAIN  UNUSUAL VAGINAL DISCHARGE OR ITCHING   Items with * indicate a potential emergency and should be followed up as soon as possible or go to the Emergency Department if any problems should occur.  Please show the CHEMOTHERAPY ALERT CARD  or IMMUNOTHERAPY ALERT CARD at check-in to the Emergency Department and triage nurse.  Should you have questions after your visit or need to cancel or reschedule your appointment, please contact [facility] - A DEPT OF [facility]  Dept: [phone]  and follow the prompts.  Office hours are 8:00 a.m. to 4:30 p.m. Monday - Friday. Please note that voicemails left after 4:00 p.m. may not be returned until the following business day.  We are closed weekends and major holidays. You have access to a nurse at all times for urgent questions. Please call the main number to the clinic Dept: [phone] and follow the prompts.   For any non-urgent questions, you may also contact your provider using MyChart. We now offer e-Visits for anyone 59 and older to request care online for non-urgent symptoms. For details visit [email or web address].   Also download the MyChart app! Go to the app store, search MyChart, open the app, select [place], and log in with your MyChart username and password.

## 2024-04-21 NOTE — Progress Notes (Unsigned)
 Cardiology Office Note:    Date:  04/22/2024   ID:  Jean Davidson, DOB 1945-10-30, MRN [id number]  PCP:  Patient, [name]   [facility] Providers Cardiologist:  Deatrice Cage, MD Cardiology APP:  Madie Jon Garre, PA     Referring MD: No ref. provider found   Chief Complaint  Patient presents with   Follow-up    CHF    History of Present Illness:    Jean Davidson is a 78 y.o. female with a hx of hypertension, paroxysmal SVT, remote large pericardial effusion resolved without intervention, mild carotic stenosis, hypertension, GERD, chronic anemia, relapsed/refractory multiple myeloma on Elrexfio .  She was initially referred to cardiology in 2016 for palpitations with a Holter monitor showing NSR with infrequent PVCs/PACs.  POET at that time was normal.  Repeat heart monitor in 2020 for increasing palpitations showed multiple runs of SVT.  She was transition from carvedilol  to metoprolol  and did well.  Echocardiogram in 2017 with large pericardial effusion managed by [facility] cardiology, effusion resolved without intervention.  She was recently admitted 02/2024 with severe sepsis and acute hypoxic respiratory failure secondary to pneumonia.  Echocardiogram demonstrated LVEF 35-40% with no RWMA, grade 1 DD, normal RV, moderate MR.  She was diuresed and discharged on Lasix  80 mg daily, 25 mg Toprol .  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  Due to her strong family history of CAD, decision was made to defer ischemic evaluation in the setting of severe illness and revisit possibly CT coronary once recovered.  I saw her in follow-up 04/04/2024 following recent discharge from SNF.  She has a chronic indwelling Foley catheter for prolapsed bladder.  She was ambulating is much as possible, remains on 80 mg Lasix .  When I saw her there was some confusion regarding medications that she was taking at the SNF, specifically potassium  supplementation.  I opted to restart 40 mg of Lasix  with 20 mEq of potassium with BP meds scheduled at the cancer center on 04/16/2024.  Appears that renal function remained stable but K was 3.0.  She presents back for routine cardiology follow-up. She has had significant issues with recurrent UTIs and hematuria. She has had multiple foley catheters, she has followed closely with urology. She continues to have cancer treatments every other Wed. Oncology started ASA many years ago.   She presents today with a rolling walker.  She is making great improvements in her recovery.  Yesterday she bathes and cooked by herself, but is feeling very tired afterwards today.  She does very well managing her medications.  She states that her volume is stable.  She appears euvolemic on exam.  BP improved today, will attempt to better titrate GDMT.    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   Osteopenia    Spinal stenosis in cervical region  Varicose veins of lower extremities with inflammation     Past Surgical History:  Procedure Laterality Date   ANKLE SURGERY Left    BLADDER SUSPENSION  1988/1989   x2   COLONOSCOPY     ESOPHAGOGASTRODUODENOSCOPY     IR IMAGING GUIDED PORT INSERTION  03/01/2018   LIMBAL STEM CELL TRANSPLANT     x2   PORT-A-CATH REMOVAL     SHOULDER SURGERY Right 04/06/2013   right   UPPER GASTROINTESTINAL ENDOSCOPY      Current Medications: Current Meds  Medication Sig    acyclovir  (ZOVIRAX ) 400 MG tablet Take 1 tablet (400 mg total) by mouth 2 (two) times daily.   aspirin  EC 81 MG tablet Take 81 mg by mouth daily. Swallow whole.   atovaquone  (MEPRON ) 750 MG/5ML suspension TAKE 10 MLS (1,500 MG TOTAL) BY MOUTH 2 (TWO) TIMES DAILY.   cholecalciferol  (VITAMIN D3) 25 MCG (1000 UNIT) tablet Take 4,000 Units by mouth daily. Instructed to increase to 4,000 units daily   ciprofloxacin  (CIPRO ) 500 MG tablet Take 500 mg by mouth 2 (two) times daily.   furosemide  (LASIX ) 20 MG tablet Take 1 tablet (20 mg total) by mouth daily.   Magnesium  Citrate 200 MG TABS Take 200 mg by mouth daily.   melatonin 5 MG TABS Take 1 tablet (5 mg total) by mouth at bedtime as needed.   metoprolol  succinate (TOPROL -XL) 25 MG 24 hr tablet Take 1.5 tablets (37.5 mg total) by mouth daily.   mirtazapine  (REMERON ) 15 MG tablet TAKE 1 TABLET BY MOUTH EVERYDAY AT BEDTIME   Multiple Vitamins-Minerals (PRESERVISION AREDS 2 PO) Take 1 tablet by mouth 2 (two) times daily.   ondansetron  (ZOFRAN ) 8 MG tablet Take 1 tablet (8 mg total) by mouth every 8 (eight) hours as needed (Nausea or vomiting). (Patient taking differently: Take 8 mg by mouth 2 (two) times daily.)   pantoprazole  (PROTONIX ) 40 MG tablet TAKE 1 TABLET BY MOUTH 2 TIMES DAILY. TAKE 30-60 MINUTES BEFORE BREAKFAST AND DINNER.   potassium chloride  SA (KLOR-CON  M20) 20 MEQ tablet Take 1 tablet (20 mEq total) by mouth daily.   prochlorperazine  (COMPAZINE ) 5 MG tablet Take 5 mg by mouth every 6 (six) hours as needed for nausea or vomiting.   senna-docusate (SENOKOT-S) 8.6-50 MG tablet Take 2 tablets by mouth daily as needed.   spironolactone (ALDACTONE) 25 MG tablet Take 0.5 tablets (12.5 mg total) by mouth daily.   [DISCONTINUED] furosemide  (LASIX ) 40 MG tablet Take 1 tablet (40 mg total) by mouth daily. Can take an additional an additional tablet as needed for weight gain of 3lbs in a day or 5lbs in a week   [DISCONTINUED] metoprolol  succinate  (TOPROL -XL) 25 MG 24 hr tablet Take 1 tablet (25 mg total) by mouth at bedtime.     Allergies:   Patient has no known allergies.   Social History   Socioeconomic History   Marital status: Divorced    Spouse name: Not on file   Number of children: Not on file   Years of education: Not on file   Highest education level: Not on file  Occupational History   Not on file  Tobacco Use   Smoking status: Former    Current packs/day: 0.00    Average packs/day: 0.5 packs/day for 4.0 years (2.0 ttl pk-yrs)    Types: Cigarettes    Start date: 06/26/1964    Quit date: 06/26/1968    Years since quitting: 55.8   Smokeless tobacco: Never  Vaping  Use   Vaping status: Never Used  Substance and Sexual Activity   Alcohol  use: Yes    Alcohol /week: 1.0 standard drink of alcohol     Types: 1 Cans of beer per week    Comment: 1 per week per pt   Drug use: No   Sexual activity: Not on file  Other Topics Concern   Not on file  Social History Narrative   [name] is daughter zlata, alcaide will,  full code ( reviewed 61)   Social Drivers of Health   Financial Resource Strain: Low Risk  (10/09/2023)   Received from [facility]   Overall Financial Resource Strain (CARDIA)    Difficulty of Paying Living Expenses: Not hard at all  Food Insecurity: No Food Insecurity (03/09/2024)   Hunger Vital Sign    Worried About Running Out of Food in the Last Year: Never true    Ran Out of Food in the Last Year: Never true  Transportation Needs: No Transportation Needs (03/09/2024)   PRAPARE - Administrator, Civil Service (Medical): No    Lack of Transportation (Non-Medical): No  Physical Activity: Not on file  Stress: Not on file  Social Connections: Moderately Integrated (03/09/2024)   Social Connection and Isolation Panel    Frequency of Communication with Friends and Family: More than three times a week    Frequency of Social Gatherings with Friends and Family: Twice a  week    Attends Religious Services: 1 to 4 times per year    Active Member of [employer] or Organizations: Yes    Attends Engineer, Structural: 1 to 4 times per year    Marital Status: Divorced     Family History: The patient's family history includes Colon cancer (age of onset: 35) in her paternal aunt; Diabetes in her father; Heart disease in her brother, father, and mother; Heart failure in her father; Hypertension in her brother; Kidney disease in her father; Skin cancer in her brother and father; Throat cancer in her father. There is no history of Stomach cancer, Esophageal cancer, or Rectal cancer.  ROS:   Please see the history of present illness.     All other systems reviewed and are negative.  EKGs/Labs/Other Studies Reviewed:    The following studies were reviewed today:       Recent Labs: 03/10/2024: Pro Brain Natriuretic Peptide 16,972.0 04/16/2024: ALT 13; BUN 18; Creatinine 0.99; Hemoglobin 10.6; Magnesium  1.7; Platelet Count 121; Potassium 3.0; Sodium 140  Recent Lipid Panel    Component Value Date/Time   CHOL 110 08/08/2013 1127   TRIG 82.0 08/08/2013 1127   HDL 36.50 (L) 08/08/2013 1127   CHOLHDL 3 08/08/2013 1127   VLDL 16.4 08/08/2013 1127   LDLCALC 57 08/08/2013 1127     Risk Assessment/Calculations:                Physical Exam:    VS:  BP 126/74   Pulse (!) 103   Ht 5' 2 (1.575 m)   Wt 150 lb 12.8 oz (68.4 kg)   SpO2 98%   BMI 27.58 kg/m     Wt Readings from Last 3 Encounters:  04/22/24 150 lb 12.8 oz (68.4 kg)  04/16/24 151 lb 12.8 oz (68.9 kg)  04/04/24 152 lb (68.9 kg)     GEN:  Well nourished, well developed in no acute distress HEENT: Normal NECK: No JVD; No carotid bruits LYMPHATICS: No lymphadenopathy CARDIAC: RRR, no murmurs, rubs, gallops RESPIRATORY:  Clear to auscultation without rales, wheezing or rhonchi  ABDOMEN: Soft, non-tender, non-distended MUSCULOSKELETAL:  No edema; No deformity  SKIN: Warm and  dry NEUROLOGIC:  Alert and oriented x 3 PSYCHIATRIC:  Normal affect   ASSESSMENT:    1. Chronic systolic heart failure (HCC)   2. Moderate mitral regurgitation   3. Primary hypertension   4. Hyperlipidemia with target LDL less than 70   5. Multiple myeloma in relapse (HCC)   6. Paroxysmal SVT (supraventricular tachycardia)    PLAN:    In order of problems listed above:  Chronic systolic heart failure Moderate MR -LVEF 35-40% with moderate MR-03/11/2024 - GDMT: 25 mg Toprol  - I restarted 40 mg Lasix  with 20 mEq of potassium at last visit 1 month ago - Plan to repeat an echocardiogram next week -Initially plan for CT coronary with 10 mg Corlanor - not yet scheduled -BP is improving and I will attempt to improve GDMT: Reduce Lasix  to 20 mg daily, continue potassium 20 mEq daily, increase Toprol  to 37.5 mg at night, start 12.5 mg spironolactone -She is due to have labs next Wednesday, I will follow these -We also discussed that she can take an extra 20 mg of Lasix  should she have increased edema or shortness of breath -Her shortness of breath with standing is likely related to deconditioning but we will continue to follow this closely   Hypokalemia - K 3.0 with 40 mg of Lasix  and 20 mEq potassium - I initially plan to increase her potassium supplement, however given her blood pressure I believe I am able to add 12.5 mg spironolactone and continue 20 mEq potassium along with 20 mg of Lasix  -- Will watch for her routine cancer labs next week - Given magnesium  1.7, I have also recommended OTC magnesium  citrate 200 mg daily to help support potassium   Hypertension - Managed in the context of CHF   Paroxysmal SVT - Continues to do well on Toprol  - moved to evening dosing for mild hypotension during the day    Follow-up in approximately 1 month with me.      Medication Adjustments/Labs and Tests Ordered: Current medicines are reviewed at length with the patient today.   Concerns regarding medicines are outlined above.  No orders of the defined types were placed in this encounter.  Meds ordered this encounter  Medications   metoprolol  succinate (TOPROL -XL) 25 MG 24 hr tablet    Sig: Take 1.5 tablets (37.5 mg total) by mouth daily.    Dispense:  135 tablet    Refill:  3    Dose change (increased on 04/22/24).   furosemide  (LASIX ) 20 MG tablet    Sig: Take 1 tablet (20 mg total) by mouth daily.    Dispense:  90 tablet    Refill:  3    Dose change (decreased on 04/22/24).   spironolactone (ALDACTONE) 25 MG tablet    Sig: Take 0.5 tablets (12.5 mg total) by mouth daily.    Dispense:  90 tablet    Refill:  3   Magnesium  Citrate 200 MG TABS    Sig: Take 200 mg by mouth daily.    Dispense:  90 tablet    Refill:  3    Patient Instructions  Medication Instructions:  Your physician has recommended you make the following change in your medication:   1) INCREASE metoprolol  succinate (Toprol  XL) to 37.5 mg daily in the evening 2) DECREASE furosemide  (Lasix ) to 20 mg daily 3) CONTINUE potassium 20  mEq daily 4) START magnesium  citrate 200 mg daily 5) START spironolactone (Aldactone) 12.5 mg daily  *If you need a refill on your cardiac medications before your next appointment, please call your pharmacy*  Lab Work: BMP, BNP, Magnesium  to be drawn at cancer center. If you have labs (blood work) drawn today and your tests are completely normal, you will receive your results only by: MyChart Message (if you have MyChart) OR A paper copy in the mail If you have any lab test that is abnormal or we need to change your treatment, we will call you to review the results.  Follow-Up: At [facility], you and your health needs are our priority.  As part of our continuing mission to provide you with exceptional heart care, our providers are all part of one team.  This team includes your primary Cardiologist (physician) and Advanced Practice Providers or APPs  (Physician Assistants and Nurse Practitioners) who all work together to provide you with the care you need, when you need it.  Your next appointment:   Next month  Provider:   Jon Hails, PA-C       We recommend signing up for the patient portal called MyChart.  Sign up information is provided on this After Visit Summary.  MyChart is used to connect with patients for Virtual Visits (Telemedicine).  Patients are able to view lab/test results, encounter notes, upcoming appointments, etc.  Non-urgent messages can be sent to your provider as well.    To learn more about what you can do with MyChart, go to [email or web address].    Signed, Jon Nat Hails, [place]  04/22/2024 12:37 PM    [facility]

## 2024-04-22 ENCOUNTER — Encounter: Payer: Self-pay | Admitting: Physician Assistant

## 2024-04-22 ENCOUNTER — Ambulatory Visit: Attending: Physician Assistant | Admitting: Physician Assistant

## 2024-04-22 ENCOUNTER — Telehealth: Payer: Self-pay | Admitting: *Deleted

## 2024-04-22 ENCOUNTER — Other Ambulatory Visit: Payer: Self-pay

## 2024-04-22 VITALS — BP 126/74 | HR 103 | Ht 62.0 in | Wt 150.8 lb

## 2024-04-22 DIAGNOSIS — I10 Essential (primary) hypertension: Secondary | ICD-10-CM | POA: Diagnosis not present

## 2024-04-22 DIAGNOSIS — I471 Supraventricular tachycardia, unspecified: Secondary | ICD-10-CM

## 2024-04-22 DIAGNOSIS — I340 Nonrheumatic mitral (valve) insufficiency: Secondary | ICD-10-CM | POA: Diagnosis not present

## 2024-04-22 DIAGNOSIS — E785 Hyperlipidemia, unspecified: Secondary | ICD-10-CM

## 2024-04-22 DIAGNOSIS — I5022 Chronic systolic (congestive) heart failure: Secondary | ICD-10-CM

## 2024-04-22 DIAGNOSIS — C9002 Multiple myeloma in relapse: Secondary | ICD-10-CM

## 2024-04-22 MED ORDER — FUROSEMIDE 20 MG PO TABS: 20.0000 mg | ORAL_TABLET | Freq: Every day | ORAL | 3 refills | Status: DC

## 2024-04-22 MED ORDER — SPIRONOLACTONE 25 MG PO TABS: 12.5000 mg | ORAL_TABLET | Freq: Every day | ORAL | 3 refills | Status: DC

## 2024-04-22 MED ORDER — METOPROLOL SUCCINATE ER 25 MG PO TB24: 37.5000 mg | ORAL_TABLET | Freq: Every day | ORAL | 3 refills | Status: AC

## 2024-04-22 MED ORDER — MAGNESIUM CITRATE 200 MG PO TABS: 200.0000 mg | ORAL_TABLET | Freq: Every day | ORAL | 3 refills | Status: DC

## 2024-04-22 NOTE — Telephone Encounter (Signed)
 Faxed signed orders for OT to Home Health  Fax confirmation received

## 2024-04-22 NOTE — Patient Instructions (Addendum)
 Medication Instructions:  Your physician has recommended you make the following change in your medication:   1) INCREASE metoprolol  succinate (Toprol  XL) to 37.5 mg daily in the evening 2) DECREASE furosemide  (Lasix ) to 20 mg daily 3) CONTINUE potassium 20 mEq daily 4) START magnesium  citrate 200 mg daily 5) START spironolactone (Aldactone) 12.5 mg daily  *If you need a refill on your cardiac medications before your next appointment, please call your pharmacy*  Lab Work: BMP, BNP, Magnesium  to be drawn at cancer center. If you have labs (blood work) drawn today and your tests are completely normal, you will receive your results only by: MyChart Message (if you have MyChart) OR A paper copy in the mail If you have any lab test that is abnormal or we need to change your treatment, we will call you to review the results.  Follow-Up: At [facility], you and your health needs are our priority.  As part of our continuing mission to provide you with exceptional heart care, our providers are all part of one team.  This team includes your primary Cardiologist (physician) and Advanced Practice Providers or APPs (Physician Assistants and Nurse Practitioners) who all work together to provide you with the care you need, when you need it.  Your next appointment:   Next month  Provider:   Jon Hails, PA-C       We recommend signing up for the patient portal called MyChart.  Sign up information is provided on this After Visit Summary.  MyChart is used to connect with patients for Virtual Visits (Telemedicine).  Patients are able to view lab/test results, encounter notes, upcoming appointments, etc.  Non-urgent messages can be sent to your provider as well.    To learn more about what you can do with MyChart, go to [email or web address].

## 2024-04-23 ENCOUNTER — Ambulatory Visit

## 2024-04-23 ENCOUNTER — Ambulatory Visit: Admitting: Hematology and Oncology

## 2024-04-23 ENCOUNTER — Other Ambulatory Visit

## 2024-04-23 ENCOUNTER — Other Ambulatory Visit: Payer: Self-pay

## 2024-04-25 ENCOUNTER — Ambulatory Visit (HOSPITAL_COMMUNITY)

## 2024-04-28 ENCOUNTER — Other Ambulatory Visit: Payer: Self-pay

## 2024-04-30 ENCOUNTER — Other Ambulatory Visit: Payer: Self-pay | Admitting: *Deleted

## 2024-04-30 ENCOUNTER — Inpatient Hospital Stay

## 2024-04-30 ENCOUNTER — Inpatient Hospital Stay (HOSPITAL_BASED_OUTPATIENT_CLINIC_OR_DEPARTMENT_OTHER): Admitting: Hematology and Oncology

## 2024-04-30 ENCOUNTER — Other Ambulatory Visit: Payer: Self-pay

## 2024-04-30 ENCOUNTER — Inpatient Hospital Stay: Admitting: Hematology and Oncology

## 2024-04-30 ENCOUNTER — Inpatient Hospital Stay: Attending: Hematology and Oncology

## 2024-04-30 VITALS — BP 115/66 | HR 63 | Temp 98.9°F | Resp 16 | Wt 152.0 lb

## 2024-04-30 DIAGNOSIS — C9001 Multiple myeloma in remission: Secondary | ICD-10-CM

## 2024-04-30 DIAGNOSIS — Z87891 Personal history of nicotine dependence: Secondary | ICD-10-CM | POA: Insufficient documentation

## 2024-04-30 DIAGNOSIS — D801 Nonfamilial hypogammaglobulinemia: Secondary | ICD-10-CM

## 2024-04-30 DIAGNOSIS — Z808 Family history of malignant neoplasm of other organs or systems: Secondary | ICD-10-CM | POA: Diagnosis not present

## 2024-04-30 DIAGNOSIS — C9002 Multiple myeloma in relapse: Secondary | ICD-10-CM | POA: Insufficient documentation

## 2024-04-30 DIAGNOSIS — Z23 Encounter for immunization: Secondary | ICD-10-CM | POA: Diagnosis not present

## 2024-04-30 DIAGNOSIS — Z9484 Stem cells transplant status: Secondary | ICD-10-CM | POA: Insufficient documentation

## 2024-04-30 DIAGNOSIS — Z800 Family history of malignant neoplasm of digestive organs: Secondary | ICD-10-CM | POA: Diagnosis not present

## 2024-04-30 LAB — CBC WITH DIFFERENTIAL (CANCER CENTER ONLY)
Abs Immature Granulocytes: 0.04 K/uL (ref 0.00–0.07)
Basophils Absolute: 0 K/uL (ref 0.0–0.1)
Basophils Relative: 0 %
Eosinophils Absolute: 0 K/uL (ref 0.0–0.5)
Eosinophils Relative: 0 %
HCT: 26.8 % — ABNORMAL LOW (ref 36.0–46.0)
Hemoglobin: 8.7 g/dL — ABNORMAL LOW (ref 12.0–15.0)
Immature Granulocytes: 1 %
Lymphocytes Relative: 51 %
Lymphs Abs: 2.3 K/uL (ref 0.7–4.0)
MCH: 34.9 pg — ABNORMAL HIGH (ref 26.0–34.0)
MCHC: 32.5 g/dL (ref 30.0–36.0)
MCV: 107.6 fL — ABNORMAL HIGH (ref 80.0–100.0)
Monocytes Absolute: 0.6 K/uL (ref 0.1–1.0)
Monocytes Relative: 12 %
Neutro Abs: 1.6 K/uL — ABNORMAL LOW (ref 1.7–7.7)
Neutrophils Relative %: 36 %
Platelet Count: 143 K/uL — ABNORMAL LOW (ref 150–400)
RBC: 2.49 MIL/uL — ABNORMAL LOW (ref 3.87–5.11)
RDW: 17.2 % — ABNORMAL HIGH (ref 11.5–15.5)
WBC Count: 4.5 K/uL (ref 4.0–10.5)
nRBC: 0 % (ref 0.0–0.2)

## 2024-04-30 LAB — CMP (CANCER CENTER ONLY)
ALT: 9 U/L (ref 0–44)
AST: 18 U/L (ref 15–41)
Albumin: 4 g/dL (ref 3.5–5.0)
Alkaline Phosphatase: 77 U/L (ref 38–126)
Anion gap: 9 (ref 5–15)
BUN: 11 mg/dL (ref 8–23)
CO2: 28 mmol/L (ref 22–32)
Calcium: 9.1 mg/dL (ref 8.9–10.3)
Chloride: 101 mmol/L (ref 98–111)
Creatinine: 1.08 mg/dL — ABNORMAL HIGH (ref 0.44–1.00)
GFR, Estimated: 53 mL/min — ABNORMAL LOW (ref >60)
Glucose, Bld: 109 mg/dL — ABNORMAL HIGH (ref 70–99)
Potassium: 3.5 mmol/L (ref 3.5–5.1)
Sodium: 138 mmol/L (ref 135–145)
Total Bilirubin: 0.3 mg/dL (ref 0.0–1.2)
Total Protein: 6.4 g/dL — ABNORMAL LOW (ref 6.5–8.1)

## 2024-04-30 LAB — MAGNESIUM: Magnesium: 1.9 mg/dL (ref 1.7–2.4)

## 2024-04-30 MED ORDER — ELRANATAMAB-BCMM 76 MG/1.9ML ~~LOC~~ SOLN
76.0000 mg | Freq: Once | SUBCUTANEOUS | Status: AC
  Administered 2024-04-30: 76 mg via SUBCUTANEOUS
  Filled 2024-04-30: qty 1.9

## 2024-04-30 NOTE — Progress Notes (Unsigned)
 [facility] Telephone:([phone]   [phone]  PROGRESS NOTE  Patient Care Team: Patient, [name] as PCP - General (General Practice) Jean Deatrice LABOR, MD as PCP - Cardiology (Cardiology) Jean Liborio Backers, MD as Referring Physician (Hematology and Oncology) Jean File, MD as Consulting Physician (Internal Medicine) Jean Davidson, Jean SAILOR, MD as Consulting Physician (Infectious Diseases) Jean Maranda Foots An, MD as Consulting Physician (Hematology and Oncology) Jean Lenon Irving, NP as Nurse Practitioner (Hematology and Oncology) Jean Norleen ONEIDA MADISON, MD as Consulting Physician (Hematology and Oncology) Jean Davidson, Jean Davidson, Jean Davidson as Physician Assistant (Cardiology)  Hematological/Oncological History # Relapsed/Refractory IgA Lambda Multiple Myeloma, In Remission 2016: Underwent autologous stem cell transplant 2017: Underwent allogenic stem cell transplant 10/16/2022: Completed CAR-T therapy with [facility] 09/2023: Started on Elrexfio  due to progression of disease 01/02/2024: Last visit at [facility] where induction therapy was completed 01/09/2024: establish care with Dr. Federico.   Interval History:  Jean Davidson 78 y.o. female with medical history significant for relapsed/refractory multiple myeloma who presents for a follow up visit. The patient's last visit was on 04/16/2024 and since then she has continued bispecific therapy. In the interim since the last visit she has continued maintenance Elrexfio  at [facility].    Jean Davidson reports that she is still weak after her hospitalization but feels that she is recovering well.  She notes that she is looking forward to flying to [place]  for Christmas.  She has had no recent issues with nausea or vomiting but she reports that her bowels are still unreliable.  She reports that he goes back and forth between constipation and loose stools.  She notes her appetite is good.  Her energy is  adequate for her to perform her day-to-day tasks but she has to pace herself.  She notes she has had no lightheadedness, dizziness, shortness of breath without exertion.  She reports that she did have blood in the urine during hospitalization but is not having bleeding anywhere else such as nosebleeds, gum bleeding, or blood in the stool.  She denies any fevers, chills, sweats.  Full 10 point ROS otherwise negative.  Overall she is willing and able to continue with Elrexfio  today.   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   Osteopenia    Spinal stenosis in cervical region    Varicose veins of lower extremities with inflammation     SURGICAL HISTORY: Past Surgical History:  Procedure Laterality Date   ANKLE SURGERY Left    BLADDER SUSPENSION  1988/1989   x2   COLONOSCOPY     ESOPHAGOGASTRODUODENOSCOPY     IR IMAGING GUIDED PORT INSERTION  03/01/2018   LIMBAL STEM CELL TRANSPLANT     x2   PORT-A-CATH REMOVAL     SHOULDER SURGERY Right 04/06/2013   right   UPPER GASTROINTESTINAL ENDOSCOPY  SOCIAL HISTORY: Social History   Socioeconomic History   Marital status: Divorced    Spouse name: Not on Davidson   Number of children: Not on Davidson   Years of education: Not on Davidson   Highest education level: Not on Davidson  Occupational History   Not on Davidson  Tobacco Use   Smoking status: Former    Current packs/day: 0.00    Average packs/day: 0.5 packs/day for 4.0 years (2.0  ttl pk-yrs)    Types: Cigarettes    Start date: 06/26/1964    Quit date: 06/26/1968    Years since quitting: 55.8   Smokeless tobacco: Never  Vaping Use   Vaping status: Never Used  Substance and Sexual Activity   Alcohol  use: Yes    Alcohol /week: 1.0 standard drink of alcohol     Types: 1 Cans of beer per week    Comment: 1 per week per pt   Drug use: No   Sexual activity: Not on Davidson  Other Topics Concern   Not on Davidson  Social History Narrative   Jean Davidson is daughter clarine, elrod will,  full code ( reviewed 36)   Social Drivers of Health   Financial Resource Strain: Low Risk  (10/09/2023)   Received from [facility]   Overall Financial Resource Strain (CARDIA)    Difficulty of Paying Living Expenses: Not hard at all  Food Insecurity: No Food Insecurity (03/09/2024)   Hunger Vital Sign    Worried About Running Out of Food in the Last Year: Never true    Ran Out of Food in the Last Year: Never true  Transportation Needs: No Transportation Needs (03/09/2024)   PRAPARE - Administrator, Civil Service (Medical): No    Lack of Transportation (Non-Medical): No  Physical Activity: Not on Davidson  Stress: Not on Davidson  Social Connections: Moderately Integrated (03/09/2024)   Social Connection and Isolation Panel    Frequency of Communication with Friends and Family: More than three times a week    Frequency of Social Gatherings with Friends and Family: Twice a week    Attends Religious Services: 1 to 4 times per year    Active Member of [employer] or Organizations: Yes    Attends Banker Meetings: 1 to 4 times per year    Marital Status: Divorced  Catering Manager Violence: Not At Risk (03/09/2024)   Humiliation, Afraid, Rape, and Kick questionnaire    Fear of Current or Ex-Partner: No    Emotionally Abused: No    Physically Abused: No    Sexually Abused: No    FAMILY HISTORY: Family History  Problem Relation Age of Onset   Heart disease  Mother    Heart failure Father    Heart disease Father    Throat cancer Father    Skin cancer Father    Diabetes Father    Kidney disease Father    Hypertension Brother    Heart disease Brother        congenital shunt, ?    Colon cancer Paternal Aunt 68   Skin cancer Brother    Stomach cancer Neg Hx    Esophageal cancer Neg Hx    Rectal cancer Neg Hx     ALLERGIES:  has no known allergies.  MEDICATIONS:  Current Outpatient Medications  Medication Sig Dispense Refill   acyclovir  (ZOVIRAX ) 400 MG tablet Take 1 tablet (400 mg total) by mouth 2 (two) times daily. 180 tablet 1   aspirin   EC 81 MG tablet Take 81 mg by mouth daily. Swallow whole.     atovaquone  (MEPRON ) 750 MG/5ML suspension TAKE 10 MLS (1,500 MG TOTAL) BY MOUTH 2 (TWO) TIMES DAILY. 420 mL 3   cephALEXin (KEFLEX) 250 MG capsule Take by mouth daily. (Patient not taking: Reported on 04/22/2024)     cholecalciferol  (VITAMIN D3) 25 MCG (1000 UNIT) tablet Take 4,000 Units by mouth daily. Instructed to increase to 4,000 units daily     ciprofloxacin  (CIPRO ) 500 MG tablet Take 500 mg by mouth 2 (two) times daily.     furosemide  (LASIX ) 20 MG tablet Take 1 tablet (20 mg total) by mouth daily. 90 tablet 3   Magnesium  Citrate 200 MG TABS Take 200 mg by mouth daily. 90 tablet 3   melatonin 5 MG TABS Take 1 tablet (5 mg total) by mouth at bedtime as needed. (Patient not taking: Reported on 04/30/2024)     metoprolol  succinate (TOPROL -XL) 25 MG 24 hr tablet Take 1.5 tablets (37.5 mg total) by mouth daily. 135 tablet 3   mirtazapine  (REMERON ) 15 MG tablet TAKE 1 TABLET BY MOUTH EVERYDAY AT BEDTIME 90 tablet 1   Multiple Vitamins-Minerals (PRESERVISION AREDS 2 PO) Take 1 tablet by mouth 2 (two) times daily.     ondansetron  (ZOFRAN ) 8 MG tablet Take 1 tablet (8 mg total) by mouth every 8 (eight) hours as needed (Nausea or vomiting). (Patient taking differently: Take 8 mg by mouth 2 (two) times daily.) 60 tablet 1   pantoprazole  (PROTONIX )  40 MG tablet TAKE 1 TABLET BY MOUTH 2 TIMES DAILY. TAKE 30-60 MINUTES BEFORE BREAKFAST AND DINNER. 180 tablet 0   potassium chloride  SA (KLOR-CON  M20) 20 MEQ tablet Take 1 tablet (20 mEq total) by mouth daily. 90 tablet 1   prochlorperazine  (COMPAZINE ) 5 MG tablet Take 5 mg by mouth every 6 (six) hours as needed for nausea or vomiting.     senna-docusate (SENOKOT-S) 8.6-50 MG tablet Take 2 tablets by mouth daily as needed.     spironolactone (ALDACTONE) 25 MG tablet Take 0.5 tablets (12.5 mg total) by mouth daily. 90 tablet 3   No current facility-administered medications for this visit.    REVIEW OF SYSTEMS:   Constitutional: ( - ) fevers, ( - )  chills , ( - ) night sweats Eyes: ( - ) blurriness of vision, ( - ) double vision, ( - ) watery eyes Ears, nose, mouth, throat, and face: ( - ) mucositis, ( - ) sore throat Respiratory: ( - ) cough, ( - ) dyspnea, ( - ) wheezes Cardiovascular: ( - ) palpitation, ( - ) chest discomfort, ( - ) lower extremity swelling Gastrointestinal:  ( - ) nausea, ( - ) heartburn, ( - ) change in bowel habits Skin: ( - ) abnormal skin rashes Lymphatics: ( - ) new lymphadenopathy, ( - ) easy bruising Neurological: ( - ) numbness, ( - ) tingling, ( - ) new weaknesses Behavioral/Psych: ( - ) mood change, ( - ) new changes  All other systems were reviewed with the patient and are negative.  PHYSICAL EXAMINATION: ECOG PERFORMANCE STATUS: 1 - Symptomatic but completely ambulatory  There were no vitals filed for this visit.  There were no vitals filed for this visit.  TELEPHONE VISIT, no physical exam.   LABORATORY DATA:  I have reviewed the data as listed    Latest Ref Rng & Units 04/30/2024   12:37 PM 04/16/2024    8:32 AM 04/06/2024  1:29 PM  CBC  WBC 4.0 - 10.5 K/uL 4.5  4.2  3.5   Hemoglobin 12.0 - 15.0 g/dL 8.7  89.3  8.9   Hematocrit 36.0 - 46.0 % 26.8  32.5  27.9   Platelets 150 - 400 K/uL 143  121  85        Latest Ref Rng & Units 04/30/2024    12:37 PM 04/16/2024    8:32 AM 04/02/2024    1:32 PM  CMP  Glucose 70 - 99 mg/dL 890  851  896   BUN 8 - 23 mg/dL 11  18  15    Creatinine 0.44 - 1.00 mg/dL 8.91  9.00  8.88   Sodium 135 - 145 mmol/L 138  140  139   Potassium 3.5 - 5.1 mmol/L 3.5  3.0  4.2   Chloride 98 - 111 mmol/L 101  102  102   CO2 22 - 32 mmol/L 28  29  28    Calcium  8.9 - 10.3 mg/dL 9.1  9.5  9.3   Total Protein 6.5 - 8.1 g/dL 6.4  6.8  7.2   Total Bilirubin 0.0 - 1.2 mg/dL 0.3  0.5  0.4   Alkaline Phos 38 - 126 U/L 77  80  74   AST 15 - 41 U/L 18  22  25    ALT 0 - 44 U/L 9  13  18      Lab Results  Component Value Date   MPROTEIN Not Observed 03/26/2024   MPROTEIN Not Observed 03/15/2024   MPROTEIN Not Observed 02/27/2024   Lab Results  Component Value Date   KPAFRELGTCHN <0.7 (L) 03/26/2024   KPAFRELGTCHN <0.7 (L) 03/15/2024   KPAFRELGTCHN <0.7 (L) 02/27/2024   LAMBDASER <1.5 (L) 03/26/2024   LAMBDASER <1.5 (L) 03/15/2024   LAMBDASER <1.5 (L) 02/27/2024   KAPLAMBRATIO UNABLE TO CALCULATE 03/26/2024   KAPLAMBRATIO UNABLE TO CALCULATE 03/15/2024   KAPLAMBRATIO UNABLE TO CALCULATE 02/27/2024     RADIOGRAPHIC STUDIES: No results found.  ASSESSMENT & PLAN Jean Davidson 78 y.o. female with medical history significant for relapsed/refractory multiple myeloma who presents for a follow up visit.   # Relapsed/Refractory IgA Lambda Multiple Myeloma, In Remission -- Patient is undergone numerous prior therapies including autosomal stem cell transplant, allogenic stem cell transplant, CAR-T therapy, and now bispecific therapy --Patient completed her induction of bispecific therapy at [facility] -- Patient presents to continue maintenance therapy. PLAN: --Labs today show white blood cell 4.5, hemoglobin 8.7, MCV 107.6, platelets 143. LFTs and creatinine adequate for continued treatment --Okay to proceed with cycle 7-day 15 of Elrexfio  today --Plan for IVIG to be administered if IgG is less  than 400.  Patient above target at last check on 03/26/2024.  -- Will plan for q 2 weekly maintenance Elrexfio  with clinic visits every treatment.   #Supportive Care -- zofran  8mg  q8H PRN and compazine  10mg  PO q6H for nausea -- acyclovir  400mg  PO BID for VCZ prophylaxis --Continue atovaquone  1500 mg twice daily -- IVIG to be administered when IgG are less than 400 -- EMLA  cream for port -- no pain medication required at this time.   No orders of the defined types were placed in this encounter.   All questions were answered. The patient knows to call the clinic with any problems, questions or concerns.  A total of more than 30 minutes were spent on this encounter with face-to-face time and non-face-to-face time, including preparing to see the patient, ordering tests and/or  medications, counseling the patient and coordination of care as outlined above.   Norleen IVAR Kidney, MD Department of Hematology/Oncology [facility] Phone: [phone] Pager: [phone] Email: [email or web address]  05/01/2024 9:03 AM

## 2024-04-30 NOTE — Patient Instructions (Signed)
 [facility] - A DEPT OF [facility]  Discharge Instructions: Thank you for choosing [facility] to provide your oncology and hematology care.   If you have a lab appointment with the Cancer Center, please go directly to the Cancer Center and check in at the registration area.   Wear comfortable clothing and clothing appropriate for easy access to any Portacath or PICC line.   We strive to give you quality time with your provider. You may need to reschedule your appointment if you arrive late (15 or more minutes).  Arriving late affects you and other patients whose appointments are after yours.  Also, if you miss three or more appointments without notifying the office, you may be dismissed from the clinic at the provider's discretion.      For prescription refill requests, have your pharmacy contact our office and allow 72 hours for refills to be completed.    Today you received the following chemotherapy and/or immunotherapy agents elrexfio       To help prevent nausea and vomiting after your treatment, we encourage you to take your nausea medication as directed.  BELOW ARE SYMPTOMS THAT SHOULD BE REPORTED IMMEDIATELY: *FEVER GREATER THAN 100.4 F (38 C) OR HIGHER *CHILLS OR SWEATING *NAUSEA AND VOMITING THAT IS NOT CONTROLLED WITH YOUR NAUSEA MEDICATION *UNUSUAL SHORTNESS OF BREATH *UNUSUAL BRUISING OR BLEEDING *URINARY PROBLEMS (pain or burning when urinating, or frequent urination) *BOWEL PROBLEMS (unusual diarrhea, constipation, pain near the anus) TENDERNESS IN MOUTH AND THROAT WITH OR WITHOUT PRESENCE OF ULCERS (sore throat, sores in mouth, or a toothache) UNUSUAL RASH, SWELLING OR PAIN  UNUSUAL VAGINAL DISCHARGE OR ITCHING   Items with * indicate a potential emergency and should be followed up as soon as possible or go to the Emergency Department if any problems should occur.  Please show the CHEMOTHERAPY ALERT CARD or IMMUNOTHERAPY  ALERT CARD at check-in to the Emergency Department and triage nurse.  Should you have questions after your visit or need to cancel or reschedule your appointment, please contact [facility] - A DEPT OF [facility]  Dept: [phone]  and follow the prompts.  Office hours are 8:00 a.m. to 4:30 p.m. Monday - Friday. Please note that voicemails left after 4:00 p.m. may not be returned until the following business day.  We are closed weekends and major holidays. You have access to a nurse at all times for urgent questions. Please call the main number to the clinic Dept: [phone] and follow the prompts.   For any non-urgent questions, you may also contact your provider using MyChart. We now offer e-Visits for anyone 39 and older to request care online for non-urgent symptoms. For details visit [email or web address].   Also download the MyChart app! Go to the app store, search MyChart, open the app, select [facility], and log in with your MyChart username and password.

## 2024-05-01 ENCOUNTER — Encounter: Payer: Self-pay | Admitting: Hematology and Oncology

## 2024-05-01 ENCOUNTER — Ambulatory Visit: Admitting: Physician Assistant

## 2024-05-02 ENCOUNTER — Ambulatory Visit (HOSPITAL_COMMUNITY)
Admission: RE | Admit: 2024-05-02 | Discharge: 2024-05-02 | Disposition: A | Discharge status: Home / Self Care | Source: Ambulatory Visit | Attending: Physician Assistant | Admitting: Physician Assistant

## 2024-05-02 DIAGNOSIS — I10 Essential (primary) hypertension: Secondary | ICD-10-CM | POA: Diagnosis not present

## 2024-05-02 DIAGNOSIS — I3481 Nonrheumatic mitral (valve) annulus calcification: Secondary | ICD-10-CM | POA: Insufficient documentation

## 2024-05-02 DIAGNOSIS — I5022 Chronic systolic (congestive) heart failure: Secondary | ICD-10-CM | POA: Diagnosis not present

## 2024-05-02 DIAGNOSIS — I110 Hypertensive heart disease with heart failure: Secondary | ICD-10-CM | POA: Diagnosis not present

## 2024-05-02 DIAGNOSIS — I340 Nonrheumatic mitral (valve) insufficiency: Secondary | ICD-10-CM | POA: Insufficient documentation

## 2024-05-02 DIAGNOSIS — I5042 Chronic combined systolic (congestive) and diastolic (congestive) heart failure: Secondary | ICD-10-CM | POA: Diagnosis present

## 2024-05-02 DIAGNOSIS — I471 Supraventricular tachycardia, unspecified: Secondary | ICD-10-CM | POA: Insufficient documentation

## 2024-05-02 LAB — ECHOCARDIOGRAM COMPLETE
Area-P 1/2: 4.06 cm2
S' Lateral: 2.1 cm

## 2024-05-05 ENCOUNTER — Other Ambulatory Visit: Payer: Self-pay | Admitting: *Deleted

## 2024-05-05 ENCOUNTER — Inpatient Hospital Stay

## 2024-05-05 ENCOUNTER — Telehealth: Payer: Self-pay | Admitting: *Deleted

## 2024-05-05 DIAGNOSIS — D539 Nutritional anemia, unspecified: Secondary | ICD-10-CM

## 2024-05-05 DIAGNOSIS — C9002 Multiple myeloma in relapse: Secondary | ICD-10-CM | POA: Diagnosis not present

## 2024-05-05 DIAGNOSIS — C9001 Multiple myeloma in remission: Secondary | ICD-10-CM

## 2024-05-05 LAB — CBC WITH DIFFERENTIAL (CANCER CENTER ONLY)
Abs Immature Granulocytes: 0.03 K/uL (ref 0.00–0.07)
Basophils Absolute: 0 K/uL (ref 0.0–0.1)
Basophils Relative: 0 %
Eosinophils Absolute: 0 K/uL (ref 0.0–0.5)
Eosinophils Relative: 0 %
HCT: 27.4 % — ABNORMAL LOW (ref 36.0–46.0)
Hemoglobin: 8.7 g/dL — ABNORMAL LOW (ref 12.0–15.0)
Immature Granulocytes: 1 %
Lymphocytes Relative: 50 %
Lymphs Abs: 2 K/uL (ref 0.7–4.0)
MCH: 34.8 pg — ABNORMAL HIGH (ref 26.0–34.0)
MCHC: 31.8 g/dL (ref 30.0–36.0)
MCV: 109.6 fL — ABNORMAL HIGH (ref 80.0–100.0)
Monocytes Absolute: 0.5 K/uL (ref 0.1–1.0)
Monocytes Relative: 12 %
Neutro Abs: 1.4 K/uL — ABNORMAL LOW (ref 1.7–7.7)
Neutrophils Relative %: 37 %
Platelet Count: 109 K/uL — ABNORMAL LOW (ref 150–400)
RBC: 2.5 MIL/uL — ABNORMAL LOW (ref 3.87–5.11)
RDW: 16.5 % — ABNORMAL HIGH (ref 11.5–15.5)
WBC Count: 3.9 K/uL — ABNORMAL LOW (ref 4.0–10.5)
nRBC: 0 % (ref 0.0–0.2)

## 2024-05-05 LAB — SAMPLE TO BLOOD BANK

## 2024-05-05 LAB — PREPARE RBC (CROSSMATCH)

## 2024-05-05 NOTE — Telephone Encounter (Signed)
 Received call from pt. She states her HGB is 8.7 from last week. She states she feels weak and her muscles ache. She would like to get a unit of blood this week. Discussed with dr. Federico. He is agreeable to giving her 1 unit of blood.  Made arrangements for lab for type and screen for today and transfuse tomorrow 05/06/24. Orders placed and pt aware.

## 2024-05-06 ENCOUNTER — Inpatient Hospital Stay

## 2024-05-06 DIAGNOSIS — D539 Nutritional anemia, unspecified: Secondary | ICD-10-CM

## 2024-05-06 DIAGNOSIS — C9002 Multiple myeloma in relapse: Secondary | ICD-10-CM | POA: Diagnosis not present

## 2024-05-06 DIAGNOSIS — C9001 Multiple myeloma in remission: Secondary | ICD-10-CM

## 2024-05-06 MED ORDER — ACETAMINOPHEN 325 MG PO TABS
650.0000 mg | ORAL_TABLET | Freq: Once | ORAL | Status: AC
  Administered 2024-05-06: 650 mg via ORAL
  Filled 2024-05-06: qty 2

## 2024-05-06 MED ORDER — SODIUM CHLORIDE 0.9% IV SOLUTION
250.0000 mL | INTRAVENOUS | Status: DC
  Administered 2024-05-06: 100 mL via INTRAVENOUS

## 2024-05-06 NOTE — Patient Instructions (Signed)

## 2024-05-07 ENCOUNTER — Ambulatory Visit: Admitting: Physician Assistant

## 2024-05-07 ENCOUNTER — Other Ambulatory Visit

## 2024-05-07 ENCOUNTER — Ambulatory Visit: Admitting: Hematology and Oncology

## 2024-05-07 ENCOUNTER — Ambulatory Visit

## 2024-05-07 LAB — BPAM RBC
Blood Product Expiration Date: 202512022359
ISSUE DATE / TIME: 202511111442
Unit Type and Rh: 202512022359
Unit Type and Rh: 6200

## 2024-05-07 LAB — TYPE AND SCREEN
ABO/RH(D): A POS
Antibody Screen: NEGATIVE
Unit division: 0

## 2024-05-08 ENCOUNTER — Ambulatory Visit: Payer: Self-pay | Admitting: Physician Assistant

## 2024-05-10 ENCOUNTER — Encounter: Payer: Self-pay | Admitting: Hematology and Oncology

## 2024-05-14 ENCOUNTER — Inpatient Hospital Stay

## 2024-05-14 ENCOUNTER — Encounter: Payer: Self-pay | Admitting: Hematology and Oncology

## 2024-05-14 VITALS — BP 123/74 | HR 97 | Temp 99.4°F | Resp 16 | Wt 144.8 lb

## 2024-05-14 DIAGNOSIS — C9001 Multiple myeloma in remission: Secondary | ICD-10-CM

## 2024-05-14 DIAGNOSIS — C9002 Multiple myeloma in relapse: Secondary | ICD-10-CM | POA: Diagnosis not present

## 2024-05-14 DIAGNOSIS — Z23 Encounter for immunization: Secondary | ICD-10-CM

## 2024-05-14 LAB — CBC WITH DIFFERENTIAL (CANCER CENTER ONLY)
Abs Immature Granulocytes: 0.05 K/uL (ref 0.00–0.07)
Basophils Absolute: 0 K/uL (ref 0.0–0.1)
Basophils Relative: 0 %
Eosinophils Absolute: 0 K/uL (ref 0.0–0.5)
Eosinophils Relative: 0 %
HCT: 34 % — ABNORMAL LOW (ref 36.0–46.0)
Hemoglobin: 11 g/dL — ABNORMAL LOW (ref 12.0–15.0)
Immature Granulocytes: 1 %
Lymphocytes Relative: 37 %
Lymphs Abs: 1.9 K/uL (ref 0.7–4.0)
MCH: 32.8 pg (ref 26.0–34.0)
MCHC: 32.4 g/dL (ref 30.0–36.0)
MCV: 101.5 fL — ABNORMAL HIGH (ref 80.0–100.0)
Monocytes Absolute: 0.7 K/uL (ref 0.1–1.0)
Monocytes Relative: 13 %
Neutro Abs: 2.5 K/uL (ref 1.7–7.7)
Neutrophils Relative %: 49 %
Platelet Count: 146 K/uL — ABNORMAL LOW (ref 150–400)
RBC: 3.35 MIL/uL — ABNORMAL LOW (ref 3.87–5.11)
RDW: 16.1 % — ABNORMAL HIGH (ref 11.5–15.5)
WBC Count: 5.2 K/uL (ref 4.0–10.5)
nRBC: 0 % (ref 0.0–0.2)

## 2024-05-14 LAB — MAGNESIUM: Magnesium: 2 mg/dL (ref 1.7–2.4)

## 2024-05-14 LAB — CMP (CANCER CENTER ONLY)
ALT: 7 U/L (ref 0–44)
AST: 26 U/L (ref 15–41)
Albumin: 4.3 g/dL (ref 3.5–5.0)
Alkaline Phosphatase: 89 U/L (ref 38–126)
Anion gap: 14 (ref 5–15)
BUN: 15 mg/dL (ref 8–23)
CO2: 24 mmol/L (ref 22–32)
Calcium: 9.5 mg/dL (ref 8.9–10.3)
Chloride: 98 mmol/L (ref 98–111)
Creatinine: 0.97 mg/dL (ref 0.44–1.00)
GFR, Estimated: 59 mL/min — ABNORMAL LOW (ref >60)
Glucose, Bld: 117 mg/dL — ABNORMAL HIGH (ref 70–99)
Potassium: 3.9 mmol/L (ref 3.5–5.1)
Sodium: 137 mmol/L (ref 135–145)
Total Bilirubin: 0.4 mg/dL (ref 0.0–1.2)
Total Protein: 6.9 g/dL (ref 6.5–8.1)

## 2024-05-14 MED ORDER — INFLUENZA VAC SPLIT HIGH-DOSE 0.5 ML IM SUSY
0.5000 mL | PREFILLED_SYRINGE | Freq: Once | INTRAMUSCULAR | Status: AC
  Administered 2024-05-14: 0.5 mL via INTRAMUSCULAR
  Filled 2024-05-14: qty 0.5

## 2024-05-14 MED ORDER — ELRANATAMAB-BCMM 76 MG/1.9ML ~~LOC~~ SOLN
76.0000 mg | Freq: Once | SUBCUTANEOUS | Status: AC
  Administered 2024-05-14: 76 mg via SUBCUTANEOUS
  Filled 2024-05-14: qty 1.9

## 2024-05-14 NOTE — Progress Notes (Signed)
 Patient requests to hold IVIG today to wait for IgG lab to result. This RN spoke with Dr Federico, since last IgG was 725 on 03/26/24, he is okay with holding IVIG today. Patient would like to receive in December before planned travels.   We will proceed with Elrexfio  only today. She has appointments scheduled for December already. Patient agreeable to plan.

## 2024-05-14 NOTE — Patient Instructions (Signed)
 [facility] - A DEPT OF [facility]  Discharge Instructions: Thank you for choosing [facility] to provide your oncology and hematology care.   If you have a lab appointment with the Cancer Center, please go directly to the Cancer Center and check in at the registration area.   Wear comfortable clothing and clothing appropriate for easy access to any Portacath or PICC line.   We strive to give you quality time with your provider. You may need to reschedule your appointment if you arrive late (15 or more minutes).  Arriving late affects you and other patients whose appointments are after yours.  Also, if you miss three or more appointments without notifying the office, you may be dismissed from the clinic at the provider's discretion.      For prescription refill requests, have your pharmacy contact our office and allow 72 hours for refills to be completed.    Today you received the following chemotherapy and/or immunotherapy agents elrexfio       To help prevent nausea and vomiting after your treatment, we encourage you to take your nausea medication as directed.  BELOW ARE SYMPTOMS THAT SHOULD BE REPORTED IMMEDIATELY: *FEVER GREATER THAN 100.4 F (38 C) OR HIGHER *CHILLS OR SWEATING *NAUSEA AND VOMITING THAT IS NOT CONTROLLED WITH YOUR NAUSEA MEDICATION *UNUSUAL SHORTNESS OF BREATH *UNUSUAL BRUISING OR BLEEDING *URINARY PROBLEMS (pain or burning when urinating, or frequent urination) *BOWEL PROBLEMS (unusual diarrhea, constipation, pain near the anus) TENDERNESS IN MOUTH AND THROAT WITH OR WITHOUT PRESENCE OF ULCERS (sore throat, sores in mouth, or a toothache) UNUSUAL RASH, SWELLING OR PAIN  UNUSUAL VAGINAL DISCHARGE OR ITCHING   Items with * indicate a potential emergency and should be followed up as soon as possible or go to the Emergency Department if any problems should occur.  Please show the CHEMOTHERAPY ALERT CARD or IMMUNOTHERAPY  ALERT CARD at check-in to the Emergency Department and triage nurse.  Should you have questions after your visit or need to cancel or reschedule your appointment, please contact [facility] - A DEPT OF [facility]  Dept: [phone]  and follow the prompts.  Office hours are 8:00 a.m. to 4:30 p.m. Monday - Friday. Please note that voicemails left after 4:00 p.m. may not be returned until the following business day.  We are closed weekends and major holidays. You have access to a nurse at all times for urgent questions. Please call the main number to the clinic Dept: [phone] and follow the prompts.   For any non-urgent questions, you may also contact your provider using MyChart. We now offer e-Visits for anyone 39 and older to request care online for non-urgent symptoms. For details visit [email or web address].   Also download the MyChart app! Go to the app store, search MyChart, open the app, select [facility], and log in with your MyChart username and password.

## 2024-05-15 LAB — KAPPA/LAMBDA LIGHT CHAINS
Kappa free light chain: <0.7 mg/L — ABNORMAL LOW (ref 3.3–19.4)
Kappa, lambda light chain ratio: UNDETERMINED
Lambda free light chains: <1.5 mg/L — ABNORMAL LOW (ref 5.7–26.3)

## 2024-05-17 NOTE — Progress Notes (Unsigned)
 Cardiology Office Note:    Date:  05/20/2024   ID:  Jean Davidson, DOB 1946-03-12, MRN [id number]  PCP:  Patient, [name]   [facility] Providers Cardiologist:  Deatrice Cage, MD Cardiology APP:  Madie Jon Garre, PA     Referring MD: No ref. provider found   Chief Complaint  Patient presents with   Follow-up    HFimpEF    History of Present Illness:    Jean Davidson is a 78 y.o. female with a hx of hypertension, paroxysmal SVT, remote large pericardial effusion resolved without intervention, mild carotic stenosis, hypertension, GERD, chronic anemia, relapsed/refractory multiple myeloma on Elrexfio .  She was initially referred to cardiology in 2016 for palpitations with a Holter monitor showing NSR with infrequent PVCs/PACs.  POET at that time was normal.  Repeat heart monitor in 2020 for increasing palpitations showed multiple runs of SVT.  She was transition from carvedilol  to metoprolol  and did well.  Echocardiogram in 2017 with large pericardial effusion managed by [facility] cardiology, effusion resolved without intervention.  She was recently admitted 02/2024 with severe sepsis and acute hypoxic respiratory failure secondary to pneumonia.  Echocardiogram demonstrated LVEF 35-40% with no RWMA, grade 1 DD, normal RV, moderate MR.  She was diuresed and discharged on Lasix  80 mg daily, 25 mg Toprol .  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  Due to her strong family history of CAD, decision was made to defer ischemic evaluation in the setting of severe illness and revisit possibly CT coronary once recovered.  I saw her in follow-up 04/04/2024 following recent discharge from SNF.  She has a chronic indwelling Foley catheter for prolapsed bladder.  She was ambulating is much as possible, remains on 80 mg Lasix .  When I saw her there was some confusion regarding medications that she was taking at the SNF, specifically potassium  supplementation.  I opted to restart 40 mg of Lasix  with 20 mEq of potassium with BP meds scheduled at the cancer center on 04/16/2024.  Appears that renal function remained stable but K was 3.0.  When I saw her back in clinic 04/22/2024 she was having issues with recurrent UTI with multiple Foley catheters.  She was also having cancer treatments every other Wednesday, oncology started aspirin  many years ago.  She reported hematuria with her recurrent UTIs.  She ambulates with a rolling walker.  She is making great improvement in her functional ability at home.  Blood pressure had improved and I was able to continue titrating GDMT.  I increased Toprol  to 37.5 mg at night, started 12.5 mg spironolactone , reduce Lasix  to 20 mg daily and continue potassium 20 mEq daily.  She returns today for follow-up.   Fortunately repeat echocardiogram 05/02/2024 showed an improved LVEF to 60 to 65%.  She is not using her rollator today. She is perpetually tired. She is pending bladder lift surgery. She still has foley catheter in place. She is slowly improving but still has a lingering cough. No chest pain, can't complete 4.0 METS.     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   Osteopenia    Spinal stenosis in cervical region    Varicose veins of lower extremities with inflammation     Past Surgical History:  Procedure Laterality Date   ANKLE SURGERY Left    BLADDER SUSPENSION  1988/1989   x2   COLONOSCOPY      ESOPHAGOGASTRODUODENOSCOPY     IR IMAGING GUIDED PORT INSERTION  03/01/2018   LIMBAL STEM CELL TRANSPLANT     x2   PORT-A-CATH REMOVAL     SHOULDER SURGERY Right 04/06/2013   right   UPPER GASTROINTESTINAL ENDOSCOPY      Current Medications: Current Meds  Medication Sig   acyclovir  (ZOVIRAX ) 400 MG tablet Take 1 tablet (400 mg total) by mouth 2 (two) times daily.   atovaquone  (MEPRON ) 750 MG/5ML suspension TAKE 10 MLS (1,500 MG TOTAL) BY MOUTH 2 (TWO) TIMES DAILY.   cholecalciferol  (VITAMIN D3) 25 MCG (1000 UNIT) tablet Take 4,000 Units by mouth daily. Instructed to increase to 4,000 units daily   furosemide  (LASIX ) 20 MG tablet Take 1 tablet (20 mg total) by mouth daily.   Magnesium  Citrate 200 MG TABS Take 200 mg by mouth daily.   metoprolol  succinate (TOPROL -XL) 25 MG 24 hr tablet Take 1.5 tablets (37.5 mg total) by mouth daily.   mirtazapine  (REMERON ) 15 MG tablet TAKE 1 TABLET BY MOUTH EVERYDAY AT BEDTIME   Multiple Vitamins-Minerals (PRESERVISION AREDS 2 PO) Take 1 tablet by mouth 2 (two) times daily.   ondansetron  (ZOFRAN ) 8 MG tablet Take 1 tablet (8 mg total) by mouth every 8 (eight) hours as needed (Nausea or vomiting).   pantoprazole  (PROTONIX ) 40 MG tablet TAKE 1 TABLET BY MOUTH 2 TIMES DAILY. TAKE 30-60 MINUTES BEFORE BREAKFAST AND DINNER.   potassium chloride  SA (KLOR-CON  M20) 20 MEQ tablet Take 1 tablet (20 mEq total) by mouth daily.   prochlorperazine  (COMPAZINE ) 5 MG tablet Take 5 mg by mouth every 6 (six) hours as needed for nausea or vomiting.   senna-docusate (SENOKOT-S) 8.6-50 MG tablet Take 2 tablets by mouth daily as needed.   spironolactone  (ALDACTONE ) 25 MG tablet Take 0.5 tablets (12.5 mg total) by mouth daily.   [DISCONTINUED] ivabradine  (CORLANOR) 5 MG TABS tablet Take 2 tablets 2 hours prior to cardiac ct     Allergies:   Patient has no known allergies.   Social History   Socioeconomic History   Marital status: Divorced    Spouse name: Not on file    Number of children: Not on file   Years of education: Not on file   Highest education level: Not on file  Occupational History   Not on file  Tobacco Use   Smoking status: Former    Current packs/day: 0.00    Average packs/day: 0.5 packs/day for 4.0 years (2.0 ttl pk-yrs)    Types: Cigarettes    Start date: 06/26/1964    Quit date: 06/26/1968    Years since quitting: 55.9   Smokeless tobacco: Never  Vaping Use   Vaping status: Never Used  Substance and Sexual Activity   Alcohol  use: Yes    Alcohol /week: 1.0 standard drink of alcohol     Types: 1 Cans of beer per week    Comment: 1 per week per pt   Drug  use: No   Sexual activity: Not on file  Other Topics Concern   Not on file  Social History Narrative   [name] is daughter breeanne, oblinger will,  full code ( reviewed 77)   Social Drivers of Health   Financial Resource Strain: Low Risk  (10/09/2023)   Received from [facility]   Overall Financial Resource Strain (CARDIA)    Difficulty of Paying Living Expenses: Not hard at all  Food Insecurity: No Food Insecurity (03/09/2024)   Hunger Vital Sign    Worried About Running Out of Food in the Last Year: Never true    Ran Out of Food in the Last Year: Never true  Transportation Needs: No Transportation Needs (03/09/2024)   PRAPARE - Administrator, Civil Service (Medical): No    Lack of Transportation (Non-Medical): No  Physical Activity: Not on file  Stress: Not on file  Social Connections: Moderately Integrated (03/09/2024)   Social Connection and Isolation Panel    Frequency of Communication with Friends and Family: More than three times a week    Frequency of Social Gatherings with Friends and Family: Twice a week    Attends Religious Services: 1 to 4 times per year    Active Member of [employer] or Organizations: Yes    Attends Engineer, Structural: 1 to 4 times per year    Marital Status: Divorced     Family History: The patient's  family history includes Colon cancer (age of onset: 60) in her paternal aunt; Diabetes in her father; Heart disease in her brother, father, and mother; Heart failure in her father; Hypertension in her brother; Kidney disease in her father; Skin cancer in her brother and father; Throat cancer in her father. There is no history of Stomach cancer, Esophageal cancer, or Rectal cancer.  ROS:   Please see the history of present illness.     All other systems reviewed and are negative.  EKGs/Labs/Other Studies Reviewed:    The following studies were reviewed today:       Recent Labs: 03/10/2024: Pro Brain Natriuretic Peptide 16,972.0 05/14/2024: ALT 7; BUN 15; Creatinine 0.97; Hemoglobin 11.0; Magnesium  2.0; Platelet Count 146; Potassium 3.9; Sodium 137  Recent Lipid Panel    Component Value Date/Time   CHOL 110 08/08/2013 1127   TRIG 82.0 08/08/2013 1127   HDL 36.50 (L) 08/08/2013 1127   CHOLHDL 3 08/08/2013 1127   VLDL 16.4 08/08/2013 1127   LDLCALC 57 08/08/2013 1127     Risk Assessment/Calculations:                Physical Exam:    VS:  BP 124/60 (BP Location: Left Arm, Patient Position: Sitting, Cuff Size: Normal)   Pulse 100   Ht 5' 2 (1.575 m)   Wt 151 lb (68.5 kg)   SpO2 98%   BMI 27.62 kg/m     Wt Readings from Last 3 Encounters:  05/20/24 151 lb (68.5 kg)  05/14/24 144 lb 12.8 oz (65.7 kg)  04/30/24 152 lb (68.9 kg)     GEN:  Well nourished, well developed in no acute distress HEENT: Normal NECK: No JVD; No carotid bruits LYMPHATICS: No lymphadenopathy CARDIAC: RRR, no murmurs, rubs, gallops RESPIRATORY:  Clear to auscultation without rales, wheezing or rhonchi  ABDOMEN: Soft, non-tender, non-distended MUSCULOSKELETAL:  No edema; No deformity  SKIN: Warm and dry NEUROLOGIC:  Alert and oriented x 3 PSYCHIATRIC:  Normal affect   ASSESSMENT:    1.  Chronic systolic heart failure (HCC)   2. Preoperative clearance   3. Preoperative cardiovascular  examination   4. Primary hypertension   5. Pelvic floor dysfunction in female   6. Paroxysmal SVT (supraventricular tachycardia)    PLAN:    In order of problems listed above:  Chronic systolic heart failure - improved LVEF on repeat echo Moderate MR -LVEF 35-40% with moderate MR-03/11/2024 --> improved with GDMT and echo 05/02/2024 with LVEF 60 to 65% - GDMT: 37.5 mg Toprol , 12.5 mg spironolactone , 20 mg Lasix  -Not a candidate for SGLT2 inhibitor given recurrent UTI -Initially plan for CT coronary with 10 mg Corlanor - not yet scheduled, initially held off as she continued to improve from recent illness -- no chest pain, feels well from a cardiac perspective -- she missed an IVIG, awaiting next infusion -- given need for upcoming bladder surgery, will now complete CT coronary given recent systolic heart failure and preoperative risk assessment - will need 10 mg corlanor in addition to her 37.5 mg toprol  -- no changes to medications including diuretic given MR   Hypokalemia -- K 3.0 with 40 mg of Lasix  and 20 mEq potassium - now on 20 mg lasix  and 20 mgEq potassium -- never started Mg - Mg now 2.0 (1.7) - ok to hold off, Mg now improved as is potassium   Hypertension - Managed in the context of CHF   Paroxysmal SVT - Continues to do well on Toprol  - moved to evening dosing for mild hypotension during the day and fatigue -- taking 37.5 mg nightly   Multiple myeloma - taking shots every other week, very fatigued,intermittent IVIG   Preoperative risk evaluation She is not using her rollin walker as much and not using a cane. She is slowly improving. She is working up to 4.0 METS. Her activity is limited by extreme fatigue related to multiple myeloma. She is due for bladder surgery in January and needs preoperative risk evaluation. LVEF improved on most recent echo. Will complete CT coronary with corlanor.           Medication Adjustments/Labs and Tests Ordered: Current  medicines are reviewed at length with the patient today.  Concerns regarding medicines are outlined above.  Orders Placed This Encounter  Procedures   CT CORONARY MORPH W/CTA COR W/SCORE W/CA W/CM &/OR WO/CM   Meds ordered this encounter  Medications   DISCONTD: ivabradine  (CORLANOR) 5 MG TABS tablet    Sig: Take 2 tablets 2 hours prior to cardiac ct    Dispense:  2 tablet    Refill:  0   ivabradine  (CORLANOR) 5 MG TABS tablet    Sig: Take 2 tablets 2 hours prior to cardiac ct    Dispense:  2 tablet    Refill:  0    Patient Instructions  Medication Instructions:  Your physician recommends that you continue on your current medications as directed. Please refer to the Current Medication list given to you today.  Corlanor 5 mg- take 2 tablets 2 hours prior to cardiac ct  *If you need a refill on your cardiac medications before your next appointment, please call your pharmacy*  Lab Work: NONE ordered at this time of appointment   Testing/Procedures:   Your cardiac CT will be scheduled at one of the below locations:   [facility] [address]  [place], [place] [address]   If scheduled at the [facility] at [address], please enter the parking  lot using the [address] entrance and use the FREE valet service at the patient drop-off area. Enter the building and check-in with registration on the main floor.   Please follow these instructions carefully (unless otherwise directed):  An IV will be required for this test and Nitroglycerin will be given.   On the Night Before the Test: Be sure to Drink plenty of water. Do not consume any caffeinated/decaffeinated beverages or chocolate 12 hours prior to your test. Do not take any antihistamines 12 hours prior to your test.  On the Day of the Test: Drink plenty of water until 1 hour prior to the test. Do not eat any food 1 hour prior to test. You may take your regular  medications prior to the test.  Take metoprolol  (Lopressor )take your evening dose as usual and take Corlanor 2 hours prior to cardiac ct. If you take Furosemide /Hydrochlorothiazide/Spironolactone /Chlorthalidone, please HOLD on the morning of the test. Patients who wear a continuous glucose monitor MUST remove the device prior to scanning. FEMALES- please wear underwire-free bra if available, avoid dresses & tight clothing       After the Test: Drink plenty of water. After receiving IV contrast, you may experience a mild flushed feeling. This is normal. On occasion, you may experience a mild rash up to 24 hours after the test. This is not dangerous. If this occurs, you can take Benadryl  25 mg, Zyrtec, Claritin, or Allegra and increase your fluid intake. (Patients taking Tikosyn should avoid Benadryl , and may take Zyrtec, Claritin, or Allegra) If you experience trouble breathing, this can be serious. If it is severe call 911 IMMEDIATELY. If it is mild, please call our office.  We will call to schedule your test 2-4 weeks out understanding that some insurance companies will need an authorization prior to the service being performed.   For more information and frequently asked questions, please visit our website : [email or web address]  For non-scheduling related questions, please contact the cardiac imaging nurse navigator should you have any questions/concerns: Cardiac Imaging Nurse Navigators Direct Office Dial: [phone]   For scheduling needs, including cancellations and rescheduling, please call Brittany, [phone].   Follow-Up: At [facility], you and your health needs are our priority.  As part of our continuing mission to provide you with exceptional heart care, our providers are all part of one team.  This team includes your primary Cardiologist (physician) and Advanced Practice Providers or APPs (Physician Assistants and Nurse Practitioners) who all work  together to provide you with the care you need, when you need it.  Your next appointment:   3 month(s)  Provider:   Deatrice Cage, MD or Jon Hails, PA-C          We recommend signing up for the patient portal called MyChart.  Sign up information is provided on this After Visit Summary.  MyChart is used to connect with patients for Virtual Visits (Telemedicine).  Patients are able to view lab/test results, encounter notes, upcoming appointments, etc.  Non-urgent messages can be sent to your provider as well.   To learn more about what you can do with MyChart, go to [email or web address].   Other Instructions            Signed, Jon Nat Hails, [place]  05/20/2024 10:42 AM    [facility]

## 2024-05-18 LAB — MULTIPLE MYELOMA PANEL, SERUM
Albumin SerPl Elph-Mcnc: 3.6 g/dL (ref 2.9–4.4)
Albumin/Glob SerPl: 1.4 (ref 0.7–1.7)
Alpha 1: 0.4 g/dL (ref 0.0–0.4)
Alpha2 Glob SerPl Elph-Mcnc: 1.1 g/dL — ABNORMAL HIGH (ref 0.4–1.0)
B-Globulin SerPl Elph-Mcnc: 1 g/dL (ref 0.7–1.3)
Gamma Glob SerPl Elph-Mcnc: 0.2 g/dL — ABNORMAL LOW (ref 0.4–1.8)
Globulin, Total: 2.7 g/dL (ref 2.2–3.9)
IgA: <5 mg/dL — ABNORMAL LOW (ref 64–422)
IgG (Immunoglobin G), Serum: 299 mg/dL — ABNORMAL LOW (ref 586–1602)
IgM (Immunoglobulin M), Srm: 5 mg/dL — ABNORMAL LOW (ref 26–217)
Total Protein ELP: 6.3 g/dL (ref 6.0–8.5)

## 2024-05-20 ENCOUNTER — Other Ambulatory Visit (HOSPITAL_COMMUNITY): Payer: Self-pay

## 2024-05-20 ENCOUNTER — Encounter: Payer: Self-pay | Admitting: Physician Assistant

## 2024-05-20 ENCOUNTER — Ambulatory Visit: Attending: Physician Assistant | Admitting: Physician Assistant

## 2024-05-20 ENCOUNTER — Encounter: Payer: Self-pay | Admitting: Hematology and Oncology

## 2024-05-20 VITALS — BP 124/60 | HR 100 | Ht 62.0 in | Wt 151.0 lb

## 2024-05-20 DIAGNOSIS — I5022 Chronic systolic (congestive) heart failure: Secondary | ICD-10-CM | POA: Diagnosis not present

## 2024-05-20 DIAGNOSIS — I10 Essential (primary) hypertension: Secondary | ICD-10-CM | POA: Diagnosis not present

## 2024-05-20 DIAGNOSIS — Z01810 Encounter for preprocedural cardiovascular examination: Secondary | ICD-10-CM

## 2024-05-20 DIAGNOSIS — M6289 Other specified disorders of muscle: Secondary | ICD-10-CM

## 2024-05-20 DIAGNOSIS — Z01818 Encounter for other preprocedural examination: Secondary | ICD-10-CM | POA: Diagnosis not present

## 2024-05-20 DIAGNOSIS — I471 Supraventricular tachycardia, unspecified: Secondary | ICD-10-CM

## 2024-05-20 MED ORDER — IVABRADINE HCL 5 MG PO TABS: ORAL_TABLET | ORAL | 0 refills | Status: DC

## 2024-05-20 MED ORDER — IVABRADINE HCL 5 MG PO TABS
ORAL_TABLET | ORAL | 0 refills | Status: DC
  Filled 2024-05-20: qty 2, 1d supply, fill #0

## 2024-05-20 NOTE — Patient Instructions (Signed)
 Medication Instructions:  Your physician recommends that you continue on your current medications as directed. Please refer to the Current Medication list given to you today.  Corlanor 5 mg- take 2 tablets 2 hours prior to cardiac ct  *If you need a refill on your cardiac medications before your next appointment, please call your pharmacy*  Lab Work: NONE ordered at this time of appointment   Testing/Procedures:   Your cardiac CT will be scheduled at one of the below locations:   [facility] [address]  [place], [place] [address]   If scheduled at the [facility] at [address], please enter the parking lot using the [address] entrance and use the FREE valet service at the patient drop-off area. Enter the building and check-in with registration on the main floor.   Please follow these instructions carefully (unless otherwise directed):  An IV will be required for this test and Nitroglycerin will be given.   On the Night Before the Test: Be sure to Drink plenty of water. Do not consume any caffeinated/decaffeinated beverages or chocolate 12 hours prior to your test. Do not take any antihistamines 12 hours prior to your test.  On the Day of the Test: Drink plenty of water until 1 hour prior to the test. Do not eat any food 1 hour prior to test. You may take your regular medications prior to the test.  Take metoprolol  (Lopressor )take your evening dose as usual and take Corlanor 2 hours prior to cardiac ct. If you take Furosemide /Hydrochlorothiazide/Spironolactone /Chlorthalidone, please HOLD on the morning of the test. Patients who wear a continuous glucose monitor MUST remove the device prior to scanning. FEMALES- please wear underwire-free bra if available, avoid dresses & tight clothing       After the Test: Drink plenty of water. After receiving IV contrast, you may experience a mild flushed feeling. This is  normal. On occasion, you may experience a mild rash up to 24 hours after the test. This is not dangerous. If this occurs, you can take Benadryl  25 mg, Zyrtec, Claritin, or Allegra and increase your fluid intake. (Patients taking Tikosyn should avoid Benadryl , and may take Zyrtec, Claritin, or Allegra) If you experience trouble breathing, this can be serious. If it is severe call 911 IMMEDIATELY. If it is mild, please call our office.  We will call to schedule your test 2-4 weeks out understanding that some insurance companies will need an authorization prior to the service being performed.   For more information and frequently asked questions, please visit our website : [email or web address]  For non-scheduling related questions, please contact the cardiac imaging nurse navigator should you have any questions/concerns: Cardiac Imaging Nurse Navigators Direct Office Dial: [phone]   For scheduling needs, including cancellations and rescheduling, please call Brittany, [phone].   Follow-Up: At [facility], you and your health needs are our priority.  As part of our continuing mission to provide you with exceptional heart care, our providers are all part of one team.  This team includes your primary Cardiologist (physician) and Advanced Practice Providers or APPs (Physician Assistants and Nurse Practitioners) who all work together to provide you with the care you need, when you need it.  Your next appointment:   3 month(s)  Provider:   Deatrice Cage, MD or Jon Hails, PA-C          We recommend signing up for the patient portal called MyChart.  Sign up information is provided  on this After Visit Summary.  MyChart is used to connect with patients for Virtual Visits (Telemedicine).  Patients are able to view lab/test results, encounter notes, upcoming appointments, etc.  Non-urgent messages can be sent to your provider as well.   To learn more about what you can  do with MyChart, go to [email or web address].   Other Instructions

## 2024-05-21 ENCOUNTER — Other Ambulatory Visit: Payer: Self-pay

## 2024-05-24 ENCOUNTER — Encounter: Payer: Self-pay | Admitting: Hematology and Oncology

## 2024-05-28 ENCOUNTER — Inpatient Hospital Stay: Attending: Hematology and Oncology

## 2024-05-28 ENCOUNTER — Ambulatory Visit (HOSPITAL_COMMUNITY)
Admission: RE | Admit: 2024-05-28 | Discharge: 2024-05-28 | Disposition: A | Discharge status: Home / Self Care | Source: Ambulatory Visit | Attending: Physician Assistant | Admitting: Physician Assistant

## 2024-05-28 ENCOUNTER — Inpatient Hospital Stay: Admitting: Physician Assistant

## 2024-05-28 VITALS — BP 105/71 | HR 97 | Temp 98.0°F | Resp 16 | Ht 62.0 in | Wt 148.8 lb

## 2024-05-28 DIAGNOSIS — Z808 Family history of malignant neoplasm of other organs or systems: Secondary | ICD-10-CM | POA: Insufficient documentation

## 2024-05-28 DIAGNOSIS — Z9481 Bone marrow transplant status: Secondary | ICD-10-CM | POA: Diagnosis not present

## 2024-05-28 DIAGNOSIS — R058 Other specified cough: Secondary | ICD-10-CM

## 2024-05-28 DIAGNOSIS — R059 Cough, unspecified: Secondary | ICD-10-CM | POA: Diagnosis not present

## 2024-05-28 DIAGNOSIS — C9001 Multiple myeloma in remission: Secondary | ICD-10-CM

## 2024-05-28 DIAGNOSIS — Z9484 Stem cells transplant status: Secondary | ICD-10-CM | POA: Diagnosis not present

## 2024-05-28 DIAGNOSIS — Z800 Family history of malignant neoplasm of digestive organs: Secondary | ICD-10-CM | POA: Insufficient documentation

## 2024-05-28 DIAGNOSIS — C9002 Multiple myeloma in relapse: Secondary | ICD-10-CM | POA: Diagnosis present

## 2024-05-28 DIAGNOSIS — Z87891 Personal history of nicotine dependence: Secondary | ICD-10-CM | POA: Insufficient documentation

## 2024-05-28 LAB — CMP (CANCER CENTER ONLY)
ALT: 6 U/L (ref 0–44)
AST: 24 U/L (ref 15–41)
Albumin: 4 g/dL (ref 3.5–5.0)
Alkaline Phosphatase: 85 U/L (ref 38–126)
Anion gap: 14 (ref 5–15)
BUN: 10 mg/dL (ref 8–23)
CO2: 25 mmol/L (ref 22–32)
Calcium: 9.5 mg/dL (ref 8.9–10.3)
Chloride: 100 mmol/L (ref 98–111)
Creatinine: 0.91 mg/dL (ref 0.44–1.00)
GFR, Estimated: >60 mL/min (ref >60)
Glucose, Bld: 123 mg/dL — ABNORMAL HIGH (ref 70–99)
Potassium: 3.8 mmol/L (ref 3.5–5.1)
Sodium: 140 mmol/L (ref 135–145)
Total Bilirubin: 0.3 mg/dL (ref 0.0–1.2)
Total Protein: 7 g/dL (ref 6.5–8.1)

## 2024-05-28 LAB — CBC WITH DIFFERENTIAL (CANCER CENTER ONLY)
Abs Immature Granulocytes: 0.15 K/uL — ABNORMAL HIGH (ref 0.00–0.07)
Basophils Absolute: 0 K/uL (ref 0.0–0.1)
Basophils Relative: 0 %
Eosinophils Absolute: 0 K/uL (ref 0.0–0.5)
Eosinophils Relative: 0 %
HCT: 32.4 % — ABNORMAL LOW (ref 36.0–46.0)
Hemoglobin: 10.4 g/dL — ABNORMAL LOW (ref 12.0–15.0)
Immature Granulocytes: 3 %
Lymphocytes Relative: 33 %
Lymphs Abs: 1.8 K/uL (ref 0.7–4.0)
MCH: 32.1 pg (ref 26.0–34.0)
MCHC: 32.1 g/dL (ref 30.0–36.0)
MCV: 100 fL (ref 80.0–100.0)
Monocytes Absolute: 0.6 K/uL (ref 0.1–1.0)
Monocytes Relative: 11 %
Neutro Abs: 2.9 K/uL (ref 1.7–7.7)
Neutrophils Relative %: 53 %
Platelet Count: 166 K/uL (ref 150–400)
RBC: 3.24 MIL/uL — ABNORMAL LOW (ref 3.87–5.11)
RDW: 15.6 % — ABNORMAL HIGH (ref 11.5–15.5)
WBC Count: 5.5 K/uL (ref 4.0–10.5)
nRBC: 0 % (ref 0.0–0.2)

## 2024-05-28 MED ORDER — ELRANATAMAB-BCMM 76 MG/1.9ML ~~LOC~~ SOLN
76.0000 mg | Freq: Once | SUBCUTANEOUS | Status: AC
  Administered 2024-05-28: 76 mg via SUBCUTANEOUS
  Filled 2024-05-28: qty 1.9

## 2024-05-28 MED ORDER — BENZONATATE 100 MG PO CAPS: 100.0000 mg | ORAL_CAPSULE | Freq: Three times a day (TID) | ORAL | 0 refills | Status: AC | PRN

## 2024-05-28 NOTE — Patient Instructions (Signed)
 [facility] - A DEPT OF [facility]  Discharge Instructions: Thank you for choosing [facility] to provide your oncology and hematology care.   If you have a lab appointment with the Cancer Center, please go directly to the Cancer Center and check in at the registration area.   Wear comfortable clothing and clothing appropriate for easy access to any Portacath or PICC line.   We strive to give you quality time with your provider. You may need to reschedule your appointment if you arrive late (15 or more minutes).  Arriving late affects you and other patients whose appointments are after yours.  Also, if you miss three or more appointments without notifying the office, you may be dismissed from the clinic at the provider's discretion.      For prescription refill requests, have your pharmacy contact our office and allow 72 hours for refills to be completed.    Today you received the following chemotherapy and/or immunotherapy agents elrexrio      To help prevent nausea and vomiting after your treatment, we encourage you to take your nausea medication as directed.  BELOW ARE SYMPTOMS THAT SHOULD BE REPORTED IMMEDIATELY: *FEVER GREATER THAN 100.4 F (38 C) OR HIGHER *CHILLS OR SWEATING *NAUSEA AND VOMITING THAT IS NOT CONTROLLED WITH YOUR NAUSEA MEDICATION *UNUSUAL SHORTNESS OF BREATH *UNUSUAL BRUISING OR BLEEDING *URINARY PROBLEMS (pain or burning when urinating, or frequent urination) *BOWEL PROBLEMS (unusual diarrhea, constipation, pain near the anus) TENDERNESS IN MOUTH AND THROAT WITH OR WITHOUT PRESENCE OF ULCERS (sore throat, sores in mouth, or a toothache) UNUSUAL RASH, SWELLING OR PAIN  UNUSUAL VAGINAL DISCHARGE OR ITCHING   Items with * indicate a potential emergency and should be followed up as soon as possible or go to the Emergency Department if any problems should occur.  Please show the CHEMOTHERAPY ALERT CARD or IMMUNOTHERAPY  ALERT CARD at check-in to the Emergency Department and triage nurse.  Should you have questions after your visit or need to cancel or reschedule your appointment, please contact [facility] - A DEPT OF [facility]  Dept: [phone]  and follow the prompts.  Office hours are 8:00 a.m. to 4:30 p.m. Monday - Friday. Please note that voicemails left after 4:00 p.m. may not be returned until the following business day.  We are closed weekends and major holidays. You have access to a nurse at all times for urgent questions. Please call the main number to the clinic Dept: [phone] and follow the prompts.   For any non-urgent questions, you may also contact your provider using MyChart. We now offer e-Visits for anyone 79 and older to request care online for non-urgent symptoms. For details visit [email or web address].   Also download the MyChart app! Go to the app store, search MyChart, open the app, select [place], and log in with your MyChart username and password.

## 2024-05-28 NOTE — Progress Notes (Unsigned)
 [facility] [phone]   [phone]  PROGRESS NOTE  Patient Care Team: Patient, [name] as PCP - General (General Practice) Darron Deatrice LABOR, MD as PCP - Cardiology (Cardiology) Darren Liborio Backers, MD as Referring Physician (Hematology and Oncology) Guinevere File, MD as Consulting Physician (Internal Medicine) Fleeta Rothman, Jomarie SAILOR, MD as Consulting Physician (Infectious Diseases) Marda Maranda Foots An, MD as Consulting Physician (Hematology and Oncology) Honora Lenon Irving, NP as Nurse Practitioner (Hematology and Oncology) Federico Norleen ONEIDA MADISON, MD as Consulting Physician (Hematology and Oncology) Duke, Jon Garre, [place] as Physician Assistant (Cardiology)  Hematological/Oncological History # Relapsed/Refractory IgA Lambda Multiple Myeloma, In Remission 2016: Underwent autologous stem cell transplant 2017: Underwent allogenic stem cell transplant 10/16/2022: Completed CAR-T therapy with [facility] 09/2023: Started on Elrexfio  due to progression of disease 01/02/2024: Last visit at [facility] where induction therapy was completed 01/09/2024: establish care with Dr. Federico.   Interval History:  Jean Davidson 78 y.o. female with medical history significant for relapsed/refractory multiple myeloma who presents for a follow up visit. The patient's last visit was on 04/30/2024. In the interim since the last visit she has continued maintenance Elrexfio  therapy  Ms. Eckmann reports she has an ongoing cough that has not improved with over-the-counter measures including Flonase nasal spray and Mucinex .  The cough is productive with sputum that is off-white in color.  She reports the cough is triggered when she is lying down.  She had a fever once yesterday which resolved on its own.  She reports having some nausea when she has a coughing spell but no vomiting.  She had 1 episode of diarrhea yesterday which has resolved on its own.  She denies  easy bruising or signs of active bleeding.  Patient denies chills, sweats, shortness of breath, chest pain, headaches or dizziness. Full 10 point ROS otherwise negative.  Overall she is willing and able to continue with Elrexfio  today.   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   Osteopenia    Spinal stenosis in cervical region    Varicose veins of lower extremities with inflammation     SURGICAL HISTORY: Past Surgical History:  Procedure Laterality Date   ANKLE SURGERY Left    BLADDER SUSPENSION  1988/1989   x2   COLONOSCOPY     ESOPHAGOGASTRODUODENOSCOPY     IR IMAGING GUIDED PORT INSERTION  03/01/2018   LIMBAL STEM CELL TRANSPLANT     x2   PORT-A-CATH REMOVAL     SHOULDER SURGERY Right 04/06/2013   right   UPPER GASTROINTESTINAL ENDOSCOPY      SOCIAL HISTORY: Social History   Socioeconomic History   Marital status: Divorced    Spouse name: Not on file   Number of children: Not on file   Years of education: Not on file   Highest education level: Not  on file  Occupational History   Not on file  Tobacco Use   Smoking status: Former    Current packs/day: 0.00    Average packs/day: 0.5 packs/day for 4.0 years (2.0 ttl pk-yrs)    Types: Cigarettes    Start date: 06/26/1964    Quit date: 06/26/1968    Years since quitting: 55.9   Smokeless tobacco: Never  Vaping Use   Vaping status: Never Used  Substance and Sexual Activity   Alcohol  use: Yes    Alcohol /week:  1.0 standard drink of alcohol     Types: 1 Cans of beer per week    Comment: 1 per week per pt   Drug use: No   Sexual activity: Not on file  Other Topics Concern   Not on file  Social History Narrative   Jean Davidson is daughter roberta, angell will,  full code ( reviewed 70)   Social Drivers of Health   Financial Resource Strain: Low Risk  (10/09/2023)   Received from [facility]   Overall Financial Resource Strain (CARDIA)    Difficulty of Paying Living Expenses: Not hard at all  Food Insecurity: No Food Insecurity (03/09/2024)   Hunger Vital Sign    Worried About Running Out of Food in the Last Year: Never true    Ran Out of Food in the Last Year: Never true  Transportation Needs: No Transportation Needs (03/09/2024)   PRAPARE - Administrator, Civil Service (Medical): No    Lack of Transportation (Non-Medical): No  Physical Activity: Not on file  Stress: Not on file  Social Connections: Moderately Integrated (03/09/2024)   Social Connection and Isolation Panel    Frequency of Communication with Friends and Family: More than three times a week    Frequency of Social Gatherings with Friends and Family: Twice a week    Attends Religious Services: 1 to 4 times per year    Active Member of [employer] or Organizations: Yes    Attends Banker Meetings: 1 to 4 times per year    Marital Status: Divorced  Catering Manager Violence: Not At Risk (03/09/2024)   Humiliation, Afraid, Rape, and Kick questionnaire    Fear of Current or Ex-Partner: No    Emotionally Abused: No    Physically Abused: No    Sexually Abused: No    FAMILY HISTORY: Family History  Problem Relation Age of Onset   Heart disease Mother    Heart failure Father    Heart disease Father    Throat cancer Father    Skin cancer Father    Diabetes Father    Kidney disease Father    Hypertension Brother    Heart disease Brother        congenital shunt, ?    Colon cancer  Paternal Aunt 27   Skin cancer Brother    Stomach cancer Neg Hx    Esophageal cancer Neg Hx    Rectal cancer Neg Hx     ALLERGIES:  has no known allergies.  MEDICATIONS:  Current Outpatient Medications  Medication Sig Dispense Refill   acyclovir  (ZOVIRAX ) 400 MG tablet Take 1 tablet (400 mg total) by mouth 2 (two) times daily. 180 tablet 1   atovaquone  (MEPRON ) 750 MG/5ML suspension TAKE 10 MLS (1,500 MG TOTAL) BY MOUTH 2 (TWO) TIMES DAILY. 420 mL 3   cholecalciferol  (VITAMIN D3) 25 MCG (1000 UNIT) tablet Take 4,000 Units by mouth daily. Instructed to increase to 4,000 units daily  furosemide  (LASIX ) 20 MG tablet Take 1 tablet (20 mg total) by mouth daily. 90 tablet 3   ivabradine  (CORLANOR) 5 MG TABS tablet Take 2 tablets 2 hours prior to cardiac ct 2 tablet 0   Magnesium  Citrate 200 MG TABS Take 200 mg by mouth daily. 90 tablet 3   metoprolol  succinate (TOPROL -XL) 25 MG 24 hr tablet Take 1.5 tablets (37.5 mg total) by mouth daily. 135 tablet 3   mirtazapine  (REMERON ) 15 MG tablet TAKE 1 TABLET BY MOUTH EVERYDAY AT BEDTIME 90 tablet 1   Multiple Vitamins-Minerals (PRESERVISION AREDS 2 PO) Take 1 tablet by mouth 2 (two) times daily.     ondansetron  (ZOFRAN ) 8 MG tablet Take 1 tablet (8 mg total) by mouth every 8 (eight) hours as needed (Nausea or vomiting). 60 tablet 1   pantoprazole  (PROTONIX ) 40 MG tablet TAKE 1 TABLET BY MOUTH 2 TIMES DAILY. TAKE 30-60 MINUTES BEFORE BREAKFAST AND DINNER. 180 tablet 0   potassium chloride  SA (KLOR-CON  M20) 20 MEQ tablet Take 1 tablet (20 mEq total) by mouth daily. 90 tablet 1   prochlorperazine  (COMPAZINE ) 5 MG tablet Take 5 mg by mouth every 6 (six) hours as needed for nausea or vomiting.     senna-docusate (SENOKOT-S) 8.6-50 MG tablet Take 2 tablets by mouth daily as needed.     spironolactone  (ALDACTONE ) 25 MG tablet Take 0.5 tablets (12.5 mg total) by mouth daily. 90 tablet 3   No current facility-administered medications for this visit.     REVIEW OF SYSTEMS:   Constitutional: ( - ) fevers, ( - )  chills , ( - ) night sweats Eyes: ( - ) blurriness of vision, ( - ) double vision, ( - ) watery eyes Ears, nose, mouth, throat, and face: ( - ) mucositis, ( - ) sore throat Respiratory: (+ ) cough, ( - ) dyspnea, ( - ) wheezes Cardiovascular: ( - ) palpitation, ( - ) chest discomfort, ( - ) lower extremity swelling Gastrointestinal:  ( +) nausea, ( - ) heartburn, ( - ) change in bowel habits Skin: ( - ) abnormal skin rashes Lymphatics: ( - ) new lymphadenopathy, ( - ) easy bruising Neurological: ( - ) numbness, ( - ) tingling, ( - ) new weaknesses Behavioral/Psych: ( - ) mood change, ( - ) new changes  All other systems were reviewed with the patient and are negative.  PHYSICAL EXAMINATION: ECOG PERFORMANCE STATUS: 1 - Symptomatic but completely ambulatory  Vitals:   05/28/24 1003  BP: 105/71  Pulse: 97  Resp: 16  Temp: 98 F (36.7 C)  SpO2: 98%    Filed Weights   05/28/24 1003  Weight: 148 lb 12.8 oz (67.5 kg)    PHYSICAL EXAM:  ECOG PERFORMANCE STATUS: 1 - Symptomatic but completely ambulatory   Vitals:   05/28/24 1003  BP: 105/71  Pulse: 97  Resp: 16  Temp: 98 F (36.7 C)  SpO2: 98%     GENERAL: Well-appearing elderly Caucasian female, alert, no distress and comfortable SKIN: skin color, texture, turgor are normal, no rashes or significant lesions EYES: conjunctiva are pink and non-injected, sclera clear LUNGS:normal breathing effort. Some wheezing noted in lower lobes bilaterally.  HEART: regular rate & rhythm and no murmurs and no lower extremity edema Musculoskeletal: no cyanosis of digits and no clubbing  PSYCH: alert & oriented x 3, fluent speech NEURO: no focal motor/sensory deficits   LABORATORY DATA:  I have reviewed the data as listed    Latest  Ref Rng & Units 05/28/2024    9:13 AM 05/14/2024    8:45 AM 05/05/2024    1:03 PM  CBC  WBC 4.0 - 10.5 K/uL 5.5  5.2  3.9   Hemoglobin  12.0 - 15.0 g/dL 89.5  88.9  8.7   Hematocrit 36.0 - 46.0 % 32.4  34.0  27.4   Platelets 150 - 400 K/uL 166  146  109        Latest Ref Rng & Units 05/28/2024    9:13 AM 05/14/2024    8:45 AM 04/30/2024   12:37 PM  CMP  Glucose 70 - 99 mg/dL 876  882  890   BUN 8 - 23 mg/dL 10  15  11    Creatinine 0.44 - 1.00 mg/dL 9.08  9.02  8.91   Sodium 135 - 145 mmol/L 140  137  138   Potassium 3.5 - 5.1 mmol/L 3.8  3.9  3.5   Chloride 98 - 111 mmol/L 100  98  101   CO2 22 - 32 mmol/L 25  24  28    Calcium  8.9 - 10.3 mg/dL 9.5  9.5  9.1   Total Protein 6.5 - 8.1 g/dL 7.0  6.9  6.4   Total Bilirubin 0.0 - 1.2 mg/dL 0.3  0.4  0.3   Alkaline Phos 38 - 126 U/L 85  89  77   AST 15 - 41 U/L 24  26  18    ALT 0 - 44 U/L 6  7  9      Lab Results  Component Value Date   MPROTEIN Not Observed 05/14/2024   MPROTEIN Not Observed 03/26/2024   MPROTEIN Not Observed 03/15/2024   Lab Results  Component Value Date   KPAFRELGTCHN <0.7 (L) 05/14/2024   KPAFRELGTCHN <0.7 (L) 03/26/2024   KPAFRELGTCHN <0.7 (L) 03/15/2024   LAMBDASER <1.5 (L) 05/14/2024   LAMBDASER <1.5 (L) 03/26/2024   LAMBDASER <1.5 (L) 03/15/2024   KAPLAMBRATIO UNABLE TO CALCULATE 05/14/2024   KAPLAMBRATIO UNABLE TO CALCULATE 03/26/2024   KAPLAMBRATIO UNABLE TO CALCULATE 03/15/2024     RADIOGRAPHIC STUDIES: ECHOCARDIOGRAM COMPLETE Result Date: 05/02/2024    ECHOCARDIOGRAM REPORT   Patient Name:   Jean Davidson Date of Exam: 05/02/2024 Medical Rec #:  [id number]      Height:       62.0 in Accession #:    [id number]     Weight:       152.0 lb Date of Birth:  August 06, 1945      BSA:          1.701 m Patient Age:    78 years       BP:           110/72 mmHg Patient Gender: F              HR:           95 bpm. Exam Location:  Outpatient Procedure: 2D Echo, Cardiac Doppler and Color Doppler (Both Spectral and Color            Flow Doppler were utilized during procedure). Indications:    Chronic combined systolic (congestive) and diastolic                  (congestive) heart failure (HCC)  History:        Patient has prior history of Echocardiogram examinations, most                 recent 03/11/2024.  Sonographer:  Tinnie Gosling RDCS Referring Phys: [id number] ANGELA NICOLE DUKE IMPRESSIONS  1. Left ventricular ejection fraction, by estimation, is 60 to 65%. The left ventricle has normal function. The left ventricle has no regional wall motion abnormalities. Left ventricular diastolic parameters are indeterminate.  2. Right ventricular systolic function is normal. The right ventricular size is normal.  3. The mitral valve is normal in structure. No evidence of mitral valve regurgitation. No evidence of mitral stenosis. Moderate mitral annular calcification.  4. The aortic valve is tricuspid. Aortic valve regurgitation is not visualized. No aortic stenosis is present.  5. The inferior vena cava is normal in size with greater than 50% respiratory variability, suggesting right atrial pressure of 3 mmHg. FINDINGS  Left Ventricle: Left ventricular ejection fraction, by estimation, is 60 to 65%. The left ventricle has normal function. The left ventricle has no regional wall motion abnormalities. The left ventricular internal cavity size was normal in size. There is  no left ventricular hypertrophy. Left ventricular diastolic parameters are indeterminate. Right Ventricle: The right ventricular size is normal. Right ventricular systolic function is normal. Left Atrium: Left atrial size was normal in size. Right Atrium: Right atrial size was normal in size. Pericardium: There is no evidence of pericardial effusion. Mitral Valve: The mitral valve is normal in structure. Moderate mitral annular calcification. No evidence of mitral valve regurgitation. No evidence of mitral valve stenosis. Tricuspid Valve: The tricuspid valve is normal in structure. Tricuspid valve regurgitation is trivial. No evidence of tricuspid stenosis. Aortic Valve: The aortic valve is tricuspid.  Aortic valve regurgitation is not visualized. No aortic stenosis is present. Pulmonic Valve: The pulmonic valve was normal in structure. Pulmonic valve regurgitation is not visualized. No evidence of pulmonic stenosis. Aorta: The aortic root is normal in size and structure. Venous: The inferior vena cava is normal in size with greater than 50% respiratory variability, suggesting right atrial pressure of 3 mmHg. IAS/Shunts: No atrial level shunt detected by color flow Doppler.  LEFT VENTRICLE PLAX 2D LVIDd:         4.30 cm LVIDs:         2.10 cm LV PW:         1.00 cm LV IVS:        1.00 cm LVOT diam:     1.90 cm LV SV:         45 LV SV Index:   26 LVOT Area:     2.84 cm  RIGHT VENTRICLE             IVC RV S prime:     14.50 cm/s  IVC diam: 1.40 cm TAPSE (M-mode): 1.6 cm LEFT ATRIUM         Index LA diam:    2.70 cm 1.59 cm/m  AORTIC VALVE LVOT Vmax:   75.00 cm/s LVOT Vmean:  53.500 cm/s LVOT VTI:    0.158 m  AORTA Ao Root diam: 2.80 cm Ao Asc diam:  3.00 cm MITRAL VALVE MV Area (PHT): 4.06 cm    SHUNTS MV Decel Time: 187 msec    Systemic VTI:  0.16 m MV E velocity: 54.50 cm/s  Systemic Diam: 1.90 cm MV A velocity: 89.60 cm/s MV E/A ratio:  0.61 Redell Shallow MD Electronically signed by Redell Shallow MD Signature Date/Time: 05/02/2024/4:17:23 PM    Final     ASSESSMENT & PLAN Jean Davidson is a 78 y.o.  female with medical history significant for relapsed/refractory multiple myeloma who presents for a follow  up visit.   # Relapsed/Refractory IgA Lambda Multiple Myeloma, In Remission -- Patient is undergone numerous prior therapies including autosomal stem cell transplant, allogenic stem cell transplant, CAR-T therapy, and now bispecific therapy --Patient completed her induction of bispecific therapy at [facility] [address] -- Patient presents to continue maintenance therapy. PLAN: --Labs today show white blood cell 5.5, hemoglobin 10.4, MCV 100.0, platelets 166. LFTs and creatinine adequate for  continued treatment --Most recent myeloma panel from 05/14/2024 showed M protein is not detectable.  --Okay to proceed with cycle 8-day 15 of Elrexfio  today --Plan for IVIG to be administered if IgG is less than 400.  Next IVIG will be scheduled at [facility] on 06/06/2024  -- Will plan for q 2 weekly maintenance Elrexfio  with clinic visits every treatment.   #Cough: --will obtain chest xray to further evaluate for infectious process --continue with mucinex  and recommend to try OTC antihistamine. Will send prescription for Tessalon  capsules.  --advised to follow up if she develops a persistent fever and/or worsening cough.   #Supportive Care -- zofran  8mg  q8H PRN and compazine  10mg  PO q6H for nausea -- acyclovir  400mg  PO BID for VCZ prophylaxis --Continue atovaquone  1500 mg twice daily -- IVIG to be administered when IgG are less than 400 -- EMLA  cream for port -- no pain medication required at this time.   No orders of the defined types were placed in this encounter.   All questions were answered. The patient knows to call the clinic with any problems, questions or concerns.   I have spent a total of 30 minutes minutes of face-to-face and non-face-to-face time, preparing to see the patient, performing a medically appropriate examination, counseling and educating the patient, documenting clinical information in the electronic health record, independently interpreting results and communicating results to the patient, and care coordination.   Johnston Police PA-C Dept of Hematology and Oncology [facility] Phone: [phone]   05/28/2024 10:21 AM

## 2024-05-29 ENCOUNTER — Other Ambulatory Visit: Payer: Self-pay | Admitting: Physician Assistant

## 2024-05-29 ENCOUNTER — Encounter: Payer: Self-pay | Admitting: Hematology and Oncology

## 2024-05-29 ENCOUNTER — Ambulatory Visit: Payer: Self-pay | Admitting: *Deleted

## 2024-05-29 MED ORDER — AZITHROMYCIN 250 MG PO TABS: ORAL_TABLET | ORAL | 0 refills | Status: DC

## 2024-05-29 NOTE — Telephone Encounter (Addendum)
 Contacted patient per Ms. Thayil's request with message below. Patient verbalized understanding of all information.   ----- Message from Johnston ONEIDA Police sent at 05/29/2024  4:51 PM EST ----- Please notify patient that chest xray shows questionable pneumonia. Will send Z pak. Please advise to monitor for worsening cough and/or fevers.  ----- Message ----- From: Interface, Rad Results In Sent: 05/29/2024   3:49 PM EST To: Johnston ONEIDA Police, PA-C

## 2024-06-01 ENCOUNTER — Other Ambulatory Visit: Payer: Self-pay

## 2024-06-02 ENCOUNTER — Encounter: Payer: Self-pay | Admitting: Hematology and Oncology

## 2024-06-02 ENCOUNTER — Telehealth: Payer: Self-pay

## 2024-06-02 NOTE — Telephone Encounter (Signed)
 Pt returned called.  Updated that Symptom Management can see her Wed, 12/10 at 9AM.  Pt agreed she could attend this appointment time.

## 2024-06-02 NOTE — Telephone Encounter (Signed)
 Following consultation with symptom management and Johnston Police. PA-C, it was determined to have pt come in to Symptom Management Wed, Dec 10, 9am, allowing the Z pack to take effect. TC to pt and left a message to return call to nurse for information responding to her phone call request.

## 2024-06-02 NOTE — Telephone Encounter (Signed)
 TC from pt who stated she rec'd  xray results  last Thursday evening reporting that I might have pneumonia.  A Z  pack was order.  Jean Davidson reports that her last dose is today. She is still coughing and wants to know what medication she should start on after ending the Z pack. She stated she could come in today to symptom management or to see Johnston.  She left a massage requesting call back for direction.  Johnston Police, PA-C made aware

## 2024-06-04 ENCOUNTER — Inpatient Hospital Stay: Admitting: Physician Assistant

## 2024-06-04 DIAGNOSIS — C9001 Multiple myeloma in remission: Secondary | ICD-10-CM | POA: Diagnosis not present

## 2024-06-04 DIAGNOSIS — R058 Other specified cough: Secondary | ICD-10-CM | POA: Diagnosis not present

## 2024-06-04 DIAGNOSIS — C9002 Multiple myeloma in relapse: Secondary | ICD-10-CM | POA: Diagnosis not present

## 2024-06-04 MED ORDER — LEVOFLOXACIN 750 MG PO TABS: 750.0000 mg | ORAL_TABLET | Freq: Every day | ORAL | 0 refills | Status: AC

## 2024-06-04 NOTE — Progress Notes (Signed)
 Symptom Management Consult Note [facility]    Patient Care Team: Patient, [name] as PCP - General (General Practice) Darron Deatrice LABOR, MD as PCP - Cardiology (Cardiology) Darren Liborio Backers, MD as Referring Physician (Hematology and Oncology) Guinevere File, MD as Consulting Physician (Internal Medicine) Fleeta Rothman, Jomarie SAILOR, MD as Consulting Physician (Infectious Diseases) Marda Maranda Foots An, MD as Consulting Physician (Hematology and Oncology) Honora Lenon Irving, NP as Nurse Practitioner (Hematology and Oncology) Federico Norleen ONEIDA MADISON, MD as Consulting Physician (Hematology and Oncology) [facility], Jon Garre, [place] as Physician Assistant (Cardiology)    Name / MRN / DOB: Jean Davidson  [id number]  05/24/1946   Date of visit: 06/04/2024   Chief Complaint/Reason for visit: cough   Current Therapy: maintenance Elrexfio    Last treatment:  Day 15   Cycle 8 on 05/28/24    ASSESSMENT AND PLAN Patient is a 78 y.o. female with oncologic history of relapsed/refractory multiple myeloma in remission followed by Dr. Federico.  I have viewed most recent oncology note and lab work.  #Relapsed/refractory multiple myeloma in remission - Next appointment with oncologist is 06/25/24 - Plan per last office visit was for IVIG to be administered if IgG is less than 400.  This would take place at [facility] on 06/06/2024.  #Cough - Chart review shows that patient was seen 05/28/2024 by oncologist for toxicity check.  At that visit she was reporting cough and had chest x-ray performed showing right basilar patchy opacity which may represent atelectasis, aspiration, or pneumonia.  Patient was treated with a Z-Pak for questionable pneumonia as well as Tessalon  Perles for symptom management. PE noted she had some wheezing and lower lobes bilaterally. - Today patient is overall well appearing. No hypoxia or tachycardia. Lung exam clear. No hypoxia with ambulation. - Patient feeling  improved although still with persistent cough and continuing to feel run down.  - Engaged in shared decision making with patient. Will try a course of levaquin  to extend antibitic coverage given her recent prolonged hospitalization and illness. If patient fails to improve would consider CT chest for further work up. Recommend Delsym  as well for symptom management.   Strict ED precautions discussed should symptoms worsen.   HEME/ONC HISTORY Oncology History Overview Note  Multiple myeloma, Ig A Lambda, M spike 3.54 grams, Calcium  9.2, Creatinine 0.8, Beta 2 microglobulin 4.52, IgA 4840 mg/dL, lambda light chain 24.5, albumin 3.6, hemoglobin 9.7, platelet 115    Primary site: Multiple Myeloma   Staging method: AJCC 6th Edition   Clinical: Stage IIA signed by Almarie Bedford, MD on 11/07/2013  2:46 PM   Summary: Stage IIA S/p Allo transplant at [facility] on revlimid , pomalyst , daratumumab  and velcade      Multiple myeloma in remission (HCC)  10/31/2013 Bone Marrow Biopsy   Bone marrow biopsy confirmed multiple myeloma with 40% bone marrow involvement. Skeletal survey showed minimal lesions in her score with generalized demineralization   11/10/2013 - 02/13/2014 Chemotherapy   The patient is started on induction chemotherapy with weekly dexamethasone  40 mg by mouth as well as Velcade  subcutaneous injection on days 1, 4, 8 and 11. On 11/21/2013, she was started on monthly Zometa .   12/23/2013 Adverse Reaction   The dose of Velcade  was reduced due to thrombocytopenia.   01/28/2014 - 04/07/2014 Chemotherapy   Revlimid  is added. Treatment was discontinued due to lack of response.   02/24/2014 - 04/07/2014 Chemotherapy   Due to worsening peripheral neuropathy, Velcade  injection  is changed to once a week. Revlimid  was given 21 days on, 7 days off.   04/07/2014 - 04/10/2014 Chemotherapy   Revlimid  was discontinued due to lack of response. Chemotherapy was changed back to Velcade  injection twice a  week, 2 weeks on 1 week off. Her treatment was switched to to minimum response   04/20/2014 - 06/02/2014 Chemotherapy   chemotherapy is switched to Carfilzomib , Cytoxan  and dexamethasone .   04/22/2014 Procedure   she has placement of port for chemotherapy.   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 at [facility]   10/20/2014 - 03/24/2015 Chemotherapy    she received chemotherapy with Kyprolis , Revlimid  and dexamethasone    10/22/2014 Procedure   She has port placement   01/19/2015 Tumor Marker   IgA lambda M spike at 0.4 g    01/20/2015 Miscellaneous   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 No cource was found and fever resolved with IV vancomycin  and meropenem    09/13/2015 Bone Marrow Biopsy   Bone marrow biopsy showed no increased blasts, 3-4 % plasma cells   03/02/2016 Bone Marrow Biopsy   Bone marrow biopsy at [facility] showed normocellular (30%) bone marrow with trilineage hematopoiesis. No significant increase in blasts. No significant increase in plasma cells.   05/12/2016 Imaging   DEXA scan at [facility] showed osteopenia   10/24/2016 Imaging   Skeletal survey  at [facility], no new lesions   12/07/2017 Imaging   No focal abnormality noted to suggest myeloma. Exam is stable from prior exam.   03/01/2018 Procedure   Successful 8 French right internal jugular vein power port placement with its tip at the SVC/RA junction.   03/06/2018 -  Chemotherapy   The patient had daratumumab     07/09/2018 Bone Marrow Biopsy   Bone marrow biopsy at [facility] showed residual disease at 0.004% plasma cells   07/03/2019 Bone Marrow Biopsy   A. Bone marrow, flow cytometric analysis for multiple myeloma minimal residual disease detection:   Negative. No phenotypically abnormal plasma cells at or above the limit of detection identified.     No monotypic B-cell population identified. Negative for increased blasts.   03/25/2021 Bone Marrow Biopsy   Repeat bone marrow biopsy at [facility] showed female donor karyotype.  5 to 7% plasma cell is seen.  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   04/20/2021 Echocardiogram   1. Left ventricular ejection fraction, by estimation, is 60 to 65%. The left ventricle has normal function. The left ventricle has no regional  wall motion abnormalities. Left ventricular diastolic parameters were normal.  2. Right ventricular systolic function is normal. The right ventricular size is normal.  3. The mitral valve is normal in structure. No evidence of mitral valve regurgitation. No evidence of mitral stenosis.  4. The aortic valve is normal in structure. Aortic valve regurgitation is not visualized. No aortic stenosis is present.  5. The inferior vena cava is normal in size with greater than 50% respiratory variability, suggesting right atrial pressure of 3 mmHg.   04/29/2021 - 02/17/2022 Chemotherapy   Patient is on Treatment Plan : MYELOMA RELAPSED/ REFRACTORY Carfilzomib  D1,8,15 (20/27) +  Dexamethasone  (KPd) q28d     04/30/2021 - 09/28/2022 Chemotherapy   Patient is on Treatment Plan : MYELOMA RELAPSED/REFRACTORY Carfilzomib  (20/70) D1,8,15 + Dexamethasone  weekly (40) (Kd) q28d  x 9 cycles / Dexamethasone  D1,8,15     05/04/2021 PET scan   1. No evidence active multiple myeloma within the skeleton on FDG PET scan. 2. No suspicious lytic lesion on CT portion exam. 3. No plasmacytoma.   10/09/2023 -  Chemotherapy   Patient is on Treatment Plan : MYELOMA WEEKLY elranatamab -bcmm SQ, step-up with weekly maintenance dosing then q14d dosing at week 25 if maintained response     01/02/2024 - 02/13/2024 Chemotherapy   Patient is on Treatment Plan : MYELOMA WEEKLY elranatamab -bcmm SQ, step-up with weekly maintenance dosing then q14d dosing at week 25 if maintained response     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HCC)  04/06/2015 Bone Marrow Biopsy   Accession: [id number] BM biopsy showed RAEB-1   04/06/2015 Tumor Marker   Cytogenetics and FISH for MDS are within normal limits   10/06/2015 - 10/10/2015 Chemotherapy   She received conditioning chemotherapy with busulfan and melphalan   10/12/2015 Bone Marrow Transplant   She received allogenic stem cell transplant   10/19/2015 Adverse Reaction   She developed posttransplant complication with mucositis, viral infection with rhinovirus, neutropenic fever, bilateral pleural effusion and moderate pericardial effusion and CMV reactivation.   10/31/2015 Miscellaneous   She has engrafted       INTERVAL HISTORY  Discussed the use of AI scribe software for clinical note transcription with the patient, who gave verbal consent to proceed.    Jean Davidson is a 78 y.o. female with oncologic history as above presenting to [facility] today with chief complaint of cough. Patient presents unaccompanied to visit today.  Approximately one month ago, she developed a persistent dry cough and profound fatigue following a significant  respiratory illness requiring hospitalization in September. The cough was initially deep and severe, now higher in the airway, non-productive, and worse when supine, often provoking heaves and occasional morning nausea managed with ondansetron . She uses two pillows and a neck pillow to sleep and prefers a lateral decubitus position. She denies wheezing, chest congestion, or productive sputum. She reports occasional clear rhinorrhea without nasal congestion. She has not had significant fevers, with only intermittent low-grade temperatures up to 99.16F, most recently the day prior to this visit.  She was prescribed a course of azithromycin  after a chest x-ray on December 3rd, with subjective improvement in respiratory symptoms. She previously used guaifenesin  but has discontinued guaifenesin . She is not currently taking any OTC cough medications. She denies new or worsening dyspnea.  She continues to experience fatigue, which she attributes to poor oral intake, ongoing recovery from illness, and chronic effects of myeloma therapy. Appetite remains poor, with dysgeusia limiting intake to a few palatable foods. She is drinking less than usual,  favoring carbonated beverages over water.  She is scheduled for IVIG infusion later this week at [facility]. She is eager to regain strength in anticipation of holiday travel to visit family.      ROS  All other systems are reviewed and are negative for acute change except as noted in the HPI.    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   Osteopenia    Spinal stenosis in cervical region    Varicose veins of lower extremities with inflammation      Past Surgical History:  Procedure Laterality Date   ANKLE SURGERY Left    BLADDER SUSPENSION  1988/1989   x2   COLONOSCOPY     ESOPHAGOGASTRODUODENOSCOPY     IR IMAGING GUIDED PORT INSERTION  03/01/2018   LIMBAL STEM CELL TRANSPLANT     x2   PORT-A-CATH REMOVAL     SHOULDER SURGERY Right 04/06/2013   right   UPPER GASTROINTESTINAL ENDOSCOPY      Social History   Socioeconomic History   Marital status: Divorced    Spouse name: Not on file   Number of children: Not on file   Years of education: Not on file   Highest education level: Not on file  Occupational History   Not on file  Tobacco Use   Smoking status: Former    Current packs/day: 0.00    Average packs/day: 0.5 packs/day for 4.0 years (2.0 ttl pk-yrs)    Types: Cigarettes    Start date: 06/26/1964    Quit date: 06/26/1968    Years since quitting: 55.9   Smokeless tobacco: Never  Vaping Use   Vaping status: Never Used  Substance and Sexual Activity   Alcohol  use: Yes    Alcohol /week: 1.0 standard drink of alcohol     Types: 1 Cans of beer per week    Comment: 1 per week per pt   Drug use: No   Sexual activity: Not on file  Other Topics Concern   Not on file  Social History Narrative   [name] is daughter jaquelyn, sakamoto will,  full code ( reviewed 53)   Social Drivers of Health   Financial Resource Strain: Low Risk  (10/09/2023)   Received from [facility]   Overall Financial Resource Strain (CARDIA)    Difficulty of Paying Living Expenses: Not hard at all  Food Insecurity: No Food Insecurity (03/09/2024)   Hunger Vital Sign    Worried About Running Out of Food in the Last Year: Never true    Ran Out of Food in the Last Year: Never true  Transportation Needs: No  Transportation Needs (03/09/2024)   PRAPARE - Administrator, Civil Service (Medical): No    Lack of Transportation (Non-Medical): No  Physical Activity: Not on file  Stress: Not on file  Social Connections: Moderately Integrated (  03/09/2024)   Social Connection and Isolation Panel    Frequency of Communication with Friends and Family: More than three times a week    Frequency of Social Gatherings with Friends and Family: Twice a week    Attends Religious Services: 1 to 4 times per year    Active Member of [employer] or Organizations: Yes    Attends Banker Meetings: 1 to 4 times per year    Marital Status: Divorced  Catering Manager Violence: Not At Risk (03/09/2024)   Humiliation, Afraid, Rape, and Kick questionnaire    Fear of Current or Ex-Partner: No    Emotionally Abused: No    Physically Abused: No    Sexually Abused: No    Family History  Problem Relation Age of Onset   Heart disease Mother    Heart failure Father    Heart disease Father    Throat cancer Father    Skin cancer Father    Diabetes Father    Kidney disease Father    Hypertension Brother    Heart disease Brother        congenital shunt, ?    Colon cancer Paternal Aunt 33   Skin cancer Brother    Stomach cancer Neg Hx    Esophageal cancer Neg Hx    Rectal cancer Neg Hx      Current Outpatient Medications:    benzonatate  (TESSALON ) 100 MG capsule, Take 1 capsule (100 mg total) by mouth 3 (three) times daily as needed for up to 7 days for cough., Disp: 20 capsule, Rfl: 0   levofloxacin  (LEVAQUIN ) 750 MG tablet, Take 1 tablet (750 mg total) by mouth daily for 7 days., Disp: 7 tablet, Rfl: 0   acyclovir  (ZOVIRAX ) 400 MG tablet, Take 1 tablet (400 mg total) by mouth 2 (two) times daily., Disp: 180 tablet, Rfl: 1   atovaquone  (MEPRON ) 750 MG/5ML suspension, TAKE 10 MLS (1,500 MG TOTAL) BY MOUTH 2 (TWO) TIMES DAILY., Disp: 420 mL, Rfl: 3   cholecalciferol  (VITAMIN D3) 25 MCG (1000 UNIT)  tablet, Take 4,000 Units by mouth daily. Instructed to increase to 4,000 units daily, Disp: , Rfl:    furosemide  (LASIX ) 20 MG tablet, Take 1 tablet (20 mg total) by mouth daily., Disp: 90 tablet, Rfl: 3   ivabradine  (CORLANOR) 5 MG TABS tablet, Take 2 tablets 2 hours prior to cardiac ct, Disp: 2 tablet, Rfl: 0   Magnesium  Citrate 200 MG TABS, Take 200 mg by mouth daily., Disp: 90 tablet, Rfl: 3   metoprolol  succinate (TOPROL -XL) 25 MG 24 hr tablet, Take 1.5 tablets (37.5 mg total) by mouth daily., Disp: 135 tablet, Rfl: 3   mirtazapine  (REMERON ) 15 MG tablet, TAKE 1 TABLET BY MOUTH EVERYDAY AT BEDTIME, Disp: 90 tablet, Rfl: 1   Multiple Vitamins-Minerals (PRESERVISION AREDS 2 PO), Take 1 tablet by mouth 2 (two) times daily., Disp: , Rfl:    ondansetron  (ZOFRAN ) 8 MG tablet, Take 1 tablet (8 mg total) by mouth every 8 (eight) hours as needed (Nausea or vomiting)., Disp: 60 tablet, Rfl: 1   pantoprazole  (PROTONIX ) 40 MG tablet, TAKE 1 TABLET BY MOUTH 2 TIMES DAILY. TAKE 30-60 MINUTES BEFORE BREAKFAST AND DINNER., Disp: 180 tablet, Rfl: 0   potassium chloride  SA (KLOR-CON  M20) 20 MEQ tablet, Take 1 tablet (20 mEq total) by mouth daily., Disp: 90 tablet, Rfl: 1   prochlorperazine  (COMPAZINE ) 5 MG tablet, Take 5 mg by mouth every 6 (six) hours as needed for nausea or vomiting., Disp: ,  Rfl:    senna-docusate (SENOKOT-S) 8.6-50 MG tablet, Take 2 tablets by mouth daily as needed., Disp: , Rfl:    spironolactone  (ALDACTONE ) 25 MG tablet, Take 0.5 tablets (12.5 mg total) by mouth daily., Disp: 90 tablet, Rfl: 3  PHYSICAL EXAM ECOG FS:1 - Symptomatic but completely ambulatory    Vitals:   06/04/24 0914  BP: 136/77  Pulse: 75  Resp: 18  Temp: (!) 97.3 F (36.3 C)  TempSrc: Temporal  SpO2: 98%  Weight: 151 lb 8 oz (68.7 kg)   Physical Exam Vitals and nursing note reviewed.  Constitutional:      Appearance: She is not ill-appearing or toxic-appearing.  HENT:     Head: Normocephalic.  Eyes:      Conjunctiva/sclera: Conjunctivae normal.  Cardiovascular:     Rate and Rhythm: Normal rate and regular rhythm.     Pulses: Normal pulses.     Heart sounds: Normal heart sounds.  Pulmonary:     Effort: Pulmonary effort is normal. No respiratory distress.     Breath sounds: Normal breath sounds. No stridor. No wheezing, rhonchi or rales.  Chest:     Chest wall: No tenderness.  Abdominal:     General: There is no distension.  Musculoskeletal:     Cervical back: Normal range of motion.  Skin:    General: Skin is warm and dry.  Neurological:     Mental Status: She is alert.        LABORATORY DATA I have reviewed the data as listed    Latest Ref Rng & Units 05/28/2024    9:13 AM 05/14/2024    8:45 AM 05/05/2024    1:03 PM  CBC  WBC 4.0 - 10.5 K/uL 5.5  5.2  3.9   Hemoglobin 12.0 - 15.0 g/dL 89.5  88.9  8.7   Hematocrit 36.0 - 46.0 % 32.4  34.0  27.4   Platelets 150 - 400 K/uL 166  146  109         Latest Ref Rng & Units 05/28/2024    9:13 AM 05/14/2024    8:45 AM 04/30/2024   12:37 PM  CMP  Glucose 70 - 99 mg/dL 876  882  890   BUN 8 - 23 mg/dL 10  15  11    Creatinine 0.44 - 1.00 mg/dL 9.08  9.02  8.91   Sodium 135 - 145 mmol/L 140  137  138   Potassium 3.5 - 5.1 mmol/L 3.8  3.9  3.5   Chloride 98 - 111 mmol/L 100  98  101   CO2 22 - 32 mmol/L 25  24  28    Calcium  8.9 - 10.3 mg/dL 9.5  9.5  9.1   Total Protein 6.5 - 8.1 g/dL 7.0  6.9  6.4   Total Bilirubin 0.0 - 1.2 mg/dL 0.3  0.4  0.3   Alkaline Phos 38 - 126 U/L 85  89  77   AST 15 - 41 U/L 24  26  18    ALT 0 - 44 U/L 6  7  9         RADIOGRAPHIC STUDIES (from last 24 hours if applicable) I have personally reviewed the radiological images as listed and agreed with the findings in the report. No results found.      Visit Diagnosis: 1. Other cough   2. Multiple myeloma in remission (HCC)      No orders of the defined types were placed in this encounter.   All questions  were answered. The patient  knows to call the clinic with any problems, questions or concerns. No barriers to learning was detected.  A total of more than 30 minutes were spent on this encounter with face-to-face time and non-face-to-face time, including preparing to see the patient, ordering tests and/or medications, counseling the patient and coordination of care as outlined above.    Thank you for allowing me to participate in the care of this patient.    Reynalda Canny E  Walisiewicz, PA-C Department of Hematology/Oncology [facility] at [facility] Phone: [phone]  [phone]    06/04/2024 11:47 AM

## 2024-06-04 NOTE — Patient Instructions (Signed)
 Delsym  is an over the counter cough suppressant.

## 2024-06-09 ENCOUNTER — Emergency Department (HOSPITAL_COMMUNITY)
Admission: EM | Admit: 2024-06-09 | Discharge: 2024-06-09 | Disposition: A | Discharge status: Home / Self Care | Source: Home / Self Care | Attending: Emergency Medicine | Admitting: Emergency Medicine

## 2024-06-09 ENCOUNTER — Telehealth: Payer: Self-pay | Admitting: Cardiovascular Disease

## 2024-06-09 ENCOUNTER — Emergency Department (HOSPITAL_COMMUNITY)

## 2024-06-09 ENCOUNTER — Other Ambulatory Visit: Payer: Self-pay

## 2024-06-09 ENCOUNTER — Encounter (HOSPITAL_COMMUNITY): Payer: Self-pay | Admitting: Emergency Medicine

## 2024-06-09 DIAGNOSIS — R638 Other symptoms and signs concerning food and fluid intake: Secondary | ICD-10-CM | POA: Diagnosis not present

## 2024-06-09 DIAGNOSIS — R531 Weakness: Secondary | ICD-10-CM | POA: Diagnosis not present

## 2024-06-09 DIAGNOSIS — R059 Cough, unspecified: Secondary | ICD-10-CM | POA: Diagnosis not present

## 2024-06-09 DIAGNOSIS — I5020 Unspecified systolic (congestive) heart failure: Secondary | ICD-10-CM | POA: Diagnosis not present

## 2024-06-09 DIAGNOSIS — Z79899 Other long term (current) drug therapy: Secondary | ICD-10-CM | POA: Diagnosis not present

## 2024-06-09 DIAGNOSIS — R0609 Other forms of dyspnea: Secondary | ICD-10-CM | POA: Diagnosis present

## 2024-06-09 DIAGNOSIS — R110 Nausea: Secondary | ICD-10-CM | POA: Diagnosis not present

## 2024-06-09 DIAGNOSIS — C9002 Multiple myeloma in relapse: Secondary | ICD-10-CM

## 2024-06-09 DIAGNOSIS — I110 Hypertensive heart disease with heart failure: Secondary | ICD-10-CM | POA: Diagnosis not present

## 2024-06-09 LAB — CBG MONITORING, ED: Glucose-Capillary: 124 mg/dL — ABNORMAL HIGH (ref 70–99)

## 2024-06-09 LAB — TROPONIN T, HIGH SENSITIVITY
Troponin T High Sensitivity: 58 ng/L — ABNORMAL HIGH (ref 0–19)
Troponin T High Sensitivity: 53 ng/L — ABNORMAL HIGH (ref 0–19)

## 2024-06-09 LAB — COMPREHENSIVE METABOLIC PANEL WITH GFR
ALT: 5 U/L (ref 0–44)
AST: 34 U/L (ref 15–41)
Albumin: 3.7 g/dL (ref 3.5–5.0)
Alkaline Phosphatase: 81 U/L (ref 38–126)
Anion gap: 12 (ref 5–15)
BUN: 13 mg/dL (ref 8–23)
CO2: 23 mmol/L (ref 22–32)
Calcium: 9 mg/dL (ref 8.9–10.3)
Chloride: 103 mmol/L (ref 98–111)
Creatinine, Ser: 0.92 mg/dL (ref 0.44–1.00)
GFR, Estimated: >60 mL/min (ref >60)
Glucose, Bld: 115 mg/dL — ABNORMAL HIGH (ref 70–99)
Potassium: 4 mmol/L (ref 3.5–5.1)
Sodium: 137 mmol/L (ref 135–145)
Total Bilirubin: 0.6 mg/dL (ref 0.0–1.2)
Total Protein: 6.6 g/dL (ref 6.5–8.1)

## 2024-06-09 LAB — CBC
HCT: 27.4 % — ABNORMAL LOW (ref 36.0–46.0)
Hemoglobin: 9.3 g/dL — ABNORMAL LOW (ref 12.0–15.0)
MCH: 33 pg (ref 26.0–34.0)
MCHC: 33.9 g/dL (ref 30.0–36.0)
MCV: 97.2 fL (ref 80.0–100.0)
Platelets: 162 K/uL (ref 150–400)
RBC: 2.82 MIL/uL — ABNORMAL LOW (ref 3.87–5.11)
RDW: 17 % — ABNORMAL HIGH (ref 11.5–15.5)
WBC: 3.3 K/uL — ABNORMAL LOW (ref 4.0–10.5)
nRBC: 0 % (ref 0.0–0.2)

## 2024-06-09 LAB — LIPASE, BLOOD: Lipase: 33 U/L (ref 11–51)

## 2024-06-09 MED ORDER — LACTATED RINGERS IV BOLUS
500.0000 mL | Freq: Once | INTRAVENOUS | Status: AC
  Administered 2024-06-09: 08:00:00 500 mL via INTRAVENOUS

## 2024-06-09 MED ORDER — ONDANSETRON HCL 4 MG/2ML IJ SOLN
4.0000 mg | Freq: Once | INTRAMUSCULAR | Status: AC
  Administered 2024-06-09: 08:00:00 4 mg via INTRAVENOUS
  Filled 2024-06-09: qty 2

## 2024-06-09 MED ORDER — SPIRONOLACTONE 25 MG PO TABS: 12.5000 mg | ORAL_TABLET | Freq: Every day | ORAL | 3 refills | Status: DC

## 2024-06-09 MED ORDER — LACTATED RINGERS IV BOLUS
500.0000 mL | Freq: Once | INTRAVENOUS | Status: AC
  Administered 2024-06-09: 10:00:00 500 mL via INTRAVENOUS

## 2024-06-09 MED ORDER — ONDANSETRON HCL 8 MG PO TABS: 8.0000 mg | ORAL_TABLET | Freq: Three times a day (TID) | ORAL | 1 refills | Status: AC | PRN

## 2024-06-09 NOTE — ED Triage Notes (Signed)
 Pt arrives w/ [facility] from home w/ c/o weakness. Hx multiple myeloma x 10 years. Denies fevers. VSS en route.  Last IVIG Friday

## 2024-06-09 NOTE — ED Provider Notes (Signed)
 [place] EMERGENCY DEPARTMENT AT [facility] Provider Note   CSN: [id number] Arrival date & time: 06/09/24  9348     Patient presents with: Weakness   Jean Davidson is a 78 y.o. female.   Pt is a 78y/o female with hx of relapsed/refractory multiple myeloma on immunotherapy and recently got IVIG on Friday, hypertension, GERD, and severe sepsis back in September from multifocal pneumonia, respiratory failure and new systolic heart failure requiring diuretics with an EF now of 60-65 % who is still continue on Lasix  10 mg and spironolactone  12.5 mg who is presenting today with complaints of generalized weakness and dyspnea on exertion.  Patient reports now for over a month she has had a dry persistent cough that started shortly after she returned home after rehab after a 2-week hospitalization for the above.  She was seen at [facility] and had an x-ray that was concerning for possible aspiration versus pneumonia and was treated with a Z-Pak.  She continued to have cough but symptoms had improved some and was seen last week and was given 7 days of Levaquin  which she has a few days left.  On Friday she got IVIG and reports that on Friday she was feeling well and the beginning of Saturday she was feeling okay but as Saturday continued she started to feel worse.  She has had ongoing nausea, poor oral intake and generalized weakness.  Now she noticed is being short of breath anytime she tries to do anything.  She has continued taking her medication and has had good urine output.  She denies any abdominal pain or vomiting.  She reports the cough is almost gone and she does not feel short of breath at rest or with lying down.  She has continued to lose weight but has not noticed any swelling in her legs.  She has not had a fever.  The history is provided by the patient and medical records.  Weakness      Prior to Admission medications  Medication Sig Start Date End Date Taking? Authorizing  Provider  acyclovir  (ZOVIRAX ) 400 MG tablet Take 1 tablet (400 mg total) by mouth 2 (two) times daily. 08/07/23   Lonn Hicks, MD  atovaquone  (MEPRON ) 750 MG/5ML suspension TAKE 10 MLS (1,500 MG TOTAL) BY MOUTH 2 (TWO) TIMES DAILY. 03/04/24   Federico Norleen ONEIDA MADISON, MD  cholecalciferol  (VITAMIN D3) 25 MCG (1000 UNIT) tablet Take 4,000 Units by mouth daily. Instructed to increase to 4,000 units daily    [provider]  furosemide  (LASIX ) 20 MG tablet Take 1 tablet (20 mg total) by mouth daily. 04/22/24   Duke, Jon Garre, PA  ivabradine  (CORLANOR) 5 MG TABS tablet Take 2 tablets 2 hours prior to cardiac ct 05/20/24   Duke, Jon Garre, PA  levofloxacin  (LEVAQUIN ) 750 MG tablet Take 1 tablet (750 mg total) by mouth daily for 7 days. 06/04/24 06/11/24  Walisiewicz, Kaitlyn E, PA-C  Magnesium  Citrate 200 MG TABS Take 200 mg by mouth daily. 04/22/24   Duke, Jon Garre, PA  metoprolol  succinate (TOPROL -XL) 25 MG 24 hr tablet Take 1.5 tablets (37.5 mg total) by mouth daily. 04/22/24   Duke, Jon Garre, PA  mirtazapine  (REMERON ) 15 MG tablet TAKE 1 TABLET BY MOUTH EVERYDAY AT BEDTIME 01/28/24   Dorsey, John T IV, MD  Multiple Vitamins-Minerals (PRESERVISION AREDS 2 PO) Take 1 tablet by mouth 2 (two) times daily.    [provider]  ondansetron  (ZOFRAN ) 8 MG tablet Take 1  tablet (8 mg total) by mouth every 8 (eight) hours as needed (Nausea or vomiting). 06/09/24   Doretha Folks, MD  pantoprazole  (PROTONIX ) 40 MG tablet TAKE 1 TABLET BY MOUTH 2 TIMES DAILY. TAKE 30-60 MINUTES BEFORE BREAKFAST AND DINNER. 03/06/24   Avram Lupita BRAVO, MD  potassium chloride  SA (KLOR-CON  M20) 20 MEQ tablet Take 1 tablet (20 mEq total) by mouth daily. 04/04/24 07/03/24  Madie Jon Garre, PA  prochlorperazine  (COMPAZINE ) 5 MG tablet Take 5 mg by mouth every 6 (six) hours as needed for nausea or vomiting.    [provider]  senna-docusate (SENOKOT-S) 8.6-50 MG tablet Take 2 tablets by mouth daily as  needed. 10/23/22   [provider]  spironolactone  (ALDACTONE ) 25 MG tablet Take 0.5 tablets (12.5 mg total) by mouth daily. 06/09/24   Doretha Folks, MD    Allergies: Patient has no known allergies.    Review of Systems  Neurological:  Positive for weakness.    Updated Vital Signs BP 111/60 (BP Location: Left Arm)   Pulse 84   Temp 98.2 F (36.8 C) (Oral)   Resp 14   SpO2 100%   Physical Exam Vitals and nursing note reviewed.  Constitutional:      General: She is not in acute distress.    Appearance: She is well-developed.     Comments: Frail-appearing female  HENT:     Head: Normocephalic and atraumatic.  Eyes:     Pupils: Pupils are equal, round, and reactive to light.  Cardiovascular:     Rate and Rhythm: Normal rate and regular rhythm.     Pulses: Normal pulses.     Heart sounds: Normal heart sounds. No murmur heard.    No friction rub.  Pulmonary:     Effort: Pulmonary effort is normal.     Breath sounds: Normal breath sounds. No wheezing or rales.  Abdominal:     General: Bowel sounds are normal. There is no distension.     Palpations: Abdomen is soft.     Tenderness: There is no abdominal tenderness. There is no guarding or rebound.  Musculoskeletal:        General: No tenderness. Normal range of motion.     Right lower leg: No edema.     Left lower leg: No edema.     Comments: No edema  Skin:    General: Skin is warm and dry.     Findings: No rash.  Neurological:     Mental Status: She is alert and oriented to person, place, and time. Mental status is at baseline.     Cranial Nerves: No cranial nerve deficit.  Psychiatric:        Behavior: Behavior normal.     (all labs ordered are listed, but only abnormal results are displayed) Labs Reviewed  COMPREHENSIVE METABOLIC PANEL WITH GFR - Abnormal; Notable for the following components:      Result Value   Glucose, Bld 115 (*)    All other components within normal limits  CBC - Abnormal;  Notable for the following components:   WBC 3.3 (*)    RBC 2.82 (*)    Hemoglobin 9.3 (*)    HCT 27.4 (*)    RDW 17.0 (*)    All other components within normal limits  CBG MONITORING, ED - Abnormal; Notable for the following components:   Glucose-Capillary 124 (*)    All other components within normal limits  TROPONIN T, HIGH SENSITIVITY - Abnormal; Notable for the following  components:   Troponin T High Sensitivity 58 (*)    All other components within normal limits  TROPONIN T, HIGH SENSITIVITY - Abnormal; Notable for the following components:   Troponin T High Sensitivity 53 (*)    All other components within normal limits  LIPASE, BLOOD    EKG: EKG Interpretation Date/Time:  Monday June 09 2024 07:38:23 EST Ventricular Rate:  79 PR Interval:  134 QRS Duration:  92 QT Interval:  375 QTC Calculation: 430 R Axis:   -3  Text Interpretation: Sinus rhythm Probable anterior infarct, age indeterminate No significant change since last tracing Confirmed by Doretha Folks ([id number]) on 06/09/2024 7:58:39 AM  Radiology: [facility] Chest 2 View Result Date: 06/09/2024 CLINICAL DATA:  Shortness of breath. EXAM: CHEST - 2 VIEW COMPARISON:  Chest radiograph dated 05/28/2024. FINDINGS: No focal consolidation, pleural effusion, pneumothorax. The cardiac silhouette is within limits. No acute osseous pathology. Right shoulder arthroplasty. IMPRESSION: No active cardiopulmonary disease. Electronically Signed   By: Vanetta Chou M.D.   On: 06/09/2024 09:34     Procedures   Medications Ordered in the ED  lactated ringers  bolus 500 mL (0 mLs Intravenous Stopped 06/09/24 0844)  ondansetron  (ZOFRAN ) injection 4 mg (4 mg Intravenous Given 06/09/24 0809)  lactated ringers  bolus 500 mL (500 mLs Intravenous New Bag/Given 06/09/24 1010)                                    Medical Decision Making Amount and/or Complexity of Data Reviewed External Data Reviewed: notes. Labs: ordered.  Decision-making details documented in ED Course. Radiology: ordered and independent interpretation performed. Decision-making details documented in ED Course. ECG/medicine tests: ordered and independent interpretation performed. Decision-making details documented in ED Course.  Risk Prescription drug management.   Pt with multiple medical problems and comorbidities and presenting today with a complaint that caries a high risk for morbidity and mortality.  Presenting today with the above complaints.  Patient has multiple medical problems and concern for dehydration, AKI, electrolyte abnormalities, anemia, complications of IVIG therapy and her multiple myeloma or medication reaction from recent antibiotics.  Patient is not displaying any signs consistent with CHF today and lower suspicion for sepsis however patient has been on 2 antibiotics and denies any fever with improvement in cough so lower suspicion for pneumonia at this time.  Patient has no focal abdominal pain to suspect an acute abdominal process.  Soft pressure on exam of 98/51.  Patient is only short of breath with activity and suspect it is related to weakness and dehydration and less likely to be PE, pleural effusions, pericardial effusion or STEMI.  11:31 AM I independently interpreted patient's labs and EKG.  CBC with minimal leukopenia with a white count of 3.3, stable hemoglobin of 9.3 and normal platelet count, CMP without acute findings, lipase is normal.  Patient's troponin is 58 and will get a second 2 to ensure it is her baseline.  EKG without acute findings today.  On reevaluation after getting IV fluids patient reports feeling better.  Also nausea has improved. I have independently visualized and interpreted pt's images today.  Chest x-ray without acute findings today.  11:31 AM Delta troponin is flat at 53.  On reevaluation after fluids patient reports she is feeling significantly better.  She was able to get up to a chair  reports that shortness of breath had also improved.  Will have patient hold furosemide  at  this time.  Encouraged her to follow-up with her doctors as planned.  Given return precautions.     Final diagnoses:  Weakness    ED Discharge Orders          Ordered    spironolactone  (ALDACTONE ) 25 MG tablet  Daily        06/09/24 1130    ondansetron  (ZOFRAN ) 8 MG tablet  Every 8 hours PRN        06/09/24 1130               Doretha Folks, MD 06/09/24 1131

## 2024-06-09 NOTE — Telephone Encounter (Signed)
 Pt c/o medication issue:  1. Name of Medication:   furosemide  (LASIX ) 20 MG tablet  potassium chloride  SA (KLOR-CON  M20) 20 MEQ tablet   2. How are you currently taking this medication (dosage and times per day)?   As prescribed  3. Are you having a reaction (difficulty breathing--STAT)?   4. What is your medication issue?    Patient called to report to A. Duke, PA that she is still on Lasix  and potassium and had to go to ED where it was determined she was dehydrated.  Patient wants to know if she can stop taking these medication.

## 2024-06-09 NOTE — Discharge Instructions (Addendum)
 Your lab work looks reassuring today.  You can hold the potassium and Lasix  and continue the spironolactone .  If you start having a fever, worsening shortness of breath, inability to eat or drink return to the emergency room

## 2024-06-10 ENCOUNTER — Encounter (HOSPITAL_COMMUNITY): Payer: Self-pay

## 2024-06-10 ENCOUNTER — Telehealth: Payer: Self-pay

## 2024-06-10 NOTE — Telephone Encounter (Signed)
 Pt called and left a message requesting a refill of Levoflaxacin.  Spoke with pt and she stated she will take her last dose tomorrow. She reported her cough is better but she feels weak and now requires a 24 hr/d caregiver.  Reports she is also taking a antibiotic for an UTI.  Dr Federico updated and stated he will plan to see her tomorrow during her infusion appointment.  Note to infusion nurse to notify Dr Federico when she is here for infusion.

## 2024-06-11 ENCOUNTER — Inpatient Hospital Stay

## 2024-06-11 VITALS — BP 139/77 | HR 99 | Temp 98.3°F | Resp 18

## 2024-06-11 DIAGNOSIS — C9002 Multiple myeloma in relapse: Secondary | ICD-10-CM | POA: Diagnosis not present

## 2024-06-11 DIAGNOSIS — C9001 Multiple myeloma in remission: Secondary | ICD-10-CM

## 2024-06-11 LAB — CMP (CANCER CENTER ONLY)
ALT: 6 U/L (ref 0–44)
AST: 26 U/L (ref 15–41)
Albumin: 4 g/dL (ref 3.5–5.0)
Alkaline Phosphatase: 82 U/L (ref 38–126)
Anion gap: 13 (ref 5–15)
BUN: 15 mg/dL (ref 8–23)
CO2: 23 mmol/L (ref 22–32)
Calcium: 9.3 mg/dL (ref 8.9–10.3)
Chloride: 102 mmol/L (ref 98–111)
Creatinine: 1.1 mg/dL — ABNORMAL HIGH (ref 0.44–1.00)
GFR, Estimated: 51 mL/min — ABNORMAL LOW (ref >60)
Glucose, Bld: 124 mg/dL — ABNORMAL HIGH (ref 70–99)
Potassium: 3.5 mmol/L (ref 3.5–5.1)
Sodium: 139 mmol/L (ref 135–145)
Total Bilirubin: 0.6 mg/dL (ref 0.0–1.2)
Total Protein: 6.8 g/dL (ref 6.5–8.1)

## 2024-06-11 LAB — CBC WITH DIFFERENTIAL (CANCER CENTER ONLY)
Abs Immature Granulocytes: 0.13 K/uL — ABNORMAL HIGH (ref 0.00–0.07)
Basophils Absolute: 0 K/uL (ref 0.0–0.1)
Basophils Relative: 0 %
Eosinophils Absolute: 0 K/uL (ref 0.0–0.5)
Eosinophils Relative: 0 %
HCT: 26.2 % — ABNORMAL LOW (ref 36.0–46.0)
Hemoglobin: 8.9 g/dL — ABNORMAL LOW (ref 12.0–15.0)
Immature Granulocytes: 4 %
Lymphocytes Relative: 46 %
Lymphs Abs: 1.6 K/uL (ref 0.7–4.0)
MCH: 32.5 pg (ref 26.0–34.0)
MCHC: 34 g/dL (ref 30.0–36.0)
MCV: 95.6 fL (ref 80.0–100.0)
Monocytes Absolute: 0.6 K/uL (ref 0.1–1.0)
Monocytes Relative: 18 %
Neutro Abs: 1.1 K/uL — ABNORMAL LOW (ref 1.7–7.7)
Neutrophils Relative %: 32 %
Platelet Count: 170 K/uL (ref 150–400)
RBC: 2.74 MIL/uL — ABNORMAL LOW (ref 3.87–5.11)
RDW: 17.2 % — ABNORMAL HIGH (ref 11.5–15.5)
WBC Count: 3.5 K/uL — ABNORMAL LOW (ref 4.0–10.5)
nRBC: 0 % (ref 0.0–0.2)

## 2024-06-11 LAB — MAGNESIUM: Magnesium: 1.9 mg/dL (ref 1.7–2.4)

## 2024-06-11 MED ORDER — ELRANATAMAB-BCMM 76 MG/1.9ML ~~LOC~~ SOLN
76.0000 mg | Freq: Once | SUBCUTANEOUS | Status: AC
  Administered 2024-06-11: 12:00:00 76 mg via SUBCUTANEOUS
  Filled 2024-06-11: qty 1.9

## 2024-06-11 MED ORDER — SODIUM CHLORIDE 0.9 % IV SOLN: Freq: Once | INTRAVENOUS | Status: AC

## 2024-06-11 NOTE — Patient Instructions (Signed)
 [facility] - A DEPT OF [facility]  Discharge Instructions: Thank you for choosing [facility] to provide your oncology and hematology care.   If you have a lab appointment with the Cancer Center, please go directly to the Cancer Center and check in at the registration area.   Wear comfortable clothing and clothing appropriate for easy access to any Portacath or PICC line.   We strive to give you quality time with your provider. You may need to reschedule your appointment if you arrive late (15 or more minutes).  Arriving late affects you and other patients whose appointments are after yours.  Also, if you miss three or more appointments without notifying the office, you may be dismissed from the clinic at the provider's discretion.      For prescription refill requests, have your pharmacy contact our office and allow 72 hours for refills to be completed.    Today you received the following chemotherapy and/or immunotherapy agents: elranatamab -bcmm (ELREXFIO )     To help prevent nausea and vomiting after your treatment, we encourage you to take your nausea medication as directed.  BELOW ARE SYMPTOMS THAT SHOULD BE REPORTED IMMEDIATELY: *FEVER GREATER THAN 100.4 F (38 C) OR HIGHER *CHILLS OR SWEATING *NAUSEA AND VOMITING THAT IS NOT CONTROLLED WITH YOUR NAUSEA MEDICATION *UNUSUAL SHORTNESS OF BREATH *UNUSUAL BRUISING OR BLEEDING *URINARY PROBLEMS (pain or burning when urinating, or frequent urination) *BOWEL PROBLEMS (unusual diarrhea, constipation, pain near the anus) TENDERNESS IN MOUTH AND THROAT WITH OR WITHOUT PRESENCE OF ULCERS (sore throat, sores in mouth, or a toothache) UNUSUAL RASH, SWELLING OR PAIN  UNUSUAL VAGINAL DISCHARGE OR ITCHING   Items with * indicate a potential emergency and should be followed up as soon as possible or go to the Emergency Department if any problems should occur.  Please show the CHEMOTHERAPY ALERT CARD  or IMMUNOTHERAPY ALERT CARD at check-in to the Emergency Department and triage nurse.  Should you have questions after your visit or need to cancel or reschedule your appointment, please contact [facility] - A DEPT OF [facility]  Dept: [phone]  and follow the prompts.  Office hours are 8:00 a.m. to 4:30 p.m. Monday - Friday. Please note that voicemails left after 4:00 p.m. may not be returned until the following business day.  We are closed weekends and major holidays. You have access to a nurse at all times for urgent questions. Please call the main number to the clinic Dept: [phone] and follow the prompts.   For any non-urgent questions, you may also contact your provider using MyChart. We now offer e-Visits for anyone 59 and older to request care online for non-urgent symptoms. For details visit [email or web address].   Also download the MyChart app! Go to the app store, search MyChart, open the app, select [place], and log in with your MyChart username and password.

## 2024-06-12 ENCOUNTER — Telehealth: Payer: Self-pay | Admitting: Physician Assistant

## 2024-06-12 ENCOUNTER — Ambulatory Visit (HOSPITAL_BASED_OUTPATIENT_CLINIC_OR_DEPARTMENT_OTHER): Admission: RE | Admit: 2024-06-12 | Source: Ambulatory Visit

## 2024-06-12 ENCOUNTER — Other Ambulatory Visit: Payer: Self-pay | Admitting: Cardiology

## 2024-06-12 ENCOUNTER — Ambulatory Visit (HOSPITAL_COMMUNITY)

## 2024-06-12 DIAGNOSIS — I471 Supraventricular tachycardia, unspecified: Secondary | ICD-10-CM | POA: Diagnosis present

## 2024-06-12 DIAGNOSIS — R931 Abnormal findings on diagnostic imaging of heart and coronary circulation: Secondary | ICD-10-CM | POA: Insufficient documentation

## 2024-06-12 DIAGNOSIS — I10 Essential (primary) hypertension: Secondary | ICD-10-CM | POA: Diagnosis present

## 2024-06-12 DIAGNOSIS — I340 Nonrheumatic mitral (valve) insufficiency: Secondary | ICD-10-CM | POA: Insufficient documentation

## 2024-06-12 DIAGNOSIS — I5022 Chronic systolic (congestive) heart failure: Secondary | ICD-10-CM | POA: Insufficient documentation

## 2024-06-12 LAB — KAPPA/LAMBDA LIGHT CHAINS
Kappa free light chain: 0.8 mg/L — ABNORMAL LOW (ref 3.3–19.4)
Kappa, lambda light chain ratio: >0.53 (ref 0.26–1.65)
Lambda free light chains: <1.5 mg/L — ABNORMAL LOW (ref 5.7–26.3)

## 2024-06-12 MED ORDER — IOHEXOL 350 MG/ML SOLN
100.0000 mL | Freq: Once | INTRAVENOUS | Status: AC | PRN
  Administered 2024-06-12: 09:00:00 100 mL via INTRAVENOUS

## 2024-06-12 MED ORDER — NITROGLYCERIN 0.4 MG SL SUBL
0.8000 mg | SUBLINGUAL_TABLET | Freq: Once | SUBLINGUAL | Status: AC
  Administered 2024-06-12: 09:00:00 0.8 mg via SUBLINGUAL

## 2024-06-12 NOTE — Telephone Encounter (Addendum)
 Dr. Michele is reading studies today and reached out to me on call regarding abnormal coronary CTA result as Angie Duke PA-C is out of the office today. Coronary CTA raised question of significant stenosis at the proximal to mid LAD as well as question of LCx disease.  We discussed case. Per chart review, patient has complex PMH including ongoing management of relapsed multiple myeloma with prior stem cell treatments receiving infusions, anemia, urinary retention, recently diagnosed heart failure 02/2024 with subsequent normalization of LVEF 04/2024, debilitation requiring rollator.   I called her to notify her of the preliminary result and advised return to the hospital if she develops any new chest pain or concerning symptoms, but her phone only went to voicemail. I sent her a MyChart message with this information.  She called back and we discussed the above. She states that she has been battling a URI and was seen in the ED 06/09/24 with weakness, dyspnea, and soft BP, hsTroponins low/flat at 58-53 felt reassuring by EDP. She improved with IV fluids and was recommended to discontinue Lasix  and potassium. I reiterated that she should return to the hospital with any chest pain, progressive dyspnea or any worsening symptoms. She states thankfully she feels well today and verbalized understanding of recommendation. Per discussion with Dr. Michele, decision regarding pursuit of cardiac cath best served coming from provider that evaluated the patient. Given her chronic anemia we will defer to Angie whether she would recommend start of aspirin . The patient states Angie previously told her to stop aspirin  though I do not see this mentioned in recent notes. Notes indicate patient remotely hesitant towards statin therapy, should be considered in follow-up. Will cc to Angie for further review upon her return.  I see where in a previous message chain last night she advised the patient have close follow-up; not yet scheduled.  I will also forward to [facility] schedulers to try to get patient in for expedited follow-up. Patient lives in [place] and is a primary patient of Dr. Darron, can offer either [place] or [facility] but suspect [place] has better availability.

## 2024-06-14 LAB — MULTIPLE MYELOMA PANEL, SERUM
Albumin SerPl Elph-Mcnc: 3.2 g/dL (ref 2.9–4.4)
Albumin/Glob SerPl: 1.1 (ref 0.7–1.7)
Alpha 1: 0.3 g/dL (ref 0.0–0.4)
Alpha2 Glob SerPl Elph-Mcnc: 1 g/dL (ref 0.4–1.0)
B-Globulin SerPl Elph-Mcnc: 1 g/dL (ref 0.7–1.3)
Gamma Glob SerPl Elph-Mcnc: 0.8 g/dL (ref 0.4–1.8)
Globulin, Total: 3.1 g/dL (ref 2.2–3.9)
IgA: <5 mg/dL — ABNORMAL LOW (ref 64–422)
IgG (Immunoglobin G), Serum: 803 mg/dL (ref 586–1602)
IgM (Immunoglobulin M), Srm: <5 mg/dL — ABNORMAL LOW (ref 26–217)
Total Protein ELP: 6.3 g/dL (ref 6.0–8.5)

## 2024-06-16 ENCOUNTER — Ambulatory Visit: Payer: Self-pay | Admitting: Physician Assistant

## 2024-06-20 ENCOUNTER — Encounter: Payer: Self-pay | Admitting: Medical

## 2024-06-20 ENCOUNTER — Ambulatory Visit: Attending: Medical | Admitting: Medical

## 2024-06-20 ENCOUNTER — Other Ambulatory Visit: Payer: Self-pay

## 2024-06-20 VITALS — BP 116/64 | HR 101 | Ht 62.0 in | Wt 150.4 lb

## 2024-06-20 DIAGNOSIS — C9002 Multiple myeloma in relapse: Secondary | ICD-10-CM

## 2024-06-20 DIAGNOSIS — I10 Essential (primary) hypertension: Secondary | ICD-10-CM

## 2024-06-20 DIAGNOSIS — R931 Abnormal findings on diagnostic imaging of heart and coronary circulation: Secondary | ICD-10-CM

## 2024-06-20 DIAGNOSIS — I25110 Atherosclerotic heart disease of native coronary artery with unstable angina pectoris: Secondary | ICD-10-CM | POA: Diagnosis not present

## 2024-06-20 DIAGNOSIS — M6289 Other specified disorders of muscle: Secondary | ICD-10-CM

## 2024-06-20 DIAGNOSIS — I471 Supraventricular tachycardia, unspecified: Secondary | ICD-10-CM | POA: Diagnosis not present

## 2024-06-20 DIAGNOSIS — R0609 Other forms of dyspnea: Secondary | ICD-10-CM

## 2024-06-20 DIAGNOSIS — I5022 Chronic systolic (congestive) heart failure: Secondary | ICD-10-CM

## 2024-06-20 NOTE — Addendum Note (Signed)
 Addended by: DESIDERIO RUSSELL SAILOR on: 06/20/2024 09:24 AM   Modules accepted: Orders

## 2024-06-20 NOTE — Progress Notes (Signed)
 " Cardiology Office Note   Date:  06/20/2024  ID:  Jean Davidson, DOB 04/19/1946, MRN [id number] PCP: Patient, [name]  [facility] Providers Cardiologist:  Deatrice Cage, MD Cardiology APP:  Madie Jon Garre, PA    History of Present Illness Jean Davidson is a 78 y.o. female with a h/o HTN, paroxysmal SVT, remote large pericardial effusion resolved without intervention, mild carotid stenosis, HTN, GERD, chronic anemia, relapsed/refractory multiple myeloma on Elrexfio  who presents for cardiac CTA follow-up.   She was initially referred to cardiology in 07/04/15 for palpitations with a Holter monitor showing normal sinus rhythm with infrequent PVCs and PACs.  POET at that time was normal.  Repeat heart monitor in 2020 for palpitations showed multiple runs of SVT.  She was transition from Coreg  to metoprolol  and did well.  Echocardiogram in Jul 03, 2016 showed large pericardial effusion managed by [facility] cardiology, effusion resolved without intervention.  She was recently admitted in September 2025 with severe sepsis and acute hypoxic respiratory failure secondary to pneumonia.  Echocardiogram showed LVEF 35 to 40% with no wall motion abnormalities, grade 1 diastolic dysfunction, normal RV, moderate MR.  She was diuresed and discharged on Lasix  80 mg daily, 25 mg of Toprol .  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  Due to her strong family history of CAD, decision was made to defer ischemic evaluation in the setting of severe illness and revisit coronary CTA once recovered.  Patient was seen in follow-up coming [facility] the [facility].  She was having issues with recurrent UTIs.  She was started on Lasix  40 mg a day.  She was started on spironolactone .  Repeat echocardiogram November 2025 showed EF of 60 to 65%.  She was last seen 05/20/2024 using her rollator and perpetually tired, but slowly improving. Cardiac CTA showed coronary calcium  score of 1190, 94th  percentile for age and sex matched control, severe CAD. FFR showed significant stenosis at proximal to mid LAD, and flow-limiting disease in the distal left circumflex likely secondary to slow flow degradation due to vessel tapering rather than obstructive disease.  Patient was brought in for discussion of heart cath.  Today, the patient reports she has been improving. She does not have chest pain. She has chronic DOE that she feels has been worse in the last 4-5 months. She has a foley and was going to get a surgery for a bladder lift, but is Ok with waiting in the setting of heart cath.   Studies Reviewed     Cardiac CTA 07/03/2024  1. Coronary calcium  score of 1190. This was 94th percentile for age and sex matched control.   2. Normal coronary origins with right dominance.   3. CAD-RADS 4 Severe coronary disease.   4. CT FFR will be performed and reported separately further evaluate left main/LAD distribution.   5. Aortic atherosclerosis.   6.  Mitral annular calcification, correlate with echocardiography IMPRESSION: 1. CT FFR analysis showed significant stenosis at proximal to mid LAD.   2. CT FFR analysis illustrates flow-limiting disease in the distal LCX, this is likely secondary to slow flow degradation due to vessel tapering rather than obstructive disease. Clinical correlation required.  Echo 04/2024 1. Left ventricular ejection fraction, by estimation, is 60 to 65%. The  left ventricle has normal function. The left ventricle has no regional  wall motion abnormalities. Left ventricular diastolic parameters are  indeterminate.   2. Right ventricular systolic function is normal. The right ventricular  size is normal.   3. The mitral valve is normal in structure. No evidence of mitral valve  regurgitation. No evidence of mitral stenosis. Moderate mitral annular  calcification.   4. The aortic valve is tricuspid. Aortic valve regurgitation is not  visualized. No aortic  stenosis is present.   5. The inferior vena cava is normal in size with greater than 50%  respiratory variability, suggesting right atrial pressure of 3 mmHg.   Echo 02/2024 1. Left ventricular ejection fraction, by estimation, is 35 to 40%. The  left ventricle has moderately decreased function. The left ventricle has  no regional wall motion abnormalities. Left ventricular diastolic  parameters are consistent with Grade I  diastolic dysfunction (impaired relaxation).   2. Right ventricular systolic function is normal. The right ventricular  size is normal. There is mildly elevated pulmonary artery systolic  pressure.   3. The mitral valve is normal in structure. Moderate mitral valve  regurgitation. No evidence of mitral stenosis.   4. The aortic valve is normal in structure. Aortic valve regurgitation is  not visualized. No aortic stenosis is present.   5. The inferior vena cava is normal in size with greater than 50%  respiratory variability, suggesting right atrial pressure of 3 mmHg.   Echo 03/2021 1. Left ventricular ejection fraction, by estimation, is 60 to 65%. The  left ventricle has normal function. The left ventricle has no regional  wall motion abnormalities. Left ventricular diastolic parameters were  normal.   2. Right ventricular systolic function is normal. The right ventricular  size is normal.   3. The mitral valve is normal in structure. No evidence of mitral valve  regurgitation. No evidence of mitral stenosis.   4. The aortic valve is normal in structure. Aortic valve regurgitation is  not visualized. No aortic stenosis is present.   5. The inferior vena cava is normal in size with greater than 50%  respiratory variability, suggesting right atrial pressure of 3 mmHg.   Comparison(s): LVEF 60-65.       Physical Exam VS:  BP 116/64   Pulse (!) 101   Ht 5' 2 (1.575 m)   Wt 150 lb 6.4 oz (68.2 kg)   SpO2 99%   BMI 27.51 kg/m        Wt Readings from  Last 3 Encounters:  06/20/24 150 lb 6.4 oz (68.2 kg)  06/04/24 151 lb 8 oz (68.7 kg)  05/28/24 148 lb 12.8 oz (67.5 kg)    GEN: Well nourished, well developed in no acute distress NECK: No JVD; No carotid bruits CARDIAC: RRR, no murmurs, rubs, gallops RESPIRATORY:  Clear to auscultation without rales, wheezing or rhonchi  ABDOMEN: Soft, non-tender, non-distended EXTREMITIES:  No edema; No deformity   ASSESSMENT AND PLAN  Abnormal Cardiac CTA CAD Cardiac CTA showed coronary calcium  score of 1190, 94th percentile for age and sex matched, severe coronary artery disease, aortic atherosclerosis, mitral annular calcification.  FFR showed significant stenosis at proximal to mid LAD and possible flow-limiting disease in the distal left circumflex, likely secondary to slow flow degradation due to vessel tapering rather than obstructive disease.  Patient denies any chest pain, but has chronic dyspnea on exertion that has been getting worse over the last few months.  Patient has a history of multiple myeloma and gets infusions monthly.  Hemoglobin is 8-9.  I will set her up for a left heart cath, will defer blood thinners to MD.  Risks and benefits of cardiac catheterization have  been discussed with the patient.  These include bleeding, infection, kidney damage, stroke, heart attack, death.  The patient understands these risks and is willing to proceed.  Chronic systolic heart failure LVEF 35 to 40% on echo in September 2025, improved with GDMT by echo 05/15/2024 with EF 60 to 65%.  Patient is euvolemic on exam.  Continue Toprol  37.5 mg nightly and spironolactone  25 mg daily.  She is not taking Lasix  daily.  Not a candidate for SGLT2 inhibitor due to recurrent UTI.  Hypertension Blood pressure well-controlled, continue current medications  Paroxysmal SVT Patient denies palpitations.  Continue Toprol  37.5 mg at night.  Multiple myeloma Patient does monthly infusions and shots every other week.  She  has chronic fatigue.  Foley catheter Patient reported she needs possible bladder lift, this would be her third 1.  She is okay with deferring this in the setting of heart cath with possible stenting.    Dispo: Follow-up after heart cath  Signed, Loxley Cibrian VEAR Fishman, PA-C   "

## 2024-06-20 NOTE — H&P (View-Only) (Signed)
 " Cardiology Office Note   Date:  06/20/2024  ID:  Jean Davidson, DOB 04/19/1946, MRN [id number] PCP: Patient, [name]  [facility] Providers Cardiologist:  Deatrice Cage, MD Cardiology APP:  Madie Jon Garre, PA    History of Present Illness Jean Davidson is a 78 y.o. female with a h/o HTN, paroxysmal SVT, remote large pericardial effusion resolved without intervention, mild carotid stenosis, HTN, GERD, chronic anemia, relapsed/refractory multiple myeloma on Elrexfio  who presents for cardiac CTA follow-up.   She was initially referred to cardiology in 07/04/15 for palpitations with a Holter monitor showing normal sinus rhythm with infrequent PVCs and PACs.  POET at that time was normal.  Repeat heart monitor in 2020 for palpitations showed multiple runs of SVT.  She was transition from Coreg  to metoprolol  and did well.  Echocardiogram in Jul 03, 2016 showed large pericardial effusion managed by [facility] cardiology, effusion resolved without intervention.  She was recently admitted in September 2025 with severe sepsis and acute hypoxic respiratory failure secondary to pneumonia.  Echocardiogram showed LVEF 35 to 40% with no wall motion abnormalities, grade 1 diastolic dysfunction, normal RV, moderate MR.  She was diuresed and discharged on Lasix  80 mg daily, 25 mg of Toprol .  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  Due to her strong family history of CAD, decision was made to defer ischemic evaluation in the setting of severe illness and revisit coronary CTA once recovered.  Patient was seen in follow-up [facility].  She was having issues with recurrent UTIs.  She was started on Lasix  40 mg a day.  She was started on spironolactone .  Repeat echocardiogram November 2025 showed EF of 60 to 65%.  She was last seen 05/20/2024 using her rollator and perpetually tired, but slowly improving. Cardiac CTA showed coronary calcium  score of 1190, 94th  percentile for age and sex matched control, severe CAD. FFR showed significant stenosis at proximal to mid LAD, and flow-limiting disease in the distal left circumflex likely secondary to slow flow degradation due to vessel tapering rather than obstructive disease.  Patient was brought in for discussion of heart cath.  Today, the patient reports she has been improving. She does not have chest pain. She has chronic DOE that she feels has been worse in the last 4-5 months. She has a foley and was going to get a surgery for a bladder lift, but is Ok with waiting in the setting of heart cath.   Studies Reviewed     Cardiac CTA 07/03/2024  1. Coronary calcium  score of 1190. This was 94th percentile for age and sex matched control.   2. Normal coronary origins with right dominance.   3. CAD-RADS 4 Severe coronary disease.   4. CT FFR will be performed and reported separately further evaluate left main/LAD distribution.   5. Aortic atherosclerosis.   6.  Mitral annular calcification, correlate with echocardiography IMPRESSION: 1. CT FFR analysis showed significant stenosis at proximal to mid LAD.   2. CT FFR analysis illustrates flow-limiting disease in the distal LCX, this is likely secondary to slow flow degradation due to vessel tapering rather than obstructive disease. Clinical correlation required.  Echo 04/2024 1. Left ventricular ejection fraction, by estimation, is 60 to 65%. The  left ventricle has normal function. The left ventricle has no regional  wall motion abnormalities. Left ventricular diastolic parameters are  indeterminate.   2. Right ventricular systolic function is normal. The right ventricular  size is normal.   3. The mitral valve is normal in structure. No evidence of mitral valve  regurgitation. No evidence of mitral stenosis. Moderate mitral annular  calcification.   4. The aortic valve is tricuspid. Aortic valve regurgitation is not  visualized. No aortic  stenosis is present.   5. The inferior vena cava is normal in size with greater than 50%  respiratory variability, suggesting right atrial pressure of 3 mmHg.   Echo 02/2024 1. Left ventricular ejection fraction, by estimation, is 35 to 40%. The  left ventricle has moderately decreased function. The left ventricle has  no regional wall motion abnormalities. Left ventricular diastolic  parameters are consistent with Grade I  diastolic dysfunction (impaired relaxation).   2. Right ventricular systolic function is normal. The right ventricular  size is normal. There is mildly elevated pulmonary artery systolic  pressure.   3. The mitral valve is normal in structure. Moderate mitral valve  regurgitation. No evidence of mitral stenosis.   4. The aortic valve is normal in structure. Aortic valve regurgitation is  not visualized. No aortic stenosis is present.   5. The inferior vena cava is normal in size with greater than 50%  respiratory variability, suggesting right atrial pressure of 3 mmHg.   Echo 03/2021 1. Left ventricular ejection fraction, by estimation, is 60 to 65%. The  left ventricle has normal function. The left ventricle has no regional  wall motion abnormalities. Left ventricular diastolic parameters were  normal.   2. Right ventricular systolic function is normal. The right ventricular  size is normal.   3. The mitral valve is normal in structure. No evidence of mitral valve  regurgitation. No evidence of mitral stenosis.   4. The aortic valve is normal in structure. Aortic valve regurgitation is  not visualized. No aortic stenosis is present.   5. The inferior vena cava is normal in size with greater than 50%  respiratory variability, suggesting right atrial pressure of 3 mmHg.   Comparison(s): LVEF 60-65.       Physical Exam VS:  BP 116/64   Pulse (!) 101   Ht 5' 2 (1.575 m)   Wt 150 lb 6.4 oz (68.2 kg)   SpO2 99%   BMI 27.51 kg/m        Wt Readings from  Last 3 Encounters:  06/20/24 150 lb 6.4 oz (68.2 kg)  06/04/24 151 lb 8 oz (68.7 kg)  05/28/24 148 lb 12.8 oz (67.5 kg)    GEN: Well nourished, well developed in no acute distress NECK: No JVD; No carotid bruits CARDIAC: RRR, no murmurs, rubs, gallops RESPIRATORY:  Clear to auscultation without rales, wheezing or rhonchi  ABDOMEN: Soft, non-tender, non-distended EXTREMITIES:  No edema; No deformity   ASSESSMENT AND PLAN  Abnormal Cardiac CTA CAD Cardiac CTA showed coronary calcium  score of 1190, 94th percentile for age and sex matched, severe coronary artery disease, aortic atherosclerosis, mitral annular calcification.  FFR showed significant stenosis at proximal to mid LAD and possible flow-limiting disease in the distal left circumflex, likely secondary to slow flow degradation due to vessel tapering rather than obstructive disease.  Patient denies any chest pain, but has chronic dyspnea on exertion that has been getting worse over the last few months.  Patient has a history of multiple myeloma and gets infusions monthly.  Hemoglobin is 8-9.  I will set her up for a left heart cath, will defer blood thinners to MD.  Risks and benefits of cardiac catheterization have  been discussed with the patient.  These include bleeding, infection, kidney damage, stroke, heart attack, death.  The patient understands these risks and is willing to proceed.  Chronic systolic heart failure LVEF 35 to 40% on echo in September 2025, improved with GDMT by echo 05/15/2024 with EF 60 to 65%.  Patient is euvolemic on exam.  Continue Toprol  37.5 mg nightly and spironolactone  25 mg daily.  She is not taking Lasix  daily.  Not a candidate for SGLT2 inhibitor due to recurrent UTI.  Hypertension Blood pressure well-controlled, continue current medications  Paroxysmal SVT Patient denies palpitations.  Continue Toprol  37.5 mg at night.  Multiple myeloma Patient does monthly infusions and shots every other week.  She  has chronic fatigue.  Foley catheter Patient reported she needs possible bladder lift, this would be her third 1.  She is okay with deferring this in the setting of heart cath with possible stenting.    Dispo: Follow-up after heart cath  Signed, Loxley Cibrian VEAR Fishman, PA-C   "

## 2024-06-20 NOTE — Patient Instructions (Addendum)
 Medication Instructions:  Your physician recommends that you continue on your current medications as directed. Please refer to the Current Medication list given to you today.    *If you need a refill on your cardiac medications before your next appointment, please call your pharmacy*  Lab Work: Your provider would like for you to have following labs drawn today CBC.     Testing/Procedures:  [facility] [place] [place] [address] [place] [address] Dept: [phone] Loc: [phone]  Jean Davidson  06/20/2024  You are scheduled for a Cardiac Catheterization on Monday, January 5 with Dr. Deatrice Cage.  1. Please arrive at the [facility] Entrance of [facility], [address], [place] [address] at 10:00 AM (This is 1 hour(s) prior to your procedure time).  Proceed to the Check-In Desk directly inside the entrance.  Procedure Parking: Use the entrance off of the [address] side of the hospital. Turn right upon entering and follow the driveway to parking that is directly in front of the [facility]. There is no valet parking available at this entrance, however there is an awning directly in front of the [facility] Center for drop off/ pick up for patients.  Special note: Every effort is made to have your procedure done on time. Please understand that emergencies sometimes delay scheduled procedures.  2. Diet: Nothing to eat after midnight.   3. Hydration: You need to be well hydrated before your procedure. On January 5, you may drink approved liquids (see below) until 2 hours before the procedure, with 16 oz of water as your last intake.   List of approved liquids water, clear juice, clear tea, black coffee, fruit juices, non-citric and without pulp, carbonated beverages, Gatorade, Kool -Aid, plain Jello-O and plain ice popsicles.  4. Labs: Labs  will be completed on 06/25/24  5. Medication instructions in preparation for your procedure:   Contrast Allergy: No  Hold the morning of procedure  Lasix  Spironolactone    On the morning of your procedure, take your Aspirin  81 mg and any morning medicines NOT listed above.  You may use sips of water.  6. Plan to go home the same day, you will only stay overnight if medically necessary. 7. Bring a current list of your medications and current insurance cards. 8. You MUST have a responsible person to drive you home. 9. Someone MUST be with you the first 24 hours after you arrive home or your discharge will be delayed. 10. Please wear clothes that are easy to get on and off and wear slip-on shoes.  Thank you for allowing us  to care for you!   -- [facility]   Follow-Up: At [facility], you and your health needs are our priority.  As part of our continuing mission to provide you with exceptional heart care, our providers are all part of one team.  This team includes your primary Cardiologist (physician) and Advanced Practice Providers or APPs (Physician Assistants and Nurse Practitioners) who all work together to provide you with the care you need, when you need it.  Your next appointment:   2 week(s)  Provider:   Mercy Hails, PA

## 2024-06-23 ENCOUNTER — Encounter: Payer: Self-pay | Admitting: Cardiovascular Disease

## 2024-06-25 ENCOUNTER — Inpatient Hospital Stay

## 2024-06-25 ENCOUNTER — Inpatient Hospital Stay (HOSPITAL_BASED_OUTPATIENT_CLINIC_OR_DEPARTMENT_OTHER): Admitting: Hematology and Oncology

## 2024-06-25 VITALS — BP 134/73 | HR 88 | Temp 97.9°F | Resp 16 | Wt 151.9 lb

## 2024-06-25 DIAGNOSIS — R058 Other specified cough: Secondary | ICD-10-CM | POA: Diagnosis not present

## 2024-06-25 DIAGNOSIS — D708 Other neutropenia: Secondary | ICD-10-CM

## 2024-06-25 DIAGNOSIS — D801 Nonfamilial hypogammaglobulinemia: Secondary | ICD-10-CM

## 2024-06-25 DIAGNOSIS — C9001 Multiple myeloma in remission: Secondary | ICD-10-CM

## 2024-06-25 DIAGNOSIS — C9002 Multiple myeloma in relapse: Secondary | ICD-10-CM | POA: Diagnosis not present

## 2024-06-25 LAB — CMP (CANCER CENTER ONLY)
ALT: 16 U/L (ref 0–44)
AST: 29 U/L (ref 15–41)
Albumin: 4 g/dL (ref 3.5–5.0)
Alkaline Phosphatase: 73 U/L (ref 38–126)
Anion gap: 10 (ref 5–15)
BUN: 16 mg/dL (ref 8–23)
CO2: 25 mmol/L (ref 22–32)
Calcium: 9.3 mg/dL (ref 8.9–10.3)
Chloride: 106 mmol/L (ref 98–111)
Creatinine: 0.81 mg/dL (ref 0.44–1.00)
GFR, Estimated: >60 mL/min
Glucose, Bld: 96 mg/dL (ref 70–99)
Potassium: 3.4 mmol/L — ABNORMAL LOW (ref 3.5–5.1)
Sodium: 141 mmol/L (ref 135–145)
Total Bilirubin: 0.3 mg/dL (ref 0.0–1.2)
Total Protein: 6.1 g/dL — ABNORMAL LOW (ref 6.5–8.1)

## 2024-06-25 LAB — CBC WITH DIFFERENTIAL (CANCER CENTER ONLY)
Abs Immature Granulocytes: 0.01 K/uL (ref 0.00–0.07)
Basophils Absolute: 0 K/uL (ref 0.0–0.1)
Basophils Relative: 1 %
Eosinophils Absolute: 0 K/uL (ref 0.0–0.5)
Eosinophils Relative: 1 %
HCT: 32.1 % — ABNORMAL LOW (ref 36.0–46.0)
Hemoglobin: 10.5 g/dL — ABNORMAL LOW (ref 12.0–15.0)
Immature Granulocytes: 1 %
Lymphocytes Relative: 44 %
Lymphs Abs: 1 K/uL (ref 0.7–4.0)
MCH: 33.4 pg (ref 26.0–34.0)
MCHC: 32.7 g/dL (ref 30.0–36.0)
MCV: 102.2 fL — ABNORMAL HIGH (ref 80.0–100.0)
Monocytes Absolute: 0.3 K/uL (ref 0.1–1.0)
Monocytes Relative: 14 %
Neutro Abs: 0.8 K/uL — ABNORMAL LOW (ref 1.7–7.7)
Neutrophils Relative %: 39 %
Platelet Count: 112 K/uL — ABNORMAL LOW (ref 150–400)
RBC: 3.14 MIL/uL — ABNORMAL LOW (ref 3.87–5.11)
RDW: 20.2 % — ABNORMAL HIGH (ref 11.5–15.5)
WBC Count: 2.2 K/uL — ABNORMAL LOW (ref 4.0–10.5)
nRBC: 0 % (ref 0.0–0.2)

## 2024-06-25 NOTE — Progress Notes (Signed)
 " [facility] Telephone:(336) [phone]   Fax:([phone]  PROGRESS NOTE  Patient Care Team: Patient, [name] as PCP - General (General Practice) Jean Deatrice LABOR, MD as PCP - Cardiology (Cardiology) Jean Liborio Backers, MD as Referring Physician (Hematology and Oncology) Jean File, MD as Consulting Physician (Internal Medicine) Jean Davidson, Jean SAILOR, MD as Consulting Physician (Infectious Diseases) Jean Maranda Foots An, MD as Consulting Physician (Hematology and Oncology) Jean Lenon Irving, NP as Nurse Practitioner (Hematology and Oncology) Jean Norleen ONEIDA MADISON, MD as Consulting Physician (Hematology and Oncology) Duke, Jean Davidson, Jean Davidson as Physician Assistant (Cardiology)  Hematological/Oncological History # Relapsed/Refractory IgA Lambda Multiple Myeloma, In Remission 2016: Underwent autologous stem cell transplant 2017: Underwent allogenic stem cell transplant 10/16/2022: Completed CAR-T therapy with [facility] 09/2023: Started on Elrexfio  due to progression of disease 01/02/2024: Last visit at [facility] where induction therapy was completed 01/09/2024: establish care with Dr. Federico.   Interval History:  Jean Davidson 78 y.o. female with medical history significant for relapsed/refractory multiple myeloma who presents for a follow up visit. The patient's last visit was on 05/28/2024. In the interim since the last visit she has continued maintenance Elrexfio  therapy  [name] reports he is feeling considerably better.  Her cough has improved but is not entirely resolved.  She reports that she does have Davidson angioplasty coming up next week with likely stent placement.  She reports that she does have a urinary catheter in place and unfortunate her bladder lift has been on hold because of her cardiac procedure.  She reports that she was able to walk 1600 steps the other day which was quite good for her.  She reports her appetite is strong and she is  not having any trouble with fevers, chills, sweats, nausea, vomiting or diarrhea.  Because of her borderline cytopenias today and her upcoming procedure we discussed holding Elrexfio .  She voiced understanding of our recommendations and are desire to regroup in 2 weeks time to reassess and potentially restart.   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   Osteopenia    Spinal stenosis in cervical region    Varicose veins of lower extremities with inflammation     SURGICAL HISTORY: Past Surgical History:  Procedure Laterality Date   ANKLE SURGERY Left    BLADDER SUSPENSION  1988/1989   x2   COLONOSCOPY     ESOPHAGOGASTRODUODENOSCOPY     IR IMAGING GUIDED PORT INSERTION  03/01/2018   LIMBAL STEM CELL TRANSPLANT     x2   PORT-A-CATH REMOVAL     SHOULDER SURGERY Right 04/06/2013   right   UPPER GASTROINTESTINAL ENDOSCOPY      SOCIAL HISTORY: Social History   Socioeconomic History   Marital status: Divorced    Spouse name: Not on Davidson   Number of children: Not on Davidson  Years of education: Not on Davidson   Highest education level: Not on Davidson  Occupational History   Not on Davidson  Tobacco Use   Smoking status: Former    Current packs/day: 0.00    Average packs/day: 0.5 packs/day for 4.0 years (2.0 ttl pk-yrs)    Types: Cigarettes    Start date: 06/26/1964    Quit date: 06/26/1968    Years since quitting: 56.0   Smokeless tobacco: Never  Vaping Use   Vaping status:  Never Used  Substance and Sexual Activity   Alcohol  use: Yes    Alcohol /week: 1.0 standard drink of alcohol     Types: 1 Cans of beer per week    Comment: 1 per week per pt   Drug use: No   Sexual activity: Not on Davidson  Other Topics Concern   Not on Davidson  Social History Narrative   [name] is daughter shannara, winbush will,  full code ( reviewed 69)   Social Drivers of Health   Tobacco Use: Medium Risk (06/20/2024)   Patient History    Smoking Tobacco Use: Former    Smokeless Tobacco Use: Never    Passive Exposure: Not on Davidson  Financial Resource Strain: Low Risk  (10/09/2023)   Received from [facility]   Overall Financial Resource Strain (CARDIA)    Difficulty of Paying Living Expenses: Not hard at all  Food Insecurity: No Food Insecurity (03/09/2024)   Epic    Worried About Programme Researcher, Broadcasting/film/video in the Last Year: Never true    Ran Out of Food in the Last Year: Never true  Transportation Needs: No Transportation Needs (03/09/2024)   Epic    Lack of Transportation (Medical): No    Lack of Transportation (Non-Medical): No  Physical Activity: Not on Davidson  Stress: Not on Davidson  Social Connections: Moderately Integrated (03/09/2024)   Social Connection and Isolation Panel    Frequency of Communication with Friends and Family: More than three times a week    Frequency of Social Gatherings with Friends and Family: Twice a week    Attends Religious Services: 1 to 4 times per year    Active Member of [employer] or Organizations: Yes    Attends Banker Meetings: 1 to 4 times per year    Marital Status: Divorced  Intimate Partner Violence: Not At Risk (03/09/2024)   Epic    Fear of Current or Ex-Partner: No    Emotionally Abused: No    Physically Abused: No    Sexually Abused: No  Depression (PHQ2-9): Low Risk (06/04/2024)   Depression (PHQ2-9)    PHQ-2 Score: 0  Alcohol  Screen: Not on Davidson  Housing: Low Risk (03/09/2024)   Epic    Unable to Pay for  Housing in the Last Year: No    Number of Times Moved in the Last Year: 0    Homeless in the Last Year: No  Utilities: Not At Risk (03/09/2024)   Epic    Threatened with loss of utilities: No  Health Literacy: Not on Davidson    FAMILY HISTORY: Family History  Problem Relation Age of Onset   Heart disease Mother    Heart failure Father    Heart disease Father    Throat cancer Father    Skin cancer Father    Diabetes Father    Kidney disease Father    Hypertension Brother    Heart disease Brother        congenital shunt, ?  Colon cancer Paternal Aunt 27   Skin cancer Brother    Stomach cancer Neg Hx    Esophageal cancer Neg Hx    Rectal cancer Neg Hx     ALLERGIES:  has no known allergies.  MEDICATIONS:  Current Outpatient Medications  Medication Sig Dispense Refill   acetaminophen  (TYLENOL ) 500 MG tablet Take 500 mg by mouth every 6 (six) hours as needed for moderate pain (pain score 4-6) or mild pain (pain score 1-3).     acyclovir  (ZOVIRAX ) 400 MG tablet Take 1 tablet (400 mg total) by mouth 2 (two) times daily. 180 tablet 1   Artificial Tear Ointment (DRY EYES OP) Place 1 drop into both eyes daily as needed (dry eyes).     atovaquone  (MEPRON ) 750 MG/5ML suspension TAKE 10 MLS (1,500 MG TOTAL) BY MOUTH 2 (TWO) TIMES DAILY. 420 mL 3   calcium  carbonate (TUMS - DOSED IN MG ELEMENTAL CALCIUM ) 500 MG chewable tablet Chew 1 tablet by mouth daily as needed for indigestion or heartburn.     cholecalciferol  (VITAMIN D3) 25 MCG (1000 UNIT) tablet Take 1,000 Units by mouth daily. Instructed to increase to 4,000 units daily     docusate sodium (COLACE) 50 MG capsule Take 50 mg by mouth 2 (two) times daily.     metoprolol  succinate (TOPROL -XL) 25 MG 24 hr tablet Take 1.5 tablets (37.5 mg total) by mouth daily. 135 tablet 3   mirtazapine  (REMERON ) 15 MG tablet TAKE 1 TABLET BY MOUTH EVERYDAY AT BEDTIME 90 tablet 1   Multiple Vitamins-Minerals (PRESERVISION AREDS 2 PO) Take 1 tablet by  mouth 2 (two) times daily.     ondansetron  (ZOFRAN ) 8 MG tablet Take 1 tablet (8 mg total) by mouth every 8 (eight) hours as needed (Nausea or vomiting). 60 tablet 1   pantoprazole  (PROTONIX ) 40 MG tablet TAKE 1 TABLET BY MOUTH 2 TIMES DAILY. TAKE 30-60 MINUTES BEFORE BREAKFAST AND DINNER. 180 tablet 0   prochlorperazine  (COMPAZINE ) 5 MG tablet Take 5 mg by mouth every 6 (six) hours as needed for nausea or vomiting.     aspirin  EC 81 MG tablet Take 81 mg by mouth daily. Swallow whole. (Patient not taking: Reported on 06/25/2024)     furosemide  (LASIX ) 20 MG tablet Take 1 tablet (20 mg total) by mouth daily. (Patient not taking: Reported on 06/25/2024) 90 tablet 3   ivabradine  (CORLANOR) 5 MG TABS tablet Take 2 tablets 2 hours prior to cardiac ct (Patient not taking: No sig reported) 2 tablet 0   Magnesium  Citrate 200 MG TABS Take 200 mg by mouth daily. (Patient not taking: No sig reported) 90 tablet 3   potassium chloride  SA (KLOR-CON  M20) 20 MEQ tablet Take 1 tablet (20 mEq total) by mouth daily. (Patient not taking: No sig reported) 90 tablet 1   senna-docusate (SENOKOT-S) 8.6-50 MG tablet Take 1 tablet by mouth daily as needed for mild constipation or moderate constipation.     No current facility-administered medications for this visit.    REVIEW OF SYSTEMS:   Constitutional: ( - ) fevers, ( - )  chills , ( - ) night sweats Eyes: ( - ) blurriness of vision, ( - ) double vision, ( - ) watery eyes Ears, nose, mouth, throat, and face: ( - ) mucositis, ( - ) sore throat Respiratory: (+ ) cough, ( - ) dyspnea, ( - ) wheezes Cardiovascular: ( - ) palpitation, ( - ) chest discomfort, ( - ) lower extremity swelling Gastrointestinal:  ( +)  nausea, ( - ) heartburn, ( - ) change in bowel habits Skin: ( - ) abnormal skin rashes Lymphatics: ( - ) new lymphadenopathy, ( - ) easy bruising Neurological: ( - ) numbness, ( - ) tingling, ( - ) new weaknesses Behavioral/Psych: ( - ) mood change, ( - ) new  changes  All other systems were reviewed with the patient and are negative.  PHYSICAL EXAMINATION: ECOG PERFORMANCE STATUS: 1 - Symptomatic but completely ambulatory  Vitals:   06/25/24 0805  BP: 134/73  Pulse: 88  Resp: 16  Temp: 97.9 F (36.6 C)  SpO2: 100%     Filed Weights   06/25/24 0805  Weight: 151 lb 14.4 oz (68.9 kg)     PHYSICAL EXAM:  ECOG PERFORMANCE STATUS: 1 - Symptomatic but completely ambulatory   Vitals:   06/25/24 0805  BP: 134/73  Pulse: 88  Resp: 16  Temp: 97.9 F (36.6 C)  SpO2: 100%      GENERAL: Well-appearing elderly Caucasian female, alert, no distress and comfortable SKIN: skin color, texture, turgor are normal, no rashes or significant lesions EYES: conjunctiva are pink and non-injected, sclera clear LUNGS:normal breathing effort. Some wheezing noted in lower lobes bilaterally.  HEART: regular rate & rhythm and no murmurs and no lower extremity edema Musculoskeletal: no cyanosis of digits and no clubbing  PSYCH: alert & oriented x 3, fluent speech NEURO: no focal motor/sensory deficits   LABORATORY DATA:  I have reviewed the data as listed    Latest Ref Rng & Units 06/25/2024    7:49 AM 06/11/2024    8:50 AM 06/09/2024    7:02 AM  CBC  WBC 4.0 - 10.5 K/uL 2.2  3.5  3.3   Hemoglobin 12.0 - 15.0 g/dL 89.4  8.9  9.3   Hematocrit 36.0 - 46.0 % 32.1  26.2  27.4   Platelets 150 - 400 K/uL 112  170  162        Latest Ref Rng & Units 06/25/2024    7:49 AM 06/11/2024    8:50 AM 06/09/2024    7:02 AM  CMP  Glucose 70 - 99 mg/dL 96  875  884   BUN 8 - 23 mg/dL 16  15  13    Creatinine 0.44 - 1.00 mg/dL 9.18  8.89  9.07   Sodium 135 - 145 mmol/L 141  139  137   Potassium 3.5 - 5.1 mmol/L 3.4  3.5  4.0   Chloride 98 - 111 mmol/L 106  102  103   CO2 22 - 32 mmol/L 25  23  23    Calcium  8.9 - 10.3 mg/dL 9.3  9.3  9.0   Total Protein 6.5 - 8.1 g/dL 6.1  6.8  6.6   Total Bilirubin 0.0 - 1.2 mg/dL 0.3  0.6  0.6   Alkaline Phos 38 -  126 U/L 73  82  81   AST 15 - 41 U/L 29  26  34   ALT 0 - 44 U/L 16  6  5      Lab Results  Component Value Date   MPROTEIN Not Observed 06/11/2024   MPROTEIN Not Observed 05/14/2024   MPROTEIN Not Observed 03/26/2024   Lab Results  Component Value Date   KPAFRELGTCHN 0.8 (L) 06/11/2024   KPAFRELGTCHN <0.7 (L) 05/14/2024   KPAFRELGTCHN <0.7 (L) 03/26/2024   LAMBDASER <1.5 (L) 06/11/2024   LAMBDASER <1.5 (L) 05/14/2024   LAMBDASER <1.5 (L) 03/26/2024   KAPLAMBRATIO >0.53 06/11/2024  KAPLAMBRATIO UNABLE TO CALCULATE 05/14/2024   KAPLAMBRATIO UNABLE TO CALCULATE 03/26/2024     RADIOGRAPHIC STUDIES: CT CORONARY FFR DATA PREP & FLUID ANALYSIS Result Date: 06/12/2024 EXAM: CT FFR ANALYSIS CLINICAL DATA:  Heart Failure FINDINGS: FFRct analysis was performed on the original cardiac CT angiogram dataset. Diagrammatic representation of the FFRct analysis is provided in a separate PDF document in PACS. This dictation was created using the PDF document and Davidson interactive 3D model of the results. 3D model is not available in the EMR/PACS. Normal FFR range is >0.80. Indeterminate (grey) zone is 0.76-0.80. FFR delta of 0.13 is considered significant. 1. Left Main: FFR = 1.00 2. LAD: Proximal FFR = 0.95, Mid FFR = 0.63, Distal FFR = 0.58 3. LCX: Proximal FFR = 0.98, distal FFR = 0.65 4. RCA: Proximal FFR = 0.98, mid FFR =0.95, Distal FFR = 0.91 IMPRESSION: 1. CT FFR analysis showed significant stenosis at proximal to mid LAD. 2. CT FFR analysis illustrates flow-limiting disease in the distal LCX, this is likely secondary to slow flow degradation due to vessel tapering rather than obstructive disease. Clinical correlation required. RECOMMENDATIONS: Invasive angiography recommended to further evaluate the LAD distribution. Guideline-directed medical therapy and aggressive risk factor modification for secondary prevention of coronary artery disease. Electronically Signed   By: Madonna Large   On: 06/12/2024  13:20   CT CORONARY MORPH W/CTA COR W/SCORE W/CA W/CM &/OR WO/CM Result Date: 06/12/2024 HISTORY: systolic heart failure EXAM: Cardiac/Coronary  CT PROTOCOL: A non-contrast, gated CT scan was obtained with axial slices of 2.5 mm through the heart for calcium  scoring. Calcium  scoring was performed using the Agatston method. A 120 kV prospective, gated, contrast cardiac CT scan was obtained. Gantry rotation speed was 230 msec and collimation was 0.63 mm. Two sublingual nitroglycerin  tablets (0.8 mg) were given. The 3D data set was reconstructed with motion correction for the best systolic or diastolic phase. Images were analyzed on a dedicated workstation using MPR, MIP, and VRT modes. The patient received 95 cc of contrast. FINDINGS: Image quality: Average. Artifact: Limited. Coronary calcium  score is 1190, which places the patient in the 94th percentile for age and sex matched control. Coronary arteries: Normal coronary origins.  Right dominance. Left Main Coronary Artery: Normal caliber vessel, originates from the left coronary cusp, bifurcates to form a left anterior descending artery (LAD) and a left circumflex artery (LCX). Mild stenosis (25-49%) within the left main due to calcified plaque. Left Anterior Descending Artery: Moderate stenosis (50-69%) due to mixed plaque at the ostial/proximal LAD. Severe stenosis (70-99%) due to mixed plaque at the proximal/mid LAD. Mid to apical LAD patent with luminal irregularities due to mixed plaque. First Diagonal branch: Patent Left Circumflex Artery: Mild stenosis (25-49%) calcified plaque at the ostial LCX. Mild stenosis (25-49%) soft plaque at the proximal/mid LCX. Mid to distal LCX patent. First Obtuse Marginal branch: Normal caliber, large, bifurcates into superior inferior branches, patent Right Coronary Artery: Mild stenosis (25-49%) mixed plaque at the mid RCA. Otherwise patent. Aorta: Normal size, 38 mm at the mid ascending aorta (level of the PA bifurcation)  measured double oblique. Aortic atherosclerosis. Aortic Valve: Native valve, trileaflet aortic valve, no significant calcification. Mitral valve: Native valve, moderate to severe mitral annular calcification. Other findings: Normal pulmonary vein drainage into the left atrium. Normal left atrial appendage without thrombus. Normal size of the pulmonary artery. Please see separate report from [facility] for non-cardiac findings. IMPRESSION: 1. Coronary calcium  score of 1190. This was 94th percentile for  age and sex matched control. 2. Normal coronary origins with right dominance. 3. CAD-RADS 4 Severe coronary disease. 4. CT FFR will be performed and reported separately further evaluate left main/LAD distribution. 5. Aortic atherosclerosis. 6.  Mitral annular calcification, correlate with echocardiography RECOMMENDATION: CT FFR results pending. Consider symptom-guided anti-ischemic pharmacotherapy as well as risk factor modification per guideline directed care. Electronically Signed   By: Madonna Large   On: 06/12/2024 13:09   DG Chest 2 View Result Date: 06/09/2024 CLINICAL DATA:  Shortness of breath. EXAM: CHEST - 2 VIEW COMPARISON:  Chest radiograph dated 05/28/2024. FINDINGS: No focal consolidation, pleural effusion, pneumothorax. The cardiac silhouette is within limits. No acute osseous pathology. Right shoulder arthroplasty. IMPRESSION: No active cardiopulmonary disease. Electronically Signed   By: Vanetta Chou M.D.   On: 06/09/2024 09:34   DG Chest 2 View Result Date: 05/29/2024 CLINICAL DATA:  Persistent cough EXAM: DG CHEST 2V COMPARISON:  Chest radiograph dated 03/08/2024, CTA chest dated 03/09/2024 FINDINGS: Normal lung volumes. Right basilar patchy opacity. No pleural effusion or pneumothorax. The heart size and mediastinal contours are within normal limits. No acute osseous abnormality. Right shoulder arthroplasty hardware appears intact. IMPRESSION: Right basilar patchy opacity, which  may represent atelectasis, aspiration, or pneumonia. Electronically Signed   By: Limin  Xu M.D.   On: 05/29/2024 15:46    ASSESSMENT & PLAN Jean Davidson is a 78 y.o.  female with medical history significant for relapsed/refractory multiple myeloma who presents for a follow up visit.   # Relapsed/Refractory IgA Lambda Multiple Myeloma, In Remission -- Patient is undergone numerous prior therapies including autosomal stem cell transplant, allogenic stem cell transplant, CAR-T therapy, and now bispecific therapy --Patient completed her induction of bispecific therapy at [name] -- Patient presents to continue maintenance therapy. PLAN: --Labs today show white blood cell 2.2, Hgb 10.5, MCV 102.2, Plt 112. LFTs and creatinine within normal limits. --Most recent myeloma panel from 05/14/2024 showed M protein is not detectable.  --HOLD cycle 9-day 15 of Elrexfio  today due to borderline cytopenias and upcoming cardiac interventions. --Plan for IVIG to be administered if IgG is less than 400. Last 803 on 06/11/2024.  -- Will plan for q 2 weekly maintenance Elrexfio  with clinic visits every treatment.   #Cough: --obtained chest xray to further evaluate for infectious process --continue with mucinex  and recommend to try OTC antihistamine. Will send prescription for Tessalon  capsules.  --advised to follow up if she develops a persistent fever and/or worsening cough.   #Supportive Care -- zofran  8mg  q8H PRN and compazine  10mg  PO q6H for nausea -- acyclovir  400mg  PO BID for VCZ prophylaxis --Continue atovaquone  1500 mg twice daily -- IVIG to be administered when IgG are less than 400 -- EMLA  cream for port -- no pain medication required at this time.   No orders of the defined types were placed in this encounter.   All questions were answered. The patient knows to call the clinic with any problems, questions or concerns.   I have spent a total of 30 minutes minutes of  face-to-face and non-face-to-face time, preparing to see the patient, performing a medically appropriate examination, counseling and educating the patient, documenting clinical information in the electronic health record, independently interpreting results and communicating results to the patient, and care coordination.   Norleen IVAR Kidney, MD Department of Hematology/Oncology [facility] Phone: [phone] Pager: [phone] Email: [email or web address]    06/25/2024 10:20 AM  "

## 2024-06-30 ENCOUNTER — Ambulatory Visit
Admission: RE | Admit: 2024-06-30 | Discharge: 2024-07-01 | Disposition: A | Discharge status: Home / Self Care | Attending: Cardiovascular Disease | Admitting: Cardiovascular Disease

## 2024-06-30 ENCOUNTER — Encounter: Payer: Self-pay | Admitting: Cardiovascular Disease

## 2024-06-30 ENCOUNTER — Encounter
Admission: RE | Disposition: A | Discharge status: Home / Self Care | Payer: Self-pay | Source: Home / Self Care | Attending: Cardiovascular Disease

## 2024-06-30 DIAGNOSIS — I2510 Atherosclerotic heart disease of native coronary artery without angina pectoris: Secondary | ICD-10-CM

## 2024-06-30 DIAGNOSIS — E785 Hyperlipidemia, unspecified: Secondary | ICD-10-CM | POA: Insufficient documentation

## 2024-06-30 DIAGNOSIS — I110 Hypertensive heart disease with heart failure: Secondary | ICD-10-CM | POA: Insufficient documentation

## 2024-06-30 DIAGNOSIS — I5042 Chronic combined systolic (congestive) and diastolic (congestive) heart failure: Secondary | ICD-10-CM | POA: Insufficient documentation

## 2024-06-30 DIAGNOSIS — C946 Myelodysplastic disease, not classified: Secondary | ICD-10-CM | POA: Diagnosis present

## 2024-06-30 DIAGNOSIS — C9000 Multiple myeloma not having achieved remission: Secondary | ICD-10-CM | POA: Diagnosis not present

## 2024-06-30 DIAGNOSIS — K219 Gastro-esophageal reflux disease without esophagitis: Secondary | ICD-10-CM | POA: Diagnosis not present

## 2024-06-30 DIAGNOSIS — I10 Essential (primary) hypertension: Secondary | ICD-10-CM | POA: Diagnosis present

## 2024-06-30 DIAGNOSIS — R0609 Other forms of dyspnea: Secondary | ICD-10-CM

## 2024-06-30 DIAGNOSIS — I700 Atherosclerosis of aorta: Secondary | ICD-10-CM | POA: Diagnosis not present

## 2024-06-30 DIAGNOSIS — I471 Supraventricular tachycardia, unspecified: Secondary | ICD-10-CM | POA: Insufficient documentation

## 2024-06-30 DIAGNOSIS — I5020 Unspecified systolic (congestive) heart failure: Secondary | ICD-10-CM | POA: Insufficient documentation

## 2024-06-30 DIAGNOSIS — I25118 Atherosclerotic heart disease of native coronary artery with other forms of angina pectoris: Secondary | ICD-10-CM | POA: Insufficient documentation

## 2024-06-30 DIAGNOSIS — I2089 Other forms of angina pectoris: Secondary | ICD-10-CM | POA: Diagnosis present

## 2024-06-30 DIAGNOSIS — Z955 Presence of coronary angioplasty implant and graft: Secondary | ICD-10-CM | POA: Diagnosis not present

## 2024-06-30 DIAGNOSIS — I2584 Coronary atherosclerosis due to calcified coronary lesion: Secondary | ICD-10-CM | POA: Diagnosis not present

## 2024-06-30 DIAGNOSIS — Z79899 Other long term (current) drug therapy: Secondary | ICD-10-CM | POA: Diagnosis not present

## 2024-06-30 HISTORY — PX: CORONARY STENT INTERVENTION: CATH118234

## 2024-06-30 HISTORY — PX: LEFT HEART CATH AND CORONARY ANGIOGRAPHY: CATH118249

## 2024-06-30 LAB — POCT ACTIVATED CLOTTING TIME
Activated Clotting Time: 363 s
Activated Clotting Time: 373 s

## 2024-06-30 MED ORDER — MIDAZOLAM HCL (PF) 2 MG/2ML IJ SOLN
INTRAMUSCULAR | Status: DC | PRN
  Administered 2024-06-30: 1 mg via INTRAVENOUS

## 2024-06-30 MED ORDER — FENTANYL CITRATE (PF) 100 MCG/2ML IJ SOLN
INTRAMUSCULAR | Status: AC
  Filled 2024-06-30: qty 2

## 2024-06-30 MED ORDER — VERAPAMIL HCL 2.5 MG/ML IV SOLN
INTRAVENOUS | Status: AC
  Filled 2024-06-30: qty 2

## 2024-06-30 MED ORDER — NITROGLYCERIN 1 MG/10 ML FOR IR/CATH LAB
INTRA_ARTERIAL | Status: AC
  Filled 2024-06-30: qty 10

## 2024-06-30 MED ORDER — ASPIRIN 81 MG PO CHEW
CHEWABLE_TABLET | ORAL | Status: AC
  Administered 2024-06-30: 81 mg via ORAL
  Filled 2024-06-30: qty 1

## 2024-06-30 MED ORDER — SENNOSIDES-DOCUSATE SODIUM 8.6-50 MG PO TABS
1.0000 | ORAL_TABLET | Freq: Every day | ORAL | Status: DC | PRN
  Filled 2024-06-30: qty 1

## 2024-06-30 MED ORDER — CLOPIDOGREL BISULFATE 75 MG PO TABS
ORAL_TABLET | ORAL | Status: DC | PRN
  Administered 2024-06-30: 600 mg via ORAL

## 2024-06-30 MED ORDER — HEPARIN SODIUM (PORCINE) 1000 UNIT/ML IJ SOLN
INTRAMUSCULAR | Status: AC
  Filled 2024-06-30: qty 10

## 2024-06-30 MED ORDER — FREE WATER: 500.0000 mL | Freq: Once | Status: DC

## 2024-06-30 MED ORDER — SODIUM CHLORIDE 0.9% FLUSH
3.0000 mL | Freq: Two times a day (BID) | INTRAVENOUS | Status: DC
  Administered 2024-06-30 – 2024-07-01 (×2): 3 mL via INTRAVENOUS

## 2024-06-30 MED ORDER — DOCUSATE SODIUM 100 MG PO CAPS
100.0000 mg | ORAL_CAPSULE | Freq: Two times a day (BID) | ORAL | Status: DC
  Administered 2024-06-30: 100 mg via ORAL
  Filled 2024-06-30 (×2): qty 1

## 2024-06-30 MED ORDER — NITROGLYCERIN 1 MG/10 ML FOR IR/CATH LAB
INTRA_ARTERIAL | Status: DC | PRN
  Administered 2024-06-30: 200 ug via INTRACORONARY

## 2024-06-30 MED ORDER — ATORVASTATIN CALCIUM 20 MG PO TABS
40.0000 mg | ORAL_TABLET | Freq: Every day | ORAL | Status: DC
  Administered 2024-06-30: 40 mg via ORAL
  Filled 2024-06-30: qty 2

## 2024-06-30 MED ORDER — SODIUM CHLORIDE 0.9% FLUSH: 3.0000 mL | INTRAVENOUS | Status: DC | PRN

## 2024-06-30 MED ORDER — PANTOPRAZOLE SODIUM 40 MG PO TBEC
40.0000 mg | DELAYED_RELEASE_TABLET | Freq: Every day | ORAL | Status: DC
  Administered 2024-07-01: 40 mg via ORAL
  Filled 2024-06-30: qty 1

## 2024-06-30 MED ORDER — ATOVAQUONE 750 MG/5ML PO SUSP
1500.0000 mg | Freq: Two times a day (BID) | ORAL | Status: DC
  Administered 2024-06-30 – 2024-07-01 (×2): 1500 mg via ORAL
  Filled 2024-06-30 (×2): qty 10

## 2024-06-30 MED ORDER — ONDANSETRON HCL 4 MG/2ML IJ SOLN: 4.0000 mg | Freq: Four times a day (QID) | INTRAMUSCULAR | Status: DC | PRN

## 2024-06-30 MED ORDER — LIDOCAINE HCL 1 % IJ SOLN
INTRAMUSCULAR | Status: AC
  Filled 2024-06-30: qty 20

## 2024-06-30 MED ORDER — CALCIUM CARBONATE ANTACID 500 MG PO CHEW: 1.0000 | CHEWABLE_TABLET | Freq: Every day | ORAL | Status: DC | PRN

## 2024-06-30 MED ORDER — HEPARIN (PORCINE) IN NACL 1000-0.9 UT/500ML-% IV SOLN
INTRAVENOUS | Status: DC | PRN
  Administered 2024-06-30: 1000 mL

## 2024-06-30 MED ORDER — ASPIRIN 81 MG PO TBEC
81.0000 mg | DELAYED_RELEASE_TABLET | Freq: Every day | ORAL | Status: DC
  Administered 2024-07-01: 81 mg via ORAL
  Filled 2024-06-30: qty 1

## 2024-06-30 MED ORDER — ACETAMINOPHEN 325 MG PO TABS: 650.0000 mg | ORAL_TABLET | ORAL | Status: DC | PRN

## 2024-06-30 MED ORDER — SODIUM CHLORIDE 0.9 % IV SOLN: 250.0000 mL | INTRAVENOUS | Status: DC | PRN

## 2024-06-30 MED ORDER — IOHEXOL 300 MG/ML  SOLN
INTRAMUSCULAR | Status: DC | PRN
  Administered 2024-06-30: 150 mL

## 2024-06-30 MED ORDER — FENTANYL CITRATE (PF) 100 MCG/2ML IJ SOLN
INTRAMUSCULAR | Status: DC | PRN
  Administered 2024-06-30: 25 ug via INTRAVENOUS

## 2024-06-30 MED ORDER — HEPARIN SODIUM (PORCINE) 1000 UNIT/ML IJ SOLN
INTRAMUSCULAR | Status: DC | PRN
  Administered 2024-06-30: 4000 [IU] via INTRAVENOUS
  Administered 2024-06-30: 3000 [IU] via INTRAVENOUS

## 2024-06-30 MED ORDER — VERAPAMIL HCL 2.5 MG/ML IV SOLN
INTRAVENOUS | Status: DC | PRN
  Administered 2024-06-30: 2.5 mg via INTRA_ARTERIAL

## 2024-06-30 MED ORDER — METOPROLOL SUCCINATE ER 25 MG PO TB24
37.5000 mg | ORAL_TABLET | Freq: Every day | ORAL | Status: DC
  Administered 2024-07-01: 37.5 mg via ORAL
  Filled 2024-06-30: qty 2

## 2024-06-30 MED ORDER — MIDAZOLAM HCL 2 MG/2ML IJ SOLN
INTRAMUSCULAR | Status: AC
  Filled 2024-06-30: qty 2

## 2024-06-30 MED ORDER — CLOPIDOGREL BISULFATE 75 MG PO TABS
75.0000 mg | ORAL_TABLET | Freq: Every day | ORAL | Status: DC
  Administered 2024-07-01: 75 mg via ORAL
  Filled 2024-06-30: qty 1

## 2024-06-30 MED ORDER — ONDANSETRON HCL 4 MG PO TABS: 8.0000 mg | ORAL_TABLET | Freq: Three times a day (TID) | ORAL | Status: DC | PRN

## 2024-06-30 MED ORDER — PROCHLORPERAZINE MALEATE 5 MG PO TABS
5.0000 mg | ORAL_TABLET | Freq: Four times a day (QID) | ORAL | Status: DC | PRN
  Filled 2024-06-30: qty 1

## 2024-06-30 MED ORDER — VITAMIN D 25 MCG (1000 UNIT) PO TABS
1000.0000 [IU] | ORAL_TABLET | Freq: Every day | ORAL | Status: DC
  Administered 2024-07-01: 1000 [IU] via ORAL
  Filled 2024-06-30: qty 1

## 2024-06-30 MED ORDER — POTASSIUM CHLORIDE CRYS ER 20 MEQ PO TBCR
20.0000 meq | EXTENDED_RELEASE_TABLET | Freq: Every day | ORAL | Status: DC
  Administered 2024-07-01: 20 meq via ORAL
  Filled 2024-06-30: qty 1

## 2024-06-30 MED ORDER — MIRTAZAPINE 15 MG PO TABS
15.0000 mg | ORAL_TABLET | Freq: Every day | ORAL | Status: DC
  Administered 2024-06-30: 15 mg via ORAL
  Filled 2024-06-30: qty 1

## 2024-06-30 MED ORDER — ACYCLOVIR 200 MG PO CAPS
400.0000 mg | ORAL_CAPSULE | Freq: Two times a day (BID) | ORAL | Status: DC
  Administered 2024-06-30 – 2024-07-01 (×2): 400 mg via ORAL
  Filled 2024-06-30 (×2): qty 2

## 2024-06-30 MED ORDER — CLOPIDOGREL BISULFATE 75 MG PO TABS
ORAL_TABLET | ORAL | Status: AC
  Filled 2024-06-30: qty 8

## 2024-06-30 MED ORDER — ASPIRIN 81 MG PO CHEW: 81.0000 mg | CHEWABLE_TABLET | ORAL | Status: AC

## 2024-06-30 MED ORDER — SODIUM CHLORIDE 0.9 % IV SOLN
250.0000 mL | INTRAVENOUS | Status: DC | PRN
  Administered 2024-06-30: 250 mL via INTRAVENOUS

## 2024-06-30 MED ORDER — LIDOCAINE HCL (PF) 1 % IJ SOLN
INTRAMUSCULAR | Status: DC | PRN
  Administered 2024-06-30: 5 mL

## 2024-06-30 MED ORDER — MAGNESIUM CITRATE 200 MG PO TABS: 200.0000 mg | ORAL_TABLET | Freq: Every day | ORAL | Status: DC

## 2024-06-30 MED ORDER — SODIUM CHLORIDE 0.9% FLUSH: 3.0000 mL | Freq: Two times a day (BID) | INTRAVENOUS | Status: DC

## 2024-06-30 NOTE — Interval H&P Note (Signed)
 History and Physical Interval Note:  06/30/2024 12:22 PM  Jean Davidson  has presented today for surgery, with the diagnosis of L Cath   Dyspnea on exertion   Abnormal function test.  The various methods of treatment have been discussed with the patient and family. After consideration of risks, benefits and other options for treatment, the patient has consented to  Procedures: LEFT HEART CATH AND CORONARY ANGIOGRAPHY (Left) as a surgical intervention.  The patient's history has been reviewed, patient examined, no change in status, stable for surgery.  I have reviewed the patient's chart and labs.  Questions were answered to the patient's satisfaction.     [name]

## 2024-07-01 ENCOUNTER — Encounter: Payer: Self-pay | Admitting: Hematology and Oncology

## 2024-07-01 ENCOUNTER — Encounter: Payer: Self-pay | Admitting: Cardiovascular Disease

## 2024-07-01 ENCOUNTER — Other Ambulatory Visit: Payer: Self-pay

## 2024-07-01 DIAGNOSIS — I5020 Unspecified systolic (congestive) heart failure: Secondary | ICD-10-CM | POA: Insufficient documentation

## 2024-07-01 DIAGNOSIS — I4729 Other ventricular tachycardia: Secondary | ICD-10-CM

## 2024-07-01 DIAGNOSIS — E785 Hyperlipidemia, unspecified: Secondary | ICD-10-CM | POA: Insufficient documentation

## 2024-07-01 DIAGNOSIS — I5030 Unspecified diastolic (congestive) heart failure: Secondary | ICD-10-CM

## 2024-07-01 DIAGNOSIS — I10 Essential (primary) hypertension: Secondary | ICD-10-CM | POA: Diagnosis not present

## 2024-07-01 DIAGNOSIS — I25118 Atherosclerotic heart disease of native coronary artery with other forms of angina pectoris: Secondary | ICD-10-CM

## 2024-07-01 DIAGNOSIS — I2510 Atherosclerotic heart disease of native coronary artery without angina pectoris: Secondary | ICD-10-CM | POA: Insufficient documentation

## 2024-07-01 LAB — CBC
HCT: 29.4 % — ABNORMAL LOW (ref 36.0–46.0)
Hemoglobin: 9.4 g/dL — ABNORMAL LOW (ref 12.0–15.0)
MCH: 33.3 pg (ref 26.0–34.0)
MCHC: 32 g/dL (ref 30.0–36.0)
MCV: 104.3 fL — ABNORMAL HIGH (ref 80.0–100.0)
Platelets: 107 K/uL — ABNORMAL LOW (ref 150–400)
RBC: 2.82 MIL/uL — ABNORMAL LOW (ref 3.87–5.11)
RDW: 18.9 % — ABNORMAL HIGH (ref 11.5–15.5)
WBC: 2.7 K/uL — ABNORMAL LOW (ref 4.0–10.5)
nRBC: 0 % (ref 0.0–0.2)

## 2024-07-01 LAB — BASIC METABOLIC PANEL WITH GFR
Anion gap: 11 (ref 5–15)
BUN: 17 mg/dL (ref 8–23)
CO2: 23 mmol/L (ref 22–32)
Calcium: 8.7 mg/dL — ABNORMAL LOW (ref 8.9–10.3)
Chloride: 104 mmol/L (ref 98–111)
Creatinine, Ser: 0.78 mg/dL (ref 0.44–1.00)
GFR, Estimated: >60 mL/min
Glucose, Bld: 92 mg/dL (ref 70–99)
Potassium: 3.8 mmol/L (ref 3.5–5.1)
Sodium: 138 mmol/L (ref 135–145)

## 2024-07-01 MED ORDER — CLOPIDOGREL BISULFATE 75 MG PO TABS
75.0000 mg | ORAL_TABLET | Freq: Every day | ORAL | 5 refills | Status: DC
  Filled 2024-07-01: qty 30, 30d supply, fill #0

## 2024-07-01 MED ORDER — ATORVASTATIN CALCIUM 40 MG PO TABS
40.0000 mg | ORAL_TABLET | Freq: Every day | ORAL | 5 refills | Status: DC
  Filled 2024-07-01: qty 30, 30d supply, fill #0

## 2024-07-01 NOTE — Discharge Summary (Signed)
 "  Discharge Summary    Patient ID: Jean Davidson  MRN: [id number], DOB/AGE: April 20, 1946 79 y.o.  Admit Date: 06/30/2024 Discharge Date: 07/01/2024  Primary Care Provider: Patient, [name] Primary Cardiologist: Dr. 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  She was initially referred to cardiology in 2016 for palpitations with a Holter monitor showing NSR with infrequent PVCs/PACs.  POET at that time was normal.  Repeat heart monitor in 2020 for increasing palpitations showed multiple runs of SVT.  She was transition from carvedilol  to metoprolol  and did well.    Echocardiogram in 2017 with large pericardial effusion managed by [facility] cardiology, effusion resolved without intervention.    She was admitted in 02/2024 with severe sepsis and acute hypoxic respiratory failure secondary to pneumonia.  Echocardiogram demonstrated LVEF 35-40% with no RWMA, grade 1 DD, normal RV, moderate MR.  She was diuresed and discharged on Lasix  80 mg daily, 25 mg Toprol .  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  Due to her strong family history of CAD, decision was made to defer ischemic evaluation in the setting of severe illness and revisit possibly CT coronary once recovered.  Clinical course was subsequently complicated by recurrent UTIs in the setting  of chronic indwelling Foley catheter.  Repeat echo in 04/2024 showed normalization of LV systolic function with an EF of 60 to 65%, no regional wall motion normalities, normal RV systolic function and ventricular cavity size, moderate mitral annular calcification, and normal CVP.  Coronary CTA on 06/12/2024 showed a calcium  score of 1190 which was the 94th percentile.  There was 25 to 49% left main stenosis, 50 to 69% ostial to proximal LAD stenosis, 70 to 99% proximal to mid LAD stenosis, 25 to 49% ostial LCx stenosis, 25 to 49% proximal to mid LCx stenosis, and 25 to 49% mid RCA stenosis.  CT FFR showed significant stenosis at the proximal to mid LAD as well as flow-limiting disease in the LCx.  In this setting cardiac cath was recommended.  Hospital Course     Consultants: Cardiac rehab   She underwent LHC on 06/30/2024 that demonstrated severe two-vessel CAD involving mid LAD and mid LCx with both vessels estimated at 99% stenosis.  The coronary arteries were moderately calcified.  She underwent successful PCI/DES to the mid LAD and mid LCx.  Post cath vitals and labs stable.  Postprocedure, developed right radial arteriotomy bruising with small hematoma, neurovascularly intact without active bleeding.    CAD involving the native coronary arteries:  - Status post PCI/DES to the mid LAD and mid LCx  - DAPT with aspirin  81 mg clopidogrel  75 mg daily for minimum of 6 months without interruption  - Aggressive risk factor modification and secondary prevention including addition of atorvastatin  40 mg this admission  - Will need follow-up fasting lipid panel in 8 weeks with recommendation to escalate lipid-lowering therapy as indicated to achieve target LDL - LP(a) pending  -  Post-cath instructions  - Cardiac rehab   HFimpEF:  - Euvolemic and well compensated - Not requiring a standing loop diuretic - Remains on Toprol -XL 37.5 mg daily - Given normalization of LV systolic function, and lack of  heart failure symptoms, defer further escalation of GDMT at this time  PSVT:  - Quiescent  - Toprol -XL 37.5 mg daily   HTN:  - Blood pressure well-controlled - Remains on Toprol -XL 37.5 mg daily  Multiple myeloma:  - CBC stable, will need close monitoring on DAPT  - From a cardiac perspective, no contraindication to continued immunotherapy. - Followed by oncology   Hypokalemia:  - Improved - Remains on low-dose KCl 20 mEq daily  The patient's right radial arteriotomy has been examined with noted bruise and possible small hematoma proximally without active bleeding. The patient has been seen by Dr. Mady and felt to be stable for discharge today. All follow up appointments have been made. Discharge medications are listed below. Prescriptions have been reviewed with the patient and sent in to community pharmacy for meds to beds prior to discharge.  _____________  Discharge Vitals Blood pressure 112/63, pulse 80, temperature 98.4 F (36.9 C), resp. rate 18, height 5' 2 (1.575 m), weight 67.6 kg, SpO2 100%.  Filed Weights   06/30/24 1005  Weight: 67.6 kg   Physical Exam   GEN: No acute distress.   Neck: No JVD. Cardiac: RRR, no murmurs, rubs, or gallops.  Right radial arteriotomy site with soft bruising and small hematoma proximal not active bleeding.  Radial pulse 2+. Respiratory: Clear to auscultation bilaterally.  GI: Soft, nontender, non-distended.   MS: No edema; No deformity. Neuro:  Alert and oriented x 3; Nonfocal.  Psych: Normal affect.  Labs & Radiologic Studies    CBC Recent Labs    07/01/24 0417  WBC 2.7*  HGB 9.4*  HCT 29.4*  MCV 104.3*  PLT 107*   Basic Metabolic Panel Recent Labs    98/93/73 0417  NA 138  K 3.8  CL 104  CO2 23  GLUCOSE 92  BUN 17  CREATININE 0.78  CALCIUM  8.7*   Liver Function Tests No results for input(s): AST, ALT, ALKPHOS, BILITOT, PROT, ALBUMIN in the last 72 hours. No results for input(s): LIPASE,  AMYLASE in the last 72 hours. Cardiac Enzymes No results for input(s): CKTOTAL, CKMB, CKMBINDEX, TROPONINI in the last 72 hours. BNP Invalid input(s): POCBNP D-Dimer No results for input(s): DDIMER in the last 72 hours. Hemoglobin A1C No results for input(s): HGBA1C in the last 72 hours. Fasting Lipid Panel No results for input(s): CHOL, HDL, LDLCALC, TRIG, CHOLHDL, LDLDIRECT in the last 72 hours. Thyroid  Function Tests No results for input(s): TSH, T4TOTAL, T3FREE, THYROIDAB in the last 72 hours.  Invalid input(s): FREET3 _____________   Diagnostic Studies/Procedures   LHC 06/30/2024:   Mid LAD lesion is 99% stenosed.   Ost LAD to Prox LAD lesion is 20% stenosed.   Prox Cx to Mid Cx lesion is 99% stenosed.   1st Mrg lesion is 30% stenosed.   Mid RCA lesion is 30% stenosed.   A drug-eluting stent was successfully placed using a STENT ONYX FRONTIER 2.5X30.   A drug-eluting stent was successfully placed using a STENT ONYX FRONTIER 2.5X15.   Post intervention, there is a 0% residual stenosis.   Post intervention, there is a 0% residual stenosis.   In the absence of any other complications or medical issues, we expect the patient to be ready for discharge from  an interventional cardiology perspective on 07/01/2024.   Recommend uninterrupted dual antiplatelet therapy with Aspirin  81mg  daily and Clopidogrel  75mg  daily for a minimum of 6 months (stable ischemic heart disease-Class I recommendation).   1.  Severe two-vessel coronary artery disease involving mid LAD and mid left circumflex.  The coronary arteries are moderately calcified. 2.  Left ventricular angiography was not performed.  EF was normal by echo.  Normal left ventricular end-diastolic pressure. 3.  Successful angioplasty and drug-eluting stent placement to the mid LAD and mid left circumflex.   Recommendations: Observe overnight. Will add atorvastatin .    Diagnostic Dominance:  Right Left Main  Vessel is angiographically normal.    Left Anterior Descending  Ost LAD to Prox LAD lesion is 20% stenosed.  Mid LAD lesion is 99% stenosed. The lesion is type C. The lesion is mildly calcified.    First Diagonal Branch  Vessel is small in size.    Second Diagonal Branch  Vessel is angiographically normal.    Left Circumflex  Prox Cx to Mid Cx lesion is 99% stenosed. The lesion is not complex (non high-C).    First Obtuse Marginal Branch  1st Mrg lesion is 30% stenosed.    Right Coronary Artery  Mid RCA lesion is 30% stenosed.    Intervention   Mid LAD lesion  Stent  Lesion length: 25 mm. CATH LAUNCHER 6FR EBU3.5 guide catheter was inserted. Lesion crossed with guidewire using a WIRE RUNTHROUGH IZANAI 014 180. Pre-stent angioplasty was performed using a BALLOON TREK RX 2.5X12. Maximum pressure: 12 atm. Inflation time: 20 sec. A drug-eluting stent was successfully placed using a STENT ONYX FRONTIER 2.5X30. Maximum pressure: 12 atm. Inflation time: 20 sec. Post-stent angioplasty was performed using a BALLOON Noble TREK NEO RX [id number]. Maximum pressure: 14 atm. Inflation time: 20 sec.  Post-Intervention Lesion Assessment  The intervention was successful. Pre-interventional TIMI flow is 3. Post-intervention TIMI flow is 3. No complications occurred at this lesion.  There is a 0% residual stenosis post intervention.    Prox Cx to Mid Cx lesion  Stent  Lesion length: 12 mm. CATH LAUNCHER 6FR EBU3.5 guide catheter was inserted. Lesion crossed with guidewire using a WIRE RUNTHROUGH IZANAI 014 180. Pre-stent angioplasty was performed using a BALLOON MINITREK RX 2.0X12. Maximum pressure: 10 atm. Inflation time: 20 sec. A drug-eluting stent was successfully placed using a STENT ONYX FRONTIER 2.5X15. Maximum pressure: 16 atm. Inflation time: 20 sec. Post-stent angioplasty was not performed.  Post-Intervention Lesion Assessment  The intervention was successful. Pre-interventional  TIMI flow is 3. Post-intervention TIMI flow is 3. No complications occurred at this lesion.  There is a 0% residual stenosis post intervention.     Coronary Diagrams  Diagnostic Dominance: Right  Intervention     _____________  Disposition   Pt is being discharged home today in good condition.  Follow-up Plans & Appointments     Follow-up Information     Franchester, Cadence H, PA-C Follow up on 07/14/2024.   Specialty: Cardiology Why: Appointment time 10:05 AM Contact information: [address] [place] [address] [phone]                Discharge Instructions     AMB Referral to Cardiac Rehabilitation - Phase II   Complete by: As directed    Diagnosis:  Coronary Stents Stable Angina     After initial evaluation and assessments completed: Virtual Based Care may be provided alone or in conjunction with Phase 2  Cardiac Rehab based on patient barriers.: Yes   Intensive Cardiac Rehabilitation (ICR) [facility] location only OR Traditional Cardiac Rehabilitation (TCR) *If criteria for ICR are not met will enroll in TCR [facility] only): Yes   Increase activity slowly   Complete by: As directed    Remove dressing in 48 hours   Complete by: As directed        Discharge Medications   Allergies as of 07/01/2024   No Known Allergies      Medication List     STOP taking these medications    ivabradine  5 MG Tabs tablet Commonly known as: Corlanor       TAKE these medications    acetaminophen  500 MG tablet Commonly known as: TYLENOL  Take 500 mg by mouth every 6 (six) hours as needed for moderate pain (pain score 4-6) or mild pain (pain score 1-3).   acyclovir  400 MG tablet Commonly known as: ZOVIRAX  Take 1 tablet (400 mg total) by mouth 2 (two) times daily.   aspirin  EC 81 MG tablet Take 81 mg by mouth daily. Swallow whole.   atorvastatin  40 MG tablet Commonly known as: LIPITOR Take 1 tablet (40 mg total) by mouth daily.   atovaquone  750  MG/5ML suspension Commonly known as: MEPRON  TAKE 10 MLS (1,500 MG TOTAL) BY MOUTH 2 (TWO) TIMES DAILY.   calcium  carbonate 500 MG chewable tablet Commonly known as: TUMS - dosed in mg elemental calcium  Chew 1 tablet by mouth daily as needed for indigestion or heartburn.   cholecalciferol  25 MCG (1000 UNIT) tablet Commonly known as: VITAMIN D3 Take 1,000 Units by mouth daily. Instructed to increase to 4,000 units daily   clopidogrel  75 MG tablet Commonly known as: PLAVIX  Take 1 tablet (75 mg total) by mouth daily with breakfast.   docusate sodium  50 MG capsule Commonly known as: COLACE Take 50 mg by mouth 2 (two) times daily.   DRY EYES OP Place 1 drop into both eyes daily as needed (dry eyes).   furosemide  20 MG tablet Commonly known as: LASIX  Take 1 tablet (20 mg total) by mouth daily.   Magnesium  Citrate 200 MG Tabs Take 200 mg by mouth daily.   metoprolol  succinate 25 MG 24 hr tablet Commonly known as: TOPROL -XL Take 1.5 tablets (37.5 mg total) by mouth daily.   mirtazapine  15 MG tablet Commonly known as: REMERON  TAKE 1 TABLET BY MOUTH EVERYDAY AT BEDTIME   ondansetron  8 MG tablet Commonly known as: Zofran  Take 1 tablet (8 mg total) by mouth every 8 (eight) hours as needed (Nausea or vomiting).   pantoprazole  40 MG tablet Commonly known as: PROTONIX  TAKE 1 TABLET BY MOUTH 2 TIMES DAILY. TAKE 30-60 MINUTES BEFORE BREAKFAST AND DINNER.   potassium chloride  SA 20 MEQ tablet Commonly known as: Klor-Con  M20 Take 1 tablet (20 mEq total) by mouth daily.   PRESERVISION AREDS 2 PO Take 1 tablet by mouth 2 (two) times daily.   prochlorperazine  5 MG tablet Commonly known as: COMPAZINE  Take 5 mg by mouth every 6 (six) hours as needed for nausea or vomiting.   senna-docusate 8.6-50 MG tablet Commonly known as: Senokot-S Take 1 tablet by mouth daily as needed for mild constipation or moderate constipation.        Aspirin  prescribed at discharge?  Yes High  Intensity Statin Prescribed? (Lipitor 40-80mg  or Crestor 20-40mg ): Yes Beta Blocker Prescribed? Yes For EF <40%, was ACEI/ARB Prescribed? No: EF > 40% ADP Receptor Inhibitor Prescribed? (i.e. Plavix  etc.-Includes Medically Managed Patients):  Yes For EF <40%, Aldosterone Inhibitor Prescribed? No: EF > 40% Was EF assessed during THIS hospitalization? No: EF normal by echo prior to admission Was Cardiac Rehab II ordered? (Included Medically managed Patients): Yes   Outstanding Labs/Studies   None.  Duration of Discharge Encounter   Greater than 30 minutes including physician time.  Signed, Bernardino CHRISTELLA Abigail DEVONNA [facility] Pager: [phone] 07/01/2024, 12:13 PM      [1] No Known Allergies  "

## 2024-07-01 NOTE — Progress Notes (Signed)
 AVS teaching provided to patient. All questions/concerns answered. PIV and telemetry removed.

## 2024-07-01 NOTE — Discharge Instructions (Addendum)
 Please review your medication list carefully as there have been additions. It is very important to take all medications as directed and contact our office with any concerns. If you have issues filling your medications please notify us . Please do not lift more than 5 pounds with your right arm for the next 3-4 days. Be careful when standing to not push up with the right arm. If you notice swelling or bleeding from the cardiac cath site (right wrist area), please call [phone]. You may remove the current dressing in 48 hours.   Some PCP options in [place] area- not a comprehensive list  [facility]- [phone] [facility]- [phone] [facility]- [phone] [facility]- [phone] [facility]- [phone] Nichole [phone]  or [facility] Physician Referral Line [phone]   [facility] Primary Care Provider List  [facility] at [facility] [address], [place], [place] [address] [phone]  [facility] at [name] [address], [place], [place] [address] [phone]  [facility] Patient [facility] [address] [place], [place], [place] [address] [phone]  [facility] Care at [facility] [address], [place], [place] [address] [phone]  [facility] Care at [facility] [address], [address] [place], [place], [place] [address] [phone]  [facility] [address] [place], [place], [place] [address] [phone]  [facility] [address], [place], [place] [address] [phone]  [facility] [address] [place], [place] [address] [phone]  [facility] [address], [place], [place] [address] [phone]  [facility] [address], [place], [place]  [address] [phone]  [facility] and [facility] [address] [place], [address], [place], [place] [address] [phone]  [facility] and Adult Medicine [address], [place], [place] [address] [phone]  [facility] [address] [place], [place], [place] [address] [phone]  [facility] at [facility] [address], [place], [place] [address] [phone]  [facility] at [facility] [address] [place], [place] [address] [phone]

## 2024-07-01 NOTE — TOC CM/SW Note (Signed)
 Transition of Care [facility]) - Inpatient Brief Assessment   Patient Details  Name: Jean Davidson MRN: [id number] Date of Birth: May 14, 1946  Transition [facility]) CM/SW Contact:    Lauraine JAYSON Carpen, LCSW Phone Number: 07/01/2024, 9:44 AM   Clinical Narrative: Patient has orders to discharge home today. Chart reviewed. No PCP. CSW added resource list to AVS for [employer]. No further concerns. CSW signing off.  Transition of Care Asessment: Insurance and Status: Insurance coverage has been reviewed Patient has primary care physician: No Home environment has been reviewed: Single family home Prior level of function:: Not documented Prior/Current Home Services: No current home services Social Drivers of Health Review: SDOH reviewed no interventions necessary Readmission risk has been reviewed: Yes Transition of care needs: no transition of care needs at this time

## 2024-07-02 LAB — LIPOPROTEIN A (LPA): Lipoprotein (a): 119 nmol/L — ABNORMAL HIGH

## 2024-07-08 NOTE — Progress Notes (Unsigned)
 " [facility] [phone]   [phone]  PROGRESS NOTE  Patient Care Team: Patient, [name] as PCP - General (General Practice) Darron Deatrice LABOR, MD as PCP - Cardiology (Cardiology) Darren Liborio Backers, MD as Referring Physician (Hematology and Oncology) Guinevere File, MD as Consulting Physician (Internal Medicine) Fleeta Rothman, Jomarie SAILOR, MD as Consulting Physician (Infectious Diseases) Marda Maranda Foots An, MD as Consulting Physician (Hematology and Oncology) Honora Lenon Irving, NP as Nurse Practitioner (Hematology and Oncology) Federico Norleen ONEIDA MADISON, MD as Consulting Physician (Hematology and Oncology) Duke, Jon Garre, [place] as Physician Assistant (Cardiology)  Hematological/Oncological History # Relapsed/Refractory IgA Lambda Multiple Myeloma, In Remission 2016: Underwent autologous stem cell transplant 2017: Underwent allogenic stem cell transplant 10/16/2022: Completed CAR-T therapy with [facility] 09/2023: Started on Elrexfio  due to progression of disease 01/02/2024: Last visit at [facility] where induction therapy was completed 01/09/2024: establish care with Dr. Federico.   Interval History:  Jean Davidson 79 y.o. female with medical history significant for relapsed/refractory multiple myeloma who presents for a follow up visit. The patient's last visit was on 06/25/2024. In the interim since the last visit she underwent PCI to the LAD and LCx on 06/30/2024.   Ms. Severs reports she is recovering from her cardiac cath after two stent placements. She plans to start cardiac rehab soon. She was able to walk into today's appts without her walker. She reports her appetite and weight are overall stable. She denies nausea, vomiting or bowel habit changes. She has bruised noticeably in her right hand/forearm since the hospitalization which is slowly improving. She denies overt signs of bleeding including hematochezia, melena or hematuria. She  continues to require a foley catheter. She denies fevers, chills, sweats, shortness of breath, chest pain or cough. She has no other complaints.rest of the ROS is below.    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   Osteopenia    Spinal stenosis in cervical region    Varicose veins of lower extremities with inflammation     SURGICAL HISTORY: Past Surgical History:  Procedure Laterality Date   ANKLE SURGERY Left    BLADDER SUSPENSION  1988/1989   x2   COLONOSCOPY     CORONARY STENT INTERVENTION N/A 06/30/2024   Procedure: CORONARY STENT INTERVENTION;  Surgeon: Darron Deatrice LABOR, MD;  Location: [facility] INVASIVE CV LAB;  Service: Cardiovascular;  Laterality: N/A;   ESOPHAGOGASTRODUODENOSCOPY     IR IMAGING GUIDED PORT INSERTION  03/01/2018   LEFT HEART CATH AND CORONARY ANGIOGRAPHY Left 06/30/2024   Procedure: LEFT HEART CATH AND CORONARY ANGIOGRAPHY;  Surgeon: Darron Deatrice LABOR, MD;  Location: [facility] INVASIVE CV LAB;  Service: Cardiovascular;  Laterality: Left;   LIMBAL STEM CELL TRANSPLANT     x2   PORT-A-CATH REMOVAL     SHOULDER SURGERY Right 04/06/2013   right  UPPER GASTROINTESTINAL ENDOSCOPY      SOCIAL HISTORY: Social History   Socioeconomic History   Marital status: Divorced    Spouse name: Not on file   Number of children: Not on file   Years of education: Not on file   Highest education level: Not on file  Occupational History   Not on file  Tobacco Use    Smoking status: Former    Current packs/day: 0.00    Average packs/day: 0.5 packs/day for 4.0 years (2.0 ttl pk-yrs)    Types: Cigarettes    Start date: 06/26/1964    Quit date: 06/26/1968    Years since quitting: 56.0   Smokeless tobacco: Never  Vaping Use   Vaping status: Never Used  Substance and Sexual Activity   Alcohol  use: Yes    Alcohol /week: 1.0 standard drink of alcohol     Types: 1 Cans of beer per week    Comment: 1 per week per pt   Drug use: No   Sexual activity: Not on file  Other Topics Concern   Not on file  Social History Narrative   Jean Davidson is daughter kyleigh, nannini will,  full code ( reviewed 22)   Social Drivers of Health   Tobacco Use: Medium Risk (06/30/2024)   Patient History    Smoking Tobacco Use: Former    Smokeless Tobacco Use: Never    Passive Exposure: Not on file  Financial Resource Strain: Low Risk  (10/09/2023)   Received from [facility]   Overall Financial Resource Strain (CARDIA)    Difficulty of Paying Living Expenses: Not hard at all  Food Insecurity: No Food Insecurity (07/01/2024)   Epic    Worried About Radiation Protection Practitioner of Food in the Last Year: Never true    Ran Out of Food in the Last Year: Never true  Transportation Needs: No Transportation Needs (07/01/2024)   Epic    Lack of Transportation (Medical): No    Lack of Transportation (Non-Medical): No  Physical Activity: Not on file  Stress: Not on file  Social Connections: Moderately Integrated (07/01/2024)   Social Connection and Isolation Panel    Frequency of Communication with Friends and Family: More than three times a week    Frequency of Social Gatherings with Friends and Family: Twice a week    Attends Religious Services: 1 to 4 times per year    Active Member of [employer] or Organizations: Yes    Attends Banker Meetings: 1 to 4 times per year    Marital Status: Divorced  Intimate Partner Violence: Not At Risk (07/01/2024)   Epic    Fear of Current  or Ex-Partner: No    Emotionally Abused: No    Physically Abused: No    Sexually Abused: No  Depression (PHQ2-9): Low Risk (06/04/2024)   Depression (PHQ2-9)    PHQ-2 Score: 0  Alcohol  Screen: Not on file  Housing: Low Risk (07/01/2024)   Epic    Unable to Pay for Housing in the Last Year: No    Number of Times Moved in the Last Year: 0    Homeless in the Last Year: No  Utilities: Not At Risk (07/01/2024)   Epic    Threatened with loss of utilities: No  Health Literacy: Not on file    FAMILY HISTORY: Family History  Problem Relation Age of Onset   Heart disease Mother    Heart failure Father    Heart disease Father    Throat cancer  Father    Skin cancer Father    Diabetes Father    Kidney disease Father    Hypertension Brother    Heart disease Brother        congenital shunt, ?    Colon cancer Paternal Aunt 70   Skin cancer Brother    Stomach cancer Neg Hx    Esophageal cancer Neg Hx    Rectal cancer Neg Hx     ALLERGIES:  has no known allergies.  MEDICATIONS:  Current Outpatient Medications  Medication Sig Dispense Refill   acetaminophen  (TYLENOL ) 500 MG tablet Take 500 mg by mouth every 6 (six) hours as needed for moderate pain (pain score 4-6) or mild pain (pain score 1-3).     acyclovir  (ZOVIRAX ) 400 MG tablet Take 1 tablet (400 mg total) by mouth 2 (two) times daily. 180 tablet 1   Artificial Tear Ointment (DRY EYES OP) Place 1 drop into both eyes daily as needed (dry eyes).     aspirin  EC 81 MG tablet Take 81 mg by mouth daily. Swallow whole.     atorvastatin  (LIPITOR) 40 MG tablet Take 1 tablet (40 mg total) by mouth daily. 30 tablet 5   atovaquone  (MEPRON ) 750 MG/5ML suspension TAKE 10 MLS (1,500 MG TOTAL) BY MOUTH 2 (TWO) TIMES DAILY. 420 mL 3   calcium  carbonate (TUMS - DOSED IN MG ELEMENTAL CALCIUM ) 500 MG chewable tablet Chew 1 tablet by mouth daily as needed for indigestion or heartburn.     cholecalciferol  (VITAMIN D3) 25 MCG (1000 UNIT) tablet Take 1,000  Units by mouth daily. Instructed to increase to 4,000 units daily     clopidogrel  (PLAVIX ) 75 MG tablet Take 1 tablet (75 mg total) by mouth daily with breakfast. 30 tablet 5   docusate sodium  (COLACE) 50 MG capsule Take 50 mg by mouth 2 (two) times daily. (Patient taking differently: Take 50 mg by mouth 2 (two) times daily as needed.)     metoprolol  succinate (TOPROL -XL) 25 MG 24 hr tablet Take 1.5 tablets (37.5 mg total) by mouth daily. 135 tablet 3   mirtazapine  (REMERON ) 15 MG tablet TAKE 1 TABLET BY MOUTH EVERYDAY AT BEDTIME 90 tablet 1   Multiple Vitamins-Minerals (PRESERVISION AREDS 2 PO) Take 1 tablet by mouth 2 (two) times daily.     ondansetron  (ZOFRAN ) 8 MG tablet Take 1 tablet (8 mg total) by mouth every 8 (eight) hours as needed (Nausea or vomiting). 60 tablet 1   pantoprazole  (PROTONIX ) 40 MG tablet TAKE 1 TABLET BY MOUTH 2 TIMES DAILY. TAKE 30-60 MINUTES BEFORE BREAKFAST AND DINNER. 180 tablet 0   prochlorperazine  (COMPAZINE ) 5 MG tablet Take 5 mg by mouth every 6 (six) hours as needed for nausea or vomiting.     potassium chloride  SA (KLOR-CON  M20) 20 MEQ tablet Take 1 tablet (20 mEq total) by mouth daily. (Patient not taking: Reported on 07/09/2024) 90 tablet 1   senna-docusate (SENOKOT-S) 8.6-50 MG tablet Take 1 tablet by mouth daily as needed for mild constipation or moderate constipation. (Patient not taking: Reported on 07/09/2024)     No current facility-administered medications for this visit.    REVIEW OF SYSTEMS:   Constitutional: ( - ) fevers, ( - )  chills , ( - ) night sweats Eyes: ( - ) blurriness of vision, ( - ) double vision, ( - ) watery eyes Ears, nose, mouth, throat, and face: ( - ) mucositis, ( - ) sore throat Respiratory: (+ ) cough, ( - )  dyspnea, ( - ) wheezes Cardiovascular: ( - ) palpitation, ( - ) chest discomfort, ( - ) lower extremity swelling Gastrointestinal:  ( +) nausea, ( - ) heartburn, ( - ) change in bowel habits Skin: ( - ) abnormal skin  rashes Lymphatics: ( - ) new lymphadenopathy, ( - ) easy bruising Neurological: ( - ) numbness, ( - ) tingling, ( - ) new weaknesses Behavioral/Psych: ( - ) mood change, ( - ) new changes  All other systems were reviewed with the patient and are negative.  PHYSICAL EXAMINATION: ECOG PERFORMANCE STATUS: 1 - Symptomatic but completely ambulatory  There were no vitals filed for this visit.    There were no vitals filed for this visit.  Day 15, Cycle 9 07/09/24  Weight 148 lb 8 oz (67.4 kg)  Temp 98.4 F (36.9 C)  Temp src Oral  Pulse 82  Resp 18  BP 140/72 !    PHYSICAL EXAM:  ECOG PERFORMANCE STATUS: 1 - Symptomatic but completely ambulatory  GENERAL: Well-appearing elderly Caucasian female, alert, no distress and comfortable SKIN: skin color, texture, turgor are normal, no rashes or significant lesions EYES: conjunctiva are pink and non-injected, sclera clear LUNGS:normal breathing effort. No wheezes or rhonchi.  HEART: regular rate & rhythm and no murmurs and no lower extremity edema Musculoskeletal: no cyanosis of digits and no clubbing  PSYCH: alert & oriented x 3, fluent speech NEURO: no focal motor/sensory deficits   LABORATORY DATA:  I have reviewed the data as listed    Latest Ref Rng & Units 07/09/2024    8:26 AM 07/01/2024    4:17 AM 06/25/2024    7:49 AM  CBC  WBC 4.0 - 10.5 K/uL 2.9  2.7  2.2   Hemoglobin 12.0 - 15.0 g/dL 89.3  9.4  89.4   Hematocrit 36.0 - 46.0 % 32.0  29.4  32.1   Platelets 150 - 400 K/uL 124  107  112        Latest Ref Rng & Units 07/09/2024    8:26 AM 07/01/2024    4:17 AM 06/25/2024    7:49 AM  CMP  Glucose 70 - 99 mg/dL 873  92  96   BUN 8 - 23 mg/dL 14  17  16    Creatinine 0.44 - 1.00 mg/dL 9.14  9.21  9.18   Sodium 135 - 145 mmol/L 142  138  141   Potassium 3.5 - 5.1 mmol/L 3.6  3.8  3.4   Chloride 98 - 111 mmol/L 104  104  106   CO2 22 - 32 mmol/L 23  23  25    Calcium  8.9 - 10.3 mg/dL 9.3  8.7  9.3   Total Protein 6.5 -  8.1 g/dL 6.4   6.1   Total Bilirubin 0.0 - 1.2 mg/dL 0.3   0.3   Alkaline Phos 38 - 126 U/L 103   73   AST 15 - 41 U/L 37   29   ALT 0 - 44 U/L 29   16     Lab Results  Component Value Date   MPROTEIN Not Observed 06/11/2024   MPROTEIN Not Observed 05/14/2024   MPROTEIN Not Observed 03/26/2024   Lab Results  Component Value Date   KPAFRELGTCHN 0.8 (L) 06/11/2024   KPAFRELGTCHN <0.7 (L) 05/14/2024   KPAFRELGTCHN <0.7 (L) 03/26/2024   LAMBDASER <1.5 (L) 06/11/2024   LAMBDASER <1.5 (L) 05/14/2024   LAMBDASER <1.5 (L) 03/26/2024   KAPLAMBRATIO >0.53 06/11/2024  KAPLAMBRATIO UNABLE TO CALCULATE 05/14/2024   KAPLAMBRATIO UNABLE TO CALCULATE 03/26/2024     RADIOGRAPHIC STUDIES: CT CORONARY MORPH W/CTA COR W/SCORE W/CA W/CM &/OR WO/CM Addendum Date: 07/08/2024 ADDENDUM REPORT: 07/08/2024 22:14 EXAM: OVER-READ INTERPRETATION  CT CHEST The following report is an over-read performed by radiologist Dr. Oneil Devonshire of [facility] on 07/08/2024. This over-read does not include interpretation of cardiac or coronary anatomy or pathology. The coronary calcium  score/coronary CTA interpretation by the cardiologist is attached. COMPARISON:  03/09/2024 FINDINGS: Cardiovascular: Scattered atherosclerotic calcifications of the thoracic aorta are noted. No aneurysmal dilatation or dissection is seen. No pulmonary emboli are seen. Mediastinum/Nodes: There are no enlarged lymph nodes within the visualized mediastinum. Lungs/Pleura: There is no pleural effusion. Calcified granuloma is noted in the right lower lobe. This is stable from the prior exam. No sizable parenchymal nodules are noted. Previously seen infiltrate has resolved. Upper abdomen: Moderate-sized sliding-type hiatal hernia is noted. Cholelithiasis is seen without complicating factors. Musculoskeletal/Chest wall: No chest wall mass or suspicious osseous findings within the visualized chest. IMPRESSION: Aortic Atherosclerosis  (ICD10-I70.0). Cholelithiasis without complicating factors. Hiatal hernia. Electronically Signed   By: Oneil Devonshire M.D.   On: 07/08/2024 22:14   Result Date: 07/08/2024 HISTORY: systolic heart failure EXAM: Cardiac/Coronary  CT PROTOCOL: A non-contrast, gated CT scan was obtained with axial slices of 2.5 mm through the heart for calcium  scoring. Calcium  scoring was performed using the Agatston method. A 120 kV prospective, gated, contrast cardiac CT scan was obtained. Gantry rotation speed was 230 msec and collimation was 0.63 mm. Two sublingual nitroglycerin  tablets (0.8 mg) were given. The 3D data set was reconstructed with motion correction for the best systolic or diastolic phase. Images were analyzed on a dedicated workstation using MPR, MIP, and VRT modes. The patient received 95 cc of contrast. FINDINGS: Image quality: Average. Artifact: Limited. Coronary calcium  score is 1190, which places the patient in the 94th percentile for age and sex matched control. Coronary arteries: Normal coronary origins.  Right dominance. Left Main Coronary Artery: Normal caliber vessel, originates from the left coronary cusp, bifurcates to form a left anterior descending artery (LAD) and a left circumflex artery (LCX). Mild stenosis (25-49%) within the left main due to calcified plaque. Left Anterior Descending Artery: Moderate stenosis (50-69%) due to mixed plaque at the ostial/proximal LAD. Severe stenosis (70-99%) due to mixed plaque at the proximal/mid LAD. Mid to apical LAD patent with luminal irregularities due to mixed plaque. First Diagonal branch: Patent Left Circumflex Artery: Mild stenosis (25-49%) calcified plaque at the ostial LCX. Mild stenosis (25-49%) soft plaque at the proximal/mid LCX. Mid to distal LCX patent. First Obtuse Marginal branch: Normal caliber, large, bifurcates into superior inferior branches, patent Right Coronary Artery: Mild stenosis (25-49%) mixed plaque at the mid RCA. Otherwise patent.  Aorta: Normal size, 38 mm at the mid ascending aorta (level of the PA bifurcation) measured double oblique. Aortic atherosclerosis. Aortic Valve: Native valve, trileaflet aortic valve, no significant calcification. Mitral valve: Native valve, moderate to severe mitral annular calcification. Other findings: Normal pulmonary vein drainage into the left atrium. Normal left atrial appendage without thrombus. Normal size of the pulmonary artery. Please see separate report from [facility] for non-cardiac findings. IMPRESSION: 1. Coronary calcium  score of 1190. This was 94th percentile for age and sex matched control. 2. Normal coronary origins with right dominance. 3. CAD-RADS 4 Severe coronary disease. 4. CT FFR will be performed and reported separately further evaluate left main/LAD distribution. 5. Aortic atherosclerosis.  6.  Mitral annular calcification, correlate with echocardiography RECOMMENDATION: CT FFR results pending. Consider symptom-guided anti-ischemic pharmacotherapy as well as risk factor modification per guideline directed care. Electronically Signed: By: Madonna Large On: 06/12/2024 13:09   CARDIAC CATHETERIZATION Result Date: 06/30/2024   Mid LAD lesion is 99% stenosed.   Ost LAD to Prox LAD lesion is 20% stenosed.   Prox Cx to Mid Cx lesion is 99% stenosed.   1st Mrg lesion is 30% stenosed.   Mid RCA lesion is 30% stenosed.   A drug-eluting stent was successfully placed using a STENT ONYX FRONTIER 2.5X30.   A drug-eluting stent was successfully placed using a STENT ONYX FRONTIER 2.5X15.   Post intervention, there is a 0% residual stenosis.   Post intervention, there is a 0% residual stenosis.   In the absence of any other complications or medical issues, we expect the patient to be ready for discharge from an interventional cardiology perspective on 07/01/2024.   Recommend uninterrupted dual antiplatelet therapy with Aspirin  81mg  daily and Clopidogrel  75mg  daily for a minimum of 6 months  (stable ischemic heart disease-Class I recommendation). 1.  Severe two-vessel coronary artery disease involving mid LAD and mid left circumflex.  The coronary arteries are moderately calcified. 2.  Left ventricular angiography was not performed.  EF was normal by echo.  Normal left ventricular end-diastolic pressure. 3.  Successful angioplasty and drug-eluting stent placement to the mid LAD and mid left circumflex. Recommendations: Observe overnight. Will add atorvastatin .   CT CORONARY FFR DATA PREP & FLUID ANALYSIS Result Date: 06/12/2024 EXAM: CT FFR ANALYSIS CLINICAL DATA:  Heart Failure FINDINGS: FFRct analysis was performed on the original cardiac CT angiogram dataset. Diagrammatic representation of the FFRct analysis is provided in a separate PDF document in PACS. This dictation was created using the PDF document and an interactive 3D model of the results. 3D model is not available in the EMR/PACS. Normal FFR range is >0.80. Indeterminate (grey) zone is 0.76-0.80. FFR delta of 0.13 is considered significant. 1. Left Main: FFR = 1.00 2. LAD: Proximal FFR = 0.95, Mid FFR = 0.63, Distal FFR = 0.58 3. LCX: Proximal FFR = 0.98, distal FFR = 0.65 4. RCA: Proximal FFR = 0.98, mid FFR =0.95, Distal FFR = 0.91 IMPRESSION: 1. CT FFR analysis showed significant stenosis at proximal to mid LAD. 2. CT FFR analysis illustrates flow-limiting disease in the distal LCX, this is likely secondary to slow flow degradation due to vessel tapering rather than obstructive disease. Clinical correlation required. RECOMMENDATIONS: Invasive angiography recommended to further evaluate the LAD distribution. Guideline-directed medical therapy and aggressive risk factor modification for secondary prevention of coronary artery disease. Electronically Signed   By: Madonna Large   On: 06/12/2024 13:20    ASSESSMENT & PLAN ALVENIA TREESE is a 79 y.o.  female with medical history significant for relapsed/refractory multiple myeloma who  presents for a follow up visit.   # Relapsed/Refractory IgA Lambda Multiple Myeloma, In Remission -- Patient is undergone numerous prior therapies including autosomal stem cell transplant, allogenic stem cell transplant, CAR-T therapy, and now bispecific therapy --Patient completed her induction of bispecific therapy at [facility] -- Patient presents to continue maintenance therapy. PLAN: --Labs today show WBC 2.9, Hgb 10.6, Plt 124K, creatinine and calcium  levels are normal.  --Most recent myeloma panel from 06/11/2024 showed M protein is not detectable.  --Cycle 9-day 15 of Elrexfio  today without any dose modifications.  --Plan for IVIG to be administered if IgG  is less than 400. Last 803 on 06/11/2024.  -- Will plan for q 2 weekly maintenance Elrexfio  with clinic visits every treatment.   #Supportive Care -- zofran  8mg  q8H PRN and compazine  10mg  PO q6H for nausea -- acyclovir  400mg  PO BID for VCZ prophylaxis --Continue atovaquone  1500 mg twice daily -- IVIG to be administered when IgG are less than 400 -- EMLA  cream for port -- no pain medication required at this time.   No orders of the defined types were placed in this encounter.   All questions were answered. The patient knows to call the clinic with any problems, questions or concerns.   I have spent a total of 30 minutes minutes of face-to-face and non-face-to-face time, preparing to see the patient, performing a medically appropriate examination, counseling and educating the patient, documenting clinical information in the electronic health record, independently interpreting results and communicating results to the patient, and care coordination.   Johnston Police PA-C Dept of Hematology and Oncology [facility] Phone: [phone]     07/09/2024 2:52 PM  "

## 2024-07-09 ENCOUNTER — Inpatient Hospital Stay: Attending: Hematology and Oncology

## 2024-07-09 ENCOUNTER — Inpatient Hospital Stay: Admitting: Physician Assistant

## 2024-07-09 ENCOUNTER — Inpatient Hospital Stay

## 2024-07-09 VITALS — BP 140/72 | HR 82 | Temp 98.4°F | Resp 18 | Wt 148.5 lb

## 2024-07-09 DIAGNOSIS — C9002 Multiple myeloma in relapse: Secondary | ICD-10-CM | POA: Insufficient documentation

## 2024-07-09 DIAGNOSIS — Z5111 Encounter for antineoplastic chemotherapy: Secondary | ICD-10-CM | POA: Diagnosis present

## 2024-07-09 DIAGNOSIS — C9001 Multiple myeloma in remission: Secondary | ICD-10-CM

## 2024-07-09 LAB — CBC WITH DIFFERENTIAL (CANCER CENTER ONLY)
Abs Immature Granulocytes: 0.02 K/uL (ref 0.00–0.07)
Basophils Absolute: 0 K/uL (ref 0.0–0.1)
Basophils Relative: 0 %
Eosinophils Absolute: 0 K/uL (ref 0.0–0.5)
Eosinophils Relative: 0 %
HCT: 32 % — ABNORMAL LOW (ref 36.0–46.0)
Hemoglobin: 10.6 g/dL — ABNORMAL LOW (ref 12.0–15.0)
Immature Granulocytes: 1 %
Lymphocytes Relative: 40 %
Lymphs Abs: 1.2 K/uL (ref 0.7–4.0)
MCH: 33.9 pg (ref 26.0–34.0)
MCHC: 33.1 g/dL (ref 30.0–36.0)
MCV: 102.2 fL — ABNORMAL HIGH (ref 80.0–100.0)
Monocytes Absolute: 0.4 K/uL (ref 0.1–1.0)
Monocytes Relative: 13 %
Neutro Abs: 1.4 K/uL — ABNORMAL LOW (ref 1.7–7.7)
Neutrophils Relative %: 46 %
Platelet Count: 124 K/uL — ABNORMAL LOW (ref 150–400)
RBC: 3.13 MIL/uL — ABNORMAL LOW (ref 3.87–5.11)
RDW: 17 % — ABNORMAL HIGH (ref 11.5–15.5)
WBC Count: 2.9 K/uL — ABNORMAL LOW (ref 4.0–10.5)
nRBC: 0 % (ref 0.0–0.2)

## 2024-07-09 LAB — CMP (CANCER CENTER ONLY)
ALT: 29 U/L (ref 0–44)
AST: 37 U/L (ref 15–41)
Albumin: 4.2 g/dL (ref 3.5–5.0)
Alkaline Phosphatase: 103 U/L (ref 38–126)
Anion gap: 15 (ref 5–15)
BUN: 14 mg/dL (ref 8–23)
CO2: 23 mmol/L (ref 22–32)
Calcium: 9.3 mg/dL (ref 8.9–10.3)
Chloride: 104 mmol/L (ref 98–111)
Creatinine: 0.85 mg/dL (ref 0.44–1.00)
GFR, Estimated: >60 mL/min
Glucose, Bld: 126 mg/dL — ABNORMAL HIGH (ref 70–99)
Potassium: 3.6 mmol/L (ref 3.5–5.1)
Sodium: 142 mmol/L (ref 135–145)
Total Bilirubin: 0.3 mg/dL (ref 0.0–1.2)
Total Protein: 6.4 g/dL — ABNORMAL LOW (ref 6.5–8.1)

## 2024-07-09 LAB — MAGNESIUM: Magnesium: 1.9 mg/dL (ref 1.7–2.4)

## 2024-07-09 MED ORDER — ELRANATAMAB-BCMM 76 MG/1.9ML ~~LOC~~ SOLN
76.0000 mg | Freq: Once | SUBCUTANEOUS | Status: AC
  Administered 2024-07-09: 76 mg via SUBCUTANEOUS
  Filled 2024-07-09: qty 1.9

## 2024-07-09 NOTE — Patient Instructions (Signed)
 [facility] - A DEPT OF [facility]  Discharge Instructions: Thank you for choosing [facility] to provide your oncology and hematology care.   If you have a lab appointment with the Cancer Center, please go directly to the Cancer Center and check in at the registration area.   Wear comfortable clothing and clothing appropriate for easy access to any Portacath or PICC line.   We strive to give you quality time with your provider. You may need to reschedule your appointment if you arrive late (15 or more minutes).  Arriving late affects you and other patients whose appointments are after yours.  Also, if you miss three or more appointments without notifying the office, you may be dismissed from the clinic at the provider's discretion.      For prescription refill requests, have your pharmacy contact our office and allow 72 hours for refills to be completed.    Today you received the following chemotherapy and/or immunotherapy agents: Elranatamab -bcmm (Elrexfio )   To help prevent nausea and vomiting after your treatment, we encourage you to take your nausea medication as directed.  BELOW ARE SYMPTOMS THAT SHOULD BE REPORTED IMMEDIATELY: *FEVER GREATER THAN 100.4 F (38 C) OR HIGHER *CHILLS OR SWEATING *NAUSEA AND VOMITING THAT IS NOT CONTROLLED WITH YOUR NAUSEA MEDICATION *UNUSUAL SHORTNESS OF BREATH *UNUSUAL BRUISING OR BLEEDING *URINARY PROBLEMS (pain or burning when urinating, or frequent urination) *BOWEL PROBLEMS (unusual diarrhea, constipation, pain near the anus) TENDERNESS IN MOUTH AND THROAT WITH OR WITHOUT PRESENCE OF ULCERS (sore throat, sores in mouth, or a toothache) UNUSUAL RASH, SWELLING OR PAIN  UNUSUAL VAGINAL DISCHARGE OR ITCHING   Items with * indicate a potential emergency and should be followed up as soon as possible or go to the Emergency Department if any problems should occur.  Please show the CHEMOTHERAPY ALERT CARD or  IMMUNOTHERAPY ALERT CARD at check-in to the Emergency Department and triage nurse.  Should you have questions after your visit or need to cancel or reschedule your appointment, please contact [facility] - A DEPT OF [facility]  Dept: [phone]  and follow the prompts.  Office hours are 8:00 a.m. to 4:30 p.m. Monday - Friday. Please note that voicemails left after 4:00 p.m. may not be returned until the following business day.  We are closed weekends and major holidays. You have access to a nurse at all times for urgent questions. Please call the main number to the clinic Dept: [phone] and follow the prompts.   For any non-urgent questions, you may also contact your provider using MyChart. We now offer e-Visits for anyone 64 and older to request care online for non-urgent symptoms. For details visit [email or web address].   Also download the MyChart app! Go to the app store, search MyChart, open the app, select [facility], and log in with your MyChart username and password.

## 2024-07-10 ENCOUNTER — Encounter

## 2024-07-10 LAB — KAPPA/LAMBDA LIGHT CHAINS
Kappa free light chain: <0.7 mg/L — ABNORMAL LOW (ref 3.3–19.4)
Kappa, lambda light chain ratio: <0.11 — ABNORMAL LOW (ref 0.26–1.65)
Lambda free light chains: 6.3 mg/L (ref 5.7–26.3)

## 2024-07-12 ENCOUNTER — Other Ambulatory Visit: Payer: Self-pay

## 2024-07-12 LAB — MULTIPLE MYELOMA PANEL, SERUM
Albumin SerPl Elph-Mcnc: 3.5 g/dL (ref 2.9–4.4)
Albumin/Glob SerPl: 1.6 (ref 0.7–1.7)
Alpha 1: 0.3 g/dL (ref 0.0–0.4)
Alpha2 Glob SerPl Elph-Mcnc: 0.7 g/dL (ref 0.4–1.0)
B-Globulin SerPl Elph-Mcnc: 0.9 g/dL (ref 0.7–1.3)
Gamma Glob SerPl Elph-Mcnc: 0.4 g/dL (ref 0.4–1.8)
Globulin, Total: 2.3 g/dL (ref 2.2–3.9)
IgA: <5 mg/dL — ABNORMAL LOW (ref 64–422)
IgG (Immunoglobin G), Serum: 422 mg/dL — ABNORMAL LOW (ref 586–1602)
IgM (Immunoglobulin M), Srm: <5 mg/dL — ABNORMAL LOW (ref 26–217)
Total Protein ELP: 5.8 g/dL — ABNORMAL LOW (ref 6.0–8.5)

## 2024-07-14 ENCOUNTER — Encounter: Payer: Self-pay | Admitting: Medical

## 2024-07-14 ENCOUNTER — Telehealth: Payer: Self-pay

## 2024-07-14 ENCOUNTER — Ambulatory Visit: Attending: Medical | Admitting: Medical

## 2024-07-14 VITALS — BP 126/66 | HR 83 | Ht 62.0 in | Wt 151.6 lb

## 2024-07-14 DIAGNOSIS — I5022 Chronic systolic (congestive) heart failure: Secondary | ICD-10-CM | POA: Diagnosis not present

## 2024-07-14 DIAGNOSIS — Z79899 Other long term (current) drug therapy: Secondary | ICD-10-CM | POA: Diagnosis not present

## 2024-07-14 DIAGNOSIS — C9002 Multiple myeloma in relapse: Secondary | ICD-10-CM

## 2024-07-14 DIAGNOSIS — I2510 Atherosclerotic heart disease of native coronary artery without angina pectoris: Secondary | ICD-10-CM

## 2024-07-14 DIAGNOSIS — I471 Supraventricular tachycardia, unspecified: Secondary | ICD-10-CM

## 2024-07-14 DIAGNOSIS — I10 Essential (primary) hypertension: Secondary | ICD-10-CM | POA: Diagnosis not present

## 2024-07-14 DIAGNOSIS — M6289 Other specified disorders of muscle: Secondary | ICD-10-CM

## 2024-07-14 MED ORDER — CLOPIDOGREL BISULFATE 75 MG PO TABS: 75.0000 mg | ORAL_TABLET | Freq: Every day | ORAL | 3 refills | Status: AC

## 2024-07-14 MED ORDER — ATORVASTATIN CALCIUM 40 MG PO TABS: 40.0000 mg | ORAL_TABLET | Freq: Every day | ORAL | 3 refills | Status: AC

## 2024-07-14 NOTE — Telephone Encounter (Signed)
 [facility] is requesting a refill on medication furosemide  40 mg tablet. Furosemide  is no longer on pt's medication list. Does pt supposed to still be taking this medication? Please address

## 2024-07-14 NOTE — Progress Notes (Signed)
 " Cardiology Office Note   Date:  07/14/2024  ID:  Jean Davidson, DOB 1946/06/10, MRN [id number] PCP: Patient, [name]  [facility] Providers Cardiologist:  Deatrice Cage, MD Cardiology APP:  Madie Jon Garre, PA   History of Present Illness Jean Davidson is a 79 y.o. female  with a h/o HTN, paroxysmal SVT, CAD, HFimpEF, remote large pericardial effusion resolved without intervention, mild carotid stenosis, HTN, GERD, chronic anemia, relapsed/refractory multiple myeloma on Elrexfio  who presents for follow-up of cardiac catheterization.   She was initially referred to cardiology in 2016 for palpitations with a Holter monitor showing normal sinus rhythm with infrequent PVCs and PACs.  POET at that time was normal.  Repeat heart monitor in 2020 for palpitations showed multiple runs of SVT.  She was transition from Coreg  to metoprolol  and did well.  Echocardiogram in 2017 showed large pericardial effusion managed by [facility] cardiology, effusion resolved without intervention.   She was recently admitted in September 2025 with severe sepsis and acute hypoxic respiratory failure secondary to pneumonia.  Echocardiogram showed LVEF 35 to 40% with no wall motion abnormalities, grade 1 diastolic dysfunction, normal RV, moderate MR.  She was diuresed and discharged on Lasix  80 mg daily, 25 mg of Toprol .  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  Due to her strong family history of CAD, decision was made to defer ischemic evaluation in the setting of severe illness and revisit coronary CTA once recovered.   Patient was seen in follow[facility]up [facility].  She was having issues with recurrent UTIs.  She was started on Lasix  40 mg a day.  She was started on spironolactone .  Repeat echocardiogram November 2025 showed EF of 60 to 65%.   She was seen 05/20/2024 using her rollator and perpetually tired, but slowly improving. Cardiac CTA showed coronary  calcium  score of 1190, 94th percentile for age and sex matched control, severe CAD. FFR showed significant stenosis at proximal to mid LAD, and flow-limiting disease in the distal left circumflex likely secondary to slow flow degradation due to vessel tapering rather than obstructive disease.  Patient was brought in for discussion of heart cath.  She underwent left heart cath on 06/30/2024 that showed severe two-vessel CAD involving mid LAD and left circumflex with both vessels estimated at 99% stenosis.  Coronary arteries were moderately calcified.  She underwent successful PCI/DES to the mid LAD and mid left circumflex.  Post cath she was admitted for observation.  She was started on DAPT with aspirin  and Plavix  for minimum of 6 months.  She had a right radial arteriotomy bruising with small hematoma, neurovascularly intact without active bleeding.  Today, the patient is overall doing well.  She denies chest pain. She reports mild SOB has improved. No lower leg edema, lightheadedness, dizziness. She has been walking almost 2000 steps. She is walking without her rollator. She still has bruising and hematoma at cath site, but it is improving. She may undergo IgG infusions. She plans on starting cardiac rehab. She needs 90 day refills of Lipitor and plavix  90 days. Repeat labs look stable.   Studies Reviewed EKG Interpretation Date/Time:  Monday July 14 2024 10:00:07 EST Ventricular Rate:  83 PR Interval:  128 QRS Duration:  76 QT Interval:  372 QTC Calculation: 437 R Axis:   -24  Text Interpretation: Normal sinus rhythm Normal ECG When compared with ECG of 30-Jun-2024 13:33, PREVIOUS ECG IS PRESENT Confirmed by Franchester, Kiefer Opheim ([id number])  on 07/14/2024 10:06:31 AM    LHC 06/2024    Mid LAD lesion is 99% stenosed.   Ost LAD to Prox LAD lesion is 20% stenosed.   Prox Cx to Mid Cx lesion is 99% stenosed.   1st Mrg lesion is 30% stenosed.   Mid RCA lesion is 30% stenosed.   A drug-eluting stent was  successfully placed using a STENT ONYX FRONTIER 2.5X30.   A drug-eluting stent was successfully placed using a STENT ONYX FRONTIER 2.5X15.   Post intervention, there is a 0% residual stenosis.   Post intervention, there is a 0% residual stenosis.   In the absence of any other complications or medical issues, we expect the patient to be ready for discharge from an interventional cardiology perspective on 07/01/2024.   Recommend uninterrupted dual antiplatelet therapy with Aspirin  81mg  daily and Clopidogrel  75mg  daily for a minimum of 6 months (stable ischemic heart disease-Class I recommendation).   1.  Severe two-vessel coronary artery disease involving mid LAD and mid left circumflex.  The coronary arteries are moderately calcified. 2.  Left ventricular angiography was not performed.  EF was normal by echo.  Normal left ventricular end-diastolic pressure. 3.  Successful angioplasty and drug-eluting stent placement to the mid LAD and mid left circumflex.   Recommendations: Observe overnight. Will add atorvastatin .  Cardiac CTA 05/2024  1. Coronary calcium  score of 1190. This was 94th percentile for age and sex matched control.   2. Normal coronary origins with right dominance.   3. CAD-RADS 4 Severe coronary disease.   4. CT FFR will be performed and reported separately further evaluate left main/LAD distribution.   5. Aortic atherosclerosis.   6.  Mitral annular calcification, correlate with echocardiography IMPRESSION: 1. CT FFR analysis showed significant stenosis at proximal to mid LAD.   2. CT FFR analysis illustrates flow-limiting disease in the distal LCX, this is likely secondary to slow flow degradation due to vessel tapering rather than obstructive disease. Clinical correlation required.   Echo 04/2024 1. Left ventricular ejection fraction, by estimation, is 60 to 65%. The  left ventricle has normal function. The left ventricle has no regional  wall motion  abnormalities. Left ventricular diastolic parameters are  indeterminate.   2. Right ventricular systolic function is normal. The right ventricular  size is normal.   3. The mitral valve is normal in structure. No evidence of mitral valve  regurgitation. No evidence of mitral stenosis. Moderate mitral annular  calcification.   4. The aortic valve is tricuspid. Aortic valve regurgitation is not  visualized. No aortic stenosis is present.   5. The inferior vena cava is normal in size with greater than 50%  respiratory variability, suggesting right atrial pressure of 3 mmHg.    Echo 02/2024 1. Left ventricular ejection fraction, by estimation, is 35 to 40%. The  left ventricle has moderately decreased function. The left ventricle has  no regional wall motion abnormalities. Left ventricular diastolic  parameters are consistent with Grade I  diastolic dysfunction (impaired relaxation).   2. Right ventricular systolic function is normal. The right ventricular  size is normal. There is mildly elevated pulmonary artery systolic  pressure.   3. The mitral valve is normal in structure. Moderate mitral valve  regurgitation. No evidence of mitral stenosis.   4. The aortic valve is normal in structure. Aortic valve regurgitation is  not visualized. No aortic stenosis is present.   5. The inferior vena cava is normal in size with greater than 50%  respiratory variability, suggesting right atrial pressure of 3 mmHg.    Echo 03/2021 1. Left ventricular ejection fraction, by estimation, is 60 to 65%. The  left ventricle has normal function. The left ventricle has no regional  wall motion abnormalities. Left ventricular diastolic parameters were  normal.   2. Right ventricular systolic function is normal. The right ventricular  size is normal.   3. The mitral valve is normal in structure. No evidence of mitral valve  regurgitation. No evidence of mitral stenosis.   4. The aortic valve is normal in  structure. Aortic valve regurgitation is  not visualized. No aortic stenosis is present.   5. The inferior vena cava is normal in size with greater than 50%  respiratory variability, suggesting right atrial pressure of 3 mmHg.   Comparison(s): LVEF 60-65.      Physical Exam VS:  BP 126/66 (BP Location: Left Arm, Patient Position: Sitting, Cuff Size: Normal)   Pulse 83   Ht 5' 2 (1.575 m)   Wt 151 lb 9.6 oz (68.8 kg)   SpO2 98%   BMI 27.73 kg/m        Wt Readings from Last 3 Encounters:  07/14/24 151 lb 9.6 oz (68.8 kg)  07/09/24 148 lb 8 oz (67.4 kg)  06/30/24 149 lb (67.6 kg)    GEN: Well nourished, well developed in no acute distress NECK: No JVD; No carotid bruits CARDIAC: RRR, no murmurs, rubs, gallops RESPIRATORY:  Clear to auscultation without rales, wheezing or rhonchi  ABDOMEN: Soft, non-tender, non-distended EXTREMITIES:  No edema; No deformity   ASSESSMENT AND PLAN  CAD Recent cardiac cath showed severe two-vessel CAD involving mid LAD and mid circumflex treated with DES placement x 2.  Patient was started on DAPT with aspirin  and Plavix  for minimum of 6 months.  Patient had bruising and a small hematoma post cath.  This is still present, but has improved.  Patient will start cardiac rehab.  Continue aspirin , Plavix , Toprol , atorvastatin .  HFimpEF Repeat echo showed normal pump function.  Patient is euvolemic on exam.  Continue Toprol .  HTN Blood pressure is normal.  Continue Toprol  37.5 mg daily.  Hyperlipidemia She was recently started on Lipitor.  Lipid panel today. 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 Continue atorvastatin  40 mg daily.  Can repeat fasting lipid and LFTs at follow-up.  pSVT Patient denies palpitations. Continue beta-blocker therapy  Multiple Myeloma Most recent hemoglobin 10.6, which is stable.  She has chronically low platelets, most recent 1 24K.  She gets labs every month.  Foley cath Patient is deferring bladder lift at this time.      Dispo: Follow-up in  2 months  Signed, Jean Acri VEAR Fishman, PA-C   "

## 2024-07-14 NOTE — Patient Instructions (Signed)
 Medication Instructions:  Your physician recommends that you continue on your current medications as directed. Please refer to the Current Medication list given to you today.    Lab Work: Your provider would like for you to have following labs drawn today Lipid, direct LDL, A1C.     Testing/Procedures: No test ordered today   Follow-Up: At [facility], you and your health needs are our priority.  As part of our continuing mission to provide you with exceptional heart care, our providers are all part of one team.  This team includes your primary Cardiologist (physician) and Advanced Practice Providers or APPs (Physician Assistants and Nurse Practitioners) who all work together to provide you with the care you need, when you need it.  Your next appointment:   2 month(s)  Provider:   Deatrice Cage, MD or Cadence Franchester, PA-C

## 2024-07-15 ENCOUNTER — Other Ambulatory Visit: Payer: Self-pay

## 2024-07-15 LAB — LIPID PANEL
Chol/HDL Ratio: 2.8 ratio (ref 0.0–4.4)
Cholesterol, Total: 130 mg/dL (ref 100–199)
HDL: 46 mg/dL
LDL Chol Calc (NIH): 61 mg/dL (ref 0–99)
Triglycerides: 134 mg/dL (ref 0–149)
VLDL Cholesterol Cal: 23 mg/dL (ref 5–40)

## 2024-07-15 LAB — HEMOGLOBIN A1C
Est. average glucose Bld gHb Est-mCnc: 85 mg/dL
Hgb A1c MFr Bld: 4.6 % — ABNORMAL LOW (ref 4.8–5.6)

## 2024-07-15 LAB — LDL CHOLESTEROL, DIRECT: LDL Direct: 58 mg/dL (ref 0–99)

## 2024-07-15 LAB — HELIX PHARMACOGENOMICS (PGX) CLOPIDOGREL TEST

## 2024-07-17 ENCOUNTER — Encounter: Payer: Self-pay | Admitting: Emergency Medicine

## 2024-07-17 ENCOUNTER — Ambulatory Visit: Payer: Self-pay | Admitting: Medical

## 2024-07-17 ENCOUNTER — Encounter: Attending: Cardiovascular Disease

## 2024-07-17 ENCOUNTER — Other Ambulatory Visit: Payer: Self-pay

## 2024-07-17 VITALS — Ht 63.0 in | Wt 151.2 lb

## 2024-07-17 DIAGNOSIS — Z955 Presence of coronary angioplasty implant and graft: Secondary | ICD-10-CM | POA: Insufficient documentation

## 2024-07-17 NOTE — Progress Notes (Signed)
 Completed program orientation and . Initial ITP created and sent for review to Medical Director.

## 2024-07-17 NOTE — Telephone Encounter (Signed)
 Called pt and pt stated that she is not taking furosemide  anymore. I advised the pt that if she has any other problems, questions or concerns to give our office a call. Pt verbalized understanding.

## 2024-07-17 NOTE — Patient Instructions (Signed)
 Patient Instructions  Patient Details  Name: Jean Davidson MRN: [id number] Date of Birth: 02-09-1946 Referring Provider:  Darron Deatrice LABOR, MD  Below are your personal goals for exercise, nutrition, and risk factors. Our goal is to help you stay on track towards obtaining and maintaining these goals. We will be discussing your progress on these goals with you throughout the program.  Initial Exercise Prescription:  Initial Exercise Prescription - 07/17/24 1600       Date of Initial Exercise RX and Referring Provider   Date 07/17/24    Referring Provider Dr. Deatrice Darron      Oxygen    Maintain Oxygen  Saturation 88% or higher      Recumbant Bike   Level 2    RPM 50    Watts 25    Minutes 15    METs 1.84      NuStep   Level 2    SPM 80    Minutes 15    METs 1.84      T5 Nustep   Level 2    SPM 80    Minutes 15    METs 1.84      Biostep-RELP   Level 1    SPM 50    Minutes 15    METs 1.84      Track   Laps 23    Minutes 15    METs 2.25      Prescription Details   Duration Progress to 30 minutes of continuous aerobic without signs/symptoms of physical distress      Intensity   THRR 40-80% of Max Heartrate 111-131    Ratings of Perceived Exertion 11-13    Perceived Dyspnea 0-4      Progression   Progression Continue to progress workloads to maintain intensity without signs/symptoms of physical distress.      Resistance Training   Training Prescription Yes    Weight 3lb    Reps 10-15          Exercise Goals: Frequency: Be able to perform aerobic exercise two to three times per week in program working toward 2-5 days per week of home exercise.  Intensity: Work with a perceived exertion of 11 (fairly light) - 15 (hard) while following your exercise prescription.  We will make changes to your prescription with you as you progress through the program.   Duration: Be able to do 30 to 45 minutes of continuous aerobic exercise in addition to a 5 minute  warm-up and a 5 minute cool-down routine.   Nutrition Goals: Your personal nutrition goals will be established when you do your nutrition analysis with the dietician.  The following are general nutrition guidelines to follow: Cholesterol < 200mg /day Sodium < 1500mg /day Fiber: Women over 50 yrs - 21 grams per day  Personal Goals:  Personal Goals and Risk Factors at Admission - 07/17/24 1543       Core Components/Risk Factors/Patient Goals on Admission    Weight Management Yes;Obesity;Weight Loss;Weight Maintenance    Intervention Obesity: Provide education and appropriate resources to help participant work on and attain dietary goals.;Weight Management/Obesity: Establish reasonable short term and long term weight goals.;Weight Management: Provide education and appropriate resources to help participant work on and attain dietary goals.;Weight Management: Develop a combined nutrition and exercise program designed to reach desired caloric intake, while maintaining appropriate intake of nutrient and fiber, sodium and fats, and appropriate energy expenditure required for the weight goal.    Goal Weight: Long Term 145 lb (  65.8 kg)    Expected Outcomes Short Term: Continue to assess and modify interventions until short term weight is achieved;Long Term: Adherence to nutrition and physical activity/exercise program aimed toward attainment of established weight goal;Weight Loss: Understanding of general recommendations for a balanced deficit meal plan, which promotes 1-2 lb weight loss per week and includes a negative energy balance of [phone] kcal/d;Understanding recommendations for meals to include 15-35% energy as protein, 25-35% energy from fat, 35-60% energy from carbohydrates, less than 200mg  of dietary cholesterol, 20-35 gm of total fiber daily;Understanding of distribution of calorie intake throughout the day with the consumption of 4-5 meals/snacks;Weight Maintenance: Understanding of the daily  nutrition guidelines, which includes 25-35% calories from fat, 7% or less cal from saturated fats, less than 200mg  cholesterol, less than 1.5gm of sodium, & 5 or more servings of fruits and vegetables daily    Hypertension Yes    Intervention Provide education on lifestyle modifcations including regular physical activity/exercise, weight management, moderate sodium restriction and increased consumption of fresh fruit, vegetables, and low fat dairy, alcohol  moderation, and smoking cessation.;Monitor prescription use compliance.    Expected Outcomes Short Term: Continued assessment and intervention until BP is < 140/53mm HG in hypertensive participants. < 130/25mm HG in hypertensive participants with diabetes, heart failure or chronic kidney disease.;Long Term: Maintenance of blood pressure at goal levels.    Lipids Yes    Intervention Provide education and support for participant on nutrition & aerobic/resistive exercise along with prescribed medications to achieve LDL 70mg , HDL >40mg .    Expected Outcomes Short Term: Participant states understanding of desired cholesterol values and is compliant with medications prescribed. Participant is following exercise prescription and nutrition guidelines.;Long Term: Cholesterol controlled with medications as prescribed, with individualized exercise RX and with personalized nutrition plan. Value goals: LDL < 70mg , HDL > 40 mg.         Exercise Goals and Review:  Exercise Goals     Row Name 07/17/24 1651             Exercise Goals   Increase Physical Activity Yes       Intervention Provide advice, education, support and counseling about physical activity/exercise needs.;Develop an individualized exercise prescription for aerobic and resistive training based on initial evaluation findings, risk stratification, comorbidities and participant's personal goals.       Expected Outcomes Short Term: Attend rehab on a regular basis to increase amount of physical  activity.;Long Term: Exercising regularly at least 3-5 days a week.;Long Term: Add in home exercise to make exercise part of routine and to increase amount of physical activity.       Increase Strength and Stamina Yes       Intervention Provide advice, education, support and counseling about physical activity/exercise needs.;Develop an individualized exercise prescription for aerobic and resistive training based on initial evaluation findings, risk stratification, comorbidities and participant's personal goals.       Expected Outcomes Short Term: Increase workloads from initial exercise prescription for resistance, speed, and METs.;Short Term: Perform resistance training exercises routinely during rehab and add in resistance training at home;Long Term: Improve cardiorespiratory fitness, muscular endurance and strength as measured by increased METs and functional capacity ( )       Able to understand and use rate of perceived exertion (RPE) scale Yes       Intervention Provide education and explanation on how to use RPE scale       Expected Outcomes Short Term: Able to use RPE daily in rehab to  express subjective intensity level;Long Term:  Able to use RPE to guide intensity level when exercising independently       Able to understand and use Dyspnea scale Yes       Intervention Provide education and explanation on how to use Dyspnea scale       Expected Outcomes Short Term: Able to use Dyspnea scale daily in rehab to express subjective sense of shortness of breath during exertion;Long Term: Able to use Dyspnea scale to guide intensity level when exercising independently       Knowledge and understanding of Target Heart Rate Range (THRR) Yes       Intervention Provide education and explanation of THRR including how the numbers were predicted and where they are located for reference       Expected Outcomes Short Term: Able to state/look up THRR;Short Term: Able to use daily as guideline for intensity in  rehab;Long Term: Able to use THRR to govern intensity when exercising independently       Able to check pulse independently Yes       Intervention Provide education and demonstration on how to check pulse in carotid and radial arteries.;Review the importance of being able to check your own pulse for safety during independent exercise       Expected Outcomes Long Term: Able to check pulse independently and accurately;Short Term: Able to explain why pulse checking is important during independent exercise       Understanding of Exercise Prescription Yes       Intervention Provide education, explanation, and written materials on patient's individual exercise prescription       Expected Outcomes Short Term: Able to explain program exercise prescription;Long Term: Able to explain home exercise prescription to exercise independently          Copy of goals given to participant.

## 2024-07-17 NOTE — Progress Notes (Signed)
 Cardiac Individual Treatment Plan  Patient Details  Name: Jean Davidson MRN: [id number] Date of Birth: 12-22-1945 Referring Provider:   Flowsheet Row Cardiac Rehab from 07/17/2024 in [facility] Cardiac and Pulmonary Rehab  Referring Provider Dr. 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   Osteopenia    Spinal stenosis in cervical region    Varicose veins of lower extremities with inflammation     Tobacco Use: Tobacco Use History[2]  Labs: Review Flowsheet       Latest Ref Rng & Units 08/08/2013 03/08/2024 03/10/2024 07/14/2024  Labs for ITP Cardiac and Pulmonary Rehab  Cholestrol 100 - 199 mg/dL 889  - - 869   LDL (calc) 0 - 99 mg/dL 57  - - 61   Direct LDL 0 - 99 mg/dL - - - 58   HDL-C >60 mg/dL 63.49  - - 46   Trlycerides 0 - 149 mg/dL 17.9  - - 865   Hemoglobin A1c 4.8 - 5.6 % 5.2  - 4.7  4.6   TCO2 22 - 32 mmol/L - 25  - -     Exercise Target Goals: Exercise Program Goal: Individual exercise prescription set using results from initial 6 min walk test and THRR while considering  patients activity barriers and safety.   Exercise Prescription Goal: Initial exercise prescription builds to 30-45  minutes a day of aerobic activity, 2-3 days per week.  Home exercise guidelines will be given to patient during program as part of exercise prescription that the participant will acknowledge.   Education: Aerobic Exercise: - Group verbal and visual presentation on the components of exercise prescription. Introduces F.I.T.T principle from ACSM for exercise prescriptions.  Reviews F.I.T.T. principles of aerobic exercise including progression. Written material provided at class time.   Education: Resistance Exercise: - Group verbal and visual presentation on the components of exercise prescription. Introduces F.I.T.T principle from ACSM for exercise prescriptions  Reviews F.I.T.T. principles of resistance exercise including progression. Written material provided at class time.    Education: Exercise & Equipment Safety: - Individual verbal instruction and demonstration of equipment use and safety with use of the equipment. Flowsheet Row Cardiac Rehab from 07/17/2024 in [facility] Cardiac and Pulmonary Rehab  Date 07/17/24  Educator [facility]  Instruction Review Code 1- Verbalizes Understanding    Education: Exercise Physiology & General Exercise Guidelines: - Group verbal and written instruction with models to review the exercise physiology of the cardiovascular system and associated critical values. Provides general exercise guidelines with specific guidelines to those with heart or lung disease. Written material provided at class time. Flowsheet Row Cardiac Rehab from 07/17/2024  in [facility] Cardiac and Pulmonary Rehab  Education need identified 07/17/24    Education: Flexibility, Balance, Mind/Body Relaxation: - Group verbal and visual presentation with interactive activity on the components of exercise prescription. Introduces F.I.T.T principle from ACSM for exercise prescriptions. Reviews F.I.T.T. principles of flexibility and balance exercise training including progression. Also discusses the mind body  connection.  Reviews various relaxation techniques to help reduce and manage stress (i.e. Deep breathing, progressive muscle relaxation, and visualization). Balance handout provided to take home. Written material provided at class time.   Activity Barriers & Risk Stratification:  Activity Barriers & Cardiac Risk Stratification - 07/17/24 1444       Activity Barriers & Cardiac Risk Stratification   Activity Barriers Other (comment);Balance Concerns    Comments right shoulder surgery 2015    Cardiac Risk Stratification Moderate          6 Minute Walk:  6 Minute Walk     Row Name 07/17/24 1647         6 Minute Walk   Phase Initial     Distance 880 feet     Walk Time 6 minutes     # of Rest Breaks 0     MPH 1.66     METS 1.84     RPE 11     Perceived Dyspnea  0     VO2 Peak 6.4     Symptoms Yes (comment)     Comments lower back pain 2/10     Resting HR 91 bpm     Resting BP 124/62     Resting Oxygen  Saturation  96 %     Exercise Oxygen  Saturation  during 6 min walk 96 %     Max Ex. HR 118 bpm     Max Ex. BP 128/60     2 Minute Post BP 122/60        Oxygen  Initial Assessment:   Oxygen  Re-Evaluation:   Oxygen  Discharge (Final Oxygen  Re-Evaluation):   Initial Exercise Prescription:  Initial Exercise Prescription - 07/17/24 1600       Date of Initial Exercise RX and Referring Provider   Date 07/17/24    Referring Provider Dr. Deatrice Cage      Oxygen    Maintain Oxygen  Saturation 88% or higher      Recumbant Bike   Level 2    RPM 50    Watts 25    Minutes 15    METs 1.84      NuStep   Level 2    SPM 80    Minutes 15    METs 1.84      T5 Nustep   Level 2    SPM 80    Minutes 15    METs 1.84      Biostep-RELP   Level 1    SPM 50    Minutes 15    METs 1.84      Track   Laps 23    Minutes 15    METs 2.25      Prescription Details   Duration Progress to 30 minutes of continuous aerobic without signs/symptoms of physical distress       Intensity   THRR 40-80% of Max Heartrate 111-131    Ratings of Perceived Exertion 11-13    Perceived Dyspnea 0-4      Progression   Progression Continue to progress workloads to maintain intensity without signs/symptoms of physical distress.      Resistance Training  Training Prescription Yes    Weight 3lb    Reps 10-15          Perform Capillary Blood Glucose checks as needed.  Exercise Prescription Changes:   Exercise Prescription Changes     Row Name 07/17/24 1600             Response to Exercise   Blood Pressure (Admit) 124/62       Blood Pressure (Exercise) 128/60       Blood Pressure (Exit) 122/60       Heart Rate (Admit) 91 bpm       Heart Rate (Exercise) 118 bpm       Heart Rate (Exit) 94 bpm       Oxygen  Saturation (Admit) 96 %       Oxygen  Saturation (Exercise) 96 %       Oxygen  Saturation (Exit) 96 %       Rating of Perceived Exertion (Exercise) 11       Perceived Dyspnea (Exercise) 0       Symptoms lower back pain 2/10       Comments results          Exercise Comments:   Exercise Goals and Review:   Exercise Goals     Row Name 07/17/24 1651             Exercise Goals   Increase Physical Activity Yes       Intervention Provide advice, education, support and counseling about physical activity/exercise needs.;Develop an individualized exercise prescription for aerobic and resistive training based on initial evaluation findings, risk stratification, comorbidities and participant's personal goals.       Expected Outcomes Short Term: Attend rehab on a regular basis to increase amount of physical activity.;Long Term: Exercising regularly at least 3-5 days a week.;Long Term: Add in home exercise to make exercise part of routine and to increase amount of physical activity.       Increase Strength and Stamina Yes       Intervention Provide advice, education, support and counseling about physical activity/exercise needs.;Develop an  individualized exercise prescription for aerobic and resistive training based on initial evaluation findings, risk stratification, comorbidities and participant's personal goals.       Expected Outcomes Short Term: Increase workloads from initial exercise prescription for resistance, speed, and METs.;Short Term: Perform resistance training exercises routinely during rehab and add in resistance training at home;Long Term: Improve cardiorespiratory fitness, muscular endurance and strength as measured by increased METs and functional capacity ( )       Able to understand and use rate of perceived exertion (RPE) scale Yes       Intervention Provide education and explanation on how to use RPE scale       Expected Outcomes Short Term: Able to use RPE daily in rehab to express subjective intensity level;Long Term:  Able to use RPE to guide intensity level when exercising independently       Able to understand and use Dyspnea scale Yes       Intervention Provide education and explanation on how to use Dyspnea scale       Expected Outcomes Short Term: Able to use Dyspnea scale daily in rehab to express subjective sense of shortness of breath during exertion;Long Term: Able to use Dyspnea scale to guide intensity level when exercising independently       Knowledge and understanding of Target Heart Rate Range (THRR) Yes       Intervention Provide  education and explanation of THRR including how the numbers were predicted and where they are located for reference       Expected Outcomes Short Term: Able to state/look up THRR;Short Term: Able to use daily as guideline for intensity in rehab;Long Term: Able to use THRR to govern intensity when exercising independently       Able to check pulse independently Yes       Intervention Provide education and demonstration on how to check pulse in carotid and radial arteries.;Review the importance of being able to check your own pulse for safety during independent exercise        Expected Outcomes Long Term: Able to check pulse independently and accurately;Short Term: Able to explain why pulse checking is important during independent exercise       Understanding of Exercise Prescription Yes       Intervention Provide education, explanation, and written materials on patient's individual exercise prescription       Expected Outcomes Short Term: Able to explain program exercise prescription;Long Term: Able to explain home exercise prescription to exercise independently          Exercise Goals Re-Evaluation :   Discharge Exercise Prescription (Final Exercise Prescription Changes):  Exercise Prescription Changes - 07/17/24 1600       Response to Exercise   Blood Pressure (Admit) 124/62    Blood Pressure (Exercise) 128/60    Blood Pressure (Exit) 122/60    Heart Rate (Admit) 91 bpm    Heart Rate (Exercise) 118 bpm    Heart Rate (Exit) 94 bpm    Oxygen  Saturation (Admit) 96 %    Oxygen  Saturation (Exercise) 96 %    Oxygen  Saturation (Exit) 96 %    Rating of Perceived Exertion (Exercise) 11    Perceived Dyspnea (Exercise) 0    Symptoms lower back pain 2/10    Comments results          Nutrition:  Target Goals: Understanding of nutrition guidelines, daily intake of sodium 1500mg , cholesterol 200mg , calories 30% from fat and 7% or less from saturated fats, daily to have 5 or more servings of fruits and vegetables.  Education: Nutrition 1 -Group instruction provided by verbal, written material, interactive activities, discussions, models, and posters to present general guidelines for heart healthy nutrition including macronutrients, label reading, and promoting whole foods over processed counterparts. Education serves as pensions consultant of discussion of heart healthy eating for all. Written material provided at class time.    Education: Nutrition 2 -Group instruction provided by verbal, written material, interactive activities, discussions, models, and  posters to present general guidelines for heart healthy nutrition including sodium, cholesterol, and saturated fat. Providing guidance of habit forming to improve blood pressure, cholesterol, and body weight. Written material provided at class time.     Biometrics:  Pre Biometrics - 07/17/24 1652       Pre Biometrics   Height 5' 3 (1.6 m)    Weight 151 lb 3.2 oz (68.6 kg)    Waist Circumference 40 inches    Hip Circumference 40.5 inches    Waist to Hip Ratio 0.99 %    BMI (Calculated) 26.79    Single Leg Stand 4 seconds           Nutrition Therapy Plan and Nutrition Goals:   Nutrition Assessments:  MEDIFICTS Score Key: >=70 Need to make dietary changes  40-70 Heart Healthy Diet <= 40 Therapeutic Level Cholesterol Diet  Flowsheet Row Cardiac Rehab from 07/17/2024 in [facility]  Cardiac and Pulmonary Rehab  Picture Your Plate Total Score on Admission 52   Picture Your Plate Scores: <59 Unhealthy dietary pattern with much room for improvement. 41-50 Dietary pattern unlikely to meet recommendations for good health and room for improvement. 51-60 More healthful dietary pattern, with some room for improvement.  >60 Healthy dietary pattern, although there may be some specific behaviors that could be improved.    Nutrition Goals Re-Evaluation:   Nutrition Goals Discharge (Final Nutrition Goals Re-Evaluation):   Psychosocial: Target Goals: Acknowledge presence or absence of significant depression and/or stress, maximize coping skills, provide positive support system. Participant is able to verbalize types and ability to use techniques and skills needed for reducing stress and depression.   Education: Stress, Anxiety, and Depression - Group verbal and visual presentation to define topics covered.  Reviews how body is impacted by stress, anxiety, and depression.  Also discusses healthy ways to reduce stress and to treat/manage anxiety and depression. Written material provided at  class time.   Education: Sleep Hygiene -Provides group verbal and written instruction about how sleep can affect your health.  Define sleep hygiene, discuss sleep cycles and impact of sleep habits. Review good sleep hygiene tips.   Initial Review & Psychosocial Screening:  Initial Psych Review & Screening - 07/17/24 1456       Initial Review   Current issues with None Identified      Family Dynamics   Good Support System? Yes   3 good friends to help with transportation and companionship     Barriers   Psychosocial barriers to participate in program There are no identifiable barriers or psychosocial needs.;The patient should benefit from training in stress management and relaxation.      Screening Interventions   Interventions Encouraged to exercise;Provide feedback about the scores to participant    Expected Outcomes Short Term goal: Utilizing psychosocial counselor, staff and physician to assist with identification of specific Stressors or current issues interfering with healing process. Setting desired goal for each stressor or current issue identified.;Short Term goal: Identification and review with participant of any Quality of Life or Depression concerns found by scoring the questionnaire.;Long Term Goal: Stressors or current issues are controlled or eliminated.;Long Term goal: The participant improves quality of Life and PHQ9 Scores as seen by post scores and/or verbalization of changes          Quality of Life Scores:   Quality of Life - 07/17/24 1551       Quality of Life   Select Quality of Life      Quality of Life Scores   Health/Function Pre 23 %    Socioeconomic Pre 29.14 %    Psych/Spiritual Pre 22.5 %    Family Pre 30 %    GLOBAL Pre 25.36 %         Scores of 19 and below usually indicate a poorer quality of life in these areas.  A difference of  2-3 points is a clinically meaningful difference.  A difference of 2-3 points in the total score of the Quality  of Life Index has been associated with significant improvement in overall quality of life, self-image, physical symptoms, and general health in studies assessing change in quality of life.  PHQ-9: Review Flowsheet  More data exists      07/17/2024 06/04/2024 05/28/2024 05/14/2024 04/30/2024  Depression screen PHQ 2/9  Decreased Interest 0 0 0 0 0  Down, Depressed, Hopeless 0 0 0 - 0  PHQ - 2  Score 0 0 0 0 0  Altered sleeping 0 - - - -  Tired, decreased energy 0 - - - -  Change in appetite 0 - - - -  Feeling bad or failure about yourself  0 - - - -  Trouble concentrating 0 - - - -  Moving slowly or fidgety/restless 0 - - - -  Suicidal thoughts 0 - - - -  PHQ-9 Score 0 - - - -   Interpretation of Total Score  Total Score Depression Severity:  1-4 = Minimal depression, 5-9 = Mild depression, 10-14 = Moderate depression, 15-19 = Moderately severe depression, 20-27 = Severe depression   Psychosocial Evaluation and Intervention:  Psychosocial Evaluation - 07/17/24 1454       Psychosocial Evaluation & Interventions   Interventions Stress management education;Relaxation education;Encouraged to exercise with the program and follow exercise prescription    Comments Kenise is looking forward to starting the program. She states that she wants to increase her energy and stamina. She is currently in remission of multiple myeloma and states that she has not had much energy since her last round of treatments. Prior to that she was participating in [employer] at their highest level and she wants to be able to return to the [facility] in the future. She is also looking forward to the social interaction she will have in the program. She does report struggling with eating enough variety and states that her cancer treaments have dramatically changed her tastebuds.    Expected Outcomes Short: Attend cardiac rehab for education and exercise. Long: Develop and maintain positive self care habits.    Continue  Psychosocial Services  Follow up required by staff          Psychosocial Re-Evaluation:   Psychosocial Discharge (Final Psychosocial Re-Evaluation):   Vocational Rehabilitation: Provide vocational rehab assistance to qualifying candidates.   Vocational Rehab Evaluation & Intervention:  Vocational Rehab - 07/17/24 1545       Initial Vocational Rehab Evaluation & Intervention   Assessment shows need for Vocational Rehabilitation No   retired         Education: Education Goals: Education classes will be provided on a variety of topics geared toward better understanding of heart health and risk factor modification. Participant will state understanding/return demonstration of topics presented as noted by education test scores.  Learning Barriers/Preferences:  Learning Barriers/Preferences - 07/17/24 1545       Learning Barriers/Preferences   Learning Barriers None    Learning Preferences Computer/Internet;Verbal Instruction;Skilled Demonstration;Individual Instruction          General Cardiac Education Topics:  AED/CPR: - Group verbal and written instruction with the use of models to demonstrate the basic use of the AED with the basic ABC's of resuscitation.   Test and Procedures: - Group verbal and visual presentation and models provide information about basic cardiac anatomy and function. Reviews the testing methods done to diagnose heart disease and the outcomes of the test results. Describes the treatment choices: Medical Management, Angioplasty, or Coronary Bypass Surgery for treating various heart conditions including Myocardial Infarction, Angina, Valve Disease, and Cardiac Arrhythmias. Written material provided at class time. Flowsheet Row Cardiac Rehab from 07/17/2024 in [facility] Cardiac and Pulmonary Rehab  Education need identified 07/17/24    Medication Safety: - Group verbal and visual instruction to review commonly prescribed medications for heart and lung  disease. Reviews the medication, class of the drug, and side effects. Includes the steps to properly store meds and maintain  the prescription regimen. Written material provided at class time.   Intimacy: - Group verbal instruction through game format to discuss how heart and lung disease can affect sexual intimacy. Written material provided at class time.   Know Your Numbers and Heart Failure: - Group verbal and visual instruction to discuss disease risk factors for cardiac and pulmonary disease and treatment options.  Reviews associated critical values for Overweight/Obesity, Hypertension, Cholesterol, and Diabetes.  Discusses basics of heart failure: signs/symptoms and treatments.  Introduces Heart Failure Zone chart for action plan for heart failure. Written material provided at class time.   Infection Prevention: - Provides verbal and written material to individual with discussion of infection control including proper hand washing and proper equipment cleaning during exercise session. Flowsheet Row Cardiac Rehab from 07/17/2024 in [facility] Cardiac and Pulmonary Rehab  Date 07/17/24  Educator [facility]  Instruction Review Code 1- Verbalizes Understanding    Falls Prevention: - Provides verbal and written material to individual with discussion of falls prevention and safety. Flowsheet Row Cardiac Rehab from 07/17/2024 in [facility] Cardiac and Pulmonary Rehab  Date 07/17/24  Educator [facility]  Instruction Review Code 1- Verbalizes Understanding    Other: -Provides group and verbal instruction on various topics (see comments)   Knowledge Questionnaire Score:  Knowledge Questionnaire Score - 07/17/24 1552       Knowledge Questionnaire Score   Pre Score 21/26          Core Components/Risk Factors/Patient Goals at Admission:  Personal Goals and Risk Factors at Admission - 07/17/24 1543       Core Components/Risk Factors/Patient Goals on Admission    Weight Management Yes;Obesity;Weight  Loss;Weight Maintenance    Intervention Obesity: Provide education and appropriate resources to help participant work on and attain dietary goals.;Weight Management/Obesity: Establish reasonable short term and long term weight goals.;Weight Management: Provide education and appropriate resources to help participant work on and attain dietary goals.;Weight Management: Develop a combined nutrition and exercise program designed to reach desired caloric intake, while maintaining appropriate intake of nutrient and fiber, sodium and fats, and appropriate energy expenditure required for the weight goal.    Goal Weight: Long Term 145 lb (65.8 kg)    Expected Outcomes Short Term: Continue to assess and modify interventions until short term weight is achieved;Long Term: Adherence to nutrition and physical activity/exercise program aimed toward attainment of established weight goal;Weight Loss: Understanding of general recommendations for a balanced deficit meal plan, which promotes 1-2 lb weight loss per week and includes a negative energy balance of [phone] kcal/d;Understanding recommendations for meals to include 15-35% energy as protein, 25-35% energy from fat, 35-60% energy from carbohydrates, less than 200mg  of dietary cholesterol, 20-35 gm of total fiber daily;Understanding of distribution of calorie intake throughout the day with the consumption of 4-5 meals/snacks;Weight Maintenance: Understanding of the daily nutrition guidelines, which includes 25-35% calories from fat, 7% or less cal from saturated fats, less than 200mg  cholesterol, less than 1.5gm of sodium, & 5 or more servings of fruits and vegetables daily    Hypertension Yes    Intervention Provide education on lifestyle modifcations including regular physical activity/exercise, weight management, moderate sodium restriction and increased consumption of fresh fruit, vegetables, and low fat dairy, alcohol  moderation, and smoking cessation.;Monitor  prescription use compliance.    Expected Outcomes Short Term: Continued assessment and intervention until BP is < 140/40mm HG in hypertensive participants. < 130/3mm HG in hypertensive participants with diabetes, heart failure or chronic kidney disease.;Long Term: Maintenance of blood  pressure at goal levels.    Lipids Yes    Intervention Provide education and support for participant on nutrition & aerobic/resistive exercise along with prescribed medications to achieve LDL 70mg , HDL >40mg .    Expected Outcomes Short Term: Participant states understanding of desired cholesterol values and is compliant with medications prescribed. Participant is following exercise prescription and nutrition guidelines.;Long Term: Cholesterol controlled with medications as prescribed, with individualized exercise RX and with personalized nutrition plan. Value goals: LDL < 70mg , HDL > 40 mg.          Education:Diabetes - Individual verbal and written instruction to review signs/symptoms of diabetes, desired ranges of glucose level fasting, after meals and with exercise. Acknowledge that pre and post exercise glucose checks will be done for 3 sessions at entry of program.   Core Components/Risk Factors/Patient Goals Review:    Core Components/Risk Factors/Patient Goals at Discharge (Final Review):    ITP Comments:  ITP Comments     Row Name 07/17/24 1433           ITP Comments Completed program orientation and . Initial ITP created and sent for review to Medical Director.          Comments: Initial ITP    [1]  Current Outpatient Medications:    acetaminophen  (TYLENOL ) 500 MG tablet, Take 500 mg by mouth every 6 (six) hours as needed for moderate pain (pain score 4-6) or mild pain (pain score 1-3)., Disp: , Rfl:    acyclovir  (ZOVIRAX ) 400 MG tablet, Take 1 tablet (400 mg total) by mouth 2 (two) times daily., Disp: 180 tablet, Rfl: 1   Artificial Tear Ointment (DRY EYES OP), Place 1 drop into  both eyes daily as needed (dry eyes)., Disp: , Rfl:    aspirin  EC 81 MG tablet, Take 81 mg by mouth daily. Swallow whole., Disp: , Rfl:    atorvastatin  (LIPITOR) 40 MG tablet, Take 1 tablet (40 mg total) by mouth daily., Disp: 90 tablet, Rfl: 3   atovaquone  (MEPRON ) 750 MG/5ML suspension, TAKE 10 MLS (1,500 MG TOTAL) BY MOUTH 2 (TWO) TIMES DAILY., Disp: 420 mL, Rfl: 3   calcium  carbonate (TUMS - DOSED IN MG ELEMENTAL CALCIUM ) 500 MG chewable tablet, Chew 1 tablet by mouth daily as needed for indigestion or heartburn., Disp: , Rfl:    cholecalciferol  (VITAMIN D3) 25 MCG (1000 UNIT) tablet, Take 1,000 Units by mouth daily. Instructed to increase to 4,000 units daily, Disp: , Rfl:    docusate sodium  (COLACE) 50 MG capsule, Take 50 mg by mouth 2 (two) times daily. (Patient taking differently: Take 50 mg by mouth daily as needed for mild constipation.), Disp: , Rfl:    metoprolol  succinate (TOPROL -XL) 25 MG 24 hr tablet, Take 1.5 tablets (37.5 mg total) by mouth daily., Disp: 135 tablet, Rfl: 3   mirtazapine  (REMERON ) 15 MG tablet, TAKE 1 TABLET BY MOUTH EVERYDAY AT BEDTIME, Disp: 90 tablet, Rfl: 1   Multiple Vitamins-Minerals (PRESERVISION AREDS 2 PO), Take 1 tablet by mouth 2 (two) times daily., Disp: , Rfl:    ondansetron  (ZOFRAN ) 8 MG tablet, Take 1 tablet (8 mg total) by mouth every 8 (eight) hours as needed (Nausea or vomiting)., Disp: 60 tablet, Rfl: 1   pantoprazole  (PROTONIX ) 40 MG tablet, TAKE 1 TABLET BY MOUTH 2 TIMES DAILY. TAKE 30-60 MINUTES BEFORE BREAKFAST AND DINNER., Disp: 180 tablet, Rfl: 0   prochlorperazine  (COMPAZINE ) 5 MG tablet, Take 5 mg by mouth every 6 (six) hours as needed for nausea or  vomiting., Disp: , Rfl:    clopidogrel  (PLAVIX ) 75 MG tablet, Take 1 tablet (75 mg total) by mouth daily with breakfast., Disp: 90 tablet, Rfl: 3 [2]  Social History Tobacco Use  Smoking Status Former   Current packs/day: 0.00   Average packs/day: 0.5 packs/day for 4.0 years (2.0 ttl pk-yrs)    Types: Cigarettes   Start date: 06/26/1964   Quit date: 06/26/1968   Years since quitting: 56.0  Smokeless Tobacco Never

## 2024-07-18 ENCOUNTER — Other Ambulatory Visit: Payer: Self-pay

## 2024-07-22 ENCOUNTER — Other Ambulatory Visit: Payer: Self-pay | Admitting: Hematology and Oncology

## 2024-07-22 DIAGNOSIS — C9001 Multiple myeloma in remission: Secondary | ICD-10-CM

## 2024-07-22 NOTE — Progress Notes (Unsigned)
 " [facility] [phone]   [phone]  PROGRESS NOTE  Patient Care Team: Patient, [name] as PCP - General (General Practice) Darron Deatrice LABOR, MD as PCP - Cardiology (Cardiology) Darren Liborio Backers, MD as Referring Physician (Hematology and Oncology) Guinevere File, MD as Consulting Physician (Internal Medicine) Fleeta Rothman, Jomarie SAILOR, MD as Consulting Physician (Infectious Diseases) Marda Maranda Foots An, MD as Consulting Physician (Hematology and Oncology) Honora Lenon Irving, NP as Nurse Practitioner (Hematology and Oncology) Federico Norleen ONEIDA MADISON, MD as Consulting Physician (Hematology and Oncology) Duke, Jon Garre, [place] as Physician Assistant (Cardiology)  Hematological/Oncological History # Relapsed/Refractory IgA Lambda Multiple Myeloma, In Remission 2016: Underwent autologous stem cell transplant 2017: Underwent allogenic stem cell transplant 10/16/2022: Completed CAR-T therapy with [facility] 09/2023: Started on Elrexfio  due to progression of disease 01/02/2024: Last visit at [facility] where induction therapy was completed 01/09/2024: establish care with Dr. Federico.   Interval History:  Jean Davidson 79 y.o. female with medical history significant for relapsed/refractory multiple myeloma who presents for a follow up visit. The patient's last visit was on 07/09/2024. In the interim since the last visit she has had no major changes in her health.  [name] reports she is looking forward to starting cardiac rehab next week.  She reports that she does currently have her Foley catheter in place.  She was considered for a hysterectomy and surgery but this is being placed on hold because she is on blood thinners due to her cardiac disease.  She reports that she has had no issues with fevers, chills, sweats, nausea, vomiting or diarrhea.  She is tolerating her Elrexfio  well with no major side effects or symptoms.  She is willing and  able to proceed with treatment today.  She has avoided any infectious symptoms such as runny nose, sore throat, cough.  Full 10 point ROS is otherwise negative.   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   Osteopenia    Spinal stenosis in cervical region    Varicose veins of lower extremities with inflammation     SURGICAL HISTORY: Past Surgical History:  Procedure Laterality Date   ANKLE SURGERY Left    BLADDER SUSPENSION  1988/1989   x2   COLONOSCOPY     CORONARY STENT INTERVENTION N/A 06/30/2024   Procedure: CORONARY STENT INTERVENTION;  Surgeon: Darron Deatrice LABOR, MD;  Location: [facility] INVASIVE CV LAB;  Service: Cardiovascular;  Laterality: N/A;   ESOPHAGOGASTRODUODENOSCOPY     IR IMAGING GUIDED PORT INSERTION  03/01/2018   LEFT HEART CATH AND CORONARY ANGIOGRAPHY Left 06/30/2024   Procedure: LEFT HEART CATH AND CORONARY ANGIOGRAPHY;  Surgeon: Darron Deatrice LABOR, MD;  Location: [facility] INVASIVE CV LAB;  Service: Cardiovascular;  Laterality: Left;   LIMBAL STEM CELL TRANSPLANT     x2   PORT-A-CATH REMOVAL  SHOULDER SURGERY Right 04/06/2013   right   UPPER GASTROINTESTINAL ENDOSCOPY      SOCIAL HISTORY: Social History   Socioeconomic History   Marital status: Divorced    Spouse name: Not on file   Number of children: Not on file   Years of education: Not on file   Highest education level: Not on file  Occupational History   Not on file  Tobacco Use   Smoking  status: Former    Current packs/day: 0.00    Average packs/day: 0.5 packs/day for 4.0 years (2.0 ttl pk-yrs)    Types: Cigarettes    Start date: 06/26/1964    Quit date: 06/26/1968    Years since quitting: 56.1   Smokeless tobacco: Never  Vaping Use   Vaping status: Never Used  Substance and Sexual Activity   Alcohol  use: Yes    Alcohol /week: 1.0 standard drink of alcohol     Types: 1 Cans of beer per week    Comment: 1 per week per pt   Drug use: No   Sexual activity: Not on file  Other Topics Concern   Not on file  Social History Narrative   Jean Davidson is daughter sunshyne, horvath will,  full code ( reviewed 90)   Social Drivers of Health   Tobacco Use: Medium Risk (07/17/2024)   Patient History    Smoking Tobacco Use: Former    Smokeless Tobacco Use: Never    Passive Exposure: Not on file  Financial Resource Strain: Low Risk  (10/09/2023)   Received from [facility]   Overall Financial Resource Strain (CARDIA)    Difficulty of Paying Living Expenses: Not hard at all  Food Insecurity: No Food Insecurity (07/01/2024)   Epic    Worried About Radiation Protection Practitioner of Food in the Last Year: Never true    Ran Out of Food in the Last Year: Never true  Transportation Needs: No Transportation Needs (07/01/2024)   Epic    Lack of Transportation (Medical): No    Lack of Transportation (Non-Medical): No  Physical Activity: Not on file  Stress: Not on file  Social Connections: Moderately Integrated (07/01/2024)   Social Connection and Isolation Panel    Frequency of Communication with Friends and Family: More than three times a week    Frequency of Social Gatherings with Friends and Family: Twice a week    Attends Religious Services: 1 to 4 times per year    Active Member of [employer] or Organizations: Yes    Attends Banker Meetings: 1 to 4 times per year    Marital Status: Divorced  Intimate Partner Violence: Not At Risk (07/01/2024)   Epic    Fear of Current or  Ex-Partner: No    Emotionally Abused: No    Physically Abused: No    Sexually Abused: No  Depression (PHQ2-9): Low Risk (07/23/2024)   Depression (PHQ2-9)    PHQ-2 Score: 0  Alcohol  Screen: Not on file  Housing: Low Risk (07/01/2024)   Epic    Unable to Pay for Housing in the Last Year: No    Number of Times Moved in the Last Year: 0    Homeless in the Last Year: No  Utilities: Not At Risk (07/01/2024)   Epic    Threatened with loss of utilities: No  Health Literacy: Not on file    FAMILY HISTORY: Family History  Problem Relation Age of Onset   Heart disease Mother    Heart failure Father  Heart disease Father    Throat cancer Father    Skin cancer Father    Diabetes Father    Kidney disease Father    Hypertension Brother    Heart disease Brother        congenital shunt, ?    Colon cancer Paternal Aunt 83   Skin cancer Brother    Stomach cancer Neg Hx    Esophageal cancer Neg Hx    Rectal cancer Neg Hx     ALLERGIES:  has no known allergies.  MEDICATIONS:  Current Outpatient Medications  Medication Sig Dispense Refill   acetaminophen  (TYLENOL ) 500 MG tablet Take 500 mg by mouth every 6 (six) hours as needed for moderate pain (pain score 4-6) or mild pain (pain score 1-3).     acyclovir  (ZOVIRAX ) 400 MG tablet Take 1 tablet (400 mg total) by mouth 2 (two) times daily. 180 tablet 1   Artificial Tear Ointment (DRY EYES OP) Place 1 drop into both eyes daily as needed (dry eyes).     aspirin  EC 81 MG tablet Take 81 mg by mouth daily. Swallow whole.     atorvastatin  (LIPITOR) 40 MG tablet Take 1 tablet (40 mg total) by mouth daily. 90 tablet 3   atovaquone  (MEPRON ) 750 MG/5ML suspension TAKE 10 MLS (1,500 MG TOTAL) BY MOUTH 2 (TWO) TIMES DAILY. 420 mL 3   calcium  carbonate (TUMS - DOSED IN MG ELEMENTAL CALCIUM ) 500 MG chewable tablet Chew 1 tablet by mouth daily as needed for indigestion or heartburn.     cholecalciferol  (VITAMIN D3) 25 MCG (1000 UNIT) tablet Take 1,000  Units by mouth daily. Instructed to increase to 4,000 units daily     clopidogrel  (PLAVIX ) 75 MG tablet Take 1 tablet (75 mg total) by mouth daily with breakfast. 90 tablet 3   docusate sodium  (COLACE) 50 MG capsule Take 50 mg by mouth 2 (two) times daily. (Patient taking differently: Take 50 mg by mouth daily as needed for mild constipation.)     metoprolol  succinate (TOPROL -XL) 25 MG 24 hr tablet Take 1.5 tablets (37.5 mg total) by mouth daily. 135 tablet 3   mirtazapine  (REMERON ) 15 MG tablet TAKE 1 TABLET BY MOUTH EVERYDAY AT BEDTIME 90 tablet 1   Multiple Vitamins-Minerals (PRESERVISION AREDS 2 PO) Take 1 tablet by mouth 2 (two) times daily.     ondansetron  (ZOFRAN ) 8 MG tablet Take 1 tablet (8 mg total) by mouth every 8 (eight) hours as needed (Nausea or vomiting). 60 tablet 1   pantoprazole  (PROTONIX ) 40 MG tablet TAKE 1 TABLET BY MOUTH 2 TIMES DAILY. TAKE 30-60 MINUTES BEFORE BREAKFAST AND DINNER. 180 tablet 0   prochlorperazine  (COMPAZINE ) 5 MG tablet Take 5 mg by mouth every 6 (six) hours as needed for nausea or vomiting.     No current facility-administered medications for this visit.   Facility-Administered Medications Ordered in Other Visits  Medication Dose Route Frequency Provider Last Rate Last Admin   elranatamab -bcmm (ELREXFIO ) chemo SQ injection 76 mg  76 mg Subcutaneous Once Federico Norleen ONEIDA MADISON, MD        REVIEW OF SYSTEMS:   Constitutional: ( - ) fevers, ( - )  chills , ( - ) night sweats Eyes: ( - ) blurriness of vision, ( - ) double vision, ( - ) watery eyes Ears, nose, mouth, throat, and face: ( - ) mucositis, ( - ) sore throat Respiratory: (+ ) cough, ( - ) dyspnea, ( - ) wheezes Cardiovascular: ( - )  palpitation, ( - ) chest discomfort, ( - ) lower extremity swelling Gastrointestinal:  ( +) nausea, ( - ) heartburn, ( - ) change in bowel habits Skin: ( - ) abnormal skin rashes Lymphatics: ( - ) new lymphadenopathy, ( - ) easy bruising Neurological: ( - ) numbness, ( -  ) tingling, ( - ) new weaknesses Behavioral/Psych: ( - ) mood change, ( - ) new changes  All other systems were reviewed with the patient and are negative.  PHYSICAL EXAMINATION: ECOG PERFORMANCE STATUS: 1 - Symptomatic but completely ambulatory  Vitals:   07/23/24 0844 07/23/24 0846  BP: (!) 146/75 122/74  Pulse: 85   Resp: 16   Temp: 98 F (36.7 C)   SpO2: 98%       Filed Weights   07/23/24 0844  Weight: 151 lb 4.8 oz (68.6 kg)    Day 15, Cycle 9 07/09/24  Weight 148 lb 8 oz (67.4 kg)  Temp 98.4 F (36.9 C)  Temp src Oral  Pulse 82  Resp 18  BP 140/72 !    PHYSICAL EXAM:  ECOG PERFORMANCE STATUS: 1 - Symptomatic but completely ambulatory  GENERAL: Well-appearing elderly Caucasian female, alert, no distress and comfortable SKIN: skin color, texture, turgor are normal, no rashes or significant lesions EYES: conjunctiva are pink and non-injected, sclera clear LUNGS:normal breathing effort. No wheezes or rhonchi.  HEART: regular rate & rhythm and no murmurs and no lower extremity edema Musculoskeletal: no cyanosis of digits and no clubbing  PSYCH: alert & oriented x 3, fluent speech NEURO: no focal motor/sensory deficits   LABORATORY DATA:  I have reviewed the data as listed    Latest Ref Rng & Units 07/23/2024    8:22 AM 07/09/2024    8:26 AM 07/01/2024    4:17 AM  CBC  WBC 4.0 - 10.5 K/uL 2.8  2.9  2.7   Hemoglobin 12.0 - 15.0 g/dL 89.3  89.3  9.4   Hematocrit 36.0 - 46.0 % 32.2  32.0  29.4   Platelets 150 - 400 K/uL 127  124  107        Latest Ref Rng & Units 07/23/2024    8:22 AM 07/09/2024    8:26 AM 07/01/2024    4:17 AM  CMP  Glucose 70 - 99 mg/dL 887  873  92   BUN 8 - 23 mg/dL 17  14  17    Creatinine 0.44 - 1.00 mg/dL 9.18  9.14  9.21   Sodium 135 - 145 mmol/L 142  142  138   Potassium 3.5 - 5.1 mmol/L 3.7  3.6  3.8   Chloride 98 - 111 mmol/L 106  104  104   CO2 22 - 32 mmol/L 24  23  23    Calcium  8.9 - 10.3 mg/dL 9.3  9.3  8.7   Total  Protein 6.5 - 8.1 g/dL 6.3  6.4    Total Bilirubin 0.0 - 1.2 mg/dL 0.3  0.3    Alkaline Phos 38 - 126 U/L 92  103    AST 15 - 41 U/L 30  37    ALT 0 - 44 U/L 25  29      Lab Results  Component Value Date   MPROTEIN Not Observed 07/09/2024   MPROTEIN Not Observed 06/11/2024   MPROTEIN Not Observed 05/14/2024   Lab Results  Component Value Date   KPAFRELGTCHN <0.7 (L) 07/09/2024   KPAFRELGTCHN 0.8 (L) 06/11/2024   KPAFRELGTCHN <0.7 (L) 05/14/2024  LAMBDASER 6.3 07/09/2024   LAMBDASER <1.5 (L) 06/11/2024   LAMBDASER <1.5 (L) 05/14/2024   KAPLAMBRATIO <0.11 (L) 07/09/2024   KAPLAMBRATIO >0.53 06/11/2024   KAPLAMBRATIO UNABLE TO CALCULATE 05/14/2024     RADIOGRAPHIC STUDIES: CARDIAC CATHETERIZATION Result Date: 06/30/2024   Mid LAD lesion is 99% stenosed.   Ost LAD to Prox LAD lesion is 20% stenosed.   Prox Cx to Mid Cx lesion is 99% stenosed.   1st Mrg lesion is 30% stenosed.   Mid RCA lesion is 30% stenosed.   A drug-eluting stent was successfully placed using a STENT ONYX FRONTIER 2.5X30.   A drug-eluting stent was successfully placed using a STENT ONYX FRONTIER 2.5X15.   Post intervention, there is a 0% residual stenosis.   Post intervention, there is a 0% residual stenosis.   In the absence of any other complications or medical issues, we expect the patient to be ready for discharge from an interventional cardiology perspective on 07/01/2024.   Recommend uninterrupted dual antiplatelet therapy with Aspirin  81mg  daily and Clopidogrel  75mg  daily for a minimum of 6 months (stable ischemic heart disease-Class I recommendation). 1.  Severe two-vessel coronary artery disease involving mid LAD and mid left circumflex.  The coronary arteries are moderately calcified. 2.  Left ventricular angiography was not performed.  EF was normal by echo.  Normal left ventricular end-diastolic pressure. 3.  Successful angioplasty and drug-eluting stent placement to the mid LAD and mid left circumflex.  Recommendations: Observe overnight. Will add atorvastatin .    ASSESSMENT & PLAN MIASIA CRABTREE is a 79 y.o.  female with medical history significant for relapsed/refractory multiple myeloma who presents for a follow up visit.   # Relapsed/Refractory IgA Lambda Multiple Myeloma, In Remission -- Patient is undergone numerous prior therapies including autosomal stem cell transplant, allogenic stem cell transplant, CAR-T therapy, and now bispecific therapy --Patient completed her induction of bispecific therapy at [facility] -- Patient presents to continue maintenance therapy. PLAN: --Labs today show WBC 2.8, hemoglobin 10.6, MCV 101.9, platelets 127.  Creatinine and calcium  levels are normal.  --Most recent myeloma panel from 07/09/2024 showed M protein is not detectable.  Lambda light chains now detectable after long stretch of time being undetectable.  Will follow closely. --Cycle 9-day 15 of Elrexfio  today without any dose modifications.  --Plan for IVIG to be administered if IgG is less than 400. Last 422 on 07/09/2024.  Per patient request will schedule next dose of IVIG. -- Will plan for q 2 weekly maintenance Elrexfio  with clinic visits every treatment.   #Supportive Care -- zofran  8mg  q8H PRN and compazine  10mg  PO q6H for nausea -- acyclovir  400mg  PO BID for VCZ prophylaxis --Continue atovaquone  1500 mg twice daily -- IVIG to be administered when IgG are less than 400 -- EMLA  cream for port -- no pain medication required at this time.   No orders of the defined types were placed in this encounter.   All questions were answered. The patient knows to call the clinic with any problems, questions or concerns.   I have spent a total of 30 minutes minutes of face-to-face and non-face-to-face time, preparing to see the patient, performing a medically appropriate examination, counseling and educating the patient, documenting clinical information in the electronic health  record, independently interpreting results and communicating results to the patient, and care coordination.   Norleen IVAR Kidney, MD Department of Hematology/Oncology [facility] Phone: [phone] Pager: [phone] Email: [email or web address]  07/23/2024  9:54 AM  "

## 2024-07-23 ENCOUNTER — Inpatient Hospital Stay

## 2024-07-23 ENCOUNTER — Encounter: Payer: Self-pay | Admitting: Hematology and Oncology

## 2024-07-23 ENCOUNTER — Telehealth: Payer: Self-pay | Admitting: Hematology and Oncology

## 2024-07-23 ENCOUNTER — Inpatient Hospital Stay: Admitting: Hematology and Oncology

## 2024-07-23 VITALS — BP 122/74 | HR 85 | Temp 98.0°F | Resp 16 | Ht 63.0 in | Wt 151.3 lb

## 2024-07-23 DIAGNOSIS — R058 Other specified cough: Secondary | ICD-10-CM | POA: Diagnosis not present

## 2024-07-23 DIAGNOSIS — C9001 Multiple myeloma in remission: Secondary | ICD-10-CM | POA: Diagnosis not present

## 2024-07-23 DIAGNOSIS — D801 Nonfamilial hypogammaglobulinemia: Secondary | ICD-10-CM

## 2024-07-23 DIAGNOSIS — Z5111 Encounter for antineoplastic chemotherapy: Secondary | ICD-10-CM | POA: Diagnosis not present

## 2024-07-23 LAB — CMP (CANCER CENTER ONLY)
ALT: 25 U/L (ref 0–44)
AST: 30 U/L (ref 15–41)
Albumin: 4.3 g/dL (ref 3.5–5.0)
Alkaline Phosphatase: 92 U/L (ref 38–126)
Anion gap: 12 (ref 5–15)
BUN: 17 mg/dL (ref 8–23)
CO2: 24 mmol/L (ref 22–32)
Calcium: 9.3 mg/dL (ref 8.9–10.3)
Chloride: 106 mmol/L (ref 98–111)
Creatinine: 0.81 mg/dL (ref 0.44–1.00)
GFR, Estimated: >60 mL/min
Glucose, Bld: 112 mg/dL — ABNORMAL HIGH (ref 70–99)
Potassium: 3.7 mmol/L (ref 3.5–5.1)
Sodium: 142 mmol/L (ref 135–145)
Total Bilirubin: 0.3 mg/dL (ref 0.0–1.2)
Total Protein: 6.3 g/dL — ABNORMAL LOW (ref 6.5–8.1)

## 2024-07-23 LAB — CBC WITH DIFFERENTIAL (CANCER CENTER ONLY)
Abs Immature Granulocytes: 0.01 10*3/uL (ref 0.00–0.07)
Basophils Absolute: 0 10*3/uL (ref 0.0–0.1)
Basophils Relative: 0 %
Eosinophils Absolute: 0 10*3/uL (ref 0.0–0.5)
Eosinophils Relative: 0 %
HCT: 32.2 % — ABNORMAL LOW (ref 36.0–46.0)
Hemoglobin: 10.6 g/dL — ABNORMAL LOW (ref 12.0–15.0)
Immature Granulocytes: 0 %
Lymphocytes Relative: 44 %
Lymphs Abs: 1.2 10*3/uL (ref 0.7–4.0)
MCH: 33.5 pg (ref 26.0–34.0)
MCHC: 32.9 g/dL (ref 30.0–36.0)
MCV: 101.9 fL — ABNORMAL HIGH (ref 80.0–100.0)
Monocytes Absolute: 0.4 10*3/uL (ref 0.1–1.0)
Monocytes Relative: 14 %
Neutro Abs: 1.2 10*3/uL — ABNORMAL LOW (ref 1.7–7.7)
Neutrophils Relative %: 42 %
Platelet Count: 127 10*3/uL — ABNORMAL LOW (ref 150–400)
RBC: 3.16 MIL/uL — ABNORMAL LOW (ref 3.87–5.11)
RDW: 15 % (ref 11.5–15.5)
WBC Count: 2.8 10*3/uL — ABNORMAL LOW (ref 4.0–10.5)
nRBC: 0 % (ref 0.0–0.2)

## 2024-07-23 LAB — LACTATE DEHYDROGENASE: LDH: 211 U/L (ref 105–235)

## 2024-07-23 LAB — MAGNESIUM: Magnesium: 1.9 mg/dL (ref 1.7–2.4)

## 2024-07-23 MED ORDER — ELRANATAMAB-BCMM 76 MG/1.9ML ~~LOC~~ SOLN
76.0000 mg | Freq: Once | SUBCUTANEOUS | Status: AC
  Administered 2024-07-23: 76 mg via SUBCUTANEOUS
  Filled 2024-07-23: qty 1.9

## 2024-07-23 NOTE — Telephone Encounter (Signed)
 Called pt to inquire about moving lab appt to different day

## 2024-07-23 NOTE — Telephone Encounter (Signed)
 Called pt to give update on scheduled appts

## 2024-07-28 ENCOUNTER — Encounter

## 2024-07-30 ENCOUNTER — Encounter: Admitting: Emergency Medicine

## 2024-07-30 DIAGNOSIS — Z955 Presence of coronary angioplasty implant and graft: Secondary | ICD-10-CM

## 2024-07-30 NOTE — Progress Notes (Signed)
 Daily Session Note  Patient Details  Name: Jean Davidson MRN: [id number] Date of Birth: 1946-01-14 Referring Provider:   Flowsheet Row Cardiac Rehab from 07/17/2024 in [facility]  Referring Provider Dr. Deatrice Cage    Encounter Date: 07/30/2024  Check In:  Session Check In - 07/30/24 1108       Check-In   Supervising physician immediately available to respond to emergencies See telemetry face sheet for immediately available ER MD    Location [facility]-Cardiac & Pulmonary Rehab    Staff Present Leita Franks RN,BSN;[facility] BS, Exercise Physiologist;Margaret Best, MS, Exercise Physiologist    Virtual Visit No    Medication changes reported     No    Fall or balance concerns reported    No    Warm-up and Cool-down Performed on first and last piece of equipment    Resistance Training Performed Yes    VAD Patient? No    PAD/SET Patient? No      Pain Assessment   Currently in Pain? No/denies             Tobacco Use History[1]  Goals Met:  Independence with exercise equipment Exercise tolerated well No report of concerns or symptoms today Strength training completed today  Goals Unmet:  Not Applicable  Comments: First full day of exercise!  Patient was oriented to gym and equipment including functions, settings, policies, and procedures.  Patient's individual exercise prescription and treatment plan were reviewed.  All starting workloads were established based on the results of the 6 minute walk test done at initial orientation visit.  The plan for exercise progression was also introduced and progression will be customized based on patient's performance and goals.    Dr. Oneil Pinal is Medical Director for [facility] Cardiac Rehabilitation.  Dr. Fuad Aleskerov is Medical Director for [facility] Pulmonary Rehabilitation.    [1]  Social History Tobacco Use  Smoking Status Former   Current packs/day: 0.00   Average  packs/day: 0.5 packs/day for 4.0 years (2.0 ttl pk-yrs)   Types: Cigarettes   Start date: 06/26/1964   Quit date: 06/26/1968   Years since quitting: 56.1  Smokeless Tobacco Never

## 2024-08-01 ENCOUNTER — Other Ambulatory Visit: Payer: Self-pay | Admitting: Hematology and Oncology

## 2024-08-01 ENCOUNTER — Inpatient Hospital Stay

## 2024-08-01 ENCOUNTER — Inpatient Hospital Stay: Attending: Hematology and Oncology

## 2024-08-01 ENCOUNTER — Encounter

## 2024-08-01 VITALS — BP 136/77 | HR 72 | Temp 98.5°F | Resp 18

## 2024-08-01 DIAGNOSIS — C9001 Multiple myeloma in remission: Secondary | ICD-10-CM

## 2024-08-01 DIAGNOSIS — D801 Nonfamilial hypogammaglobulinemia: Secondary | ICD-10-CM

## 2024-08-01 LAB — CBC WITH DIFFERENTIAL (CANCER CENTER ONLY)
Abs Immature Granulocytes: 0.02 10*3/uL (ref 0.00–0.07)
Basophils Absolute: 0 10*3/uL (ref 0.0–0.1)
Basophils Relative: 0 %
Eosinophils Absolute: 0 10*3/uL (ref 0.0–0.5)
Eosinophils Relative: 0 %
HCT: 34.6 % — ABNORMAL LOW (ref 36.0–46.0)
Hemoglobin: 11.3 g/dL — ABNORMAL LOW (ref 12.0–15.0)
Immature Granulocytes: 1 %
Lymphocytes Relative: 38 %
Lymphs Abs: 1.3 10*3/uL (ref 0.7–4.0)
MCH: 33.3 pg (ref 26.0–34.0)
MCHC: 32.7 g/dL (ref 30.0–36.0)
MCV: 102.1 fL — ABNORMAL HIGH (ref 80.0–100.0)
Monocytes Absolute: 0.4 10*3/uL (ref 0.1–1.0)
Monocytes Relative: 13 %
Neutro Abs: 1.6 10*3/uL — ABNORMAL LOW (ref 1.7–7.7)
Neutrophils Relative %: 48 %
Platelet Count: 120 10*3/uL — ABNORMAL LOW (ref 150–400)
RBC: 3.39 MIL/uL — ABNORMAL LOW (ref 3.87–5.11)
RDW: 14.3 % (ref 11.5–15.5)
WBC Count: 3.3 10*3/uL — ABNORMAL LOW (ref 4.0–10.5)
nRBC: 0 % (ref 0.0–0.2)

## 2024-08-01 LAB — CMP (CANCER CENTER ONLY)
ALT: 30 U/L (ref 0–44)
AST: 33 U/L (ref 15–41)
Albumin: 4.3 g/dL (ref 3.5–5.0)
Alkaline Phosphatase: 112 U/L (ref 38–126)
Anion gap: 12 (ref 5–15)
BUN: 13 mg/dL (ref 8–23)
CO2: 26 mmol/L (ref 22–32)
Calcium: 9.1 mg/dL (ref 8.9–10.3)
Chloride: 105 mmol/L (ref 98–111)
Creatinine: 0.75 mg/dL (ref 0.44–1.00)
GFR, Estimated: >60 mL/min
Glucose, Bld: 95 mg/dL (ref 70–99)
Potassium: 3.7 mmol/L (ref 3.5–5.1)
Sodium: 142 mmol/L (ref 135–145)
Total Bilirubin: 0.3 mg/dL (ref 0.0–1.2)
Total Protein: 6.4 g/dL — ABNORMAL LOW (ref 6.5–8.1)

## 2024-08-01 LAB — LACTATE DEHYDROGENASE: LDH: 234 U/L (ref 105–235)

## 2024-08-01 MED ORDER — IMMUNE GLOBULIN (HUMAN) 10 GM/100ML IV SOLN
400.0000 mg/kg | Freq: Once | INTRAVENOUS | Status: AC
  Administered 2024-08-01: 45 g via INTRAVENOUS
  Filled 2024-08-01: qty 200

## 2024-08-01 MED ORDER — DEXTROSE 5 % IV SOLN: INTRAVENOUS | Status: DC

## 2024-08-01 MED ORDER — DIPHENHYDRAMINE HCL 25 MG PO CAPS
25.0000 mg | ORAL_CAPSULE | Freq: Once | ORAL | Status: AC
  Administered 2024-08-01: 25 mg via ORAL
  Filled 2024-08-01: qty 1

## 2024-08-01 MED ORDER — ACETAMINOPHEN 325 MG PO TABS
650.0000 mg | ORAL_TABLET | Freq: Once | ORAL | Status: AC
  Administered 2024-08-01: 650 mg via ORAL
  Filled 2024-08-01: qty 2

## 2024-08-01 NOTE — Patient Instructions (Signed)
 Immune Globulin  Injection What is this medication? IMMUNE GLOBULIN  (im MUNE GLOB yoo lin) treats many immune system conditions. It works by Designer, multimedia extra antibodies. Antibodies are proteins made by the immune system that help protect the body. This medicine may be used for other purposes; ask your health care provider or pharmacist if you have questions. COMMON BRAND NAME(S): ASCENIV, Baygam, BIVIGAM, Carimune, Carimune NF, cutaquig, Cuvitru, Flebogamma, Flebogamma DIF, GamaSTAN, GamaSTAN S/D, Gamimune N, Gammagard, Gammagard S/D, Gammaked, Gammaplex, Gammar-P IV, Gamunex, Gamunex-C, Hizentra, Iveegam, Iveegam EN, Octagam, Panglobulin, Panglobulin NF, panzyga, Polygam S/D, Privigen , Sandoglobulin, Venoglobulin-S, Vigam, Vivaglobulin, Xembify What should I tell my care team before I take this medication? They need to know if you have any of these conditions: Blood clotting disorder Condition where you have excess fluid in your body, such as heart failure or edema Dehydration Diabetes Have had blood clots Heart disease Immune system conditions Kidney disease Low levels of IgA Recent or upcoming vaccine An unusual or allergic reaction to immune globulin , other medications, foods, dyes, or preservatives Pregnant or trying to get pregnant Breastfeeding How should I use this medication? This medication is infused into a vein or under the skin. It may also be injected into a muscle. It is usually given by your care team in a hospital or clinic setting. It may also be given at home. If you get this medication at home, you will be taught how to prepare and give it. Take it as directed on the prescription label. Keep taking it unless your care team tells you to stop. It is important that you put your used needles and syringes in a special sharps container. Do not put them in a trash can. If you do not have a sharps container, call your pharmacist or care team to get one. Talk to your care team  about the use of this medication in children. While it may be given to children for selected conditions, precautions do apply. Overdosage: If you think you have taken too much of this medicine contact a poison control center or emergency room at once. NOTE: This medicine is only for you. Do not share this medicine with others. What if I miss a dose? If you get this medication at the hospital or clinic: It is important not to miss your dose. Call your care team if you are unable to keep an appointment. If you give yourself this medication at home: If you miss a dose, take it as soon as you can. Then continue your normal schedule. If it is almost time for your next dose, take only that dose. Do not take double or extra doses. Call your care team with questions. What may interact with this medication? Live virus vaccines This list may not describe all possible interactions. Give your health care provider a list of all the medicines, herbs, non-prescription drugs, or dietary supplements you use. Also tell them if you smoke, drink alcohol , or use illegal drugs. Some items may interact with your medicine. What should I watch for while using this medication? Your condition will be monitored carefully while you are receiving this medication. Tell your care team if your symptoms do not start to get better or if they get worse. You may need blood work done while you are taking this medication. This medication increases the risk of blood clots. People with heart, blood vessel, or blood clotting conditions are more likely to develop a blood clot. Other risk factors include advanced age, estrogen use, tobacco  use, lack of movement, and being overweight. This medication can decrease the response to a vaccine. If you need to get vaccinated, tell your care team if you have received this medication within the last year. Extra booster doses may be needed. Talk to your care team to see if a different vaccination schedule  is needed. This medication is made from donated human blood. There is a small risk it may contain bacteria or viruses, such as hepatitis or HIV. All products are processed to kill most bacteria and viruses. Talk to your care team if you have questions about the risk of infection. If you have diabetes, talk to your care team about which device you should use to check your blood sugar. This medication may cause some devices to report falsely high blood sugar levels. This may cause you to react by not treating a low blood sugar level or by giving an insulin  dose that was not needed. This can cause severe low blood sugar levels. What side effects may I notice from receiving this medication? Side effects that you should report to your care team as soon as possible: Allergic reactions--skin rash, itching, hives, swelling of the face, lips, tongue, or throat Blood clot--pain, swelling, or warmth in the leg, shortness of breath, chest pain Fever, neck pain or stiffness, sensitivity to light, headache, nausea, vomiting, confusion, which may be signs of meningitis Hemolytic anemia--unusual weakness or fatigue, dizziness, headache, trouble breathing, dark urine, yellowing skin or eyes Kidney injury--decrease in the amount of urine, swelling of the ankles, hands, or feet Low sodium level--muscle weakness, fatigue, dizziness, headache, confusion Shortness of breath or trouble breathing, cough, unusual weakness or fatigue, blue skin or lips Side effects that usually do not require medical attention (report these to your care team if they continue or are bothersome): Chills Diarrhea Fever Headache Nausea This list may not describe all possible side effects. Call your doctor for medical advice about side effects. You may report side effects to FDA at 1-800-FDA-1088. Where should I keep my medication? Keep out of the reach of children and pets. You will be instructed on how to store this medication. Get rid of  any unused medication after the expiration date. To get rid of medications that are no longer needed or have expired: Take the medication to a medication take-back program. Check with your pharmacy or law enforcement to find a location. If you cannot return the medication, ask your pharmacist or care team how to get rid of this medication safely. NOTE: This sheet is a summary. It may not cover all possible information. If you have questions about this medicine, talk to your doctor, pharmacist, or health care provider.  2025 [employer] Standard (2023-08-27 00:00:00)

## 2024-08-04 ENCOUNTER — Encounter

## 2024-08-05 ENCOUNTER — Ambulatory Visit: Admitting: Cardiovascular Disease

## 2024-08-05 ENCOUNTER — Inpatient Hospital Stay

## 2024-08-06 ENCOUNTER — Inpatient Hospital Stay: Admitting: Physician Assistant

## 2024-08-06 ENCOUNTER — Inpatient Hospital Stay

## 2024-08-08 ENCOUNTER — Encounter

## 2024-08-11 ENCOUNTER — Encounter

## 2024-08-13 ENCOUNTER — Encounter

## 2024-08-14 ENCOUNTER — Inpatient Hospital Stay

## 2024-08-15 ENCOUNTER — Encounter

## 2024-08-18 ENCOUNTER — Encounter

## 2024-08-20 ENCOUNTER — Inpatient Hospital Stay

## 2024-08-20 ENCOUNTER — Inpatient Hospital Stay: Admitting: Physician Assistant

## 2024-08-22 ENCOUNTER — Encounter

## 2024-08-25 ENCOUNTER — Encounter

## 2024-08-27 ENCOUNTER — Encounter

## 2024-08-28 ENCOUNTER — Inpatient Hospital Stay

## 2024-08-29 ENCOUNTER — Encounter

## 2024-08-29 ENCOUNTER — Ambulatory Visit: Admitting: Family Medicine

## 2024-09-01 ENCOUNTER — Encounter

## 2024-09-01 ENCOUNTER — Ambulatory Visit: Admitting: Physician Assistant

## 2024-09-03 ENCOUNTER — Inpatient Hospital Stay

## 2024-09-03 ENCOUNTER — Inpatient Hospital Stay: Admitting: Physician Assistant

## 2024-09-03 ENCOUNTER — Inpatient Hospital Stay: Attending: Hematology and Oncology

## 2024-09-05 ENCOUNTER — Encounter

## 2024-09-08 ENCOUNTER — Encounter

## 2024-09-10 ENCOUNTER — Encounter

## 2024-09-11 ENCOUNTER — Inpatient Hospital Stay

## 2024-09-11 ENCOUNTER — Ambulatory Visit: Admitting: Medical

## 2024-09-12 ENCOUNTER — Encounter

## 2024-09-15 ENCOUNTER — Encounter

## 2024-09-19 ENCOUNTER — Encounter

## 2024-09-22 ENCOUNTER — Encounter

## 2024-09-24 ENCOUNTER — Encounter

## 2024-09-26 ENCOUNTER — Encounter

## 2024-09-29 ENCOUNTER — Encounter

## 2024-10-03 ENCOUNTER — Encounter

## 2024-10-06 ENCOUNTER — Encounter

## 2024-10-08 ENCOUNTER — Encounter

## 2024-10-10 ENCOUNTER — Encounter

## 2024-10-13 ENCOUNTER — Encounter
